# Patient Record
Sex: Female | Born: 1940 | Race: White | Hispanic: No | Smoking: Former smoker
Health system: Southern US, Community
[De-identification: ages and names within clinical notes are randomized; demographics above are authoritative.]

## PROBLEM LIST (undated history)

## (undated) DIAGNOSIS — I739 Peripheral vascular disease, unspecified: Secondary | ICD-10-CM

## (undated) DIAGNOSIS — J449 Chronic obstructive pulmonary disease, unspecified: Secondary | ICD-10-CM

## (undated) DIAGNOSIS — K5730 Diverticulosis of large intestine without perforation or abscess without bleeding: Secondary | ICD-10-CM

## (undated) DIAGNOSIS — K219 Gastro-esophageal reflux disease without esophagitis: Secondary | ICD-10-CM

## (undated) DIAGNOSIS — C50919 Malignant neoplasm of unspecified site of unspecified female breast: Secondary | ICD-10-CM

## (undated) DIAGNOSIS — I779 Disorder of arteries and arterioles, unspecified: Secondary | ICD-10-CM

## (undated) DIAGNOSIS — I509 Heart failure, unspecified: Secondary | ICD-10-CM

## (undated) DIAGNOSIS — Z803 Family history of malignant neoplasm of breast: Secondary | ICD-10-CM

## (undated) DIAGNOSIS — Z807 Family history of other malignant neoplasms of lymphoid, hematopoietic and related tissues: Secondary | ICD-10-CM

## (undated) DIAGNOSIS — Z5189 Encounter for other specified aftercare: Secondary | ICD-10-CM

## (undated) DIAGNOSIS — I471 Supraventricular tachycardia, unspecified: Secondary | ICD-10-CM

## (undated) DIAGNOSIS — Z801 Family history of malignant neoplasm of trachea, bronchus and lung: Secondary | ICD-10-CM

## (undated) DIAGNOSIS — R42 Dizziness and giddiness: Secondary | ICD-10-CM

## (undated) DIAGNOSIS — E785 Hyperlipidemia, unspecified: Secondary | ICD-10-CM

## (undated) DIAGNOSIS — F411 Generalized anxiety disorder: Secondary | ICD-10-CM

## (undated) DIAGNOSIS — Z800 Family history of malignant neoplasm of digestive organs: Secondary | ICD-10-CM

## (undated) DIAGNOSIS — M19049 Primary osteoarthritis, unspecified hand: Secondary | ICD-10-CM

## (undated) DIAGNOSIS — J439 Emphysema, unspecified: Secondary | ICD-10-CM

## (undated) DIAGNOSIS — J4489 Other specified chronic obstructive pulmonary disease: Secondary | ICD-10-CM

## (undated) DIAGNOSIS — Z923 Personal history of irradiation: Secondary | ICD-10-CM

## (undated) DIAGNOSIS — Z8719 Personal history of other diseases of the digestive system: Secondary | ICD-10-CM

## (undated) DIAGNOSIS — H269 Unspecified cataract: Secondary | ICD-10-CM

## (undated) DIAGNOSIS — Z8049 Family history of malignant neoplasm of other genital organs: Secondary | ICD-10-CM

## (undated) DIAGNOSIS — D649 Anemia, unspecified: Secondary | ICD-10-CM

## (undated) HISTORY — PX: OTHER SURGICAL HISTORY: SHX169

## (undated) HISTORY — DX: Family history of other malignant neoplasms of lymphoid, hematopoietic and related tissues: Z80.7

## (undated) HISTORY — PX: BREAST SURGERY: SHX581

## (undated) HISTORY — DX: Disorder of arteries and arterioles, unspecified: I77.9

## (undated) HISTORY — DX: Supraventricular tachycardia, unspecified: I47.10

## (undated) HISTORY — DX: Personal history of other diseases of the digestive system: Z87.19

## (undated) HISTORY — DX: Hyperlipidemia, unspecified: E78.5

## (undated) HISTORY — DX: Dizziness and giddiness: R42

## (undated) HISTORY — DX: Family history of malignant neoplasm of digestive organs: Z80.0

## (undated) HISTORY — DX: Malignant neoplasm of unspecified site of unspecified female breast: C50.919

## (undated) HISTORY — PX: ABDOMINAL HYSTERECTOMY: SHX81

## (undated) HISTORY — DX: Family history of malignant neoplasm of trachea, bronchus and lung: Z80.1

## (undated) HISTORY — DX: Heart failure, unspecified: I50.9

## (undated) HISTORY — DX: Other specified chronic obstructive pulmonary disease: J44.89

## (undated) HISTORY — PX: TONGUE SURGERY: SHX810

## (undated) HISTORY — DX: Unspecified cataract: H26.9

## (undated) HISTORY — DX: Personal history of irradiation: Z92.3

## (undated) HISTORY — PX: COSMETIC SURGERY: SHX468

## (undated) HISTORY — DX: Family history of malignant neoplasm of other genital organs: Z80.49

## (undated) HISTORY — DX: Generalized anxiety disorder: F41.1

## (undated) HISTORY — PX: CATARACT EXTRACTION: SUR2

## (undated) HISTORY — PX: APPENDECTOMY: SHX54

## (undated) HISTORY — DX: Diverticulosis of large intestine without perforation or abscess without bleeding: K57.30

## (undated) HISTORY — DX: Peripheral vascular disease, unspecified: I73.9

## (undated) HISTORY — DX: Chronic obstructive pulmonary disease, unspecified: J44.9

## (undated) HISTORY — DX: Emphysema, unspecified: J43.9

## (undated) HISTORY — DX: Gastro-esophageal reflux disease without esophagitis: K21.9

## (undated) HISTORY — DX: Encounter for other specified aftercare: Z51.89

## (undated) HISTORY — PX: HERNIA REPAIR: SHX51

## (undated) HISTORY — DX: Supraventricular tachycardia: I47.1

## (undated) HISTORY — DX: Anemia, unspecified: D64.9

## (undated) HISTORY — DX: Primary osteoarthritis, unspecified hand: M19.049

## (undated) HISTORY — DX: Family history of malignant neoplasm of breast: Z80.3

## (undated) HISTORY — PX: TONSILLECTOMY: SUR1361

## (undated) MED FILL — Sodium Chloride IV Soln 0.9%: INTRAVENOUS | Qty: 250 | Status: AC

## (undated) MED FILL — Magnesium Sulfate IV Soln 4 GM/100ML (40 MG/ML): INTRAVENOUS | Qty: 100 | Status: AC

---

## 1982-02-02 HISTORY — PX: OTHER SURGICAL HISTORY: SHX169

## 1987-02-03 HISTORY — PX: TMJ ARTHROPLASTY: SHX1066

## 1995-02-03 HISTORY — PX: OTHER SURGICAL HISTORY: SHX169

## 1999-01-14 ENCOUNTER — Other Ambulatory Visit: Admission: RE | Admit: 1999-01-14 | Discharge: 1999-01-14 | Payer: Self-pay | Admitting: Family Medicine

## 2000-06-03 ENCOUNTER — Other Ambulatory Visit: Admission: RE | Admit: 2000-06-03 | Discharge: 2000-06-03 | Payer: Self-pay | Admitting: Family Medicine

## 2000-07-07 ENCOUNTER — Encounter: Payer: Self-pay | Admitting: Gastroenterology

## 2001-02-02 HISTORY — PX: OTHER SURGICAL HISTORY: SHX169

## 2001-11-07 ENCOUNTER — Encounter: Payer: Self-pay | Admitting: Surgery

## 2001-11-07 ENCOUNTER — Encounter: Admission: RE | Admit: 2001-11-07 | Discharge: 2001-11-07 | Payer: Self-pay | Admitting: Surgery

## 2001-11-07 ENCOUNTER — Encounter (INDEPENDENT_AMBULATORY_CARE_PROVIDER_SITE_OTHER): Payer: Self-pay | Admitting: *Deleted

## 2001-11-14 ENCOUNTER — Encounter: Payer: Self-pay | Admitting: Surgery

## 2001-11-14 ENCOUNTER — Ambulatory Visit (HOSPITAL_BASED_OUTPATIENT_CLINIC_OR_DEPARTMENT_OTHER): Admission: RE | Admit: 2001-11-14 | Discharge: 2001-11-14 | Payer: Self-pay | Admitting: Surgery

## 2001-11-14 ENCOUNTER — Encounter: Admission: RE | Admit: 2001-11-14 | Discharge: 2001-11-14 | Payer: Self-pay | Admitting: Surgery

## 2001-11-15 ENCOUNTER — Encounter (INDEPENDENT_AMBULATORY_CARE_PROVIDER_SITE_OTHER): Payer: Self-pay | Admitting: *Deleted

## 2002-11-17 ENCOUNTER — Other Ambulatory Visit: Admission: RE | Admit: 2002-11-17 | Discharge: 2002-11-17 | Payer: Self-pay | Admitting: Obstetrics & Gynecology

## 2003-11-12 ENCOUNTER — Encounter: Payer: Self-pay | Admitting: Gastroenterology

## 2003-12-21 ENCOUNTER — Other Ambulatory Visit: Admission: RE | Admit: 2003-12-21 | Discharge: 2003-12-21 | Payer: Self-pay | Admitting: Obstetrics & Gynecology

## 2005-02-12 ENCOUNTER — Other Ambulatory Visit: Admission: RE | Admit: 2005-02-12 | Discharge: 2005-02-12 | Payer: Self-pay | Admitting: Obstetrics & Gynecology

## 2006-01-20 ENCOUNTER — Ambulatory Visit: Payer: Self-pay | Admitting: Internal Medicine

## 2006-02-02 DIAGNOSIS — K5730 Diverticulosis of large intestine without perforation or abscess without bleeding: Secondary | ICD-10-CM

## 2006-02-02 HISTORY — DX: Diverticulosis of large intestine without perforation or abscess without bleeding: K57.30

## 2006-02-02 HISTORY — PX: OTHER SURGICAL HISTORY: SHX169

## 2006-06-08 ENCOUNTER — Ambulatory Visit: Payer: Self-pay | Admitting: Internal Medicine

## 2006-06-08 LAB — CONVERTED CEMR LAB
ALT: 17 units/L (ref 0–40)
AST: 23 units/L (ref 0–37)
Albumin: 3.6 g/dL (ref 3.5–5.2)
Alkaline Phosphatase: 39 units/L (ref 39–117)
BUN: 20 mg/dL (ref 6–23)
Basophils Absolute: 0.1 10*3/uL (ref 0.0–0.1)
Basophils Relative: 0.9 % (ref 0.0–1.0)
Bilirubin Urine: NEGATIVE
Bilirubin, Direct: 0.1 mg/dL (ref 0.0–0.3)
CO2: 28 meq/L (ref 19–32)
Calcium: 8.9 mg/dL (ref 8.4–10.5)
Chloride: 109 meq/L (ref 96–112)
Cholesterol: 197 mg/dL (ref 0–200)
Creatinine, Ser: 0.8 mg/dL (ref 0.4–1.2)
Crystals: NEGATIVE
Eosinophils Absolute: 0.6 10*3/uL (ref 0.0–0.6)
Eosinophils Relative: 8.3 % — ABNORMAL HIGH (ref 0.0–5.0)
GFR calc Af Amer: 92 mL/min
GFR calc non Af Amer: 76 mL/min
Glucose, Bld: 105 mg/dL — ABNORMAL HIGH (ref 70–99)
HCT: 39.4 % (ref 36.0–46.0)
HDL: 74.8 mg/dL (ref >39.0)
Hemoglobin, Urine: NEGATIVE
Hemoglobin: 13.5 g/dL (ref 12.0–15.0)
Ketones, ur: NEGATIVE mg/dL
LDL Cholesterol: 104 mg/dL — ABNORMAL HIGH (ref 0–99)
Lymphocytes Relative: 35.6 % (ref 12.0–46.0)
MCHC: 34.1 g/dL (ref 30.0–36.0)
MCV: 88.9 fL (ref 78.0–100.0)
Monocytes Absolute: 0.6 10*3/uL (ref 0.2–0.7)
Monocytes Relative: 8.3 % (ref 3.0–11.0)
Mucus, UA: NEGATIVE
Neutro Abs: 3.1 10*3/uL (ref 1.4–7.7)
Neutrophils Relative %: 46.9 % (ref 43.0–77.0)
Nitrite: NEGATIVE
Platelets: 354 10*3/uL (ref 150–400)
Potassium: 4 meq/L (ref 3.5–5.1)
RBC / HPF: NONE SEEN
RBC: 4.43 M/uL (ref 3.87–5.11)
RDW: 12.8 % (ref 11.5–14.6)
Sodium: 142 meq/L (ref 135–145)
Specific Gravity, Urine: 1.025 (ref 1.000–1.03)
TSH: 2.28 microintl units/mL (ref 0.35–5.50)
Total Bilirubin: 0.6 mg/dL (ref 0.3–1.2)
Total CHOL/HDL Ratio: 2.6
Total Protein, Urine: NEGATIVE mg/dL
Total Protein: 6.6 g/dL (ref 6.0–8.3)
Triglycerides: 93 mg/dL (ref 0–149)
Urine Glucose: NEGATIVE mg/dL
Urobilinogen, UA: 0.2 (ref 0.0–1.0)
VLDL: 19 mg/dL (ref 0–40)
Vit D, 1,25-Dihydroxy: 40 (ref 20–57)
WBC: 6.8 10*3/uL (ref 4.5–10.5)
pH: 6 (ref 5.0–8.0)

## 2006-12-17 ENCOUNTER — Ambulatory Visit: Payer: Self-pay | Admitting: Internal Medicine

## 2006-12-17 ENCOUNTER — Encounter: Payer: Self-pay | Admitting: Internal Medicine

## 2006-12-17 DIAGNOSIS — R05 Cough: Secondary | ICD-10-CM | POA: Insufficient documentation

## 2006-12-17 DIAGNOSIS — Z8719 Personal history of other diseases of the digestive system: Secondary | ICD-10-CM | POA: Insufficient documentation

## 2006-12-17 DIAGNOSIS — K5730 Diverticulosis of large intestine without perforation or abscess without bleeding: Secondary | ICD-10-CM | POA: Insufficient documentation

## 2006-12-17 DIAGNOSIS — D630 Anemia in neoplastic disease: Secondary | ICD-10-CM | POA: Insufficient documentation

## 2006-12-17 DIAGNOSIS — R059 Cough, unspecified: Secondary | ICD-10-CM | POA: Insufficient documentation

## 2006-12-17 DIAGNOSIS — D649 Anemia, unspecified: Secondary | ICD-10-CM | POA: Insufficient documentation

## 2006-12-23 ENCOUNTER — Ambulatory Visit: Payer: Self-pay

## 2007-02-03 HISTORY — PX: OTHER SURGICAL HISTORY: SHX169

## 2007-03-07 LAB — CONVERTED CEMR LAB: Pap Smear: NORMAL

## 2007-05-23 ENCOUNTER — Emergency Department (HOSPITAL_COMMUNITY): Admission: EM | Admit: 2007-05-23 | Discharge: 2007-05-23 | Payer: Self-pay | Admitting: Emergency Medicine

## 2007-06-14 ENCOUNTER — Ambulatory Visit: Payer: Self-pay | Admitting: Internal Medicine

## 2007-06-14 DIAGNOSIS — R5383 Other fatigue: Secondary | ICD-10-CM

## 2007-06-14 DIAGNOSIS — E785 Hyperlipidemia, unspecified: Secondary | ICD-10-CM | POA: Insufficient documentation

## 2007-06-14 DIAGNOSIS — R5381 Other malaise: Secondary | ICD-10-CM | POA: Insufficient documentation

## 2007-06-14 LAB — CONVERTED CEMR LAB
AST: 23 units/L (ref 0–37)
Alkaline Phosphatase: 45 units/L (ref 39–117)
Bilirubin Urine: NEGATIVE
Bilirubin, Direct: 0.1 mg/dL (ref 0.0–0.3)
Chloride: 110 meq/L (ref 96–112)
Eosinophils Absolute: 0.3 10*3/uL (ref 0.0–0.7)
GFR calc Af Amer: 92 mL/min
GFR calc non Af Amer: 76 mL/min
Glucose, Bld: 100 mg/dL — ABNORMAL HIGH (ref 70–99)
HCT: 40.3 % (ref 36.0–46.0)
HDL: 57.8 mg/dL (ref >39.0)
Hemoglobin, Urine: NEGATIVE
Monocytes Absolute: 0.5 10*3/uL (ref 0.1–1.0)
Monocytes Relative: 7.2 % (ref 3.0–12.0)
Neutrophils Relative %: 59.5 % (ref 43.0–77.0)
Nitrite: NEGATIVE
Platelets: 322 10*3/uL (ref 150–400)
Potassium: 4.2 meq/L (ref 3.5–5.1)
RDW: 12.4 % (ref 11.5–14.6)
Sodium: 143 meq/L (ref 135–145)
Total CHOL/HDL Ratio: 3.6
Total Protein, Urine: NEGATIVE mg/dL
Triglycerides: 170 mg/dL — ABNORMAL HIGH (ref 0–149)
Urobilinogen, UA: 0.2 (ref 0.0–1.0)
VLDL: 34 mg/dL (ref 0–40)
WBC: 6.5 10*3/uL (ref 4.5–10.5)

## 2007-12-03 ENCOUNTER — Telehealth: Payer: Self-pay | Admitting: Family Medicine

## 2008-06-14 ENCOUNTER — Ambulatory Visit: Payer: Self-pay | Admitting: Internal Medicine

## 2008-06-14 DIAGNOSIS — J449 Chronic obstructive pulmonary disease, unspecified: Secondary | ICD-10-CM

## 2008-06-14 DIAGNOSIS — J4489 Other specified chronic obstructive pulmonary disease: Secondary | ICD-10-CM | POA: Insufficient documentation

## 2008-06-14 LAB — CONVERTED CEMR LAB
ALT: 18 units/L (ref 0–35)
Alkaline Phosphatase: 60 units/L (ref 39–117)
Basophils Relative: 0.6 % (ref 0.0–3.0)
Bilirubin, Direct: 0.1 mg/dL (ref 0.0–0.3)
CO2: 30 meq/L (ref 19–32)
Eosinophils Relative: 2.9 % (ref 0.0–5.0)
Glucose, Bld: 102 mg/dL — ABNORMAL HIGH (ref 70–99)
HDL: 66.1 mg/dL (ref >39.00)
Hemoglobin, Urine: NEGATIVE
Ketones, ur: NEGATIVE mg/dL
LDL Cholesterol: 102 mg/dL — ABNORMAL HIGH (ref 0–99)
Leukocytes, UA: NEGATIVE
Lymphocytes Relative: 30 % (ref 12.0–46.0)
Neutrophils Relative %: 60 % (ref 43.0–77.0)
Platelets: 284 10*3/uL (ref 150.0–400.0)
Potassium: 4.6 meq/L (ref 3.5–5.1)
RBC: 4.32 M/uL (ref 3.87–5.11)
Sodium: 141 meq/L (ref 135–145)
Specific Gravity, Urine: 1.02 (ref 1.000–1.030)
TSH: 1.02 microintl units/mL (ref 0.35–5.50)
Total Bilirubin: 0.5 mg/dL (ref 0.3–1.2)
Total CHOL/HDL Ratio: 3
Total Protein: 6.9 g/dL (ref 6.0–8.3)
Triglycerides: 120 mg/dL (ref 0.0–149.0)
Urine Glucose: NEGATIVE mg/dL
Urobilinogen, UA: 0.2 (ref 0.0–1.0)

## 2008-06-15 LAB — CONVERTED CEMR LAB: Vit D, 25-Hydroxy: 49 ng/mL

## 2008-10-23 ENCOUNTER — Ambulatory Visit: Payer: Self-pay | Admitting: Internal Medicine

## 2008-10-26 ENCOUNTER — Encounter: Payer: Self-pay | Admitting: Internal Medicine

## 2008-11-05 ENCOUNTER — Encounter: Payer: Self-pay | Admitting: Internal Medicine

## 2009-05-01 ENCOUNTER — Telehealth: Payer: Self-pay | Admitting: Internal Medicine

## 2009-05-03 ENCOUNTER — Ambulatory Visit: Payer: Self-pay | Admitting: Internal Medicine

## 2009-05-03 DIAGNOSIS — F411 Generalized anxiety disorder: Secondary | ICD-10-CM | POA: Insufficient documentation

## 2009-06-26 ENCOUNTER — Ambulatory Visit: Payer: Self-pay | Admitting: Internal Medicine

## 2009-06-26 DIAGNOSIS — M19049 Primary osteoarthritis, unspecified hand: Secondary | ICD-10-CM

## 2009-07-04 ENCOUNTER — Ambulatory Visit: Payer: Self-pay | Admitting: Internal Medicine

## 2009-07-04 LAB — CONVERTED CEMR LAB
AST: 21 units/L (ref 0–37)
Albumin: 3.6 g/dL (ref 3.5–5.2)
Alkaline Phosphatase: 45 units/L (ref 39–117)
Basophils Absolute: 0 10*3/uL (ref 0.0–0.1)
Bilirubin Urine: NEGATIVE
Bilirubin, Direct: 0 mg/dL (ref 0.0–0.3)
Calcium: 8.6 mg/dL (ref 8.4–10.5)
Eosinophils Absolute: 0.2 10*3/uL (ref 0.0–0.7)
GFR calc non Af Amer: 94.3 mL/min (ref >60)
HCT: 36.7 % (ref 36.0–46.0)
HDL: 72.6 mg/dL (ref >39.00)
Hemoglobin, Urine: NEGATIVE
Ketones, ur: NEGATIVE mg/dL
Leukocytes, UA: NEGATIVE
Lymphs Abs: 2.4 10*3/uL (ref 0.7–4.0)
MCV: 89.8 fL (ref 78.0–100.0)
Monocytes Absolute: 0.5 10*3/uL (ref 0.1–1.0)
Neutrophils Relative %: 48 % (ref 43.0–77.0)
Platelets: 288 10*3/uL (ref 150.0–400.0)
Potassium: 4.3 meq/L (ref 3.5–5.1)
RDW: 13.9 % (ref 11.5–14.6)
Sodium: 145 meq/L (ref 135–145)
Total Bilirubin: 0.3 mg/dL (ref 0.3–1.2)
Total CHOL/HDL Ratio: 3
Triglycerides: 132 mg/dL (ref 0.0–149.0)
VLDL: 26.4 mg/dL (ref 0.0–40.0)
WBC: 6.1 10*3/uL (ref 4.5–10.5)
pH: 7 (ref 5.0–8.0)

## 2009-07-05 ENCOUNTER — Encounter: Payer: Self-pay | Admitting: Internal Medicine

## 2009-07-08 ENCOUNTER — Encounter: Payer: Self-pay | Admitting: Internal Medicine

## 2009-07-08 ENCOUNTER — Telehealth: Payer: Self-pay | Admitting: Internal Medicine

## 2009-10-01 ENCOUNTER — Encounter: Payer: Self-pay | Admitting: Internal Medicine

## 2009-11-06 ENCOUNTER — Encounter: Payer: Self-pay | Admitting: Internal Medicine

## 2009-11-25 ENCOUNTER — Encounter: Payer: Self-pay | Admitting: Internal Medicine

## 2010-01-16 ENCOUNTER — Telehealth: Payer: Self-pay | Admitting: Gastroenterology

## 2010-03-04 NOTE — Letter (Signed)
Summary: [facility] [address]  [facility]   Imported By: Sherian Rein 10/08/2009 10:34:52  _____________________________________________________________________  External Attachment:    Type:   Image     Comment:   External Document

## 2010-03-04 NOTE — Medication Information (Signed)
Summary: Celebrex Approved/[employer]  Celebrex Approved/[employer]   Imported By: Sherian Rein 07/10/2009 10:55:21  _____________________________________________________________________  External Attachment:    Type:   Image     Comment:   External Document

## 2010-03-04 NOTE — Assessment & Plan Note (Signed)
Summary: yearly f/u ///MEDICARE/#/CD   Vital Signs:  Patient profile:   70 year old female Height:      63 inches Weight:      141.25 pounds BMI:     25.11 O2 Sat:      98 % on Room air Temp:     98.2 degrees F oral Pulse rate:   80 / minute BP sitting:   120 / 60  (left arm) Cuff size:   regular  Vitals Entered ByZella Ball Ewing (Jun 26, 2009 1:20 PM)  O2 Flow:  Room air  Preventive Care Screening  Last Tetanus Booster:    Date:  06/14/2008    Results:  Tdap      dxa per Dr neal/gyn; does not want colonoscpy now, but will after mother's illness (pancreatic cancer)  CC: Yearly/RE   CC:  Yearly/RE.  History of Present Illness: overall doing well, no specific complaint except for hand DJD pain without soft tissue swelling;  Pt denies CP, sob, doe, wheezing, orthopnea, pnd, worsening LE edema, palps, dizziness or syncope   Pt denies new neuro symptoms such as headache, facial or extremity weakness   Wants to blood work later.  Preventive Screening-Counseling & Management      Drug Use:  no.    Problems Prior to Update: 1)  Anxiety  (ICD-300.00) 2)  COPD  (ICD-496) 3)  Dyspnea  (ICD-786.05) 4)  Preventive Health Care  (ICD-V70.0) 5)  Preventive Health Care  (ICD-V70.0) 6)  Hyperlipidemia  (ICD-272.4) 7)  Fatigue  (ICD-780.79) 8)  Anemia-nos  (ICD-285.9) 9)  Diverticulosis, Colon  (ICD-562.10) 10)  Irritable Bowel Syndrome, Hx of  (ICD-V12.79) 11)  Cough  (ICD-786.2) 12)  Pre-operative Cardiovascular Examination  (ICD-V72.81)  Medications Prior to Update: 1)  Evista 60 Mg  Tabs (Raloxifene Hcl) .Marland Kitchen.. 1 By Mouth Once Daily 2)  Omeprazole 20 Mg  Cpdr (Omeprazole) .Marland Kitchen.. 1po Once Daily 3)  Calcium 600/vitamin D 600-400 Mg-Unit  Chew (Calcium Carbonate-Vitamin D) .Marland Kitchen.. 1 By Mouth Qd 4)  Ecotrin Low Strength 81 Mg  Tbec (Aspirin) .Marland Kitchen.. 1 By Mouth Qd 5)  Daily Multi   Tabs (Multiple Vitamins-Minerals) .Marland Kitchen.. 1 By Mouth Qd 6)  Miralax   Powd (Polyethylene Glycol 3350) .... Use  As Directed 7)  Fluticasone Propionate 50 Mcg/act  Susp (Fluticasone Propionate) .... 2 Spray/side Once Daily 8)  Ambien 10 Mg Tabs (Zolpidem Tartrate) .Marland Kitchen.. 1 At Bedtime As Needed  -  Please Make Return Office Visit For Further Refills 9)  Epipen 2-Pak 0.3 Mg/0.14ml (1:1000) Devi (Epinephrine Hcl (Anaphylaxis)) .... Use Asd 10)  Alprazolam 1 Mg Tabs (Alprazolam) .... 1/2 To 1 By Mouth Three Times A Day As Needed  Current Medications (verified): 1)  Evista 60 Mg  Tabs (Raloxifene Hcl) .Marland Kitchen.. 1 By Mouth Once Daily 2)  Omeprazole 20 Mg  Cpdr (Omeprazole) .Marland Kitchen.. 1po Once Daily 3)  Calcium 600/vitamin D 600-400 Mg-Unit  Chew (Calcium Carbonate-Vitamin D) .Marland Kitchen.. 1 By Mouth Qd 4)  Ecotrin Low Strength 81 Mg  Tbec (Aspirin) .Marland Kitchen.. 1 By Mouth Qd 5)  Daily Multi   Tabs (Multiple Vitamins-Minerals) .Marland Kitchen.. 1 By Mouth Qd 6)  Miralax   Powd (Polyethylene Glycol 3350) .... Use As Directed 7)  Fluticasone Propionate 50 Mcg/act  Susp (Fluticasone Propionate) .... 2 Spray/side Once Daily 8)  Ambien 10 Mg Tabs (Zolpidem Tartrate) .Marland Kitchen.. 1 At Bedtime As Needed 9)  Epipen 2-Pak 0.3 Mg/0.12ml (1:1000) Devi (Epinephrine Hcl (Anaphylaxis)) .... Use Asd 10)  Alprazolam 1 Mg  Tabs (Alprazolam) .... 1/2 To 1 By Mouth Three Times A Day As Needed 11)  Voltaren 1 % Gel (Diclofenac Sodium) .... Use Asd Two Times A Day As Needed 12)  Celebrex 200 Mg Caps (Celecoxib) .Marland Kitchen.. 1 By Mouth Two Times A Day As Needed  Allergies (verified): 1)  ! Codeine 2)  ! Cleocin (Clindamycin Hcl)  Past History:  Past Medical History: Last updated: 05/03/2009 Breast Cancer - 1997 - s/p cmt, xrt IBS Diverticulosis, colon - constipation Anemia-NOS Diverticulosis, colon Hyperlipidemia COPD Anxiety  Past Surgical History: Last updated: 06/14/2007 Tonsillectomy Appendectomy hx of rectal fissure Hysterectomy s/p knee surgury torn ligaments - 1982 Inguinal herniorrhaphy - left 1984 TMJ surgury - 1989 s/p lumpectomy 1997/malignant s/p benign  breast biopsy - 2003 s/p left foot surgury 2009 Spiral fx left foot 2008 - no surgury  Family History: Last updated: 06/14/2007 Brother with burkitt's lymphoma [place] Mother with uterine cancer ETOH dependence HTN stroke lung cancer mother with colon polyps  Social History: Last updated: 06/26/2009 Current Smoker Alcohol use-yes Divorced no children retired - Training and development officer Drug use-no  Risk Factors: Smoking Status: current (12/17/2006)  Social History: Reviewed history from 06/14/2007 and no changes required. Current Smoker Alcohol use-yes Divorced no children retired Office manager Drug use-no Drug Use:  no  Review of Systems  The patient denies anorexia, fever, weight loss, weight gain, vision loss, decreased hearing, hoarseness, chest pain, syncope, dyspnea on exertion, peripheral edema, prolonged cough, headaches, hemoptysis, abdominal pain, melena, hematochezia, severe indigestion/heartburn, hematuria, muscle weakness, suspicious skin lesions, transient blindness, difficulty walking, depression, unusual weight change, abnormal bleeding, enlarged lymph nodes, and angioedema.         all otherwise negative per pt -    Physical Exam  General:  alert and well-developed.   Head:  normocephalic and atraumatic.   Eyes:  vision grossly intact, pupils equal, and pupils round.   Ears:  R ear normal and L ear normal.   Nose:  no external deformity and no nasal discharge.   Mouth:  no gingival abnormalities and pharynx pink and moist.   Neck:  supple and no masses.   Lungs:  normal respiratory effort and normal breath sounds.   Heart:  normal rate and regular rhythm.   Abdomen:  soft, non-tender, and normal bowel sounds.   Msk:  no joint tenderness and no joint swelling.  ;' does have significant bony DIP and PIP joints of both hands Extremities:  no edema, no erythema  Neurologic:  cranial nerves II-XII intact and strength normal in all extremities.   Skin:  color normal  and no rashes.   Psych:  not anxious appearing and not depressed appearing.     Impression & Recommendations:  Problem # 1:  Preventive Health Care (ICD-V70.0)  Overall doing well, age appropriate education and counseling updated and referral for appropriate preventive services done unless declined, immunizations up to date or declined, diet counseling done if overweight, urged to quit smoking if smokes , most recent labs reviewed and current ordered if appropriate, ecg reviewed or declined (interpretation per ECG scanned in the EMR if done); information regarding Medicare Prevention requirements given if appropriate; speciality referrals updated as appropriate   Orders: EKG w/ Interpretation (93000)  Problem # 2:  OSTEOARTHRITIS, HAND (ICD-715.94)  Her updated medication list for this problem includes:    Ecotrin Low Strength 81 Mg Tbec (Aspirin) .Marland Kitchen... 1 by mouth qd    Celebrex 200 Mg Caps (Celecoxib) .Marland Kitchen... 1 by mouth two times a  day as needed treat as above, f/u any worsening signs or symptoms , as well as voltaren gel as needed   Complete Medication List: 1)  Evista 60 Mg Tabs (Raloxifene hcl) .Marland Kitchen.. 1 by mouth once daily 2)  Omeprazole 20 Mg Cpdr (Omeprazole) .Marland Kitchen.. 1po once daily 3)  Calcium 600/vitamin D 600-400 Mg-unit Chew (Calcium carbonate-vitamin d) .Marland Kitchen.. 1 by mouth qd 4)  Ecotrin Low Strength 81 Mg Tbec (Aspirin) .Marland Kitchen.. 1 by mouth qd 5)  Daily Multi Tabs (Multiple vitamins-minerals) .Marland Kitchen.. 1 by mouth qd 6)  Miralax Powd (Polyethylene glycol 3350) .... Use as directed 7)  Fluticasone Propionate 50 Mcg/act Susp (Fluticasone propionate) .... 2 spray/side once daily 8)  Ambien 10 Mg Tabs (Zolpidem tartrate) .Marland Kitchen.. 1 at bedtime as needed 9)  Epipen 2-pak 0.3 Mg/0.56ml (1:1000) Devi (Epinephrine hcl (anaphylaxis)) .... Use asd 10)  Alprazolam 1 Mg Tabs (Alprazolam) .... 1/2 to 1 by mouth three times a day as needed 11)  Voltaren 1 % Gel (Diclofenac sodium) .... Use asd two times a day as  needed 12)  Celebrex 200 Mg Caps (Celecoxib) .Marland Kitchen.. 1 by mouth two times a day as needed  Patient Instructions: 1)  please schedule your blood work at your convenience for sometime this wk or next - CPX labs -  v70.0 2)  please stop smoking 3)  please call when you want to schedule the colonoscopy 4)  Please take all new medications as prescribed 5)  Continue all previous medications as before this visit  6)  Please schedule a follow-up appointment in 1 year or sooner if needed Prescriptions: CELEBREX 200 MG CAPS (CELECOXIB) 1 by mouth two times a day as needed  #60 x 11   Entered and Authorized by:   [name] MD   Signed by:   [name] MD on 06/26/2009   Method used:   Print then Give to Patient   RxID:   [id number] VOLTAREN 1 % GEL (DICLOFENAC SODIUM) use asd two times a day as needed  #1 x 1   Entered and Authorized by:   [name] MD   Signed by:   [name] MD on 06/26/2009   Method used:   Print then Give to Patient   RxID:   [id number] EPIPEN 2-PAK 0.3 MG/0.3ML (1:1000) DEVI (EPINEPHRINE HCL (ANAPHYLAXIS)) use asd  #1 x 1   Entered and Authorized by:   [name] MD   Signed by:   [name] MD on 06/26/2009   Method used:   Print then Give to Patient   RxID:   [id number] AMBIEN 10 MG TABS (ZOLPIDEM TARTRATE) 1 at bedtime as needed  #30 x 5   Entered and Authorized by:   [name] MD   Signed by:   [name] MD on 06/26/2009   Method used:   Print then Give to Patient   RxID:   [id number] FLUTICASONE PROPIONATE 50 MCG/ACT  SUSP (FLUTICASONE PROPIONATE) 2 spray/side once daily  #3 x 3   Entered and Authorized by:   [name] MD   Signed by:   [name] MD on 06/26/2009   Method used:   Print then Give to Patient   RxID:   [id number] OMEPRAZOLE 20 MG  CPDR (OMEPRAZOLE) 1po once daily  #90 x 3   Entered and Authorized by:   [name] MD   Signed by:   [name] MD on 06/26/2009  Method used:   Print then Give  to Patient   RxID:   [id number] EVISTA 60 MG  TABS (RALOXIFENE HCL) 1 by mouth once daily  #90 x 3   Entered and Authorized by:   [name] MD   Signed by:   [name] MD on 06/26/2009   Method used:   Print then Give to Patient   RxID:   [id number]

## 2010-03-04 NOTE — Medication Information (Signed)
Summary: Prior [employer] for Celebrex/[employer]  Prior [employer] for Celebrex/[employer]   Imported By: Sherian Rein 07/09/2009 13:23:43  _____________________________________________________________________  External Attachment:    Type:   Image     Comment:   External Document

## 2010-03-04 NOTE — Progress Notes (Signed)
Summary: Celebrex approved  Phone Note From Pharmacy   Summary of Call: Celebrex prior authorization has been approved until 07/2012. Initial call taken by: Lucious Groves,  July 08, 2009 4:53 PM

## 2010-03-04 NOTE — Miscellaneous (Signed)
Summary: Immunization Entry   Immunization History:  Influenza Immunization History:    Influenza:  historical (11/23/2009)  [employer] [place], [place] Vaccine, Influenza Lot [id number] Exp. Date, 08/02/2010 Date Administered, 11/23/2009

## 2010-03-04 NOTE — Progress Notes (Signed)
Summary: Rx request  Phone Note Call from Patient Call back at Home Phone [phone]   Caller: Patient Summary of Call: pt left msg on triage requesting rx for Xananx. pt's last OV was 06/2008, and pt states she has an appt next month. EMR shows no record of pt recieving Xanax in the past, please advise. Initial call taken by: Margaret Pyle, CMA,  May 01, 2009 1:03 PM/ Sydell Axon SMA  Follow-up for Phone Call        I agree, the xanax is not a chronic medication due for refill; consider OV Follow-up by: [name] MD,  May 01, 2009 1:33 PM  Additional Follow-up for Phone Call Additional follow up Details #1::        spoke with pt, and informed her that she would need an OV, pt stated that she would call for an appt. Additional Follow-up by: Margaret Pyle, CMA,  May 01, 2009 1:46 PM/ Sydell Axon SMA

## 2010-03-04 NOTE — Assessment & Plan Note (Signed)
Summary: FU MEDS/ CPX SCHED'D FOR MAY/NWS   Vital Signs:  Patient profile:   70 year old female Height:      63 inches Weight:      142.25 pounds BMI:     25.29 O2 Sat:      98 % on Room air Temp:     98.2 degrees F oral Pulse rate:   87 / minute BP sitting:   124 / 68  (left arm) Cuff size:   regular  Vitals Entered ByZella Ball Ewing (May 03, 2009 3:34 PM)  O2 Flow:  Room air CC: Medication refills/RE   CC:  Medication refills/RE.  History of Present Illness: pt with mult recent increased stressors  - mother has pancreatic CA and DM, 69 yo, lives with pt; pt is sole caretaker; mother more unmanageable lately; mother sees dr Marina Goodell who is considering gallstones and has had to do every 3 wk blood transfusions on coumadin (now off);  has been to ER multiple times; ;  denies significant depressoin , low mood, but patience is about to the end; no suicidal ideation;  and no panic; requests xanax since that helped with a difficult period 20 yrs ago when she was involved in family stress and work pressure as well.  Pt denies CP, sob, doe, wheezing, orthopnea, pnd, worsening LE edema, palps, dizziness or syncope  Pt denies new neuro symptoms such as headache, facial or extremity weakness   Problems Prior to Update: 1)  Anxiety  (ICD-300.00) 2)  COPD  (ICD-496) 3)  Dyspnea  (ICD-786.05) 4)  Preventive Health Care  (ICD-V70.0) 5)  Preventive Health Care  (ICD-V70.0) 6)  Hyperlipidemia  (ICD-272.4) 7)  Fatigue  (ICD-780.79) 8)  Anemia-nos  (ICD-285.9) 9)  Diverticulosis, Colon  (ICD-562.10) 10)  Irritable Bowel Syndrome, Hx of  (ICD-V12.79) 11)  Cough  (ICD-786.2) 12)  Pre-operative Cardiovascular Examination  (ICD-V72.81)  Medications Prior to Update: 1)  Evista 60 Mg  Tabs (Raloxifene Hcl) .Marland Kitchen.. 1 By Mouth Once Daily 2)  Omeprazole 20 Mg  Cpdr (Omeprazole) .Marland Kitchen.. 1po Once Daily 3)  Calcium 600/vitamin D 600-400 Mg-Unit  Chew (Calcium Carbonate-Vitamin D) .Marland Kitchen.. 1 By Mouth Qd 4)  Ecotrin  Low Strength 81 Mg  Tbec (Aspirin) .Marland Kitchen.. 1 By Mouth Qd 5)  Daily Multi   Tabs (Multiple Vitamins-Minerals) .Marland Kitchen.. 1 By Mouth Qd 6)  Miralax   Powd (Polyethylene Glycol 3350) .... Use As Directed 7)  Fluticasone Propionate 50 Mcg/act  Susp (Fluticasone Propionate) .... 2 Spray/side Once Daily 8)  Ambien 10 Mg Tabs (Zolpidem Tartrate) .Marland Kitchen.. 1 At Bedtime As Needed  -  Please Make Return Office Visit For Further Refills 9)  Epipen 2-Pak 0.3 Mg/0.68ml (1:1000) Devi (Epinephrine Hcl (Anaphylaxis)) .... Use Asd  Current Medications (verified): 1)  Evista 60 Mg  Tabs (Raloxifene Hcl) .Marland Kitchen.. 1 By Mouth Once Daily 2)  Omeprazole 20 Mg  Cpdr (Omeprazole) .Marland Kitchen.. 1po Once Daily 3)  Calcium 600/vitamin D 600-400 Mg-Unit  Chew (Calcium Carbonate-Vitamin D) .Marland Kitchen.. 1 By Mouth Qd 4)  Ecotrin Low Strength 81 Mg  Tbec (Aspirin) .Marland Kitchen.. 1 By Mouth Qd 5)  Daily Multi   Tabs (Multiple Vitamins-Minerals) .Marland Kitchen.. 1 By Mouth Qd 6)  Miralax   Powd (Polyethylene Glycol 3350) .... Use As Directed 7)  Fluticasone Propionate 50 Mcg/act  Susp (Fluticasone Propionate) .... 2 Spray/side Once Daily 8)  Ambien 10 Mg Tabs (Zolpidem Tartrate) .Marland Kitchen.. 1 At Bedtime As Needed  -  Please Make Return Office Visit For Further  Refills 9)  Epipen 2-Pak 0.3 Mg/0.27ml (1:1000) Devi (Epinephrine Hcl (Anaphylaxis)) .... Use Asd 10)  Alprazolam 1 Mg Tabs (Alprazolam) .... 1/2 To 1 By Mouth Three Times A Day As Needed  Allergies (verified): 1)  ! Codeine 2)  ! Cleocin (Clindamycin Hcl)  Past History:  Past Surgical History: Last updated: 06/14/2007 Tonsillectomy Appendectomy hx of rectal fissure Hysterectomy s/p knee surgury torn ligaments - 1982 Inguinal herniorrhaphy - left 1984 TMJ surgury - 1989 s/p lumpectomy 1997/malignant s/p benign breast biopsy - 2003 s/p left foot surgury 2009 Spiral fx left foot 2008 - no surgury  Social History: Last updated: 06/14/2007 Current Smoker Alcohol use-yes Divorced no children retired - [employer]  Risk  Factors: Smoking Status: current (12/17/2006)  Past Medical History: Breast Cancer - 1997 - s/p cmt, xrt IBS Diverticulosis, colon - constipation Anemia-NOS Diverticulosis, colon Hyperlipidemia COPD Anxiety  Review of Systems       all otherwise negative per pt -    Physical Exam  General:  alert and well-developed.   Head:  normocephalic and atraumatic.   Eyes:  vision grossly intact, pupils equal, and pupils round.   Ears:  R ear normal and L ear normal.   Nose:  no external deformity and no nasal discharge.   Mouth:  no gingival abnormalities and pharynx pink and moist.   Neck:  supple and no masses.   Lungs:  normal respiratory effort and normal breath sounds.   Heart:  normal rate and regular rhythm.   Extremities:  no edema, no erythema    Impression & Recommendations:  Problem # 1:  COPD (ICD-496) stable overall by hx and exam, ok to continue meds/tx as is   Problem # 2:  ANXIETY (ICD-300.00)  ok for xanax as needed , for situational exacerbation;  hospice is involved, to seek counseling with them and has opportuinites for respite as well   Her updated medication list for this problem includes:    Alprazolam 1 Mg Tabs (Alprazolam) .Marland Kitchen... 1/2 to 1 by mouth three times a day as needed  Complete Medication List: 1)  Evista 60 Mg Tabs (Raloxifene hcl) .Marland Kitchen.. 1 by mouth once daily 2)  Omeprazole 20 Mg Cpdr (Omeprazole) .Marland Kitchen.. 1po once daily 3)  Calcium 600/vitamin D 600-400 Mg-unit Chew (Calcium carbonate-vitamin d) .Marland Kitchen.. 1 by mouth qd 4)  Ecotrin Low Strength 81 Mg Tbec (Aspirin) .Marland Kitchen.. 1 by mouth qd 5)  Daily Multi Tabs (Multiple vitamins-minerals) .Marland Kitchen.. 1 by mouth qd 6)  Miralax Powd (Polyethylene glycol 3350) .... Use as directed 7)  Fluticasone Propionate 50 Mcg/act Susp (Fluticasone propionate) .... 2 spray/side once daily 8)  Ambien 10 Mg Tabs (Zolpidem tartrate) .Marland Kitchen.. 1 at bedtime as needed  -  please make return office visit for further refills 9)  Epipen 2-pak  0.3 Mg/0.43ml (1:1000) Devi (Epinephrine hcl (anaphylaxis)) .... Use asd 10)  Alprazolam 1 Mg Tabs (Alprazolam) .... 1/2 to 1 by mouth three times a day as needed  Patient Instructions: 1)  Please take all new medications as prescribed 2)  Continue all previous medications as before this visit  3)  Please schedule a follow-up appointment in 1 month as planned Prescriptions: ALPRAZOLAM 1 MG TABS (ALPRAZOLAM) 1/2 to 1 by mouth three times a day as needed  #90 x 1   Entered and Authorized by:   [name] MD   Signed by:   [name] MD on 05/03/2009   Method used:   Print then Give to Patient  RxID:   [id number] ALPRAZOLAM 1 MG TABS (ALPRAZOLAM) 21/2 to 1 by mouth three times a day as needed  #90 x 1   Entered and Authorized by:   [name] MD   Signed by:   [name] MD on 05/03/2009   Method used:   Print then Give to Patient   RxID:   [id number]

## 2010-03-06 NOTE — Progress Notes (Signed)
Summary: doctor switch request  Phone Note Call from Patient Call back at Home Phone [phone]   Caller: Patient Call For: Dr. Jarold Motto Reason for Call: Talk to Nurse Summary of Call: pt would like to switch from Dr. Jarold Motto to Dr. Marina Goodell... reason being that she "likeas Dr. Marina Goodell" and has been around him a lot because her mother, Darnelle Spangle, is a pt of Dr. Lamar Sprinkles... nothing nagative about Dr. Jarold Motto... told pt that there is a chance Dr. Jarold Motto will not accept her back as a pt if Dr. Marina Goodell does not accept, which is also a possibility    Initial call taken by: Vallarie Mare,  January 16, 2010 1:33 PM  Follow-up for Phone Call        Jarold Motto do you approve of this patient switching to Dr. Marina Goodell? Please advise. Selinda Michaels RN  January 17, 2010 9:14 AM  Additional Follow-up for Phone Call Additional follow up Details #1::        yes.... Additional Follow-up by: Mardella Layman MD FACG,  January 17, 2010 11:15 AM    Additional Follow-up for Phone Call Additional follow up Details #2::    Dr. Marina Goodell will you see this patient that wants to switch from Dr. Jarold Motto to you? Dr. Jarold Motto is ok with this.  Follow-up by: Selinda Michaels RN,  January 17, 2010 11:19 AM  Additional Follow-up for Phone Call Additional follow up Details #3:: Details for Additional Follow-up Action Taken: ok w/ me. Hilarie Fredrickson MD  January 17, 2010 1:41 PM    Additional Follow-up by: Mardella Layman MD Clementeen Graham,  January 17, 2010 2:15 PM      Appended Document: doctor switch request Patient given appointment with Dr. Marina Goodell for 02/24/10 @ 2:45pm.

## 2010-03-28 ENCOUNTER — Encounter: Payer: Self-pay | Admitting: Internal Medicine

## 2010-03-28 ENCOUNTER — Ambulatory Visit (INDEPENDENT_AMBULATORY_CARE_PROVIDER_SITE_OTHER): Payer: Medicare Other | Admitting: Internal Medicine

## 2010-03-28 DIAGNOSIS — Z1211 Encounter for screening for malignant neoplasm of colon: Secondary | ICD-10-CM

## 2010-03-28 DIAGNOSIS — K5900 Constipation, unspecified: Secondary | ICD-10-CM

## 2010-03-28 DIAGNOSIS — K219 Gastro-esophageal reflux disease without esophagitis: Secondary | ICD-10-CM | POA: Insufficient documentation

## 2010-04-01 NOTE — Assessment & Plan Note (Signed)
Summary:  Establish with Dr. Marina Goodell ( GERD, IBS, screening )   History of Present Illness Visit Type: Initial Visit Primary GI MD: Yancey Flemings MD Primary Provider: Oliver Barre, MD Chief Complaint: Patient here to discuss having a recall colonoscopy. She denies any current GI symptoms. History of Present Illness:    70 year old female with a history of COPD, tobacco abuse, anxiety, hyperlipidemia, breast cancer, and constipation predominant irritable bowel syndrome. she has chronic constipation and uses MiraLax. She has had prior colonoscopy with Dr. Jarold Motto in 2002 and again in 2005. The examinations were normal with excellent preparation. The colon was said to be very tortuous and redundant. She presents today regarding the need for a routine followup colonoscopy. No family history of colon cancer in any first-degree relative. No GI complaints. She does take omeprazole for acid reflux. Good control of symptoms. Review of blood work from June 2011 findings a normal CBC and comprehensive metabolic panel with TSH.   GI Review of Systems    Reports acid reflux.      Denies abdominal pain, belching, bloating, chest pain, dysphagia with liquids, dysphagia with solids, heartburn, loss of appetite, nausea, vomiting, vomiting blood, weight loss, and  weight gain.      Reports constipation.     Denies anal fissure, black tarry stools, change in bowel habit, diarrhea, diverticulosis, fecal incontinence, heme positive stool, hemorrhoids, irritable bowel syndrome, jaundice, light color stool, liver problems, rectal bleeding, and  rectal pain. Preventive Screening-Counseling & Management  Caffeine-Diet-Exercise     Does Patient Exercise: yes      Drug Use:  no.      Current Medications (verified): 1)  Evista 60 Mg  Tabs (Raloxifene Hcl) .Marland Kitchen.. 1 By Mouth Once Daily 2)  Omeprazole 20 Mg  Cpdr (Omeprazole) .Marland Kitchen.. 1po Once Daily 3)  Calcium 600/vitamin D 600-400 Mg-Unit  Chew (Calcium Carbonate-Vitamin D)  .Marland Kitchen.. 1 By Mouth Qd 4)  Ecotrin Low Strength 81 Mg  Tbec (Aspirin) .Marland Kitchen.. 1 By Mouth Qd 5)  Daily Multi   Tabs (Multiple Vitamins-Minerals) .Marland Kitchen.. 1 By Mouth Qd 6)  Miralax   Powd (Polyethylene Glycol 3350) .... Take 1 Capful Dissolved in Water/juice and Drink Once Nightly. 7)  Fluticasone Propionate 50 Mcg/act  Susp (Fluticasone Propionate) .... 2 Spray/side Once Daily As Needed 8)  Ambien 10 Mg Tabs (Zolpidem Tartrate) .Marland Kitchen.. 1 At Bedtime As Needed 9)  Epipen 2-Pak 0.3 Mg/0.74ml (1:1000) Devi (Epinephrine Hcl (Anaphylaxis)) .... Use Asd 10)  Celebrex 200 Mg Caps (Celecoxib) .Marland Kitchen.. 1 By Mouth Two Times A Day As Needed 11)  Sudafed 30 Mg Tabs (Pseudoephedrine Hcl) .... Take 1 Tablet By Mouth Once Daily  Allergies (verified): 1)  ! Codeine 2)  ! Cleocin (Clindamycin Hcl)  Past History:  Past Medical History: Reviewed history from 05/03/2009 and no changes required. Breast Cancer - 1997 - s/p cmt, xrt IBS Diverticulosis, colon - constipation Anemia-NOS Diverticulosis, colon Hyperlipidemia COPD Anxiety  Past Surgical History: Tonsillectomy Appendectomy Rectal fissure repair Hysterectomy s/p right knee surgery torn ligaments - 1982 Inguinal herniorrhaphy - left 1984 TMJ surgery - 1989 s/p lumpectomy 1997/malignant-left x 2 s/p benign breast biopsy - 2003-right s/p left foot surgery 2009 Spiral fx left foot 2008 - no surgery  Family History: Brother with burkitt's lymphoma [place] Mother with endometrial cancer ETOH dependence HTN stroke lung cancer mother with colon polyps Family History of Pancreatic Cancer: Mother Family History of Clotting disorder: Mother Family History of Colon Polyps: Mother Family History of Diabetes: Mother  Social History: Current Smoker-1 ppd Alcohol use-yes-rare Divorced no 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 Illicit Drug Use - no Does Patient Exercise:  yes  Review of Systems       The patient  complains of arthritis/joint pain, back pain, depression-new, shortness of breath, and sleeping problems.  The patient denies allergy/sinus, anemia, anxiety-new, blood in urine, breast changes/lumps, change in vision, confusion, cough, coughing up blood, fainting, fatigue, fever, headaches-new, hearing problems, heart murmur, heart rhythm changes, itching, menstrual pain, muscle pains/cramps, night sweats, nosebleeds, pregnancy symptoms, skin rash, sore throat, swelling of feet/legs, swollen lymph glands, thirst - excessive , urination - excessive , urination changes/pain, urine leakage, vision changes, and voice change.    Vital Signs:  Patient profile:   70 year old female Height:      63 inches Weight:      139 pounds BMI:     24.71 BSA:     1.66 Pulse rate:   80 / minute Pulse rhythm:   regular BP sitting:   102 / 56  (left arm)  Vitals Entered By: Lamona Curl CMA (AAMA) (March 28, 2010 11:13 AM)  Physical Exam  General:  Well developed, well nourished, no acute distress. Head:  Normocephalic and atraumatic. Eyes:  PERRLA, no icterus. Mouth:  No deformity or lesions. Neck:  Supple; no masses or thyromegaly. Lungs:  Clear throughout to auscultation. Heart:  Regular rate and rhythm; no murmurs, rubs,  or bruits. Abdomen:  Soft, nontender and nondistended. No masses, hepatosplenomegaly or hernias noted. Normal bowel sounds. Msk:  Symmetrical with no gross deformities. Normal posture. Pulses:  Normal pulses noted. Extremities:  No edema or deformities noted. Neurologic:  Alert and  oriented x4. Skin:  Intact without significant lesions or rashes. Psych:  Alert and cooperative. Normal mood and affect.   Impression & Recommendations:  Problem # 1:  CONSTIPATION (ICD-564.00)  constipation predominant irritable bowel. doing well with MiraLax. continue MiraLax when necessary  Problem # 2:  GERD (ICD-530.81)  doing well on  omeprazole. Continue PPI and reflux  precautions  Problem # 3:  SPECIAL SCREENING FOR MALIGNANT NEOPLASMS COLON (ICD-V76.51)  discuss the current guidelines for screening colonoscopy. patient would not be due for routine followup until around October 2015. an appropriate recall has been entered into the computer.  Patient Instructions: 1)  We will change your colonoscopy recall to October 2015.  You will receive a letter reminding you to schedule at that time.  2)  Copy sent to : Oliver Barre, MD 3)  The medication list was reviewed and reconciled.  All changed / newly prescribed medications were explained.  A complete medication list was provided to the patient / caregiver.

## 2010-04-01 NOTE — Procedures (Signed)
Summary: Colonoscopy   Colonoscopy  Procedure date:  11/12/2003  Findings:      Location:  [facility].  Results: Diverticulosis.        Patient Name: Eulia, Hatcher MRN:  Procedure Procedures: Colonoscopy CPT: 04540.  Personnel: Endoscopist: Vania Rea. Jarold Motto, MD.  Exam Location: Exam performed in Outpatient Clinic. Outpatient  Patient Consent: Procedure, Alternatives, Risks and Benefits discussed, consent obtained, from patient. Consent was obtained by the RN.  Indications Symptoms: Constipation Patient's stools are infrequent. Patient has difficulty evacuating, strains with stool passage.  Comments: Hx. of recurrent rectal fissures. History  Current Medications: Patient is not currently taking Coumadin.  Pre-Exam Physical: Performed Nov 12, 2003. Cardio-pulmonary exam, Rectal exam, Abdominal exam, Extremity exam, Mental status exam WNL.  Exam Exam: Extent of exam reached: Cecum, extent intended: Cecum.  The cecum was identified by appendiceal orifice and IC valve. Patient position: on left side. Duration of exam: 20 minutes. Colon retroflexion performed. Images taken. ASA Classification: I. Tolerance: fair, adequate exam.  Monitoring: Pulse and BP monitoring, Oximetry used. Supplemental O2 given. at 2 Liters.  Colon Prep Used Golytely for colon prep. Prep results: excellent.  Sedation Meds: Patient assessed and found to be appropriate for moderate (conscious) sedation. Fentanyl 75 mcg. given IV. Versed 7 mg. given IV.  Instrument(s): CF 140L. Serial [id number].  Findings - DIVERTICULOSIS: Descending Colon to Sigmoid Colon. Not bleeding. ICD9: Diverticulosis, Colon: 562.10. Comments: Very tortuous and redundant left colon noted.  - NORMAL EXAM: Cecum to Rectum. Not Seen: Polyps. AVM's. Colitis. Tumors. Crohn's.  - NORMAL EXAM: Rectum. Crohn's. Hemorrhoids. Comments: Prominant perianal papillae noted.   Assessment  Diagnoses: 562.10:  Diverticulosis, Colon.   Comments: Chronic functional connstipation. Events  Unplanned Interventions: No intervention was required.  Plans Medication Plan: Anti-constipation: Zelnorm 6mg . BID, starting Nov 12, 2003 for 4 wks.   Patient Education: Patient given standard instructions for: Diverticulosis. High fiber diet.  Disposition: After procedure patient sent to recovery. After recovery patient sent home.  Scheduling/Referral: Clinic Visit, to Vania Rea. Jarold Motto, MD, around Dec 13, 2003.    This report was created from the original endoscopy report, which was reviewed and signed by the above listed endoscopist.   cc: Konrad Dolores, MD

## 2010-04-01 NOTE — Procedures (Signed)
Summary: Colonoscopy   Colonoscopy  Procedure date:  07/07/2000  Findings:      Results: Normal. Location:  [facility].    Colonoscopy  Procedure date:  07/07/2000  Findings:      Results: Normal. Location:  [facility].   Patient Name: Stacey [name], Stacey [name] MRN:  Procedure Procedures: Colonoscopy CPT: 24401.  Personnel: Endoscopist: Vania Rea. Jarold Motto, MD.  Referred By: Rogene Houston, MD.  Exam Location: Exam performed in Outpatient Clinic. Outpatient  Patient Consent: Procedure, Alternatives, Risks and Benefits discussed, consent obtained, from patient.  Indications  Average Risk Screening Routine.  History  Pre-Exam Physical: Performed Jul 07, 2000. Cardio-pulmonary exam, Rectal exam, Abdominal exam, Extremity exam, Mental status exam WNL.  Exam Exam: Extent of exam reached: Cecum, extent intended: Cecum.  The cecum was identified by appendiceal orifice and IC valve. Patient position: on left side. Duration of exam: 20 minutes. Colon retroflexion performed. Images taken. ASA Classification: I. Tolerance: good.  Monitoring: Pulse and BP monitoring, Oximetry used. Supplemental O2 given.  Colon Prep Used Golytely for colon prep. Prep results: excellent.  Sedation Meds: Patient assessed and found to be appropriate for moderate (conscious) sedation. Fentanyl 100 mcg. Versed 5 mg.  Instrument(s): PCF 100. Serial [id number]. Serial #0.  Findings - OTHER FINDING: Descending Colon. Comments: Very tortuous and redundant colon.  - NORMAL EXAM: Cecum to Ascending Colon. Not Seen: Polyps. Tumors. Crohn's.   Assessment Normal examination.  Events  Unplanned Interventions: No intervention was required.  Plans Medication Plan: Continue current medications.  Disposition: After procedure patient sent to recovery. After recovery patient sent home.  Scheduling/Referral: Follow-Up prn.   This report was created from the original endoscopy  report, which was reviewed and signed by the above listed endoscopist.   cc:  Dianna Limbo, MD

## 2010-05-04 ENCOUNTER — Other Ambulatory Visit: Payer: Self-pay | Admitting: Internal Medicine

## 2010-06-20 NOTE — Op Note (Signed)
[name], [name]                            ACCOUNT NO.:  [id number]   MEDICAL RECORD NO.:  [id number]                   PATIENT TYPE:  AMB   LOCATION:  [facility]                                  FACILITY:  [facility]   PHYSICIAN:  Currie Paris, M.D.           DATE OF BIRTH:  March 06, 1940   DATE OF PROCEDURE:  11/14/2001  DATE OF DISCHARGE:                                 OPERATIVE REPORT   [facility]S [phone].   PREOPERATIVE DIAGNOSIS:  Right breast mass.   POSTOPERATIVE DIAGNOSIS:  Right breast mass.   PROCEDURE:  Needle-guided excision of right breast mass.   SURGEON:  Currie Paris, M.D.   ANESTHESIA:  General.   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  She had core biopsies of both areas, and they were adjacent  to each other.  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  The concern was raised that this was not a typical sclerosing adenosis, and  excisional biopsy was suggested.   DESCRIPTION OF PROCEDURE:  The patient was seen in the holding area and had  no further questions.  She already had a guidewire placed in her breast, and  this was identified as well as she had her breast marked.  She had no  further questions.  She was taken to the operating room and after  satisfactory general anesthesia had been obtained, the breast was prepped  and draped.  The guidewire entered directly laterally and tracked medially.  There was an X between the guidewire entry site and the areolar margin,  indicating the area that the mass was over.  I elected to make a radial  incision starting at the guidewire and going medially toward the nipple.  I  divided a little of the subcutaneous tissue  until I got down to what  appeared to be breast tissue and then grasping the breast tissue with some  Allis clamps so that I was around the guidewire.  I elevated this up into  the wound and using cutting current of the cautery went around the  guidewire.  I saw what appeared to be biopsy tracks but no definite discrete  mass, and I tracked completely around the guidewire and went all the way  down to the chest wall and close to the nipple-areolar area as well.  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  A distinct  tumor could not be felt around the guidewire site, and the fibroadenoma was  just a little inferior to the guidewire and a little bit anterior.  The specimen was sent for specimen mammography.  Bleeders were controlled  with coagulation current of the cautery.  Marcaine 0.25% plain was used to  help with postoperative analgesia.  The breast was closed in layers with 3-0  Vicryl, followed by 4-0 Monocryl subcuticular and Steri-Strips.   Radiology reported that the worrisome lesion was contained in the specimen  for sure, and the secondary lesion was thought to be in there but was more  difficult to see because of her dense fibrotic breast tissue around it.  Nevertheless, I palpated the breast prior to closing and could not feel any  other abnormalities, so I felt that we did not need to do anything further  at this time.   Sterile dressings were applied.  The patient was awakened and returned to  the recovery room in satisfactory condition.  There were no operative  complications.  All counts were correct.                                               Currie Paris, M.D.    CJS/MEDQ  D:  11/14/2001  T:  11/15/2001  Job:  [id number]   cc:   Stacey [name]., M.D.   Stacey [name], M.D.  [address] [facility]  [place]  [place] [address]  Fax: [phone]   [facility]   [facility]

## 2010-07-07 ENCOUNTER — Other Ambulatory Visit: Payer: Self-pay | Admitting: Internal Medicine

## 2010-07-29 ENCOUNTER — Encounter: Payer: Self-pay | Admitting: Internal Medicine

## 2010-07-29 ENCOUNTER — Other Ambulatory Visit: Payer: Self-pay | Admitting: Internal Medicine

## 2010-07-29 ENCOUNTER — Ambulatory Visit (INDEPENDENT_AMBULATORY_CARE_PROVIDER_SITE_OTHER): Payer: Medicare Other | Admitting: Internal Medicine

## 2010-07-29 ENCOUNTER — Other Ambulatory Visit (INDEPENDENT_AMBULATORY_CARE_PROVIDER_SITE_OTHER): Payer: Medicare Other

## 2010-07-29 VITALS — BP 116/68 | HR 90 | Temp 97.9°F | Ht 63.0 in | Wt 144.0 lb

## 2010-07-29 DIAGNOSIS — L989 Disorder of the skin and subcutaneous tissue, unspecified: Secondary | ICD-10-CM

## 2010-07-29 DIAGNOSIS — R5381 Other malaise: Secondary | ICD-10-CM

## 2010-07-29 DIAGNOSIS — R202 Paresthesia of skin: Secondary | ICD-10-CM | POA: Insufficient documentation

## 2010-07-29 DIAGNOSIS — R209 Unspecified disturbances of skin sensation: Secondary | ICD-10-CM

## 2010-07-29 DIAGNOSIS — E785 Hyperlipidemia, unspecified: Secondary | ICD-10-CM

## 2010-07-29 DIAGNOSIS — F411 Generalized anxiety disorder: Secondary | ICD-10-CM

## 2010-07-29 DIAGNOSIS — R5383 Other fatigue: Secondary | ICD-10-CM

## 2010-07-29 DIAGNOSIS — Z0000 Encounter for general adult medical examination without abnormal findings: Secondary | ICD-10-CM | POA: Insufficient documentation

## 2010-07-29 LAB — CBC WITH DIFFERENTIAL/PLATELET
Basophils Absolute: 0 10*3/uL (ref 0.0–0.1)
Eosinophils Absolute: 0.2 10*3/uL (ref 0.0–0.7)
MCHC: 34.9 g/dL (ref 30.0–36.0)
MCV: 89.5 fl (ref 78.0–100.0)
Monocytes Absolute: 0.7 10*3/uL (ref 0.1–1.0)
Neutrophils Relative %: 57.9 % (ref 43.0–77.0)
Platelets: 311 10*3/uL (ref 150.0–400.0)
RDW: 13.6 % (ref 11.5–14.6)
WBC: 8.1 10*3/uL (ref 4.5–10.5)

## 2010-07-29 LAB — TSH: TSH: 1.18 u[IU]/mL (ref 0.35–5.50)

## 2010-07-29 LAB — BASIC METABOLIC PANEL
BUN: 17 mg/dL (ref 6–23)
Chloride: 108 mEq/L (ref 96–112)
Potassium: 4.4 mEq/L (ref 3.5–5.1)

## 2010-07-29 LAB — URINALYSIS, ROUTINE W REFLEX MICROSCOPIC
Bilirubin Urine: NEGATIVE
Hgb urine dipstick: NEGATIVE
Ketones, ur: NEGATIVE
Leukocytes, UA: NEGATIVE
Nitrite: NEGATIVE

## 2010-07-29 LAB — HEPATIC FUNCTION PANEL
ALT: 16 U/L (ref 0–35)
AST: 24 U/L (ref 0–37)
Alkaline Phosphatase: 54 U/L (ref 39–117)
Bilirubin, Direct: 0.1 mg/dL (ref 0.0–0.3)
Total Bilirubin: 0.4 mg/dL (ref 0.3–1.2)

## 2010-07-29 LAB — LIPID PANEL: VLDL: 48.2 mg/dL — ABNORMAL HIGH (ref 0.0–40.0)

## 2010-07-29 MED ORDER — ZOLPIDEM TARTRATE 10 MG PO TABS: 10.0000 mg | ORAL_TABLET | Freq: Every evening | ORAL | Status: DC | PRN

## 2010-07-29 MED ORDER — OMEPRAZOLE 20 MG PO CPDR: 20.0000 mg | DELAYED_RELEASE_CAPSULE | Freq: Every day | ORAL | Status: DC

## 2010-07-29 NOTE — Assessment & Plan Note (Addendum)
LUE  - ? CTS vs ulnar - for left wrist splint for now, consider hand surgury if not improved

## 2010-07-29 NOTE — Progress Notes (Signed)
Subjective:    Patient ID: Stacey [name], female    DOB: Sep 10, 1940, 70 y.o.   MRN: [id number]  HPI  Here for f/u;  Overall doing ok;  Pt denies CP, worsening SOB, DOE, wheezing, orthopnea, PND, worsening LE edema, palpitations, dizziness or syncope.  Pt denies neurological change such as new Headache, facial or extremity weakness.  Pt denies polydipsia, polyuria, or low sugar symptoms. Pt states overall good compliance with treatment and medications, good tolerability, and trying to follow lower cholesterol diet.  Pt denies worsening depressive symptoms, suicidal ideation or panic. No fever, wt loss, night sweats, loss of appetite, or other constitutional symptoms.  Pt states good ability with ADL's, low fall risk, home safety reviewed and adequate, no significant changes in hearing or vision, and occasionally active with exercise.Marland Kitchen  Has several skin lesions to leg lesions.  Does have some numbness to the tips of the fingers, with occasional pain and numbness to the elbows, worse to carry heavy purse on the left shoulder.  Does have sense of ongoing fatigue, but denies signficant hypersomnolence. Denies worsening depressive symptoms, suicidal ideation, or panic, though has ongoing anxiety, not increased recently.    Past Medical History  Diagnosis Date  . ANEMIA-NOS 12/17/2006  . ANXIETY 05/03/2009  . CONSTIPATION 03/28/2010  . COPD 06/14/2008  . Cough 12/17/2006  . DIVERTICULOSIS, COLON 12/17/2006  . DYSPNEA 06/14/2008  . FATIGUE 06/14/2007  . GERD 03/28/2010  . HYPERLIPIDEMIA 06/14/2007  . IRRITABLE BOWEL SYNDROME, HX OF 12/17/2006  . OSTEOARTHRITIS, HAND 06/26/2009   Past Surgical History  Procedure Date  . Appendectomy   . Tonsillectomy   . Rectal fissure repair   . Abdominal hysterectomy   . Inguinal herniorrhapy 1984    left  . Tmj arthroplasty 1989  . S/p lumpectomy 1997    melignant left x 2  . S/p benign breast biopsy 2003    right  . S/p left foot surgury 2009  . Spiral fx left  foot 2008    no surgury    reports that she has been smoking.  She does not have any smokeless tobacco history on file. She reports that she drinks alcohol. She reports that she does not use illicit drugs. family history includes Alcohol abuse in her mother; Cancer in her mother; Colon polyps in her mother; Diabetes in her mother; Hypertension in her mother; Lymphoma in her brother; and Stroke in her mother. Allergies  Allergen Reactions  . Codeine     REACTION: nausea   Current Outpatient Prescriptions on File Prior to Visit  Medication Sig Dispense Refill  . aspirin 81 MG EC tablet Take 81 mg by mouth daily.        . Calcium Carbonate-Vit D-Min (CALCIUM 600+D PLUS MINERALS) 600-400 MG-UNIT CHEW Chew by mouth daily.        . celecoxib (CELEBREX) 200 MG capsule Take 200 mg by mouth daily.        Marland Kitchen EPINEPHrine (EPIPEN) 0.3 mg/0.3 mL DEVI Use as directed       . EVISTA 60 MG tablet take 1 tablet by mouth once daily  30 tablet  3  . fluticasone (FLONASE) 50 MCG/ACT nasal spray instill 2 sprays into each nostril once daily  16 g  3  . Multiple Vitamin (MULTIVITAMIN) capsule Take 1 capsule by mouth daily.        Marland Kitchen omeprazole (PRILOSEC) 20 MG capsule take 1 capsule by mouth once daily  30 capsule  0  . polyethylene glycol  powder (MIRALAX) powder Take 17 g by mouth daily.        Marland Kitchen zolpidem (AMBIEN) 10 MG tablet Take 10 mg by mouth at bedtime as needed.        . pseudoephedrine (SUDAFED) 30 MG tablet Take 30 mg by mouth daily.         Review of Systems Review of Systems  Constitutional: Negative for diaphoresis, activity change, appetite change and unexpected weight change.  HENT: Negative for hearing loss, ear pain, facial swelling, mouth sores and neck stiffness.   Eyes: Negative for pain, redness and visual disturbance.  Respiratory: Negative for shortness of breath and wheezing.   Cardiovascular: Negative for chest pain and palpitations.  Gastrointestinal: Negative for diarrhea, blood in  stool, abdominal distention and rectal pain.  Genitourinary: Negative for hematuria, flank pain and decreased urine volume.  Musculoskeletal: Negative for myalgias and joint swelling.  Skin: Negative for color change and wound.  Neurological: Negative for syncope and numbness.  Hematological: Negative for adenopathy.  Psychiatric/Behavioral: Negative for hallucinations, self-injury, decreased concentration and agitation.      Objective:   Physical Exam BP 116/68  Pulse 90  Temp(Src) 97.9 F (36.6 C) (Oral)  Ht 5\' 3"  (1.6 m)  Wt 144 lb (65.318 kg)  BMI 25.51 kg/m2  SpO2 96% Physical Exam  VS noted Constitutional: Pt is oriented to person, place, and time. Appears well-developed and well-nourished.  HENT:  Head: Normocephalic and atraumatic.  Right Ear: External ear normal.  Left Ear: External ear normal.  Nose: Nose normal.  Mouth/Throat: Oropharynx is clear and moist.  Eyes: Conjunctivae and EOM are normal. Pupils are equal, round, and reactive to light.  Neck: Normal range of motion. Neck supple. No JVD present. No tracheal deviation present.  Cardiovascular: Normal rate, regular rhythm, normal heart sounds and intact distal pulses.   Pulmonary/Chest: Effort normal and breath sounds normal.  Abdominal: Soft. Bowel sounds are normal. There is no tenderness.  Musculoskeletal: Normal range of motion. Exhibits no edema.  Lymphadenopathy:  Has no cervical adenopathy.  Neurological: Pt is alert and oriented to person, place, and time. Pt has normal reflexes. No cranial nerve deficit.  Skin: Skin is warm and dry. No rash noted. several dark lesions to left leg Psychiatric:  . Behavior is normal. 1-2+ nervous        Assessment & Plan:

## 2010-07-29 NOTE — Patient Instructions (Addendum)
Please check with your supplement insurance about the shingles shot Please go to LAB in the Basement for the blood and/or urine tests to be done today Please call the phone number [phone] (the PhoneTree System) for results of testing in 2-3 days;  When calling, simply dial the number, and when prompted enter the MRN number above (the Medical Record Number) and the # key, then the message should start. You will be contacted regarding the referral for: dermatology Please quit smoking Continue all other medications as before Please return in 1 year for your yearly visit, or sooner if needed

## 2010-07-29 NOTE — Assessment & Plan Note (Signed)
stable overall by hx and exam, most recent data reviewed with pt, and pt to continue medical treatment as before  Lab Results  Component Value Date   WBC 6.1 07/04/2009   HGB 12.8 07/04/2009   HCT 36.7 07/04/2009   PLT 288.0 07/04/2009   CHOL 183 07/04/2009   TRIG 132.0 07/04/2009   HDL 72.60 07/04/2009   LDLDIRECT 114.2 06/14/2007   ALT 16 07/04/2009   AST 21 07/04/2009   NA 145 07/04/2009   K 4.3 07/04/2009   CL 111 07/04/2009   CREATININE 0.7 07/04/2009   BUN 21 07/04/2009   CO2 29 07/04/2009   TSH 1.06 07/04/2009

## 2010-07-29 NOTE — Assessment & Plan Note (Signed)
Lab Results  Component Value Date   LDLCALC 84 07/04/2009   stable overall by hx and exam, most recent data reviewed with pt, and pt to continue medical treatment as before

## 2010-07-29 NOTE — Assessment & Plan Note (Signed)
Etiology unclear, Exam otherwise benign, to check labs as documented, follow with expectant management  Lab Results  Component Value Date   WBC 6.1 07/04/2009   HGB 12.8 07/04/2009   HCT 36.7 07/04/2009   PLT 288.0 07/04/2009   CHOL 183 07/04/2009   TRIG 132.0 07/04/2009   HDL 72.60 07/04/2009   LDLDIRECT 114.2 06/14/2007   ALT 16 07/04/2009   AST 21 07/04/2009   NA 145 07/04/2009   K 4.3 07/04/2009   CL 111 07/04/2009   CREATININE 0.7 07/04/2009   BUN 21 07/04/2009   CO2 29 07/04/2009   TSH 1.06 07/04/2009

## 2010-07-29 NOTE — Assessment & Plan Note (Signed)
Several lesions, ? Atypical in appearance;  Also one on left breast - for derm referral for full body check

## 2010-08-03 ENCOUNTER — Other Ambulatory Visit: Payer: Self-pay | Admitting: Internal Medicine

## 2010-08-04 ENCOUNTER — Ambulatory Visit (HOSPITAL_COMMUNITY)
Admission: RE | Admit: 2010-08-04 | Discharge: 2010-08-04 | Disposition: A | Discharge status: Home / Self Care | Payer: Medicare Other | Source: Ambulatory Visit | Attending: Orthopedic Surgery | Admitting: Orthopedic Surgery

## 2010-08-04 ENCOUNTER — Other Ambulatory Visit (HOSPITAL_COMMUNITY): Payer: Self-pay | Admitting: Orthopedic Surgery

## 2010-08-04 DIAGNOSIS — Z01818 Encounter for other preprocedural examination: Secondary | ICD-10-CM

## 2010-09-07 ENCOUNTER — Other Ambulatory Visit: Payer: Self-pay | Admitting: Internal Medicine

## 2010-09-16 ENCOUNTER — Other Ambulatory Visit: Payer: Self-pay | Admitting: Internal Medicine

## 2010-10-20 ENCOUNTER — Telehealth: Payer: Self-pay

## 2010-10-20 NOTE — Telephone Encounter (Signed)
Flu shot given 10/18/2010 Left Arm Exp. 05/05/2011 Lot#[id number] VIS 08/04/2010 Pharmacy [facility] [place]

## 2010-11-13 ENCOUNTER — Encounter: Payer: Self-pay | Admitting: Internal Medicine

## 2010-11-14 ENCOUNTER — Other Ambulatory Visit: Payer: Self-pay | Admitting: Radiology

## 2010-11-14 HISTORY — PX: BREAST BIOPSY: SHX20

## 2010-11-17 ENCOUNTER — Other Ambulatory Visit: Payer: Self-pay | Admitting: Radiology

## 2010-11-17 DIAGNOSIS — C50911 Malignant neoplasm of unspecified site of right female breast: Secondary | ICD-10-CM

## 2010-11-18 ENCOUNTER — Encounter: Payer: Self-pay | Admitting: Internal Medicine

## 2010-11-20 ENCOUNTER — Other Ambulatory Visit: Payer: Medicare Other

## 2010-11-25 ENCOUNTER — Ambulatory Visit
Admission: RE | Admit: 2010-11-25 | Discharge: 2010-11-25 | Disposition: A | Discharge status: Home / Self Care | Payer: Medicare Other | Source: Ambulatory Visit | Attending: Radiology | Admitting: Radiology

## 2010-11-25 DIAGNOSIS — C50911 Malignant neoplasm of unspecified site of right female breast: Secondary | ICD-10-CM

## 2010-11-25 MED ORDER — GADOBENATE DIMEGLUMINE 529 MG/ML IV SOLN
13.0000 mL | Freq: Once | INTRAVENOUS | Status: AC | PRN
  Administered 2010-11-25: 13 mL via INTRAVENOUS

## 2010-11-28 ENCOUNTER — Ambulatory Visit (INDEPENDENT_AMBULATORY_CARE_PROVIDER_SITE_OTHER): Payer: Medicare Other | Admitting: Surgery

## 2010-11-28 ENCOUNTER — Encounter (INDEPENDENT_AMBULATORY_CARE_PROVIDER_SITE_OTHER): Payer: Self-pay | Admitting: Surgery

## 2010-11-28 VITALS — BP 140/82 | HR 84 | Temp 97.3°F | Resp 16 | Ht 63.0 in | Wt 149.6 lb

## 2010-11-28 DIAGNOSIS — C50911 Malignant neoplasm of unspecified site of right female breast: Secondary | ICD-10-CM

## 2010-11-28 DIAGNOSIS — C50919 Malignant neoplasm of unspecified site of unspecified female breast: Secondary | ICD-10-CM

## 2010-11-28 NOTE — Patient Instructions (Signed)
Neoadjuvant treatment vs. Surgery first ??  To see Dr. Truett Perna and radiation oncologist.

## 2010-11-28 NOTE — Progress Notes (Addendum)
ASSESSMENT AND PLAN: 1.  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  Patient of Dr. Leonard Schwartz. [name].  (Patient called and wants to see one of the breast oncologist.  Discussed with Kaylyn Lim.  DN 12/03/2010)  Saw Dr. Dan Humphreys last time.  I discussed the options for breast cancer treatment with the patient.  I discussed the idea of a multidisciplinary approach to the treatment of breast cancer, which often includes medical oncology and radiation oncology.  The treatment plan depends on the pathologic staging of the tumor and the patient's personal wishes.  The risks of surgery include, but are not limited to, bleeding, infection, the need for further surgery, and nerve injury.  Will need needle loc and SLNBx.  The patient has been given literature on the treatment of breast cancer.  I gave a copy of path to patient.  To decide between neoadjuvant therapy vs surgery first.  I want her to see medical and radiation oncology prior to making decision.   [I spoke to patient on phone and answered questions.  She saw Drs. Rubin/Manning and is taking [name].  We will use this as neoadjuvant for 6 to 12 months.  I told her I want to see her q3 months.  12/22/2010  DN]  [Dave:  I saw this pt today and you see her later this wk. Her most recent u/s of the breast suggests a decrease in her breast mass, but clinically it seems larger. I would appreciate your thoughts and I scheduled her for a MRI before I see her in 6 wks.  Peter  03/10/2011]  2.  Left breast cancer - 1997.  Treated with lumpectomy, Axillary node dissection by Dr. Ivor Reining.  I discussed with the patient if she wanted Streck handling this cancer, but she is going to stay with me.  She last saw Streck in 01/22/2003.  3.  Anxiety. 4.  Hyperlipidemia. 5.  Just quit smoking. 6.  COPD. 7.  Arthritis, on Celebrex. 8.  Left posterior tooth that has undergone a root canal, but is going to be removed.  Decisions about tooth.  She sees Dr. Cherlyn Cushing.  REFERRING PHYSICIAN: Oliver Barre, MD, MD  HISTORY OF PRESENT ILLNESS: Stacey [name] is a 70 y.o. (DOB: [birth year])  white female whose primary care physician is Oliver Barre, MD, MD and comes to me today for new right breast cancer.  The patient had a left breast cancer Dr. Cicero Duck in 1997. She underwent chemotherapy supervised by Dr. Mancel Bale and has done well since that time.  She had a recent mammogram, ultrasound that shows a mass in the upper inner aspect of her right breast. Biopsy showed invasive carcinoma with lobular features. She did not notice any new changes in her breast, though she says her breast are always lumpy.  Presented to Breast Ca Conf on 10/24:  Had left breast ca in 1997. 2 cm mass in right breast on [place], but on mammogram poorly seen because of density of breast tissue.  MRI shows 2.2 cm right breast mass Lobular features.    ER/PR pos, Ki67 - 20%, Her2Neu - neg  Candidate for lumpectomy up front or neo adj tx, prob with AI.   Past Medical History  Diagnosis Date  . ANEMIA-NOS 12/17/2006  . ANXIETY 05/03/2009  . CONSTIPATION 03/28/2010  . COPD 06/14/2008  . Cough 12/17/2006  . DIVERTICULOSIS, COLON 12/17/2006  . DYSPNEA 06/14/2008  . FATIGUE 06/14/2007  . GERD  03/28/2010  . HYPERLIPIDEMIA 06/14/2007  . IRRITABLE BOWEL SYNDROME, HX OF 12/17/2006  . 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 Cancer     Past Surgical History  Procedure Date  . Appendectomy   . Tonsillectomy   . Rectal fissure repair   . Abdominal hysterectomy   . Inguinal herniorrhapy 1984    left  . Tmj arthroplasty 1989  . S/p lumpectomy 1997    melignant left x 2  . S/p benign breast biopsy 2003    right  . S/p left foot surgury 2009  . Spiral fx left foot 2008    no surgury  . Breast surgery     lumpectomy    Current Outpatient Prescriptions  Medication Sig Dispense Refill  . aspirin 81 MG EC tablet Take 81 mg by mouth daily.        . Calcium Carbonate-Vit D-Min (CALCIUM  600+D PLUS MINERALS) 600-400 MG-UNIT CHEW Chew by mouth daily.        . CELEBREX 200 MG capsule take 1 capsule by mouth twice a day if needed  60 capsule  1  . EPINEPHrine (EPIPEN) 0.3 mg/0.3 mL DEVI Use as directed       . EVISTA 60 MG tablet take 1 tablet by mouth once daily  30 tablet  11  . fluticasone (FLONASE) 50 MCG/ACT nasal spray instill 2 sprays into each nostril once daily  16 g  3  . Loratadine (CLARITIN PO) Take by mouth.        . Multiple Vitamin (MULTIVITAMIN) capsule Take 1 capsule by mouth daily.        Marland Kitchen omeprazole (PRILOSEC) 20 MG capsule Take 1 capsule (20 mg total) by mouth daily.  90 capsule  3  . polyethylene glycol powder (GLYCOLAX/MIRALAX) powder use as directed  527 g  8  . zolpidem (AMBIEN) 10 MG tablet Take 1 tablet (10 mg total) by mouth at bedtime as needed.  30 tablet  5    Allergies  Allergen Reactions  . Clindamycin/Lincomycin   . Codeine     REACTION: nausea    REVIEW OF SYSTEMS: Skin:  No history of rash.  No history of abnormal moles. Infection:  No history of hepatitis or HIV.  No history of MRSA. Neurologic:  No history of stroke.  No history of seizure.  No history of headaches. Cardiac:  No history of hypertension. No history of heart disease.  No history of prior cardiac catheterization.  No history of seeing a cardiologist. Pulmonary:  Smoke cigarettes. Quit 1 week ago.  COPD.  Endocrine:  No diabetes. No thyroid disease. Gastrointestinal:  No history of stomach disease.  No history of liver disease.  No history of gall bladder disease.  No history of pancreas disease.  No history of colon disease. Sees Dr. Kizzie Furnish.  Last colonoscopy 2004, for next one in 2014. Urologic:  No history of kidney stones.  No history of bladder infections. GYN:  Sees Dr. Renato Shin. Musculoskeletal:  Arthritis.  Takes Celebrex for this. Hematologic:  No bleeding disorder.  No history of anemia.  Not anticoagulated. Psycho-social:  The patient is oriented.   The  patient has no obvious psychologic or social impairment to understanding our conversation and plan. Dental:  Left posterior tooth trouble.  SOCIAL and FAMILY HISTORY: Mother of Blondell Reveal (?). [name] with her. I took care of her mother, Darnelle Spangle.  PHYSICAL EXAM: BP 140/82  Pulse 84  Temp(Src) 97.3 F (36.3 C) (Temporal)  Resp  16  Ht 5\' 3"  (1.6 m)  Wt 149 lb 9.6 oz (67.858 kg)  BMI 26.50 kg/m2  General: WN WF  who is alert and generally healthy appearing.  HEENT: Normal. Pupils equal. Good dentition. Neck: Supple. No mass.  No thyroid mass.  Carotid pulse okay with no bruit. Lymph Nodes:  No supraclavicular or cervical nodes. Lungs: Clear to auscultation and symmetric breath sounds. Heart:  RRR. No murmur or rub. Breasts:  Bruise right breast, UIQ.  Mass, about 2 cm, in that area. Abdomen: Soft. No mass. No tenderness. No hernia. Normal bowel sounds.  No abdominal scars. Rectal: Not done. Extremities:  Good strength and ROM  in upper and lower extremities. Neurologic:  Grossly intact to motor and sensory function. Psychiatric: Has normal mood and affect. Behavior is normal.   DATA REVIEWED: Mammogram/MRI.  Presented at Breast Cancer Conf - 11/26/2010. Dr. Truett Perna is out of the office today.  I spoke to his nurse.  Ovidio Kin, M.D., [facility], [place] [phone]

## 2010-12-01 ENCOUNTER — Other Ambulatory Visit: Payer: Self-pay | Admitting: Internal Medicine

## 2010-12-10 ENCOUNTER — Other Ambulatory Visit: Payer: Self-pay | Admitting: Oncology

## 2010-12-10 ENCOUNTER — Ambulatory Visit: Payer: Medicare Other

## 2010-12-10 ENCOUNTER — Ambulatory Visit (HOSPITAL_BASED_OUTPATIENT_CLINIC_OR_DEPARTMENT_OTHER): Payer: Medicare Other

## 2010-12-10 ENCOUNTER — Ambulatory Visit (HOSPITAL_BASED_OUTPATIENT_CLINIC_OR_DEPARTMENT_OTHER): Payer: Medicare Other | Admitting: Oncology

## 2010-12-10 ENCOUNTER — Other Ambulatory Visit: Payer: Self-pay | Admitting: *Deleted

## 2010-12-10 VITALS — BP 153/80 | HR 74 | Temp 97.9°F | Ht 63.0 in | Wt 150.3 lb

## 2010-12-10 DIAGNOSIS — Z853 Personal history of malignant neoplasm of breast: Secondary | ICD-10-CM

## 2010-12-10 DIAGNOSIS — E559 Vitamin D deficiency, unspecified: Secondary | ICD-10-CM

## 2010-12-10 DIAGNOSIS — C50919 Malignant neoplasm of unspecified site of unspecified female breast: Secondary | ICD-10-CM

## 2010-12-10 DIAGNOSIS — C50119 Malignant neoplasm of central portion of unspecified female breast: Secondary | ICD-10-CM

## 2010-12-10 DIAGNOSIS — Z170 Estrogen receptor positive status [ER+]: Secondary | ICD-10-CM

## 2010-12-10 LAB — CBC WITH DIFFERENTIAL/PLATELET
Basophils Absolute: 0 10*3/uL (ref 0.0–0.1)
EOS%: 4 % (ref 0.0–7.0)
Eosinophils Absolute: 0.3 10*3/uL (ref 0.0–0.5)
HCT: 37.1 % (ref 34.8–46.6)
HGB: 12.6 g/dL (ref 11.6–15.9)
MCH: 29.9 pg (ref 25.1–34.0)
NEUT#: 4.5 10*3/uL (ref 1.5–6.5)
NEUT%: 58.2 % (ref 38.4–76.8)
RDW: 14 % (ref 11.2–14.5)
lymph#: 2.2 10*3/uL (ref 0.9–3.3)

## 2010-12-10 LAB — COMPREHENSIVE METABOLIC PANEL
Albumin: 3.5 g/dL (ref 3.5–5.2)
Alkaline Phosphatase: 66 U/L (ref 39–117)
BUN: 15 mg/dL (ref 6–23)
CO2: 28 mEq/L (ref 19–32)
Calcium: 9.4 mg/dL (ref 8.4–10.5)
Chloride: 103 mEq/L (ref 96–112)
Glucose, Bld: 94 mg/dL (ref 70–99)
Potassium: 3.9 mEq/L (ref 3.5–5.3)
Sodium: 139 mEq/L (ref 135–145)
Total Protein: 6.9 g/dL (ref 6.0–8.3)

## 2010-12-10 LAB — CANCER ANTIGEN 27.29: CA 27.29: 56 U/mL — ABNORMAL HIGH (ref 0–39)

## 2010-12-11 ENCOUNTER — Encounter: Payer: Self-pay | Admitting: Radiation Oncology

## 2010-12-11 ENCOUNTER — Ambulatory Visit: Payer: Medicare Other | Admitting: Radiation Oncology

## 2010-12-11 ENCOUNTER — Ambulatory Visit: Payer: Medicare Other

## 2010-12-11 ENCOUNTER — Encounter: Payer: Self-pay | Admitting: *Deleted

## 2010-12-11 ENCOUNTER — Ambulatory Visit
Admission: RE | Admit: 2010-12-11 | Discharge: 2010-12-11 | Disposition: A | Discharge status: Home / Self Care | Payer: Medicare Other | Source: Ambulatory Visit | Attending: Radiation Oncology | Admitting: Radiation Oncology

## 2010-12-11 DIAGNOSIS — Z79899 Other long term (current) drug therapy: Secondary | ICD-10-CM | POA: Insufficient documentation

## 2010-12-11 DIAGNOSIS — J4489 Other specified chronic obstructive pulmonary disease: Secondary | ICD-10-CM | POA: Insufficient documentation

## 2010-12-11 DIAGNOSIS — C50919 Malignant neoplasm of unspecified site of unspecified female breast: Secondary | ICD-10-CM | POA: Insufficient documentation

## 2010-12-11 DIAGNOSIS — C50911 Malignant neoplasm of unspecified site of right female breast: Secondary | ICD-10-CM

## 2010-12-11 DIAGNOSIS — J449 Chronic obstructive pulmonary disease, unspecified: Secondary | ICD-10-CM | POA: Insufficient documentation

## 2010-12-11 DIAGNOSIS — E785 Hyperlipidemia, unspecified: Secondary | ICD-10-CM | POA: Insufficient documentation

## 2010-12-11 DIAGNOSIS — Z7982 Long term (current) use of aspirin: Secondary | ICD-10-CM | POA: Insufficient documentation

## 2010-12-11 DIAGNOSIS — K219 Gastro-esophageal reflux disease without esophagitis: Secondary | ICD-10-CM | POA: Insufficient documentation

## 2010-12-11 DIAGNOSIS — K589 Irritable bowel syndrome without diarrhea: Secondary | ICD-10-CM | POA: Insufficient documentation

## 2010-12-11 NOTE — Progress Notes (Signed)
Referral MD Kandis Cocking  [phone]    Reason for Referral: Breast cancer   Chief Complaint  Patient presents with  . Breast Cancer  : With previous history of breast cancer 15 years ago  HPI: This patient was diagnosed with breast cancer of the left breast approximately 15 years ago. She was treated by lumpectomy by Dr. Chase Caller followed by 4 cycles of [facility] chemotherapy and then radiation therapy to the left breast given to her by Dr. [name]. He was then given 5 years of tamoxifen which she tolerated well. He has been on regular followup with both Drs. Newman and as well as Dr. Truett Perna. sHe has undergone screening mammography on an annual basis. She did not detect any abnormalities of the right breast. She underwent screening mammogram on 11/10/2003 which detected an abnormality in the posterior subareolar region. Additional views as well as ultrasound showed a 2 cm lobulated hypoechoic mass in the central portion of the breast. This was clinically palpable. A biopsy was performed on 11/14/2010 and this showed a lobular type cancer which appeared to be grade 2. This was ER and PR positive at 85 and 50% percent respectively. Proliferative index was 20% HER-2 was nonamplified. An MRI was performed on 11/25/2010. The right breast had a 2 point 2 x 2 by 1.8 cm mass in the upper inner quadrant of the right breast, the other breast was normal. Agent has been seen by Dr. Ezzard Standing who in turn has referred her more consideration of neoadjuvant chemotherapy     HPI:  Past Medical History  Diagnosis Date  . ANEMIA-NOS 12/17/2006  . ANXIETY 05/03/2009  . CONSTIPATION 03/28/2010  . COPD 06/14/2008  . Cough 12/17/2006  . DIVERTICULOSIS, COLON 12/17/2006  . DYSPNEA 06/14/2008  . FATIGUE 06/14/2007  . GERD 03/28/2010  . HYPERLIPIDEMIA 06/14/2007  . IRRITABLE BOWEL SYNDROME, HX OF 12/17/2006  . 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 Cancer   :  Past Surgical History  Procedure Date  . Appendectomy   .  Tonsillectomy   . Rectal fissure repair   . Abdominal hysterectomy   . Inguinal herniorrhapy 1984    left  . Tmj arthroplasty 1989  . S/p lumpectomy 1997    melignant left x 2  . S/p benign breast biopsy 2003    right  . S/p left foot surgury 2009  . Spiral fx left foot 2008    no surgury  . Breast surgery     lumpectomy  :  Current outpatient prescriptions:aspirin 81 MG EC tablet, Take 81 mg by mouth daily.  , Disp: , Rfl: ;  Calcium Carbonate-Vit D-Min (CALCIUM 600+D PLUS MINERALS) 600-400 MG-UNIT CHEW, Chew by mouth daily.  , Disp: , Rfl: ;  CELEBREX 200 MG capsule, take 1 capsule by mouth twice a day if needed, Disp: 60 capsule, Rfl: 8;  EPINEPHrine (EPIPEN) 0.3 mg/0.3 mL DEVI, Use as directed , Disp: , Rfl:  EVISTA 60 MG tablet, take 1 tablet by mouth once daily, Disp: 30 tablet, Rfl: 11;  fluticasone (FLONASE) 50 MCG/ACT nasal spray, instill 2 sprays into each nostril once daily, Disp: 16 g, Rfl: 3;  Loratadine (CLARITIN PO), Take by mouth.  , Disp: , Rfl: ;  Multiple Vitamin (MULTIVITAMIN) capsule, Take 1 capsule by mouth daily.  , Disp: , Rfl:  zolpidem (AMBIEN) 10 MG tablet, Take 1 tablet (10 mg total) by mouth at bedtime as needed., Disp: 30 tablet, Rfl: 5;  omeprazole (PRILOSEC) 20 MG capsule, Take 1 capsule (20  mg total) by mouth daily., Disp: 90 capsule, Rfl: 3;  polyethylene glycol powder (GLYCOLAX/MIRALAX) powder, use as directed, Disp: 527 g, Rfl: 8:    :  Allergies  Allergen Reactions  . Clindamycin/Lincomycin   . Codeine     REACTION: nausea  :  Family History  Problem Relation Age of Onset  . Alcohol abuse Mother     ETOH dependence  . Hypertension Mother   . Stroke Mother   . Colon polyps Mother   . Cancer Mother     endometrial , lung and pancreatic cancer  . Diabetes Mother   . Lymphoma Brother     burkitts  . Pancreatic cancer Mother   . Breast cancer Cousin     1 maternal, 2 paternal  . Lymphoma Brother     bburketts  :  History   Social  History  . Marital Status: Divorced    Spouse Name: N/A    Number of Children: N/A  . Years of Education: N/A   Occupational History  . retired Training and development officer    Social History Main Topics  . Smoking status: Current Everyday Smoker  . Smokeless tobacco: Not on file   Comment: 1 ppd  . Alcohol Use: Yes     rare  . Drug Use: No  . Sexually Active: Not on file   Other Topics Concern  . Not on file   Social History Narrative   Patient gets no regular exercise  :  Pertinent items are noted in HPI.  Exam:  General appearance: alert, appears stated age and no distress Head and neck exam; normal no peripheral adenopathy him a no intraoral lesions  Chest exam normal heart sounds and breath sounds, no Rales or rhonchi and no murmurs.  Breast exam 3-4 cm mass located central portion of the right breast, no nipple retraction or other skin changes. The left breast is normal. Both axillae negative for  adenopathy .  Abdomen; normal no organomegaly or masses  Extremities; normal                 Basename 12/10/10 0943  WBC --  HGB 12.6  HCT 37.1  PLT 218    Basename 12/10/10 0941  NA 139  K 3.9  CL 103  CO2 28  GLUCOSE 94  BUN 15  CREATININE 0.66  CALCIUM 9.4    Blood smear review:   Pathology:GII, lobular cancer.  Mr Breast Bilateral W Wo Contrast  11/25/2010  *RADIOLOGY REPORT*  Clinical Data: Recently diagnosed right breast invasive and in situ mammary carcinoma with calcifications.  The carcinoma has lobular features and appears grade II at pathology.  BUN and creatinine were obtained on site at [facility] at [address] Results:  BUN 11 mg/dL,  Creatinine 0.6 mg/dL.  BILATERAL BREAST MRI WITH AND WITHOUT CONTRAST  Technique: Multiplanar, multisequence MR images of both breasts were obtained prior to and following the intravenous administration of 13ml of MultiHance.  Three dimensional images were evaluated at the independent DynaCad  workstation.  Comparison:  Recent mammogram, ultrasound and biopsy examinations at [facility].  Previous breast MR at [facility] on 04/26/2006.  Findings: Minimal background parenchymal enhancement in both breasts.  The previously demonstrated small, rounded, smoothly marginated enhancing nodule in the anterior aspect of the lower inner quadrant of the left breast is slightly smaller, compatible with a benign process.  In the deep upper inner quadrant of the right breast, a rounded enhancing mass is demonstrated  with mildly irregular margins and central non-enhancement, compatible with necrosis.  The mass has a mixture of enhancement kinetics, including a small amount of rapid washin/washout.  This mass contains a biopsy marker clip artifact and corresponds to the recently biopsied malignancy.  The mass measures 2.2 x 2.0 x 1.8 cm in maximum dimensions.  No additional masses or areas of enhancement suspicious for malignancy in either breast.  No abnormal appearing lymph nodes.  IMPRESSION: 2.2 x 2.0 x 1.8 cm biopsy-proven invasive and in situ lobular carcinoma in the upper inner quadrant of the right breast. Otherwise, unremarkable examination.  THREE-DIMENSIONAL MR IMAGE RENDERING ON INDEPENDENT WORKSTATION:  Three-dimensional MR images were rendered by post-processing of the original MR data on an independent workstation.  The three- dimensional MR images were interpreted, and findings were reported in the accompanying complete MRI report for this study.  BI-RADS CATEGORY 6:  Known biopsy-proven malignancy - appropriate action should be taken.  Recommendation:  Treatment plan.  Original Report Authenticated By: Darrol Angel, M.D.      Assessment and Plan:  Pleasant elderly woman presenting with a fairly large centrally located lobular type cancer of the right breast to previous history of left breast cancer. He continues on Evista. I have discussed the concept of primary daily treatment  with [name] and/or another aromatase inhibitor. I have indicated that this might in fact be able to down stage her cancer to allow for breast conserving surgery. I have outlined side effects of the drug. She appeared to be a somewhat reluctant to go ahead with this and was at one point considering bilateral mastectomies. Ultimately she did agree to go ahead with a trial of [name]. I have also recommended that she have staging CT and bone scan as well as a bone density test I will plan to see her back in approximately 6 weeks and likely do a repeat ultrasound of the right breast in 3 months. I anticipate that she'll be on hormonal therapy for at least 6 months to a year before we know of the potential benefit. I recommended she discontinue Evista. We will monitor vitamin D levels as well.Marland Kitchen

## 2010-12-11 NOTE — Progress Notes (Signed)
Encounter addended by: Oneita Hurt, MD on: 12/11/2010  3:41 PM<BR>     Documentation filed: Document

## 2010-12-11 NOTE — Progress Notes (Signed)
Please see the Nurse Progress Note in the MD Initial Consult Encounter for this patient. 

## 2010-12-11 NOTE — Progress Notes (Signed)
This office note has been dictated.

## 2010-12-11 NOTE — Progress Notes (Signed)
11/14/10 r breast bx: inv and in situ mammary carcinoma w/calcifications

## 2010-12-12 ENCOUNTER — Telehealth: Payer: Self-pay | Admitting: *Deleted

## 2010-12-15 NOTE — Progress Notes (Signed)
Encounter addended by: Ova Freshwater, RN on: 12/15/2010 10:28 AM<BR>     Documentation filed: Charges VN

## 2010-12-17 NOTE — Progress Notes (Signed)
Encounter addended by: Lowella Petties, RN on: 12/17/2010  4:44 PM<BR>     Documentation filed: Charges VN

## 2010-12-18 ENCOUNTER — Encounter: Payer: Self-pay | Admitting: *Deleted

## 2010-12-18 NOTE — Progress Notes (Signed)
Mailed after appt letter to pt. 

## 2010-12-18 NOTE — Progress Notes (Signed)
[name] [phone]                             INITIAL CONSULATATION   CC:   Stacey [name], M.D., F.R.C.P.C.       Stacey [name], M.D.  REQUESTING PHYSICIAN:  Stacey [name], M.D., F.R.C.P.C.  SURGEON:  Stacey [name], M.D.  DIAGNOSIS:  This is a 70 year old woman with stage T2 (2.2 cm) N0 M0, invasive lobular carcinoma of the right breast, clinical stage IIA.  HISTORY OF PRESENT ILLNESS:  Stacey [name] is a very nice 70 year old woman with a history of left breast cancer treated in 1997 by Dr. Kathrin [name] with a breast-conserving approach.  In routine screening, she was found to have a density in the right breast with Dr. Yolanda [name] on October 8th.  She returned on October 11th for spot compression views and ultrasound.  The compression views confirmed a homogeneously dense breast, and ultrasound confirmed a 2-cm hypoechoic mass.  The patient returned on October 12th for ultrasound-guided core needle biopsy which revealed invasive lobular carcinoma with some in situ component.  The tumor was ER positive.  Subsequent breast MRI confirmed that the 2.2 x 2 x 1.8-cm enhancing biopsy-proven mass in the upper-inner quadrant of the right breast is solitary in nature.  The patient was referred by Dr. Ovidio [name] to Dr. Pierce [name] for consideration of adjuvant antiestrogen therapy, and she was kindly referred today for discussion of radiation treatment in a breast-conserving approach.  PAST MEDICAL HISTORY:  COPD, diverticulosis, GERD, hyperlipidemia, irritable bowel syndrome, osteoarthritis, TMJ symptoms.  PAST SURGICAL HISTORY: 1. Tonsillectomy and appendectomy. 2. Rectal fissure repair. 3. Total abdominal hysterectomy in 1979. 4. Left inguinal hernia repair. 5. TMJ arthroplasty. 6. Left lumpectomy with axillary lymph node dissection in 1997 for a     2.5-cm invasive ductal carcinoma treated with chemotherapy,     radiation and tamoxifen  times 5 years. 7. Benign right breast biopsy in 2003. 8. Left foot surgery.  FAMILY HISTORY:  The patient's mother had pancreatic cancer and endometrial cancer.  Her brother had Burkitt lymphoma.  SOCIAL HISTORY:  The patient lives alone.  She retired from [name]. She did smoke one pack per day until October 2012.  She denies heavy alcohol use.  ALLERGIES:  Clindamycin and codeine.  CURRENT MEDICATIONS:  Calcium, aspirin, Celebrex, Evista, Flonase, Claritin, multivitamin, Prilosec, GlycoLax and Ambien.  REVIEW OF SYSTEMS:  A 15-point review of systems is documented in the radiotherapy record.  The patient's dermatologic, neurologic, cardiac, pulmonary, endocrine, gastrointestinal, genitourinary, gynecologic, musculoskeletal, hematologic, psychosocial and dental issues were reviewed.  She does suffer with arthritis and uses Celebrex for this. She has had some left tooth discomfort.  Otherwise, the review is noncontributory.  PHYSICAL EXAMINATION:  General Appearance:  The patient is a well- nourished, well-developed woman in no acute distress.  She is alert and oriented.  Vital Signs:  Her height is 5 feet 3 inches, weight is 149 pounds.  She is afebrile, and other vital signs are within normal limits.  HEENT:  The patient's head is normocephalic and atraumatic. PERRLA.  Oral cavity is clear.  The neck and supraclavicular region is free of adenopathy.  Respiratory effort is unremarkable.  Breasts: Examination of the breasts in seated position reveals some reduction in the overall size of the left breast with a lift.  The right breast shows no architectural distortion in the seated position.  The  patient was then examined in the supine position.  The right breast is notable for a slight bulge in the upper-inner quadrant superior to the nipple. Palpation of the right breast reveals an approximately 2-cm solid round mass 2 cm superior to the nipple and somewhat medial to midline  although the edge of it extends into the upper-outer quadrant of the breast as well.  The overlying skin color is unchanged.  The overlying skin is unattached to the tumor.  The tumor is mobile over the chest wall.  The remainder of the right breast is somewhat atrophic with no dominant masses.  There is an old lumpectomy incision in a radial orientation along the lateral edge from the nipple to the axilla measuring 2 cm in length.  The left breast contains an atrophic glandular body which is somewhat tender to palpation.  There are three previous breast incision scars on the left breast.  Bilateral axillae are free of adenopathy. Extremities:  Shoulder range of motion is unremarkable.  There is no lymphedema noted in either upper extremity.  Abdomen is soft and nontender.  Neurologically, the patient is grossly intact.  RADIOGRAPHIC FINDINGS:  The patient's MRI was reviewed with her today.  IMPRESSION:  Stacey [name] is a very nice 70 year old woman with a clinical stage IIA invasive lobular carcinoma of the right breast which is ER positive.  The tumor measures 2.2 cm, and she may benefit from neoadjuvant downsizing prior to a breast-conserving surgical approach and may be amenable to antiestrogen therapy.  The patient discussed this option with Dr. Pierce [name] yesterday and is likely to begin [name] treatment for this purpose.  PLAN:  Today, I reviewed with Stacey [name] the findings and workup thus far.  We talked about the role of radiation treatment in the management of localized curable breast cancer.  We talked about radiation to the whole breast with the intention of reducing her risk for local recurrence with curative intent.  We discussed the logistics and delivery of treatment as well as the anticipated acute and late sequelae.  Fortunately, Stacey [name] had a favorable experience with breast radiation to the left breast in the past and is interested in pursuing breast conservation  incorporating radiotherapy in the future.  She will continue [name] treatment in a neoadjuvant fashion followed by breast- conserving surgery in 2013.  She will return to the radiation oncology clinic following surgery to review the pathology findings and again review the potential for radiotherapy to the breast.  We spent more than 50% of our 60 minute face-to-face visit today in patient counseling.  I filled out a patient counseling form for her with relevant treatment diagrams and retained a copy for our records.  The patient would like to proceed with the treatment course described above.  I enjoyed talking with her today and encouraged her to call our office if she has any questions in the interim.  Otherwise, she will return to proceed with radiation treatment following surgery.    ______________________________ Oneita Hurt, M.D. MAM/MEDQ  D:  12/11/2010  T:  12/18/2010  Job:  [id number]

## 2010-12-29 ENCOUNTER — Telehealth: Payer: Self-pay | Admitting: *Deleted

## 2010-12-29 NOTE — Telephone Encounter (Signed)
informed patient of the new date and time on 01-19-2011 at 2:30pm.

## 2011-01-01 NOTE — Progress Notes (Signed)
Encounter addended by: Delynn Flavin, RN on: 01/01/2011  9:59 AM<BR>     Documentation filed: Charges VN

## 2011-01-16 ENCOUNTER — Ambulatory Visit: Payer: Medicare Other | Admitting: Oncology

## 2011-01-19 ENCOUNTER — Ambulatory Visit (HOSPITAL_BASED_OUTPATIENT_CLINIC_OR_DEPARTMENT_OTHER): Payer: Medicare Other | Admitting: Oncology

## 2011-01-19 VITALS — BP 137/82 | HR 87 | Temp 98.7°F | Ht 63.0 in | Wt 149.1 lb

## 2011-01-19 DIAGNOSIS — C50919 Malignant neoplasm of unspecified site of unspecified female breast: Secondary | ICD-10-CM

## 2011-01-19 DIAGNOSIS — Z79811 Long term (current) use of aromatase inhibitors: Secondary | ICD-10-CM

## 2011-01-19 DIAGNOSIS — E559 Vitamin D deficiency, unspecified: Secondary | ICD-10-CM

## 2011-01-19 NOTE — Progress Notes (Signed)
Hematology and Oncology Follow Up Visit  Stacey [name] [id number] 1940/07/11 70 y.o. 01/19/2011 3:39 PM PCP  Principle Diagnosis: 70 yo woman with hx of locally advanced lobular cancer currently on femara starting in 11/12.  Interim History:  There have been no intercurrent illness, hospitalizations or medication changes.  Medications: I have reviewed the patient's current medications.  Allergies:  Allergies  Allergen Reactions  . Clindamycin/Lincomycin Diarrhea and Nausea And Vomiting  . Codeine     REACTION: nausea    Past Medical History, Surgical history, Social history, and Family History were reviewed and updated.  Review of Systems: Constitutional:  Negative for fever, chills, night sweats, anorexia, weight loss, pain. Cardiovascular: no chest pain or dyspnea on exertion Respiratory: no cough, shortness of breath, or wheezing Neurological: no TIA or stroke symptoms Dermatological: negative ENT: negative Skin Gastrointestinal: no abdominal pain, change in bowel habits, or black or bloody stools Genito-Urinary: no dysuria, trouble voiding, or hematuria Hematological and Lymphatic: negative Breast: negative for breast lumps, feels that breast mass is larger. Musculoskeletal: negative, c/o hand pain. Remaining ROS negative.  Physical Exam: Blood pressure 137/82, pulse 87, temperature 98.7 F (37.1 C), height 5\' 3"  (1.6 m), weight 149 lb 1.6 oz (67.631 kg). ECOG: 0 General appearance: alert, cooperative and appears stated age Head: Normocephalic, without obvious abnormality, atraumatic Neck: no adenopathy, no carotid bruit, no JVD, supple, symmetrical, trachea midline and thyroid not enlarged, symmetric, no tenderness/mass/nodules Lymph nodes: Cervical, supraclavicular, and axillary nodes normal. Cardiac : regular rate and rhythm, no murmurs or gallops Pulmonary:clear to auscultation bilaterally and normal percussion bilaterally Breasts: positive findings: rt breast  mass ~ 3cm, sl smaller and softer Abdomen:soft, non-tender; bowel sounds normal; no masses,  no organomegaly Extremities negative Neuro: alert, oriented, normal speech, no focal findings or movement disorder noted  Lab Results: Lab Results  Component Value Date   WBC 7.8 12/10/2010   HGB 12.6 12/10/2010   HCT 37.1 12/10/2010   MCV 87.9 12/10/2010   PLT 218 12/10/2010     Chemistry      Component Value Date/Time   NA 139 12/10/2010 0941   K 3.9 12/10/2010 0941   CL 103 12/10/2010 0941   CO2 28 12/10/2010 0941   BUN 15 12/10/2010 0941   CREATININE 0.66 12/10/2010 0941      Component Value Date/Time   CALCIUM 9.4 12/10/2010 0941   ALKPHOS 66 12/10/2010 0941   AST 22 12/10/2010 0941   ALT 18 12/10/2010 0941   BILITOT 0.2* 12/10/2010 0941      .pathology. Radiological Studies: chest X-ray n/a Mammogram n/a Bone density n/a  Impression and Plan: Ms Holcomb is doing well and tolerating femara well, I think her mass is slightly smaller . I plan on seeing her in 6 weeks with f/u u/s and staging CT and bone density.  More than 50% of the visit was spent in patient-related counselling   [name], MD 12/17/20123:39 PM

## 2011-01-20 ENCOUNTER — Telehealth: Payer: Self-pay | Admitting: *Deleted

## 2011-01-20 NOTE — Telephone Encounter (Signed)
gave patient appointment in two months per dr.'s orders printed out calendar and gave to the patient

## 2011-02-15 ENCOUNTER — Other Ambulatory Visit: Payer: Self-pay | Admitting: Internal Medicine

## 2011-02-16 NOTE — Telephone Encounter (Signed)
Done hardcopy to robin  

## 2011-02-16 NOTE — Telephone Encounter (Signed)
Faxed hardcopy to [employer] [phone]

## 2011-02-26 NOTE — Progress Notes (Signed)
Order for bone density rec'd from [facility], signed by PA and faxed back to [phone].

## 2011-03-05 ENCOUNTER — Other Ambulatory Visit: Payer: Self-pay | Admitting: *Deleted

## 2011-03-05 DIAGNOSIS — M949 Disorder of cartilage, unspecified: Secondary | ICD-10-CM

## 2011-03-05 DIAGNOSIS — C50919 Malignant neoplasm of unspecified site of unspecified female breast: Secondary | ICD-10-CM

## 2011-03-05 DIAGNOSIS — E785 Hyperlipidemia, unspecified: Secondary | ICD-10-CM

## 2011-03-05 DIAGNOSIS — Z853 Personal history of malignant neoplasm of breast: Secondary | ICD-10-CM | POA: Diagnosis not present

## 2011-03-06 ENCOUNTER — Encounter (HOSPITAL_COMMUNITY)
Admission: RE | Admit: 2011-03-06 | Discharge: 2011-03-06 | Disposition: A | Discharge status: Home / Self Care | Payer: Medicare Other | Source: Ambulatory Visit | Attending: Oncology | Admitting: Oncology

## 2011-03-06 ENCOUNTER — Encounter (HOSPITAL_COMMUNITY): Payer: Self-pay

## 2011-03-06 ENCOUNTER — Other Ambulatory Visit (HOSPITAL_BASED_OUTPATIENT_CLINIC_OR_DEPARTMENT_OTHER): Payer: Medicare Other | Admitting: Lab

## 2011-03-06 ENCOUNTER — Encounter: Payer: Self-pay | Admitting: Internal Medicine

## 2011-03-06 DIAGNOSIS — Z09 Encounter for follow-up examination after completed treatment for conditions other than malignant neoplasm: Secondary | ICD-10-CM | POA: Diagnosis not present

## 2011-03-06 DIAGNOSIS — C50919 Malignant neoplasm of unspecified site of unspecified female breast: Secondary | ICD-10-CM | POA: Diagnosis not present

## 2011-03-06 DIAGNOSIS — M899 Disorder of bone, unspecified: Secondary | ICD-10-CM

## 2011-03-06 DIAGNOSIS — E785 Hyperlipidemia, unspecified: Secondary | ICD-10-CM

## 2011-03-06 DIAGNOSIS — E278 Other specified disorders of adrenal gland: Secondary | ICD-10-CM | POA: Diagnosis not present

## 2011-03-06 LAB — CMP (CANCER CENTER ONLY)
ALT(SGPT): 26 U/L (ref 10–47)
Alkaline Phosphatase: 47 U/L (ref 26–84)
Creat: 0.8 mg/dl (ref 0.6–1.2)
Sodium: 144 mEq/L (ref 128–145)
Total Bilirubin: 0.4 mg/dl (ref 0.20–1.60)
Total Protein: 7 g/dL (ref 6.4–8.1)

## 2011-03-06 LAB — CBC WITH DIFFERENTIAL/PLATELET
Basophils Absolute: 0 10*3/uL (ref 0.0–0.1)
Eosinophils Absolute: 0.3 10*3/uL (ref 0.0–0.5)
HCT: 37.6 % (ref 34.8–46.6)
HGB: 12.8 g/dL (ref 11.6–15.9)
MCH: 30.3 pg (ref 25.1–34.0)
NEUT#: 3.2 10*3/uL (ref 1.5–6.5)
NEUT%: 55.5 % (ref 38.4–76.8)
RDW: 13.4 % (ref 11.2–14.5)
lymph#: 1.8 10*3/uL (ref 0.9–3.3)

## 2011-03-06 MED ORDER — TECHNETIUM TC 99M MEDRONATE IV KIT
24.5000 | PACK | Freq: Once | INTRAVENOUS | Status: AC | PRN
  Administered 2011-03-06: 24.5 via INTRAVENOUS

## 2011-03-06 MED ORDER — IOHEXOL 300 MG/ML  SOLN
80.0000 mL | Freq: Once | INTRAMUSCULAR | Status: AC | PRN
  Administered 2011-03-06: 80 mL via INTRAVENOUS

## 2011-03-10 ENCOUNTER — Ambulatory Visit (HOSPITAL_BASED_OUTPATIENT_CLINIC_OR_DEPARTMENT_OTHER): Payer: Medicare Other | Admitting: Oncology

## 2011-03-10 ENCOUNTER — Other Ambulatory Visit: Payer: Self-pay | Admitting: *Deleted

## 2011-03-10 ENCOUNTER — Other Ambulatory Visit: Payer: Medicare Other | Admitting: Lab

## 2011-03-10 VITALS — BP 136/79 | HR 80 | Temp 98.5°F | Ht 63.0 in | Wt 148.9 lb

## 2011-03-10 DIAGNOSIS — C50919 Malignant neoplasm of unspecified site of unspecified female breast: Secondary | ICD-10-CM | POA: Diagnosis not present

## 2011-03-10 NOTE — Progress Notes (Signed)
Hematology and Oncology Follow Up Visit  Stacey [name] [id number] 08-20-1940 71 y.o. 03/10/2011 3:06 PM PCP  Principle Diagnosis: 71 yo woman with hx of locally advanced lobular cancer currently on femara starting in 11/12.  Interim History:  There have been no intercurrent illness, hospitalizations or medication changes.  Medications: I have reviewed the patient's current medications.  Allergies:  Allergies  Allergen Reactions  . Clindamycin/Lincomycin Diarrhea and Nausea And Vomiting  . Codeine     REACTION: nausea    Past Medical History, Surgical history, Social history, and Family History were reviewed and updated.  Review of Systems: Constitutional:  Negative for fever, chills, night sweats, anorexia, weight loss, pain. Cardiovascular: no chest pain or dyspnea on exertion Respiratory: no cough, shortness of breath, or wheezing Neurological: no TIA or stroke symptoms Dermatological: negative ENT: negative Skin Gastrointestinal: no abdominal pain, change in bowel habits, or black or bloody stools Genito-Urinary: no dysuria, trouble voiding, or hematuria Hematological and Lymphatic: negative Breast: negative for breast lumps, feels that breast mass is larger. Musculoskeletal: negative, c/o hand pain. Remaining ROS negative.  Physical Exam: Blood pressure 136/79, pulse 80, temperature 98.5 F (36.9 C), height 5\' 3"  (1.6 m), weight 148 lb 14.4 oz (67.541 kg). ECOG: 0 General appearance: alert, cooperative and appears stated age Head: Normocephalic, without obvious abnormality, atraumatic Neck: no adenopathy, no carotid bruit, no JVD, supple, symmetrical, trachea midline and thyroid not enlarged, symmetric, no tenderness/mass/nodules Lymph nodes: Cervical, supraclavicular, and axillary nodes normal. Cardiac : regular rate and rhythm, no murmurs or gallops Pulmonary:clear to auscultation bilaterally and normal percussion bilaterally Breasts: positive findings: rt breast  mass ~ 3cm, sl smaller and softer Abdomen:soft, non-tender; bowel sounds normal; no masses,  no organomegaly Extremities negative Neuro: alert, oriented, normal speech, no focal findings or movement disorder noted  Lab Results: Lab Results  Component Value Date   WBC 5.9 03/06/2011   HGB 12.8 03/06/2011   HCT 37.6 03/06/2011   MCV 89.2 03/06/2011   PLT 300 03/06/2011     Chemistry      Component Value Date/Time   NA 144 03/06/2011 0840   NA 139 12/10/2010 0941   K 4.0 03/06/2011 0840   K 3.9 12/10/2010 0941   CL 106 03/06/2011 0840   CL 103 12/10/2010 0941   CO2 28 03/06/2011 0840   CO2 28 12/10/2010 0941   BUN 17 03/06/2011 0840   BUN 15 12/10/2010 0941   CREATININE 0.8 03/06/2011 0840   CREATININE 0.66 12/10/2010 0941      Component Value Date/Time   CALCIUM 8.5 03/06/2011 0840   CALCIUM 9.4 12/10/2010 0941   ALKPHOS 47 03/06/2011 0840   ALKPHOS 66 12/10/2010 0941   AST 30 03/06/2011 0840   AST 22 12/10/2010 0941   ALT 18 12/10/2010 0941   BILITOT 0.40 03/06/2011 0840   BILITOT 0.2* 12/10/2010 0941      .pathology. Radiological Studies: chest X-ray n/a Mammogram n/a Bone density n/a  Impression and Plan: Stacey [name] is doing well and tolerating femara well, I think her mass is slightly smaller . I plan on seeing her in 6 weeks with f/u u/s and staging CT and bone density.  More than 50% of the visit was spent in patient-related counselling   [name], MD 2/5/20133:06 PM

## 2011-03-11 ENCOUNTER — Encounter (INDEPENDENT_AMBULATORY_CARE_PROVIDER_SITE_OTHER): Payer: Self-pay | Admitting: Surgery

## 2011-03-12 ENCOUNTER — Ambulatory Visit (INDEPENDENT_AMBULATORY_CARE_PROVIDER_SITE_OTHER): Payer: Medicare Other | Admitting: Surgery

## 2011-03-12 ENCOUNTER — Encounter (INDEPENDENT_AMBULATORY_CARE_PROVIDER_SITE_OTHER): Payer: Self-pay | Admitting: Surgery

## 2011-03-12 DIAGNOSIS — C50911 Malignant neoplasm of unspecified site of right female breast: Secondary | ICD-10-CM

## 2011-03-12 DIAGNOSIS — C50919 Malignant neoplasm of unspecified site of unspecified female breast: Secondary | ICD-10-CM

## 2011-03-12 NOTE — Progress Notes (Signed)
ASSESSMENT AND PLAN: 1.  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  (Originally)   ER/PR pos, Ki67 - 20%, Her2Neu - neg.  She sees Drs. Rubin/Manning and is taking [name].  The plan is for neoadjuvant for 6 to 12 months.   Dr. Donnie Coffin was concerned about the size has not improved as much as the US done as [facility] described.  My US shows the maximum tumor at about 1.5 cm.  The cancer sits in an area of dense breast tissue, which makes physical exam difficult.  I agree with scheduled MRI.  I think the tumor is smaller, but the MRI can be tie breaker as to further management.  I want to see her a little sooner, so she will see me in 2 months.  2.  Left breast cancer - 1997.  Treated with lumpectomy, Axillary node dissection by Dr. Ivor Reining.  I discussed with the patient if she wanted Streck handling this cancer, but she is going to stay with me.  She last saw Streck in 01/22/2003.  3.  Anxiety. 4.  Hyperlipidemia. 5.  Had quit smoking.  But has restarted. 6.  COPD. 7.  Arthritis, on Celebrex.  The arthritis of her hands has acted up. 8.  Left posterior tooth extracted.  She has an implant in now.  She sees Dr. Cherlyn Cushing.  REFERRING PHYSICIAN: Oliver Barre, MD, MD  HISTORY OF PRESENT ILLNESS: Stacey [name] is a 71 y.o. (DOB: 11-06-1940)  white female whose primary care physician is Oliver Barre, MD, MD and comes to me today for folllow up of right breast cancer being treated with neoadjuvant [name].  The patient had a left breast cancer Dr. Cicero Duck in 1997. She underwent chemotherapy supervised by Dr. Mancel Bale and has done well since that time.  Presented to Breast Ca Conf on 10/24: 2 cm mass in right breast on [place], but on mammogram poorly seen because of density of breast tissue.  MRI shows 2.2 cm right breast mass Lobular features.    ER/PR pos, Ki67 - 20%, Her2Neu - neg  Recent US, 03/05/2011, shows decrease in size from 2.0 x 1.8 cm to 1.2 x 1.1 cm.  The cancer is in an area  of dense breast tissue.  My US shows the mass 1.5 cm x 1.4 cm.  She is doing well except for some tenderness in the breast.   Past Medical History  Diagnosis Date  . ANEMIA-NOS 12/17/2006  . ANXIETY 05/03/2009  . CONSTIPATION 03/28/2010  . COPD 06/14/2008  . Cough 12/17/2006  . DIVERTICULOSIS, COLON 12/17/2006  . DYSPNEA 06/14/2008  . FATIGUE 06/14/2007  . GERD 03/28/2010  . HYPERLIPIDEMIA 06/14/2007  . IRRITABLE BOWEL SYNDROME, HX OF 12/17/2006  . 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 Dyspnea   . Cancer     bilateral  . Breast cancer 1997    s/p chemo/xrt     Current Outpatient Prescriptions  Medication Sig Dispense Refill  . aspirin 81 MG EC tablet Take 81 mg by mouth daily.        . Calcium Carbonate-Vit D-Min (CALCIUM 600+D PLUS MINERALS) 600-400 MG-UNIT CHEW Chew by mouth daily.        . CELEBREX 200 MG capsule take 1 capsule by mouth twice a day if needed  60 capsule  8  . cholecalciferol (VITAMIN D) 1000 UNITS tablet Take 1,000 Units by mouth daily.        Marland Kitchen EPINEPHrine (EPIPEN) 0.3 mg/0.3 mL  DEVI Use as directed       . fluticasone (FLONASE) 50 MCG/ACT nasal spray instill 2 sprays into each nostril once daily  16 g  3  . letrozole ([name]) 2.5 MG tablet Take 2.5 mg by mouth daily.        . Loratadine (CLARITIN PO) Take by mouth.        . Multiple Vitamin (MULTIVITAMIN) capsule Take 1 capsule by mouth daily.        Marland Kitchen omeprazole (PRILOSEC) 20 MG capsule Take 1 capsule (20 mg total) by mouth daily.  90 capsule  3  . polyethylene glycol powder (GLYCOLAX/MIRALAX) powder use as directed  527 g  8  . zolpidem (AMBIEN) 10 MG tablet take 1 tablet by mouth at bedtime if needed  30 tablet  5    Allergies  Allergen Reactions  . Clindamycin/Lincomycin Diarrhea and Nausea And Vomiting  . Codeine     REACTION: nausea    REVIEW OF SYSTEMS: Skin:  No history of rash.  No history of abnormal moles. Infection:  No history of hepatitis or HIV.  No history of MRSA. Neurologic:  No history  of stroke.  No history of seizure.  No history of headaches. Cardiac:  No history of hypertension. No history of heart disease.  No history of prior cardiac catheterization.  No history of seeing a cardiologist. Pulmonary:  Smoke cigarettes. Quit 1 week ago.  COPD.  But has started smoking again.  Endocrine:  No diabetes. No thyroid disease. Gastrointestinal:  No history of stomach disease.  No history of liver disease.  No history of gall bladder disease.  No history of pancreas disease.  No history of colon disease. Sees Dr. Kizzie Furnish.  Last colonoscopy 2004, for next one in 2014. Urologic:  No history of kidney stones.  No history of bladder infections. GYN:  Sees Dr. Renato Shin. Musculoskeletal:  Arthritis.  Takes Celebrex for this. Hematologic:  No bleeding disorder.  No history of anemia.  Not anticoagulated. Psycho-social:  The patient is oriented.   The patient has no obvious psychologic or social impairment to understanding our conversation and plan. Dental:  Left posterior tooth trouble.  SOCIAL and FAMILY HISTORY: Mother of Blondell Reveal (?). [name] with her. I took care of her mother, Darnelle Spangle.  PHYSICAL EXAM: BP 140/72  Pulse 76  Temp(Src) 97 F (36.1 C) (Temporal)  Resp 16  Ht 5' 3.75" (1.619 m)  Wt 169 lb 9.6 oz (76.93 kg)  BMI 29.34 kg/m2  General: WN WF  who is alert and generally healthy appearing.  HEENT: Normal. Pupils equal. Good dentition. Neck: Supple. No mass.  No thyroid mass.  Carotid pulse okay with no bruit.  Breasts:    Mass, about 2.5 cm, at 12 o'clock.  But US shows the "tumor" mass at 1.5 cm in maximum diameter.  Extremities:  Good strength and ROM  in upper and lower extremities. Neurologic:  Grossly intact to motor and sensory function. Psychiatric: Has normal mood and affect. Behavior is normal.   DATA REVIEWED: [place] at [facility] 03/05/2011. I did put my [place] in the chart.  Ovidio Kin, M.D., [facility],  [place] [phone]

## 2011-03-27 NOTE — Telephone Encounter (Signed)
xxx

## 2011-04-15 ENCOUNTER — Ambulatory Visit (HOSPITAL_COMMUNITY)
Admission: RE | Admit: 2011-04-15 | Discharge: 2011-04-15 | Disposition: A | Discharge status: Home / Self Care | Payer: Medicare Other | Source: Ambulatory Visit | Attending: Oncology | Admitting: Oncology

## 2011-04-15 DIAGNOSIS — C50919 Malignant neoplasm of unspecified site of unspecified female breast: Secondary | ICD-10-CM | POA: Insufficient documentation

## 2011-04-15 DIAGNOSIS — Z853 Personal history of malignant neoplasm of breast: Secondary | ICD-10-CM | POA: Diagnosis not present

## 2011-04-15 DIAGNOSIS — N63 Unspecified lump in unspecified breast: Secondary | ICD-10-CM | POA: Diagnosis not present

## 2011-04-15 MED ORDER — GADOBENATE DIMEGLUMINE 529 MG/ML IV SOLN
12.0000 mL | Freq: Once | INTRAVENOUS | Status: AC | PRN
  Administered 2011-04-15: 12 mL via INTRAVENOUS

## 2011-04-16 ENCOUNTER — Inpatient Hospital Stay (HOSPITAL_COMMUNITY): Admission: RE | Admit: 2011-04-16 | Payer: Medicare Other | Source: Ambulatory Visit

## 2011-04-22 ENCOUNTER — Telehealth: Payer: Self-pay | Admitting: *Deleted

## 2011-04-22 ENCOUNTER — Other Ambulatory Visit: Payer: Medicare Other | Admitting: Lab

## 2011-04-22 ENCOUNTER — Ambulatory Visit (HOSPITAL_BASED_OUTPATIENT_CLINIC_OR_DEPARTMENT_OTHER): Payer: Medicare Other | Admitting: Oncology

## 2011-04-22 VITALS — BP 128/73 | HR 80 | Temp 98.3°F | Ht 63.75 in | Wt 145.3 lb

## 2011-04-22 DIAGNOSIS — C50919 Malignant neoplasm of unspecified site of unspecified female breast: Secondary | ICD-10-CM | POA: Diagnosis not present

## 2011-04-22 DIAGNOSIS — N63 Unspecified lump in unspecified breast: Secondary | ICD-10-CM

## 2011-04-22 DIAGNOSIS — M899 Disorder of bone, unspecified: Secondary | ICD-10-CM | POA: Diagnosis not present

## 2011-04-22 DIAGNOSIS — E559 Vitamin D deficiency, unspecified: Secondary | ICD-10-CM

## 2011-04-22 DIAGNOSIS — M949 Disorder of cartilage, unspecified: Secondary | ICD-10-CM | POA: Diagnosis not present

## 2011-04-22 DIAGNOSIS — Z79811 Long term (current) use of aromatase inhibitors: Secondary | ICD-10-CM

## 2011-04-22 NOTE — Telephone Encounter (Signed)
gave patient appointment for 06-26-2011 at 2:00pm per patient she does not do labs

## 2011-04-22 NOTE — Progress Notes (Signed)
Hematology and Oncology Follow Up Visit  OMNIA DOLLINGER [id number] 16-Dec-1940 71 y.o. 04/22/2011 3:31 PM PCP  Principle Diagnosis: 71 yo woman with hx of locally advanced lobular cancer currently on femara starting in 11/12.  Interim History:  There have been no intercurrent illness, hospitalizations or medication changes. She feels well.  Medications: I have reviewed the patient's current medications.  Allergies:  Allergies  Allergen Reactions  . Clindamycin/Lincomycin Diarrhea and Nausea And Vomiting  . Codeine     REACTION: nausea    Past Medical History, Surgical history, Social history, and Family History were reviewed and updated.  Review of Systems: Constitutional:  Negative for fever, chills, night sweats, anorexia, weight loss, pain. Cardiovascular: no chest pain or dyspnea on exertion Respiratory: no cough, shortness of breath, or wheezing Neurological: no TIA or stroke symptoms Dermatological: negative ENT: negative Skin Gastrointestinal: no abdominal pain, change in bowel habits, or black or bloody stools Genito-Urinary: no dysuria, trouble voiding, or hematuria Hematological and Lymphatic: negative Breast: negative for breast lumps, feels that breast mass is larger. Musculoskeletal: negative, c/o hand pain. Remaining ROS negative.  Physical Exam: Blood pressure 128/73, pulse 80, temperature 98.3 F (36.8 C), temperature source Oral, height 5' 3.75" (1.619 m), weight 145 lb 4.8 oz (65.908 kg). ECOG: 0 General appearance: alert, cooperative and appears stated age Head: Normocephalic, without obvious abnormality, atraumatic Neck: no adenopathy, no carotid bruit, no JVD, supple, symmetrical, trachea midline and thyroid not enlarged, symmetric, no tenderness/mass/nodules Lymph nodes: Cervical, supraclavicular, and axillary nodes normal. Cardiac : regular rate and rhythm, no murmurs or gallops Pulmonary:clear to auscultation bilaterally and normal percussion  bilaterally Breasts: rt breast mass ~ 2.5 cm  Central portion . Abdomen:soft, non-tender; bowel sounds normal; no masses,  no organomegaly Extremities negative Neuro: alert, oriented, normal speech, no focal findings or movement disorder noted  Lab Results: Lab Results  Component Value Date   WBC 5.9 03/06/2011   HGB 12.8 03/06/2011   HCT 37.6 03/06/2011   MCV 89.2 03/06/2011   PLT 300 03/06/2011     Chemistry      Component Value Date/Time   NA 144 03/06/2011 0840   NA 139 12/10/2010 0941   K 4.0 03/06/2011 0840   K 3.9 12/10/2010 0941   CL 106 03/06/2011 0840   CL 103 12/10/2010 0941   CO2 28 03/06/2011 0840   CO2 28 12/10/2010 0941   BUN 17 03/06/2011 0840   BUN 15 12/10/2010 0941   CREATININE 0.8 03/06/2011 0840   CREATININE 0.66 12/10/2010 0941      Component Value Date/Time   CALCIUM 8.5 03/06/2011 0840   CALCIUM 9.4 12/10/2010 0941   ALKPHOS 47 03/06/2011 0840   ALKPHOS 66 12/10/2010 0941   AST 30 03/06/2011 0840   AST 22 12/10/2010 0941   ALT 18 12/10/2010 0941   BILITOT 0.40 03/06/2011 0840   BILITOT 0.2* 12/10/2010 0941      .pathology. Radiological Studies: chest X-ray n/a Mammogram n/a Bone density n/a  Impression and Plan: Ms Meanor is doing well and tolerating femara well, I think her mass is slightly smaller . I plan on seeing her in 6 weeks.  Recent MRI confirms size of mass now down to 1.1x1.6x1.7. Of note is that her most recent bone density test shows stable osteopenia with a T score of -1.6  More than 50% of the visit was spent in patient-related counselling   [name], MD 3/20/20133:31 PM

## 2011-05-12 ENCOUNTER — Ambulatory Visit (INDEPENDENT_AMBULATORY_CARE_PROVIDER_SITE_OTHER): Payer: Medicare Other | Admitting: Surgery

## 2011-05-12 ENCOUNTER — Encounter (INDEPENDENT_AMBULATORY_CARE_PROVIDER_SITE_OTHER): Payer: Self-pay | Admitting: Surgery

## 2011-05-12 VITALS — BP 108/68 | HR 104 | Temp 99.2°F | Resp 18 | Ht 63.75 in | Wt 144.0 lb

## 2011-05-12 DIAGNOSIS — C50919 Malignant neoplasm of unspecified site of unspecified female breast: Secondary | ICD-10-CM | POA: Diagnosis not present

## 2011-05-12 DIAGNOSIS — C50911 Malignant neoplasm of unspecified site of right female breast: Secondary | ICD-10-CM

## 2011-05-12 NOTE — Progress Notes (Signed)
ASSESSMENT AND PLAN: 1.  New right breast cancer.  UIQ.  First seen 11/28/2010.  2.2 cm.  Lobular.  (Originally)   ER/PR pos, Ki67 - 20%, Her2Neu - neg.  She sees Drs. Rubin/Manning and is taking [name].  The plan is for neoadjuvant for 6 to 12 months. (Note: now at 6 months)  MRI 04/16/2011 - showed 1.7 x 1.6 x 1.1 cm lesion (about half original size)  She has no new complaints.  The tumor seems to be shrinking.  I will see her every 2 months and make a decision as to lumpectomy when we think the tumor has stopped shrinking.  2.  Left breast cancer - 1997.  Treated with lumpectomy, Axillary node dissection by Dr. Ivor Reining.  I discussed with the patient if she wanted Streck handling this cancer, but she is going to stay with me.  She last saw Streck in 01/22/2003.  3.  Anxiety. 4.  Hyperlipidemia. 5.  Had quit smoking.  But has restarted. 6.  COPD. 7.  Arthritis, on Celebrex.  The arthritis of her hands has acted up. 8.  Left posterior tooth extracted.  She has an implant in now.  She sees Dr. Cherlyn Cushing. 9.  Has cold/cough that has gone on over a week.  But she is getting better.  REFERRING PHYSICIAN: Oliver Barre, MD, MD  HISTORY OF PRESENT ILLNESS: Stacey [name] is a 71 y.o. (DOB: 05/09/1940)  white female whose primary care physician is Oliver Barre, MD, MD and comes to me today for folllow up of right breast cancer being treated with neoadjuvant [name].  The patient had a left breast cancer Dr. Cicero Duck in 1997. She underwent chemotherapy supervised by Dr. Mancel Bale and has done well since that time.  Presented to Breast Ca Conf on 10/24: 2 cm mass in right breast on [place], but on mammogram poorly seen because of density of breast tissue.  MRI shows 2.2 cm right breast mass Lobular features.    ER/PR pos, Ki67 - 20%, Her2Neu - neg  The MRI  04/16/2011 - showed 1.7 x 1.6 x 1.1 cm lesion (about half original size).  This confirmed my [place].  Will follow her closely as to timing of the  surgery.  Her biggest problem today is getting over a cough/cold.   Past Medical History  Diagnosis Date  . ANEMIA-NOS 12/17/2006  . ANXIETY 05/03/2009  . CONSTIPATION 03/28/2010  . COPD 06/14/2008  . Cough 12/17/2006  . DIVERTICULOSIS, COLON 12/17/2006  . DYSPNEA 06/14/2008  . FATIGUE 06/14/2007  . GERD 03/28/2010  . HYPERLIPIDEMIA 06/14/2007  . IRRITABLE BOWEL SYNDROME, HX OF 12/17/2006  . 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 Dyspnea   . Cancer     bilateral  . Breast cancer 1997    s/p chemo/xrt     Current Outpatient Prescriptions  Medication Sig Dispense Refill  . aspirin 81 MG EC tablet Take 81 mg by mouth daily.        . Calcium Carbonate-Vit D-Min (CALCIUM 600+D PLUS MINERALS) 600-400 MG-UNIT CHEW Chew by mouth daily.        . CELEBREX 200 MG capsule take 1 capsule by mouth twice a day if needed  60 capsule  8  . cholecalciferol (VITAMIN D) 1000 UNITS tablet Take 1,000 Units by mouth daily.        Marland Kitchen EPINEPHrine (EPIPEN) 0.3 mg/0.3 mL DEVI Use as directed       . fluticasone (FLONASE) 50 MCG/ACT nasal spray instill 2  sprays into each nostril once daily  16 g  3  . letrozole ([name]) 2.5 MG tablet Take 2.5 mg by mouth daily.        . Loratadine (CLARITIN PO) Take by mouth.        . Multiple Vitamin (MULTIVITAMIN) capsule Take 1 capsule by mouth daily.        Marland Kitchen omeprazole (PRILOSEC) 20 MG capsule Take 1 capsule (20 mg total) by mouth daily.  90 capsule  3  . polyethylene glycol powder (GLYCOLAX/MIRALAX) powder use as directed  527 g  8  . zolpidem (AMBIEN) 10 MG tablet take 1 tablet by mouth at bedtime if needed  30 tablet  5    Allergies  Allergen Reactions  . Clindamycin/Lincomycin Diarrhea and Nausea And Vomiting  . Codeine     REACTION: nausea    REVIEW OF SYSTEMS: Skin:  No history of rash.  No history of abnormal moles. Infection:  No history of hepatitis or HIV.  No history of MRSA. Neurologic:  No history of stroke.  No history of seizure.  No history of  headaches. Cardiac:  No history of hypertension. No history of heart disease.  No history of prior cardiac catheterization.  No history of seeing a cardiologist. Pulmonary:  Smoke cigarettes. Quit 1 week ago.  COPD.  But has started smoking again.  Endocrine:  No diabetes. No thyroid disease. Gastrointestinal:  No history of stomach disease.  No history of liver disease.  No history of gall bladder disease.  No history of pancreas disease.  No history of colon disease. Sees Dr. Kizzie Furnish.  Last colonoscopy 2004, for next one in 2014. Urologic:  No history of kidney stones.  No history of bladder infections. GYN:  Sees Dr. Renato Shin. Musculoskeletal:  Arthritis.  Takes Celebrex for this. Hematologic:  No bleeding disorder.  No history of anemia.  Not anticoagulated. Psycho-social:  The patient is oriented.   The patient has no obvious psychologic or social impairment to understanding our conversation and plan. Dental:  Left posterior tooth trouble.  SOCIAL and FAMILY HISTORY: Mother of Blondell Reveal (?). [name] with her. I took care of her mother, Darnelle Spangle.  PHYSICAL EXAM: BP 108/68  Pulse 104  Temp(Src) 99.2 F (37.3 C) (Temporal)  Resp 18  Ht 5' 3.75" (1.619 m)  Wt 144 lb (65.318 kg)  BMI 24.91 kg/m2  General: WN WF  who is alert and generally healthy appearing.  HEENT: Normal. Pupils equal. Good dentition. Neck: Supple. No mass.  No thyroid mass.  Carotid pulse okay with no bruit. Lungs:  Clear, despite cough. Breasts:    Mass, about 2.0 cm, at 12 o'clock.  I feel no adenopathy.  Extremities:  Good strength and ROM  in upper and lower extremities. Neurologic:  Grossly intact to motor and sensory function. Psychiatric: Has normal mood and affect. Behavior is normal.   DATA REVIEWED: MRI from two weeks ago and I repeated an office [place].  Ovidio Kin, M.D., [facility], [place] [phone]

## 2011-06-01 ENCOUNTER — Other Ambulatory Visit: Payer: Self-pay | Admitting: *Deleted

## 2011-06-01 MED ORDER — LETROZOLE 2.5 MG PO TABS: 2.5000 mg | ORAL_TABLET | Freq: Every day | ORAL | Status: DC

## 2011-06-12 ENCOUNTER — Other Ambulatory Visit: Payer: Self-pay | Admitting: Internal Medicine

## 2011-06-19 DIAGNOSIS — H2510 Age-related nuclear cataract, unspecified eye: Secondary | ICD-10-CM | POA: Diagnosis not present

## 2011-06-19 DIAGNOSIS — H01009 Unspecified blepharitis unspecified eye, unspecified eyelid: Secondary | ICD-10-CM | POA: Diagnosis not present

## 2011-06-19 DIAGNOSIS — H43819 Vitreous degeneration, unspecified eye: Secondary | ICD-10-CM | POA: Diagnosis not present

## 2011-06-19 DIAGNOSIS — H40019 Open angle with borderline findings, low risk, unspecified eye: Secondary | ICD-10-CM | POA: Diagnosis not present

## 2011-06-19 DIAGNOSIS — H35369 Drusen (degenerative) of macula, unspecified eye: Secondary | ICD-10-CM | POA: Diagnosis not present

## 2011-06-26 ENCOUNTER — Ambulatory Visit (HOSPITAL_BASED_OUTPATIENT_CLINIC_OR_DEPARTMENT_OTHER): Payer: Medicare Other | Admitting: Oncology

## 2011-06-26 ENCOUNTER — Telehealth: Payer: Self-pay | Admitting: *Deleted

## 2011-06-26 VITALS — BP 128/76 | HR 92 | Temp 98.2°F | Ht 63.75 in | Wt 141.9 lb

## 2011-06-26 DIAGNOSIS — C50919 Malignant neoplasm of unspecified site of unspecified female breast: Secondary | ICD-10-CM

## 2011-06-26 NOTE — Telephone Encounter (Signed)
gave patient appointment for 08-26-2011 at 2:00pm printed out calendar and gave to the patient

## 2011-06-26 NOTE — Progress Notes (Signed)
Hematology and Oncology Follow Up Visit  Stacey [name] [id number] [birth year] 71 y.o. 06/26/2011 2:52 PM PCP  Principle Diagnosis: 71 yo woman with hx of locally advanced lobular cancer currently on femara starting in 11/12.  Interim History:  There have been no intercurrent illness, hospitalizations or medication changes. She feels well.  Medications: I have reviewed the patient's current medications.  Allergies:  Allergies  Allergen Reactions  . Clindamycin/Lincomycin Diarrhea and Nausea And Vomiting  . Codeine     REACTION: nausea    Past Medical History, Surgical history, Social history, and Family History were reviewed and updated.  Review of Systems: Constitutional:  Negative for fever, chills, night sweats, anorexia, weight loss, pain. Cardiovascular: no chest pain or dyspnea on exertion Respiratory: no cough, shortness of breath, or wheezing Neurological: no TIA or stroke symptoms Dermatological: negative ENT: negative Skin Gastrointestinal: no abdominal pain, change in bowel habits, or black or bloody stools Genito-Urinary: no dysuria, trouble voiding, or hematuria Hematological and Lymphatic: negative Breast: negative for breast lumps, feels that breast mass is larger. Musculoskeletal: negative, c/o hand pain. Remaining ROS negative.  Physical Exam: Blood pressure 128/76, pulse 92, temperature 98.2 F (36.8 C), height 5' 3.75" (1.619 m), weight 141 lb 14.4 oz (64.365 kg). ECOG: 0 General appearance: alert, cooperative and appears stated age Head: Normocephalic, without obvious abnormality, atraumatic Neck: no adenopathy, no carotid bruit, no JVD, supple, symmetrical, trachea midline and thyroid not enlarged, symmetric, no tenderness/mass/nodules Lymph nodes: Cervical, supraclavicular, and axillary nodes normal. Cardiac : regular rate and rhythm, no murmurs or gallops Pulmonary:clear to auscultation bilaterally and normal percussion bilaterally Breasts: rt breast  mass ~ 2 cm  Central portion . Abdomen:soft, non-tender; bowel sounds normal; no masses,  no organomegaly Extremities negative Neuro: alert, oriented, normal speech, no focal findings or movement disorder noted  Lab Results: Lab Results  Component Value Date   WBC 5.9 03/06/2011   HGB 12.8 03/06/2011   HCT 37.6 03/06/2011   MCV 89.2 03/06/2011   PLT 300 03/06/2011     Chemistry      Component Value Date/Time   NA 144 03/06/2011 0840   NA 139 12/10/2010 0941   K 4.0 03/06/2011 0840   K 3.9 12/10/2010 0941   CL 106 03/06/2011 0840   CL 103 12/10/2010 0941   CO2 28 03/06/2011 0840   CO2 28 12/10/2010 0941   BUN 17 03/06/2011 0840   BUN 15 12/10/2010 0941   CREATININE 0.8 03/06/2011 0840   CREATININE 0.66 12/10/2010 0941      Component Value Date/Time   CALCIUM 8.5 03/06/2011 0840   CALCIUM 9.4 12/10/2010 0941   ALKPHOS 47 03/06/2011 0840   ALKPHOS 66 12/10/2010 0941   AST 30 03/06/2011 0840   AST 22 12/10/2010 0941   ALT 18 12/10/2010 0941   BILITOT 0.40 03/06/2011 0840   BILITOT 0.2* 12/10/2010 0941      .pathology. Radiological Studies: chest X-ray n/a Mammogram n/a Bone density n/a  Impression and Plan: Stacey [name] is doing well and tolerating femara well, I think her mass is slightly smaller . I plan on seeing her in 6 weeks.  Recent MRI confirms size of mass now down to 1.1x1.6x1.7. Of note is that her most recent bone density test shows stable osteopenia with a T score of -1.6  More than 50% of the visit was spent in patient-related counselling   [name], MD 5/24/20132:52 PM

## 2011-07-04 ENCOUNTER — Other Ambulatory Visit: Payer: Self-pay | Admitting: Internal Medicine

## 2011-07-16 ENCOUNTER — Other Ambulatory Visit (INDEPENDENT_AMBULATORY_CARE_PROVIDER_SITE_OTHER): Payer: Self-pay | Admitting: Surgery

## 2011-07-16 ENCOUNTER — Encounter (INDEPENDENT_AMBULATORY_CARE_PROVIDER_SITE_OTHER): Payer: Self-pay | Admitting: Surgery

## 2011-07-16 ENCOUNTER — Ambulatory Visit (INDEPENDENT_AMBULATORY_CARE_PROVIDER_SITE_OTHER): Payer: Medicare Other | Admitting: Surgery

## 2011-07-16 VITALS — BP 136/70 | HR 90 | Temp 97.2°F | Ht 63.75 in | Wt 142.8 lb

## 2011-07-16 DIAGNOSIS — C50919 Malignant neoplasm of unspecified site of unspecified female breast: Secondary | ICD-10-CM | POA: Diagnosis not present

## 2011-07-16 DIAGNOSIS — C50911 Malignant neoplasm of unspecified site of right female breast: Secondary | ICD-10-CM

## 2011-07-16 NOTE — Progress Notes (Addendum)
ASSESSMENT AND PLAN: 1.  Right breast cancer.  12 o'clock.  First seen 11/28/2010.  2.2 cm.  Lobular.  (Originally)  ER - 85%, PR - 57%, Ki67 - 20%, Her2Neu - neg.  She sees Drs. Rubin/Manning and is taking [name].  The plan is for neoadjuvant for 6 to 12 months. (Note: now at 8 months)  MRI 04/16/2011 - showed 1.7 x 1.6 x 1.1 cm lesion (about half original size)  But on my physical exam and [place] today, I think the mass has not improved over the last 4 months, she has been treated over 8 months, and I think it is time to go ahead with lumpectomy.      I discussed the surgical options of lumpectomy vs. mastectomy.  I discussed the options of lymph node biopsy.  The final treatment plan depends on the pathologic staging of the tumor and the patient's personal wishes.  The risks of surgery include, but are not limited to, bleeding, infection, the need for further surgery, and nerve injury.  The patient understands this. We will schedule the right lumpectomy and right SLNBx when convenient with her.   2.  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  IDC 2.5, 1/15 nodes positive  Treated with lumpectomy, axillary node dissection by Dr. Ivor Reining.  I discussed with the patient if she wanted Streck handling this cancer, but she is going to stay with me.  She last saw Streck in 01/22/2003.  3.  Anxiety. 4.  Hyperlipidemia. 5.  Had quit smoking.    But has restarted. 6.  COPD. 7.  Arthritis, on Celebrex.  The arthritis of her hands has acted up. 8.  She is having a second left posterior  tooth extracted and a planned implant.    She sees Dr. Cherlyn Cushing. 9.  Has a chronic cough she blames on post nasal drip (?cigarettes)  REFERRING PHYSICIAN: Oliver Barre, MD  HISTORY OF PRESENT ILLNESS: Stacey [name] is a 71 y.o. (DOB: 06-29-1940)  white female whose primary care physician is Oliver Barre, MD and comes to me today for folllow up of right breast cancer being treated with neoadjuvant [name].  The patient  had a left breast cancer Dr. Cicero Duck in 1997. She underwent chemotherapy supervised by Dr. Mancel Bale and has done well since that time.  Presented to Breast Ca Conf on 11/26/2010: 2 cm mass in right breast on [place], but on mammogram poorly seen because of density of breast tissue.  MRI shows 2.2 cm right breast mass Lobular features.    ER/PR pos, Ki67 - 20%, Her2Neu - neg  The MRI  04/16/2011 - showed 1.7 x 1.6 x 1.1 cm lesion (about half original size).  It appears by my exam and [place] that the tumor shrinkage has stalled.  We have now treated her about 8 months and I think it is time to go ahead with a right lumpectomy and right SLNBx.  I discussed with her the indications and complications of surgery.  I explained why she would need a SLNBx.  She will need a needle loc.  We will schedule this in the next 2 to 4 weeks.  She will hold her NSAIDs one week before surgery.   Past Medical History  Diagnosis Date  . ANEMIA-NOS 12/17/2006  . ANXIETY 05/03/2009  . CONSTIPATION 03/28/2010  . COPD 06/14/2008  . Cough 12/17/2006  . DIVERTICULOSIS, COLON 12/17/2006  . DYSPNEA 06/14/2008  . FATIGUE 06/14/2007  . GERD 03/28/2010  .  HYPERLIPIDEMIA 06/14/2007  . IRRITABLE BOWEL SYNDROME, HX OF 12/17/2006  . 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 Dyspnea   . Cancer     bilateral  . Breast cancer 1997    s/p chemo/xrt     Current Outpatient Prescriptions  Medication Sig Dispense Refill  . aspirin 81 MG EC tablet Take 81 mg by mouth daily.        . Calcium Carbonate-Vit D-Min (CALCIUM 600+D PLUS MINERALS) 600-400 MG-UNIT CHEW Chew by mouth daily.        . CELEBREX 200 MG capsule take 1 capsule by mouth twice a day if needed  60 capsule  8  . cholecalciferol (VITAMIN D) 1000 UNITS tablet Take 1,000 Units by mouth daily.        Marland Kitchen EPINEPHrine (EPIPEN) 0.3 mg/0.3 mL DEVI Use as directed       . fluticasone (FLONASE) 50 MCG/ACT nasal spray instill 2 sprays into each nostril once daily  16 g  1  .  letrozole ([name]) 2.5 MG tablet Take 1 tablet (2.5 mg total) by mouth daily.  30 tablet  11  . Loratadine (CLARITIN PO) Take by mouth.        . Multiple Vitamin (MULTIVITAMIN) capsule Take 1 capsule by mouth daily.        Marland Kitchen omeprazole (PRILOSEC) 20 MG capsule take 1 capsule by mouth once daily  30 capsule  0  . polyethylene glycol powder (GLYCOLAX/MIRALAX) powder use as directed  527 g  8  . pseudoephedrine (SUDAFED) 30 MG tablet Take 30 mg by mouth every 4 (four) hours as needed.      . zolpidem (AMBIEN) 10 MG tablet take 1 tablet by mouth at bedtime if needed  30 tablet  5  . cephALEXin (KEFLEX) 500 MG capsule         Allergies  Allergen Reactions  . Clindamycin/Lincomycin Diarrhea and Nausea And Vomiting  . Codeine     REACTION: nausea    REVIEW OF SYSTEMS:  Pulmonary:  Smoke cigarettes. Quit 1 week ago.  COPD.  But has started smoking again. Gastrointestinal:  No history of stomach disease.  No history of liver disease.  No history of gall bladder disease.  No history of pancreas disease.  No history of colon disease. Sees Dr. Kizzie Furnish.  Last colonoscopy 2004, for next one in 2014. Urologic:  No history of kidney stones.  No history of bladder infections. GYN:  Sees Dr. Renato Shin. Musculoskeletal:  Arthritis.  Takes Celebrex for this.  Dental:  Left posterior tooth trouble.  She is having more work on her left teeth.  SOCIAL and FAMILY HISTORY: Mother of Blondell Reveal (?). [name] with her. I took care of her mother, Darnelle Spangle.  PHYSICAL EXAM: BP 136/70  Pulse 90  Temp 97.2 F (36.2 C) (Temporal)  Ht 5' 3.75" (1.619 m)  Wt 142 lb 12.8 oz (64.774 kg)  BMI 24.70 kg/m2  SpO2 96%  General: WN WF  who is alert and generally healthy appearing.  HEENT: Normal. Pupils equal.  Neck: Supple. No mass.  No thyroid mass.  Carotid pulse okay with no bruit. Lungs:  Clear, despite cough. Lymph nodes:  No supraclavicular, cervical, or axillary Breasts:   Right: Mass, about  2.0 cm, at 12 o'clock.  I feel no adenopathy.  Left:  No mass Extremities:  Good strength and ROM  in upper and lower extremities. Neurologic:  Grossly intact to motor and sensory function. Psychiatric: Has normal mood and affect.  Behavior is normal.   Ultrasound: 1.9 x 1.3 x 1.0 cm mass at 12 o'clock in the right breast. One thing difficult about the [place] is that the tumor is in an area of fibrocystic changes, so the margins are indistinct.  DATA REVIEWED:  I repeated an office [place].  Ovidio Kin, M.D., [facility], [place] [phone]

## 2011-08-02 ENCOUNTER — Other Ambulatory Visit: Payer: Self-pay | Admitting: Internal Medicine

## 2011-08-04 ENCOUNTER — Other Ambulatory Visit: Payer: Self-pay | Admitting: Internal Medicine

## 2011-08-04 ENCOUNTER — Telehealth: Payer: Self-pay

## 2011-08-04 NOTE — Telephone Encounter (Signed)
Pt called requesting refills of medications. She states that she will be having cancer treatment and will not be able to make OV with PCP but since her records are available from oncology and surgeon it can be reviewed by JWJ, she is requesting refills x 6 months, please advise.

## 2011-08-04 NOTE — Telephone Encounter (Signed)
Ok this time but needs to be specific about which meds she needs

## 2011-08-04 NOTE — Telephone Encounter (Signed)
Called left detailed message to call us back on specific medications to be refill or call and inform pharmacy to let us know what she needs.

## 2011-08-20 ENCOUNTER — Encounter (HOSPITAL_COMMUNITY): Payer: Self-pay | Admitting: Pharmacy Technician

## 2011-08-26 ENCOUNTER — Ambulatory Visit (HOSPITAL_BASED_OUTPATIENT_CLINIC_OR_DEPARTMENT_OTHER): Payer: Medicare Other | Admitting: Oncology

## 2011-08-26 ENCOUNTER — Telehealth: Payer: Self-pay | Admitting: Oncology

## 2011-08-26 VITALS — BP 123/72 | HR 96 | Temp 98.4°F | Ht 63.57 in | Wt 143.6 lb

## 2011-08-26 DIAGNOSIS — C50919 Malignant neoplasm of unspecified site of unspecified female breast: Secondary | ICD-10-CM | POA: Diagnosis not present

## 2011-08-26 DIAGNOSIS — C50911 Malignant neoplasm of unspecified site of right female breast: Secondary | ICD-10-CM

## 2011-08-26 NOTE — Telephone Encounter (Signed)
gve the pt her sept 2013 appt and the appt to see dr [name] in aug at rad [facility]

## 2011-08-26 NOTE — Progress Notes (Signed)
Hematology and Oncology Follow Up Visit  Stacey [name] [id number] [birth year] 71 y.o. 08/26/2011 2:51 PM PCP  Principle Diagnosis: 71 yo woman with hx of locally advanced lobular cancer currently on femara starting in 11/12.  Interim History:  Since being seen last she had will be having surgery next week. She lumpectomy. In addition show a sentinel lymph node evaluation. She feels well.  Medications: I have reviewed the patient's current medications.  Allergies:  Allergies  Allergen Reactions  . Clindamycin/Lincomycin Diarrhea and Nausea And Vomiting  . Codeine Nausea And Vomiting    Past Medical History, Surgical history, Social history, and Family History were reviewed and updated.  Review of Systems: Constitutional:  Negative for fever, chills, night sweats, anorexia, weight loss, pain. Cardiovascular: no chest pain or dyspnea on exertion Respiratory: no cough, shortness of breath, or wheezing Neurological: no TIA or stroke symptoms Dermatological: negative ENT: negative Skin Gastrointestinal: no abdominal pain, change in bowel habits, or black or bloody stools Genito-Urinary: no dysuria, trouble voiding, or hematuria Hematological and Lymphatic: negative Breast: negative for breast lumps, feels that breast mass is larger. Musculoskeletal: negative, c/o hand pain. Remaining ROS negative.  Physical Exam: Blood pressure 123/72, pulse 96, temperature 98.4 F (36.9 C), height 5' 3.57" (1.615 m), weight 143 lb 9.6 oz (65.137 kg). ECOG: 0 General appearance: alert, cooperative and appears stated age Head: Normocephalic, without obvious abnormality, atraumatic Neck: no adenopathy, no carotid bruit, no JVD, supple, symmetrical, trachea midline and thyroid not enlarged, symmetric, no tenderness/mass/nodules Lymph nodes: Cervical, supraclavicular, and axillary nodes normal. Cardiac : regular rate and rhythm, no murmurs or gallops Pulmonary:clear to auscultation bilaterally and  normal percussion bilaterally Breasts: rt breast mass ~ 2 cm  Central portion . Abdomen:soft, non-tender; bowel sounds normal; no masses,  no organomegaly Extremities negative Neuro: alert, oriented, normal speech, no focal findings or movement disorder noted  Lab Results: Lab Results  Component Value Date   WBC 5.9 03/06/2011   HGB 12.8 03/06/2011   HCT 37.6 03/06/2011   MCV 89.2 03/06/2011   PLT 300 03/06/2011     Chemistry      Component Value Date/Time   NA 144 03/06/2011 0840   NA 139 12/10/2010 0941   K 4.0 03/06/2011 0840   K 3.9 12/10/2010 0941   CL 106 03/06/2011 0840   CL 103 12/10/2010 0941   CO2 28 03/06/2011 0840   CO2 28 12/10/2010 0941   BUN 17 03/06/2011 0840   BUN 15 12/10/2010 0941   CREATININE 0.8 03/06/2011 0840   CREATININE 0.66 12/10/2010 0941      Component Value Date/Time   CALCIUM 8.5 03/06/2011 0840   CALCIUM 9.4 12/10/2010 0941   ALKPHOS 47 03/06/2011 0840   ALKPHOS 66 12/10/2010 0941   AST 30 03/06/2011 0840   AST 22 12/10/2010 0941   ALT 18 12/10/2010 0941   BILITOT 0.40 03/06/2011 0840   BILITOT 0.2* 12/10/2010 0941      .pathology. Radiological Studies: chest X-ray n/a Mammogram n/a Bone density n/a  Impression and Plan: Ms Safley is doing well and tolerating femara well, I think her mass is slightly smaller . I plan on seeing her in 6 weeks. I indicated that she should see radiation therapy for a few months and certainly see her in 2 months time. I've given her response I do not think that she would need chemotherapy. I did discuss the option of Oncotype testing on the residual mass , theoretically if she were to be high-risk  for recurrence she might benefit from either chemotherapy. Given her age and previous experience with chemotherapy for his not that interested. She will call me after surgery we can discuss her pathology on the phone. Once the referral to radiation is been made I will see her in about 2 months from now. I have asked that she continue  [name].    More than 50% of the visit was spent in patient-related counselling   [name], MD 7/24/20132:51 PM

## 2011-08-28 ENCOUNTER — Ambulatory Visit (HOSPITAL_COMMUNITY)
Admission: RE | Admit: 2011-08-28 | Discharge: 2011-08-28 | Disposition: A | Discharge status: Home / Self Care | Payer: Medicare Other | Source: Ambulatory Visit | Attending: Surgery | Admitting: Surgery

## 2011-08-28 ENCOUNTER — Encounter (HOSPITAL_COMMUNITY): Payer: Self-pay

## 2011-08-28 ENCOUNTER — Encounter (HOSPITAL_COMMUNITY)
Admission: RE | Admit: 2011-08-28 | Discharge: 2011-08-28 | Disposition: A | Discharge status: Home / Self Care | Payer: Medicare Other | Source: Ambulatory Visit | Attending: Surgery | Admitting: Surgery

## 2011-08-28 DIAGNOSIS — Z01812 Encounter for preprocedural laboratory examination: Secondary | ICD-10-CM | POA: Insufficient documentation

## 2011-08-28 DIAGNOSIS — Z01811 Encounter for preprocedural respiratory examination: Secondary | ICD-10-CM | POA: Diagnosis not present

## 2011-08-28 DIAGNOSIS — I10 Essential (primary) hypertension: Secondary | ICD-10-CM | POA: Diagnosis not present

## 2011-08-28 DIAGNOSIS — Z01818 Encounter for other preprocedural examination: Secondary | ICD-10-CM | POA: Insufficient documentation

## 2011-08-28 DIAGNOSIS — Z853 Personal history of malignant neoplasm of breast: Secondary | ICD-10-CM | POA: Diagnosis not present

## 2011-08-28 LAB — CBC
Hemoglobin: 13.1 g/dL (ref 12.0–15.0)
RBC: 4.31 MIL/uL (ref 3.87–5.11)

## 2011-08-28 LAB — BASIC METABOLIC PANEL
CO2: 30 mEq/L (ref 19–32)
Chloride: 99 mEq/L (ref 96–112)
Glucose, Bld: 100 mg/dL — ABNORMAL HIGH (ref 70–99)
Potassium: 3.9 mEq/L (ref 3.5–5.1)
Sodium: 138 mEq/L (ref 135–145)

## 2011-08-28 LAB — SURGICAL PCR SCREEN: Staphylococcus aureus: NEGATIVE

## 2011-08-28 NOTE — Pre-Procedure Instructions (Signed)
20 Stacey [name]  08/28/2011   Your procedure is scheduled on:  09/03/11  Report to [facility] at 800 AM.  Call this number if you have problems the morning of surgery: [phone]   Remember:   Do not eat food:After Midnight.  May have clear liquids:until Midnight .  Clear liquids include soda, tea, black coffee, apple or grape juice, broth.  Take these medicines the morning of surgery with A SIP OF WATER: flonase,prilosec   Do not wear jewelry, make-up or nail polish.  Do not wear lotions, powders, or perfumes. You may wear deodorant.  Do not shave 48 hours prior to surgery. Men may shave face and neck.  Do not bring valuables to the hospital.  Contacts, dentures or bridgework may not be worn into surgery.  Leave suitcase in the car. After surgery it may be brought to your room.  For patients admitted to the hospital, checkout time is 11:00 AM the day of discharge.   Patients discharged the day of surgery will not be allowed to drive home.  Name and phone number of your driver: family  Special Instructions: CHG Shower Use Special Wash: 1/2 bottle night before surgery and 1/2 bottle morning of surgery.   Please read over the following fact sheets that you were given: Pain Booklet, Coughing and Deep Breathing, MRSA Information and Surgical Site Infection Prevention

## 2011-09-02 MED ORDER — CHLORHEXIDINE GLUCONATE 4 % EX LIQD: 1.0000 "application " | Freq: Once | CUTANEOUS | Status: DC

## 2011-09-02 MED ORDER — CEFAZOLIN SODIUM-DEXTROSE 2-3 GM-% IV SOLR
2.0000 g | INTRAVENOUS | Status: AC
  Administered 2011-09-03: 2 g via INTRAVENOUS
  Filled 2011-09-02: qty 50

## 2011-09-02 MED ORDER — CEFAZOLIN SODIUM 1-5 GM-% IV SOLN: 1.0000 g | INTRAVENOUS | Status: DC

## 2011-09-03 ENCOUNTER — Ambulatory Visit (HOSPITAL_COMMUNITY)
Admission: RE | Admit: 2011-09-03 | Discharge: 2011-09-03 | Disposition: A | Discharge status: Home / Self Care | Payer: Medicare Other | Source: Ambulatory Visit | Attending: Surgery | Admitting: Surgery

## 2011-09-03 ENCOUNTER — Ambulatory Visit (HOSPITAL_COMMUNITY): Payer: Medicare Other | Admitting: Anesthesiology

## 2011-09-03 ENCOUNTER — Encounter (HOSPITAL_COMMUNITY): Payer: Self-pay | Admitting: Anesthesiology

## 2011-09-03 ENCOUNTER — Encounter (HOSPITAL_COMMUNITY)
Admission: RE | Disposition: A | Discharge status: Home / Self Care | Payer: Self-pay | Source: Ambulatory Visit | Attending: Surgery

## 2011-09-03 DIAGNOSIS — D0590 Unspecified type of carcinoma in situ of unspecified breast: Secondary | ICD-10-CM | POA: Diagnosis not present

## 2011-09-03 DIAGNOSIS — C50219 Malignant neoplasm of upper-inner quadrant of unspecified female breast: Secondary | ICD-10-CM | POA: Diagnosis not present

## 2011-09-03 DIAGNOSIS — C773 Secondary and unspecified malignant neoplasm of axilla and upper limb lymph nodes: Secondary | ICD-10-CM | POA: Diagnosis not present

## 2011-09-03 DIAGNOSIS — Z0189 Encounter for other specified special examinations: Secondary | ICD-10-CM | POA: Diagnosis not present

## 2011-09-03 DIAGNOSIS — Z853 Personal history of malignant neoplasm of breast: Secondary | ICD-10-CM | POA: Insufficient documentation

## 2011-09-03 DIAGNOSIS — C50919 Malignant neoplasm of unspecified site of unspecified female breast: Secondary | ICD-10-CM

## 2011-09-03 DIAGNOSIS — J4489 Other specified chronic obstructive pulmonary disease: Secondary | ICD-10-CM | POA: Insufficient documentation

## 2011-09-03 DIAGNOSIS — J449 Chronic obstructive pulmonary disease, unspecified: Secondary | ICD-10-CM | POA: Insufficient documentation

## 2011-09-03 DIAGNOSIS — C50911 Malignant neoplasm of unspecified site of right female breast: Secondary | ICD-10-CM

## 2011-09-03 DIAGNOSIS — M129 Arthropathy, unspecified: Secondary | ICD-10-CM | POA: Diagnosis not present

## 2011-09-03 DIAGNOSIS — E785 Hyperlipidemia, unspecified: Secondary | ICD-10-CM | POA: Insufficient documentation

## 2011-09-03 SURGERY — BREAST LUMPECTOMY WITH NEEDLE LOCALIZATION AND AXILLARY SENTINEL LYMPH NODE BX
Anesthesia: General | Site: Breast | Laterality: Right | Wound class: Clean

## 2011-09-03 MED ORDER — METHYLENE BLUE 1 % INJ SOLN
INTRAMUSCULAR | Status: AC
  Filled 2011-09-03: qty 10

## 2011-09-03 MED ORDER — FENTANYL CITRATE 0.05 MG/ML IJ SOLN
INTRAMUSCULAR | Status: AC
  Filled 2011-09-03: qty 2

## 2011-09-03 MED ORDER — MIDAZOLAM HCL 2 MG/2ML IJ SOLN
1.0000 mg | INTRAMUSCULAR | Status: DC | PRN
  Administered 2011-09-03 (×2): 1 mg via INTRAVENOUS

## 2011-09-03 MED ORDER — ALBUTEROL SULFATE HFA 108 (90 BASE) MCG/ACT IN AERS
INHALATION_SPRAY | RESPIRATORY_TRACT | Status: DC | PRN
  Administered 2011-09-03: 2 via RESPIRATORY_TRACT

## 2011-09-03 MED ORDER — FENTANYL CITRATE 0.05 MG/ML IJ SOLN
INTRAMUSCULAR | Status: DC | PRN
  Administered 2011-09-03 (×2): 25 ug via INTRAVENOUS

## 2011-09-03 MED ORDER — ONDANSETRON HCL 4 MG/2ML IJ SOLN
INTRAMUSCULAR | Status: DC | PRN
  Administered 2011-09-03: 4 mg via INTRAVENOUS

## 2011-09-03 MED ORDER — HYDROMORPHONE HCL PF 1 MG/ML IJ SOLN
0.2500 mg | INTRAMUSCULAR | Status: DC | PRN
  Administered 2011-09-03: 0.5 mg via INTRAVENOUS

## 2011-09-03 MED ORDER — EPHEDRINE SULFATE 50 MG/ML IJ SOLN
INTRAMUSCULAR | Status: DC | PRN
  Administered 2011-09-03 (×3): 2.5 mg via INTRAVENOUS

## 2011-09-03 MED ORDER — OXYCODONE-ACETAMINOPHEN 5-325 MG PO TABS
ORAL_TABLET | ORAL | Status: AC
  Filled 2011-09-03: qty 2

## 2011-09-03 MED ORDER — HYDROMORPHONE HCL PF 1 MG/ML IJ SOLN
INTRAMUSCULAR | Status: AC
  Filled 2011-09-03: qty 1

## 2011-09-03 MED ORDER — BUPIVACAINE-EPINEPHRINE PF 0.25-1:200000 % IJ SOLN
INTRAMUSCULAR | Status: AC
  Filled 2011-09-03: qty 30

## 2011-09-03 MED ORDER — TECHNETIUM TC 99M SULFUR COLLOID FILTERED
1.0000 | Freq: Once | INTRAVENOUS | Status: AC | PRN
  Administered 2011-09-03: 1 via INTRADERMAL

## 2011-09-03 MED ORDER — SODIUM CHLORIDE 0.9 % IJ SOLN
INTRAMUSCULAR | Status: DC | PRN
  Administered 2011-09-03: 11:00:00

## 2011-09-03 MED ORDER — LACTATED RINGERS IV SOLN
INTRAVENOUS | Status: DC
  Administered 2011-09-03: 09:00:00 via INTRAVENOUS

## 2011-09-03 MED ORDER — LACTATED RINGERS IV SOLN
INTRAVENOUS | Status: DC | PRN
  Administered 2011-09-03: 09:00:00 via INTRAVENOUS

## 2011-09-03 MED ORDER — PROPOFOL 10 MG/ML IV EMUL
INTRAVENOUS | Status: DC | PRN
  Administered 2011-09-03: 140 mg via INTRAVENOUS

## 2011-09-03 MED ORDER — 0.9 % SODIUM CHLORIDE (POUR BTL) OPTIME
TOPICAL | Status: DC | PRN
  Administered 2011-09-03: 1000 mL

## 2011-09-03 MED ORDER — OXYCODONE-ACETAMINOPHEN 5-325 MG PO TABS
1.0000 | ORAL_TABLET | Freq: Once | ORAL | Status: AC
  Administered 2011-09-03: 2 via ORAL

## 2011-09-03 MED ORDER — MIDAZOLAM HCL 2 MG/2ML IJ SOLN
INTRAMUSCULAR | Status: AC
  Filled 2011-09-03: qty 2

## 2011-09-03 MED ORDER — TRAMADOL HCL 50 MG PO TABS: 50.0000 mg | ORAL_TABLET | Freq: Four times a day (QID) | ORAL | Status: AC | PRN

## 2011-09-03 MED ORDER — PROMETHAZINE HCL 25 MG/ML IJ SOLN: 6.2500 mg | INTRAMUSCULAR | Status: DC | PRN

## 2011-09-03 MED ORDER — BUPIVACAINE-EPINEPHRINE 0.25% -1:200000 IJ SOLN
INTRAMUSCULAR | Status: DC | PRN
  Administered 2011-09-03: 30 mL

## 2011-09-03 MED ORDER — LIDOCAINE HCL (CARDIAC) 20 MG/ML IV SOLN
INTRAVENOUS | Status: DC | PRN
  Administered 2011-09-03: 80 mg via INTRAVENOUS

## 2011-09-03 MED ORDER — FENTANYL CITRATE 0.05 MG/ML IJ SOLN
50.0000 ug | INTRAMUSCULAR | Status: DC | PRN
  Administered 2011-09-03 (×2): 50 ug via INTRAVENOUS

## 2011-09-03 SURGICAL SUPPLY — 48 items
ADH SKN CLS APL DERMABOND .7 (GAUZE/BANDAGES/DRESSINGS) ×1
APPLIER CLIP 9.375 MED OPEN (MISCELLANEOUS)
APPLIER CLIP 9.375 SM OPEN (CLIP)
APR CLP MED 9.3 20 MLT OPN (MISCELLANEOUS)
APR CLP SM 9.3 20 MLT OPN (CLIP)
BINDER BREAST LRG (GAUZE/BANDAGES/DRESSINGS) ×1 IMPLANT
BINDER BREAST XLRG (GAUZE/BANDAGES/DRESSINGS) IMPLANT
CANISTER SUCTION 2500CC (MISCELLANEOUS) ×2 IMPLANT
CHLORAPREP W/TINT 26ML (MISCELLANEOUS) ×2 IMPLANT
CLIP APPLIE 9.375 MED OPEN (MISCELLANEOUS) ×1 IMPLANT
CLIP APPLIE 9.375 SM OPEN (CLIP) IMPLANT
CLIP TI WIDE RED SMALL 6 (CLIP) ×2 IMPLANT
CLOTH BEACON ORANGE TIMEOUT ST (SAFETY) ×2 IMPLANT
CONT SPEC 4OZ CLIKSEAL STRL BL (MISCELLANEOUS) ×3 IMPLANT
COVER PROBE W GEL 5X96 (DRAPES) ×2 IMPLANT
COVER SURGICAL LIGHT HANDLE (MISCELLANEOUS) ×2 IMPLANT
DERMABOND ADVANCED (GAUZE/BANDAGES/DRESSINGS) ×1
DERMABOND ADVANCED .7 DNX12 (GAUZE/BANDAGES/DRESSINGS) ×1 IMPLANT
DEVICE DUBIN SPECIMEN MAMMOGRA (MISCELLANEOUS) ×2 IMPLANT
DRAPE CHEST BREAST 15X10 FENES (DRAPES) ×2 IMPLANT
DRAPE PROXIMA HALF (DRAPES) ×2 IMPLANT
DRAPE UTILITY 15X26 W/TAPE STR (DRAPE) ×4 IMPLANT
ELECT COATED BLADE 2.86 ST (ELECTRODE) ×2 IMPLANT
ELECT REM PT RETURN 9FT ADLT (ELECTROSURGICAL) ×2
ELECTRODE REM PT RTRN 9FT ADLT (ELECTROSURGICAL) ×1 IMPLANT
GLOVE BIO SURGEON STRL SZ7.5 (GLOVE) ×1 IMPLANT
GLOVE BIOGEL PI IND STRL 7.0 (GLOVE) IMPLANT
GLOVE BIOGEL PI INDICATOR 7.0 (GLOVE) ×1
GLOVE SURG SIGNA 7.5 PF LTX (GLOVE) ×3 IMPLANT
GLOVE SURG SS PI 7.0 STRL IVOR (GLOVE) ×2 IMPLANT
GOWN STRL NON-REIN LRG LVL3 (GOWN DISPOSABLE) ×2 IMPLANT
GOWN STRL REIN XL XLG (GOWN DISPOSABLE) ×2 IMPLANT
KIT BASIN OR (CUSTOM PROCEDURE TRAY) ×2 IMPLANT
KIT MARKER MARGIN INK (KITS) ×1 IMPLANT
KIT ROOM TURNOVER OR (KITS) ×2 IMPLANT
NDL 18GX1X1/2 (RX/OR ONLY) (NEEDLE) ×1 IMPLANT
NDL HYPO 25GX1X1/2 BEV (NEEDLE) ×2 IMPLANT
NEEDLE 18GX1X1/2 (RX/OR ONLY) (NEEDLE) ×2 IMPLANT
NEEDLE HYPO 25GX1X1/2 BEV (NEEDLE) ×4 IMPLANT
NS IRRIG 1000ML POUR BTL (IV SOLUTION) ×2 IMPLANT
PACK GENERAL/GYN (CUSTOM PROCEDURE TRAY) ×2 IMPLANT
PAD ARMBOARD 7.5X6 YLW CONV (MISCELLANEOUS) ×2 IMPLANT
STAPLER VISISTAT 35W (STAPLE) ×1 IMPLANT
SUT MON AB 5-0 PS2 18 (SUTURE) ×2 IMPLANT
SUT VIC AB 3-0 SH 18 (SUTURE) ×2 IMPLANT
SYR CONTROL 10ML LL (SYRINGE) ×4 IMPLANT
TOWEL OR 17X24 6PK STRL BLUE (TOWEL DISPOSABLE) ×2 IMPLANT
TOWEL OR 17X26 10 PK STRL BLUE (TOWEL DISPOSABLE) ×2 IMPLANT

## 2011-09-03 NOTE — Progress Notes (Signed)
Pt. Requesting prescription for percocet. Dr. Ezzard Standing notified. He will leave a prescription at his office for the pt, or family member to pick up.

## 2011-09-03 NOTE — Anesthesia Postprocedure Evaluation (Signed)
  Anesthesia Post-op Note  Patient: Stacey [name]  Procedure(s) Performed: Procedure(s) (LRB): BREAST LUMPECTOMY WITH NEEDLE LOCALIZATION AND AXILLARY SENTINEL LYMPH NODE BX (Right)  Patient Location: PACU  Anesthesia Type: General  Level of Consciousness: awake and alert   Airway and Oxygen Therapy: Patient Spontanous Breathing  Post-op Pain: mild  Post-op Assessment: Post-op Vital signs reviewed, Patient's Cardiovascular Status Stable, Respiratory Function Stable, Patent Airway, No signs of Nausea or vomiting and Pain level controlled  Post-op Vital Signs: stable  Complications: No apparent anesthesia complications

## 2011-09-03 NOTE — H&P (Signed)
ASSESSMENT AND PLAN:  1. Right breast cancer. 12 o'clock. First seen 11/28/2010.   2.2 cm. Lobular. (Originally)   ER - 85%, PR - 57%, Ki67 - 20%, Her2Neu - neg.   She sees Drs. Rubin/Manning and is taking [name]. The plan is for neoadjuvant for 6 to 12 months. (Note: now at 8 months)   MRI 04/16/2011 - showed 1.7 x 1.6 x 1.1 cm lesion (about half original size)   But on my physical exam and [place] today, I think the mass has not improved over the last 4 months, she has been treated over 8 months, and I think it is time to go ahead with lumpectomy.   I discussed the surgical options of lumpectomy vs. mastectomy. I discussed the options of lymph node biopsy. The final treatment plan depends on the pathologic staging of the tumor and the patient's personal wishes.   The risks of surgery include, but are not limited to, bleeding, infection, the need for further surgery, and nerve injury.   The patient understands this. We will schedule the right lumpectomy and right SLNBx when convenient with her.   She has the wire localization.  [name], with her today.  2. 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   IDC 2.5, 1/15 nodes positive   Treated with lumpectomy, axillary node dissection by Dr. Ivor Reining.   I discussed with the patient if she wanted Streck handling this cancer, but she is going to stay with me.   She last saw Streck in 01/22/2003.  3. Anxiety.  4. Hyperlipidemia.  5. Had quit smoking.   But has restarted.  6. COPD.  7. Arthritis, on Celebrex.   Right hand arthritis acting up today. 8. She is having a second left posterior tooth extracted and a planned implant.   She sees Dr. Cherlyn Cushing.  9. Has a chronic cough she blames on post nasal drip (?cigarettes)   REFERRING PHYSICIAN: Oliver Barre, MD   HISTORY OF PRESENT ILLNESS:  Stacey [name] is a 71 y.o. (DOB: May 12, 1940) white female whose primary care physician is Oliver Barre, MD and comes to me today for folllow up of right breast  cancer being treated with neoadjuvant [name].  The patient had a left breast cancer Dr. Cicero Duck in 1997. She underwent chemotherapy supervised by Dr. Mancel Bale and has done well since that time.   Presented to Breast Ca Conf on 11/26/2010: 2 cm mass in right breast on [place], but on mammogram poorly seen because of density of breast tissue. MRI shows 2.2 cm right breast mass Lobular features. ER/PR pos, Ki67 - 20%, Her2Neu - neg  The MRI 04/16/2011 - showed 1.7 x 1.6 x 1.1 cm lesion (about half original size).   It appears by my exam and [place] that the tumor shrinkage has stalled. We have now treated her about 8 months and I think it is time to go ahead with a right lumpectomy and right SLNBx.  I discussed with her the indications and complications of surgery. I explained why she would need a SLNBx. She will need a needle loc. We will schedule this in the next 2 to 4 weeks. She will hold her NSAIDs one week before surgery.   Past Medical History   Diagnosis  Date   .  ANEMIA-NOS  12/17/2006   .  ANXIETY  05/03/2009   .  CONSTIPATION  03/28/2010   .  COPD  06/14/2008   .  Cough  12/17/2006   .  DIVERTICULOSIS, COLON  12/17/2006   .  DYSPNEA  06/14/2008   .  FATIGUE  06/14/2007   .  GERD  03/28/2010   .  HYPERLIPIDEMIA  06/14/2007   .  IRRITABLE BOWEL SYNDROME, HX OF  12/17/2006   .  OSTEOARTHRITIS, HAND  06/26/2009   .  Dyspnea    .  Cancer      bilateral   .  Breast cancer  1997     s/p chemo/xrt    Current Outpatient Prescriptions   Medication  Sig  Dispense  Refill   .  aspirin 81 MG EC tablet  Take 81 mg by mouth daily.     .  Calcium Carbonate-Vit D-Min (CALCIUM 600+D PLUS MINERALS) 600-400 MG-UNIT CHEW  Chew by mouth daily.     .  CELEBREX 200 MG capsule  take 1 capsule by mouth twice a day if needed  60 capsule  8   .  cholecalciferol (VITAMIN D) 1000 UNITS tablet  Take 1,000 Units by mouth daily.     Marland Kitchen  EPINEPHrine (EPIPEN) 0.3 mg/0.3 mL DEVI  Use as directed     .  fluticasone  (FLONASE) 50 MCG/ACT nasal spray  instill 2 sprays into each nostril once daily  16 g  1   .  letrozole ([name]) 2.5 MG tablet  Take 1 tablet (2.5 mg total) by mouth daily.  30 tablet  11   .  Loratadine (CLARITIN PO)  Take by mouth.     .  Multiple Vitamin (MULTIVITAMIN) capsule  Take 1 capsule by mouth daily.     Marland Kitchen  omeprazole (PRILOSEC) 20 MG capsule  take 1 capsule by mouth once daily  30 capsule  0   .  polyethylene glycol powder (GLYCOLAX/MIRALAX) powder  use as directed  527 g  8   .  pseudoephedrine (SUDAFED) 30 MG tablet  Take 30 mg by mouth every 4 (four) hours as needed.     .  zolpidem (AMBIEN) 10 MG tablet  take 1 tablet by mouth at bedtime if needed  30 tablet  5   .  cephALEXin (KEFLEX) 500 MG capsule       Allergies   Allergen  Reactions   .  Clindamycin/Lincomycin  Diarrhea and Nausea And Vomiting   .  Codeine      REACTION: nausea    REVIEW OF SYSTEMS:  Pulmonary: Smoke cigarettes. Quit 1 week ago. COPD. But has started smoking again.  Gastrointestinal: No history of stomach disease. No history of liver disease. No history of gall bladder disease. No history of pancreas disease. No history of colon disease. Sees Dr. Kizzie Furnish. Last colonoscopy 2004, for next one in 2014.  Urologic: No history of kidney stones. No history of bladder infections.  GYN: Sees Dr. Renato Shin.  Musculoskeletal: Arthritis. Takes Celebrex for this.  Dental: Left posterior tooth trouble. She is having more work on her left teeth.   SOCIAL and FAMILY HISTORY:  Mother of Stacey [name] (?).  [name] with her.  I took care of her mother, Stacey [name].   PHYSICAL EXAM:  BP 134/82  Pulse 79  Temp 98 F (36.7 C)  Resp 16  SpO2 98%  BMI 24.70 kg/m2  SpO2 96%  General: WN WF who is alert and generally healthy appearing.  HEENT: Normal. Pupils equal.  Neck: Supple. No mass. No thyroid mass. Carotid pulse okay with no bruit.  Lungs: Clear, despite cough.  Lymph nodes: No supraclavicular,  cervical, or axillary  Breasts: Right: Mass, about 2.0 cm, at 12 o'clock. I feel no adenopathy.  Left: No mass  Extremities: Good strength and ROM in upper and lower extremities.  Neurologic: Grossly intact to motor and sensory function.  Psychiatric: Has normal mood and affect. Behavior is normal.   Ultrasound: 1.9 x 1.3 x 1.0 cm mass at 12 o'clock in the right breast. One thing difficult about the [place] is that the tumor is in an area of fibrocystic changes, so the margins are indistinct.   DATA REVIEWED:  I repeated an office [place].   Ovidio Kin, M.D., [facility], [place]  [phone]

## 2011-09-03 NOTE — Preoperative (Signed)
Beta Blockers   Reason not to administer Beta Blockers:Not Applicable 

## 2011-09-03 NOTE — Transfer of Care (Signed)
Immediate Anesthesia Transfer of Care Note  Patient: Stacey [name]  Procedure(s) Performed: Procedure(s) (LRB): BREAST LUMPECTOMY WITH NEEDLE LOCALIZATION AND AXILLARY SENTINEL LYMPH NODE BX (Right)  Patient Location: PACU  Anesthesia Type: General  Level of Consciousness: awake and oriented  Airway & Oxygen Therapy: Patient Spontanous Breathing and Patient connected to nasal cannula oxygen  Post-op Assessment: Report given to PACU RN and Post -op Vital signs reviewed and stable  Post vital signs: Reviewed and stable  Complications: No apparent anesthesia complications

## 2011-09-03 NOTE — Anesthesia Preprocedure Evaluation (Addendum)
Anesthesia Evaluation  Patient identified by MRN, date of birth, ID band Patient awake    Reviewed: Allergy & Precautions, H&P , NPO status , Patient's Chart, lab work & pertinent test results  Airway Mallampati: I TM Distance: >3 FB Neck ROM: Full    Dental   Pulmonary shortness of breath and with exertion, COPD   Pulmonary exam normal       Cardiovascular     Neuro/Psych Anxiety negative neurological ROS     GI/Hepatic GERD-  Medicated and Controlled,  Endo/Other    Renal/GU      Musculoskeletal   Abdominal   Peds  Hematology   Anesthesia Other Findings   Reproductive/Obstetrics                          Anesthesia Physical Anesthesia Plan  ASA: II  Anesthesia Plan: General   Post-op Pain Management:    Induction: Intravenous  Airway Management Planned: LMA  Additional Equipment:   Intra-op Plan:   Post-operative Plan: Extubation in OR  Informed Consent: I have reviewed the patients History and Physical, chart, labs and discussed the procedure including the risks, benefits and alternatives for the proposed anesthesia with the patient or authorized representative who has indicated his/her understanding and acceptance.   Dental advisory given  Plan Discussed with: CRNA and Surgeon  Anesthesia Plan Comments:        Anesthesia Quick Evaluation

## 2011-09-03 NOTE — Anesthesia Procedure Notes (Signed)
Performed by: Ayyan Sites K     

## 2011-09-03 NOTE — Op Note (Signed)
Stacey [name], Stacey [name] NO.:  [id number]  MEDICAL RECORD NO.:  [id number]  LOCATION:  [facility]                         FACILITY:  [facility]  PHYSICIAN:  Sandria Bales. Ezzard Standing, M.D.  DATE OF BIRTH:  January 22, 1941  DATE OF PROCEDURE:  09/03/2011                              OPERATIVE REPORT  PREOPERATIVE DIAGNOSIS:  Right breast cancer.  POSTOPERATIVE DIAGNOSIS:  Right breast cancer at 1 o'clock position (T1,N0).  PROCEDURE:  Right breast lumpectomy with wire localization, excision of lateral-inferior margin, and right axillary sentinel lymph node biopsy.  SURGEON:  Sandria Bales. Ezzard Standing, MD  FIRST ASSISTANT:  None.  ANESTHESIA:  General endotracheal.  ESTIMATED BLOOD LOSS:  50 mL.  DRAINS:  Left in were none.  INDICATION FOR PROCEDURE:  Stacey [name] is a 71 year old white female who sees Dr. Oliver Barre as her primary care doctor.  She was diagnosed with a right breast cancer in October 2012 which was lobular with ER was 85% and Her2neu was negative.  She was placed on [name] and seemed to get about a 30% reduction in her tumor from 2.2 cm to about 1.7 cm.  However, over the last 4 months, she appears to have no further improvement in this tumor and we plan to proceed with right breast lumpectomy.  I also discussed with her  sentinel lymph node biopsy.  I discussed the indications and risks of surgery.  The risks of surgery include, but are not limited to, bleeding, infection, nerve injury, and the possibility of further surgery.  OPERATIVE NOTE:  The patient had a wire placed in her right breast at [facility].  In the preop holding area, her right nipple was injected with 1 millicuries of technetium sulfur colloid.  She was then taken to the operating room #1, where she underwent a general anesthesia supervised by Dr. Bedelia Person.  A time-out was held and surgical checklist run.    I then injected her right breast with about a 0.5 mL of 40% methylene blue.  The right breast and right axilla  were then prepped with ChloraPrep and sterilely draped.  I first went to the right axilla.  She had a hot node identified.  I made about 4 cm incision at the junction of the tail of axillary Spence and the pectoralis major muscle and identified the hot blue node.  The area looked like 3 nodes which had counts of 5000, the background that was 0, except one area where there were counts about 100.  One of the nodes were blue.  This was sent for permanent pathology for dialysis.  I then turned my attention to her right breast.  The wire entered the skin at about the 4 o'clock position in the right breast, however the tumor itself was up about 1 o'clock position, so the wire was about 7 cm remote from the tumor.  I made an elliptical incision taking out some skin in the upper inner aspect of her right breast.  I took out a block of breast tissue approximately 6-7 cm diameter down to the chest wall.  I then took the specimen.  I painted with 6 color paint kit for orientation.  I did a specimen mammogram which confirmed the clip and the wire were in the proper location.  This was sent to Pathology for permanent analysis.  Her breast tissue is very dense.  The closest margin was lateral inferior edge of my biopsy specimen, so I took an additional half a centimeter margin in the lateral inferior aspect of the biopsy cavity.  There was no gross or obvious tumor that was near.  The margin excision was also painted medially and laterally and suture placed in the specimen anteriorly.  The wounds were then irrigated with saline.  I infiltrated 30 mL of 0.25% Marcaine in the biopsy site and the axillary site.  I then closed. I then placed clips to outline the biopsy cavity with clips at 12 o'clock, 3 o'clock, 6 o'clock, and 9 o'clock with 2 clips on the chest wall.  I then closed both wounds in layers of 3-0 Vicryl sutures and the skin with a 5-0 Monocryl suture.  I painted the wound with Dermabond. She  had a breast binder placed and was transferred to recovery room in good condition.  The patient tolerated the procedure well.  Sponge and needle count were correct at the end of the case.    She has 3 specimens, actually 2 breast biopsy specimens[lateral being out towards the arm] and then the sentinel lymph node biopsy.   Sandria Bales. Ezzard Standing, M.D., FACS   DHN/MEDQ  D:  09/03/2011  T:  09/03/2011  Job:  [id number]  cc:   [name], MD [name], M.D., F.R.C.P.C. Oneita Hurt, M.D.

## 2011-09-03 NOTE — Brief Op Note (Signed)
09/03/2011  11:48 AM  PATIENT:  Stacey [name], 71 y.o., female, MRN: [id number]  PREOP DIAGNOSIS:  RIGHT BREAST CANCER  POSTOP DIAGNOSIS:   Right breast cancer, 1 o'clock. (T1, N0)  PROCEDURE:   Procedure(s):  Right BREAST LUMPECTOMY WITH NEEDLE LOCALIZATION AND Right AXILLARY SENTINEL LYMPH NODE BX  SURGEON:   Ovidio Kin, M.D.  ASSISTANT:   None  ANESTHESIA:   general  Bedelia Person, MD - Anesthesiologist Randel K Temples, CRNA - CRNA  General  EBL:  50  ml  BLOOD ADMINISTERED: none  DRAINS: none   LOCAL MEDICATIONS USED:   30 cc 1/4% marcaine with epi  SPECIMEN:   Right breast lumpectomy, right lateral-inferior margin, right axillary SLNBx (Hot 5000/100 and blue)  COUNTS CORRECT:  YES  INDICATIONS FOR PROCEDURE:  Stacey [name] is a 71 y.o. (DOB: 1940-12-25) white female whose primary care physician is Oliver Barre, MD and comes for right breast lumpectomy and right axillary sentinel lymph node biopsy.   The indications and risks of the surgery were explained to the patient.  The risks include, but are not limited to, infection, bleeding, and nerve injury.  Note dictated to:   #[id number]

## 2011-09-07 ENCOUNTER — Other Ambulatory Visit: Payer: Self-pay | Admitting: Internal Medicine

## 2011-09-14 ENCOUNTER — Encounter: Payer: Self-pay | Admitting: Internal Medicine

## 2011-09-14 ENCOUNTER — Ambulatory Visit (INDEPENDENT_AMBULATORY_CARE_PROVIDER_SITE_OTHER): Payer: Medicare Other | Admitting: Internal Medicine

## 2011-09-14 VITALS — BP 124/68 | HR 74 | Temp 97.5°F | Ht 63.0 in | Wt 147.1 lb

## 2011-09-14 DIAGNOSIS — R5383 Other fatigue: Secondary | ICD-10-CM

## 2011-09-14 DIAGNOSIS — R5381 Other malaise: Secondary | ICD-10-CM

## 2011-09-14 DIAGNOSIS — M25511 Pain in right shoulder: Secondary | ICD-10-CM

## 2011-09-14 DIAGNOSIS — M25519 Pain in unspecified shoulder: Secondary | ICD-10-CM | POA: Diagnosis not present

## 2011-09-14 DIAGNOSIS — E785 Hyperlipidemia, unspecified: Secondary | ICD-10-CM | POA: Diagnosis not present

## 2011-09-14 DIAGNOSIS — F411 Generalized anxiety disorder: Secondary | ICD-10-CM

## 2011-09-14 MED ORDER — OMEPRAZOLE 20 MG PO CPDR: 20.0000 mg | DELAYED_RELEASE_CAPSULE | Freq: Every day | ORAL | Status: DC

## 2011-09-14 MED ORDER — CELECOXIB 200 MG PO CAPS: 200.0000 mg | ORAL_CAPSULE | Freq: Two times a day (BID) | ORAL | Status: DC | PRN

## 2011-09-14 NOTE — Assessment & Plan Note (Signed)
C/w msk strain, for nsaid prn,  to f/u any worsening symptoms or concerns  

## 2011-09-14 NOTE — Assessment & Plan Note (Signed)
Etiology unclear, Exam otherwise benign, to check labs as documented, follow with expectant management  

## 2011-09-14 NOTE — Patient Instructions (Addendum)
Continue all other medications as before Your refills were done today as planned Please go to LAB in the Basement for the blood and/or urine tests to be done at your convenience You will be contacted by phone if any changes need to be made immediately.  Otherwise, you will receive a letter about your results with an explanation. Please return in 1 year for your yearly visit, or sooner if needed

## 2011-09-14 NOTE — Assessment & Plan Note (Signed)
stable overall by hx and exam, most recent data reviewed with pt, and pt to continue medical treatment as before Lab Results  Component Value Date   LDLCALC 84 07/04/2009

## 2011-09-14 NOTE — Assessment & Plan Note (Signed)
stable overall by hx and exam, most recent data reviewed with pt, and pt to continue medical treatment as before Lab Results  Component Value Date   WBC 9.3 08/28/2011   HGB 13.1 08/28/2011   HCT 38.0 08/28/2011   PLT 283 08/28/2011   GLUCOSE 100* 08/28/2011   CHOL 229* 07/29/2010   TRIG 241.0* 07/29/2010   HDL 77.40 07/29/2010   LDLDIRECT 118.4 07/29/2010   LDLCALC 84 07/04/2009   ALT 18 12/10/2010   AST 30 03/06/2011   NA 138 08/28/2011   K 3.9 08/28/2011   CL 99 08/28/2011   CREATININE 0.75 08/28/2011   BUN 18 08/28/2011   CO2 30 08/28/2011   TSH 1.18 07/29/2010

## 2011-09-14 NOTE — Progress Notes (Signed)
Subjective:    Patient ID: Stacey [name], female    DOB: 07-03-1940, 71 y.o.   MRN: [id number]  HPI  Here after recent surgury July 2013 for right breast cancer, was on hrt pror to reduce size, mult scans, surgury aug 1, with postop f/u with Dr Ezzard Standing planned soon, with plan for XRT after most likely.  Pt denies chest pain, increased sob or doe, wheezing, orthopnea, PND, increased LE swelling, palpitations, dizziness or syncope.  Pt denies new neurological symptoms such as new headache, or facial or extremity weakness or numbness   Pt denies polydipsia, polyuria,.  Pt states overall good compliance with meds, trying to follow lower cholesterol, diabetic diet, wt overall stable.  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  No new neck pain or radicular symptoms.  No distal RUE weakness or numbness. No trauma or falls.  Did have to lift trays of cookes occasionaly as well, up to 10 lbs but mult times per day, and twisting at the waist.  NO prior hx of shoulder issues, MRI, surgury, PT or surgical evaluation.  Has occasional right hand OA pain, as well as hx of tendonitis to right lateral epicondyl with cortisone but not active now.  Denies worsening depressive symptoms, suicidal ideation, or panic Past Medical History  Diagnosis Date  . ANEMIA-NOS 12/17/2006  . ANXIETY 05/03/2009  . CONSTIPATION 03/28/2010  . COPD 06/14/2008  . Cough 12/17/2006  . DIVERTICULOSIS, COLON 12/17/2006  . DYSPNEA 06/14/2008  . FATIGUE 06/14/2007  . GERD 03/28/2010  . HYPERLIPIDEMIA 06/14/2007  . IRRITABLE BOWEL SYNDROME, HX OF 12/17/2006  . 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 Dyspnea   . Cancer     bilateral  . Breast cancer 1997    s/p chemo/xrt   Past  Surgical History  Procedure Date  . Appendectomy   . Tonsillectomy   . Rectal fissure repair   . Abdominal hysterectomy   . Inguinal herniorrhapy 1984    left  . Tmj arthroplasty 1989  . S/p lumpectomy 1997    melignant left x 2  . S/p benign breast biopsy 2003    right  . S/p left foot surgury 2009  . Spiral fx left foot 2008    no surgury  . Breast surgery     lumpectomy  . Breast biopsy 11/14/10     r breast: inv, insitu mammary carcinoma w/calcif, er/pr +, her2 -  . Hernia repair     reports that she has been smoking Cigarettes.  She has a 10 pack-year smoking history. She does not have any smokeless tobacco history on file. She reports that she drinks alcohol. She reports that she does not use illicit drugs. family history includes Alcohol abuse in her mother; Breast cancer in her cousin; Cancer in her brother and mother; Colon polyps in her mother; Diabetes in her mother; Hypertension in her mother; Lymphoma in her brothers; Pancreatic cancer in her mother; and Stroke in her mother. Allergies  Allergen Reactions  . Clindamycin/Lincomycin Diarrhea and Nausea And Vomiting  . Codeine Nausea And Vomiting   Current Outpatient Prescriptions on File Prior to Visit  Medication Sig Dispense Refill  . aspirin 81 MG EC tablet Take 81 mg by  mouth daily.        . calcium-vitamin D (OSCAL WITH D) 500-200 MG-UNIT per tablet Take 1 tablet by mouth daily.      . CELEBREX 200 MG capsule take 1 capsule by mouth twice a day if needed  60 capsule  8  . cholecalciferol (VITAMIN D) 1000 UNITS tablet Take 1,000 Units by mouth daily.        Marland Kitchen EPINEPHrine (EPIPEN) 0.3 mg/0.3 mL DEVI Use as directed       . fluticasone (FLONASE) 50 MCG/ACT nasal spray       . letrozole ([name]) 2.5 MG tablet Take 1 tablet (2.5 mg total) by mouth daily.  30 tablet  11  . Multiple Vitamin (MULTIVITAMIN) capsule Take 1 capsule by mouth daily.        Marland Kitchen omeprazole (PRILOSEC) 20 MG capsule take 1 capsule by mouth once  daily  30 capsule  0  . polyethylene glycol (MIRALAX / GLYCOLAX) packet Take 17 g by mouth daily.      . pseudoephedrine (SUDAFED) 30 MG tablet Take 30 mg by mouth daily.       Marland Kitchen zolpidem (AMBIEN) 10 MG tablet Take 5 mg by mouth at bedtime as needed. For sleep      . traMADol (ULTRAM) 50 MG tablet Take 1-2 tablets (50-100 mg total) by mouth every 6 (six) hours as needed for pain.  30 tablet  1   Review of Systems Review of Systems  Constitutional: Negative for diaphoresis and unexpected weight change.  HENT: Negative for drooling and tinnitus.   Eyes: Negative for photophobia and visual disturbance.  Respiratory: Negative for choking and stridor.   Gastrointestinal: Negative for vomiting and blood in stool.  Genitourinary: Negative for hematuria and decreased urine volume.  Musculoskeletal: Negative for gait problem.  Skin: Negative for color change and wound.  Neurological: Negative for tremors and numbness.  Psychiatric/Behavioral: Negative for decreased concentration. The patient is not hyperactive.       Objective:   Physical Exam BP 124/68  Pulse 74  Temp 97.5 F (36.4 C) (Oral)  Ht 5\' 3"  (1.6 m)  Wt 147 lb 2 oz (66.735 kg)  BMI 26.06 kg/m2  SpO2 96% Physical Exam  VS noted Constitutional: Pt is oriented to person, place, and time. Appears well-developed and well-nourished.  HENT:  Head: Normocephalic and atraumatic.  Right Ear: External ear normal.  Left Ear: External ear normal.  Nose: Nose normal.  Mouth/Throat: Oropharynx is clear and moist.  Eyes: Conjunctivae and EOM are normal. Pupils are equal, round, and reactive to light.  Neck: Normal range of motion. Neck supple. No JVD present. No tracheal deviation present.  Cardiovascular: Normal rate, regular rhythm, normal heart sounds and intact distal pulses.   Pulmonary/Chest: Effort normal and breath sounds normal.  Abdominal: Soft. Bowel sounds are normal. There is no tenderness.  Musculoskeletal: Normal range of  motion. Exhibits no edema.  Lymphadenopathy:  Has no cervical adenopathy.  Neurological: Pt is alert and oriented to person, place, and time. Pt has normal reflexes. No cranial nerve deficit.  Skin: Skin is warm and dry. No rash noted.  Psychiatric:  1+ nervous. Behavior is normal.     Assessment & Plan:

## 2011-09-16 ENCOUNTER — Encounter (INDEPENDENT_AMBULATORY_CARE_PROVIDER_SITE_OTHER): Payer: Self-pay | Admitting: Surgery

## 2011-09-16 ENCOUNTER — Ambulatory Visit (INDEPENDENT_AMBULATORY_CARE_PROVIDER_SITE_OTHER): Payer: Medicare Other | Admitting: Surgery

## 2011-09-16 VITALS — BP 116/62 | HR 100 | Temp 97.6°F | Resp 20 | Ht 63.75 in | Wt 145.4 lb

## 2011-09-16 DIAGNOSIS — C50911 Malignant neoplasm of unspecified site of right female breast: Secondary | ICD-10-CM

## 2011-09-16 DIAGNOSIS — C50919 Malignant neoplasm of unspecified site of unspecified female breast: Secondary | ICD-10-CM

## 2011-09-16 NOTE — Progress Notes (Signed)
ASSESSMENT AND PLAN: 1.  Right breast cancer.  12 o'clock.  T2, N1  First seen 11/28/2010.  2.5 cm.  Lobular.  1/1 node positive.  ER - 85%, PR - 57%, Ki67 - 20%, Her2Neu - neg.  She sees Drs. Rubin/Manning and is taking [name].  Neoadjuvant for 8 months.   Right lumpectomy and right axillary SLNBx - 09/03/2011  To see Dr. [name] next week to talk about rad tx.  Has an appt in about one month to see Dr. Donnie Coffin.  Per the Z11 study, would not do further surgery at this time.  I will see her back in 6 months.   2.  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  IDC 2.5, 1/15 nodes positive  Treated with lumpectomy, axillary node dissection by Dr. Ivor Reining.  I discussed with the patient if she wanted Streck handling this cancer, but she is going to stay with me.  She last saw Streck/[name] in 01/22/2003.  3.  Anxiety. 4.  Hyperlipidemia. 5.  Had quit smoking.    But has restarted. 6.  COPD. 7.  Arthritis, on Celebrex.  The arthritis of her hands has acted up. 8.  Second left posterior tooth - implant - sees Dr. Cherlyn Cushing. 9.  Has a chronic cough she blames on post nasal drip (?cigarettes) 10.  Right shoulder pain - overuse injury  REFERRING PHYSICIAN: Oliver Barre, MD  HISTORY OF PRESENT ILLNESS: Stacey [name] is a 71 y.o. (DOB: 1940/03/01)  white female whose primary care physician is Oliver Barre, MD and comes to me today for folllow up of right breast lumpectomy.    She has done well with the incisions.  I reviewed her path report with her .  I also answered a lot of other questions - problem list, CXR report, CT scan report  Presented to Breast Ca Conf on 11/26/2010: 2 cm mass in right breast on [place], but on mammogram poorly seen because of density of breast tissue.  MRI shows 2.2 cm right breast mass Lobular features.    ER/PR pos, Ki67 - 20%, Her2Neu - neg  Past Medical History  Diagnosis Date  . ANEMIA-NOS 12/17/2006  . ANXIETY 05/03/2009  . CONSTIPATION 03/28/2010  . COPD 06/14/2008    . Cough 12/17/2006  . DIVERTICULOSIS, COLON 12/17/2006  . DYSPNEA 06/14/2008  . FATIGUE 06/14/2007  . GERD 03/28/2010  . HYPERLIPIDEMIA 06/14/2007  . IRRITABLE BOWEL SYNDROME, HX OF 12/17/2006  . 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 Dyspnea   . Cancer     bilateral  . Breast cancer 1997    s/p chemo/xrt    Current Outpatient Prescriptions  Medication Sig Dispense Refill  . aspirin 81 MG EC tablet Take 81 mg by mouth daily.        . calcium-vitamin D (OSCAL WITH D) 500-200 MG-UNIT per tablet Take 1 tablet by mouth daily.      . celecoxib (CELEBREX) 200 MG capsule Take 1 capsule (200 mg total) by mouth 2 (two) times daily as needed for pain.  60 capsule  11  . cholecalciferol (VITAMIN D) 1000 UNITS tablet Take 1,000 Units by mouth daily.        Marland Kitchen EPINEPHrine (EPIPEN) 0.3 mg/0.3 mL DEVI Use as directed       . fluticasone (FLONASE) 50 MCG/ACT nasal spray       . letrozole ([name]) 2.5 MG tablet Take 1 tablet (2.5 mg total) by mouth daily.  30 tablet  11  . Multiple Vitamin (  MULTIVITAMIN) capsule Take 1 capsule by mouth daily.        Marland Kitchen omeprazole (PRILOSEC) 20 MG capsule Take 1 capsule (20 mg total) by mouth daily.  30 capsule  11  . polyethylene glycol (MIRALAX / GLYCOLAX) packet Take 17 g by mouth daily.      . pseudoephedrine (SUDAFED) 30 MG tablet Take 30 mg by mouth daily.       Marland Kitchen zolpidem (AMBIEN) 10 MG tablet Take 5 mg by mouth at bedtime as needed. For sleep        Allergies  Allergen Reactions  . Clindamycin/Lincomycin Diarrhea and Nausea And Vomiting  . Codeine Nausea And Vomiting    REVIEW OF SYSTEMS:  Pulmonary:  Smoke cigarettes. Quit 1 week ago.  COPD.  But has started smoking again. Gastrointestinal:  No history of stomach disease.  No history of liver disease.  No history of gall bladder disease.  No history of pancreas disease.  No history of colon disease. Sees Dr. Kizzie Furnish.  Last colonoscopy 2004, for next one in 2014. Urologic:  No history of kidney stones.  No  history of bladder infections. GYN:  Sees Dr. Renato Shin. Musculoskeletal:  Arthritis.  Takes Celebrex for this.  Right bothering today.  Right shoulder has been a problem. Dental:  Her left teeth are better.  SOCIAL and FAMILY HISTORY: Mother of Blondell Reveal (?). [name] with her. I took care of her mother, Darnelle Spangle.  PHYSICAL EXAM: BP 116/62  Pulse 100  Temp 97.6 F (36.4 C) (Temporal)  Resp 20  Ht 5' 3.75" (1.619 m)  Wt 145 lb 6.4 oz (65.953 kg)  BMI 25.15 kg/m2  General: WN WF  who is alert and generally healthy appearing.  Breasts:   Right: Incision well healed.  DATA REVIEWED: Pathology report.  Ovidio Kin, M.D., [facility], [place] [phone]

## 2011-09-24 ENCOUNTER — Encounter: Payer: Self-pay | Admitting: Radiation Oncology

## 2011-09-24 ENCOUNTER — Ambulatory Visit
Admission: RE | Admit: 2011-09-24 | Discharge: 2011-09-24 | Disposition: A | Discharge status: Home / Self Care | Payer: Medicare Other | Source: Ambulatory Visit | Attending: Radiation Oncology | Admitting: Radiation Oncology

## 2011-09-24 ENCOUNTER — Encounter: Payer: Self-pay | Admitting: Internal Medicine

## 2011-09-24 ENCOUNTER — Other Ambulatory Visit (INDEPENDENT_AMBULATORY_CARE_PROVIDER_SITE_OTHER): Payer: Medicare Other

## 2011-09-24 VITALS — BP 141/83 | HR 89 | Temp 98.0°F | Resp 20 | Wt 148.4 lb

## 2011-09-24 DIAGNOSIS — J449 Chronic obstructive pulmonary disease, unspecified: Secondary | ICD-10-CM | POA: Diagnosis not present

## 2011-09-24 DIAGNOSIS — E785 Hyperlipidemia, unspecified: Secondary | ICD-10-CM | POA: Diagnosis not present

## 2011-09-24 DIAGNOSIS — Z79899 Other long term (current) drug therapy: Secondary | ICD-10-CM | POA: Insufficient documentation

## 2011-09-24 DIAGNOSIS — Z807 Family history of other malignant neoplasms of lymphoid, hematopoietic and related tissues: Secondary | ICD-10-CM | POA: Diagnosis not present

## 2011-09-24 DIAGNOSIS — Z800 Family history of malignant neoplasm of digestive organs: Secondary | ICD-10-CM | POA: Insufficient documentation

## 2011-09-24 DIAGNOSIS — C50419 Malignant neoplasm of upper-outer quadrant of unspecified female breast: Secondary | ICD-10-CM | POA: Diagnosis not present

## 2011-09-24 DIAGNOSIS — C773 Secondary and unspecified malignant neoplasm of axilla and upper limb lymph nodes: Secondary | ICD-10-CM | POA: Insufficient documentation

## 2011-09-24 DIAGNOSIS — Z510 Encounter for antineoplastic radiation therapy: Secondary | ICD-10-CM | POA: Diagnosis not present

## 2011-09-24 DIAGNOSIS — Z7982 Long term (current) use of aspirin: Secondary | ICD-10-CM | POA: Diagnosis not present

## 2011-09-24 DIAGNOSIS — J4489 Other specified chronic obstructive pulmonary disease: Secondary | ICD-10-CM | POA: Insufficient documentation

## 2011-09-24 DIAGNOSIS — F17200 Nicotine dependence, unspecified, uncomplicated: Secondary | ICD-10-CM | POA: Insufficient documentation

## 2011-09-24 DIAGNOSIS — Z9221 Personal history of antineoplastic chemotherapy: Secondary | ICD-10-CM | POA: Diagnosis not present

## 2011-09-24 DIAGNOSIS — R5381 Other malaise: Secondary | ICD-10-CM | POA: Diagnosis not present

## 2011-09-24 DIAGNOSIS — C50919 Malignant neoplasm of unspecified site of unspecified female breast: Secondary | ICD-10-CM | POA: Diagnosis not present

## 2011-09-24 DIAGNOSIS — C50911 Malignant neoplasm of unspecified site of right female breast: Secondary | ICD-10-CM

## 2011-09-24 DIAGNOSIS — Z803 Family history of malignant neoplasm of breast: Secondary | ICD-10-CM | POA: Diagnosis not present

## 2011-09-24 DIAGNOSIS — R5383 Other fatigue: Secondary | ICD-10-CM

## 2011-09-24 LAB — URINALYSIS, ROUTINE W REFLEX MICROSCOPIC
Ketones, ur: NEGATIVE
Leukocytes, UA: NEGATIVE
Specific Gravity, Urine: 1.01 (ref 1.000–1.030)
Urine Glucose: NEGATIVE
Urobilinogen, UA: 0.2 (ref 0.0–1.0)
pH: 7 (ref 5.0–8.0)

## 2011-09-24 LAB — LIPID PANEL
Cholesterol: 227 mg/dL — ABNORMAL HIGH (ref 0–200)
HDL: 68 mg/dL (ref >39.00)
Total CHOL/HDL Ratio: 3
Triglycerides: 324 mg/dL — ABNORMAL HIGH (ref 0.0–149.0)

## 2011-09-24 LAB — LDL CHOLESTEROL, DIRECT: Direct LDL: 123.4 mg/dL

## 2011-09-24 NOTE — Progress Notes (Signed)
Please see the Nurse Progress Note in the MD Initial Consult Encounter for this patient. 

## 2011-09-24 NOTE — Addendum Note (Signed)
Encounter addended by: Tessa Lerner, RN on: 09/24/2011  4:58 PM<BR>     Documentation filed: Charges VN

## 2011-09-24 NOTE — Progress Notes (Signed)
Patient here for radiation consultation of right breast cancer .ERPR negative.Continues to take femara .

## 2011-09-24 NOTE — Progress Notes (Signed)
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 female who was evaluated in October for a new right-sided lobular breast cancer. I met with her in November to discuss potential treatment options. She proceeded with 8 months of [name] therapy. Clinically, or tumor shrank on physical exam ultrasound and MRI. She underwent lumpectomy with sentinel lymph node dissection on August 1. This demonstrated 2.5 cm residual invasive lobular carcinoma with negative surgical margins, greater than 1 cm. One sentinel lymph node was sampled and it was positive for metastatic involvement. The patient is recovering uneventfully from surgery and is currently been referred today for discussion of potential radiation treatment. We discussed radiation in some detail back in November and she returns today to coordinate therapy.  PREVIOUS RADIATION THERAPY: Yes the patient received left breast radiotherapy in 1997  PAST MEDICAL HISTORY:  has a past medical history of ANEMIA-NOS (12/17/2006); ANXIETY (05/03/2009); CONSTIPATION (03/28/2010); COPD; Cough (12/17/2006); DIVERTICULOSIS, COLON (12/17/2006); FATIGUE (06/14/2007); GERD (03/28/2010); HYPERLIPIDEMIA (06/14/2007); IRRITABLE BOWEL SYNDROME, HX OF (12/17/2006); OSTEOARTHRITIS, HAND (06/26/2009); Cancer; and Breast cancer (1997).    PAST SURGICAL HISTORY: Past Surgical History  Procedure Date  . Appendectomy   . Tonsillectomy   . Rectal fissure repair   . Abdominal hysterectomy   . Inguinal herniorrhapy 1984    left  . Tmj arthroplasty 1989  . S/p lumpectomy 1997    melignant left x 2  .  S/p benign breast biopsy 2003    right  . S/p left foot surgury 2009  . Spiral fx left foot 2008    no surgury  . Breast surgery     lumpectomy  . Breast biopsy 11/14/10     r breast: inv, insitu mammary carcinoma w/calcif, er/pr +, her2 -  . Hernia repair     FAMILY HISTORY: family history includes Alcohol abuse in her mother; Breast cancer in her cousin; Cancer in her brother and mother; Colon polyps in her mother; Diabetes in her mother; Hypertension in her mother; Lymphoma in her brothers; Pancreatic cancer in her mother; and Stroke in her mother.  SOCIAL HISTORY:  reports that she has been smoking Cigarettes.  She has a 10 pack-year smoking history. She does not have any smokeless tobacco history on file. She reports that she drinks alcohol. She reports that she does not use illicit drugs.  ALLERGIES: Clindamycin/lincomycin and Codeine  MEDICATIONS:  Current Outpatient Prescriptions  Medication Sig Dispense Refill  . aspirin 81 MG EC tablet Take 81 mg by mouth daily.        . calcium-vitamin D (OSCAL WITH D) 500-200 MG-UNIT per tablet Take 1 tablet by mouth daily.      . celecoxib (CELEBREX) 200 MG capsule Take 1 capsule (200 mg total) by mouth 2 (two) times daily as needed for pain.  60 capsule  11  . cholecalciferol (VITAMIN D) 1000 UNITS tablet Take 1,000 Units by mouth daily.        Marland Kitchen EPINEPHrine (EPIPEN) 0.3 mg/0.3 mL DEVI Use as directed       . fluticasone (FLONASE) 50 MCG/ACT nasal spray       .  letrozole ([name]) 2.5 MG tablet Take 1 tablet (2.5 mg total) by mouth daily.  30 tablet  11  . Multiple Vitamin (MULTIVITAMIN) capsule Take 1 capsule by mouth daily.        Marland Kitchen omeprazole (PRILOSEC) 20 MG capsule Take 1 capsule (20 mg total) by mouth daily.  30 capsule  11  . polyethylene glycol (MIRALAX / GLYCOLAX) packet Take 17 g by mouth daily.      . pseudoephedrine (SUDAFED) 30 MG tablet Take 30 mg by mouth daily.       Marland Kitchen zolpidem (AMBIEN) 10 MG tablet Take 5 mg by mouth at  bedtime as needed. For sleep        REVIEW OF SYSTEMS:  A 15 point review of systems is documented in the electronic medical record. This was obtained by the nursing staff. However, I reviewed this with the patient to discuss relevant findings and make appropriate changes.  A comprehensive review of systems was negative.   PHYSICAL EXAM:  weight is 148 lb 6.4 oz (67.314 kg). Her temperature is 98 F (36.7 C). Her blood pressure is 141/83 and her pulse is 89. Her respiration is 20.   The patient is in no acute distress today. Is alert and oriented. Her neck and supraclavicular region are free of adenopathy lungs are clear heart is regular the breasts are symmetric on examination. The recent lumpectomy site and axillary incision are well-healed with no evidence of infection. The postsurgical cosmetic result appears good. Abdomen is soft extremities are free of cyanosis clubbing or edema neurologically patient is grossly intact.  LABORATORY DATA:  Lab Results  Component Value Date   WBC 9.3 08/28/2011   HGB 13.1 08/28/2011   HCT 38.0 08/28/2011   MCV 88.2 08/28/2011   PLT 283 08/28/2011   Lab Results  Component Value Date   NA 138 08/28/2011   K 3.9 08/28/2011   CL 99 08/28/2011   CO2 30 08/28/2011   Lab Results  Component Value Date   ALT 18 12/10/2010   AST 30 03/06/2011   ALKPHOS 47 03/06/2011   BILITOT 0.40 03/06/2011     RADIOGRAPHY: Dg Chest 2 View  08/28/2011  *RADIOLOGY REPORT*  Clinical Data: Attention.  Preoperative evaluation.  History of breast cancer with left breast lumpectomy  CHEST - 2 VIEW  Comparison: 06/14/2008  Findings: Heart and mediastinal contours are within normal limits. Coarse interstitial markings are identified and are stable compatible with underlying chronic change.  No new focal infiltrates or signs of congestive failure seen.  No pleural fluid or significant peribronchial cuffing is identified.  Surgical clips are identified overlying the left breast in the left  axillary region.  Stable bilateral apical pleural thickening is noted. Stable punctate left breast calcifications project over the lower left lung field.  Bony structures appear intact  IMPRESSION: Stable cardiopulmonary appearance with no new focal or acute abnormality seen  Original Report Authenticated By: Bertha Stakes, M.D.   Nm Sentinel Node Inj-no Rpt (breast)  09/03/2011  CLINICAL DATA: breast cancer   Sulfur colloid was injected intradermally by the nuclear medicine  technologist for breast cancer sentinel node localization.        IMPRESSION: The patient is a very nice 71 year old woman with node positive invasive lobular carcinoma. She has completed surgery. She did have one positive sentinel lymph node. Based on the results from Z. 11, the patient is felt to be eligible to proceed with adjuvant radiotherapy to the breast at this time.  I did discuss her case with Dr. Donnie Coffin and he would not recommend systemic chemotherapy at this point. He will meet with her in followup on September 30.  PLAN: Today, talked patient about the recent findings related to surgery and we again discussed the delivery of radiation treatment to the right breast and regional lymph nodes. Talked about the logistics and deliver treatment as well as anticipated acute and late sequelae. We spent more than 50% of our one hour visit today inpatient counseling and coordination of care.  The patient will return on December 6 to proceed with CT based treatment planning for radiotherapy to the right breast and regional nodes. I would anticipate delivering between 30 and 33 treatments. The patient will see Dr. Donnie Coffin September 30 to discuss ongoing antiestrogen therapy.   ------------------------------------------------  Artist Pais. [name], M.D.

## 2011-09-25 NOTE — Addendum Note (Signed)
Encounter addended by: Delynn Flavin, RN on: 09/25/2011  6:55 PM<BR>     Documentation filed: Charges VN

## 2011-10-01 ENCOUNTER — Other Ambulatory Visit: Payer: Self-pay | Admitting: Internal Medicine

## 2011-10-01 NOTE — Telephone Encounter (Signed)
Done hardcopy to robin  

## 2011-10-01 NOTE — Telephone Encounter (Signed)
Faxed hardcopy to pharmacy. 

## 2011-10-09 ENCOUNTER — Ambulatory Visit
Admission: RE | Admit: 2011-10-09 | Discharge: 2011-10-09 | Disposition: A | Discharge status: Home / Self Care | Payer: Medicare Other | Source: Ambulatory Visit | Attending: Radiation Oncology | Admitting: Radiation Oncology

## 2011-10-09 DIAGNOSIS — C773 Secondary and unspecified malignant neoplasm of axilla and upper limb lymph nodes: Secondary | ICD-10-CM | POA: Diagnosis not present

## 2011-10-09 DIAGNOSIS — E785 Hyperlipidemia, unspecified: Secondary | ICD-10-CM | POA: Diagnosis not present

## 2011-10-09 DIAGNOSIS — C50919 Malignant neoplasm of unspecified site of unspecified female breast: Secondary | ICD-10-CM | POA: Diagnosis not present

## 2011-10-09 DIAGNOSIS — Z510 Encounter for antineoplastic radiation therapy: Secondary | ICD-10-CM | POA: Diagnosis not present

## 2011-10-09 DIAGNOSIS — J449 Chronic obstructive pulmonary disease, unspecified: Secondary | ICD-10-CM | POA: Diagnosis not present

## 2011-10-09 DIAGNOSIS — C50419 Malignant neoplasm of upper-outer quadrant of unspecified female breast: Secondary | ICD-10-CM | POA: Diagnosis not present

## 2011-10-09 DIAGNOSIS — Z9221 Personal history of antineoplastic chemotherapy: Secondary | ICD-10-CM | POA: Diagnosis not present

## 2011-10-09 NOTE — Progress Notes (Signed)
  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  Identity was confirmed.  All relevant records and images related to the planned course of therapy were reviewed.  The patient freely provided informed written consent to proceed with treatment after reviewing the details related to the planned course of therapy. The consent form was witnessed and verified by the simulation staff.   IMMOBILIZATION:  The following immobilization was used:Custom Moldable Pillow, breast board.   A wire was placed on the scar.  CT images were obtained.  An isocenter was placed. Skin markings were placed.  The CT images were loaded into the planning software.  Then the target and avoidance structures were contoured.  Treatment planning then occurred.  The radiation prescription was entered and confirmed.  A total of 5 complex treatment devices were fabricated including a neck positioning pillow and four MLCs to shape radiation around the breast and nodes. I have requested : Isodose Plan.   PLAN:  The patient will receive 50.4 Gy in 28 fractions then boost the lumpectomy site to 60.4 Gy.  ________________________________  Artist Pais. [name], M.D.

## 2011-10-12 ENCOUNTER — Telehealth (INDEPENDENT_AMBULATORY_CARE_PROVIDER_SITE_OTHER): Payer: Self-pay | Admitting: Surgery

## 2011-10-15 ENCOUNTER — Other Ambulatory Visit: Payer: Self-pay | Admitting: Internal Medicine

## 2011-10-16 ENCOUNTER — Ambulatory Visit
Admission: RE | Admit: 2011-10-16 | Discharge: 2011-10-16 | Disposition: A | Discharge status: Home / Self Care | Payer: Medicare Other | Source: Ambulatory Visit | Attending: Radiation Oncology | Admitting: Radiation Oncology

## 2011-10-16 DIAGNOSIS — C50919 Malignant neoplasm of unspecified site of unspecified female breast: Secondary | ICD-10-CM | POA: Diagnosis not present

## 2011-10-16 DIAGNOSIS — Z9221 Personal history of antineoplastic chemotherapy: Secondary | ICD-10-CM | POA: Diagnosis not present

## 2011-10-16 DIAGNOSIS — C773 Secondary and unspecified malignant neoplasm of axilla and upper limb lymph nodes: Secondary | ICD-10-CM | POA: Diagnosis not present

## 2011-10-16 DIAGNOSIS — E785 Hyperlipidemia, unspecified: Secondary | ICD-10-CM | POA: Diagnosis not present

## 2011-10-16 DIAGNOSIS — C50911 Malignant neoplasm of unspecified site of right female breast: Secondary | ICD-10-CM

## 2011-10-16 DIAGNOSIS — C50419 Malignant neoplasm of upper-outer quadrant of unspecified female breast: Secondary | ICD-10-CM | POA: Diagnosis not present

## 2011-10-16 DIAGNOSIS — Z510 Encounter for antineoplastic radiation therapy: Secondary | ICD-10-CM | POA: Diagnosis not present

## 2011-10-16 DIAGNOSIS — J449 Chronic obstructive pulmonary disease, unspecified: Secondary | ICD-10-CM | POA: Diagnosis not present

## 2011-10-18 ENCOUNTER — Encounter: Payer: Self-pay | Admitting: Radiation Oncology

## 2011-10-18 NOTE — Progress Notes (Signed)
  Radiation Oncology         [phone] ________________________________  Name: Stacey [name] MRN: [id number]  Date: 10/16/2011  DOB: 1940/08/20  Simulation Verification Note  Status: outpatient  NARRATIVE: The patient was brought to the treatment unit and placed in the planned treatment position. The clinical setup was verified. Then port films were obtained and uploaded to the radiation oncology medical record software.  The treatment beams were carefully compared against the planned radiation fields. The position location and shape of the radiation fields was reviewed. They targeted volume of tissue appears to be appropriately covered by the radiation beams. Organs at risk appear to be excluded as planned.  Based on my personal review, I approved the simulation verification. The patient's treatment will proceed as planned.  ------------------------------------------------  Artist Pais [name], M.D.

## 2011-10-19 ENCOUNTER — Ambulatory Visit
Admission: RE | Admit: 2011-10-19 | Discharge: 2011-10-19 | Disposition: A | Discharge status: Home / Self Care | Payer: Medicare Other | Source: Ambulatory Visit | Attending: Radiation Oncology | Admitting: Radiation Oncology

## 2011-10-19 DIAGNOSIS — Z9221 Personal history of antineoplastic chemotherapy: Secondary | ICD-10-CM | POA: Diagnosis not present

## 2011-10-19 DIAGNOSIS — C50419 Malignant neoplasm of upper-outer quadrant of unspecified female breast: Secondary | ICD-10-CM | POA: Diagnosis not present

## 2011-10-19 DIAGNOSIS — C773 Secondary and unspecified malignant neoplasm of axilla and upper limb lymph nodes: Secondary | ICD-10-CM | POA: Diagnosis not present

## 2011-10-19 DIAGNOSIS — J449 Chronic obstructive pulmonary disease, unspecified: Secondary | ICD-10-CM | POA: Diagnosis not present

## 2011-10-19 DIAGNOSIS — Z510 Encounter for antineoplastic radiation therapy: Secondary | ICD-10-CM | POA: Diagnosis not present

## 2011-10-19 DIAGNOSIS — E785 Hyperlipidemia, unspecified: Secondary | ICD-10-CM | POA: Diagnosis not present

## 2011-10-19 DIAGNOSIS — C50919 Malignant neoplasm of unspecified site of unspecified female breast: Secondary | ICD-10-CM | POA: Diagnosis not present

## 2011-10-20 ENCOUNTER — Ambulatory Visit: Payer: Medicare Other

## 2011-10-21 ENCOUNTER — Ambulatory Visit
Admission: RE | Admit: 2011-10-21 | Discharge: 2011-10-21 | Disposition: A | Discharge status: Home / Self Care | Payer: Medicare Other | Source: Ambulatory Visit | Attending: Radiation Oncology | Admitting: Radiation Oncology

## 2011-10-21 VITALS — BP 138/71 | Wt 148.4 lb

## 2011-10-21 DIAGNOSIS — C50919 Malignant neoplasm of unspecified site of unspecified female breast: Secondary | ICD-10-CM | POA: Diagnosis not present

## 2011-10-21 DIAGNOSIS — J449 Chronic obstructive pulmonary disease, unspecified: Secondary | ICD-10-CM | POA: Diagnosis not present

## 2011-10-21 DIAGNOSIS — Z510 Encounter for antineoplastic radiation therapy: Secondary | ICD-10-CM | POA: Diagnosis not present

## 2011-10-21 DIAGNOSIS — E785 Hyperlipidemia, unspecified: Secondary | ICD-10-CM | POA: Diagnosis not present

## 2011-10-21 DIAGNOSIS — C773 Secondary and unspecified malignant neoplasm of axilla and upper limb lymph nodes: Secondary | ICD-10-CM | POA: Diagnosis not present

## 2011-10-21 DIAGNOSIS — C50911 Malignant neoplasm of unspecified site of right female breast: Secondary | ICD-10-CM

## 2011-10-21 DIAGNOSIS — Z9221 Personal history of antineoplastic chemotherapy: Secondary | ICD-10-CM | POA: Diagnosis not present

## 2011-10-21 MED ORDER — ALRA NON-METALLIC DEODORANT (RAD-ONC)
1.0000 "application " | Freq: Once | TOPICAL | Status: AC
  Administered 2011-10-21: 1 via TOPICAL

## 2011-10-21 MED ORDER — RADIAPLEXRX EX GEL
Freq: Once | CUTANEOUS | Status: AC
  Administered 2011-10-21: 15:00:00 via TOPICAL

## 2011-10-21 NOTE — Progress Notes (Signed)
Post sim ed completed w/pt; gave pt "Radiation and You" booklet w/all pertinent information marked and discussed, re: fatigue, skin care, pain, nutrition. All questions answered.

## 2011-10-22 ENCOUNTER — Encounter: Payer: Self-pay | Admitting: Radiation Oncology

## 2011-10-22 ENCOUNTER — Ambulatory Visit
Admission: RE | Admit: 2011-10-22 | Discharge: 2011-10-22 | Disposition: A | Discharge status: Home / Self Care | Payer: Medicare Other | Source: Ambulatory Visit | Attending: Radiation Oncology | Admitting: Radiation Oncology

## 2011-10-22 DIAGNOSIS — Z9221 Personal history of antineoplastic chemotherapy: Secondary | ICD-10-CM | POA: Diagnosis not present

## 2011-10-22 DIAGNOSIS — Z510 Encounter for antineoplastic radiation therapy: Secondary | ICD-10-CM | POA: Diagnosis not present

## 2011-10-22 DIAGNOSIS — C773 Secondary and unspecified malignant neoplasm of axilla and upper limb lymph nodes: Secondary | ICD-10-CM | POA: Diagnosis not present

## 2011-10-22 DIAGNOSIS — C50919 Malignant neoplasm of unspecified site of unspecified female breast: Secondary | ICD-10-CM | POA: Diagnosis not present

## 2011-10-22 DIAGNOSIS — J449 Chronic obstructive pulmonary disease, unspecified: Secondary | ICD-10-CM | POA: Diagnosis not present

## 2011-10-22 DIAGNOSIS — E785 Hyperlipidemia, unspecified: Secondary | ICD-10-CM | POA: Diagnosis not present

## 2011-10-23 ENCOUNTER — Ambulatory Visit
Admission: RE | Admit: 2011-10-23 | Discharge: 2011-10-23 | Disposition: A | Discharge status: Home / Self Care | Payer: Medicare Other | Source: Ambulatory Visit | Attending: Radiation Oncology | Admitting: Radiation Oncology

## 2011-10-23 ENCOUNTER — Encounter: Payer: Self-pay | Admitting: Radiation Oncology

## 2011-10-23 VITALS — BP 119/74 | HR 83 | Temp 98.0°F | Wt 149.7 lb

## 2011-10-23 DIAGNOSIS — C773 Secondary and unspecified malignant neoplasm of axilla and upper limb lymph nodes: Secondary | ICD-10-CM | POA: Diagnosis not present

## 2011-10-23 DIAGNOSIS — E785 Hyperlipidemia, unspecified: Secondary | ICD-10-CM | POA: Diagnosis not present

## 2011-10-23 DIAGNOSIS — C50919 Malignant neoplasm of unspecified site of unspecified female breast: Secondary | ICD-10-CM | POA: Diagnosis not present

## 2011-10-23 DIAGNOSIS — Z510 Encounter for antineoplastic radiation therapy: Secondary | ICD-10-CM | POA: Diagnosis not present

## 2011-10-23 DIAGNOSIS — J449 Chronic obstructive pulmonary disease, unspecified: Secondary | ICD-10-CM | POA: Diagnosis not present

## 2011-10-23 DIAGNOSIS — Z9221 Personal history of antineoplastic chemotherapy: Secondary | ICD-10-CM | POA: Diagnosis not present

## 2011-10-23 DIAGNOSIS — C50911 Malignant neoplasm of unspecified site of right female breast: Secondary | ICD-10-CM

## 2011-10-23 NOTE — Progress Notes (Signed)
Ms. Ferrero has received 4/28 Fractions to the whole breast.  No complaints of pain nor any changes in skin.

## 2011-10-23 NOTE — Progress Notes (Signed)
  Radiation Oncology         [phone] ________________________________  Name: Stacey [name] MRN: [id number]  Date: 10/23/2011  DOB: [birth year]  Weekly Radiation Therapy Management  Current Dose: 7.2 Gy     Planned Dose:  60.4 Gy  Narrative . . . . . . . . The patient presents for routine under treatment assessment.                                                      The patient is without complaint.                                 Set-up films were reviewed.                                 The chart was checked. Physical Findings. . . Weight essentially stable.  No significant changes. Impression . . . . . . . The patient is  tolerating radiation. Plan . . . . . . . . . . . . Continue treatment as planned.  ________________________________  Artist Pais. [name], M.D.

## 2011-10-23 NOTE — Assessment & Plan Note (Signed)
4/33 radiation treatments

## 2011-10-26 ENCOUNTER — Ambulatory Visit
Admission: RE | Admit: 2011-10-26 | Discharge: 2011-10-26 | Disposition: A | Discharge status: Home / Self Care | Payer: Medicare Other | Source: Ambulatory Visit | Attending: Radiation Oncology | Admitting: Radiation Oncology

## 2011-10-26 DIAGNOSIS — Z510 Encounter for antineoplastic radiation therapy: Secondary | ICD-10-CM | POA: Diagnosis not present

## 2011-10-26 DIAGNOSIS — C50919 Malignant neoplasm of unspecified site of unspecified female breast: Secondary | ICD-10-CM | POA: Diagnosis not present

## 2011-10-26 DIAGNOSIS — Z9221 Personal history of antineoplastic chemotherapy: Secondary | ICD-10-CM | POA: Diagnosis not present

## 2011-10-26 DIAGNOSIS — H40019 Open angle with borderline findings, low risk, unspecified eye: Secondary | ICD-10-CM | POA: Diagnosis not present

## 2011-10-26 DIAGNOSIS — C773 Secondary and unspecified malignant neoplasm of axilla and upper limb lymph nodes: Secondary | ICD-10-CM | POA: Diagnosis not present

## 2011-10-26 DIAGNOSIS — E785 Hyperlipidemia, unspecified: Secondary | ICD-10-CM | POA: Diagnosis not present

## 2011-10-26 DIAGNOSIS — H04129 Dry eye syndrome of unspecified lacrimal gland: Secondary | ICD-10-CM | POA: Diagnosis not present

## 2011-10-26 DIAGNOSIS — J449 Chronic obstructive pulmonary disease, unspecified: Secondary | ICD-10-CM | POA: Diagnosis not present

## 2011-10-27 ENCOUNTER — Ambulatory Visit
Admission: RE | Admit: 2011-10-27 | Discharge: 2011-10-27 | Disposition: A | Discharge status: Home / Self Care | Payer: Medicare Other | Source: Ambulatory Visit | Attending: Radiation Oncology | Admitting: Radiation Oncology

## 2011-10-27 DIAGNOSIS — J449 Chronic obstructive pulmonary disease, unspecified: Secondary | ICD-10-CM | POA: Diagnosis not present

## 2011-10-27 DIAGNOSIS — E785 Hyperlipidemia, unspecified: Secondary | ICD-10-CM | POA: Diagnosis not present

## 2011-10-27 DIAGNOSIS — C50919 Malignant neoplasm of unspecified site of unspecified female breast: Secondary | ICD-10-CM | POA: Diagnosis not present

## 2011-10-27 DIAGNOSIS — C773 Secondary and unspecified malignant neoplasm of axilla and upper limb lymph nodes: Secondary | ICD-10-CM | POA: Diagnosis not present

## 2011-10-27 DIAGNOSIS — C50419 Malignant neoplasm of upper-outer quadrant of unspecified female breast: Secondary | ICD-10-CM | POA: Diagnosis not present

## 2011-10-27 DIAGNOSIS — Z510 Encounter for antineoplastic radiation therapy: Secondary | ICD-10-CM | POA: Diagnosis not present

## 2011-10-27 DIAGNOSIS — Z9221 Personal history of antineoplastic chemotherapy: Secondary | ICD-10-CM | POA: Diagnosis not present

## 2011-10-28 ENCOUNTER — Ambulatory Visit
Admission: RE | Admit: 2011-10-28 | Discharge: 2011-10-28 | Disposition: A | Discharge status: Home / Self Care | Payer: Medicare Other | Source: Ambulatory Visit | Attending: Radiation Oncology | Admitting: Radiation Oncology

## 2011-10-28 DIAGNOSIS — C773 Secondary and unspecified malignant neoplasm of axilla and upper limb lymph nodes: Secondary | ICD-10-CM | POA: Diagnosis not present

## 2011-10-28 DIAGNOSIS — Z9221 Personal history of antineoplastic chemotherapy: Secondary | ICD-10-CM | POA: Diagnosis not present

## 2011-10-28 DIAGNOSIS — J449 Chronic obstructive pulmonary disease, unspecified: Secondary | ICD-10-CM | POA: Diagnosis not present

## 2011-10-28 DIAGNOSIS — Z510 Encounter for antineoplastic radiation therapy: Secondary | ICD-10-CM | POA: Diagnosis not present

## 2011-10-28 DIAGNOSIS — C50919 Malignant neoplasm of unspecified site of unspecified female breast: Secondary | ICD-10-CM | POA: Diagnosis not present

## 2011-10-28 DIAGNOSIS — E785 Hyperlipidemia, unspecified: Secondary | ICD-10-CM | POA: Diagnosis not present

## 2011-10-29 ENCOUNTER — Ambulatory Visit
Admission: RE | Admit: 2011-10-29 | Discharge: 2011-10-29 | Disposition: A | Discharge status: Home / Self Care | Payer: Medicare Other | Source: Ambulatory Visit | Attending: Radiation Oncology | Admitting: Radiation Oncology

## 2011-10-29 ENCOUNTER — Ambulatory Visit: Payer: Medicare Other | Admitting: Oncology

## 2011-10-29 DIAGNOSIS — J449 Chronic obstructive pulmonary disease, unspecified: Secondary | ICD-10-CM | POA: Diagnosis not present

## 2011-10-29 DIAGNOSIS — Z9221 Personal history of antineoplastic chemotherapy: Secondary | ICD-10-CM | POA: Diagnosis not present

## 2011-10-29 DIAGNOSIS — E785 Hyperlipidemia, unspecified: Secondary | ICD-10-CM | POA: Diagnosis not present

## 2011-10-29 DIAGNOSIS — Z510 Encounter for antineoplastic radiation therapy: Secondary | ICD-10-CM | POA: Diagnosis not present

## 2011-10-29 DIAGNOSIS — C773 Secondary and unspecified malignant neoplasm of axilla and upper limb lymph nodes: Secondary | ICD-10-CM | POA: Diagnosis not present

## 2011-10-29 DIAGNOSIS — C50919 Malignant neoplasm of unspecified site of unspecified female breast: Secondary | ICD-10-CM | POA: Diagnosis not present

## 2011-10-30 ENCOUNTER — Encounter: Payer: Self-pay | Admitting: Radiation Oncology

## 2011-10-30 ENCOUNTER — Ambulatory Visit
Admission: RE | Admit: 2011-10-30 | Discharge: 2011-10-30 | Disposition: A | Discharge status: Home / Self Care | Payer: Medicare Other | Source: Ambulatory Visit | Attending: Radiation Oncology | Admitting: Radiation Oncology

## 2011-10-30 VITALS — BP 131/72 | HR 85 | Temp 97.9°F | Resp 20 | Wt 146.2 lb

## 2011-10-30 DIAGNOSIS — E785 Hyperlipidemia, unspecified: Secondary | ICD-10-CM | POA: Diagnosis not present

## 2011-10-30 DIAGNOSIS — C50911 Malignant neoplasm of unspecified site of right female breast: Secondary | ICD-10-CM

## 2011-10-30 DIAGNOSIS — Z510 Encounter for antineoplastic radiation therapy: Secondary | ICD-10-CM | POA: Diagnosis not present

## 2011-10-30 DIAGNOSIS — C50919 Malignant neoplasm of unspecified site of unspecified female breast: Secondary | ICD-10-CM | POA: Diagnosis not present

## 2011-10-30 DIAGNOSIS — Z9221 Personal history of antineoplastic chemotherapy: Secondary | ICD-10-CM | POA: Diagnosis not present

## 2011-10-30 DIAGNOSIS — C773 Secondary and unspecified malignant neoplasm of axilla and upper limb lymph nodes: Secondary | ICD-10-CM | POA: Diagnosis not present

## 2011-10-30 DIAGNOSIS — J449 Chronic obstructive pulmonary disease, unspecified: Secondary | ICD-10-CM | POA: Diagnosis not present

## 2011-10-30 NOTE — Progress Notes (Signed)
Patient here weekly rad txs right breast, compleetd 9/33 , slight erythema on breast area, skin intact,alert,oriented x3, ocsasional pain in right breast, patient still gets nerve pain in left breast  Prior tx stated, eating and drinking well 2:34 PM

## 2011-10-30 NOTE — Progress Notes (Signed)
  Radiation Oncology         [phone] ________________________________  Name: Stacey [name] MRN: [id number]  Date: 10/30/2011  DOB: 05-31-1940  Weekly Radiation Therapy Management  Current Dose: 16.2 Gy     Planned Dose:  60.4 Gy  Narrative . . . . . . . . The patient presents for routine under treatment assessment.                                                     The patient is without complaint.                                 Set-up films were reviewed.                                 The chart was checked. Physical Findings. . . Weight essentially stable.  No significant changes.  Breast erythema Impression . . . . . . . The patient is  tolerating radiation. Plan . . . . . . . . . . . . Continue treatment as planned.  ________________________________  Artist Pais. [name], M.D.

## 2011-11-02 ENCOUNTER — Telehealth: Payer: Self-pay | Admitting: *Deleted

## 2011-11-02 ENCOUNTER — Ambulatory Visit: Payer: Medicare Other | Admitting: Oncology

## 2011-11-02 ENCOUNTER — Ambulatory Visit (HOSPITAL_BASED_OUTPATIENT_CLINIC_OR_DEPARTMENT_OTHER): Payer: Medicare Other | Admitting: Oncology

## 2011-11-02 ENCOUNTER — Ambulatory Visit
Admission: RE | Admit: 2011-11-02 | Discharge: 2011-11-02 | Disposition: A | Discharge status: Home / Self Care | Payer: Medicare Other | Source: Ambulatory Visit | Attending: Radiation Oncology | Admitting: Radiation Oncology

## 2011-11-02 ENCOUNTER — Telehealth: Payer: Self-pay | Admitting: Oncology

## 2011-11-02 VITALS — BP 135/74 | HR 85 | Temp 98.8°F | Resp 20 | Ht 63.5 in | Wt 147.0 lb

## 2011-11-02 DIAGNOSIS — Z170 Estrogen receptor positive status [ER+]: Secondary | ICD-10-CM

## 2011-11-02 DIAGNOSIS — C50219 Malignant neoplasm of upper-inner quadrant of unspecified female breast: Secondary | ICD-10-CM | POA: Diagnosis not present

## 2011-11-02 DIAGNOSIS — C50919 Malignant neoplasm of unspecified site of unspecified female breast: Secondary | ICD-10-CM | POA: Diagnosis not present

## 2011-11-02 DIAGNOSIS — E559 Vitamin D deficiency, unspecified: Secondary | ICD-10-CM

## 2011-11-02 DIAGNOSIS — J449 Chronic obstructive pulmonary disease, unspecified: Secondary | ICD-10-CM | POA: Diagnosis not present

## 2011-11-02 DIAGNOSIS — Z510 Encounter for antineoplastic radiation therapy: Secondary | ICD-10-CM | POA: Diagnosis not present

## 2011-11-02 DIAGNOSIS — Z9221 Personal history of antineoplastic chemotherapy: Secondary | ICD-10-CM | POA: Diagnosis not present

## 2011-11-02 DIAGNOSIS — E785 Hyperlipidemia, unspecified: Secondary | ICD-10-CM | POA: Diagnosis not present

## 2011-11-02 DIAGNOSIS — C773 Secondary and unspecified malignant neoplasm of axilla and upper limb lymph nodes: Secondary | ICD-10-CM | POA: Diagnosis not present

## 2011-11-02 DIAGNOSIS — C50911 Malignant neoplasm of unspecified site of right female breast: Secondary | ICD-10-CM

## 2011-11-02 NOTE — Progress Notes (Signed)
Hematology and Oncology Follow Up Visit  Stacey [name] [id number] [birth year] 71 y.o. 11/02/2011 1:19 PM PCP  Principle Diagnosis: 71 yo woman with hx of locally advanced lobular cancer currently on femara starting in 11/12.  Interim History:  Since being seen last she she had surgery on 09/02/2001. Final pathology showed a 2.5 cm residual lobular cancer with one out of one involved lymph node. The tumor was ER/PR positive HER-2 is negative. She is currently under radiation therapy and tolerating it well. She continues on [name].  Medications: I have reviewed the patient's current medications.  Allergies:  Allergies  Allergen Reactions  . Clindamycin/Lincomycin Diarrhea and Nausea And Vomiting  . Codeine Nausea And Vomiting    Past Medical History, Surgical history, Social history, and Family History were reviewed and updated.  Review of Systems: Constitutional:  Negative for fever, chills, night sweats, anorexia, weight loss, pain. Cardiovascular: no chest pain or dyspnea on exertion Respiratory: no cough, shortness of breath, or wheezing Neurological: no TIA or stroke symptoms Dermatological: negative ENT: negative Skin Gastrointestinal: no abdominal pain, change in bowel habits, or black or bloody stools Genito-Urinary: no dysuria, trouble voiding, or hematuria Hematological and Lymphatic: negative Breast: negative for breast lumps, feels that breast mass is larger. Musculoskeletal: negative, c/o hand pain. Remaining ROS negative.  Physical Exam: Blood pressure 135/74, pulse 85, temperature 98.8 F (37.1 C), temperature source Oral, resp. rate 20, height 5' 3.5" (1.613 m), weight 147 lb (66.679 kg). ECOG: 0 General appearance: alert, cooperative and appears stated age Head: Normocephalic, without obvious abnormality, atraumatic Neck: no adenopathy, no carotid bruit, no JVD, supple, symmetrical, trachea midline and thyroid not enlarged, symmetric, no  tenderness/mass/nodules Lymph nodes: Cervical, supraclavicular, and axillary nodes normal. Cardiac : regular rate and rhythm, no murmurs or gallops Pulmonary:clear to auscultation bilaterally and normal percussion bilaterally Breasts: rt breast mass ~ 2 cm  Central portion . Abdomen:soft, non-tender; bowel sounds normal; no masses,  no organomegaly Extremities negative Neuro: alert, oriented, normal speech, no focal findings or movement disorder noted  Lab Results: Lab Results  Component Value Date   WBC 9.3 08/28/2011   HGB 13.1 08/28/2011   HCT 38.0 08/28/2011   MCV 88.2 08/28/2011   PLT 283 08/28/2011     Chemistry      Component Value Date/Time   NA 138 08/28/2011 1540   NA 144 03/06/2011 0840   K 3.9 08/28/2011 1540   K 4.0 03/06/2011 0840   CL 99 08/28/2011 1540   CL 106 03/06/2011 0840   CO2 30 08/28/2011 1540   CO2 28 03/06/2011 0840   BUN 18 08/28/2011 1540   BUN 17 03/06/2011 0840   CREATININE 0.75 08/28/2011 1540   CREATININE 0.8 03/06/2011 0840      Component Value Date/Time   CALCIUM 9.7 08/28/2011 1540   CALCIUM 8.5 03/06/2011 0840   ALKPHOS 47 03/06/2011 0840   ALKPHOS 66 12/10/2010 0941   AST 30 03/06/2011 0840   AST 22 12/10/2010 0941   ALT 18 12/10/2010 0941   BILITOT 0.40 03/06/2011 0840   BILITOT 0.2* 12/10/2010 0941      .pathology. Radiological Studies: chest X-ray n/a Mammogram n/a Bone density n/a  Impression and Plan: Stacey [name] is doing well I discussed going ahead and stain on [name]. She is on radiation. I will plan serial followup in about 3 months time. More than 50% of the visit was spent in patient-related counselling   [name], MD 9/30/20131:19 PM

## 2011-11-02 NOTE — Telephone Encounter (Signed)
Made patient appointment for 02-02-2012 starting at 10:00am

## 2011-11-02 NOTE — Telephone Encounter (Signed)
gve the pt her dec 2013 appt calendar °

## 2011-11-03 ENCOUNTER — Ambulatory Visit
Admission: RE | Admit: 2011-11-03 | Discharge: 2011-11-03 | Disposition: A | Discharge status: Home / Self Care | Payer: Medicare Other | Source: Ambulatory Visit | Attending: Radiation Oncology | Admitting: Radiation Oncology

## 2011-11-03 DIAGNOSIS — C50919 Malignant neoplasm of unspecified site of unspecified female breast: Secondary | ICD-10-CM | POA: Diagnosis not present

## 2011-11-03 DIAGNOSIS — J449 Chronic obstructive pulmonary disease, unspecified: Secondary | ICD-10-CM | POA: Diagnosis not present

## 2011-11-03 DIAGNOSIS — E785 Hyperlipidemia, unspecified: Secondary | ICD-10-CM | POA: Diagnosis not present

## 2011-11-03 DIAGNOSIS — F17200 Nicotine dependence, unspecified, uncomplicated: Secondary | ICD-10-CM | POA: Diagnosis not present

## 2011-11-03 DIAGNOSIS — Z9221 Personal history of antineoplastic chemotherapy: Secondary | ICD-10-CM | POA: Diagnosis not present

## 2011-11-03 DIAGNOSIS — Z803 Family history of malignant neoplasm of breast: Secondary | ICD-10-CM | POA: Diagnosis not present

## 2011-11-03 DIAGNOSIS — C50419 Malignant neoplasm of upper-outer quadrant of unspecified female breast: Secondary | ICD-10-CM | POA: Diagnosis not present

## 2011-11-03 DIAGNOSIS — Z510 Encounter for antineoplastic radiation therapy: Secondary | ICD-10-CM | POA: Diagnosis not present

## 2011-11-03 DIAGNOSIS — C773 Secondary and unspecified malignant neoplasm of axilla and upper limb lymph nodes: Secondary | ICD-10-CM | POA: Diagnosis not present

## 2011-11-03 DIAGNOSIS — Z79899 Other long term (current) drug therapy: Secondary | ICD-10-CM | POA: Diagnosis not present

## 2011-11-03 DIAGNOSIS — Z807 Family history of other malignant neoplasms of lymphoid, hematopoietic and related tissues: Secondary | ICD-10-CM | POA: Diagnosis not present

## 2011-11-03 DIAGNOSIS — Z7982 Long term (current) use of aspirin: Secondary | ICD-10-CM | POA: Diagnosis not present

## 2011-11-03 DIAGNOSIS — Z800 Family history of malignant neoplasm of digestive organs: Secondary | ICD-10-CM | POA: Diagnosis not present

## 2011-11-04 ENCOUNTER — Ambulatory Visit
Admission: RE | Admit: 2011-11-04 | Discharge: 2011-11-04 | Disposition: A | Discharge status: Home / Self Care | Payer: Medicare Other | Source: Ambulatory Visit | Attending: Radiation Oncology | Admitting: Radiation Oncology

## 2011-11-05 ENCOUNTER — Ambulatory Visit
Admission: RE | Admit: 2011-11-05 | Discharge: 2011-11-05 | Disposition: A | Discharge status: Home / Self Care | Payer: Medicare Other | Source: Ambulatory Visit | Attending: Radiation Oncology | Admitting: Radiation Oncology

## 2011-11-06 ENCOUNTER — Encounter: Payer: Self-pay | Admitting: Radiation Oncology

## 2011-11-06 ENCOUNTER — Ambulatory Visit
Admission: RE | Admit: 2011-11-06 | Discharge: 2011-11-06 | Disposition: A | Discharge status: Home / Self Care | Payer: Medicare Other | Source: Ambulatory Visit | Attending: Radiation Oncology | Admitting: Radiation Oncology

## 2011-11-06 VITALS — BP 117/55 | HR 90 | Temp 97.8°F | Resp 20 | Wt 145.8 lb

## 2011-11-06 DIAGNOSIS — C50911 Malignant neoplasm of unspecified site of right female breast: Secondary | ICD-10-CM

## 2011-11-06 MED ORDER — BIAFINE EX EMUL
Freq: Two times a day (BID) | CUTANEOUS | Status: DC
  Administered 2011-11-06: 15:00:00 via TOPICAL

## 2011-11-06 NOTE — Telephone Encounter (Signed)
No clue why this pops up.  But it is Epic. 

## 2011-11-06 NOTE — Progress Notes (Signed)
Patient here for weekly rad txs right breast,14/33 txs completed, skin dermatitis on breast,c/o itching  A lot, using radiaplex gel bid, and prn Eating and  drinking well, walks 2.5 miles q morning 2:25 PM

## 2011-11-06 NOTE — Addendum Note (Signed)
Encounter addended by: Glennie Hawk, RN on: 11/06/2011  2:55 PM<BR>     Documentation filed: Inpatient MAR, Orders

## 2011-11-06 NOTE — Progress Notes (Signed)
  Radiation Oncology         [phone] ________________________________  Name: Stacey [name] MRN: [id number]  Date: 11/06/2011  DOB: January 29, 1941  Weekly Radiation Therapy Management  Current Dose: 25.2 Gy     Planned Dose:  60.4 Gy  Narrative . . . . . . . . The patient presents for routine under treatment assessment.                                                     The patient is noting pruritis                                 Set-up films were reviewed.                                 The chart was checked. Physical Findings. . .  weight is 145 lb 12.8 oz (66.134 kg). Her oral temperature is 97.8 F (36.6 C). Her blood pressure is 117/55 and her pulse is 90. Her respiration is 20. . Weight essentially stable.  No significant changes. Impression . . . . . . . The patient is  tolerating radiation. Plan . . . . . . . . . . . . Continue treatment as planned.  Biafine given.  ________________________________  Artist Pais [name], M.D.

## 2011-11-09 ENCOUNTER — Encounter: Payer: Self-pay | Admitting: Radiation Oncology

## 2011-11-09 ENCOUNTER — Ambulatory Visit
Admission: RE | Admit: 2011-11-09 | Discharge: 2011-11-09 | Disposition: A | Discharge status: Home / Self Care | Payer: Medicare Other | Source: Ambulatory Visit | Attending: Radiation Oncology | Admitting: Radiation Oncology

## 2011-11-09 DIAGNOSIS — C50419 Malignant neoplasm of upper-outer quadrant of unspecified female breast: Secondary | ICD-10-CM | POA: Diagnosis not present

## 2011-11-09 NOTE — Progress Notes (Signed)
   Department of Radiation Oncology  Phone:  [phone] Fax:        [phone]   Name: Stacey [name]   MRN: [id number]  Date:  11/09/2011   DOB: 27-Nov-1940  Virtual Simulation  NARRATIVE: Stacey [name] underwent complex simulation and treatment planning for her boost treatment today.  Her tumor volume was outlined on the planning CT scan.  The depth of her cavity was verified and electrons could be used.  One complex treatment device was fabricated today to shape a cerrobend block around the lumpectomy cavity incorporating the scar. The plan will be prescribed to the 90% Isodose line. I have requested a special port plan to characterize the depth dose distribution in order to cover the target and spare the underlying lung.  ------------------------------------------------  Artist Pais. [name], M.D.

## 2011-11-10 ENCOUNTER — Ambulatory Visit
Admission: RE | Admit: 2011-11-10 | Discharge: 2011-11-10 | Disposition: A | Discharge status: Home / Self Care | Payer: Medicare Other | Source: Ambulatory Visit | Attending: Radiation Oncology | Admitting: Radiation Oncology

## 2011-11-10 DIAGNOSIS — C50419 Malignant neoplasm of upper-outer quadrant of unspecified female breast: Secondary | ICD-10-CM | POA: Diagnosis not present

## 2011-11-11 ENCOUNTER — Ambulatory Visit
Admission: RE | Admit: 2011-11-11 | Discharge: 2011-11-11 | Disposition: A | Discharge status: Home / Self Care | Payer: Medicare Other | Source: Ambulatory Visit | Attending: Radiation Oncology | Admitting: Radiation Oncology

## 2011-11-12 ENCOUNTER — Ambulatory Visit
Admission: RE | Admit: 2011-11-12 | Discharge: 2011-11-12 | Disposition: A | Discharge status: Home / Self Care | Payer: Medicare Other | Source: Ambulatory Visit | Attending: Radiation Oncology | Admitting: Radiation Oncology

## 2011-11-13 ENCOUNTER — Ambulatory Visit
Admission: RE | Admit: 2011-11-13 | Discharge: 2011-11-13 | Disposition: A | Discharge status: Home / Self Care | Payer: Medicare Other | Source: Ambulatory Visit | Attending: Radiation Oncology | Admitting: Radiation Oncology

## 2011-11-13 ENCOUNTER — Encounter: Payer: Self-pay | Admitting: Radiation Oncology

## 2011-11-13 VITALS — BP 131/65 | HR 82 | Temp 98.3°F | Resp 20 | Wt 146.9 lb

## 2011-11-13 DIAGNOSIS — C50911 Malignant neoplasm of unspecified site of right female breast: Secondary | ICD-10-CM

## 2011-11-13 NOTE — Progress Notes (Signed)
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 She has not tried Cortisone cream. Pt states she may take Benadryl at night to sleep better.

## 2011-11-13 NOTE — Progress Notes (Signed)
  Radiation Oncology         [phone] ________________________________  Name: Stacey [name] MRN: [id number]  Date: 11/13/2011  DOB: 03/22/1940  Weekly Radiation Therapy Management  Current Dose: 34.2 Gy     Planned Dose:  60.4 Gy  Narrative . . . . . . . . The patient presents for routine under treatment assessment.                                                     The patient is without complaint.                                 Set-up films were reviewed.                                 The chart was checked. Physical Findings. . .  weight is 146 lb 14.4 oz (66.633 kg). Her oral temperature is 98.3 F (36.8 C). Her blood pressure is 131/65 and her pulse is 82. Her respiration is 20. . Weight essentially stable.  No significant changes. Impression . . . . . . . The patient is  tolerating radiation. Plan . . . . . . . . . . . . Continue treatment as planned.  ________________________________  Artist Pais. [name], M.D.

## 2011-11-16 ENCOUNTER — Ambulatory Visit
Admission: RE | Admit: 2011-11-16 | Discharge: 2011-11-16 | Disposition: A | Discharge status: Home / Self Care | Payer: Medicare Other | Source: Ambulatory Visit | Attending: Radiation Oncology | Admitting: Radiation Oncology

## 2011-11-17 ENCOUNTER — Ambulatory Visit
Admission: RE | Admit: 2011-11-17 | Discharge: 2011-11-17 | Disposition: A | Discharge status: Home / Self Care | Payer: Medicare Other | Source: Ambulatory Visit | Attending: Radiation Oncology | Admitting: Radiation Oncology

## 2011-11-17 DIAGNOSIS — C50419 Malignant neoplasm of upper-outer quadrant of unspecified female breast: Secondary | ICD-10-CM | POA: Diagnosis not present

## 2011-11-18 ENCOUNTER — Ambulatory Visit
Admission: RE | Admit: 2011-11-18 | Discharge: 2011-11-18 | Disposition: A | Discharge status: Home / Self Care | Payer: Medicare Other | Source: Ambulatory Visit | Attending: Radiation Oncology | Admitting: Radiation Oncology

## 2011-11-19 ENCOUNTER — Ambulatory Visit
Admission: RE | Admit: 2011-11-19 | Discharge: 2011-11-19 | Disposition: A | Discharge status: Home / Self Care | Payer: Medicare Other | Source: Ambulatory Visit | Attending: Radiation Oncology | Admitting: Radiation Oncology

## 2011-11-20 ENCOUNTER — Ambulatory Visit
Admission: RE | Admit: 2011-11-20 | Discharge: 2011-11-20 | Disposition: A | Discharge status: Home / Self Care | Payer: Medicare Other | Source: Ambulatory Visit | Attending: Radiation Oncology | Admitting: Radiation Oncology

## 2011-11-20 ENCOUNTER — Encounter: Payer: Self-pay | Admitting: Radiation Oncology

## 2011-11-20 VITALS — BP 149/69 | HR 82 | Temp 98.6°F | Resp 20 | Wt 149.9 lb

## 2011-11-20 DIAGNOSIS — C50911 Malignant neoplasm of unspecified site of right female breast: Secondary | ICD-10-CM

## 2011-11-20 NOTE — Progress Notes (Signed)
  Radiation Oncology         [phone] ________________________________  Name: Stacey [name] MRN: [id number]  Date: 11/20/2011  DOB: 01-23-1941  Weekly Radiation Therapy Management  Current Dose: 43.2 Gy     Planned Dose:  60.4 Gy  Narrative . . . . . . . . The patient presents for routine under treatment assessment.                                                     The patient is without complaint.                                 Set-up films were reviewed.                                 The chart was checked. Physical Findings. . .  weight is 149 lb 14.4 oz (67.994 kg). Her oral temperature is 98.6 F (37 C). Her blood pressure is 149/69 and her pulse is 82. Her respiration is 20. . Weight essentially stable.  No significant changes. Impression . . . . . . . The patient is  tolerating radiation. Plan . . . . . . . . . . . . Continue treatment as planned.  ________________________________  Artist Pais. [name], M.D.

## 2011-11-20 NOTE — Progress Notes (Signed)
Pt c/o fatigue, irritation of skin in tx area right breast and upper right back. She is applying Cortisone cream and Biafine bid. Taking Benadryl qhs w/good relief of itching.

## 2011-11-23 ENCOUNTER — Ambulatory Visit
Admission: RE | Admit: 2011-11-23 | Discharge: 2011-11-23 | Disposition: A | Discharge status: Home / Self Care | Payer: Medicare Other | Source: Ambulatory Visit | Attending: Radiation Oncology | Admitting: Radiation Oncology

## 2011-11-23 DIAGNOSIS — C50919 Malignant neoplasm of unspecified site of unspecified female breast: Secondary | ICD-10-CM

## 2011-11-23 MED ORDER — BIAFINE EX EMUL
Freq: Two times a day (BID) | CUTANEOUS | Status: DC
  Administered 2011-11-23: 14:00:00 via TOPICAL

## 2011-11-24 ENCOUNTER — Ambulatory Visit
Admission: RE | Admit: 2011-11-24 | Discharge: 2011-11-24 | Disposition: A | Discharge status: Home / Self Care | Payer: Medicare Other | Source: Ambulatory Visit | Attending: Radiation Oncology | Admitting: Radiation Oncology

## 2011-11-24 DIAGNOSIS — C50419 Malignant neoplasm of upper-outer quadrant of unspecified female breast: Secondary | ICD-10-CM | POA: Diagnosis not present

## 2011-11-25 ENCOUNTER — Ambulatory Visit
Admission: RE | Admit: 2011-11-25 | Discharge: 2011-11-25 | Disposition: A | Discharge status: Home / Self Care | Payer: Medicare Other | Source: Ambulatory Visit | Attending: Radiation Oncology | Admitting: Radiation Oncology

## 2011-11-25 ENCOUNTER — Ambulatory Visit: Payer: Medicare Other

## 2011-11-26 ENCOUNTER — Encounter: Payer: Self-pay | Admitting: Radiation Oncology

## 2011-11-26 ENCOUNTER — Ambulatory Visit: Payer: Medicare Other

## 2011-11-26 ENCOUNTER — Ambulatory Visit
Admission: RE | Admit: 2011-11-26 | Discharge: 2011-11-26 | Disposition: A | Discharge status: Home / Self Care | Payer: Medicare Other | Source: Ambulatory Visit | Attending: Radiation Oncology | Admitting: Radiation Oncology

## 2011-11-26 ENCOUNTER — Ambulatory Visit
Admission: RE | Admit: 2011-11-26 | Discharge: 2011-11-26 | Payer: Medicare Other | Source: Ambulatory Visit | Attending: Radiation Oncology | Admitting: Radiation Oncology

## 2011-11-26 DIAGNOSIS — C50911 Malignant neoplasm of unspecified site of right female breast: Secondary | ICD-10-CM

## 2011-11-26 NOTE — Progress Notes (Signed)
  Radiation Oncology         [phone] ________________________________  Name: Stacey [name] MRN: [id number]  Date: 11/26/2011  DOB: 01/30/1941  Weekly Radiation Therapy Management  Current Dose: 39.6 Gy     Planned Dose:  75 Gy  Narrative . . . . . . . . The patient presents for routine under treatment assessment.                                                                            The patient is without complaint other than ongoing dermatitis.                                 Set-up films were reviewed.  I verified her electron beam                                 The chart was checked. Physical Findings. . . Weight essentially stable.  Skin erythematous in field no desquamation. Impression . . . . . . . The patient is  tolerating radiation. Plan . . . . . . . . . . . . Continue treatment as planned.  ________________________________  Artist Pais. [name], M.D.

## 2011-11-27 ENCOUNTER — Ambulatory Visit
Admission: RE | Admit: 2011-11-27 | Discharge: 2011-11-27 | Disposition: A | Discharge status: Home / Self Care | Payer: Medicare Other | Source: Ambulatory Visit | Attending: Radiation Oncology | Admitting: Radiation Oncology

## 2011-11-27 ENCOUNTER — Ambulatory Visit: Payer: Medicare Other

## 2011-11-30 ENCOUNTER — Ambulatory Visit
Admission: RE | Admit: 2011-11-30 | Discharge: 2011-11-30 | Disposition: A | Discharge status: Home / Self Care | Payer: Medicare Other | Source: Ambulatory Visit | Attending: Radiation Oncology | Admitting: Radiation Oncology

## 2011-11-30 ENCOUNTER — Ambulatory Visit: Payer: Medicare Other

## 2011-12-01 ENCOUNTER — Ambulatory Visit: Payer: Medicare Other

## 2011-12-01 ENCOUNTER — Ambulatory Visit
Admission: RE | Admit: 2011-12-01 | Discharge: 2011-12-01 | Disposition: A | Discharge status: Home / Self Care | Payer: Medicare Other | Source: Ambulatory Visit | Attending: Radiation Oncology | Admitting: Radiation Oncology

## 2011-12-01 DIAGNOSIS — C50419 Malignant neoplasm of upper-outer quadrant of unspecified female breast: Secondary | ICD-10-CM | POA: Diagnosis not present

## 2011-12-02 ENCOUNTER — Ambulatory Visit
Admission: RE | Admit: 2011-12-02 | Discharge: 2011-12-02 | Disposition: A | Discharge status: Home / Self Care | Payer: Medicare Other | Source: Ambulatory Visit | Attending: Radiation Oncology | Admitting: Radiation Oncology

## 2011-12-02 ENCOUNTER — Ambulatory Visit: Payer: Medicare Other

## 2011-12-03 ENCOUNTER — Encounter: Payer: Self-pay | Admitting: Radiation Oncology

## 2011-12-03 ENCOUNTER — Ambulatory Visit
Admission: RE | Admit: 2011-12-03 | Discharge: 2011-12-03 | Disposition: A | Discharge status: Home / Self Care | Payer: Medicare Other | Source: Ambulatory Visit | Attending: Radiation Oncology | Admitting: Radiation Oncology

## 2011-12-03 ENCOUNTER — Ambulatory Visit: Payer: Medicare Other

## 2011-12-03 VITALS — BP 132/75 | HR 101 | Temp 98.3°F | Resp 20 | Wt 147.9 lb

## 2011-12-03 DIAGNOSIS — C50911 Malignant neoplasm of unspecified site of right female breast: Secondary | ICD-10-CM

## 2011-12-03 NOTE — Progress Notes (Signed)
  Radiation Oncology         [phone] ________________________________  Name: Stacey [name] MRN: [id number]  Date: 12/03/2011  DOB: [birth year]  Weekly Radiation Therapy Management  Current Dose: 60.4 Gy     Planned Dose:  60.4 Gy  Narrative . . . . . . . . The patient presents for the final under treatment assessment.                                           The patient has had some continuation of previously noted symptoms.                                 Set-up films were reviewed.                                 The chart was checked. Physical Findings. . . Weight essentially stable.  No significant changes. Impression . . . . . . . The patient tolerated radiation relatively well. Plan . . . . . . . . . . . . Complete radiation today as scheduled, and follow-up in one month. The patient was encouraged to call or return to the clinic in the interim for any worsening symptoms. Miconazole powder.  ________________________________  Artist Pais [name], M.D.

## 2011-12-03 NOTE — Progress Notes (Signed)
Pt completed tx today, states she is recovering from a cold, still coughing, not sleeping well. Pt applying Biafine to skin right breast tx area. C/o skin tenderness, pain, burning, itching from radiation dermatitis.  Pt states she has "moist area under breast"; advised Neosporin there. Fatigued. Pt has 1 month fu card.

## 2011-12-07 NOTE — Progress Notes (Signed)
  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 The breast and regional lymph nodes were initially treated to 50.4 gray in 28 fractions of 1.8 gray 2. The surgical bed was boosted to 60.4 gray with 5 additional fractions of 2 gray.  Beams/energy:     1. A.  the whole right breast was treated using tangential photon fields with 6 megavolt x-rays delivered to the medial and lateral beams. These fields were designed to exclude intrathoracic contents with customized blocking from each angle while encompassing the entire breast tissue. Wedge compensators were used as well as in field reduced field compensation.  Isocenter was verified weekly with port films  B.  the right supraclavicular region was treated using a 6 megavolt photon field prescribed 3 cm depth in accordance with standard techniques. This field was junction with the tangential breast fields using a single isocenter setup  C.  the dose to the right axilla was supplemented using a posterior oblique field with 6 megavolt photons prescribed to the mid axilla receiving a fractional daily dose.  2  the lumpectomy cavity was boosted using a 9 MeV electron beam prescribed to a depth of 90% dose. The location and shape of the electron beam was designed to encompass the patient's lumpectomy site and primary tumor location using the CT findings suggestive of the cavity location as well as the patient's overlying surgical scar.  Narrative: The patient tolerated radiation treatment relatively well.   She  experienced radiation dermatitis, as anticipated. This was characterized as erythema with some dry desquamation type changes. She had a follicular itchy reaction and muscle were exposed portion of the upper-inner breast. Otherwise, she tolerated radiation treatment with limited effects.  Plan: The patient has completed radiation treatment. The patient will return to radiation oncology clinic for routine followup in one month. I advised them to call or return sooner if they have any questions or concerns related to their recovery or treatment. ________________________________  Artist Pais. [name], M.D.

## 2011-12-17 DIAGNOSIS — Z23 Encounter for immunization: Secondary | ICD-10-CM | POA: Diagnosis not present

## 2012-01-06 ENCOUNTER — Encounter: Payer: Self-pay | Admitting: Radiation Oncology

## 2012-01-06 DIAGNOSIS — C801 Malignant (primary) neoplasm, unspecified: Secondary | ICD-10-CM | POA: Insufficient documentation

## 2012-01-06 DIAGNOSIS — C50919 Malignant neoplasm of unspecified site of unspecified female breast: Secondary | ICD-10-CM | POA: Insufficient documentation

## 2012-01-06 DIAGNOSIS — J449 Chronic obstructive pulmonary disease, unspecified: Secondary | ICD-10-CM | POA: Insufficient documentation

## 2012-01-06 DIAGNOSIS — Z923 Personal history of irradiation: Secondary | ICD-10-CM | POA: Insufficient documentation

## 2012-01-07 ENCOUNTER — Ambulatory Visit
Admission: RE | Admit: 2012-01-07 | Discharge: 2012-01-07 | Disposition: A | Discharge status: Home / Self Care | Payer: Medicare Other | Source: Ambulatory Visit | Attending: Radiation Oncology | Admitting: Radiation Oncology

## 2012-01-07 ENCOUNTER — Encounter: Payer: Self-pay | Admitting: Radiation Oncology

## 2012-01-07 NOTE — Progress Notes (Signed)
Patient was informed there was an unfortunate  delay in being seen by Md, apologized to patient  and asked if she wanted top wait or reschedule with MD; patient decided to reschedule at  another time, gave  Her an appt card  2:31 PM

## 2012-01-07 NOTE — Progress Notes (Signed)
Patient here follow up s/p rad txs Right breast ca:10/19/11-12/03/11 Patient alert,oriented x3, breast well healed,no rash, still slight discoloration, nipple still itches,uses cortisone cream, stopped using the lotions and miconazole powder, No c/o pain, still has fatigue, gets 7-8 hours sleep , walks on treadmill 1/2 hour daily  2:22 PM

## 2012-01-21 ENCOUNTER — Ambulatory Visit
Admission: RE | Admit: 2012-01-21 | Discharge: 2012-01-21 | Disposition: A | Discharge status: Home / Self Care | Payer: Medicare Other | Source: Ambulatory Visit | Attending: Radiation Oncology | Admitting: Radiation Oncology

## 2012-01-21 ENCOUNTER — Encounter: Payer: Self-pay | Admitting: Radiation Oncology

## 2012-01-21 VITALS — BP 121/75 | HR 98 | Temp 98.2°F | Wt 149.8 lb

## 2012-01-21 DIAGNOSIS — C50919 Malignant neoplasm of unspecified site of unspecified female breast: Secondary | ICD-10-CM

## 2012-01-21 DIAGNOSIS — C50911 Malignant neoplasm of unspecified site of right female breast: Secondary | ICD-10-CM

## 2012-01-21 NOTE — Progress Notes (Signed)
Here today for Fu after receiving Radiation Therapy.  C/o fatigue today she has tired herself out from shopping all day. March Mammogram attached. Denies pain in right breast but she complains of frequent itching of right nipple which at times is also sore.

## 2012-01-21 NOTE — Progress Notes (Signed)
  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  She is fatigued from radiation and has some breast dryness/itchiness, but, is otherwise without complaint.                              ALLERGIES:  is allergic to clindamycin/lincomycin and codeine.  Meds: Current Outpatient Prescriptions  Medication Sig Dispense Refill  . aspirin 81 MG EC tablet Take 81 mg by mouth daily.        . calcium-vitamin D (OSCAL WITH D) 500-200 MG-UNIT per tablet Take 1 tablet by mouth daily.      . celecoxib (CELEBREX) 200 MG capsule Take 1 capsule (200 mg total) by mouth 2 (two) times daily as needed for pain.  60 capsule  11  . cholecalciferol (VITAMIN D) 1000 UNITS tablet Take 1,000 Units by mouth daily.        . diphenhydrAMINE (BENADRYL) 25 MG tablet Take 25 mg by mouth every 6 (six) hours as needed.      Marland Kitchen emollient (BIAFINE) cream Apply topically 2 (two) times daily.      Marland Kitchen EPINEPHrine (EPIPEN) 0.3 mg/0.3 mL DEVI Use as directed       . fluticasone (FLONASE) 50 MCG/ACT nasal spray       . hyaluronate sodium (RADIAPLEXRX) GEL Apply topically 2 (two) times daily.      Marland Kitchen letrozole ([name]) 2.5 MG tablet Take 1 tablet (2.5 mg total) by mouth daily.  30 tablet  11  . Multiple Vitamin (MULTIVITAMIN) capsule Take 1 capsule by mouth daily.        . non-metallic deodorant Thornton Papas) MISC Apply 1 application topically daily as needed.      Marland Kitchen omeprazole (PRILOSEC) 20 MG capsule Take 1 capsule (20 mg total) by mouth daily.  30 capsule  11  . polyethylene glycol (MIRALAX / GLYCOLAX) packet Take 17 g by mouth daily.      .  polyethylene glycol powder (GLYCOLAX/MIRALAX) powder use as directed  527 g  8  . pseudoephedrine (SUDAFED) 30 MG tablet Take 30 mg by mouth daily.       Marland Kitchen zolpidem (AMBIEN) 10 MG tablet take 1 tablet by mouth at bedtime if needed  30 tablet  5    Physical Findings: The patient is in no acute distress. Patient is alert and oriented.  weight is 149 lb 12.8 oz (67.949 kg). Her temperature is 98.2 F (36.8 C). Her blood pressure is 121/75 and her pulse is 98.   the patient's neck and supraclavicular region is free of adenopathy bilaterally. Her breasts are symmetric and seated position. A supine position, palpation of the right breast reveals no significant discomfort. The skin along the right breast shows modest hyperpigmentation with no residual desquamation.  No significant changes.  Impression:  The patient is recovering from the effects of radiation.    Plan:  Followup in 6 months  _____________________________________  Artist Pais. [name], M.D.

## 2012-02-02 ENCOUNTER — Ambulatory Visit (HOSPITAL_BASED_OUTPATIENT_CLINIC_OR_DEPARTMENT_OTHER): Payer: Medicare Other | Admitting: Oncology

## 2012-02-02 ENCOUNTER — Telehealth: Payer: Self-pay | Admitting: *Deleted

## 2012-02-02 ENCOUNTER — Other Ambulatory Visit (HOSPITAL_BASED_OUTPATIENT_CLINIC_OR_DEPARTMENT_OTHER): Payer: Medicare Other | Admitting: Lab

## 2012-02-02 VITALS — BP 123/72 | HR 88 | Temp 97.9°F | Resp 20 | Ht 63.5 in | Wt 150.5 lb

## 2012-02-02 DIAGNOSIS — C50219 Malignant neoplasm of upper-inner quadrant of unspecified female breast: Secondary | ICD-10-CM

## 2012-02-02 DIAGNOSIS — Z170 Estrogen receptor positive status [ER+]: Secondary | ICD-10-CM | POA: Diagnosis not present

## 2012-02-02 DIAGNOSIS — C50911 Malignant neoplasm of unspecified site of right female breast: Secondary | ICD-10-CM

## 2012-02-02 DIAGNOSIS — E559 Vitamin D deficiency, unspecified: Secondary | ICD-10-CM | POA: Diagnosis not present

## 2012-02-02 DIAGNOSIS — C773 Secondary and unspecified malignant neoplasm of axilla and upper limb lymph nodes: Secondary | ICD-10-CM | POA: Diagnosis not present

## 2012-02-02 LAB — CBC WITH DIFFERENTIAL/PLATELET
BASO%: 0.4 % (ref 0.0–2.0)
LYMPH%: 17.7 % (ref 14.0–49.7)
MCHC: 34.3 g/dL (ref 31.5–36.0)
MONO#: 0.6 10*3/uL (ref 0.1–0.9)
MONO%: 9.6 % (ref 0.0–14.0)
NEUT#: 4.2 10*3/uL (ref 1.5–6.5)
Platelets: 236 10*3/uL (ref 145–400)
RBC: 4.15 10*6/uL (ref 3.70–5.45)
RDW: 13.3 % (ref 11.2–14.5)
WBC: 6.2 10*3/uL (ref 3.9–10.3)

## 2012-02-02 LAB — COMPREHENSIVE METABOLIC PANEL (CC13)
ALT: 16 U/L (ref 0–55)
Albumin: 3.3 g/dL — ABNORMAL LOW (ref 3.5–5.0)
Alkaline Phosphatase: 61 U/L (ref 40–150)
CO2: 26 mEq/L (ref 22–29)
Potassium: 4.2 mEq/L (ref 3.5–5.1)
Sodium: 141 mEq/L (ref 136–145)
Total Bilirubin: 0.26 mg/dL (ref 0.20–1.20)
Total Protein: 6.3 g/dL — ABNORMAL LOW (ref 6.4–8.3)

## 2012-02-02 NOTE — Telephone Encounter (Signed)
Gave patient instructions for getting her 2014 appointment

## 2012-02-02 NOTE — Progress Notes (Signed)
Hematology and Oncology Follow Up Visit  Stacey [name] [id number] 1940-10-28 71 y.o. 02/02/2012 10:45 AM PCP  Principle Diagnosis: 71 yo woman with hx of locally advanced lobular cancer currently on femara starting in 11/12.  Previous hx lt breast cancer 1987, s/p AC x 4, and xrt to lt breastfollowed by tam x 5y  Interim History:  Since being seen last she she had surgery on 09/02/2001. Final pathology showed a 2.5 cm residual lobular cancer with one out of one involved lymph node. The tumor was ER/PR positive HER-2 is negative. Radiation completed 12/03/11  , she is quite fatigued.  Medications: I have reviewed the patient's current medications.  Allergies:  Allergies  Allergen Reactions  . Clindamycin/Lincomycin Diarrhea and Nausea And Vomiting  . Codeine Nausea And Vomiting    Past Medical History, Surgical history, Social history, and Family History were reviewed and updated.  Review of Systems: Constitutional:  Negative for fever, chills, night sweats, anorexia, weight loss, pain. Cardiovascular: no chest pain or dyspnea on exertion Respiratory: no cough, shortness of breath, or wheezing Neurological: no TIA or stroke symptoms Dermatological: negative ENT: negative Skin Gastrointestinal: no abdominal pain, change in bowel habits, or black or bloody stools Genito-Urinary: no dysuria, trouble voiding, or hematuria Hematological and Lymphatic: negative Breast: negative for breast lumps, feels that breast mass is larger. Musculoskeletal: negative, c/o hand pain. Remaining ROS negative.  Physical Exam: Blood pressure 123/72, pulse 88, temperature 97.9 F (36.6 C), resp. rate 20, height 5' 3.5" (1.613 m), weight 150 lb 8 oz (68.266 kg). ECOG: 0 General appearance: alert, cooperative and appears stated age Head: Normocephalic, without obvious abnormality, atraumatic Neck: no adenopathy, no carotid bruit, no JVD, supple, symmetrical, trachea midline and thyroid not enlarged,  symmetric, no tenderness/mass/nodules Lymph nodes: Cervical, supraclavicular, and axillary nodes normal. Cardiac : regular rate and rhythm, no murmurs or gallops Pulmonary:clear to auscultation bilaterally and normal percussion bilaterally Breasts: rt breast mass ~ 2 cm  Central portion . Abdomen:soft, non-tender; bowel sounds normal; no masses,  no organomegaly Extremities negative Neuro: alert, oriented, normal speech, no focal findings or movement disorder noted  Lab Results: Lab Results  Component Value Date   WBC 6.2 02/02/2012   HGB 12.8 02/02/2012   HCT 37.3 02/02/2012   MCV 90.0 02/02/2012   PLT 236 02/02/2012     Chemistry      Component Value Date/Time   NA 141 02/02/2012 0939   NA 138 08/28/2011 1540   NA 144 03/06/2011 0840   K 4.2 02/02/2012 0939   K 3.9 08/28/2011 1540   K 4.0 03/06/2011 0840   CL 107 02/02/2012 0939   CL 99 08/28/2011 1540   CL 106 03/06/2011 0840   CO2 26 02/02/2012 0939   CO2 30 08/28/2011 1540   CO2 28 03/06/2011 0840   BUN 17.0 02/02/2012 0939   BUN 18 08/28/2011 1540   BUN 17 03/06/2011 0840   CREATININE 0.8 02/02/2012 0939   CREATININE 0.75 08/28/2011 1540   CREATININE 0.8 03/06/2011 0840      Component Value Date/Time   CALCIUM 9.2 02/02/2012 0939   CALCIUM 9.7 08/28/2011 1540   CALCIUM 8.5 03/06/2011 0840   ALKPHOS 61 02/02/2012 0939   ALKPHOS 47 03/06/2011 0840   ALKPHOS 66 12/10/2010 0941   AST 23 02/02/2012 0939   AST 30 03/06/2011 0840   AST 22 12/10/2010 0941   ALT 16 02/02/2012 0939   ALT 18 12/10/2010 0941   BILITOT 0.26 02/02/2012 4098  BILITOT 0.40 03/06/2011 0840   BILITOT 0.2* 12/10/2010 0941      .pathology. Radiological Studies: chest X-ray n/a Mammogram Due 4/14 Bone density   Impression and Plan: Stacey [name] is doing well I discussed going ahead and stain on [name]. She is on radiation. she will be seen in 6 months. More than 50% of the visit was spent in patient-related counselling   [name], MD 12/31/201310:45 AM

## 2012-02-06 ENCOUNTER — Other Ambulatory Visit: Payer: Self-pay | Admitting: Internal Medicine

## 2012-03-08 ENCOUNTER — Encounter (INDEPENDENT_AMBULATORY_CARE_PROVIDER_SITE_OTHER): Payer: Self-pay

## 2012-03-08 DIAGNOSIS — L821 Other seborrheic keratosis: Secondary | ICD-10-CM | POA: Diagnosis not present

## 2012-03-09 ENCOUNTER — Ambulatory Visit (INDEPENDENT_AMBULATORY_CARE_PROVIDER_SITE_OTHER): Payer: Medicare Other | Admitting: Surgery

## 2012-03-09 ENCOUNTER — Encounter (INDEPENDENT_AMBULATORY_CARE_PROVIDER_SITE_OTHER): Payer: Self-pay | Admitting: Surgery

## 2012-03-09 VITALS — BP 120/76 | HR 89 | Temp 98.0°F | Resp 18 | Ht 64.0 in | Wt 148.0 lb

## 2012-03-09 DIAGNOSIS — C50919 Malignant neoplasm of unspecified site of unspecified female breast: Secondary | ICD-10-CM

## 2012-03-09 DIAGNOSIS — C50911 Malignant neoplasm of unspecified site of right female breast: Secondary | ICD-10-CM

## 2012-03-09 NOTE — Progress Notes (Signed)
Name:  Stacey [name] MRN:  [id number] DOB:  May 01, 1940  ASSESSMENT AND PLAN: 1.  Right breast cancer.  12 o'clock.  T2, N1  First seen 11/28/2010.  2.5 cm.  Lobular.  1/1 node positive.  ER - 85%, PR - 57%, Ki67 - 20%, Her2Neu - neg.  She sees Drs. Rubin/Manning and is taking [name].  Took neoadjuvant for 8 months.   Right lumpectomy and right axillary SLNBx - 09/03/2011  She finished the radiation 12/03/2011.  She got fairly severe radiation dermitis - but this has resolved.  I will see her back in 6 months.   2.  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  IDC 2.5, 1/15 nodes positive  Treated with lumpectomy, axillary node dissection by Dr. Ivor Reining.  I discussed with the patient about Dr. Jamey Ripa handling this cancer, but she is going to stay with me.  She last saw Streck/[name] in 01/22/2003.  3.  Anxiety. 4.  Hyperlipidemia. 5.  Had quit smoking.    But has restarted.  We again talked about her quitting.  She said it would take a long trip to [place] for her to quit. 6.  COPD. 7.  Arthritis, on Celebrex.  The arthritis of her hands has acted up. 8.  Second left posterior tooth - implant - sees Dr. Cherlyn Cushing. 9.  Right shoulder pain - overuse injury  REFERRING PHYSICIAN: Oliver Barre, MD  HISTORY OF PRESENT ILLNESS: Stacey [name] is a 72 y.o. (DOB: 1940-06-12)  white female whose primary care physician is Oliver Barre, MD and comes to me today for folllow up of right breast lumpectomy.    She is doing well, though her breast are tender.  She said that it has taken her more to get over the radiation treatment to her right breast, which ended in October, 2013, than she remembers from her last treatment.  She sounds like she had fairly severe radiation dermatitis, but this is resolved.  We talked a fair amount of time about Dr. Renelda Loma leaving.  She saw him the last day he worked.  She is unsure of who will see her going forward, but I tried to reassure her that this would be taken care  of.  She had no other concerns.  Breast History: Presented to Breast Ca Conf on 11/26/2010: 2 cm mass in right breast on [place], but on mammogram poorly seen because of density of breast tissue.  MRI shows 2.2 cm right breast mass Lobular features.    ER/PR pos, Ki67 - 20%, Her2Neu - neg  Past Medical History  Diagnosis Date  . ANEMIA-NOS 12/17/2006  . ANXIETY 05/03/2009  . CONSTIPATION 03/28/2010  . COPD     resolved  . Cough 12/17/2006  . DIVERTICULOSIS, COLON 12/17/2006  . FATIGUE 06/14/2007  . GERD 03/28/2010  . HYPERLIPIDEMIA 06/14/2007  . IRRITABLE BOWEL SYNDROME, HX OF 12/17/2006  . 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 Cancer     bilateral  . Breast cancer 1997    s/p chemo/xrt  . Hx of radiation therapy 10/19/11 -12/03/11    right breast    Current Outpatient Prescriptions  Medication Sig Dispense Refill  . aspirin 81 MG EC tablet Take 81 mg by mouth daily.        . calcium-vitamin D (OSCAL WITH D) 500-200 MG-UNIT per tablet Take 1 tablet by mouth daily.      . celecoxib (CELEBREX) 200 MG capsule Take 1 capsule (200 mg total) by mouth 2 (two) times  daily as needed for pain.  60 capsule  11  . cholecalciferol (VITAMIN D) 1000 UNITS tablet Take 1,000 Units by mouth daily.        . diphenhydrAMINE (BENADRYL) 25 MG tablet Take 25 mg by mouth every 6 (six) hours as needed.      Marland Kitchen EPINEPHrine (EPIPEN) 0.3 mg/0.3 mL DEVI Use as directed       . fluticasone (FLONASE) 50 MCG/ACT nasal spray instill 2 sprays into each nostril once daily  16 g  3  . letrozole ([name]) 2.5 MG tablet Take 1 tablet (2.5 mg total) by mouth daily.  30 tablet  11  . Multiple Vitamin (MULTIVITAMIN) capsule Take 1 capsule by mouth daily.        . non-metallic deodorant Thornton Papas) MISC Apply 1 application topically daily as needed.      Marland Kitchen omeprazole (PRILOSEC) 20 MG capsule Take 1 capsule (20 mg total) by mouth daily.  30 capsule  11  . polyethylene glycol (MIRALAX / GLYCOLAX) packet Take 17 g by mouth daily.      .  polyethylene glycol powder (GLYCOLAX/MIRALAX) powder use as directed  527 g  8  . pseudoephedrine (SUDAFED) 30 MG tablet Take 30 mg by mouth daily.       Marland Kitchen zolpidem (AMBIEN) 10 MG tablet take 1 tablet by mouth at bedtime if needed  30 tablet  5  . emollient (BIAFINE) cream Apply topically 2 (two) times daily.      . hyaluronate sodium (RADIAPLEXRX) GEL Apply topically 2 (two) times daily.        Allergies  Allergen Reactions  . Clindamycin/Lincomycin Diarrhea and Nausea And Vomiting  . Codeine Nausea And Vomiting    REVIEW OF SYSTEMS:  Pulmonary:  Smoke cigarettes. Quit 1 week ago.  COPD.  But has started smoking again. Gastrointestinal:  No history of stomach disease.  No history of liver disease.  No history of gall bladder disease.  No history of pancreas disease.  No history of colon disease. Sees Dr. Kizzie Furnish.  Last colonoscopy 2004, for next one in 2014. Urologic:  No history of kidney stones.  No history of bladder infections. GYN:  Sees Dr. Renato Shin. Musculoskeletal:  Arthritis.  Takes Celebrex for this.  Right bothering today.  Right shoulder has been a problem. Dental:  Her left teeth are better.  SOCIAL and FAMILY HISTORY: Mother of Blondell Reveal (?). [name] with her. I took care of her mother, Darnelle Spangle.  PHYSICAL EXAM: BP 120/76  Pulse 89  Temp 98 F (36.7 C)  Resp 18  Ht 5\' 4"  (1.626 m)  Wt 148 lb (67.132 kg)  BMI 25.40 kg/m2  HEENT:  Pupils equal.  Dentition good. NECK:  Supple.  No thyroid mass. LYMPH NODES:  No cervical, supraclavicular, or axillary adenopathy. BREASTS -  RIGHT:  No palpable mass or nodule.  No nipple discharge.  Some changes from radiation with pigmentation and thickened skin.  Tender.   LEFT:  No palpable mass or nodule.  No nipple discharge.  Tender, a little more so that the right side. UPPER EXTREMITIES:  No evidence of lymphedema.  DATA REVIEWED: Epic.  Ovidio Kin, M.D., [facility],  [place] [phone]

## 2012-04-19 ENCOUNTER — Other Ambulatory Visit: Payer: Self-pay

## 2012-04-19 MED ORDER — ZOLPIDEM TARTRATE 10 MG PO TABS: ORAL_TABLET | ORAL | Status: DC

## 2012-04-19 NOTE — Telephone Encounter (Signed)
Faxed hardcopy to pharmacy. 

## 2012-04-19 NOTE — Telephone Encounter (Signed)
Done hardcopy to robin  

## 2012-05-05 ENCOUNTER — Encounter: Payer: Self-pay | Admitting: Oncology

## 2012-05-05 ENCOUNTER — Telehealth: Payer: Self-pay | Admitting: *Deleted

## 2012-05-05 NOTE — Telephone Encounter (Signed)
Pt returned my call and I confirmed 05/10/12 appt w/ pt.  Mailed letter & calendar to pt.

## 2012-05-05 NOTE — Telephone Encounter (Signed)
Pt called and left me a message about getting an appt because her [name] is getting ready to run out.  I called and left a message for the pt to return my call.

## 2012-05-10 ENCOUNTER — Telehealth: Payer: Self-pay | Admitting: *Deleted

## 2012-05-10 ENCOUNTER — Encounter: Payer: Self-pay | Admitting: Gynecologic Oncology

## 2012-05-10 ENCOUNTER — Ambulatory Visit (HOSPITAL_BASED_OUTPATIENT_CLINIC_OR_DEPARTMENT_OTHER): Payer: Medicare Other | Admitting: Gynecologic Oncology

## 2012-05-10 VITALS — BP 146/78 | HR 106 | Temp 98.1°F | Resp 20 | Ht 64.0 in | Wt 146.6 lb

## 2012-05-10 DIAGNOSIS — C50919 Malignant neoplasm of unspecified site of unspecified female breast: Secondary | ICD-10-CM | POA: Diagnosis not present

## 2012-05-10 DIAGNOSIS — M8580 Other specified disorders of bone density and structure, unspecified site: Secondary | ICD-10-CM

## 2012-05-10 MED ORDER — LETROZOLE 2.5 MG PO TABS: 2.5000 mg | ORAL_TABLET | Freq: Every day | ORAL | Status: DC

## 2012-05-10 NOTE — Progress Notes (Signed)
ID: Stacey [name]   DOB: 08-11-1940  MR#: [id number]  CSN#:[id number]  PCP: Stacey Barre, MD GYN:  Dr. Renato [name] SU:  Dr. Ezzard [name]  OTHER MD:  HISTORY OF PRESENT ILLNESS:  Stacey [name] is a 72 year old [place] woman, who has a history of breast cancer starting in 1997 and in 2013.  On 11/22/95, she underwent a left breast biopsy, which revealed malignant cells.  She had a left breast lumpectomy with re-excision on 11/29/95, with final pathology revealed IDC, grade 2, 2.5 cm, ER 98%, PR 97%, Ki67 8% with one of 15 nodes being positive.  She received chemotherapy, 4 cycles per patient, along with radiation and took Tamoxifen for 7 years following radiation completion.  Records from 1997 limited.  In October 2012, she had a biopsy of the right breast after mammography recommended additional images for a dense area in the right breast.  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  On 11/25/10, she had an MRI that measured the area in the right breast as 2.2 cm.  She started neoadjuvant [name] in November 2012 and has continued it since.  The area of concern measured 1.7 cm on a repeat MRI in March of 2013.  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  Surgical margins were negative with one of one sentinel lymph node positive for metastatic carcinoma.  She then underwent radiation therapy under the care of Dr. Kathrynn [name] from 10/19/11 to 12/03/11.  She has continued [name] since November of 2012 and has tolerated it well .  INTERVAL HISTORY:  She presents today for continued follow up.  She reports tolerating [name] well with no hot flashes, joint aches, or vaginal dryness.  She reports having fatigue after radiation that is similar to how  she felt in 1997 but she is "not bouncing back as fast."  She states that she used to walk 4 miles a day and now she is only able to walk one mile.  She reports intermittent shortness of breath on exertion.  She continues to smoke and has smoked for over 40 years.  She reports intermittent insomnia, often waking up at 3 or 4 o'clock and staying awake.  She reports taking an Ambien every night but when she wakes up, she is unable to fall back asleep.  Denies caffeine use in the evenings.  Her mother recently passed of pancreatic cancer.  She states that her hands stay cold and sometimes "turn a bluish color."  No other concerns voiced.   REVIEW OF SYSTEMS: Constitutional: Feels fatigued.  No fever, chills, or weakness.  Cardiovascular: Intermittent shortness of breath on exertion.  No chest pain or edema.  Pulmonary: No cough or wheeze.  Gastrointestinal: No nausea, vomiting, or diarrhea. No bright red blood per rectum or change in bowel movement.  Genitourinary: No frequency, urgency, or dysuria. No vaginal bleeding or discharge.  Musculoskeletal: No myalgia or joint pain. Neurologic: No weakness, numbness, or change in gait.  Psychology: Intermittent insomnia.  No depression or anxiety.  PAST MEDICAL HISTORY: Past Medical History  Diagnosis Date  . ANEMIA-NOS 12/17/2006  . ANXIETY 05/03/2009  . CONSTIPATION 03/28/2010  . COPD     resolved  . Cough 12/17/2006  . DIVERTICULOSIS, COLON 12/17/2006  .  FATIGUE 06/14/2007  . GERD 03/28/2010  . HYPERLIPIDEMIA 06/14/2007  . IRRITABLE BOWEL SYNDROME, HX OF 12/17/2006  . 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 Cancer     bilateral  . Breast cancer 1997    s/p chemo/xrt  . Hx of radiation therapy 10/19/11 -12/03/11    right breast    PAST SURGICAL HISTORY: Past Surgical History  Procedure Laterality Date  . Appendectomy    . Tonsillectomy    . Rectal fissure repair    . Abdominal hysterectomy    . Inguinal herniorrhapy  1984    left  . Tmj  arthroplasty  1989  . S/p lumpectomy  1997    melignant left x 2  . S/p benign breast biopsy  2003    right  . S/p left foot surgury  2009  . Spiral fx left foot  2008    no surgury  . Breast surgery      lumpectomy  . Breast biopsy  11/14/10     r breast: inv, insitu mammary carcinoma w/calcif, er/pr +, her2 -  . Hernia repair      FAMILY HISTORY Family History  Problem Relation Age of Onset  . Alcohol abuse Mother     ETOH dependence  . Hypertension Mother   . Stroke Mother   . Colon polyps Mother   . Cancer Mother     endometrial , lung and pancreatic cancer  . Diabetes Mother   . Pancreatic cancer Mother   . Lymphoma Brother     burkitts  . Cancer Brother     burkitts lymphoma [place]  . Breast cancer Cousin     1 maternal, 2 paternal  . Lymphoma Brother     bburketts    GYNECOLOGIC HISTORY:  She has two daughters.    SOCIAL HISTORY:  The patient lives alone and is divorced. She retired from [employer].  She continued to smoke and denies heavy alcohol use.   ADVANCED DIRECTIVES:  Living will  HEALTH MAINTENANCE: History  Substance Use Topics  . Smoking status: Current Some Day Smoker -- 0.25 packs/day for 40 years    Types: Cigarettes  . Smokeless tobacco: Not on file  . Alcohol Use: Yes     Comment: rare/ drinks socially     Colonoscopy:  Up to date per pt, reporting last in 2005  PAP:  Several years ago, has had hysterectomy but have both ovaries  Bone density:  02/15/11 resulting low bone mass, T-1.6  Lipid panel:  Managed by Dr. Jonny [name]  Allergies  Allergen Reactions  . Clindamycin/Lincomycin Diarrhea and Nausea And Vomiting  . Codeine Nausea And Vomiting    Current Outpatient Prescriptions  Medication Sig Dispense Refill  . aspirin 81 MG EC tablet Take 81 mg by mouth daily.        . calcium-vitamin D (OSCAL WITH D) 500-200 MG-UNIT per tablet Take 1 tablet by mouth daily.      . celecoxib (CELEBREX) 200 MG capsule Take 200 mg by mouth daily.       . cholecalciferol (VITAMIN D) 1000 UNITS tablet Take 1,000 Units by mouth daily.        . fluticasone (FLONASE) 50 MCG/ACT nasal spray instill 2 sprays into each nostril once daily  16 g  3  . letrozole ([name]) 2.5 MG tablet Take 1 tablet (2.5 mg total) by mouth daily.  30 tablet  11  . Multiple Vitamin (MULTIVITAMIN) capsule Take 1 capsule by mouth daily.        Marland Kitchen  omeprazole (PRILOSEC) 20 MG capsule Take 1 capsule (20 mg total) by mouth daily.  30 capsule  11  . polyethylene glycol (MIRALAX / GLYCOLAX) packet Take 17 g by mouth daily.      . pseudoephedrine (SUDAFED) 30 MG tablet Take 30 mg by mouth daily.       Marland Kitchen zolpidem (AMBIEN) 10 MG tablet take 1 tablet by mouth at bedtime if needed  30 tablet  5  . diphenhydrAMINE (BENADRYL) 25 MG tablet Take 25 mg by mouth every 6 (six) hours as needed.      Marland Kitchen emollient (BIAFINE) cream Apply topically 2 (two) times daily.      Marland Kitchen EPINEPHrine (EPIPEN) 0.3 mg/0.3 mL DEVI Use as directed       . hyaluronate sodium (RADIAPLEXRX) GEL Apply topically 2 (two) times daily.      . non-metallic deodorant Thornton Papas) MISC Apply 1 application topically daily as needed.      . polyethylene glycol powder (GLYCOLAX/MIRALAX) powder use as directed  527 g  8   No current facility-administered medications for this visit.    OBJECTIVE: Filed Vitals:   05/10/12 1505  BP: 146/78  Pulse: 106  Temp: 98.1 F (36.7 C)  Resp: 20     Body mass index is 25.15 kg/(m^2).    ECOG FS:  Symptomatic but completely ambulatory  General: Well developed, well nourished female in no acute distress. Alert and oriented x 3.  Repeat manual BP of 112/72.  Head/ Neck: Oropharynx clear.  Sclerae anicteric.  Supple without any enlargements.  Lymph node survey: No cervical, supraclavicular, or axillary adenopathy  Cardiovascular: Regular rate and rhythm. S1 and S2 normal.  Lungs: Clear to auscultation bilaterally. No wheezes/crackles/rhonchi noted.  Skin: No rashes or lesions present. Back:  No CVA tenderness.  Abdomen: Abdomen soft, non-tender and non-obese. Active bowel sounds in all quadrants. No evidence of a fluid wave or abdominal masses.  Breasts: Inspection negative with no nodularity, masses, erythema, or discharge noted bilaterally. Extremities: No bilateral cyanosis, edema, or clubbing.  -Unable to obtain a pulse ox reading on 2 different machines. Patient stating that she was 98% at the last doctor's visit.  LAB RESULTS: Lab Results  Component Value Date   WBC 6.2 02/02/2012   NEUTROABS 4.2 02/02/2012   HGB 12.8 02/02/2012   HCT 37.3 02/02/2012   MCV 90.0 02/02/2012   PLT 236 02/02/2012      Chemistry      Component Value Date/Time   NA 141 02/02/2012 0939   NA 138 08/28/2011 1540   NA 144 03/06/2011 0840   K 4.2 02/02/2012 0939   K 3.9 08/28/2011 1540   K 4.0 03/06/2011 0840   CL 107 02/02/2012 0939   CL 99 08/28/2011 1540   CL 106 03/06/2011 0840   CO2 26 02/02/2012 0939   CO2 30 08/28/2011 1540   CO2 28 03/06/2011 0840   BUN 17.0 02/02/2012 0939   BUN 18 08/28/2011 1540   BUN 17 03/06/2011 0840   CREATININE 0.8 02/02/2012 0939   CREATININE 0.75 08/28/2011 1540   CREATININE 0.8 03/06/2011 0840      Component Value Date/Time   CALCIUM 9.2 02/02/2012 0939   CALCIUM 9.7 08/28/2011 1540   CALCIUM 8.5 03/06/2011 0840   ALKPHOS 61 02/02/2012 0939   ALKPHOS 47 03/06/2011 0840   ALKPHOS 66 12/10/2010 0941   AST 23 02/02/2012 0939   AST 30 03/06/2011 0840   AST 22 12/10/2010 0941   ALT 16 02/02/2012  0939   ALT 18 12/10/2010 0941   BILITOT 0.26 02/02/2012 0939   BILITOT 0.40 03/06/2011 0840   BILITOT 0.2* 12/10/2010 0941       Lab Results  Component Value Date   LABCA2 56* 12/10/2010    No components found with this basename: [id number]    No results found for this basename: INR,  in the last 168 hours  Urinalysis    Component Value Date/Time   COLORURINE LT. YELLOW 09/24/2011 0955   APPEARANCEUR CLEAR 09/24/2011 0955   LABSPEC 1.010 09/24/2011 0955   PHURINE 7.0  09/24/2011 0955   HGBUR NEGATIVE 09/24/2011 0955   BILIRUBINUR NEGATIVE 09/24/2011 0955   KETONESUR NEGATIVE 09/24/2011 0955   UROBILINOGEN 0.2 09/24/2011 0955   NITRITE NEGATIVE 09/24/2011 0955   LEUKOCYTESUR NEGATIVE 09/24/2011 0955    STUDIES: No results found.  ASSESSMENT: 72 y.o. [place] woman: #1  S/P left breast lumpectomy with re-excision on 11/29/95 for a T2 N1bi IDC of the left breast, grade 2, Stage IIB, ER 98%, PR 97%, Ki67 8%.  #2  She underwent 4 cycles of AC followed by radiation therapy under the care of Dr. [name].  #3  She took Tamoxifen for a total of seven years per patient.  #4  S/P biopsy of the right breast on 11/14/10 resulting IMC with MCIS, grade 2, ER 85%, PR 57%, Ki67 20%, HER2 1.21.    #5  She started neoadjuvant [name] in November 2012 and has continued it since.  #6  S/P right lumpectomy with sentinel lymph node biopsy on 09/03/11 by Dr. Ezzard [name] for a ypT2, ypN1a invasive lobular carcinoma of the right breast, 2.5 cm, grade 2, Stage IIB, with one of one node positive.  #7  She underwent radiation therapy under the care of Dr. Kathrynn [name] from 10/19/11 to 12/03/11.   PLAN:  She is to continue taking [name] as prescribed.  She is advised to stop smoking.  She is to have her mammogram in October 2014 before her appointment with Dr. Darnelle Catalan.  She is advised to meet with the genetics counselor per Dr. Darnelle Catalan and an appointment will be arranged.  She is to have her bone density scan in January 2015.  She is advised call for any questions or concerns and to notify her primary care provider if the shortness of breath or fatigue persists.  The patient was reviewed with Dr. Darnelle Catalan, who spoke with the patient about future plans and recommendations.  Tryone Kille DEAL    05/11/2012

## 2012-05-10 NOTE — Telephone Encounter (Signed)
appt made and printed 

## 2012-05-10 NOTE — Patient Instructions (Addendum)
Doing well.  Continue taking [name] as prescribed.  Plan to have your mammogram in October 2014 before your appointment with Dr. Darnelle Catalan.  We will schedule you with the genetics counselor.  You will be scheduled to have your bone density scan in January 2015.  Please call for any questions or concerns.  Please notify your primary care provider if the shortness of breath or fatigue persists.

## 2012-06-17 DIAGNOSIS — H43819 Vitreous degeneration, unspecified eye: Secondary | ICD-10-CM | POA: Diagnosis not present

## 2012-06-17 DIAGNOSIS — H01009 Unspecified blepharitis unspecified eye, unspecified eyelid: Secondary | ICD-10-CM | POA: Diagnosis not present

## 2012-06-17 DIAGNOSIS — H40019 Open angle with borderline findings, low risk, unspecified eye: Secondary | ICD-10-CM | POA: Diagnosis not present

## 2012-06-17 DIAGNOSIS — H524 Presbyopia: Secondary | ICD-10-CM | POA: Diagnosis not present

## 2012-06-17 DIAGNOSIS — H353190 Nonexudative age-related macular degeneration, unspecified eye, stage unspecified: Secondary | ICD-10-CM | POA: Diagnosis not present

## 2012-06-17 DIAGNOSIS — H04129 Dry eye syndrome of unspecified lacrimal gland: Secondary | ICD-10-CM | POA: Diagnosis not present

## 2012-06-17 DIAGNOSIS — H2510 Age-related nuclear cataract, unspecified eye: Secondary | ICD-10-CM | POA: Diagnosis not present

## 2012-07-21 ENCOUNTER — Ambulatory Visit
Admission: RE | Admit: 2012-07-21 | Discharge: 2012-07-21 | Disposition: A | Discharge status: Home / Self Care | Payer: Medicare Other | Source: Ambulatory Visit | Attending: Radiation Oncology | Admitting: Radiation Oncology

## 2012-07-21 ENCOUNTER — Encounter: Payer: Self-pay | Admitting: Radiation Oncology

## 2012-07-21 VITALS — BP 137/56 | HR 96 | Temp 98.3°F | Wt 143.1 lb

## 2012-07-21 DIAGNOSIS — C50911 Malignant neoplasm of unspecified site of right female breast: Secondary | ICD-10-CM

## 2012-07-21 NOTE — Progress Notes (Signed)
Radiation Oncology         [phone] ________________________________  Name: Stacey [name] MRN: [id number]  Date: 07/21/2012  DOB: February 24, 1940  Follow-Up Visit Note  CC: Oliver Barre, MD  Kandis Cocking, MD  Diagnosis:   72 year old woman with stage T2 (2.5 cm) N1 (1/1) M0 ER positive invasive lobular carcinoma of the right breast-stage IIB s/p adjuvant treatment to the conserved right breast and regional lymph nodes 10/19/2011-12/03/2011 where the breast and regional lymph nodes were initially treated to 50.4 Gy in 28 fractions of 1.8 Gy and the surgical bed was boosted to 60.4 Gy with 5 additional fractions of 2 Gy  Interval Since Last Radiation:  8  months  Narrative:  The patient returns today for routine follow-up.  She is essentially without complaint.                              ALLERGIES:  is allergic to clindamycin/lincomycin and codeine.  Meds: Current Outpatient Prescriptions  Medication Sig Dispense Refill  . aspirin 81 MG EC tablet Take 81 mg by mouth daily.        . calcium-vitamin D (OSCAL WITH D) 500-200 MG-UNIT per tablet Take 1 tablet by mouth daily.      . celecoxib (CELEBREX) 200 MG capsule Take 200 mg by mouth daily.      . cholecalciferol (VITAMIN D) 1000 UNITS tablet Take 1,000 Units by mouth daily.        . diphenhydrAMINE (BENADRYL) 25 MG tablet Take 25 mg by mouth every 6 (six) hours as needed.      Marland Kitchen EPINEPHrine (EPIPEN) 0.3 mg/0.3 mL DEVI Use as directed       . fluticasone (FLONASE) 50 MCG/ACT nasal spray instill 2 sprays into each nostril once daily  16 g  3  . letrozole ([name]) 2.5 MG tablet Take 1 tablet (2.5 mg total) by mouth daily.  30 tablet  11  . Multiple Vitamin (MULTIVITAMIN) capsule Take 1 capsule by mouth daily.        Marland Kitchen omeprazole (PRILOSEC) 20 MG capsule Take 1 capsule (20 mg total) by mouth daily.  30 capsule  11  . polyethylene glycol (MIRALAX / GLYCOLAX) packet Take 17 g by mouth daily.      . pseudoephedrine (SUDAFED) 30 MG tablet Take  30 mg by mouth daily.       Marland Kitchen zolpidem (AMBIEN) 10 MG tablet take 1 tablet by mouth at bedtime if needed  30 tablet  5   No current facility-administered medications for this encounter.    Physical Findings: The patient is in no acute distress. Patient is alert and oriented.  weight is 143 lb 1.6 oz (64.91 kg). Her temperature is 98.3 F (36.8 C). Her blood pressure is 137/56 and her pulse is 96. Marland Kitchen  Her neck and supraclavicular region are free of adenopathy  lungs are clear  heart is regular  the breasts are symmetric on seated examination.  Supine, the lumpectomy site and axillary incision are well-healed with minimal subjacent fibrosis. There no dominant masses noted. The post treatment cosmetic result appears good.  Abdomen is soft  extremities are free of cyanosis clubbing or edema  neurologically patient is grossly intact. No significant changes.  Impression:  The patient has a good cosmetic result following breast conservation with no evidence of recurrent disease.  Plan:  She will undergo followup mammogram in October and continue to follow closely with  medical oncology. I would be happy to be involved in her care in the future if clinically indicated.  _____________________________________  Artist Pais. [name], M.D.

## 2012-07-21 NOTE — Progress Notes (Signed)
Patient here for 6 month follow up completion of radiation to right breast 11/2011.Denies pain.Skin has healed.Continued fatigue.Follow up  And mammogram scheduled with Dr.Magrinat November 21, 2012. Annual exam with primary care in August 2014.

## 2012-07-25 ENCOUNTER — Telehealth: Payer: Self-pay | Admitting: *Deleted

## 2012-07-25 NOTE — Telephone Encounter (Signed)
Message left by pt stating she would like to stop the femara and change to another drug due to ongoing side effects.  Return call number left as [phone].  Call returned and obtianed message stating " due to network difficulties call cannot be placed at this time ". Second attempt had same message.

## 2012-07-29 ENCOUNTER — Telehealth: Payer: Self-pay | Admitting: *Deleted

## 2012-08-19 ENCOUNTER — Encounter: Payer: Medicare Other | Admitting: Internal Medicine

## 2012-08-25 DIAGNOSIS — D239 Other benign neoplasm of skin, unspecified: Secondary | ICD-10-CM | POA: Diagnosis not present

## 2012-08-25 DIAGNOSIS — D1801 Hemangioma of skin and subcutaneous tissue: Secondary | ICD-10-CM | POA: Diagnosis not present

## 2012-08-25 DIAGNOSIS — D485 Neoplasm of uncertain behavior of skin: Secondary | ICD-10-CM | POA: Diagnosis not present

## 2012-08-25 DIAGNOSIS — L821 Other seborrheic keratosis: Secondary | ICD-10-CM | POA: Diagnosis not present

## 2012-08-25 DIAGNOSIS — L820 Inflamed seborrheic keratosis: Secondary | ICD-10-CM | POA: Diagnosis not present

## 2012-08-25 DIAGNOSIS — L819 Disorder of pigmentation, unspecified: Secondary | ICD-10-CM | POA: Diagnosis not present

## 2012-08-29 ENCOUNTER — Ambulatory Visit (HOSPITAL_BASED_OUTPATIENT_CLINIC_OR_DEPARTMENT_OTHER): Payer: Medicare Other | Admitting: Genetic Counselor

## 2012-08-29 ENCOUNTER — Other Ambulatory Visit: Payer: Medicare Other | Admitting: Lab

## 2012-08-29 ENCOUNTER — Encounter: Payer: Self-pay | Admitting: Genetic Counselor

## 2012-08-29 DIAGNOSIS — C50911 Malignant neoplasm of unspecified site of right female breast: Secondary | ICD-10-CM

## 2012-08-29 DIAGNOSIS — C50919 Malignant neoplasm of unspecified site of unspecified female breast: Secondary | ICD-10-CM

## 2012-08-29 DIAGNOSIS — D235 Other benign neoplasm of skin of trunk: Secondary | ICD-10-CM | POA: Diagnosis not present

## 2012-08-29 NOTE — Progress Notes (Signed)
Dr.  Raymond Gurney Magrinat requested a consultation for genetic counseling and risk assessment for Stacey [name], a 72 y.o. female, for discussion of her personal history of bilateral breast cancer and family history of burketts lymphoma, and breast, lung, pancreatic, uterine and "female" cancer.  She presents to clinic today to discuss the possibility of a genetic predisposition to cancer, and to further clarify her risks, as well as her family members' risks for cancer.   HISTORY OF PRESENT ILLNESS: In 1997, at the age of 11, Stacey [name] was diagnosed with invasive ductal carcinoma of the left breast. This was treated with chemotherapy, lumpectomy and radiation.  In 2012, at the age of 40, Stacey [name] was diagnosed with lobular breast cancer of the right breast.  This was treated with lumpectomy and radiation.      Past Medical History  Diagnosis Date  . ANEMIA-NOS 12/17/2006  . ANXIETY 05/03/2009  . CONSTIPATION 03/28/2010  . COPD     resolved  . Cough 12/17/2006  . DIVERTICULOSIS, COLON 12/17/2006  . FATIGUE 06/14/2007  . GERD 03/28/2010  . HYPERLIPIDEMIA 06/14/2007  . IRRITABLE BOWEL SYNDROME, HX OF 12/17/2006  . 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 Cancer     bilateral  . Breast cancer 1997    s/p chemo/xrt  . Hx of radiation therapy 10/19/11 -12/03/11    right breast    Past Surgical History  Procedure Laterality Date  . Appendectomy    . Tonsillectomy    . Rectal fissure repair    . Abdominal hysterectomy    . Inguinal herniorrhapy  1984    left  . Tmj arthroplasty  1989  . S/p lumpectomy  1997    melignant left x 2  . S/p benign breast biopsy  2003    right  . S/p left foot surgury  2009  . Spiral fx left foot  2008    no surgury  . Breast surgery      lumpectomy  . Breast biopsy  11/14/10     r breast: inv, insitu mammary carcinoma w/calcif, er/pr +, her2 -  . Hernia repair      History   Social History  . Marital Status: Divorced    Spouse Name: N/A    Number of  Children: 0  . Years of Education: N/A   Occupational History  . retired Training and development officer    Social History Main Topics  . Smoking status: Current Some Day Smoker -- 0.25 packs/day for 40 years    Types: Cigarettes  . Smokeless tobacco: None  . Alcohol Use: Yes     Comment: rare/ drinks socially  . Drug Use: No  . Sexually Active: None   Other Topics Concern  . None   Social History Narrative   Patient gets no regular exercise   No biological children   2 step children    REPRODUCTIVE HISTORY AND PERSONAL RISK ASSESSMENT FACTORS: Menarche was at age 70.   postmenopausal Uterus Intact: no Ovaries Intact: yes G1P0A1, first live birth at age N/A  She has not previously undergone treatment for infertility.   Oral Contraceptive use: 20 years   She has used HRT in the past.    FAMILY HISTORY:  We obtained a detailed, 4-generation family history.  Significant diagnoses are listed below: Family History  Problem Relation Age of Onset  . Alcohol abuse Mother     ETOH dependence  . Hypertension Mother   . Stroke Mother   . Colon polyps  Mother   . Diabetes Mother   . Pancreatic cancer Mother 2  . Uterine cancer Mother 45  . Lymphoma Brother     burkitts  . Breast cancer Cousin     1 maternal, 2 paternal  . Lung cancer Paternal Uncle   . Lung cancer Maternal Grandmother 66    non-smoker  . Lung cancer Maternal Grandfather   . Breast cancer Cousin     maternal cousin, dx in her mid 59s  . Brain cancer Cousin     maternal cousin's son; dx in his 39s  . Testicular cancer Cousin     maternal cousin's son;   . Breast cancer Cousin     paternal cousin; dx in her 56s  The patient's maternal grandmother's sister died of a "female cancer" at age 12, and her maternal grandfather's sister was diagnosed with a "female cancer" and died at age 68.  Patient's maternal ancestors are of [place] descent, and paternal ancestors are of [place] descent. There is no reported Ashkenazi Jewish  ancestry. There is no  known consanguinity.  GENETIC COUNSELING ASSESSMENT: Stacey [name] is a 72 y.o. female with a personal history of bilateral breast cancer which somewhat suggestive of a hereditary breast cancer syndrome and predisposition to cancer. We, therefore, discussed and recommended the following at today's visit.   DISCUSSION: We reviewed the characteristics, features and inheritance patterns of hereditary cancer syndromes. We also discussed genetic testing, including the appropriate family members to test, the process of testing, insurance coverage and turn-around-time for results. We recommended Stacey [name] pursue genetic testing for Breast/Ovarian cancer panel at [facility].   PLAN: After considering the risks, benefits, and limitations, Stacey [name] to pursue genetic testing and the blood sample will be sent to [facility] for analysis of the Breast/Ovarian cancer panel. We discussed the implications of a positive, negative and/ or variant of uncertain significance genetic test result. Results should be available within approximately 3-4 weeks' time, at which point they will be disclosed by telephone to Stacey [name], as will any additional her recommendations warranted by these results. Stacey [name] will receive a summary of her genetic counseling visit and a copy of her results once available. This information will also be available in Epic. We encouraged Stacey [name] to remain in contact with cancer genetics annually so that we can continuously update the family history and inform her of any changes in cancer genetics and testing that may be of benefit for her family. Stacey [name] were answered to her satisfaction today. Our contact information was provided should additional [name] or concerns arise.   Per the patient's request, we will contact her by telephone to discuss these results. A follow up genetic counseling visit will be  scheduled if indicated.  The patient was seen for a total of 60 minutes, greater than 50% of which was spent face-to-face counseling.  This plan is being carried out per Dr. Feliz Beam recommendations.  This note will also be sent to the referring provider via the electronic medical record. The patient will be supplied with a summary of this genetic counseling discussion as well as educational information on the discussed hereditary cancer syndromes following the conclusion of their visit.   Patient was discussed with Dr. Drue Second.   _______________________________________________________________________ For Office Staff:  Number of people involved in session: 1 Was an Intern/ student involved with case: no

## 2012-08-30 ENCOUNTER — Telehealth: Payer: Self-pay | Admitting: Genetic Counselor

## 2012-08-30 NOTE — Telephone Encounter (Signed)
Told Stacey [name] Stacey [name] that Stacey [name] wants to cancel the test.  She will fax me a cancellation form to sign and fax back.

## 2012-08-30 NOTE — Telephone Encounter (Signed)
[facility] called and indicated that the patient did not fully meet the medical criteria for testing.  I called the patient and she does not want to continue with testing.  The test will be cancelled.

## 2012-09-07 ENCOUNTER — Telehealth: Payer: Self-pay | Admitting: Emergency Medicine

## 2012-09-07 NOTE — Telephone Encounter (Signed)
Patient called stating that she stopped the [name] on August 09, 2012 due to increased fatigue. States that since she stopped the [name] she been less fatigued and has reported to have recently taken a trip to [place] alone and drove back home on the same day; which she states she wouldn't have been able to do prior to stopping the [name]. Patient to see Dr Darnelle Catalan on 11/28/12 for follow up. Will discuss the above with him and call patient with further instructions. Patient knows to keep all future appointments unless otherwise instructed.

## 2012-09-12 ENCOUNTER — Ambulatory Visit (INDEPENDENT_AMBULATORY_CARE_PROVIDER_SITE_OTHER): Payer: Medicare Other | Admitting: Internal Medicine

## 2012-09-12 ENCOUNTER — Other Ambulatory Visit (INDEPENDENT_AMBULATORY_CARE_PROVIDER_SITE_OTHER): Payer: Medicare Other

## 2012-09-12 ENCOUNTER — Encounter: Payer: Self-pay | Admitting: Internal Medicine

## 2012-09-12 ENCOUNTER — Ambulatory Visit (INDEPENDENT_AMBULATORY_CARE_PROVIDER_SITE_OTHER)
Admission: RE | Admit: 2012-09-12 | Discharge: 2012-09-12 | Disposition: A | Discharge status: Home / Self Care | Payer: Medicare Other | Source: Ambulatory Visit | Attending: Internal Medicine | Admitting: Internal Medicine

## 2012-09-12 VITALS — BP 120/70 | HR 82 | Temp 98.1°F | Ht 63.0 in | Wt 146.0 lb

## 2012-09-12 DIAGNOSIS — M545 Low back pain, unspecified: Secondary | ICD-10-CM

## 2012-09-12 DIAGNOSIS — R5381 Other malaise: Secondary | ICD-10-CM | POA: Diagnosis not present

## 2012-09-12 DIAGNOSIS — R252 Cramp and spasm: Secondary | ICD-10-CM

## 2012-09-12 DIAGNOSIS — R5383 Other fatigue: Secondary | ICD-10-CM | POA: Diagnosis not present

## 2012-09-12 DIAGNOSIS — M25551 Pain in right hip: Secondary | ICD-10-CM | POA: Insufficient documentation

## 2012-09-12 DIAGNOSIS — M25559 Pain in unspecified hip: Secondary | ICD-10-CM | POA: Diagnosis not present

## 2012-09-12 DIAGNOSIS — E785 Hyperlipidemia, unspecified: Secondary | ICD-10-CM

## 2012-09-12 DIAGNOSIS — M5137 Other intervertebral disc degeneration, lumbosacral region: Secondary | ICD-10-CM | POA: Diagnosis not present

## 2012-09-12 LAB — HEPATIC FUNCTION PANEL
ALT: 20 U/L (ref 0–35)
AST: 24 U/L (ref 0–37)
Alkaline Phosphatase: 62 U/L (ref 39–117)
Bilirubin, Direct: 0 mg/dL (ref 0.0–0.3)
Total Protein: 7.4 g/dL (ref 6.0–8.3)

## 2012-09-12 LAB — URINALYSIS, ROUTINE W REFLEX MICROSCOPIC
Hgb urine dipstick: NEGATIVE
Urine Glucose: NEGATIVE
Urobilinogen, UA: 0.2 (ref 0.0–1.0)

## 2012-09-12 LAB — CBC WITH DIFFERENTIAL/PLATELET
Basophils Absolute: 0 10*3/uL (ref 0.0–0.1)
Eosinophils Absolute: 0.2 10*3/uL (ref 0.0–0.7)
Hemoglobin: 13.6 g/dL (ref 12.0–15.0)
Lymphocytes Relative: 20 % (ref 12.0–46.0)
MCHC: 33.7 g/dL (ref 30.0–36.0)
Neutro Abs: 6.2 10*3/uL (ref 1.4–7.7)
Neutrophils Relative %: 71.1 % (ref 43.0–77.0)
Platelets: 344 10*3/uL (ref 150.0–400.0)
RDW: 13.4 % (ref 11.5–14.6)

## 2012-09-12 LAB — BASIC METABOLIC PANEL
CO2: 29 mEq/L (ref 19–32)
Chloride: 101 mEq/L (ref 96–112)
Potassium: 4.3 mEq/L (ref 3.5–5.1)
Sodium: 138 mEq/L (ref 135–145)

## 2012-09-12 LAB — LIPID PANEL: Total CHOL/HDL Ratio: 3

## 2012-09-12 NOTE — Assessment & Plan Note (Signed)
stable overall by history and exam, recent data reviewed with pt, and pt to continue medical treatment as before,  to f/u any worsening symptoms or concerns Lab Results  Component Value Date   LDLCALC 84 07/04/2009

## 2012-09-12 NOTE — Assessment & Plan Note (Signed)
Etiology unclear, Exam otherwise benign, to check labs as documented, follow with expectant management  Note:  Total time for pt hx, exam, review of record with pt in the room, determination of diagnoses and plan for further eval and tx is > 40 min, with over 50% spent in coordination and counseling of patient  

## 2012-09-12 NOTE — Patient Instructions (Signed)
Please continue all other medications as before, as there are no new medications at this time Please have the pharmacy call with any other refills you may need. Please continue your efforts at being more active, low cholesterol diet, and weight control. You are otherwise up to date with prevention measures today. Please go to the XRAY Department in the Basement (go straight as you get off the elevator) for the x-ray testing Please go to the LAB in the Basement (turn left off the elevator) for the tests to be done today You will be contacted by phone if any changes need to be made immediately.  Otherwise, you will receive a letter about your results with an explanation, but please check with MyChart first.  Please remember to sign up for My Chart if you have not done so, as this will be important to you in the future with finding out test results, communicating by private email, and scheduling acute appointments online when needed.  Please return in 1 year for your yearly visit, or sooner if needed

## 2012-09-12 NOTE — Progress Notes (Signed)
Subjective:    Patient ID: Stacey [name], female    DOB: 12-11-1940, 72 y.o.   MRN: [id number]  HPI  Here for yearly f/u;  Overall doing ok;  Pt denies CP, worsening SOB, DOE, wheezing, orthopnea, PND, worsening LE edema, palpitations, dizziness or syncope.  Pt denies neurological change such as new headache, facial or extremity weakness.  Pt denies polydipsia, polyuria, or low sugar symptoms. Pt states overall good compliance with treatment and medications, good tolerability, and has been trying to follow lower cholesterol diet.  Pt denies worsening depressive symptoms, suicidal ideation or panic. No fever, night sweats, wt loss, loss of appetite, or other constitutional symptoms.  Pt states good ability with ADL's, has low fall risk, home safety reviewed and adequate, no other significant changes in hearing or vision, and Walks 3-4 miles daily, less with recent breast surgury and pain di not improve.  C/o rigtht lateral hip pain, worse to drive, but not clearly o/w radicular, no leg giveaway or falls. Pt continues to have recurring LBP without change in severity, bowel or bladder change, fever, wt loss,  worsening LE pain/numbness/weakness, gait change or falls. Does c/o ongoing fatigue, but denies signficant daytime hypersomnolence, and occasional muscle cramps to feet  Past Medical History  Diagnosis Date  . ANEMIA-NOS 12/17/2006  . ANXIETY 05/03/2009  . CONSTIPATION 03/28/2010  . COPD     resolved  . Cough 12/17/2006  . DIVERTICULOSIS, COLON 12/17/2006  . FATIGUE 06/14/2007  . GERD 03/28/2010  . HYPERLIPIDEMIA 06/14/2007  . IRRITABLE BOWEL SYNDROME, HX OF 12/17/2006  . 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 Cancer     bilateral  . Breast cancer 1997    s/p chemo/xrt  . Hx of radiation therapy 10/19/11 -12/03/11    right breast   Past Surgical History  Procedure Laterality Date  . Appendectomy    . Tonsillectomy    . Rectal fissure repair    . Abdominal hysterectomy    . Inguinal herniorrhapy   1984    left  . Tmj arthroplasty  1989  . S/p lumpectomy  1997    melignant left x 2  . S/p benign breast biopsy  2003    right  . S/p left foot surgury  2009  . Spiral fx left foot  2008    no surgury  . Breast surgery      lumpectomy  . Breast biopsy  11/14/10     r breast: inv, insitu mammary carcinoma w/calcif, er/pr +, her2 -  . Hernia repair      reports that she has been smoking Cigarettes.  She has a 10 pack-year smoking history. She does not have any smokeless tobacco history on file. She reports that  drinks alcohol. She reports that she does not use illicit drugs. family history includes Alcohol abuse in her mother; Brain cancer in her cousin; Breast cancer in her cousins; Colon polyps in her mother; Diabetes in her mother; Hypertension in her mother; Lung cancer in her maternal grandfather and paternal uncle; Lung cancer (age of onset: 84) in her maternal grandmother; Lymphoma in her brother; Pancreatic cancer (age of onset: 26) in her mother; Stroke in her mother; Testicular cancer in her cousin; and Uterine cancer (age of onset: 95) in her mother. Allergies  Allergen Reactions  . Clindamycin/Lincomycin Diarrhea and Nausea And Vomiting  . Codeine Nausea And Vomiting   Current Outpatient Prescriptions on File Prior to Visit  Medication Sig Dispense Refill  . aspirin 81 MG EC  tablet Take 81 mg by mouth daily.        . calcium-vitamin D (OSCAL WITH D) 500-200 MG-UNIT per tablet Take 1 tablet by mouth daily.      . celecoxib (CELEBREX) 200 MG capsule Take 200 mg by mouth daily.      . cholecalciferol (VITAMIN D) 1000 UNITS tablet Take 1,000 Units by mouth daily.        . diphenhydrAMINE (BENADRYL) 25 MG tablet Take 25 mg by mouth every 6 (six) hours as needed.      Marland Kitchen EPINEPHrine (EPIPEN) 0.3 mg/0.3 mL DEVI Use as directed       . fluticasone (FLONASE) 50 MCG/ACT nasal spray instill 2 sprays into each nostril once daily  16 g  3  . Multiple Vitamin (MULTIVITAMIN) capsule Take  1 capsule by mouth daily.        Marland Kitchen omeprazole (PRILOSEC) 20 MG capsule Take 1 capsule (20 mg total) by mouth daily.  30 capsule  11  . polyethylene glycol (MIRALAX / GLYCOLAX) packet Take 17 g by mouth daily.      . pseudoephedrine (SUDAFED) 30 MG tablet Take 30 mg by mouth daily.       Marland Kitchen zolpidem (AMBIEN) 10 MG tablet take 1 tablet by mouth at bedtime if needed  30 tablet  5  . letrozole ([name]) 2.5 MG tablet Take 1 tablet (2.5 mg total) by mouth daily.  30 tablet  11   No current facility-administered medications on file prior to visit.   Review of Systems Constitutional: Negative for diaphoresis, activity change, appetite change or unexpected weight change.  HENT: Negative for hearing loss, ear pain, facial swelling, mouth sores and neck stiffness.   Eyes: Negative for pain, redness and visual disturbance.  Respiratory: Negative for shortness of breath and wheezing.   Cardiovascular: Negative for chest pain and palpitations.  Gastrointestinal: Negative for diarrhea, blood in stool, abdominal distention or other pain Genitourinary: Negative for hematuria, flank pain or change in urine volume.  Musculoskeletal: Negative for myalgias and joint swelling.  Skin: Negative for color change and wound.  Neurological: Negative for syncope and numbness. other than noted Hematological: Negative for adenopathy.  Psychiatric/Behavioral: Negative for hallucinations, self-injury, decreased concentration and agitation.      Objective:   Physical Exam BP 120/70  Pulse 82  Temp(Src) 98.1 F (36.7 C) (Oral)  Ht 5\' 3"  (1.6 m)  Wt 146 lb (66.225 kg)  BMI 25.87 kg/m2  SpO2 94% VS noted,  Constitutional: Pt is oriented to person, place, and time. Appears well-developed and well-nourished.  Head: Normocephalic and atraumatic.  Right Ear: External ear normal.  Left Ear: External ear normal.  Nose: Nose normal.  Mouth/Throat: Oropharynx is clear and moist.  Eyes: Conjunctivae and EOM are normal.  Pupils are equal, round, and reactive to light.  Neck: Normal range of motion. Neck supple. No JVD present. No tracheal deviation present.  Cardiovascular: Normal rate, regular rhythm, normal heart sounds and intact distal pulses.   Pulmonary/Chest: Effort normal and breath sounds normal.  Abdominal: Soft. Bowel sounds are normal. There is no tenderness. No HSM  Musculoskeletal: Normal range of motion. Exhibits no edema.  Lymphadenopathy:  Has no cervical adenopathy.  Neurological: Pt is alert and oriented to person, place, and time. Pt has normal reflexes. No cranial nerve deficit.  Skin: Skin is warm and dry. No rash noted.  Psychiatric:  Has  normal mood and affect. Behavior is normal.  Right lateral hip NT, spine and leg  o/w NT No pain elicited with bilat hip flexion, int/ext rotation    Assessment & Plan:

## 2012-09-12 NOTE — Assessment & Plan Note (Signed)
Etiology unclear, for right hip and LS spine films, refer ortho

## 2012-09-12 NOTE — Assessment & Plan Note (Signed)
Also for Mg level,  to f/u any worsening symptoms or concerns

## 2012-09-12 NOTE — Assessment & Plan Note (Signed)
stable overall by history and exam, , and pt to continue medical treatment as before,  to f/u any worsening symptoms or concerns  

## 2012-09-13 ENCOUNTER — Telehealth: Payer: Self-pay | Admitting: Internal Medicine

## 2012-09-13 MED ORDER — CLOTRIMAZOLE-BETAMETHASONE 1-0.05 % EX CREA: TOPICAL_CREAM | CUTANEOUS | Status: DC

## 2012-09-13 NOTE — Telephone Encounter (Signed)
rx done erx 

## 2012-09-13 NOTE — Telephone Encounter (Signed)
Message copied by [name] on Tue Sep 13, 2012  1:03 PM ------      Message from: Scharlene Gloss B      Created: Mon Sep 12, 2012  4:08 PM       The patient would like a prescription for Lotrisone cream sent to [facility].  She uses for dry areas around her lips. ------

## 2012-09-15 DIAGNOSIS — M76899 Other specified enthesopathies of unspecified lower limb, excluding foot: Secondary | ICD-10-CM | POA: Diagnosis not present

## 2012-09-24 ENCOUNTER — Other Ambulatory Visit: Payer: Self-pay | Admitting: Internal Medicine

## 2012-10-06 DIAGNOSIS — M76899 Other specified enthesopathies of unspecified lower limb, excluding foot: Secondary | ICD-10-CM | POA: Diagnosis not present

## 2012-10-09 ENCOUNTER — Other Ambulatory Visit: Payer: Self-pay | Admitting: Internal Medicine

## 2012-11-18 DIAGNOSIS — Z1231 Encounter for screening mammogram for malignant neoplasm of breast: Secondary | ICD-10-CM | POA: Diagnosis not present

## 2012-11-21 ENCOUNTER — Other Ambulatory Visit (HOSPITAL_BASED_OUTPATIENT_CLINIC_OR_DEPARTMENT_OTHER): Payer: Medicare Other

## 2012-11-21 DIAGNOSIS — C50919 Malignant neoplasm of unspecified site of unspecified female breast: Secondary | ICD-10-CM

## 2012-11-21 DIAGNOSIS — M8580 Other specified disorders of bone density and structure, unspecified site: Secondary | ICD-10-CM

## 2012-11-21 LAB — CBC WITH DIFFERENTIAL/PLATELET
Basophils Absolute: 0 10*3/uL (ref 0.0–0.1)
Eosinophils Absolute: 0.2 10*3/uL (ref 0.0–0.5)
HGB: 12.4 g/dL (ref 11.6–15.9)
LYMPH%: 18.9 % (ref 14.0–49.7)
MCV: 87.8 fL (ref 79.5–101.0)
MONO%: 7.4 % (ref 0.0–14.0)
NEUT#: 5 10*3/uL (ref 1.5–6.5)
NEUT%: 70.5 % (ref 38.4–76.8)
Platelets: 286 10*3/uL (ref 145–400)
RBC: 4.2 10*6/uL (ref 3.70–5.45)

## 2012-11-21 LAB — COMPREHENSIVE METABOLIC PANEL (CC13)
ALT: 21 U/L (ref 0–55)
AST: 27 U/L (ref 5–34)
Albumin: 3.6 g/dL (ref 3.5–5.0)
Anion Gap: 10 mEq/L (ref 3–11)
CO2: 23 mEq/L (ref 22–29)
Calcium: 9.8 mg/dL (ref 8.4–10.4)
Chloride: 110 mEq/L — ABNORMAL HIGH (ref 98–109)
Creatinine: 0.7 mg/dL (ref 0.6–1.1)
Total Protein: 7 g/dL (ref 6.4–8.3)

## 2012-11-28 ENCOUNTER — Other Ambulatory Visit: Payer: Self-pay | Admitting: Oncology

## 2012-11-28 ENCOUNTER — Telehealth: Payer: Self-pay | Admitting: *Deleted

## 2012-11-28 ENCOUNTER — Ambulatory Visit (HOSPITAL_BASED_OUTPATIENT_CLINIC_OR_DEPARTMENT_OTHER): Payer: Medicare Other | Admitting: Oncology

## 2012-11-28 VITALS — BP 128/77 | HR 78 | Temp 98.0°F | Resp 18 | Ht 63.0 in | Wt 152.8 lb

## 2012-11-28 DIAGNOSIS — C50219 Malignant neoplasm of upper-inner quadrant of unspecified female breast: Secondary | ICD-10-CM

## 2012-11-28 DIAGNOSIS — C50919 Malignant neoplasm of unspecified site of unspecified female breast: Secondary | ICD-10-CM

## 2012-11-28 DIAGNOSIS — C773 Secondary and unspecified malignant neoplasm of axilla and upper limb lymph nodes: Secondary | ICD-10-CM

## 2012-11-28 DIAGNOSIS — G479 Sleep disorder, unspecified: Secondary | ICD-10-CM

## 2012-11-28 DIAGNOSIS — Z853 Personal history of malignant neoplasm of breast: Secondary | ICD-10-CM | POA: Diagnosis not present

## 2012-11-28 DIAGNOSIS — M899 Disorder of bone, unspecified: Secondary | ICD-10-CM | POA: Diagnosis not present

## 2012-11-28 DIAGNOSIS — R5381 Other malaise: Secondary | ICD-10-CM

## 2012-11-28 DIAGNOSIS — M8580 Other specified disorders of bone density and structure, unspecified site: Secondary | ICD-10-CM

## 2012-11-28 DIAGNOSIS — R5383 Other fatigue: Secondary | ICD-10-CM

## 2012-11-28 DIAGNOSIS — C50211 Malignant neoplasm of upper-inner quadrant of right female breast: Secondary | ICD-10-CM | POA: Insufficient documentation

## 2012-11-28 MED ORDER — LETROZOLE 2.5 MG PO TABS: 2.5000 mg | ORAL_TABLET | Freq: Every day | ORAL | Status: DC

## 2012-11-28 NOTE — Progress Notes (Signed)
ID: [name]   DOB: Mar 21, 1940  MR#: [id number]  EXB#:[id number]  PCP: Stacey Barre, MD GYN:  Dr. Renato [name] SU:  Dr. Ezzard [name]  OTHER MD:  HISTORY OF PRESENT ILLNESS:  Stacey [name] is a 72 year old [place] woman, who has a history of breast cancer starting in 1997 and in 2013.    On 11/22/95, she underwent a left breast biopsy, which revealed malignant cells.  She had a left breast lumpectomy with re-excision on 11/29/95, with final pathology revealed IDC, grade 2, 2.5 cm, ER 98%, PR 97%, Ki67 8% with one of 15 nodes being positive.  She received chemotherapy, 4 cycles per patient, along with radiation and took Tamoxifen for 7 years following radiation completion.  Records from 1997 limited.    In October 2012, she had a biopsy of the right breast after mammography recommended additional images for a dense area in the right breast.  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  On 11/25/10, she had an MRI that measured the area in the right breast as 2.2 cm.  She started neoadjuvant [name] in November 2012 and has continued it since.  The area of concern measured 1.7 cm on a repeat MRI in March of 2013.  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  Surgical margins were negative with one of one sentinel lymph node positive for metastatic carcinoma.  She then underwent radiation therapy under the care of Dr. Kathrynn [name] from 10/19/11 to 12/03/11.  She has continued [name] since November of 2012 and has tolerated it well .  INTERVAL HISTORY:   Stacey [name] returns today for followup of her breast cancer. The interval history is significant for her having met with our genetics counselor, and she agreed that she deserved testing for the BRCA  genes, but her insurance did not agree. Accordingly the testing had to be canceled.  Also since her last visit here, Stacey [name] stopped her letrozole because she thought it was the cause of her fatigue and hair loss. 3 months later she is just is fatigued and her hair loss is not better. Accordingly she is willing to go back to letrozole at this point.  REVIEW OF SYSTEMS: She is tolerating the letrozole well and in particular she does not have significant problems with vaginal dryness or hot flashes, although she.does have some of both. The biggest problem she is having is fatigued. She does take walks, always at least 3 miles, most days, but then the rest of the day she feels tired, and sometimes she feels like she has to take a nap. Then she cannot sleep at night. She goes to bed about 1 in the morning, gets up somewhere between 9 and 10 in the morning and gets up in between. She tells me this not hot flashes ordered nocturia that wakes her up. Aside from these issues and she has mild urinary stress incontinence and some arthritis pain, which was recently evaluated by orthopedics. A detailed review systems today was otherwise noncontributory  PAST MEDICAL HISTORY: Past Medical History  Diagnosis Date  . ANEMIA-NOS 12/17/2006  . ANXIETY 05/03/2009  . CONSTIPATION 03/28/2010  . COPD     resolved  . Cough 12/17/2006  . DIVERTICULOSIS, COLON 12/17/2006  . FATIGUE  06/14/2007  . GERD 03/28/2010  . HYPERLIPIDEMIA 06/14/2007  . IRRITABLE BOWEL SYNDROME, HX OF 12/17/2006  . 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 Cancer     bilateral  . Breast cancer 1997    s/p chemo/xrt  . Hx of radiation therapy 10/19/11 -12/03/11    right breast    PAST SURGICAL HISTORY: Past Surgical History  Procedure Laterality Date  . Appendectomy    . Tonsillectomy    . Rectal fissure repair    . Abdominal hysterectomy    . Inguinal herniorrhapy  1984    left  . Tmj arthroplasty  1989  . S/p lumpectomy  1997    melignant left  x 2  . S/p benign breast biopsy  2003    right  . S/p left foot surgury  2009  . Spiral fx left foot  2008    no surgury  . Breast surgery      lumpectomy  . Breast biopsy  11/14/10     r breast: inv, insitu mammary carcinoma w/calcif, er/pr +, her2 -  . Hernia repair      FAMILY HISTORY Family History  Problem Relation Age of Onset  . Alcohol abuse Mother     ETOH dependence  . Hypertension Mother   . Stroke Mother   . Colon polyps Mother   . Diabetes Mother   . Pancreatic cancer Mother 87  . Uterine cancer Mother 5  . Lymphoma Brother     burkitts  . Breast cancer Cousin     1 maternal, 2 paternal  . Lung cancer Paternal Uncle   . Lung cancer Maternal Grandmother 54    non-smoker  . Lung cancer Maternal Grandfather   . Breast cancer Cousin     maternal cousin, dx in her mid 73s  . Brain cancer Cousin     maternal cousin's son; dx in his 96s  . Testicular cancer Cousin     maternal cousin's son;   . Breast cancer Cousin     paternal cousin; dx in her 38s    GYNECOLOGIC HISTORY:  She has two daughters.    SOCIAL HISTORY:  The patient lives alone and is divorced. She retired from [employer].  She continued to smoke and denies heavy alcohol use.   ADVANCED DIRECTIVES:  Living will  HEALTH MAINTENANCE: History  Substance Use Topics  . Smoking status: Current Some Day Smoker -- 0.25 packs/day for 40 years    Types: Cigarettes  . Smokeless tobacco: Not on file  . Alcohol Use: Yes     Comment: rare/ drinks socially     Colonoscopy:  Up to date per pt, reporting last in 2005  PAP:  Several years ago, has had hysterectomy but have both ovaries  Bone density:  02/15/11 resulting low bone mass, T-1.6  Lipid panel:  Managed by Dr. Jonny [name]  Allergies  Allergen Reactions  . Clindamycin/Lincomycin Diarrhea and Nausea And Vomiting  . Codeine Nausea And Vomiting    Current Outpatient Prescriptions  Medication Sig Dispense Refill  . aspirin 81 MG EC tablet Take 81 mg  by mouth daily.        . calcium-vitamin D (OSCAL WITH D) 500-200 MG-UNIT per tablet Take 1 tablet by mouth daily.      . CELEBREX 200 MG capsule take 1 capsule by mouth twice a day if needed for pain  60 capsule  3  . celecoxib (CELEBREX) 200 MG capsule Take 200 mg by mouth daily.      Marland Kitchen  cholecalciferol (VITAMIN D) 1000 UNITS tablet Take 1,000 Units by mouth daily.        . clotrimazole-betamethasone (LOTRISONE) cream Use as directed twice per day as needed  15 g  1  . diphenhydrAMINE (BENADRYL) 25 MG tablet Take 25 mg by mouth every 6 (six) hours as needed.      Marland Kitchen EPINEPHrine (EPIPEN) 0.3 mg/0.3 mL DEVI Use as directed       . fluticasone (FLONASE) 50 MCG/ACT nasal spray instill 2 sprays into each nostril once daily  16 g  3  . letrozole ([name]) 2.5 MG tablet Take 1 tablet (2.5 mg total) by mouth daily.  30 tablet  11  . Multiple Vitamin (MULTIVITAMIN) capsule Take 1 capsule by mouth daily.        Marland Kitchen omeprazole (PRILOSEC) 20 MG capsule take 1 capsule by mouth once daily  30 capsule  11  . polyethylene glycol (MIRALAX / GLYCOLAX) packet Take 17 g by mouth daily.      . pseudoephedrine (SUDAFED) 30 MG tablet Take 30 mg by mouth daily.       Marland Kitchen zolpidem (AMBIEN) 10 MG tablet take 1 tablet by mouth at bedtime if needed  30 tablet  5   No current facility-administered medications for this visit.    OBJECTIVE: Older white woman who appears well Filed Vitals:   11/28/12 1404  BP: 128/77  Pulse: 78  Temp: 98 F (36.7 C)  Resp: 18     Body mass index is 27.07 kg/(m^2).    ECOG FS:  1  Sclerae unicteric, pupils equal round and reactive to light Oropharynx no thrush or other lesions No cervical or supraclavicular adenopathy Lungs no rales or rhonchi Heart regular rate and rhythm Abd soft, nontender, positive bowel sounds MSK no focal spinal tenderness, no upper extremity edema Neuro: nonfocal, well-oriented Breasts: Both breasts are status post lumpectomy and radiation. There is no  evidence of local recurrence. Both axillae are benign  -Unable to obtain a pulse ox reading on 2 different machines. Patient stating that she was 98% at the last doctor's visit.  LAB RESULTS: Lab Results  Component Value Date   WBC 7.1 11/21/2012   NEUTROABS 5.0 11/21/2012   HGB 12.4 11/21/2012   HCT 36.9 11/21/2012   MCV 87.8 11/21/2012   PLT 286 11/21/2012      Chemistry      Component Value Date/Time   NA 142 11/21/2012 1403   NA 138 09/12/2012 1612   NA 144 03/06/2011 0840   K 3.9 11/21/2012 1403   K 4.3 09/12/2012 1612   K 4.0 03/06/2011 0840   CL 101 09/12/2012 1612   CL 107 02/02/2012 0939   CL 106 03/06/2011 0840   CO2 23 11/21/2012 1403   CO2 29 09/12/2012 1612   CO2 28 03/06/2011 0840   BUN 16.2 11/21/2012 1403   BUN 17 09/12/2012 1612   BUN 17 03/06/2011 0840   CREATININE 0.7 11/21/2012 1403   CREATININE 0.8 09/12/2012 1612   CREATININE 0.8 03/06/2011 0840      Component Value Date/Time   CALCIUM 9.8 11/21/2012 1403   CALCIUM 9.9 09/12/2012 1612   CALCIUM 8.5 03/06/2011 0840   ALKPHOS 68 11/21/2012 1403   ALKPHOS 62 09/12/2012 1612   ALKPHOS 47 03/06/2011 0840   AST 27 11/21/2012 1403   AST 24 09/12/2012 1612   AST 30 03/06/2011 0840   ALT 21 11/21/2012 1403   ALT 20 09/12/2012 1612   ALT 26 03/06/2011  0840   BILITOT 0.46 11/21/2012 1403   BILITOT 0.5 09/12/2012 1612   BILITOT 0.40 03/06/2011 0840       Lab Results  Component Value Date   LABCA2 56* 12/10/2010    No components found with this basename: LABCA125    No results found for this basename: INR,  in the last 168 hours  Urinalysis    Component Value Date/Time   COLORURINE LT. YELLOW 09/12/2012 1612   APPEARANCEUR CLEAR 09/12/2012 1612   LABSPEC 1.010 09/12/2012 1612   PHURINE 7.0 09/12/2012 1612   HGBUR NEGATIVE 09/12/2012 1612   BILIRUBINUR NEGATIVE 09/12/2012 1612   KETONESUR NEGATIVE 09/12/2012 1612   UROBILINOGEN 0.2 09/12/2012 1612   NITRITE NEGATIVE 09/12/2012 1612   LEUKOCYTESUR NEGATIVE 09/12/2012 1612     STUDIES: Bilateral mammography at [facility] 11/19/2022 unremarkable.  ASSESSMENT: 72 y.o.  woman: #1  S/P left breast lumpectomy with re-excision on 11/29/95 for a T2 N1bi IDC of the left breast, grade 2, Stage IIB, ER 98%, PR 97%, Ki67 8%.  #2  She underwent 4 cycles of AC followed by radiation therapy under the care of Dr. [name].  #3  She took Tamoxifen for a total of seven years per patient.  #4  S/P biopsy of the right breast on 11/14/10 resulting IMC with MCIS, grade 2, ER 85%, PR 57%, Ki67 20%, HER2 1.21.    #5  She started neoadjuvant letrozolein November 2012 and has continued it since.  #6  S/P right lumpectomy with sentinel lymph node biopsy on 09/03/11 by Dr. Ezzard [name] for a ypT2, ypN1a invasive lobular carcinoma of the right breast, 2.5 cm, grade 2, Stage IIB, with one of one node positive.  #7  She underwent radiation therapy under the care of Dr. Kathrynn [name] from 10/19/11 to 12/03/11.   #8 patient does not meet criteria for genetic testing according to her insurance company.  #9 osteopenia, with a T score of -1.6 on DEXA scan at [facility] 03/05/2011  PLAN:  We spent a little over 40 minutes today going over details problems, not all of which are related to breast cancer. Stacey [name] has gone back to letrozole, which you will continue another 3 years. She really has osteopenia at baseline, and we will repeat a bone density before her return visit here in 6 months. If there has been significant bone loss we will consider bisphosphonates.  Given the uncertainty regarding her genetic situation, which cannot be clarified by definitive test, I would favor bilateral salpingo-oophorectomy in her case. She will discuss this with Dr. Jennette Kettle at her next visit.  What is worrying her the most is her fatigue. I would describe this as moderate, but it is very bothersome for her. She is not depressed. There are no signs or symptoms of hypothyroidism, but we will add TSH to her next of labs  here.  On the other hand she has severe sleep disturbance problems. We discussed this at length today. She is going to not take daytime naps and she will start waking up in the morning every day at the same time. She will try Benadryl at bedtime. She will not use Ambien more than twice a week. I am hopeful if she makes these changes the sleep problem we'll get better and if so perhaps the fatigue also will improve.  She knows to call for any problems that may develop before her next visit here. Stacey [name] C    11/28/2012

## 2012-11-28 NOTE — Telephone Encounter (Signed)
appts made and printed. [facility] appt for [facility]...td 

## 2012-11-29 DIAGNOSIS — Z23 Encounter for immunization: Secondary | ICD-10-CM | POA: Diagnosis not present

## 2012-12-05 ENCOUNTER — Other Ambulatory Visit: Payer: Self-pay | Admitting: Internal Medicine

## 2012-12-05 MED ORDER — ZOLPIDEM TARTRATE 10 MG PO TABS: ORAL_TABLET | ORAL | Status: DC

## 2012-12-05 NOTE — Telephone Encounter (Signed)
Faxed script back to [facility]...lmb 

## 2012-12-05 NOTE — Telephone Encounter (Signed)
Ok to refill 30d, no refill (per protocol covering for absent PCP) - prescription printed and signed - placed on triage desk  

## 2012-12-08 ENCOUNTER — Other Ambulatory Visit: Payer: Self-pay

## 2013-02-09 ENCOUNTER — Encounter: Payer: Self-pay | Admitting: Internal Medicine

## 2013-02-09 ENCOUNTER — Ambulatory Visit (INDEPENDENT_AMBULATORY_CARE_PROVIDER_SITE_OTHER)
Admission: RE | Admit: 2013-02-09 | Discharge: 2013-02-09 | Disposition: A | Discharge status: Home / Self Care | Payer: Medicare Other | Source: Ambulatory Visit | Attending: Internal Medicine | Admitting: Internal Medicine

## 2013-02-09 ENCOUNTER — Other Ambulatory Visit (INDEPENDENT_AMBULATORY_CARE_PROVIDER_SITE_OTHER): Payer: Medicare Other

## 2013-02-09 ENCOUNTER — Ambulatory Visit (INDEPENDENT_AMBULATORY_CARE_PROVIDER_SITE_OTHER): Payer: Medicare Other | Admitting: Internal Medicine

## 2013-02-09 VITALS — BP 126/70 | HR 80 | Temp 98.0°F | Wt 151.8 lb

## 2013-02-09 DIAGNOSIS — Z79899 Other long term (current) drug therapy: Secondary | ICD-10-CM

## 2013-02-09 DIAGNOSIS — R1032 Left lower quadrant pain: Secondary | ICD-10-CM

## 2013-02-09 DIAGNOSIS — K8020 Calculus of gallbladder without cholecystitis without obstruction: Secondary | ICD-10-CM

## 2013-02-09 LAB — URINALYSIS, ROUTINE W REFLEX MICROSCOPIC
Hgb urine dipstick: NEGATIVE
Ketones, ur: NEGATIVE
Leukocytes, UA: NEGATIVE
NITRITE: NEGATIVE
PH: 5.5 (ref 5.0–8.0)
Specific Gravity, Urine: >=1.03 — AB (ref 1.000–1.030)
URINE GLUCOSE: NEGATIVE
Urobilinogen, UA: 0.2 (ref 0.0–1.0)

## 2013-02-09 LAB — BASIC METABOLIC PANEL
BUN: 20 mg/dL (ref 6–23)
CALCIUM: 9.7 mg/dL (ref 8.4–10.5)
CHLORIDE: 105 meq/L (ref 96–112)
CO2: 29 meq/L (ref 19–32)
Creatinine, Ser: 0.8 mg/dL (ref 0.4–1.2)
GFR: 71.64 mL/min (ref >60.00)
GLUCOSE: 99 mg/dL (ref 70–99)
Potassium: 4.1 mEq/L (ref 3.5–5.1)
Sodium: 142 mEq/L (ref 135–145)

## 2013-02-09 MED ORDER — ZOLPIDEM TARTRATE 10 MG PO TABS: ORAL_TABLET | ORAL | Status: DC

## 2013-02-09 MED ORDER — IOHEXOL 300 MG/ML  SOLN
100.0000 mL | Freq: Once | INTRAMUSCULAR | Status: AC | PRN
  Administered 2013-02-09: 100 mL via INTRAVENOUS

## 2013-02-09 NOTE — Progress Notes (Signed)
Subjective:    Patient ID: Stacey [name], female    DOB: Jun 25, 1940, 73 y.o.   MRN: [id number] Chief Complaint  Patient presents with  . Groin Pain    Left side- also want refill on her ambien    Groin Pain The patient's primary symptoms include a genital rash. The patient's pertinent negatives include no genital itching, genital lesions, missed menses, vaginal bleeding or vaginal discharge. This is a recurrent problem. The current episode started more than 1 year ago. The problem occurs intermittently. Progression since onset: Friday morning to monday -constant. The pain is moderate. The problem affects the left side. She is not pregnant. Associated symptoms include rash. Pertinent negatives include no abdominal pain, back pain, chills, constipation, diarrhea, discolored urine, fever, flank pain, hematuria, joint pain, nausea or urgency. Nothing aggravates the symptoms. She has tried nothing for the symptoms. The treatment provided no relief. It is unknown whether or not her partner has an STD. She is postmenopausal. Her past medical history is significant for an abdominal surgery.   Past Medical History  Diagnosis Date  . ANEMIA-NOS   . ANXIETY   . COPD     resolved  . DIVERTICULOSIS, COLON 2008  . GERD   . HYPERLIPIDEMIA   . IRRITABLE BOWEL SYNDROME, HX OF   . OSTEOARTHRITIS, HAND   . Breast cancer 1997 L, 2012 R    s/p chemo/xrt  . Hx of radiation therapy 10/19/11 -12/03/11    right breast   History   Social History  . Marital Status: Divorced    Spouse Name: N/A    Number of Children: 0  . Years of Education: N/A   Occupational History  . retired Banker    Social History Main Topics  . Smoking status: Current Some Day Smoker -- 0.25 packs/day for 40 years    Types: Cigarettes  . Smokeless tobacco: Not on file  . Alcohol Use: Yes     Comment: rare/ drinks socially  . Drug Use: No  . Sexual Activity: Not on file   Other Topics Concern  . Not on file   Social  History Narrative   Patient gets no regular exercise   No biological children   2 step children   Family History  Problem Relation Age of Onset  . Alcohol abuse Mother     ETOH dependence  . Hypertension Mother   . Stroke Mother   . Colon polyps Mother   . Diabetes Mother   . Pancreatic cancer Mother 74  . Uterine cancer Mother 17  . Lymphoma Brother     burkitts  . Breast cancer Cousin     1 maternal, 2 paternal  . Lung cancer Paternal Uncle   . Lung cancer Maternal Grandmother 33    non-smoker  . Lung cancer Maternal Grandfather   . Breast cancer Cousin     maternal cousin, dx in her mid 54s  . Brain cancer Cousin     maternal cousin's son; dx in his 58s  . Testicular cancer Cousin     maternal cousin's son;   . Breast cancer Cousin     paternal cousin; dx in her 37s   Allergies  Allergen Reactions  . Clindamycin/Lincomycin Diarrhea and Nausea And Vomiting  . Codeine Nausea And Vomiting       Review of Systems  Constitutional: Negative.  Negative for fever and chills.  HENT: Negative.   Gastrointestinal: Negative for nausea, abdominal pain, diarrhea, constipation and abdominal distention.  Genitourinary: Positive for difficulty urinating. Negative for urgency, hematuria, flank pain, decreased urine volume, vaginal bleeding, vaginal discharge, menstrual problem and missed menses.  Musculoskeletal: Negative for back pain, joint pain, joint swelling and neck pain.  Skin: Positive for rash.       Objective:   Physical Exam  Constitutional: She is oriented to person, place, and time. She appears well-developed and well-nourished.  HENT:  Head: Normocephalic and atraumatic.  Eyes: Pupils are equal, round, and reactive to light.  Cardiovascular: Normal rate, regular rhythm, normal heart sounds and intact distal pulses.   Pulmonary/Chest: Effort normal and breath sounds normal.  Abdominal: Soft.  Musculoskeletal: She exhibits no edema and no tenderness.        Right hip: Normal.       Left hip: She exhibits normal range of motion, normal strength, no tenderness, no swelling, no crepitus and no deformity.  Neurological: She is alert and oriented to person, place, and time.  Skin: Skin is warm and dry.  Psychiatric: She has a normal mood and affect. Her behavior is normal.   Filed Vitals:   02/09/13 0828  BP: 126/70  Pulse: 80  Temp: 98 F (36.7 C)  TempSrc: Oral  Weight: 151 lb 12.8 oz (68.856 kg)  SpO2: 95%    Wt Readings from Last 3 Encounters:  02/09/13 151 lb 12.8 oz (68.856 kg)  11/28/12 152 lb 12.8 oz (69.31 kg)  09/12/12 146 lb (66.225 kg)   Lab Results  Component Value Date   WBC 7.1 11/21/2012   HGB 12.4 11/21/2012   HCT 36.9 11/21/2012   PLT 286 11/21/2012   GLUCOSE 98 11/21/2012   CHOL 216* 09/12/2012   TRIG 202.0* 09/12/2012   HDL 71.50 09/12/2012   LDLDIRECT 129.2 09/12/2012   LDLCALC 84 07/04/2009   ALT 21 11/21/2012   AST 27 11/21/2012   NA 142 11/21/2012   K 3.9 11/21/2012   CL 101 09/12/2012   CREATININE 0.7 11/21/2012   BUN 16.2 11/21/2012   CO2 23 11/21/2012   TSH 1.77 09/12/2012    Dg Lumbar Spine Complete  09/12/2012   *RADIOLOGY REPORT*  Clinical Data: Low back pain  LUMBAR SPINE - COMPLETE 4+ VIEW  Comparison: None.  Findings: Minimal dextroscoliosis of the lumbar spine is noted.  No fracture is noted.  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  No significant spondylolisthesis is noted.  IMPRESSION: Minimal degenerative disc disease is noted at L3-4 and L4-5.  No acute abnormality is noted in the lumbar spine.   Original Report Authenticated By: Lupita Raider.,  M.D.   Dg Hip Complete Right  09/12/2012   *RADIOLOGY REPORT*  Clinical Data: Recurrent right lateral hip pain  RIGHT HIP - COMPLETE 2+ VIEW  Comparison: Lumbar spine radiographs 09/12/2012  Findings: The right hip is located.  The joints spaces is maintained.  No significant bony degenerative changes are seen. There is no evidence of  fracture or focal osseous lesion. Sacroiliac joints are unremarkable.  Pubic symphysis appears normal.  IMPRESSION: No acute bony abnormality or significant degenerative change.   Original Report Authenticated By: Britta Mccreedy, M.D.     Assessment & Plan:  LLQ/Groin pain -  No evidence of musculoskeletal problem.  Ordered a urinalysis and CT a/p with contrast.  No new medication was prescribed at this time.      Patient was instructed to call if pain symptoms changed.  A follow up will be scheduled if CT is abnormal.  I have personally reviewed this case with PA student. I also personally examined this patient. I agree with history and findings as documented above. I reviewed, discussed and approve of the assessment and plan as listed above. Gwendolyn Grant, MD

## 2013-02-09 NOTE — Progress Notes (Addendum)
   Subjective:    Patient ID: Stacey [name], female    DOB: 25-May-1940, 73 y.o.   MRN: [id number]  HPI  See PA student note  Past Medical History  Diagnosis Date  . ANEMIA-NOS 12/17/2006  . ANXIETY 05/03/2009  . CONSTIPATION 03/28/2010  . COPD     resolved  . Cough 12/17/2006  . DIVERTICULOSIS, COLON 12/17/2006  . FATIGUE 06/14/2007  . GERD 03/28/2010  . HYPERLIPIDEMIA 06/14/2007  . IRRITABLE BOWEL SYNDROME, HX OF 12/17/2006  . 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 Cancer     bilateral  . Breast cancer 1997    s/p chemo/xrt  . Hx of radiation therapy 10/19/11 -12/03/11    right breast    Review of Systems  Constitutional: Negative for fever, fatigue and unexpected weight change.  Respiratory: Negative for cough and shortness of breath.   Genitourinary: Positive for pelvic pain. Negative for urgency, frequency, hematuria, flank pain, vaginal bleeding, vaginal discharge, difficulty urinating and vaginal pain.  Musculoskeletal: Negative for arthralgias, back pain, gait problem and joint swelling.       Objective:   Physical Exam BP 126/70  Pulse 80  Temp(Src) 98 F (36.7 C) (Oral)  Wt 151 lb 12.8 oz (68.856 kg)  SpO2 95% Wt Readings from Last 3 Encounters:  02/09/13 151 lb 12.8 oz (68.856 kg)  11/28/12 152 lb 12.8 oz (69.31 kg)  09/12/12 146 lb (66.225 kg)   Constitutional: She appears well-developed and well-nourished. No distress.  Neck: Normal range of motion. Neck supple. No JVD present. No thyromegaly present.  Cardiovascular: Normal rate, regular rhythm and normal heart sounds.  No murmur heard. No BLE edema. Pulmonary/Chest: Effort normal and breath sounds normal. No respiratory distress. She has no wheezes.  Abdominal: Soft. Bowel sounds are normal. She exhibits no distension. There is no tenderness. no masses Musculoskeletal: B hip - nontender to palp over groin. Normal range of motion, nonpainful internal and external rotation. no joint effusions. No gross  deformities  Psychiatric: She has a normal mood and affect. Her behavior is normal. Judgment and thought content normal.   Lab Results  Component Value Date   WBC 7.1 11/21/2012   HGB 12.4 11/21/2012   HCT 36.9 11/21/2012   PLT 286 11/21/2012   GLUCOSE 98 11/21/2012   CHOL 216* 09/12/2012   TRIG 202.0* 09/12/2012   HDL 71.50 09/12/2012   LDLDIRECT 129.2 09/12/2012   LDLCALC 84 07/04/2009   ALT 21 11/21/2012   AST 27 11/21/2012   NA 142 11/21/2012   K 3.9 11/21/2012   CL 101 09/12/2012   CREATININE 0.7 11/21/2012   BUN 16.2 11/21/2012   CO2 23 11/21/2012   TSH 1.77 09/12/2012       Assessment & Plan:   LLQ pain, intermittent - resolved at this time Not reproducible on exam with palpation of activity -  Hip exam benign ?colon spasm or diverticulitis. ?UTI/renal colic  Check UA and refer for CT a/p with CM No specific medication recommended at this time pending further diagnostics Consider physical therapy and/or anti-inflammatory as needed   Addendum: CT abdomen pelvis with cholelithiasis, largest 1.7 cm stone. No evidence of acute cholecystitis or obstruction. Mild duodenitis. No clear explanation for lower quadrant symptoms.  Will refer to general surgeon for evaluation of cholelithiasis, but no treatment changes recommended for mild, currently resolved, left lower quadrant pain. Patient notified of same via [place]

## 2013-02-09 NOTE — Progress Notes (Signed)
Pre-visit discussion using our clinic review tool. No additional management support is needed unless otherwise documented below in the visit note.  

## 2013-02-09 NOTE — Patient Instructions (Addendum)
It was good to see you today.  We have reviewed your prior records including labs and tests today  Test(s) ordered today. Your results will be released to [place] (or called to you) after review, usually within 72hours after test completion. If any changes need to be made, you will be notified at that same time.  we'll make referral for CT of her abdomen and pelvis to evaluate left sided abdominal and pelvic pain . Our office will contact you regarding appointment(s) once made. After review of results, we will contact you with further plans as needed  Medications reviewed and updated, no changes recommended at this time.  Followup as planned with Dr. Jenny Reichmann every 6 months, please call sooner if problems or if recurrent symptoms unimproved   Pelvic Pain, Female Female pelvic pain can be caused by many different things and start from a variety of places. Pelvic pain refers to pain that is located in the lower half of the abdomen and between your hips. The pain may occur over a short period of time (acute) or may be reoccurring (chronic). The cause of pelvic pain may be related to disorders affecting the female reproductive organs (gynecologic), but it may also be related to the bladder, kidney stones, an intestinal complication, or muscle or skeletal problems. Getting help right away for pelvic pain is important, especially if there has been severe, sharp, or a sudden onset of unusual pain. It is also important to get help right away because some types of pelvic pain can be life threatening.  CAUSES  Below are only some of the causes of pelvic pain. The causes of pelvic pain can be in one of several categories.   Gynecologic.  Pelvic inflammatory disease.  Sexually transmitted infection.  Ovarian cyst or a twisted ovarian ligament (ovarian torsion).  Uterine lining that grows outside the uterus (endometriosis).  Fibroids, cysts, or tumors.  Ovulation.  Pregnancy.  Pregnancy that occurs  outside the uterus (ectopic pregnancy).  Miscarriage.  Labor.  Abruption of the placenta or ruptured uterus.  Infection.  Uterine infection (endometritis).  Bladder infection.  Diverticulitis.  Miscarriage related to a uterine infection (septic abortion).  Bladder.  Inflammation of the bladder (cystitis).  Kidney stone(s).  Gastrointenstinal.  Constipation.  Diverticulitis.  Neurologic.  Trauma.  Feeling pelvic pain because of mental or emotional causes (psychosomatic).  Cancers of the bowel or pelvis. EVALUATION  Your caregiver will want to take a careful history of your concerns. This includes recent changes in your health, a careful gynecologic history of your periods (menses), and a sexual history. Obtaining your family history and medical history is also important. Your caregiver may suggest a pelvic exam. A pelvic exam will help identify the location and severity of the pain. It also helps in the evaluation of which organ system may be involved. In order to identify the cause of the pelvic pain and be properly treated, your caregiver may order tests. These tests may include:   A pregnancy test.  Pelvic ultrasonography.  An X-ray exam of the abdomen.  A urinalysis or evaluation of vaginal discharge.  Blood tests. HOME CARE INSTRUCTIONS   Only take over-the-counter or prescription medicines for pain, discomfort, or fever as directed by your caregiver.   Rest as directed by your caregiver.   Eat a balanced diet.   Drink enough fluids to make your urine clear or pale yellow, or as directed.   Avoid sexual intercourse if it causes pain.   Apply warm or cold  compresses to the lower abdomen depending on which one helps the pain.   Avoid stressful situations.   Keep a journal of your pelvic pain. Write down when it started, where the pain is located, and if there are things that seem to be associated with the pain, such as food or your menstrual  cycle.  Follow up with your caregiver as directed.  SEEK MEDICAL CARE IF:  Your medicine does not help your pain.  You have abnormal vaginal discharge. SEEK IMMEDIATE MEDICAL CARE IF:   You have heavy bleeding from the vagina.   Your pelvic pain increases.   You feel lightheaded or faint.   You have chills.   You have pain with urination or blood in your urine.   You have uncontrolled diarrhea or vomiting.   You have a fever or persistent symptoms for more than 3 days.  You have a fever and your symptoms suddenly get worse.   You are being physically or sexually abused.  MAKE SURE YOU:  Understand these instructions.  Will watch your condition.  Will get help if you are not doing well or get worse. Document Released: 12/17/2003 Document Revised: 07/21/2011 Document Reviewed: 05/11/2011 [facility] Patient Information 2014 [place], [place].

## 2013-02-09 NOTE — Addendum Note (Signed)
Addended by: Gwendolyn Grant A on: 02/09/2013 01:31 PM   Modules accepted: Orders

## 2013-02-10 ENCOUNTER — Encounter: Payer: Self-pay | Admitting: Internal Medicine

## 2013-02-10 NOTE — Addendum Note (Signed)
Addended by: Gwendolyn Grant A on: 02/10/2013 08:33 AM   Modules accepted: Orders

## 2013-02-16 DIAGNOSIS — H40019 Open angle with borderline findings, low risk, unspecified eye: Secondary | ICD-10-CM | POA: Diagnosis not present

## 2013-02-16 DIAGNOSIS — H353190 Nonexudative age-related macular degeneration, unspecified eye, stage unspecified: Secondary | ICD-10-CM | POA: Diagnosis not present

## 2013-02-16 DIAGNOSIS — H43819 Vitreous degeneration, unspecified eye: Secondary | ICD-10-CM | POA: Diagnosis not present

## 2013-02-16 DIAGNOSIS — H2510 Age-related nuclear cataract, unspecified eye: Secondary | ICD-10-CM | POA: Diagnosis not present

## 2013-02-21 ENCOUNTER — Encounter (INDEPENDENT_AMBULATORY_CARE_PROVIDER_SITE_OTHER): Payer: Self-pay | Admitting: General Surgery

## 2013-02-21 ENCOUNTER — Ambulatory Visit (INDEPENDENT_AMBULATORY_CARE_PROVIDER_SITE_OTHER): Payer: Medicare Other | Admitting: General Surgery

## 2013-02-21 VITALS — BP 122/70 | HR 80 | Resp 18 | Ht 64.0 in | Wt 151.8 lb

## 2013-02-21 DIAGNOSIS — K8020 Calculus of gallbladder without cholecystitis without obstruction: Secondary | ICD-10-CM | POA: Diagnosis not present

## 2013-02-21 NOTE — Progress Notes (Signed)
Patient ID: Stacey [name], female   DOB: 04/24/1940, 73 y.o.   MRN: [id number]  No chief complaint on file.   HPI Stacey [name] is a 73 y.o. female.  We are asked to see the patient in consultation by Dr. Asa Lente to evaluate her for gallstones. The patient is a 74 year old white female who actually presented initially with some left groin pain. The pain lasted 3 days and then disappeared. She does have a history of constipation for which she takes MiraLAX on a daily basis. She denies any nausea or vomiting. She denies any upper abdominal pain. Her appetite is good and eating does not cause her any discomfort. HPI  Past Medical History  Diagnosis Date  . ANEMIA-NOS   . ANXIETY   . COPD     resolved  . DIVERTICULOSIS, COLON 2008  . GERD   . HYPERLIPIDEMIA   . IRRITABLE BOWEL SYNDROME, HX OF   . OSTEOARTHRITIS, HAND   . Breast cancer 1997 L, 2012 R    s/p chemo/xrt  . Hx of radiation therapy 10/19/11 -12/03/11    right breast    Past Surgical History  Procedure Laterality Date  . Appendectomy    . Tonsillectomy    . Rectal fissure repair    . Abdominal hysterectomy    . Inguinal herniorrhapy  1984    left  . Tmj arthroplasty  1989  . S/p lumpectomy  1997    melignant left x 2  . S/p benign breast biopsy  2003    right  . S/p left foot surgury  2009  . Spiral fx left foot  2008    no surgury  . Breast surgery      lumpectomy  . Breast biopsy  11/14/10     r breast: inv, insitu mammary carcinoma w/calcif, er/pr +, her2 -  . Hernia repair      Family History  Problem Relation Age of Onset  . Alcohol abuse Mother     ETOH dependence  . Hypertension Mother   . Stroke Mother   . Colon polyps Mother   . Diabetes Mother   . Pancreatic cancer Mother 35  . Uterine cancer Mother 32  . Lymphoma Brother     burkitts  . Breast cancer Cousin     1 maternal, 2 paternal  . Lung cancer Paternal Uncle   . Lung cancer Maternal Grandmother 30    non-smoker  . Lung cancer  Maternal Grandfather   . Breast cancer Cousin     maternal cousin, dx in her mid 107s  . Brain cancer Cousin     maternal cousin's son; dx in his 66s  . Testicular cancer Cousin     maternal cousin's son;   . Breast cancer Cousin     paternal cousin; dx in her 45s    Social History History  Substance Use Topics  . Smoking status: Current Some Day Smoker -- 0.25 packs/day for 40 years    Types: Cigarettes  . Smokeless tobacco: Not on file  . Alcohol Use: Yes     Comment: rare/ drinks socially    Allergies  Allergen Reactions  . Clindamycin/Lincomycin Diarrhea and Nausea And Vomiting  . Codeine Nausea And Vomiting    Current Outpatient Prescriptions  Medication Sig Dispense Refill  . aspirin 81 MG EC tablet Take 81 mg by mouth daily.        . calcium-vitamin D (OSCAL WITH D) 500-200 MG-UNIT per tablet Take 1 tablet  by mouth daily.      . CELEBREX 200 MG capsule take 1 capsule by mouth twice a day if needed for pain  60 capsule  3  . celecoxib (CELEBREX) 200 MG capsule Take 200 mg by mouth daily.      . cholecalciferol (VITAMIN D) 1000 UNITS tablet Take 1,000 Units by mouth daily.        . clotrimazole-betamethasone (LOTRISONE) cream Use as directed twice per day as needed  15 g  1  . diphenhydrAMINE (BENADRYL) 25 MG tablet Take 25 mg by mouth every 6 (six) hours as needed.      Marland Kitchen EPINEPHrine (EPIPEN) 0.3 mg/0.3 mL DEVI Use as directed       . fluticasone (FLONASE) 50 MCG/ACT nasal spray instill 2 sprays into each nostril once daily  16 g  3  . letrozole ([name]) 2.5 MG tablet Take 1 tablet (2.5 mg total) by mouth daily.  90 tablet  11  . Multiple Vitamin (MULTIVITAMIN) capsule Take 1 capsule by mouth daily.        Marland Kitchen omeprazole (PRILOSEC) 20 MG capsule take 1 capsule by mouth once daily  30 capsule  11  . polyethylene glycol (MIRALAX / GLYCOLAX) packet Take 17 g by mouth daily.      . polyethylene glycol powder (GLYCOLAX/MIRALAX) powder use as directed  527 g  1  .  pseudoephedrine (SUDAFED) 30 MG tablet Take 30 mg by mouth daily.       Marland Kitchen zolpidem (AMBIEN) 10 MG tablet take 1 tablet by mouth at bedtime if needed  30 tablet  0   No current facility-administered medications for this visit.    Review of Systems Review of Systems  Constitutional: Negative.   HENT: Negative.   Eyes: Negative.   Respiratory: Negative.   Cardiovascular: Negative.   Gastrointestinal: Negative.   Endocrine: Negative.   Genitourinary: Negative.   Musculoskeletal: Negative.   Skin: Negative.   Allergic/Immunologic: Negative.   Neurological: Negative.   Hematological: Negative.   Psychiatric/Behavioral: Negative.     Blood pressure 122/70, pulse 80, resp. rate 18, height '5\' 4"'  (1.626 m), weight 151 lb 12.8 oz (68.856 kg).  Physical Exam Physical Exam  Constitutional: She is oriented to person, place, and time. She appears well-developed and well-nourished.  HENT:  Head: Normocephalic and atraumatic.  Eyes: Conjunctivae and EOM are normal. Pupils are equal, round, and reactive to light.  Neck: Normal range of motion. Neck supple.  Cardiovascular: Normal rate, regular rhythm and normal heart sounds.   Pulmonary/Chest: Effort normal and breath sounds normal.  Abdominal: Soft. Bowel sounds are normal.  Musculoskeletal: Normal range of motion.  Lymphadenopathy:    She has no cervical adenopathy.  Neurological: She is alert and oriented to person, place, and time.  Skin: Skin is warm and dry.  Psychiatric: She has a normal mood and affect. Her behavior is normal.    Data Reviewed As above  Assessment    The patient appears to have a 1.7 cm stone in her gallbladder but she is completely asymptomatic. I've discussed with her in detail the symptoms of biliary colic and she understands. I've discussed with her in detail the risks and benefits of the operation to remove the gallbladder as well as some of the technical aspects and she understands this as well.      Plan    At this point and she is asymptomatic she would like to think about whether she might have surgery or not. If  she chooses surgery she can call us and we will make all the arrangements. Otherwise we will see her back on a when necessary basis        TOTH III,Bijal Siglin S 02/21/2013, 12:08 PM

## 2013-02-28 DIAGNOSIS — H2510 Age-related nuclear cataract, unspecified eye: Secondary | ICD-10-CM | POA: Diagnosis not present

## 2013-02-28 DIAGNOSIS — H269 Unspecified cataract: Secondary | ICD-10-CM | POA: Diagnosis not present

## 2013-03-07 ENCOUNTER — Other Ambulatory Visit: Payer: Self-pay | Admitting: Internal Medicine

## 2013-03-07 DIAGNOSIS — M899 Disorder of bone, unspecified: Secondary | ICD-10-CM | POA: Diagnosis not present

## 2013-03-07 DIAGNOSIS — M949 Disorder of cartilage, unspecified: Secondary | ICD-10-CM | POA: Diagnosis not present

## 2013-03-16 ENCOUNTER — Other Ambulatory Visit: Payer: Self-pay | Admitting: Internal Medicine

## 2013-04-04 ENCOUNTER — Other Ambulatory Visit: Payer: Self-pay | Admitting: *Deleted

## 2013-04-04 MED ORDER — ZOLPIDEM TARTRATE 10 MG PO TABS: ORAL_TABLET | ORAL | Status: DC

## 2013-04-04 NOTE — Telephone Encounter (Signed)
MD out of office pls advise on refill.../lmb 

## 2013-04-04 NOTE — Telephone Encounter (Signed)
Faxed script back to [facility].../lmb 

## 2013-04-11 DIAGNOSIS — H2510 Age-related nuclear cataract, unspecified eye: Secondary | ICD-10-CM | POA: Diagnosis not present

## 2013-05-22 ENCOUNTER — Other Ambulatory Visit (HOSPITAL_BASED_OUTPATIENT_CLINIC_OR_DEPARTMENT_OTHER): Payer: Medicare Other

## 2013-05-22 DIAGNOSIS — M8580 Other specified disorders of bone density and structure, unspecified site: Secondary | ICD-10-CM

## 2013-05-22 DIAGNOSIS — R5383 Other fatigue: Secondary | ICD-10-CM

## 2013-05-22 DIAGNOSIS — C50919 Malignant neoplasm of unspecified site of unspecified female breast: Secondary | ICD-10-CM

## 2013-05-22 DIAGNOSIS — E559 Vitamin D deficiency, unspecified: Secondary | ICD-10-CM

## 2013-05-22 DIAGNOSIS — C50211 Malignant neoplasm of upper-inner quadrant of right female breast: Secondary | ICD-10-CM

## 2013-05-22 DIAGNOSIS — C50219 Malignant neoplasm of upper-inner quadrant of unspecified female breast: Secondary | ICD-10-CM

## 2013-05-22 DIAGNOSIS — C50911 Malignant neoplasm of unspecified site of right female breast: Secondary | ICD-10-CM

## 2013-05-22 LAB — CBC WITH DIFFERENTIAL/PLATELET
BASO%: 0.3 % (ref 0.0–2.0)
BASOS ABS: 0 10*3/uL (ref 0.0–0.1)
EOS ABS: 0.2 10*3/uL (ref 0.0–0.5)
EOS%: 2.5 % (ref 0.0–7.0)
HCT: 37.2 % (ref 34.8–46.6)
HEMOGLOBIN: 12.5 g/dL (ref 11.6–15.9)
LYMPH#: 1.5 10*3/uL (ref 0.9–3.3)
LYMPH%: 23.1 % (ref 14.0–49.7)
MCH: 29.5 pg (ref 25.1–34.0)
MCHC: 33.6 g/dL (ref 31.5–36.0)
MCV: 87.7 fL (ref 79.5–101.0)
MONO#: 0.5 10*3/uL (ref 0.1–0.9)
MONO%: 8 % (ref 0.0–14.0)
NEUT%: 66.1 % (ref 38.4–76.8)
NEUTROS ABS: 4.2 10*3/uL (ref 1.5–6.5)
Platelets: 226 10*3/uL (ref 145–400)
RBC: 4.24 10*6/uL (ref 3.70–5.45)
RDW: 13.8 % (ref 11.2–14.5)
WBC: 6.3 10*3/uL (ref 3.9–10.3)

## 2013-05-22 LAB — COMPREHENSIVE METABOLIC PANEL (CC13)
ALT: 17 U/L (ref 0–55)
ANION GAP: 9 meq/L (ref 3–11)
AST: 24 U/L (ref 5–34)
Albumin: 3.9 g/dL (ref 3.5–5.0)
Alkaline Phosphatase: 63 U/L (ref 40–150)
BUN: 13.7 mg/dL (ref 7.0–26.0)
CALCIUM: 9.8 mg/dL (ref 8.4–10.4)
CO2: 22 mEq/L (ref 22–29)
CREATININE: 0.8 mg/dL (ref 0.6–1.1)
Chloride: 111 mEq/L — ABNORMAL HIGH (ref 98–109)
Glucose: 126 mg/dl (ref 70–140)
Potassium: 3.7 mEq/L (ref 3.5–5.1)
Sodium: 143 mEq/L (ref 136–145)
Total Bilirubin: 0.32 mg/dL (ref 0.20–1.20)
Total Protein: 6.9 g/dL (ref 6.4–8.3)

## 2013-05-22 LAB — TSH CHCC: TSH: 1.323 m(IU)/L (ref 0.308–3.960)

## 2013-05-23 ENCOUNTER — Telehealth: Payer: Self-pay | Admitting: Oncology

## 2013-05-23 NOTE — Telephone Encounter (Signed)
, °

## 2013-05-27 ENCOUNTER — Other Ambulatory Visit: Payer: Self-pay | Admitting: Internal Medicine

## 2013-05-29 ENCOUNTER — Other Ambulatory Visit: Payer: Self-pay | Admitting: Internal Medicine

## 2013-05-29 ENCOUNTER — Ambulatory Visit: Payer: Medicare Other | Admitting: Oncology

## 2013-05-30 NOTE — Telephone Encounter (Signed)
Faxed hardcopy to [facility]

## 2013-05-30 NOTE — Telephone Encounter (Signed)
Done hardcopy to robin  

## 2013-06-02 ENCOUNTER — Telehealth: Payer: Self-pay | Admitting: Oncology

## 2013-06-02 ENCOUNTER — Ambulatory Visit (HOSPITAL_BASED_OUTPATIENT_CLINIC_OR_DEPARTMENT_OTHER): Payer: Medicare Other | Admitting: Hematology and Oncology

## 2013-06-02 VITALS — BP 135/78 | HR 75 | Temp 98.2°F | Resp 18 | Ht 64.0 in | Wt 153.8 lb

## 2013-06-02 DIAGNOSIS — C50211 Malignant neoplasm of upper-inner quadrant of right female breast: Secondary | ICD-10-CM

## 2013-06-02 DIAGNOSIS — Z170 Estrogen receptor positive status [ER+]: Secondary | ICD-10-CM | POA: Diagnosis not present

## 2013-06-02 DIAGNOSIS — Z853 Personal history of malignant neoplasm of breast: Secondary | ICD-10-CM | POA: Diagnosis not present

## 2013-06-02 DIAGNOSIS — C50219 Malignant neoplasm of upper-inner quadrant of unspecified female breast: Secondary | ICD-10-CM

## 2013-06-02 DIAGNOSIS — R5383 Other fatigue: Secondary | ICD-10-CM | POA: Diagnosis not present

## 2013-06-02 DIAGNOSIS — R5381 Other malaise: Secondary | ICD-10-CM | POA: Diagnosis not present

## 2013-06-02 NOTE — Telephone Encounter (Signed)
, °

## 2013-06-03 NOTE — Progress Notes (Signed)
ID: [name]   DOB: 04/06/1940  MR#: [id number]  OAC#:[id number]  PCP: Cathlean Cower, MD GYN:  Dr. Susette Racer SU:  Dr. Lucia Gaskins  OTHER MD:  Chief complaint:  followup visit for breast cancer  HISTORY OF PRESENT ILLNESS:  As per previously dictated Dr.Magrinat's note:   Stacey [name] is a 73 year old [place] woman, who has a history of breast cancer starting in 1997 and in 2013.    On 11/22/95, she underwent a left breast biopsy, which revealed malignant cells.  She had a left breast lumpectomy with re-excision on 11/29/95, with final pathology revealed IDC, grade 2, 2.5 cm, ER 98%, PR 97%, Ki67 8% with one of 15 nodes being positive.  She received chemotherapy, 4 cycles per patient, along with radiation and took Tamoxifen for 7 years following radiation completion.  Records from 1997 limited.    In October 2012, she had a biopsy of the right breast after mammography recommended additional images for a dense area in the right breast.  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  On 11/25/10, she had an MRI that measured the area in the right breast as 2.2 cm.  She started neoadjuvant [name] in November 2012 and has continued it since.  The area of concern measured 1.7 cm on a repeat MRI in March of 2013.  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  Surgical margins were negative with one of one sentinel lymph node positive for metastatic carcinoma.  She then underwent radiation therapy under the care of Dr. [name] from 10/19/11 to 12/03/11.  She has continued [name] since November of 2012 and has tolerated it well .  INTERVAL HISTORY:   Tai returns today for followup of her breast cancer. She was restarted on letrozole.  She does complain of intermittent hot flashes. She also complains of fatigue. She says that she is sleeping good 7 hours of sleep. She denies shortness of breath chest pain, constipation blood in the stool, blood in the urine. She also complains of  Arthralgias but those are tolerable. Denies any weight loss or decrease in appetite Her last mammogram was in October 2014 that revealed no evidence of any malignancy She is only taking 1 tablet of combined calcium with vitamin D supplementation. Her last DEXA scan was done in February 3,2015 and that revealed low bone mass.   REVIEW OF SYSTEMS: 14 point review of symptoms were assessed and pertinent symptoms were mentioned in interval history    PAST MEDICAL HISTORY: Past Medical History  Diagnosis Date  . ANEMIA-NOS   . ANXIETY   . COPD     resolved  . DIVERTICULOSIS, COLON 2008  . GERD   . HYPERLIPIDEMIA   . IRRITABLE BOWEL SYNDROME, HX OF   . OSTEOARTHRITIS, HAND   . Breast cancer 1997 L, 2012 R    s/p chemo/xrt  . Hx of radiation therapy 10/19/11 -12/03/11    right breast    PAST SURGICAL HISTORY: Past Surgical History  Procedure Laterality Date  . Appendectomy    . Tonsillectomy    . Rectal fissure repair    . Abdominal hysterectomy    . Inguinal herniorrhapy  1984    left  . Tmj arthroplasty  1989  .  S/p lumpectomy  1997    melignant left x 2  . S/p benign breast biopsy  2003    right  . S/p left foot surgury  2009  . Spiral fx left foot  2008    no surgury  . Breast surgery      lumpectomy  . Breast biopsy  11/14/10     r breast: inv, insitu mammary carcinoma w/calcif, er/pr +, her2 -  . Hernia repair      FAMILY HISTORY Family History  Problem Relation Age of Onset  . Alcohol abuse Mother     ETOH dependence  . Hypertension Mother   . Stroke Mother   . Colon polyps Mother   . Diabetes Mother   . Pancreatic cancer Mother 52  . Uterine cancer Mother 53  . Lymphoma Brother     burkitts  . Breast cancer  Cousin     1 maternal, 2 paternal  . Lung cancer Paternal Uncle   . Lung cancer Maternal Grandmother 36    non-smoker  . Lung cancer Maternal Grandfather   . Breast cancer Cousin     maternal cousin, dx in her mid 59s  . Brain cancer Cousin     maternal cousin's son; dx in his 74s  . Testicular cancer Cousin     maternal cousin's son;   . Breast cancer Cousin     paternal cousin; dx in her 41s    GYNECOLOGIC HISTORY:  She has two daughters.    SOCIAL HISTORY:  The patient lives alone and is divorced. She retired from [employer].  She continued to smoke and denies heavy alcohol use.   ADVANCED DIRECTIVES:  Living will  HEALTH MAINTENANCE: History  Substance Use Topics  . Smoking status: Current Some Day Smoker -- 0.25 packs/day for 40 years    Types: Cigarettes  . Smokeless tobacco: Not on file  . Alcohol Use: Yes     Comment: rare/ drinks socially     Colonoscopy:  Up to date per pt, reporting last in 2005  PAP:  Several years ago, has had hysterectomy but have both ovaries  Bone density:  02/15/11 resulting low bone mass, T-1.6  Lipid panel:  Managed by Dr. Jenny Reichmann  Allergies  Allergen Reactions  . Clindamycin/Lincomycin Diarrhea and Nausea And Vomiting  . Codeine Nausea And Vomiting    Current Outpatient Prescriptions  Medication Sig Dispense Refill  . aspirin 81 MG EC tablet Take 81 mg by mouth daily.        . calcium-vitamin D (OSCAL WITH D) 500-200 MG-UNIT per tablet Take 1 tablet by mouth daily.      . celecoxib (CELEBREX) 200 MG capsule Take 200 mg by mouth daily.      . cholecalciferol (VITAMIN D) 1000 UNITS tablet Take 1,000 Units by mouth daily.        . clotrimazole-betamethasone (LOTRISONE) cream Use as directed twice per day as needed  15 g  1  . diphenhydrAMINE (BENADRYL) 25 MG tablet Take 25 mg by mouth every 6 (six) hours as needed.      . fluticasone (FLONASE) 50 MCG/ACT nasal spray instill 2 sprays into each nostril once daily  16 g  3  . letrozole  ([name]) 2.5 MG tablet Take 1 tablet (2.5 mg total) by mouth daily.  90 tablet  11  . Multiple Vitamin (MULTIVITAMIN) capsule Take 1 capsule by mouth daily.        Marland Kitchen omeprazole (PRILOSEC) 20 MG capsule take 1  capsule by mouth once daily  30 capsule  11  . polyethylene glycol (MIRALAX / GLYCOLAX) packet Take 17 g by mouth daily.      Marland Kitchen zolpidem (AMBIEN) 10 MG tablet take 1 tablet by mouth at bedtime if needed  30 tablet  3  . EPINEPHrine (EPIPEN) 0.3 mg/0.3 mL DEVI Use as directed       . pseudoephedrine (SUDAFED) 30 MG tablet Take 30 mg by mouth daily.        No current facility-administered medications for this visit.    OBJECTIVE: Older white woman who appears well Filed Vitals:   06/02/13 1452  BP: 135/78  Pulse: 75  Temp: 98.2 F (36.8 C)  Resp: 18     Body mass index is 26.39 kg/(m^2).    ECOG FS:  1  HEENT PERRLA, sclera anicteric, conjunctiva no pallor , pupils equal round and reactive to light, with no JVD no thyromegaly Oropharynx no thrush or other lesions No cervical or supraclavicular adenopathy Lungs no rales or rhonchi Heart regular rate and rhythm Abd soft, nontender, positive bowel sounds MSK no focal spinal tenderness, no upper extremity edema Neuro: nonfocal, well-oriented Breasts: Both breasts are status post lumpectomy and radiation changes. There is no evidence of local recurrence. Both axillae are benign  LAB RESULTS: Lab Results  Component Value Date   WBC 6.3 05/22/2013   NEUTROABS 4.2 05/22/2013   HGB 12.5 05/22/2013   HCT 37.2 05/22/2013   MCV 87.7 05/22/2013   PLT 226 05/22/2013      Chemistry      Component Value Date/Time   NA 143 05/22/2013 1408   NA 142 02/09/2013 1418   NA 144 03/06/2011 0840   K 3.7 05/22/2013 1408   K 4.1 02/09/2013 1418   K 4.0 03/06/2011 0840   CL 105 02/09/2013 1418   CL 107 02/02/2012 0939   CL 106 03/06/2011 0840   CO2 22 05/22/2013 1408   CO2 29 02/09/2013 1418   CO2 28 03/06/2011 0840   BUN 13.7 05/22/2013 1408   BUN 20  02/09/2013 1418   BUN 17 03/06/2011 0840   CREATININE 0.8 05/22/2013 1408   CREATININE 0.8 02/09/2013 1418   CREATININE 0.8 03/06/2011 0840      Component Value Date/Time   CALCIUM 9.8 05/22/2013 1408   CALCIUM 9.7 02/09/2013 1418   CALCIUM 8.5 03/06/2011 0840   ALKPHOS 63 05/22/2013 1408   ALKPHOS 62 09/12/2012 1612   ALKPHOS 47 03/06/2011 0840   AST 24 05/22/2013 1408   AST 24 09/12/2012 1612   AST 30 03/06/2011 0840   ALT 17 05/22/2013 1408   ALT 20 09/12/2012 1612   ALT 26 03/06/2011 0840   BILITOT 0.32 05/22/2013 1408   BILITOT 0.5 09/12/2012 1612   BILITOT 0.40 03/06/2011 0840       Lab Results  Component Value Date   LABCA2 56* 12/10/2010    No components found with this basename: [id number]    No results found for this basename: INR,  in the last 168 hours  Urinalysis    Component Value Date/Time   COLORURINE YELLOW 02/09/2013 [place] 02/09/2013 0918   LABSPEC >=1.030* 02/09/2013 0918   PHURINE 5.5 02/09/2013 [employer] 02/09/2013 [place] 02/09/2013 0918   BILIRUBINUR SMALL* 02/09/2013 [place] 02/09/2013 0918   UROBILINOGEN 0.2 02/09/2013 0918   NITRITE NEGATIVE 02/09/2013 [place] NEGATIVE 02/09/2013 9450  STUDIES: Bilateral mammography at [facility] 11/19/2022 unremarkable.  ASSESSMENT/PLAN: 73 y.o. [place] woman: #1  S/P left breast lumpectomy with re-excision on 11/29/95 for a T2 N1bi IDC of the left breast, grade 2, Stage IIB, ER 98%, PR 97%, Ki67 8%.  #2  She underwent 4 cycles of AC followed by radiation therapy under the care of Dr. [name].  #3  She took Tamoxifen for a total of seven years per patient.  #4  S/P biopsy of the right breast on 11/14/10 resulting IMC with MCIS, grade 2, ER 85%, PR 57%, Ki67 20%, HER2 1.21.    #5  She started neoadjuvant letrozolein November 2012 and has continued it since.  #6  S/P right lumpectomy with sentinel lymph node biopsy on 09/03/11 by Dr. Lucia Gaskins for a ypT2, ypN1a invasive  lobular carcinoma of the right breast, 2.5 cm, grade 2, Stage IIB, with one of one node positive.  #7  She underwent radiation therapy under the care of Dr. [name] from 10/19/11 to 12/03/11.   #8 patient does not meet criteria for genetic testing according to her insurance company.  #9 DEXA scan at [facility] on 03/07/2013 revealed statistically decrease in bone density when compared to 03/05/2011: I have asked the patient to increase the calcium with vitamin D supplementation to 2 tablets daily. Will obtain insurance verification on initiation of bisphosphonate therapy  10#Fatigue slightly improved: TSH level is within normal range. I have asked the patient to follow up with her primary care physician for further evaluation  #11next mammogram is scheduled in October 2015   She knows to call for any problems that may develop before her next visit here.   Wilmon Arms , M.D. Medical oncology   06/03/2013

## 2013-06-06 DIAGNOSIS — H269 Unspecified cataract: Secondary | ICD-10-CM | POA: Diagnosis not present

## 2013-06-06 DIAGNOSIS — H2510 Age-related nuclear cataract, unspecified eye: Secondary | ICD-10-CM | POA: Diagnosis not present

## 2013-06-07 ENCOUNTER — Telehealth: Payer: Self-pay | Admitting: *Deleted

## 2013-06-07 NOTE — Telephone Encounter (Addendum)
Talked with Stacey [name]/Managed Care 06/05/13 to see if pt's insurance would cover zometa for osteopenia,on aromatase inhibitor, menopausal & breast cancer.  She reports that this should be OK.  Message was left yest for pt to call back to discuss zometa.  Talked with pt today about zometa & discussed side effects including osteonecrosis of jaw.  She reports that she has several implants & will probably need more soon.  She reports having a lot of teeth problems & also has many crowns & has had TMJ surgery & this scares her.  Informed that I would discuss with Dr Earnest Conroy & get back with her.  Per Dr Earnest Conroy, pt should have dental appt first & once cleared by dentist, could start zometa.  Informed pt & she states that she has an appt with her oral surgeon in next 2 wks & will discuss with him.

## 2013-06-14 ENCOUNTER — Other Ambulatory Visit: Payer: Self-pay | Admitting: Internal Medicine

## 2013-07-03 ENCOUNTER — Telehealth: Payer: Self-pay | Admitting: *Deleted

## 2013-07-03 NOTE — Telephone Encounter (Signed)
Message left by pt stating she was contacted post visit with Dr Earnest Conroy about benefit of receiving zometa.  Jennyfer has discussed above with her oral surgeon due to currently under therapy for dental implants. Per her discussion with the oral surgeon - she does not want to receive zometa.   This note will be given to MD for review with reading of last bone density and noted intake of calcium and vitamin d.  Per phone call Charline states she has increased her calcium intake to 2 tabs of 600mg  calcium with 400IU of D3 as well as D3 2000IU a day.

## 2013-08-26 ENCOUNTER — Other Ambulatory Visit: Payer: Self-pay | Admitting: Internal Medicine

## 2013-08-30 DIAGNOSIS — L280 Lichen simplex chronicus: Secondary | ICD-10-CM | POA: Diagnosis not present

## 2013-08-30 DIAGNOSIS — L821 Other seborrheic keratosis: Secondary | ICD-10-CM | POA: Diagnosis not present

## 2013-08-30 DIAGNOSIS — D239 Other benign neoplasm of skin, unspecified: Secondary | ICD-10-CM | POA: Diagnosis not present

## 2013-08-30 DIAGNOSIS — L819 Disorder of pigmentation, unspecified: Secondary | ICD-10-CM | POA: Diagnosis not present

## 2013-08-30 DIAGNOSIS — D1801 Hemangioma of skin and subcutaneous tissue: Secondary | ICD-10-CM | POA: Diagnosis not present

## 2013-09-21 ENCOUNTER — Encounter: Payer: Self-pay | Admitting: Internal Medicine

## 2013-10-26 ENCOUNTER — Other Ambulatory Visit: Payer: Self-pay | Admitting: Internal Medicine

## 2013-10-31 DIAGNOSIS — H40019 Open angle with borderline findings, low risk, unspecified eye: Secondary | ICD-10-CM | POA: Diagnosis not present

## 2013-11-08 ENCOUNTER — Encounter: Payer: Self-pay | Admitting: Internal Medicine

## 2013-11-15 DIAGNOSIS — Z23 Encounter for immunization: Secondary | ICD-10-CM | POA: Diagnosis not present

## 2013-11-17 ENCOUNTER — Other Ambulatory Visit: Payer: Self-pay

## 2013-11-20 DIAGNOSIS — Z853 Personal history of malignant neoplasm of breast: Secondary | ICD-10-CM | POA: Diagnosis not present

## 2013-11-20 DIAGNOSIS — R921 Mammographic calcification found on diagnostic imaging of breast: Secondary | ICD-10-CM | POA: Diagnosis not present

## 2013-11-20 LAB — HM MAMMOGRAPHY

## 2013-11-23 ENCOUNTER — Encounter: Payer: Self-pay | Admitting: Oncology

## 2013-11-30 ENCOUNTER — Other Ambulatory Visit: Payer: Self-pay | Admitting: Internal Medicine

## 2013-12-04 ENCOUNTER — Other Ambulatory Visit: Payer: Self-pay | Admitting: *Deleted

## 2013-12-04 DIAGNOSIS — C50211 Malignant neoplasm of upper-inner quadrant of right female breast: Secondary | ICD-10-CM

## 2013-12-05 ENCOUNTER — Other Ambulatory Visit (HOSPITAL_BASED_OUTPATIENT_CLINIC_OR_DEPARTMENT_OTHER): Payer: Medicare Other

## 2013-12-05 ENCOUNTER — Other Ambulatory Visit: Payer: Self-pay | Admitting: *Deleted

## 2013-12-05 DIAGNOSIS — C50211 Malignant neoplasm of upper-inner quadrant of right female breast: Secondary | ICD-10-CM

## 2013-12-05 LAB — COMPREHENSIVE METABOLIC PANEL (CC13)
ALT: 18 U/L (ref 0–55)
AST: 24 U/L (ref 5–34)
Albumin: 4 g/dL (ref 3.5–5.0)
Alkaline Phosphatase: 68 U/L (ref 40–150)
Anion Gap: 9 mEq/L (ref 3–11)
BUN: 17.6 mg/dL (ref 7.0–26.0)
CALCIUM: 9.9 mg/dL (ref 8.4–10.4)
CHLORIDE: 106 meq/L (ref 98–109)
CO2: 24 mEq/L (ref 22–29)
Creatinine: 0.9 mg/dL (ref 0.6–1.1)
Glucose: 109 mg/dl (ref 70–140)
POTASSIUM: 4.1 meq/L (ref 3.5–5.1)
SODIUM: 139 meq/L (ref 136–145)
TOTAL PROTEIN: 7.3 g/dL (ref 6.4–8.3)
Total Bilirubin: 0.29 mg/dL (ref 0.20–1.20)

## 2013-12-05 LAB — CBC WITH DIFFERENTIAL/PLATELET
BASO%: 0.5 % (ref 0.0–2.0)
Basophils Absolute: 0 10*3/uL (ref 0.0–0.1)
EOS%: 1 % (ref 0.0–7.0)
Eosinophils Absolute: 0.1 10*3/uL (ref 0.0–0.5)
HEMATOCRIT: 39.6 % (ref 34.8–46.6)
HGB: 12.9 g/dL (ref 11.6–15.9)
LYMPH%: 18.9 % (ref 14.0–49.7)
MCH: 29.3 pg (ref 25.1–34.0)
MCHC: 32.6 g/dL (ref 31.5–36.0)
MCV: 90 fL (ref 79.5–101.0)
MONO#: 0.6 10*3/uL (ref 0.1–0.9)
MONO%: 6.8 % (ref 0.0–14.0)
NEUT#: 6.1 10*3/uL (ref 1.5–6.5)
NEUT%: 72.8 % (ref 38.4–76.8)
Platelets: 309 10*3/uL (ref 145–400)
RBC: 4.4 10*6/uL (ref 3.70–5.45)
RDW: 13.2 % (ref 11.2–14.5)
WBC: 8.4 10*3/uL (ref 3.9–10.3)
lymph#: 1.6 10*3/uL (ref 0.9–3.3)

## 2013-12-06 ENCOUNTER — Ambulatory Visit: Payer: Medicare Other | Admitting: Internal Medicine

## 2013-12-12 ENCOUNTER — Telehealth: Payer: Self-pay | Admitting: Oncology

## 2013-12-12 ENCOUNTER — Ambulatory Visit (HOSPITAL_BASED_OUTPATIENT_CLINIC_OR_DEPARTMENT_OTHER): Payer: Medicare Other | Admitting: Oncology

## 2013-12-12 VITALS — BP 146/61 | HR 75 | Temp 98.2°F | Resp 20 | Ht 64.0 in | Wt 155.1 lb

## 2013-12-12 DIAGNOSIS — Z170 Estrogen receptor positive status [ER+]: Secondary | ICD-10-CM

## 2013-12-12 DIAGNOSIS — C50211 Malignant neoplasm of upper-inner quadrant of right female breast: Secondary | ICD-10-CM

## 2013-12-12 DIAGNOSIS — Z853 Personal history of malignant neoplasm of breast: Secondary | ICD-10-CM | POA: Diagnosis not present

## 2013-12-12 DIAGNOSIS — M8580 Other specified disorders of bone density and structure, unspecified site: Secondary | ICD-10-CM | POA: Diagnosis not present

## 2013-12-12 NOTE — Telephone Encounter (Signed)
per pof to sch pt appt-gave pt copy of sch °

## 2013-12-12 NOTE — Progress Notes (Signed)
ID: [name]   DOB: 05/29/1940  MR#: [id number]  HWY#:[id number]  PCP: Cathlean Cower, MD GYN:  Dr. Susette Racer SU:  Dr. Lucia Gaskins  OTHER MD:  HISTORY OF PRESENT ILLNESS:   From the original intake note:   Stacey [name] is a 73 year old [place] woman, who has a history of breast cancer starting in 1997 and in 2013.    On 11/22/95, she underwent a left breast biopsy, which revealed malignant cells.  She had a left breast lumpectomy with re-excision on 11/29/95, with final pathology revealed IDC, grade 2, 2.5 cm, ER 98%, PR 97%, Ki67 8% with one of 15 nodes being positive.  She received chemotherapy, 4 cycles per patient, along with radiation and took Tamoxifen for 7 years following radiation completion.  Records from 1997 limited.    In October 2012, she had a biopsy of the right breast after mammography recommended additional images for a dense area in the right breast.  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  On 11/25/10, she had an MRI that measured the area in the right breast as 2.2 cm.  She started neoadjuvant [name] in November 2012 and has continued it since.  The area of concern measured 1.7 cm on a repeat MRI in March of 2013.  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  Surgical margins were negative with one of one sentinel lymph node positive for metastatic carcinoma.  She then underwent radiation therapy under the care of Dr. [name] from 10/19/11 to 12/03/11.    Subsequent history is as detailed below  INTERVAL HISTORY:   Stacey [name] returns today for followup of her breast cancer. She continues on letrozole, which she is tolerating well: Hot flashes and vaginal dryness are not major issues. She does have some  thinning hair which may be related to that drug. She has also had a repeat bone density this year, which showed further bone density loss (from T of -1.62 years ago to -1.8 currently).  REVIEW OF SYSTEMS: She has had extensive dental work including multiple implants "and one more is coming". She has pain in the left temple area which can be lancinating. She denies unusual headaches otherwise. She does had bilateral cataract surgery. A detailed review of systems was otherwise noncontributory  PAST MEDICAL HISTORY: Past Medical History  Diagnosis Date  . ANEMIA-NOS   . ANXIETY   . COPD     resolved  . DIVERTICULOSIS, COLON 2008  . GERD   . HYPERLIPIDEMIA   . IRRITABLE BOWEL SYNDROME, HX OF   . OSTEOARTHRITIS, HAND   . Breast cancer 1997 L, 2012 R    s/p chemo/xrt  . Hx of radiation therapy 10/19/11 -12/03/11    right breast    PAST SURGICAL HISTORY: Past Surgical History  Procedure Laterality Date  . Appendectomy    . Tonsillectomy    . Rectal fissure repair    . Abdominal hysterectomy    . Inguinal herniorrhapy  1984    left  . Tmj arthroplasty  1989  . S/p lumpectomy  1997    melignant left x 2  . S/p benign breast biopsy  2003    right  . S/p left foot surgury  2009  . Spiral  fx left foot  2008    no surgury  . Breast surgery      lumpectomy  . Breast biopsy  11/14/10     r breast: inv, insitu mammary carcinoma w/calcif, er/pr +, her2 -  . Hernia repair      FAMILY HISTORY Family History  Problem Relation Age of Onset  . Alcohol abuse Mother     ETOH dependence  . Hypertension Mother   . Stroke Mother   . Colon polyps Mother   . Diabetes Mother   . Pancreatic cancer Mother 41  . Uterine cancer Mother 81  . Lymphoma Brother     burkitts  . Breast cancer Cousin     1 maternal, 2 paternal  . Lung cancer Paternal Uncle   . Lung cancer Maternal Grandmother 52    non-smoker  . Lung cancer Maternal Grandfather   . Breast cancer Cousin     maternal cousin,  dx in her mid 23s  . Brain cancer Cousin     maternal cousin's son; dx in his 51s  . Testicular cancer Cousin     maternal cousin's son;   . Breast cancer Cousin     paternal cousin; dx in her 58s    GYNECOLOGIC HISTORY:   Menarche age 22, GX P0 (one miscarriage at 35 months). Status post vaginal hysterectomy in the late 1970s, no salpingo-oophorectomy   SOCIAL HISTORY:  The patient lives alone and is divorced. She retired from [employer], were she worked in Physiological scientist.Marland Kitchen     ADVANCED DIRECTIVES:  Living will in place  HEALTH MAINTENANCE: History  Substance Use Topics  . Smoking status: Current Some Day Smoker -- 0.25 packs/day for 40 years    Types: Cigarettes  . Smokeless tobacco: Not on file  . Alcohol Use: Yes     Comment: rare/ drinks socially     Colonoscopy:  Repeat due DEC 2015  PAP:  S/p remote hysterectomy, no SO  Bone density:  FEB 2015: T - 1.8  Lipid panel:    Allergies  Allergen Reactions  . Clindamycin/Lincomycin Diarrhea and Nausea And Vomiting  . Codeine Nausea And Vomiting    Current Outpatient Prescriptions  Medication Sig Dispense Refill  . aspirin 81 MG EC tablet Take 81 mg by mouth daily.      . calcium-vitamin D (OSCAL WITH D) 500-200 MG-UNIT per tablet Take 1 tablet by mouth daily.    . CELEBREX 200 MG capsule take 1 by mouth capsule twice a day if needed for pain 60 capsule 3  . celecoxib (CELEBREX) 200 MG capsule Take 200 mg by mouth daily.    . cholecalciferol (VITAMIN D) 1000 UNITS tablet Take 1,000 Units by mouth daily.      . clotrimazole-betamethasone (LOTRISONE) cream Use as directed twice per day as needed 15 g 1  . diphenhydrAMINE (BENADRYL) 25 MG tablet Take 25 mg by mouth every 6 (six) hours as needed.    Marland Kitchen EPINEPHrine (EPIPEN) 0.3 mg/0.3 mL DEVI Use as directed     . fluticasone (FLONASE) 50 MCG/ACT nasal spray instill 2 sprays into each nostril once daily 16 g 3  . letrozole ([name]) 2.5 MG tablet Take 1 tablet (2.5 mg total) by  mouth daily. 90 tablet 11  . Multiple Vitamin (MULTIVITAMIN) capsule Take 1 capsule by mouth daily.      Marland Kitchen omeprazole (PRILOSEC) 20 MG capsule take 1 capsule by mouth once daily 30 capsule 1  . polyethylene glycol (MIRALAX / GLYCOLAX)  packet Take 17 g by mouth daily.    . polyethylene glycol powder (GLYCOLAX/MIRALAX) powder use as directed 527 g 0  . pseudoephedrine (SUDAFED) 30 MG tablet Take 30 mg by mouth daily.     Marland Kitchen zolpidem (AMBIEN) 10 MG tablet take 1 tablet by mouth at bedtime if needed 30 tablet 3   No current facility-administered medications for this visit.    OBJECTIVE: Older white woman in no acute distress Filed Vitals:   12/12/13 1502  BP: 146/61  Pulse: 75  Temp: 98.2 F (36.8 C)  Resp: 20     Body mass index is 26.61 kg/(m^2).    ECOG FS:  1  Sclerae unicteric, pupils equal and reactive Oropharynx clear, teeth under extensive repair No cervical or supraclavicular adenopathy Lungs no rales or rhonchi Heart regular rate and rhythm Abd soft, nontender, positive bowel sounds MSK no focal spinal tenderness, no upper extremity lymphedema Neuro: nonfocal, well oriented, appropriate affect Breasts: status post bilateral lumpectomies and radiation therapy to the left breast. There is no evidence of local recurrence. Both axillae are benign.   LAB RESULTS: Lab Results  Component Value Date   WBC 8.4 12/05/2013   NEUTROABS 6.1 12/05/2013   HGB 12.9 12/05/2013   HCT 39.6 12/05/2013   MCV 90.0 12/05/2013   PLT 309 12/05/2013      Chemistry      Component Value Date/Time   NA 139 12/05/2013 1436   NA 142 02/09/2013 1418   NA 144 03/06/2011 0840   K 4.1 12/05/2013 1436   K 4.1 02/09/2013 1418   K 4.0 03/06/2011 0840   CL 105 02/09/2013 1418   CL 107 02/02/2012 0939   CL 106 03/06/2011 0840   CO2 24 12/05/2013 1436   CO2 29 02/09/2013 1418   CO2 28 03/06/2011 0840   BUN 17.6 12/05/2013 1436   BUN 20 02/09/2013 1418   BUN 17 03/06/2011 0840   CREATININE  0.9 12/05/2013 1436   CREATININE 0.8 02/09/2013 1418   CREATININE 0.8 03/06/2011 0840      Component Value Date/Time   CALCIUM 9.9 12/05/2013 1436   CALCIUM 9.7 02/09/2013 1418   CALCIUM 8.5 03/06/2011 0840   ALKPHOS 68 12/05/2013 1436   ALKPHOS 62 09/12/2012 1612   ALKPHOS 47 03/06/2011 0840   AST 24 12/05/2013 1436   AST 24 09/12/2012 1612   AST 30 03/06/2011 0840   ALT 18 12/05/2013 1436   ALT 20 09/12/2012 1612   ALT 26 03/06/2011 0840   BILITOT 0.29 12/05/2013 1436   BILITOT 0.5 09/12/2012 1612   BILITOT 0.40 03/06/2011 0840       Lab Results  Component Value Date   LABCA2 56* 12/10/2010    No components found for: [id number]  No results for input(s): INR in the last 168 hours.  Urinalysis    Component Value Date/Time   COLORURINE YELLOW 02/09/2013 [place] 02/09/2013 0918   LABSPEC >=1.030* 02/09/2013 0918   PHURINE 5.5 02/09/2013 0918   GLUCOSEU NEGATIVE 02/09/2013 0918   HGBUR NEGATIVE 02/09/2013 0918   BILIRUBINUR SMALL* 02/09/2013 0918   KETONESUR NEGATIVE 02/09/2013 0918   UROBILINOGEN 0.2 02/09/2013 0918   NITRITE NEGATIVE 02/09/2013 0918   LEUKOCYTESUR NEGATIVE 02/09/2013 0918    STUDIES: Mammography at [facility] 11/20/2013 showed new microcalcifications which appeared benign; six-month repeat pending  ASSESSMENT: 73 y.o. Stacey [name] woman: #1  S/P LEFT breast lumpectomy with re-excision on 11/29/95 for a T2 N1bi IDC of the left breast,  grade 2, Stage IIB, ER 98%, PR 97%, Ki67 8%.  #2  She underwent 4 cycles of AC followed by radiation therapy under the care of Dr. [name].  #3  She took Tamoxifen for a total of seven years   #4  S/P biopsy of the RIGHT breast on 11/14/10 showing invasive ductal carcinoma,, grade 2, ER 85%, PR 57%, Ki67 20%, HER2 not amplified.    #5  She started neoadjuvant letrozole in November 2012   #6  S/P right lumpectomy with sentinel lymph node biopsy on 09/03/11 for a ypT2, ypN1a, stage IIB invasive  lobular carcinoma, grade 2,estrogen receptor 97% positive, progesterone receptor 12% positive, with no HER-2 amplification.  #7  She underwent radiation therapy under the care of Dr. [name] from 10/19/11 to 12/03/11.   #8 patient does not meet criteria for genetic testing according to her insurance company.  #9 osteopenia, with a T score of -1.8 on DEXA scan at [facility] 03/07/2013  (a) status post multiple dental extractions and implant-not a bisphosphonate candidate  PLAN:   Stacey [name] has seen a variety of physicians in the last few years so we took some time today, about 50 minutes, to review her situation. She has a history of bilateral breast cancers and a history of breast cancer in 2 cousins at 69 or younger.accordingly she meets NCCN criteria for BRCA testing. However her insurance company has refused to pay for this.today we again discussed the possibility of bilateral salpingo-oophorectomy but she would prefer to not proceed with this option. If she does she knows we have a gynecologist oncologist locally would be glad to perform the procedure for her.  We discussed the fact that letrozole worsens bone density problems, which are already present due to menopause. Most of the bone density loss due to aromatase inhibitors occurs in the first year.accordingly if we simply continue on letrozole and repeat a bone density in 2 years my guess is that the T score would be a T. -2.0 or perhaps -1.9.she has not a candidate for bisphosphonates or dentist so map given her extensive dental workup, which is ongoing.  The other option is to switch to tamoxifen. We have good data that after 2 years of letrozole, completing the 5 years with 3 years of tamoxifen produces equivalent results in terms of breast cancer recurrence. Of course tamoxifen increases bone density. She took tamoxifen previously and tolerated it well. She status post hysterectomy so concerns regarding endometrial carcinoma are not an  issue  After much discussion we decided she would complete the anastrozole she just bought, which is almost 3 more months, and then she will start tamoxifen. She is going to have a repeat mammogram in April 2 evaluate the microcalcifications recently noted, and she will see me late April of next year. If she is tolerating the tamoxifen well at that time I will start seeing her on a once a year basis until she completes her 5 years of follow-up here.  Stacey [name] has a good understanding of the overall plan. She agrees with it. She knows the goal of treatment in her case is cure.she will call with any problems that may develop before her next visit here. Stacey [name] C    12/12/2013

## 2013-12-15 ENCOUNTER — Ambulatory Visit (INDEPENDENT_AMBULATORY_CARE_PROVIDER_SITE_OTHER): Payer: Medicare Other | Admitting: Internal Medicine

## 2013-12-15 ENCOUNTER — Encounter: Payer: Self-pay | Admitting: Internal Medicine

## 2013-12-15 VITALS — BP 130/78 | HR 72 | Temp 98.4°F | Ht 64.0 in | Wt 156.0 lb

## 2013-12-15 DIAGNOSIS — Z23 Encounter for immunization: Secondary | ICD-10-CM | POA: Diagnosis not present

## 2013-12-15 DIAGNOSIS — F411 Generalized anxiety disorder: Secondary | ICD-10-CM

## 2013-12-15 DIAGNOSIS — D6489 Other specified anemias: Secondary | ICD-10-CM | POA: Diagnosis not present

## 2013-12-15 DIAGNOSIS — J438 Other emphysema: Secondary | ICD-10-CM | POA: Diagnosis not present

## 2013-12-15 DIAGNOSIS — E785 Hyperlipidemia, unspecified: Secondary | ICD-10-CM | POA: Diagnosis not present

## 2013-12-15 MED ORDER — MELOXICAM 15 MG PO TABS: 15.0000 mg | ORAL_TABLET | Freq: Every day | ORAL | Status: DC

## 2013-12-15 MED ORDER — POLYETHYLENE GLYCOL 3350 17 G PO PACK: 17.0000 g | PACK | Freq: Every day | ORAL | Status: DC

## 2013-12-15 NOTE — Patient Instructions (Addendum)
You had the new Prevnar pneumonia shot today  Please continue all other medications as before, and refills have been done if requested.  Please have the pharmacy call with any other refills you may need.  Please continue your efforts at being more active, low cholesterol diet, and weight control.  You are otherwise up to date with prevention measures today.  Please keep your appointments with your specialists as you may have planned  I think we can hold on further labs today  Please return in 1 year for your yearly visit, or sooner if needed

## 2013-12-15 NOTE — Progress Notes (Signed)
Subjective:    Patient ID: Stacey [name], female    DOB: 07-24-1940, 73 y.o.   MRN: [id number]  HPI  Here for yearly f/u;  Overall doing ok;  Pt denies CP, worsening SOB, DOE, wheezing, orthopnea, PND, worsening LE edema, palpitations, dizziness or syncope.  Pt denies neurological change such as new headache, facial or extremity weakness.  Pt denies polydipsia, polyuria, or low sugar symptoms. Pt states overall good compliance with treatment and medications, good tolerability, and has been trying to follow lower cholesterol diet.  Pt denies worsening depressive symptoms, suicidal ideation or panic. No fever, night sweats, wt loss, loss of appetite, or other constitutional symptoms.  Pt states good ability with ADL's, has low fall risk, home safety reviewed and adequate, no other significant changes in hearing or vision.  Sees oncology for breast ca.  Recent labs nov 3 without significant abnormal.   DXA 2015 with -1.8 t score, mild worse likely related to recent tx per pt per oncology. To start tamoxifen in jan 2016.  Celebrex no longer covered by insur - asks for change.   No other complaints. No recent overt bleeding Past Medical History  Diagnosis Date  . ANEMIA-NOS   . ANXIETY   . COPD     resolved  . DIVERTICULOSIS, COLON 2008  . GERD   . HYPERLIPIDEMIA   . IRRITABLE BOWEL SYNDROME, HX OF   . OSTEOARTHRITIS, HAND   . Breast cancer 1997 L, 2012 R    s/p chemo/xrt  . Hx of radiation therapy 10/19/11 -12/03/11    right breast   Past Surgical History  Procedure Laterality Date  . Appendectomy    . Tonsillectomy    . Rectal fissure repair    . Abdominal hysterectomy    . Inguinal herniorrhapy  1984    left  . Tmj arthroplasty  1989  . S/p lumpectomy  1997    melignant left x 2  . S/p benign breast biopsy  2003    right  . S/p left foot surgury  2009  . Spiral fx left foot  2008    no surgury  . Breast surgery      lumpectomy  . Breast biopsy  11/14/10     r breast: inv, insitu  mammary carcinoma w/calcif, er/pr +, her2 -  . Hernia repair      reports that she has been smoking Cigarettes.  She has a 10 pack-year smoking history. She does not have any smokeless tobacco history on file. She reports that she drinks alcohol. She reports that she does not use illicit drugs. family history includes Alcohol abuse in her mother; Brain cancer in her cousin; Breast cancer in her cousin, cousin, and cousin; Colon polyps in her mother; Diabetes in her mother; Hypertension in her mother; Lung cancer in her maternal grandfather and paternal uncle; Lung cancer (age of onset: 51) in her maternal grandmother; Lymphoma in her brother; Pancreatic cancer (age of onset: 7) in her mother; Stroke in her mother; Testicular cancer in her cousin; Uterine cancer (age of onset: 56) in her mother. Allergies  Allergen Reactions  . Clindamycin/Lincomycin Diarrhea and Nausea And Vomiting  . Codeine Nausea And Vomiting   Current Outpatient Prescriptions on File Prior to Visit  Medication Sig Dispense Refill  . aspirin 81 MG EC tablet Take 81 mg by mouth daily.      . calcium-vitamin D (OSCAL WITH D) 500-200 MG-UNIT per tablet Take 1 tablet by mouth daily.    Marland Kitchen  cholecalciferol (VITAMIN D) 1000 UNITS tablet Take 1,000 Units by mouth daily.      . clotrimazole-betamethasone (LOTRISONE) cream Use as directed twice per day as needed 15 g 1  . diphenhydrAMINE (BENADRYL) 25 MG tablet Take 25 mg by mouth every 6 (six) hours as needed.    . fluticasone (FLONASE) 50 MCG/ACT nasal spray instill 2 sprays into each nostril once daily 16 g 3  . letrozole ([name]) 2.5 MG tablet Take 1 tablet (2.5 mg total) by mouth daily. 90 tablet 11  . Multiple Vitamin (MULTIVITAMIN) capsule Take 1 capsule by mouth daily.      Marland Kitchen omeprazole (PRILOSEC) 20 MG capsule take 1 capsule by mouth once daily 30 capsule 1  . polyethylene glycol powder (GLYCOLAX/MIRALAX) powder use as directed 527 g 0  . pseudoephedrine (SUDAFED) 30 MG  tablet Take 30 mg by mouth daily.     Marland Kitchen zolpidem (AMBIEN) 10 MG tablet take 1 tablet by mouth at bedtime if needed 30 tablet 3   No current facility-administered medications on file prior to visit.   Review of Systems Constitutional: Negative for increased diaphoresis, other activity, appetite or other siginficant weight change  HENT: Negative for worsening hearing loss, ear pain, facial swelling, mouth sores and neck stiffness.   Eyes: Negative for other worsening pain, redness or visual disturbance.  Respiratory: Negative for shortness of breath and wheezing.   Cardiovascular: Negative for chest pain and palpitations.  Gastrointestinal: Negative for diarrhea, blood in stool, abdominal distention or other pain Genitourinary: Negative for hematuria, flank pain or change in urine volume.  Musculoskeletal: Negative for myalgias or other joint complaints.  Skin: Negative for color change and wound.  Neurological: Negative for syncope and numbness. other than noted Hematological: Negative for adenopathy. or other swelling Psychiatric/Behavioral: Negative for hallucinations, self-injury, decreased concentration or other worsening agitation.      Objective:   Physical Exam BP 130/78 mmHg  Pulse 72  Temp(Src) 98.4 F (36.9 C) (Oral)  Ht _0  (1.626 m)  Wt 156 lb (70.761 kg)  BMI 26.76 kg/m2  SpO2 96% VS noted,  Constitutional: Pt is oriented to person, place, and time. Appears well-developed and well-nourished.  Head: Normocephalic and atraumatic.  Right Ear: External ear normal.  Left Ear: External ear normal.  Nose: Nose normal.  Mouth/Throat: Oropharynx is clear and moist.  Eyes: Conjunctivae and EOM are normal. Pupils are equal, round, and reactive to light.  Neck: Normal range of motion. Neck supple. No JVD present. No tracheal deviation present.  Cardiovascular: Normal rate, regular rhythm, normal heart sounds and intact distal pulses.   Pulmonary/Chest: Effort normal and  breath sounds without rales or wheezing  Abdominal: Soft. Bowel sounds are normal. NT. No HSM  Musculoskeletal: Normal range of motion. Exhibits no edema.  Lymphadenopathy:  Has no cervical adenopathy.  Neurological: Pt is alert and oriented to person, place, and time. Pt has normal reflexes. No cranial nerve deficit. Motor grossly intact Skin: Skin is warm and dry. No rash noted.  Psychiatric:  Has mild nervous mood and affect. Behavior is normal.     Assessment & Plan:

## 2013-12-15 NOTE — Progress Notes (Signed)
Pre visit review using our clinic review tool, if applicable. No additional management support is needed unless otherwise documented below in the visit note. 

## 2013-12-16 NOTE — Assessment & Plan Note (Signed)
stable overall by history and exam, recent data reviewed with pt, and pt to continue medical treatment as before,  to f/u any worsening symptoms or concerns Lab Results  Component Value Date   WBC 8.4 12/05/2013   HGB 12.9 12/05/2013   HCT 39.6 12/05/2013   MCV 90.0 12/05/2013   PLT 309 12/05/2013

## 2013-12-16 NOTE — Assessment & Plan Note (Signed)
stable overall by history and exam, recent data reviewed with pt, and pt to continue medical treatment as before,  to f/u any worsening symptoms or concerns SpO2 Readings from Last 3 Encounters:  12/15/13 96%  02/09/13 95%  11/28/12 97%

## 2013-12-16 NOTE — Assessment & Plan Note (Signed)
stable overall by history and exam, recent data reviewed with pt, and pt to continue medical treatment as before,  to f/u any worsening symptoms or concerns Lab Results  Component Value Date   WBC 8.4 12/05/2013   HGB 12.9 12/05/2013   HCT 39.6 12/05/2013   PLT 309 12/05/2013   GLUCOSE 109 12/05/2013   CHOL 216* 09/12/2012   TRIG 202.0* 09/12/2012   HDL 71.50 09/12/2012   LDLDIRECT 129.2 09/12/2012   LDLCALC 84 07/04/2009   ALT 18 12/05/2013   AST 24 12/05/2013   NA 139 12/05/2013   K 4.1 12/05/2013   CL 105 02/09/2013   CREATININE 0.9 12/05/2013   BUN 17.6 12/05/2013   CO2 24 12/05/2013   TSH 1.323 05/22/2013

## 2013-12-16 NOTE — Assessment & Plan Note (Signed)
stable overall by history and exam, recent data reviewed with pt, and pt to continue medical treatment as before,  to f/u any worsening symptoms or concerns Lab Results  Component Value Date   LDLCALC 84 07/04/2009   Declines lab f/u, to cont lower chol diet

## 2013-12-19 ENCOUNTER — Ambulatory Visit (AMBULATORY_SURGERY_CENTER): Payer: Self-pay | Admitting: *Deleted

## 2013-12-19 VITALS — Ht 63.0 in | Wt 154.8 lb

## 2013-12-19 DIAGNOSIS — Z1211 Encounter for screening for malignant neoplasm of colon: Secondary | ICD-10-CM

## 2013-12-19 MED ORDER — MOVIPREP 100 G PO SOLR: ORAL | Status: DC

## 2013-12-19 NOTE — Progress Notes (Signed)
No egg or soy allergy  No anesthesia or intubation problems per pt  No diet medications taken  Registered in [place]  Pt states she has never had lymph nodes removed from right breast, so ok to use to IV/BPs

## 2013-12-24 ENCOUNTER — Other Ambulatory Visit: Payer: Self-pay | Admitting: Internal Medicine

## 2014-01-02 ENCOUNTER — Encounter: Payer: Self-pay | Admitting: Internal Medicine

## 2014-01-02 ENCOUNTER — Ambulatory Visit (AMBULATORY_SURGERY_CENTER): Payer: Medicare Other | Admitting: Internal Medicine

## 2014-01-02 VITALS — BP 126/55 | HR 71 | Temp 97.7°F | Resp 17 | Ht 63.0 in | Wt 154.0 lb

## 2014-01-02 DIAGNOSIS — Z853 Personal history of malignant neoplasm of breast: Secondary | ICD-10-CM | POA: Diagnosis not present

## 2014-01-02 DIAGNOSIS — D124 Benign neoplasm of descending colon: Secondary | ICD-10-CM

## 2014-01-02 DIAGNOSIS — Z1211 Encounter for screening for malignant neoplasm of colon: Secondary | ICD-10-CM

## 2014-01-02 MED ORDER — SODIUM CHLORIDE 0.9 % IV SOLN: 500.0000 mL | INTRAVENOUS | Status: DC

## 2014-01-02 NOTE — Patient Instructions (Signed)

## 2014-01-02 NOTE — Progress Notes (Signed)
Called to room to assist during endoscopic procedure.  Patient ID and intended procedure confirmed with present staff. Received instructions for my participation in the procedure from the performing physician.  

## 2014-01-02 NOTE — Progress Notes (Signed)
Procedure ends, to recovery, report givcen and VSS.

## 2014-01-02 NOTE — Op Note (Signed)
[place]  [address]. [place], [address]   COLONOSCOPY PROCEDURE REPORT  PATIENT: Stacey, [name]  MR#: [id number] BIRTHDATE: May 14, 1940 , 19  yrs. old GENDER: female ENDOSCOPIST: Eustace Quail, MD REFERRED [id number] Recall, PROCEDURE DATE:  01/02/2014 PROCEDURE:   Colonoscopy with snare polypectomy x 1 First Screening Colonoscopy - Avg.  risk and is 50 yrs.  old or older - No.  Prior Negative Screening - Now for repeat screening. 10 or more years since last screening  History of Adenoma - Now for follow-up colonoscopy & has been > or = to 3 yrs.  N/A  Polyps Removed Today? Yes. ASA CLASS:   Class II INDICATIONS:average risk for colorectal cancer.   Index exam 2005 (DRP) - negative for neoplasia MEDICATIONS: Monitored anesthesia care and Propofol 280 mg IV  DESCRIPTION OF PROCEDURE:   After the risks benefits and alternatives of the procedure were thoroughly explained, informed consent was obtained.  The digital rectal exam revealed no abnormalities of the rectum.   The LB [id number]-[id number]  endoscope was introduced through the anus and advanced to the cecum, which was identified by both the appendix and ileocecal valve. No adverse events experienced.   The quality of the prep was excellent, using MoviPrep  The instrument was then slowly withdrawn as the colon was fully examined.   COLON FINDINGS: A single polyp measuring 2 mm in size was found in the descending colon.  A polypectomy was performed with a cold snare.  The resection was complete, the polyp tissue was completely retrieved and sent to histology.   There was moderate diverticulosis noted in the left colon.   The examination was otherwise normal.  Retroflexed views revealed no abnormalities. The time to cecum=3 minutes 11 seconds.  Withdrawal time=12 minutes 39 seconds.  The scope was withdrawn and the procedure completed. COMPLICATIONS: There were no immediate complications.  ENDOSCOPIC  IMPRESSION: 1.   Single polyp measuring 2 mm in size was found in the descending colon; polypectomy was performed with a cold snare 2.   Moderate diverticulosis was noted in the left colon 3.   The examination was otherwise normal  RECOMMENDATIONS: 1. Return to the care of your primary provider.  GI follow up as needed  eSigned:  Eustace Quail, MD 01/02/2014 9:19 AM   cc: Biagio Borg, MD and The Patient

## 2014-01-03 ENCOUNTER — Telehealth: Payer: Self-pay

## 2014-01-03 NOTE — Telephone Encounter (Signed)
  Follow up Call-  Call back number 01/02/2014  Post procedure Call Back phone  # [phone]  Permission to leave phone message Yes     Patient questions:  Do you have a fever, pain , or abdominal swelling? No. Pain Score  0 *  Have you tolerated food without any problems? Yes.    Have you been able to return to your normal activities? Yes.    Do you have any questions about your discharge instructions: Diet   No. Medications  No. Follow up visit  No.  Do you have questions or concerns about your Care? No.  Actions: * If pain score is 4 or above: No action needed, pain <4.

## 2014-01-09 ENCOUNTER — Encounter: Payer: Self-pay | Admitting: Internal Medicine

## 2014-01-30 ENCOUNTER — Other Ambulatory Visit: Payer: Self-pay | Admitting: Internal Medicine

## 2014-01-30 NOTE — Telephone Encounter (Signed)
Done hardcopy to robin  

## 2014-01-30 NOTE — Telephone Encounter (Signed)
Faxed script to [facility]...Johny Chess

## 2014-02-23 ENCOUNTER — Telehealth: Payer: Self-pay | Admitting: Oncology

## 2014-02-23 NOTE — Telephone Encounter (Signed)
per Terri(nurse) to sch pt lab-pt aware of appt

## 2014-03-05 ENCOUNTER — Other Ambulatory Visit: Payer: Self-pay

## 2014-03-05 ENCOUNTER — Other Ambulatory Visit: Payer: Self-pay | Admitting: *Deleted

## 2014-03-05 DIAGNOSIS — C50211 Malignant neoplasm of upper-inner quadrant of right female breast: Secondary | ICD-10-CM

## 2014-03-05 MED ORDER — TAMOXIFEN CITRATE 20 MG PO TABS: 20.0000 mg | ORAL_TABLET | Freq: Every day | ORAL | Status: DC

## 2014-03-12 DIAGNOSIS — H3531 Nonexudative age-related macular degeneration: Secondary | ICD-10-CM | POA: Diagnosis not present

## 2014-03-12 DIAGNOSIS — H40013 Open angle with borderline findings, low risk, bilateral: Secondary | ICD-10-CM | POA: Diagnosis not present

## 2014-03-12 DIAGNOSIS — Z961 Presence of intraocular lens: Secondary | ICD-10-CM | POA: Diagnosis not present

## 2014-03-12 DIAGNOSIS — H26493 Other secondary cataract, bilateral: Secondary | ICD-10-CM | POA: Diagnosis not present

## 2014-03-23 NOTE — Telephone Encounter (Signed)
none

## 2014-04-08 ENCOUNTER — Other Ambulatory Visit: Payer: Self-pay | Admitting: Internal Medicine

## 2014-05-21 DIAGNOSIS — R920 Mammographic microcalcification found on diagnostic imaging of breast: Secondary | ICD-10-CM | POA: Diagnosis not present

## 2014-05-23 ENCOUNTER — Other Ambulatory Visit: Payer: Medicare Other

## 2014-05-30 ENCOUNTER — Other Ambulatory Visit (HOSPITAL_BASED_OUTPATIENT_CLINIC_OR_DEPARTMENT_OTHER): Payer: Medicare Other

## 2014-05-30 ENCOUNTER — Ambulatory Visit: Payer: Medicare Other | Admitting: Oncology

## 2014-05-30 DIAGNOSIS — C50211 Malignant neoplasm of upper-inner quadrant of right female breast: Secondary | ICD-10-CM | POA: Diagnosis present

## 2014-05-30 LAB — CBC WITH DIFFERENTIAL/PLATELET
BASO%: 0.3 % (ref 0.0–2.0)
Basophils Absolute: 0 10*3/uL (ref 0.0–0.1)
EOS%: 1.2 % (ref 0.0–7.0)
Eosinophils Absolute: 0.1 10*3/uL (ref 0.0–0.5)
HCT: 36.3 % (ref 34.8–46.6)
HGB: 12.4 g/dL (ref 11.6–15.9)
LYMPH#: 2 10*3/uL (ref 0.9–3.3)
LYMPH%: 25.6 % (ref 14.0–49.7)
MCH: 30.9 pg (ref 25.1–34.0)
MCHC: 34.2 g/dL (ref 31.5–36.0)
MCV: 90.5 fL (ref 79.5–101.0)
MONO#: 0.4 10*3/uL (ref 0.1–0.9)
MONO%: 5.3 % (ref 0.0–14.0)
NEUT#: 5.2 10*3/uL (ref 1.5–6.5)
NEUT%: 67.6 % (ref 38.4–76.8)
Platelets: 188 10*3/uL (ref 145–400)
RBC: 4.01 10*6/uL (ref 3.70–5.45)
RDW: 14 % (ref 11.2–14.5)
WBC: 7.7 10*3/uL (ref 3.9–10.3)

## 2014-05-30 LAB — COMPREHENSIVE METABOLIC PANEL (CC13)
ALK PHOS: 44 U/L (ref 40–150)
ALT: 14 U/L (ref 0–55)
AST: 26 U/L (ref 5–34)
Albumin: 3.5 g/dL (ref 3.5–5.0)
Anion Gap: 13 mEq/L — ABNORMAL HIGH (ref 3–11)
BUN: 16.8 mg/dL (ref 7.0–26.0)
CALCIUM: 8.6 mg/dL (ref 8.4–10.4)
CO2: 19 mEq/L — ABNORMAL LOW (ref 22–29)
Chloride: 110 mEq/L — ABNORMAL HIGH (ref 98–109)
Creatinine: 0.8 mg/dL (ref 0.6–1.1)
EGFR: 69 mL/min/{1.73_m2} — ABNORMAL LOW (ref >90)
Glucose: 116 mg/dl (ref 70–140)
Potassium: 4.2 mEq/L (ref 3.5–5.1)
SODIUM: 141 meq/L (ref 136–145)
Total Bilirubin: 0.21 mg/dL (ref 0.20–1.20)
Total Protein: 6.3 g/dL — ABNORMAL LOW (ref 6.4–8.3)

## 2014-06-02 ENCOUNTER — Emergency Department (INDEPENDENT_AMBULATORY_CARE_PROVIDER_SITE_OTHER)
Admission: EM | Admit: 2014-06-02 | Discharge: 2014-06-02 | Disposition: A | Discharge status: Home / Self Care | Payer: Medicare Other | Source: Home / Self Care | Attending: Family Medicine | Admitting: Family Medicine

## 2014-06-02 ENCOUNTER — Emergency Department (INDEPENDENT_AMBULATORY_CARE_PROVIDER_SITE_OTHER): Payer: Medicare Other

## 2014-06-02 ENCOUNTER — Encounter (HOSPITAL_COMMUNITY): Payer: Self-pay | Admitting: Emergency Medicine

## 2014-06-02 DIAGNOSIS — S99922A Unspecified injury of left foot, initial encounter: Secondary | ICD-10-CM | POA: Diagnosis not present

## 2014-06-02 DIAGNOSIS — M25551 Pain in right hip: Secondary | ICD-10-CM | POA: Diagnosis not present

## 2014-06-02 DIAGNOSIS — S7001XA Contusion of right hip, initial encounter: Secondary | ICD-10-CM

## 2014-06-02 DIAGNOSIS — W19XXXA Unspecified fall, initial encounter: Secondary | ICD-10-CM

## 2014-06-02 DIAGNOSIS — S93602A Unspecified sprain of left foot, initial encounter: Secondary | ICD-10-CM

## 2014-06-02 DIAGNOSIS — M19272 Secondary osteoarthritis, left ankle and foot: Secondary | ICD-10-CM | POA: Diagnosis not present

## 2014-06-02 DIAGNOSIS — M79672 Pain in left foot: Secondary | ICD-10-CM | POA: Diagnosis not present

## 2014-06-02 DIAGNOSIS — M19072 Primary osteoarthritis, left ankle and foot: Secondary | ICD-10-CM | POA: Diagnosis not present

## 2014-06-02 DIAGNOSIS — S79911A Unspecified injury of right hip, initial encounter: Secondary | ICD-10-CM | POA: Diagnosis not present

## 2014-06-02 MED ORDER — ACETAMINOPHEN 325 MG PO TABS
650.0000 mg | ORAL_TABLET | Freq: Once | ORAL | Status: AC
  Administered 2014-06-02: 650 mg via ORAL

## 2014-06-02 MED ORDER — ACETAMINOPHEN 325 MG PO TABS
ORAL_TABLET | ORAL | Status: AC
  Filled 2014-06-02: qty 2

## 2014-06-02 NOTE — ED Notes (Signed)
Pt sustained a fall today around 1030; fell in front porch of house Twisted left ankle and landed on right hip Swelling of left ankle Unable to bear wt due to pain Alert, no signs of acute distress.

## 2014-06-02 NOTE — Discharge Instructions (Signed)
The films of your right hip and left foot were both without evidence of new injury to the bones. You can expect to be stiff and sore for the next few days. Apply to the areas of discomfort and elevate to reduce swelling. Tylenol as directed on packaging for pain. If symptoms do not improve over the next 4-5 days, please follow up with [facility].  Contusion A contusion is a deep bruise. Contusions are the result of an injury that caused bleeding under the skin. The contusion may turn blue, purple, or yellow. Minor injuries will give you a painless contusion, but more severe contusions may stay painful and swollen for a few weeks.  CAUSES  A contusion is usually caused by a blow, trauma, or direct force to an area of the body. SYMPTOMS   Swelling and redness of the injured area.  Bruising of the injured area.  Tenderness and soreness of the injured area.  Pain. DIAGNOSIS  The diagnosis can be made by taking a history and physical exam. An X-ray, CT scan, or MRI may be needed to determine if there were any associated injuries, such as fractures. TREATMENT  Specific treatment will depend on what area of the body was injured. In general, the best treatment for a contusion is resting, icing, elevating, and applying cold compresses to the injured area. Over-the-counter medicines may also be recommended for pain control. Ask your caregiver what the best treatment is for your contusion. HOME CARE INSTRUCTIONS   Put ice on the injured area.  Put ice in a plastic bag.  Place a towel between your skin and the bag.  Leave the ice on for 15-20 minutes, 3-4 times a day, or as directed by your health care provider.  Only take over-the-counter or prescription medicines for pain, discomfort, or fever as directed by your caregiver. Your caregiver may recommend avoiding anti-inflammatory medicines (aspirin, ibuprofen, and naproxen) for 48 hours because these medicines may increase  bruising.  Rest the injured area.  If possible, elevate the injured area to reduce swelling. SEEK IMMEDIATE MEDICAL CARE IF:   You have increased bruising or swelling.  You have pain that is getting worse.  Your swelling or pain is not relieved with medicines. MAKE SURE YOU:   Understand these instructions.  Will watch your condition.  Will get help right away if you are not doing well or get worse. Document Released: 10/29/2004 Document Revised: 01/24/2013 Document Reviewed: 11/24/2010 [facility] Patient Information 2015 [place], [place]. This information is not intended to replace advice given to you by your health care provider. Make sure you discuss any questions you have with your health care provider.

## 2014-06-02 NOTE — ED Provider Notes (Signed)
CSN: [id number]     Arrival date & time 06/02/14  1416 History   First MD Initiated Contact with Patient 06/02/14 1540     Chief Complaint  Patient presents with  . Fall   (Consider location/radiation/quality/duration/timing/severity/associated sxs/prior Treatment) HPI Comments: Patient states she slipped and fell down a step coming off of her front porch this morning while trying to go get her newspaper. Twisted her left foot and landed on her right hip. States both areas have remained uncomfortable with weight bearing and ambulation since fall. Denies head trauma.   Patient is a 74 y.o. female presenting with fall. The history is provided by the patient.  Fall This is a new problem.    Past Medical History  Diagnosis Date  . ANEMIA-NOS   . ANXIETY   . COPD     resolved  . DIVERTICULOSIS, COLON 2008  . GERD   . HYPERLIPIDEMIA   . IRRITABLE BOWEL SYNDROME, HX OF   . OSTEOARTHRITIS, HAND   . Hx of radiation therapy 10/19/11 -12/03/11    right breast  . Breast cancer 1997 L, 2012 R    s/p chemo/xrt   Past Surgical History  Procedure Laterality Date  . Appendectomy    . Tonsillectomy    . Rectal fissure repair    . Abdominal hysterectomy    . Inguinal herniorrhapy  1984    left  . Tmj arthroplasty  1989  . S/p lumpectomy  1997    melignant left x 2  . S/p benign breast biopsy  2003    right  . S/p left foot surgury  2009  . Spiral fx left foot  2008    no surgury  . Breast surgery      lumpectomy  . Breast biopsy  11/14/10     r breast: inv, insitu mammary carcinoma w/calcif, er/pr +, her2 -  . Hernia repair    . Tongue surgery      1988- to remove scar tissue growth   . Cataract extraction      both eyes   Family History  Problem Relation Age of Onset  . Alcohol abuse Mother     ETOH dependence  . Hypertension Mother   . Stroke Mother   . Colon polyps Mother   . Diabetes Mother   . Pancreatic cancer Mother 61  . Uterine cancer Mother 36  . Lymphoma  Brother     burkitts  . Breast cancer Cousin     1 maternal, 2 paternal  . Lung cancer Paternal Uncle   . Lung cancer Maternal Grandmother 18    non-smoker  . Lung cancer Maternal Grandfather   . Breast cancer Cousin     maternal cousin, dx in her mid 56s  . Brain cancer Cousin     maternal cousin's son; dx in his 95s  . Testicular cancer Cousin     maternal cousin's son;   . Breast cancer Cousin     paternal cousin; dx in her 70s  . Colon cancer Neg Hx   . Esophageal cancer Neg Hx   . Stomach cancer Neg Hx   . Rectal cancer Neg Hx    History  Substance Use Topics  . Smoking status: Current Some Day Smoker -- 0.25 packs/day for 40 years    Types: Cigarettes  . Smokeless tobacco: Never Used  . Alcohol Use: Yes     Comment: rare/ drinks socially   OB History    No data available  Review of Systems  All other systems reviewed and are negative.   Allergies  Clindamycin/lincomycin and Codeine  Home Medications   Prior to Admission medications   Medication Sig Start Date End Date Taking? Authorizing Provider  calcium-vitamin D (OSCAL WITH D) 500-200 MG-UNIT per tablet Take 1 tablet by mouth daily.   Yes Historical Provider, MD  cholecalciferol (VITAMIN D) 1000 UNITS tablet Take 1,000 Units by mouth daily.     Yes Historical Provider, MD  aspirin 81 MG EC tablet Take 81 mg by mouth daily.      Historical Provider, MD  clotrimazole-betamethasone (LOTRISONE) cream Use as directed twice per day as needed 09/13/12   Biagio Borg, MD  diphenhydrAMINE (BENADRYL) 25 MG tablet Take 25 mg by mouth every 6 (six) hours as needed.    Historical Provider, MD  fluticasone [facility]encion [facility]) 50 MCG/ACT nasal spray instill 2 sprays into each nostril once daily 04/09/14   Biagio Borg, MD  letrozole [facility]) 2.5 MG tablet Take 1 tablet (2.5 mg total) by mouth daily. 11/28/12   Chauncey Cruel, MD  meloxicam (MOBIC) 15 MG tablet Take 1 tablet (15 mg total) by mouth daily. As needed 12/15/13    Biagio Borg, MD  Multiple Vitamin (MULTIVITAMIN) capsule Take 1 capsule by mouth daily.      Historical Provider, MD  omeprazole (PRILOSEC) 20 MG capsule take 1 capsule by mouth once daily 12/25/13   Biagio Borg, MD  polyethylene glycol powder [facility]) powder use as directed Patient not taking: Reported on 01/02/2014    Biagio Borg, MD  pseudoephedrine (SUDAFED) 30 MG tablet Take 30 mg by mouth daily.     Historical Provider, MD  tamoxifen (NOLVADEX) 20 MG tablet Take 1 tablet (20 mg total) by mouth daily. 03/05/14   Chauncey Cruel, MD  zolpidem (AMBIEN) 10 MG tablet take 1 tablet by mouth at bedtime if needed 01/30/14   Biagio Borg, MD   BP 134/68 mmHg  Pulse 76  Temp(Src) 97.9 F (36.6 C) (Oral)  Resp 18  SpO2 97% Physical Exam  Constitutional: She is oriented to person, place, and time. She appears well-developed and well-nourished.  HENT:  Head: Normocephalic and atraumatic.  Eyes: Conjunctivae are normal.  Neck: Normal range of motion. Neck supple.  Cardiovascular: Normal rate, regular rhythm and normal heart sounds.   Pulmonary/Chest: Effort normal and breath sounds normal.  Musculoskeletal:       Right hip: She exhibits tenderness. She exhibits normal range of motion, normal strength, no bony tenderness, no swelling, no crepitus, no deformity and no laceration.       Legs:      Left foot: There is tenderness. There is normal range of motion, no bony tenderness, no swelling, normal capillary refill, no crepitus, no deformity and no laceration.       Feet:  Neurological: She is alert and oriented to person, place, and time.  Skin: Skin is warm and dry.  +intact  Psychiatric: She has a normal mood and affect. Her behavior is normal.  Nursing note and vitals reviewed.   ED Course  Procedures (including critical care time) Labs Review Labs Reviewed - No data to display  Imaging Review Dg Foot Complete Left  06/02/2014   CLINICAL DATA:  Fall, left foot pain,  prior fracture and surgery  EXAM: LEFT FOOT - COMPLETE 3+ VIEW  COMPARISON:  None.  FINDINGS: No evidence of acute fracture or dislocation.  Degenerative changes involving the 1st  MTP joint, 2nd and 3rd PIP joints, and 4th DIP joint.  Postsurgical changes involving the second and 3rd metatarsal heads.  Posttraumatic deformity involving the distal 4th and 5th metatarsals.  Visualized soft tissues are within normal limits.  IMPRESSION: No evidence of fracture or dislocation.  Degenerative, postsurgical, and posttraumatic changes, as above.   Electronically Signed   By: Julian Hy M.D.   On: 06/02/2014 16:42   Dg Hip Unilat With Pelvis 2-3 Views Right  06/02/2014   CLINICAL DATA:  Fall, right hip pain  EXAM: RIGHT HIP (WITH PELVIS) 2-3 VIEWS  COMPARISON:  None.  FINDINGS: No fracture or dislocation is seen.  Bilateral hip joint spaces are symmetric.  Visualized bony pelvis appears intact.  IMPRESSION: No fracture or dislocation is seen.   Electronically Signed   By: Julian Hy M.D.   On: 06/02/2014 16:45     MDM   1. Contusion of right hip, initial encounter   2. Fall   3. Sprain of left foot, initial encounter   Films negative for acute injury. Ice, elevation and tylenol. Expect improvement over the next few days. If no improvement, follow up with [facility].     Lutricia Feil, [place] 06/02/14 [phone]

## 2014-06-06 ENCOUNTER — Ambulatory Visit (HOSPITAL_BASED_OUTPATIENT_CLINIC_OR_DEPARTMENT_OTHER): Payer: Medicare Other | Admitting: Oncology

## 2014-06-06 ENCOUNTER — Telehealth: Payer: Self-pay | Admitting: Oncology

## 2014-06-06 VITALS — BP 156/71 | HR 100 | Temp 98.1°F | Resp 18 | Ht 63.0 in | Wt 152.2 lb

## 2014-06-06 DIAGNOSIS — R109 Unspecified abdominal pain: Secondary | ICD-10-CM

## 2014-06-06 DIAGNOSIS — J42 Unspecified chronic bronchitis: Secondary | ICD-10-CM

## 2014-06-06 DIAGNOSIS — C50211 Malignant neoplasm of upper-inner quadrant of right female breast: Secondary | ICD-10-CM

## 2014-06-06 DIAGNOSIS — Z853 Personal history of malignant neoplasm of breast: Secondary | ICD-10-CM

## 2014-06-06 DIAGNOSIS — E785 Hyperlipidemia, unspecified: Secondary | ICD-10-CM

## 2014-06-06 DIAGNOSIS — M19041 Primary osteoarthritis, right hand: Secondary | ICD-10-CM

## 2014-06-06 DIAGNOSIS — N393 Stress incontinence (female) (male): Secondary | ICD-10-CM | POA: Diagnosis not present

## 2014-06-06 DIAGNOSIS — M8580 Other specified disorders of bone density and structure, unspecified site: Secondary | ICD-10-CM

## 2014-06-06 DIAGNOSIS — D630 Anemia in neoplastic disease: Secondary | ICD-10-CM

## 2014-06-06 NOTE — Progress Notes (Signed)
ID: [name]   DOB: 1940/04/29  MR#: [id number]  CXK#:[id number]  PCP: Cathlean Cower, MD GYN:  Dr. Susette Racer SU:  Dr. Lucia Gaskins  OTHER MD:  HISTORY OF PRESENT ILLNESS:   From the original intake note:   Stacey [name] is a 74 year old [place] woman, who has a history of breast cancer starting in 1997 and in 2013.    On 11/22/95, she underwent a left breast biopsy, which revealed malignant cells.  She had a left breast lumpectomy with re-excision on 11/29/95, with final pathology revealed IDC, grade 2, 2.5 cm, ER 98%, PR 97%, Ki67 8% with one of 15 nodes being positive.  She received chemotherapy, 4 cycles per patient, along with radiation and took Tamoxifen for 7 years following radiation completion.  Records from 1997 limited.    In October 2012, she had a biopsy of the right breast after mammography recommended additional images for a dense area in the right breast.  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  On 11/25/10, she had an MRI that measured the area in the right breast as 2.2 cm.  She started neoadjuvant [name] in November 2012 and has continued it since.  The area of concern measured 1.7 cm on a repeat MRI in March of 2013.  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  Surgical margins were negative with one of one sentinel lymph node positive for metastatic carcinoma.  She then underwent radiation therapy under the care of Dr. [name] from 10/19/11 to 12/03/11.    Subsequent history is as detailed below  INTERVAL HISTORY:   Stacey [name] returns today for followup of her breast cancer. After her November visit we switched her to tamoxifen but since she had 3 months worth of letrozole she actually did not start that  until February 2016. She is tolerating that well, with no significant side effects to report and particularly only mild hot flashes.  On the other hand she slipped on her porch and fell, spraining her left ankle and injuring her right hip. She was evaluated at urgent care and the films thankfully showed no evidence of fracture. However she has a lot of pain in her right hip area (really more the flank in the hip) and is walking with a cane currently.  REVIEW OF SYSTEMS: Aside from the fall issue, she is a little bit short of breath when walking up stairs. She has heartburn. She has stress urinary incontinence. A detailed review of systems so was otherwise entirely stable  PAST MEDICAL HISTORY: Past Medical History  Diagnosis Date  . ANEMIA-NOS   . ANXIETY   . COPD     resolved  . DIVERTICULOSIS, COLON 2008  . GERD   . HYPERLIPIDEMIA   . IRRITABLE BOWEL SYNDROME, HX OF   . OSTEOARTHRITIS, HAND   . Hx of radiation therapy 10/19/11 -12/03/11    right breast  . Breast cancer 1997 L, 2012 R    s/p chemo/xrt    PAST SURGICAL HISTORY: Past Surgical History  Procedure Laterality Date  . Appendectomy    . Tonsillectomy    . Rectal fissure repair    . Abdominal hysterectomy    . Inguinal herniorrhapy  1984    left  .  Tmj arthroplasty  1989  . S/p lumpectomy  1997    melignant left x 2  . S/p benign breast biopsy  2003    right  . S/p left foot surgury  2009  . Spiral fx left foot  2008    no surgury  . Breast surgery      lumpectomy  . Breast biopsy  11/14/10     r breast: inv, insitu mammary carcinoma w/calcif, er/pr +, her2 -  . Hernia repair    . Tongue surgery      1988- to remove scar tissue growth   . Cataract extraction      both eyes    FAMILY HISTORY Family History  Problem Relation Age of Onset  . Alcohol abuse Mother     ETOH dependence  . Hypertension Mother   . Stroke Mother   . Colon polyps Mother   . Diabetes Mother   . Pancreatic cancer Mother 64  .  Uterine cancer Mother 63  . Lymphoma Brother     burkitts  . Breast cancer Cousin     1 maternal, 2 paternal  . Lung cancer Paternal Uncle   . Lung cancer Maternal Grandmother 34    non-smoker  . Lung cancer Maternal Grandfather   . Breast cancer Cousin     maternal cousin, dx in her mid 74s  . Brain cancer Cousin     maternal cousin's son; dx in his 39s  . Testicular cancer Cousin     maternal cousin's son;   . Breast cancer Cousin     paternal cousin; dx in her 32s  . Colon cancer Neg Hx   . Esophageal cancer Neg Hx   . Stomach cancer Neg Hx   . Rectal cancer Neg Hx     GYNECOLOGIC HISTORY:   Menarche age 21, GX P0 (one miscarriage at 5 months). Status post vaginal hysterectomy in the late 1970s, no salpingo-oophorectomy   SOCIAL HISTORY:  The patient lives alone and is divorced. She retired from [employer], were she worked in Physiological scientist.Marland Kitchen     ADVANCED DIRECTIVES:  Living will in place  HEALTH MAINTENANCE: History  Substance Use Topics  . Smoking status: Current Some Day Smoker -- 0.25 packs/day for 40 years    Types: Cigarettes  . Smokeless tobacco: Never Used  . Alcohol Use: Yes     Comment: rare/ drinks socially     Colonoscopy:  Repeat due DEC 2015  PAP:  S/p remote hysterectomy, no SO  Bone density:  FEB 2015: T - 1.8  Lipid panel:    Allergies  Allergen Reactions  . Clindamycin/Lincomycin Diarrhea and Nausea And Vomiting  . Codeine Nausea And Vomiting    Current Outpatient Prescriptions  Medication Sig Dispense Refill  . aspirin 81 MG EC tablet Take 81 mg by mouth daily.      . calcium-vitamin D (OSCAL WITH D) 500-200 MG-UNIT per tablet Take 1 tablet by mouth daily.    . cholecalciferol (VITAMIN D) 1000 UNITS tablet Take 1,000 Units by mouth daily.      . clotrimazole-betamethasone (LOTRISONE) cream Use as directed twice per day as needed 15 g 1  . diphenhydrAMINE (BENADRYL) 25 MG tablet Take 25 mg by mouth every 6 (six) hours as needed.    .  fluticasone (FLONASE) 50 MCG/ACT nasal spray instill 2 sprays into each nostril once daily 16 g 3  . letrozole ([name]) 2.5 MG tablet Take 1 tablet (2.5 mg total) by mouth  daily. 90 tablet 11  . meloxicam (MOBIC) 15 MG tablet Take 1 tablet (15 mg total) by mouth daily. As needed 90 tablet 3  . Multiple Vitamin (MULTIVITAMIN) capsule Take 1 capsule by mouth daily.      Marland Kitchen omeprazole (PRILOSEC) 20 MG capsule take 1 capsule by mouth once daily 30 capsule 5  . polyethylene glycol powder (GLYCOLAX/MIRALAX) powder use as directed (Patient not taking: Reported on 01/02/2014) 527 g 0  . pseudoephedrine (SUDAFED) 30 MG tablet Take 30 mg by mouth daily.     . tamoxifen (NOLVADEX) 20 MG tablet Take 1 tablet (20 mg total) by mouth daily. 90 tablet 1  . zolpidem (AMBIEN) 10 MG tablet take 1 tablet by mouth at bedtime if needed 30 tablet 3   No current facility-administered medications for this visit.    OBJECTIVE: Older white woman examined in a chair Filed Vitals:   06/06/14 1403  BP: 156/71  Pulse: 100  Temp: 98.1 F (36.7 C)  Resp: 18     Body mass index is 26.97 kg/(m^2).    ECOG FS:  2  Sclerae unicteric, EOMs intact Oropharynx clear and moist No cervical or supraclavicular adenopathy Lungs no rales or rhonchi Heart regular rate and rhythm Abd soft, nontender, positive bowel sounds MSK no focal spinal tenderness, no upper extremity lymphedema, severe pain in the right flank area where there is however no palpable mass Neuro: nonfocal, well oriented, positive affect Breasts: status post bilateral lumpectomies and status post radiation therapy to the left breast. There is no evidence of local recurrence on either side. Both axillae are benign.   LAB RESULTS: Lab Results  Component Value Date   WBC 7.7 05/30/2014   NEUTROABS 5.2 05/30/2014   HGB 12.4 05/30/2014   HCT 36.3 05/30/2014   MCV 90.5 05/30/2014   PLT 188 05/30/2014      Chemistry      Component Value Date/Time   NA 141  05/30/2014 1509   NA 142 02/09/2013 1418   NA 144 03/06/2011 0840   K 4.2 05/30/2014 1509   K 4.1 02/09/2013 1418   K 4.0 03/06/2011 0840   CL 105 02/09/2013 1418   CL 107 02/02/2012 0939   CL 106 03/06/2011 0840   CO2 19* 05/30/2014 1509   CO2 29 02/09/2013 1418   CO2 28 03/06/2011 0840   BUN 16.8 05/30/2014 1509   BUN 20 02/09/2013 1418   BUN 17 03/06/2011 0840   CREATININE 0.8 05/30/2014 1509   CREATININE 0.8 02/09/2013 1418   CREATININE 0.8 03/06/2011 0840      Component Value Date/Time   CALCIUM 8.6 05/30/2014 1509   CALCIUM 9.7 02/09/2013 1418   CALCIUM 8.5 03/06/2011 0840   ALKPHOS 44 05/30/2014 1509   ALKPHOS 62 09/12/2012 1612   ALKPHOS 47 03/06/2011 0840   AST 26 05/30/2014 1509   AST 24 09/12/2012 1612   AST 30 03/06/2011 0840   ALT 14 05/30/2014 1509   ALT 20 09/12/2012 1612   ALT 26 03/06/2011 0840   BILITOT 0.21 05/30/2014 1509   BILITOT 0.5 09/12/2012 1612   BILITOT 0.40 03/06/2011 0840       Lab Results  Component Value Date   LABCA2 56* 12/10/2010    No components found for: [id number]  No results for input(s): INR in the last 168 hours.  Urinalysis    Component Value Date/Time   COLORURINE YELLOW 02/09/2013 [place] 02/09/2013 0918   LABSPEC >=1.030* 02/09/2013 7824  PHURINE 5.5 02/09/2013 [place] 02/09/2013 0918   HGBUR NEGATIVE 02/09/2013 0918   BILIRUBINUR SMALL* 02/09/2013 0918   KETONESUR NEGATIVE 02/09/2013 0918   UROBILINOGEN 0.2 02/09/2013 0918   NITRITE NEGATIVE 02/09/2013 0918   LEUKOCYTESUR NEGATIVE 02/09/2013 0918    STUDIES: Mammography at [facility] 11/20/2013 showed new microcalcifications which appeared benign; six-month repeat pending. Right hip and left ankle films showed no fracture  ASSESSMENT: 74 y.o. [place] woman: #1  S/P LEFT breast lumpectomy with re-excision on 11/29/95 for a T2 N1bi IDC of the left breast, grade 2, Stage IIB, ER 98%, PR 97%, Ki67 8%.  #2  She underwent 4  cycles of AC followed by radiation therapy under the care of Dr. [name].  #3  She took Tamoxifen for a total of seven years   #4  S/P biopsy of the RIGHT breast on 11/14/10 showing invasive ductal carcinoma,, grade 2, ER 85%, PR 57%, Ki67 20%, HER2 not amplified.    #5  She started neoadjuvant letrozole in November 2012; switched to tamoxifen as of February 2016 due to osteopenia concerns  #6  S/P right lumpectomy with sentinel lymph node biopsy on 09/03/11 for a ypT2, ypN1a, stage IIB invasive lobular carcinoma, grade 2,estrogen receptor 97% positive, progesterone receptor 12% positive, with no HER-2 amplification.  #7  She underwent radiation therapy under the care of Dr. [name] from 10/19/11 to 12/03/11.   #8 patient does not meet criteria for genetic testing according to her insurance company.  #9 osteopenia, with a T score of -1.8 on DEXA scan at [facility] 03/07/2013  (a) status post multiple dental extractions and implant-not a bisphosphonate candidate  PLAN:   Stacey [name] is doing well from a breast cancer point of view. We are going to see her again in November and she will meet our survivorship advanced practice provider at that time. She will then see me one last time November 2017. She will complete her 5 years of antiestrogen therapy then she will then tell me whether she would like to continue to participate in our survivorship program or "get out of the cancer business".  I suggested she try some cold compresses to the right flank discomfort. She had recently seen Dr. French Ana and would like him to evaluate her further. I have gone ahead and placed that referral for her.  Stacey [name] has a good understanding of the overall plan. She agrees with it. She will call  with any problems that may develop before her next visit here. MAGRINAT,GUSTAV C    06/06/2014

## 2014-06-06 NOTE — Telephone Encounter (Signed)
Gave avs & calendar for November. See referral notes.

## 2014-06-07 DIAGNOSIS — S335XXA Sprain of ligaments of lumbar spine, initial encounter: Secondary | ICD-10-CM | POA: Diagnosis not present

## 2014-06-07 DIAGNOSIS — M25572 Pain in left ankle and joints of left foot: Secondary | ICD-10-CM | POA: Diagnosis not present

## 2014-06-12 ENCOUNTER — Other Ambulatory Visit: Payer: Self-pay | Admitting: Internal Medicine

## 2014-06-19 DIAGNOSIS — M6281 Muscle weakness (generalized): Secondary | ICD-10-CM | POA: Diagnosis not present

## 2014-06-19 DIAGNOSIS — M545 Low back pain: Secondary | ICD-10-CM | POA: Diagnosis not present

## 2014-06-21 DIAGNOSIS — M6281 Muscle weakness (generalized): Secondary | ICD-10-CM | POA: Diagnosis not present

## 2014-06-21 DIAGNOSIS — M545 Low back pain: Secondary | ICD-10-CM | POA: Diagnosis not present

## 2014-06-25 DIAGNOSIS — M25572 Pain in left ankle and joints of left foot: Secondary | ICD-10-CM | POA: Diagnosis not present

## 2014-06-26 DIAGNOSIS — M6281 Muscle weakness (generalized): Secondary | ICD-10-CM | POA: Diagnosis not present

## 2014-06-26 DIAGNOSIS — M545 Low back pain: Secondary | ICD-10-CM | POA: Diagnosis not present

## 2014-06-28 DIAGNOSIS — M545 Low back pain: Secondary | ICD-10-CM | POA: Diagnosis not present

## 2014-06-28 DIAGNOSIS — M6281 Muscle weakness (generalized): Secondary | ICD-10-CM | POA: Diagnosis not present

## 2014-07-04 DIAGNOSIS — M25572 Pain in left ankle and joints of left foot: Secondary | ICD-10-CM | POA: Diagnosis not present

## 2014-07-04 DIAGNOSIS — S93402D Sprain of unspecified ligament of left ankle, subsequent encounter: Secondary | ICD-10-CM | POA: Diagnosis not present

## 2014-08-27 ENCOUNTER — Other Ambulatory Visit: Payer: Self-pay | Admitting: *Deleted

## 2014-08-27 DIAGNOSIS — C50211 Malignant neoplasm of upper-inner quadrant of right female breast: Secondary | ICD-10-CM

## 2014-08-27 MED ORDER — TAMOXIFEN CITRATE 20 MG PO TABS: 20.0000 mg | ORAL_TABLET | Freq: Every day | ORAL | Status: DC

## 2014-09-04 DIAGNOSIS — D1800 Hemangioma unspecified site: Secondary | ICD-10-CM | POA: Diagnosis not present

## 2014-09-04 DIAGNOSIS — L589 Radiodermatitis, unspecified: Secondary | ICD-10-CM | POA: Diagnosis not present

## 2014-09-04 DIAGNOSIS — D225 Melanocytic nevi of trunk: Secondary | ICD-10-CM | POA: Diagnosis not present

## 2014-09-04 DIAGNOSIS — Z86018 Personal history of other benign neoplasm: Secondary | ICD-10-CM | POA: Diagnosis not present

## 2014-09-04 DIAGNOSIS — L821 Other seborrheic keratosis: Secondary | ICD-10-CM | POA: Diagnosis not present

## 2014-09-04 DIAGNOSIS — L814 Other melanin hyperpigmentation: Secondary | ICD-10-CM | POA: Diagnosis not present

## 2014-09-05 ENCOUNTER — Other Ambulatory Visit: Payer: Self-pay | Admitting: Internal Medicine

## 2014-09-05 NOTE — Telephone Encounter (Signed)
Rx faxed to pharmacy  

## 2014-09-05 NOTE — Telephone Encounter (Signed)
Done hardcopy to Dahlia  

## 2014-11-16 ENCOUNTER — Encounter: Payer: Self-pay | Admitting: *Deleted

## 2014-11-28 DIAGNOSIS — Z853 Personal history of malignant neoplasm of breast: Secondary | ICD-10-CM | POA: Diagnosis not present

## 2014-11-28 DIAGNOSIS — Z23 Encounter for immunization: Secondary | ICD-10-CM | POA: Diagnosis not present

## 2014-11-28 DIAGNOSIS — R921 Mammographic calcification found on diagnostic imaging of breast: Secondary | ICD-10-CM | POA: Diagnosis not present

## 2014-11-28 LAB — HM MAMMOGRAPHY: HM MAMMO: NEGATIVE

## 2014-11-30 ENCOUNTER — Encounter: Payer: Self-pay | Admitting: Internal Medicine

## 2014-12-01 ENCOUNTER — Other Ambulatory Visit: Payer: Self-pay | Admitting: Internal Medicine

## 2014-12-11 ENCOUNTER — Other Ambulatory Visit: Payer: Self-pay

## 2014-12-11 DIAGNOSIS — C50211 Malignant neoplasm of upper-inner quadrant of right female breast: Secondary | ICD-10-CM

## 2014-12-12 ENCOUNTER — Other Ambulatory Visit: Payer: Self-pay | Admitting: Oncology

## 2014-12-12 ENCOUNTER — Other Ambulatory Visit (HOSPITAL_BASED_OUTPATIENT_CLINIC_OR_DEPARTMENT_OTHER): Payer: Medicare Other

## 2014-12-12 DIAGNOSIS — C50211 Malignant neoplasm of upper-inner quadrant of right female breast: Secondary | ICD-10-CM | POA: Diagnosis present

## 2014-12-12 LAB — COMPREHENSIVE METABOLIC PANEL (CC13)
ALT: 11 U/L (ref 0–55)
AST: 19 U/L (ref 5–34)
Albumin: 3.4 g/dL — ABNORMAL LOW (ref 3.5–5.0)
Alkaline Phosphatase: 46 U/L (ref 40–150)
Anion Gap: 10 mEq/L (ref 3–11)
BUN: 15.6 mg/dL (ref 7.0–26.0)
CHLORIDE: 107 meq/L (ref 98–109)
CO2: 24 meq/L (ref 22–29)
CREATININE: 0.8 mg/dL (ref 0.6–1.1)
Calcium: 9.9 mg/dL (ref 8.4–10.4)
EGFR: 69 mL/min/{1.73_m2} — ABNORMAL LOW (ref >90)
Glucose: 96 mg/dl (ref 70–140)
POTASSIUM: 4.3 meq/L (ref 3.5–5.1)
Sodium: 140 mEq/L (ref 136–145)
Total Bilirubin: <0.3 mg/dL (ref 0.20–1.20)
Total Protein: 6.6 g/dL (ref 6.4–8.3)

## 2014-12-12 LAB — CBC WITH DIFFERENTIAL/PLATELET
BASO%: 0.5 % (ref 0.0–2.0)
BASOS ABS: 0 10*3/uL (ref 0.0–0.1)
EOS%: 2.2 % (ref 0.0–7.0)
Eosinophils Absolute: 0.2 10*3/uL (ref 0.0–0.5)
HCT: 37.3 % (ref 34.8–46.6)
HGB: 12.6 g/dL (ref 11.6–15.9)
LYMPH%: 25.7 % (ref 14.0–49.7)
MCH: 30.2 pg (ref 25.1–34.0)
MCHC: 33.7 g/dL (ref 31.5–36.0)
MCV: 89.5 fL (ref 79.5–101.0)
MONO#: 0.5 10*3/uL (ref 0.1–0.9)
MONO%: 7.4 % (ref 0.0–14.0)
NEUT#: 4.5 10*3/uL (ref 1.5–6.5)
NEUT%: 64.2 % (ref 38.4–76.8)
Platelets: 262 10*3/uL (ref 145–400)
RBC: 4.17 10*6/uL (ref 3.70–5.45)
RDW: 13.3 % (ref 11.2–14.5)
WBC: 7 10*3/uL (ref 3.9–10.3)
lymph#: 1.8 10*3/uL (ref 0.9–3.3)

## 2014-12-19 ENCOUNTER — Telehealth: Payer: Self-pay | Admitting: Oncology

## 2014-12-19 ENCOUNTER — Ambulatory Visit (HOSPITAL_BASED_OUTPATIENT_CLINIC_OR_DEPARTMENT_OTHER): Payer: Medicare Other | Admitting: Oncology

## 2014-12-19 VITALS — BP 137/68 | HR 88 | Temp 98.6°F | Resp 18 | Ht 63.0 in | Wt 154.4 lb

## 2014-12-19 DIAGNOSIS — M8580 Other specified disorders of bone density and structure, unspecified site: Secondary | ICD-10-CM | POA: Diagnosis not present

## 2014-12-19 DIAGNOSIS — Z720 Tobacco use: Secondary | ICD-10-CM

## 2014-12-19 DIAGNOSIS — Z853 Personal history of malignant neoplasm of breast: Secondary | ICD-10-CM | POA: Diagnosis not present

## 2014-12-19 DIAGNOSIS — C50911 Malignant neoplasm of unspecified site of right female breast: Secondary | ICD-10-CM

## 2014-12-19 DIAGNOSIS — C50211 Malignant neoplasm of upper-inner quadrant of right female breast: Secondary | ICD-10-CM

## 2014-12-19 MED ORDER — TAMOXIFEN CITRATE 20 MG PO TABS: 20.0000 mg | ORAL_TABLET | Freq: Every day | ORAL | Status: DC

## 2014-12-19 NOTE — Progress Notes (Signed)
ID: [name]   DOB: [birth year]  MR#: [id number]  KMM#:[id number]  PCP: Cathlean Cower, MD GYN:  Dr. Susette Racer SU:  Dr. Lucia Gaskins  OTHER MD:  HISTORY OF PRESENT ILLNESS:   From the original intake note:   Stacey [name] is a 74 year old [place] woman, who has a history of breast cancer starting in 1997 and in 2013.    On 11/22/95, she underwent a left breast biopsy, which revealed malignant cells.  She had a left breast lumpectomy with re-excision on 11/29/95, with final pathology revealed IDC, grade 2, 2.5 cm, ER 98%, PR 97%, Ki67 8% with one of 15 nodes being positive.  She received chemotherapy, 4 cycles per patient, along with radiation and took Tamoxifen for 7 years following radiation completion.  Records from 1997 limited.    In October 2012, she had a biopsy of the right breast after mammography recommended additional images for a dense area in the right breast.  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  On 11/25/10, she had an MRI that measured the area in the right breast as 2.2 cm.  She started neoadjuvant [name] in November 2012 and has continued it since.  The area of concern measured 1.7 cm on a repeat MRI in March of 2013.  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  Surgical margins were negative with one of one sentinel lymph node positive for metastatic carcinoma.  She then underwent radiation therapy under the care of Dr. [name] from 10/19/11 to 12/03/11.    Subsequent history is as detailed below  INTERVAL HISTORY:   Stacey [name] returns today for followup of her  Estrogen receptor positivebreast cancer.  She continues on tamoxifen, which she tolerates well. She has occasional hot flashes which are not a  concern. She thinks she pays about $13 a month for this but " that's not a concern either".  REVIEW OF SYSTEMS:  she has mild seasonal allergies, with a little bit of a runny nose and a dry cough. She has heartburn issues, which also contributed to her cough. She has stress urinary incontinence. She has some joint pains here in there which are not more intense or persistent than before. She is a little bit forgetful at times none of these aren't new problems and none of them are significantly worse than prior. A detailed review of systems today was stable  PAST MEDICAL HISTORY: Past Medical History  Diagnosis Date  . ANEMIA-NOS   . ANXIETY   . COPD     resolved  . DIVERTICULOSIS, COLON 2008  . GERD   . HYPERLIPIDEMIA   . IRRITABLE BOWEL SYNDROME, HX OF   . OSTEOARTHRITIS, HAND   . Hx of radiation therapy 10/19/11 -12/03/11    right breast  . Breast cancer 1997 L, 2012 R    s/p chemo/xrt    PAST SURGICAL HISTORY: Past Surgical History  Procedure Laterality Date  . Appendectomy    . Tonsillectomy    . Rectal fissure repair    . Abdominal hysterectomy    . Inguinal herniorrhapy  1984    left  . Tmj arthroplasty  1989  . S/p lumpectomy  1997    melignant left x 2  . S/p benign  breast biopsy  2003    right  . S/p left foot surgury  2009  . Spiral fx left foot  2008    no surgury  . Breast surgery      lumpectomy  . Breast biopsy  11/14/10     r breast: inv, insitu mammary carcinoma w/calcif, er/pr +, her2 -  . Hernia repair    . Tongue surgery      1988- to remove scar tissue growth   . Cataract extraction      both eyes    FAMILY HISTORY Family History  Problem Relation Age of Onset  . Alcohol abuse Mother     ETOH dependence  . Hypertension Mother   . Stroke Mother   . Colon polyps Mother   . Diabetes Mother   . Pancreatic cancer Mother 8  . Uterine cancer Mother 82  . Lymphoma Brother     burkitts  . Breast cancer Cousin     1 maternal, 2 paternal  .  Lung cancer Paternal Uncle   . Lung cancer Maternal Grandmother 75    non-smoker  . Lung cancer Maternal Grandfather   . Breast cancer Cousin     maternal cousin, dx in her mid 14s  . Brain cancer Cousin     maternal cousin's son; dx in his 42s  . Testicular cancer Cousin     maternal cousin's son;   . Breast cancer Cousin     paternal cousin; dx in her 37s  . Colon cancer Neg Hx   . Esophageal cancer Neg Hx   . Stomach cancer Neg Hx   . Rectal cancer Neg Hx     GYNECOLOGIC HISTORY:   Menarche age 50, GX P0 (one miscarriage at 5 months). Status post vaginal hysterectomy in the late 1970s, no salpingo-oophorectomy   SOCIAL HISTORY:  The patient lives alone and is divorced. 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 Smoking status: Current Some Day Smoker -- 0.25 packs/day for 40 years    Types: Cigarettes  . Smokeless tobacco: Never Used  . Alcohol Use: Yes     Comment: rare/ drinks socially     Colonoscopy:  Repeat due DEC 2015  PAP:  S/p remote hysterectomy, no SO  Bone density:  FEB 2015: T - 1.8  Lipid panel:    Allergies  Allergen Reactions  . Clindamycin/Lincomycin Diarrhea and Nausea And Vomiting  . Codeine Nausea And Vomiting    Current Outpatient Prescriptions  Medication Sig Dispense Refill  . aspirin 81 MG EC tablet Take 81 mg by mouth daily.      . calcium-vitamin D (OSCAL WITH D) 500-200 MG-UNIT per tablet Take 1 tablet by mouth daily.    . cholecalciferol (VITAMIN D) 1000 UNITS tablet Take 1,000 Units by mouth daily.      . clotrimazole-betamethasone (LOTRISONE) cream Use as directed twice per day as needed 15 g 1  . diphenhydrAMINE (BENADRYL) 25 MG tablet Take 25 mg by mouth every 6 (six) hours as needed.    . fluticasone (FLONASE) 50 MCG/ACT nasal spray instill 2 sprays into each nostril once daily 16 g 3  . meloxicam  (MOBIC) 15 MG tablet Take 1 tablet (15 mg total) by mouth daily. As needed 90 tablet 3  . Multiple Vitamin (MULTIVITAMIN) capsule Take 1 capsule by mouth daily.      Marland Kitchen  omeprazole (PRILOSEC) 20 MG capsule take 1 capsule by mouth once daily 30 capsule 0  . polyethylene glycol powder (GLYCOLAX/MIRALAX) powder use as directed (Patient not taking: Reported on 01/02/2014) 527 g 0  . pseudoephedrine (SUDAFED) 30 MG tablet Take 30 mg by mouth daily.     . tamoxifen (NOLVADEX) 20 MG tablet Take 1 tablet (20 mg total) by mouth daily. 90 tablet 1  . zolpidem (AMBIEN) 10 MG tablet take 1 tablet by mouth at bedtime if needed 30 tablet 3   No current facility-administered medications for this visit.    OBJECTIVE: Older white woman who appears well Filed Vitals:   12/19/14 1429  BP: 137/68  Pulse: 88  Temp: 98.6 F (37 C)  Resp: 18     Body mass index is 27.36 kg/(m^2).    ECOG FS:  0  Sclerae unicteric, pupils round and equal Oropharynx clear and moist-- no thrush or other lesions No cervical or supraclavicular adenopathy Lungs no rales or rhonchi Heart regular rate and rhythm Abd soft, nontender, positive bowel sounds MSK no focal spinal tenderness, no upper extremity lymphedema; obvious arthritic changes right hand greater than left Neuro: nonfocal, well oriented, appropriate affect Breasts:  Status post bilateral lumpectomies, with no evidence of local recurrence. Both axillae are benign.   LAB RESULTS: Lab Results  Component Value Date   WBC 7.0 12/12/2014   NEUTROABS 4.5 12/12/2014   HGB 12.6 12/12/2014   HCT 37.3 12/12/2014   MCV 89.5 12/12/2014   PLT 262 12/12/2014      Chemistry      Component Value Date/Time   NA 140 12/12/2014 1352   NA 142 02/09/2013 1418   NA 144 03/06/2011 0840   K 4.3 12/12/2014 1352   K 4.1 02/09/2013 1418   K 4.0 03/06/2011 0840   CL 105 02/09/2013 1418   CL 107 02/02/2012 0939   CL 106 03/06/2011 0840   CO2 24 12/12/2014 1352   CO2 29  02/09/2013 1418   CO2 28 03/06/2011 0840   BUN 15.6 12/12/2014 1352   BUN 20 02/09/2013 1418   BUN 17 03/06/2011 0840   CREATININE 0.8 12/12/2014 1352   CREATININE 0.8 02/09/2013 1418   CREATININE 0.8 03/06/2011 0840      Component Value Date/Time   CALCIUM 9.9 12/12/2014 1352   CALCIUM 9.7 02/09/2013 1418   CALCIUM 8.5 03/06/2011 0840   ALKPHOS 46 12/12/2014 1352   ALKPHOS 62 09/12/2012 1612   ALKPHOS 47 03/06/2011 0840   AST 19 12/12/2014 1352   AST 24 09/12/2012 1612   AST 30 03/06/2011 0840   ALT 11 12/12/2014 1352   ALT 20 09/12/2012 1612   ALT 26 03/06/2011 0840   BILITOT <0.30 12/12/2014 1352   BILITOT 0.5 09/12/2012 1612   BILITOT 0.40 03/06/2011 0840       Lab Results  Component Value Date   LABCA2 56* 12/10/2010    No components found for: [id number]  No results for input(s): INR in the last 168 hours.  Urinalysis    Component Value Date/Time   COLORURINE YELLOW 02/09/2013 [place] 02/09/2013 0918   LABSPEC >=1.030* 02/09/2013 0918   PHURINE 5.5 02/09/2013 0918   GLUCOSEU NEGATIVE 02/09/2013 0918   HGBUR NEGATIVE 02/09/2013 0918   BILIRUBINUR SMALL* 02/09/2013 0918   KETONESUR NEGATIVE 02/09/2013 0918   UROBILINOGEN 0.2 02/09/2013 0918   NITRITE NEGATIVE 02/09/2013 0918   LEUKOCYTESUR NEGATIVE 02/09/2013 0918    STUDIES: Mamm[facility] at [facility]  11/28/2014 showed  The breast density to be category C. There were no significant changes  ASSESSMENT: 74 y.o. [place] woman:  #1  S/P LEFT breast lumpectomy with re-excision on 11/29/95 for a T2 N1bi IDC of the left breast, grade 2, Stage IIB, ER 98%, PR 97%, Ki67 8%.  #2  She underwent 4 cycles of AC followed by radiation therapy under the care of Dr. [name].  #3  She took Tamoxifen for a total of seven years   #4  S/P biopsy of the RIGHT breast on 11/14/10 showing invasive ductal carcinoma,, grade 2, ER 85%, PR 57%, Ki67 20%, HER2 not amplified.    #5  She started neoadjuvant  letrozole in November 2012; switched to tamoxifen as of February 2016 due to osteopenia concerns  #6  S/P right lumpectomy with sentinel lymph node biopsy on 09/03/11 for a ypT2, ypN1a, stage IIB invasive lobular carcinoma, grade 2,estrogen receptor 97% positive, progesterone receptor 12% positive, with no HER-2 amplification.  #7  She underwent radiation therapy under the care of Dr. [name] from 10/19/11 to 12/03/11.   #8 patient does not meet criteria for genetic testing according to her insurance company.  #9 osteopenia, with a T score of -1.8 on DEXA scan at [facility] 03/07/2013  (a) status post multiple dental extractions and implant-not a bisphosphonate candidate  PLAN:   Stacey [name] is  Now little over 3 years out from her definitive surgery for her right breast cancer. There is no evidence of disease recurrence. This is very favorable.   she will complete 5 years of antiestrogen when she sees me a year from now. At that point she may "graduate" or transition to our survivorship program (more likely the former).    she knows to call for any problems that may develop before her next visit here. MAGRINAT,GUSTAV C    12/19/2014

## 2014-12-19 NOTE — Telephone Encounter (Signed)
Appointments made and avs printed for patient °

## 2014-12-31 ENCOUNTER — Telehealth: Payer: Self-pay | Admitting: Nurse Practitioner

## 2014-12-31 NOTE — Telephone Encounter (Signed)
Spoke with patient re LTS visit for 12/2.

## 2015-01-02 ENCOUNTER — Other Ambulatory Visit: Payer: Self-pay | Admitting: Nurse Practitioner

## 2015-01-03 ENCOUNTER — Telehealth: Payer: Self-pay | Admitting: Oncology

## 2015-01-03 NOTE — Telephone Encounter (Signed)
s.w. pt and advised on nov appt....pt ok and aware

## 2015-01-04 ENCOUNTER — Encounter: Payer: Medicare Other | Admitting: Nurse Practitioner

## 2015-01-09 ENCOUNTER — Other Ambulatory Visit (INDEPENDENT_AMBULATORY_CARE_PROVIDER_SITE_OTHER): Payer: Medicare Other

## 2015-01-09 ENCOUNTER — Ambulatory Visit (INDEPENDENT_AMBULATORY_CARE_PROVIDER_SITE_OTHER): Payer: Medicare Other | Admitting: Internal Medicine

## 2015-01-09 ENCOUNTER — Encounter: Payer: Self-pay | Admitting: Internal Medicine

## 2015-01-09 VITALS — BP 120/76 | HR 94 | Temp 98.3°F | Ht 63.0 in | Wt 159.0 lb

## 2015-01-09 DIAGNOSIS — R350 Frequency of micturition: Secondary | ICD-10-CM

## 2015-01-09 DIAGNOSIS — K219 Gastro-esophageal reflux disease without esophagitis: Secondary | ICD-10-CM

## 2015-01-09 DIAGNOSIS — E785 Hyperlipidemia, unspecified: Secondary | ICD-10-CM

## 2015-01-09 DIAGNOSIS — F411 Generalized anxiety disorder: Secondary | ICD-10-CM

## 2015-01-09 DIAGNOSIS — R42 Dizziness and giddiness: Secondary | ICD-10-CM

## 2015-01-09 DIAGNOSIS — R55 Syncope and collapse: Secondary | ICD-10-CM

## 2015-01-09 DIAGNOSIS — J42 Unspecified chronic bronchitis: Secondary | ICD-10-CM | POA: Diagnosis not present

## 2015-01-09 LAB — URINALYSIS, ROUTINE W REFLEX MICROSCOPIC
BILIRUBIN URINE: NEGATIVE
HGB URINE DIPSTICK: NEGATIVE
KETONES UR: NEGATIVE
Leukocytes, UA: NEGATIVE
NITRITE: NEGATIVE
SPECIFIC GRAVITY, URINE: 1.01 (ref 1.000–1.030)
Total Protein, Urine: NEGATIVE
Urine Glucose: NEGATIVE
Urobilinogen, UA: 0.2 (ref 0.0–1.0)
pH: 6 (ref 5.0–8.0)

## 2015-01-09 LAB — LIPID PANEL
CHOL/HDL RATIO: 4
CHOLESTEROL: 214 mg/dL — AB (ref 0–200)
HDL: 49 mg/dL (ref >39.00)
NonHDL: 164.53
Triglycerides: 384 mg/dL — ABNORMAL HIGH (ref 0.0–149.0)
VLDL: 76.8 mg/dL — AB (ref 0.0–40.0)

## 2015-01-09 LAB — LDL CHOLESTEROL, DIRECT: LDL DIRECT: 111 mg/dL

## 2015-01-09 LAB — TSH: TSH: 1.89 u[IU]/mL (ref 0.35–4.50)

## 2015-01-09 MED ORDER — OMEPRAZOLE 20 MG PO CPDR: 20.0000 mg | DELAYED_RELEASE_CAPSULE | Freq: Every day | ORAL | Status: DC

## 2015-01-09 MED ORDER — POLYETHYLENE GLYCOL 3350 17 GM/SCOOP PO POWD: ORAL | Status: DC

## 2015-01-09 NOTE — Progress Notes (Signed)
Subjective:    Patient ID: Stacey [name], female    DOB: December 19, 1940, 74 y.o.   MRN: [id number]  HPI  Here for yearly f/u;  Overall doing ok;  Pt denies Chest pain, worsening SOB, DOE, wheezing, orthopnea, PND, worsening LE edema, palpitations, or syncope but has had 2 episodes per month of recurring dizzy spells/ severe lightheaded the last with driving that scared her.   Pt denies neurological change such as new headache, facial or extremity weakness.  Pt denies polydipsia, polyuria, or low sugar symptoms. Pt states overall good compliance with treatment and medications, good tolerability, and has been trying to follow appropriate diet.  Pt denies worsening depressive symptoms, suicidal ideation or panic. No fever, night sweats, wt loss, loss of appetite, or other constitutional symptoms.  Pt states good ability with ADL's, has low fall risk, home safety reviewed and adequate, no other significant changes in hearing or vision, and only occasionally active with exercise. Denies urinary symptoms such as dysuria, flank pain, hematuria or n/v, fever, chills., but does have some symptoms of uriinary freq and urgency for several months., knows where all the bathrooms are on her trips away from the house. Past Medical History  Diagnosis Date  . ANEMIA-NOS   . ANXIETY   . COPD     resolved  . DIVERTICULOSIS, COLON 2008  . GERD   . HYPERLIPIDEMIA   . IRRITABLE BOWEL SYNDROME, HX OF   . OSTEOARTHRITIS, HAND   . Hx of radiation therapy 10/19/11 -12/03/11    right breast  . Breast cancer ([place]) 1997 L, 2012 R    s/p chemo/xrt   Past Surgical History  Procedure Laterality Date  . Appendectomy    . Tonsillectomy    . Rectal fissure repair    . Abdominal hysterectomy    . Inguinal herniorrhapy  1984    left  . Tmj arthroplasty  1989  . S/p lumpectomy  1997    melignant left x 2  . S/p benign breast biopsy  2003    right  . S/p left foot surgury  2009  . Spiral fx left foot  2008    no surgury    . Breast surgery      lumpectomy  . Breast biopsy  11/14/10     r breast: inv, insitu mammary carcinoma w/calcif, er/pr +, her2 -  . Hernia repair    . Tongue surgery      1988- to remove scar tissue growth   . Cataract extraction      both eyes    reports that she has been smoking Cigarettes.  She has a 10 pack-year smoking history. She has never used smokeless tobacco. She reports that she drinks alcohol. She reports that she does not use illicit drugs. family history includes Alcohol abuse in her mother; Brain cancer in her cousin; Breast cancer in her cousin, cousin, and cousin; Colon polyps in her mother; Diabetes in her mother; Hypertension in her mother; Lung cancer in her maternal grandfather and paternal uncle; Lung cancer (age of onset: 74) in her maternal grandmother; Lymphoma in her brother; Pancreatic cancer (age of onset: 13) in her mother; Stroke in her mother; Testicular cancer in her cousin; Uterine cancer (age of onset: 1) in her mother. There is no history of Colon cancer, Esophageal cancer, Stomach cancer, or Rectal cancer. Allergies  Allergen Reactions  . Clindamycin/Lincomycin Diarrhea and Nausea And Vomiting  . Codeine Nausea And Vomiting   Current Outpatient Prescriptions on  File Prior to Visit  Medication Sig Dispense Refill  . aspirin 81 MG EC tablet Take 81 mg by mouth daily.      . calcium-vitamin D (OSCAL WITH D) 500-200 MG-UNIT per tablet Take 1 tablet by mouth daily.    . cholecalciferol (VITAMIN D) 1000 UNITS tablet Take 1,000 Units by mouth daily.      . clotrimazole-betamethasone (LOTRISONE) cream Use as directed twice per day as needed 15 g 1  . diphenhydrAMINE (BENADRYL) 25 MG tablet Take 25 mg by mouth every 6 (six) hours as needed.    . fluticasone (FLONASE) 50 MCG/ACT nasal spray instill 2 sprays into each nostril once daily 16 g 3  . Multiple Vitamin (MULTIVITAMIN) capsule Take 1 capsule by mouth daily.      . pseudoephedrine (SUDAFED) 30 MG tablet  Take 30 mg by mouth daily.     . tamoxifen (NOLVADEX) 20 MG tablet Take 1 tablet (20 mg total) by mouth daily. 90 tablet 1  . zolpidem (AMBIEN) 10 MG tablet take 1 tablet by mouth at bedtime if needed 30 tablet 3   No current facility-administered medications on file prior to visit.   Review of Systems Constitutional: Negative for increased diaphoresis, other activity, appetite or siginficant weight change other than noted HENT: Negative for worsening hearing loss, ear pain, facial swelling, mouth sores and neck stiffness.   Eyes: Negative for other worsening pain, redness or visual disturbance.  Respiratory: Negative for shortness of breath and wheezing  Cardiovascular: Negative for chest pain and palpitations.  Gastrointestinal: Negative for diarrhea, blood in stool, abdominal distention or other pain Genitourinary: Negative for hematuria, flank pain or change in urine volume.  Musculoskeletal: Negative for myalgias or other joint complaints.  Skin: Negative for color change and wound or drainage.  Neurological: Negative for syncope and numbness. other than noted Hematological: Negative for adenopathy. or other swelling Psychiatric/Behavioral: Negative for hallucinations, SI, self-injury, decreased concentration or other worsening agitation.      Objective:   Physical Exam BP 120/76 mmHg  Pulse 94  Temp(Src) 98.3 F (36.8 C) (Oral)  Ht _0  (1.6 m)  Wt 159 lb (72.122 kg)  BMI 28.17 kg/m2  SpO2 97% VS noted,  Constitutional: Pt is oriented to person, place, and time. Appears well-developed and well-nourished, in no significant distress Head: Normocephalic and atraumatic.  Right Ear: External ear normal.  Left Ear: External ear normal.  Nose: Nose normal.  Mouth/Throat: Oropharynx is clear and moist.  Eyes: Conjunctivae and EOM are normal. Pupils are equal, round, and reactive to light.  Neck: Normal range of motion. Neck supple. No JVD present. No tracheal deviation present  or significant neck LA or mass Cardiovascular: Normal rate, regular rhythm, normal heart sounds and intact distal pulses.   Pulmonary/Chest: Effort normal and breath sounds without rales or wheezing  Abdominal: Soft. Bowel sounds are normal. NT. No HSM  Musculoskeletal: Normal range of motion. Exhibits no edema.  Lymphadenopathy:  Has no cervical adenopathy.  Neurological: Pt is alert and oriented to person, place, and time. Pt has normal reflexes. No cranial nerve deficit. Motor grossly intact Skin: Skin is warm and dry. No rash noted.  Psychiatric:  Has normal mood and affect. Behavior is normal.     Assessment & Plan:

## 2015-01-09 NOTE — Assessment & Plan Note (Addendum)
stable overall by history and exam, recent data reviewed with pt, and pt to continue medical treatment as before,  to f/u any worsening symptoms or concerns Lab Results  Component Value Date   LDLCALC 84 07/04/2009  for f/u lab, ECG reviewed as per emr

## 2015-01-09 NOTE — Progress Notes (Signed)
Pre visit review using our clinic review tool, if applicable. No additional management support is needed unless otherwise documented below in the visit note. 

## 2015-01-09 NOTE — Patient Instructions (Signed)
Your EKG was OK today  Please continue all other medications as before, and refills have been done if requested.  Please have the pharmacy call with any other refills you may need.  Please continue your efforts at being more active, low cholesterol diet, and weight control.  You are otherwise up to date with prevention measures today.  Please keep your appointments with your specialists as you may have planned  You will be contacted regarding the referral for: echocardiogram, cardiology, and carotid artery testing  Please go to the LAB in the Basement (turn left off the elevator) for the tests to be done today  You will be contacted by phone if any changes need to be made immediately.  Otherwise, you will receive a letter about your results with an explanation, but please check with MyChart first.  Please remember to sign up for MyChart if you have not done so, as this will be important to you in the future with finding out test results, communicating by private email, and scheduling acute appointments online when needed.  Please return in 6 months, or sooner if needed

## 2015-01-10 ENCOUNTER — Other Ambulatory Visit: Payer: Self-pay | Admitting: Internal Medicine

## 2015-01-13 DIAGNOSIS — R350 Frequency of micturition: Secondary | ICD-10-CM | POA: Insufficient documentation

## 2015-01-13 NOTE — Assessment & Plan Note (Signed)
stable overall by history and exam, recent data reviewed with pt, and pt to continue medical treatment as before,  to f/u any worsening symptoms or concerns SpO2 Readings from Last 3 Encounters:  01/09/15 97%  12/19/14 97%  06/06/14 99%

## 2015-01-13 NOTE — Assessment & Plan Note (Signed)
stable overall by history and exam, recent data reviewed with pt, and pt to continue medical treatment as before,  to f/u any worsening symptoms or concerns Lab Results  Component Value Date   WBC 7.0 12/12/2014   HGB 12.6 12/12/2014   HCT 37.3 12/12/2014   PLT 262 12/12/2014   GLUCOSE 96 12/12/2014   CHOL 214* 01/09/2015   TRIG 384.0* 01/09/2015   HDL 49.00 01/09/2015   LDLDIRECT 111.0 01/09/2015   LDLCALC 84 07/04/2009   ALT 11 12/12/2014   AST 19 12/12/2014   NA 140 12/12/2014   K 4.3 12/12/2014   CL 105 02/09/2013   CREATININE 0.8 12/12/2014   BUN 15.6 12/12/2014   CO2 24 12/12/2014   TSH 1.89 01/09/2015

## 2015-01-13 NOTE — Assessment & Plan Note (Signed)
Mild to mod o/w stable overall by history and exam, and pt to continue medical treatment as before,  to f/u any worsening symptoms or concerns

## 2015-01-13 NOTE — Assessment & Plan Note (Signed)
I suspect possible OAB, for ua, d/w pt - declines vesicare type med fornow

## 2015-01-13 NOTE — Assessment & Plan Note (Addendum)
I suspect related to significant CV abnormality not well defined and without frank syncope so far, for echo, carotids, card referral, consider holter monitor  Note:  Total time for pt hx, exam, review of record with pt in the room, determination of diagnoses and plan for further eval and tx is > 40 min, with over 50% spent in coordination and counseling of patient

## 2015-01-24 ENCOUNTER — Ambulatory Visit (INDEPENDENT_AMBULATORY_CARE_PROVIDER_SITE_OTHER): Payer: Medicare Other | Admitting: Cardiovascular Disease

## 2015-01-24 ENCOUNTER — Encounter: Payer: Self-pay | Admitting: Cardiovascular Disease

## 2015-01-24 ENCOUNTER — Ambulatory Visit (INDEPENDENT_AMBULATORY_CARE_PROVIDER_SITE_OTHER): Payer: Medicare Other

## 2015-01-24 VITALS — BP 128/72 | HR 96 | Ht 64.0 in | Wt 158.0 lb

## 2015-01-24 DIAGNOSIS — R42 Dizziness and giddiness: Secondary | ICD-10-CM | POA: Diagnosis not present

## 2015-01-24 DIAGNOSIS — R0989 Other specified symptoms and signs involving the circulatory and respiratory systems: Secondary | ICD-10-CM

## 2015-01-24 DIAGNOSIS — R0602 Shortness of breath: Secondary | ICD-10-CM | POA: Diagnosis not present

## 2015-01-24 NOTE — Assessment & Plan Note (Signed)
Mrs. Tatge was referred to me by Dr. Jenny Reichmann for evaluation of episodic dizziness which she's had over the last several years. Her cardiac risk factor profile is notable for 50-pack-years of tobacco abuse currently smoking one pack per day. She has no other risk factors. She's never had a heart attack or stroke and denies chest pain or shortness of breath. 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 She has had breast cancer twice in the past. These episodes of dizziness occur several times a month. They're spontaneous unrelated to activity or position the last several seconds at a time. She has not had true syncope. I'm going to get an event monitor, carotid Dopplers and a 2-D echocardiogram.

## 2015-01-24 NOTE — Progress Notes (Signed)
01/24/2015 [name]   05-Aug-1940  [id number]  Primary Physician Cathlean Cower, MD Primary Cardiologist: Lorretta Harp MD Renae Gloss   HPI:  Mrs. Rickards is a 74 year old mildly overweight divorced Caucasian female no children. 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 She was referred by Dr. Jenny Reichmann for evaluation of episodic dizziness which has left several years. Her cardiac risk factor profile is notable for 50-pack-years of tobacco abuse currently smoking one pack per day but otherwise is negative. She's never had a heart attack or stroke and denies chest pain or shortness of breath. 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 She's had chemotherapy and radiation therapy. The episodes occur several times a month. The last several seconds at a time. There is no associated syncope.   Current Outpatient Prescriptions  Medication Sig Dispense Refill  . aspirin 81 MG EC tablet Take 81 mg by mouth daily.      . calcium-vitamin D (OSCAL WITH D) 500-200 MG-UNIT per tablet Take 1 tablet by mouth daily.    . cholecalciferol (VITAMIN D) 1000 UNITS tablet Take 1,000 Units by mouth daily.      . clotrimazole-betamethasone (LOTRISONE) cream Use as directed twice per day as needed 15 g 1  . diphenhydrAMINE (BENADRYL) 25 MG tablet Take 25 mg by mouth every 6 (six) hours as needed.    . fluticasone (FLONASE) 50 MCG/ACT nasal spray instill 2 sprays into each nostril once daily 16 g 3  . loratadine (CLARITIN) 10 MG tablet Take 10 mg by mouth daily.    . meloxicam (MOBIC) 15 MG tablet take 1 tablet by mouth once daily if needed 90 tablet 3  . Multiple Vitamin (MULTIVITAMIN) capsule Take 1 capsule by mouth daily.      Marland Kitchen omeprazole (PRILOSEC) 20 MG capsule Take 1 capsule (20 mg total) by mouth daily. 90 capsule 3  . polyethylene glycol powder  (GLYCOLAX/MIRALAX) powder use as directed 527 g 1  . pseudoephedrine (SUDAFED) 30 MG tablet Take 30 mg by mouth every 4 (four) hours as needed.     . tamoxifen (NOLVADEX) 20 MG tablet Take 1 tablet (20 mg total) by mouth daily. 90 tablet 1  . zolpidem (AMBIEN) 10 MG tablet take 1 tablet by mouth at bedtime if needed 30 tablet 3   No current facility-administered medications for this visit.    Allergies  Allergen Reactions  . Clindamycin/Lincomycin Diarrhea and Nausea And Vomiting  . Codeine Nausea And Vomiting    Social History   Social History  . Marital Status: Divorced    Spouse Name: N/A  . Number of Children: 0  . Years of Education: N/A   Occupational History  . retired Banker    Social History Main Topics  . Smoking status: Current Some Day Smoker -- 0.25 packs/day for 40 years    Types: Cigarettes  . Smokeless tobacco: Never Used  . Alcohol Use: Yes     Comment: rare/ drinks socially  . Drug Use: No  . Sexual Activity: Not on file   Other Topics Concern  . Not on file   Social History Narrative   Patient gets no regular exercise   No biological children   2 step children     Review of Systems: General: negative for chills, fever, night sweats or weight changes.  Cardiovascular: negative  for chest pain, dyspnea on exertion, edema, orthopnea, palpitations, paroxysmal nocturnal dyspnea or shortness of breath Dermatological: negative for rash Respiratory: negative for cough or wheezing Urologic: negative for hematuria Abdominal: negative for nausea, vomiting, diarrhea, bright red blood per rectum, melena, or hematemesis Neurologic: negative for visual changes, syncope, or dizziness All other systems reviewed and are otherwise negative except as noted above.    Blood pressure 128/72, pulse 96, height 5\' 4"  (1.626 m), weight 158 lb (71.668 kg).  General appearance: alert and no distress Neck: no adenopathy, no carotid bruit, no JVD, supple, symmetrical,  trachea midline and thyroid not enlarged, symmetric, no tenderness/mass/nodules Lungs: clear to auscultation bilaterally Heart: regular rate and rhythm, S1, S2 normal, no murmur, click, rub or gallop Extremities: extremities normal, atraumatic, no cyanosis or edema  EKG not performed today  ASSESSMENT AND PLAN:   Dizziness Mrs. Stewart was referred to me by Dr. Jenny Reichmann for evaluation of episodic dizziness which she's had over the last several years. Her cardiac risk factor profile is notable for 50-pack-years of tobacco abuse currently smoking one pack per day. She has no other risk factors. She's never had a heart attack or stroke and denies chest pain or shortness of breath. 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 She has had breast cancer twice in the past. These episodes of dizziness occur several times a month. They're spontaneous unrelated to activity or position the last several seconds at a time. She has not had true syncope. I'm going to get an event monitor, carotid Dopplers and a 2-D echocardiogram.      Lorretta Harp MD [facility] 01/24/2015 11:33 AM

## 2015-01-24 NOTE — Patient Instructions (Signed)
Medication Instructions:  Your physician recommends that you continue on your current medications as directed. Please refer to the Current Medication list given to you today.   Labwork: none  Testing/Procedures: Your physician has requested that you have an echocardiogram. Echocardiography is a painless test that uses sound waves to create images of your heart. It provides your doctor with information about the size and shape of your heart and how well your heart's chambers and valves are working. This procedure takes approximately one hour. There are no restrictions for this procedure.  Your physician has requested that you have a carotid duplex. This test is an ultrasound of the carotid arteries in your neck. It looks at blood flow through these arteries that supply the brain with blood. Allow one hour for this exam. There are no restrictions or special instructions.  Your physician has recommended that you wear an event monitor. Event monitors are medical devices that record the heart's electrical activity. Doctors most often [place] these monitors to diagnose arrhythmias. Arrhythmias are problems with the speed or rhythm of the heartbeat. The monitor is a small, portable device. You can wear one while you do your normal daily activities. This is usually used to diagnose what is causing palpitations/syncope (passing out).    Follow-Up: Your physician recommends that you schedule a follow-up appointment in: 6 weeks with Dr. Gwenlyn Found.   Any Other Special Instructions Will Be Listed Below (If Applicable).     If you need a refill on your cardiac medications before your next appointment, please call your pharmacy.

## 2015-01-31 ENCOUNTER — Ambulatory Visit (HOSPITAL_COMMUNITY)
Admission: RE | Admit: 2015-01-31 | Discharge: 2015-01-31 | Disposition: A | Discharge status: Home / Self Care | Payer: Medicare Other | Source: Ambulatory Visit | Attending: Urology | Admitting: Urology

## 2015-01-31 DIAGNOSIS — I6523 Occlusion and stenosis of bilateral carotid arteries: Secondary | ICD-10-CM | POA: Insufficient documentation

## 2015-01-31 DIAGNOSIS — E785 Hyperlipidemia, unspecified: Secondary | ICD-10-CM | POA: Insufficient documentation

## 2015-01-31 DIAGNOSIS — R0989 Other specified symptoms and signs involving the circulatory and respiratory systems: Secondary | ICD-10-CM | POA: Diagnosis not present

## 2015-01-31 DIAGNOSIS — R42 Dizziness and giddiness: Secondary | ICD-10-CM | POA: Diagnosis not present

## 2015-02-08 ENCOUNTER — Ambulatory Visit (HOSPITAL_COMMUNITY): Payer: Medicare Other | Attending: Cardiovascular Disease

## 2015-02-08 ENCOUNTER — Other Ambulatory Visit: Payer: Self-pay

## 2015-02-08 DIAGNOSIS — I351 Nonrheumatic aortic (valve) insufficiency: Secondary | ICD-10-CM | POA: Diagnosis not present

## 2015-02-08 DIAGNOSIS — R0602 Shortness of breath: Secondary | ICD-10-CM | POA: Insufficient documentation

## 2015-02-08 DIAGNOSIS — R42 Dizziness and giddiness: Secondary | ICD-10-CM | POA: Diagnosis not present

## 2015-02-08 DIAGNOSIS — F17200 Nicotine dependence, unspecified, uncomplicated: Secondary | ICD-10-CM | POA: Insufficient documentation

## 2015-02-08 DIAGNOSIS — E785 Hyperlipidemia, unspecified: Secondary | ICD-10-CM | POA: Diagnosis not present

## 2015-03-08 ENCOUNTER — Ambulatory Visit (INDEPENDENT_AMBULATORY_CARE_PROVIDER_SITE_OTHER): Payer: Medicare Other | Admitting: Cardiovascular Disease

## 2015-03-08 ENCOUNTER — Encounter: Payer: Self-pay | Admitting: Cardiovascular Disease

## 2015-03-08 VITALS — BP 115/70 | HR 96 | Ht 64.0 in | Wt 158.8 lb

## 2015-03-08 DIAGNOSIS — R42 Dizziness and giddiness: Secondary | ICD-10-CM

## 2015-03-08 DIAGNOSIS — I471 Supraventricular tachycardia: Secondary | ICD-10-CM | POA: Diagnosis not present

## 2015-03-08 DIAGNOSIS — I779 Disorder of arteries and arterioles, unspecified: Secondary | ICD-10-CM | POA: Diagnosis not present

## 2015-03-08 DIAGNOSIS — I739 Peripheral vascular disease, unspecified: Secondary | ICD-10-CM

## 2015-03-08 NOTE — Assessment & Plan Note (Signed)
Mrs. Blough returns today for follow-up of her outpatient evaluation for dizziness. A 30 day event monitor showed episodes of PSVT with rates in the 170-180 range associated with dizziness. I'm going to refer her to Dr. Curt Bears , however electrophysiologist for further evaluation. Her 2-D echocardiogram was normal.

## 2015-03-08 NOTE — Assessment & Plan Note (Signed)
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 We will repeat this on an annual basis.

## 2015-03-08 NOTE — Progress Notes (Signed)
Miss Bevins returns today for follow-up of her outpatient tests. A 2-D echo was normal. Carotid Doppler showed moderate left ICA stenosis. An event monitor showed episodes of PSVT with rates in the 170-180 range associated with dizziness. I suspect her symptoms are related to this and I am referring her therefore to Dr. Curt Bears for electrophysiology evaluation.

## 2015-03-08 NOTE — Patient Instructions (Signed)
Medication Instructions:  Your physician recommends that you continue on your current medications as directed. Please refer to the Current Medication list given to you today.   Labwork: none  Testing/Procedures: none  Follow-Up: You have been referred to Dr. Curt Bears '(EP) - Symptomatic PSVT  Your physician wants you to follow-up in: 6 months with Dr. Gwenlyn Found. You will receive a reminder letter in the mail two months in advance. If you don't receive a letter, please call our office to schedule the follow-up appointment.   Any Other Special Instructions Will Be Listed Below (If Applicable).     If you need a refill on your cardiac medications before your next appointment, please call your pharmacy.

## 2015-03-12 ENCOUNTER — Ambulatory Visit (INDEPENDENT_AMBULATORY_CARE_PROVIDER_SITE_OTHER): Payer: Medicare Other | Admitting: Cardiology

## 2015-03-12 ENCOUNTER — Encounter: Payer: Self-pay | Admitting: Cardiology

## 2015-03-12 VITALS — BP 120/70 | HR 74 | Ht 63.0 in | Wt 158.2 lb

## 2015-03-12 DIAGNOSIS — Z01812 Encounter for preprocedural laboratory examination: Secondary | ICD-10-CM

## 2015-03-12 DIAGNOSIS — I471 Supraventricular tachycardia: Secondary | ICD-10-CM | POA: Diagnosis not present

## 2015-03-12 NOTE — Progress Notes (Signed)
Electrophysiology Office Note   Date:  03/12/2015   ID:  Stacey [name], DOB [birth year], MRN [id number]  PCP:  Cathlean Cower, MD  Cardiologist:  Gwenlyn Found Primary Electrophysiologist:  Will Meredith Leeds, MD    No chief complaint on file.    History of Present Illness: Stacey [name] is a 75 y.o. female who presents today for electrophysiology evaluation.   Her cardiac risk factor profile is notable for 50-pack-years of tobacco abuse currently smoking one pack per day but otherwise is negative. She's never had a heart attack or stroke and denies chest pain or shortness of breath. 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 She's had chemotherapy and radiation therapy. The episodes occur several times a month. The last several seconds at a time. There is no associated syncope. She says that she usually feels well, but when she does get palpitations she gets dizzy. She wore the heart monitor and had palpitations 3 times during that period. She does not know of anything that starts the arrhythmia, and has not found anything to get stop.  Today, she denies symptoms of palpitations, chest pain, shortness of breath, orthopnea, PND, lower extremity edema, claudication, dizziness, presyncope, syncope, bleeding, or neurologic sequela. The patient is tolerating medications without difficulties and is otherwise without complaint today.    Past Medical History  Diagnosis Date  . ANEMIA-NOS   . ANXIETY   . COPD     resolved  . DIVERTICULOSIS, COLON 2008  . GERD   . HYPERLIPIDEMIA   . IRRITABLE BOWEL SYNDROME, HX OF   . OSTEOARTHRITIS, HAND   . Hx of radiation therapy 10/19/11 -12/03/11    right breast  . Breast cancer ([place]) 1997 L, 2012 R    s/p chemo/xrt  . Dizziness   . PSVT (paroxysmal supraventricular tachycardia) (HCC)     symptomatic on event monitor  . Left-sided carotid artery disease (HCC)     moderate left ICA stenosis   Past Surgical  History  Procedure Laterality Date  . Appendectomy    . Tonsillectomy    . Rectal fissure repair    . Abdominal hysterectomy    . Inguinal herniorrhapy  1984    left  . Tmj arthroplasty  1989  . S/p lumpectomy  1997    melignant left x 2  . S/p benign breast biopsy  2003    right  . S/p left foot surgury  2009  . Spiral fx left foot  2008    no surgury  . Breast surgery      lumpectomy  . Breast biopsy  11/14/10     r breast: inv, insitu mammary carcinoma w/calcif, er/pr +, her2 -  . Hernia repair    . Tongue surgery      1988- to remove scar tissue growth   . Cataract extraction      both eyes     Current Outpatient Prescriptions  Medication Sig Dispense Refill  . aspirin 81 MG EC tablet Take 81 mg by mouth daily.      . calcium-vitamin D (OSCAL WITH D) 500-200 MG-UNIT per tablet Take 1 tablet by mouth daily.    . cholecalciferol (VITAMIN D) 1000 UNITS tablet Take 1,000 Units by mouth daily.      . clotrimazole-betamethasone (LOTRISONE) cream Use as directed twice per day as needed 15 g 1  . diphenhydrAMINE (BENADRYL) 25 MG tablet Take 25 mg by mouth every 6 (  six) hours as needed.    . fluticasone (FLONASE) 50 MCG/ACT nasal spray instill 2 sprays into each nostril once daily 16 g 3  . loratadine (CLARITIN) 10 MG tablet Take 10 mg by mouth daily.    . meloxicam (MOBIC) 15 MG tablet take 1 tablet by mouth once daily if needed 90 tablet 3  . Multiple Vitamin (MULTIVITAMIN) capsule Take 1 capsule by mouth daily.      Marland Kitchen omeprazole (PRILOSEC) 20 MG capsule Take 1 capsule (20 mg total) by mouth daily. 90 capsule 3  . polyethylene glycol powder (GLYCOLAX/MIRALAX) powder use as directed 527 g 1  . pseudoephedrine (SUDAFED) 30 MG tablet Take 30 mg by mouth every 4 (four) hours as needed.     . tamoxifen (NOLVADEX) 20 MG tablet Take 1 tablet (20 mg total) by mouth daily. 90 tablet 1  . zolpidem (AMBIEN) 10 MG tablet take 1 tablet by mouth at bedtime if needed 30 tablet 3   No  current facility-administered medications for this visit.    Allergies:   Clindamycin/lincomycin and Codeine   Social History:  The patient  reports that she has been smoking Cigarettes.  She has a 10 pack-year smoking history. She has never used smokeless tobacco. She reports that she drinks alcohol. She reports that she does not use illicit drugs.   Family History:  The patient's family history includes Alcohol abuse in her mother; Brain cancer in her cousin; Breast cancer in her cousin, cousin, and cousin; Colon polyps in her mother; Diabetes in her mother; Hypertension in her mother; Lung cancer in her maternal grandfather and paternal uncle; Lung cancer (age of onset: 28) in her maternal grandmother; Lymphoma in her brother; Pancreatic cancer (age of onset: 20) in her mother; Stroke in her mother; Testicular cancer in her cousin; Uterine cancer (age of onset: 40) in her mother. There is no history of Colon cancer, Esophageal cancer, Stomach cancer, or Rectal cancer.    ROS:  Please see the history of present illness.   Otherwise, review of systems is positive for palpitations, dizziness.   All other systems are reviewed and negative.    PHYSICAL EXAM: VS:  There were no vitals taken for this visit. , BMI There is no weight on file to calculate BMI. GEN: Well nourished, well developed, in no acute distress HEENT: normal Neck: no JVD, carotid bruits, or masses Cardiac: RRR; no murmurs, rubs, or gallops,no edema  Respiratory:  clear to auscultation bilaterally, normal work of breathing GI: soft, nontender, nondistended, + BS MS: no deformity or atrophy Skin: warm and dry Neuro:  Strength and sensation are intact Psych: euthymic mood, full affect  EKG:  EKG is not ordered today.  Recent Labs: 12/12/2014: ALT 11; BUN 15.6; Creatinine 0.8; HGB 12.6; Platelets 262; Potassium 4.3; Sodium 140 01/09/2015: TSH 1.89    Lipid Panel     Component Value Date/Time   CHOL 214* 01/09/2015 1650     TRIG 384.0* 01/09/2015 1650   HDL 49.00 01/09/2015 1650   CHOLHDL 4 01/09/2015 1650   VLDL 76.8* 01/09/2015 1650   LDLCALC 84 07/04/2009 0812   LDLDIRECT 111.0 01/09/2015 1650     Wt Readings from Last 3 Encounters:  03/08/15 158 lb 12.8 oz (72.031 kg)  01/24/15 158 lb (71.668 kg)  01/09/15 159 lb (72.122 kg)      Other studies Reviewed: Additional studies/ records that were reviewed today include: TTE 02/08/15  Review of the above records today demonstrates:  - Left  ventricle: The cavity size was normal. Systolic function was normal. The estimated ejection fraction was in the range of 60% to 65%. Wall motion was normal; there were no regional wall motion abnormalities. Doppler parameters are consistent with abnormal left ventricular relaxation (grade 1 diastolic dysfunction). - Aortic valve: There was trivial regurgitation. - Mitral valve: Calcified annulus. Mildly thickened leaflets   Outpatient telemetry: 1. NSR/ST 2. PSVT   ASSESSMENT AND PLAN:  1.  SVT: found on event monitor and associated with dizziness.  I discussed medical management versus ablation with her. Discussed the risks of ablation, including bleeding, tamponade, heart block, stroke. she has agreed to ablation and we will proceed with scheduling. 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  She has agreed to ablation for her SVT.  She will call with dates of ablation.    Current medicines are reviewed at length with the patient today.   The patient does not have concerns regarding her medicines.  The following changes were made today:  none  Labs/ tests ordered today include:  No orders of the defined types were placed in this encounter.     Disposition:   FU with Will Camnitz post ablation  Signed, Will Meredith Leeds, MD  03/12/2015 8:24 AM     [place] [address] [place] [address] [phone] (office) [phone] (fax)

## 2015-03-12 NOTE — Patient Instructions (Signed)
Medication Instructions:  Your physician recommends that you continue on your current medications as directed. Please refer to the Current Medication list given to you today.  Labwork: None ordered  Testing/Procedures: Your physician has recommended that you have an ablation. Catheter ablation is a medical procedure used to treat some cardiac arrhythmias (irregular heartbeats). During catheter ablation, a long, thin, flexible tube is put into a blood vessel in your groin (upper thigh), or neck. This tube is called an ablation catheter. It is then guided to your heart through the blood vessel. Radio frequency waves destroy small areas of heart tissue where abnormal heartbeats may cause an arrhythmia to start.   Please call Chelsei Mcchesney, RN when you have made a decision on a date.  Available dates (these are subject to change):  3/31, 4/5, 4/7, 4/10, 4/13, 4/24  Follow-Up: To be determined once procedure is scheduled.   If you need a refill on your cardiac medications before your next appointment, please call your pharmacy.  Thank you for choosing [facility]!!   Trinidad Curet, RN [phone]   Any Other Special Instructions Will Be Listed Below (If Applicable).  Cardiac Ablation Cardiac ablation is a procedure to disable a small amount of heart tissue in very specific places. The heart has many electrical connections. Sometimes these connections are abnormal and can cause the heart to beat very fast or irregularly. By disabling some of the problem areas, heart rhythm can be improved or made normal. Ablation is done for people who:   Have Wolff-Parkinson-White syndrome.   Have other fast heart rhythms (tachycardia).   Have taken medicines for an abnormal heart rhythm (arrhythmia) that resulted in:   No success.   Side effects.   May have a high-risk heartbeat that could result in death.  LET [facility] CARE PROVIDER KNOW ABOUT:   Any allergies you have or any previous  reactions you have had to X-ray dye, food (such as seafood), medicine, or tape.   All medicines you are taking, including vitamins, herbs, eye drops, creams, and over-the-counter medicines.   Previous problems you or members of your family have had with the use of anesthetics.   Any blood disorders you have.   Previous surgeries or procedures (such as a kidney transplant) you have had.   Medical conditions you have (such as kidney failure).  RISKS AND COMPLICATIONS Generally, cardiac ablation is a safe procedure. However, problems can occur and include:   Increased risk of cancer. Depending on how long it takes to do the ablation, the dose of radiation can be high.  Bruising and bleeding where a thin, flexible tube (catheter) was inserted during the procedure.   Bleeding into the chest, especially into the sac that surrounds the heart (serious).  Need for a permanent pacemaker if the normal electrical system is damaged.   The procedure may not be fully effective, and this may not be recognized for months. Repeat ablation procedures are sometimes required. BEFORE THE PROCEDURE   Follow any instructions from your health care provider regarding eating and drinking before the procedure.   Take your medicines as directed at regular times with water, unless instructed otherwise by your health care provider. If you are taking diabetes medicine, including insulin, ask how you are to take it and if there are any special instructions you should follow. It is common to adjust insulin dosing the day of the ablation.  PROCEDURE  An ablation is usually performed in a catheterization laboratory with the guidance  of fluoroscopy. Fluoroscopy is a type of X-ray that helps your health care provider see images of your heart during the procedure.   An ablation is a minimally invasive procedure. This means a small cut (incision) is made in either your neck or groin. Your health care provider  will decide where to make the incision based on your medical history and physical exam.  An IV tube will be started before the procedure begins. You will be given an anesthetic or medicine to help you relax (sedative).  The skin on your neck or groin will be numbed. A needle will be inserted into a large vein in your neck or groin and catheters will be threaded to your heart.  A special dye that shows up on fluoroscopy pictures may be injected through the catheter. The dye helps your health care provider see the area of the heart that needs treatment.  The catheter has electrodes on the tip. When the area of heart tissue that is causing the arrhythmia is found, the catheter tip will send an electrical current to the area and "scar" the tissue. Three types of energy can be used to ablate the heart tissue:   Heat (radiofrequency energy).   Laser energy.   Extreme cold (cryoablation).   When the area of the heart has been ablated, the catheter will be taken out. Pressure will be held on the insertion site. This will help the insertion site clot and keep it from bleeding. A bandage will be placed on the insertion site.  AFTER THE PROCEDURE   After the procedure, you will be taken to a recovery area where your vital signs (blood pressure, heart rate, and breathing) will be monitored. The insertion site will also be monitored for bleeding.   You will need to lie still for 4-6 hours. This is to ensure you do not bleed from the catheter insertion site.    This information is not intended to replace advice given to you by your health care provider. Make sure you discuss any questions you have with your health care provider.   Document Released: 06/07/2008 Document Revised: 02/09/2014 Document Reviewed: 06/13/2012 Elsevier Interactive Patient Education [employer].

## 2015-03-29 ENCOUNTER — Encounter: Payer: Self-pay | Admitting: *Deleted

## 2015-03-29 ENCOUNTER — Telehealth: Payer: Self-pay | Admitting: Cardiology

## 2015-03-29 NOTE — Telephone Encounter (Signed)
New message     March 31st is a good day to do the procedure.  Also, pt has a question about stopping her medications prior to procedure.

## 2015-03-29 NOTE — Telephone Encounter (Signed)
Informed patient, about an hour ago, 3/31 hospital schedule is full. She requested 4/7. Explained that I would schedule SVT ablation and call her this afternoon to review details/instructions. Patient verbalized understanding and agreeable to plan.

## 2015-03-29 NOTE — Telephone Encounter (Signed)
Svt ablation scheduled for 4/7.  Pre procedure labs on 3/31. Letter of instructions reviewed with patient and mailed to home address. Patient aware office will contact her to arrange post ablation f/u appts. Patient verbalized understanding and agreeable to plan.

## 2015-04-02 DIAGNOSIS — M8589 Other specified disorders of bone density and structure, multiple sites: Secondary | ICD-10-CM | POA: Diagnosis not present

## 2015-04-04 ENCOUNTER — Other Ambulatory Visit: Payer: Self-pay

## 2015-04-04 MED ORDER — POLYETHYLENE GLYCOL 3350 17 GM/SCOOP PO POWD: ORAL | Status: DC

## 2015-05-01 DIAGNOSIS — Z961 Presence of intraocular lens: Secondary | ICD-10-CM | POA: Diagnosis not present

## 2015-05-01 DIAGNOSIS — H26491 Other secondary cataract, right eye: Secondary | ICD-10-CM | POA: Diagnosis not present

## 2015-05-01 DIAGNOSIS — H40013 Open angle with borderline findings, low risk, bilateral: Secondary | ICD-10-CM | POA: Diagnosis not present

## 2015-05-01 DIAGNOSIS — H353112 Nonexudative age-related macular degeneration, right eye, intermediate dry stage: Secondary | ICD-10-CM | POA: Diagnosis not present

## 2015-05-03 ENCOUNTER — Other Ambulatory Visit (INDEPENDENT_AMBULATORY_CARE_PROVIDER_SITE_OTHER): Payer: Medicare Other | Admitting: *Deleted

## 2015-05-03 DIAGNOSIS — E785 Hyperlipidemia, unspecified: Secondary | ICD-10-CM

## 2015-05-03 DIAGNOSIS — I739 Peripheral vascular disease, unspecified: Secondary | ICD-10-CM

## 2015-05-03 DIAGNOSIS — D630 Anemia in neoplastic disease: Secondary | ICD-10-CM

## 2015-05-03 DIAGNOSIS — F411 Generalized anxiety disorder: Secondary | ICD-10-CM

## 2015-05-03 DIAGNOSIS — I779 Disorder of arteries and arterioles, unspecified: Secondary | ICD-10-CM

## 2015-05-03 DIAGNOSIS — F419 Anxiety disorder, unspecified: Secondary | ICD-10-CM | POA: Diagnosis not present

## 2015-05-03 LAB — CBC WITH DIFFERENTIAL/PLATELET
BASOS PCT: 0 % (ref 0–1)
Basophils Absolute: 0 10*3/uL (ref 0.0–0.1)
EOS ABS: 0.2 10*3/uL (ref 0.0–0.7)
Eosinophils Relative: 2 % (ref 0–5)
HCT: 35.1 % — ABNORMAL LOW (ref 36.0–46.0)
HEMOGLOBIN: 11.9 g/dL — AB (ref 12.0–15.0)
LYMPHS ABS: 1.8 10*3/uL (ref 0.7–4.0)
Lymphocytes Relative: 23 % (ref 12–46)
MCH: 30.7 pg (ref 26.0–34.0)
MCHC: 33.9 g/dL (ref 30.0–36.0)
MCV: 90.7 fL (ref 78.0–100.0)
MONO ABS: 0.6 10*3/uL (ref 0.1–1.0)
MONOS PCT: 8 % (ref 3–12)
MPV: 7.9 fL — ABNORMAL LOW (ref 8.6–12.4)
NEUTROS ABS: 5.2 10*3/uL (ref 1.7–7.7)
NEUTROS PCT: 67 % (ref 43–77)
Platelets: 199 10*3/uL (ref 150–400)
RBC: 3.87 MIL/uL (ref 3.87–5.11)
RDW: 13.9 % (ref 11.5–15.5)
WBC: 7.8 10*3/uL (ref 4.0–10.5)

## 2015-05-03 LAB — BASIC METABOLIC PANEL
BUN: 17 mg/dL (ref 7–25)
CALCIUM: 8.5 mg/dL — AB (ref 8.6–10.4)
CHLORIDE: 106 mmol/L (ref 98–110)
CO2: 25 mmol/L (ref 20–31)
CREATININE: 0.81 mg/dL (ref 0.60–0.93)
GLUCOSE: 95 mg/dL (ref 65–99)
Potassium: 4.1 mmol/L (ref 3.5–5.3)
Sodium: 142 mmol/L (ref 135–146)

## 2015-05-03 NOTE — Addendum Note (Signed)
Addended by: Eulis Foster on: 05/03/2015 11:49 AM   Modules accepted: Orders

## 2015-05-10 ENCOUNTER — Ambulatory Visit (HOSPITAL_COMMUNITY)
Admission: RE | Admit: 2015-05-10 | Discharge: 2015-05-10 | Disposition: A | Discharge status: Home / Self Care | Payer: Medicare Other | Source: Ambulatory Visit | Attending: Cardiology | Admitting: Cardiology

## 2015-05-10 ENCOUNTER — Encounter (HOSPITAL_COMMUNITY)
Admission: RE | Disposition: A | Discharge status: Home / Self Care | Payer: Self-pay | Source: Ambulatory Visit | Attending: Cardiology

## 2015-05-10 DIAGNOSIS — Z7982 Long term (current) use of aspirin: Secondary | ICD-10-CM | POA: Insufficient documentation

## 2015-05-10 DIAGNOSIS — Z79899 Other long term (current) drug therapy: Secondary | ICD-10-CM | POA: Insufficient documentation

## 2015-05-10 DIAGNOSIS — K219 Gastro-esophageal reflux disease without esophagitis: Secondary | ICD-10-CM | POA: Diagnosis not present

## 2015-05-10 DIAGNOSIS — F17210 Nicotine dependence, cigarettes, uncomplicated: Secondary | ICD-10-CM | POA: Insufficient documentation

## 2015-05-10 DIAGNOSIS — I471 Supraventricular tachycardia: Secondary | ICD-10-CM | POA: Diagnosis not present

## 2015-05-10 DIAGNOSIS — Z9221 Personal history of antineoplastic chemotherapy: Secondary | ICD-10-CM | POA: Diagnosis not present

## 2015-05-10 DIAGNOSIS — Z923 Personal history of irradiation: Secondary | ICD-10-CM | POA: Insufficient documentation

## 2015-05-10 DIAGNOSIS — Z79810 Long term (current) use of selective estrogen receptor modulators (SERMs): Secondary | ICD-10-CM | POA: Diagnosis not present

## 2015-05-10 DIAGNOSIS — Z853 Personal history of malignant neoplasm of breast: Secondary | ICD-10-CM | POA: Insufficient documentation

## 2015-05-10 HISTORY — PX: ELECTROPHYSIOLOGIC STUDY: SHX172A

## 2015-05-10 SURGERY — A-FLUTTER/A-TACH/SVT ABLATION
Anesthesia: LOCAL

## 2015-05-10 MED ORDER — HEPARIN (PORCINE) IN NACL 2-0.9 UNIT/ML-% IJ SOLN
INTRAMUSCULAR | Status: AC
  Filled 2015-05-10: qty 500

## 2015-05-10 MED ORDER — SODIUM CHLORIDE 0.9 % IV SOLN: 250.0000 mL | INTRAVENOUS | Status: DC | PRN

## 2015-05-10 MED ORDER — BUPIVACAINE HCL (PF) 0.25 % IJ SOLN
INTRAMUSCULAR | Status: AC
  Filled 2015-05-10: qty 60

## 2015-05-10 MED ORDER — MIDAZOLAM HCL 5 MG/5ML IJ SOLN
INTRAMUSCULAR | Status: DC | PRN
  Administered 2015-05-10 (×6): 1 mg via INTRAVENOUS

## 2015-05-10 MED ORDER — SODIUM CHLORIDE 0.9 % IV SOLN
INTRAVENOUS | Status: DC | PRN
  Administered 2015-05-10: 50 mL/h via INTRAVENOUS

## 2015-05-10 MED ORDER — MIDAZOLAM HCL 5 MG/5ML IJ SOLN
INTRAMUSCULAR | Status: AC
  Filled 2015-05-10: qty 5

## 2015-05-10 MED ORDER — ONDANSETRON HCL 4 MG/2ML IJ SOLN: 4.0000 mg | Freq: Four times a day (QID) | INTRAMUSCULAR | Status: DC | PRN

## 2015-05-10 MED ORDER — ISOPROTERENOL HCL 0.2 MG/ML IJ SOLN
INTRAMUSCULAR | Status: AC
  Filled 2015-05-10: qty 5

## 2015-05-10 MED ORDER — HEPARIN (PORCINE) IN NACL 2-0.9 UNIT/ML-% IJ SOLN
INTRAMUSCULAR | Status: DC | PRN
Start: 2015-05-10 — End: 2015-05-10
  Administered 2015-05-10: 12:00:00

## 2015-05-10 MED ORDER — SODIUM CHLORIDE 0.9 % IV SOLN
1.0000 mg | INTRAVENOUS | Status: DC | PRN
  Administered 2015-05-10: 2 ug/min via INTRAVENOUS

## 2015-05-10 MED ORDER — ACETAMINOPHEN 325 MG PO TABS
650.0000 mg | ORAL_TABLET | ORAL | Status: DC | PRN
  Filled 2015-05-10: qty 2

## 2015-05-10 MED ORDER — FENTANYL CITRATE (PF) 100 MCG/2ML IJ SOLN
INTRAMUSCULAR | Status: AC
  Filled 2015-05-10: qty 2

## 2015-05-10 MED ORDER — BUPIVACAINE HCL (PF) 0.25 % IJ SOLN
INTRAMUSCULAR | Status: DC | PRN
  Administered 2015-05-10: 47 mL

## 2015-05-10 MED ORDER — SODIUM CHLORIDE 0.9% FLUSH: 3.0000 mL | Freq: Two times a day (BID) | INTRAVENOUS | Status: DC

## 2015-05-10 MED ORDER — SODIUM CHLORIDE 0.9% FLUSH
3.0000 mL | INTRAVENOUS | Status: DC | PRN
Start: 2015-05-10 — End: 2015-05-10

## 2015-05-10 MED ORDER — FENTANYL CITRATE (PF) 100 MCG/2ML IJ SOLN
INTRAMUSCULAR | Status: DC | PRN
  Administered 2015-05-10 (×2): 25 ug via INTRAVENOUS

## 2015-05-10 SURGICAL SUPPLY — 11 items
BAG SNAP BAND KOVER 36X36 (MISCELLANEOUS) ×2 IMPLANT
CATH JOSEPHSON QUAD-ALLRED 6FR (CATHETERS) ×2 IMPLANT
CATH WEBSTER BI DIR CS D-F CRV (CATHETERS) ×1 IMPLANT
PACK EP LATEX FREE (CUSTOM PROCEDURE TRAY) ×2
PACK EP LF (CUSTOM PROCEDURE TRAY) ×1 IMPLANT
PAD DEFIB LIFELINK (PAD) ×2 IMPLANT
PATCH CARTO3 (PAD) ×1 IMPLANT
SHEATH PINNACLE 6F 10CM (SHEATH) ×2 IMPLANT
SHEATH PINNACLE 7F 10CM (SHEATH) ×1 IMPLANT
SHEATH PINNACLE 8F 10CM (SHEATH) ×1 IMPLANT
SHIELD RADPAD SCOOP 12X17 (MISCELLANEOUS) ×2 IMPLANT

## 2015-05-10 NOTE — Discharge Instructions (Signed)
No driving for 4 days. No lifting over 5 lbs for 1 week. No sexual activity for 1 week. You may return to work in 1 week. Keep procedure site clean & dry. If you notice increased pain, swelling, bleeding or pus, call/return!  You may shower, but no soaking baths/hot tubs/pools for 1 week.  ° ° °

## 2015-05-10 NOTE — Progress Notes (Signed)
Site area: lt groin Site Prior to Removal:  Level 0 Pressure Applied For:  10 minutes Manual:   yes Patient Status During Pull:  stable Post Pull Site:  Level  0 Post Pull Instructions Given:  yes Post Pull Pulses Present: yes Dressing Applied:  tegaderm Bedrest begins @  [id number] Comments:  IV saline locked

## 2015-05-10 NOTE — Progress Notes (Signed)
Site area: rt groin Site Prior to Removal:  Level 0 Pressure Applied For:  10 minutes Manual:   yes Patient Status During Pull:  stable Post Pull Site:  Level  0 Post Pull Instructions Given:  yes Post Pull Pulses Present: yes Dressing Applied:  Small tegaderm Bedrest begins @  Comments:   

## 2015-05-10 NOTE — H&P (Signed)
Date: 03/12/2015   ID: Stacey [name], DOB [birth year], MRN [id number]  PCP: Cathlean Cower, MD Cardiologist: Gwenlyn Found Primary Electrophysiologist: Kahliyah Dick Meredith Leeds, MD   No chief complaint on file.   History of Present Illness: Stacey [name] is a 75 y.o. female who presents today for electrophysiology evaluation. Her cardiac risk factor profile is notable for 50-pack-years of tobacco abuse currently smoking one pack per day but otherwise is negative. She's never had a heart attack or stroke and denies chest pain or shortness of breath. 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 She's had chemotherapy and radiation therapy. The episodes occur several times a month. The last several seconds at a time. There is no associated syncope. She says that she usually feels well, but when she does get palpitations she gets dizzy. She wore the heart monitor and had palpitations 3 times during that period. She does not know of anything that starts the arrhythmia, and has not found anything to get stop.  Today, she denies symptoms of palpitations, chest pain, shortness of breath, orthopnea, PND, lower extremity edema, claudication, dizziness, presyncope, syncope, bleeding, or neurologic sequela. The patient is tolerating medications without difficulties and is otherwise without complaint today.    Past Medical History  Diagnosis Date  . ANEMIA-NOS   . ANXIETY   . COPD     resolved  . DIVERTICULOSIS, COLON 2008  . GERD   . HYPERLIPIDEMIA   . IRRITABLE BOWEL SYNDROME, HX OF   . OSTEOARTHRITIS, HAND   . Hx of radiation therapy 10/19/11 -12/03/11    right breast  . Breast cancer ([place]) 1997 L, 2012 R    s/p chemo/xrt  . Dizziness   . PSVT (paroxysmal supraventricular tachycardia) (HCC)     symptomatic on event monitor  . Left-sided carotid artery disease (HCC)     moderate left  ICA stenosis   Past Surgical History  Procedure Laterality Date  . Appendectomy    . Tonsillectomy    . Rectal fissure repair    . Abdominal hysterectomy    . Inguinal herniorrhapy  1984    left  . Tmj arthroplasty  1989  . S/p lumpectomy  1997    melignant left x 2  . S/p benign breast biopsy  2003    right  . S/p left foot surgury  2009  . Spiral fx left foot  2008    no surgury  . Breast surgery      lumpectomy  . Breast biopsy  11/14/10     r breast: inv, insitu mammary carcinoma w/calcif, er/pr +, her2 -  . Hernia repair    . Tongue surgery      1988- to remove scar tissue growth   . Cataract extraction      both eyes     Current Outpatient Prescriptions  Medication Sig Dispense Refill  . aspirin 81 MG EC tablet Take 81 mg by mouth daily.     . calcium-vitamin D (OSCAL WITH D) 500-200 MG-UNIT per tablet Take 1 tablet by mouth daily.    . cholecalciferol (VITAMIN D) 1000 UNITS tablet Take 1,000 Units by mouth daily.     . clotrimazole-betamethasone (LOTRISONE) cream Use as directed twice per day as needed 15 g 1  . diphenhydrAMINE (BENADRYL) 25 MG tablet Take 25 mg by mouth every 6 (six) hours as needed.    . fluticasone (FLONASE) 50 MCG/ACT nasal spray instill 2 sprays  into each nostril once daily 16 g 3  . loratadine (CLARITIN) 10 MG tablet Take 10 mg by mouth daily.    . meloxicam (MOBIC) 15 MG tablet take 1 tablet by mouth once daily if needed 90 tablet 3  . Multiple Vitamin (MULTIVITAMIN) capsule Take 1 capsule by mouth daily.     Marland Kitchen omeprazole (PRILOSEC) 20 MG capsule Take 1 capsule (20 mg total) by mouth daily. 90 capsule 3  . polyethylene glycol powder (GLYCOLAX/MIRALAX) powder use as directed 527 g 1  . pseudoephedrine (SUDAFED) 30 MG tablet Take 30 mg by mouth every 4 (four) hours as needed.     .  tamoxifen (NOLVADEX) 20 MG tablet Take 1 tablet (20 mg total) by mouth daily. 90 tablet 1  . zolpidem (AMBIEN) 10 MG tablet take 1 tablet by mouth at bedtime if needed 30 tablet 3   No current facility-administered medications for this visit.    Allergies: Clindamycin/lincomycin and Codeine   Social History: The patient  reports that she has been smoking Cigarettes. She has a 10 pack-year smoking history. She has never used smokeless tobacco. She reports that she drinks alcohol. She reports that she does not use illicit drugs.   Family History: The patient's family history includes Alcohol abuse in her mother; Brain cancer in her cousin; Breast cancer in her cousin, cousin, and cousin; Colon polyps in her mother; Diabetes in her mother; Hypertension in her mother; Lung cancer in her maternal grandfather and paternal uncle; Lung cancer (age of onset: 24) in her maternal grandmother; Lymphoma in her brother; Pancreatic cancer (age of onset: 30) in her mother; Stroke in her mother; Testicular cancer in her cousin; Uterine cancer (age of onset: 5) in her mother. There is no history of Colon cancer, Esophageal cancer, Stomach cancer, or Rectal cancer.    ROS: Please see the history of present illness. Otherwise, review of systems is positive for palpitations, dizziness. All other systems are reviewed and negative.    PHYSICAL EXAM: VS: There were no vitals taken for this visit. , BMI There is no weight on file to calculate BMI. GEN: Well nourished, well developed, in no acute distress  HEENT: normal  Neck: no JVD, carotid bruits, or masses Cardiac: RRR; no murmurs, rubs, or gallops,no edema  Respiratory: clear to auscultation bilaterally, normal work of breathing GI: soft, nontender, nondistended, + BS MS: no deformity or atrophy  Skin: warm and dry Neuro: Strength and sensation are intact Psych: euthymic mood, full affect  EKG: EKG is not ordered today.  Recent  Labs: 12/12/2014: ALT 11; BUN 15.6; Creatinine 0.8; HGB 12.6; Platelets 262; Potassium 4.3; Sodium 140 01/09/2015: TSH 1.89    Lipid Panel   Labs (Brief)       Component Value Date/Time   CHOL 214* 01/09/2015 1650   TRIG 384.0* 01/09/2015 1650   HDL 49.00 01/09/2015 1650   CHOLHDL 4 01/09/2015 1650   VLDL 76.8* 01/09/2015 1650   LDLCALC 84 07/04/2009 0812   LDLDIRECT 111.0 01/09/2015 1650       Wt Readings from Last 3 Encounters:  03/08/15 158 lb 12.8 oz (72.031 kg)  01/24/15 158 lb (71.668 kg)  01/09/15 159 lb (72.122 kg)      Other studies Reviewed: Additional studies/ records that were reviewed today include: TTE 02/08/15  Review of the above records today demonstrates:  - Left ventricle: The cavity size was normal. Systolic function was normal. The estimated ejection fraction was in the range of 60% to 65%.  Wall motion was normal; there were no regional wall motion abnormalities. Doppler parameters are consistent with abnormal left ventricular relaxation (grade 1 diastolic dysfunction). - Aortic valve: There was trivial regurgitation. - Mitral valve: Calcified annulus. Mildly thickened leaflets   Outpatient telemetry: 1. NSR/ST 2. PSVT   ASSESSMENT AND PLAN:  1. SVT: found on event monitor and associated with dizziness. I discussed medical management versus ablation with her. Discussed the risks of ablation, including bleeding, tamponade, heart block, stroke. she has agreed to ablation and we will proceed with scheduling. 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 She has agreed to ablation for her SVT.          Stacey [name] presents to the hospital pre procedure with a diagnosis of SVT.  She had 3 episodes in one month in the past.  I discussed the risks and benefits of ablation.  Risks include but are not limited to bleeding, tamponade, heart block, and stroke, among others.  She  understands the risks and has agreed to the procedure.  Will Curt Bears, MD 05/10/2015 11:41 AM

## 2015-05-13 ENCOUNTER — Encounter (HOSPITAL_COMMUNITY): Payer: Self-pay | Admitting: Cardiology

## 2015-05-13 DIAGNOSIS — I471 Supraventricular tachycardia: Secondary | ICD-10-CM | POA: Insufficient documentation

## 2015-05-20 ENCOUNTER — Telehealth: Payer: Self-pay

## 2015-05-20 DIAGNOSIS — H26492 Other secondary cataract, left eye: Secondary | ICD-10-CM | POA: Diagnosis not present

## 2015-05-20 NOTE — Telephone Encounter (Signed)
Please advise patient is requesting refill on [place]

## 2015-05-21 MED ORDER — ZOLPIDEM TARTRATE 10 MG PO TABS: ORAL_TABLET | ORAL | Status: DC

## 2015-05-21 NOTE — Addendum Note (Signed)
Addended by: Biagio Borg on: 05/21/2015 01:03 PM   Modules accepted: Orders

## 2015-05-21 NOTE — Telephone Encounter (Signed)
Prescription faxed to pharmacy.

## 2015-05-21 NOTE — Telephone Encounter (Signed)
Done hardcopy to Corinne  

## 2015-06-03 NOTE — Progress Notes (Signed)
Electrophysiology Office Note   Date:  06/04/2015   ID:  CHELCEY [name], DOB [birth year], MRN [id number]  PCP:  Cathlean Cower, MD  Cardiologist:  Gwenlyn Found Primary Electrophysiologist:  Stacey Balthazar Meredith Leeds, MD    Chief Complaint  Patient presents with  . Follow-up    post ablation     History of Present Illness: Stacey [name] is a 75 y.o. female who presents today for electrophysiology evaluation.   Her cardiac risk factor profile is notable for 50-pack-years of tobacco abuse currently smoking one pack per day but otherwise is negative. She's never had a heart attack or stroke and denies chest pain or shortness of breath. 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 She's had chemotherapy and radiation therapy. The episodes occur several times a month. She was found to have SVT on event monitor but had a negative EP study 05/10/15.    Today, she denies symptoms of chest pain, shortness of breath, orthopnea, PND, lower extremity edema, claudication, presyncope, syncope, bleeding, or neurologic sequela. The patient is tolerating medications without difficulties and is otherwise without complaint today. She has felt 2 episodes of palpitations since her negative EP study. They were both on Easter 'Sunday. She says that she had dizziness associated with the episodes. They went away on their own with no exacerbating or alleviating factors. She has felt well since that time.   Past Medical History  Diagnosis Date  . ANEMIA-NOS   . ANXIETY   . COPD     resolved  . DIVERTICULOSIS, COLON 2008  . GERD   . HYPERLIPIDEMIA   . IRRITABLE BOWEL SYNDROME, HX OF   . OSTEOARTHRITIS, HAND   . Hx of radiation therapy 10/19/11 -12/03/11    right breast  . Breast cancer (HCC) 1997 L, 2012 R    s/p chemo/xrt  . Dizziness   . PSVT (paroxysmal supraventricular tachycardia) (HCC)     symptomatic on event monitor  . Left-sided carotid artery disease (HCC)     moderate  left ICA stenosis   Past Surgical History  Procedure Laterality Date  . Appendectomy    . Tonsillectomy    . Rectal fissure repair    . Abdominal hysterectomy    . Inguinal herniorrhapy  1984    left  . Tmj arthroplasty  1989  . S/p lumpectomy  1997    melignant left x 2  . S/p benign breast biopsy  2003    right  . S/p left foot surgury  2009  . Spiral fx left foot  2008    no surgury  . Breast surgery      lumpectomy  . Breast biopsy  11/14/10     r breast: inv, insitu mammary carcinoma w/calcif, er/pr +, her2 -  . Hernia repair    . Tongue surgery      19' 88- to remove scar tissue growth   . Cataract extraction      both eyes  . Electrophysiologic study N/A 05/10/2015    Procedure: SVT Ablation;  Surgeon: Rudell Marlowe Meredith Leeds, MD;  Location: [facility];  Service: Cardiovascular;  Laterality: N/A;     Current Outpatient Prescriptions  Medication Sig Dispense Refill  . aspirin 81 MG EC tablet Take 81 mg by mouth daily.      . calcium-vitamin D (OSCAL WITH D) 500-200 MG-UNIT per tablet Take 1 tablet by mouth daily.    . cholecalciferol (VITAMIN D)  1000 UNITS tablet Take 1,000 Units by mouth daily.      . clotrimazole-betamethasone (LOTRISONE) cream Apply 1 application topically 2 (two) times daily as needed.    . diphenhydrAMINE (BENADRYL) 25 MG tablet Take 25 mg by mouth every 6 (six) hours as needed (for bee stings).     . fluticasone (FLONASE) 50 MCG/ACT nasal spray Place 2 sprays into both nostrils daily.    Marland Kitchen loratadine (CLARITIN) 10 MG tablet Take 10 mg by mouth daily.    . meloxicam (MOBIC) 15 MG tablet Take 15 mg by mouth daily.    . Multiple Vitamin (MULTIVITAMIN) capsule Take 1 capsule by mouth daily.      . naproxen sodium (ANAPROX) 220 MG tablet Take 440 mg by mouth daily as needed (for arthritis pain).    Marland Kitchen omeprazole (PRILOSEC) 20 MG capsule Take 1 capsule (20 mg total) by mouth daily. 90 capsule 3  . polyethylene glycol (MIRALAX / GLYCOLAX) packet Take  17 g by mouth daily as needed (constipation).    . pseudoephedrine (SUDAFED) 30 MG tablet Take 30 mg by mouth every 4 (four) hours as needed for congestion.     . tamoxifen (NOLVADEX) 20 MG tablet Take 1 tablet (20 mg total) by mouth daily. 90 tablet 1  . zolpidem (AMBIEN) 10 MG tablet take 1 tablet by mouth at bedtime if needed 90 tablet 1   No current facility-administered medications for this visit.    Allergies:   Bee venom; Clindamycin/lincomycin; and Codeine   Social History:  The patient  reports that she has been smoking Cigarettes.  She has a 10 pack-year smoking history. She has never used smokeless tobacco. She reports that she drinks alcohol. She reports that she does not use illicit drugs.   Family History:  The patient's family history includes Alcohol abuse in her mother; Brain cancer in her cousin; Breast cancer in her cousin, cousin, and cousin; Colon polyps in her mother; Diabetes in her mother; Hypertension in her mother; Lung cancer in her maternal grandfather and paternal uncle; Lung cancer (age of onset: 58) in her maternal grandmother; Lymphoma in her brother; Pancreatic cancer (age of onset: 79) in her mother; Stroke in her mother; Testicular cancer in her cousin; Uterine cancer (age of onset: 62) in her mother. There is no history of Colon cancer, Esophageal cancer, Stomach cancer, or Rectal cancer.    ROS:  Please see the history of present illness.   Otherwise, review of systems is positive for skipped beats, dizziness.   All other systems are reviewed and negative.    PHYSICAL EXAM: VS:  BP 124/68 mmHg  Pulse 93  Ht '5\' 3"'  (1.6 m)  Wt 155 lb 12.8 oz (70.67 kg)  BMI 27.61 kg/m2 , BMI Body mass index is 27.61 kg/(m^2). GEN: Well nourished, well developed, in no acute distress HEENT: normal Neck: no JVD, carotid bruits, or masses Cardiac: RRR; no murmurs, rubs, or gallops,no edema  Respiratory:  clear to auscultation bilaterally, normal work of breathing GI: soft,  nontender, nondistended, + BS MS: no deformity or atrophy Skin: warm and dry Neuro:  Strength and sensation are intact Psych: euthymic mood, full affect  EKG:  EKG is ordered today. ECG shows sinus rhythm, rate 93, APC, LAFB  Recent Labs: 12/12/2014: ALT 11 01/09/2015: TSH 1.89 05/03/2015: BUN 17; Creat 0.81; Hemoglobin 11.9*; Platelets 199; Potassium 4.1; Sodium 142    Lipid Panel     Component Value Date/Time   CHOL 214* 01/09/2015 1650  TRIG 384.0* 01/09/2015 1650   HDL 49.00 01/09/2015 1650   CHOLHDL 4 01/09/2015 1650   VLDL 76.8* 01/09/2015 1650   LDLCALC 84 07/04/2009 0812   LDLDIRECT 111.0 01/09/2015 1650     Wt Readings from Last 3 Encounters:  06/04/15 155 lb 12.8 oz (70.67 kg)  05/10/15 155 lb (70.308 kg)  03/12/15 158 lb 3.2 oz (71.759 kg)      Other studies Reviewed: Additional studies/ records that were reviewed today include: TTE 02/08/15  Review of the above records today demonstrates:  - Left ventricle: The cavity size was normal. Systolic function was normal. The estimated ejection fraction was in the range of 60% to 65%. Wall motion was normal; there were no regional wall motion abnormalities. Doppler parameters are consistent with abnormal left ventricular relaxation (grade 1 diastolic dysfunction). - Aortic valve: There was trivial regurgitation. - Mitral valve: Calcified annulus. Mildly thickened leaflets   Outpatient telemetry: 1. NSR/ST 2. PSVT   ASSESSMENT AND PLAN:  1.  SVT: found on event monitor and associated with dizziness.  Had negative EP study done 05/10/15.  She is feeling well without any complaints of palpitations at this time. She did have 2 episodes on Easter Sunday of palpitations, but has not had any since. I discussed with her the option of repeat EP study versus medical management. She would like to try medical management showed her symptoms return and move towards EP study should she need that in the  future.    Current medicines are reviewed at length with the patient today.   The patient does not have concerns regarding her medicines.  The following changes were made today:  none  Labs/ tests ordered today include:  No orders of the defined types were placed in this encounter.     Disposition:   FU with Rennee Coyne 6 months  Signed, Sumi Lye Meredith Leeds, MD  06/04/2015 3:01 PM     [address] [place] [address] [phone] (office) [phone] (fax)

## 2015-06-04 ENCOUNTER — Ambulatory Visit (INDEPENDENT_AMBULATORY_CARE_PROVIDER_SITE_OTHER): Payer: Medicare Other | Admitting: Cardiology

## 2015-06-04 ENCOUNTER — Encounter: Payer: Self-pay | Admitting: Cardiology

## 2015-06-04 VITALS — BP 124/68 | HR 93 | Ht 63.0 in | Wt 155.8 lb

## 2015-06-04 DIAGNOSIS — I471 Supraventricular tachycardia: Secondary | ICD-10-CM | POA: Diagnosis not present

## 2015-06-04 NOTE — Patient Instructions (Signed)
Medication Instructions:    Your physician recommends that you continue on your current medications as directed. Please refer to the Current Medication list given to you today.  Labwork:  None ordered  Testing/Procedures:  None ordered  Follow-Up:  Your physician wants you to follow-up in: 6 months with Dr. Camnitz.  You will receive a reminder letter in the mail two months in advance. If you don't receive a letter, please call our office to schedule the follow-up appointment.  - If you need a refill on your cardiac medications before your next appointment, please call your pharmacy.    Thank you for choosing [facility]!!   Ridwan Bondy, RN [phone]         

## 2015-06-07 ENCOUNTER — Encounter: Payer: Medicare Other | Admitting: Nurse Practitioner

## 2015-06-25 ENCOUNTER — Telehealth: Payer: Self-pay | Admitting: Internal Medicine

## 2015-06-25 MED ORDER — POLYETHYLENE GLYCOL 3350 17 G PO PACK: 17.0000 g | PACK | Freq: Every day | ORAL | Status: DC | PRN

## 2015-06-25 NOTE — Telephone Encounter (Signed)
Patient is requesting a refill of polyethylene glycol (MIRALAX / GLYCOLAX) packet [id number] to [facility] on [address]

## 2015-06-25 NOTE — Telephone Encounter (Signed)
Medication refill sent to pharmacy  

## 2015-07-05 ENCOUNTER — Other Ambulatory Visit (INDEPENDENT_AMBULATORY_CARE_PROVIDER_SITE_OTHER): Payer: Medicare Other

## 2015-07-05 ENCOUNTER — Encounter: Payer: Self-pay | Admitting: Internal Medicine

## 2015-07-05 ENCOUNTER — Ambulatory Visit (INDEPENDENT_AMBULATORY_CARE_PROVIDER_SITE_OTHER): Payer: Medicare Other | Admitting: Internal Medicine

## 2015-07-05 VITALS — BP 118/62 | HR 92 | Temp 98.4°F | Resp 20 | Wt 154.0 lb

## 2015-07-05 DIAGNOSIS — J42 Unspecified chronic bronchitis: Secondary | ICD-10-CM | POA: Diagnosis not present

## 2015-07-05 DIAGNOSIS — E785 Hyperlipidemia, unspecified: Secondary | ICD-10-CM | POA: Diagnosis not present

## 2015-07-05 DIAGNOSIS — K219 Gastro-esophageal reflux disease without esophagitis: Secondary | ICD-10-CM | POA: Diagnosis not present

## 2015-07-05 DIAGNOSIS — D630 Anemia in neoplastic disease: Secondary | ICD-10-CM

## 2015-07-05 LAB — CBC WITH DIFFERENTIAL/PLATELET
BASOS ABS: 0 10*3/uL (ref 0.0–0.1)
BASOS PCT: 0.2 % (ref 0.0–3.0)
EOS ABS: 0.2 10*3/uL (ref 0.0–0.7)
Eosinophils Relative: 2 % (ref 0.0–5.0)
HEMATOCRIT: 35.8 % — AB (ref 36.0–46.0)
Hemoglobin: 12.1 g/dL (ref 12.0–15.0)
LYMPHS ABS: 2.1 10*3/uL (ref 0.7–4.0)
LYMPHS PCT: 26 % (ref 12.0–46.0)
MCHC: 33.8 g/dL (ref 30.0–36.0)
MCV: 88.7 fl (ref 78.0–100.0)
MONO ABS: 0.5 10*3/uL (ref 0.1–1.0)
Monocytes Relative: 6.7 % (ref 3.0–12.0)
NEUTROS ABS: 5.1 10*3/uL (ref 1.4–7.7)
NEUTROS PCT: 65.1 % (ref 43.0–77.0)
PLATELETS: 298 10*3/uL (ref 150.0–400.0)
RBC: 4.04 Mil/uL (ref 3.87–5.11)
RDW: 13.6 % (ref 11.5–15.5)
WBC: 7.9 10*3/uL (ref 4.0–10.5)

## 2015-07-05 LAB — URINALYSIS, ROUTINE W REFLEX MICROSCOPIC
Bilirubin Urine: NEGATIVE
Ketones, ur: NEGATIVE
Nitrite: NEGATIVE
Specific Gravity, Urine: <=1.005 — AB (ref 1.000–1.030)
Total Protein, Urine: NEGATIVE
URINE GLUCOSE: NEGATIVE
UROBILINOGEN UA: 0.2 (ref 0.0–1.0)
pH: 6 (ref 5.0–8.0)

## 2015-07-05 LAB — LIPID PANEL
CHOLESTEROL: 202 mg/dL — AB (ref 0–200)
HDL: 49.6 mg/dL (ref >39.00)
NONHDL: 152.82
TRIGLYCERIDES: 313 mg/dL — AB (ref 0.0–149.0)
Total CHOL/HDL Ratio: 4
VLDL: 62.6 mg/dL — ABNORMAL HIGH (ref 0.0–40.0)

## 2015-07-05 LAB — BASIC METABOLIC PANEL
BUN: 20 mg/dL (ref 6–23)
CHLORIDE: 104 meq/L (ref 96–112)
CO2: 26 meq/L (ref 19–32)
CREATININE: 0.87 mg/dL (ref 0.40–1.20)
Calcium: 8.9 mg/dL (ref 8.4–10.5)
GFR: 67.41 mL/min (ref >60.00)
Glucose, Bld: 104 mg/dL — ABNORMAL HIGH (ref 70–99)
POTASSIUM: 3.7 meq/L (ref 3.5–5.1)
Sodium: 140 mEq/L (ref 135–145)

## 2015-07-05 LAB — HEPATIC FUNCTION PANEL
ALT: 15 U/L (ref 0–35)
AST: 23 U/L (ref 0–37)
Albumin: 3.9 g/dL (ref 3.5–5.2)
Alkaline Phosphatase: 42 U/L (ref 39–117)
BILIRUBIN DIRECT: 0.1 mg/dL (ref 0.0–0.3)
TOTAL PROTEIN: 6.7 g/dL (ref 6.0–8.3)
Total Bilirubin: 0.3 mg/dL (ref 0.2–1.2)

## 2015-07-05 LAB — TSH: TSH: 1.13 u[IU]/mL (ref 0.35–4.50)

## 2015-07-05 LAB — LDL CHOLESTEROL, DIRECT: LDL DIRECT: 109 mg/dL

## 2015-07-05 MED ORDER — POLYETHYLENE GLYCOL 3350 17 G PO PACK: 17.0000 g | PACK | Freq: Every day | ORAL | Status: DC | PRN

## 2015-07-05 NOTE — Patient Instructions (Signed)

## 2015-07-05 NOTE — Progress Notes (Signed)
Pre visit review using our clinic review tool, if applicable. No additional management support is needed unless otherwise documented below in the visit note. 

## 2015-07-05 NOTE — Assessment & Plan Note (Signed)
stable overall by history and exam, recent data reviewed with pt, and pt to continue medical treatment as before,  to f/u any worsening symptoms or concerns Lab Results  Component Value Date   LDLCALC 84 07/04/2009   For f/u lab

## 2015-07-05 NOTE — Assessment & Plan Note (Signed)
stable overall by history and exam, recent data reviewed with pt, and pt to continue medical treatment as before,  to f/u any worsening symptoms or concerns Lab Results  Component Value Date   WBC 7.8 05/03/2015   HGB 11.9* 05/03/2015   HCT 35.1* 05/03/2015   PLT 199 05/03/2015   GLUCOSE 95 05/03/2015   CHOL 214* 01/09/2015   TRIG 384.0* 01/09/2015   HDL 49.00 01/09/2015   LDLDIRECT 111.0 01/09/2015   LDLCALC 84 07/04/2009   ALT 11 12/12/2014   AST 19 12/12/2014   NA 142 05/03/2015   K 4.1 05/03/2015   CL 106 05/03/2015   CREATININE 0.81 05/03/2015   BUN 17 05/03/2015   CO2 25 05/03/2015   TSH 1.89 01/09/2015

## 2015-07-05 NOTE — Progress Notes (Signed)
Subjective:    Patient ID: Stacey [name], female    DOB: July 27, 1940, 75 y.o.   MRN: [id number]  HPI  Here for yearly f/u;  Overall doing ok;  Pt denies Chest pain, worsening SOB, DOE, wheezing, orthopnea, PND, worsening LE edema,  or syncope but still has occas palpitations and dizziness. .  Pt denies neurological change such as new headache, facial or extremity weakness.  Pt denies polydipsia, polyuria, or low sugar symptoms. Pt states overall good compliance with treatment and medications, good tolerability, and has been trying to follow appropriate diet.  Pt denies worsening depressive symptoms, suicidal ideation or panic. No fever, night sweats, wt loss, loss of appetite, or other constitutional symptoms.  Pt states good ability with ADL's, has low fall risk, home safety reviewed and adequate, no other significant changes in hearing or vision, and only occasionally active with exercise. Did have attempted ablation for SVT, pt dissapointed did not seem to be effective.  For f/u at 6 mo.   Past Medical History  Diagnosis Date  . ANEMIA-NOS   . ANXIETY   . COPD     resolved  . DIVERTICULOSIS, COLON 2008  . GERD   . HYPERLIPIDEMIA   . IRRITABLE BOWEL SYNDROME, HX OF   . OSTEOARTHRITIS, HAND   . Hx of radiation therapy 10/19/11 -12/03/11    right breast  . Breast cancer ([facility]) 1997 L, 2012 R    s/p chemo/xrt  . Dizziness   . PSVT (paroxysmal supraventricular tachycardia) (HCC)     symptomatic on event monitor  . Left-sided carotid artery disease (HCC)     moderate left ICA stenosis   Past Surgical History  Procedure Laterality Date  . Appendectomy    . Tonsillectomy    . Rectal fissure repair    . Abdominal hysterectomy    . Inguinal herniorrhapy  1984    left  . Tmj arthroplasty  1989  . S/p lumpectomy  1997    melignant left x 2  . S/p benign breast biopsy  2003    right  . S/p left foot surgury  2009  . Spiral fx left foot  2008    no surgury  . Breast surgery     lumpectomy  . Breast biopsy  11/14/10     r breast: inv, insitu mammary carcinoma w/calcif, er/pr +, her2 -  . Hernia repair    . Tongue surgery      1988- to remove scar tissue growth   . Cataract extraction      both eyes  . Electrophysiologic study N/A 05/10/2015    Procedure: SVT Ablation;  Surgeon: Will Meredith Leeds, MD;  Location: [facility];  Service: Cardiovascular;  Laterality: N/A;    reports that she has been smoking Cigarettes.  She has a 10 pack-year smoking history. She has never used smokeless tobacco. She reports that she drinks alcohol. She reports that she does not use illicit drugs. family history includes Alcohol abuse in her mother; Brain cancer in her cousin; Breast cancer in her cousin, cousin, and cousin; Colon polyps in her mother; Diabetes in her mother; Hypertension in her mother; Lung cancer in her maternal grandfather and paternal uncle; Lung cancer (age of onset: 45) in her maternal grandmother; Lymphoma in her brother; Pancreatic cancer (age of onset: 86) in her mother; Stroke in her mother; Testicular cancer in her cousin; Uterine cancer (age of onset: 73) in her mother. There is no history of Colon cancer,  Esophageal cancer, Stomach cancer, or Rectal cancer. Allergies  Allergen Reactions  . Bee Venom Swelling  . Clindamycin/Lincomycin Diarrhea and Nausea And Vomiting  . Codeine Nausea And Vomiting   Current Outpatient Prescriptions on File Prior to Visit  Medication Sig Dispense Refill  . aspirin 81 MG EC tablet Take 81 mg by mouth daily.      . calcium-vitamin D (OSCAL WITH D) 500-200 MG-UNIT per tablet Take 1 tablet by mouth daily.    . cholecalciferol (VITAMIN D) 1000 UNITS tablet Take 1,000 Units by mouth daily.      . clotrimazole-betamethasone (LOTRISONE) cream Apply 1 application topically 2 (two) times daily as needed.    . diphenhydrAMINE (BENADRYL) 25 MG tablet Take 25 mg by mouth every 6 (six) hours as needed (for bee stings).     .  fluticasone (FLONASE) 50 MCG/ACT nasal spray Place 2 sprays into both nostrils daily.    Marland Kitchen loratadine (CLARITIN) 10 MG tablet Take 10 mg by mouth daily.    . meloxicam (MOBIC) 15 MG tablet Take 15 mg by mouth daily.    . Multiple Vitamin (MULTIVITAMIN) capsule Take 1 capsule by mouth daily.      . naproxen sodium (ANAPROX) 220 MG tablet Take 440 mg by mouth daily as needed (for arthritis pain).    Marland Kitchen omeprazole (PRILOSEC) 20 MG capsule Take 1 capsule (20 mg total) by mouth daily. 90 capsule 3  . polyethylene glycol (MIRALAX / GLYCOLAX) packet Take 17 g by mouth daily as needed (constipation). 14 each 3  . pseudoephedrine (SUDAFED) 30 MG tablet Take 30 mg by mouth every 4 (four) hours as needed for congestion.     . tamoxifen (NOLVADEX) 20 MG tablet Take 1 tablet (20 mg total) by mouth daily. 90 tablet 1  . zolpidem (AMBIEN) 10 MG tablet take 1 tablet by mouth at bedtime if needed 90 tablet 1   No current facility-administered medications on file prior to visit.    Review of Systems Constitutional: Negative for increased diaphoresis, or other activity, appetite or siginficant weight change other than noted HENT: Negative for worsening hearing loss, ear pain, facial swelling, mouth sores and neck stiffness.   Eyes: Negative for other worsening pain, redness or visual disturbance.  Respiratory: Negative for choking or stridor Cardiovascular: Negative for other chest pain and palpitations.  Gastrointestinal: Negative for worsening diarrhea, blood in stool, or abdominal distention Genitourinary: Negative for hematuria, flank pain or change in urine volume.  Musculoskeletal: Negative for myalgias or other joint complaints.  Skin: Negative for other color change and wound or drainage.  Neurological: Negative for syncope and numbness. other than noted Hematological: Negative for adenopathy. or other swelling Psychiatric/Behavioral: Negative for hallucinations, SI, self-injury, decreased  concentration or other worsening agitation.      Objective:   Physical Exam BP 118/62 mmHg  Pulse 92  Temp(Src) 98.4 F (36.9 C) (Oral)  Resp 20  Wt 154 lb (69.854 kg)  SpO2 96% VS noted,  Constitutional: Pt is oriented to person, place, and time. Appears well-developed and well-nourished, in no significant distress Head: Normocephalic and atraumatic  Eyes: Conjunctivae and EOM are normal. Pupils are equal, round, and reactive to light Right Ear: External ear normal.  Left Ear: External ear normal Nose: Nose normal.  Mouth/Throat: Oropharynx is clear and moist  Neck: Normal range of motion. Neck supple. No JVD present. No tracheal deviation present or significant neck LA or mass Cardiovascular: Normal rate, regular rhythm, normal heart sounds and intact distal  pulses.   Pulmonary/Chest: Effort normal and breath sounds without rales or wheezing  Abdominal: Soft. Bowel sounds are normal. NT. No HSM  Musculoskeletal: Normal range of motion. Exhibits no edema Lymphadenopathy: Has no cervical adenopathy.  Neurological: Pt is alert and oriented to person, place, and time. Pt has normal reflexes. No cranial nerve deficit. Motor grossly intact Skin: Skin is warm and dry. No rash noted or new ulcers Psychiatric:  Has normal mood and affect. Behavior is normal.   Most recent echo summary: Feb 08, 2015   [facility]  [address], [place] [address]  [phone]  ------------------------------------------------------------------- LV EF: 60% - 65%  ------------------------------------------------------------------- Indications: Dizziness (R42).  ------------------------------------------------------------------- History: PMH: Chronic obstructive pulmonary disease. Risk factors: Diverticulosis. IBS. GERD. Constipation. Osteoarthritis. Anemia. Anxiety.  Paresthesia. Fatigue. Breast cancer. Current tobacco use. Dyslipidemia.  ------------------------------------------------------------------- Study Conclusions  - Left ventricle: The cavity size was normal. Systolic function was  normal. The estimated ejection fraction was in the range of 60%  to 65%. Wall motion was normal; there were no regional wall  motion abnormalities. Doppler parameters are consistent with  abnormal left ventricular relaxation (grade 1 diastolic  dysfunction). - Aortic valve: There was trivial regurgitation. - Mitral valve: Calcified annulus. Mildly thickened leaflets .    Assessment & Plan:

## 2015-07-05 NOTE — Assessment & Plan Note (Signed)
Lab Results  Component Value Date   WBC 7.8 05/03/2015   HGB 11.9* 05/03/2015   HCT 35.1* 05/03/2015   MCV 90.7 05/03/2015   PLT 199 05/03/2015   stable overall by history and exam, recent data reviewed with pt, and pt to continue medical treatment as before,  to f/u any worsening symptoms or concerns, for f/u lab

## 2015-07-05 NOTE — Assessment & Plan Note (Signed)
stable overall by history and exam, recent data reviewed with pt, and pt to continue medical treatment as before,  to f/u any worsening symptoms or concerns SpO2 Readings from Last 3 Encounters:  07/05/15 96%  05/10/15 96%  01/09/15 97%

## 2015-08-23 ENCOUNTER — Other Ambulatory Visit: Payer: Self-pay | Admitting: *Deleted

## 2015-08-23 DIAGNOSIS — C50211 Malignant neoplasm of upper-inner quadrant of right female breast: Secondary | ICD-10-CM

## 2015-08-23 MED ORDER — TAMOXIFEN CITRATE 20 MG PO TABS: 20.0000 mg | ORAL_TABLET | Freq: Every day | ORAL | Status: DC

## 2015-08-30 DIAGNOSIS — H40013 Open angle with borderline findings, low risk, bilateral: Secondary | ICD-10-CM | POA: Diagnosis not present

## 2015-08-30 DIAGNOSIS — H01003 Unspecified blepharitis right eye, unspecified eyelid: Secondary | ICD-10-CM | POA: Diagnosis not present

## 2015-08-30 DIAGNOSIS — H04123 Dry eye syndrome of bilateral lacrimal glands: Secondary | ICD-10-CM | POA: Diagnosis not present

## 2015-08-30 DIAGNOSIS — H1859 Other hereditary corneal dystrophies: Secondary | ICD-10-CM | POA: Diagnosis not present

## 2015-09-04 ENCOUNTER — Encounter: Payer: Self-pay | Admitting: Cardiovascular Disease

## 2015-09-04 ENCOUNTER — Ambulatory Visit (INDEPENDENT_AMBULATORY_CARE_PROVIDER_SITE_OTHER): Payer: Medicare Other | Admitting: Cardiovascular Disease

## 2015-09-04 DIAGNOSIS — I779 Disorder of arteries and arterioles, unspecified: Secondary | ICD-10-CM | POA: Diagnosis not present

## 2015-09-04 DIAGNOSIS — I471 Supraventricular tachycardia: Secondary | ICD-10-CM | POA: Diagnosis not present

## 2015-09-04 DIAGNOSIS — I739 Peripheral vascular disease, unspecified: Secondary | ICD-10-CM

## 2015-09-04 DIAGNOSIS — E785 Hyperlipidemia, unspecified: Secondary | ICD-10-CM

## 2015-09-04 NOTE — Assessment & Plan Note (Signed)
History of carotid artery disease with Doppler performed 01/31/15 revealing mild right and moderate left ICA stenosis. This will be followed up on an annual basis

## 2015-09-04 NOTE — Assessment & Plan Note (Signed)
History of PSVT status post attempt at ablation by Dr. Curt Bears 05/10/15 with inability to induce the arrhythmia. Her symptoms were have gotten significantly better over the last 6 months.

## 2015-09-04 NOTE — Progress Notes (Signed)
09/04/2015 Stacey [name]   26-Apr-1940  [id number]  Primary Physician Cathlean Cower, MD Primary Cardiologist: Lorretta Harp MD Renae Gloss  HPI:  Stacey [name] is a 75 year old mildly overweight divorced Caucasian female no children. 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 She was referred by Dr. Jenny Reichmann for evaluation of episodic dizziness which has left several years. The last saw her in the office 03/08/15. Her cardiac risk factor profile is notable for 50-pack-years of tobacco abuse currently smoking one pack per day but otherwise is negative. She's never had a heart attack or stroke and denies chest pain or shortness of breath. 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 She's had chemotherapy and radiation therapy. The episodes occur several times a month. The last several seconds at a time. There is no associated syncope. She had an event monitor that showed PSVT and carotid Dopplers that showed bilateral ICA stenosis in the mild-to-moderate range. She had attempt at SVT ablation by Dr. Curt Bears 05/10/15. He is unable to induce the arrhythmia however since that time her symptoms have somewhat improved. She does continue to smoke one pack per day.   Current Outpatient Prescriptions  Medication Sig Dispense Refill  . aspirin 81 MG EC tablet Take 81 mg by mouth daily.      . calcium-vitamin D (OSCAL WITH D) 500-200 MG-UNIT per tablet Take 1 tablet by mouth daily.    . cholecalciferol (VITAMIN D) 1000 UNITS tablet Take 1,000 Units by mouth daily.      . clotrimazole-betamethasone (LOTRISONE) cream Apply 1 application topically 2 (two) times daily as needed.    . diphenhydrAMINE (BENADRYL) 25 MG tablet Take 25 mg by mouth every 6 (six) hours as needed (for bee stings).     . fluticasone (FLONASE) 50 MCG/ACT nasal spray Place 2 sprays into  both nostrils daily.    Marland Kitchen loratadine (CLARITIN) 10 MG tablet Take 10 mg by mouth daily.    . meloxicam (MOBIC) 15 MG tablet Take 15 mg by mouth daily.    . Multiple Vitamin (MULTIVITAMIN) capsule Take 1 capsule by mouth daily.      . naproxen sodium (ANAPROX) 220 MG tablet Take 440 mg by mouth daily as needed (for arthritis pain).    Marland Kitchen omeprazole (PRILOSEC) 20 MG capsule Take 1 capsule (20 mg total) by mouth daily. 90 capsule 3  . polyethylene glycol (MIRALAX / GLYCOLAX) packet Take 17 g by mouth daily as needed (constipation). 30 each 11  . pseudoephedrine (SUDAFED) 30 MG tablet Take 30 mg by mouth every 4 (four) hours as needed for congestion.     . tamoxifen (NOLVADEX) 20 MG tablet Take 1 tablet (20 mg total) by mouth daily. 90 tablet 1  . zolpidem (AMBIEN) 10 MG tablet take 1 tablet by mouth at bedtime if needed 90 tablet 1   No current facility-administered medications for this visit.     Allergies  Allergen Reactions  . Bee Venom Swelling  . Clindamycin/Lincomycin Diarrhea and Nausea And Vomiting  . Codeine Nausea And Vomiting    Social History   Social History  . Marital status: Divorced    Spouse name: N/A  . Number of children: 0  . Years of education: N/A   Occupational History  . retired Banker    Social History Main Topics  .  Smoking status: Current Some Day Smoker    Packs/day: 0.25    Years: 40.00    Types: Cigarettes  . Smokeless tobacco: Never Used  . Alcohol use Yes     Comment: rare/ drinks socially  . Drug use: No  . Sexual activity: Not on file   Other Topics Concern  . Not on file   Social History Narrative   Patient gets no regular exercise   No biological children   2 step children     Review of Systems: General: negative for chills, fever, night sweats or weight changes.  Cardiovascular: negative for chest pain, dyspnea on exertion, edema, orthopnea, palpitations, paroxysmal nocturnal dyspnea or shortness of breath Dermatological:  negative for rash Respiratory: negative for cough or wheezing Urologic: negative for hematuria Abdominal: negative for nausea, vomiting, diarrhea, bright red blood per rectum, melena, or hematemesis Neurologic: negative for visual changes, syncope, or dizziness All other systems reviewed and are otherwise negative except as noted above.    Blood pressure 136/75, pulse 96, height 5\' 4"  (1.626 m), weight 155 lb (70.3 kg).  General appearance: alert and no distress Neck: no adenopathy, no JVD, supple, symmetrical, trachea midline, thyroid not enlarged, symmetric, no tenderness/mass/nodules and Soft left carotid bruit Lungs: clear to auscultation bilaterally Heart: regular rate and rhythm, S1, S2 normal, no murmur, click, rub or gallop Extremities: extremities normal, atraumatic, no cyanosis or edema  EKG not performed today  ASSESSMENT AND PLAN:   Hyperlipidemia History of hyperlipidemia on statin therapy with recent lipid profile performed 07/05/15 revealed total cholesterol 202 for treadmill was for a level of 313. She does admit to dietary indiscretion regards to sugar and candy.  Left-sided carotid artery disease ([place]) History of carotid artery disease with Doppler performed 01/31/15 revealing mild right and moderate left ICA stenosis. This will be followed up on an annual basis  SVT (supraventricular tachycardia) ([place]) History of PSVT status post attempt at ablation by Dr. Curt Bears 05/10/15 with inability to induce the arrhythmia. Her symptoms were have gotten significantly better over the last 6 months.      Lorretta Harp MD FACP,FACC,FAHA, [facility] 09/04/2015 1:43 PM

## 2015-09-04 NOTE — Patient Instructions (Signed)
Medication Instructions:  Your physician recommends that you continue on your current medications as directed. Please refer to the Current Medication list given to you today.   Follow-Up: Your physician wants you to follow-up in: 12 MONTHS WITH DR BERRY. You will receive a reminder letter in the mail two months in advance. If you don't receive a letter, please call our office to schedule the follow-up appointment.   If you need a refill on your cardiac medications before your next appointment, please call your pharmacy.   

## 2015-09-04 NOTE — Assessment & Plan Note (Signed)
History of hyperlipidemia on statin therapy with recent lipid profile performed 07/05/15 revealed total cholesterol 202 for treadmill was for a level of 313. She does admit to dietary indiscretion regards to sugar and candy.

## 2015-09-05 DIAGNOSIS — L821 Other seborrheic keratosis: Secondary | ICD-10-CM | POA: Diagnosis not present

## 2015-09-05 DIAGNOSIS — L570 Actinic keratosis: Secondary | ICD-10-CM | POA: Diagnosis not present

## 2015-09-05 DIAGNOSIS — Z86018 Personal history of other benign neoplasm: Secondary | ICD-10-CM | POA: Diagnosis not present

## 2015-09-05 DIAGNOSIS — D1800 Hemangioma unspecified site: Secondary | ICD-10-CM | POA: Diagnosis not present

## 2015-09-05 DIAGNOSIS — L814 Other melanin hyperpigmentation: Secondary | ICD-10-CM | POA: Diagnosis not present

## 2015-09-05 DIAGNOSIS — D225 Melanocytic nevi of trunk: Secondary | ICD-10-CM | POA: Diagnosis not present

## 2015-09-05 DIAGNOSIS — D485 Neoplasm of uncertain behavior of skin: Secondary | ICD-10-CM | POA: Diagnosis not present

## 2015-09-06 DIAGNOSIS — L821 Other seborrheic keratosis: Secondary | ICD-10-CM | POA: Diagnosis not present

## 2015-11-05 DIAGNOSIS — Z23 Encounter for immunization: Secondary | ICD-10-CM | POA: Diagnosis not present

## 2015-11-13 DIAGNOSIS — Z961 Presence of intraocular lens: Secondary | ICD-10-CM | POA: Diagnosis not present

## 2015-11-13 DIAGNOSIS — H353121 Nonexudative age-related macular degeneration, left eye, early dry stage: Secondary | ICD-10-CM | POA: Diagnosis not present

## 2015-11-13 DIAGNOSIS — H353112 Nonexudative age-related macular degeneration, right eye, intermediate dry stage: Secondary | ICD-10-CM | POA: Diagnosis not present

## 2015-11-13 DIAGNOSIS — H40013 Open angle with borderline findings, low risk, bilateral: Secondary | ICD-10-CM | POA: Diagnosis not present

## 2015-11-21 ENCOUNTER — Encounter: Payer: Self-pay | Admitting: Cardiology

## 2015-12-03 ENCOUNTER — Ambulatory Visit: Payer: Medicare Other | Admitting: Cardiology

## 2015-12-05 ENCOUNTER — Encounter: Payer: Self-pay | Admitting: Internal Medicine

## 2015-12-05 ENCOUNTER — Other Ambulatory Visit: Payer: Medicare Other

## 2015-12-05 DIAGNOSIS — Z853 Personal history of malignant neoplasm of breast: Secondary | ICD-10-CM | POA: Diagnosis not present

## 2015-12-06 ENCOUNTER — Telehealth: Payer: Self-pay | Admitting: Oncology

## 2015-12-06 NOTE — Telephone Encounter (Signed)
Appointments rescheduled per patient request. Patient could not come to 11/2 appointment per car trouble.

## 2015-12-09 ENCOUNTER — Other Ambulatory Visit (HOSPITAL_BASED_OUTPATIENT_CLINIC_OR_DEPARTMENT_OTHER): Payer: Medicare Other

## 2015-12-09 DIAGNOSIS — C50211 Malignant neoplasm of upper-inner quadrant of right female breast: Secondary | ICD-10-CM

## 2015-12-09 LAB — CBC WITH DIFFERENTIAL/PLATELET
BASO%: 0.2 % (ref 0.0–2.0)
Basophils Absolute: 0 10*3/uL (ref 0.0–0.1)
EOS%: 1.8 % (ref 0.0–7.0)
Eosinophils Absolute: 0.2 10*3/uL (ref 0.0–0.5)
HEMATOCRIT: 36.2 % (ref 34.8–46.6)
HGB: 12.4 g/dL (ref 11.6–15.9)
LYMPH#: 2.1 10*3/uL (ref 0.9–3.3)
LYMPH%: 24.6 % (ref 14.0–49.7)
MCH: 30.8 pg (ref 25.1–34.0)
MCHC: 34.3 g/dL (ref 31.5–36.0)
MCV: 89.8 fL (ref 79.5–101.0)
MONO#: 0.5 10*3/uL (ref 0.1–0.9)
MONO%: 5.9 % (ref 0.0–14.0)
NEUT%: 67.5 % (ref 38.4–76.8)
NEUTROS ABS: 5.7 10*3/uL (ref 1.5–6.5)
Platelets: 238 10*3/uL (ref 145–400)
RBC: 4.03 10*6/uL (ref 3.70–5.45)
RDW: 13.2 % (ref 11.2–14.5)
WBC: 8.5 10*3/uL (ref 3.9–10.3)

## 2015-12-09 LAB — COMPREHENSIVE METABOLIC PANEL
ALT: 13 U/L (ref 0–55)
AST: 20 U/L (ref 5–34)
Albumin: 3.3 g/dL — ABNORMAL LOW (ref 3.5–5.0)
Alkaline Phosphatase: 54 U/L (ref 40–150)
Anion Gap: 9 mEq/L (ref 3–11)
BUN: 16.7 mg/dL (ref 7.0–26.0)
CALCIUM: 9.4 mg/dL (ref 8.4–10.4)
CHLORIDE: 106 meq/L (ref 98–109)
CO2: 25 meq/L (ref 22–29)
CREATININE: 0.8 mg/dL (ref 0.6–1.1)
EGFR: 69 mL/min/{1.73_m2} — ABNORMAL LOW (ref >90)
Glucose: 102 mg/dl (ref 70–140)
Potassium: 4.2 mEq/L (ref 3.5–5.1)
Sodium: 140 mEq/L (ref 136–145)
Total Bilirubin: <0.22 mg/dL (ref 0.20–1.20)
Total Protein: 7 g/dL (ref 6.4–8.3)

## 2015-12-15 ENCOUNTER — Other Ambulatory Visit: Payer: Self-pay | Admitting: Internal Medicine

## 2015-12-18 ENCOUNTER — Encounter: Payer: Self-pay | Admitting: Cardiology

## 2015-12-18 ENCOUNTER — Ambulatory Visit (INDEPENDENT_AMBULATORY_CARE_PROVIDER_SITE_OTHER): Payer: Medicare Other | Admitting: Cardiology

## 2015-12-18 VITALS — BP 144/88 | HR 78 | Ht 64.5 in | Wt 160.2 lb

## 2015-12-18 DIAGNOSIS — I471 Supraventricular tachycardia: Secondary | ICD-10-CM

## 2015-12-18 NOTE — Patient Instructions (Signed)
Medication Instructions:  Your physician recommends that you continue on your current medications as directed. Please refer to the Current Medication list given to you today.   Labwork: none  Testing/Procedures: none  Follow-Up: Your physician recommends that you schedule a follow-up appointment as needed.    Any Other Special Instructions Will Be Listed Below (If Applicable).     If you need a refill on your cardiac medications before your next appointment, please call your pharmacy.   

## 2015-12-18 NOTE — Progress Notes (Signed)
Electrophysiology Office Note   Date:  12/18/2015   ID:  Stacey [name], DOB [birth year], MRN [id number]  PCP:  Cathlean Cower, MD  Cardiologist:  Stacey [name] Primary Electrophysiologist:  Stacey [name] Meredith Leeds, MD    Chief Complaint  Patient presents with  . Follow-up    SVT     History of Present Illness: Stacey [name] is a 75 y.o. female who presents today for electrophysiology evaluation.   Her cardiac risk factor profile is notable for 50-pack-years of tobacco abuse currently smoking one pack per day but otherwise is negative. She's never had a heart attack or stroke and denies chest pain or shortness of breath. 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 She's had chemotherapy and radiation therapy. The episodes occur several times a month. She was [name] to have SVT on event monitor but had a negative EP study 05/10/15.    Today, she denies symptoms of palpitations, chest pain, shortness of breath, orthopnea, PND, lower extremity edema, claudication, presyncope, syncope, bleeding, or neurologic sequela. The patient is tolerating medications without difficulties and is otherwise without complaint today.     Past Medical History:  Diagnosis Date  . ANEMIA-NOS   . ANXIETY   . Breast cancer ([place]) 1997 L, 2012 R   s/p chemo/xrt  . COPD    resolved  . DIVERTICULOSIS, COLON 2008  . Dizziness   . GERD   . Hx of radiation therapy 10/19/11 -12/03/11   right breast  . HYPERLIPIDEMIA   . IRRITABLE BOWEL SYNDROME, HX OF   . Left-sided carotid artery disease ([facility])    moderate left ICA stenosis  . OSTEOARTHRITIS, HAND   . PSVT (paroxysmal supraventricular tachycardia) ([facility])    symptomatic on event monitor   Past Surgical History:  Procedure Laterality Date  . ABDOMINAL HYSTERECTOMY    . APPENDECTOMY    . BREAST BIOPSY  11/14/10    r breast: inv, insitu mammary carcinoma w/calcif, er/pr +, her2 -  . BREAST SURGERY     lumpectomy  . CATARACT  EXTRACTION     both eyes  . ELECTROPHYSIOLOGIC STUDY N/A 05/10/2015   Procedure: SVT Ablation;  Surgeon: Elford Evilsizer Meredith Leeds, MD;  Location: [place] [facility];  Service: Cardiovascular;  Laterality: N/A;  . HERNIA REPAIR    . inguinal herniorrhapy  1984   left  . rectal fissure repair    . s/p benign breast biopsy  2003   right  . s/p left foot surgury  2009  . 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 spiral fx left foot  2008   no surgury  . TMJ ARTHROPLASTY  1989  . [place]- to remove scar tissue growth   . TONSILLECTOMY       Current Outpatient Prescriptions  Medication Sig Dispense Refill  . aspirin 81 MG EC tablet Take 81 mg by mouth daily.      . calcium-vitamin D (OSCAL WITH D) 500-200 MG-UNIT per tablet Take 1 tablet by mouth daily.    . cholecalciferol (VITAMIN D) 1000 UNITS tablet Take 1,000 Units by mouth daily.      . clotrimazole-betamethasone (LOTRISONE) cream Apply 1 application topically 2 (two) times daily as needed.    . diphenhydrAMINE (BENADRYL) 25 MG tablet Take 25 mg by mouth every 6 (six) hours as needed (for bee stings).     . fluticasone (FLONASE) 50  MCG/ACT nasal spray Place 2 sprays into both nostrils daily.    Marland Kitchen loratadine (CLARITIN) 10 MG tablet Take 10 mg by mouth daily.    . meloxicam (MOBIC) 15 MG tablet Take 15 mg by mouth daily.    . Multiple Vitamin (MULTIVITAMIN) capsule Take 1 capsule by mouth daily.      . naproxen sodium (ANAPROX) 220 MG tablet Take 440 mg by mouth daily as needed (for arthritis pain).    Marland Kitchen omeprazole (PRILOSEC) 20 MG capsule take 1 capsule by mouth once daily 90 capsule 1  . polyethylene glycol (MIRALAX / GLYCOLAX) packet Take 17 g by mouth daily as needed (constipation). 30 each 11  . pseudoephedrine (SUDAFED) 30 MG tablet Take 30 mg by mouth every 4 (four) hours as needed for congestion.     . tamoxifen (NOLVADEX) 20 MG tablet Take 1 tablet (20 mg total) by mouth daily. 90 tablet 1  . zolpidem (AMBIEN)  10 MG tablet take 1 tablet by mouth at bedtime if needed 90 tablet 1   No current facility-administered medications for this visit.     Allergies:   Bee venom; Clindamycin/lincomycin; and Codeine   Social History:  The patient  reports that she has been smoking Cigarettes.  She has a 10.00 pack-year smoking history. She has never used smokeless tobacco. She reports that she drinks alcohol. She reports that she does not use drugs.   Family History:  The patient's family history includes Alcohol abuse in her mother; Brain cancer in her cousin; Breast cancer in her cousin, cousin, and cousin; Colon polyps in her mother; Diabetes in her mother; Hypertension in her mother; Lung cancer in her maternal grandfather and paternal uncle; Lung cancer (age of onset: 45) in her maternal grandmother; Lymphoma in her brother; Pancreatic cancer (age of onset: 15) in her mother; Stroke in her mother; Testicular cancer in her cousin; Uterine cancer (age of onset: 99) in her mother.    ROS:  Please see the history of present illness.   Otherwise, review of systems is positive for none.   All other systems are reviewed and negative.    PHYSICAL EXAM: VS:  Ht 5' 4.5" (1.638 m)   Wt 160 lb 3.2 oz (72.7 kg)   BMI 27.07 kg/m  , BMI Body mass index is 27.07 kg/m. GEN: Well nourished, well developed, in no acute distress  HEENT: normal  Neck: no JVD, carotid bruits, or masses Cardiac: RRR; no murmurs, rubs, or gallops,no edema  Respiratory:  clear to auscultation bilaterally, normal work of breathing GI: soft, nontender, nondistended, + BS MS: no deformity or atrophy  Skin: warm and dry Neuro:  Strength and sensation are intact Psych: euthymic mood, full affect  EKG:  EKG is ordered today.  Personal review of the ECG 06/04/15 shows sinus rhythm, rate 93, APC, LAFB   Recent Labs: 07/05/2015: TSH 1.13 12/09/2015: ALT 13; BUN 16.7; Creatinine 0.8; HGB 12.4; Platelets 238; Potassium 4.2; Sodium 140    Lipid  Panel     Component Value Date/Time   CHOL 202 (H) 07/05/2015 1346   TRIG 313.0 (H) 07/05/2015 1346   HDL 49.60 07/05/2015 1346   CHOLHDL 4 07/05/2015 1346   VLDL 62.6 (H) 07/05/2015 1346   LDLCALC 84 07/04/2009 0812   LDLDIRECT 109.0 07/05/2015 1346     Wt Readings from Last 3 Encounters:  12/18/15 160 lb 3.2 oz (72.7 kg)  09/04/15 155 lb (70.3 kg)  07/05/15 154 lb (69.9 kg)  Other studies Reviewed: Additional studies/ records that were reviewed today include: TTE 02/08/15  Review of the above records today demonstrates:  - Left ventricle: The cavity size was normal. Systolic function was normal. The estimated ejection fraction was in the range of 60% to 65%. Wall motion was normal; there were no regional wall motion abnormalities. Doppler parameters are consistent with abnormal left ventricular relaxation (grade 1 diastolic dysfunction). - Aortic valve: There was trivial regurgitation. - Mitral valve: Calcified annulus. Mildly thickened leaflets   Outpatient telemetry: 1. NSR/ST 2. PSVT   ASSESSMENT AND PLAN:  1.  SVT: [name] on event monitor and associated with dizziness.  Had negative EP study done 05/10/15.  She has been feeling well without major complaint. She feels like her palpitations have gone away. We Brylinn Teaney therefore continue the current monitoring strategy. She Teriann Livingood call us back if she feels like she needs to be seen. Of note her blood pressure was elevated today, but she was waiting in the waiting room for an extended period of time and got quite anxious. We Jasmen Emrich not treat her blood pressure at the moment.     Current medicines are reviewed at length with the patient today.   The patient does not have concerns regarding her medicines.  The following changes were made today:  none  Labs/ tests ordered today include:  No orders of the defined types were placed in this encounter.    Disposition:   FU with Enna Warwick PRN  Signed, [employer] Meredith Leeds, MD  12/18/2015 4:37 PM     [address] [place] [address] [phone] (office) [phone] (fax)

## 2015-12-19 ENCOUNTER — Ambulatory Visit (HOSPITAL_BASED_OUTPATIENT_CLINIC_OR_DEPARTMENT_OTHER): Payer: Medicare Other | Admitting: Oncology

## 2015-12-19 ENCOUNTER — Other Ambulatory Visit: Payer: Medicare Other

## 2015-12-19 VITALS — BP 144/86 | HR 89 | Temp 98.4°F | Resp 17 | Ht 64.5 in | Wt 158.8 lb

## 2015-12-19 DIAGNOSIS — Z720 Tobacco use: Secondary | ICD-10-CM | POA: Diagnosis not present

## 2015-12-19 DIAGNOSIS — M8580 Other specified disorders of bone density and structure, unspecified site: Secondary | ICD-10-CM

## 2015-12-19 DIAGNOSIS — Z170 Estrogen receptor positive status [ER+]: Principal | ICD-10-CM

## 2015-12-19 DIAGNOSIS — R609 Edema, unspecified: Secondary | ICD-10-CM

## 2015-12-19 DIAGNOSIS — Z853 Personal history of malignant neoplasm of breast: Secondary | ICD-10-CM

## 2015-12-19 DIAGNOSIS — C50211 Malignant neoplasm of upper-inner quadrant of right female breast: Secondary | ICD-10-CM

## 2015-12-19 NOTE — Progress Notes (Signed)
ID: [name]   DOB: 09/19/1940  MR#: [id number]  CXK#:[id number]  PCP: Cathlean Cower, MD GYN:  Dr. Susette Racer SU:  Dr. Lucia Gaskins  OTHER MD:  HISTORY OF PRESENT ILLNESS:  From the original intake note:  " Stacey [name] is a 75 year old [place] woman, who has a history of breast cancer starting in 1997 and in 2013.    On 11/22/95, she underwent a left breast biopsy, which revealed malignant cells.  She had a left breast lumpectomy with re-excision on 11/29/95, with final pathology revealed IDC, grade 2, 2.5 cm, ER 98%, PR 97%, Ki67 8% with one of 15 nodes being positive.  She received chemotherapy, 4 cycles per patient, along with radiation and took Tamoxifen for 7 years following radiation completion.  Records from 1997 limited.    In October 2012, she had a biopsy of the right breast after mammography recommended additional images for a dense area in the right breast.  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  On 11/25/10, she had an MRI that measured the area in the right breast as 2.2 cm.  She started neoadjuvant [name] in November 2012 and has continued it since.  The area of concern measured 1.7 cm on a repeat MRI in March of 2013.  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  Surgical margins were negative with one of one sentinel lymph node positive for metastatic carcinoma.  She then underwent radiation therapy under the care of Dr. [name] from 10/19/11 to 12/03/11. "   Subsequent history is as detailed below  INTERVAL HISTORY:   Stacey [name] returns today for followup of her bilateral breast cancers. She continues on tamoxifen but "I hate that drug". She blames that for her increasing fatigue and shortness of breath.  Unfortunately she continues to smoke, which isn't more likely cause for these problems. Nevertheless she would like to get off that medication and "seeing what happens".  REVIEW OF SYSTEMS: She was recently evaluated by cardiology for palpitations and these have improved. She had a negative EP study in April. Aside from the fatigue and shortness of breath she gets cramps which indeed could be related to the tamoxifen. She has other joint pains and aches here and there which are not more persistent or intense than prior. She has stress urinary incontinence. She has hot flashes. The one thing that is bothering her the most is some swelling of the left forearm and hand, which is very intermittent. Some mornings she also has a little bit of numbness in the hand. She tries to exercise regularly using a treadmill, but finds this extremely poor. She spends a great deal of time reading. A detailed review of systems today was otherwise stable   PAST MEDICAL HISTORY: Past Medical History:  Diagnosis Date  . ANEMIA-NOS   . ANXIETY   . Breast cancer ([place]) 1997 L, 2012 R   s/p chemo/xrt  . COPD    resolved  . DIVERTICULOSIS, COLON 2008  . Dizziness   . GERD   . Hx of radiation therapy 10/19/11 -12/03/11   right breast  . HYPERLIPIDEMIA   . IRRITABLE BOWEL SYNDROME, HX OF   . Left-sided carotid artery disease (HCC)    moderate left ICA  stenosis  . OSTEOARTHRITIS, HAND   . PSVT (paroxysmal supraventricular tachycardia) ([place])    symptomatic on event monitor    PAST SURGICAL HISTORY: Past Surgical History:  Procedure Laterality Date  . ABDOMINAL HYSTERECTOMY    . APPENDECTOMY    . BREAST BIOPSY  11/14/10    r breast: inv, insitu mammary carcinoma w/calcif, er/pr +, her2 -  . BREAST SURGERY     lumpectomy  . CATARACT EXTRACTION     both eyes  . ELECTROPHYSIOLOGIC STUDY N/A 05/10/2015   Procedure: SVT Ablation;  Surgeon: Will Meredith Leeds, MD;  Location: [facility];  Service:  Cardiovascular;  Laterality: N/A;  . HERNIA REPAIR    . inguinal herniorrhapy  1984   left  . rectal fissure repair    . s/p benign breast biopsy  2003   right  . s/p left foot surgury  2009  . 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 spiral fx left foot  2008   no surgury  . TMJ ARTHROPLASTY  1989  . [place]- to remove scar tissue growth   . TONSILLECTOMY      FAMILY HISTORY Family History  Problem Relation Age of Onset  . Alcohol abuse Mother     ETOH dependence  . Hypertension Mother   . Stroke Mother   . Colon polyps Mother   . Diabetes Mother   . Pancreatic cancer Mother 20  . Uterine cancer Mother 32  . Lymphoma Brother     burkitts  . Lung cancer Paternal Uncle   . Lung cancer Maternal Grandmother 49    non-smoker  . Lung cancer Maternal Grandfather   . Breast cancer Cousin     maternal cousin, dx in her mid 47s  . Brain cancer Cousin     maternal cousin's son; dx in his 58s  . Testicular cancer Cousin     maternal cousin's son;   . Breast cancer Cousin     paternal cousin; dx in her 98s  . Breast cancer Cousin     1 maternal, 2 paternal  . Colon cancer Neg Hx   . Esophageal cancer Neg Hx   . Stomach cancer Neg Hx   . Rectal cancer Neg Hx     GYNECOLOGIC HISTORY:   Menarche age 12, GX P0 (one miscarriage at 5 months). Status post vaginal hysterectomy in the late 1970s, no salpingo-oophorectomy   SOCIAL HISTORY:  The patient lives alone and is divorced. She retired from [employer], were she worked in Physiological scientist.Marland Kitchen     ADVANCED DIRECTIVES:  Living will in place  HEALTH MAINTENANCE: Social History  Substance Use Topics  . Smoking status: Current Some Day Smoker    Packs/day: 0.25    Years: 40.00    Types: Cigarettes  . Smokeless tobacco: Never Used  . Alcohol use Yes     Comment: rare/ drinks socially     Colonoscopy:  Repeat due DEC 2015  PAP:  S/p remote hysterectomy, no SO  Bone density:  FEB 2015: T - 1.8  Lipid  panel:    Allergies  Allergen Reactions  . Bee Venom Swelling  . Clindamycin/Lincomycin Diarrhea and Nausea And Vomiting  . Codeine Nausea And Vomiting    Current Outpatient Prescriptions  Medication Sig Dispense Refill  . aspirin 81 MG EC tablet Take 81 mg by mouth daily.      . calcium-vitamin D (OSCAL WITH D) 500-200  MG-UNIT per tablet Take 1 tablet by mouth daily.    . cholecalciferol (VITAMIN D) 1000 UNITS tablet Take 1,000 Units by mouth daily.      . clotrimazole-betamethasone (LOTRISONE) cream Apply 1 application topically 2 (two) times daily as needed.    . diphenhydrAMINE (BENADRYL) 25 MG tablet Take 25 mg by mouth every 6 (six) hours as needed (for bee stings).     . fluticasone (FLONASE) 50 MCG/ACT nasal spray Place 2 sprays into both nostrils daily.    Marland Kitchen loratadine (CLARITIN) 10 MG tablet Take 10 mg by mouth daily.    . meloxicam (MOBIC) 15 MG tablet Take 15 mg by mouth daily.    . Multiple Vitamin (MULTIVITAMIN) capsule Take 1 capsule by mouth daily.      . naproxen sodium (ANAPROX) 220 MG tablet Take 440 mg by mouth daily as needed (for arthritis pain).    Marland Kitchen omeprazole (PRILOSEC) 20 MG capsule take 1 capsule by mouth once daily 90 capsule 1  . polyethylene glycol (MIRALAX / GLYCOLAX) packet Take 17 g by mouth daily as needed (constipation). 30 each 11  . pseudoephedrine (SUDAFED) 30 MG tablet Take 30 mg by mouth every 4 (four) hours as needed for congestion.     Marland Kitchen zolpidem (AMBIEN) 10 MG tablet take 1 tablet by mouth at bedtime if needed 90 tablet 1   No current facility-administered medications for this visit.     OBJECTIVE: Older white woman Who appears stated age 18:   12/19/15 1401  BP: (!) 144/86  Pulse: 89  Resp: 17  Temp: 98.4 F (36.9 C)     Body mass index is 26.84 kg/m.    ECOG FS:  1  Sclerae unicteric, EOMs intact Oropharynx clear and moist No cervical or supraclavicular adenopathy Lungs no rales or rhonchi Heart regular rate and rhythm Abd  soft, nontender, positive bowel sounds MSK no focal spinal tenderness, grade 1 lymphedema involving the left lower arm only, no erythema or tenderness Neuro: nonfocal, well oriented, appropriate affect Breasts: Both breasts are status post lumpectomy and radiation and of course have posttreatment changes, but no new findings and nothing suspicious for local recurrence. Both axillae are benign.   LAB RESULTS: Lab Results  Component Value Date   WBC 8.5 12/09/2015   NEUTROABS 5.7 12/09/2015   HGB 12.4 12/09/2015   HCT 36.2 12/09/2015   MCV 89.8 12/09/2015   PLT 238 12/09/2015      Chemistry      Component Value Date/Time   NA 140 12/09/2015 1409   K 4.2 12/09/2015 1409   CL 104 07/05/2015 1346   CL 107 02/02/2012 0939   CO2 25 12/09/2015 1409   BUN 16.7 12/09/2015 1409   CREATININE 0.8 12/09/2015 1409      Component Value Date/Time   CALCIUM 9.4 12/09/2015 1409   ALKPHOS 54 12/09/2015 1409   AST 20 12/09/2015 1409   ALT 13 12/09/2015 1409   BILITOT <0.22 12/09/2015 1409       Lab Results  Component Value Date   LABCA2 56 (H) 12/10/2010    No components found for: [id number]  No results for input(s): INR in the last 168 hours.  Urinalysis    Component Value Date/Time   COLORURINE YELLOW 07/05/2015 1346   APPEARANCEUR CLEAR 07/05/2015 1346   LABSPEC <=1.005 (A) 07/05/2015 1346   PHURINE 6.0 07/05/2015 1346   GLUCOSEU NEGATIVE 07/05/2015 1346   HGBUR TRACE-INTACT (A) 07/05/2015 1346   BILIRUBINUR NEGATIVE 07/05/2015 1346  KETONESUR NEGATIVE 07/05/2015 1346   UROBILINOGEN 0.2 07/05/2015 1346   NITRITE NEGATIVE 07/05/2015 1346   LEUKOCYTESUR SMALL (A) 07/05/2015 1346    STUDIES: Bone density 04/02/2015 reviewed with patient  ASSESSMENT: 75 y.o.  woman:  #1  S/P LEFT breast lumpectomy with re-excision on 11/29/95 for a T2 N1bi IDC of the left breast, grade 2, Stage IIB, ER 98%, PR 97%, Ki67 8%.  #2  She underwent 4 cycles of AC followed by  radiation therapy under the care of Dr. [name].  #3  She took Tamoxifen for a total of seven years   #4  S/P biopsy of the RIGHT breast Upper inner quadrant on 11/14/10 showing invasive ductal carcinoma,, grade 2, ER 85%, PR 57%, Ki67 20%, HER2 not amplified.    #5  She started neoadjuvant letrozole in November 2012; switched to tamoxifen as of February 2016 due to osteopenia concerns  #6  S/P right lumpectomy with sentinel lymph node biopsy on 09/03/11 for a ypT2, ypN1a, stage IIB invasive lobular carcinoma, grade 2,estrogen receptor 97% positive, progesterone receptor 12% positive, with no HER-2 amplification.  #7  She underwent radiation therapy under the care of Dr. [name] from 10/19/11 to 12/03/11.   #8 patient does not meet criteria for genetic testing according to her insurance company.  #9 osteopenia, with a T score of -1.8 on DEX[facility] scan [facility] 03/07/2013  (a) status post multiple dental extractions and implant-not a bisphosphonate candidate  (b) repeat [facility] density [facility] 04/02/2015 shows a T score of -2.0  PLAN:   Stacey [name] is now 4 years out from definitive surgery for her right breast cancer with no evidence of disease recurrence. This is very favorable.   She was on tamoxifen originally for 7 years, and has had an additional 5 years of anti-estrogens beginning on November 2012. Although I would've preferred that she continued on tamoxifen another year (so it would be 5 years from her definitive surgery) she is "done" as far as anti-estrogens are concerned.    The other issue we discussed today was her left elbow and left forearm swelling. This is very minimal. It is intermittent. I don't think it is going to be related to a blood clot or to her breast cancer. I think it is related to something in the elbow, which I offered to image but she would prefer to discuss that with her primary care physician Dr. Jenny Reichmann and that seems most appropriate.  Accordingly she is being released  from follow-up today. All she needs in terms of screening for breast cancer is her yearly mammography and yearly physician breast exam.  We discussed our survivorship program but she is not interested in participating.  I will be glad to see Stacey [name] at any point in the future if on when the need arises but as of now were making no routine appointments for her.   Stacey [name] C    12/19/2015

## 2015-12-25 ENCOUNTER — Ambulatory Visit (INDEPENDENT_AMBULATORY_CARE_PROVIDER_SITE_OTHER): Payer: Medicare Other | Admitting: Internal Medicine

## 2015-12-25 ENCOUNTER — Ambulatory Visit (HOSPITAL_COMMUNITY)
Admission: RE | Admit: 2015-12-25 | Discharge: 2015-12-25 | Disposition: A | Discharge status: Home / Self Care | Payer: Medicare Other | Source: Ambulatory Visit | Attending: Internal Medicine | Admitting: Internal Medicine

## 2015-12-25 ENCOUNTER — Encounter (HOSPITAL_COMMUNITY): Payer: Self-pay | Admitting: Cardiology

## 2015-12-25 ENCOUNTER — Encounter: Payer: Self-pay | Admitting: Family Medicine

## 2015-12-25 ENCOUNTER — Ambulatory Visit (INDEPENDENT_AMBULATORY_CARE_PROVIDER_SITE_OTHER): Payer: Medicare Other | Admitting: Family Medicine

## 2015-12-25 ENCOUNTER — Ambulatory Visit: Payer: Self-pay

## 2015-12-25 ENCOUNTER — Encounter: Payer: Self-pay | Admitting: Internal Medicine

## 2015-12-25 VITALS — BP 138/78 | HR 72 | Temp 98.4°F | Resp 20 | Wt 158.0 lb

## 2015-12-25 VITALS — BP 138/78 | HR 72 | Ht 64.5 in | Wt 158.0 lb

## 2015-12-25 DIAGNOSIS — M7989 Other specified soft tissue disorders: Secondary | ICD-10-CM

## 2015-12-25 DIAGNOSIS — M79602 Pain in left arm: Secondary | ICD-10-CM

## 2015-12-25 DIAGNOSIS — M25529 Pain in unspecified elbow: Secondary | ICD-10-CM | POA: Diagnosis not present

## 2015-12-25 DIAGNOSIS — E785 Hyperlipidemia, unspecified: Secondary | ICD-10-CM | POA: Diagnosis not present

## 2015-12-25 DIAGNOSIS — Z87891 Personal history of nicotine dependence: Secondary | ICD-10-CM | POA: Insufficient documentation

## 2015-12-25 DIAGNOSIS — F411 Generalized anxiety disorder: Secondary | ICD-10-CM

## 2015-12-25 DIAGNOSIS — J449 Chronic obstructive pulmonary disease, unspecified: Secondary | ICD-10-CM | POA: Insufficient documentation

## 2015-12-25 DIAGNOSIS — J42 Unspecified chronic bronchitis: Secondary | ICD-10-CM

## 2015-12-25 MED ORDER — DOXYCYCLINE HYCLATE 100 MG PO TABS: 100.0000 mg | ORAL_TABLET | Freq: Two times a day (BID) | ORAL | 0 refills | Status: AC

## 2015-12-25 NOTE — Progress Notes (Signed)
Pre visit review using our clinic review tool, if applicable. No additional management support is needed unless otherwise documented below in the visit note. 

## 2015-12-25 NOTE — Assessment & Plan Note (Signed)
stable overall by history and exam, recent data reviewed with pt, and pt to continue medical treatment as before,  to f/u any worsening symptoms or concerns @LASTSAO2(3)@  

## 2015-12-25 NOTE — Patient Instructions (Signed)
OK to see Dr Tamala Julian today for the left elbow  Please continue all other medications as before, and refills have been done if requested.  Please have the pharmacy call with any other refills you may need.  Please keep your appointments with your specialists as you may have planned

## 2015-12-25 NOTE — Assessment & Plan Note (Signed)
stable overall by history and exam, recent data reviewed with pt, and pt to continue medical treatment as before,  to f/u any worsening symptoms or concerns Lab Results  Component Value Date   WBC 8.5 12/09/2015   HGB 12.4 12/09/2015   HCT 36.2 12/09/2015   PLT 238 12/09/2015   GLUCOSE 102 12/09/2015   CHOL 202 (H) 07/05/2015   TRIG 313.0 (H) 07/05/2015   HDL 49.60 07/05/2015   LDLDIRECT 109.0 07/05/2015   LDLCALC 84 07/04/2009   ALT 13 12/09/2015   AST 20 12/09/2015   NA 140 12/09/2015   K 4.2 12/09/2015   CL 104 07/05/2015   CREATININE 0.8 12/09/2015   BUN 16.7 12/09/2015   CO2 25 12/09/2015   TSH 1.13 07/05/2015

## 2015-12-25 NOTE — Progress Notes (Signed)
Subjective:    Patient ID: Stacey [name], female    DOB: 07-31-1940, 75 y.o.   MRN: [id number]  HPI   Here to f/u with co left arm swelling, has been ongoing at least 2 wks, without trauma, redness, pain but still with marked swelling that seemed to start at the post distal arm/elbow, with some swelling then moving down the arm.  Has hx of breast ca, no hx of DVT. No fever, Pt denies chest pain, increased sob or doe, wheezing, orthopnea, PND, increased LE swelling, palpitations, dizziness or syncope.  Has been seen per cardiology who did not feel DVT needed ruled out per pt.  Pt denies new neurological symptoms such as new headache, or facial or extremity weakness or numbness   Pt denies polydipsia, polyuria Wt Readings from Last 3 Encounters:  12/25/15 158 lb (71.7 kg)  12/19/15 158 lb 12.8 oz (72 kg)  12/18/15 160 lb 3.2 oz (72.7 kg)  wt has been 144 in 2012, laments her wt gain, states she thinks from tamoxifen. Also still fatigued, but hoping to be doing better in a few months off the med.  Had last visit with oncology and now stopped tamoxifen.  Denies worsening depressive symptoms, suicidal ideation, or panic Past Medical History:  Diagnosis Date  . ANEMIA-NOS   . ANXIETY   . Breast cancer ([place]) 1997 L, 2012 R   s/p chemo/xrt  . COPD    resolved  . DIVERTICULOSIS, COLON 2008  . Dizziness   . GERD   . Hx of radiation therapy 10/19/11 -12/03/11   right breast  . HYPERLIPIDEMIA   . IRRITABLE BOWEL SYNDROME, HX OF   . Left-sided carotid artery disease (HCC)    moderate left ICA stenosis  . OSTEOARTHRITIS, HAND   . PSVT (paroxysmal supraventricular tachycardia) ([facility])    symptomatic on event monitor   Past Surgical History:  Procedure Laterality Date  . ABDOMINAL HYSTERECTOMY    . APPENDECTOMY    . BREAST BIOPSY  11/14/10    r breast: inv, insitu mammary carcinoma w/calcif, er/pr +, her2 -  . BREAST SURGERY     lumpectomy  . CATARACT EXTRACTION     both eyes  .  ELECTROPHYSIOLOGIC STUDY N/A 05/10/2015   Procedure: SVT Ablation;  Surgeon: Will Meredith Leeds, MD;  Location: [facility];  Service: Cardiovascular;  Laterality: N/A;  . HERNIA REPAIR    . inguinal herniorrhapy  1984   left  . rectal fissure repair    . s/p benign breast biopsy  2003   right  . s/p left foot surgury  2009  . 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 spiral fx left foot  2008   no surgury  . TMJ ARTHROPLASTY  1989  . [place]- to remove scar tissue growth   . TONSILLECTOMY      reports that she has been smoking Cigarettes.  She has a 10.00 pack-year smoking history. She has never used smokeless tobacco. She reports that she drinks alcohol. She reports that she does not use drugs. family history includes Alcohol abuse in her mother; Brain cancer in her cousin; Breast cancer in her cousin, cousin, and cousin; Colon polyps in her mother; Diabetes in her mother; Hypertension in her mother; Lung cancer in her maternal grandfather and paternal uncle; Lung cancer (age of onset: 19) in her maternal grandmother; Lymphoma in her brother; Pancreatic cancer (age of onset: 30)  in her mother; Stroke in her mother; Testicular cancer in her cousin; Uterine cancer (age of onset: 80) in her mother. Allergies  Allergen Reactions  . Bee Venom Swelling  . Clindamycin/Lincomycin Diarrhea and Nausea And Vomiting  . Codeine Nausea And Vomiting   Current Outpatient Prescriptions on File Prior to Visit  Medication Sig Dispense Refill  . aspirin 81 MG EC tablet Take 81 mg by mouth daily.      . calcium-vitamin D (OSCAL WITH D) 500-200 MG-UNIT per tablet Take 1 tablet by mouth daily.    . cholecalciferol (VITAMIN D) 1000 UNITS tablet Take 1,000 Units by mouth daily.      . clotrimazole-betamethasone (LOTRISONE) cream Apply 1 application topically 2 (two) times daily as needed.    . diphenhydrAMINE (BENADRYL) 25 MG tablet Take 25 mg by mouth every 6 (six) hours as needed  (for bee stings).     . fluticasone (FLONASE) 50 MCG/ACT nasal spray Place 2 sprays into both nostrils daily.    Marland Kitchen loratadine (CLARITIN) 10 MG tablet Take 10 mg by mouth daily.    . meloxicam (MOBIC) 15 MG tablet Take 15 mg by mouth daily.    . Multiple Vitamin (MULTIVITAMIN) capsule Take 1 capsule by mouth daily.      . naproxen sodium (ANAPROX) 220 MG tablet Take 440 mg by mouth daily as needed (for arthritis pain).    Marland Kitchen omeprazole (PRILOSEC) 20 MG capsule take 1 capsule by mouth once daily 90 capsule 1  . polyethylene glycol (MIRALAX / GLYCOLAX) packet Take 17 g by mouth daily as needed (constipation). 30 each 11  . pseudoephedrine (SUDAFED) 30 MG tablet Take 30 mg by mouth every 4 (four) hours as needed for congestion.     Marland Kitchen zolpidem (AMBIEN) 10 MG tablet take 1 tablet by mouth at bedtime if needed 90 tablet 1   No current facility-administered medications on file prior to visit.    Review of Systems  Constitutional: Negative for unusual diaphoresis or night sweats HENT: Negative for ear swelling or discharge Eyes: Negative for worsening visual haziness  Respiratory: Negative for choking and stridor.   Gastrointestinal: Negative for distension or worsening eructation Genitourinary: Negative for retention or change in urine volume.  Musculoskeletal: Negative for other MSK pain or swelling Skin: Negative for color change and worsening wound Neurological: Negative for tremors and numbness other than noted  Psychiatric/Behavioral: Negative for decreased concentration or agitation other than above   All other system neg per pt    Objective:   Physical Exam BP 138/78   Pulse 72   Temp 98.4 F (36.9 C) (Oral)   Resp 20   Wt 158 lb (71.7 kg)   SpO2 98%   BMI 26.70 kg/m  VS noted,  Constitutional: Pt appears in no apparent distress HENT: Head: NCAT.  Right Ear: External ear normal.  Left Ear: External ear normal.  Eyes: . Pupils are equal, round, and reactive to light.  Conjunctivae and EOM are normal Neck: Normal range of motion. Neck supple.  Cardiovascular: Normal rate and regular rhythm.   Pulmonary/Chest: Effort normal and breath sounds decreased without rales or wheezing.  LUE with unusual large almost brawny edema to post left distal arm possibly involving the elbow?, NT, no fluctuance or drainage, no skin rash or erythema or ulcer, with LUE o/w neurovasc intact Neurological: Pt is alert. Not confused , motor grossly intact Skin: Skin is warm. No rash, no LE edema Psychiatric: Pt behavior is normal. No agitation. mild  nervous, not depressed affect    Assessment & Plan:

## 2015-12-25 NOTE — Assessment & Plan Note (Signed)
Unsusual, etiology unclear, doubt DVT as well, suspect possible bursitis but not clear, will refer to Dr Smith/sport med for u/s evaluation and tx

## 2015-12-25 NOTE — Progress Notes (Signed)
Venous duplex negative for DVT. 

## 2015-12-25 NOTE — Patient Instructions (Addendum)
Good to see you  To be safe we will get a doppler of the left arm to make sure there is no clot.  Increase your baby aspirin to 2 pills daily for now .  I will get you on an antibiotic as well. Doxycycline 2 times daily for 10 days.  I would like to see you again in 10-14 days.  Happy holidays!

## 2015-12-25 NOTE — Progress Notes (Signed)
[name] [address], [place] [address] Phone: [phone] Subjective:    I'm seeing this patient by the request  of:  Cathlean Cower, MD   CC: Left arm swelling  CNO:[id number]  Stacey [name] is a 75 y.o. female coming in with complaint of left arm swelling. Patient is having left arm swelling previously. Patient's past medical history significant for breast cancer bilaterally as well as recent workup for supraventricular tachycardia that spontaneously resolved. Patient is also a smoker. Patient states that she has had some swelling in this left on for some time. Started to be significant he swollen all way down to the hand and did have some redness initially. No significant change and cannot remember any true injury. States that over the course of time the swelling has improved. Recently did have also dental surgery where patient was on antibiotic and that could've held this improves. Patient was seen by her oncologist recently in stopped tamoxifen but was having swelling before this. Patient did discuss the swelling with her oncologist on November 16. Seem to be more of an intermittent problem lidocaine and go. There was no concern for a clot at the time the patient states and still has not completely resolved. Patient was offered imaging at that time which patient declined and went to go see primary care provider. Primary care provider sent here for further evaluation. Denies any numbness or weakness. Patient would just like a potential diagnosis.      Past Medical History:  Diagnosis Date  . ANEMIA-NOS   . ANXIETY   . Breast cancer ([place]) 1997 L, 2012 R   s/p chemo/xrt  . COPD    resolved  . DIVERTICULOSIS, COLON 2008  . Dizziness   . GERD   . Hx of radiation therapy 10/19/11 -12/03/11   right breast  . HYPERLIPIDEMIA   . IRRITABLE BOWEL SYNDROME, HX OF   . Left-sided carotid artery disease (HCC)    moderate left ICA stenosis  .  OSTEOARTHRITIS, HAND   . PSVT (paroxysmal supraventricular tachycardia) (HCC)    symptomatic on event monitor   Past Surgical History:  Procedure Laterality Date  . ABDOMINAL HYSTERECTOMY    . APPENDECTOMY    . BREAST BIOPSY  11/14/10    r breast: inv, insitu mammary carcinoma w/calcif, er/pr +, her2 -  . BREAST SURGERY     lumpectomy  . CATARACT EXTRACTION     both eyes  . ELECTROPHYSIOLOGIC STUDY N/A 05/10/2015   Procedure: SVT Ablation;  Surgeon: Will Meredith Leeds, MD;  Location: [facility];  Service: Cardiovascular;  Laterality: N/A;  . HERNIA REPAIR    . inguinal herniorrhapy  1984   left  . rectal fissure repair    . s/p benign breast biopsy  2003   right  . s/p left foot surgury  2009  . 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 spiral fx left foot  2008   no surgury  . TMJ ARTHROPLASTY  1989  . [place]- to remove scar tissue growth   . TONSILLECTOMY     Social History   Social History  . Marital status: Divorced    Spouse name: N/A  . Number of children: 0  . Years of education: N/A   Occupational History  . retired Banker    Social History Main Topics  . Smoking status: Current Some Day Smoker  Packs/day: 0.25    Years: 40.00    Types: Cigarettes  . Smokeless tobacco: Never Used  . Alcohol use Yes     Comment: rare/ drinks socially  . Drug use: No  . Sexual activity: Not Asked   Other Topics Concern  . None   Social History Narrative   Patient gets no regular exercise   No biological children   2 step children   Allergies  Allergen Reactions  . Bee Venom Swelling  . Clindamycin/Lincomycin Diarrhea and Nausea And Vomiting  . Codeine Nausea And Vomiting   Family History  Problem Relation Age of Onset  . Alcohol abuse Mother     ETOH dependence  . Hypertension Mother   . Stroke Mother   . Colon polyps Mother   . Diabetes Mother   . Pancreatic cancer Mother 26  . Uterine cancer Mother 41  . Lymphoma  Brother     burkitts  . Lung cancer Paternal Uncle   . Lung cancer Maternal Grandmother 23    non-smoker  . Lung cancer Maternal Grandfather   . Breast cancer Cousin     maternal cousin, dx in her mid 47s  . Brain cancer Cousin     maternal cousin's son; dx in his 44s  . Testicular cancer Cousin     maternal cousin's son;   . Breast cancer Cousin     paternal cousin; dx in her 49s  . Breast cancer Cousin     1 maternal, 2 paternal  . Colon cancer Neg Hx   . Esophageal cancer Neg Hx   . Stomach cancer Neg Hx   . Rectal cancer Neg Hx     Past medical history, social, surgical and family history all reviewed in electronic medical record.  No pertanent information unless stated regarding to the chief complaint.   Review of Systems:Review of systems updated and as accurate as of 12/25/15  No headache, visual changes, nausea, vomiting, diarrhea, constipation, dizziness, abdominal pain, skin rash, fevers, chills, night sweats, weight loss, swollen lymph nodes, body aches, joint swelling, muscle aches, chest pain, shortness of breath, mood changes.   Objective  Blood pressure 138/78, pulse 72, height 5' 4.5" (1.638 m), weight 158 lb (71.7 kg), SpO2 98 %. Systems examined below as of 12/25/15   General: No apparent distress alert and oriented x3 mood and affect normal, dressed appropriately.  HEENT: Pupils equal, extraocular movements intact  Respiratory: Patient's speak in full sentences and does not appear short of breath  Cardiovascular: No lower extremity edema, non tender, no erythema  Skin: Warm dry intact with no signs of infection or rash on extremities or on axial skeleton.  Abdomen: Soft nontender  Neuro: Cranial nerves II through XII are intact, neurovascularly intact in all extremities with 2+ DTRs and 2+ pulses.  Lymph: No lymphadenopathy of posterior or anterior cervical chain or axillae bilaterally.  Gait normal with good balance and coordination.  MSK:  Non tender with  full range of motion and good stability and symmetric strength and tone of shoulders, hip, knee and ankles bilaterally.  Patient's left arm does show that patient does have more of a lymphedema with 2+ pitting edema of the upper extremity from approximately 3 cm above the elbow all the way down to the wrist. Patient has full range of motion of the wrist. Mild numbness with compression of the elbow in full flexion. Good grip strength. Neurovascularly intact distally. Good capillary refill. Negative worsening symptoms with compression of the  thoracic outlet. Mild limitation in neck range of motion but negative Spurling's.  Limited muscular skeletal ultrasound was performed and interpreted by Lyndal Pulley Limited ultrasound the patient's left upper extremity shows the patient does have soft tissue swelling as well as some mild myositis with increasing Doppler flow. It appears that most of the veins are compressible except for one in the forearm. This is nontender on exam though. Impression: Lymphedema with mild myositis and a noncompressible vein of the forearm potential.   Impression and Recommendations:     This case required medical decision making of moderate complexity.      Note: This dictation was prepared with Dragon dictation along with smaller phrase technology. Any transcriptional errors that result from this process are unintentional.

## 2015-12-25 NOTE — Assessment & Plan Note (Addendum)
Most likely diagnosis is from lymphedema. We discussed with patient at great length. I do feel with patient's past medical history significant for medication changes, history of breast cancer, and a smoker that I Doppler to rule out any type of upper extremities is necessary. Patient encouraged to do this. Patient had that scheduled and will have it done today. In addition of this patient was treated with antibiotics recently that seemed to improve some of the swelling and we will treat her for a potential resolving cellulitis for next 10 days with further coverage for MRSA. I do not see any significant muscle or bony abnormality that could be contributing and no significant bursitis. Discussed with patient about the potential for compression if needed if this will be a more of a lymphedema. Patient will follow-up with me again in 10-14 to make sure patient is responding as well. Patient is also had a history of radiation in this could be more secondary to scarring in the vicinity causing more of a thoracic outlet. We will discuss further and discuss further workup if necessary.

## 2016-01-07 ENCOUNTER — Other Ambulatory Visit: Payer: Self-pay | Admitting: Internal Medicine

## 2016-01-13 NOTE — Progress Notes (Signed)
Stacey Stacey [name], [name] Phone: [phone] Subjective:    I'm seeing this patient by the request  of:  Stacey Cower, MD   CC: Left arm swelling f/u  RVU:[id number]  Stacey [name] is a 75 y.o. female coming in with complaint of left arm swelling. Patient is having left arm swelling previously. Patient's past medical history significant for breast cancer bilaterally as well as recent workup for supraventricular tachycardia that spontaneously resolved. Patient is also a smoker.  Patient was seen by me and did have a Doppler done to rule out any upper extremity thrombosis. This was negative. In office ultrasound did show the patient had more of a lymphedema as well as some mild myositis. Asian states that she continues to have the swelling. Things and is worsening. What else to potentially do. Patient's past medical history significant for breast cancer and did have removal of lymph nodes previously but this was greater than 25 years ago.      Past Medical History:  Diagnosis Date  . ANEMIA-NOS   . ANXIETY   . Breast cancer ([place]) 1997 L, 2012 R   s/p chemo/xrt  . COPD    resolved  . DIVERTICULOSIS, COLON 2008  . Dizziness   . GERD   . Hx of radiation therapy 10/19/11 -12/03/11   right breast  . HYPERLIPIDEMIA   . IRRITABLE BOWEL SYNDROME, HX OF   . Left-sided carotid artery disease (HCC)    moderate left ICA stenosis  . OSTEOARTHRITIS, HAND   . PSVT (paroxysmal supraventricular tachycardia) (HCC)    symptomatic on event monitor   Past Surgical History:  Procedure Laterality Date  . ABDOMINAL HYSTERECTOMY    . APPENDECTOMY    . BREAST BIOPSY  11/14/10    r breast: inv, insitu mammary carcinoma w/calcif, er/pr +, her2 -  . BREAST SURGERY     lumpectomy  . CATARACT EXTRACTION     both eyes  . ELECTROPHYSIOLOGIC STUDY N/A 05/10/2015   Procedure: SVT Ablation;  Surgeon: Will Meredith Leeds, MD;  Location: [facility];   Service: Cardiovascular;  Laterality: N/A;  . HERNIA REPAIR    . inguinal herniorrhapy  1984   left  . rectal fissure repair    . s/p benign breast biopsy  2003   right  . s/p left foot surgury  2009  . 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 spiral fx left foot  2008   no surgury  . TMJ ARTHROPLASTY  1989  . [place]- to remove scar tissue growth   . TONSILLECTOMY     Social History   Social History  . Marital status: Divorced    Spouse name: N/A  . Number of children: 0  . Years of education: N/A   Occupational History  . retired Banker    Social History Main Topics  . Smoking status: Current Some Day Smoker    Packs/day: 0.25    Years: 40.00    Types: Cigarettes  . Smokeless tobacco: Never Used  . Alcohol use Yes     Comment: rare/ drinks socially  . Drug use: No  . Sexual activity: Not Asked   Other Topics Concern  . None   Social History Narrative   Patient gets no regular exercise   No biological children   2 step children   Allergies  Allergen Reactions  . Bee  Venom Swelling  . Clindamycin/Lincomycin Diarrhea and Nausea And Vomiting  . Codeine Nausea And Vomiting   Family History  Problem Relation Age of Onset  . Alcohol abuse Mother     ETOH dependence  . Hypertension Mother   . Stroke Mother   . Colon polyps Mother   . Diabetes Mother   . Pancreatic cancer Mother 17  . Uterine cancer Mother 64  . Lymphoma Brother     burkitts  . Lung cancer Paternal Uncle   . Lung cancer Maternal Grandmother 67    non-smoker  . Lung cancer Maternal Grandfather   . Breast cancer Cousin     maternal cousin, dx in her mid 34s  . Brain cancer Cousin     maternal cousin's son; dx in his 96s  . Testicular cancer Cousin     maternal cousin's son;   . Breast cancer Cousin     paternal cousin; dx in her 54s  . Breast cancer Cousin     1 maternal, 2 paternal  . Colon cancer Neg Hx   . Esophageal cancer Neg Hx   . Stomach cancer Neg  Hx   . Rectal cancer Neg Hx     Past medical history, social, surgical and family history all reviewed in electronic medical record.  No pertanent information unless stated regarding to the chief complaint.   Review of Systems:Review of systems updated and as accurate as of 01/14/16  No headache, visual changes, nausea, vomiting, diarrhea, constipation, dizziness, abdominal pain, skin rash, fevers, chills, night sweats, weight loss, swollen lymph nodes, body aches, joint swelling, muscle aches, chest pain, shortness of breath, mood changes.   Objective  Blood pressure 132/72, pulse 98, height _0  (1.626 m), weight 160 lb (72.6 kg), SpO2 98 %.   Systems examined below as of 01/14/16 General: NAD A&O x3 mood, affect normal  HEENT: Pupils equal, extraocular movements intact no nystagmus Respiratory: not short of breath at rest or with speaking Cardiovascular: No lower extremity edema, non tender Skin: Warm dry intact with no signs of infection or rash on extremities or on axial skeleton. Abdomen: Soft nontender, no masses Neuro: Cranial nerves  intact, neurovascularly intact in all extremities with 2+ DTRs and 2+ pulses. Lymph: No lymphadenopathy appreciated today  Gait normal with good balance and coordination.  .  MSK:  Non tender with full range of motion and good stability and symmetric strength and tone of shoulders, hip, knee and ankles bilaterally.  Patient's left arm does show that patient does have more of a lymphedema with 2+ pitting edema of the upper extremity from approximately 2 cm above the elbow and does have more of the humerus than truly at the wrist this time. Seems to be minorly different distribution than previously. Neurovascular intact distally.    Impression and Recommendations:     This case required medical decision making of moderate complexity.      Note: This dictation was prepared with Dragon dictation along with smaller phrase technology. Any  transcriptional errors that result from this process are unintentional.

## 2016-01-14 ENCOUNTER — Ambulatory Visit (INDEPENDENT_AMBULATORY_CARE_PROVIDER_SITE_OTHER): Payer: Medicare Other | Admitting: Family Medicine

## 2016-01-14 ENCOUNTER — Encounter: Payer: Self-pay | Admitting: Family Medicine

## 2016-01-14 VITALS — BP 132/72 | HR 98 | Ht 64.0 in | Wt 160.0 lb

## 2016-01-14 DIAGNOSIS — I890 Lymphedema, not elsewhere classified: Secondary | ICD-10-CM | POA: Insufficient documentation

## 2016-01-14 DIAGNOSIS — Z853 Personal history of malignant neoplasm of breast: Secondary | ICD-10-CM

## 2016-01-14 DIAGNOSIS — M7989 Other specified soft tissue disorders: Secondary | ICD-10-CM

## 2016-01-14 DIAGNOSIS — Z86000 Personal history of in-situ neoplasm of breast: Secondary | ICD-10-CM

## 2016-01-14 NOTE — Assessment & Plan Note (Signed)
Discussed with patient at great length. This was greater than 25 years ago and did need unfortunate laying chemotherapy and radiation and did have lymph nodes removed. I do believe the patient likely has lymphedema secondary to sclerotic lymph system at this point. Patient is concern for any type of recurrence to the cancer. With patient being significant concern, failing all conservative therapy, and no other findings for diagnosis at this time I do feel advance imaging is warranted. MRI of the shoulder and humerus ordered today. Patient will be put in a more of a thoracic outlet syndrome type position to see if there is any type of compression that could be contribute in. Depending on findings we will discuss further treatment options thereafter. All patient's questions were answered today.  Spent  25 minutes with patient face-to-face and had greater than 50% of counseling including as described above in assessment and plan.

## 2016-01-14 NOTE — Patient Instructions (Addendum)
Good to see you  We will get the MRI [facility] [employer] and try an arm compression.  I will call you with the results Happy holidays!

## 2016-01-21 ENCOUNTER — Other Ambulatory Visit: Payer: Self-pay | Admitting: Cardiovascular Disease

## 2016-01-21 DIAGNOSIS — I6523 Occlusion and stenosis of bilateral carotid arteries: Secondary | ICD-10-CM

## 2016-01-24 ENCOUNTER — Other Ambulatory Visit: Payer: Self-pay | Admitting: Internal Medicine

## 2016-01-24 NOTE — Telephone Encounter (Signed)
Done hardcopy to Corinne  

## 2016-01-24 NOTE — Telephone Encounter (Signed)
faxed

## 2016-01-25 ENCOUNTER — Ambulatory Visit
Admission: RE | Admit: 2016-01-25 | Discharge: 2016-01-25 | Disposition: A | Discharge status: Home / Self Care | Payer: Medicare Other | Source: Ambulatory Visit | Attending: Family Medicine | Admitting: Family Medicine

## 2016-01-25 DIAGNOSIS — Z853 Personal history of malignant neoplasm of breast: Secondary | ICD-10-CM

## 2016-01-25 DIAGNOSIS — I890 Lymphedema, not elsewhere classified: Secondary | ICD-10-CM

## 2016-01-25 DIAGNOSIS — R600 Localized edema: Secondary | ICD-10-CM | POA: Diagnosis not present

## 2016-01-25 DIAGNOSIS — M25512 Pain in left shoulder: Secondary | ICD-10-CM | POA: Diagnosis not present

## 2016-01-28 ENCOUNTER — Other Ambulatory Visit: Payer: Self-pay | Admitting: *Deleted

## 2016-01-28 DIAGNOSIS — Z853 Personal history of malignant neoplasm of breast: Secondary | ICD-10-CM

## 2016-02-05 ENCOUNTER — Ambulatory Visit (HOSPITAL_COMMUNITY)
Admission: RE | Admit: 2016-02-05 | Discharge: 2016-02-05 | Disposition: A | Discharge status: Home / Self Care | Payer: Medicare Other | Source: Ambulatory Visit | Attending: Cardiology | Admitting: Cardiology

## 2016-02-05 DIAGNOSIS — I6523 Occlusion and stenosis of bilateral carotid arteries: Secondary | ICD-10-CM | POA: Insufficient documentation

## 2016-02-05 DIAGNOSIS — J449 Chronic obstructive pulmonary disease, unspecified: Secondary | ICD-10-CM | POA: Diagnosis not present

## 2016-02-05 DIAGNOSIS — E785 Hyperlipidemia, unspecified: Secondary | ICD-10-CM | POA: Insufficient documentation

## 2016-02-05 DIAGNOSIS — Z87891 Personal history of nicotine dependence: Secondary | ICD-10-CM | POA: Insufficient documentation

## 2016-02-07 ENCOUNTER — Other Ambulatory Visit: Payer: Self-pay | Admitting: Cardiovascular Disease

## 2016-02-07 DIAGNOSIS — I739 Peripheral vascular disease, unspecified: Principal | ICD-10-CM

## 2016-02-07 DIAGNOSIS — I779 Disorder of arteries and arterioles, unspecified: Secondary | ICD-10-CM

## 2016-02-10 ENCOUNTER — Encounter (HOSPITAL_COMMUNITY)
Admission: RE | Admit: 2016-02-10 | Discharge: 2016-02-10 | Disposition: A | Discharge status: Home / Self Care | Payer: Medicare Other | Source: Ambulatory Visit | Attending: Family Medicine | Admitting: Family Medicine

## 2016-02-10 DIAGNOSIS — Z853 Personal history of malignant neoplasm of breast: Secondary | ICD-10-CM | POA: Diagnosis not present

## 2016-02-10 MED ORDER — TECHNETIUM TC 99M MEDRONATE IV KIT
19.7000 | PACK | Freq: Once | INTRAVENOUS | Status: AC | PRN
  Administered 2016-02-10: 19.7 via INTRAVENOUS

## 2016-03-11 ENCOUNTER — Other Ambulatory Visit: Payer: Self-pay | Admitting: Internal Medicine

## 2016-05-14 ENCOUNTER — Encounter: Payer: Self-pay | Admitting: Internal Medicine

## 2016-05-14 ENCOUNTER — Ambulatory Visit (INDEPENDENT_AMBULATORY_CARE_PROVIDER_SITE_OTHER)
Admission: RE | Admit: 2016-05-14 | Discharge: 2016-05-14 | Disposition: A | Discharge status: Home / Self Care | Payer: Medicare Other | Source: Ambulatory Visit | Attending: Internal Medicine | Admitting: Internal Medicine

## 2016-05-14 ENCOUNTER — Other Ambulatory Visit (INDEPENDENT_AMBULATORY_CARE_PROVIDER_SITE_OTHER): Payer: Medicare Other

## 2016-05-14 ENCOUNTER — Ambulatory Visit (INDEPENDENT_AMBULATORY_CARE_PROVIDER_SITE_OTHER): Payer: Medicare Other | Admitting: Internal Medicine

## 2016-05-14 VITALS — BP 134/88 | HR 100 | Temp 98.2°F | Ht 64.0 in | Wt 163.0 lb

## 2016-05-14 DIAGNOSIS — E785 Hyperlipidemia, unspecified: Secondary | ICD-10-CM

## 2016-05-14 DIAGNOSIS — I6523 Occlusion and stenosis of bilateral carotid arteries: Secondary | ICD-10-CM | POA: Diagnosis not present

## 2016-05-14 DIAGNOSIS — R0609 Other forms of dyspnea: Secondary | ICD-10-CM | POA: Insufficient documentation

## 2016-05-14 DIAGNOSIS — R7989 Other specified abnormal findings of blood chemistry: Secondary | ICD-10-CM | POA: Diagnosis not present

## 2016-05-14 DIAGNOSIS — J42 Unspecified chronic bronchitis: Secondary | ICD-10-CM | POA: Diagnosis not present

## 2016-05-14 DIAGNOSIS — J449 Chronic obstructive pulmonary disease, unspecified: Secondary | ICD-10-CM | POA: Diagnosis not present

## 2016-05-14 DIAGNOSIS — R0600 Dyspnea, unspecified: Secondary | ICD-10-CM

## 2016-05-14 LAB — BASIC METABOLIC PANEL
BUN: 17 mg/dL (ref 6–23)
CALCIUM: 9.6 mg/dL (ref 8.4–10.5)
CO2: 29 meq/L (ref 19–32)
Chloride: 107 mEq/L (ref 96–112)
Creatinine, Ser: 0.88 mg/dL (ref 0.40–1.20)
GFR: 66.37 mL/min (ref >60.00)
Glucose, Bld: 109 mg/dL — ABNORMAL HIGH (ref 70–99)
Potassium: 3.6 mEq/L (ref 3.5–5.1)
SODIUM: 144 meq/L (ref 135–145)

## 2016-05-14 LAB — LIPID PANEL
Cholesterol: 199 mg/dL (ref 0–200)
HDL: 49.9 mg/dL (ref >39.00)
NONHDL: 149.22
Total CHOL/HDL Ratio: 4
Triglycerides: 229 mg/dL — ABNORMAL HIGH (ref 0.0–149.0)
VLDL: 45.8 mg/dL — ABNORMAL HIGH (ref 0.0–40.0)

## 2016-05-14 LAB — CBC WITH DIFFERENTIAL/PLATELET
BASOS ABS: 0.1 10*3/uL (ref 0.0–0.1)
Basophils Relative: 0.7 % (ref 0.0–3.0)
EOS PCT: 2.6 % (ref 0.0–5.0)
Eosinophils Absolute: 0.2 10*3/uL (ref 0.0–0.7)
HCT: 34.7 % — ABNORMAL LOW (ref 36.0–46.0)
Hemoglobin: 11.9 g/dL — ABNORMAL LOW (ref 12.0–15.0)
LYMPHS ABS: 2.2 10*3/uL (ref 0.7–4.0)
Lymphocytes Relative: 25.7 % (ref 12.0–46.0)
MCHC: 34.2 g/dL (ref 30.0–36.0)
MCV: 87.3 fl (ref 78.0–100.0)
Monocytes Absolute: 0.9 10*3/uL (ref 0.1–1.0)
Monocytes Relative: 10.3 % (ref 3.0–12.0)
NEUTROS ABS: 5.2 10*3/uL (ref 1.4–7.7)
NEUTROS PCT: 60.7 % (ref 43.0–77.0)
PLATELETS: 286 10*3/uL (ref 150.0–400.0)
RBC: 3.97 Mil/uL (ref 3.87–5.11)
RDW: 13.7 % (ref 11.5–15.5)
WBC: 8.5 10*3/uL (ref 4.0–10.5)

## 2016-05-14 LAB — URINALYSIS, ROUTINE W REFLEX MICROSCOPIC
BILIRUBIN URINE: NEGATIVE
HGB URINE DIPSTICK: NEGATIVE
Ketones, ur: NEGATIVE
Nitrite: NEGATIVE
Specific Gravity, Urine: 1.025 (ref 1.000–1.030)
TOTAL PROTEIN, URINE-UPE24: NEGATIVE
URINE GLUCOSE: NEGATIVE
Urobilinogen, UA: 0.2 (ref 0.0–1.0)
pH: 6 (ref 5.0–8.0)

## 2016-05-14 LAB — HEPATIC FUNCTION PANEL
ALK PHOS: 60 U/L (ref 39–117)
ALT: 20 U/L (ref 0–35)
AST: 29 U/L (ref 0–37)
Albumin: 4 g/dL (ref 3.5–5.2)
BILIRUBIN DIRECT: 0.1 mg/dL (ref 0.0–0.3)
TOTAL PROTEIN: 6.9 g/dL (ref 6.0–8.3)
Total Bilirubin: 0.4 mg/dL (ref 0.2–1.2)

## 2016-05-14 LAB — LDL CHOLESTEROL, DIRECT: Direct LDL: 124 mg/dL

## 2016-05-14 LAB — TSH: TSH: 1.89 u[IU]/mL (ref 0.35–4.50)

## 2016-05-14 MED ORDER — TIOTROPIUM BROMIDE MONOHYDRATE 18 MCG IN CAPS: 18.0000 ug | ORAL_CAPSULE | Freq: Every day | RESPIRATORY_TRACT | 12 refills | Status: DC

## 2016-05-14 MED ORDER — ALBUTEROL SULFATE HFA 108 (90 BASE) MCG/ACT IN AERS: 2.0000 | INHALATION_SPRAY | Freq: Four times a day (QID) | RESPIRATORY_TRACT | 11 refills | Status: DC | PRN

## 2016-05-14 MED ORDER — DICLOFENAC SODIUM 1 % TD GEL: 4.0000 g | Freq: Four times a day (QID) | TRANSDERMAL | 11 refills | Status: DC | PRN

## 2016-05-14 NOTE — Assessment & Plan Note (Addendum)
Ok for trial spiriva and albuterol MDi prn,  to f/u any worsening symptoms or concerns, also for PFT's

## 2016-05-14 NOTE — Progress Notes (Signed)
Subjective:    Patient ID: Stacey [name], female    DOB: 1940/04/14, 76 y.o.   MRN: [id number]  HPI  Here to f/u with c/o sob/doe that she recalls seemed to start soon after XRT for breast ca about oct 2012.  Had some radiation fatigue soon after.  Last cxr 2013 - neg for acute.  Had some sense of breathlessness at times then.  In her prime she could walk 15 miles in one day, now down recently to 6 miles, and much more difficult to get up the same hill.  Quit smoking last fall, gained about 15 lbs.  No fever, and no significant cough since quit smoking.  No hx of prior wheezing, but more recently started wheezing intermittent about 4-5 mo.  No hx of copd, and can "do anything I want to do" but gets obviously tired more quickly especially with any incline.  Never taken inhalers. No hx of chf, PE, or pna.   Past Medical History:  Diagnosis Date  . ANEMIA-NOS   . ANXIETY   . Breast cancer ([place]) 1997 L, 2012 R   s/p chemo/xrt  . COPD    resolved  . DIVERTICULOSIS, COLON 2008  . Dizziness   . GERD   . Hx of radiation therapy 10/19/11 -12/03/11   right breast  . HYPERLIPIDEMIA   . IRRITABLE BOWEL SYNDROME, HX OF   . Left-sided carotid artery disease (HCC)    moderate left ICA stenosis  . OSTEOARTHRITIS, HAND   . PSVT (paroxysmal supraventricular tachycardia) (HCC)    symptomatic on event monitor   Past Surgical History:  Procedure Laterality Date  . ABDOMINAL HYSTERECTOMY    . APPENDECTOMY    . BREAST BIOPSY  11/14/10    r breast: inv, insitu mammary carcinoma w/calcif, er/pr +, her2 -  . BREAST SURGERY     lumpectomy  . CATARACT EXTRACTION     both eyes  . ELECTROPHYSIOLOGIC STUDY N/A 05/10/2015   Procedure: SVT Ablation;  Surgeon: Will Meredith Leeds, MD;  Location: [facility];  Service: Cardiovascular;  Laterality: N/A;  . HERNIA REPAIR    . inguinal herniorrhapy  1984   left  . rectal fissure repair    . s/p benign breast biopsy  2003   right  . s/p left foot surgury   2009  . 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 spiral fx left foot  2008   no surgury  . TMJ ARTHROPLASTY  1989  . [place]- to remove scar tissue growth   . TONSILLECTOMY      reports that she has been smoking Cigarettes.  She has a 10.00 pack-year smoking history. She has never used smokeless tobacco. She reports that she drinks alcohol. She reports that she does not use drugs. family history includes Alcohol abuse in her mother; Brain cancer in her cousin; Breast cancer in her cousin, cousin, and cousin; Colon polyps in her mother; Diabetes in her mother; Hypertension in her mother; Lung cancer in her maternal grandfather and paternal uncle; Lung cancer (age of onset: 48) in her maternal grandmother; Lymphoma in her brother; Pancreatic cancer (age of onset: 108) in her mother; Stroke in her mother; Testicular cancer in her cousin; Uterine cancer (age of onset: 29) in her mother. Allergies  Allergen Reactions  . Bee Venom Swelling  . Clindamycin/Lincomycin Diarrhea and Nausea And Vomiting  . Codeine Nausea And Vomiting   Current  Outpatient Prescriptions on File Prior to Visit  Medication Sig Dispense Refill  . aspirin 81 MG EC tablet Take 81 mg by mouth daily.      . calcium-vitamin D (OSCAL WITH D) 500-200 MG-UNIT per tablet Take 1 tablet by mouth daily.    . cholecalciferol (VITAMIN D) 1000 UNITS tablet Take 1,000 Units by mouth daily.      . clotrimazole-betamethasone (LOTRISONE) cream Apply 1 application topically 2 (two) times daily as needed.    . diphenhydrAMINE (BENADRYL) 25 MG tablet Take 25 mg by mouth every 6 (six) hours as needed (for bee stings).     . fluticasone (FLONASE) 50 MCG/ACT nasal spray Place 2 sprays into both nostrils daily.    Marland Kitchen loratadine (CLARITIN) 10 MG tablet Take 10 mg by mouth daily.    . meloxicam (MOBIC) 15 MG tablet take 1 tablet by mouth once daily if needed 90 tablet 3  . Multiple Vitamin (MULTIVITAMIN) capsule Take 1 capsule by  mouth daily.      . naproxen sodium (ANAPROX) 220 MG tablet Take 440 mg by mouth daily as needed (for arthritis pain).    Marland Kitchen omeprazole (PRILOSEC) 20 MG capsule take 1 capsule by mouth once daily 90 capsule 1  . polyethylene glycol (MIRALAX / GLYCOLAX) packet Take 17 g by mouth daily as needed (constipation). 30 each 11  . pseudoephedrine (SUDAFED) 30 MG tablet Take 30 mg by mouth every 4 (four) hours as needed for congestion.     Marland Kitchen zolpidem (AMBIEN) 10 MG tablet take 1 tablet by mouth at bedtime if needed 90 tablet 1   No current facility-administered medications on file prior to visit.    Review of Systems  Constitutional: Negative for other unusual diaphoresis or sweats HENT: Negative for ear discharge or swelling Eyes: Negative for other worsening visual disturbances Respiratory: Negative for stridor or other swelling  Gastrointestinal: Negative for worsening distension or other blood Genitourinary: Negative for retention or other urinary change Musculoskeletal: Negative for other MSK pain or swelling Skin: Negative for color change or other new lesions Neurological: Negative for worsening tremors and other numbness  Psychiatric/Behavioral: Negative for worsening agitation or other fatigue All other system neg per pt    Objective:   Physical Exam BP 134/88   Pulse 100   Temp 98.2 F (36.8 C) (Oral)   Ht 5' 4" (1.626 m)   Wt 173 lb (78.5 kg)   SpO2 98%   BMI 29.70 kg/m  VS noted,  Constitutional: Pt appears in NAD HENT: Head: NCAT.  Right Ear: External ear normal.  Left Ear: External ear normal.  Eyes: . Pupils are equal, round, and reactive to light. Conjunctivae and EOM are normal Nose: without d/c or deformity Neck: Neck supple. Gross normal ROM Cardiovascular: Normal rate and regular rhythm.   Pulmonary/Chest: Effort normal and breath sounds decreased without rales or wheezing.  Abd:  Soft, NT, ND, + BS, no organomegaly Neurological: Pt is alert. At baseline  orientation, motor grossly intact Skin: Skin is warm. No rashes, other new lesions, no LE edema Psychiatric: Pt behavior is normal without agitation  No other exam findings  Lab Results  Component Value Date   WBC 8.5 12/09/2015   HGB 12.4 12/09/2015   HCT 36.2 12/09/2015   PLT 238 12/09/2015   GLUCOSE 102 12/09/2015   CHOL 202 (H) 07/05/2015   TRIG 313.0 (H) 07/05/2015   HDL 49.60 07/05/2015   LDLDIRECT 109.0 07/05/2015   LDLCALC 84 07/04/2009  ALT 13 12/09/2015   AST 20 12/09/2015   NA 140 12/09/2015   K 4.2 12/09/2015   CL 104 07/05/2015   CREATININE 0.8 12/09/2015   BUN 16.7 12/09/2015   CO2 25 12/09/2015   TSH 1.13 07/05/2015       Assessment & Plan:

## 2016-05-14 NOTE — Patient Instructions (Addendum)
Please take all new medication as prescribed - the albuterol (proventil) inhaler, and Spiriva as directed  You will be contacted regarding the referral for: Pulmonary Function tests  Please continue all other medications as before, and refills have been done if requested.  Please have the pharmacy call with any other refills you may need.  Please continue your efforts at being more active, low cholesterol diet, and weight control.  You are otherwise up to date with prevention measures today.  Please keep your appointments with your specialists as you may have planned  Please go to the XRAY Department in the Basement (go straight as you get off the elevator) for the x-ray testing  Please go to the LAB in the Basement (turn left off the elevator) for the tests to be done today  You will be contacted by phone if any changes need to be made immediately.  Otherwise, you will receive a letter about your results with an explanation, but please check with MyChart first.  Please remember to sign up for MyChart if you have not done so, as this will be important to you in the future with finding out test results, communicating by private email, and scheduling acute appointments online when needed.  If you have Medicare related insurance (such as traditional Medicare, Blue [employer] or [employer], or similar), Please make an appointment at the [facility] with Sharee Pimple, the [employer], for your Wellness Visit in this office, which is a benefit with your insurance.  Please return in 6 months, or sooner if needed

## 2016-05-14 NOTE — Assessment & Plan Note (Signed)
Mild persistent worsening in last several mo with intermittent mild wheezing affecting her exercise tolerance, do not suspect heart dz, pna, chf or pe, most likely related to COPD in the setting of recent wt gain;  Ok for labs today as ordered, and cxr

## 2016-05-14 NOTE — Progress Notes (Signed)
Pre visit review using our clinic review tool, if applicable. No additional management support is needed unless otherwise documented below in the visit note. 

## 2016-05-15 ENCOUNTER — Telehealth: Payer: Self-pay

## 2016-05-15 NOTE — Telephone Encounter (Signed)
PA approved on 05/15/16 Key: [id number] PT notified.

## 2016-06-06 ENCOUNTER — Other Ambulatory Visit: Payer: Self-pay | Admitting: Internal Medicine

## 2016-06-22 ENCOUNTER — Ambulatory Visit (INDEPENDENT_AMBULATORY_CARE_PROVIDER_SITE_OTHER): Payer: Medicare Other | Admitting: Internal Medicine

## 2016-06-22 DIAGNOSIS — R0600 Dyspnea, unspecified: Secondary | ICD-10-CM | POA: Diagnosis not present

## 2016-06-22 DIAGNOSIS — J42 Unspecified chronic bronchitis: Secondary | ICD-10-CM

## 2016-06-22 LAB — PULMONARY FUNCTION TEST
DL/VA % pred: 60 %
DL/VA: 2.72 ml/min/mmHg/L
DLCO UNC: 9.32 ml/min/mmHg
DLCO cor % pred: 45 %
DLCO cor: 9.84 ml/min/mmHg
DLCO unc % pred: 43 %
FEF 25-75 POST: 0.78 L/s
FEF 25-75 PRE: 0.69 L/s
FEF2575-%Change-Post: 13 %
FEF2575-%PRED-PRE: 46 %
FEF2575-%Pred-Post: 52 %
FEV1-%Change-Post: 2 %
FEV1-%PRED-POST: 86 %
FEV1-%Pred-Pre: 84 %
FEV1-POST: 1.63 L
FEV1-PRE: 1.59 L
FEV1FVC-%Change-Post: 1 %
FEV1FVC-%PRED-PRE: 83 %
FEV6-%CHANGE-POST: 1 %
FEV6-%PRED-POST: 104 %
FEV6-%Pred-Pre: 103 %
FEV6-Post: 2.51 L
FEV6-Pre: 2.48 L
FEV6FVC-%CHANGE-POST: 0 %
FEV6FVC-%Pred-Post: 102 %
FEV6FVC-%Pred-Pre: 102 %
FVC-%CHANGE-POST: 1 %
FVC-%Pred-Post: 102 %
FVC-%Pred-Pre: 100 %
FVC-Post: 2.58 L
FVC-Pre: 2.54 L
POST FEV1/FVC RATIO: 63 %
Post FEV6/FVC ratio: 97 %
Pre FEV1/FVC ratio: 62 %
Pre FEV6/FVC Ratio: 98 %
RV % PRED: 127 %
RV: 2.82 L
TLC % PRED: 106 %
TLC: 5.07 L

## 2016-06-22 NOTE — Progress Notes (Signed)
PFT done today. 

## 2016-07-03 ENCOUNTER — Telehealth: Payer: Self-pay | Admitting: Internal Medicine

## 2016-07-03 NOTE — Telephone Encounter (Signed)
Pt requesting PFT results.   CY please advise.  Thanks.

## 2016-07-04 NOTE — Telephone Encounter (Signed)
PFT showed mild airflow obstruction and reduced Diffusion Capacity. We are not following her and the study was ordered by Dr Jenny Reichmann. She will need to contact him for what these results mean for her.

## 2016-07-06 NOTE — Telephone Encounter (Signed)
Patient returned call, [phone].

## 2016-07-06 NOTE — Telephone Encounter (Signed)
Spoke with patient, states that she has already spoken with Dr Gwynn Burly office regarding this test and they advised her to contact our office -- we have explained the results to the patient but has advised per Dr Annamaria Boots that she needs to follow up with Dr Jenny Reichmann for further instructions. Will send back to Dr Jenny Reichmann for either him or his nurse to contact the patient with further instructions.

## 2016-07-06 NOTE — Telephone Encounter (Signed)
LMTCB

## 2016-07-07 ENCOUNTER — Other Ambulatory Visit: Payer: Self-pay | Admitting: Internal Medicine

## 2016-07-07 DIAGNOSIS — R942 Abnormal results of pulmonary function studies: Secondary | ICD-10-CM

## 2016-07-07 NOTE — Telephone Encounter (Signed)
Unfortunately due to the holidays and family attention, I have been unable to get to certain test results  Will address asap

## 2016-07-08 ENCOUNTER — Telehealth: Payer: Self-pay

## 2016-07-08 NOTE — Telephone Encounter (Signed)
Pt has been informed and expressed understanding.  

## 2016-07-08 NOTE — Telephone Encounter (Signed)
-----   Message from Biagio Borg, MD sent at 07/07/2016  9:15 PM EDT ----- Left message on MyChart, pt to cont same tx except  The test results show that your current treatment is OK, except the test is suggestive but not diagnostic for COPD, and there may be other problem that involves the interstitium of the lungs that affect oxygen flow in to the blood.  Because the reason for this is not clear, we will refer you to Pulmonary for further consideration.    Shirron to please inform pt, I will do referral

## 2016-07-10 NOTE — Telephone Encounter (Signed)
Will forward this message over to Dr. Jenny Reichmann

## 2016-07-10 NOTE — Telephone Encounter (Signed)
Results have already been placed on Mychart  Pt has already been notified by phone by Shirron about this prior to today  Pt has been referred to [facility] for abnormal PFT of unclear signfiicance

## 2016-07-13 NOTE — Telephone Encounter (Signed)
Pt has pending appt with JN on 7/20 for a consult. Nothing further is needed.

## 2016-07-15 DIAGNOSIS — M79642 Pain in left hand: Secondary | ICD-10-CM | POA: Diagnosis not present

## 2016-07-15 DIAGNOSIS — M79641 Pain in right hand: Secondary | ICD-10-CM | POA: Diagnosis not present

## 2016-08-12 ENCOUNTER — Telehealth: Payer: Self-pay | Admitting: Internal Medicine

## 2016-08-12 NOTE — Telephone Encounter (Signed)
Pt said that she is no longer needing to be worked in

## 2016-08-12 NOTE — Telephone Encounter (Signed)
Pt is having unbearable hip pain and would like to know if Dr Tamala Julian would work her  In asap

## 2016-08-12 NOTE — Telephone Encounter (Signed)
Noted  

## 2016-08-13 DIAGNOSIS — M25552 Pain in left hip: Secondary | ICD-10-CM | POA: Diagnosis not present

## 2016-08-13 DIAGNOSIS — M545 Low back pain: Secondary | ICD-10-CM | POA: Diagnosis not present

## 2016-08-13 DIAGNOSIS — M25551 Pain in right hip: Secondary | ICD-10-CM | POA: Diagnosis not present

## 2016-08-21 ENCOUNTER — Institutional Professional Consult (permissible substitution): Payer: Medicare Other | Admitting: Pulmonary Disease

## 2016-08-22 ENCOUNTER — Other Ambulatory Visit: Payer: Self-pay | Admitting: Internal Medicine

## 2016-08-25 NOTE — Telephone Encounter (Signed)
faxed

## 2016-08-25 NOTE — Telephone Encounter (Signed)
Done hardcopy to Shirron  

## 2016-08-27 ENCOUNTER — Encounter: Payer: Self-pay | Admitting: Internal Medicine

## 2016-08-27 ENCOUNTER — Ambulatory Visit (INDEPENDENT_AMBULATORY_CARE_PROVIDER_SITE_OTHER): Payer: Medicare Other | Admitting: Internal Medicine

## 2016-08-27 VITALS — BP 122/76 | HR 79 | Ht 62.0 in | Wt 157.0 lb

## 2016-08-27 DIAGNOSIS — I6523 Occlusion and stenosis of bilateral carotid arteries: Secondary | ICD-10-CM | POA: Diagnosis not present

## 2016-08-27 DIAGNOSIS — J449 Chronic obstructive pulmonary disease, unspecified: Secondary | ICD-10-CM | POA: Diagnosis not present

## 2016-08-27 MED ORDER — TIOTROPIUM BROMIDE MONOHYDRATE 2.5 MCG/ACT IN AERS: 2.0000 | INHALATION_SPRAY | Freq: Every day | RESPIRATORY_TRACT | 11 refills | Status: DC

## 2016-08-27 MED ORDER — TIOTROPIUM BROMIDE MONOHYDRATE 2.5 MCG/ACT IN AERS: 2.0000 | INHALATION_SPRAY | Freq: Every day | RESPIRATORY_TRACT | 0 refills | Status: DC

## 2016-08-27 NOTE — Progress Notes (Signed)
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 Pulmonary Consult    Referred by Dr. Cathlean Cower for eval of abnormal PFT. Pt c/o SOB for the past year, progressively getting worse. She states she tires very easily and her exercise tolerance has gone downhill.  She has rx for albuterol inhaler, but never had it filled.   indolent onset doe x 2012 worse since summer 2017 but since then about the same  [place] MMRC1 = can walk nl pace, flat grade, can't hurry or go uphills or steps s sob   Was walking 18 min / mile prior to 23 min mile now  Taking prilosec at least one daily right before bfast for overt HB Was prescribed inhalers but never filled rx or tried one of any kind  No obvious day to day or daytime variability or assoc excess/ purulent sputum or mucus plugs or hemoptysis or cp or chest tightness, subjective wheeze or overt sinus   symptoms. No unusual exp hx or h/o childhood pna/ asthma or knowledge of premature birth.  Sleeping ok without nocturnal  or early am exacerbation  of respiratory  c/o's or need for noct saba. Also denies any obvious fluctuation of symptoms with weather or environmental changes or other aggravating or alleviating factors except as outlined above   Current Medications, Allergies, Complete Past Medical History, Past Surgical History, Family History, and Social History were reviewed in [employer] record.  ROS  The following are not active complaints unless bolded sore throat, dysphagia, heart burn,  dental problems, itching, sneezing,  nasal congestion or excess/ purulent secretions, ear ache,   fever, chills, sweats, unintended wt loss, classically pleuritic or exertional cp,  orthopnea pnd or legs/ feet swelling, presyncope, palpitations, abdominal pain, anorexia, nausea, vomiting, diarrhea  or change in bowel or bladder habits, change in stools or urine, dysuria,hematuria,  rash, arthralgias, visual complaints, headache, numbness, weakness or ataxia or problems with walking or coordination,  change in mood/affect or memory.              Review of Systems     Objective:   Physical Exam    amb wf nad    Wt Readings from Last 3 Encounters:  08/27/16 157 lb (71.2 kg)  05/14/16 163 lb (73.9 kg)  01/14/16 160 lb (72.6 kg)    Vital signs reviewed   HEENT: nl dentition, turbinates bilaterally, and oropharynx. Nl external ear canals without cough reflex   NECK :  without JVD/Nodes/TM/ nl carotid upstrokes bilaterally   LUNGS: no acc muscle use,  Nl contour chest which is clear to A and P bilaterally without cough on insp or exp maneuvers   CV:  RRR  no s3 or murmur or increase in P2, and no edema   ABD:  soft and nontender with nl inspiratory excursion in the supine position. No bruits or organomegaly appreciated, bowel sounds nl  MS:  Nl gait/ ext warm  without deformities, calf tenderness, cyanosis or clubbing No obvious joint restrictions   SKIN: warm and dry without lesions    NEURO:  alert, approp, nl sensorium with  no motor or cerebellar deficits apparent.      I personally reviewed images and agree with radiology impression as follows:  CXR:   05/25/16  1. COPD without evidence of acute cardiopulmonary process. 2. Aortic atherosclerosis.  Assessment:

## 2016-08-27 NOTE — Patient Instructions (Addendum)
Spiriva 2 puffs each am x 2 week sample and if your ex tolerance is better, stay on it indefinitely   Work on inhaler technique:  relax and gently blow all the way out then take a nice smooth deep breath back in, triggering the inhaler at same time you start breathing in.  Hold for up to 5 seconds if you can. Blow out thru nose. Rinse and gargle with water when done   You have mild copd (GOLD I) and as long as you breathe clean air it is unlikely to get worse, so treatment is based on symptoms and up to you to decide whether the symptoms warrant the expense of the medications as there are no generics   If you are satisfied with your treatment plan,  let your doctor know and he/she can either refill your medications or you can return here when your prescription runs out.     If in any way you are not 100% satisfied,  please tell us.  If 100% better, tell your friends!  Pulmonary follow up is as needed

## 2016-08-28 NOTE — Assessment & Plan Note (Signed)
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 so may benefit from spiriva trial and ineffective then lama/laba could be tried (stiolto, bevespi or anoro) though I would avoid ics here in all forms unless starts having more of an AB pattern    I reviewed the Fletcher curve with the patient that basically indicates  if you quit smoking when your best day FEV1 is still well preserved (as is clearly  the case here)  it is highly unlikely you will progress to severe disease and informed the patient there was  no medication on the market that has proven to alter the curve/ its downward trajectory  or the likelihood of progression of their disease(unlike other chronic medical conditions such as atheroclerosis where we do think we can change the natural hx with risk reducing meds)    Therefore stopping smoking and maintaining abstinence is the most important aspect of care, not choice of inhalers or for that matter, doctors.   Pulmonary f/u can therefor be prn   Also discussed:  Formulary restrictions will be an ongoing challenge for the forseable future and I would be happy to pick an alternative if the pt will first  provide me a list of them but pt  will need to return here for training for any new device that is required eg dpi vs hfa vs respimat.    In meantime we can always provide samples so the patient never runs out of any needed respiratory medications.    Total time devoted to counseling  > 50 % of initial 60 min office visit:   review case with pt/ discussion of options/alternatives/ personally creating written customized instructions  in presence of pt  then going over those specific  Instructions directly with the pt including how to use all of the meds but in particular covering each new medication in detail and the difference between the maintenance= "automatic" meds and the prns using an action plan format for the latter (If this problem/symptom => do that organization reading Left to right).  Please see AVS from this visit for a full list of these instructions which I personally wrote for this pt and  are unique to this visit.

## 2016-09-07 DIAGNOSIS — Z86018 Personal history of other benign neoplasm: Secondary | ICD-10-CM | POA: Diagnosis not present

## 2016-09-07 DIAGNOSIS — L821 Other seborrheic keratosis: Secondary | ICD-10-CM | POA: Diagnosis not present

## 2016-09-07 DIAGNOSIS — D225 Melanocytic nevi of trunk: Secondary | ICD-10-CM | POA: Diagnosis not present

## 2016-09-07 DIAGNOSIS — D1800 Hemangioma unspecified site: Secondary | ICD-10-CM | POA: Diagnosis not present

## 2016-09-07 DIAGNOSIS — L814 Other melanin hyperpigmentation: Secondary | ICD-10-CM | POA: Diagnosis not present

## 2016-09-18 ENCOUNTER — Other Ambulatory Visit: Payer: Self-pay | Admitting: Internal Medicine

## 2016-10-29 DIAGNOSIS — Z23 Encounter for immunization: Secondary | ICD-10-CM | POA: Diagnosis not present

## 2016-11-23 ENCOUNTER — Encounter: Payer: Self-pay | Admitting: Internal Medicine

## 2016-11-25 MED ORDER — OMEPRAZOLE 20 MG PO CPDR: 20.0000 mg | DELAYED_RELEASE_CAPSULE | Freq: Every day | ORAL | 0 refills | Status: DC

## 2016-12-18 ENCOUNTER — Other Ambulatory Visit: Payer: Self-pay | Admitting: Internal Medicine

## 2016-12-21 DIAGNOSIS — R922 Inconclusive mammogram: Secondary | ICD-10-CM | POA: Diagnosis not present

## 2016-12-21 LAB — HM MAMMOGRAPHY

## 2016-12-28 DIAGNOSIS — H353121 Nonexudative age-related macular degeneration, left eye, early dry stage: Secondary | ICD-10-CM | POA: Diagnosis not present

## 2016-12-28 DIAGNOSIS — H353112 Nonexudative age-related macular degeneration, right eye, intermediate dry stage: Secondary | ICD-10-CM | POA: Diagnosis not present

## 2016-12-28 DIAGNOSIS — H40013 Open angle with borderline findings, low risk, bilateral: Secondary | ICD-10-CM | POA: Diagnosis not present

## 2016-12-28 DIAGNOSIS — Z961 Presence of intraocular lens: Secondary | ICD-10-CM | POA: Diagnosis not present

## 2017-02-13 ENCOUNTER — Other Ambulatory Visit: Payer: Self-pay | Admitting: Internal Medicine

## 2017-03-15 ENCOUNTER — Other Ambulatory Visit: Payer: Self-pay | Admitting: Internal Medicine

## 2017-03-19 ENCOUNTER — Ambulatory Visit (HOSPITAL_COMMUNITY)
Admission: RE | Admit: 2017-03-19 | Discharge: 2017-03-19 | Disposition: A | Discharge status: Home / Self Care | Payer: Medicare Other | Source: Ambulatory Visit | Attending: Cardiology | Admitting: Cardiology

## 2017-03-19 DIAGNOSIS — I6523 Occlusion and stenosis of bilateral carotid arteries: Secondary | ICD-10-CM | POA: Diagnosis not present

## 2017-03-19 DIAGNOSIS — I779 Disorder of arteries and arterioles, unspecified: Secondary | ICD-10-CM

## 2017-03-19 DIAGNOSIS — I739 Peripheral vascular disease, unspecified: Secondary | ICD-10-CM

## 2017-03-25 ENCOUNTER — Other Ambulatory Visit: Payer: Self-pay | Admitting: Cardiovascular Disease

## 2017-03-25 DIAGNOSIS — I6522 Occlusion and stenosis of left carotid artery: Secondary | ICD-10-CM

## 2017-04-07 ENCOUNTER — Ambulatory Visit: Payer: Medicare Other | Admitting: Internal Medicine

## 2017-04-09 ENCOUNTER — Encounter: Payer: Self-pay | Admitting: Internal Medicine

## 2017-04-09 ENCOUNTER — Other Ambulatory Visit (INDEPENDENT_AMBULATORY_CARE_PROVIDER_SITE_OTHER): Payer: Medicare Other

## 2017-04-09 ENCOUNTER — Ambulatory Visit (INDEPENDENT_AMBULATORY_CARE_PROVIDER_SITE_OTHER): Payer: Medicare Other | Admitting: Internal Medicine

## 2017-04-09 VITALS — BP 142/86 | HR 93 | Temp 98.1°F | Ht 62.0 in | Wt 169.0 lb

## 2017-04-09 DIAGNOSIS — E785 Hyperlipidemia, unspecified: Secondary | ICD-10-CM

## 2017-04-09 DIAGNOSIS — F411 Generalized anxiety disorder: Secondary | ICD-10-CM

## 2017-04-09 DIAGNOSIS — I6522 Occlusion and stenosis of left carotid artery: Secondary | ICD-10-CM | POA: Diagnosis not present

## 2017-04-09 DIAGNOSIS — M25512 Pain in left shoulder: Secondary | ICD-10-CM

## 2017-04-09 DIAGNOSIS — J449 Chronic obstructive pulmonary disease, unspecified: Secondary | ICD-10-CM | POA: Diagnosis not present

## 2017-04-09 DIAGNOSIS — M5412 Radiculopathy, cervical region: Secondary | ICD-10-CM | POA: Insufficient documentation

## 2017-04-09 LAB — CBC WITH DIFFERENTIAL/PLATELET
BASOS PCT: 0.4 % (ref 0.0–3.0)
Basophils Absolute: 0 10*3/uL (ref 0.0–0.1)
EOS PCT: 3.5 % (ref 0.0–5.0)
Eosinophils Absolute: 0.3 10*3/uL (ref 0.0–0.7)
HCT: 36 % (ref 36.0–46.0)
HEMOGLOBIN: 12.3 g/dL (ref 12.0–15.0)
LYMPHS PCT: 24.8 % (ref 12.0–46.0)
Lymphs Abs: 2.2 10*3/uL (ref 0.7–4.0)
MCHC: 34.2 g/dL (ref 30.0–36.0)
MCV: 85.6 fl (ref 78.0–100.0)
Monocytes Absolute: 0.6 10*3/uL (ref 0.1–1.0)
Monocytes Relative: 7.2 % (ref 3.0–12.0)
Neutro Abs: 5.6 10*3/uL (ref 1.4–7.7)
Neutrophils Relative %: 64.1 % (ref 43.0–77.0)
Platelets: 295 10*3/uL (ref 150.0–400.0)
RBC: 4.21 Mil/uL (ref 3.87–5.11)
RDW: 13.9 % (ref 11.5–15.5)
WBC: 8.8 10*3/uL (ref 4.0–10.5)

## 2017-04-09 LAB — HEPATIC FUNCTION PANEL
ALT: 15 U/L (ref 0–35)
AST: 19 U/L (ref 0–37)
Albumin: 4.1 g/dL (ref 3.5–5.2)
Alkaline Phosphatase: 60 U/L (ref 39–117)
BILIRUBIN DIRECT: 0.1 mg/dL (ref 0.0–0.3)
TOTAL PROTEIN: 7.2 g/dL (ref 6.0–8.3)
Total Bilirubin: 0.3 mg/dL (ref 0.2–1.2)

## 2017-04-09 LAB — URINALYSIS, ROUTINE W REFLEX MICROSCOPIC
BILIRUBIN URINE: NEGATIVE
Hgb urine dipstick: NEGATIVE
KETONES UR: NEGATIVE
Leukocytes, UA: NEGATIVE
NITRITE: NEGATIVE
RBC / HPF: NONE SEEN (ref 0–?)
SPECIFIC GRAVITY, URINE: 1.025 (ref 1.000–1.030)
Total Protein, Urine: NEGATIVE
URINE GLUCOSE: NEGATIVE
UROBILINOGEN UA: 0.2 (ref 0.0–1.0)
pH: 5.5 (ref 5.0–8.0)

## 2017-04-09 LAB — BASIC METABOLIC PANEL
BUN: 19 mg/dL (ref 6–23)
CALCIUM: 9.4 mg/dL (ref 8.4–10.5)
CO2: 27 mEq/L (ref 19–32)
Chloride: 107 mEq/L (ref 96–112)
Creatinine, Ser: 0.87 mg/dL (ref 0.40–1.20)
GFR: 67.1 mL/min (ref >60.00)
Glucose, Bld: 104 mg/dL — ABNORMAL HIGH (ref 70–99)
POTASSIUM: 4 meq/L (ref 3.5–5.1)
SODIUM: 142 meq/L (ref 135–145)

## 2017-04-09 LAB — LDL CHOLESTEROL, DIRECT: LDL DIRECT: 99 mg/dL

## 2017-04-09 LAB — LIPID PANEL
CHOLESTEROL: 190 mg/dL (ref 0–200)
HDL: 50.5 mg/dL (ref >39.00)
NonHDL: 139.4
Total CHOL/HDL Ratio: 4
Triglycerides: 344 mg/dL — ABNORMAL HIGH (ref 0.0–149.0)
VLDL: 68.8 mg/dL — AB (ref 0.0–40.0)

## 2017-04-09 LAB — TSH: TSH: 2.62 u[IU]/mL (ref 0.35–4.50)

## 2017-04-09 MED ORDER — CLOTRIMAZOLE-BETAMETHASONE 1-0.05 % EX CREA: 1.0000 "application " | TOPICAL_CREAM | Freq: Two times a day (BID) | CUTANEOUS | 1 refills | Status: DC | PRN

## 2017-04-09 NOTE — Patient Instructions (Addendum)
Please continue all other medications as before, and refills have been done if requested - the lotrisone.  Please make an Appt with Dr Tamala Julian at the checkout desk  Please have the pharmacy call with any other refills you may need.  Please continue your efforts at being more active, low cholesterol diet, and weight control.  You are otherwise up to date with prevention measures today.  Please keep your appointments with your specialists as you may have planned  Please go to the LAB in the Basement (turn left off the elevator) for the tests to be done today  You will be contacted by phone if any changes need to be made immediately.  Otherwise, you will receive a letter about your results with an explanation, but please check with MyChart first.  Please remember to sign up for MyChart if you have not done so, as this will be important to you in the future with finding out test results, communicating by private email, and scheduling acute appointments online when needed.  Please return in 1 year for your yearly visit, or sooner if needed

## 2017-04-09 NOTE — Progress Notes (Signed)
Subjective:    Patient ID: Stacey [name], female    DOB: 1940-04-20, 77 y.o.   MRN: [id number]  HPI  Here with c/o several months left neck pain, sharp, intermittent, radiates to prox LUE with numbness and pain, stopped wearing her purse on the shoulder and thought might be better.  Pt denies bowel or bladder change, fever, wt loss,  worsening LE pain/numbness/weakness, gait change or falls.  Has also hx of left shoulder AC arthritis, rotater cuff tear and bursitis tx per sports medicine dec 2017.   Plans to have distal RUE surgury per hand surgury in 2020, holding off until then.  Hx of left shoulder "cyst" per pt.  Also has Has hx of LUE lymphedema, wears the sleeve and gives long hx regarding onset, treatment, sleeve use.  Pt denies chest pain, increased sob or doe, wheezing, orthopnea, PND, increased LE swelling, palpitations, dizziness or syncope, had some increased sob/doe recently, but spiriva helps Past Medical History:  Diagnosis Date  . ANEMIA-NOS   . ANXIETY   . Breast cancer ([place]) 1997 L, 2012 R   s/p chemo/xrt  . COPD    resolved  . DIVERTICULOSIS, COLON 2008  . Dizziness   . GERD   . Hx of radiation therapy 10/19/11 -12/03/11   right breast  . HYPERLIPIDEMIA   . IRRITABLE BOWEL SYNDROME, HX OF   . Left-sided carotid artery disease (HCC)    moderate left ICA stenosis  . OSTEOARTHRITIS, HAND   . PSVT (paroxysmal supraventricular tachycardia) (HCC)    symptomatic on event monitor   Past Surgical History:  Procedure Laterality Date  . ABDOMINAL HYSTERECTOMY    . APPENDECTOMY    . BREAST BIOPSY  11/14/10    r breast: inv, insitu mammary carcinoma w/calcif, er/pr +, her2 -  . BREAST SURGERY     lumpectomy  . CATARACT EXTRACTION     both eyes  . ELECTROPHYSIOLOGIC STUDY N/A 05/10/2015   Procedure: SVT Ablation;  Surgeon: Will Meredith Leeds, MD;  Location: [facility];  Service: Cardiovascular;  Laterality: N/A;  . HERNIA REPAIR    . inguinal herniorrhapy  1984   left  . rectal fissure repair    . s/p benign breast biopsy  2003   right  . s/p left foot surgury  2009  . 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 spiral fx left foot  2008   no surgury  . TMJ ARTHROPLASTY  1989  . [place]- to remove scar tissue growth   . TONSILLECTOMY      reports that she quit smoking about 15 months ago. Her smoking use included cigarettes. She has a 54.00 pack-year smoking history. she has never used smokeless tobacco. She reports that she drinks alcohol. She reports that she does not use drugs. family history includes Alcohol abuse in her mother; Brain cancer in her cousin; Breast cancer in her cousin, cousin, and cousin; Colon polyps in her mother; Diabetes in her mother; Hypertension in her mother; Lung cancer in her maternal grandfather and paternal uncle; Lung cancer (age of onset: 75) in her maternal grandmother; Lymphoma in her brother; Pancreatic cancer (age of onset: 70) in her mother; Stroke in her mother; Testicular cancer in her cousin; Uterine cancer (age of onset: 26) in her mother. Allergies  Allergen Reactions  . Bee Venom Swelling  . Clindamycin/Lincomycin Diarrhea and Nausea And Vomiting  . Codeine Nausea And Vomiting  Current Outpatient Medications on File Prior to Visit  Medication Sig Dispense Refill  . aspirin 81 MG EC tablet Take 81 mg by mouth daily.      . calcium-vitamin D (OSCAL WITH D) 500-200 MG-UNIT per tablet Take 1 tablet by mouth daily.    . cholecalciferol (VITAMIN D) 1000 UNITS tablet Take 1,000 Units by mouth daily.      . diclofenac sodium (VOLTAREN) 1 % GEL Apply 4 g topically 4 (four) times daily as needed. 400 g 11  . diphenhydrAMINE (BENADRYL) 25 MG tablet Take 25 mg by mouth every 6 (six) hours as needed (for bee stings).     . fluticasone (FLONASE) 50 MCG/ACT nasal spray Place 2 sprays into both nostrils daily.    Marland Kitchen loratadine (CLARITIN) 10 MG tablet Take 10 mg by mouth daily.    . meloxicam (MOBIC)  15 MG tablet TAKE 1 TABLET BY MOUTH ONCE DAILY NEEDED 90 tablet 0  . Multiple Vitamin (MULTIVITAMIN) capsule Take 1 capsule by mouth daily.      . naproxen sodium (ANAPROX) 220 MG tablet Take 440 mg by mouth daily as needed (for arthritis pain).    Marland Kitchen omeprazole (PRILOSEC) 20 MG capsule TAKE 1 CAPSULE(20 MG) BY MOUTH DAILY 90 capsule 0  . polyethylene glycol (MIRALAX / GLYCOLAX) packet take 1 packet once daily if needed for constipation 30 packet 11  . pseudoephedrine (SUDAFED) 30 MG tablet Take 30 mg by mouth every 4 (four) hours as needed for congestion.     . Tiotropium Bromide Monohydrate (SPIRIVA RESPIMAT) 2.5 MCG/ACT AERS Inhale 2 puffs into the lungs daily. 1 Inhaler 11  . zolpidem (AMBIEN) 10 MG tablet take 1 tablet by mouth at bedtime if needed 90 tablet 1   No current facility-administered medications on file prior to visit.    Review of Systems  Constitutional: Negative for other unusual diaphoresis or sweats HENT: Negative for ear discharge or swelling Eyes: Negative for other worsening visual disturbances Respiratory: Negative for stridor or other swelling  Gastrointestinal: Negative for worsening distension or other blood Genitourinary: Negative for retention or other urinary change Musculoskeletal: Negative for other MSK pain or swelling Skin: Negative for color change or other new lesions Neurological: Negative for worsening tremors and other numbness  Psychiatric/Behavioral: Negative for worsening agitation or other fatigue All other system neg per pt    Objective:   Physical Exam BP (!) 142/86   Pulse 93   Temp 98.1 F (36.7 C) (Oral)   Ht '5\' 2"'  (1.575 m)   Wt 169 lb (76.7 kg)   SpO2 96%   BMI 30.91 kg/m  VS noted,  Constitutional: Pt appears in NAD HENT: Head: NCAT.  Right Ear: External ear normal.  Left Ear: External ear normal.  Eyes: . Pupils are equal, round, and reactive to light. Conjunctivae and EOM are normal Nose: without d/c or deformity Neck:  Neck supple. Gross normal ROM Cardiovascular: Normal rate and regular rhythm.   Pulmonary/Chest: Effort normal and breath sounds without rales or wheezing.  Spine nontender, no trapezoid area tender Left shoulder with some discomfort and reduced ROM on forward elevation and abduction Neurological: Pt is alert. At baseline orientation, motor 5/5 intact Skin: Skin is warm. 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

## 2017-04-11 NOTE — Assessment & Plan Note (Signed)
stable overall by history and exam, recent data reviewed with pt, and pt to continue medical treatment as before,  to f/u any worsening symptoms or concerns Lab Results  Component Value Date   LDLCALC 84 07/04/2009

## 2017-04-11 NOTE — Assessment & Plan Note (Signed)
Also for referral sports medicine,  to f/u any worsening symptoms or concerns

## 2017-04-11 NOTE — Assessment & Plan Note (Signed)
stable overall by history and exam, and pt to continue medical treatment as before,  to f/u any worsening symptoms or concerns 

## 2017-04-11 NOTE — Assessment & Plan Note (Signed)
Mild, currently improved, ok to follow with current tx

## 2017-04-20 NOTE — Progress Notes (Signed)
[name] [place],  [address] Phone: [phone] Subjective:    I'm seeing this patient by the request  of:    CC: Left shoulder pain  BEE:[id number]  Stacey [name] is a 77 y.o. female coming in with complaint of left shoulder pain. Chronic. Has numbness and tingling. States the pain is in the joint and radiates to her elbow. Past medical history is significant for breast cancer status post chemo and radiation therapy.  Patient was seen greater than a year ago for left upper extremity swelling.  Workup included MRI of the left humerus and left shoulder.  Found to have mild subcutaneous edema of the upper arm that seem to be dependent edema as well as left shoulder seem to have what appeared to be a glenoid cyst that was septated.  Was at the inferior glenoid neck.  Full body bone scan was done and was unremarkable for any metastasis.   Patient recently started having more than left shoulder and left neck pain.  Patient states that somewhat different than when she had previously.  Not as much as a swelling and feels that that has been fairly doing well.  Was having neck pain and did have a new carotid Doppler that was unremarkable for any progression.  Known to have carotid artery disease.  Patient was having some mild neck pain but denies that she felt like it was associated with her shoulder.  States that the pain in her arm is severe.  Past Medical History:  Diagnosis Date  . ANEMIA-NOS   . ANXIETY   . Breast cancer ([place]) 1997 L, 2012 R   s/p chemo/xrt  . COPD    resolved  . DIVERTICULOSIS, COLON 2008  . Dizziness   . GERD   . Hx of radiation therapy 10/19/11 -12/03/11   right breast  . HYPERLIPIDEMIA   . IRRITABLE BOWEL SYNDROME, HX OF   . Left-sided carotid artery disease (HCC)    moderate left ICA stenosis  . OSTEOARTHRITIS, HAND   . PSVT (paroxysmal supraventricular tachycardia) (HCC)    symptomatic on event monitor   Past  Surgical History:  Procedure Laterality Date  . ABDOMINAL HYSTERECTOMY    . APPENDECTOMY    . BREAST BIOPSY  11/14/10    r breast: inv, insitu mammary carcinoma w/calcif, er/pr +, her2 -  . BREAST SURGERY     lumpectomy  . CATARACT EXTRACTION     both eyes  . ELECTROPHYSIOLOGIC STUDY N/A 05/10/2015   Procedure: SVT Ablation;  Surgeon: Will Meredith Leeds, MD;  Location: [facility];  Service: Cardiovascular;  Laterality: N/A;  . HERNIA REPAIR    . inguinal herniorrhapy  1984   left  . rectal fissure repair    . s/p benign breast biopsy  2003   right  . s/p left foot surgury  2009  . 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 spiral fx left foot  2008   no surgury  . TMJ ARTHROPLASTY  1989  . [place]- to remove scar tissue growth   . TONSILLECTOMY     Social History   Socioeconomic History  . Marital status: Divorced    Spouse name: Not on file  . Number of children: 0  . Years of education: Not on file  . Highest education level: Not on file  Social Needs  . Financial resource strain: Not on  file  . Food insecurity - worry: Not on file  . Food insecurity - inability: Not on file  . Transportation needs - medical: Not on file  . Transportation needs - non-medical: Not on file  Occupational History  . Occupation: retired Banker  Tobacco Use  . Smoking status: Former Smoker    Packs/day: 1.00    Years: 54.00    Pack years: 54.00    Types: Cigarettes    Last attempt to quit: 01/03/2016    Years since quitting: 1.2  . Smokeless tobacco: Never Used  Substance and Sexual Activity  . Alcohol use: Yes    Comment: rare/ drinks socially  . Drug use: No  . Sexual activity: Not on file  Other Topics Concern  . Not on file  Social History Narrative   Patient gets no regular exercise   No biological children   2 step children   Allergies  Allergen Reactions  . Bee Venom Swelling  . Clindamycin/Lincomycin Diarrhea and Nausea And Vomiting  .  Codeine Nausea And Vomiting   Family History  Problem Relation Age of Onset  . Alcohol abuse Mother        ETOH dependence  . Hypertension Mother   . Stroke Mother   . Colon polyps Mother   . Diabetes Mother   . Pancreatic cancer Mother 76  . Uterine cancer Mother 57  . Lymphoma Brother        burkitts  . Lung cancer Paternal Uncle   . Lung cancer Maternal Grandmother 73       non-smoker  . Lung cancer Maternal Grandfather   . Breast cancer Cousin        maternal cousin, dx in her mid 76s  . Brain cancer Cousin        maternal cousin's son; dx in his 41s  . Testicular cancer Cousin        maternal cousin's son;   . Breast cancer Cousin        paternal cousin; dx in her 5s  . Breast cancer Cousin        1 maternal, 2 paternal  . Colon cancer Neg Hx   . Esophageal cancer Neg Hx   . Stomach cancer Neg Hx   . Rectal cancer Neg Hx      Past medical history, social, surgical and family history all reviewed in electronic medical record.  No pertanent information unless stated regarding to the chief complaint.   Review of Systems:Review of systems updated and as accurate as of 04/20/17  No headache, visual changes, nausea, vomiting, diarrhea, constipation, dizziness, abdominal pain, skin rash, fevers, chills, night sweats, weight loss, swollen lymph nodes, body aches, , chest pain, shortness of breath, mood changes.  Positive joint swelling, muscle aches  Objective  There were no vitals taken for this visit. Systems examined below as of 04/20/17   General: No apparent distress alert and oriented x3 mood and affect normal, dressed appropriately.  HEENT: Pupils equal, extraocular movements intact  Respiratory: Patient's speak in full sentences and does not appear short of breath  Cardiovascular: No lower extremity edema, non tender, no erythema  Skin: Warm dry intact with no signs of infection or rash on extremities or on axial skeleton.  Abdomen: Soft nontender  Neuro:  Cranial nerves II through XII are intact, neurovascularly intact in all extremities with 2+ DTRs and 2+ pulses.  Lymph: No lymphadenopathy of posterior or anterior cervical chain or axillae bilaterally.  Gait mild antalgic.  MSK:  tender with full range of motion and good stability and symmetric strength and tone of  elbows, wrist, hip, knee and ankles bilaterally.  Arthritic changes of multiple joints including hands swelling of the left upper extremity noted Neck exam shows loss of range of motion in all planes by at least 10 degrees.  Severe tenderness with Spurling's but no worsening of radicular symptoms.  Crepitus noted.  Grip strength 4 out of 5 but symmetric.  Deep tendon reflexes intact Left shoulder still some some mild impingement with Neer's and Hawkins.  Denies though any numbness with overhead or compression of the brachial plexus.  No masses palpated on the anterior chest wall.   Impression and Recommendations:     This case required medical decision making of moderate complexity.      Note: This dictation was prepared with Dragon dictation along with smaller phrase technology. Any transcriptional errors that result from this process are unintentional.

## 2017-04-21 ENCOUNTER — Encounter: Payer: Self-pay | Admitting: Family Medicine

## 2017-04-21 ENCOUNTER — Ambulatory Visit (INDEPENDENT_AMBULATORY_CARE_PROVIDER_SITE_OTHER): Payer: Medicare Other | Admitting: Family Medicine

## 2017-04-21 ENCOUNTER — Ambulatory Visit: Payer: Self-pay

## 2017-04-21 ENCOUNTER — Ambulatory Visit (INDEPENDENT_AMBULATORY_CARE_PROVIDER_SITE_OTHER)
Admission: RE | Admit: 2017-04-21 | Discharge: 2017-04-21 | Disposition: A | Discharge status: Home / Self Care | Payer: Medicare Other | Source: Ambulatory Visit | Attending: Family Medicine | Admitting: Family Medicine

## 2017-04-21 VITALS — BP 140/70 | HR 101 | Ht 62.0 in | Wt 169.0 lb

## 2017-04-21 DIAGNOSIS — M25512 Pain in left shoulder: Principal | ICD-10-CM

## 2017-04-21 DIAGNOSIS — G8929 Other chronic pain: Secondary | ICD-10-CM | POA: Diagnosis not present

## 2017-04-21 DIAGNOSIS — M5412 Radiculopathy, cervical region: Secondary | ICD-10-CM

## 2017-04-21 DIAGNOSIS — I6522 Occlusion and stenosis of left carotid artery: Secondary | ICD-10-CM

## 2017-04-21 DIAGNOSIS — M542 Cervicalgia: Secondary | ICD-10-CM | POA: Diagnosis not present

## 2017-04-21 MED ORDER — GABAPENTIN 100 MG PO CAPS: 200.0000 mg | ORAL_CAPSULE | Freq: Every day | ORAL | 3 refills | Status: DC

## 2017-04-21 NOTE — Assessment & Plan Note (Signed)
Concerning left shoulder pain is actually secondary to more of a cervical radiculitis.  We discussed with patient and will get x-rays.  Discussed icing regimen and home exercises.  Patient has had a history of breast cancer.  We will make sure there is no metastasis.  Discussed with patient about gabapentin which was prescribed today.  Discussed potential side effects.  Home exercises given to her as well.  Follow-up again 4 weeks

## 2017-04-21 NOTE — Patient Instructions (Signed)
Good to see you  I think it could be coming from your neck  Get xrays downstairs Gabapentin 200mg  at night Ice 20 minutes 2 times daily. Usually after activity and before bed. pennsaid pinkie amount topically 2 times daily as needed.   Increase vitamin D to 2000 IU daily  See me again in 4 weeks

## 2017-04-21 NOTE — Assessment & Plan Note (Signed)
Concern more for patient having the neck and cervical radiculopathy as the primary concern.  Patient had a negative Spurling's but does have significant decrease in range of motion in all planes.  We discussed icing regimen, home exercise, which activities to doing which wants to avoid.  Topical anti-inflammatories given.  Worsening symptoms we will need to consider further evaluation.  Once again history of breast cancer but no fevers, chills, any abnormal weight loss.  Has seen other providers for shortness of breath which patient declined at this point.  History of SVT and patient declined any type of ECG to rule out cardiac etiology.  Patient will follow up with me again in 3-4 weeks

## 2017-04-24 ENCOUNTER — Other Ambulatory Visit: Payer: Self-pay | Admitting: Internal Medicine

## 2017-05-05 ENCOUNTER — Ambulatory Visit (INDEPENDENT_AMBULATORY_CARE_PROVIDER_SITE_OTHER): Payer: Medicare Other | Admitting: Cardiovascular Disease

## 2017-05-05 ENCOUNTER — Encounter: Payer: Self-pay | Admitting: Cardiovascular Disease

## 2017-05-05 VITALS — BP 146/84 | HR 92 | Ht 62.0 in | Wt 172.0 lb

## 2017-05-05 DIAGNOSIS — I471 Supraventricular tachycardia: Secondary | ICD-10-CM

## 2017-05-05 DIAGNOSIS — I6522 Occlusion and stenosis of left carotid artery: Secondary | ICD-10-CM | POA: Diagnosis not present

## 2017-05-05 DIAGNOSIS — R0609 Other forms of dyspnea: Secondary | ICD-10-CM | POA: Diagnosis not present

## 2017-05-05 NOTE — Progress Notes (Signed)
05/05/2017 Stacey [name]   1940/07/17  [id number]  Primary Physician Jenny Reichmann Hunt Oris, MD Primary Cardiologist: Lorretta Harp MD [facility], [place], [place]  HPI:  Stacey [name] is a 77 y.o.  mildly overweight divorced Caucasian female no children. 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 She was referred by Dr. Jenny Reichmann for evaluation of episodic dizziness which has left several years. The last saw her in the office 09/04/15. Her cardiac risk factor profile is notable for 50-pack-years of tobacco abuse currently smoking one pack per day but otherwise is negative. She's never had a heart attack or stroke and denies chest pain or shortness of breath. 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 She's had chemotherapy and radiation therapy. The episodes occur several times a month. The last several seconds at a time. There is no associated syncope. She had an event monitor that showed PSVT and carotid Dopplers that showed bilateral ICA stenosis in the mild-to-moderate range. She had attempt at SVT ablation by Dr. Curt Bears 05/10/15. He was unable to induce the arrhythmia however since that time her symptoms have somewhat improved. She discontinue smoking February 2018. Her major issue however is increasing dyspnea on exertion. She did have a normal 2-D echo performed 02/08/15. She denies chest pain.     Current Meds  Medication Sig  . aspirin 81 MG EC tablet Take 81 mg by mouth daily.    . calcium-vitamin D (OSCAL WITH D) 500-200 MG-UNIT per tablet Take 1 tablet by mouth daily.  . cholecalciferol (VITAMIN D) 1000 UNITS tablet Take 1,000 Units by mouth daily.    . clotrimazole-betamethasone (LOTRISONE) cream Apply 1 application topically 2 (two) times daily as needed.  . diclofenac sodium (VOLTAREN) 1 % GEL Apply 4 g topically 4 (four) times daily as needed.  .  diphenhydrAMINE (BENADRYL) 25 MG tablet Take 25 mg by mouth every 6 (six) hours as needed (for bee stings).   . fluticasone (FLONASE) 50 MCG/ACT nasal spray Place 2 sprays into both nostrils daily.  Marland Kitchen gabapentin (NEURONTIN) 100 MG capsule Take 200 mg by mouth daily.  Marland Kitchen loratadine (CLARITIN) 10 MG tablet Take 10 mg by mouth daily.  . meloxicam (MOBIC) 15 MG tablet TAKE 1 TABLET BY MOUTH ONCE DAILY NEEDED  . Multiple Vitamin (MULTIVITAMIN) capsule Take 1 capsule by mouth daily.    . naproxen sodium (ANAPROX) 220 MG tablet Take 440 mg by mouth daily as needed (for arthritis pain).  Marland Kitchen omeprazole (PRILOSEC) 20 MG capsule Take 1 capsule (20 mg total) by mouth daily. **PT NEEDS APPOINTMENT FOR ADDITIONAL REFILLS**  . polyethylene glycol (MIRALAX / GLYCOLAX) packet take 1 packet once daily if needed for constipation  . pseudoephedrine (SUDAFED) 30 MG tablet Take 30 mg by mouth every 4 (four) hours as needed for congestion.   . Tiotropium Bromide Monohydrate (SPIRIVA RESPIMAT) 2.5 MCG/ACT AERS Inhale 2 puffs into the lungs daily.  Marland Kitchen zolpidem (AMBIEN) 10 MG tablet take 1 tablet by mouth at bedtime if needed     Allergies  Allergen Reactions  . Bee Venom Swelling  . Clindamycin/Lincomycin Diarrhea and Nausea And Vomiting  . Codeine Nausea And Vomiting    Social History   Socioeconomic History  . Marital status: Divorced    Spouse name: Not on file  . Number of children: 0  . Years  of education: Not on file  . Highest education level: Not on file  Occupational History  . Occupation: retired Administrator, sports  . Financial resource strain: Not on file  . Food insecurity:    Worry: Not on file    Inability: Not on file  . Transportation needs:    Medical: Not on file    Non-medical: Not on file  Tobacco Use  . Smoking status: Former Smoker    Packs/day: 1.00    Years: 54.00    Pack years: 54.00    Types: Cigarettes    Last attempt to quit: 01/03/2016    Years since quitting: 1.3   . Smokeless tobacco: Never Used  Substance and Sexual Activity  . Alcohol use: Yes    Comment: rare/ drinks socially  . Drug use: No  . Sexual activity: Not on file  Lifestyle  . Physical activity:    Days per week: Not on file    Minutes per session: Not on file  . Stress: Not on file  Relationships  . Social connections:    Talks on phone: Not on file    Gets together: Not on file    Attends religious service: Not on file    Active member of club or organization: Not on file    Attends meetings of clubs or organizations: Not on file    Relationship status: Not on file  . Intimate partner violence:    Fear of current or ex partner: Not on file    Emotionally abused: Not on file    Physically abused: Not on file    Forced sexual activity: Not on file  Other Topics Concern  . Not on file  Social History Narrative   Patient gets no regular exercise   No biological children   2 step children     Review of Systems: General: negative for chills, fever, night sweats or weight changes.  Cardiovascular: negative for chest pain, dyspnea on exertion, edema, orthopnea, palpitations, paroxysmal nocturnal dyspnea or shortness of breath Dermatological: negative for rash Respiratory: negative for cough or wheezing Urologic: negative for hematuria Abdominal: negative for nausea, vomiting, diarrhea, bright red blood per rectum, melena, or hematemesis Neurologic: negative for visual changes, syncope, or dizziness All other systems reviewed and are otherwise negative except as noted above.    Blood pressure (!) 146/84, pulse 92, height 5\' 2"  (1.575 m), weight 172 lb (78 kg).  General appearance: alert and no distress Neck: no adenopathy, no carotid bruit, no JVD, supple, symmetrical, trachea midline and thyroid not enlarged, symmetric, no tenderness/mass/nodules Lungs: clear to auscultation bilaterally Heart: regular rate and rhythm, S1, S2 normal, no murmur, click, rub or  gallop Extremities: extremities normal, atraumatic, no cyanosis or edema Pulses: 2+ and symmetric Skin: Skin color, texture, turgor normal. No rashes or lesions Neurologic: Alert and oriented X 3, normal strength and tone. Normal symmetric reflexes. Normal coordination and gait  EKG sinus rhythm at 92 with left axis deviation. I personally reviewed this EKG.  ASSESSMENT AND PLAN:   Hyperlipidemia History of hyperlipidemia not on statin therapy followed by her PCP. Her last lipid profile performed 04/09/17 revealed total cholesterol 190 with  A triglyceride level of 344.  SVT (supraventricular tachycardia) (HCC) History of PSVT status post unsuccessful ablation by Dr. Curt Bears several years ago with infrequent recurrence.  Dyspnea on exertion This pain is being seen back again today after last being seen approximately 2 years ago for dyspnea on exertion. She has  a long history of tobacco abuse having quit one year ago as well as radiation therapy for breast cancer. She did have a 2-D echo performed 02/08/15 which was essentially normal except for grade 1 diastolic dysfunction. She denies chest pain. I am going to get a pharmacologic Myoview stress test to rule out ischemic etiology.      Lorretta Harp MD FACP,FACC,FAHA, FSCAI 05/05/2017 3:00 PM

## 2017-05-05 NOTE — Patient Instructions (Signed)
Medication Instructions: Your physician recommends that you continue on your current medications as directed. Please refer to the Current Medication list given to you today.  Testing/Procedures: Your physician has requested that you have a lexiscan myoview. For further information please visit [email or web address]. Please follow instruction sheet, as given.  Follow-Up: Your physician wants you to follow-up in: 1 year with Dr. Gwenlyn Found. You will receive a reminder letter in the mail two months in advance. If you don't receive a letter, please call our office to schedule the follow-up appointment.  If you need a refill on your cardiac medications before your next appointment, please call your pharmacy.

## 2017-05-05 NOTE — Assessment & Plan Note (Signed)
History of PSVT status post unsuccessful ablation by Dr. Curt Bears several years ago with infrequent recurrence.

## 2017-05-05 NOTE — Assessment & Plan Note (Signed)
This pain is being seen back again today after last being seen approximately 2 years ago for dyspnea on exertion. She has a long history of tobacco abuse having quit one year ago as well as radiation therapy for breast cancer. She did have a 2-D echo performed 02/08/15 which was essentially normal except for grade 1 diastolic dysfunction. She denies chest pain. I am going to get a pharmacologic Myoview stress test to rule out ischemic etiology.

## 2017-05-05 NOTE — Assessment & Plan Note (Signed)
History of hyperlipidemia not on statin therapy followed by her PCP. Her last lipid profile performed 04/09/17 revealed total cholesterol 190 with  A triglyceride level of 344.

## 2017-05-07 ENCOUNTER — Telehealth (HOSPITAL_COMMUNITY): Payer: Self-pay

## 2017-05-07 NOTE — Telephone Encounter (Signed)
Encounter complete. 

## 2017-05-12 ENCOUNTER — Ambulatory Visit (HOSPITAL_COMMUNITY)
Admission: RE | Admit: 2017-05-12 | Discharge: 2017-05-12 | Disposition: A | Discharge status: Home / Self Care | Payer: Medicare Other | Source: Ambulatory Visit | Attending: Cardiovascular Disease | Admitting: Cardiovascular Disease

## 2017-05-12 DIAGNOSIS — R0609 Other forms of dyspnea: Secondary | ICD-10-CM | POA: Insufficient documentation

## 2017-05-12 DIAGNOSIS — R9439 Abnormal result of other cardiovascular function study: Secondary | ICD-10-CM | POA: Insufficient documentation

## 2017-05-12 MED ORDER — REGADENOSON 0.4 MG/5ML IV SOLN
0.4000 mg | Freq: Once | INTRAVENOUS | Status: AC
  Administered 2017-05-12: 0.4 mg via INTRAVENOUS

## 2017-05-12 MED ORDER — TECHNETIUM TC 99M TETROFOSMIN IV KIT
32.2000 | PACK | Freq: Once | INTRAVENOUS | Status: AC | PRN
  Administered 2017-05-12: 32.2 via INTRAVENOUS
  Filled 2017-05-12: qty 33

## 2017-05-12 MED ORDER — TECHNETIUM TC 99M TETROFOSMIN IV KIT
9.7000 | PACK | Freq: Once | INTRAVENOUS | Status: AC | PRN
  Administered 2017-05-12: 9.7 via INTRAVENOUS
  Filled 2017-05-12: qty 10

## 2017-05-13 LAB — MYOCARDIAL PERFUSION IMAGING
CHL CUP NUCLEAR SRS: 7
CHL CUP RESTING HR STRESS: 72 {beats}/min
CSEPPHR: 99 {beats}/min
LV dias vol: 91 mL (ref 46–106)
LV sys vol: 51 mL
SDS: 0
SSS: 7
TID: 1.08

## 2017-05-19 ENCOUNTER — Ambulatory Visit (INDEPENDENT_AMBULATORY_CARE_PROVIDER_SITE_OTHER): Payer: Medicare Other | Admitting: Family Medicine

## 2017-05-19 ENCOUNTER — Other Ambulatory Visit: Payer: Self-pay | Admitting: Cardiovascular Disease

## 2017-05-19 ENCOUNTER — Encounter: Payer: Self-pay | Admitting: Family Medicine

## 2017-05-19 VITALS — BP 138/82 | HR 85 | Ht 62.0 in | Wt 169.0 lb

## 2017-05-19 DIAGNOSIS — G8929 Other chronic pain: Secondary | ICD-10-CM

## 2017-05-19 DIAGNOSIS — M25512 Pain in left shoulder: Secondary | ICD-10-CM | POA: Diagnosis not present

## 2017-05-19 DIAGNOSIS — M5412 Radiculopathy, cervical region: Secondary | ICD-10-CM | POA: Diagnosis not present

## 2017-05-19 DIAGNOSIS — M542 Cervicalgia: Secondary | ICD-10-CM | POA: Diagnosis not present

## 2017-05-19 DIAGNOSIS — I6522 Occlusion and stenosis of left carotid artery: Secondary | ICD-10-CM | POA: Diagnosis not present

## 2017-05-19 DIAGNOSIS — I519 Heart disease, unspecified: Secondary | ICD-10-CM

## 2017-05-19 NOTE — Patient Instructions (Addendum)
Good to see you  We will get you in with a PT here soon.  I think it will be great  Ice 20 minutes 2 times daily. Usually after activity and before bed. Keep hands within peripheral vision.   See me again in 6 weeks

## 2017-05-19 NOTE — Progress Notes (Signed)
[name] [place], [place] [address] Phone: [phone] Subjective:     CC: Left shoulder neck pain  JZP:[id number]  Stacey [name] is a 77 y.o. female coming in with complaint of left shoulder and neck pain. She continues to have pain and tingling in the left hand. She has not seen a difference in her nerve pain with gabapentin. Found to have arthritis of the neck and shoulder.  Patient is having still the same amount of pain.  Has been fairly noncompliant on the exercises.  Past medical history is significant for breast cancer.  We did have an MRI of the shoulder previously showing some subchondral cyst formation of the bone.  This was 2 years ago.      Past Medical History:  Diagnosis Date  . ANEMIA-NOS   . ANXIETY   . Breast cancer ([place]) 1997 L, 2012 R   s/p chemo/xrt  . COPD    resolved  . DIVERTICULOSIS, COLON 2008  . Dizziness   . GERD   . Hx of radiation therapy 10/19/11 -12/03/11   right breast  . HYPERLIPIDEMIA   . IRRITABLE BOWEL SYNDROME, HX OF   . Left-sided carotid artery disease (HCC)    moderate left ICA stenosis  . OSTEOARTHRITIS, HAND   . PSVT (paroxysmal supraventricular tachycardia) (HCC)    symptomatic on event monitor   Past Surgical History:  Procedure Laterality Date  . ABDOMINAL HYSTERECTOMY    . APPENDECTOMY    . BREAST BIOPSY  11/14/10    r breast: inv, insitu mammary carcinoma w/calcif, er/pr +, her2 -  . BREAST SURGERY     lumpectomy  . CATARACT EXTRACTION     both eyes  . ELECTROPHYSIOLOGIC STUDY N/A 05/10/2015   Procedure: SVT Ablation;  Surgeon: Will Meredith Leeds, MD;  Location: [facility];  Service: Cardiovascular;  Laterality: N/A;  . HERNIA REPAIR    . inguinal herniorrhapy  1984   left  . rectal fissure repair    . s/p benign breast biopsy  2003   right  . s/p left foot surgury  2009  . 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 spiral fx left foot  2008   no surgury  .  TMJ ARTHROPLASTY  1989  . [place]- to remove scar tissue growth   . TONSILLECTOMY     Social History   Socioeconomic History  . Marital status: Divorced    Spouse name: Not on file  . Number of children: 0  . Years of education: Not on file  . Highest education level: Not on file  Occupational History  . Occupation: retired Administrator, sports  . Financial resource strain: Not on file  . Food insecurity:    Worry: Not on file    Inability: Not on file  . Transportation needs:    Medical: Not on file    Non-medical: Not on file  Tobacco Use  . Smoking status: Former Smoker    Packs/day: 1.00    Years: 54.00    Pack years: 54.00    Types: Cigarettes    Last attempt to quit: 01/03/2016    Years since quitting: 1.3  . Smokeless tobacco: Never Used  Substance and Sexual Activity  . Alcohol use: Yes    Comment: rare/ drinks socially  . Drug use: No  . Sexual activity: Not on file  Lifestyle  .  Physical activity:    Days per week: Not on file    Minutes per session: Not on file  . Stress: Not on file  Relationships  . Social connections:    Talks on phone: Not on file    Gets together: Not on file    Attends religious service: Not on file    Active member of club or organization: Not on file    Attends meetings of clubs or organizations: Not on file    Relationship status: Not on file  Other Topics Concern  . Not on file  Social History Narrative   Patient gets no regular exercise   No biological children   2 step children   Allergies  Allergen Reactions  . Bee Venom Swelling  . Clindamycin/Lincomycin Diarrhea and Nausea And Vomiting  . Codeine Nausea And Vomiting   Family History  Problem Relation Age of Onset  . Alcohol abuse Mother        ETOH dependence  . Hypertension Mother   . Stroke Mother   . Colon polyps Mother   . Diabetes Mother   . Pancreatic cancer Mother 60  . Uterine cancer Mother 55  . Lymphoma Brother         burkitts  . Lung cancer Paternal Uncle   . Lung cancer Maternal Grandmother 42       non-smoker  . Lung cancer Maternal Grandfather   . Breast cancer Cousin        maternal cousin, dx in her mid 15s  . Brain cancer Cousin        maternal cousin's son; dx in his 49s  . Testicular cancer Cousin        maternal cousin's son;   . Breast cancer Cousin        paternal cousin; dx in her 22s  . Breast cancer Cousin        1 maternal, 2 paternal  . Colon cancer Neg Hx   . Esophageal cancer Neg Hx   . Stomach cancer Neg Hx   . Rectal cancer Neg Hx      Past medical history, social, surgical and family history all reviewed in electronic medical record.  No pertanent information unless stated regarding to the chief complaint.   Review of Systems:Review of systems updated and as accurate as of 05/19/17  No headache, visual changes, nausea, vomiting, diarrhea, constipation, dizziness, abdominal pain, skin rash, fevers, chills, night sweats, weight loss, swollen lymph nodes, body aches, joint swelling,, chest pain, shortness of breath, mood changes.  Positive muscle aches  Objective  Blood pressure 138/82, pulse 85, height '5\' 2"'  (1.575 m), weight 169 lb (76.7 kg), SpO2 97 %. Systems examined below as of 05/19/17   General: No apparent distress alert and oriented x3 mood and affect normal, dressed appropriately.  HEENT: Pupils equal, extraocular movements intact  Respiratory: Patient's speak in full sentences and does not appear short of breath  Cardiovascular: No lower extremity edema, non tender, no erythema  Skin: Warm dry intact with no signs of infection or rash on extremities or on axial skeleton.  Abdomen: Soft nontender  Neuro: Cranial nerves II through XII are intact, neurovascularly intact in all extremities with 2+ DTRs and 2+ pulses.  Lymph: No lymphadenopathy of posterior or anterior cervical chain or axillae bilaterally.  Gait normal with good balance and coordination.  MSK:  Mild tender with full range of motion and good stability and symmetric strength and tone of elbows, wrist, hip, knee  and ankles bilaterally.  Arthritic changes of multiple joints  Neck: Inspection loss of lordosis. Marland Kitchen No palpable stepoffs. Positive Spurling's maneuver. limited range of motion  Grip strength and sensation normal in bilateral hands No sensory change to C4 to T1 Negative Hoffman sign bilaterally Reflexes normal Positive tightness of the trapezius.  Left shoulder arthritic changes. Positive impingement. 4/5 strength      Impression and Recommendations:     This case required medical decision making of moderate complexity.      Note: This dictation was prepared with Dragon dictation along with smaller phrase technology. Any transcriptional errors that result from this process are unintentional.

## 2017-05-19 NOTE — Assessment & Plan Note (Signed)
Patient had a cervical radiculopathy.  No significant improvement.  We will send patient to formal physical therapy.  Patient does not want any advanced imaging at this time and declined that.  Follow-up again 4 weeks

## 2017-05-25 ENCOUNTER — Ambulatory Visit (HOSPITAL_COMMUNITY): Payer: Medicare Other | Attending: Cardiovascular Disease

## 2017-05-25 ENCOUNTER — Other Ambulatory Visit: Payer: Self-pay

## 2017-05-25 DIAGNOSIS — Z923 Personal history of irradiation: Secondary | ICD-10-CM | POA: Insufficient documentation

## 2017-05-25 DIAGNOSIS — J449 Chronic obstructive pulmonary disease, unspecified: Secondary | ICD-10-CM | POA: Insufficient documentation

## 2017-05-25 DIAGNOSIS — E785 Hyperlipidemia, unspecified: Secondary | ICD-10-CM | POA: Diagnosis not present

## 2017-05-25 DIAGNOSIS — Z853 Personal history of malignant neoplasm of breast: Secondary | ICD-10-CM | POA: Insufficient documentation

## 2017-05-25 DIAGNOSIS — Z9221 Personal history of antineoplastic chemotherapy: Secondary | ICD-10-CM | POA: Insufficient documentation

## 2017-05-25 DIAGNOSIS — Z87891 Personal history of nicotine dependence: Secondary | ICD-10-CM | POA: Diagnosis not present

## 2017-05-25 DIAGNOSIS — I519 Heart disease, unspecified: Secondary | ICD-10-CM | POA: Diagnosis not present

## 2017-06-01 ENCOUNTER — Ambulatory Visit (INDEPENDENT_AMBULATORY_CARE_PROVIDER_SITE_OTHER): Payer: Medicare Other | Admitting: Physical Therapy

## 2017-06-01 ENCOUNTER — Encounter: Payer: Self-pay | Admitting: Physical Therapy

## 2017-06-01 DIAGNOSIS — M542 Cervicalgia: Secondary | ICD-10-CM

## 2017-06-01 DIAGNOSIS — G8929 Other chronic pain: Secondary | ICD-10-CM | POA: Diagnosis not present

## 2017-06-01 DIAGNOSIS — M25512 Pain in left shoulder: Secondary | ICD-10-CM

## 2017-06-02 NOTE — Therapy (Signed)
[facility] [address] [place], [place], [address] Phone: [phone]   Fax:  [phone]  Physical Therapy Evaluation  Patient Details  Name: Stacey [name] MRN: [id number] Date of Birth: [birth year] Referring Provider: Charlann Boxer   Encounter Date: 06/01/2017  PT End of Session - 06/01/17 1620    Visit Number  1    Number of Visits  12    Date for PT Re-Evaluation  07/13/17    Authorization Type  Medicare     PT Start Time  3785    PT Stop Time  1558    PT Time Calculation (min)  43 min    Activity Tolerance  Patient tolerated treatment well    Behavior During Therapy  [facility] for tasks assessed/performed       Past Medical History:  Diagnosis Date  . ANEMIA-NOS   . ANXIETY   . Breast cancer ([place]) 1997 L, 2012 R   s/p chemo/xrt  . COPD    resolved  . DIVERTICULOSIS, COLON 2008  . Dizziness   . GERD   . Hx of radiation therapy 10/19/11 -12/03/11   right breast  . HYPERLIPIDEMIA   . IRRITABLE BOWEL SYNDROME, HX OF   . Left-sided carotid artery disease (HCC)    moderate left ICA stenosis  . OSTEOARTHRITIS, HAND   . PSVT (paroxysmal supraventricular tachycardia) (HCC)    symptomatic on event monitor    Past Surgical History:  Procedure Laterality Date  . ABDOMINAL HYSTERECTOMY    . APPENDECTOMY    . BREAST BIOPSY  11/14/10    r breast: inv, insitu mammary carcinoma w/calcif, er/pr +, her2 -  . BREAST SURGERY     lumpectomy  . CATARACT EXTRACTION     both eyes  . ELECTROPHYSIOLOGIC STUDY N/A 05/10/2015   Procedure: SVT Ablation;  Surgeon: Will Meredith Leeds, MD;  Location: [facility];  Service: Cardiovascular;  Laterality: N/A;  . HERNIA REPAIR    . inguinal herniorrhapy  1984   left  . rectal fissure repair    . s/p benign breast biopsy  2003   right  . s/p left foot surgury  2009  . 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 spiral fx left foot  2008   no surgury  . TMJ ARTHROPLASTY  1989  . [place]- to remove scar tissue growth   . TONSILLECTOMY      There were no vitals filed for this visit.   Subjective Assessment - 06/01/17 1516    Subjective  Pt states increased pain in L shoulder, described as a constant ache in shoulder and upper arm. She reports numbness in thumb, intermittently in finger tips (when typing on computer).  No recent injury, Pt is retired, but is active, walks in park.      Limitations  Lifting;Walking;Writing;House hold activities    Currently in Pain?  Yes    Pain Score  2     Pain Location  Shoulder    Pain Orientation  Left    Pain Descriptors / Indicators  Aching;Dull    Pain Type  Chronic pain    Pain Onset  More than a month ago    Pain Frequency  Constant         [facility] PT Assessment - 06/02/17 0001      Assessment   Medical Diagnosis  L shoulder pain/ neck pain    Referring  Provider  Charlann Boxer      Precautions   Precautions  None      Restrictions   Weight Bearing Restrictions  No      Balance Screen   Has the patient fallen in the past 6 months  No      Prior Function   Level of Independence  Independent      Cognition   Overall Cognitive Status  Within Functional Limits for tasks assessed      Posture/Postural Control   Posture Comments  Forward head, rounded shoulders, Head rests in slight L SB      AROM   Overall AROM Comments  L shoulder: WFL  Cervical: rotation 60 deg bil; flexion: WFL;  ext: mod limitation;  SB: moderate/signfiicant limitation      PROM   Overall PROM Comments  L shoulder: Flex: 150;       Strength   Left Shoulder Flexion  4-/5    Left Shoulder ABduction  4-/5    Left Shoulder Internal Rotation  4/5    Left Shoulder External Rotation  4/5      Palpation   Palpation comment  Decreased cervical mobility, decreasd ability for side glides to L;        Special Tests   Other special tests  increased pain/weakness with empty can test;  negative cervical compression ;                  Objective measurements completed on examination: See above findings.      [facility] Adult PT Treatment/Exercise - 06/02/17 0001      Exercises   Exercises  Shoulder      Shoulder Exercises: Supine   Flexion  AAROM;10 reps    Flexion Limitations  cane      Shoulder Exercises: Seated   Retraction  10 reps    Other Seated Exercises  shoulder rolls, chin tucks,              PT Education - 06/01/17 1619    Education provided  Yes    Education Details  HEP    Person(s) Educated  Patient    Methods  Explanation    Comprehension  Verbalized understanding       PT Short Term Goals - 06/01/17 1621      PT SHORT TERM GOAL #1   Title  Pt to be independent with initial HEP     Time  2    Period  Weeks    Status  New    Target Date  06/15/17        PT Long Term Goals - 06/01/17 1622      PT LONG TERM GOAL #1   Title  Pt to report decreased pain in L shoulder and UE to 0-1/10 with activity     Time  6    Period  Weeks    Status  New    Target Date  07/13/17      PT LONG TERM GOAL #2   Title  Pt to demo increased strength of L shoulder to at least 4+/5 to improve ability for IADLs.     Time  6    Period  Weeks    Status  New    Target Date  07/13/17      PT LONG TERM GOAL #3   Title  Pt to demo improved cervical ROM to be [facility] for pt age    Time  57  Period  Weeks    Status  New    Target Date  07/13/17             Plan - 06/01/17 1625    Clinical Impression Statement  Pt presents with primary complaint of increased pain in L shoulder. She has mild ROM limitations in shoulder, due to muscle weakness. She has minimal pain in neck, but does have ROM limitations. She also has poor seated posture with forward head and rounded shoulders. She has She has decreased ability for full functional activities with UE. Pt to benefit from skilled PT to improve ROM, strength, and to teach effective HEP.     Clinical Presentation  Stable    Clinical  Decision Making  Low    Rehab Potential  Fair    PT Frequency  2x / week    PT Duration  6 weeks    PT Treatment/Interventions  ADLs/Self Care Home Management;Cryotherapy;Electrical Stimulation;Iontophoresis 29m/ml Dexamethasone;Functional mobility training;Ultrasound;Moist Heat;Therapeutic activities;Therapeutic exercise;Neuromuscular re-education;Patient/family education;Manual techniques;Dry needling;Taping;Vasopneumatic Device;Passive range of motion    PT Next Visit Plan  Teach HEP for shoulder        Patient will benefit from skilled therapeutic intervention in order to improve the following deficits and impairments:  Hypomobility, Pain, Decreased strength, Decreased activity tolerance, Increased muscle spasms, Improper body mechanics, Decreased range of motion, Impaired flexibility  Visit Diagnosis: Chronic left shoulder pain  Neck pain     Problem List Patient Active Problem List   Diagnosis Date Noted  . Cervical radiculitis 04/09/2017  . Left shoulder pain 04/09/2017  . Dyspnea on exertion 05/14/2016  . 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 Lymphedema 01/14/2016  . Left arm swelling 12/25/2015  . [place]) ([place]   . Left[place]sided carotid artery disease ([place] 03/08/2015  . Dizziness 01/24/2015  . Urinary frequency 01/13/2015  . Dizziness and giddiness 01/09/2015  . Gallstones 02/21/2013  . Breast cancer of upper-inner quadrant of right female breast ([place] 11/28/2012  . Muscle cramping 09/12/2012  . Right hip pain 09/12/2012  . Lower back pain 09/12/2012  . COPD GOLD I    . Hx of radiation therapy   . Right shoulder pain 09/14/2011  . Preventative health care 07/29/2010  . Skin lesion of left leg 07/29/2010  . Paresthesia 07/29/2010  . GERD 03/28/2010  . CONSTIPATION 03/28/2010  . Osteoarthrosis, 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  . Anxiety state 05/03/2009  . Hyperlipidemia 06/14/2007  . FATIGUE 06/14/2007  . 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 DIVERTICULOSIS, COLON 12/17/2006  . Cough 12/17/2006  . IRRITABLE BOWEL SYNDROME, HX OF 12/17/2006   Stacey [name] PT, DPT 9:30 AM  06/02/17    [facility] [address]Phone: [phone]  Fax:  [phone] Name: GPEGGYANN ZWIEFELHOFERMRN: [id number] of Birth: 221-Nov-1942

## 2017-06-03 ENCOUNTER — Ambulatory Visit (INDEPENDENT_AMBULATORY_CARE_PROVIDER_SITE_OTHER): Payer: Medicare Other | Admitting: Physical Therapy

## 2017-06-03 ENCOUNTER — Encounter: Payer: Self-pay | Admitting: Physical Therapy

## 2017-06-03 DIAGNOSIS — M542 Cervicalgia: Secondary | ICD-10-CM | POA: Diagnosis not present

## 2017-06-03 DIAGNOSIS — M25512 Pain in left shoulder: Secondary | ICD-10-CM | POA: Diagnosis not present

## 2017-06-03 DIAGNOSIS — G8929 Other chronic pain: Secondary | ICD-10-CM | POA: Diagnosis not present

## 2017-06-03 NOTE — Therapy (Signed)
[facility] [address] [place], [place], [address] Phone: [phone]   Fax:  [phone]  Physical Therapy Treatment  Patient Details  Name: Stacey [name] MRN: [id number] Date of Birth: [birth year] Referring Provider: Charlann Boxer   Encounter Date: 06/03/2017  PT End of Session - 06/03/17 1018    Visit Number  2    Number of Visits  12    Date for PT Re-Evaluation  07/13/17    Authorization Type  Medicare     PT Start Time  0930    PT Stop Time  1012    PT Time Calculation (min)  42 min    Activity Tolerance  Patient tolerated treatment well    Behavior During Therapy  [facility] for tasks assessed/performed       Past Medical History:  Diagnosis Date  . ANEMIA-NOS   . ANXIETY   . Breast cancer ([place]) 1997 L, 2012 R   s/p chemo/xrt  . COPD    resolved  . DIVERTICULOSIS, COLON 2008  . Dizziness   . GERD   . Hx of radiation therapy 10/19/11 -12/03/11   right breast  . HYPERLIPIDEMIA   . IRRITABLE BOWEL SYNDROME, HX OF   . Left-sided carotid artery disease (HCC)    moderate left ICA stenosis  . OSTEOARTHRITIS, HAND   . PSVT (paroxysmal supraventricular tachycardia) (HCC)    symptomatic on event monitor    Past Surgical History:  Procedure Laterality Date  . ABDOMINAL HYSTERECTOMY    . APPENDECTOMY    . BREAST BIOPSY  11/14/10    r breast: inv, insitu mammary carcinoma w/calcif, er/pr +, her2 -  . BREAST SURGERY     lumpectomy  . CATARACT EXTRACTION     both eyes  . ELECTROPHYSIOLOGIC STUDY N/A 05/10/2015   Procedure: SVT Ablation;  Surgeon: Will Meredith Leeds, MD;  Location: [facility];  Service: Cardiovascular;  Laterality: N/A;  . HERNIA REPAIR    . inguinal herniorrhapy  1984   left  . rectal fissure repair    . s/p benign breast biopsy  2003   right  . s/p left foot surgury  2009  . 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 spiral fx left foot  2008   no surgury  . TMJ ARTHROPLASTY  1989  . [place]- to remove scar tissue growth   . TONSILLECTOMY      There were no vitals filed for this visit.  Subjective Assessment - 06/03/17 0930    Subjective  Pt with  no new complaints today.     Limitations  Lifting;Walking;Writing;House hold activities    Currently in Pain?  Yes    Pain Score  2     Pain Location  Shoulder    Pain Orientation  Left    Pain Descriptors / Indicators  Aching    Pain Onset  More than a month ago    Pain Frequency  Constant                       [facility] Adult PT Treatment/Exercise - 06/03/17 0001      Shoulder Exercises: Supine   Flexion  AAROM;20 reps    Flexion Limitations  cane    Other Supine Exercises  Chest press AAROM 2x10      Shoulder Exercises: Seated   Retraction  10 reps    Other Seated  Exercises  shoulder rolls, chin tucks, x20 each    Other Seated Exercises  cervical rotation x10      Shoulder Exercises: Standing   External Rotation  20 reps;Theraband    Theraband Level (Shoulder External Rotation)  Level 2 (Red)    Internal Rotation  20 reps;Theraband    Theraband Level (Shoulder Internal Rotation)  Level 2 (Red)    Row  20 reps;Theraband    Theraband Level (Shoulder Row)  Level 2 (Red)    Other Standing Exercises  Wall slides 2 UE x15      Shoulder Exercises: Pulleys   Flexion  2 minutes      Manual Therapy   Manual Therapy  Passive ROM;Joint mobilization;Soft tissue mobilization    Joint Mobilization  light cervical PA mobs for pain,(gr 2);cervical side glides to R;   GHJ mobs (gr 3) inferior and posterior     Passive ROM  L shoulder, all motions,  Cervical rotation ; Upper trap stretches              PT Education - 06/03/17 0931    Education Details  HEP     Person(s) Educated  Patient    Methods  Explanation;Verbal cues;Handout    Comprehension  Verbalized understanding;Need further instruction       PT Short Term Goals - 06/01/17 1621      PT SHORT TERM GOAL #1   Title  Pt to  be independent with initial HEP     Time  2    Period  Weeks    Status  New    Target Date  06/15/17        PT Long Term Goals - 06/01/17 1622      PT LONG TERM GOAL #1   Title  Pt to report decreased pain in L shoulder and UE to 0-1/10 with activity     Time  6    Period  Weeks    Status  New    Target Date  07/13/17      PT LONG TERM GOAL #2   Title  Pt to demo increased strength of L shoulder to at least 4+/5 to improve ability for IADLs.     Time  6    Period  Weeks    Status  New    Target Date  07/13/17      PT LONG TERM GOAL #3   Title  Pt to demo improved cervical ROM to be [facility] for pt age    Time  6    Period  Weeks    Status  New    Target Date  07/13/17            Plan - 06/03/17 1019    Clinical Impression Statement  Pt able to perform shoulder ther ex today, for ROM and light strengthening, without increased pain. She was educated on activities to continue for HEP, new handout given. Close supervision used wtih standing activities, due to poor balance. plan to progress as tolerated.     Rehab Potential  Fair    PT Frequency  2x / week    PT Duration  6 weeks    PT Treatment/Interventions  ADLs/Self Care Home Management;Cryotherapy;Electrical Stimulation;Iontophoresis 75m/ml Dexamethasone;Functional mobility training;Ultrasound;Moist Heat;Therapeutic activities;Therapeutic exercise;Neuromuscular re-education;Patient/family education;Manual techniques;Dry needling;Taping;Vasopneumatic Device;Passive range of motion    PT Next Visit Plan  Teach HEP for shoulder        Patient will benefit from skilled therapeutic intervention in order  to improve the following deficits and impairments:  Hypomobility, Pain, Decreased strength, Decreased activity tolerance, Increased muscle spasms, Improper body mechanics, Decreased range of motion, Impaired flexibility  Visit Diagnosis: Chronic left shoulder pain  Neck pain     Problem List Patient Active Problem  List   Diagnosis Date Noted  . Cervical radiculitis 04/09/2017  . Left shoulder pain 04/09/2017  . Dyspnea on exertion 05/14/2016  . 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 Lymphedema 01/14/2016  . Left arm swelling 12/25/2015  . SVT (supraventricular tachycardia) ([place])   . Left-sided carotid artery disease ([place]) 03/08/2015  . Dizziness 01/24/2015  . Urinary frequency 01/13/2015  . Dizziness and giddiness 01/09/2015  . Gallstones 02/21/2013  . Breast cancer of upper-inner quadrant of right female breast ([place]) 11/28/2012  . Muscle cramping 09/12/2012  . Right hip pain 09/12/2012  . Lower back pain 09/12/2012  . COPD GOLD I    . Hx of radiation therapy   . Right shoulder pain 09/14/2011  . Preventative health care 07/29/2010  . Skin lesion of left leg 07/29/2010  . Paresthesia 07/29/2010  . GERD 03/28/2010  . CONSTIPATION 03/28/2010  . Osteoarthrosis, 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  . Anxiety state 05/03/2009  . Hyperlipidemia 06/14/2007  . FATIGUE 06/14/2007  . 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 DIVERTICULOSIS, COLON 12/17/2006  . Cough 12/17/2006  . IRRITABLE BOWEL SYNDROME, HX OF 12/17/2006   Lyndee Hensen, PT, DPT 10:22 AM  06/03/17    [facility] [place], [place], [address] Phone: [phone]   Fax:  [phone]  Name: LARAYNE BAXLEY MRN: [id number] Date of Birth: [birth year]

## 2017-06-08 ENCOUNTER — Encounter: Payer: Self-pay | Admitting: Physical Therapy

## 2017-06-08 ENCOUNTER — Ambulatory Visit (INDEPENDENT_AMBULATORY_CARE_PROVIDER_SITE_OTHER): Payer: Medicare Other | Admitting: Physical Therapy

## 2017-06-08 DIAGNOSIS — M25512 Pain in left shoulder: Secondary | ICD-10-CM | POA: Diagnosis not present

## 2017-06-08 DIAGNOSIS — G8929 Other chronic pain: Secondary | ICD-10-CM | POA: Diagnosis not present

## 2017-06-08 DIAGNOSIS — M542 Cervicalgia: Secondary | ICD-10-CM

## 2017-06-09 NOTE — Therapy (Signed)
[facility] [address] [place], [place], [address] Phone: [phone]   Fax:  [phone]  Physical Therapy Treatment  Patient Details  Name: Stacey [name] MRN: [id number] Date of Birth: September 21, 1940 Referring Provider: Charlann Boxer   Encounter Date: 06/08/2017  PT End of Session - 06/08/17 1310    Visit Number  3    Number of Visits  12    Date for PT Re-Evaluation  07/13/17    Authorization Type  Medicare     PT Start Time  1302    PT Stop Time  1345    PT Time Calculation (min)  43 min    Activity Tolerance  Patient tolerated treatment well    Behavior During Therapy  [facility] for tasks assessed/performed       Past Medical History:  Diagnosis Date  . ANEMIA-NOS   . ANXIETY   . Breast cancer ([place]) 1997 L, 2012 R   s/p chemo/xrt  . COPD    resolved  . DIVERTICULOSIS, COLON 2008  . Dizziness   . GERD   . Hx of radiation therapy 10/19/11 -12/03/11   right breast  . HYPERLIPIDEMIA   . IRRITABLE BOWEL SYNDROME, HX OF   . Left-sided carotid artery disease (HCC)    moderate left ICA stenosis  . OSTEOARTHRITIS, HAND   . PSVT (paroxysmal supraventricular tachycardia) (HCC)    symptomatic on event monitor    Past Surgical History:  Procedure Laterality Date  . ABDOMINAL HYSTERECTOMY    . APPENDECTOMY    . BREAST BIOPSY  11/14/10    r breast: inv, insitu mammary carcinoma w/calcif, er/pr +, her2 -  . BREAST SURGERY     lumpectomy  . CATARACT EXTRACTION     both eyes  . ELECTROPHYSIOLOGIC STUDY N/A 05/10/2015   Procedure: SVT Ablation;  Surgeon: Will Meredith Leeds, MD;  Location: [place] [facility];  Service: Cardiovascular;  Laterality: N/A;  . HERNIA REPAIR    . inguinal herniorrhapy  1984   left  . rectal fissure repair    . s/p benign breast biopsy  2003   right  . s/p left foot surgury  2009  . 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 spiral fx left foot  2008   no surgury  . TMJ ARTHROPLASTY  1989  . [place]- to remove scar tissue growth   . TONSILLECTOMY      There were no vitals filed for this visit.  Subjective Assessment - 06/08/17 1308    Subjective  Pt states that she does not feel well today, has diarrhea, thinks it is just from eating too much fruit, and requests to stay at appt. She notes soreness in her shoulder yesterday and today.     Currently in Pain?  Yes    Pain Score  3     Pain Location  Shoulder    Pain Orientation  Left    Pain Descriptors / Indicators  Aching    Pain Type  Chronic pain    Pain Onset  More than a month ago    Pain Frequency  Constant                       [facility] Adult PT Treatment/Exercise - 06/08/17 1310      Shoulder Exercises: Supine   Flexion  AAROM;20 reps    Flexion Limitations  cane  Other Supine Exercises  Chest press AAROM 2x10      Shoulder Exercises: Seated   Retraction  --    Other Seated Exercises  chin tucks, x20    Other Seated Exercises  cervical rotation x10      Shoulder Exercises: Sidelying   External Rotation  20 reps;Weights    External Rotation Weight (lbs)  1      Shoulder Exercises: Standing   External Rotation  20 reps;Theraband    Theraband Level (Shoulder External Rotation)  Level 2 (Red)    Internal Rotation  20 reps;Theraband    Theraband Level (Shoulder Internal Rotation)  Level 2 (Red)    Row  20 reps;Theraband    Theraband Level (Shoulder Row)  Level 2 (Red)    Other Standing Exercises  --      Shoulder Exercises: Pulleys   Flexion  2 minutes      Manual Therapy   Manual Therapy  Passive ROM;Joint mobilization;Soft tissue mobilization;Manual Traction    Joint Mobilization  cervical side glides to R;   GHJ mobs (gr 3) inferior and posterior     Soft tissue mobilization  STM to L UT     Passive ROM  L shoulder, all motions,  Upper trap stretches     Manual Traction  5 sec x5;              PT Education - 06/08/17 1310    Education provided  Yes    Education  Details  HEP    Person(s) Educated  Patient    Methods  Explanation    Comprehension  Verbalized understanding;Need further instruction       PT Short Term Goals - 06/01/17 1621      PT SHORT TERM GOAL #1   Title  Pt to be independent with initial HEP     Time  2    Period  Weeks    Status  New    Target Date  06/15/17        PT Long Term Goals - 06/01/17 1622      PT LONG TERM GOAL #1   Title  Pt to report decreased pain in L shoulder and UE to 0-1/10 with activity     Time  6    Period  Weeks    Status  New    Target Date  07/13/17      PT LONG TERM GOAL #2   Title  Pt to demo increased strength of L shoulder to at least 4+/5 to improve ability for IADLs.     Time  6    Period  Weeks    Status  New    Target Date  07/13/17      PT LONG TERM GOAL #3   Title  Pt to demo improved cervical ROM to be [facility] for pt age    Time  6    Period  Weeks    Status  New    Target Date  07/13/17            Plan - 06/09/17 1134    Clinical Impression Statement  Pt with noted tightenss and soreness at Left upper trap today, with STM. Light manual distraction for c-spine attempted today, she states increased diffiuclty breathing with this, says "its ok, you can continue", but this intervention will no longer be performed due to pt's statement. .Pt able to perform light strengthening for shoulder without increased pain today. Plan to progress  as tolerated.     Rehab Potential  Fair    PT Frequency  2x / week    PT Duration  6 weeks    PT Treatment/Interventions  ADLs/Self Care Home Management;Cryotherapy;Electrical Stimulation;Iontophoresis 3m/ml Dexamethasone;Functional mobility training;Ultrasound;Moist Heat;Therapeutic activities;Therapeutic exercise;Neuromuscular re-education;Patient/family education;Manual techniques;Dry needling;Taping;Vasopneumatic Device;Passive range of motion    PT Next Visit Plan  Teach HEP for shoulder        Patient will benefit from skilled  therapeutic intervention in order to improve the following deficits and impairments:  Hypomobility, Pain, Decreased strength, Decreased activity tolerance, Increased muscle spasms, Improper body mechanics, Decreased range of motion, Impaired flexibility  Visit Diagnosis: Chronic left shoulder pain  Neck pain     Problem List Patient Active Problem List   Diagnosis Date Noted  . Cervical radiculitis 04/09/2017  . Left shoulder pain 04/09/2017  . Dyspnea on exertion 05/14/2016  . 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 Lymphedema 01/14/2016  . Left arm swelling 12/25/2015  . SVT (supraventricular tachycardia) ([place]   . Left-sided carotid artery disease ([place] 03/08/2015  . Dizziness 01/24/2015  . Urinary frequency 01/13/2015  . Dizziness and giddiness 01/09/2015  . Gallstones 02/21/2013  . Breast cancer of upper-inner quadrant of right female breast ([place] 11/28/2012  . Muscle cramping 09/12/2012  . Right hip pain 09/12/2012  . Lower back pain 09/12/2012  . COPD GOLD I    . Hx of radiation therapy   . Right shoulder pain 09/14/2011  . Preventative health care 07/29/2010  . Skin lesion of left leg 07/29/2010  . Paresthesia 07/29/2010  . GERD 03/28/2010  . CONSTIPATION 03/28/2010  . Osteoarthrosis, 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  . Anxiety state 05/03/2009  . Hyperlipidemia 06/14/2007  . FATIGUE 06/14/2007  . 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 DIVERTICULOSIS, COLON 12/17/2006  . Cough 12/17/2006  . IRRITABLE BOWEL SYNDROME, HX OF 12/17/2006   LLyndee Hensen PT, DPT 11:49 AM  06/09/17    [facility] [place] [address]Phone: [phone]  Fax:  [phone] Name: GKAYRON KALMARMRN: [id number] of Birth: [birth year]

## 2017-06-10 ENCOUNTER — Encounter: Payer: Self-pay | Admitting: Physical Therapy

## 2017-06-10 ENCOUNTER — Other Ambulatory Visit: Payer: Self-pay | Admitting: Internal Medicine

## 2017-06-10 ENCOUNTER — Ambulatory Visit (INDEPENDENT_AMBULATORY_CARE_PROVIDER_SITE_OTHER): Payer: Medicare Other | Admitting: Physical Therapy

## 2017-06-10 DIAGNOSIS — M542 Cervicalgia: Secondary | ICD-10-CM

## 2017-06-10 DIAGNOSIS — G8929 Other chronic pain: Secondary | ICD-10-CM | POA: Diagnosis not present

## 2017-06-10 DIAGNOSIS — M25512 Pain in left shoulder: Secondary | ICD-10-CM

## 2017-06-10 NOTE — Therapy (Signed)
[facility] [address] [place], [place], [address] Phone: [phone]   Fax:  [phone]  Physical Therapy Treatment  Patient Details  Name: Stacey [name] MRN: [id number] Date of Birth: January 28, 1941 Referring Provider: Charlann Boxer   Encounter Date: 06/10/2017  PT End of Session - 06/10/17 1409    Visit Number  4    Number of Visits  12    Date for PT Re-Evaluation  07/13/17    Authorization Type  Medicare     PT Start Time  1300    PT Stop Time  1345    PT Time Calculation (min)  45 min    Activity Tolerance  Patient tolerated treatment well    Behavior During Therapy  [facility] for tasks assessed/performed       Past Medical History:  Diagnosis Date  . ANEMIA-NOS   . ANXIETY   . Breast cancer ([place]) 1997 L, 2012 R   s/p chemo/xrt  . COPD    resolved  . DIVERTICULOSIS, COLON 2008  . Dizziness   . GERD   . Hx of radiation therapy 10/19/11 -12/03/11   right breast  . HYPERLIPIDEMIA   . IRRITABLE BOWEL SYNDROME, HX OF   . Left-sided carotid artery disease (HCC)    moderate left ICA stenosis  . OSTEOARTHRITIS, HAND   . PSVT (paroxysmal supraventricular tachycardia) (HCC)    symptomatic on event monitor    Past Surgical History:  Procedure Laterality Date  . ABDOMINAL HYSTERECTOMY    . APPENDECTOMY    . BREAST BIOPSY  11/14/10    r breast: inv, insitu mammary carcinoma w/calcif, er/pr +, her2 -  . BREAST SURGERY     lumpectomy  . CATARACT EXTRACTION     both eyes  . ELECTROPHYSIOLOGIC STUDY N/A 05/10/2015   Procedure: SVT Ablation;  Surgeon: Will Meredith Leeds, MD;  Location: [place] [facility];  Service: Cardiovascular;  Laterality: N/A;  . HERNIA REPAIR    . inguinal herniorrhapy  1984   left  . rectal fissure repair    . s/p benign breast biopsy  2003   right  . s/p left foot surgury  2009  . 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 spiral fx left foot  2008   no surgury  . TMJ ARTHROPLASTY  1989  . [place]- to remove scar tissue growth   . TONSILLECTOMY      There were no vitals filed for this visit.  Subjective Assessment - 06/10/17 1300    Subjective  Pt states that she is feeling better today /stomach, shoulder is still sore. She notes incresed pain in upper arm when she does her daily walking     Currently in Pain?  Yes    Pain Score  3     Pain Location  Shoulder    Pain Orientation  Left    Pain Descriptors / Indicators  Aching    Pain Type  Chronic pain    Pain Onset  More than a month ago    Pain Frequency  Intermittent                       [facility] Adult PT Treatment/Exercise - 06/10/17 1301      Shoulder Exercises: Supine   Flexion  AAROM;20 reps    Flexion Limitations  cane, 2 lb    Other Supine Exercises  Chest press  AAROM 2x10, 2 lb    Other Supine Exercises  SA punch x20      Shoulder Exercises: Seated   Other Seated Exercises  chin tucks, x20    Other Seated Exercises  --      Shoulder Exercises: Sidelying   External Rotation  20 reps;Weights    External Rotation Weight (lbs)  1      Shoulder Exercises: Standing   External Rotation  20 reps;Theraband    Theraband Level (Shoulder External Rotation)  Level 2 (Red)    Internal Rotation  20 reps;Theraband    Theraband Level (Shoulder Internal Rotation)  Level 2 (Red)    Row  20 reps;Theraband    Theraband Level (Shoulder Row)  Level 2 (Red)    Other Standing Exercises  Wall slides 2 UE x15      Shoulder Exercises: Pulleys   Flexion  2 minutes      Manual Therapy   Manual Therapy  Passive ROM;Joint mobilization;Soft tissue mobilization;Manual Traction    Joint Mobilization  cervical side glides to R;  cervical PA mobs;  GHJ mobs (gr 3) inferior and posterior     Soft tissue mobilization  --    Passive ROM  L shoulder, all motions,  Upper trap stretches     Manual Traction  --               PT Short Term Goals - 06/01/17 1621      PT SHORT TERM GOAL #1   Title   Pt to be independent with initial HEP     Time  2    Period  Weeks    Status  New    Target Date  06/15/17        PT Long Term Goals - 06/01/17 1622      PT LONG TERM GOAL #1   Title  Pt to report decreased pain in L shoulder and UE to 0-1/10 with activity     Time  6    Period  Weeks    Status  New    Target Date  07/13/17      PT LONG TERM GOAL #2   Title  Pt to demo increased strength of L shoulder to at least 4+/5 to improve ability for IADLs.     Time  6    Period  Weeks    Status  New    Target Date  07/13/17      PT LONG TERM GOAL #3   Title  Pt to demo improved cervical ROM to be [facility] for pt age    Time  6    Period  Weeks    Status  New    Target Date  07/13/17            Plan - 06/10/17 1415    Clinical Impression Statement  Pt with minimal pain with activities today, but continues to state increased pain in arm . Discussed head posture and shoulder posture wtih her when she is walking, trying not to look down to feet for long duration,in attempts to improve neck pain. Plan to continue strengthening and ROM for neck and shoulder as tolerated.     Rehab Potential  Fair    PT Frequency  2x / week    PT Duration  6 weeks    PT Treatment/Interventions  ADLs/Self Care Home Management;Cryotherapy;Electrical Stimulation;Iontophoresis 13m/ml Dexamethasone;Functional mobility training;Ultrasound;Moist Heat;Therapeutic activities;Therapeutic exercise;Neuromuscular re-education;Patient/family education;Manual techniques;Dry needling;Taping;Vasopneumatic Device;Passive range of motion  PT Next Visit Plan  Teach HEP for shoulder        Patient will benefit from skilled therapeutic intervention in order to improve the following deficits and impairments:  Hypomobility, Pain, Decreased strength, Decreased activity tolerance, Increased muscle spasms, Improper body mechanics, Decreased range of motion, Impaired flexibility  Visit Diagnosis: Chronic left shoulder  pain  Neck pain     Problem List Patient Active Problem List   Diagnosis Date Noted  . Cervical radiculitis 04/09/2017  . Left shoulder pain 04/09/2017  . Dyspnea on exertion 05/14/2016  . 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 Lymphedema 01/14/2016  . Left arm swelling 12/25/2015  . SVT (supraventricular tachycardia) ([place])   . Left-sided carotid artery disease ([place]) 03/08/2015  . Dizziness 01/24/2015  . Urinary frequency 01/13/2015  . Dizziness and giddiness 01/09/2015  . Gallstones 02/21/2013  . Breast cancer of upper-inner quadrant of right female breast ([place]) 11/28/2012  . Muscle cramping 09/12/2012  . Right hip pain 09/12/2012  . Lower back pain 09/12/2012  . COPD GOLD I    . Hx of radiation therapy   . Right shoulder pain 09/14/2011  . Preventative health care 07/29/2010  . Skin lesion of left leg 07/29/2010  . Paresthesia 07/29/2010  . GERD 03/28/2010  . CONSTIPATION 03/28/2010  . Osteoarthrosis, 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  . Anxiety state 05/03/2009  . Hyperlipidemia 06/14/2007  . FATIGUE 06/14/2007  . 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 DIVERTICULOSIS, COLON 12/17/2006  . Cough 12/17/2006  . IRRITABLE BOWEL SYNDROME, HX OF 12/17/2006    Lyndee Hensen, PT, DPT 2:18 PM  06/10/17    [facility] [place], [place], [address] Phone: [phone]   Fax:  [phone]  Name: ROBINETTE ESTERS MRN: [id number] Date of Birth: 29-Aug-1940

## 2017-06-15 ENCOUNTER — Ambulatory Visit (INDEPENDENT_AMBULATORY_CARE_PROVIDER_SITE_OTHER): Payer: Medicare Other | Admitting: Physical Therapy

## 2017-06-15 ENCOUNTER — Encounter: Payer: Self-pay | Admitting: Physical Therapy

## 2017-06-15 DIAGNOSIS — G8929 Other chronic pain: Secondary | ICD-10-CM | POA: Diagnosis not present

## 2017-06-15 DIAGNOSIS — M25512 Pain in left shoulder: Secondary | ICD-10-CM | POA: Diagnosis not present

## 2017-06-15 DIAGNOSIS — M542 Cervicalgia: Secondary | ICD-10-CM

## 2017-06-16 NOTE — Therapy (Signed)
[facility] [address] [place], [place], [address] Phone: [phone]   Fax:  [phone]  Physical Therapy Treatment  Patient Details  Name: Stacey [name] MRN: [id number] Date of Birth: 1940-02-28 Referring Provider: Charlann Boxer   Encounter Date: 06/15/2017  PT End of Session - 06/15/17 1312    Visit Number  5    Number of Visits  12    Date for PT Re-Evaluation  07/13/17    Authorization Type  Medicare     PT Start Time  3570    PT Stop Time  1345    PT Time Calculation (min)  39 min    Activity Tolerance  Patient tolerated treatment well    Behavior During Therapy  [facility] for tasks assessed/performed       Past Medical History:  Diagnosis Date  . ANEMIA-NOS   . ANXIETY   . Breast cancer ([place]) 1997 L, 2012 R   s/p chemo/xrt  . COPD    resolved  . DIVERTICULOSIS, COLON 2008  . Dizziness   . GERD   . Hx of radiation therapy 10/19/11 -12/03/11   right breast  . HYPERLIPIDEMIA   . IRRITABLE BOWEL SYNDROME, HX OF   . Left-sided carotid artery disease (HCC)    moderate left ICA stenosis  . OSTEOARTHRITIS, HAND   . PSVT (paroxysmal supraventricular tachycardia) (HCC)    symptomatic on event monitor    Past Surgical History:  Procedure Laterality Date  . ABDOMINAL HYSTERECTOMY    . APPENDECTOMY    . BREAST BIOPSY  11/14/10    r breast: inv, insitu mammary carcinoma w/calcif, er/pr +, her2 -  . BREAST SURGERY     lumpectomy  . CATARACT EXTRACTION     both eyes  . ELECTROPHYSIOLOGIC STUDY N/A 05/10/2015   Procedure: SVT Ablation;  Surgeon: Will Meredith Leeds, MD;  Location: [place] [facility];  Service: Cardiovascular;  Laterality: N/A;  . HERNIA REPAIR    . inguinal herniorrhapy  1984   left  . rectal fissure repair    . s/p benign breast biopsy  2003   right  . s/p left foot surgury  2009  . 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 spiral fx left foot  2008   no surgury  . TMJ ARTHROPLASTY  1989  . [place]- to remove scar tissue growth   . TONSILLECTOMY      There were no vitals filed for this visit.  Subjective Assessment - 06/15/17 1310    Subjective  Pt states that she has not noticed much improvement in her arm. She also states that she is spending quite a while reading and looking down during the day.     Currently in Pain?  Yes    Pain Score  3     Pain Location  Shoulder    Pain Orientation  Left    Pain Descriptors / Indicators  Aching    Pain Type  Chronic pain    Pain Onset  More than a month ago    Pain Frequency  Intermittent                       [facility] Adult PT Treatment/Exercise - 06/15/17 1313      Shoulder Exercises: Supine   Flexion  AAROM;20 reps    Flexion Limitations  cane, 2 lb    Other Supine Exercises  Chest press AAROM 2x10, 2 lb    Other Supine Exercises  --      Shoulder Exercises: Seated   Other Seated Exercises  chin tucks, x20      Shoulder Exercises: Sidelying   External Rotation  --    External Rotation Weight (lbs)  --      Shoulder Exercises: Standing   External Rotation  20 reps;Theraband    Theraband Level (Shoulder External Rotation)  Level 2 (Red)    Internal Rotation  20 reps;Theraband    Theraband Level (Shoulder Internal Rotation)  Level 2 (Red)    Row  20 reps;Theraband    Theraband Level (Shoulder Row)  Level 2 (Red)    Other Standing Exercises  Wall slides 2 UE x15      Shoulder Exercises: Pulleys   Flexion  2 minutes      Manual Therapy   Manual Therapy  Passive ROM;Joint mobilization;Soft tissue mobilization;Manual Traction    Joint Mobilization  cervical side glides to R;  cervical PA mobs;     Soft tissue mobilization  STM to L UT     Passive ROM  L shoulder, all motions,  Upper trap stretches                PT Short Term Goals - 06/01/17 1621      PT SHORT TERM GOAL #1   Title  Pt to be independent with initial HEP     Time  2    Period  Weeks    Status  New    Target  Date  06/15/17        PT Long Term Goals - 06/01/17 1622      PT LONG TERM GOAL #1   Title  Pt to report decreased pain in L shoulder and UE to 0-1/10 with activity     Time  6    Period  Weeks    Status  New    Target Date  07/13/17      PT LONG TERM GOAL #2   Title  Pt to demo increased strength of L shoulder to at least 4+/5 to improve ability for IADLs.     Time  6    Period  Weeks    Status  New    Target Date  07/13/17      PT LONG TERM GOAL #3   Title  Pt to demo improved cervical ROM to be [facility] for pt age    Time  6    Period  Weeks    Status  New    Target Date  07/13/17            Plan - 06/16/17 1041    Clinical Impression Statement  Pt with improving ability for ther ex,no pain in shoulder with strengthening exercises. Pt states discomfort with attempts for manual therapy in cervical region. Discussed altering reading position, pt states that she reads for long periods of time, wiht head/neck in full flexion, which may be contributing to pain. Plan to continue as tolerated.     Rehab Potential  Fair    PT Frequency  2x / week    PT Duration  6 weeks    PT Treatment/Interventions  ADLs/Self Care Home Management;Cryotherapy;Electrical Stimulation;Iontophoresis 31m/ml Dexamethasone;Functional mobility training;Ultrasound;Moist Heat;Therapeutic activities;Therapeutic exercise;Neuromuscular re-education;Patient/family education;Manual techniques;Dry needling;Taping;Vasopneumatic Device;Passive range of motion    PT Next Visit Plan  Teach HEP for shoulder        Patient will benefit  from skilled therapeutic intervention in order to improve the following deficits and impairments:  Hypomobility, Pain, Decreased strength, Decreased activity tolerance, Increased muscle spasms, Improper body mechanics, Decreased range of motion, Impaired flexibility  Visit Diagnosis: Chronic left shoulder pain  Neck pain     Problem List Patient Active Problem List   Diagnosis  Date Noted  . Cervical radiculitis 04/09/2017  . Left shoulder pain 04/09/2017  . Dyspnea on exertion 05/14/2016  . 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 Lymphedema 01/14/2016  . Left arm swelling 12/25/2015  . SVT (supraventricular tachycardia) ([place])   . Left-sided carotid artery disease ([place]) 03/08/2015  . Dizziness 01/24/2015  . Urinary frequency 01/13/2015  . Dizziness and giddiness 01/09/2015  . Gallstones 02/21/2013  . Breast cancer of upper-inner quadrant of right female breast ([place]) 11/28/2012  . Muscle cramping 09/12/2012  . Right hip pain 09/12/2012  . Lower back pain 09/12/2012  . COPD GOLD I    . Hx of radiation therapy   . Right shoulder pain 09/14/2011  . Preventative health care 07/29/2010  . Skin lesion of left leg 07/29/2010  . Paresthesia 07/29/2010  . GERD 03/28/2010  . CONSTIPATION 03/28/2010  . Osteoarthrosis, 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  . Anxiety state 05/03/2009  . Hyperlipidemia 06/14/2007  . FATIGUE 06/14/2007  . 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 DIVERTICULOSIS, COLON 12/17/2006  . Cough 12/17/2006  . IRRITABLE BOWEL SYNDROME, HX OF 12/17/2006    Lyndee Hensen, PT, DPT 10:49 AM  06/16/17    [facility] [address] [place], [place], [address] Phone: [phone]   Fax:  [phone]  Name: OLIWIA BERZINS MRN: [id number] Date of Birth: Apr 25, 1940

## 2017-06-17 ENCOUNTER — Other Ambulatory Visit: Payer: Self-pay | Admitting: Internal Medicine

## 2017-06-17 ENCOUNTER — Ambulatory Visit (INDEPENDENT_AMBULATORY_CARE_PROVIDER_SITE_OTHER): Payer: Medicare Other | Admitting: Physical Therapy

## 2017-06-17 DIAGNOSIS — G8929 Other chronic pain: Secondary | ICD-10-CM

## 2017-06-17 DIAGNOSIS — M542 Cervicalgia: Secondary | ICD-10-CM | POA: Diagnosis not present

## 2017-06-17 DIAGNOSIS — M25512 Pain in left shoulder: Secondary | ICD-10-CM

## 2017-06-17 NOTE — Therapy (Signed)
[facility] [address] [place], [place], [address] Phone: [phone]   Fax:  [phone]  Physical Therapy Treatment  Patient Details  Name: Stacey [name] MRN: [id number] Date of Birth: March 18, 1940 Referring Provider: Charlann Boxer   Encounter Date: 06/17/2017  PT End of Session - 06/17/17 1333    Visit Number  6    Number of Visits  12    Date for PT Re-Evaluation  07/13/17    Authorization Type  Medicare     PT Start Time  6659    PT Stop Time  1347    PT Time Calculation (min)  42 min    Activity Tolerance  Patient tolerated treatment well    Behavior During Therapy  [facility] for tasks assessed/performed       Past Medical History:  Diagnosis Date  . ANEMIA-NOS   . ANXIETY   . Breast cancer ([place]) 1997 L, 2012 R   s/p chemo/xrt  . COPD    resolved  . DIVERTICULOSIS, COLON 2008  . Dizziness   . GERD   . Hx of radiation therapy 10/19/11 -12/03/11   right breast  . HYPERLIPIDEMIA   . IRRITABLE BOWEL SYNDROME, HX OF   . Left-sided carotid artery disease (HCC)    moderate left ICA stenosis  . OSTEOARTHRITIS, HAND   . PSVT (paroxysmal supraventricular tachycardia) (HCC)    symptomatic on event monitor    Past Surgical History:  Procedure Laterality Date  . ABDOMINAL HYSTERECTOMY    . APPENDECTOMY    . BREAST BIOPSY  11/14/10    r breast: inv, insitu mammary carcinoma w/calcif, er/pr +, her2 -  . BREAST SURGERY     lumpectomy  . CATARACT EXTRACTION     both eyes  . ELECTROPHYSIOLOGIC STUDY N/A 05/10/2015   Procedure: SVT Ablation;  Surgeon: Will Meredith Leeds, MD;  Location: [place] [facility];  Service: Cardiovascular;  Laterality: N/A;  . HERNIA REPAIR    . inguinal herniorrhapy  1984   left  . rectal fissure repair    . s/p benign breast biopsy  2003   right  . s/p left foot surgury  2009  . 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 spiral fx left foot  2008   no surgury  . TMJ ARTHROPLASTY  1989  . [place]- to remove scar tissue growth   . TONSILLECTOMY      There were no vitals filed for this visit.  Subjective Assessment - 06/17/17 1328    Subjective  Pt states minimal soreness in shoulder today. She had dental procedure yesterday, states complication for 1-2 hours following, states novicane went into her eye, effecting her vision, and her balance, stayed at dentist office for 1-2 hours until symptoms subsided. No symptoms today.     Currently in Pain?  Yes    Pain Score  2     Pain Location  Shoulder    Pain Orientation  Left    Pain Descriptors / Indicators  Aching    Pain Type  Chronic pain    Pain Onset  More than a month ago    Pain Frequency  Intermittent                       [facility] Adult PT Treatment/Exercise - 06/17/17 1323      Shoulder Exercises: Supine   Flexion  AAROM;20 reps  Flexion Limitations  cane, 2 lb    Other Supine Exercises  Chest press AAROM 2x10, 2 lb      Shoulder Exercises: Seated   Other Seated Exercises  --      Shoulder Exercises: Standing   External Rotation  20 reps;Theraband    Theraband Level (Shoulder External Rotation)  Level 2 (Red)    Internal Rotation  20 reps;Theraband    Theraband Level (Shoulder Internal Rotation)  Level 2 (Red)    Row  20 reps;Theraband    Theraband Level (Shoulder Row)  Level 2 (Red)    Other Standing Exercises  Wall slides 2 UE x20      Shoulder Exercises: Pulleys   Flexion  2 minutes      Manual Therapy   Manual Therapy  Passive ROM;Joint mobilization;Soft tissue mobilization;Manual Traction    Joint Mobilization  cervical side glides to R;  cervical PA mobs;     Soft tissue mobilization  --    Passive ROM  L shoulder, all motions,  Upper trap stretches                PT Short Term Goals - 06/01/17 1621      PT SHORT TERM GOAL #1   Title  Pt to be independent with initial HEP     Time  2    Period  Weeks    Status  New    Target Date  06/15/17        PT  Long Term Goals - 06/01/17 1622      PT LONG TERM GOAL #1   Title  Pt to report decreased pain in L shoulder and UE to 0-1/10 with activity     Time  6    Period  Weeks    Status  New    Target Date  07/13/17      PT LONG TERM GOAL #2   Title  Pt to demo increased strength of L shoulder to at least 4+/5 to improve ability for IADLs.     Time  6    Period  Weeks    Status  New    Target Date  07/13/17      PT LONG TERM GOAL #3   Title  Pt to demo improved cervical ROM to be [facility] for pt age    Time  8    Period  Weeks    Status  New    Target Date  07/13/17            Plan - 06/17/17 2214    Clinical Impression Statement  Pt with discomfort in neck with attempts for manual therapy, able to perform light stretching for UT, but unable to perform mobilizations or distraction due to pt comfort. Focus therefore has been on shoulder ROm and strength. Pt improving wtih ability for strengthening of L shouler. She continues to state that walking increases L shoulder /arm pain. Plan to see pt for 1-2 more visits, likely d/c to HEP at that time.     Rehab Potential  Fair    PT Frequency  2x / week    PT Duration  6 weeks    PT Treatment/Interventions  ADLs/Self Care Home Management;Cryotherapy;Electrical Stimulation;Iontophoresis 19m/ml Dexamethasone;Functional mobility training;Ultrasound;Moist Heat;Therapeutic activities;Therapeutic exercise;Neuromuscular re-education;Patient/family education;Manual techniques;Dry needling;Taping;Vasopneumatic Device;Passive range of motion    PT Next Visit Plan  Teach HEP for shoulder        Patient will benefit from skilled therapeutic intervention in order  to improve the following deficits and impairments:  Hypomobility, Pain, Decreased strength, Decreased activity tolerance, Increased muscle spasms, Improper body mechanics, Decreased range of motion, Impaired flexibility  Visit Diagnosis: Chronic left shoulder pain  Neck pain     Problem  List Patient Active Problem List   Diagnosis Date Noted  . Cervical radiculitis 04/09/2017  . Left shoulder pain 04/09/2017  . Dyspnea on exertion 05/14/2016  . 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 Lymphedema 01/14/2016  . Left arm swelling 12/25/2015  . SVT (supraventricular tachycardia) ([place])   . Left-sided carotid artery disease ([place]) 03/08/2015  . Dizziness 01/24/2015  . Urinary frequency 01/13/2015  . Dizziness and giddiness 01/09/2015  . Gallstones 02/21/2013  . Breast cancer of upper-inner quadrant of right female breast ([place]) 11/28/2012  . Muscle cramping 09/12/2012  . Right hip pain 09/12/2012  . Lower back pain 09/12/2012  . COPD GOLD I    . Hx of radiation therapy   . Right shoulder pain 09/14/2011  . Preventative health care 07/29/2010  . Skin lesion of left leg 07/29/2010  . Paresthesia 07/29/2010  . GERD 03/28/2010  . CONSTIPATION 03/28/2010  . Osteoarthrosis, 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  . Anxiety state 05/03/2009  . Hyperlipidemia 06/14/2007  . FATIGUE 06/14/2007  . 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 DIVERTICULOSIS, COLON 12/17/2006  . Cough 12/17/2006  . IRRITABLE BOWEL SYNDROME, HX OF 12/17/2006    Lyndee Hensen, PT, DPT 10:19 PM  06/17/17   [facility] [address] [place], [place], [address] Phone: [phone]   Fax:  [phone]  Name: Stacey [name] MRN: [id number] Date of Birth: [birth year]

## 2017-06-17 NOTE — Telephone Encounter (Signed)
Done erx 

## 2017-06-17 NOTE — Telephone Encounter (Signed)
01/31/2017 90# 

## 2017-06-21 ENCOUNTER — Ambulatory Visit (INDEPENDENT_AMBULATORY_CARE_PROVIDER_SITE_OTHER): Payer: Medicare Other | Admitting: Physical Therapy

## 2017-06-21 DIAGNOSIS — G8929 Other chronic pain: Secondary | ICD-10-CM | POA: Diagnosis not present

## 2017-06-21 DIAGNOSIS — M25512 Pain in left shoulder: Secondary | ICD-10-CM | POA: Diagnosis not present

## 2017-06-21 DIAGNOSIS — M542 Cervicalgia: Secondary | ICD-10-CM | POA: Diagnosis not present

## 2017-06-21 NOTE — Therapy (Signed)
[facility] [address] [place], [place], [address] Phone: [phone]   Fax:  [phone]  Physical Therapy Treatment  Patient Details  Name: Stacey [name] MRN: [id number] Date of Birth: 1940-03-04 Referring Provider: Charlann Boxer   Encounter Date: 06/21/2017  PT End of Session - 06/21/17 1541    Visit Number  7    Number of Visits  12    Date for PT Re-Evaluation  07/13/17    Authorization Type  Medicare     PT Start Time  1310    PT Stop Time  1348    PT Time Calculation (min)  38 min    Activity Tolerance  Patient tolerated treatment well    Behavior During Therapy  [facility] for tasks assessed/performed       Past Medical History:  Diagnosis Date  . ANEMIA-NOS   . ANXIETY   . Breast cancer ([place]) 1997 L, 2012 R   s/p chemo/xrt  . COPD    resolved  . DIVERTICULOSIS, COLON 2008  . Dizziness   . GERD   . Hx of radiation therapy 10/19/11 -12/03/11   right breast  . HYPERLIPIDEMIA   . IRRITABLE BOWEL SYNDROME, HX OF   . Left-sided carotid artery disease (HCC)    moderate left ICA stenosis  . OSTEOARTHRITIS, HAND   . PSVT (paroxysmal supraventricular tachycardia) (HCC)    symptomatic on event monitor    Past Surgical History:  Procedure Laterality Date  . ABDOMINAL HYSTERECTOMY    . APPENDECTOMY    . BREAST BIOPSY  11/14/10    r breast: inv, insitu mammary carcinoma w/calcif, er/pr +, her2 -  . BREAST SURGERY     lumpectomy  . CATARACT EXTRACTION     both eyes  . ELECTROPHYSIOLOGIC STUDY N/A 05/10/2015   Procedure: SVT Ablation;  Surgeon: Will Meredith Leeds, MD;  Location: [place] [facility];  Service: Cardiovascular;  Laterality: N/A;  . HERNIA REPAIR    . inguinal herniorrhapy  1984   left  . rectal fissure repair    . s/p benign breast biopsy  2003   right  . s/p left foot surgury  2009  . 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 spiral fx left foot  2008   no surgury  . TMJ ARTHROPLASTY  1989  . [place]- to remove scar tissue growth   . TONSILLECTOMY      There were no vitals filed for this visit.  Subjective Assessment - 06/21/17 1536    Subjective  Pt states that shoulder/arm pain has been better in last few days. She has also changed position when she is sitting /reading, so that she is not looking down.     Currently in Pain?  No/denies    Pain Score  0-No pain                       [facility] Adult PT Treatment/Exercise - 06/21/17 1335      Shoulder Exercises: Supine   Horizontal ABduction  15 reps    Flexion  AAROM;20 reps    Flexion Limitations  cane, 2 lb    Other Supine Exercises  SA punch - cane x20      Shoulder Exercises: Sidelying   External Rotation  20 reps;Weights    External Rotation Weight (lbs)  1      Shoulder Exercises: Standing   External  Rotation  20 reps;Theraband    Theraband Level (Shoulder External Rotation)  Level 2 (Red)    Internal Rotation  20 reps;Theraband    Theraband Level (Shoulder Internal Rotation)  Level 2 (Red)    Row  20 reps;Theraband    Theraband Level (Shoulder Row)  Level 2 (Red)    Other Standing Exercises  Wall slides1 UE x20      Shoulder Exercises: Pulleys   Flexion  2 minutes      Shoulder Exercises: Stretch   Corner Stretch  3 reps;30 seconds      Manual Therapy   Manual Therapy  --    Joint Mobilization  --    Passive ROM  --               PT Short Term Goals - 06/01/17 1621      PT SHORT TERM GOAL #1   Title  Pt to be independent with initial HEP     Time  2    Period  Weeks    Status  New    Target Date  06/15/17        PT Long Term Goals - 06/01/17 1622      PT LONG TERM GOAL #1   Title  Pt to report decreased pain in L shoulder and UE to 0-1/10 with activity     Time  6    Period  Weeks    Status  New    Target Date  07/13/17      PT LONG TERM GOAL #2   Title  Pt to demo increased strength of L shoulder to at least 4+/5 to improve ability for IADLs.      Time  6    Period  Weeks    Status  New    Target Date  07/13/17      PT LONG TERM GOAL #3   Title  Pt to demo improved cervical ROM to be [facility] for pt age    Time  6    Period  Weeks    Status  New    Target Date  07/13/17            Plan - 06/21/17 1541    Clinical Impression Statement  Pt with minimal soreness with activities today. She continues to require max cuing to perform exercises correctly and with proper form. She will require further instruction on HEP. Plan to progress strengthening, and work towards d/c.     Rehab Potential  Fair    PT Frequency  2x / week    PT Duration  6 weeks    PT Treatment/Interventions  ADLs/Self Care Home Management;Cryotherapy;Electrical Stimulation;Iontophoresis 72m/ml Dexamethasone;Functional mobility training;Ultrasound;Moist Heat;Therapeutic activities;Therapeutic exercise;Neuromuscular re-education;Patient/family education;Manual techniques;Dry needling;Taping;Vasopneumatic Device;Passive range of motion    PT Next Visit Plan  Teach HEP for shoulder        Patient will benefit from skilled therapeutic intervention in order to improve the following deficits and impairments:  Hypomobility, Pain, Decreased strength, Decreased activity tolerance, Increased muscle spasms, Improper body mechanics, Decreased range of motion, Impaired flexibility  Visit Diagnosis: Chronic left shoulder pain  Neck pain     Problem List Patient Active Problem List   Diagnosis Date Noted  . Cervical radiculitis 04/09/2017  . Left shoulder pain 04/09/2017  . Dyspnea on exertion 05/14/2016  . 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 Lymphedema 01/14/2016  . Left arm swelling 12/25/2015  . SVT (supraventricular tachycardia) (  [place])   . Left-sided carotid artery disease ([place]) 03/08/2015  . Dizziness 01/24/2015  . Urinary frequency 01/13/2015  . Dizziness and giddiness 01/09/2015  . Gallstones 02/21/2013  . Breast cancer of  upper-inner quadrant of right female breast ([place]) 11/28/2012  . Muscle cramping 09/12/2012  . Right hip pain 09/12/2012  . Lower back pain 09/12/2012  . COPD GOLD I    . Hx of radiation therapy   . Right shoulder pain 09/14/2011  . Preventative health care 07/29/2010  . Skin lesion of left leg 07/29/2010  . Paresthesia 07/29/2010  . GERD 03/28/2010  . CONSTIPATION 03/28/2010  . Osteoarthrosis, 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  . Anxiety state 05/03/2009  . Hyperlipidemia 06/14/2007  . FATIGUE 06/14/2007  . 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 DIVERTICULOSIS, COLON 12/17/2006  . Cough 12/17/2006  . IRRITABLE BOWEL SYNDROME, HX OF 12/17/2006    Lyndee Hensen, PT, DPT 3:45 PM  06/21/17    [place], [place], [address] Phone: [phone]   Fax:  [phone]  Name: Stacey [name] MRN: [id number] Date of Birth: [birth year]

## 2017-06-29 ENCOUNTER — Ambulatory Visit (INDEPENDENT_AMBULATORY_CARE_PROVIDER_SITE_OTHER): Payer: Medicare Other | Admitting: Physical Therapy

## 2017-06-29 ENCOUNTER — Encounter: Payer: Self-pay | Admitting: Physical Therapy

## 2017-06-29 DIAGNOSIS — G8929 Other chronic pain: Secondary | ICD-10-CM | POA: Diagnosis not present

## 2017-06-29 DIAGNOSIS — M25512 Pain in left shoulder: Secondary | ICD-10-CM | POA: Diagnosis not present

## 2017-06-29 DIAGNOSIS — M542 Cervicalgia: Secondary | ICD-10-CM | POA: Diagnosis not present

## 2017-06-29 NOTE — Therapy (Signed)
[facility] [address] [place], [place], [address] Phone: [phone]   Fax:  [phone]  Physical Therapy Treatment/Discharge   Patient Details  Name: Stacey [name] MRN: [id number] Date of Birth: [birth year] Referring Provider: Charlann Boxer   Encounter Date: 06/29/2017  PT End of Session - 06/29/17 1315    Visit Number  8    Number of Visits  12    Date for PT Re-Evaluation  07/13/17    Authorization Type  Medicare     PT Start Time  1308    PT Stop Time  1355    PT Time Calculation (min)  47 min    Activity Tolerance  Patient tolerated treatment well    Behavior During Therapy  [facility] for tasks assessed/performed       Past Medical History:  Diagnosis Date  . ANEMIA-NOS   . ANXIETY   . Breast cancer ([place]) 1997 L, 2012 R   s/p chemo/xrt  . COPD    resolved  . DIVERTICULOSIS, COLON 2008  . Dizziness   . GERD   . Hx of radiation therapy 10/19/11 -12/03/11   right breast  . HYPERLIPIDEMIA   . IRRITABLE BOWEL SYNDROME, HX OF   . Left-sided carotid artery disease (HCC)    moderate left ICA stenosis  . OSTEOARTHRITIS, HAND   . PSVT (paroxysmal supraventricular tachycardia) (HCC)    symptomatic on event monitor    Past Surgical History:  Procedure Laterality Date  . ABDOMINAL HYSTERECTOMY    . APPENDECTOMY    . BREAST BIOPSY  11/14/10    r breast: inv, insitu mammary carcinoma w/calcif, er/pr +, her2 -  . BREAST SURGERY     lumpectomy  . CATARACT EXTRACTION     both eyes  . ELECTROPHYSIOLOGIC STUDY N/A 05/10/2015   Procedure: SVT Ablation;  Surgeon: Will Meredith Leeds, MD;  Location: [facility];  Service: Cardiovascular;  Laterality: N/A;  . HERNIA REPAIR    . inguinal herniorrhapy  1984   left  . rectal fissure repair    . s/p benign breast biopsy  2003   right  . s/p left foot surgury  2009  . 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 spiral fx left foot  2008   no surgury  . TMJ ARTHROPLASTY  1989   . [place]- to remove scar tissue growth   . TONSILLECTOMY      There were no vitals filed for this visit.  Subjective Assessment - 06/29/17 1314    Subjective  Pt states that neck and shoulder pain is much better, has not had any. She states pain in L arm (deltoid region) when she is walking, but has only happened 1x this week, was happening every day.     Currently in Pain?  No/denies    Pain Score  0-No pain         [facility] PT Assessment - 06/29/17 0001      AROM   Overall AROM Comments  L shoulder: WFL;  cervical: WFL , mild limitation for R rotation      Strength   Left Shoulder Flexion  4+/5    Left Shoulder ABduction  4+/5    Left Shoulder Internal Rotation  4+/5    Left Shoulder External Rotation  4+/5                   [facility] Adult PT  Treatment/Exercise - 06/29/17 1316      Shoulder Exercises: Supine   Horizontal ABduction  --    Flexion  AAROM;20 reps    Flexion Limitations  --    Other Supine Exercises  Chest press AAROM 2x10, 2 lb    Other Supine Exercises  --      Shoulder Exercises: Sidelying   External Rotation  --    External Rotation Weight (lbs)  --      Shoulder Exercises: Standing   External Rotation  20 reps;Theraband    Theraband Level (Shoulder External Rotation)  Level 2 (Red)    Internal Rotation  20 reps;Theraband    Theraband Level (Shoulder Internal Rotation)  Level 2 (Red)    Row  20 reps;Theraband    Theraband Level (Shoulder Row)  Level 2 (Red)    Other Standing Exercises  Wall slides1 UE x20      Shoulder Exercises: Pulleys   Flexion  2 minutes      Shoulder Exercises: Stretch   Corner Stretch  3 reps;30 seconds             PT Education - 06/29/17 1315    Education provided  Yes    Education Details  HEP    Person(s) Educated  Patient    Methods  Explanation;Verbal cues    Comprehension  Verbalized understanding;Verbal cues required       PT Short Term Goals - 06/29/17 1416      PT  SHORT TERM GOAL #1   Title  Pt to be independent with initial HEP     Time  2    Period  Weeks    Status  Achieved        PT Long Term Goals - 06/29/17 1416      PT LONG TERM GOAL #1   Title  Pt to report decreased pain in L shoulder and UE to 0-1/10 with activity     Time  6    Period  Weeks    Status  Achieved      PT LONG TERM GOAL #2   Title  Pt to demo increased strength of L shoulder to at least 4+/5 to improve ability for IADLs.     Time  6    Period  Weeks    Status  Achieved      PT LONG TERM GOAL #3   Title  Pt to demo improved cervical ROM to be [facility] for pt age    Time  6    Period  Weeks    Status  Partially Met            Plan - 06/29/17 1417    Clinical Impression Statement  Pt with much improved pain in neck and shoulder. She states pain 1x in the last week, in deltoid region, when she is walking/exercising. She has ROM WNL, only mild limitations for cervical rotation. She has improved UE strength, and improved postural awareness, able to self correct. Mechanics for ther ex and HEP have been reviewed several times, pt continues to require cueing for correct performance, final HEP with instructions printed for pt today. Pt with no further deficits that require continued care, and is ready for d/c to HEP. Pt in agreement with plan. She has mild soreness in arm, likely due to neck degeneration. Pt with MD follow up tomorrow.      Rehab Potential  Fair    PT Frequency  2x / week  PT Duration  6 weeks    PT Treatment/Interventions  ADLs/Self Care Home Management;Cryotherapy;Electrical Stimulation;Iontophoresis 56m/ml Dexamethasone;Functional mobility training;Ultrasound;Moist Heat;Therapeutic activities;Therapeutic exercise;Neuromuscular re-education;Patient/family education;Manual techniques;Dry needling;Taping;Vasopneumatic Device;Passive range of motion    PT Next Visit Plan  Teach HEP for shoulder        Patient will benefit from skilled therapeutic  intervention in order to improve the following deficits and impairments:  Hypomobility, Pain, Decreased strength, Decreased activity tolerance, Increased muscle spasms, Improper body mechanics, Decreased range of motion, Impaired flexibility  Visit Diagnosis: Chronic left shoulder pain  Neck pain     Problem List Patient Active Problem List   Diagnosis Date Noted  . Cervical radiculitis 04/09/2017  . Left shoulder pain 04/09/2017  . Dyspnea on exertion 05/14/2016  . 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 Lymphedema 01/14/2016  . Left arm swelling 12/25/2015  . SV[place] ([place]ricular [place]) ([place]   . [place] disease ([place] 03/08/2015  . Dizziness 01/24/2015  . Urinary frequency 01/13/2015  . Dizziness and giddiness 01/09/2015  . Gallstones 02/21/2013  . Breast cancer of [place] of right [place] breast ([place] 11/28/2012  . Muscle cramping 09/12/2012  . Right hip pain 09/12/2012  . Lower back pain 09/12/2012  . COPD GOLD I    . Hx of radiation therapy   . Right shoulder pain 09/14/2011  . Preventative health care 07/29/2010  . Skin lesion of left leg 07/29/2010  . Paresthesia 07/29/2010  . GERD 03/28/2010  . CONSTIPATION 03/28/2010  . Osteoarthrosis, 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  . Anxiety state 05/03/2009  . Hyperlipidemia 06/14/2007  . FATIGUE 06/14/2007  . 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 DIVERTICULOSIS, COLON 12/17/2006  . Cough 12/17/2006  . IRRITABLE BOWEL SYNDROME, HX OF 12/17/2006   LLyndee Hensen PT, DPT 3:33 PM  06/29/17   [place] [place] [address]Phone: [phone]  Fax:  [phone] Name: Stacey MERKMRN: [id number] of Birth: [birth year]  PHYSICAL THERAPY DISCHARGE SUMMARY 8  Plan: Patient agrees to discharge.  Patient goals were met. Patient is being discharged due to meeting the stated rehab goals.  ?????        LLyndee Hensen PT, DPT 3:33 PM  06/29/17

## 2017-06-29 NOTE — Progress Notes (Signed)
[name] [place], [place] [address] Phone: [phone] Subjective:     CC: Shoulder pain  TFT:[id number]  Stacey [name] is a 77 y.o. female coming in with complaint of left shoulder pain. She has been doing well with therapy and yesterday her therapist and patient decided that they would discontinue physical therapy. She does still have a tingling in her left arm when she goes for a walk daily. She notes that she wears an elastic sleeve for lymphodema which she said may cause the tingling sometimes. She gets relief from the tingling when she moves her arm overhead. Is using gabapentin before bed but is unsure if this is helping to reduce her pan.  Patient did have x-rays of the neck that had only mild osteoarthritic changes.  Has finally finished with the physical therapy as stated above with minimal improvement      Past Medical History:  Diagnosis Date  . ANEMIA-NOS   . ANXIETY   . Breast cancer ([place]) 1997 L, 2012 R   s/p chemo/xrt  . COPD    resolved  . DIVERTICULOSIS, COLON 2008  . Dizziness   . GERD   . Hx of radiation therapy 10/19/11 -12/03/11   right breast  . HYPERLIPIDEMIA   . IRRITABLE BOWEL SYNDROME, HX OF   . Left-sided carotid artery disease (HCC)    moderate left ICA stenosis  . OSTEOARTHRITIS, HAND   . PSVT (paroxysmal supraventricular tachycardia) (HCC)    symptomatic on event monitor   Past Surgical History:  Procedure Laterality Date  . ABDOMINAL HYSTERECTOMY    . APPENDECTOMY    . BREAST BIOPSY  11/14/10    r breast: inv, insitu mammary carcinoma w/calcif, er/pr +, her2 -  . BREAST SURGERY     lumpectomy  . CATARACT EXTRACTION     both eyes  . ELECTROPHYSIOLOGIC STUDY N/A 05/10/2015   Procedure: SVT Ablation;  Surgeon: Will Meredith Leeds, MD;  Location: [facility];  Service: Cardiovascular;  Laterality: N/A;  . HERNIA REPAIR    . inguinal herniorrhapy  1984   left  . rectal fissure repair    .  s/p benign breast biopsy  2003   right  . s/p left foot surgury  2009  . 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 spiral fx left foot  2008   no surgury  . TMJ ARTHROPLASTY  1989  . [place]- to remove scar tissue growth   . TONSILLECTOMY     Social History   Socioeconomic History  . Marital status: Divorced    Spouse name: Not on file  . Number of children: 0  . Years of education: Not on file  . Highest education level: Not on file  Occupational History  . Occupation: retired Administrator, sports  . Financial resource strain: Not on file  . Food insecurity:    Worry: Not on file    Inability: Not on file  . Transportation needs:    Medical: Not on file    Non-medical: Not on file  Tobacco Use  . Smoking status: Former Smoker    Packs/day: 1.00    Years: 54.00    Pack years: 54.00    Types: Cigarettes    Last attempt to quit: 01/03/2016    Years since quitting: 1.4  . Smokeless tobacco: Never Used  Substance and Sexual Activity  . Alcohol  use: Yes    Comment: rare/ drinks socially  . Drug use: No  . Sexual activity: Not on file  Lifestyle  . Physical activity:    Days per week: Not on file    Minutes per session: Not on file  . Stress: Not on file  Relationships  . Social connections:    Talks on phone: Not on file    Gets together: Not on file    Attends religious service: Not on file    Active member of club or organization: Not on file    Attends meetings of clubs or organizations: Not on file    Relationship status: Not on file  Other Topics Concern  . Not on file  Social History Narrative   Patient gets no regular exercise   No biological children   2 step children   Allergies  Allergen Reactions  . Bee Venom Swelling  . Clindamycin/Lincomycin Diarrhea and Nausea And Vomiting  . Codeine Nausea And Vomiting   Family History  Problem Relation Age of Onset  . Alcohol abuse Mother        ETOH dependence  . Hypertension  Mother   . Stroke Mother   . Colon polyps Mother   . Diabetes Mother   . Pancreatic cancer Mother 29  . Uterine cancer Mother 22  . Lymphoma Brother        burkitts  . Lung cancer Paternal Uncle   . Lung cancer Maternal Grandmother 52       non-smoker  . Lung cancer Maternal Grandfather   . Breast cancer Cousin        maternal cousin, dx in her mid 56s  . Brain cancer Cousin        maternal cousin's son; dx in his 11s  . Testicular cancer Cousin        maternal cousin's son;   . Breast cancer Cousin        paternal cousin; dx in her 17s  . Breast cancer Cousin        1 maternal, 2 paternal  . Colon cancer Neg Hx   . Esophageal cancer Neg Hx   . Stomach cancer Neg Hx   . Rectal cancer Neg Hx      Past medical history, social, surgical and family history all reviewed in electronic medical record.  No pertanent information unless stated regarding to the chief complaint.   Review of Systems:Review of systems updated and as accurate as of 06/30/17  No headache, visual changes, nausea, vomiting, diarrhea, constipation, dizziness, abdominal pain, skin rash, fevers, chills, night sweats, weight loss, swollen lymph nodes, body aches, joint swelling,chest pain, shortness of breath, mood changes.  Positive muscle aches  Objective  Blood pressure 120/84, pulse 90, height '5\' 2"'  (1.575 m), weight 165 lb (74.8 kg), SpO2 97 %. Systems examined below as of 06/30/17   General: No apparent distress alert and oriented x3 mood and affect normal, dressed appropriately.  HEENT: Pupils equal, extraocular movements intact  Respiratory: Patient's speak in full sentences and does not appear short of breath  Cardiovascular: No lower extremity edema, non tender, no erythema  Skin: Warm dry intact with no signs of infection or rash on extremities or on axial skeleton.  Abdomen: Soft nontender  Neuro: Cranial nerves II through XII are intact, neurovascularly intact in all extremities with 2+ DTRs and 2+  pulses.  Lymph: No lymphadenopathy of posterior or anterior cervical chain or axillae bilaterally.  Gait normal with good balance  and coordination.  MSK:  Non tender with full range of motion and good stability and symmetric strength and tone of  elbows, wrist, hip, knee and ankles bilaterally.  Moderate to severe arthritic changes of multiple joints  Left shoulder still shows the patient does have positive impingement.  Patient has 4+ out of 5 strength compared to the contralateral side.  Nearly full range of motion with mild crepitus.  Neck exam shows stiffness in all planes but negative Spurling's.  Grip strength is intact.  Neurovascularly intact distally    Impression and Recommendations:     This case required medical decision making of moderate complexity.      Note: This dictation was prepared with Dragon dictation along with smaller phrase technology. Any transcriptional errors that result from this process are unintentional.

## 2017-06-30 ENCOUNTER — Encounter: Payer: Self-pay | Admitting: Family Medicine

## 2017-06-30 ENCOUNTER — Ambulatory Visit (INDEPENDENT_AMBULATORY_CARE_PROVIDER_SITE_OTHER): Payer: Medicare Other | Admitting: Family Medicine

## 2017-06-30 DIAGNOSIS — M5412 Radiculopathy, cervical region: Secondary | ICD-10-CM | POA: Diagnosis not present

## 2017-06-30 DIAGNOSIS — M25512 Pain in left shoulder: Secondary | ICD-10-CM | POA: Diagnosis not present

## 2017-06-30 DIAGNOSIS — I6522 Occlusion and stenosis of left carotid artery: Secondary | ICD-10-CM

## 2017-06-30 MED ORDER — TIZANIDINE HCL 2 MG PO CAPS: 2.0000 mg | ORAL_CAPSULE | Freq: Every evening | ORAL | 1 refills | Status: DC | PRN

## 2017-06-30 NOTE — Assessment & Plan Note (Signed)
No radicular symptoms at the moment.  Increase gabapentin to 300 mg and given muscle relaxant.  Once again discussed with patient about MRI would need to consider possible epidurals which patient states she declines at the moment.  We will continue to monitor.  Follow-up again in 4 weeks

## 2017-06-30 NOTE — Assessment & Plan Note (Signed)
Still difficult to assess completely.  We did have an MRI from December 2017 that did show a subchondral cyst that is likely secondary to arthritis.  Bone scan did not show any type of metastasis.  We discussed with patient about potential injections which patient declined.  We discussed repeating the MRI but patient does not have systemic findings of any concern for any metastatic disease at the time.  We discussed icing regimen and home exercises and encouraged trying to increase gabapentin.  Patient will follow-up again in 4 to 6 weeks

## 2017-06-30 NOTE — Patient Instructions (Signed)
Good to see you  Ice is your friend Increase the gabapentin to 300mg  at night and see if that helps Zanaflex at night as well to see if it helps the muscles Continue the exercises See me again in 6 weeks and if not better we need to discuss injection in the shoulder and possible MRI of the neck and shoulder

## 2017-07-01 ENCOUNTER — Other Ambulatory Visit: Payer: Self-pay | Admitting: Family Medicine

## 2017-07-01 ENCOUNTER — Other Ambulatory Visit: Payer: Self-pay | Admitting: *Deleted

## 2017-07-01 MED ORDER — BACLOFEN 10 MG PO TABS: 10.0000 mg | ORAL_TABLET | Freq: Two times a day (BID) | ORAL | 0 refills | Status: DC | PRN

## 2017-07-01 NOTE — Telephone Encounter (Signed)
Insurance does not cover zanaflex. Baclofen sent into pharmacy per dr Tamala Julian.

## 2017-07-02 NOTE — Telephone Encounter (Signed)
Refill done.  

## 2017-07-06 ENCOUNTER — Other Ambulatory Visit: Payer: Self-pay | Admitting: Internal Medicine

## 2017-07-07 ENCOUNTER — Encounter: Payer: Self-pay | Admitting: Family Medicine

## 2017-07-11 ENCOUNTER — Other Ambulatory Visit: Payer: Self-pay | Admitting: Internal Medicine

## 2017-07-27 ENCOUNTER — Other Ambulatory Visit: Payer: Self-pay | Admitting: Internal Medicine

## 2017-08-10 NOTE — Progress Notes (Signed)
[name] [address] [place], [place] [address] Phone: [phone] Subjective:    I'm seeing this patient by the request  of:    CC: Neck and shoulder pain follow-up  KXF:[id number]  Stacey [name] is a 77 y.o. female coming in with complaint of neck and shoulder pain. She is not longer taking any medications for pain. Pain is intermittent but is not as bad as it was last visit. Patient does note that she has had some "flare ups" since last visit. This seems to continue to occur with walking and when she wears a compression stockinette on her left arm.  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  Patient has been noncompliant continues to not do the exercises regularly.  Continues to have neck pain.  States that it is very sore all the time with some true pain with certain movements.  No radiation down the arm and no numbness.  Denies true weakness but sometimes finds it difficult such as putting on a jacket    Past Medical History:  Diagnosis Date  . ANEMIA-NOS   . ANXIETY   . Breast cancer ([facility]) 1997 L, 2012 R   s/p chemo/xrt  . COPD    resolved  . DIVERTICULOSIS, COLON 2008  . Dizziness   . GERD   . Hx of radiation therapy 10/19/11 -12/03/11   right breast  . HYPERLIPIDEMIA   . IRRITABLE BOWEL SYNDROME, HX OF   . Left-sided carotid artery disease (HCC)    moderate left ICA stenosis  . OSTEOARTHRITIS, HAND   . PSVT (paroxysmal supraventricular tachycardia) (HCC)    symptomatic on event monitor   Past Surgical History:  Procedure Laterality Date  . ABDOMINAL HYSTERECTOMY    . APPENDECTOMY    . BREAST BIOPSY  11/14/10    r breast: inv, insitu mammary carcinoma w/calcif, er/pr +, her2 -  . BREAST SURGERY     lumpectomy  . CATARACT EXTRACTION     both eyes  . ELECTROPHYSIOLOGIC STUDY N/A 05/10/2015   Procedure: SVT Ablation;  Surgeon: Will Meredith Leeds,  MD;  Location: [facility];  Service: Cardiovascular;  Laterality: N/A;  . HERNIA REPAIR    . inguinal herniorrhapy  1984   left  . rectal fissure repair    . s/p benign breast biopsy  2003   right  . s/p left foot surgury  2009  . 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 spiral fx left foot  2008   no surgury  . TMJ ARTHROPLASTY  1989  . [place]- to remove scar tissue growth   . TONSILLECTOMY     Social History   Socioeconomic History  . Marital status: Divorced    Spouse name: Not on file  . Number of children: 0  . Years of education: Not on file  . Highest education level: Not on file  Occupational History  . Occupation: retired Administrator, sports  . Financial resource strain: Not on file  . Food insecurity:    Worry: Not on file    Inability: Not on file  . Transportation needs:    Medical: Not on file    Non-medical: Not on file  Tobacco Use  . Smoking status: Former Smoker    Packs/day: 1.00    Years: 54.00    Pack  years: 54.00    Types: Cigarettes    Last attempt to quit: 01/03/2016    Years since quitting: 1.6  . Smokeless tobacco: Never Used  Substance and Sexual Activity  . Alcohol use: Yes    Comment: rare/ drinks socially  . Drug use: No  . Sexual activity: Not on file  Lifestyle  . Physical activity:    Days per week: Not on file    Minutes per session: Not on file  . Stress: Not on file  Relationships  . Social connections:    Talks on phone: Not on file    Gets together: Not on file    Attends religious service: Not on file    Active member of club or organization: Not on file    Attends meetings of clubs or organizations: Not on file    Relationship status: Not on file  Other Topics Concern  . Not on file  Social History Narrative   Patient gets no regular exercise   No biological children   2 step children   Allergies  Allergen Reactions  . Bee Venom Swelling  . Clindamycin/Lincomycin Diarrhea and  Nausea And Vomiting  . Codeine Nausea And Vomiting   Family History  Problem Relation Age of Onset  . Alcohol abuse Mother        ETOH dependence  . Hypertension Mother   . Stroke Mother   . Colon polyps Mother   . Diabetes Mother   . Pancreatic cancer Mother 9  . Uterine cancer Mother 30  . Lymphoma Brother        burkitts  . Lung cancer Paternal Uncle   . Lung cancer Maternal Grandmother 50       non-smoker  . Lung cancer Maternal Grandfather   . Breast cancer Cousin        maternal cousin, dx in her mid 46s  . Brain cancer Cousin        maternal cousin's son; dx in his 15s  . Testicular cancer Cousin        maternal cousin's son;   . Breast cancer Cousin        paternal cousin; dx in her 28s  . Breast cancer Cousin        1 maternal, 2 paternal  . Colon cancer Neg Hx   . Esophageal cancer Neg Hx   . Stomach cancer Neg Hx   . Rectal cancer Neg Hx      Past medical history, social, surgical and family history all reviewed in electronic medical record.  No pertanent information unless stated regarding to the chief complaint.   Review of Systems:Review of systems updated and as accurate as of 08/11/17  No headache, visual changes, nausea, vomiting, diarrhea, constipation, dizziness, abdominal pain, skin rash, fevers, chills, night sweats, weight loss, swollen lymph nodes, body aches, joint swelling, muscle aches, chest pain, shortness of breath, mood changes.   Objective  Blood pressure 132/88, pulse 97, height '5\' 2"'  (1.575 m), weight 167 lb (75.8 kg), SpO2 97 %. Systems examined below as of 08/11/17   General: No apparent distress alert and oriented x3 mood and affect normal, dressed appropriately.  HEENT: Pupils equal, extraocular movements intact  Respiratory: Patient's speak in full sentences and does not appear short of breath  Cardiovascular: No lower extremity edema, non tender, no erythema  Skin: Warm dry intact with no signs of infection or rash on  extremities or on axial skeleton.  Abdomen: Soft nontender  Neuro: Cranial  nerves II through XII are intact, neurovascularly intact in all extremities with 2+ DTRs and 2+ pulses.  Lymph: No lymphadenopathy of posterior or anterior cervical chain or axillae bilaterally.  Gait normal with good balance and coordination.  MSK:  tender with full range of motion and good stability and symmetric strength and tone of  elbows, wrist, hip, knee and ankles bilaterally.  Arthritic changes of multiple joints Lymphedema of the left arm noted Patient's left shoulder has some mild atrophy compared to previous exam.  Positive impingement.  Crepitus noted.  Neck exam shows loss of range of motion.  Significant mostly extension.  Mild positive Spurling's on the left side.  Neurovascularly intact distally tendon reflexes intact    Impression and Recommendations:     This case required medical decision making of moderate complexity.      Note: This dictation was prepared with Dragon dictation along with smaller phrase technology. Any transcriptional errors that result from this process are unintentional.

## 2017-08-11 ENCOUNTER — Ambulatory Visit (INDEPENDENT_AMBULATORY_CARE_PROVIDER_SITE_OTHER): Payer: Medicare Other | Admitting: Family Medicine

## 2017-08-11 ENCOUNTER — Encounter: Payer: Self-pay | Admitting: Family Medicine

## 2017-08-11 VITALS — BP 132/88 | HR 97 | Ht 62.0 in | Wt 167.0 lb

## 2017-08-11 DIAGNOSIS — I6522 Occlusion and stenosis of left carotid artery: Secondary | ICD-10-CM

## 2017-08-11 DIAGNOSIS — M5412 Radiculopathy, cervical region: Secondary | ICD-10-CM | POA: Diagnosis not present

## 2017-08-11 DIAGNOSIS — M25512 Pain in left shoulder: Secondary | ICD-10-CM | POA: Diagnosis not present

## 2017-08-11 NOTE — Assessment & Plan Note (Signed)
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  History of breast cancer.  Do not feel that repeat imaging is necessary with not changing management.  Discussed possible injection for diagnostic and therapeutic intervention.  Patient has declined this multiple times and continues to do so.  Follow-up with me again in 4 weeks otherwise.

## 2017-08-11 NOTE — Patient Instructions (Signed)
Good to see you  I am sorry you are hurting We will get MRI of the neck  I think it is from your shoulder and we may need injection but you call me if it worsens.  We will call you when we get MRI results

## 2017-08-11 NOTE — Assessment & Plan Note (Signed)
X-rays have shown moderate arthritic changes.  This was independently visualized by me.  I do believe that an MRI could be necessary at this time to further evaluate for any nerve impingement that could be contributing to the pain.  History of breast cancer on the side as well.  Patient does have a lymphedema needs to make sure there is no mass that is appreciated.  Previous imaging in December 2017 of the shoulder showed arthritis but no true compression causing the lymphedema.  Follow-up again after MRI to discuss

## 2017-08-20 ENCOUNTER — Ambulatory Visit
Admission: RE | Admit: 2017-08-20 | Discharge: 2017-08-20 | Disposition: A | Discharge status: Home / Self Care | Payer: Medicare Other | Source: Ambulatory Visit | Attending: Family Medicine | Admitting: Family Medicine

## 2017-08-20 DIAGNOSIS — M5412 Radiculopathy, cervical region: Secondary | ICD-10-CM | POA: Diagnosis not present

## 2017-08-22 ENCOUNTER — Other Ambulatory Visit: Payer: Self-pay | Admitting: Internal Medicine

## 2017-08-23 DIAGNOSIS — M19049 Primary osteoarthritis, unspecified hand: Secondary | ICD-10-CM | POA: Insufficient documentation

## 2017-08-23 DIAGNOSIS — M13841 Other specified arthritis, right hand: Secondary | ICD-10-CM | POA: Diagnosis not present

## 2017-08-23 DIAGNOSIS — M79641 Pain in right hand: Secondary | ICD-10-CM | POA: Diagnosis not present

## 2017-08-23 MED ORDER — TIOTROPIUM BROMIDE MONOHYDRATE 2.5 MCG/ACT IN AERS: 2.0000 | INHALATION_SPRAY | Freq: Every day | RESPIRATORY_TRACT | 2 refills | Status: DC

## 2017-09-13 DIAGNOSIS — D485 Neoplasm of uncertain behavior of skin: Secondary | ICD-10-CM | POA: Diagnosis not present

## 2017-09-13 DIAGNOSIS — D1800 Hemangioma unspecified site: Secondary | ICD-10-CM | POA: Diagnosis not present

## 2017-09-13 DIAGNOSIS — L814 Other melanin hyperpigmentation: Secondary | ICD-10-CM | POA: Diagnosis not present

## 2017-09-13 DIAGNOSIS — D225 Melanocytic nevi of trunk: Secondary | ICD-10-CM | POA: Diagnosis not present

## 2017-09-13 DIAGNOSIS — I10 Essential (primary) hypertension: Secondary | ICD-10-CM | POA: Diagnosis not present

## 2017-09-13 DIAGNOSIS — L821 Other seborrheic keratosis: Secondary | ICD-10-CM | POA: Diagnosis not present

## 2017-09-13 DIAGNOSIS — Z86018 Personal history of other benign neoplasm: Secondary | ICD-10-CM | POA: Diagnosis not present

## 2017-09-13 DIAGNOSIS — D229 Melanocytic nevi, unspecified: Secondary | ICD-10-CM | POA: Diagnosis not present

## 2017-09-13 DIAGNOSIS — L570 Actinic keratosis: Secondary | ICD-10-CM | POA: Diagnosis not present

## 2017-09-13 DIAGNOSIS — L589 Radiodermatitis, unspecified: Secondary | ICD-10-CM | POA: Diagnosis not present

## 2017-09-16 NOTE — Progress Notes (Signed)
[name] [address], [place] [address] Phone: [phone] Subjective:     CC: Left shoulder pain  Stacey [name]  Stacey [name] is a 77 y.o. female coming in with complaint of left shoulder pain. Would like an injection today.  Patient has had left shoulder pain.  Found to have arthritic changes.  Also cervical radiculopathy.  Patient states that the gabapentin has helped the neck somewhat not helping the shoulder as much.      Past Medical History:  Diagnosis Date  . ANEMIA-NOS   . ANXIETY   . Breast cancer ([facility]) 1997 L, 2012 R   s/p chemo/xrt  . COPD    resolved  . DIVERTICULOSIS, COLON 2008  . Dizziness   . GERD   . Hx of radiation therapy 10/19/11 -12/03/11   right breast  . HYPERLIPIDEMIA   . IRRITABLE BOWEL SYNDROME, HX OF   . Left-sided carotid artery disease (HCC)    moderate left ICA stenosis  . OSTEOARTHRITIS, HAND   . PSVT (paroxysmal supraventricular tachycardia) (HCC)    symptomatic on event monitor   Past Surgical History:  Procedure Laterality Date  . ABDOMINAL HYSTERECTOMY    . APPENDECTOMY    . BREAST BIOPSY  11/14/10    r breast: inv, insitu mammary carcinoma w/calcif, er/pr +, her2 -  . BREAST SURGERY     lumpectomy  . CATARACT EXTRACTION     both eyes  . ELECTROPHYSIOLOGIC STUDY N/A 05/10/2015   Procedure: SVT Ablation;  Surgeon: Will Meredith Leeds, MD;  Location: [facility];  Service: Cardiovascular;  Laterality: N/A;  . HERNIA REPAIR    . inguinal herniorrhapy  1984   left  . rectal fissure repair    . s/p benign breast biopsy  2003   right  . s/p left foot surgury  2009  . 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 spiral fx left foot  2008   no surgury  . TMJ ARTHROPLASTY  1989  . [place]- to remove scar tissue growth   . TONSILLECTOMY     Social History   Socioeconomic History  . Marital status: Divorced    Spouse name: Not on file  . Number of children: 0    . Years of education: Not on file  . Highest education level: Not on file  Occupational History  . Occupation: retired Administrator, sports  . Financial resource strain: Not on file  . Food insecurity:    Worry: Not on file    Inability: Not on file  . Transportation needs:    Medical: Not on file    Non-medical: Not on file  Tobacco Use  . Smoking status: Former Smoker    Packs/day: 1.00    Years: 54.00    Pack years: 54.00    Types: Cigarettes    Last attempt to quit: 01/03/2016    Years since quitting: 1.7  . Smokeless tobacco: Never Used  Substance and Sexual Activity  . Alcohol use: Yes    Comment: rare/ drinks socially  . Drug use: No  . Sexual activity: Not on file  Lifestyle  . Physical activity:    Days per week: Not on file    Minutes per session: Not on file  . Stress: Not on file  Relationships  . Social connections:    Talks on phone: Not on file    Gets  together: Not on file    Attends religious service: Not on file    Active member of club or organization: Not on file    Attends meetings of clubs or organizations: Not on file    Relationship status: Not on file  Other Topics Concern  . Not on file  Social History Narrative   Patient gets no regular exercise   No biological children   2 step children   Allergies  Allergen Reactions  . Bee Venom Swelling  . Clindamycin/Lincomycin Diarrhea and Nausea And Vomiting  . Codeine Nausea And Vomiting   Family History  Problem Relation Age of Onset  . Alcohol abuse Mother        ETOH dependence  . Hypertension Mother   . Stroke Mother   . Colon polyps Mother   . Diabetes Mother   . Pancreatic cancer Mother 11  . Uterine cancer Mother 75  . Lymphoma Brother        burkitts  . Lung cancer Paternal Uncle   . Lung cancer Maternal Grandmother 19       non-smoker  . Lung cancer Maternal Grandfather   . Breast cancer Cousin        maternal cousin, dx in her mid 63s  . Brain cancer Cousin         maternal cousin's son; dx in his 3s  . Testicular cancer Cousin        maternal cousin's son;   . Breast cancer Cousin        paternal cousin; dx in her 12s  . Breast cancer Cousin        1 maternal, 2 paternal  . Colon cancer Neg Hx   . Esophageal cancer Neg Hx   . Stomach cancer Neg Hx   . Rectal cancer Neg Hx      Past medical history, social, surgical and family history all reviewed in electronic medical record.  No pertanent information unless stated regarding to the chief complaint.   Review of Systems:Review of systems updated and as accurate as of 09/20/17  No headache, visual changes, nausea, vomiting, diarrhea, constipation, dizziness, abdominal pain, skin rash, fevers, chills, night sweats, weight loss, swollen lymph nodes, chest pain, shortness of breath, mood changes.  Positive muscle aches, body aches  Objective  Blood pressure 120/60, pulse 97, height '5\' 2"'  (1.575 m), weight 162 lb (73.5 kg), SpO2 94 %. Systems examined below as of 09/20/17   General: No apparent distress alert and oriented x3 mood and affect normal, dressed appropriately.  HEENT: Pupils equal, extraocular movements intact  Respiratory: Patient's speak in full sentences and does not appear short of breath  Cardiovascular: No lower extremity edema, non tender, no erythema  Skin: Warm dry intact with no signs of infection or rash on extremities or on axial skeleton.  Abdomen: Soft nontender  Neuro: Cranial nerves II through XII are intact, neurovascularly intact in all extremities with 2+ DTRs and 2+ pulses.  Lymph: No lymphadenopathy of posterior or anterior cervical chain or axillae bilaterally.  Gait normal with good balance and coordination.  MSK: Mild tender with full range of motion and good stability and symmetric strength and tone of , elbows, wrist, hip, knee and ankles bilaterally.  Left shoulder exam shows some decrease in range of motion in all planes.  Patient does have significant decrease  in external range of motion.  Crepitus noted.  4 out of 5 strength of rotator cuff compared to the contralateral side  After informed written and verbal consent, patient was seated on exam table. Left shoulder was prepped with alcohol swab and utilizing posterior approach, patient's right glenohumeral space was injected with 4:1  marcaine 0.5%: Kenalog 29m/dL. Patient tolerated the procedure well without immediate complications.     Impression and Recommendations:     This case required medical decision making of moderate complexity.      Note: This dictation was prepared with Dragon dictation along with smaller phrase technology. Any transcriptional errors that result from this process are unintentional.

## 2017-09-20 ENCOUNTER — Ambulatory Visit (INDEPENDENT_AMBULATORY_CARE_PROVIDER_SITE_OTHER): Payer: Medicare Other | Admitting: Family Medicine

## 2017-09-20 ENCOUNTER — Encounter

## 2017-09-20 ENCOUNTER — Encounter: Payer: Self-pay | Admitting: Family Medicine

## 2017-09-20 DIAGNOSIS — I6522 Occlusion and stenosis of left carotid artery: Secondary | ICD-10-CM

## 2017-09-20 DIAGNOSIS — M12812 Other specific arthropathies, not elsewhere classified, left shoulder: Secondary | ICD-10-CM | POA: Insufficient documentation

## 2017-09-20 NOTE — Patient Instructions (Addendum)
Good to see you  Injected the shoulder today  Ice is your friend  Stay active.  Good luck with your hand surgery  See me again in 12 weeks

## 2017-09-20 NOTE — Assessment & Plan Note (Signed)
Patient given injection.  Tolerated procedure well.  Discussed icing regimen and home exercise.  Discussed which activities of doing which wants to avoid.  Increase activity as tolerated.  Discussed with patient can repeat every 3 months.  Having hand surgery in September

## 2017-09-30 ENCOUNTER — Other Ambulatory Visit: Payer: Self-pay | Admitting: Internal Medicine

## 2017-10-20 ENCOUNTER — Telehealth: Payer: Self-pay | Admitting: Internal Medicine

## 2017-10-20 MED ORDER — DICLOFENAC SODIUM 1 % TD GEL: 4.0000 g | Freq: Four times a day (QID) | TRANSDERMAL | 11 refills | Status: DC | PRN

## 2017-10-20 NOTE — Telephone Encounter (Signed)
Copied from [place] [id number]. Topic: Quick Communication - Rx Refill/Question >> Oct 20, 2017 12:01 PM Judyann Munson wrote: Medication: diclofenac sodium (VOLTAREN) 1 % GEL   Has the patient contacted their pharmacy?yes   Preferred Pharmacy (with phone number or street name): [facility] [place], [address] AT [address] [phone] (Phone) [phone] (Fax)    Agent: Please be advised that RX refills may take up to 3 business days. We ask that you follow-up with your pharmacy.

## 2017-10-26 ENCOUNTER — Other Ambulatory Visit: Payer: Self-pay | Admitting: Orthopedic Surgery

## 2017-10-26 DIAGNOSIS — M13141 Monoarthritis, not elsewhere classified, right hand: Secondary | ICD-10-CM | POA: Diagnosis not present

## 2017-10-26 DIAGNOSIS — M65841 Other synovitis and tenosynovitis, right hand: Secondary | ICD-10-CM | POA: Diagnosis not present

## 2017-10-26 DIAGNOSIS — M13841 Other specified arthritis, right hand: Secondary | ICD-10-CM | POA: Diagnosis not present

## 2017-10-26 DIAGNOSIS — M659 Synovitis and tenosynovitis, unspecified: Secondary | ICD-10-CM | POA: Diagnosis not present

## 2017-10-26 DIAGNOSIS — G8918 Other acute postprocedural pain: Secondary | ICD-10-CM | POA: Diagnosis not present

## 2017-11-08 DIAGNOSIS — M79641 Pain in right hand: Secondary | ICD-10-CM | POA: Diagnosis not present

## 2017-11-08 DIAGNOSIS — M13841 Other specified arthritis, right hand: Secondary | ICD-10-CM | POA: Diagnosis not present

## 2017-11-08 DIAGNOSIS — Z4789 Encounter for other orthopedic aftercare: Secondary | ICD-10-CM | POA: Diagnosis not present

## 2017-11-09 ENCOUNTER — Other Ambulatory Visit: Payer: Self-pay | Admitting: Internal Medicine

## 2017-11-15 ENCOUNTER — Telehealth: Payer: Self-pay | Admitting: Internal Medicine

## 2017-11-15 MED ORDER — TIOTROPIUM BROMIDE MONOHYDRATE 2.5 MCG/ACT IN AERS: 2.0000 | INHALATION_SPRAY | Freq: Every day | RESPIRATORY_TRACT | 0 refills | Status: DC

## 2017-11-15 NOTE — Telephone Encounter (Signed)
Called and spoke with patient. She needs a refill on her spiriva. Patient states she has been trying to get a hold of the office for months through [place]. Patient has not been seen in office since 08/27/2016. I mentioned to her she needs to come into office before and further refills could be filled. Patient states she has asked if she needed to come in but people keep refilling her Rx. I explained that if it has been a year or greater, we do not fill RX without an office visit. I scheduled patient for 12/01/2017 with Dr. Melvyn Novas to have her refills renewed. I also sent in 1 month supply to get her to this appointment.

## 2017-11-22 DIAGNOSIS — Z4789 Encounter for other orthopedic aftercare: Secondary | ICD-10-CM | POA: Diagnosis not present

## 2017-11-22 DIAGNOSIS — M79641 Pain in right hand: Secondary | ICD-10-CM | POA: Diagnosis not present

## 2017-11-22 DIAGNOSIS — M13841 Other specified arthritis, right hand: Secondary | ICD-10-CM | POA: Diagnosis not present

## 2017-11-25 DIAGNOSIS — M79641 Pain in right hand: Secondary | ICD-10-CM | POA: Diagnosis not present

## 2017-11-26 DIAGNOSIS — Z23 Encounter for immunization: Secondary | ICD-10-CM | POA: Diagnosis not present

## 2017-11-30 DIAGNOSIS — M79641 Pain in right hand: Secondary | ICD-10-CM | POA: Diagnosis not present

## 2017-12-01 ENCOUNTER — Encounter: Payer: Self-pay | Admitting: Internal Medicine

## 2017-12-01 ENCOUNTER — Ambulatory Visit (INDEPENDENT_AMBULATORY_CARE_PROVIDER_SITE_OTHER): Payer: Medicare Other | Admitting: Internal Medicine

## 2017-12-01 DIAGNOSIS — J449 Chronic obstructive pulmonary disease, unspecified: Secondary | ICD-10-CM | POA: Diagnosis not present

## 2017-12-01 DIAGNOSIS — I6522 Occlusion and stenosis of left carotid artery: Secondary | ICD-10-CM

## 2017-12-01 MED ORDER — TIOTROPIUM BROMIDE MONOHYDRATE 2.5 MCG/ACT IN AERS: 2.0000 | INHALATION_SPRAY | Freq: Every day | RESPIRATORY_TRACT | 11 refills | Status: DC

## 2017-12-01 NOTE — Progress Notes (Signed)
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 Pulmonary Consult    Referred by Dr. Cathlean Cower for eval of abnormal PFT. Pt c/o SOB for the past year, progressively getting worse. She states she tires very easily and her exercise tolerance has gone downhill.  She has rx for albuterol inhaler, but never had it filled.   indolent onset doe x 2012 worse since summer 2017 but since then about the same  [place] MMRC1 = can walk nl pace, flat grade, can't hurry or go uphills or steps s sob   Was walking 18 min / mile prior to 23 min mile now  Taking prilosec at least one daily right before bfast for overt HB Was prescribed inhalers but never filled rx or tried one of any kind rec Spiriva 2 puffs each am x 2 week sample and if your ex tolerance is better, stay on it indefinitely  Work on inhaler technique:   You have mild copd (GOLD I)    12/01/2017  f/u ov/[place] re:  Copd gold I  maint on spiriva only  Chief Complaint  Patient presents with  . Follow-up    c/o sob with exertion  Dyspnea:  [place] park and sob on slopes since "I don't remember" - total walk s stop x 27mn  Min at avg pace One mile in 26 min Cough: no Sleeping: flat  SABA use: no 02: no     No obvious day to day or daytime variability or assoc excess/ purulent sputum or mucus plugs or hemoptysis or cp or chest tightness, subjective wheeze or overt sinus or hb symptoms.   Sleeping as above without nocturnal  or early am  exacerbation  of respiratory  c/o's or need for noct saba. Also denies any obvious fluctuation of symptoms with weather or environmental changes or other aggravating or alleviating factors except as outlined above   No unusual exposure hx or h/o childhood pna/ asthma or knowledge of premature birth.  Current Allergies, Complete Past Medical History, Past Surgical History, Family History, and Social History were reviewed in [employer] record.  ROS  The following are not active complaints unless bolded Hoarseness, sore throat, dysphagia, dental problems, itching, sneezing,  nasal congestion or discharge of excess mucus or purulent secretions, ear ache,   fever, chills, sweats, unintended wt loss or wt gain, classically pleuritic or exertional cp,  orthopnea pnd or arm/hand swelling  or leg swelling, presyncope, palpitations, abdominal pain, anorexia, nausea, vomiting, diarrhea  or change in bowel habits or change in bladder habits, change in stools or change in urine, dysuria, hematuria,  rash, arthralgias, visual complaints, headache, numbness, weakness or ataxia or problems with walking or coordination,  change in mood or  memory.        Current Meds  Medication Sig  . aspirin 81 MG EC tablet Take 81 mg by  mouth daily.    . baclofen (LIORESAL) 10 MG tablet TAKE 1 TABLET(10 MG) BY MOUTH TWICE DAILY AS NEEDED FOR MUSCLE SPASMS  . calcium-vitamin D (OSCAL WITH D) 500-200 MG-UNIT per tablet Take 1 tablet by mouth daily.  . cholecalciferol (VITAMIN D) 1000 UNITS tablet Take 1,000 Units by mouth daily.    . clotrimazole-betamethasone (LOTRISONE) cream Apply 1 application topically 2 (two) times daily as needed.  . diclofenac sodium (VOLTAREN) 1 % GEL Apply 4 g topically 4 (four) times daily as needed.  . diphenhydrAMINE (BENADRYL) 25 MG tablet Take 25 mg by mouth every 6 (six) hours as needed (for bee stings).   . fluticasone (FLONASE) 50 MCG/ACT nasal spray Place 2 sprays into  both nostrils daily.  Marland Kitchen loratadine (CLARITIN) 10 MG tablet Take 10 mg by mouth daily.  . meloxicam (MOBIC) 15 MG tablet TAKE 1 TABLET(15 MG) BY MOUTH DAILY  . Multiple Vitamin (MULTIVITAMIN) capsule Take 1 capsule by mouth daily.    . naproxen sodium (ANAPROX) 220 MG tablet Take 440 mg by mouth daily as needed (for arthritis pain).  Marland Kitchen omeprazole (PRILOSEC) 20 MG capsule TAKE 1 CAPSULE(20 MG) BY MOUTH DAILY  . polyethylene glycol (MIRALAX / GLYCOLAX) packet take 1 packet once daily if needed for constipation  . pseudoephedrine (SUDAFED) 30 MG tablet Take 30 mg by mouth every 4 (four) hours as needed for congestion.   . Tiotropium Bromide Monohydrate (SPIRIVA RESPIMAT) 2.5 MCG/ACT AERS Inhale 2 puffs into the lungs daily.  Marland Kitchen zolpidem (AMBIEN) 10 MG tablet TAKE 1 TABLET BY MOUTH AT BEDTIME AS NEEDED  . [  Tiotropium Bromide Monohydrate (SPIRIVA RESPIMAT) 2.5 MCG/ACT AERS Inhale 2 puffs into the lungs daily.                           Objective:   Physical Exam    amb wf nad   12/01/2017      171   08/27/16 157 lb (71.2 kg)  05/14/16 163 lb (73.9 kg)  01/14/16 160 lb (72.6 kg)     Vital signs reviewed - Note on arrival 02 sats  100% on RA       HEENT: nl dentition / oropharynx. Nl external ear canals without cough reflex -  Mild bilateral non-specific turbinate edema     NECK :  without JVD/Nodes/TM/ nl carotid upstrokes bilaterally   LUNGS: no acc muscle use,  Mild barrel  contour chest wall with bilateral  Distant bs s audible wheeze and  without cough on insp or exp maneuver and mild  Hyperresonant  to  percussion bilaterally     CV:  RRR  no s3 or murmur or increase in P2, and no edema   ABD:  soft and nontender with pos late  insp Hoover's  in the supine position. No bruits or organomegaly appreciated, bowel sounds nl  MS:   Nl gait/  ext warm without deformities, calf tenderness, cyanosis or clubbing No obvious joint restrictions   SKIN: warm and dry without  lesions    NEURO:  alert, approp, nl sensorium with  no motor or cerebellar deficits apparent.            Assessment:

## 2017-12-01 NOTE — Patient Instructions (Signed)
No change in medications   Let me know if you are losing ground with exercise tolerance    Please schedule a follow up visit in 12 months but call sooner if needed

## 2017-12-02 ENCOUNTER — Encounter: Payer: Self-pay | Admitting: Internal Medicine

## 2017-12-02 NOTE — Assessment & Plan Note (Signed)
Quit smoking 01/2016 - PFT's  08/27/2016  FEV1 1.83 (86 % ) ratio 63  p 2 % improvement from saba p nothing prior to study with DLCO  43/45c % corrects to 60  % for alv volume  - 08/27/2016   Walked RA  2 laps @ 185 ft each stopped due to legs gave out walking fast pace, no sob or desat  - 08/27/2016  After extensive coaching device  effectiveness =    75% > try spiriva respimat 2.5 mg x 2 pffs each am   She has relatively mild copd and doing fine on spiriva 2.5 x 2 each am   Formulary restrictions will be an ongoing challenge for the forseable future and I would be happy to pick an alternative if the pt will first  provide me a list of them but pt  will need to return here for training for any new device that is required eg dpi vs hfa vs respimat.    In meantime we can always provide samples so the patient never runs out of any needed respiratory medications.   F/u can be yearly

## 2017-12-03 DIAGNOSIS — M79641 Pain in right hand: Secondary | ICD-10-CM | POA: Diagnosis not present

## 2017-12-10 DIAGNOSIS — Z4789 Encounter for other orthopedic aftercare: Secondary | ICD-10-CM | POA: Diagnosis not present

## 2017-12-10 DIAGNOSIS — M79641 Pain in right hand: Secondary | ICD-10-CM | POA: Diagnosis not present

## 2017-12-10 DIAGNOSIS — M13841 Other specified arthritis, right hand: Secondary | ICD-10-CM | POA: Diagnosis not present

## 2017-12-10 NOTE — Progress Notes (Signed)
[name] [address], [place] [address] Phone: [phone] Subjective:    I Stacey [name] am serving as a Education administrator for Dr. Hulan Saas.   CC: Left shoulder pain  [id number]  Stacey [name] is a 77 y.o. female coming in with complaint of left shoulder pain. States she needs an injection today. Was given an injection last visit.  Patient states that having worsening pain again.  Rates it as 7 out of 10.  Seems to be more around the shoulder and the neck itself.  Does have some radicular symptoms going down the hand though.  Patient did have surgery on her right hand so she is been using her left arm on a more regular basis.      Past Medical History:  Diagnosis Date  . ANEMIA-NOS   . ANXIETY   . Breast cancer ([facility]) 1997 L, 2012 R   s/p chemo/xrt  . COPD    resolved  . DIVERTICULOSIS, COLON 2008  . Dizziness   . GERD   . Hx of radiation therapy 10/19/11 -12/03/11   right breast  . HYPERLIPIDEMIA   . IRRITABLE BOWEL SYNDROME, HX OF   . Left-sided carotid artery disease ([facility])    moderate left ICA stenosis  . OSTEOARTHRITIS, HAND   . PSVT (paroxysmal supraventricular tachycardia) ([facility])    symptomatic on event monitor   Past Surgical History:  Procedure Laterality Date  . ABDOMINAL HYSTERECTOMY    . APPENDECTOMY    . BREAST BIOPSY  11/14/10    r breast: inv, insitu mammary carcinoma w/calcif, er/pr +, her2 -  . BREAST SURGERY     lumpectomy  . CATARACT EXTRACTION     both eyes  . ELECTROPHYSIOLOGIC STUDY N/A 05/10/2015   Procedure: SVT Ablation;  Surgeon: Will Meredith Leeds, MD;  Location: [facility];  Service: Cardiovascular;  Laterality: N/A;  . HERNIA REPAIR    . inguinal herniorrhapy  1984   left  . rectal fissure repair    . s/p benign breast biopsy  2003   right  . s/p left foot surgury  2009  . 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 spiral fx left foot  2008   no surgury  . TMJ ARTHROPLASTY  1989  .  [place]- to remove scar tissue growth   . TONSILLECTOMY     Social History   Socioeconomic History  . Marital status: Divorced    Spouse name: Not on file  . Number of children: 0  . Years of education: Not on file  . Highest education level: Not on file  Occupational History  . Occupation: retired Administrator, sports  . Financial resource strain: Not on file  . Food insecurity:    Worry: Not on file    Inability: Not on file  . Transportation needs:    Medical: Not on file    Non-medical: Not on file  Tobacco Use  . Smoking status: Former Smoker    Packs/day: 1.00    Years: 54.00    Pack years: 54.00    Types: Cigarettes    Last attempt to quit: 01/03/2016    Years since quitting: 1.9  . Smokeless tobacco: Never Used  Substance and Sexual Activity  . Alcohol use: Yes    Comment: rare/ drinks socially  . Drug use: No  . Sexual activity: Not on file  Lifestyle  .  Physical activity:    Days per week: Not on file    Minutes per session: Not on file  . Stress: Not on file  Relationships  . Social connections:    Talks on phone: Not on file    Gets together: Not on file    Attends religious service: Not on file    Active member of club or organization: Not on file    Attends meetings of clubs or organizations: Not on file    Relationship status: Not on file  Other Topics Concern  . Not on file  Social History Narrative   Patient gets no regular exercise   No biological children   2 step children   Allergies  Allergen Reactions  . Bee Venom Swelling  . Clindamycin/Lincomycin Diarrhea and Nausea And Vomiting  . Codeine Nausea And Vomiting   Family History  Problem Relation Age of Onset  . Alcohol abuse Mother        ETOH dependence  . Hypertension Mother   . Stroke Mother   . Colon polyps Mother   . Diabetes Mother   . Pancreatic cancer Mother 28  . Uterine cancer Mother 38  . Lymphoma Brother        burkitts  . Lung cancer Paternal  Uncle   . Lung cancer Maternal Grandmother 77       non-smoker  . Lung cancer Maternal Grandfather   . Breast cancer Cousin        maternal cousin, dx in her mid 20s  . Brain cancer Cousin        maternal cousin's son; dx in his 5s  . Testicular cancer Cousin        maternal cousin's son;   . Breast cancer Cousin        paternal cousin; dx in her 47s  . Breast cancer Cousin        1 maternal, 2 paternal  . Colon cancer Neg Hx   . Esophageal cancer Neg Hx   . Stomach cancer Neg Hx   . Rectal cancer Neg Hx       Current Outpatient Medications (Respiratory):  .  diphenhydrAMINE (BENADRYL) 25 MG tablet, Take 25 mg by mouth every 6 (six) hours as needed (for bee stings).  .  fluticasone (FLONASE) 50 MCG/ACT nasal spray, Place 2 sprays into both nostrils daily. Marland Kitchen  loratadine (CLARITIN) 10 MG tablet, Take 10 mg by mouth daily. .  pseudoephedrine (SUDAFED) 30 MG tablet, Take 30 mg by mouth every 4 (four) hours as needed for congestion.  .  Tiotropium Bromide Monohydrate (SPIRIVA RESPIMAT) 2.5 MCG/ACT AERS, Inhale 2 puffs into the lungs daily.  Current Outpatient Medications (Analgesics):  .  aspirin 81 MG EC tablet, Take 81 mg by mouth daily.   .  meloxicam (MOBIC) 15 MG tablet, TAKE 1 TABLET(15 MG) BY MOUTH DAILY .  naproxen sodium (ANAPROX) 220 MG tablet, Take 440 mg by mouth daily as needed (for arthritis pain).   Current Outpatient Medications (Other):  .  calcium-vitamin D (OSCAL WITH D) 500-200 MG-UNIT per tablet, Take 1 tablet by mouth daily. .  cholecalciferol (VITAMIN D) 1000 UNITS tablet, Take 1,000 Units by mouth daily.   .  clotrimazole-betamethasone (LOTRISONE) cream, Apply 1 application topically 2 (two) times daily as needed. .  diclofenac sodium (VOLTAREN) 1 % GEL, Apply 4 g topically 4 (four) times daily as needed. .  Multiple Vitamin (MULTIVITAMIN) capsule, Take 1 capsule by mouth daily.   Marland Kitchen  omeprazole (PRILOSEC) 20 MG capsule, TAKE 1 CAPSULE(20 MG) BY MOUTH  DAILY .  polyethylene glycol (MIRALAX / GLYCOLAX) packet, take 1 packet once daily if needed for constipation .  zolpidem (AMBIEN) 10 MG tablet, TAKE 1 TABLET BY MOUTH AT BEDTIME AS NEEDED    Past medical history, social, surgical and family history all reviewed in electronic medical record.  No pertanent information unless stated regarding to the chief complaint.   Review of Systems:  No headache, visual changes, nausea, vomiting, diarrhea, constipation, dizziness, abdominal pain, skin rash, fevers, chills, night sweats, weight loss, swollen lymph nodes,  chest pain, shortness of breath, mood changes.  Positive muscle aches, body aches  Objective  Blood pressure (!) 142/70, pulse (!) 104, height '5\' 2"'  (1.575 m), weight 173 lb (78.5 kg), SpO2 94 %.   General: No apparent distress alert and oriented x3 mood and affect normal, dressed appropriately.  HEENT: Pupils equal, extraocular movements intact  Respiratory: Patient's speak in full sentences and does not appear short of breath  Cardiovascular: No lower extremity edema, non tender, no erythema upper extremity swelling noted Skin: Warm dry intact with no signs of infection or rash on extremities or on axial skeleton.  Abdomen: Soft nontender  Neuro: Cranial nerves II through XII are intact, neurovascularly intact in all extremities with 2+ DTRs and 2+ pulses.  Lymph: No lymphadenopathy of posterior or anterior cervical chain or axillae bilaterally.  Swelling of both upper extremities, patient has had surgery Gait antalgic MSK: Arthritic changes of multiple joints Patient's shoulder does have some crepitus noted.  Decreased range of motion in all planes.  Patient does have 4-5 strength.  Unable to compared to the contralateral side with patient having recent hand surgery.   Procedure: Real-time Ultrasound Guided Injection of left glenohumeral joint Device: GE Logiq E  Ultrasound guided injection is preferred based studies that show  increased duration, increased effect, greater accuracy, decreased procedural pain, increased response rate with ultrasound guided versus blind injection.  Verbal informed consent obtained.  Time-out conducted.  Noted no overlying erythema, induration, or other signs of local infection.  Skin prepped in a sterile fashion.  Local anesthesia: Topical Ethyl chloride.  With sterile technique and under real time ultrasound guidance:  Joint visualized.  21g 2 inch needle inserted posterior approach. Pictures taken for needle placement. Patient did have injection of 2 cc of 0.5% Marcaine, and 1cc of Kenalog 40 mg/dL. Completed without difficulty  Pain immediately resolved suggesting accurate placement of the medication.  Advised to call if fevers/chills, erythema, induration, drainage, or persistent bleeding.  Images permanently stored and available for review in the ultrasound unit.  Impression: Technically successful ultrasound guided injection.    Impression and Recommendations:      The above documentation has been reviewed and is accurate and complete Lyndal Pulley, DO       Note: This dictation was prepared with Dragon dictation along with smaller phrase technology. Any transcriptional errors that result from this process are unintentional.

## 2017-12-13 ENCOUNTER — Ambulatory Visit: Payer: Self-pay

## 2017-12-13 ENCOUNTER — Encounter: Payer: Self-pay | Admitting: Family Medicine

## 2017-12-13 ENCOUNTER — Ambulatory Visit (INDEPENDENT_AMBULATORY_CARE_PROVIDER_SITE_OTHER): Payer: Medicare Other | Admitting: Family Medicine

## 2017-12-13 VITALS — BP 142/70 | HR 104 | Ht 62.0 in | Wt 173.0 lb

## 2017-12-13 DIAGNOSIS — M12812 Other specific arthropathies, not elsewhere classified, left shoulder: Secondary | ICD-10-CM

## 2017-12-13 DIAGNOSIS — G8929 Other chronic pain: Secondary | ICD-10-CM

## 2017-12-13 DIAGNOSIS — M25512 Pain in left shoulder: Secondary | ICD-10-CM

## 2017-12-13 DIAGNOSIS — I6522 Occlusion and stenosis of left carotid artery: Secondary | ICD-10-CM

## 2017-12-13 NOTE — Assessment & Plan Note (Signed)
Repeat injection given today.  Discussed icing regimen and home exercise.  Discussed which activities doing which would avoid.  Patient can have repeat injections every 10 weeks.  Do not feel that further imaging is necessary at this time.  If any significant changes though with patient's past medical history for the breast cancer will consider.  Most recent x-rays did not show any significant changes.  Follow-up again in 10 weeks spent  25 minutes with patient face-to-face and had greater than 50% of counseling including as described above in assessment and plan.

## 2017-12-13 NOTE — Patient Instructions (Signed)
Good to see you  I am sorry this has been an ordeal  Injected the shoulder today  I hope it helps Can repeat every 10 weeks  See me again in 10 weeks

## 2017-12-17 DIAGNOSIS — M79641 Pain in right hand: Secondary | ICD-10-CM | POA: Diagnosis not present

## 2017-12-23 DIAGNOSIS — Z853 Personal history of malignant neoplasm of breast: Secondary | ICD-10-CM | POA: Diagnosis not present

## 2017-12-23 LAB — HM MAMMOGRAPHY

## 2017-12-24 DIAGNOSIS — M79641 Pain in right hand: Secondary | ICD-10-CM | POA: Diagnosis not present

## 2017-12-25 ENCOUNTER — Other Ambulatory Visit: Payer: Self-pay | Admitting: Internal Medicine

## 2017-12-29 ENCOUNTER — Other Ambulatory Visit: Payer: Self-pay | Admitting: Internal Medicine

## 2017-12-29 DIAGNOSIS — M79641 Pain in right hand: Secondary | ICD-10-CM | POA: Diagnosis not present

## 2018-01-05 DIAGNOSIS — H40013 Open angle with borderline findings, low risk, bilateral: Secondary | ICD-10-CM | POA: Diagnosis not present

## 2018-01-05 DIAGNOSIS — I708 Atherosclerosis of other arteries: Secondary | ICD-10-CM | POA: Diagnosis not present

## 2018-01-05 DIAGNOSIS — H353112 Nonexudative age-related macular degeneration, right eye, intermediate dry stage: Secondary | ICD-10-CM | POA: Diagnosis not present

## 2018-01-05 DIAGNOSIS — H353121 Nonexudative age-related macular degeneration, left eye, early dry stage: Secondary | ICD-10-CM | POA: Diagnosis not present

## 2018-01-06 DIAGNOSIS — M79641 Pain in right hand: Secondary | ICD-10-CM | POA: Diagnosis not present

## 2018-01-06 DIAGNOSIS — M13841 Other specified arthritis, right hand: Secondary | ICD-10-CM | POA: Diagnosis not present

## 2018-01-06 DIAGNOSIS — Z4789 Encounter for other orthopedic aftercare: Secondary | ICD-10-CM | POA: Diagnosis not present

## 2018-02-21 NOTE — Progress Notes (Signed)
[name] [address], [place] [address] Phone: [phone] Subjective:   [name], am serving as a scribe for Dr. Hulan Saas.  CC: Left wrist pain  ONG:[id number]  Stacey [name] is a 78 y.o. female coming in with complaint of left shoulder pain. Patient states that shoulder is doing fine since last injection. Is here to discuss constant finger numbness in thumb and 2nd and 3rd fingers of the left hand. Has had symptoms for 6 months. Is experiencing weakness in her hand.  Pain in surgery on the right hand.  States that the knuckle still gives her difficulty but is making some improvement on the right side.  Left side no splints and is however constant numbness at this point.  Affecting her grip strength     Past Medical History:  Diagnosis Date  . ANEMIA-NOS   . ANXIETY   . Breast cancer ([facility]) 1997 L, 2012 R   s/p chemo/xrt  . COPD    resolved  . DIVERTICULOSIS, COLON 2008  . Dizziness   . GERD   . Hx of radiation therapy 10/19/11 -12/03/11   right breast  . HYPERLIPIDEMIA   . IRRITABLE BOWEL SYNDROME, HX OF   . Left-sided carotid artery disease (HCC)    moderate left ICA stenosis  . OSTEOARTHRITIS, HAND   . PSVT (paroxysmal supraventricular tachycardia) (HCC)    symptomatic on event monitor   Past Surgical History:  Procedure Laterality Date  . ABDOMINAL HYSTERECTOMY    . APPENDECTOMY    . BREAST BIOPSY  11/14/10    r breast: inv, insitu mammary carcinoma w/calcif, er/pr +, her2 -  . BREAST SURGERY     lumpectomy  . CATARACT EXTRACTION     both eyes  . ELECTROPHYSIOLOGIC STUDY N/A 05/10/2015   Procedure: SVT Ablation;  Surgeon: Will Meredith Leeds, MD;  Location: [facility];  Service: Cardiovascular;  Laterality: N/A;  . HERNIA REPAIR    . inguinal herniorrhapy  1984   left  . rectal fissure repair    . s/p benign breast biopsy  2003   right  . s/p left foot surgury  2009  . 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 spiral fx left foot  2008   no surgury  . TMJ ARTHROPLASTY  1989  . [place]- to remove scar tissue growth   . TONSILLECTOMY     Social History   Socioeconomic History  . Marital status: Divorced    Spouse name: Not on file  . Number of children: 0  . Years of education: Not on file  . Highest education level: Not on file  Occupational History  . Occupation: retired Administrator, sports  . Financial resource strain: Not on file  . Food insecurity:    Worry: Not on file    Inability: Not on file  . Transportation needs:    Medical: Not on file    Non-medical: Not on file  Tobacco Use  . Smoking status: Former Smoker    Packs/day: 1.00    Years: 54.00    Pack years: 54.00    Types: Cigarettes    Last attempt to quit: 01/03/2016    Years since quitting: 2.1  . Smokeless tobacco: Never Used  Substance and Sexual Activity  . Alcohol use: Yes    Comment: rare/ drinks socially  . Drug use: No  .  Sexual activity: Not on file  Lifestyle  . Physical activity:    Days per week: Not on file    Minutes per session: Not on file  . Stress: Not on file  Relationships  . Social connections:    Talks on phone: Not on file    Gets together: Not on file    Attends religious service: Not on file    Active member of club or organization: Not on file    Attends meetings of clubs or organizations: Not on file    Relationship status: Not on file  Other Topics Concern  . Not on file  Social History Narrative   Patient gets no regular exercise   No biological children   2 step children   Allergies  Allergen Reactions  . Bee Venom Swelling  . Clindamycin/Lincomycin Diarrhea and Nausea And Vomiting  . Codeine Nausea And Vomiting   Family History  Problem Relation Age of Onset  . Alcohol abuse Mother        ETOH dependence  . Hypertension Mother   . Stroke Mother   . Colon polyps Mother   . Diabetes Mother   . Pancreatic cancer Mother 23  .  Uterine cancer Mother 32  . Lymphoma Brother        burkitts  . Lung cancer Paternal Uncle   . Lung cancer Maternal Grandmother 74       non-smoker  . Lung cancer Maternal Grandfather   . Breast cancer Cousin        maternal cousin, dx in her mid 94s  . Brain cancer Cousin        maternal cousin's son; dx in his 63s  . Testicular cancer Cousin        maternal cousin's son;   . Breast cancer Cousin        paternal cousin; dx in her 87s  . Breast cancer Cousin        1 maternal, 2 paternal  . Colon cancer Neg Hx   . Esophageal cancer Neg Hx   . Stomach cancer Neg Hx   . Rectal cancer Neg Hx       Current Outpatient Medications (Respiratory):  .  diphenhydrAMINE (BENADRYL) 25 MG tablet, Take 25 mg by mouth every 6 (six) hours as needed (for bee stings).  .  fluticasone (FLONASE) 50 MCG/ACT nasal spray, Place 2 sprays into both nostrils daily. Marland Kitchen  loratadine (CLARITIN) 10 MG tablet, Take 10 mg by mouth daily. .  pseudoephedrine (SUDAFED) 30 MG tablet, Take 30 mg by mouth every 4 (four) hours as needed for congestion.  .  Tiotropium Bromide Monohydrate (SPIRIVA RESPIMAT) 2.5 MCG/ACT AERS, Inhale 2 puffs into the lungs daily.  Current Outpatient Medications (Analgesics):  .  aspirin 81 MG EC tablet, Take 81 mg by mouth daily.   .  meloxicam (MOBIC) 15 MG tablet, TAKE 1 TABLET(15 MG) BY MOUTH DAILY .  naproxen sodium (ANAPROX) 220 MG tablet, Take 440 mg by mouth daily as needed (for arthritis pain).   Current Outpatient Medications (Other):  .  calcium-vitamin D (OSCAL WITH D) 500-200 MG-UNIT per tablet, Take 1 tablet by mouth daily. .  cholecalciferol (VITAMIN D) 1000 UNITS tablet, Take 1,000 Units by mouth daily.   .  clotrimazole-betamethasone (LOTRISONE) cream, Apply 1 application topically 2 (two) times daily as needed. .  diclofenac sodium (VOLTAREN) 1 % GEL, Apply 4 g topically 4 (four) times daily as needed. .  Multiple Vitamin (MULTIVITAMIN) capsule, Take  1 capsule by  mouth daily.   Marland Kitchen  omeprazole (PRILOSEC) 20 MG capsule, TAKE 1 CAPSULE(20 MG) BY MOUTH DAILY .  polyethylene glycol (MIRALAX / GLYCOLAX) packet, take 1 packet once daily if needed for constipation .  zolpidem (AMBIEN) 10 MG tablet, TAKE 1 TABLET BY MOUTH AT BEDTIME AS NEEDED    Past medical history, social, surgical and family history all reviewed in electronic medical record.  No pertanent information unless stated regarding to the chief complaint.   Review of Systems:  No headache, visual changes, nausea, vomiting, diarrhea, constipation, dizziness, abdominal pain, skin rash, fevers, chills, night sweats, weight loss, swollen lymph nodes, body aches, joint swelling, chest pain, shortness of breath, mood changes.  Positive muscle aches  Objective  Blood pressure 118/86, height '5\' 2"'  (1.575 m), weight 170 lb (77.1 kg).   General: No apparent distress alert and oriented x3 mood and affect normal, dressed appropriately.  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  Abdomen: Soft nontender  Neuro: Cranial nerves II through XII are intact, neurovascularly intact in all extremities with 2+ DTRs and 2+ pulses.  Lymph: No lymphadenopathy of posterior or anterior cervical chain or axillae bilaterally.  Gait antalgic MSK:  tender with limited range of motion and stability and  strength and tone of shoulders, elbows, , hip, knee and ankles bilaterally.   Left wrist exam shows the patient does have some mild thenar eminence wasting.  Positive Tinel sign.  Mild limited range of motion of less than 5 degrees in all planes.  Positive Phalen's test on the left as well.  Right hand exam in recent surgery did not  make any significant changes but neurovascularly intact distally.  After verbal consent patient was prepped with alcohol swab and with a 25-gauge half inch needle injected with 0.5 cc of 0.5% Marcaine and 0.5 cc of Kenalog 40 mg/mL into the median nerve sheath on the palmar aspect of the wrist.    Impression and Recommendations:     This case required medical decision making of moderate complexity. The above documentation has been reviewed and is accurate and complete Lyndal Pulley, DO       Note: This dictation was prepared with Dragon dictation along with smaller phrase technology. Any transcriptional errors that result from this process are unintentional.

## 2018-02-22 ENCOUNTER — Ambulatory Visit (INDEPENDENT_AMBULATORY_CARE_PROVIDER_SITE_OTHER): Payer: Medicare Other | Admitting: Family Medicine

## 2018-02-22 ENCOUNTER — Encounter: Payer: Self-pay | Admitting: Family Medicine

## 2018-02-22 DIAGNOSIS — G5602 Carpal tunnel syndrome, left upper limb: Secondary | ICD-10-CM

## 2018-02-22 NOTE — Patient Instructions (Signed)
Good to see you  Injected the carpal tunnel today  Will be numb for up to a day  Exercises 3 times a week.  Write me in 3 weeks and if not better then we will order epidural in the neck  If we do the epidural in the neck then I would want to see you again in 3ish weeks AFTER the injection

## 2018-02-22 NOTE — Assessment & Plan Note (Signed)
Patient given injection.  Tolerated the procedure well.  Discussed icing regimen.  Bracing at night.  Home exercises given.  Worsening symptoms may need EMG to further evaluate.  Differential includes cervical radiculopathy but patient did not seem to be as much painful with the neck.  Follow-up again in 4 to 6 weeks

## 2018-03-12 ENCOUNTER — Other Ambulatory Visit: Payer: Self-pay | Admitting: Internal Medicine

## 2018-03-14 ENCOUNTER — Telehealth: Payer: Self-pay | Admitting: Internal Medicine

## 2018-03-14 MED ORDER — ZOLPIDEM TARTRATE 10 MG PO TABS: 10.0000 mg | ORAL_TABLET | Freq: Every evening | ORAL | 0 refills | Status: DC | PRN

## 2018-03-14 NOTE — Telephone Encounter (Signed)
Done erx  Please remind pt she would be due for yearly visit in march 2020

## 2018-03-21 ENCOUNTER — Ambulatory Visit (HOSPITAL_COMMUNITY)
Admission: RE | Admit: 2018-03-21 | Discharge: 2018-03-21 | Disposition: A | Discharge status: Home / Self Care | Payer: Medicare Other | Source: Ambulatory Visit | Attending: Cardiovascular Disease | Admitting: Cardiovascular Disease

## 2018-03-21 DIAGNOSIS — I6522 Occlusion and stenosis of left carotid artery: Secondary | ICD-10-CM | POA: Diagnosis not present

## 2018-03-24 ENCOUNTER — Other Ambulatory Visit: Payer: Self-pay | Admitting: Internal Medicine

## 2018-04-06 DIAGNOSIS — R200 Anesthesia of skin: Secondary | ICD-10-CM | POA: Diagnosis not present

## 2018-04-06 DIAGNOSIS — M79641 Pain in right hand: Secondary | ICD-10-CM | POA: Diagnosis not present

## 2018-04-22 ENCOUNTER — Ambulatory Visit: Payer: Medicare Other

## 2018-05-02 ENCOUNTER — Telehealth: Payer: Self-pay | Admitting: *Deleted

## 2018-05-02 NOTE — Telephone Encounter (Signed)
Called patient and LVM to inform them the nurse needs to either convert their upcoming AWV to a virtual visit or reschedule the visit out into the future due to covid-19 safety measures. Nurse requested that the patient call-back and stated she would call them back at a later date.    

## 2018-05-06 ENCOUNTER — Telehealth: Payer: Self-pay | Admitting: Internal Medicine

## 2018-05-06 MED ORDER — ZOLPIDEM TARTRATE 10 MG PO TABS: 10.0000 mg | ORAL_TABLET | Freq: Every evening | ORAL | 0 refills | Status: DC | PRN

## 2018-05-06 NOTE — Telephone Encounter (Signed)
Done erx 

## 2018-05-19 NOTE — Progress Notes (Addendum)
Subjective:   Stacey [name] is a 78 y.o. female who presents for an Initial Medicare Annual Wellness Visit.  I connected with patient 05/20/18 at  2:00 PM EDT by a video enabled telemedicine application and verified that I am speaking with the correct person using two identifiers. Patient stated full name and DOB. Patient gave permission to continue with virtual visit. Patient's location was at home and Nurse's location was at [place] office.   Review of Systems    No ROS.  Medicare Wellness Visit. Additional risk factors are reflected in the social history.   Cardiac Risk Factors include: advanced age (>32mn, >>56women);dyslipidemia Sleep patterns: no sleep issues, feels rested on waking, gets up 0-1 times nightly to void and sleeps 8-9 hours nightly.   Home Safety/Smoke Alarms: Feels safe in home. Smoke alarms in place.  Living environment; residence and Firearm Safety: 1-story house/ trailer. Lives alone, no needs for DME, good support system Seat Belt Safety/Bike Helmet: Wears seat belt.      Objective:    Today's Vitals   05/20/18 1535  PainSc: 2    There is no height or weight on file to calculate BMI.  Advanced Directives 05/20/2018 06/01/2017 12/19/2015 05/10/2015 12/19/2014 06/06/2014 12/19/2013  Does Patient Have a Medical Advance Directive? Yes No Yes Yes Yes Yes Yes  Type of AParamedicof ABelgradeLiving will - Living will HMiltonLiving will Living will - HOsageLiving will  Does patient want to make changes to medical advance directive? - - - No - Patient declined - - -  Copy of [place] Chart? No - copy requested - - - - - -    Current Medications (verified) Outpatient Encounter Medications as of 05/20/2018  Medication Sig  . aspirin 81 MG EC tablet Take 81 mg by mouth daily.    . calcium-vitamin D (OSCAL WITH D) 500-200 MG-UNIT per tablet Take 1 tablet by mouth daily.  .  cholecalciferol (VITAMIN D) 1000 UNITS tablet Take 1,000 Units by mouth daily.    . clotrimazole-betamethasone (LOTRISONE) cream Apply 1 application topically 2 (two) times daily as needed.  . diclofenac sodium (VOLTAREN) 1 % GEL Apply 4 g topically 4 (four) times daily as needed.  . diphenhydrAMINE (BENADRYL) 25 MG tablet Take 25 mg by mouth every 6 (six) hours as needed (for bee stings).   . fluticasone (FLONASE) 50 MCG/ACT nasal spray Place 2 sprays into both nostrils daily.  .Marland Kitchenloratadine (CLARITIN) 10 MG tablet Take 10 mg by mouth daily.  . meloxicam (MOBIC) 15 MG tablet TAKE 1 TABLET(15 MG) BY MOUTH DAILY  . Multiple Vitamin (MULTIVITAMIN) capsule Take 1 capsule by mouth daily.    .Marland Kitchenomeprazole (PRILOSEC) 20 MG capsule TAKE 1 CAPSULE(20 MG) BY MOUTH DAILY  . polyethylene glycol (MIRALAX / GLYCOLAX) packet take 1 packet once daily if needed for constipation  . pseudoephedrine (SUDAFED) 30 MG tablet Take 30 mg by mouth every 4 (four) hours as needed for congestion.   . Tiotropium Bromide Monohydrate (SPIRIVA RESPIMAT) 2.5 MCG/ACT AERS Inhale 2 puffs into the lungs daily.  .Marland Kitchenzolpidem (AMBIEN) 10 MG tablet Take 1 tablet (10 mg total) by mouth at bedtime as needed.  . [DISCONTINUED] naproxen sodium (ANAPROX) 220 MG tablet Take 440 mg by mouth daily as needed (for arthritis pain).   No facility-administered encounter medications on file as of 05/20/2018.     Allergies (verified) Bee venom; Clindamycin/lincomycin; and Codeine  History: Past Medical History:  Diagnosis Date  . ANEMIA-NOS   . ANXIETY   . Breast cancer ([facility]) 1997 L, 2012 R   s/p chemo/xrt  . COPD    resolved  . DIVERTICULOSIS, COLON 2008  . Dizziness   . GERD   . Hx of radiation therapy 10/19/11 -12/03/11   right breast  . HYPERLIPIDEMIA   . IRRITABLE BOWEL SYNDROME, HX OF   . Left-sided carotid artery disease (HCC)    moderate left ICA stenosis  . OSTEOARTHRITIS, HAND   . PSVT (paroxysmal supraventricular  tachycardia) (HCC)    symptomatic on event monitor   Past Surgical History:  Procedure Laterality Date  . ABDOMINAL HYSTERECTOMY    . APPENDECTOMY    . BREAST BIOPSY  11/14/10    r breast: inv, insitu mammary carcinoma w/calcif, er/pr +, her2 -  . BREAST SURGERY     lumpectomy  . CATARACT EXTRACTION     both eyes  . ELECTROPHYSIOLOGIC STUDY N/A 05/10/2015   Procedure: SVT Ablation;  Surgeon: Will Meredith Leeds, MD;  Location: [facility];  Service: Cardiovascular;  Laterality: N/A;  . HERNIA REPAIR    . inguinal herniorrhapy  1984   left  . rectal fissure repair    . s/p benign breast biopsy  2003   right  . s/p left foot surgury  2009  . 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 spiral fx left foot  2008   no surgury  . TMJ ARTHROPLASTY  1989  . [place]- to remove scar tissue growth   . TONSILLECTOMY     Family History  Problem Relation Age of Onset  . Alcohol abuse Mother        ETOH dependence  . Hypertension Mother   . Stroke Mother   . Colon polyps Mother   . Diabetes Mother   . Pancreatic cancer Mother 34  . Uterine cancer Mother 69  . Lymphoma Brother        burkitts  . Lung cancer Paternal Uncle   . Lung cancer Maternal Grandmother 72       non-smoker  . Lung cancer Maternal Grandfather   . Breast cancer Cousin        maternal cousin, dx in her mid 23s  . Brain cancer Cousin        maternal cousin's son; dx in his 7s  . Testicular cancer Cousin        maternal cousin's son;   . Breast cancer Cousin        paternal cousin; dx in her 49s  . Breast cancer Cousin        1 maternal, 2 paternal  . Colon cancer Neg Hx   . Esophageal cancer Neg Hx   . Stomach cancer Neg Hx   . Rectal cancer Neg Hx    Social History   Socioeconomic History  . Marital status: Divorced    Spouse name: 2 Step-children  . Number of children: 2  . Years of education: Not on file  . Highest education level: Not on file  Occupational History  .  Occupation: retired Administrator, sports  . Financial resource strain: Not hard at all  . Food insecurity:    Worry: Never true    Inability: Never true  . Transportation needs:    Medical: No    Non-medical: No  Tobacco Use  . Smoking status: Former Smoker  Packs/day: 1.00    Years: 54.00    Pack years: 54.00    Types: Cigarettes    Last attempt to quit: 01/03/2016    Years since quitting: 2.3  . Smokeless tobacco: Never Used  Substance and Sexual Activity  . Alcohol use: Yes    Comment: rare/ drinks socially  . Drug use: No  . Sexual activity: Never  Lifestyle  . Physical activity:    Days per week: 6 days    Minutes per session: 40 min  . Stress: Not at all  Relationships  . Social connections:    Talks on phone: More than three times a week    Gets together: More than three times a week    Attends religious service: More than 4 times per year    Active member of club or organization: Yes    Attends meetings of clubs or organizations: More than 4 times per year    Relationship status: Divorced  Other Topics Concern  . Not on file  Social History Narrative   Patient gets no regular exercise   No biological children   2 step children    Tobacco Counseling Counseling given: Not Answered  Activities of Daily Living In your present state of health, do you have any difficulty performing the following activities: 05/20/2018  Hearing? N  Vision? N  Difficulty concentrating or making decisions? N  Walking or climbing stairs? N  Dressing or bathing? N  Doing errands, shopping? N  Preparing Food and eating ? N  Using the Toilet? N  In the past six months, have you accidently leaked urine? N  Do you have problems with loss of bowel control? N  Managing your Medications? N  Managing your Finances? N  Housekeeping or managing your Housekeeping? N  Some recent data might be hidden     Immunizations and Health Maintenance Immunization History  Administered  Date(s) Administered  . Influenza Split 12/17/2011  . Influenza Whole 12/17/2006, 10/23/2008, 10/18/2010  . Influenza, High Dose Seasonal PF 11/16/2013, 11/26/2017  . Influenza-Unspecified 11/02/2012, 11/17/2014, 11/05/2015  . Pneumococcal Conjugate-13 12/15/2013  . Pneumococcal Polysaccharide-23 01/20/2006  . Td 06/14/2008   There are no preventive care reminders to display for this patient.  Patient Care Team: Biagio Borg, MD as PCP - Jolinda Croak Izola Price, MD as Consulting Physician (Internal Medicine) Tyler Pita, MD as Consulting Physician (Radiation Oncology) Roseanne Kaufman, MD as Consulting Physician (Orthopedic Surgery) Lyndal Pulley, DO as Consulting Physician (Family Medicine)  Indicate any recent Medical Services you may have received from other than [facility] providers in the past year (date may be approximate).     Assessment:   This is a routine wellness examination for [facility]. Physical assessment deferred to PCP.  Hearing/Vision screen Hearing Screening Comments: Able to hear conversational tones w/o difficulty. No issues reported.  Passed whisper test Vision Screening Comments: appointment yearly   Dietary issues and exercise activities discussed: Current Exercise Habits: Home exercise routine, Type of exercise: treadmill;walking, Time (Minutes): 40, Frequency (Times/Week): 5, Weekly Exercise (Minutes/Week): 200, Intensity: Mild, Exercise limited by: orthopedic condition(s)  Diet (meal preparation, eat out, water intake, caffeinated beverages, dairy products, fruits and vegetables): in general, a "healthy" diet  , well balanced eats a variety of fruits and vegetables daily, limits salt, fat/cholesterol, sugar,carbohydrates,caffeine, drinks 6-8 glasses of water daily.  Goals    . Patient Stated     Continue to stay physically and socially active. Stay as healthy and as independent as  possible.       Depression Screen PHQ 2/9 Scores 05/20/2018 04/09/2017  05/14/2016 07/05/2015 01/09/2015 12/15/2013  PHQ - 2 Score 0 0 0 0 0 0    Fall Risk Fall Risk  05/20/2018 04/09/2017 05/14/2016 07/05/2015 01/09/2015  Falls in the past year? 0 No No No Yes  Number falls in past yr: 0 - - - 1  Injury with Fall? - - - - Yes  Comment - - - - spriained left ankle, and lower back apin, with slip on  wet stair  Follow up Falls prevention discussed - - - -    Cognitive Function:       Ad8 score reviewed for issues:  Issues making decisions: no  Less interest in hobbies / activities: no  Repeats questions, stories (family complaining): no  Trouble using ordinary gadgets (microwave, computer, phone):no  Forgets the month or year: no  Mismanaging finances: no  Remembering appts: no  Daily problems with thinking and/or memory: no Ad8 score is= 0  Screening Tests Health Maintenance  Topic Date Due  . TETANUS/TDAP  06/15/2018  . INFLUENZA VACCINE  09/03/2018  . DEXA SCAN  Completed  . PNA vac Low Risk Adult  Completed     Plan:    Reviewed health maintenance screenings with patient today and relevant education, vaccines, and/or referrals were provided.   Continue doing brain stimulating activities (puzzles, reading, adult coloring books, staying active) to keep memory sharp.   Continue to eat heart healthy diet (full of fruits, vegetables, whole grains, lean protein, water--limit salt, fat, and sugar intake) and increase physical activity as tolerated.   I have personally reviewed and noted the following in the patient's chart:   . Medical and social history . Use of alcohol, tobacco or illicit drugs  . Current medications and supplements . Functional ability and status . Nutritional status . Physical activity . Advanced directives . List of other physicians . Vitals . Screenings to include cognitive, depression, and falls . Referrals and appointments  In addition, I have reviewed and discussed with patient certain preventive protocols,  quality metrics, and best practice recommendations. A written personalized care plan for preventive services as well as general preventive health recommendations were provided to patient.     Michiel Cowboy, RN   05/20/2018     Medical screening examination/treatment/procedure(s) were performed by non-physician practitioner and as supervising physician I was immediately available for consultation/collaboration. I agree with above. Cathlean Cower, MD

## 2018-05-20 ENCOUNTER — Ambulatory Visit (INDEPENDENT_AMBULATORY_CARE_PROVIDER_SITE_OTHER): Payer: Medicare Other | Admitting: *Deleted

## 2018-05-20 DIAGNOSIS — Z0000 Encounter for general adult medical examination without abnormal findings: Secondary | ICD-10-CM

## 2018-05-20 NOTE — Patient Instructions (Addendum)
Continue doing brain stimulating activities (puzzles, reading, adult coloring books, staying active) to keep memory sharp.   Continue to eat heart healthy diet (full of fruits, vegetables, whole grains, lean protein, water--limit salt, fat, and sugar intake) and increase physical activity as tolerated.   Stacey [name] , Thank you for taking time to come for your Medicare Wellness Visit. I appreciate your ongoing commitment to your health goals. Please review the following plan we discussed and let me know if I can assist you in the future.   These are the goals we discussed: Goals    . exercise     Continue to stay physically and socially active. Stay as healthy and as independent as possible.        This is a list of the screening recommended for you and due dates:  Health Maintenance  Topic Date Due  . Tetanus Vaccine  06/15/2018  . Flu Shot  09/03/2018  . DEXA scan (bone density measurement)  Completed  . Pneumonia vaccines  Completed    Health Maintenance, Female Adopting a healthy lifestyle and getting preventive care can go a long way to promote health and wellness. Talk with your health care provider about what schedule of regular examinations is right for you. This is a good chance for you to check in with your provider about disease prevention and staying healthy. In between checkups, there are plenty of things you can do on your own. Experts have done a lot of research about which lifestyle changes and preventive measures are most likely to keep you healthy. Ask your health care provider for more information. Weight and diet Eat a healthy diet  Be sure to include plenty of vegetables, fruits, low-fat dairy products, and lean protein.  Do not eat a lot of foods high in solid fats, added sugars, or salt.  Get regular exercise. This is one of the most important things you can do for your health. ? Most adults should exercise for at least 150 minutes each week. The exercise  should increase your heart rate and make you sweat (moderate-intensity exercise). ? Most adults should also do strengthening exercises at least twice a week. This is in addition to the moderate-intensity exercise. Maintain a healthy weight  Body mass index (BMI) is a measurement that can be used to identify possible weight problems. It estimates body fat based on height and weight. Your health care provider can help determine your BMI and help you achieve or maintain a healthy weight.  For females 57 years of age and older: ? A BMI below 18.5 is considered underweight. ? A BMI of 18.5 to 24.9 is normal. ? A BMI of 25 to 29.9 is considered overweight. ? A BMI of 30 and above is considered obese. Watch levels of cholesterol and blood lipids  You should start having your blood tested for lipids and cholesterol at 78 years of age, then have this test every 5 years.  You may need to have your cholesterol levels checked more often if: ? Your lipid or cholesterol levels are high. ? You are older than 78 years of age. ? You are at high risk for heart disease. Cancer screening Lung Cancer  Lung cancer screening is recommended for adults 68-60 years old who are at high risk for lung cancer because of a history of smoking.  A yearly low-dose CT scan of the lungs is recommended for people who: ? Currently smoke. ? Have quit within the past 15 years. ?  Have at least a 30-pack-year history of smoking. A pack year is smoking an average of one pack of cigarettes a day for 1 year.  Yearly screening should continue until it has been 15 years since you quit.  Yearly screening should stop if you develop a health problem that would prevent you from having lung cancer treatment. Breast Cancer  Practice breast self-awareness. This means understanding how your breasts normally appear and feel.  It also means doing regular breast self-exams. Let your health care provider know about any changes, no matter  how small.  If you are in your 20s or 30s, you should have a clinical breast exam (CBE) by a health care provider every 1-3 years as part of a regular health exam.  If you are 44 or older, have a CBE every year. Also consider having a breast X-ray (mammogram) every year.  If you have a family history of breast cancer, talk to your health care provider about genetic screening.  If you are at high risk for breast cancer, talk to your health care provider about having an MRI and a mammogram every year.  Breast cancer gene (BRCA) assessment is recommended for women who have family members with BRCA-related cancers. BRCA-related cancers include: ? Breast. ? Ovarian. ? Tubal. ? Peritoneal cancers.  Results of the assessment will determine the need for genetic counseling and BRCA1 and BRCA2 testing. Cervical Cancer Your health care provider may recommend that you be screened regularly for cancer of the pelvic organs (ovaries, uterus, and vagina). This screening involves a pelvic examination, including checking for microscopic changes to the surface of your cervix (Pap test). You may be encouraged to have this screening done every 3 years, beginning at age 25.  For women ages 41-65, health care providers may recommend pelvic exams and Pap testing every 3 years, or they may recommend the Pap and pelvic exam, combined with testing for human papilloma virus (HPV), every 5 years. Some types of HPV increase your risk of cervical cancer. Testing for HPV may also be done on women of any age with unclear Pap test results.  Other health care providers may not recommend any screening for nonpregnant women who are considered low risk for pelvic cancer and who do not have symptoms. Ask your health care provider if a screening pelvic exam is right for you.  If you have had past treatment for cervical cancer or a condition that could lead to cancer, you need Pap tests and screening for cancer for at least 20 years  after your treatment. If Pap tests have been discontinued, your risk factors (such as having a new sexual partner) need to be reassessed to determine if screening should resume. Some women have medical problems that increase the chance of getting cervical cancer. In these cases, your health care provider may recommend more frequent screening and Pap tests. Colorectal Cancer  This type of cancer can be detected and often prevented.  Routine colorectal cancer screening usually begins at 78 years of age and continues through 78 years of age.  Your health care provider may recommend screening at an earlier age if you have risk factors for colon cancer.  Your health care provider may also recommend using home test kits to check for hidden blood in the stool.  A small camera at the end of a tube can be used to examine your colon directly (sigmoidoscopy or colonoscopy). This is done to check for the earliest forms of colorectal cancer.  Routine  screening usually begins at age 31.  Direct examination of the colon should be repeated every 5-10 years through 78 years of age. However, you may need to be screened more often if early forms of precancerous polyps or small growths are found. Skin Cancer  Check your skin from head to toe regularly.  Tell your health care provider about any new moles or changes in moles, especially if there is a change in a mole's shape or color.  Also tell your health care provider if you have a mole that is larger than the size of a pencil eraser.  Always use sunscreen. Apply sunscreen liberally and repeatedly throughout the day.  Protect yourself by wearing long sleeves, pants, a wide-brimmed hat, and sunglasses whenever you are outside. Heart disease, diabetes, and high blood pressure  High blood pressure causes heart disease and increases the risk of stroke. High blood pressure is more likely to develop in: ? People who have blood pressure in the high end of the  normal range (130-139/85-89 mm Hg). ? People who are overweight or obese. ? People who are African American.  If you are 9-73 years of age, have your blood pressure checked every 3-5 years. If you are 49 years of age or older, have your blood pressure checked every year. You should have your blood pressure measured twice-once when you are at a hospital or clinic, and once when you are not at a hospital or clinic. Record the average of the two measurements. To check your blood pressure when you are not at a hospital or clinic, you can use: ? An automated blood pressure machine at a pharmacy. ? A home blood pressure monitor.  If you are between 44 years and 15 years old, ask your health care provider if you should take aspirin to prevent strokes.  Have regular diabetes screenings. This involves taking a blood sample to check your fasting blood sugar level. ? If you are at a normal weight and have a low risk for diabetes, have this test once every three years after 78 years of age. ? If you are overweight and have a high risk for diabetes, consider being tested at a younger age or more often. Preventing infection Hepatitis B  If you have a higher risk for hepatitis B, you should be screened for this virus. You are considered at high risk for hepatitis B if: ? You were born in a country where hepatitis B is common. Ask your health care provider which countries are considered high risk. ? Your parents were born in a high-risk country, and you have not been immunized against hepatitis B (hepatitis B vaccine). ? You have HIV or AIDS. ? You use needles to inject street drugs. ? You live with someone who has hepatitis B. ? You have had sex with someone who has hepatitis B. ? You get hemodialysis treatment. ? You take certain medicines for conditions, including cancer, organ transplantation, and autoimmune conditions. Hepatitis C  Blood testing is recommended for: ? Everyone born from [birth year] through  1965. ? Anyone with known risk factors for hepatitis C. Sexually transmitted infections (STIs)  You should be screened for sexually transmitted infections (STIs) including gonorrhea and chlamydia if: ? You are sexually active and are younger than 78 years of age. ? You are older than 78 years of age and your health care provider tells you that you are at risk for this type of infection. ? Your sexual activity has changed since you were  last screened and you are at an increased risk for chlamydia or gonorrhea. Ask your health care provider if you are at risk.  If you do not have HIV, but are at risk, it may be recommended that you take a prescription medicine daily to prevent HIV infection. This is called pre-exposure prophylaxis (PrEP). You are considered at risk if: ? You are sexually active and do not regularly use condoms or know the HIV status of your partner(s). ? You take drugs by injection. ? You are sexually active with a partner who has HIV. Talk with your health care provider about whether you are at high risk of being infected with HIV. If you choose to begin PrEP, you should first be tested for HIV. You should then be tested every 3 months for as long as you are taking PrEP. Pregnancy  If you are premenopausal and you may become pregnant, ask your health care provider about preconception counseling.  If you may become pregnant, take 400 to 800 micrograms (mcg) of folic acid every day.  If you want to prevent pregnancy, talk to your health care provider about birth control (contraception). Osteoporosis and menopause  Osteoporosis is a disease in which the bones lose minerals and strength with aging. This can result in serious bone fractures. Your risk for osteoporosis can be identified using a bone density scan.  If you are 80 years of age or older, or if you are at risk for osteoporosis and fractures, ask your health care provider if you should be screened.  Ask your health  care provider whether you should take a calcium or vitamin D supplement to lower your risk for osteoporosis.  Menopause may have certain physical symptoms and risks.  Hormone replacement therapy may reduce some of these symptoms and risks. Talk to your health care provider about whether hormone replacement therapy is right for you. Follow these instructions at home:  Schedule regular health, dental, and eye exams.  Stay current with your immunizations.  Do not use any tobacco products including cigarettes, chewing tobacco, or electronic cigarettes.  If you are pregnant, do not drink alcohol.  If you are breastfeeding, limit how much and how often you drink alcohol.  Limit alcohol intake to no more than 1 drink per day for nonpregnant women. One drink equals 12 ounces of beer, 5 ounces of wine, or 1 ounces of hard liquor.  Do not use street drugs.  Do not share needles.  Ask your health care provider for help if you need support or information about quitting drugs.  Tell your health care provider if you often feel depressed.  Tell your health care provider if you have ever been abused or do not feel safe at home. This information is not intended to replace advice given to you by your health care provider. Make sure you discuss any questions you have with your health care provider. Document Released: 08/04/2010 Document Revised: 06/27/2015 Document Reviewed: 10/23/2014 Elsevier Interactive Patient Education  2019 [employer].

## 2018-06-13 ENCOUNTER — Other Ambulatory Visit: Payer: Self-pay | Admitting: *Deleted

## 2018-06-13 MED ORDER — MELOXICAM 15 MG PO TABS: ORAL_TABLET | ORAL | 0 refills | Status: DC

## 2018-06-13 MED ORDER — OMEPRAZOLE 20 MG PO CPDR: DELAYED_RELEASE_CAPSULE | ORAL | 0 refills | Status: DC

## 2018-06-21 ENCOUNTER — Encounter: Payer: Self-pay | Admitting: Internal Medicine

## 2018-06-21 ENCOUNTER — Other Ambulatory Visit: Payer: Self-pay | Admitting: Internal Medicine

## 2018-06-21 ENCOUNTER — Ambulatory Visit (INDEPENDENT_AMBULATORY_CARE_PROVIDER_SITE_OTHER): Payer: Medicare Other | Admitting: Internal Medicine

## 2018-06-21 DIAGNOSIS — R739 Hyperglycemia, unspecified: Secondary | ICD-10-CM | POA: Insufficient documentation

## 2018-06-21 DIAGNOSIS — E611 Iron deficiency: Secondary | ICD-10-CM

## 2018-06-21 DIAGNOSIS — K219 Gastro-esophageal reflux disease without esophagitis: Secondary | ICD-10-CM

## 2018-06-21 DIAGNOSIS — I6522 Occlusion and stenosis of left carotid artery: Secondary | ICD-10-CM

## 2018-06-21 DIAGNOSIS — D630 Anemia in neoplastic disease: Secondary | ICD-10-CM

## 2018-06-21 DIAGNOSIS — E538 Deficiency of other specified B group vitamins: Secondary | ICD-10-CM

## 2018-06-21 DIAGNOSIS — E559 Vitamin D deficiency, unspecified: Secondary | ICD-10-CM | POA: Diagnosis not present

## 2018-06-21 DIAGNOSIS — J449 Chronic obstructive pulmonary disease, unspecified: Secondary | ICD-10-CM | POA: Diagnosis not present

## 2018-06-21 DIAGNOSIS — E785 Hyperlipidemia, unspecified: Secondary | ICD-10-CM | POA: Diagnosis not present

## 2018-06-21 MED ORDER — ZOLPIDEM TARTRATE 10 MG PO TABS: 10.0000 mg | ORAL_TABLET | Freq: Every evening | ORAL | 1 refills | Status: DC | PRN

## 2018-06-21 NOTE — Telephone Encounter (Signed)
Very sorry, unable to rx further [place] as pt was last seen mar 2019, which is longer than allowed per office refill policy  ? Can pt do a Doxy this evening?  thanks

## 2018-06-21 NOTE — Assessment & Plan Note (Signed)
For fu lab,  to f/u any worsening symptoms or concerns  

## 2018-06-21 NOTE — Assessment & Plan Note (Signed)
With mild persistent symptoms,, declines med change for now, f/u pulm as planned

## 2018-06-21 NOTE — Telephone Encounter (Signed)
Appointment has been made for a virtual visit for this eveing at 720pm.

## 2018-06-21 NOTE — Assessment & Plan Note (Signed)
stable overall by history and exam, recent data reviewed with pt, and pt to continue medical treatment as before,  to f/u any worsening symptoms or concerns  

## 2018-06-21 NOTE — Assessment & Plan Note (Signed)
stable overall by history and exam, recent data reviewed with pt, and pt to continue medical treatment as before,  to f/u any worsening symptoms or concerns, for a1c with labs 

## 2018-06-21 NOTE — Progress Notes (Signed)
Patient ID: Stacey [name], female   DOB: 02-May-1940, 78 y.o.   MRN: [id number]  Virtual Visit via Video Note  I connected with Stacey [name] on 06/21/18 at  7:20 PM EDT by a video enabled telemedicine application and verified that I am speaking with the correct person using two identifiers.  Location: Patient: at home Provider: at home   I discussed the limitations of evaluation and management by telemedicine and the availability of in person appointments. The patient expressed understanding and agreed to proceed.  History of Present Illness: Here for yearly f/u;  Overall doing ok;  Pt denies Chest pain, worsening SOB, DOE, wheezing, orthopnea, PND, worsening LE edema, palpitations, dizziness or syncope.  Pt denies neurological change such as new headache, facial or extremity weakness.  Pt denies polydipsia, polyuria, or low sugar symptoms. Pt states overall good compliance with treatment and medications, good tolerability, and has been trying to follow appropriate diet.  Pt denies worsening depressive symptoms, suicidal ideation or panic. No fever, night sweats, wt loss, loss of appetite, or other constitutional symptoms.  Pt states good ability with ADL's, has low fall risk, home safety reviewed and adequate, no other significant changes in hearing or vision, and only occasionally active with exercise. Walks well about 2-3 miles several times per wk.  No new complaints Past Medical History:  Diagnosis Date  . ANEMIA-NOS   . ANXIETY   . Breast cancer ([place]) 1997 L, 2012 R   s/p chemo/xrt  . COPD    resolved  . DIVERTICULOSIS, COLON 2008  . Dizziness   . GERD   . Hx of radiation therapy 10/19/11 -12/03/11   right breast  . HYPERLIPIDEMIA   . IRRITABLE BOWEL SYNDROME, HX OF   . Left-sided carotid artery disease ([facility])    moderate left ICA stenosis  . OSTEOARTHRITIS, HAND   . PSVT (paroxysmal supraventricular tachycardia) ([facility])    symptomatic on event monitor   Past Surgical History:   Procedure Laterality Date  . ABDOMINAL HYSTERECTOMY    . APPENDECTOMY    . BREAST BIOPSY  11/14/10    r breast: inv, insitu mammary carcinoma w/calcif, er/pr +, her2 -  . BREAST SURGERY     lumpectomy  . CATARACT EXTRACTION     both eyes  . ELECTROPHYSIOLOGIC STUDY N/A 05/10/2015   Procedure: SVT Ablation;  Surgeon: Will Meredith Leeds, MD;  Location: [facility];  Service: Cardiovascular;  Laterality: N/A;  . HERNIA REPAIR    . inguinal herniorrhapy  1984   left  . rectal fissure repair    . s/p benign breast biopsy  2003   right  . s/p left foot surgury  2009  . 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 spiral fx left foot  2008   no surgury  . TMJ ARTHROPLASTY  1989  . [place]- to remove scar tissue growth   . TONSILLECTOMY      reports that she quit smoking about 2 years ago. Her smoking use included cigarettes. She has a 54.00 pack-year smoking history. She has never used smokeless tobacco. She reports current alcohol use. She reports that she does not use drugs. family history includes Alcohol abuse in her mother; Brain cancer in her cousin; Breast cancer in her cousin, cousin, and cousin; Colon polyps in her mother; Diabetes in her mother; Hypertension in her mother; Lung cancer in her maternal grandfather and paternal uncle; Lung cancer (  age of onset: 77) in her maternal grandmother; Lymphoma in her brother; Pancreatic cancer (age of onset: 64) in her mother; Stroke in her mother; Testicular cancer in her cousin; Uterine cancer (age of onset: 38) in her mother. Allergies  Allergen Reactions  . Bee Venom Swelling  . Clindamycin/Lincomycin Diarrhea and Nausea And Vomiting  . Codeine Nausea And Vomiting   Current Outpatient Medications on File Prior to Visit  Medication Sig Dispense Refill  . aspirin 81 MG EC tablet Take 81 mg by mouth daily.      . calcium-vitamin D (OSCAL WITH D) 500-200 MG-UNIT per tablet Take 1 tablet by mouth daily.    .  cholecalciferol (VITAMIN D) 1000 UNITS tablet Take 1,000 Units by mouth daily.      . clotrimazole-betamethasone (LOTRISONE) cream Apply 1 application topically 2 (two) times daily as needed. 30 g 1  . diclofenac sodium (VOLTAREN) 1 % GEL Apply 4 g topically 4 (four) times daily as needed. 400 g 11  . diphenhydrAMINE (BENADRYL) 25 MG tablet Take 25 mg by mouth every 6 (six) hours as needed (for bee stings).     . fluticasone (FLONASE) 50 MCG/ACT nasal spray Place 2 sprays into both nostrils daily.    Marland Kitchen loratadine (CLARITIN) 10 MG tablet Take 10 mg by mouth daily.    . meloxicam (MOBIC) 15 MG tablet TAKE 1 TABLET(15 MG) BY MOUTH DAILY. NEEDS APPOINTMENT FOR FUTURE REFILLS. 90 tablet 0  . Multiple Vitamin (MULTIVITAMIN) capsule Take 1 capsule by mouth daily.      Marland Kitchen omeprazole (PRILOSEC) 20 MG capsule TAKE 1 CAPSULE(20 MG) BY MOUTH DAILY. NEEDS APPOINTMENT FOR FUTURE REFILLS. 90 capsule 0  . polyethylene glycol (MIRALAX / GLYCOLAX) packet take 1 packet once daily if needed for constipation 30 packet 11  . pseudoephedrine (SUDAFED) 30 MG tablet Take 30 mg by mouth every 4 (four) hours as needed for congestion.     . Tiotropium Bromide Monohydrate (SPIRIVA RESPIMAT) 2.5 MCG/ACT AERS Inhale 2 puffs into the lungs daily. 4 g 11   No current facility-administered medications on file prior to visit.     Observations/Objective: Alert, NAD, appropriate mood and affect, resps normal, cn 2-12 intact, moves all 4s, no visible rash or swelling Lab Results  Component Value Date   WBC 8.8 04/09/2017   HGB 12.3 04/09/2017   HCT 36.0 04/09/2017   PLT 295.0 04/09/2017   GLUCOSE 104 (H) 04/09/2017   CHOL 190 04/09/2017   TRIG 344.0 (H) 04/09/2017   HDL 50.50 04/09/2017   LDLDIRECT 99.0 04/09/2017   LDLCALC 84 07/04/2009   ALT 15 04/09/2017   AST 19 04/09/2017   NA 142 04/09/2017   K 4.0 04/09/2017   CL 107 04/09/2017   CREATININE 0.87 04/09/2017   BUN 19 04/09/2017   CO2 27 04/09/2017   TSH 2.62  04/09/2017   Assessment and Plan: See notes  Follow Up Instructions: Seen notes   I discussed the assessment and treatment plan with the patient. The patient was provided an opportunity to ask questions and all were answered. The patient agreed with the plan and demonstrated an understanding of the instructions.   The patient was advised to call back or seek an in-person evaluation if the symptoms worsen or if the condition fails to improve as anticipated.   Cathlean Cower, MD

## 2018-06-21 NOTE — Patient Instructions (Signed)

## 2018-06-21 NOTE — Telephone Encounter (Signed)
Copied from [place] [id number]. Topic: Quick Communication - Rx Refill/Question >> Jun 21, 2018 11:21 AM Robina Ade, Helene Kelp D wrote: Medication: zolpidem (AMBIEN) 10 MG tablet  Has the patient contacted their pharmacy? yes  (Agent: If no, request that the patient contact the pharmacy for the refill.) (Agent: If yes, when and what did the pharmacy advise?)  Preferred Pharmacy (with phone number or street name): [facility] [place], [address] AT [place]: Please be advised that RX refills may take up to 3 business days. We ask that you follow-up with your pharmacy.

## 2018-07-12 NOTE — Progress Notes (Addendum)
[name] [address] [place], [place] [address] Phone: [phone] Subjective:   [name], am serving as a scribe for Dr. Hulan Saas.  CC: Left shoulder pain  LDJ:[id number]   02/22/2018: Patient given injection.  Tolerated the procedure well.  Discussed icing regimen.  Bracing at night.  Home exercises given.  Worsening symptoms may need EMG to further evaluate.  Differential includes cervical radiculopathy but patient did not seem to be as much painful with the neck.  Follow-up again in 4 to 6 weeks  Update 07/13/2018: Stacey [name] is a 78 y.o. female coming in with complaint of left shoulder pain. Patient states that her left wrist has gotten worse since last visit. Unsure if injection did help her pain. Is having swelling of her entire arm.   Also here for left cervical spine pain that radiates down into her left shoulder. Is having numbness in thumb, 2nd and 3rd finger. Has been dropping items at home and is unable to type on computer.      Past Medical History:  Diagnosis Date  . ANEMIA-NOS   . ANXIETY   . Breast cancer ([facility]) 1997 L, 2012 R   s/p chemo/xrt  . COPD    resolved  . DIVERTICULOSIS, COLON 2008  . Dizziness   . GERD   . Hx of radiation therapy 10/19/11 -12/03/11   right breast  . HYPERLIPIDEMIA   . IRRITABLE BOWEL SYNDROME, HX OF   . Left-sided carotid artery disease (HCC)    moderate left ICA stenosis  . OSTEOARTHRITIS, HAND   . PSVT (paroxysmal supraventricular tachycardia) (HCC)    symptomatic on event monitor   Past Surgical History:  Procedure Laterality Date  . ABDOMINAL HYSTERECTOMY    . APPENDECTOMY    . BREAST BIOPSY  11/14/10    r breast: inv, insitu mammary carcinoma w/calcif, er/pr +, her2 -  . BREAST SURGERY     lumpectomy  . CATARACT EXTRACTION     both eyes  . ELECTROPHYSIOLOGIC STUDY N/A 05/10/2015   Procedure: SVT Ablation;  Surgeon: Will Meredith Leeds, MD;  Location: [facility];   Service: Cardiovascular;  Laterality: N/A;  . HERNIA REPAIR    . inguinal herniorrhapy  1984   left  . rectal fissure repair    . s/p benign breast biopsy  2003   right  . s/p left foot surgury  2009  . 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 spiral fx left foot  2008   no surgury  . TMJ ARTHROPLASTY  1989  . [place]- to remove scar tissue growth   . TONSILLECTOMY     Social History   Socioeconomic History  . Marital status: Divorced    Spouse name: 2 Step-children  . Number of children: 2  . Years of education: Not on file  . Highest education level: Not on file  Occupational History  . Occupation: retired Administrator, sports  . Financial resource strain: Not hard at all  . Food insecurity:    Worry: Never true    Inability: Never true  . Transportation needs:    Medical: No    Non-medical: No  Tobacco Use  . Smoking status: Former Smoker    Packs/day: 1.00    Years: 54.00    Pack years: 54.00    Types: Cigarettes    Last attempt to quit: 01/03/2016  Years since quitting: 2.5  . Smokeless tobacco: Never Used  Substance and Sexual Activity  . Alcohol use: Yes    Comment: rare/ drinks socially  . Drug use: No  . Sexual activity: Never  Lifestyle  . Physical activity:    Days per week: 6 days    Minutes per session: 40 min  . Stress: Not at all  Relationships  . Social connections:    Talks on phone: More than three times a week    Gets together: More than three times a week    Attends religious service: More than 4 times per year    Active member of club or organization: Yes    Attends meetings of clubs or organizations: More than 4 times per year    Relationship status: Divorced  Other Topics Concern  . Not on file  Social History Narrative   Patient gets no regular exercise   No biological children   2 step children   Allergies  Allergen Reactions  . Bee Venom Swelling  . Clindamycin/Lincomycin Diarrhea and Nausea And  Vomiting  . Codeine Nausea And Vomiting   Family History  Problem Relation Age of Onset  . Alcohol abuse Mother        ETOH dependence  . Hypertension Mother   . Stroke Mother   . Colon polyps Mother   . Diabetes Mother   . Pancreatic cancer Mother 70  . Uterine cancer Mother 25  . Lymphoma Brother        burkitts  . Lung cancer Paternal Uncle   . Lung cancer Maternal Grandmother 17       non-smoker  . Lung cancer Maternal Grandfather   . Breast cancer Cousin        maternal cousin, dx in her mid 32s  . Brain cancer Cousin        maternal cousin's son; dx in his 3s  . Testicular cancer Cousin        maternal cousin's son;   . Breast cancer Cousin        paternal cousin; dx in her 45s  . Breast cancer Cousin        1 maternal, 2 paternal  . Colon cancer Neg Hx   . Esophageal cancer Neg Hx   . Stomach cancer Neg Hx   . Rectal cancer Neg Hx       Current Outpatient Medications (Respiratory):  .  diphenhydrAMINE (BENADRYL) 25 MG tablet, Take 25 mg by mouth every 6 (six) hours as needed (for bee stings).  .  fluticasone (FLONASE) 50 MCG/ACT nasal spray, Place 2 sprays into both nostrils daily. Marland Kitchen  loratadine (CLARITIN) 10 MG tablet, Take 10 mg by mouth daily. .  pseudoephedrine (SUDAFED) 30 MG tablet, Take 30 mg by mouth every 4 (four) hours as needed for congestion.  .  Tiotropium Bromide Monohydrate (SPIRIVA RESPIMAT) 2.5 MCG/ACT AERS, Inhale 2 puffs into the lungs daily.  Current Outpatient Medications (Analgesics):  .  aspirin 81 MG EC tablet, Take 81 mg by mouth daily.   .  meloxicam (MOBIC) 15 MG tablet, TAKE 1 TABLET(15 MG) BY MOUTH DAILY. NEEDS APPOINTMENT FOR FUTURE REFILLS.   Current Outpatient Medications (Other):  .  calcium-vitamin D (OSCAL WITH D) 500-200 MG-UNIT per tablet, Take 1 tablet by mouth daily. .  cholecalciferol (VITAMIN D) 1000 UNITS tablet, Take 1,000 Units by mouth daily.   .  clotrimazole-betamethasone (LOTRISONE) cream, Apply 1 application  topically 2 (two) times daily as needed. Marland Kitchen  diclofenac sodium (VOLTAREN) 1 % GEL, Apply 4 g topically 4 (four) times daily as needed. .  Multiple Vitamin (MULTIVITAMIN) capsule, Take 1 capsule by mouth daily.   Marland Kitchen  omeprazole (PRILOSEC) 20 MG capsule, TAKE 1 CAPSULE(20 MG) BY MOUTH DAILY. NEEDS APPOINTMENT FOR FUTURE REFILLS. Marland Kitchen  polyethylene glycol (MIRALAX / GLYCOLAX) packet, take 1 packet once daily if needed for constipation .  zolpidem (AMBIEN) 10 MG tablet, Take 1 tablet (10 mg total) by mouth at bedtime as needed.    Past medical history, social, surgical and family history all reviewed in electronic medical record.  No pertanent information unless stated regarding to the chief complaint.   Review of Systems:  No headache, visual changes, nausea, vomiting, diarrhea, constipation, dizziness, abdominal pain, skin rash, fevers, chills, night sweats, weight loss, swollen lymph nodes, body aches, joint swelling, muscle aches, chest pain, shortness of breath, mood changes.   Objective  Blood pressure 124/72, pulse 99, height _0  (1.575 m), weight 166 lb (75.3 kg), SpO2 97 %.    General: No apparent distress alert and oriented x3 mood and affect normal, dressed appropriately.  Patient does appear pale HEENT: Pupils equal, extraocular movements intact  Respiratory: Patient's speak in full sentences and does not appear short of breath  Cardiovascular: No lower extremity edema, non tender, no erythema  Skin: Warm dry intact with no signs of infection or rash on extremities or on axial skeleton.  Abdomen: Soft nontender  Neuro: Cranial nerves II through XII are intact, neurovascularly intact in all extremities with 2+ DTRs and 2+ pulses.  Lymph: No lymphadenopathy of posterior or anterior cervical chain or axillae bilaterally.  Gait normal with good balance and coordination.  MSK:  Non tender with full range of motion and good stability and symmetric strength and tone of  elbows, wrist, hip,  knee and ankles bilaterally.  Shoulder:left  Inspection reveals patient's does have some mild atrophy.  Mild crepitus noted.  Patient does have swelling of the left upper extremity all the way to the wrist.  No true pitting though noted. 3+ out of 5 strength compared to the contralateral side. Left wrist exam shows the patient does have positive Tinel's.  Severely tender to palpation even light palpation around the wrist.  Neurovascular intact distally.  Arthritic changes of the [facility] joint also noted.  Near full range of motion of the wrist but patient is voluntarily guarding  Procedure: Real-time Ultrasound Guided Injection of left carpal tunnel Device: GE Logiq Q7 Ultrasound guided injection is preferred based studies that show increased duration, increased effect, greater accuracy, decreased procedural pain, increased response rate with ultrasound guided versus blind injection.  Verbal informed consent obtained.  Time-out conducted.  Noted no overlying erythema, induration, or other signs of local infection.  Skin prepped in a sterile fashion.  Local anesthesia: Topical Ethyl chloride.  With sterile technique and under real time ultrasound guidance:  median nerve visualized.  23g 5/8 inch needle inserted distal to proximal approach into nerve sheath. Pictures taken nfor needle placement. Patient did have injection of 2 cc of 0.5% Marcaine, and 0.5cc of Kenalog 40 mg/dL. Completed without difficulty  Pain immediately resolved suggesting accurate placement of the medication.  Advised to call if fevers/chills, erythema, induration, drainage, or persistent bleeding.  Images permanently stored and available for review in the ultrasound unit.  Impression: Technically successful ultrasound guided injection.  Procedure: Real-time Ultrasound Guided Injection of left glenohumeral joint Device: GE Logiq E  Ultrasound guided injection is preferred  based studies that show increased duration, increased  effect, greater accuracy, decreased procedural pain, increased response rate with ultrasound guided versus blind injection.  Verbal informed consent obtained.  Time-out conducted.  Noted no overlying erythema, induration, or other signs of local infection.  Skin prepped in a sterile fashion.  Local anesthesia: Topical Ethyl chloride.  With sterile technique and under real time ultrasound guidance:  Joint visualized.  21g 2 inch needle inserted posterior approach. Pictures taken for needle placement. Patient did have injection of 2 cc of 0.5% Marcaine, and 1cc of Kenalog 40 mg/dL. Completed without difficulty  Pain immediately resolved suggesting accurate placement of the medication.  Advised to call if fevers/chills, erythema, induration, drainage, or persistent bleeding.  Images permanently stored and available for review in the ultrasound unit.  Impression: Technically successful ultrasound guided injection.    Impression and Recommendations:     This case required medical decision making of moderate complexity. The above documentation has been reviewed and is accurate and complete Lyndal Pulley, DO       Note: This dictation was prepared with Dragon dictation along with smaller phrase technology. Any transcriptional errors that result from this process are unintentional.

## 2018-07-13 ENCOUNTER — Other Ambulatory Visit (INDEPENDENT_AMBULATORY_CARE_PROVIDER_SITE_OTHER): Payer: Medicare Other

## 2018-07-13 ENCOUNTER — Encounter: Payer: Self-pay | Admitting: Family Medicine

## 2018-07-13 ENCOUNTER — Other Ambulatory Visit: Payer: Self-pay

## 2018-07-13 ENCOUNTER — Ambulatory Visit (INDEPENDENT_AMBULATORY_CARE_PROVIDER_SITE_OTHER): Payer: Medicare Other | Admitting: Family Medicine

## 2018-07-13 ENCOUNTER — Ambulatory Visit: Payer: Self-pay

## 2018-07-13 VITALS — BP 124/72 | HR 99 | Ht 62.0 in | Wt 166.0 lb

## 2018-07-13 DIAGNOSIS — E611 Iron deficiency: Secondary | ICD-10-CM

## 2018-07-13 DIAGNOSIS — M25532 Pain in left wrist: Secondary | ICD-10-CM | POA: Diagnosis not present

## 2018-07-13 DIAGNOSIS — M2550 Pain in unspecified joint: Secondary | ICD-10-CM

## 2018-07-13 DIAGNOSIS — G5602 Carpal tunnel syndrome, left upper limb: Secondary | ICD-10-CM | POA: Diagnosis not present

## 2018-07-13 DIAGNOSIS — M5412 Radiculopathy, cervical region: Secondary | ICD-10-CM

## 2018-07-13 DIAGNOSIS — E559 Vitamin D deficiency, unspecified: Secondary | ICD-10-CM

## 2018-07-13 DIAGNOSIS — E538 Deficiency of other specified B group vitamins: Secondary | ICD-10-CM

## 2018-07-13 DIAGNOSIS — R739 Hyperglycemia, unspecified: Secondary | ICD-10-CM

## 2018-07-13 DIAGNOSIS — M12812 Other specific arthropathies, not elsewhere classified, left shoulder: Secondary | ICD-10-CM

## 2018-07-13 DIAGNOSIS — E785 Hyperlipidemia, unspecified: Secondary | ICD-10-CM

## 2018-07-13 DIAGNOSIS — D630 Anemia in neoplastic disease: Secondary | ICD-10-CM

## 2018-07-13 DIAGNOSIS — I6522 Occlusion and stenosis of left carotid artery: Secondary | ICD-10-CM

## 2018-07-13 LAB — URINALYSIS, ROUTINE W REFLEX MICROSCOPIC
Bilirubin Urine: NEGATIVE
Hgb urine dipstick: NEGATIVE
Ketones, ur: NEGATIVE
Nitrite: NEGATIVE
Specific Gravity, Urine: 1.02 (ref 1.000–1.030)
Urine Glucose: NEGATIVE
Urobilinogen, UA: 0.2 (ref 0.0–1.0)
pH: 6 (ref 5.0–8.0)

## 2018-07-13 LAB — CBC WITH DIFFERENTIAL/PLATELET
Basophils Absolute: 0 10*3/uL (ref 0.0–0.1)
Basophils Relative: 0.5 % (ref 0.0–3.0)
Eosinophils Absolute: 0.4 10*3/uL (ref 0.0–0.7)
Eosinophils Relative: 4.8 % (ref 0.0–5.0)
HCT: 35.1 % — ABNORMAL LOW (ref 36.0–46.0)
Hemoglobin: 11.9 g/dL — ABNORMAL LOW (ref 12.0–15.0)
Lymphocytes Relative: 18.6 % (ref 12.0–46.0)
Lymphs Abs: 1.5 10*3/uL (ref 0.7–4.0)
MCHC: 33.9 g/dL (ref 30.0–36.0)
MCV: 85.6 fl (ref 78.0–100.0)
Monocytes Absolute: 0.7 10*3/uL (ref 0.1–1.0)
Monocytes Relative: 9.3 % (ref 3.0–12.0)
Neutro Abs: 5.2 10*3/uL (ref 1.4–7.7)
Neutrophils Relative %: 66.8 % (ref 43.0–77.0)
Platelets: 283 10*3/uL (ref 150.0–400.0)
RBC: 4.1 Mil/uL (ref 3.87–5.11)
RDW: 14.4 % (ref 11.5–15.5)
WBC: 7.8 10*3/uL (ref 4.0–10.5)

## 2018-07-13 LAB — FERRITIN: Ferritin: 12.2 ng/mL (ref 10.0–291.0)

## 2018-07-13 LAB — LIPID PANEL
Cholesterol: 193 mg/dL (ref 0–200)
HDL: 49.4 mg/dL (ref >39.00)
NonHDL: 143.55
Total CHOL/HDL Ratio: 4
Triglycerides: 231 mg/dL — ABNORMAL HIGH (ref 0.0–149.0)
VLDL: 46.2 mg/dL — ABNORMAL HIGH (ref 0.0–40.0)

## 2018-07-13 LAB — HEPATIC FUNCTION PANEL
ALT: 16 U/L (ref 0–35)
AST: 22 U/L (ref 0–37)
Albumin: 4 g/dL (ref 3.5–5.2)
Alkaline Phosphatase: 61 U/L (ref 39–117)
Bilirubin, Direct: 0.1 mg/dL (ref 0.0–0.3)
Total Bilirubin: 0.4 mg/dL (ref 0.2–1.2)
Total Protein: 7 g/dL (ref 6.0–8.3)

## 2018-07-13 LAB — BASIC METABOLIC PANEL
BUN: 16 mg/dL (ref 6–23)
CO2: 24 mEq/L (ref 19–32)
Calcium: 8.5 mg/dL (ref 8.4–10.5)
Chloride: 108 mEq/L (ref 96–112)
Creatinine, Ser: 0.95 mg/dL (ref 0.40–1.20)
GFR: 56.85 mL/min — ABNORMAL LOW (ref >60.00)
Glucose, Bld: 110 mg/dL — ABNORMAL HIGH (ref 70–99)
Potassium: 3.6 mEq/L (ref 3.5–5.1)
Sodium: 143 mEq/L (ref 135–145)

## 2018-07-13 LAB — HEMOGLOBIN A1C: Hgb A1c MFr Bld: 6.6 % — ABNORMAL HIGH (ref 4.6–6.5)

## 2018-07-13 LAB — VITAMIN B12: Vitamin B-12: 293 pg/mL (ref 211–911)

## 2018-07-13 LAB — IBC PANEL
Iron: 91 ug/dL (ref 42–145)
Saturation Ratios: 28.9 % (ref 20.0–50.0)
Transferrin: 225 mg/dL (ref 212.0–360.0)

## 2018-07-13 LAB — TSH: TSH: 3.41 u[IU]/mL (ref 0.35–4.50)

## 2018-07-13 LAB — VITAMIN D 25 HYDROXY (VIT D DEFICIENCY, FRACTURES): VITD: 60.09 ng/mL (ref 30.00–100.00)

## 2018-07-13 LAB — LDL CHOLESTEROL, DIRECT: Direct LDL: 119 mg/dL

## 2018-07-13 NOTE — Assessment & Plan Note (Signed)
Repeat injection given today.  Tolerated procedure well.  We discussed icing regimen and home exercises again.  Do believe that cervical radiculopathy is contributing.  Follow-up again for repeat injections every 10 weeks if needed

## 2018-07-13 NOTE — Patient Instructions (Addendum)
Good to see you  I hope this makes things feel better Stay safe  If not better in 2 weeks then lets consider an epidural in the neck  See me again in 10 weeks

## 2018-07-13 NOTE — Assessment & Plan Note (Signed)
Patient has had a history of anemia previously.  Does appear to be pale.  Recent diarrhea that patient did state black stool.  Possible side effect to a antibiotic.  We discussed with patient and would like her to get labs today.  If significantly low and patient may need to be sent to the emergency room.  Encouraged iron supplementation potentially.  Patient is in agreement with the plan.

## 2018-07-13 NOTE — Assessment & Plan Note (Signed)
Repeat injection given again today.  Has had a history of left arm swelling.  Patient did have breast cancer in the area but her previous imaging did not show any type of recurrence.  Patient is still having lymphedema and is not wearing the compression on a regular basis.  think this is contributing.  Patient encouraged to wear the compression starting immediately in the morning.

## 2018-07-13 NOTE — Assessment & Plan Note (Signed)
Do believe a significant portion of patient's left arm pain is secondary to more of a radicular symptoms from the neck.  Encouraged gabapentin but did not want to increase secondary to patient being a fall risk.  Discussed epidural would be the next step which patient has declined in the past.  Did not want it as well.  He states meloxicam intermittently.

## 2018-07-15 ENCOUNTER — Ambulatory Visit (INDEPENDENT_AMBULATORY_CARE_PROVIDER_SITE_OTHER): Payer: Medicare Other | Admitting: Cardiovascular Disease

## 2018-07-15 ENCOUNTER — Other Ambulatory Visit: Payer: Self-pay

## 2018-07-15 ENCOUNTER — Encounter: Payer: Self-pay | Admitting: Cardiovascular Disease

## 2018-07-15 VITALS — BP 142/78 | HR 77 | Temp 97.7°F | Ht 63.0 in | Wt 160.0 lb

## 2018-07-15 DIAGNOSIS — I471 Supraventricular tachycardia: Secondary | ICD-10-CM | POA: Diagnosis not present

## 2018-07-15 DIAGNOSIS — E781 Pure hyperglyceridemia: Secondary | ICD-10-CM | POA: Diagnosis not present

## 2018-07-15 DIAGNOSIS — R002 Palpitations: Secondary | ICD-10-CM | POA: Diagnosis not present

## 2018-07-15 DIAGNOSIS — I6522 Occlusion and stenosis of left carotid artery: Secondary | ICD-10-CM | POA: Diagnosis not present

## 2018-07-15 NOTE — Assessment & Plan Note (Signed)
History of mild bilateral ICA stenosis by duplex ultrasound.  This does not need to be rechecked

## 2018-07-15 NOTE — Progress Notes (Signed)
07/15/2018 [name]   09-23-1940  [id number]  Primary Physician Jenny Reichmann Hunt Oris, MD Primary Cardiologist: Lorretta Harp MD Garret Redd[facility], [place], [place]  HPI:  Stacey [name] is a 78 y.o.  mildly overweight divorced Caucasian female no children. 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 She was referred by Dr. Jenny Reichmann for evaluation of episodic dizziness which has left several years. The last saw her in the office  05/05/2017.Her cardiac risk factor profile is notable for 50-pack-years of tobacco abuse currently smoking one pack per day but otherwise is negative. She's never had a heart attack or stroke and denies chest pain or shortness of breath. 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 She's had chemotherapy and radiation therapy. The episodes occur several times a month. The last several seconds at a time. There is no associated syncope.She had an event monitor that showed PSVT and carotid Dopplers that showed bilateral ICA stenosis in the mild-to-moderate range. She had attempt at SVT ablation by Dr.Camnitz4/7/17. He was unable to induce the arrhythmia however since that time her symptoms have somewhat improved. She discontinue smoking February 2018. Her major issue however is increasing dyspnea on exertion. She did have a normal 2-D echo performed 02/08/15.   Since I saw her in the office she has had a 2D echo 05/25/2017 which was entirely normal except for grade 1 diastolic dysfunction and a Myoview stress test that was nonischemic.  She denies chest pain but does get occasional palpitations.    Current Meds  Medication Sig  . aspirin 81 MG EC tablet Take 81 mg by mouth daily.    . calcium-vitamin D (OSCAL WITH D) 500-200 MG-UNIT per tablet Take 1 tablet by mouth daily.  . cholecalciferol (VITAMIN D) 1000 UNITS tablet Take 1,000 Units by  mouth daily.    . clotrimazole-betamethasone (LOTRISONE) cream Apply 1 application topically 2 (two) times daily as needed.  . diclofenac sodium (VOLTAREN) 1 % GEL Apply 4 g topically 4 (four) times daily as needed.  . diphenhydrAMINE (BENADRYL) 25 MG tablet Take 25 mg by mouth every 6 (six) hours as needed (for bee stings).   . fluticasone (FLONASE) 50 MCG/ACT nasal spray Place 2 sprays into both nostrils daily.  Marland Kitchen loratadine (CLARITIN) 10 MG tablet Take 10 mg by mouth daily.  . meloxicam (MOBIC) 15 MG tablet TAKE 1 TABLET(15 MG) BY MOUTH DAILY. NEEDS APPOINTMENT FOR FUTURE REFILLS.  . Multiple Vitamin (MULTIVITAMIN) capsule Take 1 capsule by mouth daily.    Marland Kitchen omeprazole (PRILOSEC) 20 MG capsule TAKE 1 CAPSULE(20 MG) BY MOUTH DAILY. NEEDS APPOINTMENT FOR FUTURE REFILLS.  Marland Kitchen polyethylene glycol (MIRALAX / GLYCOLAX) packet take 1 packet once daily if needed for constipation  . pseudoephedrine (SUDAFED) 30 MG tablet Take 30 mg by mouth every 4 (four) hours as needed for congestion.   . Tiotropium Bromide Monohydrate (SPIRIVA RESPIMAT) 2.5 MCG/ACT AERS Inhale 2 puffs into the lungs daily.  Marland Kitchen zolpidem (AMBIEN) 10 MG tablet Take 1 tablet (10 mg total) by mouth at bedtime as needed.     Allergies  Allergen Reactions  . Bee Venom Swelling  . Clindamycin/Lincomycin Diarrhea and Nausea And Vomiting  . Codeine Nausea And Vomiting    Social History   Socioeconomic History  . Marital status: Divorced    Spouse name: 2 Step-children  . Number of  children: 2  . Years of education: Not on file  . Highest education level: Not on file  Occupational History  . Occupation: retired Administrator, sports  . Financial resource strain: Not hard at all  . Food insecurity    Worry: Never true    Inability: Never true  . Transportation needs    Medical: No    Non-medical: No  Tobacco Use  . Smoking status: Former Smoker    Packs/day: 1.00    Years: 54.00    Pack years: 54.00    Types: Cigarettes     Quit date: 01/03/2016    Years since quitting: 2.5  . Smokeless tobacco: Never Used  Substance and Sexual Activity  . Alcohol use: Yes    Comment: rare/ drinks socially  . Drug use: No  . Sexual activity: Never  Lifestyle  . Physical activity    Days per week: 6 days    Minutes per session: 40 min  . Stress: Not at all  Relationships  . Social connections    Talks on phone: More than three times a week    Gets together: More than three times a week    Attends religious service: More than 4 times per year    Active member of club or organization: Yes    Attends meetings of clubs or organizations: More than 4 times per year    Relationship status: Divorced  . Intimate partner violence    Fear of current or ex partner: Not on file    Emotionally abused: Not on file    Physically abused: Not on file    Forced sexual activity: Not on file  Other Topics Concern  . Not on file  Social History Narrative   Patient gets no regular exercise   No biological children   2 step children     Review of Systems: General: negative for chills, fever, night sweats or weight changes.  Cardiovascular: negative for chest pain, dyspnea on exertion, edema, orthopnea, palpitations, paroxysmal nocturnal dyspnea or shortness of breath Dermatological: negative for rash Respiratory: negative for cough or wheezing Urologic: negative for hematuria Abdominal: negative for nausea, vomiting, diarrhea, bright red blood per rectum, melena, or hematemesis Neurologic: negative for visual changes, syncope, or dizziness All other systems reviewed and are otherwise negative except as noted above.    Blood pressure (!) 142/78, pulse 77, temperature 97.7 F (36.5 C), height 5\' 3"  (1.6 m), weight 160 lb (72.6 kg).  General appearance: alert and no distress Neck: no adenopathy, no carotid bruit, no JVD, supple, symmetrical, trachea midline and thyroid not enlarged, symmetric, no tenderness/mass/nodules Lungs:  clear to auscultation bilaterally Heart: regular rate and rhythm, S1, S2 normal, no murmur, click, rub or gallop Extremities: extremities normal, atraumatic, no cyanosis or edema Pulses: 2+ and symmetric Skin: Skin color, texture, turgor normal. No rashes or lesions Neurologic: Alert and oriented X 3, normal strength and tone. Normal symmetric reflexes. Normal coordination and gait  EKG normal sinus rhythm at 77 with left anterior fascicular block and poor R wave progression.  I personally reviewed this EKG.  ASSESSMENT AND PLAN:   Hyperlipidemia History of hyperlipidemia not on statin therapy with recent lipid profile performed 07/13/2018 revealing total cholesterol 193, triglyceride level of 231 better than it was a year ago at 344 and HDL 49.  Left-sided carotid artery disease ([place]) History of mild bilateral ICA stenosis by duplex ultrasound.  This does not need to be rechecked  SVT (supraventricular tachycardia) (  [place]) History of PSVT in the past status post unsuccessful ablation by Dr. Curt Bears 4//7/17.  She does get occasional episodes of palpitations.      Lorretta Harp MD FACP,FACC,FAHA, [facility] 07/15/2018 2:05 PM

## 2018-07-15 NOTE — Assessment & Plan Note (Signed)
History of hyperlipidemia not on statin therapy with recent lipid profile performed 07/13/2018 revealing total cholesterol 193, triglyceride level of 231 better than it was a year ago at 344 and HDL 49.

## 2018-07-15 NOTE — Patient Instructions (Signed)

## 2018-07-15 NOTE — Assessment & Plan Note (Signed)
History of PSVT in the past status post unsuccessful ablation by Dr. Curt Bears 4//7/17.  She does get occasional episodes of palpitations.

## 2018-08-16 DIAGNOSIS — H04123 Dry eye syndrome of bilateral lacrimal glands: Secondary | ICD-10-CM | POA: Diagnosis not present

## 2018-08-16 DIAGNOSIS — H1045 Other chronic allergic conjunctivitis: Secondary | ICD-10-CM | POA: Diagnosis not present

## 2018-08-16 DIAGNOSIS — H40013 Open angle with borderline findings, low risk, bilateral: Secondary | ICD-10-CM | POA: Diagnosis not present

## 2018-08-16 DIAGNOSIS — H0102A Squamous blepharitis right eye, upper and lower eyelids: Secondary | ICD-10-CM | POA: Diagnosis not present

## 2018-09-03 ENCOUNTER — Other Ambulatory Visit: Payer: Self-pay | Admitting: Internal Medicine

## 2018-09-05 MED ORDER — OMEPRAZOLE 20 MG PO CPDR: DELAYED_RELEASE_CAPSULE | ORAL | 1 refills | Status: DC

## 2018-09-15 ENCOUNTER — Telehealth: Payer: Self-pay | Admitting: Internal Medicine

## 2018-09-15 NOTE — Telephone Encounter (Signed)
Medication Refill - Medication: meloxicam (MOBIC) 15 MG tablet  Pharmacy request  Preferred Pharmacy :  [facility] [place], [address] [phone] (Phone) [phone] (Fax)

## 2018-09-16 MED ORDER — MELOXICAM 15 MG PO TABS: ORAL_TABLET | ORAL | 0 refills | Status: DC

## 2018-09-19 DIAGNOSIS — L821 Other seborrheic keratosis: Secondary | ICD-10-CM | POA: Diagnosis not present

## 2018-09-19 DIAGNOSIS — D225 Melanocytic nevi of trunk: Secondary | ICD-10-CM | POA: Diagnosis not present

## 2018-09-19 DIAGNOSIS — L820 Inflamed seborrheic keratosis: Secondary | ICD-10-CM | POA: Diagnosis not present

## 2018-09-19 DIAGNOSIS — L814 Other melanin hyperpigmentation: Secondary | ICD-10-CM | POA: Diagnosis not present

## 2018-09-19 DIAGNOSIS — Z86018 Personal history of other benign neoplasm: Secondary | ICD-10-CM | POA: Diagnosis not present

## 2018-09-23 ENCOUNTER — Ambulatory Visit (INDEPENDENT_AMBULATORY_CARE_PROVIDER_SITE_OTHER): Payer: Medicare Other | Admitting: Family Medicine

## 2018-09-23 ENCOUNTER — Encounter: Payer: Self-pay | Admitting: Family Medicine

## 2018-09-23 VITALS — BP 128/78 | HR 100 | Ht 63.0 in | Wt 170.0 lb

## 2018-09-23 DIAGNOSIS — M12812 Other specific arthropathies, not elsewhere classified, left shoulder: Secondary | ICD-10-CM

## 2018-09-23 DIAGNOSIS — G5602 Carpal tunnel syndrome, left upper limb: Secondary | ICD-10-CM

## 2018-09-23 DIAGNOSIS — M5412 Radiculopathy, cervical region: Secondary | ICD-10-CM | POA: Diagnosis not present

## 2018-09-23 DIAGNOSIS — Z0289 Encounter for other administrative examinations: Secondary | ICD-10-CM

## 2018-09-23 DIAGNOSIS — I6522 Occlusion and stenosis of left carotid artery: Secondary | ICD-10-CM

## 2018-09-23 NOTE — Assessment & Plan Note (Signed)
Patient is improved significantly a lot since the injection.  Patient still does have chronic numbness still.  Does have the left arm swellings from previous surgery.  History of radiation therapy that could also be contributing.  Discussed with patient about the possibility of a nerve conduction study that I think would be beneficial to further evaluate the possibility of the carpal tunnel as well as the cervical radiculopathy.  Patient wants to hold at this time.  We can repeat injections every 10 to 12 weeks if needed.  Patient would like to continue with that at the moment will follow-up as needed.  Spent  25 minutes with patient face-to-face and had greater than 50% of counseling including as described above in assessment and plan.

## 2018-09-23 NOTE — Assessment & Plan Note (Signed)
Stable, no injection today

## 2018-09-23 NOTE — Patient Instructions (Signed)
Left keep moving  [phone] backline # See me in 5-6 weeks

## 2018-09-23 NOTE — Assessment & Plan Note (Signed)
Stable we will continue to monitor.

## 2018-09-23 NOTE — Progress Notes (Signed)
[name] [place], [place] [address] Phone: [phone] Subjective:   Stacey [name], am serving as a scribe for Dr. Hulan Saas.  I'm seeing this patient by the request  of:    CC: Shoulder and wrist pain follow-up  [id number]   07/13/2018 Repeat injection given today.  Tolerated procedure well.  We discussed icing regimen and home exercises again.  Do believe that cervical radiculopathy is contributing.  Follow-up again for repeat injections every 10 weeks if needed  Repeat injection given again today.  Has had a history of left arm swelling.  Patient did have breast cancer in the area but her previous imaging did not show any type of recurrence.  Patient is still having lymphedema and is not wearing the compression on a regular basis.  think this is contributing.  Patient encouraged to wear the compression starting immediately in the morning.  Do believe a significant portion of patient's left arm pain is secondary to more of a radicular symptoms from the neck.  Encouraged gabapentin but did not want to increase secondary to patient being a fall risk.  Discussed epidural would be the next step which patient has declined in the past.  Did not want it as well.  He states meloxicam intermittently.  Update 09/23/2018 Stacey [name] is a 78 y.o. female coming in with complaint of shoulder and wrist pain. Pain is better following the injections.  Does still have numbness in thumb, 2nd and 3rd fingers that is constant. Is unable to put in her earrings.  Patient does not know if this is coming from the wrist or in the neck itself.  Rates the severity of pain is 8 out of 10.     Past Medical History:  Diagnosis Date  . ANEMIA-NOS   . ANXIETY   . Breast cancer ([place]) 1997 L, 2012 R   s/p chemo/xrt  . COPD    resolved  . DIVERTICULOSIS, COLON 2008  . Dizziness   . GERD   . Hx of radiation therapy 10/19/11 -12/03/11   right breast  .  HYPERLIPIDEMIA   . IRRITABLE BOWEL SYNDROME, HX OF   . Left-sided carotid artery disease (HCC)    moderate left ICA stenosis  . OSTEOARTHRITIS, HAND   . PSVT (paroxysmal supraventricular tachycardia) (HCC)    symptomatic on event monitor   Past Surgical History:  Procedure Laterality Date  . ABDOMINAL HYSTERECTOMY    . APPENDECTOMY    . BREAST BIOPSY  11/14/10    r breast: inv, insitu mammary carcinoma w/calcif, er/pr +, her2 -  . BREAST SURGERY     lumpectomy  . CATARACT EXTRACTION     both eyes  . ELECTROPHYSIOLOGIC STUDY N/A 05/10/2015   Procedure: SVT Ablation;  Surgeon: Will Meredith Leeds, MD;  Location: [facility];  Service: Cardiovascular;  Laterality: N/A;  . HERNIA REPAIR    . inguinal herniorrhapy  1984   left  . rectal fissure repair    . s/p benign breast biopsy  2003   right  . s/p left foot surgury  2009  . 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 spiral fx left foot  2008   [name] surgury  . TMJ ARTHROPLASTY  1989  . [place]- to remove scar tissue growth   . TONSILLECTOMY     Social History   Socioeconomic History  . Marital status: Divorced  Spouse name: 2 Step-children  . Number of children: 2  . Years of education: Not on file  . Highest education level: Not on file  Occupational History  . Occupation: retired Administrator, sports  . Financial resource strain: Not hard at all  . Food insecurity    Worry: Never true    Inability: Never true  . Transportation needs    Medical: [name]    Non-medical: [name]  Tobacco Use  . Smoking status: Former Smoker    Packs/day: 1.00    Years: 54.00    Pack years: 54.00    Types: Cigarettes    Quit date: 01/03/2016    Years since quitting: 2.7  . Smokeless tobacco: Never Used  Substance and Sexual Activity  . Alcohol use: Yes    Comment: rare/ drinks socially  . Drug use: [name]  . Sexual activity: Never  Lifestyle  . Physical activity    Days per week: 6 days    Minutes per  session: 40 min  . Stress: Not at all  Relationships  . Social connections    Talks on phone: More than three times a week    Gets together: More than three times a week    Attends religious service: More than 4 times per year    Active member of club or organization: Yes    Attends meetings of clubs or organizations: More than 4 times per year    Relationship status: Divorced  Other Topics Concern  . Not on file  Social History Narrative   Patient gets [name] regular exercise   [name] biological children   2 step children   Allergies  Allergen Reactions  . Bee Venom Swelling  . Clindamycin/Lincomycin Diarrhea and Nausea And Vomiting  . Codeine Nausea And Vomiting   Family History  Problem Relation Age of Onset  . Alcohol abuse Mother        ETOH dependence  . Hypertension Mother   . Stroke Mother   . Colon polyps Mother   . Diabetes Mother   . Pancreatic cancer Mother 5  . Uterine cancer Mother 80  . Lymphoma Brother        burkitts  . Lung cancer Paternal Uncle   . Lung cancer Maternal Grandmother 60       non-smoker  . Lung cancer Maternal Grandfather   . Breast cancer Cousin        maternal cousin, dx in her mid 45s  . Brain cancer Cousin        maternal cousin's son; dx in his 37s  . Testicular cancer Cousin        maternal cousin's son;   . Breast cancer Cousin        paternal cousin; dx in her 36s  . Breast cancer Cousin        1 maternal, 2 paternal  . Colon cancer Neg Hx   . Esophageal cancer Neg Hx   . Stomach cancer Neg Hx   . Rectal cancer Neg Hx       Current Outpatient Medications (Respiratory):  .  diphenhydrAMINE (BENADRYL) 25 MG tablet, Take 25 mg by mouth every 6 (six) hours as needed (for bee stings).  .  fluticasone (FLONASE) 50 MCG/ACT nasal spray, Place 2 sprays into both nostrils daily. Marland Kitchen  loratadine (CLARITIN) 10 MG tablet, Take 10 mg by mouth daily. .  pseudoephedrine (SUDAFED) 30 MG tablet, Take 30 mg by mouth every 4 (four) hours as  needed  for congestion.  .  Tiotropium Bromide Monohydrate (SPIRIVA RESPIMAT) 2.5 MCG/ACT AERS, Inhale 2 puffs into the lungs daily.  Current Outpatient Medications (Analgesics):  .  aspirin 81 MG EC tablet, Take 81 mg by mouth daily.   .  meloxicam (MOBIC) 15 MG tablet, TAKE 1 TABLET(15 MG) BY MOUTH DAILY. NEEDS APPOINTMENT FOR FUTURE REFILLS.   Current Outpatient Medications (Other):  .  calcium-vitamin D (OSCAL WITH D) 500-200 MG-UNIT per tablet, Take 1 tablet by mouth daily. .  cholecalciferol (VITAMIN D) 1000 UNITS tablet, Take 1,000 Units by mouth daily.   .  clotrimazole-betamethasone (LOTRISONE) cream, Apply 1 application topically 2 (two) times daily as needed. .  diclofenac sodium (VOLTAREN) 1 % GEL, Apply 4 g topically 4 (four) times daily as needed. .  Multiple Vitamin (MULTIVITAMIN) capsule, Take 1 capsule by mouth daily.   Marland Kitchen  omeprazole (PRILOSEC) 20 MG capsule, TAKE 1 CAPSULE(20 MG) BY MOUTH DAILY. Marland Kitchen  polyethylene glycol (MIRALAX / GLYCOLAX) packet, take 1 packet once daily if needed for constipation .  zolpidem (AMBIEN) 10 MG tablet, Take 1 tablet (10 mg total) by mouth at bedtime as needed.    Past medical history, social, surgical and family history all reviewed in electronic medical record.  [name] pertanent information unless stated regarding to the chief complaint.   Review of Systems:  [name] headache, visual changes, nausea, vomiting, diarrhea, constipation, dizziness, abdominal pain, skin rash, fevers, chills, night sweats, weight loss, swollen lymph nodes, body aches, joint swelling, chest pain, shortness of breath, mood changes.  Positive muscle aches  Objective  Blood pressure 128/78, pulse 100, height _0  (1.6 m), weight 170 lb (77.1 kg), SpO2 97 %.    General: [name] apparent distress alert and oriented x3 mood and affect normal, dressed appropriately.  HEENT: Pupils equal, extraocular movements intact  Respiratory: Patient's speak in full sentences and does not  appear short of breath  Cardiovascular: [name] lower extremity edema, non tender, [name] erythema  Skin: Warm dry intact with [name] signs of infection or rash on extremities or on axial skeleton.  Abdomen: Soft nontender  Neuro: Cranial nerves II through XII are intact, neurovascularly intact in all extremities with 2+ DTRs and 2+ pulses.  Lymph: [name] lymphadenopathy of posterior or anterior cervical chain or axillae bilaterally.  Gait normal with good balance and coordination.  MSK:  tender with full range of motion and good stability and symmetric strength and tone of shoulders, elbows, wrist, hip, knee and ankles bilaterally.  Severe arthritic changes of multiple joints.  Patient does have edema of the left upper extremity. Wrist does have some arthritic changes.  Patient shoulder though is improved. Shoulder does have some arthritic changes.  Mild atrophy noted.  Patient does have positive impingement.  Rotator cuff strength 3+ out of 5.  Neck exam does have some crepitus noted.  Mild positive radicular symptoms with Spurling's on the left side in the C6 and C7 distribution.   Impression and Recommendations:     This case required medical decision making of moderate complexity. The above documentation has been reviewed and is accurate and complete Lyndal Pulley, DO       Note: This dictation was prepared with Dragon dictation along with smaller phrase technology. Any transcriptional errors that result from this process are unintentional.

## 2018-11-03 ENCOUNTER — Ambulatory Visit: Payer: Self-pay

## 2018-11-03 ENCOUNTER — Ambulatory Visit (INDEPENDENT_AMBULATORY_CARE_PROVIDER_SITE_OTHER): Payer: Medicare Other | Admitting: Family Medicine

## 2018-11-03 ENCOUNTER — Other Ambulatory Visit: Payer: Self-pay

## 2018-11-03 ENCOUNTER — Encounter: Payer: Self-pay | Admitting: Family Medicine

## 2018-11-03 ENCOUNTER — Ambulatory Visit (INDEPENDENT_AMBULATORY_CARE_PROVIDER_SITE_OTHER)
Admission: RE | Admit: 2018-11-03 | Discharge: 2018-11-03 | Disposition: A | Discharge status: Home / Self Care | Payer: Medicare Other | Source: Ambulatory Visit | Attending: Family Medicine | Admitting: Family Medicine

## 2018-11-03 VITALS — BP 108/66 | HR 96 | Ht 63.0 in | Wt 171.0 lb

## 2018-11-03 DIAGNOSIS — M25512 Pain in left shoulder: Secondary | ICD-10-CM

## 2018-11-03 DIAGNOSIS — G8929 Other chronic pain: Secondary | ICD-10-CM

## 2018-11-03 DIAGNOSIS — M12812 Other specific arthropathies, not elsewhere classified, left shoulder: Secondary | ICD-10-CM

## 2018-11-03 DIAGNOSIS — Z23 Encounter for immunization: Secondary | ICD-10-CM | POA: Diagnosis not present

## 2018-11-03 DIAGNOSIS — I6522 Occlusion and stenosis of left carotid artery: Secondary | ICD-10-CM | POA: Diagnosis not present

## 2018-11-03 NOTE — Assessment & Plan Note (Signed)
Repeat injection given.  Discussed icing regimen and home exercises.  Patient is lymphedema is somewhat worse over the past medical history significant for breast cancer who encourage patient about advanced imaging again.  MRI 1 year ago was unremarkable.  Patient does have some mild fullness of the left side of the neck that patient feels like is at her baseline.  Does appear to lobe more to me.  Patient did not want any other interventions at the moment.  Patient will be undergoing mammogram.  Follow-up again otherwise in 8 to 10 weeks

## 2018-11-03 NOTE — Patient Instructions (Signed)
See me in 6 weeks

## 2018-11-03 NOTE — Progress Notes (Signed)
[name] [place], [place] [address] Phone: [phone] Subjective:   Stacey [name], am serving as a scribe for Dr. Hulan Saas.   CC: Wrist pain, shoulder pain follow-up  [id number]   09/23/2018 Stable, [name] injection today-shoulder  Patient is improved significantly a lot since the injection.  Patient still does have chronic numbness still.  Does have the left arm swellings from previous surgery.  History of radiation therapy that could also be contributing.  Discussed with patient about the possibility of a nerve conduction study that I think would be beneficial to further evaluate the possibility of the carpal tunnel as well as the cervical radiculopathy.  Patient wants to hold at this time.  We can repeat injections every 10 to 12 weeks if needed.  Patient would like to continue with that at the moment will follow-up as needed.  Spent  25 minutes with patient face-to-face and had greater than 50% of counseling including as described above in assessment and plan.  Update 11/03/2018 Stacey [name] is a 78 y.o. female coming in with complaint of swelling of left arm. Is having pain throughout the arm. Has gone from medium sized compression sleeve to an XL.  Patient has had this difficulty for some time.  Does have a history of breast cancer.  Status post radiation therapy.  Shoulder has become painful since last visit. Dull, achy pain.  Patient states waking her up at night.  Has responded to injections in the past.    Past Medical History:  Diagnosis Date  . ANEMIA-NOS   . ANXIETY   . Breast cancer ([place]) 1997 L, 2012 R   s/p chemo/xrt  . COPD    resolved  . DIVERTICULOSIS, COLON 2008  . Dizziness   . GERD   . Hx of radiation therapy 10/19/11 -12/03/11   right breast  . HYPERLIPIDEMIA   . IRRITABLE BOWEL SYNDROME, HX OF   . Left-sided carotid artery disease (HCC)    moderate left ICA stenosis  . OSTEOARTHRITIS, HAND   . PSVT  (paroxysmal supraventricular tachycardia) (HCC)    symptomatic on event monitor   Past Surgical History:  Procedure Laterality Date  . ABDOMINAL HYSTERECTOMY    . APPENDECTOMY    . BREAST BIOPSY  11/14/10    r breast: inv, insitu mammary carcinoma w/calcif, er/pr +, her2 -  . BREAST SURGERY     lumpectomy  . CATARACT EXTRACTION     both eyes  . ELECTROPHYSIOLOGIC STUDY N/A 05/10/2015   Procedure: SVT Ablation;  Surgeon: Will Meredith Leeds, MD;  Location: [facility];  Service: Cardiovascular;  Laterality: N/A;  . HERNIA REPAIR    . inguinal herniorrhapy  1984   left  . rectal fissure repair    . s/p benign breast biopsy  2003   right  . s/p left foot surgury  2009  . 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 spiral fx left foot  2008   [name] surgury  . TMJ ARTHROPLASTY  1989  . [place]- to remove scar tissue growth   . TONSILLECTOMY     Social History   Socioeconomic History  . Marital status: Divorced    Spouse name: 2 Step-children  . Number of children: 2  . Years of education: Not on file  . Highest education level: Not on file  Occupational History  . Occupation: retired Banker  Social Needs  . Financial resource strain: Not hard at all  . Food insecurity    Worry: Never true    Inability: Never true  . Transportation needs    Medical: [name]    Non-medical: [name]  Tobacco Use  . Smoking status: Former Smoker    Packs/day: 1.00    Years: 54.00    Pack years: 54.00    Types: Cigarettes    Quit date: 01/03/2016    Years since quitting: 2.8  . Smokeless tobacco: Never Used  Substance and Sexual Activity  . Alcohol use: Yes    Comment: rare/ drinks socially  . Drug use: [name]  . Sexual activity: Never  Lifestyle  . Physical activity    Days per week: 6 days    Minutes per session: 40 min  . Stress: Not at all  Relationships  . Social connections    Talks on phone: More than three times a week    Gets together: More than three times  a week    Attends religious service: More than 4 times per year    Active member of club or organization: Yes    Attends meetings of clubs or organizations: More than 4 times per year    Relationship status: Divorced  Other Topics Concern  . Not on file  Social History Narrative   Patient gets [name] regular exercise   [name] biological children   2 step children   Allergies  Allergen Reactions  . Bee Venom Swelling  . Clindamycin/Lincomycin Diarrhea and Nausea And Vomiting  . Codeine Nausea And Vomiting   Family History  Problem Relation Age of Onset  . Alcohol abuse Mother        ETOH dependence  . Hypertension Mother   . Stroke Mother   . Colon polyps Mother   . Diabetes Mother   . Pancreatic cancer Mother 56  . Uterine cancer Mother 60  . Lymphoma Brother        burkitts  . Lung cancer Paternal Uncle   . Lung cancer Maternal Grandmother 30       non-smoker  . Lung cancer Maternal Grandfather   . Breast cancer Cousin        maternal cousin, dx in her mid 7s  . Brain cancer Cousin        maternal cousin's son; dx in his 28s  . Testicular cancer Cousin        maternal cousin's son;   . Breast cancer Cousin        paternal cousin; dx in her 5s  . Breast cancer Cousin        1 maternal, 2 paternal  . Colon cancer Neg Hx   . Esophageal cancer Neg Hx   . Stomach cancer Neg Hx   . Rectal cancer Neg Hx       Current Outpatient Medications (Respiratory):  .  diphenhydrAMINE (BENADRYL) 25 MG tablet, Take 25 mg by mouth every 6 (six) hours as needed (for bee stings).  .  fluticasone (FLONASE) 50 MCG/ACT nasal spray, Place 2 sprays into both nostrils daily. Marland Kitchen  loratadine (CLARITIN) 10 MG tablet, Take 10 mg by mouth daily. .  pseudoephedrine (SUDAFED) 30 MG tablet, Take 30 mg by mouth every 4 (four) hours as needed for congestion.  .  Tiotropium Bromide Monohydrate (SPIRIVA RESPIMAT) 2.5 MCG/ACT AERS, Inhale 2 puffs into the lungs daily.  Current Outpatient Medications  (Analgesics):  .  aspirin 81 MG EC tablet, Take 81  mg by mouth daily.   .  meloxicam (MOBIC) 15 MG tablet, TAKE 1 TABLET(15 MG) BY MOUTH DAILY. NEEDS APPOINTMENT FOR FUTURE REFILLS.   Current Outpatient Medications (Other):  .  calcium-vitamin D (OSCAL WITH D) 500-200 MG-UNIT per tablet, Take 1 tablet by mouth daily. .  cholecalciferol (VITAMIN D) 1000 UNITS tablet, Take 1,000 Units by mouth daily.   .  clotrimazole-betamethasone (LOTRISONE) cream, Apply 1 application topically 2 (two) times daily as needed. .  diclofenac sodium (VOLTAREN) 1 % GEL, Apply 4 g topically 4 (four) times daily as needed. .  Multiple Vitamin (MULTIVITAMIN) capsule, Take 1 capsule by mouth daily.   Marland Kitchen  omeprazole (PRILOSEC) 20 MG capsule, TAKE 1 CAPSULE(20 MG) BY MOUTH DAILY. Marland Kitchen  polyethylene glycol (MIRALAX / GLYCOLAX) packet, take 1 packet once daily if needed for constipation .  zolpidem (AMBIEN) 10 MG tablet, Take 1 tablet (10 mg total) by mouth at bedtime as needed.    Past medical history, social, surgical and family history all reviewed in electronic medical record.  [name] pertanent information unless stated regarding to the chief complaint.   Review of Systems:  [name] headache, visual changes, nausea, vomiting, diarrhea, constipation, dizziness, abdominal pain, skin rash, fevers, chills, night sweats, weight loss, swollen lymph nodes, body aches, joint swelling, chest pain, shortness of breath, mood changes.  Positive muscle aches  Objective  Blood pressure 108/66, pulse 96, height '5\' 3"'  (1.6 m), weight 171 lb (77.6 kg), SpO2 97 %. =   General: [name] apparent distress alert and oriented x3 mood and affect normal, dressed appropriately.  HEENT: Pupils equal, extraocular movements intact  Respiratory: Patient's speak in full sentences and does not appear short of breath  Cardiovascular: Trace lower extremity edema, tender, [name] erythema significant swelling of the left upper extremity with lymphedema at present.   Patient's baseline Skin: Warm dry intact with [name] signs of infection or rash on extremities or on axial skeleton.  Abdomen: Soft nontender  Neuro: Cranial nerves II through XII are intact, neurovascularly intact in all extremities with 2+ DTRs and 2+ pulses.  Lymph: [name] lymphadenopathy of posterior or anterior cervical chain or axillae bilaterally.  Gait antalgic MSK:  tender with limited range of motion and good stability and symmetric strength and tone of  elbows, wrist, hip, knee and ankles bilaterally.   Left shoulder exam shows the patient does have some mild atrophy of the musculature.  Does have the lymphedema on the upper extremity.  Patient still has some mild swelling in the neck.  Positive impingement.  4-5 strength of the rotator cuff.  Mild limited range of motion  Procedure: Real-time Ultrasound Guided Injection of left glenohumeral joint Device: GE Logiq E  Ultrasound guided injection is preferred based studies that show increased duration, increased effect, greater accuracy, decreased procedural pain, increased response rate with ultrasound guided versus blind injection.  Verbal informed consent obtained.  Time-out conducted.  Noted [name] overlying erythema, induration, or other signs of local infection.  Skin prepped in a sterile fashion.  Local anesthesia: Topical Ethyl chloride.  With sterile technique and under real time ultrasound guidance:  Joint visualized.  21g 2 inch needle inserted posterior approach. Pictures taken for needle placement. Patient did have injection of 2 cc of 0.5% Marcaine, and 1cc of Kenalog 40 mg/dL. Completed without difficulty  Pain immediately resolved suggesting accurate placement of the medication.  Advised to call if fevers/chills, erythema, induration, drainage, or persistent bleeding.  Images permanently stored and available for review in  the ultrasound unit.  Impression: Technically successful ultrasound guided injection.    Impression and  Recommendations:     This case required medical decision making of moderate complexity. The above documentation has been reviewed and is accurate and complete Lyndal Pulley, DO       Note: This dictation was prepared with Dragon dictation along with smaller phrase technology. Any transcriptional errors that result from this process are unintentional.

## 2018-11-15 DIAGNOSIS — H00024 Hordeolum internum left upper eyelid: Secondary | ICD-10-CM | POA: Diagnosis not present

## 2018-11-15 DIAGNOSIS — H00034 Abscess of left upper eyelid: Secondary | ICD-10-CM | POA: Diagnosis not present

## 2018-11-18 DIAGNOSIS — H00024 Hordeolum internum left upper eyelid: Secondary | ICD-10-CM | POA: Diagnosis not present

## 2018-11-18 DIAGNOSIS — H00034 Abscess of left upper eyelid: Secondary | ICD-10-CM | POA: Diagnosis not present

## 2018-12-02 ENCOUNTER — Other Ambulatory Visit: Payer: Self-pay

## 2018-12-02 ENCOUNTER — Encounter: Payer: Self-pay | Admitting: Internal Medicine

## 2018-12-02 ENCOUNTER — Ambulatory Visit (INDEPENDENT_AMBULATORY_CARE_PROVIDER_SITE_OTHER): Payer: Medicare Other | Admitting: Internal Medicine

## 2018-12-02 DIAGNOSIS — I6522 Occlusion and stenosis of left carotid artery: Secondary | ICD-10-CM | POA: Diagnosis not present

## 2018-12-02 DIAGNOSIS — J449 Chronic obstructive pulmonary disease, unspecified: Secondary | ICD-10-CM

## 2018-12-02 NOTE — Patient Instructions (Addendum)
After 2 weeks of exercise try reduce reduce your spiriva to one puff each am and after 2 more weeks stop it completely.  If breathing is worse off spiriva we could give you stiolto as a substitute.   Please schedule a follow up visit in 12  months but call sooner if needed

## 2018-12-02 NOTE — Progress Notes (Signed)
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 Pulmonary Consult    Referred by Dr. Cathlean Cower for eval of abnormal PFT. Pt c/o SOB for the past year, progressively getting worse. She states she tires very easily and her exercise tolerance has gone downhill.  She has rx for albuterol inhaler, but never had it filled.   indolent onset doe x 2012 worse since summer 2017 but since then about the same  Doe MMRC1 = can walk nl pace, flat grade, can't hurry or go uphills or steps s sob   Was walking 18 min / mile prior to 23 min mile now  Taking prilosec at least one daily right before bfast for overt HB Was prescribed inhalers but never filled rx or tried one of any kind rec Spiriva 2 puffs each am x 2 week sample and if your ex tolerance is better, stay on it indefinitely  Work on inhaler technique:   You have mild copd (GOLD I)    12/01/2017  f/u ov/Stacey [name] re:  Copd gold I  maint on spiriva only  Chief Complaint  Patient presents with  . Follow-up    c/o sob with exertion  Dyspnea:  [place]ks [place] the park and sob on slopes since "I don't remember" - total walk s stop x 76mn  Min at avg pace One mile in 26 min Cough: no Sleeping: flat  rec  No change rx     12/02/2018  f/u ov/Stacey [name] re: GOLD I/ dry mouth on spiriva smi x 2 each am  Chief Complaint  Patient presents with  . Follow-up    Breathing seems slightly worse since her last visit. She does not have a rescue inhaler.   Dyspnea:  MMRC2 =  can't walk a nl pace on a flat grade s sob but does fine slow and flat   Just started doing treadmill:  Walking 30 min x 2lpm at flat grade/ never checks sats  Cough: none Sleeping: flat / sleeps prone, one pillow  SABA use: none 02: none    No obvious day to day or daytime variability or assoc excess/ purulent sputum or mucus plugs or hemoptysis or cp or chest tightness, subjective wheeze or overt sinus or hb symptoms.   Sleeping as above  without nocturnal  or early am exacerbation  of respiratory  c/o's or need for noct saba. Also denies any obvious fluctuation of symptoms with weather or environmental changes or other aggravating or alleviating factors except as outlined above   No unusual exposure hx or h/o childhood pna/ asthma or knowledge of premature birth.  Current Allergies, Complete Past Medical History, Past Surgical History, Family History, and Social History were reviewed in [employer] record.  ROS  The following are not active complaints unless bolded Hoarseness, sore throat, dysphagia, dental problems, itching, sneezing,  nasal congestion or discharge of excess mucus  or purulent secretions, ear ache,   fever, chills, sweats, unintended wt loss or wt gain, classically pleuritic or exertional cp,  orthopnea pnd or arm/hand swelling  or leg swelling, presyncope, palpitations, abdominal pain, anorexia, nausea, vomiting, diarrhea  or change in bowel habits or change in bladder habits, change in stools or change in urine, dysuria, hematuria,  rash, arthralgias, visual complaints, headache, numbness, weakness or ataxia or problems with walking or coordination,  change in mood or  Memory. Dry mouth        Current Meds  Medication Sig  . aspirin 81 MG EC tablet Take 81 mg by mouth daily.    . calcium-vitamin D (OSCAL WITH D) 500-200 MG-UNIT per tablet Take 1 tablet by mouth daily.  . cholecalciferol (VITAMIN D) 1000 UNITS tablet Take 1,000 Units by mouth daily.     . clotrimazole-betamethasone (LOTRISONE) cream Apply 1 application topically 2 (two) times daily as needed.  . diclofenac sodium (VOLTAREN) 1 % GEL Apply 4 g topically 4 (four) times daily as needed.  . diphenhydrAMINE (BENADRYL) 25 MG tablet Take 25 mg by mouth every 6 (six) hours as needed (for bee stings).   . fluticasone (FLONASE) 50 MCG/ACT nasal spray Place 2 sprays into both nostrils daily.  Marland Kitchen loratadine (CLARITIN) 10 MG tablet Take 10 mg by mouth daily.  . meloxicam (MOBIC) 15 MG tablet TAKE 1 TABLET(15 MG) BY MOUTH DAILY. NEEDS APPOINTMENT FOR FUTURE REFILLS.  . Multiple Vitamin (MULTIVITAMIN) capsule Take 1 capsule by mouth daily.    Marland Kitchen omeprazole (PRILOSEC) 20 MG capsule TAKE 1 CAPSULE(20 MG) BY MOUTH DAILY.  Marland Kitchen polyethylene glycol (MIRALAX / GLYCOLAX) packet take 1 packet once daily if needed for constipation  . pseudoephedrine (SUDAFED) 30 MG tablet Take 30 mg by mouth every 4 (four) hours as needed for congestion.   . Tiotropium Bromide Monohydrate (SPIRIVA RESPIMAT) 2.5 MCG/ACT AERS Inhale 2 puffs into the lungs daily.  Marland Kitchen zolpidem (AMBIEN) 10 MG tablet Take 1 tablet (10 mg total) by mouth at bedtime as needed.                    Objective:   Physical Exam    amb wf nad   12/02/2018      170 12/01/2017      171   08/27/16 157 lb (71.2 kg)  05/14/16 163 lb (73.9 kg)  01/14/16 160 lb (72.6 kg)     BP 114/82 (BP Location: Left Arm, Cuff Size: Normal)   Pulse 96   Temp 97.6 F (36.4 C) (Temporal)   Ht 5' 3.75" (1.619 m)   Wt 170 lb (77.1 kg)   SpO2 96% Comment: on RA  BMI 29.41 kg/m     HEENT : pt wearing mask not removed for exam due to covid - 19 concerns.    NECK :  without JVD/Nodes/TM/ nl carotid upstrokes bilaterally   LUNGS: no acc muscle use,  Mild barrel  contour chest wall with bilateral  Distant bs s audible wheeze and  without cough on insp or exp maneuvers  and mild  Hyperresonant  to  percussion bilaterally     CV:  RRR  no s3 or murmur  or increase in P2, and no edema   ABD:  soft and nontender with pos end  insp Hoover's  in the supine position. No bruits or organomegaly appreciated, bowel sounds nl  MS:   Nl gait/  ext warm without deformities, calf tenderness, cyanosis or clubbing No obvious  joint restrictions   SKIN: warm and dry without lesions    NEURO:  alert, approp, nl sensorium with  no motor or cerebellar deficits apparent.             Assessment:

## 2018-12-03 ENCOUNTER — Encounter: Payer: Self-pay | Admitting: Internal Medicine

## 2018-12-03 NOTE — Assessment & Plan Note (Addendum)
Quit smoking 01/2016 - PFT's  08/27/2016  FEV1 1.83 (86 % ) ratio 63  p 2 % improvement from saba p nothing prior to study with DLCO  43/45c % corrects to 60  % for alv volume  - 08/27/2016   Walked RA  2 laps @ 185 ft each stopped due to legs gave out walking fast pace, no sob or desat  - 08/27/2016  After extensive coaching device  effectiveness =    75% > try spiriva respimat 2.5 mg x 2 pffs each am   12/02/2018  C/o dry mouth so rec taper off spiriva to see if affects doe or mouth and if can tolerate one puff spiriva  (but not zero due to doe)  then options are one puff spiriva or one stiolto daily   Each maintenance medication was reviewed in detail including most importantly the difference between maintenance and as needed and under what circumstances the prns are to be used.  Please see AVS for specific  Instructions which are unique to this visit and I personally typed out  which were reviewed in detail in writing with the patient and a copy provided.    F/u can be yearly - call sooner prn

## 2018-12-15 ENCOUNTER — Ambulatory Visit: Payer: Medicare Other | Admitting: Family Medicine

## 2018-12-27 DIAGNOSIS — Z853 Personal history of malignant neoplasm of breast: Secondary | ICD-10-CM | POA: Diagnosis not present

## 2018-12-27 DIAGNOSIS — Z1231 Encounter for screening mammogram for malignant neoplasm of breast: Secondary | ICD-10-CM | POA: Diagnosis not present

## 2019-01-02 ENCOUNTER — Telehealth: Payer: Self-pay | Admitting: Internal Medicine

## 2019-01-02 NOTE — Telephone Encounter (Signed)
Left message for patient to call back  

## 2019-01-03 NOTE — Telephone Encounter (Signed)
Spoke with the pt  She states that her breathing seems worse off of the spiriva and she wants to start taking this again  Per last AVS if this was the case we could try her on Stiolto  She states that the reason that MW even took her off spiriva was due to dry mouth, but she read about side effects of stiolto and it has the same side effects and the spiriva worked well for her so she is fine dealing with dry mouth  Either way she will try which ever MW wants  Also she is asking for handicap placard to be mailed to her  Please advise, thanks!

## 2019-01-03 NOTE — Telephone Encounter (Signed)
Fine with me but if she's talking about the powder form it tends to be much worse on upper airway so should try stiolto x one sample at least as it has spiriva in it in spray form at much lower dose than the powder so less side effecits

## 2019-01-03 NOTE — Telephone Encounter (Signed)
Called and spoke with pt letting her know the info stated by MW. After stating the info to her, pt said that she had read the side effects for both the stiolto and spiriva and they were pretty much the same.  Due to this, pt wants to know if she can just go back on the spiriva. Dr. Melvyn Novas, please advise if you are okay with her going back on the spiriva or if you do want her to try the stiolto instead.

## 2019-01-03 NOTE — Telephone Encounter (Signed)
12/02/2018  C/o dry mouth so rec taper off spiriva to see if affects doe or mouth and if can tolerate one puff spiriva  (but not zero due to doe)  then options are one puff spiriva or one stiolto daily   So ok to use one  Or two puffs of spiriva if work equally well but the mouth dryness will be twice as bad with 2 pffs as it is with one.  That's why I gave her the option of one puff of stiolto which causes half the dryness of what she was doing at last ov but fine with me if doesn't mind the dryness to resume prior rx (spiriva 2.5 )  Either as one or two puffs daily whatever she prefers

## 2019-01-04 MED ORDER — SPIRIVA RESPIMAT 2.5 MCG/ACT IN AERS: 2.0000 | INHALATION_SPRAY | Freq: Every day | RESPIRATORY_TRACT | 11 refills | Status: DC

## 2019-01-04 NOTE — Telephone Encounter (Signed)
Called and spoke with pt letting her know the info from [facility]. Pt did state again that she wanted to go back on the spiriva. Verified pt's preferred pharmacy and sent Rx in to pharmacy. Nothing further needed.

## 2019-01-05 ENCOUNTER — Telehealth: Payer: Self-pay | Admitting: Internal Medicine

## 2019-01-05 NOTE — Telephone Encounter (Signed)
Dr Melvyn Novas do you want to sign for handicap placard for her? Please advise thanks

## 2019-01-06 NOTE — Telephone Encounter (Signed)
Fine with me

## 2019-01-06 NOTE — Telephone Encounter (Signed)
Form placed in Dr Gustavus Bryant lookat awaiting signature

## 2019-01-06 NOTE — Telephone Encounter (Signed)
LMTCB- need to let her know that I have mailed her the signed form

## 2019-01-10 NOTE — Telephone Encounter (Addendum)
Attempted to call pt but unable to reach. Left message for pt to return call if handicap placard did not arrive at address. Nothing further needed.Marland Kitchen

## 2019-01-11 ENCOUNTER — Telehealth: Payer: Self-pay | Admitting: Internal Medicine

## 2019-01-11 NOTE — Telephone Encounter (Signed)
I called and spoke with the patient and made her aware that the mail more than likely did not go out until Monday since the 4th was a Friday. I advised her to call if she did not receive it by the end of the week to call back and let us know.

## 2019-01-19 ENCOUNTER — Other Ambulatory Visit: Payer: Self-pay

## 2019-01-19 ENCOUNTER — Ambulatory Visit (INDEPENDENT_AMBULATORY_CARE_PROVIDER_SITE_OTHER): Payer: Medicare Other | Admitting: Family Medicine

## 2019-01-19 ENCOUNTER — Encounter: Payer: Self-pay | Admitting: Family Medicine

## 2019-01-19 ENCOUNTER — Ambulatory Visit: Payer: Self-pay

## 2019-01-19 VITALS — BP 124/82 | HR 72 | Ht 63.75 in | Wt 170.0 lb

## 2019-01-19 DIAGNOSIS — I6522 Occlusion and stenosis of left carotid artery: Secondary | ICD-10-CM

## 2019-01-19 DIAGNOSIS — M12812 Other specific arthropathies, not elsewhere classified, left shoulder: Secondary | ICD-10-CM | POA: Diagnosis not present

## 2019-01-19 DIAGNOSIS — G8929 Other chronic pain: Secondary | ICD-10-CM

## 2019-01-19 DIAGNOSIS — M25512 Pain in left shoulder: Secondary | ICD-10-CM | POA: Diagnosis not present

## 2019-01-19 NOTE — Assessment & Plan Note (Signed)
Repeat injection given today.  We discussed patient's lymphedema worsening in the upper extremity and we could do further work-up including laboratory work-up and potential imaging which patient declined.  Most recent mammogram was unremarkable.  Discussed different medications that could help with pain which patient also declined.  Patient understands that if worsening pain these are other options.  Patient will follow up with me in 10 weeks for another injection.

## 2019-01-19 NOTE — Patient Instructions (Addendum)
  [address], 1st floor [place], [place] [address] Phone [phone]  Injected shoulder today Circumferential Pneumatic Arm Sleeve Pennsaid 2x daily Options: if not better in 2 week, injection in neck, injection in wrist, or lymedema clinic See me again in 10 weeks

## 2019-01-19 NOTE — Progress Notes (Signed)
[name] [place], [place] [address] Phone: [phone] Subjective:   [name], am serving as a scribe for Dr. Hulan Saas. This visit occurred during the SARS-CoV-2 public health emergency.  Safety protocols were in place, including screening questions prior to the visit, additional usage of staff PPE, and extensive cleaning of exam room while observing appropriate contact time as indicated for disinfecting solutions.    CC: Shoulder pain follow-up  [id number]   11/03/2018 Repeat injection given.  Discussed icing regimen and home exercises.  Patient is lymphedema is somewhat worse over the past medical history significant for breast cancer who encourage patient about advanced imaging again.  MRI 1 year ago was unremarkable.  Patient does have some mild fullness of the left side of the neck that patient feels like is at her baseline.  Does appear to lobe more to me.  Patient did not want any other interventions at the moment.  Patient will be undergoing mammogram.  Follow-up again otherwise in 8 to 10 weeks  Update 01/19/2019 MAKAYLEN THIEME is a 78 y.o. female coming in with complaint of left shoulder pain. States that her shoulder continues to bother her. Does get some relief from the injection. Is not able to feel with the 2nd and 3rd fingers.  Patient has noticed the swelling getting worse.  Discussed this with her cancer doctor in discussed changing the compression.  Patient recently did have a mammogram done that was independently visualized by me showing no significant recurrent difficulties.  Patient states that the shoulder seems to be worsening slowly though as well.  More pain at night    Past Medical History:  Diagnosis Date  . ANEMIA-NOS   . ANXIETY   . Breast cancer ([place]) 1997 L, 2012 R   s/p chemo/xrt  . COPD    resolved  . DIVERTICULOSIS, COLON 2008  . Dizziness   . GERD   . Hx of radiation therapy 10/19/11 -12/03/11     right breast  . HYPERLIPIDEMIA   . IRRITABLE BOWEL SYNDROME, HX OF   . Left-sided carotid artery disease (HCC)    moderate left ICA stenosis  . OSTEOARTHRITIS, HAND   . PSVT (paroxysmal supraventricular tachycardia) (HCC)    symptomatic on event monitor   Past Surgical History:  Procedure Laterality Date  . ABDOMINAL HYSTERECTOMY    . APPENDECTOMY    . BREAST BIOPSY  11/14/10    r breast: inv, insitu mammary carcinoma w/calcif, er/pr +, her2 -  . BREAST SURGERY     lumpectomy  . CATARACT EXTRACTION     both eyes  . ELECTROPHYSIOLOGIC STUDY N/A 05/10/2015   Procedure: SVT Ablation;  Surgeon: Will Meredith Leeds, MD;  Location: [facility];  Service: Cardiovascular;  Laterality: N/A;  . HERNIA REPAIR    . inguinal herniorrhapy  1984   left  . rectal fissure repair    . s/p benign breast biopsy  2003   right  . s/p left foot surgury  2009  . 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 spiral fx left foot  2008   no surgury  . TMJ ARTHROPLASTY  1989  . [place]- to remove scar tissue growth   . TONSILLECTOMY     Social History   Socioeconomic History  . Marital status: Divorced    Spouse name: 2 Step-children  . Number of children: 2  .  Years of education: Not on file  . Highest education level: Not on file  Occupational History  . Occupation: retired Banker  Tobacco Use  . Smoking status: Former Smoker    Packs/day: 1.00    Years: 54.00    Pack years: 54.00    Types: Cigarettes    Quit date: 01/03/2016    Years since quitting: 3.0  . Smokeless tobacco: Never Used  Substance and Sexual Activity  . Alcohol use: Yes    Comment: rare/ drinks socially  . Drug use: No  . Sexual activity: Never  Other Topics Concern  . Not on file  Social History Narrative   Patient gets no regular exercise   No biological children   2 step children   Social Determinants of Health   Financial Resource Strain: Low Risk   . Difficulty of Paying  Living Expenses: Not hard at all  Food Insecurity: No Food Insecurity  . Worried About Charity fundraiser in the Last Year: Never true  . Ran Out of Food in the Last Year: Never true  Transportation Needs: No Transportation Needs  . Lack of Transportation (Medical): No  . Lack of Transportation (Non-Medical): No  Physical Activity: Sufficiently Active  . Days of Exercise per Week: 6 days  . Minutes of Exercise per Session: 40 min  Stress: No Stress Concern Present  . Feeling of Stress : Not at all  Social Connections: Slightly Isolated  . Frequency of Communication with Friends and Family: More than three times a week  . Frequency of Social Gatherings with Friends and Family: More than three times a week  . Attends Religious Services: More than 4 times per year  . Active Member of Clubs or Organizations: Yes  . Attends Archivist Meetings: More than 4 times per year  . Marital Status: Divorced   Allergies  Allergen Reactions  . Bee Venom Swelling  . Clindamycin/Lincomycin Diarrhea and Nausea And Vomiting  . Codeine Nausea And Vomiting   Family History  Problem Relation Age of Onset  . Alcohol abuse Mother        ETOH dependence  . Hypertension Mother   . Stroke Mother   . Colon polyps Mother   . Diabetes Mother   . Pancreatic cancer Mother 31  . Uterine cancer Mother 23  . Lymphoma Brother        burkitts  . Lung cancer Paternal Uncle   . Lung cancer Maternal Grandmother 12       non-smoker  . Lung cancer Maternal Grandfather   . Breast cancer Cousin        maternal cousin, dx in her mid 64s  . Brain cancer Cousin        maternal cousin's son; dx in his 59s  . Testicular cancer Cousin        maternal cousin's son;   . Breast cancer Cousin        paternal cousin; dx in her 22s  . Breast cancer Cousin        1 maternal, 2 paternal  . Colon cancer Neg Hx   . Esophageal cancer Neg Hx   . Stomach cancer Neg Hx   . Rectal cancer Neg Hx       Current  Outpatient Medications (Respiratory):  .  diphenhydrAMINE (BENADRYL) 25 MG tablet, Take 25 mg by mouth every 6 (six) hours as needed (for bee stings).  .  fluticasone (FLONASE) 50 MCG/ACT nasal spray, Place 2 sprays  into both nostrils daily. Marland Kitchen  loratadine (CLARITIN) 10 MG tablet, Take 10 mg by mouth daily. .  pseudoephedrine (SUDAFED) 30 MG tablet, Take 30 mg by mouth every 4 (four) hours as needed for congestion.  .  Tiotropium Bromide Monohydrate (SPIRIVA RESPIMAT) 2.5 MCG/ACT AERS, Inhale 2 puffs into the lungs daily.  Current Outpatient Medications (Analgesics):  .  aspirin 81 MG EC tablet, Take 81 mg by mouth daily.   .  meloxicam (MOBIC) 15 MG tablet, TAKE 1 TABLET(15 MG) BY MOUTH DAILY. NEEDS APPOINTMENT FOR FUTURE REFILLS.   Current Outpatient Medications (Other):  .  calcium-vitamin D (OSCAL WITH D) 500-200 MG-UNIT per tablet, Take 1 tablet by mouth daily. .  cholecalciferol (VITAMIN D) 1000 UNITS tablet, Take 1,000 Units by mouth daily.   .  clotrimazole-betamethasone (LOTRISONE) cream, Apply 1 application topically 2 (two) times daily as needed. .  diclofenac sodium (VOLTAREN) 1 % GEL, Apply 4 g topically 4 (four) times daily as needed. .  Multiple Vitamin (MULTIVITAMIN) capsule, Take 1 capsule by mouth daily.   Marland Kitchen  omeprazole (PRILOSEC) 20 MG capsule, TAKE 1 CAPSULE(20 MG) BY MOUTH DAILY. Marland Kitchen  polyethylene glycol (MIRALAX / GLYCOLAX) packet, take 1 packet once daily if needed for constipation .  zolpidem (AMBIEN) 10 MG tablet, Take 1 tablet (10 mg total) by mouth at bedtime as needed.    Past medical history, social, surgical and family history all reviewed in electronic medical record.  No pertanent information unless stated regarding to the chief complaint.   Review of Systems:  No headache, visual changes, nausea, vomiting, diarrhea, constipation, dizziness, abdominal pain, skin rash, fevers, chills, night sweats, weight loss, swollen lymph nodes, body aches, joint swelling,   chest pain, shortness of breath, mood changes.  Positive muscle aches  Objective  Blood pressure 124/82, pulse 72, height 5' 3.75" (1.619 m), weight 170 lb (77.1 kg), SpO2 97 %.    General: No apparent distress alert and oriented x3 mood and affect normal, dressed appropriately.  HEENT: Pupils equal, extraocular movements intact  Respiratory: Patient's speak in full sentences and does not appear short of breath  Cardiovascular: No lower extremity edema, non tender, no erythema  Skin: Warm dry intact with no signs of infection or rash on extremities or on axial skeleton.   Abdomen: Soft nontender  Neuro: Cranial nerves II through XII are intact, neurovascularly intact in all extremities with 2+ DTRs and 2+ pulses.  Mild weakness in left upper extremity with significant edema of the left upper extremity Lymph: No lymphadenopathy of posterior or anterior cervical chain or axillae bilaterally. .  MSK: Significant arthritic changes of multiple joints.  Patient's left shoulder does have some atrophy noted of the shoulder girdle musculature.  Crepitus noted.  3+ out of 5 strength of the rotator cuff compared to the contralateral side.  Difficult to do the rest of exam secondary to the increase in the edema of the arm and the pain in the patient.  Procedure: Real-time Ultrasound Guided Injection of left glenohumeral joint Device: GE Logiq E  Ultrasound guided injection is preferred based studies that show increased duration, increased effect, greater accuracy, decreased procedural pain, increased response rate with ultrasound guided versus blind injection.  Verbal informed consent obtained.  Time-out conducted.  Noted no overlying erythema, induration, or other signs of local infection.  Skin prepped in a sterile fashion.  Local anesthesia: Topical Ethyl chloride.  With sterile technique and under real time ultrasound guidance:  Joint visualized.  21g 2  inch needle inserted posterior approach.  Pictures taken for needle placement. Patient did have injection of 2 cc of 0.5% Marcaine, and 1cc of Kenalog 40 mg/dL. Completed without difficulty  Pain immediately resolved suggesting accurate placement of the medication.  Advised to call if fevers/chills, erythema, induration, drainage, or persistent bleeding.  Images permanently stored and available for review in the ultrasound unit.  Impression: Technically successful ultrasound guided injection.   Impression and Recommendations:     This case required medical decision making of moderate complexity. The above documentation has been reviewed and is accurate and complete Lyndal Pulley, DO       Note: This dictation was prepared with Dragon dictation along with smaller phrase technology. Any transcriptional errors that result from this process are unintentional.

## 2019-02-04 ENCOUNTER — Other Ambulatory Visit: Payer: Self-pay | Admitting: Internal Medicine

## 2019-02-06 ENCOUNTER — Encounter: Payer: Self-pay | Admitting: Internal Medicine

## 2019-02-06 DIAGNOSIS — H3562 Retinal hemorrhage, left eye: Secondary | ICD-10-CM | POA: Diagnosis not present

## 2019-02-06 DIAGNOSIS — H35363 Drusen (degenerative) of macula, bilateral: Secondary | ICD-10-CM | POA: Diagnosis not present

## 2019-02-06 DIAGNOSIS — H40013 Open angle with borderline findings, low risk, bilateral: Secondary | ICD-10-CM | POA: Diagnosis not present

## 2019-02-06 DIAGNOSIS — H35033 Hypertensive retinopathy, bilateral: Secondary | ICD-10-CM | POA: Diagnosis not present

## 2019-02-10 ENCOUNTER — Encounter: Payer: Self-pay | Admitting: Internal Medicine

## 2019-02-10 ENCOUNTER — Ambulatory Visit: Payer: Self-pay

## 2019-02-10 ENCOUNTER — Ambulatory Visit (INDEPENDENT_AMBULATORY_CARE_PROVIDER_SITE_OTHER): Payer: Medicare Other | Admitting: Internal Medicine

## 2019-02-10 ENCOUNTER — Other Ambulatory Visit: Payer: Self-pay

## 2019-02-10 VITALS — BP 160/82 | HR 102 | Temp 98.1°F | Resp 12 | Ht 63.5 in | Wt 172.0 lb

## 2019-02-10 DIAGNOSIS — I10 Essential (primary) hypertension: Secondary | ICD-10-CM | POA: Diagnosis not present

## 2019-02-10 DIAGNOSIS — R739 Hyperglycemia, unspecified: Secondary | ICD-10-CM | POA: Diagnosis not present

## 2019-02-10 DIAGNOSIS — J449 Chronic obstructive pulmonary disease, unspecified: Secondary | ICD-10-CM

## 2019-02-10 DIAGNOSIS — Z23 Encounter for immunization: Secondary | ICD-10-CM | POA: Diagnosis not present

## 2019-02-10 DIAGNOSIS — E781 Pure hyperglyceridemia: Secondary | ICD-10-CM | POA: Diagnosis not present

## 2019-02-10 MED ORDER — AMLODIPINE BESYLATE 5 MG PO TABS: 5.0000 mg | ORAL_TABLET | Freq: Every day | ORAL | 3 refills | Status: DC

## 2019-02-10 NOTE — Telephone Encounter (Signed)
Patient called stating that her BP is elevated.  She states that she has never been Dx with hypertension.  Today BP 190/97 and then while speaking with me 182/99 Hr is 95.  She denies symptoms. No Headache, chest pain.  She states that she is fatigued. She states she started to monitor because she was told by another Dr she was showing signs of hypertension. Care advice read to patient.  She verbalized understanding. Call transferred to office for scheduling.  Reason for Disposition . Systolic BP  >= 99991111 OR Diastolic >= [id number]  Answer Assessment - Initial Assessment Questions 1. BLOOD PRESSURE: "What is the blood pressure?" "Did you take at least two measurements 5 minutes apart?"     190/97 , 182/99  HR 95 2. ONSET: "When did you take your blood pressure?"    today 3. HOW: "How did you obtain the blood pressure?" (e.g., visiting nurse, automatic home BP monitor)    Home machine 4. HISTORY: "Do you have a history of high blood pressure?"    No usually normal 5. MEDICATIONS: "Are you taking any medications for blood pressure?" "Have you missed any doses recently?"     No does not take medication 6. OTHER SYMPTOMS: "Do you have any symptoms?" (e.g., headache, chest pain, blurred vision, difficulty breathing, weakness)     Fatigue, 7. PREGNANCY: "Is there any chance you are pregnant?" "When was your last menstrual period?"     N/A  Protocols used: HIGH BLOOD PRESSURE-A-AH

## 2019-02-10 NOTE — Progress Notes (Signed)
Subjective:    Patient ID: Stacey [name], female    DOB: 10/09/1940, 79 y.o.   MRN: [id number]  HPI  Here to f/u; overall doing ok,  Pt denies chest pain, increasing sob or doe, wheezing, orthopnea, PND, increased LE swelling, palpitations, dizziness or syncope.  Pt denies new neurological symptoms such as new headache, or facial or extremity weakness or numbness.  Pt denies polydipsia, polyuria, or low sugar episode.  Pt states overall good compliance with meds, mostly trying to follow appropriate diet, with wt overall stable,  but little exercise however.  Had elevated BP at optho last wk.   BP Readings from Last 3 Encounters:  02/10/19 (!) 160/82  01/19/19 124/82  12/02/18 114/82  BP at home have rlrv 160's to 180's in the past wk when has been normal on several occasions at MD visits in aug - dec 2020.  Gained 20 lbs since quit smoking Past Medical History:  Diagnosis Date  . ANEMIA-NOS   . ANXIETY   . Breast cancer ([place]) 1997 L, 2012 R   s/p chemo/xrt  . COPD    resolved  . DIVERTICULOSIS, COLON 2008  . Dizziness   . GERD   . Hx of radiation therapy 10/19/11 -12/03/11   right breast  . HYPERLIPIDEMIA   . IRRITABLE BOWEL SYNDROME, HX OF   . Left-sided carotid artery disease (HCC)    moderate left ICA stenosis  . OSTEOARTHRITIS, HAND   . PSVT (paroxysmal supraventricular tachycardia) (HCC)    symptomatic on event monitor   Past Surgical History:  Procedure Laterality Date  . ABDOMINAL HYSTERECTOMY    . APPENDECTOMY    . BREAST BIOPSY  11/14/10    r breast: inv, insitu mammary carcinoma w/calcif, er/pr +, her2 -  . BREAST SURGERY     lumpectomy  . CATARACT EXTRACTION     both eyes  . ELECTROPHYSIOLOGIC STUDY N/A 05/10/2015   Procedure: SVT Ablation;  Surgeon: Will Meredith Leeds, MD;  Location: [facility];  Service: Cardiovascular;  Laterality: N/A;  . HERNIA REPAIR    . inguinal herniorrhapy  1984   left  . rectal fissure repair    . s/p benign breast biopsy   2003   right  . s/p left foot surgury  2009  . 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 spiral fx left foot  2008   no surgury  . TMJ ARTHROPLASTY  1989  . [place]- to remove scar tissue growth   . TONSILLECTOMY      reports that she quit smoking about 3 years ago. Her smoking use included cigarettes. She has a 54.00 pack-year smoking history. She has never used smokeless tobacco. She reports current alcohol use. She reports that she does not use drugs. family history includes Alcohol abuse in her mother; Brain cancer in her cousin; Breast cancer in her cousin, cousin, and cousin; Colon polyps in her mother; Diabetes in her mother; Hypertension in her mother; Lung cancer in her maternal grandfather and paternal uncle; Lung cancer (age of onset: 47) in her maternal grandmother; Lymphoma in her brother; Pancreatic cancer (age of onset: 83) in her mother; Stroke in her mother; Testicular cancer in her cousin; Uterine cancer (age of onset: 29) in her mother. Allergies  Allergen Reactions  . Bee Venom Swelling  . Clindamycin/Lincomycin Diarrhea and Nausea And Vomiting  . Codeine Nausea And Vomiting   Current Outpatient Medications on  File Prior to Visit  Medication Sig Dispense Refill  . aspirin 81 MG EC tablet Take 81 mg by mouth daily.      . calcium-vitamin D (OSCAL WITH D) 500-200 MG-UNIT per tablet Take 1 tablet by mouth daily.    . cholecalciferol (VITAMIN D) 1000 UNITS tablet Take 1,000 Units by mouth daily.      . clotrimazole-betamethasone (LOTRISONE) cream Apply 1 application topically 2 (two) times daily as needed. 30 g 1  . diclofenac sodium (VOLTAREN) 1 % GEL Apply 4 g topically 4 (four) times daily as needed. 400 g 11  . diphenhydrAMINE (BENADRYL) 25 MG tablet Take 25 mg by mouth every 6 (six) hours as needed (for bee stings).     . fluticasone (FLONASE) 50 MCG/ACT nasal spray Place 2 sprays into both nostrils daily.    Marland Kitchen loratadine (CLARITIN) 10 MG tablet  Take 10 mg by mouth daily.    . meloxicam (MOBIC) 15 MG tablet TAKE 1 TABLET(15 MG) BY MOUTH DAILY. NEEDS APPOINTMENT FOR FUTURE REFILLS. 90 tablet 0  . Multiple Vitamin (MULTIVITAMIN) capsule Take 1 capsule by mouth daily.      Marland Kitchen omeprazole (PRILOSEC) 20 MG capsule TAKE 1 CAPSULE(20 MG) BY MOUTH DAILY. 90 capsule 1  . polyethylene glycol (MIRALAX / GLYCOLAX) packet take 1 packet once daily if needed for constipation 30 packet 11  . Tiotropium Bromide Monohydrate (SPIRIVA RESPIMAT) 2.5 MCG/ACT AERS Inhale 2 puffs into the lungs daily. 4 g 11  . zolpidem (AMBIEN) 10 MG tablet TAKE 1 TABLET(10 MG) BY MOUTH AT BEDTIME AS NEEDED 90 tablet 1  . pseudoephedrine (SUDAFED) 30 MG tablet Take 30 mg by mouth every 4 (four) hours as needed for congestion.      No current facility-administered medications on file prior to visit.   Review of Systems  Constitutional: Negative for other unusual diaphoresis or sweats HENT: Negative for ear discharge or swelling Eyes: Negative for other worsening visual disturbances Respiratory: Negative for stridor or other swelling  Gastrointestinal: Negative for worsening distension or other blood Genitourinary: Negative for retention or other urinary change Musculoskeletal: Negative for other MSK pain or swelling Skin: Negative for color change or other new lesions Neurological: Negative for worsening tremors and other numbness  Psychiatric/Behavioral: Negative for worsening agitation or other fatigue All otherwise neg per pt     Objective:   Physical Exam BP (!) 160/82   Pulse (!) 102   Temp 98.1 F (36.7 C)   Resp 12   Ht 5' 3.5" (1.613 m)   Wt 172 lb (78 kg)   SpO2 96%   BMI 29.99 kg/m  VS noted,  Constitutional: Pt appears in NAD HENT: Head: NCAT.  Right Ear: External ear normal.  Left Ear: External ear normal.  Eyes: . Pupils are equal, round, and reactive to light. Conjunctivae and EOM are normal Nose: without d/c or deformity Neck: Neck supple.  Gross normal ROM Cardiovascular: Normal rate and regular rhythm.   Pulmonary/Chest: Effort normal and breath sounds without rales or wheezing.  Abd:  Soft, NT, ND, + BS, no organomegaly Neurological: Pt is alert. At baseline orientation, motor grossly intact Skin: Skin is warm. No rashes, other new lesions, no LE edema Psychiatric: Pt behavior is normal without agitation  All otherwise neg per pt  Lab Results  Component Value Date   WBC 7.8 07/13/2018   HGB 11.9 (L) 07/13/2018   HCT 35.1 (L) 07/13/2018   PLT 283.0 07/13/2018   GLUCOSE 110 (H) 07/13/2018  CHOL 193 07/13/2018   TRIG 231.0 (H) 07/13/2018   HDL 49.40 07/13/2018   LDLDIRECT 119.0 07/13/2018   LDLCALC 84 07/04/2009   ALT 16 07/13/2018   AST 22 07/13/2018   NA 143 07/13/2018   K 3.6 07/13/2018   CL 108 07/13/2018   CREATININE 0.95 07/13/2018   BUN 16 07/13/2018   CO2 24 07/13/2018   TSH 3.41 07/13/2018   HGBA1C 6.6 (H) 07/13/2018       Assessment & Plan:

## 2019-02-10 NOTE — Patient Instructions (Signed)
You had the Tdap tetanus shot today  Please take all new medication as prescribed - the amlodipine 5 mg per day  Please call in 4-6 wks if not improved to average < 140/90  Please continue all other medications as before, and refills have been done if requested.  Please have the pharmacy call with any other refills you may need.  Please continue your efforts at being more active, low cholesterol diet, and weight control  Please keep your appointments with your specialists as you may have planned

## 2019-02-11 ENCOUNTER — Encounter: Payer: Self-pay | Admitting: Internal Medicine

## 2019-02-11 ENCOUNTER — Other Ambulatory Visit: Payer: Self-pay | Admitting: Internal Medicine

## 2019-02-11 NOTE — Assessment & Plan Note (Signed)
Uncontrolled, start amlodipine 5 qd,  to f/u any worsening symptoms or concerns

## 2019-02-11 NOTE — Assessment & Plan Note (Signed)
stable overall by history and exam, recent data reviewed with pt, and pt to continue medical treatment as before,  to f/u any worsening symptoms or concerns  

## 2019-02-11 NOTE — Telephone Encounter (Signed)
Please refill as per office routine med refill policy (all routine meds refilled for 3 mo or monthly per pt preference up to one year from last visit, then month to month grace period for 3 mo, then further med refills will have to be denied)  

## 2019-02-13 ENCOUNTER — Other Ambulatory Visit: Payer: Self-pay

## 2019-02-13 MED ORDER — OMEPRAZOLE 20 MG PO CPDR: DELAYED_RELEASE_CAPSULE | ORAL | 0 refills | Status: DC

## 2019-03-28 ENCOUNTER — Other Ambulatory Visit: Payer: Self-pay | Admitting: *Deleted

## 2019-03-28 ENCOUNTER — Other Ambulatory Visit: Payer: Self-pay

## 2019-03-28 ENCOUNTER — Ambulatory Visit: Payer: Medicare Other | Attending: Oncology

## 2019-03-28 DIAGNOSIS — M25512 Pain in left shoulder: Secondary | ICD-10-CM | POA: Diagnosis not present

## 2019-03-28 DIAGNOSIS — I890 Lymphedema, not elsewhere classified: Secondary | ICD-10-CM | POA: Diagnosis not present

## 2019-03-28 DIAGNOSIS — Z86000 Personal history of in-situ neoplasm of breast: Secondary | ICD-10-CM | POA: Diagnosis not present

## 2019-03-28 DIAGNOSIS — M25612 Stiffness of left shoulder, not elsewhere classified: Secondary | ICD-10-CM | POA: Diagnosis not present

## 2019-03-28 DIAGNOSIS — C50211 Malignant neoplasm of upper-inner quadrant of right female breast: Secondary | ICD-10-CM

## 2019-03-28 NOTE — Patient Instructions (Signed)
Access Code: [email or web address]  URL: [email or web address]  Date: 03/28/2019  Prepared by: Tomma Rakers   Exercises Standing Upper Cervical Flexion and Extension - 20 reps - 1 sets - 1x daily - 7x weekly Standing Cervical Sidebending AROM - 20 reps - 1 sets - 1x daily - 7x weekly Standing Cervical Rotation AROM - 20 reps - 1 sets - 1x daily - 7x weekly Standing Shoulder Flexion Full Range - 20 reps - 1 sets - 1x daily - 7x weekly Standing Shoulder Shrugs - 20 reps - 1 sets - 1x daily - 7x weekly Standing Scapular Retraction - 20 reps - 1 sets - 1x daily - 7x weekly Trunk Sidebending with Compression Garment - 20 reps - 1 sets - 1x daily - 7x weekly Standing Hip Extension with Counter Support - 20 reps - 1 sets - 1x daily - 7x weekly Elbow AROM Flexion & Extension Supinated Forearm - 20 reps - 1 sets - 1x daily - 7x weekly Wrist AROM Flexion Extension - 20 reps - 1 sets - 1x daily - 7x weekly Hand Fist Pumps - 20 reps - 1 sets - 1x daily - 7x weekly

## 2019-03-28 NOTE — Therapy (Signed)
[place], [place], [address] Phone: [phone]   Fax:  [phone]  Physical Therapy Evaluation  Patient Details  Name: Stacey [name] MRN: [id number] Date of Birth: 1940/06/09 Referring Provider (PT): Lurline Del MD   Encounter Date: 03/28/2019  PT End of Session - 03/28/19 1657    Visit Number  1    Number of Visits  13    Date for PT Re-Evaluation  05/02/19    PT Start Time  1505    PT Stop Time  1600    PT Time Calculation (min)  55 min    Activity Tolerance  Patient tolerated treatment well    Behavior During Therapy  [facility] for tasks assessed/performed       Past Medical History:  Diagnosis Date  . ANEMIA-NOS   . ANXIETY   . Breast cancer ([place]) 1997 L, 2012 R   s/p chemo/xrt  . COPD    resolved  . DIVERTICULOSIS, COLON 2008  . Dizziness   . GERD   . Hx of radiation therapy 10/19/11 -12/03/11   right breast  . HYPERLIPIDEMIA   . IRRITABLE BOWEL SYNDROME, HX OF   . Left-sided carotid artery disease (HCC)    moderate left ICA stenosis  . OSTEOARTHRITIS, HAND   . PSVT (paroxysmal supraventricular tachycardia) (HCC)    symptomatic on event monitor    Past Surgical History:  Procedure Laterality Date  . ABDOMINAL HYSTERECTOMY    . APPENDECTOMY    . BREAST BIOPSY  11/14/10    r breast: inv, insitu mammary carcinoma w/calcif, er/pr +, her2 -  . BREAST SURGERY     lumpectomy  . CATARACT EXTRACTION     both eyes  . ELECTROPHYSIOLOGIC STUDY N/A 05/10/2015   Procedure: SVT Ablation;  Surgeon: Will Meredith Leeds, MD;  Location: [facility];  Service: Cardiovascular;  Laterality: N/A;  . HERNIA REPAIR    . inguinal herniorrhapy  1984   left  . rectal fissure repair    . s/p benign breast biopsy  2003   right  . s/p left foot surgury  2009  . 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 spiral fx left foot  2008   no surgury  . TMJ ARTHROPLASTY  1989  . [place]- to remove scar tissue growth   . TONSILLECTOMY      There were no vitals filed for this visit.   Subjective Assessment - 03/28/19 1509    Subjective  Pt reports that she had breats cancer in 1997 with 15 lymph node removal. She states that 7 of the lymph nodes were malignant. She states that in 2017 she started to get swelling in her L arm and had all kinds of diagnostic tests performed due to swelling and finally it was decided that she had  lymphedema in her LUE. She states that she was wearing a compression garment but she recently hadsome difficulty with increased swelling in her arm and has not been able to get an appropriate garment. She states that her digits 1-3 on the L hand have been numb since she has been swelling. She states that she has some pain in her L cervical spine that she was getting cortisone shots for which seemed to help the pain in her L arm. She is getting another cortisone shot on Thursday and goes about every 10 weeks.    Pertinent History  Hx breast cancer with 15 lymph node removal on the L and Bil lumpectomy. Lumpectomy on the L in 1997and the R in 2012. Radiation Bil    Patient Stated Goals  I want to take care of the swelling in my arm and learn what to do about it.    Currently in Pain?  No/denies   3/10        [facility] PT Assessment - 03/28/19 0001      Assessment   Medical Diagnosis  LUE lymphedema     Referring Provider (PT)  Lurline Del MD    Hand Dominance  Right    Prior Therapy  Not for lymphedema      Precautions   Precautions  Other (comment)    Precaution Comments  History of cancer      Balance Screen   Has the patient fallen in the past 6 months  No    Has the patient had a decrease in activity level because of a fear of falling?   No    Is the patient reluctant to leave their home because of a fear of falling?   No      Home Environment   Living Environment  Private residence    Living Arrangements  Alone    Type of [place] Access  Stairs to enter    Entrance Stairs-Number of Steps  1    Entrance Stairs-Rails  Right    Home Layout  One level      Cognition   Overall Cognitive Status  Within Functional Limits for tasks assessed      ROM / Strength   AROM / PROM / Strength  AROM      AROM   AROM Assessment Site  Shoulder    Right/Left Shoulder  Right;Left    Right Shoulder Flexion  147 Degrees    Right Shoulder ABduction  148 Degrees    Right Shoulder Internal Rotation  65 Degrees    Right Shoulder External Rotation  70 Degrees    Left Shoulder Flexion  80 Degrees    Left Shoulder ABduction  75 Degrees        LYMPHEDEMA/ONCOLOGY QUESTIONNAIRE - 03/28/19 1539      Type   Cancer Type  L breast cancer, R breast cancer      Surgeries   Lumpectomy Date  --   1997 and then in 2012   Number Lymph Nodes Removed  15      Treatment   Active Chemotherapy Treatment  No    Past Chemotherapy Treatment  Yes    Date  --   1997   Active Radiation Treatment  No    Past Radiation Treatment  Yes    Date  --   1997 and 2012   Body Site  Breast and axilla Bil    Current Hormone Treatment  No      What other symptoms do you have   Are you Having Heaviness or Tightness  Yes    Are you having Pain  Yes    Are you having pitting edema  No    Is it Hard or Difficult finding clothes that fit  Yes    Do you have infections  No    Is there Decreased scar mobility  No      Lymphedema Stage   Stage  STAGE 2 SPONTANEOUSLY IRREVERSIBLE      Lymphedema Assessments   Lymphedema Assessments  Upper extremities      Right Upper Extremity Lymphedema   15 cm Proximal to Olecranon Process  31 cm    10 cm Proximal to Olecranon Process  29 cm    Olecranon Process  24.2 cm    15 cm Proximal to Ulnar Styloid Process  23.8 cm    10 cm Proximal to Ulnar Styloid Process  20.9 cm    Just Proximal to Ulnar Styloid Process  16 cm    Across Hand at [employer]  17.5 cm    At [place] of 2nd Digit  6.5 cm       Left Upper Extremity Lymphedema   15 cm Proximal to Olecranon Process  34.8 cm    10 cm Proximal to Olecranon Process  38.5 cm    Olecranon Process  34 cm    15 cm Proximal to Ulnar Styloid Process  30.9 cm    10 cm Proximal to Ulnar Styloid Process  25.8 cm    Just Proximal to Ulnar Styloid Process  18.5 cm    Across Hand at [employer]  19 cm    At [place] of 2nd Digit  6.5 cm             Outpatient Rehab from 03/28/2019 [facility]  Lymphedema Life Impact Scale Total Score  45.59 %      Objective measurements completed on examination: See above findings.      [facility] Adult PT Treatment/Exercise - 03/28/19 0001      Exercises   Exercises  Other Exercises    Other Exercises   Demonstrated lymphedema exercises for the UE for pt and she performed following return demonstration 5 repetitions.              PT Education - 03/28/19 1656    Education Details  Access Code: [email or web address] pt was educated on lymphatic facilitation exercises for the UE. She was educated on the anatomy/physiology of the lymphatic system and CDT. Discussed the components of CDT including: skin care, exercise, compression and manual lymph drainage. Discussed different levels of compression and different types of garments.    Person(s) Educated  Patient    Methods  Explanation;Demonstration;Verbal cues;Handout    Comprehension  Verbalized understanding;Returned demonstration       PT Short Term Goals - 03/28/19 1714      PT SHORT TERM GOAL #1   Title  Pt will be independent with HEP and MLD within 2 weeks in order to decrease improve autonomy of care.    Baseline  Pt does not perform exercise or MLD.    Time  2    Period  Weeks    Status  New    Target Date  04/18/19        PT Long Term Goals - 03/28/19 1715      PT LONG TERM GOAL #1   Title  Patient will have reduction of LUE limb girth by 3 cm or greater in the proximal and distal  brachium/antebrachium within 4 weeks in order to demonstrate decrease edema.    Baseline  see measurements.    Time  4    Period  Weeks    Status  New    Target Date  05/02/19      PT LONG TERM GOAL #2   Title  Patient to be properly fitted with appropriate compression garment to wear on daily basis for home management.    Baseline  pt  currently has 20-30 mmHG arm garment, no glove    Time  4    Period  Weeks    Status  New    Target Date  05/02/19      PT LONG TERM GOAL #3   Title  Pt will improve A/ROM of the L shoulder to 120 degrees flexion/abduction within 6 weeks in order to decrease risk for immobility.    Baseline  80 degrees L shoulder flexion, 75 degrees L shoulder abduction.    Time  4    Period  Weeks    Status  New    Target Date  05/02/19      PT LONG TERM GOAL #4   Title  Pt will report 50% improvement in pain/heaviness in the LUE within 4 weeks in order to demonstrate an improvementin quality of life.    Baseline  pt reports 3/10 pain at the worst.    Time  4    Period  Weeks    Status  New    Target Date  05/02/19      PT LONG TERM GOAL #5   Title  Pt will improve LLIS to 30% or less within 4 weeks in order to demonstrate objective improvement in functional activitie.    Baseline  45.59%    Time  4    Period  Weeks    Status  New    Target Date  05/02/19             Plan - 03/28/19 1709    Clinical Impression Statement  Pt presents to physical therapy status post L lumpectomy in 1997 with 15 lymph node removal and radiation and R lumpectomy in 2012 with no lymphnodes removed and radiation. She states that in 2015 she started to notice edema in her arm and was trying to manage it with compression sleeves. Recently she has not been able to control her edema. She was wearing a 20-30 mmHG sleeve today when she arrived for physical therapy. Significant increase in circumferential measurements in the LUE compared to the R and significant loss of ROM in the L  shoulder compared to the R. Pt will benefit from skilled physical therapy services in order to decrease risk for immobility and infection related to edema 3x/week for 4 weeks for complete decongestive therapy.    Personal Factors and Comorbidities  Comorbidity 2    Comorbidities  Bil radiation due to Bil breast cancer and lumpectomy, 15 lymph node removal on the L    Stability/Clinical Decision Making  Stable/Uncomplicated    Clinical Decision Making  Low    Rehab Potential  Good    PT Frequency  3x / week    PT Duration  4 weeks    PT Treatment/Interventions  Therapeutic activities;Therapeutic exercise;Iontophoresis 57m/ml Dexamethasone;Patient/family education;Manual techniques;Neuromuscular re-education    PT Next Visit Plan  Begin CDT including wrapping and MLD    PT Home Exercise Plan  see pt instructions    Recommended Other Services  appropriate garment possibly night garment vs. heavier compression and pump    Consulted and Agree with Plan of Care  Patient       Patient will benefit from skilled therapeutic intervention in order to improve the following deficits and impairments:  Decreased range of motion, Pain, Increased edema  Visit Diagnosis: History of ductal carcinoma in situ (DCIS) of breast  Lymphedema, not elsewhere classified  Left shoulder pain, unspecified chronicity  Stiffness of left shoulder, not elsewhere classified  Problem List Patient Active Problem List   Diagnosis Date Noted  . HTN (hypertension) 02/10/2019  . Hyperglycemia 06/21/2018  . Left carpal tunnel syndrome 02/22/2018  . Rotator cuff arthropathy of left shoulder 09/20/2017  . Cervical radiculitis 04/09/2017  . Left shoulder pain 04/09/2017  . Dyspnea on exertion 05/14/2016  . 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 Lymphedema 01/14/2016  . Left arm swelling 12/25/2015  . SVT (supraventricular tachycardia) ([place])   . Left-sided carotid artery disease ([place])  03/08/2015  . Dizziness 01/24/2015  . Urinary frequency 01/13/2015  . Dizziness and giddiness 01/09/2015  . Gallstones 02/21/2013  . Breast cancer of upper-inner quadrant of right female breast ([place]) 11/28/2012  . Muscle cramping 09/12/2012  . Right hip pain 09/12/2012  . Lower back pain 09/12/2012  . COPD GOLD I    . Hx of radiation therapy   . Right shoulder pain 09/14/2011  . Preventative health care 07/29/2010  . Skin lesion of left leg 07/29/2010  . Paresthesia 07/29/2010  . GERD 03/28/2010  . CONSTIPATION 03/28/2010  . Osteoarthrosis, 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  . Anxiety state 05/03/2009  . Hyperlipidemia 06/14/2007  . FATIGUE 06/14/2007  . 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 DIVERTICULOSIS, COLON 12/17/2006  . Cough 12/17/2006  . IRRITABLE BOWEL SYNDROME, HX OF 12/17/2006    Ander Purpura, PT 03/28/2019, 5:19 PM  [place], [place], [address] Phone: [phone]   Fax:  [phone]  Name: Stacey [name] MRN: [id number] Date of Birth: Jul 19, 1940

## 2019-03-29 ENCOUNTER — Ambulatory Visit: Payer: Medicare Other

## 2019-03-30 ENCOUNTER — Ambulatory Visit: Payer: Self-pay

## 2019-03-30 ENCOUNTER — Other Ambulatory Visit: Payer: Self-pay

## 2019-03-30 ENCOUNTER — Ambulatory Visit (INDEPENDENT_AMBULATORY_CARE_PROVIDER_SITE_OTHER): Payer: Medicare Other | Admitting: Family Medicine

## 2019-03-30 ENCOUNTER — Encounter: Payer: Self-pay | Admitting: Family Medicine

## 2019-03-30 VITALS — BP 120/70 | HR 89 | Ht 63.5 in | Wt 174.0 lb

## 2019-03-30 DIAGNOSIS — M12812 Other specific arthropathies, not elsewhere classified, left shoulder: Secondary | ICD-10-CM

## 2019-03-30 DIAGNOSIS — G8929 Other chronic pain: Secondary | ICD-10-CM

## 2019-03-30 DIAGNOSIS — M7050 Other bursitis of knee, unspecified knee: Secondary | ICD-10-CM

## 2019-03-30 DIAGNOSIS — M25562 Pain in left knee: Secondary | ICD-10-CM | POA: Diagnosis not present

## 2019-03-30 NOTE — Patient Instructions (Addendum)
Good to see you Knee or thigh compression sleeve Exercise 3 times a week Use voltaren See me again in 6 week

## 2019-03-30 NOTE — Progress Notes (Signed)
[facility] [address] [place] Phone: [phone] Subjective:   I [name] am serving as a Education administrator for Dr. Hulan Saas.  This visit occurred during the SARS-CoV-2 public health emergency.  Safety protocols were in place, including screening questions prior to the visit, additional usage of staff PPE, and extensive cleaning of exam room while observing appropriate contact time as indicated for disinfecting solutions.   I'm seeing this patient by the request  of:  Biagio Borg, MD  CC: Left shoulder pain follow-up, left knee pain new problem  [id number]   01/19/2019 Repeat injection given today.  We discussed patient's lymphedema worsening in the upper extremity and we could do further work-up including laboratory work-up and potential imaging which patient declined.  Most recent mammogram was unremarkable.  Discussed different medications that could help with pain which patient also declined.  Patient understands that if worsening pain these are other options.  Patient will follow up with me in 10 weeks for another injection.  03/30/2019 Stacey [name] is a 79 y.o. female coming in with complaint of left shoulder pain. Patient states her shoulder is still painful. Injection helped. Left knee pain today. Patient has not noticed any swelling. Has been walking outside and noticed the pain. States the pain comes and goes.   Onset- Chronic  Location - posterior  Duration-  Character- sore  Aggravating factors- sitting, extension and sometimes flexion  Reliving factors-decreasing activity Therapies tried-discontinued walking Severity-7 out of 10     Past Medical History:  Diagnosis Date  . ANEMIA-NOS   . ANXIETY   . Breast cancer ([place]) 1997 L, 2012 R   s/p chemo/xrt  . COPD    resolved  . DIVERTICULOSIS, COLON 2008  . Dizziness   . GERD   . Hx of radiation therapy 10/19/11 -12/03/11   right breast  . HYPERLIPIDEMIA   .  IRRITABLE BOWEL SYNDROME, HX OF   . Left-sided carotid artery disease (HCC)    moderate left ICA stenosis  . OSTEOARTHRITIS, HAND   . PSVT (paroxysmal supraventricular tachycardia) (HCC)    symptomatic on event monitor   Past Surgical History:  Procedure Laterality Date  . ABDOMINAL HYSTERECTOMY    . APPENDECTOMY    . BREAST BIOPSY  11/14/10    r breast: inv, insitu mammary carcinoma w/calcif, er/pr +, her2 -  . BREAST SURGERY     lumpectomy  . CATARACT EXTRACTION     both eyes  . ELECTROPHYSIOLOGIC STUDY N/A 05/10/2015   Procedure: SVT Ablation;  Surgeon: Will Meredith Leeds, MD;  Location: [facility];  Service: Cardiovascular;  Laterality: N/A;  . HERNIA REPAIR    . inguinal herniorrhapy  1984   left  . rectal fissure repair    . s/p benign breast biopsy  2003   right  . s/p left foot surgury  2009  . 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 spiral fx left foot  2008   no surgury  . TMJ ARTHROPLASTY  1989  . [place]- to remove scar tissue growth   . TONSILLECTOMY     Social History   Socioeconomic History  . Marital status: Divorced    Spouse name: 2 Step-children  . Number of children: 2  . Years of education: Not on file  . Highest education level: Not on file  Occupational History  . Occupation: retired Banker  Tobacco Use  . Smoking status: Former Smoker    Packs/day: 1.00    Years: 54.00    Pack years: 54.00    Types: Cigarettes    Quit date: 01/03/2016    Years since quitting: 3.2  . Smokeless tobacco: Never Used  Substance and Sexual Activity  . Alcohol use: Yes    Comment: rare/ drinks socially  . Drug use: No  . Sexual activity: Never  Other Topics Concern  . Not on file  Social History Narrative   Patient gets no regular exercise   No biological children   2 step children   Social Determinants of Health   Financial Resource Strain: Low Risk   . Difficulty of Paying Living Expenses: Not hard at all  Food  Insecurity: No Food Insecurity  . Worried About Charity fundraiser in the Last Year: Never true  . Ran Out of Food in the Last Year: Never true  Transportation Needs: No Transportation Needs  . Lack of Transportation (Medical): No  . Lack of Transportation (Non-Medical): No  Physical Activity: Sufficiently Active  . Days of Exercise per Week: 6 days  . Minutes of Exercise per Session: 40 min  Stress: No Stress Concern Present  . Feeling of Stress : Not at all  Social Connections: Slightly Isolated  . Frequency of Communication with Friends and Family: More than three times a week  . Frequency of Social Gatherings with Friends and Family: More than three times a week  . Attends Religious Services: More than 4 times per year  . Active Member of Clubs or Organizations: Yes  . Attends Archivist Meetings: More than 4 times per year  . Marital Status: Divorced   Allergies  Allergen Reactions  . Bee Venom Swelling  . Clindamycin/Lincomycin Diarrhea and Nausea And Vomiting  . Codeine Nausea And Vomiting   Family History  Problem Relation Age of Onset  . Alcohol abuse Mother        ETOH dependence  . Hypertension Mother   . Stroke Mother   . Colon polyps Mother   . Diabetes Mother   . Pancreatic cancer Mother 84  . Uterine cancer Mother 54  . Lymphoma Brother        burkitts  . Lung cancer Paternal Uncle   . Lung cancer Maternal Grandmother 62       non-smoker  . Lung cancer Maternal Grandfather   . Breast cancer Cousin        maternal cousin, dx in her mid 46s  . Brain cancer Cousin        maternal cousin's son; dx in his 49s  . Testicular cancer Cousin        maternal cousin's son;   . Breast cancer Cousin        paternal cousin; dx in her 73s  . Breast cancer Cousin        1 maternal, 2 paternal  . Colon cancer Neg Hx   . Esophageal cancer Neg Hx   . Stomach cancer Neg Hx   . Rectal cancer Neg Hx      Current Outpatient Medications (Cardiovascular):  .   amLODipine (NORVASC) 5 MG tablet, Take 1 tablet (5 mg total) by mouth daily.  Current Outpatient Medications (Respiratory):  .  diphenhydrAMINE (BENADRYL) 25 MG tablet, Take 25 mg by mouth every 6 (six) hours as needed (for bee stings).  .  fluticasone (FLONASE) 50 MCG/ACT nasal spray, Place 2 sprays into both  nostrils daily. Marland Kitchen  loratadine (CLARITIN) 10 MG tablet, Take 10 mg by mouth daily. .  pseudoephedrine (SUDAFED) 30 MG tablet, Take 30 mg by mouth every 4 (four) hours as needed for congestion.  .  Tiotropium Bromide Monohydrate (SPIRIVA RESPIMAT) 2.5 MCG/ACT AERS, Inhale 2 puffs into the lungs daily.  Current Outpatient Medications (Analgesics):  .  aspirin 81 MG EC tablet, Take 81 mg by mouth daily.   .  meloxicam (MOBIC) 15 MG tablet, TAKE 1 TABLET(15 MG) BY MOUTH DAILY   Current Outpatient Medications (Other):  .  calcium-vitamin D (OSCAL WITH D) 500-200 MG-UNIT per tablet, Take 1 tablet by mouth daily. .  cholecalciferol (VITAMIN D) 1000 UNITS tablet, Take 1,000 Units by mouth daily.   .  clotrimazole-betamethasone (LOTRISONE) cream, Apply 1 application topically 2 (two) times daily as needed. .  diclofenac sodium (VOLTAREN) 1 % GEL, Apply 4 g topically 4 (four) times daily as needed. .  Multiple Vitamin (MULTIVITAMIN) capsule, Take 1 capsule by mouth daily.   Marland Kitchen  omeprazole (PRILOSEC) 20 MG capsule, TAKE 1 CAPSULE(20 MG) BY MOUTH DAILY. Marland Kitchen  polyethylene glycol (MIRALAX / GLYCOLAX) packet, take 1 packet once daily if needed for constipation .  zolpidem (AMBIEN) 10 MG tablet, TAKE 1 TABLET(10 MG) BY MOUTH AT BEDTIME AS NEEDED   Reviewed prior external information including notes and imaging from  primary care provider As well as notes that were available from care everywhere and other healthcare systems.  Past medical history, social, surgical and family history all reviewed in electronic medical record.  No pertanent information unless stated regarding to the chief complaint.    Review of Systems:  No headache, visual changes, nausea, vomiting, diarrhea, constipation, dizziness, abdominal pain, skin rash, fevers, chills, night sweats, weight loss, swollen lymph nodes, body aches, joint swelling, chest pain, shortness of breath, mood changes. POSITIVE muscle aches  Objective  Blood pressure 120/70, pulse 89, height 5' 3.5" (1.613 m), weight 174 lb (78.9 kg), SpO2 92 %.   General: No apparent distress alert and oriented x3 mood and affect normal, dressed appropriately.  HEENT: Pupils equal, extraocular movements intact  Respiratory: Patient's speak in full sentences and does not appear short of breath  Cardiovascular: No lower extremity edema, non tender, no erythema  Skin: Warm dry intact with no signs of infection or rash on extremities or on axial skeleton.  Abdomen: Soft nontender  Neuro: Cranial nerves II through XII are intact, neurovascularly intact in all extremities with 2+ DTRs and 2+ pulses.  Lymph: No lymphadenopathy of posterior or anterior cervical chain or axillae bilaterally.  Gait normal with good balance and coordination.  MSK: Arthritic changes, continues to have left upper extremity lymphedema patient wearing compression sleeve.  Shoulder still mild positive impingement with 4 out of 5 strength.  Mild crepitus noted but minimal pain on exam today.  Left knee exam does have tenderness to palpation more over the medial joint line, popliteal area and the Pes anserine area.  Very mild pain with valgus stress.  Minimal instability.  Mild lateral tracking of the patella noted.  Limited musculoskeletal ultrasound was performed and interpreted by Lyndal Pulley  Limited ultrasound of patient's left knee shows moderate arthritic changes but no true joint effusion noted.  Patient does still have what appears to be a very small tear in the distal hamstring tendon with cyst formation. Impression: Hamstring tear    Impression and Recommendations:      This case required medical decision making of  moderate complexity. The above documentation has been reviewed and is accurate and complete Lyndal Pulley, DO       Note: This dictation was prepared with Dragon dictation along with smaller phrase technology. Any transcriptional errors that result from this process are unintentional.

## 2019-03-31 ENCOUNTER — Encounter: Payer: Self-pay | Admitting: Family Medicine

## 2019-03-31 DIAGNOSIS — M7050 Other bursitis of knee, unspecified knee: Secondary | ICD-10-CM | POA: Insufficient documentation

## 2019-03-31 NOTE — Assessment & Plan Note (Signed)
At moment patient seems to be stable and will consider repeating injection again in follow-up in 6 weeks

## 2019-03-31 NOTE — Assessment & Plan Note (Signed)
New problem.  Seems to be more of a pes anserine bursitis.  Topical anti-inflammatories prescribed.  Discussed compression sleeve, home exercises at work with Product/process development scientist.  Mild underlying arthritic changes could also be contributing.  Patient has many different comorbidities which makes treatment difficult in one of her many social determinants of health.  Patient will try conservative therapy worsening symptoms consider the possibility of formal physical therapy or potential injection.

## 2019-04-03 ENCOUNTER — Ambulatory Visit: Payer: Medicare Other

## 2019-04-05 ENCOUNTER — Other Ambulatory Visit: Payer: Self-pay

## 2019-04-05 ENCOUNTER — Ambulatory Visit: Payer: Medicare Other | Attending: Oncology

## 2019-04-05 DIAGNOSIS — M25612 Stiffness of left shoulder, not elsewhere classified: Secondary | ICD-10-CM | POA: Diagnosis not present

## 2019-04-05 DIAGNOSIS — I890 Lymphedema, not elsewhere classified: Secondary | ICD-10-CM | POA: Diagnosis not present

## 2019-04-05 DIAGNOSIS — M25512 Pain in left shoulder: Secondary | ICD-10-CM | POA: Insufficient documentation

## 2019-04-05 DIAGNOSIS — Z86000 Personal history of in-situ neoplasm of breast: Secondary | ICD-10-CM | POA: Insufficient documentation

## 2019-04-05 NOTE — Patient Instructions (Signed)

## 2019-04-05 NOTE — Therapy (Signed)
[place], [place], [address] Phone: [phone]   Fax:  [phone]  Physical Therapy Treatment  Patient Details  Name: Stacey [name] MRN: [id number] Date of Birth: [birth year] Referring Provider (PT): Lurline Del MD   Encounter Date: 04/05/2019  PT End of Session - 04/05/19 1504    Visit Number  2    Number of Visits  13    Date for PT Re-Evaluation  05/02/19    PT Start Time  1502    PT Stop Time  1600    PT Time Calculation (min)  58 min    Activity Tolerance  Patient tolerated treatment well    Behavior During Therapy  [facility] for tasks assessed/performed       Past Medical History:  Diagnosis Date  . ANEMIA-NOS   . ANXIETY   . Breast cancer ([place]) 1997 L, 2012 R   s/p chemo/xrt  . COPD    resolved  . DIVERTICULOSIS, COLON 2008  . Dizziness   . GERD   . Hx of radiation therapy 10/19/11 -12/03/11   right breast  . HYPERLIPIDEMIA   . IRRITABLE BOWEL SYNDROME, HX OF   . Left-sided carotid artery disease (HCC)    moderate left ICA stenosis  . OSTEOARTHRITIS, HAND   . PSVT (paroxysmal supraventricular tachycardia) (HCC)    symptomatic on event monitor    Past Surgical History:  Procedure Laterality Date  . ABDOMINAL HYSTERECTOMY    . APPENDECTOMY    . BREAST BIOPSY  11/14/10    r breast: inv, insitu mammary carcinoma w/calcif, er/pr +, her2 -  . BREAST SURGERY     lumpectomy  . CATARACT EXTRACTION     both eyes  . ELECTROPHYSIOLOGIC STUDY N/A 05/10/2015   Procedure: SVT Ablation;  Surgeon: Will Meredith Leeds, MD;  Location: [facility];  Service: Cardiovascular;  Laterality: N/A;  . HERNIA REPAIR    . inguinal herniorrhapy  1984   left  . rectal fissure repair    . s/p benign breast biopsy  2003   right  . s/p left foot surgury  2009  . 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 spiral fx left foot  2008   no surgury  . TMJ ARTHROPLASTY  1989  . [place]- to remove scar tissue growth   . TONSILLECTOMY      There were no vitals filed for this visit.  Subjective Assessment - 04/05/19 1505    Subjective  Pt reports that she feels about the same. She reports that she went to the MD but did not get an injection like she had planned.    Pertinent History  Hx breast cancer with 15 lymph node removal on the L and Bil lumpectomy. Lumpectomy on the L in 1997and the R in 2012. Radiation Bil    Patient Stated Goals  I want to take care of the swelling in my arm and learn what to do about it.    Currently in Pain?  No/denies   9/10 with slight abduction                 Outpatient Re[facility]  Lymphedema Life Impact Scale Total Score  45.59 %           [facility] Adult PT Treatment/Exercise - 04/05/19 0001      Manual Therapy   Manual Therapy  Manual Lymphatic Drainage (MLD);Compression Bandaging;Myofascial release    Myofascial Release  longitudinal on the LUEdue to cordinad and at the inferior L breast due to tenderness most likely related to cording.     Manual Lymphatic Drainage (MLD)  In supine: short neck, swimming in the terminus, Bil axillary nodes, L inguinal nodes, L axillo-inguinal anastomosis, L shoulder, Lateral brachium, medial to lateral brachium, lateral rachium, antecubital fossa, all surfaces of the antebrachium, dorsum of the hand, reworked all surfaces then deep abdominals.     Compression Bandaging  Pt was wrapped using TG soft as a base layer 2 artiflex for shaping, 3 inch elasomll folded at the fingers, 3 short stretch bandages without pull for the arm due to pt currently has numbness in the LUE.              PT Education - 04/05/19 1605    Education Details  Pt will continue with exercises at home. She was provided with information on care and reasons for removal of bandages (see pt instructions)    Person(s) Educated  Patient    Methods   Explanation;Handout    Comprehension  Verbalized understanding       PT Short Term Goals - 03/28/19 1714      PT SHORT TERM GOAL #1   Title  Pt will be independent with HEP and MLD within 2 weeks in order to decrease improve autonomy of care.    Baseline  Pt does not perform exercise or MLD.    Time  2    Period  Weeks    Status  New    Target Date  04/18/19        PT Long Term Goals - 03/28/19 1715      PT LONG TERM GOAL #1   Title  Patient will have reduction of LUE limb girth by 3 cm or greater in the proximal and distal brachium/antebrachium within 4 weeks in order to demonstrate decrease edema.    Baseline  see measurements.    Time  4    Period  Weeks    Status  New    Target Date  05/02/19      PT LONG TERM GOAL #2   Title  Patient to be properly fitted with appropriate compression garment to wear on daily basis for home management.    Baseline  pt currently has 20-30 mmHG arm garment, no glove    Time  4    Period  Weeks    Status  New    Target Date  05/02/19      PT LONG TERM GOAL #3   Title  Pt will improve A/ROM of the L shoulder to 120 degrees flexion/abduction within 6 weeks in order to decrease risk for immobility.    Baseline  80 degrees L shoulder flexion, 75 degrees L shoulder abduction.    Time  4    Period  Weeks    Status  New    Target Date  05/02/19      PT LONG TERM GOAL #4   Title  Pt will report 50% improvement in pain/heaviness in the LUE within 4 weeks in order to demonstrate an improvementin quality of life.    Baseline  pt reports 3/10 pain at the worst.    Time  4    Period  Weeks    Status  New    Target Date  05/02/19      PT LONG TERM GOAL #5  Title  Pt will improve LLIS to 30% or less within 4 weeks in order to demonstrate objective improvement in functional activitie.    Baseline  45.59%    Time  4    Period  Weeks    Status  New    Target Date  05/02/19            Plan - 04/05/19 1504    Clinical Impression  Statement  Pt continues with edema in her LUE. MLD was performed for the LUE using only the L axillo-inguinal anastomosis due to pt has previous breast surgery on the R as well. Longitudinal myofacial release was performed at the LUE in slight abduction due to cord noted in the antecubital fossa and at the inferior L breast due to reported pain. Pt was wrapped with layered compression wrap on the LUE with no pull due to pt already has numbness in this arm due to sweling and neck injury. Discussed other signs to watch for including discoloration of the fingers, throbbing, or swelling. She was provided with reasons to remove the bandages. Pt will benefit from continued POC at this time.    Personal Factors and Comorbidities  Comorbidity 2    Comorbidities  Bil radiation due to Bil breast cancer and lumpectomy, 15 lymph node removal on the L    PT Frequency  3x / week    PT Duration  4 weeks    PT Treatment/Interventions  Therapeutic activities;Therapeutic exercise;Iontophoresis 38m/ml Dexamethasone;Patient/family education;Manual techniques;Neuromuscular re-education    PT Next Visit Plan  Continue CDT including wrapping and MLD and myofascial release    PT Home Exercise Plan  see pt instructions    Consulted and Agree with Plan of Care  Patient       Patient will benefit from skilled therapeutic intervention in order to improve the following deficits and impairments:  Decreased range of motion, Pain, Increased edema  Visit Diagnosis: History of ductal carcinoma in situ (DCIS) of breast  Lymphedema, not elsewhere classified  Left shoulder pain, unspecified chronicity  Stiffness of left shoulder, not elsewhere classified     Problem List Patient Active Problem List   Diagnosis Date Noted  . Pes anserine bursitis 03/31/2019  . HTN (hypertension) 02/10/2019  . Hyperglycemia 06/21/2018  . Left carpal tunnel syndrome 02/22/2018  . Rotator cuff arthropathy of left shoulder 09/20/2017  .  Cervical radiculitis 04/09/2017  . Left shoulder pain 04/09/2017  . Dyspnea on exertion 05/14/2016  . 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 Lymphedema 01/14/2016  . Left arm swelling 12/25/2015  . SVT (supraventricular tachycardia) ([facility]   . Left-sided carotid artery disease ([facility] 03/08/2015  . Dizziness 01/24/2015  . Urinary frequency 01/13/2015  . Dizziness and giddiness 01/09/2015  . Gallstones 02/21/2013  . Breast cancer of upper-inner quadrant of right [place] breast ([place] 11/28/2012  . Muscle cramping 09/12/2012  . Right hip pain 09/12/2012  . Lower back pain 09/12/2012  . COPD GOLD I    . Hx of radiation therapy   . Right shoulder pain 09/14/2011  . Preventative health care 07/29/2010  . Skin lesion of left leg 07/29/2010  . Paresthesia 07/29/2010  . GERD 03/28/2010  . CONSTIPATION 03/28/2010  . Osteoarthrosis, 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  . Anxiety state 05/03/2009  . Hyperlipidemia 06/14/2007  . FATIGUE 06/14/2007  . 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 DIVERTICULOSIS, COLON 12/17/2006  . Cough 12/17/2006  . IRRITABLE BOWEL SYNDROME, HX OF 12/17/2006  Ander Purpura, PT 04/05/2019, 5:26 PM  [place], [place], [address] Phone: [phone]   Fax:  [phone]  Name: Stacey [name] MRN: [id number] Date of Birth: 1940/06/14

## 2019-04-07 ENCOUNTER — Ambulatory Visit: Payer: Medicare Other

## 2019-04-07 ENCOUNTER — Other Ambulatory Visit: Payer: Self-pay

## 2019-04-07 DIAGNOSIS — M25612 Stiffness of left shoulder, not elsewhere classified: Secondary | ICD-10-CM

## 2019-04-07 DIAGNOSIS — M25512 Pain in left shoulder: Secondary | ICD-10-CM

## 2019-04-07 DIAGNOSIS — Z86000 Personal history of in-situ neoplasm of breast: Secondary | ICD-10-CM | POA: Diagnosis not present

## 2019-04-07 DIAGNOSIS — I890 Lymphedema, not elsewhere classified: Secondary | ICD-10-CM | POA: Diagnosis not present

## 2019-04-07 NOTE — Therapy (Signed)
[facility] [address] [place], [place], [address] Phone: [phone]   Fax:  [phone]  Physical Therapy Treatment  Patient Details  Name: Stacey [name] MRN: [id number] Date of Birth: 06/10/1940 Referring Provider (PT): Gustav Magrinat MD   Encounter Date: 04/07/2019  PT End of Session - 04/07/19 0911    Visit Number  3    Number of Visits  13    Date for PT Re-Evaluation  05/02/19    PT Start Time  0910    PT Stop Time  1003    PT Time Calculation (min)  53 min    Activity Tolerance  Patient tolerated treatment well    Behavior During Therapy  WFL for tasks assessed/performed       Past Medical History:  Diagnosis Date  . ANEMIA-NOS   . ANXIETY   . Breast cancer (HCC) 1997 L, 2012 R   s/p chemo/xrt  . COPD    resolved  . DIVERTICULOSIS, COLON 2008  . Dizziness   . GERD   . Hx of radiation therapy 10/19/11 -12/03/11   right breast  . HYPERLIPIDEMIA   . IRRITABLE BOWEL SYNDROME, HX OF   . Left-sided carotid artery disease (HCC)    moderate left ICA stenosis  . OSTEOARTHRITIS, HAND   . PSVT (paroxysmal supraventricular tachycardia) (HCC)    symptomatic on event monitor    Past Surgical History:  Procedure Laterality Date  . ABDOMINAL HYSTERECTOMY    . APPENDECTOMY    . BREAST BIOPSY  11/14/10    r breast: inv, insitu mammary carcinoma w/calcif, er/pr +, her2 -  . BREAST SURGERY     lumpectomy  . CATARACT EXTRACTION     both eyes  . ELECTROPHYSIOLOGIC STUDY N/A 05/10/2015   Procedure: SVT Ablation;  Surgeon: Will Martin Camnitz, MD;  Location: [facility];  Service: Cardiovascular;  Laterality: N/A;  . HERNIA REPAIR    . inguinal herniorrhapy  1984   left  . rectal fissure repair    . s/p benign breast biopsy  2003   right  . s/p left foot surgury  2009  . 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 spiral fx left foot  2008   no surgury  . TMJ ARTHROPLASTY  1989  . TONGUE SURGERY     1988- to remove scar tissue growth   . TONSILLECTOMY      There were no vitals filed for this visit.  Subjective Assessment - 04/07/19 0911    Subjective  Pt states that the fingers began to unravel some but other than that her wrap felt okay.    Pertinent History  Hx breast cancer with 15 lymph node removal on the L and Bil lumpectomy. Lumpectomy on the L in 1997and the R in 2012. Radiation Bil    Patient Stated Goals  I want to take care of the swelling in my arm and learn what to do about it.    Currently in Pain?  No/denies    Multiple Pain Sites  No                  [facility]  Lymphedema Life Impact Scale Total Score  45.59 %           [facility] Adult PT Treatment/Exercise - 04/07/19 0001      Manual Therapy   Manual Therapy  Manual Lymphatic Drainage (MLD);Compression Bandaging;Myofascial   release    Manual Lymphatic Drainage (MLD)  In supine: short neck, swimming in the terminus, Bil axillary nodes, L inguinal nodes, L axillo-inguinal anastomosis, L shoulder, Lateral brachium, medial to lateral brachium, lateral brachium, antecubital fossa, all surfaces of the antebrachium, dorsum of the hand, reworked all surfaces then deep abdominals.     Compression Bandaging  Pt was wrapped using Artiflex as a base layer 2 artiflex for shaping, 1/2 inch gray foam at the antecubital fossa, 2 inch elastomll folded at the fingers, 4 short stretch bandages with light pull for the arm due to pt currently has numbness in the LUE. Her last wrap did well, no impairment so circulation or increased numbness.              PT Education - 04/07/19 1039    Education Details  Pt will continue with her exercises at home. Re-iterated education on when to remove the wrap    Person(s) Educated  Patient    Methods  Explanation    Comprehension  Verbalized understanding       PT Short Term Goals - 03/28/19 1714      PT  SHORT TERM GOAL #1   Title  Pt will be independent with HEP and MLD within 2 weeks in order to decrease improve autonomy of care.    Baseline  Pt does not perform exercise or MLD.    Time  2    Period  Weeks    Status  New    Target Date  04/18/19        PT Long Term Goals - 03/28/19 1715      PT LONG TERM GOAL #1   Title  Patient will have reduction of LUE limb girth by 3 cm or greater in the proximal and distal brachium/antebrachium within 4 weeks in order to demonstrate decrease edema.    Baseline  see measurements.    Time  4    Period  Weeks    Status  New    Target Date  05/02/19      PT LONG TERM GOAL #2   Title  Patient to be properly fitted with appropriate compression garment to wear on daily basis for home management.    Baseline  pt currently has 20-30 mmHG arm garment, no glove    Time  4    Period  Weeks    Status  New    Target Date  05/02/19      PT LONG TERM GOAL #3   Title  Pt will improve A/ROM of the L shoulder to 120 degrees flexion/abduction within 6 weeks in order to decrease risk for immobility.    Baseline  80 degrees L shoulder flexion, 75 degrees L shoulder abduction.    Time  4    Period  Weeks    Status  New    Target Date  05/02/19      PT LONG TERM GOAL #4   Title  Pt will report 50% improvement in pain/heaviness in the LUE within 4 weeks in order to demonstrate an improvementin quality of life.    Baseline  pt reports 3/10 pain at the worst.    Time  4    Period  Weeks    Status  New    Target Date  05/02/19      PT LONG TERM GOAL #5   Title  Pt will improve LLIS to 30% or less within 4 weeks in order to demonstrate objective   improvement in functional activitie.    Baseline  45.59%    Time  4    Period  Weeks    Status  New    Target Date  05/02/19            Plan - 04/07/19 0911    Clinical Impression Statement  Pt presents with band of indentation at Her L proximal brachium due to TG soft rolling. Artiflex was used as the  base layer to prevent this today. 1/2 inch gray foam was used at the antecubital fossa and was provided a piece for her L lateral breast due to edema in this area. 1 extra short stretch bandages was added for added compression due to significant fibrotic tissue in the L antebrachium. Performed MLD followed by multi-layer compression wrap. Pt will benefit from continued POC at this time.    Personal Factors and Comorbidities  Comorbidity 2    Comorbidities  Bil radiation due to Bil breast cancer and lumpectomy, 15 lymph node removal on the L    Rehab Potential  Good    PT Frequency  3x / week    PT Duration  4 weeks    PT Treatment/Interventions  Therapeutic activities;Therapeutic exercise;Iontophoresis 4mg/ml Dexamethasone;Patient/family education;Manual techniques;Neuromuscular re-education    PT Next Visit Plan  Continue CDT including wrapping and MLD and myofascial release    PT Home Exercise Plan  see pt instructions    Consulted and Agree with Plan of Care  Patient       Patient will benefit from skilled therapeutic intervention in order to improve the following deficits and impairments:  Decreased range of motion, Pain, Increased edema  Visit Diagnosis: History of ductal carcinoma in situ (DCIS) of breast  Lymphedema, not elsewhere classified  Left shoulder pain, unspecified chronicity  Stiffness of left shoulder, not elsewhere classified     Problem List Patient Active Problem List   Diagnosis Date Noted  . Pes anserine bursitis 03/31/2019  . HTN (hypertension) 02/10/2019  . Hyperglycemia 06/21/2018  . Left carpal tunnel syndrome 02/22/2018  . Rotator cuff arthropathy of left shoulder 09/20/2017  . Cervical radiculitis 04/09/2017  . Left shoulder pain 04/09/2017  . Dyspnea on exertion 05/14/2016  . 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 Lymphedema 01/14/2016  . Left arm swelling 12/25/2015  . SVT (supraventricular tachycardia) (HCC)   .  Left-sided carotid artery disease (HCC) 03/08/2015  . Dizziness 01/24/2015  . Urinary frequency 01/13/2015  . Dizziness and giddiness 01/09/2015  . Gallstones 02/21/2013  . Breast cancer of upper-inner quadrant of right female breast (HCC) 11/28/2012  . Muscle cramping 09/12/2012  . Right hip pain 09/12/2012  . Lower back pain 09/12/2012  . COPD GOLD I    . Hx of radiation therapy   . Right shoulder pain 09/14/2011  . Preventative health care 07/29/2010  . Skin lesion of left leg 07/29/2010  . Paresthesia 07/29/2010  . GERD 03/28/2010  . CONSTIPATION 03/28/2010  . Osteoarthrosis, 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  . Anxiety state 05/03/2009  . Hyperlipidemia 06/14/2007  . FATIGUE 06/14/2007  . 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 DIVERTICULOSIS, COLON 12/17/2006  . Cough 12/17/2006  . IRRITABLE BOWEL SYNDROME, HX OF 12/17/2006    Catherine H Healy, PT 04/07/2019, 10:43 AM  [facility] [address] [place], [place], [address] Phone: [phone]   Fax:  [phone]  Name: Stacey [name] MRN: [id number] Date of Birth: 03/26/1940   

## 2019-04-10 ENCOUNTER — Other Ambulatory Visit: Payer: Self-pay

## 2019-04-10 ENCOUNTER — Ambulatory Visit: Payer: Medicare Other

## 2019-04-10 DIAGNOSIS — Z86000 Personal history of in-situ neoplasm of breast: Secondary | ICD-10-CM

## 2019-04-10 DIAGNOSIS — M25612 Stiffness of left shoulder, not elsewhere classified: Secondary | ICD-10-CM

## 2019-04-10 DIAGNOSIS — I890 Lymphedema, not elsewhere classified: Secondary | ICD-10-CM | POA: Diagnosis not present

## 2019-04-10 DIAGNOSIS — M25512 Pain in left shoulder: Secondary | ICD-10-CM

## 2019-04-10 NOTE — Therapy (Signed)
[place], [place], [address] Phone: [phone]   Fax:  [phone]  Physical Therapy Treatment  Patient Details  Name: Stacey [name] MRN: [id number] Date of Birth: 1940-07-09 Referring Provider (PT): Lurline Del MD   Encounter Date: 04/10/2019  PT End of Session - 04/10/19 1510    Visit Number  4    Number of Visits  13    Date for PT Re-Evaluation  05/02/19    PT Start Time  1505    PT Stop Time  1600    PT Time Calculation (min)  55 min    Activity Tolerance  Patient tolerated treatment well    Behavior During Therapy  [facility] for tasks assessed/performed       Past Medical History:  Diagnosis Date  . ANEMIA-NOS   . ANXIETY   . Breast cancer ([place]) 1997 L, 2012 R   s/p chemo/xrt  . COPD    resolved  . DIVERTICULOSIS, COLON 2008  . Dizziness   . GERD   . Hx of radiation therapy 10/19/11 -12/03/11   right breast  . HYPERLIPIDEMIA   . IRRITABLE BOWEL SYNDROME, HX OF   . Left-sided carotid artery disease (HCC)    moderate left ICA stenosis  . OSTEOARTHRITIS, HAND   . PSVT (paroxysmal supraventricular tachycardia) (HCC)    symptomatic on event monitor    Past Surgical History:  Procedure Laterality Date  . ABDOMINAL HYSTERECTOMY    . APPENDECTOMY    . BREAST BIOPSY  11/14/10    r breast: inv, insitu mammary carcinoma w/calcif, er/pr +, her2 -  . BREAST SURGERY     lumpectomy  . CATARACT EXTRACTION     both eyes  . ELECTROPHYSIOLOGIC STUDY N/A 05/10/2015   Procedure: SVT Ablation;  Surgeon: Will Meredith Leeds, MD;  Location: [facility];  Service: Cardiovascular;  Laterality: N/A;  . HERNIA REPAIR    . inguinal herniorrhapy  1984   left  . rectal fissure repair    . s/p benign breast biopsy  2003   right  . s/p left foot surgury  2009  . 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 spiral fx left foot  2008   no surgury  . TMJ ARTHROPLASTY  1989  . [place]- to remove scar tissue growth   . TONSILLECTOMY      There were no vitals filed for this visit.  Subjective Assessment - 04/10/19 1510    Subjective  Pt states that her pinky finger was bothering her and then by sunday her forearm was really bothering her and felt like it was swelling. She left her wrap on but felt very uncomfortable. She states she still left it on and tried to do her exercises. She removed her wrap at 12:30 pm today.    Pertinent History  Hx breast cancer with 15 lymph node removal on the L and Bil lumpectomy. Lumpectomy on the L in 1997and the R in 2012. Radiation Bil    Patient Stated Goals  I want to take care of the swelling in my arm and learn what to do about it.    Currently in Pain?  No/denies    Multiple Pain Sites  No            LYMPHEDEMA/ONCOLOGY QUESTIONNAIRE - 04/10/19 1512      Left Upper Extremity Lymphedema   15 cm Proximal to  Olecranon Process  35.2 cm    10 cm Proximal to Olecranon Process  36 cm    Olecranon Process  29.9 cm    15 cm Proximal to Ulnar Styloid Process  29.4 cm    10 cm Proximal to Ulnar Styloid Process  26 cm    Just Proximal to Ulnar Styloid Process  19.4 cm    Across Hand at [employer]  18.4 cm    At [place] of 2nd Digit  6.4 cm           Outpatient [facility]/28/[facility]  Lymphedema Life Impact Scale Total Score  45.59 %           [facility] Adult PT Treatment/Exercise - 04/10/19 0001      Manual Therapy   Manual Therapy  Manual Lymphatic Drainage (MLD);Compression Bandaging    Manual Lymphatic Drainage (MLD)  In supine: short neck, swimming in the terminus, Bil axillary nodes, L inguinal nodes, L axillo-inguinal anastomosis, L shoulder, Lateral brachium, medial to lateral brachium, lateral brachium, antecubital fossa, all surfaces of the antebrachium, dorsum of the hand, reworked all surfaces then deep abdominals.     Compression Bandaging  Pt was wrapped  using Artiflex as a base layer 2 artiflex for shaping, 1/2 inch gray foam at the dorsum of the hand and 1/4 inch gray foam at the anteiror antebrachium to the antecubital fossa and on the lateral brachium , 2 inch elastomull folded at the fingers, 4 short stretch bandages with light pull for the arm due to pt currently has numbness in the LUE. Her last wrap did well, no impairment so circulation or increased numbness. Extra time spent to get all of the air out of the foam.              PT Education - 04/10/19 1721    Education Details  Re-iterated reasons to remove the wrap. She will continue with exercises at home.    Person(s) Educated  Patient    Methods  Explanation    Comprehension  Verbalized understanding       PT Short Term Goals - 03/28/19 1714      PT SHORT TERM GOAL #1   Title  Pt will be independent with HEP and MLD within 2 weeks in order to decrease improve autonomy of care.    Baseline  Pt does not perform exercise or MLD.    Time  2    Period  Weeks    Status  New    Target Date  04/18/19        PT Long Term Goals - 03/28/19 1715      PT LONG TERM GOAL #1   Title  Patient will have reduction of LUE limb girth by 3 cm or greater in the proximal and distal brachium/antebrachium within 4 weeks in order to demonstrate decrease edema.    Baseline  see measurements.    Time  4    Period  Weeks    Status  New    Target Date  05/02/19      PT LONG TERM GOAL #2   Title  Patient to be properly fitted with appropriate compression garment to wear on daily basis for home management.    Baseline  pt currently has 20-30 mmHG arm garment, no glove    Time  4    Period  Weeks    Status  New    Target Date  05/02/19  PT LONG TERM GOAL #3   Title  Pt will improve A/ROM of the L shoulder to 120 degrees flexion/abduction within 6 weeks in order to decrease risk for immobility.    Baseline  80 degrees L shoulder flexion, 75 degrees L shoulder abduction.    Time  4     Period  Weeks    Status  New    Target Date  05/02/19      PT LONG TERM GOAL #4   Title  Pt will report 50% improvement in pain/heaviness in the LUE within 4 weeks in order to demonstrate an improvementin quality of life.    Baseline  pt reports 3/10 pain at the worst.    Time  4    Period  Weeks    Status  New    Target Date  05/02/19      PT LONG TERM GOAL #5   Title  Pt will improve LLIS to 30% or less within 4 weeks in order to demonstrate objective improvement in functional activitie.    Baseline  45.59%    Time  4    Period  Weeks    Status  New    Target Date  05/02/19            Plan - 04/10/19 1510    Clinical Impression Statement  Pt presents with slight increase in antebrachium and decreased fluid in the L brachium. 1/2 inch gray foam was used at the dorsum of the hand today and 1/4 inch gray foam was used at the anterior antebrachium/and lateral brachium in order to see if this doesn't help decrease fluid in these areas. MLD was performed prior to multi-layer compression wrap. Pt will benefit from continued POC at this time.    Personal Factors and Comorbidities  Comorbidity 2    Comorbidities  Bil radiation due to Bil breast cancer and lumpectomy, 15 lymph node removal on the L    Stability/Clinical Decision Making  Stable/Uncomplicated    Rehab Potential  Good    PT Frequency  3x / week    PT Duration  4 weeks    PT Treatment/Interventions  Therapeutic activities;Therapeutic exercise;Iontophoresis 11m/ml Dexamethasone;Patient/family education;Manual techniques;Neuromuscular re-education    PT Next Visit Plan  Continue CDT including wrapping and MLD and myofascial release. See how foam did, monitor measurements to see if pt is getting reduction.    PT Home Exercise Plan  see pt instructions    Consulted and Agree with Plan of Care  Patient       Patient will benefit from skilled therapeutic intervention in order to improve the following deficits and  impairments:  Decreased range of motion, Pain, Increased edema  Visit Diagnosis: History of ductal carcinoma in situ (DCIS) of breast  Lymphedema, not elsewhere classified  Left shoulder pain, unspecified chronicity  Stiffness of left shoulder, not elsewhere classified     Problem List Patient Active Problem List   Diagnosis Date Noted  . Pes anserine bursitis 03/31/2019  . HTN (hypertension) 02/10/2019  . Hyperglycemia 06/21/2018  . Left carpal tunnel syndrome 02/22/2018  . Rotator cuff arthropathy of left shoulder 09/20/2017  . Cervical radiculitis 04/09/2017  . Left shoulder pain 04/09/2017  . Dyspnea on exertion 05/14/2016  . 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 Lymphedema 01/14/2016  . Left arm swelling 12/25/2015  . SVT (supraventricular tachycardia) ([place]   . Left-sided carotid artery disease ([place] 03/08/2015  . Dizziness 01/24/2015  .  Urinary frequency 01/13/2015  . Dizziness and giddiness 01/09/2015  . Gallstones 02/21/2013  . Breast cancer of upper-inner quadrant of right female breast ([place]) 11/28/2012  . Muscle cramping 09/12/2012  . Right hip pain 09/12/2012  . Lower back pain 09/12/2012  . COPD GOLD I    . Hx of radiation therapy   . Right shoulder pain 09/14/2011  . Preventative health care 07/29/2010  . Skin lesion of left leg 07/29/2010  . Paresthesia 07/29/2010  . GERD 03/28/2010  . CONSTIPATION 03/28/2010  . Osteoarthrosis, 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  . Anxiety state 05/03/2009  . Hyperlipidemia 06/14/2007  . FATIGUE 06/14/2007  . 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 DIVERTICULOSIS, COLON 12/17/2006  . Cough 12/17/2006  . IRRITABLE BOWEL SYNDROME, HX OF 12/17/2006    Ander Purpura, PT 04/10/2019, 5:24 PM  [place], [place], [address] Phone: [phone]   Fax:  [phone]  Name: ADENA SIMA MRN: [id number] Date of Birth:  08-29-1940

## 2019-04-12 ENCOUNTER — Ambulatory Visit: Payer: Medicare Other

## 2019-04-12 ENCOUNTER — Other Ambulatory Visit: Payer: Self-pay

## 2019-04-12 DIAGNOSIS — M25512 Pain in left shoulder: Secondary | ICD-10-CM | POA: Diagnosis not present

## 2019-04-12 DIAGNOSIS — Z86000 Personal history of in-situ neoplasm of breast: Secondary | ICD-10-CM

## 2019-04-12 DIAGNOSIS — M25612 Stiffness of left shoulder, not elsewhere classified: Secondary | ICD-10-CM

## 2019-04-12 DIAGNOSIS — I890 Lymphedema, not elsewhere classified: Secondary | ICD-10-CM

## 2019-04-12 NOTE — Therapy (Signed)
[place], [place], [address] Phone: [phone]   Fax:  [phone]  Physical Therapy Treatment  Patient Details  Name: Stacey [name] MRN: [id number] Date of Birth: [birth year] Referring Provider (PT): Lurline Del MD   Encounter Date: 04/12/2019  PT End of Session - 04/12/19 1450    Visit Number  5    Number of Visits  13    Date for PT Re-Evaluation  05/02/19    PT Start Time  1450    PT Stop Time  1530    PT Time Calculation (min)  40 min    Activity Tolerance  Patient tolerated treatment well    Behavior During Therapy  [facility] for tasks assessed/performed       Past Medical History:  Diagnosis Date  . ANEMIA-NOS   . ANXIETY   . Breast cancer ([place]) 1997 L, 2012 R   s/p chemo/xrt  . COPD    resolved  . DIVERTICULOSIS, COLON 2008  . Dizziness   . GERD   . Hx of radiation therapy 10/19/11 -12/03/11   right breast  . HYPERLIPIDEMIA   . IRRITABLE BOWEL SYNDROME, HX OF   . Left-sided carotid artery disease (HCC)    moderate left ICA stenosis  . OSTEOARTHRITIS, HAND   . PSVT (paroxysmal supraventricular tachycardia) (HCC)    symptomatic on event monitor    Past Surgical History:  Procedure Laterality Date  . ABDOMINAL HYSTERECTOMY    . APPENDECTOMY    . BREAST BIOPSY  11/14/10    r breast: inv, insitu mammary carcinoma w/calcif, er/pr +, her2 -  . BREAST SURGERY     lumpectomy  . CATARACT EXTRACTION     both eyes  . ELECTROPHYSIOLOGIC STUDY N/A 05/10/2015   Procedure: SVT Ablation;  Surgeon: Will Meredith Leeds, MD;  Location: [facility];  Service: Cardiovascular;  Laterality: N/A;  . HERNIA REPAIR    . inguinal herniorrhapy  1984   left  . rectal fissure repair    . s/p benign breast biopsy  2003   right  . s/p left foot surgury  2009  . 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 spiral fx left foot  2008   no surgury  . TMJ ARTHROPLASTY  1989  . [place]- to remove scar tissue growth   . TONSILLECTOMY      There were no vitals filed for this visit.  Subjective Assessment - 04/12/19 1451    Subjective  Pt states that her finger wraps just do not stay on. She reports that she has a spot that is rubbing on the palm of her hand.    Pertinent History  Hx breast cancer with 15 lymph node removal on the L and Bil lumpectomy. Lumpectomy on the L in 1997and the R in 2012. Radiation Bil    Patient Stated Goals  I want to take care of the swelling in my arm and learn what to do about it.    Currently in Pain?  No/denies    Multiple Pain Sites  No                  [facility]patient [facility]  Lymphedema Life Impact Scale Total Score  45.59 %           [facility] Adult PT Treatment/Exercise - 04/12/19 0001      Manual Therapy  Manual Therapy  Manual Lymphatic Drainage (MLD);Compression Bandaging    Manual Lymphatic Drainage (MLD)  In supine: short neck, swimming in the terminus, Bil axillary nodes, L inguinal nodes, 5 diaphragmatic breathes, L axillo-inguinal anastomosis, L shoulder, Lateral brachium, medial to lateral brachium, lateral brachium, antecubital fossa, all surfaces of the antebrachium, dorsum of the hand, reworked all surfaces then deep abdominals.     Compression Bandaging  Pt was wrapped using tricofix as a base layer 3 artiflex for shaping, 1/2 inch gray foam at the dorsum of the hand and 1/4 inch gray foam at the anteiror antebrachium to the antecubital fossa and on the lateral brachium , extra time taken to get all of the air out of the foam. Very minimal pull due to pt continues with numbness in her arm. No finger wraps were added this session due to pt has difficulty with them staying in place.             PT Education - 04/12/19 1528    Education Details  Re-iterated reasons to remove the wrap as well as emphasized removing the wrap if she notices any  increased edema in the fingers. She will continue with her exercises at home.    Person(s) Educated  Patient    Methods  Explanation    Comprehension  Verbalized understanding       PT Short Term Goals - 03/28/19 1714      PT SHORT TERM GOAL #1   Title  Pt will be independent with HEP and MLD within 2 weeks in order to decrease improve autonomy of care.    Baseline  Pt does not perform exercise or MLD.    Time  2    Period  Weeks    Status  New    Target Date  04/18/19        PT Long Term Goals - 03/28/19 1715      PT LONG TERM GOAL #1   Title  Patient will have reduction of LUE limb girth by 3 cm or greater in the proximal and distal brachium/antebrachium within 4 weeks in order to demonstrate decrease edema.    Baseline  see measurements.    Time  4    Period  Weeks    Status  New    Target Date  05/02/19      PT LONG TERM GOAL #2   Title  Patient to be properly fitted with appropriate compression garment to wear on daily basis for home management.    Baseline  pt currently has 20-30 mmHG arm garment, no glove    Time  4    Period  Weeks    Status  New    Target Date  05/02/19      PT LONG TERM GOAL #3   Title  Pt will improve A/ROM of the L shoulder to 120 degrees flexion/abduction within 6 weeks in order to decrease risk for immobility.    Baseline  80 degrees L shoulder flexion, 75 degrees L shoulder abduction.    Time  4    Period  Weeks    Status  New    Target Date  05/02/19      PT LONG TERM GOAL #4   Title  Pt will report 50% improvement in pain/heaviness in the LUE within 4 weeks in order to demonstrate an improvementin quality of life.    Baseline  pt reports 3/10 pain at the worst.    Time  4  Period  Weeks    Status  New    Target Date  05/02/19      PT LONG TERM GOAL #5   Title  Pt will improve LLIS to 30% or less within 4 weeks in order to demonstrate objective improvement in functional activitie.    Baseline  45.59%    Time  4    Period   Weeks    Status  New    Target Date  05/02/19            Plan - 04/12/19 1450    Clinical Impression Statement  Pt has improvement in the antebrachium edema this session after added 1/4 inch gray foam last session. Continued with foam in the same area this session. Finger wraps were not used due to pt states that she has been cutting them off and her fingers have no significant increase in edema. MLD was performed prior to applying multi-layer compression wrap. Pt will benefit from continued POC at this time.    Personal Factors and Comorbidities  Comorbidity 2    Comorbidities  Bil radiation due to Bil breast cancer and lumpectomy, 15 lymph node removal on the L    Stability/Clinical Decision Making  Stable/Uncomplicated    Rehab Potential  Good    PT Frequency  3x / week    PT Duration  4 weeks    PT Treatment/Interventions  Therapeutic activities;Therapeutic exercise;Iontophoresis 37m/ml Dexamethasone;Patient/family education;Manual techniques;Neuromuscular re-education    PT Next Visit Plan  Continue CDT including wrapping and MLD and myofascial release. See how foam did, monitor measurements to see if pt is getting reduction.    PT Home Exercise Plan  see pt instructions    Consulted and Agree with Plan of Care  Patient       Patient will benefit from skilled therapeutic intervention in order to improve the following deficits and impairments:  Decreased range of motion, Pain, Increased edema  Visit Diagnosis: History of ductal carcinoma in situ (DCIS) of breast  Lymphedema, not elsewhere classified  Left shoulder pain, unspecified chronicity  Stiffness of left shoulder, not elsewhere classified     Problem List Patient Active Problem List   Diagnosis Date Noted  . Pes anserine bursitis 03/31/2019  . HTN (hypertension) 02/10/2019  . Hyperglycemia 06/21/2018  . Left carpal tunnel syndrome 02/22/2018  . Rotator cuff arthropathy of left shoulder 09/20/2017  . Cervical  radiculitis 04/09/2017  . Left shoulder pain 04/09/2017  . Dyspnea on exertion 05/14/2016  . 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 Lymphedema 01/14/2016  . Left arm swelling 12/25/2015  . [place]) [place]   . [place] disease [place] 03/08/2015  . Dizziness 01/24/2015  . Urinary frequency 01/13/2015  . Dizziness and giddiness 01/09/2015  . Gallstones 02/21/2013  . Breast [place] of upper[place]inner quadrant of [place] 11/28/2012  . Muscle cramping 09/12/2012  . Right hip pain 09/12/2012  . Lower back pain 09/12/2012  . COPD GOLD I    . Hx of radiation therapy   . Right shoulder pain 09/14/2011  . Preventative health care 07/29/2010  . Skin lesion of left leg 07/29/2010  . Paresthesia 07/29/2010  . GERD 03/28/2010  . CONSTIPATION 03/28/2010  . Osteoarthrosis, 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  . Anxiety state 05/03/2009  . Hyperlipidemia 06/14/2007  . FATIGUE 06/14/2007  . 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 DIVERTICULOSIS, COLON 12/17/2006  . Cough 12/17/2006  . IRRITABLE BOWEL SYNDROME, HX  OF 12/17/2006    Ander Purpura, PT 04/12/2019, 3:31 PM  [place], [place], [address] Phone: [phone]   Fax:  [phone]  Name: Stacey [name] MRN: [id number] Date of Birth: 21-Apr-1940

## 2019-04-14 ENCOUNTER — Other Ambulatory Visit: Payer: Self-pay

## 2019-04-14 ENCOUNTER — Ambulatory Visit: Payer: Medicare Other

## 2019-04-14 DIAGNOSIS — Z86000 Personal history of in-situ neoplasm of breast: Secondary | ICD-10-CM | POA: Diagnosis not present

## 2019-04-14 DIAGNOSIS — I890 Lymphedema, not elsewhere classified: Secondary | ICD-10-CM | POA: Diagnosis not present

## 2019-04-14 DIAGNOSIS — M25512 Pain in left shoulder: Secondary | ICD-10-CM | POA: Diagnosis not present

## 2019-04-14 DIAGNOSIS — M25612 Stiffness of left shoulder, not elsewhere classified: Secondary | ICD-10-CM | POA: Diagnosis not present

## 2019-04-14 NOTE — Therapy (Signed)
[place], [place], [address] Phone: [phone]   Fax:  [phone]  Physical Therapy Treatment  Patient Details  Name: Stacey [name] MRN: [id number] Date of Birth: January 21, 1941 Referring Provider (PT): Lurline Del MD   Encounter Date: 04/14/2019  PT End of Session - 04/14/19 1108    Visit Number  6    Number of Visits  13    Date for PT Re-Evaluation  05/02/19    PT Start Time  1100    PT Stop Time  1153    PT Time Calculation (min)  53 min    Activity Tolerance  Patient tolerated treatment well    Behavior During Therapy  [facility] for tasks assessed/performed       Past Medical History:  Diagnosis Date  . ANEMIA-NOS   . ANXIETY   . Breast cancer ([place]) 1997 L, 2012 R   s/p chemo/xrt  . COPD    resolved  . DIVERTICULOSIS, COLON 2008  . Dizziness   . GERD   . Hx of radiation therapy 10/19/11 -12/03/11   right breast  . HYPERLIPIDEMIA   . IRRITABLE BOWEL SYNDROME, HX OF   . Left-sided carotid artery disease (HCC)    moderate left ICA stenosis  . OSTEOARTHRITIS, HAND   . PSVT (paroxysmal supraventricular tachycardia) (HCC)    symptomatic on event monitor    Past Surgical History:  Procedure Laterality Date  . ABDOMINAL HYSTERECTOMY    . APPENDECTOMY    . BREAST BIOPSY  11/14/10    r breast: inv, insitu mammary carcinoma w/calcif, er/pr +, her2 -  . BREAST SURGERY     lumpectomy  . CATARACT EXTRACTION     both eyes  . ELECTROPHYSIOLOGIC STUDY N/A 05/10/2015   Procedure: SVT Ablation;  Surgeon: Will Meredith Leeds, MD;  Location: [facility];  Service: Cardiovascular;  Laterality: N/A;  . HERNIA REPAIR    . inguinal herniorrhapy  1984   left  . rectal fissure repair    . s/p benign breast biopsy  2003   right  . s/p left foot surgury  2009  . 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 spiral fx left foot  2008   no surgury  . TMJ ARTHROPLASTY  1989  . [place]- to remove scar tissue growth   . TONSILLECTOMY      There were no vitals filed for this visit.  Subjective Assessment - 04/14/19 1108    Subjective  Pt states she is still having some trouble with pain at her fingers. The spot on her palm is better today.    Pertinent History  Hx breast cancer with 15 lymph node removal on the L and Bil lumpectomy. Lumpectomy on the L in 1997and the R in 2012. Radiation Bil    Patient Stated Goals  I want to take care of the swelling in my arm and learn what to do about it.    Currently in Pain?  No/denies    Multiple Pain Sites  No            LYMPHEDEMA/ONCOLOGY QUESTIONNAIRE - 04/14/19 1103      Left Upper Extremity Lymphedema   15 cm Proximal to Olecranon Process  33.5 cm    10 cm Proximal to Olecranon Process  34 cm    Olecranon Process  28.5 cm    15 cm Proximal to Ulnar Styloid  Process  28.5 cm    10 cm Proximal to Ulnar Styloid Process  25.7 cm    Just Proximal to Ulnar Styloid Process  18.8 cm    Across Hand at [employer]  17.5 cm    At [place] of 2nd Digit  6.3 cm           Outpatient Re[facility] from [facility]28/[facility]  Lymphedema Life Impact Scale Total Score  45.59 %           [facility] Adult PT Treatment/Exercise - 04/14/19 0001      Manual Therapy   Manual Therapy  Manual Lymphatic Drainage (MLD);Compression Bandaging    Manual Lymphatic Drainage (MLD)  In supine: short neck, swimming in the terminus, Bil axillary nodes, L inguinal nodes, 5 diaphragmatic breathes, L axillo-inguinal anastomosis, superior breast toward the L axilla and inferior breast toward the anastomosis then: L shoulder, Lateral brachium, medial to lateral brachium, lateral brachium, antecubital fossa, all surfaces of the antebrachium, dorsum of the hand, reworked all surfaces then deep abdominals.     Compression Bandaging  Pt was wrapped using tricofix as a base layer 3 artiflex for shaping, 1/2 inch gray foam  at the dorsum of the hand and 1/4 inch gray foam at the anteiror antebrachium to the antecubital fossa and on the lateral brachium , extra time taken to get all of the air out of the foam. Very minimal pull due to pt continues with numbness in her arm. No finger wraps were added this session due to pt has difficulty with them staying in place.             PT Education - 04/14/19 1109    Education Details  Pt will continue to perform her exercises at home. She is able to state reasons to remove the wrap including pain, throbbing, increased swelling.    Person(s) Educated  Patient    Methods  Explanation    Comprehension  Verbalized understanding       PT Short Term Goals - 03/28/19 1714      PT SHORT TERM GOAL #1   Title  Pt will be independent with HEP and MLD within 2 weeks in order to decrease improve autonomy of care.    Baseline  Pt does not perform exercise or MLD.    Time  2    Period  Weeks    Status  New    Target Date  04/18/19        PT Long Term Goals - 03/28/19 1715      PT LONG TERM GOAL #1   Title  Patient will have reduction of LUE limb girth by 3 cm or greater in the proximal and distal brachium/antebrachium within 4 weeks in order to demonstrate decrease edema.    Baseline  see measurements.    Time  4    Period  Weeks    Status  New    Target Date  05/02/19      PT LONG TERM GOAL #2   Title  Patient to be properly fitted with appropriate compression garment to wear on daily basis for home management.    Baseline  pt currently has 20-30 mmHG arm garment, no glove    Time  4    Period  Weeks    Status  New    Target Date  05/02/19      PT LONG TERM GOAL #3   Title  Pt will improve  A/ROM of the L shoulder to 120 degrees flexion/abduction within 6 weeks in order to decrease risk for immobility.    Baseline  80 degrees L shoulder flexion, 75 degrees L shoulder abduction.    Time  4    Period  Weeks    Status  New    Target Date  05/02/19      PT  LONG TERM GOAL #4   Title  Pt will report 50% improvement in pain/heaviness in the LUE within 4 weeks in order to demonstrate an improvementin quality of life.    Baseline  pt reports 3/10 pain at the worst.    Time  4    Period  Weeks    Status  New    Target Date  05/02/19      PT LONG TERM GOAL #5   Title  Pt will improve LLIS to 30% or less within 4 weeks in order to demonstrate objective improvement in functional activitie.    Baseline  45.59%    Time  4    Period  Weeks    Status  New    Target Date  05/02/19            Plan - 04/14/19 1107    Clinical Impression Statement  Pt presents to physical therapy with decrease circumferential measurementsin her LUE with 2 cm in the brachium and ~1 cm in the antebrachium. She has significant softening in the forewarm and posterior brachium. She is presenting with increased edema in the L breast; discussed compression bra with pt due to this increased edema. Currently wearing the wrap she is unable to clasp bras and is not wearing one. MLD was performed for the LUE and L breast this session with good improvement in the L breast at the end of the session. Layered compression wrap was applied to the LUE 6 cm was slightly folded at the hand to pt reporting rubbing/difficulty at the index finger. Pt will benefit grom continued POC at this time.    Personal Factors and Comorbidities  Comorbidity 2    Comorbidities  Bil radiation due to Bil breast cancer and lumpectomy, 15 lymph node removal on the L    Rehab Potential  Good    PT Frequency  3x / week    PT Duration  4 weeks    PT Treatment/Interventions  Therapeutic activities;Therapeutic exercise;Iontophoresis 77m/ml Dexamethasone;Patient/family education;Manual techniques;Neuromuscular re-education    PT Next Visit Plan  Continue CDT including wrapping and MLD and myofascial release. See how foam did, monitor measurements to see if pt is getting reduction.    PT Home Exercise Plan  see pt  instructions    Recommended Other Services  appropriate garment possibly night garment vs. heavier compression and pump plus compression bra    Consulted and Agree with Plan of Care  Patient       Patient will benefit from skilled therapeutic intervention in order to improve the following deficits and impairments:  Decreased range of motion, Pain, Increased edema  Visit Diagnosis: History of ductal carcinoma in situ (DCIS) of breast  Lymphedema, not elsewhere classified  Left shoulder pain, unspecified chronicity  Stiffness of left shoulder, not elsewhere classified     Problem List Patient Active Problem List   Diagnosis Date Noted  . Pes anserine bursitis 03/31/2019  . HTN (hypertension) 02/10/2019  . Hyperglycemia 06/21/2018  . Left carpal tunnel syndrome 02/22/2018  . Rotator cuff arthropathy of left shoulder 09/20/2017  . Cervical radiculitis 04/09/2017  .  Left shoulder pain 04/09/2017  . Dyspnea on exertion 05/14/2016  . 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 Lymphedema 01/14/2016  . Left arm swelling 12/25/2015  . SVT (supraventricular tachycardia) ([place])   . Left-sided carotid artery disease ([place]) 03/08/2015  . Dizziness 01/24/2015  . Urinary frequency 01/13/2015  . Dizziness and giddiness 01/09/2015  . Gallstones 02/21/2013  . Breast cancer of upper-inner quadrant of right female breast ([place]) 11/28/2012  . Muscle cramping 09/12/2012  . Right hip pain 09/12/2012  . Lower back pain 09/12/2012  . COPD GOLD I    . Hx of radiation therapy   . Right shoulder pain 09/14/2011  . Preventative health care 07/29/2010  . Skin lesion of left leg 07/29/2010  . Paresthesia 07/29/2010  . GERD 03/28/2010  . CONSTIPATION 03/28/2010  . Osteoarthrosis, 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  . Anxiety state 05/03/2009  . Hyperlipidemia 06/14/2007  . FATIGUE 06/14/2007  . 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 DIVERTICULOSIS, COLON 12/17/2006  . Cough 12/17/2006   . IRRITABLE BOWEL SYNDROME, HX OF 12/17/2006    Ander Purpura, PT 04/14/2019, 11:54 AM  [place], [place], [address] Phone: [phone]   Fax:  [phone]  Name: Stacey [name] MRN: [id number] Date of Birth: 1940-12-12

## 2019-04-17 ENCOUNTER — Other Ambulatory Visit: Payer: Self-pay

## 2019-04-17 ENCOUNTER — Ambulatory Visit: Payer: Medicare Other

## 2019-04-17 DIAGNOSIS — Z86000 Personal history of in-situ neoplasm of breast: Secondary | ICD-10-CM

## 2019-04-17 DIAGNOSIS — I890 Lymphedema, not elsewhere classified: Secondary | ICD-10-CM

## 2019-04-17 DIAGNOSIS — M25512 Pain in left shoulder: Secondary | ICD-10-CM

## 2019-04-17 DIAGNOSIS — M25612 Stiffness of left shoulder, not elsewhere classified: Secondary | ICD-10-CM

## 2019-04-17 NOTE — Therapy (Signed)
[place], [place], [address] Phone: [phone]   Fax:  [phone]  Physical Therapy Treatment  Patient Details  Name: Stacey [name] MRN: [id number] Date of Birth: Jul 07, 1940 Referring Provider (PT): Lurline Del MD   Encounter Date: 04/17/2019  PT End of Session - 04/17/19 1510    Visit Number  7    Number of Visits  13    Date for PT Re-Evaluation  05/02/19    PT Start Time  1406    PT Stop Time  1508    PT Time Calculation (min)  62 min    Activity Tolerance  Patient tolerated treatment well    Behavior During Therapy  [facility] for tasks assessed/performed       Past Medical History:  Diagnosis Date  . ANEMIA-NOS   . ANXIETY   . Breast cancer ([place]) 1997 L, 2012 R   s/p chemo/xrt  . COPD    resolved  . DIVERTICULOSIS, COLON 2008  . Dizziness   . GERD   . Hx of radiation therapy 10/19/11 -12/03/11   right breast  . HYPERLIPIDEMIA   . IRRITABLE BOWEL SYNDROME, HX OF   . Left-sided carotid artery disease (HCC)    moderate left ICA stenosis  . OSTEOARTHRITIS, HAND   . PSVT (paroxysmal supraventricular tachycardia) (HCC)    symptomatic on event monitor    Past Surgical History:  Procedure Laterality Date  . ABDOMINAL HYSTERECTOMY    . APPENDECTOMY    . BREAST BIOPSY  11/14/10    r breast: inv, insitu mammary carcinoma w/calcif, er/pr +, her2 -  . BREAST SURGERY     lumpectomy  . CATARACT EXTRACTION     both eyes  . ELECTROPHYSIOLOGIC STUDY N/A 05/10/2015   Procedure: SVT Ablation;  Surgeon: Will Meredith Leeds, MD;  Location: [facility];  Service: Cardiovascular;  Laterality: N/A;  . HERNIA REPAIR    . inguinal herniorrhapy  1984   left  . rectal fissure repair    . s/p benign breast biopsy  2003   right  . s/p left foot surgury  2009  . 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 spiral fx left foot  2008   no surgury  . TMJ ARTHROPLASTY  1989  . Cross- to remove scar tissue growth   . TONSILLECTOMY      There were no vitals filed for this visit.  Subjective Assessment - 04/17/19 1410    Subjective  I was started on a BP med in January and it has really made me fatigue and drowsy feeling since then so I'm hoping my doctor will take me off it. My Lt arm is slowly coming down and doing well with the bandaging.    Pertinent History  Hx breast cancer with 15 lymph node removal on the L and Bil lumpectomy. Lumpectomy on the L in 1997and the R in 2012. Radiation Bil    Patient Stated Goals  I want to take care of the swelling in my arm and learn what to do about it.    Currently in Pain?  No/denies                  Outpatient Rehab from [facility]/28/2019 [facility]  Lymphedema Life Impact Scale Total Score  45.59 %           [facility] Adult PT Treatment/Exercise -  04/17/19 0001      Manual Therapy   Manual Therapy  Manual Lymphatic Drainage (MLD);Compression Bandaging    Manual Lymphatic Drainage (MLD)  In supine: short neck, bil shoulder collectors, bil upper trapezius, 5 diaphragmatic breaths, Rt axillary nodes, Lt inguinal nodes, L axillo-inguinal and anterior inter-axillary anastomosis, superior breast toward the inter-axillary and inferior breast toward the axillo-inguinal anastomosis then: L shoulder, Lateral brachium, medial to lateral brachium, lateral brachium, antecubital fossa, all surfaces of the antebrachium, dorsum of the hand, then retraced all steps finshing with lymph nodes.     Compression Bandaging  Thick massage cream, thick stockinette, Artiflex x1 with 1/2 inch gray foam at the dorsum of the hand and 1/4 inch gray foam at the anterior antebrachium to the antecubital fossa and on the lateral brachium , extra time taken to get all of the air out of the foam. Very minimal pull due to pt continues with numbness in her arm. No finger wraps were added this session due to pt has  difficulty with them staying in place. 1-6, 1-8c(herrnig bone and "X" at elbow), 1-10 and 1-12 cm short stretch compression bandages from hand to axilla. Educated pt she can take 12 off if later begins to have increased tingling or discomfort.                PT Short Term Goals - 03/28/19 1714      PT SHORT TERM GOAL #1   Title  Pt will be independent with HEP and MLD within 2 weeks in order to decrease improve autonomy of care.    Baseline  Pt does not perform exercise or MLD.    Time  2    Period  Weeks    Status  New    Target Date  04/18/19        PT Long Term Goals - 03/28/19 1715      PT LONG TERM GOAL #1   Title  Patient will have reduction of LUE limb girth by 3 cm or greater in the proximal and distal brachium/antebrachium within 4 weeks in order to demonstrate decrease edema.    Baseline  see measurements.    Time  4    Period  Weeks    Status  New    Target Date  05/02/19      PT LONG TERM GOAL #2   Title  Patient to be properly fitted with appropriate compression garment to wear on daily basis for home management.    Baseline  pt currently has 20-30 mmHG arm garment, no glove    Time  4    Period  Weeks    Status  New    Target Date  05/02/19      PT LONG TERM GOAL #3   Title  Pt will improve A/ROM of the L shoulder to 120 degrees flexion/abduction within 6 weeks in order to decrease risk for immobility.    Baseline  80 degrees L shoulder flexion, 75 degrees L shoulder abduction.    Time  4    Period  Weeks    Status  New    Target Date  05/02/19      PT LONG TERM GOAL #4   Title  Pt will report 50% improvement in pain/heaviness in the LUE within 4 weeks in order to demonstrate an improvementin quality of life.    Baseline  pt reports 3/10 pain at the worst.    Time  4    Period  Weeks    Status  New    Target Date  05/02/19      PT LONG TERM GOAL #5   Title  Pt will improve LLIS to 30% or less within 4 weeks in order to demonstrate objective  improvement in functional activitie.    Baseline  45.59%    Time  4    Period  Weeks    Status  New    Target Date  05/02/19            Plan - 04/17/19 1512    Clinical Impression Statement  Continued with complete decongestive therapy today to Lt UE, including breast in manual lymph drainage sequence. Also continued with not wrapping fingers though educated pt that this greatly increases risk of lymphedema into her fingers. She verbalized understanding and requested wanting to continue not wrapping fingers unless they begin to swell. Good softening noted in breast by end of session and foreram continued to present with being softened as mentioned at last session and pt reports noticing this as well. Applied 4 bandages to UE and she verbalized it may be too tight later so instructed her that it would be okay to remove last applied bandage and leave rest on and she agreed. Also further educated pt about a compression bra for newer breast lymphedema and she is interested in this.    Personal Factors and Comorbidities  Comorbidity 2    Comorbidities  Bil radiation due to Bil breast cancer and lumpectomy, 15 lymph node removal on the L    Stability/Clinical Decision Making  Stable/Uncomplicated    Rehab Potential  Good    PT Frequency  3x / week    PT Duration  4 weeks    PT Treatment/Interventions  Therapeutic activities;Therapeutic exercise;Iontophoresis 39m/ml Dexamethasone;Patient/family education;Manual techniques;Neuromuscular re-education    PT Next Visit Plan  [facility] Continue CDT including wrapping and MLD and myofascial release. See how foam did, monitor measurements to see if pt is getting reduction.    Consulted and Agree with Plan of Care  Patient       Patient will benefit from skilled therapeutic intervention in order to improve the following deficits and impairments:  Decreased range of motion, Pain, Increased  edema  Visit Diagnosis: History of ductal carcinoma in situ (DCIS) of breast  Lymphedema, not elsewhere classified  Left shoulder pain, unspecified chronicity  Stiffness of left shoulder, not elsewhere classified     Problem List Patient Active Problem List   Diagnosis Date Noted  . Pes anserine bursitis 03/31/2019  . HTN (hypertension) 02/10/2019  . Hyperglycemia 06/21/2018  . Left carpal tunnel syndrome 02/22/2018  . Rotator cuff arthropathy of left shoulder 09/20/2017  . Cervical radiculitis 04/09/2017  . Left shoulder pain 04/09/2017  . Dyspnea on exertion 05/14/2016  . 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 Lymphedema 01/14/2016  . Left arm swelling 12/25/2015  . SVT (supraventricular tachycardia) ([facility]   . Left-sided carotid artery disease ([facility] 03/08/2015  . Dizziness 01/24/2015  . Urinary frequency 01/13/2015  . Dizziness and giddiness 01/09/2015  . Gallstones 02/21/2013  . Breast cancer of upper-inner quadrant of right female breast ([facility] 11/28/2012  . Muscle cramping 09/12/2012  . Right hip pain 09/12/2012  . Lower back pain 09/12/2012  . COPD GOLD I    . Hx of radiation therapy   . Right shoulder pain 09/14/2011  . Preventative health care  07/29/2010  . Skin lesion of left leg 07/29/2010  . Paresthesia 07/29/2010  . GERD 03/28/2010  . CONSTIPATION 03/28/2010  . Osteoarthrosis, 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  . Anxiety state 05/03/2009  . Hyperlipidemia 06/14/2007  . FATIGUE 06/14/2007  . 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 DIVERTICULOSIS, COLON 12/17/2006  . Cough 12/17/2006  . IRRITABLE BOWEL SYNDROME, HX OF 12/17/2006    Otelia Limes, PTA 04/17/2019, 4:03 PM  [place], [place], [address] Phone: [phone]   Fax:  [phone]  Name: RAYLA PEMBER MRN: [id number] Date of Birth: February 09, 1940

## 2019-04-19 ENCOUNTER — Ambulatory Visit (INDEPENDENT_AMBULATORY_CARE_PROVIDER_SITE_OTHER): Payer: Medicare Other | Admitting: Internal Medicine

## 2019-04-19 ENCOUNTER — Ambulatory Visit: Payer: Medicare Other

## 2019-04-19 ENCOUNTER — Encounter: Payer: Self-pay | Admitting: Internal Medicine

## 2019-04-19 ENCOUNTER — Other Ambulatory Visit: Payer: Self-pay

## 2019-04-19 VITALS — BP 150/82 | HR 94 | Temp 98.5°F | Ht 63.5 in | Wt 174.4 lb

## 2019-04-19 DIAGNOSIS — E781 Pure hyperglyceridemia: Secondary | ICD-10-CM

## 2019-04-19 DIAGNOSIS — M25612 Stiffness of left shoulder, not elsewhere classified: Secondary | ICD-10-CM

## 2019-04-19 DIAGNOSIS — Z86000 Personal history of in-situ neoplasm of breast: Secondary | ICD-10-CM

## 2019-04-19 DIAGNOSIS — M25512 Pain in left shoulder: Secondary | ICD-10-CM

## 2019-04-19 DIAGNOSIS — I10 Essential (primary) hypertension: Secondary | ICD-10-CM

## 2019-04-19 DIAGNOSIS — R739 Hyperglycemia, unspecified: Secondary | ICD-10-CM

## 2019-04-19 DIAGNOSIS — I890 Lymphedema, not elsewhere classified: Secondary | ICD-10-CM | POA: Diagnosis not present

## 2019-04-19 DIAGNOSIS — J449 Chronic obstructive pulmonary disease, unspecified: Secondary | ICD-10-CM

## 2019-04-19 MED ORDER — TELMISARTAN 40 MG PO TABS: 40.0000 mg | ORAL_TABLET | Freq: Every day | ORAL | 3 refills | Status: DC

## 2019-04-19 MED ORDER — OMEPRAZOLE 20 MG PO CPDR: DELAYED_RELEASE_CAPSULE | ORAL | 0 refills | Status: DC

## 2019-04-19 NOTE — Assessment & Plan Note (Signed)
stable overall by history and exam, recent data reviewed with pt, and pt to continue medical treatment as before,  to f/u any worsening symptoms or concerns  

## 2019-04-19 NOTE — Assessment & Plan Note (Addendum)
Uncontrolled, for change to micardis 40 qd  I spent 31 minutes in preparing to see the patient by review of recent labs, imaging and procedures, obtaining and reviewing separately obtained history, communicating with the patient and family or caregiver, ordering medications, tests or procedures, and documenting clinical information in the EHR including the differential Dx, treatment, and any further evaluation and other management of htn, hyperglycemia, hld, copd

## 2019-04-19 NOTE — Therapy (Signed)
[place], [place], [address] Phone: [phone]   Fax:  [phone]  Physical Therapy Treatment  Patient Details  Name: LEEN TWOREK MRN: [id number] Date of Birth: [birth year] Referring Provider (PT): Lurline Del MD   Encounter Date: 04/19/2019  PT End of Session - 04/19/19 1310    Visit Number  8    Number of Visits  13    Date for PT Re-Evaluation  05/02/19    PT Start Time  1300    PT Stop Time  1353    PT Time Calculation (min)  53 min    Activity Tolerance  Patient tolerated treatment well    Behavior During Therapy  [facility] for tasks assessed/performed       Past Medical History:  Diagnosis Date  . ANEMIA-NOS   . ANXIETY   . Breast cancer ([place]) 1997 L, 2012 R   s/p chemo/xrt  . COPD    resolved  . DIVERTICULOSIS, COLON 2008  . Dizziness   . GERD   . Hx of radiation therapy 10/19/11 -12/03/11   right breast  . HYPERLIPIDEMIA   . IRRITABLE BOWEL SYNDROME, HX OF   . Left-sided carotid artery disease (HCC)    moderate left ICA stenosis  . OSTEOARTHRITIS, HAND   . PSVT (paroxysmal supraventricular tachycardia) (HCC)    symptomatic on event monitor    Past Surgical History:  Procedure Laterality Date  . ABDOMINAL HYSTERECTOMY    . APPENDECTOMY    . BREAST BIOPSY  11/14/10    r breast: inv, insitu mammary carcinoma w/calcif, er/pr +, her2 -  . BREAST SURGERY     lumpectomy  . CATARACT EXTRACTION     both eyes  . ELECTROPHYSIOLOGIC STUDY N/A 05/10/2015   Procedure: SVT Ablation;  Surgeon: Will Meredith Leeds, MD;  Location: [facility];  Service: Cardiovascular;  Laterality: N/A;  . HERNIA REPAIR    . inguinal herniorrhapy  1984   left  . rectal fissure repair    . s/p benign breast biopsy  2003   right  . s/p left foot surgury  2009  . 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 spiral fx left foot  2008   no surgury  . TMJ ARTHROPLASTY  1989  . [place]- to remove scar tissue growth   . TONSILLECTOMY      There were no vitals filed for this visit.  Subjective Assessment - 04/19/19 1310    Subjective  Pt states that she took her wrap off a little earlier than usual.    Pertinent History  Hx breast cancer with 15 lymph node removal on the L and Bil lumpectomy. Lumpectomy on the L in 1997and the R in 2012. Radiation Bil    Patient Stated Goals  I want to take care of the swelling in my arm and learn what to do about it.    Currently in Pain?  No/denies    Multiple Pain Sites  No                  Outpatient Re[facility]/28/[facility]  Lymphedema Life Impact Scale Total Score  45.59 %           [facility] Adult PT Treatment/Exercise - 04/19/19 0001      Manual Therapy   Manual Therapy  Manual Lymphatic Drainage (MLD);Compression Bandaging;Edema management  Edema Management  pt was measured for compression cami size Large on [place] and assisted with purchasing.     Manual Lymphatic Drainage (MLD)  In supine: short neck, bil shoulder collectors, bil upper trapezius, 5 diaphragmatic breaths, Rt axillary nodes, Lt inguinal nodes, L axillo-inguinal and anterior inter-axillary anastomosis, superior breast toward the inter-axillary and inferior breast toward the axillo-inguinal anastomosis then: L shoulder, Lateral brachium, medial to lateral brachium, lateral brachium, antecubital fossa, all surfaces of the antebrachium, dorsum of the hand, then retraced all steps finshing with lymph nodes.     Compression Bandaging  Thick massage cream, thick stockinette, Artiflex x1 with 1/2 inch gray foam at the dorsum of the hand, the anterior antebrachium to the antecubital fossa and on the lateral brachium , extra time taken to get all of the air out of the foam. Very minimal pull due to pt continues with numbness in her arm. No finger wraps were added this session due to pt has difficulty with them  staying in place. 1-6, 1-8c(herrnig bone and "X" at elbow), 2-10 and 1-12 cm short stretch compression bandages from hand to axilla.              PT Education - 04/19/19 1424    Education Details  Pt will continue to perform her exercises at home. Pt understand when and why to remove her wrap. Pt was educated on the importance of compression over the L breast to help fluid get out of the LUE. Pt was assisted with purchasing a compression tank top.    Person(s) Educated  Patient    Methods  Explanation    Comprehension  Verbalized understanding       PT Short Term Goals - 03/28/19 1714      PT SHORT TERM GOAL #1   Title  Pt will be independent with HEP and MLD within 2 weeks in order to decrease improve autonomy of care.    Baseline  Pt does not perform exercise or MLD.    Time  2    Period  Weeks    Status  New    Target Date  04/18/19        PT Long Term Goals - 03/28/19 1715      PT LONG TERM GOAL #1   Title  Patient will have reduction of LUE limb girth by 3 cm or greater in the proximal and distal brachium/antebrachium within 4 weeks in order to demonstrate decrease edema.    Baseline  see measurements.    Time  4    Period  Weeks    Status  New    Target Date  05/02/19      PT LONG TERM GOAL #2   Title  Patient to be properly fitted with appropriate compression garment to wear on daily basis for home management.    Baseline  pt currently has 20-30 mmHG arm garment, no glove    Time  4    Period  Weeks    Status  New    Target Date  05/02/19      PT LONG TERM GOAL #3   Title  Pt will improve A/ROM of the L shoulder to 120 degrees flexion/abduction within 6 weeks in order to decrease risk for immobility.    Baseline  80 degrees L shoulder flexion, 75 degrees L shoulder abduction.    Time  4    Period  Weeks    Status  New    Target Date  05/02/19      PT LONG TERM GOAL #4   Title  Pt will report 50% improvement in pain/heaviness in the LUE within 4 weeks  in order to demonstrate an improvementin quality of life.    Baseline  pt reports 3/10 pain at the worst.    Time  4    Period  Weeks    Status  New    Target Date  05/02/19      PT LONG TERM GOAL #5   Title  Pt will improve LLIS to 30% or less within 4 weeks in order to demonstrate objective improvement in functional activitie.    Baseline  45.59%    Time  4    Period  Weeks    Status  New    Target Date  05/02/19            Plan - 04/19/19 1309    Clinical Impression Statement  Pt presents with increased edema visually in the L arm and increased edema in her L breast/anterior upper quadrant area. Discussed the importance of compression over the L breast to help decongest the LUE. Pt was assisted on her request with purchasing a compression tank top. She was measured for a size L on the [place] site. MLD was performed for the LUE with extra time spent at the L breast/upper quadrant today. Pt LUE was wrapped using 1/2 inch gray foam this session to see if this doesn't help more than the 1/4 inch gray foam with the fibrotic areas in the forearm and posterior brachium. Pt will benefit from continued POC at this time.    Personal Factors and Comorbidities  Comorbidity 2    Comorbidities  Bil radiation due to Bil breast cancer and lumpectomy, 15 lymph node removal on the L    Rehab Potential  Good    PT Frequency  3x / week    PT Duration  4 weeks    PT Treatment/Interventions  Therapeutic activities;Therapeutic exercise;Iontophoresis 23m/ml Dexamethasone;Patient/family education;Manual techniques;Neuromuscular re-education    PT Next Visit Plan  Issued script for compression [facility] Continue CDT including wrapping and MLD and myofascial release. See how foam did, monitor measurements to see if pt is getting reduction.    PT Home Exercise Plan  see pt instructions    Consulted and Agree with Plan of Care  Patient       Patient will benefit from skilled  therapeutic intervention in order to improve the following deficits and impairments:  Decreased range of motion, Pain, Increased edema  Visit Diagnosis: History of ductal carcinoma in situ (DCIS) of breast  Lymphedema, not elsewhere classified  Left shoulder pain, unspecified chronicity  Stiffness of left shoulder, not elsewhere classified     Problem List Patient Active Problem List   Diagnosis Date Noted  . Pes anserine bursitis 03/31/2019  . HTN (hypertension) 02/10/2019  . Hyperglycemia 06/21/2018  . Left carpal tunnel syndrome 02/22/2018  . Rotator cuff arthropathy of left shoulder 09/20/2017  . Cervical radiculitis 04/09/2017  . Left shoulder pain 04/09/2017  . Dyspnea on exertion 05/14/2016  . 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 Lymphedema 01/14/2016  . Left arm swelling 12/25/2015  . SVT (supraventricular tachycardia) ([facility]   . Left-sided carotid artery disease ([facility] 03/08/2015  . Dizziness 01/24/2015  . Urinary frequency 01/13/2015  . Dizziness and giddiness 01/09/2015  . Gallstones 02/21/2013  . Breast cancer of upper-inner quadrant of  right female breast ([place]) 11/28/2012  . Muscle cramping 09/12/2012  . Right hip pain 09/12/2012  . Lower back pain 09/12/2012  . COPD GOLD I    . Hx of radiation therapy   . Right shoulder pain 09/14/2011  . Preventative health care 07/29/2010  . Skin lesion of left leg 07/29/2010  . Paresthesia 07/29/2010  . GERD 03/28/2010  . CONSTIPATION 03/28/2010  . Osteoarthrosis, 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  . Anxiety state 05/03/2009  . Hyperlipidemia 06/14/2007  . FATIGUE 06/14/2007  . 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 DIVERTICULOSIS, COLON 12/17/2006  . Cough 12/17/2006  . IRRITABLE BOWEL SYNDROME, HX OF 12/17/2006    Ander Purpura, PT 04/19/2019, 2:30 PM  [place], [place], [address] Phone: [phone]   Fax:   [phone]  Name: MELVENIA FAVELA MRN: [id number] Date of Birth: 1940/04/25

## 2019-04-19 NOTE — Progress Notes (Signed)
Subjective:    Patient ID: Stacey [name], female    DOB: [birth year], 79 y.o.   MRN: [id number]  HPI  Here to f/u; overall doing ok,  Pt denies chest pain, increasing sob or doe, wheezing, orthopnea, PND, increased LE swelling, palpitations, dizziness or syncope.  Pt denies new neurological symptoms such as new headache, or facial or extremity weakness or numbness.  Pt denies polydipsia, polyuria, or low sugar episode.  Pt states overall good compliance with meds, mostly trying to follow appropriate diet, did not tolerate last BP med due to zombie and fatigue she attributes to the amlodipine 5 mg. Took at night last 2 days but did help, and BP 140's to 160's anyway for the most part taking three times per day Has gained several lbs with the pandemic  Had her covid shots x 2 Wt Readings from Last 3 Encounters:  04/19/19 174 lb 6.4 oz (79.1 kg)  03/30/19 174 lb (78.9 kg)  02/10/19 172 lb (78 kg)   BP Readings from Last 3 Encounters:  04/19/19 (!) 150/82  03/30/19 120/70  02/10/19 (!) 160/82  Has new wrap to the lymphedema LUE, changes every other day.   Past Medical History:  Diagnosis Date  . ANEMIA-NOS   . ANXIETY   . Breast cancer ([place]) 1997 L, 2012 R   s/p chemo/xrt  . COPD    resolved  . DIVERTICULOSIS, COLON 2008  . Dizziness   . GERD   . Hx of radiation therapy 10/19/11 -12/03/11   right breast  . HYPERLIPIDEMIA   . IRRITABLE BOWEL SYNDROME, HX OF   . Left-sided carotid artery disease (HCC)    moderate left ICA stenosis  . OSTEOARTHRITIS, HAND   . PSVT (paroxysmal supraventricular tachycardia) (HCC)    symptomatic on event monitor   Past Surgical History:  Procedure Laterality Date  . ABDOMINAL HYSTERECTOMY    . APPENDECTOMY    . BREAST BIOPSY  11/14/10    r breast: inv, insitu mammary carcinoma w/calcif, er/pr +, her2 -  . BREAST SURGERY     lumpectomy  . CATARACT EXTRACTION     both eyes  . ELECTROPHYSIOLOGIC STUDY N/A 05/10/2015   Procedure: SVT Ablation;  Surgeon:  Will Meredith Leeds, MD;  Location: [facility];  Service: Cardiovascular;  Laterality: N/A;  . HERNIA REPAIR    . inguinal herniorrhapy  1984   left  . rectal fissure repair    . s/p benign breast biopsy  2003   right  . s/p left foot surgury  2009  . 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 spiral fx left foot  2008   no surgury  . TMJ ARTHROPLASTY  1989  . [place]- to remove scar tissue growth   . TONSILLECTOMY      reports that she quit smoking about 3 years ago. Her smoking use included cigarettes. She has a 54.00 pack-year smoking history. She has never used smokeless tobacco. She reports current alcohol use. She reports that she does not use drugs. family history includes Alcohol abuse in her mother; Brain cancer in her cousin; Breast cancer in her cousin, cousin, and cousin; Colon polyps in her mother; Diabetes in her mother; Hypertension in her mother; Lung cancer in her maternal grandfather and paternal uncle; Lung cancer (age of onset: 51) in her maternal grandmother; Lymphoma in her brother; Pancreatic cancer (age of onset: 48) in her mother; Stroke in  her mother; Testicular cancer in her cousin; Uterine cancer (age of onset: 84) in her mother. Allergies  Allergen Reactions  . Bee Venom Swelling  . Clindamycin/Lincomycin Diarrhea and Nausea And Vomiting  . Codeine Nausea And Vomiting   Current Outpatient Medications on File Prior to Visit  Medication Sig Dispense Refill  . amLODipine (NORVASC) 5 MG tablet Take 1 tablet (5 mg total) by mouth daily. 90 tablet 3  . aspirin 81 MG EC tablet Take 81 mg by mouth daily.      . calcium-vitamin D (OSCAL WITH D) 500-200 MG-UNIT per tablet Take 1 tablet by mouth daily.    . cholecalciferol (VITAMIN D) 1000 UNITS tablet Take 1,000 Units by mouth daily.      . clotrimazole-betamethasone (LOTRISONE) cream Apply 1 application topically 2 (two) times daily as needed. 30 g 1  . diclofenac sodium (VOLTAREN) 1 % GEL  Apply 4 g topically 4 (four) times daily as needed. 400 g 11  . diphenhydrAMINE (BENADRYL) 25 MG tablet Take 25 mg by mouth every 6 (six) hours as needed (for bee stings).     . fluticasone (FLONASE) 50 MCG/ACT nasal spray Place 2 sprays into both nostrils daily.    Marland Kitchen loratadine (CLARITIN) 10 MG tablet Take 10 mg by mouth daily.    . meloxicam (MOBIC) 15 MG tablet TAKE 1 TABLET(15 MG) BY MOUTH DAILY 90 tablet 0  . Multiple Vitamin (MULTIVITAMIN) capsule Take 1 capsule by mouth daily.      Marland Kitchen omeprazole (PRILOSEC) 20 MG capsule TAKE 1 CAPSULE(20 MG) BY MOUTH DAILY. 90 capsule 0  . polyethylene glycol (MIRALAX / GLYCOLAX) packet take 1 packet once daily if needed for constipation 30 packet 11  . pseudoephedrine (SUDAFED) 30 MG tablet Take 30 mg by mouth every 4 (four) hours as needed for congestion.     . Tiotropium Bromide Monohydrate (SPIRIVA RESPIMAT) 2.5 MCG/ACT AERS Inhale 2 puffs into the lungs daily. 4 g 11  . zolpidem (AMBIEN) 10 MG tablet TAKE 1 TABLET(10 MG) BY MOUTH AT BEDTIME AS NEEDED 90 tablet 1   No current facility-administered medications on file prior to visit.   Review of Systems All otherwise neg per pt     Objective:   Physical Exam BP (!) 150/82   Pulse 94   Temp 98.5 F (36.9 C)   Ht 5' 3.5" (1.613 m)   Wt 174 lb 6.4 oz (79.1 kg)   SpO2 97%   BMI 30.41 kg/m  VS noted,  Constitutional: Pt appears in NAD HENT: Head: NCAT.  Right Ear: External ear normal.  Left Ear: External ear normal.  Eyes: . Pupils are equal, round, and reactive to light. Conjunctivae and EOM are normal Nose: without d/c or deformity Neck: Neck supple. Gross normal ROM Cardiovascular: Normal rate and regular rhythm.   Pulmonary/Chest: Effort normal and breath sounds without rales or wheezing.  Abd:  Soft, NT, ND, + BS, no organomegaly Neurological: Pt is alert. At baseline orientation, motor grossly intact Skin: Skin is warm. No rashes, other new lesions, no LE edema Psychiatric: Pt  behavior is normal without agitation  All otherwise neg per pt Lab Results  Component Value Date   WBC 7.8 07/13/2018   HGB 11.9 (L) 07/13/2018   HCT 35.1 (L) 07/13/2018   PLT 283.0 07/13/2018   GLUCOSE 110 (H) 07/13/2018   CHOL 193 07/13/2018   TRIG 231.0 (H) 07/13/2018   HDL 49.40 07/13/2018   LDLDIRECT 119.0 07/13/2018   LDLCALC 84 07/04/2009  ALT 16 07/13/2018   AST 22 07/13/2018   NA 143 07/13/2018   K 3.6 07/13/2018   CL 108 07/13/2018   CREATININE 0.95 07/13/2018   BUN 16 07/13/2018   CO2 24 07/13/2018   TSH 3.41 07/13/2018   HGBA1C 6.6 (H) 07/13/2018      Assessment & Plan:

## 2019-04-19 NOTE — Progress Notes (Signed)
000

## 2019-04-19 NOTE — Patient Instructions (Signed)
Ok to change the amlodipine to the generic for Micardis 40 mg per day  Continue to monitor your blood pressure as you do; if it does not seem effective in 2-3 weeks, please call and we can increase the dose to 80 mg per day  Please continue all other medications as before, and refills have been done if requested.  Please have the pharmacy call with any other refills you may need.  Please continue your efforts at being more active, low cholesterol diet, and weight control.  Please keep your appointments with your specialists as you may have planned

## 2019-04-21 ENCOUNTER — Encounter: Payer: Self-pay | Admitting: Rehabilitation

## 2019-04-21 ENCOUNTER — Ambulatory Visit: Payer: Medicare Other | Admitting: Rehabilitation

## 2019-04-21 ENCOUNTER — Other Ambulatory Visit: Payer: Self-pay

## 2019-04-21 DIAGNOSIS — Z86000 Personal history of in-situ neoplasm of breast: Secondary | ICD-10-CM | POA: Diagnosis not present

## 2019-04-21 DIAGNOSIS — M25512 Pain in left shoulder: Secondary | ICD-10-CM | POA: Diagnosis not present

## 2019-04-21 DIAGNOSIS — I890 Lymphedema, not elsewhere classified: Secondary | ICD-10-CM | POA: Diagnosis not present

## 2019-04-21 DIAGNOSIS — M25612 Stiffness of left shoulder, not elsewhere classified: Secondary | ICD-10-CM

## 2019-04-21 NOTE — Therapy (Signed)
[place], [place], [address] Phone: [phone]   Fax:  [phone]  Physical Therapy Treatment  Patient Details  Name: Stacey [name] MRN: [id number] Date of Birth: 06-Jul-1940 Referring Provider (PT): Lurline Del MD   Encounter Date: 04/21/2019  PT End of Session - 04/21/19 1216    Visit Number  9    Number of Visits  13    Date for PT Re-Evaluation  05/02/19    PT Start Time  1110    PT Stop Time  1213    PT Time Calculation (min)  63 min    Activity Tolerance  Patient tolerated treatment well    Behavior During Therapy  [facility] for tasks assessed/performed       Past Medical History:  Diagnosis Date  . ANEMIA-NOS   . ANXIETY   . Breast cancer ([place]) 1997 L, 2012 R   s/p chemo/xrt  . COPD    resolved  . DIVERTICULOSIS, COLON 2008  . Dizziness   . GERD   . Hx of radiation therapy 10/19/11 -12/03/11   right breast  . HYPERLIPIDEMIA   . IRRITABLE BOWEL SYNDROME, HX OF   . Left-sided carotid artery disease (HCC)    moderate left ICA stenosis  . OSTEOARTHRITIS, HAND   . PSVT (paroxysmal supraventricular tachycardia) (HCC)    symptomatic on event monitor    Past Surgical History:  Procedure Laterality Date  . ABDOMINAL HYSTERECTOMY    . APPENDECTOMY    . BREAST BIOPSY  11/14/10    r breast: inv, insitu mammary carcinoma w/calcif, er/pr +, her2 -  . BREAST SURGERY     lumpectomy  . CATARACT EXTRACTION     both eyes  . ELECTROPHYSIOLOGIC STUDY N/A 05/10/2015   Procedure: SVT Ablation;  Surgeon: Will Meredith Leeds, MD;  Location: [facility];  Service: Cardiovascular;  Laterality: N/A;  . HERNIA REPAIR    . inguinal herniorrhapy  1984   left  . rectal fissure repair    . s/p benign breast biopsy  2003   right  . s/p left foot surgury  2009  . 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 spiral fx left foot  2008   no surgury  . TMJ ARTHROPLASTY  1989  . [place]- to remove scar tissue growth   . TONSILLECTOMY      There were no vitals filed for this visit.  Subjective Assessment - 04/21/19 1115    Subjective  I could feel like my arm swells under the bandages i could feel it last night.    Pertinent History  Hx breast cancer with 15 lymph node removal on the L and Bil lumpectomy. Lumpectomy on the L in 1997and the R in 2012. Radiation Bil    Patient Stated Goals  I want to take care of the swelling in my arm and learn what to do about it.    Currently in Pain?  No/denies            LYMPHEDEMA/ONCOLOGY QUESTIONNAIRE - 04/21/19 1116      Left Upper Extremity Lymphedema   15 cm Proximal to Olecranon Process  35.3 cm  (Pended)     10 cm Proximal to Olecranon Process  36 cm  (Pended)     Olecranon Process  30 cm  (Pended)     15 cm Proximal to Ulnar Styloid Process  28.5 cm  (  Pended)     10 cm Proximal to Ulnar Styloid Process  24.5 cm  (Pended)     Just Proximal to Ulnar Styloid Process  19 cm  (Pended)     Across Hand at [employer]  20 cm  (Pended)     At [place] of 2nd Digit  6.8 cm  (Pended)            Outpatient Re[facility]  Lymphedema Life Impact Scale Total Score  45.59 %           [facility] Adult PT Treatment/Exercise - 04/21/19 0001      Manual Therapy   Edema Management  remeasured UE    Manual Lymphatic Drainage (MLD)  In supine: short neck, bil shoulder collectors, bil upper trapezius, 5 diaphragmatic breaths, Rt axillary nodes, Lt inguinal nodes, L axillo-inguinal and anterior inter-axillary anastomosis, L shoulder, Lateral brachium, medial to lateral brachium, lateral brachium, antecubital fossa, all surfaces of the antebrachium, dorsum of the hand, then retraced all steps finshing with lymph nodes.     Compression Bandaging  Thick massage cream, thick stockinette, Artiflex x1 with 1/2 inch gray foam at the dorsum of the hand, the anterior antebrachium to  the antecubital fossa and on the lateral brachium , extra time taken to get all of the air out of the foam. Very minimal pull due to pt continues with numbness in her arm. No finger wraps  1-6, 1-8c(herring bone), 2-10 and 1-12 cm short stretch compression bandages from hand to axilla.                PT Short Term Goals - 03/28/19 1714      PT SHORT TERM GOAL #1   Title  Pt will be independent with HEP and MLD within 2 weeks in order to decrease improve autonomy of care.    Baseline  Pt does not perform exercise or MLD.    Time  2    Period  Weeks    Status  New    Target Date  04/18/19        PT Long Term Goals - 03/28/19 1715      PT LONG TERM GOAL #1   Title  Patient will have reduction of LUE limb girth by 3 cm or greater in the proximal and distal brachium/antebrachium within 4 weeks in order to demonstrate decrease edema.    Baseline  see measurements.    Time  4    Period  Weeks    Status  New    Target Date  05/02/19      PT LONG TERM GOAL #2   Title  Patient to be properly fitted with appropriate compression garment to wear on daily basis for home management.    Baseline  pt currently has 20-30 mmHG arm garment, no glove    Time  4    Period  Weeks    Status  New    Target Date  05/02/19      PT LONG TERM GOAL #3   Title  Pt will improve A/ROM of the L shoulder to 120 degrees flexion/abduction within 6 weeks in order to decrease risk for immobility.    Baseline  80 degrees L shoulder flexion, 75 degrees L shoulder abduction.    Time  4    Period  Weeks    Status  New    Target Date  05/02/19      PT LONG  TERM GOAL #4   Title  Pt will report 50% improvement in pain/heaviness in the LUE within 4 weeks in order to demonstrate an improvementin quality of life.    Baseline  pt reports 3/10 pain at the worst.    Time  4    Period  Weeks    Status  New    Target Date  05/02/19      PT LONG TERM GOAL #5   Title  Pt will improve LLIS to 30% or less within 4  weeks in order to demonstrate objective improvement in functional activitie.    Baseline  45.59%    Time  4    Period  Weeks    Status  New    Target Date  05/02/19            Plan - 04/21/19 1216    Clinical Impression Statement  continued CDT of the Lt UE. Pt measuring larger today than last week almost to baseline and with c/o feeling herself swelling in the bandages after they are on for more than 1 day.  Briefly discussed possibly self bandaging if this continues to happen if pt is able to manage.    PT Frequency  3x / week    PT Duration  4 weeks    PT Treatment/Interventions  Therapeutic activities;Therapeutic exercise;Iontophoresis 41m/ml Dexamethasone;Patient/family education;Manual techniques;Neuromuscular re-education    PT Next Visit Plan  Continue CDT including wrapping and MLD and myofascial release.       Patient will benefit from skilled therapeutic intervention in order to improve the following deficits and impairments:     Visit Diagnosis: History of ductal carcinoma in situ (DCIS) of breast  Lymphedema, not elsewhere classified  Left shoulder pain, unspecified chronicity  Stiffness of left shoulder, not elsewhere classified     Problem List Patient Active Problem List   Diagnosis Date Noted  . Pes anserine bursitis 03/31/2019  . HTN (hypertension) 02/10/2019  . Hyperglycemia 06/21/2018  . Left carpal tunnel syndrome 02/22/2018  . Rotator cuff arthropathy of left shoulder 09/20/2017  . Cervical radiculitis 04/09/2017  . Left shoulder pain 04/09/2017  . Dyspnea on exertion 05/14/2016  . 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 Lymphedema 01/14/2016  . Left arm swelling 12/25/2015  . SV[place] ([place]cardia) ([place]   . Left[place]sided carotid artery disease ([place] 03/08/2015  . Dizziness 01/24/2015  . Urinary frequency 01/13/2015  . Dizziness and giddiness 01/09/2015  . Gallstones 02/21/2013  . Breast cancer of  upper-inner quadrant of right female breast ([place] 11/28/2012  . Muscle cramping 09/12/2012  . Right hip pain 09/12/2012  . Lower back pain 09/12/2012  . COPD GOLD I    . Hx of radiation therapy   . Right shoulder pain 09/14/2011  . Preventative health care 07/29/2010  . Skin lesion of left leg 07/29/2010  . Paresthesia 07/29/2010  . GERD 03/28/2010  . CONSTIPATION 03/28/2010  . Osteoarthrosis, 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  . Anxiety state 05/03/2009  . Hyperlipidemia 06/14/2007  . FATIGUE 06/14/2007  . 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 DIVERTICULOSIS, COLON 12/17/2006  . Cough 12/17/2006  . IRRITABLE BOWEL SYNDROME, HX OF 12/17/2006    TStark Bray3/19/2021, 12:20 PM  [place]GN[place] [address]Phone: [phone]  Fax:  [phone] Name: Stacey WIGLEYMRN: [id number] of Birth: [birth year]

## 2019-04-24 ENCOUNTER — Telehealth: Payer: Self-pay | Admitting: Internal Medicine

## 2019-04-24 ENCOUNTER — Ambulatory Visit: Payer: Medicare Other

## 2019-04-24 ENCOUNTER — Other Ambulatory Visit: Payer: Self-pay

## 2019-04-24 ENCOUNTER — Other Ambulatory Visit: Payer: Self-pay | Admitting: Oncology

## 2019-04-24 DIAGNOSIS — I890 Lymphedema, not elsewhere classified: Secondary | ICD-10-CM

## 2019-04-24 DIAGNOSIS — C50919 Malignant neoplasm of unspecified site of unspecified female breast: Secondary | ICD-10-CM

## 2019-04-24 DIAGNOSIS — M25612 Stiffness of left shoulder, not elsewhere classified: Secondary | ICD-10-CM

## 2019-04-24 DIAGNOSIS — M25512 Pain in left shoulder: Secondary | ICD-10-CM | POA: Diagnosis not present

## 2019-04-24 DIAGNOSIS — Z86000 Personal history of in-situ neoplasm of breast: Secondary | ICD-10-CM

## 2019-04-24 NOTE — Therapy (Signed)
[place], [place], [address] Phone: [phone]   Fax:  [phone]  Physical Therapy 10th Visit Note  Progress Note Reporting Period 03/28/2019 to 04/24/2019  See note below for Objective Data and Assessment of Progress/Goals.       Patient Details  Name: Stacey [name] MRN: [id number] Date of Birth: 03-07-1940 Referring Provider (PT): Lurline Del MD   Encounter Date: 04/24/2019  PT End of Session - 04/24/19 1404    Visit Number  10    Number of Visits  13    Date for PT Re-Evaluation  05/02/19    PT Start Time  1400    PT Stop Time  1500    PT Time Calculation (min)  60 min    Activity Tolerance  Patient tolerated treatment well    Behavior During Therapy  [facility] for tasks assessed/performed       Past Medical History:  Diagnosis Date  . ANEMIA-NOS   . ANXIETY   . Breast cancer ([place]) 1997 L, 2012 R   s/p chemo/xrt  . COPD    resolved  . DIVERTICULOSIS, COLON 2008  . Dizziness   . GERD   . Hx of radiation therapy 10/19/11 -12/03/11   right breast  . HYPERLIPIDEMIA   . IRRITABLE BOWEL SYNDROME, HX OF   . Left-sided carotid artery disease (HCC)    moderate left ICA stenosis  . OSTEOARTHRITIS, HAND   . PSVT (paroxysmal supraventricular tachycardia) (HCC)    symptomatic on event monitor    Past Surgical History:  Procedure Laterality Date  . ABDOMINAL HYSTERECTOMY    . APPENDECTOMY    . BREAST BIOPSY  11/14/10    r breast: inv, insitu mammary carcinoma w/calcif, er/pr +, her2 -  . BREAST SURGERY     lumpectomy  . CATARACT EXTRACTION     both eyes  . ELECTROPHYSIOLOGIC STUDY N/A 05/10/2015   Procedure: SVT Ablation;  Surgeon: Will Meredith Leeds, MD;  Location: [facility];  Service: Cardiovascular;  Laterality: N/A;  . HERNIA REPAIR    . inguinal herniorrhapy  1984   left  . rectal fissure repair    . s/p benign breast biopsy  2003   right  . s/p left foot surgury   2009  . 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 spiral fx left foot  2008   no surgury  . TMJ ARTHROPLASTY  1989  . [place]- to remove scar tissue growth   . TONSILLECTOMY      There were no vitals filed for this visit.  Subjective Assessment - 04/24/19 1404    Subjective  Pt states that she is still feeling swollen. She states it feels like it swells at night.    Pertinent History  Hx breast cancer with 15 lymph node removal on the L and Bil lumpectomy. Lumpectomy on the L in 1997and the R in 2012. Radiation Bil    Patient Stated Goals  I want to take care of the swelling in my arm and learn what to do about it.    Currently in Pain?  No/denies    Multiple Pain Sites  No            LYMPHEDEMA/ONCOLOGY QUESTIONNAIRE - 04/24/19 1407      Left Upper Extremity Lymphedema   15 cm Proximal to Olecranon Process  32.7 cm    10 cm Proximal to  Olecranon Process  34.1 cm    Olecranon Process  29.9 cm    15 cm Proximal to Ulnar Styloid Process  28.9 cm    10 cm Proximal to Ulnar Styloid Process  26.5 cm    Just Proximal to Ulnar Styloid Process  18.8 cm    Across Hand at [employer]  17.2 cm    At [place] of 2nd Digit  6.3 cm           Outpatient Re[facility]28/[facility]  Lymphedema Life Impact Scale Total Score  45.59 %           [facility] Adult PT Treatment/Exercise - 04/24/19 0001      Manual Therapy   Manual Therapy  Manual Lymphatic Drainage (MLD);Compression Bandaging;Edema management    Edema Management  re-measuremed UE circumferential measurements .    Manual Lymphatic Drainage (MLD)  In supine: short neck, bil shoulder collectors, bil upper trapezius, 5 diaphragmatic breaths, Rt axillary nodes, Lt inguinal nodes, L axillo-inguinal and anterior inter-axillary anastomosis, superior/inferior breast working toward anastomosis, figure 7 on the trunk working down toward groin, then re-worked breast and  anastomossis, L shoulder, Lateral brachium, medial to lateral brachium, lateral brachium, then re-worked anastomosis, antecubital fossa, all surfaces of the antebrachium, dorsum of the hand, then retraced all steps finshing with deep abdominals    Compression Bandaging  Pt was wrapped over the bresat with 1 artiflex and 1 12 cm short stretch bandage, 3 short stretch bandages used on the LUE with TG soft as base layer and artiflex for shaping. Finger wraps were not placed due to pt does not tolerate well and has no significant increase in swelling w/o wraps.              PT Education - 04/24/19 1700    Education Details  Pt was educated on her minimal reduction/maintenance. Discussed contacting MD for work-up due to possibility of another reason pt may not be reducing. She was wrapped at the breast and educated why breast compression is important due to congestion that may be interfereing with emptying the LUE.    Person(s) Educated  Patient    Methods  Explanation    Comprehension  Verbalized understanding       PT Short Term Goals - 03/28/19 1714      PT SHORT TERM GOAL #1   Title  Pt will be independent with HEP and MLD within 2 weeks in order to decrease improve autonomy of care.    Baseline  Pt does not perform exercise or MLD.    Time  2    Period  Weeks    Status  New    Target Date  04/18/19        PT Long Term Goals - 03/28/19 1715      PT LONG TERM GOAL #1   Title  Patient will have reduction of LUE limb girth by 3 cm or greater in the proximal and distal brachium/antebrachium within 4 weeks in order to demonstrate decrease edema.    Baseline  see measurements.    Time  4    Period  Weeks    Status  New    Target Date  05/02/19      PT LONG TERM GOAL #2   Title  Patient to be properly fitted with appropriate compression garment to wear on daily basis for home management.    Baseline  pt currently has 20-30 mmHG arm garment,  no glove    Time  4    Period  Weeks     Status  New    Target Date  05/02/19      PT LONG TERM GOAL #3   Title  Pt will improve A/ROM of the L shoulder to 120 degrees flexion/abduction within 6 weeks in order to decrease risk for immobility.    Baseline  80 degrees L shoulder flexion, 75 degrees L shoulder abduction.    Time  4    Period  Weeks    Status  New    Target Date  05/02/19      PT LONG TERM GOAL #4   Title  Pt will report 50% improvement in pain/heaviness in the LUE within 4 weeks in order to demonstrate an improvementin quality of life.    Baseline  pt reports 3/10 pain at the worst.    Time  4    Period  Weeks    Status  New    Target Date  05/02/19      PT LONG TERM GOAL #5   Title  Pt will improve LLIS to 30% or less within 4 weeks in order to demonstrate objective improvement in functional activitie.    Baseline  45.59%    Time  4    Period  Weeks    Status  New    Target Date  05/02/19            Plan - 04/24/19 1404    Clinical Impression Statement  Pt continues with maintenance and very minimal if any reduction and continued increased edema in the L breast that was noticed at her last session with this physical therapist. Discussed possiblities with patient including that she needs to contact her MD; with pt permission her oncologist was contacted for possible follow-up appointment. Discussed trying to wrap the L breast this session to see if congest at the breast isn't interfereing with emptying of the LUE. MLD was performed with extra time spent at the trunk and the L breast this session re-working ansatomosis multiple times. Pt will benefit from continued POC at this time.    Personal Factors and Comorbidities  Comorbidity 2    Comorbidities  Bil radiation due to Bil breast cancer and lumpectomy, 15 lymph node removal on the L    Rehab Potential  Good    PT Frequency  3x / week    PT Duration  4 weeks    PT Treatment/Interventions  Therapeutic activities;Therapeutic exercise;Iontophoresis  26m/ml Dexamethasone;Patient/family education;Manual techniques;Neuromuscular re-education    PT Next Visit Plan  Continue CDT including wrapping and MLD and myofascial release. assess breast wrap    PT Home Exercise Plan  see pt instructions    Consulted and Agree with Plan of Care  Patient       Patient will benefit from skilled therapeutic intervention in order to improve the following deficits and impairments:  Decreased range of motion, Pain, Increased edema  Visit Diagnosis: History of ductal carcinoma in situ (DCIS) of breast  Lymphedema, not elsewhere classified  Left shoulder pain, unspecified chronicity  Stiffness of left shoulder, not elsewhere classified     Problem List Patient Active Problem List   Diagnosis Date Noted  . Pes anserine bursitis 03/31/2019  . HTN (hypertension) 02/10/2019  . Hyperglycemia 06/21/2018  . Left carpal tunnel syndrome 02/22/2018  . Rotator cuff arthropathy of left shoulder 09/20/2017  . Cervical radiculitis 04/09/2017  . Left shoulder pain 04/09/2017  .  Dyspnea on exertion 05/14/2016  . 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 Lymphedema 01/14/2016  . Left arm swelling 12/25/2015  . SVT (supraventricular tachycardia) ([place])   . Left-sided carotid artery disease ([place]) 03/08/2015  . Dizziness 01/24/2015  . Urinary frequency 01/13/2015  . Dizziness and giddiness 01/09/2015  . Gallstones 02/21/2013  . Breast cancer of upper-inner quadrant of right female breast ([place]) 11/28/2012  . Muscle cramping 09/12/2012  . Right hip pain 09/12/2012  . Lower back pain 09/12/2012  . COPD GOLD I    . Hx of radiation therapy   . Right shoulder pain 09/14/2011  . Preventative health care 07/29/2010  . Skin lesion of left leg 07/29/2010  . Paresthesia 07/29/2010  . GERD 03/28/2010  . CONSTIPATION 03/28/2010  . Osteoarthrosis, 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  . Anxiety state 05/03/2009  . Hyperlipidemia 06/14/2007  . FATIGUE 06/14/2007   . 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 DIVERTICULOSIS, COLON 12/17/2006  . Cough 12/17/2006  . IRRITABLE BOWEL SYNDROME, HX OF 12/17/2006    Ander Purpura, PT 04/24/2019, 5:04 PM  [place], [place], [address] Phone: [phone]   Fax:  [phone]  Name: Stacey [name] MRN: [id number] Date of Birth: [birth year]

## 2019-04-24 NOTE — Telephone Encounter (Signed)
Ok for updated oncology referral for unresolving lymphedema and hx of breast ca  Ms Taum to please inform pt, I will do referral

## 2019-04-24 NOTE — Telephone Encounter (Signed)
-----   Message from Chauncey Cruel, MD sent at 04/24/2019  5:04 PM EDT ----- Regarding: RE: Lymphedema not reducing Thank you for your alert, Barnetta Chapel. I have not seen Ms Mckillop since 2017, when she was released to her PCP. I am copying him (Dr Jenny Reichmann) to let him know your concern. He can then either set her up for a chest CT or refer her back to [place] for evaluation. Would be glad to be of further help  GM ----- Message ----- From: Ander Purpura, PT Sent: 04/24/2019   3:01 PM EDT To: Chauncey Cruel, MD Subject: Lymphedema not reducing                        Dr. Jana Hakim,   I have been seeing Ms. Nienaber the lymphedema clinic for lymphedema on the L side. I saw that you were her oncologist for her cancer in the R breast. We have been working for about 4 weeks on the swelling in her LUE and L breast but we are getting very poor results with decongestive therapy and was concerned that there may be a reason other than past cancer treatment that is resulting in her poor progress. I have ordered her a pump but would like to see if you think she may possibly need imaging or a MD appointment prior to putting in her on a pump to make sure it isn't something more serious like possible re-occurrence. Thank you for your time.   Tomma Rakers DPT, CLT

## 2019-04-25 ENCOUNTER — Other Ambulatory Visit: Payer: Self-pay | Admitting: Internal Medicine

## 2019-04-25 NOTE — Telephone Encounter (Signed)
Please refill as per office routine med refill policy (all routine meds refilled for 3 mo or monthly per pt preference up to one year from last visit, then month to month grace period for 3 mo, then further med refills will have to be denied)  

## 2019-04-26 ENCOUNTER — Ambulatory Visit: Payer: Medicare Other

## 2019-04-26 ENCOUNTER — Telehealth: Payer: Self-pay | Admitting: *Deleted

## 2019-04-26 ENCOUNTER — Other Ambulatory Visit: Payer: Self-pay | Admitting: *Deleted

## 2019-04-26 ENCOUNTER — Other Ambulatory Visit: Payer: Self-pay

## 2019-04-26 DIAGNOSIS — M25612 Stiffness of left shoulder, not elsewhere classified: Secondary | ICD-10-CM

## 2019-04-26 DIAGNOSIS — M25512 Pain in left shoulder: Secondary | ICD-10-CM

## 2019-04-26 DIAGNOSIS — Z86000 Personal history of in-situ neoplasm of breast: Secondary | ICD-10-CM | POA: Diagnosis not present

## 2019-04-26 DIAGNOSIS — Z170 Estrogen receptor positive status [ER+]: Secondary | ICD-10-CM

## 2019-04-26 DIAGNOSIS — M12812 Other specific arthropathies, not elsewhere classified, left shoulder: Secondary | ICD-10-CM

## 2019-04-26 DIAGNOSIS — C50211 Malignant neoplasm of upper-inner quadrant of right female breast: Secondary | ICD-10-CM

## 2019-04-26 DIAGNOSIS — I890 Lymphedema, not elsewhere classified: Secondary | ICD-10-CM | POA: Diagnosis not present

## 2019-04-26 NOTE — Therapy (Signed)
[place], [place], [address] Phone: [phone]   Fax:  [phone]  Physical Therapy Treatment  Patient Details  Name: Stacey [name] MRN: [id number] Date of Birth: Feb 27, 1940 Referring Provider (PT): Lurline Del MD   Encounter Date: 04/26/2019  PT End of Session - 04/26/19 1311    Visit Number  11    Number of Visits  13    Date for PT Re-Evaluation  05/02/19    PT Start Time  1310    PT Stop Time  1334    PT Time Calculation (min)  24 min    Activity Tolerance  Patient tolerated treatment well    Behavior During Therapy  [facility] for tasks assessed/performed       Past Medical History:  Diagnosis Date  . ANEMIA-NOS   . ANXIETY   . Breast cancer ([place]) 1997 L, 2012 R   s/p chemo/xrt  . COPD    resolved  . DIVERTICULOSIS, COLON 2008  . Dizziness   . GERD   . Hx of radiation therapy 10/19/11 -12/03/11   right breast  . HYPERLIPIDEMIA   . IRRITABLE BOWEL SYNDROME, HX OF   . Left-sided carotid artery disease (HCC)    moderate left ICA stenosis  . OSTEOARTHRITIS, HAND   . PSVT (paroxysmal supraventricular tachycardia) (HCC)    symptomatic on event monitor    Past Surgical History:  Procedure Laterality Date  . ABDOMINAL HYSTERECTOMY    . APPENDECTOMY    . BREAST BIOPSY  11/14/10    r breast: inv, insitu mammary carcinoma w/calcif, er/pr +, her2 -  . BREAST SURGERY     lumpectomy  . CATARACT EXTRACTION     both eyes  . ELECTROPHYSIOLOGIC STUDY N/A 05/10/2015   Procedure: SVT Ablation;  Surgeon: Will Meredith Leeds, MD;  Location: [facility];  Service: Cardiovascular;  Laterality: N/A;  . HERNIA REPAIR    . inguinal herniorrhapy  1984   left  . rectal fissure repair    . s/p benign breast biopsy  2003   right  . s/p left foot surgury  2009  . 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 spiral fx left foot  2008   no surgury  . TMJ ARTHROPLASTY  1989  . [place]- to remove scar tissue growth   . TONSILLECTOMY      There were no vitals filed for this visit.  Subjective Assessment - 04/26/19 1311    Subjective  Pt states that she woke up with pain in her 1st and 2nd finger and she took off her hand wrap. She states that she took the rest of the wrap off this morning. She reports that she had to constantly pull her wrap up over her breast.    Pertinent History  Hx breast cancer with 15 lymph node removal on the L and Bil lumpectomy. Lumpectomy on the L in 1997and the R in 2012. Radiation Bil    Patient Stated Goals  I want to take care of the swelling in my arm and learn what to do about it.            LYMPHEDEMA/ONCOLOGY QUESTIONNAIRE - 04/26/19 1327      Left Upper Extremity Lymphedema   15 cm Proximal to Olecranon Process  33.3 cm    10 cm Proximal to Olecranon Process  36 cm    Olecranon Process  31.5 cm    15 cm Proximal to Ulnar Styloid Process  29.5 cm    10 cm Proximal to Ulnar Styloid Process  26 cm    Just Proximal to Ulnar Styloid Process  19.5 cm    Across Hand at [employer]  19 cm    At [place] of 2nd Digit  6.8 cm           Outpatient Rehab from [facility]/28/[facility]  Lymphedema Life Impact Scale Total Score  45.59 %                   PT Education - 04/26/19 1503    Education Details  Pt was educated that since she has had an increase in edema since her last session despite being wrapped that she needs to see her MD prior to returning to physical therapy. Pt gave permission for the physical therapist to contact her MD which was done and the pt was also encouraged to contact her oncologist once she leaves her appointment.    Person(s) Educated  Patient    Methods  Explanation    Comprehension  Verbalized understanding       PT Short Term Goals - 04/24/19 1706      PT SHORT TERM GOAL #1   Title  Pt will be independent with HEP and MLD within 2 weeks in  order to decrease improve autonomy of care.    Baseline  Pt is performing exercise and self MLD at home every day    Status  Achieved        PT Long Term Goals - 04/24/19 1706      PT LONG TERM GOAL #1   Title  Patient will have reduction of LUE limb girth by 3 cm or greater in the proximal and distal brachium/antebrachium within 4 weeks in order to demonstrate decrease edema.    Baseline  pt has maintained and is not reducing (see measurements)    Time  4    Period  Weeks    Status  On-going    Target Date  05/02/19      PT LONG TERM GOAL #2   Title  Patient to be properly fitted with appropriate compression garment to wear on daily basis for home management.    Baseline  pt currently has 20-30 mmHG arm garment, no glove    Time  4    Period  Weeks    Status  On-going    Target Date  05/02/19      PT LONG TERM GOAL #3   Title  Pt will improve A/ROM of the L shoulder to 120 degrees flexion/abduction within 6 weeks in order to decrease risk for immobility.    Baseline  Not measured today    Time  4    Period  Weeks    Status  On-going    Target Date  05/02/19      PT LONG TERM GOAL #4   Title  Pt will report 50% improvement in pain/heaviness in the LUE within 4 weeks in order to demonstrate an improvementin quality of life.    Baseline  Pt continues to report 3/10 pain at the worst but usually denies pain    Time  4    Period  Weeks    Status  On-going    Target Date  05/02/19      PT LONG TERM GOAL #5   Title  Pt will  improve LLIS to 30% or less within 4 weeks in order to demonstrate objective improvement in functional activitie.    Baseline  not taken today    Time  4    Period  Weeks    Status  On-going    Target Date  05/02/19            Plan - 04/26/19 1310    Clinical Impression Statement  Pt presents to physical therapy this session with increased circumferential measurements from her last physical therapy despite consistent wrapping of the LUE and  wrapping of the L breast last session. She initially had some reduction but has started to increase in size and is almost back to her baseline measurements. Pt was educated in depth that the cause of the swelling needs to be address prior to continueing with complete decongestive therapy. Pt is aware and Dr. Jana Hakim as well as the nurse navigator Bary Castilla were contacted to set up appointments for patient. If pt test results are clear she can begin physical therapy again working on decongestive therapy focusing on the trunk extensively before working on the arm. Pt is aware that she is on hold until she has been to see MD and no other cause for swelling has been found.    Personal Factors and Comorbidities  Comorbidity 2    Comorbidities  Bil radiation due to Bil breast cancer and lumpectomy, 15 lymph node removal on the L    Rehab Potential  Good    PT Frequency  3x / week    PT Duration  4 weeks    PT Treatment/Interventions  Therapeutic activities;Therapeutic exercise;Iontophoresis '4mg'$ /ml Dexamethasone;Patient/family education;Manual techniques;Neuromuscular re-education    PT Next Visit Plan  ON HOLD until she sees MD, Continue CDT including wrapping and MLD and myofascial release. assess breast wrap    PT Home Exercise Plan  see pt instructions    Consulted and Agree with Plan of Care  Patient       Patient will benefit from skilled therapeutic intervention in order to improve the following deficits and impairments:  Decreased range of motion, Pain, Increased edema  Visit Diagnosis: History of ductal carcinoma in situ (DCIS) of breast  Lymphedema, not elsewhere classified  Left shoulder pain, unspecified chronicity  Stiffness of left shoulder, not elsewhere classified     Problem List Patient Active Problem List   Diagnosis Date Noted  . Pes anserine bursitis 03/31/2019  . HTN (hypertension) 02/10/2019  . Hyperglycemia 06/21/2018  . Left carpal tunnel syndrome 02/22/2018  .  Rotator cuff arthropathy of left shoulder 09/20/2017  . Cervical radiculitis 04/09/2017  . Left shoulder pain 04/09/2017  . Dyspnea on exertion 05/14/2016  . 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 Lymphedema 01/14/2016  . Left arm swelling 12/25/2015  . SVT (supraventricular tachycardia) ([place])   . Left-sided carotid artery disease ([place]) 03/08/2015  . Dizziness 01/24/2015  . Urinary frequency 01/13/2015  . Dizziness and giddiness 01/09/2015  . Gallstones 02/21/2013  . Breast cancer of upper-inner quadrant of right female breast ([place]) 11/28/2012  . Muscle cramping 09/12/2012  . Right hip pain 09/12/2012  . Lower back pain 09/12/2012  . COPD GOLD I    . Hx of radiation therapy   . Right shoulder pain 09/14/2011  . Preventative health care 07/29/2010  . Skin lesion of left leg 07/29/2010  . Paresthesia 07/29/2010  . GERD 03/28/2010  . CONSTIPATION 03/28/2010  . Osteoarthrosis, 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  .  Anxiety state 05/03/2009  . Hyperlipidemia 06/14/2007  . FATIGUE 06/14/2007  . 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 DIVERTICULOSIS, COLON 12/17/2006  . Cough 12/17/2006  . IRRITABLE BOWEL SYNDROME, HX OF 12/17/2006    Ander Purpura, PT 04/26/2019, 5:04 PM  [place], [place], [address] Phone: [phone]   Fax:  [phone]  Name: Stacey [name] MRN: [id number] Date of Birth: 30-May-1940

## 2019-04-27 ENCOUNTER — Encounter: Payer: Self-pay | Admitting: Adult Health

## 2019-04-27 ENCOUNTER — Ambulatory Visit (HOSPITAL_COMMUNITY)
Admission: RE | Admit: 2019-04-27 | Discharge: 2019-04-27 | Disposition: A | Discharge status: Home / Self Care | Payer: Medicare Other | Source: Ambulatory Visit | Attending: Oncology | Admitting: Oncology

## 2019-04-27 ENCOUNTER — Other Ambulatory Visit: Payer: Self-pay

## 2019-04-27 ENCOUNTER — Other Ambulatory Visit: Payer: Self-pay | Admitting: Oncology

## 2019-04-27 ENCOUNTER — Inpatient Hospital Stay: Payer: Medicare Other | Attending: Adult Health | Admitting: Oncology

## 2019-04-27 VITALS — BP 138/67 | HR 110 | Temp 98.3°F | Resp 18 | Ht 63.5 in | Wt 172.5 lb

## 2019-04-27 DIAGNOSIS — D649 Anemia, unspecified: Secondary | ICD-10-CM | POA: Diagnosis not present

## 2019-04-27 DIAGNOSIS — Z923 Personal history of irradiation: Secondary | ICD-10-CM | POA: Diagnosis not present

## 2019-04-27 DIAGNOSIS — J449 Chronic obstructive pulmonary disease, unspecified: Secondary | ICD-10-CM | POA: Diagnosis not present

## 2019-04-27 DIAGNOSIS — C50211 Malignant neoplasm of upper-inner quadrant of right female breast: Secondary | ICD-10-CM

## 2019-04-27 DIAGNOSIS — F419 Anxiety disorder, unspecified: Secondary | ICD-10-CM | POA: Insufficient documentation

## 2019-04-27 DIAGNOSIS — Z7982 Long term (current) use of aspirin: Secondary | ICD-10-CM | POA: Diagnosis not present

## 2019-04-27 DIAGNOSIS — I471 Supraventricular tachycardia: Secondary | ICD-10-CM

## 2019-04-27 DIAGNOSIS — I890 Lymphedema, not elsewhere classified: Secondary | ICD-10-CM | POA: Diagnosis not present

## 2019-04-27 DIAGNOSIS — Z9221 Personal history of antineoplastic chemotherapy: Secondary | ICD-10-CM | POA: Diagnosis not present

## 2019-04-27 DIAGNOSIS — M12812 Other specific arthropathies, not elsewhere classified, left shoulder: Secondary | ICD-10-CM

## 2019-04-27 DIAGNOSIS — M7989 Other specified soft tissue disorders: Secondary | ICD-10-CM | POA: Diagnosis not present

## 2019-04-27 DIAGNOSIS — Z803 Family history of malignant neoplasm of breast: Secondary | ICD-10-CM | POA: Insufficient documentation

## 2019-04-27 DIAGNOSIS — Z170 Estrogen receptor positive status [ER+]: Secondary | ICD-10-CM | POA: Diagnosis not present

## 2019-04-27 DIAGNOSIS — M8580 Other specified disorders of bone density and structure, unspecified site: Secondary | ICD-10-CM | POA: Diagnosis not present

## 2019-04-27 DIAGNOSIS — K219 Gastro-esophageal reflux disease without esophagitis: Secondary | ICD-10-CM | POA: Diagnosis not present

## 2019-04-27 DIAGNOSIS — M25512 Pain in left shoulder: Secondary | ICD-10-CM | POA: Diagnosis not present

## 2019-04-27 DIAGNOSIS — Z853 Personal history of malignant neoplasm of breast: Secondary | ICD-10-CM | POA: Insufficient documentation

## 2019-04-27 DIAGNOSIS — Z79899 Other long term (current) drug therapy: Secondary | ICD-10-CM | POA: Insufficient documentation

## 2019-04-27 DIAGNOSIS — Z801 Family history of malignant neoplasm of trachea, bronchus and lung: Secondary | ICD-10-CM | POA: Diagnosis not present

## 2019-04-27 DIAGNOSIS — E785 Hyperlipidemia, unspecified: Secondary | ICD-10-CM | POA: Diagnosis not present

## 2019-04-27 DIAGNOSIS — Z87891 Personal history of nicotine dependence: Secondary | ICD-10-CM | POA: Insufficient documentation

## 2019-04-27 DIAGNOSIS — K589 Irritable bowel syndrome without diarrhea: Secondary | ICD-10-CM | POA: Insufficient documentation

## 2019-04-27 NOTE — Progress Notes (Signed)
ID: [name]   DOB: [birth year]  MR#: [id number]  FUX#:[id number]  PCP: Biagio Borg, MD GYN:  Dr. Susette Racer SU:  Dr. Lucia Gaskins  OTHER MD:   INTERVAL HISTORY:   Amany was released from follow-up here about 3 years ago.  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  She tells me she has had left upper extremity swelling since 2017.  For some reason this became much worse in the summer 2020.  There is no particular reason associated with this change that she is aware of.  Of course she had not traveled in the middle of the Covid year but she had also not done any unusual activity.  She was evaluated by Dr. Tamala Julian in orthopedics and received a couple of injections which helped a little but did not solve the problem.    Paije has been receiving physical therapy for her lymphedema for several weeks.  Because of the lack of improvement they are considering a pump but before doing that they suggested that perhaps we should make sure there are no other reasons for the arm not to be improving more than expected.  REVIEW OF SYSTEMS: Aside from this issue detailed review of systems today was stable.  She is followed by cardiology for her palpitations and generally has excellent primary care through Dr. Jenny Reichmann  HISTORY OF PRESENT ILLNESS:  From the original intake note:  " Alyria Krack is a 79 year old [place] woman, who has a history of breast cancer starting in 1997 and in 2013.    On 11/22/95, she underwent a left breast biopsy, which revealed malignant cells.  She had a left breast lumpectomy with re-excision on 11/29/95, with final pathology revealed IDC, grade 2, 2.5 cm, ER 98%, PR 97%, Ki67 8% with one of 15 nodes being positive.  She received chemotherapy, 4 cycles per patient, along with radiation and took Tamoxifen for 7 years following radiation completion.  Records from 1997 limited.    In October 2012, she had a biopsy of the right breast after mammography  recommended additional images for a dense area in the right breast.  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  On 11/25/10, she had an MRI that measured the area in the right breast as 2.2 cm.  She started neoadjuvant [name] in November 2012 and has continued it since.  The area of concern measured 1.7 cm on a repeat MRI in March of 2013.  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  Surgical margins were negative with one of one sentinel lymph node positive for metastatic carcinoma.  She then underwent radiation therapy under the care of Dr. [name] from 10/19/11 to 12/03/11. "   Subsequent history is as detailed below   PAST MEDICAL HISTORY: Past Medical History:  Diagnosis Date  . ANEMIA-NOS   . ANXIETY   . Breast cancer ([place]) 1997 L, 2012 R   s/p chemo/xrt  . COPD    resolved  . DIVERTICULOSIS, COLON 2008  . Dizziness   . GERD   . Hx of radiation therapy 10/19/11 -12/03/11   right breast  . HYPERLIPIDEMIA   . IRRITABLE BOWEL SYNDROME, HX OF   . Left-sided carotid artery  disease ([place])    moderate left ICA stenosis  . OSTEOARTHRITIS, HAND   . PSVT (paroxysmal supraventricular tachycardia) ([place])    symptomatic on event monitor    PAST SURGICAL HISTORY: Past Surgical History:  Procedure Laterality Date  . ABDOMINAL HYSTERECTOMY    . APPENDECTOMY    . BREAST BIOPSY  11/14/10    r breast: inv, insitu mammary carcinoma w/calcif, er/pr +, her2 -  . BREAST SURGERY     lumpectomy  . CATARACT EXTRACTION     both eyes  . ELECTROPHYSIOLOGIC STUDY N/A 05/10/2015   Procedure: SVT Ablation;  Surgeon: Will Meredith Leeds, MD;  Location: [facility];  Service: Cardiovascular;  Laterality: N/A;  .  HERNIA REPAIR    . inguinal herniorrhapy  1984   left  . rectal fissure repair    . s/p benign breast biopsy  2003   right  . s/p left foot surgury  2009  . 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 spiral fx left foot  2008   no surgury  . TMJ ARTHROPLASTY  1989  . [place]- to remove scar tissue growth   . TONSILLECTOMY      FAMILY HISTORY Family History  Problem Relation Age of Onset  . Alcohol abuse Mother        ETOH dependence  . Hypertension Mother   . Stroke Mother   . Colon polyps Mother   . Diabetes Mother   . Pancreatic cancer Mother 26  . Uterine cancer Mother 71  . Lymphoma Brother        burkitts  . Lung cancer Paternal Uncle   . Lung cancer Maternal Grandmother 30       non-smoker  . Lung cancer Maternal Grandfather   . Breast cancer Cousin        maternal cousin, dx in her mid 64s  . Brain cancer Cousin        maternal cousin's son; dx in his 3s  . Testicular cancer Cousin        maternal cousin's son;   . Breast cancer Cousin        paternal cousin; dx in her 68s  . Breast cancer Cousin        1 maternal, 2 paternal  . Colon cancer Neg Hx   . Esophageal cancer Neg Hx   . Stomach cancer Neg Hx   . Rectal cancer Neg Hx     GYNECOLOGIC HISTORY:   Menarche age 58, GX P0 (one miscarriage at 5 months). Status post vaginal hysterectomy in the late 1970s, no salpingo-oophorectomy   SOCIAL HISTORY:  The patient lives alone and is divorced. She retired from [employer], were she worked in Physiological scientist.Marland Kitchen     ADVANCED DIRECTIVES:  Living will in place  HEALTH MAINTENANCE: Social History   Tobacco Use  . Smoking status: Former Smoker    Packs/day: 1.00    Years: 54.00    Pack years: 54.00    Types: Cigarettes    Quit date: 01/03/2016    Years since quitting: 3.3  . Smokeless tobacco: Never Used  Substance Use Topics  . Alcohol use: Yes    Comment: rare/ drinks socially  . Drug use: No    Allergies  Allergen Reactions   . Bee Venom Swelling  . Clindamycin/Lincomycin Diarrhea and Nausea And Vomiting  . Codeine Nausea And Vomiting    Current Outpatient Medications  Medication Sig  Dispense Refill  . aspirin 81 MG EC tablet Take 81 mg by mouth daily.      . calcium-vitamin D (OSCAL WITH D) 500-200 MG-UNIT per tablet Take 1 tablet by mouth daily.    . cholecalciferol (VITAMIN D) 1000 UNITS tablet Take 1,000 Units by mouth daily.      . clotrimazole-betamethasone (LOTRISONE) cream Apply 1 application topically 2 (two) times daily as needed. 30 g 1  . diclofenac sodium (VOLTAREN) 1 % GEL Apply 4 g topically 4 (four) times daily as needed. 400 g 11  . diphenhydrAMINE (BENADRYL) 25 MG tablet Take 25 mg by mouth every 6 (six) hours as needed (for bee stings).     . fluticasone (FLONASE) 50 MCG/ACT nasal spray Place 2 sprays into both nostrils daily.    Marland Kitchen loratadine (CLARITIN) 10 MG tablet Take 10 mg by mouth daily.    . meloxicam (MOBIC) 15 MG tablet TAKE 1 TABLET(15 MG) BY MOUTH DAILY 90 tablet 0  . Multiple Vitamin (MULTIVITAMIN) capsule Take 1 capsule by mouth daily.      Marland Kitchen omeprazole (PRILOSEC) 20 MG capsule TAKE 1 CAPSULE(20 MG) BY MOUTH DAILY 90 capsule 3  . polyethylene glycol (MIRALAX / GLYCOLAX) packet take 1 packet once daily if needed for constipation 30 packet 11  . pseudoephedrine (SUDAFED) 30 MG tablet Take 30 mg by mouth every 4 (four) hours as needed for congestion.     Marland Kitchen telmisartan (MICARDIS) 40 MG tablet Take 1 tablet (40 mg total) by mouth daily. 90 tablet 3  . Tiotropium Bromide Monohydrate (SPIRIVA RESPIMAT) 2.5 MCG/ACT AERS Inhale 2 puffs into the lungs daily. 4 g 11  . zolpidem (AMBIEN) 10 MG tablet TAKE 1 TABLET(10 MG) BY MOUTH AT BEDTIME AS NEEDED 90 tablet 1   No current facility-administered medications for this visit.    OBJECTIVE: Older white woman in no acute distress Vitals:   04/27/19 1438  BP: 138/67  Pulse: (!) 110  Resp: 18  Temp: 98.3 F (36.8 C)  SpO2: 95%     Body  mass index is 30.08 kg/m.    ECOG FS:  1   Sclerae unicteric, EOMs intact Wearing a mask No cervical or supraclavicular adenopathy Lungs no rales or rhonchi Heart regular rate and rhythm Abd soft, nontender, positive bowel sounds MSK no focal spinal tenderness, grade 2 left upper extremity lymphedema without erythema Neuro: nonfocal, well oriented, appropriate affect Breasts: Status post bilateral lumpectomy and radiation.  There is no evidence of local recurrence.  Both axillae are benign.  LAB RESULTS: Lab Results  Component Value Date   WBC 7.8 07/13/2018   NEUTROABS 5.2 07/13/2018   HGB 11.9 (L) 07/13/2018   HCT 35.1 (L) 07/13/2018   MCV 85.6 07/13/2018   PLT 283.0 07/13/2018      Chemistry      Component Value Date/Time   NA 143 07/13/2018 0938   NA 140 12/09/2015 1409   K 3.6 07/13/2018 0938   K 4.2 12/09/2015 1409   CL 108 07/13/2018 0938   CL 107 02/02/2012 0939   CO2 24 07/13/2018 0938   CO2 25 12/09/2015 1409   BUN 16 07/13/2018 0938   BUN 16.7 12/09/2015 1409   CREATININE 0.95 07/13/2018 0938   CREATININE 0.8 12/09/2015 1409      Component Value Date/Time   CALCIUM 8.5 07/13/2018 0938   CALCIUM 9.4 12/09/2015 1409   ALKPHOS 61 07/13/2018 0938   ALKPHOS 54 12/09/2015 1409   AST 22 07/13/2018 8119  AST 20 12/09/2015 1409   ALT 16 07/13/2018 0938   ALT 13 12/09/2015 1409   BILITOT 0.4 07/13/2018 0938   BILITOT <0.22 12/09/2015 1409       Lab Results  Component Value Date   LABCA2 56 (H) 12/10/2010    No components found for: [id number]  No results for input(s): INR in the last 168 hours.  Urinalysis    Component Value Date/Time   COLORURINE YELLOW 07/13/2018 0938   APPEARANCEUR [place] (A) 07/13/2018 0938   LABSPEC 1.020 07/13/2018 0938   PHURINE 6.0 07/13/2018 0938   GLUCOSEU NEGATIVE 07/13/2018 0938   HGBUR NEGATIVE 07/13/2018 0938   BILIRUBINUR NEGATIVE 07/13/2018 0938   KETONESUR NEGATIVE 07/13/2018 0938   UROBILINOGEN 0.2  07/13/2018 0938   NITRITE NEGATIVE 07/13/2018 0938   LEUKOCYTESUR TRACE (A) 07/13/2018 0938    STUDIES: Bone density 04/02/2015 reviewed with patient  ASSESSMENT: 79 y.o. [place] woman:  #1  S/P LEFT breast lumpectomy with re-excision on 11/29/95 for a T2 N1bi IDC of the left breast, grade 2, Stage IIB, ER 98%, PR 97%, Ki67 8%.  #2  She underwent 4 cycles of AC followed by radiation therapy under the care of Dr. [name].  #3  She took Tamoxifen for a total of seven years   #4  S/P biopsy of the RIGHT breast Upper inner quadrant on 11/14/10 showing invasive ductal carcinoma,, grade 2, ER 85%, PR 57%, Ki67 20%, HER2 not amplified.    #5  She started neoadjuvant letrozole in November 2012; switched to tamoxifen as of February 2016 due to osteopenia concerns  #6  S/P right lumpectomy with sentinel lymph node biopsy on 09/03/11 for a ypT2, ypN1a, stage IIB invasive lobular carcinoma, grade 2,estrogen receptor 97% positive, progesterone receptor 12% positive, with no HER-2 amplification.  #7  She underwent radiation therapy under the care of Dr. [name] from 10/19/11 to 12/03/11.   #8 patient does not meet criteria for genetic testing according to her insurance company.  #9 osteopenia, with a T score of -1.8 on DEXA scan at [facility] 03/07/2013  (a) status post multiple dental extractions and implant-not a bisphosphonate candidate  (b) repeat [facility] density [facility] 04/02/2015 shows a T score of -2.0  PLAN:   Mateja is more than 20 years out from her left lumpectomy and 9 years out from her right lumpectomy.  There is no obvious disease recurrence and she generally looks well.  We just obtained a Doppler ultrasonography of her left upper extremity which did not show evidence of clot.  I am going to obtain a CT scan of the chest.  I expect that it will not be revealing.  Most likely she has progressive scarring in the left axilla (very difficult to demonstrate radiologically) to account for  the lack of improvement with physical therapy.  If the scan is negative as I expect the plan will be to proceed to a lymphedema pump.  I am scheduling a virtual phone visit in about 2 weeks just to discuss that result but I do not anticipate a need for further follow-up.  Total encounter time 30 minutes.Virgie Dad Eyoel Throgmorton    04/27/2019

## 2019-04-27 NOTE — Progress Notes (Signed)
VASCULAR LAB PRELIMINARY  PRELIMINARY  PRELIMINARY  PRELIMINARY  Left upper extremity venous duplex completed.    Preliminary report:  See CV proc for preliminary results.  Called Val with report  Lisandro Meggett, RVT 04/27/2019, 2:26 PM

## 2019-04-27 NOTE — Telephone Encounter (Signed)
Noted in chart patient has an appointment on tomorrow.

## 2019-04-27 NOTE — Telephone Encounter (Signed)
This RN was notified by PT that remote pt ( released from care in 2017) has new unresponsive lymphedema.  Her primary MD placed new referral for visit with this office.  Due to urgency pt put on schedule with NP and will have a doppler study prior for evaluation for possible DVT and then see NP for result and further evaluation.  Pt aware of all appts.

## 2019-04-28 ENCOUNTER — Telehealth: Payer: Self-pay | Admitting: Oncology

## 2019-04-28 ENCOUNTER — Ambulatory Visit: Payer: Medicare Other | Admitting: Rehabilitation

## 2019-04-28 NOTE — Telephone Encounter (Signed)
Scheduled appt per 3/25 los. Pt confirmed appt date and time. Pt aware that it's a telephone visit.

## 2019-04-29 ENCOUNTER — Other Ambulatory Visit: Payer: Self-pay | Admitting: Internal Medicine

## 2019-04-29 NOTE — Telephone Encounter (Signed)
Please refill as per office routine med refill policy (all routine meds refilled for 3 mo or monthly per pt preference up to one year from last visit, then month to month grace period for 3 mo, then further med refills will have to be denied)  

## 2019-05-01 ENCOUNTER — Other Ambulatory Visit: Payer: Self-pay

## 2019-05-01 ENCOUNTER — Ambulatory Visit: Payer: Medicare Other

## 2019-05-01 DIAGNOSIS — M25612 Stiffness of left shoulder, not elsewhere classified: Secondary | ICD-10-CM

## 2019-05-01 DIAGNOSIS — Z86000 Personal history of in-situ neoplasm of breast: Secondary | ICD-10-CM

## 2019-05-01 DIAGNOSIS — I890 Lymphedema, not elsewhere classified: Secondary | ICD-10-CM

## 2019-05-01 DIAGNOSIS — M25512 Pain in left shoulder: Secondary | ICD-10-CM | POA: Diagnosis not present

## 2019-05-01 NOTE — Therapy (Signed)
[place], [place], [address] Phone: [phone]   Fax:  [phone]  Physical Therapy Treatment  Patient Details  Name: Stacey [name] MRN: [id number] Date of Birth: 1940/11/18 Referring Provider (PT): Lurline Del MD   Encounter Date: 05/01/2019  PT End of Session - 05/01/19 1403    Visit Number  12    Number of Visits  13    Date for PT Re-Evaluation  05/02/19    PT Start Time  1400    PT Stop Time  1508    PT Time Calculation (min)  68 min    Activity Tolerance  Patient tolerated treatment well    Behavior During Therapy  [facility] for tasks assessed/performed       Past Medical History:  Diagnosis Date  . ANEMIA-NOS   . ANXIETY   . Breast cancer ([place]) 1997 L, 2012 R   s/p chemo/xrt  . COPD    resolved  . DIVERTICULOSIS, COLON 2008  . Dizziness   . GERD   . Hx of radiation therapy 10/19/11 -12/03/11   right breast  . HYPERLIPIDEMIA   . IRRITABLE BOWEL SYNDROME, HX OF   . Left-sided carotid artery disease (HCC)    moderate left ICA stenosis  . OSTEOARTHRITIS, HAND   . PSVT (paroxysmal supraventricular tachycardia) (HCC)    symptomatic on event monitor    Past Surgical History:  Procedure Laterality Date  . ABDOMINAL HYSTERECTOMY    . APPENDECTOMY    . BREAST BIOPSY  11/14/10    r breast: inv, insitu mammary carcinoma w/calcif, er/pr +, her2 -  . BREAST SURGERY     lumpectomy  . CATARACT EXTRACTION     both eyes  . ELECTROPHYSIOLOGIC STUDY N/A 05/10/2015   Procedure: SVT Ablation;  Surgeon: Will Meredith Leeds, MD;  Location: [facility];  Service: Cardiovascular;  Laterality: N/A;  . HERNIA REPAIR    . inguinal herniorrhapy  1984   left  . rectal fissure repair    . s/p benign breast biopsy  2003   right  . s/p left foot surgury  2009  . 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 spiral fx left foot  2008   no surgury  . TMJ ARTHROPLASTY  1989  . [place]- to remove scar tissue growth   . TONSILLECTOMY      There were no vitals filed for this visit.  Subjective Assessment - 05/01/19 1403    Subjective  Pt states that she saw the MD and was given a clear bill of health. She reports that she woke up last night with a lot of pain in her L thumb joint and that she is concerned about wrapping her hand due to the pain becuase then she won't be able to put the voltaren cream on her joint.    Pertinent History  Hx breast cancer with 15 lymph node removal on the L and Bil lumpectomy. Lumpectomy on the L in 1997and the R in 2012. Radiation Bil            LYMPHEDEMA/ONCOLOGY QUESTIONNAIRE - 05/01/19 1453      Left Upper Extremity Lymphedema   15 cm Proximal to Olecranon Process  36 cm    10 cm Proximal to Olecranon Process  36.9 cm    Olecranon Process  33 cm    15 cm Proximal to Ulnar Styloid Process  32 cm    10 cm Proximal to Ulnar Styloid Process  28 cm    Just Proximal to Ulnar Styloid Process  20.4 cm    Across Hand at [employer]  19.4 cm    At [place] of 2nd Digit  7.4 cm           Outpatient Re[facility] from [facility]/28/[facility]  Lymphedema Life Impact Scale Total Score  45.59 %           [facility] Adult PT Treatment/Exercise - 05/01/19 0001      Exercises   Exercises  Shoulder      Shoulder Exercises: Supine   External Rotation  AROM;10 reps;Both    External Rotation Limitations  hands behind head squeeze elbows together and push toward table 10x w/VC for correct movement.     Flexion  AAROM    Flexion Limitations  grasp the R hand/L hand together and lift over head in a lift chop w/demonstration 10x     Other Supine Exercises  Supine elbow flexion/extension 10x w/VC for correct movement and demonstration    Other Supine Exercises  supine fist pump 10x with hand above the heart level with VC and demonstration for correct movement.       Manual Therapy   Manual Therapy  Manual  Lymphatic Drainage (MLD);Compression Bandaging;Edema management    Edema Management  circumferential measurements of the LUE    Myofascial Release  Along the L inferior breast, lateral breast and into the L axilla, L axilla into the medial L brachium. Pt reported pain in her antebrachium laterally and at her fingers where she reports nerve pain indicating possible aggravation at the fascia.Decreased fluid build in L breast and brachium following myofascial release.     Manual Lymphatic Drainage (MLD)  in supine: 4x sweep up the anterior then posterior aspect of the LUE, L axillary nodes, sweep down the L axillo-inguinal anastomosis.     Compression Bandaging  Pt was wrapped with layered compression wrap using 4 short stretch bandages, TG soft as a base layer, 3 artiflex and 3 inch elastomull folded for digits 1-4             PT Education - 05/01/19 1509    Education Details  Access Code: [email or web address], Pt was educated on HEP for ROM and fluid pump in order to move fluid out of the LUE and to facilitation fluid flow/break up scar tissue. Discussed self wrapping and pt was provided with handout for exercises and self wrapping. Pt will take her wrap off tomorrow evening and try self wrapping. She will wear TG soft if she is unable to get it adequate. She can remove to shower on Wednesday then will wrap first thing on Wednesday with physical therapist supervision.    Person(s) Educated  Patient    Methods  Explanation;Handout;Verbal cues    Comprehension  Verbalized understanding;Returned demonstration       PT Short Term Goals - 04/24/19 1706      PT SHORT TERM GOAL #1   Title  Pt will be independent with HEP and MLD within 2 weeks in order to decrease improve autonomy of care.    Baseline  Pt is performing exercise and self MLD at home every day    Status  Achieved        PT Long Term Goals - 04/24/19 1706      PT LONG TERM GOAL #1   Title  Patient will have  reduction of LUE limb girth by  3 cm or greater in the proximal and distal brachium/antebrachium within 4 weeks in order to demonstrate decrease edema.    Baseline  pt has maintained and is not reducing (see measurements)    Time  4    Period  Weeks    Status  On-going    Target Date  05/02/19      PT LONG TERM GOAL #2   Title  Patient to be properly fitted with appropriate compression garment to wear on daily basis for home management.    Baseline  pt currently has 20-30 mmHG arm garment, no glove    Time  4    Period  Weeks    Status  On-going    Target Date  05/02/19      PT LONG TERM GOAL #3   Title  Pt will improve A/ROM of the L shoulder to 120 degrees flexion/abduction within 6 weeks in order to decrease risk for immobility.    Baseline  Not measured today    Time  4    Period  Weeks    Status  On-going    Target Date  05/02/19      PT LONG TERM GOAL #4   Title  Pt will report 50% improvement in pain/heaviness in the LUE within 4 weeks in order to demonstrate an improvementin quality of life.    Baseline  Pt continues to report 3/10 pain at the worst but usually denies pain    Time  4    Period  Weeks    Status  On-going    Target Date  05/02/19      PT LONG TERM GOAL #5   Title  Pt will improve LLIS to 30% or less within 4 weeks in order to demonstrate objective improvement in functional activitie.    Baseline  not taken today    Time  4    Period  Weeks    Status  On-going    Target Date  05/02/19            Plan - 05/01/19 1402    Clinical Impression Statement  Pt presents to physical therapy after seeing her oncologist with imaging done demonstratin no significant findings. Oncologist suspects progressive scar tissue. She will have a CT scan done next week as a precaution. Physician believes it is okay to continue with physical therapy services at this time. Myofascial release performed in side-lying and in supine along the inferior L breast, L lateral trunk wall, L axilla and L medial  brachium with decrease fluid in the L breast and L brachium following myofascial release. Pt was reporting pain in her distribution of numbness during myofascial release demonstratin possible scar adhesions near or on brachial plexus. Light cross friction and longitudinal myofascial release followed by modified MLD of the LUE. Pt was taken through exercises for AA/ROM and AROM of the LUE with muscle pump activity in order to facilitate fluid flow out of the LUE. Pt was instructed on wrapping and provided with handout. She will practice self wrapping at home due to time and will returnon her next visit to demonstrate for physical therapist. Pt was wrapped with layered compression bandage this session with finger wraps due to increased fluid in the digits. 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 Patient has been compliant with layered compression wrapping, exercise and manual lymph drainage for 4 weeks with significant symptoms of hyperplasia remaining and increasing almost  back to her baseline. Pt will benefit from continued POC at this time.    Personal Factors and Comorbidities  Comorbidity 2    Comorbidities  Bil radiation due to Bil breast cancer and lumpectomy, 15 lymph node removal on the L    Rehab Potential  Good    PT Frequency  3x / week    PT Duration  4 weeks    PT Treatment/Interventions  Therapeutic activities;Therapeutic exercise;Iontophoresis 50m/ml Dexamethasone;Patient/family education;Manual techniques;Neuromuscular re-education    PT Next Visit Plan  continue with myofascial release, did she get compression, Continue CDT including wrapping and MLD and myofascial release. assess breast wrap    PT Home Exercise Plan  Access Code: [email or web address]   Consulted and Agree with Plan of Care  Patient       Patient will benefit from skilled therapeutic intervention in order to improve the following deficits and impairments:  Decreased range of motion, Pain, Increased  edema  Visit Diagnosis: History of ductal carcinoma in situ (DCIS) of breast  Lymphedema, not elsewhere classified  Left shoulder pain, unspecified chronicity  Stiffness of left shoulder, not elsewhere classified     Problem List Patient Active Problem List   Diagnosis Date Noted  . Pes anserine bursitis 03/31/2019  . HTN (hypertension) 02/10/2019  . Hyperglycemia 06/21/2018  . Left carpal tunnel syndrome 02/22/2018  . Rotator cuff arthropathy of left shoulder 09/20/2017  . Cervical radiculitis 04/09/2017  . Left shoulder pain 04/09/2017  . Dyspnea on exertion 05/14/2016  . 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 Lymphedema of left arm 01/14/2016  . Left arm swelling 12/25/2015  . SVT ([place] tachycardia) ([place]   . Left[place]sided carotid artery disease ([place] 03/08/2015  . Dizziness 01/24/2015  . Urinary frequency 01/13/2015  . Dizziness and giddiness 01/09/2015  . Gallstones 02/21/2013  . Breast cancer of upper-inner quadrant of right female breast ([place] 11/28/2012  . Muscle cramping 09/12/2012  . Right hip pain 09/12/2012  . Lower back pain 09/12/2012  . COPD GOLD I    . Hx of radiation therapy   . Right shoulder pain 09/14/2011  . Preventative health care 07/29/2010  . Skin lesion of left leg 07/29/2010  . Paresthesia 07/29/2010  . GERD 03/28/2010  . CONSTIPATION 03/28/2010  . Osteoarthrosis, 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  . Anxiety state 05/03/2009  . Hyperlipidemia 06/14/2007  . FATIGUE 06/14/2007  . 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 DIVERTICULOSIS, COLON 12/17/2006  . Cough 12/17/2006  . IRRITABLE BOWEL SYNDROME, HX OF 12/17/2006    CAnder Purpura PT 05/01/2019, 3:23 PM  [place] [address]Phone: [phone]  Fax:  [phone] Name: Stacey DEMARTINIMRN: [id number] of Birth: [birth year]

## 2019-05-01 NOTE — Patient Instructions (Signed)
Access Code: [email or web address] URL: [email or web address] Date: 05/01/2019 Prepared by: Tomma Rakers  Exercises Supine Shoulder Flexion AAROM with Hands Clasped - 1 x daily - 7 x weekly - 1 sets - 10 reps Supine Chest Stretch with Elbows Bent - 1 x daily - 7 x weekly - 1 sets - 10 reps Supine Elbow Flexion Extension AROM - 1 x daily - 7 x weekly - 1 sets - 10 reps Hand Fist Pumps - 1 x daily - 7 x weekly - 1 sets - 10 reps

## 2019-05-03 ENCOUNTER — Other Ambulatory Visit: Payer: Self-pay

## 2019-05-03 ENCOUNTER — Ambulatory Visit: Payer: Medicare Other

## 2019-05-03 DIAGNOSIS — M25612 Stiffness of left shoulder, not elsewhere classified: Secondary | ICD-10-CM | POA: Diagnosis not present

## 2019-05-03 DIAGNOSIS — M25512 Pain in left shoulder: Secondary | ICD-10-CM | POA: Diagnosis not present

## 2019-05-03 DIAGNOSIS — Z86000 Personal history of in-situ neoplasm of breast: Secondary | ICD-10-CM | POA: Diagnosis not present

## 2019-05-03 DIAGNOSIS — I890 Lymphedema, not elsewhere classified: Secondary | ICD-10-CM

## 2019-05-03 NOTE — Therapy (Signed)
[place], [place], [address] Phone: [phone]   Fax:  [phone]  Physical Therapy Progress Note   Progress Note Reporting Period 03/28/2019 to 05/03/2019  See note below for Objective Data and Assessment of Progress/Goals.       Patient Details  Name: Stacey [name] MRN: [id number] Date of Birth: 02/11/1940 Referring Provider (PT): Lurline Del MD   Encounter Date: 05/03/2019  PT End of Session - 05/03/19 1506    Visit Number  13    Number of Visits  13    Date for PT Re-Evaluation  05/02/19    PT Start Time  1505    PT Stop Time  1613    PT Time Calculation (min)  68 min    Activity Tolerance  Patient tolerated treatment well    Behavior During Therapy  [facility] for tasks assessed/performed       Past Medical History:  Diagnosis Date  . ANEMIA-NOS   . ANXIETY   . Breast cancer ([place]) 1997 L, 2012 R   s/p chemo/xrt  . COPD    resolved  . DIVERTICULOSIS, COLON 2008  . Dizziness   . GERD   . Hx of radiation therapy 10/19/11 -12/03/11   right breast  . HYPERLIPIDEMIA   . IRRITABLE BOWEL SYNDROME, HX OF   . Left-sided carotid artery disease (HCC)    moderate left ICA stenosis  . OSTEOARTHRITIS, HAND   . PSVT (paroxysmal supraventricular tachycardia) (HCC)    symptomatic on event monitor    Past Surgical History:  Procedure Laterality Date  . ABDOMINAL HYSTERECTOMY    . APPENDECTOMY    . BREAST BIOPSY  11/14/10    r breast: inv, insitu mammary carcinoma w/calcif, er/pr +, her2 -  . BREAST SURGERY     lumpectomy  . CATARACT EXTRACTION     both eyes  . ELECTROPHYSIOLOGIC STUDY N/A 05/10/2015   Procedure: SVT Ablation;  Surgeon: Will Meredith Leeds, MD;  Location: [facility];  Service: Cardiovascular;  Laterality: N/A;  . HERNIA REPAIR    . inguinal herniorrhapy  1984   left  . rectal fissure repair    . s/p benign breast biopsy  2003   right  . s/p left foot surgury   2009  . 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 spiral fx left foot  2008   no surgury  . TMJ ARTHROPLASTY  1989  . [place]- to remove scar tissue growth   . TONSILLECTOMY      There were no vitals filed for this visit.  Subjective Assessment - 05/03/19 1507    Subjective  Pt reports that she just got back from the dentist for teeth cleaning. The thumb wrap came off the next day but the other finger wraps stayed on pretty well. She took her wrap off about 12 today.    Pertinent History  Hx breast cancer with 15 lymph node removal on the L and Bil lumpectomy. Lumpectomy on the L in 1997and the R in 2012. Radiation Bil    Patient Stated Goals  I want to take care of the swelling in my arm and learn what to do about it.    Currently in Pain?  No/denies    Pain Score  0-No pain            LYMPHEDEMA/ONCOLOGY QUESTIONNAIRE - 05/03/19 1515      Left  Upper Extremity Lymphedema   15 cm Proximal to Olecranon Process  32.5 cm    10 cm Proximal to Olecranon Process  35.6 cm    Olecranon Process  31.9 cm    15 cm Proximal to Ulnar Styloid Process  30.8 cm    10 cm Proximal to Ulnar Styloid Process  28.2 cm    Just Proximal to Ulnar Styloid Process  19.4 cm    Across Hand at [employer]  17.5 cm    At [place] of 2nd Digit  6.2 cm           Outpatient Re[facility] from [facility]  Lymphedema Life Impact Scale Total Score  45.59 %           [facility] Adult PT Treatment/Exercise - 05/03/19 0001      Manual Therapy   Manual Therapy  Manual Lymphatic Drainage (MLD);Compression Bandaging;Edema management;Passive ROM;Myofascial release    Edema Management  circumferential measurements of the LUE    Myofascial Release  Along the L inferior breast, lateral breast and into the L axilla, L axilla into the medial L brachium. Pt continue to report pain in her antebrachium laterally and at her fingers where she reports nerve  pain indicating possible aggravation at the fascia.Decreased fluid build in L breast and brachium following myofascial release.     Manual Lymphatic Drainage (MLD)  In supine: short neck, bil shoulder collectors, bil upper trapezius, 5 diaphragmatic breaths, Rt axillary nodes, Lt inguinal nodes, L axillo-inguinal and anterior inter-axillary anastomosis, superior/inferior breast working toward anastomosis, figure 7 on the trunk working down toward groin, then re-worked breast and anastomossis, L shoulder, Lateral brachium, medial to lateral brachium, lateral brachium, then re-worked anastomosis, antecubital fossa, all surfaces of the antebrachium, dorsum of the hand, then retraced all steps finshing with deep abdominals    Compression Bandaging  Pt was wrapped with layered compression wrap using 4 short stretch bandages, TG soft as a base layer, 3 artiflex and 1 inch elastomull for digits 1-4    Passive ROM  Into flexion/abduction with light mobs inferior andposterior with significant improvement in P/ROM following multiple repetitions             PT Education - 05/03/19 1728    Education Details  Pt will continue wearing her wrap at home as much as she is able    Person(s) Educated  Patient    Methods  Explanation    Comprehension  Verbalized understanding       PT Short Term Goals - 05/03/19 1518      PT SHORT TERM GOAL #1   Title  Pt will be independent with HEP and MLD within 2 weeks in order to decrease improve autonomy of care.    Baseline  Pt currently is not performing MLD at home. and doing exercises occasionally    Time  3    Period  Weeks    Status  On-going    Target Date  05/31/19        PT Long Term Goals - 05/03/19 1519      PT LONG TERM GOAL #1   Title  Patient will have reduction of LUE limb girth by 3 cm or greater in the proximal and distal brachium/antebrachium within 4 weeks in order to demonstrate decrease edema.    Baseline  pt is progressing slightly but  has scar tissue that is slowing progress see measurements.    Time  6  Period  Weeks    Status  On-going    Target Date  06/21/19      PT LONG TERM GOAL #2   Title  Patient to be properly fitted with appropriate compression garment to wear on daily basis for home management.    Baseline  Pt currently does not have compression that fits    Time  6    Period  Weeks    Status  On-going    Target Date  06/21/19      PT LONG TERM GOAL #3   Title  Pt will improve A/ROM of the L shoulder to 120 degrees flexion/abduction within 6 weeks in order to decrease risk for immobility.    Baseline  100 degrees flexion/70 degrees abduction    Time  6    Period  Weeks    Status  On-going    Target Date  06/21/19      PT LONG TERM GOAL #4   Title  Pt will report 50% improvement in pain/heaviness in the LUE within 4 weeks in order to demonstrate an improvementin quality of life.    Baseline  pt reports no significant improvement at this time.    Time  6    Period  Weeks    Status  On-going    Target Date  06/21/19      PT LONG TERM GOAL #5   Title  Pt will improve LLIS to 30% or less within 4 weeks in order to demonstrate objective improvement in functional activitie.            Plan - 05/03/19 1506    Clinical Impression Statement  Pt presents today w/o her wrap on and TG soft with 1 bandage at her hand. Pt continues with significant tightness in the fascial along the anterior chest/lateral trunk, in the L axilla and into the medial L brachium; very minimal improvement at end of session. Pt was wrapped with 4 short stretch bandages from hand to axilla. Pt has slow progress due to scarring in the axilla and along the lateral trunk wall. She continues with decreased ROM in the L shoulder and has started to decrease in circumferential measurements after myofascial release last week. Pt will benefit from continued physical therapy services 3x/week for 6 weeks to address the above mentioned  limitations in order to decrease risk for immobility and infection.    Personal Factors and Comorbidities  Comorbidity 2    Comorbidities  Bil radiation due to Bil breast cancer and lumpectomy, 15 lymph node removal on the L    Rehab Potential  Good    PT Frequency  3x / week    PT Duration  6 weeks    PT Treatment/Interventions  Therapeutic activities;Therapeutic exercise;Iontophoresis 46m/ml Dexamethasone;Patient/family education;Manual techniques;Neuromuscular re-education    PT Next Visit Plan  continue with myofascial release, did she get compression, Continue CDT including wrapping and MLD and myofascial release. assess breast wrap    PT Home Exercise Plan  Access Code: [email or web address]   Consulted and Agree with Plan of Care  Patient       Patient will benefit from skilled therapeutic intervention in order to improve the following deficits and impairments:  Decreased range of motion, Pain, Increased edema  Visit Diagnosis: History of ductal carcinoma in situ (DCIS) of breast  Lymphedema, not elsewhere classified  Left shoulder pain, unspecified chronicity  Stiffness of left shoulder, not elsewhere classified     Problem List Patient Active Problem  List   Diagnosis Date Noted  . Pes anserine bursitis 03/31/2019  . HTN (hypertension) 02/10/2019  . Hyperglycemia 06/21/2018  . Left carpal tunnel syndrome 02/22/2018  . Rotator cuff arthropathy of left shoulder 09/20/2017  . Cervical radiculitis 04/09/2017  . Left shoulder pain 04/09/2017  . Dyspnea on exertion 05/14/2016  . 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 Lymphedema of left arm 01/14/2016  . Left arm swelling 12/25/2015  . SVT (supraventricular tachycardia) ([place])   . Left-sided carotid artery disease ([place]) 03/08/2015  . Dizziness 01/24/2015  . Urinary frequency 01/13/2015  . Dizziness and giddiness 01/09/2015  . Gallstones 02/21/2013  . Breast cancer of upper-inner quadrant of right female  breast ([place]) 11/28/2012  . Muscle cramping 09/12/2012  . Right hip pain 09/12/2012  . Lower back pain 09/12/2012  . COPD GOLD I    . Hx of radiation therapy   . Right shoulder pain 09/14/2011  . Preventative health care 07/29/2010  . Skin lesion of left leg 07/29/2010  . Paresthesia 07/29/2010  . GERD 03/28/2010  . CONSTIPATION 03/28/2010  . Osteoarthrosis, 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  . Anxiety state 05/03/2009  . Hyperlipidemia 06/14/2007  . FATIGUE 06/14/2007  . 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 DIVERTICULOSIS, COLON 12/17/2006  . Cough 12/17/2006  . IRRITABLE BOWEL SYNDROME, HX OF 12/17/2006    Ander Purpura, PT 05/03/2019, 5:30 PM  [place], [place], [address] Phone: [phone]   Fax:  [phone]  Name: Stacey [name] MRN: [id number] Date of Birth: 1940-12-28

## 2019-05-03 NOTE — Addendum Note (Signed)
Addended by: Ander Purpura on: 05/03/2019 05:32 PM   Modules accepted: Orders

## 2019-05-08 ENCOUNTER — Other Ambulatory Visit: Payer: Self-pay

## 2019-05-08 ENCOUNTER — Ambulatory Visit: Payer: Medicare Other | Attending: Oncology

## 2019-05-08 ENCOUNTER — Other Ambulatory Visit: Payer: Self-pay | Admitting: *Deleted

## 2019-05-08 DIAGNOSIS — Z86000 Personal history of in-situ neoplasm of breast: Secondary | ICD-10-CM | POA: Insufficient documentation

## 2019-05-08 DIAGNOSIS — I890 Lymphedema, not elsewhere classified: Secondary | ICD-10-CM | POA: Diagnosis not present

## 2019-05-08 DIAGNOSIS — M25612 Stiffness of left shoulder, not elsewhere classified: Secondary | ICD-10-CM | POA: Insufficient documentation

## 2019-05-08 DIAGNOSIS — M25512 Pain in left shoulder: Secondary | ICD-10-CM | POA: Insufficient documentation

## 2019-05-08 DIAGNOSIS — C50211 Malignant neoplasm of upper-inner quadrant of right female breast: Secondary | ICD-10-CM

## 2019-05-08 NOTE — Therapy (Signed)
[place], [place], [address] Phone: [phone]   Fax:  [phone]  Physical Therapy Treatment  Patient Details  Name: Stacey [name] MRN: [id number] Date of Birth: 01-19-1941 Referring Provider (PT): Lurline Del MD   Encounter Date: 05/08/2019  PT End of Session - 05/08/19 1459    Visit Number  14    Number of Visits  31    Date for PT Re-Evaluation  05/02/19    PT Start Time  1508    PT Stop Time  1610    PT Time Calculation (min)  62 min    Activity Tolerance  Patient tolerated treatment well    Behavior During Therapy  [facility] for tasks assessed/performed       Past Medical History:  Diagnosis Date  . ANEMIA-NOS   . ANXIETY   . Breast cancer ([place]) 1997 L, 2012 R   s/p chemo/xrt  . COPD    resolved  . DIVERTICULOSIS, COLON 2008  . Dizziness   . GERD   . Hx of radiation therapy 10/19/11 -12/03/11   right breast  . HYPERLIPIDEMIA   . IRRITABLE BOWEL SYNDROME, HX OF   . Left-sided carotid artery disease (HCC)    moderate left ICA stenosis  . OSTEOARTHRITIS, HAND   . PSVT (paroxysmal supraventricular tachycardia) (HCC)    symptomatic on event monitor    Past Surgical History:  Procedure Laterality Date  . ABDOMINAL HYSTERECTOMY    . APPENDECTOMY    . BREAST BIOPSY  11/14/10    r breast: inv, insitu mammary carcinoma w/calcif, er/pr +, her2 -  . BREAST SURGERY     lumpectomy  . CATARACT EXTRACTION     both eyes  . ELECTROPHYSIOLOGIC STUDY N/A 05/10/2015   Procedure: SVT Ablation;  Surgeon: Will Meredith Leeds, MD;  Location: [place] [facility];  Service: Cardiovascular;  Laterality: N/A;  . HERNIA REPAIR    . inguinal herniorrhapy  1984   left  . rectal fissure repair    . s/p benign breast biopsy  2003   right  . s/p left foot surgury  2009  . 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 spiral fx left foot  2008   no surgury  . TMJ ARTHROPLASTY  1989  . [place]- to remove scar tissue growth   . TONSILLECTOMY      There were no vitals filed for this visit.  Subjective Assessment - 05/08/19 1459    Pertinent History  Hx breast cancer with 15 lymph node removal on the L and Bil lumpectomy. Lumpectomy on the L in 1997and the R in 2012. Radiation Bil    Patient Stated Goals  I want to take care of the swelling in my arm and learn what to do about it.    Currently in Pain?  No/denies    Pain Score  0-No pain            LYMPHEDEMA/ONCOLOGY QUESTIONNAIRE - 05/08/19 1514      Left Upper Extremity Lymphedema   15 cm Proximal to Olecranon Process  33 cm    10 cm Proximal to Olecranon Process  34 cm    Olecranon Process  30.9 cm    15 cm Proximal to Ulnar Styloid Process  29.9 cm    10 cm Proximal to Ulnar Styloid Process  26 cm    Just Proximal to Ulnar Styloid Process  19 cm    Across Hand at [employer]  17.3 cm    At [place] of 2nd Digit  6.3 cm           Outpatient Re[facility]  Lymphedema Life Impact Scale Total Score  45.59 %           [facility] Adult PT Treatment/Exercise - 05/08/19 0001      Manual Therapy   Manual Therapy  Manual Lymphatic Drainage (MLD);Compression Bandaging;Edema management;Passive ROM;Myofascial release    Edema Management  circumferential measurements of the LUE    Myofascial Release  Along the L inferior breast, lateral breast and into the L axilla, L axilla into the medial L brachium.Decreased fluid build in L breast and brachium following myofascial release.     Manual Lymphatic Drainage (MLD)  In supine: short neck, bil shoulder collectors, bil upper trapezius, 5 diaphragmatic breaths, Rt axillary nodes, Lt inguinal nodes, L axillo-inguinal and anterior inter-axillary anastomosis, superior/inferior breast working toward anastomosis, figure 7 on the trunk working down toward groin, then re-worked breast and anastomossis, L shoulder, Lateral  brachium, medial to lateral brachium, lateral brachium, then re-worked anastomosis, antecubital fossa, all surfaces of the antebrachium, dorsum of the hand, then retraced all steps finshing with deep abdominals    Compression Bandaging  Pt was wrapped with layered compression wrap using 4 short stretch bandages, TG soft as a base layer, 3 artiflex and 1 inch elastomull for digits 1-4    Passive ROM  Into flexion/abduction with light mobs inferior andposterior with significant improvement in P/ROM following multiple repetitions             PT Education - 05/08/19 1640    Education Details  Pt will continue to wear her wrap at home as much as she is able. Discussed possible compression garments with her once we are done wrapping and that she can pump in her wrap once she recieves her vasopneumatic pump. Discussed how to don compression vest due to pt recieved it in the mail. Including to step into it rather thanpull it over her head for ease of donning. She will practice this at home.    Person(s) Educated  Patient    Methods  Explanation;Demonstration;Verbal cues    Comprehension  Verbalized understanding       PT Short Term Goals - 05/03/19 1518      PT SHORT TERM GOAL #1   Title  Pt will be independent with HEP and MLD within 2 weeks in order to decrease improve autonomy of care.    Baseline  Pt currently is not performing MLD at home. and doing exercises occasionally    Time  3    Period  Weeks    Status  On-going    Target Date  05/31/19        PT Long Term Goals - 05/03/19 1519      PT LONG TERM GOAL #1   Title  Patient will have reduction of LUE limb girth by 3 cm or greater in the proximal and distal brachium/antebrachium within 4 weeks in order to demonstrate decrease edema.    Baseline  pt is progressing slightly but has scar tissue that is slowing progress see measurements.    Time  6    Period  Weeks    Status  On-going    Target Date  06/21/19      PT LONG TERM  GOAL #2   Title  Patient  to be properly fitted with appropriate compression garment to wear on daily basis for home management.    Baseline  Pt currently does not have compression that fits    Time  6    Period  Weeks    Status  On-going    Target Date  06/21/19      PT LONG TERM GOAL #3   Title  Pt will improve A/ROM of the L shoulder to 120 degrees flexion/abduction within 6 weeks in order to decrease risk for immobility.    Baseline  100 degrees flexion/70 degrees abduction    Time  6    Period  Weeks    Status  On-going    Target Date  06/21/19      PT LONG TERM GOAL #4   Title  Pt will report 50% improvement in pain/heaviness in the LUE within 4 weeks in order to demonstrate an improvementin quality of life.    Baseline  pt reports no significant improvement at this time.    Time  6    Period  Weeks    Status  On-going    Target Date  06/21/19      PT LONG TERM GOAL #5   Title  Pt will improve LLIS to 30% or less within 4 weeks in order to demonstrate objective improvement in functional activitie.            Plan - 05/08/19 1458    Clinical Impression Statement  Pt presents today with continued decrease in circumferential measurements. She continues with significant tightness in the fascia along the anterior chest/lateral trunk into the L axilla and the medial L brachium; She continues with moderate improvement by end of session today. Pt was wrapped with 4 short stretch bandgaes from hand to axilla with finger wraps to prevent finger swelling. She continues with decreased ROM in the L shoulder due to pain in the L shoulder with ROM that decreases with repetitions. Pt will benefit from current POC at htis time.    Personal Factors and Comorbidities  Comorbidity 2    Comorbidities  Bil radiation due to Bil breast cancer and lumpectomy, 15 lymph node removal on the L    Rehab Potential  Good    PT Frequency  3x / week    PT Duration  6 weeks    PT Treatment/Interventions   Therapeutic activities;Therapeutic exercise;Iontophoresis 80m/ml Dexamethasone;Patient/family education;Manual techniques;Neuromuscular re-education    PT Next Visit Plan  able to get on vest? continue with myofascial release, did she get compression, Continue CDT including wrapping and MLD and myofascial release. assess breast wrap    PT Home Exercise Plan  Access Code: [email or web address]   Recommended Other Services  apporpriate garment possibly niht garment vs. heavier compression and pum pplus compression bra. sign up with [employer]? ?    Consulted and Agree with Plan of Care  Patient       Patient will benefit from skilled therapeutic intervention in order to improve the following deficits and impairments:  Decreased range of motion, Pain, Increased edema  Visit Diagnosis: History of ductal carcinoma in situ (DCIS) of breast  Lymphedema, not elsewhere classified  Left shoulder pain, unspecified chronicity  Stiffness of left shoulder, not elsewhere classified     Problem List Patient Active Problem List   Diagnosis Date Noted  . Pes anserine bursitis 03/31/2019  . HTN (hypertension) 02/10/2019  . Hyperglycemia 06/21/2018  . Left carpal tunnel syndrome 02/22/2018  . Rotator cuff arthropathy  of left shoulder 09/20/2017  . Cervical radiculitis 04/09/2017  . Left shoulder pain 04/09/2017  . Dyspnea on exertion 05/14/2016  . 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 Lymphedema of left arm 01/14/2016  . Left arm swelling 12/25/2015  . SVT (supraventricular tachycardia) ([place])   . Left-sided carotid artery disease ([place]) 03/08/2015  . Dizziness 01/24/2015  . Urinary frequency 01/13/2015  . Dizziness and giddiness 01/09/2015  . Gallstones 02/21/2013  . Breast cancer of upper-inner quadrant of right female breast ([place]) 11/28/2012  . Muscle cramping 09/12/2012  . Right hip pain 09/12/2012  . Lower back pain 09/12/2012  . COPD GOLD I    . Hx of radiation  therapy   . Right shoulder pain 09/14/2011  . Preventative health care 07/29/2010  . Skin lesion of left leg 07/29/2010  . Paresthesia 07/29/2010  . GERD 03/28/2010  . CONSTIPATION 03/28/2010  . Osteoarthrosis, 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  . Anxiety state 05/03/2009  . Hyperlipidemia 06/14/2007  . FATIGUE 06/14/2007  . 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 DIVERTICULOSIS, COLON 12/17/2006  . Cough 12/17/2006  . IRRITABLE BOWEL SYNDROME, HX OF 12/17/2006    Stacey [name], PT 05/08/2019, 5:27 PM  [place], [place], [address] Phone: [phone]   Fax:  [phone]  Name: Stacey [name] MRN: [id number] Date of Birth: November 17, 1940

## 2019-05-09 ENCOUNTER — Inpatient Hospital Stay: Payer: Medicare Other | Attending: Adult Health

## 2019-05-09 ENCOUNTER — Encounter (HOSPITAL_COMMUNITY): Payer: Self-pay

## 2019-05-09 ENCOUNTER — Ambulatory Visit (HOSPITAL_COMMUNITY)
Admission: RE | Admit: 2019-05-09 | Discharge: 2019-05-09 | Disposition: A | Discharge status: Home / Self Care | Payer: Medicare Other | Source: Ambulatory Visit | Attending: Oncology | Admitting: Oncology

## 2019-05-09 ENCOUNTER — Other Ambulatory Visit: Payer: Self-pay

## 2019-05-09 DIAGNOSIS — Z791 Long term (current) use of non-steroidal anti-inflammatories (NSAID): Secondary | ICD-10-CM | POA: Insufficient documentation

## 2019-05-09 DIAGNOSIS — Z9221 Personal history of antineoplastic chemotherapy: Secondary | ICD-10-CM | POA: Diagnosis not present

## 2019-05-09 DIAGNOSIS — R222 Localized swelling, mass and lump, trunk: Secondary | ICD-10-CM | POA: Insufficient documentation

## 2019-05-09 DIAGNOSIS — K449 Diaphragmatic hernia without obstruction or gangrene: Secondary | ICD-10-CM | POA: Insufficient documentation

## 2019-05-09 DIAGNOSIS — Z923 Personal history of irradiation: Secondary | ICD-10-CM | POA: Insufficient documentation

## 2019-05-09 DIAGNOSIS — Z7982 Long term (current) use of aspirin: Secondary | ICD-10-CM | POA: Insufficient documentation

## 2019-05-09 DIAGNOSIS — I890 Lymphedema, not elsewhere classified: Secondary | ICD-10-CM | POA: Insufficient documentation

## 2019-05-09 DIAGNOSIS — J449 Chronic obstructive pulmonary disease, unspecified: Secondary | ICD-10-CM | POA: Insufficient documentation

## 2019-05-09 DIAGNOSIS — Z803 Family history of malignant neoplasm of breast: Secondary | ICD-10-CM | POA: Insufficient documentation

## 2019-05-09 DIAGNOSIS — K7689 Other specified diseases of liver: Secondary | ICD-10-CM | POA: Insufficient documentation

## 2019-05-09 DIAGNOSIS — Z79811 Long term (current) use of aromatase inhibitors: Secondary | ICD-10-CM | POA: Insufficient documentation

## 2019-05-09 DIAGNOSIS — I471 Supraventricular tachycardia: Secondary | ICD-10-CM | POA: Insufficient documentation

## 2019-05-09 DIAGNOSIS — M8580 Other specified disorders of bone density and structure, unspecified site: Secondary | ICD-10-CM | POA: Insufficient documentation

## 2019-05-09 DIAGNOSIS — E785 Hyperlipidemia, unspecified: Secondary | ICD-10-CM | POA: Diagnosis not present

## 2019-05-09 DIAGNOSIS — K8020 Calculus of gallbladder without cholecystitis without obstruction: Secondary | ICD-10-CM | POA: Diagnosis not present

## 2019-05-09 DIAGNOSIS — K219 Gastro-esophageal reflux disease without esophagitis: Secondary | ICD-10-CM | POA: Insufficient documentation

## 2019-05-09 DIAGNOSIS — C50211 Malignant neoplasm of upper-inner quadrant of right female breast: Secondary | ICD-10-CM | POA: Diagnosis not present

## 2019-05-09 DIAGNOSIS — Z170 Estrogen receptor positive status [ER+]: Secondary | ICD-10-CM | POA: Diagnosis not present

## 2019-05-09 DIAGNOSIS — M25512 Pain in left shoulder: Secondary | ICD-10-CM | POA: Diagnosis not present

## 2019-05-09 DIAGNOSIS — D649 Anemia, unspecified: Secondary | ICD-10-CM | POA: Insufficient documentation

## 2019-05-09 DIAGNOSIS — Z87891 Personal history of nicotine dependence: Secondary | ICD-10-CM | POA: Diagnosis not present

## 2019-05-09 DIAGNOSIS — Z853 Personal history of malignant neoplasm of breast: Secondary | ICD-10-CM | POA: Diagnosis not present

## 2019-05-09 DIAGNOSIS — C7951 Secondary malignant neoplasm of bone: Secondary | ICD-10-CM | POA: Diagnosis not present

## 2019-05-09 DIAGNOSIS — C50912 Malignant neoplasm of unspecified site of left female breast: Secondary | ICD-10-CM | POA: Diagnosis not present

## 2019-05-09 DIAGNOSIS — C7989 Secondary malignant neoplasm of other specified sites: Secondary | ICD-10-CM | POA: Insufficient documentation

## 2019-05-09 DIAGNOSIS — Z801 Family history of malignant neoplasm of trachea, bronchus and lung: Secondary | ICD-10-CM | POA: Insufficient documentation

## 2019-05-09 DIAGNOSIS — R918 Other nonspecific abnormal finding of lung field: Secondary | ICD-10-CM | POA: Diagnosis not present

## 2019-05-09 DIAGNOSIS — I700 Atherosclerosis of aorta: Secondary | ICD-10-CM | POA: Insufficient documentation

## 2019-05-09 DIAGNOSIS — M1990 Unspecified osteoarthritis, unspecified site: Secondary | ICD-10-CM | POA: Diagnosis not present

## 2019-05-09 DIAGNOSIS — D3501 Benign neoplasm of right adrenal gland: Secondary | ICD-10-CM | POA: Diagnosis not present

## 2019-05-09 LAB — CMP (CANCER CENTER ONLY)
ALT: 13 U/L (ref 0–44)
AST: 23 U/L (ref 15–41)
Albumin: 3.5 g/dL (ref 3.5–5.0)
Alkaline Phosphatase: 61 U/L (ref 38–126)
Anion gap: 11 (ref 5–15)
BUN: 18 mg/dL (ref 8–23)
CO2: 24 mmol/L (ref 22–32)
Calcium: 8.6 mg/dL — ABNORMAL LOW (ref 8.9–10.3)
Chloride: 109 mmol/L (ref 98–111)
Creatinine: 1.02 mg/dL — ABNORMAL HIGH (ref 0.44–1.00)
GFR, Est AFR Am: >60 mL/min (ref >60)
GFR, Estimated: 52 mL/min — ABNORMAL LOW (ref >60)
Glucose, Bld: 107 mg/dL — ABNORMAL HIGH (ref 70–99)
Potassium: 3.6 mmol/L (ref 3.5–5.1)
Sodium: 144 mmol/L (ref 135–145)
Total Bilirubin: 0.4 mg/dL (ref 0.3–1.2)
Total Protein: 6.9 g/dL (ref 6.5–8.1)

## 2019-05-09 LAB — CBC WITH DIFFERENTIAL (CANCER CENTER ONLY)
Abs Immature Granulocytes: 0.02 10*3/uL (ref 0.00–0.07)
Basophils Absolute: 0 10*3/uL (ref 0.0–0.1)
Basophils Relative: 1 %
Eosinophils Absolute: 0.2 10*3/uL (ref 0.0–0.5)
Eosinophils Relative: 3 %
HCT: 34.9 % — ABNORMAL LOW (ref 36.0–46.0)
Hemoglobin: 11.5 g/dL — ABNORMAL LOW (ref 12.0–15.0)
Immature Granulocytes: 0 %
Lymphocytes Relative: 18 %
Lymphs Abs: 1.4 10*3/uL (ref 0.7–4.0)
MCH: 28.3 pg (ref 26.0–34.0)
MCHC: 33 g/dL (ref 30.0–36.0)
MCV: 86 fL (ref 80.0–100.0)
Monocytes Absolute: 0.7 10*3/uL (ref 0.1–1.0)
Monocytes Relative: 9 %
Neutro Abs: 5.4 10*3/uL (ref 1.7–7.7)
Neutrophils Relative %: 69 %
Platelet Count: 290 10*3/uL (ref 150–400)
RBC: 4.06 MIL/uL (ref 3.87–5.11)
RDW: 14.5 % (ref 11.5–15.5)
WBC Count: 7.8 10*3/uL (ref 4.0–10.5)
nRBC: 0 % (ref 0.0–0.2)

## 2019-05-09 MED ORDER — SODIUM CHLORIDE (PF) 0.9 % IJ SOLN
INTRAMUSCULAR | Status: AC
  Filled 2019-05-09: qty 50

## 2019-05-09 MED ORDER — IOHEXOL 300 MG/ML  SOLN
75.0000 mL | Freq: Once | INTRAMUSCULAR | Status: AC | PRN
  Administered 2019-05-09: 75 mL via INTRAVENOUS

## 2019-05-10 ENCOUNTER — Ambulatory Visit: Payer: Medicare Other

## 2019-05-10 DIAGNOSIS — Z86000 Personal history of in-situ neoplasm of breast: Secondary | ICD-10-CM | POA: Diagnosis not present

## 2019-05-10 DIAGNOSIS — M25612 Stiffness of left shoulder, not elsewhere classified: Secondary | ICD-10-CM | POA: Diagnosis not present

## 2019-05-10 DIAGNOSIS — M25512 Pain in left shoulder: Secondary | ICD-10-CM | POA: Diagnosis not present

## 2019-05-10 DIAGNOSIS — I890 Lymphedema, not elsewhere classified: Secondary | ICD-10-CM

## 2019-05-10 NOTE — Therapy (Signed)
[place], [place], [address] Phone: [phone]   Fax:  [phone]  Physical Therapy Treatment  Patient Details  Name: Stacey [name] MRN: [id number] Date of Birth: 1940/10/01 Referring Provider (PT): Lurline Del MD   Encounter Date: 05/10/2019  PT End of Session - 05/10/19 1400    Visit Number  15    Number of Visits  31    Date for PT Re-Evaluation  06/23/19    PT Start Time  2458    PT Stop Time  1345    PT Time Calculation (min)  40 min    Activity Tolerance  Patient tolerated treatment well    Behavior During Therapy  [facility] for tasks assessed/performed       Past Medical History:  Diagnosis Date  . ANEMIA-NOS   . ANXIETY   . Breast cancer ([place]) 1997 L, 2012 R   s/p chemo/xrt  . COPD    resolved  . DIVERTICULOSIS, COLON 2008  . Dizziness   . GERD   . Hx of radiation therapy 10/19/11 -12/03/11   right breast  . HYPERLIPIDEMIA   . IRRITABLE BOWEL SYNDROME, HX OF   . Left-sided carotid artery disease (HCC)    moderate left ICA stenosis  . OSTEOARTHRITIS, HAND   . PSVT (paroxysmal supraventricular tachycardia) (HCC)    symptomatic on event monitor    Past Surgical History:  Procedure Laterality Date  . ABDOMINAL HYSTERECTOMY    . APPENDECTOMY    . BREAST BIOPSY  11/14/10    r breast: inv, insitu mammary carcinoma w/calcif, er/pr +, her2 -  . BREAST SURGERY     lumpectomy  . CATARACT EXTRACTION     both eyes  . ELECTROPHYSIOLOGIC STUDY N/A 05/10/2015   Procedure: SVT Ablation;  Surgeon: Will Meredith Leeds, MD;  Location: [place] [facility];  Service: Cardiovascular;  Laterality: N/A;  . HERNIA REPAIR    . inguinal herniorrhapy  1984   left  . rectal fissure repair    . s/p benign breast biopsy  2003   right  . s/p left foot surgury  2009  . 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 spiral fx left foot  2008   no surgury  . TMJ ARTHROPLASTY  1989  . [place]- to remove scar tissue growth   . TONSILLECTOMY      There were no vitals filed for this visit.  Subjective Assessment - 05/10/19 1356    Subjective  Pt states she took her wrap off about 10 am she wants her thumb wrapped a little lower to see if this helps keep her wraps on better becuase the thumb came off later on Monday after she was wrapped in physical therapy    Pertinent History  Hx breast cancer with 15 lymph node removal on the L and Bil lumpectomy. Lumpectomy on the L in 1997and the R in 2012. Radiation Bil    Patient Stated Goals  I want to take care of the swelling in my arm and learn what to do about it.    Currently in Pain?  No/denies                  Outpatient Re[facility] from [facility]/28/[facility]  Lymphedema Life Impact Scale Total Score  45.59 %           [facility] Adult PT Treatment/Exercise - 05/10/19  0001      Manual Therapy   Manual Therapy  Manual Lymphatic Drainage (MLD);Compression Bandaging    Manual Lymphatic Drainage (MLD)  In supine: short neck, swimming in the terminus, Bil axillary nodes, L inguinal nodes, L axillo-inguinal anastomosis, L shoulder, Lateral brachium, medial to lateral brachium, lateral brachium, antecubital fossa, all surfaces of the antebrachium, dorsum of the hand, reworked all surfaces then deep abdominals.     Compression Bandaging  Pt was wrapped with layered compression wrap using 4 short stretch bandages, TG soft as a base layer, 3 artiflex and 2 inch elastomull folded for digits 1-3             PT Education - 05/10/19 1359    Education Details  Pt states she has not tried on her compression cami yet she will try it on before her Friday appointment and states she remembers education on how to don.    Person(s) Educated  Patient    Methods  Explanation    Comprehension  Verbalized understanding       PT Short Term Goals - 05/03/19 1518      PT SHORT TERM GOAL #1   Title   Pt will be independent with HEP and MLD within 2 weeks in order to decrease improve autonomy of care.    Baseline  Pt currently is not performing MLD at home. and doing exercises occasionally    Time  3    Period  Weeks    Status  On-going    Target Date  05/31/19        PT Long Term Goals - 05/03/19 1519      PT LONG TERM GOAL #1   Title  Patient will have reduction of LUE limb girth by 3 cm or greater in the proximal and distal brachium/antebrachium within 4 weeks in order to demonstrate decrease edema.    Baseline  pt is progressing slightly but has scar tissue that is slowing progress see measurements.    Time  6    Period  Weeks    Status  On-going    Target Date  06/21/19      PT LONG TERM GOAL #2   Title  Patient to be properly fitted with appropriate compression garment to wear on daily basis for home management.    Baseline  Pt currently does not have compression that fits    Time  6    Period  Weeks    Status  On-going    Target Date  06/21/19      PT LONG TERM GOAL #3   Title  Pt will improve A/ROM of the L shoulder to 120 degrees flexion/abduction within 6 weeks in order to decrease risk for immobility.    Baseline  100 degrees flexion/70 degrees abduction    Time  6    Period  Weeks    Status  On-going    Target Date  06/21/19      PT LONG TERM GOAL #4   Title  Pt will report 50% improvement in pain/heaviness in the LUE within 4 weeks in order to demonstrate an improvementin quality of life.    Baseline  pt reports no significant improvement at this time.    Time  6    Period  Weeks    Status  On-going    Target Date  06/21/19      PT LONG TERM GOAL #5   Title  Pt will improve LLIS to 30%  or less within 4 weeks in order to demonstrate objective improvement in functional activitie.            Plan - 05/10/19 1401    Clinical Impression Statement  Pt presents to physical therapy stating that she needs to be at an appointment by 2 pm and would like to  leave by 2:45. She presents with softening of the LUE in the forearm and in the brachium with less edema noted in her L breast. Due to time restrictions no myofascial release was performed today. MLD was performed for the L breast and LUE with softening and creping demonstrating fluid flow out of the LUE. Pt was wrapped with short stretch bandages; wrapping digits 1-3 due to these are the fingers that swell. Pt will benefit from continued POC at this time.    Personal Factors and Comorbidities  Comorbidity 2    Comorbidities  Bil radiation due to Bil breast cancer and lumpectomy, 15 lymph node removal on the L    Rehab Potential  Good    PT Frequency  3x / week    PT Duration  6 weeks    PT Treatment/Interventions  Therapeutic activities;Therapeutic exercise;Iontophoresis 36m/ml Dexamethasone;Patient/family education;Manual techniques;Neuromuscular re-education    PT Next Visit Plan  able to get on vest? continue with myofascial release, did she get compression, Continue CDT including wrapping and MLD and myofascial release. assess breast wrap    PT Home Exercise Plan  Access Code: [email or web address]   Consulted and Agree with Plan of Care  Patient       Patient will benefit from skilled therapeutic intervention in order to improve the following deficits and impairments:  Decreased range of motion, Pain, Increased edema  Visit Diagnosis: History of ductal carcinoma in situ (DCIS) of breast  Lymphedema, not elsewhere classified  Left shoulder pain, unspecified chronicity  Stiffness of left shoulder, not elsewhere classified     Problem List Patient Active Problem List   Diagnosis Date Noted  . Pes anserine bursitis 03/31/2019  . HTN (hypertension) 02/10/2019  . Hyperglycemia 06/21/2018  . Left carpal tunnel syndrome 02/22/2018  . Rotator cuff arthropathy of left shoulder 09/20/2017  . Cervical radiculitis 04/09/2017  . Left shoulder pain 04/09/2017  . Dyspnea on exertion 05/14/2016  .  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 Lymphedema of left arm 01/14/2016  . Left arm swelling 12/25/2015  . [place]) [place]   . [place] artery disease ([place] 03/08/2015  . Dizziness 01/24/2015  . Urinary frequency 01/13/2015  . Dizziness and giddiness 01/09/2015  . Gallstones 02/21/2013  . Breast cancer of upper[place]inner quadrant of right [place] breast [place] 11/28/2012  . Muscle cramping 09/12/2012  . Right hip pain 09/12/2012  . Lower back pain 09/12/2012  . COPD GOLD I    . Hx of radiation therapy   . Right shoulder pain 09/14/2011  . Preventative health care 07/29/2010  . Skin lesion of left leg 07/29/2010  . Paresthesia 07/29/2010  . GERD 03/28/2010  . CONSTIPATION 03/28/2010  . Osteoarthrosis, 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  . Anxiety state 05/03/2009  . Hyperlipidemia 06/14/2007  . FATIGUE 06/14/2007  . 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 DIVERTICULOSIS, COLON 12/17/2006  . Cough 12/17/2006  . IRRITABLE BOWEL SYNDROME, HX OF 12/17/2006    CAnder Purpura PT 05/10/2019, 5:23 PM  [place] [address]: [phone]  Fax:  [phone] Name:  Stacey [name] MRN: [id number] Date of Birth: 07/28/1940

## 2019-05-12 ENCOUNTER — Other Ambulatory Visit: Payer: Self-pay

## 2019-05-12 ENCOUNTER — Other Ambulatory Visit: Payer: Self-pay | Admitting: Oncology

## 2019-05-12 ENCOUNTER — Ambulatory Visit: Payer: Medicare Other

## 2019-05-12 DIAGNOSIS — I890 Lymphedema, not elsewhere classified: Secondary | ICD-10-CM | POA: Diagnosis not present

## 2019-05-12 DIAGNOSIS — Z86000 Personal history of in-situ neoplasm of breast: Secondary | ICD-10-CM

## 2019-05-12 DIAGNOSIS — M25512 Pain in left shoulder: Secondary | ICD-10-CM

## 2019-05-12 DIAGNOSIS — M25612 Stiffness of left shoulder, not elsewhere classified: Secondary | ICD-10-CM | POA: Diagnosis not present

## 2019-05-12 NOTE — Therapy (Signed)
[place], [place], [address] Phone: [phone]   Fax:  [phone]  Physical Therapy Treatment  Patient Details  Name: Stacey [name] MRN: [id number] Date of Birth: 22-Jul-1940 Referring Provider (PT): Lurline Del MD   Encounter Date: 05/12/2019  PT End of Session - 05/12/19 1114    Visit Number  16    Number of Visits  31    Date for PT Re-Evaluation  06/23/19    PT Start Time  1112    PT Stop Time  1212    PT Time Calculation (min)  60 min    Activity Tolerance  Patient tolerated treatment well    Behavior During Therapy  [facility] for tasks assessed/performed       Past Medical History:  Diagnosis Date  . ANEMIA-NOS   . ANXIETY   . Breast cancer ([place]) 1997 L, 2012 R   s/p chemo/xrt  . COPD    resolved  . DIVERTICULOSIS, COLON 2008  . Dizziness   . GERD   . Hx of radiation therapy 10/19/11 -12/03/11   right breast  . HYPERLIPIDEMIA   . IRRITABLE BOWEL SYNDROME, HX OF   . Left-sided carotid artery disease (HCC)    moderate left ICA stenosis  . OSTEOARTHRITIS, HAND   . PSVT (paroxysmal supraventricular tachycardia) (HCC)    symptomatic on event monitor    Past Surgical History:  Procedure Laterality Date  . ABDOMINAL HYSTERECTOMY    . APPENDECTOMY    . BREAST BIOPSY  11/14/10    r breast: inv, insitu mammary carcinoma w/calcif, er/pr +, her2 -  . BREAST SURGERY     lumpectomy  . CATARACT EXTRACTION     both eyes  . ELECTROPHYSIOLOGIC STUDY N/A 05/10/2015   Procedure: SVT Ablation;  Surgeon: Will Meredith Leeds, MD;  Location: [place] [facility];  Service: Cardiovascular;  Laterality: N/A;  . HERNIA REPAIR    . inguinal herniorrhapy  1984   left  . rectal fissure repair    . s/p benign breast biopsy  2003   right  . s/p left foot surgury  2009  . 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 spiral fx left foot  2008   no surgury  . TMJ ARTHROPLASTY  1989  . [place]- to remove scar tissue growth   . TONSILLECTOMY      There were no vitals filed for this visit.  Subjective Assessment - 05/12/19 1114    Pertinent History  Hx breast cancer with 15 lymph node removal on the L and Bil lumpectomy. Lumpectomy on the L in 1997and the R in 2012. Radiation Bil    Patient Stated Goals  I want to take care of the swelling in my arm and learn what to do about it.    Currently in Pain?  Yes    Pain Score  6     Pain Location  Hand    Pain Orientation  Left    Pain Descriptors / Indicators  Aching            LYMPHEDEMA/ONCOLOGY QUESTIONNAIRE - 05/12/19 1115      Left Upper Extremity Lymphedema   15 cm Proximal to Olecranon Process  35.6 cm    10 cm Proximal to Olecranon Process  36.2 cm    Olecranon Process  32.2 cm    15 cm Proximal to Ulnar Styloid Process  30 cm  10 cm Proximal to Ulnar Styloid Process  26.2 cm    Just Proximal to Ulnar Styloid Process  19.9 cm    Across Hand at [employer]  19.9 cm    At [place] of 2nd Digit  7.2 cm           Outpatient Re[facility] from [facility]  Lymphedema Life Impact Scale Total Score  45.59 %           [facility] Adult PT Treatment/Exercise - 05/12/19 0001      Manual Therapy   Manual Therapy  Manual Lymphatic Drainage (MLD);Compression Bandaging;Soft tissue mobilization    Soft tissue mobilization  STM to the L pectoralis, supinator, wrist flexor/extensor and triceps with minimal improvement by end of session.     Manual Lymphatic Drainage (MLD)  In supine: short neck, swimming in the terminus, Bil axillary nodes, L inguinal nodes, L axillo-inguinal anastomosis, L shoulder, Lateral brachium, medial to lateral brachium, lateral brachium, antecubital fossa, all surfaces of the antebrachium, dorsum of the hand, reworked all surfaces then deep abdominals.     Compression Bandaging  Pt was wrapped with layered compression wrap using 4 short stretch  bandages, TG soft as a base layer, 3 artiflex and 2 inch elastomull folded for digits 1-3, 1/2 inch gram foam at the dorsum of the L hand and at the bend in the elbow and extra folded artiflex at the elbow due to pt reports rubbing in this area.              PT Education - 05/12/19 1211    Education Details  Pt educated to continue wearing her compression cami and discussed how it will decrease in stiffness the more she wears it which will make it easier to don.    Person(s) Educated  Patient    Methods  Explanation    Comprehension  Verbalized understanding       PT Short Term Goals - 05/03/19 1518      PT SHORT TERM GOAL #1   Title  Pt will be independent with HEP and MLD within 2 weeks in order to decrease improve autonomy of care.    Baseline  Pt currently is not performing MLD at home. and doing exercises occasionally    Time  3    Period  Weeks    Status  On-going    Target Date  05/31/19        PT Long Term Goals - 05/03/19 1519      PT LONG TERM GOAL #1   Title  Patient will have reduction of LUE limb girth by 3 cm or greater in the proximal and distal brachium/antebrachium within 4 weeks in order to demonstrate decrease edema.    Baseline  pt is progressing slightly but has scar tissue that is slowing progress see measurements.    Time  6    Period  Weeks    Status  On-going    Target Date  06/21/19      PT LONG TERM GOAL #2   Title  Patient to be properly fitted with appropriate compression garment to wear on daily basis for home management.    Baseline  Pt currently does not have compression that fits    Time  6    Period  Weeks    Status  On-going    Target Date  06/21/19      PT LONG TERM GOAL #3   Title  Pt  will improve A/ROM of the L shoulder to 120 degrees flexion/abduction within 6 weeks in order to decrease risk for immobility.    Baseline  100 degrees flexion/70 degrees abduction    Time  6    Period  Weeks    Status  On-going    Target Date   06/21/19      PT LONG TERM GOAL #4   Title  Pt will report 50% improvement in pain/heaviness in the LUE within 4 weeks in order to demonstrate an improvementin quality of life.    Baseline  pt reports no significant improvement at this time.    Time  6    Period  Weeks    Status  On-going    Target Date  06/21/19      PT LONG TERM GOAL #5   Title  Pt will improve LLIS to 30% or less within 4 weeks in order to demonstrate objective improvement in functional activitie.            Plan - 05/12/19 1114    Clinical Impression Statement  Pt presents to physical therapy this session with her compression cami vest. She demonstrates increased circumferential measurements from her initial evaluation and continues with increasing edema in her L breast. Pt has been compliant with over 4 weeks of conservative therapy including compression, elevation, exercise and MLD and continues with edema in her LUE and L breast. Pt needs a vasopneumatic pump that covers the chest wall due to edema in the breast secondary to breast cancer surgery but due to insurance she has not yet recieved the equipment necessary to improve her swelling. Pt is at risk for increased swelling, infection and decreased mobility without the needed equipment at home. STM was performed due to tightness noted in the L pectoralis muscle, triceps and wrist flexors/extensors. Significant improvement is noted in the myofascial tissue in the L axilla but continues to be tight; at this time ROM seems to be limited by muscular tightness. MLD was performed with creping present demonstrating fluid flow out of the area but this is not permanent. Pt was wrapped with short stretch bandages; wrapping digits 1-3, 1/2 inch gray foam at the elbow crease and over the dorsum of the hand. Pt will benefit from continued POC at this time.    Personal Factors and Comorbidities  Comorbidity 2    Comorbidities  Bil radiation due to Bil breast cancer and lumpectomy,  15 lymph node removal on the L    Stability/Clinical Decision Making  Stable/Uncomplicated    Rehab Potential  Good    PT Frequency  3x / week    PT Duration  6 weeks    PT Treatment/Interventions  Therapeutic activities;Therapeutic exercise;Iontophoresis 12m/ml Dexamethasone;Patient/family education;Manual techniques;Neuromuscular re-education    PT Next Visit Plan  how is vest? continue with myofascial release, did she get compression, Continue CDT including wrapping and MLD and myofascial release. assess breast wrap    PT Home Exercise Plan  Access Code: [email or web address]   Consulted and Agree with Plan of Care  Patient       Patient will benefit from skilled therapeutic intervention in order to improve the following deficits and impairments:  Decreased range of motion, Pain, Increased edema  Visit Diagnosis: History of ductal carcinoma in situ (DCIS) of breast  Lymphedema, not elsewhere classified  Left shoulder pain, unspecified chronicity  Stiffness of left shoulder, not elsewhere classified     Problem List Patient Active Problem List   Diagnosis  Date Noted  . Pes anserine bursitis 03/31/2019  . HTN (hypertension) 02/10/2019  . Hyperglycemia 06/21/2018  . Left carpal tunnel syndrome 02/22/2018  . Rotator cuff arthropathy of left shoulder 09/20/2017  . Cervical radiculitis 04/09/2017  . Left shoulder pain 04/09/2017  . Dyspnea on exertion 05/14/2016  . 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 Lymphedema of left arm 01/14/2016  . Left arm swelling 12/25/2015  . SVT (supraventricular tachycardia) ([place])   . Left-sided carotid artery disease ([place]) 03/08/2015  . Dizziness 01/24/2015  . Urinary frequency 01/13/2015  . Dizziness and giddiness 01/09/2015  . Gallstones 02/21/2013  . Breast cancer of upper-inner quadrant of right female breast ([place]) 11/28/2012  . Muscle cramping 09/12/2012  . Right hip pain 09/12/2012  . Lower back pain 09/12/2012  .  COPD GOLD I    . Hx of radiation therapy   . Right shoulder pain 09/14/2011  . Preventative health care 07/29/2010  . Skin lesion of left leg 07/29/2010  . Paresthesia 07/29/2010  . GERD 03/28/2010  . CONSTIPATION 03/28/2010  . Osteoarthrosis, 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  . Anxiety state 05/03/2009  . Hyperlipidemia 06/14/2007  . FATIGUE 06/14/2007  . 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 DIVERTICULOSIS, COLON 12/17/2006  . Cough 12/17/2006  . IRRITABLE BOWEL SYNDROME, HX OF 12/17/2006    Ander Purpura, PT 05/12/2019, 12:19 PM  [place], [place], [address] Phone: [phone]   Fax:  [phone]  Name: Stacey [name] MRN: [id number] Date of Birth: December 09, 1940

## 2019-05-12 NOTE — Progress Notes (Signed)
I called Stacey [name] and gave her the results of the CT of the chest.  We are going to need to biopsy the large subpectoral mass.

## 2019-05-15 ENCOUNTER — Other Ambulatory Visit: Payer: Self-pay | Admitting: Oncology

## 2019-05-15 DIAGNOSIS — C50912 Malignant neoplasm of unspecified site of left female breast: Secondary | ICD-10-CM | POA: Insufficient documentation

## 2019-05-15 DIAGNOSIS — C50919 Malignant neoplasm of unspecified site of unspecified female breast: Secondary | ICD-10-CM

## 2019-05-15 DIAGNOSIS — C50211 Malignant neoplasm of upper-inner quadrant of right female breast: Secondary | ICD-10-CM

## 2019-05-16 ENCOUNTER — Ambulatory Visit: Payer: Medicare Other

## 2019-05-16 ENCOUNTER — Encounter (HOSPITAL_COMMUNITY): Payer: Self-pay | Admitting: Radiology

## 2019-05-16 ENCOUNTER — Other Ambulatory Visit: Payer: Self-pay

## 2019-05-16 ENCOUNTER — Ambulatory Visit (INDEPENDENT_AMBULATORY_CARE_PROVIDER_SITE_OTHER): Payer: Medicare Other | Admitting: Family Medicine

## 2019-05-16 ENCOUNTER — Encounter: Payer: Self-pay | Admitting: Family Medicine

## 2019-05-16 DIAGNOSIS — Z86000 Personal history of in-situ neoplasm of breast: Secondary | ICD-10-CM | POA: Diagnosis not present

## 2019-05-16 DIAGNOSIS — I890 Lymphedema, not elsewhere classified: Secondary | ICD-10-CM

## 2019-05-16 DIAGNOSIS — M25612 Stiffness of left shoulder, not elsewhere classified: Secondary | ICD-10-CM

## 2019-05-16 DIAGNOSIS — M12812 Other specific arthropathies, not elsewhere classified, left shoulder: Secondary | ICD-10-CM

## 2019-05-16 DIAGNOSIS — M25512 Pain in left shoulder: Secondary | ICD-10-CM

## 2019-05-16 NOTE — Progress Notes (Signed)
[place] [address] [place] Phone: [phone] Subjective:   I Stacey [name] am serving as a Education administrator for Dr. Hulan Saas.  This visit occurred during the SARS-CoV-2 public health emergency.  Safety protocols were in place, including screening questions prior to the visit, additional usage of staff PPE, and extensive cleaning of exam room while observing appropriate contact time as indicated for disinfecting solutions.   I'm seeing this patient by the request  of:  Biagio Borg, MD  CC: Left arm pain  HYI:[id number]   03/30/2019 New problem.  Seems to be more of a pes anserine bursitis.  Topical anti-inflammatories prescribed.  Discussed compression sleeve, home exercises at work with Product/process development scientist.  Mild underlying arthritic changes could also be contributing.  Patient has many different comorbidities which makes treatment difficult in one of her many social determinants of health.  Patient will try conservative therapy worsening symptoms consider the possibility of formal physical therapy or potential injection.  05/16/2019 Stacey [name] is a 79 y.o. female coming in with complaint of left shoulder pain. Knee is better. Patient states she has mass on left chest wall under her breast. Going to PT for lymph edema and the therapy has caused shoulder pain. Patient wants left shoulder injection. Wearing compression sleeve. PT has stopped trying to push the lymph edema. Patient has been exercising. Thumb first and second fingers are numb. Under her armpit is tender. History of breast cancer. Patient states she has a lot of scar tissue on her left side.   Onset- Chronic  Location -  Duration-  Character- achy  Aggravating factors- flexion, ADL,  Reliving factors-  Therapies tried- aleve, exercise Severity-  6/10 at Its worse      Past Medical History:  Diagnosis Date  . ANEMIA-NOS   . ANXIETY   . Breast cancer ([place]) 1997 L, 2012 R     s/p chemo/xrt  . COPD    resolved  . DIVERTICULOSIS, COLON 2008  . Dizziness   . GERD   . Hx of radiation therapy 10/19/11 -12/03/11   right breast  . HYPERLIPIDEMIA   . IRRITABLE BOWEL SYNDROME, HX OF   . Left-sided carotid artery disease (HCC)    moderate left ICA stenosis  . OSTEOARTHRITIS, HAND   . PSVT (paroxysmal supraventricular tachycardia) (HCC)    symptomatic on event monitor   Past Surgical History:  Procedure Laterality Date  . ABDOMINAL HYSTERECTOMY    . APPENDECTOMY    . BREAST BIOPSY  11/14/10    r breast: inv, insitu mammary carcinoma w/calcif, er/pr +, her2 -  . BREAST SURGERY     lumpectomy  . CATARACT EXTRACTION     both eyes  . ELECTROPHYSIOLOGIC STUDY N/A 05/10/2015   Procedure: SVT Ablation;  Surgeon: Will Meredith Leeds, MD;  Location: [place] [facility];  Service: Cardiovascular;  Laterality: N/A;  . HERNIA REPAIR    . inguinal herniorrhapy  1984   left  . rectal fissure repair    . s/p benign breast biopsy  2003   right  . s/p left foot surgury  2009  . 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 spiral fx left foot  2008   no surgury  . TMJ ARTHROPLASTY  1989  . [place]- to remove scar tissue growth   . TONSILLECTOMY     Social History   Socioeconomic History  .  Marital status: Divorced    Spouse name: 2 Step-children  . Number of children: 2  . Years of education: Not on file  . Highest education level: Not on file  Occupational History  . Occupation: retired Banker  Tobacco Use  . Smoking status: Former Smoker    Packs/day: 1.00    Years: 54.00    Pack years: 54.00    Types: Cigarettes    Quit date: 01/03/2016    Years since quitting: 3.3  . Smokeless tobacco: Never Used  Substance and Sexual Activity  . Alcohol use: Yes    Comment: rare/ drinks socially  . Drug use: No  . Sexual activity: Never  Other Topics Concern  . Not on file  Social History Narrative   Patient gets no regular exercise   No  biological children   2 step children   Social Determinants of Health   Financial Resource Strain: Low Risk   . Difficulty of Paying Living Expenses: Not hard at all  Food Insecurity: No Food Insecurity  . Worried About Charity fundraiser in the Last Year: Never true  . Ran Out of Food in the Last Year: Never true  Transportation Needs: No Transportation Needs  . Lack of Transportation (Medical): No  . Lack of Transportation (Non-Medical): No  Physical Activity: Sufficiently Active  . Days of Exercise per Week: 6 days  . Minutes of Exercise per Session: 40 min  Stress: No Stress Concern Present  . Feeling of Stress : Not at all  Social Connections: Slightly Isolated  . Frequency of Communication with Friends and Family: More than three times a week  . Frequency of Social Gatherings with Friends and Family: More than three times a week  . Attends Religious Services: More than 4 times per year  . Active Member of Clubs or Organizations: Yes  . Attends Archivist Meetings: More than 4 times per year  . Marital Status: Divorced   Allergies  Allergen Reactions  . Bee Venom Swelling  . Clindamycin/Lincomycin Diarrhea and Nausea And Vomiting  . Codeine Nausea And Vomiting   Family History  Problem Relation Age of Onset  . Alcohol abuse Mother        ETOH dependence  . Hypertension Mother   . Stroke Mother   . Colon polyps Mother   . Diabetes Mother   . Pancreatic cancer Mother 8  . Uterine cancer Mother 18  . Lymphoma Brother        burkitts  . Lung cancer Paternal Uncle   . Lung cancer Maternal Grandmother 32       non-smoker  . Lung cancer Maternal Grandfather   . Breast cancer Cousin        maternal cousin, dx in her mid 50s  . Brain cancer Cousin        maternal cousin's son; dx in his 64s  . Testicular cancer Cousin        maternal cousin's son;   . Breast cancer Cousin        paternal cousin; dx in her 63s  . Breast cancer Cousin        1 maternal,  2 paternal  . Colon cancer Neg Hx   . Esophageal cancer Neg Hx   . Stomach cancer Neg Hx   . Rectal cancer Neg Hx      Current Outpatient Medications (Cardiovascular):  .  telmisartan (MICARDIS) 40 MG tablet, Take 1 tablet (40 mg total) by mouth daily.  Current Outpatient Medications (Respiratory):  .  diphenhydrAMINE (BENADRYL) 25 MG tablet, Take 25 mg by mouth every 6 (six) hours as needed (for bee stings).  .  fluticasone (FLONASE) 50 MCG/ACT nasal spray, Place 2 sprays into both nostrils daily. Marland Kitchen  loratadine (CLARITIN) 10 MG tablet, Take 10 mg by mouth daily. .  pseudoephedrine (SUDAFED) 30 MG tablet, Take 30 mg by mouth every 4 (four) hours as needed for congestion.  .  Tiotropium Bromide Monohydrate (SPIRIVA RESPIMAT) 2.5 MCG/ACT AERS, Inhale 2 puffs into the lungs daily.  Current Outpatient Medications (Analgesics):  .  aspirin 81 MG EC tablet, Take 81 mg by mouth daily.   .  meloxicam (MOBIC) 15 MG tablet, TAKE 1 TABLET(15 MG) BY MOUTH DAILY Annual appt due in June must see provider for future refills   Current Outpatient Medications (Other):  .  calcium-vitamin D (OSCAL WITH D) 500-200 MG-UNIT per tablet, Take 1 tablet by mouth daily. .  cholecalciferol (VITAMIN D) 1000 UNITS tablet, Take 1,000 Units by mouth daily.   .  clotrimazole-betamethasone (LOTRISONE) cream, Apply 1 application topically 2 (two) times daily as needed. .  diclofenac sodium (VOLTAREN) 1 % GEL, Apply 4 g topically 4 (four) times daily as needed. .  Multiple Vitamin (MULTIVITAMIN) capsule, Take 1 capsule by mouth daily.   Marland Kitchen  omeprazole (PRILOSEC) 20 MG capsule, TAKE 1 CAPSULE(20 MG) BY MOUTH DAILY .  polyethylene glycol (MIRALAX / GLYCOLAX) packet, take 1 packet once daily if needed for constipation .  zolpidem (AMBIEN) 10 MG tablet, TAKE 1 TABLET(10 MG) BY MOUTH AT BEDTIME AS NEEDED   Reviewed prior external information including notes and imaging from  primary care provider As well as notes that  were available from care everywhere and other healthcare systems.  Past medical history, social, surgical and family history all reviewed in electronic medical record.  No pertanent information unless stated regarding to the chief complaint.     Objective  Blood pressure 130/80, pulse (!) 103, height 5' 3.5" (1.613 m), weight 171 lb (77.6 kg), SpO2 94 %.   General: No apparent distress alert and oriented x3 mood and affect normal, dressed appropriately.  HEENT: Pupils equal, extraocular movements intact  Left arm does have swelling noted compared to the contralateral side.  Patient does have significant edema.  Positive impingement of the shoulder noted.   Impression and Recommendations:     This case required medical decision making of moderate complexity. The above documentation has been reviewed and is accurate and complete Lyndal Pulley, DO       Note: This dictation was prepared with Dragon dictation along with smaller phrase technology. Any transcriptional errors that result from this process are unintentional.

## 2019-05-16 NOTE — Therapy (Signed)
[place], [place], [address] Phone: [phone]   Fax:  [phone]  Physical Therapy Treatment  Patient Details  Name: ELNORE COSENS MRN: [id number] Date of Birth: Jan 26, 1941 Referring Provider (PT): Lurline Del MD   Encounter Date: 05/16/2019  PT End of Session - 05/16/19 1326    Visit Number  17    Number of Visits  31    Date for PT Re-Evaluation  06/23/19    PT Start Time  1104    PT Stop Time  1157    PT Time Calculation (min)  53 min    Activity Tolerance  Patient tolerated treatment well    Behavior During Therapy  [facility] for tasks assessed/performed       Past Medical History:  Diagnosis Date  . ANEMIA-NOS   . ANXIETY   . Breast cancer ([place]) 1997 L, 2012 R   s/p chemo/xrt  . COPD    resolved  . DIVERTICULOSIS, COLON 2008  . Dizziness   . GERD   . Hx of radiation therapy 10/19/11 -12/03/11   right breast  . HYPERLIPIDEMIA   . IRRITABLE BOWEL SYNDROME, HX OF   . Left-sided carotid artery disease (HCC)    moderate left ICA stenosis  . OSTEOARTHRITIS, HAND   . PSVT (paroxysmal supraventricular tachycardia) (HCC)    symptomatic on event monitor    Past Surgical History:  Procedure Laterality Date  . ABDOMINAL HYSTERECTOMY    . APPENDECTOMY    . BREAST BIOPSY  11/14/10    r breast: inv, insitu mammary carcinoma w/calcif, er/pr +, her2 -  . BREAST SURGERY     lumpectomy  . CATARACT EXTRACTION     both eyes  . ELECTROPHYSIOLOGIC STUDY N/A 05/10/2015   Procedure: SVT Ablation;  Surgeon: Will Meredith Leeds, MD;  Location: [facility];  Service: Cardiovascular;  Laterality: N/A;  . HERNIA REPAIR    . inguinal herniorrhapy  1984   left  . rectal fissure repair    . s/p benign breast biopsy  2003   right  . s/p left foot surgury  2009  . 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 spiral fx left foot  2008   no surgury  . TMJ ARTHROPLASTY  1989  . [place]- to remove scar tissue growth   . TONSILLECTOMY      There were no vitals filed for this visit.  Subjective Assessment - 05/16/19 1303    Subjective  Pt states that she got the results from her CT scan and was told there is a mass that needs to be biopsied. She is going in for her bipsy on 05/25/2019. She states that she took her wrap off today to take a shower and her arm was softer after removing the wrap but fills with fluid quickly. Pt reports that due to her COPD she is unable to wear compression cami.    Pertinent History  Hx breast cancer with 15 lymph node removal on the L and Bil lumpectomy. Lumpectomy on the L in 1997and the R in 2012. Radiation Bil    Patient Stated Goals  I want to take care of the swelling in my arm and learn what to do about it.    Currently in Pain?  No/denies    Pain Score  0-No pain  [facility]patient [facility]  Lymphedema Life Impact Scale Total Score  45.59 %           [facility] Adult PT Treatment/Exercise - 05/16/19 0001      Manual Therapy   Manual Therapy  Edema management    Edema Management  Pt was not wrapped this session tried Medium TG soft folded over 1/4 inch foam from wrist to axilla and then folded small TG soft at hand/wrist for edema management.              PT Education - 05/16/19 1305    Education Details  Discussed with patient the possibility that CT scan results could be why she is not seeing sufficient reduction in volume in her LUE. Discussed options including trying a compression sleeve that she will need to replace, wrapping and leaving the wrap on until her next visit, or trying TG soft with foam that she might be able to remove/replace, or learning how to self wrap in order to manage her edema until she gets her biopsy results. Pt was agreeable and would like to try TG soft and 1/4 inch foam initially to see if this isn't easier to wear  on a daily basis.    Person(s) Educated  Patient    Methods  Explanation    Comprehension  Verbalized understanding       PT Short Term Goals - 05/03/19 1518      PT SHORT TERM GOAL #1   Title  Pt will be independent with HEP and MLD within 2 weeks in order to decrease improve autonomy of care.    Baseline  Pt currently is not performing MLD at home. and doing exercises occasionally    Time  3    Period  Weeks    Status  On-going    Target Date  05/31/19        PT Long Term Goals - 05/03/19 1519      PT LONG TERM GOAL #1   Title  Patient will have reduction of LUE limb girth by 3 cm or greater in the proximal and distal brachium/antebrachium within 4 weeks in order to demonstrate decrease edema.    Baseline  pt is progressing slightly but has scar tissue that is slowing progress see measurements.    Time  6    Period  Weeks    Status  On-going    Target Date  06/21/19      PT LONG TERM GOAL #2   Title  Patient to be properly fitted with appropriate compression garment to wear on daily basis for home management.    Baseline  Pt currently does not have compression that fits    Time  6    Period  Weeks    Status  On-going    Target Date  06/21/19      PT LONG TERM GOAL #3   Title  Pt will improve A/ROM of the L shoulder to 120 degrees flexion/abduction within 6 weeks in order to decrease risk for immobility.    Baseline  100 degrees flexion/70 degrees abduction    Time  6    Period  Weeks    Status  On-going    Target Date  06/21/19      PT LONG TERM GOAL #4   Title  Pt will report 50% improvement in pain/heaviness in the LUE within 4 weeks in order to demonstrate an improvementin quality of life.  Baseline  pt reports no significant improvement at this time.    Time  6    Period  Weeks    Status  On-going    Target Date  06/21/19      PT LONG TERM GOAL #5   Title  Pt will improve LLIS to 30% or less within 4 weeks in order to demonstrate objective improvement in  functional activitie.            Plan - 05/16/19 1327    Clinical Impression Statement  Pt presents to physical therapy today with her wraps rolled in a bag. She is unable to don her compression cami due to co-morbidiy of COPD and will need alternative compression either sleep garment or zip up compression. Pt got results for CT scan back and was told that she needs to come in for a biopsy of sub pectoral mass. This could be the reason for poor fluid flow out of the LUE with complete decongestive therapy. Time was spent today discussing different options for compression with patient. This physical therapist and patient decided to try TG soft with foam this session for improved mobility and tolerance. Also, discussed with patient if this is not able to contain swelling then will try wrapping with pt wearing wrap from one appointment to the next wtih no time out of the wrap; pt states understanding. Pt will benefit from current POC at this time unless otherwise specified by MD.    Personal Factors and Comorbidities  Comorbidity 2    Comorbidities  Bil radiation due to Bil breast cancer and lumpectomy, 15 lymph node removal on the L    Stability/Clinical Decision Making  Stable/Uncomplicated    Rehab Potential  Good    PT Frequency  3x / week    PT Duration  6 weeks    PT Treatment/Interventions  Therapeutic activities;Therapeutic exercise;Iontophoresis 65m/ml Dexamethasone;Patient/family education;Manual techniques;Neuromuscular re-education    PT Next Visit Plan  how is vest? continue with myofascial release, did she get compression, Continue CDT including wrapping and MLD and myofascial release. assess breast wrap    PT Home Exercise Plan  Access Code: [email or web address]   Consulted and Agree with Plan of Care  Patient       Patient will benefit from skilled therapeutic intervention in order to improve the following deficits and impairments:  Decreased range of motion, Pain, Increased edema  Visit  Diagnosis: History of ductal carcinoma in situ (DCIS) of breast  Lymphedema, not elsewhere classified  Left shoulder pain, unspecified chronicity  Stiffness of left shoulder, not elsewhere classified     Problem List Patient Active Problem List   Diagnosis Date Noted  . Recurrent breast cancer ([place] 05/15/2019  . Pes anserine bursitis 03/31/2019  . HTN (hypertension) 02/10/2019  . Hyperglycemia 06/21/2018  . Left carpal tunnel syndrome 02/22/2018  . Rotator cuff arthropathy of left shoulder 09/20/2017  . Cervical radiculitis 04/09/2017  . Left shoulder pain 04/09/2017  . Dyspnea on exertion 05/14/2016  . 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 Lymphedema of left arm 01/14/2016  . Left arm swelling 12/25/2015  . SVT (supraventricular tachycardia) ([place]   . Left-sided carotid artery disease ([place] 03/08/2015  . Dizziness 01/24/2015  . Urinary frequency 01/13/2015  . Dizziness and giddiness 01/09/2015  . Gallstones 02/21/2013  . Breast cancer of upper-inner quadrant of right [place] breast [place] 11/28/2012  . Muscle cramping 09/12/2012  . Right hip pain 09/12/2012  . Lower back  pain 09/12/2012  . COPD GOLD I    . Hx of radiation therapy   . Right shoulder pain 09/14/2011  . Preventative health care 07/29/2010  . Skin lesion of left leg 07/29/2010  . Paresthesia 07/29/2010  . GERD 03/28/2010  . CONSTIPATION 03/28/2010  . Osteoarthrosis, 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  . Anxiety state 05/03/2009  . Hyperlipidemia 06/14/2007  . FATIGUE 06/14/2007  . 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 DIVERTICULOSIS, COLON 12/17/2006  . Cough 12/17/2006  . IRRITABLE BOWEL SYNDROME, HX OF 12/17/2006    Ander Purpura, PT 05/16/2019, 1:53 PM  [place], [place], [address] Phone: [phone]   Fax:  [phone]  Name: LABRITTANY WECHTER MRN: [id number] Date of Birth: 23-Oct-1940

## 2019-05-16 NOTE — Patient Instructions (Addendum)
Good to see you Once you have biopsy give me a call and we can work you in

## 2019-05-16 NOTE — Progress Notes (Signed)
Stacey [name] Female, 79 y.o., 01-05-1941 MRN:  [id number] Phone:  [phone] [name]) PCP:  Biagio Borg, MD Primary Cvg:  Medicare/Medicare Part A And B Next Appt With Radiology ([facility] 2) 05/25/2019 at 1:00 PM  RE: CT Biopsy Received: Today Message Contents  Markus Daft, MD  Garth Bigness D  Schedule in [place] for biopsy of left subpectoral lesion. [place] is needed to see the vessels.    [place]       Previous Messages   ----- Message -----  From: Garth Bigness D  Sent: 05/16/2019  8:20 AM EDT  To: Ir Procedure Requests  Subject: CT Biopsy                     Procedure: CT Biopsy   Reason:  Malignant neoplasm of upper-inner quadrant of right female breast, unspecified estrogen receptor status, Recurrent malignant neoplasm of breast, unspecified laterality, please biopsy large subpectoral mass, not amenable to US biopsy (discussed with Dr Luan Pulling)   History: CT in computer   Provider: Chauncey Cruel   Provider Contact: [phone]

## 2019-05-16 NOTE — Assessment & Plan Note (Signed)
Known rotator cuff arthropathy but want to hold on any type of injection secondary to patient having her biopsy here in the next 9 days.  After patient has the biopsy and depending on the management of this I would then suggest we can do an injection before some other invasive procedures are likely to be coming.  Patient will call after she discusses with her oncologist.

## 2019-05-17 ENCOUNTER — Inpatient Hospital Stay (HOSPITAL_BASED_OUTPATIENT_CLINIC_OR_DEPARTMENT_OTHER): Payer: Medicare Other | Admitting: Oncology

## 2019-05-17 DIAGNOSIS — C50211 Malignant neoplasm of upper-inner quadrant of right female breast: Secondary | ICD-10-CM | POA: Diagnosis not present

## 2019-05-17 DIAGNOSIS — C50911 Malignant neoplasm of unspecified site of right female breast: Secondary | ICD-10-CM | POA: Diagnosis not present

## 2019-05-17 DIAGNOSIS — R9389 Abnormal findings on diagnostic imaging of other specified body structures: Secondary | ICD-10-CM | POA: Diagnosis not present

## 2019-05-17 NOTE — Progress Notes (Signed)
ID: [name]   DOB: August 26, 1940  MR#: [id number]  NFA#:[id number]  Patient Care Team: Biagio Borg, MD as PCP - General (Internal Medicine) Ladell Pier, MD as Consulting Physician (Internal Medicine) Tyler Pita, MD as Consulting Physician (Radiation Oncology) Roseanne Kaufman, MD as Consulting Physician (Orthopedic Surgery) Lyndal Pulley, DO as Consulting Physician (Family Medicine) OTHER MD:  I connected with [name] on 05/17/19 at  3:00 PM EDT by telephone visit and verified that I am speaking with the correct person using two identifiers.   I discussed the limitations, risks, security and privacy concerns of performing an evaluation and management service by telemedicine and the availability of in-person appointments. I also discussed with the patient that there may be a patient responsible charge related to this service. The patient expressed understanding and agreed to proceed.   Other persons participating in the visit and their role in the encounter: none  Patient's location: home  Provider's location: [place]:   Lakasha was contacted today for follow up of her progressive left upper extremity lymphedema. Since her last visit, she underwent chest CT on 05/09/2019, which showed: 5.1 cm left subpectoral chest wall mass, compatible with recurrence; associated thoracic nodal metastases; two indeterminate small right lower lobe nodules, not overly suspicious for metastatic disease.   REVIEW OF SYSTEMS: Cannie has been reading all her lab and radiology results very carefully and she had many questions today.  Her main problem remains the arm swelling.  She also has some shoulder discomfort and she saw Dr. Tamala Julian for this.  He considered a cortisone shot but astutely decided it would be prudent to wait until her cancer work-up is completed so that the possible results are not compromised.  Otherwise she is at baseline, with no unusual  headaches, visual changes, nausea, vomiting, gait imbalance, or falls, no cough or phlegm production and no change in bowel or bladder habits.   HISTORY OF PRESENT ILLNESS:  From the original intake note:  " Lillyn Wieczorek is a 79 year old [place] woman, who has a history of breast cancer starting in 1997 and in 2013.    On 11/22/95, she underwent a left breast biopsy, which revealed malignant cells.  She had a left breast lumpectomy with re-excision on 11/29/95, with final pathology revealed IDC, grade 2, 2.5 cm, ER 98%, PR 97%, Ki67 8% with one of 15 nodes being positive.  She received chemotherapy, 4 cycles per patient, along with radiation and took Tamoxifen for 7 years following radiation completion.  Records from 1997 limited.    In October 2012, she had a biopsy of the right breast after mammography recommended additional images for a dense area in the right breast.  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  On 11/25/10, she had an MRI that measured the area in the right breast as 2.2 cm.  She started neoadjuvant [name] in November 2012 and has continued it since.  The area of concern measured 1.7 cm on a repeat MRI in March of 2013.  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  Surgical margins were negative with one of one sentinel lymph node positive for metastatic carcinoma.  She then underwent  radiation therapy under the care of Dr. [name] from 10/19/11 to 12/03/11. "   Her subsequent history is as detailed below   PAST MEDICAL HISTORY: Past Medical History:  Diagnosis Date  . ANEMIA-NOS   . ANXIETY   . Breast cancer ([facility]) 1997 L, 2012 R   s/p chemo/xrt  . COPD    resolved  . DIVERTICULOSIS, COLON  2008  . Dizziness   . GERD   . Hx of radiation therapy 10/19/11 -12/03/11   right breast  . HYPERLIPIDEMIA   . IRRITABLE BOWEL SYNDROME, HX OF   . Left-sided carotid artery disease ([facility])    moderate left ICA stenosis  . OSTEOARTHRITIS, HAND   . PSVT (paroxysmal supraventricular tachycardia) ([facility])    symptomatic on event monitor    PAST SURGICAL HISTORY: Past Surgical History:  Procedure Laterality Date  . ABDOMINAL HYSTERECTOMY    . APPENDECTOMY    . BREAST BIOPSY  11/14/10    r breast: inv, insitu mammary carcinoma w/calcif, er/pr +, her2 -  . BREAST SURGERY     lumpectomy  . CATARACT EXTRACTION     both eyes  . ELECTROPHYSIOLOGIC STUDY N/A 05/10/2015   Procedure: SVT Ablation;  Surgeon: Will Meredith Leeds, MD;  Location: [facility];  Service: Cardiovascular;  Laterality: N/A;  . HERNIA REPAIR    . inguinal herniorrhapy  1984   left  . rectal fissure repair    . s/p benign breast biopsy  2003   right  . s/p left foot surgury  2009  . 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 spiral fx left foot  2008   no surgury  . TMJ ARTHROPLASTY  1989  . [place]- to remove scar tissue growth   . TONSILLECTOMY      FAMILY HISTORY Family History  Problem Relation Age of Onset  . Alcohol abuse Mother        ETOH dependence  . Hypertension Mother   . Stroke Mother   . Colon polyps Mother   . Diabetes Mother   . Pancreatic cancer Mother 45  . Uterine cancer Mother 23  . Lymphoma Brother        burkitts  . Lung cancer Paternal Uncle   . Lung cancer Maternal Grandmother 24       non-smoker  . Lung cancer Maternal Grandfather   . Breast cancer Cousin        maternal cousin, dx in her mid 28s  . Brain cancer Cousin        maternal cousin's son; dx in his 77s  . Testicular cancer Cousin        maternal cousin's son;   . Breast cancer Cousin        paternal cousin; dx in her 26s  . Breast cancer Cousin        1 maternal, 2 paternal  . Colon  cancer Neg Hx   . Esophageal cancer Neg Hx   . Stomach cancer Neg Hx   . Rectal cancer Neg Hx     GYNECOLOGIC HISTORY:   Menarche age 6, GX P0 (one miscarriage at 5 months). Status post vaginal hysterectomy in the late 1970s, no salpingo-oophorectomy    SOCIAL HISTORY:   The patient lives alone and is divorced. She retired from [employer], were she worked in Physiological scientist.Marland Kitchen     ADVANCED DIRECTIVES:  Living will in place   HEALTH MAINTENANCE: Social History  Tobacco Use  . Smoking status: Former Smoker    Packs/day: 1.00    Years: 54.00    Pack years: 54.00    Types: Cigarettes    Quit date: 01/03/2016    Years since quitting: 3.3  . Smokeless tobacco: Never Used  Substance Use Topics  . Alcohol use: Yes    Comment: rare/ drinks socially  . Drug use: No    Allergies  Allergen Reactions  . Bee Venom Swelling  . Clindamycin/Lincomycin Diarrhea and Nausea And Vomiting  . Codeine Nausea And Vomiting    Current Outpatient Medications  Medication Sig Dispense Refill  . aspirin 81 MG EC tablet Take 81 mg by mouth daily.      . calcium-vitamin D (OSCAL WITH D) 500-200 MG-UNIT per tablet Take 1 tablet by mouth daily.    . cholecalciferol (VITAMIN D) 1000 UNITS tablet Take 1,000 Units by mouth daily.      . clotrimazole-betamethasone (LOTRISONE) cream Apply 1 application topically 2 (two) times daily as needed. 30 g 1  . diclofenac sodium (VOLTAREN) 1 % GEL Apply 4 g topically 4 (four) times daily as needed. 400 g 11  . diphenhydrAMINE (BENADRYL) 25 MG tablet Take 25 mg by mouth every 6 (six) hours as needed (for bee stings).     . fluticasone (FLONASE) 50 MCG/ACT nasal spray Place 2 sprays into both nostrils daily.    Marland Kitchen loratadine (CLARITIN) 10 MG tablet Take 10 mg by mouth daily.    . meloxicam (MOBIC) 15 MG tablet TAKE 1 TABLET(15 MG) BY MOUTH DAILY Annual appt due in June must see provider for future refills 90 tablet 0  . Multiple Vitamin (MULTIVITAMIN) capsule Take 1  capsule by mouth daily.      Marland Kitchen omeprazole (PRILOSEC) 20 MG capsule TAKE 1 CAPSULE(20 MG) BY MOUTH DAILY 90 capsule 3  . polyethylene glycol (MIRALAX / GLYCOLAX) packet take 1 packet once daily if needed for constipation 30 packet 11  . pseudoephedrine (SUDAFED) 30 MG tablet Take 30 mg by mouth every 4 (four) hours as needed for congestion.     Marland Kitchen telmisartan (MICARDIS) 40 MG tablet Take 1 tablet (40 mg total) by mouth daily. 90 tablet 3  . Tiotropium Bromide Monohydrate (SPIRIVA RESPIMAT) 2.5 MCG/ACT AERS Inhale 2 puffs into the lungs daily. 4 g 11  . zolpidem (AMBIEN) 10 MG tablet TAKE 1 TABLET(10 MG) BY MOUTH AT BEDTIME AS NEEDED 90 tablet 1   No current facility-administered medications for this visit.    OBJECTIVE:  There were no vitals filed for this visit.   There is no height or weight on file to calculate BMI.     Televisit  LAB RESULTS: Lab Results  Component Value Date   WBC 7.8 05/09/2019   NEUTROABS 5.4 05/09/2019   HGB 11.5 (L) 05/09/2019   HCT 34.9 (L) 05/09/2019   MCV 86.0 05/09/2019   PLT 290 05/09/2019      Chemistry      Component Value Date/Time   NA 144 05/09/2019 1303   NA 140 12/09/2015 1409   K 3.6 05/09/2019 1303   K 4.2 12/09/2015 1409   CL 109 05/09/2019 1303   CL 107 02/02/2012 0939   CO2 24 05/09/2019 1303   CO2 25 12/09/2015 1409   BUN 18 05/09/2019 1303   BUN 16.7 12/09/2015 1409   CREATININE 1.02 (H) 05/09/2019 1303   CREATININE 0.8 12/09/2015 1409      Component Value Date/Time   CALCIUM 8.6 (L)  05/09/2019 1303   CALCIUM 9.4 12/09/2015 1409   ALKPHOS 61 05/09/2019 1303   ALKPHOS 54 12/09/2015 1409   AST 23 05/09/2019 1303   AST 20 12/09/2015 1409   ALT 13 05/09/2019 1303   ALT 13 12/09/2015 1409   BILITOT 0.4 05/09/2019 1303   BILITOT <0.22 12/09/2015 1409       Lab Results  Component Value Date   LABCA2 56 (H) 12/10/2010    No components found for: [id number]  No results for input(s): INR in the last 168  hours.  Urinalysis    Component Value Date/Time   COLORURINE YELLOW 07/13/2018 0938   APPEARANCEUR Sl [place] (A) 07/13/2018 0938   LABSPEC 1.020 07/13/2018 0938   PHURINE 6.0 07/13/2018 0938   GLUCOSEU NEGATIVE 07/13/2018 0938   HGBUR NEGATIVE 07/13/2018 0938   BILIRUBINUR NEGATIVE 07/13/2018 0938   KETONESUR NEGATIVE 07/13/2018 0938   UROBILINOGEN 0.2 07/13/2018 0938   NITRITE NEGATIVE 07/13/2018 0938   LEUKOCYTESUR TRACE (A) 07/13/2018 0938    STUDIES: CT Chest W Contrast  Result Date: 05/09/2019 CLINICAL DATA:  Left breast cancer, diagnosed 1997 status post lumpectomy, with left arm lymphedema in 2017. Right breast cancer, diagnosed 2013, status post chemotherapy and XRT. Evaluate for recurrence. EXAM: CT CHEST WITH CONTRAST TECHNIQUE: Multidetector CT imaging of the chest was performed during intravenous contrast administration. CONTRAST:  12m OMNIPAQUE IOHEXOL 300 MG/ML  SOLN COMPARISON:  CT chest dated 03/06/2011 FINDINGS: Cardiovascular: Heart is normal in size. No pericardial effusion. No evidence of thoracic aortic aneurysm. Atherosclerotic calcifications of the aortic arch. Mild three-vessel coronary atherosclerosis. Mediastinum/Nodes: Mild thoracic lymphadenopathy, including a 13 mm short axis AP window node (series 2/image 46), a 12 mm short axis right hilar node (series 2/image 59), and a 13 mm short axis subcarinal node (series 2/image 60), suspicious. Status post left axillary lymph node dissection. 3.1 x 5.1 cm left subpectoral chest wall mass (series 2/image 26). Extension anteriorly/superiorly (series 2/image 19). Associated 7 mm short axis left supraclavicular node (series 2/image 12). Lungs/Pleura: Biapical pleural-parenchymal scarring. Radiation changes at the right lung apex and in the anterior right upper lobe. Mild subpleural reticulation/fibrosis in the lungs bilaterally, lower lobe predominant. Mild centrilobular and paraseptal emphysematous changes, upper lung  predominant. 5 mm triangular subpleural nodule in the anterior right middle lobe along the minor fissure (series 7/image 32), unchanged since 2013, benign. Additional 4 mm triangular subpleural nodule in the anterior right middle lobe (series 7/image 89), unchanged, benign. 3 mm right lower lobe nodule (series 7/image 108), possibly new. 4 x 6 mm nodule in the lateral right lower lobe (series 7/image 33) and 3 mm nodule in the lateral right lower lobe (series 7/image 87), new. Irregular/linear nodular opacity in the right upper lobe (series 7/image 45), new, although favoring mucous plugging. No focal consolidation. No pleural effusion or pneumothorax. Upper Abdomen: Visualized upper abdomen is notable for scattered hepatic cysts, a moderate hiatal hernia, and vascular calcifications. Musculoskeletal: Postsurgical changes related to prior lumpectomy in the medial right breast (series 2/image 78). Skin thickening overlying the left breast (series 2/image 100). Mild degenerative changes of the mid thoracic spine. IMPRESSION: 3.1 x 5.1 cm left subpectoral chest wall mass, compatible with recurrence. Associated thoracic nodal metastases, as described above. Two small right lower lobe nodules are indeterminate although not overly suspicious for metastatic disease. Additional coronary nodules are likely benign. Additional postsurgical and post radiation changes, as above. Aortic Atherosclerosis (ICD10-I70.0) and Emphysema (ICD10-J43.9). Electronically Signed   By: SHenderson NewcomerD.  On: 05/09/2019 15:10   VAS [place] UPPER EXTREMITY VENOUS DUPLEX  Result Date: 04/27/2019 UPPER VENOUS STUDY  Indications: lymphedema Risk Factors: Cancer Breast. Comparison Study: No prior study on file Performing Technologist: Sharion Dove RVS  Examination Guidelines: A complete evaluation includes B-mode imaging, spectral Doppler, color Doppler, and power Doppler as needed of all accessible portions of each vessel. Bilateral testing  is considered an integral part of a complete examination. Limited examinations for reoccurring indications may be performed as noted.  Right Findings: +----------+------------+---------+-----------+----------+-------+ RIGHT     CompressiblePhasicitySpontaneousPropertiesSummary +----------+------------+---------+-----------+----------+-------+ Subclavian               Yes       Yes                      +----------+------------+---------+-----------+----------+-------+  Left Findings: +----------+------------+---------+-----------+----------+-------+ LEFT      CompressiblePhasicitySpontaneousPropertiesSummary +----------+------------+---------+-----------+----------+-------+ IJV           Full       Yes       Yes                      +----------+------------+---------+-----------+----------+-------+ Subclavian               Yes       Yes                      +----------+------------+---------+-----------+----------+-------+ Axillary                 Yes       Yes                      +----------+------------+---------+-----------+----------+-------+ Brachial      Full       Yes       Yes                      +----------+------------+---------+-----------+----------+-------+ Cephalic      Full                                          +----------+------------+---------+-----------+----------+-------+ Basilic       Full                                          +----------+------------+---------+-----------+----------+-------+  Summary:  Right: No evidence of thrombosis in the subclavian.  Left: No evidence of deep vein thrombosis in the upper extremity. No evidence of superficial vein thrombosis in the upper extremity.  *See table(s) above for measurements and observations.  Diagnosing physician: Servando Snare MD Electronically signed by Servando Snare MD on 04/27/2019 at 4:37:52 PM.    Final      ASSESSMENT: 79 y.o. [place] woman:  #1  S/P LEFT breast  lumpectomy with re-excision on 11/29/95 for a T2 N1bi IDC of the left breast, grade 2, Stage IIB, ER 98%, PR 97%, Ki67 8%.  #2  She underwent 4 cycles of AC followed by radiation therapy under the care of Dr. [name].  #3  She took Tamoxifen for a total of seven years   #4  S/P biopsy of the RIGHT breast Upper inner quadrant on 11/14/10 showing invasive ductal carcinoma,, grade 2, ER 85%, PR 57%, Ki67 20%, HER2 not amplified.    #  5  She started neoadjuvant letrozole in November 2012; switched to tamoxifen as of February 2016 due to osteopenia concerns  #6  S/P right lumpectomy with sentinel lymph node biopsy on 09/03/11 for a ypT2, ypN1a, stage IIB invasive lobular carcinoma, grade 2,estrogen receptor 97% positive, progesterone receptor 12% positive, with no HER-2 amplification.  #7  She underwent radiation therapy under the care of Dr. [name] from 10/19/11 to 12/03/11.   #8 patient does not meet criteria for genetic testing according to her insurance company.  #9 osteopenia, with a T score of -1.8 on DEXA scan at [facility] 03/07/2013  (a) status post multiple dental extractions and implant-not a bisphosphonate candidate  (b) repeat bone density at [facility] 04/02/2015 shows a T score of -2.0  RECURRENT DISEASE? April 2021 #10:  Worsening left upper extremity lymphedema led to chest CT scan 05/09/2019 showing a 5.1 cm left subpectoral chest wall mass and thoracic lymphadenopathy of uncertain significance  (a) CT biopst of the chest wall mass 05/25/2019  (b) PET scan   PLAN:   Evea has a large chest wall mass on the left side which is very likely to be metastatic disease and is also likely to be the cause of her lymphedema.  We of course do not know what we are dealing with until we do a biopsy.  I discussed this with Dr. Luan Pulling from [place] and she felt a CT-guided biopsy was most appropriate.  This is scheduled for 05/25/2019.  We also have some mediastinal lymphadenopathy.  If this proves  malignant it would mean stage IV disease.  We need further information so I have written for a PET scan to help clarify this issue.  Cindel has a mild anemia and she was found to be borderline iron deficient late last year.  Oral iron supplementation was suggested but she is taking only a multivitamin.  We will repeat those studies and other anemia studies at the next visit together with tumor markers.  Tentatively I am scheduling her to see me April 30 by which time we should have all the necessary information to make definitive treatment plans  Aurea Graff    05/17/2019 Oncology and Hematology [facility] [place], [place] [address] Tel. [phone]  Joylene Igo [phone]

## 2019-05-18 ENCOUNTER — Telehealth: Payer: Self-pay | Admitting: Oncology

## 2019-05-18 ENCOUNTER — Ambulatory Visit: Payer: Medicare Other

## 2019-05-18 NOTE — Telephone Encounter (Signed)
Scheduled appts per 4/13 los. Pt confirmed appt dates and times.

## 2019-05-19 ENCOUNTER — Other Ambulatory Visit: Payer: Self-pay

## 2019-05-19 ENCOUNTER — Other Ambulatory Visit: Payer: Self-pay | Admitting: Oncology

## 2019-05-19 ENCOUNTER — Ambulatory Visit: Payer: Medicare Other

## 2019-05-19 DIAGNOSIS — I890 Lymphedema, not elsewhere classified: Secondary | ICD-10-CM | POA: Diagnosis not present

## 2019-05-19 DIAGNOSIS — Z86000 Personal history of in-situ neoplasm of breast: Secondary | ICD-10-CM

## 2019-05-19 DIAGNOSIS — M25512 Pain in left shoulder: Secondary | ICD-10-CM | POA: Diagnosis not present

## 2019-05-19 DIAGNOSIS — M25612 Stiffness of left shoulder, not elsewhere classified: Secondary | ICD-10-CM

## 2019-05-19 DIAGNOSIS — C50911 Malignant neoplasm of unspecified site of right female breast: Secondary | ICD-10-CM

## 2019-05-19 DIAGNOSIS — C50211 Malignant neoplasm of upper-inner quadrant of right female breast: Secondary | ICD-10-CM

## 2019-05-19 NOTE — Therapy (Signed)
[place], [place], [address] Phone: [phone]   Fax:  [phone]  Physical Therapy Treatment  Patient Details  Name: Stacey [name] MRN: [id number] Date of Birth: 1940/08/20 Referring Provider (PT): Lurline Del MD   Encounter Date: 05/19/2019  PT End of Session - 05/19/19 1239    Visit Number  18    Number of Visits  31    Date for PT Re-Evaluation  06/23/19    PT Start Time  1110    PT Stop Time  1145    PT Time Calculation (min)  35 min    Activity Tolerance  Patient tolerated treatment well    Behavior During Therapy  [facility] for tasks assessed/performed       Past Medical History:  Diagnosis Date  . ANEMIA-NOS   . ANXIETY   . Breast cancer ([place]) 1997 L, 2012 R   s/p chemo/xrt  . COPD    resolved  . DIVERTICULOSIS, COLON 2008  . Dizziness   . GERD   . Hx of radiation therapy 10/19/11 -12/03/11   right breast  . HYPERLIPIDEMIA   . IRRITABLE BOWEL SYNDROME, HX OF   . Left-sided carotid artery disease (HCC)    moderate left ICA stenosis  . OSTEOARTHRITIS, HAND   . PSVT (paroxysmal supraventricular tachycardia) (HCC)    symptomatic on event monitor    Past Surgical History:  Procedure Laterality Date  . ABDOMINAL HYSTERECTOMY    . APPENDECTOMY    . BREAST BIOPSY  11/14/10    r breast: inv, insitu mammary carcinoma w/calcif, er/pr +, her2 -  . BREAST SURGERY     lumpectomy  . CATARACT EXTRACTION     both eyes  . ELECTROPHYSIOLOGIC STUDY N/A 05/10/2015   Procedure: SVT Ablation;  Surgeon: Will Meredith Leeds, MD;  Location: [place] [facility];  Service: Cardiovascular;  Laterality: N/A;  . HERNIA REPAIR    . inguinal herniorrhapy  1984   left  . rectal fissure repair    . s/p benign breast biopsy  2003   right  . s/p left foot surgury  2009  . 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 spiral fx left foot  2008   no surgury  . TMJ ARTHROPLASTY  1989  . [place]- to remove scar tissue growth   . TONSILLECTOMY      There were no vitals filed for this visit.  Subjective Assessment - 05/19/19 1242    Subjective  Pt reports that she has a lot of appointments coming up but doesn't want her arm to swell. She states that she is going to be very busy and is concerned about physical therapy.    Pertinent History  Hx breast cancer with 15 lymph node removal on the L and Bil lumpectomy. Lumpectomy on the L in 1997and the R in 2012. Radiation Bil    Patient Stated Goals  I want to take care of the swelling in my arm and learn what to do about it.    Currently in Pain?  No/denies    Pain Score  0-No pain                  Outpatient Rehab from 03/28/2019 in [place]  Lymphedema Life Impact Scale Total Score  45.59 %           [facility] Adult PT Treatment/Exercise - 05/19/19  0001      Manual Therapy   Manual Therapy  Edema management    Edema Management  Folded TG soft was used without foam this session due to foam kept making TG soft slide after her last session. She had no significant increase which means there must be adequate compression to prevent significant swelling with the TG soft. Size small was placed over the wrist/hand with tape at the top but not encircling the wrist to help keep it from rolling. Pt compression sleeves were inspected and picked one that was adequate for pt to wear during the day. Pt donned this sleeve under physical therapist supervision with gauntlet. Pt was slightly breathless and needed a short break but did well over all.              PT Education - 05/19/19 1240    Education Details  Pt will wear TG soft at night folded over the arm and hand. Wear compression sleeve and hand gauntlet during the day. Discussed importance of not wearing her sleeve at night in order to prevent blockage of the lymphatic flow.    Person(s) Educated  Patient    Methods  Explanation     Comprehension  Verbalized understanding       PT Short Term Goals - 05/03/19 1518      PT SHORT TERM GOAL #1   Title  Pt will be independent with HEP and MLD within 2 weeks in order to decrease improve autonomy of care.    Baseline  Pt currently is not performing MLD at home. and doing exercises occasionally    Time  3    Period  Weeks    Status  On-going    Target Date  05/31/19        PT Long Term Goals - 05/03/19 1519      PT LONG TERM GOAL #1   Title  Patient will have reduction of LUE limb girth by 3 cm or greater in the proximal and distal brachium/antebrachium within 4 weeks in order to demonstrate decrease edema.    Baseline  pt is progressing slightly but has scar tissue that is slowing progress see measurements.    Time  6    Period  Weeks    Status  On-going    Target Date  06/21/19      PT LONG TERM GOAL #2   Title  Patient to be properly fitted with appropriate compression garment to wear on daily basis for home management.    Baseline  Pt currently does not have compression that fits    Time  6    Period  Weeks    Status  On-going    Target Date  06/21/19      PT LONG TERM GOAL #3   Title  Pt will improve A/ROM of the L shoulder to 120 degrees flexion/abduction within 6 weeks in order to decrease risk for immobility.    Baseline  100 degrees flexion/70 degrees abduction    Time  6    Period  Weeks    Status  On-going    Target Date  06/21/19      PT LONG TERM GOAL #4   Title  Pt will report 50% improvement in pain/heaviness in the LUE within 4 weeks in order to demonstrate an improvementin quality of life.    Baseline  pt reports no significant improvement at this time.    Time  6    Period  Weeks    Status  On-going    Target Date  06/21/19      PT LONG TERM GOAL #5   Title  Pt will improve LLIS to 30% or less within 4 weeks in order to demonstrate objective improvement in functional activitie.            Plan - 05/19/19 1236    Clinical  Impression Statement  Extra time spent today discussing the POC with the patient due to new concerns for possible re-occurance on the L. She has a PET scan and multiple tests/MD appointments. She states that physical therapy helps her arm decrease in size but she has no pain relief from physical therapy. We discussed options today as far as what she can wear on her LUE in order to help maintiain fluid while she is busy with other appointments since she is not getting any pain relief but we do not want her arm to swell. Pt will wear TG soft at night over the arm and hand folded for extra compression. This physical therapist went through patients sleeves and picked one of an appropriate size that she donned today and fit well. She will wear that sleeve during the day with her hand gauntlet. When pt returns on Monday will determine frequency and POC. Pt is agreeable.    Personal Factors and Comorbidities  Comorbidity 2    Comorbidities  Bil radiation due to Bil breast cancer and lumpectomy, 15 lymph node removal on the L    Rehab Potential  Good    PT Frequency  3x / week    PT Duration  6 weeks    PT Treatment/Interventions  Therapeutic activities;Therapeutic exercise;Iontophoresis 44m/ml Dexamethasone;Patient/family education;Manual techniques;Neuromuscular re-education    PT Next Visit Plan  assess TG soft and use of compression sleeve, determine POC    PT Home Exercise Plan  Access Code: [email or web address]   Consulted and Agree with Plan of Care  Patient       Patient will benefit from skilled therapeutic intervention in order to improve the following deficits and impairments:  Decreased range of motion, Pain, Increased edema  Visit Diagnosis: History of ductal carcinoma in situ (DCIS) of breast  Lymphedema, not elsewhere classified  Left shoulder pain, unspecified chronicity  Stiffness of left shoulder, not elsewhere classified     Problem List Patient Active Problem List   Diagnosis Date Noted   . Recurrent breast cancer ([place] 05/15/2019  . Pes anserine bursitis 03/31/2019  . HTN (hypertension) 02/10/2019  . Hyperglycemia 06/21/2018  . Left carpal tunnel syndrome 02/22/2018  . Rotator cuff arthropathy of left shoulder 09/20/2017  . Cervical radiculitis 04/09/2017  . Left shoulder pain 04/09/2017  . Dyspnea on exertion 05/14/2016  . 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 Lymphedema of left arm 01/14/2016  . Left arm swelling 12/25/2015  . SVT (supraventricular tachycardia) ([place]   . Left-sided carotid artery disease ([place] 03/08/2015  . Dizziness 01/24/2015  . Urinary frequency 01/13/2015  . Dizziness and giddiness 01/09/2015  . Gallstones 02/21/2013  . Breast cancer of upper-inner quadrant of right female breast ([place] 11/28/2012  . Muscle cramping 09/12/2012  . Right hip pain 09/12/2012  . Lower back pain 09/12/2012  . COPD GOLD I    . Hx of radiation therapy   . Right shoulder pain 09/14/2011  . Preventative health care 07/29/2010  . Skin lesion of left leg 07/29/2010  . Paresthesia 07/29/2010  . GERD 03/28/2010  .  CONSTIPATION 03/28/2010  . Osteoarthrosis, 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  . Anxiety state 05/03/2009  . Hyperlipidemia 06/14/2007  . FATIGUE 06/14/2007  . 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 DIVERTICULOSIS, COLON 12/17/2006  . Cough 12/17/2006  . IRRITABLE BOWEL SYNDROME, HX OF 12/17/2006    Ander Purpura, PT 05/19/2019, 12:44 PM  [place], [place], [address] Phone: [phone]   Fax:  [phone]  Name: Stacey [name] MRN: [id number] Date of Birth: April 30, 1940

## 2019-05-22 ENCOUNTER — Ambulatory Visit: Payer: Medicare Other

## 2019-05-22 ENCOUNTER — Other Ambulatory Visit: Payer: Self-pay

## 2019-05-22 DIAGNOSIS — I890 Lymphedema, not elsewhere classified: Secondary | ICD-10-CM | POA: Diagnosis not present

## 2019-05-22 DIAGNOSIS — Z86000 Personal history of in-situ neoplasm of breast: Secondary | ICD-10-CM | POA: Diagnosis not present

## 2019-05-22 DIAGNOSIS — M25512 Pain in left shoulder: Secondary | ICD-10-CM

## 2019-05-22 DIAGNOSIS — M25612 Stiffness of left shoulder, not elsewhere classified: Secondary | ICD-10-CM

## 2019-05-22 NOTE — Therapy (Signed)
[place], [place], [address] Phone: [phone]   Fax:  [phone]  Physical Therapy Discharge Note  Patient Details  Name: Stacey [name] MRN: [id number] Date of Birth: Jan 30, 1941 Referring Provider (PT): Lurline Del MD   Encounter Date: 05/22/2019  PT End of Session - 05/22/19 1618    Visit Number  19    Number of Visits  31    Date for PT Re-Evaluation  06/23/19    PT Start Time  9476    PT Stop Time  1645    PT Time Calculation (min)  35 min    Activity Tolerance  Patient tolerated treatment well    Behavior During Therapy  [facility] for tasks assessed/performed       Past Medical History:  Diagnosis Date  . ANEMIA-NOS   . ANXIETY   . Breast cancer ([place]) 1997 L, 2012 R   s/p chemo/xrt  . COPD    resolved  . DIVERTICULOSIS, COLON 2008  . Dizziness   . GERD   . Hx of radiation therapy 10/19/11 -12/03/11   right breast  . HYPERLIPIDEMIA   . IRRITABLE BOWEL SYNDROME, HX OF   . Left-sided carotid artery disease (HCC)    moderate left ICA stenosis  . OSTEOARTHRITIS, HAND   . PSVT (paroxysmal supraventricular tachycardia) (HCC)    symptomatic on event monitor    Past Surgical History:  Procedure Laterality Date  . ABDOMINAL HYSTERECTOMY    . APPENDECTOMY    . BREAST BIOPSY  11/14/10    r breast: inv, insitu mammary carcinoma w/calcif, er/pr +, her2 -  . BREAST SURGERY     lumpectomy  . CATARACT EXTRACTION     both eyes  . ELECTROPHYSIOLOGIC STUDY N/A 05/10/2015   Procedure: SVT Ablation;  Surgeon: Will Meredith Leeds, MD;  Location: [place] [facility];  Service: Cardiovascular;  Laterality: N/A;  . HERNIA REPAIR    . inguinal herniorrhapy  1984   left  . rectal fissure repair    . s/p benign breast biopsy  2003   right  . s/p left foot surgury  2009  . 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 spiral fx left foot  2008   no surgury  . TMJ ARTHROPLASTY  1989  . [place]- to remove scar tissue growth   . TONSILLECTOMY      There were no vitals filed for this visit.  Subjective Assessment - 05/22/19 1618    Subjective  Pt states that she really likes the TG soft at night. She states that she has been wearing her compression sleeve during the day and the TG soft at night. She felt some swelling yesterday but feels that she swells sometimes. She has been able to manage her swelling with the TG soft and the sleeve adequately.    Pertinent History  Hx breast cancer with 15 lymph node removal on the L and Bil lumpectomy. Lumpectomy on the L in 1997and the R in 2012. Radiation Bil    Patient Stated Goals  I want to take care of the swelling in my arm and learn what to do about it.    Currently in Pain?  No/denies    Pain Score  0-No pain            LYMPHEDEMA/ONCOLOGY QUESTIONNAIRE - 05/22/19 1622      Left Upper Extremity Lymphedema   15  cm Proximal to Olecranon Process  34 cm    10 cm Proximal to Olecranon Process  37 cm    Olecranon Process  30 cm    15 cm Proximal to Ulnar Styloid Process  31.5 cm    10 cm Proximal to Ulnar Styloid Process  27 cm    Just Proximal to Ulnar Styloid Process  17.8 cm    Across Hand at [employer]  19.5 cm    At [place] Edge of 2nd Digit  6.8 cm           [facility]  Lymphedema Life Impact Scale Total Score  45.59 %           [facility] Adult PT Treatment/Exercise - 05/22/19 0001      Manual Therapy   Manual Therapy  Edema management    Edema Management  Circumferential measurements, TG soft adjusted for pt for medium piece to go over the dorsum of the hand for comfort.              PT Education - 05/22/19 1648    Education Details  Pt will continue weraing TG soft at night and when she cannot tolerate her compression sleeve. She will wear her compression sleeve during the day as tolerated and if it bothers her she will  remove it, apply TG soft and elevate her arm.    Person(s) Educated  Patient    Methods  Explanation    Comprehension  Verbalized understanding       PT Short Term Goals - 05/22/19 1651      PT SHORT TERM GOAL #1   Title  Pt will be independent with HEP and MLD within 2 weeks in order to decrease improve autonomy of care.    Status  Partially Met        PT Long Term Goals - 05/22/19 1651      PT LONG TERM GOAL #1   Title  Patient will have reduction of LUE limb girth by 3 cm or greater in the proximal and distal brachium/antebrachium within 4 weeks in order to demonstrate decrease edema.    Baseline  see measurements goal not met    Status  Partially Met      PT LONG TERM GOAL #2   Title  Patient to be properly fitted with appropriate compression garment to wear on daily basis for home management.    Baseline  pt is wearing an old sleeve and TG soft at this time.    Status  Partially Met      PT LONG TERM GOAL #3   Title  Pt will improve A/ROM of the L shoulder to 120 degrees flexion/abduction within 6 weeks in order to decrease risk for immobility.      PT LONG TERM GOAL #4   Title  Pt will report 50% improvement in pain/heaviness in the LUE within 4 weeks in order to demonstrate an improvementin quality of life.    Baseline  pt reports no significant improvement at this time.    Status  Not Met            Plan - 05/22/19 1617    Clinical Impression Statement  Pt presents today with slight increase in circumferential measurements in her proximal antebrachium and distal brachium than last session but a decrease in the distal antebrachium and proximal brachium. She has less swelling that she had without compression and states that she  feels she is able to control her edema with the sleeve and TG soft at night. Due to pt has a change in medical status she will be discharged from physical therapy at this time. She is aware of reasons to return to physical therapy including an  increase in fluid that she is unable to control or if she does not have a reduction in fluid in the LUE once her condition has been treated or resolved.    Personal Factors and Comorbidities  Comorbidity 2    Comorbidities  Bil radiation due to Bil breast cancer and lumpectomy, 15 lymph node removal on the L    Rehab Potential  Good    PT Frequency  --    PT Duration  --    PT Treatment/Interventions  Therapeutic activities;Therapeutic exercise;Iontophoresis 53m/ml Dexamethasone;Patient/family education;Manual techniques;Neuromuscular re-education    PT Next Visit Plan  pt will be discharged    PT Home Exercise Plan  Access Code: [email or web address]   Consulted and Agree with Plan of Care  Patient       Patient will benefit from skilled therapeutic intervention in order to improve the following deficits and impairments:  Decreased range of motion, Pain, Increased edema  Visit Diagnosis: History of ductal carcinoma in situ (DCIS) of breast  Lymphedema, not elsewhere classified  Left shoulder pain, unspecified chronicity  Stiffness of left shoulder, not elsewhere classified     Problem List Patient Active Problem List   Diagnosis Date Noted  . Recurrent breast cancer ([place] 05/15/2019  . Pes anserine bursitis 03/31/2019  . HTN (hypertension) 02/10/2019  . Hyperglycemia 06/21/2018  . Left carpal tunnel syndrome 02/22/2018  . Rotator cuff arthropathy of left shoulder 09/20/2017  . Cervical radiculitis 04/09/2017  . Left shoulder pain 04/09/2017  . Dyspnea on exertion 05/14/2016  . 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 Lymphedema of left arm 01/14/2016  . Left arm swelling 12/25/2015  . SVT (supraventricular tachycardia) ([facility]   . Left-sided carotid artery disease ([place] 03/08/2015  . Dizziness 01/24/2015  . Urinary frequency 01/13/2015  . Dizziness and giddiness 01/09/2015  . Gallstones 02/21/2013  . Breast cancer of upper-inner quadrant of right female  breast ([place] 11/28/2012  . Muscle cramping 09/12/2012  . Right hip pain 09/12/2012  . Lower back pain 09/12/2012  . COPD GOLD I    . Hx of radiation therapy   . Right shoulder pain 09/14/2011  . Preventative health care 07/29/2010  . Skin lesion of left leg 07/29/2010  . Paresthesia 07/29/2010  . GERD 03/28/2010  . CONSTIPATION 03/28/2010  . Osteoarthrosis, 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  . Anxiety state 05/03/2009  . Hyperlipidemia 06/14/2007  . FATIGUE 06/14/2007  . 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 DIVERTICULOSIS, COLON 12/17/2006  . Cough 12/17/2006  . IRRITABLE BOWEL SYNDROME, HX OF 12/17/2006   PHYSICAL THERAPY DISCHARGE SUMMARY  Plan: Patient agrees to discharge.  Patient goals were not met. Patient is being discharged due to a change in medical status.  ?????       CAnder Purpura PT 05/22/2019, 4:53 PM  [place] [address]Phone: [phone]  Fax:  [phone] Name: Stacey BURSTONMRN: [id number] of Birth: 2June 23, 1942

## 2019-05-23 ENCOUNTER — Ambulatory Visit (INDEPENDENT_AMBULATORY_CARE_PROVIDER_SITE_OTHER): Payer: Medicare Other

## 2019-05-23 VITALS — BP 130/80 | HR 91 | Temp 98.0°F | Resp 16 | Ht 64.0 in | Wt 170.8 lb

## 2019-05-23 DIAGNOSIS — Z0000 Encounter for general adult medical examination without abnormal findings: Secondary | ICD-10-CM

## 2019-05-23 NOTE — Progress Notes (Signed)
Subjective:   Stacey [name] is a 79 y.o. female who presents for Medicare Annual (Subsequent) preventive examination.  Review of Systems:  No ROS. Medicare Wellness Visit. Cardiac Risk Factors include: advanced age (>32mn, >>40women);dyslipidemia;hypertension Sleep Patterns: Issues with insomnia; treated with Ambien. Home Safety/Smoke Alarms: Feels safe in home; Smoke alarms in place. Living environment: Lives in a 1-story home; lives alone. No DME needed. Seat Belt Safety/Bike Helmet: Wears seat belt.    Objective:     Vitals: BP 130/80 (BP Location: Right Arm, Patient Position: Sitting, Cuff Size: Normal)   Pulse 91   Temp 98 F (36.7 C)   Resp 16   Ht _0  (1.626 m)   Wt 170 lb 12.8 oz (77.5 kg)   SpO2 94%   BMI 29.32 kg/m   Body mass index is 29.32 kg/m.  Advanced Directives 05/23/2019 03/28/2019 05/20/2018 06/01/2017 12/19/2015 05/10/2015 12/19/2014  Does Patient Have a Medical Advance Directive? Yes Yes Yes No Yes Yes Yes  Type of AParamedicof AOcostaLiving will - HCulber[place]Living will - Living will HCatahoulaLiving will Living will  Does patient want to make changes to medical advance directive? - No - Patient declined - - - No - Patient declined -  Copy of [place] Chart? No - copy requested - No - copy requested - - - -    Tobacco Social History   Tobacco Use  Smoking Status Former Smoker  . Packs/day: 1.00  . Years: 54.00  . Pack years: 54.00  . Types: Cigarettes  . Quit date: 01/03/2016  . Years since quitting: 3.3  Smokeless Tobacco Never Used     Counseling given: No   Clinical Intake:  Pre-visit preparation completed: Yes  Pain : 0-10 Pain Score: 0-No pain Pain Type: Chronic pain Pain Location: Arm Pain Orientation: Left Pain Descriptors / Indicators: Aching Pain Onset: More than a month ago Pain Frequency: Intermittent     BMI - recorded: 29.3  Nutritional Status: BMI 25 -29 Overweight Nutritional Risks: None Diabetes: No     Interpreter Needed?: No  Information entered by :: Reyn Faivre N. HLowell Guitar LPN  Past Medical History:  Diagnosis Date  . ANEMIA-NOS   . ANXIETY   . Breast cancer [place] L, 2012 R   s/p chemo/xrt  . COPD    resolved  . DIVERTICULOSIS, COLON 2008  . Dizziness   . GERD   . Hx of radiation therapy 10/19/11 -12/03/11   right breast  . HYPERLIPIDEMIA   . IRRITABLE BOWEL SYNDROME, HX OF   . Left-sided carotid artery disease (HCC)    moderate left ICA stenosis  . OSTEOARTHRITIS, HAND   . PSVT (paroxysmal supraventricular tachycardia) (HCC)    symptomatic on event monitor   Past Surgical History:  Procedure Laterality Date  . ABDOMINAL HYSTERECTOMY    . APPENDECTOMY    . BREAST BIOPSY  11/14/10    r breast: inv, insitu mammary carcinoma w/calcif, er/pr +, her2 -  . BREAST SURGERY     lumpectomy  . CATARACT EXTRACTION     both eyes  . ELECTROPHYSIOLOGIC STUDY N/A 05/10/2015   Procedure: SVT Ablation;  Surgeon: Will MMeredith Leeds MD;  [facility];  Service: Cardiovascular;  Laterality: N/A;  . HERNIA REPAIR    . inguinal herniorrhapy  1984   left  . rectal fissure repair    . s/p benign breast biopsy  2003  right  . s/p left foot surgury  2009  . 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 spiral fx left foot  2008   no surgury  . TMJ ARTHROPLASTY  1989  . [place]- to remove scar tissue growth   . TONSILLECTOMY     Family History  Problem Relation Age of Onset  . Alcohol abuse Mother        ETOH dependence  . Hypertension Mother   . Stroke Mother   . Colon polyps Mother   . Diabetes Mother   . Pancreatic cancer Mother 44  . Uterine cancer Mother 90  . Lymphoma Brother        burkitts  . Lung cancer Paternal Uncle   . Lung cancer Maternal Grandmother 22       non-smoker  . Lung cancer Maternal Grandfather   . Breast cancer Cousin         maternal cousin, dx in her mid 57s  . Brain cancer Cousin        maternal cousin's son; dx in his 68s  . Testicular cancer Cousin        maternal cousin's son;   . Breast cancer Cousin        paternal cousin; dx in her 2s  . Breast cancer Cousin        1 maternal, 2 paternal  . Colon cancer Neg Hx   . Esophageal cancer Neg Hx   . Stomach cancer Neg Hx   . Rectal cancer Neg Hx    Social History   Socioeconomic History  . Marital status: Divorced    Spouse name: 2 Step-children  . Number of children: 2  . Years of education: Not on file  . Highest education level: Not on file  Occupational History  . Occupation: retired Banker  Tobacco Use  . Smoking status: Former Smoker    Packs/day: 1.00    Years: 54.00    Pack years: 54.00    Types: Cigarettes    Quit date: 01/03/2016    Years since quitting: 3.3  . Smokeless tobacco: Never Used  Substance and Sexual Activity  . Alcohol use: Yes    Comment: rare/ drinks socially  . Drug use: No  . Sexual activity: Never  Other Topics Concern  . Not on file  Social History Narrative   Patient gets no regular exercise   No biological children   2 step children   Social Determinants of Health   Financial Resource Strain:   . Difficulty of Paying Living Expenses:   Food Insecurity:   . Worried About Charity fundraiser in the Last Year:   . Arboriculturist in the Last Year:   Transportation Needs:   . Film/video editor (Medical):   Marland Kitchen Lack of Transportation (Non-Medical):   Physical Activity:   . Days of Exercise per Week:   . Minutes of Exercise per Session:   Stress:   . Feeling of Stress :   Social Connections:   . Frequency of Communication with Friends and Family:   . Frequency of Social Gatherings with Friends and Family:   . Attends Religious Services:   . Active Member of Clubs or Organizations:   . Attends Archivist Meetings:   Marland Kitchen Marital Status:     Outpatient Encounter Medications as of  05/23/2019  Medication Sig  . aspirin 81 MG EC tablet Take 81  mg by mouth daily.    . calcium-vitamin D (OSCAL WITH D) 500-200 MG-UNIT per tablet Take 1 tablet by mouth daily.  . cholecalciferol (VITAMIN D) 1000 UNITS tablet Take 1,000 Units by mouth daily.    . clotrimazole-betamethasone (LOTRISONE) cream Apply 1 application topically 2 (two) times daily as needed.  . diclofenac sodium (VOLTAREN) 1 % GEL Apply 4 g topically 4 (four) times daily as needed.  . diphenhydrAMINE (BENADRYL) 25 MG tablet Take 25 mg by mouth every 6 (six) hours as needed (for bee stings).   . fluticasone (FLONASE) 50 MCG/ACT nasal spray Place 2 sprays into both nostrils daily.  Marland Kitchen loratadine (CLARITIN) 10 MG tablet Take 10 mg by mouth daily.  . meloxicam (MOBIC) 15 MG tablet TAKE 1 TABLET(15 MG) BY MOUTH DAILY Annual appt due in June must see provider for future refills  . Multiple Vitamin (MULTIVITAMIN) capsule Take 1 capsule by mouth daily.    Marland Kitchen omeprazole (PRILOSEC) 20 MG capsule TAKE 1 CAPSULE(20 MG) BY MOUTH DAILY  . polyethylene glycol (MIRALAX / GLYCOLAX) packet take 1 packet once daily if needed for constipation  . pseudoephedrine (SUDAFED) 30 MG tablet Take 30 mg by mouth every 4 (four) hours as needed for congestion.   Marland Kitchen telmisartan (MICARDIS) 40 MG tablet Take 1 tablet (40 mg total) by mouth daily.  . Tiotropium Bromide Monohydrate (SPIRIVA RESPIMAT) 2.5 MCG/ACT AERS Inhale 2 puffs into the lungs daily.  Marland Kitchen zolpidem (AMBIEN) 10 MG tablet TAKE 1 TABLET(10 MG) BY MOUTH AT BEDTIME AS NEEDED   No facility-administered encounter medications on file as of 05/23/2019.    Activities of Daily Living In your present state of health, do you have any difficulty performing the following activities: 05/23/2019  Hearing? N  Vision? N  Difficulty concentrating or making decisions? N  Walking or climbing stairs? N  Dressing or bathing? N  Doing errands, shopping? N  Preparing Food and eating ? N  Using the Toilet? N   In the past six months, have you accidently leaked urine? N  Do you have problems with loss of bowel control? N  Managing your Medications? N  Managing your Finances? N  Housekeeping or managing your Housekeeping? N  Some recent data might be hidden    Patient Care Team: Biagio Borg, MD as PCP - General (Internal Medicine) Ladell Pier, MD as Consulting Physician (Internal Medicine) Tyler Pita, MD as Consulting Physician (Radiation Oncology) Roseanne Kaufman, MD as Consulting Physician (Orthopedic Surgery) Lyndal Pulley, DO as Consulting Physician (Family Medicine)    Assessment:   This is a routine wellness examination for [facility]  Exercise Activities and Dietary recommendations Current Exercise Habits: The patient does not participate in regular exercise at present, Exercise limited by: respiratory conditions(s);orthopedic condition(s)  Goals    . Client understands the importance of follow-up with providers by attending scheduled visits    . exercise     Continue to stay physically and socially active. Stay as healthy and as independent as possible.     . Patient Stated     Would like to find a way to get rid of Lymphedema.       Fall Risk Fall Risk  05/23/2019 02/10/2019 05/20/2018 04/09/2017 05/14/2016  Falls in the past year? 0 1 0 No No  Number falls in past yr: 0 0 0 - -  Injury with Fall? 0 0 - - -  Comment - - - - -  Risk for fall due to :  No Fall Risks - - - -  Follow up Falls evaluation completed;Education provided;Falls prevention discussed - Falls prevention discussed - -   Is the patient's home free of loose throw rugs in walkways, pet beds, electrical cords, etc?   yes      Grab bars in the bathroom? no      Handrails on the stairs?   no      Adequate lighting?   yes    Depression Screen PHQ 2/9 Scores 05/23/2019 02/10/2019 05/20/2018 04/09/2017  PHQ - 2 Score 0 0 0 0            Immunization History  Administered Date(s) Administered  . Fluad  Quad(high Dose 65+) 11/03/2018  . Influenza Split 12/17/2011  . Influenza Whole 12/17/2006, 10/23/2008, 10/18/2010  . Influenza, High Dose Seasonal PF 11/16/2013, 11/26/2017  . Influenza-Unspecified 11/02/2012, 11/17/2014, 11/05/2015  . Pneumococcal Conjugate-13 12/15/2013  . Pneumococcal Polysaccharide-23 01/20/2006  . Td 06/14/2008  . Tdap 02/10/2019    Qualifies for Shingles Vaccine? Yes  Screening Tests Health Maintenance  Topic Date Due  . COVID-19 Vaccine (1) Never done  . INFLUENZA VACCINE  09/03/2019  . TETANUS/TDAP  02/09/2029  . DEXA SCAN  Completed  . PNA vac Low Risk Adult  Completed    Cancer Screenings: Lung: Low Dose CT Chest recommended if Age 92-80 years, 30 pack-year currently smoking OR have quit w/in 15years. Patient does qualify. Breast:  Up to date on Mammogram? Yes, last done on 12/27/2018   Up to date of Bone Density/Dexa? No, last done on 04/02/2015 Colorectal: Yes, not recommended due to age; last done on 01/02/2014     Plan:     Reviewed health maintenance screenings with patient today and relevant education, vaccines, and/or referrals were provided.    Continue doing brain stimulating activities (puzzles, reading, adult coloring books, staying active) to keep memory sharp.    Continue to eat heart healthy diet (full of fruits, vegetables, whole grains, lean protein, water--limit salt, fat, and sugar intake) and increase physical activity as tolerated.  I have personally reviewed and noted the following in the patient's chart:   . Medical and social history . Use of alcohol, tobacco or illicit drugs  . Current medications and supplements . Functional ability and status . Nutritional status . Physical activity . Advanced directives . List of other physicians . Hospitalizations, surgeries, and ER visits in previous 12 months . Vitals . Screenings to include cognitive, depression, and falls . Referrals and appointments  In addition, I have  reviewed and discussed with patient certain preventive protocols, quality metrics, and best practice recommendations. A written personalized care plan for preventive services as well as general preventive health recommendations were provided to patient.     Sheral Flow, LPN  [id number]  Nurse Health Advisor

## 2019-05-23 NOTE — Patient Instructions (Addendum)
Stacey [name] , Thank you for taking time to come for your Medicare Wellness Visit. I appreciate your ongoing commitment to your health goals. Please review the following plan we discussed and let me know if I can assist you in the future.   Screening recommendations/referrals: Colorectal Screening: 01/02/2014 Mammogram: 12/27/2018 Bone Density: 04/02/2015  Vision and Dental Exams: Recommended annual ophthalmology exams for early detection of glaucoma and other disorders of the eye Recommended annual dental exams for proper oral hygiene  Vaccinations: Influenza vaccine: 11/03/2018 Pneumococcal vaccine: completed Tdap vaccine: 02/10/2019; due every 10 years Shingles vaccine: Please call your insurance company to determine your out of pocket expense for the Shingrix vaccine. You may receive this vaccine at your local pharmacy. Covid vaccine: completed  Advanced directives:  I have provided a copy for you to complete at home and have notarized. Once this is complete please bring a copy in to our office so we can scan it into your chart.  Goals:  Recommend to drink at least 6-8 8oz glasses of water per day.  Recommend to exercise for at least 150 minutes per week.  Recommend to remove any items from the home that may cause slips or trips.  Recommend to decrease portion sizes by eating 3 small healthy meals and at least 2 healthy snacks per day.  Recommend to begin DASH diet as directed below  Recommend to continue efforts to reduce smoking habits until no longer smoking. Smoking Cessation literature is attached below.  Next appointment: Please schedule your Annual Wellness Visit with your Nurse Health Advisor in one year.  Preventive Care 75 Years and Older, Female Preventive care refers to lifestyle choices and visits with your health care provider that can promote health and wellness. What does preventive care include?  A yearly physical exam. This is also called an annual well  check.  Dental exams once or twice a year.  Routine eye exams. Ask your health care provider how often you should have your eyes checked.  Personal lifestyle choices, including:  Daily care of your teeth and gums.  Regular physical activity.  Eating a healthy diet.  Avoiding tobacco and drug use.  Limiting alcohol use.  Practicing safe sex.  Taking low-dose aspirin every day if recommended by your health care provider.  Taking vitamin and mineral supplements as recommended by your health care provider. What happens during an annual well check? The services and screenings done by your health care provider during your annual well check will depend on your age, overall health, lifestyle risk factors, and family history of disease. Counseling  Your health care provider may ask you questions about your:  Alcohol use.  Tobacco use.  Drug use.  Emotional well-being.  Home and relationship well-being.  Sexual activity.  Eating habits.  History of falls.  Memory and ability to understand (cognition).  Work and work Statistician.  Reproductive health. Screening  You may have the following tests or measurements:  Height, weight, and BMI.  Blood pressure.  Lipid and cholesterol levels. These may be checked every 5 years, or more frequently if you are over 67 years old.  Skin check.  Lung cancer screening. You may have this screening every year starting at age 55 if you have a 30-pack-year history of smoking and currently smoke or have quit within the past 15 years.  Fecal occult blood test (FOBT) of the stool. You may have this test every year starting at age 67.  Flexible sigmoidoscopy or colonoscopy. You may  have a sigmoidoscopy every 5 years or a colonoscopy every 10 years starting at age 17.  Hepatitis C blood test.  Hepatitis B blood test.  Sexually transmitted disease (STD) testing.  Diabetes screening. This is done by checking your blood sugar  (glucose) after you have not eaten for a while (fasting). You may have this done every 1-3 years.  Bone density scan. This is done to screen for osteoporosis. You may have this done starting at age 62.  Mammogram. This may be done every 1-2 years. Talk to your health care provider about how often you should have regular mammograms. Talk with your health care provider about your test results, treatment options, and if necessary, the need for more tests. Vaccines  Your health care provider may recommend certain vaccines, such as:  Influenza vaccine. This is recommended every year.  Tetanus, diphtheria, and acellular pertussis (Tdap, Td) vaccine. You may need a Td booster every 10 years.  Zoster vaccine. You may need this after age 65.  Pneumococcal 13-valent conjugate (PCV13) vaccine. One dose is recommended after age 59.  Pneumococcal polysaccharide (PPSV23) vaccine. One dose is recommended after age 58. Talk to your health care provider about which screenings and vaccines you need and how often you need them. This information is not intended to replace advice given to you by your health care provider. Make sure you discuss any questions you have with your health care provider. Document Released: 02/15/2015 Document Revised: 10/09/2015 Document Reviewed: 11/20/2014 Elsevier Interactive Patient Education  2017 [place] Prevention in the Home Falls can cause injuries. They can happen to people of all ages. There are many things you can do to make your home safe and to help prevent falls. What can I do on the outside of my home?  Regularly fix the edges of walkways and driveways and fix any cracks.  Remove anything that might make you trip as you walk through a door, such as a raised step or threshold.  Trim any bushes or trees on the path to your home.  Use bright outdoor lighting.  Clear any walking paths of anything that might make someone trip, such as rocks or  tools.  Regularly check to see if handrails are loose or broken. Make sure that both sides of any steps have handrails.  Any raised decks and porches should have guardrails on the edges.  Have any leaves, snow, or ice cleared regularly.  Use sand or salt on walking paths during winter.  Clean up any spills in your garage right away. This includes oil or grease spills. What can I do in the bathroom?  Use night lights.  Install grab bars by the toilet and in the tub and shower. Do not use towel bars as grab bars.  Use non-skid mats or decals in the tub or shower.  If you need to sit down in the shower, use a plastic, non-slip stool.  Keep the floor dry. Clean up any water that spills on the floor as soon as it happens.  Remove soap buildup in the tub or shower regularly.  Attach bath mats securely with double-sided non-slip rug tape.  Do not have throw rugs and other things on the floor that can make you trip. What can I do in the bedroom?  Use night lights.  Make sure that you have a light by your bed that is easy to reach.  Do not use any sheets or blankets that are too big for your  bed. They should not hang down onto the floor.  Have a firm chair that has side arms. You can use this for support while you get dressed.  Do not have throw rugs and other things on the floor that can make you trip. What can I do in the kitchen?  Clean up any spills right away.  Avoid walking on wet floors.  Keep items that you use a lot in easy-to-reach places.  If you need to reach something above you, use a strong step stool that has a grab bar.  Keep electrical cords out of the way.  Do not use floor polish or wax that makes floors slippery. If you must use wax, use non-skid floor wax.  Do not have throw rugs and other things on the floor that can make you trip. What can I do with my stairs?  Do not leave any items on the stairs.  Make sure that there are handrails on both  sides of the stairs and use them. Fix handrails that are broken or loose. Make sure that handrails are as long as the stairways.  Check any carpeting to make sure that it is firmly attached to the stairs. Fix any carpet that is loose or worn.  Avoid having throw rugs at the top or bottom of the stairs. If you do have throw rugs, attach them to the floor with carpet tape.  Make sure that you have a light switch at the top of the stairs and the bottom of the stairs. If you do not have them, ask someone to add them for you. What else can I do to help prevent falls?  Wear shoes that:  Do not have high heels.  Have rubber bottoms.  Are comfortable and fit you well.  Are closed at the toe. Do not wear sandals.  If you use a stepladder:  Make sure that it is fully opened. Do not climb a closed stepladder.  Make sure that both sides of the stepladder are locked into place.  Ask someone to hold it for you, if possible.  Clearly mark and make sure that you can see:  Any grab bars or handrails.  First and last steps.  Where the edge of each step is.  Use tools that help you move around (mobility aids) if they are needed. These include:  Canes.  Walkers.  Scooters.  Crutches.  Turn on the lights when you go into a dark area. Replace any light bulbs as soon as they burn out.  Set up your furniture so you have a clear path. Avoid moving your furniture around.  If any of your floors are uneven, fix them.  If there are any pets around you, be aware of where they are.  Review your medicines with your doctor. Some medicines can make you feel dizzy. This can increase your chance of falling. Ask your doctor what other things that you can do to help prevent falls. This information is not intended to replace advice given to you by your health care provider. Make sure you discuss any questions you have with your health care provider. Document Released: 11/15/2008 Document Revised:  06/27/2015 Document Reviewed: 02/23/2014 Elsevier Interactive Patient Education  2017 [employer].

## 2019-05-24 ENCOUNTER — Other Ambulatory Visit: Payer: Self-pay | Admitting: Student

## 2019-05-24 ENCOUNTER — Ambulatory Visit: Payer: Medicare Other

## 2019-05-24 ENCOUNTER — Other Ambulatory Visit: Payer: Self-pay | Admitting: Radiology

## 2019-05-25 ENCOUNTER — Telehealth (HOSPITAL_COMMUNITY): Payer: Self-pay | Admitting: *Deleted

## 2019-05-25 ENCOUNTER — Encounter (HOSPITAL_COMMUNITY): Payer: Self-pay

## 2019-05-25 ENCOUNTER — Ambulatory Visit (HOSPITAL_COMMUNITY): Payer: Medicare Other

## 2019-05-25 NOTE — Telephone Encounter (Signed)
Phone call to pt at her home. Advised pt that we can do her biopsy tomorrow morning at 0900. Informed pt to arrive at 0700 to admitting then to radiology nurses station. Pt is very appreciative of this information and verbalizes understanding of all instructions.

## 2019-05-25 NOTE — Telephone Encounter (Signed)
Pt arrived to appt today at 1255. Pt needed to arrived at 11am for procedure preparation. PT is very upset stating that "the person who called to give me appointment information told me to be here at 1pm for a 2pm appointment." Pt is not able to tell me the person of who called her with appointment information she states that it is on her calendar at home. Apologized to pt for this inconvenience and explained that we have a very full schedule this afternoon in radiology and we will have to reschedule her appt. Pt was understanding. Advised pt that I will attempt to get her appt rescheduled asap and I will call her this afternoon after 3pm allowing her time to get something to eat and get home to her home phone where she prefers to be called. Pt verbalized understanding and was appreciative.

## 2019-05-26 ENCOUNTER — Ambulatory Visit: Payer: Medicare Other

## 2019-05-26 ENCOUNTER — Other Ambulatory Visit: Payer: Self-pay | Admitting: Oncology

## 2019-05-26 ENCOUNTER — Other Ambulatory Visit: Payer: Self-pay

## 2019-05-26 ENCOUNTER — Ambulatory Visit (HOSPITAL_COMMUNITY)
Admission: RE | Admit: 2019-05-26 | Discharge: 2019-05-26 | Disposition: A | Discharge status: Home / Self Care | Payer: Medicare Other | Source: Ambulatory Visit | Attending: Oncology | Admitting: Oncology

## 2019-05-26 DIAGNOSIS — C50912 Malignant neoplasm of unspecified site of left female breast: Secondary | ICD-10-CM | POA: Insufficient documentation

## 2019-05-26 DIAGNOSIS — C50919 Malignant neoplasm of unspecified site of unspecified female breast: Secondary | ICD-10-CM

## 2019-05-26 DIAGNOSIS — Z853 Personal history of malignant neoplasm of breast: Secondary | ICD-10-CM | POA: Diagnosis not present

## 2019-05-26 DIAGNOSIS — C50211 Malignant neoplasm of upper-inner quadrant of right female breast: Secondary | ICD-10-CM

## 2019-05-26 DIAGNOSIS — R222 Localized swelling, mass and lump, trunk: Secondary | ICD-10-CM | POA: Diagnosis not present

## 2019-05-26 DIAGNOSIS — C4912 Malignant neoplasm of connective and soft tissue of left upper limb, including shoulder: Secondary | ICD-10-CM | POA: Diagnosis not present

## 2019-05-26 HISTORY — PX: IR US GUIDE BX ASP/DRAIN: IMG2392

## 2019-05-26 LAB — CBC
HCT: 38.9 % (ref 36.0–46.0)
Hemoglobin: 12.5 g/dL (ref 12.0–15.0)
MCH: 28.2 pg (ref 26.0–34.0)
MCHC: 32.1 g/dL (ref 30.0–36.0)
MCV: 87.6 fL (ref 80.0–100.0)
Platelets: 372 10*3/uL (ref 150–400)
RBC: 4.44 MIL/uL (ref 3.87–5.11)
RDW: 14.4 % (ref 11.5–15.5)
WBC: 8.1 10*3/uL (ref 4.0–10.5)
nRBC: 0 % (ref 0.0–0.2)

## 2019-05-26 LAB — PROTIME-INR
INR: 1.1 (ref 0.8–1.2)
Prothrombin Time: 14 seconds (ref 11.4–15.2)

## 2019-05-26 MED ORDER — LIDOCAINE HCL 1 % IJ SOLN
INTRAMUSCULAR | Status: AC
  Filled 2019-05-26: qty 20

## 2019-05-26 MED ORDER — FENTANYL CITRATE (PF) 100 MCG/2ML IJ SOLN
INTRAMUSCULAR | Status: DC | PRN
  Administered 2019-05-26 (×3): 25 ug via INTRAVENOUS

## 2019-05-26 MED ORDER — MIDAZOLAM HCL 2 MG/2ML IJ SOLN
INTRAMUSCULAR | Status: AC
  Filled 2019-05-26: qty 2

## 2019-05-26 MED ORDER — LIDOCAINE HCL 1 % IJ SOLN
INTRAMUSCULAR | Status: DC | PRN
  Administered 2019-05-26: 5 mL

## 2019-05-26 MED ORDER — FENTANYL CITRATE (PF) 100 MCG/2ML IJ SOLN
INTRAMUSCULAR | Status: AC
  Filled 2019-05-26: qty 2

## 2019-05-26 MED ORDER — SODIUM CHLORIDE 0.9 % IV SOLN: INTRAVENOUS | Status: DC

## 2019-05-26 MED ORDER — MIDAZOLAM HCL 2 MG/2ML IJ SOLN
INTRAMUSCULAR | Status: DC | PRN
  Administered 2019-05-26 (×3): 0.5 mg via INTRAVENOUS

## 2019-05-26 NOTE — Procedures (Signed)
Interventional Radiology Procedure Note  Procedure: US guided biopsy of left intrapectoral and subpectoral mass lesion  Complications: None  Estimated Blood Loss: None  Recommendations: - Bedrest x 1 yr - Path sent     Signed,  Criselda Peaches, MD

## 2019-05-26 NOTE — Sedation Documentation (Signed)
Pt discharged via w/c.  Verbal, written instructions given to pt and pt caregiver, verbalizes understanding

## 2019-05-26 NOTE — H&P (Signed)
Chief Complaint: Patient was seen in consultation today for left subpectoral chest wall mass biopsy.  Referring Physician(s): Chauncey Cruel  Supervising Physician: Jacqulynn Cadet  Patient Status: Stacey [name] - Out-pt  History of Present Illness: Stacey [name] is a 79 y.o. 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 Stacey [name] was followed regularly by oncology for several years ultimately being released about 3 years ago, however she began to develop left upper extremity lympedema and was seen by Dr. Jana Hakim on 3/25. She underwent a CT chest w/contrast on 05/09/19 to further evaluate this new complaint and she was noted to have a 3.1 x 5.1 cm left subpectoral chest wall mass compatible with cancer recurrence. IR has been asked to biopsy this mass to further guide care.  Stacey [name] states that she has been feeling well overall, she had some reflux last night after eating pita delite but this was resolved with Prilosec. She was under the impression that her breast would be biopsied today but after discussion understands that we will be biopsying an area in her chest. She is agreeable to proceed as planned.  Past Medical History:  Diagnosis Date   ANEMIA-NOS    ANXIETY    Breast cancer ([place]) 1997 L, 2012 R   s/p chemo/xrt   COPD    resolved   DIVERTICULOSIS, COLON 2008   Dizziness    GERD    Hx of radiation therapy 10/19/11 -12/03/11   right breast   HYPERLIPIDEMIA    IRRITABLE BOWEL SYNDROME, HX OF    Left-sided carotid artery disease ([facility])    moderate left ICA stenosis   OSTEOARTHRITIS, HAND    PSVT (paroxysmal supraventricular tachycardia) ([place])    symptomatic on event monitor    Past Surgical History:  Procedure Laterality Date   ABDOMINAL HYSTERECTOMY      APPENDECTOMY     BREAST BIOPSY  11/14/10    r breast: inv, insitu mammary carcinoma w/calcif, er/pr +, her2 -   BREAST SURGERY     lumpectomy   CATARACT EXTRACTION     both eyes   ELECTROPHYSIOLOGIC STUDY N/A 05/10/2015   Procedure: SVT Ablation;  Surgeon: Will Meredith Leeds, MD;  Location: [facility];  Service: Cardiovascular;  Laterality: N/A;   HERNIA REPAIR     inguinal herniorrhapy  1984   left   rectal fissure repair     s/p benign breast biopsy  2003   right   s/p left foot surgury  2009   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   spiral fx left foot  2008   no surgury   TMJ ARTHROPLASTY  1989   TONGUE SURGERY     1988- to remove scar tissue growth    TONSILLECTOMY      Allergies: Bee venom, Clindamycin/lincomycin, and Codeine  Medications: Prior to Admission medications   Medication Sig Start Date End Date Taking? Authorizing Provider  aspirin 81 MG EC tablet Take 81 mg by mouth daily.     Yes [provider]  calcium-vitamin D (OSCAL WITH D) 500-200 MG-UNIT per tablet Take 1 tablet by mouth daily.   Yes [provider]  cholecalciferol (VITAMIN D) 1000 UNITS tablet Take 1,000 Units by mouth daily.     Yes [provider]  clotrimazole-betamethasone (  LOTRISONE) cream Apply 1 application topically 2 (two) times daily as needed. 04/09/17  Yes Biagio Borg, MD  diclofenac sodium (VOLTAREN) 1 % GEL Apply 4 g topically 4 (four) times daily as needed. 10/20/17  Yes Biagio Borg, MD  diphenhydrAMINE (BENADRYL) 25 MG tablet Take 25 mg by mouth every 6 (six) hours as needed (for bee stings).    Yes [provider]  fluticasone (FLONASE) 50 MCG/ACT nasal spray Place 2 sprays into both nostrils daily.   Yes [provider]  loratadine (CLARITIN) 10 MG tablet Take 10 mg by mouth daily.   Yes [provider]  meloxicam (MOBIC) 15 MG tablet TAKE 1 TABLET(15 MG) BY MOUTH DAILY Annual appt due in June must see  provider for future refills 05/01/19  Yes Biagio Borg, MD  Multiple Vitamin (MULTIVITAMIN) capsule Take 1 capsule by mouth daily.     Yes [provider]  omeprazole (PRILOSEC) 20 MG capsule TAKE 1 CAPSULE(20 MG) BY MOUTH DAILY 04/25/19  Yes Biagio Borg, MD  polyethylene glycol [facility] / Floria Raveling) packet take 1 packet once daily if needed for constipation 09/18/16  Yes Biagio Borg, MD  pseudoephedrine (SUDAFED) 30 MG tablet Take 30 mg by mouth every 4 (four) hours as needed for congestion.    Yes [provider]  telmisartan (MICARDIS) 40 MG tablet Take 1 tablet (40 mg total) by mouth daily. 04/19/19  Yes Biagio Borg, MD  Tiotropium Bromide Monohydrate (SPIRIVA RESPIMAT) 2.5 MCG/ACT AERS Inhale 2 puffs into the lungs daily. 01/04/19  Yes Tanda Rockers, MD  zolpidem (AMBIEN) 10 MG tablet TAKE 1 TABLET(10 MG) BY MOUTH AT BEDTIME AS NEEDED 02/04/19  Yes Biagio Borg, MD     Family History  Problem Relation Age of Onset   Alcohol abuse Mother        ETOH dependence   Hypertension Mother    Stroke Mother    Colon polyps Mother    Diabetes Mother    Pancreatic cancer Mother 75   Uterine cancer Mother 87   Lymphoma Brother        burkitts   Lung cancer Paternal Uncle    Lung cancer Maternal Grandmother 15       non-smoker   Lung cancer Maternal Grandfather    Breast cancer Cousin        maternal cousin, dx in her mid 66s   Brain cancer Cousin        maternal cousin's son; dx in his 22s   Testicular cancer Cousin        maternal cousin's son;    Breast cancer Cousin        paternal cousin; dx in her 40s   Breast cancer Cousin        1 maternal, 2 paternal   Colon cancer Neg Hx    Esophageal cancer Neg Hx    Stomach cancer Neg Hx    Rectal cancer Neg Hx     Social History   Socioeconomic History   Marital status: Divorced    Spouse name: 2 Step-children   Number of children: 2   Years of education: Not on file   Highest education  level: Not on file  Occupational History   Occupation: retired Banker  Tobacco Use   Smoking status: Former Smoker    Packs/day: 1.00    Years: 54.00    Pack years: 54.00    Types: Cigarettes    Quit date: 01/03/2016  Years since quitting: 3.3   Smokeless tobacco: Never Used  Substance and Sexual Activity   Alcohol use: Yes    Comment: rare/ drinks socially   Drug use: No   Sexual activity: Never  Other Topics Concern   Not on file  Social History Narrative   Patient gets no regular exercise   No biological children   2 step children   Social Determinants of Health   Financial Resource Strain:    Difficulty of Paying Living Expenses:   Food Insecurity:    Worried About Charity fundraiser in the Last Year:    Arboriculturist in the Last Year:   Transportation Needs:    Film/video editor (Medical):    Lack of Transportation (Non-Medical):   Physical Activity:    Days of Exercise per Week:    Minutes of Exercise per Session:   Stress:    Feeling of Stress :   Social Connections:    Frequency of Communication with Friends and Family:    Frequency of Social Gatherings with Friends and Family:    Attends Religious Services:    Active Member of Clubs or Organizations:    Attends Archivist Meetings:    Marital Status:      Review of Systems: A 12 point ROS discussed and pertinent positives are indicated in the HPI above.  All other systems are negative.  Review of Systems  Constitutional: Negative for chills and fever.  Respiratory: Positive for shortness of breath (sometimes when walking long distances; hx COPD w/o supplemental oxygen use). Negative for cough.   Cardiovascular: Negative for chest pain.  Gastrointestinal: Negative for abdominal pain, nausea and vomiting.  Musculoskeletal: Negative for back pain.       (+) left upper extremity swelling  Skin: Negative for wound.  Neurological: Negative for headaches.     Vital Signs: BP (!) 144/82    Pulse 91    Temp 97.9 F (36.6 C) (Oral)    Resp 20    Ht 5' 3.75" (1.619 m)    Wt 165 lb (74.8 kg)    SpO2 96%    BMI 28.54 kg/m   Physical Exam Vitals reviewed.  Constitutional:      General: She is not in acute distress. HENT:     Head: Normocephalic.     Mouth/Throat:     Mouth: Mucous membranes are moist.     Pharynx: Oropharynx is clear. No oropharyngeal exudate or posterior oropharyngeal erythema.  Cardiovascular:     Rate and Rhythm: Normal rate and regular rhythm.  Pulmonary:     Effort: Pulmonary effort is normal.     Breath sounds: Normal breath sounds.  Chest:     Chest wall: No tenderness.  Abdominal:     General: There is no distension.     Palpations: Abdomen is soft.     Tenderness: There is no abdominal tenderness.  Musculoskeletal:     Comments: (+) LUE edema with compression sleeve in place  Skin:    General: Skin is warm and dry.  Neurological:     Mental Status: She is alert and oriented to person, place, and time.  Psychiatric:        Mood and Affect: Mood normal.        Behavior: Behavior normal.        Thought Content: Thought content normal.        Judgment: Judgment normal.      MD Evaluation Airway:  WNL Heart: WNL Abdomen: WNL Chest/ Lungs: WNL ASA  Classification: 2 Mallampati/Airway Score: One   Imaging: CT Chest W Contrast  Result Date: 05/09/2019 CLINICAL DATA:  Left breast cancer, diagnosed 1997 status post lumpectomy, with left arm lymphedema in 2017. Right breast cancer, diagnosed 2013, status post chemotherapy and XRT. Evaluate for recurrence. EXAM: CT CHEST WITH CONTRAST TECHNIQUE: Multidetector CT imaging of the chest was performed during intravenous contrast administration. CONTRAST:  59m OMNIPAQUE IOHEXOL 300 MG/ML  SOLN COMPARISON:  CT chest dated 03/06/2011 FINDINGS: Cardiovascular: Heart is normal in size. No pericardial effusion. No evidence of thoracic aortic aneurysm. Atherosclerotic  calcifications of the aortic arch. Mild three-vessel coronary atherosclerosis. Mediastinum/Nodes: Mild thoracic lymphadenopathy, including a 13 mm short axis AP window node (series 2/image 46), a 12 mm short axis right hilar node (series 2/image 59), and a 13 mm short axis subcarinal node (series 2/image 60), suspicious. Status post left axillary lymph node dissection. 3.1 x 5.1 cm left subpectoral chest wall mass (series 2/image 26). Extension anteriorly/superiorly (series 2/image 19). Associated 7 mm short axis left supraclavicular node (series 2/image 12). Lungs/Pleura: Biapical pleural-parenchymal scarring. Radiation changes at the right lung apex and in the anterior right upper lobe. Mild subpleural reticulation/fibrosis in the lungs bilaterally, lower lobe predominant. Mild centrilobular and paraseptal emphysematous changes, upper lung predominant. 5 mm triangular subpleural nodule in the anterior right middle lobe along the minor fissure (series 7/image 32), unchanged since 2013, benign. Additional 4 mm triangular subpleural nodule in the anterior right middle lobe (series 7/image 89), unchanged, benign. 3 mm right lower lobe nodule (series 7/image 108), possibly new. 4 x 6 mm nodule in the lateral right lower lobe (series 7/image 33) and 3 mm nodule in the lateral right lower lobe (series 7/image 87), new. Irregular/linear nodular opacity in the right upper lobe (series 7/image 45), new, although favoring mucous plugging. No focal consolidation. No pleural effusion or pneumothorax. Upper Abdomen: Visualized upper abdomen is notable for scattered hepatic cysts, a moderate hiatal hernia, and vascular calcifications. Musculoskeletal: Postsurgical changes related to prior lumpectomy in the medial right breast (series 2/image 78). Skin thickening overlying the left breast (series 2/image 100). Mild degenerative changes of the mid thoracic spine. IMPRESSION: 3.1 x 5.1 cm left subpectoral chest wall mass,  compatible with recurrence. Associated thoracic nodal metastases, as described above. Two small right lower lobe nodules are indeterminate although not overly suspicious for metastatic disease. Additional coronary nodules are likely benign. Additional postsurgical and post radiation changes, as above. Aortic Atherosclerosis (ICD10-I70.0) and Emphysema (ICD10-J43.9). Electronically Signed   By: SJulian HyM.D.   On: 05/09/2019 15:10   VAS [place]ER EXTREMITY VENOUS DUPLEX  Result Date: 04/27/2019 UPPER VENOUS STUDY  Indications: lymphedema Risk Factors: Cancer Breast. Comparison Study: No prior study on file Performing Technologist: CSharion DoveRVS  Examination Guidelines: A complete evaluation includes B-mode imaging, spectral Doppler, color Doppler, and power Doppler as needed of all accessible portions of each vessel. Bilateral testing is considered an integral part of a complete examination. Limited examinations for reoccurring indications may be performed as noted.  Right Findings: +----------+------------+---------+-----------+----------+-------+  RIGHT      Compressible Phasicity Spontaneous Properties Summary  +----------+------------+---------+-----------+----------+-------+  Subclavian                 Yes        Yes                         +----------+------------+---------+-----------+----------+-------+  Left Findings: +----------+------------+---------+-----------+----------+-------+  LEFT       Compressible Phasicity Spontaneous Properties Summary  +----------+------------+---------+-----------+----------+-------+  IJV            Full        Yes        Yes                         +----------+------------+---------+-----------+----------+-------+  Subclavian                 Yes        Yes                         +----------+------------+---------+-----------+----------+-------+  Axillary                   Yes        Yes                          +----------+------------+---------+-----------+----------+-------+  Brachial       Full        Yes        Yes                         +----------+------------+---------+-----------+----------+-------+  Cephalic       Full                                               +----------+------------+---------+-----------+----------+-------+  Basilic        Full                                               +----------+------------+---------+-----------+----------+-------+  Summary:  Right: No evidence of thrombosis in the subclavian.  Left: No evidence of deep vein thrombosis in the upper extremity. No evidence of superficial vein thrombosis in the upper extremity.  *See table(s) above for measurements and observations.  Diagnosing physician: Servando Snare MD Electronically signed by Servando Snare MD on 04/27/2019 at 4:37:52 PM.    Final     Labs:  CBC: Recent Labs    07/13/18 0938 05/09/19 1303 05/26/19 0720  WBC 7.8 7.8 8.1  HGB 11.9* 11.5* 12.5  HCT 35.1* 34.9* 38.9  PLT 283.0 290 372    COAGS: Recent Labs    05/26/19 0720  INR 1.1    BMP: Recent Labs    07/13/18 0938 05/09/19 1303  NA 143 144  K 3.6 3.6  CL 108 109  CO2 24 24  GLUCOSE 110* 107*  BUN 16 18  CALCIUM 8.5 8.6*  CREATININE 0.95 1.02*  GFRNONAA  --  52*  GFRAA  --  >60    LIVER FUNCTION TESTS: Recent Labs    07/13/18 0938 05/09/19 1303  BILITOT 0.4 0.4  AST 22 23  ALT 16 13  ALKPHOS 61 61  PROT 7.0 6.9  ALBUMIN 4.0 3.5    TUMOR MARKERS: No results for input(s): AFPTM, CEA, CA199, CHROMGRNA in the last 8760 hours.  Assessment and Plan:  79 y/o F wit history of bilateral breast cancer s/p chemotherapy and radiation who recently began to experience left upper extremity swelling of unknown  etiology, she underwent a CT chest for further evaluation and a 3.1 x 5.1 cm left subpectoral chest wall mass was noted. IR has been asked to perform a biopsy of this area to further direct care.  Patient has been NPO  since 11 pm, she does not take blood thinning medications. Afebrile, WBC 8.1, hgb 12.5, plt 372, INR 1.1  Risks and benefits of left subpectoral chest wall mass biopsy was discussed with the patient and/or patient's family including, but not limited to bleeding, infection, damage to adjacent structures or low yield requiring additional tests.  All of the questions were answered and there is agreement to proceed.  Consent signed and in chart.   Thank you for this interesting consult.  I greatly enjoyed meeting ARIEONA SWAGGERTY and look forward to participating in their care.  A copy of this report was sent to the requesting provider on this date.  Electronically Signed: Joaquim Nam, PA-C 05/26/2019, 8:34 AM   I spent a total of30 Minutes  in face to face in clinical consultation, greater than 50% of which was counseling/coordinating care for left subpectoral chest wall mass biopsy.

## 2019-05-30 ENCOUNTER — Encounter (HOSPITAL_COMMUNITY)
Admission: RE | Admit: 2019-05-30 | Discharge: 2019-05-30 | Disposition: A | Discharge status: Home / Self Care | Payer: Medicare Other | Source: Ambulatory Visit | Attending: Oncology | Admitting: Oncology

## 2019-05-30 ENCOUNTER — Other Ambulatory Visit: Payer: Self-pay

## 2019-05-30 DIAGNOSIS — K8020 Calculus of gallbladder without cholecystitis without obstruction: Secondary | ICD-10-CM | POA: Insufficient documentation

## 2019-05-30 DIAGNOSIS — I2510 Atherosclerotic heart disease of native coronary artery without angina pectoris: Secondary | ICD-10-CM | POA: Insufficient documentation

## 2019-05-30 DIAGNOSIS — C50211 Malignant neoplasm of upper-inner quadrant of right female breast: Secondary | ICD-10-CM | POA: Insufficient documentation

## 2019-05-30 DIAGNOSIS — D3501 Benign neoplasm of right adrenal gland: Secondary | ICD-10-CM | POA: Diagnosis not present

## 2019-05-30 DIAGNOSIS — C50919 Malignant neoplasm of unspecified site of unspecified female breast: Secondary | ICD-10-CM | POA: Diagnosis not present

## 2019-05-30 DIAGNOSIS — I700 Atherosclerosis of aorta: Secondary | ICD-10-CM | POA: Insufficient documentation

## 2019-05-30 DIAGNOSIS — J439 Emphysema, unspecified: Secondary | ICD-10-CM | POA: Insufficient documentation

## 2019-05-30 DIAGNOSIS — C50911 Malignant neoplasm of unspecified site of right female breast: Secondary | ICD-10-CM

## 2019-05-30 LAB — GLUCOSE, CAPILLARY: Glucose-Capillary: 95 mg/dL (ref 70–99)

## 2019-05-30 MED ORDER — FLUDEOXYGLUCOSE F - 18 (FDG) INJECTION
8.3000 | Freq: Once | INTRAVENOUS | Status: AC
  Administered 2019-05-30: 16:00:00 8.3 via INTRAVENOUS

## 2019-05-31 ENCOUNTER — Other Ambulatory Visit: Payer: Self-pay

## 2019-05-31 ENCOUNTER — Inpatient Hospital Stay: Payer: Medicare Other

## 2019-05-31 DIAGNOSIS — I890 Lymphedema, not elsewhere classified: Secondary | ICD-10-CM | POA: Diagnosis not present

## 2019-05-31 DIAGNOSIS — C7951 Secondary malignant neoplasm of bone: Secondary | ICD-10-CM | POA: Diagnosis not present

## 2019-05-31 DIAGNOSIS — C50211 Malignant neoplasm of upper-inner quadrant of right female breast: Secondary | ICD-10-CM | POA: Diagnosis not present

## 2019-05-31 DIAGNOSIS — Z170 Estrogen receptor positive status [ER+]: Secondary | ICD-10-CM | POA: Diagnosis not present

## 2019-05-31 DIAGNOSIS — R9389 Abnormal findings on diagnostic imaging of other specified body structures: Secondary | ICD-10-CM

## 2019-05-31 DIAGNOSIS — C50911 Malignant neoplasm of unspecified site of right female breast: Secondary | ICD-10-CM

## 2019-05-31 DIAGNOSIS — C7989 Secondary malignant neoplasm of other specified sites: Secondary | ICD-10-CM | POA: Diagnosis not present

## 2019-05-31 DIAGNOSIS — R222 Localized swelling, mass and lump, trunk: Secondary | ICD-10-CM | POA: Diagnosis not present

## 2019-05-31 LAB — COMPREHENSIVE METABOLIC PANEL
ALT: 15 U/L (ref 0–44)
AST: 24 U/L (ref 15–41)
Albumin: 3.4 g/dL — ABNORMAL LOW (ref 3.5–5.0)
Alkaline Phosphatase: 61 U/L (ref 38–126)
Anion gap: 8 (ref 5–15)
BUN: 15 mg/dL (ref 8–23)
CO2: 23 mmol/L (ref 22–32)
Calcium: 8.6 mg/dL — ABNORMAL LOW (ref 8.9–10.3)
Chloride: 110 mmol/L (ref 98–111)
Creatinine, Ser: 1.04 mg/dL — ABNORMAL HIGH (ref 0.44–1.00)
GFR calc Af Amer: 59 mL/min — ABNORMAL LOW (ref >60)
GFR calc non Af Amer: 51 mL/min — ABNORMAL LOW (ref >60)
Glucose, Bld: 117 mg/dL — ABNORMAL HIGH (ref 70–99)
Potassium: 3.6 mmol/L (ref 3.5–5.1)
Sodium: 141 mmol/L (ref 135–145)
Total Bilirubin: 0.4 mg/dL (ref 0.3–1.2)
Total Protein: 6.8 g/dL (ref 6.5–8.1)

## 2019-05-31 LAB — IRON AND TIBC
Iron: 63 ug/dL (ref 41–142)
Saturation Ratios: 20 % — ABNORMAL LOW (ref 21–57)
TIBC: 313 ug/dL (ref 236–444)
UIBC: 249 ug/dL (ref 120–384)

## 2019-05-31 LAB — CBC WITH DIFFERENTIAL/PLATELET
Abs Immature Granulocytes: 0.02 10*3/uL (ref 0.00–0.07)
Basophils Absolute: 0 10*3/uL (ref 0.0–0.1)
Basophils Relative: 0 %
Eosinophils Absolute: 0.2 10*3/uL (ref 0.0–0.5)
Eosinophils Relative: 3 %
HCT: 34.6 % — ABNORMAL LOW (ref 36.0–46.0)
Hemoglobin: 11.4 g/dL — ABNORMAL LOW (ref 12.0–15.0)
Immature Granulocytes: 0 %
Lymphocytes Relative: 15 %
Lymphs Abs: 1.1 10*3/uL (ref 0.7–4.0)
MCH: 28.2 pg (ref 26.0–34.0)
MCHC: 32.9 g/dL (ref 30.0–36.0)
MCV: 85.6 fL (ref 80.0–100.0)
Monocytes Absolute: 0.7 10*3/uL (ref 0.1–1.0)
Monocytes Relative: 9 %
Neutro Abs: 5.1 10*3/uL (ref 1.7–7.7)
Neutrophils Relative %: 73 %
Platelets: 282 10*3/uL (ref 150–400)
RBC: 4.04 MIL/uL (ref 3.87–5.11)
RDW: 14.4 % (ref 11.5–15.5)
WBC: 7 10*3/uL (ref 4.0–10.5)
nRBC: 0 % (ref 0.0–0.2)

## 2019-05-31 LAB — RETICULOCYTES
Immature Retic Fract: 12.8 % (ref 2.3–15.9)
RBC.: 4.09 MIL/uL (ref 3.87–5.11)
Retic Count, Absolute: 68.7 10*3/uL (ref 19.0–186.0)
Retic Ct Pct: 1.7 % (ref 0.4–3.1)

## 2019-05-31 LAB — CEA (IN HOUSE-CHCC): CEA (CHCC-In House): 1.43 ng/mL (ref 0.00–5.00)

## 2019-05-31 LAB — FOLATE: Folate: 34.1 ng/mL (ref >5.9)

## 2019-05-31 LAB — FERRITIN: Ferritin: 11 ng/mL (ref 11–307)

## 2019-05-31 LAB — VITAMIN B12: Vitamin B-12: 284 pg/mL (ref 180–914)

## 2019-06-01 LAB — CANCER ANTIGEN 27.29: CA 27.29: 101.4 U/mL — ABNORMAL HIGH (ref 0.0–38.6)

## 2019-06-01 LAB — SURGICAL PATHOLOGY

## 2019-06-01 NOTE — Progress Notes (Signed)
ID: [name]   DOB: Jun 08, 1940  MR#: [id number]  CBS#:[id number]  Patient Care Team: Biagio Borg, MD as PCP - General (Internal Medicine) Tyler Pita, MD as Consulting Physician (Radiation Oncology) Roseanne Kaufman, MD as Consulting Physician (Orthopedic Surgery) Lyndal Pulley, DO as Consulting Physician (Family Medicine) Emagene Merfeld, Virgie Dad, MD as Consulting Physician (Oncology) OTHER MD:   INTERVAL HISTORY:   Stacey [name] returns today for follow up of her progressive left upper extremity lymphedema.   Since her last visit, she underwent biopsy of the left chest mass on 05/26/2019. Pathology from the procedure ([id number]) confirmed mammary carcinoma. Prognostic panel was performed and showed estrogen receptor 100% positive and progesterone receptor 70% positive, both with strong staining intensity. Proliferation marker Ki-67 of 10%. Her2 negative by immunohistochemistry (1+).  She also underwent PET scan on 05/30/2019 showing: metastatic breast cancer as evidenced by hypermetabolic left pectoral/subpectoral mass with additional hypermetabolic left scapular metastasis.   REVIEW OF SYSTEMS: Stacey [name] continues to have pain in the left shoulder area.  This was felt to be likely a bursitis and possibly there is an element of bursitis but certainly she has involvement of the left scapular bone and very likely this is the real reason for her pain.  She continues to have significant left upper extremity swelling.  She is wearing a compression sleeve which is helping a little she says.  She has a very good functional status for her age, doing all her shopping, cooking, visits with friends etc.  She denies any unusual headaches visual changes dizziness nausea vomiting gait imbalance or falls.  A detailed review of systems today was otherwise stable   HISTORY OF PRESENT ILLNESS:  From the original intake note:  " Stacey [name] is a 79 year old [place] woman, who has a history of breast cancer  starting in 1997 and in 2013.    On 11/22/95, she underwent a left breast biopsy, which revealed malignant cells.  She had a left breast lumpectomy with re-excision on 11/29/95, with final pathology revealed IDC, grade 2, 2.5 cm, ER 98%, PR 97%, Ki67 8% with one of 15 nodes being positive.  She received chemotherapy, 4 cycles per patient, along with radiation and took Tamoxifen for 7 years following radiation completion.  Records from 1997 limited.    In October 2012, she had a biopsy of the right breast after mammography recommended additional images for a dense area in the right breast.  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  On 11/25/10, she had an MRI that measured the area in the right breast as 2.2 cm.  She started neoadjuvant [name] in November 2012 and has continued it since.  The area of concern measured 1.7 cm on a repeat MRI in March of 2013.  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  Surgical margins were negative with one of one sentinel lymph node positive for metastatic carcinoma.  She then underwent radiation therapy under the care of Dr. [name] from 10/19/11 to 12/03/11. "   Her subsequent history is as detailed below   PAST MEDICAL HISTORY: Past Medical History:  Diagnosis Date  . ANEMIA-NOS   . ANXIETY   . Breast cancer ([place]) 1997 L, 2012 R   s/p chemo/xrt  .  COPD    resolved  . DIVERTICULOSIS, COLON 2008  . Dizziness   . GERD   . Hx of radiation therapy 10/19/11 -12/03/11   right breast  . HYPERLIPIDEMIA   . IRRITABLE BOWEL SYNDROME, HX OF   . Left-sided carotid artery disease ([facility])    moderate left ICA stenosis  . OSTEOARTHRITIS, HAND   . PSVT (paroxysmal supraventricular  tachycardia) ([place])    symptomatic on event monitor    PAST SURGICAL HISTORY: Past Surgical History:  Procedure Laterality Date  . ABDOMINAL HYSTERECTOMY    . APPENDECTOMY    . BREAST BIOPSY  11/14/10    r breast: inv, insitu mammary carcinoma w/calcif, er/pr +, her2 -  . BREAST SURGERY     lumpectomy  . CATARACT EXTRACTION     both eyes  . ELECTROPHYSIOLOGIC STUDY N/A 05/10/2015   Procedure: SVT Ablation;  Surgeon: Will Meredith Leeds, MD;  Location: [facility];  Service: Cardiovascular;  Laterality: N/A;  . HERNIA REPAIR    . inguinal herniorrhapy  1984   left  . IR US GUIDE BX ASP/DRAIN  05/26/2019  . rectal fissure repair    . s/p benign breast biopsy  2003   right  . s/p left foot surgury  2009  . 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 spiral fx left foot  2008   no surgury  . TMJ ARTHROPLASTY  1989  . [place]- to remove scar tissue growth   . TONSILLECTOMY      FAMILY HISTORY Family History  Problem Relation Age of Onset  . Alcohol abuse Mother        ETOH dependence  . Hypertension Mother   . Stroke Mother   . Colon polyps Mother   . Diabetes Mother   . Pancreatic cancer Mother 30  . Uterine cancer Mother 45  . Lymphoma Brother        burkitts  . Lung cancer Paternal Uncle   . Lung cancer Maternal Grandmother 4       non-smoker  . Lung cancer Maternal Grandfather   . Breast cancer Cousin        maternal cousin, dx in her mid 4s  . Brain cancer Cousin        maternal cousin's son; dx in his 3s  . Testicular cancer Cousin        maternal cousin's son;   . Breast cancer Cousin        paternal cousin; dx in her 37s  . Breast cancer Cousin        1 maternal, 2 paternal  . Colon cancer Neg Hx   . Esophageal cancer Neg Hx   . Stomach cancer Neg Hx   . Rectal cancer Neg Hx     GYNECOLOGIC HISTORY:   Menarche age 31, GX P0 (one miscarriage at 5 months). Status post vaginal hysterectomy in the late 1970s, no  salpingo-oophorectomy    SOCIAL HISTORY:   The patient lives alone and is divorced. She retired from [employer], were she worked in Physiological scientist.Marland Kitchen     ADVANCED DIRECTIVES:  Living will in place; the patient's healthcare power of attorney is her stepdaughter, Vladimir Faster, who can be reached at [place]: Social History   Tobacco Use  . Smoking status: Former Smoker    Packs/day: 1.00    Years: 54.00    Pack years: 54.00    Types:  Cigarettes    Quit date: 01/03/2016    Years since quitting: 3.4  . Smokeless tobacco: Never Used  Substance Use Topics  . Alcohol use: Yes    Comment: rare/ drinks socially  . Drug use: No    Allergies  Allergen Reactions  . Bee Venom Swelling  . Clindamycin/Lincomycin Diarrhea and Nausea And Vomiting  . Codeine Nausea And Vomiting    Current Outpatient Medications  Medication Sig Dispense Refill  . anastrozole (ARIMIDEX) 1 MG tablet Take 1 tablet (1 mg total) by mouth daily. 90 tablet 4  . aspirin 81 MG EC tablet Take 81 mg by mouth daily.      . calcium-vitamin D (OSCAL WITH D) 500-200 MG-UNIT per tablet Take 1 tablet by mouth daily.    . cholecalciferol (VITAMIN D) 1000 UNITS tablet Take 1,000 Units by mouth daily.      . clotrimazole-betamethasone (LOTRISONE) cream Apply 1 application topically 2 (two) times daily as needed. 30 g 1  . diclofenac sodium (VOLTAREN) 1 % GEL Apply 4 g topically 4 (four) times daily as needed. 400 g 11  . diphenhydrAMINE (BENADRYL) 25 MG tablet Take 25 mg by mouth every 6 (six) hours as needed (for bee stings).     . fluticasone (FLONASE) 50 MCG/ACT nasal spray Place 2 sprays into both nostrils daily.    Marland Kitchen loratadine (CLARITIN) 10 MG tablet Take 10 mg by mouth daily.    . meloxicam (MOBIC) 15 MG tablet TAKE 1 TABLET(15 MG) BY MOUTH DAILY Annual appt due in June must see provider for future refills 90 tablet 0  . Multiple Vitamin (MULTIVITAMIN) capsule Take 1 capsule by mouth daily.      Marland Kitchen  omeprazole (PRILOSEC) 20 MG capsule TAKE 1 CAPSULE(20 MG) BY MOUTH DAILY 90 capsule 3  . palbociclib (IBRANCE) 125 MG capsule Take 1 capsule (125 mg total) by mouth daily with breakfast. Take whole with food. Take for 21 days on, 7 days off, repeat every 28 days. 21 capsule 6  . polyethylene glycol (MIRALAX / GLYCOLAX) packet take 1 packet once daily if needed for constipation 30 packet 11  . pseudoephedrine (SUDAFED) 30 MG tablet Take 30 mg by mouth every 4 (four) hours as needed for congestion.     Marland Kitchen telmisartan (MICARDIS) 40 MG tablet Take 1 tablet (40 mg total) by mouth daily. 90 tablet 3  . Tiotropium Bromide Monohydrate (SPIRIVA RESPIMAT) 2.5 MCG/ACT AERS Inhale 2 puffs into the lungs daily. 4 g 11  . zolpidem (AMBIEN) 10 MG tablet TAKE 1 TABLET(10 MG) BY MOUTH AT BEDTIME AS NEEDED 90 tablet 1   No current facility-administered medications for this visit.    OBJECTIVE: White woman who appears stated age 60:   06/02/19 0948  BP: (!) 144/64  Pulse: 99  Resp: 18  Temp: 98.9 F (37.2 C)  SpO2: 94%     Body mass index is 29.36 kg/m.     Sclerae unicteric, EOMs intact Wearing a mask No cervical or supraclavicular adenopathy Lungs no rales or rhonchi Heart regular rate and rhythm Abd soft, nontender, positive bowel sounds MSK kyphosis but no focal spinal tenderness, grade 3 left upper extremity lymphedema, sleeve in place Neuro: nonfocal, well oriented, appropriate affect Breasts: Deferred   LAB RESULTS: Lab Results  Component Value Date   WBC 7.0 05/31/2019   NEUTROABS 5.1 05/31/2019   HGB 11.4 (L) 05/31/2019   HCT 34.6 (L) 05/31/2019   MCV 85.6 05/31/2019   PLT 282 05/31/2019  Chemistry      Component Value Date/Time   NA 141 05/31/2019 1223   NA 140 12/09/2015 1409   K 3.6 05/31/2019 1223   K 4.2 12/09/2015 1409   CL 110 05/31/2019 1223   CL 107 02/02/2012 0939   CO2 23 05/31/2019 1223   CO2 25 12/09/2015 1409   BUN 15 05/31/2019 1223   BUN 16.7  12/09/2015 1409   CREATININE 1.04 (H) 05/31/2019 1223   CREATININE 1.02 (H) 05/09/2019 1303   CREATININE 0.8 12/09/2015 1409      Component Value Date/Time   CALCIUM 8.6 (L) 05/31/2019 1223   CALCIUM 9.4 12/09/2015 1409   ALKPHOS 61 05/31/2019 1223   ALKPHOS 54 12/09/2015 1409   AST 24 05/31/2019 1223   AST 23 05/09/2019 1303   AST 20 12/09/2015 1409   ALT 15 05/31/2019 1223   ALT 13 05/09/2019 1303   ALT 13 12/09/2015 1409   BILITOT 0.4 05/31/2019 1223   BILITOT 0.4 05/09/2019 1303   BILITOT <0.22 12/09/2015 1409       Lab Results  Component Value Date   LABCA2 56 (H) 12/10/2010    No components found for: [id number]  No results for input(s): INR in the last 168 hours.  Urinalysis    Component Value Date/Time   COLORURINE YELLOW 07/13/2018 0938   APPEARANCEUR [place] (A) 07/13/2018 0938   LABSPEC 1.020 07/13/2018 0938   PHURINE 6.0 07/13/2018 0938   GLUCOSEU NEGATIVE 07/13/2018 0938   HGBUR NEGATIVE 07/13/2018 0938   BILIRUBINUR NEGATIVE 07/13/2018 0938   KETONESUR NEGATIVE 07/13/2018 0938   UROBILINOGEN 0.2 07/13/2018 0938   NITRITE NEGATIVE 07/13/2018 0938   LEUKOCYTESUR TRACE (A) 07/13/2018 0938    STUDIES: CT Chest W Contrast  Result Date: 05/09/2019 CLINICAL DATA:  Left breast cancer, diagnosed 1997 status post lumpectomy, with left arm lymphedema in 2017. Right breast cancer, diagnosed 2013, status post chemotherapy and XRT. Evaluate for recurrence. EXAM: CT CHEST WITH CONTRAST TECHNIQUE: Multidetector CT imaging of the chest was performed during intravenous contrast administration. CONTRAST:  78m OMNIPAQUE IOHEXOL 300 MG/ML  SOLN COMPARISON:  CT chest dated 03/06/2011 FINDINGS: Cardiovascular: Heart is normal in size. No pericardial effusion. No evidence of thoracic aortic aneurysm. Atherosclerotic calcifications of the aortic arch. Mild three-vessel coronary atherosclerosis. Mediastinum/Nodes: Mild thoracic lymphadenopathy, including a 13 mm short axis AP  window node (series 2/image 46), a 12 mm short axis right hilar node (series 2/image 59), and a 13 mm short axis subcarinal node (series 2/image 60), suspicious. Status post left axillary lymph node dissection. 3.1 x 5.1 cm left subpectoral chest wall mass (series 2/image 26). Extension anteriorly/superiorly (series 2/image 19). Associated 7 mm short axis left supraclavicular node (series 2/image 12). Lungs/Pleura: Biapical pleural-parenchymal scarring. Radiation changes at the right lung apex and in the anterior right upper lobe. Mild subpleural reticulation/fibrosis in the lungs bilaterally, lower lobe predominant. Mild centrilobular and paraseptal emphysematous changes, upper lung predominant. 5 mm triangular subpleural nodule in the anterior right middle lobe along the minor fissure (series 7/image 32), unchanged since 2013, benign. Additional 4 mm triangular subpleural nodule in the anterior right middle lobe (series 7/image 89), unchanged, benign. 3 mm right lower lobe nodule (series 7/image 108), possibly new. 4 x 6 mm nodule in the lateral right lower lobe (series 7/image 33) and 3 mm nodule in the lateral right lower lobe (series 7/image 87), new. Irregular/linear nodular opacity in the right upper lobe (series 7/image 45), new, although favoring mucous plugging. No focal  consolidation. No pleural effusion or pneumothorax. Upper Abdomen: Visualized upper abdomen is notable for scattered hepatic cysts, a moderate hiatal hernia, and vascular calcifications. Musculoskeletal: Postsurgical changes related to prior lumpectomy in the medial right breast (series 2/image 78). Skin thickening overlying the left breast (series 2/image 100). Mild degenerative changes of the mid thoracic spine. IMPRESSION: 3.1 x 5.1 cm left subpectoral chest wall mass, compatible with recurrence. Associated thoracic nodal metastases, as described above. Two small right lower lobe nodules are indeterminate although not overly suspicious  for metastatic disease. Additional coronary nodules are likely benign. Additional postsurgical and post radiation changes, as above. Aortic Atherosclerosis (ICD10-I70.0) and Emphysema (ICD10-J43.9). Electronically Signed   By: Julian Hy M.D.   On: 05/09/2019 15:10   NM PET Image Initial (PI) Skull Base To Thigh  Result Date: 05/31/2019 CLINICAL DATA:  Initial treatment strategy for recurrent breast cancer. EXAM: NUCLEAR MEDICINE PET SKULL BASE TO THIGH TECHNIQUE: 8.2 mCi F-18 FDG was injected intravenously. Full-ring PET imaging was performed from the skull base to thigh after the radiotracer. CT data was obtained and used for attenuation correction and anatomic localization. Fasting blood glucose: 95 mg/dl COMPARISON:  CT chest 05/09/2019 and CT abdomen pelvis 02/09/2013. FINDINGS: Mediastinal blood pool activity: SUV max 2.7 Liver activity: SUV max NA NECK: No abnormal hypermetabolism. Incidental CT findings: None. CHEST: Hypermetabolic prevascular, paratracheal, subcarinal and hilar lymph nodes. Index low left paratracheal lymph node measures 1.5 cm (4/46) with an SUV max of 12.0. Index subcarinal lymph node measures 1.3 cm with an SUV max of 12.8. There is a hypermetabolic left pectoral/subpectoral mass, measuring 2.4 x 5.4 cm (SUV max 7.1) with additional foci of hypermetabolism in and adjacent to the left pectoralis musculature. No hypermetabolic right axillary lymph nodes. No hypermetabolic pulmonary nodules. Incidental CT findings: Atherosclerotic calcification of the aorta and coronary arteries. Heart is mildly enlarged. No pericardial pleural effusion. Centrilobular emphysema. Extensive respiratory motion artifact. 5 mm nodule along the minor fissure (8/31) is too small for PET resolution but stable from 05/09/2019. There may be changes of subpleural radiation fibrosis in the anterior right lung. A 3.2 cm fluid collection in the medial right breast is unchanged from 05/09/2019 and likely a  postoperative seroma. No associated hypermetabolism. ABDOMEN/PELVIS: No abnormal hypermetabolism in the liver, adrenal glands, spleen or pancreas. No hypermetabolic lymph nodes. Incidental CT findings: Low-attenuation lesions in the liver measure up to 2.8 cm in the left hepatic lobe, similar. A stone is seen in the gallbladder. Fluid density right adrenal nodule measures 10 mm. Left adrenal gland, kidneys, spleen, pancreas, stomach and bowel are unremarkable. Atherosclerotic calcification of the aorta without aneurysm. SKELETON: Hypermetabolic mixed lytic and sclerotic lesion in the lateral left scapula, near the glenoid fossa, measures approximately 1.7 x 2.9 cm (4/42, SUV max 3.4), new from 03/06/2011. No additional areas of abnormal hypermetabolism. Incidental CT findings: Degenerative changes in the lumbar spine. IMPRESSION: 1. Metastatic breast cancer as evidenced by hypermetabolic left pectoral/subpectoral mass with additional hypermetabolic lesions along the anterior left chest wall, hypermetabolic mediastinal/hilar adenopathy and hypermetabolic left scapular metastasis. 2. Cholelithiasis. 3. Right adrenal adenoma. 4. Aortic atherosclerosis (ICD10-I70.0). Coronary artery calcification. 5.  Emphysema (ICD10-J43.9). Electronically Signed   By: Lorin Picket M.D.   On: 05/31/2019 09:29   IR US Guide Bx Asp/Drain  Result Date: 05/26/2019 INDICATION: 79 year old female with a history of bilateral breast cancer. She has recently developed left upper extremity lymphedema and CT imaging demonstrates an infiltrative mass surrounding the left neurovascular bundle in extending  into the subpectoral space. She presents for ultrasound-guided core biopsy of the same. EXAM: Ultrasound-guided core biopsy MEDICATIONS: None. ANESTHESIA/SEDATION: Moderate (conscious) sedation was employed during this procedure. A total of Versed 1.5 mg and Fentanyl 75 mcg was administered intravenously. Moderate Sedation Time: 19 minutes.  The patient's level of consciousness and vital signs were monitored continuously by radiology nursing throughout the procedure under my direct supervision. FLUOROSCOPY TIME:  None COMPLICATIONS: None immediate. PROCEDURE: Informed written consent was obtained from the patient after a thorough discussion of the procedural risks, benefits and alternatives. All questions were addressed. Maximal Sterile Barrier Technique was utilized including caps, mask, sterile gowns, sterile gloves, sterile drape, hand hygiene and skin antiseptic. A timeout was performed prior to the initiation of the procedure. Ultrasound was used to interrogate the left upper chest and infraclavicular fossa. There is an ill-defined hypoechoic solid mass in the subpectoral space surrounding multiple small vascular structures. Additionally, there are multiple circumscribed hypoechoic solid mass is noted intramuscularly within the pectoralis muscle itself. A suitable skin entry site was selected and marked. The overlying skin was sterilely prepped and draped in the standard fashion using chlorhexidine skin prep. Local anesthesia was attained by infiltration with 1% lidocaine. A small dermatotomy was made. Under real-time ultrasound guidance, 18 gauge core biopsies were obtained both of the subpectoral mass lesion and the intra pectoral nodule. Biopsy specimens were placed in formalin and delivered to pathology for further analysis. Post biopsy ultrasound imaging demonstrates no evidence of immediate complication. The patient tolerated the procedure well. IMPRESSION: Successful ultrasound-guided core biopsy of subpectoral and intra pectoral soft tissue lesions. Signed, Criselda Peaches, MD, [facility] Electronically Signed   By: Jacqulynn Cadet M.D.   On: 05/26/2019 16:17     ASSESSMENT: 79 y.o. [place] woman:  LEFT BREAST #1  S/P LEFT breast lumpectomy with re-excision on  11/29/95 for a T2 N1bi stage IIb invasive ductal carcinoma , grade 2, estrogen receptor 98% positive, progesterone receptor 97% positive, Ki67 8%.  #2 status post 4 cycles of doxorubicin and cyclophosphamide,    (i) followed by radiation therapy under the care of Dr. [name].  #3 received tamoxifen for a total of seven years   RIGHT BREAST #4  S/P biopsy of the RIGHT breast upper inner quadrant on 11/14/10 showing invasive ductal carcinoma,, grade 2, estrogen receptor 85% and progesterone receptor 57% positive, Ki67 20%, HER2 not amplified.    #5 started neoadjuvant letrozole in November 2012; switched to tamoxifen as of February 2016 due to osteopenia concerns  #6  S/P right lumpectomy with sentinel lymph node biopsy on 09/03/11 for a ypT2, ypN1a, stage IIB invasive lobular carcinoma, grade 2,estrogen receptor 97% positive, progesterone receptor 12% positive, with no HER-2 amplification.  #7 status post right breast radiation therapy under the care of Dr. [name] from 10/19/2011 to 12/03/2011.   #8 did not meet criteria for genetic testing according to her insurance company.  #9 osteopenia, with a T score of -1.8 on DEXA scan [facility] 03/07/2013  (a) status post multiple dental extractions and implants  (b) repeat bone density at [facility] 04/02/2015 shows a T score of -2.0  METASTATIC DISEASE: April 2021 #10:  left upper extremity lymphedema led to chest CT scan 05/09/2019 showing a 5.1 cm left subpectoral chest wall mass and thoracic lymphadenopathy   (a) CT biopsy of the chest wall mass 05/25/2019 confirms recurrent breast cancer, strongly estrogen and progesterone receptor positive, HER-2 not amplified  (b)  PET scan 05/30/2019 shows a left subpectoral mass measuring 5.4 cm, with significant regional and mediastinal adenopathy, sclerotic left scapular metastasis, but no liver or lung involvement.  (c) CA 27-29 is informative  #11 anastrozole started 06/02/2019; palbociclib to be added  06/08/2019  #12 denosumab/Xgeva starting 06/08/2019, to be repeated every 28 days  #13 genetics testing pending   PLAN:   Stacey [name] has what most likely is recurrent disease in the left chest wall.  Possibly this could be a new tumor.  In any case it is slow-growing, strongly estrogen and progesterone receptor positive, and it is metastatic, with extensive nodal involvement and bone metastatic disease.  The good news is that we do not see any liver or lung involvement.  She understands that stage IV breast cancer is not curable with our current knowledge base. The goal of treatment is control. The strategy of treatment is to do only the minimum necessary to control the growth of the tumor so that the patient can have as normal a life as possible. There is no survival advantage in treating aggressively if treating less aggressively results in tumor control. With this strategy stage IV breast cancer in many cases can function as a "chronic illness": something that cannot be quite gotten rid of but can be controlled for an indefinite period of time  Symptomatically the big problems she is having are pain in the left shoulder area which may well be due to her left scapular involvement, and left upper extremity lymphedema.  She is going to continue to work with physical therapy regarding the lymphedema and I am referring her back to radiation oncology for consideration of palliative radiation to the left scapular area.  With 2 separate breast cancers, possibly 3, and a positive family history for breast cancer, she qualifies for genetics testing and she wishes to pursue this possibility.  I have entered the appropriate request.  Treatment will consist of anastrozole, palbociclib, and denosumab/Xgeva.  Today we discussed the possible toxicities side effects and complications of these various agents including the rare concern regarding osteonecrosis of the jaw with denosumab/Xgeva, concerns regarding  hypocalcemia, and issues regarding a low counts with palbociclib.  All the appropriate orders have been entered and I am hoping she can start therapy 06/08/2019.  She will see [place] that day  From there we will follow her on a monthly basis until we have evidence of response after which we will start broadening the follow-up interval  I have updated her advanced directives today  Encounter time 45 minutes.Virgie Dad Kym Scannell    06/02/2019 Oncology and Hematology [facility] [place], [place] [address] Tel. [phone]  Joylene Igo [phone]   I, Wilburn Mylar, am acting as scribe for Dr. Virgie Dad. [name] Buren.  I, Lurline Del MD, have reviewed the above documentation for accuracy and completeness, and I agree with the above.    *Total Encounter Time as defined by the Centers for Medicare and Medicaid Services includes, in addition to the face-to-face time of a patient visit (documented in the note above) non-face-to-face time: obtaining and reviewing outside history, ordering and reviewing medications, tests or procedures, care coordination (communications with other health care professionals or caregivers) and documentation in the medical record.

## 2019-06-02 ENCOUNTER — Other Ambulatory Visit: Payer: Self-pay

## 2019-06-02 ENCOUNTER — Inpatient Hospital Stay (HOSPITAL_BASED_OUTPATIENT_CLINIC_OR_DEPARTMENT_OTHER): Payer: Medicare Other | Admitting: Oncology

## 2019-06-02 VITALS — BP 144/64 | HR 99 | Temp 98.9°F | Resp 18 | Ht 63.75 in | Wt 169.7 lb

## 2019-06-02 DIAGNOSIS — C50912 Malignant neoplasm of unspecified site of left female breast: Secondary | ICD-10-CM | POA: Diagnosis not present

## 2019-06-02 DIAGNOSIS — R222 Localized swelling, mass and lump, trunk: Secondary | ICD-10-CM | POA: Diagnosis not present

## 2019-06-02 DIAGNOSIS — C7989 Secondary malignant neoplasm of other specified sites: Secondary | ICD-10-CM | POA: Diagnosis not present

## 2019-06-02 DIAGNOSIS — I890 Lymphedema, not elsewhere classified: Secondary | ICD-10-CM | POA: Diagnosis not present

## 2019-06-02 DIAGNOSIS — Z170 Estrogen receptor positive status [ER+]: Secondary | ICD-10-CM | POA: Diagnosis not present

## 2019-06-02 DIAGNOSIS — C50211 Malignant neoplasm of upper-inner quadrant of right female breast: Secondary | ICD-10-CM | POA: Diagnosis not present

## 2019-06-02 DIAGNOSIS — C7951 Secondary malignant neoplasm of bone: Secondary | ICD-10-CM | POA: Diagnosis not present

## 2019-06-02 DIAGNOSIS — C50212 Malignant neoplasm of upper-inner quadrant of left female breast: Secondary | ICD-10-CM | POA: Diagnosis not present

## 2019-06-02 MED ORDER — PALBOCICLIB 125 MG PO CAPS: 125.0000 mg | ORAL_CAPSULE | Freq: Every day | ORAL | 6 refills | Status: DC

## 2019-06-02 MED ORDER — ANASTROZOLE 1 MG PO TABS
1.0000 mg | ORAL_TABLET | Freq: Every day | ORAL | 4 refills | Status: DC
Start: 2019-06-02 — End: 2020-06-28

## 2019-06-05 ENCOUNTER — Telehealth: Payer: Self-pay | Admitting: Adult Health

## 2019-06-05 ENCOUNTER — Telehealth: Payer: Self-pay

## 2019-06-05 NOTE — Telephone Encounter (Signed)
Oral Oncology Patient Advocate Encounter  Received notification from [employer] that prior authorization for Leslee Home is required.  PA submitted on CoverMyMeds Key [id number] Status is pending  Oral Oncology Clinic will continue to follow.  [place] Patient [place] Phone [phone] Fax [phone] 06/05/2019 8:54 AM

## 2019-06-05 NOTE — Telephone Encounter (Signed)
Oral Oncology Patient Advocate Encounter  Prior Authorization for Stacey [name] has been approved.    PA# [id number] Effective dates: 06/05/19 through 02/02/20  Patients co-pay is $2352.27  Oral Oncology Clinic will continue to follow.   [place] Patient Stacey [name] Phone [phone] Fax [phone] 06/05/2019 9:22 AM

## 2019-06-05 NOTE — Telephone Encounter (Signed)
Added appts per 4/30 los.

## 2019-06-06 ENCOUNTER — Telehealth: Payer: Self-pay | Admitting: Pharmacist

## 2019-06-06 DIAGNOSIS — Z170 Estrogen receptor positive status [ER+]: Secondary | ICD-10-CM

## 2019-06-06 DIAGNOSIS — C50212 Malignant neoplasm of upper-inner quadrant of left female breast: Secondary | ICD-10-CM

## 2019-06-06 MED ORDER — PALBOCICLIB 125 MG PO TABS: 125.0000 mg | ORAL_TABLET | Freq: Every day | ORAL | 0 refills | Status: DC

## 2019-06-06 NOTE — Progress Notes (Signed)
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 Metastatic breast cancer as evidenced by hypermetabolic left pectoral/subpectoral mass with additional hypermetabolic lesions along the anterior left chest wall, hypermetabolic mediastinal/hilar adenopathy and hypermetabolic left scapular metastasis. 2. Cholelithiasis. 3. Right adrenal adenoma. 4. Aortic atherosclerosis (ICD10-I70.0). Coronary artery calcification. 5.  Emphysema (ICD10-J43.9).  Location(s) of Symptomatic Metastases:  LEFT shoulder/scapula   Past/Anticipated chemotherapy by medical oncology, if any:  Under care of Dr. 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 #2 status post 4 cycles of doxorubicin and cyclophosphamide,      (i) followed by radiation therapy under the care of Dr. [name]. #3 received tamoxifen for a total of seven years  RIGHT BREAST #4  S/P biopsy of the RIGHT breast upper inner quadrant on 11/14/10 showing invasive ductal carcinoma,, grade 2, estrogen receptor 85% and progesterone receptor 57% positive, Ki67 20%, HER2 not amplified.   #5 started neoadjuvant letrozole in November 2012; switched to tamoxifen as of February 2016 due to osteopenia concerns #6  S/P right lumpectomy with sentinel lymph node biopsy on 09/03/11 for a ypT2, ypN1a, stage IIB invasive lobular carcinoma, grade 2,estrogen receptor 97% positive, progesterone receptor 12% positive, with no HER-2 amplification. #7 status post right breast radiation therapy under the care of Dr. [name] from 10/19/2011 to  12/03/2011.  #8 did not meet criteria for genetic testing according to her insurance company. #9 osteopenia, with a T score of -1.8 on DEXA scan at [facility] 03/07/2013             (a) status post multiple dental extractions and implants             (b) repeat bone density at [facility] 04/02/2015 shows a T score of -2.0 METASTATIC DISEASE: April 2021 #10:  left upper extremity lymphedema led to chest CT scan 05/09/2019 showing a 5.1 cm left subpectoral chest wall mass and thoracic lymphadenopathy              (a) CT biopsy of the chest wall mass 05/25/2019 confirms recurrent breast cancer, strongly estrogen and progesterone receptor positive, HER-2 not amplified             (b) PET scan 05/30/2019 shows a left subpectoral mass measuring 5.4 cm, with significant regional and mediastinal adenopathy, sclerotic left scapular metastasis, but no liver or lung involvement.             (c) CA 27-29 is informative #11 anastrozole started 06/02/2019; palbociclib to be added 06/08/2019 #12 denosumab/Xgeva starting 06/08/2019, to be repeated every 28 days #13 genetics testing pending Patient will F/U again with Dr. Jana Hakim on 06/08/2019  Pain on a scale of 0-10 is: Main source of discomfort is lymphedema along left arm. She states 3 fingers on her left hand are numb, and then the swelling goes from her wrist all the way up her left arm/shoulder. She has intermittent discomfort to left shoulder blade (which she was receiving cortisone injections to manage) but it does not trouble her like the weight/discomfort of the lymphedema in the left arm. She wore a compression sleeve for about 3 years but it eventually  stopped providing relief. She went to the lymphedema clinic in January of this year but the therapist was not able to to improve swelling, and recommended she follow up with Dr. Jana Hakim (thus spurring the scans that found metastatic disease)  Ambulatory status? Walker? Wheelchair?: Ambulatory without  assistance  SAFETY ISSUES:  Prior radiation? Yes: LEFT breast in 1997 under care of Dr. [name] and RIGHT breast 10/19/2011-12/03/2011 under care of Dr. Tyler Pita  Pacemaker/ICD? No  Possible current pregnancy? N/A-hysterectomy  Is the patient on methotrexate? No  Current Complaints / other details:   She is worried about financial burden for oral chemo regimen, but is grateful for assistance/grant from our oral chemo navigator.

## 2019-06-06 NOTE — Telephone Encounter (Signed)
Oral Oncology Pharmacist Encounter  Received new prescription for Ibrance (palbociclib) for the treatment of metastatic breast cancer in conjunction with anastrazole, planned duration until disease progression or unacceptable drug toxicity. Planned start 06/08/19.  CMP from 05/31/19 assessed, no relevant lab abnormalities. Prescription dose and frequency assessed.   Current medication list in Epic reviewed, no DDIs with palbociclib identified.  Prescription has been e-scribed to the [facility] for benefits analysis and approval.  Oral Oncology Clinic will continue to follow for insurance authorization, copayment issues, initial counseling and start date.  Darl Pikes, PharmD, BCPS, BCOP, CPP Hematology/Oncology Clinical Pharmacist [facility] [phone]  06/06/2019 2:16 PM

## 2019-06-06 NOTE — Telephone Encounter (Signed)
Oral Chemotherapy Pharmacist Encounter   Attempted to call Ms. Rollene Rotunda for [place] patient education. No answer LVM for patient to call be back.  Darl Pikes, PharmD, BCPS, BCOP, CPP Hematology/Oncology Clinical Pharmacist [facility] [place] [facility] [phone]  06/06/2019 4:16 PM

## 2019-06-07 ENCOUNTER — Ambulatory Visit
Admission: RE | Admit: 2019-06-07 | Discharge: 2019-06-07 | Disposition: A | Discharge status: Home / Self Care | Payer: Medicare Other | Source: Ambulatory Visit | Attending: Radiation Oncology | Admitting: Radiation Oncology

## 2019-06-07 ENCOUNTER — Encounter: Payer: Self-pay | Admitting: Radiation Oncology

## 2019-06-07 ENCOUNTER — Other Ambulatory Visit: Payer: Self-pay

## 2019-06-07 DIAGNOSIS — C7951 Secondary malignant neoplasm of bone: Secondary | ICD-10-CM

## 2019-06-07 DIAGNOSIS — Z853 Personal history of malignant neoplasm of breast: Secondary | ICD-10-CM | POA: Diagnosis not present

## 2019-06-07 DIAGNOSIS — Z923 Personal history of irradiation: Secondary | ICD-10-CM | POA: Diagnosis not present

## 2019-06-07 DIAGNOSIS — Z79899 Other long term (current) drug therapy: Secondary | ICD-10-CM | POA: Insufficient documentation

## 2019-06-07 DIAGNOSIS — M25512 Pain in left shoulder: Secondary | ICD-10-CM | POA: Insufficient documentation

## 2019-06-07 DIAGNOSIS — I700 Atherosclerosis of aorta: Secondary | ICD-10-CM | POA: Insufficient documentation

## 2019-06-07 DIAGNOSIS — Z7982 Long term (current) use of aspirin: Secondary | ICD-10-CM | POA: Insufficient documentation

## 2019-06-07 DIAGNOSIS — D649 Anemia, unspecified: Secondary | ICD-10-CM | POA: Insufficient documentation

## 2019-06-07 DIAGNOSIS — K219 Gastro-esophageal reflux disease without esophagitis: Secondary | ICD-10-CM | POA: Insufficient documentation

## 2019-06-07 DIAGNOSIS — C50912 Malignant neoplasm of unspecified site of left female breast: Secondary | ICD-10-CM | POA: Insufficient documentation

## 2019-06-07 DIAGNOSIS — E785 Hyperlipidemia, unspecified: Secondary | ICD-10-CM | POA: Insufficient documentation

## 2019-06-07 DIAGNOSIS — C778 Secondary and unspecified malignant neoplasm of lymph nodes of multiple regions: Secondary | ICD-10-CM | POA: Insufficient documentation

## 2019-06-07 DIAGNOSIS — Z510 Encounter for antineoplastic radiation therapy: Secondary | ICD-10-CM | POA: Insufficient documentation

## 2019-06-07 DIAGNOSIS — G893 Neoplasm related pain (acute) (chronic): Secondary | ICD-10-CM | POA: Diagnosis not present

## 2019-06-07 DIAGNOSIS — F419 Anxiety disorder, unspecified: Secondary | ICD-10-CM | POA: Insufficient documentation

## 2019-06-07 DIAGNOSIS — I471 Supraventricular tachycardia: Secondary | ICD-10-CM | POA: Insufficient documentation

## 2019-06-07 DIAGNOSIS — I890 Lymphedema, not elsewhere classified: Secondary | ICD-10-CM | POA: Insufficient documentation

## 2019-06-07 DIAGNOSIS — J449 Chronic obstructive pulmonary disease, unspecified: Secondary | ICD-10-CM | POA: Insufficient documentation

## 2019-06-07 DIAGNOSIS — F17210 Nicotine dependence, cigarettes, uncomplicated: Secondary | ICD-10-CM | POA: Insufficient documentation

## 2019-06-07 DIAGNOSIS — Z800 Family history of malignant neoplasm of digestive organs: Secondary | ICD-10-CM | POA: Insufficient documentation

## 2019-06-07 DIAGNOSIS — Z170 Estrogen receptor positive status [ER+]: Secondary | ICD-10-CM | POA: Insufficient documentation

## 2019-06-07 DIAGNOSIS — Z801 Family history of malignant neoplasm of trachea, bronchus and lung: Secondary | ICD-10-CM | POA: Insufficient documentation

## 2019-06-07 NOTE — Progress Notes (Signed)
Radiation Oncology         [phone] ________________________________  Initial outpatient Consultation by telephone.  The patient opted for telemedicine to maximize safety during the pandemic.  MyChart video was not obtainable.   Name: Stacey [name] MRN: [id number]  Date: 06/07/2019  DOB: [birth year]  XB:MWUX, Hunt Oris, MD  Biagio Borg, MD   REFERRING PHYSICIAN: Biagio Borg, MD  DIAGNOSIS:    ICD-10-CM   1. Bone metastases (HCC)  C79.51     CHIEF COMPLAINT: Here to discuss management of metastatic breast cancer  HISTORY OF PRESENT ILLNESS::Stacey [name] is a 79 y.o. female who presented with progressive left upper extremity edema. She has a history of left breast cancer (stage IIB T2, N1b, invasive ductal) diagnosed in 11/1995 and right breast cancer (stage IIB ypT2, ypN1a, invasive lobular) diagnosed in 11/2010.   She initially noticed the left upper extremity swelling in 2017, but she reports it became much worse in summer 2020. She was referred to physical therapy and orthopedics for injections with no relief from either therapy.   She was referred back to Dr. Jana Hakim on 04/27/19, who recommended chest CT. This was performed on 05/09/19 and revealed: 5.1 cm left subpectoral chest wall mass, compatible with recurrence; associated thoracic nodal metastases; two indeterminate small right lower lobe nodules, not overly suspicious for metastatic disease.  She proceeded to biopsy of the left chest wall mass on 05/26/19, with pathology confirming mammary carcinoma, ER 100%, PR 70%, Her2 negative.  She also underwent PET scan on 05/30/2019 showing: metastatic breast cancer as evidenced by hypermetabolic left pectoral/subpectoral mass with additional hypermetabolic left scapular metastasis.  While her entire left arm hurts from the lymphedema, she describes left shoulder pain in the region of the socket.  She cannot raise her arm above her head.  The pain in the shoulder socket has been  present for at least a couple years.  The pain is in the front and the back of his shoulder.  The pain does not radiate.   She follows closely with physical therapy  PREVIOUS RADIATION THERAPY: Yes  10/19/11 - 12/03/11: Right Breast (Dr. [name]) 1997: Left Breast (Dr. [name])  [place]:  has a past medical history of ANEMIA-NOS, ANXIETY, Breast cancer ([place]) (1997 L, 2012 R), COPD, DIVERTICULOSIS, COLON (2008), Dizziness, GERD, radiation therapy (10/19/11 -12/03/11), HYPERLIPIDEMIA, IRRITABLE BOWEL SYNDROME, HX OF, Left-sided carotid artery disease ([place]), OSTEOARTHRITIS, HAND, and PSVT (paroxysmal supraventricular tachycardia) ([place]).    PAST SURGICAL HISTORY: Past Surgical History:  Procedure Laterality Date  . ABDOMINAL HYSTERECTOMY    . APPENDECTOMY    . BREAST BIOPSY  11/14/10    r breast: inv, insitu mammary carcinoma w/calcif, er/pr +, her2 -  . BREAST SURGERY     lumpectomy  . CATARACT EXTRACTION     both eyes  . ELECTROPHYSIOLOGIC STUDY N/A 05/10/2015   Procedure: SVT Ablation;  Surgeon: Will Meredith Leeds, MD;  Location: [facility];  Service: Cardiovascular;  Laterality: N/A;  . HERNIA REPAIR    . inguinal herniorrhapy  1984   left  . IR US GUIDE BX ASP/DRAIN  05/26/2019  . rectal fissure repair    . s/p benign breast biopsy  2003   right  . s/p left foot surgury  2009  . 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 spiral fx left foot  2008   no surgury  . TMJ ARTHROPLASTY  1989  . TONGUE SURGERY  1988- to remove scar tissue growth   . TONSILLECTOMY      FAMILY HISTORY: family history includes Alcohol abuse in her mother; Brain cancer in her cousin; Breast cancer in her cousin, cousin, and cousin; Colon polyps in her mother; Diabetes in her mother; Hypertension in her mother; Lung cancer in her maternal grandfather and paternal uncle; Lung cancer (age of onset: 48) in her maternal grandmother; Lymphoma in her brother; Pancreatic cancer (age of  onset: 59) in her mother; Stroke in her mother; Testicular cancer in her cousin; Uterine cancer (age of onset: 41) in her mother.  SOCIAL HISTORY:  reports that she quit smoking about 3 years ago. Her smoking use included cigarettes. She has a 54.00 pack-year smoking history. She has never used smokeless tobacco. She reports current alcohol use. She reports that she does not use drugs.  ALLERGIES: Bee venom, Clindamycin/lincomycin, and Codeine  MEDICATIONS:  Current Outpatient Medications  Medication Sig Dispense Refill  . anastrozole (ARIMIDEX) 1 MG tablet Take 1 tablet (1 mg total) by mouth daily. 90 tablet 4  . aspirin 81 MG EC tablet Take 81 mg by mouth daily.      . calcium-vitamin D (OSCAL WITH D) 500-200 MG-UNIT per tablet Take 1 tablet by mouth daily.    . cholecalciferol (VITAMIN D) 1000 UNITS tablet Take 1,000 Units by mouth daily.      . clotrimazole-betamethasone (LOTRISONE) cream Apply 1 application topically 2 (two) times daily as needed. 30 g 1  . diclofenac sodium (VOLTAREN) 1 % GEL Apply 4 g topically 4 (four) times daily as needed. 400 g 11  . diphenhydrAMINE (BENADRYL) 25 MG tablet Take 25 mg by mouth every 6 (six) hours as needed (for bee stings).     . fluticasone (FLONASE) 50 MCG/ACT nasal spray Place 2 sprays into both nostrils daily.    Marland Kitchen loratadine (CLARITIN) 10 MG tablet Take 10 mg by mouth daily.    . meloxicam (MOBIC) 15 MG tablet TAKE 1 TABLET(15 MG) BY MOUTH DAILY Annual appt due in June must see provider for future refills 90 tablet 0  . Multiple Vitamin (MULTIVITAMIN) capsule Take 1 capsule by mouth daily.      Marland Kitchen omeprazole (PRILOSEC) 20 MG capsule TAKE 1 CAPSULE(20 MG) BY MOUTH DAILY 90 capsule 3  . palbociclib (IBRANCE) 125 MG tablet Take 1 tablet (125 mg total) by mouth daily. Take for 21 days on, 7 days off, repeat every 28 days. 21 tablet 0  . polyethylene glycol (MIRALAX / GLYCOLAX) packet take 1 packet once daily if needed for constipation 30 packet 11  .  pseudoephedrine (SUDAFED) 30 MG tablet Take 30 mg by mouth every 4 (four) hours as needed for congestion.     Marland Kitchen telmisartan (MICARDIS) 40 MG tablet Take 1 tablet (40 mg total) by mouth daily. 90 tablet 3  . Tiotropium Bromide Monohydrate (SPIRIVA RESPIMAT) 2.5 MCG/ACT AERS Inhale 2 puffs into the lungs daily. 4 g 11  . zolpidem (AMBIEN) 10 MG tablet TAKE 1 TABLET(10 MG) BY MOUTH AT BEDTIME AS NEEDED 90 tablet 1   No current facility-administered medications for this encounter.    REVIEW OF SYSTEMS:  Notable for that above.   PHYSICAL EXAM:  vitals were not taken for this visit.   Alert and oriented in no acute distress.  Pleasant to speak with  LABORATORY DATA:  Lab Results  Component Value Date   WBC 7.0 05/31/2019   HGB 11.4 (L) 05/31/2019   HCT 34.6 (L) 05/31/2019  MCV 85.6 05/31/2019   PLT 282 05/31/2019   CMP     Component Value Date/Time   NA 141 05/31/2019 1223   NA 140 12/09/2015 1409   K 3.6 05/31/2019 1223   K 4.2 12/09/2015 1409   CL 110 05/31/2019 1223   CL 107 02/02/2012 0939   CO2 23 05/31/2019 1223   CO2 25 12/09/2015 1409   GLUCOSE 117 (H) 05/31/2019 1223   GLUCOSE 102 12/09/2015 1409   GLUCOSE 104 (H) 02/02/2012 0939   BUN 15 05/31/2019 1223   BUN 16.7 12/09/2015 1409   CREATININE 1.04 (H) 05/31/2019 1223   CREATININE 1.02 (H) 05/09/2019 1303   CREATININE 0.8 12/09/2015 1409   CALCIUM 8.6 (L) 05/31/2019 1223   CALCIUM 9.4 12/09/2015 1409   PROT 6.8 05/31/2019 1223   PROT 7.0 12/09/2015 1409   ALBUMIN 3.4 (L) 05/31/2019 1223   ALBUMIN 3.3 (L) 12/09/2015 1409   AST 24 05/31/2019 1223   AST 23 05/09/2019 1303   AST 20 12/09/2015 1409   ALT 15 05/31/2019 1223   ALT 13 05/09/2019 1303   ALT 13 12/09/2015 1409   ALKPHOS 61 05/31/2019 1223   ALKPHOS 54 12/09/2015 1409   BILITOT 0.4 05/31/2019 1223   BILITOT 0.4 05/09/2019 1303   BILITOT <0.22 12/09/2015 1409   GFRNONAA 51 (L) 05/31/2019 1223   GFRNONAA 52 (L) 05/09/2019 1303   GFRAA 59 (L)  05/31/2019 1223   GFRAA >60 05/09/2019 1303         RADIOGRAPHY: CT Chest W Contrast  Result Date: 05/09/2019 CLINICAL DATA:  Left breast cancer, diagnosed 1997 status post lumpectomy, with left arm lymphedema in 2017. Right breast cancer, diagnosed 2013, status post chemotherapy and XRT. Evaluate for recurrence. EXAM: CT CHEST WITH CONTRAST TECHNIQUE: Multidetector CT imaging of the chest was performed during intravenous contrast administration. CONTRAST:  42m OMNIPAQUE IOHEXOL 300 MG/ML  SOLN COMPARISON:  CT chest dated 03/06/2011 FINDINGS: Cardiovascular: Heart is normal in size. No pericardial effusion. No evidence of thoracic aortic aneurysm. Atherosclerotic calcifications of the aortic arch. Mild three-vessel coronary atherosclerosis. Mediastinum/Nodes: Mild thoracic lymphadenopathy, including a 13 mm short axis AP window node (series 2/image 46), a 12 mm short axis right hilar node (series 2/image 59), and a 13 mm short axis subcarinal node (series 2/image 60), suspicious. Status post left axillary lymph node dissection. 3.1 x 5.1 cm left subpectoral chest wall mass (series 2/image 26). Extension anteriorly/superiorly (series 2/image 19). Associated 7 mm short axis left supraclavicular node (series 2/image 12). Lungs/Pleura: Biapical pleural-parenchymal scarring. Radiation changes at the right lung apex and in the anterior right upper lobe. Mild subpleural reticulation/fibrosis in the lungs bilaterally, lower lobe predominant. Mild centrilobular and paraseptal emphysematous changes, upper lung predominant. 5 mm triangular subpleural nodule in the anterior right middle lobe along the minor fissure (series 7/image 32), unchanged since 2013, benign. Additional 4 mm triangular subpleural nodule in the anterior right middle lobe (series 7/image 89), unchanged, benign. 3 mm right lower lobe nodule (series 7/image 108), possibly new. 4 x 6 mm nodule in the lateral right lower lobe (series 7/image 33) and 3  mm nodule in the lateral right lower lobe (series 7/image 87), new. Irregular/linear nodular opacity in the right upper lobe (series 7/image 45), new, although favoring mucous plugging. No focal consolidation. No pleural effusion or pneumothorax. Upper Abdomen: Visualized upper abdomen is notable for scattered hepatic cysts, a moderate hiatal hernia, and vascular calcifications. Musculoskeletal: Postsurgical changes related to prior lumpectomy in the medial right  breast (series 2/image 78). Skin thickening overlying the left breast (series 2/image 100). Mild degenerative changes of the mid thoracic spine. IMPRESSION: 3.1 x 5.1 cm left subpectoral chest wall mass, compatible with recurrence. Associated thoracic nodal metastases, as described above. Two small right lower lobe nodules are indeterminate although not overly suspicious for metastatic disease. Additional coronary nodules are likely benign. Additional postsurgical and post radiation changes, as above. Aortic Atherosclerosis (ICD10-I70.0) and Emphysema (ICD10-J43.9). Electronically Signed   By: Julian Hy M.D.   On: 05/09/2019 15:10   NM PET Image Initial (PI) Skull Base To Thigh  Result Date: 05/31/2019 CLINICAL DATA:  Initial treatment strategy for recurrent breast cancer. EXAM: NUCLEAR MEDICINE PET SKULL BASE TO THIGH TECHNIQUE: 8.2 mCi F-18 FDG was injected intravenously. Full-ring PET imaging was performed from the skull base to thigh after the radiotracer. CT data was obtained and used for attenuation correction and anatomic localization. Fasting blood glucose: 95 mg/dl COMPARISON:  CT chest 05/09/2019 and CT abdomen pelvis 02/09/2013. FINDINGS: Mediastinal blood pool activity: SUV max 2.7 Liver activity: SUV max NA NECK: No abnormal hypermetabolism. Incidental CT findings: None. CHEST: Hypermetabolic prevascular, paratracheal, subcarinal and hilar lymph nodes. Index low left paratracheal lymph node measures 1.5 cm (4/46) with an SUV max of  12.0. Index subcarinal lymph node measures 1.3 cm with an SUV max of 12.8. There is a hypermetabolic left pectoral/subpectoral mass, measuring 2.4 x 5.4 cm (SUV max 7.1) with additional foci of hypermetabolism in and adjacent to the left pectoralis musculature. No hypermetabolic right axillary lymph nodes. No hypermetabolic pulmonary nodules. Incidental CT findings: Atherosclerotic calcification of the aorta and coronary arteries. Heart is mildly enlarged. No pericardial pleural effusion. Centrilobular emphysema. Extensive respiratory motion artifact. 5 mm nodule along the minor fissure (8/31) is too small for PET resolution but stable from 05/09/2019. There may be changes of subpleural radiation fibrosis in the anterior right lung. A 3.2 cm fluid collection in the medial right breast is unchanged from 05/09/2019 and likely a postoperative seroma. No associated hypermetabolism. ABDOMEN/PELVIS: No abnormal hypermetabolism in the liver, adrenal glands, spleen or pancreas. No hypermetabolic lymph nodes. Incidental CT findings: Low-attenuation lesions in the liver measure up to 2.8 cm in the left hepatic lobe, similar. A stone is seen in the gallbladder. Fluid density right adrenal nodule measures 10 mm. Left adrenal gland, kidneys, spleen, pancreas, stomach and bowel are unremarkable. Atherosclerotic calcification of the aorta without aneurysm. SKELETON: Hypermetabolic mixed lytic and sclerotic lesion in the lateral left scapula, near the glenoid fossa, measures approximately 1.7 x 2.9 cm (4/42, SUV max 3.4), new from 03/06/2011. No additional areas of abnormal hypermetabolism. Incidental CT findings: Degenerative changes in the lumbar spine. IMPRESSION: 1. Metastatic breast cancer as evidenced by hypermetabolic left pectoral/subpectoral mass with additional hypermetabolic lesions along the anterior left chest wall, hypermetabolic mediastinal/hilar adenopathy and hypermetabolic left scapular metastasis. 2.  Cholelithiasis. 3. Right adrenal adenoma. 4. Aortic atherosclerosis (ICD10-I70.0). Coronary artery calcification. 5.  Emphysema (ICD10-J43.9). Electronically Signed   By: Lorin Picket M.D.   On: 05/31/2019 09:29   IR US Guide Bx Asp/Drain  Result Date: 05/26/2019 INDICATION: 79 year old female with a history of bilateral breast cancer. She has recently developed left upper extremity lymphedema and CT imaging demonstrates an infiltrative mass surrounding the left neurovascular bundle in extending into the subpectoral space. She presents for ultrasound-guided core biopsy of the same. EXAM: Ultrasound-guided core biopsy MEDICATIONS: None. ANESTHESIA/SEDATION: Moderate (conscious) sedation was employed during this procedure. A total of Versed 1.5 mg and  Fentanyl 75 mcg was administered intravenously. Moderate Sedation Time: 19 minutes. The patient's level of consciousness and vital signs were monitored continuously by radiology nursing throughout the procedure under my direct supervision. FLUOROSCOPY TIME:  None COMPLICATIONS: None immediate. PROCEDURE: Informed written consent was obtained from the patient after a thorough discussion of the procedural risks, benefits and alternatives. All questions were addressed. Maximal Sterile Barrier Technique was utilized including caps, mask, sterile gowns, sterile gloves, sterile drape, hand hygiene and skin antiseptic. A timeout was performed prior to the initiation of the procedure. Ultrasound was used to interrogate the left upper chest and infraclavicular fossa. There is an ill-defined hypoechoic solid mass in the subpectoral space surrounding multiple small vascular structures. Additionally, there are multiple circumscribed hypoechoic solid mass is noted intramuscularly within the pectoralis muscle itself. A suitable skin entry site was selected and marked. The overlying skin was sterilely prepped and draped in the standard fashion using chlorhexidine skin prep.  Local anesthesia was attained by infiltration with 1% lidocaine. A small dermatotomy was made. Under real-time ultrasound guidance, 18 gauge core biopsies were obtained both of the subpectoral mass lesion and the intra pectoral nodule. Biopsy specimens were placed in formalin and delivered to pathology for further analysis. Post biopsy ultrasound imaging demonstrates no evidence of immediate complication. The patient tolerated the procedure well. IMPRESSION: Successful ultrasound-guided core biopsy of subpectoral and intra pectoral soft tissue lesions. Signed, Criselda Peaches, MD, [facility] Radiology Electronically Signed   By: Jacqulynn Cadet M.D.   On: 05/26/2019 16:17      IMPRESSION/PLAN: Metastatic Breast Cancer   This is a lovely 79 year old woman with recently diagnosed metastatic breast cancer.  She is starting systemic therapy under the care of Dr. Jana Hakim.  Her metastatic disease, per PET staging, appears to be in the nodal tissue of the chest, left sub-pectoral/axillary region, and left scapula.  We had a lengthy discussion about her work-up and management of her disease thus far.  We discussed radiotherapy as an option to attempt to palliate her left shoulder pain.  She understands that some of her pain may be related to the scapular lesion.  Of course, an element of her pain is also likely related to her lymphedema.  I do not think that radiotherapy will improve her lymphedema.  However I do think that it is reasonable to focus on scapular metastasis.  Additionally, we could treat the adjacent subpectoral/axillary disease.  The patient is open to both of these options.  I will speak with her team to see if her physical therapist or her medical oncologist have any objection to reirradiating her axilla; I think it is unlikely to worsen her lymphedema although I think it is also unlikely to improve her lymphedema.  The benefit of  treating the axillary disease is that it may stunt the growth or shrink it.  Fortunately her previous radiation was over 2 decades ago.  The risks of reirradiation are small.  I would give her 2.5 Pearline Cables per day over 2 and half weeks which should be tolerated very well.  We discussed the risks benefits and side effects of radiotherapy.  She understands that radiotherapy can cause skin irritation and fatigue.  It would be much less common for her to have serious injury to internal tissues such as the lung, nerves, vessels, or bone.  No guarantees of treatment were given. She is enthusiastic to proceed.  We will schedule her for CT simulation in the near  future.  On date of service, in total I spent 45 minutes on this encounter This encounter was provided by telemedicine platform by telephone.  The patient opted for telemedicine to maximize safety during the pandemic.  MyChart video was not obtainable. The patient has given verbal consent for this type of encounter and has been advised to only accept a meeting of this type in a secure network environment. The attendants for this meeting include Eppie Gibson  and [name].  During the encounter, Eppie Gibson was located at [facility].  [name] was located at home.    __________________________________________   Eppie Gibson, MD  This document serves as a record of services personally performed by Eppie Gibson, MD. It was created on her behalf by Wilburn Mylar, a trained medical scribe. The creation of this record is based on the scribe's personal observations and the provider's statements to them. This document has been checked and approved by the attending provider.

## 2019-06-07 NOTE — Telephone Encounter (Signed)
Oral Chemotherapy Pharmacist Encounter  Patient plans on picking up her [facility] using a one month free voucher at [facility] tomorrow 06/08/19.  Patient Education I spoke with patient for overview of new oral chemotherapy medication: Ibrance (palbociclib) for the treatment of metastatic breast cancer in conjunction with anastrazole, planned duration until disease progression or unacceptable drug toxicity. Planned start 06/08/19.   Pt is doing well. Counseled patient on administration, dosing, side effects, monitoring, drug-food interactions, safe handling, storage, and disposal. Patient will take 1 tablet (125 mg total) by mouth daily. Take for 21 days on, 7 days off, repeat every 28 days.  Side effects include but not limited to: decreased wbc, N/V, fatigue, hair loss, mouth irritation.    Reviewed with patient importance of keeping a medication schedule and plan for any missed doses.  Ms. Mofford voiced understanding and appreciation. All questions answered. Medication handout and calendar will be provided to her during her clinic visit on 06/08/19.   Provided patient with [facility] phone number. Patient knows to call the office with questions or concerns. Oral Chemotherapy [facility] will continue to follow.  Darl Pikes, PharmD, BCPS, BCOP, CPP Hematology/Oncology Clinical Pharmacist [facility]/AP [facility] [phone] 06/07/2019 11:18 AM

## 2019-06-08 ENCOUNTER — Inpatient Hospital Stay: Payer: Medicare Other

## 2019-06-08 ENCOUNTER — Inpatient Hospital Stay: Payer: Medicare Other | Attending: Adult Health | Admitting: Licensed Clinical Social Worker

## 2019-06-08 ENCOUNTER — Other Ambulatory Visit: Payer: Self-pay | Admitting: Licensed Clinical Social Worker

## 2019-06-08 ENCOUNTER — Encounter: Payer: Self-pay | Admitting: Licensed Clinical Social Worker

## 2019-06-08 ENCOUNTER — Other Ambulatory Visit: Payer: Self-pay

## 2019-06-08 ENCOUNTER — Telehealth: Payer: Self-pay

## 2019-06-08 ENCOUNTER — Inpatient Hospital Stay (HOSPITAL_BASED_OUTPATIENT_CLINIC_OR_DEPARTMENT_OTHER): Payer: Medicare Other | Admitting: Oncology

## 2019-06-08 VITALS — BP 144/62 | HR 102 | Temp 99.1°F | Resp 18 | Ht 63.75 in | Wt 158.0 lb

## 2019-06-08 DIAGNOSIS — F419 Anxiety disorder, unspecified: Secondary | ICD-10-CM | POA: Insufficient documentation

## 2019-06-08 DIAGNOSIS — Z86000 Personal history of in-situ neoplasm of breast: Secondary | ICD-10-CM | POA: Diagnosis not present

## 2019-06-08 DIAGNOSIS — C50912 Malignant neoplasm of unspecified site of left female breast: Secondary | ICD-10-CM

## 2019-06-08 DIAGNOSIS — Z853 Personal history of malignant neoplasm of breast: Secondary | ICD-10-CM | POA: Insufficient documentation

## 2019-06-08 DIAGNOSIS — C7951 Secondary malignant neoplasm of bone: Secondary | ICD-10-CM

## 2019-06-08 DIAGNOSIS — Z803 Family history of malignant neoplasm of breast: Secondary | ICD-10-CM | POA: Insufficient documentation

## 2019-06-08 DIAGNOSIS — C792 Secondary malignant neoplasm of skin: Secondary | ICD-10-CM | POA: Insufficient documentation

## 2019-06-08 DIAGNOSIS — Z170 Estrogen receptor positive status [ER+]: Secondary | ICD-10-CM

## 2019-06-08 DIAGNOSIS — Z800 Family history of malignant neoplasm of digestive organs: Secondary | ICD-10-CM | POA: Diagnosis not present

## 2019-06-08 DIAGNOSIS — C50212 Malignant neoplasm of upper-inner quadrant of left female breast: Secondary | ICD-10-CM

## 2019-06-08 DIAGNOSIS — C50211 Malignant neoplasm of upper-inner quadrant of right female breast: Secondary | ICD-10-CM | POA: Diagnosis not present

## 2019-06-08 DIAGNOSIS — I471 Supraventricular tachycardia: Secondary | ICD-10-CM | POA: Diagnosis not present

## 2019-06-08 DIAGNOSIS — Z923 Personal history of irradiation: Secondary | ICD-10-CM | POA: Insufficient documentation

## 2019-06-08 DIAGNOSIS — Z806 Family history of leukemia: Secondary | ICD-10-CM | POA: Diagnosis not present

## 2019-06-08 DIAGNOSIS — M1990 Unspecified osteoarthritis, unspecified site: Secondary | ICD-10-CM | POA: Diagnosis not present

## 2019-06-08 DIAGNOSIS — D3501 Benign neoplasm of right adrenal gland: Secondary | ICD-10-CM | POA: Diagnosis not present

## 2019-06-08 DIAGNOSIS — Z87891 Personal history of nicotine dependence: Secondary | ICD-10-CM | POA: Insufficient documentation

## 2019-06-08 DIAGNOSIS — I700 Atherosclerosis of aorta: Secondary | ICD-10-CM | POA: Insufficient documentation

## 2019-06-08 DIAGNOSIS — M8580 Other specified disorders of bone density and structure, unspecified site: Secondary | ICD-10-CM | POA: Diagnosis not present

## 2019-06-08 DIAGNOSIS — E785 Hyperlipidemia, unspecified: Secondary | ICD-10-CM | POA: Diagnosis not present

## 2019-06-08 DIAGNOSIS — C50911 Malignant neoplasm of unspecified site of right female breast: Secondary | ICD-10-CM

## 2019-06-08 DIAGNOSIS — Z791 Long term (current) use of non-steroidal anti-inflammatories (NSAID): Secondary | ICD-10-CM | POA: Insufficient documentation

## 2019-06-08 DIAGNOSIS — K8020 Calculus of gallbladder without cholecystitis without obstruction: Secondary | ICD-10-CM | POA: Insufficient documentation

## 2019-06-08 DIAGNOSIS — Z79899 Other long term (current) drug therapy: Secondary | ICD-10-CM | POA: Diagnosis not present

## 2019-06-08 DIAGNOSIS — I890 Lymphedema, not elsewhere classified: Secondary | ICD-10-CM | POA: Diagnosis not present

## 2019-06-08 DIAGNOSIS — R9389 Abnormal findings on diagnostic imaging of other specified body structures: Secondary | ICD-10-CM

## 2019-06-08 DIAGNOSIS — Z801 Family history of malignant neoplasm of trachea, bronchus and lung: Secondary | ICD-10-CM | POA: Insufficient documentation

## 2019-06-08 DIAGNOSIS — Z807 Family history of other malignant neoplasms of lymphoid, hematopoietic and related tissues: Secondary | ICD-10-CM

## 2019-06-08 DIAGNOSIS — Z8049 Family history of malignant neoplasm of other genital organs: Secondary | ICD-10-CM

## 2019-06-08 DIAGNOSIS — C7989 Secondary malignant neoplasm of other specified sites: Secondary | ICD-10-CM | POA: Diagnosis present

## 2019-06-08 LAB — CBC WITH DIFFERENTIAL/PLATELET
Abs Immature Granulocytes: 0.02 10*3/uL (ref 0.00–0.07)
Basophils Absolute: 0 10*3/uL (ref 0.0–0.1)
Basophils Relative: 0 %
Eosinophils Absolute: 0.2 10*3/uL (ref 0.0–0.5)
Eosinophils Relative: 3 %
HCT: 36 % (ref 36.0–46.0)
Hemoglobin: 11.7 g/dL — ABNORMAL LOW (ref 12.0–15.0)
Immature Granulocytes: 0 %
Lymphocytes Relative: 17 %
Lymphs Abs: 1.4 10*3/uL (ref 0.7–4.0)
MCH: 28.5 pg (ref 26.0–34.0)
MCHC: 32.5 g/dL (ref 30.0–36.0)
MCV: 87.6 fL (ref 80.0–100.0)
Monocytes Absolute: 0.7 10*3/uL (ref 0.1–1.0)
Monocytes Relative: 9 %
Neutro Abs: 5.8 10*3/uL (ref 1.7–7.7)
Neutrophils Relative %: 71 %
Platelets: 313 10*3/uL (ref 150–400)
RBC: 4.11 MIL/uL (ref 3.87–5.11)
RDW: 14.4 % (ref 11.5–15.5)
WBC: 8.1 10*3/uL (ref 4.0–10.5)
nRBC: 0 % (ref 0.0–0.2)

## 2019-06-08 LAB — COMPREHENSIVE METABOLIC PANEL
ALT: 14 U/L (ref 0–44)
AST: 24 U/L (ref 15–41)
Albumin: 3.5 g/dL (ref 3.5–5.0)
Alkaline Phosphatase: 63 U/L (ref 38–126)
Anion gap: 13 (ref 5–15)
BUN: 17 mg/dL (ref 8–23)
CO2: 23 mmol/L (ref 22–32)
Calcium: 8.7 mg/dL — ABNORMAL LOW (ref 8.9–10.3)
Chloride: 110 mmol/L (ref 98–111)
Creatinine, Ser: 1.02 mg/dL — ABNORMAL HIGH (ref 0.44–1.00)
GFR calc Af Amer: >60 mL/min (ref >60)
GFR calc non Af Amer: 52 mL/min — ABNORMAL LOW (ref >60)
Glucose, Bld: 106 mg/dL — ABNORMAL HIGH (ref 70–99)
Potassium: 3.8 mmol/L (ref 3.5–5.1)
Sodium: 146 mmol/L — ABNORMAL HIGH (ref 135–145)
Total Bilirubin: 0.4 mg/dL (ref 0.3–1.2)
Total Protein: 7 g/dL (ref 6.5–8.1)

## 2019-06-08 LAB — GENETIC SCREENING ORDER

## 2019-06-08 MED ORDER — DENOSUMAB 120 MG/1.7ML ~~LOC~~ SOLN
SUBCUTANEOUS | Status: AC
  Filled 2019-06-08: qty 1.7

## 2019-06-08 MED ORDER — DENOSUMAB 120 MG/1.7ML ~~LOC~~ SOLN
120.0000 mg | Freq: Once | SUBCUTANEOUS | Status: AC
  Administered 2019-06-08: 120 mg via SUBCUTANEOUS

## 2019-06-08 MED FILL — IBRANCE 125 MG TABS: 125 | 21 days supply | Qty: 21 | Fill #0

## 2019-06-08 NOTE — Telephone Encounter (Signed)
Oral Oncology Patient Advocate Encounter  Met patient in [place] to complete application for [employer] in an effort to reduce patient's out of pocket expense for Ibrance to $0.    Application completed and faxed to [phone].   [employer] patient assistance phone number for follow up is [phone].   This encounter will be updated until final determination.   [place] Patient [place] Phone [phone] Fax [phone] 06/08/2019 3:45 PM

## 2019-06-08 NOTE — Patient Instructions (Signed)
Denosumab injection What is this medicine? DENOSUMAB (den oh sue mab) slows bone breakdown. Prolia is used to treat osteoporosis in women after menopause and in men, and in people who are taking corticosteroids for 6 months or more. Xgeva is used to treat a high calcium level due to cancer and to prevent bone fractures and other bone problems caused by multiple myeloma or cancer bone metastases. Xgeva is also used to treat giant cell tumor of the bone. This medicine may be used for other purposes; ask your health care provider or pharmacist if you have questions. COMMON BRAND NAME(S): Prolia, XGEVA What should I tell my health care provider before I take this medicine? They need to know if you have any of these conditions:  dental disease  having surgery or tooth extraction  infection  kidney disease  low levels of calcium or Vitamin D in the blood  malnutrition  on hemodialysis  skin conditions or sensitivity  thyroid or parathyroid disease  an unusual reaction to denosumab, other medicines, foods, dyes, or preservatives  pregnant or trying to get pregnant  breast-feeding How should I use this medicine? This medicine is for injection under the skin. It is given by a health care professional in a hospital or clinic setting. A special MedGuide will be given to you before each treatment. Be sure to read this information carefully each time. For Prolia, talk to your pediatrician regarding the use of this medicine in children. Special care may be needed. For Xgeva, talk to your pediatrician regarding the use of this medicine in children. While this drug may be prescribed for children as young as 13 years for selected conditions, precautions do apply. Overdosage: If you think you have taken too much of this medicine contact a poison control center or emergency room at once. NOTE: This medicine is only for you. Do not share this medicine with others. What if I miss a dose? It is  important not to miss your dose. Call your doctor or health care professional if you are unable to keep an appointment. What may interact with this medicine? Do not take this medicine with any of the following medications:  other medicines containing denosumab This medicine may also interact with the following medications:  medicines that lower your chance of fighting infection  steroid medicines like prednisone or cortisone This list may not describe all possible interactions. Give your health care provider a list of all the medicines, herbs, non-prescription drugs, or dietary supplements you use. Also tell them if you smoke, drink alcohol, or use illegal drugs. Some items may interact with your medicine. What should I watch for while using this medicine? Visit your doctor or health care professional for regular checks on your progress. Your doctor or health care professional may order blood tests and other tests to see how you are doing. Call your doctor or health care professional for advice if you get a fever, chills or sore throat, or other symptoms of a cold or flu. Do not treat yourself. This drug may decrease your body's ability to fight infection. Try to avoid being around people who are sick. You should make sure you get enough calcium and vitamin D while you are taking this medicine, unless your doctor tells you not to. Discuss the foods you eat and the vitamins you take with your health care professional. See your dentist regularly. Brush and floss your teeth as directed. Before you have any dental work done, tell your dentist you are   receiving this medicine. Do not become pregnant while taking this medicine or for 5 months after stopping it. Talk with your doctor or health care professional about your birth control options while taking this medicine. Women should inform their doctor if they wish to become pregnant or think they might be pregnant. There is a potential for serious side  effects to an unborn child. Talk to your health care professional or pharmacist for more information. What side effects may I notice from receiving this medicine? Side effects that you should report to your doctor or health care professional as soon as possible:  allergic reactions like skin rash, itching or hives, swelling of the face, lips, or tongue  bone pain  breathing problems  dizziness  jaw pain, especially after dental work  redness, blistering, peeling of the skin  signs and symptoms of infection like fever or chills; cough; sore throat; pain or trouble passing urine  signs of low calcium like fast heartbeat, muscle cramps or muscle pain; pain, tingling, numbness in the hands or feet; seizures  unusual bleeding or bruising  unusually weak or tired Side effects that usually do not require medical attention (report to your doctor or health care professional if they continue or are bothersome):  constipation  diarrhea  headache  joint pain  loss of appetite  muscle pain  runny nose  tiredness  upset stomach This list may not describe all possible side effects. Call your doctor for medical advice about side effects. You may report side effects to FDA at 1-800-FDA-1088. Where should I keep my medicine? This medicine is only given in a clinic, doctor's office, or other health care setting and will not be stored at home. NOTE: This sheet is a summary. It may not cover all possible information. If you have questions about this medicine, talk to your doctor, pharmacist, or health care provider.  2020 [employer] (2017-05-28 16:10:44)

## 2019-06-08 NOTE — Progress Notes (Signed)
ID: [name]   DOB: 06-02-1940  MR#: [id number]  OVZ#:[id number]  Patient Care Team: Biagio Borg, MD as PCP - General (Internal Medicine) Tyler Pita, MD as Consulting Physician (Radiation Oncology) Roseanne Kaufman, MD as Consulting Physician (Orthopedic Surgery) Lyndal Pulley, DO as Consulting Physician (Family Medicine) Stacey [name], Virgie Dad, MD as Consulting Physician (Oncology) OTHER MD:   INTERVAL HISTORY:   Prabhnoor returns today for follow up of her progressive left upper extremity lymphedema.   She started her anastrozole 06/02/2019.  She is tolerating it well so far.  She has had absolutely no side effects.  She is supposed to start palbociclib today.  She brought all the appropriate papers so we can file so she can receive the medication essentially free of charge.   REVIEW OF SYSTEMS: Stacey [name] still has very limited use of her left arm, which is also swollen.  She does not have any unusual shortness of breath, cough phlegm production or pleurisy or change in bowel or bladder habits.  A detailed review of systems was otherwise stable.   HISTORY OF PRESENT ILLNESS:  From the original intake note:  " Stacey [name] is a 79 year old [place] woman, who has a history of breast cancer starting in 1997 and in 2013.    On 11/22/95, she underwent a left breast biopsy, which revealed malignant cells.  She had a left breast lumpectomy with re-excision on 11/29/95, with final pathology revealed IDC, grade 2, 2.5 cm, ER 98%, PR 97%, Ki67 8% with one of 15 nodes being positive.  She received chemotherapy, 4 cycles per patient, along with radiation and took Tamoxifen for 7 years following radiation completion.  Records from 1997 limited.    In October 2012, she had a biopsy of the right breast after mammography recommended additional images for a dense area in the right breast.  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  On 11/25/10, she had an MRI that measured the area in the right breast as 2.2 cm.  She started neoadjuvant [name] in November 2012 and has continued it since.  The area of concern measured 1.7 cm on a repeat MRI in March of 2013.  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  Surgical margins were negative with one of one sentinel lymph node positive for metastatic carcinoma.  She then underwent radiation therapy under the care of Dr. [name] from 10/19/11 to 12/03/11. "   Her subsequent history is as detailed below   PAST MEDICAL HISTORY: Past Medical History:  Diagnosis Date  . ANEMIA-NOS   . ANXIETY   . Breast cancer ([facility]) 1997 L, 2012 R   s/p chemo/xrt  . COPD    resolved  . DIVERTICULOSIS, COLON 2008  . Dizziness   . Family history of breast cancer   . Family history of lung cancer   . Family history of lymphoma   . Family history of pancreatic cancer   . Family history of uterine cancer   . GERD   . Hx of radiation therapy 10/19/11 -12/03/11   right breast  . HYPERLIPIDEMIA   . IRRITABLE BOWEL SYNDROME, HX OF   . Left-sided carotid artery disease ([facility])    moderate left ICA stenosis  .  OSTEOARTHRITIS, HAND   . PSVT (paroxysmal supraventricular tachycardia) ([facility])    symptomatic on event monitor    PAST SURGICAL HISTORY: Past Surgical History:  Procedure Laterality Date  . ABDOMINAL HYSTERECTOMY    . APPENDECTOMY    . BREAST BIOPSY  11/14/10    r breast: inv, insitu mammary carcinoma w/calcif, er/pr +, her2 -  . BREAST SURGERY     lumpectomy  . CATARACT EXTRACTION     both eyes  . ELECTROPHYSIOLOGIC STUDY N/A 05/10/2015   Procedure: SVT Ablation;  Surgeon: Will Meredith Leeds, MD;  Location: [facility];  Service: Cardiovascular;  Laterality: N/A;  .  HERNIA REPAIR    . inguinal herniorrhapy  1984   left  . IR US GUIDE BX ASP/DRAIN  05/26/2019  . rectal fissure repair    . s/p benign breast biopsy  2003   right  . s/p left foot surgury  2009  . 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 spiral fx left foot  2008   no surgury  . TMJ ARTHROPLASTY  1989  . [place]- to remove scar tissue growth   . TONSILLECTOMY      FAMILY HISTORY Family History  Problem Relation Age of Onset  . Hypertension Mother   . Stroke Mother   . Colon polyps Mother   . Diabetes Mother   . Pancreatic cancer Mother 73  . Uterine cancer Mother 44  . Lymphoma Brother        burkitts  . Lung cancer Paternal Uncle   . Lung cancer Maternal Grandmother 24       non-smoker  . Lung cancer Maternal Grandfather   . Breast cancer Cousin        maternal cousin, dx in her mid 46s  . Brain cancer Cousin        maternal cousin's son; dx in his 56s  . Testicular cancer Cousin        maternal cousin's son;   . Breast cancer Cousin        paternal cousin; dx in her 48s  . Breast cancer Cousin        paternal cousin's daughter; dx in 21s; neg genetic testing  . Colon cancer Neg Hx   . Esophageal cancer Neg Hx   . Stomach cancer Neg Hx   . Rectal cancer Neg Hx     GYNECOLOGIC HISTORY:   Menarche age 16, GX P0 (one miscarriage at 5 months). Status post vaginal hysterectomy in the late 1970s, no salpingo-oophorectomy    SOCIAL HISTORY:   The patient lives alone and is divorced. She retired from [employer], were she worked in Physiological scientist.Stacey [name]     ADVANCED DIRECTIVES:  Living will in place; the patient's healthcare power of attorney is her stepdaughter, Vladimir Faster, who can be reached at [place]: Social History   Tobacco Use  . Smoking status: Former Smoker    Packs/day: 1.00    Years: 54.00    Pack years: 54.00    Types: Cigarettes    Quit date: 01/03/2016    Years since quitting: 3.4  . Smokeless tobacco:  Never Used  Substance Use Topics  . Alcohol use: Yes    Comment: rare/ drinks socially  . Drug use: No    Allergies  Allergen Reactions  . Bee Venom Swelling  . Clindamycin/Lincomycin Diarrhea and Nausea And Vomiting  . Codeine Nausea And Vomiting  Current Outpatient Medications  Medication Sig Dispense Refill  . anastrozole (ARIMIDEX) 1 MG tablet Take 1 tablet (1 mg total) by mouth daily. 90 tablet 4  . aspirin 81 MG EC tablet Take 81 mg by mouth daily.      . calcium-vitamin D (OSCAL WITH D) 500-200 MG-UNIT per tablet Take 1 tablet by mouth daily.    . cholecalciferol (VITAMIN D) 1000 UNITS tablet Take 1,000 Units by mouth daily.      . clotrimazole-betamethasone (LOTRISONE) cream Apply 1 application topically 2 (two) times daily as needed. 30 g 1  . diclofenac sodium (VOLTAREN) 1 % GEL Apply 4 g topically 4 (four) times daily as needed. 400 g 11  . diphenhydrAMINE (BENADRYL) 25 MG tablet Take 25 mg by mouth every 6 (six) hours as needed (for bee stings).     . fluticasone (FLONASE) 50 MCG/ACT nasal spray Place 2 sprays into both nostrils daily.    Stacey [name] loratadine (CLARITIN) 10 MG tablet Take 10 mg by mouth daily.    . meloxicam (MOBIC) 15 MG tablet TAKE 1 TABLET(15 MG) BY MOUTH DAILY Annual appt due in June must see provider for future refills 90 tablet 0  . Multiple Vitamin (MULTIVITAMIN) capsule Take 1 capsule by mouth daily.      Stacey [name] omeprazole (PRILOSEC) 20 MG capsule TAKE 1 CAPSULE(20 MG) BY MOUTH DAILY 90 capsule 3  . polyethylene glycol (MIRALAX / GLYCOLAX) packet take 1 packet once daily if needed for constipation 30 packet 11  . pseudoephedrine (SUDAFED) 30 MG tablet Take 30 mg by mouth every 4 (four) hours as needed for congestion.     Stacey [name] telmisartan (MICARDIS) 40 MG tablet Take 1 tablet (40 mg total) by mouth daily. 90 tablet 3  . Tiotropium Bromide Monohydrate (SPIRIVA RESPIMAT) 2.5 MCG/ACT AERS Inhale 2 puffs into the lungs daily. 4 g 11  . zolpidem (AMBIEN) 10 MG tablet  TAKE 1 TABLET(10 MG) BY MOUTH AT BEDTIME AS NEEDED 90 tablet 1  . palbociclib (IBRANCE) 125 MG tablet Take 1 tablet (125 mg total) by mouth daily. Take for 21 days on, 7 days off, repeat every 28 days. (Patient not taking: Reported on 06/08/2019) 21 tablet 0   No current facility-administered medications for this visit.    OBJECTIVE: White woman in no acute distress Vitals:   06/08/19 1417  BP: (!) 144/62  Pulse: (!) 102  Resp: 18  Temp: 99.1 F (37.3 C)  SpO2: 97%     Body mass index is 27.33 kg/m.     Sclerae unicteric, EOMs intact Wearing a mask No cervical or supraclavicular adenopathy Lungs no rales or rhonchi Heart regular rate and rhythm Abd soft, nontender, positive bowel sounds MSK kyphosis but no focal spinal tenderness, grade 3 left upper extremity lymphedema, no erythema Neuro: nonfocal, well oriented, appropriate affect Breasts: The left breast is status post lumpectomy and radiation and shows the expected skin changes but there is no obvious recurrence   LAB RESULTS: Lab Results  Component Value Date   WBC 8.1 06/08/2019   NEUTROABS 5.8 06/08/2019   HGB 11.7 (L) 06/08/2019   HCT 36.0 06/08/2019   MCV 87.6 06/08/2019   PLT 313 06/08/2019      Chemistry      Component Value Date/Time   NA 141 05/31/2019 1223   NA 140 12/09/2015 1409   K 3.6 05/31/2019 1223   K 4.2 12/09/2015 1409   CL 110 05/31/2019 1223   CL 107 02/02/2012 0939   CO2  23 05/31/2019 1223   CO2 25 12/09/2015 1409   BUN 15 05/31/2019 1223   BUN 16.7 12/09/2015 1409   CREATININE 1.04 (H) 05/31/2019 1223   CREATININE 1.02 (H) 05/09/2019 1303   CREATININE 0.8 12/09/2015 1409      Component Value Date/Time   CALCIUM 8.6 (L) 05/31/2019 1223   CALCIUM 9.4 12/09/2015 1409   ALKPHOS 61 05/31/2019 1223   ALKPHOS 54 12/09/2015 1409   AST 24 05/31/2019 1223   AST 23 05/09/2019 1303   AST 20 12/09/2015 1409   ALT 15 05/31/2019 1223   ALT 13 05/09/2019 1303   ALT 13 12/09/2015 1409    BILITOT 0.4 05/31/2019 1223   BILITOT 0.4 05/09/2019 1303   BILITOT <0.22 12/09/2015 1409       Lab Results  Component Value Date   LABCA2 56 (H) 12/10/2010    No components found for: [id number]  No results for input(s): INR in the last 168 hours.  Urinalysis    Component Value Date/Time   COLORURINE YELLOW 07/13/2018 0938   APPEARANCEUR [place] (A) 07/13/2018 0938   LABSPEC 1.020 07/13/2018 0938   PHURINE 6.0 07/13/2018 0938   GLUCOSEU NEGATIVE 07/13/2018 0938   HGBUR NEGATIVE 07/13/2018 0938   BILIRUBINUR NEGATIVE 07/13/2018 0938   KETONESUR NEGATIVE 07/13/2018 0938   UROBILINOGEN 0.2 07/13/2018 0938   NITRITE NEGATIVE 07/13/2018 0938   LEUKOCYTESUR TRACE (A) 07/13/2018 0938    STUDIES: NM PET Image Initial (PI) Skull Base To Thigh  Result Date: 05/31/2019 CLINICAL DATA:  Initial treatment strategy for recurrent breast cancer. EXAM: NUCLEAR MEDICINE PET SKULL BASE TO THIGH TECHNIQUE: 8.2 mCi F-18 FDG was injected intravenously. Full-ring PET imaging was performed from the skull base to thigh after the radiotracer. CT data was obtained and used for attenuation correction and anatomic localization. Fasting blood glucose: 95 mg/dl COMPARISON:  CT chest 05/09/2019 and CT abdomen pelvis 02/09/2013. FINDINGS: Mediastinal blood pool activity: SUV max 2.7 Liver activity: SUV max NA NECK: No abnormal hypermetabolism. Incidental CT findings: None. CHEST: Hypermetabolic prevascular, paratracheal, subcarinal and hilar lymph nodes. Index low left paratracheal lymph node measures 1.5 cm (4/46) with an SUV max of 12.0. Index subcarinal lymph node measures 1.3 cm with an SUV max of 12.8. There is a hypermetabolic left pectoral/subpectoral mass, measuring 2.4 x 5.4 cm (SUV max 7.1) with additional foci of hypermetabolism in and adjacent to the left pectoralis musculature. No hypermetabolic right axillary lymph nodes. No hypermetabolic pulmonary nodules. Incidental CT findings: Atherosclerotic  calcification of the aorta and coronary arteries. Heart is mildly enlarged. No pericardial pleural effusion. Centrilobular emphysema. Extensive respiratory motion artifact. 5 mm nodule along the minor fissure (8/31) is too small for PET resolution but stable from 05/09/2019. There may be changes of subpleural radiation fibrosis in the anterior right lung. A 3.2 cm fluid collection in the medial right breast is unchanged from 05/09/2019 and likely a postoperative seroma. No associated hypermetabolism. ABDOMEN/PELVIS: No abnormal hypermetabolism in the liver, adrenal glands, spleen or pancreas. No hypermetabolic lymph nodes. Incidental CT findings: Low-attenuation lesions in the liver measure up to 2.8 cm in the left hepatic lobe, similar. A stone is seen in the gallbladder. Fluid density right adrenal nodule measures 10 mm. Left adrenal gland, kidneys, spleen, pancreas, stomach and bowel are unremarkable. Atherosclerotic calcification of the aorta without aneurysm. SKELETON: Hypermetabolic mixed lytic and sclerotic lesion in the lateral left scapula, near the glenoid fossa, measures approximately 1.7 x 2.9 cm (4/42, SUV max 3.4), new from 03/06/2011.  No additional areas of abnormal hypermetabolism. Incidental CT findings: Degenerative changes in the lumbar spine. IMPRESSION: 1. Metastatic breast cancer as evidenced by hypermetabolic left pectoral/subpectoral mass with additional hypermetabolic lesions along the anterior left chest wall, hypermetabolic mediastinal/hilar adenopathy and hypermetabolic left scapular metastasis. 2. Cholelithiasis. 3. Right adrenal adenoma. 4. Aortic atherosclerosis (ICD10-I70.0). Coronary artery calcification. 5.  Emphysema (ICD10-J43.9). Electronically Signed   By: Lorin Picket M.D.   On: 05/31/2019 09:29   IR US Guide Bx Asp/Drain  Result Date: 05/26/2019 INDICATION: 79 year old female with a history of bilateral breast cancer. She has recently developed left upper extremity  lymphedema and CT imaging demonstrates an infiltrative mass surrounding the left neurovascular bundle in extending into the subpectoral space. She presents for ultrasound-guided core biopsy of the same. EXAM: Ultrasound-guided core biopsy MEDICATIONS: None. ANESTHESIA/SEDATION: Moderate (conscious) sedation was employed during this procedure. A total of Versed 1.5 mg and Fentanyl 75 mcg was administered intravenously. Moderate Sedation Time: 19 minutes. The patient's level of consciousness and vital signs were monitored continuously by radiology nursing throughout the procedure under my direct supervision. FLUOROSCOPY TIME:  None COMPLICATIONS: None immediate. PROCEDURE: Informed written consent was obtained from the patient after a thorough discussion of the procedural risks, benefits and alternatives. All questions were addressed. Maximal Sterile Barrier Technique was utilized including caps, mask, sterile gowns, sterile gloves, sterile drape, hand hygiene and skin antiseptic. A timeout was performed prior to the initiation of the procedure. Ultrasound was used to interrogate the left upper chest and infraclavicular fossa. There is an ill-defined hypoechoic solid mass in the subpectoral space surrounding multiple small vascular structures. Additionally, there are multiple circumscribed hypoechoic solid mass is noted intramuscularly within the pectoralis muscle itself. A suitable skin entry site was selected and marked. The overlying skin was sterilely prepped and draped in the standard fashion using chlorhexidine skin prep. Local anesthesia was attained by infiltration with 1% lidocaine. A small dermatotomy was made. Under real-time ultrasound guidance, 18 gauge core biopsies were obtained both of the subpectoral mass lesion and the intra pectoral nodule. Biopsy specimens were placed in formalin and delivered to pathology for further analysis. Post biopsy ultrasound imaging demonstrates no evidence of immediate  complication. The patient tolerated the procedure well. IMPRESSION: Successful ultrasound-guided core biopsy of subpectoral and intra pectoral soft tissue lesions. Signed, Criselda Peaches, MD, [facility] Electronically Signed   By: Jacqulynn Cadet M.D.   On: 05/26/2019 16:17     ASSESSMENT: 79 y.o. [place] woman:  LEFT BREAST #1  S/P LEFT breast lumpectomy with re-excision on 11/29/95 for a T2 N1bi stage IIb invasive ductal carcinoma , grade 2, estrogen receptor 98% positive, progesterone receptor 97% positive, Ki67 8%.  #2 status post 4 cycles of doxorubicin and cyclophosphamide,    (i) followed by radiation therapy under the care of Dr. [name].  #3 received tamoxifen for a total of seven years   RIGHT BREAST #4  S/P biopsy of the RIGHT breast upper inner quadrant on 11/14/10 showing invasive ductal carcinoma,, grade 2, estrogen receptor 85% and progesterone receptor 57% positive, Ki67 20%, HER2 not amplified.    #5 started neoadjuvant letrozole in November 2012; switched to tamoxifen as of February 2016 due to osteopenia concerns  #6  S/P right lumpectomy with sentinel lymph node biopsy on 09/03/11 for a ypT2, ypN1a, stage IIB invasive lobular carcinoma, grade 2,estrogen receptor 97% positive, progesterone receptor 12% positive, with no HER-2 amplification.  #7 status post right breast radiation  therapy under the care of Dr. [name] from 10/19/2011 to 12/03/2011.   #8 did not meet criteria for genetic testing according to her insurance company.  #9 osteopenia, with a T score of -1.8 on DEXA scan [facility] 03/07/2013  (a) status post multiple dental extractions and implants  (b) repeat bone density at [facility] 04/02/2015 shows a T score of -2.0  METASTATIC DISEASE: April 2021 #10:  left upper extremity lymphedema led to chest CT scan 05/09/2019 showing a 5.1 cm left subpectoral chest wall mass and thoracic  lymphadenopathy   (a) CT biopsy of the chest wall mass 05/25/2019 confirms recurrent breast cancer, strongly estrogen and progesterone receptor positive, HER-2 not amplified  (b) PET scan 05/30/2019 shows a left subpectoral mass measuring 5.4 cm, with significant regional and mediastinal adenopathy, sclerotic left scapular metastasis, but no liver or lung involvement.  (c) CA 27-29 is informative  #11 anastrozole started 06/02/2019; palbociclib to be added 06/08/2019  #12 denosumab/Xgeva starting 06/08/2019, to be repeated every 28 days  #13 genetics testing pending   PLAN:   Kehaulani started anastrozole a week ago and so far she is tolerating it without any side effects.  She will receive her first denosumab/Xgeva dose today.  She is meeting with one of our oral chemotherapy pharmacy specialist to get the paperwork done so that she may obtain the palbociclib essentially free of charge.  Hopefully she can start it today.  She is returning tomorrow for her Sim and will be meeting later on with Dr. Lanell Persons.  At this point I am setting her up for every 28-day Xgeva doses with lab work.  She will see [place] in 21monthand then the third month I will see her.  At that point if all is going well we can move the visits to every 3 months continuing the Xgeva every 28 days and planning to restage sometime in October  She knows to call for any other issue that may develop before the next visit  Total encounter time today 35 minutes.*Virgie DadMagrinat    06/08/2019 [facility] [place] [id number]. [phone] FJoylene [phone]  I, KWilburn Mylar am acting as scribe for Dr. GVirgie Dad Finneas Mathe.  I, GLurline DelMD, have reviewed the above documentation for accuracy and completeness, and I agree with the above.    *Total Encounter Time as defined by the Centers for Medicare and Medicaid Services includes, in addition to the  face-to-face time of a patient visit (documented in the note above) non-face-to-face time: obtaining and reviewing outside history, ordering and reviewing medications, tests or procedures, care coordination (communications with other health care professionals or caregivers) and documentation in the medical record.

## 2019-06-08 NOTE — Progress Notes (Signed)
REFERRING PROVIDER: Chauncey Cruel, MD [address] [place],  [place] [address]  PRIMARY PROVIDER:  Biagio Borg, MD  PRIMARY REASON FOR VISIT:  1. Malignant neoplasm of upper-inner quadrant of left breast in female, estrogen receptor positive ([place])   2. Family history of breast cancer   3. Family history of pancreatic cancer   4. Family history of uterine cancer   5. Family history of lymphoma   6. Family history of lung cancer   7. History of ductal carcinoma in situ (DCIS) of breast   8. Recurrent cancer of left breast ([place])      HISTORY OF PRESENT ILLNESS:   Stacey [name], a 79 y.o. female, was seen for a [facility] cancer genetics consultation at the request of Dr. Jana Hakim due to her personal and family history of cancer.  Stacey [name] presents to clinic today to discuss the possibility of a hereditary predisposition to cancer, genetic testing, and to further clarify her future cancer risks, as well as potential cancer risks for family members.   In 1997, at the age of 64, Stacey [name] was diagnosed with left breast cancer, IDC, ER/PR+. Treated with lumpectomy, chemotherapy, radiation and tamoxifen.  In 2013, at the age of 1, Stacey [name] was diagnosed with right breast cancer, ER/PR+, Her2-. This was treated with neoadjuvant antiestrogen therapy, right lumpectomy and radiation.  In 2021, at the age of 74, Stacey [name] was diagnosed with metastatic breast cancer, ER+, PR+, Her2-.   CANCER HISTORY:  Oncology History  Malignant neoplasm of upper-inner quadrant of left breast in female, estrogen receptor positive ([place])  11/1995 Cancer Diagnosis   History of IDC of left breast, grade 2, ER/PR+, 1/15 LN positive for malignancy, s/p doxorubicin and cyclophosphamide x 4 cycles followed by adjuvant RT to left breast and adjuvant tamoxifen x 7 years [name])   11/14/2010 Initial Biopsy   Right breast needle core bx: IMC/MCIS, grade 2 , ER/PR+, HER2/neu negative   12/2010 -   Neo-Adjuvant Anti-estrogen oral therapy   letrozole 2.5 mg daily; changed to tamoxifen 20 mg daily Feb, 2013 due to osteopenia   09/03/2011 Pathologic Stage   Stage IIB: ypT2 ypN1a   09/03/2011 Definitive Surgery   Right lumpectomy/SLNB: ILC with calcifications, grade 2, 1 sentinel LN positive for malignancy   10/19/2011 - 12/03/2011 Radiation Therapy   Adjuvant RT to right breast [name])     Past Medical History:  Diagnosis Date  . ANEMIA-NOS   . ANXIETY   . Breast cancer ([place]) 1997 L, 2012 R   s/p chemo/xrt  . COPD    resolved  . DIVERTICULOSIS, COLON 2008  . Dizziness   . Family history of breast cancer   . Family history of lung cancer   . Family history of lymphoma   . Family history of pancreatic cancer   . Family history of uterine cancer   . GERD   . Hx of radiation therapy 10/19/11 -12/03/11   right breast  . HYPERLIPIDEMIA   . IRRITABLE BOWEL SYNDROME, HX OF   . Left-sided carotid artery disease (HCC)    moderate left ICA stenosis  . OSTEOARTHRITIS, HAND   . PSVT (paroxysmal supraventricular tachycardia) (HCC)    symptomatic on event monitor    Past Surgical History:  Procedure Laterality Date  . ABDOMINAL HYSTERECTOMY    . APPENDECTOMY    . BREAST BIOPSY  11/14/10    r breast: inv, insitu mammary carcinoma w/calcif, er/pr +, her2 -  . BREAST  SURGERY     lumpectomy  . CATARACT EXTRACTION     both eyes  . ELECTROPHYSIOLOGIC STUDY N/A 05/10/2015   Procedure: SVT Ablation;  Surgeon: Will Meredith Leeds, MD;  Location: [facility];  Service: Cardiovascular;  Laterality: N/A;  . HERNIA REPAIR    . inguinal herniorrhapy  1984   left  . IR US GUIDE BX ASP/DRAIN  05/26/2019  . rectal fissure repair    . s/p benign breast biopsy  2003   right  . s/p left foot surgury  2009  . 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 spiral fx left foot  2008   no surgury  . TMJ ARTHROPLASTY  1989  . [place]- to remove scar tissue growth   .  TONSILLECTOMY      Social History   Socioeconomic History  . Marital status: Divorced    Spouse name: 2 Step-children  . Number of children: 2  . Years of education: Not on file  . Highest education level: Not on file  Occupational History  . Occupation: retired Banker  Tobacco Use  . Smoking status: Former Smoker    Packs/day: 1.00    Years: 54.00    Pack years: 54.00    Types: Cigarettes    Quit date: 01/03/2016    Years since quitting: 3.4  . Smokeless tobacco: Never Used  Substance and Sexual Activity  . Alcohol use: Yes    Comment: rare/ drinks socially  . Drug use: No  . Sexual activity: Never  Other Topics Concern  . Not on file  Social History Narrative   Patient gets no regular exercise   No biological children   2 step children   Social Determinants of Health   Financial Resource Strain:   . Difficulty of Paying Living Expenses:   Food Insecurity:   . Worried About Charity fundraiser in the Last Year:   . Arboriculturist in the Last Year:   Transportation Needs:   . Film/video editor (Medical):   Marland Kitchen Lack of Transportation (Non-Medical):   Physical Activity:   . Days of Exercise per Week:   . Minutes of Exercise per Session:   Stress:   . Feeling of Stress :   Social Connections:   . Frequency of Communication with Friends and Family:   . Frequency of Social Gatherings with Friends and Family:   . Attends Religious Services:   . Active Member of Clubs or Organizations:   . Attends Archivist Meetings:   Marland Kitchen Marital Status:      FAMILY HISTORY:  We obtained a detailed, 4-generation family history.  Significant diagnoses are listed below: Family History  Problem Relation Age of Onset  . Hypertension Mother   . Stroke Mother   . Colon polyps Mother   . Diabetes Mother   . Pancreatic cancer Mother 52  . Uterine cancer Mother 42  . Lymphoma Brother        burkitts  . Lung cancer Paternal Uncle   . Lung cancer Maternal  Grandmother 26       non-smoker  . Lung cancer Maternal Grandfather   . Breast cancer Cousin        maternal cousin, dx in her mid 77s  . Brain cancer Cousin        maternal cousin's son; dx in his 28s  . Testicular cancer Cousin  maternal cousin's son;   . Breast cancer Cousin        paternal cousin; dx in her 3s  . Breast cancer Cousin        paternal cousin's daughter; dx in 86s; neg genetic testing  . Colon cancer Neg Hx   . Esophageal cancer Neg Hx   . Stomach cancer Neg Hx   . Rectal cancer Neg Hx    Stacey [name] does not have children. She had one brother who was diagnosed with lymphoma at 34 and died at 46.   Stacey [name] mother was diagnosed with endometrial cancer at 48 and pancreatic cancer at 14. Patient had 1 maternal uncle, 1 maternal aunt, no cancers. A maternal cousin had breast cancer in her 29s. Both maternal grandparents had lung cancer in their 48s.  Stacey [name] father died at 5. Patient had 3 paternal aunts, 3 paternal uncles. One uncle had lung cancer at 83. A paternal cousin had breast cancer at 41 and a paternal first cousin once removed had breast cancer in her 14s and reportedly negative genetic tesitng. Paternal grandfather died at 59, grandmother died at 42.  Stacey [name] is aware of previous family history of genetic testing for hereditary cancer risks. Patient's maternal ancestors are of [place] descent, and paternal ancestors are of [place], [place], [place] descent. There is no reported Ashkenazi Jewish ancestry. There is no known consanguinity.  GENETIC COUNSELING ASSESSMENT: Stacey [name] is a 79 y.o. female with a personal and family history of cancer which is somewhat suggestive of a hereditary cancer syndrome and predisposition to cancer. We, therefore, discussed and recommended the following at today's visit.   DISCUSSION: We discussed that 5 - 10% of breast cancer is hereditary, with most cases associated with BRCA1/BRCA2 mutations.  There are other genes  that can be associated with hereditary breast cancer syndromes. There are also genes associated with hereditary pancreatic cancer and hereditary endometrial cancer, such as Lynch syndrome.   We discussed that testing is beneficial for several reasons including knowing if a patient is a candidate for targeted therapy, knowing how to follow individuals for cancer screenings and understand if other family members could be at risk for cancer and allow them to undergo genetic testing.   We reviewed the characteristics, features and inheritance patterns of hereditary cancer syndromes. We also discussed genetic testing, including the appropriate family members to test, the process of testing, insurance coverage and turn-around-time for results. We discussed the implications of a negative, positive and/or variant of uncertain significant result. We recommended Stacey [name] pursue genetic testing for the [facility] Common Hereditary Cancers gene panel.   The Common Hereditary Cancers Panel offered by [facility] includes sequencing and/or deletion duplication testing of the following 48 genes: APC, ATM, AXIN2, BARD1, BMPR1A, BRCA1, BRCA2, BRIP1, CDH1, CDKN2A (p14ARF), CDKN2A (p16INK4a), CKD4, CHEK2, CTNNA1, DICER1, EPCAM (Deletion/duplication testing only), GREM1 (promoter region deletion/duplication testing only), KIT, MEN1, MLH1, MSH2, MSH3, MSH6, MUTYH, NBN, NF1, NHTL1, PALB2, PDGFRA, PMS2, POLD1, POLE, PTEN, RAD50, RAD51C, RAD51D, RNF43, SDHB, SDHC, SDHD, SMAD4, SMARCA4. STK11, TP53, TSC1, TSC2, and VHL.  The following genes were evaluated for sequence changes only: SDHA and HOXB13 c.251G>A variant only.  Based on Stacey [name]'s family history of cancer, she meets medical criteria for genetic testing. Despite that she meets criteria, she may still have an out of pocket cost. We discussed that if her out of pocket cost for testing is over $100, the laboratory will call and confirm whether she wants to proceed with  testing.  If  the out of pocket cost of testing is less than $100 she will be billed by the genetic testing laboratory.   PLAN: After considering the risks, benefits, and limitations, Stacey [name] provided informed consent to pursue genetic testing and the blood sample was sent to [facility] for analysis of the Common Hereditary Cancers Panel. Results should be available within approximately 2-3 weeks' time, at which point they will be disclosed by telephone to Stacey [name], as will any additional recommendations warranted by these results. Stacey [name] will receive a summary of her genetic counseling visit and a copy of her results once available. This information will also be available in Epic.   Stacey [name] questions were answered to her satisfaction today. Our contact information was provided should additional questions or concerns arise. Thank you for the referral and allowing [place] to share in the care of your patient.   Faith Rogue, MS, [facility] Genetic Counselor [email or web address] Phone: [phone]  The patient was seen for a total of 40 minutes in face-to-face genetic counseling.  Drs. Magrinat, Lindi Adie and/or Burr Medico were available for discussion regarding this case.   _______________________________________________________________________ For Office Staff:  Number of people involved in session: 1 Was an Intern/ student involved with case: no

## 2019-06-09 ENCOUNTER — Other Ambulatory Visit: Payer: Self-pay

## 2019-06-09 ENCOUNTER — Ambulatory Visit
Admission: RE | Admit: 2019-06-09 | Discharge: 2019-06-09 | Disposition: A | Discharge status: Home / Self Care | Payer: Medicare Other | Source: Ambulatory Visit | Attending: Radiation Oncology | Admitting: Radiation Oncology

## 2019-06-09 DIAGNOSIS — Z800 Family history of malignant neoplasm of digestive organs: Secondary | ICD-10-CM | POA: Diagnosis not present

## 2019-06-09 DIAGNOSIS — K219 Gastro-esophageal reflux disease without esophagitis: Secondary | ICD-10-CM | POA: Diagnosis not present

## 2019-06-09 DIAGNOSIS — C50912 Malignant neoplasm of unspecified site of left female breast: Secondary | ICD-10-CM | POA: Diagnosis not present

## 2019-06-09 DIAGNOSIS — M25512 Pain in left shoulder: Secondary | ICD-10-CM | POA: Diagnosis not present

## 2019-06-09 DIAGNOSIS — I890 Lymphedema, not elsewhere classified: Secondary | ICD-10-CM | POA: Diagnosis not present

## 2019-06-09 DIAGNOSIS — E785 Hyperlipidemia, unspecified: Secondary | ICD-10-CM | POA: Diagnosis not present

## 2019-06-09 DIAGNOSIS — F419 Anxiety disorder, unspecified: Secondary | ICD-10-CM | POA: Diagnosis not present

## 2019-06-09 DIAGNOSIS — Z170 Estrogen receptor positive status [ER+]: Secondary | ICD-10-CM | POA: Diagnosis not present

## 2019-06-09 DIAGNOSIS — Z7982 Long term (current) use of aspirin: Secondary | ICD-10-CM | POA: Diagnosis not present

## 2019-06-09 DIAGNOSIS — D649 Anemia, unspecified: Secondary | ICD-10-CM | POA: Diagnosis not present

## 2019-06-09 DIAGNOSIS — F17210 Nicotine dependence, cigarettes, uncomplicated: Secondary | ICD-10-CM | POA: Diagnosis not present

## 2019-06-09 DIAGNOSIS — Z79899 Other long term (current) drug therapy: Secondary | ICD-10-CM | POA: Diagnosis not present

## 2019-06-09 DIAGNOSIS — I471 Supraventricular tachycardia: Secondary | ICD-10-CM | POA: Diagnosis not present

## 2019-06-09 DIAGNOSIS — Z923 Personal history of irradiation: Secondary | ICD-10-CM | POA: Diagnosis not present

## 2019-06-09 DIAGNOSIS — Z510 Encounter for antineoplastic radiation therapy: Secondary | ICD-10-CM | POA: Diagnosis not present

## 2019-06-09 DIAGNOSIS — Z801 Family history of malignant neoplasm of trachea, bronchus and lung: Secondary | ICD-10-CM | POA: Diagnosis not present

## 2019-06-09 DIAGNOSIS — I700 Atherosclerosis of aorta: Secondary | ICD-10-CM | POA: Diagnosis not present

## 2019-06-09 DIAGNOSIS — C778 Secondary and unspecified malignant neoplasm of lymph nodes of multiple regions: Secondary | ICD-10-CM | POA: Diagnosis not present

## 2019-06-09 DIAGNOSIS — Z853 Personal history of malignant neoplasm of breast: Secondary | ICD-10-CM | POA: Diagnosis not present

## 2019-06-09 DIAGNOSIS — J449 Chronic obstructive pulmonary disease, unspecified: Secondary | ICD-10-CM | POA: Diagnosis not present

## 2019-06-09 DIAGNOSIS — C7951 Secondary malignant neoplasm of bone: Secondary | ICD-10-CM | POA: Diagnosis not present

## 2019-06-09 LAB — CANCER ANTIGEN 27.29: CA 27.29: 107.4 U/mL — ABNORMAL HIGH (ref 0.0–38.6)

## 2019-06-11 ENCOUNTER — Encounter: Payer: Self-pay | Admitting: Oncology

## 2019-06-12 ENCOUNTER — Telehealth: Payer: Self-pay | Admitting: Adult Health

## 2019-06-12 ENCOUNTER — Telehealth: Payer: Self-pay | Admitting: *Deleted

## 2019-06-12 DIAGNOSIS — Z510 Encounter for antineoplastic radiation therapy: Secondary | ICD-10-CM | POA: Diagnosis not present

## 2019-06-12 DIAGNOSIS — C50912 Malignant neoplasm of unspecified site of left female breast: Secondary | ICD-10-CM | POA: Diagnosis not present

## 2019-06-12 DIAGNOSIS — Z170 Estrogen receptor positive status [ER+]: Secondary | ICD-10-CM | POA: Diagnosis not present

## 2019-06-12 DIAGNOSIS — C7951 Secondary malignant neoplasm of bone: Secondary | ICD-10-CM | POA: Diagnosis not present

## 2019-06-12 DIAGNOSIS — C778 Secondary and unspecified malignant neoplasm of lymph nodes of multiple regions: Secondary | ICD-10-CM | POA: Diagnosis not present

## 2019-06-12 DIAGNOSIS — I890 Lymphedema, not elsewhere classified: Secondary | ICD-10-CM | POA: Diagnosis not present

## 2019-06-12 NOTE — Telephone Encounter (Signed)
This message is for radiation oncology, Stacey [name]. However, this program would not allow me that choice. I hope Dr. Jana Hakim can forward to the correct person.  I am confused. The radiation schedule given to me on Friday May 9 indicates my appointment time for 5/13, 5/14, and 5/17 is 4:15 PM.  "My Chart" for these three days indicates 1:30 PM for three hours each. Please advise me which appointment time I should observe for the three days indicated 4:15 PM or 1:30 PM.  Also, if I am to be there for three hours on those three days what does that time include?  Thank you. Stacey [name] 2040-07-11

## 2019-06-12 NOTE — Progress Notes (Signed)
Patient contacted by therapist on [facility] 3 machine to clarify upcoming radiation schedule. No other questions/concerns identified at this time.

## 2019-06-12 NOTE — Telephone Encounter (Signed)
Scheduled appts per 5/6 los. Pt confirmed next appt date and time. Pt requested a print out of appt calendar at next visit, note was made in appt notes.

## 2019-06-13 NOTE — Telephone Encounter (Signed)
Patient was approved for Ibrance at no charge from [employer] 06/12/19-02/02/20.  First shipment was sent out 06/12/19.  [place] Patient [place] Phone [phone] Fax [phone] 06/13/2019 9:00 AM

## 2019-06-14 ENCOUNTER — Ambulatory Visit
Admission: RE | Admit: 2019-06-14 | Discharge: 2019-06-14 | Disposition: A | Discharge status: Home / Self Care | Payer: Medicare Other | Source: Ambulatory Visit | Attending: Radiation Oncology | Admitting: Radiation Oncology

## 2019-06-14 DIAGNOSIS — C778 Secondary and unspecified malignant neoplasm of lymph nodes of multiple regions: Secondary | ICD-10-CM | POA: Diagnosis not present

## 2019-06-14 DIAGNOSIS — Z510 Encounter for antineoplastic radiation therapy: Secondary | ICD-10-CM | POA: Diagnosis not present

## 2019-06-14 DIAGNOSIS — I890 Lymphedema, not elsewhere classified: Secondary | ICD-10-CM | POA: Diagnosis not present

## 2019-06-14 DIAGNOSIS — C7951 Secondary malignant neoplasm of bone: Secondary | ICD-10-CM | POA: Diagnosis not present

## 2019-06-14 DIAGNOSIS — Z170 Estrogen receptor positive status [ER+]: Secondary | ICD-10-CM | POA: Diagnosis not present

## 2019-06-14 DIAGNOSIS — C50912 Malignant neoplasm of unspecified site of left female breast: Secondary | ICD-10-CM | POA: Diagnosis not present

## 2019-06-15 ENCOUNTER — Ambulatory Visit
Admission: RE | Admit: 2019-06-15 | Discharge: 2019-06-15 | Disposition: A | Discharge status: Home / Self Care | Payer: Medicare Other | Source: Ambulatory Visit | Attending: Radiation Oncology | Admitting: Radiation Oncology

## 2019-06-15 ENCOUNTER — Other Ambulatory Visit: Payer: Self-pay

## 2019-06-15 DIAGNOSIS — C50912 Malignant neoplasm of unspecified site of left female breast: Secondary | ICD-10-CM | POA: Diagnosis not present

## 2019-06-15 DIAGNOSIS — C778 Secondary and unspecified malignant neoplasm of lymph nodes of multiple regions: Secondary | ICD-10-CM | POA: Diagnosis not present

## 2019-06-15 DIAGNOSIS — I890 Lymphedema, not elsewhere classified: Secondary | ICD-10-CM | POA: Diagnosis not present

## 2019-06-15 DIAGNOSIS — Z510 Encounter for antineoplastic radiation therapy: Secondary | ICD-10-CM | POA: Diagnosis not present

## 2019-06-15 DIAGNOSIS — Z170 Estrogen receptor positive status [ER+]: Secondary | ICD-10-CM | POA: Diagnosis not present

## 2019-06-15 DIAGNOSIS — C7951 Secondary malignant neoplasm of bone: Secondary | ICD-10-CM | POA: Diagnosis not present

## 2019-06-16 ENCOUNTER — Other Ambulatory Visit: Payer: Self-pay

## 2019-06-16 ENCOUNTER — Ambulatory Visit
Admission: RE | Admit: 2019-06-16 | Discharge: 2019-06-16 | Disposition: A | Discharge status: Home / Self Care | Payer: Medicare Other | Source: Ambulatory Visit | Attending: Radiation Oncology | Admitting: Radiation Oncology

## 2019-06-16 DIAGNOSIS — Z510 Encounter for antineoplastic radiation therapy: Secondary | ICD-10-CM | POA: Diagnosis not present

## 2019-06-16 DIAGNOSIS — Z170 Estrogen receptor positive status [ER+]: Secondary | ICD-10-CM | POA: Diagnosis not present

## 2019-06-16 DIAGNOSIS — C50912 Malignant neoplasm of unspecified site of left female breast: Secondary | ICD-10-CM | POA: Diagnosis not present

## 2019-06-16 DIAGNOSIS — I890 Lymphedema, not elsewhere classified: Secondary | ICD-10-CM | POA: Diagnosis not present

## 2019-06-16 DIAGNOSIS — C7951 Secondary malignant neoplasm of bone: Secondary | ICD-10-CM | POA: Diagnosis not present

## 2019-06-16 DIAGNOSIS — C778 Secondary and unspecified malignant neoplasm of lymph nodes of multiple regions: Secondary | ICD-10-CM | POA: Diagnosis not present

## 2019-06-19 ENCOUNTER — Ambulatory Visit
Admission: RE | Admit: 2019-06-19 | Discharge: 2019-06-19 | Disposition: A | Discharge status: Home / Self Care | Payer: Medicare Other | Source: Ambulatory Visit | Attending: Radiation Oncology | Admitting: Radiation Oncology

## 2019-06-19 ENCOUNTER — Telehealth: Payer: Self-pay | Admitting: Licensed Clinical Social Worker

## 2019-06-19 ENCOUNTER — Other Ambulatory Visit: Payer: Self-pay

## 2019-06-19 DIAGNOSIS — Z170 Estrogen receptor positive status [ER+]: Secondary | ICD-10-CM | POA: Diagnosis not present

## 2019-06-19 DIAGNOSIS — I890 Lymphedema, not elsewhere classified: Secondary | ICD-10-CM | POA: Diagnosis not present

## 2019-06-19 DIAGNOSIS — C50912 Malignant neoplasm of unspecified site of left female breast: Secondary | ICD-10-CM | POA: Diagnosis not present

## 2019-06-19 DIAGNOSIS — C7951 Secondary malignant neoplasm of bone: Secondary | ICD-10-CM | POA: Diagnosis not present

## 2019-06-19 DIAGNOSIS — Z510 Encounter for antineoplastic radiation therapy: Secondary | ICD-10-CM | POA: Diagnosis not present

## 2019-06-19 DIAGNOSIS — C778 Secondary and unspecified malignant neoplasm of lymph nodes of multiple regions: Secondary | ICD-10-CM | POA: Diagnosis not present

## 2019-06-20 ENCOUNTER — Ambulatory Visit
Admission: RE | Admit: 2019-06-20 | Discharge: 2019-06-20 | Disposition: A | Discharge status: Home / Self Care | Payer: Medicare Other | Source: Ambulatory Visit | Attending: Radiation Oncology | Admitting: Radiation Oncology

## 2019-06-20 ENCOUNTER — Other Ambulatory Visit: Payer: Self-pay

## 2019-06-20 DIAGNOSIS — Z170 Estrogen receptor positive status [ER+]: Secondary | ICD-10-CM | POA: Diagnosis not present

## 2019-06-20 DIAGNOSIS — C7951 Secondary malignant neoplasm of bone: Secondary | ICD-10-CM | POA: Diagnosis not present

## 2019-06-20 DIAGNOSIS — I890 Lymphedema, not elsewhere classified: Secondary | ICD-10-CM | POA: Diagnosis not present

## 2019-06-20 DIAGNOSIS — C778 Secondary and unspecified malignant neoplasm of lymph nodes of multiple regions: Secondary | ICD-10-CM | POA: Diagnosis not present

## 2019-06-20 DIAGNOSIS — Z510 Encounter for antineoplastic radiation therapy: Secondary | ICD-10-CM | POA: Diagnosis not present

## 2019-06-20 DIAGNOSIS — C50912 Malignant neoplasm of unspecified site of left female breast: Secondary | ICD-10-CM | POA: Diagnosis not present

## 2019-06-21 ENCOUNTER — Encounter: Payer: Self-pay | Admitting: Licensed Clinical Social Worker

## 2019-06-21 ENCOUNTER — Ambulatory Visit: Payer: Self-pay | Admitting: Licensed Clinical Social Worker

## 2019-06-21 ENCOUNTER — Other Ambulatory Visit: Payer: Self-pay

## 2019-06-21 ENCOUNTER — Ambulatory Visit
Admission: RE | Admit: 2019-06-21 | Discharge: 2019-06-21 | Disposition: A | Discharge status: Home / Self Care | Payer: Medicare Other | Source: Ambulatory Visit | Attending: Radiation Oncology | Admitting: Radiation Oncology

## 2019-06-21 DIAGNOSIS — Z803 Family history of malignant neoplasm of breast: Secondary | ICD-10-CM

## 2019-06-21 DIAGNOSIS — Z800 Family history of malignant neoplasm of digestive organs: Secondary | ICD-10-CM

## 2019-06-21 DIAGNOSIS — Z86000 Personal history of in-situ neoplasm of breast: Secondary | ICD-10-CM

## 2019-06-21 DIAGNOSIS — Z807 Family history of other malignant neoplasms of lymphoid, hematopoietic and related tissues: Secondary | ICD-10-CM

## 2019-06-21 DIAGNOSIS — I890 Lymphedema, not elsewhere classified: Secondary | ICD-10-CM | POA: Diagnosis not present

## 2019-06-21 DIAGNOSIS — Z7189 Other specified counseling: Secondary | ICD-10-CM | POA: Insufficient documentation

## 2019-06-21 DIAGNOSIS — Z1379 Encounter for other screening for genetic and chromosomal anomalies: Secondary | ICD-10-CM

## 2019-06-21 DIAGNOSIS — C50912 Malignant neoplasm of unspecified site of left female breast: Secondary | ICD-10-CM

## 2019-06-21 DIAGNOSIS — Z801 Family history of malignant neoplasm of trachea, bronchus and lung: Secondary | ICD-10-CM

## 2019-06-21 DIAGNOSIS — Z510 Encounter for antineoplastic radiation therapy: Secondary | ICD-10-CM | POA: Diagnosis not present

## 2019-06-21 DIAGNOSIS — Z170 Estrogen receptor positive status [ER+]: Secondary | ICD-10-CM | POA: Diagnosis not present

## 2019-06-21 DIAGNOSIS — Z8049 Family history of malignant neoplasm of other genital organs: Secondary | ICD-10-CM

## 2019-06-21 DIAGNOSIS — C7951 Secondary malignant neoplasm of bone: Secondary | ICD-10-CM | POA: Diagnosis not present

## 2019-06-21 DIAGNOSIS — C778 Secondary and unspecified malignant neoplasm of lymph nodes of multiple regions: Secondary | ICD-10-CM | POA: Diagnosis not present

## 2019-06-21 NOTE — Progress Notes (Signed)
HPI:  Stacey [name] was previously seen in the [facility] clinic due to a personal and family history of cancer and concerns regarding a hereditary predisposition to cancer. Please refer to our prior cancer genetics clinic note for more information regarding our discussion, assessment and recommendations, at the time. Stacey [name] recent genetic test results were disclosed to her, as were recommendations warranted by these results. These results and recommendations are discussed in more detail below.  CANCER HISTORY:  Oncology History  Malignant neoplasm of upper-inner quadrant of left breast in female, estrogen receptor positive (Stacey [name])  11/1995 Cancer Diagnosis   History of IDC of left breast, grade 2, ER/PR+, 1/15 LN positive for malignancy, s/p doxorubicin and cyclophosphamide x 4 cycles followed by adjuvant RT to left breast and adjuvant tamoxifen x 7 years Stacey [name])   11/14/2010 Initial Biopsy   Right breast needle core bx: IMC/MCIS, grade 2 , ER/PR+, HER2/neu negative   12/2010 -  Neo-Adjuvant Anti-estrogen oral therapy   letrozole 2.5 mg daily; changed to tamoxifen 20 mg daily Feb, 2013 due to osteopenia   09/03/2011 Pathologic Stage   Stage IIB: ypT2 ypN1a   09/03/2011 Definitive Surgery   Right lumpectomy/SLNB: ILC with calcifications, grade 2, 1 sentinel LN positive for malignancy   10/19/2011 - 12/03/2011 Radiation Therapy   Adjuvant RT to right breast Stacey [name])   06/15/2019 Genetic Testing   Negative genetic testing. No pathogenic variants identified on the [facility] Common Hereditary Cancers Panel. The report date is 06/15/2019.  The Common Hereditary Cancers Panel offered by [facility] includes sequencing and/or deletion duplication testing of the following 48 genes: APC, ATM, AXIN2, BARD1, BMPR1A, BRCA1, BRCA2, BRIP1, CDH1, CDKN2A (p14ARF), CDKN2A (p16INK4a), CKD4, CHEK2, CTNNA1, DICER1, EPCAM (Deletion/duplication testing only), GREM1 (promoter region deletion/duplication  testing only), KIT, MEN1, MLH1, MSH2, MSH3, MSH6, MUTYH, NBN, NF1, NHTL1, PALB2, PDGFRA, PMS2, POLD1, POLE, PTEN, RAD50, RAD51C, RAD51D, RNF43, SDHB, SDHC, SDHD, SMAD4, SMARCA4. STK11, TP53, TSC1, TSC2, and VHL.  The following genes were evaluated for sequence changes only: SDHA and HOXB13 c.251G>A variant only.     FAMILY HISTORY:  We obtained a detailed, 4-generation family history.  Significant diagnoses are listed below: Family History  Problem Relation Age of Onset  . Hypertension Mother   . Stroke Mother   . Colon polyps Mother   . Diabetes Mother   . Pancreatic cancer Mother 13  . Uterine cancer Mother 65  . Lymphoma Brother        burkitts  . Lung cancer Paternal Uncle   . Lung cancer Maternal Grandmother 12       non-smoker  . Lung cancer Maternal Grandfather   . Breast cancer Cousin        maternal cousin, dx in her mid 65s  . Brain cancer Cousin        maternal cousin's son; dx in his 77s  . Testicular cancer Cousin        maternal cousin's son;   . Breast cancer Cousin        paternal cousin; dx in her 21s  . Breast cancer Cousin        paternal cousin's daughter; dx in 74s; neg genetic testing  . Colon cancer Neg Hx   . Esophageal cancer Neg Hx   . Stomach cancer Neg Hx   . Rectal cancer Neg Hx     Stacey [name] does not have children. She had one brother who was diagnosed with lymphoma at 52 and died at 75.  Stacey [name] mother was diagnosed with endometrial cancer at 1 and pancreatic cancer at 34. Patient had 1 maternal uncle, 1 maternal aunt, no cancers. A maternal cousin had breast cancer in her 69s. Both maternal grandparents had lung cancer in their 33s.  Stacey [name] father died at 31. Patient had 3 paternal aunts, 3 paternal uncles. One uncle had lung cancer at 62. A paternal cousin had breast cancer at 48 and a paternal first cousin once removed had breast cancer in her 33s and reportedly negative genetic tesitng. Paternal grandfather died at 4,  grandmother died at 13.  Stacey [name] is aware of previous family history of genetic testing for hereditary cancer risks. Patient's maternal ancestors are of [place] descent, and paternal ancestors are of [place], [place], [place] descent. There is no reported Ashkenazi Jewish ancestry. There is no known consanguinity.  GENETIC TEST RESULTS: Genetic testing reported out on 06/15/2019 through the [facility] Common Hereditary cancer panel found no pathogenic mutations. The Common Hereditary Cancers Panel offered by [facility] includes sequencing and/or deletion duplication testing of the following 48 genes: APC, ATM, AXIN2, BARD1, BMPR1A, BRCA1, BRCA2, BRIP1, CDH1, CDKN2A (p14ARF), CDKN2A (p16INK4a), CKD4, CHEK2, CTNNA1, DICER1, EPCAM (Deletion/duplication testing only), GREM1 (promoter region deletion/duplication testing only), KIT, MEN1, MLH1, MSH2, MSH3, MSH6, MUTYH, NBN, NF1, NHTL1, PALB2, PDGFRA, PMS2, POLD1, POLE, PTEN, RAD50, RAD51C, RAD51D, RNF43, SDHB, SDHC, SDHD, SMAD4, SMARCA4. STK11, TP53, TSC1, TSC2, and VHL.  The following genes were evaluated for sequence changes only: SDHA and HOXB13 c.251G>A variant only. The test report has been scanned into EPIC and is located under the Molecular Pathology section of the Results Review tab.  A portion of the result report is included below for reference.     We discussed with Stacey [name] that because current genetic testing is not perfect, it is possible there may be a gene mutation in one of these genes that current testing cannot detect, but that chance is small.  We also discussed, that there could be another gene that has not yet been discovered, or that we have not yet tested, that is responsible for the cancer diagnoses in the family. It is also possible there is a hereditary cause for the cancer in the family that Stacey [name] did not inherit and therefore was not identified in her testing.  Therefore, it is important to remain in touch with cancer genetics in the  future so that we can continue to offer Stacey [name] the most up to date genetic testing.   ADDITIONAL GENETIC TESTING: We discussed with Stacey [name] that her genetic testing was fairly extensive.  If there are genes identified to increase cancer risk that can be analyzed in the future, we would be happy to discuss and coordinate this testing at that time.    CANCER SCREENING RECOMMENDATIONS: Stacey [name] test result is considered negative (normal).  This means that we have not identified a hereditary cause for her  personal and family history of cancer at this time. Most cancers happen by chance and this negative test suggests that her cancer may fall into this category.    While reassuring, this does not definitively rule out a hereditary predisposition to cancer. It is still possible that there could be genetic mutations that are undetectable by current technology. There could be genetic mutations in genes that have not been tested or identified to increase cancer risk.  Therefore, it is recommended she continue to follow the cancer management and screening guidelines provided by her oncology and primary  healthcare provider.   An individual's cancer risk and medical management are not determined by genetic test results alone. Overall cancer risk assessment incorporates additional factors, including personal medical history, family history, and any available genetic information that may result in a personalized plan for cancer prevention and surveillance.  RECOMMENDATIONS FOR FAMILY MEMBERS:  Relatives in this family might be at some increased risk of developing cancer, over the general population risk, simply due to the family history of cancer.  We recommended female relatives in this family have a yearly mammogram beginning at age 39, or 52 years younger than the earliest onset of cancer, an annual clinical breast exam, and perform monthly breast self-exams. Female relatives in this family should also have  a gynecological exam as recommended by their primary provider. All family members should have a colonoscopy by age 42, or as directed by their physicians.   It is also possible there is a hereditary cause for the cancer in Stacey [name]'s family that she did not inherit and therefore was not identified in her.  Based on Stacey [name] family history, we recommended maternal relatives have genetic counseling and testing. Stacey [name] will let us know if we can be of any assistance in coordinating genetic counseling and/or testing for these family members.  FOLLOW-UP: Lastly, we discussed with Stacey [name] that cancer genetics is a rapidly advancing field and it is possible that new genetic tests will be appropriate for her and/or her family members in the future. We encouraged her to remain in contact with cancer genetics on an annual basis so we can update her personal and family histories and let her know of advances in cancer genetics that may benefit this family.   Our contact number was provided. Stacey [name] questions were answered to her satisfaction, and she knows she is welcome to call us at anytime with additional questions or concerns.   Faith Rogue, MS, [facility] Genetic Counselor [email or web address] Phone: [phone]

## 2019-06-21 NOTE — Telephone Encounter (Signed)
Revealed negative genetic testing.   We discussed that we do not know why she has had breast cancer or why there is cancer in the family. It could be due to a different gene that we are not testing, or something our current technology cannot pick up.  It will be important for her to keep in contact with genetics to learn if additional testing may be needed in the future. ° °

## 2019-06-22 ENCOUNTER — Other Ambulatory Visit: Payer: Self-pay

## 2019-06-22 ENCOUNTER — Ambulatory Visit
Admission: RE | Admit: 2019-06-22 | Discharge: 2019-06-22 | Disposition: A | Discharge status: Home / Self Care | Payer: Medicare Other | Source: Ambulatory Visit | Attending: Radiation Oncology | Admitting: Radiation Oncology

## 2019-06-22 DIAGNOSIS — Z170 Estrogen receptor positive status [ER+]: Secondary | ICD-10-CM | POA: Diagnosis not present

## 2019-06-22 DIAGNOSIS — C7951 Secondary malignant neoplasm of bone: Secondary | ICD-10-CM | POA: Diagnosis not present

## 2019-06-22 DIAGNOSIS — C50912 Malignant neoplasm of unspecified site of left female breast: Secondary | ICD-10-CM | POA: Diagnosis not present

## 2019-06-22 DIAGNOSIS — Z510 Encounter for antineoplastic radiation therapy: Secondary | ICD-10-CM | POA: Diagnosis not present

## 2019-06-22 DIAGNOSIS — I890 Lymphedema, not elsewhere classified: Secondary | ICD-10-CM | POA: Diagnosis not present

## 2019-06-22 DIAGNOSIS — C778 Secondary and unspecified malignant neoplasm of lymph nodes of multiple regions: Secondary | ICD-10-CM | POA: Diagnosis not present

## 2019-06-23 ENCOUNTER — Other Ambulatory Visit: Payer: Self-pay

## 2019-06-23 ENCOUNTER — Ambulatory Visit
Admission: RE | Admit: 2019-06-23 | Discharge: 2019-06-23 | Disposition: A | Discharge status: Home / Self Care | Payer: Medicare Other | Source: Ambulatory Visit | Attending: Radiation Oncology | Admitting: Radiation Oncology

## 2019-06-23 DIAGNOSIS — Z170 Estrogen receptor positive status [ER+]: Secondary | ICD-10-CM | POA: Diagnosis not present

## 2019-06-23 DIAGNOSIS — Z510 Encounter for antineoplastic radiation therapy: Secondary | ICD-10-CM | POA: Diagnosis not present

## 2019-06-23 DIAGNOSIS — C50912 Malignant neoplasm of unspecified site of left female breast: Secondary | ICD-10-CM | POA: Diagnosis not present

## 2019-06-23 DIAGNOSIS — C7951 Secondary malignant neoplasm of bone: Secondary | ICD-10-CM | POA: Diagnosis not present

## 2019-06-23 DIAGNOSIS — I890 Lymphedema, not elsewhere classified: Secondary | ICD-10-CM | POA: Diagnosis not present

## 2019-06-23 DIAGNOSIS — C778 Secondary and unspecified malignant neoplasm of lymph nodes of multiple regions: Secondary | ICD-10-CM | POA: Diagnosis not present

## 2019-06-26 ENCOUNTER — Encounter: Payer: Self-pay | Admitting: Oncology

## 2019-06-26 ENCOUNTER — Other Ambulatory Visit: Payer: Self-pay | Admitting: *Deleted

## 2019-06-26 ENCOUNTER — Other Ambulatory Visit: Payer: Self-pay

## 2019-06-26 ENCOUNTER — Ambulatory Visit
Admission: RE | Admit: 2019-06-26 | Discharge: 2019-06-26 | Disposition: A | Discharge status: Home / Self Care | Payer: Medicare Other | Source: Ambulatory Visit | Attending: Radiation Oncology | Admitting: Radiation Oncology

## 2019-06-26 ENCOUNTER — Ambulatory Visit: Payer: Medicare Other | Admitting: Internal Medicine

## 2019-06-26 DIAGNOSIS — C7951 Secondary malignant neoplasm of bone: Secondary | ICD-10-CM | POA: Diagnosis not present

## 2019-06-26 DIAGNOSIS — C50912 Malignant neoplasm of unspecified site of left female breast: Secondary | ICD-10-CM | POA: Diagnosis not present

## 2019-06-26 DIAGNOSIS — Z510 Encounter for antineoplastic radiation therapy: Secondary | ICD-10-CM | POA: Diagnosis not present

## 2019-06-26 DIAGNOSIS — C778 Secondary and unspecified malignant neoplasm of lymph nodes of multiple regions: Secondary | ICD-10-CM | POA: Diagnosis not present

## 2019-06-26 DIAGNOSIS — Z170 Estrogen receptor positive status [ER+]: Secondary | ICD-10-CM | POA: Diagnosis not present

## 2019-06-26 DIAGNOSIS — I890 Lymphedema, not elsewhere classified: Secondary | ICD-10-CM | POA: Diagnosis not present

## 2019-06-26 MED ORDER — PROCHLORPERAZINE MALEATE 10 MG PO TABS
10.0000 mg | ORAL_TABLET | Freq: Four times a day (QID) | ORAL | 1 refills | Status: DC | PRN
Start: 2019-06-26 — End: 2023-02-04

## 2019-06-27 ENCOUNTER — Encounter: Payer: Self-pay | Admitting: Oncology

## 2019-06-27 ENCOUNTER — Ambulatory Visit
Admission: RE | Admit: 2019-06-27 | Discharge: 2019-06-27 | Disposition: A | Discharge status: Home / Self Care | Payer: Medicare Other | Source: Ambulatory Visit | Attending: Radiation Oncology | Admitting: Radiation Oncology

## 2019-06-27 DIAGNOSIS — C7951 Secondary malignant neoplasm of bone: Secondary | ICD-10-CM | POA: Diagnosis not present

## 2019-06-27 DIAGNOSIS — Z510 Encounter for antineoplastic radiation therapy: Secondary | ICD-10-CM | POA: Diagnosis not present

## 2019-06-27 DIAGNOSIS — C50912 Malignant neoplasm of unspecified site of left female breast: Secondary | ICD-10-CM | POA: Diagnosis not present

## 2019-06-27 DIAGNOSIS — I890 Lymphedema, not elsewhere classified: Secondary | ICD-10-CM | POA: Diagnosis not present

## 2019-06-27 DIAGNOSIS — C778 Secondary and unspecified malignant neoplasm of lymph nodes of multiple regions: Secondary | ICD-10-CM | POA: Diagnosis not present

## 2019-06-27 DIAGNOSIS — Z170 Estrogen receptor positive status [ER+]: Secondary | ICD-10-CM | POA: Diagnosis not present

## 2019-06-28 ENCOUNTER — Other Ambulatory Visit: Payer: Self-pay

## 2019-06-28 ENCOUNTER — Ambulatory Visit
Admission: RE | Admit: 2019-06-28 | Discharge: 2019-06-28 | Disposition: A | Discharge status: Home / Self Care | Payer: Medicare Other | Source: Ambulatory Visit | Attending: Radiation Oncology | Admitting: Radiation Oncology

## 2019-06-28 DIAGNOSIS — C50912 Malignant neoplasm of unspecified site of left female breast: Secondary | ICD-10-CM | POA: Diagnosis not present

## 2019-06-28 DIAGNOSIS — C7951 Secondary malignant neoplasm of bone: Secondary | ICD-10-CM | POA: Diagnosis not present

## 2019-06-28 DIAGNOSIS — C778 Secondary and unspecified malignant neoplasm of lymph nodes of multiple regions: Secondary | ICD-10-CM | POA: Diagnosis not present

## 2019-06-28 DIAGNOSIS — Z510 Encounter for antineoplastic radiation therapy: Secondary | ICD-10-CM | POA: Diagnosis not present

## 2019-06-28 DIAGNOSIS — Z170 Estrogen receptor positive status [ER+]: Secondary | ICD-10-CM | POA: Diagnosis not present

## 2019-06-28 DIAGNOSIS — I890 Lymphedema, not elsewhere classified: Secondary | ICD-10-CM | POA: Diagnosis not present

## 2019-06-29 ENCOUNTER — Ambulatory Visit (INDEPENDENT_AMBULATORY_CARE_PROVIDER_SITE_OTHER): Payer: Medicare Other | Admitting: Internal Medicine

## 2019-06-29 ENCOUNTER — Ambulatory Visit
Admission: RE | Admit: 2019-06-29 | Discharge: 2019-06-29 | Disposition: A | Discharge status: Home / Self Care | Payer: Medicare Other | Source: Ambulatory Visit | Attending: Radiation Oncology | Admitting: Radiation Oncology

## 2019-06-29 ENCOUNTER — Encounter: Payer: Self-pay | Admitting: Internal Medicine

## 2019-06-29 ENCOUNTER — Other Ambulatory Visit: Payer: Self-pay

## 2019-06-29 VITALS — BP 110/70 | HR 92 | Temp 98.4°F | Wt 158.0 lb

## 2019-06-29 DIAGNOSIS — Z170 Estrogen receptor positive status [ER+]: Secondary | ICD-10-CM | POA: Diagnosis not present

## 2019-06-29 DIAGNOSIS — R739 Hyperglycemia, unspecified: Secondary | ICD-10-CM

## 2019-06-29 DIAGNOSIS — C7951 Secondary malignant neoplasm of bone: Secondary | ICD-10-CM | POA: Diagnosis not present

## 2019-06-29 DIAGNOSIS — C778 Secondary and unspecified malignant neoplasm of lymph nodes of multiple regions: Secondary | ICD-10-CM | POA: Diagnosis not present

## 2019-06-29 DIAGNOSIS — I10 Essential (primary) hypertension: Secondary | ICD-10-CM

## 2019-06-29 DIAGNOSIS — Z510 Encounter for antineoplastic radiation therapy: Secondary | ICD-10-CM | POA: Diagnosis not present

## 2019-06-29 DIAGNOSIS — J449 Chronic obstructive pulmonary disease, unspecified: Secondary | ICD-10-CM

## 2019-06-29 DIAGNOSIS — I890 Lymphedema, not elsewhere classified: Secondary | ICD-10-CM | POA: Diagnosis not present

## 2019-06-29 DIAGNOSIS — E781 Pure hyperglyceridemia: Secondary | ICD-10-CM

## 2019-06-29 DIAGNOSIS — C50912 Malignant neoplasm of unspecified site of left female breast: Secondary | ICD-10-CM | POA: Diagnosis not present

## 2019-06-29 NOTE — Assessment & Plan Note (Addendum)

## 2019-06-29 NOTE — Assessment & Plan Note (Signed)
stable overall by history and exam, recent data reviewed with pt, and pt to continue medical treatment as before,  to f/u any worsening symptoms or concerns  

## 2019-06-29 NOTE — Progress Notes (Signed)
Subjective:    Patient ID: Stacey [name], female    DOB: [birth year], 79 y.o.   MRN: [id number]  HPI  Here for yearly f/u;  Overall doing ok;  Pt denies Chest pain, worsening SOB, DOE, wheezing, orthopnea, PND, worsening LE edema, palpitations, dizziness or syncope.  Pt denies neurological change such as new headache, facial or extremity weakness.  Pt denies polydipsia, polyuria, or low sugar symptoms. Pt states overall good compliance with treatment and medications, good tolerability, and has been trying to follow appropriate diet.  Pt denies worsening depressive symptoms, suicidal ideation or panic. No fever, night sweats, wt loss, loss of appetite, or other constitutional symptoms.  Pt states good ability with ADL's, has low fall risk, home safety reviewed and adequate, no other significant changes in hearing or vision, and only occasionally active with exercise.  No new complints except luE lymphedema getting worse it seems and hard to control Past Medical History:  Diagnosis Date  . ANEMIA-NOS   . ANXIETY   . Breast cancer ([place]) 1997 L, 2012 R   s/p chemo/xrt  . COPD    resolved  . DIVERTICULOSIS, COLON 2008  . Dizziness   . Family history of breast cancer   . Family history of lung cancer   . Family history of lymphoma   . Family history of pancreatic cancer   . Family history of uterine cancer   . GERD   . Hx of radiation therapy 10/19/11 -12/03/11   right breast  . HYPERLIPIDEMIA   . IRRITABLE BOWEL SYNDROME, HX OF   . Left-sided carotid artery disease (HCC)    moderate left ICA stenosis  . OSTEOARTHRITIS, HAND   . PSVT (paroxysmal supraventricular tachycardia) (HCC)    symptomatic on event monitor   Past Surgical History:  Procedure Laterality Date  . ABDOMINAL HYSTERECTOMY    . APPENDECTOMY    . BREAST BIOPSY  11/14/10    r breast: inv, insitu mammary carcinoma w/calcif, er/pr +, her2 -  . BREAST SURGERY     lumpectomy  . CATARACT EXTRACTION     both eyes  .  ELECTROPHYSIOLOGIC STUDY N/A 05/10/2015   Procedure: SVT Ablation;  Surgeon: Will Meredith Leeds, MD;  Location: [facility];  Service: Cardiovascular;  Laterality: N/A;  . HERNIA REPAIR    . inguinal herniorrhapy  1984   left  . IR US GUIDE BX ASP/DRAIN  05/26/2019  . rectal fissure repair    . s/p benign breast biopsy  2003   right  . s/p left foot surgury  2009  . 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 spiral fx left foot  2008   no surgury  . TMJ ARTHROPLASTY  1989  . [place]- to remove scar tissue growth   . TONSILLECTOMY      reports that she quit smoking about 3 years ago. Her smoking use included cigarettes. She has a 54.00 pack-year smoking history. She has never used smokeless tobacco. She reports current alcohol use. She reports that she does not use drugs. family history includes Brain cancer in her cousin; Breast cancer in her cousin, cousin, and cousin; Colon polyps in her mother; Diabetes in her mother; Hypertension in her mother; Lung cancer in her maternal grandfather and paternal uncle; Lung cancer (age of onset: 59) in her maternal grandmother; Lymphoma in her brother; Pancreatic cancer (age of onset: 38) in her mother; Stroke in her mother;  Testicular cancer in her cousin; Uterine cancer (age of onset: 72) in her mother. Allergies  Allergen Reactions  . Bee Venom Swelling  . Clindamycin/Lincomycin Diarrhea and Nausea And Vomiting  . Codeine Nausea And Vomiting   Current Outpatient Medications on File Prior to Visit  Medication Sig Dispense Refill  . anastrozole (ARIMIDEX) 1 MG tablet Take 1 tablet (1 mg total) by mouth daily. 90 tablet 4  . aspirin 81 MG EC tablet Take 81 mg by mouth daily.      . calcium-vitamin D (OSCAL WITH D) 500-200 MG-UNIT per tablet Take 1 tablet by mouth daily.    . cholecalciferol (VITAMIN D) 1000 UNITS tablet Take 1,000 Units by mouth daily.      . clotrimazole-betamethasone (LOTRISONE) cream Apply 1 application  topically 2 (two) times daily as needed. 30 g 1  . diclofenac sodium (VOLTAREN) 1 % GEL Apply 4 g topically 4 (four) times daily as needed. 400 g 11  . diphenhydrAMINE (BENADRYL) 25 MG tablet Take 25 mg by mouth every 6 (six) hours as needed (for bee stings).     . fluticasone (FLONASE) 50 MCG/ACT nasal spray Place 2 sprays into both nostrils daily.    Marland Kitchen loratadine (CLARITIN) 10 MG tablet Take 10 mg by mouth daily.    . meloxicam (MOBIC) 15 MG tablet TAKE 1 TABLET(15 MG) BY MOUTH DAILY Annual appt due in June must see provider for future refills 90 tablet 0  . Multiple Vitamin (MULTIVITAMIN) capsule Take 1 capsule by mouth daily.      Marland Kitchen omeprazole (PRILOSEC) 20 MG capsule TAKE 1 CAPSULE(20 MG) BY MOUTH DAILY 90 capsule 3  . palbociclib (IBRANCE) 125 MG tablet Take 1 tablet (125 mg total) by mouth daily. Take for 21 days on, 7 days off, repeat every 28 days. (Patient not taking: Reported on 06/08/2019) 21 tablet 0  . polyethylene glycol (MIRALAX / GLYCOLAX) packet take 1 packet once daily if needed for constipation 30 packet 11  . prochlorperazine (COMPAZINE) 10 MG tablet Take 1 tablet (10 mg total) by mouth every 6 (six) hours as needed for nausea or vomiting. 30 tablet 1  . pseudoephedrine (SUDAFED) 30 MG tablet Take 30 mg by mouth every 4 (four) hours as needed for congestion.     Marland Kitchen telmisartan (MICARDIS) 40 MG tablet Take 1 tablet (40 mg total) by mouth daily. 90 tablet 3  . Tiotropium Bromide Monohydrate (SPIRIVA RESPIMAT) 2.5 MCG/ACT AERS Inhale 2 puffs into the lungs daily. 4 g 11  . zolpidem (AMBIEN) 10 MG tablet TAKE 1 TABLET(10 MG) BY MOUTH AT BEDTIME AS NEEDED 90 tablet 1   No current facility-administered medications on file prior to visit.   Review of Systems All otherwise neg per pt     Objective:   Physical Exam BP 110/70 (BP Location: Right Arm, Patient Position: Sitting, Cuff Size: Large)   Pulse 92   Temp 98.4 F (36.9 C) (Oral)   Wt 158 lb (71.7 kg)   SpO2 91%   BMI  27.33 kg/m  VS noted,  Constitutional: Pt appears in NAD HENT: Head: NCAT.  Right Ear: External ear normal.  Left Ear: External ear normal.  Eyes: . Pupils are equal, round, and reactive to light. Conjunctivae and EOM are normal Nose: without d/c or deformity Neck: Neck supple. Gross normal ROM Cardiovascular: Normal rate and regular rhythm.   Pulmonary/Chest: Effort normal and breath sounds without rales or wheezing.  Abd:  Soft, NT, ND, + BS, no organomegaly Neurological:  Pt is alert. At baseline orientation, motor grossly intact Skin: Skin is warm. No rashes, other new lesions, no LE edema Psychiatric: Pt behavior is normal without agitation  All otherwise neg per pt Lab Results  Component Value Date   WBC 8.1 06/08/2019   HGB 11.7 (L) 06/08/2019   HCT 36.0 06/08/2019   PLT 313 06/08/2019   GLUCOSE 106 (H) 06/08/2019   CHOL 193 07/13/2018   TRIG 231.0 (H) 07/13/2018   HDL 49.40 07/13/2018   LDLDIRECT 119.0 07/13/2018   LDLCALC 84 07/04/2009   ALT 14 06/08/2019   AST 24 06/08/2019   NA 146 (H) 06/08/2019   K 3.8 06/08/2019   CL 110 06/08/2019   CREATININE 1.02 (H) 06/08/2019   BUN 17 06/08/2019   CO2 23 06/08/2019   TSH 3.41 07/13/2018   INR 1.1 05/26/2019   HGBA1C 6.6 (H) 07/13/2018      Assessment & Plan:

## 2019-06-29 NOTE — Assessment & Plan Note (Signed)
stable overall by history and exam, recent data reviewed with pt, and pt to continue medical treatment as before,  to f/u any worsening symptoms or concerns, declines f/u lipids today

## 2019-06-29 NOTE — Patient Instructions (Addendum)
Please continue all other medications as before, and refills have been done if requested. ° °Please have the pharmacy call with any other refills you may need. ° °Please continue your efforts at being more active, low cholesterol diet, and weight control. ° °You are otherwise up to date with prevention measures today. ° °Please keep your appointments with your specialists as you may have planned ° °Please make an Appointment to return in 6 months, or sooner if needed °

## 2019-06-30 ENCOUNTER — Ambulatory Visit
Admission: RE | Admit: 2019-06-30 | Discharge: 2019-06-30 | Disposition: A | Discharge status: Home / Self Care | Payer: Medicare Other | Source: Ambulatory Visit | Attending: Radiation Oncology | Admitting: Radiation Oncology

## 2019-06-30 ENCOUNTER — Encounter: Payer: Self-pay | Admitting: Radiation Oncology

## 2019-06-30 DIAGNOSIS — Z510 Encounter for antineoplastic radiation therapy: Secondary | ICD-10-CM | POA: Diagnosis not present

## 2019-06-30 DIAGNOSIS — C7951 Secondary malignant neoplasm of bone: Secondary | ICD-10-CM | POA: Diagnosis not present

## 2019-06-30 DIAGNOSIS — I890 Lymphedema, not elsewhere classified: Secondary | ICD-10-CM | POA: Diagnosis not present

## 2019-06-30 DIAGNOSIS — C778 Secondary and unspecified malignant neoplasm of lymph nodes of multiple regions: Secondary | ICD-10-CM | POA: Diagnosis not present

## 2019-06-30 DIAGNOSIS — Z170 Estrogen receptor positive status [ER+]: Secondary | ICD-10-CM | POA: Diagnosis not present

## 2019-06-30 DIAGNOSIS — C50912 Malignant neoplasm of unspecified site of left female breast: Secondary | ICD-10-CM | POA: Diagnosis not present

## 2019-07-06 ENCOUNTER — Other Ambulatory Visit: Payer: Self-pay

## 2019-07-06 ENCOUNTER — Inpatient Hospital Stay: Payer: Medicare Other | Attending: Adult Health

## 2019-07-06 ENCOUNTER — Inpatient Hospital Stay: Payer: Medicare Other

## 2019-07-06 ENCOUNTER — Telehealth: Payer: Self-pay | Admitting: *Deleted

## 2019-07-06 ENCOUNTER — Encounter: Payer: Self-pay | Admitting: Adult Health

## 2019-07-06 ENCOUNTER — Inpatient Hospital Stay (HOSPITAL_BASED_OUTPATIENT_CLINIC_OR_DEPARTMENT_OTHER): Payer: Medicare Other | Admitting: Adult Health

## 2019-07-06 VITALS — BP 135/78 | HR 95 | Temp 98.5°F | Resp 18 | Ht 63.75 in | Wt 166.3 lb

## 2019-07-06 DIAGNOSIS — Z800 Family history of malignant neoplasm of digestive organs: Secondary | ICD-10-CM | POA: Insufficient documentation

## 2019-07-06 DIAGNOSIS — C50212 Malignant neoplasm of upper-inner quadrant of left female breast: Secondary | ICD-10-CM

## 2019-07-06 DIAGNOSIS — E785 Hyperlipidemia, unspecified: Secondary | ICD-10-CM | POA: Diagnosis not present

## 2019-07-06 DIAGNOSIS — C7951 Secondary malignant neoplasm of bone: Secondary | ICD-10-CM | POA: Insufficient documentation

## 2019-07-06 DIAGNOSIS — F419 Anxiety disorder, unspecified: Secondary | ICD-10-CM | POA: Diagnosis not present

## 2019-07-06 DIAGNOSIS — R197 Diarrhea, unspecified: Secondary | ICD-10-CM | POA: Diagnosis not present

## 2019-07-06 DIAGNOSIS — C7989 Secondary malignant neoplasm of other specified sites: Secondary | ICD-10-CM | POA: Insufficient documentation

## 2019-07-06 DIAGNOSIS — Z79899 Other long term (current) drug therapy: Secondary | ICD-10-CM | POA: Diagnosis not present

## 2019-07-06 DIAGNOSIS — Z79811 Long term (current) use of aromatase inhibitors: Secondary | ICD-10-CM | POA: Diagnosis not present

## 2019-07-06 DIAGNOSIS — M8580 Other specified disorders of bone density and structure, unspecified site: Secondary | ICD-10-CM | POA: Insufficient documentation

## 2019-07-06 DIAGNOSIS — C50211 Malignant neoplasm of upper-inner quadrant of right female breast: Secondary | ICD-10-CM | POA: Diagnosis not present

## 2019-07-06 DIAGNOSIS — Z170 Estrogen receptor positive status [ER+]: Secondary | ICD-10-CM | POA: Insufficient documentation

## 2019-07-06 DIAGNOSIS — M1990 Unspecified osteoarthritis, unspecified site: Secondary | ICD-10-CM | POA: Insufficient documentation

## 2019-07-06 DIAGNOSIS — Z7982 Long term (current) use of aspirin: Secondary | ICD-10-CM | POA: Diagnosis not present

## 2019-07-06 DIAGNOSIS — Z87891 Personal history of nicotine dependence: Secondary | ICD-10-CM | POA: Insufficient documentation

## 2019-07-06 DIAGNOSIS — Z90722 Acquired absence of ovaries, bilateral: Secondary | ICD-10-CM | POA: Insufficient documentation

## 2019-07-06 DIAGNOSIS — Z803 Family history of malignant neoplasm of breast: Secondary | ICD-10-CM | POA: Insufficient documentation

## 2019-07-06 DIAGNOSIS — Z923 Personal history of irradiation: Secondary | ICD-10-CM | POA: Insufficient documentation

## 2019-07-06 DIAGNOSIS — Z90710 Acquired absence of both cervix and uterus: Secondary | ICD-10-CM | POA: Diagnosis not present

## 2019-07-06 DIAGNOSIS — I471 Supraventricular tachycardia: Secondary | ICD-10-CM | POA: Diagnosis not present

## 2019-07-06 DIAGNOSIS — K219 Gastro-esophageal reflux disease without esophagitis: Secondary | ICD-10-CM | POA: Insufficient documentation

## 2019-07-06 DIAGNOSIS — Z801 Family history of malignant neoplasm of trachea, bronchus and lung: Secondary | ICD-10-CM | POA: Diagnosis not present

## 2019-07-06 DIAGNOSIS — C771 Secondary and unspecified malignant neoplasm of intrathoracic lymph nodes: Secondary | ICD-10-CM | POA: Diagnosis not present

## 2019-07-06 DIAGNOSIS — C50912 Malignant neoplasm of unspecified site of left female breast: Secondary | ICD-10-CM

## 2019-07-06 DIAGNOSIS — R9389 Abnormal findings on diagnostic imaging of other specified body structures: Secondary | ICD-10-CM

## 2019-07-06 DIAGNOSIS — C50911 Malignant neoplasm of unspecified site of right female breast: Secondary | ICD-10-CM

## 2019-07-06 LAB — CBC WITH DIFFERENTIAL/PLATELET
Abs Immature Granulocytes: 0.01 10*3/uL (ref 0.00–0.07)
Basophils Absolute: 0 10*3/uL (ref 0.0–0.1)
Basophils Relative: 1 %
Eosinophils Absolute: 0 10*3/uL (ref 0.0–0.5)
Eosinophils Relative: 1 %
HCT: 30.8 % — ABNORMAL LOW (ref 36.0–46.0)
Hemoglobin: 10.4 g/dL — ABNORMAL LOW (ref 12.0–15.0)
Immature Granulocytes: 1 %
Lymphocytes Relative: 21 %
Lymphs Abs: 0.4 10*3/uL — ABNORMAL LOW (ref 0.7–4.0)
MCH: 30.2 pg (ref 26.0–34.0)
MCHC: 33.8 g/dL (ref 30.0–36.0)
MCV: 89.5 fL (ref 80.0–100.0)
Monocytes Absolute: 0.3 10*3/uL (ref 0.1–1.0)
Monocytes Relative: 16 %
Neutro Abs: 1.3 10*3/uL — ABNORMAL LOW (ref 1.7–7.7)
Neutrophils Relative %: 60 %
Platelets: 137 10*3/uL — ABNORMAL LOW (ref 150–400)
RBC: 3.44 MIL/uL — ABNORMAL LOW (ref 3.87–5.11)
RDW: 17.6 % — ABNORMAL HIGH (ref 11.5–15.5)
WBC: 2.1 10*3/uL — ABNORMAL LOW (ref 4.0–10.5)
nRBC: 0 % (ref 0.0–0.2)

## 2019-07-06 LAB — COMPREHENSIVE METABOLIC PANEL
ALT: 15 U/L (ref 0–44)
AST: 25 U/L (ref 15–41)
Albumin: 3.6 g/dL (ref 3.5–5.0)
Alkaline Phosphatase: 55 U/L (ref 38–126)
Anion gap: 14 (ref 5–15)
BUN: 12 mg/dL (ref 8–23)
CO2: 23 mmol/L (ref 22–32)
Calcium: 6.4 mg/dL — CL (ref 8.9–10.3)
Chloride: 109 mmol/L (ref 98–111)
Creatinine, Ser: 1.02 mg/dL — ABNORMAL HIGH (ref 0.44–1.00)
GFR calc Af Amer: >60 mL/min (ref >60)
GFR calc non Af Amer: 52 mL/min — ABNORMAL LOW (ref >60)
Glucose, Bld: 107 mg/dL — ABNORMAL HIGH (ref 70–99)
Potassium: 3.1 mmol/L — ABNORMAL LOW (ref 3.5–5.1)
Sodium: 146 mmol/L — ABNORMAL HIGH (ref 135–145)
Total Bilirubin: 0.3 mg/dL (ref 0.3–1.2)
Total Protein: 6.9 g/dL (ref 6.5–8.1)

## 2019-07-06 NOTE — Telephone Encounter (Signed)
This RN received call from lab per calcium level today of 6.4.  This lab with printed CMET given to LCC/NP per pt on schedule to be seen today.

## 2019-07-06 NOTE — Progress Notes (Signed)
ID: Stacey [name]   DOB: 08-27-1940  MR#: [id number]  BXU#:[id number]  Patient Care Team: Biagio Borg, MD as PCP - General (Internal Medicine) Tyler Pita, MD as Consulting Physician (Radiation Oncology) Roseanne Kaufman, MD as Consulting Physician (Orthopedic Surgery) Lyndal Pulley, DO as Consulting Physician (Family Medicine) Magrinat, Virgie Dad, MD as Consulting Physician (Oncology) OTHER MD:   INTERVAL HISTORY:   Stacey [name] returns today for follow up of her progressive left upper extremity lymphedema.   She started her anastrozole 06/02/2019.  She is tolerating it well so far.  She has had absolutely no side effects.  She started Palbociclib about 4 weeks ago and notes that she developed explosive diarrhea.  She says that this happened several times a day, until she has nothing left to go.  She says that she has taken imodium, and that helped some.    Stacey [name] is fatigued and has decreased energy.  She had one episode of nausea and this was relieved with Prochlorperazine.  Stacey [name] is due to restart her Palbociclib tomorrow and has her pills in hand at 17m.    She started Xgeva on 5/6 and tolerated it well.  She is taking a calcium supplement.  REVIEW OF SYSTEMS: GLiviahdenies any fever, chills, chest pain, palpitations, cough, shortness of breath, vomiting, bladder changes, headaches or any further concerns.  A detailed ROS was otherwise non contributory.     HISTORY OF PRESENT ILLNESS:  From the original intake note:  " Stacey [name] a 79year old [name], who has a history of breast cancer starting in 1997 and in 2013.    On 11/22/95, she underwent a left breast biopsy, which revealed malignant cells.  She had a left breast lumpectomy with re-excision on 11/29/95, with final pathology revealed IDC, grade 2, 2.5 cm, ER 98%, PR 97%, Ki67 8% with one of 15 nodes being positive.  She received chemotherapy, 4 cycles per patient, along with radiation and took Tamoxifen for 7 years  following radiation completion.  Records from 1997 limited.    In October 2012, she had a biopsy of the right breast after mammography recommended additional images for a dense area in the right breast.  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  On 11/25/10, she had an MRI that measured the area in the right breast as 2.2 cm.  She started neoadjuvant [name] in November 2012 and has continued it since.  The area of concern measured 1.7 cm on a repeat MRI in March of 2013.  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  Surgical margins were negative with one of one sentinel lymph node positive for metastatic carcinoma.  She then underwent radiation therapy under the care of Dr. MTammi Klippelfrom 10/19/11 to 12/03/11. "   Her subsequent history is as detailed below   PAST MEDICAL HISTORY: Past Medical History:  Diagnosis Date  . ANEMIA-NOS   . ANXIETY   . Breast cancer ([place] 1997 L, 2012 R   s/p chemo/xrt  . COPD    resolved  . DIVERTICULOSIS, COLON 2008  . Dizziness   . Family history of breast cancer   . Family history of lung cancer   . Family history of lymphoma   .  Family history of pancreatic cancer   . Family history of uterine cancer   . GERD   . Hx of radiation therapy 10/19/11 -12/03/11   right breast  . HYPERLIPIDEMIA   . IRRITABLE BOWEL SYNDROME, HX OF   . Left-sided carotid artery disease (HCC)    moderate left ICA stenosis  . OSTEOARTHRITIS, HAND   . PSVT (paroxysmal supraventricular tachycardia) ([place])    symptomatic on event monitor    PAST SURGICAL HISTORY: Past Surgical History:  Procedure Laterality Date  . ABDOMINAL HYSTERECTOMY    . APPENDECTOMY    . BREAST BIOPSY  11/14/10    r  breast: inv, insitu mammary carcinoma w/calcif, er/pr +, her2 -  . BREAST SURGERY     lumpectomy  . CATARACT EXTRACTION     both eyes  . ELECTROPHYSIOLOGIC STUDY N/A 05/10/2015   Procedure: SVT Ablation;  Surgeon: Will Meredith Leeds, MD;  Location: [facility];  Service: Cardiovascular;  Laterality: N/A;  . HERNIA REPAIR    . inguinal herniorrhapy  1984   left  . IR US GUIDE BX ASP/DRAIN  05/26/2019  . rectal fissure repair    . s/p benign breast biopsy  2003   right  . s/p left foot surgury  2009  . 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 spiral fx left foot  2008   no surgury  . TMJ ARTHROPLASTY  1989  . [place]- to remove scar tissue growth   . TONSILLECTOMY      FAMILY HISTORY Family History  Problem Relation Age of Onset  . Hypertension Mother   . Stroke Mother   . Colon polyps Mother   . Diabetes Mother   . Pancreatic cancer Mother 66  . Uterine cancer Mother 17  . Lymphoma Brother        burkitts  . Lung cancer Paternal Uncle   . Lung cancer Maternal Grandmother 49       non-smoker  . Lung cancer Maternal Grandfather   . Breast cancer Cousin        maternal cousin, dx in her mid 50s  . Brain cancer Cousin        maternal cousin's son; dx in his 78s  . Testicular cancer Cousin        maternal cousin's son;   . Breast cancer Cousin        paternal cousin; dx in her 109s  . Breast cancer Cousin        paternal cousin's daughter; dx in 19s; neg genetic testing  . Colon cancer Neg Hx   . Esophageal cancer Neg Hx   . Stomach cancer Neg Hx   . Rectal cancer Neg Hx     GYNECOLOGIC HISTORY:   Menarche age 79, GX P0 (one miscarriage at 5 months). Status post vaginal hysterectomy in the late 1970s, no salpingo-oophorectomy    SOCIAL HISTORY:   The patient lives alone and is divorced. She retired from [employer], were she worked in Physiological scientist.Marland Kitchen     ADVANCED DIRECTIVES:  Living will in place; the patient's healthcare power of  attorney is her stepdaughter, Vladimir Faster, who can be reached at [place]: Social History   Tobacco Use  . Smoking status: Former Smoker    Packs/day: 1.00    Years: 54.00    Pack years: 54.00    Types: Cigarettes    Quit date: 01/03/2016  Years since quitting: 3.5  . Smokeless tobacco: Never Used  Substance Use Topics  . Alcohol use: Yes    Comment: rare/ drinks socially  . Drug use: No    Allergies  Allergen Reactions  . Bee Venom Swelling  . Clindamycin/Lincomycin Diarrhea and Nausea And Vomiting  . Codeine Nausea And Vomiting    Current Outpatient Medications  Medication Sig Dispense Refill  . anastrozole (ARIMIDEX) 1 MG tablet Take 1 tablet (1 mg total) by mouth daily. 90 tablet 4  . aspirin 81 MG EC tablet Take 81 mg by mouth daily.      . calcium-vitamin D (OSCAL WITH D) 500-200 MG-UNIT per tablet Take 1 tablet by mouth daily.    . cholecalciferol (VITAMIN D) 1000 UNITS tablet Take 1,000 Units by mouth daily.      . clotrimazole-betamethasone (LOTRISONE) cream Apply 1 application topically 2 (two) times daily as needed. 30 g 1  . diclofenac sodium (VOLTAREN) 1 % GEL Apply 4 g topically 4 (four) times daily as needed. 400 g 11  . diphenhydrAMINE (BENADRYL) 25 MG tablet Take 25 mg by mouth every 6 (six) hours as needed (for bee stings).     . fluticasone (FLONASE) 50 MCG/ACT nasal spray Place 2 sprays into both nostrils daily.    Marland Kitchen loratadine (CLARITIN) 10 MG tablet Take 10 mg by mouth daily.    . meloxicam (MOBIC) 15 MG tablet TAKE 1 TABLET(15 MG) BY MOUTH DAILY Annual appt due in June must see provider for future refills 90 tablet 0  . Multiple Vitamin (MULTIVITAMIN) capsule Take 1 capsule by mouth daily.      Marland Kitchen omeprazole (PRILOSEC) 20 MG capsule TAKE 1 CAPSULE(20 MG) BY MOUTH DAILY 90 capsule 3  . palbociclib (IBRANCE) 125 MG tablet Take 1 tablet (125 mg total) by mouth daily. Take for 21 days on, 7 days off, repeat every 28 days. 21 tablet  0  . polyethylene glycol (MIRALAX / GLYCOLAX) packet take 1 packet once daily if needed for constipation 30 packet 11  . prochlorperazine (COMPAZINE) 10 MG tablet Take 1 tablet (10 mg total) by mouth every 6 (six) hours as needed for nausea or vomiting. 30 tablet 1  . pseudoephedrine (SUDAFED) 30 MG tablet Take 30 mg by mouth every 4 (four) hours as needed for congestion.     Marland Kitchen telmisartan (MICARDIS) 40 MG tablet Take 1 tablet (40 mg total) by mouth daily. 90 tablet 3  . Tiotropium Bromide Monohydrate (SPIRIVA RESPIMAT) 2.5 MCG/ACT AERS Inhale 2 puffs into the lungs daily. 4 g 11  . zolpidem (AMBIEN) 10 MG tablet TAKE 1 TABLET(10 MG) BY MOUTH AT BEDTIME AS NEEDED 90 tablet 1   No current facility-administered medications for this visit.    OBJECTIVE: Vitals:   07/06/19 1318  BP: 135/78  Pulse: 95  Resp: 18  Temp: 98.5 F (36.9 C)  SpO2: 100%     Body mass index is 28.77 kg/m.    GENERAL: Patient is a well appearing female in no acute distress HEENT:  Sclerae anicteric.  Oropharynx clear and moist. No ulcerations or evidence of oropharyngeal candidiasis. Neck is supple.  NODES:  No cervical, supraclavicular, or axillary lymphadenopathy palpated.  BREAST EXAM:  Deferred. LUNGS:  Clear to auscultation bilaterally.  No wheezes or rhonchi. HEART:  Regular rate and rhythm. No murmur appreciated. ABDOMEN:  Soft, nontender.  Positive, normoactive bowel sounds. No organomegaly palpated. MSK:  No focal spinal tenderness to palpation. Full range of motion bilaterally in the  upper extremities. EXTREMITIES:  No peripheral edema.   SKIN:  Clear with no obvious rashes or skin changes. No nail dyscrasia. NEURO:  Nonfocal. Well oriented.  Appropriate affect.     LAB RESULTS: Lab Results  Component Value Date   WBC 2.1 (L) 07/06/2019   NEUTROABS 1.3 (L) 07/06/2019   HGB 10.4 (L) 07/06/2019   HCT 30.8 (L) 07/06/2019   MCV 89.5 07/06/2019   PLT 137 (L) 07/06/2019      Chemistry       Component Value Date/Time   NA 146 (H) 07/06/2019 1302   NA 140 12/09/2015 1409   K 3.1 (L) 07/06/2019 1302   K 4.2 12/09/2015 1409   CL 109 07/06/2019 1302   CL 107 02/02/2012 0939   CO2 23 07/06/2019 1302   CO2 25 12/09/2015 1409   BUN 12 07/06/2019 1302   BUN 16.7 12/09/2015 1409   CREATININE 1.02 (H) 07/06/2019 1302   CREATININE 1.02 (H) 05/09/2019 1303   CREATININE 0.8 12/09/2015 1409      Component Value Date/Time   CALCIUM 6.4 (LL) 07/06/2019 1302   CALCIUM 9.4 12/09/2015 1409   ALKPHOS 55 07/06/2019 1302   ALKPHOS 54 12/09/2015 1409   AST 25 07/06/2019 1302   AST 23 05/09/2019 1303   AST 20 12/09/2015 1409   ALT 15 07/06/2019 1302   ALT 13 05/09/2019 1303   ALT 13 12/09/2015 1409   BILITOT 0.3 07/06/2019 1302   BILITOT 0.4 05/09/2019 1303   BILITOT <0.22 12/09/2015 1409       Lab Results  Component Value Date   LABCA2 56 (H) 12/10/2010    No components found for: [id number]  No results for input(s): INR in the last 168 hours.  Urinalysis    Component Value Date/Time   COLORURINE YELLOW 07/13/2018 0938   APPEARANCEUR Sl Cloud[place] (A) 07/13/2018 0938   LABSPEC 1.020 07/13/2018 0938   PHURINE 6.0 07/13/2018 0938   GLUCOSEU NEGATIVE 07/13/2018 0938   HGBUR NEGATIVE 07/13/2018 0938   BILIRUBINUR NEGATIVE 07/13/2018 0938   KETONESUR NEGATIVE 07/13/2018 0938   UROBILINOGEN 0.2 07/13/2018 0938   NITRITE NEGATIVE 07/13/2018 0938   LEUKOCYTESUR TRACE (A) 07/13/2018 0938    STUDIES: No results found.   ASSESSMENT: 79 y.o. 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  #2 status post 4 cycles of doxorubicin and cyclophosphamide,    (i) followed by radiation therapy under the care of Dr. [name].  #3 received tamoxifen for a total of seven years   RIGHT BREAST #4  S/P  biopsy of the RIGHT breast upper inner quadrant on 11/14/10 showing invasive ductal carcinoma,, grade 2, estrogen receptor 85% and progesterone receptor 57% positive, Ki67 20%, HER2 not amplified.    #5 started neoadjuvant letrozole in November 2012; switched to tamoxifen as of February 2016 due to osteopenia concerns  #6  S/P right lumpectomy with sentinel lymph node biopsy on 09/03/11 for a ypT2, ypN1a, stage IIB invasive lobular carcinoma, grade 2,estrogen receptor 97% positive, progesterone receptor 12% positive, with no HER-2 amplification.  #7 status post right breast radiation therapy under the care of Dr. [name] from 10/19/2011 to 12/03/2011.   #8 did not meet criteria for genetic testing according to her insurance company.  #9 osteopenia, with a T score of -1.8 on DEXA scan [facility] 03/07/2013  (  a) status post multiple dental extractions and implants  (b) repeat bone density at [facility] 04/02/2015 shows a T score of -2.0  METASTATIC DISEASE: April 2021 #10:  left upper extremity lymphedema led to chest CT scan 05/09/2019 showing a 5.1 cm left subpectoral chest wall mass and thoracic lymphadenopathy   (a) CT biopsy of the chest wall mass 05/25/2019 confirms recurrent breast cancer, strongly estrogen and progesterone receptor positive, HER-2 not amplified  (b) PET scan 05/30/2019 shows a left subpectoral mass measuring 5.4 cm, with significant regional and mediastinal adenopathy, sclerotic left scapular metastasis, but no liver or lung involvement.  (c) CA 27-29 is informative  #11 anastrozole started 06/02/2019; palbociclib to be added 06/08/2019  #12 denosumab/Xgeva starting 06/08/2019, to be repeated every 28 days  #13 genetics testing pending   PLAN:   Nikala is doing moderately well today. Unfortunately seh has developed significant diarrhea and this has been frequent.  Her ANC is 1.3, and her calcium level has declined to 6.4, her potassium 3.1.    I reviewed the above with Dr.  Jana Hakim.  She will hold the palbociclib x 1 week, we will get a c diff sample on her, I let her know that this is a precaution, as 24-26% of patients on palbociclib develop diarrhea.  She will also not receive Xgeva today.  I let her know to increase her calcium to two tablets per day instead of one, and we reviewed foods rich in potassium for her to start eating.  We will see Londen back in 1 week for labs and f/u with Dr. Jana Hakim.  She was recommended to continue with the appropriate pandemic precautions. She knows to call for any questions that may arise between now and her next appointment.  We are happy to see her sooner if needed.   Total encounter time today 30 minutes.Wilber Bihari, NP 07/06/19 1:52 PM Medical Oncology and Hematology [facility] [place], [place] [address] Tel. [phone]    Fax. [phone]    *Total Encounter Time as defined by the Centers for Medicare and Medicaid Services includes, in addition to the face-to-face time of a patient visit (documented in the note above) non-face-to-face time: obtaining and reviewing outside history, ordering and reviewing medications, tests or procedures, care coordination (communications with other health care professionals or caregivers) and documentation in the medical record.

## 2019-07-07 ENCOUNTER — Telehealth: Payer: Self-pay | Admitting: *Deleted

## 2019-07-07 ENCOUNTER — Encounter: Payer: Self-pay | Admitting: Oncology

## 2019-07-07 ENCOUNTER — Other Ambulatory Visit: Payer: Self-pay | Admitting: Oncology

## 2019-07-07 LAB — CANCER ANTIGEN 27.29: CA 27.29: 79.7 U/mL — ABNORMAL HIGH (ref 0.0–38.6)

## 2019-07-07 NOTE — Telephone Encounter (Signed)
This RN called pt at 130 pm per reviewed my chart message - at this time she states the symptoms have improved.  She states it was over the night and early this AM that she felt so weak and dizzy with nausea.  She states she has had 3 small very soft stools but they are not as watery as yesterday.  She is taking the calcium as discussed.  She is drinking primarily water and uses crystal light to flavor.  This RN discussed above - including need to avoid too much crystal light because in some patient the inert sugar can act as a laxative.  It is best for her to do plain water or sweeten with lemon or such as well as presently she would benefit with use of gatorade or some fluid with real sugar and then some chicken broth to help replace fluids.  She states she was unable to do the stool sample yesterday and needed to know how to proceed.  This RN stated sample can be obtained next week if the diarrhea worsens or continues.  This RN advised pt to call on call MD over the weekend if her symptoms do not continue to improve for further instructions.  This RN also advised pt to call on Monday with update.  No further needs at this time.

## 2019-07-10 ENCOUNTER — Telehealth: Payer: Self-pay | Admitting: Oncology

## 2019-07-10 ENCOUNTER — Encounter: Payer: Self-pay | Admitting: Oncology

## 2019-07-10 NOTE — Telephone Encounter (Signed)
Scheduled appt per 6/4 sch message- unable to reach pt .left message with appt date and time   

## 2019-07-12 ENCOUNTER — Inpatient Hospital Stay (HOSPITAL_BASED_OUTPATIENT_CLINIC_OR_DEPARTMENT_OTHER): Payer: Medicare Other | Admitting: Adult Health

## 2019-07-12 ENCOUNTER — Other Ambulatory Visit: Payer: Self-pay

## 2019-07-12 ENCOUNTER — Inpatient Hospital Stay: Payer: Medicare Other

## 2019-07-12 VITALS — BP 161/73 | HR 104 | Temp 98.2°F | Resp 18 | Ht 63.75 in | Wt 165.9 lb

## 2019-07-12 DIAGNOSIS — C50212 Malignant neoplasm of upper-inner quadrant of left female breast: Secondary | ICD-10-CM

## 2019-07-12 DIAGNOSIS — C7951 Secondary malignant neoplasm of bone: Secondary | ICD-10-CM

## 2019-07-12 DIAGNOSIS — C7989 Secondary malignant neoplasm of other specified sites: Secondary | ICD-10-CM | POA: Diagnosis not present

## 2019-07-12 DIAGNOSIS — R9389 Abnormal findings on diagnostic imaging of other specified body structures: Secondary | ICD-10-CM

## 2019-07-12 DIAGNOSIS — C50211 Malignant neoplasm of upper-inner quadrant of right female breast: Secondary | ICD-10-CM

## 2019-07-12 DIAGNOSIS — C50911 Malignant neoplasm of unspecified site of right female breast: Secondary | ICD-10-CM

## 2019-07-12 DIAGNOSIS — Z170 Estrogen receptor positive status [ER+]: Secondary | ICD-10-CM

## 2019-07-12 LAB — COMPREHENSIVE METABOLIC PANEL
ALT: 16 U/L (ref 0–44)
AST: 23 U/L (ref 15–41)
Albumin: 3.5 g/dL (ref 3.5–5.0)
Alkaline Phosphatase: 56 U/L (ref 38–126)
Anion gap: 15 (ref 5–15)
BUN: 13 mg/dL (ref 8–23)
CO2: 23 mmol/L (ref 22–32)
Calcium: 7.8 mg/dL — ABNORMAL LOW (ref 8.9–10.3)
Chloride: 107 mmol/L (ref 98–111)
Creatinine, Ser: 0.9 mg/dL (ref 0.44–1.00)
GFR calc Af Amer: >60 mL/min (ref >60)
GFR calc non Af Amer: >60 mL/min (ref >60)
Glucose, Bld: 118 mg/dL — ABNORMAL HIGH (ref 70–99)
Potassium: 3.6 mmol/L (ref 3.5–5.1)
Sodium: 145 mmol/L (ref 135–145)
Total Bilirubin: 0.3 mg/dL (ref 0.3–1.2)
Total Protein: 6.9 g/dL (ref 6.5–8.1)

## 2019-07-12 LAB — CBC WITH DIFFERENTIAL/PLATELET
Abs Immature Granulocytes: 0.02 10*3/uL (ref 0.00–0.07)
Basophils Absolute: 0.1 10*3/uL (ref 0.0–0.1)
Basophils Relative: 2 %
Eosinophils Absolute: 0.1 10*3/uL (ref 0.0–0.5)
Eosinophils Relative: 2 %
HCT: 33.8 % — ABNORMAL LOW (ref 36.0–46.0)
Hemoglobin: 11.2 g/dL — ABNORMAL LOW (ref 12.0–15.0)
Immature Granulocytes: 1 %
Lymphocytes Relative: 18 %
Lymphs Abs: 0.6 10*3/uL — ABNORMAL LOW (ref 0.7–4.0)
MCH: 29.3 pg (ref 26.0–34.0)
MCHC: 33.1 g/dL (ref 30.0–36.0)
MCV: 88.5 fL (ref 80.0–100.0)
Monocytes Absolute: 0.8 10*3/uL (ref 0.1–1.0)
Monocytes Relative: 22 %
Neutro Abs: 1.9 10*3/uL (ref 1.7–7.7)
Neutrophils Relative %: 55 %
Platelets: 282 10*3/uL (ref 150–400)
RBC: 3.82 MIL/uL — ABNORMAL LOW (ref 3.87–5.11)
RDW: 18.3 % — ABNORMAL HIGH (ref 11.5–15.5)
WBC: 3.4 10*3/uL — ABNORMAL LOW (ref 4.0–10.5)
nRBC: 0 % (ref 0.0–0.2)

## 2019-07-12 LAB — C DIFFICILE QUICK SCREEN W PCR REFLEX
C Diff antigen: NEGATIVE
C Diff interpretation: NOT DETECTED
C Diff toxin: NEGATIVE

## 2019-07-12 MED ORDER — CHOLESTYRAMINE 4 G PO PACK: 4.0000 g | PACK | Freq: Three times a day (TID) | ORAL | 12 refills | Status: DC

## 2019-07-12 NOTE — Progress Notes (Addendum)
ID: [name]   DOB: [birth year]  MR#: [id number]  YIF#:[id number]  Patient Care Team: Biagio Borg, MD as PCP - General (Internal Medicine) Tyler Pita, MD as Consulting Physician (Radiation Oncology) Roseanne Kaufman, MD as Consulting Physician (Orthopedic Surgery) Lyndal Pulley, DO as Consulting Physician (Family Medicine) Magrinat, Virgie Dad, MD as Consulting Physician (Oncology) OTHER MD:   INTERVAL HISTORY:   Stacey [name] returns today for follow up of her progressive left upper extremity lymphedema.   She started her anastrozole 06/02/2019.  She is tolerating it well so far.  She has had absolutely no side effects.  She started Palbociclib and had diarrhea that was explosive.  She has been off for two weeks and her diarrhea is improved somewhat.    Stacey [name] notes her fatigue has improved somewhat.  She received Xgeva on 06/08/2019 and takes one calcium supplement BID.  Since receiving the xgeva, her calcium was decreased last week to 6.4.  She has since doubled her calcium, and her level has increased.    REVIEW OF SYSTEMS: Stacey [name] has been improving, and is starting to feel some better, just not 100%.  She has no fever, chills, chest pain, palpitations, cough, shortness of breath, bladder changes, or any other concerns.  A detailed ROS was otherwise non contributory.    HISTORY OF PRESENT ILLNESS:  From the original intake note:  " Stacey [name] is a 79 year old [place] woman, who has a history of breast cancer starting in 1997 and in 2013.    On 11/22/95, she underwent a left breast biopsy, which revealed malignant cells.  She had a left breast lumpectomy with re-excision on 11/29/95, with final pathology revealed IDC, grade 2, 2.5 cm, ER 98%, PR 97%, Ki67 8% with one of 15 nodes being positive.  She received chemotherapy, 4 cycles per patient, along with radiation and took Tamoxifen for 7 years following radiation completion.  Records from 1997 limited.    In October 2012, she had a  biopsy of the right breast after mammography recommended additional images for a dense area in the right breast.  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  On 11/25/10, she had an MRI that measured the area in the right breast as 2.2 cm.  She started neoadjuvant [name] in November 2012 and has continued it since.  The area of concern measured 1.7 cm on a repeat MRI in March of 2013.  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  Surgical margins were negative with one of one sentinel lymph node positive for metastatic carcinoma.  She then underwent radiation therapy under the care of Dr. [name] from 10/19/11 to 12/03/11. "   Her subsequent history is as detailed below   PAST MEDICAL HISTORY: Past Medical History:  Diagnosis Date  . ANEMIA-NOS   . ANXIETY   . Breast cancer ([facility]) 1997 L, 2012 R   s/p chemo/xrt  . COPD    resolved  . DIVERTICULOSIS, COLON 2008  . Dizziness   . Family history of breast cancer   . Family history of lung cancer   . Family history of lymphoma   . Family history of pancreatic cancer   . Family history of uterine cancer   . GERD   .  Hx of radiation therapy 10/19/11 -12/03/11   right breast  . HYPERLIPIDEMIA   . IRRITABLE BOWEL SYNDROME, HX OF   . Left-sided carotid artery disease (HCC)    moderate left ICA stenosis  . OSTEOARTHRITIS, HAND   . PSVT (paroxysmal supraventricular tachycardia) ([facility])    symptomatic on event monitor    PAST SURGICAL HISTORY: Past Surgical History:  Procedure Laterality Date  . ABDOMINAL HYSTERECTOMY    . APPENDECTOMY    . BREAST BIOPSY  11/14/10    r breast: inv, insitu mammary carcinoma w/calcif, er/pr +, her2 -  . BREAST SURGERY      lumpectomy  . CATARACT EXTRACTION     both eyes  . ELECTROPHYSIOLOGIC STUDY N/A 05/10/2015   Procedure: SVT Ablation;  Surgeon: Will Meredith Leeds, MD;  Location: [facility];  Service: Cardiovascular;  Laterality: N/A;  . HERNIA REPAIR    . inguinal herniorrhapy  1984   left  . IR US GUIDE BX ASP/DRAIN  05/26/2019  . rectal fissure repair    . s/p benign breast biopsy  2003   right  . s/p left foot surgury  2009  . 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 spiral fx left foot  2008   no surgury  . TMJ ARTHROPLASTY  1989  . [place]- to remove scar tissue growth   . TONSILLECTOMY      FAMILY HISTORY Family History  Problem Relation Age of Onset  . Hypertension Mother   . Stroke Mother   . Colon polyps Mother   . Diabetes Mother   . Pancreatic cancer Mother 82  . Uterine cancer Mother 24  . Lymphoma Brother        burkitts  . Lung cancer Paternal Uncle   . Lung cancer Maternal Grandmother 7       non-smoker  . Lung cancer Maternal Grandfather   . Breast cancer Cousin        maternal cousin, dx in her mid 58s  . Brain cancer Cousin        maternal cousin's son; dx in his 61s  . Testicular cancer Cousin        maternal cousin's son;   . Breast cancer Cousin        paternal cousin; dx in her 52s  . Breast cancer Cousin        paternal cousin's daughter; dx in 6s; neg genetic testing  . Colon cancer Neg Hx   . Esophageal cancer Neg Hx   . Stomach cancer Neg Hx   . Rectal cancer Neg Hx     GYNECOLOGIC HISTORY:   Menarche age 36, GX P0 (one miscarriage at 5 months). Status post vaginal hysterectomy in the late 1970s, no salpingo-oophorectomy    SOCIAL HISTORY:   The patient lives alone and is divorced. She retired from [employer], were she worked in Physiological scientist.Marland Kitchen     ADVANCED DIRECTIVES:  Living will in place; the patient's healthcare power of attorney is her stepdaughter, Vladimir Faster, who can be reached at [place]: Social History   Tobacco Use  . Smoking status: Former Smoker    Packs/day: 1.00    Years: 54.00    Pack years: 54.00    Types: Cigarettes    Quit date: 01/03/2016    Years since quitting: 3.5  . Smokeless tobacco: Never Used  Substance Use Topics  . Alcohol use: Yes  Comment: rare/ drinks socially  . Drug use: No    Allergies  Allergen Reactions  . Bee Venom Swelling  . Clindamycin/Lincomycin Diarrhea and Nausea And Vomiting  . Codeine Nausea And Vomiting    Current Outpatient Medications  Medication Sig Dispense Refill  . anastrozole (ARIMIDEX) 1 MG tablet Take 1 tablet (1 mg total) by mouth daily. 90 tablet 4  . aspirin 81 MG EC tablet Take 81 mg by mouth daily.      . calcium-vitamin D (OSCAL WITH D) 500-200 MG-UNIT per tablet Take 1 tablet by mouth daily.    . cholecalciferol (VITAMIN D) 1000 UNITS tablet Take 1,000 Units by mouth daily.      . clotrimazole-betamethasone (LOTRISONE) cream Apply 1 application topically 2 (two) times daily as needed. 30 g 1  . diclofenac sodium (VOLTAREN) 1 % GEL Apply 4 g topically 4 (four) times daily as needed. 400 g 11  . diphenhydrAMINE (BENADRYL) 25 MG tablet Take 25 mg by mouth every 6 (six) hours as needed (for bee stings).     . fluticasone (FLONASE) 50 MCG/ACT nasal spray Place 2 sprays into both nostrils daily.    Marland Kitchen loratadine (CLARITIN) 10 MG tablet Take 10 mg by mouth daily.    . meloxicam (MOBIC) 15 MG tablet TAKE 1 TABLET(15 MG) BY MOUTH DAILY Annual appt due in June must see provider for future refills 90 tablet 0  . Multiple Vitamin (MULTIVITAMIN) capsule Take 1 capsule by mouth daily.      Marland Kitchen omeprazole (PRILOSEC) 20 MG capsule TAKE 1 CAPSULE(20 MG) BY MOUTH DAILY 90 capsule 3  . palbociclib (IBRANCE) 125 MG tablet Take 1 tablet (125 mg total) by mouth daily. Take for 21 days on, 7 days off, repeat every 28 days. 21 tablet 0  . polyethylene glycol (MIRALAX / GLYCOLAX) packet take 1 packet once daily if needed  for constipation 30 packet 11  . prochlorperazine (COMPAZINE) 10 MG tablet Take 1 tablet (10 mg total) by mouth every 6 (six) hours as needed for nausea or vomiting. 30 tablet 1  . pseudoephedrine (SUDAFED) 30 MG tablet Take 30 mg by mouth every 4 (four) hours as needed for congestion.     Marland Kitchen telmisartan (MICARDIS) 40 MG tablet Take 1 tablet (40 mg total) by mouth daily. 90 tablet 3  . Tiotropium Bromide Monohydrate (SPIRIVA RESPIMAT) 2.5 MCG/ACT AERS Inhale 2 puffs into the lungs daily. 4 g 11  . zolpidem (AMBIEN) 10 MG tablet TAKE 1 TABLET(10 MG) BY MOUTH AT BEDTIME AS NEEDED 90 tablet 1   No current facility-administered medications for this visit.    OBJECTIVE: Vitals:   07/12/19 0958  BP: (!) 161/73  Pulse: (!) 104  Resp: 18  Temp: 98.2 F (36.8 C)  SpO2: 93%     Body mass index is 28.7 kg/m.    GENERAL: Patient is a well appearing female in no acute distress HEENT:  Sclerae anicteric. Mask in place. Neck is supple.  NODES:  No cervical, supraclavicular, or axillary lymphadenopathy palpated.  BREAST EXAM:  Deferred. LUNGS:  Clear to auscultation bilaterally.  No wheezes or rhonchi. HEART:  Regular rate and rhythm. No murmur appreciated. ABDOMEN:  Soft, nontender.  Positive, normoactive bowel sounds. No organomegaly palpated. MSK:  No focal spinal tenderness to palpation.  EXTREMITIES: left arm lymphedema SKIN:  Clear with no obvious rashes or skin changes. No nail dyscrasia. NEURO:  Nonfocal. Well oriented.  Appropriate affect.     LAB RESULTS: Lab Results  Component  Value Date   WBC 3.4 (L) 07/12/2019   NEUTROABS 1.9 07/12/2019   HGB 11.2 (L) 07/12/2019   HCT 33.8 (L) 07/12/2019   MCV 88.5 07/12/2019   PLT 282 07/12/2019      Chemistry      Component Value Date/Time   NA 146 (H) 07/06/2019 1302   NA 140 12/09/2015 1409   K 3.1 (L) 07/06/2019 1302   K 4.2 12/09/2015 1409   CL 109 07/06/2019 1302   CL 107 02/02/2012 0939   CO2 23 07/06/2019 1302   CO2 25  12/09/2015 1409   BUN 12 07/06/2019 1302   BUN 16.7 12/09/2015 1409   CREATININE 1.02 (H) 07/06/2019 1302   CREATININE 1.02 (H) 05/09/2019 1303   CREATININE 0.8 12/09/2015 1409      Component Value Date/Time   CALCIUM 6.4 (LL) 07/06/2019 1302   CALCIUM 9.4 12/09/2015 1409   ALKPHOS 55 07/06/2019 1302   ALKPHOS 54 12/09/2015 1409   AST 25 07/06/2019 1302   AST 23 05/09/2019 1303   AST 20 12/09/2015 1409   ALT 15 07/06/2019 1302   ALT 13 05/09/2019 1303   ALT 13 12/09/2015 1409   BILITOT 0.3 07/06/2019 1302   BILITOT 0.4 05/09/2019 1303   BILITOT <0.22 12/09/2015 1409       Lab Results  Component Value Date   LABCA2 56 (H) 12/10/2010    No components found for: [id number]  No results for input(s): INR in the last 168 hours.  Urinalysis    Component Value Date/Time   COLORURINE YELLOW 07/13/2018 0938   APPEARANCEUR Sl Cloud[place] (A) 07/13/2018 0938   LABSPEC 1.020 07/13/2018 0938   PHURINE 6.0 07/13/2018 0938   GLUCOSEU NEGATIVE 07/13/2018 0938   HGBUR NEGATIVE 07/13/2018 0938   BILIRUBINUR NEGATIVE 07/13/2018 0938   KETONESUR NEGATIVE 07/13/2018 0938   UROBILINOGEN 0.2 07/13/2018 0938   NITRITE NEGATIVE 07/13/2018 0938   LEUKOCYTESUR TRACE (A) 07/13/2018 0938    STUDIES: No results found.   ASSESSMENT: 79 y.o. [place] woman:  LEFT BREAST #1  S/P LEFT breast lumpectomy with re-excision on 11/29/95 for a T2 N1bi stage IIb invasive ductal carcinoma , grade 2, estrogen receptor 98% positive, progesterone receptor 97% positive, Ki67 8%.  #2 status post 4 cycles of doxorubicin and cyclophosphamide,    (i) followed by radiation therapy under the care of Dr. [name].  #3 received tamoxifen for a total of seven years   RIGHT BREAST #4  S/P biopsy of the RIGHT breast upper inner quadrant on 11/14/10 showing invasive ductal carcinoma,, grade 2, estrogen receptor 85% and progesterone receptor 57% positive, Ki67 20%, HER2 not amplified.    #5 started neoadjuvant  letrozole in November 2012; switched to tamoxifen as of February 2016 due to osteopenia concerns  #6  S/P right lumpectomy with sentinel lymph node biopsy on 09/03/11 for a ypT2, ypN1a, stage IIB invasive lobular carcinoma, grade 2,estrogen receptor 97% positive, progesterone receptor 12% positive, with no HER-2 amplification.  #7 status post right breast radiation therapy under the care of Dr. [name] from 10/19/2011 to 12/03/2011.   #8 did not meet criteria for genetic testing according to her insurance company.  #9 osteopenia, with a T score of -1.8 on DEXA scan at [facility] 03/07/2013  (a) status post multiple dental extractions and implants  (b) [facility] density [facility] 04/02/2015 shows a T score of -2.0  METASTATIC DISEASE: April 2021 #10:  left upper extremity lymphedema led to chest CT scan 05/09/2019 showing a 5.1  cm left subpectoral chest wall mass and thoracic lymphadenopathy   (a) CT biopsy of the chest wall mass 05/25/2019 confirms recurrent breast cancer, strongly estrogen and progesterone receptor positive, HER-2 not amplified  (b) PET scan 05/30/2019 shows a left subpectoral mass measuring 5.4 cm, with significant regional and mediastinal adenopathy, sclerotic left scapular metastasis, but no liver or lung involvement.  (c) CA 27-29 is informative  #11 anastrozole started 06/02/2019; palbociclib to be added 06/08/2019  #12 denosumab/Xgeva starting 06/08/2019, to be repeated every 28 days  #13 genetics testing pending   PLAN:   Stacey [name] is doing moderately well today.  Her WBC, hemoglobin, platelets, and ANC have all improved as compared to last week.  She is having less diarrhea, thought he diarrhea is still present.  A cdiff is pending.    Stacey [name] met with myself and Dr. Jana Hakim and reviewed her labs.  She will cotninue on her current calcium dosage along with anastrozole.  She will not restart Palbociclib at this time, we will wait to restart this.  She will not receive Xgeva  until 7/1 so long as her calcium level has improved.    I sent in cholestyramine for her diarrhea to help thicken up her stool.  She will take this TID and Dr. Jana Hakim reviewed with her how to take it.  I offered Kamelia to refer her to PT again, however she declined.  She is hopeful as she continues treatment, her tumor will respond, and then her lymphatic channels will be able to drain again.    We will see Stacey [name] back in 1  Week virtually.  She knows to call for any questions that may arise between now and her next appointment.  We are happy to see her sooner if needed.   Total encounter time today 30 minutes.Stacey Bihari, NP 07/12/19 10:09 AM Medical Oncology and Hematology [facility] [place], [place] [address] Tel. [phone]    Fax. [phone]   ADDENDUM: I think Stacey [name]'s diarrhea will be better controlled on Questran.  We wrote that prescription for her today and explained how to take it.  It could well be that this is all due to palbociclib but there are many competing possibilities so once all this is resolved we will give palbociclib a second try.  Meanwhile we are continuing the anastrozole which so far she is tolerating well.  We are holding denosumab/Xgeva for now.  Once it resumes given her hypo-Cal C. Mia we may wish to change to every 2 or even every 3 months on this medication  I personally saw this patient and performed a substantive portion of this encounter with the listed APP documented above.   Chauncey Cruel, MD Medical Oncology and Hematology [facility] [address] [place], [place] [address] Tel. [phone]    Fax. [phone]     *Total Encounter Time as defined by the Centers for Medicare and Medicaid Services includes, in addition to the face-to-face time of a patient visit (documented in the note above) non-face-to-face time: obtaining and reviewing outside history, ordering and reviewing  medications, tests or procedures, care coordination (communications with other health care professionals or caregivers) and documentation in the medical record.

## 2019-07-14 ENCOUNTER — Telehealth: Payer: Self-pay | Admitting: Oncology

## 2019-07-14 NOTE — Telephone Encounter (Signed)
Scheduled appt per 6/09 sch message -pt is aware.

## 2019-07-19 ENCOUNTER — Telehealth: Payer: Self-pay | Admitting: Oncology

## 2019-07-19 NOTE — Progress Notes (Signed)
ID: Stacey [name]   DOB: 08-31-1940  MR#: [id number]  HER#:[id number]  Patient Care Team: Biagio Borg, MD as PCP - General (Internal Medicine) Tyler Pita, MD as Consulting Physician (Radiation Oncology) Roseanne Kaufman, MD as Consulting Physician (Orthopedic Surgery) Lyndal Pulley, DO as Consulting Physician (Family Medicine) Aolanis Crispen, Virgie Dad, MD as Consulting Physician (Oncology) OTHER MD:  I connected with Stacey [name] on 07/20/19 at  2:30 PM EDT by telephone visit and verified that I am speaking with the correct person using two identifiers.   I discussed the limitations, risks, security and privacy concerns of performing an evaluation and management service by telemedicine and the availability of in-person appointments. I also discussed with the patient that there may be a patient responsible charge related to this service. The patient expressed understanding and agreed to proceed.   Other persons participating in the visit and their role in the encounter: none  Patient's location: home  Provider's location: [place]:   Stacey [name] returns today for follow up of her progressive left upper extremity lymphedema.  We tried to set up a video conference but her computer does not have a video option and she does not like using her phone for this.  She continues on anastrozole 06/02/2019.  She is tolerating it well so far.  She has had absolutely no side effects that she is aware of.  When she started palbociclib she had explosive diarrhea.  We stopped the medication and the problem has been slowly improving.  She tells me right now as she is still having some loose bowel movements which are however more formed.  She is taking the Questran with applesauce and is able to get it down that way.  She is trying to not use Imodium.  She received Xgeva on 06/08/2019 and takes one calcium supplement BID.  Since receiving the xgeva, her calcium was decreased last  week to 6.4.  She has since doubled her calcium, and her level has increased.     REVIEW OF SYSTEMS: Stacey [name] still feels tired and not up to snuff but she is slowly recovering.  She has had no fever, rash, bleeding, or other systemic symptoms.  A detailed review of systems today was otherwise stable.  HISTORY OF PRESENT ILLNESS:  From the original intake note:  " Stacey [name] is a 79 year old [place] woman, who has a history of breast cancer starting in 1997 and in 2013.    On 11/22/95, she underwent a left breast biopsy, which revealed malignant cells.  She had a left breast lumpectomy with re-excision on 11/29/95, with final pathology revealed IDC, grade 2, 2.5 cm, ER 98%, PR 97%, Ki67 8% with one of 15 nodes being positive.  She received chemotherapy, 4 cycles per patient, along with radiation and took Tamoxifen for 7 years following radiation completion.  Records from 1997 limited.    In October 2012, she had a biopsy of the right breast after mammography recommended additional images for a dense area in the right breast.  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  On 11/25/10, she had an MRI that measured the area in the right breast as 2.2 cm.  She started neoadjuvant [name] in November 2012 and has continued it since.  The area of concern measured 1.7 cm on a repeat MRI in  March of 2013.  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  Surgical margins were negative with one of one sentinel lymph node positive for metastatic carcinoma.  She then underwent radiation therapy under the care of Dr. [name] from 10/19/11 to 12/03/11. "   Her subsequent history is as detailed below   PAST MEDICAL HISTORY: Past Medical  History:  Diagnosis Date  . ANEMIA-NOS   . ANXIETY   . Breast cancer ([place]) 1997 L, 2012 R   s/p chemo/xrt  . COPD    resolved  . DIVERTICULOSIS, COLON 2008  . Dizziness   . Family history of breast cancer   . Family history of lung cancer   . Family history of lymphoma   . Family history of pancreatic cancer   . Family history of uterine cancer   . GERD   . Hx of radiation therapy 10/19/11 -12/03/11   right breast  . HYPERLIPIDEMIA   . IRRITABLE BOWEL SYNDROME, HX OF   . Left-sided carotid artery disease (H[facility])    moderate left ICA stenosis  . OSTEOARTHRITIS, HAND   . PSVT (paroxysmal supraventricular tachycardia) ([place])    symptomatic on event monitor    PAST SURGICAL HISTORY: Past Surgical History:  Procedure Laterality Date  . ABDOMINAL HYSTERECTOMY    . APPENDECTOMY    . BREAST BIOPSY  11/14/10    r breast: inv, insitu mammary carcinoma w/calcif, er/pr +, her2 -  . BREAST SURGERY     lumpectomy  . CATARACT EXTRACTION     both eyes  . ELECTROPHYSIOLOGIC STUDY N/A 05/10/2015   Procedure: SVT Ablation;  Surgeon: Will Meredith Leeds, MD;  Location: [facility];  Service: Cardiovascular;  Laterality: N/A;  . HERNIA REPAIR    . inguinal herniorrhapy  1984   left  . IR US GUIDE BX ASP/DRAIN  05/26/2019  . rectal fissure repair    . s/p benign breast biopsy  2003   right  . s/p left foot surgury  2009  . 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 spiral fx left foot  2008   no surgury  . TMJ ARTHROPLASTY  1989  . [facility]- to remove scar tissue growth   . TONSILLECTOMY      FAMILY HISTORY Family History  Problem Relation Age of Onset  . Hypertension Mother   . Stroke Mother   . Colon polyps Mother   . Diabetes Mother   . Pancreatic cancer Mother 56  . Uterine cancer Mother 56  . Lymphoma Brother        burkitts  . Lung cancer Paternal Uncle   . Lung cancer Maternal Grandmother 33       non-smoker  . Lung cancer Maternal Grandfather    . Breast cancer Cousin        maternal cousin, dx in her mid 28s  . Brain cancer Cousin        maternal cousin's son; dx in his 63s  . Testicular cancer Cousin        maternal cousin's son;   . Breast cancer Cousin        paternal cousin; dx in her 40s  . Breast cancer Cousin        paternal cousin's daughter; dx in 15s; neg genetic testing  . Colon  cancer Neg Hx   . Esophageal cancer Neg Hx   . Stomach cancer Neg Hx   . Rectal cancer Neg Hx     GYNECOLOGIC HISTORY:   Menarche age 68, GX P0 (one miscarriage at 5 months). Status post vaginal hysterectomy in the late 1970s, no salpingo-oophorectomy    SOCIAL HISTORY:   The patient lives alone and is divorced. She retired from [employer], were she worked in Physiological scientist.Marland Kitchen     ADVANCED DIRECTIVES:  Living will in place; the patient's healthcare power of attorney is her stepdaughter, Vladimir Faster, who can be reached at [place]: Social History   Tobacco Use  . Smoking status: Former Smoker    Packs/day: 1.00    Years: 54.00    Pack years: 54.00    Types: Cigarettes    Quit date: 01/03/2016    Years since quitting: 3.5  . Smokeless tobacco: Never Used  Vaping Use  . Vaping Use: Never used  Substance Use Topics  . Alcohol use: Yes    Comment: rare/ drinks socially  . Drug use: No    Allergies  Allergen Reactions  . Bee Venom Swelling  . Clindamycin/Lincomycin Diarrhea and Nausea And Vomiting  . Codeine Nausea And Vomiting    Current Outpatient Medications  Medication Sig Dispense Refill  . anastrozole (ARIMIDEX) 1 MG tablet Take 1 tablet (1 mg total) by mouth daily. 90 tablet 4  . aspirin 81 MG EC tablet Take 81 mg by mouth daily.      . calcium-vitamin D (OSCAL WITH D) 500-200 MG-UNIT per tablet Take 1 tablet by mouth daily.    . cholecalciferol (VITAMIN D) 1000 UNITS tablet Take 1,000 Units by mouth daily.      . cholestyramine (QUESTRAN) 4 g packet Take 1 packet (4 g total) by mouth 3  (three) times daily. 60 each 12  . clotrimazole-betamethasone (LOTRISONE) cream Apply 1 application topically 2 (two) times daily as needed. 30 g 1  . diclofenac sodium (VOLTAREN) 1 % GEL Apply 4 g topically 4 (four) times daily as needed. 400 g 11  . diphenhydrAMINE (BENADRYL) 25 MG tablet Take 25 mg by mouth every 6 (six) hours as needed (for bee stings).     . fluticasone (FLONASE) 50 MCG/ACT nasal spray Place 2 sprays into both nostrils daily.    Marland Kitchen loratadine (CLARITIN) 10 MG tablet Take 10 mg by mouth daily.    . meloxicam (MOBIC) 15 MG tablet TAKE 1 TABLET(15 MG) BY MOUTH DAILY Annual appt due in June must see provider for future refills 90 tablet 0  . Multiple Vitamin (MULTIVITAMIN) capsule Take 1 capsule by mouth daily.      Marland Kitchen omeprazole (PRILOSEC) 20 MG capsule TAKE 1 CAPSULE(20 MG) BY MOUTH DAILY 90 capsule 3  . palbociclib (IBRANCE) 125 MG tablet Take 1 tablet (125 mg total) by mouth daily. Take for 21 days on, 7 days off, repeat every 28 days. 21 tablet 0  . polyethylene glycol (MIRALAX / GLYCOLAX) packet take 1 packet once daily if needed for constipation 30 packet 11  . prochlorperazine (COMPAZINE) 10 MG tablet Take 1 tablet (10 mg total) by mouth every 6 (six) hours as needed for nausea or vomiting. 30 tablet 1  . pseudoephedrine (SUDAFED) 30 MG tablet Take 30 mg by mouth every 4 (four) hours as needed for congestion.     Marland Kitchen telmisartan (MICARDIS) 40 MG tablet Take 1 tablet (40 mg total) by mouth daily. 90 tablet 3  .  Tiotropium Bromide Monohydrate (SPIRIVA RESPIMAT) 2.5 MCG/ACT AERS Inhale 2 puffs into the lungs daily. 4 g 11  . zolpidem (AMBIEN) 10 MG tablet TAKE 1 TABLET(10 MG) BY MOUTH AT BEDTIME AS NEEDED 90 tablet 1   No current facility-administered medications for this visit.    OBJECTIVE: There were no vitals filed for this visit.   There is no height or weight on file to calculate BMI.     Televisit 07/20/2019    LAB RESULTS: Lab Results  Component Value Date    WBC 3.4 (L) 07/12/2019   NEUTROABS 1.9 07/12/2019   HGB 11.2 (L) 07/12/2019   HCT 33.8 (L) 07/12/2019   MCV 88.5 07/12/2019   PLT 282 07/12/2019      Chemistry      Component Value Date/Time   NA 145 07/12/2019 0934   NA 140 12/09/2015 1409   K 3.6 07/12/2019 0934   K 4.2 12/09/2015 1409   CL 107 07/12/2019 0934   CL 107 02/02/2012 0939   CO2 23 07/12/2019 0934   CO2 25 12/09/2015 1409   BUN 13 07/12/2019 0934   BUN 16.7 12/09/2015 1409   CREATININE 0.90 07/12/2019 0934   CREATININE 1.02 (H) 05/09/2019 1303   CREATININE 0.8 12/09/2015 1409      Component Value Date/Time   CALCIUM 7.8 (L) 07/12/2019 0934   CALCIUM 9.4 12/09/2015 1409   ALKPHOS 56 07/12/2019 0934   ALKPHOS 54 12/09/2015 1409   AST 23 07/12/2019 0934   AST 23 05/09/2019 1303   AST 20 12/09/2015 1409   ALT 16 07/12/2019 0934   ALT 13 05/09/2019 1303   ALT 13 12/09/2015 1409   BILITOT 0.3 07/12/2019 0934   BILITOT 0.4 05/09/2019 1303   BILITOT <0.22 12/09/2015 1409       Lab Results  Component Value Date   LABCA2 56 (H) 12/10/2010    No components found for: [id number]  No results for input(s): INR in the last 168 hours.  Urinalysis    Component Value Date/Time   COLORURINE YELLOW 07/13/2018 0938   APPEARANCEUR Sl Cloud[place] (A) 07/13/2018 0938   LABSPEC 1.020 07/13/2018 0938   PHURINE 6.0 07/13/2018 0938   GLUCOSEU NEGATIVE 07/13/2018 0938   HGBUR NEGATIVE 07/13/2018 0938   BILIRUBINUR NEGATIVE 07/13/2018 0938   KETONESUR NEGATIVE 07/13/2018 0938   UROBILINOGEN 0.2 07/13/2018 0938   NITRITE NEGATIVE 07/13/2018 0938   LEUKOCYTESUR TRACE (A) 07/13/2018 0938    STUDIES: No results found.    ASSESSMENT: 79 y.o. [place] woman:  LEFT BREAST #1  S/P LEFT breast lumpectomy with re-excision on 11/29/95 for a T2 N1bi stage IIb invasive ductal carcinoma , grade 2, estrogen receptor 98% positive, progesterone receptor 97% positive, Ki67 8%.  #2 status post 4 cycles of doxorubicin and  cyclophosphamide,    (i) followed by radiation therapy under the care of Dr. [name].  #3 received tamoxifen for a total of seven years   RIGHT BREAST #4  S/P biopsy of the RIGHT breast upper inner quadrant on 11/14/10 showing invasive ductal carcinoma,, grade 2, estrogen receptor 85% and progesterone receptor 57% positive, Ki67 20%, HER2 not amplified.    #5 started neoadjuvant letrozole in November 2012; switched to tamoxifen as of February 2016 due to osteopenia concerns  #6  S/P right lumpectomy with sentinel lymph node biopsy on 09/03/11 for a ypT2, ypN1a, stage IIB invasive lobular carcinoma, grade 2,estrogen receptor 97% positive, progesterone receptor 12% positive, with no HER-2 amplification.  #7 status post right breast  radiation therapy under the care of Dr. [name] from 10/19/2011 to 12/03/2011.   #8 did not meet criteria for genetic testing according to her insurance company.  #9 osteopenia, with a T score of -1.8 on DEXA scan at [facility] 03/07/2013  (a) status post multiple dental extractions and implants  (b) repeat bone density at [facility] 04/02/2015 shows a T score of -2.0  METASTATIC DISEASE: April 2021 #10:  left upper extremity lymphedema led to chest CT scan 05/09/2019 showing a 5.1 cm left subpectoral chest wall mass and thoracic lymphadenopathy   (a) CT biopsy of the chest wall mass 05/25/2019 confirms recurrent breast cancer, strongly estrogen and progesterone receptor positive, HER-2 not amplified  (b) PET scan 05/30/2019 shows a left subpectoral mass measuring 5.4 cm, with significant regional and mediastinal adenopathy, sclerotic left scapular metastasis, but no liver or lung involvement.  (c) CA 27-29 is informative  #11 anastrozole started 06/02/2019  (a) palbociclib added 06/08/2019, held after 1 week with explosive diarrhea  #12 denosumab/Xgeva starting 06/08/2019, to be repeated every 28 days  #13 genetics testing  (a) Negative genetic testing. No pathogenic  variants identified on the [facility] Common Hereditary Cancers Panel. The report date is 06/15/2019.  The Common Hereditary Cancers Panel offered by [facility] includes sequencing and/or deletion duplication testing of the following 48 genes: APC, ATM, AXIN2, BARD1, BMPR1A, BRCA1, BRCA2, BRIP1, CDH1, CDKN2A (p14ARF), CDKN2A (p16INK4a), CKD4, CHEK2, CTNNA1, DICER1, EPCAM (Deletion/duplication testing only), GREM1 (promoter region deletion/duplication testing only), KIT, MEN1, MLH1, MSH2, MSH3, MSH6, MUTYH, NBN, NF1, NHTL1, PALB2, PDGFRA, PMS2, POLD1, POLE, PTEN, RAD50, RAD51C, RAD51D, RNF43, SDHB, SDHC, SDHD, SMAD4, SMARCA4. STK11, TP53, TSC1, TSC2, and VHL.  The following genes were evaluated for sequence changes only: SDHA and HOXB13 c.251G>A variant only.   PLAN:   Stacey [name]'s diarrhea is improving.  Note that we obtain a C. difficile test on 07/12/2019 and it was negative.  We do believe the palbociclib caused the problem and the question is how to proceed from here.  I would like her to completely recover from the diarrhea.  She is already scheduled to see [place] on 08/02/2019. If everything is well going well at that time we will start her on 1 tablet of 125 mg palbociclib the following Monday, July 5.  If she tolerates that well the following week she would receive palbociclib July 12 and July 15 and if she tolerates that well we will go to Monday Wednesday and Friday.  She would then stay on Monday Wednesday and Friday palbociclib for 3 weeks.  She would then be off a week and I would see her at that time and a follow-up visit.  If the diarrhea recurs with any of these doses then we will probably not be able to use the palbociclib and we will have to think of a different strategy for her  I encouraged her to continue to hydrate herself well.  She knows to call for any other problems that may develop before the next visit    Stacey [name] C. Stacey Betley, MD 07/20/19 5:06 PM Medical Oncology and Hematology [facility] [place], [place] [address] Tel. [phone]    Fax. [phone]   I, Wilburn Mylar, am acting as scribe for Dr. Virgie Dad. Ulisses Vondrak.  I, Lurline Del MD, have reviewed the above documentation for accuracy and completeness, and I agree with the above.    *Total Encounter Time as defined by the Centers for Medicare and Medicaid Services includes, in addition  to the face-to-face time of a patient visit (documented in the note above) non-face-to-face time: obtaining and reviewing outside history, ordering and reviewing medications, tests or procedures, care coordination (communications with other health care professionals or caregivers) and documentation in the medical record.

## 2019-07-20 ENCOUNTER — Inpatient Hospital Stay (HOSPITAL_BASED_OUTPATIENT_CLINIC_OR_DEPARTMENT_OTHER): Payer: Medicare Other | Admitting: Oncology

## 2019-07-20 DIAGNOSIS — Z170 Estrogen receptor positive status [ER+]: Secondary | ICD-10-CM | POA: Diagnosis not present

## 2019-07-20 DIAGNOSIS — C50212 Malignant neoplasm of upper-inner quadrant of left female breast: Secondary | ICD-10-CM

## 2019-07-20 DIAGNOSIS — C7989 Secondary malignant neoplasm of other specified sites: Secondary | ICD-10-CM | POA: Diagnosis not present

## 2019-07-21 ENCOUNTER — Telehealth: Payer: Self-pay | Admitting: Oncology

## 2019-07-21 NOTE — Telephone Encounter (Signed)
No 6/17 los. No changes made to pt's scheudle.

## 2019-07-22 ENCOUNTER — Other Ambulatory Visit: Payer: Self-pay | Admitting: Internal Medicine

## 2019-08-02 ENCOUNTER — Inpatient Hospital Stay (HOSPITAL_BASED_OUTPATIENT_CLINIC_OR_DEPARTMENT_OTHER): Payer: Medicare Other | Admitting: Adult Health

## 2019-08-02 ENCOUNTER — Encounter: Payer: Self-pay | Admitting: Adult Health

## 2019-08-02 ENCOUNTER — Inpatient Hospital Stay: Payer: Medicare Other

## 2019-08-02 ENCOUNTER — Other Ambulatory Visit: Payer: Self-pay

## 2019-08-02 VITALS — BP 149/65 | HR 88 | Temp 98.4°F | Resp 20 | Ht 63.75 in | Wt 162.2 lb

## 2019-08-02 DIAGNOSIS — C50212 Malignant neoplasm of upper-inner quadrant of left female breast: Secondary | ICD-10-CM | POA: Diagnosis not present

## 2019-08-02 DIAGNOSIS — Z170 Estrogen receptor positive status [ER+]: Secondary | ICD-10-CM

## 2019-08-02 DIAGNOSIS — C50912 Malignant neoplasm of unspecified site of left female breast: Secondary | ICD-10-CM | POA: Diagnosis not present

## 2019-08-02 DIAGNOSIS — C7989 Secondary malignant neoplasm of other specified sites: Secondary | ICD-10-CM | POA: Diagnosis not present

## 2019-08-02 DIAGNOSIS — C7951 Secondary malignant neoplasm of bone: Secondary | ICD-10-CM

## 2019-08-02 DIAGNOSIS — C50911 Malignant neoplasm of unspecified site of right female breast: Secondary | ICD-10-CM

## 2019-08-02 DIAGNOSIS — C50211 Malignant neoplasm of upper-inner quadrant of right female breast: Secondary | ICD-10-CM

## 2019-08-02 DIAGNOSIS — I890 Lymphedema, not elsewhere classified: Secondary | ICD-10-CM

## 2019-08-02 DIAGNOSIS — R9389 Abnormal findings on diagnostic imaging of other specified body structures: Secondary | ICD-10-CM

## 2019-08-02 LAB — COMPREHENSIVE METABOLIC PANEL
ALT: 16 U/L (ref 0–44)
AST: 21 U/L (ref 15–41)
Albumin: 3.6 g/dL (ref 3.5–5.0)
Alkaline Phosphatase: 73 U/L (ref 38–126)
Anion gap: 10 (ref 5–15)
BUN: 17 mg/dL (ref 8–23)
CO2: 20 mmol/L — ABNORMAL LOW (ref 22–32)
Calcium: 9.5 mg/dL (ref 8.9–10.3)
Chloride: 109 mmol/L (ref 98–111)
Creatinine, Ser: 0.93 mg/dL (ref 0.44–1.00)
GFR calc Af Amer: >60 mL/min (ref >60)
GFR calc non Af Amer: 58 mL/min — ABNORMAL LOW (ref >60)
Glucose, Bld: 109 mg/dL — ABNORMAL HIGH (ref 70–99)
Potassium: 4 mmol/L (ref 3.5–5.1)
Sodium: 139 mmol/L (ref 135–145)
Total Bilirubin: 0.3 mg/dL (ref 0.3–1.2)
Total Protein: 7.3 g/dL (ref 6.5–8.1)

## 2019-08-02 LAB — CBC WITH DIFFERENTIAL/PLATELET
Abs Immature Granulocytes: 0.1 10*3/uL — ABNORMAL HIGH (ref 0.00–0.07)
Basophils Absolute: 0.1 10*3/uL (ref 0.0–0.1)
Basophils Relative: 1 %
Eosinophils Absolute: 0.6 10*3/uL — ABNORMAL HIGH (ref 0.0–0.5)
Eosinophils Relative: 7 %
HCT: 35.2 % — ABNORMAL LOW (ref 36.0–46.0)
Hemoglobin: 11.6 g/dL — ABNORMAL LOW (ref 12.0–15.0)
Immature Granulocytes: 1 %
Lymphocytes Relative: 12 %
Lymphs Abs: 1 10*3/uL (ref 0.7–4.0)
MCH: 29.7 pg (ref 26.0–34.0)
MCHC: 33 g/dL (ref 30.0–36.0)
MCV: 90 fL (ref 80.0–100.0)
Monocytes Absolute: 0.9 10*3/uL (ref 0.1–1.0)
Monocytes Relative: 11 %
Neutro Abs: 5.5 10*3/uL (ref 1.7–7.7)
Neutrophils Relative %: 68 %
Platelets: 313 10*3/uL (ref 150–400)
RBC: 3.91 MIL/uL (ref 3.87–5.11)
RDW: 17.7 % — ABNORMAL HIGH (ref 11.5–15.5)
WBC: 8.1 10*3/uL (ref 4.0–10.5)
nRBC: 0 % (ref 0.0–0.2)

## 2019-08-02 NOTE — Progress Notes (Addendum)
ID: Stacey [name]   DOB: February 11, 1940  MR#: [id number]  PNT#:[id number]  Patient Care Team: Biagio Borg, MD as PCP - General (Internal Medicine) Tyler Pita, MD as Consulting Physician (Radiation Oncology) Roseanne Kaufman, MD as Consulting Physician (Orthopedic Surgery) Lyndal Pulley, DO as Consulting Physician (Family Medicine) Magrinat, Virgie Dad, MD as Consulting Physician (Oncology) OTHER MD:   INTERVAL HISTORY:   Stacey [name] returns today for follow up of her progressive left upper extremity lymphedema.   She started her anastrozole 06/02/2019.  She is tolerating it well so far.  She has had absolutely no side effects.  Stacey [name] was previously on Palbociclib at 150m daily.  She developed significant diarrhea, and dehydration.  C diff was negative.  This really caused a lot of issues for her.  Her Palbociclib has been held for a few weeks now.  She did meet with Dr. MJana Hakim2 weeks ago to discuss a restart plan.    REVIEW OF SYSTEMS: GJackquelinenotes that her bowels are much improved.  She is taking Cholestyramine BID and is having about 2 bowel movements per day. Her energy is slowly improving, and she has an improved appetite.  She denies any recent fever, chills, chest pain, palpitation, cough, shortness of breath, bowel/bladder changes, headaches, vision issues, mucositis, or any other concerns.    HISTORY OF PRESENT ILLNESS:  From the original intake note:  " Stacey Mccalisteris a 79year old [name], who has a history of breast cancer starting in 1997 and in 2013.    On 11/22/95, she underwent a left breast biopsy, which revealed malignant cells.  She had a left breast lumpectomy with re-excision on 11/29/95, with final pathology revealed IDC, grade 2, 2.5 cm, ER 98%, PR 97%, Ki67 8% with one of 15 nodes being positive.  She received chemotherapy, 4 cycles per patient, along with radiation and took Tamoxifen for 7 years following radiation completion.  Records from 1997 limited.    In  October 2012, she had a biopsy of the right breast after mammography recommended additional images for a dense area in the right breast.  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  On 11/25/10, she had an MRI that measured the area in the right breast as 2.2 cm.  She started neoadjuvant [name] in November 2012 and has continued it since.  The area of concern measured 1.7 cm on a repeat MRI in March of 2013.  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  Surgical margins were negative with one of one sentinel lymph node positive for metastatic carcinoma.  She then underwent radiation therapy under the care of Dr. MTammi Klippelfrom 10/19/11 to 12/03/11. "   Her subsequent history is as detailed below   PAST MEDICAL HISTORY: Past Medical History:  Diagnosis Date  . ANEMIA-NOS   . ANXIETY   . Breast cancer [place] L, 2012 R   s/p chemo/xrt  . COPD    resolved  . DIVERTICULOSIS, COLON 2008  . Dizziness   . Family history of breast cancer   . Family history of lung cancer   . Family history of lymphoma   . Family history of pancreatic cancer   . Family history of uterine cancer   .  GERD   . Hx of radiation therapy 10/19/11 -12/03/11   right breast  . HYPERLIPIDEMIA   . IRRITABLE BOWEL SYNDROME, HX OF   . Left-sided carotid artery disease (HCC)    moderate left ICA stenosis  . OSTEOARTHRITIS, HAND   . PSVT (paroxysmal supraventricular tachycardia) ([place])    symptomatic on event monitor    PAST SURGICAL HISTORY: Past Surgical History:  Procedure Laterality Date  . ABDOMINAL HYSTERECTOMY    . APPENDECTOMY    . BREAST BIOPSY  11/14/10    r breast: inv, insitu mammary carcinoma w/calcif, er/pr +, her2 -  .  BREAST SURGERY     lumpectomy  . CATARACT EXTRACTION     both eyes  . ELECTROPHYSIOLOGIC STUDY N/A 05/10/2015   Procedure: SVT Ablation;  Surgeon: Will Meredith Leeds, MD;  Location: [facility];  Service: Cardiovascular;  Laterality: N/A;  . HERNIA REPAIR    . inguinal herniorrhapy  1984   left  . IR US GUIDE BX ASP/DRAIN  05/26/2019  . rectal fissure repair    . s/p benign breast biopsy  2003   right  . s/p left foot surgury  2009  . 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 spiral fx left foot  2008   no surgury  . TMJ ARTHROPLASTY  1989  . [place]- to remove scar tissue growth   . TONSILLECTOMY      FAMILY HISTORY Family History  Problem Relation Age of Onset  . Hypertension Mother   . Stroke Mother   . Colon polyps Mother   . Diabetes Mother   . Pancreatic cancer Mother 73  . Uterine cancer Mother 22  . Lymphoma Brother        burkitts  . Lung cancer Paternal Uncle   . Lung cancer Maternal Grandmother 51       non-smoker  . Lung cancer Maternal Grandfather   . Breast cancer Cousin        maternal cousin, dx in her mid 27s  . Brain cancer Cousin        maternal cousin's son; dx in his 71s  . Testicular cancer Cousin        maternal cousin's son;   . Breast cancer Cousin        paternal cousin; dx in her 27s  . Breast cancer Cousin        paternal cousin's daughter; dx in 74s; neg genetic testing  . Colon cancer Neg Hx   . Esophageal cancer Neg Hx   . Stomach cancer Neg Hx   . Rectal cancer Neg Hx     GYNECOLOGIC HISTORY:   Menarche age 70, GX P0 (one miscarriage at 5 months). Status post vaginal hysterectomy in the late 1970s, no salpingo-oophorectomy    SOCIAL HISTORY:   The patient lives alone and is divorced. She retired from [employer], were she worked in Physiological scientist.Marland Kitchen     ADVANCED DIRECTIVES:  Living will in place; the patient's healthcare power of attorney is her stepdaughter, Vladimir Faster, who can be reached at  [place]: Social History   Tobacco Use  . Smoking status: Former Smoker    Packs/day: 1.00    Years: 54.00    Pack years: 54.00    Types: Cigarettes    Quit date: 01/03/2016    Years since quitting: 3.5  . Smokeless tobacco: Never Used  Vaping Use  .  Vaping Use: Never used  Substance Use Topics  . Alcohol use: Yes    Comment: rare/ drinks socially  . Drug use: No    Allergies  Allergen Reactions  . Bee Venom Swelling  . Clindamycin/Lincomycin Diarrhea and Nausea And Vomiting  . Codeine Nausea And Vomiting    Current Outpatient Medications  Medication Sig Dispense Refill  . anastrozole (ARIMIDEX) 1 MG tablet Take 1 tablet (1 mg total) by mouth daily. 90 tablet 4  . aspirin 81 MG EC tablet Take 81 mg by mouth daily.      . calcium-vitamin D (OSCAL WITH D) 500-200 MG-UNIT per tablet Take 1 tablet by mouth daily.    . cholecalciferol (VITAMIN D) 1000 UNITS tablet Take 1,000 Units by mouth daily.      . cholestyramine (QUESTRAN) 4 g packet Take 1 packet (4 g total) by mouth 3 (three) times daily. 60 each 12  . clotrimazole-betamethasone (LOTRISONE) cream Apply 1 application topically 2 (two) times daily as needed. 30 g 1  . diclofenac sodium (VOLTAREN) 1 % GEL Apply 4 g topically 4 (four) times daily as needed. 400 g 11  . diphenhydrAMINE (BENADRYL) 25 MG tablet Take 25 mg by mouth every 6 (six) hours as needed (for bee stings).     . fluticasone (FLONASE) 50 MCG/ACT nasal spray Place 2 sprays into both nostrils daily.    Marland Kitchen loratadine (CLARITIN) 10 MG tablet Take 10 mg by mouth daily.    . meloxicam (MOBIC) 15 MG tablet TAKE 1 TABLET BY MOUTH DAILY 90 tablet 1  . Multiple Vitamin (MULTIVITAMIN) capsule Take 1 capsule by mouth daily.      Marland Kitchen omeprazole (PRILOSEC) 20 MG capsule TAKE 1 CAPSULE(20 MG) BY MOUTH DAILY 90 capsule 3  . palbociclib (IBRANCE) 125 MG tablet Take 1 tablet (125 mg total) by mouth daily. Take for 21 days on, 7 days off, repeat every  28 days. 21 tablet 0  . polyethylene glycol (MIRALAX / GLYCOLAX) packet take 1 packet once daily if needed for constipation 30 packet 11  . prochlorperazine (COMPAZINE) 10 MG tablet Take 1 tablet (10 mg total) by mouth every 6 (six) hours as needed for nausea or vomiting. 30 tablet 1  . pseudoephedrine (SUDAFED) 30 MG tablet Take 30 mg by mouth every 4 (four) hours as needed for congestion.     Marland Kitchen telmisartan (MICARDIS) 40 MG tablet Take 1 tablet (40 mg total) by mouth daily. 90 tablet 3  . Tiotropium Bromide Monohydrate (SPIRIVA RESPIMAT) 2.5 MCG/ACT AERS Inhale 2 puffs into the lungs daily. 4 g 11  . zolpidem (AMBIEN) 10 MG tablet TAKE 1 TABLET(10 MG) BY MOUTH AT BEDTIME AS NEEDED 90 tablet 1   No current facility-administered medications for this visit.    OBJECTIVE: Vitals:   08/02/19 1317  BP: (!) 149/65  Pulse: 88  Resp: 20  Temp: 98.4 F (36.9 C)  SpO2: 97%     Body mass index is 28.06 kg/m.    GENERAL: Patient is a well appearing female in no acute distress HEENT:  Sclerae anicteric. Mask in place. Neck is supple.  NODES:  No cervical, supraclavicular, or axillary lymphadenopathy palpated.  BREAST EXAM:  Deferred. LUNGS:  Clear to auscultation bilaterally.  No wheezes or rhonchi. HEART:  Regular rate and rhythm. No murmur appreciated. ABDOMEN:  Soft, nontender.  Positive, normoactive bowel sounds. No organomegaly palpated. MSK:  No focal spinal tenderness to palpation.  EXTREMITIES: left arm lymphedema SKIN:  Clear with no obvious  rashes or skin changes. No nail dyscrasia. NEURO:  Nonfocal. Well oriented.  Appropriate affect.     LAB RESULTS: Lab Results  Component Value Date   WBC 8.1 08/02/2019   NEUTROABS 5.5 08/02/2019   HGB 11.6 (L) 08/02/2019   HCT 35.2 (L) 08/02/2019   MCV 90.0 08/02/2019   PLT 313 08/02/2019      Chemistry      Component Value Date/Time   NA 139 08/02/2019 1230   NA 140 12/09/2015 1409   K 4.0 08/02/2019 1230   K 4.2 12/09/2015  1409   CL 109 08/02/2019 1230   CL 107 02/02/2012 0939   CO2 20 (L) 08/02/2019 1230   CO2 25 12/09/2015 1409   BUN 17 08/02/2019 1230   BUN 16.7 12/09/2015 1409   CREATININE 0.93 08/02/2019 1230   CREATININE 1.02 (H) 05/09/2019 1303   CREATININE 0.8 12/09/2015 1409      Component Value Date/Time   CALCIUM 9.5 08/02/2019 1230   CALCIUM 9.4 12/09/2015 1409   ALKPHOS 73 08/02/2019 1230   ALKPHOS 54 12/09/2015 1409   AST 21 08/02/2019 1230   AST 23 05/09/2019 1303   AST 20 12/09/2015 1409   ALT 16 08/02/2019 1230   ALT 13 05/09/2019 1303   ALT 13 12/09/2015 1409   BILITOT 0.3 08/02/2019 1230   BILITOT 0.4 05/09/2019 1303   BILITOT <0.22 12/09/2015 1409       Lab Results  Component Value Date   LABCA2 56 (H) 12/10/2010    No components found for: [id number]  No results for input(s): INR in the last 168 hours.  Urinalysis    Component Value Date/Time   COLORURINE YELLOW 07/13/2018 0938   APPEARANCEUR Sl Cloud[place] (A) 07/13/2018 0938   LABSPEC 1.020 07/13/2018 0938   PHURINE 6.0 07/13/2018 0938   GLUCOSEU NEGATIVE 07/13/2018 0938   HGBUR NEGATIVE 07/13/2018 0938   BILIRUBINUR NEGATIVE 07/13/2018 0938   KETONESUR NEGATIVE 07/13/2018 0938   UROBILINOGEN 0.2 07/13/2018 0938   NITRITE NEGATIVE 07/13/2018 0938   LEUKOCYTESUR TRACE (A) 07/13/2018 0938    STUDIES: No results found.   ASSESSMENT: 79 y.o. [place] woman:  LEFT BREAST #1  S/P LEFT breast lumpectomy with re-excision on 11/29/95 for a T2 N1bi stage IIb invasive ductal carcinoma , grade 2, estrogen receptor 98% positive, progesterone receptor 97% positive, Ki67 8%.  #2 status post 4 cycles of doxorubicin and cyclophosphamide,    (i) followed by radiation therapy under the care of Dr. [name].  #3 received tamoxifen for a total of seven years   RIGHT BREAST #4  S/P biopsy of the RIGHT breast upper inner quadrant on 11/14/10 showing invasive ductal carcinoma,, grade 2, estrogen receptor 85% and  progesterone receptor 57% positive, Ki67 20%, HER2 not amplified.    #5 started neoadjuvant letrozole in November 2012; switched to tamoxifen as of February 2016 due to osteopenia concerns  #6  S/P right lumpectomy with sentinel lymph node biopsy on 09/03/11 for a ypT2, ypN1a, stage IIB invasive lobular carcinoma, grade 2,estrogen receptor 97% positive, progesterone receptor 12% positive, with no HER-2 amplification.  #7 status post right breast radiation therapy under the care of Dr. [name] from 10/19/2011 to 12/03/2011.   #8 did not meet criteria for genetic testing according to her insurance company.  #9 osteopenia, with a T score of -1.8 on DEXA scan at [facility] 03/07/2013  (a) status post multiple dental extractions and implants  (b) repeat bone density at [facility] 04/02/2015 shows a T score of -  2.0  METASTATIC DISEASE: April 2021 #10:  left upper extremity lymphedema led to chest CT scan 05/09/2019 showing a 5.1 cm left subpectoral chest wall mass and thoracic lymphadenopathy   (a) CT biopsy of the chest wall mass 05/25/2019 confirms recurrent breast cancer, strongly estrogen and progesterone receptor positive, HER-2 not amplified  (b) PET scan 05/30/2019 shows a left subpectoral mass measuring 5.4 cm, with significant regional and mediastinal adenopathy, sclerotic left scapular metastasis, but no liver or lung involvement.  (c) CA 27-29 is informative  #11 anastrozole started 06/02/2019; palbociclib to be added 06/08/2019  #12 denosumab/Xgeva starting 06/08/2019, to be repeated every 3 months due to hypocalcemia  #13 genetics testing pending   PLAN:   Stacey [name] is doing much better today than when we last saw her.  Her bowels are much improved and she continues on Anastrozole with good tolerance.  She will continue this.  She is approaching the time to restart the Palbociclib.  She will do this on Monday July 5, and will take 1 tablet.  The following week she will take one tablet of her  Palbociclib on July 12 and July 14.  The following week she will take one tablet on July 19, 21, 23, and will repeat this x 3 weeks, so long as she is tolerating it well.  She will then see Dr. Jana Hakim the following week for labs and f/u.  I wrote this plan down for her and she understands it.  Her bowels have normalized and she will continue taking Cholestyramine BID.  I recommended continued fluid intake and for her to keep her normal activity level.  After her Ricarda Frame, she developed significant hypocalcemia.  She is taking 2 calcium tablets daily, and her calcium is normal today.  She will receive the Xgeva in 09/2019.    We will see Stacey [name] back the second week of August for labs and f/u.  She knows to call for any questions that may arise between now and her next appointment.  We are happy to see her sooner if needed.  Total encounter time today 30 minutes.Wilber Bihari, NP 08/02/19 1:24 PM Medical Oncology and Hematology [facility] [place], [place] [address] Tel. [phone]    Fax. [phone]  *Total Encounter Time as defined by the Centers for Medicare and Medicaid Services includes, in addition to the face-to-face time of a patient visit (documented in the note above) non-face-to-face time: obtaining and reviewing outside history, ordering and reviewing medications, tests or procedures, care coordination (communications with other health care professionals or caregivers) and documentation in the medical record.

## 2019-08-03 ENCOUNTER — Other Ambulatory Visit: Payer: Medicare Other

## 2019-08-03 ENCOUNTER — Ambulatory Visit: Payer: Medicare Other

## 2019-08-03 ENCOUNTER — Telehealth: Payer: Self-pay

## 2019-08-03 LAB — CANCER ANTIGEN 27.29: CA 27.29: 66.5 U/mL — ABNORMAL HIGH (ref 0.0–38.6)

## 2019-08-03 NOTE — Telephone Encounter (Signed)
I called the patient today about their upcoming follow-up appointment in radiation oncology.   Given the state of the  COVID-19 pandemic, concerning case numbers in our community, and guidance from [facility], I offered a phone assessment with the patient to determine if coming to the clinic was necessary. The patient accepted.  I let the patient know that I had spoken with Dr. Isidore Moos, and she wanted them to know the importance of washing their hands for at least 20 seconds at a time, especially after going out in public, and before they eat. Limit going out in public whenever possible. Do not touch your face, unless your hands are clean, such as when bathing. Get plenty of rest, eat well, and stay hydrated. Patient verbalized understanding and agreement.  Symptomatically, the patient is doing relatively well. They report left shoulder pain has resolved, and deny any skin changes/cocnerns to treatment field. She still reports fatigue but she is not sure if that is residual from radiation or her Ibrance medication.  All questions were answered to the patient's satisfaction.  I encouraged the patient to call with any further questions. Otherwise, the plan is to continue follow up with medical oncology team, and reach out to radiation oncology department as needed.    Patient is pleased with this plan, and we will cancel their upcoming follow-up to reduce the risk of COVID-19 transmission.

## 2019-08-04 ENCOUNTER — Ambulatory Visit: Payer: Medicare Other | Admitting: Radiation Oncology

## 2019-08-20 NOTE — Progress Notes (Signed)
  Patient Name: Stacey [name] MRN: [id number] DOB: 27-Apr-1940 Referring Physician: Cathlean Cower (Profile Not Attached) Date of Service: 06/30/2019 [facility] [place]                                                        End Of Treatment Note  Diagnoses: C79.51-Secondary malignant neoplasm of bone  Cancer Staging: Cancer Staging Malignant neoplasm of upper-inner quadrant of left breast in female, estrogen receptor positive ([place]) Staging form: Breast, AJCC 7th Edition - Clinical: Stage IIB (T2, N1, M0) - Signed by Chauncey Cruel, MD on 12/19/2014  Intent: Palliative  Radiation Treatment Dates: 06/14/2019 through 06/30/2019 Site Technique Total Dose (Gy) Dose per Fx (Gy) Completed Fx Beam Energies  Scapula, Left: Chest_Lt_scap_ax IMRT 32.5/32.5 2.5 13/13 6X   Narrative: The patient tolerated radiation therapy relatively well. She had significant improvement in her Left shoulder pain during treatment.  Plan: The patient will follow-up with radiation oncology in 23mo, or as needed. -----------------------------------  Eppie Gibson, MD

## 2019-08-21 ENCOUNTER — Other Ambulatory Visit: Payer: Self-pay | Admitting: *Deleted

## 2019-08-21 DIAGNOSIS — I890 Lymphedema, not elsewhere classified: Secondary | ICD-10-CM

## 2019-08-21 DIAGNOSIS — M7989 Other specified soft tissue disorders: Secondary | ICD-10-CM

## 2019-08-21 DIAGNOSIS — Z807 Family history of other malignant neoplasms of lymphoid, hematopoietic and related tissues: Secondary | ICD-10-CM

## 2019-08-31 ENCOUNTER — Inpatient Hospital Stay: Payer: Medicare Other

## 2019-08-31 ENCOUNTER — Other Ambulatory Visit: Payer: Self-pay

## 2019-08-31 ENCOUNTER — Encounter: Payer: Self-pay | Admitting: Physical Therapy

## 2019-08-31 ENCOUNTER — Inpatient Hospital Stay: Payer: Medicare Other | Admitting: Oncology

## 2019-08-31 ENCOUNTER — Ambulatory Visit: Payer: Medicare Other | Attending: Oncology | Admitting: Physical Therapy

## 2019-08-31 DIAGNOSIS — M25512 Pain in left shoulder: Secondary | ICD-10-CM | POA: Insufficient documentation

## 2019-08-31 DIAGNOSIS — M25612 Stiffness of left shoulder, not elsewhere classified: Secondary | ICD-10-CM | POA: Insufficient documentation

## 2019-08-31 DIAGNOSIS — I890 Lymphedema, not elsewhere classified: Secondary | ICD-10-CM | POA: Diagnosis not present

## 2019-08-31 NOTE — Therapy (Signed)
[place], [place], [address] Phone: [phone]   Fax:  [phone]  Physical Therapy Re- Evaluation  Patient Details  Name: Stacey [name] MRN: [id number] Date of Birth: Feb 27, 1940 Referring Provider (PT): Lurline Del MD   Encounter Date: 08/31/2019   PT End of Session - 08/31/19 1951    Visit Number 1    Number of Visits 17    PT Start Time 0539    PT Stop Time 1655    PT Time Calculation (min) 50 min    Activity Tolerance Patient tolerated treatment well    Behavior During Therapy [facility] for tasks assessed/performed           Past Medical History:  Diagnosis Date  . ANEMIA-NOS   . ANXIETY   . Breast cancer ([place]) 1997 L, 2012 R   s/p chemo/xrt  . COPD    resolved  . DIVERTICULOSIS, COLON 2008  . Dizziness   . Family history of breast cancer   . Family history of lung cancer   . Family history of lymphoma   . Family history of pancreatic cancer   . Family history of uterine cancer   . GERD   . Hx of radiation therapy 10/19/11 -12/03/11   right breast  . HYPERLIPIDEMIA   . IRRITABLE BOWEL SYNDROME, HX OF   . Left-sided carotid artery disease (HCC)    moderate left ICA stenosis  . OSTEOARTHRITIS, HAND   . PSVT (paroxysmal supraventricular tachycardia) (HCC)    symptomatic on event monitor    Past Surgical History:  Procedure Laterality Date  . ABDOMINAL HYSTERECTOMY    . APPENDECTOMY    . BREAST BIOPSY  11/14/10    r breast: inv, insitu mammary carcinoma w/calcif, er/pr +, her2 -  . BREAST SURGERY     lumpectomy  . CATARACT EXTRACTION     both eyes  . ELECTROPHYSIOLOGIC STUDY N/A 05/10/2015   Procedure: SVT Ablation;  Surgeon: Will Meredith Leeds, MD;  Location: [facility];  Service: Cardiovascular;  Laterality: N/A;  . HERNIA REPAIR    . inguinal herniorrhapy  1984   left  . IR US GUIDE BX ASP/DRAIN  05/26/2019  . rectal fissure repair    . s/p benign breast biopsy   2003   right  . s/p left foot surgury  2009  . 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 spiral fx left foot  2008   no surgury  . TMJ ARTHROPLASTY  1989  . [place]- to remove scar tissue growth   . TONSILLECTOMY      There were no vitals filed for this visit.    Subjective Assessment - 08/31/19 1618    Subjective Pt returns to PT with worrsening of lymphedema of her left arm after treatment  of metastaitc breast cancer. She tried [place] and did not tolerate it.  Her symptoms of lymphedema are wosening. She does not want to be bandaged again. She comes in wearing a Tg soft as she says she does not want to go out of her house without something on her arm. She says the only garments she has for her arm are Tg soft.    Pertinent History Hx breast cancer with 15 lymph node removal on the L and Bil lumpectomy. Lumpectomy on the L in 1997and the R in 2012. Radiation Bil.  New discovery of metastatic breat and is trying  to find a treatment she can tolerate    Patient Stated Goals to get some releif  for her arm    Currently in Pain? Other (Comment)   reports she has pain in her arm especially in evening   Pain Score --   did not rate   Pain Location Arm    Pain Orientation Left    Pain Descriptors / Indicators Aching    Pain Type Acute pain              [facility] PT Assessment - 08/31/19 0001      Assessment   Medical Diagnosis LUE lymphedema     Referring Provider (PT) Lurline Del MD    Hand Dominance Right    Prior Therapy  lymphedema treatment  2/23/201 to 05/22/2019      Precautions   Precautions Other (comment)    Precaution Comments  metastatic breat cancer       Home Environment   Living Environment Private residence    Living Arrangements Alone    Type of [place] to enter    Entrance Stairs-Number of Steps 1    Entrance Stairs-Rails Right    Home Layout One level      Cognition   Overall Cognitive Status Within  Functional Limits for tasks assessed      Observation/Other Assessments   Observations pt comes in with obvious lymphedema in her left arm. She has boggy fullness above and below her elbow with pale translucent skin.  She has dry keratosis on lower arm.     Other Surveys  --   Lymphedema Life Impact Scale 61.76     Sensation   Light Touch Not tested      Coordination   Gross Motor Movements are Fluid and Coordinated No   very limited left shoulder movement      ROM / Strength   AROM / PROM / Strength AROM;Strength      AROM   Overall AROM  Deficits    Overall AROM Comments left shoulder very limited and painful     Right Shoulder Flexion --    Right Shoulder ABduction --    Right Shoulder Internal Rotation --    Right Shoulder External Rotation --    Left Shoulder Flexion 85 Degrees    Left Shoulder ABduction 70 Degrees    Left Shoulder External Rotation 70 Degrees      Strength   Overall Strength Deficits    Overall Strength Comments left shoulder about 2/5 limited by pain       Palpation   Palpation comment pitting edema in left arm, skin appears to be very fragile              LYMPHEDEMA/ONCOLOGY QUESTIONNAIRE - 08/31/19 0001      Type   Cancer Type L breast metastatic cancer, R breast cancer      Surgeries   Number Lymph Nodes Removed 15      Treatment   Active Chemotherapy Treatment Yes   trying to determine treatment she can tolerate    Past Chemotherapy Treatment Yes    Active Radiation Treatment No    Past Radiation Treatment Yes      What other symptoms do you have   Are you Having Heaviness or Tightness Yes    Are you having Pain Yes    Are you having pitting edema Yes    Body Site entire left arm and hand  Is it Hard or Difficult finding clothes that fit Yes    Do you have infections No    [place] Sign Yes      Lymphedema Stage   Stage STAGE 3 ELEPHANTIASIS      Left Upper Extremity Lymphedema   15 cm Proximal to Olecranon Process 37 cm      10 cm Proximal to Olecranon Process 41 cm    Olecranon Process 34.5 cm    15 cm Proximal to Ulnar Styloid Process 33 cm    10 cm Proximal to Ulnar Styloid Process 28.8 cm    Just Proximal to Ulnar Styloid Process 20 cm    Across Hand at [employer] 20 cm    At [place] of 2nd Digit 7 cm                   [facility]patient [facility]31/[facility]  Lymphedema Life Impact Scale Total Score 61.76 %      Objective measurements completed on examination: See above findings.       [facility] Adult PT Treatment/Exercise - 08/31/19 0001      Self-Care   Self-Care Other Self-Care Comments    Other Self-Care Comments  talked with pt about several options includeing circaid reduction kit,  tribute night, daytime garment and Edema wear She will order the small edema wear from [facility] and wear it on her arm as it should be easy to get on  Gave pt more tg soft today and instructed her to keep a deep fold at the top                     PT Short Term Goals - 05/22/19 1651      PT SHORT TERM GOAL #1   Title Pt will be independent with HEP and MLD within 2 weeks in order to decrease improve autonomy of care.    Status Partially Met             PT Long Term Goals - 08/31/19 1956      PT LONG TERM GOAL #1   Title Pt will report the pain in her left arm is decreased by 50%    Time 8    Period Weeks    Status New      PT LONG TERM GOAL #2   Title Patient to be properly fitted with appropriate compression garment to wear on daily basis for home management.    Time 8    Period Weeks    Status New      PT LONG TERM GOAL #3   Title Pt will report she knows how to manage her lymphedema at home    Time 8    Period Weeks    Status New      PT LONG TERM GOAL #4   Title Pt will have 90 degrees of left shoulder flexion and abduction so that she has an improved quality of  life    Time 8    Period Weeks    Status New      PT LONG  TERM GOAL #5   Title Pt will improve LLIS to 30% or less within 4 weeks in order to demonstrate objective improvement in functional activitie.    Baseline 61.76 on 08/31/2019    Time 8    Period Weeks    Status New  Plan - 08/31/19 1951    Clinical Impression Statement Pt comes back to PT with worsening lymphedema in her left arm and diagnosis of metastatic breast cancer.  She does not want to get bandaged again as it was not successful. Will start out with MLD manual techniques and gentle exercise to provide some comfort and decongestion and work with garment options to find something that she can use to manage her symptoms at home    Personal Factors and Comorbidities Comorbidity 2    Comorbidities Bil radiation due to Bil breast cancer and lumpectomy, 15 lymph node removal on the L    Stability/Clinical Decision Making Stable/Uncomplicated    Rehab Potential Good    PT Frequency 2x / week    PT Duration 8 weeks   likely will not need all visits   PT Treatment/Interventions Therapeutic activities;Therapeutic exercise;Iontophoresis 18m/ml Dexamethasone;Patient/family education;Manual techniques;Neuromuscular re-education    PT Next Visit Plan start with MLD and gentle P/AROM to left shoulder. did pt get [place]?  did pt use [place] funds already to get cami? continue to assess for garments for home    Consulted and Agree with Plan of Care Patient           Patient will benefit from skilled therapeutic intervention in order to improve the following deficits and impairments:  Decreased range of motion, Pain, Increased edema  Visit Diagnosis: Lymphedema, not elsewhere classified - Plan: PT plan of care cert/re-cert  Stiffness of left shoulder, not elsewhere classified - Plan: PT plan of care cert/re-cert  Left shoulder pain, unspecified chronicity - Plan: PT plan of care cert/re-cert     Problem List Patient Active Problem List   Diagnosis Date Noted  .  Genetic testing 06/21/2019  . Family history of breast cancer   . Family history of pancreatic cancer   . Family history of uterine cancer   . Family history of lymphoma   . Family history of lung cancer   . Bone metastases ([facility] 06/02/2019  . Recurrent cancer of left breast ([place] 05/15/2019  . Pes anserine bursitis 03/31/2019  . HTN (hypertension) 02/10/2019  . Hyperglycemia 06/21/2018  . Left carpal tunnel syndrome 02/22/2018  . Rotator cuff arthropathy of left shoulder 09/20/2017  . Cervical radiculitis 04/09/2017  . Left shoulder pain 04/09/2017  . Dyspnea on exertion 05/14/2016  . 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 Lymphedema of left arm 01/14/2016  . Left arm swelling 12/25/2015  . SVT (supraventricular tachycardia) ([facility]   . Left[place]sided carotid artery disease ([place] 03/08/2015  . Dizziness 01/24/2015  . Urinary frequency 01/13/2015  . Dizziness and giddiness 01/09/2015  . Gallstones 02/21/2013  . Malignant neoplasm of upper-inner quadrant of left breast in female, estrogen receptor positive ([place] 11/28/2012  . Muscle cramping 09/12/2012  . Right hip pain 09/12/2012  . Lower back pain 09/12/2012  . COPD GOLD I    . Hx of radiation therapy   . Right shoulder pain 09/14/2011  . Preventative health care 07/29/2010  . Skin lesion of left leg 07/29/2010  . Paresthesia 07/29/2010  . GERD 03/28/2010  . CONSTIPATION 03/28/2010  . Osteoarthrosis, 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  . Anxiety state 05/03/2009  . Hyperlipidemia 06/14/2007  . FATIGUE 06/14/2007  . 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 DIVERTICULOSIS, COLON 12/17/2006  . Cough 12/17/2006  . IRRITABLE BOWEL SYNDROME, HX OF 12/17/2006   TDonato Heinz BOwens Shark PT  BNorwood Levo7/29/2021, 8:02 PM  [facility]  [place], [place], [address] Phone: [phone]   Fax:  [phone]  Name: LIZANIA BOUCHARD MRN: [id number] Date of  Birth: September 09, 1940

## 2019-09-10 NOTE — Progress Notes (Signed)
ID: Stacey [name]   DOB: 05-Jan-1941  MR#: [id number]  OMV#:[id number]  Patient Care Team: Biagio Borg, MD as PCP - General (Internal Medicine) Tyler Pita, MD as Consulting Physician (Radiation Oncology) Roseanne Kaufman, MD as Consulting Physician (Orthopedic Surgery) Lyndal Pulley, DO as Consulting Physician (Family Medicine) Lakrisha Iseman, Virgie Dad, MD as Consulting Physician (Oncology) OTHER MD:   CHIEF COMPLAINT: metastatic breast cancer  CURRENT TREATMENT: anastrozole; palbociclib/Ibrance; denosumab/Xgeva every 12 weeks   INTERVAL HISTORY:   Stacey [name] for follow up of her now metastatic breast cancer.  She started her anastrozole 06/02/2019.  She tolerates this with no side effects that she is aware of.  She was started on palbociclib but developed significant diarrhea and it was held.  It was then resumed at increasing doses (beginning once weekly and eventually 3 times a week) starting early July.  She is tolerating 3 times a week doses, 3 weeks on 1 week off, without any diarrhea.  She still takes Questran daily however and when she tried to go every other day she again had some loose bowel movements.  She is also on denosumab/Xgeva, with her most recent dose 06/08/2019.  She has had persistent hypocalcemia due to this and her next dose is not scheduled until 09/28/2019.  We are following her CA 27-29: Results for Stacey [name], Stacey [name] (MRN [id number]) as of 09/11/2019 14:52  Ref. Range 05/31/2019 12:23 06/08/2019 13:58 07/06/2019 13:02 08/02/2019 12:30  CA 27.29 Latest Ref Range: 0.0 - 38.6 U/mL 101.4 (H) 107.4 (H) 79.7 (H) 66.5 (H)    REVIEW OF SYSTEMS: Stacey [name] is taking walks about a mile at a time,, on days that are not too hot.  She does not go the whole mile but has to sit and then go a little bit more.  In the winter she uses her treadmill.  She is very concerned regarding her prognosis because she lives alone and of course she is getting lots of off offers on her house.  She  just does not want her cell.  What she would like to do is know exactly what the best time is for her to move to a senior living arrangement.  Aside from these issues detailed review of systems [name] was stable.  HISTORY OF PRESENT ILLNESS:  From the original intake note:  " Stacey [name] is a 79 year old [place] woman, who has a history of breast cancer starting in 1997 and in 2013.    On 11/22/95, she underwent a left breast biopsy, which revealed malignant cells.  She had a left breast lumpectomy with re-excision on 11/29/95, with final pathology revealed IDC, grade 2, 2.5 cm, ER 98%, PR 97%, Ki67 8% with one of 15 nodes being positive.  She received chemotherapy, 4 cycles per patient, along with radiation and took Tamoxifen for 7 years following radiation completion.  Records from 1997 limited.    In October 2012, she had a biopsy of the right breast after mammography recommended additional images for a dense area in the right breast.  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  On 11/25/10, she had an MRI that measured the area in the right breast as 2.2 cm.  She started neoadjuvant [name] in November 2012 and has continued it since.  The area of concern measured 1.7 cm on a repeat MRI in March of 2013.  On  09/03/11, she underwent a right lumpectomy with sentinel lymph node biopsy with final pathology resulting invasive lobular carcinoma with calcifications, grade 2, 2.5 cm wit lobular carcinoma in situ.  Surgical margins were negative with one of one sentinel lymph node positive for metastatic carcinoma.  She then underwent radiation therapy under the care of Dr. [name] from 10/19/11 to 12/03/11. "   Her subsequent history is as detailed below   PAST MEDICAL HISTORY: Past Medical History:  Diagnosis Date   ANEMIA-NOS    ANXIETY    Breast cancer ([place]) 1997 L, 2012  R   s/p chemo/xrt   COPD    resolved   DIVERTICULOSIS, COLON 2008   Dizziness    Family history of breast cancer    Family history of lung cancer    Family history of lymphoma    Family history of pancreatic cancer    Family history of uterine cancer    GERD    Hx of radiation therapy 10/19/11 -12/03/11   right breast   HYPERLIPIDEMIA    IRRITABLE BOWEL SYNDROME, HX OF    Left-sided carotid artery disease (HCC)    moderate left ICA stenosis   OSTEOARTHRITIS, HAND    PSVT (paroxysmal supraventricular tachycardia) ([place])    symptomatic on event monitor    PAST SURGICAL HISTORY: Past Surgical History:  Procedure Laterality Date   ABDOMINAL HYSTERECTOMY     APPENDECTOMY     BREAST BIOPSY  11/14/10    r breast: inv, insitu mammary carcinoma w/calcif, er/pr +, her2 -   BREAST SURGERY     lumpectomy   CATARACT EXTRACTION     both eyes   ELECTROPHYSIOLOGIC STUDY N/A 05/10/2015   Procedure: SVT Ablation;  Surgeon: Will Meredith Leeds, MD;  Location: [facility];  Service: Cardiovascular;  Laterality: N/A;   HERNIA REPAIR     inguinal herniorrhapy  1984   left   IR US GUIDE BX ASP/DRAIN  05/26/2019   rectal fissure repair     s/p benign breast biopsy  2003   right   s/p left foot surgury  2009   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   spiral fx left foot  2008   no surgury   TMJ ARTHROPLASTY  1989   TONGUE SURGERY     1988- to remove scar tissue growth    TONSILLECTOMY      FAMILY HISTORY Family History  Problem Relation Age of Onset   Hypertension Mother    Stroke Mother    Colon polyps Mother    Diabetes Mother    Pancreatic cancer Mother 24   Uterine cancer Mother 27   Lymphoma Brother        burkitts   Lung cancer Paternal Uncle    Lung cancer Maternal Grandmother 7       non-smoker   Lung cancer Maternal Grandfather    Breast cancer Cousin        maternal cousin, dx in her mid 67s   Brain cancer  Cousin        maternal cousin's son; dx in his 21s   Testicular cancer Cousin        maternal cousin's son;    Breast cancer Cousin        paternal cousin; dx in her 86s   Breast cancer Cousin        paternal cousin's daughter; dx in 4s; neg genetic testing   Colon cancer Neg Hx  Esophageal cancer Neg Hx    Stomach cancer Neg Hx    Rectal cancer Neg Hx     GYNECOLOGIC HISTORY:   Menarche age 40, GX P0 (one miscarriage at 42 months). Status post vaginal hysterectomy in the late 1970s, no salpingo-oophorectomy    SOCIAL HISTORY:   The patient lives alone and is divorced. She retired from [employer], were she worked in Physiological scientist.Marland Kitchen     ADVANCED DIRECTIVES:  Living will in place; the patient's healthcare power of attorney is her stepdaughter, Vladimir Faster, who can be reached at [place]: Social History   Tobacco Use   Smoking status: Former Smoker    Packs/day: 1.00    Years: 54.00    Pack years: 54.00    Types: Cigarettes    Quit date: 01/03/2016    Years since quitting: 3.6   Smokeless tobacco: Never Used  Vaping Use   Vaping Use: Never used  Substance Use Topics   Alcohol use: Yes    Comment: rare/ drinks socially   Drug use: No    Allergies  Allergen Reactions   Bee Venom Swelling   Clindamycin/Lincomycin Diarrhea and Nausea And Vomiting   Codeine Nausea And Vomiting    Current Outpatient Medications  Medication Sig Dispense Refill   anastrozole (ARIMIDEX) 1 MG tablet Take 1 tablet (1 mg total) by mouth daily. 90 tablet 4   aspirin 81 MG EC tablet Take 81 mg by mouth daily.       calcium-vitamin D (OSCAL WITH D) 500-200 MG-UNIT per tablet Take 1 tablet by mouth daily.     cholecalciferol (VITAMIN D) 1000 UNITS tablet Take 1,000 Units by mouth daily.       cholestyramine (QUESTRAN) 4 g packet Take 1 packet (4 g total) by mouth 3 (three) times daily. 60 each 12   clotrimazole-betamethasone (LOTRISONE) cream Apply  1 application topically 2 (two) times daily as needed. 30 g 1   diclofenac sodium (VOLTAREN) 1 % GEL Apply 4 g topically 4 (four) times daily as needed. 400 g 11   diphenhydrAMINE (BENADRYL) 25 MG tablet Take 25 mg by mouth every 6 (six) hours as needed (for bee stings).      fluticasone (FLONASE) 50 MCG/ACT nasal spray Place 2 sprays into both nostrils daily.     loratadine (CLARITIN) 10 MG tablet Take 10 mg by mouth daily.     meloxicam (MOBIC) 15 MG tablet TAKE 1 TABLET BY MOUTH DAILY 90 tablet 1   Multiple Vitamin (MULTIVITAMIN) capsule Take 1 capsule by mouth daily.       omeprazole (PRILOSEC) 20 MG capsule TAKE 1 CAPSULE(20 MG) BY MOUTH DAILY 90 capsule 3   palbociclib (IBRANCE) 125 MG tablet Take 1 tablet (125 mg total) by mouth daily. Take for 21 days on, 7 days off, repeat every 28 days. 21 tablet 0   polyethylene glycol (MIRALAX / GLYCOLAX) packet take 1 packet once daily if needed for constipation 30 packet 11   prochlorperazine (COMPAZINE) 10 MG tablet Take 1 tablet (10 mg total) by mouth every 6 (six) hours as needed for nausea or vomiting. 30 tablet 1   pseudoephedrine (SUDAFED) 30 MG tablet Take 30 mg by mouth every 4 (four) hours as needed for congestion.      telmisartan (MICARDIS) 40 MG tablet Take 1 tablet (40 mg total) by mouth daily. 90 tablet 3   Tiotropium Bromide Monohydrate (SPIRIVA RESPIMAT) 2.5 MCG/ACT AERS Inhale 2 puffs into the lungs daily. 4  g 11   zolpidem (AMBIEN) 10 MG tablet TAKE 1 TABLET(10 MG) BY MOUTH AT BEDTIME AS NEEDED 90 tablet 1   No current facility-administered medications for this visit.    OBJECTIVE: White woman who appears stated age 18:   09/11/19 1453  BP: (!) 151/68  Pulse: 93  Resp: 18  Temp: (!) 97.4 F (36.3 C)  SpO2: 97%     Body mass index is 28.65 kg/m.     Sclerae unicteric, EOMs intact Wearing a mask No cervical or supraclavicular adenopathy Lungs no rales or rhonchi Heart regular rate and rhythm Abd  soft, nontender, positive bowel sounds MSK no focal spinal tenderness, no upper extremity lymphedema Neuro: nonfocal, well oriented, appropriate affect Breasts: Status post bilateral lumpectomies with bilateral breast irradiation, no superficial evidence of breast recurrence.  Both axillae are benign.   LAB RESULTS: Lab Results  Component Value Date   WBC 3.0 (L) 09/11/2019   NEUTROABS 1.8 09/11/2019   HGB 10.2 (L) 09/11/2019   HCT 29.5 (L) 09/11/2019   MCV 90.5 09/11/2019   PLT 166 09/11/2019      Chemistry      Component Value Date/Time   NA 142 09/11/2019 1433   NA 140 12/09/2015 1409   K 3.5 09/11/2019 1433   K 4.2 12/09/2015 1409   CL 110 09/11/2019 1433   CL 107 02/02/2012 0939   CO2 21 (L) 09/11/2019 1433   CO2 25 12/09/2015 1409   BUN 17 09/11/2019 1433   BUN 16.7 12/09/2015 1409   CREATININE 1.02 (H) 09/11/2019 1433   CREATININE 1.02 (H) 05/09/2019 1303   CREATININE 0.8 12/09/2015 1409      Component Value Date/Time   CALCIUM 8.9 09/11/2019 1433   CALCIUM 9.4 12/09/2015 1409   ALKPHOS 59 09/11/2019 1433   ALKPHOS 54 12/09/2015 1409   AST 19 09/11/2019 1433   AST 23 05/09/2019 1303   AST 20 12/09/2015 1409   ALT 12 09/11/2019 1433   ALT 13 05/09/2019 1303   ALT 13 12/09/2015 1409   BILITOT 0.3 09/11/2019 1433   BILITOT 0.4 05/09/2019 1303   BILITOT <0.22 12/09/2015 1409       Lab Results  Component Value Date   LABCA2 56 (H) 12/10/2010    No components found for: [id number]  No results for input(s): INR in the last 168 hours.  Urinalysis    Component Value Date/Time   COLORURINE YELLOW 07/13/2018 0938   APPEARANCEUR [place] (A) 07/13/2018 0938   LABSPEC 1.020 07/13/2018 0938   PHURINE 6.0 07/13/2018 0938   GLUCOSEU NEGATIVE 07/13/2018 0938   HGBUR NEGATIVE 07/13/2018 0938   BILIRUBINUR NEGATIVE 07/13/2018 0938   KETONESUR NEGATIVE 07/13/2018 0938   UROBILINOGEN 0.2 07/13/2018 0938   NITRITE NEGATIVE 07/13/2018 0938   LEUKOCYTESUR TRACE  (A) 07/13/2018 0938    STUDIES: No results found.    ASSESSMENT: 79 y.o. [place] woman:  LEFT BREAST #1  S/P LEFT breast lumpectomy with re-excision on 11/29/95 for a T2 N1bi stage IIb invasive ductal carcinoma , grade 2, estrogen receptor 98% positive, progesterone receptor 97% positive, Ki67 8%.  #2 status post 4 cycles of doxorubicin and cyclophosphamide,    (i) followed by radiation therapy under the care of Dr. [name].  #3 received tamoxifen for a total of seven years   RIGHT BREAST #4  S/P biopsy of the RIGHT breast upper inner quadrant on 11/14/10 showing invasive ductal carcinoma,, grade 2, estrogen receptor 85% and progesterone receptor 57% positive, Ki67 20%,  HER2 not amplified.    #5 started neoadjuvant letrozole in November 2012; switched to tamoxifen as of February 2016 due to osteopenia concerns  #6  S/P right lumpectomy with sentinel lymph node biopsy on 09/03/11 for a ypT2, ypN1a, stage IIB invasive lobular carcinoma, grade 2,estrogen receptor 97% positive, progesterone receptor 12% positive, with no HER-2 amplification.  #7 status post right breast radiation therapy under the care of Dr. [name] from 10/19/2011 to 12/03/2011.   #8 did not meet criteria for genetic testing according to her insurance company.  #9 osteopenia, with a T score of -1.8 on DEXA scan at [facility] 03/07/2013  (a) status post multiple dental extractions and implants  (b) repeat bone density at [facility] 04/02/2015 shows a T score of -2.0  METASTATIC DISEASE: April 2021 #10:  left upper extremity lymphedema led to chest CT scan 05/09/2019 showing a 5.1 cm left subpectoral chest wall mass and thoracic lymphadenopathy   (a) CT biopsy of the chest wall mass 05/25/2019 confirms recurrent breast cancer, strongly estrogen and progesterone receptor positive, HER-2 not amplified  (b) PET scan 05/30/2019 shows a left subpectoral mass measuring 5.4 cm, with significant regional and mediastinal adenopathy,  sclerotic left scapular metastasis, but no liver or lung involvement.  (c) CA 27-29 is informative  #11 anastrozole started 06/02/2019; palbociclib added 06/08/2019  #12 denosumab/Xgeva starting 06/08/2019, to be repeated every 3 months due to hypocalcemia  #13 genetics testing pending   PLAN:   Sianni tolerates the 125 mg of Ibrance Monday Wednesday Friday well and she has quite a supply since they mailed her 1 bottle after she had stopped.  Accordingly instead of trying to continue to adjust the dose were going to just run through those samples.  She will resume a dose every Monday Wednesday and Friday on 8/16, her next cycle will start on 9/ 13 and then she will return on October 11 and see me that same day.  Before that visit she will be restaged with a CT of the chest.  After that we likely will go to 100 mg 5 days on and 2 days off which more closely approximates what was used in the original study.  I encouraged her to continue to exercise as much as she can.  She is very motivated.  She really does not want to sell her house and move.  On the other hand she knows that at some point she will decline and at that point she would of course want to be able to do that.  I simply cannot tell her when that point is going to be.  She could do very well for the next 2 years where she could decline rapidly.  Certainly it would be better if she sold her house and moved now since prices are so good but she hates to lose her independence and that is a strong motivating factor for her  She knows to call for any other issue that may develop before her October visit  Total encounter time 35 minutes.  Virgie Dad. Alena Blankenbeckler, MD 09/11/19 3:30 PM Medical Oncology and Hematology [facility], [place] [address] Tel. [phone]    Fax. [phone]   I, Wilburn Mylar, am acting as scribe for Dr. Virgie Dad. Cyprian Gongaware.  I, Lurline Del MD, have reviewed the above  documentation for accuracy and completeness, and I agree with the above.    *Total Encounter Time as defined by the Centers for Medicare and Medicaid Services  includes, in addition to the face-to-face time of a patient visit (documented in the note above) non-face-to-face time: obtaining and reviewing outside history, ordering and reviewing medications, tests or procedures, care coordination (communications with other health care professionals or caregivers) and documentation in the medical record.

## 2019-09-11 ENCOUNTER — Telehealth: Payer: Self-pay | Admitting: Oncology

## 2019-09-11 ENCOUNTER — Other Ambulatory Visit: Payer: Self-pay

## 2019-09-11 ENCOUNTER — Inpatient Hospital Stay: Payer: Medicare Other | Attending: Adult Health | Admitting: Oncology

## 2019-09-11 ENCOUNTER — Inpatient Hospital Stay: Payer: Medicare Other

## 2019-09-11 VITALS — BP 151/68 | HR 93 | Temp 97.4°F | Resp 18 | Ht 63.75 in | Wt 165.6 lb

## 2019-09-11 DIAGNOSIS — Z79899 Other long term (current) drug therapy: Secondary | ICD-10-CM | POA: Insufficient documentation

## 2019-09-11 DIAGNOSIS — C50912 Malignant neoplasm of unspecified site of left female breast: Secondary | ICD-10-CM

## 2019-09-11 DIAGNOSIS — Z170 Estrogen receptor positive status [ER+]: Secondary | ICD-10-CM

## 2019-09-11 DIAGNOSIS — C7951 Secondary malignant neoplasm of bone: Secondary | ICD-10-CM

## 2019-09-11 DIAGNOSIS — C50212 Malignant neoplasm of upper-inner quadrant of left female breast: Secondary | ICD-10-CM | POA: Diagnosis not present

## 2019-09-11 DIAGNOSIS — C7989 Secondary malignant neoplasm of other specified sites: Secondary | ICD-10-CM | POA: Diagnosis not present

## 2019-09-11 DIAGNOSIS — R9389 Abnormal findings on diagnostic imaging of other specified body structures: Secondary | ICD-10-CM

## 2019-09-11 DIAGNOSIS — C50211 Malignant neoplasm of upper-inner quadrant of right female breast: Secondary | ICD-10-CM

## 2019-09-11 DIAGNOSIS — Z79810 Long term (current) use of selective estrogen receptor modulators (SERMs): Secondary | ICD-10-CM | POA: Diagnosis not present

## 2019-09-11 DIAGNOSIS — M8580 Other specified disorders of bone density and structure, unspecified site: Secondary | ICD-10-CM | POA: Insufficient documentation

## 2019-09-11 DIAGNOSIS — Z923 Personal history of irradiation: Secondary | ICD-10-CM | POA: Diagnosis not present

## 2019-09-11 DIAGNOSIS — C50911 Malignant neoplasm of unspecified site of right female breast: Secondary | ICD-10-CM

## 2019-09-11 LAB — CBC WITH DIFFERENTIAL/PLATELET
Abs Immature Granulocytes: 0.01 10*3/uL (ref 0.00–0.07)
Basophils Absolute: 0 10*3/uL (ref 0.0–0.1)
Basophils Relative: 1 %
Eosinophils Absolute: 0.1 10*3/uL (ref 0.0–0.5)
Eosinophils Relative: 2 %
HCT: 29.5 % — ABNORMAL LOW (ref 36.0–46.0)
Hemoglobin: 10.2 g/dL — ABNORMAL LOW (ref 12.0–15.0)
Immature Granulocytes: 0 %
Lymphocytes Relative: 30 %
Lymphs Abs: 0.9 10*3/uL (ref 0.7–4.0)
MCH: 31.3 pg (ref 26.0–34.0)
MCHC: 34.6 g/dL (ref 30.0–36.0)
MCV: 90.5 fL (ref 80.0–100.0)
Monocytes Absolute: 0.3 10*3/uL (ref 0.1–1.0)
Monocytes Relative: 8 %
Neutro Abs: 1.8 10*3/uL (ref 1.7–7.7)
Neutrophils Relative %: 59 %
Platelets: 166 10*3/uL (ref 150–400)
RBC: 3.26 MIL/uL — ABNORMAL LOW (ref 3.87–5.11)
RDW: 18.3 % — ABNORMAL HIGH (ref 11.5–15.5)
WBC: 3 10*3/uL — ABNORMAL LOW (ref 4.0–10.5)
nRBC: 0 % (ref 0.0–0.2)

## 2019-09-11 LAB — COMPREHENSIVE METABOLIC PANEL
ALT: 12 U/L (ref 0–44)
AST: 19 U/L (ref 15–41)
Albumin: 3.5 g/dL (ref 3.5–5.0)
Alkaline Phosphatase: 59 U/L (ref 38–126)
Anion gap: 11 (ref 5–15)
BUN: 17 mg/dL (ref 8–23)
CO2: 21 mmol/L — ABNORMAL LOW (ref 22–32)
Calcium: 8.9 mg/dL (ref 8.9–10.3)
Chloride: 110 mmol/L (ref 98–111)
Creatinine, Ser: 1.02 mg/dL — ABNORMAL HIGH (ref 0.44–1.00)
GFR calc Af Amer: >60 mL/min (ref >60)
GFR calc non Af Amer: 52 mL/min — ABNORMAL LOW (ref >60)
Glucose, Bld: 99 mg/dL (ref 70–99)
Potassium: 3.5 mmol/L (ref 3.5–5.1)
Sodium: 142 mmol/L (ref 135–145)
Total Bilirubin: 0.3 mg/dL (ref 0.3–1.2)
Total Protein: 6.7 g/dL (ref 6.5–8.1)

## 2019-09-11 NOTE — Telephone Encounter (Signed)
Scheduled appts per 8/9 los. Pt declined printout of AVS and stated she would refer to mychart.

## 2019-09-12 LAB — CANCER ANTIGEN 27.29: CA 27.29: 51.5 U/mL — ABNORMAL HIGH (ref 0.0–38.6)

## 2019-09-18 ENCOUNTER — Ambulatory Visit: Payer: Medicare Other | Attending: Oncology

## 2019-09-18 ENCOUNTER — Other Ambulatory Visit: Payer: Self-pay

## 2019-09-18 ENCOUNTER — Encounter: Payer: Self-pay | Admitting: Oncology

## 2019-09-18 DIAGNOSIS — M25512 Pain in left shoulder: Secondary | ICD-10-CM | POA: Diagnosis not present

## 2019-09-18 DIAGNOSIS — M25612 Stiffness of left shoulder, not elsewhere classified: Secondary | ICD-10-CM | POA: Diagnosis not present

## 2019-09-18 DIAGNOSIS — Z86000 Personal history of in-situ neoplasm of breast: Secondary | ICD-10-CM | POA: Diagnosis not present

## 2019-09-18 DIAGNOSIS — I890 Lymphedema, not elsewhere classified: Secondary | ICD-10-CM | POA: Diagnosis not present

## 2019-09-18 NOTE — Therapy (Signed)
[place], [place], [address] Phone: [phone]   Fax:  [phone]  Physical Therapy Treatment  Patient Details  Name: Stacey [name] MRN: [id number] Date of Birth: 1940-06-27 Referring Provider (PT): Lurline Del MD   Encounter Date: 09/18/2019   PT End of Session - 09/18/19 1657    Visit Number 2    Number of Visits 17    Date for PT Re-Evaluation 11/01/19    PT Start Time 1502    PT Stop Time 1602    PT Time Calculation (min) 60 min    Activity Tolerance Patient tolerated treatment well;Patient limited by pain    Behavior During Therapy [facility] for tasks assessed/performed           Past Medical History:  Diagnosis Date  . ANEMIA-NOS   . ANXIETY   . Breast cancer ([place]) 1997 L, 2012 R   s/p chemo/xrt  . COPD    resolved  . DIVERTICULOSIS, COLON 2008  . Dizziness   . Family history of breast cancer   . Family history of lung cancer   . Family history of lymphoma   . Family history of pancreatic cancer   . Family history of uterine cancer   . GERD   . Hx of radiation therapy 10/19/11 -12/03/11   right breast  . HYPERLIPIDEMIA   . IRRITABLE BOWEL SYNDROME, HX OF   . Left-sided carotid artery disease (HCC)    moderate left ICA stenosis  . OSTEOARTHRITIS, HAND   . PSVT (paroxysmal supraventricular tachycardia) (HCC)    symptomatic on event monitor    Past Surgical History:  Procedure Laterality Date  . ABDOMINAL HYSTERECTOMY    . APPENDECTOMY    . BREAST BIOPSY  11/14/10    r breast: inv, insitu mammary carcinoma w/calcif, er/pr +, her2 -  . BREAST SURGERY     lumpectomy  . CATARACT EXTRACTION     both eyes  . ELECTROPHYSIOLOGIC STUDY N/A 05/10/2015   Procedure: SVT Ablation;  Surgeon: Will Meredith Leeds, MD;  Location: [facility];  Service: Cardiovascular;  Laterality: N/A;  . HERNIA REPAIR    . inguinal herniorrhapy  1984   left  . IR US GUIDE BX ASP/DRAIN  05/26/2019   . rectal fissure repair    . s/p benign breast biopsy  2003   right  . s/p left foot surgury  2009  . 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 spiral fx left foot  2008   no surgury  . TMJ ARTHROPLASTY  1989  . [place]- to remove scar tissue growth   . TONSILLECTOMY      There were no vitals filed for this visit.   Subjective Assessment - 09/18/19 1507    Subjective I started taking Ibrance again this week and they lessened the dosage alot to see if I tolerate this better. Other than that, no changes since I was here last for my evaluation.    Pertinent History Hx breast cancer with 15 lymph node removal on the L and Bil lumpectomy. Lumpectomy on the L in 1997and the R in 2012. Radiation Bil.  New discovery of metastatic breat and is trying to find a treatment she can tolerate    Patient Stated Goals to get some releif  for her arm    Currently in Pain? No/denies   Lt arm just feels very uncomfortable  [facility]patient [facility]  Lymphedema Life Impact Scale Total Score 61.76 %            [facility] Adult PT Treatment/Exercise - 09/18/19 0001      Shoulder Exercises: Pulleys   Flexion 2 minutes    Flexion Limitations Pt returned therapist demo and tactile cues to relax Lt shoulder, She tolerated this better than P/ROM    Scaption 2 minutes    Scaption Limitations VCs to relax Lt shoulder throughout      Manual Therapy   Manual Therapy Manual Lymphatic Drainage (MLD);Passive ROM;Neural Stretch    Manual Lymphatic Drainage (MLD) In Supine: short neck, 5 diaphragmatic breaths, Rt axillary and Lt inguinal nodes, anterior inter-axillary and Lt axillo-inguinal anastomosis, then focused on Lt UE working from lateral upper arm to dorsal hand, then retacing all steps back to pathways.     Passive ROM In Supine very gently to Lt shoulder into flexion and abduction but pt very  apprehensive about motion and troubling relaxing guarded posture    Neural Stretch Tried this briefly as she was having sharp pain at forearm intermittently, it seemed less after neural stretching                  PT Education - 09/18/19 1705    Education Details Reprinted HEP from last episode thru [place], access code [facility] for AA/ROM flexion in supine with fingers clasped, fingers clasped behind head and open/close elbows, hand fist pump    Person(s) Educated Patient    Methods Explanation;Demonstration;Handout    Comprehension Verbalized understanding;Need further instruction               PT Long Term Goals - 08/31/19 1956      PT LONG TERM GOAL #1   Title Pt will report the pain in her left arm is decreased by 50%    Time 8    Period Weeks    Status New      PT LONG TERM GOAL #2   Title Patient to be properly fitted with appropriate compression garment to wear on daily basis for home management.    Time 8    Period Weeks    Status New      PT LONG TERM GOAL #3   Title Pt will report she knows how to manage her lymphedema at home    Time 8    Period Weeks    Status New      PT LONG TERM GOAL #4   Title Pt will have 90 degrees of left shoulder flexion and abduction so that she has an improved quality of  life    Time 8    Period Weeks    Status New      PT LONG TERM GOAL #5   Title Pt will improve LLIS to 30% or less within 4 weeks in order to demonstrate objective improvement in functional activitie.    Baseline 61.76 on 08/31/2019    Time 8    Period Weeks    Status New                 Plan - 09/18/19 1658    Clinical Impression Statement Pt came in wearing thick stockinette on her Lt UE reporting this is all the compresion she can stand at the time. Also reports that when she wears more than this, like trying to double up on stockinettes, its too tight and doesn't seem to make  swelling any better. Today focused on manual lymph drainage  to Lt UE with gentle P/ROM of Lt shoulder. She had trouble tolerating the P/ROM due to not able to relax out of guarded posture well. So tried pulleys at end of session instead and she was able to get arm higher with less pain, though holding pulleys was a challenge towards end due to numbness in Lt hand/fingers. Pt reports she would only like to cont therapy to end of the month.    Personal Factors and Comorbidities Comorbidity 2    Comorbidities Bil radiation due to Bil breast cancer and lumpectomy, 15 lymph node removal on the L    Stability/Clinical Decision Making Stable/Uncomplicated    Rehab Potential Good    PT Frequency 2x / week    PT Duration 8 weeks   likely will not need all visits   PT Treatment/Interventions Therapeutic activities;Therapeutic exercise;Iontophoresis 25m/ml Dexamethasone;Patient/family education;Manual techniques;Neuromuscular re-education    PT Next Visit Plan Pt only wants to come for remaining 3 visits so progress HEP prn and cont MLD and gentle P/AROM to left shoulder. did pt get [employer]?  did pt use [employer] funds already to get cami? continue to assess for garments for home    PT Home Exercise Plan [employer] access code: [email or web address]   Consulted and Agree with Plan of Care Patient           Patient will benefit from skilled therapeutic intervention in order to improve the following deficits and impairments:  Decreased range of motion, Pain, Increased edema  Visit Diagnosis: Lymphedema, not elsewhere classified  Stiffness of left shoulder, not elsewhere classified  Left shoulder pain, unspecified chronicity     Problem List Patient Active Problem List   Diagnosis Date Noted  . Genetic testing 06/21/2019  . Family history of breast cancer   . Family history of pancreatic cancer   . Family history of uterine cancer   . Family history of lymphoma   . Family history of lung cancer   . Bone metastases ([place] 06/02/2019  . Recurrent cancer of left breast  ([facility] 05/15/2019  . Pes anserine bursitis 03/31/2019  . HTN (hypertension) 02/10/2019  . Hyperglycemia 06/21/2018  . Left carpal tunnel syndrome 02/22/2018  . Rotator cuff arthropathy of left shoulder 09/20/2017  . Cervical radiculitis 04/09/2017  . Left shoulder pain 04/09/2017  . Dyspnea on exertion 05/14/2016  . 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 Lymphedema of left arm 01/14/2016  . Left arm swelling 12/25/2015  . SVT (supraventricular tachycardia) ([facility]   . Left-sided carotid artery disease ([facility] 03/08/2015  . Dizziness 01/24/2015  . Urinary frequency 01/13/2015  . Dizziness and giddiness 01/09/2015  . Gallstones 02/21/2013  . Malignant neoplasm of upper-inner quadrant of left breast in female, estrogen receptor positive ([facility] 11/28/2012  . Muscle cramping 09/12/2012  . Right hip pain 09/12/2012  . Lower back pain 09/12/2012  . COPD GOLD I    . Hx of radiation therapy   . Right shoulder pain 09/14/2011  . Preventative health care 07/29/2010  . Skin lesion of left leg 07/29/2010  . Paresthesia 07/29/2010  . GERD 03/28/2010  . CONSTIPATION 03/28/2010  . Osteoarthrosis, 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  . Anxiety state 05/03/2009  . Hyperlipidemia 06/14/2007  . FATIGUE 06/14/2007  . 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 DIVERTICULOSIS, COLON 12/17/2006  . Cough 12/17/2006  . IRRITABLE BOWEL SYNDROME, HX OF 12/17/2006    ROtelia Limes PTA 09/18/2019, 5:07  PM  [place], [place], [address] Phone: [phone]   Fax:  [phone]  Name: Stacey [name] MRN: [id number] Date of Birth: November 26, 1940

## 2019-09-20 ENCOUNTER — Other Ambulatory Visit: Payer: Self-pay

## 2019-09-20 ENCOUNTER — Ambulatory Visit: Payer: Medicare Other | Admitting: Physical Therapy

## 2019-09-20 ENCOUNTER — Encounter: Payer: Self-pay | Admitting: Physical Therapy

## 2019-09-20 DIAGNOSIS — M25512 Pain in left shoulder: Secondary | ICD-10-CM | POA: Diagnosis not present

## 2019-09-20 DIAGNOSIS — Z86000 Personal history of in-situ neoplasm of breast: Secondary | ICD-10-CM | POA: Diagnosis not present

## 2019-09-20 DIAGNOSIS — I890 Lymphedema, not elsewhere classified: Secondary | ICD-10-CM | POA: Diagnosis not present

## 2019-09-20 DIAGNOSIS — M25612 Stiffness of left shoulder, not elsewhere classified: Secondary | ICD-10-CM | POA: Diagnosis not present

## 2019-09-20 DIAGNOSIS — Z23 Encounter for immunization: Secondary | ICD-10-CM | POA: Diagnosis not present

## 2019-09-20 NOTE — Therapy (Signed)
[place], [place], [address] Phone: [phone]   Fax:  [phone]  Physical Therapy Treatment  Patient Details  Name: Stacey [name] MRN: [id number] Date of Birth: Nov 05, 1940 Referring Provider (PT): Lurline Del MD   Encounter Date: 09/20/2019   PT End of Session - 09/20/19 1623    Visit Number 3    Number of Visits 17    Date for PT Re-Evaluation 11/01/19    PT Start Time 1400    PT Stop Time 1445    PT Time Calculation (min) 45 min    Activity Tolerance Patient tolerated treatment well;Patient limited by pain    Behavior During Therapy [facility] for tasks assessed/performed           Past Medical History:  Diagnosis Date  . ANEMIA-NOS   . ANXIETY   . Breast cancer ([place]) 1997 L, 2012 R   s/p chemo/xrt  . COPD    resolved  . DIVERTICULOSIS, COLON 2008  . Dizziness   . Family history of breast cancer   . Family history of lung cancer   . Family history of lymphoma   . Family history of pancreatic cancer   . Family history of uterine cancer   . GERD   . Hx of radiation therapy 10/19/11 -12/03/11   right breast  . HYPERLIPIDEMIA   . IRRITABLE BOWEL SYNDROME, HX OF   . Left-sided carotid artery disease (HCC)    moderate left ICA stenosis  . OSTEOARTHRITIS, HAND   . PSVT (paroxysmal supraventricular tachycardia) (HCC)    symptomatic on event monitor    Past Surgical History:  Procedure Laterality Date  . ABDOMINAL HYSTERECTOMY    . APPENDECTOMY    . BREAST BIOPSY  11/14/10    r breast: inv, insitu mammary carcinoma w/calcif, er/pr +, her2 -  . BREAST SURGERY     lumpectomy  . CATARACT EXTRACTION     both eyes  . ELECTROPHYSIOLOGIC STUDY N/A 05/10/2015   Procedure: SVT Ablation;  Surgeon: Will Meredith Leeds, MD;  Location: [facility];  Service: Cardiovascular;  Laterality: N/A;  . HERNIA REPAIR    . inguinal herniorrhapy  1984   left  . IR US GUIDE BX ASP/DRAIN  05/26/2019   . rectal fissure repair    . s/p benign breast biopsy  2003   right  . s/p left foot surgury  2009  . 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 spiral fx left foot  2008   no surgury  . TMJ ARTHROPLASTY  1989  . [place]- to remove scar tissue growth   . TONSILLECTOMY      There were no vitals filed for this visit.   Subjective Assessment - 09/20/19 1406    Subjective Pt says that she got the edema wear but she has not tried it yet.  She says she only wears compression at home only sometimes.  She usually only wears compression when she goes out and she feels that the tg soft is less obtusive.  She says that the compression does not make her arm better and she does not expect it to. She hsays that her arm swelling gets harder during the day especially at her thumb and first finger    Pertinent History Hx breast cancer with 15 lymph node removal on the L and Bil lumpectomy. Lumpectomy on the L in 1997and the R in  2012. Radiation Bil.  New discovery of metastatic breat and is trying to find a treatment she can tolerate    Patient Stated Goals to get some releif  for her arm    Currently in Pain? No/denies                       [facility]patient [facility]  Lymphedema Life Impact Scale Total Score 61.76 %            [facility] Adult PT Treatment/Exercise - 09/20/19 0001      Exercises   Exercises Shoulder      Shoulder Exercises: Supine   Flexion AAROM;Both;5 reps   with dowel    Other Supine Exercises with assist, small cirlves with hand pointed to ceiling       Manual Therapy   Manual Therapy Edema management;Manual Lymphatic Drainage (MLD);Passive ROM    Edema Management provided med TG soft to arm. Asked pt to bring in edema wear for next time to see if she can use it at home.  Sent a note to see if pt has used [employer] or if she could get a [employer] as possible option for home      Manual Lymphatic Drainage (MLD) In Supine: short neck, 5 diaphragmatic breaths, Rt axillary and Lt inguinal nodes, anterior inter-axillary and Lt axillo-inguinal anastomosis, then focused on Lt UE working from lateral upper arm to dorsal hand, then retacing all steps back to pathways.     Passive ROM gently to left shoulder                        PT Long Term Goals - 08/31/19 1956      PT LONG TERM GOAL #1   Title Pt will report the pain in her left arm is decreased by 50%    Time 8    Period Weeks    Status New      PT LONG TERM GOAL #2   Title Patient to be properly fitted with appropriate compression garment to wear on daily basis for home management.    Time 8    Period Weeks    Status New      PT LONG TERM GOAL #3   Title Pt will report she knows how to manage her lymphedema at home    Time 8    Period Weeks    Status New      PT LONG TERM GOAL #4   Title Pt will have 90 degrees of left shoulder flexion and abduction so that she has an improved quality of  life    Time 8    Period Weeks    Status New      PT LONG TERM GOAL #5   Title Pt will improve LLIS to 30% or less within 4 weeks in order to demonstrate objective improvement in functional activitie.    Baseline 61.76 on 08/31/2019    Time 8    Period Weeks    Status New                 Plan - 09/20/19 1624    Clinical Impression Statement Pt continues to have persistent lymphedem in her left arm She does not feel it is getting better, nor will it get better.  She is only wearing tg soft sometimes. Discussed possibility of using a [employer] for palliation in the evenings  when her arm gets more congested and painful, but she is not sure if she used [employer]  She does not want to get a pump even though I am not sure that would be appropriate with her disease. She does find the MLD to ease the discomfort in her arm    Personal Factors and Comorbidities Comorbidity 2    Comorbidities Bil  radiation due to Bil breast cancer and lumpectomy, 15 lymph node removal on the L    Stability/Clinical Decision Making Stable/Uncomplicated    Rehab Potential Good    PT Frequency 2x / week    PT Duration 8 weeks    PT Treatment/Interventions Therapeutic activities;Therapeutic exercise;Iontophoresis 49m/ml Dexamethasone;Patient/family education;Manual techniques;Neuromuscular re-education    PT Next Visit Plan Pt only wants to come for remaining 3 visits so progress HEP prn and cont MLD and gentle P/AROM to left shoulder. If she brought in edema wear, try in on pt to see if she would use it at home in the evenings to decrease congestion.   did pt use [employer] funds already to get cami? continue to assess for garments for home    PT Home Exercise Plan [facility] access code: [facility]          Patient will benefit from skilled therapeutic intervention in order to improve the following deficits and impairments:  Decreased range of motion, Pain, Increased edema  Visit Diagnosis: Lymphedema, not elsewhere classified  Stiffness of left shoulder, not elsewhere classified  Left shoulder pain, unspecified chronicity  History of ductal carcinoma in situ (DCIS) of breast     Problem List Patient Active Problem List   Diagnosis Date Noted  . Genetic testing 06/21/2019  . Family history of breast cancer   . Family history of pancreatic cancer   . Family history of uterine cancer   . Family history of lymphoma   . Family history of lung cancer   . Bone metastases ([facility] 06/02/2019  . Recurrent cancer of left breast ([facility] 05/15/2019  . Pes anserine bursitis 03/31/2019  . HTN (hypertension) 02/10/2019  . Hyperglycemia 06/21/2018  . Left carpal tunnel syndrome 02/22/2018  . Rotator cuff arthropathy of left shoulder 09/20/2017  . Cervical radiculitis 04/09/2017  . Left shoulder pain 04/09/2017  . Dyspnea on exertion 05/14/2016  . History of ductal carcinoma in situ (DCIS) of breast  01/14/2016  . Lymphedema of left arm 01/14/2016  . Left arm swelling 12/25/2015  . SVT (supraventricular tachycardia) ([facility]   . Left-sided carotid artery disease ([facility] 03/08/2015  . Dizziness 01/24/2015  . Urinary frequency 01/13/2015  . Dizziness and giddiness 01/09/2015  . Gallstones 02/21/2013  . Malignant neoplasm of upper-inner quadrant of left breast in female, estrogen receptor positive ([facility] 11/28/2012  . Muscle cramping 09/12/2012  . Right hip pain 09/12/2012  . Lower back pain 09/12/2012  . COPD GOLD I    . Hx of radiation therapy   . Right shoulder pain 09/14/2011  . Preventative health care 07/29/2010  . Skin lesion of left leg 07/29/2010  . Paresthesia 07/29/2010  . GERD 03/28/2010  . CONSTIPATION 03/28/2010  . Osteoarthrosis, 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  . Anxiety state 05/03/2009  . Hyperlipidemia 06/14/2007  . FATIGUE 06/14/2007  . 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 DIVERTICULOSIS, COLON 12/17/2006  . Cough 12/17/2006  . IRRITABLE BOWEL SYNDROME, HX OF 12/17/2006   TDonato Heinz BOwens Shark PT  BNorwood Levo8/18/2021, 4:29 PM  [address]  [place], [address] Phone: [phone]   Fax:  [phone]  Name: JAZIYA OBARR MRN: [id number] Date of Birth: [birth year]

## 2019-09-25 DIAGNOSIS — D225 Melanocytic nevi of trunk: Secondary | ICD-10-CM | POA: Diagnosis not present

## 2019-09-25 DIAGNOSIS — L578 Other skin changes due to chronic exposure to nonionizing radiation: Secondary | ICD-10-CM | POA: Diagnosis not present

## 2019-09-25 DIAGNOSIS — Z86018 Personal history of other benign neoplasm: Secondary | ICD-10-CM | POA: Diagnosis not present

## 2019-09-25 DIAGNOSIS — L820 Inflamed seborrheic keratosis: Secondary | ICD-10-CM | POA: Diagnosis not present

## 2019-09-25 DIAGNOSIS — L814 Other melanin hyperpigmentation: Secondary | ICD-10-CM | POA: Diagnosis not present

## 2019-09-25 DIAGNOSIS — L570 Actinic keratosis: Secondary | ICD-10-CM | POA: Diagnosis not present

## 2019-09-25 DIAGNOSIS — L821 Other seborrheic keratosis: Secondary | ICD-10-CM | POA: Diagnosis not present

## 2019-09-26 ENCOUNTER — Other Ambulatory Visit: Payer: Self-pay

## 2019-09-26 ENCOUNTER — Encounter: Payer: Self-pay | Admitting: Physical Therapy

## 2019-09-26 ENCOUNTER — Ambulatory Visit: Payer: Medicare Other | Admitting: Physical Therapy

## 2019-09-26 DIAGNOSIS — I890 Lymphedema, not elsewhere classified: Secondary | ICD-10-CM

## 2019-09-26 DIAGNOSIS — M25512 Pain in left shoulder: Secondary | ICD-10-CM

## 2019-09-26 DIAGNOSIS — M25612 Stiffness of left shoulder, not elsewhere classified: Secondary | ICD-10-CM

## 2019-09-26 DIAGNOSIS — Z86000 Personal history of in-situ neoplasm of breast: Secondary | ICD-10-CM | POA: Diagnosis not present

## 2019-09-26 NOTE — Therapy (Signed)
[place], [place], [address] Phone: [phone]   Fax:  [phone]  Physical Therapy Treatment  Patient Details  Name: Stacey [name] MRN: [id number] Date of Birth: 1940-03-21 Referring Provider (PT): Lurline Del MD   Encounter Date: 09/26/2019   PT End of Session - 09/26/19 1457    Visit Number 4    Number of Visits 17    Date for PT Re-Evaluation 11/01/19    PT Start Time 1400    PT Stop Time 1457    PT Time Calculation (min) 57 min    Activity Tolerance Patient tolerated treatment well    Behavior During Therapy [facility] for tasks assessed/performed           Past Medical History:  Diagnosis Date  . ANEMIA-NOS   . ANXIETY   . Breast cancer ([place]) 1997 L, 2012 R   s/p chemo/xrt  . COPD    resolved  . DIVERTICULOSIS, COLON 2008  . Dizziness   . Family history of breast cancer   . Family history of lung cancer   . Family history of lymphoma   . Family history of pancreatic cancer   . Family history of uterine cancer   . GERD   . Hx of radiation therapy 10/19/11 -12/03/11   right breast  . HYPERLIPIDEMIA   . IRRITABLE BOWEL SYNDROME, HX OF   . Left-sided carotid artery disease (HCC)    moderate left ICA stenosis  . OSTEOARTHRITIS, HAND   . PSVT (paroxysmal supraventricular tachycardia) (HCC)    symptomatic on event monitor    Past Surgical History:  Procedure Laterality Date  . ABDOMINAL HYSTERECTOMY    . APPENDECTOMY    . BREAST BIOPSY  11/14/10    r breast: inv, insitu mammary carcinoma w/calcif, er/pr +, her2 -  . BREAST SURGERY     lumpectomy  . CATARACT EXTRACTION     both eyes  . ELECTROPHYSIOLOGIC STUDY N/A 05/10/2015   Procedure: SVT Ablation;  Surgeon: Will Meredith Leeds, MD;  Location: [facility];  Service: Cardiovascular;  Laterality: N/A;  . HERNIA REPAIR    . inguinal herniorrhapy  1984   left  . IR US GUIDE BX ASP/DRAIN  05/26/2019  . rectal fissure  repair    . s/p benign breast biopsy  2003   right  . s/p left foot surgury  2009  . 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 spiral fx left foot  2008   no surgury  . TMJ ARTHROPLASTY  1989  . [place]- to remove scar tissue growth   . TONSILLECTOMY      There were no vitals filed for this visit.   Subjective Assessment - 09/26/19 1402    Subjective I tried the edema wear and I could not get the thing on. I just absolutely can not wear it.    Pertinent History Hx breast cancer with 15 lymph node removal on the L and Bil lumpectomy. Lumpectomy on the L in 1997and the R in 2012. Radiation Bil.  New discovery of metastatic breat and is trying to find a treatment she can tolerate    Patient Stated Goals to get some releif  for her arm    Currently in Pain? No/denies    Pain Score 0-No pain  [facility]patient [facility]  Lymphedema Life Impact Scale Total Score 61.76 %            [facility] Adult PT Treatment/Exercise - 09/26/19 0001      Manual Therapy   Edema Management educated pt about tribute night wrap for management of UE lymphedema, pt agreeable to trying it    Manual Lymphatic Drainage (MLD) In Supine: short neck, 5 diaphragmatic breaths, Rt axillary and Lt inguinal nodes, anterior inter-axillary and Lt axillo-inguinal anastomosis, then focused on Lt UE working from lateral upper arm to dorsal hand, then retacing all steps back to pathways ( avoided fingers and wrist due to nerve pain in this area)    Passive ROM gently to left shoulder                        PT Long Term Goals - 08/31/19 1956      PT LONG TERM GOAL #1   Title Pt will report the pain in her left arm is decreased by 50%    Time 8    Period Weeks    Status New      PT LONG TERM GOAL #2   Title Patient to be properly fitted with appropriate compression garment to wear on daily basis  for home management.    Time 8    Period Weeks    Status New      PT LONG TERM GOAL #3   Title Pt will report she knows how to manage her lymphedema at home    Time 8    Period Weeks    Status New      PT LONG TERM GOAL #4   Title Pt will have 90 degrees of left shoulder flexion and abduction so that she has an improved quality of  life    Time 8    Period Weeks    Status New      PT LONG TERM GOAL #5   Title Pt will improve LLIS to 30% or less within 4 weeks in order to demonstrate objective improvement in functional activitie.    Baseline 61.76 on 08/31/2019    Time 8    Period Weeks    Status New                 Plan - 09/26/19 1458    Clinical Impression Statement Pt is interested in tribute night time warp garment and plans to use [employer]. Will send rx for doctor to sign and then issue it to patient when it is return. Continued with MLD to LUE- avoided wrist and fingers due to nerve pain in the area. Pt has tried the edema wear but reports it does not work for her. She will bring it to next session.    Comorbidities Bil radiation due to Bil breast cancer and lumpectomy, 15 lymph node removal on the L    PT Frequency 2x / week    PT Duration 8 weeks    PT Treatment/Interventions Therapeutic activities;Therapeutic exercise;Iontophoresis 18m/ml Dexamethasone;Patient/family education;Manual techniques;Neuromuscular re-education    PT Next Visit Plan see if signed Rx is back, pt made more visits today through end of Sept, will use alight to get tribute night wrap once script comes back, MLD and gentle P/AROM to left shoulder. If she brought in edema wear, try in on pt to see if she would use it at home in the evenings to decrease congestion.  did pt use [employer] funds already to get cami? continue to assess for garments for home    PT [place] access code: [facility]    Recommended Other Services sent script to dr to get signed    Consulted and Agree  with Plan of Care Patient           Patient will benefit from skilled therapeutic intervention in order to improve the following deficits and impairments:  Decreased range of motion, Pain, Increased edema  Visit Diagnosis: Lymphedema, not elsewhere classified  Stiffness of left shoulder, not elsewhere classified  Left shoulder pain, unspecified chronicity     Problem List Patient Active Problem List   Diagnosis Date Noted  . Genetic testing 06/21/2019  . Family history of breast cancer   . Family history of pancreatic cancer   . Family history of uterine cancer   . Family history of lymphoma   . Family history of lung cancer   . Bone metastases ([place]) 06/02/2019  . Recurrent cancer of left breast ([place]) 05/15/2019  . Pes anserine bursitis 03/31/2019  . HTN (hypertension) 02/10/2019  . Hyperglycemia 06/21/2018  . Left carpal tunnel syndrome 02/22/2018  . Rotator cuff arthropathy of left shoulder 09/20/2017  . Cervical radiculitis 04/09/2017  . Left shoulder pain 04/09/2017  . Dyspnea on exertion 05/14/2016  . 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 Lymphedema of left arm 01/14/2016  . Left arm swelling 12/25/2015  . SVT (supraventricular tachycardia) ([place])   . Left-sided carotid artery disease ([place]) 03/08/2015  . Dizziness 01/24/2015  . Urinary frequency 01/13/2015  . Dizziness and giddiness 01/09/2015  . Gallstones 02/21/2013  . Malignant neoplasm of upper-inner quadrant of left breast in female, estrogen receptor positive ([place]) 11/28/2012  . Muscle cramping 09/12/2012  . Right hip pain 09/12/2012  . Lower back pain 09/12/2012  . COPD GOLD I    . Hx of radiation therapy   . Right shoulder pain 09/14/2011  . Preventative health care 07/29/2010  . Skin lesion of left leg 07/29/2010  . Paresthesia 07/29/2010  . GERD 03/28/2010  . CONSTIPATION 03/28/2010  . Osteoarthrosis, 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  . Anxiety state 05/03/2009  . Hyperlipidemia  06/14/2007  . FATIGUE 06/14/2007  . 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 DIVERTICULOSIS, COLON 12/17/2006  . Cough 12/17/2006  . IRRITABLE BOWEL SYNDROME, HX OF 12/17/2006    Allyson Sabal [facility] 09/26/2019, 3:02 PM  [place], [place], [address] Phone: [phone]   Fax:  [phone]  Name: RHEANNA SERGENT MRN: [id number] Date of Birth: January 23, 1941  Manus Gunning, PT 09/26/19 3:03 PM

## 2019-09-27 ENCOUNTER — Ambulatory Visit: Payer: Medicare Other | Admitting: Physical Therapy

## 2019-09-27 DIAGNOSIS — M25612 Stiffness of left shoulder, not elsewhere classified: Secondary | ICD-10-CM

## 2019-09-27 DIAGNOSIS — M25512 Pain in left shoulder: Secondary | ICD-10-CM | POA: Diagnosis not present

## 2019-09-27 DIAGNOSIS — I890 Lymphedema, not elsewhere classified: Secondary | ICD-10-CM

## 2019-09-27 DIAGNOSIS — Z86000 Personal history of in-situ neoplasm of breast: Secondary | ICD-10-CM | POA: Diagnosis not present

## 2019-09-27 NOTE — Therapy (Signed)
[place], [place], [address] Phone: [phone]   Fax:  [phone]  Physical Therapy Treatment  Patient Details  Name: Stacey [name] MRN: [id number] Date of Birth: 1941-01-18 Referring Provider (PT): Lurline Del MD   Encounter Date: 09/27/2019   PT End of Session - 09/27/19 1605    Visit Number 5    Number of Visits 17    Date for PT Re-Evaluation 11/01/19    PT Start Time 1500    PT Stop Time 1545    PT Time Calculation (min) 45 min    Activity Tolerance Patient tolerated treatment well    Behavior During Therapy [facility] for tasks assessed/performed           Past Medical History:  Diagnosis Date  . ANEMIA-NOS   . ANXIETY   . Breast cancer ([place]) 1997 L, 2012 R   s/p chemo/xrt  . COPD    resolved  . DIVERTICULOSIS, COLON 2008  . Dizziness   . Family history of breast cancer   . Family history of lung cancer   . Family history of lymphoma   . Family history of pancreatic cancer   . Family history of uterine cancer   . GERD   . Hx of radiation therapy 10/19/11 -12/03/11   right breast  . HYPERLIPIDEMIA   . IRRITABLE BOWEL SYNDROME, HX OF   . Left-sided carotid artery disease (HCC)    moderate left ICA stenosis  . OSTEOARTHRITIS, HAND   . PSVT (paroxysmal supraventricular tachycardia) (HCC)    symptomatic on event monitor    Past Surgical History:  Procedure Laterality Date  . ABDOMINAL HYSTERECTOMY    . APPENDECTOMY    . BREAST BIOPSY  11/14/10    r breast: inv, insitu mammary carcinoma w/calcif, er/pr +, her2 -  . BREAST SURGERY     lumpectomy  . CATARACT EXTRACTION     both eyes  . ELECTROPHYSIOLOGIC STUDY N/A 05/10/2015   Procedure: SVT Ablation;  Surgeon: Will Meredith Leeds, MD;  Location: [facility];  Service: Cardiovascular;  Laterality: N/A;  . HERNIA REPAIR    . inguinal herniorrhapy  1984   left  . IR US GUIDE BX ASP/DRAIN  05/26/2019  . rectal fissure  repair    . s/p benign breast biopsy  2003   right  . s/p left foot surgury  2009  . 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 spiral fx left foot  2008   no surgury  . TMJ ARTHROPLASTY  1989  . [place]- to remove scar tissue growth   . TONSILLECTOMY      There were no vitals filed for this visit.   Subjective Assessment - 09/27/19 1559    Subjective Pt wants to get the Festus Aloe She scheduled more appointments because she says her arm always feels better after the manual lymph drainage She knows she may not get it reduced but at least the pain is better    Pertinent History Hx breast cancer with 15 lymph node removal on the L and Bil lumpectomy. Lumpectomy on the L in 1997and the R in 2012. Radiation Bil.  New discovery of metastatic breat and is trying to find a treatment she can tolerate    Patient Stated Goals to get some releif  for her arm    Currently in Pain? No/denies  [facility]patient [facility] from [facility]  Lymphedema Life Impact Scale Total Score 61.76 %            [facility] Adult PT Treatment/Exercise - 09/27/19 0001      Manual Therapy   Edema Management gave pt information about [employer] [place] and signed [employer] form     Manual Lymphatic Drainage (MLD) In Supine: short neck, 5 diaphragmatic breaths, Rt axillary and Lt inguinal nodes, anterior inter-axillary and Lt axillo-inguinal anastomosis, then focused on Lt UE working from lateral upper arm to dorsal hand, then retacing all steps back to pathways ( avoided fingers and wrist due to nerve pain in this area)    Passive ROM gently to left shoulder                   PT Education - 09/27/19 1605    Education Details how to get a Festus Aloe from [place]    Person(s) Educated Patient               PT Long Term Goals - 08/31/19 1956      PT [place] #1   Title Pt will report the pain  in her left arm is decreased by 50%    Time 8    Period Weeks    Status New      PT LONG TERM GOAL #2   Title Patient to be properly fitted with appropriate compression garment to wear on daily basis for home management.    Time 8    Period Weeks    Status New      PT LONG TERM GOAL #3   Title Pt will report she knows how to manage her lymphedema at home    Time 8    Period Weeks    Status New      PT LONG TERM GOAL #4   Title Pt will have 90 degrees of left shoulder flexion and abduction so that she has an improved quality of  life    Time 8    Period Weeks    Status New      PT LONG TERM GOAL #5   Title Pt will improve LLIS to 30% or less within 4 weeks in order to demonstrate objective improvement in functional activitie.    Baseline 61.76 on 08/31/2019    Time 8    Period Weeks    Status New                 Plan - 09/27/19 1606    Clinical Impression Statement Pt has some palpable decongestion  with MLD and she reports her arm feels better.  Tg soft reapplied.  Pt will call to get a tribute wrap with [employer] and knows it is a one time Pharmacologist Factors and Comorbidities Comorbidity 2    Comorbidities Bil radiation due to Bil breast cancer and lumpectomy, 15 lymph node removal on the L    Stability/Clinical Decision Making Stable/Uncomplicated    PT Frequency 2x / week    PT Duration 8 weeks    PT Treatment/Interventions Therapeutic activities;Therapeutic exercise;Iontophoresis 26m/ml Dexamethasone;Patient/family education;Manual techniques;Neuromuscular re-education    PT Next Visit Plan MLD and gentle P/AROM to left shoulder. check Tribute wrap once it arrives    PT [email or web address] code: [email or web address]   Consulted and Agree with Plan of Care Patient  Patient will benefit from skilled therapeutic intervention in order to improve the following deficits and impairments:  Decreased range of motion, Pain, Increased  edema  Visit Diagnosis: Lymphedema, not elsewhere classified  Stiffness of left shoulder, not elsewhere classified  Left shoulder pain, unspecified chronicity     Problem List Patient Active Problem List   Diagnosis Date Noted  . Genetic testing 06/21/2019  . Family history of breast cancer   . Family history of pancreatic cancer   . Family history of uterine cancer   . Family history of lymphoma   . Family history of lung cancer   . Bone metastases ([place]) 06/02/2019  . Recurrent cancer of left breast ([place]) 05/15/2019  . Pes anserine bursitis 03/31/2019  . HTN (hypertension) 02/10/2019  . Hyperglycemia 06/21/2018  . Left carpal tunnel syndrome 02/22/2018  . Rotator cuff arthropathy of left shoulder 09/20/2017  . Cervical radiculitis 04/09/2017  . Left shoulder pain 04/09/2017  . Dyspnea on exertion 05/14/2016  . 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 Lymphedema of left arm 01/14/2016  . Left arm swelling 12/25/2015  . SVT (supraventricular tachycardia) ([place])   . Left-sided carotid artery disease ([place]) 03/08/2015  . Dizziness 01/24/2015  . Urinary frequency 01/13/2015  . Dizziness and giddiness 01/09/2015  . Gallstones 02/21/2013  . Malignant neoplasm of upper-inner quadrant of left breast in female, estrogen receptor positive ([place]) 11/28/2012  . Muscle cramping 09/12/2012  . Right hip pain 09/12/2012  . Lower back pain 09/12/2012  . COPD GOLD I    . Hx of radiation therapy   . Right shoulder pain 09/14/2011  . Preventative health care 07/29/2010  . Skin lesion of left leg 07/29/2010  . Paresthesia 07/29/2010  . GERD 03/28/2010  . CONSTIPATION 03/28/2010  . Osteoarthrosis, 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  . Anxiety state 05/03/2009  . Hyperlipidemia 06/14/2007  . FATIGUE 06/14/2007  . 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 DIVERTICULOSIS, COLON 12/17/2006  . Cough 12/17/2006  . IRRITABLE BOWEL SYNDROME, HX OF 12/17/2006   Stacey [name]  PT  Stacey [name] 09/27/2019, 4:09 PM  [place], [place], [address] Phone: [phone]   Fax:  [phone]  Name: Stacey [name] MRN: [id number] Date of Birth: 1940-02-25

## 2019-09-27 NOTE — Patient Instructions (Signed)
First of all, check with your insurance company to see if provider is in network    [facility] (for wigs and compression sleeves / gloves/gauntlets )  [address] [place], [place] [address] [phone]  Will file some insurances --- call for appointment   [facility] (for mastectomy prosthetics and garments) [address] [place], [place] [address] [phone] Will file some insurances --- call for appointment  [facility]  [address]  , [place] [address] [phone] Lower extremity garments  [facility] [address] [place], [place]  [address] [phone]  Cathy Rubel ( Medicaid certified lymphedema fitter) [phone] [email or web address]  Melissa Meares  [facility]  [phone]  [facility] [address] [place], [place] [address] [phone]  Other Resources: National Lymphedema Network:  www.lymphnet.org www.Klosetraining.com for patient articles and self manual lymph drainage information www.lymphedemablog.com has informative articles.  www.compressionguru.com www.lymphedemaproducts.com www.brightlifedirect.com www.compressionguru.com 

## 2019-09-28 ENCOUNTER — Inpatient Hospital Stay: Payer: Medicare Other

## 2019-09-28 ENCOUNTER — Other Ambulatory Visit: Payer: Self-pay

## 2019-09-28 VITALS — BP 150/66 | HR 79 | Resp 18

## 2019-09-28 DIAGNOSIS — C50911 Malignant neoplasm of unspecified site of right female breast: Secondary | ICD-10-CM

## 2019-09-28 DIAGNOSIS — C7989 Secondary malignant neoplasm of other specified sites: Secondary | ICD-10-CM | POA: Diagnosis not present

## 2019-09-28 DIAGNOSIS — C50211 Malignant neoplasm of upper-inner quadrant of right female breast: Secondary | ICD-10-CM | POA: Diagnosis not present

## 2019-09-28 DIAGNOSIS — M8580 Other specified disorders of bone density and structure, unspecified site: Secondary | ICD-10-CM | POA: Diagnosis not present

## 2019-09-28 DIAGNOSIS — C7951 Secondary malignant neoplasm of bone: Secondary | ICD-10-CM | POA: Diagnosis not present

## 2019-09-28 DIAGNOSIS — Z79810 Long term (current) use of selective estrogen receptor modulators (SERMs): Secondary | ICD-10-CM | POA: Diagnosis not present

## 2019-09-28 DIAGNOSIS — Z170 Estrogen receptor positive status [ER+]: Secondary | ICD-10-CM

## 2019-09-28 DIAGNOSIS — R9389 Abnormal findings on diagnostic imaging of other specified body structures: Secondary | ICD-10-CM

## 2019-09-28 LAB — CBC WITH DIFFERENTIAL/PLATELET
Abs Immature Granulocytes: 0.02 10*3/uL (ref 0.00–0.07)
Basophils Absolute: 0 10*3/uL (ref 0.0–0.1)
Basophils Relative: 1 %
Eosinophils Absolute: 0.1 10*3/uL (ref 0.0–0.5)
Eosinophils Relative: 1 %
HCT: 30.9 % — ABNORMAL LOW (ref 36.0–46.0)
Hemoglobin: 10.7 g/dL — ABNORMAL LOW (ref 12.0–15.0)
Immature Granulocytes: 1 %
Lymphocytes Relative: 24 %
Lymphs Abs: 0.9 10*3/uL (ref 0.7–4.0)
MCH: 32.6 pg (ref 26.0–34.0)
MCHC: 34.6 g/dL (ref 30.0–36.0)
MCV: 94.2 fL (ref 80.0–100.0)
Monocytes Absolute: 0.3 10*3/uL (ref 0.1–1.0)
Monocytes Relative: 8 %
Neutro Abs: 2.4 10*3/uL (ref 1.7–7.7)
Neutrophils Relative %: 65 %
Platelets: 266 10*3/uL (ref 150–400)
RBC: 3.28 MIL/uL — ABNORMAL LOW (ref 3.87–5.11)
RDW: 18.2 % — ABNORMAL HIGH (ref 11.5–15.5)
WBC: 3.7 10*3/uL — ABNORMAL LOW (ref 4.0–10.5)
nRBC: 0 % (ref 0.0–0.2)

## 2019-09-28 LAB — COMPREHENSIVE METABOLIC PANEL
ALT: 11 U/L (ref 0–44)
AST: 20 U/L (ref 15–41)
Albumin: 3.6 g/dL (ref 3.5–5.0)
Alkaline Phosphatase: 62 U/L (ref 38–126)
Anion gap: 8 (ref 5–15)
BUN: 14 mg/dL (ref 8–23)
CO2: 24 mmol/L (ref 22–32)
Calcium: 8.8 mg/dL — ABNORMAL LOW (ref 8.9–10.3)
Chloride: 109 mmol/L (ref 98–111)
Creatinine, Ser: 1.05 mg/dL — ABNORMAL HIGH (ref 0.44–1.00)
GFR calc Af Amer: 58 mL/min — ABNORMAL LOW (ref >60)
GFR calc non Af Amer: 50 mL/min — ABNORMAL LOW (ref >60)
Glucose, Bld: 105 mg/dL — ABNORMAL HIGH (ref 70–99)
Potassium: 3.5 mmol/L (ref 3.5–5.1)
Sodium: 141 mmol/L (ref 135–145)
Total Bilirubin: 0.4 mg/dL (ref 0.3–1.2)
Total Protein: 7 g/dL (ref 6.5–8.1)

## 2019-09-28 MED ORDER — DENOSUMAB 120 MG/1.7ML ~~LOC~~ SOLN
120.0000 mg | Freq: Once | SUBCUTANEOUS | Status: AC
  Administered 2019-09-28: 120 mg via SUBCUTANEOUS

## 2019-09-28 MED ORDER — DENOSUMAB 120 MG/1.7ML ~~LOC~~ SOLN
SUBCUTANEOUS | Status: AC
  Filled 2019-09-28: qty 1.7

## 2019-09-28 NOTE — Progress Notes (Signed)
Ok to proceed with Delton See today with Ca 8.8 and Alb 3.6.  Hardie Pulley, PharmD, BCPS, BCOP

## 2019-09-28 NOTE — Patient Instructions (Signed)
Denosumab injection What is this medicine? DENOSUMAB (den oh sue mab) slows bone breakdown. Prolia is used to treat osteoporosis in women after menopause and in men, and in people who are taking corticosteroids for 6 months or more. Xgeva is used to treat a high calcium level due to cancer and to prevent bone fractures and other bone problems caused by multiple myeloma or cancer bone metastases. Xgeva is also used to treat giant cell tumor of the bone. This medicine may be used for other purposes; ask your health care provider or pharmacist if you have questions. COMMON BRAND NAME(S): Prolia, XGEVA What should I tell my health care provider before I take this medicine? They need to know if you have any of these conditions:  dental disease  having surgery or tooth extraction  infection  kidney disease  low levels of calcium or Vitamin D in the blood  malnutrition  on hemodialysis  skin conditions or sensitivity  thyroid or parathyroid disease  an unusual reaction to denosumab, other medicines, foods, dyes, or preservatives  pregnant or trying to get pregnant  breast-feeding How should I use this medicine? This medicine is for injection under the skin. It is given by a health care professional in a hospital or clinic setting. A special MedGuide will be given to you before each treatment. Be sure to read this information carefully each time. For Prolia, talk to your pediatrician regarding the use of this medicine in children. Special care may be needed. For Xgeva, talk to your pediatrician regarding the use of this medicine in children. While this drug may be prescribed for children as young as 13 years for selected conditions, precautions do apply. Overdosage: If you think you have taken too much of this medicine contact a poison control center or emergency room at once. NOTE: This medicine is only for you. Do not share this medicine with others. What if I miss a dose? It is  important not to miss your dose. Call your doctor or health care professional if you are unable to keep an appointment. What may interact with this medicine? Do not take this medicine with any of the following medications:  other medicines containing denosumab This medicine may also interact with the following medications:  medicines that lower your chance of fighting infection  steroid medicines like prednisone or cortisone This list may not describe all possible interactions. Give your health care provider a list of all the medicines, herbs, non-prescription drugs, or dietary supplements you use. Also tell them if you smoke, drink alcohol, or use illegal drugs. Some items may interact with your medicine. What should I watch for while using this medicine? Visit your doctor or health care professional for regular checks on your progress. Your doctor or health care professional may order blood tests and other tests to see how you are doing. Call your doctor or health care professional for advice if you get a fever, chills or sore throat, or other symptoms of a cold or flu. Do not treat yourself. This drug may decrease your body's ability to fight infection. Try to avoid being around people who are sick. You should make sure you get enough calcium and vitamin D while you are taking this medicine, unless your doctor tells you not to. Discuss the foods you eat and the vitamins you take with your health care professional. See your dentist regularly. Brush and floss your teeth as directed. Before you have any dental work done, tell your dentist you are   receiving this medicine. Do not become pregnant while taking this medicine or for 5 months after stopping it. Talk with your doctor or health care professional about your birth control options while taking this medicine. Women should inform their doctor if they wish to become pregnant or think they might be pregnant. There is a potential for serious side  effects to an unborn child. Talk to your health care professional or pharmacist for more information. What side effects may I notice from receiving this medicine? Side effects that you should report to your doctor or health care professional as soon as possible:  allergic reactions like skin rash, itching or hives, swelling of the face, lips, or tongue  bone pain  breathing problems  dizziness  jaw pain, especially after dental work  redness, blistering, peeling of the skin  signs and symptoms of infection like fever or chills; cough; sore throat; pain or trouble passing urine  signs of low calcium like fast heartbeat, muscle cramps or muscle pain; pain, tingling, numbness in the hands or feet; seizures  unusual bleeding or bruising  unusually weak or tired Side effects that usually do not require medical attention (report to your doctor or health care professional if they continue or are bothersome):  constipation  diarrhea  headache  joint pain  loss of appetite  muscle pain  runny nose  tiredness  upset stomach This list may not describe all possible side effects. Call your doctor for medical advice about side effects. You may report side effects to FDA at 1-800-FDA-1088. Where should I keep my medicine? This medicine is only given in a clinic, doctor's office, or other health care setting and will not be stored at home. NOTE: This sheet is a summary. It may not cover all possible information. If you have questions about this medicine, talk to your doctor, pharmacist, or health care provider.  2020 [employer] (2017-05-28 16:10:44)

## 2019-10-02 ENCOUNTER — Other Ambulatory Visit: Payer: Self-pay

## 2019-10-02 ENCOUNTER — Ambulatory Visit: Payer: Medicare Other

## 2019-10-02 DIAGNOSIS — M25612 Stiffness of left shoulder, not elsewhere classified: Secondary | ICD-10-CM | POA: Diagnosis not present

## 2019-10-02 DIAGNOSIS — M25512 Pain in left shoulder: Secondary | ICD-10-CM | POA: Diagnosis not present

## 2019-10-02 DIAGNOSIS — I890 Lymphedema, not elsewhere classified: Secondary | ICD-10-CM | POA: Diagnosis not present

## 2019-10-02 DIAGNOSIS — Z86000 Personal history of in-situ neoplasm of breast: Secondary | ICD-10-CM | POA: Diagnosis not present

## 2019-10-02 NOTE — Therapy (Signed)
[place], [place], [address] Phone: [phone]   Fax:  [phone]  Physical Therapy Treatment  Patient Details  Name: Stacey [name] MRN: [id number] Date of Birth: 1940/04/28 Referring Provider (PT): Lurline Del MD   Encounter Date: 10/02/2019   PT End of Session - 10/02/19 1406    Visit Number 6    Number of Visits 17    Date for PT Re-Evaluation 11/01/19    PT Start Time 4259    PT Stop Time 1404    PT Time Calculation (min) 61 min    Activity Tolerance Patient tolerated treatment well    Behavior During Therapy [facility] for tasks assessed/performed           Past Medical History:  Diagnosis Date  . ANEMIA-NOS   . ANXIETY   . Breast cancer ([place]) 1997 L, 2012 R   s/p chemo/xrt  . COPD    resolved  . DIVERTICULOSIS, COLON 2008  . Dizziness   . Family history of breast cancer   . Family history of lung cancer   . Family history of lymphoma   . Family history of pancreatic cancer   . Family history of uterine cancer   . GERD   . Hx of radiation therapy 10/19/11 -12/03/11   right breast  . HYPERLIPIDEMIA   . IRRITABLE BOWEL SYNDROME, HX OF   . Left-sided carotid artery disease (HCC)    moderate left ICA stenosis  . OSTEOARTHRITIS, HAND   . PSVT (paroxysmal supraventricular tachycardia) (HCC)    symptomatic on event monitor    Past Surgical History:  Procedure Laterality Date  . ABDOMINAL HYSTERECTOMY    . APPENDECTOMY    . BREAST BIOPSY  11/14/10    r breast: inv, insitu mammary carcinoma w/calcif, er/pr +, her2 -  . BREAST SURGERY     lumpectomy  . CATARACT EXTRACTION     both eyes  . ELECTROPHYSIOLOGIC STUDY N/A 05/10/2015   Procedure: SVT Ablation;  Surgeon: Will Meredith Leeds, MD;  Location: [facility];  Service: Cardiovascular;  Laterality: N/A;  . HERNIA REPAIR    . inguinal herniorrhapy  1984   left  . IR US GUIDE BX ASP/DRAIN  05/26/2019  . rectal fissure  repair    . s/p benign breast biopsy  2003   right  . s/p left foot surgury  2009  . 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 spiral fx left foot  2008   no surgury  . TMJ ARTHROPLASTY  1989  . [place]- to remove scar tissue growth   . TONSILLECTOMY      There were no vitals filed for this visit.   Subjective Assessment - 10/02/19 1307    Subjective I had a few spots removed on my chest and cheek that my doctor removed just because they were bothersome, not cancerous. I have an appointment to get measured for the Festus Aloe on Wednesday.    Pertinent History Hx breast cancer with 15 lymph node removal on the L and Bil lumpectomy. Lumpectomy on the L in 1997and the R in 2012. Radiation Bil.  New discovery of metastatic breat and is trying to find a treatment she can tolerate    Patient Stated Goals to get some releif  for her arm    Currently in Pain? No/denies  [facility]patient [facility]  Lymphedema Life Impact Scale Total Score 61.76 %            [facility] Adult PT Treatment/Exercise - 10/02/19 0001      Manual Therapy   Manual Lymphatic Drainage (MLD) In Supine: short neck, 5 diaphragmatic breaths, Rt axillary and Lt inguinal nodes, anterior inter-axillary and Lt axillo-inguinal anastomosis, then focused on Lt UE working from lateral upper arm to dorsal hand, then retacing all steps back to pathways, no wrist nerve pain today    Passive ROM gently to left shoulder into flexion, er and minimally into abduction as this was limited by pain and pt had trouble relaxing                        PT Long Term Goals - 08/31/19 1956      PT LONG TERM GOAL #1   Title Pt will report the pain in her left arm is decreased by 50%    Time 8    Period Weeks    Status New      PT LONG TERM GOAL #2   Title Patient to be properly fitted with appropriate compression  garment to wear on daily basis for home management.    Time 8    Period Weeks    Status New      PT LONG TERM GOAL #3   Title Pt will report she knows how to manage her lymphedema at home    Time 8    Period Weeks    Status New      PT LONG TERM GOAL #4   Title Pt will have 90 degrees of left shoulder flexion and abduction so that she has an improved quality of  life    Time 8    Period Weeks    Status New      PT LONG TERM GOAL #5   Title Pt will improve LLIS to 30% or less within 4 weeks in order to demonstrate objective improvement in functional activitie.    Baseline 61.76 on 08/31/2019    Time 8    Period Weeks    Status New                 Plan - 10/02/19 1406    Clinical Impression Statement Pt has appt at [facility] to gt measured for Festus Aloe on Wednesday. Continued with MLD to Lt UE with very gentle P/ROM of Lt shoulder as she was able to tolerate.    Personal Factors and Comorbidities Comorbidity 2    Comorbidities Bil radiation due to Bil breast cancer and lumpectomy, 15 lymph node removal on the L    Stability/Clinical Decision Making Stable/Uncomplicated    Rehab Potential Good    PT Frequency 2x / week    PT Duration 8 weeks    PT Treatment/Interventions Therapeutic activities;Therapeutic exercise;Iontophoresis 19m/ml Dexamethasone;Patient/family education;Manual techniques;Neuromuscular re-education    PT Next Visit Plan MLD and gentle P/AROM to left shoulder. check Tribute wrap once it arrives    [employer] Agree with Plan of Care Patient           Patient will benefit from skilled therapeutic intervention in order to improve the following deficits and impairments:  Decreased range of motion, Pain, Increased edema  Visit Diagnosis: Lymphedema, not elsewhere classified  Stiffness of left shoulder, not elsewhere classified  Left shoulder pain, unspecified chronicity  Problem List Patient Active Problem List   Diagnosis Date  Noted  . Genetic testing 06/21/2019  . Family history of breast cancer   . Family history of pancreatic cancer   . Family history of uterine cancer   . Family history of lymphoma   . Family history of lung cancer   . Bone metastases ([place]) 06/02/2019  . Recurrent cancer of left breast ([place]) 05/15/2019  . Pes anserine bursitis 03/31/2019  . HTN (hypertension) 02/10/2019  . Hyperglycemia 06/21/2018  . Left carpal tunnel syndrome 02/22/2018  . Rotator cuff arthropathy of left shoulder 09/20/2017  . Cervical radiculitis 04/09/2017  . Left shoulder pain 04/09/2017  . Dyspnea on exertion 05/14/2016  . 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 Lymphedema of left arm 01/14/2016  . Left arm swelling 12/25/2015  . SVT (supraventricular tachycardia) ([place])   . Left-sided carotid artery disease ([place]) 03/08/2015  . Dizziness 01/24/2015  . Urinary frequency 01/13/2015  . Dizziness and giddiness 01/09/2015  . Gallstones 02/21/2013  . Malignant neoplasm of upper-inner quadrant of left breast in female, estrogen receptor positive ([place]) 11/28/2012  . Muscle cramping 09/12/2012  . Right hip pain 09/12/2012  . Lower back pain 09/12/2012  . COPD GOLD I    . Hx of radiation therapy   . Right shoulder pain 09/14/2011  . Preventative health care 07/29/2010  . Skin lesion of left leg 07/29/2010  . Paresthesia 07/29/2010  . GERD 03/28/2010  . CONSTIPATION 03/28/2010  . Osteoarthrosis, 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  . Anxiety state 05/03/2009  . Hyperlipidemia 06/14/2007  . FATIGUE 06/14/2007  . 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 DIVERTICULOSIS, COLON 12/17/2006  . Cough 12/17/2006  . IRRITABLE BOWEL SYNDROME, HX OF 12/17/2006    Otelia Limes, PTA 10/02/2019, 5:30 PM  [place], [place], [address] Phone: [phone]   Fax:  [phone]  Name: NASIYA PASCUAL MRN: [id number] Date of  Birth: 19-Nov-1940

## 2019-10-04 ENCOUNTER — Ambulatory Visit: Payer: Medicare Other | Attending: Oncology | Admitting: Physical Therapy

## 2019-10-04 ENCOUNTER — Encounter: Payer: Self-pay | Admitting: Physical Therapy

## 2019-10-04 ENCOUNTER — Other Ambulatory Visit: Payer: Self-pay

## 2019-10-04 DIAGNOSIS — M25512 Pain in left shoulder: Secondary | ICD-10-CM | POA: Insufficient documentation

## 2019-10-04 DIAGNOSIS — I890 Lymphedema, not elsewhere classified: Secondary | ICD-10-CM | POA: Diagnosis not present

## 2019-10-04 DIAGNOSIS — M25612 Stiffness of left shoulder, not elsewhere classified: Secondary | ICD-10-CM | POA: Diagnosis not present

## 2019-10-04 NOTE — Therapy (Signed)
[place], [place], [address] Phone: [phone]   Fax:  [phone]  Physical Therapy Treatment  Patient Details  Name: Stacey [name] MRN: [id number] Date of Birth: [birth year] Referring Provider (PT): Lurline Del MD   Encounter Date: 10/04/2019   PT End of Session - 10/04/19 1356    Visit Number 7    Number of Visits 17    Date for PT Re-Evaluation 11/01/19    PT Start Time 1300    PT Stop Time 8502    PT Time Calculation (min) 45 min    Activity Tolerance Patient tolerated treatment well    Behavior During Therapy [facility] for tasks assessed/performed           Past Medical History:  Diagnosis Date   ANEMIA-NOS    ANXIETY    Breast cancer ([place]) 1997 L, 2012 R   s/p chemo/xrt   COPD    resolved   DIVERTICULOSIS, COLON 2008   Dizziness    Family history of breast cancer    Family history of lung cancer    Family history of lymphoma    Family history of pancreatic cancer    Family history of uterine cancer    GERD    Hx of radiation therapy 10/19/11 -12/03/11   right breast   HYPERLIPIDEMIA    IRRITABLE BOWEL SYNDROME, HX OF    Left-sided carotid artery disease ([place])    moderate left ICA stenosis   OSTEOARTHRITIS, HAND    PSVT (paroxysmal supraventricular tachycardia) ([place])    symptomatic on event monitor    Past Surgical History:  Procedure Laterality Date   ABDOMINAL HYSTERECTOMY     APPENDECTOMY     BREAST BIOPSY  11/14/10    r breast: inv, insitu mammary carcinoma w/calcif, er/pr +, her2 -   BREAST SURGERY     lumpectomy   CATARACT EXTRACTION     both eyes   ELECTROPHYSIOLOGIC STUDY N/A 05/10/2015   Procedure: SVT Ablation;  Surgeon: Will Meredith Leeds, MD;  Location: [facility];  Service: Cardiovascular;  Laterality: N/A;   HERNIA REPAIR     inguinal herniorrhapy  1984   left   IR US GUIDE BX ASP/DRAIN  05/26/2019   rectal fissure  repair     s/p benign breast biopsy  2003   right   s/p left foot surgury  2009   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   spiral fx left foot  2008   no surgury   TMJ ARTHROPLASTY  1989   TONGUE SURGERY     1988- to remove scar tissue growth    TONSILLECTOMY      There were no vitals filed for this visit.   Subjective Assessment - 10/04/19 1353    Subjective Pt states she is going to get measured for her Festus Aloe after this appointment.    Pertinent History Hx breast cancer with 15 lymph node removal on the L and Bil lumpectomy. Lumpectomy on the L in 1997and the R in 2012. Radiation Bil.  New discovery of metastatic breat and is trying to find a treatment she can tolerate    Patient Stated Goals to get some relief  for her arm    Currently in Pain? No/denies                       Outpatient Rehab from 08/31/2019 in [facility]  Lymphedema Life Impact Scale Total Score 61.76 %            [facility] Adult PT Treatment/Exercise - 10/04/19 0001      Manual Therapy   Manual Lymphatic Drainage (MLD) In Supine: short neck, 5 diaphragmatic breaths, Rt axillary and Lt inguinal nodes, anterior inter-axillary and Lt axillo-inguinal anastomosis, then focused on Lt UE working from lateral upper arm to dorsal hand, then retacing all steps back to pathways, no wrist nerve pain today    Passive ROM gently to left shoulder into flexion, er , also gentle PROM to elbow ,hand, fingers and thumb.  Pt with difficulty with thumb and index finger., encouraged pt to do 5th finger and thumb opposition                        PT Long Term Goals - 08/31/19 1956      PT LONG TERM GOAL #1   Title Pt will report the pain in her left arm is decreased by 50%    Time 8    Period Weeks    Status New      PT LONG TERM GOAL #2   Title Patient to be properly fitted with appropriate compression garment to wear on daily basis for home  management.    Time 8    Period Weeks    Status New      PT LONG TERM GOAL #3   Title Pt will report she knows how to manage her lymphedema at home    Time 8    Period Weeks    Status New      PT LONG TERM GOAL #4   Title Pt will have 90 degrees of left shoulder flexion and abduction so that she has an improved quality of  life    Time 8    Period Weeks    Status New      PT LONG TERM GOAL #5   Title Pt will improve LLIS to 30% or less within 4 weeks in order to demonstrate objective improvement in functional activitie.    Baseline 61.76 on 08/31/2019    Time 8    Period Weeks    Status New                 Plan - 10/04/19 1357    Clinical Impression Statement Pt continues to have lymphedema and decreased ROM and strenth in left shoulder. She gets some relief from manual treatment and feels looser after having PROM exercise    Personal Factors and Comorbidities Comorbidity 2    Comorbidities Bil radiation due to Bil breast cancer and lumpectomy, 15 lymph node removal on the L    Stability/Clinical Decision Making Stable/Uncomplicated    Rehab Potential Good    PT Frequency 2x / week    PT Duration 8 weeks    PT Next Visit Plan MLD and gentle P/AROM to left shoulder. check Tribute wrap once it arrives    PT Home Exercise Plan Medbrideg access code: [email or web address]    Consulted and Agree with Plan of Care Patient           Patient will benefit from skilled therapeutic intervention in order to improve the following deficits and impairments:  Decreased range of motion, Pain, Increased edema  Visit Diagnosis: Lymphedema, not elsewhere classified  Stiffness of left shoulder, not elsewhere classified  Left shoulder pain, unspecified chronicity     Problem List Patient Active Problem  List   Diagnosis Date Noted   Genetic testing 06/21/2019   Family history of breast cancer    Family history of pancreatic cancer    Family history of uterine cancer    Family  history of lymphoma    Family history of lung cancer    Bone metastases ([place]) 06/02/2019   Recurrent cancer of left breast ([place]) 05/15/2019   Pes anserine bursitis 03/31/2019   HTN (hypertension) 02/10/2019   Hyperglycemia 06/21/2018   Left carpal tunnel syndrome 02/22/2018   Rotator cuff arthropathy of left shoulder 09/20/2017   Cervical radiculitis 04/09/2017   Left shoulder pain 04/09/2017   Dyspnea on exertion 05/14/2016   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   Lymphedema of left arm 01/14/2016   Left arm swelling 12/25/2015   SVT (supraventricular tachycardia) ([place])    Left-sided carotid artery disease ([place]) 03/08/2015   Dizziness 01/24/2015   Urinary frequency 01/13/2015   Dizziness and giddiness 01/09/2015   Gallstones 02/21/2013   Malignant neoplasm of upper-inner quadrant of left breast in female, estrogen receptor positive ([place]) 11/28/2012   Muscle cramping 09/12/2012   Right hip pain 09/12/2012   Lower back pain 09/12/2012   COPD GOLD I     Hx of radiation therapy    Right shoulder pain 09/14/2011   Preventative health care 07/29/2010   Skin lesion of left leg 07/29/2010   Paresthesia 07/29/2010   GERD 03/28/2010   CONSTIPATION 03/28/2010   Osteoarthrosis, 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   Anxiety state 05/03/2009   Hyperlipidemia 06/14/2007   FATIGUE 06/14/2007   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 Owens Shark PT  Norwood Levo 10/04/2019, 1:58 PM  [place], [place], [address] Phone: [phone]   Fax:  [phone]  Name: BAYAN HEDSTROM MRN: [id number] Date of Birth: Jun 08, 1940

## 2019-10-17 ENCOUNTER — Other Ambulatory Visit: Payer: Self-pay

## 2019-10-17 ENCOUNTER — Ambulatory Visit: Payer: Medicare Other | Admitting: Physical Therapy

## 2019-10-17 ENCOUNTER — Encounter: Payer: Self-pay | Admitting: Physical Therapy

## 2019-10-17 DIAGNOSIS — I890 Lymphedema, not elsewhere classified: Secondary | ICD-10-CM

## 2019-10-17 DIAGNOSIS — M25512 Pain in left shoulder: Secondary | ICD-10-CM

## 2019-10-17 DIAGNOSIS — M25612 Stiffness of left shoulder, not elsewhere classified: Secondary | ICD-10-CM | POA: Diagnosis not present

## 2019-10-17 NOTE — Therapy (Signed)
[place], [place], [address] Phone: [phone]   Fax:  [phone]  Physical Therapy Treatment  Patient Details  Name: Stacey [name] MRN: [id number] Date of Birth: 06-17-1940 Referring Provider (PT): Lurline Del MD   Encounter Date: 10/17/2019   PT End of Session - 10/17/19 1503    Visit Number 8    Number of Visits 17    Date for PT Re-Evaluation 11/01/19    PT Start Time 3704    PT Stop Time 1500    PT Time Calculation (min) 57 min    Activity Tolerance Patient tolerated treatment well    Behavior During Therapy [facility] for tasks assessed/performed           Past Medical History:  Diagnosis Date  . ANEMIA-NOS   . ANXIETY   . Breast cancer ([place]) 1997 L, 2012 R   s/p chemo/xrt  . COPD    resolved  . DIVERTICULOSIS, COLON 2008  . Dizziness   . Family history of breast cancer   . Family history of lung cancer   . Family history of lymphoma   . Family history of pancreatic cancer   . Family history of uterine cancer   . GERD   . Hx of radiation therapy 10/19/11 -12/03/11   right breast  . HYPERLIPIDEMIA   . IRRITABLE BOWEL SYNDROME, HX OF   . Left-sided carotid artery disease (HCC)    moderate left ICA stenosis  . OSTEOARTHRITIS, HAND   . PSVT (paroxysmal supraventricular tachycardia) (HCC)    symptomatic on event monitor    Past Surgical History:  Procedure Laterality Date  . ABDOMINAL HYSTERECTOMY    . APPENDECTOMY    . BREAST BIOPSY  11/14/10    r breast: inv, insitu mammary carcinoma w/calcif, er/pr +, her2 -  . BREAST SURGERY     lumpectomy  . CATARACT EXTRACTION     both eyes  . ELECTROPHYSIOLOGIC STUDY N/A 05/10/2015   Procedure: SVT Ablation;  Surgeon: Will Meredith Leeds, MD;  Location: [facility];  Service: Cardiovascular;  Laterality: N/A;  . HERNIA REPAIR    . inguinal herniorrhapy  1984   left  . IR US GUIDE BX ASP/DRAIN  05/26/2019  . rectal fissure  repair    . s/p benign breast biopsy  2003   right  . s/p left foot surgury  2009  . 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 spiral fx left foot  2008   no surgury  . TMJ ARTHROPLASTY  1989  . [place]- to remove scar tissue growth   . TONSILLECTOMY      There were no vitals filed for this visit.   Subjective Assessment - 10/17/19 1406    Subjective I got measured for the wrap. I have not gotten back in touch with her yet. I plan on doing that Thursday after I leave here. I have to get fitted for it to make sure it fits.    Pertinent History Hx breast cancer with 15 lymph node removal on the L and Bil lumpectomy. Lumpectomy on the L in 1997and the R in 2012. Radiation Bil.  New discovery of metastatic breat and is trying to find a treatment she can tolerate    Patient Stated Goals to get some relief  for her arm    Currently in Pain? No/denies    Pain Score 0-No pain  [facility]  Lymphedema Life Impact Scale Total Score 61.76 %            [facility] Adult PT Treatment/Exercise - 10/17/19 0001      Manual Therapy   Manual Lymphatic Drainage (MLD) In Supine: short neck, 5 diaphragmatic breaths, Rt axillary and Lt inguinal nodes, anterior inter-axillary and Lt axillo-inguinal anastomosis, then focused on Lt UE working from lateral upper arm to dorsal hand, then retacing all steps back to pathways, no wrist nerve pain today    Passive ROM gently to left shoulder into flexion, and abduction and PROM to all digits - avoided wrist due to wrist pain                       PT Long Term Goals - 08/31/19 1956      PT LONG TERM GOAL #1   Title Pt will report the pain in her left arm is decreased by 50%    Time 8    Period Weeks    Status New      PT LONG TERM GOAL #2   Title Patient to be properly fitted with appropriate compression garment to wear  on daily basis for home management.    Time 8    Period Weeks    Status New      PT LONG TERM GOAL #3   Title Pt will report she knows how to manage her lymphedema at home    Time 8    Period Weeks    Status New      PT LONG TERM GOAL #4   Title Pt will have 90 degrees of left shoulder flexion and abduction so that she has an improved quality of  life    Time 8    Period Weeks    Status New      PT LONG TERM GOAL #5   Title Pt will improve LLIS to 30% or less within 4 weeks in order to demonstrate objective improvement in functional activitie.    Baseline 61.76 on 08/31/2019    Time 8    Period Weeks    Status New                 Plan - 10/17/19 1504    Clinical Impression Statement Continued with MLD to LUE. Pt is planning on getting her garment fitted on Thursday after her appointment. Performed PROM to L shoulder and fingers but avoided wrist since pt reported increased wrist pain after last session. Her arm fibrosis softens with MLD.    PT Frequency 2x / week    PT Duration 8 weeks    PT Treatment/Interventions Therapeutic activities;Therapeutic exercise;Iontophoresis 71m/ml Dexamethasone;Patient/family education;Manual techniques;Neuromuscular re-education    PT Next Visit Plan MLD and gentle P/AROM to left shoulder. check Tribute wrap once it arrives    PT Home Exercise Plan Medbrideg access code: [email or web address]   Consulted and Agree with Plan of Care Patient           Patient will benefit from skilled therapeutic intervention in order to improve the following deficits and impairments:  Decreased range of motion, Pain, Increased edema  Visit Diagnosis: Lymphedema, not elsewhere classified  Stiffness of left shoulder, not elsewhere classified  Left shoulder pain, unspecified chronicity     Problem List Patient Active Problem List   Diagnosis Date Noted  . Genetic testing 06/21/2019  . Family history of breast cancer   .  Family history of pancreatic cancer    . Family history of uterine cancer   . Family history of lymphoma   . Family history of lung cancer   . Bone metastases ([place]) 06/02/2019  . Recurrent cancer of left breast ([place]) 05/15/2019  . Pes anserine bursitis 03/31/2019  . HTN (hypertension) 02/10/2019  . Hyperglycemia 06/21/2018  . Left carpal tunnel syndrome 02/22/2018  . Rotator cuff arthropathy of left shoulder 09/20/2017  . Cervical radiculitis 04/09/2017  . Left shoulder pain 04/09/2017  . Dyspnea on exertion 05/14/2016  . 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 Lymphedema of left arm 01/14/2016  . Left arm swelling 12/25/2015  . SVT (supraventricular tachycardia) ([place])   . Left-sided carotid artery disease ([place]) 03/08/2015  . Dizziness 01/24/2015  . Urinary frequency 01/13/2015  . Dizziness and giddiness 01/09/2015  . Gallstones 02/21/2013  . Malignant neoplasm of upper-inner quadrant of left breast in female, estrogen receptor positive ([place]) 11/28/2012  . Muscle cramping 09/12/2012  . Right hip pain 09/12/2012  . Lower back pain 09/12/2012  . COPD GOLD I    . Hx of radiation therapy   . Right shoulder pain 09/14/2011  . Preventative health care 07/29/2010  . Skin lesion of left leg 07/29/2010  . Paresthesia 07/29/2010  . GERD 03/28/2010  . CONSTIPATION 03/28/2010  . Osteoarthrosis, 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  . Anxiety state 05/03/2009  . Hyperlipidemia 06/14/2007  . FATIGUE 06/14/2007  . 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 DIVERTICULOSIS, COLON 12/17/2006  . Cough 12/17/2006  . IRRITABLE BOWEL SYNDROME, HX OF 12/17/2006    Allyson Sabal [facility] 10/17/2019, 3:05 PM  [place], [place], [address] Phone: [phone]   Fax:  [phone]  Name: Stacey [name] MRN: [id number] Date of Birth: 1941/01/15  Manus Gunning, PT 10/17/19 3:05 PM

## 2019-10-18 ENCOUNTER — Inpatient Hospital Stay: Payer: Medicare Other | Attending: Adult Health

## 2019-10-18 ENCOUNTER — Other Ambulatory Visit: Payer: Self-pay

## 2019-10-18 DIAGNOSIS — C50912 Malignant neoplasm of unspecified site of left female breast: Secondary | ICD-10-CM

## 2019-10-18 DIAGNOSIS — R9389 Abnormal findings on diagnostic imaging of other specified body structures: Secondary | ICD-10-CM

## 2019-10-18 DIAGNOSIS — C7989 Secondary malignant neoplasm of other specified sites: Secondary | ICD-10-CM | POA: Diagnosis not present

## 2019-10-18 DIAGNOSIS — C7951 Secondary malignant neoplasm of bone: Secondary | ICD-10-CM

## 2019-10-18 DIAGNOSIS — C50211 Malignant neoplasm of upper-inner quadrant of right female breast: Secondary | ICD-10-CM | POA: Diagnosis not present

## 2019-10-18 DIAGNOSIS — C50212 Malignant neoplasm of upper-inner quadrant of left female breast: Secondary | ICD-10-CM

## 2019-10-18 DIAGNOSIS — C50911 Malignant neoplasm of unspecified site of right female breast: Secondary | ICD-10-CM

## 2019-10-18 DIAGNOSIS — Z170 Estrogen receptor positive status [ER+]: Secondary | ICD-10-CM | POA: Diagnosis not present

## 2019-10-18 LAB — CBC WITH DIFFERENTIAL/PLATELET
Abs Immature Granulocytes: 0.01 10*3/uL (ref 0.00–0.07)
Basophils Absolute: 0.1 10*3/uL (ref 0.0–0.1)
Basophils Relative: 2 %
Eosinophils Absolute: 0.1 10*3/uL (ref 0.0–0.5)
Eosinophils Relative: 2 %
HCT: 29.6 % — ABNORMAL LOW (ref 36.0–46.0)
Hemoglobin: 10.2 g/dL — ABNORMAL LOW (ref 12.0–15.0)
Immature Granulocytes: 0 %
Lymphocytes Relative: 23 %
Lymphs Abs: 0.9 10*3/uL (ref 0.7–4.0)
MCH: 32.7 pg (ref 26.0–34.0)
MCHC: 34.5 g/dL (ref 30.0–36.0)
MCV: 94.9 fL (ref 80.0–100.0)
Monocytes Absolute: 0.6 10*3/uL (ref 0.1–1.0)
Monocytes Relative: 14 %
Neutro Abs: 2.5 10*3/uL (ref 1.7–7.7)
Neutrophils Relative %: 59 %
Platelets: 187 10*3/uL (ref 150–400)
RBC: 3.12 MIL/uL — ABNORMAL LOW (ref 3.87–5.11)
RDW: 17.4 % — ABNORMAL HIGH (ref 11.5–15.5)
WBC: 4.1 10*3/uL (ref 4.0–10.5)
nRBC: 0 % (ref 0.0–0.2)

## 2019-10-18 LAB — COMPREHENSIVE METABOLIC PANEL
ALT: 14 U/L (ref 0–44)
AST: 26 U/L (ref 15–41)
Albumin: 3.5 g/dL (ref 3.5–5.0)
Alkaline Phosphatase: 60 U/L (ref 38–126)
Anion gap: 11 (ref 5–15)
BUN: 15 mg/dL (ref 8–23)
CO2: 23 mmol/L (ref 22–32)
Calcium: 8.3 mg/dL — ABNORMAL LOW (ref 8.9–10.3)
Chloride: 109 mmol/L (ref 98–111)
Creatinine, Ser: 1.07 mg/dL — ABNORMAL HIGH (ref 0.44–1.00)
GFR calc Af Amer: 57 mL/min — ABNORMAL LOW (ref >60)
GFR calc non Af Amer: 49 mL/min — ABNORMAL LOW (ref >60)
Glucose, Bld: 103 mg/dL — ABNORMAL HIGH (ref 70–99)
Potassium: 3.4 mmol/L — ABNORMAL LOW (ref 3.5–5.1)
Sodium: 143 mmol/L (ref 135–145)
Total Bilirubin: 0.4 mg/dL (ref 0.3–1.2)
Total Protein: 6.8 g/dL (ref 6.5–8.1)

## 2019-10-19 ENCOUNTER — Encounter: Payer: Self-pay | Admitting: Oncology

## 2019-10-19 ENCOUNTER — Encounter: Payer: Self-pay | Admitting: Physical Therapy

## 2019-10-19 ENCOUNTER — Ambulatory Visit: Payer: Medicare Other | Admitting: Physical Therapy

## 2019-10-19 DIAGNOSIS — M25612 Stiffness of left shoulder, not elsewhere classified: Secondary | ICD-10-CM | POA: Diagnosis not present

## 2019-10-19 DIAGNOSIS — I890 Lymphedema, not elsewhere classified: Secondary | ICD-10-CM

## 2019-10-19 DIAGNOSIS — M25512 Pain in left shoulder: Secondary | ICD-10-CM | POA: Diagnosis not present

## 2019-10-19 LAB — CANCER ANTIGEN 27.29: CA 27.29: 48.5 U/mL — ABNORMAL HIGH (ref 0.0–38.6)

## 2019-10-19 NOTE — Therapy (Signed)
[place], [place], [address] Phone: [phone]   Fax:  [phone]  Physical Therapy Treatment  Patient Details  Name: Stacey [name] MRN: [id number] Date of Birth: 12/29/1940 Referring Provider (PT): Lurline Del MD   Encounter Date: 10/19/2019   PT End of Session - 10/19/19 1602    Visit Number 9    Number of Visits 17    Date for PT Re-Evaluation 11/01/19    PT Start Time 9753    PT Stop Time 1501    PT Time Calculation (min) 59 min    Activity Tolerance Patient tolerated treatment well    Behavior During Therapy [facility] for tasks assessed/performed           Past Medical History:  Diagnosis Date  . ANEMIA-NOS   . ANXIETY   . Breast cancer ([place]) 1997 L, 2012 R   s/p chemo/xrt  . COPD    resolved  . DIVERTICULOSIS, COLON 2008  . Dizziness   . Family history of breast cancer   . Family history of lung cancer   . Family history of lymphoma   . Family history of pancreatic cancer   . Family history of uterine cancer   . GERD   . Hx of radiation therapy 10/19/11 -12/03/11   right breast  . HYPERLIPIDEMIA   . IRRITABLE BOWEL SYNDROME, HX OF   . Left-sided carotid artery disease (HCC)    moderate left ICA stenosis  . OSTEOARTHRITIS, HAND   . PSVT (paroxysmal supraventricular tachycardia) (HCC)    symptomatic on event monitor    Past Surgical History:  Procedure Laterality Date  . ABDOMINAL HYSTERECTOMY    . APPENDECTOMY    . BREAST BIOPSY  11/14/10    r breast: inv, insitu mammary carcinoma w/calcif, er/pr +, her2 -  . BREAST SURGERY     lumpectomy  . CATARACT EXTRACTION     both eyes  . ELECTROPHYSIOLOGIC STUDY N/A 05/10/2015   Procedure: SVT Ablation;  Surgeon: Will Meredith Leeds, MD;  Location: [facility];  Service: Cardiovascular;  Laterality: N/A;  . HERNIA REPAIR    . inguinal herniorrhapy  1984   left  . IR US GUIDE BX ASP/DRAIN  05/26/2019  . rectal fissure  repair    . s/p benign breast biopsy  2003   right  . s/p left foot surgury  2009  . 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 spiral fx left foot  2008   no surgury  . TMJ ARTHROPLASTY  1989  . [place]- to remove scar tissue growth   . TONSILLECTOMY      There were no vitals filed for this visit.   Subjective Assessment - 10/19/19 1406    Subjective I am going to go over to [place] today. I don' thave an appointment but I am hoping to pick up my garment. We have been playing phone tag.    Pertinent History Hx breast cancer with 15 lymph node removal on the L and Bil lumpectomy. Lumpectomy on the L in 1997and the R in 2012. Radiation Bil.  New discovery of metastatic breat and is trying to find a treatment she can tolerate    Patient Stated Goals to get some relief  for her arm    Currently in Pain? No/denies    Pain Score 0-No pain  [facility]  Lymphedema Life Impact Scale Total Score 61.76 %            [facility] Adult PT Treatment/Exercise - 10/19/19 0001      Manual Therapy   Manual Lymphatic Drainage (MLD) In Supine: short neck, 5 diaphragmatic breaths, Rt axillary and Lt inguinal nodes, anterior inter-axillary and Lt axillo-inguinal anastomosis, then focused on Lt UE working from lateral upper arm to dorsal hand, then retacing all steps back to pathways, no wrist nerve pain today    Passive ROM attempted ROM today but pt had pain across her chest that was new. A small nodule was palpable at anterior chest but then was no longer palpable. Pt stated her chest felt different than it had in the past and she was going to get in touch with her doctor.                        PT Long Term Goals - 08/31/19 1956      PT LONG TERM GOAL #1   Title Pt will report the pain in her left arm is decreased by 50%    Time 8    Period Weeks     Status New      PT LONG TERM GOAL #2   Title Patient to be properly fitted with appropriate compression garment to wear on daily basis for home management.    Time 8    Period Weeks    Status New      PT LONG TERM GOAL #3   Title Pt will report she knows how to manage her lymphedema at home    Time 8    Period Weeks    Status New      PT LONG TERM GOAL #4   Title Pt will have 90 degrees of left shoulder flexion and abduction so that she has an improved quality of  life    Time 8    Period Weeks    Status New      PT LONG TERM GOAL #5   Title Pt will improve LLIS to 30% or less within 4 weeks in order to demonstrate objective improvement in functional activitie.    Baseline 61.76 on 08/31/2019    Time 8    Period Weeks    Status New                 Plan - 10/19/19 1603    Clinical Impression Statement Attempted PROM to L shoulder today but stopped due to pain across L chest. A small nodule was palpable and pt reported increased pain when it was palpated but then it was no longer palpable. Pt was going to reach out to her doctor regarding this. Continued with MLD to LUE. Pt plans on trying to get her garment this afternoon.    Rehab Potential Good    PT Frequency 2x / week    PT Duration 8 weeks    PT Treatment/Interventions Therapeutic activities;Therapeutic exercise;Iontophoresis 76m/ml Dexamethasone;Patient/family education;Manual techniques;Neuromuscular re-education    PT Next Visit Plan MLD and gentle P/AROM to left shoulder. check Tribute wrap once it arrives    PT Home Exercise Plan Medbrideg access code: [email or web address]   Consulted and Agree with Plan of Care Patient           Patient will benefit from skilled therapeutic intervention in order to improve the following deficits and impairments:  Decreased  range of motion, Pain, Increased edema  Visit Diagnosis: Lymphedema, not elsewhere classified     Problem List Patient Active Problem List   Diagnosis Date  Noted  . Genetic testing 06/21/2019  . Family history of breast cancer   . Family history of pancreatic cancer   . Family history of uterine cancer   . Family history of lymphoma   . Family history of lung cancer   . Bone metastases ([place]) 06/02/2019  . Recurrent cancer of left breast ([place]) 05/15/2019  . Pes anserine bursitis 03/31/2019  . HTN (hypertension) 02/10/2019  . Hyperglycemia 06/21/2018  . Left carpal tunnel syndrome 02/22/2018  . Rotator cuff arthropathy of left shoulder 09/20/2017  . Cervical radiculitis 04/09/2017  . Left shoulder pain 04/09/2017  . Dyspnea on exertion 05/14/2016  . 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 Lymphedema of left arm 01/14/2016  . Left arm swelling 12/25/2015  . SVT (supraventricular tachycardia) ([place])   . Left-sided carotid artery disease ([place]) 03/08/2015  . Dizziness 01/24/2015  . Urinary frequency 01/13/2015  . Dizziness and giddiness 01/09/2015  . Gallstones 02/21/2013  . Malignant neoplasm of upper-inner quadrant of left breast in female, estrogen receptor positive ([place]) 11/28/2012  . Muscle cramping 09/12/2012  . Right hip pain 09/12/2012  . Lower back pain 09/12/2012  . COPD GOLD I    . Hx of radiation therapy   . Right shoulder pain 09/14/2011  . Preventative health care 07/29/2010  . Skin lesion of left leg 07/29/2010  . Paresthesia 07/29/2010  . GERD 03/28/2010  . CONSTIPATION 03/28/2010  . Osteoarthrosis, 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  . Anxiety state 05/03/2009  . Hyperlipidemia 06/14/2007  . FATIGUE 06/14/2007  . 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 DIVERTICULOSIS, COLON 12/17/2006  . Cough 12/17/2006  . IRRITABLE BOWEL SYNDROME, HX OF 12/17/2006    Allyson Sabal [facility] 10/19/2019, 4:05 PM  [place], [place], [address] Phone: [phone]   Fax:  [phone]  Name: Stacey [name] MRN: [id number] Date of Birth:  [birth year]  Manus Gunning, PT 10/19/19 4:06 PM

## 2019-10-23 ENCOUNTER — Ambulatory Visit: Payer: Medicare Other | Admitting: Physical Therapy

## 2019-10-23 ENCOUNTER — Encounter: Payer: Self-pay | Admitting: Physical Therapy

## 2019-10-23 ENCOUNTER — Other Ambulatory Visit: Payer: Self-pay

## 2019-10-23 DIAGNOSIS — I890 Lymphedema, not elsewhere classified: Secondary | ICD-10-CM | POA: Diagnosis not present

## 2019-10-23 DIAGNOSIS — M25512 Pain in left shoulder: Secondary | ICD-10-CM | POA: Diagnosis not present

## 2019-10-23 DIAGNOSIS — M25612 Stiffness of left shoulder, not elsewhere classified: Secondary | ICD-10-CM | POA: Diagnosis not present

## 2019-10-23 NOTE — Therapy (Signed)
[facility] [address] [place], [place], [address] Phone: [phone]   Fax:  [phone]  Physical Therapy Treatment  Patient Details  Name: Stacey [name] MRN: [id number] Date of Birth: 07/03/1940 Referring Provider (PT): Gustav Magrinat MD   Encounter Date: 10/23/2019   PT End of Session - 10/23/19 1503    Visit Number 10    Number of Visits 17    Date for PT Re-Evaluation 11/01/19    PT Start Time 1411    PT Stop Time 1500    PT Time Calculation (min) 49 min    Activity Tolerance Patient tolerated treatment well    Behavior During Therapy [facility] for tasks assessed/performed           Past Medical History:  Diagnosis Date  . ANEMIA-NOS   . ANXIETY   . Breast cancer (HCC) 1997 L, 2012 R   s/p chemo/xrt  . COPD    resolved  . DIVERTICULOSIS, COLON 2008  . Dizziness   . Family history of breast cancer   . Family history of lung cancer   . Family history of lymphoma   . Family history of pancreatic cancer   . Family history of uterine cancer   . GERD   . Hx of radiation therapy 10/19/11 -12/03/11   right breast  . HYPERLIPIDEMIA   . IRRITABLE BOWEL SYNDROME, HX OF   . Left-sided carotid artery disease (HCC)    moderate left ICA stenosis  . OSTEOARTHRITIS, HAND   . PSVT (paroxysmal supraventricular tachycardia) (HCC)    symptomatic on event monitor    Past Surgical History:  Procedure Laterality Date  . ABDOMINAL HYSTERECTOMY    . APPENDECTOMY    . BREAST BIOPSY  11/14/10    r breast: inv, insitu mammary carcinoma w/calcif, er/pr +, her2 -  . BREAST SURGERY     lumpectomy  . CATARACT EXTRACTION     both eyes  . ELECTROPHYSIOLOGIC STUDY N/A 05/10/2015   Procedure: SVT Ablation;  Surgeon: Will Martin Camnitz, MD;  [facility];  Service: Cardiovascular;  Laterality: N/A;  . HERNIA REPAIR    . inguinal herniorrhapy  1984   left  . IR US GUIDE BX ASP/DRAIN  05/26/2019  . rectal fissure  repair    . s/p benign breast biopsy  2003   right  . s/p left foot surgury  2009  . 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 spiral fx left foot  2008   no surgury  . TMJ ARTHROPLASTY  1989  . TONGUE SURGERY     1988- to remove scar tissue growth   . TONSILLECTOMY      There were no vitals filed for this visit.   Subjective Assessment - 10/23/19 1412    Subjective I went to [facility] and the garment did not fit right. We could not get it on me. They had to order a different garment.    Pertinent History Hx breast cancer with 15 lymph node removal on the L and Bil lumpectomy. Lumpectomy on the L in 1997and the R in 2012. Radiation Bil.  New discovery of metastatic breat and is trying to find a treatment she can tolerate    Patient Stated Goals to get some relief  for her arm    Currently in Pain? No/denies    Pain Score 0-No pain                         Outpatient Re[facility]  Lymphedema Life Impact Scale Total Score 61.76 %            [facility] Adult PT Treatment/Exercise - 10/23/19 0001      Manual Therapy   Manual Lymphatic Drainage (MLD) In Supine: short neck, 5 diaphragmatic breaths, Rt axillary and Lt inguinal nodes, anterior inter-axillary and Lt axillo-inguinal anastomosis, then focused on Lt UE working from lateral upper arm to dorsal hand, then retacing all steps back to pathways, no wrist nerve pain today    Passive ROM PROM to L shoulder in to abduction and flexion to what pt could tolerate                       PT Long Term Goals - 08/31/19 1956      PT LONG TERM GOAL #1   Title Pt will report the pain in her left arm is decreased by 50%    Time 8    Period Weeks    Status New      PT LONG TERM GOAL #2   Title Patient to be properly fitted with appropriate compression garment to wear on daily basis for home management.    Time 8    Period Weeks    Status New       PT LONG TERM GOAL #3   Title Pt will report she knows how to manage her lymphedema at home    Time 8    Period Weeks    Status New      PT LONG TERM GOAL #4   Title Pt will have 90 degrees of left shoulder flexion and abduction so that she has an improved quality of  life    Time 8    Period Weeks    Status New      PT LONG TERM GOAL #5   Title Pt will improve LLIS to 30% or less within 4 weeks in order to demonstrate objective improvement in functional activitie.    Baseline 61.76 on 08/31/2019    Time 8    Period Weeks    Status New                 Plan - 10/23/19 1504    Clinical Impression Statement Continued with MLD to LUE. Pt went to [facility] to pick up her compression garment and reports it was too tight and too difficult to don. She report they were going to order a completely different garment. Pt was able to tolerate PROM to L shoulder better today with no increased pain across L chest.    PT Frequency 2x / week    PT Duration 8 weeks    PT Treatment/Interventions Therapeutic activities;Therapeutic exercise;Iontophoresis 4mg/ml Dexamethasone;Patient/family education;Manual techniques;Neuromuscular re-education    PT Next Visit Plan MLD and gentle P/AROM to left shoulder. check Tribute wrap once it arrives    PT Home Exercise Plan Medbrideg access code: [email or web address]    Consulted and Agree with Plan of Care Patient           Patient will benefit from skilled therapeutic intervention in order to improve the following deficits and impairments:  Decreased range of motion, Pain, Increased edema  Visit Diagnosis: Lymphedema, not elsewhere classified     Problem List Patient Active Problem List   Diagnosis Date Noted  . Genetic testing 06/21/2019  . Family history of breast cancer   . Family history of pancreatic   cancer   . Family history of uterine cancer   . Family history of lymphoma   . Family history of lung cancer   . Bone metastases ([place])  06/02/2019  . Recurrent cancer of left breast ([place]) 05/15/2019  . Pes anserine bursitis 03/31/2019  . HTN (hypertension) 02/10/2019  . Hyperglycemia 06/21/2018  . Left carpal tunnel syndrome 02/22/2018  . Rotator cuff arthropathy of left shoulder 09/20/2017  . Cervical radiculitis 04/09/2017  . Left shoulder pain 04/09/2017  . Dyspnea on exertion 05/14/2016  . 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 Lymphedema of left arm 01/14/2016  . Left arm swelling 12/25/2015  . SVT (supraventricular tachycardia) ([place])   . Left-sided carotid artery disease ([place]) 03/08/2015  . Dizziness 01/24/2015  . Urinary frequency 01/13/2015  . Dizziness and giddiness 01/09/2015  . Gallstones 02/21/2013  . Malignant neoplasm of upper-inner quadrant of left breast in female, estrogen receptor positive ([place]) 11/28/2012  . Muscle cramping 09/12/2012  . Right hip pain 09/12/2012  . Lower back pain 09/12/2012  . COPD GOLD I    . Hx of radiation therapy   . Right shoulder pain 09/14/2011  . Preventative health care 07/29/2010  . Skin lesion of left leg 07/29/2010  . Paresthesia 07/29/2010  . GERD 03/28/2010  . CONSTIPATION 03/28/2010  . Osteoarthrosis, 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  . Anxiety state 05/03/2009  . Hyperlipidemia 06/14/2007  . FATIGUE 06/14/2007  . 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 DIVERTICULOSIS, COLON 12/17/2006  . Cough 12/17/2006  . IRRITABLE BOWEL SYNDROME, HX OF 12/17/2006    Allyson Sabal [facility] 10/23/2019, 3:06 PM  [place], [place], [address] Phone: [phone]   Fax:  [phone]  Name: Stacey [name] MRN: [id number] Date of Birth: 02-Dec-1940  Manus Gunning, PT 10/23/19 3:06 PM

## 2019-10-24 ENCOUNTER — Other Ambulatory Visit: Payer: Self-pay | Admitting: Internal Medicine

## 2019-10-24 NOTE — Telephone Encounter (Signed)
Done erx 

## 2019-10-25 ENCOUNTER — Encounter: Payer: Self-pay | Admitting: Physical Therapy

## 2019-10-25 ENCOUNTER — Other Ambulatory Visit: Payer: Self-pay

## 2019-10-25 ENCOUNTER — Ambulatory Visit: Payer: Medicare Other | Admitting: Physical Therapy

## 2019-10-25 DIAGNOSIS — M25612 Stiffness of left shoulder, not elsewhere classified: Secondary | ICD-10-CM | POA: Diagnosis not present

## 2019-10-25 DIAGNOSIS — I890 Lymphedema, not elsewhere classified: Secondary | ICD-10-CM

## 2019-10-25 DIAGNOSIS — M25512 Pain in left shoulder: Secondary | ICD-10-CM

## 2019-10-25 NOTE — Therapy (Signed)
[place], [place], [address] Phone: [phone]   Fax:  [phone]  Physical Therapy Treatment  Patient Details  Name: Stacey [name] MRN: [id number] Date of Birth: [birth year] Referring Provider (PT): Lurline Del MD   Encounter Date: 10/25/2019   PT End of Session - 10/25/19 1620    Visit Number 11    Number of Visits 17    Date for PT Re-Evaluation 11/01/19    PT Start Time 1400    PT Stop Time 1445    PT Time Calculation (min) 45 min    Activity Tolerance Patient tolerated treatment well    Behavior During Therapy [facility] for tasks assessed/performed           Past Medical History:  Diagnosis Date  . ANEMIA-NOS   . ANXIETY   . Breast cancer ([place]) 1997 L, 2012 R   s/p chemo/xrt  . COPD    resolved  . DIVERTICULOSIS, COLON 2008  . Dizziness   . Family history of breast cancer   . Family history of lung cancer   . Family history of lymphoma   . Family history of pancreatic cancer   . Family history of uterine cancer   . GERD   . Hx of radiation therapy 10/19/11 -12/03/11   right breast  . HYPERLIPIDEMIA   . IRRITABLE BOWEL SYNDROME, HX OF   . Left-sided carotid artery disease (HCC)    moderate left ICA stenosis  . OSTEOARTHRITIS, HAND   . PSVT (paroxysmal supraventricular tachycardia) (HCC)    symptomatic on event monitor    Past Surgical History:  Procedure Laterality Date  . ABDOMINAL HYSTERECTOMY    . APPENDECTOMY    . BREAST BIOPSY  11/14/10    r breast: inv, insitu mammary carcinoma w/calcif, er/pr +, her2 -  . BREAST SURGERY     lumpectomy  . CATARACT EXTRACTION     both eyes  . ELECTROPHYSIOLOGIC STUDY N/A 05/10/2015   Procedure: SVT Ablation;  Surgeon: Will Meredith Leeds, MD;  Location: [facility];  Service: Cardiovascular;  Laterality: N/A;  . HERNIA REPAIR    . inguinal herniorrhapy  1984   left  . IR US GUIDE BX ASP/DRAIN  05/26/2019  . rectal fissure  repair    . s/p benign breast biopsy  2003   right  . s/p left foot surgury  2009  . 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 spiral fx left foot  2008   no surgury  . TMJ ARTHROPLASTY  1989  . [place]- to remove scar tissue growth   . TONSILLECTOMY      There were no vitals filed for this visit.   Subjective Assessment - 10/25/19 1411    Subjective Pt has not received the garment from [place]. They are ordering her a different garment. She has occasional pain in her left wrist and thumb    Pertinent History Hx breast cancer with 15 lymph node removal on the L and Bil lumpectomy. Lumpectomy on the L in 1997and the R in 2012. Radiation Bil.  New discovery of metastatic breat and is trying to find a treatment she can tolerate    Patient Stated Goals to get some relief  for her arm    Currently in Pain? No/denies  [facility]  Lymphedema Life Impact Scale Total Score 61.76 %            [facility] Adult PT Treatment/Exercise - 10/25/19 0001      Manual Therapy   Manual Therapy Edema management;Manual Lymphatic Drainage (MLD);Passive ROM    Edema Management provided new tg soft from wrist to axilla with deep fold at the top     Manual Lymphatic Drainage (MLD) In Supine: short neck, 5 diaphragmatic breaths, Rt axillary and Lt inguinal nodes, anterior inter-axillary and Lt axillo-inguinal anastomosis, then focused on Lt UE working from lateral upper arm to dorsal hand, then retacing all steps back to pathways, no wrist nerve pain today    Passive ROM PROM to L shoulder in to abduction and flexion to what pt could tolerate                       PT Long Term Goals - 10/25/19 1622      PT LONG TERM GOAL #1   Title Pt will report the pain in her left arm is decreased by 50%    Time 8    Period Weeks    Status On-going      PT LONG TERM GOAL  #2   Title Patient to be properly fitted with appropriate compression garment to wear on daily basis for home management.    Time 8    Status On-going      PT LONG TERM GOAL #3   Title Pt will report she knows how to manage her lymphedema at home    Time 8    Period Weeks    Status On-going      PT LONG TERM GOAL #4   Title Pt will have 90 degrees of left shoulder flexion and abduction so that she has an improved quality of  life    Time 8    Period Weeks    Status On-going      PT LONG TERM GOAL #5   Title Pt will improve LLIS to 30% or less within 4 weeks in order to demonstrate objective improvement in functional activitie.    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 Her skin appears to be smoother today. she is having pain in wrist and fingers and thinks she might be having trouble with her carpal tunnel    Personal Factors and Comorbidities Comorbidity 2    Comorbidities Bil radiation due to Bil breast cancer and lumpectomy, 15 lymph node removal on the L    Stability/Clinical Decision Making Stable/Uncomplicated    Rehab Potential Good    PT Frequency 2x / week    PT Duration 8 weeks    PT Treatment/Interventions Therapeutic activities;Therapeutic exercise;Iontophoresis 4mg /ml Dexamethasone;Patient/family education;Manual techniques;Neuromuscular re-education    PT Next Visit Plan MLD and gentle P/AROM to left shoulder. check compression garment once it arrives           Patient will benefit from skilled therapeutic intervention in order to improve the following deficits and impairments:  Decreased range of motion, Pain, Increased edema  Visit Diagnosis: Lymphedema, not elsewhere classified  Stiffness of left shoulder, not elsewhere classified  Left shoulder pain, unspecified chronicity     Problem List  Patient Active  Problem List   Diagnosis Date Noted  . Genetic testing 06/21/2019  . Family history of breast cancer   . Family history of pancreatic cancer   . Family history of uterine cancer   . Family history of lymphoma   . Family history of lung cancer   . Bone metastases ([place]) 06/02/2019  . Recurrent cancer of left breast ([place]) 05/15/2019  . Pes anserine bursitis 03/31/2019  . HTN (hypertension) 02/10/2019  . Hyperglycemia 06/21/2018  . Left carpal tunnel syndrome 02/22/2018  . Rotator cuff arthropathy of left shoulder 09/20/2017  . Cervical radiculitis 04/09/2017  . Left shoulder pain 04/09/2017  . Dyspnea on exertion 05/14/2016  . 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 Lymphedema of left arm 01/14/2016  . Left arm swelling 12/25/2015  . SVT (supraventricular tachycardia) ([place])   . Left-sided carotid artery disease ([place]) 03/08/2015  . Dizziness 01/24/2015  . Urinary frequency 01/13/2015  . Dizziness and giddiness 01/09/2015  . Gallstones 02/21/2013  . Malignant neoplasm of upper-inner quadrant of left breast in female, estrogen receptor positive ([place]) 11/28/2012  . Muscle cramping 09/12/2012  . Right hip pain 09/12/2012  . Lower back pain 09/12/2012  . COPD GOLD I    . Hx of radiation therapy   . Right shoulder pain 09/14/2011  . Preventative health care 07/29/2010  . Skin lesion of left leg 07/29/2010  . Paresthesia 07/29/2010  . GERD 03/28/2010  . CONSTIPATION 03/28/2010  . Osteoarthrosis, 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  . Anxiety state 05/03/2009  . Hyperlipidemia 06/14/2007  . FATIGUE 06/14/2007  . 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 DIVERTICULOSIS, COLON 12/17/2006  . Cough 12/17/2006  . IRRITABLE BOWEL SYNDROME, HX OF 12/17/2006    Donato Heinz. Owens Shark PT  [facility] 10/25/2019, 4:24 PM  [place], [place], [address] Phone: [phone]   Fax:   [phone]  Name: Stacey [name] MRN: [id number] Date of Birth: Nov 19, 1940

## 2019-10-26 ENCOUNTER — Other Ambulatory Visit: Payer: Self-pay

## 2019-10-26 ENCOUNTER — Inpatient Hospital Stay: Payer: Medicare Other

## 2019-10-26 DIAGNOSIS — C50211 Malignant neoplasm of upper-inner quadrant of right female breast: Secondary | ICD-10-CM

## 2019-10-26 DIAGNOSIS — Z170 Estrogen receptor positive status [ER+]: Secondary | ICD-10-CM | POA: Diagnosis not present

## 2019-10-26 DIAGNOSIS — R9389 Abnormal findings on diagnostic imaging of other specified body structures: Secondary | ICD-10-CM

## 2019-10-26 DIAGNOSIS — C7989 Secondary malignant neoplasm of other specified sites: Secondary | ICD-10-CM | POA: Diagnosis not present

## 2019-10-26 DIAGNOSIS — C50911 Malignant neoplasm of unspecified site of right female breast: Secondary | ICD-10-CM

## 2019-10-26 LAB — COMPREHENSIVE METABOLIC PANEL
ALT: 16 U/L (ref 0–44)
AST: 23 U/L (ref 15–41)
Albumin: 3.5 g/dL (ref 3.5–5.0)
Alkaline Phosphatase: 59 U/L (ref 38–126)
Anion gap: 10 (ref 5–15)
BUN: 15 mg/dL (ref 8–23)
CO2: 25 mmol/L (ref 22–32)
Calcium: 8.4 mg/dL — ABNORMAL LOW (ref 8.9–10.3)
Chloride: 107 mmol/L (ref 98–111)
Creatinine, Ser: 1 mg/dL (ref 0.44–1.00)
GFR calc Af Amer: >60 mL/min (ref >60)
GFR calc non Af Amer: 54 mL/min — ABNORMAL LOW (ref >60)
Glucose, Bld: 100 mg/dL — ABNORMAL HIGH (ref 70–99)
Potassium: 3.4 mmol/L — ABNORMAL LOW (ref 3.5–5.1)
Sodium: 142 mmol/L (ref 135–145)
Total Bilirubin: 0.4 mg/dL (ref 0.3–1.2)
Total Protein: 6.9 g/dL (ref 6.5–8.1)

## 2019-10-26 LAB — CBC WITH DIFFERENTIAL/PLATELET
Abs Immature Granulocytes: 0.03 10*3/uL (ref 0.00–0.07)
Basophils Absolute: 0 10*3/uL (ref 0.0–0.1)
Basophils Relative: 1 %
Eosinophils Absolute: 0.1 10*3/uL (ref 0.0–0.5)
Eosinophils Relative: 2 %
HCT: 30.4 % — ABNORMAL LOW (ref 36.0–46.0)
Hemoglobin: 10.5 g/dL — ABNORMAL LOW (ref 12.0–15.0)
Immature Granulocytes: 1 %
Lymphocytes Relative: 23 %
Lymphs Abs: 0.8 10*3/uL (ref 0.7–4.0)
MCH: 33 pg (ref 26.0–34.0)
MCHC: 34.5 g/dL (ref 30.0–36.0)
MCV: 95.6 fL (ref 80.0–100.0)
Monocytes Absolute: 0.3 10*3/uL (ref 0.1–1.0)
Monocytes Relative: 9 %
Neutro Abs: 2.1 10*3/uL (ref 1.7–7.7)
Neutrophils Relative %: 64 %
Platelets: 302 10*3/uL (ref 150–400)
RBC: 3.18 MIL/uL — ABNORMAL LOW (ref 3.87–5.11)
RDW: 16.4 % — ABNORMAL HIGH (ref 11.5–15.5)
WBC: 3.3 10*3/uL — ABNORMAL LOW (ref 4.0–10.5)
nRBC: 0 % (ref 0.0–0.2)

## 2019-10-26 NOTE — Progress Notes (Signed)
This appt was suppose to have been cancelled but wasn't. Patient was made aware that she wasn't due at this time for Xgeva and to return on 10/13 for MD appt and to schedule injection in November. Patient expressed understanding.

## 2019-10-30 ENCOUNTER — Ambulatory Visit: Payer: Medicare Other | Admitting: Physical Therapy

## 2019-10-30 ENCOUNTER — Encounter: Payer: Self-pay | Admitting: Physical Therapy

## 2019-10-30 ENCOUNTER — Other Ambulatory Visit: Payer: Self-pay

## 2019-10-30 DIAGNOSIS — M25612 Stiffness of left shoulder, not elsewhere classified: Secondary | ICD-10-CM | POA: Diagnosis not present

## 2019-10-30 DIAGNOSIS — I890 Lymphedema, not elsewhere classified: Secondary | ICD-10-CM | POA: Diagnosis not present

## 2019-10-30 DIAGNOSIS — M25512 Pain in left shoulder: Secondary | ICD-10-CM | POA: Diagnosis not present

## 2019-10-30 NOTE — Therapy (Signed)
[place], [place], [address] Phone: [phone]   Fax:  [phone]  Physical Therapy Treatment  Patient Details  Name: Stacey [name] MRN: [id number] Date of Birth: March 28, 1940 Referring Provider (PT): Lurline Del MD   Encounter Date: 10/30/2019   PT End of Session - 10/30/19 1455    Visit Number 12    Number of Visits 17    Date for PT Re-Evaluation 11/01/19    PT Start Time 8242    PT Stop Time 1455    PT Time Calculation (min) 51 min    Activity Tolerance Patient tolerated treatment well    Behavior During Therapy [facility] for tasks assessed/performed           Past Medical History:  Diagnosis Date  . ANEMIA-NOS   . ANXIETY   . Breast cancer ([place]) 1997 L, 2012 R   s/p chemo/xrt  . COPD    resolved  . DIVERTICULOSIS, COLON 2008  . Dizziness   . Family history of breast cancer   . Family history of lung cancer   . Family history of lymphoma   . Family history of pancreatic cancer   . Family history of uterine cancer   . GERD   . Hx of radiation therapy 10/19/11 -12/03/11   right breast  . HYPERLIPIDEMIA   . IRRITABLE BOWEL SYNDROME, HX OF   . Left-sided carotid artery disease (HCC)    moderate left ICA stenosis  . OSTEOARTHRITIS, HAND   . PSVT (paroxysmal supraventricular tachycardia) (HCC)    symptomatic on event monitor    Past Surgical History:  Procedure Laterality Date  . ABDOMINAL HYSTERECTOMY    . APPENDECTOMY    . BREAST BIOPSY  11/14/10    r breast: inv, insitu mammary carcinoma w/calcif, er/pr +, her2 -  . BREAST SURGERY     lumpectomy  . CATARACT EXTRACTION     both eyes  . ELECTROPHYSIOLOGIC STUDY N/A 05/10/2015   Procedure: SVT Ablation;  Surgeon: Will Meredith Leeds, MD;  Location: [place] [facility];  Service: Cardiovascular;  Laterality: N/A;  . HERNIA REPAIR    . inguinal herniorrhapy  1984   left  . IR US GUIDE BX ASP/DRAIN  05/26/2019  . rectal fissure  repair    . s/p benign breast biopsy  2003   right  . s/p left foot surgury  2009  . 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 spiral fx left foot  2008   no surgury  . TMJ ARTHROPLASTY  1989  . [place]- to remove scar tissue growth   . TONSILLECTOMY      There were no vitals filed for this visit.   Subjective Assessment - 10/30/19 1404    Subjective I haven't heard from them about my garment. My arm is about the same. My wrist was very painful saturday night for some reason.    Pertinent History Hx breast cancer with 15 lymph node removal on the L and Bil lumpectomy. Lumpectomy on the L in 1997and the R in 2012. Radiation Bil.  New discovery of metastatic breat and is trying to find a treatment she can tolerate    Patient Stated Goals to get some relief  for her arm    Currently in Pain? No/denies    Pain Score 0-No pain  [facility]  Lymphedema Life Impact Scale Total Score 61.76 %            [facility] Adult PT Treatment/Exercise - 10/30/19 0001      Manual Therapy   Manual Lymphatic Drainage (MLD) In Supine: short neck, 5 diaphragmatic breaths, Rt axillary and Lt inguinal nodes, anterior inter-axillary and Lt axillo-inguinal anastomosis, then focused on Lt UE working from lateral upper arm to dorsal hand, then retacing all steps back to pathways, no wrist nerve pain today    Passive ROM attempted PROM but pt had sharp pain again in area of left pec and small nodule was palpable again so stopped due to pain                       PT Long Term Goals - 10/25/19 1622      PT LONG TERM GOAL #1   Title Pt will report the pain in her left arm is decreased by 50%    Time 8    Period Weeks    Status On-going      PT LONG TERM GOAL #2   Title Patient to be properly fitted with appropriate compression garment to wear on daily basis for home  management.    Time 8    Status On-going      PT LONG TERM GOAL #3   Title Pt will report she knows how to manage her lymphedema at home    Time 8    Period Weeks    Status On-going      PT LONG TERM GOAL #4   Title Pt will have 90 degrees of left shoulder flexion and abduction so that she has an improved quality of  life    Time 8    Period Weeks    Status On-going      PT LONG TERM GOAL #5   Title Pt will improve LLIS to 30% or less within 4 weeks in order to demonstrate objective improvement in functional activitie.    Period Weeks    Status On-going                 Plan - 10/30/19 1457    Clinical Impression Statement Continued with MLD to LUE while pt awaits arrival of her compression garment. Attempted PROM to L shoulder today but pt had sharp pain across left chest when her arm was lifted and a small tender nodule was palpable again today. Pt has a scan coming up with her oncologist and will ask about this.    PT Frequency 2x / week    PT Duration 8 weeks    PT Treatment/Interventions Therapeutic activities;Therapeutic exercise;Iontophoresis 28m/ml Dexamethasone;Patient/family education;Manual techniques;Neuromuscular re-education    PT Next Visit Plan recert next visit, MLD and gentle P/AROM to left shoulder. check compression garment once it arrives    PT Home Exercise Plan [facility]   Consulted and Agree with Plan of Care Patient           Patient will benefit from skilled therapeutic intervention in order to improve the following deficits and impairments:  Decreased range of motion, Pain, Increased edema  Visit Diagnosis: Lymphedema, not elsewhere classified  Stiffness of left shoulder, not elsewhere classified     Problem List Patient Active Problem List   Diagnosis Date Noted  . Genetic testing 06/21/2019  . Family history of breast cancer   . Family history  of pancreatic cancer   . Family history of uterine cancer   .  Family history of lymphoma   . Family history of lung cancer   . Bone metastases (HCC) 06/02/2019  . Recurrent cancer of left breast (HCC) 05/15/2019  . Pes anserine bursitis 03/31/2019  . HTN (hypertension) 02/10/2019  . Hyperglycemia 06/21/2018  . Left carpal tunnel syndrome 02/22/2018  . Rotator cuff arthropathy of left shoulder 09/20/2017  . Cervical radiculitis 04/09/2017  . Left shoulder pain 04/09/2017  . Dyspnea on exertion 05/14/2016  . 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 Lymphedema of left arm 01/14/2016  . Left arm swelling 12/25/2015  . SVT (supraventricular tachycardia) (HCC)   . Left-sided carotid artery disease (HCC) 03/08/2015  . Dizziness 01/24/2015  . Urinary frequency 01/13/2015  . Dizziness and giddiness 01/09/2015  . Gallstones 02/21/2013  . Malignant neoplasm of upper-inner quadrant of left breast in female, estrogen receptor positive (HCC) 11/28/2012  . Muscle cramping 09/12/2012  . Right hip pain 09/12/2012  . Lower back pain 09/12/2012  . COPD GOLD I    . Hx of radiation therapy   . Right shoulder pain 09/14/2011  . Preventative health care 07/29/2010  . Skin lesion of left leg 07/29/2010  . Paresthesia 07/29/2010  . GERD 03/28/2010  . CONSTIPATION 03/28/2010  . Osteoarthrosis, 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  . Anxiety state 05/03/2009  . Hyperlipidemia 06/14/2007  . FATIGUE 06/14/2007  . 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 DIVERTICULOSIS, COLON 12/17/2006  . Cough 12/17/2006  . IRRITABLE BOWEL SYNDROME, HX OF 12/17/2006    Blaire Breedlove Blue 10/30/2019, 2:58 PM  [facility] [address] [place], [place], [address] Phone: [phone]   Fax:  [phone]  Name: Anvita C Bienvenue MRN: [id number] Date of Birth: 11/25/1940  Blaire Breedlove Blue, PT 10/30/19 2:58 PM   

## 2019-11-01 ENCOUNTER — Other Ambulatory Visit: Payer: Self-pay

## 2019-11-01 ENCOUNTER — Ambulatory Visit: Payer: Medicare Other | Admitting: Physical Therapy

## 2019-11-01 DIAGNOSIS — M25612 Stiffness of left shoulder, not elsewhere classified: Secondary | ICD-10-CM

## 2019-11-01 DIAGNOSIS — I890 Lymphedema, not elsewhere classified: Secondary | ICD-10-CM

## 2019-11-01 DIAGNOSIS — M25512 Pain in left shoulder: Secondary | ICD-10-CM

## 2019-11-01 NOTE — Therapy (Signed)
[place], [place], [address] Phone: [phone]   Fax:  [phone]  Physical Therapy Treatment  Patient Details  Name: Stacey [name] MRN: [id number] Date of Birth: 03/13/1940 Referring Provider (PT): Lurline Del MD   Encounter Date: 11/01/2019   PT End of Session - 11/01/19 1611    Visit Number 13    Number of Visits 17    Date for PT Re-Evaluation 11/01/19    PT Start Time 1500    PT Stop Time 1555    PT Time Calculation (min) 55 min    Activity Tolerance Patient tolerated treatment well    Behavior During Therapy [facility] for tasks assessed/performed           Past Medical History:  Diagnosis Date  . ANEMIA-NOS   . ANXIETY   . Breast cancer ([place]) 1997 L, 2012 R   s/p chemo/xrt  . COPD    resolved  . DIVERTICULOSIS, COLON 2008  . Dizziness   . Family history of breast cancer   . Family history of lung cancer   . Family history of lymphoma   . Family history of pancreatic cancer   . Family history of uterine cancer   . GERD   . Hx of radiation therapy 10/19/11 -12/03/11   right breast  . HYPERLIPIDEMIA   . IRRITABLE BOWEL SYNDROME, HX OF   . Left-sided carotid artery disease (HCC)    moderate left ICA stenosis  . OSTEOARTHRITIS, HAND   . PSVT (paroxysmal supraventricular tachycardia) (HCC)    symptomatic on event monitor    Past Surgical History:  Procedure Laterality Date  . ABDOMINAL HYSTERECTOMY    . APPENDECTOMY    . BREAST BIOPSY  11/14/10    r breast: inv, insitu mammary carcinoma w/calcif, er/pr +, her2 -  . BREAST SURGERY     lumpectomy  . CATARACT EXTRACTION     both eyes  . ELECTROPHYSIOLOGIC STUDY N/A 05/10/2015   Procedure: SVT Ablation;  Surgeon: Will Meredith Leeds, MD;  Location: [place] [facility];  Service: Cardiovascular;  Laterality: N/A;  . HERNIA REPAIR    . inguinal herniorrhapy  1984   left  . IR US GUIDE BX ASP/DRAIN  05/26/2019  . rectal fissure  repair    . s/p benign breast biopsy  2003   right  . s/p left foot surgury  2009  . 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 spiral fx left foot  2008   no surgury  . TMJ ARTHROPLASTY  1989  . [place]- to remove scar tissue growth   . TONSILLECTOMY      There were no vitals filed for this visit.   Subjective Assessment - 11/01/19 1610    Subjective Pt will go over to [facility] and pick up her Festus Aloe this afternoon    Pertinent History Hx breast cancer with 15 lymph node removal on the L and Bil lumpectomy. Lumpectomy on the L in 1997and the R in 2012. Radiation Bil.  New discovery of metastatic breat and is trying to find a treatment she can tolerate    Patient Stated Goals to get some relief  for her arm    Currently in Pain? No/denies                       Outpatient Re[facility] from [facility]/31/[facility]  Lymphedema Life Impact Scale Total Score 61.76 %            [facility] Adult PT Treatment/Exercise - 11/01/19 0001      Manual Therapy   Manual Therapy Edema management;Manual Lymphatic Drainage (MLD);Passive ROM    Edema Management applied medium Tg soft to arm for comfort    Manual Lymphatic Drainage (MLD) In Supine: short neck, 5 diaphragmatic breaths, Rt axillary and Lt inguinal nodes, anterior inter-axillary and Lt axillo-inguinal anastomosis, then focused on Lt UE working from lateral upper arm to dorsal hand, then retacing all steps back to pathways, no wrist nerve pain today    Passive ROM PROM to L shoulder in to abduction and flexion to what pt could tolerate                       PT Long Term Goals - 10/25/19 1622      PT LONG TERM GOAL #1   Title Pt will report the pain in her left arm is decreased by 50%    Time 8    Period Weeks    Status On-going      PT LONG TERM GOAL #2   Title Patient to be properly fitted with appropriate compression garment to wear on daily  basis for home management.    Time 8    Status On-going      PT LONG TERM GOAL #3   Title Pt will report she knows how to manage her lymphedema at home    Time 8    Period Weeks    Status On-going      PT LONG TERM GOAL #4   Title Pt will have 90 degrees of left shoulder flexion and abduction so that she has an improved quality of  life    Time 8    Period Weeks    Status On-going      PT LONG TERM GOAL #5   Title Pt will improve LLIS to 30% or less within 4 weeks in order to demonstrate objective improvement in functional activitie.    Period Weeks    Status On-going                 Plan - 11/01/19 1612    Clinical Impression Statement Pt continues to have congestion in her left arm. She says she is not seeing any real progress in decreasing in size, but her arm does feel softer after treatment she hopes to keep it that way and hopes the Festus Aloe will help her with that. . If it helps her manage at home, she is considering stopping PT for a while knowing that she can return at a later time if she feels she needs it.    Personal Factors and Comorbidities Comorbidity 2    Comorbidities Bil radiation due to Bil breast cancer and lumpectomy, 15 lymph node removal on the L    Stability/Clinical Decision Making Stable/Uncomplicated    Rehab Potential Good    PT Frequency 2x / week    PT Duration 8 weeks    PT Treatment/Interventions Therapeutic activities;Therapeutic exercise;Iontophoresis 64m/ml Dexamethasone;Patient/family education;Manual techniques;Neuromuscular re-education    PT Next Visit Plan check to see if TFestus Aloewill allow her to manage her lymphedema on her own at home If so consider discharge, if not, recert next visit, MLD and gentle P/AROM to left shoulder. check compression garment once it arrives    Consulted and Agree with Plan  of Care Patient           Patient will benefit from skilled therapeutic intervention in order to improve the following  deficits and impairments:  Decreased range of motion, Pain, Increased edema  Visit Diagnosis: Stiffness of left shoulder, not elsewhere classified  Lymphedema, not elsewhere classified  Left shoulder pain, unspecified chronicity     Problem List Patient Active Problem List   Diagnosis Date Noted  . Genetic testing 06/21/2019  . Family history of breast cancer   . Family history of pancreatic cancer   . Family history of uterine cancer   . Family history of lymphoma   . Family history of lung cancer   . Bone metastases ([place]) 06/02/2019  . Recurrent cancer of left breast ([place]) 05/15/2019  . Pes anserine bursitis 03/31/2019  . HTN (hypertension) 02/10/2019  . Hyperglycemia 06/21/2018  . Left carpal tunnel syndrome 02/22/2018  . Rotator cuff arthropathy of left shoulder 09/20/2017  . Cervical radiculitis 04/09/2017  . Left shoulder pain 04/09/2017  . Dyspnea on exertion 05/14/2016  . 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 Lymphedema of left arm 01/14/2016  . Left arm swelling 12/25/2015  . SVT (supraventricular tachycardia) ([place])   . Left-sided carotid artery disease ([place]) 03/08/2015  . Dizziness 01/24/2015  . Urinary frequency 01/13/2015  . Dizziness and giddiness 01/09/2015  . Gallstones 02/21/2013  . Malignant neoplasm of upper-inner quadrant of left breast in female, estrogen receptor positive ([place]) 11/28/2012  . Muscle cramping 09/12/2012  . Right hip pain 09/12/2012  . Lower back pain 09/12/2012  . COPD GOLD I    . Hx of radiation therapy   . Right shoulder pain 09/14/2011  . Preventative health care 07/29/2010  . Skin lesion of left leg 07/29/2010  . Paresthesia 07/29/2010  . GERD 03/28/2010  . CONSTIPATION 03/28/2010  . Osteoarthrosis, 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  . Anxiety state 05/03/2009  . Hyperlipidemia 06/14/2007  . FATIGUE 06/14/2007  . 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 DIVERTICULOSIS, COLON 12/17/2006  . Cough 12/17/2006   . IRRITABLE BOWEL SYNDROME, HX OF 12/17/2006   Donato Heinz. Owens Shark PT  [place]o 11/01/2019, [place], [place], [address] Phone: [phone]   Fax:  [phone]  Name: Stacey [name] MRN: [id number] Date of Birth: 06-10-1940

## 2019-11-03 DIAGNOSIS — Z23 Encounter for immunization: Secondary | ICD-10-CM | POA: Diagnosis not present

## 2019-11-06 ENCOUNTER — Ambulatory Visit: Payer: Medicare Other | Attending: Oncology

## 2019-11-06 ENCOUNTER — Other Ambulatory Visit: Payer: Self-pay

## 2019-11-06 DIAGNOSIS — M25612 Stiffness of left shoulder, not elsewhere classified: Secondary | ICD-10-CM | POA: Diagnosis not present

## 2019-11-06 DIAGNOSIS — Z86000 Personal history of in-situ neoplasm of breast: Secondary | ICD-10-CM | POA: Diagnosis not present

## 2019-11-06 DIAGNOSIS — I890 Lymphedema, not elsewhere classified: Secondary | ICD-10-CM | POA: Diagnosis not present

## 2019-11-06 DIAGNOSIS — M25512 Pain in left shoulder: Secondary | ICD-10-CM | POA: Insufficient documentation

## 2019-11-06 NOTE — Therapy (Signed)
[place], [place], [address] Phone: [phone]   Fax:  [phone]  Physical Therapy Treatment  Patient Details  Name: Stacey [name] MRN: [id number] Date of Birth: [birth year] Referring Provider (PT): Lurline Del MD   Encounter Date: 11/06/2019   PT End of Session - 11/06/19 1505    Visit Number 14    Date for PT Re-Evaluation 11/01/19    PT Start Time 1355    PT Stop Time 1454    PT Time Calculation (min) 59 min    Activity Tolerance Patient tolerated treatment well    Behavior During Therapy [facility] for tasks assessed/performed           Past Medical History:  Diagnosis Date   ANEMIA-NOS    ANXIETY    Breast cancer ([place]) 1997 L, 2012 R   s/p chemo/xrt   COPD    resolved   DIVERTICULOSIS, COLON 2008   Dizziness    Family history of breast cancer    Family history of lung cancer    Family history of lymphoma    Family history of pancreatic cancer    Family history of uterine cancer    GERD    Hx of radiation therapy 10/19/11 -12/03/11   right breast   HYPERLIPIDEMIA    IRRITABLE BOWEL SYNDROME, HX OF    Left-sided carotid artery disease ([place])    moderate left ICA stenosis   OSTEOARTHRITIS, HAND    PSVT (paroxysmal supraventricular tachycardia) ([place])    symptomatic on event monitor    Past Surgical History:  Procedure Laterality Date   ABDOMINAL HYSTERECTOMY     APPENDECTOMY     BREAST BIOPSY  11/14/10    r breast: inv, insitu mammary carcinoma w/calcif, er/pr +, her2 -   BREAST SURGERY     lumpectomy   CATARACT EXTRACTION     both eyes   ELECTROPHYSIOLOGIC STUDY N/A 05/10/2015   Procedure: SVT Ablation;  Surgeon: Will Meredith Leeds, MD;  Location: [place] [facility];  Service: Cardiovascular;  Laterality: N/A;   HERNIA REPAIR     inguinal herniorrhapy  1984   left   IR US GUIDE BX ASP/DRAIN  05/26/2019   rectal fissure repair     s/p benign  breast biopsy  2003   right   s/p left foot surgury  2009   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   spiral fx left foot  2008   no surgury   TMJ ARTHROPLASTY  1989   TONGUE SURGERY     1988- to remove scar tissue growth    TONSILLECTOMY      There were no vitals filed for this visit.   Subjective Assessment - 11/06/19 1359    Subjective Pt fot tribute wrap last week.  Have only worn it once.  It is cumbersome and not easy to get on, and once on it is comfortable. Can't wear the hand piece and the arm piece.  My fingers have no feeling and it is hard to get them in the holes. Try to clasp the hand piece between my knees to get it on.  Its hard to see the hole for the thumb.    Pertinent History Hx breast cancer with 15 lymph node removal on the L and Bil lumpectomy. Lumpectomy on the L in 1997and the R in 2012. Radiation Bil.  New discovery of metastatic breat and is trying to find a treatment she  can tolerate    Currently in Pain? No/denies                       [facility]  Lymphedema Life Impact Scale Total Score 61.76 %            [facility] Adult PT Treatment/Exercise - 11/06/19 0001      Manual Therapy   Manual Therapy Edema management;Manual Lymphatic Drainage (MLD);Passive ROM    Manual Lymphatic Drainage (MLD) In Supine: short neck, 5 diaphragmatic breaths, Rt axillary and Lt inguinal nodes, anterior inter-axillary and Lt axillo-inguinal anastomosis, then focused on Lt UE working from lateral upper arm to dorsal hand, then retacing all steps back to pathways, no wrist nerve pain today    Passive ROM PROM to L shoulder in to abduction and flexion to what pt could tolerate                  PT Education - 11/06/19 1500    Education Details Pt advised to bring tribute next visit to see if we can problem solve an easier way for pt. to don.  Asked pt to try wearing today and  tomorrow to assess comfort and swelling    Person(s) Educated Patient    Methods Explanation    Comprehension Verbalized understanding               PT Long Term Goals - 10/25/19 1622      PT LONG TERM GOAL #1   Title Pt will report the pain in her left arm is decreased by 50%    Time 8    Period Weeks    Status On-going      PT LONG TERM GOAL #2   Title Patient to be properly fitted with appropriate compression garment to wear on daily basis for home management.    Time 8    Status On-going      PT LONG TERM GOAL #3   Title Pt will report she knows how to manage her lymphedema at home    Time 8    Period Weeks    Status On-going      PT LONG TERM GOAL #4   Title Pt will have 90 degrees of left shoulder flexion and abduction so that she has an improved quality of  life    Time 8    Period Weeks    Status On-going      PT LONG TERM GOAL #5   Title Pt will improve LLIS to 30% or less within 4 weeks in order to demonstrate objective improvement in functional activitie.    Period Weeks    Status On-going                 Plan - 11/06/19 1507    Clinical Impression Statement Pt received tribute last week but has only worn 1 time.  She has difficulty donning and would like to return for 1 more visit for assist with donning techniques.She had no increased pain with shoulder ROM or MLD.  Hand felt better after MLD.  She will be ready for DC after Wed. visit.    Personal Factors and Comorbidities Comorbidity 2    Comorbidities Bil radiation due to Bil breast cancer and lumpectomy, 15 lymph node removal on the L    Stability/Clinical Decision Making Stable/Uncomplicated    Rehab Potential Good    PT Duration 8 weeks  PT Treatment/Interventions Therapeutic activities;Therapeutic exercise;Iontophoresis 32m/ml Dexamethasone;Patient/family education;Manual techniques;Neuromuscular re-education    PT Next Visit Plan Problem solve with pt easier donning techniques for  tribute. She will bring next visit.  Feels she will be ready for DC after next visit. Continue PROM, MLD prn    Consulted and Agree with Plan of Care Patient           Patient will benefit from skilled therapeutic intervention in order to improve the following deficits and impairments:  Decreased range of motion, Pain, Increased edema  Visit Diagnosis: Stiffness of left shoulder, not elsewhere classified  Left shoulder pain, unspecified chronicity  Lymphedema, not elsewhere classified     Problem List Patient Active Problem List   Diagnosis Date Noted   Genetic testing 06/21/2019   Family history of breast cancer    Family history of pancreatic cancer    Family history of uterine cancer    Family history of lymphoma    Family history of lung cancer    [place] ([place] 06/02/[place]   Recurrent cancer of left breast ([place] 05/15/2019   Pes anserine bursitis 03/31/2019   HTN (hypertension) 02/10/2019   Hyperglycemia 06/21/2018   Left carpal tunnel syndrome 02/22/2018   Rotator cuff arthropathy of left shoulder 09/20/2017   Cervical radiculitis 04/09/2017   Left shoulder pain 04/09/2017   Dyspnea on exertion 05/14/2016   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   Lymphedema of left arm 01/14/2016   Left arm swelling 12/25/2015   SVT (supraventricular tachycardia) (HCC)    Left-sided carotid artery disease ([place] 03/08/2015   Dizziness 01/24/2015   Urinary frequency 01/13/2015   Dizziness and giddiness 01/09/2015   Gallstones 02/21/2013   Malignant neoplasm of upper-inner quadrant of left breast in female, estrogen receptor positive ([place] 11/28/2012   Muscle cramping 09/12/2012   Right hip pain 09/12/2012   Lower back pain 09/12/2012   COPD GOLD I     Hx of radiation therapy    Right shoulder pain 09/14/2011   Preventative health care 07/29/2010   Skin lesion of left leg 07/29/2010   Paresthesia 07/29/2010    GERD 03/28/2010   CONSTIPATION 03/28/2010   Osteoarthrosis, 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   Anxiety state 05/03/2009   Hyperlipidemia 06/14/2007   FATIGUE 06/14/2007   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

## 2019-11-08 ENCOUNTER — Ambulatory Visit: Payer: Medicare Other | Admitting: Physical Therapy

## 2019-11-08 ENCOUNTER — Other Ambulatory Visit: Payer: Self-pay

## 2019-11-08 ENCOUNTER — Encounter: Payer: Self-pay | Admitting: Physical Therapy

## 2019-11-08 DIAGNOSIS — I890 Lymphedema, not elsewhere classified: Secondary | ICD-10-CM | POA: Diagnosis not present

## 2019-11-08 DIAGNOSIS — Z86000 Personal history of in-situ neoplasm of breast: Secondary | ICD-10-CM

## 2019-11-08 DIAGNOSIS — M25512 Pain in left shoulder: Secondary | ICD-10-CM | POA: Diagnosis not present

## 2019-11-08 DIAGNOSIS — M25612 Stiffness of left shoulder, not elsewhere classified: Secondary | ICD-10-CM

## 2019-11-08 NOTE — Therapy (Signed)
[place], [place], [address] Phone: [phone]   Fax:  [phone]  Physical Therapy Treatment  Patient Details  Name: Stacey [name] MRN: [id number] Date of Birth: 1940/09/29 Referring Provider (PT): Lurline Del MD   Encounter Date: 11/08/2019   PT End of Session - 11/08/19 1658    Visit Number 15    Number of Visits 17    Date for PT Re-Evaluation 11/01/19    PT Start Time 9509    PT Stop Time 3267    PT Time Calculation (min) 47 min    Activity Tolerance Patient tolerated treatment well    Behavior During Therapy [facility] for tasks assessed/performed           Past Medical History:  Diagnosis Date   ANEMIA-NOS    ANXIETY    Breast cancer ([place]) 1997 L, 2012 R   s/p chemo/xrt   COPD    resolved   DIVERTICULOSIS, COLON 2008   Dizziness    Family history of breast cancer    Family history of lung cancer    Family history of lymphoma    Family history of pancreatic cancer    Family history of uterine cancer    GERD    Hx of radiation therapy 10/19/11 -12/03/11   right breast   HYPERLIPIDEMIA    IRRITABLE BOWEL SYNDROME, HX OF    Left-sided carotid artery disease ([place])    moderate left ICA stenosis   OSTEOARTHRITIS, HAND    PSVT (paroxysmal supraventricular tachycardia) ([place])    symptomatic on event monitor    Past Surgical History:  Procedure Laterality Date   ABDOMINAL HYSTERECTOMY     APPENDECTOMY     BREAST BIOPSY  11/14/10    r breast: inv, insitu mammary carcinoma w/calcif, er/pr +, her2 -   BREAST SURGERY     lumpectomy   CATARACT EXTRACTION     both eyes   ELECTROPHYSIOLOGIC STUDY N/A 05/10/2015   Procedure: SVT Ablation;  Surgeon: Will Meredith Leeds, MD;  Location: [facility];  Service: Cardiovascular;  Laterality: N/A;   HERNIA REPAIR     inguinal herniorrhapy  1984   left   IR US GUIDE BX ASP/DRAIN  05/26/2019   rectal fissure  repair     s/p benign breast biopsy  2003   right   s/p left foot surgury  2009   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   spiral fx left foot  2008   no surgury   TMJ ARTHROPLASTY  1989   TONGUE SURGERY     1988- to remove scar tissue growth    TONSILLECTOMY      There were no vitals filed for this visit.   Subjective Assessment - 11/08/19 1608    Subjective I brought in my garment so you could take a look at it.    Pertinent History Hx breast cancer with 15 lymph node removal on the L and Bil lumpectomy. Lumpectomy on the L in 1997and the R in 2012. Radiation Bil.  New discovery of metastatic breat and is trying to find a treatment she can tolerate    Patient Stated Goals to get some relief  for her arm    Currently in Pain? No/denies    Pain Score 0-No pain                 LYMPHEDEMA/ONCOLOGY QUESTIONNAIRE - 11/08/19 0001  Left Upper Extremity Lymphedema   15 cm Proximal to Olecranon Process 33.2 cm    10 cm Proximal to Olecranon Process 35.5 cm    Olecranon Process 34.5 cm    15 cm Proximal to Ulnar Styloid Process 32.1 cm    10 cm Proximal to Ulnar Styloid Process 26.1 cm    Just Proximal to Ulnar Styloid Process 19.4 cm    Across Hand at [employer] 19 cm    At [place] of 2nd Digit 7 cm                Outpatient Rehab from 11/08/[facility]  Lymphedema Life Impact Scale Total Score 39.71 %            [facility] Adult PT Treatment/Exercise - 11/08/19 0001      Manual Therapy   Edema Management remeasured circumferences; educated pt on different donning/doffing techniques for garment and assessed garment for fit - see assessment                       PT Long Term Goals - 11/08/19 1623      PT LONG TERM GOAL #1   Title Pt will report the pain in her left arm is decreased by 50%    Baseline 11/08/19- no reports that her pain goes up and down, it is not consistent    Time 8     Period Weeks    Status Not Met      PT LONG TERM GOAL #2   Title Patient to be properly fitted with appropriate compression garment to wear on daily basis for home management.    Baseline pt is wearing an old sleeve and TG soft at this time; 11/08/19- pt received tribute wrap compression sleeve and glove    Time 8    Period Weeks    Status Achieved      PT LONG TERM GOAL #3   Title Pt will report she knows how to manage her lymphedema at home    Baseline 11/08/19-pt is able to do this    Time 8    Period Weeks    Status Achieved      PT LONG TERM GOAL #4   Title Pt will have 90 degrees of left shoulder flexion and abduction so that she has an improved quality of  life    Baseline 11/08/19- pt is able to do this    Time 8    Period Weeks    Status Achieved      PT LONG TERM GOAL #5   Title Pt will improve LLIS to 30% or less within 4 weeks in order to demonstrate objective improvement in functional activitie.    Baseline 11/08/19- 39.71    Time 8    Period Weeks    Status Partially Met                 Plan - 11/08/19 1655    Clinical Impression Statement Assessed pt's progress towards goals in therapy. Pt was able to don her arm piece independently but when therapist checked it was not tight enough and the back of her arm was not covered by the tribute. Therapist tightened the straps and then had pt try to remove. Pt was unable to remove so therapist removed the thicker straps and pt was unable to don/doff the tribute with small straps intact and then she was able to tighten the thicker straps.  Pt was able to don glove independently. Educated pt to try and wear glove overnight with the wrap. Remeasured circumferences today and pt demonstrates significant decrease throughout LUE and especially at UE since begining therapy. Pt is now ready for discharge from skilled PT services as she is able to independently manage her lymphedema.    PT Frequency 2x / week    PT Duration 8 weeks     PT Treatment/Interventions Therapeutic activities;Therapeutic exercise;Iontophoresis 33m/ml Dexamethasone;Patient/family education;Manual techniques;Neuromuscular re-education    PT Next Visit Plan d/c this visit    PT Home Exercise Plan Medbrideg access code: [email or web address]   Consulted and Agree with Plan of Care Patient           Patient will benefit from skilled therapeutic intervention in order to improve the following deficits and impairments:  Decreased range of motion, Pain, Increased edema  Visit Diagnosis: Lymphedema, not elsewhere classified  Left shoulder pain, unspecified chronicity  Stiffness of left shoulder, not elsewhere classified  History of ductal carcinoma in situ (DCIS) of breast     Problem List Patient Active Problem List   Diagnosis Date Noted   Genetic testing 06/21/2019   Family history of breast cancer    Family history of pancreatic cancer    Family history of uterine cancer    Family history of lymphoma    Family history of lung cancer    [place]one metastases ([place] 06/02/2019   Recurrent cancer of left breast ([place] 05/15/2019   Pes anserine bursitis 03/31/2019   HTN (hypertension) 02/10/2019   Hyperglycemia 06/21/2018   Left carpal tunnel syndrome 02/22/2018   Rotator cuff arthropathy of left shoulder 09/20/2017   Cervical radiculitis 04/09/2017   Left shoulder pain 04/09/2017   Dyspnea on exertion 05/14/2016   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   Lymphedema of left arm 01/14/2016   Left arm swelling 12/25/2015   SVT (supraventricular tachycardia) ([facility])    Left-sided carotid artery disease ([place] 03/08/2015   Dizziness 01/24/2015   Urinary frequency 01/13/2015   Dizziness and giddiness 01/09/2015   Gallstones 02/21/2013   Malignant neoplasm of upper-inner quadrant of left breast in female, estrogen receptor positive ([facility] 11/28/2012   Muscle cramping 09/12/2012   Right hip pain  09/12/2012   Lower back pain 09/12/2012   COPD GOLD I     Hx of radiation therapy    Right shoulder pain 09/14/2011   Preventative health care 07/29/2010   Skin lesion of left leg 07/29/2010   Paresthesia 07/29/2010   GERD 03/28/2010   CONSTIPATION 03/28/2010   Osteoarthrosis, 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   Anxiety state 05/03/2009   Hyperlipidemia 06/14/2007   FATIGUE 06/14/2007   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  Patient goals were partially met. Patient is being discharged due to meeting the stated rehab goals.  ?????    BAllyson Sabal  Blue, PT 11/08/19 5:01 PM

## 2019-11-09 ENCOUNTER — Ambulatory Visit (HOSPITAL_COMMUNITY)
Admission: RE | Admit: 2019-11-09 | Discharge: 2019-11-09 | Disposition: A | Discharge status: Home / Self Care | Payer: Medicare Other | Source: Ambulatory Visit | Attending: Oncology | Admitting: Oncology

## 2019-11-09 DIAGNOSIS — Z170 Estrogen receptor positive status [ER+]: Secondary | ICD-10-CM | POA: Insufficient documentation

## 2019-11-09 DIAGNOSIS — C50919 Malignant neoplasm of unspecified site of unspecified female breast: Secondary | ICD-10-CM | POA: Diagnosis not present

## 2019-11-09 DIAGNOSIS — C50912 Malignant neoplasm of unspecified site of left female breast: Secondary | ICD-10-CM | POA: Diagnosis not present

## 2019-11-09 DIAGNOSIS — J439 Emphysema, unspecified: Secondary | ICD-10-CM | POA: Diagnosis not present

## 2019-11-09 DIAGNOSIS — C50212 Malignant neoplasm of upper-inner quadrant of left female breast: Secondary | ICD-10-CM | POA: Diagnosis not present

## 2019-11-09 DIAGNOSIS — K449 Diaphragmatic hernia without obstruction or gangrene: Secondary | ICD-10-CM | POA: Diagnosis not present

## 2019-11-09 DIAGNOSIS — R918 Other nonspecific abnormal finding of lung field: Secondary | ICD-10-CM | POA: Diagnosis not present

## 2019-11-09 DIAGNOSIS — C7951 Secondary malignant neoplasm of bone: Secondary | ICD-10-CM | POA: Diagnosis not present

## 2019-11-09 MED ORDER — IOHEXOL 300 MG/ML  SOLN
75.0000 mL | Freq: Once | INTRAMUSCULAR | Status: AC | PRN
  Administered 2019-11-09: 75 mL via INTRAVENOUS

## 2019-11-09 NOTE — Addendum Note (Signed)
Addended by: Arbutus Ped C on: 11/09/2019 12:17 PM   Modules accepted: Orders

## 2019-11-10 ENCOUNTER — Other Ambulatory Visit: Payer: Self-pay | Admitting: Oncology

## 2019-11-10 NOTE — Progress Notes (Signed)
I called Stacey [name] to discuss results of the CT scan which shows evidence of mild progression.  I am going to add Faslodex.  I left her message as she was not available

## 2019-11-15 ENCOUNTER — Other Ambulatory Visit: Payer: Self-pay | Admitting: Pharmacist

## 2019-11-15 ENCOUNTER — Inpatient Hospital Stay: Payer: Medicare Other | Attending: Adult Health | Admitting: Oncology

## 2019-11-15 ENCOUNTER — Other Ambulatory Visit: Payer: Self-pay

## 2019-11-15 ENCOUNTER — Inpatient Hospital Stay: Payer: Medicare Other

## 2019-11-15 VITALS — BP 171/68 | HR 84 | Temp 97.5°F | Resp 16 | Ht 63.75 in | Wt 162.1 lb

## 2019-11-15 DIAGNOSIS — M8580 Other specified disorders of bone density and structure, unspecified site: Secondary | ICD-10-CM | POA: Diagnosis not present

## 2019-11-15 DIAGNOSIS — Z79899 Other long term (current) drug therapy: Secondary | ICD-10-CM | POA: Insufficient documentation

## 2019-11-15 DIAGNOSIS — Z803 Family history of malignant neoplasm of breast: Secondary | ICD-10-CM | POA: Diagnosis not present

## 2019-11-15 DIAGNOSIS — C50211 Malignant neoplasm of upper-inner quadrant of right female breast: Secondary | ICD-10-CM

## 2019-11-15 DIAGNOSIS — R232 Flushing: Secondary | ICD-10-CM | POA: Diagnosis not present

## 2019-11-15 DIAGNOSIS — Z8249 Family history of ischemic heart disease and other diseases of the circulatory system: Secondary | ICD-10-CM | POA: Insufficient documentation

## 2019-11-15 DIAGNOSIS — K760 Fatty (change of) liver, not elsewhere classified: Secondary | ICD-10-CM | POA: Insufficient documentation

## 2019-11-15 DIAGNOSIS — I10 Essential (primary) hypertension: Secondary | ICD-10-CM | POA: Insufficient documentation

## 2019-11-15 DIAGNOSIS — Z8049 Family history of malignant neoplasm of other genital organs: Secondary | ICD-10-CM | POA: Insufficient documentation

## 2019-11-15 DIAGNOSIS — Z5111 Encounter for antineoplastic chemotherapy: Secondary | ICD-10-CM | POA: Diagnosis not present

## 2019-11-15 DIAGNOSIS — Z87891 Personal history of nicotine dependence: Secondary | ICD-10-CM | POA: Insufficient documentation

## 2019-11-15 DIAGNOSIS — Z823 Family history of stroke: Secondary | ICD-10-CM | POA: Diagnosis not present

## 2019-11-15 DIAGNOSIS — J439 Emphysema, unspecified: Secondary | ICD-10-CM | POA: Insufficient documentation

## 2019-11-15 DIAGNOSIS — C773 Secondary and unspecified malignant neoplasm of axilla and upper limb lymph nodes: Secondary | ICD-10-CM | POA: Diagnosis not present

## 2019-11-15 DIAGNOSIS — C50912 Malignant neoplasm of unspecified site of left female breast: Secondary | ICD-10-CM | POA: Diagnosis not present

## 2019-11-15 DIAGNOSIS — C50212 Malignant neoplasm of upper-inner quadrant of left female breast: Secondary | ICD-10-CM | POA: Diagnosis not present

## 2019-11-15 DIAGNOSIS — Z801 Family history of malignant neoplasm of trachea, bronchus and lung: Secondary | ICD-10-CM | POA: Insufficient documentation

## 2019-11-15 DIAGNOSIS — Z8371 Family history of colonic polyps: Secondary | ICD-10-CM | POA: Insufficient documentation

## 2019-11-15 DIAGNOSIS — Z808 Family history of malignant neoplasm of other organs or systems: Secondary | ICD-10-CM | POA: Diagnosis not present

## 2019-11-15 DIAGNOSIS — K449 Diaphragmatic hernia without obstruction or gangrene: Secondary | ICD-10-CM | POA: Insufficient documentation

## 2019-11-15 DIAGNOSIS — Z170 Estrogen receptor positive status [ER+]: Secondary | ICD-10-CM

## 2019-11-15 DIAGNOSIS — Z8043 Family history of malignant neoplasm of testis: Secondary | ICD-10-CM | POA: Insufficient documentation

## 2019-11-15 DIAGNOSIS — E785 Hyperlipidemia, unspecified: Secondary | ICD-10-CM | POA: Diagnosis not present

## 2019-11-15 DIAGNOSIS — C7951 Secondary malignant neoplasm of bone: Secondary | ICD-10-CM

## 2019-11-15 DIAGNOSIS — Z833 Family history of diabetes mellitus: Secondary | ICD-10-CM | POA: Diagnosis not present

## 2019-11-15 DIAGNOSIS — R9389 Abnormal findings on diagnostic imaging of other specified body structures: Secondary | ICD-10-CM

## 2019-11-15 DIAGNOSIS — C50911 Malignant neoplasm of unspecified site of right female breast: Secondary | ICD-10-CM

## 2019-11-15 LAB — COMPREHENSIVE METABOLIC PANEL
ALT: 15 U/L (ref 0–44)
AST: 25 U/L (ref 15–41)
Albumin: 3.4 g/dL — ABNORMAL LOW (ref 3.5–5.0)
Alkaline Phosphatase: 54 U/L (ref 38–126)
Anion gap: 8 (ref 5–15)
BUN: 12 mg/dL (ref 8–23)
CO2: 26 mmol/L (ref 22–32)
Calcium: 8.1 mg/dL — ABNORMAL LOW (ref 8.9–10.3)
Chloride: 109 mmol/L (ref 98–111)
Creatinine, Ser: 1.06 mg/dL — ABNORMAL HIGH (ref 0.44–1.00)
GFR, Estimated: 50 mL/min — ABNORMAL LOW (ref >60)
Glucose, Bld: 100 mg/dL — ABNORMAL HIGH (ref 70–99)
Potassium: 3.1 mmol/L — ABNORMAL LOW (ref 3.5–5.1)
Sodium: 143 mmol/L (ref 135–145)
Total Bilirubin: 0.4 mg/dL (ref 0.3–1.2)
Total Protein: 6.7 g/dL (ref 6.5–8.1)

## 2019-11-15 LAB — CBC WITH DIFFERENTIAL/PLATELET
Abs Immature Granulocytes: 0.01 10*3/uL (ref 0.00–0.07)
Basophils Absolute: 0.1 10*3/uL (ref 0.0–0.1)
Basophils Relative: 2 %
Eosinophils Absolute: 0.1 10*3/uL (ref 0.0–0.5)
Eosinophils Relative: 2 %
HCT: 28.7 % — ABNORMAL LOW (ref 36.0–46.0)
Hemoglobin: 10.1 g/dL — ABNORMAL LOW (ref 12.0–15.0)
Immature Granulocytes: 0 %
Lymphocytes Relative: 23 %
Lymphs Abs: 0.9 10*3/uL (ref 0.7–4.0)
MCH: 33.3 pg (ref 26.0–34.0)
MCHC: 35.2 g/dL (ref 30.0–36.0)
MCV: 94.7 fL (ref 80.0–100.0)
Monocytes Absolute: 0.5 10*3/uL (ref 0.1–1.0)
Monocytes Relative: 14 %
Neutro Abs: 2.3 10*3/uL (ref 1.7–7.7)
Neutrophils Relative %: 59 %
Platelets: 205 10*3/uL (ref 150–400)
RBC: 3.03 MIL/uL — ABNORMAL LOW (ref 3.87–5.11)
RDW: 16 % — ABNORMAL HIGH (ref 11.5–15.5)
WBC: 3.8 10*3/uL — ABNORMAL LOW (ref 4.0–10.5)
nRBC: 0 % (ref 0.0–0.2)

## 2019-11-15 MED ORDER — PALBOCICLIB 100 MG PO CAPS: ORAL_CAPSULE | ORAL | 6 refills | Status: DC

## 2019-11-15 MED ORDER — PALBOCICLIB 100 MG PO TABS: ORAL_TABLET | ORAL | 6 refills | Status: DC

## 2019-11-15 NOTE — Progress Notes (Signed)
Oral Oncology Pharmacist Encounter  Prescription refill for Ibrance sent to [facility] in error. Patient enrolled in manufacturer assistance and receives medication through [employer] together patient assistance program. Prescription redirected to [facility].  Leron Croak, PharmD, BCPS Hematology/Oncology Clinical Pharmacist [facility] [phone] 11/15/2019 3:51 PM

## 2019-11-15 NOTE — Progress Notes (Signed)
ID: [name]   DOB: September 29, 1940  MR#: [id number]  CSN#:[id number]  Patient Care Team: Biagio Borg, MD as PCP - General (Internal Medicine) Tyler Pita, MD as Consulting Physician (Radiation Oncology) Roseanne Kaufman, MD as Consulting Physician (Orthopedic Surgery) Lyndal Pulley, DO as Consulting Physician (Family Medicine) Cigi Bega, Virgie Dad, MD as Consulting Physician (Oncology) OTHER MD:   CHIEF COMPLAINT: metastatic breast cancer  CURRENT TREATMENT: anastrozole; palbociclib/Ibrance; denosumab/Xgeva every 12 weeks; fulvestrant   INTERVAL HISTORY:   Deanna returns today for follow up of her now metastatic breast cancer.  Since her last visit, she underwent restaging chest CT on 11/09/2019 showing: slight increase in size of left axillary mass/lymphadenopathy; no other sites of progressive disease identified.  She started her anastrozole 06/02/2019.  Does not have significant problems from this medication and particularly hot flashes and vaginal dryness are not an issue for her  She continues on palbociclib, currently at 125 mg Monday Wednesday and Friday which she does for 3 weeks and then has a week off.  She is having no side effects from this that she is aware of.  She is also on denosumab/Xgeva, with her most recent dose on 09/28/2019.  This is repeated every 12 weeks  We are following her CA 27-29: Lab Results  Component Value Date   CA2729 48.5 (H) 10/18/2019   CA2729 51.5 (H) 09/11/2019   CA2729 66.5 (H) 08/02/2019   CA2729 79.7 (H) 07/06/2019   CA2729 107.4 (H) 06/08/2019    REVIEW OF SYSTEMS: Enriqueta is tolerating treatment well.  Her goal is to be not only allowed but in good shape a year from now when her granddaughter will get married and what Nimrat says is going to be in a normal as event.  She is concerned that she had been scheduled here on a wrong date and she wants to make sure her schedule is cleared up.  Even though hot flashes are generally not an issue  she had one hot flash that she says lasted about 5 minutes and it was "the worst in her life".  It is not clear what was going on at that time.  That event has not recurred.  Otherwise a detailed review of systems today was stable   HISTORY OF PRESENT ILLNESS:  From the original intake note:  " Daron Breeding is a 79 year old [place] woman, who has a history of breast cancer starting in 1997 and in 2013.    On 11/22/95, she underwent a left breast biopsy, which revealed malignant cells.  She had a left breast lumpectomy with re-excision on 11/29/95, with final pathology revealed IDC, grade 2, 2.5 cm, ER 98%, PR 97%, Ki67 8% with one of 15 nodes being positive.  She received chemotherapy, 4 cycles per patient, along with radiation and took Tamoxifen for 7 years following radiation completion.  Records from 1997 limited.    In October 2012, she had a biopsy of the right breast after mammography recommended additional images for a dense area in the right breast.  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  On 11/25/10, she had an MRI that measured the area in the right breast as 2.2 cm.  She started neoadjuvant [name] in November 2012 and has continued it since.  The area of concern measured 1.7 cm on a repeat MRI in March of 2013.  On  09/03/11, she underwent a right lumpectomy with sentinel lymph node biopsy with final pathology resulting invasive lobular carcinoma with calcifications, grade 2, 2.5 cm wit lobular carcinoma in situ.  Surgical margins were negative with one of one sentinel lymph node positive for metastatic carcinoma.  She then underwent radiation therapy under the care of Dr. [name] from 10/19/11 to 12/03/11. "   Her subsequent history is as detailed below   PAST MEDICAL HISTORY: Past Medical History:  Diagnosis Date  . ANEMIA-NOS   . ANXIETY   . Breast  cancer ([facility]) 1997 L, 2012 R   s/p chemo/xrt  . COPD    resolved  . DIVERTICULOSIS, COLON 2008  . Dizziness   . Family history of breast cancer   . Family history of lung cancer   . Family history of lymphoma   . Family history of pancreatic cancer   . Family history of uterine cancer   . GERD   . Hx of radiation therapy 10/19/11 -12/03/11   right breast  . HYPERLIPIDEMIA   . IRRITABLE BOWEL SYNDROME, HX OF   . Left-sided carotid artery disease (HCC)    moderate left ICA stenosis  . OSTEOARTHRITIS, HAND   . PSVT (paroxysmal supraventricular tachycardia) ([facility])    symptomatic on event monitor    PAST SURGICAL HISTORY: Past Surgical History:  Procedure Laterality Date  . ABDOMINAL HYSTERECTOMY    . APPENDECTOMY    . BREAST BIOPSY  11/14/10    r breast: inv, insitu mammary carcinoma w/calcif, er/pr +, her2 -  . BREAST SURGERY     lumpectomy  . CATARACT EXTRACTION     both eyes  . ELECTROPHYSIOLOGIC STUDY N/A 05/10/2015   Procedure: SVT Ablation;  Surgeon: Will Meredith Leeds, MD;  Location: [facility];  Service: Cardiovascular;  Laterality: N/A;  . HERNIA REPAIR    . inguinal herniorrhapy  1984   left  . IR US GUIDE BX ASP/DRAIN  05/26/2019  . rectal fissure repair    . s/p benign breast biopsy  2003   right  . s/p left foot surgury  2009  . 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 spiral fx left foot  2008   no surgury  . TMJ ARTHROPLASTY  1989  . [place]- to remove scar tissue growth   . TONSILLECTOMY      FAMILY HISTORY Family History  Problem Relation Age of Onset  . Hypertension Mother   . Stroke Mother   . Colon polyps Mother   . Diabetes Mother   . Pancreatic cancer Mother 26  . Uterine cancer Mother 38  . Lymphoma Brother        burkitts  . Lung cancer Paternal Uncle   . Lung cancer Maternal Grandmother 62       non-smoker  . Lung cancer Maternal Grandfather   . Breast cancer Cousin        maternal cousin, dx in her mid  21s  . Brain cancer Cousin        maternal cousin's son; dx in his 44s  . Testicular cancer Cousin        maternal cousin's son;   . Breast cancer Cousin        paternal cousin; dx in her 55s  . Breast cancer Cousin        paternal cousin's daughter; dx in 10s; neg genetic testing  . Colon cancer Neg Hx   .  Esophageal cancer Neg Hx   . Stomach cancer Neg Hx   . Rectal cancer Neg Hx     GYNECOLOGIC HISTORY:   Menarche age 85, GX P0 (one miscarriage at 5 months). Status post vaginal hysterectomy in the late 1970s, no salpingo-oophorectomy    SOCIAL HISTORY:   The patient lives alone and is divorced. She retired from [employer], were she worked in Physiological scientist.Marland Kitchen     ADVANCED DIRECTIVES:  Living will in place; the patient's healthcare power of attorney is her stepdaughter, Vladimir Faster, who can be reached at [place]: Social History   Tobacco Use  . Smoking status: Former Smoker    Packs/day: 1.00    Years: 54.00    Pack years: 54.00    Types: Cigarettes    Quit date: 01/03/2016    Years since quitting: 3.8  . Smokeless tobacco: Never Used  Vaping Use  . Vaping Use: Never used  Substance Use Topics  . Alcohol use: Yes    Comment: rare/ drinks socially  . Drug use: No    Allergies  Allergen Reactions  . Bee Venom Swelling  . Clindamycin/Lincomycin Diarrhea and Nausea And Vomiting  . Codeine Nausea And Vomiting    Current Outpatient Medications  Medication Sig Dispense Refill  . anastrozole (ARIMIDEX) 1 MG tablet Take 1 tablet (1 mg total) by mouth daily. 90 tablet 4  . aspirin 81 MG EC tablet Take 81 mg by mouth daily.      . calcium-vitamin D (OSCAL WITH D) 500-200 MG-UNIT per tablet Take 1 tablet by mouth daily.    . cholecalciferol (VITAMIN D) 1000 UNITS tablet Take 1,000 Units by mouth daily.      . cholestyramine (QUESTRAN) 4 g packet Take 1 packet (4 g total) by mouth 3 (three) times daily. 60 each 12  . clotrimazole-betamethasone  (LOTRISONE) cream Apply 1 application topically 2 (two) times daily as needed. 30 g 1  . diclofenac sodium (VOLTAREN) 1 % GEL Apply 4 g topically 4 (four) times daily as needed. 400 g 11  . diphenhydrAMINE (BENADRYL) 25 MG tablet Take 25 mg by mouth every 6 (six) hours as needed (for bee stings).     . fluticasone (FLONASE) 50 MCG/ACT nasal spray Place 2 sprays into both nostrils daily.    Marland Kitchen loratadine (CLARITIN) 10 MG tablet Take 10 mg by mouth daily.    . meloxicam (MOBIC) 15 MG tablet TAKE 1 TABLET BY MOUTH DAILY 90 tablet 1  . Multiple Vitamin (MULTIVITAMIN) capsule Take 1 capsule by mouth daily.      Marland Kitchen omeprazole (PRILOSEC) 20 MG capsule TAKE 1 CAPSULE(20 MG) BY MOUTH DAILY 90 capsule 3  . palbociclib (IBRANCE) 100 MG tablet Take 1 tablet (100 mg total) by mouth Monday through Friday for 3 weeks, then one week off, then repeat 15 tablet 6  . polyethylene glycol (MIRALAX / GLYCOLAX) packet take 1 packet once daily if needed for constipation 30 packet 11  . prochlorperazine (COMPAZINE) 10 MG tablet Take 1 tablet (10 mg total) by mouth every 6 (six) hours as needed for nausea or vomiting. 30 tablet 1  . pseudoephedrine (SUDAFED) 30 MG tablet Take 30 mg by mouth every 4 (four) hours as needed for congestion.     Marland Kitchen telmisartan (MICARDIS) 40 MG tablet Take 1 tablet (40 mg total) by mouth daily. 90 tablet 3  . Tiotropium Bromide Monohydrate (SPIRIVA RESPIMAT) 2.5 MCG/ACT AERS Inhale 2 puffs into the lungs daily. 4 g  11  . zolpidem (AMBIEN) 10 MG tablet TAKE 1 TABLET(10 MG) BY MOUTH AT BEDTIME AS NEEDED 90 tablet 1   No current facility-administered medications for this visit.    OBJECTIVE: White woman in no acute distress Vitals:   11/15/19 1510  BP: (!) 171/68  Pulse: 84  Resp: 16  Temp: (!) 97.5 F (36.4 C)  SpO2: 92%     Body mass index is 28.04 kg/m.     Sclerae unicteric, EOMs intact Wearing a mask No cervical or supraclavicular adenopathy Lungs no rales or rhonchi Heart regular  rate and rhythm Abd soft, nontender, positive bowel sounds MSK no focal spinal tenderness, no upper extremity lymphedema Neuro: nonfocal, well oriented, appropriate affect Breasts: Status post bilateral mastectomies.  Status post bilateral radiation.  There is no evidence of local recurrence.  Both axillae are benign   LAB RESULTS: Lab Results  Component Value Date   WBC 3.8 (L) 11/15/2019   NEUTROABS 2.3 11/15/2019   HGB 10.1 (L) 11/15/2019   HCT 28.7 (L) 11/15/2019   MCV 94.7 11/15/2019   PLT 205 11/15/2019      Chemistry      Component Value Date/Time   NA 143 11/15/2019 1420   NA 140 12/09/2015 1409   K 3.1 (L) 11/15/2019 1420   K 4.2 12/09/2015 1409   CL 109 11/15/2019 1420   CL 107 02/02/2012 0939   CO2 26 11/15/2019 1420   CO2 25 12/09/2015 1409   BUN 12 11/15/2019 1420   BUN 16.7 12/09/2015 1409   CREATININE 1.06 (H) 11/15/2019 1420   CREATININE 1.02 (H) 05/09/2019 1303   CREATININE 0.8 12/09/2015 1409      Component Value Date/Time   CALCIUM 8.1 (L) 11/15/2019 1420   CALCIUM 9.4 12/09/2015 1409   ALKPHOS 54 11/15/2019 1420   ALKPHOS 54 12/09/2015 1409   AST 25 11/15/2019 1420   AST 23 05/09/2019 1303   AST 20 12/09/2015 1409   ALT 15 11/15/2019 1420   ALT 13 05/09/2019 1303   ALT 13 12/09/2015 1409   BILITOT 0.4 11/15/2019 1420   BILITOT 0.4 05/09/2019 1303   BILITOT <0.22 12/09/2015 1409       Lab Results  Component Value Date   LABCA2 56 (H) 12/10/2010    No components found for: [id number]  No results for input(s): INR in the last 168 hours.  Urinalysis    Component Value Date/Time   COLORURINE YELLOW 07/13/2018 0938   APPEARANCEUR [place] (A) 07/13/2018 0938   LABSPEC 1.020 07/13/2018 0938   PHURINE 6.0 07/13/2018 0938   GLUCOSEU NEGATIVE 07/13/2018 0938   HGBUR NEGATIVE 07/13/2018 0938   BILIRUBINUR NEGATIVE 07/13/2018 0938   KETONESUR NEGATIVE 07/13/2018 0938   UROBILINOGEN 0.2 07/13/2018 0938   NITRITE NEGATIVE 07/13/2018 0938    LEUKOCYTESUR TRACE (A) 07/13/2018 0938    STUDIES: CT Chest W Contrast  Result Date: 11/09/2019 CLINICAL DATA:  Follow-up metastatic breast carcinoma. Previous chemotherapy and radiation therapy. Restaging. EXAM: CT CHEST WITH CONTRAST TECHNIQUE: Multidetector CT imaging of the chest was performed during intravenous contrast administration. CONTRAST:  69m OMNIPAQUE IOHEXOL 300 MG/ML  SOLN COMPARISON:  05/09/2019 FINDINGS: Cardiovascular:  No acute findings. Mediastinum/Nodes: Ill-defined mass in the high left axilla encasing the axillary vessels measures 5.5 x 3.6 cm on image 20/2, compared to 5.1 x 3.1 cm previously. Mild mediastinal lymphadenopathy in the AP window measures 1.3 cm, without significant change. No new sites of lymphadenopathy identified. Lungs/Pleura: Mild emphysema again seen. Post radiation  changes are again demonstrated in the right upper lobe. A few sub-cm pulmonary nodules in the right lung remain stable. No new or enlarging pulmonary nodules or masses identified. Upper Abdomen: Moderate hiatal hernia again noted. Hepatic steatosis and several hepatic cysts remain stable. Musculoskeletal:  No suspicious bone lesions. IMPRESSION: Slight increase in size of left axillary mass/lymphadenopathy. No other sites of progressive disease identified. Stable mild mediastinal lymphadenopathy. Stable sub-cm right lung nodules. Stable moderate hiatal hernia and hepatic steatosis. Emphysema (ICD10-J43.9). Electronically Signed   By: Marlaine Hind M.D.   On: 11/09/2019 20:36      ASSESSMENT: 79 y.o. [place] woman:  LEFT BREAST #1  S/P LEFT breast lumpectomy with re-excision on 11/29/95 for a T2 N1bi stage IIb invasive ductal carcinoma , grade 2, estrogen receptor 98% positive, progesterone receptor 97% positive, Ki67 8%.  #2 status post 4 cycles of doxorubicin and cyclophosphamide,    (i) followed by radiation therapy under the care of Dr. [name].  #3 received tamoxifen for a total of  seven years   RIGHT BREAST #4  S/P biopsy of the RIGHT breast upper inner quadrant on 11/14/10 showing invasive ductal carcinoma,, grade 2, estrogen receptor 85% and progesterone receptor 57% positive, Ki67 20%, HER2 not amplified.    #5 started neoadjuvant letrozole in November 2012; switched to tamoxifen as of February 2016 due to osteopenia concerns  #6  S/P right lumpectomy with sentinel lymph node biopsy on 09/03/11 for a ypT2, ypN1a, stage IIB invasive lobular carcinoma, grade 2,estrogen receptor 97% positive, progesterone receptor 12% positive, with no HER-2 amplification.  #7 status post right breast radiation therapy under the care of Dr. [name] from 10/19/2011 to 12/03/2011.   #8 did not meet criteria for genetic testing according to her insurance company.  #9 osteopenia, with a T score of -1.8 on DEXA scan at [facility] 03/07/2013  (a) status post multiple dental extractions and implants  (b) repeat bone density at [facility] 04/02/2015 shows a T score of -2.0  METASTATIC DISEASE: April 2021 #10:  left upper extremity lymphedema led to chest CT scan 05/09/2019 showing a 5.1 cm left subpectoral chest wall mass and thoracic lymphadenopathy   (a) CT biopsy of the chest wall mass 05/25/2019 confirms recurrent breast cancer, strongly estrogen and progesterone receptor positive, HER-2 not amplified  (b) PET scan 05/30/2019 shows a left subpectoral mass measuring 5.4 cm, with significant regional and mediastinal adenopathy, sclerotic left scapular metastasis, but no liver or lung involvement.  (c) CA 27-29 is informative  #11 anastrozole started 06/02/2019; palbociclib added 06/08/2019  (a) palbociclib taken irregularly for several months secondary to cytopenias  (b), to start palbociclib 100 mg daily 21 on 7 off October 2021  (c) CT of the chest with contrast 11/09/2019 shows mild disease progression (despite a continuing drop in the CA 27-29).  #12 denosumab/Xgeva starting 06/08/2019, to be  repeated every 3 months due to hypocalcemia  #13 genetics testing 06/15/2019 through the [facility] Common Hereditary Cancers Panel found no deleterious mutations in APC, ATM, AXIN2, BARD1, BMPR1A, BRCA1, BRCA2, BRIP1, CDH1, CDKN2A (p14ARF), CDKN2A (p16INK4a), CKD4, CHEK2, CTNNA1, DICER1, EPCAM (Deletion/duplication testing only), GREM1 (promoter region deletion/duplication testing only), KIT, MEN1, MLH1, MSH2, MSH3, MSH6, MUTYH, NBN, NF1, NHTL1, PALB2, PDGFRA, PMS2, POLD1, POLE, PTEN, RAD50, RAD51C, RAD51D, RNF43, SDHB, SDHC, SDHD, SMAD4, SMARCA4. STK11, TP53, TSC1, TSC2, and VHL.  The following genes were evaluated for sequence changes only: SDHA and HOXB13 c.251G>A variant only.    PLAN:   Daneka has managed to take  some palbociclib for the last 6 months.  This has been very irregular because of a variety of side effects and cytopenias.  Right now she is tolerating 125 mg Monday Wednesday and Friday and she does this for 3 weeks with a week off.  I think she will be able to tolerate 100 mg daily 21 on 7 off and I have rewritten the prescription to facilitate that.  Despite the anastrozole, palbociclib and denosumab we do see evidence of disease progression on the CT scan of the chest.  This is only a few millimeters but we had expected some shrinkage.  I think the simplest thing to do at this point is to add Faslodex.  She will receive her first dose 11/23/2019 and then every 2 weeks x 2 after which it will be received on every 28-day basis.  We will restage her after 4 to 6 months.  This combination is not effective we will likely go to capecitabine  I am going to see her again in November just to make sure everything is in place and she is tolerating treatment well.  She knows to call for any other issue that may develop before the next visit  Total encounter time 35 minutes.[name] C. Willean Schurman, MD 11/15/19 5:18 PM Medical Oncology and Hematology [facility], [place] [address] Tel. [phone]    Fax. [phone]   I, Wilburn Mylar, am acting as scribe for Dr. Virgie Dad. Chyann Ambrocio.  I, Lurline Del MD, have reviewed the above documentation for accuracy and completeness, and I agree with the above.   *Total Encounter Time as defined by the Centers for Medicare and Medicaid Services includes, in addition to the face-to-face time of a patient visit (documented in the note above) non-face-to-face time: obtaining and reviewing outside history, ordering and reviewing medications, tests or procedures, care coordination (communications with other health care professionals or caregivers) and documentation in the medical record.

## 2019-11-16 LAB — CANCER ANTIGEN 27.29: CA 27.29: 44.2 U/mL — ABNORMAL HIGH (ref 0.0–38.6)

## 2019-11-23 ENCOUNTER — Other Ambulatory Visit: Payer: Self-pay

## 2019-11-23 ENCOUNTER — Inpatient Hospital Stay: Payer: Medicare Other

## 2019-11-23 VITALS — BP 161/69 | HR 91 | Resp 20

## 2019-11-23 DIAGNOSIS — Z170 Estrogen receptor positive status [ER+]: Secondary | ICD-10-CM | POA: Diagnosis not present

## 2019-11-23 DIAGNOSIS — C7951 Secondary malignant neoplasm of bone: Secondary | ICD-10-CM

## 2019-11-23 DIAGNOSIS — C50211 Malignant neoplasm of upper-inner quadrant of right female breast: Secondary | ICD-10-CM | POA: Diagnosis not present

## 2019-11-23 DIAGNOSIS — C773 Secondary and unspecified malignant neoplasm of axilla and upper limb lymph nodes: Secondary | ICD-10-CM | POA: Diagnosis not present

## 2019-11-23 DIAGNOSIS — C50212 Malignant neoplasm of upper-inner quadrant of left female breast: Secondary | ICD-10-CM

## 2019-11-23 DIAGNOSIS — Z5111 Encounter for antineoplastic chemotherapy: Secondary | ICD-10-CM | POA: Diagnosis not present

## 2019-11-23 DIAGNOSIS — R232 Flushing: Secondary | ICD-10-CM | POA: Diagnosis not present

## 2019-11-23 MED ORDER — FULVESTRANT 250 MG/5ML IM SOLN
500.0000 mg | Freq: Once | INTRAMUSCULAR | Status: AC
  Administered 2019-11-23: 500 mg via INTRAMUSCULAR

## 2019-11-23 MED ORDER — FULVESTRANT 250 MG/5ML IM SOLN
INTRAMUSCULAR | Status: AC
  Filled 2019-11-23: qty 10

## 2019-11-23 NOTE — Patient Instructions (Signed)
Fulvestrant injection What is this medicine? FULVESTRANT (ful VES trant) blocks the effects of estrogen. It is used to treat breast cancer. This medicine may be used for other purposes; ask your health care provider or pharmacist if you have questions. COMMON BRAND NAME(S): FASLODEX What should I tell my health care provider before I take this medicine? They need to know if you have any of these conditions:  bleeding disorders  liver disease  low blood counts, like low white cell, platelet, or red cell counts  an unusual or allergic reaction to fulvestrant, other medicines, foods, dyes, or preservatives  pregnant or trying to get pregnant  breast-feeding How should I use this medicine? This medicine is for injection into a muscle. It is usually given by a health care professional in a hospital or clinic setting. Talk to your pediatrician regarding the use of this medicine in children. Special care may be needed. Overdosage: If you think you have taken too much of this medicine contact a poison control center or emergency room at once. NOTE: This medicine is only for you. Do not share this medicine with others. What if I miss a dose? It is important not to miss your dose. Call your doctor or health care professional if you are unable to keep an appointment. What may interact with this medicine?  medicines that treat or prevent blood clots like warfarin, enoxaparin, dalteparin, apixaban, dabigatran, and rivaroxaban This list may not describe all possible interactions. Give your health care provider a list of all the medicines, herbs, non-prescription drugs, or dietary supplements you use. Also tell them if you smoke, drink alcohol, or use illegal drugs. Some items may interact with your medicine. What should I watch for while using this medicine? Your condition will be monitored carefully while you are receiving this medicine. You will need important blood work done while you are taking  this medicine. Do not become pregnant while taking this medicine or for at least 1 year after stopping it. Women of child-bearing potential will need to have a negative pregnancy test before starting this medicine. Women should inform their doctor if they wish to become pregnant or think they might be pregnant. There is a potential for serious side effects to an unborn child. Men should inform their doctors if they wish to father a child. This medicine may lower sperm counts. Talk to your health care professional or pharmacist for more information. Do not breast-feed an infant while taking this medicine or for 1 year after the last dose. What side effects may I notice from receiving this medicine? Side effects that you should report to your doctor or health care professional as soon as possible:  allergic reactions like skin rash, itching or hives, swelling of the face, lips, or tongue  feeling faint or lightheaded, falls  pain, tingling, numbness, or weakness in the legs  signs and symptoms of infection like fever or chills; cough; flu-like symptoms; sore throat  vaginal bleeding Side effects that usually do not require medical attention (report to your doctor or health care professional if they continue or are bothersome):  aches, pains  constipation  diarrhea  headache  hot flashes  nausea, vomiting  pain at site where injected  stomach pain This list may not describe all possible side effects. Call your doctor for medical advice about side effects. You may report side effects to FDA at 1-800-FDA-1088. Where should I keep my medicine? This drug is given in a hospital or clinic and will   not be stored at home. NOTE: This sheet is a summary. It may not cover all possible information. If you have questions about this medicine, talk to your doctor, pharmacist, or health care provider.  2020 Elsevier/Gold Standard (2017-04-29 11:34:41)  

## 2019-11-27 ENCOUNTER — Other Ambulatory Visit: Payer: Self-pay

## 2019-11-27 ENCOUNTER — Encounter: Payer: Self-pay | Admitting: Internal Medicine

## 2019-11-27 ENCOUNTER — Ambulatory Visit (INDEPENDENT_AMBULATORY_CARE_PROVIDER_SITE_OTHER): Payer: Medicare Other | Admitting: Internal Medicine

## 2019-11-27 DIAGNOSIS — J449 Chronic obstructive pulmonary disease, unspecified: Secondary | ICD-10-CM | POA: Diagnosis not present

## 2019-11-27 MED ORDER — ANORO ELLIPTA 62.5-25 MCG/INH IN AEPB: INHALATION_SPRAY | RESPIRATORY_TRACT | 11 refills | Status: DC

## 2019-11-27 MED ORDER — ANORO ELLIPTA 62.5-25 MCG/INH IN AEPB: 1.0000 | INHALATION_SPRAY | Freq: Every day | RESPIRATORY_TRACT | 0 refills | Status: DC

## 2019-11-27 NOTE — Assessment & Plan Note (Signed)
Quit smoking 01/2016 - PFT's  08/27/2016  FEV1 1.83 (86 % ) ratio 63  p 2 % improvement from saba p nothing prior to study with DLCO  43/45c % corrects to 60  % for alv volume  - 08/27/2016   Walked RA  2 laps @ 185 ft each stopped due to legs gave out walking fast pace, no sob or desat  - 08/27/2016  After extensive coaching device  effectiveness =    75% > try spiriva respimat 2.5 mg x 2 pffs each am - 12/02/2018  C/o dry mouth so rec taper off spiriva to see if affects doe or mouth and if can tolerate one puff spiriva  (but not zero due to doe)  then options are one puff spiriva or one stiolto daily - 11/27/2019  After extensive coaching inhaler device,  effectiveness =    90% with elipta so try anoro one click each am due to mmrc2 doe and cost of spiriva    Pt is Group B in terms of symptom/risk and laba/lama therefore appropriate rx at this point >>>  anoro trial appropriate   Advised:  formulary restrictions will be an ongoing challenge for the forseable future and I would be happy to pick an alternative if the pt will first  provide me a list of them -  pt  will need to return here for training for any new device that is required eg dpi vs hfa vs respimat.    In the meantime we can always provide samples so that the patient never runs out of any needed respiratory medications.          Each maintenance medication was reviewed in detail including emphasizing most importantly the difference between maintenance and prns and under what circumstances the prns are to be triggered using an action plan format where appropriate.  Total time for H and P, chart review, counseling, teaching device and generating customized AVS unique to this office visit / charting = 22 min

## 2019-11-27 NOTE — Patient Instructions (Addendum)
Try anoro one click each(turn the number opposite where you can't see the number) - take two good drags from one click - rinse and gargle after   Please schedule a follow up visit in12 months but call sooner if needed

## 2019-11-27 NOTE — Progress Notes (Signed)
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 Pulmonary Consult    Referred by Dr. Cathlean [name] for eval of abnormal PFT. Pt c/o SOB for the past year, progressively getting worse. She states she tires very easily and her exercise tolerance has gone downhill.  She has rx for albuterol inhaler, but never had it filled.   indolent onset doe x 2012 worse since summer 2017 but since then about the same  Doe MMRC1 = can walk nl pace, flat grade, can't hurry or go uphills or steps s sob   Was walking 18 min / mile prior to 23 min mile now  Taking prilosec at least one daily right before bfast for overt HB Was prescribed inhalers but never filled rx or tried one of any kind rec Spiriva 2 puffs each am x 2 week sample and if your ex tolerance is better, stay on it indefinitely  Work on inhaler technique:   You have mild copd (GOLD I)    12/01/2017  f/u ov/Stacey [name] re:  Copd gold I  maint on spiriva only  Chief Complaint  Patient presents with  . Follow-up    c/o sob with exertion  Dyspnea:  [place] park and sob on [place] since "I don't remember" - total walk s stop x 62m  Min at avg pace One mile in 26 min Cough: no Sleeping: flat  rec  No change rx     12/02/2018  f/u ov/Stacey [name] re: GOLD I/ dry mouth on spiriva smi x 2 each am  Chief Complaint  Patient presents with  . Follow-up    Breathing seems slightly worse since her last visit. She does not have a rescue inhaler.   Dyspnea:  MMRC2 =  can't walk a nl pace on a flat grade s sob but does fine slow and flat   Just started doing treadmill:  Walking 30 min x 2 mph at flat grade/ never checks sats  Cough: none Sleeping: flat / sleeps prone, one pillow  SABA use: none 02: none  rec After 2 weeks of exercise try reduce reduce your spiriva to one puff each am and after 2 more weeks stop it completely. If breathing is worse off spiriva we could give you stiolto as a substitute. Please schedule a follow up visit in 12  months but call sooner if needed     11/27/2019  f/u ov/Stacey [name] re: GOLD I   On spiriva rx for met breast ca > fatigue more than limiting sob Chief Complaint  Patient presents with  . Follow-up    Denies any problems with breathing  Dyspnea:  Still MMRC2 = can't walk a nl pace on a flat grade s sob but does fine slow and flat  Cough: none  Sleeping: flat bed one pillow  SABA use: none  02: none    No obvious day to  day or daytime variability or assoc excess/ purulent sputum or mucus plugs or hemoptysis or cp or chest tightness, subjective wheeze or overt sinus or hb symptoms.   Sleeping  without nocturnal  or early am exacerbation  of respiratory  c/o's or need for noct saba. Also denies any obvious fluctuation of symptoms with weather or environmental changes or other aggravating or alleviating factors except as outlined above   No unusual exposure hx or h/o childhood pna/ asthma or knowledge of premature birth.  Current Allergies, Complete Past Medical History, Past Surgical History, Family History, and Social History were reviewed in [employer] record.  ROS  The following are not active complaints unless bolded Hoarseness, sore throat, dysphagia, dental problems, itching, sneezing,  nasal congestion or discharge of excess mucus or purulent secretions, ear ache,   fever, chills, sweats, unintended wt loss or wt gain, classically pleuritic or exertional cp,  orthopnea pnd or arm/hand  swelling on L   or leg swelling, presyncope, palpitations, abdominal pain, anorexia, nausea, vomiting, diarrhea  or change in bowel habits or change in bladder habits, change in stools or change in urine, dysuria, hematuria,  rash, arthralgias, visual complaints, headache, numbness, weakness or ataxia or problems with walking or coordination,  change in mood or  memory.        Current Meds  Medication Sig  . anastrozole (ARIMIDEX) 1 MG tablet Take 1 tablet (1 mg total) by mouth daily.  Marland Kitchen aspirin 81 MG EC tablet Take 81 mg by mouth daily.    . calcium-vitamin D (OSCAL WITH D) 500-200 MG-UNIT per tablet Take 1 tablet by mouth daily.  . cholecalciferol (VITAMIN D) 1000 UNITS tablet Take 1,000 Units by mouth daily.    . cholestyramine (QUESTRAN) 4 g packet Take 1 packet (4 g total) by mouth 3 (three) times daily.  . clotrimazole-betamethasone (LOTRISONE) cream Apply 1 application topically 2 (two) times daily as needed.  . diclofenac sodium (VOLTAREN) 1 % GEL Apply 4 g topically 4 (four) times daily as needed.  . diphenhydrAMINE (BENADRYL) 25 MG tablet Take 25 mg by mouth every 6 (six) hours as needed (for bee stings).   . fluticasone (FLONASE) 50 MCG/ACT nasal spray Place 2 sprays into both nostrils daily.  . fulvestrant (FASLODEX) 250 MG/5ML injection Inject 250 mg into the muscle once. One injection each buttock over 1-2 minutes. Warm prior to use.  . loratadine (CLARITIN) 10 MG tablet Take 10 mg by mouth daily.  . meloxicam (MOBIC) 15 MG tablet TAKE 1 TABLET BY MOUTH DAILY  . Multiple Vitamin (MULTIVITAMIN) capsule Take 1 capsule by mouth daily.    Marland Kitchen omeprazole (PRILOSEC) 20 MG capsule TAKE 1 CAPSULE(20 MG) BY MOUTH DAILY  . palbociclib (IBRANCE) 100 MG tablet Take 1 tablet (100 mg total) by mouth Monday through Friday for 3 weeks, then one week off, then repeat  . polyethylene glycol (MIRALAX / GLYCOLAX) packet take 1 packet once daily if needed for constipation  . prochlorperazine (COMPAZINE)  10 MG tablet Take 1 tablet (10 mg total) by mouth every 6 (six) hours as needed for nausea or vomiting.  . pseudoephedrine (SUDAFED) 30 MG tablet Take 30 mg by mouth every 4 (four) hours as needed for congestion.   Marland Kitchen telmisartan (MICARDIS) 40 MG tablet Take 1 tablet (40 mg total) by mouth daily.  . Tiotropium Bromide Monohydrate (SPIRIVA RESPIMAT) 2.5 MCG/ACT AERS Inhale 2 puffs into the lungs daily.  Marland Kitchen zolpidem (AMBIEN) 10 MG tablet TAKE 1 TABLET(10  MG) BY MOUTH AT BEDTIME AS NEEDED                    Objective:   Physical Exam    amb pleasant wf nad   11/27/2019       163  12/02/2018      170 12/01/2017      171   08/27/16 157 lb (71.2 kg)  05/14/16 163 lb (73.9 kg)  01/14/16 160 lb (72.6 kg)     Vital signs reviewed  11/27/2019  - Note at rest 02 sats  95% on RA      HEENT : pt wearing mask not removed for exam due to covid - 19 concerns.    NECK :  without JVD/Nodes/TM/ nl carotid upstrokes bilaterally   LUNGS: no acc muscle use,  Mild barrel  contour chest wall with bilateral  Distant bs s audible wheeze and  without cough on insp or exp maneuvers  and mild  Hyperresonant  to  percussion bilaterally     CV:  RRR  no s3 or murmur or increase in P2, and no edema   ABD:  soft and nontender with pos end  insp Hoover's  in the supine position. No bruits or organomegaly appreciated, bowel sounds nl  MS:   Nl gait/  ext warm without deformities, calf tenderness, cyanosis or clubbing No obvious joint restrictions   SKIN: warm and dry without lesions    NEURO:  alert, approp, nl sensorium with  no motor or cerebellar deficits apparent.            I personally reviewed images and agree with radiology impression as follows:   Chest CT w contrast 11/09/19 Slight increase in size of left axillary mass/lymphadenopathy. No other sites of progressive disease identified. Stable mild mediastinal lymphadenopathy. Stable sub-cm right lung nodules. Stable moderate  hiatal hernia and hepatic steatosis.    Assessment:

## 2019-12-07 ENCOUNTER — Inpatient Hospital Stay: Payer: Medicare Other | Attending: Adult Health

## 2019-12-07 ENCOUNTER — Other Ambulatory Visit: Payer: Self-pay

## 2019-12-07 ENCOUNTER — Inpatient Hospital Stay: Payer: Medicare Other

## 2019-12-07 VITALS — BP 176/81 | HR 90 | Temp 98.0°F | Resp 18

## 2019-12-07 DIAGNOSIS — C50911 Malignant neoplasm of unspecified site of right female breast: Secondary | ICD-10-CM

## 2019-12-07 DIAGNOSIS — C50212 Malignant neoplasm of upper-inner quadrant of left female breast: Secondary | ICD-10-CM

## 2019-12-07 DIAGNOSIS — C50211 Malignant neoplasm of upper-inner quadrant of right female breast: Secondary | ICD-10-CM

## 2019-12-07 DIAGNOSIS — Z79818 Long term (current) use of other agents affecting estrogen receptors and estrogen levels: Secondary | ICD-10-CM | POA: Diagnosis not present

## 2019-12-07 DIAGNOSIS — C7951 Secondary malignant neoplasm of bone: Secondary | ICD-10-CM | POA: Diagnosis not present

## 2019-12-07 DIAGNOSIS — Z170 Estrogen receptor positive status [ER+]: Secondary | ICD-10-CM | POA: Diagnosis not present

## 2019-12-07 DIAGNOSIS — R9389 Abnormal findings on diagnostic imaging of other specified body structures: Secondary | ICD-10-CM

## 2019-12-07 LAB — CBC WITH DIFFERENTIAL/PLATELET
Abs Immature Granulocytes: 0.02 10*3/uL (ref 0.00–0.07)
Basophils Absolute: 0 10*3/uL (ref 0.0–0.1)
Basophils Relative: 1 %
Eosinophils Absolute: 0 10*3/uL (ref 0.0–0.5)
Eosinophils Relative: 1 %
HCT: 30.3 % — ABNORMAL LOW (ref 36.0–46.0)
Hemoglobin: 10.7 g/dL — ABNORMAL LOW (ref 12.0–15.0)
Immature Granulocytes: 1 %
Lymphocytes Relative: 23 %
Lymphs Abs: 0.7 10*3/uL (ref 0.7–4.0)
MCH: 34.4 pg — ABNORMAL HIGH (ref 26.0–34.0)
MCHC: 35.3 g/dL (ref 30.0–36.0)
MCV: 97.4 fL (ref 80.0–100.0)
Monocytes Absolute: 0.4 10*3/uL (ref 0.1–1.0)
Monocytes Relative: 11 %
Neutro Abs: 2 10*3/uL (ref 1.7–7.7)
Neutrophils Relative %: 63 %
Platelets: 140 10*3/uL — ABNORMAL LOW (ref 150–400)
RBC: 3.11 MIL/uL — ABNORMAL LOW (ref 3.87–5.11)
RDW: 14.8 % (ref 11.5–15.5)
WBC: 3.1 10*3/uL — ABNORMAL LOW (ref 4.0–10.5)
nRBC: 0 % (ref 0.0–0.2)

## 2019-12-07 LAB — COMPREHENSIVE METABOLIC PANEL
ALT: 12 U/L (ref 0–44)
AST: 22 U/L (ref 15–41)
Albumin: 3.6 g/dL (ref 3.5–5.0)
Alkaline Phosphatase: 62 U/L (ref 38–126)
Anion gap: 9 (ref 5–15)
BUN: 16 mg/dL (ref 8–23)
CO2: 26 mmol/L (ref 22–32)
Calcium: 8.6 mg/dL — ABNORMAL LOW (ref 8.9–10.3)
Chloride: 108 mmol/L (ref 98–111)
Creatinine, Ser: 0.95 mg/dL (ref 0.44–1.00)
GFR, Estimated: >60 mL/min (ref >60)
Glucose, Bld: 108 mg/dL — ABNORMAL HIGH (ref 70–99)
Potassium: 3.5 mmol/L (ref 3.5–5.1)
Sodium: 143 mmol/L (ref 135–145)
Total Bilirubin: 0.5 mg/dL (ref 0.3–1.2)
Total Protein: 7.1 g/dL (ref 6.5–8.1)

## 2019-12-07 MED ORDER — FULVESTRANT 250 MG/5ML IM SOLN
INTRAMUSCULAR | Status: AC
  Filled 2019-12-07: qty 5

## 2019-12-07 MED ORDER — FULVESTRANT 250 MG/5ML IM SOLN
500.0000 mg | Freq: Once | INTRAMUSCULAR | Status: AC
  Administered 2019-12-07: 500 mg via INTRAMUSCULAR

## 2019-12-07 NOTE — Progress Notes (Signed)
Dr Jana Hakim notified of pt BP of 176/81 OK to give faslodex injections

## 2019-12-21 ENCOUNTER — Inpatient Hospital Stay: Payer: Medicare Other

## 2019-12-21 ENCOUNTER — Telehealth: Payer: Self-pay | Admitting: Oncology

## 2019-12-21 ENCOUNTER — Other Ambulatory Visit: Payer: Self-pay

## 2019-12-21 ENCOUNTER — Inpatient Hospital Stay (HOSPITAL_BASED_OUTPATIENT_CLINIC_OR_DEPARTMENT_OTHER): Payer: Medicare Other | Admitting: Oncology

## 2019-12-21 VITALS — BP 173/74 | HR 86 | Temp 97.1°F | Resp 18 | Ht 63.0 in | Wt 161.6 lb

## 2019-12-21 DIAGNOSIS — C50912 Malignant neoplasm of unspecified site of left female breast: Secondary | ICD-10-CM

## 2019-12-21 DIAGNOSIS — Z170 Estrogen receptor positive status [ER+]: Secondary | ICD-10-CM

## 2019-12-21 DIAGNOSIS — R9389 Abnormal findings on diagnostic imaging of other specified body structures: Secondary | ICD-10-CM

## 2019-12-21 DIAGNOSIS — C50212 Malignant neoplasm of upper-inner quadrant of left female breast: Secondary | ICD-10-CM

## 2019-12-21 DIAGNOSIS — C7951 Secondary malignant neoplasm of bone: Secondary | ICD-10-CM

## 2019-12-21 DIAGNOSIS — C50911 Malignant neoplasm of unspecified site of right female breast: Secondary | ICD-10-CM

## 2019-12-21 DIAGNOSIS — C50211 Malignant neoplasm of upper-inner quadrant of right female breast: Secondary | ICD-10-CM | POA: Diagnosis not present

## 2019-12-21 DIAGNOSIS — Z79818 Long term (current) use of other agents affecting estrogen receptors and estrogen levels: Secondary | ICD-10-CM | POA: Diagnosis not present

## 2019-12-21 LAB — COMPREHENSIVE METABOLIC PANEL
ALT: 12 U/L (ref 0–44)
AST: 21 U/L (ref 15–41)
Albumin: 3.7 g/dL (ref 3.5–5.0)
Alkaline Phosphatase: 63 U/L (ref 38–126)
Anion gap: 8 (ref 5–15)
BUN: 16 mg/dL (ref 8–23)
CO2: 27 mmol/L (ref 22–32)
Calcium: 9.1 mg/dL (ref 8.9–10.3)
Chloride: 108 mmol/L (ref 98–111)
Creatinine, Ser: 1.09 mg/dL — ABNORMAL HIGH (ref 0.44–1.00)
GFR, Estimated: 52 mL/min — ABNORMAL LOW (ref >60)
Glucose, Bld: 105 mg/dL — ABNORMAL HIGH (ref 70–99)
Potassium: 3.8 mmol/L (ref 3.5–5.1)
Sodium: 143 mmol/L (ref 135–145)
Total Bilirubin: 0.4 mg/dL (ref 0.3–1.2)
Total Protein: 7.2 g/dL (ref 6.5–8.1)

## 2019-12-21 LAB — CBC WITH DIFFERENTIAL/PLATELET
Abs Immature Granulocytes: 0.02 10*3/uL (ref 0.00–0.07)
Basophils Absolute: 0 10*3/uL (ref 0.0–0.1)
Basophils Relative: 1 %
Eosinophils Absolute: 0.1 10*3/uL (ref 0.0–0.5)
Eosinophils Relative: 2 %
HCT: 31.2 % — ABNORMAL LOW (ref 36.0–46.0)
Hemoglobin: 10.7 g/dL — ABNORMAL LOW (ref 12.0–15.0)
Immature Granulocytes: 1 %
Lymphocytes Relative: 21 %
Lymphs Abs: 0.8 10*3/uL (ref 0.7–4.0)
MCH: 34.2 pg — ABNORMAL HIGH (ref 26.0–34.0)
MCHC: 34.3 g/dL (ref 30.0–36.0)
MCV: 99.7 fL (ref 80.0–100.0)
Monocytes Absolute: 0.3 10*3/uL (ref 0.1–1.0)
Monocytes Relative: 9 %
Neutro Abs: 2.4 10*3/uL (ref 1.7–7.7)
Neutrophils Relative %: 66 %
Platelets: 292 10*3/uL (ref 150–400)
RBC: 3.13 MIL/uL — ABNORMAL LOW (ref 3.87–5.11)
RDW: 14.1 % (ref 11.5–15.5)
WBC: 3.6 10*3/uL — ABNORMAL LOW (ref 4.0–10.5)
nRBC: 0 % (ref 0.0–0.2)

## 2019-12-21 MED ORDER — FULVESTRANT 250 MG/5ML IM SOLN
INTRAMUSCULAR | Status: AC
  Filled 2019-12-21: qty 10

## 2019-12-21 MED ORDER — DENOSUMAB 120 MG/1.7ML ~~LOC~~ SOLN
120.0000 mg | Freq: Once | SUBCUTANEOUS | Status: AC
  Administered 2019-12-21: 120 mg via SUBCUTANEOUS

## 2019-12-21 MED ORDER — TRIAMTERENE-HCTZ 37.5-25 MG PO CAPS: 1.0000 | ORAL_CAPSULE | Freq: Every morning | ORAL | 4 refills | Status: DC

## 2019-12-21 MED ORDER — DENOSUMAB 120 MG/1.7ML ~~LOC~~ SOLN
SUBCUTANEOUS | Status: AC
  Filled 2019-12-21: qty 1.7

## 2019-12-21 MED ORDER — FULVESTRANT 250 MG/5ML IM SOLN
500.0000 mg | Freq: Once | INTRAMUSCULAR | Status: AC
  Administered 2019-12-21: 500 mg via INTRAMUSCULAR

## 2019-12-21 NOTE — Telephone Encounter (Signed)
Scheduled appts per 11/18 los. Gave pt a print out of AVS.  

## 2019-12-21 NOTE — Progress Notes (Signed)
ID: Stacey [name]   DOB: Dec 22, 1940  MR#: [id number]  EOF#:[id number]  Patient Care Team: Biagio Borg, MD as PCP - General (Internal Medicine) Tyler Pita, MD as Consulting Physician (Radiation Oncology) Roseanne Kaufman, MD as Consulting Physician (Orthopedic Surgery) Lyndal Pulley, DO as Consulting Physician (Family Medicine) Febe Champa, Virgie Dad, MD as Consulting Physician (Oncology) OTHER MD:   CHIEF COMPLAINT: metastatic breast cancer  CURRENT TREATMENT: anastrozole; palbociclib/Ibrance; denosumab/Xgeva every 12 weeks; fulvestrant   INTERVAL HISTORY:   Stacey [name] returns today for follow up of her now metastatic breast cancer.  She started her anastrozole 06/02/2019.  Does not have significant problems from this medication and obtains it at a very good price  She continues on palbociclib, currently at 125 mg Monday Wednesday and Friday which she does for 3 weeks and then has a week off.  She is having no side effects from this that she is aware of.  Given the results of her recent CT scan we added fulvestrant at her last visit, which she began on 11/23/2019.  She had no side effects from this that she is aware of  She is also on denosumab/Xgeva, with her most recent dose on 09/28/2019.  This is repeated every 12 weeks with a dose due today  We are following her CA 27-29: Lab Results  Component Value Date   CA2729 44.2 (H) 11/15/2019   CA2729 48.5 (H) 10/18/2019   CA2729 51.5 (H) 09/11/2019   CA2729 66.5 (H) 08/02/2019   CA2729 79.7 (H) 07/06/2019    REVIEW OF SYSTEMS: Florita is very stable in her activities.  She continues to have left upper extremity lymphedema which she uses compression sleeves for and she also has a special way to increase the compression with Velcro that she is using.  She is planning a simple Thanksgiving meal with family.  Everybody has been vaccinated.  Her main concern is her blood pressure which has been in the 175/85 range despite having been started  on telmisartan by her primary care physician in January   COVID 19 VACCINATION STATUS: Fully vaccinated with booster in September   HISTORY OF PRESENT ILLNESS:  From the original intake note:  " Stacey [name] is a 79 year old [place] woman, who has a history of breast cancer starting in 1997 and in 2013.    On 11/22/95, she underwent a left breast biopsy, which revealed malignant cells.  She had a left breast lumpectomy with re-excision on 11/29/95, with final pathology revealed IDC, grade 2, 2.5 cm, ER 98%, PR 97%, Ki67 8% with one of 15 nodes being positive.  She received chemotherapy, 4 cycles per patient, along with radiation and took Tamoxifen for 7 years following radiation completion.  Records from 1997 limited.    In October 2012, she had a biopsy of the right breast after mammography recommended additional images for a dense area in the right breast.  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  On 11/25/10, she had an MRI that measured the area in the right breast as 2.2 cm.  She started neoadjuvant [name] in November 2012 and has continued it since.  The area of concern measured 1.7 cm on a repeat MRI in March of 2013.  On 09/03/11, she underwent a right lumpectomy with sentinel lymph node biopsy with final pathology resulting invasive lobular carcinoma with calcifications, grade 2, 2.5 cm  wit lobular carcinoma in situ.  Surgical margins were negative with one of one sentinel lymph node positive for metastatic carcinoma.  She then underwent radiation therapy under the care of Dr. [name] from 10/19/11 to 12/03/11. "   Her subsequent history is as detailed below   PAST MEDICAL HISTORY: Past Medical History:  Diagnosis Date  . ANEMIA-NOS   . ANXIETY   . Breast cancer ([facility]) 1997 L, 2012 R   s/p chemo/xrt  . COPD    resolved  . DIVERTICULOSIS, COLON 2008  .  Dizziness   . Family history of breast cancer   . Family history of lung cancer   . Family history of lymphoma   . Family history of pancreatic cancer   . Family history of uterine cancer   . GERD   . Hx of radiation therapy 10/19/11 -12/03/11   right breast  . HYPERLIPIDEMIA   . IRRITABLE BOWEL SYNDROME, HX OF   . Left-sided carotid artery disease (H[facility])    moderate left ICA stenosis  . OSTEOARTHRITIS, HAND   . PSVT (paroxysmal supraventricular tachycardia) ([facility])    symptomatic on event monitor    PAST SURGICAL HISTORY: Past Surgical History:  Procedure Laterality Date  . ABDOMINAL HYSTERECTOMY    . APPENDECTOMY    . BREAST BIOPSY  11/14/10    r breast: inv, insitu mammary carcinoma w/calcif, er/pr +, her2 -  . BREAST SURGERY     lumpectomy  . CATARACT EXTRACTION     both eyes  . ELECTROPHYSIOLOGIC STUDY N/A 05/10/2015   Procedure: SVT Ablation;  Surgeon: Will Meredith Leeds, MD;  Location: [facility];  Service: Cardiovascular;  Laterality: N/A;  . HERNIA REPAIR    . inguinal herniorrhapy  1984   left  . IR US GUIDE BX ASP/DRAIN  05/26/2019  . rectal fissure repair    . s/p benign breast biopsy  2003   right  . s/p left foot surgury  2009  . 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 spiral fx left foot  2008   no surgury  . TMJ ARTHROPLASTY  1989  . [facility]- to remove scar tissue growth   . TONSILLECTOMY      FAMILY HISTORY Family History  Problem Relation Age of Onset  . Hypertension Mother   . Stroke Mother   . Colon polyps Mother   . Diabetes Mother   . Pancreatic cancer Mother 54  . Uterine cancer Mother 52  . Lymphoma Brother        burkitts  . Lung cancer Paternal Uncle   . Lung cancer Maternal Grandmother 55       non-smoker  . Lung cancer Maternal Grandfather   . Breast cancer Cousin        maternal cousin, dx in her mid 41s  . Brain cancer Cousin        maternal cousin's son; dx in his 42s  . Testicular cancer Cousin         maternal cousin's son;   . Breast cancer Cousin        paternal cousin; dx in her 66s  . Breast cancer Cousin        paternal cousin's daughter; dx in 50s; neg genetic testing  . Colon cancer Neg Hx   . Esophageal cancer Neg Hx   . Stomach cancer Neg Hx   . Rectal cancer Neg Hx     GYNECOLOGIC  HISTORY:   Menarche age 15, GX P0 (one miscarriage at 5 months). Status post vaginal hysterectomy in the late 1970s, no salpingo-oophorectomy    SOCIAL HISTORY:   The patient lives alone and is divorced. She retired from [employer], were she worked in Physiological scientist.Marland Kitchen     ADVANCED DIRECTIVES:  Living will in place; the patient's healthcare power of attorney is her stepdaughter, Vladimir Faster, who can be reached at [place]: Social History   Tobacco Use  . Smoking status: Former Smoker    Packs/day: 1.00    Years: 54.00    Pack years: 54.00    Types: Cigarettes    Quit date: 01/03/2016    Years since quitting: 3.9  . Smokeless tobacco: Never Used  Vaping Use  . Vaping Use: Never used  Substance Use Topics  . Alcohol use: Yes    Comment: rare/ drinks socially  . Drug use: No    Allergies  Allergen Reactions  . Bee Venom Swelling  . Clindamycin/Lincomycin Diarrhea and Nausea And Vomiting  . Codeine Nausea And Vomiting    Current Outpatient Medications  Medication Sig Dispense Refill  . anastrozole (ARIMIDEX) 1 MG tablet Take 1 tablet (1 mg total) by mouth daily. 90 tablet 4  . aspirin 81 MG EC tablet Take 81 mg by mouth daily.      . calcium-vitamin D (OSCAL WITH D) 500-200 MG-UNIT per tablet Take 1 tablet by mouth daily.    . cholecalciferol (VITAMIN D) 1000 UNITS tablet Take 1,000 Units by mouth daily.      . cholestyramine (QUESTRAN) 4 g packet Take 1 packet (4 g total) by mouth 3 (three) times daily. 60 each 12  . clotrimazole-betamethasone (LOTRISONE) cream Apply 1 application topically 2 (two) times daily as needed. 30 g 1  . diclofenac  sodium (VOLTAREN) 1 % GEL Apply 4 g topically 4 (four) times daily as needed. 400 g 11  . diphenhydrAMINE (BENADRYL) 25 MG tablet Take 25 mg by mouth every 6 (six) hours as needed (for bee stings).     . fluticasone (FLONASE) 50 MCG/ACT nasal spray Place 2 sprays into both nostrils daily.    . fulvestrant (FASLODEX) 250 MG/5ML injection Inject 250 mg into the muscle once. One injection each buttock over 1-2 minutes. Warm prior to use.    . loratadine (CLARITIN) 10 MG tablet Take 10 mg by mouth daily.    . meloxicam (MOBIC) 15 MG tablet TAKE 1 TABLET BY MOUTH DAILY 90 tablet 1  . Multiple Vitamin (MULTIVITAMIN) capsule Take 1 capsule by mouth daily.      Marland Kitchen omeprazole (PRILOSEC) 20 MG capsule TAKE 1 CAPSULE(20 MG) BY MOUTH DAILY 90 capsule 3  . palbociclib (IBRANCE) 100 MG tablet Take 1 tablet (100 mg total) by mouth Monday through Friday for 3 weeks, then one week off, then repeat 15 tablet 6  . polyethylene glycol (MIRALAX / GLYCOLAX) packet take 1 packet once daily if needed for constipation 30 packet 11  . prochlorperazine (COMPAZINE) 10 MG tablet Take 1 tablet (10 mg total) by mouth every 6 (six) hours as needed for nausea or vomiting. 30 tablet 1  . pseudoephedrine (SUDAFED) 30 MG tablet Take 30 mg by mouth every 4 (four) hours as needed for congestion.     Marland Kitchen telmisartan (MICARDIS) 40 MG tablet Take 1 tablet (40 mg total) by mouth daily. 90 tablet 3  . triamterene-hydrochlorothiazide (DYAZIDE) 37.5-25 MG capsule Take 1 each (1 capsule total) by mouth  in the morning. 90 capsule 4  . umeclidinium-vilanterol (ANORO ELLIPTA) 62.5-25 MCG/INH AEPB Only open the device one time and take deep breath first thing am 1 each 11  . zolpidem (AMBIEN) 10 MG tablet TAKE 1 TABLET(10 MG) BY MOUTH AT BEDTIME AS NEEDED 90 tablet 1   No current facility-administered medications for this visit.    OBJECTIVE: White woman in no acute distress Vitals:   12/21/19 1256  BP: (!) 173/74  Pulse: 86  Resp: 18  Temp:  (!) 97.1 F (36.2 C)  SpO2: 95%     Body mass index is 28.63 kg/m.     Sclerae unicteric, EOMs intact Wearing a mask No cervical or supraclavicular adenopathy Lungs no rales or rhonchi Heart regular rate and rhythm Abd soft, nontender, positive bowel sounds MSK arthrosis but no focal spinal tenderness, no upper extremity lymphedema Neuro: nonfocal, well oriented, appropriate affect Breasts: Deferred     LAB RESULTS: Lab Results  Component Value Date   WBC 3.6 (L) 12/21/2019   NEUTROABS 2.4 12/21/2019   HGB 10.7 (L) 12/21/2019   HCT 31.2 (L) 12/21/2019   MCV 99.7 12/21/2019   PLT 292 12/21/2019      Chemistry      Component Value Date/Time   NA 143 12/21/2019 1237   NA 140 12/09/2015 1409   K 3.8 12/21/2019 1237   K 4.2 12/09/2015 1409   CL 108 12/21/2019 1237   CL 107 02/02/2012 0939   CO2 27 12/21/2019 1237   CO2 25 12/09/2015 1409   BUN 16 12/21/2019 1237   BUN 16.7 12/09/2015 1409   CREATININE 1.09 (H) 12/21/2019 1237   CREATININE 1.02 (H) 05/09/2019 1303   CREATININE 0.8 12/09/2015 1409      Component Value Date/Time   CALCIUM 9.1 12/21/2019 1237   CALCIUM 9.4 12/09/2015 1409   ALKPHOS 63 12/21/2019 1237   ALKPHOS 54 12/09/2015 1409   AST 21 12/21/2019 1237   AST 23 05/09/2019 1303   AST 20 12/09/2015 1409   ALT 12 12/21/2019 1237   ALT 13 05/09/2019 1303   ALT 13 12/09/2015 1409   BILITOT 0.4 12/21/2019 1237   BILITOT 0.4 05/09/2019 1303   BILITOT <0.22 12/09/2015 1409       Lab Results  Component Value Date   LABCA2 56 (H) 12/10/2010    No components found for: [id number]  No results for input(s): INR in the last 168 hours.  Urinalysis    Component Value Date/Time   COLORURINE YELLOW 07/13/2018 0938   APPEARANCEUR [place] (A) 07/13/2018 0938   LABSPEC 1.020 07/13/2018 0938   PHURINE 6.0 07/13/2018 0938   GLUCOSEU NEGATIVE 07/13/2018 0938   HGBUR NEGATIVE 07/13/2018 0938   BILIRUBINUR NEGATIVE 07/13/2018 0938   KETONESUR  NEGATIVE 07/13/2018 0938   UROBILINOGEN 0.2 07/13/2018 0938   NITRITE NEGATIVE 07/13/2018 0938   LEUKOCYTESUR TRACE (A) 07/13/2018 0938    STUDIES: No results found.    ASSESSMENT: 79 y.o. [place] woman:  LEFT BREAST #1  S/P LEFT breast lumpectomy with re-excision on 11/29/95 for a T2 N1bi stage IIb invasive ductal carcinoma , grade 2, estrogen receptor 98% positive, progesterone receptor 97% positive, Ki67 8%.  #2 status post 4 cycles of doxorubicin and cyclophosphamide,    (i) followed by radiation therapy under the care of Dr. [name].  #3 received tamoxifen for a total of seven years   RIGHT BREAST #4  S/P biopsy of the RIGHT breast upper inner quadrant on 11/14/10 showing invasive ductal  carcinoma,, grade 2, estrogen receptor 85% and progesterone receptor 57% positive, Ki67 20%, HER2 not amplified.    #5 started neoadjuvant letrozole in November 2012; switched to tamoxifen as of February 2016 due to osteopenia concerns  #6  S/P right lumpectomy with sentinel lymph node biopsy on 09/03/11 for a ypT2, ypN1a, stage IIB invasive lobular carcinoma, grade 2,estrogen receptor 97% positive, progesterone receptor 12% positive, with no HER-2 amplification.  #7 status post right breast radiation therapy under the care of Dr. [name] from 10/19/2011 to 12/03/2011.   #8 did not meet criteria for genetic testing according to her insurance company.  #9 osteopenia, with a T score of -1.8 on DEXA scan at [facility] 03/07/2013  (a) status post multiple dental extractions and implants  (b) repeat bone density at [facility] 04/02/2015 shows a T score of -2.0  METASTATIC DISEASE: April 2021 #10:  left upper extremity lymphedema led to chest CT scan 05/09/2019 showing a 5.1 cm left subpectoral chest wall mass and thoracic lymphadenopathy   (a) CT biopsy of the chest wall mass 05/25/2019 confirms recurrent breast cancer, strongly estrogen and progesterone receptor positive, HER-2 not amplified  (b) PET  scan 05/30/2019 shows a left subpectoral mass measuring 5.4 cm, with significant regional and mediastinal adenopathy, sclerotic left scapular metastasis, but no liver or lung involvement.  (c) CA 27-29 is informative  #11 anastrozole started 06/02/2019; palbociclib added 06/08/2019  (a) palbociclib taken irregularly for several months secondary to cytopenias  (b), to start palbociclib 100 mg daily 21 on 7 off October 2021  (c) CT of the chest with contrast 11/09/2019 shows mild disease progression (despite a continuing drop in the CA 27-29)  (d) fulvestrant added 11/23/2019  #12 denosumab/Xgeva starting 06/08/2019, to be repeated every 3 months due to hypocalcemia  #13 genetics testing 06/15/2019 through the [facility] Common Hereditary Cancers Panel found no deleterious mutations in APC, ATM, AXIN2, BARD1, BMPR1A, BRCA1, BRCA2, BRIP1, CDH1, CDKN2A (p14ARF), CDKN2A (p16INK4a), CKD4, CHEK2, CTNNA1, DICER1, EPCAM (Deletion/duplication testing only), GREM1 (promoter region deletion/duplication testing only), KIT, MEN1, MLH1, MSH2, MSH3, MSH6, MUTYH, NBN, NF1, NHTL1, PALB2, PDGFRA, PMS2, POLD1, POLE, PTEN, RAD50, RAD51C, RAD51D, RNF43, SDHB, SDHC, SDHD, SMAD4, SMARCA4. STK11, TP53, TSC1, TSC2, and VHL.  The following genes were evaluated for sequence changes only: SDHA and HOXB13 c.251G>A variant only.    PLAN:   Esabella remains very stable, with no symptoms directly related to her cancer, and tolerated her treatments also quite well.  She has not had a very gratifying drop in her tumor marker.  The CT scan most recently did show mild progression and we have added Faslodex.  I would not expect the Faslodex to act immediately but certainly within 3 to 4 months we will have to be repeating a CT of the chest.  Since her blood pressure has not been well controlled I went ahead and added low-dose Dyazide for her to take every morning.  She will keep tabs on her blood pressures.  If this does not bring it to a  acceptable level she will call her primary physician.  Otherwise she will see me again in 8 weeks.  She knows to call for any other issue that may develop before then  Total encounter time 30 minutes.[name] C. Artrell Lawless, MD 12/21/19 1:21 PM Medical Oncology and Hematology [facility] [place], [place] [address] Tel. [phone]    Fax. [phone]   I, Wilburn Mylar, am acting as scribe for Dr. Virgie Dad.  Sophie Tamez.  I, Lurline Del MD, have reviewed the above documentation for accuracy and completeness, and I agree with the above.   *Total Encounter Time as defined by the Centers for Medicare and Medicaid Services includes, in addition to the face-to-face time of a patient visit (documented in the note above) non-face-to-face time: obtaining and reviewing outside history, ordering and reviewing medications, tests or procedures, care coordination (communications with other health care professionals or caregivers) and documentation in the medical record.

## 2019-12-22 LAB — CANCER ANTIGEN 27.29: CA 27.29: 39.9 U/mL — ABNORMAL HIGH (ref 0.0–38.6)

## 2019-12-22 NOTE — Telephone Encounter (Signed)
Ok to resume prior rx with spiriva whatever way she was taking it

## 2019-12-22 NOTE — Telephone Encounter (Signed)
Dr Melvyn Novas- please advise on pt email and advise if you want her back on the spiriva of something else..looks like she had complained before about dry mouth with the spiriva, thanks!!  I have tried the sample of Anoro Ellipt that you gave me on my most recent visit and I do not like this powder form. Please renew my prescription for Spiriva.  Thank you.  Stacey [name] [birth year]  Last AVS:  Instructions  Try anoro one click each(turn the number opposite where you can't see the number) - take two good drags from one click - rinse and gargle after   Please schedule a follow up visit in12 months but call sooner if needed

## 2019-12-25 ENCOUNTER — Telehealth: Payer: Self-pay

## 2019-12-25 MED ORDER — SPIRIVA RESPIMAT 2.5 MCG/ACT IN AERS: 2.0000 | INHALATION_SPRAY | Freq: Every day | RESPIRATORY_TRACT | 11 refills | Status: DC

## 2020-01-09 NOTE — Telephone Encounter (Signed)
Oral Oncology Patient Advocate Encounter  Met patient in [place] to complete re-enrollment application for [facility] in an effort to reduce patient's out of pocket expense for Ibrance to $0.    Application completed and faxed to [phone].   [employer] patient assistance phone number for follow up is [phone].   This encounter will be updated until final determination.   [place] Patient [place] Phone [phone] Fax [phone] 01/09/2020 4:02 PM

## 2020-01-12 ENCOUNTER — Other Ambulatory Visit: Payer: Self-pay | Admitting: Internal Medicine

## 2020-01-18 ENCOUNTER — Inpatient Hospital Stay: Payer: Medicare Other

## 2020-01-18 ENCOUNTER — Other Ambulatory Visit: Payer: Self-pay | Admitting: Oncology

## 2020-01-18 ENCOUNTER — Inpatient Hospital Stay: Payer: Medicare Other | Attending: Adult Health

## 2020-01-18 ENCOUNTER — Ambulatory Visit: Payer: Medicare Other

## 2020-01-18 ENCOUNTER — Other Ambulatory Visit: Payer: Medicare Other

## 2020-01-18 ENCOUNTER — Other Ambulatory Visit: Payer: Self-pay

## 2020-01-18 DIAGNOSIS — C50211 Malignant neoplasm of upper-inner quadrant of right female breast: Secondary | ICD-10-CM

## 2020-01-18 DIAGNOSIS — Z79818 Long term (current) use of other agents affecting estrogen receptors and estrogen levels: Secondary | ICD-10-CM | POA: Insufficient documentation

## 2020-01-18 DIAGNOSIS — C7951 Secondary malignant neoplasm of bone: Secondary | ICD-10-CM

## 2020-01-18 DIAGNOSIS — R9389 Abnormal findings on diagnostic imaging of other specified body structures: Secondary | ICD-10-CM

## 2020-01-18 DIAGNOSIS — Z170 Estrogen receptor positive status [ER+]: Secondary | ICD-10-CM | POA: Diagnosis not present

## 2020-01-18 DIAGNOSIS — C50912 Malignant neoplasm of unspecified site of left female breast: Secondary | ICD-10-CM

## 2020-01-18 DIAGNOSIS — C50911 Malignant neoplasm of unspecified site of right female breast: Secondary | ICD-10-CM

## 2020-01-18 LAB — CBC WITH DIFFERENTIAL/PLATELET
Abs Immature Granulocytes: 0.02 10*3/uL (ref 0.00–0.07)
Basophils Absolute: 0 10*3/uL (ref 0.0–0.1)
Basophils Relative: 1 %
Eosinophils Absolute: 0.1 10*3/uL (ref 0.0–0.5)
Eosinophils Relative: 2 %
HCT: 31.6 % — ABNORMAL LOW (ref 36.0–46.0)
Hemoglobin: 10.9 g/dL — ABNORMAL LOW (ref 12.0–15.0)
Immature Granulocytes: 1 %
Lymphocytes Relative: 24 %
Lymphs Abs: 0.9 10*3/uL (ref 0.7–4.0)
MCH: 34.5 pg — ABNORMAL HIGH (ref 26.0–34.0)
MCHC: 34.5 g/dL (ref 30.0–36.0)
MCV: 100 fL (ref 80.0–100.0)
Monocytes Absolute: 0.3 10*3/uL (ref 0.1–1.0)
Monocytes Relative: 8 %
Neutro Abs: 2.5 10*3/uL (ref 1.7–7.7)
Neutrophils Relative %: 64 %
Platelets: 284 10*3/uL (ref 150–400)
RBC: 3.16 MIL/uL — ABNORMAL LOW (ref 3.87–5.11)
RDW: 14.3 % (ref 11.5–15.5)
WBC: 3.8 10*3/uL — ABNORMAL LOW (ref 4.0–10.5)
nRBC: 0 % (ref 0.0–0.2)

## 2020-01-18 LAB — COMPREHENSIVE METABOLIC PANEL
ALT: 17 U/L (ref 0–44)
AST: 23 U/L (ref 15–41)
Albumin: 3.7 g/dL (ref 3.5–5.0)
Alkaline Phosphatase: 61 U/L (ref 38–126)
Anion gap: 9 (ref 5–15)
BUN: 19 mg/dL (ref 8–23)
CO2: 21 mmol/L — ABNORMAL LOW (ref 22–32)
Calcium: 8.2 mg/dL — ABNORMAL LOW (ref 8.9–10.3)
Chloride: 114 mmol/L — ABNORMAL HIGH (ref 98–111)
Creatinine, Ser: 1.24 mg/dL — ABNORMAL HIGH (ref 0.44–1.00)
GFR, Estimated: 44 mL/min — ABNORMAL LOW (ref >60)
Glucose, Bld: 110 mg/dL — ABNORMAL HIGH (ref 70–99)
Potassium: 4 mmol/L (ref 3.5–5.1)
Sodium: 144 mmol/L (ref 135–145)
Total Bilirubin: 0.3 mg/dL (ref 0.3–1.2)
Total Protein: 7.5 g/dL (ref 6.5–8.1)

## 2020-01-18 MED ORDER — FULVESTRANT 250 MG/5ML IM SOLN
500.0000 mg | Freq: Once | INTRAMUSCULAR | Status: AC
  Administered 2020-01-18: 16:00:00 500 mg via INTRAMUSCULAR

## 2020-01-18 MED ORDER — FULVESTRANT 250 MG/5ML IM SOLN
INTRAMUSCULAR | Status: AC
  Filled 2020-01-18: qty 10

## 2020-01-18 NOTE — Patient Instructions (Signed)
Fulvestrant injection What is this medicine? FULVESTRANT (ful VES trant) blocks the effects of estrogen. It is used to treat breast cancer. This medicine may be used for other purposes; ask your health care provider or pharmacist if you have questions. COMMON BRAND NAME(S): FASLODEX What should I tell my health care provider before I take this medicine? They need to know if you have any of these conditions:  bleeding disorders  liver disease  low blood counts, like low white cell, platelet, or red cell counts  an unusual or allergic reaction to fulvestrant, other medicines, foods, dyes, or preservatives  pregnant or trying to get pregnant  breast-feeding How should I use this medicine? This medicine is for injection into a muscle. It is usually given by a health care professional in a hospital or clinic setting. Talk to your pediatrician regarding the use of this medicine in children. Special care may be needed. Overdosage: If you think you have taken too much of this medicine contact a poison control center or emergency room at once. NOTE: This medicine is only for you. Do not share this medicine with others. What if I miss a dose? It is important not to miss your dose. Call your doctor or health care professional if you are unable to keep an appointment. What may interact with this medicine?  medicines that treat or prevent blood clots like warfarin, enoxaparin, dalteparin, apixaban, dabigatran, and rivaroxaban This list may not describe all possible interactions. Give your health care provider a list of all the medicines, herbs, non-prescription drugs, or dietary supplements you use. Also tell them if you smoke, drink alcohol, or use illegal drugs. Some items may interact with your medicine. What should I watch for while using this medicine? Your condition will be monitored carefully while you are receiving this medicine. You will need important blood work done while you are taking  this medicine. Do not become pregnant while taking this medicine or for at least 1 year after stopping it. Women of child-bearing potential will need to have a negative pregnancy test before starting this medicine. Women should inform their doctor if they wish to become pregnant or think they might be pregnant. There is a potential for serious side effects to an unborn child. Men should inform their doctors if they wish to father a child. This medicine may lower sperm counts. Talk to your health care professional or pharmacist for more information. Do not breast-feed an infant while taking this medicine or for 1 year after the last dose. What side effects may I notice from receiving this medicine? Side effects that you should report to your doctor or health care professional as soon as possible:  allergic reactions like skin rash, itching or hives, swelling of the face, lips, or tongue  feeling faint or lightheaded, falls  pain, tingling, numbness, or weakness in the legs  signs and symptoms of infection like fever or chills; cough; flu-like symptoms; sore throat  vaginal bleeding Side effects that usually do not require medical attention (report to your doctor or health care professional if they continue or are bothersome):  aches, pains  constipation  diarrhea  headache  hot flashes  nausea, vomiting  pain at site where injected  stomach pain This list may not describe all possible side effects. Call your doctor for medical advice about side effects. You may report side effects to FDA at 1-800-FDA-1088. Where should I keep my medicine? This drug is given in a hospital or clinic and will   not be stored at home. NOTE: This sheet is a summary. It may not cover all possible information. If you have questions about this medicine, talk to your doctor, pharmacist, or health care provider.  2020 Elsevier/Gold Standard (2017-04-29 11:34:41)  

## 2020-01-19 LAB — CANCER ANTIGEN 27.29: CA 27.29: 42.3 U/mL — ABNORMAL HIGH (ref 0.0–38.6)

## 2020-01-23 DIAGNOSIS — Z853 Personal history of malignant neoplasm of breast: Secondary | ICD-10-CM | POA: Diagnosis not present

## 2020-01-28 ENCOUNTER — Other Ambulatory Visit: Payer: Self-pay | Admitting: Internal Medicine

## 2020-02-12 ENCOUNTER — Telehealth: Payer: Self-pay

## 2020-02-12 NOTE — Telephone Encounter (Signed)
Oral Oncology Patient Advocate Encounter   Was successful in securing patient a $6000 grant from [employer] to provide copayment coverage for Ibrance.  This will keep the out of pocket expense at $0.     The billing information is as follows and has been shared with [place].   Member ID: [id number] Group ID: [id number] RxBin: [id number] PCN: [id number] Dates of Eligibility: 17/22 through 02/08/21  Fund name:  [employer].  [place] Patient [place] Phone [phone] Fax [phone] 02/12/2020 2:48 PM

## 2020-02-12 NOTE — Telephone Encounter (Signed)
Oral Oncology Patient Advocate Encounter   Was successful in securing patient an $47 grant from Patient [facility]) to provide copayment coverage for Ibrance.  This will keep the out of pocket expense at $0.     I have spoken with the patient.    The billing information is as follows and has been shared with [place].   Member ID: [id number] Group ID: [id number] RxBin: [id number] Dates of Eligibility: 11/11/19 through 02/07/21  Fund:  [employer]  [place] Patient [place] Phone [phone] Fax [phone] 02/12/2020 2:46 PM

## 2020-02-14 NOTE — Progress Notes (Addendum)
ID: Stacey [name]   DOB: 14-Nov-1940  MR#: [id number]  KGY#:[id number]  Patient Care Team: Biagio Borg, MD as PCP - General (Internal Medicine) Tyler Pita, MD as Consulting Physician (Radiation Oncology) Roseanne Kaufman, MD as Consulting Physician (Orthopedic Surgery) Lyndal Pulley, DO as Consulting Physician (Family Medicine) Laquasia Pincus, Virgie Dad, MD as Consulting Physician (Oncology) OTHER MD:   CHIEF COMPLAINT: metastatic breast cancer  CURRENT TREATMENT: anastrozole; palbociclib/Ibrance; denosumab/Xgeva every 12 weeks; fulvestrant   INTERVAL HISTORY:   Stacey [name] returns today for follow up of her metastatic breast cancer.  She started on anastrozole 06/02/2019. Hot flashes and vaginal dryness are generally not an issue.  She continues on palbociclib, currently at 146m Monday through Friday (not Saturday or Sunday). She does this for 3 weeks and then has a week off.  She is having no side effects from this that she is aware of.  She also receives fulvestrant, every 4 weeks.  She tolerates the injections without any difficulty.  She receives denosumab/Xgeva every 12 weeks, with her most recent dose on 12/21/2019. Her next dose is scheduled 4 weeks from now.  We are following her CA 27-29: There is no trend Lab Results  Component Value Date   CA2729 40.7 (H) 02/15/2020   CA2729 42.3 (H) 01/18/2020   CA2729 39.9 (H) 12/21/2019   CA2729 44.2 (H) 11/15/2019   CA2729 48.5 (H) 10/18/2019    REVIEW OF SYSTEMS: GCalliopeis doing "fine". She enjoyed the holidays but she has a "mixed up family" and of course there are always some issues. She is not exercising regularly. A detailed review of systems was otherwise stable.   COVID 19 VACCINATION STATUS: Fully vaccinated with booster in September 2021   HISTORY OF PRESENT ILLNESS:  From the original intake note:  " Stacey [name] a 80year old [name], who has a history of breast cancer starting in 1997 and in 2013.    On  11/22/95, she underwent a left breast biopsy, which revealed malignant cells.  She had a left breast lumpectomy with re-excision on 11/29/95, with final pathology revealed IDC, grade 2, 2.5 cm, ER 98%, PR 97%, Ki67 8% with one of 15 nodes being positive.  She received chemotherapy, 4 cycles per patient, along with radiation and took Tamoxifen for 7 years following radiation completion.  Records from 1997 limited.    In October 2012, she had a biopsy of the right breast after mammography recommended additional images for a dense area in the right breast.  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  On 11/25/10, she had an MRI that measured the area in the right breast as 2.2 cm.  She started neoadjuvant [name] in November 2012 and has continued it since.  The area of concern measured 1.7 cm on a repeat MRI in March of 2013.  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  Surgical margins were negative with one of one sentinel lymph node positive for metastatic carcinoma.  She then underwent radiation therapy under the care of Dr. MTammi Klippelfrom 10/19/11 to 12/03/11. "   Her subsequent history is as detailed below   PAST MEDICAL HISTORY: Past Medical History:  Diagnosis Date  . ANEMIA-NOS   . ANXIETY   .  Breast cancer ([place]) 1997 L, 2012 R   s/p chemo/xrt  . COPD    resolved  . DIVERTICULOSIS, COLON 2008  . Dizziness   . Family history of breast cancer   . Family history of lung cancer   . Family history of lymphoma   . Family history of pancreatic cancer   . Family history of uterine cancer   . GERD   . Hx of radiation therapy 10/19/11 -12/03/11   right breast  . HYPERLIPIDEMIA   . IRRITABLE BOWEL SYNDROME, HX OF    . Left-sided carotid artery disease (HCC)    moderate left ICA stenosis  . OSTEOARTHRITIS, HAND   . PSVT (paroxysmal supraventricular tachycardia) ([place])    symptomatic on event monitor    PAST SURGICAL HISTORY: Past Surgical History:  Procedure Laterality Date  . ABDOMINAL HYSTERECTOMY    . APPENDECTOMY    . BREAST BIOPSY  11/14/10    r breast: inv, insitu mammary carcinoma w/calcif, er/pr +, her2 -  . BREAST SURGERY     lumpectomy  . CATARACT EXTRACTION     both eyes  . ELECTROPHYSIOLOGIC STUDY N/A 05/10/2015   Procedure: SVT Ablation;  Surgeon: Will Meredith Leeds, MD;  Location: [facility];  Service: Cardiovascular;  Laterality: N/A;  . HERNIA REPAIR    . inguinal herniorrhapy  1984   left  . IR US GUIDE BX ASP/DRAIN  05/26/2019  . rectal fissure repair    . s/p benign breast biopsy  2003   right  . s/p left foot surgury  2009  . 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 spiral fx left foot  2008   no surgury  . TMJ ARTHROPLASTY  1989  . [place]- to remove scar tissue growth   . TONSILLECTOMY      FAMILY HISTORY Family History  Problem Relation Age of Onset  . Hypertension Mother   . Stroke Mother   . Colon polyps Mother   . Diabetes Mother   . Pancreatic cancer Mother 73  . Uterine cancer Mother 26  . Lymphoma Brother        burkitts  . Lung cancer Paternal Uncle   . Lung cancer Maternal Grandmother 36       non-smoker  . Lung cancer Maternal Grandfather   . Breast cancer Cousin        maternal cousin, dx in her mid 39s  . Brain cancer Cousin        maternal cousin's son; dx in his 62s  . Testicular cancer Cousin        maternal cousin's son;   . Breast cancer Cousin        paternal cousin; dx in her 39s  . Breast cancer Cousin        paternal cousin's daughter; dx in 87s; neg genetic testing  . Colon cancer Neg Hx   . Esophageal cancer Neg Hx   . Stomach cancer Neg Hx   . Rectal cancer Neg Hx     GYNECOLOGIC HISTORY:    Menarche age 8, GX P0 (one miscarriage at 5 months). Status post vaginal hysterectomy in the late 1970s, no salpingo-oophorectomy    SOCIAL HISTORY:   The patient lives alone and is divorced. She retired from [employer], were she worked in Physiological scientist.   ADVANCED DIRECTIVES:  Living will in place; the patient's healthcare power of attorney is her stepdaughter, Vladimir Faster, who  can be reached at [place]: Social History   Tobacco Use  . Smoking status: Former Smoker    Packs/day: 1.00    Years: 54.00    Pack years: 54.00    Types: Cigarettes    Quit date: 01/03/2016    Years since quitting: 4.1  . Smokeless tobacco: Never Used  Vaping Use  . Vaping Use: Never used  Substance Use Topics  . Alcohol use: Yes    Comment: rare/ drinks socially  . Drug use: No    Allergies  Allergen Reactions  . Bee Venom Swelling  . Clindamycin/Lincomycin Diarrhea and Nausea And Vomiting  . Codeine Nausea And Vomiting    Current Outpatient Medications  Medication Sig Dispense Refill  . anastrozole (ARIMIDEX) 1 MG tablet Take 1 tablet (1 mg total) by mouth daily. 90 tablet 4  . aspirin 81 MG EC tablet Take 81 mg by mouth daily.      . calcium-vitamin D (OSCAL WITH D) 500-200 MG-UNIT per tablet Take 1 tablet by mouth daily.    . cholecalciferol (VITAMIN D) 1000 UNITS tablet Take 1,000 Units by mouth daily.      . cholestyramine (QUESTRAN) 4 g packet Take 1 packet (4 g total) by mouth 3 (three) times daily. 60 each 12  . clotrimazole-betamethasone (LOTRISONE) cream Apply 1 application topically 2 (two) times daily as needed. 30 g 1  . diclofenac sodium (VOLTAREN) 1 % GEL Apply 4 g topically 4 (four) times daily as needed. 400 g 11  . diphenhydrAMINE (BENADRYL) 25 MG tablet Take 25 mg by mouth every 6 (six) hours as needed (for bee stings).     . fluticasone (FLONASE) 50 MCG/ACT nasal spray Place 2 sprays into both nostrils daily.    . fulvestrant (FASLODEX) 250  MG/5ML injection Inject 250 mg into the muscle once. One injection each buttock over 1-2 minutes. Warm prior to use.    . loratadine (CLARITIN) 10 MG tablet Take 10 mg by mouth daily.    . meloxicam (MOBIC) 15 MG tablet TAKE 1 TABLET BY MOUTH DAILY 90 tablet 1  . Multiple Vitamin (MULTIVITAMIN) capsule Take 1 capsule by mouth daily.      Marland Kitchen omeprazole (PRILOSEC) 20 MG capsule TAKE 1 CAPSULE(20 MG) BY MOUTH DAILY 90 capsule 3  . palbociclib (IBRANCE) 100 MG tablet Take 1 tablet (100 mg total) by mouth Monday through Friday for 3 weeks, then one week off, then repeat 15 tablet 6  . polyethylene glycol (MIRALAX / GLYCOLAX) packet take 1 packet once daily if needed for constipation 30 packet 11  . prochlorperazine (COMPAZINE) 10 MG tablet Take 1 tablet (10 mg total) by mouth every 6 (six) hours as needed for nausea or vomiting. 30 tablet 1  . pseudoephedrine (SUDAFED) 30 MG tablet Take 30 mg by mouth every 4 (four) hours as needed for congestion.     Marland Kitchen telmisartan (MICARDIS) 40 MG tablet TAKE 1 TABLET(40 MG) BY MOUTH DAILY 90 tablet 1  . Tiotropium Bromide Monohydrate (SPIRIVA RESPIMAT) 2.5 MCG/ACT AERS Inhale 2 puffs into the lungs daily. 4 g 11  . triamterene-hydrochlorothiazide (DYAZIDE) 37.5-25 MG capsule Take 1 each (1 capsule total) by mouth in the morning. 90 capsule 4  . umeclidinium-vilanterol (ANORO ELLIPTA) 62.5-25 MCG/INH AEPB Only open the device one time and take deep breath first thing am 1 each 11  . zolpidem (AMBIEN) 10 MG tablet TAKE 1 TABLET(10 MG) BY MOUTH AT BEDTIME AS NEEDED 90 tablet 1  No current facility-administered medications for this visit.    OBJECTIVE: White [name] who appears younger than stated age 32:   02/15/20 1440  BP: (!) 136/52  Pulse: 87  Resp: 16  Temp: (!) 97.3 F (36.3 C)  SpO2: 100%     Body mass index is 28.48 kg/m.     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 Both axillae are benign.  LAB RESULTS: Lab Results  Component Value Date   WBC 3.4 (L) 02/15/2020   NEUTROABS 2.4 02/15/2020   HGB 10.0 (L) 02/15/2020   HCT 28.3 (L) 02/15/2020   MCV 98.3 02/15/2020   PLT 275 02/15/2020      Chemistry      Component Value Date/Time   NA 144 02/15/2020 1402   NA 140 12/09/2015 1409   K 3.7 02/15/2020 1402   K 4.2 12/09/2015 1409   CL 113 (H) 02/15/2020 1402   CL 107 02/02/2012 0939   CO2 19 (L) 02/15/2020 1402   CO2 25 12/09/2015 1409   BUN 23 02/15/2020 1402   BUN 16.7 12/09/2015 1409   CREATININE 1.41 (H) 02/15/2020 1402   CREATININE 1.02 (H) 05/09/2019 1303   CREATININE 0.8 12/09/2015 1409      Component Value Date/Time   CALCIUM 7.7 (L) 02/15/2020 1402   CALCIUM 9.4 12/09/2015 1409   ALKPHOS 59 02/15/2020 1402   ALKPHOS 54 12/09/2015 1409   AST 23 02/15/2020 1402   AST 23 05/09/2019 1303   AST 20 12/09/2015 1409   ALT 16 02/15/2020 1402   ALT 13 05/09/2019 1303   ALT 13 12/09/2015 1409   BILITOT 0.3 02/15/2020 1402   BILITOT 0.4 05/09/2019 1303   BILITOT <0.22 12/09/2015 1409       Lab Results  Component Value Date   LABCA2 56 (H) 12/10/2010    No components found for: [id number]  No results for input(s): INR in the last 168 hours.  Urinalysis    Component Value Date/Time   COLORURINE YELLOW 07/13/2018 0938   APPEARANCEUR [place] (A) 07/13/2018 0938   LABSPEC 1.020 07/13/2018 0938   PHURINE 6.0 07/13/2018 0938   GLUCOSEU NEGATIVE 07/13/2018 0938   HGBUR NEGATIVE 07/13/2018 0938   BILIRUBINUR NEGATIVE 07/13/2018 0938   KETONESUR NEGATIVE 07/13/2018 0938   UROBILINOGEN 0.2 07/13/2018 0938   NITRITE NEGATIVE 07/13/2018 0938   LEUKOCYTESUR TRACE (A) 07/13/2018  0938    STUDIES: No results found.    ASSESSMENT: 80 y.o. Stacey [name]:  LEFT BREAST #1  S/P LEFT breast lumpectomy with re-excision on 11/29/95 for a T2 N1bi stage IIb invasive ductal carcinoma , grade 2, estrogen receptor 98% positive, progesterone receptor 97% positive, Ki67 8%.  #2 status post 4 cycles of doxorubicin and cyclophosphamide,    (i) followed by radiation therapy under the care of Dr. [name].  #3 received tamoxifen for a total of seven years   RIGHT BREAST #4  S/P biopsy of the RIGHT breast upper inner quadrant on 11/14/10 showing invasive ductal carcinoma,, grade 2, estrogen receptor 85% and progesterone receptor 57% positive, Ki67 20%, HER2 not amplified.    #5 started neoadjuvant letrozole in November 2012; switched to tamoxifen as of February 2016 due to osteopenia concerns  #6  S/P right lumpectomy with sentinel lymph node biopsy on 09/03/11 for a ypT2, ypN1a, stage IIB invasive lobular carcinoma, grade 2,estrogen receptor 97% positive, progesterone receptor 12% positive, with no HER-2 amplification.  #7 status post right breast radiation therapy under the care of Dr. [name] from 10/19/2011 to 12/03/2011.   #8 did not meet criteria for genetic testing according to her insurance company.  #9 osteopenia, with a T score of -1.8 on DEXA scan at [facility] 03/07/2013  (a) status post multiple dental extractions and implants  (b) repeat bone density at [facility] 04/02/2015 shows a T score of -2.0  METASTATIC DISEASE: April 2021 #10:  left upper extremity lymphedema led to chest CT scan 05/09/2019 showing a 5.1 cm left subpectoral chest wall mass and thoracic lymphadenopathy   (a) CT biopsy of the chest wall mass 05/25/2019 confirms recurrent breast cancer, strongly estrogen and progesterone receptor positive, HER-2 not amplified  (b) PET scan 05/30/2019 shows a left subpectoral mass measuring 5.4 cm, with significant regional and mediastinal adenopathy, sclerotic left  scapular metastasis, but no liver or lung involvement.  (c) CA 27-29 is informative  #11 anastrozole started 06/02/2019; palbociclib added 06/08/2019  (a) palbociclib taken irregularly for several months secondary to cytopenias  (b), to start palbociclib 100 mg daily 21 on 7 off October 2021  (c) CT of the chest with contrast 11/09/2019 shows mild disease progression (despite a continuing drop in the CA 27-29)  (d) fulvestrant added 11/23/2019  #12 denosumab/Xgeva starting 06/08/2019, to be repeated every 3 months due to hypocalcemia  #13 genetics testing 06/15/2019 through the [facility] Common Hereditary Cancers Panel found no deleterious mutations in APC, ATM, AXIN2, BARD1, BMPR1A, BRCA1, BRCA2, BRIP1, CDH1, CDKN2A (p14ARF), CDKN2A (p16INK4a), CKD4, CHEK2, CTNNA1, DICER1, EPCAM (Deletion/duplication testing only), GREM1 (promoter region deletion/duplication testing only), KIT, MEN1, MLH1, MSH2, MSH3, MSH6, MUTYH, NBN, NF1, NHTL1, PALB2, PDGFRA, PMS2, POLD1, POLE, PTEN, RAD50, RAD51C, RAD51D, RNF43, SDHB, SDHC, SDHD, SMAD4, SMARCA4. STK11, TP53, TSC1, TSC2, and VHL.  The following genes were evaluated for sequence changes only: SDHA and HOXB13 c.251G>A variant only.   PLAN:   Zivah is tolerating her treatments quite well. Her most recent imaging was a CT of the chest October 2021, which showed very slight increase in the left axillary adenopathy. Everything else looks stable. We added fulvestrant at that time. She has had no problems with that so far.  She will return 03/14/2020 for her next fulvestrant dose and she will have her Delton See also at that time. She will see my nurse practitioner March 10, and then she will have treatment in April and again in May. I will see her and they may visit and before that she will have restaging scans specifically a chest CT.  I have encouraged her to walk for exercise. She knows to call for any other issue that may develop before the next visit.  Total encounter  time 25 minutes.[name] C. Salihah Peckham, MD 02/19/20 7:47 AM Medical Oncology and Hematology [facility] [place], [place] [address] Tel. [phone]    Fax. [phone]   I, Wilburn Mylar, am acting as scribe for Dr. Virgie Dad. Kenna Kirn.  I, Lurline Del MD, have reviewed the above documentation for accuracy and completeness, and I agree with the above.  Addendum: Stacey [name] who reads our notes extremely carefully reminded me that her current Ibrance dose is 100 mg not 125 which is what it stated in the interval history.  Also she reminded me  that I asked her to stop her meloxicam to see if we could improve on her kidney function.  She will have labs only January 27 to make sure this is in fact working.  *Total Encounter Time as defined by the Centers for Medicare and Medicaid Services includes, in addition to the face-to-face time of a patient visit (documented in the note above) non-face-to-face time: obtaining and reviewing outside history, ordering and reviewing medications, tests or procedures, care coordination (communications with other health care professionals or caregivers) and documentation in the medical record.

## 2020-02-15 ENCOUNTER — Inpatient Hospital Stay: Payer: Medicare Other | Attending: Adult Health

## 2020-02-15 ENCOUNTER — Inpatient Hospital Stay (HOSPITAL_BASED_OUTPATIENT_CLINIC_OR_DEPARTMENT_OTHER): Payer: Medicare Other | Admitting: Oncology

## 2020-02-15 ENCOUNTER — Inpatient Hospital Stay: Payer: Medicare Other

## 2020-02-15 ENCOUNTER — Other Ambulatory Visit: Payer: Self-pay

## 2020-02-15 VITALS — BP 136/52 | HR 87 | Temp 97.3°F | Resp 16 | Ht 63.0 in | Wt 160.8 lb

## 2020-02-15 DIAGNOSIS — C50212 Malignant neoplasm of upper-inner quadrant of left female breast: Secondary | ICD-10-CM

## 2020-02-15 DIAGNOSIS — C7989 Secondary malignant neoplasm of other specified sites: Secondary | ICD-10-CM | POA: Diagnosis not present

## 2020-02-15 DIAGNOSIS — Z923 Personal history of irradiation: Secondary | ICD-10-CM | POA: Diagnosis not present

## 2020-02-15 DIAGNOSIS — I471 Supraventricular tachycardia, unspecified: Secondary | ICD-10-CM

## 2020-02-15 DIAGNOSIS — C50911 Malignant neoplasm of unspecified site of right female breast: Secondary | ICD-10-CM

## 2020-02-15 DIAGNOSIS — M8580 Other specified disorders of bone density and structure, unspecified site: Secondary | ICD-10-CM | POA: Diagnosis not present

## 2020-02-15 DIAGNOSIS — C50912 Malignant neoplasm of unspecified site of left female breast: Secondary | ICD-10-CM

## 2020-02-15 DIAGNOSIS — C7951 Secondary malignant neoplasm of bone: Secondary | ICD-10-CM | POA: Diagnosis not present

## 2020-02-15 DIAGNOSIS — R350 Frequency of micturition: Secondary | ICD-10-CM | POA: Diagnosis not present

## 2020-02-15 DIAGNOSIS — C773 Secondary and unspecified malignant neoplasm of axilla and upper limb lymph nodes: Secondary | ICD-10-CM | POA: Insufficient documentation

## 2020-02-15 DIAGNOSIS — Z170 Estrogen receptor positive status [ER+]: Secondary | ICD-10-CM | POA: Diagnosis not present

## 2020-02-15 DIAGNOSIS — Z79811 Long term (current) use of aromatase inhibitors: Secondary | ICD-10-CM | POA: Diagnosis not present

## 2020-02-15 DIAGNOSIS — C50211 Malignant neoplasm of upper-inner quadrant of right female breast: Secondary | ICD-10-CM | POA: Diagnosis not present

## 2020-02-15 DIAGNOSIS — R9389 Abnormal findings on diagnostic imaging of other specified body structures: Secondary | ICD-10-CM

## 2020-02-15 DIAGNOSIS — Z79818 Long term (current) use of other agents affecting estrogen receptors and estrogen levels: Secondary | ICD-10-CM | POA: Insufficient documentation

## 2020-02-15 DIAGNOSIS — G5602 Carpal tunnel syndrome, left upper limb: Secondary | ICD-10-CM | POA: Diagnosis not present

## 2020-02-15 LAB — COMPREHENSIVE METABOLIC PANEL
ALT: 16 U/L (ref 0–44)
AST: 23 U/L (ref 15–41)
Albumin: 3.6 g/dL (ref 3.5–5.0)
Alkaline Phosphatase: 59 U/L (ref 38–126)
Anion gap: 12 (ref 5–15)
BUN: 23 mg/dL (ref 8–23)
CO2: 19 mmol/L — ABNORMAL LOW (ref 22–32)
Calcium: 7.7 mg/dL — ABNORMAL LOW (ref 8.9–10.3)
Chloride: 113 mmol/L — ABNORMAL HIGH (ref 98–111)
Creatinine, Ser: 1.41 mg/dL — ABNORMAL HIGH (ref 0.44–1.00)
GFR, Estimated: 38 mL/min — ABNORMAL LOW (ref >60)
Glucose, Bld: 105 mg/dL — ABNORMAL HIGH (ref 70–99)
Potassium: 3.7 mmol/L (ref 3.5–5.1)
Sodium: 144 mmol/L (ref 135–145)
Total Bilirubin: 0.3 mg/dL (ref 0.3–1.2)
Total Protein: 7.1 g/dL (ref 6.5–8.1)

## 2020-02-15 LAB — CBC WITH DIFFERENTIAL/PLATELET
Abs Immature Granulocytes: 0.02 10*3/uL (ref 0.00–0.07)
Basophils Absolute: 0 10*3/uL (ref 0.0–0.1)
Basophils Relative: 1 %
Eosinophils Absolute: 0.1 10*3/uL (ref 0.0–0.5)
Eosinophils Relative: 2 %
HCT: 28.3 % — ABNORMAL LOW (ref 36.0–46.0)
Hemoglobin: 10 g/dL — ABNORMAL LOW (ref 12.0–15.0)
Immature Granulocytes: 1 %
Lymphocytes Relative: 20 %
Lymphs Abs: 0.7 10*3/uL (ref 0.7–4.0)
MCH: 34.7 pg — ABNORMAL HIGH (ref 26.0–34.0)
MCHC: 35.3 g/dL (ref 30.0–36.0)
MCV: 98.3 fL (ref 80.0–100.0)
Monocytes Absolute: 0.3 10*3/uL (ref 0.1–1.0)
Monocytes Relative: 8 %
Neutro Abs: 2.4 10*3/uL (ref 1.7–7.7)
Neutrophils Relative %: 68 %
Platelets: 275 10*3/uL (ref 150–400)
RBC: 2.88 MIL/uL — ABNORMAL LOW (ref 3.87–5.11)
RDW: 14.9 % (ref 11.5–15.5)
WBC: 3.4 10*3/uL — ABNORMAL LOW (ref 4.0–10.5)
nRBC: 0 % (ref 0.0–0.2)

## 2020-02-15 MED ORDER — FULVESTRANT 250 MG/5ML IM SOLN
INTRAMUSCULAR | Status: AC
  Filled 2020-02-15: qty 5

## 2020-02-15 MED ORDER — FULVESTRANT 250 MG/5ML IM SOLN
500.0000 mg | Freq: Once | INTRAMUSCULAR | Status: AC
  Administered 2020-02-15: 500 mg via INTRAMUSCULAR

## 2020-02-15 NOTE — Patient Instructions (Signed)
Fulvestrant injection What is this medicine? FULVESTRANT (ful VES trant) blocks the effects of estrogen. It is used to treat breast cancer. This medicine may be used for other purposes; ask your health care provider or pharmacist if you have questions. COMMON BRAND NAME(S): FASLODEX What should I tell my health care provider before I take this medicine? They need to know if you have any of these conditions:  bleeding disorders  liver disease  low blood counts, like low white cell, platelet, or red cell counts  an unusual or allergic reaction to fulvestrant, other medicines, foods, dyes, or preservatives  pregnant or trying to get pregnant  breast-feeding How should I use this medicine? This medicine is for injection into a muscle. It is usually given by a health care professional in a hospital or clinic setting. Talk to your pediatrician regarding the use of this medicine in children. Special care may be needed. Overdosage: If you think you have taken too much of this medicine contact a poison control center or emergency room at once. NOTE: This medicine is only for you. Do not share this medicine with others. What if I miss a dose? It is important not to miss your dose. Call your doctor or health care professional if you are unable to keep an appointment. What may interact with this medicine?  medicines that treat or prevent blood clots like warfarin, enoxaparin, dalteparin, apixaban, dabigatran, and rivaroxaban This list may not describe all possible interactions. Give your health care provider a list of all the medicines, herbs, non-prescription drugs, or dietary supplements you use. Also tell them if you smoke, drink alcohol, or use illegal drugs. Some items may interact with your medicine. What should I watch for while using this medicine? Your condition will be monitored carefully while you are receiving this medicine. You will need important blood work done while you are taking  this medicine. Do not become pregnant while taking this medicine or for at least 1 year after stopping it. Women of child-bearing potential will need to have a negative pregnancy test before starting this medicine. Women should inform their doctor if they wish to become pregnant or think they might be pregnant. There is a potential for serious side effects to an unborn child. Men should inform their doctors if they wish to father a child. This medicine may lower sperm counts. Talk to your health care professional or pharmacist for more information. Do not breast-feed an infant while taking this medicine or for 1 year after the last dose. What side effects may I notice from receiving this medicine? Side effects that you should report to your doctor or health care professional as soon as possible:  allergic reactions like skin rash, itching or hives, swelling of the face, lips, or tongue  feeling faint or lightheaded, falls  pain, tingling, numbness, or weakness in the legs  signs and symptoms of infection like fever or chills; cough; flu-like symptoms; sore throat  vaginal bleeding Side effects that usually do not require medical attention (report to your doctor or health care professional if they continue or are bothersome):  aches, pains  constipation  diarrhea  headache  hot flashes  nausea, vomiting  pain at site where injected  stomach pain This list may not describe all possible side effects. Call your doctor for medical advice about side effects. You may report side effects to FDA at 1-800-FDA-1088. Where should I keep my medicine? This drug is given in a hospital or clinic and will   not be stored at home. NOTE: This sheet is a summary. It may not cover all possible information. If you have questions about this medicine, talk to your doctor, pharmacist, or health care provider.  2021 Elsevier/Gold Standard (2017-04-29 11:34:41)  

## 2020-02-16 LAB — CANCER ANTIGEN 27.29: CA 27.29: 40.7 U/mL — ABNORMAL HIGH (ref 0.0–38.6)

## 2020-02-19 ENCOUNTER — Encounter: Payer: Self-pay | Admitting: Oncology

## 2020-02-29 ENCOUNTER — Inpatient Hospital Stay: Payer: Medicare Other

## 2020-02-29 ENCOUNTER — Other Ambulatory Visit: Payer: Self-pay

## 2020-02-29 DIAGNOSIS — R9389 Abnormal findings on diagnostic imaging of other specified body structures: Secondary | ICD-10-CM

## 2020-02-29 DIAGNOSIS — C50911 Malignant neoplasm of unspecified site of right female breast: Secondary | ICD-10-CM

## 2020-02-29 DIAGNOSIS — C50211 Malignant neoplasm of upper-inner quadrant of right female breast: Secondary | ICD-10-CM

## 2020-02-29 DIAGNOSIS — C7989 Secondary malignant neoplasm of other specified sites: Secondary | ICD-10-CM | POA: Diagnosis not present

## 2020-02-29 DIAGNOSIS — Z79818 Long term (current) use of other agents affecting estrogen receptors and estrogen levels: Secondary | ICD-10-CM | POA: Diagnosis not present

## 2020-02-29 DIAGNOSIS — C773 Secondary and unspecified malignant neoplasm of axilla and upper limb lymph nodes: Secondary | ICD-10-CM | POA: Diagnosis not present

## 2020-02-29 DIAGNOSIS — C7951 Secondary malignant neoplasm of bone: Secondary | ICD-10-CM | POA: Diagnosis not present

## 2020-02-29 DIAGNOSIS — Z79811 Long term (current) use of aromatase inhibitors: Secondary | ICD-10-CM | POA: Diagnosis not present

## 2020-02-29 LAB — COMPREHENSIVE METABOLIC PANEL
ALT: 14 U/L (ref 0–44)
AST: 21 U/L (ref 15–41)
Albumin: 3.8 g/dL (ref 3.5–5.0)
Alkaline Phosphatase: 65 U/L (ref 38–126)
Anion gap: 10 (ref 5–15)
BUN: 22 mg/dL (ref 8–23)
CO2: 20 mmol/L — ABNORMAL LOW (ref 22–32)
Calcium: 8.3 mg/dL — ABNORMAL LOW (ref 8.9–10.3)
Chloride: 109 mmol/L (ref 98–111)
Creatinine, Ser: 1.18 mg/dL — ABNORMAL HIGH (ref 0.44–1.00)
GFR, Estimated: 47 mL/min — ABNORMAL LOW (ref >60)
Glucose, Bld: 107 mg/dL — ABNORMAL HIGH (ref 70–99)
Potassium: 3.6 mmol/L (ref 3.5–5.1)
Sodium: 139 mmol/L (ref 135–145)
Total Bilirubin: 0.3 mg/dL (ref 0.3–1.2)
Total Protein: 7.6 g/dL (ref 6.5–8.1)

## 2020-02-29 LAB — CBC WITH DIFFERENTIAL/PLATELET
Abs Immature Granulocytes: 0.01 10*3/uL (ref 0.00–0.07)
Basophils Absolute: 0.1 10*3/uL (ref 0.0–0.1)
Basophils Relative: 1 %
Eosinophils Absolute: 0 10*3/uL (ref 0.0–0.5)
Eosinophils Relative: 1 %
HCT: 29.7 % — ABNORMAL LOW (ref 36.0–46.0)
Hemoglobin: 10.3 g/dL — ABNORMAL LOW (ref 12.0–15.0)
Immature Granulocytes: 0 %
Lymphocytes Relative: 28 %
Lymphs Abs: 1 10*3/uL (ref 0.7–4.0)
MCH: 34.9 pg — ABNORMAL HIGH (ref 26.0–34.0)
MCHC: 34.7 g/dL (ref 30.0–36.0)
MCV: 100.7 fL — ABNORMAL HIGH (ref 80.0–100.0)
Monocytes Absolute: 0.5 10*3/uL (ref 0.1–1.0)
Monocytes Relative: 13 %
Neutro Abs: 2 10*3/uL (ref 1.7–7.7)
Neutrophils Relative %: 57 %
Platelets: 177 10*3/uL (ref 150–400)
RBC: 2.95 MIL/uL — ABNORMAL LOW (ref 3.87–5.11)
RDW: 15 % (ref 11.5–15.5)
WBC: 3.5 10*3/uL — ABNORMAL LOW (ref 4.0–10.5)
nRBC: 0 % (ref 0.0–0.2)

## 2020-03-01 NOTE — Telephone Encounter (Signed)
Patient is approved for Ibrance at no charge from [employer] 02/28/20-02/01/21.  [place] [facility] [place] Phone [phone] Fax [phone] 03/01/2020 8:18 AM

## 2020-03-14 ENCOUNTER — Telehealth: Payer: Self-pay

## 2020-03-14 ENCOUNTER — Inpatient Hospital Stay: Payer: Medicare Other | Attending: Adult Health

## 2020-03-14 ENCOUNTER — Other Ambulatory Visit: Payer: Self-pay | Admitting: Internal Medicine

## 2020-03-14 ENCOUNTER — Inpatient Hospital Stay: Payer: Medicare Other

## 2020-03-14 ENCOUNTER — Other Ambulatory Visit: Payer: Self-pay

## 2020-03-14 VITALS — BP 160/71 | HR 95 | Temp 98.3°F | Resp 18

## 2020-03-14 DIAGNOSIS — Z79899 Other long term (current) drug therapy: Secondary | ICD-10-CM | POA: Insufficient documentation

## 2020-03-14 DIAGNOSIS — C50912 Malignant neoplasm of unspecified site of left female breast: Secondary | ICD-10-CM

## 2020-03-14 DIAGNOSIS — Z79818 Long term (current) use of other agents affecting estrogen receptors and estrogen levels: Secondary | ICD-10-CM | POA: Insufficient documentation

## 2020-03-14 DIAGNOSIS — C50212 Malignant neoplasm of upper-inner quadrant of left female breast: Secondary | ICD-10-CM

## 2020-03-14 DIAGNOSIS — C50911 Malignant neoplasm of unspecified site of right female breast: Secondary | ICD-10-CM

## 2020-03-14 DIAGNOSIS — Z170 Estrogen receptor positive status [ER+]: Secondary | ICD-10-CM | POA: Insufficient documentation

## 2020-03-14 DIAGNOSIS — C7951 Secondary malignant neoplasm of bone: Secondary | ICD-10-CM

## 2020-03-14 DIAGNOSIS — C50211 Malignant neoplasm of upper-inner quadrant of right female breast: Secondary | ICD-10-CM | POA: Insufficient documentation

## 2020-03-14 DIAGNOSIS — R9389 Abnormal findings on diagnostic imaging of other specified body structures: Secondary | ICD-10-CM

## 2020-03-14 LAB — CBC WITH DIFFERENTIAL/PLATELET
Abs Immature Granulocytes: 0.02 10*3/uL (ref 0.00–0.07)
Basophils Absolute: 0 10*3/uL (ref 0.0–0.1)
Basophils Relative: 1 %
Eosinophils Absolute: 0.1 10*3/uL (ref 0.0–0.5)
Eosinophils Relative: 1 %
HCT: 28.7 % — ABNORMAL LOW (ref 36.0–46.0)
Hemoglobin: 10.1 g/dL — ABNORMAL LOW (ref 12.0–15.0)
Immature Granulocytes: 1 %
Lymphocytes Relative: 20 %
Lymphs Abs: 0.9 10*3/uL (ref 0.7–4.0)
MCH: 34.8 pg — ABNORMAL HIGH (ref 26.0–34.0)
MCHC: 35.2 g/dL (ref 30.0–36.0)
MCV: 99 fL (ref 80.0–100.0)
Monocytes Absolute: 0.3 10*3/uL (ref 0.1–1.0)
Monocytes Relative: 7 %
Neutro Abs: 3.1 10*3/uL (ref 1.7–7.7)
Neutrophils Relative %: 70 %
Platelets: 317 10*3/uL (ref 150–400)
RBC: 2.9 MIL/uL — ABNORMAL LOW (ref 3.87–5.11)
RDW: 14.1 % (ref 11.5–15.5)
WBC: 4.4 10*3/uL (ref 4.0–10.5)
nRBC: 0 % (ref 0.0–0.2)

## 2020-03-14 LAB — COMPREHENSIVE METABOLIC PANEL
ALT: 14 U/L (ref 0–44)
AST: 20 U/L (ref 15–41)
Albumin: 3.7 g/dL (ref 3.5–5.0)
Alkaline Phosphatase: 63 U/L (ref 38–126)
Anion gap: 13 (ref 5–15)
BUN: 23 mg/dL (ref 8–23)
CO2: 20 mmol/L — ABNORMAL LOW (ref 22–32)
Calcium: 8.5 mg/dL — ABNORMAL LOW (ref 8.9–10.3)
Chloride: 108 mmol/L (ref 98–111)
Creatinine, Ser: 1.22 mg/dL — ABNORMAL HIGH (ref 0.44–1.00)
GFR, Estimated: 45 mL/min — ABNORMAL LOW (ref >60)
Glucose, Bld: 123 mg/dL — ABNORMAL HIGH (ref 70–99)
Potassium: 3.3 mmol/L — ABNORMAL LOW (ref 3.5–5.1)
Sodium: 141 mmol/L (ref 135–145)
Total Bilirubin: 0.4 mg/dL (ref 0.3–1.2)
Total Protein: 7.3 g/dL (ref 6.5–8.1)

## 2020-03-14 MED ORDER — FULVESTRANT 250 MG/5ML IM SOLN
500.0000 mg | Freq: Once | INTRAMUSCULAR | Status: AC
  Administered 2020-03-14: 500 mg via INTRAMUSCULAR

## 2020-03-14 MED ORDER — FULVESTRANT 250 MG/5ML IM SOLN
INTRAMUSCULAR | Status: AC
  Filled 2020-03-14: qty 10

## 2020-03-14 MED ORDER — DENOSUMAB 120 MG/1.7ML ~~LOC~~ SOLN
SUBCUTANEOUS | Status: AC
  Filled 2020-03-14: qty 1.7

## 2020-03-14 MED ORDER — DENOSUMAB 120 MG/1.7ML ~~LOC~~ SOLN
120.0000 mg | Freq: Once | SUBCUTANEOUS | Status: AC
  Administered 2020-03-14: 120 mg via SUBCUTANEOUS

## 2020-03-14 NOTE — Telephone Encounter (Signed)
Please refill as per office routine med refill policy (all routine meds refilled for 3 mo or monthly per pt preference up to one year from last visit, then month to month grace period for 3 mo, then further med refills will have to be denied)  

## 2020-03-14 NOTE — Patient Instructions (Signed)
Denosumab injection What is this medicine? DENOSUMAB (den oh sue mab) slows bone breakdown. Prolia is used to treat osteoporosis in women after menopause and in men, and in people who are taking corticosteroids for 6 months or more. Xgeva is used to treat a high calcium level due to cancer and to prevent bone fractures and other bone problems caused by multiple myeloma or cancer bone metastases. Xgeva is also used to treat giant cell tumor of the bone. This medicine may be used for other purposes; ask your health care provider or pharmacist if you have questions. COMMON BRAND NAME(S): Prolia, XGEVA What should I tell my health care provider before I take this medicine? They need to know if you have any of these conditions:  dental disease  having surgery or tooth extraction  infection  kidney disease  low levels of calcium or Vitamin D in the blood  malnutrition  on hemodialysis  skin conditions or sensitivity  thyroid or parathyroid disease  an unusual reaction to denosumab, other medicines, foods, dyes, or preservatives  pregnant or trying to get pregnant  breast-feeding How should I use this medicine? This medicine is for injection under the skin. It is given by a health care professional in a hospital or clinic setting. A special MedGuide will be given to you before each treatment. Be sure to read this information carefully each time. For Prolia, talk to your pediatrician regarding the use of this medicine in children. Special care may be needed. For Xgeva, talk to your pediatrician regarding the use of this medicine in children. While this drug may be prescribed for children as young as 13 years for selected conditions, precautions do apply. Overdosage: If you think you have taken too much of this medicine contact a poison control center or emergency room at once. NOTE: This medicine is only for you. Do not share this medicine with others. What if I miss a dose? It is  important not to miss your dose. Call your doctor or health care professional if you are unable to keep an appointment. What may interact with this medicine? Do not take this medicine with any of the following medications:  other medicines containing denosumab This medicine may also interact with the following medications:  medicines that lower your chance of fighting infection  steroid medicines like prednisone or cortisone This list may not describe all possible interactions. Give your health care provider a list of all the medicines, herbs, non-prescription drugs, or dietary supplements you use. Also tell them if you smoke, drink alcohol, or use illegal drugs. Some items may interact with your medicine. What should I watch for while using this medicine? Visit your doctor or health care professional for regular checks on your progress. Your doctor or health care professional may order blood tests and other tests to see how you are doing. Call your doctor or health care professional for advice if you get a fever, chills or sore throat, or other symptoms of a cold or flu. Do not treat yourself. This drug may decrease your body's ability to fight infection. Try to avoid being around people who are sick. You should make sure you get enough calcium and vitamin D while you are taking this medicine, unless your doctor tells you not to. Discuss the foods you eat and the vitamins you take with your health care professional. See your dentist regularly. Brush and floss your teeth as directed. Before you have any dental work done, tell your dentist you are   receiving this medicine. Do not become pregnant while taking this medicine or for 5 months after stopping it. Talk with your doctor or health care professional about your birth control options while taking this medicine. Women should inform their doctor if they wish to become pregnant or think they might be pregnant. There is a potential for serious side  effects to an unborn child. Talk to your health care professional or pharmacist for more information. What side effects may I notice from receiving this medicine? Side effects that you should report to your doctor or health care professional as soon as possible:  allergic reactions like skin rash, itching or hives, swelling of the face, lips, or tongue  bone pain  breathing problems  dizziness  jaw pain, especially after dental work  redness, blistering, peeling of the skin  signs and symptoms of infection like fever or chills; cough; sore throat; pain or trouble passing urine  signs of low calcium like fast heartbeat, muscle cramps or muscle pain; pain, tingling, numbness in the hands or feet; seizures  unusual bleeding or bruising  unusually weak or tired Side effects that usually do not require medical attention (report to your doctor or health care professional if they continue or are bothersome):  constipation  diarrhea  headache  joint pain  loss of appetite  muscle pain  runny nose  tiredness  upset stomach This list may not describe all possible side effects. Call your doctor for medical advice about side effects. You may report side effects to FDA at 1-800-FDA-1088. Where should I keep my medicine? This medicine is only given in a clinic, doctor's office, or other health care setting and will not be stored at home. NOTE: This sheet is a summary. It may not cover all possible information. If you have questions about this medicine, talk to your doctor, pharmacist, or health care provider.  2021 Elsevier/Gold Standard (2017-05-28 16:10:44)

## 2020-03-14 NOTE — Telephone Encounter (Signed)
Per Dr. Andrey Spearman to give xgeva with calcium at 8.5

## 2020-03-15 LAB — CANCER ANTIGEN 27.29: CA 27.29: 48.3 U/mL — ABNORMAL HIGH (ref 0.0–38.6)

## 2020-04-10 NOTE — Progress Notes (Signed)
ID: [name]   DOB: [birth year]  MR#: [id number]  XQJ#:[id number]  Patient Care Team: Biagio Borg, MD as PCP - General (Internal Medicine) Tyler Pita, MD as Consulting Physician (Radiation Oncology) Roseanne Kaufman, MD as Consulting Physician (Orthopedic Surgery) Lyndal Pulley, DO as Consulting Physician (Family Medicine) Magrinat, Virgie Dad, MD as Consulting Physician (Oncology) OTHER MD:   CHIEF COMPLAINT: metastatic breast cancer  CURRENT TREATMENT: anastrozole; palbociclib/Ibrance; denosumab/Xgeva every 12 weeks; fulvestrant   INTERVAL HISTORY:   Stacey [name] returns today for follow up of her metastatic breast cancer.  She started on anastrozole 06/02/2019. Hot flashes and vaginal dryness are generally not an issue.  She continues on palbociclib, currently at 150m Monday through Friday (not Saturday or Sunday). She does this for 3 weeks and then has a week off.  She is having no side effects from this that she is aware of.  She also receives fulvestrant, every 4 weeks.  She tolerates the injections without any difficulty.  She receives denosumab/Xgeva every 12 weeks,most recently on 03/14/2020.  We are following her CA 27-29:  Results for PTYKERIA, WAWRZYNIAK(MRN [id number] as of 04/11/2020 13:19  Ref. Range 11/15/2019 14:20 12/21/2019 12:37 01/18/2020 14:20 02/15/2020 14:02 03/14/2020 14:07  CA 27.29 Latest Ref Range: 0.0 - 38.6 U/mL 44.2 (H) 39.9 (H) 42.3 (H) 40.7 (H) 48.3 (H)   REVIEW OF SYSTEMS: Stacey [name] feeling pretty well today.  She notes that she is tolerating her treatments well.  She is following her kidney function (creatinine) closely and today it is 1.16 (down from 1.22 last month).  She notes some cramping in her legs, feet, or toes in the morning.  She also has some increased left arm swelling.    She has no pain.  She has diarrhea she controls with Cholestyramine.  Otherwise, a detailed ROS was non contributory.    COVID 19 VACCINATION STATUS: Fully vaccinated with  booster in September 2021   HISTORY OF PRESENT ILLNESS:  From the original intake note:  " Stacey [name] a 80year old [name], who has a history of breast cancer starting in 1997 and in 2013.    On 11/22/95, she underwent a left breast biopsy, which revealed malignant cells.  She had a left breast lumpectomy with re-excision on 11/29/95, with final pathology revealed IDC, grade 2, 2.5 cm, ER 98%, PR 97%, Ki67 8% with one of 15 nodes being positive.  She received chemotherapy, 4 cycles per patient, along with radiation and took Tamoxifen for 7 years following radiation completion.  Records from 1997 limited.    In October 2012, she had a biopsy of the right breast after mammography recommended additional images for a dense area in the right breast.  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  On 11/25/10, she had an MRI that measured the area in the right breast as 2.2 cm.  She started neoadjuvant [name] in November 2012 and has continued it since.  The area of concern measured 1.7 cm on a repeat MRI in March of 2013.  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  Surgical margins were negative with one of one sentinel lymph node positive for metastatic carcinoma.  She then underwent radiation therapy under the care  of Dr. [name] from 10/19/11 to 12/03/11. "   Her subsequent history is as detailed below   PAST MEDICAL HISTORY: Past Medical History:  Diagnosis Date  . ANEMIA-NOS   . ANXIETY   . Breast cancer ([facility]) 1997 L, 2012 R   s/p chemo/xrt  . COPD    resolved  . DIVERTICULOSIS, COLON 2008  . Dizziness   . Family history of breast cancer   . Family history of lung cancer   . Family history of  lymphoma   . Family history of pancreatic cancer   . Family history of uterine cancer   . GERD   . Hx of radiation therapy 10/19/11 -12/03/11   right breast  . HYPERLIPIDEMIA   . IRRITABLE BOWEL SYNDROME, HX OF   . Left-sided carotid artery disease ([facility])    moderate left ICA stenosis  . OSTEOARTHRITIS, HAND   . PSVT (paroxysmal supraventricular tachycardia) ([place])    symptomatic on event monitor    PAST SURGICAL HISTORY: Past Surgical History:  Procedure Laterality Date  . ABDOMINAL HYSTERECTOMY    . APPENDECTOMY    . BREAST BIOPSY  11/14/10    r breast: inv, insitu mammary carcinoma w/calcif, er/pr +, her2 -  . BREAST SURGERY     lumpectomy  . CATARACT EXTRACTION     both eyes  . ELECTROPHYSIOLOGIC STUDY N/A 05/10/2015   Procedure: SVT Ablation;  Surgeon: Will Meredith Leeds, MD;  Location: [facility];  Service: Cardiovascular;  Laterality: N/A;  . HERNIA REPAIR    . inguinal herniorrhapy  1984   left  . IR US GUIDE BX ASP/DRAIN  05/26/2019  . rectal fissure repair    . s/p benign breast biopsy  2003   right  . s/p left foot surgury  2009  . 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 spiral fx left foot  2008   no surgury  . TMJ ARTHROPLASTY  1989  . [place]- to remove scar tissue growth   . TONSILLECTOMY      FAMILY HISTORY Family History  Problem Relation Age of Onset  . Hypertension Mother   . Stroke Mother   . Colon polyps Mother   . Diabetes Mother   . Pancreatic cancer Mother 28  . Uterine cancer Mother 76  . Lymphoma Brother        burkitts  . Lung cancer Paternal Uncle   . Lung cancer Maternal Grandmother 53       non-smoker  . Lung cancer Maternal Grandfather   . Breast cancer Cousin        maternal cousin, dx in her mid 92s  . Brain cancer Cousin        maternal cousin's son; dx in his 65s  . Testicular cancer Cousin        maternal cousin's son;   . Breast cancer Cousin        paternal cousin; dx in her 20s  .  Breast cancer Cousin        paternal cousin's daughter; dx in 31s; neg genetic testing  . Colon cancer Neg Hx   . Esophageal cancer Neg Hx   . Stomach cancer Neg Hx   . Rectal cancer Neg Hx     GYNECOLOGIC HISTORY:   Menarche age 8, GX P0 (one miscarriage at 5 months). Status post vaginal hysterectomy in the late 1970s, no salpingo-oophorectomy    SOCIAL HISTORY:  The patient lives alone and is divorced. She retired from [employer], were she worked in Physiological scientist.   ADVANCED DIRECTIVES:  Living will in place; the patient's healthcare power of attorney is her stepdaughter, Vladimir Faster, who can be reached at [place]: Social History   Tobacco Use  . Smoking status: Former Smoker    Packs/day: 1.00    Years: 54.00    Pack years: 54.00    Types: Cigarettes    Quit date: 01/03/2016    Years since quitting: 4.2  . Smokeless tobacco: Never Used  Vaping Use  . Vaping Use: Never used  Substance Use Topics  . Alcohol use: Yes    Comment: rare/ drinks socially  . Drug use: No    Allergies  Allergen Reactions  . Bee Venom Swelling  . Clindamycin/Lincomycin Diarrhea and Nausea And Vomiting  . Codeine Nausea And Vomiting    Current Outpatient Medications  Medication Sig Dispense Refill  . anastrozole (ARIMIDEX) 1 MG tablet Take 1 tablet (1 mg total) by mouth daily. 90 tablet 4  . aspirin 81 MG EC tablet Take 81 mg by mouth daily.      . calcium-vitamin D (OSCAL WITH D) 500-200 MG-UNIT per tablet Take 1 tablet by mouth daily.    . cholecalciferol (VITAMIN D) 1000 UNITS tablet Take 1,000 Units by mouth daily.      . cholestyramine (QUESTRAN) 4 g packet Take 1 packet (4 g total) by mouth 3 (three) times daily. 60 each 12  . clotrimazole-betamethasone (LOTRISONE) cream Apply 1 application topically 2 (two) times daily as needed. 30 g 1  . diclofenac sodium (VOLTAREN) 1 % GEL Apply 4 g topically 4 (four) times daily as needed. 400 g 11  . diphenhydrAMINE  (BENADRYL) 25 MG tablet Take 25 mg by mouth every 6 (six) hours as needed (for bee stings).     . fluticasone (FLONASE) 50 MCG/ACT nasal spray Place 2 sprays into both nostrils daily.    . fulvestrant (FASLODEX) 250 MG/5ML injection Inject 250 mg into the muscle once. One injection each buttock over 1-2 minutes. Warm prior to use.    . loratadine (CLARITIN) 10 MG tablet Take 10 mg by mouth daily.    . meloxicam (MOBIC) 15 MG tablet TAKE 1 TABLET BY MOUTH DAILY 90 tablet 1  . Multiple Vitamin (MULTIVITAMIN) capsule Take 1 capsule by mouth daily.      Marland Kitchen omeprazole (PRILOSEC) 20 MG capsule TAKE 1 CAPSULE(20 MG) BY MOUTH DAILY 90 capsule 1  . palbociclib (IBRANCE) 100 MG tablet Take 1 tablet (100 mg total) by mouth Monday through Friday for 3 weeks, then one week off, then repeat 15 tablet 6  . polyethylene glycol (MIRALAX / GLYCOLAX) packet take 1 packet once daily if needed for constipation 30 packet 11  . prochlorperazine (COMPAZINE) 10 MG tablet Take 1 tablet (10 mg total) by mouth every 6 (six) hours as needed for nausea or vomiting. 30 tablet 1  . pseudoephedrine (SUDAFED) 30 MG tablet Take 30 mg by mouth every 4 (four) hours as needed for congestion.     Marland Kitchen telmisartan (MICARDIS) 40 MG tablet TAKE 1 TABLET(40 MG) BY MOUTH DAILY 90 tablet 1  . Tiotropium Bromide Monohydrate (SPIRIVA RESPIMAT) 2.5 MCG/ACT AERS Inhale 2 puffs into the lungs daily. 4 g 11  . triamterene-hydrochlorothiazide (DYAZIDE) 37.5-25 MG capsule Take 1 each (1 capsule total) by mouth in the morning. 90 capsule 4  . umeclidinium-vilanterol (ANORO ELLIPTA) 62.5-25 MCG/INH AEPB  Only open the device one time and take deep breath first thing am 1 each 11  . zolpidem (AMBIEN) 10 MG tablet TAKE 1 TABLET(10 MG) BY MOUTH AT BEDTIME AS NEEDED 90 tablet 1   No current facility-administered medications for this visit.    OBJECTIVE:  Vitals:   04/11/20 1313  BP: (!) 136/58  Pulse: 90  Resp: 18  Temp: 97.7 F (36.5 C)  SpO2: 100%      Body mass index is 28.13 kg/m.    GENERAL: Patient is a well appearing female in no acute distress HEENT:  Sclerae anicteric.  Mask in place. Neck is supple.  NODES:  No cervical, supraclavicular, or axillary lymphadenopathy palpated.  BREAST EXAM:  Deferred. LUNGS:  Clear to auscultation bilaterally.  No wheezes or rhonchi. HEART:  Regular rate and rhythm. No murmur appreciated. ABDOMEN:  Soft, nontender.  Positive, normoactive bowel sounds. No organomegaly palpated. MSK:  No focal spinal tenderness to palpation. Full range of motion bilaterally in the upper extremities. EXTREMITIES: Left arm swelling noted, in lymphedema sleeve.Marland Kitchen   SKIN:  Clear with no obvious rashes or skin changes. No nail dyscrasia. NEURO:  Nonfocal. Well oriented.  Appropriate affect.    LAB RESULTS: Lab Results  Component Value Date   WBC 3.7 (L) 04/11/2020   NEUTROABS PENDING 04/11/2020   HGB 10.3 (L) 04/11/2020   HCT 29.7 (L) 04/11/2020   MCV 102.8 (H) 04/11/2020   PLT 307 04/11/2020      Chemistry      Component Value Date/Time   NA 141 03/14/2020 1407   NA 140 12/09/2015 1409   K 3.3 (L) 03/14/2020 1407   K 4.2 12/09/2015 1409   CL 108 03/14/2020 1407   CL 107 02/02/2012 0939   CO2 20 (L) 03/14/2020 1407   CO2 25 12/09/2015 1409   BUN 23 03/14/2020 1407   BUN 16.7 12/09/2015 1409   CREATININE 1.22 (H) 03/14/2020 1407   CREATININE 1.02 (H) 05/09/2019 1303   CREATININE 0.8 12/09/2015 1409      Component Value Date/Time   CALCIUM 8.5 (L) 03/14/2020 1407   CALCIUM 9.4 12/09/2015 1409   ALKPHOS 63 03/14/2020 1407   ALKPHOS 54 12/09/2015 1409   AST 20 03/14/2020 1407   AST 23 05/09/2019 1303   AST 20 12/09/2015 1409   ALT 14 03/14/2020 1407   ALT 13 05/09/2019 1303   ALT 13 12/09/2015 1409   BILITOT 0.4 03/14/2020 1407   BILITOT 0.4 05/09/2019 1303   BILITOT <0.22 12/09/2015 1409       Lab Results  Component Value Date   LABCA2 56 (H) 12/10/2010    No components found for:  [id number]  No results for input(s): INR in the last 168 hours.  Urinalysis    Component Value Date/Time   COLORURINE YELLOW 07/13/2018 0938   APPEARANCEUR Sl Cloudy (A) 07/13/2018 0938   LABSPEC 1.020 07/13/2018 0938   PHURINE 6.0 07/13/2018 0938   GLUCOSEU NEGATIVE 07/13/2018 0938   HGBUR NEGATIVE 07/13/2018 0938   BILIRUBINUR NEGATIVE 07/13/2018 0938   KETONESUR NEGATIVE 07/13/2018 0938   UROBILINOGEN 0.2 07/13/2018 0938   NITRITE NEGATIVE 07/13/2018 0938   LEUKOCYTESUR TRACE (A) 07/13/2018 0938    STUDIES: No results found.    ASSESSMENT: 80 y.o. [place] woman:  LEFT BREAST #1  S/P LEFT breast lumpectomy with re-excision on 11/29/95 for a T2 N1bi stage IIb invasive ductal carcinoma , grade 2, estrogen receptor 98% positive, progesterone receptor 97% positive, Ki67 8%.  #2  status post 4 cycles of doxorubicin and cyclophosphamide,    (i) followed by radiation therapy under the care of Dr. [name].  #3 received tamoxifen for a total of seven years   RIGHT BREAST #4  S/P biopsy of the RIGHT breast upper inner quadrant on 11/14/10 showing invasive ductal carcinoma,, grade 2, estrogen receptor 85% and progesterone receptor 57% positive, Ki67 20%, HER2 not amplified.    #5 started neoadjuvant letrozole in November 2012; switched to tamoxifen as of February 2016 due to osteopenia concerns  #6  S/P right lumpectomy with sentinel lymph node biopsy on 09/03/11 for a ypT2, ypN1a, stage IIB invasive lobular carcinoma, grade 2,estrogen receptor 97% positive, progesterone receptor 12% positive, with no HER-2 amplification.  #7 status post right breast radiation therapy under the care of Dr. [name] from 10/19/2011 to 12/03/2011.   #8 did not meet criteria for genetic testing according to her insurance company.  #9 osteopenia, with a T score of -1.8 on DEXA scan at [facility] 03/07/2013  (a) status post multiple dental extractions and implants  (b) repeat bone density at [facility]  04/02/2015 shows a T score of -2.0  METASTATIC DISEASE: April 2021 #10:  left upper extremity lymphedema led to chest CT scan 05/09/2019 showing a 5.1 cm left subpectoral chest wall mass and thoracic lymphadenopathy   (a) CT biopsy of the chest wall mass 05/25/2019 confirms recurrent breast cancer, strongly estrogen and progesterone receptor positive, HER-2 not amplified  (b) PET scan 05/30/2019 shows a left subpectoral mass measuring 5.4 cm, with significant regional and mediastinal adenopathy, sclerotic left scapular metastasis, but no liver or lung involvement.  (c) CA 27-29 is informative  #11 anastrozole started 06/02/2019; palbociclib added 06/08/2019  (a) palbociclib taken irregularly for several months secondary to cytopenias  (b), to start palbociclib 100 mg daily 21 on 7 off October 2021  (c) CT of the chest with contrast 11/09/2019 shows mild disease progression (despite a continuing drop in the CA 27-29)  (d) fulvestrant added 11/23/2019  #12 denosumab/Xgeva starting 06/08/2019, to be repeated every 3 months due to hypocalcemia  #13 genetics testing 06/15/2019 through the [facility] Common Hereditary Cancers Panel found no deleterious mutations in APC, ATM, AXIN2, BARD1, BMPR1A, BRCA1, BRCA2, BRIP1, CDH1, CDKN2A (p14ARF), CDKN2A (p16INK4a), CKD4, CHEK2, CTNNA1, DICER1, EPCAM (Deletion/duplication testing only), GREM1 (promoter region deletion/duplication testing only), KIT, MEN1, MLH1, MSH2, MSH3, MSH6, MUTYH, NBN, NF1, NHTL1, PALB2, PDGFRA, PMS2, POLD1, POLE, PTEN, RAD50, RAD51C, RAD51D, RNF43, SDHB, SDHC, SDHD, SMAD4, SMARCA4. STK11, TP53, TSC1, TSC2, and VHL.  The following genes were evaluated for sequence changes only: SDHA and HOXB13 c.251G>A variant only.   PLAN:   Stacey [name] is doing quite well today.  She has no signs of clinical progression and will continue her treatment with Palbociclib, Anastrozole, Fulvestrant, and Xgeva.  She is tolerating these pretty well.    We discussed  her history of mild hypokalemia.  Her potassium has increased to 3.7 today.  We discussed the possibility of magnesium being related to the cramping and I will order this with her lab testing.  She and I discussed foods rich in potassium.    Sharon also notes an increase in left arm swelling.  She is eating TV dinners in the evenings.  We will goa head and get her CT chest in April to evaluate her cancer, and I reviewed she cut back on the processed dinners.    Kc has an appt in April for labs and her injection, she will have a f/u  visit in 8 weeks with Dr. Jana Hakim.  She knows to call for any questions that may arise between now and her next appointment.  We are happy to see her sooner if needed.   Total encounter time 30 minutes.Wilber Bihari, NP 04/11/20 1:40 PM Medical Oncology and Hematology [facility] [place], [place] [address] Tel. [phone]    Fax. [phone]  *Total Encounter Time as defined by the Centers for Medicare and Medicaid Services includes, in addition to the face-to-face time of a patient visit (documented in the note above) non-face-to-face time: obtaining and reviewing outside history, ordering and reviewing medications, tests or procedures, care coordination (communications with other health care professionals or caregivers) and documentation in the medical record.

## 2020-04-11 ENCOUNTER — Other Ambulatory Visit: Payer: Self-pay

## 2020-04-11 ENCOUNTER — Inpatient Hospital Stay: Payer: Medicare Other

## 2020-04-11 ENCOUNTER — Other Ambulatory Visit: Payer: Medicare Other

## 2020-04-11 ENCOUNTER — Inpatient Hospital Stay: Payer: Medicare Other | Attending: Adult Health

## 2020-04-11 ENCOUNTER — Ambulatory Visit: Payer: Medicare Other

## 2020-04-11 ENCOUNTER — Encounter: Payer: Self-pay | Admitting: Adult Health

## 2020-04-11 ENCOUNTER — Inpatient Hospital Stay (HOSPITAL_BASED_OUTPATIENT_CLINIC_OR_DEPARTMENT_OTHER): Payer: Medicare Other | Admitting: Adult Health

## 2020-04-11 VITALS — BP 136/58 | HR 90 | Temp 97.7°F | Resp 18 | Ht 63.0 in | Wt 158.8 lb

## 2020-04-11 DIAGNOSIS — Z170 Estrogen receptor positive status [ER+]: Secondary | ICD-10-CM | POA: Insufficient documentation

## 2020-04-11 DIAGNOSIS — C50212 Malignant neoplasm of upper-inner quadrant of left female breast: Secondary | ICD-10-CM

## 2020-04-11 DIAGNOSIS — M8580 Other specified disorders of bone density and structure, unspecified site: Secondary | ICD-10-CM | POA: Diagnosis not present

## 2020-04-11 DIAGNOSIS — C7951 Secondary malignant neoplasm of bone: Secondary | ICD-10-CM

## 2020-04-11 DIAGNOSIS — M7989 Other specified soft tissue disorders: Secondary | ICD-10-CM | POA: Diagnosis not present

## 2020-04-11 DIAGNOSIS — R590 Localized enlarged lymph nodes: Secondary | ICD-10-CM | POA: Insufficient documentation

## 2020-04-11 DIAGNOSIS — C50911 Malignant neoplasm of unspecified site of right female breast: Secondary | ICD-10-CM

## 2020-04-11 DIAGNOSIS — I890 Lymphedema, not elsewhere classified: Secondary | ICD-10-CM | POA: Diagnosis not present

## 2020-04-11 DIAGNOSIS — C50211 Malignant neoplasm of upper-inner quadrant of right female breast: Secondary | ICD-10-CM | POA: Diagnosis not present

## 2020-04-11 DIAGNOSIS — Z923 Personal history of irradiation: Secondary | ICD-10-CM | POA: Insufficient documentation

## 2020-04-11 DIAGNOSIS — Z79818 Long term (current) use of other agents affecting estrogen receptors and estrogen levels: Secondary | ICD-10-CM | POA: Diagnosis not present

## 2020-04-11 DIAGNOSIS — Z79811 Long term (current) use of aromatase inhibitors: Secondary | ICD-10-CM | POA: Diagnosis not present

## 2020-04-11 DIAGNOSIS — C50912 Malignant neoplasm of unspecified site of left female breast: Secondary | ICD-10-CM

## 2020-04-11 DIAGNOSIS — E876 Hypokalemia: Secondary | ICD-10-CM | POA: Diagnosis not present

## 2020-04-11 DIAGNOSIS — R9389 Abnormal findings on diagnostic imaging of other specified body structures: Secondary | ICD-10-CM

## 2020-04-11 LAB — CBC WITH DIFFERENTIAL/PLATELET
Abs Immature Granulocytes: 0.02 10*3/uL (ref 0.00–0.07)
Basophils Absolute: 0 10*3/uL (ref 0.0–0.1)
Basophils Relative: 0 %
Eosinophils Absolute: 0.1 10*3/uL (ref 0.0–0.5)
Eosinophils Relative: 1 %
HCT: 29.7 % — ABNORMAL LOW (ref 36.0–46.0)
Hemoglobin: 10.3 g/dL — ABNORMAL LOW (ref 12.0–15.0)
Immature Granulocytes: 1 %
Lymphocytes Relative: 21 %
Lymphs Abs: 0.8 10*3/uL (ref 0.7–4.0)
MCH: 35.6 pg — ABNORMAL HIGH (ref 26.0–34.0)
MCHC: 34.7 g/dL (ref 30.0–36.0)
MCV: 102.8 fL — ABNORMAL HIGH (ref 80.0–100.0)
Monocytes Absolute: 0.2 10*3/uL (ref 0.1–1.0)
Monocytes Relative: 6 %
Neutro Abs: 2.6 10*3/uL (ref 1.7–7.7)
Neutrophils Relative %: 71 %
Platelets: 307 10*3/uL (ref 150–400)
RBC: 2.89 MIL/uL — ABNORMAL LOW (ref 3.87–5.11)
RDW: 14.1 % (ref 11.5–15.5)
WBC: 3.7 10*3/uL — ABNORMAL LOW (ref 4.0–10.5)
nRBC: 0 % (ref 0.0–0.2)

## 2020-04-11 LAB — COMPREHENSIVE METABOLIC PANEL
ALT: 15 U/L (ref 0–44)
AST: 21 U/L (ref 15–41)
Albumin: 3.7 g/dL (ref 3.5–5.0)
Alkaline Phosphatase: 60 U/L (ref 38–126)
Anion gap: 10 (ref 5–15)
BUN: 17 mg/dL (ref 8–23)
CO2: 21 mmol/L — ABNORMAL LOW (ref 22–32)
Calcium: 8.9 mg/dL (ref 8.9–10.3)
Chloride: 108 mmol/L (ref 98–111)
Creatinine, Ser: 1.16 mg/dL — ABNORMAL HIGH (ref 0.44–1.00)
GFR, Estimated: 48 mL/min — ABNORMAL LOW (ref >60)
Glucose, Bld: 107 mg/dL — ABNORMAL HIGH (ref 70–99)
Potassium: 3.7 mmol/L (ref 3.5–5.1)
Sodium: 139 mmol/L (ref 135–145)
Total Bilirubin: 0.3 mg/dL (ref 0.3–1.2)
Total Protein: 7.3 g/dL (ref 6.5–8.1)

## 2020-04-11 MED ORDER — FULVESTRANT 250 MG/5ML IM SOLN
INTRAMUSCULAR | Status: AC
  Filled 2020-04-11: qty 10

## 2020-04-11 MED ORDER — FULVESTRANT 250 MG/5ML IM SOLN
500.0000 mg | Freq: Once | INTRAMUSCULAR | Status: AC
  Administered 2020-04-11: 500 mg via INTRAMUSCULAR

## 2020-04-11 NOTE — Patient Instructions (Signed)
Fulvestrant injection What is this medicine? FULVESTRANT (ful VES trant) blocks the effects of estrogen. It is used to treat breast cancer. This medicine may be used for other purposes; ask your health care provider or pharmacist if you have questions. COMMON BRAND NAME(S): FASLODEX What should I tell my health care provider before I take this medicine? They need to know if you have any of these conditions:  bleeding disorders  liver disease  low blood counts, like low white cell, platelet, or red cell counts  an unusual or allergic reaction to fulvestrant, other medicines, foods, dyes, or preservatives  pregnant or trying to get pregnant  breast-feeding How should I use this medicine? This medicine is for injection into a muscle. It is usually given by a health care professional in a hospital or clinic setting. Talk to your pediatrician regarding the use of this medicine in children. Special care may be needed. Overdosage: If you think you have taken too much of this medicine contact a poison control center or emergency room at once. NOTE: This medicine is only for you. Do not share this medicine with others. What if I miss a dose? It is important not to miss your dose. Call your doctor or health care professional if you are unable to keep an appointment. What may interact with this medicine?  medicines that treat or prevent blood clots like warfarin, enoxaparin, dalteparin, apixaban, dabigatran, and rivaroxaban This list may not describe all possible interactions. Give your health care provider a list of all the medicines, herbs, non-prescription drugs, or dietary supplements you use. Also tell them if you smoke, drink alcohol, or use illegal drugs. Some items may interact with your medicine. What should I watch for while using this medicine? Your condition will be monitored carefully while you are receiving this medicine. You will need important blood work done while you are taking  this medicine. Do not become pregnant while taking this medicine or for at least 1 year after stopping it. Women of child-bearing potential will need to have a negative pregnancy test before starting this medicine. Women should inform their doctor if they wish to become pregnant or think they might be pregnant. There is a potential for serious side effects to an unborn child. Men should inform their doctors if they wish to father a child. This medicine may lower sperm counts. Talk to your health care professional or pharmacist for more information. Do not breast-feed an infant while taking this medicine or for 1 year after the last dose. What side effects may I notice from receiving this medicine? Side effects that you should report to your doctor or health care professional as soon as possible:  allergic reactions like skin rash, itching or hives, swelling of the face, lips, or tongue  feeling faint or lightheaded, falls  pain, tingling, numbness, or weakness in the legs  signs and symptoms of infection like fever or chills; cough; flu-like symptoms; sore throat  vaginal bleeding Side effects that usually do not require medical attention (report to your doctor or health care professional if they continue or are bothersome):  aches, pains  constipation  diarrhea  headache  hot flashes  nausea, vomiting  pain at site where injected  stomach pain This list may not describe all possible side effects. Call your doctor for medical advice about side effects. You may report side effects to FDA at 1-800-FDA-1088. Where should I keep my medicine? This drug is given in a hospital or clinic and will   not be stored at home. NOTE: This sheet is a summary. It may not cover all possible information. If you have questions about this medicine, talk to your doctor, pharmacist, or health care provider.  2021 Elsevier/Gold Standard (2017-04-29 11:34:41)  

## 2020-04-12 LAB — CANCER ANTIGEN 27.29: CA 27.29: 44.7 U/mL — ABNORMAL HIGH (ref 0.0–38.6)

## 2020-05-09 ENCOUNTER — Inpatient Hospital Stay: Payer: Medicare Other

## 2020-05-09 ENCOUNTER — Other Ambulatory Visit: Payer: Self-pay

## 2020-05-09 ENCOUNTER — Other Ambulatory Visit: Payer: Self-pay | Admitting: Adult Health

## 2020-05-09 ENCOUNTER — Inpatient Hospital Stay: Payer: Medicare Other | Attending: Adult Health

## 2020-05-09 VITALS — BP 131/66 | HR 97 | Resp 21

## 2020-05-09 DIAGNOSIS — C50912 Malignant neoplasm of unspecified site of left female breast: Secondary | ICD-10-CM

## 2020-05-09 DIAGNOSIS — Z79899 Other long term (current) drug therapy: Secondary | ICD-10-CM | POA: Insufficient documentation

## 2020-05-09 DIAGNOSIS — C50212 Malignant neoplasm of upper-inner quadrant of left female breast: Secondary | ICD-10-CM

## 2020-05-09 DIAGNOSIS — Z79818 Long term (current) use of other agents affecting estrogen receptors and estrogen levels: Secondary | ICD-10-CM | POA: Insufficient documentation

## 2020-05-09 DIAGNOSIS — R9389 Abnormal findings on diagnostic imaging of other specified body structures: Secondary | ICD-10-CM

## 2020-05-09 DIAGNOSIS — Z170 Estrogen receptor positive status [ER+]: Secondary | ICD-10-CM

## 2020-05-09 DIAGNOSIS — C50211 Malignant neoplasm of upper-inner quadrant of right female breast: Secondary | ICD-10-CM | POA: Insufficient documentation

## 2020-05-09 DIAGNOSIS — C7951 Secondary malignant neoplasm of bone: Secondary | ICD-10-CM

## 2020-05-09 DIAGNOSIS — C50911 Malignant neoplasm of unspecified site of right female breast: Secondary | ICD-10-CM

## 2020-05-09 LAB — CBC WITH DIFFERENTIAL/PLATELET
Abs Immature Granulocytes: 0.02 10*3/uL (ref 0.00–0.07)
Basophils Absolute: 0 10*3/uL (ref 0.0–0.1)
Basophils Relative: 1 %
Eosinophils Absolute: 0.1 10*3/uL (ref 0.0–0.5)
Eosinophils Relative: 1 %
HCT: 29.7 % — ABNORMAL LOW (ref 36.0–46.0)
Hemoglobin: 10.5 g/dL — ABNORMAL LOW (ref 12.0–15.0)
Immature Granulocytes: 1 %
Lymphocytes Relative: 25 %
Lymphs Abs: 0.9 10*3/uL (ref 0.7–4.0)
MCH: 35.2 pg — ABNORMAL HIGH (ref 26.0–34.0)
MCHC: 35.4 g/dL (ref 30.0–36.0)
MCV: 99.7 fL (ref 80.0–100.0)
Monocytes Absolute: 0.4 10*3/uL (ref 0.1–1.0)
Monocytes Relative: 10 %
Neutro Abs: 2.2 10*3/uL (ref 1.7–7.7)
Neutrophils Relative %: 62 %
Platelets: 189 10*3/uL (ref 150–400)
RBC: 2.98 MIL/uL — ABNORMAL LOW (ref 3.87–5.11)
RDW: 13.4 % (ref 11.5–15.5)
WBC: 3.5 10*3/uL — ABNORMAL LOW (ref 4.0–10.5)
nRBC: 0 % (ref 0.0–0.2)

## 2020-05-09 LAB — COMPREHENSIVE METABOLIC PANEL
ALT: 14 U/L (ref 0–44)
AST: 21 U/L (ref 15–41)
Albumin: 4 g/dL (ref 3.5–5.0)
Alkaline Phosphatase: 59 U/L (ref 38–126)
Anion gap: 14 (ref 5–15)
BUN: 25 mg/dL — ABNORMAL HIGH (ref 8–23)
CO2: 16 mmol/L — ABNORMAL LOW (ref 22–32)
Calcium: 8.6 mg/dL — ABNORMAL LOW (ref 8.9–10.3)
Chloride: 109 mmol/L (ref 98–111)
Creatinine, Ser: 1.25 mg/dL — ABNORMAL HIGH (ref 0.44–1.00)
GFR, Estimated: 44 mL/min — ABNORMAL LOW (ref >60)
Glucose, Bld: 101 mg/dL — ABNORMAL HIGH (ref 70–99)
Potassium: 3.9 mmol/L (ref 3.5–5.1)
Sodium: 139 mmol/L (ref 135–145)
Total Bilirubin: 0.3 mg/dL (ref 0.3–1.2)
Total Protein: 7.6 g/dL (ref 6.5–8.1)

## 2020-05-09 LAB — MAGNESIUM: Magnesium: 0.9 mg/dL — CL (ref 1.7–2.4)

## 2020-05-09 MED ORDER — FULVESTRANT 250 MG/5ML IM SOLN
INTRAMUSCULAR | Status: AC
  Filled 2020-05-09: qty 10

## 2020-05-09 MED ORDER — FULVESTRANT 250 MG/5ML IM SOLN
500.0000 mg | Freq: Once | INTRAMUSCULAR | Status: AC
Start: 2020-05-09 — End: 2020-05-09
  Administered 2020-05-09: 500 mg via INTRAMUSCULAR

## 2020-05-09 NOTE — Progress Notes (Signed)
CRITICAL VALUE STICKER  CRITICAL VALUE:MG 0.9  DATE & TIME NOTIFIED: 05/09/20 3:20  MD NOTIFIED: Dr Jana Hakim  TIME OF NOTIFICATION:3:25

## 2020-05-09 NOTE — Progress Notes (Signed)
Spoke with patient about her hypomagnesemia.  I placed an order of 6gm of IV magnesium after discussing replacement with our pharmacist, Carloyn Manner.  I let Stacey [name] know someone will reach out to her to get her scheduled.  High priority schedule message sent.  Patient has loose BMs so will hold off on oral mag repletion unless we are delayed in getting her in for a prompt appointment.  Wilber Bihari, NP

## 2020-05-09 NOTE — Patient Instructions (Signed)
Fulvestrant injection What is this medicine? FULVESTRANT (ful VES trant) blocks the effects of estrogen. It is used to treat breast cancer. This medicine may be used for other purposes; ask your health care provider or pharmacist if you have questions. COMMON BRAND NAME(S): FASLODEX What should I tell my health care provider before I take this medicine? They need to know if you have any of these conditions:  bleeding disorders  liver disease  low blood counts, like low white cell, platelet, or red cell counts  an unusual or allergic reaction to fulvestrant, other medicines, foods, dyes, or preservatives  pregnant or trying to get pregnant  breast-feeding How should I use this medicine? This medicine is for injection into a muscle. It is usually given by a health care professional in a hospital or clinic setting. Talk to your pediatrician regarding the use of this medicine in children. Special care may be needed. Overdosage: If you think you have taken too much of this medicine contact a poison control center or emergency room at once. NOTE: This medicine is only for you. Do not share this medicine with others. What if I miss a dose? It is important not to miss your dose. Call your doctor or health care professional if you are unable to keep an appointment. What may interact with this medicine?  medicines that treat or prevent blood clots like warfarin, enoxaparin, dalteparin, apixaban, dabigatran, and rivaroxaban This list may not describe all possible interactions. Give your health care provider a list of all the medicines, herbs, non-prescription drugs, or dietary supplements you use. Also tell them if you smoke, drink alcohol, or use illegal drugs. Some items may interact with your medicine. What should I watch for while using this medicine? Your condition will be monitored carefully while you are receiving this medicine. You will need important blood work done while you are taking  this medicine. Do not become pregnant while taking this medicine or for at least 1 year after stopping it. Women of child-bearing potential will need to have a negative pregnancy test before starting this medicine. Women should inform their doctor if they wish to become pregnant or think they might be pregnant. There is a potential for serious side effects to an unborn child. Men should inform their doctors if they wish to father a child. This medicine may lower sperm counts. Talk to your health care professional or pharmacist for more information. Do not breast-feed an infant while taking this medicine or for 1 year after the last dose. What side effects may I notice from receiving this medicine? Side effects that you should report to your doctor or health care professional as soon as possible:  allergic reactions like skin rash, itching or hives, swelling of the face, lips, or tongue  feeling faint or lightheaded, falls  pain, tingling, numbness, or weakness in the legs  signs and symptoms of infection like fever or chills; cough; flu-like symptoms; sore throat  vaginal bleeding Side effects that usually do not require medical attention (report to your doctor or health care professional if they continue or are bothersome):  aches, pains  constipation  diarrhea  headache  hot flashes  nausea, vomiting  pain at site where injected  stomach pain This list may not describe all possible side effects. Call your doctor for medical advice about side effects. You may report side effects to FDA at 1-800-FDA-1088. Where should I keep my medicine? This drug is given in a hospital or clinic and will   not be stored at home. NOTE: This sheet is a summary. It may not cover all possible information. If you have questions about this medicine, talk to your doctor, pharmacist, or health care provider.  2021 Elsevier/Gold Standard (2017-04-29 11:34:41)  

## 2020-05-10 ENCOUNTER — Ambulatory Visit: Payer: Medicare Other

## 2020-05-10 ENCOUNTER — Telehealth: Payer: Self-pay

## 2020-05-10 ENCOUNTER — Telehealth: Payer: Self-pay | Admitting: Oncology

## 2020-05-10 LAB — CANCER ANTIGEN 27.29: CA 27.29: 37.8 U/mL (ref 0.0–38.6)

## 2020-05-10 NOTE — Telephone Encounter (Signed)
Attempted to call pt to explain importance of Mag infusion Monday 4/11. Pt informed scheduler she could go 4/12, 4/13, 4/14, 4/15 but not 4/11. Pt did not answer, LVM for her to return call

## 2020-05-10 NOTE — Telephone Encounter (Signed)
Scheduled per 4/7 sch msg. Called pt and left a msg

## 2020-05-11 ENCOUNTER — Inpatient Hospital Stay: Payer: Medicare Other

## 2020-05-13 ENCOUNTER — Telehealth: Payer: Self-pay | Admitting: Oncology

## 2020-05-13 ENCOUNTER — Inpatient Hospital Stay: Payer: Medicare Other

## 2020-05-13 NOTE — Telephone Encounter (Signed)
Called and confirmed 4/12 appt with pt

## 2020-05-14 ENCOUNTER — Inpatient Hospital Stay: Payer: Medicare Other

## 2020-05-14 ENCOUNTER — Other Ambulatory Visit: Payer: Self-pay

## 2020-05-14 DIAGNOSIS — C50211 Malignant neoplasm of upper-inner quadrant of right female breast: Secondary | ICD-10-CM | POA: Diagnosis not present

## 2020-05-14 DIAGNOSIS — Z79818 Long term (current) use of other agents affecting estrogen receptors and estrogen levels: Secondary | ICD-10-CM | POA: Diagnosis not present

## 2020-05-14 DIAGNOSIS — Z170 Estrogen receptor positive status [ER+]: Secondary | ICD-10-CM | POA: Diagnosis not present

## 2020-05-14 DIAGNOSIS — Z79899 Other long term (current) drug therapy: Secondary | ICD-10-CM | POA: Diagnosis not present

## 2020-05-14 MED ORDER — SODIUM CHLORIDE 0.9 % IV SOLN: 6.0000 g | Freq: Once | INTRAVENOUS | Status: DC

## 2020-05-14 MED ORDER — SODIUM CHLORIDE 0.9 % IV SOLN
INTRAVENOUS | Status: DC
  Filled 2020-05-14 (×2): qty 250

## 2020-05-14 MED ORDER — MAGNESIUM SULFATE 2 GM/50ML IV SOLN
2.0000 g | INTRAVENOUS | Status: AC
  Administered 2020-05-14 (×3): 2 g via INTRAVENOUS
  Filled 2020-05-14: qty 50

## 2020-05-14 NOTE — Patient Instructions (Signed)
Magnesium Sulfate injection What is this medicine? MAGNESIUM SULFATE (mag NEE zee um SUL fate) is an electrolyte injection commonly used to treat low magnesium levels in your blood. It is also used to prevent or control seizures in women with preeclampsia or eclampsia. This medicine may be used for other purposes; ask your health care provider or pharmacist if you have questions. What should I tell my health care provider before I take this medicine? They need to know if you have any of these conditions:  heart disease  history of irregular heart beat  kidney disease  an unusual or allergic reaction to magnesium sulfate, medicines, foods, dyes, or preservatives  pregnant or trying to get pregnant  breast-feeding How should I use this medicine? This medicine is for infusion into a vein. It is given by a health care professional in a hospital or clinic setting. Talk to your pediatrician regarding the use of this medicine in children. While this drug may be prescribed for selected conditions, precautions do apply. Overdosage: If you think you have taken too much of this medicine contact a poison control center or emergency room at once. NOTE: This medicine is only for you. Do not share this medicine with others. What if I miss a dose? This does not apply. What may interact with this medicine? This medicine may interact with the following medications:  certain medicines for anxiety or sleep  certain medicines for seizures like phenobarbital  digoxin  medicines that relax muscles for surgery  narcotic medicines for pain This list may not describe all possible interactions. Give your health care provider a list of all the medicines, herbs, non-prescription drugs, or dietary supplements you use. Also tell them if you smoke, drink alcohol, or use illegal drugs. Some items may interact with your medicine. What should I watch for while using this medicine? Your condition will be  monitored carefully while you are receiving this medicine. You may need blood work done while you are receiving this medicine. What side effects may I notice from receiving this medicine? Side effects that you should report to your doctor or health care professional as soon as possible:  allergic reactions like skin rash, itching or hives, swelling of the face, lips, or tongue  facial flushing  muscle weakness  signs and symptoms of low blood pressure like dizziness; feeling faint or lightheaded, falls; unusually weak or tired  signs and symptoms of a dangerous change in heartbeat or heart rhythm like chest pain; dizziness; fast or irregular heartbeat; palpitations; breathing problems  sweating This list may not describe all possible side effects. Call your doctor for medical advice about side effects. You may report side effects to FDA at 1-800-FDA-1088. Where should I keep my medicine? This drug is given in a hospital or clinic and will not be stored at home. NOTE: This sheet is a summary. It may not cover all possible information. If you have questions about this medicine, talk to your doctor, pharmacist, or health care provider.  2021 Elsevier/Gold Standard (2015-08-07 12:31:42)  

## 2020-05-15 ENCOUNTER — Encounter: Payer: Self-pay | Admitting: Adult Health

## 2020-05-17 ENCOUNTER — Ambulatory Visit: Payer: Medicare Other

## 2020-05-28 ENCOUNTER — Other Ambulatory Visit: Payer: Self-pay | Admitting: Pharmacist

## 2020-05-29 ENCOUNTER — Other Ambulatory Visit: Payer: Self-pay | Admitting: Oncology

## 2020-05-29 DIAGNOSIS — C50212 Malignant neoplasm of upper-inner quadrant of left female breast: Secondary | ICD-10-CM

## 2020-05-29 DIAGNOSIS — Z170 Estrogen receptor positive status [ER+]: Secondary | ICD-10-CM

## 2020-05-29 MED ORDER — MAGNESIUM CHLORIDE 64 MG PO TABS: 2.0000 | ORAL_TABLET | ORAL | 6 refills | Status: AC

## 2020-05-30 ENCOUNTER — Other Ambulatory Visit: Payer: Self-pay

## 2020-05-30 ENCOUNTER — Encounter (HOSPITAL_COMMUNITY): Payer: Self-pay

## 2020-05-30 ENCOUNTER — Ambulatory Visit (HOSPITAL_COMMUNITY)
Admission: RE | Admit: 2020-05-30 | Discharge: 2020-05-30 | Disposition: A | Discharge status: Home / Self Care | Payer: Medicare Other | Source: Ambulatory Visit | Attending: Oncology | Admitting: Oncology

## 2020-05-30 DIAGNOSIS — C50912 Malignant neoplasm of unspecified site of left female breast: Secondary | ICD-10-CM | POA: Insufficient documentation

## 2020-05-30 DIAGNOSIS — G5602 Carpal tunnel syndrome, left upper limb: Secondary | ICD-10-CM | POA: Insufficient documentation

## 2020-05-30 DIAGNOSIS — C50212 Malignant neoplasm of upper-inner quadrant of left female breast: Secondary | ICD-10-CM | POA: Insufficient documentation

## 2020-05-30 DIAGNOSIS — Z170 Estrogen receptor positive status [ER+]: Secondary | ICD-10-CM | POA: Insufficient documentation

## 2020-05-30 DIAGNOSIS — K449 Diaphragmatic hernia without obstruction or gangrene: Secondary | ICD-10-CM | POA: Diagnosis not present

## 2020-05-30 DIAGNOSIS — I471 Supraventricular tachycardia: Secondary | ICD-10-CM | POA: Diagnosis not present

## 2020-05-30 DIAGNOSIS — C50919 Malignant neoplasm of unspecified site of unspecified female breast: Secondary | ICD-10-CM | POA: Diagnosis not present

## 2020-05-30 DIAGNOSIS — C7951 Secondary malignant neoplasm of bone: Secondary | ICD-10-CM | POA: Insufficient documentation

## 2020-05-30 DIAGNOSIS — R350 Frequency of micturition: Secondary | ICD-10-CM | POA: Diagnosis not present

## 2020-05-30 DIAGNOSIS — J439 Emphysema, unspecified: Secondary | ICD-10-CM | POA: Diagnosis not present

## 2020-05-30 DIAGNOSIS — I2510 Atherosclerotic heart disease of native coronary artery without angina pectoris: Secondary | ICD-10-CM | POA: Diagnosis not present

## 2020-05-30 MED ORDER — IOHEXOL 300 MG/ML  SOLN
60.0000 mL | Freq: Once | INTRAMUSCULAR | Status: AC | PRN
  Administered 2020-05-30: 60 mL via INTRAVENOUS

## 2020-06-03 ENCOUNTER — Encounter: Payer: Self-pay | Admitting: Oncology

## 2020-06-03 ENCOUNTER — Other Ambulatory Visit: Payer: Self-pay | Admitting: Oncology

## 2020-06-05 NOTE — Progress Notes (Signed)
ID: Stacey [name]   DOB: February 16, 1940  MR#: [id number]  QTM#:[id number]  Patient Care Team: Biagio Borg, MD as PCP - General (Internal Medicine) Tyler Pita, MD as Consulting Physician (Radiation Oncology) Roseanne Kaufman, MD as Consulting Physician (Orthopedic Surgery) Lyndal Pulley, DO as Consulting Physician (Family Medicine) Ilissa Rosner, Virgie Dad, MD as Consulting Physician (Oncology) OTHER MD:   CHIEF COMPLAINT: metastatic breast cancer  CURRENT TREATMENT: anastrozole; palbociclib/Ibrance; denosumab/Xgeva every 12 weeks; fulvestrant   INTERVAL HISTORY:   [name] returns today for follow up of her metastatic breast cancer.  Since her last visit, she underwent restaging chest CT on 05/30/2020 showing: mild decrease in size of left chest wall/axillary mass and mediastinal lymphadenopathy; no new or progressive metastatic disease.  She started on anastrozole 06/02/2019. Hot flashes and vaginal dryness so far are not an issue.  She continues on palbociclib, currently at 164m Monday through Friday (not Saturday or Sunday).  She is having no side effects from this that she is aware of.  She also receives fulvestrant, every 4 weeks.  She tolerates the injections without any difficulty.  She receives denosumab/Xgeva every 12 weeks, most recently on 03/14/2020.  We are following her CA 27-29:  Lab Results  Component Value Date   CA2729 37.8 05/09/2020   CA2729 44.7 (H) 04/11/2020   CA2729 48.3 (H) 03/14/2020   CA2729 40.7 (H) 02/15/2020   CA2729 42.3 (H) 01/18/2020    REVIEW OF SYSTEMS: GSherylanncomplained of leg cramps and was found to have a very low magnesium level.  She received intravenous replacement and has started oral magnesium replacement twice a day.  She however did not have the specific type of magnesium she is taking on hand.  She does have diarrhea daily, but that is unrelated to starting the magnesium.  It was present previously and likely is related to the palbociclib.   She is taking Questran most mornings and and has occasionally taken it twice a day.  She is more concerned about constipation from too much Questran than about diarrhea which she is used to and knows how to manage.  A detailed review of systems today was otherwise stable  COVID 19 VACCINATION STATUS: Fully vaccinated with booster in September 2021   HISTORY OF PRESENT ILLNESS:  From the original intake note:  " GRaivyn [name] a 80year old [name], who has a history of breast cancer starting in 1997 and in 2013.    On 11/22/95, she underwent a left breast biopsy, which revealed malignant cells.  She had a left breast lumpectomy with re-excision on 11/29/95, with final pathology revealed IDC, grade 2, 2.5 cm, ER 98%, PR 97%, Ki67 8% with one of 15 nodes being positive.  She received chemotherapy, 4 cycles per patient, along with radiation and took Tamoxifen for 7 years following radiation completion.  Records from 1997 limited.    In October 2012, she had a biopsy of the right breast after mammography recommended additional images for a dense area in the right breast.  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  On 11/25/10, she had an MRI that measured the area in the right breast as 2.2 cm.  She started neoadjuvant [name] in November 2012 and has continued it since.  The area of concern measured 1.7 cm on a repeat MRI in March of 2013.  On 09/03/11, she underwent  a right lumpectomy with sentinel lymph node biopsy with final pathology resulting invasive lobular carcinoma with calcifications, grade 2, 2.5 cm wit lobular carcinoma in situ.  Surgical margins were negative with one of one sentinel lymph node positive for metastatic carcinoma.  She then underwent radiation therapy under the care of Dr. [name] from 10/19/11 to 12/03/11. "   Her subsequent history is as  detailed below   PAST MEDICAL HISTORY: Past Medical History:  Diagnosis Date  . ANEMIA-NOS   . ANXIETY   . Breast cancer ([place]) 1997 L, 2012 R   s/p chemo/xrt  . COPD    resolved  . DIVERTICULOSIS, COLON 2008  . Dizziness   . Family history of breast cancer   . Family history of lung cancer   . Family history of lymphoma   . Family history of pancreatic cancer   . Family history of uterine cancer   . GERD   . Hx of radiation therapy 10/19/11 -12/03/11   right breast  . HYPERLIPIDEMIA   . IRRITABLE BOWEL SYNDROME, HX OF   . Left-sided carotid artery disease (HCC)    moderate left ICA stenosis  . OSTEOARTHRITIS, HAND   . PSVT (paroxysmal supraventricular tachycardia) ([place])    symptomatic on event monitor    PAST SURGICAL HISTORY: Past Surgical History:  Procedure Laterality Date  . ABDOMINAL HYSTERECTOMY    . APPENDECTOMY    . BREAST BIOPSY  11/14/10    r breast: inv, insitu mammary carcinoma w/calcif, er/pr +, her2 -  . BREAST SURGERY     lumpectomy  . CATARACT EXTRACTION     both eyes  . ELECTROPHYSIOLOGIC STUDY N/A 05/10/2015   Procedure: SVT Ablation;  Surgeon: Will Meredith Leeds, MD;  Location: [facility];  Service: Cardiovascular;  Laterality: N/A;  . HERNIA REPAIR    . inguinal herniorrhapy  1984   left  . IR US GUIDE BX ASP/DRAIN  05/26/2019  . rectal fissure repair    . s/p benign breast biopsy  2003   right  . s/p left foot surgury  2009  . 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 spiral fx left foot  2008   no surgury  . TMJ ARTHROPLASTY  1989  . [place]- to remove scar tissue growth   . TONSILLECTOMY      FAMILY HISTORY Family History  Problem Relation Age of Onset  . Hypertension Mother   . Stroke Mother   . Colon polyps Mother   . Diabetes Mother   . Pancreatic cancer Mother 8  . Uterine cancer Mother 36  . Lymphoma Brother        burkitts  . Lung cancer Paternal Uncle   . Lung cancer Maternal Grandmother  8       non-smoker  . Lung cancer Maternal Grandfather   . Breast cancer Cousin        maternal cousin, dx in her mid 31s  . Brain cancer Cousin        maternal cousin's son; dx in his 56s  . Testicular cancer Cousin        maternal cousin's son;   . Breast cancer Cousin        paternal cousin; dx in her 59s  . Breast cancer Cousin        paternal cousin's daughter; dx in 38s; neg genetic testing  . Colon cancer Neg Hx   . Esophageal cancer  Neg Hx   . Stomach cancer Neg Hx   . Rectal cancer Neg Hx     GYNECOLOGIC HISTORY:   Menarche age 14, GX P0 (one miscarriage at 5 months). Status post vaginal hysterectomy in the late 1970s, no salpingo-oophorectomy    SOCIAL HISTORY:   The patient lives alone and is divorced. She retired from [employer], were she worked in Physiological scientist.   ADVANCED DIRECTIVES:  Living will in place; the patient's healthcare power of attorney is her stepdaughter, Vladimir Faster, who can be reached at [facility]: Social History   Tobacco Use  . Smoking status: Former Smoker    Packs/day: 1.00    Years: 54.00    Pack years: 54.00    Types: Cigarettes    Quit date: 01/03/2016    Years since quitting: 4.4  . Smokeless tobacco: Never Used  Vaping Use  . Vaping Use: Never used  Substance Use Topics  . Alcohol use: Yes    Comment: rare/ drinks socially  . Drug use: No    Allergies  Allergen Reactions  . Bee Venom Swelling  . Clindamycin/Lincomycin Diarrhea and Nausea And Vomiting  . Codeine Nausea And Vomiting    Current Outpatient Medications  Medication Sig Dispense Refill  . anastrozole (ARIMIDEX) 1 MG tablet Take 1 tablet (1 mg total) by mouth daily. 90 tablet 4  . aspirin 81 MG EC tablet Take 81 mg by mouth daily.      . calcium-vitamin D (OSCAL WITH D) 500-200 MG-UNIT per tablet Take 1 tablet by mouth daily.    . cholecalciferol (VITAMIN D) 1000 UNITS tablet Take 1,000 Units by mouth daily.      . cholestyramine  (QUESTRAN) 4 g packet Take 1 packet (4 g total) by mouth 3 (three) times daily. 60 each 12  . clotrimazole-betamethasone (LOTRISONE) cream Apply 1 application topically 2 (two) times daily as needed. 30 g 1  . diclofenac sodium (VOLTAREN) 1 % GEL Apply 4 g topically 4 (four) times daily as needed. 400 g 11  . diphenhydrAMINE (BENADRYL) 25 MG tablet Take 25 mg by mouth every 6 (six) hours as needed (for bee stings).     . fluticasone (FLONASE) 50 MCG/ACT nasal spray Place 2 sprays into both nostrils daily.    . fulvestrant (FASLODEX) 250 MG/5ML injection Inject 250 mg into the muscle once. One injection each buttock over 1-2 minutes. Warm prior to use.    . loratadine (CLARITIN) 10 MG tablet Take 10 mg by mouth daily.    . Multiple Vitamin (MULTIVITAMIN) capsule Take 1 capsule by mouth daily.      Marland Kitchen omeprazole (PRILOSEC) 20 MG capsule TAKE 1 CAPSULE(20 MG) BY MOUTH DAILY 90 capsule 1  . palbociclib (IBRANCE) 100 MG tablet Take 1 tablet (100 mg total) by mouth Monday through Friday for 3 weeks, then one week off, then repeat 15 tablet 6  . polyethylene glycol (MIRALAX / GLYCOLAX) packet take 1 packet once daily if needed for constipation 30 packet 11  . prochlorperazine (COMPAZINE) 10 MG tablet Take 1 tablet (10 mg total) by mouth every 6 (six) hours as needed for nausea or vomiting. 30 tablet 1  . pseudoephedrine (SUDAFED) 30 MG tablet Take 30 mg by mouth every 4 (four) hours as needed for congestion.     Marland Kitchen telmisartan (MICARDIS) 40 MG tablet TAKE 1 TABLET(40 MG) BY MOUTH DAILY 90 tablet 1  . Tiotropium Bromide Monohydrate (SPIRIVA RESPIMAT) 2.5 MCG/ACT AERS Inhale 2 puffs into  the lungs daily. 4 g 11  . triamterene-hydrochlorothiazide (DYAZIDE) 37.5-25 MG capsule Take 1 each (1 capsule total) by mouth in the morning. 90 capsule 4  . umeclidinium-vilanterol (ANORO ELLIPTA) 62.5-25 MCG/INH AEPB Only open the device one time and take deep breath first thing am 1 each 11  . zolpidem (AMBIEN) 10 MG  tablet TAKE 1 TABLET(10 MG) BY MOUTH AT BEDTIME AS NEEDED 90 tablet 1   No current facility-administered medications for this visit.    OBJECTIVE: White woman who appears younger than stated Vitals:   06/06/20 1509  BP: 132/67  Pulse: 93  Resp: 19  Temp: 97.7 F (36.5 C)  SpO2: 100%     Body mass index is 27.42 kg/m.     Sclerae unicteric, EOMs intact Wearing a mask No cervical or supraclavicular adenopathy Lungs no rales or rhonchi Heart regular rate and rhythm Abd soft, nontender, positive bowel sounds MSK no focal spinal tenderness, no upper extremity lymphedema Neuro: nonfocal, well oriented, appropriate affect Breasts: The right breast is status postlumpectomy and radiation.  There is no evidence of local recurrence.  The left breast is status post lumpectomy.  There is no evidence of local recurrence.  Both axillae are benign.   LAB RESULTS: Lab Results  Component Value Date   WBC 3.1 (L) 06/06/2020   NEUTROABS 2.2 06/06/2020   HGB 10.3 (L) 06/06/2020   HCT 29.5 (L) 06/06/2020   MCV 102.1 (H) 06/06/2020   PLT 163 06/06/2020      Chemistry      Component Value Date/Time   NA 139 06/06/2020 1448   NA 140 12/09/2015 1409   K 3.9 06/06/2020 1448   K 4.2 12/09/2015 1409   CL 109 06/06/2020 1448   CL 107 02/02/2012 0939   CO2 20 (L) 06/06/2020 1448   CO2 25 12/09/2015 1409   BUN 24 (H) 06/06/2020 1448   BUN 16.7 12/09/2015 1409   CREATININE 1.22 (H) 06/06/2020 1448   CREATININE 1.02 (H) 05/09/2019 1303   CREATININE 0.8 12/09/2015 1409      Component Value Date/Time   CALCIUM 8.9 06/06/2020 1448   CALCIUM 9.4 12/09/2015 1409   ALKPHOS 57 06/06/2020 1448   ALKPHOS 54 12/09/2015 1409   AST 21 06/06/2020 1448   AST 23 05/09/2019 1303   AST 20 12/09/2015 1409   ALT 11 06/06/2020 1448   ALT 13 05/09/2019 1303   ALT 13 12/09/2015 1409   BILITOT 0.3 06/06/2020 1448   BILITOT 0.4 05/09/2019 1303   BILITOT <0.22 12/09/2015 1409       Lab Results   Component Value Date   LABCA2 56 (H) 12/10/2010    No components found for: [id number]  No results for input(s): INR in the last 168 hours.  Urinalysis    Component Value Date/Time   COLORURINE YELLOW 07/13/2018 0938   APPEARANCEUR Sl Cloudy (A) 07/13/2018 0938   LABSPEC 1.020 07/13/2018 0938   PHURINE 6.0 07/13/2018 0938   GLUCOSEU NEGATIVE 07/13/2018 0938   HGBUR NEGATIVE 07/13/2018 0938   BILIRUBINUR NEGATIVE 07/13/2018 0938   KETONESUR NEGATIVE 07/13/2018 0938   UROBILINOGEN 0.2 07/13/2018 0938   NITRITE NEGATIVE 07/13/2018 0938   LEUKOCYTESUR TRACE (A) 07/13/2018 0938    STUDIES: CT Chest W Contrast  Result Date: 05/31/2020 CLINICAL DATA:  Follow-up metastatic breast carcinoma. Undergoing chemotherapy. EXAM: CT CHEST WITH CONTRAST TECHNIQUE: Multidetector CT imaging of the chest was performed during intravenous contrast administration. CONTRAST:  55m OMNIPAQUE IOHEXOL 300 MG/ML  SOLN COMPARISON:  11/09/2019 FINDINGS: Cardiovascular: No acute findings. Aortic and coronary atherosclerotic calcification noted. Mediastinum/Nodes: Soft tissue mass in the left anterior chest wall encasing the axillary vessels has decreased in size, currently measuring 4.9 x 2.3 cm on image 31/2, compared to 5.5 x 3.6 cm previously. Lymph node in the AP windows also decreased in size, currently measuring 7 mm on image 49/2 compared to 13 mm previously. No other soft tissue masses or lymphadenopathy identified. Postop lymphocele with adjacent surgical clips in the right breast remains stable. Moderate to large hiatal hernia is again seen. Lungs/Pleura: Mild emphysema again noted. Stable post radiation changes in right upper lobe. A few scattered sub-cm right lung nodules show no significant change. No suspicious pulmonary nodules or masses identified. Upper Abdomen:  Stable hepatic steatosis and small cysts. Musculoskeletal:  No suspicious bone lesions. IMPRESSION: Mild decrease in size of left chest  wall/axillary mass and mediastinal lymphadenopathy. No new or progressive metastatic disease within the thorax. Moderate to large hiatal hernia. Aortic Atherosclerosis (ICD10-I70.0) and Emphysema (ICD10-J43.9). Electronically Signed   By: Marlaine Hind M.D.   On: 05/31/2020 09:20      ASSESSMENT: 80 y.o. [place] woman:  LEFT BREAST #1  S/P LEFT breast lumpectomy with re-excision on 11/29/95 for a T2 N1bi stage IIb invasive ductal carcinoma , grade 2, estrogen receptor 98% positive, progesterone receptor 97% positive, Ki67 8%.  #2 status post 4 cycles of doxorubicin and cyclophosphamide,    (i) followed by radiation therapy under the care of Dr. [name].  #3 received tamoxifen for a total of seven years   RIGHT BREAST #4  S/P biopsy of the RIGHT breast upper inner quadrant on 11/14/10 showing invasive ductal carcinoma,, grade 2, estrogen receptor 85% and progesterone receptor 57% positive, Ki67 20%, HER2 not amplified.    #5 started neoadjuvant letrozole in November 2012; switched to tamoxifen as of February 2016 due to osteopenia concerns  #6  S/P right lumpectomy with sentinel lymph node biopsy on 09/03/11 for a ypT2, ypN1a, stage IIB invasive lobular carcinoma, grade 2,estrogen receptor 97% positive, progesterone receptor 12% positive, with no HER-2 amplification.  #7 status post right breast radiation therapy under the care of Dr. [name] from 10/19/2011 to 12/03/2011.   #8 did not meet criteria for genetic testing according to her insurance company.  #9 osteopenia, with a T score of -1.8 on DEXA scan at [facility] 03/07/2013  (a) status post multiple dental extractions and implants  (b) repeat [facility] density [facility] 04/02/2015 shows a T score of -2.0  METASTATIC DISEASE: April 2021 #10:  left upper extremity lymphedema led to chest CT scan 05/09/2019 showing a 5.1 cm left subpectoral chest wall mass and thoracic lymphadenopathy   (a) CT biopsy of the chest wall mass 05/25/2019  confirms recurrent breast cancer, strongly estrogen and progesterone receptor positive, HER-2 not amplified  (b) PET scan 05/30/2019 shows a left subpectoral mass measuring 5.4 cm, with significant regional and mediastinal adenopathy, sclerotic left scapular metastasis, but no liver or lung involvement.  (c) CA 27-29 is informative  #11 anastrozole started 06/02/2019; palbociclib added 06/08/2019  (a) palbociclib taken irregularly for several months secondary to cytopenias  (b), to start palbociclib 100 mg daily 21 on 7 off October 2021  (c) CT of the chest with contrast 11/09/2019 shows mild disease progression (despite a continuing drop in the CA 27-29)  (d) fulvestrant added 11/23/2019  #12 denosumab/Xgeva starting 06/08/2019, to be repeated every 3 months due to hypocalcemia  #13 genetics testing 06/15/2019 through  the [facility] Common Hereditary Cancers Panel found no deleterious mutations in APC, ATM, AXIN2, BARD1, BMPR1A, BRCA1, BRCA2, BRIP1, CDH1, CDKN2A (p14ARF), CDKN2A (p16INK4a), CKD4, CHEK2, CTNNA1, DICER1, EPCAM (Deletion/duplication testing only), GREM1 (promoter region deletion/duplication testing only), KIT, MEN1, MLH1, MSH2, MSH3, MSH6, MUTYH, NBN, NF1, NHTL1, PALB2, PDGFRA, PMS2, POLD1, POLE, PTEN, RAD50, RAD51C, RAD51D, RNF43, SDHB, SDHC, SDHD, SMAD4, SMARCA4. STK11, TP53, TSC1, TSC2, and VHL.  The following genes were evaluated for sequence changes only: SDHA and HOXB13 c.251G>A variant only.   PLAN:   Kirsti is now a year out from definitive diagnosis of metastatic breast cancer.  She has no symptoms related to her disease.  She is tolerating treatment remarkably well.  We have just restage her with a CT of the abdomen which shows evidence of response.  Her CA 27-29 also has normalized.  Accordingly we are continuing treatment as before.  We are following her counts every month when she returns for her fulvestrant and of course she also receives denosumab/Xgeva every 12 weeks.  She  knows to take additional Tums for calcium supplementation the day of Xgeva and the next day as well.  So far she has not had problems with low calcium.  She has had a very low magnesium level and says she felt great after receiving the magnesium infusion.  She is now on oral magnesium supplementation.  This could complicate her bowel movement issues.  I have asked her to take Questran twice a day for 1 week and then give [place] a call and tell us how things are going.  She also has Imodium on hand but she is even more reluctant to take that at present  Otherwise we will see her again in 12 weeks with her next denosumab injection.  She knows to call for any other issue that may develop before then  Oral encounter time 30 minutes.[name] C. Myrella Fahs, MD 06/06/20 5:39 PM Medical Oncology and Hematology [facility] [place], [place] [address] Tel. [phone]    Fax. [phone]   I, Wilburn Mylar, am acting as scribe for Dr. Virgie Dad. Cristel Rail.  I, Lurline Del MD, have reviewed the above documentation for accuracy and completeness, and I agree with the above.    *Total Encounter Time as defined by the Centers for Medicare and Medicaid Services includes, in addition to the face-to-face time of a patient visit (documented in the note above) non-face-to-face time: obtaining and reviewing outside history, ordering and reviewing medications, tests or procedures, care coordination (communications with other health care professionals or caregivers) and documentation in the medical record.

## 2020-06-06 ENCOUNTER — Other Ambulatory Visit: Payer: Self-pay

## 2020-06-06 ENCOUNTER — Inpatient Hospital Stay: Payer: Medicare Other

## 2020-06-06 ENCOUNTER — Inpatient Hospital Stay: Payer: Medicare Other | Attending: Adult Health | Admitting: Oncology

## 2020-06-06 VITALS — BP 132/67 | HR 93 | Temp 97.7°F | Resp 19 | Wt 154.8 lb

## 2020-06-06 DIAGNOSIS — Z170 Estrogen receptor positive status [ER+]: Secondary | ICD-10-CM

## 2020-06-06 DIAGNOSIS — Z923 Personal history of irradiation: Secondary | ICD-10-CM | POA: Insufficient documentation

## 2020-06-06 DIAGNOSIS — C7951 Secondary malignant neoplasm of bone: Secondary | ICD-10-CM

## 2020-06-06 DIAGNOSIS — Z79818 Long term (current) use of other agents affecting estrogen receptors and estrogen levels: Secondary | ICD-10-CM | POA: Diagnosis not present

## 2020-06-06 DIAGNOSIS — C50212 Malignant neoplasm of upper-inner quadrant of left female breast: Secondary | ICD-10-CM

## 2020-06-06 DIAGNOSIS — Z79810 Long term (current) use of selective estrogen receptor modulators (SERMs): Secondary | ICD-10-CM | POA: Insufficient documentation

## 2020-06-06 DIAGNOSIS — C50912 Malignant neoplasm of unspecified site of left female breast: Secondary | ICD-10-CM

## 2020-06-06 DIAGNOSIS — M8580 Other specified disorders of bone density and structure, unspecified site: Secondary | ICD-10-CM | POA: Diagnosis not present

## 2020-06-06 DIAGNOSIS — C792 Secondary malignant neoplasm of skin: Secondary | ICD-10-CM | POA: Diagnosis not present

## 2020-06-06 DIAGNOSIS — C50911 Malignant neoplasm of unspecified site of right female breast: Secondary | ICD-10-CM

## 2020-06-06 DIAGNOSIS — R9389 Abnormal findings on diagnostic imaging of other specified body structures: Secondary | ICD-10-CM

## 2020-06-06 DIAGNOSIS — C50211 Malignant neoplasm of upper-inner quadrant of right female breast: Secondary | ICD-10-CM

## 2020-06-06 LAB — CBC WITH DIFFERENTIAL/PLATELET
Abs Immature Granulocytes: 0.03 10*3/uL (ref 0.00–0.07)
Basophils Absolute: 0 10*3/uL (ref 0.0–0.1)
Basophils Relative: 1 %
Eosinophils Absolute: 0 10*3/uL (ref 0.0–0.5)
Eosinophils Relative: 1 %
HCT: 29.5 % — ABNORMAL LOW (ref 36.0–46.0)
Hemoglobin: 10.3 g/dL — ABNORMAL LOW (ref 12.0–15.0)
Immature Granulocytes: 1 %
Lymphocytes Relative: 20 %
Lymphs Abs: 0.6 10*3/uL — ABNORMAL LOW (ref 0.7–4.0)
MCH: 35.6 pg — ABNORMAL HIGH (ref 26.0–34.0)
MCHC: 34.9 g/dL (ref 30.0–36.0)
MCV: 102.1 fL — ABNORMAL HIGH (ref 80.0–100.0)
Monocytes Absolute: 0.2 10*3/uL (ref 0.1–1.0)
Monocytes Relative: 8 %
Neutro Abs: 2.2 10*3/uL (ref 1.7–7.7)
Neutrophils Relative %: 69 %
Platelets: 163 10*3/uL (ref 150–400)
RBC: 2.89 MIL/uL — ABNORMAL LOW (ref 3.87–5.11)
RDW: 13.6 % (ref 11.5–15.5)
WBC: 3.1 10*3/uL — ABNORMAL LOW (ref 4.0–10.5)
nRBC: 0 % (ref 0.0–0.2)

## 2020-06-06 LAB — COMPREHENSIVE METABOLIC PANEL
ALT: 11 U/L (ref 0–44)
AST: 21 U/L (ref 15–41)
Albumin: 3.7 g/dL (ref 3.5–5.0)
Alkaline Phosphatase: 57 U/L (ref 38–126)
Anion gap: 10 (ref 5–15)
BUN: 24 mg/dL — ABNORMAL HIGH (ref 8–23)
CO2: 20 mmol/L — ABNORMAL LOW (ref 22–32)
Calcium: 8.9 mg/dL (ref 8.9–10.3)
Chloride: 109 mmol/L (ref 98–111)
Creatinine, Ser: 1.22 mg/dL — ABNORMAL HIGH (ref 0.44–1.00)
GFR, Estimated: 45 mL/min — ABNORMAL LOW (ref >60)
Glucose, Bld: 102 mg/dL — ABNORMAL HIGH (ref 70–99)
Potassium: 3.9 mmol/L (ref 3.5–5.1)
Sodium: 139 mmol/L (ref 135–145)
Total Bilirubin: 0.3 mg/dL (ref 0.3–1.2)
Total Protein: 7.3 g/dL (ref 6.5–8.1)

## 2020-06-06 LAB — MAGNESIUM: Magnesium: 1 mg/dL — ABNORMAL LOW (ref 1.7–2.4)

## 2020-06-06 MED ORDER — DENOSUMAB 120 MG/1.7ML ~~LOC~~ SOLN
120.0000 mg | Freq: Once | SUBCUTANEOUS | Status: AC
Start: 2020-06-06 — End: 2020-06-06
  Administered 2020-06-06: 120 mg via SUBCUTANEOUS

## 2020-06-06 MED ORDER — FULVESTRANT 250 MG/5ML IM SOLN
INTRAMUSCULAR | Status: AC
  Filled 2020-06-06: qty 10

## 2020-06-06 MED ORDER — DENOSUMAB 120 MG/1.7ML ~~LOC~~ SOLN
SUBCUTANEOUS | Status: AC
  Filled 2020-06-06: qty 1.7

## 2020-06-06 MED ORDER — FULVESTRANT 250 MG/5ML IM SOLN
500.0000 mg | Freq: Once | INTRAMUSCULAR | Status: AC
  Administered 2020-06-06: 500 mg via INTRAMUSCULAR

## 2020-06-07 LAB — CANCER ANTIGEN 27.29: CA 27.29: 43.4 U/mL — ABNORMAL HIGH (ref 0.0–38.6)

## 2020-06-10 ENCOUNTER — Other Ambulatory Visit: Payer: Self-pay | Admitting: Oncology

## 2020-06-10 MED ORDER — MAGNESIUM CHLORIDE 64 MG PO TBEC: 1.0000 | DELAYED_RELEASE_TABLET | Freq: Two times a day (BID) | ORAL | 6 refills | Status: DC

## 2020-06-13 DIAGNOSIS — H353112 Nonexudative age-related macular degeneration, right eye, intermediate dry stage: Secondary | ICD-10-CM | POA: Diagnosis not present

## 2020-06-13 DIAGNOSIS — H353121 Nonexudative age-related macular degeneration, left eye, early dry stage: Secondary | ICD-10-CM | POA: Diagnosis not present

## 2020-06-13 DIAGNOSIS — H40013 Open angle with borderline findings, low risk, bilateral: Secondary | ICD-10-CM | POA: Diagnosis not present

## 2020-06-13 DIAGNOSIS — H524 Presbyopia: Secondary | ICD-10-CM | POA: Diagnosis not present

## 2020-06-13 DIAGNOSIS — H35033 Hypertensive retinopathy, bilateral: Secondary | ICD-10-CM | POA: Diagnosis not present

## 2020-06-18 ENCOUNTER — Telehealth: Payer: Self-pay | Admitting: Internal Medicine

## 2020-06-18 NOTE — Telephone Encounter (Signed)
LVM for pt to rtn my call to schedule AWV with [facility]. Please schedule if pt calls the office.  ?

## 2020-06-19 ENCOUNTER — Encounter: Payer: Self-pay | Admitting: Adult Health

## 2020-06-20 ENCOUNTER — Telehealth: Payer: Self-pay | Admitting: Internal Medicine

## 2020-06-20 NOTE — Telephone Encounter (Signed)
LVM for pt to rtn my call to schedule AWV with [facility]. Please schedule AWV if pt calls the office  

## 2020-06-28 ENCOUNTER — Other Ambulatory Visit: Payer: Self-pay | Admitting: Oncology

## 2020-06-29 ENCOUNTER — Other Ambulatory Visit: Payer: Self-pay | Admitting: Internal Medicine

## 2020-06-29 NOTE — Telephone Encounter (Signed)
Ok to contact pt  ambien refill x 1 mo  Please to make ROV for further refills

## 2020-07-02 ENCOUNTER — Ambulatory Visit: Payer: Medicare Other | Admitting: Internal Medicine

## 2020-07-04 ENCOUNTER — Inpatient Hospital Stay: Payer: Medicare Other

## 2020-07-04 ENCOUNTER — Other Ambulatory Visit: Payer: Self-pay

## 2020-07-04 ENCOUNTER — Inpatient Hospital Stay: Payer: Medicare Other | Attending: Adult Health

## 2020-07-04 VITALS — BP 131/58 | HR 95 | Temp 98.8°F | Resp 20

## 2020-07-04 DIAGNOSIS — Z170 Estrogen receptor positive status [ER+]: Secondary | ICD-10-CM

## 2020-07-04 DIAGNOSIS — C7951 Secondary malignant neoplasm of bone: Secondary | ICD-10-CM

## 2020-07-04 DIAGNOSIS — R9389 Abnormal findings on diagnostic imaging of other specified body structures: Secondary | ICD-10-CM

## 2020-07-04 DIAGNOSIS — C50211 Malignant neoplasm of upper-inner quadrant of right female breast: Secondary | ICD-10-CM | POA: Diagnosis not present

## 2020-07-04 DIAGNOSIS — C50912 Malignant neoplasm of unspecified site of left female breast: Secondary | ICD-10-CM

## 2020-07-04 DIAGNOSIS — Z79818 Long term (current) use of other agents affecting estrogen receptors and estrogen levels: Secondary | ICD-10-CM | POA: Insufficient documentation

## 2020-07-04 DIAGNOSIS — C50911 Malignant neoplasm of unspecified site of right female breast: Secondary | ICD-10-CM

## 2020-07-04 DIAGNOSIS — C50212 Malignant neoplasm of upper-inner quadrant of left female breast: Secondary | ICD-10-CM

## 2020-07-04 LAB — CBC WITH DIFFERENTIAL/PLATELET
Abs Immature Granulocytes: 0.01 10*3/uL (ref 0.00–0.07)
Basophils Absolute: 0 10*3/uL (ref 0.0–0.1)
Basophils Relative: 1 %
Eosinophils Absolute: 0.1 10*3/uL (ref 0.0–0.5)
Eosinophils Relative: 2 %
HCT: 28.4 % — ABNORMAL LOW (ref 36.0–46.0)
Hemoglobin: 9.7 g/dL — ABNORMAL LOW (ref 12.0–15.0)
Immature Granulocytes: 0 %
Lymphocytes Relative: 13 %
Lymphs Abs: 0.4 10*3/uL — ABNORMAL LOW (ref 0.7–4.0)
MCH: 35.4 pg — ABNORMAL HIGH (ref 26.0–34.0)
MCHC: 34.2 g/dL (ref 30.0–36.0)
MCV: 103.6 fL — ABNORMAL HIGH (ref 80.0–100.0)
Monocytes Absolute: 0.3 10*3/uL (ref 0.1–1.0)
Monocytes Relative: 10 %
Neutro Abs: 2.3 10*3/uL (ref 1.7–7.7)
Neutrophils Relative %: 74 %
Platelets: 213 10*3/uL (ref 150–400)
RBC: 2.74 MIL/uL — ABNORMAL LOW (ref 3.87–5.11)
RDW: 13.8 % (ref 11.5–15.5)
WBC: 3.2 10*3/uL — ABNORMAL LOW (ref 4.0–10.5)
nRBC: 0 % (ref 0.0–0.2)

## 2020-07-04 LAB — COMPREHENSIVE METABOLIC PANEL
ALT: 11 U/L (ref 0–44)
AST: 20 U/L (ref 15–41)
Albumin: 3.4 g/dL — ABNORMAL LOW (ref 3.5–5.0)
Alkaline Phosphatase: 74 U/L (ref 38–126)
Anion gap: 15 (ref 5–15)
BUN: 24 mg/dL — ABNORMAL HIGH (ref 8–23)
CO2: 18 mmol/L — ABNORMAL LOW (ref 22–32)
Calcium: 9.5 mg/dL (ref 8.9–10.3)
Chloride: 105 mmol/L (ref 98–111)
Creatinine, Ser: 1.38 mg/dL — ABNORMAL HIGH (ref 0.44–1.00)
GFR, Estimated: 39 mL/min — ABNORMAL LOW (ref >60)
Glucose, Bld: 109 mg/dL — ABNORMAL HIGH (ref 70–99)
Potassium: 4.4 mmol/L (ref 3.5–5.1)
Sodium: 138 mmol/L (ref 135–145)
Total Bilirubin: 0.4 mg/dL (ref 0.3–1.2)
Total Protein: 7.5 g/dL (ref 6.5–8.1)

## 2020-07-04 LAB — MAGNESIUM: Magnesium: 1.3 mg/dL — ABNORMAL LOW (ref 1.7–2.4)

## 2020-07-04 MED ORDER — FULVESTRANT 250 MG/5ML IM SOLN
INTRAMUSCULAR | Status: AC
  Filled 2020-07-04: qty 10

## 2020-07-04 MED ORDER — FULVESTRANT 250 MG/5ML IM SOLN
500.0000 mg | Freq: Once | INTRAMUSCULAR | Status: AC
Start: 2020-07-04 — End: 2020-07-04
  Administered 2020-07-04: 500 mg via INTRAMUSCULAR

## 2020-07-04 NOTE — Patient Instructions (Signed)
Fulvestrant injection What is this medicine? FULVESTRANT (ful VES trant) blocks the effects of estrogen. It is used to treat breast cancer. This medicine may be used for other purposes; ask your health care provider or pharmacist if you have questions. COMMON BRAND NAME(S): FASLODEX What should I tell my health care provider before I take this medicine? They need to know if you have any of these conditions:  bleeding disorders  liver disease  low blood counts, like low white cell, platelet, or red cell counts  an unusual or allergic reaction to fulvestrant, other medicines, foods, dyes, or preservatives  pregnant or trying to get pregnant  breast-feeding How should I use this medicine? This medicine is for injection into a muscle. It is usually given by a health care professional in a hospital or clinic setting. Talk to your pediatrician regarding the use of this medicine in children. Special care may be needed. Overdosage: If you think you have taken too much of this medicine contact a poison control center or emergency room at once. NOTE: This medicine is only for you. Do not share this medicine with others. What if I miss a dose? It is important not to miss your dose. Call your doctor or health care professional if you are unable to keep an appointment. What may interact with this medicine?  medicines that treat or prevent blood clots like warfarin, enoxaparin, dalteparin, apixaban, dabigatran, and rivaroxaban This list may not describe all possible interactions. Give your health care provider a list of all the medicines, herbs, non-prescription drugs, or dietary supplements you use. Also tell them if you smoke, drink alcohol, or use illegal drugs. Some items may interact with your medicine. What should I watch for while using this medicine? Your condition will be monitored carefully while you are receiving this medicine. You will need important blood work done while you are taking  this medicine. Do not become pregnant while taking this medicine or for at least 1 year after stopping it. Women of child-bearing potential will need to have a negative pregnancy test before starting this medicine. Women should inform their doctor if they wish to become pregnant or think they might be pregnant. There is a potential for serious side effects to an unborn child. Men should inform their doctors if they wish to father a child. This medicine may lower sperm counts. Talk to your health care professional or pharmacist for more information. Do not breast-feed an infant while taking this medicine or for 1 year after the last dose. What side effects may I notice from receiving this medicine? Side effects that you should report to your doctor or health care professional as soon as possible:  allergic reactions like skin rash, itching or hives, swelling of the face, lips, or tongue  feeling faint or lightheaded, falls  pain, tingling, numbness, or weakness in the legs  signs and symptoms of infection like fever or chills; cough; flu-like symptoms; sore throat  vaginal bleeding Side effects that usually do not require medical attention (report to your doctor or health care professional if they continue or are bothersome):  aches, pains  constipation  diarrhea  headache  hot flashes  nausea, vomiting  pain at site where injected  stomach pain This list may not describe all possible side effects. Call your doctor for medical advice about side effects. You may report side effects to FDA at 1-800-FDA-1088. Where should I keep my medicine? This drug is given in a hospital or clinic and will   not be stored at home. NOTE: This sheet is a summary. It may not cover all possible information. If you have questions about this medicine, talk to your doctor, pharmacist, or health care provider.  2021 Elsevier/Gold Standard (2017-04-29 11:34:41)  

## 2020-07-05 LAB — CANCER ANTIGEN 27.29: CA 27.29: 51.3 U/mL — ABNORMAL HIGH (ref 0.0–38.6)

## 2020-07-06 ENCOUNTER — Other Ambulatory Visit: Payer: Self-pay

## 2020-07-08 ENCOUNTER — Ambulatory Visit (INDEPENDENT_AMBULATORY_CARE_PROVIDER_SITE_OTHER): Payer: Medicare Other | Admitting: Internal Medicine

## 2020-07-08 ENCOUNTER — Encounter: Payer: Self-pay | Admitting: Internal Medicine

## 2020-07-08 ENCOUNTER — Ambulatory Visit (INDEPENDENT_AMBULATORY_CARE_PROVIDER_SITE_OTHER): Payer: Medicare Other

## 2020-07-08 ENCOUNTER — Other Ambulatory Visit: Payer: Self-pay

## 2020-07-08 VITALS — BP 110/80 | HR 90 | Temp 98.0°F | Ht 63.0 in | Wt 149.2 lb

## 2020-07-08 DIAGNOSIS — I10 Essential (primary) hypertension: Secondary | ICD-10-CM | POA: Diagnosis not present

## 2020-07-08 DIAGNOSIS — Z0000 Encounter for general adult medical examination without abnormal findings: Secondary | ICD-10-CM

## 2020-07-08 DIAGNOSIS — J302 Other seasonal allergic rhinitis: Secondary | ICD-10-CM

## 2020-07-08 DIAGNOSIS — R739 Hyperglycemia, unspecified: Secondary | ICD-10-CM | POA: Diagnosis not present

## 2020-07-08 DIAGNOSIS — J309 Allergic rhinitis, unspecified: Secondary | ICD-10-CM

## 2020-07-08 MED ORDER — METHYLPREDNISOLONE ACETATE 80 MG/ML IJ SUSP
80.0000 mg | Freq: Once | INTRAMUSCULAR | Status: AC
  Administered 2020-07-08: 80 mg via INTRAMUSCULAR

## 2020-07-08 MED ORDER — HYDROCODONE BIT-HOMATROP MBR 5-1.5 MG/5ML PO SOLN: 5.0000 mL | Freq: Four times a day (QID) | ORAL | 0 refills | Status: AC | PRN

## 2020-07-08 NOTE — Assessment & Plan Note (Addendum)
Lab Results  Component Value Date   HGBA1C 6.6 (H) 07/13/2018   Stable, pt to continue current medical treatment  - diet

## 2020-07-08 NOTE — Progress Notes (Signed)
Patient ID: Stacey [name], female   DOB: 1940/12/16, 80 y.o.   MRN: [id number]         Chief Complaint:: general exam       HPI:  Stacey [name] is a 80 y.o. female here with above, has greenish tint to right 4th fingernail after applied nail removed recently.  Also with c/o several months ongoing left upper back/post lat neck pain localized without radiation, severe when occurs but relatively infrequent, hard to say what seems to cause, only occurs during the day not at night. Better with massage of the area but o/w nothing else makes better or worse.  Does have several wks ongoing nasal allergy symptoms with clearish congestion, itch and sneezing, without fever, pain, ST, swelling or wheezing but has cough with greenish colorized tint, hard to sleep at night.   Pt denies fever.  Cont's to follow closely with oncology with metastatic ca.    Wt Readings from Last 3 Encounters:  07/08/20 149 lb 3.2 oz (67.7 kg)  07/08/20 149 lb 3.2 oz (67.7 kg)  06/06/20 154 lb 12.8 oz (70.2 kg)   BP Readings from Last 3 Encounters:  07/08/20 110/80  07/08/20 110/80  07/04/20 (!) 131/58   Immunization History  Administered Date(s) Administered  . Fluad Quad(high Dose 65+) 11/03/2018  . Influenza Split 12/17/2011  . Influenza Whole 12/17/2006, 10/23/2008, 10/18/2010  . Influenza, High Dose Seasonal PF 11/16/2013, 11/26/2017  . Influenza-Unspecified 11/02/2012, 11/17/2014, 11/05/2015, 11/03/2019  . PFIZER(Purple Top)SARS-COV-2 Vaccination 02/27/2019, 03/27/2019, 09/20/2019  . Pneumococcal Conjugate-13 12/15/2013  . Pneumococcal Polysaccharide-23 01/20/2006  . Td 06/14/2008  . Tdap 02/10/2019   There are no preventive care reminders to display for this patient.    Past Medical History:  Diagnosis Date  . ANEMIA-NOS   . ANXIETY   . Breast cancer ([place]) 1997 L, 2012 R   s/p chemo/xrt  . COPD    resolved  . DIVERTICULOSIS, COLON 2008  . Dizziness   . Family history of breast cancer   . Family history  of lung cancer   . Family history of lymphoma   . Family history of pancreatic cancer   . Family history of uterine cancer   . GERD   . Hx of radiation therapy 10/19/11 -12/03/11   right breast  . HYPERLIPIDEMIA   . IRRITABLE BOWEL SYNDROME, HX OF   . Left-sided carotid artery disease (HCC)    moderate left ICA stenosis  . OSTEOARTHRITIS, HAND   . PSVT (paroxysmal supraventricular tachycardia) (HCC)    symptomatic on event monitor   Past Surgical History:  Procedure Laterality Date  . ABDOMINAL HYSTERECTOMY    . APPENDECTOMY    . BREAST BIOPSY  11/14/10    r breast: inv, insitu mammary carcinoma w/calcif, er/pr +, her2 -  . BREAST SURGERY     lumpectomy  . CATARACT EXTRACTION     both eyes  . ELECTROPHYSIOLOGIC STUDY N/A 05/10/2015   Procedure: SVT Ablation;  Surgeon: Will Meredith Leeds, MD;  Location: [facility];  Service: Cardiovascular;  Laterality: N/A;  . HERNIA REPAIR    . inguinal herniorrhapy  1984   left  . IR US GUIDE BX ASP/DRAIN  05/26/2019  . rectal fissure repair    . s/p benign breast biopsy  2003   right  . s/p left foot surgury  2009  . 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 spiral fx left foot  2008   no  surgury  . TMJ ARTHROPLASTY  1989  . [place]- to remove scar tissue growth   . TONSILLECTOMY      reports that she quit smoking about 4 years ago. Her smoking use included cigarettes. She has a 54.00 pack-year smoking history. She has never used smokeless tobacco. She reports current alcohol use. She reports that she does not use drugs. family history includes Brain cancer in her cousin; Breast cancer in her cousin, cousin, and cousin; Colon polyps in her mother; Diabetes in her mother; Hypertension in her mother; Lung cancer in her maternal grandfather and paternal uncle; Lung cancer (age of onset: 2) in her maternal grandmother; Lymphoma in her brother; Pancreatic cancer (age of onset: 56) in her mother; Stroke in her  mother; Testicular cancer in her cousin; Uterine cancer (age of onset: 47) in her mother. Allergies  Allergen Reactions  . Bee Venom Swelling  . Clindamycin/Lincomycin Diarrhea and Nausea And Vomiting  . Codeine Nausea And Vomiting   Current Outpatient Medications on File Prior to Visit  Medication Sig Dispense Refill  . anastrozole (ARIMIDEX) 1 MG tablet TAKE 1 TABLET(1 MG) BY MOUTH DAILY 90 tablet 4  . aspirin 81 MG EC tablet Take 81 mg by mouth daily.    . calcium-vitamin D (OSCAL WITH D) 500-200 MG-UNIT per tablet Take 1 tablet by mouth daily.    . cholecalciferol (VITAMIN D) 1000 UNITS tablet Take 1,000 Units by mouth daily.    . cholestyramine (QUESTRAN) 4 g packet Take 1 packet (4 g total) by mouth 3 (three) times daily. 60 each 12  . clotrimazole-betamethasone (LOTRISONE) cream Apply 1 application topically 2 (two) times daily as needed. 30 g 1  . diclofenac sodium (VOLTAREN) 1 % GEL Apply 4 g topically 4 (four) times daily as needed. 400 g 11  . diphenhydrAMINE (BENADRYL) 25 MG tablet Take 25 mg by mouth every 6 (six) hours as needed (for bee stings).     . fluticasone (FLONASE) 50 MCG/ACT nasal spray Place 2 sprays into both nostrils daily.    . fulvestrant (FASLODEX) 250 MG/5ML injection Inject 250 mg into the muscle once. One injection each buttock over 1-2 minutes. Warm prior to use.    . loratadine (CLARITIN) 10 MG tablet Take 10 mg by mouth daily.    . Multiple Vitamin (MULTIVITAMIN) capsule Take 1 capsule by mouth daily.    Marland Kitchen omeprazole (PRILOSEC) 20 MG capsule TAKE 1 CAPSULE(20 MG) BY MOUTH DAILY 90 capsule 1  . palbociclib (IBRANCE) 100 MG tablet Take 1 tablet (100 mg total) by mouth Monday through Friday for 3 weeks, then one week off, then repeat 15 tablet 6  . polyethylene glycol (MIRALAX / GLYCOLAX) packet take 1 packet once daily if needed for constipation 30 packet 11  . prochlorperazine (COMPAZINE) 10 MG tablet Take 1 tablet (10 mg total) by mouth every 6 (six) hours  as needed for nausea or vomiting. 30 tablet 1  . pseudoephedrine (SUDAFED) 30 MG tablet Take 30 mg by mouth every 4 (four) hours as needed for congestion.     Marland Kitchen telmisartan (MICARDIS) 40 MG tablet TAKE 1 TABLET(40 MG) BY MOUTH DAILY 90 tablet 1  . Tiotropium Bromide Monohydrate (SPIRIVA RESPIMAT) 2.5 MCG/ACT AERS Inhale 2 puffs into the lungs daily. 4 g 11  . triamterene-hydrochlorothiazide (DYAZIDE) 37.5-25 MG capsule Take 1 each (1 capsule total) by mouth in the morning. 90 capsule 4  . zolpidem (AMBIEN) 10 MG tablet TAKE 1 TABLET(10  MG) BY MOUTH AT BEDTIME AS NEEDED 30 tablet 0   No current facility-administered medications on file prior to visit.        ROS:  All others reviewed and negative.  Objective        PE:  BP 110/80 (BP Location: Left Arm, Patient Position: Sitting, Cuff Size: Normal)   Pulse 90   Temp 98 F (36.7 C) (Oral)   Ht '5\' 3"'  (1.6 m)   Wt 149 lb 3.2 oz (67.7 kg)   SpO2 97%   BMI 26.43 kg/m                 Constitutional: Pt appears in NAD               HENT: Head: NCAT.                Right Ear: External ear normal.                 Left Ear: External ear normal.                Eyes: . Pupils are equal, round, and reactive to light. Conjunctivae and EOM are normal               Nose: without d/c or deformity               Neck: Neck supple. Gross normal ROM               Cardiovascular: Normal rate and regular rhythm.                 Pulmonary/Chest: Effort normal and breath sounds without rales or wheezing.                Left upper back without swelling or rash               Neurological: Pt is alert. At baseline orientation, motor grossly intact               Skin: Skin is warm. No rashes, no other new lesions, LE edema - none               Psychiatric: Pt behavior is normal without agitation   Micro: none  Cardiac tracings I have personally interpreted today:  none  Pertinent Radiological findings (summarize): none   Lab Results  Component Value  Date   WBC 3.2 (L) 07/04/2020   HGB 9.7 (L) 07/04/2020   HCT 28.4 (L) 07/04/2020   PLT 213 07/04/2020   GLUCOSE 109 (H) 07/04/2020   CHOL 193 07/13/2018   TRIG 231.0 (H) 07/13/2018   HDL 49.40 07/13/2018   LDLDIRECT 119.0 07/13/2018   LDLCALC 84 07/04/2009   ALT 11 07/04/2020   AST 20 07/04/2020   NA 138 07/04/2020   K 4.4 07/04/2020   CL 105 07/04/2020   CREATININE 1.38 (H) 07/04/2020   BUN 24 (H) 07/04/2020   CO2 18 (L) 07/04/2020   TSH 3.41 07/13/2018   INR 1.1 05/26/2019   HGBA1C 6.6 (H) 07/13/2018   Assessment/Plan:  KHORI UNDERBERG is a 80 y.o. White or Caucasian [1] female with  has a past medical history of ANEMIA-NOS, ANXIETY, Breast cancer ([place]) (1997 L, 2012 R), COPD, DIVERTICULOSIS, COLON (2008), Dizziness, Family history of breast cancer, Family history of lung cancer, Family history of lymphoma, Family history of pancreatic cancer, Family history of uterine cancer, GERD, radiation therapy (10/19/11 -12/03/11), HYPERLIPIDEMIA, IRRITABLE BOWEL SYNDROME, HX OF,  Left-sided carotid artery disease ([place]), OSTEOARTHRITIS, HAND, and PSVT (paroxysmal supraventricular tachycardia) ([place]).  Allergic rhinitis With seasonal flare, for depomedrol im 80, cough med prn,  to f/u any worsening symptoms or concerns  HTN (hypertension) BP Readings from Last 3 Encounters:  07/08/20 110/80  07/08/20 110/80  07/04/20 (!) 131/58   Stable, pt to continue medical treatment micardis, dyazide   Hyperglycemia Lab Results  Component Value Date   HGBA1C 6.6 (H) 07/13/2018   Stable, pt to continue current medical treatment  - diet  Followup: Return in about 6 months (around 01/07/2021).  Cathlean Cower, MD 07/08/2020 9:40 PM [facility]

## 2020-07-08 NOTE — Patient Instructions (Signed)
You had the steroid shot today  Please take all new medication as prescribed - the cough medicine as needed  Please continue all other medications as before, and refills have been done if requested.  Please have the pharmacy call with any other refills you may need.  Please keep your appointments with your specialists as you may have planned  Please make an Appointment to return in 6 months, or sooner if needed

## 2020-07-08 NOTE — Patient Instructions (Signed)
[name] , Thank you for taking time to come for your Medicare Wellness Visit. I appreciate your ongoing commitment to your health goals. Please review the following plan we discussed and let me know if I can assist you in the future.   Screening recommendations/referrals: Colonoscopy: not a candidate for colon cancer screening due to age 80: 12/27/2018 Bone Density: 04/02/2015 Recommended yearly ophthalmology/optometry visit for glaucoma screening and checkup Recommended yearly dental visit for hygiene and checkup  Vaccinations: Influenza vaccine: 11/14/2019 Pneumococcal vaccine: 01/20/2006, 12/15/2013 Tdap vaccine: 02/10/2019; due every 10 years Shingles vaccine: never done; can check with local pharmacy.   Covid-19: 02/27/2019, 03/27/2019, 09/20/2019  Advanced directives: Please bring a copy of your health care power of attorney and living will to the office at your convenience.  Conditions/risks identified: Yes; Reviewed health maintenance screenings with patient today and relevant education, vaccines, and/or referrals were provided. Please continue to do your personal lifestyle choices by: daily care of teeth and gums, regular physical activity (goal should be 5 days a week for 30 minutes), eat a healthy diet, avoid tobacco and drug use, limiting any alcohol intake, taking a low-dose aspirin (if not allergic or have been advised by your provider otherwise) and taking vitamins and minerals as recommended by your provider. Continue doing brain stimulating activities (puzzles, reading, adult coloring books, staying active) to keep memory sharp. Continue to eat heart healthy diet (full of fruits, vegetables, whole grains, lean protein, water--limit salt, fat, and sugar intake) and increase physical activity as tolerated.  Next appointment: Please schedule your next Medicare Wellness Visit with your Nurse Health Advisor in 1 year by calling [phone].   Preventive Care 51 Years and  Older, Female Preventive care refers to lifestyle choices and visits with your health care provider that can promote health and wellness. What does preventive care include?  A yearly physical exam. This is also called an annual well check.  Dental exams once or twice a year.  Routine eye exams. Ask your health care provider how often you should have your eyes checked.  Personal lifestyle choices, including:  Daily care of your teeth and gums.  Regular physical activity.  Eating a healthy diet.  Avoiding tobacco and drug use.  Limiting alcohol use.  Practicing safe sex.  Taking low-dose aspirin every day.  Taking vitamin and mineral supplements as recommended by your health care provider. What happens during an annual well check? The services and screenings done by your health care provider during your annual well check will depend on your age, overall health, lifestyle risk factors, and family history of disease. Counseling  Your health care provider may ask you questions about your:  Alcohol use.  Tobacco use.  Drug use.  Emotional well-being.  Home and relationship well-being.  Sexual activity.  Eating habits.  History of falls.  Memory and ability to understand (cognition).  Work and work Statistician.  Reproductive health. Screening  You may have the following tests or measurements:  Height, weight, and BMI.  Blood pressure.  Lipid and cholesterol levels. These may be checked every 5 years, or more frequently if you are over 49 years old.  Skin check.  Lung cancer screening. You may have this screening every year starting at age 39 if you have a 30-pack-year history of smoking and currently smoke or have quit within the past 15 years.  Fecal occult blood test (FOBT) of the stool. You may have this test every year starting at age 83.  Flexible sigmoidoscopy  or colonoscopy. You may have a sigmoidoscopy every 5 years or a colonoscopy every 10 years  starting at age 48.  Hepatitis C blood test.  Hepatitis B blood test.  Sexually transmitted disease (STD) testing.  Diabetes screening. This is done by checking your blood sugar (glucose) after you have not eaten for a while (fasting). You may have this done every 1-3 years.  Bone density scan. This is done to screen for osteoporosis. You may have this done starting at age 72.  Mammogram. This may be done every 1-2 years. Talk to your health care provider about how often you should have regular mammograms. Talk with your health care provider about your test results, treatment options, and if necessary, the need for more tests. Vaccines  Your health care provider may recommend certain vaccines, such as:  Influenza vaccine. This is recommended every year.  Tetanus, diphtheria, and acellular pertussis (Tdap, Td) vaccine. You may need a Td booster every 10 years.  Zoster vaccine. You may need this after age 35.  Pneumococcal 13-valent conjugate (PCV13) vaccine. One dose is recommended after age 78.  Pneumococcal polysaccharide (PPSV23) vaccine. One dose is recommended after age 3. Talk to your health care provider about which screenings and vaccines you need and how often you need them. This information is not intended to replace advice given to you by your health care provider. Make sure you discuss any questions you have with your health care provider. Document Released: 02/15/2015 Document Revised: 10/09/2015 Document Reviewed: 11/20/2014 Elsevier Interactive Patient Education  2017 [employer] Prevention in the Home Falls can cause injuries. They can happen to people of all ages. There are many things you can do to make your home safe and to help prevent falls. What can I do on the outside of my home?  Regularly fix the edges of walkways and driveways and fix any cracks.  Remove anything that might make you trip as you walk through a door, such as a raised step or  threshold.  Trim any bushes or trees on the path to your home.  Use bright outdoor lighting.  Clear any walking paths of anything that might make someone trip, such as rocks or tools.  Regularly check to see if handrails are loose or broken. Make sure that both sides of any steps have handrails.  Any raised decks and porches should have guardrails on the edges.  Have any leaves, snow, or ice cleared regularly.  Use sand or salt on walking paths during winter.  Clean up any spills in your garage right away. This includes oil or grease spills. What can I do in the bathroom?  Use night lights.  Install grab bars by the toilet and in the tub and shower. Do not use towel bars as grab bars.  Use non-skid mats or decals in the tub or shower.  If you need to sit down in the shower, use a plastic, non-slip stool.  Keep the floor dry. Clean up any water that spills on the floor as soon as it happens.  Remove soap buildup in the tub or shower regularly.  Attach bath mats securely with double-sided non-slip rug tape.  Do not have throw rugs and other things on the floor that can make you trip. What can I do in the bedroom?  Use night lights.  Make sure that you have a light by your bed that is easy to reach.  Do not use any sheets or blankets that are  too big for your bed. They should not hang down onto the floor.  Have a firm chair that has side arms. You can use this for support while you get dressed.  Do not have throw rugs and other things on the floor that can make you trip. What can I do in the kitchen?  Clean up any spills right away.  Avoid walking on wet floors.  Keep items that you use a lot in easy-to-reach places.  If you need to reach something above you, use a strong step stool that has a grab bar.  Keep electrical cords out of the way.  Do not use floor polish or wax that makes floors slippery. If you must use wax, use non-skid floor wax.  Do not have  throw rugs and other things on the floor that can make you trip. What can I do with my stairs?  Do not leave any items on the stairs.  Make sure that there are handrails on both sides of the stairs and use them. Fix handrails that are broken or loose. Make sure that handrails are as long as the stairways.  Check any carpeting to make sure that it is firmly attached to the stairs. Fix any carpet that is loose or worn.  Avoid having throw rugs at the top or bottom of the stairs. If you do have throw rugs, attach them to the floor with carpet tape.  Make sure that you have a light switch at the top of the stairs and the bottom of the stairs. If you do not have them, ask someone to add them for you. What else can I do to help prevent falls?  Wear shoes that:  Do not have high heels.  Have rubber bottoms.  Are comfortable and fit you well.  Are closed at the toe. Do not wear sandals.  If you use a stepladder:  Make sure that it is fully opened. Do not climb a closed stepladder.  Make sure that both sides of the stepladder are locked into place.  Ask someone to hold it for you, if possible.  Clearly mark and make sure that you can see:  Any grab bars or handrails.  First and last steps.  Where the edge of each step is.  Use tools that help you move around (mobility aids) if they are needed. These include:  Canes.  Walkers.  Scooters.  Crutches.  Turn on the lights when you go into a dark area. Replace any light bulbs as soon as they burn out.  Set up your furniture so you have a clear path. Avoid moving your furniture around.  If any of your floors are uneven, fix them.  If there are any pets around you, be aware of where they are.  Review your medicines with your doctor. Some medicines can make you feel dizzy. This can increase your chance of falling. Ask your doctor what other things that you can do to help prevent falls. This information is not intended to  replace advice given to you by your health care provider. Make sure you discuss any questions you have with your health care provider. Document Released: 11/15/2008 Document Revised: 06/27/2015 Document Reviewed: 02/23/2014 Elsevier Interactive Patient Education  2017 [employer].

## 2020-07-08 NOTE — Progress Notes (Signed)
Subjective:   Stacey [name] is a 80 y.o. female who presents for Medicare Annual (Subsequent) preventive examination.  Review of Systems    No ROS. Medicare Wellness Visit. Additional risk factors are reflected in social history. Cardiac Risk Factors include: advanced age (>25mn, >>49women);dyslipidemia;family history of premature cardiovascular disease;hypertension Sleep Patterns: No sleep issues, feels rested on waking and sleeps 8 hours nightly. Home Safety/Smoke Alarms: Feels safe in home; uses home alarm. Smoke alarms in place. Living environment: 1-story home; Lives alone; no needs for DME; good support system. Seat Belt Safety/Bike Helmet: Wears seat belt.     Objective:    Today's Vitals   07/08/20 1358 07/08/20 1359  BP: 110/80   Pulse: 90   Temp: 98 F (36.7 C)   SpO2: 97%   Weight: 149 lb 3.2 oz (67.7 kg)   Height: _0  (1.6 m)   PainSc: 9  9    Body mass index is 26.43 kg/m.  Advanced Directives 07/08/2020 08/02/2019 07/06/2019 06/07/2019 05/26/2019 05/23/2019 03/28/2019  Does Patient Have a Medical Advance Directive? _1  Yes Yes  Type of Advance Directive Living will;Healthcare Power of [place] will [place] will Healthcare Power of [place] will -  Does patient want to make changes to medical advance directive? No - Patient declined - No - Patient declined No - Patient declined No - Patient declined - No - Patient declined  Copy of [place] Chart? No - copy requested - - Yes - validated most recent copy scanned in chart (See row information) - No - copy requested -  Would patient like information on creating a medical advance directive? - No - Patient declined No - Patient declined - - - -    Current Medications (verified) Outpatient Encounter Medications as of 07/08/2020  Medication Sig  .  anastrozole (ARIMIDEX) 1 MG tablet TAKE 1 TABLET(1 MG) BY MOUTH DAILY  . aspirin 81 MG EC tablet Take 81 mg by mouth daily.  . calcium-vitamin D (OSCAL WITH D) 500-200 MG-UNIT per tablet Take 1 tablet by mouth daily.  . cholecalciferol (VITAMIN D) 1000 UNITS tablet Take 1,000 Units by mouth daily.  . cholestyramine (QUESTRAN) 4 g packet Take 1 packet (4 g total) by mouth 3 (three) times daily.  . clotrimazole-betamethasone (LOTRISONE) cream Apply 1 application topically 2 (two) times daily as needed.  . diclofenac sodium (VOLTAREN) 1 % GEL Apply 4 g topically 4 (four) times daily as needed.  . diphenhydrAMINE (BENADRYL) 25 MG tablet Take 25 mg by mouth every 6 (six) hours as needed (for bee stings).   . fluticasone (FLONASE) 50 MCG/ACT nasal spray Place 2 sprays into both nostrils daily.  . fulvestrant (FASLODEX) 250 MG/5ML injection Inject 250 mg into the muscle once. One injection each buttock over 1-2 minutes. Warm prior to use.  . loratadine (CLARITIN) 10 MG tablet Take 10 mg by mouth daily.  . Multiple Vitamin (MULTIVITAMIN) capsule Take 1 capsule by mouth daily.  .Marland Kitchenomeprazole (PRILOSEC) 20 MG capsule TAKE 1 CAPSULE(20 MG) BY MOUTH DAILY  . palbociclib (IBRANCE) 100 MG tablet Take 1 tablet (100 mg total) by mouth Monday through Friday for 3 weeks, then one week off, then repeat  . polyethylene glycol (MIRALAX / GLYCOLAX) packet take 1 packet once daily if needed for constipation  . prochlorperazine (COMPAZINE) 10 MG tablet Take 1 tablet (10  mg total) by mouth every 6 (six) hours as needed for nausea or vomiting.  . pseudoephedrine (SUDAFED) 30 MG tablet Take 30 mg by mouth every 4 (four) hours as needed for congestion.   Marland Kitchen telmisartan (MICARDIS) 40 MG tablet TAKE 1 TABLET(40 MG) BY MOUTH DAILY  . Tiotropium Bromide Monohydrate (SPIRIVA RESPIMAT) 2.5 MCG/ACT AERS Inhale 2 puffs into the lungs daily.  Marland Kitchen triamterene-hydrochlorothiazide (DYAZIDE) 37.5-25 MG capsule Take 1 each (1 capsule total)  by mouth in the morning.  . zolpidem (AMBIEN) 10 MG tablet TAKE 1 TABLET(10 MG) BY MOUTH AT BEDTIME AS NEEDED  . [DISCONTINUED] magnesium chloride (SLOW-MAG) 64 MG TBEC SR tablet Take 1 tablet (64 mg total) by mouth 2 (two) times daily. (Patient taking differently: Take 250 mg by mouth daily.)  . [DISCONTINUED] umeclidinium-vilanterol (ANORO ELLIPTA) 62.5-25 MCG/INH AEPB Only open the device one time and take deep breath first thing am   No facility-administered encounter medications on file as of 07/08/2020.    Allergies (verified) Bee venom, Clindamycin/lincomycin, and Codeine   History: Past Medical History:  Diagnosis Date  . ANEMIA-NOS   . ANXIETY   . Breast cancer ([facility]) 1997 L, 2012 R   s/p chemo/xrt  . COPD    resolved  . DIVERTICULOSIS, COLON 2008  . Dizziness   . Family history of breast cancer   . Family history of lung cancer   . Family history of lymphoma   . Family history of pancreatic cancer   . Family history of uterine cancer   . GERD   . Hx of radiation therapy 10/19/11 -12/03/11   right breast  . HYPERLIPIDEMIA   . IRRITABLE BOWEL SYNDROME, HX OF   . Left-sided carotid artery disease (HCC)    moderate left ICA stenosis  . OSTEOARTHRITIS, HAND   . PSVT (paroxysmal supraventricular tachycardia) (HCC)    symptomatic on event monitor   Past Surgical History:  Procedure Laterality Date  . ABDOMINAL HYSTERECTOMY    . APPENDECTOMY    . BREAST BIOPSY  11/14/10    r breast: inv, insitu mammary carcinoma w/calcif, er/pr +, her2 -  . BREAST SURGERY     lumpectomy  . CATARACT EXTRACTION     both eyes  . ELECTROPHYSIOLOGIC STUDY N/A 05/10/2015   Procedure: SVT Ablation;  Surgeon: Will Meredith Leeds, MD;  Location: [facility];  Service: Cardiovascular;  Laterality: N/A;  . HERNIA REPAIR    . inguinal herniorrhapy  1984   left  . IR US GUIDE BX ASP/DRAIN  05/26/2019  . rectal fissure repair    . s/p benign breast biopsy  2003   right  . s/p left foot  surgury  2009  . 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 spiral fx left foot  2008   no surgury  . TMJ ARTHROPLASTY  1989  . Funk- to remove scar tissue growth   . TONSILLECTOMY     Family History  Problem Relation Age of Onset  . Hypertension Mother   . Stroke Mother   . Colon polyps Mother   . Diabetes Mother   . Pancreatic cancer Mother 7  . Uterine cancer Mother 14  . Lymphoma Brother        burkitts  . Lung cancer Paternal Uncle   . Lung cancer Maternal Grandmother 66       non-smoker  . Lung cancer Maternal Grandfather   . Breast cancer Cousin  maternal cousin, dx in her mid 16s  . Brain cancer Cousin        maternal cousin's son; dx in his 30s  . Testicular cancer Cousin        maternal cousin's son;   . Breast cancer Cousin        paternal cousin; dx in her 79s  . Breast cancer Cousin        paternal cousin's daughter; dx in 92s; neg genetic testing  . Colon cancer Neg Hx   . Esophageal cancer Neg Hx   . Stomach cancer Neg Hx   . Rectal cancer Neg Hx    Social History   Socioeconomic History  . Marital status: Divorced    Spouse name: 2 Step-children  . Number of children: 2  . Years of education: Not on file  . Highest education level: Not on file  Occupational History  . Occupation: retired Banker  Tobacco Use  . Smoking status: Former Smoker    Packs/day: 1.00    Years: 54.00    Pack years: 54.00    Types: Cigarettes    Quit date: 01/03/2016    Years since quitting: 4.5  . Smokeless tobacco: Never Used  Vaping Use  . Vaping Use: Never used  Substance and Sexual Activity  . Alcohol use: Yes    Comment: rare/ drinks socially  . Drug use: No  . Sexual activity: Never  Other Topics Concern  . Not on file  Social History Narrative   Patient gets no regular exercise   No biological children   2 step children   Social Determinants of Health   Financial Resource Strain: Low Risk   . Difficulty of Paying  Living Expenses: Not hard at all  Food Insecurity: No Food Insecurity  . Worried About Charity fundraiser in the Last Year: Never true  . Ran Out of Food in the Last Year: Never true  Transportation Needs: No Transportation Needs  . Lack of Transportation (Medical): No  . Lack of Transportation (Non-Medical): No  Physical Activity: Sufficiently Active  . Days of Exercise per Week: 5 days  . Minutes of Exercise per Session: 30 min  Stress: No Stress Concern Present  . Feeling of Stress : Not at all  Social Connections: Moderately Integrated  . Frequency of Communication with Friends and Family: More than three times a week  . Frequency of Social Gatherings with Friends and Family: More than three times a week  . Attends Religious Services: More than 4 times per year  . Active Member of Clubs or Organizations: No  . Attends Archivist Meetings: More than 4 times per year  . Marital Status: Widowed    Tobacco Counseling Counseling given: Not Answered   Clinical Intake:  Pre-visit preparation completed: Yes  Pain : 0-10 Pain Score: 9  Pain Type: Chronic pain Pain Location: Neck Pain Orientation: Left Pain Descriptors / Indicators: Throbbing,Aching,Discomfort Pain Onset: More than a month ago Pain Frequency: Intermittent Pain Relieving Factors: n/a Effect of Pain on Daily Activities: Pain can diminish job performance, lower motivation to exercise, and prevent you from completing daily tasks. Pain produces disability and affects the quality of life.  Pain Relieving Factors: n/a  BMI - recorded: 26.43 Nutritional Status: BMI 25 -29 Overweight Nutritional Risks: None Diabetes: No  How often do you need to have someone help you when you read instructions, pamphlets, or other written materials from your doctor or pharmacy?: 1 - Never What  is the last grade level you completed in school?: Bachelor's Degree  Diabetic? no  Interpreter Needed?: No  Information  entered by :: Lisette Abu, LPN   Activities of Daily Living In your present state of health, do you have any difficulty performing the following activities: 07/08/2020  Hearing? N  Vision? N  Difficulty concentrating or making decisions? N  Walking or climbing stairs? N  Dressing or bathing? N  Doing errands, shopping? N  Preparing Food and eating ? N  Using the Toilet? N  In the past six months, have you accidently leaked urine? N  Do you have problems with loss of bowel control? N  Managing your Medications? N  Managing your Finances? N  Housekeeping or managing your Housekeeping? N  Some recent data might be hidden    Patient Care Team: Biagio Borg, MD as PCP - General (Internal Medicine) Tyler Pita, MD as Consulting Physician (Radiation Oncology) Roseanne Kaufman, MD as Consulting Physician (Orthopedic Surgery) Lyndal Pulley, DO as Consulting Physician (Family Medicine) Magrinat, Virgie Dad, MD as Consulting Physician (Oncology)  Indicate any recent Medical Services you may have received from other than [facility] providers in the past year (date may be approximate).     Assessment:   This is a routine wellness examination for [facility].  Hearing/Vision screen No exam data present  Dietary issues and exercise activities discussed: Current Exercise Habits: Home exercise routine, Type of exercise: treadmill, Time (Minutes): 30, Frequency (Times/Week): 5, Weekly Exercise (Minutes/Week): 150, Intensity: Mild, Exercise limited by: respiratory conditions(s);cardiac condition(s)  Goals Addressed            This Visit's Progress   . Patient Stated       I want to get back outside so that I can walk and be more active.      Depression Screen PHQ 2/9 Scores 07/08/2020 07/08/2020 05/23/2019 02/10/2019 05/20/2018 04/09/2017 05/14/2016  PHQ - 2 Score 0 0 0 0 0 0 0    Fall Risk Fall Risk  07/08/2020 05/23/2019 02/10/2019 05/20/2018 04/09/2017  Falls in the past year? 0 0 1 0 No  Number  falls in past yr: 0 0 0 0 -  Injury with Fall? 0 0 0 - -  Comment - - - - -  Risk for fall due to : No Fall Risks No Fall Risks - - -  Follow up - Falls evaluation completed;Education provided;Falls prevention discussed - Falls prevention discussed -    FALL RISK PREVENTION PERTAINING TO THE HOME:  Any stairs in or around the home? No  If so, are there any without handrails? No  Home free of loose throw rugs in walkways, pet beds, electrical cords, etc? Yes  Adequate lighting in your home to reduce risk of falls? Yes   ASSISTIVE DEVICES UTILIZED TO PREVENT FALLS:  Life alert? No  Use of a cane, walker or w/c? No  Grab bars in the bathroom? Yes  Shower chair or bench in shower? Yes  Elevated toilet seat or a handicapped toilet? Yes   TIMED UP AND GO:  Was the test performed? No .  Length of time to ambulate 10 feet: 0 sec.   Gait steady and fast without use of assistive device  Cognitive Function: Normal cognitive status assessed by direct observation by this Nurse Health Advisor. No abnormalities found.          Immunizations Immunization History  Administered Date(s) Administered  . Fluad Quad(high Dose 65+) 11/03/2018  . Influenza Split  12/17/2011  . Influenza Whole 12/17/2006, 10/23/2008, 10/18/2010  . Influenza, High Dose Seasonal PF 11/16/2013, 11/26/2017  . Influenza-Unspecified 11/02/2012, 11/17/2014, 11/05/2015, 11/03/2019  . PFIZER(Purple Top)SARS-COV-2 Vaccination 02/27/2019, 03/27/2019, 09/20/2019  . Pneumococcal Conjugate-13 12/15/2013  . Pneumococcal Polysaccharide-23 01/20/2006  . Td 06/14/2008  . Tdap 02/10/2019    TDAP status: Up to date  Flu Vaccine status: Up to date  Pneumococcal vaccine status: Up to date  Covid-19 vaccine status: Completed vaccines  Qualifies for Shingles Vaccine? Yes   Zostavax completed No   Shingrix Completed?: No.    Education has been provided regarding the importance of this vaccine. Patient has been advised to  call insurance company to determine out of pocket expense if they have not yet received this vaccine. Advised may also receive vaccine at local pharmacy or Health Dept. Verbalized acceptance and understanding.  Screening Tests Health Maintenance  Topic Date Due  . Pneumococcal Vaccine 68-50 Years old (1 of 4 - PCV13) Never done  . Zoster Vaccines- Shingrix (1 of 2) Never done  . COVID-19 Vaccine (4 - Booster for [place] series) 12/21/2019  . INFLUENZA VACCINE  09/02/2020  . TETANUS/TDAP  02/09/2029  . DEXA SCAN  Completed  . PNA vac Low Risk Adult  Completed  . HPV VACCINES  Aged Out    Health Maintenance  Health Maintenance Due  Topic Date Due  . Pneumococcal Vaccine 33-40 Years old (1 of 4 - PCV13) Never done  . Zoster Vaccines- Shingrix (1 of 2) Never done  . COVID-19 Vaccine (4 - Booster for Pfizer series) 12/21/2019    Colorectal cancer screening: No longer required.   Mammogram status: Completed 12/27/2018. Repeat every year  Bone Density status: Completed 04/02/2015. Results reflect: Bone density results: OSTEOPENIA. Repeat every 2 years. (no longer recommended)  Lung Cancer Screening: (Low Dose CT Chest recommended if Age 61-80 years, 30 pack-year currently smoking OR have quit w/in 15years.) does not qualify.   Lung Cancer Screening Referral: no  Additional Screening:  Hepatitis C Screening: does not qualify; Completed no  Vision Screening: Recommended annual ophthalmology exams for early detection of glaucoma and other disorders of the eye. Is the patient up to date with their annual eye exam?  Yes  Who is the provider or what is the name of the office in which the patient attends annual eye exams? [facility] If pt is not established with a provider, would they like to be referred to a provider to establish care? No .   Dental Screening: Recommended annual dental exams for proper oral hygiene  Community Resource Referral / Chronic Care Management: CRR  required this visit?  No   CCM required this visit?  No      Plan:     I have personally reviewed and noted the following in the patient's chart:   . Medical and social history . Use of alcohol, tobacco or illicit drugs  . Current medications and supplements including opioid prescriptions.  . Functional ability and status . Nutritional status . Physical activity . Advanced directives . List of other physicians . Hospitalizations, surgeries, and ER visits in previous 12 months . Vitals . Screenings to include cognitive, depression, and falls . Referrals and appointments  In addition, I have reviewed and discussed with patient certain preventive protocols, quality metrics, and best practice recommendations. A written personalized care plan for preventive services as well as general preventive health recommendations were provided to patient.     Sheral Flow, LPN  07/08/2020   Nurse Notes:  Medications reviewed with patient; no opioid use noted.

## 2020-07-08 NOTE — Assessment & Plan Note (Signed)
With seasonal flare, for depomedrol im 80, cough med prn,  to f/u any worsening symptoms or concerns

## 2020-07-08 NOTE — Assessment & Plan Note (Signed)
BP Readings from Last 3 Encounters:  07/08/20 110/80  07/08/20 110/80  07/04/20 (!) 131/58   Stable, pt to continue medical treatment micardis, dyazide

## 2020-07-10 ENCOUNTER — Other Ambulatory Visit: Payer: Self-pay

## 2020-07-10 MED ORDER — TELMISARTAN 40 MG PO TABS: ORAL_TABLET | ORAL | 1 refills | Status: DC

## 2020-08-01 ENCOUNTER — Inpatient Hospital Stay: Payer: Medicare Other

## 2020-08-01 ENCOUNTER — Other Ambulatory Visit: Payer: Self-pay

## 2020-08-01 VITALS — BP 129/66 | HR 98 | Temp 98.3°F | Resp 20

## 2020-08-01 DIAGNOSIS — C50911 Malignant neoplasm of unspecified site of right female breast: Secondary | ICD-10-CM

## 2020-08-01 DIAGNOSIS — C7951 Secondary malignant neoplasm of bone: Secondary | ICD-10-CM

## 2020-08-01 DIAGNOSIS — Z170 Estrogen receptor positive status [ER+]: Secondary | ICD-10-CM | POA: Diagnosis not present

## 2020-08-01 DIAGNOSIS — C50211 Malignant neoplasm of upper-inner quadrant of right female breast: Secondary | ICD-10-CM | POA: Diagnosis not present

## 2020-08-01 DIAGNOSIS — C50212 Malignant neoplasm of upper-inner quadrant of left female breast: Secondary | ICD-10-CM

## 2020-08-01 DIAGNOSIS — R9389 Abnormal findings on diagnostic imaging of other specified body structures: Secondary | ICD-10-CM

## 2020-08-01 DIAGNOSIS — Z79818 Long term (current) use of other agents affecting estrogen receptors and estrogen levels: Secondary | ICD-10-CM | POA: Diagnosis not present

## 2020-08-01 DIAGNOSIS — C50912 Malignant neoplasm of unspecified site of left female breast: Secondary | ICD-10-CM

## 2020-08-01 LAB — COMPREHENSIVE METABOLIC PANEL
ALT: 11 U/L (ref 0–44)
AST: 19 U/L (ref 15–41)
Albumin: 3.5 g/dL (ref 3.5–5.0)
Alkaline Phosphatase: 67 U/L (ref 38–126)
Anion gap: 11 (ref 5–15)
BUN: 32 mg/dL — ABNORMAL HIGH (ref 8–23)
CO2: 20 mmol/L — ABNORMAL LOW (ref 22–32)
Calcium: 9.5 mg/dL (ref 8.9–10.3)
Chloride: 107 mmol/L (ref 98–111)
Creatinine, Ser: 1.37 mg/dL — ABNORMAL HIGH (ref 0.44–1.00)
GFR, Estimated: 39 mL/min — ABNORMAL LOW (ref >60)
Glucose, Bld: 100 mg/dL — ABNORMAL HIGH (ref 70–99)
Potassium: 4.2 mmol/L (ref 3.5–5.1)
Sodium: 138 mmol/L (ref 135–145)
Total Bilirubin: 0.3 mg/dL (ref 0.3–1.2)
Total Protein: 7.7 g/dL (ref 6.5–8.1)

## 2020-08-01 LAB — CBC WITH DIFFERENTIAL/PLATELET
Abs Immature Granulocytes: 0.01 10*3/uL (ref 0.00–0.07)
Basophils Absolute: 0 10*3/uL (ref 0.0–0.1)
Basophils Relative: 1 %
Eosinophils Absolute: 0.1 10*3/uL (ref 0.0–0.5)
Eosinophils Relative: 2 %
HCT: 28.6 % — ABNORMAL LOW (ref 36.0–46.0)
Hemoglobin: 9.8 g/dL — ABNORMAL LOW (ref 12.0–15.0)
Immature Granulocytes: 0 %
Lymphocytes Relative: 21 %
Lymphs Abs: 0.6 10*3/uL — ABNORMAL LOW (ref 0.7–4.0)
MCH: 35.6 pg — ABNORMAL HIGH (ref 26.0–34.0)
MCHC: 34.3 g/dL (ref 30.0–36.0)
MCV: 104 fL — ABNORMAL HIGH (ref 80.0–100.0)
Monocytes Absolute: 0.3 10*3/uL (ref 0.1–1.0)
Monocytes Relative: 10 %
Neutro Abs: 2 10*3/uL (ref 1.7–7.7)
Neutrophils Relative %: 66 %
Platelets: 193 10*3/uL (ref 150–400)
RBC: 2.75 MIL/uL — ABNORMAL LOW (ref 3.87–5.11)
RDW: 13.8 % (ref 11.5–15.5)
WBC: 3 10*3/uL — ABNORMAL LOW (ref 4.0–10.5)
nRBC: 0 % (ref 0.0–0.2)

## 2020-08-01 LAB — MAGNESIUM: Magnesium: 1.3 mg/dL — ABNORMAL LOW (ref 1.7–2.4)

## 2020-08-01 MED ORDER — FULVESTRANT 250 MG/5ML IM SOLN
500.0000 mg | Freq: Once | INTRAMUSCULAR | Status: AC
  Administered 2020-08-01: 500 mg via INTRAMUSCULAR

## 2020-08-01 MED ORDER — FULVESTRANT 250 MG/5ML IM SOLN
INTRAMUSCULAR | Status: AC
  Filled 2020-08-01: qty 10

## 2020-08-01 NOTE — Patient Instructions (Signed)
Fulvestrant injection What is this medication? FULVESTRANT (ful VES trant) blocks the effects of estrogen. It is used to treat breast cancer. This medicine may be used for other purposes; ask your health care provider or pharmacist if you have questions. COMMON BRAND NAME(S): FASLODEX What should I tell my care team before I take this medication? They need to know if you have any of these conditions: bleeding disorders liver disease low blood counts, like low white cell, platelet, or red cell counts an unusual or allergic reaction to fulvestrant, other medicines, foods, dyes, or preservatives pregnant or trying to get pregnant breast-feeding How should I use this medication? This medicine is for injection into a muscle. It is usually given by a health care professional in a hospital or clinic setting. Talk to your pediatrician regarding the use of this medicine in children. Special care may be needed. Overdosage: If you think you have taken too much of this medicine contact a poison control center or emergency room at once. NOTE: This medicine is only for you. Do not share this medicine with others. What if I miss a dose? It is important not to miss your dose. Call your doctor or health care professional if you are unable to keep an appointment. What may interact with this medication? medicines that treat or prevent blood clots like warfarin, enoxaparin, dalteparin, apixaban, dabigatran, and rivaroxaban This list may not describe all possible interactions. Give your health care provider a list of all the medicines, herbs, non-prescription drugs, or dietary supplements you use. Also tell them if you smoke, drink alcohol, or use illegal drugs. Some items may interact with your medicine. What should I watch for while using this medication? Your condition will be monitored carefully while you are receiving this medicine. You will need important blood work done while you are taking this  medicine. Do not become pregnant while taking this medicine or for at least 1 year after stopping it. Women of child-bearing potential will need to have a negative pregnancy test before starting this medicine. Women should inform their doctor if they wish to become pregnant or think they might be pregnant. There is a potential for serious side effects to an unborn child. Men should inform their doctors if they wish to father a child. This medicine may lower sperm counts. Talk to your health care professional or pharmacist for more information. Do not breast-feed an infant while taking this medicine or for 1 year after the last dose. What side effects may I notice from receiving this medication? Side effects that you should report to your doctor or health care professional as soon as possible: allergic reactions like skin rash, itching or hives, swelling of the face, lips, or tongue feeling faint or lightheaded, falls pain, tingling, numbness, or weakness in the legs signs and symptoms of infection like fever or chills; cough; flu-like symptoms; sore throat vaginal bleeding Side effects that usually do not require medical attention (report to your doctor or health care professional if they continue or are bothersome): aches, pains constipation diarrhea headache hot flashes nausea, vomiting pain at site where injected stomach pain This list may not describe all possible side effects. Call your doctor for medical advice about side effects. You may report side effects to FDA at 1-800-FDA-1088. Where should I keep my medication? This drug is given in a hospital or clinic and will not be stored at home. NOTE: This sheet is a summary. It may not cover all possible information. If you have   questions about this medicine, talk to your doctor, pharmacist, or health care provider.  2022 Elsevier/Gold Standard (2017-04-29 11:34:41)  

## 2020-08-02 LAB — CANCER ANTIGEN 27.29: CA 27.29: 50.8 U/mL — ABNORMAL HIGH (ref 0.0–38.6)

## 2020-08-12 ENCOUNTER — Other Ambulatory Visit: Payer: Self-pay | Admitting: Internal Medicine

## 2020-08-15 ENCOUNTER — Telehealth: Payer: Self-pay | Admitting: *Deleted

## 2020-08-15 DIAGNOSIS — Z23 Encounter for immunization: Secondary | ICD-10-CM | POA: Diagnosis not present

## 2020-08-15 NOTE — Chronic Care Management (AMB) (Signed)
  Chronic Care Management   Note  08/15/2020 Name: Stacey [name] MRN: [id number] DOB: [birth year]  Stacey [name] is a 80 y.o. year old female who is a primary care patient of Biagio Borg, MD. I reached out to [name] by phone today in response to a referral sent by Stacey [name]'s PCP Biagio Borg, MD     Stacey [name] was given information about Chronic Care Management services today including:  CCM service includes personalized support from designated clinical staff supervised by her physician, including individualized plan of care and coordination with other care providers 24/7 contact phone numbers for assistance for urgent and routine care needs. Service will only be billed when office clinical staff spend 20 minutes or more in a month to coordinate care. Only one practitioner may furnish and bill the service in a calendar month. The patient may stop CCM services at any time (effective at the end of the month) by phone call to the office staff. The patient will be responsible for cost sharing (co-pay) of up to 20% of the service fee (after annual deductible is met).  Patient agreed to services and verbal consent obtained.   Follow up plan: Telephone appointment with care management team member scheduled for:09/05/2020  Julian Hy, [facility]  Direct Dial: [phone]

## 2020-08-29 ENCOUNTER — Encounter: Payer: Self-pay | Admitting: Adult Health

## 2020-08-29 ENCOUNTER — Inpatient Hospital Stay: Payer: Medicare Other | Attending: Adult Health

## 2020-08-29 ENCOUNTER — Inpatient Hospital Stay: Payer: Medicare Other

## 2020-08-29 ENCOUNTER — Inpatient Hospital Stay (HOSPITAL_BASED_OUTPATIENT_CLINIC_OR_DEPARTMENT_OTHER): Payer: Medicare Other | Admitting: Adult Health

## 2020-08-29 ENCOUNTER — Other Ambulatory Visit: Payer: Self-pay

## 2020-08-29 VITALS — BP 138/57 | HR 78 | Temp 97.1°F | Resp 19 | Ht 63.0 in | Wt 149.2 lb

## 2020-08-29 DIAGNOSIS — R9389 Abnormal findings on diagnostic imaging of other specified body structures: Secondary | ICD-10-CM

## 2020-08-29 DIAGNOSIS — K219 Gastro-esophageal reflux disease without esophagitis: Secondary | ICD-10-CM | POA: Diagnosis not present

## 2020-08-29 DIAGNOSIS — Z79818 Long term (current) use of other agents affecting estrogen receptors and estrogen levels: Secondary | ICD-10-CM | POA: Diagnosis not present

## 2020-08-29 DIAGNOSIS — C7951 Secondary malignant neoplasm of bone: Secondary | ICD-10-CM

## 2020-08-29 DIAGNOSIS — Z79811 Long term (current) use of aromatase inhibitors: Secondary | ICD-10-CM | POA: Insufficient documentation

## 2020-08-29 DIAGNOSIS — J449 Chronic obstructive pulmonary disease, unspecified: Secondary | ICD-10-CM | POA: Insufficient documentation

## 2020-08-29 DIAGNOSIS — C50212 Malignant neoplasm of upper-inner quadrant of left female breast: Secondary | ICD-10-CM | POA: Diagnosis not present

## 2020-08-29 DIAGNOSIS — Z923 Personal history of irradiation: Secondary | ICD-10-CM | POA: Insufficient documentation

## 2020-08-29 DIAGNOSIS — C50912 Malignant neoplasm of unspecified site of left female breast: Secondary | ICD-10-CM

## 2020-08-29 DIAGNOSIS — Z79899 Other long term (current) drug therapy: Secondary | ICD-10-CM | POA: Insufficient documentation

## 2020-08-29 DIAGNOSIS — E785 Hyperlipidemia, unspecified: Secondary | ICD-10-CM | POA: Diagnosis not present

## 2020-08-29 DIAGNOSIS — C50211 Malignant neoplasm of upper-inner quadrant of right female breast: Secondary | ICD-10-CM | POA: Diagnosis not present

## 2020-08-29 DIAGNOSIS — Z803 Family history of malignant neoplasm of breast: Secondary | ICD-10-CM | POA: Diagnosis not present

## 2020-08-29 DIAGNOSIS — Z801 Family history of malignant neoplasm of trachea, bronchus and lung: Secondary | ICD-10-CM | POA: Insufficient documentation

## 2020-08-29 DIAGNOSIS — Z7982 Long term (current) use of aspirin: Secondary | ICD-10-CM | POA: Insufficient documentation

## 2020-08-29 DIAGNOSIS — Z170 Estrogen receptor positive status [ER+]: Secondary | ICD-10-CM

## 2020-08-29 DIAGNOSIS — M8580 Other specified disorders of bone density and structure, unspecified site: Secondary | ICD-10-CM | POA: Insufficient documentation

## 2020-08-29 DIAGNOSIS — Z87891 Personal history of nicotine dependence: Secondary | ICD-10-CM | POA: Insufficient documentation

## 2020-08-29 DIAGNOSIS — M1990 Unspecified osteoarthritis, unspecified site: Secondary | ICD-10-CM | POA: Diagnosis not present

## 2020-08-29 DIAGNOSIS — C50911 Malignant neoplasm of unspecified site of right female breast: Secondary | ICD-10-CM

## 2020-08-29 DIAGNOSIS — I471 Supraventricular tachycardia: Secondary | ICD-10-CM | POA: Insufficient documentation

## 2020-08-29 LAB — CBC WITH DIFFERENTIAL/PLATELET
Abs Immature Granulocytes: 0 10*3/uL (ref 0.00–0.07)
Basophils Absolute: 0 10*3/uL (ref 0.0–0.1)
Basophils Relative: 1 %
Eosinophils Absolute: 0 10*3/uL (ref 0.0–0.5)
Eosinophils Relative: 1 %
HCT: 28.2 % — ABNORMAL LOW (ref 36.0–46.0)
Hemoglobin: 9.6 g/dL — ABNORMAL LOW (ref 12.0–15.0)
Immature Granulocytes: 0 %
Lymphocytes Relative: 28 %
Lymphs Abs: 0.9 10*3/uL (ref 0.7–4.0)
MCH: 35.7 pg — ABNORMAL HIGH (ref 26.0–34.0)
MCHC: 34 g/dL (ref 30.0–36.0)
MCV: 104.8 fL — ABNORMAL HIGH (ref 80.0–100.0)
Monocytes Absolute: 0.2 10*3/uL (ref 0.1–1.0)
Monocytes Relative: 8 %
Neutro Abs: 2 10*3/uL (ref 1.7–7.7)
Neutrophils Relative %: 62 %
Platelets: 167 10*3/uL (ref 150–400)
RBC: 2.69 MIL/uL — ABNORMAL LOW (ref 3.87–5.11)
RDW: 14.3 % (ref 11.5–15.5)
WBC: 3.2 10*3/uL — ABNORMAL LOW (ref 4.0–10.5)
nRBC: 0 % (ref 0.0–0.2)

## 2020-08-29 LAB — COMPREHENSIVE METABOLIC PANEL
ALT: 12 U/L (ref 0–44)
AST: 19 U/L (ref 15–41)
Albumin: 3.6 g/dL (ref 3.5–5.0)
Alkaline Phosphatase: 62 U/L (ref 38–126)
Anion gap: 10 (ref 5–15)
BUN: 22 mg/dL (ref 8–23)
CO2: 21 mmol/L — ABNORMAL LOW (ref 22–32)
Calcium: 9.5 mg/dL (ref 8.9–10.3)
Chloride: 108 mmol/L (ref 98–111)
Creatinine, Ser: 1.23 mg/dL — ABNORMAL HIGH (ref 0.44–1.00)
GFR, Estimated: 44 mL/min — ABNORMAL LOW (ref >60)
Glucose, Bld: 98 mg/dL (ref 70–99)
Potassium: 4.4 mmol/L (ref 3.5–5.1)
Sodium: 139 mmol/L (ref 135–145)
Total Bilirubin: 0.2 mg/dL — ABNORMAL LOW (ref 0.3–1.2)
Total Protein: 7.4 g/dL (ref 6.5–8.1)

## 2020-08-29 LAB — MAGNESIUM: Magnesium: 1.3 mg/dL — ABNORMAL LOW (ref 1.7–2.4)

## 2020-08-29 MED ORDER — FULVESTRANT 250 MG/5ML IM SOLN
500.0000 mg | Freq: Once | INTRAMUSCULAR | Status: AC
  Administered 2020-08-29: 500 mg via INTRAMUSCULAR

## 2020-08-29 NOTE — Patient Instructions (Signed)
Fulvestrant injection What is this medication? FULVESTRANT (ful VES trant) blocks the effects of estrogen. It is used to treat breast cancer. This medicine may be used for other purposes; ask your health care provider or pharmacist if you have questions. COMMON BRAND NAME(S): FASLODEX What should I tell my care team before I take this medication? They need to know if you have any of these conditions: bleeding disorders liver disease low blood counts, like low white cell, platelet, or red cell counts an unusual or allergic reaction to fulvestrant, other medicines, foods, dyes, or preservatives pregnant or trying to get pregnant breast-feeding How should I use this medication? This medicine is for injection into a muscle. It is usually given by a health care professional in a hospital or clinic setting. Talk to your pediatrician regarding the use of this medicine in children. Special care may be needed. Overdosage: If you think you have taken too much of this medicine contact a poison control center or emergency room at once. NOTE: This medicine is only for you. Do not share this medicine with others. What if I miss a dose? It is important not to miss your dose. Call your doctor or health care professional if you are unable to keep an appointment. What may interact with this medication? medicines that treat or prevent blood clots like warfarin, enoxaparin, dalteparin, apixaban, dabigatran, and rivaroxaban This list may not describe all possible interactions. Give your health care provider a list of all the medicines, herbs, non-prescription drugs, or dietary supplements you use. Also tell them if you smoke, drink alcohol, or use illegal drugs. Some items may interact with your medicine. What should I watch for while using this medication? Your condition will be monitored carefully while you are receiving this medicine. You will need important blood work done while you are taking this  medicine. Do not become pregnant while taking this medicine or for at least 1 year after stopping it. Women of child-bearing potential will need to have a negative pregnancy test before starting this medicine. Women should inform their doctor if they wish to become pregnant or think they might be pregnant. There is a potential for serious side effects to an unborn child. Men should inform their doctors if they wish to father a child. This medicine may lower sperm counts. Talk to your health care professional or pharmacist for more information. Do not breast-feed an infant while taking this medicine or for 1 year after the last dose. What side effects may I notice from receiving this medication? Side effects that you should report to your doctor or health care professional as soon as possible: allergic reactions like skin rash, itching or hives, swelling of the face, lips, or tongue feeling faint or lightheaded, falls pain, tingling, numbness, or weakness in the legs signs and symptoms of infection like fever or chills; cough; flu-like symptoms; sore throat vaginal bleeding Side effects that usually do not require medical attention (report to your doctor or health care professional if they continue or are bothersome): aches, pains constipation diarrhea headache hot flashes nausea, vomiting pain at site where injected stomach pain This list may not describe all possible side effects. Call your doctor for medical advice about side effects. You may report side effects to FDA at 1-800-FDA-1088. Where should I keep my medication? This drug is given in a hospital or clinic and will not be stored at home. NOTE: This sheet is a summary. It may not cover all possible information. If you have   questions about this medicine, talk to your doctor, pharmacist, or health care provider.  2022 Elsevier/Gold Standard (2017-04-29 11:34:41)  

## 2020-08-29 NOTE — Progress Notes (Signed)
ID: Stacey [name]   DOB: 04-Nov-1940  MR#: [id number]  HWK#:[id number]  Patient Care Team: Biagio Borg, MD as PCP - General (Internal Medicine) Tyler Pita, MD as Consulting Physician (Radiation Oncology) Roseanne Kaufman, MD as Consulting Physician (Orthopedic Surgery) Lyndal Pulley, DO as Consulting Physician (Family Medicine) Magrinat, Virgie Dad, MD as Consulting Physician (Oncology) Monna Fam, MD as Consulting Physician (Ophthalmology) Knox Royalty, RN as [place] Management OTHER MD:   CHIEF COMPLAINT: metastatic breast cancer  CURRENT TREATMENT: anastrozole; palbociclib/Ibrance; denosumab/Xgeva every 12 weeks; fulvestrant   INTERVAL HISTORY:   [name] returns today for follow up of her metastatic breast cancer.  Her most recent restaging chest CT on 05/30/2020 showing: mild decrease in size of left chest wall/axillary mass and mediastinal lymphadenopathy; no new or progressive metastatic disease.  She notes over the past several weeks that her lymphedema has worsened.  She also notes a spastic left thumb, and continues to have no feeling in her first three digits.    She started on anastrozole 06/02/2019. Hot flashes and vaginal dryness so far are not an issue.  She continues on palbociclib, currently at 128m Monday through Friday (not Saturday or Sunday); three weeks on and one week off. She is having no side effects from this that she is aware of.  She also receives fulvestrant, every 4 weeks.  She tolerates the injections without any difficulty.  She receives denosumab/Xgeva every 12 weeks, most recently on 06/06/2020.  She is going to need to undergo dental surgery.  She says that the gum has receded to the bone.  She gets some foot cramps from time to time. She is taking magnesium daily.  She got sick in June with a worsening cough--she was seen by her PCP.  She says she is still having some post nasal drainage.  She notes that it is thick, green,  and smells.  She is hoping to get into ENT since it has been persistent, and find a treatment which can help her.  We are following her CA 27-29:  Lab Results  Component Value Date   CA2729 50.8 (H) 08/01/2020   CA2729 51.3 (H) 07/04/2020   CA2729 43.4 (H) 06/06/2020   CA2729 37.8 05/09/2020   CA2729 44.7 (H) 04/11/2020    REVIEW OF SYSTEMS: Review of Systems  Constitutional:  Positive for fatigue. Negative for appetite change, chills, fever and unexpected weight change.  HENT:   Positive for sore throat. Negative for hearing loss, lump/mass, mouth sores and trouble swallowing.   Eyes:  Negative for eye problems and icterus.  Respiratory:  Positive for cough. Negative for chest tightness and shortness of breath.   Cardiovascular:  Negative for chest pain, leg swelling and palpitations.  Gastrointestinal:  Negative for abdominal distention, abdominal pain, constipation, diarrhea, nausea and vomiting.  Endocrine: Negative for hot flashes.  Genitourinary:  Negative for difficulty urinating.   Musculoskeletal:  Negative for arthralgias.  Skin:  Negative for itching and rash.  Neurological:  Negative for dizziness, extremity weakness, headaches and numbness.  Hematological:  Negative for adenopathy. Does not bruise/bleed easily.  Psychiatric/Behavioral:  Negative for depression. The patient is not nervous/anxious.       COVID 19 VACCINATION STATUS: Fully vaccinated with booster in September 2021   HISTORY OF PRESENT ILLNESS:  From the original intake note:  " Stacey Hinoteis a 80year old [name], who has a history of breast cancer starting in 1997 and in 2013.  On 11/22/95, she underwent a left breast biopsy, which revealed malignant cells.  She had a left breast lumpectomy with re-excision on 11/29/95, with final pathology revealed IDC, grade 2, 2.5 cm, ER 98%, PR 97%, Ki67 8% with one of 15 nodes being positive.  She received chemotherapy, 4 cycles per patient, along with  radiation and took Tamoxifen for 7 years following radiation completion.  Records from 1997 limited.    In October 2012, she had a biopsy of the right breast after mammography recommended additional images for a dense area in the right breast.  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  On 11/25/10, she had an MRI that measured the area in the right breast as 2.2 cm.  She started neoadjuvant [name] in November 2012 and has continued it since.  The area of concern measured 1.7 cm on a repeat MRI in March of 2013.  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  Surgical margins were negative with one of one sentinel lymph node positive for metastatic carcinoma.  She then underwent radiation therapy under the care of Dr. [name] from 10/19/11 to 12/03/11. "   Her subsequent history is as detailed below   PAST MEDICAL HISTORY: Past Medical History:  Diagnosis Date   ANEMIA-NOS    ANXIETY    Breast cancer ([facility] [place]) 1997 L, 2012 R   s/p chemo/xrt   COPD    resolved   DIVERTICULOSIS, COLON 2008   Dizziness    Family history of breast cancer    Family history of lung cancer    Family history of lymphoma    Family history of pancreatic cancer    Family history of uterine cancer    GERD    Hx of radiation therapy 10/19/11 -12/03/11   right breast   HYPERLIPIDEMIA    IRRITABLE BOWEL SYNDROME, HX OF    Left-sided carotid artery disease (HCC)    moderate left ICA stenosis   OSTEOARTHRITIS, HAND    PSVT (paroxysmal supraventricular tachycardia) ([place])    symptomatic on event monitor    PAST SURGICAL HISTORY: Past Surgical History:  Procedure Laterality Date   ABDOMINAL HYSTERECTOMY     APPENDECTOMY     BREAST BIOPSY   11/14/10    r breast: inv, insitu mammary carcinoma w/calcif, er/pr +, her2 -   BREAST SURGERY     lumpectomy   CATARACT EXTRACTION     both eyes   ELECTROPHYSIOLOGIC STUDY N/A 05/10/2015   Procedure: SVT Ablation;  Surgeon: Will Meredith Leeds, MD;  Location: [facility];  Service: Cardiovascular;  Laterality: N/A;   HERNIA REPAIR     inguinal herniorrhapy  1984   left   IR US GUIDE BX ASP/DRAIN  05/26/2019   rectal fissure repair     s/p benign breast biopsy  2003   right   s/p left foot surgury  2009   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   spiral fx left foot  2008   no surgury   TMJ ARTHROPLASTY  1989   TONGUE SURGERY     1988- to remove scar tissue growth    TONSILLECTOMY      FAMILY HISTORY Family History  Problem Relation Age of  Onset   Hypertension Mother    Stroke Mother    Colon polyps Mother    Diabetes Mother    Pancreatic cancer Mother 68   Uterine cancer Mother 89   Lymphoma Brother        burkitts   Lung cancer Paternal Uncle    Lung cancer Maternal Grandmother 2       non-smoker   Lung cancer Maternal Grandfather    Breast cancer Cousin        maternal cousin, dx in her mid 58s   Brain cancer Cousin        maternal cousin's son; dx in his 36s   Testicular cancer Cousin        maternal cousin's son;    Breast cancer Cousin        paternal cousin; dx in her 61s   Breast cancer Cousin        paternal cousin's daughter; dx in 42s; neg genetic testing   Colon cancer Neg Hx    Esophageal cancer Neg Hx    Stomach cancer Neg Hx    Rectal cancer Neg Hx     GYNECOLOGIC HISTORY:   Menarche age 14, GX P0 (one miscarriage at 88 months). Status post vaginal hysterectomy in the late 1970s, no salpingo-oophorectomy    SOCIAL HISTORY:   The patient lives alone and is divorced. She retired from [employer], were she worked in Physiological scientist.   ADVANCED DIRECTIVES:  Living will in place; the patient's healthcare power of attorney is her  stepdaughter, Vladimir Faster, who can be reached at [place]: Social History   Tobacco Use   Smoking status: Former    Packs/day: 1.00    Years: 54.00    Pack years: 54.00    Types: Cigarettes    Quit date: 01/03/2016    Years since quitting: 4.6   Smokeless tobacco: Never  Vaping Use   Vaping Use: Never used  Substance Use Topics   Alcohol use: Yes    Comment: rare/ drinks socially   Drug use: No    Allergies  Allergen Reactions   Bee Venom Swelling   Clindamycin/Lincomycin Diarrhea and Nausea And Vomiting   Codeine Nausea And Vomiting    Current Outpatient Medications  Medication Sig Dispense Refill   anastrozole (ARIMIDEX) 1 MG tablet TAKE 1 TABLET(1 MG) BY MOUTH DAILY 90 tablet 4   aspirin 81 MG EC tablet Take 81 mg by mouth daily.     calcium-vitamin D (OSCAL WITH D) 500-200 MG-UNIT per tablet Take 1 tablet by mouth daily.     cholecalciferol (VITAMIN D) 1000 UNITS tablet Take 1,000 Units by mouth daily.     cholestyramine (QUESTRAN) 4 g packet Take 1 packet (4 g total) by mouth 3 (three) times daily. 60 each 12   clotrimazole-betamethasone (LOTRISONE) cream Apply 1 application topically 2 (two) times daily as needed. 30 g 1   diclofenac sodium (VOLTAREN) 1 % GEL Apply 4 g topically 4 (four) times daily as needed. 400 g 11   diphenhydrAMINE (BENADRYL) 25 MG tablet Take 25 mg by mouth every 6 (six) hours as needed (for bee stings).      fluticasone (FLONASE) 50 MCG/ACT nasal spray Place 2 sprays into both nostrils daily.     fulvestrant (FASLODEX) 250 MG/5ML injection Inject 250 mg into the muscle once. One injection each buttock over 1-2 minutes. Warm prior to use.     loratadine (CLARITIN) 10 MG tablet Take 10 mg  by mouth daily.     Multiple Vitamin (MULTIVITAMIN) capsule Take 1 capsule by mouth daily.     omeprazole (PRILOSEC) 20 MG capsule TAKE 1 CAPSULE(20 MG) BY MOUTH DAILY 90 capsule 1   palbociclib (IBRANCE) 100 MG tablet Take 1 tablet  (100 mg total) by mouth Monday through Friday for 3 weeks, then one week off, then repeat 15 tablet 6   polyethylene glycol (MIRALAX / GLYCOLAX) packet take 1 packet once daily if needed for constipation 30 packet 11   prochlorperazine (COMPAZINE) 10 MG tablet Take 1 tablet (10 mg total) by mouth every 6 (six) hours as needed for nausea or vomiting. 30 tablet 1   pseudoephedrine (SUDAFED) 30 MG tablet Take 30 mg by mouth every 4 (four) hours as needed for congestion.      telmisartan (MICARDIS) 40 MG tablet TAKE 1 TABLET(40 MG) BY MOUTH DAILY 90 tablet 1   Tiotropium Bromide Monohydrate (SPIRIVA RESPIMAT) 2.5 MCG/ACT AERS Inhale 2 puffs into the lungs daily. 4 g 11   triamterene-hydrochlorothiazide (DYAZIDE) 37.5-25 MG capsule Take 1 each (1 capsule total) by mouth in the morning. 90 capsule 4   zolpidem (AMBIEN) 10 MG tablet TAKE 1 TABLET(10 MG) BY MOUTH AT BEDTIME AS NEEDED 90 tablet 1   No current facility-administered medications for this visit.    OBJECTIVE: White woman who appears younger than stated Vitals:   08/29/20 1454  BP: (!) 138/57  Pulse: 78  Resp: 19  Temp: (!) 97.1 F (36.2 C)  SpO2: 100%     Body mass index is 26.43 kg/m.    GENERAL: Patient is a well appearing female in no acute distress HEENT:  Sclerae anicteric.  Oropharynx clear and moist. No ulcerations or evidence of oropharyngeal candidiasis. Neck is supple.  NODES:  No cervical, supraclavicular, or axillary lymphadenopathy palpated.  BREAST EXAM:  Deferred. LUNGS:  Clear to auscultation bilaterally.  No wheezes or rhonchi. HEART:  Regular rate and rhythm. No murmur appreciated. ABDOMEN:  Soft, nontender.  Positive, normoactive bowel sounds. No organomegaly palpated. MSK:  No focal spinal tenderness to palpation. Full range of motion bilaterally in the upper extremities. EXTREMITIES: + left arm swelling SKIN:  Clear with no obvious rashes or skin changes. No nail dyscrasia. NEURO:  Nonfocal. Well oriented.   Appropriate affect.    LAB RESULTS: Lab Results  Component Value Date   WBC 3.2 (L) 08/29/2020   NEUTROABS 2.0 08/29/2020   HGB 9.6 (L) 08/29/2020   HCT 28.2 (L) 08/29/2020   MCV 104.8 (H) 08/29/2020   PLT 167 08/29/2020      Chemistry      Component Value Date/Time   NA 138 08/01/2020 1403   NA 140 12/09/2015 1409   K 4.2 08/01/2020 1403   K 4.2 12/09/2015 1409   CL 107 08/01/2020 1403   CL 107 02/02/2012 0939   CO2 20 (L) 08/01/2020 1403   CO2 25 12/09/2015 1409   BUN 32 (H) 08/01/2020 1403   BUN 16.7 12/09/2015 1409   CREATININE 1.37 (H) 08/01/2020 1403   CREATININE 1.02 (H) 05/09/2019 1303   CREATININE 0.8 12/09/2015 1409      Component Value Date/Time   CALCIUM 9.5 08/01/2020 1403   CALCIUM 9.4 12/09/2015 1409   ALKPHOS 67 08/01/2020 1403   ALKPHOS 54 12/09/2015 1409   AST 19 08/01/2020 1403   AST 23 05/09/2019 1303   AST 20 12/09/2015 1409   ALT 11 08/01/2020 1403   ALT 13 05/09/2019 1303  ALT 13 12/09/2015 1409   BILITOT 0.3 08/01/2020 1403   BILITOT 0.4 05/09/2019 1303   BILITOT <0.22 12/09/2015 1409       Lab Results  Component Value Date   LABCA2 56 (H) 12/10/2010    No components found for: [id number]  No results for input(s): INR in the last 168 hours.  Urinalysis    Component Value Date/Time   COLORURINE YELLOW 07/13/2018 0938   APPEARANCEUR Sl Cloudy (A) 07/13/2018 0938   LABSPEC 1.020 07/13/2018 0938   PHURINE 6.0 07/13/2018 0938   GLUCOSEU NEGATIVE 07/13/2018 0938   HGBUR NEGATIVE 07/13/2018 0938   BILIRUBINUR NEGATIVE 07/13/2018 0938   KETONESUR NEGATIVE 07/13/2018 0938   UROBILINOGEN 0.2 07/13/2018 0938   NITRITE NEGATIVE 07/13/2018 0938   LEUKOCYTESUR TRACE (A) 07/13/2018 0938    STUDIES: No results found.     ASSESSMENT: 80 y.o. [place] woman:  LEFT BREAST #1  S/P LEFT breast lumpectomy with re-excision on 11/29/95 for a T2 N1bi stage IIb invasive ductal carcinoma , grade 2, estrogen receptor 98% positive,  progesterone receptor 97% positive, Ki67 8%.  #2 status post 4 cycles of doxorubicin and cyclophosphamide,    (i) followed by radiation therapy under the care of Dr. [name].  #3 received tamoxifen for a total of seven years   RIGHT BREAST #4  S/P biopsy of the RIGHT breast upper inner quadrant on 11/14/10 showing invasive ductal carcinoma,, grade 2, estrogen receptor 85% and progesterone receptor 57% positive, Ki67 20%, HER2 not amplified.    #5 started neoadjuvant letrozole in November 2012; switched to tamoxifen as of February 2016 due to osteopenia concerns  #6  S/P right lumpectomy with sentinel lymph node biopsy on 09/03/11 for a ypT2, ypN1a, stage IIB invasive lobular carcinoma, grade 2,estrogen receptor 97% positive, progesterone receptor 12% positive, with no HER-2 amplification.  #7 status post right breast radiation therapy under the care of Dr. [name] from 10/19/2011 to 12/03/2011.   #8 did not meet criteria for genetic testing according to her insurance company.  #9 osteopenia, with a T score of -1.8 on DEXA scan at [facility] 03/07/2013  (a) status post multiple dental extractions and implants  (b) repeat bone density at [facility] 04/02/2015 shows a T score of -2.0  METASTATIC DISEASE: April 2021 #10:  left upper extremity lymphedema led to chest CT scan 05/09/2019 showing a 5.1 cm left subpectoral chest wall mass and thoracic lymphadenopathy   (a) CT biopsy of the chest wall mass 05/25/2019 confirms recurrent breast cancer, strongly estrogen and progesterone receptor positive, HER-2 not amplified  (b) PET scan 05/30/2019 shows a left subpectoral mass measuring 5.4 cm, with significant regional and mediastinal adenopathy, sclerotic left scapular metastasis, but no liver or lung involvement.  (c) CA 27-29 is informative  #11 anastrozole started 06/02/2019; palbociclib added 06/08/2019  (a) palbociclib taken irregularly for several months secondary to cytopenias  (b), to start  palbociclib 100 mg daily 21 on 7 off October 2021  (c) CT of the chest with contrast 11/09/2019 shows mild disease progression (despite a continuing drop in the CA 27-29)  (d) fulvestrant added 11/23/2019  #12 denosumab/Xgeva starting 06/08/2019, to be repeated every 3 months due to hypocalcemia  #13 genetics testing 06/15/2019 through the [facility] Common Hereditary Cancers Panel found no deleterious mutations in APC, ATM, AXIN2, BARD1, BMPR1A, BRCA1, BRCA2, BRIP1, CDH1, CDKN2A (p14ARF), CDKN2A (p16INK4a), CKD4, CHEK2, CTNNA1, DICER1, EPCAM (Deletion/duplication testing only), GREM1 (promoter region deletion/duplication testing only), KIT, MEN1, MLH1, MSH2, MSH3, MSH6, MUTYH, NBN, NF1,  NHTL1, PALB2, PDGFRA, PMS2, POLD1, POLE, PTEN, RAD50, RAD51C, RAD51D, RNF43, SDHB, SDHC, SDHD, SMAD4, SMARCA4. STK11, TP53, TSC1, TSC2, and VHL.  The following genes were evaluated for sequence changes only: SDHA and HOXB13 c.251G>A variant only.   PLAN:    [place] continues on treatment with fulvestrant, anastrozole, Palbociclib, and Xgeva with good tolerance.  Her labs are stable today and I reviewed these with her in detail.  She will continue her treatment.    She is having increasing swelling in her left arm.  This is not getting better with PT, and considering that her tumor markers are starting to increase slightly and this is how her metastatic cancer had first presented, I am going to get a CT chest to rule out progression of her disease.    She is planning on getting in with ENT about her throat.    We will see her back in a couple of weeks after her scans to meet with Dr. Jana Hakim.  She knows to call for any questions that may arise between now and her next appointment.  We are happy to see her sooner if needed.   Total encounter time 30 minutes.* in chart review, lab review, face to face visit time, order entry, care coordination, and documentation of the encounter.    Wilber Bihari, NP 08/29/20 3:03  PM Medical Oncology and Hematology [facility] [place], [place] [address] Tel. [phone]    Fax. [phone]    *Total Encounter Time as defined by the Centers for Medicare and Medicaid Services includes, in addition to the face-to-face time of a patient visit (documented in the note above) non-face-to-face time: obtaining and reviewing outside history, ordering and reviewing medications, tests or procedures, care coordination (communications with other health care professionals or caregivers) and documentation in the medical record.

## 2020-08-30 LAB — CANCER ANTIGEN 27.29: CA 27.29: 47.2 U/mL — ABNORMAL HIGH (ref 0.0–38.6)

## 2020-09-01 ENCOUNTER — Encounter: Payer: Self-pay | Admitting: Oncology

## 2020-09-02 ENCOUNTER — Telehealth: Payer: Self-pay | Admitting: Oncology

## 2020-09-02 NOTE — Telephone Encounter (Signed)
Scheduled appt per 7/31 sch msg. Called pt, no answer. Left msg with appt date and time.

## 2020-09-04 ENCOUNTER — Other Ambulatory Visit: Payer: Self-pay | Admitting: Adult Health

## 2020-09-04 DIAGNOSIS — C50212 Malignant neoplasm of upper-inner quadrant of left female breast: Secondary | ICD-10-CM

## 2020-09-04 DIAGNOSIS — Z170 Estrogen receptor positive status [ER+]: Secondary | ICD-10-CM

## 2020-09-05 ENCOUNTER — Ambulatory Visit (INDEPENDENT_AMBULATORY_CARE_PROVIDER_SITE_OTHER): Payer: Medicare Other | Admitting: *Deleted

## 2020-09-05 ENCOUNTER — Encounter: Payer: Self-pay | Admitting: Oncology

## 2020-09-05 DIAGNOSIS — C50212 Malignant neoplasm of upper-inner quadrant of left female breast: Secondary | ICD-10-CM

## 2020-09-05 DIAGNOSIS — I10 Essential (primary) hypertension: Secondary | ICD-10-CM | POA: Diagnosis not present

## 2020-09-05 DIAGNOSIS — Z170 Estrogen receptor positive status [ER+]: Secondary | ICD-10-CM

## 2020-09-05 NOTE — Patient Instructions (Addendum)
Visit Information   Halah, it was nice talking with you today.   I look forward to talking to you again for an update on Wednesday, November 27, 2020 at 2:00 pm please be listening out for my call that day.  I will call as close to 2:00 pm as possible.   If you need to cancel or re-schedule our telephone visit, please call [phone] and one of our care guides will be happy to assist you.   I look forward to hearing about your progress.   Please don't hesitate to contact me if I can be of assistance to you before our next scheduled telephone appointment.   [name], RN, BSN, [facility] RN Care Coordination- [place] [phone]: direct office [phone]: mobile   PATIENT GOALS:   Goals Addressed             This Visit's Progress    Patient Self-Care Activities   On track    Timeframe:  Long-Range Goal Priority:  Medium Start Date:         8/0/4/22                    Expected End Date:       09/05/21                Patient will self administer medications as prescribed Patient will attend all scheduled provider appointments Patient will call pharmacy for medication refills Patient will call provider office for new concerns or questions       Heart-Healthy Eating Plan Heart-healthy meal planning includes: Eating less unhealthy fats. Eating more healthy fats. Making other changes in your diet. Talk with your doctor or a diet specialist (dietitian) to create an eating plan that is right for you. What is my plan? Your doctor may recommend an eating plan that includes: Total fat: ______% or less of total calories a day. Saturated fat: ______% or less of total calories a day. Cholesterol: less than _________mg a day. What are tips for following this plan? Cooking Avoid frying your food. Try to bake, boil, grill, or broil it instead. You can also reduce fat by: Removing the skin from poultry. Removing all visible fats from  meats. Steaming vegetables in water or broth. Meal planning  At meals, divide your plate into four equal parts: Fill one-half of your plate with vegetables and green salads. Fill one-fourth of your plate with whole grains. Fill one-fourth of your plate with lean protein foods. Eat 4-5 servings of vegetables per day. A serving of vegetables is: 1 cup of raw or cooked vegetables. 2 cups of raw leafy greens. Eat 4-5 servings of fruit per day. A serving of fruit is: 1 medium whole fruit.  cup of dried fruit.  cup of fresh, frozen, or canned fruit.  cup of 100% fruit juice. Eat more foods that have soluble fiber. These are apples, broccoli, carrots, beans, peas, and barley. Try to get 20-30 g of fiber per day. Eat 4-5 servings of nuts, legumes, and seeds per week: 1 serving of dried beans or legumes equals  cup after being cooked. 1 serving of nuts is  cup. 1 serving of seeds equals 1 tablespoon.  General information Eat more home-cooked food. Eat less restaurant, buffet, and fast food. Limit or avoid alcohol. Limit foods that are high in starch and sugar. Avoid fried foods. Lose weight if you are overweight. Keep track of how much salt (sodium) you  eat. This is important if you have high blood pressure. Ask your doctor to tell you more about this. Try to add vegetarian meals each week. Fats Choose healthy fats. These include olive oil and canola oil, flaxseeds, walnuts, almonds, and seeds. Eat more omega-3 fats. These include salmon, mackerel, sardines, tuna, flaxseed oil, and ground flaxseeds. Try to eat fish at least 2 times each week. Check food labels. Avoid foods with trans fats or high amounts of saturated fat. Limit saturated fats. These are often found in animal products, such as meats, butter, and cream. These are also found in plant foods, such as palm oil, palm kernel oil, and coconut oil. Avoid foods with partially hydrogenated oils in them. These have trans fats.  Examples are stick margarine, some tub margarines, cookies, crackers, and other baked goods. What foods can I eat? Fruits All fresh, canned (in natural juice), or frozen fruits. Vegetables Fresh or frozen vegetables (raw, steamed, roasted, or grilled). Green salads. Grains Most grains. Choose whole wheat and whole grains most of the time. Rice andpasta, including brown rice and pastas made with whole wheat. Meats and other proteins Lean, well-trimmed beef, veal, pork, and lamb. Chicken and [place] without skin. All fish and shellfish. Wild duck, rabbit, pheasant, and venison. Egg whites or low-cholesterol egg substitutes. Dried beans, peas, lentils, and tofu. Seedsand most nuts. Dairy Low-fat or nonfat cheeses, including ricotta and mozzarella. Skim or 1% milk that is liquid, powdered, or evaporated. Buttermilk that is made with low-fatmilk. Nonfat or low-fat yogurt. Fats and oils Non-hydrogenated (trans-free) margarines. Vegetable oils, including soybean, sesame, sunflower, olive, peanut, safflower, corn, canola, and cottonseed. Salad dressings or mayonnaisemade with a vegetable oil. Beverages Mineral water. Coffee and tea. Diet carbonated beverages. Sweets and desserts Sherbet, gelatin, and fruit ice. Small amounts of dark chocolate. Limit all sweets and desserts. Seasonings and condiments All seasonings and condiments. The items listed above may not be a complete list of foods and drinks you can eat. Contact a dietitian for more options. What foods should I avoid? Fruits Canned fruit in heavy syrup. Fruit in cream or butter sauce. Fried fruit. Limitcoconut. Vegetables Vegetables cooked in cheese, cream, or butter sauce. Fried vegetables. Grains Breads that are made with saturated or trans fats, oils, or whole milk. Croissants. Sweet rolls. Donuts. High-fat crackers,such as cheese crackers. Meats and other proteins Fatty meats, such as hot dogs, ribs, sausage, bacon, rib-eye roast or  steak. High-fat deli meats, such as salami and bologna. Caviar. Domestic duck andgoose. Organ meats, such as liver. Dairy Cream, sour cream, cream cheese, and creamed cottage cheese. Whole-milk cheeses. Whole or 2% milk that is liquid, evaporated, or condensed. Whole buttermilk. Cream sauce or high-fat cheese sauce. Yogurt that is made fromwhole milk. Fats and oils Meat fat, or shortening. Cocoa butter, hydrogenated oils, palm oil, coconut oil, palm kernel oil. Solid fats and shortenings, including bacon fat, salt pork, lard, and butter. Nondairy cream substitutes. Salad dressings with cheeseor sour cream. Beverages Regular sodas and juice drinks with added sugar. Sweets and desserts Frosting. Pudding. Cookies. Cakes. Pies. Milk chocolate or white chocolate.Buttered syrups. Full-fat ice cream or ice cream drinks. The items listed above may not be a complete list of foods and drinks to avoid. Contact a dietitian for more information. Summary Heart-healthy meal planning includes eating less unhealthy fats, eating more healthy fats, and making other changes in your diet. Eat a balanced diet. This includes fruits and vegetables, low-fat or nonfat dairy, lean protein, nuts and legumes, whole  grains, and heart-healthy oils and fats. This information is not intended to replace advice given to you by your health care provider. Make sure you discuss any questions you have with your healthcare provider. Document Revised: 03/25/2017 Document Reviewed: 02/26/2017 Elsevier Patient Education  2022 [employer].  Consent to CCM Services: Ms. Avellino was given information about Chronic Care Management services including:  CCM service includes personalized support from designated clinical staff supervised by her physician, including individualized plan of care and coordination with other care providers 24/7 contact phone numbers for assistance for urgent and routine care needs. Service will only be billed when  office clinical staff spend 20 minutes or more in a month to coordinate care. Only one practitioner may furnish and bill the service in a calendar month. The patient may stop CCM services at any time (effective at the end of the month) by phone call to the office staff. The patient will be responsible for cost sharing (co-pay) of up to 20% of the service fee (after annual deductible is met).  Patient agreed to services and verbal consent obtained.   Patient verbalizes understanding of instructions provided today and agrees to view in [place].  Telephone follow up appointment with care management team member scheduled for:  Wednesday, November 27, 2020 at 2:00 pm  The patient has been provided with contact information for the care management team and has been advised to call with any health related questions or concerns.   [name], RN, BSN, [facility] RN Care Coordination- [place] [phone]: direct office [phone]: mobile    CLINICAL CARE PLAN: Patient Care Plan: RN Care Manager Plan of Care     Problem Identified: Chronic disease management needs   Priority: Medium     Long-Range Goal: Development of plan of care for long term disease management   Start Date: 09/05/2020  Expected End Date: 09/05/2021  Priority: Medium  Note:   Current Barriers:  Chronic Disease Management support and education needs related to HTN and Cancer  RNCM Clinical Goal(s):  Patient will demonstrate ongoing adherence to prescribed treatment plan for HTN and Cancer as evidenced by adherence to prescribed medication regimen contacting provider for new or worsened symptoms or questions attending all provider appointments    Interventions: 1:1 collaboration with primary care provider regarding development and update of comprehensive plan of care as evidenced by provider attestation and co-signature Inter-disciplinary care team collaboration (see longitudinal plan of  care) Evaluation of current treatment plan related to  self management and patient's adherence to plan as established by provider;  Hypertension: (Status: New goal.) Last practice recorded BP readings:  BP Readings from Last 3 Encounters:  08/29/20 (!) 138/57  08/01/20 129/66  07/08/20 110/80  Most recent eGFR/CrCl:  Lab Results  Component Value Date   EGFR 69 (L) 12/09/2015    No components found for: CRCL  Evaluation of current treatment plan related to hypertension self management and patient's adherence to plan as established by provider;   Reviewed prescribed diet heart healthy, low salt, DASH diet Discussed plans with patient for ongoing care management follow up and provided patient with direct contact information for care management team; Reviewed scheduled/upcoming provider appointments including:  Screening for signs and symptoms of depression related to chronic disease state;  Assessed social determinant of health barriers;   Breast CA  (Status: New goal.) Evaluation of current treatment plan related to  cancer ,     self-management and patient's adherence  to plan as established by provider. Discussed plans with patient for ongoing care management follow up and provided patient with direct contact information for care management team Reviewed scheduled/upcoming provider appointments including CT scan 09/09/20; oncology provider 09/16/20; ENT provider 10/14/20; pulmonary provider 11/26/20; PCP 01/07/21; Discussed plan of care for ongoing treatment breast CA: reports (L) arm lymphedema has returned; confirmed oncology work up in progress  Patient Goals/Self-Care Activities: Patient will self administer medications as prescribed Patient will attend all scheduled provider appointments Patient will call pharmacy for medication refills Patient will call provider office for new concerns or questions

## 2020-09-05 NOTE — Chronic Care Management (AMB) (Signed)
Chronic Care Management   CCM RN Visit Note  09/05/2020 Name: Stacey [name] MRN: [id number] DOB: [birth year]  Subjective: Stacey [name] is a 80 y.o. year old female who is a primary care patient of Biagio Borg, MD. The care management team was consulted for assistance with disease management and care coordination needs.    Engaged with patient by telephone for initial visit in response to provider referral for case management and/or care coordination services.   Consent to Services:  The patient was given information about Chronic Care Management services, agreed to services, and gave verbal consent 08/15/20 prior to initiation of services.  Please see initial visit note for detailed documentation.  Patient agreed to services and verbal consent obtained.   Assessment: Review of patient past medical history, allergies, medications, health status, including review of consultants reports, laboratory and other test data, was performed as part of comprehensive evaluation and provision of chronic care management services.   SDOH (Social Determinants of Health) assessments and interventions performed:  SDOH Interventions    Flowsheet Row Most Recent Value  SDOH Interventions   Food Insecurity Interventions Intervention Not Indicated  Housing Interventions Intervention Not Indicated  Transportation Interventions Intervention Not Indicated  [drives self]      CCM Care Plan  Allergies  Allergen Reactions   Bee Venom Swelling   Clindamycin/Lincomycin Diarrhea and Nausea And Vomiting   Codeine Nausea And Vomiting   Outpatient Encounter Medications as of 09/05/2020  Medication Sig   anastrozole (ARIMIDEX) 1 MG tablet TAKE 1 TABLET(1 MG) BY MOUTH DAILY   aspirin 81 MG EC tablet Take 81 mg by mouth daily.   calcium-vitamin D (OSCAL WITH D) 500-200 MG-UNIT per tablet Take 1 tablet by mouth daily.   cholecalciferol (VITAMIN D) 1000 UNITS tablet Take 1,000 Units by mouth daily.    clotrimazole-betamethasone (LOTRISONE) cream Apply 1 application topically 2 (two) times daily as needed.   diclofenac sodium (VOLTAREN) 1 % GEL Apply 4 g topically 4 (four) times daily as needed.   diphenhydrAMINE (BENADRYL) 25 MG tablet Take 25 mg by mouth every 6 (six) hours as needed (for bee stings).    fluticasone (FLONASE) 50 MCG/ACT nasal spray Place 2 sprays into both nostrils daily.   fulvestrant (FASLODEX) 250 MG/5ML injection Inject 250 mg into the muscle once. One injection each buttock over 1-2 minutes. Warm prior to use.   loratadine (CLARITIN) 10 MG tablet Take 10 mg by mouth daily.   Multiple Vitamin (MULTIVITAMIN) capsule Take 1 capsule by mouth daily.   omeprazole (PRILOSEC) 20 MG capsule TAKE 1 CAPSULE(20 MG) BY MOUTH DAILY   palbociclib (IBRANCE) 100 MG tablet Take 1 tablet (100 mg total) by mouth Monday through Friday for 3 weeks, then one week off, then repeat   polyethylene glycol (MIRALAX / GLYCOLAX) packet take 1 packet once daily if needed for constipation   prochlorperazine (COMPAZINE) 10 MG tablet Take 1 tablet (10 mg total) by mouth every 6 (six) hours as needed for nausea or vomiting.   pseudoephedrine (SUDAFED) 30 MG tablet Take 30 mg by mouth every 4 (four) hours as needed for congestion.    telmisartan (MICARDIS) 40 MG tablet TAKE 1 TABLET(40 MG) BY MOUTH DAILY   Tiotropium Bromide Monohydrate (SPIRIVA RESPIMAT) 2.5 MCG/ACT AERS Inhale 2 puffs into the lungs daily.   triamterene-hydrochlorothiazide (DYAZIDE) 37.5-25 MG capsule Take 1 each (1 capsule total) by mouth in the morning.   zolpidem (AMBIEN) 10 MG tablet TAKE 1 TABLET(10 MG) BY MOUTH  AT BEDTIME AS NEEDED   [DISCONTINUED] cholestyramine (QUESTRAN) 4 g packet Take 1 packet (4 g total) by mouth 3 (three) times daily.   No facility-administered encounter medications on file as of 09/05/2020.   Patient Active Problem List   Diagnosis Date Noted   Allergic rhinitis 07/08/2020   Hypomagnesemia 06/10/2020    Goals of care, counseling/discussion 06/21/2019   Family history of breast cancer    Family history of pancreatic cancer    Family history of uterine cancer    Family history of lymphoma    Family history of lung cancer    Bone metastases ([place]) 06/02/2019   Recurrent cancer of left breast ([place]) 05/15/2019   Pes anserine bursitis 03/31/2019   HTN (hypertension) 02/10/2019   Hyperglycemia 06/21/2018   Left carpal tunnel syndrome 02/22/2018   Rotator cuff arthropathy of left shoulder 09/20/2017   Arthritis of hand 08/23/2017   Pain in right hand 08/23/2017   Cervical radiculitis 04/09/2017   Left shoulder pain 04/09/2017   Dyspnea on exertion 05/14/2016   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   Lymphedema of left arm 01/14/2016   Left arm swelling 12/25/2015   SVT (supraventricular tachycardia) ([place])    Left-sided carotid artery disease ([place]) 03/08/2015   Dizziness 01/24/2015   Urinary frequency 01/13/2015   Dizziness and giddiness 01/09/2015   Gallstones 02/21/2013   Malignant neoplasm of upper-inner quadrant of left breast in female, estrogen receptor positive ([place]) 11/28/2012   Muscle cramping 09/12/2012   Right hip pain 09/12/2012   Lower back pain 09/12/2012   COPD GOLD I     Hx of radiation therapy    Right shoulder pain 09/14/2011   Preventative health care 07/29/2010   Skin lesion of left leg 07/29/2010   Paresthesia 07/29/2010   GERD 03/28/2010   CONSTIPATION 03/28/2010   Osteoarthrosis, 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   Anxiety state 05/03/2009   Hyperlipidemia 06/14/2007   FATIGUE 06/14/2007   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   DIVERTICULOSIS, COLON 12/17/2006   Cough 12/17/2006   IRRITABLE BOWEL SYNDROME, HX OF 12/17/2006   Conditions to be addressed/monitored:  HTN and Breast CA  Care Plan : RN Care Manager Plan of Care  Updates made by Knox Royalty, RN since 09/05/2020 12:00 AM     Problem: Chronic disease management needs    Priority: Medium     Long-Range Goal: Development of plan of care for long term disease management   Start Date: 09/05/2020  Expected End Date: 09/05/2021  Priority: Medium  Note:   Current Barriers:  Chronic Disease Management support and education needs related to HTN and Cancer  RNCM Clinical Goal(s):  Patient will demonstrate ongoing adherence to prescribed treatment plan for HTN and Cancer as evidenced by adherence to prescribed medication regimen contacting provider for new or worsened symptoms or questions attending all provider appointments    Interventions: 1:1 collaboration with primary care provider regarding development and update of comprehensive plan of care as evidenced by provider attestation and co-signature Inter-disciplinary care team collaboration (see longitudinal plan of care) Evaluation of current treatment plan related to  self management and patient's adherence to plan as established by provider;  Hypertension: (Status: New goal.) Last practice recorded BP readings:  BP Readings from Last 3 Encounters:  08/29/20 (!) 138/57  08/01/20 129/66  07/08/20 110/80  Most recent eGFR/CrCl:  Lab Results  Component Value Date   EGFR 69 (L) 12/09/2015    No components found for: CRCL  Evaluation of current treatment plan related to hypertension self management and patient's adherence to plan as established by provider;   Reviewed prescribed diet heart healthy, low salt, DASH diet Discussed plans with patient for ongoing care management follow up and provided patient with direct contact information for care management team; Reviewed scheduled/upcoming provider appointments including:  Screening for signs and symptoms of depression related to chronic disease state;  Assessed social determinant of health barriers;   Breast CA  (Status: New goal.) Evaluation of current treatment plan related to  cancer ,     self-management and patient's adherence to plan as established  by provider. Discussed plans with patient for ongoing care management follow up and provided patient with direct contact information for care management team Reviewed scheduled/upcoming provider appointments including CT scan 09/09/20; oncology provider 09/16/20; ENT provider 10/14/20; pulmonary provider 11/26/20; PCP 01/07/21; Discussed plan of care for ongoing treatment breast CA: reports (L) arm lymphedema has returned; confirmed oncology work up in progress  Patient Goals/Self-Care Activities: Patient will self administer medications as prescribed Patient will attend all scheduled provider appointments Patient will call pharmacy for medication refills Patient will call provider office for new concerns or questions      Plan: Telephone follow up appointment with care management team member scheduled for: Wednesday November 27, 2020 at 2:00 pm  The patient has been provided with contact information for the care management team and has been advised to call with any health related questions or concerns.   [name], RN, BSN, [facility] RN Care Coordination- [place] [phone]: direct office [phone]: mobile

## 2020-09-09 ENCOUNTER — Encounter (HOSPITAL_COMMUNITY): Payer: Self-pay

## 2020-09-09 ENCOUNTER — Other Ambulatory Visit: Payer: Self-pay

## 2020-09-09 ENCOUNTER — Ambulatory Visit (HOSPITAL_COMMUNITY)
Admission: RE | Admit: 2020-09-09 | Discharge: 2020-09-09 | Disposition: A | Discharge status: Home / Self Care | Payer: Medicare Other | Source: Ambulatory Visit | Attending: Adult Health | Admitting: Adult Health

## 2020-09-09 DIAGNOSIS — Z170 Estrogen receptor positive status [ER+]: Secondary | ICD-10-CM | POA: Insufficient documentation

## 2020-09-09 DIAGNOSIS — I700 Atherosclerosis of aorta: Secondary | ICD-10-CM | POA: Diagnosis not present

## 2020-09-09 DIAGNOSIS — R911 Solitary pulmonary nodule: Secondary | ICD-10-CM | POA: Diagnosis not present

## 2020-09-09 DIAGNOSIS — C7951 Secondary malignant neoplasm of bone: Secondary | ICD-10-CM | POA: Insufficient documentation

## 2020-09-09 DIAGNOSIS — J439 Emphysema, unspecified: Secondary | ICD-10-CM | POA: Diagnosis not present

## 2020-09-09 DIAGNOSIS — C50212 Malignant neoplasm of upper-inner quadrant of left female breast: Secondary | ICD-10-CM | POA: Diagnosis not present

## 2020-09-09 MED ORDER — IOHEXOL 350 MG/ML SOLN
60.0000 mL | Freq: Once | INTRAVENOUS | Status: AC | PRN
  Administered 2020-09-09: 60 mL via INTRAVENOUS

## 2020-09-12 ENCOUNTER — Ambulatory Visit: Payer: Medicare Other | Admitting: Oncology

## 2020-09-15 NOTE — Progress Notes (Addendum)
ID: Stacey [name]   DOB: 19-Apr-1940  MR#: [id number]  OZH#:[id number]  Patient Care Team: Biagio Borg, MD as PCP - General (Internal Medicine) Tyler Pita, MD as Consulting Physician (Radiation Oncology) Roseanne Kaufman, MD as Consulting Physician (Orthopedic Surgery) Lyndal Pulley, DO as Consulting Physician (Family Medicine) Dorothea Yow, Virgie Dad, MD as Consulting Physician (Oncology) Monna Fam, MD as Consulting Physician (Ophthalmology) Knox Royalty, RN as Case Manager Raina Mina, RPH-CPP (Pharmacist) OTHER MD:   CHIEF COMPLAINT: metastatic breast cancer  CURRENT TREATMENT: anastrozole; palbociclib/Ibrance; denosumab/Xgeva every 12 weeks; fulvestrant   INTERVAL HISTORY:   Stacey [name] returns today for follow up of her metastatic breast cancer.  She just had a CT of the chest to evaluate her for disease progression given her increased lymphedema.  Fortunately this shows completely stable disease.  She continues on anastrozole 06/02/2019.  She is tolerating this well so far.  She continues on palbociclib, currently at 161m Monday through Friday (not Saturday or Sunday); three weeks on and one week off. She is having no side effects from this that she is aware of and we have not had to make any further dose reductions..  She also receives fulvestrant, every 4 weeks.  She tolerates the injections without any difficulty.  She receives denosumab/Xgeva every 12 weeks, most recently on 06/06/2020.  She has very poor dentition, has had approximately 10 prior implants, and has 2 teeth currently that need either to be extracted or to undergo root canals.  She met with her dental surgeon and he suggested avoiding extractions given the fact that she has been on Xgeva and that could significantly compromise bone healing and precipitate osteonecrosis.  She is going to have root canals instead and hopefully that will work for her.  We are following her CA 27-29:  Lab Results  Component  Value Date   CA2729 47.2 (H) 08/29/2020   CA2729 50.8 (H) 08/01/2020   CA2729 51.3 (H) 07/04/2020   CA2729 43.4 (H) 06/06/2020   CA2729 37.8 05/09/2020    REVIEW OF SYSTEMS: GEricis normally active.  She has a big wedding coming up in November and she is already feeling exhausted preparing for it.  She enjoys reading.  She is not exercising on a regular basis.  She wonders if she needs a new lymphedema sleeve since hers really is left over from her prior experiences with breast cancer.  Aside from these issues a detailed review of systems today was stable   COVID 19 VACCINATION STATUS: Fully vaccinated with booster in September 2021   HISTORY OF PRESENT ILLNESS:  From the original intake note:  " Stacey [name] a 80year old [name], who has a history of breast cancer starting in 1997 and in 2013.    On 11/22/95, she underwent a left breast biopsy, which revealed malignant cells.  She had a left breast lumpectomy with re-excision on 11/29/95, with final pathology revealed IDC, grade 2, 2.5 cm, ER 98%, PR 97%, Ki67 8% with one of 15 nodes being positive.  She received chemotherapy, 4 cycles per patient, along with radiation and took Tamoxifen for 7 years following radiation completion.  Records from 1997 limited.    In October 2012, she had a biopsy of the right breast after mammography recommended additional images for a dense area in the right breast.  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  On 11/25/10, she had an MRI that measured the area in the right breast as 2.2 cm.  She started neoadjuvant [name] in November 2012 and has continued it since.  The area of concern measured 1.7 cm on a repeat MRI in March of 2013.  On 09/03/11, she underwent a right lumpectomy with sentinel lymph node biopsy with final pathology resulting invasive lobular carcinoma with  calcifications, grade 2, 2.5 cm wit lobular carcinoma in situ.  Surgical margins were negative with one of one sentinel lymph node positive for metastatic carcinoma.  She then underwent radiation therapy under the care of Dr. [name] from 10/19/11 to 12/03/11. "   Her subsequent history is as detailed below   PAST MEDICAL HISTORY: Past Medical History:  Diagnosis Date   ANEMIA-NOS    ANXIETY    Breast cancer ([place]) 1997 L, 2012 R   s/p chemo/xrt   COPD    resolved   DIVERTICULOSIS, COLON 2008   Dizziness    Family history of breast cancer    Family history of lung cancer    Family history of lymphoma    Family history of pancreatic cancer    Family history of uterine cancer    GERD    Hx of radiation therapy 10/19/11 -12/03/11   right breast   HYPERLIPIDEMIA    IRRITABLE BOWEL SYNDROME, HX OF    Left-sided carotid artery disease (HCC)    moderate left ICA stenosis   OSTEOARTHRITIS, HAND    PSVT (paroxysmal supraventricular tachycardia) ([place])    symptomatic on event monitor    PAST SURGICAL HISTORY: Past Surgical History:  Procedure Laterality Date   ABDOMINAL HYSTERECTOMY     APPENDECTOMY     BREAST BIOPSY  11/14/10    r breast: inv, insitu mammary carcinoma w/calcif, er/pr +, her2 -   BREAST SURGERY     lumpectomy   CATARACT EXTRACTION     both eyes   ELECTROPHYSIOLOGIC STUDY N/A 05/10/2015   Procedure: SVT Ablation;  Surgeon: Will Meredith Leeds, MD;  Location: [facility];  Service: Cardiovascular;  Laterality: N/A;   HERNIA REPAIR     inguinal herniorrhapy  1984   left   IR US GUIDE BX ASP/DRAIN  05/26/2019   rectal fissure repair     s/p benign breast biopsy  2003   right   s/p left foot surgury  2009   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   spiral fx left foot  2008   no surgury   TMJ ARTHROPLASTY  1989   TONGUE SURGERY     1988- to remove scar tissue growth    TONSILLECTOMY      FAMILY HISTORY Family History  Problem Relation Age of  Onset   Hypertension Mother    Stroke Mother    Colon polyps Mother    Diabetes Mother    Pancreatic cancer Mother 50   Uterine cancer Mother 30   Lymphoma Brother        burkitts   Lung cancer Paternal Uncle    Lung cancer Maternal Grandmother 50       non-smoker   Lung cancer Maternal Grandfather    Breast cancer Cousin        maternal cousin, dx in her mid 76s   Brain cancer Cousin        maternal cousin's son; dx in his 46s   Testicular cancer Cousin  maternal cousin's son;    Breast cancer Cousin        paternal cousin; dx in her 27s   Breast cancer Cousin        paternal cousin's daughter; dx in 38s; neg genetic testing   Colon cancer Neg Hx    Esophageal cancer Neg Hx    Stomach cancer Neg Hx    Rectal cancer Neg Hx     GYNECOLOGIC HISTORY:   Menarche age 53, GX P0 (one miscarriage at 49 months). Status post vaginal hysterectomy in the late 1970s, no salpingo-oophorectomy    SOCIAL HISTORY:   The patient lives alone and is divorced. She retired from [employer], were she worked in Physiological scientist.   ADVANCED DIRECTIVES:  Living will in place; the patient's healthcare power of attorney is her stepdaughter, Vladimir Faster, who can be reached at [place]: Social History   Tobacco Use   Smoking status: Former    Packs/day: 1.00    Years: 54.00    Pack years: 54.00    Types: Cigarettes    Quit date: 01/03/2016    Years since quitting: 4.7   Smokeless tobacco: Never  Vaping Use   Vaping Use: Never used  Substance Use Topics   Alcohol use: Yes    Comment: rare/ drinks socially   Drug use: No    Allergies  Allergen Reactions   Bee Venom Swelling   Clindamycin/Lincomycin Diarrhea and Nausea And Vomiting   Codeine Nausea And Vomiting    Current Outpatient Medications  Medication Sig Dispense Refill   anastrozole (ARIMIDEX) 1 MG tablet TAKE 1 TABLET(1 MG) BY MOUTH DAILY 90 tablet 4   aspirin 81 MG EC tablet Take 81 mg by mouth  daily.     calcium-vitamin D (OSCAL WITH D) 500-200 MG-UNIT per tablet Take 1 tablet by mouth daily.     cholecalciferol (VITAMIN D) 1000 UNITS tablet Take 1,000 Units by mouth daily.     cholestyramine (QUESTRAN) 4 g packet MIX AND DRINK 1 PACKET(4 GRAMS) BY MOUTH THREE TIMES DAILY 60 each 12   clotrimazole-betamethasone (LOTRISONE) cream Apply 1 application topically 2 (two) times daily as needed. 30 g 1   diclofenac sodium (VOLTAREN) 1 % GEL Apply 4 g topically 4 (four) times daily as needed. 400 g 11   diphenhydrAMINE (BENADRYL) 25 MG tablet Take 25 mg by mouth every 6 (six) hours as needed (for bee stings).      fluticasone (FLONASE) 50 MCG/ACT nasal spray Place 2 sprays into both nostrils daily.     fulvestrant (FASLODEX) 250 MG/5ML injection Inject 250 mg into the muscle once. One injection each buttock over 1-2 minutes. Warm prior to use.     loratadine (CLARITIN) 10 MG tablet Take 10 mg by mouth daily.     Multiple Vitamin (MULTIVITAMIN) capsule Take 1 capsule by mouth daily.     omeprazole (PRILOSEC) 20 MG capsule TAKE 1 CAPSULE(20 MG) BY MOUTH DAILY 90 capsule 1   palbociclib (IBRANCE) 100 MG tablet Take 1 tablet (100 mg total) by mouth Monday through Friday for 3 weeks, then one week off, then repeat 15 tablet 6   polyethylene glycol (MIRALAX / GLYCOLAX) packet take 1 packet once daily if needed for constipation 30 packet 11   prochlorperazine (COMPAZINE) 10 MG tablet Take 1 tablet (10 mg total) by mouth every 6 (six) hours as needed for nausea or vomiting. 30 tablet 1   pseudoephedrine (SUDAFED) 30 MG tablet Take 30 mg by  mouth every 4 (four) hours as needed for congestion.      telmisartan (MICARDIS) 40 MG tablet TAKE 1 TABLET(40 MG) BY MOUTH DAILY 90 tablet 1   Tiotropium Bromide Monohydrate (SPIRIVA RESPIMAT) 2.5 MCG/ACT AERS Inhale 2 puffs into the lungs daily. 4 g 11   triamterene-hydrochlorothiazide (DYAZIDE) 37.5-25 MG capsule Take 1 each (1 capsule total) by mouth in the  morning. 90 capsule 4   zolpidem (AMBIEN) 10 MG tablet TAKE 1 TABLET(10 MG) BY MOUTH AT BEDTIME AS NEEDED 90 tablet 1   No current facility-administered medications for this visit.    OBJECTIVE: White woman who appears younger than stated Vitals:   09/16/20 1554  BP: 139/62  Pulse: 88  Resp: 20  Temp: 97.7 F (36.5 C)  SpO2: 97%     Body mass index is 26.43 kg/m.     Sclerae unicteric, EOMs intact Wearing a mask No cervical or supraclavicular adenopathy Lungs no rales or rhonchi Heart regular rate and rhythm Abd soft, nontender, positive bowel sounds MSK no focal spinal tenderness, no upper extremity lymphedema Neuro: nonfocal, well oriented, appropriate affect Breasts: The right breast is status post remote lumpectomy and radiation.  There is no evidence of local recurrence.  The left breast is benign.  Both axillae are benign   LAB RESULTS: Lab Results  Component Value Date   WBC 3.9 (L) 09/16/2020   NEUTROABS 2.9 09/16/2020   HGB 10.0 (L) 09/16/2020   HCT 29.2 (L) 09/16/2020   MCV 105.4 (H) 09/16/2020   PLT 288 09/16/2020      Chemistry      Component Value Date/Time   NA 142 09/16/2020 1528   NA 140 12/09/2015 1409   K 4.0 09/16/2020 1528   K 4.2 12/09/2015 1409   CL 111 09/16/2020 1528   CL 107 02/02/2012 0939   CO2 19 (L) 09/16/2020 1528   CO2 25 12/09/2015 1409   BUN 19 09/16/2020 1528   BUN 16.7 12/09/2015 1409   CREATININE 1.15 (H) 09/16/2020 1528   CREATININE 1.02 (H) 05/09/2019 1303   CREATININE 0.8 12/09/2015 1409      Component Value Date/Time   CALCIUM 9.1 09/16/2020 1528   CALCIUM 9.4 12/09/2015 1409   ALKPHOS 66 09/16/2020 1528   ALKPHOS 54 12/09/2015 1409   AST 19 09/16/2020 1528   AST 23 05/09/2019 1303   AST 20 12/09/2015 1409   ALT 14 09/16/2020 1528   ALT 13 05/09/2019 1303   ALT 13 12/09/2015 1409   BILITOT 0.3 09/16/2020 1528   BILITOT 0.4 05/09/2019 1303   BILITOT <0.22 12/09/2015 1409       Lab Results  Component  Value Date   LABCA2 56 (H) 12/10/2010    No components found for: [id number]  No results for input(s): INR in the last 168 hours.  Urinalysis    Component Value Date/Time   COLORURINE YELLOW 07/13/2018 0938   APPEARANCEUR [place] (A) 07/13/2018 0938   LABSPEC 1.020 07/13/2018 0938   PHURINE 6.0 07/13/2018 0938   GLUCOSEU NEGATIVE 07/13/2018 0938   HGBUR NEGATIVE 07/13/2018 0938   BILIRUBINUR NEGATIVE 07/13/2018 0938   KETONESUR NEGATIVE 07/13/2018 0938   UROBILINOGEN 0.2 07/13/2018 0938   NITRITE NEGATIVE 07/13/2018 0938   LEUKOCYTESUR TRACE (A) 07/13/2018 0938    STUDIES: CT Chest W Contrast  Result Date: 09/10/2020 CLINICAL DATA:  Breast cancer restaging. EXAM: CT CHEST WITH CONTRAST TECHNIQUE: Multidetector CT imaging of the chest was performed during intravenous contrast administration. CONTRAST:  25m  OMNIPAQUE IOHEXOL 350 MG/ML SOLN COMPARISON:  Multiple prior imaging studies. The most recent is a chest CT from 05/30/2020 FINDINGS: Cardiovascular: The heart is normal in size. No pericardial effusion. Stable tortuosity and calcification of the thoracic aorta. No dissection or focal aneurysm. Stable coronary artery calcifications. Mediastinum/Nodes: Stable scattered sub 8 mm mediastinal lymph nodes. No new or progressive findings. 9 mm right hilar node is unchanged. Stable large hiatal hernia. Lungs/Pleura: Stable radiation changes involving the anterior aspect of the right lung. There are underlying emphysematous changes with biapical pleural and parenchymal scarring. Patchy E tree-in-bud type opacities are again demonstrated in the left upper lobe, right middle lobe and right lower lobe suggesting chronic inflammation or atypical infection such as MAC. I do not see any worrisome pulmonary nodules to suggest metastatic disease. Upper Abdomen: No significant upper abdominal findings. Stable low-attenuation liver lesions. Stable adrenal gland nodules. Stable vascular calcifications.  Musculoskeletal: Stable surgical changes involving the right breast with a remote postop fluid collection. Stable skin thickening involving the left breast and surgical clips. Stable ill-defined soft tissue density in the left axilla surrounding the brachial plexus structures. No new or progressive findings. Nearby surgical clips. No lytic or sclerotic bone lesions. IMPRESSION: 1. Stable CT appearance of the chest. No new or progressive findings are demonstrated. 2. Stable underlying emphysematous changes and pulmonary scarring. 3. Persistent patchy tree-in-bud opacities suggesting chronic inflammation or atypical infection such as MAC. 4. Stable ill-defined soft tissue mass surrounding the left brachial praxis less structures. 5. Stable large hiatal hernia. Aortic Atherosclerosis (ICD10-I70.0) and Emphysema (ICD10-J43.9). Electronically Signed   By: Marijo Sanes M.D.   On: 09/10/2020 14:25       ASSESSMENT: 80 y.o. [place] woman:  LEFT BREAST #1  S/P LEFT breast lumpectomy with re-excision on 11/29/95 for a T2 N1bi stage IIb invasive ductal carcinoma , grade 2, estrogen receptor 98% positive, progesterone receptor 97% positive, Ki67 8%.  #2 status post 4 cycles of doxorubicin and cyclophosphamide,    (i) followed by radiation therapy under the care of Dr. [name].  #3 received tamoxifen for a total of seven years   RIGHT BREAST #4  S/P biopsy of the RIGHT breast upper inner quadrant on 11/14/10 showing invasive ductal carcinoma,, grade 2, estrogen receptor 85% and progesterone receptor 57% positive, Ki67 20%, HER2 not amplified.    #5 started neoadjuvant letrozole in November 2012; switched to tamoxifen as of February 2016 due to osteopenia concerns  #6  S/P right lumpectomy with sentinel lymph node biopsy on 09/03/11 for a ypT2, ypN1a, stage IIB invasive lobular carcinoma, grade 2,estrogen receptor 97% positive, progesterone receptor 12% positive, with no HER-2 amplification.  #7 status  post right breast radiation therapy under the care of Dr. [name] from 10/19/2011 to 12/03/2011.   #8 did not meet criteria for genetic testing according to her insurance company.  #9 osteopenia, with a T score of -1.8 on DEXA scan [facility] 03/07/2013  (a) status post multiple dental extractions and implants  (b) repeat [facility] density at [facility] 04/02/2015 shows a T score of -2.0  METASTATIC DISEASE: April 2021 (lymph nodes, bone) #10:  left upper extremity lymphedema led to chest CT scan 05/09/2019 showing a 5.1 cm left subpectoral chest wall mass and thoracic lymphadenopathy   (a) CT biopsy of the chest wall mass 05/25/2019 confirms recurrent breast cancer, strongly estrogen and progesterone receptor positive, HER-2 not amplified  (b) PET scan 05/30/2019 shows a left subpectoral mass measuring 5.4 cm, with significant regional and  mediastinal adenopathy, sclerotic left scapular metastasis, but no liver or lung involvement.  (c) CA 27-29 is moderately informative  #11 anastrozole started 06/02/2019; palbociclib added 06/08/2019  (a) palbociclib taken irregularly for several months secondary to cytopenias  (b) palbociclib dose decreased to 100 mg daily 21 on 7 off October 2021  (c) CT of the chest with contrast 11/09/2019 shows mild disease progression (despite a continuing drop in the CA 27-29)  (d) fulvestrant added 11/23/2019  (e) repeat chest CT with contrast 09/09/2020 read as stable.  #12 denosumab/Xgeva starting 06/08/2019, to be repeated every 3 months due to hypocalcemia  (a) treatment held briefly after May 2022 dose secondary to dental issues  #13 genetics testing 06/15/2019 through the [facility] Common Hereditary Cancers Panel found no deleterious mutations in APC, ATM, AXIN2, BARD1, BMPR1A, BRCA1, BRCA2, BRIP1, CDH1, CDKN2A (p14ARF), CDKN2A (p16INK4a), CKD4, CHEK2, CTNNA1, DICER1, EPCAM (Deletion/duplication testing only), GREM1 (promoter region deletion/duplication testing only), KIT,  MEN1, MLH1, MSH2, MSH3, MSH6, MUTYH, NBN, NF1, NHTL1, PALB2, PDGFRA, PMS2, POLD1, POLE, PTEN, RAD50, RAD51C, RAD51D, RNF43, SDHB, SDHC, SDHD, SMAD4, SMARCA4. STK11, TP53, TSC1, TSC2, and VHL.  The following genes were evaluated for sequence changes only: SDHA and HOXB13 c.251G>A variant only.   PLAN:   Liliani is now a little over a year from definitive diagnosis of metastatic disease.  Her disease is very well controlled and she has few symptoms related to it aside from the left upper extremity lymphedema.  She is also tolerating her treatment well.  She does have some diarrhea from the [place] and that is the reason she does not like to leave her home untill noon.  She also has some fatigue and she worries because she has not big wedding coming up in November.  Finally she has chronic dental issues which are complicated by the fact that she is receiving denosumab.  The plan is to avoid extractions but continue with the denosumab and I have rescheduled that for her to be resumed with her next Faslodex treatment  I wrote her a prescription for left upper extremity compression sleeves.  She will then see me in approximately 2 months.  She knows to call for any other issue that may develop before then  Total encounter time 30 minutes.[name] C. Raahi Korber, MD 09/16/20 5:40 PM Medical Oncology and Hematology [facility] [place], [place] [address] Tel. [phone]    Fax. [phone]   I, Wilburn Mylar, am acting as scribe for Dr. Virgie Dad. Mallika Sanmiguel.  I, Lurline Del MD, have reviewed the above documentation for accuracy and completeness, and I agree with the above.    *Total Encounter Time as defined by the Centers for Medicare and Medicaid Services includes, in addition to the face-to-face time of a patient visit (documented in the note above) non-face-to-face time: obtaining and reviewing outside history, ordering and reviewing medications, tests or  procedures, care coordination (communications with other health care professionals or caregivers) and documentation in the medical record.

## 2020-09-16 ENCOUNTER — Other Ambulatory Visit: Payer: Self-pay

## 2020-09-16 ENCOUNTER — Inpatient Hospital Stay: Payer: Medicare Other

## 2020-09-16 ENCOUNTER — Inpatient Hospital Stay: Payer: Medicare Other | Attending: Adult Health | Admitting: Oncology

## 2020-09-16 VITALS — BP 139/62 | HR 88 | Temp 97.7°F | Resp 20 | Ht 63.0 in | Wt 149.2 lb

## 2020-09-16 DIAGNOSIS — Z801 Family history of malignant neoplasm of trachea, bronchus and lung: Secondary | ICD-10-CM | POA: Diagnosis not present

## 2020-09-16 DIAGNOSIS — C50912 Malignant neoplasm of unspecified site of left female breast: Secondary | ICD-10-CM

## 2020-09-16 DIAGNOSIS — I471 Supraventricular tachycardia: Secondary | ICD-10-CM | POA: Diagnosis not present

## 2020-09-16 DIAGNOSIS — Z170 Estrogen receptor positive status [ER+]: Secondary | ICD-10-CM

## 2020-09-16 DIAGNOSIS — M1990 Unspecified osteoarthritis, unspecified site: Secondary | ICD-10-CM | POA: Insufficient documentation

## 2020-09-16 DIAGNOSIS — K219 Gastro-esophageal reflux disease without esophagitis: Secondary | ICD-10-CM | POA: Insufficient documentation

## 2020-09-16 DIAGNOSIS — C50211 Malignant neoplasm of upper-inner quadrant of right female breast: Secondary | ICD-10-CM | POA: Diagnosis not present

## 2020-09-16 DIAGNOSIS — Z800 Family history of malignant neoplasm of digestive organs: Secondary | ICD-10-CM | POA: Diagnosis not present

## 2020-09-16 DIAGNOSIS — Z923 Personal history of irradiation: Secondary | ICD-10-CM | POA: Diagnosis not present

## 2020-09-16 DIAGNOSIS — Z79899 Other long term (current) drug therapy: Secondary | ICD-10-CM | POA: Insufficient documentation

## 2020-09-16 DIAGNOSIS — M8580 Other specified disorders of bone density and structure, unspecified site: Secondary | ICD-10-CM | POA: Insufficient documentation

## 2020-09-16 DIAGNOSIS — Z806 Family history of leukemia: Secondary | ICD-10-CM | POA: Insufficient documentation

## 2020-09-16 DIAGNOSIS — Z803 Family history of malignant neoplasm of breast: Secondary | ICD-10-CM | POA: Diagnosis not present

## 2020-09-16 DIAGNOSIS — C773 Secondary and unspecified malignant neoplasm of axilla and upper limb lymph nodes: Secondary | ICD-10-CM | POA: Insufficient documentation

## 2020-09-16 DIAGNOSIS — Z7982 Long term (current) use of aspirin: Secondary | ICD-10-CM | POA: Insufficient documentation

## 2020-09-16 DIAGNOSIS — Z87891 Personal history of nicotine dependence: Secondary | ICD-10-CM | POA: Diagnosis not present

## 2020-09-16 DIAGNOSIS — C7951 Secondary malignant neoplasm of bone: Secondary | ICD-10-CM

## 2020-09-16 DIAGNOSIS — E785 Hyperlipidemia, unspecified: Secondary | ICD-10-CM | POA: Diagnosis not present

## 2020-09-16 DIAGNOSIS — C50212 Malignant neoplasm of upper-inner quadrant of left female breast: Secondary | ICD-10-CM

## 2020-09-16 DIAGNOSIS — D649 Anemia, unspecified: Secondary | ICD-10-CM | POA: Diagnosis not present

## 2020-09-16 DIAGNOSIS — R9389 Abnormal findings on diagnostic imaging of other specified body structures: Secondary | ICD-10-CM

## 2020-09-16 DIAGNOSIS — Z79818 Long term (current) use of other agents affecting estrogen receptors and estrogen levels: Secondary | ICD-10-CM | POA: Diagnosis not present

## 2020-09-16 DIAGNOSIS — C50911 Malignant neoplasm of unspecified site of right female breast: Secondary | ICD-10-CM

## 2020-09-16 LAB — CBC WITH DIFFERENTIAL/PLATELET
Abs Immature Granulocytes: 0.02 10*3/uL (ref 0.00–0.07)
Basophils Absolute: 0.1 10*3/uL (ref 0.0–0.1)
Basophils Relative: 1 %
Eosinophils Absolute: 0 10*3/uL (ref 0.0–0.5)
Eosinophils Relative: 1 %
HCT: 29.2 % — ABNORMAL LOW (ref 36.0–46.0)
Hemoglobin: 10 g/dL — ABNORMAL LOW (ref 12.0–15.0)
Immature Granulocytes: 1 %
Lymphocytes Relative: 16 %
Lymphs Abs: 0.6 10*3/uL — ABNORMAL LOW (ref 0.7–4.0)
MCH: 36.1 pg — ABNORMAL HIGH (ref 26.0–34.0)
MCHC: 34.2 g/dL (ref 30.0–36.0)
MCV: 105.4 fL — ABNORMAL HIGH (ref 80.0–100.0)
Monocytes Absolute: 0.3 10*3/uL (ref 0.1–1.0)
Monocytes Relative: 6 %
Neutro Abs: 2.9 10*3/uL (ref 1.7–7.7)
Neutrophils Relative %: 75 %
Platelets: 288 10*3/uL (ref 150–400)
RBC: 2.77 MIL/uL — ABNORMAL LOW (ref 3.87–5.11)
RDW: 14.5 % (ref 11.5–15.5)
WBC: 3.9 10*3/uL — ABNORMAL LOW (ref 4.0–10.5)
nRBC: 0 % (ref 0.0–0.2)

## 2020-09-16 LAB — COMPREHENSIVE METABOLIC PANEL
ALT: 14 U/L (ref 0–44)
AST: 19 U/L (ref 15–41)
Albumin: 3.6 g/dL (ref 3.5–5.0)
Alkaline Phosphatase: 66 U/L (ref 38–126)
Anion gap: 12 (ref 5–15)
BUN: 19 mg/dL (ref 8–23)
CO2: 19 mmol/L — ABNORMAL LOW (ref 22–32)
Calcium: 9.1 mg/dL (ref 8.9–10.3)
Chloride: 111 mmol/L (ref 98–111)
Creatinine, Ser: 1.15 mg/dL — ABNORMAL HIGH (ref 0.44–1.00)
GFR, Estimated: 48 mL/min — ABNORMAL LOW (ref >60)
Glucose, Bld: 107 mg/dL — ABNORMAL HIGH (ref 70–99)
Potassium: 4 mmol/L (ref 3.5–5.1)
Sodium: 142 mmol/L (ref 135–145)
Total Bilirubin: 0.3 mg/dL (ref 0.3–1.2)
Total Protein: 7.5 g/dL (ref 6.5–8.1)

## 2020-09-16 LAB — MAGNESIUM: Magnesium: 1 mg/dL — ABNORMAL LOW (ref 1.7–2.4)

## 2020-09-17 ENCOUNTER — Telehealth: Payer: Self-pay | Admitting: Oncology

## 2020-09-17 LAB — CANCER ANTIGEN 27.29: CA 27.29: 49.3 U/mL — ABNORMAL HIGH (ref 0.0–38.6)

## 2020-09-17 NOTE — Telephone Encounter (Signed)
Scheduled appointment per 08/15 los. Left message.

## 2020-09-26 ENCOUNTER — Inpatient Hospital Stay: Payer: Medicare Other

## 2020-09-26 ENCOUNTER — Other Ambulatory Visit: Payer: Self-pay | Admitting: Pharmacist

## 2020-09-26 ENCOUNTER — Inpatient Hospital Stay: Payer: Medicare Other | Admitting: Pharmacist

## 2020-09-26 ENCOUNTER — Other Ambulatory Visit: Payer: Self-pay

## 2020-09-26 VITALS — BP 133/70 | HR 90 | Temp 98.0°F | Resp 20

## 2020-09-26 DIAGNOSIS — C50211 Malignant neoplasm of upper-inner quadrant of right female breast: Secondary | ICD-10-CM

## 2020-09-26 DIAGNOSIS — Z170 Estrogen receptor positive status [ER+]: Secondary | ICD-10-CM | POA: Diagnosis not present

## 2020-09-26 DIAGNOSIS — C50212 Malignant neoplasm of upper-inner quadrant of left female breast: Secondary | ICD-10-CM

## 2020-09-26 DIAGNOSIS — R9389 Abnormal findings on diagnostic imaging of other specified body structures: Secondary | ICD-10-CM

## 2020-09-26 DIAGNOSIS — C50911 Malignant neoplasm of unspecified site of right female breast: Secondary | ICD-10-CM

## 2020-09-26 DIAGNOSIS — C7951 Secondary malignant neoplasm of bone: Secondary | ICD-10-CM

## 2020-09-26 DIAGNOSIS — D649 Anemia, unspecified: Secondary | ICD-10-CM | POA: Diagnosis not present

## 2020-09-26 DIAGNOSIS — Z79818 Long term (current) use of other agents affecting estrogen receptors and estrogen levels: Secondary | ICD-10-CM | POA: Diagnosis not present

## 2020-09-26 DIAGNOSIS — C773 Secondary and unspecified malignant neoplasm of axilla and upper limb lymph nodes: Secondary | ICD-10-CM | POA: Diagnosis not present

## 2020-09-26 DIAGNOSIS — Z79899 Other long term (current) drug therapy: Secondary | ICD-10-CM | POA: Diagnosis not present

## 2020-09-26 LAB — CBC WITH DIFFERENTIAL/PLATELET
Abs Immature Granulocytes: 0 10*3/uL (ref 0.00–0.07)
Basophils Absolute: 0 10*3/uL (ref 0.0–0.1)
Basophils Relative: 1 %
Eosinophils Absolute: 0 10*3/uL (ref 0.0–0.5)
Eosinophils Relative: 1 %
HCT: 26.5 % — ABNORMAL LOW (ref 36.0–46.0)
Hemoglobin: 9.1 g/dL — ABNORMAL LOW (ref 12.0–15.0)
Immature Granulocytes: 0 %
Lymphocytes Relative: 23 %
Lymphs Abs: 0.7 10*3/uL (ref 0.7–4.0)
MCH: 36 pg — ABNORMAL HIGH (ref 26.0–34.0)
MCHC: 34.3 g/dL (ref 30.0–36.0)
MCV: 104.7 fL — ABNORMAL HIGH (ref 80.0–100.0)
Monocytes Absolute: 0.3 10*3/uL (ref 0.1–1.0)
Monocytes Relative: 9 %
Neutro Abs: 1.9 10*3/uL (ref 1.7–7.7)
Neutrophils Relative %: 66 %
Platelets: 178 10*3/uL (ref 150–400)
RBC: 2.53 MIL/uL — ABNORMAL LOW (ref 3.87–5.11)
RDW: 14.8 % (ref 11.5–15.5)
WBC: 2.9 10*3/uL — ABNORMAL LOW (ref 4.0–10.5)
nRBC: 0 % (ref 0.0–0.2)

## 2020-09-26 LAB — COMPREHENSIVE METABOLIC PANEL
ALT: 13 U/L (ref 0–44)
AST: 17 U/L (ref 15–41)
Albumin: 3.5 g/dL (ref 3.5–5.0)
Alkaline Phosphatase: 62 U/L (ref 38–126)
Anion gap: 9 (ref 5–15)
BUN: 24 mg/dL — ABNORMAL HIGH (ref 8–23)
CO2: 20 mmol/L — ABNORMAL LOW (ref 22–32)
Calcium: 8.9 mg/dL (ref 8.9–10.3)
Chloride: 112 mmol/L — ABNORMAL HIGH (ref 98–111)
Creatinine, Ser: 1.17 mg/dL — ABNORMAL HIGH (ref 0.44–1.00)
GFR, Estimated: 47 mL/min — ABNORMAL LOW (ref >60)
Glucose, Bld: 100 mg/dL — ABNORMAL HIGH (ref 70–99)
Potassium: 3.8 mmol/L (ref 3.5–5.1)
Sodium: 141 mmol/L (ref 135–145)
Total Bilirubin: 0.3 mg/dL (ref 0.3–1.2)
Total Protein: 7.3 g/dL (ref 6.5–8.1)

## 2020-09-26 LAB — MAGNESIUM: Magnesium: 0.9 mg/dL — CL (ref 1.7–2.4)

## 2020-09-26 MED ORDER — SODIUM CHLORIDE 0.9 % IV SOLN: INTRAVENOUS | Status: DC

## 2020-09-26 MED ORDER — DENOSUMAB 120 MG/1.7ML ~~LOC~~ SOLN
120.0000 mg | Freq: Once | SUBCUTANEOUS | Status: AC
  Administered 2020-09-26: 120 mg via SUBCUTANEOUS
  Filled 2020-09-26: qty 1.7

## 2020-09-26 MED ORDER — MAGNESIUM SULFATE 4 GM/100ML IV SOLN
4.0000 g | Freq: Once | INTRAVENOUS | Status: AC
  Administered 2020-09-26: 4 g via INTRAVENOUS
  Filled 2020-09-26: qty 100

## 2020-09-26 MED ORDER — FULVESTRANT 250 MG/5ML IM SOLN
500.0000 mg | Freq: Once | INTRAMUSCULAR | Status: AC
  Administered 2020-09-26: 500 mg via INTRAMUSCULAR
  Filled 2020-09-26: qty 10

## 2020-09-26 MED ORDER — SODIUM CHLORIDE 0.9 % IV SOLN: Freq: Once | INTRAVENOUS | Status: AC

## 2020-09-26 NOTE — Progress Notes (Signed)
Per Hinda Lenis RN, pt can receive Xgeva today.

## 2020-09-26 NOTE — Progress Notes (Signed)
[place] at [facility] [phone] [phone]        Oncology Clinical Pharmacist Practitioner Note  Stacey [name] is a 80 y.o. female with a diagnosis of breast cancer currently on palbociclib/fulvestrant/anastrozole/xgeva under the care of Dr. Lurline Del.  She was last seen by Dr. Jana Hakim on 09/16/20 and was here today for Xgeva and Fulvestrant administration.  Her magnesium level today was 0.9 mg/dL. Clinical pharmacy was asked by Dr. Jana Hakim to see patient to discuss her ongoing diarrhea/hypomagnesemia issues.    Patient states that she did not have diarrhea until starting palbociclib and the diarrhea can be difficult for her because sometimes she will have accidents when out running errands and it can effect her quality of life.  We reviewed her medications and confirmed that she is taking per her record magnesium oxide 250 mg by mouth once daily.  This has been added to her active medications.  Reviewed recommendations with Dr. Jana Hakim which included giving 4 grams of IV magnesium today over 2 hours, starting loperamide as needed (instructions given to patient) and to recheck her magnesium on Tuesday 10/01/20 (patient has dental procedure on Monday).  Instructed Stacey [name] that if diarrhea does not subside when starting loperamide to contact the clinic triage line at [phone] to discuss other options which include holding triamterene-hctz, increasing magnesium oxide dose, and/or switching to Lomotil.  All of these recommendations were discussed with Dr. Jana Hakim and Stacey [name] and they were in agreement with the plan.  I spent 45 minutes assessing and educating the patient.  Clinical pharmacy will continue to support Stacey [name]  and Dr. Jana Hakim as needed.  Raina Mina, RPH-CPP,  09/26/2020  3:58 PM

## 2020-09-26 NOTE — Patient Instructions (Signed)
Instructions for low magnesium level  - Start taking loperamide (Imodium) if you have loose stool. You can take 2 tablet (4 mg) at your first loose stool of the day, then 1 tablet (2 mg) for every loose stool after.  Please do not take more than 8 tablets (16 mg) in a 24 hour period. Continue loperamide as needed until 12 hours have passed without a loose stool.  - Scheduling will call you to confirm lab appointment on Tuesday (10/01/20) to check your magnesium level.  I will be here so if it is low, we can talk about other options for keeping your magnesium at a normal level  - If you continue to have diarrhea throughout tonight, please contact our Clinic Triage Line at [phone] so we can discuss other options for your diarrhea and magnesium levels.    - If any other symptoms or side effects arise, please call the above number

## 2020-09-27 ENCOUNTER — Telehealth: Payer: Self-pay | Admitting: Oncology

## 2020-09-27 ENCOUNTER — Other Ambulatory Visit: Payer: Self-pay | Admitting: Pharmacist

## 2020-09-27 NOTE — Telephone Encounter (Signed)
Scheduled appointment per 08/24 los. Left message.

## 2020-10-01 ENCOUNTER — Other Ambulatory Visit: Payer: Self-pay | Admitting: Pharmacist

## 2020-10-01 ENCOUNTER — Other Ambulatory Visit: Payer: Self-pay

## 2020-10-01 ENCOUNTER — Inpatient Hospital Stay: Payer: Medicare Other | Admitting: Pharmacist

## 2020-10-01 ENCOUNTER — Inpatient Hospital Stay: Payer: Medicare Other

## 2020-10-01 DIAGNOSIS — R9389 Abnormal findings on diagnostic imaging of other specified body structures: Secondary | ICD-10-CM

## 2020-10-01 DIAGNOSIS — C773 Secondary and unspecified malignant neoplasm of axilla and upper limb lymph nodes: Secondary | ICD-10-CM | POA: Diagnosis not present

## 2020-10-01 DIAGNOSIS — Z79818 Long term (current) use of other agents affecting estrogen receptors and estrogen levels: Secondary | ICD-10-CM | POA: Diagnosis not present

## 2020-10-01 DIAGNOSIS — C50212 Malignant neoplasm of upper-inner quadrant of left female breast: Secondary | ICD-10-CM

## 2020-10-01 DIAGNOSIS — C50211 Malignant neoplasm of upper-inner quadrant of right female breast: Secondary | ICD-10-CM

## 2020-10-01 DIAGNOSIS — Z170 Estrogen receptor positive status [ER+]: Secondary | ICD-10-CM | POA: Diagnosis not present

## 2020-10-01 DIAGNOSIS — C50911 Malignant neoplasm of unspecified site of right female breast: Secondary | ICD-10-CM

## 2020-10-01 DIAGNOSIS — D649 Anemia, unspecified: Secondary | ICD-10-CM | POA: Diagnosis not present

## 2020-10-01 DIAGNOSIS — Z79899 Other long term (current) drug therapy: Secondary | ICD-10-CM | POA: Diagnosis not present

## 2020-10-01 LAB — COMPREHENSIVE METABOLIC PANEL
ALT: 14 U/L (ref 0–44)
AST: 19 U/L (ref 15–41)
Albumin: 3.6 g/dL (ref 3.5–5.0)
Alkaline Phosphatase: 62 U/L (ref 38–126)
Anion gap: 8 (ref 5–15)
BUN: 25 mg/dL — ABNORMAL HIGH (ref 8–23)
CO2: 20 mmol/L — ABNORMAL LOW (ref 22–32)
Calcium: 8.8 mg/dL — ABNORMAL LOW (ref 8.9–10.3)
Chloride: 110 mmol/L (ref 98–111)
Creatinine, Ser: 1.37 mg/dL — ABNORMAL HIGH (ref 0.44–1.00)
GFR, Estimated: 39 mL/min — ABNORMAL LOW (ref >60)
Glucose, Bld: 118 mg/dL — ABNORMAL HIGH (ref 70–99)
Potassium: 4.4 mmol/L (ref 3.5–5.1)
Sodium: 138 mmol/L (ref 135–145)
Total Bilirubin: 0.3 mg/dL (ref 0.3–1.2)
Total Protein: 7.4 g/dL (ref 6.5–8.1)

## 2020-10-01 LAB — CBC WITH DIFFERENTIAL/PLATELET
Abs Immature Granulocytes: 0.01 10*3/uL (ref 0.00–0.07)
Basophils Absolute: 0 10*3/uL (ref 0.0–0.1)
Basophils Relative: 1 %
Eosinophils Absolute: 0 10*3/uL (ref 0.0–0.5)
Eosinophils Relative: 0 %
HCT: 27.5 % — ABNORMAL LOW (ref 36.0–46.0)
Hemoglobin: 9.3 g/dL — ABNORMAL LOW (ref 12.0–15.0)
Immature Granulocytes: 0 %
Lymphocytes Relative: 20 %
Lymphs Abs: 0.7 10*3/uL (ref 0.7–4.0)
MCH: 36.2 pg — ABNORMAL HIGH (ref 26.0–34.0)
MCHC: 33.8 g/dL (ref 30.0–36.0)
MCV: 107 fL — ABNORMAL HIGH (ref 80.0–100.0)
Monocytes Absolute: 0.3 10*3/uL (ref 0.1–1.0)
Monocytes Relative: 10 %
Neutro Abs: 2.2 10*3/uL (ref 1.7–7.7)
Neutrophils Relative %: 69 %
Platelets: 151 10*3/uL (ref 150–400)
RBC: 2.57 MIL/uL — ABNORMAL LOW (ref 3.87–5.11)
RDW: 15.1 % (ref 11.5–15.5)
WBC: 3.3 10*3/uL — ABNORMAL LOW (ref 4.0–10.5)
nRBC: 0 % (ref 0.0–0.2)

## 2020-10-01 LAB — MAGNESIUM: Magnesium: 1.3 mg/dL — ABNORMAL LOW (ref 1.7–2.4)

## 2020-10-01 NOTE — Progress Notes (Signed)
[place] [facility] [phone] [phone]        Oncology Clinical Pharmacist Practitioner Note  Stacey [name] is a 80 y.o. female with a diagnosis of breast cancer currently on palbociclib/fulvestrant/anastrozole/xgeva under the care of Dr. Lurline Del.  She was seen by clinical pharmacy last Thursday to address her low magnesium level of 0.9 mg/dL and discuss recommendations.  She received IV magnesium on that day and was told to start loperamide if she had loose stools.  Her magnesium ws rechecked today and has increased to 1.3 mg/dL.  She reports loperamide working as she states when she has had a loose stool, she takes 2 tablets which stops the loose stool for an entire day, sometimes longer.    Recommended that she increase her oral magnesium supplement from 250 mg by mouth once daily to 500 mg by mouth once daily.  She currently has 500 mg tablets at home and 250 mg tablets at home (newer bottle).  She will take one tablet of the 500 mg at first and when these run out, will take 2 of the 250 mg tablets.  She has a follow up appointment with Dr. Jana Hakim on 10/24/20 where her magnesium will again be checked.  She was instructed to call the clinic should the diarrhea stop responding to the loperamide or for any other new symptoms.  She had her root canal done yesterday and has an appointment with an oral surgeon tomorrow for a possible tooth extraction.  Discussed the importance of letting her oral surgeon know about her being on Xgeva which she states they already know but she will remind them.  Dr. Jana Hakim is also aware.  Follow up Plan: - continue loperamide as needed for loose stool - increase magnesium to 500 mg by mouth daily - see Dr. Jana Hakim on 10/24/20 and magnesium level  Stacey [name] participated in the discussion, expressed understanding, and voiced agreement with the above plan. All questions were answered to her satisfaction. The patient  was advised to contact the clinic at [phone] with any questions or concerns prior to her return visit.   Clinical pharmacy will continue to support Stacey [name]  and Dr. Jana Hakim as needed.  Raina Mina, RPH-CPP,  10/01/2020  3:11 PM

## 2020-10-14 ENCOUNTER — Ambulatory Visit (INDEPENDENT_AMBULATORY_CARE_PROVIDER_SITE_OTHER): Payer: Medicare Other | Admitting: Otolaryngology

## 2020-10-14 ENCOUNTER — Other Ambulatory Visit: Payer: Self-pay

## 2020-10-14 DIAGNOSIS — J310 Chronic rhinitis: Secondary | ICD-10-CM

## 2020-10-14 DIAGNOSIS — J342 Deviated nasal septum: Secondary | ICD-10-CM

## 2020-10-14 MED ORDER — TRIAMCINOLONE ACETONIDE 55 MCG/ACT NA AERO: 2.0000 | INHALATION_SPRAY | Freq: Every day | NASAL | 12 refills | Status: DC

## 2020-10-14 NOTE — Progress Notes (Signed)
HPI: Stacey [name] is a 80 y.o. female who presents is referred by her PCP for evaluation of chronic nasal sinus complaints.  She complains of a chronic postnasal drainage.  This initially began back in May when she had a terrible cough and a lot of sinus drainage.  She complains of a lot of congestion in the back of her nose and postnasal drainage.  She was having a lot of thick colored discharge from her nose.  It has gradually gotten little bit better but she still has complaints of nasal congestion and postnasal drainage. She has metastatic breast cancer and is on therapy for this. She has used Flonase intermittently in the past but is not using any nasal sprays presently..  Past Medical History:  Diagnosis Date   ANEMIA-NOS    ANXIETY    Breast cancer ([place]) 1997 L, 2012 R   s/p chemo/xrt   COPD    resolved   DIVERTICULOSIS, COLON 2008   Dizziness    Family history of breast cancer    Family history of lung cancer    Family history of lymphoma    Family history of pancreatic cancer    Family history of uterine cancer    GERD    Hx of radiation therapy 10/19/11 -12/03/11   right breast   HYPERLIPIDEMIA    IRRITABLE BOWEL SYNDROME, HX OF    Left-sided carotid artery disease (HCC)    moderate left ICA stenosis   OSTEOARTHRITIS, HAND    PSVT (paroxysmal supraventricular tachycardia) ([place])    symptomatic on event monitor   Past Surgical History:  Procedure Laterality Date   ABDOMINAL HYSTERECTOMY     APPENDECTOMY     BREAST BIOPSY  11/14/10    r breast: inv, insitu mammary carcinoma w/calcif, er/pr +, her2 -   BREAST SURGERY     lumpectomy   CATARACT EXTRACTION     both eyes   ELECTROPHYSIOLOGIC STUDY N/A 05/10/2015   Procedure: SVT Ablation;  Surgeon: Will Meredith Leeds, MD;  Location: [facility];  Service: Cardiovascular;  Laterality: N/A;   HERNIA REPAIR     inguinal herniorrhapy  1984   left   IR US GUIDE BX ASP/DRAIN  05/26/2019   rectal fissure repair     s/p  benign breast biopsy  2003   right   s/p left foot surgury  2009   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   spiral fx left foot  2008   no surgury   TMJ ARTHROPLASTY  1989   TONGUE SURGERY     1988- to remove scar tissue growth    TONSILLECTOMY     Social History   Socioeconomic History   Marital status: Divorced    Spouse name: 2 Step-children   Number of children: 2   Years of education: Not on file   Highest education level: Not on file  Occupational History   Occupation: retired Banker  Tobacco Use   Smoking status: Former    Packs/day: 1.00    Years: 54.00    Pack years: 54.00    Types: Cigarettes    Quit date: 01/03/2016    Years since quitting: 4.7   Smokeless tobacco: Never  Vaping Use   Vaping Use: Never used  Substance and Sexual Activity   Alcohol use: Yes    Comment: rare/ drinks socially   Drug use: No   Sexual activity: Never  Other Topics Concern   Not on  file  Social History Narrative   Patient gets no regular exercise   No biological children   2 step children   Social Determinants of Health   Financial Resource Strain: Low Risk    Difficulty of Paying Living Expenses: Not hard at all  Food Insecurity: No Food Insecurity   Worried About Charity fundraiser in the Last Year: Never true   Arboriculturist in the Last Year: Never true  Transportation Needs: No Transportation Needs   Lack of Transportation (Medical): No   Lack of Transportation (Non-Medical): No  Physical Activity: Sufficiently Active   Days of Exercise per Week: 5 days   Minutes of Exercise per Session: 30 min  Stress: No Stress Concern Present   Feeling of Stress : Not at all  Social Connections: Moderately Integrated   Frequency of Communication with Friends and Family: More than three times a week   Frequency of Social Gatherings with Friends and Family: More than three times a week   Attends Religious Services: More than 4 times per year   Active Member of Clubs or  Organizations: No   Attends Music therapist: More than 4 times per year   Marital Status: Widowed   Family History  Problem Relation Age of Onset   Hypertension Mother    Stroke Mother    Colon polyps Mother    Diabetes Mother    Pancreatic cancer Mother 36   Uterine cancer Mother 73   Lymphoma Brother        burkitts   Lung cancer Paternal Uncle    Lung cancer Maternal Grandmother 14       non-smoker   Lung cancer Maternal Grandfather    Breast cancer Cousin        maternal cousin, dx in her mid 68s   Brain cancer Cousin        maternal cousin's son; dx in his 1s   Testicular cancer Cousin        maternal cousin's son;    Breast cancer Cousin        paternal cousin; dx in her 31s   Breast cancer Cousin        paternal cousin's daughter; dx in 69s; neg genetic testing   Colon cancer Neg Hx    Esophageal cancer Neg Hx    Stomach cancer Neg Hx    Rectal cancer Neg Hx    Allergies  Allergen Reactions   Aminoglycosides    Bee Venom Swelling   Clindamycin/Lincomycin Diarrhea and Nausea And Vomiting   Codeine Nausea And Vomiting   Prior to Admission medications   Medication Sig Start Date End Date Taking? Authorizing Provider  anastrozole (ARIMIDEX) 1 MG tablet TAKE 1 TABLET(1 MG) BY MOUTH DAILY 06/28/20   Magrinat, Virgie Dad, MD  aspirin 81 MG EC tablet Take 81 mg by mouth daily.    [provider]  calcium-vitamin D (OSCAL WITH D) 500-200 MG-UNIT per tablet Take 1 tablet by mouth daily.    [provider]  cholecalciferol (VITAMIN D) 1000 UNITS tablet Take 1,000 Units by mouth daily.    [provider]  cholestyramine (QUESTRAN) 4 g packet MIX AND DRINK 1 PACKET(4 GRAMS) BY MOUTH THREE TIMES DAILY 09/05/20   Causey, Charlestine Massed, NP  clotrimazole-betamethasone (LOTRISONE) cream Apply 1 application topically 2 (two) times daily as needed. 04/09/17   Biagio Borg, MD  diclofenac sodium (VOLTAREN) 1 % GEL Apply 4 g topically 4 (four)  times daily as needed. Patient not taking: Reported on 09/26/2020 10/20/17   Biagio Borg, MD  diphenhydrAMINE (BENADRYL) 25 MG tablet Take 25 mg by mouth every 6 (six) hours as needed (for bee stings).     [provider]  fluticasone (FLONASE) 50 MCG/ACT nasal spray Place 2 sprays into both nostrils daily. Patient not taking: Reported on 09/26/2020    [provider]  fulvestrant (FASLODEX) 250 MG/5ML injection Inject 250 mg into the muscle once. One injection each buttock over 1-2 minutes. Warm prior to use. 11/23/19   [provider]  loperamide (IMODIUM) 2 MG capsule Take 2 tablets (4 mg) by mouth for first loose stool, then 1 tablet (2 mg) for every loose stool thereafter. Do not exceed 8 tablets (16 mg) in a 24 hour period. Stop loperamide if 12 hours have passed without a loose stool. 09/27/20   Magrinat, Virgie Dad, MD  loratadine (CLARITIN) 10 MG tablet Take 10 mg by mouth daily.    [provider]  Magnesium Oxide 250 MG TABS Take 500 mg by mouth once daily. Administer (preferably with food) at least 2 hours apart from other medications. 10/01/20   Magrinat, Virgie Dad, MD  Multiple Vitamin (MULTIVITAMIN) capsule Take 1 capsule by mouth daily.    [provider]  omeprazole (PRILOSEC) 20 MG capsule TAKE 1 CAPSULE(20 MG) BY MOUTH DAILY 03/15/20   Biagio Borg, MD  palbociclib Leslee Home) 100 MG tablet Take 1 tablet (100 mg total) by mouth Monday through Friday for 3 weeks, then one week off, then repeat 11/15/19   Magrinat, Virgie Dad, MD  prochlorperazine (COMPAZINE) 10 MG tablet Take 1 tablet (10 mg total) by mouth every 6 (six) hours as needed for nausea or vomiting. Patient not taking: Reported on 09/26/2020 06/26/19   Magrinat, Virgie Dad, MD  pseudoephedrine (SUDAFED) 30 MG tablet Take 30 mg by mouth every 4 (four) hours as needed for congestion.  Patient not taking: Reported on 09/26/2020    [provider]  telmisartan (MICARDIS) 40 MG tablet  TAKE 1 TABLET(40 MG) BY MOUTH DAILY 07/10/20   Biagio Borg, MD  Tiotropium Bromide Monohydrate (SPIRIVA RESPIMAT) 2.5 MCG/ACT AERS Inhale 2 puffs into the lungs daily. 12/25/19   Tanda Rockers, MD  triamterene-hydrochlorothiazide (DYAZIDE) 37.5-25 MG capsule Take 1 each (1 capsule total) by mouth in the morning. 12/21/19   Magrinat, Virgie Dad, MD  zolpidem (AMBIEN) 10 MG tablet TAKE 1 TABLET(10 MG) BY MOUTH AT BEDTIME AS NEEDED 08/13/20   Biagio Borg, MD     Positive ROS: Otherwise negative  All other systems have been reviewed and were otherwise negative with the exception of those mentioned in the HPI and as above.  Physical Exam: Constitutional: Alert, well-appearing, no acute distress Ears: External ears without lesions or tenderness. Ear canals are clear bilaterally with intact, clear TMs.  Nasal: External nose without lesions. Septum is deviated to the left..  After decongesting the nose nasal endoscopy was performed.  On nasal endoscopy the right middle meatus is reasonably clear with no active mucopurulent discharge noted.  The left middle meatus is very narrowed with a small amount of thick colored mucus discharge from the left middle meatus.  The nasopharynx was clear bilaterally.  Septum is deviated to the left. Oral: Lips and gums without lesions. Tongue and palate mucosa without lesions. Posterior oropharynx clear.  No obvious mucopurulent discharge noted in the posterior oropharynx. Neck: No palpable adenopathy or masses Respiratory: Breathing comfortably  Skin: No facial/neck lesions or rash noted.  Procedures  Assessment: Septal deviation to the left. Probable chronic sinus infection more so on the left side.  Plan: She does not want to take further antibiotic therapy. I reviewed with her that the best treatment for this would be regular use of nasal steroid spray and prescribed Nasacort 2 sprays each nostril at night as this will provide better drainage of the sinuses  as well as a better breathing through her nose. Also recommended use of saline nasal irrigation during the day for postnasal drainage.  Suggested using the AYR or Xlear nasal saline rinses. She is not interested in any surgical intervention and I am not sure this would be necessarily indicated without further evaluation with CT scans of the sinuses.   Radene Journey, MD   CC:

## 2020-10-24 ENCOUNTER — Inpatient Hospital Stay (HOSPITAL_BASED_OUTPATIENT_CLINIC_OR_DEPARTMENT_OTHER): Payer: Medicare Other | Admitting: Oncology

## 2020-10-24 ENCOUNTER — Inpatient Hospital Stay: Payer: Medicare Other | Attending: Adult Health

## 2020-10-24 ENCOUNTER — Other Ambulatory Visit: Payer: Self-pay

## 2020-10-24 ENCOUNTER — Inpatient Hospital Stay: Payer: Medicare Other

## 2020-10-24 VITALS — BP 129/48 | HR 87 | Temp 97.7°F | Resp 18 | Wt 148.9 lb

## 2020-10-24 DIAGNOSIS — C50912 Malignant neoplasm of unspecified site of left female breast: Secondary | ICD-10-CM

## 2020-10-24 DIAGNOSIS — C7951 Secondary malignant neoplasm of bone: Secondary | ICD-10-CM

## 2020-10-24 DIAGNOSIS — Z170 Estrogen receptor positive status [ER+]: Secondary | ICD-10-CM

## 2020-10-24 DIAGNOSIS — C50211 Malignant neoplasm of upper-inner quadrant of right female breast: Secondary | ICD-10-CM | POA: Insufficient documentation

## 2020-10-24 DIAGNOSIS — K219 Gastro-esophageal reflux disease without esophagitis: Secondary | ICD-10-CM | POA: Diagnosis not present

## 2020-10-24 DIAGNOSIS — C50212 Malignant neoplasm of upper-inner quadrant of left female breast: Secondary | ICD-10-CM

## 2020-10-24 DIAGNOSIS — M8580 Other specified disorders of bone density and structure, unspecified site: Secondary | ICD-10-CM | POA: Insufficient documentation

## 2020-10-24 DIAGNOSIS — R9389 Abnormal findings on diagnostic imaging of other specified body structures: Secondary | ICD-10-CM

## 2020-10-24 DIAGNOSIS — Z801 Family history of malignant neoplasm of trachea, bronchus and lung: Secondary | ICD-10-CM | POA: Insufficient documentation

## 2020-10-24 DIAGNOSIS — Z7982 Long term (current) use of aspirin: Secondary | ICD-10-CM | POA: Insufficient documentation

## 2020-10-24 DIAGNOSIS — E785 Hyperlipidemia, unspecified: Secondary | ICD-10-CM | POA: Diagnosis not present

## 2020-10-24 DIAGNOSIS — Z79818 Long term (current) use of other agents affecting estrogen receptors and estrogen levels: Secondary | ICD-10-CM | POA: Insufficient documentation

## 2020-10-24 DIAGNOSIS — Z803 Family history of malignant neoplasm of breast: Secondary | ICD-10-CM | POA: Diagnosis not present

## 2020-10-24 DIAGNOSIS — Z79899 Other long term (current) drug therapy: Secondary | ICD-10-CM | POA: Insufficient documentation

## 2020-10-24 DIAGNOSIS — C50911 Malignant neoplasm of unspecified site of right female breast: Secondary | ICD-10-CM

## 2020-10-24 DIAGNOSIS — Z79811 Long term (current) use of aromatase inhibitors: Secondary | ICD-10-CM | POA: Insufficient documentation

## 2020-10-24 LAB — COMPREHENSIVE METABOLIC PANEL
ALT: 10 U/L (ref 0–44)
AST: 16 U/L (ref 15–41)
Albumin: 3.4 g/dL — ABNORMAL LOW (ref 3.5–5.0)
Alkaline Phosphatase: 64 U/L (ref 38–126)
Anion gap: 10 (ref 5–15)
BUN: 20 mg/dL (ref 8–23)
CO2: 20 mmol/L — ABNORMAL LOW (ref 22–32)
Calcium: 9.2 mg/dL (ref 8.9–10.3)
Chloride: 109 mmol/L (ref 98–111)
Creatinine, Ser: 1.25 mg/dL — ABNORMAL HIGH (ref 0.44–1.00)
GFR, Estimated: 44 mL/min — ABNORMAL LOW (ref >60)
Glucose, Bld: 113 mg/dL — ABNORMAL HIGH (ref 70–99)
Potassium: 4.5 mmol/L (ref 3.5–5.1)
Sodium: 139 mmol/L (ref 135–145)
Total Bilirubin: 0.3 mg/dL (ref 0.3–1.2)
Total Protein: 7.1 g/dL (ref 6.5–8.1)

## 2020-10-24 LAB — CBC WITH DIFFERENTIAL/PLATELET
Abs Immature Granulocytes: 0 10*3/uL (ref 0.00–0.07)
Basophils Absolute: 0 10*3/uL (ref 0.0–0.1)
Basophils Relative: 1 %
Eosinophils Absolute: 0 10*3/uL (ref 0.0–0.5)
Eosinophils Relative: 1 %
HCT: 26.6 % — ABNORMAL LOW (ref 36.0–46.0)
Hemoglobin: 8.9 g/dL — ABNORMAL LOW (ref 12.0–15.0)
Immature Granulocytes: 0 %
Lymphocytes Relative: 18 %
Lymphs Abs: 0.5 10*3/uL — ABNORMAL LOW (ref 0.7–4.0)
MCH: 36.3 pg — ABNORMAL HIGH (ref 26.0–34.0)
MCHC: 33.5 g/dL (ref 30.0–36.0)
MCV: 108.6 fL — ABNORMAL HIGH (ref 80.0–100.0)
Monocytes Absolute: 0.4 10*3/uL (ref 0.1–1.0)
Monocytes Relative: 14 %
Neutro Abs: 1.8 10*3/uL (ref 1.7–7.7)
Neutrophils Relative %: 66 %
Platelets: 200 10*3/uL (ref 150–400)
RBC: 2.45 MIL/uL — ABNORMAL LOW (ref 3.87–5.11)
RDW: 14.8 % (ref 11.5–15.5)
WBC: 2.7 10*3/uL — ABNORMAL LOW (ref 4.0–10.5)
nRBC: 0 % (ref 0.0–0.2)

## 2020-10-24 LAB — MAGNESIUM: Magnesium: 1.5 mg/dL — ABNORMAL LOW (ref 1.7–2.4)

## 2020-10-24 MED ORDER — FULVESTRANT 250 MG/5ML IM SOLN
500.0000 mg | Freq: Once | INTRAMUSCULAR | Status: AC
  Administered 2020-10-24: 500 mg via INTRAMUSCULAR
  Filled 2020-10-24: qty 10

## 2020-10-24 NOTE — Progress Notes (Addendum)
ID: Stacey [name]   DOB: [birth year]  MR#: [id number]  OHY#:[id number]  Patient Care Team: Biagio Borg, MD as PCP - General (Internal Medicine) Tyler Pita, MD as Consulting Physician (Radiation Oncology) Roseanne Kaufman, MD as Consulting Physician (Orthopedic Surgery) Lyndal Pulley, DO as Consulting Physician (Family Medicine) Niurka Benecke, Virgie Dad, MD as Consulting Physician (Oncology) Monna Fam, MD as Consulting Physician (Ophthalmology) Knox Royalty, RN as Case Manager Raina Mina, RPH-CPP (Pharmacist) OTHER MD:   CHIEF COMPLAINT: metastatic breast cancer  CURRENT TREATMENT: anastrozole; palbociclib/Ibrance; denosumab/Xgeva every 12 weeks; fulvestrant   INTERVAL HISTORY:   Stacey [name] returns today for follow up of her metastatic breast cancer.  She continues on anastrozole 06/02/2019.  She is tolerating this well so far.  She continues on palbociclib, currently at 173m Monday through Friday (not Saturday or Sunday); three weeks on and one week off. She is having no side effects from this that she is aware of.  She also receives fulvestrant, every 4 weeks.  She tolerates the injections without any difficulty.  She receives denosumab/Xgeva every 12 weeks, most recently on 06/06/2020.  We are still holding this because of extensive dental work but she tells me after her most recent implant "fell out" no more dental work is planned.  Likely we will resume sometime in October.  We are following her CA 27-29:  Lab Results  Component Value Date   CA2729 49.3 (H) 09/16/2020   CA2729 47.2 (H) 08/29/2020   CA2729 50.8 (H) 08/01/2020   CA2729 51.3 (H) 07/04/2020   CA2729 43.4 (H) 06/06/2020   Her most recent staging studies was a chest CT scan 09/10/2020.  This was stable  REVIEW OF SYSTEMS: GWinnifredis busy planning for her December 07, 2020 granddaughters wedding.  She is thinking of wearing deep blue.  She needs something that will hide her left swollen arm as she puts it.   She has pain over the right scapular area with certain movements.  Otherwise a detailed review of systems today was benign   COVID 19 VACCINATION STATUS: Fully vaccinated with booster in September 2021   HISTORY OF PRESENT ILLNESS:  From the original intake note:  " Stacey Monrroyis a 80year old [name], who has a history of breast cancer starting in 1997 and in 2013.    On 11/22/95, she underwent a left breast biopsy, which revealed malignant cells.  She had a left breast lumpectomy with re-excision on 11/29/95, with final pathology revealed IDC, grade 2, 2.5 cm, ER 98%, PR 97%, Ki67 8% with one of 15 nodes being positive.  She received chemotherapy, 4 cycles per patient, along with radiation and took Tamoxifen for 7 years following radiation completion.  Records from 1997 limited.    In October 2012, she had a biopsy of the right breast after mammography recommended additional images for a dense area in the right breast.  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  On 11/25/10, she had an MRI that measured the area in the right breast as 2.2 cm.  She started neoadjuvant [name] in November 2012 and has continued it since.  The area of concern measured 1.7 cm on a repeat MRI in March of 2013.  On 09/03/11, she underwent a right lumpectomy with sentinel lymph node biopsy with final pathology resulting invasive lobular carcinoma with calcifications, grade 2, 2.5 cm  wit lobular carcinoma in situ.  Surgical margins were negative with one of one sentinel lymph node positive for metastatic carcinoma.  She then underwent radiation therapy under the care of Dr. [name] from 10/19/11 to 12/03/11. "   Her subsequent history is as detailed below   PAST MEDICAL HISTORY: Past Medical History:  Diagnosis Date   ANEMIA-NOS    ANXIETY    Breast cancer ([facility]) 1997 L, 2012 R   s/p  chemo/xrt   COPD    resolved   DIVERTICULOSIS, COLON 2008   Dizziness    Family history of breast cancer    Family history of lung cancer    Family history of lymphoma    Family history of pancreatic cancer    Family history of uterine cancer    GERD    Hx of radiation therapy 10/19/11 -12/03/11   right breast   HYPERLIPIDEMIA    IRRITABLE BOWEL SYNDROME, HX OF    Left-sided carotid artery disease (HCC)    moderate left ICA stenosis   OSTEOARTHRITIS, HAND    PSVT (paroxysmal supraventricular tachycardia) ([facility])    symptomatic on event monitor    PAST SURGICAL HISTORY: Past Surgical History:  Procedure Laterality Date   ABDOMINAL HYSTERECTOMY     APPENDECTOMY     BREAST BIOPSY  11/14/10    r breast: inv, insitu mammary carcinoma w/calcif, er/pr +, her2 -   BREAST SURGERY     lumpectomy   CATARACT EXTRACTION     both eyes   ELECTROPHYSIOLOGIC STUDY N/A 05/10/2015   Procedure: SVT Ablation;  Surgeon: Will Meredith Leeds, MD;  Location: [facility];  Service: Cardiovascular;  Laterality: N/A;   HERNIA REPAIR     inguinal herniorrhapy  1984   left   IR US GUIDE BX ASP/DRAIN  05/26/2019   rectal fissure repair     s/p benign breast biopsy  2003   right   s/p left foot surgury  2009   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   spiral fx left foot  2008   no surgury   TMJ ARTHROPLASTY  1989   TONGUE SURGERY     1988- to remove scar tissue growth    TONSILLECTOMY      FAMILY HISTORY Family History  Problem Relation Age of Onset   Hypertension Mother    Stroke Mother    Colon polyps Mother    Diabetes Mother    Pancreatic cancer Mother 52   Uterine cancer Mother 72   Lymphoma Brother        burkitts   Lung cancer Paternal Uncle    Lung cancer Maternal Grandmother 7       non-smoker   Lung cancer Maternal Grandfather    Breast cancer Cousin        maternal cousin, dx in her mid 71s   Brain cancer Cousin        maternal cousin's son; dx in his 18s    Testicular cancer Cousin        maternal cousin's son;    Breast cancer Cousin        paternal cousin; dx in her 28s   Breast cancer Cousin        paternal cousin's daughter; dx in 2s; neg genetic testing   Colon cancer Neg Hx    Esophageal cancer Neg Hx    Stomach cancer Neg Hx    Rectal cancer Neg Hx     GYNECOLOGIC  HISTORY:   Menarche age 25, GX P0 (one miscarriage at 5 months). Status post vaginal hysterectomy in the late 1970s, no salpingo-oophorectomy    SOCIAL HISTORY:   The patient lives alone and is divorced. She retired from [employer], were she worked in Physiological scientist.   ADVANCED DIRECTIVES:  Living will in place; the patient's healthcare power of attorney is her stepdaughter, Vladimir Faster, who can be reached at [place]: Social History   Tobacco Use   Smoking status: Former    Packs/day: 1.00    Years: 54.00    Pack years: 54.00    Types: Cigarettes    Quit date: 01/03/2016    Years since quitting: 4.8   Smokeless tobacco: Never  Vaping Use   Vaping Use: Never used  Substance Use Topics   Alcohol use: Yes    Comment: rare/ drinks socially   Drug use: No    Allergies  Allergen Reactions   Aminoglycosides    Bee Venom Swelling   Clindamycin/Lincomycin Diarrhea and Nausea And Vomiting   Codeine Nausea And Vomiting    Current Outpatient Medications  Medication Sig Dispense Refill   anastrozole (ARIMIDEX) 1 MG tablet TAKE 1 TABLET(1 MG) BY MOUTH DAILY 90 tablet 4   aspirin 81 MG EC tablet Take 81 mg by mouth daily.     calcium-vitamin D (OSCAL WITH D) 500-200 MG-UNIT per tablet Take 1 tablet by mouth daily.     cholecalciferol (VITAMIN D) 1000 UNITS tablet Take 1,000 Units by mouth daily.     cholestyramine (QUESTRAN) 4 Stacey packet MIX AND DRINK 1 PACKET(4 GRAMS) BY MOUTH THREE TIMES DAILY 60 each 12   clotrimazole-betamethasone (LOTRISONE) cream Apply 1 application topically 2 (two) times daily as needed. 30 Stacey 1   diclofenac  sodium (VOLTAREN) 1 % GEL Apply 4 Stacey topically 4 (four) times daily as needed. (Patient not taking: Reported on 09/26/2020) 400 Stacey 11   diphenhydrAMINE (BENADRYL) 25 MG tablet Take 25 mg by mouth every 6 (six) hours as needed (for bee stings).      fluticasone (FLONASE) 50 MCG/ACT nasal spray Place 2 sprays into both nostrils daily. (Patient not taking: Reported on 09/26/2020)     fulvestrant (FASLODEX) 250 MG/5ML injection Inject 250 mg into the muscle once. One injection each buttock over 1-2 minutes. Warm prior to use.     loperamide (IMODIUM) 2 MG capsule Take 2 tablets (4 mg) by mouth for first loose stool, then 1 tablet (2 mg) for every loose stool thereafter. Do not exceed 8 tablets (16 mg) in a 24 hour period. Stop loperamide if 12 hours have passed without a loose stool. 30 capsule    loratadine (CLARITIN) 10 MG tablet Take 10 mg by mouth daily.     Magnesium Oxide 250 MG TABS Take 500 mg by mouth once daily. Administer (preferably with food) at least 2 hours apart from other medications. 30 tablet 0   Multiple Vitamin (MULTIVITAMIN) capsule Take 1 capsule by mouth daily.     omeprazole (PRILOSEC) 20 MG capsule TAKE 1 CAPSULE(20 MG) BY MOUTH DAILY 90 capsule 1   palbociclib (IBRANCE) 100 MG tablet Take 1 tablet (100 mg total) by mouth Monday through Friday for 3 weeks, then one week off, then repeat 15 tablet 6   prochlorperazine (COMPAZINE) 10 MG tablet Take 1 tablet (10 mg total) by mouth every 6 (six) hours as needed for nausea or vomiting. (Patient not taking: Reported on 09/26/2020) 30 tablet 1  pseudoephedrine (SUDAFED) 30 MG tablet Take 30 mg by mouth every 4 (four) hours as needed for congestion.  (Patient not taking: Reported on 09/26/2020)     telmisartan (MICARDIS) 40 MG tablet TAKE 1 TABLET(40 MG) BY MOUTH DAILY 90 tablet 1   Tiotropium Bromide Monohydrate (SPIRIVA RESPIMAT) 2.5 MCG/ACT AERS Inhale 2 puffs into the lungs daily. 4 Stacey 11   triamcinolone (NASACORT) 55 MCG/ACT AERO nasal  inhaler Place 2 sprays into the nose daily. 2 sprays each nostril at night before bedtime 1 each 12   triamterene-hydrochlorothiazide (DYAZIDE) 37.5-25 MG capsule Take 1 each (1 capsule total) by mouth in the morning. 90 capsule 4   zolpidem (AMBIEN) 10 MG tablet TAKE 1 TABLET(10 MG) BY MOUTH AT BEDTIME AS NEEDED 90 tablet 1   No current facility-administered medications for this visit.    OBJECTIVE: White woman who appears younger than stated Vitals:   10/24/20 1336  BP: (!) 129/48  Pulse: 87  Resp: 18  Temp: 97.7 F (36.5 C)  SpO2: 98%     Body mass index is 26.38 kg/m.     Sclerae unicteric, EOMs intact Wearing a mask No cervical or supraclavicular adenopathy Lungs no rales or rhonchi Heart regular rate and rhythm Abd soft, nontender, positive bowel sounds MSK chronic left upper extremity lymphedema, stable; severe focal pain in the upper right scapula on palpation Neuro: nonfocal, well oriented, appropriate affect Breasts: Deferred   LAB RESULTS: Lab Results  Component Value Date   WBC 2.7 (L) 10/24/2020   NEUTROABS PENDING 10/24/2020   HGB 8.9 (L) 10/24/2020   HCT 26.6 (L) 10/24/2020   MCV 108.6 (H) 10/24/2020   PLT 200 10/24/2020      Chemistry      Component Value Date/Time   NA 139 10/24/2020 1326   NA 140 12/09/2015 1409   K 4.5 10/24/2020 1326   K 4.2 12/09/2015 1409   CL 109 10/24/2020 1326   CL 107 02/02/2012 0939   CO2 20 (L) 10/24/2020 1326   CO2 25 12/09/2015 1409   BUN 20 10/24/2020 1326   BUN 16.7 12/09/2015 1409   CREATININE 1.25 (H) 10/24/2020 1326   CREATININE 1.02 (H) 05/09/2019 1303   CREATININE 0.8 12/09/2015 1409      Component Value Date/Time   CALCIUM 9.2 10/24/2020 1326   CALCIUM 9.4 12/09/2015 1409   ALKPHOS 64 10/24/2020 1326   ALKPHOS 54 12/09/2015 1409   AST 16 10/24/2020 1326   AST 23 05/09/2019 1303   AST 20 12/09/2015 1409   ALT 10 10/24/2020 1326   ALT 13 05/09/2019 1303   ALT 13 12/09/2015 1409   BILITOT 0.3  10/24/2020 1326   BILITOT 0.4 05/09/2019 1303   BILITOT <0.22 12/09/2015 1409       Lab Results  Component Value Date   LABCA2 56 (H) 12/10/2010    No components found for: EXNTZ001  No results for input(s): INR in the last 168 hours.  Urinalysis    Component Value Date/Time   COLORURINE YELLOW 07/13/2018 0938   APPEARANCEUR [place] (A) 07/13/2018 0938   LABSPEC 1.020 07/13/2018 0938   PHURINE 6.0 07/13/2018 0938   GLUCOSEU NEGATIVE 07/13/2018 0938   HGBUR NEGATIVE 07/13/2018 0938   BILIRUBINUR NEGATIVE 07/13/2018 0938   KETONESUR NEGATIVE 07/13/2018 0938   UROBILINOGEN 0.2 07/13/2018 0938   NITRITE NEGATIVE 07/13/2018 0938   LEUKOCYTESUR TRACE (A) 07/13/2018 0938    STUDIES: No results found.    ASSESSMENT: 80 y.o. [place] woman:  LEFT BREAST #1  S/P LEFT breast lumpectomy with re-excision on 11/29/95 for a T2 N1bi stage IIb invasive ductal carcinoma , grade 2, estrogen receptor 98% positive, progesterone receptor 97% positive, Ki67 8%.  #2 status post 4 cycles of doxorubicin and cyclophosphamide,    (i) followed by radiation therapy under the care of Dr. [name].  #3 received tamoxifen for a total of seven years   RIGHT BREAST #4  S/P biopsy of the RIGHT breast upper inner quadrant on 11/14/10 showing invasive ductal carcinoma,, grade 2, estrogen receptor 85% and progesterone receptor 57% positive, Ki67 20%, HER2 not amplified.    #5 started neoadjuvant letrozole in November 2012; switched to tamoxifen as of February 2016 due to osteopenia concerns  #6  S/P right lumpectomy with sentinel lymph node biopsy on 09/03/11 for a ypT2, ypN1a, stage IIB invasive lobular carcinoma, grade 2,estrogen receptor 97% positive, progesterone receptor 12% positive, with no HER-2 amplification.  #7 status post right breast radiation therapy under the care of Dr. [name] from 10/19/2011 to 12/03/2011.   #8 did not meet criteria for genetic testing according to her insurance  company.  #9 osteopenia, with a T score of -1.8 on DEXA scan at [facility] 03/07/2013  (a) status post multiple dental extractions and implants  (b) repeat [facility] density [facility] 04/02/2015 shows a T score of -2.0  METASTATIC DISEASE: April 2021 (lymph nodes, bone) #10:  left upper extremity lymphedema led to chest CT scan 05/09/2019 showing a 5.1 cm left subpectoral chest wall mass and thoracic lymphadenopathy   (a) CT biopsy of the chest wall mass 05/25/2019 confirms recurrent breast cancer, strongly estrogen and progesterone receptor positive, HER-2 not amplified  (b) PET scan 05/30/2019 shows a left subpectoral mass measuring 5.4 cm, with significant regional and mediastinal adenopathy, sclerotic left scapular metastasis, but no liver or lung involvement.  (c) CA 27-29 is moderately informative  #11 anastrozole started 06/02/2019; palbociclib added 06/08/2019  (a) palbociclib taken irregularly for several months secondary to cytopenias  (b) palbociclib dose decreased to 100 mg daily 21 on 7 off October 2021  (c) CT of the chest with contrast 11/09/2019 shows mild disease progression (despite a continuing drop in the CA 27-29)  (d) fulvestrant added 11/23/2019  (e) repeat chest CT with contrast 09/09/2020 read as stable.  #12 denosumab/Xgeva starting 06/08/2019, to be repeated every 3 months due to hypocalcemia  (a) treatment held briefly after May 2022 dose secondary to dental issues  #13 genetics testing 06/15/2019 through the [facility] Common Hereditary Cancers Panel found no deleterious mutations in APC, ATM, AXIN2, BARD1, BMPR1A, BRCA1, BRCA2, BRIP1, CDH1, CDKN2A (p14ARF), CDKN2A (p16INK4a), CKD4, CHEK2, CTNNA1, DICER1, EPCAM (Deletion/duplication testing only), GREM1 (promoter region deletion/duplication testing only), KIT, MEN1, MLH1, MSH2, MSH3, MSH6, MUTYH, NBN, NF1, NHTL1, PALB2, PDGFRA, PMS2, POLD1, POLE, PTEN, RAD50, RAD51C, RAD51D, RNF43, SDHB, SDHC, SDHD, SMAD4, SMARCA4. STK11, TP53,  TSC1, TSC2, and VHL.  The following genes were evaluated for sequence changes only: SDHA and HOXB13 c.251G>A variant only.   PLAN:   Onia is now a year and a half out from definitive diagnosis of metastatic breast cancer.  Her disease is moderately well controlled.  She is having some more symptoms related to her right scapula.  I think a bone scan at this point may be informative and we will put that order in for her.  She is tolerating the palbociclib generally well.  We have had to drop the doses some.  At this point the plan is to  continue as we have been doing.  She is very excited about the November wedding that she is making plans for.  She made it a point to tell me that she greatly appreciated meeting with our clinical pharmacologist, Gilford Rile to discuss her medications.  Total encounter time 20 minutes.[name] C. Adalee Kathan, MD 10/24/20 2:02 PM Medical Oncology and Hematology [facility] [place], [place] [address] Tel. [phone]    Fax. [phone]   I, Wilburn Mylar, am acting as scribe for Dr. Virgie Dad. Natale Barba.  I, Lurline Del MD, have reviewed the above documentation for accuracy and completeness, and I agree with the above.   *Total Encounter Time as defined by the Centers for Medicare and Medicaid Services includes, in addition to the face-to-face time of a patient visit (documented in the note above) non-face-to-face time: obtaining and reviewing outside history, ordering and reviewing medications, tests or procedures, care coordination (communications with other health care professionals or caregivers) and documentation in the medical record.

## 2020-10-25 LAB — CANCER ANTIGEN 27.29: CA 27.29: 40.2 U/mL — ABNORMAL HIGH (ref 0.0–38.6)

## 2020-10-29 DIAGNOSIS — D225 Melanocytic nevi of trunk: Secondary | ICD-10-CM | POA: Diagnosis not present

## 2020-10-29 DIAGNOSIS — L578 Other skin changes due to chronic exposure to nonionizing radiation: Secondary | ICD-10-CM | POA: Diagnosis not present

## 2020-10-29 DIAGNOSIS — Z23 Encounter for immunization: Secondary | ICD-10-CM | POA: Diagnosis not present

## 2020-10-29 DIAGNOSIS — L853 Xerosis cutis: Secondary | ICD-10-CM | POA: Diagnosis not present

## 2020-10-29 DIAGNOSIS — L821 Other seborrheic keratosis: Secondary | ICD-10-CM | POA: Diagnosis not present

## 2020-10-29 DIAGNOSIS — R609 Edema, unspecified: Secondary | ICD-10-CM | POA: Diagnosis not present

## 2020-10-29 DIAGNOSIS — Z86018 Personal history of other benign neoplasm: Secondary | ICD-10-CM | POA: Diagnosis not present

## 2020-10-29 DIAGNOSIS — L814 Other melanin hyperpigmentation: Secondary | ICD-10-CM | POA: Diagnosis not present

## 2020-11-11 ENCOUNTER — Encounter (HOSPITAL_COMMUNITY)
Admission: RE | Admit: 2020-11-11 | Discharge: 2020-11-11 | Disposition: A | Discharge status: Home / Self Care | Payer: Medicare Other | Source: Ambulatory Visit | Attending: Oncology | Admitting: Oncology

## 2020-11-11 ENCOUNTER — Ambulatory Visit (HOSPITAL_COMMUNITY)
Admission: RE | Admit: 2020-11-11 | Discharge: 2020-11-11 | Disposition: A | Discharge status: Home / Self Care | Payer: Medicare Other | Source: Ambulatory Visit | Attending: Oncology | Admitting: Oncology

## 2020-11-11 ENCOUNTER — Other Ambulatory Visit: Payer: Self-pay

## 2020-11-11 DIAGNOSIS — C7951 Secondary malignant neoplasm of bone: Secondary | ICD-10-CM | POA: Insufficient documentation

## 2020-11-11 DIAGNOSIS — C50912 Malignant neoplasm of unspecified site of left female breast: Secondary | ICD-10-CM | POA: Insufficient documentation

## 2020-11-11 DIAGNOSIS — Z170 Estrogen receptor positive status [ER+]: Secondary | ICD-10-CM | POA: Insufficient documentation

## 2020-11-11 DIAGNOSIS — C50212 Malignant neoplasm of upper-inner quadrant of left female breast: Secondary | ICD-10-CM | POA: Diagnosis not present

## 2020-11-11 DIAGNOSIS — C50919 Malignant neoplasm of unspecified site of unspecified female breast: Secondary | ICD-10-CM | POA: Diagnosis not present

## 2020-11-11 MED ORDER — TECHNETIUM TC 99M MEDRONATE IV KIT
20.0000 | PACK | Freq: Once | INTRAVENOUS | Status: AC | PRN
  Administered 2020-11-11: 19.3 via INTRAVENOUS

## 2020-11-14 ENCOUNTER — Encounter: Payer: Self-pay | Admitting: Oncology

## 2020-11-19 ENCOUNTER — Encounter: Payer: Self-pay | Admitting: Oncology

## 2020-11-19 ENCOUNTER — Telehealth: Payer: Self-pay | Admitting: *Deleted

## 2020-11-19 DIAGNOSIS — Z23 Encounter for immunization: Secondary | ICD-10-CM | POA: Diagnosis not present

## 2020-11-19 NOTE — Telephone Encounter (Signed)
This RN spoke with pt per her my chart message regarding her concern about " six rib fracture per trauma "  She states she has not had any trauma in regards to falls or blunt force injury to that area.  She states the area of description correlates with area of pain she has been having.  This RN discussed above word " trauma" is used for her history of left breast cancer - radiation and now recurrent chest wall cancer.  She also has noted osteopenia which with the above issues may have allowed that area of her ribs to be weakened.  With further discussion she did state she had " severe cough episode that lastest for months " this past summer.  This RN discussed how forceful coughing can cause a fx in the area stated due to it's weakness.  Pt appreciated discussion of clarification of "trauma" in her recent bone scan.  Of note she also stated concern due to being on xgeva and " my teeth are getting loose " -  She states she has about 10 dental implants that are "loosening" with one implant having to be removed.  She is concerned about the use of continued xgeva and possible lose of further implants.  This RN informed her that above would be given to MD for review and that she needs to discuss with him at visit in Nov when she is due for her next xgeva shot.  Post end of conversation pt was appreciative of discussion and explanation of what her bone scan meant.

## 2020-11-21 ENCOUNTER — Inpatient Hospital Stay: Payer: Medicare Other

## 2020-11-21 ENCOUNTER — Other Ambulatory Visit: Payer: Self-pay

## 2020-11-21 ENCOUNTER — Inpatient Hospital Stay: Payer: Medicare Other | Attending: Adult Health

## 2020-11-21 ENCOUNTER — Other Ambulatory Visit: Payer: Self-pay | Admitting: *Deleted

## 2020-11-21 VITALS — BP 127/74 | HR 86 | Temp 98.6°F | Resp 18

## 2020-11-21 DIAGNOSIS — C50212 Malignant neoplasm of upper-inner quadrant of left female breast: Secondary | ICD-10-CM

## 2020-11-21 DIAGNOSIS — Z79818 Long term (current) use of other agents affecting estrogen receptors and estrogen levels: Secondary | ICD-10-CM | POA: Diagnosis not present

## 2020-11-21 DIAGNOSIS — Z170 Estrogen receptor positive status [ER+]: Secondary | ICD-10-CM | POA: Insufficient documentation

## 2020-11-21 DIAGNOSIS — C50911 Malignant neoplasm of unspecified site of right female breast: Secondary | ICD-10-CM

## 2020-11-21 DIAGNOSIS — R9389 Abnormal findings on diagnostic imaging of other specified body structures: Secondary | ICD-10-CM

## 2020-11-21 DIAGNOSIS — C50912 Malignant neoplasm of unspecified site of left female breast: Secondary | ICD-10-CM

## 2020-11-21 DIAGNOSIS — C7951 Secondary malignant neoplasm of bone: Secondary | ICD-10-CM

## 2020-11-21 DIAGNOSIS — C50211 Malignant neoplasm of upper-inner quadrant of right female breast: Secondary | ICD-10-CM | POA: Insufficient documentation

## 2020-11-21 LAB — CBC WITH DIFFERENTIAL/PLATELET
Abs Immature Granulocytes: 0.01 10*3/uL (ref 0.00–0.07)
Basophils Absolute: 0 10*3/uL (ref 0.0–0.1)
Basophils Relative: 1 %
Eosinophils Absolute: 0 10*3/uL (ref 0.0–0.5)
Eosinophils Relative: 1 %
HCT: 27.8 % — ABNORMAL LOW (ref 36.0–46.0)
Hemoglobin: 9.3 g/dL — ABNORMAL LOW (ref 12.0–15.0)
Immature Granulocytes: 0 %
Lymphocytes Relative: 16 %
Lymphs Abs: 0.5 10*3/uL — ABNORMAL LOW (ref 0.7–4.0)
MCH: 36.6 pg — ABNORMAL HIGH (ref 26.0–34.0)
MCHC: 33.5 g/dL (ref 30.0–36.0)
MCV: 109.4 fL — ABNORMAL HIGH (ref 80.0–100.0)
Monocytes Absolute: 0.3 10*3/uL (ref 0.1–1.0)
Monocytes Relative: 11 %
Neutro Abs: 2.1 10*3/uL (ref 1.7–7.7)
Neutrophils Relative %: 71 %
Platelets: 151 10*3/uL (ref 150–400)
RBC: 2.54 MIL/uL — ABNORMAL LOW (ref 3.87–5.11)
RDW: 14.3 % (ref 11.5–15.5)
Smear Review: NORMAL
WBC: 3 10*3/uL — ABNORMAL LOW (ref 4.0–10.5)
nRBC: 0 % (ref 0.0–0.2)

## 2020-11-21 LAB — CMP (CANCER CENTER ONLY)
ALT: 13 U/L (ref 0–44)
AST: 19 U/L (ref 15–41)
Albumin: 4 g/dL (ref 3.5–5.0)
Alkaline Phosphatase: 55 U/L (ref 38–126)
Anion gap: 10 (ref 5–15)
BUN: 27 mg/dL — ABNORMAL HIGH (ref 8–23)
CO2: 19 mmol/L — ABNORMAL LOW (ref 22–32)
Calcium: 9.4 mg/dL (ref 8.9–10.3)
Chloride: 108 mmol/L (ref 98–111)
Creatinine: 1.19 mg/dL — ABNORMAL HIGH (ref 0.44–1.00)
GFR, Estimated: 46 mL/min — ABNORMAL LOW (ref >60)
Glucose, Bld: 113 mg/dL — ABNORMAL HIGH (ref 70–99)
Potassium: 4.2 mmol/L (ref 3.5–5.1)
Sodium: 137 mmol/L (ref 135–145)
Total Bilirubin: 0.3 mg/dL (ref 0.3–1.2)
Total Protein: 8 g/dL (ref 6.5–8.1)

## 2020-11-21 LAB — MAGNESIUM: Magnesium: 1.3 mg/dL — ABNORMAL LOW (ref 1.7–2.4)

## 2020-11-21 MED ORDER — FULVESTRANT 250 MG/5ML IM SOSY
500.0000 mg | PREFILLED_SYRINGE | Freq: Once | INTRAMUSCULAR | Status: AC
  Administered 2020-11-21: 500 mg via INTRAMUSCULAR
  Filled 2020-11-21: qty 10

## 2020-11-21 MED ORDER — FULVESTRANT 250 MG/5ML IM SOLN: 500.0000 mg | Freq: Once | INTRAMUSCULAR | Status: DC

## 2020-11-22 LAB — CANCER ANTIGEN 27.29: CA 27.29: 43.5 U/mL — ABNORMAL HIGH (ref 0.0–38.6)

## 2020-11-25 ENCOUNTER — Encounter: Payer: Self-pay | Admitting: Internal Medicine

## 2020-11-25 ENCOUNTER — Ambulatory Visit (INDEPENDENT_AMBULATORY_CARE_PROVIDER_SITE_OTHER): Payer: Medicare Other | Admitting: Internal Medicine

## 2020-11-25 ENCOUNTER — Other Ambulatory Visit: Payer: Self-pay

## 2020-11-25 DIAGNOSIS — J329 Chronic sinusitis, unspecified: Secondary | ICD-10-CM

## 2020-11-25 DIAGNOSIS — J449 Chronic obstructive pulmonary disease, unspecified: Secondary | ICD-10-CM | POA: Diagnosis not present

## 2020-11-25 IMAGING — CT CT CHEST W/ CM
2 of 3 series · 14 of 36 positions shown, 17 images · IV contrast (omnipaque)
Comparison: CT chest dated 03/06/2011

CLINICAL DATA: Left breast cancer, diagnosed 9666 status post
lumpectomy, with left arm lymphedema in 9454. Right breast cancer,
diagnosed 3925, status post chemotherapy and XRT. Evaluate for
recurrence.

EXAM:
CT CHEST WITH CONTRAST
TECHNIQUE: Multidetector CT imaging of the chest was performed during
intravenous contrast administration.
CONTRAST:  75mL OMNIPAQUE IOHEXOL 300 MG/ML  SOLN

[Series 2: axial st · axial · 0.60mm/px · z∈[-238,-8]mm · 11 of 137 slices shown, 14 images]
[im 11/137  mediastinal]
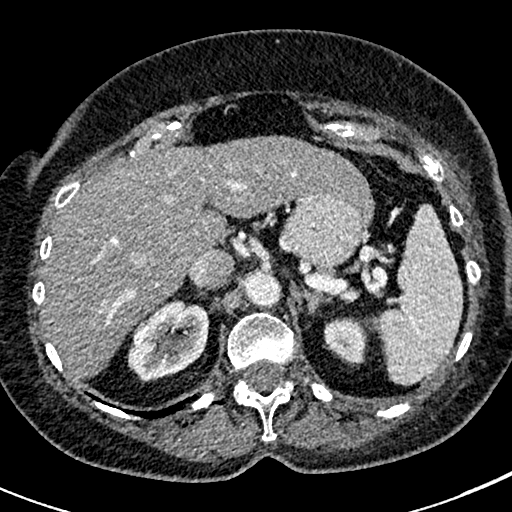
[im 11/137  lung]
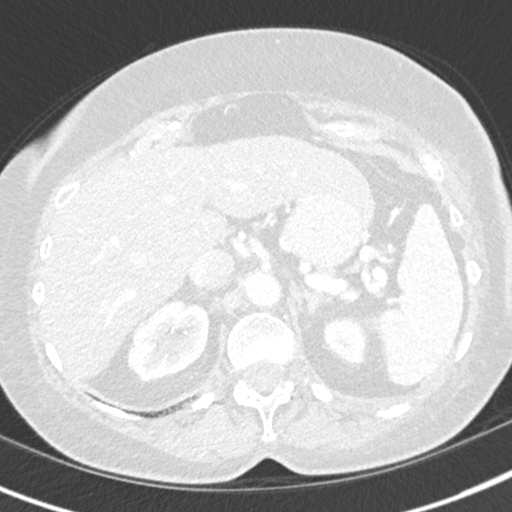
[im 21/137  lung]
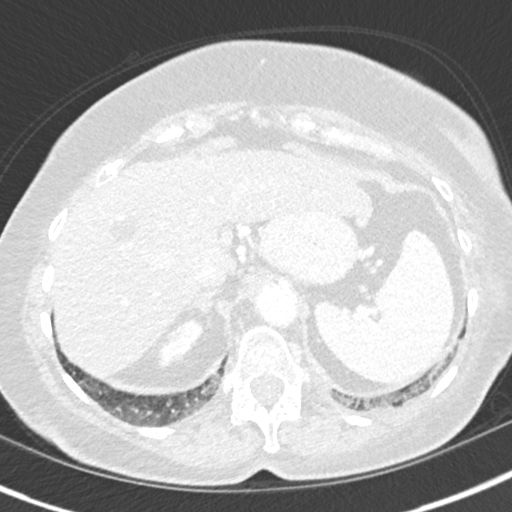
[im 31/137  lung]
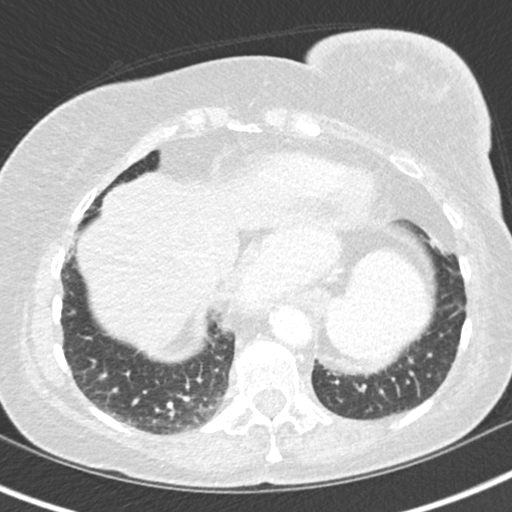
[im 46/137  lung]
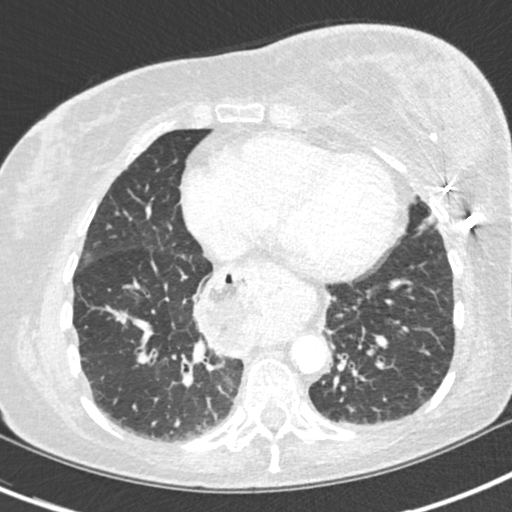
[im 56/137  mediastinal]
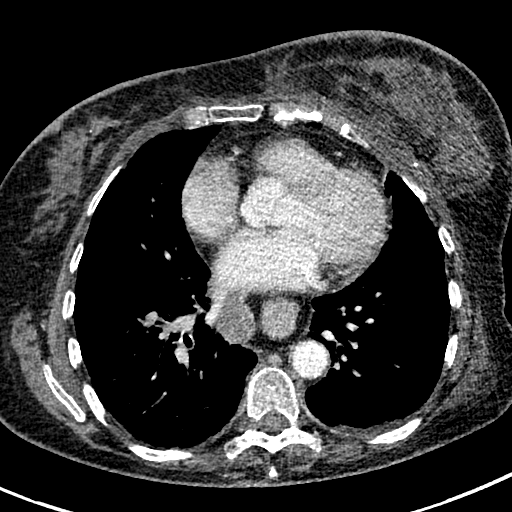
[im 56/137  lung]
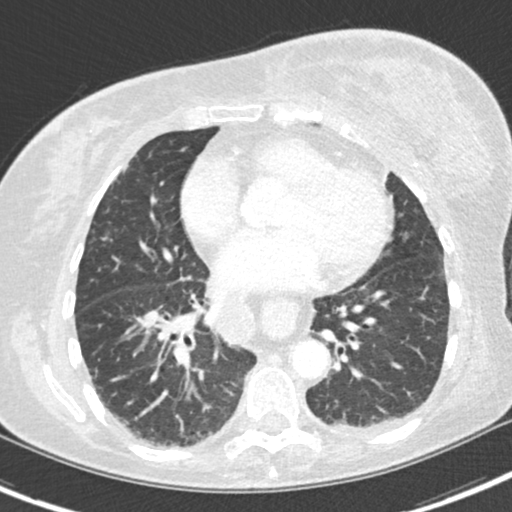
[im 71/137  lung]
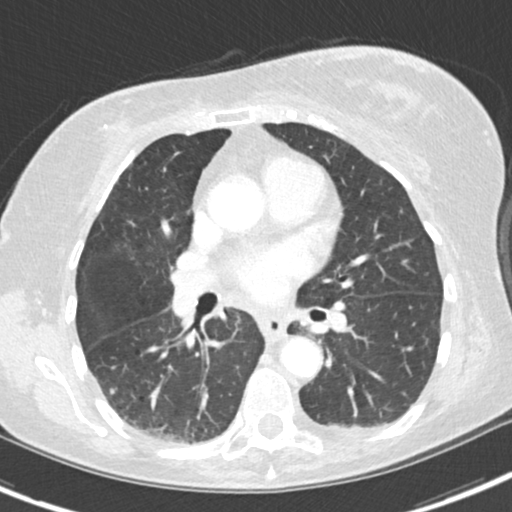
[im 81/137  lung]
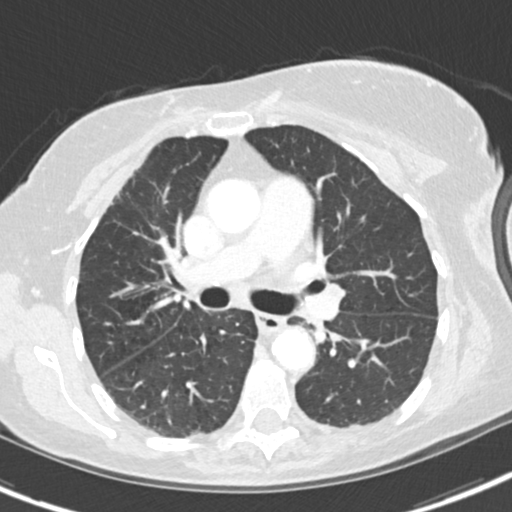
[im 91/137  lung]
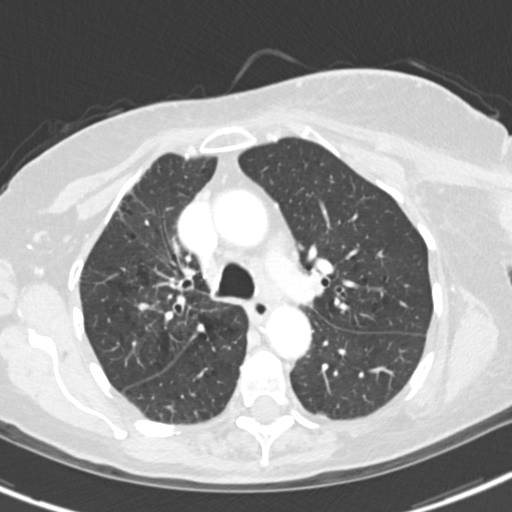
[im 106/137  mediastinal]
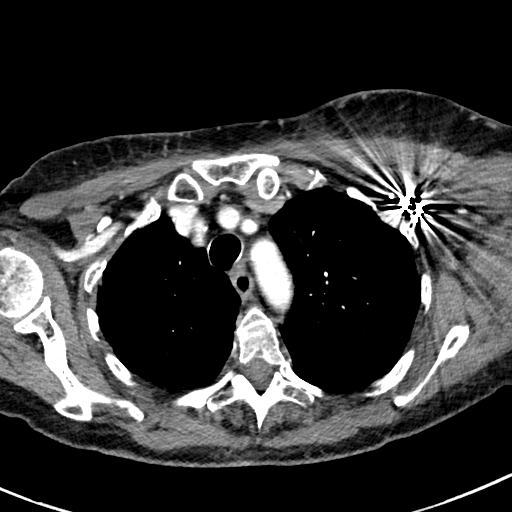
[im 106/137  lung]
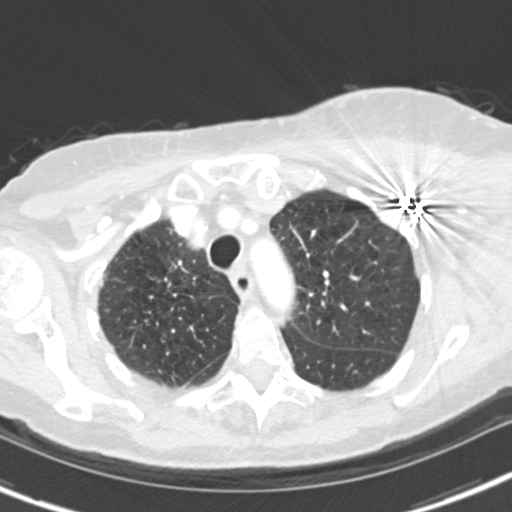
[im 116/137  lung]
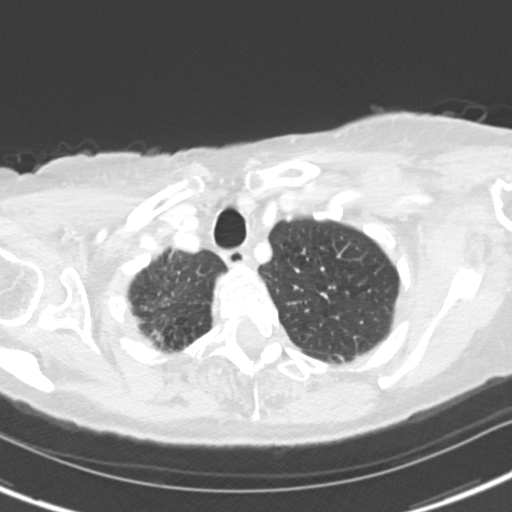
[im 126/137  lung]
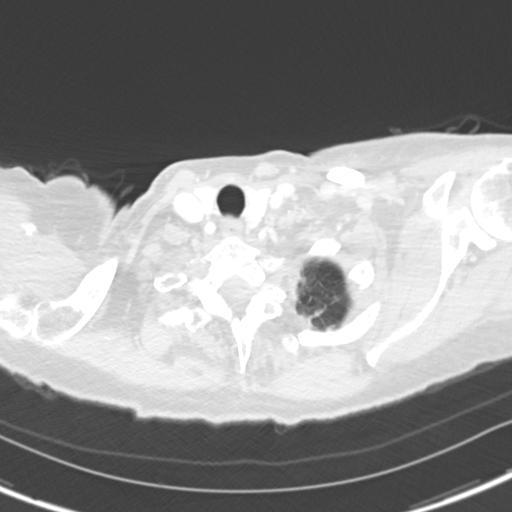

[Series 5: coronal · coronal · 0.54mm/px · 3 of 139 slices shown]
[im 28/139  lung]
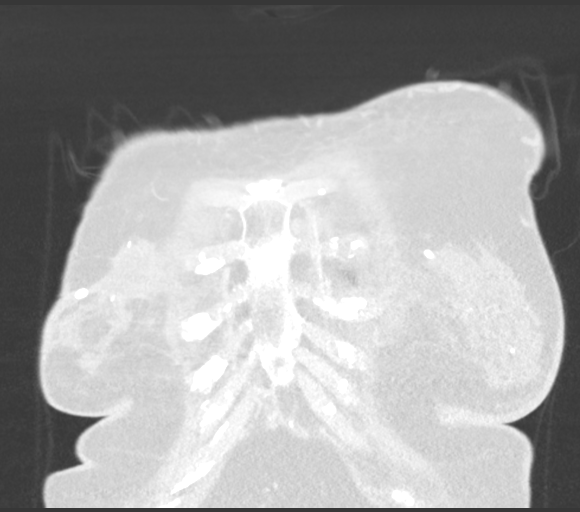
[im 56/139  lung]
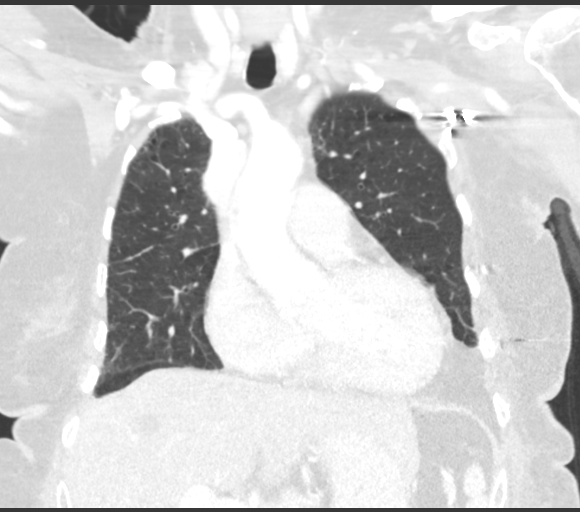
[im 83/139  lung]
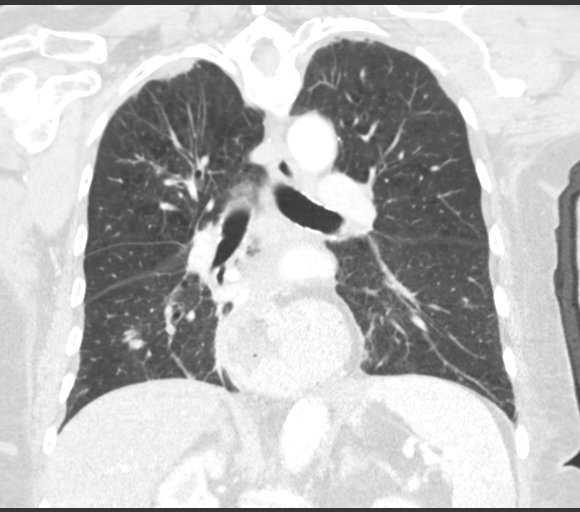

[14 of 36 positions shown; findings below may reference images not displayed]

FINDINGS: Cardiovascular: Heart is normal in size. No pericardial effusion.

No evidence of thoracic aortic aneurysm. Atherosclerotic
calcifications of the aortic arch.

Mild three-vessel coronary atherosclerosis.

Mediastinum/Nodes: Mild thoracic lymphadenopathy, including a 13 mm
short axis AP window node (series 2/image 46), a 12 mm short axis
right hilar node (series 2/image 59), and a 13 mm short axis
subcarinal node (series 2/image 60), suspicious.

Status post left axillary lymph node dissection.

3.1 x 5.1 cm left subpectoral chest wall mass (series 2/image 26).
Extension anteriorly/superiorly (series 2/image 19). Associated 7 mm
short axis left supraclavicular node (series 2/image 12).

Lungs/Pleura: Biapical pleural-parenchymal scarring. Radiation
changes at the right lung apex and in the anterior right upper lobe.

Mild subpleural reticulation/fibrosis in the lungs bilaterally,
lower lobe predominant.

Mild centrilobular and paraseptal emphysematous changes, upper lung
predominant.

5 mm triangular subpleural nodule in the anterior right middle lobe
along the minor fissure (series 7/image 32), unchanged since 3925,
benign. Additional 4 mm triangular subpleural nodule in the anterior
right middle lobe (series 7/image 89), unchanged, benign.

3 mm right lower lobe nodule (series 7/image 108), possibly new. 4 x
6 mm nodule in the lateral right lower lobe (series 7/image 33) and
3 mm nodule in the lateral right lower lobe (series 7/image 87),
new.

Irregular/linear nodular opacity in the right upper lobe (series
7/image 45), new, although favoring mucous plugging.

No focal consolidation.

No pleural effusion or pneumothorax.

Upper Abdomen: Visualized upper abdomen is notable for scattered
hepatic cysts, a moderate hiatal hernia, and vascular
calcifications.

Musculoskeletal: Postsurgical changes related to prior lumpectomy in
the medial right breast (series 2/image 78). Skin thickening
overlying the left breast (series 2/image 100). Mild degenerative
changes of the mid thoracic spine.
IMPRESSION: 3.1 x 5.1 cm left subpectoral chest wall mass, compatible with
recurrence.

Associated thoracic nodal metastases, as described above.

Two small right lower lobe nodules are indeterminate although not
overly suspicious for metastatic disease. Additional coronary
nodules are likely benign.

Additional postsurgical and post radiation changes, as above.

Aortic Atherosclerosis (22XBP-NVQ.Q) and Emphysema (22XBP-IHN.O).

## 2020-11-25 NOTE — Assessment & Plan Note (Addendum)
Quit smoking 01/2016 - PFT's  08/27/2016  FEV1 1.83 (86 % ) ratio 63  p 2 % improvement from saba p nothing prior to study with DLCO  43/45c % corrects to 60  % for alv volume  - 08/27/2016   Walked RA  2 laps @ 185 ft each stopped due to legs gave out walking fast pace, no sob or desat  - 08/27/2016  After extensive coaching device  effectiveness =    75% > try spiriva respimat 2.5 mg x 2 pffs each am - 12/02/2018  C/o dry mouth so rec taper off spiriva to see if affects doe or mouth and if can tolerate one puff spiriva  (but not zero due to doe)  then options are one puff spiriva or one stiolto daily - 11/27/2019  After extensive coaching inhaler device,  effectiveness =    90% with elipta so try anoro one click each am due to mmrc2 doe and cost of spiriva  > preferred spiriva respimat  Doing fine back on spiriva with cost still a major concern so will not escalate to stiolto though techically she is group B and most approp for LAMA/ LABA which can change to using same smi format if losing ground.  >>> f/u q 12 m, sooner prn          Each maintenance medication was reviewed in detail including emphasizing most importantly the difference between maintenance and prns and under what circumstances the prns are to be triggered using an action plan format where appropriate.  Total time for H and P, chart review, counseling, reviewing smi device(s) and generating customized AVS unique to this office visit / same day charting = 25 min

## 2020-11-25 NOTE — Assessment & Plan Note (Addendum)
May 2022 assoc with refractory congestion esp on L side  - Sinus CT 11/25/2020 >>>

## 2020-11-25 NOTE — Patient Instructions (Signed)
No change in your medication   We will call you to schedule a sinus CT and call you with the results   Follow up here can be yearly

## 2020-11-25 NOTE — Progress Notes (Signed)
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 Cathlean [name] for eval of abnormal PFT. Pt c/o SOB for the past year, progressively getting worse. She states she tires very easily and her exercise tolerance has gone downhill.  She has rx for albuterol inhaler, but never had it filled.   indolent onset doe x 2012 worse since summer 2017 but since then about the same  Doe MMRC1 = can walk nl pace, flat grade, can't hurry or go uphills or steps s sob   Was walking 18 min / mile prior to 23 min mile now  Taking prilosec at least one daily right before bfast for overt HB Was prescribed inhalers but never filled rx or tried one of any kind rec Spiriva 2 puffs each am x 2 week sample and if your ex tolerance is better, stay on it indefinitely  Work on inhaler technique:   You have mild copd (GOLD I)      11/27/2019  f/u ov/Stacey [name] re: GOLD I   On spiriva rx for met breast ca > fatigue more than limiting sob Chief Complaint  Patient presents with   Follow-up    Denies any problems with breathing  Dyspnea:  Still MMRC2 = can't walk a nl pace on a flat grade s sob but does fine slow and flat  Cough: none  Sleeping: flat bed one pillow  SABA use: none  02: none  Rec Try anoro one click each(turn the number opposite where you can't see the number) - take two good drags from one click - rinse and gargle after      11/25/2020  f/u ov/Stacey [name] re: GOLD 1  copd/  maint on  spiriva  smi  Chief Complaint  Patient presents with   Follow-up    SOB has been the same, no new concerns  Dyspnea:  MMRC2 = can't walk a nl pace on a flat grade s sob but does fine slow and flat eg  Cough:  sensation pnds still green but lighter x May 2022 and perinasal discomfort L > R maxillary areas Sleeping: prone, flat  SABA use: spiriva/ no saba  02: none Covid status:   vax x 5   No obvious day to day or daytime variability or assoc   mucus plugs or hemoptysis or cp or chest tightness, subjective wheeze or overt  hb symptoms.    Also denies any obvious fluctuation of symptoms with weather or environmental changes or other aggravating or alleviating factors except as outlined above   No unusual exposure hx or h/o childhood pna/ asthma or knowledge of premature birth.  Current Allergies, Complete Past Medical History, Past Surgical History, Family History, and Social History were reviewed in [employer] record.  ROS  The following are not active complaints unless bolded Hoarseness, sore throat, dysphagia, dental  problems, itching, sneezing,  nasal congestion or discharge of excess mucus or purulent secretions, ear ache,   fever, chills, sweats, unintended wt loss or wt gain, classically pleuritic or exertional cp,  orthopnea pnd or arm/hand swelling  or leg swelling, presyncope, palpitations, abdominal pain, anorexia, nausea, vomiting, diarrhea  or change in bowel habits or change in bladder habits, change in stools or change in urine, dysuria, hematuria,  rash, arthralgias, visual complaints, headache, numbness, weakness or ataxia or problems with walking or coordination,  change in mood or  memory.        Current Meds  Medication Sig   anastrozole (ARIMIDEX) 1 MG tablet TAKE 1 TABLET(1 MG) BY MOUTH DAILY   aspirin 81 MG EC tablet Take 81 mg by mouth daily.   calcium-vitamin D (OSCAL WITH D) 500-200 MG-UNIT per tablet Take 1 tablet by mouth daily.    cholecalciferol (VITAMIN D) 1000 UNITS tablet Take 1,000 Units by mouth daily.   cholestyramine (QUESTRAN) 4 g packet MIX AND DRINK 1 PACKET(4 GRAMS) BY MOUTH THREE TIMES DAILY   clotrimazole-betamethasone (LOTRISONE) cream Apply 1 application topically 2 (two) times daily as needed.   diphenhydrAMINE (BENADRYL) 25 MG tablet Take 25 mg by mouth every 6 (six) hours as needed (for bee stings).    fulvestrant (FASLODEX) 250 MG/5ML injection Inject 250 mg into the muscle once. One injection each buttock over 1-2 minutes. Warm prior to use.   loperamide (IMODIUM) 2 MG capsule Take 2 tablets (4 mg) by mouth for first loose stool, then 1 tablet (2 mg) for every loose stool thereafter. Do not exceed 8 tablets (16 mg) in a 24 hour period. Stop loperamide if 12 hours have passed without a loose stool.   loratadine (CLARITIN) 10 MG tablet Take 10 mg by mouth daily.   Magnesium Oxide 250 MG TABS Take 500 mg by mouth once daily. Administer (preferably with food) at least 2 hours apart from other medications.   Multiple Vitamin (MULTIVITAMIN) capsule Take 1 capsule by mouth daily.   omeprazole (PRILOSEC) 20 MG capsule TAKE 1 CAPSULE(20 MG) BY MOUTH DAILY   palbociclib (IBRANCE) 100 MG tablet Take 1 tablet (100 mg total) by mouth Monday through Friday for 3 weeks, then one week off, then repeat   prochlorperazine (COMPAZINE) 10 MG tablet Take 1 tablet (10 mg total) by mouth every 6 (six) hours as needed for nausea or vomiting.   pseudoephedrine (SUDAFED) 30 MG tablet Take 30 mg by mouth every 4 (four) hours as needed for congestion.   telmisartan (MICARDIS) 40 MG tablet TAKE 1 TABLET(40 MG) BY MOUTH DAILY   Tiotropium Bromide Monohydrate (SPIRIVA RESPIMAT) 2.5 MCG/ACT AERS Inhale 2 puffs into the lungs daily.   triamcinolone (NASACORT) 55 MCG/ACT AERO nasal inhaler Place 2 sprays into the nose daily. 2 sprays each nostril at night before bedtime   triamterene-hydrochlorothiazide (DYAZIDE) 37.5-25 MG capsule Take 1  each (1 capsule total) by mouth in the morning.   zolpidem (AMBIEN) 10 MG tablet TAKE 1 TABLET(10 MG) BY MOUTH AT BEDTIME AS NEEDED                      Objective:   Physical Exam       11/25/2020      146   11/27/2019      163  12/02/2018      170 12/01/2017      171   08/27/16 157 lb (71.2 kg)  05/14/16 163 lb (73.9 kg)  01/14/16 160 lb (72.6 kg)    Vital signs reviewed  11/25/2020  - Note at rest 02 sats  96% on RA    General appearance:    amb wf nad       HEENT : pt wearing mask not removed for exam due to covid - 19 concerns.    NECK :  without JVD/Nodes/TM/ nl carotid upstrokes bilaterally   LUNGS: no acc muscle use,  Mild barrel  contour chest wall with bilateral  Distant bs s audible wheeze and  without cough on insp or exp maneuvers  and mild  Hyperresonant  to  percussion bilaterally     CV:  RRR  no s3 or murmur or increase in P2, and no edema   ABD:  soft and nontender with pos end  insp Hoover's  in the supine position. No bruits or organomegaly appreciated, bowel sounds nl  MS:   Nl gait/  ext warm without deformities, calf tenderness, cyanosis or clubbing No obvious joint restrictions   SKIN: warm and dry without lesions    NEURO:  alert, approp, nl sensorium with  no motor or cerebellar deficits apparent.              I personally reviewed images and agree with radiology impression as follows:   Chest CT 09/10/19  1. Stable CT appearance of the chest. No new or progressive findings are demonstrated. 2. Stable underlying emphysematous changes and pulmonary scarring. 3. Persistent patchy tree-in-bud opacities suggesting chronic inflammation or atypical infection such as MAC. 4. Stable ill-defined soft tissue mass surrounding the left brachial praxis less structures. 5. Stable large hiatal hernia.  Assessment:

## 2020-11-26 ENCOUNTER — Ambulatory Visit: Payer: Medicare Other | Admitting: Internal Medicine

## 2020-11-27 ENCOUNTER — Ambulatory Visit (INDEPENDENT_AMBULATORY_CARE_PROVIDER_SITE_OTHER): Payer: Medicare Other | Admitting: *Deleted

## 2020-11-27 DIAGNOSIS — I10 Essential (primary) hypertension: Secondary | ICD-10-CM

## 2020-11-27 DIAGNOSIS — Z170 Estrogen receptor positive status [ER+]: Secondary | ICD-10-CM

## 2020-11-27 NOTE — Chronic Care Management (AMB) (Signed)
Chronic Care Management   CCM RN Visit Note  11/27/2020 Name: Stacey [name] MRN: [id number] DOB: 26-Jan-1941  Subjective: Stacey [name] is a 80 y.o. year old female who is a primary care patient of Biagio Borg, MD. The care management team was consulted for assistance with disease management and care coordination needs.    Engaged with patient by telephone for follow up visit in response to provider referral for case management and/or care coordination services.   Consent to Services:  The patient was given information about Chronic Care Management services, agreed to services, and gave verbal consent prior to initiation of services.  Please see initial visit note for detailed documentation.  Patient agreed to services and verbal consent obtained.   Assessment: Review of patient past medical history, allergies, medications, health status, including review of consultants reports, laboratory and other test data, was performed as part of comprehensive evaluation and provision of chronic care management services.   CCM Care Plan Allergies  Allergen Reactions   Aminoglycosides    Bee Venom Swelling   Clindamycin/Lincomycin Diarrhea and Nausea And Vomiting   Codeine Nausea And Vomiting   Outpatient Encounter Medications as of 11/27/2020  Medication Sig   anastrozole (ARIMIDEX) 1 MG tablet TAKE 1 TABLET(1 MG) BY MOUTH DAILY   aspirin 81 MG EC tablet Take 81 mg by mouth daily.   calcium-vitamin D (OSCAL WITH D) 500-200 MG-UNIT per tablet Take 1 tablet by mouth daily.   cholecalciferol (VITAMIN D) 1000 UNITS tablet Take 1,000 Units by mouth daily.   cholestyramine (QUESTRAN) 4 g packet MIX AND DRINK 1 PACKET(4 GRAMS) BY MOUTH THREE TIMES DAILY   clotrimazole-betamethasone (LOTRISONE) cream Apply 1 application topically 2 (two) times daily as needed.   diclofenac sodium (VOLTAREN) 1 % GEL Apply 4 g topically 4 (four) times daily as needed. (Patient not taking: No sig reported)    diphenhydrAMINE (BENADRYL) 25 MG tablet Take 25 mg by mouth every 6 (six) hours as needed (for bee stings).    fluticasone (FLONASE) 50 MCG/ACT nasal spray Place 2 sprays into both nostrils daily. (Patient not taking: No sig reported)   fulvestrant (FASLODEX) 250 MG/5ML injection Inject 250 mg into the muscle once. One injection each buttock over 1-2 minutes. Warm prior to use.   loperamide (IMODIUM) 2 MG capsule Take 2 tablets (4 mg) by mouth for first loose stool, then 1 tablet (2 mg) for every loose stool thereafter. Do not exceed 8 tablets (16 mg) in a 24 hour period. Stop loperamide if 12 hours have passed without a loose stool.   loratadine (CLARITIN) 10 MG tablet Take 10 mg by mouth daily.   Magnesium Oxide 250 MG TABS Take 500 mg by mouth once daily. Administer (preferably with food) at least 2 hours apart from other medications.   Multiple Vitamin (MULTIVITAMIN) capsule Take 1 capsule by mouth daily.   omeprazole (PRILOSEC) 20 MG capsule TAKE 1 CAPSULE(20 MG) BY MOUTH DAILY   palbociclib (IBRANCE) 100 MG tablet Take 1 tablet (100 mg total) by mouth Monday through Friday for 3 weeks, then one week off, then repeat   prochlorperazine (COMPAZINE) 10 MG tablet Take 1 tablet (10 mg total) by mouth every 6 (six) hours as needed for nausea or vomiting.   pseudoephedrine (SUDAFED) 30 MG tablet Take 30 mg by mouth every 4 (four) hours as needed for congestion.   telmisartan (MICARDIS) 40 MG tablet TAKE 1 TABLET(40 MG) BY MOUTH DAILY   Tiotropium Bromide Monohydrate (SPIRIVA RESPIMAT)  2.5 MCG/ACT AERS Inhale 2 puffs into the lungs daily.   triamcinolone (NASACORT) 55 MCG/ACT AERO nasal inhaler Place 2 sprays into the nose daily. 2 sprays each nostril at night before bedtime   triamterene-hydrochlorothiazide (DYAZIDE) 37.5-25 MG capsule Take 1 each (1 capsule total) by mouth in the morning.   zolpidem (AMBIEN) 10 MG tablet TAKE 1 TABLET(10 MG) BY MOUTH AT BEDTIME AS NEEDED   No facility-administered  encounter medications on file as of 11/27/2020.   Patient Active Problem List   Diagnosis Date Noted   Chronic sinusitis 11/25/2020   Allergic rhinitis 07/08/2020   Hypomagnesemia 06/10/2020   Goals of care, counseling/discussion 06/21/2019   Family history of breast cancer    Family history of pancreatic cancer    Family history of uterine cancer    Family history of lymphoma    Family history of lung cancer    Bone metastases ([place]) 06/02/2019   Recurrent cancer of left breast ([place]) 05/15/2019   Pes anserine bursitis 03/31/2019   HTN (hypertension) 02/10/2019   Hyperglycemia 06/21/2018   Left carpal tunnel syndrome 02/22/2018   Rotator cuff arthropathy of left shoulder 09/20/2017   Arthritis of hand 08/23/2017   Pain in right hand 08/23/2017   Cervical radiculitis 04/09/2017   Left shoulder pain 04/09/2017   Dyspnea on exertion 05/14/2016   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   Lymphedema of left arm 01/14/2016   Left arm swelling 12/25/2015   SVT (supraventricular tachycardia) ([place])    Left-sided carotid artery disease ([place]) 03/08/2015   Dizziness 01/24/2015   Urinary frequency 01/13/2015   Dizziness and giddiness 01/09/2015   Gallstones 02/21/2013   Malignant neoplasm of upper-inner quadrant of left breast in female, estrogen receptor positive ([place]) 11/28/2012   Muscle cramping 09/12/2012   Right hip pain 09/12/2012   Lower back pain 09/12/2012   COPD GOLD I     Hx of radiation therapy    Right shoulder pain 09/14/2011   Preventative health care 07/29/2010   Skin lesion of left leg 07/29/2010   Paresthesia 07/29/2010   GERD 03/28/2010   CONSTIPATION 03/28/2010   Osteoarthrosis, 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   Anxiety state 05/03/2009   Hyperlipidemia 06/14/2007   FATIGUE 06/14/2007   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   DIVERTICULOSIS, COLON 12/17/2006   Cough 12/17/2006   IRRITABLE BOWEL SYNDROME, HX OF 12/17/2006   Conditions to be  addressed/monitored:  HTN and cancer  Care Plan : RN Care Manager Plan of Care  Updates made by Knox Royalty, RN since 11/27/2020 12:00 AM     Problem: Chronic disease management needs   Priority: Medium     Long-Range Goal: Development of plan of care for long term disease management   Start Date: 09/05/2020  Expected End Date: 09/05/2021  Priority: Medium  Note:   Current Barriers:  Chronic Disease Management support and education needs related to HTN and Cancer  RNCM Clinical Goal(s):  Patient will demonstrate ongoing adherence to prescribed treatment plan for HTN and Cancer as evidenced by adherence to prescribed medication regimen contacting provider for new or worsened symptoms or questions attending all provider appointments    Interventions: 1:1 collaboration with primary care provider regarding development and update of comprehensive plan of care as evidenced by provider attestation and co-signature Inter-disciplinary care team collaboration (see longitudinal plan of care) Evaluation of current treatment plan related to  self management and patient's adherence to plan as established by provider;  Hypertension: (Status: Goal  on track: YES.) Last practice recorded BP readings:  BP Readings from Last 3 Encounters:  08/29/20 (!) 138/57  08/01/20 129/66  07/08/20 110/80  Most recent eGFR/CrCl:  Lab Results  Component Value Date   EGFR 69 (L) 12/09/2015    No components found for: CRCL  Evaluation of current treatment plan related to hypertension self management and patient's adherence to plan as established by provider;   Reviewed prescribed diet heart healthy, low salt, DASH diet Discussed plans with patient for ongoing care management follow up and provided patient with direct contact information for care management team; Discussed complications of poorly controlled blood pressure such as heart disease, stroke, circulatory complications, vision complications, kidney  impairment, sexual dysfunction;  Confirmed patient does not routinely monitor/ record blood pressures at home, but tracks through medical office visits: reports "blood pressures have all been good;" reports last blood pressure this week as "124/72" during pulmonary provider office visit Reviewed recent provider office visits: ENT- 10/14/20; oncology- 10/24/20; pulmonary 11/25/20- patient verbalizes good understanding of post-visit instructions/ plan Confirmed no recent changes to medications; patient continues taking medications independently and denies concerns around medications Confirmed patient obtained flu vaccine and COVID booster "last week;" positive reinforcement provided  Breast CA  (Status: Goal on track: YES.) Evaluation of current treatment plan related to  cancer ,     self-management and patient's adherence to plan as established by provider. Discussed plans with patient for ongoing care management follow up and provided patient with direct contact information for care management team Reviewed scheduled/upcoming provider appointments including CT (maxillofacial) scan 12/03/20; oncology provider 12/17/20; PCP 01/07/21; Discussed plan of care for ongoing treatment breast CA: reports "everything is stable for now;" (L) arm lymphedema persists; reports new arm sleev that oncologist recommended "seems to be helping;" oncology work up revealed "Traumatized ribs" per patient report Confirmed appetite "good;" continues trying to follow "overall healthy diet"  Patient Goals/Self-Care Activities: Patient will self administer medications as prescribed Patient will attend all scheduled provider appointments Patient will call pharmacy for medication refills Patient will call provider office for new concerns or questions Patient will continue to follow heart healthy, low salt diet        Plan: Telephone follow up appointment with care management team member scheduled for:  Tuesday, February 26, 2020 at 2:00 pm The patient has been provided with contact information for the care management team and has been advised to call with any health related questions or concerns  [name], RN, BSN, [place] [phone]: direct office [phone]: mobile

## 2020-11-27 NOTE — Patient Instructions (Signed)
Visit Information  Stacey [name], it was nice talking with you today.    I look forward to talking to you again for an update on Tuesday, February 26, 2020 at 2:00 pm- please be listening out for my call that day.  I will call as close to 2:00 pm as possible.   If you need to cancel or re-schedule our telephone visit, please call [phone] and one of our care guides will be happy to assist you.   I look forward to hearing about your progress.   Please don't hesitate to contact me if I can be of assistance to you before our next scheduled telephone appointment.   Stacey Rack, RN, BSN, [facility] RN Care Coordination- [place] [phone]: direct office [phone]: mobile   PATIENT GOALS:  Goals Addressed             This Visit's Progress    Patient Self-Care Activities   On track    Timeframe:  Long-Range Goal Priority:  Medium Start Date:         8/0/4/22                    Expected End Date:       09/05/21                Patient will self administer medications as prescribed Patient will attend all scheduled provider appointments Patient will call pharmacy for medication refills Patient will call provider office for new concerns or questions Patient will continue to follow heart healthy, low salt diet       Living With Chronic Cancer Chronic cancer refers to cancers that are not cured with treatment but can be controlled for months or years with treatment. The goal of treatment with chronic cancer is to help you maintain the best quality of life possible for as long as possible. The unknowns of living with chronic cancer can be challenging. Finding healthy ways to manage your emotions will help you get through difficult times. How to manage lifestyle changes Managing stress Finding healthy ways to take care of yourself can reduce stress and anxiety. This can make a difficult situation feel easier to handle. Try some of these suggestions: Do  a relaxing activity that you enjoy, such as drawing, painting, listening to music, reading, or meditating. Stay social and active, if you feel up to it. Get regular exercise. Consider trying a type of gentle mind-body exercise, such as yoga or tai chi.  Relationships Stay connected to the people in your life. You could schedule a regular day to spend time together, or you could stay in touch by phone or online. Other ways to stay connected include: Planning a social activity each week, depending on how you feel. Making an effort to tell others about the good parts of your day, as well as the difficult parts. Exchanging entries in a common journal together. It may be easier to communicate about difficult topics by writing them down instead of saying them out loud. Texting, emailing, or video chatting with your friends and family members when you are not able to spend time together in person. Setting up a social media site where you can share news and where others can get updates and send you messages. General instructions  You may need to try different things until you find what works for you. You can start by: Letting the people in your life know how you feel. Keeping a  journal to help you sort through your thoughts and emotions. Sometimes writing them down can make you feel better. Joining a support group for people who are going through the same thing as you. You may make new friends and get good information and support. It is normal to have many feelings during this time. If you have feelings of overwhelming sadness for longer than 2 weeks, let your health care provider know. You may benefit from visiting with a mental health professional. How to recognize stress Everyone is likely to feel stress while living with cancer. Be aware of the following symptoms of stress. These may include: Emotional symptoms, such as: Depression. Anxiety. Extreme anger. Relationship and behavior symptoms, such  as: Isolating yourself. Fighting with others over small things. Avoiding important relationships. Follow these instructions at home: Lifestyle Do not drink alcohol. Do not use any products that contain nicotine or tobacco, such as cigarettes, e-cigarettes, and chewing tobacco. If you need help quitting, ask your health care provider. Do your best to get enough sleep. Limit naps to short periods throughout the day. Try to get in regular, gentle exercise each day. General instructions  Drink enough fluid to keep your urine pale yellow. Eat plenty of healthy foods, such as lean meats, whole grains, fruits, and vegetables. Consider meeting with a dietitian to talk about what you should eat and drink. Take over-the-counter and prescription medicines only as told by your health care provider. Keep all follow-up visits as told by your health care provider. This is important. Where to find support Talking to others Decide how much you want to share with others about what you are going through. Explain that sometimes you might want to talk about what you are going through, and other times you might want to focus on other things besides cancer. It is important to know that you are not alone. Consider talking with: A mental health professional or therapist who specializes in dealing with cancer. Family members. Close friends. A member of your church, faith, or community group.  Therapy and support groups Consider joining a support group. If you can, make friends who are going through a similar experience. Someone who is able to really understand what you are going through can be a good source of support and help you stay positive. If there is not a group near you, consider an online support group. Ask for support and resources from your cancer care team and caregivers. Other ways to find support Consider asking a friend or caregiver to help with errands such as grocery shopping, cleaning, and  driving you to your appointments. If you are able, consider paying for these services if you do not have people to help you. Contact your local home care agencies, community agencies, and social agencies for help. Ask to meet with a palliative care specialist for help treating your pain and other symptoms. Palliative care can help you manage symptoms, promote comfort, improve quality of life, and maintain dignity. Palliative care may be offered during any phase of a cancer diagnosis. Where to find more information American Cancer Society: www.cancer.[place]: www.cancer.gov Contact a health care provider if you: Are feeling overwhelmed with your emotions. Recognize that your stress is getting to be too much to handle on your own. Are feeling depressed. Get help right away if you: Have pain that is not controlled well with medicine. Are thinking about hurting yourself or someone else. Are feeling hopeless about treatment for the cancer, and you just  want to give up. If you ever feel like you may hurt yourself or others, or have thoughts about taking your own life, get help right away. Go to your nearest emergency department or: Call your local emergency services (911 in the [place] Call a suicide crisis helpline, such as the [place] at 8067505297. This is open 24 hours a day in the [place] Text the Crisis Text Line at (681)870-2117 (in the [place].). Summary Chronic cancer refers to cancers that are not cured with treatment but can be controlled for months or years with treatment. The unknowns of living with chronic cancer can be challenging. Finding healthy ways to manage your emotions will help you get through difficult times. Ask for support and resources from your cancer care team and caregivers. Ask to meet with a palliative care specialist for help treating your pain and other symptoms. Palliative care may be offered during any phase of a cancer  diagnosis. This information is not intended to replace advice given to you by your health care provider. Make sure you discuss any questions you have with your health care provider. Document Revised: 02/22/2019 Document Reviewed: 02/22/2019 Elsevier Patient Education  2022 [place].   Patient verbalizes understanding of instructions provided today and agrees to view in MyChart Telephone follow up appointment with care management team member scheduled for:  Tuesday, February 26, 2020 at 2:00 pm The patient has been provided with contact information for the care management team and has been advised to call with any health related questions or concerns  Stacey Rack, RN, BSN, [place] [phone]: direct office [phone]: mobile

## 2020-12-02 DIAGNOSIS — C50212 Malignant neoplasm of upper-inner quadrant of left female breast: Secondary | ICD-10-CM | POA: Diagnosis not present

## 2020-12-02 DIAGNOSIS — I10 Essential (primary) hypertension: Secondary | ICD-10-CM

## 2020-12-02 DIAGNOSIS — Z170 Estrogen receptor positive status [ER+]: Secondary | ICD-10-CM | POA: Diagnosis not present

## 2020-12-03 ENCOUNTER — Telehealth: Payer: Self-pay

## 2020-12-03 ENCOUNTER — Ambulatory Visit (HOSPITAL_COMMUNITY)
Admission: RE | Admit: 2020-12-03 | Discharge: 2020-12-03 | Disposition: A | Discharge status: Home / Self Care | Payer: Medicare Other | Source: Ambulatory Visit | Attending: Internal Medicine | Admitting: Internal Medicine

## 2020-12-03 ENCOUNTER — Other Ambulatory Visit: Payer: Self-pay

## 2020-12-03 DIAGNOSIS — J329 Chronic sinusitis, unspecified: Secondary | ICD-10-CM | POA: Diagnosis not present

## 2020-12-03 DIAGNOSIS — J3489 Other specified disorders of nose and nasal sinuses: Secondary | ICD-10-CM | POA: Diagnosis not present

## 2020-12-03 DIAGNOSIS — Z853 Personal history of malignant neoplasm of breast: Secondary | ICD-10-CM | POA: Diagnosis not present

## 2020-12-03 DIAGNOSIS — J320 Chronic maxillary sinusitis: Secondary | ICD-10-CM | POA: Diagnosis not present

## 2020-12-03 NOTE — Telephone Encounter (Signed)
Oral Oncology Patient Advocate Encounter  Met patient in [place] to complete re-enrollment application for [facility] in an effort to reduce patient's out of pocket expense for [facility] to $0.    Application completed and faxed to [phone].   [employer] patient assistance phone number for follow up is [phone].   This encounter will be updated until final determination.   [place] Patient [place] Phone [phone] Fax [phone] 12/03/2020 4:02 PM

## 2020-12-10 ENCOUNTER — Encounter: Payer: Self-pay | Admitting: Internal Medicine

## 2020-12-11 ENCOUNTER — Other Ambulatory Visit: Payer: Self-pay

## 2020-12-11 MED ORDER — OMEPRAZOLE 20 MG PO CPDR
DELAYED_RELEASE_CAPSULE | ORAL | 2 refills | Status: DC
Start: 2020-12-11 — End: 2021-09-04

## 2020-12-11 NOTE — Progress Notes (Signed)
LMTCB

## 2020-12-16 ENCOUNTER — Telehealth: Payer: Self-pay | Admitting: Internal Medicine

## 2020-12-16 ENCOUNTER — Other Ambulatory Visit: Payer: Self-pay | Admitting: *Deleted

## 2020-12-16 DIAGNOSIS — J329 Chronic sinusitis, unspecified: Secondary | ICD-10-CM

## 2020-12-16 NOTE — Progress Notes (Signed)
Patient returned call, provided results/recommendations per Dr. Melvyn Novas.  She verbalized understanding.  Referral sent to ENT.  Nothing further needed.

## 2020-12-16 NOTE — Progress Notes (Signed)
ID: [name]   DOB: 02-22-1940  MR#: [id number]  IHK#:[id number]  Patient Care Team: Biagio Borg, MD as PCP - General (Internal Medicine) Tyler Pita, MD as Consulting Physician (Radiation Oncology) Roseanne Kaufman, MD as Consulting Physician (Orthopedic Surgery) Lyndal Pulley, DO as Consulting Physician (Family Medicine) Kaylianna Detert, Virgie Dad, MD as Consulting Physician (Oncology) Monna Fam, MD as Consulting Physician (Ophthalmology) Knox Royalty, RN as Case Manager Raina Mina, RPH-CPP (Pharmacist) OTHER MD:   CHIEF COMPLAINT: metastatic breast cancer  CURRENT TREATMENT: anastrozole; palbociclib/Ibrance; fulvestrant   INTERVAL HISTORY:   Stacey [name] returns today for follow up of her metastatic breast cancer.  Since her last visit, she underwent bone scan on 11/11/2020 showing: extremely faint uptake within known sclerotic metastases within the left scapula; no evidence of additional osseous metastatic disease.  Of note, she also underwent maxillofacial CT on 12/03/2020 showing: severe mucosal thickening in left maxillary sinus with air-fluid levels and subtle osseous erosions of lateral and medial maxillary sinus walls, consistent with chronic sinusitis, possibly fungal in etiology given osseous erosions.  She continues on anastrozole, which she started 06/02/2019.  She is tolerating this well.  She continues on palbociclib, currently at 133m Monday through Friday (not Saturday or Sunday); three weeks on and one week off. She is having no side effects from this that she is aware of.  She also receives fulvestrant, every 4 weeks.  She tolerates the injections without any difficulty.  She had been receiving denosumab/Xgeva every 12 weeks, most recently on 09/26/2020.  We are holding this because of extensive ongoing dental work but she tells me after her most recent implant "fell out" no more dental work is planned by her oral surgeon [facility] not because she does  not need it but because he is reluctant to operate after having been exposed to denosumab so long..    We are following her CA 27-29:  Lab Results  Component Value Date   CA2729 43.5 (H) 11/21/2020   CA2729 40.2 (H) 10/24/2020   CA2729 49.3 (H) 09/16/2020   CA2729 47.2 (H) 08/29/2020   CA2729 50.8 (H) 08/01/2020    REVIEW OF SYSTEMS: GTejacontinues to have significant oral problems.  One of her 10 implants fell out.  She says she does not have enough teeth to create a bridge.  At present she is able to chew with the left side of her mouth but this is going to be an issue for her at some point in the future.  Otherwise a detailed review of systems today was stable   COVID 19 VACCINATION STATUS: Fully vaccinated with booster in September 2021   HISTORY OF PRESENT ILLNESS:  From the original intake note:  " Stacey Gaganis a 80year old [name], who has a history of breast cancer starting in 1997 and in 2013.    On 11/22/95, she underwent a left breast biopsy, which revealed malignant cells.  She had a left breast lumpectomy with re-excision on 11/29/95, with final pathology revealed IDC, grade 2, 2.5 cm, ER 98%, PR 97%, Ki67 8% with one of 15 nodes being positive.  She received chemotherapy, 4 cycles per patient, along with radiation and took Tamoxifen for 7 years following radiation completion.  Records from 1997 limited.    In October 2012, she had a biopsy of the right breast after mammography recommended additional images for a dense area in the right breast.  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  On 11/25/10, she had an MRI that measured the area in the right breast as 2.2 cm.  She started neoadjuvant [name] in November 2012 and has continued it since.  The area of concern measured 1.7 cm on a repeat MRI in March of 2013.  On 09/03/11, she underwent a  right lumpectomy with sentinel lymph node biopsy with final pathology resulting invasive lobular carcinoma with calcifications, grade 2, 2.5 cm wit lobular carcinoma in situ.  Surgical margins were negative with one of one sentinel lymph node positive for metastatic carcinoma.  She then underwent radiation therapy under the care of Dr. [name] from 10/19/11 to 12/03/11. "   Her subsequent history is as detailed below   PAST MEDICAL HISTORY: Past Medical History:  Diagnosis Date   ANEMIA-NOS    ANXIETY    Breast cancer ([place]) 1997 L, 2012 R   s/p chemo/xrt   COPD    resolved   DIVERTICULOSIS, COLON 2008   Dizziness    Family history of breast cancer    Family history of lung cancer    Family history of lymphoma    Family history of pancreatic cancer    Family history of uterine cancer    GERD    Hx of radiation therapy 10/19/11 -12/03/11   right breast   HYPERLIPIDEMIA    IRRITABLE BOWEL SYNDROME, HX OF    Left-sided carotid artery disease (HCC)    moderate left ICA stenosis   OSTEOARTHRITIS, HAND    PSVT (paroxysmal supraventricular tachycardia) ([place])    symptomatic on event monitor    PAST SURGICAL HISTORY: Past Surgical History:  Procedure Laterality Date   ABDOMINAL HYSTERECTOMY     APPENDECTOMY     BREAST BIOPSY  11/14/10    r breast: inv, insitu mammary carcinoma w/calcif, er/pr +, her2 -   BREAST SURGERY     lumpectomy   CATARACT EXTRACTION     both eyes   ELECTROPHYSIOLOGIC STUDY N/A 05/10/2015   Procedure: SVT Ablation;  Surgeon: Will Meredith Leeds, MD;  Location: [facility] [place];  Service: Cardiovascular;  Laterality: N/A;   HERNIA REPAIR     inguinal herniorrhapy  1984   left   IR US GUIDE BX ASP/DRAIN  05/26/2019   rectal fissure repair     s/p benign breast biopsy  2003   right   s/p left foot surgury  2009   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   spiral fx left foot  2008   no surgury   TMJ ARTHROPLASTY  1989   TONGUE SURGERY     1988- to  remove scar tissue growth    TONSILLECTOMY      FAMILY HISTORY Family History  Problem Relation Age of Onset   Hypertension Mother    Stroke Mother    Colon polyps Mother    Diabetes Mother    Pancreatic cancer Mother 46   Uterine cancer Mother 55   Lymphoma Brother        burkitts   Lung cancer Paternal Uncle    Lung cancer Maternal Grandmother 23       non-smoker   Lung cancer Maternal Grandfather    Breast cancer Cousin        maternal cousin, dx in her mid 25s   Brain cancer Cousin        maternal cousin's  son; dx in his 70s   Testicular cancer Cousin        maternal cousin's son;    Breast cancer Cousin        paternal cousin; dx in her 34s   Breast cancer Cousin        paternal cousin's daughter; dx in 48s; neg genetic testing   Colon cancer Neg Hx    Esophageal cancer Neg Hx    Stomach cancer Neg Hx    Rectal cancer Neg Hx     GYNECOLOGIC HISTORY:   Menarche age 63, GX P0 (one miscarriage at 14 months). Status post vaginal hysterectomy in the late 1970s, no salpingo-oophorectomy    SOCIAL HISTORY:   The patient lives alone and is divorced. She retired from [employer], were she worked in Physiological scientist.   ADVANCED DIRECTIVES:  Living will in place; the patient's healthcare power of attorney is her stepdaughter, Vladimir Faster, who can be reached at [place]: Social History   Tobacco Use   Smoking status: Former    Packs/day: 1.00    Years: 54.00    Pack years: 54.00    Types: Cigarettes    Quit date: 01/03/2016    Years since quitting: 4.9   Smokeless tobacco: Never  Vaping Use   Vaping Use: Never used  Substance Use Topics   Alcohol use: Yes    Comment: rare/ drinks socially   Drug use: No    Allergies  Allergen Reactions   Aminoglycosides    Bee Venom Swelling   Clindamycin/Lincomycin Diarrhea and Nausea And Vomiting   Codeine Nausea And Vomiting    Current Outpatient Medications  Medication Sig Dispense Refill    anastrozole (ARIMIDEX) 1 MG tablet TAKE 1 TABLET(1 MG) BY MOUTH DAILY 90 tablet 4   aspirin 81 MG EC tablet Take 81 mg by mouth daily.     calcium-vitamin D (OSCAL WITH D) 500-200 MG-UNIT per tablet Take 1 tablet by mouth daily.     cholecalciferol (VITAMIN D) 1000 UNITS tablet Take 1,000 Units by mouth daily.     cholestyramine (QUESTRAN) 4 g packet MIX AND DRINK 1 PACKET(4 GRAMS) BY MOUTH THREE TIMES DAILY 60 each 12   clotrimazole-betamethasone (LOTRISONE) cream Apply 1 application topically 2 (two) times daily as needed. 30 g 1   diclofenac sodium (VOLTAREN) 1 % GEL Apply 4 g topically 4 (four) times daily as needed. (Patient not taking: No sig reported) 400 g 11   diphenhydrAMINE (BENADRYL) 25 MG tablet Take 25 mg by mouth every 6 (six) hours as needed (for bee stings).      fluticasone (FLONASE) 50 MCG/ACT nasal spray Place 2 sprays into both nostrils daily. (Patient not taking: No sig reported)     fulvestrant (FASLODEX) 250 MG/5ML injection Inject 250 mg into the muscle once. One injection each buttock over 1-2 minutes. Warm prior to use.     loperamide (IMODIUM) 2 MG capsule Take 2 tablets (4 mg) by mouth for first loose stool, then 1 tablet (2 mg) for every loose stool thereafter. Do not exceed 8 tablets (16 mg) in a 24 hour period. Stop loperamide if 12 hours have passed without a loose stool. 30 capsule    loratadine (CLARITIN) 10 MG tablet Take 10 mg by mouth daily.     Magnesium Oxide 250 MG TABS Take 500 mg by mouth once daily. Administer (preferably with food) at least 2 hours apart from other medications. 30 tablet 0   Multiple Vitamin (  MULTIVITAMIN) capsule Take 1 capsule by mouth daily.     omeprazole (PRILOSEC) 20 MG capsule Take 1 capsule by mouth daily 90 capsule 2   palbociclib (IBRANCE) 100 MG tablet Take 1 tablet (100 mg total) by mouth Monday through Friday for 3 weeks, then one week off, then repeat 15 tablet 6   prochlorperazine (COMPAZINE) 10 MG tablet Take 1 tablet (10  mg total) by mouth every 6 (six) hours as needed for nausea or vomiting. 30 tablet 1   pseudoephedrine (SUDAFED) 30 MG tablet Take 30 mg by mouth every 4 (four) hours as needed for congestion.     telmisartan (MICARDIS) 40 MG tablet TAKE 1 TABLET(40 MG) BY MOUTH DAILY 90 tablet 1   Tiotropium Bromide Monohydrate (SPIRIVA RESPIMAT) 2.5 MCG/ACT AERS Inhale 2 puffs into the lungs daily. 4 g 11   triamcinolone (NASACORT) 55 MCG/ACT AERO nasal inhaler Place 2 sprays into the nose daily. 2 sprays each nostril at night before bedtime 1 each 12   triamterene-hydrochlorothiazide (DYAZIDE) 37.5-25 MG capsule Take 1 each (1 capsule total) by mouth in the morning. 90 capsule 4   zolpidem (AMBIEN) 10 MG tablet TAKE 1 TABLET(10 MG) BY MOUTH AT BEDTIME AS NEEDED 90 tablet 1   No current facility-administered medications for this visit.    OBJECTIVE: White woman who appears younger than stated age 43:   12/17/20 1502  BP: (!) 146/58  Pulse: 89  Resp: 17  Temp: (!) 97.3 F (36.3 C)  SpO2: 100%     Body mass index is 24.91 kg/m.     Sclerae unicteric, EOMs intact Wearing a mask No cervical or supraclavicular adenopathy Lungs no rales or rhonchi Heart regular rate and rhythm Abd soft, nontender, positive bowel sounds MSK no focal spinal tenderness, left compression sleeve in place Neuro: nonfocal, well oriented, appropriate affect Breasts: Deferred   LAB RESULTS: Lab Results  Component Value Date   WBC 4.4 12/17/2020   NEUTROABS 2.8 12/17/2020   HGB 9.1 (L) 12/17/2020   HCT 26.3 (L) 12/17/2020   MCV 107.8 (H) 12/17/2020   PLT 179 12/17/2020      Chemistry      Component Value Date/Time   NA 137 12/17/2020 1436   NA 140 12/09/2015 1409   K 4.0 12/17/2020 1436   K 4.2 12/09/2015 1409   CL 109 12/17/2020 1436   CL 107 02/02/2012 0939   CO2 18 (L) 12/17/2020 1436   CO2 25 12/09/2015 1409   BUN 25 (H) 12/17/2020 1436   BUN 16.7 12/09/2015 1409   CREATININE 1.34 (H) 12/17/2020  1436   CREATININE 1.19 (H) 11/21/2020 1319   CREATININE 0.8 12/09/2015 1409      Component Value Date/Time   CALCIUM 9.1 12/17/2020 1436   CALCIUM 9.4 12/09/2015 1409   ALKPHOS 63 12/17/2020 1436   ALKPHOS 54 12/09/2015 1409   AST 19 12/17/2020 1436   AST 19 11/21/2020 1319   AST 20 12/09/2015 1409   ALT 12 12/17/2020 1436   ALT 13 11/21/2020 1319   ALT 13 12/09/2015 1409   BILITOT <0.2 (L) 12/17/2020 1436   BILITOT 0.3 11/21/2020 1319   BILITOT <0.22 12/09/2015 1409       Lab Results  Component Value Date   LABCA2 56 (H) 12/10/2010    No components found for: [id number]  No results for input(s): INR in the last 168 hours.  Urinalysis    Component Value Date/Time   COLORURINE YELLOW 07/13/2018 [place]  Cloudy (A) 07/13/2018 0938   LABSPEC 1.020 07/13/2018 0938   PHURINE 6.0 07/13/2018 0938   GLUCOSEU NEGATIVE 07/13/2018 0938   HGBUR NEGATIVE 07/13/2018 0938   BILIRUBINUR NEGATIVE 07/13/2018 0938   KETONESUR NEGATIVE 07/13/2018 0938   UROBILINOGEN 0.2 07/13/2018 0938   NITRITE NEGATIVE 07/13/2018 0938   LEUKOCYTESUR TRACE (A) 07/13/2018 0938    STUDIES: CT MAXILLOFACIAL WO CONTRAST  Result Date: 12/04/2020 CLINICAL DATA:  Congestion and cough for 3 months history of breast cancer. EXAM: CT MAXILLOFACIAL WITHOUT CONTRAST TECHNIQUE: Multidetector CT imaging of the maxillofacial structures was performed. Multiplanar CT image reconstructions were also generated. COMPARISON:  None. FINDINGS: Osseous: Subtle focal osseous erosion is noted at the lateral wall of the left maxillary sinus (series 4, image 47). Additional focal osseous erosion is noted at the upper aspect of the medial wall of the left maxillary sinus (series 4, image 50-51). Orbits: Negative. No traumatic or inflammatory finding. Sinuses: Severe mucosal thickening of the left maxillary sinus with air-fluid levels and obstructing the maxillary ostium and infundibulum. Mucosal thickening is also  noted in several bilateral anterior ethmoid air cells and at the left frontal recess. The other paranasal sinuses are clear. Soft tissues: Negative. The retromaxillary fat pads are clear bilaterally. Limited intracranial: No significant or unexpected finding. IMPRESSION: Severe mucosal thickening in the left maxillary sinus with air-fluid levels and subtle osseous erosions of the lateral and medial maxillary sinus walls, consistent with chronic sinusitis, possibly fungal in etiology given osseous erosions. Electronically Signed   By: Ileana Roup M.D.   On: 12/04/2020 11:48      ASSESSMENT: 80 y.o. [place] woman:  LEFT BREAST #1  S/P LEFT breast lumpectomy with re-excision on 11/29/95 for a T2 N1bi stage IIb invasive ductal carcinoma , grade 2, estrogen receptor 98% positive, progesterone receptor 97% positive, Ki67 8%.  #2 status post 4 cycles of doxorubicin and cyclophosphamide,    (i) followed by radiation therapy under the care of Dr. [name].  #3 received tamoxifen for a total of seven years   RIGHT BREAST #4  S/P biopsy of the RIGHT breast upper inner quadrant on 11/14/10 showing invasive ductal carcinoma,, grade 2, estrogen receptor 85% and progesterone receptor 57% positive, Ki67 20%, HER2 not amplified.    #5 started neoadjuvant letrozole in November 2012; switched to tamoxifen as of February 2016 due to osteopenia concerns  #6  S/P right lumpectomy with sentinel lymph node biopsy on 09/03/11 for a ypT2, ypN1a, stage IIB invasive lobular carcinoma, grade 2,estrogen receptor 97% positive, progesterone receptor 12% positive, with no HER-2 amplification.  #7 status post right breast radiation therapy under the care of Dr. [name] from 10/19/2011 to 12/03/2011.   #8 did not meet criteria for genetic testing according to her insurance company.  #9 osteopenia, with a T score of -1.8 on DEXA scan at [facility] 03/07/2013  (a) status post multiple dental extractions and implants  (b) repeat  bone density at [facility] 04/02/2015 shows a T score of -2.0  METASTATIC DISEASE: April 2021 (lymph nodes, bone) #10:  left upper extremity lymphedema led to chest CT scan 05/09/2019 showing a 5.1 cm left subpectoral chest wall mass and thoracic lymphadenopathy   (a) CT biopsy of the chest wall mass 05/25/2019 confirms recurrent breast cancer, strongly estrogen and progesterone receptor positive, HER-2 not amplified  (b) PET scan 05/30/2019 shows a left subpectoral mass measuring 5.4 cm, with significant regional and mediastinal adenopathy, sclerotic left scapular metastasis, but no liver or lung involvement.  (c)  CA 27-29 is moderately informative  #11 anastrozole started 06/02/2019; palbociclib added 06/08/2019  (a) palbociclib taken irregularly for several months secondary to cytopenias  (b) palbociclib dose decreased to 100 mg daily 21 on 7 off October 2021  (c) CT of the chest with contrast 11/09/2019 shows mild disease progression (despite a continuing drop in the CA 27-29)  (d) fulvestrant added 11/23/2019  (e) repeat chest CT with contrast 09/09/2020 read as stable.  #12 denosumab/Xgeva starting 06/08/2019, to be repeated every 3 months due to hypocalcemia  (a) treatment held after August 2022 dose secondary to ongoing dental issues  #13 genetics testing 06/15/2019 through the [facility] Common Hereditary Cancers Panel found no deleterious mutations in APC, ATM, AXIN2, BARD1, BMPR1A, BRCA1, BRCA2, BRIP1, CDH1, CDKN2A (p14ARF), CDKN2A (p16INK4a), CKD4, CHEK2, CTNNA1, DICER1, EPCAM (Deletion/duplication testing only), GREM1 (promoter region deletion/duplication testing only), KIT, MEN1, MLH1, MSH2, MSH3, MSH6, MUTYH, NBN, NF1, NHTL1, PALB2, PDGFRA, PMS2, POLD1, POLE, PTEN, RAD50, RAD51C, RAD51D, RNF43, SDHB, SDHC, SDHD, SMAD4, SMARCA4. STK11, TP53, TSC1, TSC2, and VHL.  The following genes were evaluated for sequence changes only: SDHA and HOXB13 c.251G>A variant only.   PLAN:   Airis is now a  year and a half out from definitive diagnosis of metastatic breast cancer.  Her disease is very well controlled and she has no symptoms directly attributable to it.  She is also tolerating her double antiestrogen treatment well.  Her palbociclib dose is currently 100 mg 5 days out of 7, 3 weeks out of 4, and we have not had to lower the dose further.  At this point I would not plan to resume denosumab or a bisphosphonate until there is complete clearance of her dental problems, which may well take a year.  I am making no other changes in her overall treatment.  She will return in 4 weeks for her next Faslodex dose again in 8 weeks for treatment and a visit.  At that point we could consider setting her up for a repeat chest CT scan sometime in the spring  Total encounter time 25 minutes.[name] C. Terris Bodin, MD 12/17/20 3:22 PM Medical Oncology and Hematology [facility] [place], [place] [address] Tel. [phone]    Fax. [phone]   I, Wilburn Mylar, am acting as scribe for Dr. Virgie Dad. Treyvonne Tata.  I, Lurline Del MD, have reviewed the above documentation for accuracy and completeness, and I agree with the above.   *Total Encounter Time as defined by the Centers for Medicare and Medicaid Services includes, in addition to the face-to-face time of a patient visit (documented in the note above) non-face-to-face time: obtaining and reviewing outside history, ordering and reviewing medications, tests or procedures, care coordination (communications with other health care professionals or caregivers) and documentation in the medical record.

## 2020-12-16 NOTE — Telephone Encounter (Signed)
disregard

## 2020-12-17 ENCOUNTER — Inpatient Hospital Stay: Payer: Medicare Other

## 2020-12-17 ENCOUNTER — Inpatient Hospital Stay: Payer: Medicare Other | Attending: Adult Health | Admitting: Oncology

## 2020-12-17 ENCOUNTER — Other Ambulatory Visit: Payer: Self-pay

## 2020-12-17 VITALS — BP 146/58 | HR 89 | Temp 97.3°F | Resp 17 | Wt 144.0 lb

## 2020-12-17 DIAGNOSIS — R9389 Abnormal findings on diagnostic imaging of other specified body structures: Secondary | ICD-10-CM

## 2020-12-17 DIAGNOSIS — C7951 Secondary malignant neoplasm of bone: Secondary | ICD-10-CM | POA: Diagnosis not present

## 2020-12-17 DIAGNOSIS — Z923 Personal history of irradiation: Secondary | ICD-10-CM | POA: Insufficient documentation

## 2020-12-17 DIAGNOSIS — C773 Secondary and unspecified malignant neoplasm of axilla and upper limb lymph nodes: Secondary | ICD-10-CM | POA: Insufficient documentation

## 2020-12-17 DIAGNOSIS — C50211 Malignant neoplasm of upper-inner quadrant of right female breast: Secondary | ICD-10-CM

## 2020-12-17 DIAGNOSIS — Z853 Personal history of malignant neoplasm of breast: Secondary | ICD-10-CM | POA: Insufficient documentation

## 2020-12-17 DIAGNOSIS — Z87891 Personal history of nicotine dependence: Secondary | ICD-10-CM | POA: Insufficient documentation

## 2020-12-17 DIAGNOSIS — F54 Psychological and behavioral factors associated with disorders or diseases classified elsewhere: Secondary | ICD-10-CM | POA: Diagnosis not present

## 2020-12-17 DIAGNOSIS — C50912 Malignant neoplasm of unspecified site of left female breast: Secondary | ICD-10-CM

## 2020-12-17 DIAGNOSIS — K589 Irritable bowel syndrome without diarrhea: Secondary | ICD-10-CM | POA: Insufficient documentation

## 2020-12-17 DIAGNOSIS — C50212 Malignant neoplasm of upper-inner quadrant of left female breast: Secondary | ICD-10-CM

## 2020-12-17 DIAGNOSIS — Z801 Family history of malignant neoplasm of trachea, bronchus and lung: Secondary | ICD-10-CM | POA: Diagnosis not present

## 2020-12-17 DIAGNOSIS — Z7189 Other specified counseling: Secondary | ICD-10-CM | POA: Diagnosis not present

## 2020-12-17 DIAGNOSIS — Z170 Estrogen receptor positive status [ER+]: Secondary | ICD-10-CM | POA: Diagnosis not present

## 2020-12-17 DIAGNOSIS — Z79818 Long term (current) use of other agents affecting estrogen receptors and estrogen levels: Secondary | ICD-10-CM | POA: Diagnosis not present

## 2020-12-17 DIAGNOSIS — C50911 Malignant neoplasm of unspecified site of right female breast: Secondary | ICD-10-CM

## 2020-12-17 DIAGNOSIS — Z7982 Long term (current) use of aspirin: Secondary | ICD-10-CM | POA: Insufficient documentation

## 2020-12-17 DIAGNOSIS — Z800 Family history of malignant neoplasm of digestive organs: Secondary | ICD-10-CM | POA: Insufficient documentation

## 2020-12-17 DIAGNOSIS — E785 Hyperlipidemia, unspecified: Secondary | ICD-10-CM | POA: Diagnosis not present

## 2020-12-17 DIAGNOSIS — M8580 Other specified disorders of bone density and structure, unspecified site: Secondary | ICD-10-CM | POA: Insufficient documentation

## 2020-12-17 DIAGNOSIS — I890 Lymphedema, not elsewhere classified: Secondary | ICD-10-CM

## 2020-12-17 DIAGNOSIS — I471 Supraventricular tachycardia: Secondary | ICD-10-CM | POA: Insufficient documentation

## 2020-12-17 DIAGNOSIS — Z79811 Long term (current) use of aromatase inhibitors: Secondary | ICD-10-CM | POA: Diagnosis not present

## 2020-12-17 DIAGNOSIS — Z803 Family history of malignant neoplasm of breast: Secondary | ICD-10-CM | POA: Insufficient documentation

## 2020-12-17 LAB — COMPREHENSIVE METABOLIC PANEL
ALT: 12 U/L (ref 0–44)
AST: 19 U/L (ref 15–41)
Albumin: 3.6 g/dL (ref 3.5–5.0)
Alkaline Phosphatase: 63 U/L (ref 38–126)
Anion gap: 10 (ref 5–15)
BUN: 25 mg/dL — ABNORMAL HIGH (ref 8–23)
CO2: 18 mmol/L — ABNORMAL LOW (ref 22–32)
Calcium: 9.1 mg/dL (ref 8.9–10.3)
Chloride: 109 mmol/L (ref 98–111)
Creatinine, Ser: 1.34 mg/dL — ABNORMAL HIGH (ref 0.44–1.00)
GFR, Estimated: 40 mL/min — ABNORMAL LOW (ref >60)
Glucose, Bld: 110 mg/dL — ABNORMAL HIGH (ref 70–99)
Potassium: 4 mmol/L (ref 3.5–5.1)
Sodium: 137 mmol/L (ref 135–145)
Total Bilirubin: <0.2 mg/dL — ABNORMAL LOW (ref 0.3–1.2)
Total Protein: 7.6 g/dL (ref 6.5–8.1)

## 2020-12-17 LAB — CBC WITH DIFFERENTIAL/PLATELET
Abs Immature Granulocytes: 0.02 10*3/uL (ref 0.00–0.07)
Basophils Absolute: 0 10*3/uL (ref 0.0–0.1)
Basophils Relative: 1 %
Eosinophils Absolute: 0.1 10*3/uL (ref 0.0–0.5)
Eosinophils Relative: 1 %
HCT: 26.3 % — ABNORMAL LOW (ref 36.0–46.0)
Hemoglobin: 9.1 g/dL — ABNORMAL LOW (ref 12.0–15.0)
Immature Granulocytes: 1 %
Lymphocytes Relative: 18 %
Lymphs Abs: 0.8 10*3/uL (ref 0.7–4.0)
MCH: 37.3 pg — ABNORMAL HIGH (ref 26.0–34.0)
MCHC: 34.6 g/dL (ref 30.0–36.0)
MCV: 107.8 fL — ABNORMAL HIGH (ref 80.0–100.0)
Monocytes Absolute: 0.7 10*3/uL (ref 0.1–1.0)
Monocytes Relative: 16 %
Neutro Abs: 2.8 10*3/uL (ref 1.7–7.7)
Neutrophils Relative %: 63 %
Platelets: 179 10*3/uL (ref 150–400)
RBC: 2.44 MIL/uL — ABNORMAL LOW (ref 3.87–5.11)
RDW: 13.5 % (ref 11.5–15.5)
WBC: 4.4 10*3/uL (ref 4.0–10.5)
nRBC: 0 % (ref 0.0–0.2)

## 2020-12-17 LAB — MAGNESIUM: Magnesium: 1.3 mg/dL — ABNORMAL LOW (ref 1.7–2.4)

## 2020-12-17 MED ORDER — FULVESTRANT 250 MG/5ML IM SOSY
500.0000 mg | PREFILLED_SYRINGE | Freq: Once | INTRAMUSCULAR | Status: AC
  Administered 2020-12-17: 500 mg via INTRAMUSCULAR
  Filled 2020-12-17: qty 10

## 2020-12-17 NOTE — Patient Instructions (Signed)
Fulvestrant injection °What is this medication? °FULVESTRANT (ful VES trant) blocks the effects of estrogen. It is used to treat breast cancer. °This medicine may be used for other purposes; ask your health care provider or pharmacist if you have questions. °COMMON BRAND NAME(S): FASLODEX °What should I tell my care team before I take this medication? °They need to know if you have any of these conditions: °bleeding disorders °liver disease °low blood counts, like low white cell, platelet, or red cell counts °an unusual or allergic reaction to fulvestrant, other medicines, foods, dyes, or preservatives °pregnant or trying to get pregnant °breast-feeding °How should I use this medication? °This medicine is for injection into a muscle. It is usually given by a health care professional in a hospital or clinic setting. °Talk to your pediatrician regarding the use of this medicine in children. Special care may be needed. °Overdosage: If you think you have taken too much of this medicine contact a poison control center or emergency room at once. °NOTE: This medicine is only for you. Do not share this medicine with others. °What if I miss a dose? °It is important not to miss your dose. Call your doctor or health care professional if you are unable to keep an appointment. °What may interact with this medication? °medicines that treat or prevent blood clots like warfarin, enoxaparin, dalteparin, apixaban, dabigatran, and rivaroxaban °This list may not describe all possible interactions. Give your health care provider a list of all the medicines, herbs, non-prescription drugs, or dietary supplements you use. Also tell them if you smoke, drink alcohol, or use illegal drugs. Some items may interact with your medicine. °What should I watch for while using this medication? °Your condition will be monitored carefully while you are receiving this medicine. You will need important blood work done while you are taking this  medicine. °Do not become pregnant while taking this medicine or for at least 1 year after stopping it. Women of child-bearing potential will need to have a negative pregnancy test before starting this medicine. Women should inform their doctor if they wish to become pregnant or think they might be pregnant. There is a potential for serious side effects to an unborn child. Men should inform their doctors if they wish to father a child. This medicine may lower sperm counts. Talk to your health care professional or pharmacist for more information. Do not breast-feed an infant while taking this medicine or for 1 year after the last dose. °What side effects may I notice from receiving this medication? °Side effects that you should report to your doctor or health care professional as soon as possible: °allergic reactions like skin rash, itching or hives, swelling of the face, lips, or tongue °feeling faint or lightheaded, falls °pain, tingling, numbness, or weakness in the legs °signs and symptoms of infection like fever or chills; cough; flu-like symptoms; sore throat °vaginal bleeding °Side effects that usually do not require medical attention (report to your doctor or health care professional if they continue or are bothersome): °aches, pains °constipation °diarrhea °headache °hot flashes °nausea, vomiting °pain at site where injected °stomach pain °This list may not describe all possible side effects. Call your doctor for medical advice about side effects. You may report side effects to FDA at 1-800-FDA-1088. °Where should I keep my medication? °This drug is given in a hospital or clinic and will not be stored at home. °NOTE: This sheet is a summary. It may not cover all possible information. If you have   questions about this medicine, talk to your doctor, pharmacist, or health care provider. °© 2022 [employer]vier/Gold Standard (2017-05-04 00:00:00) ° °

## 2020-12-18 LAB — CANCER ANTIGEN 27.29: CA 27.29: 49.9 U/mL — ABNORMAL HIGH (ref 0.0–38.6)

## 2020-12-19 ENCOUNTER — Ambulatory Visit: Payer: Medicare Other

## 2020-12-19 ENCOUNTER — Other Ambulatory Visit: Payer: Medicare Other

## 2020-12-25 ENCOUNTER — Other Ambulatory Visit: Payer: Self-pay | Admitting: Internal Medicine

## 2020-12-25 NOTE — Telephone Encounter (Signed)
Please refill as per office routine med refill policy (all routine meds to be refilled for 3 mo or monthly (per pt preference) up to one year from last visit, then month to month grace period for 3 mo, then further med refills will have to be denied) ? ?

## 2020-12-28 ENCOUNTER — Other Ambulatory Visit: Payer: Self-pay | Admitting: Oncology

## 2020-12-30 ENCOUNTER — Telehealth: Payer: Self-pay | Admitting: Internal Medicine

## 2020-12-30 NOTE — Telephone Encounter (Signed)
ENT referral was placed 12/16/20. Called and spoke with pt letting her know that this had been done and stated to her that we would check with PCCs for update and she verbalized understanding.  Routing to [employer].

## 2021-01-07 ENCOUNTER — Encounter: Payer: Self-pay | Admitting: Internal Medicine

## 2021-01-07 ENCOUNTER — Other Ambulatory Visit: Payer: Self-pay

## 2021-01-07 ENCOUNTER — Ambulatory Visit (INDEPENDENT_AMBULATORY_CARE_PROVIDER_SITE_OTHER): Payer: Medicare Other | Admitting: Internal Medicine

## 2021-01-07 VITALS — BP 120/62 | HR 92 | Temp 98.6°F | Ht 63.75 in | Wt 143.0 lb

## 2021-01-07 DIAGNOSIS — I10 Essential (primary) hypertension: Secondary | ICD-10-CM | POA: Diagnosis not present

## 2021-01-07 DIAGNOSIS — M25551 Pain in right hip: Secondary | ICD-10-CM | POA: Diagnosis not present

## 2021-01-07 DIAGNOSIS — R739 Hyperglycemia, unspecified: Secondary | ICD-10-CM

## 2021-01-07 DIAGNOSIS — E781 Pure hyperglyceridemia: Secondary | ICD-10-CM | POA: Diagnosis not present

## 2021-01-07 NOTE — Telephone Encounter (Signed)
Judeen Hammans - please provide an update on this one.

## 2021-01-07 NOTE — Progress Notes (Signed)
Patient ID: Stacey [name], female   DOB: 1941/01/13, 80 y.o.   MRN: [id number]        Chief Complaint: follow up right hip pain, htn, hld, hyperglycemia       HPI:  Stacey [name] is a 80 y.o. female here with c/o 2 wks onset mild lateral right hip sharp pain and soreness, worse to lie on right side at night, better to not do this, nothing else makes better or worse.  Pt denies chest pain, increased sob or doe, wheezing, orthopnea, PND, increased LE swelling, palpitations, dizziness or syncope.   Pt denies polydipsia, polyuria, or new focal neuro s/s.   Pt denies fever, wt loss, night sweats, loss of appetite, or other constitutional symptoms   Saw ENT in sept 2022  Wt Readings from Last 3 Encounters:  01/07/21 143 lb (64.9 kg)  12/17/20 144 lb (65.3 kg)  11/25/20 146 lb 9.6 oz (66.5 kg)   BP Readings from Last 3 Encounters:  01/07/21 120/62  12/17/20 (!) 146/58  11/25/20 124/78         Past Medical History:  Diagnosis Date   ANEMIA-NOS    ANXIETY    Breast cancer ([place]) 1997 L, 2012 R   s/p chemo/xrt   COPD    resolved   DIVERTICULOSIS, COLON 2008   Dizziness    Family history of breast cancer    Family history of lung cancer    Family history of lymphoma    Family history of pancreatic cancer    Family history of uterine cancer    GERD    Hx of radiation therapy 10/19/11 -12/03/11   right breast   HYPERLIPIDEMIA    IRRITABLE BOWEL SYNDROME, HX OF    Left-sided carotid artery disease (HCC)    moderate left ICA stenosis   OSTEOARTHRITIS, HAND    PSVT (paroxysmal supraventricular tachycardia) ([place])    symptomatic on event monitor   Past Surgical History:  Procedure Laterality Date   ABDOMINAL HYSTERECTOMY     APPENDECTOMY     BREAST BIOPSY  11/14/10    r breast: inv, insitu mammary carcinoma w/calcif, er/pr +, her2 -   BREAST SURGERY     lumpectomy   CATARACT EXTRACTION     both eyes   ELECTROPHYSIOLOGIC STUDY N/A 05/10/2015   Procedure: SVT Ablation;  Surgeon: Will  Meredith Leeds, MD;  Location: [facility];  Service: Cardiovascular;  Laterality: N/A;   HERNIA REPAIR     inguinal herniorrhapy  1984   left   IR US GUIDE BX ASP/DRAIN  05/26/2019   rectal fissure repair     s/p benign breast biopsy  2003   right   s/p left foot surgury  2009   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 Her smoking use included cigarettes. She has a 54.00 pack-year smoking history. She has never used smokeless tobacco. She reports current alcohol use. She reports that she does not use drugs. family history includes Brain cancer in her cousin; Breast cancer in her cousin, cousin, and cousin; Colon polyps in her mother; Diabetes in her mother; Hypertension in her mother; Lung cancer in her maternal grandfather and paternal  uncle; Lung cancer (age of onset: 33) in her maternal grandmother; Lymphoma in her brother; Pancreatic cancer (age of onset: 38) in her mother; Stroke in her mother; Testicular cancer in her cousin; Uterine cancer (age of onset: 40) in her mother. Allergies  Allergen Reactions   Aminoglycosides    Bee Venom Swelling   Clindamycin/Lincomycin Diarrhea and Nausea And Vomiting   Codeine Nausea And Vomiting   Current Outpatient Medications on File Prior to Visit  Medication Sig Dispense Refill   anastrozole (ARIMIDEX) 1 MG tablet TAKE 1 TABLET(1 MG) BY MOUTH DAILY 90 tablet 4   aspirin 81 MG EC tablet Take 81 mg by mouth daily.     calcium-vitamin D (OSCAL WITH D) 500-200 MG-UNIT per tablet Take 1 tablet by mouth daily.     cholecalciferol (VITAMIN D) 1000 UNITS tablet Take 1,000 Units by mouth daily.     cholestyramine (QUESTRAN) 4 g packet MIX AND DRINK 1 PACKET(4 GRAMS) BY MOUTH THREE TIMES DAILY 60 each 12   clotrimazole-betamethasone (LOTRISONE) cream  Apply 1 application topically 2 (two) times daily as needed. 30 g 1   diphenhydrAMINE (BENADRYL) 25 MG tablet Take 25 mg by mouth every 6 (six) hours as needed (for bee stings).      fulvestrant (FASLODEX) 250 MG/5ML injection Inject 250 mg into the muscle once. One injection each buttock over 1-2 minutes. Warm prior to use.     loperamide (IMODIUM) 2 MG capsule Take 2 tablets (4 mg) by mouth for first loose stool, then 1 tablet (2 mg) for every loose stool thereafter. Do not exceed 8 tablets (16 mg) in a 24 hour period. Stop loperamide if 12 hours have passed without a loose stool. 30 capsule    loratadine (CLARITIN) 10 MG tablet Take 10 mg by mouth daily.     Magnesium Oxide 250 MG TABS Take 500 mg by mouth once daily. Administer (preferably with food) at least 2 hours apart from other medications. 30 tablet 0   Multiple Vitamin (MULTIVITAMIN) capsule Take 1 capsule by mouth daily.     omeprazole (PRILOSEC) 20 MG capsule Take 1 capsule by mouth daily 90 capsule 2   palbociclib (IBRANCE) 100 MG tablet Take 1 tablet (100 mg total) by mouth Monday through Friday for 3 weeks, then one week off, then repeat 15 tablet 6   prochlorperazine (COMPAZINE) 10 MG tablet Take 1 tablet (10 mg total) by mouth every 6 (six) hours as needed for nausea or vomiting. 30 tablet 1   pseudoephedrine (SUDAFED) 30 MG tablet Take 30 mg by mouth every 4 (four) hours as needed for congestion.     SPIRIVA RESPIMAT 2.5 MCG/ACT AERS INHALE 2 PUFFS INTO THE LUNGS DAILY 4 g 11   telmisartan (MICARDIS) 40 MG tablet TAKE 1 TABLET(40 MG) BY MOUTH DAILY 90 tablet 0   triamcinolone (NASACORT) 55 MCG/ACT AERO nasal inhaler Place 2 sprays into the nose daily. 2 sprays each nostril at night before bedtime 1 each 12   triamterene-hydrochlorothiazide (DYAZIDE) 37.5-25 MG capsule TAKE 1 CAPSULE BY MOUTH EVERY MORNING 90 capsule 4   zolpidem (AMBIEN) 10 MG tablet TAKE 1 TABLET(10 MG) BY MOUTH AT BEDTIME AS NEEDED 90 tablet 1   diclofenac  sodium (VOLTAREN) 1 % GEL Apply 4 g topically 4 (four) times daily as needed. (Patient not taking: Reported on 09/26/2020) 400 g 11   fluticasone (FLONASE) 50 MCG/ACT nasal spray Place 2 sprays into both nostrils daily. (Patient not taking: Reported on 09/26/2020)  No current facility-administered medications on file prior to visit.        ROS:  All others reviewed and negative.  Objective        PE:  BP 120/62 (BP Location: Right Arm, Patient Position: Sitting, Cuff Size: Large)   Pulse 92   Temp 98.6 F (37 C) (Oral)   Ht 5' 3.75" (1.619 m)   Wt 143 lb (64.9 kg)   SpO2 98%   BMI 24.74 kg/m                 Constitutional: Pt appears in NAD               HENT: Head: NCAT.                Right Ear: External ear normal.                 Left Ear: External ear normal.                Eyes: . Pupils are equal, round, and reactive to light. Conjunctivae and EOM are normal               Nose: without d/c or deformity               Neck: Neck supple. Gross normal ROM               Cardiovascular: Normal rate and regular rhythm.                 Pulmonary/Chest: Effort normal and breath sounds without rales or wheezing.                Abd:  Soft, NT, ND, + BS, no organomegaly               Neurological: Pt is alert. At baseline orientation, motor grossly intact               Skin: Skin is warm. No rashes, no other new lesions, LE edema - none               Mild tender over right greater trochanter               Psychiatric: Pt behavior is normal without agitation   Micro: none  Cardiac tracings I have personally interpreted today:  none  Pertinent Radiological findings (summarize): none   Lab Results  Component Value Date   WBC 4.4 12/17/2020   HGB 9.1 (L) 12/17/2020   HCT 26.3 (L) 12/17/2020   PLT 179 12/17/2020   GLUCOSE 110 (H) 12/17/2020   CHOL 193 07/13/2018   TRIG 231.0 (H) 07/13/2018   HDL 49.40 07/13/2018   LDLDIRECT 119.0 07/13/2018   LDLCALC 84 07/04/2009   ALT 12  12/17/2020   AST 19 12/17/2020   NA 137 12/17/2020   K 4.0 12/17/2020   CL 109 12/17/2020   CREATININE 1.34 (H) 12/17/2020   BUN 25 (H) 12/17/2020   CO2 18 (L) 12/17/2020   TSH 3.41 07/13/2018   INR 1.1 05/26/2019   HGBA1C 6.6 (H) 07/13/2018   Assessment/Plan:  Stacey [name] is a 80 y.o. White or Caucasian [1] female with  has a past medical history of ANEMIA-NOS, ANXIETY, Breast cancer ([place]) (1997 L, 2012 R), COPD, DIVERTICULOSIS, COLON (2008), Dizziness, Family history of breast cancer, Family history of lung cancer, Family history of lymphoma, Family history of pancreatic cancer, Family history of uterine cancer, GERD, radiation therapy (10/19/11 -  12/03/11), HYPERLIPIDEMIA, IRRITABLE BOWEL SYNDROME, HX OF, Left-sided carotid artery disease ([place]), OSTEOARTHRITIS, HAND, and PSVT (paroxysmal supraventricular tachycardia) ([place]).  Hyperlipidemia Lab Results  Component Value Date   LDLCALC 84 07/04/2009   Stable, pt to continue current low chol diet, declines statin   Hyperglycemia Lab Results  Component Value Date   HGBA1C 6.6 (H) 07/13/2018   Stable, pt to continue current medical treatment  - diet   HTN (hypertension) BP Readings from Last 3 Encounters:  01/07/21 120/62  12/17/20 (!) 146/58  11/25/20 124/78   Stable, pt to continue medical treatment micardis, dyazide   Right hip pain Overall very mild, pt just wanted an explanation and is c/w bursitis, for tylenol prn, avoid sleeping on right side, consider sport med if any worse  Followup: Return if symptoms worsen or fail to improve.  Cathlean Cower, MD 01/12/2021 2:59 PM [facility]

## 2021-01-08 NOTE — Telephone Encounter (Signed)
[facility] fax # changed 11/1 and I believe I sent to old fax #.  I called today and verified fax # should be [phone] and I have refaxed referral and asked for pt to be scheduled asap due to order placed on 11/14 and I sent to old #.  I called pt & made her aware of what happened.  Nothing further needed.

## 2021-01-12 ENCOUNTER — Encounter: Payer: Self-pay | Admitting: Internal Medicine

## 2021-01-12 NOTE — Assessment & Plan Note (Signed)
BP Readings from Last 3 Encounters:  01/07/21 120/62  12/17/20 (!) 146/58  11/25/20 124/78   Stable, pt to continue medical treatment micardis, dyazide

## 2021-01-12 NOTE — Patient Instructions (Signed)
Please continue all other medications as before, and refills have been done if requested.  Please have the pharmacy call with any other refills you may need.  Please continue your efforts at being more active, low cholesterol diet, and weight control.  Please keep your appointments with your specialists as you may have planned     

## 2021-01-12 NOTE — Assessment & Plan Note (Addendum)
Overall very mild, pt just wanted an explanation and is c/w bursitis, for tylenol prn, avoid sleeping on right side, consider sport med if any worse

## 2021-01-12 NOTE — Assessment & Plan Note (Signed)
Lab Results  Component Value Date   LDLCALC 84 07/04/2009   Stable, pt to continue current low chol diet, declines statin

## 2021-01-12 NOTE — Assessment & Plan Note (Signed)
Lab Results  Component Value Date   HGBA1C 6.6 (H) 07/13/2018   Stable, pt to continue current medical treatment  - diet

## 2021-01-14 ENCOUNTER — Other Ambulatory Visit: Payer: Medicare Other

## 2021-01-14 ENCOUNTER — Ambulatory Visit: Payer: Medicare Other

## 2021-01-16 ENCOUNTER — Other Ambulatory Visit: Payer: Self-pay

## 2021-01-16 ENCOUNTER — Encounter: Payer: Self-pay | Admitting: Oncology

## 2021-01-16 ENCOUNTER — Inpatient Hospital Stay: Payer: Medicare Other | Attending: Adult Health

## 2021-01-16 ENCOUNTER — Inpatient Hospital Stay: Payer: Medicare Other

## 2021-01-16 VITALS — BP 128/52 | HR 95 | Temp 97.6°F | Resp 22

## 2021-01-16 DIAGNOSIS — C50211 Malignant neoplasm of upper-inner quadrant of right female breast: Secondary | ICD-10-CM | POA: Insufficient documentation

## 2021-01-16 DIAGNOSIS — C779 Secondary and unspecified malignant neoplasm of lymph node, unspecified: Secondary | ICD-10-CM | POA: Diagnosis not present

## 2021-01-16 DIAGNOSIS — C7951 Secondary malignant neoplasm of bone: Secondary | ICD-10-CM

## 2021-01-16 DIAGNOSIS — R9389 Abnormal findings on diagnostic imaging of other specified body structures: Secondary | ICD-10-CM

## 2021-01-16 DIAGNOSIS — Z170 Estrogen receptor positive status [ER+]: Secondary | ICD-10-CM | POA: Insufficient documentation

## 2021-01-16 DIAGNOSIS — C50912 Malignant neoplasm of unspecified site of left female breast: Secondary | ICD-10-CM

## 2021-01-16 DIAGNOSIS — C50911 Malignant neoplasm of unspecified site of right female breast: Secondary | ICD-10-CM

## 2021-01-16 LAB — CBC WITH DIFFERENTIAL/PLATELET
Abs Immature Granulocytes: 0.03 10*3/uL (ref 0.00–0.07)
Basophils Absolute: 0.1 10*3/uL (ref 0.0–0.1)
Basophils Relative: 1 %
Eosinophils Absolute: 0.1 10*3/uL (ref 0.0–0.5)
Eosinophils Relative: 2 %
HCT: 27.7 % — ABNORMAL LOW (ref 36.0–46.0)
Hemoglobin: 9.4 g/dL — ABNORMAL LOW (ref 12.0–15.0)
Immature Granulocytes: 1 %
Lymphocytes Relative: 15 %
Lymphs Abs: 0.7 10*3/uL (ref 0.7–4.0)
MCH: 36.9 pg — ABNORMAL HIGH (ref 26.0–34.0)
MCHC: 33.9 g/dL (ref 30.0–36.0)
MCV: 108.6 fL — ABNORMAL HIGH (ref 80.0–100.0)
Monocytes Absolute: 0.6 10*3/uL (ref 0.1–1.0)
Monocytes Relative: 12 %
Neutro Abs: 3.3 10*3/uL (ref 1.7–7.7)
Neutrophils Relative %: 69 %
Platelets: 278 10*3/uL (ref 150–400)
RBC: 2.55 MIL/uL — ABNORMAL LOW (ref 3.87–5.11)
RDW: 13.4 % (ref 11.5–15.5)
WBC: 4.7 10*3/uL (ref 4.0–10.5)
nRBC: 0 % (ref 0.0–0.2)

## 2021-01-16 LAB — COMPREHENSIVE METABOLIC PANEL
ALT: 12 U/L (ref 0–44)
AST: 18 U/L (ref 15–41)
Albumin: 3.5 g/dL (ref 3.5–5.0)
Alkaline Phosphatase: 72 U/L (ref 38–126)
Anion gap: 11 (ref 5–15)
BUN: 26 mg/dL — ABNORMAL HIGH (ref 8–23)
CO2: 18 mmol/L — ABNORMAL LOW (ref 22–32)
Calcium: 9.1 mg/dL (ref 8.9–10.3)
Chloride: 109 mmol/L (ref 98–111)
Creatinine, Ser: 1.47 mg/dL — ABNORMAL HIGH (ref 0.44–1.00)
GFR, Estimated: 36 mL/min — ABNORMAL LOW (ref >60)
Glucose, Bld: 113 mg/dL — ABNORMAL HIGH (ref 70–99)
Potassium: 4.2 mmol/L (ref 3.5–5.1)
Sodium: 138 mmol/L (ref 135–145)
Total Bilirubin: 0.3 mg/dL (ref 0.3–1.2)
Total Protein: 7.8 g/dL (ref 6.5–8.1)

## 2021-01-16 LAB — MAGNESIUM: Magnesium: 1.4 mg/dL — ABNORMAL LOW (ref 1.7–2.4)

## 2021-01-16 MED ORDER — FULVESTRANT 250 MG/5ML IM SOSY
500.0000 mg | PREFILLED_SYRINGE | Freq: Once | INTRAMUSCULAR | Status: AC
  Administered 2021-01-16: 500 mg via INTRAMUSCULAR
  Filled 2021-01-16: qty 10

## 2021-01-17 ENCOUNTER — Telehealth: Payer: Self-pay

## 2021-01-17 LAB — CANCER ANTIGEN 27.29: CA 27.29: 51.5 U/mL — ABNORMAL HIGH (ref 0.0–38.6)

## 2021-01-17 NOTE — Telephone Encounter (Signed)
Called and left VM for patient regarding lab work results, and requested pt call back for further questions.

## 2021-01-17 NOTE — Telephone Encounter (Signed)
Pt returned call, pt reports mild diarrhea and that she is currently taking 500mg  of magnesium.  I notified LC.

## 2021-02-04 DIAGNOSIS — Z853 Personal history of malignant neoplasm of breast: Secondary | ICD-10-CM | POA: Diagnosis not present

## 2021-02-04 DIAGNOSIS — Z87891 Personal history of nicotine dependence: Secondary | ICD-10-CM | POA: Diagnosis not present

## 2021-02-04 DIAGNOSIS — J3489 Other specified disorders of nose and nasal sinuses: Secondary | ICD-10-CM | POA: Insufficient documentation

## 2021-02-04 DIAGNOSIS — R040 Epistaxis: Secondary | ICD-10-CM | POA: Diagnosis not present

## 2021-02-04 DIAGNOSIS — J320 Chronic maxillary sinusitis: Secondary | ICD-10-CM | POA: Diagnosis not present

## 2021-02-04 DIAGNOSIS — C7951 Secondary malignant neoplasm of bone: Secondary | ICD-10-CM | POA: Diagnosis not present

## 2021-02-11 ENCOUNTER — Other Ambulatory Visit: Payer: Medicare Other

## 2021-02-11 ENCOUNTER — Ambulatory Visit: Payer: Medicare Other

## 2021-02-11 ENCOUNTER — Ambulatory Visit: Payer: Medicare Other | Admitting: Hematology and Oncology

## 2021-02-13 ENCOUNTER — Inpatient Hospital Stay: Payer: Medicare Other

## 2021-02-13 ENCOUNTER — Inpatient Hospital Stay (HOSPITAL_BASED_OUTPATIENT_CLINIC_OR_DEPARTMENT_OTHER): Payer: Medicare Other | Admitting: Hematology and Oncology

## 2021-02-13 ENCOUNTER — Other Ambulatory Visit: Payer: Self-pay

## 2021-02-13 ENCOUNTER — Inpatient Hospital Stay: Payer: Medicare Other | Attending: Adult Health

## 2021-02-13 ENCOUNTER — Encounter: Payer: Self-pay | Admitting: Hematology and Oncology

## 2021-02-13 VITALS — BP 112/55 | HR 82 | Temp 97.5°F | Resp 18 | Ht 63.0 in | Wt 143.5 lb

## 2021-02-13 DIAGNOSIS — Z170 Estrogen receptor positive status [ER+]: Secondary | ICD-10-CM

## 2021-02-13 DIAGNOSIS — C7951 Secondary malignant neoplasm of bone: Secondary | ICD-10-CM

## 2021-02-13 DIAGNOSIS — C50212 Malignant neoplasm of upper-inner quadrant of left female breast: Secondary | ICD-10-CM

## 2021-02-13 DIAGNOSIS — Z87891 Personal history of nicotine dependence: Secondary | ICD-10-CM | POA: Insufficient documentation

## 2021-02-13 DIAGNOSIS — C773 Secondary and unspecified malignant neoplasm of axilla and upper limb lymph nodes: Secondary | ICD-10-CM | POA: Insufficient documentation

## 2021-02-13 DIAGNOSIS — C50912 Malignant neoplasm of unspecified site of left female breast: Secondary | ICD-10-CM

## 2021-02-13 DIAGNOSIS — Z79811 Long term (current) use of aromatase inhibitors: Secondary | ICD-10-CM | POA: Diagnosis not present

## 2021-02-13 DIAGNOSIS — Z923 Personal history of irradiation: Secondary | ICD-10-CM | POA: Insufficient documentation

## 2021-02-13 DIAGNOSIS — Z79899 Other long term (current) drug therapy: Secondary | ICD-10-CM | POA: Insufficient documentation

## 2021-02-13 DIAGNOSIS — M8580 Other specified disorders of bone density and structure, unspecified site: Secondary | ICD-10-CM | POA: Insufficient documentation

## 2021-02-13 DIAGNOSIS — C50911 Malignant neoplasm of unspecified site of right female breast: Secondary | ICD-10-CM

## 2021-02-13 DIAGNOSIS — Z9221 Personal history of antineoplastic chemotherapy: Secondary | ICD-10-CM | POA: Insufficient documentation

## 2021-02-13 DIAGNOSIS — C50211 Malignant neoplasm of upper-inner quadrant of right female breast: Secondary | ICD-10-CM

## 2021-02-13 DIAGNOSIS — Z7982 Long term (current) use of aspirin: Secondary | ICD-10-CM | POA: Insufficient documentation

## 2021-02-13 DIAGNOSIS — Z5111 Encounter for antineoplastic chemotherapy: Secondary | ICD-10-CM | POA: Insufficient documentation

## 2021-02-13 DIAGNOSIS — Z90710 Acquired absence of both cervix and uterus: Secondary | ICD-10-CM | POA: Insufficient documentation

## 2021-02-13 DIAGNOSIS — R9389 Abnormal findings on diagnostic imaging of other specified body structures: Secondary | ICD-10-CM

## 2021-02-13 LAB — COMPREHENSIVE METABOLIC PANEL
ALT: 9 U/L (ref 0–44)
AST: 14 U/L — ABNORMAL LOW (ref 15–41)
Albumin: 3.9 g/dL (ref 3.5–5.0)
Alkaline Phosphatase: 59 U/L (ref 38–126)
Anion gap: 11 (ref 5–15)
BUN: 33 mg/dL — ABNORMAL HIGH (ref 8–23)
CO2: 21 mmol/L — ABNORMAL LOW (ref 22–32)
Calcium: 9.5 mg/dL (ref 8.9–10.3)
Chloride: 105 mmol/L (ref 98–111)
Creatinine, Ser: 1.52 mg/dL — ABNORMAL HIGH (ref 0.44–1.00)
GFR, Estimated: 34 mL/min — ABNORMAL LOW (ref >60)
Glucose, Bld: 100 mg/dL — ABNORMAL HIGH (ref 70–99)
Potassium: 4.2 mmol/L (ref 3.5–5.1)
Sodium: 137 mmol/L (ref 135–145)
Total Bilirubin: 0.3 mg/dL (ref 0.3–1.2)
Total Protein: 7.7 g/dL (ref 6.5–8.1)

## 2021-02-13 LAB — CBC WITH DIFFERENTIAL/PLATELET
Abs Immature Granulocytes: 0.02 10*3/uL (ref 0.00–0.07)
Basophils Absolute: 0 10*3/uL (ref 0.0–0.1)
Basophils Relative: 1 %
Eosinophils Absolute: 0.1 10*3/uL (ref 0.0–0.5)
Eosinophils Relative: 2 %
HCT: 27.6 % — ABNORMAL LOW (ref 36.0–46.0)
Hemoglobin: 9.1 g/dL — ABNORMAL LOW (ref 12.0–15.0)
Immature Granulocytes: 1 %
Lymphocytes Relative: 16 %
Lymphs Abs: 0.5 10*3/uL — ABNORMAL LOW (ref 0.7–4.0)
MCH: 35.7 pg — ABNORMAL HIGH (ref 26.0–34.0)
MCHC: 33 g/dL (ref 30.0–36.0)
MCV: 108.2 fL — ABNORMAL HIGH (ref 80.0–100.0)
Monocytes Absolute: 0.4 10*3/uL (ref 0.1–1.0)
Monocytes Relative: 13 %
Neutro Abs: 2.2 10*3/uL (ref 1.7–7.7)
Neutrophils Relative %: 67 %
Platelets: 212 10*3/uL (ref 150–400)
RBC: 2.55 MIL/uL — ABNORMAL LOW (ref 3.87–5.11)
RDW: 13.2 % (ref 11.5–15.5)
Smear Review: NORMAL
WBC: 3.2 10*3/uL — ABNORMAL LOW (ref 4.0–10.5)
nRBC: 0 % (ref 0.0–0.2)

## 2021-02-13 LAB — MAGNESIUM: Magnesium: 1.2 mg/dL — ABNORMAL LOW (ref 1.7–2.4)

## 2021-02-13 MED ORDER — SODIUM CHLORIDE 0.9 % IV SOLN: Freq: Once | INTRAVENOUS | Status: AC

## 2021-02-13 MED ORDER — MAGNESIUM SULFATE 2 GM/50ML IV SOLN
2.0000 g | Freq: Once | INTRAVENOUS | Status: AC
  Administered 2021-02-13: 2 g via INTRAVENOUS
  Filled 2021-02-13: qty 50

## 2021-02-13 MED ORDER — FULVESTRANT 250 MG/5ML IM SOSY
500.0000 mg | PREFILLED_SYRINGE | Freq: Once | INTRAMUSCULAR | Status: AC
  Administered 2021-02-13: 500 mg via INTRAMUSCULAR
  Filled 2021-02-13: qty 10

## 2021-02-13 NOTE — Patient Instructions (Signed)
Magnesium Sulfate Injection What is this medication? MAGNESIUM SULFATE (mag NEE zee um SUL fate) prevents and treats low levels of magnesium in your body. It may also be used to prevent and treat seizures during pregnancy in people with high blood pressure disorders, such as preeclampsia or eclampsia. Magnesium plays an important role in maintaining the health of your muscles and nervous system. This medicine may be used for other purposes; ask your health care provider or pharmacist if you have questions. What should I tell my care team before I take this medication? They need to know if you have any of these conditions: Heart disease History of irregular heart beat Kidney disease An unusual or allergic reaction to magnesium sulfate, medications, foods, dyes, or preservatives Pregnant or trying to get pregnant Breast-feeding How should I use this medication? This medication is for infusion into a vein. It is given in a hospital or clinic setting. Talk to your care team about the use of this medication in children. While this medication may be prescribed for selected conditions, precautions do apply. Overdosage: If you think you have taken too much of this medicine contact a poison control center or emergency room at once. NOTE: This medicine is only for you. Do not share this medicine with others. What if I miss a dose? This does not apply. What may interact with this medication? Certain medications for anxiety or sleep Certain medications for seizures like phenobarbital Digoxin Medications that relax muscles for surgery Narcotic medications for pain This list may not describe all possible interactions. Give your health care provider a list of all the medicines, herbs, non-prescription drugs, or dietary supplements you use. Also tell them if you smoke, drink alcohol, or use illegal drugs. Some items may interact with your medicine. What should I watch for while using this medication? Your  condition will be monitored carefully while you are receiving this medication. You may need blood work done while you are receiving this medication. What side effects may I notice from receiving this medication? Side effects that you should report to your care team as soon as possible: Allergic reactions--skin rash, itching, hives, swelling of the face, lips, tongue, or throat High magnesium level--confusion, drowsiness, facial flushing, redness, sweating, muscle weakness, fast or irregular heartbeat, trouble breathing Low blood pressure--dizziness, feeling faint or lightheaded, blurry vision Side effects that usually do not require medical attention (report to your care team if they continue or are bothersome): Headache Nausea This list may not describe all possible side effects. Call your doctor for medical advice about side effects. You may report side effects to FDA at 1-800-FDA-1088. Where should I keep my medication? This medication is given in a hospital or clinic and will not be stored at home. NOTE: This sheet is a summary. It may not cover all possible information. If you have questions about this medicine, talk to your doctor, pharmacist, or health care provider.  2022 [employer] (2020-04-04 00:00:00)  

## 2021-02-13 NOTE — Patient Instructions (Signed)
Fulvestrant injection °What is this medication? °FULVESTRANT (ful VES trant) blocks the effects of estrogen. It is used to treat breast cancer. °This medicine may be used for other purposes; ask your health care provider or pharmacist if you have questions. °COMMON BRAND NAME(S): FASLODEX °What should I tell my care team before I take this medication? °They need to know if you have any of these conditions: °bleeding disorders °liver disease °low blood counts, like low white cell, platelet, or red cell counts °an unusual or allergic reaction to fulvestrant, other medicines, foods, dyes, or preservatives °pregnant or trying to get pregnant °breast-feeding °How should I use this medication? °This medicine is for injection into a muscle. It is usually given by a health care professional in a hospital or clinic setting. °Talk to your pediatrician regarding the use of this medicine in children. Special care may be needed. °Overdosage: If you think you have taken too much of this medicine contact a poison control center or emergency room at once. °NOTE: This medicine is only for you. Do not share this medicine with others. °What if I miss a dose? °It is important not to miss your dose. Call your doctor or health care professional if you are unable to keep an appointment. °What may interact with this medication? °medicines that treat or prevent blood clots like warfarin, enoxaparin, dalteparin, apixaban, dabigatran, and rivaroxaban °This list may not describe all possible interactions. Give your health care provider a list of all the medicines, herbs, non-prescription drugs, or dietary supplements you use. Also tell them if you smoke, drink alcohol, or use illegal drugs. Some items may interact with your medicine. °What should I watch for while using this medication? °Your condition will be monitored carefully while you are receiving this medicine. You will need important blood work done while you are taking this  medicine. °Do not become pregnant while taking this medicine or for at least 1 year after stopping it. Women of child-bearing potential will need to have a negative pregnancy test before starting this medicine. Women should inform their doctor if they wish to become pregnant or think they might be pregnant. There is a potential for serious side effects to an unborn child. Men should inform their doctors if they wish to father a child. This medicine may lower sperm counts. Talk to your health care professional or pharmacist for more information. Do not breast-feed an infant while taking this medicine or for 1 year after the last dose. °What side effects may I notice from receiving this medication? °Side effects that you should report to your doctor or health care professional as soon as possible: °allergic reactions like skin rash, itching or hives, swelling of the face, lips, or tongue °feeling faint or lightheaded, falls °pain, tingling, numbness, or weakness in the legs °signs and symptoms of infection like fever or chills; cough; flu-like symptoms; sore throat °vaginal bleeding °Side effects that usually do not require medical attention (report to your doctor or health care professional if they continue or are bothersome): °aches, pains °constipation °diarrhea °headache °hot flashes °nausea, vomiting °pain at site where injected °stomach pain °This list may not describe all possible side effects. Call your doctor for medical advice about side effects. You may report side effects to FDA at 1-800-FDA-1088. °Where should I keep my medication? °This drug is given in a hospital or clinic and will not be stored at home. °NOTE: This sheet is a summary. It may not cover all possible information. If you have   questions about this medicine, talk to your doctor, pharmacist, or health care provider. °© 2022 [employer]vier/Gold Standard (2017-05-04 00:00:00) ° °

## 2021-02-13 NOTE — Progress Notes (Signed)
ID: Birder Stacey [name]   DOB: Nov 29, 1940  MR#: [id number]  UDJ#:[id number]  Patient Care Team: Biagio Borg, MD as PCP - General (Internal Medicine) Tyler Pita, MD as Consulting Physician (Radiation Oncology) Roseanne Kaufman, MD as Consulting Physician (Orthopedic Surgery) Lyndal Pulley, DO as Consulting Physician (Family Medicine) Magrinat, Virgie Dad, MD as Consulting Physician (Oncology) Monna Fam, MD as Consulting Physician (Ophthalmology) Knox Royalty, RN as Case Manager Raina Mina, RPH-CPP (Pharmacist) OTHER MD:  CHIEF COMPLAINT: metastatic breast cancer  CURRENT TREATMENT: anastrozole; palbociclib/Ibrance; fulvestrant   INTERVAL HISTORY:   Stacey [name] returns today for follow up of her metastatic breast cancer.  Bone scan on 11/11/2020 showing: extremely faint uptake within known sclerotic metastases within the left scapula; no evidence of additional osseous metastatic disease.  Of note, she also underwent maxillofacial CT on 12/03/2020 showing: severe mucosal thickening in left maxillary sinus with air-fluid levels and subtle osseous erosions of lateral and medial maxillary sinus walls, consistent with chronic sinusitis, possibly fungal in etiology given osseous erosions.  She continues on anastrozole, which she started 06/02/2019.  She is tolerating this well.  She continues on palbociclib, currently at 115m Monday through Friday (not Saturday or Sunday); three weeks on and one week off. She is having no side effects from this that she is aware of.  She also receives fulvestrant, every 4 weeks.  She tolerates the injections without any difficulty.  XDelton Seeis currently held because of ongoing dental procedures.  Since last visit she has been tolerating all her treatments very well and had no concerns except for mild diarrhea for which she takes intermittent Imodium, takes magnesium supplementation for hypomagnesemia.  Pertinent 10 point ROS reviewed and negative.  We  are following her CA 27-29:  Lab Results  Component Value Date   CA2729 51.5 (H) 01/16/2021   CA2729 49.9 (H) 12/17/2020   CA2729 43.5 (H) 11/21/2020   CA2729 40.2 (H) 10/24/2020   CA2729 49.3 (H) 09/16/2020    REVIEW OF SYSTEMS: GGracilyncontinues to have significant oral problems.  She is working with her dentist and has multiple procedures upcoming.  Otherwise a detailed review of systems today was stable   COVID 19 VACCINATION STATUS: Fully vaccinated with booster in September 2021   HISTORY OF PRESENT ILLNESS:  From the original intake note:  " [name] a 81year old [name], who has a history of breast cancer starting in 1997 and in 2013.    On 11/22/95, she underwent a left breast biopsy, which revealed malignant cells.  She had a left breast lumpectomy with re-excision on 11/29/95, with final pathology revealed IDC, grade 2, 2.5 cm, ER 98%, PR 97%, Ki67 8% with one of 15 nodes being positive.  She received chemotherapy, 4 cycles per patient, along with radiation and took Tamoxifen for 7 years following radiation completion.  Records from 1997 limited.    In October 2012, she had a biopsy of the right breast after mammography recommended additional images for a dense area in the right breast.  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  On 11/25/10, she had an MRI that measured the area in the right breast as 2.2 cm.  She started neoadjuvant [name] in November 2012 and has continued it since.  The area of concern measured 1.7 cm on a repeat MRI in March of 2013.  On  09/03/11, she underwent a right lumpectomy with sentinel lymph node biopsy with final pathology resulting invasive lobular carcinoma with calcifications, grade 2, 2.5 cm wit lobular carcinoma in situ.  Surgical margins were negative with one of one sentinel lymph node positive for  metastatic carcinoma.  She then underwent radiation therapy under the care of Dr. [name] from 10/19/11 to 12/03/11. "   Her subsequent history is as detailed below   PAST MEDICAL HISTORY: Past Medical History:  Diagnosis Date   ANEMIA-NOS    ANXIETY    Breast cancer ([facility]) 1997 L, 2012 R   s/p chemo/xrt   COPD    resolved   DIVERTICULOSIS, COLON 2008   Dizziness    Family history of breast cancer    Family history of lung cancer    Family history of lymphoma    Family history of pancreatic cancer    Family history of uterine cancer    GERD    Hx of radiation therapy 10/19/11 -12/03/11   right breast   HYPERLIPIDEMIA    IRRITABLE BOWEL SYNDROME, HX OF    Left-sided carotid artery disease (HCC)    moderate left ICA stenosis   OSTEOARTHRITIS, HAND    PSVT (paroxysmal supraventricular tachycardia) ([facility])    symptomatic on event monitor    PAST SURGICAL HISTORY: Past Surgical History:  Procedure Laterality Date   ABDOMINAL HYSTERECTOMY     APPENDECTOMY     BREAST BIOPSY  11/14/10    r breast: inv, insitu mammary carcinoma w/calcif, er/pr +, her2 -   BREAST SURGERY     lumpectomy   CATARACT EXTRACTION     both eyes   ELECTROPHYSIOLOGIC STUDY N/A 05/10/2015   Procedure: SVT Ablation;  Surgeon: Will Meredith Leeds, MD;  Location: [facility];  Service: Cardiovascular;  Laterality: N/A;   HERNIA REPAIR     inguinal herniorrhapy  1984   left   IR US GUIDE BX ASP/DRAIN  05/26/2019   rectal fissure repair     s/p benign breast biopsy  2003   right   s/p left foot surgury  2009   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   spiral fx left foot  2008   no surgury   TMJ ARTHROPLASTY  1989   TONGUE SURGERY     1988- to remove scar tissue growth    TONSILLECTOMY      FAMILY HISTORY Family History  Problem Relation Age of Onset   Hypertension Mother    Stroke Mother    Colon polyps Mother    Diabetes Mother    Pancreatic cancer Mother 35   Uterine cancer Mother  44   Lymphoma Brother        burkitts   Lung cancer Paternal Uncle    Lung cancer Maternal Grandmother 67       non-smoker   Lung cancer Maternal Grandfather    Breast cancer Cousin        maternal cousin, dx in her mid 79s   Brain cancer Cousin        maternal cousin's son; dx in his 72s   Testicular cancer Cousin        maternal cousin's son;    Breast cancer Cousin        paternal cousin; dx in her 71s   Breast cancer Cousin        paternal cousin's daughter; dx in 92s; neg genetic testing   Colon cancer Neg Hx  Esophageal cancer Neg Hx    Stomach cancer Neg Hx    Rectal cancer Neg Hx     GYNECOLOGIC HISTORY:   Menarche age 17, GX P0 (one miscarriage at 46 months). Status post vaginal hysterectomy in the late 1970s, no salpingo-oophorectomy    SOCIAL HISTORY:   The patient lives alone and is divorced. She retired from [employer], were she worked in Physiological scientist.   ADVANCED DIRECTIVES:  Living will in place; the patient's healthcare power of attorney is her stepdaughter, Vladimir Faster, who can be reached at [facility]: Social History   Tobacco Use   Smoking status: Former    Packs/day: 1.00    Years: 54.00    Pack years: 54.00    Types: Cigarettes    Quit date: 01/03/2016    Years since quitting: 5.1   Smokeless tobacco: Never  Vaping Use   Vaping Use: Never used  Substance Use Topics   Alcohol use: Yes    Comment: rare/ drinks socially   Drug use: No    Allergies  Allergen Reactions   Aminoglycosides    Bee Venom Swelling   Clindamycin/Lincomycin Diarrhea and Nausea And Vomiting   Codeine Nausea And Vomiting    Current Outpatient Medications  Medication Sig Dispense Refill   anastrozole (ARIMIDEX) 1 MG tablet TAKE 1 TABLET(1 MG) BY MOUTH DAILY 90 tablet 4   aspirin 81 MG EC tablet Take 81 mg by mouth daily.     calcium-vitamin D (OSCAL WITH D) 500-200 MG-UNIT per tablet Take 1 tablet by mouth daily.     cholecalciferol  (VITAMIN D) 1000 UNITS tablet Take 1,000 Units by mouth daily.     cholestyramine (QUESTRAN) 4 g packet MIX AND DRINK 1 PACKET(4 GRAMS) BY MOUTH THREE TIMES DAILY 60 each 12   clotrimazole-betamethasone (LOTRISONE) cream Apply 1 application topically 2 (two) times daily as needed. 30 g 1   diclofenac sodium (VOLTAREN) 1 % GEL Apply 4 g topically 4 (four) times daily as needed. (Patient not taking: Reported on 09/26/2020) 400 g 11   diphenhydrAMINE (BENADRYL) 25 MG tablet Take 25 mg by mouth every 6 (six) hours as needed (for bee stings).      fluticasone (FLONASE) 50 MCG/ACT nasal spray Place 2 sprays into both nostrils daily. (Patient not taking: Reported on 09/26/2020)     fulvestrant (FASLODEX) 250 MG/5ML injection Inject 250 mg into the muscle once. One injection each buttock over 1-2 minutes. Warm prior to use.     loperamide (IMODIUM) 2 MG capsule Take 2 tablets (4 mg) by mouth for first loose stool, then 1 tablet (2 mg) for every loose stool thereafter. Do not exceed 8 tablets (16 mg) in a 24 hour period. Stop loperamide if 12 hours have passed without a loose stool. 30 capsule    loratadine (CLARITIN) 10 MG tablet Take 10 mg by mouth daily.     Magnesium Oxide 250 MG TABS Take 500 mg by mouth once daily. Administer (preferably with food) at least 2 hours apart from other medications. 30 tablet 0   Multiple Vitamin (MULTIVITAMIN) capsule Take 1 capsule by mouth daily.     omeprazole (PRILOSEC) 20 MG capsule Take 1 capsule by mouth daily 90 capsule 2   palbociclib (IBRANCE) 100 MG tablet Take 1 tablet (100 mg total) by mouth Monday through Friday for 3 weeks, then one week off, then repeat 15 tablet 6   prochlorperazine (COMPAZINE) 10 MG tablet Take 1 tablet (10 mg total)  by mouth every 6 (six) hours as needed for nausea or vomiting. 30 tablet 1   pseudoephedrine (SUDAFED) 30 MG tablet Take 30 mg by mouth every 4 (four) hours as needed for congestion.     SPIRIVA RESPIMAT 2.5 MCG/ACT AERS INHALE 2  PUFFS INTO THE LUNGS DAILY 4 g 11   telmisartan (MICARDIS) 40 MG tablet TAKE 1 TABLET(40 MG) BY MOUTH DAILY 90 tablet 0   triamcinolone (NASACORT) 55 MCG/ACT AERO nasal inhaler Place 2 sprays into the nose daily. 2 sprays each nostril at night before bedtime 1 each 12   triamterene-hydrochlorothiazide (DYAZIDE) 37.5-25 MG capsule TAKE 1 CAPSULE BY MOUTH EVERY MORNING 90 capsule 4   zolpidem (AMBIEN) 10 MG tablet TAKE 1 TABLET(10 MG) BY MOUTH AT BEDTIME AS NEEDED 90 tablet 1   No current facility-administered medications for this visit.    OBJECTIVE: White woman who appears younger than stated age 39:   02/13/21 1422  BP: (!) 112/55  Pulse: 82  Resp: 18  Temp: (!) 97.5 F (36.4 C)  SpO2: 100%     Body mass index is 25.42 kg/m.     Sclerae unicteric, EOMs intact Wearing a mask No cervical or supraclavicular adenopathy Lungs no rales or rhonchi Heart regular rate and rhythm Abd soft, nontender, positive bowel sounds MSK no focal spinal tenderness, left compression sleeve in place Neuro: nonfocal, well oriented, appropriate affect Breasts: Deferred  LAB RESULTS: Lab Results  Component Value Date   WBC 3.2 (L) 02/13/2021   NEUTROABS PENDING 02/13/2021   HGB 9.1 (L) 02/13/2021   HCT 27.6 (L) 02/13/2021   MCV 108.2 (H) 02/13/2021   PLT 212 02/13/2021      Chemistry      Component Value Date/Time   NA 137 02/13/2021 1400   NA 140 12/09/2015 1409   K 4.2 02/13/2021 1400   K 4.2 12/09/2015 1409   CL 105 02/13/2021 1400   CL 107 02/02/2012 0939   CO2 21 (L) 02/13/2021 1400   CO2 25 12/09/2015 1409   BUN 33 (H) 02/13/2021 1400   BUN 16.7 12/09/2015 1409   CREATININE 1.52 (H) 02/13/2021 1400   CREATININE 1.19 (H) 11/21/2020 1319   CREATININE 0.8 12/09/2015 1409      Component Value Date/Time   CALCIUM 9.5 02/13/2021 1400   CALCIUM 9.4 12/09/2015 1409   ALKPHOS 59 02/13/2021 1400   ALKPHOS 54 12/09/2015 1409   AST 14 (L) 02/13/2021 1400   AST 19 11/21/2020 1319    AST 20 12/09/2015 1409   ALT 9 02/13/2021 1400   ALT 13 11/21/2020 1319   ALT 13 12/09/2015 1409   BILITOT 0.3 02/13/2021 1400   BILITOT 0.3 11/21/2020 1319   BILITOT <0.22 12/09/2015 1409       Lab Results  Component Value Date   LABCA2 56 (H) 12/10/2010    No components found for: [id number]  No results for input(s): INR in the last 168 hours.  Urinalysis    Component Value Date/Time   COLORURINE YELLOW 07/13/2018 0938   APPEARANCEUR Sl Cloud[place] (A) 07/13/2018 0938   LABSPEC 1.020 07/13/2018 0938   PHURINE 6.0 07/13/2018 0938   GLUCOSEU NEGATIVE 07/13/2018 0938   HGBUR NEGATIVE 07/13/2018 0938   BILIRUBINUR NEGATIVE 07/13/2018 0938   KETONESUR NEGATIVE 07/13/2018 0938   UROBILINOGEN 0.2 07/13/2018 0938   NITRITE NEGATIVE 07/13/2018 0938   LEUKOCYTESUR TRACE (A) 07/13/2018 0938    STUDIES: No results found.    ASSESSMENT: 81 y.o. [place] woman:  LEFT  BREAST #1  S/P LEFT breast lumpectomy with re-excision on 11/29/95 for a T2 N1bi stage IIb invasive ductal carcinoma , grade 2, estrogen receptor 98% positive, progesterone receptor 97% positive, Ki67 8%.  #2 status post 4 cycles of doxorubicin and cyclophosphamide,    (i) followed by radiation therapy under the care of Dr. [name].  #3 received tamoxifen for a total of seven years   RIGHT BREAST #4  S/P biopsy of the RIGHT breast upper inner quadrant on 11/14/10 showing invasive ductal carcinoma,, grade 2, estrogen receptor 85% and progesterone receptor 57% positive, Ki67 20%, HER2 not amplified.    #5 started neoadjuvant letrozole in November 2012; switched to tamoxifen as of February 2016 due to osteopenia concerns  #6  S/P right lumpectomy with sentinel lymph node biopsy on 09/03/11 for a ypT2, ypN1a, stage IIB invasive lobular carcinoma, grade 2,estrogen receptor 97% positive, progesterone receptor 12% positive, with no HER-2 amplification.  #7 status post right breast radiation therapy under the care of  Dr. [name] from 10/19/2011 to 12/03/2011.   #8 did not meet criteria for genetic testing according to her insurance company.  #9 osteopenia, with a T score of -1.8 on DEXA scan at [facility] 03/07/2013  (a) status post multiple dental extractions and implants  (b) repeat bone density at [facility] 04/02/2015 shows a T score of -2.0  METASTATIC DISEASE: April 2021 (lymph nodes, bone)  #10:  left upper extremity lymphedema led to chest CT scan 05/09/2019 showing a 5.1 cm left subpectoral chest wall mass and thoracic lymphadenopathy   (a) CT biopsy of the chest wall mass 05/25/2019 confirms recurrent breast cancer, strongly estrogen and progesterone receptor positive, HER-2 not amplified  (b) PET scan 05/30/2019 shows a left subpectoral mass measuring 5.4 cm, with significant regional and mediastinal adenopathy, sclerotic left scapular metastasis, but no liver or lung involvement.  (c) CA 27-29 is moderately informative  #11 anastrozole started 06/02/2019; palbociclib added 06/08/2019  (a) palbociclib taken irregularly for several months secondary to cytopenias  (b) palbociclib dose decreased to 100 mg daily 21 on 7 off October 2021  (c) CT of the chest with contrast 11/09/2019 shows mild disease progression (despite a continuing drop in the CA 27-29)  (d) fulvestrant added 11/23/2019  (e) repeat chest CT with contrast 09/09/2020 read as stable.  #12 denosumab/Xgeva starting 06/08/2019, to be repeated every 3 months due to hypocalcemia  (a) treatment held after August 2022 dose secondary to ongoing dental issues  #13 genetics testing 06/15/2019 through the [facility] Common Hereditary Cancers Panel found no deleterious mutations in APC, ATM, AXIN2, BARD1, BMPR1A, BRCA1, BRCA2, BRIP1, CDH1, CDKN2A (p14ARF), CDKN2A (p16INK4a), CKD4, CHEK2, CTNNA1, DICER1, EPCAM (Deletion/duplication testing only), GREM1 (promoter region deletion/duplication testing only), KIT, MEN1, MLH1, MSH2, MSH3, MSH6, MUTYH, NBN, NF1,  NHTL1, PALB2, PDGFRA, PMS2, POLD1, POLE, PTEN, RAD50, RAD51C, RAD51D, RNF43, SDHB, SDHC, SDHD, SMAD4, SMARCA4. STK11, TP53, TSC1, TSC2, and VHL.  The following genes were evaluated for sequence changes only: SDHA and HOXB13 c.251G>A variant only.  PLAN:    Merci is now a year and a half out from definitive diagnosis of metastatic breast cancer.   Her disease is very well controlled and she has no symptoms directly attributable to it.  She is also tolerating her double antiestrogen treatment well.  Her palbociclib dose is currently 100 mg 5 days out of 7, 3 weeks out of 4, and we have not had to lower the dose further.  Delton See has been held because of ongoing dental procedures.  As mentioned by Dr. Jana Hakim patient appears to be tolerating current treatment very well.  We will proceed with repeat imaging in March 2023 which is approximately 6-7 months from her previous scan.  If she continues to have excellent control of the disease, we will continue the current regimen and consider repeating imaging twice a year.  And diarrhea from antineoplastic therapy, can continue Imodium as needed, currently grade 1 Hypomagnesemia, unclear etiology could be reduced by the diuretic as well as some diarrhea.  Will replace 2 g of IV magnesium today.  She will continue supplementation with magnesium, we will increase to 750 mg po oral.  *Total Encounter Time as defined by the Centers for Medicare and Medicaid Services includes, in addition to the face-to-face time of a patient visit (documented in the note above) non-face-to-face time: obtaining and reviewing outside history, ordering and reviewing medications, tests or procedures, care coordination (communications with other health care professionals or caregivers) and documentation in the medical record.

## 2021-02-14 LAB — CANCER ANTIGEN 27.29: CA 27.29: 46.1 U/mL — ABNORMAL HIGH (ref 0.0–38.6)

## 2021-02-18 ENCOUNTER — Encounter: Payer: Self-pay | Admitting: Hematology and Oncology

## 2021-02-19 ENCOUNTER — Encounter: Payer: Self-pay | Admitting: Oncology

## 2021-02-19 NOTE — Telephone Encounter (Signed)
This RN called pt per her message - she states she is currently starting her 2nd week of ibrance therapy.  Per review with MD- informed her of need for lab for clearance for dental surgery as well as she should hold Ibrance at this time.  Questions answered and appt made for 02/20/2021

## 2021-02-20 ENCOUNTER — Inpatient Hospital Stay: Payer: Medicare Other

## 2021-02-20 ENCOUNTER — Encounter: Payer: Self-pay | Admitting: Hematology and Oncology

## 2021-02-20 ENCOUNTER — Other Ambulatory Visit: Payer: Self-pay

## 2021-02-20 DIAGNOSIS — Z5111 Encounter for antineoplastic chemotherapy: Secondary | ICD-10-CM | POA: Diagnosis not present

## 2021-02-20 DIAGNOSIS — C50211 Malignant neoplasm of upper-inner quadrant of right female breast: Secondary | ICD-10-CM | POA: Diagnosis not present

## 2021-02-20 DIAGNOSIS — R9389 Abnormal findings on diagnostic imaging of other specified body structures: Secondary | ICD-10-CM

## 2021-02-20 DIAGNOSIS — Z170 Estrogen receptor positive status [ER+]: Secondary | ICD-10-CM | POA: Diagnosis not present

## 2021-02-20 DIAGNOSIS — C773 Secondary and unspecified malignant neoplasm of axilla and upper limb lymph nodes: Secondary | ICD-10-CM | POA: Diagnosis not present

## 2021-02-20 DIAGNOSIS — C50212 Malignant neoplasm of upper-inner quadrant of left female breast: Secondary | ICD-10-CM

## 2021-02-20 DIAGNOSIS — C50911 Malignant neoplasm of unspecified site of right female breast: Secondary | ICD-10-CM

## 2021-02-20 DIAGNOSIS — C7951 Secondary malignant neoplasm of bone: Secondary | ICD-10-CM | POA: Diagnosis not present

## 2021-02-20 LAB — COMPREHENSIVE METABOLIC PANEL
ALT: 9 U/L (ref 0–44)
AST: 16 U/L (ref 15–41)
Albumin: 3.9 g/dL (ref 3.5–5.0)
Alkaline Phosphatase: 53 U/L (ref 38–126)
Anion gap: 9 (ref 5–15)
BUN: 32 mg/dL — ABNORMAL HIGH (ref 8–23)
CO2: 21 mmol/L — ABNORMAL LOW (ref 22–32)
Calcium: 9.4 mg/dL (ref 8.9–10.3)
Chloride: 107 mmol/L (ref 98–111)
Creatinine, Ser: 1.41 mg/dL — ABNORMAL HIGH (ref 0.44–1.00)
GFR, Estimated: 38 mL/min — ABNORMAL LOW (ref >60)
Glucose, Bld: 99 mg/dL (ref 70–99)
Potassium: 4.5 mmol/L (ref 3.5–5.1)
Sodium: 137 mmol/L (ref 135–145)
Total Bilirubin: 0.3 mg/dL (ref 0.3–1.2)
Total Protein: 7.6 g/dL (ref 6.5–8.1)

## 2021-02-20 LAB — CBC WITH DIFFERENTIAL/PLATELET
Abs Immature Granulocytes: 0.01 10*3/uL (ref 0.00–0.07)
Basophils Absolute: 0 10*3/uL (ref 0.0–0.1)
Basophils Relative: 1 %
Eosinophils Absolute: 0 10*3/uL (ref 0.0–0.5)
Eosinophils Relative: 1 %
HCT: 27.1 % — ABNORMAL LOW (ref 36.0–46.0)
Hemoglobin: 9 g/dL — ABNORMAL LOW (ref 12.0–15.0)
Immature Granulocytes: 0 %
Lymphocytes Relative: 17 %
Lymphs Abs: 0.5 10*3/uL — ABNORMAL LOW (ref 0.7–4.0)
MCH: 35.6 pg — ABNORMAL HIGH (ref 26.0–34.0)
MCHC: 33.2 g/dL (ref 30.0–36.0)
MCV: 107.1 fL — ABNORMAL HIGH (ref 80.0–100.0)
Monocytes Absolute: 0.3 10*3/uL (ref 0.1–1.0)
Monocytes Relative: 10 %
Neutro Abs: 2.1 10*3/uL (ref 1.7–7.7)
Neutrophils Relative %: 71 %
Platelets: 215 10*3/uL (ref 150–400)
RBC: 2.53 MIL/uL — ABNORMAL LOW (ref 3.87–5.11)
RDW: 13 % (ref 11.5–15.5)
Smear Review: NORMAL
WBC: 3.1 10*3/uL — ABNORMAL LOW (ref 4.0–10.5)
nRBC: 0 % (ref 0.0–0.2)

## 2021-02-20 LAB — MAGNESIUM: Magnesium: 1.5 mg/dL — ABNORMAL LOW (ref 1.7–2.4)

## 2021-02-24 NOTE — Telephone Encounter (Signed)
Patient is approved  for Ibrance at no cost from [employer] 02/02/21-02/01/22  [place] uses [place] Patient Stacey [name] Phone [phone] Fax [phone] 02/24/2021 11:11 AM

## 2021-02-25 ENCOUNTER — Ambulatory Visit (INDEPENDENT_AMBULATORY_CARE_PROVIDER_SITE_OTHER): Payer: Medicare Other | Admitting: *Deleted

## 2021-02-25 DIAGNOSIS — C50212 Malignant neoplasm of upper-inner quadrant of left female breast: Secondary | ICD-10-CM

## 2021-02-25 DIAGNOSIS — I10 Essential (primary) hypertension: Secondary | ICD-10-CM

## 2021-02-25 NOTE — Patient Instructions (Signed)
Visit Information  Stacey [name], thank you for taking time to talk with me today. Please don't hesitate to contact me if I can be of assistance to you before our next scheduled telephone appointment.  Below are the goals we discussed today:  Patient Self-Care Activities: Patient Stacey [name] will: Take medications as prescribed Attend all scheduled provider appointments Call pharmacy for medication refills Call provider office for new concerns or questions Continue to follow heart healthy, low salt diet Keep up the great work preventing falls Stay as active as possible around weather conditions  Our next scheduled telephone follow up visit/ appointment with care management team member is scheduled on:  Tuesday July 22, 2021 at 3:00 pm  If you need to cancel or re-schedule our visit, please call [phone] and our care guide team will be happy to assist you.   I look forward to hearing about your progress.   [name], RN, BSN, [place] [phone]: direct office  If you are experiencing a Mental Health or [place] or need someone to talk to, please  call the Suicide and Crisis Lifeline: 988 call the [place] National Suicide Prevention Lifeline: (858) 417-8013 or TTY: (352)585-6208 TTY 219 594 3247) to talk to a trained counselor call 1-800-273-TALK (toll free, 24 hour hotline) go to [facility] [address] [place] [phone]) call 911   Patient verbalizes understanding of instructions and care plan provided today and agrees to view in [place]. Active MyChart status confirmed with patient  Health Maintenance, Female Adopting a healthy lifestyle and getting preventive care are important in promoting health and wellness. Ask your health care provider about: The right schedule for you to have regular tests and exams. Things you can do on your own to prevent diseases and  keep yourself healthy. What should I know about diet, weight, and exercise? Eat a healthy diet  Eat a diet that includes plenty of vegetables, fruits, low-fat dairy products, and lean protein. Do not eat a lot of foods that are high in solid fats, added sugars, or sodium. Maintain a healthy weight Body mass index (BMI) is used to identify weight problems. It estimates body fat based on height and weight. Your health care provider can help determine your BMI and help you achieve or maintain a healthy weight. Get regular exercise Get regular exercise. This is one of the most important things you can do for your health. Most adults should: Exercise for at least 150 minutes each week. The exercise should increase your heart rate and make you sweat (moderate-intensity exercise). Do strengthening exercises at least twice a week. This is in addition to the moderate-intensity exercise. Spend less time sitting. Even light physical activity can be beneficial. Watch cholesterol and blood lipids Have your blood tested for lipids and cholesterol at 81 years of age, then have this test every 5 years. Have your cholesterol levels checked more often if: Your lipid or cholesterol levels are high. You are older than 81 years of age. You are at high risk for heart disease. What should I know about cancer screening? Depending on your health history and family history, you may need to have cancer screening at various ages. This may include screening for: Breast cancer. Cervical cancer. Colorectal cancer. Skin cancer. Lung cancer. What should I know about heart disease, diabetes, and high blood pressure? Blood pressure and heart disease High blood pressure causes heart disease and increases the risk of stroke.  This is more likely to develop in people who have high blood pressure readings or are overweight. Have your blood pressure checked: Every 3-5 years if you are 102-20 years of age. Every year if you are  42 years old or older. Diabetes Have regular diabetes screenings. This checks your fasting blood sugar level. Have the screening done: Once every three years after age 29 if you are at a normal weight and have a low risk for diabetes. More often and at a younger age if you are overweight or have a high risk for diabetes. What should I know about preventing infection? Hepatitis B If you have a higher risk for hepatitis B, you should be screened for this virus. Talk with your health care provider to find out if you are at risk for hepatitis B infection. Hepatitis C Testing is recommended for: Everyone born from [birth year] through 1965. Anyone with known risk factors for hepatitis C. Sexually transmitted infections (STIs) Get screened for STIs, including gonorrhea and chlamydia, if: You are sexually active and are younger than 81 years of age. You are older than 81 years of age and your health care provider tells you that you are at risk for this type of infection. Your sexual activity has changed since you were last screened, and you are at increased risk for chlamydia or gonorrhea. Ask your health care provider if you are at risk. Ask your health care provider about whether you are at high risk for HIV. Your health care provider may recommend a prescription medicine to help prevent HIV infection. If you choose to take medicine to prevent HIV, you should first get tested for HIV. You should then be tested every 3 months for as long as you are taking the medicine. Pregnancy If you are about to stop having your period (premenopausal) and you may become pregnant, seek counseling before you get pregnant. Take 400 to 800 micrograms (mcg) of folic acid every day if you become pregnant. Ask for birth control (contraception) if you want to prevent pregnancy. Osteoporosis and menopause Osteoporosis is a disease in which the bones lose minerals and strength with aging. This can result in bone fractures. If you  are 86 years old or older, or if you are at risk for osteoporosis and fractures, ask your health care provider if you should: Be screened for bone loss. Take a calcium or vitamin D supplement to lower your risk of fractures. Be given hormone replacement therapy (HRT) to treat symptoms of menopause. Follow these instructions at home: Alcohol use Do not drink alcohol if: Your health care provider tells you not to drink. You are pregnant, may be pregnant, or are planning to become pregnant. If you drink alcohol: Limit how much you have to: 0-1 drink a day. Know how much alcohol is in your drink. In the [place] one drink equals one 12 oz bottle of beer (355 mL), one 5 oz glass of wine (148 mL), or one 1 oz glass of hard liquor (44 mL). Lifestyle Do not use any products that contain nicotine or tobacco. These products include cigarettes, chewing tobacco, and vaping devices, such as e-cigarettes. If you need help quitting, ask your health care provider. Do not use street drugs. Do not share needles. Ask your health care provider for help if you need support or information about quitting drugs. General instructions Schedule regular health, dental, and eye exams. Stay current with your vaccines. Tell your health care provider if: You often feel depressed. You have  ever been abused or do not feel safe at home. Summary Adopting a healthy lifestyle and getting preventive care are important in promoting health and wellness. Follow your health care provider's instructions about healthy diet, exercising, and getting tested or screened for diseases. Follow your health care provider's instructions on monitoring your cholesterol and blood pressure. This information is not intended to replace advice given to you by your health care provider. Make sure you discuss any questions you have with your health care provider. Document Revised: 06/10/2020 Document Reviewed: 06/10/2020 Elsevier Patient Education   [place].

## 2021-02-25 NOTE — Chronic Care Management (AMB) (Signed)
Chronic Care Management   CCM RN Visit Note  02/25/2021 Name: Stacey [name] MRN: [id number] DOB: 1940/10/09  Subjective: Stacey [name] is a 81 y.o. year old female who is a primary care patient of Biagio Borg, MD. The care management team was consulted for assistance with disease management and care coordination needs.    Engaged with patient by telephone for follow up visit in response to provider referral for case management and/or care coordination services.   Consent to Services:  The patient was given information about Chronic Care Management services, agreed to services, and gave verbal consent prior to initiation of services.  Please see initial visit note for detailed documentation.  Patient agreed to services and verbal consent obtained.   Assessment: Review of patient past medical history, allergies, medications, health status, including review of consultants reports, laboratory and other test data, was performed as part of comprehensive evaluation and provision of chronic care management services.   SDOH (Social Determinants of Health) assessments and interventions performed:  SDOH Interventions    Flowsheet Row Most Recent Value  SDOH Interventions   Food Insecurity Interventions Intervention Not Indicated  [Denies food insecurity]  Transportation Interventions Intervention Not Indicated  [Continues driving self]       CCM Care Plan Allergies  Allergen Reactions   Aminoglycosides    Bee Venom Swelling   Clindamycin/Lincomycin Diarrhea and Nausea And Vomiting   Codeine Nausea And Vomiting   Outpatient Encounter Medications as of 02/25/2021  Medication Sig   anastrozole (ARIMIDEX) 1 MG tablet TAKE 1 TABLET(1 MG) BY MOUTH DAILY   aspirin 81 MG EC tablet Take 81 mg by mouth daily.   calcium-vitamin D (OSCAL WITH D) 500-200 MG-UNIT per tablet Take 1 tablet by mouth daily.   cholecalciferol (VITAMIN D) 1000 UNITS tablet Take 1,000 Units by mouth daily.   cholestyramine  (QUESTRAN) 4 g packet MIX AND DRINK 1 PACKET(4 GRAMS) BY MOUTH THREE TIMES DAILY   clotrimazole-betamethasone (LOTRISONE) cream Apply 1 application topically 2 (two) times daily as needed.   diclofenac sodium (VOLTAREN) 1 % GEL Apply 4 g topically 4 (four) times daily as needed. (Patient not taking: Reported on 09/26/2020)   diphenhydrAMINE (BENADRYL) 25 MG tablet Take 25 mg by mouth every 6 (six) hours as needed (for bee stings).    fluticasone (FLONASE) 50 MCG/ACT nasal spray Place 2 sprays into both nostrils daily. (Patient not taking: Reported on 09/26/2020)   fulvestrant (FASLODEX) 250 MG/5ML injection Inject 250 mg into the muscle once. One injection each buttock over 1-2 minutes. Warm prior to use.   loperamide (IMODIUM) 2 MG capsule Take 2 tablets (4 mg) by mouth for first loose stool, then 1 tablet (2 mg) for every loose stool thereafter. Do not exceed 8 tablets (16 mg) in a 24 hour period. Stop loperamide if 12 hours have passed without a loose stool.   loratadine (CLARITIN) 10 MG tablet Take 10 mg by mouth daily.   Magnesium Oxide 250 MG TABS Take 500 mg by mouth once daily. Administer (preferably with food) at least 2 hours apart from other medications.   Multiple Vitamin (MULTIVITAMIN) capsule Take 1 capsule by mouth daily.   omeprazole (PRILOSEC) 20 MG capsule Take 1 capsule by mouth daily   palbociclib (IBRANCE) 100 MG tablet Take 1 tablet (100 mg total) by mouth Monday through Friday for 3 weeks, then one week off, then repeat   prochlorperazine (COMPAZINE) 10 MG tablet Take 1 tablet (10 mg total) by mouth every  6 (six) hours as needed for nausea or vomiting.   pseudoephedrine (SUDAFED) 30 MG tablet Take 30 mg by mouth every 4 (four) hours as needed for congestion.   SPIRIVA RESPIMAT 2.5 MCG/ACT AERS INHALE 2 PUFFS INTO THE LUNGS DAILY   telmisartan (MICARDIS) 40 MG tablet TAKE 1 TABLET(40 MG) BY MOUTH DAILY   triamcinolone (NASACORT) 55 MCG/ACT AERO nasal inhaler Place 2 sprays into  the nose daily. 2 sprays each nostril at night before bedtime   triamterene-hydrochlorothiazide (DYAZIDE) 37.5-25 MG capsule TAKE 1 CAPSULE BY MOUTH EVERY MORNING   zolpidem (AMBIEN) 10 MG tablet TAKE 1 TABLET(10 MG) BY MOUTH AT BEDTIME AS NEEDED   No facility-administered encounter medications on file as of 02/25/2021.   Patient Active Problem List   Diagnosis Date Noted   Chronic sinusitis 11/25/2020   Allergic rhinitis 07/08/2020   Hypomagnesemia 06/10/2020   Goals of care, counseling/discussion 06/21/2019   Family history of breast cancer    Family history of pancreatic cancer    Family history of uterine cancer    Family history of lymphoma    Family history of lung cancer    Bone metastases ([place]) 06/02/2019   Recurrent cancer of left breast ([place]) 05/15/2019   Pes anserine bursitis 03/31/2019   HTN (hypertension) 02/10/2019   Hyperglycemia 06/21/2018   Left carpal tunnel syndrome 02/22/2018   Rotator cuff arthropathy of left shoulder 09/20/2017   Arthritis of hand 08/23/2017   Pain in right hand 08/23/2017   Cervical radiculitis 04/09/2017   Left shoulder pain 04/09/2017   Dyspnea on exertion 05/14/2016   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   Lymphedema of left arm 01/14/2016   Left arm swelling 12/25/2015   SVT (supraventricular tachycardia) ([place])    Left-sided carotid artery disease ([place]) 03/08/2015   Dizziness 01/24/2015   Urinary frequency 01/13/2015   Dizziness and giddiness 01/09/2015   Gallstones 02/21/2013   Malignant neoplasm of upper-inner quadrant of left breast in female, estrogen receptor positive ([place]) 11/28/2012   Muscle cramping 09/12/2012   Right hip pain 09/12/2012   Lower back pain 09/12/2012   COPD GOLD I     Hx of radiation therapy    Right shoulder pain 09/14/2011   Preventative health care 07/29/2010   Skin lesion of left leg 07/29/2010   Paresthesia 07/29/2010   GERD 03/28/2010   CONSTIPATION 03/28/2010    Osteoarthrosis, 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   Anxiety state 05/03/2009   Hyperlipidemia 06/14/2007   FATIGUE 06/14/2007   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   DIVERTICULOSIS, COLON 12/17/2006   Cough 12/17/2006   IRRITABLE BOWEL SYNDROME, HX OF 12/17/2006   Conditions to be addressed/monitored:  HTN and cancer  Care Plan : RN Care Manager Plan of Care  Updates made by Knox Royalty, RN since 02/25/2021 12:00 AM     Problem: Chronic disease management needs   Priority: Medium     Long-Range Goal: Ongoing adherence to established plan of care for long term disease management   Start Date: 09/05/2020  Expected End Date: 09/05/2021  Priority: Medium  Note:   Current Barriers:  Chronic Disease Management support and education needs related to HTN and Cancer Recent oral surgery 02/21/21 for removal of implants secondary to complications from Xgeva therapy-- decreased ability to eat/ drink  RNCM Clinical Goal(s):  Patient will demonstrate ongoing adherence to prescribed treatment plan for HTN and Cancer as evidenced by adherence to prescribed medication regimen contacting provider for new or worsened symptoms  or questions attending all provider appointments    Interventions: 1:1 collaboration with primary care provider regarding development and update of comprehensive plan of care as evidenced by provider attestation and co-signature Inter-disciplinary care team collaboration (see longitudinal plan of care) Evaluation of current treatment plan related to  self management and patient's adherence to plan as established by provider; 02/25/21- SDOH updated; no new/ unmet needs identified 02/25/21- Pain assessment updated: denies acute/ chronic pain today; reports previously reported hip pain 01/07/22 is "now better" 02/25/21- Falls assessment updated: continues to deny falls x 12 months; does not use assistive devices- positive reinforcement provided with encouragement to continue efforts, previously  provided fall risks/ prevention education reinforced 02/25/21- reviewed recent dental oral surgery 02/21/21-- patient having difficulty eating/ drinking, but reports she is slowly/ gradually improving; taking in as much soft food/ liquid as possible; she denies need for assistance, states she fully expects this to continue improving once the oral surgery work "starts and keeps healing;" confirms she has scheduled follow up with oral surgeon; discussed strategies with patient to maintain hydration/ nutritional intake-- reports she is already doing; denies specific clinical concerns today   Hypertension: (Status: 02/25/21: Goal on Track (progressing): YES.) Long Term goal Last practice recorded BP readings:  BP Readings from Last 3 Encounters:  02/13/21 (!) 112/55  01/16/21 (!) 128/52  01/07/21 120/62  Most recent eGFR/CrCl:  Lab Results  Component Value Date   EGFR 69 (L) 12/09/2015    No components found for: CRCL  Evaluation of current treatment plan related to hypertension self management and patient's adherence to plan as established by provider;   Reviewed prescribed diet heart healthy, low salt, DASH diet Discussed plans with patient for ongoing care management follow up and provided patient with direct contact information for care management team; Discussed complications of poorly controlled blood pressure such as heart disease, stroke, circulatory complications, vision complications, kidney impairment, sexual dysfunction;  Confirmed patient does not routinely monitor/ record blood pressures at home, continues to track through medical office visits: reports "blood pressures have all been good;" we reviewed recent blood pressures obtained at provider office visits Confirmed no recent changes to medications; patient continues taking medications independently and denies concerns around medications  Breast CA  (Status: 02/25/21: Goal on Track (progressing): YES.)  Long Term goal Evaluation of  current treatment plan related to  cancer ,     self-management and patient's adherence to plan as established by provider. Discussed plans with patient for ongoing care management follow up and provided patient with direct contact information for care management team Reviewed scheduled/upcoming provider appointments including oncology provider 03/13/21 and 04/10/21; PCP 07/11/21; Discussed plan of care for ongoing treatment breast CA: again reports "everything is stable for now;" (L) arm lymphedema persists; reports new arm sleeve that oncologist recommended "doesn't help much-- I just have to live with it;" patient reports she is pleased with new oncologist that took Dr. Virgie Dad place; awaiting scheduling for routine surveillance scans in the future  Patient Goals/Self-Care Activities: As evidenced by review of EHR, collaboration with care team, and patient reporting during CCM RN CM outreach,  Patient Irina will: Take medications as prescribed Attend all scheduled provider appointments Call pharmacy for medication refills Call provider office for new concerns or questions Continue to follow heart healthy, low salt diet Keep up the great work preventing falls Stay as active as possible around weather conditions    Plan: Telephone follow up appointment with care management team member scheduled for:  Tuesday, July 22, 2021 at 3:00 pm The patient has been provided with contact information for the care management team and has been advised to call with any health related questions or concerns  [name], RN, BSN, [place] [phone]: direct office

## 2021-03-04 DIAGNOSIS — C50212 Malignant neoplasm of upper-inner quadrant of left female breast: Secondary | ICD-10-CM | POA: Diagnosis not present

## 2021-03-04 DIAGNOSIS — Z170 Estrogen receptor positive status [ER+]: Secondary | ICD-10-CM | POA: Diagnosis not present

## 2021-03-04 DIAGNOSIS — I10 Essential (primary) hypertension: Secondary | ICD-10-CM

## 2021-03-11 ENCOUNTER — Other Ambulatory Visit: Payer: Medicare Other

## 2021-03-11 ENCOUNTER — Ambulatory Visit: Payer: Medicare Other

## 2021-03-12 DIAGNOSIS — J320 Chronic maxillary sinusitis: Secondary | ICD-10-CM | POA: Diagnosis not present

## 2021-03-13 ENCOUNTER — Encounter: Payer: Self-pay | Admitting: Hematology and Oncology

## 2021-03-13 ENCOUNTER — Inpatient Hospital Stay: Payer: Medicare Other

## 2021-03-13 ENCOUNTER — Inpatient Hospital Stay (HOSPITAL_BASED_OUTPATIENT_CLINIC_OR_DEPARTMENT_OTHER): Payer: Medicare Other | Admitting: Hematology and Oncology

## 2021-03-13 ENCOUNTER — Inpatient Hospital Stay: Payer: Medicare Other | Attending: Adult Health

## 2021-03-13 ENCOUNTER — Other Ambulatory Visit: Payer: Self-pay

## 2021-03-13 VITALS — BP 111/65 | HR 86 | Temp 97.7°F | Resp 18 | Ht 63.0 in | Wt 144.5 lb

## 2021-03-13 DIAGNOSIS — C50212 Malignant neoplasm of upper-inner quadrant of left female breast: Secondary | ICD-10-CM | POA: Diagnosis not present

## 2021-03-13 DIAGNOSIS — C7951 Secondary malignant neoplasm of bone: Secondary | ICD-10-CM | POA: Diagnosis not present

## 2021-03-13 DIAGNOSIS — Z79818 Long term (current) use of other agents affecting estrogen receptors and estrogen levels: Secondary | ICD-10-CM | POA: Diagnosis not present

## 2021-03-13 DIAGNOSIS — Z170 Estrogen receptor positive status [ER+]: Secondary | ICD-10-CM | POA: Diagnosis not present

## 2021-03-13 DIAGNOSIS — R197 Diarrhea, unspecified: Secondary | ICD-10-CM | POA: Insufficient documentation

## 2021-03-13 DIAGNOSIS — Z79811 Long term (current) use of aromatase inhibitors: Secondary | ICD-10-CM | POA: Insufficient documentation

## 2021-03-13 DIAGNOSIS — Z87891 Personal history of nicotine dependence: Secondary | ICD-10-CM | POA: Insufficient documentation

## 2021-03-13 DIAGNOSIS — R9389 Abnormal findings on diagnostic imaging of other specified body structures: Secondary | ICD-10-CM

## 2021-03-13 DIAGNOSIS — C50911 Malignant neoplasm of unspecified site of right female breast: Secondary | ICD-10-CM

## 2021-03-13 DIAGNOSIS — C50211 Malignant neoplasm of upper-inner quadrant of right female breast: Secondary | ICD-10-CM | POA: Diagnosis not present

## 2021-03-13 DIAGNOSIS — M8580 Other specified disorders of bone density and structure, unspecified site: Secondary | ICD-10-CM | POA: Insufficient documentation

## 2021-03-13 DIAGNOSIS — C50912 Malignant neoplasm of unspecified site of left female breast: Secondary | ICD-10-CM

## 2021-03-13 LAB — COMPREHENSIVE METABOLIC PANEL
ALT: 12 U/L (ref 0–44)
AST: 21 U/L (ref 15–41)
Albumin: 4 g/dL (ref 3.5–5.0)
Alkaline Phosphatase: 50 U/L (ref 38–126)
Anion gap: 10 (ref 5–15)
BUN: 30 mg/dL — ABNORMAL HIGH (ref 8–23)
CO2: 20 mmol/L — ABNORMAL LOW (ref 22–32)
Calcium: 8.8 mg/dL — ABNORMAL LOW (ref 8.9–10.3)
Chloride: 109 mmol/L (ref 98–111)
Creatinine, Ser: 1.31 mg/dL — ABNORMAL HIGH (ref 0.44–1.00)
GFR, Estimated: 41 mL/min — ABNORMAL LOW (ref >60)
Glucose, Bld: 97 mg/dL (ref 70–99)
Potassium: 4.2 mmol/L (ref 3.5–5.1)
Sodium: 139 mmol/L (ref 135–145)
Total Bilirubin: 0.3 mg/dL (ref 0.3–1.2)
Total Protein: 7.3 g/dL (ref 6.5–8.1)

## 2021-03-13 LAB — CBC WITH DIFFERENTIAL/PLATELET
Abs Immature Granulocytes: 0.02 10*3/uL (ref 0.00–0.07)
Basophils Absolute: 0.1 10*3/uL (ref 0.0–0.1)
Basophils Relative: 2 %
Eosinophils Absolute: 0.1 10*3/uL (ref 0.0–0.5)
Eosinophils Relative: 2 %
HCT: 27.3 % — ABNORMAL LOW (ref 36.0–46.0)
Hemoglobin: 9.3 g/dL — ABNORMAL LOW (ref 12.0–15.0)
Immature Granulocytes: 1 %
Lymphocytes Relative: 21 %
Lymphs Abs: 0.7 10*3/uL (ref 0.7–4.0)
MCH: 35.8 pg — ABNORMAL HIGH (ref 26.0–34.0)
MCHC: 34.1 g/dL (ref 30.0–36.0)
MCV: 105 fL — ABNORMAL HIGH (ref 80.0–100.0)
Monocytes Absolute: 0.6 10*3/uL (ref 0.1–1.0)
Monocytes Relative: 16 %
Neutro Abs: 2 10*3/uL (ref 1.7–7.7)
Neutrophils Relative %: 58 %
Platelets: 164 10*3/uL (ref 150–400)
RBC: 2.6 MIL/uL — ABNORMAL LOW (ref 3.87–5.11)
RDW: 13.8 % (ref 11.5–15.5)
WBC: 3.4 10*3/uL — ABNORMAL LOW (ref 4.0–10.5)
nRBC: 0 % (ref 0.0–0.2)

## 2021-03-13 LAB — MAGNESIUM: Magnesium: 1 mg/dL — ABNORMAL LOW (ref 1.7–2.4)

## 2021-03-13 MED ORDER — SODIUM CHLORIDE 0.9 % IV SOLN: Freq: Once | INTRAVENOUS | Status: AC

## 2021-03-13 MED ORDER — MAGNESIUM SULFATE 4 GM/100ML IV SOLN
4.0000 g | Freq: Once | INTRAVENOUS | Status: AC
  Administered 2021-03-13: 4 g via INTRAVENOUS
  Filled 2021-03-13: qty 100

## 2021-03-13 MED ORDER — FULVESTRANT 250 MG/5ML IM SOSY
500.0000 mg | PREFILLED_SYRINGE | Freq: Once | INTRAMUSCULAR | Status: AC
  Administered 2021-03-13: 500 mg via INTRAMUSCULAR
  Filled 2021-03-13: qty 10

## 2021-03-13 NOTE — Patient Instructions (Signed)
Magnesium Sulfate Injection What is this medication? MAGNESIUM SULFATE (mag NEE zee um SUL fate) prevents and treats low levels of magnesium in your body. It may also be used to prevent and treat seizures during pregnancy in people with high blood pressure disorders, such as preeclampsia or eclampsia. Magnesium plays an important role in maintaining the health of your muscles and nervous system. This medicine may be used for other purposes; ask your health care provider or pharmacist if you have questions. What should I tell my care team before I take this medication? They need to know if you have any of these conditions: Heart disease History of irregular heart beat Kidney disease An unusual or allergic reaction to magnesium sulfate, medications, foods, dyes, or preservatives Pregnant or trying to get pregnant Breast-feeding How should I use this medication? This medication is for infusion into a vein. It is given in a hospital or clinic setting. Talk to your care team about the use of this medication in children. While this medication may be prescribed for selected conditions, precautions do apply. Overdosage: If you think you have taken too much of this medicine contact a poison control center or emergency room at once. NOTE: This medicine is only for you. Do not share this medicine with others. What if I miss a dose? This does not apply. What may interact with this medication? Certain medications for anxiety or sleep Certain medications for seizures like phenobarbital Digoxin Medications that relax muscles for surgery Narcotic medications for pain This list may not describe all possible interactions. Give your health care provider a list of all the medicines, herbs, non-prescription drugs, or dietary supplements you use. Also tell them if you smoke, drink alcohol, or use illegal drugs. Some items may interact with your medicine. What should I watch for while using this medication? Your  condition will be monitored carefully while you are receiving this medication. You may need blood work done while you are receiving this medication. What side effects may I notice from receiving this medication? Side effects that you should report to your care team as soon as possible: Allergic reactions--skin rash, itching, hives, swelling of the face, lips, tongue, or throat High magnesium level--confusion, drowsiness, facial flushing, redness, sweating, muscle weakness, fast or irregular heartbeat, trouble breathing Low blood pressure--dizziness, feeling faint or lightheaded, blurry vision Side effects that usually do not require medical attention (report to your care team if they continue or are bothersome): Headache Nausea This list may not describe all possible side effects. Call your doctor for medical advice about side effects. You may report side effects to FDA at 1-800-FDA-1088. Where should I keep my medication? This medication is given in a hospital or clinic and will not be stored at home. NOTE: This sheet is a summary. It may not cover all possible information. If you have questions about this medicine, talk to your doctor, pharmacist, or health care provider.  2022 [employer] (2020-04-04 00:00:00)  

## 2021-03-13 NOTE — Progress Notes (Signed)
ID: Stacey [name]   DOB: [birth year]  MR#: [id number]  CSN#:[id number]  Patient Care Team: Stacey Borg, MD as PCP - General (Internal Medicine) Stacey Pita, MD as Consulting Physician (Radiation Oncology) Stacey Kaufman, MD as Consulting Physician (Orthopedic Surgery) Stacey Pulley, DO as Consulting Physician (Family Medicine) Stacey [name], Stacey Dad, MD (Inactive) as Consulting Physician (Oncology) Stacey Fam, MD as Consulting Physician (Ophthalmology) Stacey Royalty, RN as Case Manager Stacey [name], RPH-CPP (Pharmacist)  OTHER MD:  CHIEF COMPLAINT: metastatic breast cancer  CURRENT TREATMENT: anastrozole; palbociclib/Ibrance; fulvestrant  INTERVAL HISTORY:    Stacey [name] returns today for follow up of her metastatic breast cancer. She is on Ibrance/arimidex and fulvestrant ordered by Dr Stacey [name]. Her schedule is as mentioned. She continues on anastrozole, which she started 06/02/2019.  She is tolerating this well. She continues on palbociclib, currently at 154m Monday through Friday (not Saturday or Sunday); three weeks on and one week off. She is having no side effects from this that she is aware of. She also receives fulvestrant, every 4 weeks.  She tolerates the injections without any difficulty. She has several dental issues which are her main concern today, she says she has been spending thousands and thousands of dollars but with no great results. She cant eat most of the foods, cant swallow water with out keeping in mouth, overall very frustrated. She apparently also mentioned of some diarrhea to the infusion nurse apparently but didn't mention this to me. Most of her discussion today was about her dental issues. She otherwise has been compliant with all her medications. She hasnt had the scan scheduled yet,  Rest of the pertinent 10 point ROS reviewed and negative.  We are following her CA 27-29:  Lab Results  Component Value Date   CA2729 46.1 (H) 02/13/2021    CA2729 51.5 (H) 01/16/2021   CA2729 49.9 (H) 12/17/2020   CA2729 43.5 (H) 11/21/2020   CA2729 40.2 (H) 10/24/2020    REVIEW OF SYSTEMS: Stacey [name] to have significant oral problems.  She is working with her dentist and has multiple procedures upcoming.  Otherwise a detailed review of systems today was stable   COVID 19 VACCINATION STATUS: Fully vaccinated with booster in September 2021   HISTORY OF PRESENT ILLNESS:  From the original intake note:  " Stacey [name] a 81year old [name], who has a history of breast cancer starting in 1997 and in 2013.    On 11/22/95, she underwent a left breast biopsy, which revealed malignant cells.  She had a left breast lumpectomy with re-excision on 11/29/95, with final pathology revealed IDC, grade 2, 2.5 cm, ER 98%, PR 97%, Ki67 8% with one of 15 nodes being positive.  She received chemotherapy, 4 cycles per patient, along with radiation and took Tamoxifen for 7 years following radiation completion.  Records from 1997 limited.    In October 2012, she had a biopsy of the right breast after mammography recommended additional images for a dense area in the right breast.  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  On 11/25/10, she had an MRI that measured the area in the right breast as 2.2 cm.  She started neoadjuvant [name] in November 2012 and has continued it since.  The area of concern measured 1.7 cm on a repeat MRI in March of 2013.  On 09/03/11, she underwent  a right lumpectomy with sentinel lymph node biopsy with final pathology resulting invasive lobular carcinoma with calcifications, grade 2, 2.5 cm wit lobular carcinoma in situ.  Surgical margins were negative with one of one sentinel lymph node positive for metastatic carcinoma.  She then underwent radiation therapy under the care of Dr. Tammi [name] from 10/19/11 to  12/03/11. "   Her subsequent history is as detailed below   PAST MEDICAL HISTORY: Past Medical History:  Diagnosis Date   ANEMIA-NOS    ANXIETY    Breast cancer ([place]) 1997 L, 2012 R   s/p chemo/xrt   COPD    resolved   DIVERTICULOSIS, COLON 2008   Dizziness    Family history of breast cancer    Family history of lung cancer    Family history of lymphoma    Family history of pancreatic cancer    Family history of uterine cancer    GERD    Hx of radiation therapy 10/19/11 -12/03/11   right breast   HYPERLIPIDEMIA    IRRITABLE BOWEL SYNDROME, HX OF    Left-sided carotid artery disease (HCC)    moderate left ICA stenosis   OSTEOARTHRITIS, HAND    PSVT (paroxysmal supraventricular tachycardia) ([place])    symptomatic on event monitor    PAST SURGICAL HISTORY: Past Surgical History:  Procedure Laterality Date   ABDOMINAL HYSTERECTOMY     APPENDECTOMY     BREAST BIOPSY  11/14/10    r breast: inv, insitu mammary carcinoma w/calcif, er/pr +, her2 -   BREAST SURGERY     lumpectomy   CATARACT EXTRACTION     both eyes   ELECTROPHYSIOLOGIC STUDY N/A 05/10/2015   Procedure: SVT Ablation;  Surgeon: Will Meredith Leeds, MD;  Location: [facility];  Service: Cardiovascular;  Laterality: N/A;   HERNIA REPAIR     inguinal herniorrhapy  1984   left   IR US GUIDE BX ASP/DRAIN  05/26/2019   rectal fissure repair     s/p benign breast biopsy  2003   right   s/p left foot surgury  2009   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   spiral fx left foot  2008   no surgury   TMJ ARTHROPLASTY  1989   TONGUE SURGERY     1988- to remove scar tissue growth    TONSILLECTOMY      FAMILY HISTORY Family History  Problem Relation Age of Onset   Hypertension Mother    Stroke Mother    Colon polyps Mother    Diabetes Mother    Pancreatic cancer Mother 73   Uterine cancer Mother 21   Lymphoma Brother        burkitts   Lung cancer Paternal Uncle    Lung cancer Maternal Grandmother  14       non-smoker   Lung cancer Maternal Grandfather    Breast cancer Cousin        maternal cousin, dx in her mid 61s   Brain cancer Cousin        maternal cousin's son; dx in his 39s   Testicular cancer Cousin        maternal cousin's son;    Breast cancer Cousin        paternal cousin; dx in her 68s   Breast cancer Cousin        paternal cousin's daughter; dx in 27s; neg genetic testing   Colon cancer Neg Hx    Esophageal cancer  Neg Hx    Stomach cancer Neg Hx    Rectal cancer Neg Hx     GYNECOLOGIC HISTORY:   Menarche age 89, GX P0 (one miscarriage at 5 months). Status post vaginal hysterectomy in the late 1970s, no salpingo-oophorectomy    SOCIAL HISTORY:   The patient lives alone and is divorced. She retired from [employer], were she worked in Physiological scientist.   ADVANCED DIRECTIVES:  Living will in place; the patient's healthcare power of attorney is her stepdaughter, Vladimir Faster, who can be reached at [place]: Social History   Tobacco Use   Smoking status: Former    Packs/day: 1.00    Years: 54.00    Pack years: 54.00    Types: Cigarettes    Quit date: 01/03/2016    Years since quitting: 5.1   Smokeless tobacco: Never  Vaping Use   Vaping Use: Never used  Substance Use Topics   Alcohol use: Yes    Comment: rare/ drinks socially   Drug use: No    Allergies  Allergen Reactions   Aminoglycosides    Bee Venom Swelling   Clindamycin/Lincomycin Diarrhea and Nausea And Vomiting   Codeine Nausea And Vomiting    Current Outpatient Medications  Medication Sig Dispense Refill   anastrozole (ARIMIDEX) 1 MG tablet TAKE 1 TABLET(1 MG) BY MOUTH DAILY 90 tablet 4   aspirin 81 MG EC tablet Take 81 mg by mouth daily.     calcium-vitamin D (OSCAL WITH D) 500-200 MG-UNIT per tablet Take 1 tablet by mouth daily.     cholecalciferol (VITAMIN D) 1000 UNITS tablet Take 1,000 Units by mouth daily.     cholestyramine (QUESTRAN) 4 g packet MIX AND  DRINK 1 PACKET(4 GRAMS) BY MOUTH THREE TIMES DAILY 60 each 12   clotrimazole-betamethasone (LOTRISONE) cream Apply 1 application topically 2 (two) times daily as needed. 30 g 1   diclofenac sodium (VOLTAREN) 1 % GEL Apply 4 g topically 4 (four) times daily as needed. (Patient not taking: Reported on 09/26/2020) 400 g 11   diphenhydrAMINE (BENADRYL) 25 MG tablet Take 25 mg by mouth every 6 (six) hours as needed (for bee stings).      fluticasone (FLONASE) 50 MCG/ACT nasal spray Place 2 sprays into both nostrils daily. (Patient not taking: Reported on 09/26/2020)     fulvestrant (FASLODEX) 250 MG/5ML injection Inject 250 mg into the muscle once. One injection each buttock over 1-2 minutes. Warm prior to use.     loperamide (IMODIUM) 2 MG capsule Take 2 tablets (4 mg) by mouth for first loose stool, then 1 tablet (2 mg) for every loose stool thereafter. Do not exceed 8 tablets (16 mg) in a 24 hour period. Stop loperamide if 12 hours have passed without a loose stool. 30 capsule    loratadine (CLARITIN) 10 MG tablet Take 10 mg by mouth daily.     Magnesium Oxide 250 MG TABS Take 500 mg by mouth once daily. Administer (preferably with food) at least 2 hours apart from other medications. 30 tablet 0   Multiple Vitamin (MULTIVITAMIN) capsule Take 1 capsule by mouth daily.     omeprazole (PRILOSEC) 20 MG capsule Take 1 capsule by mouth daily 90 capsule 2   palbociclib (IBRANCE) 100 MG tablet Take 1 tablet (100 mg total) by mouth Monday through Friday for 3 weeks, then one week off, then repeat 15 tablet 6   prochlorperazine (COMPAZINE) 10 MG tablet Take 1 tablet (10 mg total) by mouth  every 6 (six) hours as needed for nausea or vomiting. 30 tablet 1   pseudoephedrine (SUDAFED) 30 MG tablet Take 30 mg by mouth every 4 (four) hours as needed for congestion.     SPIRIVA RESPIMAT 2.5 MCG/ACT AERS INHALE 2 PUFFS INTO THE LUNGS DAILY 4 g 11   telmisartan (MICARDIS) 40 MG tablet TAKE 1 TABLET(40 MG) BY MOUTH DAILY 90  tablet 0   triamcinolone (NASACORT) 55 MCG/ACT AERO nasal inhaler Place 2 sprays into the nose daily. 2 sprays each nostril at night before bedtime 1 each 12   triamterene-hydrochlorothiazide (DYAZIDE) 37.5-25 MG capsule TAKE 1 CAPSULE BY MOUTH EVERY MORNING 90 capsule 4   zolpidem (AMBIEN) 10 MG tablet TAKE 1 TABLET(10 MG) BY MOUTH AT BEDTIME AS NEEDED 90 tablet 1   No current facility-administered medications for this visit.   Facility-Administered Medications Ordered in Other Visits  Medication Dose Route Frequency Provider Last Rate Last Admin   magnesium sulfate IVPB 4 g 100 mL  4 g Intravenous Once Benay Pike, MD 50 mL/hr at 03/13/21 1543 4 g at 03/13/21 1543    OBJECTIVE: White woman who appears younger than stated age 14:   03/13/21 1438  BP: 111/65  Pulse: 86  Resp: 18  Temp: 97.7 F (36.5 C)  SpO2: 100%      Body mass index is 25.6 kg/m.     Sclerae unicteric, EOMs intact Wearing a mask No cervical or supraclavicular adenopathy Lungs no rales or rhonchi Heart regular rate and rhythm Abd soft, nontender, positive bowel sounds MSK no focal spinal tenderness,  Neuro: nonfocal, well oriented, appropriate affect Breasts: deferred  LAB RESULTS: Lab Results  Component Value Date   WBC 3.4 (L) 03/13/2021   NEUTROABS 2.0 03/13/2021   HGB 9.3 (L) 03/13/2021   HCT 27.3 (L) 03/13/2021   MCV 105.0 (H) 03/13/2021   PLT 164 03/13/2021      Chemistry      Component Value Date/Time   NA 139 03/13/2021 1422   NA 140 12/09/2015 1409   K 4.2 03/13/2021 1422   K 4.2 12/09/2015 1409   CL 109 03/13/2021 1422   CL 107 02/02/2012 0939   CO2 20 (L) 03/13/2021 1422   CO2 25 12/09/2015 1409   BUN 30 (H) 03/13/2021 1422   BUN 16.7 12/09/2015 1409   CREATININE 1.31 (H) 03/13/2021 1422   CREATININE 1.19 (H) 11/21/2020 1319   CREATININE 0.8 12/09/2015 1409      Component Value Date/Time   CALCIUM 8.8 (L) 03/13/2021 1422   CALCIUM 9.4 12/09/2015 1409   ALKPHOS 50  03/13/2021 1422   ALKPHOS 54 12/09/2015 1409   AST 21 03/13/2021 1422   AST 19 11/21/2020 1319   AST 20 12/09/2015 1409   ALT 12 03/13/2021 1422   ALT 13 11/21/2020 1319   ALT 13 12/09/2015 1409   BILITOT 0.3 03/13/2021 1422   BILITOT 0.3 11/21/2020 1319   BILITOT <0.22 12/09/2015 1409       Lab Results  Component Value Date   LABCA2 56 (H) 12/10/2010    No components found for: [id number]  No results for input(s): INR in the last 168 hours.  Urinalysis    Component Value Date/Time   COLORURINE YELLOW 07/13/2018 0938   APPEARANCEUR [place] (A) 07/13/2018 0938   LABSPEC 1.020 07/13/2018 0938   PHURINE 6.0 07/13/2018 [place] 07/13/2018 0938   HGBUR NEGATIVE 07/13/2018 0938   BILIRUBINUR NEGATIVE 07/13/2018 0938   KETONESUR NEGATIVE  07/13/2018 0938   UROBILINOGEN 0.2 07/13/2018 0938   NITRITE NEGATIVE 07/13/2018 0938   LEUKOCYTESUR TRACE (A) 07/13/2018 0938    STUDIES: No results found.    ASSESSMENT: 81 y.o. [place] woman:  LEFT BREAST #1  S/P LEFT breast lumpectomy with re-excision on 11/29/95 for a T2 N1bi stage IIb invasive ductal carcinoma , grade 2, estrogen receptor 98% positive, progesterone receptor 97% positive, Ki67 8%.  #2 status post 4 cycles of doxorubicin and cyclophosphamide,    (i) followed by radiation therapy under the care of Dr. [name].  #3 received tamoxifen for a total of seven years   RIGHT BREAST #4  S/P biopsy of the RIGHT breast upper inner quadrant on 11/14/10 showing invasive ductal carcinoma,, grade 2, estrogen receptor 85% and progesterone receptor 57% positive, Ki67 20%, HER2 not amplified.    #5 started neoadjuvant letrozole in November 2012; switched to tamoxifen as of February 2016 due to osteopenia concerns  #6  S/P right lumpectomy with sentinel lymph node biopsy on 09/03/11 for a ypT2, ypN1a, stage IIB invasive lobular carcinoma, grade 2,estrogen receptor 97% positive, progesterone receptor 12%  positive, with no HER-2 amplification.  #7 status post right breast radiation therapy under the care of Dr. Tammi [name] from 10/19/2011 to 12/03/2011.   #8 did not meet criteria for genetic testing according to her insurance company.  #9 osteopenia, with a T score of -1.8 on DEXA scan at [facility] 03/07/2013  (a) status post multiple dental extractions and implants  (b) repeat [facility] density [facility] 04/02/2015 shows a T score of -2.0  METASTATIC DISEASE: April 2021 (lymph nodes, bone)  #10:  left upper extremity lymphedema led to chest CT scan 05/09/2019 showing a 5.1 cm left subpectoral chest wall mass and thoracic lymphadenopathy   (a) CT biopsy of the chest wall mass 05/25/2019 confirms recurrent breast cancer, strongly estrogen and progesterone receptor positive, HER-2 not amplified  (b) PET scan 05/30/2019 shows a left subpectoral mass measuring 5.4 cm, with significant regional and mediastinal adenopathy, sclerotic left scapular metastasis, but no liver or lung involvement.  (c) CA 27-29 is moderately informative  #11 anastrozole started 06/02/2019; palbociclib added 06/08/2019  (a) palbociclib taken irregularly for several months secondary to cytopenias  (b) palbociclib dose decreased to 100 mg daily 21 on 7 off October 2021  (c) CT of the chest with contrast 11/09/2019 shows mild disease progression (despite a continuing drop in the CA 27-29)  (d) fulvestrant added 11/23/2019  (e) repeat chest CT with contrast 09/09/2020 read as stable.  #12 denosumab/Xgeva starting 06/08/2019, to be repeated every 3 months due to hypocalcemia  (a) treatment held after August 2022 dose secondary to ongoing dental issues  #13 genetics testing 06/15/2019 through the [facility] Common Hereditary Cancers Panel found no deleterious mutations in APC, ATM, AXIN2, BARD1, BMPR1A, BRCA1, BRCA2, BRIP1, CDH1, CDKN2A (p14ARF), CDKN2A (p16INK4a), CKD4, CHEK2, CTNNA1, DICER1, EPCAM (Deletion/duplication testing only), GREM1  (promoter region deletion/duplication testing only), KIT, MEN1, MLH1, MSH2, MSH3, MSH6, MUTYH, NBN, NF1, NHTL1, PALB2, PDGFRA, PMS2, POLD1, POLE, PTEN, RAD50, RAD51C, RAD51D, RNF43, SDHB, SDHC, SDHD, SMAD4, SMARCA4. STK11, TP53, TSC1, TSC2, and VHL.  The following genes were evaluated for sequence changes only: SDHA and HOXB13 c.251G>A variant only.  PLAN:    Stacey [name] is now a year and a half out from definitive diagnosis of metastatic breast cancer.   Her disease is very well controlled and she has no symptoms directly attributable to it. She is also tolerating her double antiestrogen treatment well.  Her palbociclib dose is currently 100 mg 5 days out of 7, 3 weeks out of 4, and we have not had to lower the dose further. Delton See has been held because of ongoing dental procedures.   Repeat imaging due in March If she has excellent control of disease, she will continue current regimen and consider imaging every 6 months. She will continue imodium as needed for diarrhea. Hypomagnesemia, unclear etiology could be reduced by the diuretic as well as some diarrhea.  We will give her 4 gms a day. She will continue supplementation with magnesium, 750 mg po oral  Total time spent: 30 minutes.  *Total Encounter Time as defined by the Centers for Medicare and Medicaid Services includes, in addition to the face-to-face time of a patient visit (documented in the note above) non-face-to-face time: obtaining and reviewing outside history, ordering and reviewing medications, tests or procedures, care coordination (communications with other health care professionals or caregivers) and documentation in the medical record.

## 2021-03-13 NOTE — Patient Instructions (Signed)
Fulvestrant injection °What is this medication? °FULVESTRANT (ful VES trant) blocks the effects of estrogen. It is used to treat breast cancer. °This medicine may be used for other purposes; ask your health care provider or pharmacist if you have questions. °COMMON BRAND NAME(S): FASLODEX °What should I tell my care team before I take this medication? °They need to know if you have any of these conditions: °bleeding disorders °liver disease °low blood counts, like low white cell, platelet, or red cell counts °an unusual or allergic reaction to fulvestrant, other medicines, foods, dyes, or preservatives °pregnant or trying to get pregnant °breast-feeding °How should I use this medication? °This medicine is for injection into a muscle. It is usually given by a health care professional in a hospital or clinic setting. °Talk to your pediatrician regarding the use of this medicine in children. Special care may be needed. °Overdosage: If you think you have taken too much of this medicine contact a poison control center or emergency room at once. °NOTE: This medicine is only for you. Do not share this medicine with others. °What if I miss a dose? °It is important not to miss your dose. Call your doctor or health care professional if you are unable to keep an appointment. °What may interact with this medication? °medicines that treat or prevent blood clots like warfarin, enoxaparin, dalteparin, apixaban, dabigatran, and rivaroxaban °This list may not describe all possible interactions. Give your health care provider a list of all the medicines, herbs, non-prescription drugs, or dietary supplements you use. Also tell them if you smoke, drink alcohol, or use illegal drugs. Some items may interact with your medicine. °What should I watch for while using this medication? °Your condition will be monitored carefully while you are receiving this medicine. You will need important blood work done while you are taking this  medicine. °Do not become pregnant while taking this medicine or for at least 1 year after stopping it. Women of child-bearing potential will need to have a negative pregnancy test before starting this medicine. Women should inform their doctor if they wish to become pregnant or think they might be pregnant. There is a potential for serious side effects to an unborn child. Men should inform their doctors if they wish to father a child. This medicine may lower sperm counts. Talk to your health care professional or pharmacist for more information. Do not breast-feed an infant while taking this medicine or for 1 year after the last dose. °What side effects may I notice from receiving this medication? °Side effects that you should report to your doctor or health care professional as soon as possible: °allergic reactions like skin rash, itching or hives, swelling of the face, lips, or tongue °feeling faint or lightheaded, falls °pain, tingling, numbness, or weakness in the legs °signs and symptoms of infection like fever or chills; cough; flu-like symptoms; sore throat °vaginal bleeding °Side effects that usually do not require medical attention (report to your doctor or health care professional if they continue or are bothersome): °aches, pains °constipation °diarrhea °headache °hot flashes °nausea, vomiting °pain at site where injected °stomach pain °This list may not describe all possible side effects. Call your doctor for medical advice about side effects. You may report side effects to FDA at 1-800-FDA-1088. °Where should I keep my medication? °This drug is given in a hospital or clinic and will not be stored at home. °NOTE: This sheet is a summary. It may not cover all possible information. If you have   questions about this medicine, talk to your doctor, pharmacist, or health care provider. °© 2022 [employer]vier/Gold Standard (2017-05-04 00:00:00) ° °

## 2021-03-14 LAB — CANCER ANTIGEN 27.29: CA 27.29: 45.3 U/mL — ABNORMAL HIGH (ref 0.0–38.6)

## 2021-03-25 ENCOUNTER — Other Ambulatory Visit: Payer: Self-pay | Admitting: Internal Medicine

## 2021-03-25 NOTE — Telephone Encounter (Signed)
Please refill as per office routine med refill policy (all routine meds to be refilled for 3 mo or monthly (per pt preference) up to one year from last visit, then month to month grace period for 3 mo, then further med refills will have to be denied) ? ?

## 2021-03-30 ENCOUNTER — Encounter: Payer: Self-pay | Admitting: Adult Health

## 2021-03-31 ENCOUNTER — Encounter: Payer: Self-pay | Admitting: Internal Medicine

## 2021-03-31 MED ORDER — CLOTRIMAZOLE-BETAMETHASONE 1-0.05 % EX CREA: 1.0000 "application " | TOPICAL_CREAM | Freq: Two times a day (BID) | CUTANEOUS | 1 refills | Status: DC | PRN

## 2021-04-01 ENCOUNTER — Other Ambulatory Visit: Payer: Self-pay | Admitting: Internal Medicine

## 2021-04-08 ENCOUNTER — Other Ambulatory Visit: Payer: Medicare Other

## 2021-04-08 ENCOUNTER — Ambulatory Visit: Payer: Medicare Other

## 2021-04-10 ENCOUNTER — Other Ambulatory Visit: Payer: Self-pay

## 2021-04-10 ENCOUNTER — Inpatient Hospital Stay: Payer: Medicare Other

## 2021-04-10 ENCOUNTER — Other Ambulatory Visit: Payer: Medicare Other

## 2021-04-10 ENCOUNTER — Inpatient Hospital Stay: Payer: Medicare Other | Attending: Adult Health

## 2021-04-10 ENCOUNTER — Ambulatory Visit: Payer: Medicare Other

## 2021-04-10 VITALS — BP 138/56 | HR 84 | Temp 98.3°F | Resp 18

## 2021-04-10 DIAGNOSIS — C50911 Malignant neoplasm of unspecified site of right female breast: Secondary | ICD-10-CM

## 2021-04-10 DIAGNOSIS — Z79818 Long term (current) use of other agents affecting estrogen receptors and estrogen levels: Secondary | ICD-10-CM | POA: Insufficient documentation

## 2021-04-10 DIAGNOSIS — C50212 Malignant neoplasm of upper-inner quadrant of left female breast: Secondary | ICD-10-CM

## 2021-04-10 DIAGNOSIS — R9389 Abnormal findings on diagnostic imaging of other specified body structures: Secondary | ICD-10-CM

## 2021-04-10 DIAGNOSIS — C7951 Secondary malignant neoplasm of bone: Secondary | ICD-10-CM

## 2021-04-10 DIAGNOSIS — Z170 Estrogen receptor positive status [ER+]: Secondary | ICD-10-CM | POA: Diagnosis not present

## 2021-04-10 DIAGNOSIS — C50912 Malignant neoplasm of unspecified site of left female breast: Secondary | ICD-10-CM

## 2021-04-10 DIAGNOSIS — C50211 Malignant neoplasm of upper-inner quadrant of right female breast: Secondary | ICD-10-CM | POA: Diagnosis not present

## 2021-04-10 LAB — COMPREHENSIVE METABOLIC PANEL
ALT: 10 U/L (ref 0–44)
AST: 17 U/L (ref 15–41)
Albumin: 4.1 g/dL (ref 3.5–5.0)
Alkaline Phosphatase: 56 U/L (ref 38–126)
Anion gap: 9 (ref 5–15)
BUN: 25 mg/dL — ABNORMAL HIGH (ref 8–23)
CO2: 21 mmol/L — ABNORMAL LOW (ref 22–32)
Calcium: 9.5 mg/dL (ref 8.9–10.3)
Chloride: 107 mmol/L (ref 98–111)
Creatinine, Ser: 1.19 mg/dL — ABNORMAL HIGH (ref 0.44–1.00)
GFR, Estimated: 46 mL/min — ABNORMAL LOW (ref >60)
Glucose, Bld: 99 mg/dL (ref 70–99)
Potassium: 4.1 mmol/L (ref 3.5–5.1)
Sodium: 137 mmol/L (ref 135–145)
Total Bilirubin: 0.3 mg/dL (ref 0.3–1.2)
Total Protein: 7.5 g/dL (ref 6.5–8.1)

## 2021-04-10 LAB — CBC WITH DIFFERENTIAL/PLATELET
Abs Immature Granulocytes: 0.01 10*3/uL (ref 0.00–0.07)
Basophils Absolute: 0 10*3/uL (ref 0.0–0.1)
Basophils Relative: 1 %
Eosinophils Absolute: 0 10*3/uL (ref 0.0–0.5)
Eosinophils Relative: 1 %
HCT: 27.4 % — ABNORMAL LOW (ref 36.0–46.0)
Hemoglobin: 9.4 g/dL — ABNORMAL LOW (ref 12.0–15.0)
Immature Granulocytes: 0 %
Lymphocytes Relative: 19 %
Lymphs Abs: 0.7 10*3/uL (ref 0.7–4.0)
MCH: 35.9 pg — ABNORMAL HIGH (ref 26.0–34.0)
MCHC: 34.3 g/dL (ref 30.0–36.0)
MCV: 104.6 fL — ABNORMAL HIGH (ref 80.0–100.0)
Monocytes Absolute: 0.4 10*3/uL (ref 0.1–1.0)
Monocytes Relative: 12 %
Neutro Abs: 2.4 10*3/uL (ref 1.7–7.7)
Neutrophils Relative %: 67 %
Platelets: 185 10*3/uL (ref 150–400)
RBC: 2.62 MIL/uL — ABNORMAL LOW (ref 3.87–5.11)
RDW: 13.7 % (ref 11.5–15.5)
WBC: 3.6 10*3/uL — ABNORMAL LOW (ref 4.0–10.5)
nRBC: 0 % (ref 0.0–0.2)

## 2021-04-10 LAB — MAGNESIUM: Magnesium: 1.4 mg/dL — ABNORMAL LOW (ref 1.7–2.4)

## 2021-04-10 MED ORDER — FULVESTRANT 250 MG/5ML IM SOSY
500.0000 mg | PREFILLED_SYRINGE | Freq: Once | INTRAMUSCULAR | Status: AC
  Administered 2021-04-10: 15:00:00 500 mg via INTRAMUSCULAR
  Filled 2021-04-10: qty 10

## 2021-04-11 LAB — CANCER ANTIGEN 27.29: CA 27.29: 51.7 U/mL — ABNORMAL HIGH (ref 0.0–38.6)

## 2021-04-15 ENCOUNTER — Encounter (HOSPITAL_COMMUNITY)
Admission: RE | Admit: 2021-04-15 | Discharge: 2021-04-15 | Disposition: A | Discharge status: Home / Self Care | Payer: Medicare Other | Source: Ambulatory Visit | Attending: Hematology and Oncology | Admitting: Hematology and Oncology

## 2021-04-15 ENCOUNTER — Other Ambulatory Visit: Payer: Self-pay

## 2021-04-15 DIAGNOSIS — D3501 Benign neoplasm of right adrenal gland: Secondary | ICD-10-CM | POA: Diagnosis not present

## 2021-04-15 DIAGNOSIS — C50212 Malignant neoplasm of upper-inner quadrant of left female breast: Secondary | ICD-10-CM | POA: Diagnosis not present

## 2021-04-15 DIAGNOSIS — Z170 Estrogen receptor positive status [ER+]: Secondary | ICD-10-CM | POA: Insufficient documentation

## 2021-04-15 DIAGNOSIS — J439 Emphysema, unspecified: Secondary | ICD-10-CM | POA: Diagnosis not present

## 2021-04-15 DIAGNOSIS — K8020 Calculus of gallbladder without cholecystitis without obstruction: Secondary | ICD-10-CM | POA: Diagnosis not present

## 2021-04-15 DIAGNOSIS — K449 Diaphragmatic hernia without obstruction or gangrene: Secondary | ICD-10-CM | POA: Diagnosis not present

## 2021-04-15 DIAGNOSIS — I700 Atherosclerosis of aorta: Secondary | ICD-10-CM | POA: Insufficient documentation

## 2021-04-15 DIAGNOSIS — I2510 Atherosclerotic heart disease of native coronary artery without angina pectoris: Secondary | ICD-10-CM | POA: Diagnosis not present

## 2021-04-15 DIAGNOSIS — C50919 Malignant neoplasm of unspecified site of unspecified female breast: Secondary | ICD-10-CM | POA: Diagnosis not present

## 2021-04-15 DIAGNOSIS — C7951 Secondary malignant neoplasm of bone: Secondary | ICD-10-CM | POA: Diagnosis not present

## 2021-04-15 LAB — GLUCOSE, CAPILLARY: Glucose-Capillary: 102 mg/dL — ABNORMAL HIGH (ref 70–99)

## 2021-04-15 MED ORDER — FLUDEOXYGLUCOSE F - 18 (FDG) INJECTION
7.2000 | Freq: Once | INTRAVENOUS | Status: AC
  Administered 2021-04-15: 7.9 via INTRAVENOUS

## 2021-04-24 ENCOUNTER — Ambulatory Visit: Payer: Medicare Other | Admitting: Hematology and Oncology

## 2021-04-25 ENCOUNTER — Inpatient Hospital Stay (HOSPITAL_BASED_OUTPATIENT_CLINIC_OR_DEPARTMENT_OTHER): Payer: Medicare Other | Admitting: Hematology and Oncology

## 2021-04-25 ENCOUNTER — Encounter: Payer: Self-pay | Admitting: Hematology and Oncology

## 2021-04-25 DIAGNOSIS — Z170 Estrogen receptor positive status [ER+]: Secondary | ICD-10-CM | POA: Diagnosis not present

## 2021-04-25 DIAGNOSIS — C7951 Secondary malignant neoplasm of bone: Secondary | ICD-10-CM

## 2021-04-25 DIAGNOSIS — C50212 Malignant neoplasm of upper-inner quadrant of left female breast: Secondary | ICD-10-CM

## 2021-04-25 NOTE — Progress Notes (Signed)
ID: Stacey [name]   DOB: 09-23-1940  MR#: [id number]  SWN#:[id number] ? ?Patient Care Team: ?Biagio Borg, MD as PCP - General (Internal Medicine) ?Tyler Pita, MD as Consulting Physician (Radiation Oncology) ?Roseanne Kaufman, MD as Consulting Physician (Orthopedic Surgery) ?Lyndal Pulley, DO as Consulting Physician (Family Medicine) ?Magrinat, Virgie Dad, MD (Inactive) as Consulting Physician (Oncology) ?Monna Fam, MD as Consulting Physician (Ophthalmology) ?Knox Royalty, RN as Case Manager ?Raina Mina, RPH-CPP (Pharmacist) ? ?OTHER MD: ? ?CHIEF COMPLAINT: metastatic breast cancer ? ?CURRENT TREATMENT: anastrozole; palbociclib/Ibrance; fulvestrant ? ?INTERVAL HISTORY:   ? ?Lakayla returns today for follow up of her metastatic breast cancer via telephone visit ?She still complains of left breast pain, has been going on for very long. She also is upset about the dental issues. ?She is on Ibrance/arimidex and fulvestrant ordered by Dr Jana Hakim. Her schedule is as mentioned. ?She continues on anastrozole, which she started 06/02/2019.  She is tolerating this well. ?She continues on palbociclib, currently at 170m Monday through Friday (not Saturday or Sunday); three weeks on and one week off. She is having no side effects from this that she is aware of. ?She also receives fulvestrant, every 4 weeks.  She tolerates the injections without any difficulty. ? ?Rest of the pertinent 10 point ROS reviewed and negative. ? ?We are following her CA 27-29:  ?Lab Results  ?Component Value Date  ? CA2729 51.7 (H) 04/10/2021  ? CA2729 45.3 (H) 03/13/2021  ? CA2729 46.1 (H) 02/13/2021  ? CA2729 51.5 (H) 01/16/2021  ? CA2729 49.9 (H) 12/17/2020  ? ? ?REVIEW OF SYSTEMS: ?GCathrinecontinues to have significant oral problems.  She is working with her dentist and has multiple procedures upcoming.  Otherwise a detailed review of systems today was stable ? ? COVID 19 VACCINATION STATUS: Fully vaccinated with booster in September  2021 ? ? ?HISTORY OF PRESENT ILLNESS:  ?From the original intake note: ? ?" GRilynne Lonswayis a 81year old [name], who has a history of breast cancer starting in 1997 and in 2013.   ? ?On 11/22/95, she underwent a left breast biopsy, which revealed malignant cells.  She had a left breast lumpectomy with re-excision on 11/29/95, with final pathology revealed IDC, grade 2, 2.5 cm, ER 98%, PR 97%, Ki67 8% with one of 15 nodes being positive.  She received chemotherapy, 4 cycles per patient, along with radiation and took Tamoxifen for 7 years following radiation completion.  Records from 1997 limited. ? ?  In October 2012, she had a biopsy of the right breast after mammography recommended additional images for a dense area in the right breast.  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  On 11/25/10, she had an MRI that measured the area in the right breast as 2.2 cm.  She started neoadjuvant [name] in November 2012 and has continued it since.  The area of concern measured 1.7 cm on a repeat MRI in March of 2013.  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  Surgical margins were negative with one of one sentinel lymph node positive for metastatic carcinoma.  She then underwent radiation therapy under the care of Dr. MTammi Klippelfrom 10/19/11 to 12/03/11. "  ? ?Her subsequent history  is as detailed below ? ? ?PAST MEDICAL HISTORY: ?Past Medical History:  ?Diagnosis Date  ? ANEMIA-NOS   ? ANXIETY   ? Breast cancer ([place]) 1997 L, 2012 R  ? s/p chemo/xrt  ? COPD   ? resolved  ? DIVERTICULOSIS, COLON 2008  ? Dizziness   ? Family history of breast cancer   ? Family history of lung cancer   ? Family history of lymphoma   ? Family history of  pancreatic cancer   ? Family history of uterine cancer   ? GERD   ? Hx of radiation therapy 10/19/11 -12/03/11  ? right breast  ? HYPERLIPIDEMIA   ? IRRITABLE BOWEL SYNDROME, HX OF   ? Left-sided carotid artery disease ([place])   ? moderate left ICA stenosis  ? OSTEOARTHRITIS, HAND   ? PSVT (paroxysmal supraventricular tachycardia) ([place])   ? symptomatic on event monitor  ? ? ?PAST SURGICAL HISTORY: ?Past Surgical History:  ?Procedure Laterality Date  ? ABDOMINAL HYSTERECTOMY    ? APPENDECTOMY    ? BREAST BIOPSY  11/14/10   ? r breast: inv, insitu mammary carcinoma w/calcif, er/pr +, her2 -  ? BREAST SURGERY    ? lumpectomy  ? CATARACT EXTRACTION    ? both eyes  ? ELECTROPHYSIOLOGIC STUDY N/A 05/10/2015  ? Procedure: SVT Ablation;  Surgeon: Will Meredith Leeds, MD;  Location: [facility];  Service: Cardiovascular;  Laterality: N/A;  ? HERNIA REPAIR    ? inguinal herniorrhapy  1984  ? left  ? IR US GUIDE BX ASP/DRAIN  05/26/2019  ? rectal fissure repair    ? s/p benign breast biopsy  2003  ? right  ? s/p left foot surgury  2009  ? s/p lumpectomy  1997  ? melignant left x 2  ? spiral fx left foot  2008  ? no surgury  ? TMJ ARTHROPLASTY  1989  ? TONGUE SURGERY    ? 1988- to remove scar tissue growth   ? TONSILLECTOMY    ? ? ?FAMILY HISTORY ?Family History  ?Problem Relation Age of Onset  ? Hypertension Mother   ? Stroke Mother   ? Colon polyps Mother   ? Diabetes Mother   ? Pancreatic cancer Mother 7  ? Uterine cancer Mother 64  ? Lymphoma Brother   ?     burkitts  ? Lung cancer Paternal Uncle   ? Lung cancer Maternal Grandmother 63  ?     non-smoker  ? Lung cancer Maternal Grandfather   ? Breast cancer Cousin   ?     maternal cousin, dx in her mid 51s  ? Brain cancer Cousin   ?     maternal cousin's son; dx in his 58s  ? Testicular cancer Cousin   ?     maternal cousin's son;   ? Breast cancer Cousin   ?     paternal cousin; dx in her 61s  ? Breast cancer Cousin   ?     paternal cousin's daughter; dx in 51s; neg  genetic testing  ? Colon cancer Neg Hx   ? Esophageal cancer Neg Hx   ? Stomach cancer Neg Hx   ? Rectal cancer Neg Hx   ? ? ?GYNECOLOGIC HISTORY:   ?Menarche age 25, GX P0 (one miscarriage at 5 months). Status post vaginal hysterectomy in the late 1970s, no salpingo-oophorectomy ?  ? ?SOCIAL HISTORY:   ?The patient lives alone and is divorced. She retired from [employer], were she  worked in Physiological scientist. ? ? ADVANCED DIRECTIVES:  Living will in place; the patient's healthcare power of attorney is her stepdaughter, Vladimir Faster, who can be reached at [phone] ? ? ?HEALTH MAINTENANCE: ?Social History  ? ?Tobacco Use  ? Smoking status: Former  ?  Packs/day: 1.00  ?  Years: 54.00  ?  Pack years: 54.00  ?  Types: Cigarettes  ?  Quit date: 01/03/2016  ?  Years since quitting: 5.3  ? Smokeless tobacco: Never  ?Vaping Use  ? Vaping Use: Never used  ?Substance Use Topics  ? Alcohol use: Yes  ?  Comment: rare/ drinks socially  ? Drug use: No  ? ? ?Allergies  ?Allergen Reactions  ? Aminoglycosides   ? Bee Venom Swelling  ? Clindamycin/Lincomycin Diarrhea and Nausea And Vomiting  ? Codeine Nausea And Vomiting  ? ? ?Current Outpatient Medications  ?Medication Sig Dispense Refill  ? anastrozole (ARIMIDEX) 1 MG tablet TAKE 1 TABLET(1 MG) BY MOUTH DAILY 90 tablet 4  ? aspirin 81 MG EC tablet Take 81 mg by mouth daily.    ? calcium-vitamin D (OSCAL WITH D) 500-200 MG-UNIT per tablet Take 1 tablet by mouth daily.    ? cholecalciferol (VITAMIN D) 1000 UNITS tablet Take 1,000 Units by mouth daily.    ? cholestyramine (QUESTRAN) 4 g packet MIX AND DRINK 1 PACKET(4 GRAMS) BY MOUTH THREE TIMES DAILY 60 each 12  ? clotrimazole-betamethasone (LOTRISONE) cream Apply 1 application topically 2 (two) times daily as needed. 30 g 1  ? diclofenac sodium (VOLTAREN) 1 % GEL Apply 4 g topically 4 (four) times daily as needed. (Patient not taking: Reported on 09/26/2020) 400 g 11  ? diphenhydrAMINE (BENADRYL) 25 MG tablet Take 25 mg by mouth every  6 (six) hours as needed (for bee stings).     ? fluticasone (FLONASE) 50 MCG/ACT nasal spray Place 2 sprays into both nostrils daily. (Patient not taking: Reported on 09/26/2020)    ? fulvestrant (FASLODEX)

## 2021-05-04 ENCOUNTER — Encounter: Payer: Self-pay | Admitting: Oncology

## 2021-05-07 ENCOUNTER — Other Ambulatory Visit: Payer: Self-pay

## 2021-05-07 DIAGNOSIS — C50212 Malignant neoplasm of upper-inner quadrant of left female breast: Secondary | ICD-10-CM

## 2021-05-08 ENCOUNTER — Encounter: Payer: Self-pay | Admitting: Adult Health

## 2021-05-08 ENCOUNTER — Inpatient Hospital Stay (HOSPITAL_BASED_OUTPATIENT_CLINIC_OR_DEPARTMENT_OTHER): Payer: Medicare Other | Admitting: Adult Health

## 2021-05-08 ENCOUNTER — Inpatient Hospital Stay: Payer: Medicare Other

## 2021-05-08 ENCOUNTER — Other Ambulatory Visit: Payer: Self-pay

## 2021-05-08 ENCOUNTER — Inpatient Hospital Stay: Payer: Medicare Other | Attending: Adult Health

## 2021-05-08 VITALS — BP 131/83 | HR 92 | Temp 97.4°F | Resp 18 | Ht 63.0 in | Wt 145.7 lb

## 2021-05-08 DIAGNOSIS — R197 Diarrhea, unspecified: Secondary | ICD-10-CM | POA: Insufficient documentation

## 2021-05-08 DIAGNOSIS — Z170 Estrogen receptor positive status [ER+]: Secondary | ICD-10-CM | POA: Insufficient documentation

## 2021-05-08 DIAGNOSIS — M8580 Other specified disorders of bone density and structure, unspecified site: Secondary | ICD-10-CM | POA: Diagnosis not present

## 2021-05-08 DIAGNOSIS — Z800 Family history of malignant neoplasm of digestive organs: Secondary | ICD-10-CM | POA: Diagnosis not present

## 2021-05-08 DIAGNOSIS — I10 Essential (primary) hypertension: Secondary | ICD-10-CM | POA: Insufficient documentation

## 2021-05-08 DIAGNOSIS — K580 Irritable bowel syndrome with diarrhea: Secondary | ICD-10-CM | POA: Insufficient documentation

## 2021-05-08 DIAGNOSIS — I471 Supraventricular tachycardia: Secondary | ICD-10-CM | POA: Diagnosis not present

## 2021-05-08 DIAGNOSIS — Z79811 Long term (current) use of aromatase inhibitors: Secondary | ICD-10-CM | POA: Insufficient documentation

## 2021-05-08 DIAGNOSIS — C7951 Secondary malignant neoplasm of bone: Secondary | ICD-10-CM

## 2021-05-08 DIAGNOSIS — C50212 Malignant neoplasm of upper-inner quadrant of left female breast: Secondary | ICD-10-CM

## 2021-05-08 DIAGNOSIS — Z803 Family history of malignant neoplasm of breast: Secondary | ICD-10-CM | POA: Diagnosis not present

## 2021-05-08 DIAGNOSIS — Z923 Personal history of irradiation: Secondary | ICD-10-CM | POA: Insufficient documentation

## 2021-05-08 DIAGNOSIS — Z801 Family history of malignant neoplasm of trachea, bronchus and lung: Secondary | ICD-10-CM | POA: Diagnosis not present

## 2021-05-08 DIAGNOSIS — Z79818 Long term (current) use of other agents affecting estrogen receptors and estrogen levels: Secondary | ICD-10-CM | POA: Insufficient documentation

## 2021-05-08 DIAGNOSIS — C50211 Malignant neoplasm of upper-inner quadrant of right female breast: Secondary | ICD-10-CM | POA: Insufficient documentation

## 2021-05-08 DIAGNOSIS — I779 Disorder of arteries and arterioles, unspecified: Secondary | ICD-10-CM | POA: Insufficient documentation

## 2021-05-08 LAB — CBC WITH DIFFERENTIAL (CANCER CENTER ONLY)
Abs Immature Granulocytes: 0.01 10*3/uL (ref 0.00–0.07)
Basophils Absolute: 0.1 10*3/uL (ref 0.0–0.1)
Basophils Relative: 2 %
Eosinophils Absolute: 0 10*3/uL (ref 0.0–0.5)
Eosinophils Relative: 1 %
HCT: 26.9 % — ABNORMAL LOW (ref 36.0–46.0)
Hemoglobin: 9.1 g/dL — ABNORMAL LOW (ref 12.0–15.0)
Immature Granulocytes: 0 %
Lymphocytes Relative: 19 %
Lymphs Abs: 0.6 10*3/uL — ABNORMAL LOW (ref 0.7–4.0)
MCH: 35.8 pg — ABNORMAL HIGH (ref 26.0–34.0)
MCHC: 33.8 g/dL (ref 30.0–36.0)
MCV: 105.9 fL — ABNORMAL HIGH (ref 80.0–100.0)
Monocytes Absolute: 0.6 10*3/uL (ref 0.1–1.0)
Monocytes Relative: 17 %
Neutro Abs: 2.1 10*3/uL (ref 1.7–7.7)
Neutrophils Relative %: 61 %
Platelet Count: 167 10*3/uL (ref 150–400)
RBC: 2.54 MIL/uL — ABNORMAL LOW (ref 3.87–5.11)
RDW: 14 % (ref 11.5–15.5)
Smear Review: NORMAL
WBC Count: 3.4 10*3/uL — ABNORMAL LOW (ref 4.0–10.5)
nRBC: 0 % (ref 0.0–0.2)

## 2021-05-08 LAB — CMP (CANCER CENTER ONLY)
ALT: 11 U/L (ref 0–44)
AST: 19 U/L (ref 15–41)
Albumin: 4 g/dL (ref 3.5–5.0)
Alkaline Phosphatase: 52 U/L (ref 38–126)
Anion gap: 9 (ref 5–15)
BUN: 31 mg/dL — ABNORMAL HIGH (ref 8–23)
CO2: 22 mmol/L (ref 22–32)
Calcium: 9.1 mg/dL (ref 8.9–10.3)
Chloride: 106 mmol/L (ref 98–111)
Creatinine: 1.36 mg/dL — ABNORMAL HIGH (ref 0.44–1.00)
GFR, Estimated: 39 mL/min — ABNORMAL LOW (ref >60)
Glucose, Bld: 102 mg/dL — ABNORMAL HIGH (ref 70–99)
Potassium: 3.9 mmol/L (ref 3.5–5.1)
Sodium: 137 mmol/L (ref 135–145)
Total Bilirubin: 0.2 mg/dL — ABNORMAL LOW (ref 0.3–1.2)
Total Protein: 7.5 g/dL (ref 6.5–8.1)

## 2021-05-08 LAB — MAGNESIUM: Magnesium: 1.5 mg/dL — ABNORMAL LOW (ref 1.7–2.4)

## 2021-05-08 MED ORDER — FULVESTRANT 250 MG/5ML IM SOSY
500.0000 mg | PREFILLED_SYRINGE | Freq: Once | INTRAMUSCULAR | Status: AC
  Administered 2021-05-08: 500 mg via INTRAMUSCULAR
  Filled 2021-05-08: qty 10

## 2021-05-08 NOTE — Assessment & Plan Note (Signed)
Riti is an 81 year old woman with extensive breast cancer history most recently metastatic disease noted to the lymph nodes and bone in April 2021.  She continues on treatment with anastrozole, palbociclib, and fulvestrant. ? ?#1 metastatic breast cancer to the bone: Her most recent PET scan shows no evidence of disease progression.  She will continue on her current treatment as she tolerates this well.  We discussed what might happen if she has further progression.  I let her know that we may opt to rebiopsy and run a Caris or other panel to identify the extent of her treatment options. ? ?#2 diarrhea this is a side effect from her Ibrance.  She has this managed well with taking the cholestyramine if it becomes problematic. ? ?Armella will continue on her current treatment plan and return every 4 weeks for labs and an injection and she will see Dr. Chryl Heck again in 12 weeks. ?

## 2021-05-08 NOTE — Progress Notes (Signed)
Stacey [name]: ?  ? ?Stacey Borg, MD ?[place] [address] ? ? ?DIAGNOSIS: Metastatic Breast Cancer ? ?SUMMARY OF ONCOLOGIC HISTORY: ?LEFT BREAST ?#1  S/P LEFT breast lumpectomy with re-excision on 11/29/95 for a T2 N1bi stage IIb invasive ductal carcinoma , grade 2, estrogen receptor 98% positive, progesterone receptor 97% positive, Ki67 8%. ?  ?#2 status post 4 cycles of doxorubicin and cyclophosphamide,  ?                        (i) followed by radiation therapy under the care of Dr. Sarajane [name]. ?  ?#3 received tamoxifen for a total of seven years  ?  ?RIGHT BREAST ?#4  S/P biopsy of the RIGHT breast upper inner quadrant on 11/14/10 showing invasive ductal carcinoma,, grade 2, estrogen receptor 85% and progesterone receptor 57% positive, Ki67 20%, HER2 not amplified.   ?  ?#5 started neoadjuvant letrozole in November 2012; switched to tamoxifen as of February 2016 due to osteopenia concerns ?  ?#6  S/P right lumpectomy with sentinel lymph node biopsy on 09/03/11 for a ypT2, ypN1a, stage IIB invasive lobular carcinoma, grade 2,estrogen receptor 97% positive, progesterone receptor 12% positive, with no HER-2 amplification. ?  ?#7 status post right breast radiation therapy under the care of Dr. Tammi [name] from 10/19/2011 to 12/03/2011.  ?  ?#8 did not meet criteria for genetic testing according to her insurance company. ?  ?#9 osteopenia, with a T score of -1.8 on DEXA scan at [facility] 03/07/2013 ?            (a) status post multiple dental extractions and implants ?            (b) repeat bone density at [facility] 04/02/2015 shows a T score of -2.0 ?  ?METASTATIC DISEASE: April 2021 (lymph nodes, bone) ?  ?#10:  left upper extremity lymphedema led to chest CT scan 05/09/2019 showing a 5.1 cm left subpectoral chest wall mass and thoracic lymphadenopathy  ?            (a) CT biopsy of the chest wall mass 05/25/2019 confirms recurrent breast cancer, strongly estrogen and  progesterone receptor positive, HER-2 not amplified ?            (b) PET scan 05/30/2019 shows a left subpectoral mass measuring 5.4 cm, with significant regional and mediastinal adenopathy, sclerotic left scapular metastasis, but no liver or lung involvement. ?            (c) CA 27-29 is moderately informative ?  ?#11 anastrozole started 06/02/2019; palbociclib added 06/08/2019 ?            (a) palbociclib taken irregularly for several months secondary to cytopenias ?            (b) palbociclib dose decreased to 100 mg daily 21 on 7 off October 2021 ?            (c) CT of the chest with contrast 11/09/2019 shows mild disease progression (despite a continuing drop in the CA 27-29) ?            (d) fulvestrant added 11/23/2019 ?            (e) repeat chest CT with contrast 09/09/2020 read as stable. ?  ?#12 denosumab/Xgeva starting 06/08/2019, to be repeated every 3 months due to hypocalcemia ?            (a) treatment  held after August 2022 dose secondary to ongoing dental issues ?  ?#13 genetics testing 06/15/2019 through the [facility] Common Hereditary Cancers Panel found no deleterious mutations in APC, ATM, AXIN2, BARD1, BMPR1A, BRCA1, BRCA2, BRIP1, CDH1, CDKN2A (p14ARF), CDKN2A (p16INK4a), CKD4, CHEK2, CTNNA1, DICER1, EPCAM (Deletion/duplication testing only), GREM1 (promoter region deletion/duplication testing only), KIT, MEN1, MLH1, MSH2, MSH3, MSH6, MUTYH, NBN, NF1, NHTL1, PALB2, PDGFRA, PMS2, POLD1, POLE, PTEN, RAD50, RAD51C, RAD51D, RNF43, SDHB, SDHC, SDHD, SMAD4, SMARCA4. STK11, TP53, TSC1, TSC2, and VHL.  The following genes were evaluated for sequence changes only: SDHA and HOXB13 c.251G>A variant only. ? ?CURRENT THERAPY: Anastrozole, Palbociclib, Fulvestrant ? ?INTERVAL HISTORY: ?Stacey [name] 81 y.o. female returns for follow-[name] follow-[name] of her metastatic breast cancer.  Her most recent restaging was completed on April 15, 2021 with a PET scan and showed no evidence of metastatic disease, quiescent  left scapular metastasis and no metabolism in the left brachial plexus region. ? ?She continues on anastrozole daily.  She tolerates this well with no side effects. ? ?She is also taking palbociclib dosed at 100 mg 3 weeks on and 1 week off.  She is currently in her second week.  She says the only side effect she has from this is the fact that she has some diarrhea that occurs in the morning then she is good for the rest of the day.  She tells me because of this she only makes appointments in the afternoon. ? ?Additionally she receives fulvestrant every 4 weeks.  She tolerates this well with no issues. ? ? ?Patient Active Problem List  ? Diagnosis Date Noted  ? Chronic sinusitis 11/25/2020  ? Allergic rhinitis 07/08/2020  ? Hypomagnesemia 06/10/2020  ? Goals of care, counseling/discussion 06/21/2019  ? Family history of breast cancer   ? Family history of pancreatic cancer   ? Family history of uterine cancer   ? Family history of lymphoma   ? Family history of lung cancer   ? 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[place]) 06/02/2019  ? Recurrent cancer of left breast ([place]) 05/15/2019  ? Pes anserine bursitis 03/31/2019  ? HTN (hypertension) 02/10/2019  ? Hyperglycemia 06/21/2018  ? Left carpal tunnel syndrome 02/22/2018  ? Rotator cuff arthropathy of left shoulder 09/20/2017  ? Arthritis of hand 08/23/2017  ? Pain in right hand 08/23/2017  ? Cervical radiculitis 04/09/2017  ? Left shoulder pain 04/09/2017  ? Dyspnea on exertion 05/14/2016  ? 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  ? Lymphedema of left arm 01/14/2016  ? Left arm swelling 12/25/2015  ? SVT (supraventricular tachycardia) ([place])   ? Left-sided carotid artery disease ([place]) 03/08/2015  ? Dizziness 01/24/2015  ? Urinary frequency 01/13/2015  ? Dizziness and giddiness 01/09/2015  ? Gallstones 02/21/2013  ? Malignant neoplasm of upper-inner quadrant of left breast in female, estrogen receptor positive ([place]) 11/28/2012  ? Muscle cramping  09/12/2012  ? Right hip pain 09/12/2012  ? Lower back pain 09/12/2012  ? COPD GOLD I    ? Hx of radiation therapy   ? Right shoulder pain 09/14/2011  ? Preventative health care 07/29/2010  ? Skin lesion of left leg 07/29/2010  ? Paresthesia 07/29/2010  ? GERD 03/28/2010  ? CONSTIPATION 03/28/2010  ? Osteoarthrosis, 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  ? Anxiety state 05/03/2009  ? Hyperlipidemia 06/14/2007  ? FATIGUE 06/14/2007  ? 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 DIVERTICULOSIS, COLON 12/17/2006  ? Cough 12/17/2006  ? IRRITABLE BOWEL SYNDROME, HX OF 12/17/2006  ? ? ?  is allergic to aminoglycosides, bee venom, clindamycin/lincomycin, and codeine. ? ?MEDICAL HISTORY: ?Past Medical History:  ?Diagnosis Date  ? ANEMIA-NOS   ? ANXIETY   ? Breast cancer ([place]) 1997 L, 2012 R  ? s/p chemo/xrt  ? COPD   ? resolved  ? DIVERTICULOSIS, COLON 2008  ? Dizziness   ? Family history of breast cancer   ? Family history of lung cancer   ? Family history of lymphoma   ? Family history of pancreatic cancer   ? Family history of uterine cancer   ? GERD   ? Hx of radiation therapy 10/19/11 -12/03/11  ? right breast  ? HYPERLIPIDEMIA   ? IRRITABLE BOWEL SYNDROME, HX OF   ? Left-sided carotid artery disease ([place])   ? moderate left ICA stenosis  ? OSTEOARTHRITIS, HAND   ? PSVT (paroxysmal supraventricular tachycardia) ([place])   ? symptomatic on event monitor  ? ? ?SURGICAL HISTORY: ?Past Surgical History:  ?Procedure Laterality Date  ? ABDOMINAL HYSTERECTOMY    ? APPENDECTOMY    ? BREAST BIOPSY  11/14/10   ? r breast: inv, insitu mammary carcinoma w/calcif, er/pr +, her2 -  ? BREAST SURGERY    ? lumpectomy  ? CATARACT EXTRACTION    ? both eyes  ? ELECTROPHYSIOLOGIC STUDY N/A 05/10/2015  ? Procedure: SVT Ablation;  Surgeon: Will Meredith Leeds, MD;  Location: [facility];  Service: Cardiovascular;  Laterality: N/A;  ? HERNIA REPAIR    ? inguinal herniorrhapy  1984  ? left  ? IR US GUIDE BX ASP/DRAIN  05/26/2019  ? rectal fissure repair    ? s/p benign  breast biopsy  2003  ? right  ? s/p left foot surgury  2009  ? s/p lumpectomy  1997  ? melignant left x 2  ? spiral fx left foot  2008  ? no surgury  ? TMJ ARTHROPLASTY  1989  ? TONGUE SURGERY    ? 59- to remo

## 2021-05-09 LAB — CANCER ANTIGEN 27.29: CA 27.29: 50.2 U/mL — ABNORMAL HIGH (ref 0.0–38.6)

## 2021-05-16 ENCOUNTER — Encounter: Payer: Self-pay | Admitting: Internal Medicine

## 2021-05-16 ENCOUNTER — Ambulatory Visit (INDEPENDENT_AMBULATORY_CARE_PROVIDER_SITE_OTHER): Payer: Medicare Other | Admitting: Internal Medicine

## 2021-05-16 ENCOUNTER — Ambulatory Visit (INDEPENDENT_AMBULATORY_CARE_PROVIDER_SITE_OTHER): Payer: Medicare Other

## 2021-05-16 VITALS — BP 110/60 | HR 79 | Temp 98.6°F | Ht 63.0 in | Wt 143.0 lb

## 2021-05-16 DIAGNOSIS — J449 Chronic obstructive pulmonary disease, unspecified: Secondary | ICD-10-CM | POA: Diagnosis not present

## 2021-05-16 DIAGNOSIS — J069 Acute upper respiratory infection, unspecified: Secondary | ICD-10-CM

## 2021-05-16 DIAGNOSIS — J329 Chronic sinusitis, unspecified: Secondary | ICD-10-CM

## 2021-05-16 DIAGNOSIS — J439 Emphysema, unspecified: Secondary | ICD-10-CM | POA: Diagnosis not present

## 2021-05-16 MED ORDER — AMOXICILLIN 500 MG PO CAPS: 1000.0000 mg | ORAL_CAPSULE | Freq: Two times a day (BID) | ORAL | 0 refills | Status: AC

## 2021-05-16 MED ORDER — HYDROCODONE BIT-HOMATROP MBR 5-1.5 MG/5ML PO SOLN: 5.0000 mL | Freq: Four times a day (QID) | ORAL | 0 refills | Status: AC | PRN

## 2021-05-16 MED ORDER — PREDNISONE 10 MG PO TABS: ORAL_TABLET | ORAL | 0 refills | Status: DC

## 2021-05-16 NOTE — Patient Instructions (Signed)
Please take all new medication as prescribed - the antibiotic, prednisone, and cough medicine as needed ? ?Please continue all other medications as before, and refills have been done if requested. ? ?Please have the pharmacy call with any other refills you may need. ? ?Please continue your efforts at being more active, low cholesterol diet, and weight control. ? ?Please keep your appointments with your specialists as you may have planned ? ?Please go to the XRAY Department in the first floor for the x-ray testing ? ?You will be contacted by phone if any changes need to be made immediately.  Otherwise, you will receive a letter about your results with an explanation, but please check with MyChart first. ? ?Please remember to sign up for MyChart if you have not done so, as this will be important to you in the future with finding out test results, communicating by private email, and scheduling acute appointments online when needed. ? ? ?

## 2021-05-17 NOTE — Assessment & Plan Note (Signed)
Also for prednisone asd  to f/u any worsening symptoms or concerns ?

## 2021-05-17 NOTE — Assessment & Plan Note (Signed)
Mild to mod, for antibx course,  to f/u any worsening symptoms or concerns 

## 2021-05-17 NOTE — Progress Notes (Signed)
Patient ID: Stacey [name], female   DOB: [birth year], 81 y.o.   MRN: [id number] ? ? ? ?    Chief Complaint: follow up uri, allergy symptoms, copd with cough ? ?     HPI:  Stacey [name] is a 81 y.o. female here acute ill;  Here with 2-3 days acute onset fever, facial pain, pressure, headache, general weakness and malaise, and greenish d/c, with mild ST and cough, but pt denies chest pain, wheezing, increased sob or doe, orthopnea, PND, increased LE swelling, palpitations, dizziness or syncope.  Does have several wks ongoing nasal allergy symptoms with clearish congestion, itch and sneezing, without fever, pain, ST, cough, swelling or wheezing.  ?      ?Wt Readings from Last 3 Encounters:  ?05/16/21 143 lb (64.9 kg)  ?05/08/21 145 lb 11.2 oz (66.1 kg)  ?03/13/21 144 lb 8 oz (65.5 kg)  ? ?BP Readings from Last 3 Encounters:  ?05/16/21 110/60  ?05/08/21 131/83  ?04/10/21 (!) 138/56  ? ?      ?Past Medical History:  ?Diagnosis Date  ? ANEMIA-NOS   ? ANXIETY   ? Breast cancer ([place]) 1997 L, 2012 R  ? s/p chemo/xrt  ? COPD   ? resolved  ? DIVERTICULOSIS, COLON 2008  ? Dizziness   ? Family history of breast cancer   ? Family history of lung cancer   ? Family history of lymphoma   ? Family history of pancreatic cancer   ? Family history of uterine cancer   ? GERD   ? Hx of radiation therapy 10/19/11 -12/03/11  ? right breast  ? HYPERLIPIDEMIA   ? IRRITABLE BOWEL SYNDROME, HX OF   ? Left-sided carotid artery disease ([place])   ? moderate left ICA stenosis  ? OSTEOARTHRITIS, HAND   ? PSVT (paroxysmal supraventricular tachycardia) ([place])   ? symptomatic on event monitor  ? ?Past Surgical History:  ?Procedure Laterality Date  ? ABDOMINAL HYSTERECTOMY    ? APPENDECTOMY    ? BREAST BIOPSY  11/14/10   ? r breast: inv, insitu mammary carcinoma w/calcif, er/pr +, her2 -  ? BREAST SURGERY    ? lumpectomy  ? CATARACT EXTRACTION    ? both eyes  ? ELECTROPHYSIOLOGIC STUDY N/A 05/10/2015  ? Procedure: SVT Ablation;  Surgeon: Will Meredith Leeds, MD;   Location: [facility];  Service: Cardiovascular;  Laterality: N/A;  ? HERNIA REPAIR    ? inguinal herniorrhapy  1984  ? left  ? IR US GUIDE BX ASP/DRAIN  05/26/2019  ? rectal fissure repair    ? s/p benign breast biopsy  2003  ? right  ? s/p left foot surgury  2009  ? s/p lumpectomy  1997  ? melignant left x 2  ? spiral fx left foot  2008  ? no surgury  ? TMJ ARTHROPLASTY  1989  ? TONGUE SURGERY    ? 1988- to remove scar tissue growth   ? TONSILLECTOMY    ? ? reports that she quit smoking about 5 years ago. Her smoking use included cigarettes. She has a 54.00 pack-year smoking history. She has never used smokeless tobacco. She reports current alcohol use. She reports that she does not use drugs. ?family history includes Brain cancer in her cousin; Breast cancer in her cousin, cousin, and cousin; Colon polyps in her mother; Diabetes in her mother; Hypertension in her mother; Lung cancer in her maternal grandfather and paternal uncle; Lung cancer (age of onset: 53) in her  maternal grandmother; Lymphoma in her brother; Pancreatic cancer (age of onset: 15) in her mother; Stroke in her mother; Testicular cancer in her cousin; Uterine cancer (age of onset: 43) in her mother. ?Allergies  ?Allergen Reactions  ? Aminoglycosides   ? Bee Venom Swelling  ? Clindamycin/Lincomycin Diarrhea and Nausea And Vomiting  ? Codeine Nausea And Vomiting  ? ?Current Outpatient Medications on File Prior to Visit  ?Medication Sig Dispense Refill  ? anastrozole (ARIMIDEX) 1 MG tablet TAKE 1 TABLET(1 MG) BY MOUTH DAILY 90 tablet 4  ? aspirin 81 MG EC tablet Take 81 mg by mouth daily.    ? calcium-vitamin D (OSCAL WITH D) 500-200 MG-UNIT per tablet Take 1 tablet by mouth daily.    ? cholecalciferol (VITAMIN D) 1000 UNITS tablet Take 1,000 Units by mouth daily.    ? cholestyramine (QUESTRAN) 4 g packet MIX AND DRINK 1 PACKET(4 GRAMS) BY MOUTH THREE TIMES DAILY 60 each 12  ? clotrimazole-betamethasone (LOTRISONE) cream Apply 1 application  topically 2 (two) times daily as needed. 30 g 1  ? diphenhydrAMINE (BENADRYL) 25 MG tablet Take 25 mg by mouth every 6 (six) hours as needed (for bee stings).     ? fulvestrant (FASLODEX) 250 MG/5ML injection Inject 250 mg into the muscle once. One injection each buttock over 1-2 minutes. Warm prior to use.    ? loperamide (IMODIUM) 2 MG capsule Take 2 tablets (4 mg) by mouth for first loose stool, then 1 tablet (2 mg) for every loose stool thereafter. Do not exceed 8 tablets (16 mg) in a 24 hour period. Stop loperamide if 12 hours have passed without a loose stool. 30 capsule   ? loratadine (CLARITIN) 10 MG tablet Take 10 mg by mouth daily.    ? Magnesium Oxide 250 MG TABS Take 500 mg by mouth once daily. Administer (preferably with food) at least 2 hours apart from other medications. 30 tablet 0  ? Multiple Vitamin (MULTIVITAMIN) capsule Take 1 capsule by mouth daily.    ? omeprazole (PRILOSEC) 20 MG capsule Take 1 capsule by mouth daily 90 capsule 2  ? palbociclib (IBRANCE) 100 MG tablet Take 1 tablet (100 mg total) by mouth Monday through Friday for 3 weeks, then one week off, then repeat 15 tablet 6  ? prochlorperazine (COMPAZINE) 10 MG tablet Take 1 tablet (10 mg total) by mouth every 6 (six) hours as needed for nausea or vomiting. 30 tablet 1  ? pseudoephedrine (SUDAFED) 30 MG tablet Take 30 mg by mouth every 4 (four) hours as needed for congestion.    ? SPIRIVA RESPIMAT 2.5 MCG/ACT AERS INHALE 2 PUFFS INTO THE LUNGS DAILY 4 g 11  ? telmisartan (MICARDIS) 40 MG tablet TAKE 1 TABLET(40 MG) BY MOUTH DAILY 90 tablet 0  ? triamcinolone (NASACORT) 55 MCG/ACT AERO nasal inhaler Place 2 sprays into the nose daily. 2 sprays each nostril at night before bedtime 1 each 12  ? triamterene-hydrochlorothiazide (DYAZIDE) 37.5-25 MG capsule TAKE 1 CAPSULE BY MOUTH EVERY MORNING 90 capsule 4  ? zolpidem (AMBIEN) 10 MG tablet TAKE 1 TABLET(10 MG) BY MOUTH AT BEDTIME AS NEEDED 90 tablet 1  ? ?No current facility-administered  medications on file prior to visit.  ? ?     ROS:  All others reviewed and negative. ? ?Objective  ? ?     PE:  BP 110/60 (BP Location: Right Arm, Patient Position: Sitting, Cuff Size: Large)   Pulse 79   Temp 98.6 ?F (37 ?C) (Oral)  Ht '5\' 3"'  (1.6 m)   Wt 143 lb (64.9 kg)   SpO2 98%   BMI 25.33 kg/m?  ? ?              Constitutional: Pt appears in NAD,, mil dill ?              HENT: Head: NCAT.  ?              Right Ear: External ear normal.   ?              Left Ear: External ear normal. Bilat tm's with mild erythema.  Max sinus areas mild tender.  Pharynx with mild erythema, no exudate ?              Eyes: . Pupils are equal, round, and reactive to light. Conjunctivae and EOM are normal ?              Nose: without d/c or deformity ?              Neck: Neck supple. Gross normal ROM ?              Cardiovascular: Normal rate and regular rhythm.   ?              Pulmonary/Chest: Effort normal and breath sounds without rales or wheezing.  ?              Abd:  Soft, NT, ND, + BS, no organomegaly ?              Neurological: Pt is alert. At baseline orientation, motor grossly intact ?              Skin: Skin is warm. No rashes, no other new lesions, LE edema - none ?              Psychiatric: Pt behavior is normal without agitation  ? ?Micro: none ? ?Cardiac tracings I have personally interpreted today:  none ? ?Pertinent Radiological findings (summarize): none  ? ?Lab Results  ?Component Value Date  ? WBC 3.4 (L) 05/08/2021  ? HGB 9.1 (L) 05/08/2021  ? HCT 26.9 (L) 05/08/2021  ? PLT 167 05/08/2021  ? GLUCOSE 102 (H) 05/08/2021  ? CHOL 193 07/13/2018  ? TRIG 231.0 (H) 07/13/2018  ? HDL 49.40 07/13/2018  ? LDLDIRECT 119.0 07/13/2018  ? [place] 84 07/04/2009  ? ALT 11 05/08/2021  ? AST 19 05/08/2021  ? NA 137 05/08/2021  ? K 3.9 05/08/2021  ? CL 106 05/08/2021  ? CREATININE 1.36 (H) 05/08/2021  ? BUN 31 (H) 05/08/2021  ? CO2 22 05/08/2021  ? TSH 3.41 07/13/2018  ? INR 1.1 05/26/2019  ? HGBA1C 6.6 (H) 07/13/2018   ? ?Assessment/Plan:  ?Stacey [name] is a 81 y.o. White or Caucasian [1] female with  has a past medical history of ANEMIA-NOS, ANXIETY, Breast cancer ([place]) (1997 L, 2012 R), COPD, DIVERTICULOSIS, COLON (2008), Sharma Covert

## 2021-05-17 NOTE — Assessment & Plan Note (Addendum)
No wheezing, stable overall, cont inhalers asd, for cxr f/o subclinical pna ?

## 2021-06-04 ENCOUNTER — Other Ambulatory Visit: Payer: Self-pay | Admitting: *Deleted

## 2021-06-04 DIAGNOSIS — C50212 Malignant neoplasm of upper-inner quadrant of left female breast: Secondary | ICD-10-CM

## 2021-06-05 ENCOUNTER — Inpatient Hospital Stay: Payer: Medicare Other

## 2021-06-05 ENCOUNTER — Inpatient Hospital Stay: Payer: Medicare Other | Attending: Adult Health

## 2021-06-05 ENCOUNTER — Other Ambulatory Visit: Payer: Self-pay | Admitting: *Deleted

## 2021-06-05 ENCOUNTER — Other Ambulatory Visit: Payer: Self-pay

## 2021-06-05 VITALS — BP 127/56 | HR 97 | Temp 98.4°F | Resp 18

## 2021-06-05 DIAGNOSIS — Z79818 Long term (current) use of other agents affecting estrogen receptors and estrogen levels: Secondary | ICD-10-CM | POA: Diagnosis not present

## 2021-06-05 DIAGNOSIS — Z170 Estrogen receptor positive status [ER+]: Secondary | ICD-10-CM | POA: Diagnosis not present

## 2021-06-05 DIAGNOSIS — C50211 Malignant neoplasm of upper-inner quadrant of right female breast: Secondary | ICD-10-CM | POA: Diagnosis not present

## 2021-06-05 DIAGNOSIS — C50212 Malignant neoplasm of upper-inner quadrant of left female breast: Secondary | ICD-10-CM

## 2021-06-05 DIAGNOSIS — C7951 Secondary malignant neoplasm of bone: Secondary | ICD-10-CM

## 2021-06-05 LAB — CMP (CANCER CENTER ONLY)
ALT: 11 U/L (ref 0–44)
AST: 18 U/L (ref 15–41)
Albumin: 4 g/dL (ref 3.5–5.0)
Alkaline Phosphatase: 56 U/L (ref 38–126)
Anion gap: 11 (ref 5–15)
BUN: 31 mg/dL — ABNORMAL HIGH (ref 8–23)
CO2: 22 mmol/L (ref 22–32)
Calcium: 9.6 mg/dL (ref 8.9–10.3)
Chloride: 105 mmol/L (ref 98–111)
Creatinine: 1.52 mg/dL — ABNORMAL HIGH (ref 0.44–1.00)
GFR, Estimated: 34 mL/min — ABNORMAL LOW (ref >60)
Glucose, Bld: 114 mg/dL — ABNORMAL HIGH (ref 70–99)
Potassium: 4.1 mmol/L (ref 3.5–5.1)
Sodium: 138 mmol/L (ref 135–145)
Total Bilirubin: 0.3 mg/dL (ref 0.3–1.2)
Total Protein: 7.6 g/dL (ref 6.5–8.1)

## 2021-06-05 LAB — CBC WITH DIFFERENTIAL (CANCER CENTER ONLY)
Abs Immature Granulocytes: 0.01 10*3/uL (ref 0.00–0.07)
Basophils Absolute: 0.1 10*3/uL (ref 0.0–0.1)
Basophils Relative: 2 %
Eosinophils Absolute: 0 10*3/uL (ref 0.0–0.5)
Eosinophils Relative: 1 %
HCT: 28.1 % — ABNORMAL LOW (ref 36.0–46.0)
Hemoglobin: 9.5 g/dL — ABNORMAL LOW (ref 12.0–15.0)
Immature Granulocytes: 0 %
Lymphocytes Relative: 18 %
Lymphs Abs: 0.6 10*3/uL — ABNORMAL LOW (ref 0.7–4.0)
MCH: 36.3 pg — ABNORMAL HIGH (ref 26.0–34.0)
MCHC: 33.8 g/dL (ref 30.0–36.0)
MCV: 107.3 fL — ABNORMAL HIGH (ref 80.0–100.0)
Monocytes Absolute: 0.5 10*3/uL (ref 0.1–1.0)
Monocytes Relative: 16 %
Neutro Abs: 2 10*3/uL (ref 1.7–7.7)
Neutrophils Relative %: 63 %
Platelet Count: 214 10*3/uL (ref 150–400)
RBC: 2.62 MIL/uL — ABNORMAL LOW (ref 3.87–5.11)
RDW: 14.3 % (ref 11.5–15.5)
WBC Count: 3.2 10*3/uL — ABNORMAL LOW (ref 4.0–10.5)
nRBC: 0 % (ref 0.0–0.2)

## 2021-06-05 LAB — MAGNESIUM: Magnesium: 1.2 mg/dL — ABNORMAL LOW (ref 1.7–2.4)

## 2021-06-05 MED ORDER — FULVESTRANT 250 MG/5ML IM SOSY
500.0000 mg | PREFILLED_SYRINGE | Freq: Once | INTRAMUSCULAR | Status: AC
  Administered 2021-06-05: 500 mg via INTRAMUSCULAR
  Filled 2021-06-05: qty 10

## 2021-06-06 ENCOUNTER — Telehealth: Payer: Self-pay | Admitting: *Deleted

## 2021-06-06 MED ORDER — MAGNESIUM OXIDE 250 MG PO TABS: ORAL_TABLET | ORAL | 3 refills | Status: DC

## 2021-06-06 NOTE — Telephone Encounter (Signed)
This RN attempted to call pt with noted magnesium level of 1.2 - obtained VM. ? ?Detailed message left stating lab reading and need to verify if pt is taking magnesium due to her med list shows she was only given a prescription for 30 tabs with no refills. Due to unsure if she has magnesium in home a refill will be called in. ? ?This RN called pharmacy and was informed pt does not have an prior magnesium prescription. ? ?Prescription for magnesium sent to her pharmacy. ?

## 2021-06-10 ENCOUNTER — Telehealth: Payer: Self-pay | Admitting: *Deleted

## 2021-06-10 ENCOUNTER — Other Ambulatory Visit: Payer: Self-pay | Admitting: *Deleted

## 2021-06-10 DIAGNOSIS — Z170 Estrogen receptor positive status [ER+]: Secondary | ICD-10-CM

## 2021-06-10 NOTE — Telephone Encounter (Signed)
This RN spoke with pt per her return call from 06/06/2021 by message left from this RN. ? ?Stacey [name] states she takes OTC magnesium citrate tablets of 750 mg. ? ?She states overall diarrhea is controled with some days having increased amount but overall she states she is managing well. ? ?She denies any symptoms she has had previously such as muscle cramps when magnesium was low. ? ?Per phone discussion pt states she would like to wait for lab results on lab/inj day and if magnesium is too low with symptoms get additional magnesium by IV. ? ?This RN will put above on face page as well appt note. ? ?No other needs at this time. ?

## 2021-06-16 ENCOUNTER — Emergency Department (HOSPITAL_COMMUNITY): Payer: Medicare Other

## 2021-06-16 ENCOUNTER — Other Ambulatory Visit: Payer: Self-pay

## 2021-06-16 ENCOUNTER — Emergency Department (HOSPITAL_COMMUNITY)
Admission: EM | Admit: 2021-06-16 | Discharge: 2021-06-16 | Disposition: A | Discharge status: Home / Self Care | Payer: Medicare Other | Attending: Emergency Medicine | Admitting: Emergency Medicine

## 2021-06-16 DIAGNOSIS — R079 Chest pain, unspecified: Secondary | ICD-10-CM | POA: Diagnosis not present

## 2021-06-16 DIAGNOSIS — Z7982 Long term (current) use of aspirin: Secondary | ICD-10-CM | POA: Insufficient documentation

## 2021-06-16 DIAGNOSIS — M549 Dorsalgia, unspecified: Secondary | ICD-10-CM | POA: Diagnosis not present

## 2021-06-16 DIAGNOSIS — M25511 Pain in right shoulder: Secondary | ICD-10-CM | POA: Diagnosis not present

## 2021-06-16 DIAGNOSIS — R918 Other nonspecific abnormal finding of lung field: Secondary | ICD-10-CM | POA: Diagnosis not present

## 2021-06-16 DIAGNOSIS — I700 Atherosclerosis of aorta: Secondary | ICD-10-CM | POA: Diagnosis not present

## 2021-06-16 DIAGNOSIS — Z853 Personal history of malignant neoplasm of breast: Secondary | ICD-10-CM | POA: Insufficient documentation

## 2021-06-16 DIAGNOSIS — M546 Pain in thoracic spine: Secondary | ICD-10-CM | POA: Diagnosis not present

## 2021-06-16 DIAGNOSIS — I10 Essential (primary) hypertension: Secondary | ICD-10-CM | POA: Diagnosis not present

## 2021-06-16 DIAGNOSIS — R0781 Pleurodynia: Secondary | ICD-10-CM | POA: Diagnosis not present

## 2021-06-16 DIAGNOSIS — C50919 Malignant neoplasm of unspecified site of unspecified female breast: Secondary | ICD-10-CM | POA: Diagnosis not present

## 2021-06-16 DIAGNOSIS — J439 Emphysema, unspecified: Secondary | ICD-10-CM | POA: Diagnosis not present

## 2021-06-16 LAB — CBC WITH DIFFERENTIAL/PLATELET
Abs Immature Granulocytes: 0.03 10*3/uL (ref 0.00–0.07)
Basophils Absolute: 0 10*3/uL (ref 0.0–0.1)
Basophils Relative: 0 %
Eosinophils Absolute: 0 10*3/uL (ref 0.0–0.5)
Eosinophils Relative: 0 %
HCT: 30.3 % — ABNORMAL LOW (ref 36.0–46.0)
Hemoglobin: 10.5 g/dL — ABNORMAL LOW (ref 12.0–15.0)
Immature Granulocytes: 1 %
Lymphocytes Relative: 13 %
Lymphs Abs: 0.4 10*3/uL — ABNORMAL LOW (ref 0.7–4.0)
MCH: 37.1 pg — ABNORMAL HIGH (ref 26.0–34.0)
MCHC: 34.7 g/dL (ref 30.0–36.0)
MCV: 107.1 fL — ABNORMAL HIGH (ref 80.0–100.0)
Monocytes Absolute: 0.2 10*3/uL (ref 0.1–1.0)
Monocytes Relative: 7 %
Neutro Abs: 2.5 10*3/uL (ref 1.7–7.7)
Neutrophils Relative %: 79 %
Platelets: 220 10*3/uL (ref 150–400)
RBC: 2.83 MIL/uL — ABNORMAL LOW (ref 3.87–5.11)
RDW: 14.2 % (ref 11.5–15.5)
WBC: 3.2 10*3/uL — ABNORMAL LOW (ref 4.0–10.5)
nRBC: 0 % (ref 0.0–0.2)

## 2021-06-16 LAB — COMPREHENSIVE METABOLIC PANEL
ALT: 14 U/L (ref 0–44)
AST: 27 U/L (ref 15–41)
Albumin: 4.3 g/dL (ref 3.5–5.0)
Alkaline Phosphatase: 54 U/L (ref 38–126)
Anion gap: 12 (ref 5–15)
BUN: 37 mg/dL — ABNORMAL HIGH (ref 8–23)
CO2: 17 mmol/L — ABNORMAL LOW (ref 22–32)
Calcium: 9.9 mg/dL (ref 8.9–10.3)
Chloride: 107 mmol/L (ref 98–111)
Creatinine, Ser: 1.16 mg/dL — ABNORMAL HIGH (ref 0.44–1.00)
GFR, Estimated: 47 mL/min — ABNORMAL LOW (ref >60)
Glucose, Bld: 164 mg/dL — ABNORMAL HIGH (ref 70–99)
Potassium: 4.6 mmol/L (ref 3.5–5.1)
Sodium: 136 mmol/L (ref 135–145)
Total Bilirubin: 0.7 mg/dL (ref 0.3–1.2)
Total Protein: 8.3 g/dL — ABNORMAL HIGH (ref 6.5–8.1)

## 2021-06-16 LAB — TROPONIN I (HIGH SENSITIVITY)
Troponin I (High Sensitivity): 14 ng/L (ref <18)
Troponin I (High Sensitivity): 20 ng/L — ABNORMAL HIGH (ref <18)

## 2021-06-16 MED ORDER — LIDOCAINE 5 % EX PTCH
1.0000 | MEDICATED_PATCH | CUTANEOUS | Status: DC
  Administered 2021-06-16: 1 via TRANSDERMAL
  Filled 2021-06-16: qty 1

## 2021-06-16 MED ORDER — IOHEXOL 300 MG/ML  SOLN
75.0000 mL | Freq: Once | INTRAMUSCULAR | Status: AC | PRN
  Administered 2021-06-16: 75 mL via INTRAVENOUS

## 2021-06-16 MED ORDER — SODIUM CHLORIDE (PF) 0.9 % IJ SOLN
INTRAMUSCULAR | Status: AC
  Filled 2021-06-16: qty 50

## 2021-06-16 NOTE — ED Provider Notes (Addendum)
?[facility] DEPT ?Provider Note ? ? ?CSN: [id number] ?Arrival date & time: 06/16/21  0818 ? ?  ? ?History ? ?Chief Complaint  ?Patient presents with  ? Shoulder Pain  ? ? ?Stacey [name] is a 81 y.o. female. ? 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 She states that yesterday afternoon when she was having a bowel movement she noticed some discomfort in her right upper back and rib area. States she has never had pain like this before. Denies any known injury or overuse. States that she took 2 rounds of Tylenol yesterday without relief of her symptoms. States that the pain is worse with direct pressure and she has been having to sit with a pillow against her back for relief. Denies any pain with movement or breathing. No chest pain or shortness of breath. States that she is concerned that she has metastasis to her ribs but states that this pain is different from the pain from her scapula mets. She states she is currently on immunotherapy for management of her cancer, states this gives her diarrhea which is normal for her.  ? ?The history is provided by the patient. No language interpreter was used.  ?Shoulder Pain ?Associated symptoms: no fever   ? ?  ? ?Home Medications ?Prior to Admission medications   ?Medication Sig Start Date End Date Taking? Authorizing Provider  ?anastrozole (ARIMIDEX) 1 MG tablet TAKE 1 TABLET(1 MG) BY MOUTH DAILY 06/28/20   Magrinat, Virgie Dad, MD  ?aspirin 81 MG EC tablet Take 81 mg by mouth daily.    [provider]  ?calcium-vitamin D (OSCAL WITH D) 500-200 MG-UNIT per tablet Take 1 tablet by mouth daily.    [provider]  ?cholecalciferol (VITAMIN D) 1000 UNITS tablet Take 1,000 Units by mouth daily.    [provider]  ?cholestyramine (QUESTRAN) 4 g packet MIX AND DRINK 1 PACKET(4 GRAMS) BY MOUTH THREE TIMES DAILY 09/05/20   Gardenia Phlegm, NP  ?clotrimazole-betamethasone (LOTRISONE) cream Apply 1 application topically 2 (two) times daily as needed. 03/31/21   Biagio Borg, MD  ?diphenhydrAMINE (BENADRYL) 25 MG tablet Take 25 mg by mouth every 6 (six) hours as needed (for bee stings).     [provider]  ?fulvestrant (FASLODEX) 250 MG/5ML injection Inject 250 mg into the muscle once. One injection each buttock over 1-2 minutes. Warm prior to use. 11/23/19   [provider]  ?loperamide (IMODIUM) 2 MG capsule Take 2 tablets (4 mg) by mouth for first loose stool, then 1 tablet (2 mg) for every loose stool thereafter. Do not exceed 8 tablets (16 mg) in a 24 hour period. Stop loperamide if 12 hours have passed without a loose stool. 09/27/20   Magrinat, Virgie Dad, MD  ?loratadine (CLARITIN) 10 MG tablet Take 10 mg by mouth daily.    [provider]  ?MAGNESIUM CITRATE PO Take 750 mg by mouth daily.    [provider]  ?Multiple Vitamin (MULTIVITAMIN) capsule Take 1 capsule by mouth daily.    [provider]  ?omeprazole (PRILOSEC) 20 MG capsule Take 1 capsule by mouth daily 12/11/20   Biagio Borg, MD  ?palbociclib [facility]) 100 MG tablet Take 1 tablet (100 mg total) by mouth Monday through Friday for 3 weeks, then one week off, then repeat 11/15/19   Magrinat, Virgie Dad, MD  ?predniSONE (DELTASONE) 10 MG tablet 2 tabs  by mouth per day for 5 days 05/16/21   Biagio Borg, MD  ?prochlorperazine (COMPAZINE) 10 MG tablet Take 1 tablet (10 mg total) by mouth every 6 (six) hours as needed for nausea or vomiting. 06/26/19   Magrinat, Virgie Dad, MD  ?pseudoephedrine (SUDAFED) 30 MG tablet Take 30 mg by mouth every 4 (four) hours as needed for congestion.    [provider]  ?SPIRIVA RESPIMAT 2.5 MCG/ACT AERS INHALE 2 PUFFS INTO THE LUNGS DAILY 12/25/20   Tanda Rockers, MD  ?telmisartan (MICARDIS) 40 MG tablet TAKE 1 TABLET(40 MG) BY MOUTH DAILY 03/25/21   Biagio Borg, MD  ?triamcinolone  (NASACORT) 55 MCG/ACT AERO nasal inhaler Place 2 sprays into the nose daily. 2 sprays each nostril at night before bedtime 10/14/20   Rozetta Nunnery, MD  ?triamterene-hydrochlorothiazide (DYAZIDE) 37.5-25 MG capsule TAKE 1 CAPSULE BY MOUTH EVERY MORNING 12/30/20   Magrinat, Virgie Dad, MD  ?zolpidem (AMBIEN) 10 MG tablet TAKE 1 TABLET(10 MG) BY MOUTH AT BEDTIME AS NEEDED 04/01/21   Biagio Borg, MD  ?   ? ?Allergies    ?Aminoglycosides, Bee venom, Clindamycin/lincomycin, and Codeine   ? ?Review of Systems   ?Review of Systems  ?Constitutional:  Negative for chills and fever.  ?Respiratory:  Negative for cough and shortness of breath.   ?Cardiovascular:  Negative for leg swelling.  ?All other systems reviewed and are negative. ? ?Physical Exam ?Updated Vital Signs ?BP (!) 165/75   Pulse 89   Temp 98 ?F (36.7 ?C)   Resp (!) 24   Ht '5\' 3"'$  (1.6 m)   Wt 62.6 kg   SpO2 98%   BMI 24.45 kg/m?  ?Physical Exam ?Vitals and nursing note reviewed.  ?Constitutional:   ?   General: She is not in acute distress. ?   Appearance: Normal appearance. She is normal weight. She is not ill-appearing, toxic-appearing or diaphoretic.  ?HENT:  ?   Head: Normocephalic and atraumatic.  ?Cardiovascular:  ?   Rate and Rhythm: Normal rate and regular rhythm.  ?   Heart sounds: Normal heart sounds.  ?Pulmonary:  ?   Effort: Pulmonary effort is normal. No respiratory distress.  ?   Breath sounds: Normal breath sounds.  ?Chest:  ?   Comments: Tenderness noted to palpation of the right side upper back area along the ribcage. No overlying skin changes or obvious deformity noted. ?Musculoskeletal:     ?   General: Normal range of motion.  ?   Cervical back: Normal range of motion.  ?   Comments: Full painless ROM noted to right arm and shoulder. Radial pulse intact and 2+  ?Skin: ?   General: Skin is warm and dry.  ?Neurological:  ?   General: No focal deficit present.  ?   Mental Status: She is alert.  ?Psychiatric:     ?   Mood and Affect:  Mood normal.     ?   Behavior: Behavior normal.  ? ? ?ED Results / Procedures / Treatments   ?Labs ?(all labs ordered are listed, but only abnormal results are displayed) ?Labs Reviewed  ?CBC WITH DIFFERENTIAL/PLATELET - Abnormal; Notable for the following components:  ?    Result Value  ? WBC 3.2 (*)   ? RBC 2.83 (*)   ? Hemoglobin 10.5 (*)   ? HCT 30.3 (*)   ? MCV 107.1 (*)   ? MCH 37.1 (*)   ? Lymphs Abs 0.4 (*)   ?  All other components within normal limits  ?COMPREHENSIVE METABOLIC PANEL - Abnormal; Notable for the following components:  ? CO2 17 (*)   ? Glucose, Bld 164 (*)   ? BUN 37 (*)   ? Creatinine, Ser 1.16 (*)   ? Total Protein 8.3 (*)   ? GFR, Estimated 47 (*)   ? All other components within normal limits  ?TROPONIN I (HIGH SENSITIVITY)  ?TROPONIN I (HIGH SENSITIVITY)  ? ? ?EKG ?None ? ?Radiology ?DG Chest 2 View ? ?Result Date: 06/16/2021 ?CLINICAL DATA:  right sided back/rib pain hx of metastatic breast cancer EXAM: CHEST - 2 VIEW COMPARISON:  05/16/2021 FINDINGS: Stable cardiomediastinal contours. Hyperinflated lungs. No focal airspace consolidation, pleural effusion, or pneumothorax. Exaggerated thoracic kyphosis. Numerous surgical clips in the left axillary region and left breast. IMPRESSION: No active cardiopulmonary disease. Electronically Signed   By: Davina Poke D.O.   On: 06/16/2021 09:39  ? ?CT Chest W Contrast ? ?Result Date: 06/16/2021 ?CLINICAL DATA:  81 year old female with history of shoulder pain. History of metastatic lobular breast cancer. * Tracking Code: BO * EXAM: CT CHEST WITH CONTRAST TECHNIQUE: Multidetector CT imaging of the chest was performed during intravenous contrast administration. RADIATION DOSE REDUCTION: This exam was performed according to the departmental dose-optimization program which includes automated exposure control, adjustment of the mA and/or kV according to patient size and/or use of iterative reconstruction technique. CONTRAST:  41m OMNIPAQUE IOHEXOL  300 MG/ML  SOLN COMPARISON:  Chest CT 09/09/2020.  PET-CT 04/15/2021. FINDINGS: Cardiovascular: Heart size is normal. There is no significant pericardial fluid, thickening or pericardial calcification. There is aorti

## 2021-06-16 NOTE — Discharge Instructions (Signed)
As we discussed, your work-up in the ER today was reassuring for acute abnormalities.  Imaging and lab work were without acute emergent concerns.  I have given you a lidocaine patch to wear for management of your pain.  I also highly recommend that you continue to take Tylenol.  You will need to call your primary care doctor and schedule an appointment for continued evaluation and management of your symptoms. ? ?Return if development of any new or worsening symptoms. ?

## 2021-06-16 NOTE — ED Triage Notes (Signed)
Ems brings patient from home for right shoulder pain. States this pain started last night. Pt denies injury. ?

## 2021-06-19 ENCOUNTER — Telehealth: Payer: Self-pay | Admitting: Internal Medicine

## 2021-06-19 NOTE — Telephone Encounter (Signed)
Connected to [facility] 5.15.2023.  -Caller states that she's having very bad constant pain under her right shoulder and back since yesterday and she's also having bad diarrhea, she has a history of gallstones also along with having breast cancer, Caller states no fever but she has chills; denies other Symptoms.  Advised to call EMS

## 2021-07-03 ENCOUNTER — Inpatient Hospital Stay (HOSPITAL_BASED_OUTPATIENT_CLINIC_OR_DEPARTMENT_OTHER): Payer: Medicare Other | Admitting: Hematology and Oncology

## 2021-07-03 ENCOUNTER — Inpatient Hospital Stay: Payer: Medicare Other

## 2021-07-03 ENCOUNTER — Inpatient Hospital Stay: Payer: Medicare Other | Attending: Adult Health

## 2021-07-03 ENCOUNTER — Other Ambulatory Visit: Payer: Self-pay

## 2021-07-03 ENCOUNTER — Encounter: Payer: Self-pay | Admitting: Hematology and Oncology

## 2021-07-03 VITALS — BP 120/54 | HR 88 | Temp 97.9°F | Wt 141.3 lb

## 2021-07-03 DIAGNOSIS — I10 Essential (primary) hypertension: Secondary | ICD-10-CM | POA: Diagnosis not present

## 2021-07-03 DIAGNOSIS — I890 Lymphedema, not elsewhere classified: Secondary | ICD-10-CM | POA: Diagnosis not present

## 2021-07-03 DIAGNOSIS — M8580 Other specified disorders of bone density and structure, unspecified site: Secondary | ICD-10-CM | POA: Diagnosis not present

## 2021-07-03 DIAGNOSIS — E785 Hyperlipidemia, unspecified: Secondary | ICD-10-CM | POA: Diagnosis not present

## 2021-07-03 DIAGNOSIS — I471 Supraventricular tachycardia: Secondary | ICD-10-CM | POA: Diagnosis not present

## 2021-07-03 DIAGNOSIS — Z800 Family history of malignant neoplasm of digestive organs: Secondary | ICD-10-CM | POA: Insufficient documentation

## 2021-07-03 DIAGNOSIS — Z87891 Personal history of nicotine dependence: Secondary | ICD-10-CM | POA: Insufficient documentation

## 2021-07-03 DIAGNOSIS — C7951 Secondary malignant neoplasm of bone: Secondary | ICD-10-CM

## 2021-07-03 DIAGNOSIS — Z803 Family history of malignant neoplasm of breast: Secondary | ICD-10-CM | POA: Insufficient documentation

## 2021-07-03 DIAGNOSIS — Z79811 Long term (current) use of aromatase inhibitors: Secondary | ICD-10-CM | POA: Diagnosis not present

## 2021-07-03 DIAGNOSIS — K219 Gastro-esophageal reflux disease without esophagitis: Secondary | ICD-10-CM | POA: Insufficient documentation

## 2021-07-03 DIAGNOSIS — R590 Localized enlarged lymph nodes: Secondary | ICD-10-CM | POA: Diagnosis not present

## 2021-07-03 DIAGNOSIS — Z170 Estrogen receptor positive status [ER+]: Secondary | ICD-10-CM | POA: Insufficient documentation

## 2021-07-03 DIAGNOSIS — Z801 Family history of malignant neoplasm of trachea, bronchus and lung: Secondary | ICD-10-CM | POA: Diagnosis not present

## 2021-07-03 DIAGNOSIS — R739 Hyperglycemia, unspecified: Secondary | ICD-10-CM | POA: Insufficient documentation

## 2021-07-03 DIAGNOSIS — C50212 Malignant neoplasm of upper-inner quadrant of left female breast: Secondary | ICD-10-CM

## 2021-07-03 DIAGNOSIS — C50211 Malignant neoplasm of upper-inner quadrant of right female breast: Secondary | ICD-10-CM | POA: Insufficient documentation

## 2021-07-03 DIAGNOSIS — Z79818 Long term (current) use of other agents affecting estrogen receptors and estrogen levels: Secondary | ICD-10-CM | POA: Insufficient documentation

## 2021-07-03 DIAGNOSIS — K580 Irritable bowel syndrome with diarrhea: Secondary | ICD-10-CM | POA: Diagnosis not present

## 2021-07-03 LAB — CBC WITH DIFFERENTIAL (CANCER CENTER ONLY)
Abs Immature Granulocytes: 0.02 10*3/uL (ref 0.00–0.07)
Basophils Absolute: 0 10*3/uL (ref 0.0–0.1)
Basophils Relative: 1 %
Eosinophils Absolute: 0.1 10*3/uL (ref 0.0–0.5)
Eosinophils Relative: 2 %
HCT: 27.3 % — ABNORMAL LOW (ref 36.0–46.0)
Hemoglobin: 9.3 g/dL — ABNORMAL LOW (ref 12.0–15.0)
Immature Granulocytes: 1 %
Lymphocytes Relative: 20 %
Lymphs Abs: 0.7 10*3/uL (ref 0.7–4.0)
MCH: 36.5 pg — ABNORMAL HIGH (ref 26.0–34.0)
MCHC: 34.1 g/dL (ref 30.0–36.0)
MCV: 107.1 fL — ABNORMAL HIGH (ref 80.0–100.0)
Monocytes Absolute: 0.5 10*3/uL (ref 0.1–1.0)
Monocytes Relative: 13 %
Neutro Abs: 2.3 10*3/uL (ref 1.7–7.7)
Neutrophils Relative %: 63 %
Platelet Count: 240 10*3/uL (ref 150–400)
RBC: 2.55 MIL/uL — ABNORMAL LOW (ref 3.87–5.11)
RDW: 13.9 % (ref 11.5–15.5)
WBC Count: 3.6 10*3/uL — ABNORMAL LOW (ref 4.0–10.5)
nRBC: 0 % (ref 0.0–0.2)

## 2021-07-03 LAB — CMP (CANCER CENTER ONLY)
ALT: 15 U/L (ref 0–44)
AST: 22 U/L (ref 15–41)
Albumin: 4.3 g/dL (ref 3.5–5.0)
Alkaline Phosphatase: 50 U/L (ref 38–126)
Anion gap: 11 (ref 5–15)
BUN: 30 mg/dL — ABNORMAL HIGH (ref 8–23)
CO2: 22 mmol/L (ref 22–32)
Calcium: 9.8 mg/dL (ref 8.9–10.3)
Chloride: 106 mmol/L (ref 98–111)
Creatinine: 1.4 mg/dL — ABNORMAL HIGH (ref 0.44–1.00)
GFR, Estimated: 38 mL/min — ABNORMAL LOW (ref >60)
Glucose, Bld: 116 mg/dL — ABNORMAL HIGH (ref 70–99)
Potassium: 3.9 mmol/L (ref 3.5–5.1)
Sodium: 139 mmol/L (ref 135–145)
Total Bilirubin: 0.3 mg/dL (ref 0.3–1.2)
Total Protein: 7.7 g/dL (ref 6.5–8.1)

## 2021-07-03 LAB — MAGNESIUM: Magnesium: 1.1 mg/dL — ABNORMAL LOW (ref 1.7–2.4)

## 2021-07-03 MED ORDER — SODIUM CHLORIDE 0.9 % IV SOLN: INTRAVENOUS | Status: DC

## 2021-07-03 MED ORDER — FULVESTRANT 250 MG/5ML IM SOSY
500.0000 mg | PREFILLED_SYRINGE | Freq: Once | INTRAMUSCULAR | Status: AC
  Administered 2021-07-03: 500 mg via INTRAMUSCULAR
  Filled 2021-07-03: qty 10

## 2021-07-03 MED ORDER — MAGNESIUM SULFATE 2 GM/50ML IV SOLN
2.0000 g | Freq: Once | INTRAVENOUS | Status: AC
  Administered 2021-07-03: 2 g via INTRAVENOUS
  Filled 2021-07-03: qty 50

## 2021-07-03 NOTE — Progress Notes (Signed)
Orders for IV mag 2 g ordered per MD.

## 2021-07-03 NOTE — Progress Notes (Signed)
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   #2 status post 4 cycles of doxorubicin and cyclophosphamide,                          (i) followed by radiation therapy under the care of Dr. [name].   #3 received tamoxifen for a total of seven years    RIGHT BREAST #4  S/P biopsy of the RIGHT breast upper inner quadrant on 11/14/10 showing invasive ductal carcinoma,, grade 2, estrogen receptor 85% and progesterone receptor 57% positive, Ki67 20%, HER2 not amplified.     #5 started neoadjuvant letrozole in November 2012; switched to tamoxifen as of February 2016 due to osteopenia concerns   #6  S/P right lumpectomy with sentinel lymph node biopsy on 09/03/11 for a ypT2, ypN1a, stage IIB invasive lobular carcinoma, grade 2,estrogen receptor 97% positive, progesterone receptor 12% positive, with no HER-2 amplification.   #7 status post right breast radiation therapy under the care of Dr. [name] from 10/19/2011 to 12/03/2011.    #8 did not meet criteria for genetic testing according to her insurance company.   #9 osteopenia, with a T score of -1.8 on DEXA scan at [facility] 03/07/2013             (a) status post multiple dental extractions and implants             (b) repeat bone density at [facility] 04/02/2015 shows a T score of -2.0   METASTATIC DISEASE: April 2021 (lymph nodes, bone)   #10:  left upper extremity lymphedema led to chest CT scan 05/09/2019 showing a 5.1 cm left subpectoral chest wall mass and thoracic lymphadenopathy              (a) CT biopsy of the chest wall mass 05/25/2019 confirms recurrent breast cancer, strongly estrogen and  progesterone receptor positive, HER-2 not amplified             (b) PET scan 05/30/2019 shows a left subpectoral mass measuring 5.4 cm, with significant regional and mediastinal adenopathy, sclerotic left scapular metastasis, but no liver or lung involvement.             (c) CA 27-29 is moderately informative   #11 anastrozole started 06/02/2019; palbociclib added 06/08/2019             (a) palbociclib taken irregularly for several months secondary to cytopenias             (b) palbociclib dose decreased to 100 mg daily 21 on 7 off October 2021             (c) CT of the chest with contrast 11/09/2019 shows mild disease progression (despite a continuing drop in the CA 27-29)             (d) fulvestrant added 11/23/2019             (e) repeat chest CT with contrast 09/09/2020 read as stable.   #12 denosumab/Xgeva starting 06/08/2019, to be repeated every 3 months due to hypocalcemia             (a)  treatment held after August 2022 dose secondary to ongoing dental issues   #13 genetics testing 06/15/2019 through the [facility] Common Hereditary Cancers Panel found no deleterious mutations in APC, ATM, AXIN2, BARD1, BMPR1A, BRCA1, BRCA2, BRIP1, CDH1, CDKN2A (p14ARF), CDKN2A (p16INK4a), CKD4, CHEK2, CTNNA1, DICER1, EPCAM (Deletion/duplication testing only), GREM1 (promoter region deletion/duplication testing only), KIT, MEN1, MLH1, MSH2, MSH3, MSH6, MUTYH, NBN, NF1, NHTL1, PALB2, PDGFRA, PMS2, POLD1, POLE, PTEN, RAD50, RAD51C, RAD51D, RNF43, SDHB, SDHC, SDHD, SMAD4, SMARCA4. STK11, TP53, TSC1, TSC2, and VHL.  The following genes were evaluated for sequence changes only: SDHA and HOXB13 c.251G>A variant only.  CURRENT THERAPY: Anastrozole, Palbociclib, Fulvestrant  INTERVAL HISTORY:  Stacey [name] 81 y.o. female returns for follow-up follow-up of her metastatic breast cancer.  Her most recent restaging was completed on April 15, 2021 with a PET scan and showed no evidence of metastatic disease, quiescent  left scapular metastasis and no metabolism in the left brachial plexus region.  She continues on anastrozole daily. She tolerates it well except fatigue.  She is also taking palbociclib dosed at 100 mg 3 weeks on and 1 week off.  She is currently in her first week.  Additionally she receives fulvestrant every 4 weeks.  She tolerates this well with no issues.  No new bone pains. No change in breathing. Diarrhea may be a bit worse, she has noticed that sometimes she has diarrhea after dinner, had to take imodium. She also takes [place] once a day in the morning. She has lost another tooth and this bothers her more than anything. She is working with the dentist to have this worked on.  Rest of the pertinent 10 point ROS reviewed and negative   Patient Active Problem List   Diagnosis Date Noted   Acute upper respiratory infection 05/16/2021   Chronic sinusitis 11/25/2020   Allergic rhinitis 07/08/2020   Hypomagnesemia 06/10/2020   Goals of care, counseling/discussion 06/21/2019   Family history of breast cancer    Family history of pancreatic cancer    Family history of uterine cancer    Family history of lymphoma    Family history of lung cancer    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[place]) 06/02/2019   Recurrent cancer of left breast ([place]) 05/15/2019   Pes anserine bursitis 03/31/2019   HTN (hypertension) 02/10/2019   Hyperglycemia 06/21/2018   Left carpal tunnel syndrome 02/22/2018   Rotator cuff arthropathy of left shoulder 09/20/2017   Arthritis of hand 08/23/2017   Pain in right hand 08/23/2017   Cervical radiculitis 04/09/2017   Left shoulder pain 04/09/2017   Dyspnea on exertion 05/14/2016   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   Lymphedema of left arm 01/14/2016   Left arm swelling 12/25/2015   SVT (supraventricular tachycardia) (HCC)    Left-sided carotid artery disease ([place]) 03/08/2015   Dizziness 01/24/2015   Urinary frequency  01/13/2015   Dizziness and giddiness 01/09/2015   Gallstones 02/21/2013   Malignant neoplasm of upper-inner quadrant of left breast in female, estrogen receptor positive ([place]) 11/28/2012   Muscle cramping 09/12/2012   Right hip pain 09/12/2012   Lower back pain 09/12/2012   COPD GOLD I     Hx of radiation therapy    Right shoulder pain 09/14/2011   Preventative health care 07/29/2010   Skin lesion of left leg 07/29/2010   Paresthesia 07/29/2010   GERD 03/28/2010   CONSTIPATION 03/28/2010   Osteoarthrosis, 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  MEDICAL HISTORY: Past Medical History:  Diagnosis Date   ANEMIA-NOS    ANXIETY    Breast cancer ([place]) 1997 L, 2012 R   s/p chemo/xrt   COPD    resolved   DIVERTICULOSIS, COLON 2008   Dizziness    Family history of breast cancer    Family history of lung cancer    Family history of lymphoma    Family history of pancreatic cancer    Family history of uterine cancer    GERD    Hx of radiation therapy 10/19/11 -12/03/11   right breast   HYPERLIPIDEMIA    IRRITABLE BOWEL SYNDROME, HX OF    Left-sided carotid artery disease (HCC)    moderate left ICA stenosis   OSTEOARTHRITIS, HAND    PSVT (paroxysmal supraventricular tachycardia) ([place])    symptomatic on event monitor    SURGICAL HISTORY: Past Surgical History:  Procedure Laterality Date   ABDOMINAL HYSTERECTOMY     APPENDECTOMY     BREAST BIOPSY  11/14/10    r breast: inv, insitu mammary carcinoma w/calcif, er/pr +, her2 -   BREAST SURGERY     lumpectomy   CATARACT EXTRACTION     both eyes   ELECTROPHYSIOLOGIC STUDY N/A 05/10/2015   Procedure: SVT Ablation;  Surgeon: Will Meredith Leeds, MD;   Location: [facility];  Service: Cardiovascular;  Laterality: N/A;   HERNIA REPAIR     inguinal herniorrhapy  1984   left   IR US GUIDE BX ASP/DRAIN  05/26/2019   rectal fissure repair     s/p benign breast biopsy  2003   right   s/p left foot surgury  2009   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   spiral fx left foot  2008   no surgury   TMJ ARTHROPLASTY  1989   TONGUE SURGERY     1988- to remove scar tissue growth    TONSILLECTOMY      SOCIAL HISTORY: Social History   Socioeconomic History   Marital status: Divorced    Spouse name: 2 Step-children   Number of children: 2   Years of education: Not on file   Highest education level: Not on file  Occupational History   Occupation: retired Banker  Tobacco Use   Smoking status: Former    Packs/day: 1.00    Years: 54.00    Pack years: 54.00    Types: Cigarettes    Quit date: 01/03/2016    Years since quitting: 5.5   Smokeless tobacco: Never  Vaping Use   Vaping Use: Never used  Substance and Sexual Activity   Alcohol use: Yes    Comment: rare/ drinks socially   Drug use: No   Sexual activity: Never  Other Topics Concern   Not on file  Social History Narrative   Patient gets no regular exercise   No biological children   2 step children   Social Determinants of Health   Financial Resource Strain: Low Risk    Difficulty of Paying Living Expenses: Not hard at all  Food Insecurity: No Food Insecurity   Worried About Charity fundraiser in the Last Year: Never true   [place] in the Last Year:  Never true  Transportation Needs: No Transportation Needs   Lack of Transportation (Medical): No   Lack of Transportation (Non-Medical): No  Physical Activity: Sufficiently Active   Days of Exercise per Week: 5 days   Minutes of Exercise per Session: 30 min  Stress: No Stress Concern Present   Feeling of Stress : Not at all  Social Connections: Moderately Integrated   Frequency of Communication with  Friends and Family: More than three times a week   Frequency of Social Gatherings with Friends and Family: More than three times a week   Attends Religious Services: More than 4 times per year   Active Member of Clubs or Organizations: No   Attends Music therapist: More than 4 times per year   Marital Status: Widowed  Human resources officer Violence: Not At Risk   Fear of Current or Ex-Partner: No   Emotionally Abused: No   Physically Abused: No   Sexually Abused: No    FAMILY HISTORY: Family History  Problem Relation Age of Onset   Hypertension Mother    Stroke Mother    Colon polyps Mother    Diabetes Mother    Pancreatic cancer Mother 36   Uterine cancer Mother 2   Lymphoma Brother        burkitts   Lung cancer Paternal Uncle    Lung cancer Maternal Grandmother 82       non-smoker   Lung cancer Maternal Grandfather    Breast cancer Cousin        maternal cousin, dx in her mid 52s   Brain cancer Cousin        maternal cousin's son; dx in his 54s   Testicular cancer Cousin        maternal cousin's son;    Breast cancer Cousin        paternal cousin; dx in her 54s   Breast cancer Cousin        paternal cousin's daughter; dx in 43s; neg genetic testing   Colon cancer Neg Hx    Esophageal cancer Neg Hx    Stomach cancer Neg Hx    Rectal cancer Neg Hx     Review of Systems  Constitutional:  Positive for fatigue. Negative for appetite change, chills, fever and unexpected weight change.  HENT:   Negative for hearing loss, lump/mass and trouble swallowing.   Eyes:  Negative for eye problems and icterus.  Respiratory:  Negative for chest tightness, cough and shortness of breath.   Cardiovascular:  Negative for chest pain, leg swelling and palpitations.  Gastrointestinal:  Positive for diarrhea. Negative for abdominal distention, abdominal pain, constipation, nausea and vomiting.  Endocrine: Negative for hot flashes.  Genitourinary:  Negative for difficulty  urinating.   Musculoskeletal:  Negative for arthralgias.  Skin:  Negative for itching and rash.  Neurological:  Negative for dizziness, extremity weakness, headaches and numbness.  Hematological:  Negative for adenopathy. Does not bruise/bleed easily.  Psychiatric/Behavioral:  Negative for depression. The patient is not nervous/anxious.      PHYSICAL EXAMINATION  ECOG PERFORMANCE STATUS: 1 - Symptomatic but completely ambulatory  Vitals:   07/03/21 1440  BP: (!) 120/54  Pulse: 88  Temp: 97.9 F (36.6 C)  SpO2: 98%    Physical Exam Constitutional:      General: She is not in acute distress.    Appearance: Normal appearance. She is not toxic-appearing.  HENT:     Head: Normocephalic and atraumatic.  Eyes:  General: No scleral icterus. Cardiovascular:     Rate and Rhythm: Normal rate and regular rhythm.     Pulses: Normal pulses.     Heart sounds: Normal heart sounds.  Pulmonary:     Effort: Pulmonary effort is normal.     Breath sounds: Normal breath sounds.  Abdominal:     General: Abdomen is flat. Bowel sounds are normal. There is no distension.     Palpations: Abdomen is soft.     Tenderness: There is no abdominal tenderness.  Musculoskeletal:        General: Swelling (In the left arm) present.     Cervical back: Neck supple.  Lymphadenopathy:     Cervical: No cervical adenopathy.  Skin:    General: Skin is warm and dry.     Findings: No rash.  Neurological:     General: No focal deficit present.     Mental Status: She is alert.  Psychiatric:        Mood and Affect: Mood normal.        Behavior: Behavior normal.    LABORATORY DATA: Appointment on 07/03/2021  Component Date Value Ref Range Status   Magnesium 07/03/2021 1.1 (L)  1.7 - 2.4 mg/dL Final   Performed at [facility], [address], [place], [place] [address]   Sodium 07/03/2021 139  135 - 145 mmol/L Final   Potassium 07/03/2021 3.9  3.5 - 5.1 mmol/L Final    Chloride 07/03/2021 106  98 - 111 mmol/L Final   CO2 07/03/2021 22  22 - 32 mmol/L Final   Glucose, Bld 07/03/2021 116 (H)  70 - 99 mg/dL Final   Glucose reference range applies only to samples taken after fasting for at least 8 hours.   BUN 07/03/2021 30 (H)  8 - 23 mg/dL Final   Creatinine 07/03/2021 1.40 (H)  0.44 - 1.00 mg/dL Final   Calcium 07/03/2021 9.8  8.9 - 10.3 mg/dL Final   Total Protein 07/03/2021 7.7  6.5 - 8.1 g/dL Final   Albumin 07/03/2021 4.3  3.5 - 5.0 g/dL Final   AST 07/03/2021 22  15 - 41 U/L Final   ALT 07/03/2021 15  0 - 44 U/L Final   Alkaline Phosphatase 07/03/2021 50  38 - 126 U/L Final   Total Bilirubin 07/03/2021 0.3  0.3 - 1.2 mg/dL Final   GFR, Estimated 07/03/2021 38 (L)  >60 mL/min Final   Comment: (NOTE) Calculated using the CKD-EPI Creatinine Equation (2021)    Anion gap 07/03/2021 11  5 - 15 Final   Performed at [facility], [place] [address] [place], [place] [address]   WBC Count 07/03/2021 3.6 (L)  4.0 - 10.5 K/uL Final   RBC 07/03/2021 2.55 (L)  3.87 - 5.11 MIL/uL Final   Hemoglobin 07/03/2021 9.3 (L)  12.0 - 15.0 g/dL Final   HCT 07/03/2021 27.3 (L)  36.0 - 46.0 % Final   MCV 07/03/2021 107.1 (H)  80.0 - 100.0 fL Final   MCH 07/03/2021 36.5 (H)  26.0 - 34.0 pg Final   MCHC 07/03/2021 34.1  30.0 - 36.0 g/dL Final   RDW 07/03/2021 13.9  11.5 - 15.5 % Final   Platelet Count 07/03/2021 240  150 - 400 K/uL Final   nRBC 07/03/2021 0.0  0.0 - 0.2 % Final   Neutrophils Relative % 07/03/2021 63  % Final   Neutro Abs 07/03/2021 2.3  1.7 - 7.7 K/uL Final   Lymphocytes  Relative 07/03/2021 20  % Final   Lymphs Abs 07/03/2021 0.7  0.7 - 4.0 K/uL Final   Monocytes Relative 07/03/2021 13  % Final   Monocytes Absolute 07/03/2021 0.5  0.1 - 1.0 K/uL Final   Eosinophils Relative 07/03/2021 2  % Final   Eosinophils Absolute 07/03/2021 0.1  0.0 - 0.5 K/uL Final   Basophils Relative 07/03/2021 1  % Final   Basophils Absolute 07/03/2021  0.0  0.0 - 0.1 K/uL Final   Immature Granulocytes 07/03/2021 1  % Final   Abs Immature Granulocytes 07/03/2021 0.02  0.00 - 0.07 K/uL Final   Performed at [facility], [place] [address], [place], [place] [address]     ASSESSMENT and THERAPY PLAN:   This is a very pleasant 81 year old female patient with metastatic breast cancer currently on palbociclib, and anastrozole as well as fulvestrant since about May 2021 who is here for follow-up.  She had dental issues secondary to [facility] and has lost several implants and is unfortunately quite upset by that.   She however continues to respond very well to the current regimen.  Last pet imaging with no evidence of recurrent or metastatic disease, question left scapular metastasis.  She tolerates current regimen well except for some diarrhea.  She is on her week 1 of Ibrance.  She also continues to have severe hypomagnesemia hence we have recommended IV magnesium supplementation.   She takes oral magnesium about 750 mg daily but does not want to uptitrate this given diarrhea from magnesium. We will continue current regimen, she will return to clinic in 4 weeks for labs and Faslodex and return to clinic with MD in about 8 weeks. She was encouraged to call us with any new questions or concerns  All questions were answered. The patient knows to call the clinic with any problems, questions or concerns. We can certainly see the patient much sooner if necessary.  Total encounter time:30 minutes*in face-to-face visit time, chart review, lab review, care coordination, order entry, and documentation of the encounter time.   *Total Encounter Time as defined by the Centers for Medicare and Medicaid Services includes, in addition to the face-to-face time of a patient visit (documented in the note above) non-face-to-face time: obtaining and reviewing outside history, ordering and reviewing medications, tests or procedures, care coordination  (communications with other health care professionals or caregivers) and documentation in the medical record.

## 2021-07-04 ENCOUNTER — Other Ambulatory Visit: Payer: Self-pay | Admitting: *Deleted

## 2021-07-04 DIAGNOSIS — C50212 Malignant neoplasm of upper-inner quadrant of left female breast: Secondary | ICD-10-CM

## 2021-07-04 LAB — CANCER ANTIGEN 27.29: CA 27.29: 47 U/mL — ABNORMAL HIGH (ref 0.0–38.6)

## 2021-07-04 MED ORDER — PALBOCICLIB 100 MG PO TABS: ORAL_TABLET | ORAL | 6 refills | Status: DC

## 2021-07-11 ENCOUNTER — Encounter: Payer: Self-pay | Admitting: Internal Medicine

## 2021-07-11 ENCOUNTER — Ambulatory Visit (INDEPENDENT_AMBULATORY_CARE_PROVIDER_SITE_OTHER): Payer: Medicare Other | Admitting: Internal Medicine

## 2021-07-11 VITALS — BP 122/60 | HR 99 | Temp 98.1°F | Ht 63.0 in | Wt 139.8 lb

## 2021-07-11 DIAGNOSIS — I10 Essential (primary) hypertension: Secondary | ICD-10-CM

## 2021-07-11 DIAGNOSIS — R739 Hyperglycemia, unspecified: Secondary | ICD-10-CM

## 2021-07-11 DIAGNOSIS — J449 Chronic obstructive pulmonary disease, unspecified: Secondary | ICD-10-CM

## 2021-07-11 DIAGNOSIS — E781 Pure hyperglyceridemia: Secondary | ICD-10-CM

## 2021-07-11 MED ORDER — TELMISARTAN 40 MG PO TABS: 40.0000 mg | ORAL_TABLET | Freq: Every morning | ORAL | 3 refills | Status: DC

## 2021-07-11 NOTE — Progress Notes (Unsigned)
Patient ID: Stacey [name], female   DOB: 1940-05-10, 81 y.o.   MRN: [id number]         Chief Complaint:: yearly exam and HTN       HPI:  Stacey [name] is a 81 y.o. female here overall doing well.  Pt denies chest pain, increased sob or doe, wheezing, orthopnea, PND, increased LE swelling, palpitations, dizziness or syncope.   Pt denies polydipsia, polyuria, or new focal neuro s/s.    Pt denies fever, wt loss, night sweats, loss of appetite, or other constitutional symptoms  Losing up to 10 teeth implants due to chemo.  Declines covid booster and shingrix   Wt Readings from Last 3 Encounters:  07/11/21 139 lb 12.8 oz (63.4 kg)  07/03/21 141 lb 4.8 oz (64.1 kg)  06/16/21 138 lb (62.6 kg)   BP Readings from Last 3 Encounters:  07/11/21 122/60  07/03/21 (!) 120/54  06/16/21 (!) 165/69   Immunization History  Administered Date(s) Administered   Fluad Quad(high Dose 65+) 11/03/2018   Influenza Split 12/17/2011   Influenza Whole 12/17/2006, 10/23/2008, 10/18/2010   Influenza, High Dose Seasonal PF 11/16/2013, 11/26/2017   Influenza,inj,Quad PF,6+ Mos 11/19/2020   Influenza-Unspecified 11/02/2012, 11/17/2014, 11/05/2015, 11/03/2019   PFIZER(Purple Top)SARS-COV-2 Vaccination 02/27/2019, 03/27/2019, 09/20/2019, 08/15/2020, 11/19/2020   Pneumococcal Conjugate-13 12/15/2013   Pneumococcal Polysaccharide-23 01/20/2006   Td 06/14/2008   Tdap 02/10/2019   There are no preventive care reminders to display for this patient.     Past Medical History:  Diagnosis Date   ANEMIA-NOS    ANXIETY    Breast cancer ([place]) 1997 L, 2012 R   s/p chemo/xrt   COPD    resolved   DIVERTICULOSIS, COLON 2008   Dizziness    Family history of breast cancer    Family history of lung cancer    Family history of lymphoma    Family history of pancreatic cancer    Family history of uterine cancer    GERD    Hx of radiation therapy 10/19/11 -12/03/11   right breast   HYPERLIPIDEMIA    IRRITABLE BOWEL SYNDROME,  HX OF    Left-sided carotid artery disease (HCC)    moderate left ICA stenosis   OSTEOARTHRITIS, HAND    PSVT (paroxysmal supraventricular tachycardia) ([place])    symptomatic on event monitor   Past Surgical History:  Procedure Laterality Date   ABDOMINAL HYSTERECTOMY     APPENDECTOMY     BREAST BIOPSY  11/14/10    r breast: inv, insitu mammary carcinoma w/calcif, er/pr +, her2 -   BREAST SURGERY     lumpectomy   CATARACT EXTRACTION     both eyes   ELECTROPHYSIOLOGIC STUDY N/A 05/10/2015   Procedure: SVT Ablation;  Surgeon: Will Meredith Leeds, MD;  Location: [facility];  Service: Cardiovascular;  Laterality: N/A;   HERNIA REPAIR     inguinal herniorrhapy  1984   left   IR US GUIDE BX ASP/DRAIN  05/26/2019   rectal fissure repair     s/p benign breast biopsy  2003   right   s/p left foot surgury  2009   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  Her smoking use included cigarettes. She has a 54.00 pack-year smoking history. She has never used smokeless tobacco. She reports current alcohol use. She reports that she does not use drugs. family history includes Brain cancer in her cousin; Breast cancer in her cousin, cousin, and cousin; Colon polyps in her mother; Diabetes in her mother; Hypertension in her mother; Lung cancer in her maternal grandfather and paternal uncle; Lung cancer (age of onset: 56) in her maternal grandmother; Lymphoma in her brother; Pancreatic cancer (age of onset: 85) in her mother; Stroke in her mother; Testicular cancer in her cousin; Uterine cancer (age of onset: 24) in her mother. Allergies  Allergen Reactions   Aminoglycosides Other (See Comments)    Unknown reaction - pt is not sure where this entry came from   Bee Venom Swelling    Severe body swelling    Clindamycin/Lincomycin Diarrhea and Nausea And Vomiting   Codeine Nausea And Vomiting    Pt can take codeine cough syrup.   Current Outpatient Medications on File Prior to Visit  Medication Sig Dispense Refill   acetaminophen (TYLENOL) 500 MG tablet Take 1,000 mg by mouth See admin instructions. Take 2 tablets (1000 mg) by mouth daily at bedtime, may also take 2 tablets (1000 mg) two more times during the day or night as needed for pain     anastrozole (ARIMIDEX) 1 MG tablet TAKE 1 TABLET(1 MG) BY MOUTH DAILY (Patient taking differently: Take 1 mg by mouth every morning.) 90 tablet 4   aspirin 81 MG EC tablet Take 81 mg by mouth every morning.     CALCIUM CARBONATE-VITAMIN D PO Take 1 tablet by mouth 2 (two) times daily.     Cholecalciferol (VITAMIN D3 PO) Take 1 capsule by mouth every morning.     cholestyramine (QUESTRAN) 4 g packet MIX AND DRINK 1 PACKET(4 GRAMS) BY MOUTH THREE TIMES DAILY (Patient taking differently: Take 4 g by mouth every morning.) 60 each 12   clotrimazole-betamethasone (LOTRISONE) cream Apply 1 application topically 2 (two) times daily as needed. (Patient taking differently: Apply 1 application  topically 2 (two) times daily as needed (rash/yeast in corners of mouth).) 30 g 1   diphenhydrAMINE (BENADRYL) 25 MG tablet Take 25 mg by mouth every 6 (six) hours as needed (for bee stings).      fulvestrant (FASLODEX) 250 MG/5ML injection Inject 250 mg into the muscle every 28 (twenty-eight) days.     loperamide (IMODIUM) 2 MG capsule 2-4 mg See admin instructions. Take 2 tablets (4 mg) by mouth for first loose stool, then 1 tablet (2 mg) for every loose stool thereafter. Do not exceed 8 tablets (16 mg) in a 24 hour period. Stop loperamide if 12 hours have passed without a loose stool. 30 capsule    loratadine (CLARITIN) 10 MG tablet Take 10 mg by mouth every morning.     Magnesium 500 MG CAPS Take 500 mg by mouth every morning. Take with a 250 mg capsule for a total dose of 750  mg daily     MAGNESIUM PO Take 250 mg by mouth every morning. Take with a 500 mg capsule for a total dose of 750 mg daily     Multiple Vitamin (MULTIVITAMIN WITH MINERALS) TABS tablet Take 1 tablet by mouth every morning. Centrum     omeprazole (PRILOSEC) 20 MG capsule Take 1 capsule by mouth daily (Patient taking differently: 20 mg every morning.) 90 capsule 2   palbociclib (IBRANCE) 100 MG tablet Take 1  tablet (100 mg total) by mouth Monday through Friday for 3 weeks, then one week off, then repeat 15 tablet 6   prochlorperazine (COMPAZINE) 10 MG tablet Take 1 tablet (10 mg total) by mouth every 6 (six) hours as needed for nausea or vomiting. 30 tablet 1   SPIRIVA RESPIMAT 2.5 MCG/ACT AERS INHALE 2 PUFFS INTO THE LUNGS DAILY (Patient taking differently: Inhale 2 puffs into the lungs every morning.) 4 g 11   triamcinolone (NASACORT) 55 MCG/ACT AERO nasal inhaler Place 2 sprays into the nose daily. 2 sprays each nostril at night before bedtime (Patient taking differently: Place 2 sprays into the nose See admin instructions. Instill 2 sprays into each nostril every night before bedtime) 1 each 12   triamterene-hydrochlorothiazide (DYAZIDE) 37.5-25 MG capsule TAKE 1 CAPSULE BY MOUTH EVERY MORNING (Patient taking differently: Take 1 capsule by mouth every morning.) 90 capsule 4   zolpidem (AMBIEN) 10 MG tablet TAKE 1 TABLET(10 MG) BY MOUTH AT BEDTIME AS NEEDED (Patient taking differently: Take 5 mg by mouth at bedtime.) 90 tablet 1   No current facility-administered medications on file prior to visit.        ROS:  All others reviewed and negative.  Objective        PE:  BP 122/60 (BP Location: Right Arm, Patient Position: Sitting, Cuff Size: Normal)   Pulse 99   Temp 98.1 F (36.7 C) (Oral)   Ht '5\' 3"'  (1.6 m)   Wt 139 lb 12.8 oz (63.4 kg)   SpO2 99%   BMI 24.76 kg/m                 Constitutional: Pt appears in NAD               HENT: Head: NCAT.                Right Ear: External ear  normal.                 Left Ear: External ear normal.                Eyes: . Pupils are equal, round, and reactive to light. Conjunctivae and EOM are normal               Nose: without d/c or deformity               Neck: Neck supple. Gross normal ROM               Cardiovascular: Normal rate and regular rhythm.                 Pulmonary/Chest: Effort normal and breath sounds without rales or wheezing.                Abd:  Soft, NT, ND, + BS, no organomegaly               Neurological: Pt is alert. At baseline orientation, motor grossly intact               Skin: Skin is warm. No rashes, no other new lesions, LE edema - none               Psychiatric: Pt behavior is normal without agitation   Micro: none  Cardiac tracings I have personally interpreted today:  none  Pertinent Radiological findings (summarize): none   Lab Results  Component Value Date   WBC 3.6 (L) 07/03/2021   HGB 9.3 (L) 07/03/2021  HCT 27.3 (L) 07/03/2021   PLT 240 07/03/2021   GLUCOSE 116 (H) 07/03/2021   CHOL 193 07/13/2018   TRIG 231.0 (H) 07/13/2018   HDL 49.40 07/13/2018   LDLDIRECT 119.0 07/13/2018   LDLCALC 84 07/04/2009   ALT 15 07/03/2021   AST 22 07/03/2021   NA 139 07/03/2021   K 3.9 07/03/2021   CL 106 07/03/2021   CREATININE 1.40 (H) 07/03/2021   BUN 30 (H) 07/03/2021   CO2 22 07/03/2021   TSH 3.41 07/13/2018   INR 1.1 05/26/2019   HGBA1C 6.6 (H) 07/13/2018   Assessment/Plan:  Stacey [name] is a 81 y.o. White or Caucasian [1] female with  has a past medical history of ANEMIA-NOS, ANXIETY, Breast cancer ([place]) (1997 L, 2012 R), COPD, DIVERTICULOSIS, COLON (2008), Dizziness, Family history of breast cancer, Family history of lung cancer, Family history of lymphoma, Family history of pancreatic cancer, Family history of uterine cancer, GERD, radiation therapy (10/19/11 -12/03/11), HYPERLIPIDEMIA, IRRITABLE BOWEL SYNDROME, HX OF, Left-sided carotid artery disease ([place]), OSTEOARTHRITIS, HAND, and  PSVT (paroxysmal supraventricular tachycardia) ([place]).  COPD GOLD I  Stable, cont spiriva and albuterol hfa prn,  to f/u any worsening symptoms or concerns  Hyperlipidemia Lab Results  Component Value Date   LDLCALC 84 07/04/2009   Stable, pt to continue current low chol diet   Hyperglycemia Lab Results  Component Value Date   HGBA1C 6.6 (H) 07/13/2018   Mild elevated A1c, pt to continue current diet, wt control, exercise, and f/u lab   HTN (hypertension) BP Readings from Last 3 Encounters:  07/11/21 122/60  07/03/21 (!) 120/54  06/16/21 (!) 165/69   Stable, pt to continue medical treatment micardis 40 mg qd  Followup: Return in about 1 year (around 07/12/2022).  Cathlean Cower, MD 07/13/2021 11:17 AM [facility]

## 2021-07-13 ENCOUNTER — Encounter: Payer: Self-pay | Admitting: Internal Medicine

## 2021-07-13 NOTE — Assessment & Plan Note (Signed)
Lab Results  Component Value Date   HGBA1C 6.6 (H) 07/13/2018   Mild elevated A1c, pt to continue current diet, wt control, exercise, and f/u lab

## 2021-07-13 NOTE — Patient Instructions (Signed)
Please continue all other medications as before, and refills have been done if requested.  Please have the pharmacy call with any other refills you may need.  Please continue your efforts at being more active, low cholesterol diet, and weight control.  You are otherwise up to date with prevention measures today.  Please keep your appointments with your specialists as you may have planned   

## 2021-07-13 NOTE — Assessment & Plan Note (Signed)
BP Readings from Last 3 Encounters:  07/11/21 122/60  07/03/21 (!) 120/54  06/16/21 (!) 165/69   Stable, pt to continue medical treatment micardis 40 mg qd

## 2021-07-13 NOTE — Assessment & Plan Note (Signed)
Stable, cont spiriva and albuterol hfa prn,  to f/u any worsening symptoms or concerns

## 2021-07-13 NOTE — Assessment & Plan Note (Signed)
Lab Results  Component Value Date   LDLCALC 84 07/04/2009   Stable, pt to continue current low chol diet

## 2021-07-16 ENCOUNTER — Telehealth: Payer: Self-pay

## 2021-07-16 DIAGNOSIS — C50212 Malignant neoplasm of upper-inner quadrant of left female breast: Secondary | ICD-10-CM

## 2021-07-16 MED ORDER — PALBOCICLIB 100 MG PO TABS: ORAL_TABLET | ORAL | 6 refills | Status: DC

## 2021-07-16 NOTE — Telephone Encounter (Signed)
Spoke with patient regarding Ibrance prescription. Patient is aware that the prescription has been sent to the appropriate pharmacy and will be sent through [place]. Patient will call when she receives delivery to coordinate on and off weeks. Patient verbalized an understanding and will call us as soon as she receives the medication.

## 2021-07-16 NOTE — Telephone Encounter (Signed)
Oral Oncology Pharmacist Encounter  Received staff message from RN that patient has not received their Ibrance.  Prescription refill for Ibrance sent to [facility] in error and was sent as print instead of escribed. Patient enrolled in manufacturer assistance and receives medication through [place] together patient assistance program. Prescription redirected to [facility].  Drema Halon, PharmD Hematology/Oncology Clinical Pharmacist [facility] [phone]

## 2021-07-22 ENCOUNTER — Ambulatory Visit (INDEPENDENT_AMBULATORY_CARE_PROVIDER_SITE_OTHER): Payer: Medicare Other | Admitting: *Deleted

## 2021-07-22 DIAGNOSIS — I10 Essential (primary) hypertension: Secondary | ICD-10-CM

## 2021-07-22 DIAGNOSIS — C50212 Malignant neoplasm of upper-inner quadrant of left female breast: Secondary | ICD-10-CM

## 2021-07-22 DIAGNOSIS — Z170 Estrogen receptor positive status [ER+]: Secondary | ICD-10-CM

## 2021-07-23 ENCOUNTER — Telehealth: Payer: Self-pay | Admitting: *Deleted

## 2021-07-23 NOTE — Patient Instructions (Signed)
Visit Information  Stacey [name], thank you for taking time to talk with me today. Please don't hesitate to contact me if I can be of assistance to you before our next scheduled telephone appointment  Below are the goals we discussed today:  Patient Self-Care Activities: Patient Stacey [name] will: Take medications as prescribed Attend all scheduled provider appointments Call pharmacy for medication refills Call provider office for new concerns or questions Continue to follow heart healthy, low salt diet Keep up the great work preventing falls Stay as active as possible around weather conditions  Our next scheduled telephone follow up visit/ appointment is scheduled on: Tuesday, January 13, 2022 at 3:00 pm- This is a PHONE [place] appointment  If you need to cancel or re-schedule our visit, please call [phone] and our care guide team will be happy to assist you.   I look forward to hearing about your progress.   [name], RN, BSN, [place] [phone]: direct office  If you are experiencing a Mental Health or [place] or need someone to talk to, please  call the Suicide and Crisis Lifeline: 988 call the [place] National Suicide Prevention Lifeline: 410-227-3671 or TTY: 904 181 7980 TTY 989-009-8454) to talk to a trained counselor call 1-800-273-TALK (toll free, 24 hour hotline) go to [facility] [address], [place] [phone]) call 911   Patient verbalizes understanding of instructions and care plan provided today and agrees to view in [place]. Active MyChart status and patient understanding of how to access instructions and care plan via MyChart confirmed with patient     Managing Your Hypertension Hypertension, also called high blood pressure, is when the force of the blood pressing against the walls of the arteries is too strong. Arteries are blood vessels that  carry blood from your heart throughout your body. Hypertension forces the heart to work harder to pump blood and may cause the arteries to become narrow or stiff. Understanding blood pressure readings A blood pressure reading includes a higher number over a lower number: The first, or top, number is called the systolic pressure. It is a measure of the pressure in your arteries as your heart beats. The second, or bottom number, is called the diastolic pressure. It is a measure of the pressure in your arteries as the heart relaxes. For most people, a normal blood pressure is below 120/80. Your personal target blood pressure may vary depending on your medical conditions, your age, and other factors. Blood pressure is classified into four stages. Based on your blood pressure reading, your health care provider may use the following stages to determine what type of treatment you need, if any. Systolic pressure and diastolic pressure are measured in a unit called millimeters of mercury (mmHg). Normal Systolic pressure: below 283. Diastolic pressure: below 80. Elevated Systolic pressure: 151-761. Diastolic pressure: below 80. Hypertension stage 1 Systolic pressure: 607-371. Diastolic pressure: 06-26. Hypertension stage 2 Systolic pressure: 948 or above. Diastolic pressure: 90 or above. How can this condition affect me? Managing your hypertension is very important. Over time, hypertension can damage the arteries and decrease blood flow to parts of the body, including the brain, heart, and kidneys. Having untreated or uncontrolled hypertension can lead to: A heart attack. A stroke. A weakened blood vessel (aneurysm). Heart failure. Kidney damage. Eye damage. Memory and concentration problems. Vascular dementia. What actions can I take to manage this condition? Hypertension can be managed by making lifestyle  changes and possibly by taking medicines. Your health care provider will help you make a  plan to bring your blood pressure within a normal range. You may be referred for counseling on a healthy diet and physical activity. Nutrition  Eat a diet that is high in fiber and potassium, and low in salt (sodium), added sugar, and fat. An example eating plan is called the DASH diet. DASH stands for Dietary Approaches to Stop Hypertension. To eat this way: Eat plenty of fresh fruits and vegetables. Try to fill one-half of your plate at each meal with fruits and vegetables. Eat whole grains, such as whole-wheat pasta, brown rice, or whole-grain bread. Fill about one-fourth of your plate with whole grains. Eat low-fat dairy products. Avoid fatty cuts of meat, processed or cured meats, and poultry with skin. Fill about one-fourth of your plate with lean proteins such as fish, chicken without skin, beans, eggs, and tofu. Avoid pre-made and processed foods. These tend to be higher in sodium, added sugar, and fat. Reduce your daily sodium intake. Many people with hypertension should eat less than 1,500 mg of sodium a day. Lifestyle  Work with your health care provider to maintain a healthy body weight or to lose weight. Ask what an ideal weight is for you. Get at least 30 minutes of exercise that causes your heart to beat faster (aerobic exercise) most days of the week. Activities may include walking, swimming, or biking. Include exercise to strengthen your muscles (resistance exercise), such as weight lifting, as part of your weekly exercise routine. Try to do these types of exercises for 30 minutes at least 3 days a week. Do not use any products that contain nicotine or tobacco. These products include cigarettes, chewing tobacco, and vaping devices, such as e-cigarettes. If you need help quitting, ask your health care provider. Control any long-term (chronic) conditions you have, such as high cholesterol or diabetes. Identify your sources of stress and find ways to manage stress. This may include  meditation, deep breathing, or making time for fun activities. Alcohol use Do not drink alcohol if: Your health care provider tells you not to drink. You are pregnant, may be pregnant, or are planning to become pregnant. If you drink alcohol: Limit how much you have to: 0-1 drink a day for women. 0-2 drinks a day for men. Know how much alcohol is in your drink. In the [place] one drink equals one 12 oz bottle of beer (355 mL), one 5 oz glass of wine (148 mL), or one 1 oz glass of hard liquor (44 mL). Medicines Your health care provider may prescribe medicine if lifestyle changes are not enough to get your blood pressure under control and if: Your systolic blood pressure is 130 or higher. Your diastolic blood pressure is 80 or higher. Take medicines only as told by your health care provider. Follow the directions carefully. Blood pressure medicines must be taken as told by your health care provider. The medicine does not work as well when you skip doses. Skipping doses also puts you at risk for problems. Monitoring Before you monitor your blood pressure: Do not smoke, drink caffeinated beverages, or exercise within 30 minutes before taking a measurement. Use the bathroom and empty your bladder (urinate). Sit quietly for at least 5 minutes before taking measurements. Monitor your blood pressure at home as told by your health care provider. To do this: Sit with your back straight and supported. Place your feet flat on the floor. Do not  cross your legs. Support your arm on a flat surface, such as a table. Make sure your upper arm is at heart level. Each time you measure, take two or three readings one minute apart and record the results. You may also need to have your blood pressure checked regularly by your health care provider. General information Talk with your health care provider about your diet, exercise habits, and other lifestyle factors that may be contributing to hypertension. Review  all the medicines you take with your health care provider because there may be side effects or interactions. Keep all follow-up visits. Your health care provider can help you create and adjust your plan for managing your high blood pressure. Where to find more information National Heart, Lung, and Blood Institute: https://wilson-eaton.com/ American Heart Association: www.heart.org Contact a health care provider if: You think you are having a reaction to medicines you have taken. You have repeated (recurrent) headaches. You feel dizzy. You have swelling in your ankles. You have trouble with your vision. Get help right away if: You develop a severe headache or confusion. You have unusual weakness or numbness, or you feel faint. You have severe pain in your chest or abdomen. You vomit repeatedly. You have trouble breathing. These symptoms may be an emergency. Get help right away. Call 911. Do not wait to see if the symptoms will go away. Do not drive yourself to the hospital. Summary Hypertension is when the force of blood pumping through your arteries is too strong. If this condition is not controlled, it may put you at risk for serious complications. Your personal target blood pressure may vary depending on your medical conditions, your age, and other factors. For most people, a normal blood pressure is less than 120/80. Hypertension is managed by lifestyle changes, medicines, or both. Lifestyle changes to help manage hypertension include losing weight, eating a healthy, low-sodium diet, exercising more, stopping smoking, and limiting alcohol. This information is not intended to replace advice given to you by your health care provider. Make sure you discuss any questions you have with your health care provider. Document Revised: 10/03/2020 Document Reviewed: 10/03/2020 Elsevier Patient Education  [employer] [place].

## 2021-07-23 NOTE — Chronic Care Management (AMB) (Signed)
Chronic Care Management   CCM RN Visit Note  07/23/2021 Name: Stacey [name] MRN: [id number] DOB: 04/11/1940  Subjective: Stacey [name] is a 81 y.o. year old female who is a primary care Stacey of Biagio Borg, MD. The care management team was consulted for assistance with disease management and care coordination needs.    Engaged with Stacey by telephone for follow up visit in response to provider referral for case management and/or care coordination services.   Consent to Services:  The Stacey was given information about Chronic Care Management services, agreed to services, and gave verbal consent prior to initiation of services.  Please see initial visit note for detailed documentation.  Stacey agreed to services and verbal consent obtained.   Assessment: Review of Stacey past medical history, allergies, medications, health status, including review of consultants reports, laboratory and other test data, was performed as part of comprehensive evaluation and provision of chronic care management services.   SDOH (Social Determinants of Health) assessments and interventions performed:  SDOH Interventions    Flowsheet Row Most Recent Value  SDOH Interventions   Food Insecurity Interventions Intervention Not Indicated  [continues to deny food insecurity]  Housing Interventions Intervention Not Indicated  [continues to reside alone,  denies housing concerns]  Transportation Interventions Intervention Not Indicated  [continues to drive self]     CCM Care Plan  Allergies  Allergen Reactions   Aminoglycosides Other (See Comments)    Unknown reaction - pt is not sure where this entry came from   Bee Venom Swelling    Severe body swelling   Clindamycin/Lincomycin Diarrhea and Nausea And Vomiting   Codeine Nausea And Vomiting    Pt can take codeine cough syrup.   Outpatient Encounter Medications as of 07/22/2021  Medication Sig Note   acetaminophen (TYLENOL) 500 MG tablet Take 1,000  mg by mouth See admin instructions. Take 2 tablets (1000 mg) by mouth daily at bedtime, may also take 2 tablets (1000 mg) two more times during the day or night as needed for pain    anastrozole (ARIMIDEX) 1 MG tablet TAKE 1 TABLET(1 MG) BY MOUTH DAILY (Stacey taking differently: Take 1 mg by mouth every morning.)    aspirin 81 MG EC tablet Take 81 mg by mouth every morning.    CALCIUM CARBONATE-VITAMIN D PO Take 1 tablet by mouth 2 (two) times daily.    Cholecalciferol (VITAMIN D3 PO) Take 1 capsule by mouth every morning.    cholestyramine (QUESTRAN) 4 g packet MIX AND DRINK 1 PACKET(4 GRAMS) BY MOUTH THREE TIMES DAILY (Stacey taking differently: Take 4 g by mouth every morning.)    clotrimazole-betamethasone (LOTRISONE) cream Apply 1 application topically 2 (two) times daily as needed. (Stacey taking differently: Apply 1 application  topically 2 (two) times daily as needed (rash/yeast in corners of mouth).)    diphenhydrAMINE (BENADRYL) 25 MG tablet Take 25 mg by mouth every 6 (six) hours as needed (for bee stings).     fulvestrant (FASLODEX) 250 MG/5ML injection Inject 250 mg into the muscle every 28 (twenty-eight) days. 06/16/2021: Pt could not verify current dose but did provide date of last injection. No pharmacy records in [facility]   loperamide (IMODIUM) 2 MG capsule 2-4 mg See admin instructions. Take 2 tablets (4 mg) by mouth for first loose stool, then 1 tablet (2 mg) for every loose stool thereafter. Do not exceed 8 tablets (16 mg) in a 24 hour period. Stop loperamide if 12 hours have passed without  a loose stool.    loratadine (CLARITIN) 10 MG tablet Take 10 mg by mouth every morning.    Magnesium 500 MG CAPS Take 500 mg by mouth every morning. Take with a 250 mg capsule for a total dose of 750 mg daily    MAGNESIUM PO Take 250 mg by mouth every morning. Take with a 500 mg capsule for a total dose of 750 mg daily    Multiple Vitamin (MULTIVITAMIN WITH MINERALS) TABS tablet Take 1 tablet by  mouth every morning. Centrum    omeprazole (PRILOSEC) 20 MG capsule Take 1 capsule by mouth daily (Stacey taking differently: 20 mg every morning.)    palbociclib (IBRANCE) 100 MG tablet Take 1 tablet (100 mg total) by mouth Monday through Friday for 3 weeks, then one week off, then repeat    prochlorperazine (COMPAZINE) 10 MG tablet Take 1 tablet (10 mg total) by mouth every 6 (six) hours as needed for nausea or vomiting.    SPIRIVA RESPIMAT 2.5 MCG/ACT AERS INHALE 2 PUFFS INTO THE LUNGS DAILY (Stacey taking differently: Inhale 2 puffs into the lungs every morning.)    telmisartan (MICARDIS) 40 MG tablet Take 1 tablet (40 mg total) by mouth every morning.    triamcinolone (NASACORT) 55 MCG/ACT AERO nasal inhaler Place 2 sprays into the nose daily. 2 sprays each nostril at night before bedtime (Stacey taking differently: Place 2 sprays into the nose See admin instructions. Instill 2 sprays into each nostril every night before bedtime)    triamterene-hydrochlorothiazide (DYAZIDE) 37.5-25 MG capsule TAKE 1 CAPSULE BY MOUTH EVERY MORNING (Stacey taking differently: Take 1 capsule by mouth every morning.)    zolpidem (AMBIEN) 10 MG tablet TAKE 1 TABLET(10 MG) BY MOUTH AT BEDTIME AS NEEDED (Stacey taking differently: Take 5 mg by mouth at bedtime.)    No facility-administered encounter medications on file as of 07/22/2021.   Stacey Active Problem List   Diagnosis Date Noted   Acute upper respiratory infection 05/16/2021   Nasal dryness 02/04/2021   Chronic sinusitis 11/25/2020   Allergic rhinitis 07/08/2020   Hypomagnesemia 06/10/2020   Goals of care, counseling/discussion 06/21/2019   Family history of breast cancer    Family history of pancreatic cancer    Family history of uterine cancer    Family history of lymphoma    Family history of lung cancer    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[place]) 06/02/2019   Recurrent cancer of left breast ([place]) 05/15/2019   Pes anserine bursitis  03/31/2019   HTN (hypertension) 02/10/2019   Hyperglycemia 06/21/2018   Left carpal tunnel syndrome 02/22/2018   Rotator cuff arthropathy of left shoulder 09/20/2017   Arthritis of hand 08/23/2017   Pain in right hand 08/23/2017   Cervical radiculitis 04/09/2017   Left shoulder pain 04/09/2017   Dyspnea on exertion 05/14/2016   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   Lymphedema of left arm 01/14/2016   Left arm swelling 12/25/2015   SVT (supraventricular tachycardia) (HCC)    Left-sided carotid artery disease ([place]) 03/08/2015   Dizziness 01/24/2015   Urinary frequency 01/13/2015   Dizziness and giddiness 01/09/2015   Gallstones 02/21/2013   Malignant neoplasm of upper-inner quadrant of left breast in female, estrogen receptor positive ([place]) 11/28/2012   Muscle cramping 09/12/2012   Right hip pain 09/12/2012   Lower back pain 09/12/2012   COPD GOLD I     Hx of radiation therapy    Right shoulder pain 09/14/2011  Preventative health care 07/29/2010   Skin lesion of left leg 07/29/2010   Paresthesia 07/29/2010   GERD 03/28/2010   CONSTIPATION 03/28/2010   Osteoarthrosis, 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   Anxiety state 05/03/2009   Hyperlipidemia 06/14/2007   FATIGUE 06/14/2007   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   DIVERTICULOSIS, COLON 12/17/2006   Cough 12/17/2006   IRRITABLE BOWEL SYNDROME, HX OF 12/17/2006   Conditions to be addressed/monitored:  HTN and CA  Care Plan : RN Care Manager Plan of Care  Updates made by Knox Royalty, RN since 07/23/2021 12:00 AM     Problem: Chronic disease management needs   Priority: Medium     Long-Range Goal: Ongoing adherence to established plan of care for long term disease management   Start Date: 07/22/2021  Expected End Date: 07/23/2022  Priority: Medium  Note:   Current Barriers:  Chronic Disease Management support and education needs related to HTN and Cancer Recent oral surgery 02/21/21 for removal of  implants secondary to complications from Xgeva therapy-- decreased ability to eat/ drink  RNCM Clinical Goal(s):  Stacey will demonstrate ongoing adherence to prescribed treatment plan for HTN and Cancer as evidenced by adherence to prescribed medication regimen contacting provider for new or worsened symptoms or questions attending all provider appointments    Interventions: 1:1 collaboration with primary care provider regarding development and update of comprehensive plan of care as evidenced by provider attestation and co-signature Inter-disciplinary care team collaboration (see longitudinal plan of care) Evaluation of current treatment plan related to  self management and Stacey's adherence to plan as established by provider Review of Stacey status, including review of consultants reports, relevant laboratory and other test results, and medications completed 07/22/21: CCM RN CM care plan goals re-established and extended: Stacey requests ongoing follow up from CCM RN CM SDOH updated: no new/ unmet concerns identified Pain assessment updated: denies pain today Falls assessment updated: continues to deny new/ recent falls x 12 months- currently not using assistive devices;  positive reinforcement provided with encouragement to continue efforts at fall prevention; previously provided education around fall risks/ prevention reinforced Medications discussed: reports continues to independently self-manage and denies current concerns/ issues/ questions around medications; endorses adherence to taking all medications as prescribed Reviewed recent provider office visits: oncology 03/13/21, 04/10/21 and 07/03/21; PCP 07/11/21- Stacey verbalizes good understanding of post-visit instructions and denies questions; confirmed no recent changes to medications, overall plan of care Reviewed upcoming scheduled provider appointments: 07/31/21 and 08/28/21 and 09/25/21- oncology provider/ treatment injection; 11/25/21-  pulmonary provider; Stacey confirms is aware of all and has plans to attend as scheduled Discussed plans with Stacey for ongoing care management follow up and provided Stacey with direct contact information for care management team     Hypertension: (Status: 07/22/21: Goal on Track (progressing): YES.) Long Term goal Last practice recorded BP readings:  BP Readings from Last 3 Encounters:  07/11/21 122/60  07/03/21 (!) 120/54  06/16/21 (!) 165/69  Most recent eGFR/CrCl:  Lab Results  Component Value Date   EGFR 69 (L) 12/09/2015    No components found for: CRCL  Evaluation of current treatment plan related to hypertension self management and Stacey's adherence to plan as established by provider;   Reviewed prescribed diet heart healthy, low salt, DASH diet Discussed complications of poorly controlled blood pressure such as heart disease, stroke, circulatory complications, vision complications, kidney impairment, sexual dysfunction;  Confirmed Stacey does not routinely monitor/ record blood pressures at home, continues to track through medical office  visits: reports "blood pressures have all been good;" we reviewed recent blood pressures obtained at provider office visits; no concerns identified Confirmed no recent changes to medications; Stacey continues taking medications independently and denies concerns around medications  Breast CA  (Status: 02/25/21: Goal on Track (progressing): YES.)  Long Term goal Evaluation of current treatment plan related to  cancer ,     self-management and Stacey's adherence to plan as established by provider. Confirmed no changes to overall plan of care- she is well-established with oncology provider and followed regularly Confirmed she discussed recent dental issues with oncology provider: dental issues resulted from previous treatments, as above Confirmed no change in status of lymphedema in UE: reports "the same as it has been;" confirms, "they don't  expect it to get better, looks like I am going to have to learn how to live with it"  Stacey Goals/Self-Care Activities: As evidenced by review of EHR, collaboration with care team, and Stacey reporting during CCM RN CM outreach,  Stacey [name] will: Take medications as prescribed Attend all scheduled provider appointments Call pharmacy for medication refills Call provider office for new concerns or questions Continue to follow heart healthy, low salt diet Keep up the great work preventing falls Stay as active as possible around weather conditions    Plan: Telephone follow up appointment with care management team member scheduled for: Tuesday, January 13, 2022 at 3:00 pm The Stacey has been provided with contact information for the care management team and has been advised to call with any health related questions or concerns  [name], RN, BSN, [place] [phone]: direct office

## 2021-07-23 NOTE — Telephone Encounter (Signed)
This RN spoke with pt per her VM late yesterday stating she " figured out why I wasn't getting my medication" " I called [facility] and they told me they had the refills but they were waiting on me to call to send them"  She states she will get them this Thursday. She took her last ibrance 2 weeks ago and was not able to complete a full cycle.  Per review with MD- pt should start next 21 day cycle on 6/26.  Pt verbalized understanding.  She is scheduled for lab and injectin on 6/29

## 2021-07-29 ENCOUNTER — Ambulatory Visit: Payer: Medicare Other | Admitting: *Deleted

## 2021-07-29 DIAGNOSIS — C50212 Malignant neoplasm of upper-inner quadrant of left female breast: Secondary | ICD-10-CM

## 2021-07-29 DIAGNOSIS — I10 Essential (primary) hypertension: Secondary | ICD-10-CM

## 2021-07-31 ENCOUNTER — Other Ambulatory Visit: Payer: Self-pay

## 2021-07-31 ENCOUNTER — Inpatient Hospital Stay: Payer: Medicare Other

## 2021-07-31 VITALS — BP 120/60 | HR 95 | Temp 98.5°F | Resp 20

## 2021-07-31 DIAGNOSIS — M8580 Other specified disorders of bone density and structure, unspecified site: Secondary | ICD-10-CM | POA: Diagnosis not present

## 2021-07-31 DIAGNOSIS — Z170 Estrogen receptor positive status [ER+]: Secondary | ICD-10-CM | POA: Diagnosis not present

## 2021-07-31 DIAGNOSIS — C50212 Malignant neoplasm of upper-inner quadrant of left female breast: Secondary | ICD-10-CM

## 2021-07-31 DIAGNOSIS — Z79818 Long term (current) use of other agents affecting estrogen receptors and estrogen levels: Secondary | ICD-10-CM | POA: Diagnosis not present

## 2021-07-31 DIAGNOSIS — C50211 Malignant neoplasm of upper-inner quadrant of right female breast: Secondary | ICD-10-CM | POA: Diagnosis not present

## 2021-07-31 DIAGNOSIS — Z79811 Long term (current) use of aromatase inhibitors: Secondary | ICD-10-CM | POA: Diagnosis not present

## 2021-07-31 DIAGNOSIS — C7951 Secondary malignant neoplasm of bone: Secondary | ICD-10-CM

## 2021-07-31 LAB — CBC WITH DIFFERENTIAL (CANCER CENTER ONLY)
Abs Immature Granulocytes: 0.04 10*3/uL (ref 0.00–0.07)
Basophils Absolute: 0 10*3/uL (ref 0.0–0.1)
Basophils Relative: 1 %
Eosinophils Absolute: 0.1 10*3/uL (ref 0.0–0.5)
Eosinophils Relative: 2 %
HCT: 30.6 % — ABNORMAL LOW (ref 36.0–46.0)
Hemoglobin: 10.3 g/dL — ABNORMAL LOW (ref 12.0–15.0)
Immature Granulocytes: 1 %
Lymphocytes Relative: 14 %
Lymphs Abs: 0.8 10*3/uL (ref 0.7–4.0)
MCH: 35.9 pg — ABNORMAL HIGH (ref 26.0–34.0)
MCHC: 33.7 g/dL (ref 30.0–36.0)
MCV: 106.6 fL — ABNORMAL HIGH (ref 80.0–100.0)
Monocytes Absolute: 0.6 10*3/uL (ref 0.1–1.0)
Monocytes Relative: 10 %
Neutro Abs: 4.3 10*3/uL (ref 1.7–7.7)
Neutrophils Relative %: 72 %
Platelet Count: 313 10*3/uL (ref 150–400)
RBC: 2.87 MIL/uL — ABNORMAL LOW (ref 3.87–5.11)
RDW: 13.2 % (ref 11.5–15.5)
WBC Count: 5.9 10*3/uL (ref 4.0–10.5)
nRBC: 0 % (ref 0.0–0.2)

## 2021-07-31 LAB — CMP (CANCER CENTER ONLY)
ALT: 13 U/L (ref 0–44)
AST: 21 U/L (ref 15–41)
Albumin: 4.2 g/dL (ref 3.5–5.0)
Alkaline Phosphatase: 55 U/L (ref 38–126)
Anion gap: 12 (ref 5–15)
BUN: 26 mg/dL — ABNORMAL HIGH (ref 8–23)
CO2: 20 mmol/L — ABNORMAL LOW (ref 22–32)
Calcium: 9.8 mg/dL (ref 8.9–10.3)
Chloride: 107 mmol/L (ref 98–111)
Creatinine: 1.29 mg/dL — ABNORMAL HIGH (ref 0.44–1.00)
GFR, Estimated: 42 mL/min — ABNORMAL LOW (ref >60)
Glucose, Bld: 112 mg/dL — ABNORMAL HIGH (ref 70–99)
Potassium: 3.9 mmol/L (ref 3.5–5.1)
Sodium: 139 mmol/L (ref 135–145)
Total Bilirubin: 0.3 mg/dL (ref 0.3–1.2)
Total Protein: 7.5 g/dL (ref 6.5–8.1)

## 2021-07-31 LAB — MAGNESIUM: Magnesium: 1.2 mg/dL — ABNORMAL LOW (ref 1.7–2.4)

## 2021-07-31 MED ORDER — FULVESTRANT 250 MG/5ML IM SOSY
500.0000 mg | PREFILLED_SYRINGE | Freq: Once | INTRAMUSCULAR | Status: AC
  Administered 2021-07-31: 500 mg via INTRAMUSCULAR
  Filled 2021-07-31: qty 10

## 2021-07-31 MED ORDER — FULVESTRANT 250 MG/5ML IM SOLN: 500.0000 mg | Freq: Once | INTRAMUSCULAR | Status: DC

## 2021-08-01 DIAGNOSIS — I10 Essential (primary) hypertension: Secondary | ICD-10-CM | POA: Diagnosis not present

## 2021-08-01 DIAGNOSIS — C50212 Malignant neoplasm of upper-inner quadrant of left female breast: Secondary | ICD-10-CM | POA: Diagnosis not present

## 2021-08-01 DIAGNOSIS — Z170 Estrogen receptor positive status [ER+]: Secondary | ICD-10-CM | POA: Diagnosis not present

## 2021-08-01 LAB — CANCER ANTIGEN 27.29: CA 27.29: 56 U/mL — ABNORMAL HIGH (ref 0.0–38.6)

## 2021-08-06 ENCOUNTER — Telehealth: Payer: Self-pay | Admitting: *Deleted

## 2021-08-06 NOTE — Telephone Encounter (Signed)
Pt verified she is taking 750 mg magnesium daily.   She states she does not notice increased fatigue " just over the past 3 years that we have been doing this I am noticing gradual decreased energy "  Per discussion and plan appt for her next inj will be made for Infusion- therefore if IV magnesium needed (if 1.2 or less ) or she is symptomatic we can do IV replacement.  Pt is in agreement to above plan.

## 2021-08-07 ENCOUNTER — Encounter: Payer: Self-pay | Admitting: Hematology and Oncology

## 2021-08-28 ENCOUNTER — Inpatient Hospital Stay: Payer: Medicare Other

## 2021-08-28 ENCOUNTER — Inpatient Hospital Stay: Payer: Medicare Other | Attending: Adult Health | Admitting: Hematology and Oncology

## 2021-08-28 ENCOUNTER — Other Ambulatory Visit: Payer: Self-pay | Admitting: *Deleted

## 2021-08-28 ENCOUNTER — Other Ambulatory Visit: Payer: Self-pay

## 2021-08-28 ENCOUNTER — Encounter: Payer: Self-pay | Admitting: Hematology and Oncology

## 2021-08-28 VITALS — BP 122/58 | HR 81 | Resp 19

## 2021-08-28 VITALS — BP 136/67 | HR 105 | Temp 97.9°F | Resp 20 | Ht 63.0 in | Wt 142.0 lb

## 2021-08-28 DIAGNOSIS — Z90710 Acquired absence of both cervix and uterus: Secondary | ICD-10-CM | POA: Diagnosis not present

## 2021-08-28 DIAGNOSIS — Z8043 Family history of malignant neoplasm of testis: Secondary | ICD-10-CM | POA: Insufficient documentation

## 2021-08-28 DIAGNOSIS — Z87891 Personal history of nicotine dependence: Secondary | ICD-10-CM | POA: Diagnosis not present

## 2021-08-28 DIAGNOSIS — Z807 Family history of other malignant neoplasms of lymphoid, hematopoietic and related tissues: Secondary | ICD-10-CM | POA: Insufficient documentation

## 2021-08-28 DIAGNOSIS — C7951 Secondary malignant neoplasm of bone: Secondary | ICD-10-CM

## 2021-08-28 DIAGNOSIS — Z800 Family history of malignant neoplasm of digestive organs: Secondary | ICD-10-CM | POA: Diagnosis not present

## 2021-08-28 DIAGNOSIS — C50212 Malignant neoplasm of upper-inner quadrant of left female breast: Secondary | ICD-10-CM | POA: Insufficient documentation

## 2021-08-28 DIAGNOSIS — I10 Essential (primary) hypertension: Secondary | ICD-10-CM | POA: Insufficient documentation

## 2021-08-28 DIAGNOSIS — Z170 Estrogen receptor positive status [ER+]: Secondary | ICD-10-CM | POA: Insufficient documentation

## 2021-08-28 DIAGNOSIS — Z801 Family history of malignant neoplasm of trachea, bronchus and lung: Secondary | ICD-10-CM | POA: Insufficient documentation

## 2021-08-28 DIAGNOSIS — Z5111 Encounter for antineoplastic chemotherapy: Secondary | ICD-10-CM | POA: Diagnosis not present

## 2021-08-28 DIAGNOSIS — Z803 Family history of malignant neoplasm of breast: Secondary | ICD-10-CM | POA: Diagnosis not present

## 2021-08-28 DIAGNOSIS — Z79811 Long term (current) use of aromatase inhibitors: Secondary | ICD-10-CM | POA: Insufficient documentation

## 2021-08-28 DIAGNOSIS — Z808 Family history of malignant neoplasm of other organs or systems: Secondary | ICD-10-CM | POA: Insufficient documentation

## 2021-08-28 DIAGNOSIS — M8580 Other specified disorders of bone density and structure, unspecified site: Secondary | ICD-10-CM | POA: Insufficient documentation

## 2021-08-28 DIAGNOSIS — C50211 Malignant neoplasm of upper-inner quadrant of right female breast: Secondary | ICD-10-CM | POA: Diagnosis present

## 2021-08-28 DIAGNOSIS — M879 Osteonecrosis, unspecified: Secondary | ICD-10-CM | POA: Insufficient documentation

## 2021-08-28 LAB — CBC WITH DIFFERENTIAL (CANCER CENTER ONLY)
Abs Immature Granulocytes: 0.01 10*3/uL (ref 0.00–0.07)
Basophils Absolute: 0 10*3/uL (ref 0.0–0.1)
Basophils Relative: 1 %
Eosinophils Absolute: 0 10*3/uL (ref 0.0–0.5)
Eosinophils Relative: 1 %
HCT: 28.4 % — ABNORMAL LOW (ref 36.0–46.0)
Hemoglobin: 9.9 g/dL — ABNORMAL LOW (ref 12.0–15.0)
Immature Granulocytes: 0 %
Lymphocytes Relative: 24 %
Lymphs Abs: 0.6 10*3/uL — ABNORMAL LOW (ref 0.7–4.0)
MCH: 36.3 pg — ABNORMAL HIGH (ref 26.0–34.0)
MCHC: 34.9 g/dL (ref 30.0–36.0)
MCV: 104 fL — ABNORMAL HIGH (ref 80.0–100.0)
Monocytes Absolute: 0.3 10*3/uL (ref 0.1–1.0)
Monocytes Relative: 13 %
Neutro Abs: 1.6 10*3/uL — ABNORMAL LOW (ref 1.7–7.7)
Neutrophils Relative %: 61 %
Platelet Count: 171 10*3/uL (ref 150–400)
RBC: 2.73 MIL/uL — ABNORMAL LOW (ref 3.87–5.11)
RDW: 14.5 % (ref 11.5–15.5)
Smear Review: NORMAL
WBC Count: 2.7 10*3/uL — ABNORMAL LOW (ref 4.0–10.5)
nRBC: 0 % (ref 0.0–0.2)

## 2021-08-28 LAB — CMP (CANCER CENTER ONLY)
ALT: 11 U/L (ref 0–44)
AST: 20 U/L (ref 15–41)
Albumin: 4.5 g/dL (ref 3.5–5.0)
Alkaline Phosphatase: 54 U/L (ref 38–126)
Anion gap: 11 (ref 5–15)
BUN: 33 mg/dL — ABNORMAL HIGH (ref 8–23)
CO2: 20 mmol/L — ABNORMAL LOW (ref 22–32)
Calcium: 9.4 mg/dL (ref 8.9–10.3)
Chloride: 110 mmol/L (ref 98–111)
Creatinine: 1.13 mg/dL — ABNORMAL HIGH (ref 0.44–1.00)
GFR, Estimated: 49 mL/min — ABNORMAL LOW (ref >60)
Glucose, Bld: 103 mg/dL — ABNORMAL HIGH (ref 70–99)
Potassium: 3.9 mmol/L (ref 3.5–5.1)
Sodium: 141 mmol/L (ref 135–145)
Total Bilirubin: 0.3 mg/dL (ref 0.3–1.2)
Total Protein: 8.1 g/dL (ref 6.5–8.1)

## 2021-08-28 LAB — MAGNESIUM: Magnesium: 0.9 mg/dL — CL (ref 1.7–2.4)

## 2021-08-28 MED ORDER — FULVESTRANT 250 MG/5ML IM SOSY
500.0000 mg | PREFILLED_SYRINGE | Freq: Once | INTRAMUSCULAR | Status: AC
  Administered 2021-08-28: 500 mg via INTRAMUSCULAR
  Filled 2021-08-28: qty 10

## 2021-08-28 MED ORDER — MAGNESIUM SULFATE 2 GM/50ML IV SOLN
2.0000 g | Freq: Once | INTRAVENOUS | Status: AC
  Administered 2021-08-28: 2 g via INTRAVENOUS
  Filled 2021-08-28: qty 50

## 2021-08-28 MED ORDER — SODIUM CHLORIDE 0.9 % IV SOLN: Freq: Once | INTRAVENOUS | Status: AC

## 2021-08-28 NOTE — Patient Instructions (Addendum)
Magnesium Sulfate Injection What is this medication? MAGNESIUM SULFATE (mag NEE zee um SUL fate) prevents and treats low levels of magnesium in your body. It may also be used to prevent and treat seizures during pregnancy in people with high blood pressure disorders, such as preeclampsia or eclampsia. Magnesium plays an important role in maintaining the health of your muscles and nervous system. This medicine may be used for other purposes; ask your health care provider or pharmacist if you have questions. What should I tell my care team before I take this medication? They need to know if you have any of these conditions: Heart disease History of irregular heart beat Kidney disease An unusual or allergic reaction to magnesium sulfate, medications, foods, dyes, or preservatives Pregnant or trying to get pregnant Breast-feeding How should I use this medication? This medication is for infusion into a vein. It is given in a hospital or clinic setting. Talk to your care team about the use of this medication in children. While this medication may be prescribed for selected conditions, precautions do apply. Overdosage: If you think you have taken too much of this medicine contact a poison control center or emergency room at once. NOTE: This medicine is only for you. Do not share this medicine with others. What if I miss a dose? This does not apply. What may interact with this medication? Certain medications for anxiety or sleep Certain medications for seizures like phenobarbital Digoxin Medications that relax muscles for surgery Narcotic medications for pain This list may not describe all possible interactions. Give your health care provider a list of all the medicines, herbs, non-prescription drugs, or dietary supplements you use. Also tell them if you smoke, drink alcohol, or use illegal drugs. Some items may interact with your medicine. What should I watch for while using this medication? Your  condition will be monitored carefully while you are receiving this medication. You may need blood work done while you are receiving this medication. What side effects may I notice from receiving this medication? Side effects that you should report to your care team as soon as possible: Allergic reactions--skin rash, itching, hives, swelling of the face, lips, tongue, or throat High magnesium level--confusion, drowsiness, facial flushing, redness, sweating, muscle weakness, fast or irregular heartbeat, trouble breathing Low blood pressure--dizziness, feeling faint or lightheaded, blurry vision Side effects that usually do not require medical attention (report to your care team if they continue or are bothersome): Headache Nausea This list may not describe all possible side effects. Call your doctor for medical advice about side effects. You may report side effects to FDA at 1-800-FDA-1088. Where should I keep my medication? This medication is given in a hospital or clinic and will not be stored at home. NOTE: This sheet is a summary. It may not cover all possible information. If you have questions about this medicine, talk to your doctor, pharmacist, or health care provider.  2023 Elsevier/Gold Standard (2020-04-04 00:00:00)  Fulvestrant injection What is this medication? FULVESTRANT (ful VES trant) blocks the effects of estrogen. It is used to treat breast cancer. This medicine may be used for other purposes; ask your health care provider or pharmacist if you have questions. COMMON BRAND NAME(S): FASLODEX What should I tell my care team before I take this medication? They need to know if you have any of these conditions: bleeding disorders liver disease low blood counts, like low white cell, platelet, or red cell counts an unusual or allergic reaction to fulvestrant, other medicines,  foods, dyes, or preservatives pregnant or trying to get pregnant breast-feeding How should I use this  medication? This medicine is for injection into a muscle. It is usually given by a health care professional in a hospital or clinic setting. Talk to your pediatrician regarding the use of this medicine in children. Special care may be needed. Overdosage: If you think you have taken too much of this medicine contact a poison control center or emergency room at once. NOTE: This medicine is only for you. Do not share this medicine with others. What if I miss a dose? It is important not to miss your dose. Call your doctor or health care professional if you are unable to keep an appointment. What may interact with this medication? medicines that treat or prevent blood clots like warfarin, enoxaparin, dalteparin, apixaban, dabigatran, and rivaroxaban This list may not describe all possible interactions. Give your health care provider a list of all the medicines, herbs, non-prescription drugs, or dietary supplements you use. Also tell them if you smoke, drink alcohol, or use illegal drugs. Some items may interact with your medicine. What should I watch for while using this medication? Your condition will be monitored carefully while you are receiving this medicine. You will need important blood work done while you are taking this medicine. Do not become pregnant while taking this medicine or for at least 1 year after stopping it. Women of child-bearing potential will need to have a negative pregnancy test before starting this medicine. Women should inform their doctor if they wish to become pregnant or think they might be pregnant. There is a potential for serious side effects to an unborn child. Men should inform their doctors if they wish to father a child. This medicine may lower sperm counts. Talk to your health care professional or pharmacist for more information. Do not breast-feed an infant while taking this medicine or for 1 year after the last dose. What side effects may I notice from receiving this  medication? Side effects that you should report to your doctor or health care professional as soon as possible: allergic reactions like skin rash, itching or hives, swelling of the face, lips, or tongue feeling faint or lightheaded, falls pain, tingling, numbness, or weakness in the legs signs and symptoms of infection like fever or chills; cough; flu-like symptoms; sore throat vaginal bleeding Side effects that usually do not require medical attention (report to your doctor or health care professional if they continue or are bothersome): aches, pains constipation diarrhea headache hot flashes nausea, vomiting pain at site where injected stomach pain This list may not describe all possible side effects. Call your doctor for medical advice about side effects. You may report side effects to FDA at 1-800-FDA-1088. Where should I keep my medication? This drug is given in a hospital or clinic and will not be stored at home. NOTE: This sheet is a summary. It may not cover all possible information. If you have questions about this medicine, talk to your doctor, pharmacist, or health care provider.  2023 Elsevier/Gold Standard (2017-05-04 00:00:00)

## 2021-08-28 NOTE — Progress Notes (Signed)
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   #2 status post 4 cycles of doxorubicin and cyclophosphamide,                          (i) followed by radiation therapy under the care of Dr. [name].   #3 received tamoxifen for a total of seven years    RIGHT BREAST #4  S/P biopsy of the RIGHT breast upper inner quadrant on 11/14/10 showing invasive ductal carcinoma,, grade 2, estrogen receptor 85% and progesterone receptor 57% positive, Ki67 20%, HER2 not amplified.     #5 started neoadjuvant letrozole in November 2012; switched to tamoxifen as of February 2016 due to osteopenia concerns   #6  S/P right lumpectomy with sentinel lymph node biopsy on 09/03/11 for a ypT2, ypN1a, stage IIB invasive lobular carcinoma, grade 2,estrogen receptor 97% positive, progesterone receptor 12% positive, with no HER-2 amplification.   #7 status post right breast radiation therapy under the care of Dr. [name] from 10/19/2011 to 12/03/2011.    #8 did not meet criteria for genetic testing according to her insurance company.   #9 osteopenia, with a T score of -1.8 on DEXA scan at [facility] 03/07/2013             (a) status post multiple dental extractions and implants             (b) repeat bone density at [facility] 04/02/2015 shows a T score of -2.0   METASTATIC DISEASE: April 2021 (lymph nodes, bone)   #10:  left upper extremity lymphedema led to chest CT scan 05/09/2019 showing a 5.1 cm left subpectoral chest wall mass and thoracic lymphadenopathy              (a) CT biopsy of the chest wall mass 05/25/2019 confirms recurrent breast  cancer, strongly estrogen and progesterone receptor positive, HER-2 not amplified             (b) PET scan 05/30/2019 shows a left subpectoral mass measuring 5.4 cm, with significant regional and mediastinal adenopathy, sclerotic left scapular metastasis, but no liver or lung involvement.             (c) CA 27-29 is moderately informative   #11 anastrozole started 06/02/2019; palbociclib added 06/08/2019             (a) palbociclib taken irregularly for several months secondary to cytopenias             (b) palbociclib dose decreased to 100 mg daily 21 on 7 off October 2021             (c) CT of the chest with contrast 11/09/2019 shows mild disease progression (despite a continuing drop in the CA 27-29)             (d) fulvestrant added 11/23/2019             (e) repeat chest CT with contrast 09/09/2020 read as stable.   #12 denosumab/Xgeva starting 06/08/2019, to be repeated every 3 months due to hypocalcemia             (  a) treatment held after August 2022 dose secondary to ongoing dental issues   #13 genetics testing 06/15/2019 through the [facility] Common Hereditary Cancers Panel found no deleterious mutations in APC, ATM, AXIN2, BARD1, BMPR1A, BRCA1, BRCA2, BRIP1, CDH1, CDKN2A (p14ARF), CDKN2A (p16INK4a), CKD4, CHEK2, CTNNA1, DICER1, EPCAM (Deletion/duplication testing only), GREM1 (promoter region deletion/duplication testing only), KIT, MEN1, MLH1, MSH2, MSH3, MSH6, MUTYH, NBN, NF1, NHTL1, PALB2, PDGFRA, PMS2, POLD1, POLE, PTEN, RAD50, RAD51C, RAD51D, RNF43, SDHB, SDHC, SDHD, SMAD4, SMARCA4. STK11, TP53, TSC1, TSC2, and VHL.  The following genes were evaluated for sequence changes only: SDHA and HOXB13 c.251G>A variant only.  CURRENT THERAPY: Anastrozole, Palbociclib, Fulvestrant  INTERVAL HISTORY:  Stacey [name] 81 y.o. female returns for follow-up follow-up of her metastatic breast cancer.   She is also taking palbociclib dosed at 100 mg 3 weeks on and 1 week off.  She is currently in her  first week.  Additionally she receives fulvestrant every 4 weeks and anastrozole daily.  She tolerates this well with no issues. She is doing quite well on this combination. Right hip hurts from time to time, but not on a daily basis. She otherwise was wondering if she can get COVID booster, flu vaccine and a couple other vaccines.   Rest of the pertinent 10 point ROS reviewed and negative   Patient Active Problem List   Diagnosis Date Noted   Acute upper respiratory infection 05/16/2021   Nasal dryness 02/04/2021   Chronic sinusitis 11/25/2020   Allergic rhinitis 07/08/2020   Hypomagnesemia 06/10/2020   Goals of care, counseling/discussion 06/21/2019   Family history of breast cancer    Family history of pancreatic cancer    Family history of uterine cancer    Family history of lymphoma    Family history of lung cancer    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[place]) 06/02/2019   Recurrent cancer of left breast ([place]) 05/15/2019   Pes anserine bursitis 03/31/2019   HTN (hypertension) 02/10/2019   Hyperglycemia 06/21/2018   Left carpal tunnel syndrome 02/22/2018   Rotator cuff arthropathy of left shoulder 09/20/2017   Arthritis of hand 08/23/2017   Pain in right hand 08/23/2017   Cervical radiculitis 04/09/2017   Left shoulder pain 04/09/2017   Dyspnea on exertion 05/14/2016   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   Lymphedema of left arm 01/14/2016   Left arm swelling 12/25/2015   SVT (supraventricular tachycardia) ([place])    Left-sided carotid artery disease ([place]) 03/08/2015   Dizziness 01/24/2015   Urinary frequency 01/13/2015   Dizziness and giddiness 01/09/2015   Gallstones 02/21/2013   Malignant neoplasm of upper-inner quadrant of left breast in female, estrogen receptor positive ([place]) 11/28/2012   Muscle cramping 09/12/2012   Right hip pain 09/12/2012   Lower back pain 09/12/2012   COPD GOLD I     Hx of radiation therapy    Right shoulder  pain 09/14/2011   Preventative health care 07/29/2010   Skin lesion of left leg 07/29/2010   Paresthesia 07/29/2010   GERD 03/28/2010   CONSTIPATION 03/28/2010   Osteoarthrosis, 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  MEDICAL HISTORY: Past Medical History:  Diagnosis Date   ANEMIA-NOS    ANXIETY  Breast cancer ([place]) 1997 L, 2012 R   s/p chemo/xrt   COPD    resolved   DIVERTICULOSIS, COLON 2008   Dizziness    Family history of breast cancer    Family history of lung cancer    Family history of lymphoma    Family history of pancreatic cancer    Family history of uterine cancer    GERD    Hx of radiation therapy 10/19/11 -12/03/11   right breast   HYPERLIPIDEMIA    IRRITABLE BOWEL SYNDROME, HX OF    Left-sided carotid artery disease (H[facility])    moderate left ICA stenosis   OSTEOARTHRITIS, HAND    PSVT (paroxysmal supraventricular tachycardia) ([place])    symptomatic on event monitor    SURGICAL HISTORY: Past Surgical History:  Procedure Laterality Date   ABDOMINAL HYSTERECTOMY     APPENDECTOMY     BREAST BIOPSY  11/14/10    r breast: inv, insitu mammary carcinoma w/calcif, er/pr +, her2 -   BREAST SURGERY     lumpectomy   CATARACT EXTRACTION     both eyes   ELECTROPHYSIOLOGIC STUDY N/A 05/10/2015   Procedure: SVT Ablation;  Surgeon: Will Meredith Leeds, MD;  Location: [facility];  Service: Cardiovascular;  Laterality: N/A;   HERNIA REPAIR     inguinal herniorrhapy  1984   left   IR US GUIDE BX ASP/DRAIN  05/26/2019   rectal fissure repair     s/p benign breast biopsy  2003   right   s/p left foot surgury  2009   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   spiral fx left foot   2008   no surgury   TMJ ARTHROPLASTY  1989   TONGUE SURGERY     1988- to remove scar tissue growth    TONSILLECTOMY      SOCIAL HISTORY: Social History   Socioeconomic History   Marital status: Divorced    Spouse name: 2 Step-children   Number of children: 2   Years of education: Not on file   Highest education level: Not on file  Occupational History   Occupation: retired Banker  Tobacco Use   Smoking status: Former    Packs/day: 1.00    Years: 54.00    Total pack years: 54.00    Types: Cigarettes    Quit date: 01/03/2016    Years since quitting: 5.6   Smokeless tobacco: Never  Vaping Use   Vaping Use: Never used  Substance and Sexual Activity   Alcohol use: Yes    Comment: rare/ drinks socially   Drug use: No   Sexual activity: Never  Other Topics Concern   Not on file  Social History Narrative   Patient gets no regular exercise   No biological children   2 step children   Social Determinants of Health   Financial Resource Strain: Low Risk  (07/08/2020)   Overall Financial Resource Strain (CARDIA)    Difficulty of Paying Living Expenses: Not hard at all  Food Insecurity: No Food Insecurity (07/22/2021)   Hunger Vital Sign    Worried About Running Out of Food in the Last Year: Never true    Ran Out of Food in the Last Year: Never true  Transportation Needs: No Transportation Needs (07/22/2021)   PRAPARE - Hydrologist (Medical): No    Lack of Transportation (Non-Medical): No  Physical Activity: Sufficiently Active (07/08/2020)   Exercise Vital Sign  Days of Exercise per Week: 5 days    Minutes of Exercise per Session: 30 min  Stress: No Stress Concern Present (07/08/2020)   [place]    Feeling of Stress : Not at all  Social Connections: Moderately Integrated (07/08/2020)   Social Connection and Isolation Panel [NHANES]    Frequency of Communication with Friends and  Family: More than three times a week    Frequency of Social Gatherings with Friends and Family: More than three times a week    Attends Religious Services: More than 4 times per year    Active Member of [employer] or Organizations: No    Attends Music therapist: More than 4 times per year    Marital Status: Widowed  Intimate Partner Violence: Not At Risk (07/08/2020)   Humiliation, Afraid, Rape, and Kick questionnaire    Fear of Current or Ex-Partner: No    Emotionally Abused: No    Physically Abused: No    Sexually Abused: No    FAMILY HISTORY: Family History  Problem Relation Age of Onset   Hypertension Mother    Stroke Mother    Colon polyps Mother    Diabetes Mother    Pancreatic cancer Mother 87   Uterine cancer Mother 48   Lymphoma Brother        burkitts   Lung cancer Paternal Uncle    Lung cancer Maternal Grandmother 47       non-smoker   Lung cancer Maternal Grandfather    Breast cancer Cousin        maternal cousin, dx in her mid 57s   Brain cancer Cousin        maternal cousin's son; dx in his 61s   Testicular cancer Cousin        maternal cousin's son;    Breast cancer Cousin        paternal cousin; dx in her 80s   Breast cancer Cousin        paternal cousin's daughter; dx in 82s; neg genetic testing   Colon cancer Neg Hx    Esophageal cancer Neg Hx    Stomach cancer Neg Hx    Rectal cancer Neg Hx     Review of Systems  Constitutional:  Positive for fatigue. Negative for appetite change, chills, fever and unexpected weight change.  HENT:   Negative for hearing loss, lump/mass and trouble swallowing.   Eyes:  Negative for eye problems and icterus.  Respiratory:  Negative for chest tightness, cough and shortness of breath.   Cardiovascular:  Negative for chest pain, leg swelling and palpitations.  Gastrointestinal:  Negative for abdominal distention, abdominal pain, constipation, nausea and vomiting.  Endocrine: Negative for hot flashes.   Genitourinary:  Negative for difficulty urinating.   Musculoskeletal:  Positive for arthralgias.  Skin:  Negative for itching and rash.  Neurological:  Negative for dizziness, extremity weakness, headaches and numbness.  Hematological:  Negative for adenopathy. Does not bruise/bleed easily.  Psychiatric/Behavioral:  Negative for depression. The patient is not nervous/anxious.       PHYSICAL EXAMINATION  ECOG PERFORMANCE STATUS: 1 - Symptomatic but completely ambulatory  Vitals:   08/28/21 1440  BP: 136/67  Pulse: (!) 105  Resp: 20  Temp: 97.9 F (36.6 C)  SpO2: 99%    Physical Exam Constitutional:      General: She is not in acute distress.    Appearance: Normal appearance. She is not toxic-appearing.  HENT:  Head: Normocephalic and atraumatic.  Eyes:     General: No scleral icterus. Cardiovascular:     Rate and Rhythm: Normal rate and regular rhythm.     Pulses: Normal pulses.     Heart sounds: Normal heart sounds.  Pulmonary:     Effort: Pulmonary effort is normal.     Breath sounds: Normal breath sounds.  Abdominal:     General: Abdomen is flat. Bowel sounds are normal. There is no distension.     Palpations: Abdomen is soft.     Tenderness: There is no abdominal tenderness.  Musculoskeletal:        General: No swelling.     Cervical back: Neck supple.  Lymphadenopathy:     Cervical: No cervical adenopathy.  Skin:    General: Skin is warm and dry.     Findings: No rash.  Neurological:     General: No focal deficit present.     Mental Status: She is alert.  Psychiatric:        Mood and Affect: Mood normal.        Behavior: Behavior normal.     LABORATORY DATA: Appointment on 08/28/2021  Component Date Value Ref Range Status   WBC Count 08/28/2021 2.7 (L)  4.0 - 10.5 K/uL Final   RBC 08/28/2021 2.73 (L)  3.87 - 5.11 MIL/uL Final   Hemoglobin 08/28/2021 9.9 (L)  12.0 - 15.0 g/dL Final   HCT 08/28/2021 28.4 (L)  36.0 - 46.0 % Final   MCV  08/28/2021 104.0 (H)  80.0 - 100.0 fL Final   MCH 08/28/2021 36.3 (H)  26.0 - 34.0 pg Final   MCHC 08/28/2021 34.9  30.0 - 36.0 g/dL Final   RDW 08/28/2021 14.5  11.5 - 15.5 % Final   Platelet Count 08/28/2021 171  150 - 400 K/uL Final   nRBC 08/28/2021 0.0  0.0 - 0.2 % Final   Performed at [facility], [place] [address] [place], [place] [address]   Neutrophils Relative % 08/28/2021 PENDING  % Incomplete   Neutro Abs 08/28/2021 PENDING  1.7 - 7.7 K/uL Incomplete   Band Neutrophils 08/28/2021 PENDING  % Incomplete   Lymphocytes Relative 08/28/2021 PENDING  % Incomplete   Lymphs Abs 08/28/2021 PENDING  0.7 - 4.0 K/uL Incomplete   Monocytes Relative 08/28/2021 PENDING  % Incomplete   Monocytes Absolute 08/28/2021 PENDING  0.1 - 1.0 K/uL Incomplete   Eosinophils Relative 08/28/2021 PENDING  % Incomplete   Eosinophils Absolute 08/28/2021 PENDING  0.0 - 0.5 K/uL Incomplete   Basophils Relative 08/28/2021 PENDING  % Incomplete   Basophils Absolute 08/28/2021 PENDING  0.0 - 0.1 K/uL Incomplete   WBC Morphology 08/28/2021 PENDING   Incomplete   RBC Morphology 08/28/2021 PENDING   Incomplete   Smear Review 08/28/2021 PENDING   Incomplete   Other 08/28/2021 PENDING  % Incomplete   nRBC 08/28/2021 PENDING  0 /100 WBC Incomplete   Metamyelocytes Relative 08/28/2021 PENDING  % Incomplete   Myelocytes 08/28/2021 PENDING  % Incomplete   Promyelocytes Relative 08/28/2021 PENDING  % Incomplete   Blasts 08/28/2021 PENDING  % Incomplete   Immature Granulocytes 08/28/2021 PENDING  % Incomplete   Abs Immature Granulocytes 08/28/2021 PENDING  0.00 - 0.07 K/uL Incomplete     ASSESSMENT and THERAPY PLAN:   This is a very pleasant 81 year old female patient with metastatic breast cancer currently on palbociclib, and anastrozole as well as fulvestrant since about May 2021 who is here for follow-up.  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  She however continues to respond  very well to the current regimen.  Last pet imaging with no evidence of recurrent or metastatic disease, question left scapular metastasis.  She tolerates current regimen well except for some diarrhea.  She apparently had interruption with Ibrance for about 4 weeks since it was sent to the wrong address.   She continues to have severe hypomagnesemia and takes 750 mg p.o. daily, her magnesium was noted to be low today had she will get an additional 2 g of mag She takes oral magnesium about 750 mg daily but does not want to uptitrate this given diarrhea from magnesium. Repeat imaging ordered for September 2023. She will continue above-mentioned regimen, return to clinic in 8 weeks to review imaging as well as to discuss additional recommendations.  All questions were answered. The patient knows to call the clinic with any problems, questions or concerns. We can certainly see the patient much sooner if necessary.  Total encounter time:30 minutes*in face-to-face visit time, chart review, lab review, care coordination, order entry, and documentation of the encounter time.   *Total Encounter Time as defined by the Centers for Medicare and Medicaid Services includes, in addition to the face-to-face time of a patient visit (documented in the note above) non-face-to-face time: obtaining and reviewing outside history, ordering and reviewing medications, tests or procedures, care coordination (communications with other health care professionals or caregivers) and documentation in the medical record.

## 2021-08-29 LAB — CANCER ANTIGEN 27.29: CA 27.29: 47.5 U/mL — ABNORMAL HIGH (ref 0.0–38.6)

## 2021-09-04 ENCOUNTER — Encounter: Payer: Self-pay | Admitting: Oncology

## 2021-09-04 ENCOUNTER — Encounter: Payer: Self-pay | Admitting: Internal Medicine

## 2021-09-04 MED ORDER — OMEPRAZOLE 20 MG PO CPDR: DELAYED_RELEASE_CAPSULE | ORAL | 1 refills | Status: DC

## 2021-09-11 DIAGNOSIS — D485 Neoplasm of uncertain behavior of skin: Secondary | ICD-10-CM | POA: Diagnosis not present

## 2021-09-11 DIAGNOSIS — D0462 Carcinoma in situ of skin of left upper limb, including shoulder: Secondary | ICD-10-CM | POA: Diagnosis not present

## 2021-09-11 DIAGNOSIS — C44619 Basal cell carcinoma of skin of left upper limb, including shoulder: Secondary | ICD-10-CM | POA: Diagnosis not present

## 2021-09-23 DIAGNOSIS — D0462 Carcinoma in situ of skin of left upper limb, including shoulder: Secondary | ICD-10-CM | POA: Diagnosis not present

## 2021-09-23 DIAGNOSIS — C44619 Basal cell carcinoma of skin of left upper limb, including shoulder: Secondary | ICD-10-CM | POA: Diagnosis not present

## 2021-09-23 DIAGNOSIS — D485 Neoplasm of uncertain behavior of skin: Secondary | ICD-10-CM | POA: Diagnosis not present

## 2021-09-25 ENCOUNTER — Inpatient Hospital Stay: Payer: Medicare Other | Attending: Adult Health

## 2021-09-25 ENCOUNTER — Other Ambulatory Visit: Payer: Self-pay

## 2021-09-25 ENCOUNTER — Inpatient Hospital Stay: Payer: Medicare Other

## 2021-09-25 VITALS — BP 128/77 | HR 89 | Resp 18

## 2021-09-25 DIAGNOSIS — Z5111 Encounter for antineoplastic chemotherapy: Secondary | ICD-10-CM | POA: Insufficient documentation

## 2021-09-25 DIAGNOSIS — C7951 Secondary malignant neoplasm of bone: Secondary | ICD-10-CM | POA: Diagnosis not present

## 2021-09-25 DIAGNOSIS — Z79811 Long term (current) use of aromatase inhibitors: Secondary | ICD-10-CM | POA: Insufficient documentation

## 2021-09-25 DIAGNOSIS — Z170 Estrogen receptor positive status [ER+]: Secondary | ICD-10-CM | POA: Diagnosis not present

## 2021-09-25 DIAGNOSIS — C779 Secondary and unspecified malignant neoplasm of lymph node, unspecified: Secondary | ICD-10-CM | POA: Diagnosis not present

## 2021-09-25 DIAGNOSIS — C50211 Malignant neoplasm of upper-inner quadrant of right female breast: Secondary | ICD-10-CM | POA: Diagnosis not present

## 2021-09-25 LAB — CMP (CANCER CENTER ONLY)
ALT: 9 U/L (ref 0–44)
AST: 16 U/L (ref 15–41)
Albumin: 4.2 g/dL (ref 3.5–5.0)
Alkaline Phosphatase: 58 U/L (ref 38–126)
Anion gap: 6 (ref 5–15)
BUN: 31 mg/dL — ABNORMAL HIGH (ref 8–23)
CO2: 21 mmol/L — ABNORMAL LOW (ref 22–32)
Calcium: 9.5 mg/dL (ref 8.9–10.3)
Chloride: 112 mmol/L — ABNORMAL HIGH (ref 98–111)
Creatinine: 1.12 mg/dL — ABNORMAL HIGH (ref 0.44–1.00)
GFR, Estimated: 49 mL/min — ABNORMAL LOW (ref >60)
Glucose, Bld: 100 mg/dL — ABNORMAL HIGH (ref 70–99)
Potassium: 4.7 mmol/L (ref 3.5–5.1)
Sodium: 139 mmol/L (ref 135–145)
Total Bilirubin: 0.3 mg/dL (ref 0.3–1.2)
Total Protein: 7.2 g/dL (ref 6.5–8.1)

## 2021-09-25 LAB — CBC WITH DIFFERENTIAL (CANCER CENTER ONLY)
Abs Immature Granulocytes: 0.01 10*3/uL (ref 0.00–0.07)
Basophils Absolute: 0 10*3/uL (ref 0.0–0.1)
Basophils Relative: 1 %
Eosinophils Absolute: 0 10*3/uL (ref 0.0–0.5)
Eosinophils Relative: 2 %
HCT: 26.3 % — ABNORMAL LOW (ref 36.0–46.0)
Hemoglobin: 9.2 g/dL — ABNORMAL LOW (ref 12.0–15.0)
Immature Granulocytes: 0 %
Lymphocytes Relative: 24 %
Lymphs Abs: 0.5 10*3/uL — ABNORMAL LOW (ref 0.7–4.0)
MCH: 36.1 pg — ABNORMAL HIGH (ref 26.0–34.0)
MCHC: 35 g/dL (ref 30.0–36.0)
MCV: 103.1 fL — ABNORMAL HIGH (ref 80.0–100.0)
Monocytes Absolute: 0.3 10*3/uL (ref 0.1–1.0)
Monocytes Relative: 12 %
Neutro Abs: 1.4 10*3/uL — ABNORMAL LOW (ref 1.7–7.7)
Neutrophils Relative %: 61 %
Platelet Count: 127 10*3/uL — ABNORMAL LOW (ref 150–400)
RBC: 2.55 MIL/uL — ABNORMAL LOW (ref 3.87–5.11)
RDW: 14.6 % (ref 11.5–15.5)
Smear Review: NORMAL
WBC Count: 2.2 10*3/uL — ABNORMAL LOW (ref 4.0–10.5)
nRBC: 0 % (ref 0.0–0.2)

## 2021-09-25 LAB — MAGNESIUM: Magnesium: 1.2 mg/dL — ABNORMAL LOW (ref 1.7–2.4)

## 2021-09-25 MED ORDER — FULVESTRANT 250 MG/5ML IM SOSY
500.0000 mg | PREFILLED_SYRINGE | Freq: Once | INTRAMUSCULAR | Status: AC
  Administered 2021-09-25: 500 mg via INTRAMUSCULAR
  Filled 2021-09-25 (×2): qty 10

## 2021-09-26 LAB — CANCER ANTIGEN 27.29: CA 27.29: 40.6 U/mL — ABNORMAL HIGH (ref 0.0–38.6)

## 2021-10-08 ENCOUNTER — Encounter: Payer: Self-pay | Admitting: Adult Health

## 2021-10-08 DIAGNOSIS — Z4802 Encounter for removal of sutures: Secondary | ICD-10-CM | POA: Diagnosis not present

## 2021-10-08 DIAGNOSIS — C44619 Basal cell carcinoma of skin of left upper limb, including shoulder: Secondary | ICD-10-CM | POA: Diagnosis not present

## 2021-10-08 DIAGNOSIS — D0462 Carcinoma in situ of skin of left upper limb, including shoulder: Secondary | ICD-10-CM | POA: Diagnosis not present

## 2021-10-09 ENCOUNTER — Encounter: Payer: Self-pay | Admitting: Oncology

## 2021-10-09 ENCOUNTER — Other Ambulatory Visit: Payer: Self-pay | Admitting: *Deleted

## 2021-10-09 ENCOUNTER — Ambulatory Visit (HOSPITAL_COMMUNITY)
Admission: RE | Admit: 2021-10-09 | Discharge: 2021-10-09 | Disposition: A | Discharge status: Home / Self Care | Payer: Medicare Other | Source: Ambulatory Visit | Attending: Hematology and Oncology | Admitting: Hematology and Oncology

## 2021-10-09 DIAGNOSIS — C50919 Malignant neoplasm of unspecified site of unspecified female breast: Secondary | ICD-10-CM | POA: Diagnosis not present

## 2021-10-09 DIAGNOSIS — C7951 Secondary malignant neoplasm of bone: Secondary | ICD-10-CM | POA: Insufficient documentation

## 2021-10-09 LAB — GLUCOSE, CAPILLARY: Glucose-Capillary: 90 mg/dL (ref 70–99)

## 2021-10-09 MED ORDER — FLUDEOXYGLUCOSE F - 18 (FDG) INJECTION
7.1000 | Freq: Once | INTRAVENOUS | Status: AC | PRN
Start: 2021-10-09 — End: 2021-10-09
  Administered 2021-10-09: 7.1 via INTRAVENOUS

## 2021-10-09 MED ORDER — ANASTROZOLE 1 MG PO TABS: 1.0000 mg | ORAL_TABLET | Freq: Every day | ORAL | 0 refills | Status: DC

## 2021-10-23 ENCOUNTER — Encounter: Payer: Self-pay | Admitting: Hematology and Oncology

## 2021-10-23 ENCOUNTER — Inpatient Hospital Stay: Payer: Medicare Other | Attending: Adult Health

## 2021-10-23 ENCOUNTER — Inpatient Hospital Stay: Payer: Medicare Other

## 2021-10-23 ENCOUNTER — Inpatient Hospital Stay (HOSPITAL_BASED_OUTPATIENT_CLINIC_OR_DEPARTMENT_OTHER): Payer: Medicare Other | Admitting: Hematology and Oncology

## 2021-10-23 VITALS — BP 120/74 | HR 84 | Temp 98.3°F | Resp 18 | Ht 63.0 in | Wt 142.9 lb

## 2021-10-23 DIAGNOSIS — C7951 Secondary malignant neoplasm of bone: Secondary | ICD-10-CM | POA: Diagnosis not present

## 2021-10-23 DIAGNOSIS — Z170 Estrogen receptor positive status [ER+]: Secondary | ICD-10-CM

## 2021-10-23 DIAGNOSIS — E785 Hyperlipidemia, unspecified: Secondary | ICD-10-CM | POA: Insufficient documentation

## 2021-10-23 DIAGNOSIS — I890 Lymphedema, not elsewhere classified: Secondary | ICD-10-CM | POA: Insufficient documentation

## 2021-10-23 DIAGNOSIS — Z5111 Encounter for antineoplastic chemotherapy: Secondary | ICD-10-CM | POA: Diagnosis present

## 2021-10-23 DIAGNOSIS — M8580 Other specified disorders of bone density and structure, unspecified site: Secondary | ICD-10-CM | POA: Insufficient documentation

## 2021-10-23 DIAGNOSIS — Z79810 Long term (current) use of selective estrogen receptor modulators (SERMs): Secondary | ICD-10-CM | POA: Insufficient documentation

## 2021-10-23 DIAGNOSIS — R590 Localized enlarged lymph nodes: Secondary | ICD-10-CM | POA: Insufficient documentation

## 2021-10-23 DIAGNOSIS — C50211 Malignant neoplasm of upper-inner quadrant of right female breast: Secondary | ICD-10-CM | POA: Insufficient documentation

## 2021-10-23 DIAGNOSIS — I471 Supraventricular tachycardia: Secondary | ICD-10-CM | POA: Insufficient documentation

## 2021-10-23 DIAGNOSIS — Z803 Family history of malignant neoplasm of breast: Secondary | ICD-10-CM | POA: Diagnosis not present

## 2021-10-23 LAB — CBC WITH DIFFERENTIAL (CANCER CENTER ONLY)
Abs Immature Granulocytes: 0.01 10*3/uL (ref 0.00–0.07)
Basophils Absolute: 0 10*3/uL (ref 0.0–0.1)
Basophils Relative: 1 %
Eosinophils Absolute: 0 10*3/uL (ref 0.0–0.5)
Eosinophils Relative: 1 %
HCT: 28.5 % — ABNORMAL LOW (ref 36.0–46.0)
Hemoglobin: 9.9 g/dL — ABNORMAL LOW (ref 12.0–15.0)
Immature Granulocytes: 0 %
Lymphocytes Relative: 19 %
Lymphs Abs: 0.6 10*3/uL — ABNORMAL LOW (ref 0.7–4.0)
MCH: 36.3 pg — ABNORMAL HIGH (ref 26.0–34.0)
MCHC: 34.7 g/dL (ref 30.0–36.0)
MCV: 104.4 fL — ABNORMAL HIGH (ref 80.0–100.0)
Monocytes Absolute: 0.3 10*3/uL (ref 0.1–1.0)
Monocytes Relative: 9 %
Neutro Abs: 2 10*3/uL (ref 1.7–7.7)
Neutrophils Relative %: 70 %
Platelet Count: 131 10*3/uL — ABNORMAL LOW (ref 150–400)
RBC: 2.73 MIL/uL — ABNORMAL LOW (ref 3.87–5.11)
RDW: 15.1 % (ref 11.5–15.5)
WBC Count: 2.9 10*3/uL — ABNORMAL LOW (ref 4.0–10.5)
nRBC: 0 % (ref 0.0–0.2)

## 2021-10-23 LAB — CMP (CANCER CENTER ONLY)
ALT: 10 U/L (ref 0–44)
AST: 19 U/L (ref 15–41)
Albumin: 4.4 g/dL (ref 3.5–5.0)
Alkaline Phosphatase: 63 U/L (ref 38–126)
Anion gap: 11 (ref 5–15)
BUN: 27 mg/dL — ABNORMAL HIGH (ref 8–23)
CO2: 19 mmol/L — ABNORMAL LOW (ref 22–32)
Calcium: 9.4 mg/dL (ref 8.9–10.3)
Chloride: 109 mmol/L (ref 98–111)
Creatinine: 1.21 mg/dL — ABNORMAL HIGH (ref 0.44–1.00)
GFR, Estimated: 45 mL/min — ABNORMAL LOW (ref >60)
Glucose, Bld: 97 mg/dL (ref 70–99)
Potassium: 4.4 mmol/L (ref 3.5–5.1)
Sodium: 139 mmol/L (ref 135–145)
Total Bilirubin: 0.3 mg/dL (ref 0.3–1.2)
Total Protein: 7.9 g/dL (ref 6.5–8.1)

## 2021-10-23 LAB — MAGNESIUM: Magnesium: 1.3 mg/dL — ABNORMAL LOW (ref 1.7–2.4)

## 2021-10-23 MED ORDER — FULVESTRANT 250 MG/5ML IM SOSY
500.0000 mg | PREFILLED_SYRINGE | Freq: Once | INTRAMUSCULAR | Status: AC
  Administered 2021-10-23: 500 mg via INTRAMUSCULAR
  Filled 2021-10-23: qty 10

## 2021-10-23 NOTE — Progress Notes (Signed)
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   #2 status post 4 cycles of doxorubicin and cyclophosphamide,                          (i) followed by radiation therapy under the care of Dr. [name].   #3 received tamoxifen for a total of seven years    RIGHT BREAST #4  S/P biopsy of the RIGHT breast upper inner quadrant on 11/14/10 showing invasive ductal carcinoma,, grade 2, estrogen receptor 85% and progesterone receptor 57% positive, Ki67 20%, HER2 not amplified.     #5 started neoadjuvant letrozole in November 2012; switched to tamoxifen as of February 2016 due to osteopenia concerns   #6  S/P right lumpectomy with sentinel lymph node biopsy on 09/03/11 for a ypT2, ypN1a, stage IIB invasive lobular carcinoma, grade 2,estrogen receptor 97% positive, progesterone receptor 12% positive, with no HER-2 amplification.   #7 status post right breast radiation therapy under the care of Dr. [name] from 10/19/2011 to 12/03/2011.    #8 did not meet criteria for genetic testing according to her insurance company.   #9 osteopenia, with a T score of -1.8 on DEXA scan at [facility] 03/07/2013             (a) status post multiple dental extractions and implants             (b) repeat bone density at [facility] 04/02/2015 shows a T score of -2.0   METASTATIC DISEASE: April 2021 (lymph nodes, bone)   #10:  left upper extremity lymphedema led to chest CT scan 05/09/2019 showing a 5.1 cm left subpectoral chest wall mass and thoracic lymphadenopathy              (a) CT biopsy of the chest wall mass 05/25/2019 confirms recurrent breast cancer, strongly estrogen and  progesterone receptor positive, HER-2 not amplified             (b) PET scan 05/30/2019 shows a left subpectoral mass measuring 5.4 cm, with significant regional and mediastinal adenopathy, sclerotic left scapular metastasis, but no liver or lung involvement.             (c) CA 27-29 is moderately informative   #11 anastrozole started 06/02/2019; palbociclib added 06/08/2019             (a) palbociclib taken irregularly for several months secondary to cytopenias             (b) palbociclib dose decreased to 100 mg daily 21 on 7 off October 2021             (c) CT of the chest with contrast 11/09/2019 shows mild disease progression (despite a continuing drop in the CA 27-29)             (d) fulvestrant added 11/23/2019             (e) repeat chest CT with contrast 09/09/2020 read as stable.   #12 denosumab/Xgeva starting 06/08/2019, to be repeated every 3 months due to hypocalcemia             (a)  treatment held after August 2022 dose secondary to ongoing dental issues   #13 genetics testing 06/15/2019 through the [facility] Common Hereditary Cancers Panel found no deleterious mutations in APC, ATM, AXIN2, BARD1, BMPR1A, BRCA1, BRCA2, BRIP1, CDH1, CDKN2A (p14ARF), CDKN2A (p16INK4a), CKD4, CHEK2, CTNNA1, DICER1, EPCAM (Deletion/duplication testing only), GREM1 (promoter region deletion/duplication testing only), KIT, MEN1, MLH1, MSH2, MSH3, MSH6, MUTYH, NBN, NF1, NHTL1, PALB2, PDGFRA, PMS2, POLD1, POLE, PTEN, RAD50, RAD51C, RAD51D, RNF43, SDHB, SDHC, SDHD, SMAD4, SMARCA4. STK11, TP53, TSC1, TSC2, and VHL.  The following genes were evaluated for sequence changes only: SDHA and HOXB13 c.251G>A variant only.  CURRENT THERAPY: Anastrozole, Palbociclib, Fulvestrant  INTERVAL HISTORY:  Stacey [name] 81 y.o. female returns for follow-up follow-up of her metastatic breast cancer.   She is also taking palbociclib dosed at 100 mg 3 weeks on and 1 week off.  She is also on anastrozole and Faslodex for  antiestrogen therapy.  Overall she is tolerating this combination really well except for intermittent episodes of diarrhea.  She denies any other new concerns except for ongoing dental issues and loss of implants and resulting sinusitis which she deals from time to time.  She had gone to ENT, dissatisfied with the visit overall and was hoping to see another ENT doctor. No new bone pains.  She had questions about her PET/CT.  She has gone through it in detail and has had some notes written.  Rest of the pertinent 10 point ROS reviewed and negative  Patient Active Problem List   Diagnosis Date Noted   Acute upper respiratory infection 05/16/2021   Nasal dryness 02/04/2021   Chronic sinusitis 11/25/2020   Allergic rhinitis 07/08/2020   Hypomagnesemia 06/10/2020   Goals of care, counseling/discussion 06/21/2019   Family history of breast cancer    Family history of pancreatic cancer    Family history of uterine cancer    Family history of lymphoma    Family history of lung cancer    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[place]) 06/02/2019   Recurrent cancer of left breast ([place]) 05/15/2019   Pes anserine bursitis 03/31/2019   HTN (hypertension) 02/10/2019   Hyperglycemia 06/21/2018   Left carpal tunnel syndrome 02/22/2018   Rotator cuff arthropathy of left shoulder 09/20/2017   Arthritis of hand 08/23/2017   Pain in right hand 08/23/2017   Cervical radiculitis 04/09/2017   Left shoulder pain 04/09/2017   Dyspnea on exertion 05/14/2016   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   Lymphedema of left arm 01/14/2016   Left arm swelling 12/25/2015   SVT (supraventricular tachycardia) ([place])    Left-sided carotid artery disease ([place]) 03/08/2015   Dizziness 01/24/2015   Urinary frequency 01/13/2015   Dizziness and giddiness 01/09/2015   Gallstones 02/21/2013   Malignant neoplasm of upper-inner quadrant of left breast in female, estrogen receptor positive ([place]) 11/28/2012    Muscle cramping 09/12/2012   Right hip pain 09/12/2012   Lower back pain 09/12/2012   COPD GOLD I     Hx of radiation therapy    Right shoulder pain 09/14/2011   Preventative health care 07/29/2010   Skin lesion of left leg 07/29/2010   Paresthesia 07/29/2010   GERD 03/28/2010   CONSTIPATION 03/28/2010   Osteoarthrosis, 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  MEDICAL HISTORY: Past Medical History:  Diagnosis Date   ANEMIA-NOS    ANXIETY    Breast cancer ([place]) 1997 L, 2012 R   s/p chemo/xrt   COPD    resolved   DIVERTICULOSIS, COLON 2008   Dizziness    Family history of breast cancer    Family history of lung cancer    Family history of lymphoma    Family history of pancreatic cancer    Family history of uterine cancer    GERD    Hx of radiation therapy 10/19/11 -12/03/11   right breast   HYPERLIPIDEMIA    IRRITABLE BOWEL SYNDROME, HX OF    Left-sided carotid artery disease ([facility])    moderate left ICA stenosis   OSTEOARTHRITIS, HAND    PSVT (paroxysmal supraventricular tachycardia) ([place])    symptomatic on event monitor    SURGICAL HISTORY: Past Surgical History:  Procedure Laterality Date   ABDOMINAL HYSTERECTOMY     APPENDECTOMY     BREAST BIOPSY  11/14/10    r breast: inv, insitu mammary carcinoma w/calcif, er/pr +, her2 -   BREAST SURGERY     lumpectomy   CATARACT EXTRACTION     both eyes   ELECTROPHYSIOLOGIC STUDY N/A 05/10/2015   Procedure: SVT Ablation;  Surgeon: Will Meredith Leeds, MD;  Location: [facility];  Service: Cardiovascular;  Laterality: N/A;   HERNIA REPAIR     inguinal herniorrhapy  1984   left   IR US GUIDE BX ASP/DRAIN  05/26/2019   rectal fissure  repair     s/p benign breast biopsy  2003   right   s/p left foot surgury  2009   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   spiral fx left foot  2008   no surgury   TMJ ARTHROPLASTY  1989   TONGUE SURGERY     1988- to remove scar tissue growth    TONSILLECTOMY      SOCIAL HISTORY: Social History   Socioeconomic History   Marital status: Divorced    Spouse name: 2 Step-children   Number of children: 2   Years of education: Not on file   Highest education level: Not on file  Occupational History   Occupation: retired Banker  Tobacco Use   Smoking status: Former    Packs/day: 1.00    Years: 54.00    Total pack years: 54.00    Types: Cigarettes    Quit date: 01/03/2016    Years since quitting: 5.8   Smokeless tobacco: Never  Vaping Use   Vaping Use: Never used  Substance and Sexual Activity   Alcohol use: Yes    Comment: rare/ drinks socially   Drug use: No   Sexual activity: Never  Other Topics Concern   Not on file  Social History Narrative   Patient gets no regular exercise   No biological children   2 step children   Social Determinants of Health   Financial Resource Strain: Low Risk  (07/08/2020)   Overall Financial Resource Strain (CARDIA)    Difficulty of Paying Living Expenses: Not hard at all  Food Insecurity: No Food Insecurity (07/22/2021)   Hunger Vital Sign    Worried About Running Out of Food in the Last Year: Never true    Ran Out of Food in the Last Year: Never true  Transportation Needs: No Transportation Needs (07/22/2021)   PRAPARE - Transportation  Lack of Transportation (Medical): No    Lack of Transportation (Non-Medical): No  Physical Activity: Sufficiently Active (07/08/2020)   Exercise Vital Sign    Days of Exercise per Week: 5 days    Minutes of Exercise per Session: 30 min  Stress: No Stress Concern Present (07/08/2020)   [place]    Feeling of Stress : Not at all   Social Connections: Moderately Integrated (07/08/2020)   Social Connection and Isolation Panel [NHANES]    Frequency of Communication with Friends and Family: More than three times a week    Frequency of Social Gatherings with Friends and Family: More than three times a week    Attends Religious Services: More than 4 times per year    Active Member of [employer] or Organizations: No    Attends Music therapist: More than 4 times per year    Marital Status: Widowed  Intimate Partner Violence: Not At Risk (07/08/2020)   Humiliation, Afraid, Rape, and Kick questionnaire    Fear of Current or Ex-Partner: No    Emotionally Abused: No    Physically Abused: No    Sexually Abused: No    FAMILY HISTORY: Family History  Problem Relation Age of Onset   Hypertension Mother    Stroke Mother    Colon polyps Mother    Diabetes Mother    Pancreatic cancer Mother 73   Uterine cancer Mother 16   Lymphoma Brother        burkitts   Lung cancer Paternal Uncle    Lung cancer Maternal Grandmother 73       non-smoker   Lung cancer Maternal Grandfather    Breast cancer Cousin        maternal cousin, dx in her mid 24s   Brain cancer Cousin        maternal cousin's son; dx in his 32s   Testicular cancer Cousin        maternal cousin's son;    Breast cancer Cousin        paternal cousin; dx in her 38s   Breast cancer Cousin        paternal cousin's daughter; dx in 28s; neg genetic testing   Colon cancer Neg Hx    Esophageal cancer Neg Hx    Stomach cancer Neg Hx    Rectal cancer Neg Hx     Review of Systems  Constitutional:  Positive for fatigue. Negative for appetite change, chills, fever and unexpected weight change.  HENT:   Negative for hearing loss, lump/mass and trouble swallowing.   Eyes:  Negative for eye problems and icterus.  Respiratory:  Negative for chest tightness, cough and shortness of breath.   Cardiovascular:  Negative for chest pain, leg swelling and palpitations.   Gastrointestinal:  Negative for abdominal distention, abdominal pain, constipation, nausea and vomiting.  Endocrine: Negative for hot flashes.  Genitourinary:  Negative for difficulty urinating.   Musculoskeletal:  Positive for arthralgias.  Skin:  Negative for itching and rash.  Neurological:  Negative for dizziness, extremity weakness, headaches and numbness.  Hematological:  Negative for adenopathy. Does not bruise/bleed easily.  Psychiatric/Behavioral:  Negative for depression. The patient is not nervous/anxious.       PHYSICAL EXAMINATION  ECOG PERFORMANCE STATUS: 1 - Symptomatic but completely ambulatory  Vitals:   10/23/21 1443  BP: 120/74  Pulse: 84  Resp: 18  Temp: 98.3 F (36.8 C)  SpO2: 100%    Physical  Exam Constitutional:      General: She is not in acute distress.    Appearance: Normal appearance. She is not toxic-appearing.  HENT:     Head: Normocephalic and atraumatic.  Eyes:     General: No scleral icterus. Cardiovascular:     Rate and Rhythm: Normal rate and regular rhythm.     Pulses: Normal pulses.     Heart sounds: Normal heart sounds.  Pulmonary:     Effort: Pulmonary effort is normal.     Breath sounds: Normal breath sounds.  Abdominal:     General: Abdomen is flat. Bowel sounds are normal. There is no distension.     Palpations: Abdomen is soft.     Tenderness: There is no abdominal tenderness.  Musculoskeletal:        General: Swelling (Mild ankle swelling, right ankle just a bit more swollen than the left ankle.  Rest of the lower extremities appear symmetrical) present.     Cervical back: Neck supple.  Lymphadenopathy:     Cervical: No cervical adenopathy.  Skin:    General: Skin is warm and dry.     Findings: No rash.  Neurological:     General: No focal deficit present.     Mental Status: She is alert.  Psychiatric:        Mood and Affect: Mood normal.        Behavior: Behavior normal.     LABORATORY DATA: Appointment on  10/23/2021  Component Date Value Ref Range Status   Magnesium 10/23/2021 1.3 (L)  1.7 - 2.4 mg/dL Final   Performed at [facility], [address] [place], [place] [address]   Sodium 10/23/2021 139  135 - 145 mmol/L Final   Potassium 10/23/2021 4.4  3.5 - 5.1 mmol/L Final   Chloride 10/23/2021 109  98 - 111 mmol/L Final   CO2 10/23/2021 19 (L)  22 - 32 mmol/L Final   Glucose, Bld 10/23/2021 97  70 - 99 mg/dL Final   Glucose reference range applies only to samples taken after fasting for at least 8 hours.   BUN 10/23/2021 27 (H)  8 - 23 mg/dL Final   Creatinine 10/23/2021 1.21 (H)  0.44 - 1.00 mg/dL Final   Calcium 10/23/2021 9.4  8.9 - 10.3 mg/dL Final   Total Protein 10/23/2021 7.9  6.5 - 8.1 g/dL Final   Albumin 10/23/2021 4.4  3.5 - 5.0 g/dL Final   AST 10/23/2021 19  15 - 41 U/L Final   ALT 10/23/2021 10  0 - 44 U/L Final   Alkaline Phosphatase 10/23/2021 63  38 - 126 U/L Final   Total Bilirubin 10/23/2021 0.3  0.3 - 1.2 mg/dL Final   GFR, Estimated 10/23/2021 45 (L)  >60 mL/min Final   Comment: (NOTE) Calculated using the CKD-EPI Creatinine Equation (2021)    Anion gap 10/23/2021 11  5 - 15 Final   Performed at [facility], [place] [address], [place], [place] [address]   WBC Count 10/23/2021 2.9 (L)  4.0 - 10.5 K/uL Final   RBC 10/23/2021 2.73 (L)  3.87 - 5.11 MIL/uL Final   Hemoglobin 10/23/2021 9.9 (L)  12.0 - 15.0 g/dL Final   HCT 10/23/2021 28.5 (L)  36.0 - 46.0 % Final   MCV 10/23/2021 104.4 (H)  80.0 - 100.0 fL Final   MCH 10/23/2021 36.3 (H)  26.0 - 34.0 pg Final   MCHC 10/23/2021 34.7  30.0 - 36.0 g/dL Final  RDW 10/23/2021 15.1  11.5 - 15.5 % Final   Platelet Count 10/23/2021 131 (L)  150 - 400 K/uL Final   nRBC 10/23/2021 0.0  0.0 - 0.2 % Final   Neutrophils Relative % 10/23/2021 70  % Final   Neutro Abs 10/23/2021 2.0  1.7 - 7.7 K/uL Final   Lymphocytes Relative 10/23/2021 19  % Final   Lymphs Abs 10/23/2021 0.6  (L)  0.7 - 4.0 K/uL Final   Monocytes Relative 10/23/2021 9  % Final   Monocytes Absolute 10/23/2021 0.3  0.1 - 1.0 K/uL Final   Eosinophils Relative 10/23/2021 1  % Final   Eosinophils Absolute 10/23/2021 0.0  0.0 - 0.5 K/uL Final   Basophils Relative 10/23/2021 1  % Final   Basophils Absolute 10/23/2021 0.0  0.0 - 0.1 K/uL Final   Immature Granulocytes 10/23/2021 0  % Final   Abs Immature Granulocytes 10/23/2021 0.01  0.00 - 0.07 K/uL Final   Performed at [facility], [place] [address], [place], [place] [address]     ASSESSMENT and THERAPY PLAN:   This is a very pleasant 81 year old female patient with metastatic breast cancer currently on palbociclib, and anastrozole as well as fulvestrant since about May 2021 who is here for follow-up.   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  She however continues to respond very well to the current regimen.  We have reviewed the most recent PET imaging which looks stable, no new or recurrent disease.  She had several questions about the PET/CT including the air-fluid level in the maxillary sinus, aortic atherosclerosis and adrenal nodules.  We have gone over the PET imaging in detail.  She will continue the current regimen with Ibrance, Faslodex and anastrozole for antiestrogen therapy.  We will continue labs every 4 weeks given her severe hypomagnesemia and need for mag replacement.  Repeat imaging every 6 months unless clinically indicated sooner. Return to clinic in 8 weeks.  All questions were answered. The patient knows to call the clinic with any problems, questions or concerns. We can certainly see the patient much sooner if necessary.  Total encounter time:40 minutes*in face-to-face visit time, chart review, lab review, care coordination, order entry, and documentation of the encounter time.   *Total Encounter Time as defined by the Centers for Medicare and Medicaid Services includes, in addition to the  face-to-face time of a patient visit (documented in the note above) non-face-to-face time: obtaining and reviewing outside history, ordering and reviewing medications, tests or procedures, care coordination (communications with other health care professionals or caregivers) and documentation in the medical record.

## 2021-10-24 LAB — CANCER ANTIGEN 27.29: CA 27.29: 51.4 U/mL — ABNORMAL HIGH (ref 0.0–38.6)

## 2021-11-05 DIAGNOSIS — Z5189 Encounter for other specified aftercare: Secondary | ICD-10-CM | POA: Diagnosis not present

## 2021-11-05 DIAGNOSIS — I890 Lymphedema, not elsewhere classified: Secondary | ICD-10-CM | POA: Diagnosis not present

## 2021-11-10 ENCOUNTER — Encounter: Payer: Self-pay | Admitting: Hematology and Oncology

## 2021-11-20 ENCOUNTER — Other Ambulatory Visit: Payer: Self-pay | Admitting: *Deleted

## 2021-11-20 ENCOUNTER — Inpatient Hospital Stay (HOSPITAL_BASED_OUTPATIENT_CLINIC_OR_DEPARTMENT_OTHER): Payer: Medicare Other | Admitting: Hematology and Oncology

## 2021-11-20 ENCOUNTER — Encounter: Payer: Self-pay | Admitting: Hematology and Oncology

## 2021-11-20 ENCOUNTER — Inpatient Hospital Stay: Payer: Medicare Other

## 2021-11-20 ENCOUNTER — Inpatient Hospital Stay: Payer: Medicare Other | Attending: Adult Health

## 2021-11-20 VITALS — BP 134/55 | HR 96 | Temp 97.8°F | Resp 18 | Wt 144.1 lb

## 2021-11-20 DIAGNOSIS — C7951 Secondary malignant neoplasm of bone: Secondary | ICD-10-CM | POA: Diagnosis not present

## 2021-11-20 DIAGNOSIS — Z79811 Long term (current) use of aromatase inhibitors: Secondary | ICD-10-CM | POA: Insufficient documentation

## 2021-11-20 DIAGNOSIS — Z170 Estrogen receptor positive status [ER+]: Secondary | ICD-10-CM | POA: Diagnosis not present

## 2021-11-20 DIAGNOSIS — C779 Secondary and unspecified malignant neoplasm of lymph node, unspecified: Secondary | ICD-10-CM | POA: Diagnosis not present

## 2021-11-20 DIAGNOSIS — M879 Osteonecrosis, unspecified: Secondary | ICD-10-CM | POA: Diagnosis not present

## 2021-11-20 DIAGNOSIS — M8580 Other specified disorders of bone density and structure, unspecified site: Secondary | ICD-10-CM | POA: Insufficient documentation

## 2021-11-20 DIAGNOSIS — Z5111 Encounter for antineoplastic chemotherapy: Secondary | ICD-10-CM | POA: Diagnosis not present

## 2021-11-20 DIAGNOSIS — C50212 Malignant neoplasm of upper-inner quadrant of left female breast: Secondary | ICD-10-CM | POA: Insufficient documentation

## 2021-11-20 DIAGNOSIS — I890 Lymphedema, not elsewhere classified: Secondary | ICD-10-CM | POA: Diagnosis not present

## 2021-11-20 LAB — CMP (CANCER CENTER ONLY)
ALT: 9 U/L (ref 0–44)
AST: 18 U/L (ref 15–41)
Albumin: 4.1 g/dL (ref 3.5–5.0)
Alkaline Phosphatase: 71 U/L (ref 38–126)
Anion gap: 10 (ref 5–15)
BUN: 37 mg/dL — ABNORMAL HIGH (ref 8–23)
CO2: 20 mmol/L — ABNORMAL LOW (ref 22–32)
Calcium: 9.3 mg/dL (ref 8.9–10.3)
Chloride: 107 mmol/L (ref 98–111)
Creatinine: 1.2 mg/dL — ABNORMAL HIGH (ref 0.44–1.00)
GFR, Estimated: 45 mL/min — ABNORMAL LOW (ref >60)
Glucose, Bld: 106 mg/dL — ABNORMAL HIGH (ref 70–99)
Potassium: 4.4 mmol/L (ref 3.5–5.1)
Sodium: 137 mmol/L (ref 135–145)
Total Bilirubin: 0.3 mg/dL (ref 0.3–1.2)
Total Protein: 7.6 g/dL (ref 6.5–8.1)

## 2021-11-20 LAB — CBC WITH DIFFERENTIAL (CANCER CENTER ONLY)
Abs Immature Granulocytes: 0 10*3/uL (ref 0.00–0.07)
Basophils Absolute: 0 10*3/uL (ref 0.0–0.1)
Basophils Relative: 1 %
Eosinophils Absolute: 0.1 10*3/uL (ref 0.0–0.5)
Eosinophils Relative: 2 %
HCT: 25.2 % — ABNORMAL LOW (ref 36.0–46.0)
Hemoglobin: 8.7 g/dL — ABNORMAL LOW (ref 12.0–15.0)
Immature Granulocytes: 0 %
Lymphocytes Relative: 18 %
Lymphs Abs: 0.5 10*3/uL — ABNORMAL LOW (ref 0.7–4.0)
MCH: 36.1 pg — ABNORMAL HIGH (ref 26.0–34.0)
MCHC: 34.5 g/dL (ref 30.0–36.0)
MCV: 104.6 fL — ABNORMAL HIGH (ref 80.0–100.0)
Monocytes Absolute: 0.4 10*3/uL (ref 0.1–1.0)
Monocytes Relative: 16 %
Neutro Abs: 1.7 10*3/uL (ref 1.7–7.7)
Neutrophils Relative %: 63 %
Platelet Count: 128 10*3/uL — ABNORMAL LOW (ref 150–400)
RBC: 2.41 MIL/uL — ABNORMAL LOW (ref 3.87–5.11)
RDW: 14.5 % (ref 11.5–15.5)
WBC Count: 2.7 10*3/uL — ABNORMAL LOW (ref 4.0–10.5)
nRBC: 0 % (ref 0.0–0.2)

## 2021-11-20 LAB — MAGNESIUM: Magnesium: 1.3 mg/dL — ABNORMAL LOW (ref 1.7–2.4)

## 2021-11-20 MED ORDER — GABAPENTIN 100 MG PO CAPS: 100.0000 mg | ORAL_CAPSULE | Freq: Every day | ORAL | 0 refills | Status: DC

## 2021-11-20 MED ORDER — FULVESTRANT 250 MG/5ML IM SOSY
500.0000 mg | PREFILLED_SYRINGE | Freq: Once | INTRAMUSCULAR | Status: AC
  Administered 2021-11-20: 500 mg via INTRAMUSCULAR
  Filled 2021-11-20: qty 10

## 2021-11-20 NOTE — Progress Notes (Signed)
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   #2 status post 4 cycles of doxorubicin and cyclophosphamide,                          (i) followed by radiation therapy under the care of Dr. [name].   #3 received tamoxifen for a total of seven years    RIGHT BREAST #4  S/P biopsy of the RIGHT breast upper inner quadrant on 11/14/10 showing invasive ductal carcinoma,, grade 2, estrogen receptor 85% and progesterone receptor 57% positive, Ki67 20%, HER2 not amplified.     #5 started neoadjuvant letrozole in November 2012; switched to tamoxifen as of February 2016 due to osteopenia concerns   #6  S/P right lumpectomy with sentinel lymph node biopsy on 09/03/11 for a ypT2, ypN1a, stage IIB invasive lobular carcinoma, grade 2,estrogen receptor 97% positive, progesterone receptor 12% positive, with no HER-2 amplification.   #7 status post right breast radiation therapy under the care of Dr. [name] from 10/19/2011 to 12/03/2011.    #8 did not meet criteria for genetic testing according to her insurance company.   #9 osteopenia, with a T score of -1.8 on DEXA scan at [facility] 03/07/2013             (a) status post multiple dental extractions and implants             (b) repeat bone density at [facility] 04/02/2015 shows a T score of -2.0   METASTATIC DISEASE: April 2021 (lymph nodes, bone)   #10:  left upper extremity lymphedema led to chest CT scan 05/09/2019 showing a 5.1 cm left subpectoral chest wall mass and thoracic lymphadenopathy              (a) CT biopsy of the chest wall mass 05/25/2019 confirms recurrent breast cancer, strongly estrogen and  progesterone receptor positive, HER-2 not amplified             (b) PET scan 05/30/2019 shows a left subpectoral mass measuring 5.4 cm, with significant regional and mediastinal adenopathy, sclerotic left scapular metastasis, but no liver or lung involvement.             (c) CA 27-29 is moderately informative   #11 anastrozole started 06/02/2019; palbociclib added 06/08/2019             (a) palbociclib taken irregularly for several months secondary to cytopenias             (b) palbociclib dose decreased to 100 mg daily 21 on 7 off October 2021             (c) CT of the chest with contrast 11/09/2019 shows mild disease progression (despite a continuing drop in the CA 27-29)             (d) fulvestrant added 11/23/2019             (e) repeat chest CT with contrast 09/09/2020 read as stable.   #12 denosumab/Xgeva starting 06/08/2019, to be repeated every 3 months due to hypocalcemia             (a) treatment  held after August 2022 dose secondary to ongoing dental issues   #13 genetics testing 06/15/2019 through the [facility] Common Hereditary Cancers Panel found no deleterious mutations in APC, ATM, AXIN2, BARD1, BMPR1A, BRCA1, BRCA2, BRIP1, CDH1, CDKN2A (p14ARF), CDKN2A (p16INK4a), CKD4, CHEK2, CTNNA1, DICER1, EPCAM (Deletion/duplication testing only), GREM1 (promoter region deletion/duplication testing only), KIT, MEN1, MLH1, MSH2, MSH3, MSH6, MUTYH, NBN, NF1, NHTL1, PALB2, PDGFRA, PMS2, POLD1, POLE, PTEN, RAD50, RAD51C, RAD51D, RNF43, SDHB, SDHC, SDHD, SMAD4, SMARCA4. STK11, TP53, TSC1, TSC2, and VHL.  The following genes were evaluated for sequence changes only: SDHA and HOXB13 c.251G>A variant only.  CURRENT THERAPY: Anastrozole, Palbociclib, Fulvestrant  INTERVAL HISTORY:  Stacey [name] 81 y.o. female returns for follow-up follow-up of her metastatic breast cancer.   Since last visit, she tells me that she is here for some right hip pain.  She called and spoke to one of our nurses and they  suggested that she come a little early for the appointment so we can maybe accommodate to see her.  Unfortunately I did not get any message from our nurses regarding this.  However I am happy to help today.  Patient tells me that she may have had this pain for months although it may have gotten progressively worse that it has been bothering her.  She tells me that she is worried that this could indicate possible recurrence since in the past her cancer presented with lymphedema.  The pain, she describes it as a nerve pain, sometimes it radiates to the mid leg.  She does not know of a clear aggravating or provoking factor at this time.  She is otherwise taking her Ibrance, her anastrozole and Faslodex as recommended.  She wondered if this could be from the injections. Rest of the pertinent 10 point ROS reviewed and negative  Patient Active Problem List   Diagnosis Date Noted   Acute upper respiratory infection 05/16/2021   Nasal dryness 02/04/2021   Chronic sinusitis 11/25/2020   Allergic rhinitis 07/08/2020   Hypomagnesemia 06/10/2020   Goals of care, counseling/discussion 06/21/2019   Family history of breast cancer    Family history of pancreatic cancer    Family history of uterine cancer    Family history of lymphoma    Family history of lung cancer    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[place]) 06/02/2019   Recurrent cancer of left breast ([place]) 05/15/2019   Pes anserine bursitis 03/31/2019   HTN (hypertension) 02/10/2019   Hyperglycemia 06/21/2018   Left carpal tunnel syndrome 02/22/2018   Rotator cuff arthropathy of left shoulder 09/20/2017   Arthritis of hand 08/23/2017   Pain in right hand 08/23/2017   Cervical radiculitis 04/09/2017   Left shoulder pain 04/09/2017   Dyspnea on exertion 05/14/2016   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   Lymphedema of left arm 01/14/2016   Left arm swelling 12/25/2015   SVT (supraventricular tachycardia)    Left-sided  carotid artery disease ([place]) 03/08/2015   Dizziness 01/24/2015   Urinary frequency 01/13/2015   Dizziness and giddiness 01/09/2015   Gallstones 02/21/2013   Malignant neoplasm of upper-inner quadrant of left breast in female, estrogen receptor positive ([place]) 11/28/2012   Muscle cramping 09/12/2012   Right hip pain 09/12/2012   Lower back pain 09/12/2012   COPD GOLD I     Hx of radiation therapy    Right shoulder pain 09/14/2011   Preventative health care 07/29/2010   Skin lesion of left leg 07/29/2010  Paresthesia 07/29/2010   GERD 03/28/2010   CONSTIPATION 03/28/2010   Osteoarthrosis, 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  MEDICAL HISTORY: Past Medical History:  Diagnosis Date   ANEMIA-NOS    ANXIETY    Breast cancer ([facility]) 1997 L, 2012 R   s/p chemo/xrt   COPD    resolved   DIVERTICULOSIS, COLON 2008   Dizziness    Family history of breast cancer    Family history of lung cancer    Family history of lymphoma    Family history of pancreatic cancer    Family history of uterine cancer    GERD    Hx of radiation therapy 10/19/11 -12/03/11   right breast   HYPERLIPIDEMIA    IRRITABLE BOWEL SYNDROME, HX OF    Left-sided carotid artery disease (HCC)    moderate left ICA stenosis   OSTEOARTHRITIS, HAND    PSVT (paroxysmal supraventricular tachycardia) ([place])    symptomatic on event monitor    SURGICAL HISTORY: Past Surgical History:  Procedure Laterality Date   ABDOMINAL HYSTERECTOMY     APPENDECTOMY     BREAST BIOPSY  11/14/10    r breast: inv, insitu mammary carcinoma w/calcif, er/pr +, her2 -   BREAST SURGERY     lumpectomy   CATARACT EXTRACTION     both eyes   ELECTROPHYSIOLOGIC  STUDY N/A 05/10/2015   Procedure: SVT Ablation;  Surgeon: Will Meredith Leeds, MD;  Location: [facility];  Service: Cardiovascular;  Laterality: N/A;   HERNIA REPAIR     inguinal herniorrhapy  1984   left   IR US GUIDE BX ASP/DRAIN  05/26/2019   rectal fissure repair     s/p benign breast biopsy  2003   right   s/p left foot surgury  2009   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   spiral fx left foot  2008   no surgury   TMJ ARTHROPLASTY  1989   TONGUE SURGERY     1988- to remove scar tissue growth    TONSILLECTOMY      SOCIAL HISTORY: Social History   Socioeconomic History   Marital status: Divorced    Spouse name: 2 Step-children   Number of children: 2   Years of education: Not on file   Highest education level: Not on file  Occupational History   Occupation: retired Banker  Tobacco Use   Smoking status: Former    Packs/day: 1.00    Years: 54.00    Total pack years: 54.00    Types: Cigarettes    Quit date: 01/03/2016    Years since quitting: 5.8   Smokeless tobacco: Never  Vaping Use   Vaping Use: Never used  Substance and Sexual Activity   Alcohol use: Yes    Comment: rare/ drinks socially   Drug use: No   Sexual activity: Never  Other Topics Concern   Not on file  Social History Narrative   Patient gets no regular exercise   No biological children   2 step children   Social Determinants of Health   Financial Resource Strain: Low Risk  (07/08/2020)   Overall Financial Resource Strain (CARDIA)    Difficulty of Paying Living Expenses: Not  hard at all  Food Insecurity: No Food Insecurity (07/22/2021)   Hunger Vital Sign    Worried About Running Out of Food in the Last Year: Never true    Ran Out of Food in the Last Year: Never true  Transportation Needs: No Transportation Needs (07/22/2021)   PRAPARE - Hydrologist (Medical): No    Lack of Transportation (Non-Medical): No  Physical Activity: Sufficiently Active  (07/08/2020)   Exercise Vital Sign    Days of Exercise per Week: 5 days    Minutes of Exercise per Session: 30 min  Stress: No Stress Concern Present (07/08/2020)   [place]    Feeling of Stress : Not at all  Social Connections: Moderately Integrated (07/08/2020)   Social Connection and Isolation Panel [NHANES]    Frequency of Communication with Friends and Family: More than three times a week    Frequency of Social Gatherings with Friends and Family: More than three times a week    Attends Religious Services: More than 4 times per year    Active Member of [employer] or Organizations: No    Attends Music therapist: More than 4 times per year    Marital Status: Widowed  Intimate Partner Violence: Not At Risk (07/08/2020)   Humiliation, Afraid, Rape, and Kick questionnaire    Fear of Current or Ex-Partner: No    Emotionally Abused: No    Physically Abused: No    Sexually Abused: No    FAMILY HISTORY: Family History  Problem Relation Age of Onset   Hypertension Mother    Stroke Mother    Colon polyps Mother    Diabetes Mother    Pancreatic cancer Mother 30   Uterine cancer Mother 72   Lymphoma Brother        burkitts   Lung cancer Paternal Uncle    Lung cancer Maternal Grandmother 43       non-smoker   Lung cancer Maternal Grandfather    Breast cancer Cousin        maternal cousin, dx in her mid 22s   Brain cancer Cousin        maternal cousin's son; dx in his 22s   Testicular cancer Cousin        maternal cousin's son;    Breast cancer Cousin        paternal cousin; dx in her 40s   Breast cancer Cousin        paternal cousin's daughter; dx in 29s; neg genetic testing   Colon cancer Neg Hx    Esophageal cancer Neg Hx    Stomach cancer Neg Hx    Rectal cancer Neg Hx     Review of Systems  Constitutional:  Positive for fatigue. Negative for appetite change, chills, fever and unexpected weight  change.  HENT:   Negative for hearing loss, lump/mass and trouble swallowing.   Eyes:  Negative for eye problems and icterus.  Respiratory:  Negative for chest tightness, cough and shortness of breath.   Cardiovascular:  Negative for chest pain, leg swelling and palpitations.  Gastrointestinal:  Negative for abdominal distention, abdominal pain, constipation, nausea and vomiting.  Endocrine: Negative for hot flashes.  Genitourinary:  Negative for difficulty urinating.   Musculoskeletal:  Positive for arthralgias.       Pain in the right buttock  Skin:  Negative for itching and rash.  Neurological:  Negative for dizziness, extremity weakness, headaches  and numbness.  Hematological:  Negative for adenopathy. Does not bruise/bleed easily.  Psychiatric/Behavioral:  Negative for depression. The patient is not nervous/anxious.       PHYSICAL EXAMINATION  ECOG PERFORMANCE STATUS: 1 - Symptomatic but completely ambulatory  Vitals:   11/20/21 1437  BP: (!) 134/55  Pulse: 96  Resp: 18  Temp: 97.8 F (36.6 C)  SpO2: 100%    Focused physical exam conducted today.  She appears to be in no distress.   Pain palpable in the right ischial tuberosity or right below that.  LABORATORY DATA: Appointment on 11/20/2021  Component Date Value Ref Range Status   Magnesium 11/20/2021 1.3 (L)  1.7 - 2.4 mg/dL Final   Performed at [facility], [address], [place], [place] [address]   Sodium 11/20/2021 137  135 - 145 mmol/L Final   Potassium 11/20/2021 4.4  3.5 - 5.1 mmol/L Final   Chloride 11/20/2021 107  98 - 111 mmol/L Final   CO2 11/20/2021 20 (L)  22 - 32 mmol/L Final   Glucose, Bld 11/20/2021 106 (H)  70 - 99 mg/dL Final   Glucose reference range applies only to samples taken after fasting for at least 8 hours.   BUN 11/20/2021 37 (H)  8 - 23 mg/dL Final   Creatinine 11/20/2021 1.20 (H)  0.44 - 1.00 mg/dL Final   Calcium 11/20/2021 9.3  8.9 - 10.3 mg/dL Final    Total Protein 11/20/2021 7.6  6.5 - 8.1 g/dL Final   Albumin 11/20/2021 4.1  3.5 - 5.0 g/dL Final   AST 11/20/2021 18  15 - 41 U/L Final   ALT 11/20/2021 9  0 - 44 U/L Final   Alkaline Phosphatase 11/20/2021 71  38 - 126 U/L Final   Total Bilirubin 11/20/2021 0.3  0.3 - 1.2 mg/dL Final   GFR, Estimated 11/20/2021 45 (L)  >60 mL/min Final   Comment: (NOTE) Calculated using the CKD-EPI Creatinine Equation (2021)    Anion gap 11/20/2021 10  5 - 15 Final   Performed at [facility], [place] [address], [place], [place] [address]   WBC Count 11/20/2021 2.7 (L)  4.0 - 10.5 K/uL Final   RBC 11/20/2021 2.41 (L)  3.87 - 5.11 MIL/uL Final   Hemoglobin 11/20/2021 8.7 (L)  12.0 - 15.0 g/dL Final   HCT 11/20/2021 25.2 (L)  36.0 - 46.0 % Final   MCV 11/20/2021 104.6 (H)  80.0 - 100.0 fL Final   MCH 11/20/2021 36.1 (H)  26.0 - 34.0 pg Final   MCHC 11/20/2021 34.5  30.0 - 36.0 g/dL Final   RDW 11/20/2021 14.5  11.5 - 15.5 % Final   Platelet Count 11/20/2021 128 (L)  150 - 400 K/uL Final   nRBC 11/20/2021 0.0  0.0 - 0.2 % Final   Neutrophils Relative % 11/20/2021 63  % Final   Neutro Abs 11/20/2021 1.7  1.7 - 7.7 K/uL Final   Lymphocytes Relative 11/20/2021 18  % Final   Lymphs Abs 11/20/2021 0.5 (L)  0.7 - 4.0 K/uL Final   Monocytes Relative 11/20/2021 16  % Final   Monocytes Absolute 11/20/2021 0.4  0.1 - 1.0 K/uL Final   Eosinophils Relative 11/20/2021 2  % Final   Eosinophils Absolute 11/20/2021 0.1  0.0 - 0.5 K/uL Final   Basophils Relative 11/20/2021 1  % Final   Basophils Absolute 11/20/2021 0.0  0.0 - 0.1 K/uL Final   Immature Granulocytes 11/20/2021 0  %  Final   Abs Immature Granulocytes 11/20/2021 0.00  0.00 - 0.07 K/uL Final   Performed at [facility], [place] [address], [place], [place] [address]     ASSESSMENT and THERAPY PLAN:   This is a very pleasant 81 year old female patient with metastatic breast cancer currently on palbociclib,  and anastrozole as well as fulvestrant since about May 2021 who is here for follow-up.   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 Her last imaging in September did not show any new evidence of recurrence or metastatic disease.  She was understandably concerned about this pain in the right buttock.  This could very well be musculoskeletal or nerve pain.  We have discussed about trying a small dose of gabapentin in case this is neuropathic pain.  She was clearly instructed to pay attention to the aggravating and relieving factors.  We have discussed about further imaging although I have low suspicion for recurrence especially with recent PET that has no evidence of hypermetabolic activity in that region.  She is agreeable with this.  She will return to clinic in 28 days for her Faslodex injection and if she continues to have pain in that area at the same or worse severity, we can consider a CT bone. She continues on oral mag replacement.  She has severe hypomagnesemia at baseline.  She does not think we need to give her any IV magnesium replacement at this time.  All questions were answered. The patient knows to call the clinic with any problems, questions or concerns. We can certainly see the patient much sooner if necessary.  Total encounter time:30 minutes*in face-to-face visit time, chart review, lab review, care coordination, order entry, and documentation of the encounter time.   *Total Encounter Time as defined by the Centers for Medicare and Medicaid Services includes, in addition to the face-to-face time of a patient visit (documented in the note above) non-face-to-face time: obtaining and reviewing outside history, ordering and reviewing medications, tests or procedures, care coordination (communications with other health care professionals or caregivers) and documentation in the medical record.

## 2021-11-20 NOTE — Patient Instructions (Signed)

## 2021-11-21 LAB — CANCER ANTIGEN 27.29: CA 27.29: 48.9 U/mL — ABNORMAL HIGH (ref 0.0–38.6)

## 2021-11-23 NOTE — Therapy (Signed)
OUTPATIENT PHYSICAL THERAPY ONCOLOGY EVALUATION  Patient Name: Stacey [name] MRN: [id number] DOB:1940/08/12, 81 y.o., female Today's Date: 11/24/2021   PT End of Session - 11/24/21 1500     Visit Number 1    Number of Visits 18    Date for PT Re-Evaluation 01/05/22    PT Start Time 1406    PT Stop Time 1448    PT Time Calculation (min) 49 min    Activity Tolerance Patient tolerated treatment well    Behavior During Therapy [facility] for tasks assessed/performed             Past Medical History:  Diagnosis Date   ANEMIA-NOS    ANXIETY    Breast cancer ([place]) 1997 L, 2012 R   s/p chemo/xrt   COPD    resolved   DIVERTICULOSIS, COLON 2008   Dizziness    Family history of breast cancer    Family history of lung cancer    Family history of lymphoma    Family history of pancreatic cancer    Family history of uterine cancer    GERD    Hx of radiation therapy 10/19/11 -12/03/11   right breast   HYPERLIPIDEMIA    IRRITABLE BOWEL SYNDROME, HX OF    Left-sided carotid artery disease (HCC)    moderate left ICA stenosis   OSTEOARTHRITIS, HAND    PSVT (paroxysmal supraventricular tachycardia)    symptomatic on event monitor   Past Surgical History:  Procedure Laterality Date   ABDOMINAL HYSTERECTOMY     APPENDECTOMY     BREAST BIOPSY  11/14/10    r breast: inv, insitu mammary carcinoma w/calcif, er/pr +, her2 -   BREAST SURGERY     lumpectomy   CATARACT EXTRACTION     both eyes   ELECTROPHYSIOLOGIC STUDY N/A 05/10/2015   Procedure: SVT Ablation;  Surgeon: Will Meredith Leeds, MD;  Location: [place] [facility];  Service: Cardiovascular;  Laterality: N/A;   HERNIA REPAIR     inguinal herniorrhapy  1984   left   IR US GUIDE BX ASP/DRAIN  05/26/2019   rectal fissure repair     s/p benign breast biopsy  2003   right   s/p left foot surgury  2009   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   spiral fx left foot  2008   no surgury   TMJ ARTHROPLASTY  1989   TONGUE SURGERY      1988- to remove scar tissue growth    TONSILLECTOMY     Patient Active Problem List   Diagnosis Date Noted   Acute upper respiratory infection 05/16/2021   Nasal dryness 02/04/2021   Chronic sinusitis 11/25/2020   Allergic rhinitis 07/08/2020   Hypomagnesemia 06/10/2020   Goals of care, counseling/discussion 06/21/2019   Family history of breast cancer    Family history of pancreatic cancer    Family history of uterine cancer    Family history of lymphoma    Family history of lung cancer    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[place]) 06/02/2019   Recurrent cancer of left breast ([place]) 05/15/2019   Pes anserine bursitis 03/31/2019   HTN (hypertension) 02/10/2019   Hyperglycemia 06/21/2018   Left carpal tunnel syndrome 02/22/2018   Rotator cuff arthropathy of left shoulder 09/20/2017   Arthritis of hand 08/23/2017   Pain in right hand 08/23/2017   Cervical radiculitis 04/09/2017   Left shoulder pain 04/09/2017   Dyspnea on exertion 05/14/2016  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   Lymphedema of left arm 01/14/2016   Left arm swelling 12/25/2015   SVT (supraventricular tachycardia)    Left-sided carotid artery disease ([place]) 03/08/2015   Dizziness 01/24/2015   Urinary frequency 01/13/2015   Dizziness and giddiness 01/09/2015   Gallstones 02/21/2013   Malignant neoplasm of upper-inner quadrant of left breast in female, estrogen receptor positive ([place]) 11/28/2012   Muscle cramping 09/12/2012   Right hip pain 09/12/2012   Lower back pain 09/12/2012   COPD GOLD I     Hx of radiation therapy    Right shoulder pain 09/14/2011   Preventative health care 07/29/2010   Skin lesion of left leg 07/29/2010   Paresthesia 07/29/2010   GERD 03/28/2010   CONSTIPATION 03/28/2010   Osteoarthrosis, 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   Anxiety state 05/03/2009   Hyperlipidemia 06/14/2007   FATIGUE 06/14/2007   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  Rationale for Evaluation and Treatment Rehabilitation  SUBJECTIVE:                                                                                                                                                                                          OBBJECTIVE STATEMENT: Pt. Has been seen in the past for PT to treat left UE lymphedema. She has a compression sleeve that does not fit now and a tribute wrap for night time. Pt had 2 cancers removed from her left arm approximately 6 weeks ago. She has continued to have some drainage now in 1 small place and  she was told to keep vaseline and a bandaid on.  The other place is healed. I have a compression sleeve but it doesn't fit well. I wear the TG soft that I was given and the tribute every night. I can feel hot and cold, but otherwise have no feeling in my left hand. She has frequent diarrhea from her medications. She can't go anywhere until the afternoons because of this.  PERTINENT HISTORY:  Hx breast cancer with 15 lymph node removal on the L and Bil lumpectomy. Lumpectomy on the L in 1997and the R in 2012. Radiation Bil.  New discovery of metastatic breast Cancer presently on Palbocidib and Anastrozole,  Fulvestrant since 06/2019.Recent  PET scan on 10/09/2021 showed no evidence of recurrent or metastatic disease. Prior lymphedema treatment for Left UE in 2021  PAIN:  Are you having pain? Not presently, but at night it gets worse because it is more swollen and my wrist hurts. NPRS scale: 0-5/10 Pain location: wrist and hand on left  Pain orientation: Left  PAIN TYPE: sharp Pain  description: intermittent  Aggravating factors: evening Relieving factors: Tribute wrap but doesn't wear the hand piece.  PRECAUTIONS: Other: Left UE lymphedema , Metastatic breast Cancer, COPD  WEIGHT BEARING RESTRICTIONS: No  FALLS:  Has patient fallen in last 6 months? No  LIVING ENVIRONMENT: Lives with: lives alone Lives in: House/apartment Stairs: No; External: 1 steps; on right going up Has following equipment at home: None  OCCUPATION: retired  LEISURE: reading, puzzles, walks very little  HAND DOMINANCE: right   PRIOR LEVEL OF FUNCTION: Independent  PATIENT GOALS: Try and getting swelling in left arm down. Get a new compression sleeve that fits properly   OBJECTIVE:  COGNITION: Overall cognitive status: Within functional limits for tasks assessed   PALPATION: Mild pitting edema ulnar border of Left UE  OBSERVATIONS / OTHER ASSESSMENTS: Bandaid on forearm with small amount of clear  drainage showing through. Other aspect of incision that can be viewed is healed. Sensation to light touch in the hand is intact. Dorsum of hand is slightly swollen, and upper arm swollen above where she had TG soft folded over and with bottom folded over several inches, may be making hand swell. She complains of left wrist pain worse at night and has a weak left grip  SENSATION: Light touch: Appears intact throughout left UE   POSTURE: forward head, rounded shoulders  UPPER EXTREMITY AROM/PROM:  A/PROM RIGHT   eval   Shoulder extension   Shoulder flexion   Shoulder abduction   Shoulder internal rotation   Shoulder external rotation     (Blank rows = not tested)  A/PROM LEFT   eval  Shoulder extension   Shoulder flexion   Shoulder abduction   Shoulder internal rotation   Shoulder external rotation     (Blank rows = not tested)  CERVICAL AROM: All within functional limits:   UPPER EXTREMITY STRENGTH: NT  LYMPHEDEMA ASSESSMENTS:   SURGERY TYPE/DATE: Left breast  Lumpectomy 1997, Right lumpectomy 2012  NUMBER OF LYMPH NODES REMOVED: 15 on left UE  CHEMOTHERAPY:Yes, presently on medications for metastasis  RADIATION:bilateral   HORMONE TREATMENT: yes  INFECTIONS: yes  LYMPHEDEMA ASSESSMENTS:   LANDMARK RIGHT  eval  10 cm proximal to olecranon process 27.5  Olecranon process 22.4  10 cm proximal to ulnar styloid process 19.0  Just proximal to ulnar styloid process 15.2  Across hand at thumb web space 17.9  At base of 2nd digit 6.3  (Blank rows = not tested)  LANDMARK LEFT  eval  10 cm proximal to olecranon process 28.2  Olecranon process 27.5  10 cm proximal to ulnar styloid process 24.0  Just proximal to ulnar styloid process 18.2  Across hand at thumb web space 18.5  At base of 2nd digit 6.5  (Blank rows = not tested)      LLIS : 47%   TODAY'S TREATMENT:  DATE Discussed POC and wrapping with pt. Pt is agreeable to try wrapping for 2 weeks to see if her arm would reduce. Swelling is soft and with compliance should reduce so she can be fit for a new sleeve. Discussed caution folding over TG soft too much because of increasing the pressure possibly increasing swelling at hand and at upper arm.Marland Kitchen PATIENT EDUCATION:  Education details: discussed POC, bandaging, new garments, need for 3x/week, instruct family member for weekends if she would like. Person educated: Patient Education method: Explanation Education comprehension: verbalized understanding  HOME EXERCISE PROGRAM: None given today  ASSESSMENT:  CLINICAL IMPRESSION: Patient is a 81 y.o. female who was seen today for physical therapy evaluation and treatment for left UE lymphedema which seemed to worsen after having surgery to remove 2 skin cancers off of her left forearm approx 4-6 weeks ago. Pt with increased edema throughout the left UE especially  noted at elbow and 10 cm prox to ulna styloid process. She can no longer fit into her compression sleeve as it is too small in the forearm and too big in the upper arm. She was asked to bring her old sleeve in with her so we may see it. After discussing POC with her today she is agreeable to try wrapping and if no improvement in 2 weeks we will discontinue. Her swelling however is very soft with mild pitting and with compliance seems likely to improve..   OBJECTIVE IMPAIRMENTS: decreased activity tolerance, decreased knowledge of condition, decreased ROM, increased edema, impaired UE functional use, postural dysfunction, and pain.   ACTIVITY LIMITATIONS: lifting, dressing, and reach over head  PARTICIPATION LIMITATIONS: cleaning, yard work, and reaching activities  PERSONAL FACTORS: 3+ comorbidities: Breast Cancer s/p radiation and with metastasis, COPD, OA, current cancer meds  are also affecting patient's functional outcome.   REHAB POTENTIAL: Good  CLINICAL DECISION MAKING: Stable/uncomplicated  EVALUATION COMPLEXITY: Low  GOALS: Goals reviewed with patient? Yes  SHORT TERM GOALS: Target date: 12/15/2021    Pt will be tolerant of compression bandaging Baseline: Goal status: INITIAL  2.  Pt will have reduction at olecranon and 10 cm prox to ulna by 1.5 cm Baseline:  Goal status: INITIAL  LONG TERM GOALS: Target date: 01/05/2022    Pt will have decreased edema at olecranon and 10 cm prox to ulna styloid by 2.5 cm Baseline:  Goal status: INITIAL  2.  Pt will be fit for appropriate compression sleeve Baseline:  Goal status: INITIAL  3.  Pt will be independent with self care of lymphedema including day and night compression Baseline:  Goal status: INITIAL  4.  Pt will improve LLIS to no greater than 30% to demonstrate improved function  Baseline:  Goal status: INITIAL    PLAN:  PT FREQUENCY: 3x/week  PT DURATION: 6 weeks  PLANNED INTERVENTIONS: Therapeutic  exercises, Patient/Family education, Self Care, Orthotic/Fit training, Manual lymph drainage, Compression bandaging, and Manual therapy  PLAN FOR NEXT SESSION: check shoulder ROM? Make goal prn, initiate MLD, bandaging. Bandage 2 weeks and if not reducing discontinue, otherwise continue until plateau then fit for new garments.   Claris Pong, PT 11/24/2021, 3:50 PM

## 2021-11-24 ENCOUNTER — Ambulatory Visit: Payer: Medicare Other | Attending: Dermatology

## 2021-11-24 ENCOUNTER — Other Ambulatory Visit: Payer: Self-pay

## 2021-11-24 DIAGNOSIS — C44619 Basal cell carcinoma of skin of left upper limb, including shoulder: Secondary | ICD-10-CM | POA: Insufficient documentation

## 2021-11-24 DIAGNOSIS — Z853 Personal history of malignant neoplasm of breast: Secondary | ICD-10-CM | POA: Diagnosis not present

## 2021-11-24 DIAGNOSIS — I890 Lymphedema, not elsewhere classified: Secondary | ICD-10-CM | POA: Diagnosis not present

## 2021-11-24 DIAGNOSIS — C44629 Squamous cell carcinoma of skin of left upper limb, including shoulder: Secondary | ICD-10-CM | POA: Insufficient documentation

## 2021-11-25 ENCOUNTER — Ambulatory Visit: Payer: Medicare Other | Admitting: Internal Medicine

## 2021-11-26 DIAGNOSIS — Z23 Encounter for immunization: Secondary | ICD-10-CM | POA: Diagnosis not present

## 2021-12-01 ENCOUNTER — Ambulatory Visit (INDEPENDENT_AMBULATORY_CARE_PROVIDER_SITE_OTHER): Payer: Medicare Other | Admitting: Internal Medicine

## 2021-12-01 ENCOUNTER — Encounter: Payer: Self-pay | Admitting: Internal Medicine

## 2021-12-01 DIAGNOSIS — J449 Chronic obstructive pulmonary disease, unspecified: Secondary | ICD-10-CM

## 2021-12-01 MED ORDER — STIOLTO RESPIMAT 2.5-2.5 MCG/ACT IN AERS: 2.0000 | INHALATION_SPRAY | Freq: Every day | RESPIRATORY_TRACT | 11 refills | Status: DC

## 2021-12-01 NOTE — Patient Instructions (Addendum)
Try stop spiriva and start stiolto 2 puffs each am x 2 weeks to see if it improves your exercise tolerance better than spiriva and if not ok to stop it  Work on inhaler technique:  relax and gently blow all the way out then take a nice smooth full deep breath back in, triggering the inhaler at same time you start breathing in.  Hold breath in for at least  5 seconds if you can.  . Rinse and gargle with water when done.  If mouth or throat bother you at all,  try brushing teeth/gums/tongue with arm and hammer toothpaste/ make a slurry and gargle and spit out.   Pulmonary follow up is as needed

## 2021-12-01 NOTE — Patient Instructions (Signed)

## 2021-12-01 NOTE — Progress Notes (Unsigned)
Subjective:   Stacey [name] is a 81 y.o. female who presents for Medicare Annual (Subsequent) preventive examination. I connected with  [name] on 12/03/21 by a audio enabled telemedicine application and verified that I am speaking with the correct person using two identifiers.  Patient Location: Home  Provider Location: Home Office  I discussed the limitations of evaluation and management by telemedicine. The patient expressed understanding and agreed to proceed.  Review of Systems    Deferred to PCP Cardiac Risk Factors include: hypertension;advanced age (>69mn, >>2women)     Objective:    There were no vitals filed for this visit. There is no height or weight on file to calculate BMI.     12/02/2021    3:11 PM 11/24/2021    2:10 PM 08/28/2021    3:54 PM 09/05/2020    2:00 PM 07/08/2020    3:22 PM 08/02/2019    1:19 PM 07/06/2019    1:20 PM  Advanced Directives  Does Patient Have a Medical Advance Directive? _0  Yes Yes  Type of AP[place]amed[place]of [place] will  [place] will Living will;Healthcare Power of [place] will [place] will  Does patient want to make changes to medical advance directive? No - Patient declined No - Patient declined No - Patient declined No - Patient declined No - Patient declined  No - Patient declined  Copy of HWickliffein Chart? No - copy requested No - copy requested   No - copy requested    Would patient like information on creating a medical advance directive?      No - Patient declined No - Patient declined    Current Medications (verified) Outpatient Encounter Medications as of 12/02/2021  Medication Sig   acetaminophen (TYLENOL) 500 MG tablet Take 1,000 mg by mouth See admin instructions. Take 2 tablets (1000 mg) by mouth daily at bedtime, may also  take 2 tablets (1000 mg) two more times during the day or night as needed for pain   anastrozole (ARIMIDEX) 1 MG tablet Take 1 tablet (1 mg total) by mouth daily.   aspirin 81 MG EC tablet Take 81 mg by mouth every morning.   CALCIUM CARBONATE-VITAMIN D PO Take 1 tablet by mouth 2 (two) times daily.   Cholecalciferol (VITAMIN D3 PO) Take 1 capsule by mouth every morning.   clotrimazole-betamethasone (LOTRISONE) cream Apply 1 application topically 2 (two) times daily as needed. (Patient taking differently: Apply 1 application  topically 2 (two) times daily as needed (rash/yeast in corners of mouth).)   diphenhydrAMINE (BENADRYL) 25 MG tablet Take 25 mg by mouth every 6 (six) hours as needed (for bee stings).    fulvestrant (FASLODEX) 250 MG/5ML injection Inject 250 mg into the muscle every 28 (twenty-eight) days.   gabapentin (NEURONTIN) 100 MG capsule Take 1 capsule (100 mg total) by mouth at bedtime.   loperamide (IMODIUM) 2 MG capsule 2-4 mg See admin instructions. Take 2 tablets (4 mg) by mouth for first loose stool, then 1 tablet (2 mg) for every loose stool thereafter. Do not exceed 8 tablets (16 mg) in a 24 hour period. Stop loperamide if 12 hours have passed without a loose stool.   loratadine (CLARITIN) 10 MG tablet Take 10 mg by mouth every morning.   Magnesium 500 MG CAPS Take 500 mg by mouth every morning. Take with  a 250 mg capsule for a total dose of 750 mg daily   MAGNESIUM PO Take 250 mg by mouth every morning. Take with a 500 mg capsule for a total dose of 750 mg daily   Multiple Vitamin (MULTIVITAMIN WITH MINERALS) TABS tablet Take 1 tablet by mouth every morning. Centrum   omeprazole (PRILOSEC) 20 MG capsule Take 1 capsule by mouth daily   palbociclib (IBRANCE) 100 MG tablet Take 1 tablet (100 mg total) by mouth Monday through Friday for 3 weeks, then one week off, then repeat   prochlorperazine (COMPAZINE) 10 MG tablet Take 1 tablet (10 mg total) by mouth every 6 (six) hours as  needed for nausea or vomiting.   telmisartan (MICARDIS) 40 MG tablet Take 1 tablet (40 mg total) by mouth every morning.   Tiotropium Bromide-Olodaterol (STIOLTO RESPIMAT) 2.5-2.5 MCG/ACT AERS Inhale 2 puffs into the lungs daily.   triamcinolone (NASACORT) 55 MCG/ACT AERO nasal inhaler Place 2 sprays into the nose daily. 2 sprays each nostril at night before bedtime (Patient taking differently: Place 2 sprays into the nose See admin instructions. Instill 2 sprays into each nostril every night before bedtime)   triamterene-hydrochlorothiazide (DYAZIDE) 37.5-25 MG capsule TAKE 1 CAPSULE BY MOUTH EVERY MORNING (Patient taking differently: Take 1 capsule by mouth every morning.)   [DISCONTINUED] zolpidem (AMBIEN) 10 MG tablet TAKE 1 TABLET(10 MG) BY MOUTH AT BEDTIME AS NEEDED (Patient taking differently: Take 5 mg by mouth at bedtime.)   cholestyramine (QUESTRAN) 4 g packet MIX AND DRINK 1 PACKET(4 GRAMS) BY MOUTH THREE TIMES DAILY (Patient not taking: Reported on 12/02/2021)   No facility-administered encounter medications on file as of 12/02/2021.    Allergies (verified) Aminoglycosides, Bee venom, Clindamycin/lincomycin, and Codeine   History: Past Medical History:  Diagnosis Date   ANEMIA-NOS    ANXIETY    Breast cancer ([place]) 1997 L, 2012 R   s/p chemo/xrt   COPD    resolved   DIVERTICULOSIS, COLON 2008   Dizziness    Family history of breast cancer    Family history of lung cancer    Family history of lymphoma    Family history of pancreatic cancer    Family history of uterine cancer    GERD    Hx of radiation therapy 10/19/11 -12/03/11   right breast   HYPERLIPIDEMIA    IRRITABLE BOWEL SYNDROME, HX OF    Left-sided carotid artery disease (HCC)    moderate left ICA stenosis   OSTEOARTHRITIS, HAND    PSVT (paroxysmal supraventricular tachycardia)    symptomatic on event monitor   Past Surgical History:  Procedure Laterality Date   ABDOMINAL HYSTERECTOMY     APPENDECTOMY      BREAST BIOPSY  11/14/10    r breast: inv, insitu mammary carcinoma w/calcif, er/pr +, her2 -   BREAST SURGERY     lumpectomy   CATARACT EXTRACTION     both eyes   ELECTROPHYSIOLOGIC STUDY N/A 05/10/2015   Procedure: SVT Ablation;  Surgeon: Will Meredith Leeds, MD;  Location: [facility];  Service: Cardiovascular;  Laterality: N/A;   HERNIA REPAIR     inguinal herniorrhapy  1984   left   IR US GUIDE BX ASP/DRAIN  05/26/2019   rectal fissure repair     s/p benign breast biopsy  2003   right   s/p left foot surgury  2009   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   spiral fx left foot  2008   no surgury   TMJ ARTHROPLASTY  1989   TONGUE SURGERY     1988- to remove scar tissue growth    TONSILLECTOMY     Family History  Problem Relation Age of Onset   Hypertension Mother    Stroke Mother    Colon polyps Mother    Diabetes Mother    Pancreatic cancer Mother 38   Uterine cancer Mother 59   Lymphoma Brother        burkitts   Lung cancer Paternal Uncle    Lung cancer Maternal Grandmother 30       non-smoker   Lung cancer Maternal Grandfather    Breast cancer Cousin        maternal cousin, dx in her mid 4s   Brain cancer Cousin        maternal cousin's son; dx in his 48s   Testicular cancer Cousin        maternal cousin's son;    Breast cancer Cousin        paternal cousin; dx in her 37s   Breast cancer Cousin        paternal cousin's daughter; dx in 26s; neg genetic testing   Colon cancer Neg Hx    Esophageal cancer Neg Hx    Stomach cancer Neg Hx    Rectal cancer Neg Hx    Social History   Socioeconomic History   Marital status: Divorced    Spouse name: 2 Step-children   Number of children: 2   Years of education: Not on file   Highest education level: Not on file  Occupational History   Occupation: retired Banker  Tobacco Use   Smoking status: Former    Packs/day: 1.00    Years: 54.00    Total pack years: 54.00    Types: Cigarettes    Quit  date: 01/03/2016    Years since quitting: 5.9   Smokeless tobacco: Never  Vaping Use   Vaping Use: Never used  Substance and Sexual Activity   Alcohol use: Yes    Comment: rare/ drinks socially   Drug use: No   Sexual activity: Never  Other Topics Concern   Not on file  Social History Narrative   Patient gets no regular exercise   No biological children   2 step children   Social Determinants of Health   Financial Resource Strain: Low Risk  (12/02/2021)   Overall Financial Resource Strain (CARDIA)    Difficulty of Paying Living Expenses: Not hard at all  Food Insecurity: No Food Insecurity (12/02/2021)   Hunger Vital Sign    Worried About Running Out of Food in the Last Year: Never true    Ran Out of Food in the Last Year: Never true  Transportation Needs: No Transportation Needs (12/02/2021)   PRAPARE - Hydrologist (Medical): No    Lack of Transportation (Non-Medical): No  Physical Activity: Sufficiently Active (12/02/2021)   Exercise Vital Sign    Days of Exercise per Week: 5 days    Minutes of Exercise per Session: 30 min  Stress: No Stress Concern Present (12/02/2021)   [place]    Feeling of Stress : Only a little  Social Connections: Socially Isolated (12/02/2021)   Social Connection and Isolation Panel [NHANES]    Frequency of Communication with Friends and Family: More than three times a week    Frequency of Social Gatherings with Friends  and Family: More than three times a week    Attends Religious Services: Never    Active Member of Clubs or Organizations: No    Attends Music therapist: Never    Marital Status: Divorced    Tobacco Counseling Counseling given: Not Answered   Clinical Intake:  Pre-visit preparation completed: Yes  Pain : No/denies pain     Nutritional Risks: Nausea/ vomitting/ diarrhea, None (chronic diarrhea due to  chemotherapy drugs) Diabetes: No  How often do you need to have someone help you when you read instructions, pamphlets, or other written materials from your doctor or pharmacy?: 1 - Never What is the last grade level you completed in school?: college  Diabetic?No  Interpreter Needed?: No  Information entered by :: Taleisha Kaczynski RN   Activities of Daily Living    12/02/2021    3:05 PM  In your present state of health, do you have any difficulty performing the following activities:  Hearing? 0  Vision? 0  Difficulty concentrating or making decisions? 0  Walking or climbing stairs? 0  Dressing or bathing? 0  Doing errands, shopping? 0  Preparing Food and eating ? N  Using the Toilet? N  In the past six months, have you accidently leaked urine? N  Do you have problems with loss of bowel control? N  Managing your Medications? N  Managing your Finances? N  Housekeeping or managing your Housekeeping? N    Patient Care Team: Biagio Borg, MD as PCP - General (Internal Medicine) Tyler Pita, MD as Consulting Physician (Radiation Oncology) Roseanne Kaufman, MD as Consulting Physician (Orthopedic Surgery) Lyndal Pulley, DO as Consulting Physician (Family Medicine) Monna Fam, MD as Consulting Physician (Ophthalmology) Delice Bison, Charlestine Massed, NP as Nurse Practitioner (Hematology and Oncology)  Indicate any recent Medical Services you may have received from other than [facility] providers in the past year (date may be approximate).     Assessment:   This is a routine wellness examination for [facility].  Hearing/Vision screen No results found.  Dietary issues and exercise activities discussed: Current Exercise Habits: The patient does not participate in regular exercise at present, Exercise limited by: orthopedic condition(s)   Goals Addressed             This Visit's Progress    Patient Stated       Maintain my current health status.      Depression Screen     12/02/2021    3:18 PM 07/11/2021    3:39 PM 07/11/2021    3:17 PM 02/25/2021    2:00 PM 01/07/2021    3:54 PM 09/05/2020    2:00 PM 07/08/2020    3:42 PM  PHQ 2/9 Scores  PHQ - 2 Score 1 0 0 0 0 0 0  PHQ- 9 Score   3        Fall Risk    12/02/2021    3:09 PM 07/22/2021    3:00 PM 07/11/2021    3:38 PM 07/11/2021    3:17 PM 02/25/2021    2:00 PM  [place] in the past year? 1 0 0 0 0  Comment  continues to deny falls x 12 months; not currently using assistive devices     Number falls in past yr: 0 0 0 0 0  Injury with Fall? 0 0 0 0 0  Comment  N/A- no falls reported   N/A- no falls reported x 12 months  Risk for fall  due to : History of fall(s);Impaired balance/gait;Impaired mobility Medication side effect   No Fall Risks  Follow up Falls evaluation completed Falls prevention discussed   Falls prevention discussed  Comment     reports does not use assistive devices    FALL RISK PREVENTION PERTAINING TO THE HOME:  Any stairs in or around the home? Yes  If so, are there any without handrails? Yes  Home free of loose throw rugs in walkways, pet beds, electrical cords, etc? Yes  Adequate lighting in your home to reduce risk of falls? Yes   ASSISTIVE DEVICES UTILIZED TO PREVENT FALLS:  Life alert? No  Use of a cane, walker or w/c? No  Grab bars in the bathroom? Yes  Shower chair or bench in shower? No  Elevated toilet seat or a handicapped toilet? No   Cognitive Function:    12/02/2021    3:24 PM  MMSE - Mini Mental State Exam  Not completed: Refused        Immunizations Immunization History  Administered Date(s) Administered   Fluad Quad(high Dose 65+) 11/03/2018   Influenza Split 12/17/2011   Influenza Whole 12/17/2006, 10/23/2008, 10/18/2010   Influenza, High Dose Seasonal PF 11/16/2013, 11/26/2017   Influenza,inj,Quad PF,6+ Mos 11/19/2020   Influenza-Unspecified 11/02/2012, 11/17/2014, 11/05/2015, 11/03/2019   PFIZER(Purple Top)SARS-COV-2 Vaccination 02/27/2019,  03/27/2019, 09/20/2019, 08/15/2020, 11/19/2020   Pneumococcal Conjugate-13 12/15/2013   Pneumococcal Polysaccharide-23 01/20/2006   Td 06/14/2008   Tdap 02/10/2019    TDAP status: Up to date  Flu Vaccine status: Due, Education has been provided regarding the importance of this vaccine. Advised may receive this vaccine at local pharmacy or Health Dept. Aware to provide a copy of the vaccination record if obtained from local pharmacy or Health Dept. Verbalized acceptance and understanding.  Pneumococcal vaccine status: Up to date  Covid-19 vaccine status: Information provided on how to obtain vaccines.   Qualifies for Shingles Vaccine? Yes   Zostavax completed No   Shingrix Completed?: No.    Education has been provided regarding the importance of this vaccine. Patient has been advised to call insurance company to determine out of pocket expense if they have not yet received this vaccine. Advised may also receive vaccine at local pharmacy or Health Dept. Verbalized acceptance and understanding.  Screening Tests Health Maintenance  Topic Date Due   Zoster Vaccines- Shingrix (1 of 2) Never done   COVID-19 Vaccine (6 - Pfizer risk series) 01/14/2021   INFLUENZA VACCINE  05/03/2022 (Originally 09/02/2021)   Medicare Annual Wellness (AWV)  12/03/2022   TETANUS/TDAP  02/09/2029   Pneumonia Vaccine 19+ Years old  Completed   DEXA SCAN  Completed   HPV VACCINES  Aged Out    Health Maintenance  Health Maintenance Due  Topic Date Due   Zoster Vaccines- Shingrix (1 of 2) Never done   COVID-19 Vaccine (6 - Pfizer risk series) 01/14/2021    Colorectal cancer screening: No longer required.   Mammogram status: No longer required due to age.  Bone Density status: Completed 04/02/15. Results reflect: Bone density results: OSTEOPENIA. Repeat every 2 years.  Lung Cancer Screening: (Low Dose CT Chest recommended if Age 73-80 years, 30 pack-year currently smoking OR have quit w/in 15years.) does  not qualify.   Additional Screening:  Hepatitis C Screening: does qualify; Completed education provied  Vision Screening: Recommended annual ophthalmology exams for early detection of glaucoma and other disorders of the eye. Is the patient up to date with their annual eye exam?  Yes  Who is the provider or what is the name of the office in which the patient attends annual eye exams? Dr. Herbert Deaner If pt is not established with a provider, would they like to be referred to a provider to establish care?  N/A .   Dental Screening: Recommended annual dental exams for proper oral hygiene  Community Resource Referral / Chronic Care Management: CRR required this visit?  No   CCM required this visit?  No      Plan:     I have personally reviewed and noted the following in the patient's chart:   Medical and social history Use of alcohol, tobacco or illicit drugs  Current medications and supplements including opioid prescriptions. Patient is not currently taking opioid prescriptions. Functional ability and status Nutritional status Physical activity Advanced directives List of other physicians Hospitalizations, surgeries, and ER visits in previous 12 months Vitals Screenings to include cognitive, depression, and falls Referrals and appointments  In addition, I have reviewed and discussed with patient certain preventive protocols, quality metrics, and best practice recommendations. A written personalized care plan for preventive services as well as general preventive health recommendations were provided to patient.     Michiel Cowboy, RN   12/03/2021   Nurse Notes:  Ms. Mault , Thank you for taking time to come for your Medicare Wellness Visit. I appreciate your ongoing commitment to your health goals. Please review the following plan we discussed and let me know if I can assist you in the future.   These are the goals we discussed:  Goals      Client understands the importance of  follow-up with providers by attending scheduled visits     Patient Stated     Would like to find a way to get rid of Lymphedema.     Patient Stated     I want to get back outside so that I can walk and be more active.     Patient Stated     Maintain my current health status.        This is a list of the screening recommended for you and due dates:  Health Maintenance  Topic Date Due   Zoster (Shingles) Vaccine (1 of 2) Never done   COVID-19 Vaccine (6 - Pfizer risk series) 01/14/2021   Flu Shot  05/03/2022*   Medicare Annual Wellness Visit  12/03/2022   Tetanus Vaccine  02/09/2029   Pneumonia Vaccine  Completed   DEXA scan (bone density measurement)  Completed   HPV Vaccine  Aged Out  *Topic was postponed. The date shown is not the original due date.

## 2021-12-01 NOTE — Progress Notes (Unsigned)
Subjective:     Patient ID: Stacey [name], female   DOB: 1940/02/21,     MRN: [id number]    Brief patient profile:  10 yowf stopped smoking 01/2016  S/p RT to both breasts last RT Oct 2012 and seemed to affect breathing/ cough and then worse summer 2017 and quit smoking the cough resolved but doe stayed same and so pfts done c/w GOLD I copd 06/22/16  So pt referred to pulmonary clinic by Stacey [name] and first seen 08/27/2016    History of Present Illness  08/27/2016 1st Stacey [name]/ Stacey [name]   Chief Complaint  Patient presents with   Pulmonary Consult    Referred by Stacey [name] for eval of abnormal PFT. Pt c/o SOB for the past year, progressively getting worse. She states she tires very easily and her exercise tolerance has gone downhill.  She has rx for albuterol inhaler, but never had it filled.   indolent onset doe x 2012 worse since summer 2017 but since then about the same  Doe MMRC1 = can walk nl pace, flat grade, can't hurry or go uphills or steps s sob   Was walking 18 min / mile prior to 23 min mile now  Taking prilosec at least one daily right before bfast for overt HB Was prescribed inhalers but never filled rx or tried one of any kind rec Spiriva 2 puffs each am x 2 week sample and if your ex tolerance is better, stay on it indefinitely  Work on inhaler technique:   You have mild copd (GOLD I)      11/27/2019  f/u ov/Stacey [name] re: GOLD I   On spiriva rx for met breast ca > fatigue more than limiting sob Chief Complaint  Patient presents with   Follow-up    Denies any problems with breathing  Dyspnea:  Still MMRC2 = can't walk a nl pace on a flat grade s sob but does fine slow and flat  Cough: none  Sleeping: flat bed one pillow  SABA use: none  02: none  Rec Try anoro one click each(turn the number opposite where you can't see the number) - take two good drags from one click - rinse and gargle after      12/01/2021  yearly  f/u ov/Stacey [name] re: GOLD 1   maint on  spiriva  not sure she can tell it's helping  No chief complaint on file. Dyspnea:  avg pace and problems hills =  MMRC1 = can walk nl pace, flat grade, can't hurry or go uphills or steps s sob   Cough: none  Sleeping: prone/ flat bed  SABA use: none  02: none    No obvious day to day or daytime variability or assoc excess/ purulent sputum or mucus plugs or hemoptysis or cp or chest tightness, subjective wheeze or overt sinus or hb symptoms.   Sleeping  without nocturnal  or early am exacerbation  of respiratory  c/o's or need for noct saba. Also denies any obvious fluctuation of symptoms with weather or environmental changes or other aggravating or alleviating factors except as outlined above   No unusual exposure hx or h/o childhood pna/ asthma or knowledge of premature birth.  Current Allergies, Complete Past Medical History, Past Surgical History, Family History, and Social History were reviewed in [employer] record.  ROS  The following are not active complaints unless bolded Hoarseness, sore throat, dysphagia, dental problems, itching, sneezing,  nasal congestion  or discharge of excess mucus or purulent secretions, ear ache,   fever, chills, sweats, unintended wt loss or wt gain, classically pleuritic or exertional cp,  orthopnea pnd or arm/hand swelling  or leg swelling, presyncope, palpitations, abdominal pain, anorexia, nausea, vomiting, diarrhea  or change in bowel habits or change in bladder habits, change in stools or change in urine, dysuria, hematuria,  rash, arthralgias, visual complaints, headache, numbness, weakness or ataxia or problems with walking or coordination,  change in mood or  memory.        Current Meds  Medication Sig   acetaminophen (TYLENOL) 500 MG tablet Take 1,000 mg by mouth See admin instructions. Take 2 tablets (1000 mg) by mouth daily at bedtime, may also take 2 tablets (1000 mg) two more times during the day or night as needed for pain    anastrozole (ARIMIDEX) 1 MG tablet Take 1 tablet (1 mg total) by mouth daily.   aspirin 81 MG EC tablet Take 81 mg by mouth every morning.   CALCIUM CARBONATE-VITAMIN D PO Take 1 tablet by mouth 2 (two) times daily.   Cholecalciferol (VITAMIN D3 PO) Take 1 capsule by mouth every morning.   cholestyramine (QUESTRAN) 4 g packet MIX AND DRINK 1 PACKET(4 GRAMS) BY MOUTH THREE TIMES DAILY (Patient taking differently: Take 4 g by mouth every morning.)   clotrimazole-betamethasone (LOTRISONE) cream Apply 1 application topically 2 (two) times daily as needed. (Patient taking differently: Apply 1 application  topically 2 (two) times daily as needed (rash/yeast in corners of mouth).)   diphenhydrAMINE (BENADRYL) 25 MG tablet Take 25 mg by mouth every 6 (six) hours as needed (for bee stings).    fulvestrant (FASLODEX) 250 MG/5ML injection Inject 250 mg into the muscle every 28 (twenty-eight) days.   gabapentin (NEURONTIN) 100 MG capsule Take 1 capsule (100 mg total) by mouth at bedtime.   loperamide (IMODIUM) 2 MG capsule 2-4 mg See admin instructions. Take 2 tablets (4 mg) by mouth for first loose stool, then 1 tablet (2 mg) for every loose stool thereafter. Do not exceed 8 tablets (16 mg) in a 24 hour period. Stop loperamide if 12 hours have passed without a loose stool.   loratadine (CLARITIN) 10 MG tablet Take 10 mg by mouth every morning.   Magnesium 500 MG CAPS Take 500 mg by mouth every morning. Take with a 250 mg capsule for a total dose of 750 mg daily   MAGNESIUM PO Take 250 mg by mouth every morning. Take with a 500 mg capsule for a total dose of 750 mg daily   Multiple Vitamin (MULTIVITAMIN WITH MINERALS) TABS tablet Take 1 tablet by mouth every morning. Centrum   omeprazole (PRILOSEC) 20 MG capsule Take 1 capsule by mouth daily   palbociclib (IBRANCE) 100 MG tablet Take 1 tablet (100 mg total) by mouth Monday through Friday for 3 weeks, then one week off, then repeat   prochlorperazine  (COMPAZINE) 10 MG tablet Take 1 tablet (10 mg total) by mouth every 6 (six) hours as needed for nausea or vomiting.   SPIRIVA RESPIMAT 2.5 MCG/ACT AERS INHALE 2 PUFFS INTO THE LUNGS DAILY (Patient taking differently: Inhale 2 puffs into the lungs every morning.)   telmisartan (MICARDIS) 40 MG tablet Take 1 tablet (40 mg total) by mouth every morning.   triamcinolone (NASACORT) 55 MCG/ACT AERO nasal inhaler Place 2 sprays into the nose daily. 2 sprays each nostril at night before bedtime (Patient taking differently: Place 2 sprays into the nose  See admin instructions. Instill 2 sprays into each nostril every night before bedtime)   triamterene-hydrochlorothiazide (DYAZIDE) 37.5-25 MG capsule TAKE 1 CAPSULE BY MOUTH EVERY MORNING (Patient taking differently: Take 1 capsule by mouth every morning.)   zolpidem (AMBIEN) 10 MG tablet TAKE 1 TABLET(10 MG) BY MOUTH AT BEDTIME AS NEEDED (Patient taking differently: Take 5 mg by mouth at bedtime.)                       Objective:   Physical Exam   wts    12/01/2021       142  11/25/2020      146   11/27/2019      163  12/02/2018      170 12/01/2017      171   08/27/16 157 lb (71.2 kg)  05/14/16 163 lb (73.9 kg)  01/14/16 160 lb (72.6 kg)    Vital signs reviewed  12/01/2021  - Note at rest 02 sats  97% on RA   General appearance:    pleasant amb wf nad    HEENT : Oropharynx  clear  Nasal turbinates nl    NECK :  without  apparent JVD/ palpable Nodes/TM    LUNGS: no acc muscle use,  Min barrel  contour chest wall with bilateral  slightly decreased bs s audible wheeze and  without cough on insp or exp maneuvers and min  Hyperresonant  to  percussion bilaterally    CV:  RRR  no s3 or murmur or increase in P2, and no edema   ABD:  soft and nontender with pos end  insp Hoover's  in the supine position.  No bruits or organomegaly appreciated   MS:  Nl gait/ ext warm without deformities Or obvious joint restrictions  calf tenderness,  cyanosis or clubbing     SKIN: warm and dry without lesions    NEURO:  alert, approp, nl sensorium with  no motor or cerebellar deficits apparent.              I personally reviewed images and agree with radiology impression as follows:   Chest CT  06/16/21 Stable examination demonstrating chronic postradiation changes in the right lung and what appears to be a chronic indolent atypical infectious process such as MAI  but no definitive evidence to suggest metastatic disease in the thorax. Mild diffuse bronchial wall thickening with mild centrilobular and paraseptal emphysema; imaging findings suggestive of underlying COPD.    Assessment:

## 2021-12-02 ENCOUNTER — Ambulatory Visit (INDEPENDENT_AMBULATORY_CARE_PROVIDER_SITE_OTHER): Payer: Medicare Other | Admitting: *Deleted

## 2021-12-02 ENCOUNTER — Telehealth: Payer: Self-pay | Admitting: Pharmacy Technician

## 2021-12-02 DIAGNOSIS — Z0000 Encounter for general adult medical examination without abnormal findings: Secondary | ICD-10-CM | POA: Diagnosis not present

## 2021-12-02 NOTE — Telephone Encounter (Signed)
Oral Oncology Patient Advocate Encounter   Received notification that patient is due for re-enrollment for assistance for [employer] through [employer].   Re-enrollment process has been initiated and will be submitted upon completion of necessary documents.  Patient agreed to sign forms on 12/19/21 at their appointment  [facility] phone number [phone].   I will continue to follow until final determination.  Lady Deutscher, CPhT-Adv Oncology Pharmacy Patient [place] Direct Number: [phone]  Fax: [phone]

## 2021-12-03 ENCOUNTER — Encounter: Payer: Self-pay | Admitting: Oncology

## 2021-12-03 ENCOUNTER — Encounter: Payer: Self-pay | Admitting: Internal Medicine

## 2021-12-03 ENCOUNTER — Other Ambulatory Visit: Payer: Self-pay | Admitting: Internal Medicine

## 2021-12-03 NOTE — Assessment & Plan Note (Addendum)
Quit smoking 01/2016 - PFT's  08/27/2016  FEV1 1.83 (86 % ) ratio 63  p 2 % improvement from saba p nothing prior to study with DLCO  43/45c % corrects to 60  % for alv volume  - 08/27/2016   Walked RA  2 laps @ 185 ft each stopped due to legs gave out walking fast pace, no sob or desat  - 08/27/2016  After extensive coaching device  effectiveness =    75% > try spiriva respimat 2.5 mg x 2 pffs each am - 12/02/2018  C/o dry mouth so rec taper off spiriva to see if affects doe or mouth and if can tolerate one puff spiriva  (but not zero due to doe)  then options are one puff spiriva or one stiolto daily - 11/27/2019  After extensive coaching inhaler device,  effectiveness =    90% with elipta so try anoro one click each am due to mmrc2 doe and cost of spiriva  > preferred spiriva respimat - 12/01/2021  After extensive coaching inhaler device,  effectiveness =    90% with smi > try stiolto 2 each am and if no change in ex tol   then d/c and see what absence of rx does to above indicators   It may be that her airflow obst is really not her limiting factor and not preventing aecopd so ok to try off since concerned with cost  - advised:  > 3 min discussion I reviewed the Fletcher curve with the patient that basically indicates  if you quit smoking when your best day FEV1 is still well preserved (as is clearly  the case here)  it is highly unlikely you will progress to severe disease and informed the patient there was  no medication on the market that has proven to alter the curve/ its downward trajectory  or the likelihood of progression of their disease(unlike other chronic medical conditions such as atheroclerosis where we do think we can change the natural hx with risk reducing meds)    Therefore   maintaining abstinence from cigarettes is   the most important aspects of her care, not choice of inhalers or for that matter, pulmonarhy doctors.   Treatment other than smoking cessation  is entirely directed by  severity of symptoms and focused also on reducing exacerbations, not attempting to change the natural history of the disease.    F/u in pulmonary clinic  can be prn   Each maintenance medication was reviewed in detail including emphasizing most importantly the difference between maintenance and prns and under what circumstances the prns are to be triggered using an action plan format where appropriate.  Total time for H and P, chart review, counseling, reviewing smidevice(s) and generating customized AVS unique to this office visit / same day charting = 30 min / summary final f/u ov with contingencies reviewewed

## 2021-12-09 ENCOUNTER — Ambulatory Visit: Payer: Medicare Other | Attending: Dermatology | Admitting: Physical Therapy

## 2021-12-09 ENCOUNTER — Encounter: Payer: Self-pay | Admitting: Physical Therapy

## 2021-12-09 DIAGNOSIS — C44619 Basal cell carcinoma of skin of left upper limb, including shoulder: Secondary | ICD-10-CM | POA: Diagnosis not present

## 2021-12-09 DIAGNOSIS — C44629 Squamous cell carcinoma of skin of left upper limb, including shoulder: Secondary | ICD-10-CM | POA: Insufficient documentation

## 2021-12-09 DIAGNOSIS — I890 Lymphedema, not elsewhere classified: Secondary | ICD-10-CM | POA: Diagnosis not present

## 2021-12-09 NOTE — Therapy (Signed)
OUTPATIENT PHYSICAL THERAPY ONCOLOGY TREATMENT  Patient Name: Stacey [name] MRN: [id number] DOB:01/24/1941, 81 y.o., female Today's Date: 12/09/2021   PT End of Session - 12/09/21 1404     Visit Number 2    Number of Visits 18    Date for PT Re-Evaluation 01/05/22    PT Start Time 1404    PT Stop Time 1457    PT Time Calculation (min) 53 min    Activity Tolerance Patient tolerated treatment well    Behavior During Therapy WFL for tasks assessed/performed             Past Medical History:  Diagnosis Date   ANEMIA-NOS    ANXIETY    Breast cancer (HCC) 1997 L, 2012 R   s/p chemo/xrt   COPD    resolved   DIVERTICULOSIS, COLON 2008   Dizziness    Family history of breast cancer    Family history of lung cancer    Family history of lymphoma    Family history of pancreatic cancer    Family history of uterine cancer    GERD    Hx of radiation therapy 10/19/11 -12/03/11   right breast   HYPERLIPIDEMIA    IRRITABLE BOWEL SYNDROME, HX OF    Left-sided carotid artery disease (HCC)    moderate left ICA stenosis   OSTEOARTHRITIS, HAND    PSVT (paroxysmal supraventricular tachycardia)    symptomatic on event monitor   Past Surgical History:  Procedure Laterality Date   ABDOMINAL HYSTERECTOMY     APPENDECTOMY     BREAST BIOPSY  11/14/10    r breast: inv, insitu mammary carcinoma w/calcif, er/pr +, her2 -   BREAST SURGERY     lumpectomy   CATARACT EXTRACTION     both eyes   ELECTROPHYSIOLOGIC STUDY N/A 05/10/2015   Procedure: SVT Ablation;  Surgeon: Will Martin Camnitz, MD;  Location: [facility];  Service: Cardiovascular;  Laterality: N/A;   HERNIA REPAIR     inguinal herniorrhapy  1984   left   IR US GUIDE BX ASP/DRAIN  05/26/2019   rectal fissure repair     s/p benign breast biopsy  2003   right   s/p left foot surgury  2009   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   spiral fx left foot  2008   no surgury   TMJ ARTHROPLASTY  1989   TONGUE SURGERY      1988- to remove scar tissue growth    TONSILLECTOMY     Patient Active Problem List   Diagnosis Date Noted   Acute upper respiratory infection 05/16/2021   Nasal dryness 02/04/2021   Chronic sinusitis 11/25/2020   Allergic rhinitis 07/08/2020   Hypomagnesemia 06/10/2020   Goals of care, counseling/discussion 06/21/2019   Family history of breast cancer    Family history of pancreatic cancer    Family history of uterine cancer    Family history of lymphoma    Family history of lung cancer    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HCC) 06/02/2019   Recurrent cancer of left breast (HCC) 05/15/2019   Pes anserine bursitis 03/31/2019   HTN (hypertension) 02/10/2019   Hyperglycemia 06/21/2018   Left carpal tunnel syndrome 02/22/2018   Rotator cuff arthropathy of left shoulder 09/20/2017   Arthritis of hand 08/23/2017   Pain in right hand 08/23/2017   Cervical radiculitis 04/09/2017   Left shoulder pain 04/09/2017   Dyspnea on exertion 05/14/2016     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   Lymphedema of left arm 01/14/2016   Left arm swelling 12/25/2015   SVT (supraventricular tachycardia)    Left-sided carotid artery disease (HCC) 03/08/2015   Dizziness 01/24/2015   Urinary frequency 01/13/2015   Dizziness and giddiness 01/09/2015   Gallstones 02/21/2013   Malignant neoplasm of upper-inner quadrant of left breast in female, estrogen receptor positive (HCC) 11/28/2012   Muscle cramping 09/12/2012   Right hip pain 09/12/2012   Lower back pain 09/12/2012   COPD GOLD I     Hx of radiation therapy    Right shoulder pain 09/14/2011   Preventative health care 07/29/2010   Skin lesion of left leg 07/29/2010   Paresthesia 07/29/2010   GERD 03/28/2010   CONSTIPATION 03/28/2010   Osteoarthrosis, 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   Anxiety state 05/03/2009   Hyperlipidemia 06/14/2007   FATIGUE 06/14/2007   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  Rationale for Evaluation and Treatment Rehabilitation  SUBJECTIVE:                                                                                                                                                                                          SUBJECTIVE STATEMENT: [name] wanted me to wear my sleeve today until we began bandaging.   PERTINENT HISTORY:  Hx breast cancer with 15 lymph node removal on the L and Bil lumpectomy. Lumpectomy on the L in 1997and the R in 2012. Radiation Bil.  New discovery of metastatic breast Cancer presently on Palbocidib and Anastrozole,  Fulvestrant since 06/2019.Recent PET scan on 10/09/2021 showed no evidence of recurrent or metastatic disease. Prior lymphedema treatment for Left UE in 2021  PAIN:  Are you having pain? Pain in L wrist NPRS scale: pt did not rate on scale Pain location: wrist and hand on left  Pain orientation: Left  PAIN TYPE: stabbing Pain description: intermittent  Aggravating factors: evening Relieving factors: Tribute wrap but doesn't wear the hand piece.  PRECAUTIONS: Other: Left UE lymphedema , Metastatic breast Cancer, COPD  WEIGHT BEARING RESTRICTIONS: No  FALLS:  Has patient fallen in last 6 months? No  LIVING ENVIRONMENT: Lives with: lives alone Lives in: House/apartment Stairs: No; External: 1 steps; on right going up   Has following equipment at home: None  OCCUPATION: retired  LEISURE: reading, puzzles, walks very little  HAND DOMINANCE: right   PRIOR LEVEL OF FUNCTION: Independent  PATIENT GOALS: Try and getting swelling in left arm down. Get  a new compression sleeve that fits properly   OBJECTIVE:  COGNITION: Overall cognitive status: Within functional limits for tasks assessed   PALPATION: Mild pitting edema ulnar border of Left UE  OBSERVATIONS / OTHER ASSESSMENTS: Bandaid on forearm with small amount of clear  drainage showing through. Other aspect of incision that can be viewed is healed. Sensation to light touch in the hand is intact. Dorsum of hand is slightly swollen, and upper arm swollen above where she had TG soft folded over and with bottom folded over several inches, may be making hand swell. She complains of left wrist pain worse at night and has a weak left grip  SENSATION: Light touch: Appears intact throughout left UE   POSTURE: forward head, rounded shoulders  UPPER EXTREMITY AROM/PROM:  A/PROM RIGHT   eval   Shoulder extension   Shoulder flexion   Shoulder abduction   Shoulder internal rotation   Shoulder external rotation     (Blank rows = not tested)  A/PROM LEFT   eval  Shoulder extension   Shoulder flexion   Shoulder abduction   Shoulder internal rotation   Shoulder external rotation     (Blank rows = not tested)  CERVICAL AROM: All within functional limits:   UPPER EXTREMITY STRENGTH: NT  LYMPHEDEMA ASSESSMENTS:   SURGERY TYPE/DATE: Left breast Lumpectomy 1997, Right lumpectomy 2012  NUMBER OF LYMPH NODES REMOVED: 15 on left UE  CHEMOTHERAPY:Yes, presently on medications for metastasis  RADIATION:bilateral   HORMONE TREATMENT: yes  INFECTIONS: yes  LYMPHEDEMA ASSESSMENTS:   LANDMARK RIGHT  eval  10 cm proximal to olecranon process 27.5  Olecranon process 22.4  10 cm proximal to ulnar styloid process 19.0  Just proximal to ulnar styloid process 15.2  Across hand at thumb web space 17.9  At base of 2nd digit 6.3  (Blank rows = not tested)  LANDMARK LEFT  eval  10 cm proximal to olecranon process 28.2  Olecranon process 27.5  10 cm proximal to ulnar styloid  process 24.0  Just proximal to ulnar styloid process 18.2  Across hand at thumb web space 18.5  At base of 2nd digit 6.5  (Blank rows = not tested)      LLIS : 47%   TODAY'S TREATMENT:                                                                                                                           DATE 12/09/21: Manual lymph drainage in supine as follows: short neck, (no diaphragmatic breathing due to shortness of breath from medication) , left inguinal nodes, left axillo-inguinal anastamoses. Left upper extremity working proximal to distal then retracing all steps being very careful around area on forearm where pt recently had surgery.  Compression bandaging: TG soft from hand to axilla, mollelast to digits 1-5, artiflex from hand to axilla, 1 6cm bandage at hand, 1 8 cm bandage from wrist to axilla in herringbone, 1 12 cm bandage from hand to axilla  Eval: Discussed POC and wrapping with pt. Pt is agreeable to try wrapping for 2 weeks to see if her arm would reduce. Swelling is soft and with compliance should reduce so she can be fit for a new sleeve. Discussed caution folding over TG soft too much because of increasing the pressure possibly increasing swelling at hand and at upper arm.. PATIENT EDUCATION:  Education details: discussed POC, bandaging, new garments, need for 3x/week, instruct family member for weekends if she would like. Person educated: Patient Education method: Explanation Education comprehension: verbalized understanding  HOME EXERCISE PROGRAM: None given today  ASSESSMENT:  CLINICAL IMPRESSION: Began MLD and compression bandaging today. Only added 3 bandages today to see how pt tolerates them. Educated to remove bandages if she has increased pain or numbness that does not go away when she moves her hand.   OBJECTIVE IMPAIRMENTS: decreased activity tolerance, decreased knowledge of condition, decreased ROM, increased edema, impaired UE functional use,  postural dysfunction, and pain.   ACTIVITY LIMITATIONS: lifting, dressing, and reach over head  PARTICIPATION LIMITATIONS: cleaning, yard work, and reaching activities  PERSONAL FACTORS: 3+ comorbidities: Breast Cancer s/p radiation and with metastasis, COPD, OA, current cancer meds  are also affecting patient's functional outcome.   REHAB POTENTIAL: Good  CLINICAL DECISION MAKING: Stable/uncomplicated  EVALUATION COMPLEXITY: Low  GOALS: Goals reviewed with patient? Yes  SHORT TERM GOALS: Target date: 12/30/2021    Pt will be tolerant of compression bandaging Baseline: Goal status: INITIAL  2.  Pt will have reduction at olecranon and 10 cm prox to ulna by 1.5 cm Baseline:  Goal status: INITIAL  LONG TERM GOALS: Target date: 01/20/2022    Pt will have decreased edema at olecranon and 10 cm prox to ulna styloid by 2.5 cm Baseline:  Goal status: INITIAL  2.  Pt will be fit for appropriate compression sleeve Baseline:  Goal status: INITIAL  3.  Pt will be independent with self care of lymphedema including day and night compression Baseline:  Goal status: INITIAL  4.  Pt will improve LLIS to no greater than 30% to demonstrate improved function  Baseline:  Goal status: INITIAL    PLAN:  PT FREQUENCY: 3x/week  PT DURATION: 6 weeks  PLANNED INTERVENTIONS: Therapeutic exercises, Patient/Family education, Self Care, Orthotic/Fit training, Manual lymph drainage, Compression bandaging, and Manual therapy  PLAN FOR NEXT SESSION: check shoulder ROM? Make goal prn, how were bandages?, continue CDT. Bandage 2 weeks and if not reducing discontinue, otherwise continue until plateau then fit for new garments.   Blaire Breedlove Blue, PT 12/09/2021, 3:05 PM 

## 2021-12-10 ENCOUNTER — Other Ambulatory Visit: Payer: Self-pay | Admitting: Hematology and Oncology

## 2021-12-11 ENCOUNTER — Ambulatory Visit: Payer: Medicare Other

## 2021-12-11 DIAGNOSIS — C44619 Basal cell carcinoma of skin of left upper limb, including shoulder: Secondary | ICD-10-CM | POA: Diagnosis not present

## 2021-12-11 DIAGNOSIS — C44629 Squamous cell carcinoma of skin of left upper limb, including shoulder: Secondary | ICD-10-CM | POA: Diagnosis not present

## 2021-12-11 DIAGNOSIS — I890 Lymphedema, not elsewhere classified: Secondary | ICD-10-CM

## 2021-12-11 NOTE — Therapy (Signed)
OUTPATIENT PHYSICAL THERAPY ONCOLOGY TREATMENT  Patient Name: Stacey [name] MRN: [id number] DOB:06/21/1940, 81 y.o., female Today's Date: 12/11/2021   PT End of Session - 12/11/21 1356     Visit Number 3    Number of Visits 18    Date for PT Re-Evaluation 01/05/22    PT Start Time 1400    PT Stop Time 1455    PT Time Calculation (min) 55 min    Activity Tolerance Patient tolerated treatment well    Behavior During Therapy WFL for tasks assessed/performed             Past Medical History:  Diagnosis Date   ANEMIA-NOS    ANXIETY    Breast cancer (HCC) 1997 L, 2012 R   s/p chemo/xrt   COPD    resolved   DIVERTICULOSIS, COLON 2008   Dizziness    Family history of breast cancer    Family history of lung cancer    Family history of lymphoma    Family history of pancreatic cancer    Family history of uterine cancer    GERD    Hx of radiation therapy 10/19/11 -12/03/11   right breast   HYPERLIPIDEMIA    IRRITABLE BOWEL SYNDROME, HX OF    Left-sided carotid artery disease (HCC)    moderate left ICA stenosis   OSTEOARTHRITIS, HAND    PSVT (paroxysmal supraventricular tachycardia)    symptomatic on event monitor   Past Surgical History:  Procedure Laterality Date   ABDOMINAL HYSTERECTOMY     APPENDECTOMY     BREAST BIOPSY  11/14/10    r breast: inv, insitu mammary carcinoma w/calcif, er/pr +, her2 -   BREAST SURGERY     lumpectomy   CATARACT EXTRACTION     both eyes   ELECTROPHYSIOLOGIC STUDY N/A 05/10/2015   Procedure: SVT Ablation;  Surgeon: Will Martin Camnitz, MD;  Location: [facility];  Service: Cardiovascular;  Laterality: N/A;   HERNIA REPAIR     inguinal herniorrhapy  1984   left   IR US GUIDE BX ASP/DRAIN  05/26/2019   rectal fissure repair     s/p benign breast biopsy  2003   right   s/p left foot surgury  2009   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   spiral fx left foot  2008   no surgury   TMJ ARTHROPLASTY  1989   TONGUE SURGERY      1988- to remove scar tissue growth    TONSILLECTOMY     Patient Active Problem List   Diagnosis Date Noted   Acute upper respiratory infection 05/16/2021   Nasal dryness 02/04/2021   Chronic sinusitis 11/25/2020   Allergic rhinitis 07/08/2020   Hypomagnesemia 06/10/2020   Goals of care, counseling/discussion 06/21/2019   Family history of breast cancer    Family history of pancreatic cancer    Family history of uterine cancer    Family history of lymphoma    Family history of lung cancer    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HCC) 06/02/2019   Recurrent cancer of left breast (HCC) 05/15/2019   Pes anserine bursitis 03/31/2019   HTN (hypertension) 02/10/2019   Hyperglycemia 06/21/2018   Left carpal tunnel syndrome 02/22/2018   Rotator cuff arthropathy of left shoulder 09/20/2017   Arthritis of hand 08/23/2017   Pain in right hand 08/23/2017   Cervical radiculitis 04/09/2017   Left shoulder pain 04/09/2017   Dyspnea on exertion 05/14/2016     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   Lymphedema of left arm 01/14/2016   Left arm swelling 12/25/2015   SVT (supraventricular tachycardia)    Left-sided carotid artery disease (HCC) 03/08/2015   Dizziness 01/24/2015   Urinary frequency 01/13/2015   Dizziness and giddiness 01/09/2015   Gallstones 02/21/2013   Malignant neoplasm of upper-inner quadrant of left breast in female, estrogen receptor positive (HCC) 11/28/2012   Muscle cramping 09/12/2012   Right hip pain 09/12/2012   Lower back pain 09/12/2012   COPD GOLD I     Hx of radiation therapy    Right shoulder pain 09/14/2011   Preventative health care 07/29/2010   Skin lesion of left leg 07/29/2010   Paresthesia 07/29/2010   GERD 03/28/2010   CONSTIPATION 03/28/2010   Osteoarthrosis, 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   Anxiety state 05/03/2009   Hyperlipidemia 06/14/2007   FATIGUE 06/14/2007   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  Rationale for Evaluation and Treatment Rehabilitation  SUBJECTIVE:                                                                                                                                                                                          SUBJECTIVE STATEMENT: I was comfortable with the bandages. I took them off around 11:00 today. My hand looked better .  PERTINENT HISTORY:  Hx breast cancer with 15 lymph node removal on the L and Bil lumpectomy. Lumpectomy on the L in 1997and the R in 2012. Radiation Bil.  New discovery of metastatic breast Cancer presently on Palbocidib and Anastrozole,  Fulvestrant since 06/2019.Recent PET scan on 10/09/2021 showed no evidence of recurrent or metastatic disease. Prior lymphedema treatment for Left UE in 2021  PAIN:  Are you having pain? Pain in L wrist NPRS scale: pt did not rate on scale Pain location: wrist and hand on left  Pain orientation: Left  PAIN TYPE: stabbing Pain description: intermittent  Aggravating factors: evening Relieving factors: Tribute wrap but doesn't wear the hand piece.  PRECAUTIONS: Other: Left UE lymphedema , Metastatic breast Cancer, COPD  WEIGHT BEARING RESTRICTIONS: No  FALLS:  Has patient fallen in last 6 months? No  LIVING ENVIRONMENT: Lives with: lives alone Lives in: House/apartment Stairs: No; External: 1   steps; on right going up Has following equipment at home: None  OCCUPATION: retired  LEISURE: reading, puzzles, walks very little  HAND DOMINANCE: right   PRIOR LEVEL OF FUNCTION: Independent  PATIENT GOALS: Try and getting  swelling in left arm down. Get a new compression sleeve that fits properly   OBJECTIVE:  COGNITION: Overall cognitive status: Within functional limits for tasks assessed   PALPATION: Mild pitting edema ulnar border of Left UE  OBSERVATIONS / OTHER ASSESSMENTS: Bandaid on forearm with small amount of clear  drainage showing through. Other aspect of incision that can be viewed is healed. Sensation to light touch in the hand is intact. Dorsum of hand is slightly swollen, and upper arm swollen above where she had TG soft folded over and with bottom folded over several inches, may be making hand swell. She complains of left wrist pain worse at night and has a weak left grip  SENSATION: Light touch: Appears intact throughout left UE   POSTURE: forward head, rounded shoulders  UPPER EXTREMITY AROM/PROM:  A/PROM RIGHT   eval   Shoulder extension   Shoulder flexion   Shoulder abduction   Shoulder internal rotation   Shoulder external rotation     (Blank rows = not tested)  A/PROM LEFT   eval  Shoulder extension   Shoulder flexion   Shoulder abduction   Shoulder internal rotation   Shoulder external rotation     (Blank rows = not tested)  CERVICAL AROM: All within functional limits:   UPPER EXTREMITY STRENGTH: NT  LYMPHEDEMA ASSESSMENTS:   SURGERY TYPE/DATE: Left breast Lumpectomy 1997, Right lumpectomy 2012  NUMBER OF LYMPH NODES REMOVED: 15 on left UE  CHEMOTHERAPY:Yes, presently on medications for metastasis  RADIATION:bilateral   HORMONE TREATMENT: yes  INFECTIONS: yes  LYMPHEDEMA ASSESSMENTS:   LANDMARK RIGHT  eval  10 cm proximal to olecranon process 27.5  Olecranon process 22.4  10 cm proximal to ulnar styloid process 19.0  Just proximal to ulnar styloid process 15.2  Across hand at thumb web space 17.9  At base of 2nd digit 6.3  (Blank rows = not tested)  LANDMARK LEFT  eval  10 cm proximal to olecranon process 28.2  Olecranon process 27.5  10  cm proximal to ulnar styloid process 24.0  Just proximal to ulnar styloid process 18.2  Across hand at thumb web space 18.5  At base of 2nd digit 6.5  (Blank rows = not tested)      LLIS : 47%   TODAY'S TREATMENT:                                                                                                                           DATE  12/11/2021 Measured pt at 10 cm above ulna styloid and pt had a 2 cm reduction Manual lymph drainage in supine as follows: short neck, (no diaphragmatic breathing due to shortness of breath from medication) , left inguinal nodes, left axillo-inguinal anastamoses. Left upper   extremity working proximal to distal then retracing all steps being very careful around area on forearm where pt recently had surgery. Compression bandaging: TG soft from hand to axilla, mollelast to digits 1-5, artiflex from hand to axilla, 1 6cm bandage at hand, 1 8 cm bandage from wrist to axilla in herringbone, 1 12 cm bandage from hand to axilla. Pt advised 4 days is a long time to wear wraps. If they slide down or become uncomfortable she must remove. She may try to get her appts Mon-Thurs. Instead of Tues Thurs.     12/09/21: Manual lymph drainage in supine as follows: short neck, (no diaphragmatic breathing due to shortness of breath from medication) , left inguinal nodes, left axillo-inguinal anastamoses. Left upper extremity working proximal to distal then retracing all steps being very careful around area on forearm where pt recently had surgery. Compression bandaging: TG soft from hand to axilla, mollelast to digits 1-5, artiflex from hand to axilla, 1 6cm bandage at hand, 1 8 cm bandage from wrist to axilla in herringbone, 1 12 cm bandage from hand to axilla  Eval: Discussed POC and wrapping with pt. Pt is agreeable to try wrapping for 2 weeks to see if her arm would reduce. Swelling is soft and with compliance should reduce so she can be fit for a new sleeve. Discussed  caution folding over TG soft too much because of increasing the pressure possibly increasing swelling at hand and at upper arm.. PATIENT EDUCATION:  Education details: discussed POC, bandaging, new garments, need for 3x/week, instruct family member for weekends if she would like. Person educated: Patient Education method: Explanation Education comprehension: verbalized understanding  HOME EXERCISE PROGRAM: None given today  ASSESSMENT:  CLINICAL IMPRESSION: Began MLD and compression bandaging today. Only added 3 bandages today to see how pt tolerates them. Educated to remove bandages if she has increased pain or numbness that does not go away when she moves her hand.   OBJECTIVE IMPAIRMENTS: decreased activity tolerance, decreased knowledge of condition, decreased ROM, increased edema, impaired UE functional use, postural dysfunction, and pain.   ACTIVITY LIMITATIONS: lifting, dressing, and reach over head  PARTICIPATION LIMITATIONS: cleaning, yard work, and reaching activities  PERSONAL FACTORS: 3+ comorbidities: Breast Cancer s/p radiation and with metastasis, COPD, OA, current cancer meds  are also affecting patient's functional outcome.   REHAB POTENTIAL: Good  CLINICAL DECISION MAKING: Stable/uncomplicated  EVALUATION COMPLEXITY: Low  GOALS: Goals reviewed with patient? Yes  SHORT TERM GOALS: Target date: 01/01/2022    Pt will be tolerant of compression bandaging Baseline: Goal status: INITIAL  2.  Pt will have reduction at olecranon and 10 cm prox to ulna by 1.5 cm Baseline:  Goal status: INITIAL  LONG TERM GOALS: Target date: 01/22/2022    Pt will have decreased edema at olecranon and 10 cm prox to ulna styloid by 2.5 cm Baseline:  Goal status: INITIAL  2.  Pt will be fit for appropriate compression sleeve Baseline:  Goal status: INITIAL  3.  Pt will be independent with self care of lymphedema including day and night compression Baseline:  Goal status:  INITIAL  4.  Pt will improve LLIS to no greater than 30% to demonstrate improved function  Baseline:  Goal status: INITIAL    PLAN:  PT FREQUENCY: 3x/week  PT DURATION: 6 weeks  PLANNED INTERVENTIONS: Therapeutic exercises, Patient/Family education, Self Care, Orthotic/Fit training, Manual lymph drainage, Compression bandaging, and Manual therapy  PLAN FOR NEXT SESSION: check shoulder ROM? Make goal   prn, how were bandages?, continue CDT. Bandage 2 weeks and if not reducing discontinue, otherwise continue until plateau then fit for new garments.   [name] J Walcott, PT 12/11/2021, 3:00 PM 

## 2021-12-16 ENCOUNTER — Ambulatory Visit: Payer: Medicare Other

## 2021-12-16 DIAGNOSIS — C44629 Squamous cell carcinoma of skin of left upper limb, including shoulder: Secondary | ICD-10-CM

## 2021-12-16 DIAGNOSIS — I890 Lymphedema, not elsewhere classified: Secondary | ICD-10-CM | POA: Diagnosis not present

## 2021-12-16 DIAGNOSIS — C44619 Basal cell carcinoma of skin of left upper limb, including shoulder: Secondary | ICD-10-CM | POA: Diagnosis not present

## 2021-12-16 NOTE — Therapy (Signed)
OUTPATIENT PHYSICAL THERAPY ONCOLOGY TREATMENT  Patient Name: Stacey [name] MRN: [id number] DOB:10-20-1940, 81 y.o., female Today's Date: 12/16/2021   PT End of Session - 12/16/21 1357     Visit Number 4    Number of Visits 18    Date for PT Re-Evaluation 01/05/22    PT Start Time 1400    PT Stop Time 1500    PT Time Calculation (min) 60 min    Activity Tolerance Patient tolerated treatment well    Behavior During Therapy [facility] for tasks assessed/performed             Past Medical History:  Diagnosis Date   ANEMIA-NOS    ANXIETY    Breast cancer ([place]) 1997 L, 2012 R   s/p chemo/xrt   COPD    resolved   DIVERTICULOSIS, COLON 2008   Dizziness    Family history of breast cancer    Family history of lung cancer    Family history of lymphoma    Family history of pancreatic cancer    Family history of uterine cancer    GERD    Hx of radiation therapy 10/19/11 -12/03/11   right breast   HYPERLIPIDEMIA    IRRITABLE BOWEL SYNDROME, HX OF    Left-sided carotid artery disease (HCC)    moderate left ICA stenosis   OSTEOARTHRITIS, HAND    PSVT (paroxysmal supraventricular tachycardia)    symptomatic on event monitor   Past Surgical History:  Procedure Laterality Date   ABDOMINAL HYSTERECTOMY     APPENDECTOMY     BREAST BIOPSY  11/14/10    r breast: inv, insitu mammary carcinoma w/calcif, er/pr +, her2 -   BREAST SURGERY     lumpectomy   CATARACT EXTRACTION     both eyes   ELECTROPHYSIOLOGIC STUDY N/A 05/10/2015   Procedure: SVT Ablation;  Surgeon: Will Meredith Leeds, MD;  Location: [facility];  Service: Cardiovascular;  Laterality: N/A;   HERNIA REPAIR     inguinal herniorrhapy  1984   left   IR US GUIDE BX ASP/DRAIN  05/26/2019   rectal fissure repair     s/p benign breast biopsy  2003   right   s/p left foot surgury  2009   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   spiral fx left foot  2008   no surgury   TMJ ARTHROPLASTY  1989   TONGUE SURGERY      1988- to remove scar tissue growth    TONSILLECTOMY     Patient Active Problem List   Diagnosis Date Noted   Acute upper respiratory infection 05/16/2021   Nasal dryness 02/04/2021   Chronic sinusitis 11/25/2020   Allergic rhinitis 07/08/2020   Hypomagnesemia 06/10/2020   Goals of care, counseling/discussion 06/21/2019   Family history of breast cancer    Family history of pancreatic cancer    Family history of uterine cancer    Family history of lymphoma    Family history of lung cancer    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[place]) 06/02/2019   Recurrent cancer of left breast ([place]) 05/15/2019   Pes anserine bursitis 03/31/2019   HTN (hypertension) 02/10/2019   Hyperglycemia 06/21/2018   Left carpal tunnel syndrome 02/22/2018   Rotator cuff arthropathy of left shoulder 09/20/2017   Arthritis of hand 08/23/2017   Pain in right hand 08/23/2017   Cervical radiculitis 04/09/2017   Left shoulder pain 04/09/2017   Dyspnea on exertion 05/14/2016  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   Lymphedema of left arm 01/14/2016   Left arm swelling 12/25/2015   SVT (supraventricular tachycardia)    Left-sided carotid artery disease ([place]) 03/08/2015   Dizziness 01/24/2015   Urinary frequency 01/13/2015   Dizziness and giddiness 01/09/2015   Gallstones 02/21/2013   Malignant neoplasm of upper-inner quadrant of left breast in female, estrogen receptor positive ([place]) 11/28/2012   Muscle cramping 09/12/2012   Right hip pain 09/12/2012   Lower back pain 09/12/2012   COPD GOLD I     Hx of radiation therapy    Right shoulder pain 09/14/2011   Preventative health care 07/29/2010   Skin lesion of left leg 07/29/2010   Paresthesia 07/29/2010   GERD 03/28/2010   CONSTIPATION 03/28/2010   Osteoarthrosis, 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   Anxiety state 05/03/2009   Hyperlipidemia 06/14/2007   FATIGUE 06/14/2007   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  Rationale for Evaluation and Treatment Rehabilitation  SUBJECTIVE:                                                                                                                                                                                          SUBJECTIVE STATEMENT: I took my wraps off around 11:00 today that you put on last Thursday. I lost a finger wrap  or 2 so I cut it off, but otherwise it was fine.  PERTINENT HISTORY:  Hx breast cancer with 15 lymph node removal on the L and Bil lumpectomy. Lumpectomy on the L in 1997and the R in 2012. Radiation Bil.  New discovery of metastatic breast Cancer presently on Palbocidib and Anastrozole,  Fulvestrant since 06/2019.Recent PET scan on 10/09/2021 showed no evidence of recurrent or metastatic disease. Prior lymphedema treatment for Left UE in 2021  PAIN:  Are you having pain? Pain in L wrist NPRS scale: pt did not rate on scale Pain location: wrist and hand on left  Pain orientation: Left  PAIN TYPE: stabbing Pain description: intermittent  Aggravating factors: evening Relieving factors: Tribute wrap but doesn't wear the hand piece.  PRECAUTIONS: Other: Left UE lymphedema , Metastatic breast Cancer, COPD  WEIGHT BEARING RESTRICTIONS: No  FALLS:  Has patient fallen in last 6 months? No  LIVING ENVIRONMENT: Lives with: lives alone Lives in: House/apartment Stairs: No; External: 1 steps; on right going up Has following equipment at home: None  OCCUPATION: retired  LEISURE: reading, puzzles, walks very little  HAND DOMINANCE: right   PRIOR LEVEL OF FUNCTION:  Independent  PATIENT GOALS: Try and getting swelling in left arm down. Get a new compression sleeve that fits properly   OBJECTIVE:  COGNITION: Overall cognitive status: Within functional limits for tasks assessed   PALPATION: Mild pitting edema ulnar border of Left UE  OBSERVATIONS / OTHER ASSESSMENTS: Bandaid on forearm with small amount of clear  drainage showing through. Other aspect of incision that can be viewed is healed. Sensation to light touch in the hand is intact. Dorsum of hand is slightly swollen, and upper arm swollen above where she had TG soft folded over and with bottom folded over several inches, may be making hand swell. She complains of left wrist pain worse at night and has a weak left grip  SENSATION: Light touch: Appears intact throughout left UE   POSTURE: forward head, rounded shoulders  UPPER EXTREMITY AROM/PROM:  A/PROM RIGHT   eval   Shoulder extension   Shoulder flexion   Shoulder abduction   Shoulder internal rotation   Shoulder external rotation     (Blank rows = not tested)  A/PROM LEFT   eval  Shoulder extension   Shoulder flexion   Shoulder abduction   Shoulder internal rotation   Shoulder external rotation     (Blank rows = not tested)  CERVICAL AROM: All within functional limits:   UPPER EXTREMITY STRENGTH: NT  LYMPHEDEMA ASSESSMENTS:   SURGERY TYPE/DATE: Left breast Lumpectomy 1997, Right lumpectomy 2012  NUMBER OF LYMPH NODES REMOVED: 15 on left UE  CHEMOTHERAPY:Yes, presently on medications for metastasis  RADIATION:bilateral   HORMONE TREATMENT: yes  INFECTIONS: yes  LYMPHEDEMA ASSESSMENTS:   LANDMARK RIGHT  eval  10 cm proximal to olecranon process 27.5  Olecranon process 22.4  10 cm proximal to ulnar styloid process 19.0  Just proximal to ulnar styloid process 15.2  Across hand at thumb web space 17.9  At base of 2nd digit 6.3  (Blank rows = not tested)  LANDMARK LEFT  eval  10 cm proximal to  olecranon process 28.2  Olecranon process 27.5  10 cm proximal to ulnar styloid process 24.0  Just proximal to ulnar styloid process 18.2  Across hand at thumb web space 18.5  At base of 2nd digit 6.5  (Blank rows = not tested)      LLIS : 47%   TODAY'S TREATMENT:                                                                                                                           DATE  12/16/2021  Manual lymph drainage in supine as follows: short neck, (no diaphragmatic breathing due to shortness of breath from medication) , left inguinal nodes, left axillo-inguinal anastamoses. Left upper extremity  working proximal to distal then retracing all steps being very careful around area on forearm where pt recently had surgery. Compression bandaging: TG soft from hand to axilla, mollelast to digits 1-5, artiflex from hand to axilla, 1 6cm bandage at hand, 1 8 cm bandage from wrist to axilla in spiral, 1 12 cm bandage from hand to axilla with spiral.  Initially wrapped 8 cm wrap with herringbone with 10 cm spiral over it but was afraid it would be too tight sow rewrapped both in spiral.  12/11/2021 Measured pt at 10 cm above ulna styloid and pt had a 2 cm reduction Manual lymph drainage in supine as follows: short neck, (no diaphragmatic breathing due to shortness of breath from medication) , left inguinal nodes, left axillo-inguinal anastamoses. Left upper extremity working proximal to distal then retracing all steps being very careful around area on forearm where pt recently had surgery. Compression bandaging: TG soft from hand to axilla, mollelast to digits 1-5, artiflex from hand to axilla, 1 6cm bandage at hand, 1 8 cm bandage from wrist to axilla in herringbone, 1 12 cm bandage from hand to axilla. Pt advised 4 days is a long time to wear wraps. If they slide down or become uncomfortable she must remove. She may try to get her appts Mon-Thurs. Instead of Tues Thurs.  12/09/21: Manual  lymph drainage in supine as follows: short neck, (no diaphragmatic breathing due to shortness of breath from medication) , left inguinal nodes, left axillo-inguinal anastamoses. Left upper extremity working proximal to distal then retracing all steps being very careful around area on forearm where pt recently had surgery. Compression bandaging: TG soft from hand to axilla, mollelast to digits 1-5, artiflex from hand to axilla, 1 6cm bandage at hand, 1 8 cm bandage from wrist to axilla in herringbone, 1 12 cm bandage from hand to axilla  Eval: Discussed POC and wrapping with pt. Pt is agreeable to try wrapping for 2 weeks to see if her arm would reduce. Swelling is soft and with compliance should reduce so she can be fit for a new sleeve. Discussed caution folding over TG soft too much because of increasing the pressure possibly increasing swelling at hand and at upper arm.Marland Kitchen PATIENT EDUCATION:  Education details: discussed POC, bandaging, new garments, need for 3x/week, instruct family member for weekends if she would like. Person educated: Patient Education method: Explanation Education comprehension: verbalized understanding  HOME EXERCISE PROGRAM: None given today  ASSESSMENT:  CLINICAL IMPRESSION: Pt is tolerating wraps very well and arm does appear reduced today, but it is not ideal that she wears the same wrap for 4 days. Will measure on Thursday since she has a fresh wrap on today. 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   OBJECTIVE IMPAIRMENTS: decreased activity tolerance, decreased knowledge of condition, decreased ROM, increased edema, impaired UE functional use, postural dysfunction, and pain.   ACTIVITY LIMITATIONS: lifting, dressing, and reach over head  PARTICIPATION LIMITATIONS: cleaning, yard work, and reaching activities  PERSONAL FACTORS: 3+ comorbidities: Breast Cancer s/p radiation and  with metastasis, COPD, OA, current cancer meds  are also affecting patient's functional outcome.   REHAB POTENTIAL: Good  CLINICAL DECISION MAKING: Stable/uncomplicated  EVALUATION COMPLEXITY: Low  GOALS: Goals reviewed with patient? Yes  SHORT TERM GOALS: Target date: 01/06/2022    Pt will be tolerant of compression bandaging Baseline: Goal status: INITIAL  2.  Pt will have reduction at olecranon and 10 cm prox to ulna by 1.5 cm Baseline:  Goal status: INITIAL  LONG TERM GOALS: Target date: 01/27/2022    Pt will have decreased edema at olecranon and 10 cm prox to ulna styloid by 2.5 cm Baseline:  Goal status: INITIAL  2.  Pt will be fit for appropriate compression sleeve Baseline:  Goal status: INITIAL  3.  Pt will be independent with self care of lymphedema including day and night compression Baseline:  Goal status: INITIAL  4.  Pt will improve LLIS to no greater than 30% to demonstrate improved function  Baseline:  Goal status: INITIAL    PLAN:  PT FREQUENCY: 3x/week  PT DURATION: 6 weeks  PLANNED INTERVENTIONS: Therapeutic exercises, Patient/Family education, Self Care, Orthotic/Fit training, Manual lymph drainage, Compression bandaging, and Manual therapy  PLAN FOR NEXT SESSION:  Measure next visit,check shoulder ROM? Make goal prn, how were bandages?, continue CDT. Bandage 2 weeks and if not reducing discontinue, otherwise continue until plateau then fit for new garments.   Claris Pong, PT 12/16/2021, 5:06 PM

## 2021-12-18 ENCOUNTER — Ambulatory Visit: Payer: Medicare Other

## 2021-12-18 DIAGNOSIS — C44619 Basal cell carcinoma of skin of left upper limb, including shoulder: Secondary | ICD-10-CM

## 2021-12-18 DIAGNOSIS — I890 Lymphedema, not elsewhere classified: Secondary | ICD-10-CM | POA: Diagnosis not present

## 2021-12-18 DIAGNOSIS — C44629 Squamous cell carcinoma of skin of left upper limb, including shoulder: Secondary | ICD-10-CM | POA: Diagnosis not present

## 2021-12-18 NOTE — Therapy (Signed)
OUTPATIENT PHYSICAL THERAPY ONCOLOGY TREATMENT  Patient Name: Stacey [name] MRN: [id number] DOB:11-17-1940, 81 y.o., female Today's Date: 12/18/2021   PT End of Session - 12/18/21 1558     Visit Number 5    Number of Visits 18    Date for PT Re-Evaluation 01/05/22    PT Start Time 1600    PT Stop Time 8115    PT Time Calculation (min) 65 min    Activity Tolerance Patient tolerated treatment well    Behavior During Therapy [facility] for tasks assessed/performed             Past Medical History:  Diagnosis Date   ANEMIA-NOS    ANXIETY    Breast cancer ([place]) 1997 L, 2012 R   s/p chemo/xrt   COPD    resolved   DIVERTICULOSIS, COLON 2008   Dizziness    Family history of breast cancer    Family history of lung cancer    Family history of lymphoma    Family history of pancreatic cancer    Family history of uterine cancer    GERD    Hx of radiation therapy 10/19/11 -12/03/11   right breast   HYPERLIPIDEMIA    IRRITABLE BOWEL SYNDROME, HX OF    Left-sided carotid artery disease (HCC)    moderate left ICA stenosis   OSTEOARTHRITIS, HAND    PSVT (paroxysmal supraventricular tachycardia)    symptomatic on event monitor   Past Surgical History:  Procedure Laterality Date   ABDOMINAL HYSTERECTOMY     APPENDECTOMY     BREAST BIOPSY  11/14/10    r breast: inv, insitu mammary carcinoma w/calcif, er/pr +, her2 -   BREAST SURGERY     lumpectomy   CATARACT EXTRACTION     both eyes   ELECTROPHYSIOLOGIC STUDY N/A 05/10/2015   Procedure: SVT Ablation;  Surgeon: Will Meredith Leeds, MD;  Location: [facility];  Service: Cardiovascular;  Laterality: N/A;   HERNIA REPAIR     inguinal herniorrhapy  1984   left   IR US GUIDE BX ASP/DRAIN  05/26/2019   rectal fissure repair     s/p benign breast biopsy  2003   right   s/p left foot surgury  2009   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   spiral fx left foot  2008   no surgury   TMJ ARTHROPLASTY  1989   TONGUE SURGERY      1988- to remove scar tissue growth    TONSILLECTOMY     Patient Active Problem List   Diagnosis Date Noted   Acute upper respiratory infection 05/16/2021   Nasal dryness 02/04/2021   Chronic sinusitis 11/25/2020   Allergic rhinitis 07/08/2020   Hypomagnesemia 06/10/2020   Goals of care, counseling/discussion 06/21/2019   Family history of breast cancer    Family history of pancreatic cancer    Family history of uterine cancer    Family history of lymphoma    Family history of lung cancer    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[place]) 06/02/2019   Recurrent cancer of left breast ([place]) 05/15/2019   Pes anserine bursitis 03/31/2019   HTN (hypertension) 02/10/2019   Hyperglycemia 06/21/2018   Left carpal tunnel syndrome 02/22/2018   Rotator cuff arthropathy of left shoulder 09/20/2017   Arthritis of hand 08/23/2017   Pain in right hand 08/23/2017   Cervical radiculitis 04/09/2017   Left shoulder pain 04/09/2017   Dyspnea on exertion 05/14/2016  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   Lymphedema of left arm 01/14/2016   Left arm swelling 12/25/2015   SVT (supraventricular tachycardia)    Left-sided carotid artery disease ([place]) 03/08/2015   Dizziness 01/24/2015   Urinary frequency 01/13/2015   Dizziness and giddiness 01/09/2015   Gallstones 02/21/2013   Malignant neoplasm of upper-inner quadrant of left breast in female, estrogen receptor positive ([place]) 11/28/2012   Muscle cramping 09/12/2012   Right hip pain 09/12/2012   Lower back pain 09/12/2012   COPD GOLD I     Hx of radiation therapy    Right shoulder pain 09/14/2011   Preventative health care 07/29/2010   Skin lesion of left leg 07/29/2010   Paresthesia 07/29/2010   GERD 03/28/2010   CONSTIPATION 03/28/2010   Osteoarthrosis, 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   Anxiety state 05/03/2009   Hyperlipidemia 06/14/2007   FATIGUE 06/14/2007   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  Rationale for Evaluation and Treatment Rehabilitation  SUBJECTIVE:                                                                                                                                                                                          SUBJECTIVE STATEMENT: I left my wraps on because you said you were going to  measure. It was a little tight but it loosened up.  PERTINENT HISTORY:  Hx breast cancer with 15 lymph node removal on the L and Bil lumpectomy. Lumpectomy on the L in 1997and the R in 2012. Radiation Bil.  New discovery of metastatic breast Cancer presently on Palbocidib and Anastrozole,  Fulvestrant since 06/2019.Recent PET scan on 10/09/2021 showed no evidence of recurrent or metastatic disease. Prior lymphedema treatment for Left UE in 2021  PAIN:  Are you having pain? Pain in L wrist NPRS scale: pt did not rate on scale Pain location: wrist and hand on left  Pain orientation: Left  PAIN TYPE: stabbing Pain description: intermittent  Aggravating factors: evening Relieving factors: Tribute wrap but doesn't wear the hand piece.  PRECAUTIONS: Other: Left UE lymphedema , Metastatic breast Cancer, COPD  WEIGHT BEARING RESTRICTIONS: No  FALLS:  Has patient fallen in last 6 months? No  LIVING ENVIRONMENT: Lives with: lives alone Lives in:  House/apartment Stairs: No; External: 1 steps; on right going up Has following equipment at home: None  OCCUPATION: retired  LEISURE: reading, puzzles, walks very little  HAND DOMINANCE: right   PRIOR LEVEL OF FUNCTION: Independent  PATIENT GOALS: Try and  getting swelling in left arm down. Get a new compression sleeve that fits properly   OBJECTIVE:  COGNITION: Overall cognitive status: Within functional limits for tasks assessed   PALPATION: Mild pitting edema ulnar border of Left UE  OBSERVATIONS / OTHER ASSESSMENTS: Bandaid on forearm with small amount of clear  drainage showing through. Other aspect of incision that can be viewed is healed. Sensation to light touch in the hand is intact. Dorsum of hand is slightly swollen, and upper arm swollen above where she had TG soft folded over and with bottom folded over several inches, may be making hand swell. She complains of left wrist pain worse at night and has a weak left grip  SENSATION: Light touch: Appears intact throughout left UE   POSTURE: forward head, rounded shoulders  UPPER EXTREMITY AROM/PROM:  A/PROM RIGHT   eval   Shoulder extension   Shoulder flexion   Shoulder abduction   Shoulder internal rotation   Shoulder external rotation     (Blank rows = not tested)  A/PROM LEFT   eval  Shoulder extension   Shoulder flexion   Shoulder abduction   Shoulder internal rotation   Shoulder external rotation     (Blank rows = not tested)  CERVICAL AROM: All within functional limits:   UPPER EXTREMITY STRENGTH: NT  LYMPHEDEMA ASSESSMENTS:   SURGERY TYPE/DATE: Left breast Lumpectomy 1997, Right lumpectomy 2012  NUMBER OF LYMPH NODES REMOVED: 15 on left UE  CHEMOTHERAPY:Yes, presently on medications for metastasis  RADIATION:bilateral   HORMONE TREATMENT: yes  INFECTIONS: yes  LYMPHEDEMA ASSESSMENTS:   LANDMARK RIGHT  eval  10 cm proximal to olecranon process 27.5  Olecranon process 22.4  10 cm proximal to ulnar styloid process 19.0  Just proximal to ulnar styloid process 15.2  Across hand at thumb web space 17.9  At base of 2nd digit 6.3  (Blank rows = not tested)  LANDMARK LEFT  eval LEFT 12/18/2021  10 cm proximal to olecranon process 28.2 29.0   Olecranon process 27.5 25.3  10 cm proximal to ulnar styloid process 24.0 21  Just proximal to ulnar styloid process 18.2 17.7  Across hand at thumb web space 18.5 18.0  At base of 2nd digit 6.5 6.3  (Blank rows = not tested)      LLIS : 47%   TODAY'S TREATMENT:                                                                                                                           DATE 12/19/2021  Pt measured Manual lymph drainage in supine as follows: short neck, (no diaphragmatic breathing due to shortness of breath from medication) , left inguinal nodes, left axillo-inguinal anastamoses. Left upper  extremity working proximal to distal then retracing all steps being very careful around area on forearm where pt recently had surgery. Compression bandaging:  TG soft from hand to axilla, mollelast to digits 1-5, artiflex from hand to axilla, 1 6cm bandage at hand, 1 8 cm bandage from wrist to axilla in spiral, 1 12 cm bandage from hand to axilla with spiral.   Pt educated to try and wash wraps on Sunday evening and lay on rack to dry. Wear Tribute at night and in the am before coming to therapy later that day.  12/16/2021 Manual lymph drainage in supine as follows: short neck, (no diaphragmatic breathing due to shortness of breath from medication) , left inguinal nodes, left axillo-inguinal anastamoses. Left upper extremity working proximal to distal then retracing all steps being very careful around area on forearm where pt recently had surgery. Compression bandaging:  TG soft from hand to axilla, mollelast to digits 1-5, artiflex from hand to axilla, 1 6cm bandage at hand, 1 8 cm bandage from wrist to axilla in spiral, 1 12 cm bandage from hand to axilla with spiral.   Initially wrapped 8 cm wrap with herringbone with 10 cm spiral over it but was afraid it would be too tight sow rewrapped both in spiral.   12/11/2021 Measured pt at 10 cm above ulna styloid and pt had a 2 cm  reduction Manual lymph drainage in supine as follows: short neck, (no diaphragmatic breathing due to shortness of breath from medication) , left inguinal nodes, left axillo-inguinal anastamoses. Left upper extremity working proximal to distal then retracing all steps being very careful around area on forearm where pt recently had surgery. Compression bandaging: TG soft from hand to axilla, mollelast to digits 1-5, artiflex from hand to axilla, 1 6cm bandage at hand, 1 8 cm bandage from wrist to axilla in herringbone, 1 12 cm bandage from hand to axilla. Pt advised 4 days is a long time to wear wraps. If they slide down or become uncomfortable she must remove. She may try to get her appts Mon-Thurs. Instead of Tues Thurs.  12/09/21: Manual lymph drainage in supine as follows: short neck, (no diaphragmatic breathing due to shortness of breath from medication) , left inguinal nodes, left axillo-inguinal anastamoses. Left upper extremity working proximal to distal then retracing all steps being very careful around area on forearm where pt recently had surgery. Compression bandaging: TG soft from hand to axilla, mollelast to digits 1-5, artiflex from hand to axilla, 1 6cm bandage at hand, 1 8 cm bandage from wrist to axilla in herringbone, 1 12 cm bandage from hand to axilla  Eval: Discussed POC and wrapping with pt. Pt is agreeable to try wrapping for 2 weeks to see if her arm would reduce. Swelling is soft and with compliance should reduce so she can be fit for a new sleeve. Discussed caution folding over TG soft too much because of increasing the pressure possibly increasing swelling at hand and at upper arm.Marland Kitchen PATIENT EDUCATION:  Education details: discussed POC, bandaging, new garments, need for 3x/week, instruct family member for weekends if she would like. Person educated: Patient Education method: Explanation Education comprehension: verbalized understanding  HOME EXERCISE PROGRAM: None given  today  ASSESSMENT:  CLINICAL IMPRESSION: Pt has achieved STG established for being able to tolerate bandaging, and edema reduction. Arm reduced by 3 cm at 10 cm prox to ulna styloid. Pt was asked to launder bandages on Sunday night and wear tribute to bed and  in the am until she gets ready to come to therapy.   OBJECTIVE IMPAIRMENTS: decreased activity tolerance, decreased knowledge of condition, decreased ROM, increased edema, impaired UE functional use, postural dysfunction, and pain.   ACTIVITY LIMITATIONS: lifting, dressing, and reach over head  PARTICIPATION LIMITATIONS: cleaning, yard work, and reaching activities  PERSONAL FACTORS: 3+ comorbidities: Breast Cancer s/p radiation and with metastasis, COPD, OA, current cancer meds  are also affecting patient's functional outcome.   REHAB POTENTIAL: Good  CLINICAL DECISION MAKING: Stable/uncomplicated  EVALUATION COMPLEXITY: Low  GOALS: Goals reviewed with patient? Yes  SHORT TERM GOALS: Target date: 01/08/2022    Pt will be tolerant of compression bandaging Baseline: Goal status: MET  12/18/2021 2.  Pt will have reduction at olecranon and 10 cm prox to ulna by 1.5 cm Baseline:  Goal status: MET 12/18/2021  LONG TERM GOALS: Target date: 01/29/2022    Pt will have decreased edema at olecranon and 10 cm prox to ulna styloid by 2.5 cm Baseline:  Goal status: INITIAL  2.  Pt will be fit for appropriate compression sleeve Baseline:  Goal status: INITIAL  3.  Pt will be independent with self care of lymphedema including day and night compression Baseline:  Goal status: INITIAL  4.  Pt will improve LLIS to no greater than 30% to demonstrate improved function  Baseline:  Goal status: INITIAL    PLAN:  PT FREQUENCY: 3x/week  PT DURATION: 6 weeks  PLANNED INTERVENTIONS: Therapeutic exercises, Patient/Family education, Self Care, Orthotic/Fit training, Manual lymph drainage, Compression bandaging, and Manual  therapy  PLAN FOR NEXT SESSION:  Measure next visit,check shoulder ROM? Make goal prn, how were bandages?, continue CDT. Bandage 2 weeks and if not reducing discontinue, otherwise continue until plateau then fit for new garments.   Claris Pong, PT 12/18/2021, 5:20 PM

## 2021-12-19 ENCOUNTER — Encounter: Payer: Self-pay | Admitting: Hematology and Oncology

## 2021-12-19 ENCOUNTER — Inpatient Hospital Stay: Payer: Medicare Other

## 2021-12-19 ENCOUNTER — Other Ambulatory Visit: Payer: Self-pay | Admitting: *Deleted

## 2021-12-19 ENCOUNTER — Inpatient Hospital Stay: Payer: Medicare Other | Attending: Adult Health | Admitting: Hematology and Oncology

## 2021-12-19 VITALS — BP 115/61 | HR 100 | Temp 97.8°F | Resp 18 | Ht 63.0 in | Wt 141.4 lb

## 2021-12-19 DIAGNOSIS — M8580 Other specified disorders of bone density and structure, unspecified site: Secondary | ICD-10-CM | POA: Diagnosis not present

## 2021-12-19 DIAGNOSIS — C50212 Malignant neoplasm of upper-inner quadrant of left female breast: Secondary | ICD-10-CM | POA: Diagnosis not present

## 2021-12-19 DIAGNOSIS — Z170 Estrogen receptor positive status [ER+]: Secondary | ICD-10-CM

## 2021-12-19 DIAGNOSIS — I10 Essential (primary) hypertension: Secondary | ICD-10-CM | POA: Insufficient documentation

## 2021-12-19 DIAGNOSIS — Z79818 Long term (current) use of other agents affecting estrogen receptors and estrogen levels: Secondary | ICD-10-CM | POA: Diagnosis not present

## 2021-12-19 DIAGNOSIS — Z87891 Personal history of nicotine dependence: Secondary | ICD-10-CM | POA: Insufficient documentation

## 2021-12-19 DIAGNOSIS — K580 Irritable bowel syndrome with diarrhea: Secondary | ICD-10-CM | POA: Diagnosis not present

## 2021-12-19 DIAGNOSIS — Z800 Family history of malignant neoplasm of digestive organs: Secondary | ICD-10-CM | POA: Insufficient documentation

## 2021-12-19 DIAGNOSIS — C50211 Malignant neoplasm of upper-inner quadrant of right female breast: Secondary | ICD-10-CM | POA: Insufficient documentation

## 2021-12-19 DIAGNOSIS — Z803 Family history of malignant neoplasm of breast: Secondary | ICD-10-CM | POA: Diagnosis not present

## 2021-12-19 DIAGNOSIS — C7951 Secondary malignant neoplasm of bone: Secondary | ICD-10-CM

## 2021-12-19 DIAGNOSIS — I471 Supraventricular tachycardia, unspecified: Secondary | ICD-10-CM | POA: Insufficient documentation

## 2021-12-19 LAB — CMP (CANCER CENTER ONLY)
ALT: 9 U/L (ref 0–44)
AST: 15 U/L (ref 15–41)
Albumin: 4.2 g/dL (ref 3.5–5.0)
Alkaline Phosphatase: 68 U/L (ref 38–126)
Anion gap: 10 (ref 5–15)
BUN: 34 mg/dL — ABNORMAL HIGH (ref 8–23)
CO2: 21 mmol/L — ABNORMAL LOW (ref 22–32)
Calcium: 9.9 mg/dL (ref 8.9–10.3)
Chloride: 107 mmol/L (ref 98–111)
Creatinine: 1.28 mg/dL — ABNORMAL HIGH (ref 0.44–1.00)
GFR, Estimated: 42 mL/min — ABNORMAL LOW (ref >60)
Glucose, Bld: 122 mg/dL — ABNORMAL HIGH (ref 70–99)
Potassium: 4.2 mmol/L (ref 3.5–5.1)
Sodium: 138 mmol/L (ref 135–145)
Total Bilirubin: 0.3 mg/dL (ref 0.3–1.2)
Total Protein: 8.2 g/dL — ABNORMAL HIGH (ref 6.5–8.1)

## 2021-12-19 LAB — CBC WITH DIFFERENTIAL (CANCER CENTER ONLY)
Abs Immature Granulocytes: 0.01 10*3/uL (ref 0.00–0.07)
Basophils Absolute: 0 10*3/uL (ref 0.0–0.1)
Basophils Relative: 1 %
Eosinophils Absolute: 0 10*3/uL (ref 0.0–0.5)
Eosinophils Relative: 1 %
HCT: 25.1 % — ABNORMAL LOW (ref 36.0–46.0)
Hemoglobin: 8.5 g/dL — ABNORMAL LOW (ref 12.0–15.0)
Immature Granulocytes: 0 %
Lymphocytes Relative: 22 %
Lymphs Abs: 0.6 10*3/uL — ABNORMAL LOW (ref 0.7–4.0)
MCH: 36.6 pg — ABNORMAL HIGH (ref 26.0–34.0)
MCHC: 33.9 g/dL (ref 30.0–36.0)
MCV: 108.2 fL — ABNORMAL HIGH (ref 80.0–100.0)
Monocytes Absolute: 0.6 10*3/uL (ref 0.1–1.0)
Monocytes Relative: 21 %
Neutro Abs: 1.6 10*3/uL — ABNORMAL LOW (ref 1.7–7.7)
Neutrophils Relative %: 55 %
Platelet Count: 159 10*3/uL (ref 150–400)
RBC: 2.32 MIL/uL — ABNORMAL LOW (ref 3.87–5.11)
RDW: 14.4 % (ref 11.5–15.5)
Smear Review: NORMAL
WBC Count: 2.9 10*3/uL — ABNORMAL LOW (ref 4.0–10.5)
nRBC: 0 % (ref 0.0–0.2)

## 2021-12-19 LAB — MAGNESIUM: Magnesium: 1.4 mg/dL — ABNORMAL LOW (ref 1.7–2.4)

## 2021-12-19 MED ORDER — FULVESTRANT 250 MG/5ML IM SOSY
500.0000 mg | PREFILLED_SYRINGE | Freq: Once | INTRAMUSCULAR | Status: AC
  Administered 2021-12-19: 500 mg via INTRAMUSCULAR
  Filled 2021-12-19 (×2): qty 10

## 2021-12-19 MED ORDER — SODIUM CHLORIDE 0.9 % IV SOLN: INTRAVENOUS | Status: DC

## 2021-12-19 MED ORDER — MAGNESIUM SULFATE 2 GM/50ML IV SOLN
2.0000 g | Freq: Once | INTRAVENOUS | Status: DC
  Filled 2021-12-19: qty 50

## 2021-12-19 NOTE — Patient Instructions (Signed)

## 2021-12-19 NOTE — Progress Notes (Signed)
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   #2 status post 4 cycles of doxorubicin and cyclophosphamide,                          (i) followed by radiation therapy under the care of Dr. [name].   #3 received tamoxifen for a total of seven years    RIGHT BREAST #4  S/P biopsy of the RIGHT breast upper inner quadrant on 11/14/10 showing invasive ductal carcinoma,, grade 2, estrogen receptor 85% and progesterone receptor 57% positive, Ki67 20%, HER2 not amplified.     #5 started neoadjuvant letrozole in November 2012; switched to tamoxifen as of February 2016 due to osteopenia concerns   #6  S/P right lumpectomy with sentinel lymph node biopsy on 09/03/11 for a ypT2, ypN1a, stage IIB invasive lobular carcinoma, grade 2,estrogen receptor 97% positive, progesterone receptor 12% positive, with no HER-2 amplification.   #7 status post right breast radiation therapy under the care of Dr. [name] from 10/19/2011 to 12/03/2011.    #8 did not meet criteria for genetic testing according to her insurance company.   #9 osteopenia, with a T score of -1.8 on DEXA scan at [facility] 03/07/2013             (a) status post multiple dental extractions and implants             (b) repeat bone density at [facility] 04/02/2015 shows a T score of -2.0   METASTATIC DISEASE: April 2021 (lymph nodes, bone)   #10:  left upper extremity lymphedema led to chest CT scan 05/09/2019 showing a 5.1 cm left subpectoral chest wall mass and thoracic lymphadenopathy              (a) CT biopsy of the chest wall mass 05/25/2019 confirms recurrent breast cancer, strongly estrogen and  progesterone receptor positive, HER-2 not amplified             (b) PET scan 05/30/2019 shows a left subpectoral mass measuring 5.4 cm, with significant regional and mediastinal adenopathy, sclerotic left scapular metastasis, but no liver or lung involvement.             (c) CA 27-29 is moderately informative   #11 anastrozole started 06/02/2019; palbociclib added 06/08/2019             (a) palbociclib taken irregularly for several months secondary to cytopenias             (b) palbociclib dose decreased to 100 mg daily 21 on 7 off October 2021             (c) CT of the chest with contrast 11/09/2019 shows mild disease progression (despite a continuing drop in the CA 27-29)             (d) fulvestrant added 11/23/2019             (e) repeat chest CT with contrast 09/09/2020 read as stable.   #12 denosumab/Xgeva starting 06/08/2019, to be repeated every 3 months due to hypocalcemia             (a) treatment  held after August 2022 dose secondary to ongoing dental issues   #13 genetics testing 06/15/2019 through the [facility] Common Hereditary Cancers Panel found no deleterious mutations in APC, ATM, AXIN2, BARD1, BMPR1A, BRCA1, BRCA2, BRIP1, CDH1, CDKN2A (p14ARF), CDKN2A (p16INK4a), CKD4, CHEK2, CTNNA1, DICER1, EPCAM (Deletion/duplication testing only), GREM1 (promoter region deletion/duplication testing only), KIT, MEN1, MLH1, MSH2, MSH3, MSH6, MUTYH, NBN, NF1, NHTL1, PALB2, PDGFRA, PMS2, POLD1, POLE, PTEN, RAD50, RAD51C, RAD51D, RNF43, SDHB, SDHC, SDHD, SMAD4, SMARCA4. STK11, TP53, TSC1, TSC2, and VHL.  The following genes were evaluated for sequence changes only: SDHA and HOXB13 c.251G>A variant only.  CURRENT THERAPY: Anastrozole, Palbociclib, Fulvestrant  INTERVAL HISTORY:  Stacey [name] 81 y.o. female returns for follow-up follow-up of her metastatic breast cancer.   Since last visit, she denies any right hip pain. She still struggles with diarrhea, some days she has one and done, other days it  can be bad. PET imaging Sep 2023, no recurrent or met disease. Stable left sclerotic scapular metastasis. She is otherwise taking her Ibrance, her anastrozole and Faslodex as recommended.   Rest of the pertinent 10 point ROS reviewed and negative  Patient Active Problem List   Diagnosis Date Noted   Acute upper respiratory infection 05/16/2021   Nasal dryness 02/04/2021   Chronic sinusitis 11/25/2020   Allergic rhinitis 07/08/2020   Hypomagnesemia 06/10/2020   Goals of care, counseling/discussion 06/21/2019   Family history of breast cancer    Family history of pancreatic cancer    Family history of uterine cancer    Family history of lymphoma    Family history of lung cancer    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[facility]) 06/02/2019   Recurrent cancer of left breast ([place]) 05/15/2019   Pes anserine bursitis 03/31/2019   HTN (hypertension) 02/10/2019   Hyperglycemia 06/21/2018   Left carpal tunnel syndrome 02/22/2018   Rotator cuff arthropathy of left shoulder 09/20/2017   Arthritis of hand 08/23/2017   Pain in right hand 08/23/2017   Cervical radiculitis 04/09/2017   Left shoulder pain 04/09/2017   Dyspnea on exertion 05/14/2016   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   Lymphedema of left arm 01/14/2016   Left arm swelling 12/25/2015   SVT (supraventricular tachycardia)    Left-sided carotid artery disease ([facility]) 03/08/2015   Dizziness 01/24/2015   Urinary frequency 01/13/2015   Dizziness and giddiness 01/09/2015   Gallstones 02/21/2013   Malignant neoplasm of upper-inner quadrant of left breast in female, estrogen receptor positive ([place]) 11/28/2012   Muscle cramping 09/12/2012   Right hip pain 09/12/2012   Lower back pain 09/12/2012   COPD GOLD I     Hx of radiation therapy    Right shoulder pain 09/14/2011   Preventative health care 07/29/2010   Skin lesion of left leg 07/29/2010   Paresthesia 07/29/2010   GERD 03/28/2010   CONSTIPATION  03/28/2010   Osteoarthrosis, 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  MEDICAL HISTORY: Past Medical History:  Diagnosis Date   ANEMIA-NOS    ANXIETY    Breast cancer ([facility]) 1997 L, 2012 R   s/p chemo/xrt   COPD    resolved   DIVERTICULOSIS, COLON 2008   Dizziness    Family history of  breast cancer    Family history of lung cancer    Family history of lymphoma    Family history of pancreatic cancer    Family history of uterine cancer    GERD    Hx of radiation therapy 10/19/11 -12/03/11   right breast   HYPERLIPIDEMIA    IRRITABLE BOWEL SYNDROME, HX OF    Left-sided carotid artery disease (H[facility])    moderate left ICA stenosis   OSTEOARTHRITIS, HAND    PSVT (paroxysmal supraventricular tachycardia)    symptomatic on event monitor    SURGICAL HISTORY: Past Surgical History:  Procedure Laterality Date   ABDOMINAL HYSTERECTOMY     APPENDECTOMY     BREAST BIOPSY  11/14/10    r breast: inv, insitu mammary carcinoma w/calcif, er/pr +, her2 -   BREAST SURGERY     lumpectomy   CATARACT EXTRACTION     both eyes   ELECTROPHYSIOLOGIC STUDY N/A 05/10/2015   Procedure: SVT Ablation;  Surgeon: Will Meredith Leeds, MD;  Location: [facility];  Service: Cardiovascular;  Laterality: N/A;   HERNIA REPAIR     inguinal herniorrhapy  1984   left   IR US GUIDE BX ASP/DRAIN  05/26/2019   rectal fissure repair     s/p benign breast biopsy  2003   right   s/p left foot surgury  2009   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   spiral fx left foot  2008   no surgury   TMJ ARTHROPLASTY  1989   TONGUE SURGERY     1988- to remove scar tissue growth    TONSILLECTOMY      SOCIAL HISTORY: Social History    Socioeconomic History   Marital status: Divorced    Spouse name: 2 Step-children   Number of children: 2   Years of education: Not on file   Highest education level: Not on file  Occupational History   Occupation: retired Banker  Tobacco Use   Smoking status: Former    Packs/day: 1.00    Years: 54.00    Total pack years: 54.00    Types: Cigarettes    Quit date: 01/03/2016    Years since quitting: 5.9   Smokeless tobacco: Never  Vaping Use   Vaping Use: Never used  Substance and Sexual Activity   Alcohol use: Yes    Comment: rare/ drinks socially   Drug use: No   Sexual activity: Never  Other Topics Concern   Not on file  Social History Narrative   Patient gets no regular exercise   No biological children   2 step children   Social Determinants of Health   Financial Resource Strain: Low Risk  (12/02/2021)   Overall Financial Resource Strain (CARDIA)    Difficulty of Paying Living Expenses: Not hard at all  Food Insecurity: No Food Insecurity (12/02/2021)   Hunger Vital Sign    Worried About Running Out of Food in the Last Year: Never true    Ran Out of Food in the Last Year: Never true  Transportation Needs: No Transportation Needs (12/02/2021)   PRAPARE - Hydrologist (Medical): No    Lack of Transportation (Non-Medical): No  Physical Activity: Sufficiently Active (12/02/2021)   Exercise Vital Sign    Days of Exercise per Week: 5 days    Minutes of Exercise per Session: 30 min  Stress: No Stress Concern Present (12/02/2021)   [place] -  Occupational Stress Questionnaire    Feeling of Stress : Only a little  Social Connections: Socially Isolated (12/02/2021)   Social Connection and Isolation Panel [NHANES]    Frequency of Communication with Friends and Family: More than three times a week    Frequency of Social Gatherings with Friends and Family: More than three times a week    Attends Religious  Services: Never    Marine scientist or Organizations: No    Attends Archivist Meetings: Never    Marital Status: Divorced  Human resources officer Violence: Not At Risk (12/02/2021)   Humiliation, Afraid, Rape, and Kick questionnaire    Fear of Current or Ex-Partner: No    Emotionally Abused: No    Physically Abused: No    Sexually Abused: No    FAMILY HISTORY: Family History  Problem Relation Age of Onset   Hypertension Mother    Stroke Mother    Colon polyps Mother    Diabetes Mother    Pancreatic cancer Mother 91   Uterine cancer Mother 22   Lymphoma Brother        burkitts   Lung cancer Paternal Uncle    Lung cancer Maternal Grandmother 70       non-smoker   Lung cancer Maternal Grandfather    Breast cancer Cousin        maternal cousin, dx in her mid 7s   Brain cancer Cousin        maternal cousin's son; dx in his 5s   Testicular cancer Cousin        maternal cousin's son;    Breast cancer Cousin        paternal cousin; dx in her 17s   Breast cancer Cousin        paternal cousin's daughter; dx in 8s; neg genetic testing   Colon cancer Neg Hx    Esophageal cancer Neg Hx    Stomach cancer Neg Hx    Rectal cancer Neg Hx     Review of Systems  Constitutional:  Positive for fatigue. Negative for appetite change, chills, fever and unexpected weight change.  HENT:   Negative for hearing loss, lump/mass and trouble swallowing.   Eyes:  Negative for eye problems and icterus.  Respiratory:  Negative for chest tightness, cough and shortness of breath.   Cardiovascular:  Negative for chest pain, leg swelling and palpitations.  Gastrointestinal:  Positive for diarrhea. Negative for abdominal distention, abdominal pain, constipation, nausea and vomiting.  Endocrine: Negative for hot flashes.  Genitourinary:  Negative for difficulty urinating.   Musculoskeletal:  Positive for arthralgias.  Skin:  Negative for itching and rash.  Neurological:  Negative for  dizziness, extremity weakness, headaches and numbness.  Hematological:  Negative for adenopathy. Does not bruise/bleed easily.  Psychiatric/Behavioral:  Negative for depression. The patient is not nervous/anxious.       PHYSICAL EXAMINATION  ECOG PERFORMANCE STATUS: 1 - Symptomatic but completely ambulatory  Vitals:   12/19/21 1340  BP: 115/61  Pulse: 100  Resp: 18  Temp: 97.8 F (36.6 C)  SpO2: 95%    Physical Exam Constitutional:      Appearance: Normal appearance.  Cardiovascular:     Rate and Rhythm: Normal rate and regular rhythm.  Chest:     Comments: No concern on breast exam today Abdominal:     General: Abdomen is flat.     Palpations: Abdomen is soft.  Musculoskeletal:        General: No swelling  or tenderness.     Cervical back: Normal range of motion and neck supple. No rigidity.  Lymphadenopathy:     Cervical: No cervical adenopathy.  Skin:    General: Skin is warm and dry.  Neurological:     Mental Status: She is alert.  Psychiatric:        Mood and Affect: Mood normal.      LABORATORY DATA: Appointment on 12/19/2021  Component Date Value Ref Range Status   Magnesium 12/19/2021 1.4 (L)  1.7 - 2.4 mg/dL Final   Performed at [facility], [address] [place], [place] [address]   Sodium 12/19/2021 138  135 - 145 mmol/L Final   Potassium 12/19/2021 4.2  3.5 - 5.1 mmol/L Final   Chloride 12/19/2021 107  98 - 111 mmol/L Final   CO2 12/19/2021 21 (L)  22 - 32 mmol/L Final   Glucose, Bld 12/19/2021 122 (H)  70 - 99 mg/dL Final   Glucose reference range applies only to samples taken after fasting for at least 8 hours.   BUN 12/19/2021 34 (H)  8 - 23 mg/dL Final   Creatinine 12/19/2021 1.28 (H)  0.44 - 1.00 mg/dL Final   Calcium 12/19/2021 9.9  8.9 - 10.3 mg/dL Final   Total Protein 12/19/2021 8.2 (H)  6.5 - 8.1 g/dL Final   Albumin 12/19/2021 4.2  3.5 - 5.0 g/dL Final   AST 12/19/2021 15  15 - 41 U/L Final   ALT  12/19/2021 9  0 - 44 U/L Final   Alkaline Phosphatase 12/19/2021 68  38 - 126 U/L Final   Total Bilirubin 12/19/2021 0.3  0.3 - 1.2 mg/dL Final   GFR, Estimated 12/19/2021 42 (L)  >60 mL/min Final   Comment: (NOTE) Calculated using the CKD-EPI Creatinine Equation (2021)    Anion gap 12/19/2021 10  5 - 15 Final   Performed at [facility], [place] [address], [place], [place] [address]   WBC Count 12/19/2021 2.9 (L)  4.0 - 10.5 K/uL Final   RBC 12/19/2021 2.32 (L)  3.87 - 5.11 MIL/uL Final   Hemoglobin 12/19/2021 8.5 (L)  12.0 - 15.0 g/dL Final   HCT 12/19/2021 25.1 (L)  36.0 - 46.0 % Final   MCV 12/19/2021 108.2 (H)  80.0 - 100.0 fL Final   MCH 12/19/2021 36.6 (H)  26.0 - 34.0 pg Final   MCHC 12/19/2021 33.9  30.0 - 36.0 g/dL Final   RDW 12/19/2021 14.4  11.5 - 15.5 % Final   Platelet Count 12/19/2021 159  150 - 400 K/uL Final   nRBC 12/19/2021 0.0  0.0 - 0.2 % Final   Neutrophils Relative % 12/19/2021 55  % Final   Neutro Abs 12/19/2021 1.6 (L)  1.7 - 7.7 K/uL Final   Lymphocytes Relative 12/19/2021 22  % Final   Lymphs Abs 12/19/2021 0.6 (L)  0.7 - 4.0 K/uL Final   Monocytes Relative 12/19/2021 21  % Final   Monocytes Absolute 12/19/2021 0.6  0.1 - 1.0 K/uL Final   Eosinophils Relative 12/19/2021 1  % Final   Eosinophils Absolute 12/19/2021 0.0  0.0 - 0.5 K/uL Final   Basophils Relative 12/19/2021 1  % Final   Basophils Absolute 12/19/2021 0.0  0.0 - 0.1 K/uL Final   WBC Morphology 12/19/2021 RARE VARIANT LYMPHS SEEN   Final   Smear Review 12/19/2021 Normal platelet morphology   Final   Immature Granulocytes 12/19/2021 0  % Final   Abs  Immature Granulocytes 12/19/2021 0.01  0.00 - 0.07 K/uL Final   Ovalocytes 12/19/2021 PRESENT   Final   Performed at [facility], [place] [address], [place],  [address]     ASSESSMENT and THERAPY PLAN:   This is a very pleasant 81 year old female patient with metastatic breast cancer currently  on palbociclib, and anastrozole as well as fulvestrant since about May 2021 who is here for follow-up.   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 She continues on Ibrance/faslodex/anastrozole Last imaging in Sep with no concern for progression Labs consistent with Ibrance related cytopenia No concerns on exam today for progression.  She will continue Faslodex injection every 28 days  She continues on oral mag replacement.  She has severe hypomagnesemia at baseline.   We recommended 2 g of IV mag today since her magnesium levels of 1.4. We will plan to repeat imaging in February 2024 or sooner based on symptoms or if there is any clinical evidence of progression. Right hip pain resolved at this time.  All questions were answered. The patient knows to call the clinic with any problems, questions or concerns. We can certainly see the patient much sooner if necessary.  Total encounter time:30 minutes*in face-to-face visit time, chart review, lab review, care coordination, order entry, and documentation of the encounter time.   *Total Encounter Time as defined by the Centers for Medicare and Medicaid Services includes, in addition to the face-to-face time of a patient visit (documented in the note above) non-face-to-face time: obtaining and reviewing outside history, ordering and reviewing medications, tests or procedures, care coordination (communications with other health care professionals or caregivers) and documentation in the medical record.

## 2021-12-19 NOTE — Progress Notes (Signed)
This Pt presented to infusion today for Faslodex and IV magnesium. Pt's primary RN unable to obtain IV access with 2 attempts. IV team consult was placed. IV team attempted to gain IV access, but was unsuccessful. D/t Pt comfort and lack of venous availability no IV was established today.  This RN made Dr. Chryl Heck aware.  Dr. Chryl Heck OK with Pt not receiving IV magnesium today and recommended Pt continue taking oral magnesium this weekend and will follow up with Patient next week.  This RN educated Patient on MD's recommendations, Patient agreeable and verbalized understanding.

## 2021-12-20 ENCOUNTER — Other Ambulatory Visit: Payer: Self-pay | Admitting: Internal Medicine

## 2021-12-20 ENCOUNTER — Other Ambulatory Visit: Payer: Self-pay | Admitting: Hematology and Oncology

## 2021-12-20 LAB — CANCER ANTIGEN 27.29: CA 27.29: 42.6 U/mL — ABNORMAL HIGH (ref 0.0–38.6)

## 2021-12-20 NOTE — Telephone Encounter (Signed)
Please refill as per office routine med refill policy (all routine meds to be refilled for 3 mo or monthly (per pt preference) up to one year from last visit, then month to month grace period for 3 mo, then further med refills will have to be denied) ? ?

## 2021-12-22 ENCOUNTER — Encounter: Payer: Self-pay | Admitting: Physical Therapy

## 2021-12-22 ENCOUNTER — Ambulatory Visit: Payer: Medicare Other | Admitting: Physical Therapy

## 2021-12-22 DIAGNOSIS — C44619 Basal cell carcinoma of skin of left upper limb, including shoulder: Secondary | ICD-10-CM | POA: Diagnosis not present

## 2021-12-22 DIAGNOSIS — I890 Lymphedema, not elsewhere classified: Secondary | ICD-10-CM | POA: Diagnosis not present

## 2021-12-22 DIAGNOSIS — C44629 Squamous cell carcinoma of skin of left upper limb, including shoulder: Secondary | ICD-10-CM | POA: Diagnosis not present

## 2021-12-22 NOTE — Therapy (Signed)
OUTPATIENT PHYSICAL THERAPY ONCOLOGY TREATMENT  Patient Name: Stacey [name] MRN: [id number] DOB:1941-01-27, 81 y.o., female Today's Date: 12/22/2021   PT End of Session - 12/22/21 1406     Visit Number 6    Number of Visits 18    Date for PT Re-Evaluation 01/05/22    PT Start Time 1405    PT Stop Time 5284    PT Time Calculation (min) 53 min    Activity Tolerance Patient tolerated treatment well    Behavior During Therapy [facility] for tasks assessed/performed             Past Medical History:  Diagnosis Date   ANEMIA-NOS    ANXIETY    Breast cancer ([place]) 1997 L, 2012 R   s/p chemo/xrt   COPD    resolved   DIVERTICULOSIS, COLON 2008   Dizziness    Family history of breast cancer    Family history of lung cancer    Family history of lymphoma    Family history of pancreatic cancer    Family history of uterine cancer    GERD    Hx of radiation therapy 10/19/11 -12/03/11   right breast   HYPERLIPIDEMIA    IRRITABLE BOWEL SYNDROME, HX OF    Left-sided carotid artery disease (HCC)    moderate left ICA stenosis   OSTEOARTHRITIS, HAND    PSVT (paroxysmal supraventricular tachycardia)    symptomatic on event monitor   Past Surgical History:  Procedure Laterality Date   ABDOMINAL HYSTERECTOMY     APPENDECTOMY     BREAST BIOPSY  11/14/10    r breast: inv, insitu mammary carcinoma w/calcif, er/pr +, her2 -   BREAST SURGERY     lumpectomy   CATARACT EXTRACTION     both eyes   ELECTROPHYSIOLOGIC STUDY N/A 05/10/2015   Procedure: SVT Ablation;  Surgeon: Will Meredith Leeds, MD;  Location: [facility];  Service: Cardiovascular;  Laterality: N/A;   HERNIA REPAIR     inguinal herniorrhapy  1984   left   IR US GUIDE BX ASP/DRAIN  05/26/2019   rectal fissure repair     s/p benign breast biopsy  2003   right   s/p left foot surgury  2009   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   spiral fx left foot  2008   no surgury   TMJ ARTHROPLASTY  1989   TONGUE SURGERY      1988- to remove scar tissue growth    TONSILLECTOMY     Patient Active Problem List   Diagnosis Date Noted   Acute upper respiratory infection 05/16/2021   Nasal dryness 02/04/2021   Chronic sinusitis 11/25/2020   Allergic rhinitis 07/08/2020   Hypomagnesemia 06/10/2020   Goals of care, counseling/discussion 06/21/2019   Family history of breast cancer    Family history of pancreatic cancer    Family history of uterine cancer    Family history of lymphoma    Family history of lung cancer    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[place]) 06/02/2019   Recurrent cancer of left breast ([place]) 05/15/2019   Pes anserine bursitis 03/31/2019   HTN (hypertension) 02/10/2019   Hyperglycemia 06/21/2018   Left carpal tunnel syndrome 02/22/2018   Rotator cuff arthropathy of left shoulder 09/20/2017   Arthritis of hand 08/23/2017   Pain in right hand 08/23/2017   Cervical radiculitis 04/09/2017   Left shoulder pain 04/09/2017   Dyspnea on exertion 05/14/2016  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   Lymphedema of left arm 01/14/2016   Left arm swelling 12/25/2015   SVT (supraventricular tachycardia)    Left-sided carotid artery disease ([place]) 03/08/2015   Dizziness 01/24/2015   Urinary frequency 01/13/2015   Dizziness and giddiness 01/09/2015   Gallstones 02/21/2013   Malignant neoplasm of upper-inner quadrant of left breast in female, estrogen receptor positive ([place]) 11/28/2012   Muscle cramping 09/12/2012   Right hip pain 09/12/2012   Lower back pain 09/12/2012   COPD GOLD I     Hx of radiation therapy    Right shoulder pain 09/14/2011   Preventative health care 07/29/2010   Skin lesion of left leg 07/29/2010   Paresthesia 07/29/2010   GERD 03/28/2010   CONSTIPATION 03/28/2010   Osteoarthrosis, 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   Anxiety state 05/03/2009   Hyperlipidemia 06/14/2007   FATIGUE 06/14/2007   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  Rationale for Evaluation and Treatment Rehabilitation  SUBJECTIVE:                                                                                                                                                                                          SUBJECTIVE STATEMENT: I washed my bandages and have been wearing the tribute since then.  PERTINENT HISTORY:  Hx breast cancer with 15 lymph node removal on the L and Bil lumpectomy. Lumpectomy on the L in 1997and the R in 2012. Radiation Bil.  New discovery of metastatic breast Cancer presently on Palbocidib and Anastrozole,  Fulvestrant since 06/2019.Recent PET scan on 10/09/2021 showed no evidence of recurrent or metastatic disease. Prior lymphedema treatment for Left UE in 2021  PAIN:  Are you having pain? No pain today NPRS scale: pt did not rate on scale Pain location: wrist and hand on left  Pain orientation: Left  PAIN TYPE: stabbing Pain description: intermittent  Aggravating factors: evening Relieving factors: Tribute wrap but doesn't wear the hand piece.  PRECAUTIONS: Other: Left UE lymphedema , Metastatic breast Cancer, COPD  WEIGHT BEARING RESTRICTIONS: No  FALLS:  Has patient fallen in last 6 months? No  LIVING ENVIRONMENT: Lives with: lives alone Lives in: House/apartment Stairs: No; External: 1 steps; on right going up Has following  equipment at home: None  OCCUPATION: retired  LEISURE: reading, puzzles, walks very little  HAND DOMINANCE: right   PRIOR LEVEL OF FUNCTION: Independent  PATIENT GOALS: Try and getting swelling in left arm down. Get a  new compression sleeve that fits properly   OBJECTIVE:  COGNITION: Overall cognitive status: Within functional limits for tasks assessed   PALPATION: Mild pitting edema ulnar border of Left UE  OBSERVATIONS / OTHER ASSESSMENTS: Bandaid on forearm with small amount of clear  drainage showing through. Other aspect of incision that can be viewed is healed. Sensation to light touch in the hand is intact. Dorsum of hand is slightly swollen, and upper arm swollen above where she had TG soft folded over and with bottom folded over several inches, may be making hand swell. She complains of left wrist pain worse at night and has a weak left grip  SENSATION: Light touch: Appears intact throughout left UE   POSTURE: forward head, rounded shoulders  UPPER EXTREMITY AROM/PROM:  A/PROM RIGHT   eval   Shoulder extension   Shoulder flexion   Shoulder abduction   Shoulder internal rotation   Shoulder external rotation     (Blank rows = not tested)  A/PROM LEFT   eval  Shoulder extension   Shoulder flexion   Shoulder abduction   Shoulder internal rotation   Shoulder external rotation     (Blank rows = not tested)  CERVICAL AROM: All within functional limits:   UPPER EXTREMITY STRENGTH: NT  LYMPHEDEMA ASSESSMENTS:   SURGERY TYPE/DATE: Left breast Lumpectomy 1997, Right lumpectomy 2012  NUMBER OF LYMPH NODES REMOVED: 15 on left UE  CHEMOTHERAPY:Yes, presently on medications for metastasis  RADIATION:bilateral   HORMONE TREATMENT: yes  INFECTIONS: yes  LYMPHEDEMA ASSESSMENTS:   LANDMARK RIGHT  eval  10 cm proximal to olecranon process 27.5  Olecranon process 22.4  10 cm proximal to ulnar styloid process 19.0  Just proximal to ulnar styloid process 15.2  Across hand at thumb web space 17.9  At base of 2nd digit 6.3  (Blank rows = not tested)  LANDMARK LEFT  eval LEFT 12/18/2021  10 cm proximal to olecranon process 28.2 29.0  Olecranon process 27.5 25.3  10 cm  proximal to ulnar styloid process 24.0 21  Just proximal to ulnar styloid process 18.2 17.7  Across hand at thumb web space 18.5 18.0  At base of 2nd digit 6.5 6.3  (Blank rows = not tested)      LLIS : 47%   TODAY'S TREATMENT:                                                                                                                           DATE 12/22/21 Manual lymph drainage in supine as follows: short neck, (no diaphragmatic breathing due to shortness of breath from medication) , left inguinal nodes, left axillo-inguinal anastamoses. Left upper extremity working proximal to distal then retracing all steps being very careful around area on  forearm where pt recently had surgery. Compression bandaging:  TG soft from hand to axilla, mollelast to digits 1-5 then covered with paper tape, artiflex from hand to axilla, 1 6cm bandage at hand, 1 8 cm bandage from wrist to axilla in herringbone, 1 12 cm bandage from hand to axilla with spiral.    12/19/2021  Pt measured Manual lymph drainage in supine as follows: short neck, (no diaphragmatic breathing due to shortness of breath from medication) , left inguinal nodes, left axillo-inguinal anastamoses. Left upper extremity working proximal to distal then retracing all steps being very careful around area on forearm where pt recently had surgery. Compression bandaging:  TG soft from hand to axilla, mollelast to digits 1-5, artiflex from hand to axilla, 1 6cm bandage at hand, 1 8 cm bandage from wrist to axilla in spiral, 1 12 cm bandage from hand to axilla with spiral.   Pt educated to try and wash wraps on Sunday evening and lay on rack to dry. Wear Tribute at night and in the am before coming to therapy later that day.  12/16/2021 Manual lymph drainage in supine as follows: short neck, (no diaphragmatic breathing due to shortness of breath from medication) , left inguinal nodes, left axillo-inguinal anastamoses. Left upper extremity working  proximal to distal then retracing all steps being very careful around area on forearm where pt recently had surgery. Compression bandaging:  TG soft from hand to axilla, mollelast to digits 1-5, artiflex from hand to axilla, 1 6cm bandage at hand, 1 8 cm bandage from wrist to axilla in spiral, 1 12 cm bandage from hand to axilla with spiral.   Initially wrapped 8 cm wrap with herringbone with 10 cm spiral over it but was afraid it would be too tight sow rewrapped both in spiral.   12/11/2021 Measured pt at 10 cm above ulna styloid and pt had a 2 cm reduction Manual lymph drainage in supine as follows: short neck, (no diaphragmatic breathing due to shortness of breath from medication) , left inguinal nodes, left axillo-inguinal anastamoses. Left upper extremity working proximal to distal then retracing all steps being very careful around area on forearm where pt recently had surgery. Compression bandaging: TG soft from hand to axilla, mollelast to digits 1-5, artiflex from hand to axilla, 1 6cm bandage at hand, 1 8 cm bandage from wrist to axilla in herringbone, 1 12 cm bandage from hand to axilla. Pt advised 4 days is a long time to wear wraps. If they slide down or become uncomfortable she must remove. She may try to get her appts Mon-Thurs. Instead of Tues Thurs.  12/09/21: Manual lymph drainage in supine as follows: short neck, (no diaphragmatic breathing due to shortness of breath from medication) , left inguinal nodes, left axillo-inguinal anastamoses. Left upper extremity working proximal to distal then retracing all steps being very careful around area on forearm where pt recently had surgery. Compression bandaging: TG soft from hand to axilla, mollelast to digits 1-5, artiflex from hand to axilla, 1 6cm bandage at hand, 1 8 cm bandage from wrist to axilla in herringbone, 1 12 cm bandage from hand to axilla  Eval: Discussed POC and wrapping with pt. Pt is agreeable to try wrapping for 2 weeks to  see if her arm would reduce. Swelling is soft and with compliance should reduce so she can be fit for a new sleeve. Discussed caution folding over TG soft too much because of increasing the pressure possibly increasing swelling at hand  and at upper arm.Marland Kitchen PATIENT EDUCATION:  Education details: discussed POC, bandaging, new garments, need for 3x/week, instruct family member for weekends if she would like. Person educated: Patient Education method: Explanation Education comprehension: verbalized understanding  HOME EXERCISE PROGRAM: None given today  ASSESSMENT:  CLINICAL IMPRESSION: Pt continues to demonstrate a decrease in edema throughout LUE per visual estimate and is demonstrating less fibrosis at forearm. She still has some fibrosis very close to her scar and was able to tolerate MLD closer to the scar today. She has less sensitivity in this area today. Will continue CDT until pt reaches maximal reduction and then she will be measured for compression garments.   OBJECTIVE IMPAIRMENTS: decreased activity tolerance, decreased knowledge of condition, decreased ROM, increased edema, impaired UE functional use, postural dysfunction, and pain.   ACTIVITY LIMITATIONS: lifting, dressing, and reach over head  PARTICIPATION LIMITATIONS: cleaning, yard work, and reaching activities  PERSONAL FACTORS: 3+ comorbidities: Breast Cancer s/p radiation and with metastasis, COPD, OA, current cancer meds  are also affecting patient's functional outcome.   REHAB POTENTIAL: Good  CLINICAL DECISION MAKING: Stable/uncomplicated  EVALUATION COMPLEXITY: Low  GOALS: Goals reviewed with patient? Yes  SHORT TERM GOALS: Target date: 01/12/2022    Pt will be tolerant of compression bandaging Baseline: Goal status: MET  12/18/2021 2.  Pt will have reduction at olecranon and 10 cm prox to ulna by 1.5 cm Baseline:  Goal status: MET 12/18/2021  LONG TERM GOALS: Target date: 02/02/2022    Pt will have  decreased edema at olecranon and 10 cm prox to ulna styloid by 2.5 cm Baseline:  Goal status: INITIAL  2.  Pt will be fit for appropriate compression sleeve Baseline:  Goal status: INITIAL  3.  Pt will be independent with self care of lymphedema including day and night compression Baseline:  Goal status: INITIAL  4.  Pt will improve LLIS to no greater than 30% to demonstrate improved function  Baseline:  Goal status: INITIAL    PLAN:  PT FREQUENCY: 3x/week  PT DURATION: 6 weeks  PLANNED INTERVENTIONS: Therapeutic exercises, Patient/Family education, Self Care, Orthotic/Fit training, Manual lymph drainage, Compression bandaging, and Manual therapy  PLAN FOR NEXT SESSION:  Measure next visit,check shoulder ROM? Make goal prn, how were bandages?, continue CDT. Bandage 2 weeks and if not reducing discontinue, otherwise continue until plateau then fit for new garments.   Allyson Sabal Glasford, PT 12/22/2021, 3:58 PM

## 2021-12-22 NOTE — Telephone Encounter (Signed)
Oral Oncology Patient Advocate Encounter   Met with patient in lobby to collect paperwork requested by [employer] [facility] on 11/ 17/23.  Application will be submitted once MD signatures are complete.    [employer] phone number [phone].   I will continue to check the status until final determination.   Lady Deutscher, CPhT-Adv Oncology Pharmacy Patient [place] Direct Number: [phone]  Fax: [phone]

## 2021-12-24 ENCOUNTER — Ambulatory Visit: Payer: Medicare Other

## 2021-12-24 DIAGNOSIS — C44629 Squamous cell carcinoma of skin of left upper limb, including shoulder: Secondary | ICD-10-CM | POA: Diagnosis not present

## 2021-12-24 DIAGNOSIS — C44619 Basal cell carcinoma of skin of left upper limb, including shoulder: Secondary | ICD-10-CM

## 2021-12-24 DIAGNOSIS — I890 Lymphedema, not elsewhere classified: Secondary | ICD-10-CM

## 2021-12-24 NOTE — Therapy (Signed)
OUTPATIENT PHYSICAL THERAPY ONCOLOGY TREATMENT  Patient Name: Stacey [name] MRN: [id number] DOB:1940/11/14, 81 y.o., female Today's Date: 12/24/2021   PT End of Session - 12/24/21 1202     Visit Number 7    Number of Visits 18    Date for PT Re-Evaluation 01/05/22    PT Start Time 1202    PT Stop Time 1252    PT Time Calculation (min) 50 min    Activity Tolerance Patient tolerated treatment well    Behavior During Therapy [facility] for tasks assessed/performed             Past Medical History:  Diagnosis Date   ANEMIA-NOS    ANXIETY    Breast cancer ([place]) 1997 L, 2012 R   s/p chemo/xrt   COPD    resolved   DIVERTICULOSIS, COLON 2008   Dizziness    Family history of breast cancer    Family history of lung cancer    Family history of lymphoma    Family history of pancreatic cancer    Family history of uterine cancer    GERD    Hx of radiation therapy 10/19/11 -12/03/11   right breast   HYPERLIPIDEMIA    IRRITABLE BOWEL SYNDROME, HX OF    Left-sided carotid artery disease (HCC)    moderate left ICA stenosis   OSTEOARTHRITIS, HAND    PSVT (paroxysmal supraventricular tachycardia)    symptomatic on event monitor   Past Surgical History:  Procedure Laterality Date   ABDOMINAL HYSTERECTOMY     APPENDECTOMY     BREAST BIOPSY  11/14/10    r breast: inv, insitu mammary carcinoma w/calcif, er/pr +, her2 -   BREAST SURGERY     lumpectomy   CATARACT EXTRACTION     both eyes   ELECTROPHYSIOLOGIC STUDY N/A 05/10/2015   Procedure: SVT Ablation;  Surgeon: Will Meredith Leeds, MD;  Location: [facility];  Service: Cardiovascular;  Laterality: N/A;   HERNIA REPAIR     inguinal herniorrhapy  1984   left   IR US GUIDE BX ASP/DRAIN  05/26/2019   rectal fissure repair     s/p benign breast biopsy  2003   right   s/p left foot surgury  2009   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   spiral fx left foot  2008   no surgury   TMJ ARTHROPLASTY  1989   TONGUE SURGERY      1988- to remove scar tissue growth    TONSILLECTOMY     Patient Active Problem List   Diagnosis Date Noted   Acute upper respiratory infection 05/16/2021   Nasal dryness 02/04/2021   Chronic sinusitis 11/25/2020   Allergic rhinitis 07/08/2020   Hypomagnesemia 06/10/2020   Goals of care, counseling/discussion 06/21/2019   Family history of breast cancer    Family history of pancreatic cancer    Family history of uterine cancer    Family history of lymphoma    Family history of lung cancer    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[place]) 06/02/2019   Recurrent cancer of left breast ([place]) 05/15/2019   Pes anserine bursitis 03/31/2019   HTN (hypertension) 02/10/2019   Hyperglycemia 06/21/2018   Left carpal tunnel syndrome 02/22/2018   Rotator cuff arthropathy of left shoulder 09/20/2017   Arthritis of hand 08/23/2017   Pain in right hand 08/23/2017   Cervical radiculitis 04/09/2017   Left shoulder pain 04/09/2017   Dyspnea on exertion 05/14/2016  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   Lymphedema of left arm 01/14/2016   Left arm swelling 12/25/2015   SVT (supraventricular tachycardia)    Left-sided carotid artery disease ([place]) 03/08/2015   Dizziness 01/24/2015   Urinary frequency 01/13/2015   Dizziness and giddiness 01/09/2015   Gallstones 02/21/2013   Malignant neoplasm of upper-inner quadrant of left breast in female, estrogen receptor positive ([place]) 11/28/2012   Muscle cramping 09/12/2012   Right hip pain 09/12/2012   Lower back pain 09/12/2012   COPD GOLD I     Hx of radiation therapy    Right shoulder pain 09/14/2011   Preventative health care 07/29/2010   Skin lesion of left leg 07/29/2010   Paresthesia 07/29/2010   GERD 03/28/2010   CONSTIPATION 03/28/2010   Osteoarthrosis, 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   Anxiety state 05/03/2009   Hyperlipidemia 06/14/2007   FATIGUE 06/14/2007   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  Rationale for Evaluation and Treatment Rehabilitation  SUBJECTIVE:                                                                                                                                                                                          SUBJECTIVE STATEMENT: I took the wrap off a couple hours ago  PERTINENT HISTORY:  Hx breast cancer with 15 lymph node removal on the L and Bil lumpectomy. Lumpectomy on the L in 1997and the R in 2012. Radiation Bil.  New discovery of metastatic breast Cancer presently on Palbocidib and Anastrozole,  Fulvestrant since 06/2019.Recent PET scan on 10/09/2021 showed no evidence of recurrent or metastatic disease. Prior lymphedema treatment for Left UE in 2021  PAIN:  Are you having pain? No pain today NPRS scale: pt did not rate on scale Pain location: wrist and hand on left  Pain orientation: Left  PAIN TYPE: stabbing Pain description: intermittent  Aggravating factors: evening Relieving factors: Tribute wrap but doesn't wear the hand piece.  PRECAUTIONS: Other: Left UE lymphedema , Metastatic breast Cancer, COPD  WEIGHT BEARING RESTRICTIONS: No  FALLS:  Has patient fallen in last 6 months? No  LIVING ENVIRONMENT: Lives with: lives alone Lives in: House/apartment Stairs: No; External: 1 steps; on right going up Has following equipment at home:  None  OCCUPATION: retired  LEISURE: reading, puzzles, walks very little  HAND DOMINANCE: right   PRIOR LEVEL OF FUNCTION: Independent  PATIENT GOALS: Try and getting swelling in left arm down. Get a new compression sleeve that  fits properly   OBJECTIVE:  COGNITION: Overall cognitive status: Within functional limits for tasks assessed   PALPATION: Mild pitting edema ulnar border of Left UE  OBSERVATIONS / OTHER ASSESSMENTS: Bandaid on forearm with small amount of clear  drainage showing through. Other aspect of incision that can be viewed is healed. Sensation to light touch in the hand is intact. Dorsum of hand is slightly swollen, and upper arm swollen above where she had TG soft folded over and with bottom folded over several inches, may be making hand swell. She complains of left wrist pain worse at night and has a weak left grip  SENSATION: Light touch: Appears intact throughout left UE   POSTURE: forward head, rounded shoulders  UPPER EXTREMITY AROM/PROM:  A/PROM RIGHT   eval   Shoulder extension   Shoulder flexion   Shoulder abduction   Shoulder internal rotation   Shoulder external rotation     (Blank rows = not tested)  A/PROM LEFT   eval  Shoulder extension   Shoulder flexion   Shoulder abduction   Shoulder internal rotation   Shoulder external rotation     (Blank rows = not tested)  CERVICAL AROM: All within functional limits:   UPPER EXTREMITY STRENGTH: NT  LYMPHEDEMA ASSESSMENTS:   SURGERY TYPE/DATE: Left breast Lumpectomy 1997, Right lumpectomy 2012  NUMBER OF LYMPH NODES REMOVED: 15 on left UE  CHEMOTHERAPY:Yes, presently on medications for metastasis  RADIATION:bilateral   HORMONE TREATMENT: yes  INFECTIONS: yes  LYMPHEDEMA ASSESSMENTS:   LANDMARK RIGHT  eval  10 cm proximal to olecranon process 27.5  Olecranon process 22.4  10 cm proximal to ulnar styloid process 19.0  Just proximal to ulnar styloid process 15.2  Across hand at thumb web space 17.9  At base of 2nd digit 6.3  (Blank rows = not tested)  LANDMARK LEFT  eval LEFT 12/18/2021 LEFT 12/24/2021  10 cm proximal to olecranon process 28.2 29.0 28.6  Olecranon process 27.5 25.3 25.6  10 cm  proximal to ulnar styloid process 24.0 21 21.6  Just proximal to ulnar styloid process 18.2 17.7 18.2  Across hand at thumb web space 18.5 18.0 18.3  At base of 2nd digit 6.5 6.3 6.3  (Blank rows = not tested)      LLIS : 47%   TODAY'S TREATMENT:                                                                                                                           DATE 12/24/2021 Pt measured with slight increases throughout Manual lymph drainage in supine as follows: short neck, (no diaphragmatic breathing due to shortness of breath from medication) , left inguinal nodes, left axillo-inguinal anastamoses. Left upper extremity working proximal to  distal then retracing all steps being very careful around area on forearm where pt recently had surgery. Compression bandaging: New TG soft from hand to axilla, mollelast to digits 1-5 then , artiflex from hand to axilla, 1 6cm bandage at hand, 1 8 cm bandage from wrist to axilla in spiral, 1 12 cm bandage from hand to axilla with spiral. Checked capillary refill and was good  12/22/21 Manual lymph drainage in supine as follows: short neck, (no diaphragmatic breathing due to shortness of breath from medication) , left inguinal nodes, left axillo-inguinal anastamoses. Left upper extremity working proximal to distal then retracing all steps being very careful around area on forearm where pt recently had surgery. Compression bandaging:  TG soft from hand to axilla, mollelast to digits 1-5 then covered with paper tape, artiflex from hand to axilla, 1 6cm bandage at hand, 1 8 cm bandage from wrist to axilla in herringbone, 1 12 cm bandage from hand to axilla with spiral.    12/19/2021  Pt measured Manual lymph drainage in supine as follows: short neck, (no diaphragmatic breathing due to shortness of breath from medication) , left inguinal nodes, left axillo-inguinal anastamoses. Left upper extremity working proximal to distal then retracing all steps  being very careful around area on forearm where pt recently had surgery. Compression bandaging:  TG soft from hand to axilla, mollelast to digits 1-5, artiflex from hand to axilla, 1 6cm bandage at hand, 1 8 cm bandage from wrist to axilla in spiral, 1 12 cm bandage from hand to axilla with spiral.   Pt educated to try and wash wraps on Sunday evening and lay on rack to dry. Wear Tribute at night and in the am before coming to therapy later that day.  12/16/2021 Manual lymph drainage in supine as follows: short neck, (no diaphragmatic breathing due to shortness of breath from medication) , left inguinal nodes, left axillo-inguinal anastamoses. Left upper extremity working proximal to distal then retracing all steps being very careful around area on forearm where pt recently had surgery. Compression bandaging:  TG soft from hand to axilla, mollelast to digits 1-5, artiflex from hand to axilla, 1 6cm bandage at hand, 1 8 cm bandage from wrist to axilla in spiral, 1 12 cm bandage from hand to axilla with spiral.   Initially wrapped 8 cm wrap with herringbone with 10 cm spiral over it but was afraid it would be too tight sow rewrapped both in spiral.   12/11/2021 Measured pt at 10 cm above ulna styloid and pt had a 2 cm reduction Manual lymph drainage in supine as follows: short neck, (no diaphragmatic breathing due to shortness of breath from medication) , left inguinal nodes, left axillo-inguinal anastamoses. Left upper extremity working proximal to distal then retracing all steps being very careful around area on forearm where pt recently had surgery. Compression bandaging: TG soft from hand to axilla, mollelast to digits 1-5, artiflex from hand to axilla, 1 6cm bandage at hand, 1 8 cm bandage from wrist to axilla in herringbone, 1 12 cm bandage from hand to axilla. Pt advised 4 days is a long time to wear wraps. If they slide down or become uncomfortable she must remove. She may try to get her appts  Mon-Thurs. Instead of Tues Thurs.  12/09/21: Manual lymph drainage in supine as follows: short neck, (no diaphragmatic breathing due to shortness of breath from medication) , left inguinal nodes, left axillo-inguinal anastamoses. Left upper extremity working proximal to distal then retracing  all steps being very careful around area on forearm where pt recently had surgery. Compression bandaging: TG soft from hand to axilla, mollelast to digits 1-5, artiflex from hand to axilla, 1 6cm bandage at hand, 1 8 cm bandage from wrist to axilla in herringbone, 1 12 cm bandage from hand to axilla  Eval: Discussed POC and wrapping with pt. Pt is agreeable to try wrapping for 2 weeks to see if her arm would reduce. Swelling is soft and with compliance should reduce so she can be fit for a new sleeve. Discussed caution folding over TG soft too much because of increasing the pressure possibly increasing swelling at hand and at upper arm.Marland Kitchen PATIENT EDUCATION:  Education details: discussed POC, bandaging, new garments, need for 3x/week, instruct family member for weekends if she would like. Person educated: Patient Education method: Explanation Education comprehension: verbalized understanding  HOME EXERCISE PROGRAM: None given today  ASSESSMENT:  CLINICAL IMPRESSION: Pt with all but one measurement up slightly from last measurements, howver, last measurements wraps were removed in clinic and today she came in without wraps. Continue 1 more week and reassess. May be ready for garments soon.  OBJECTIVE IMPAIRMENTS: decreased activity tolerance, decreased knowledge of condition, decreased ROM, increased edema, impaired UE functional use, postural dysfunction, and pain.   ACTIVITY LIMITATIONS: lifting, dressing, and reach over head  PARTICIPATION LIMITATIONS: cleaning, yard work, and reaching activities  PERSONAL FACTORS: 3+ comorbidities: Breast Cancer s/p radiation and with metastasis, COPD, OA, current  cancer meds  are also affecting patient's functional outcome.   REHAB POTENTIAL: Good  CLINICAL DECISION MAKING: Stable/uncomplicated  EVALUATION COMPLEXITY: Low  GOALS: Goals reviewed with patient? Yes  SHORT TERM GOALS: Target date: 01/14/2022    Pt will be tolerant of compression bandaging Baseline: Goal status: MET  12/18/2021 2.  Pt will have reduction at olecranon and 10 cm prox to ulna by 1.5 cm Baseline:  Goal status: MET 12/18/2021  LONG TERM GOALS: Target date: 02/04/2022    Pt will have decreased edema at olecranon and 10 cm prox to ulna styloid by 2.5 cm Baseline:  Goal status: INITIAL  2.  Pt will be fit for appropriate compression sleeve Baseline:  Goal status: INITIAL  3.  Pt will be independent with self care of lymphedema including day and night compression Baseline:  Goal status: INITIAL  4.  Pt will improve LLIS to no greater than 30% to demonstrate improved function  Baseline:  Goal status: INITIAL    PLAN:  PT FREQUENCY: 3x/week  PT DURATION: 6 weeks  PLANNED INTERVENTIONS: Therapeutic exercises, Patient/Family education, Self Care, Orthotic/Fit training, Manual lymph drainage, Compression bandaging, and Manual therapy  PLAN FOR NEXT SESSION:  Measure next visit,check shoulder ROM? Make goal prn, how were bandages?, continue CDT. Bandage 2 weeks and if not reducing discontinue, otherwise continue until plateau then fit for new garments.   Claris Pong, PT 12/24/2021, 1:00 PM

## 2021-12-29 ENCOUNTER — Encounter: Payer: Self-pay | Admitting: Physical Therapy

## 2021-12-29 ENCOUNTER — Ambulatory Visit: Payer: Medicare Other | Admitting: Physical Therapy

## 2021-12-29 DIAGNOSIS — I890 Lymphedema, not elsewhere classified: Secondary | ICD-10-CM

## 2021-12-29 DIAGNOSIS — C44629 Squamous cell carcinoma of skin of left upper limb, including shoulder: Secondary | ICD-10-CM

## 2021-12-29 DIAGNOSIS — C44619 Basal cell carcinoma of skin of left upper limb, including shoulder: Secondary | ICD-10-CM

## 2021-12-29 NOTE — Therapy (Signed)
OUTPATIENT PHYSICAL THERAPY ONCOLOGY TREATMENT  Patient Name: Stacey [name] MRN: [id number] DOB:20-Jun-1940, 81 y.o., female Today's Date: 12/29/2021   PT End of Session - 12/29/21 1405     Visit Number 8    Number of Visits 18    Date for PT Re-Evaluation 01/05/22    PT Start Time 1403    Activity Tolerance Patient tolerated treatment well    Behavior During Therapy [facility] for tasks assessed/performed             Past Medical History:  Diagnosis Date   ANEMIA-NOS    ANXIETY    Breast cancer ([place]) 1997 L, 2012 R   s/p chemo/xrt   COPD    resolved   DIVERTICULOSIS, COLON 2008   Dizziness    Family history of breast cancer    Family history of lung cancer    Family history of lymphoma    Family history of pancreatic cancer    Family history of uterine cancer    GERD    Hx of radiation therapy 10/19/11 -12/03/11   right breast   HYPERLIPIDEMIA    IRRITABLE BOWEL SYNDROME, HX OF    Left-sided carotid artery disease (HCC)    moderate left ICA stenosis   OSTEOARTHRITIS, HAND    PSVT (paroxysmal supraventricular tachycardia)    symptomatic on event monitor   Past Surgical History:  Procedure Laterality Date   ABDOMINAL HYSTERECTOMY     APPENDECTOMY     BREAST BIOPSY  11/14/10    r breast: inv, insitu mammary carcinoma w/calcif, er/pr +, her2 -   BREAST SURGERY     lumpectomy   CATARACT EXTRACTION     both eyes   ELECTROPHYSIOLOGIC STUDY N/A 05/10/2015   Procedure: SVT Ablation;  Surgeon: Will Meredith Leeds, MD;  Location: [facility];  Service: Cardiovascular;  Laterality: N/A;   HERNIA REPAIR     inguinal herniorrhapy  1984   left   IR US GUIDE BX ASP/DRAIN  05/26/2019   rectal fissure repair     s/p benign breast biopsy  2003   right   s/p left foot surgury  2009   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   spiral fx left foot  2008   no surgury   TMJ ARTHROPLASTY  1989   TONGUE SURGERY     1988- to remove scar tissue growth    TONSILLECTOMY      Patient Active Problem List   Diagnosis Date Noted   Acute upper respiratory infection 05/16/2021   Nasal dryness 02/04/2021   Chronic sinusitis 11/25/2020   Allergic rhinitis 07/08/2020   Hypomagnesemia 06/10/2020   Goals of care, counseling/discussion 06/21/2019   Family history of breast cancer    Family history of pancreatic cancer    Family history of uterine cancer    Family history of lymphoma    Family history of lung cancer    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[place]) 06/02/2019   Recurrent cancer of left breast ([place]) 05/15/2019   Pes anserine bursitis 03/31/2019   HTN (hypertension) 02/10/2019   Hyperglycemia 06/21/2018   Left carpal tunnel syndrome 02/22/2018   Rotator cuff arthropathy of left shoulder 09/20/2017   Arthritis of hand 08/23/2017   Pain in right hand 08/23/2017   Cervical radiculitis 04/09/2017   Left shoulder pain 04/09/2017   Dyspnea on exertion 05/14/2016   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   Lymphedema of left arm  01/14/2016   Left arm swelling 12/25/2015   SVT (supraventricular tachycardia)    Left-sided carotid artery disease ([place]) 03/08/2015   Dizziness 01/24/2015   Urinary frequency 01/13/2015   Dizziness and giddiness 01/09/2015   Gallstones 02/21/2013   Malignant neoplasm of upper-inner quadrant of left breast in female, estrogen receptor positive ([place]) 11/28/2012   Muscle cramping 09/12/2012   Right hip pain 09/12/2012   Lower back pain 09/12/2012   COPD GOLD I     Hx of radiation therapy    Right shoulder pain 09/14/2011   Preventative health care 07/29/2010   Skin lesion of left leg 07/29/2010   Paresthesia 07/29/2010   GERD 03/28/2010   CONSTIPATION 03/28/2010   Osteoarthrosis, 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   Anxiety state 05/03/2009   Hyperlipidemia 06/14/2007   FATIGUE 06/14/2007   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  Rationale for Evaluation and Treatment Rehabilitation  SUBJECTIVE:                                                                                                                                                                                          SUBJECTIVE STATEMENT: I took the wrap off around 11 am.   PERTINENT HISTORY:  Hx breast cancer with 15 lymph node removal on the L and Bil lumpectomy. Lumpectomy on the L in 1997and the R in 2012. Radiation Bil.  New discovery of metastatic breast Cancer presently on Palbocidib and Anastrozole,  Fulvestrant since 06/2019.Recent PET scan on 10/09/2021 showed no evidence of recurrent or metastatic disease. Prior lymphedema treatment for Left UE in 2021  PAIN:  Are you having pain? No pain today NPRS scale: pt did not rate on scale Pain location: wrist and hand on left  Pain orientation: Left  PAIN TYPE: stabbing Pain description: intermittent  Aggravating factors: evening Relieving factors: Tribute wrap but doesn't wear the hand piece.  PRECAUTIONS: Other: Left UE lymphedema , Metastatic breast Cancer, COPD  WEIGHT BEARING RESTRICTIONS: No  FALLS:  Has patient fallen in last 6 months? No  LIVING ENVIRONMENT: Lives with: lives alone Lives in: House/apartment Stairs: No; External: 1 steps; on right going up Has following equipment at home: None  OCCUPATION: retired  LEISURE: reading, puzzles, walks very little  HAND DOMINANCE: right  PRIOR LEVEL OF FUNCTION: Independent  PATIENT GOALS: Try and getting swelling in left arm down. Get a new compression sleeve that fits properly   OBJECTIVE:  COGNITION: Overall cognitive  status: Within functional limits for tasks assessed   PALPATION: Mild pitting edema ulnar border of Left UE  OBSERVATIONS / OTHER ASSESSMENTS: Bandaid on forearm with small amount of clear  drainage showing through. Other aspect of incision that can be viewed is healed. Sensation to light touch in the hand is intact. Dorsum of hand is slightly swollen, and upper arm swollen above where she had TG soft folded over and with bottom folded over several inches, may be making hand swell. She complains of left wrist pain worse at night and has a weak left grip  SENSATION: Light touch: Appears intact throughout left UE   POSTURE: forward head, rounded shoulders  UPPER EXTREMITY AROM/PROM:  A/PROM RIGHT   eval   Shoulder extension   Shoulder flexion   Shoulder abduction   Shoulder internal rotation   Shoulder external rotation     (Blank rows = not tested)  A/PROM LEFT   eval  Shoulder extension   Shoulder flexion   Shoulder abduction   Shoulder internal rotation   Shoulder external rotation     (Blank rows = not tested)  CERVICAL AROM: All within functional limits:   UPPER EXTREMITY STRENGTH: NT  LYMPHEDEMA ASSESSMENTS:   SURGERY TYPE/DATE: Left breast Lumpectomy 1997, Right lumpectomy 2012  NUMBER OF LYMPH NODES REMOVED: 15 on left UE  CHEMOTHERAPY:Yes, presently on medications for metastasis  RADIATION:bilateral   HORMONE TREATMENT: yes  INFECTIONS: yes  LYMPHEDEMA ASSESSMENTS:   LANDMARK RIGHT  eval  10 cm proximal to olecranon process 27.5  Olecranon process 22.4  10 cm proximal to ulnar styloid process 19.0  Just proximal to ulnar styloid process 15.2  Across hand at thumb web space 17.9  At base of 2nd digit 6.3  (Blank rows = not tested)  LANDMARK LEFT  eval LEFT 12/18/2021 LEFT 12/24/2021 LEFT 12/29/21  10 cm proximal to olecranon process 28.2 29.0 28.6 28.5  Olecranon process 27.5 25.3 25.6 25.5  10 cm proximal to ulnar styloid process 24.0 21  21.6 21.8  Just proximal to ulnar styloid process 18.2 17.7 18.2 17.5  Across hand at thumb web space 18.5 18.0 18.3 17.5  At base of 2nd digit 6.5 6.3 6.3 6.4  (Blank rows = not tested)      LLIS : 47%   TODAY'S TREATMENT:                                                                                                                           DATE 12/29/2021 Remeasured circumferences Pt measured with slight increases throughout Manual lymph drainage in supine as follows: short neck, (no diaphragmatic breathing due to shortness of breath from medication) , left inguinal nodes, left axillo-inguinal anastamoses. Left upper extremity working proximal to distal then retracing all steps being very  careful around area on forearm where pt recently had surgery. Compression bandaging: New TG soft from hand to axilla, mollelast to digits 1-4 covered with paper tape, artiflex from hand to axilla, 1 6cm bandage at hand, 1 8 cm bandage from wrist to axilla in herringbone, 1 12 cm bandage from hand to axilla with spiral. 12/24/2021 Pt measured with slight increases throughout Manual lymph drainage in supine as follows: short neck, (no diaphragmatic breathing due to shortness of breath from medication) , left inguinal nodes, left axillo-inguinal anastamoses. Left upper extremity working proximal to distal then retracing all steps being very careful around area on forearm where pt recently had surgery. Compression bandaging: New TG soft from hand to axilla, mollelast to digits 1-5 then , artiflex from hand to axilla, 1 6cm bandage at hand, 1 8 cm bandage from wrist to axilla in spiral, 1 12 cm bandage from hand to axilla with spiral. Checked capillary refill and was good  12/22/21 Manual lymph drainage in supine as follows: short neck, (no diaphragmatic breathing due to shortness of breath from medication) , left inguinal nodes, left axillo-inguinal anastamoses. Left upper extremity working proximal to  distal then retracing all steps being very careful around area on forearm where pt recently had surgery. Compression bandaging:  TG soft from hand to axilla, mollelast to digits 1-5 then covered with paper tape, artiflex from hand to axilla, 1 6cm bandage at hand, 1 8 cm bandage from wrist to axilla in herringbone, 1 12 cm bandage from hand to axilla with spiral.    12/19/2021  Pt measured Manual lymph drainage in supine as follows: short neck, (no diaphragmatic breathing due to shortness of breath from medication) , left inguinal nodes, left axillo-inguinal anastamoses. Left upper extremity working proximal to distal then retracing all steps being very careful around area on forearm where pt recently had surgery. Compression bandaging:  TG soft from hand to axilla, mollelast to digits 1-5, artiflex from hand to axilla, 1 6cm bandage at hand, 1 8 cm bandage from wrist to axilla in spiral, 1 12 cm bandage from hand to axilla with spiral.   Pt educated to try and wash wraps on Sunday evening and lay on rack to dry. Wear Tribute at night and in the am before coming to therapy later that day.  12/16/2021 Manual lymph drainage in supine as follows: short neck, (no diaphragmatic breathing due to shortness of breath from medication) , left inguinal nodes, left axillo-inguinal anastamoses. Left upper extremity working proximal to distal then retracing all steps being very careful around area on forearm where pt recently had surgery. Compression bandaging:  TG soft from hand to axilla, mollelast to digits 1-5, artiflex from hand to axilla, 1 6cm bandage at hand, 1 8 cm bandage from wrist to axilla in spiral, 1 12 cm bandage from hand to axilla with spiral.   Initially wrapped 8 cm wrap with herringbone with 10 cm spiral over it but was afraid it would be too tight sow rewrapped both in spiral.   12/11/2021 Measured pt at 10 cm above ulna styloid and pt had a 2 cm reduction Manual lymph drainage in supine as  follows: short neck, (no diaphragmatic breathing due to shortness of breath from medication) , left inguinal nodes, left axillo-inguinal anastamoses. Left upper extremity working proximal to distal then retracing all steps being very careful around area on forearm where pt recently had surgery. Compression bandaging: TG soft from hand to axilla, mollelast to digits 1-5, artiflex from  hand to axilla, 1 6cm bandage at hand, 1 8 cm bandage from wrist to axilla in herringbone, 1 12 cm bandage from hand to axilla. Pt advised 4 days is a long time to wear wraps. If they slide down or become uncomfortable she must remove. She may try to get her appts Mon-Thurs. Instead of Tues Thurs.  12/09/21: Manual lymph drainage in supine as follows: short neck, (no diaphragmatic breathing due to shortness of breath from medication) , left inguinal nodes, left axillo-inguinal anastamoses. Left upper extremity working proximal to distal then retracing all steps being very careful around area on forearm where pt recently had surgery. Compression bandaging: TG soft from hand to axilla, mollelast to digits 1-5, artiflex from hand to axilla, 1 6cm bandage at hand, 1 8 cm bandage from wrist to axilla in herringbone, 1 12 cm bandage from hand to axilla  Eval: Discussed POC and wrapping with pt. Pt is agreeable to try wrapping for 2 weeks to see if her arm would reduce. Swelling is soft and with compliance should reduce so she can be fit for a new sleeve. Discussed caution folding over TG soft too much because of increasing the pressure possibly increasing swelling at hand and at upper arm.Marland Kitchen PATIENT EDUCATION:  Education details: discussed POC, bandaging, new garments, need for 3x/week, instruct family member for weekends if she would like. Person educated: Patient Education method: Explanation Education comprehension: verbalized understanding  HOME EXERCISE PROGRAM: None given today  ASSESSMENT:  CLINICAL  IMPRESSION: Circumferences decreased this visit so decided to wait just a while longer before measuring for garments. Can reassess again in another visit or 2. 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 She will benefit from being measured for a sleeve once she reaches maximal reduction.   OBJECTIVE IMPAIRMENTS: decreased activity tolerance, decreased knowledge of condition, decreased ROM, increased edema, impaired UE functional use, postural dysfunction, and pain.   ACTIVITY LIMITATIONS: lifting, dressing, and reach over head  PARTICIPATION LIMITATIONS: cleaning, yard work, and reaching activities  PERSONAL FACTORS: 3+ comorbidities: Breast Cancer s/p radiation and with metastasis, COPD, OA, current cancer meds  are also affecting patient's functional outcome.   REHAB POTENTIAL: Good  CLINICAL DECISION MAKING: Stable/uncomplicated  EVALUATION COMPLEXITY: Low  GOALS: Goals reviewed with patient? Yes  SHORT TERM GOALS: Target date: 01/19/2022    Pt will be tolerant of compression bandaging Baseline: Goal status: MET  12/18/2021 2.  Pt will have reduction at olecranon and 10 cm prox to ulna by 1.5 cm Baseline:  Goal status: MET 12/18/2021  LONG TERM GOALS: Target date: 02/09/2022    Pt will have decreased edema at olecranon and 10 cm prox to ulna styloid by 2.5 cm Baseline:  Goal status: INITIAL  2.  Pt will be fit for appropriate compression sleeve Baseline:  Goal status: INITIAL  3.  Pt will be independent with self care of lymphedema including day and night compression Baseline:  Goal status: INITIAL  4.  Pt will improve LLIS to no greater than 30% to demonstrate improved function  Baseline:  Goal status: INITIAL    PLAN:  PT FREQUENCY: 3x/week  PT DURATION: 6 weeks  PLANNED INTERVENTIONS: Therapeutic exercises, Patient/Family education, Self Care, Orthotic/Fit training, Manual  lymph drainage, Compression bandaging, and Manual therapy  PLAN FOR NEXT SESSION:  Measure next visit,check shoulder ROM? Make goal prn, how were bandages?, continue CDT. Bandage 2 weeks and  if not reducing discontinue, otherwise continue until plateau then fit for new garments.   Allyson Sabal [place] [employer], PT 12/29/2021, 2:57 PM

## 2022-01-01 ENCOUNTER — Ambulatory Visit: Payer: Medicare Other

## 2022-01-01 DIAGNOSIS — C44629 Squamous cell carcinoma of skin of left upper limb, including shoulder: Secondary | ICD-10-CM

## 2022-01-01 DIAGNOSIS — C44619 Basal cell carcinoma of skin of left upper limb, including shoulder: Secondary | ICD-10-CM

## 2022-01-01 DIAGNOSIS — I890 Lymphedema, not elsewhere classified: Secondary | ICD-10-CM

## 2022-01-01 NOTE — Therapy (Addendum)
OUTPATIENT PHYSICAL THERAPY ONCOLOGY TREATMENT  Patient Name: Stacey [name] MRN: [id number] DOB:1940-05-13, 81 y.o., female Today's Date: 01/01/2022   PT End of Session - 01/01/22 1356     Visit Number 9    Number of Visits 18    Date for PT Re-Evaluation 01/05/22    PT Start Time 6599    PT Stop Time 1504    PT Time Calculation (min) 67 min    Activity Tolerance Patient tolerated treatment well    Behavior During Therapy [facility] for tasks assessed/performed             Past Medical History:  Diagnosis Date   ANEMIA-NOS    ANXIETY    Breast cancer ([place]) 1997 L, 2012 R   s/p chemo/xrt   COPD    resolved   DIVERTICULOSIS, COLON 2008   Dizziness    Family history of breast cancer    Family history of lung cancer    Family history of lymphoma    Family history of pancreatic cancer    Family history of uterine cancer    GERD    Hx of radiation therapy 10/19/11 -12/03/11   right breast   HYPERLIPIDEMIA    IRRITABLE BOWEL SYNDROME, HX OF    Left-sided carotid artery disease (HCC)    moderate left ICA stenosis   OSTEOARTHRITIS, HAND    PSVT (paroxysmal supraventricular tachycardia)    symptomatic on event monitor   Past Surgical History:  Procedure Laterality Date   ABDOMINAL HYSTERECTOMY     APPENDECTOMY     BREAST BIOPSY  11/14/10    r breast: inv, insitu mammary carcinoma w/calcif, er/pr +, her2 -   BREAST SURGERY     lumpectomy   CATARACT EXTRACTION     both eyes   ELECTROPHYSIOLOGIC STUDY N/A 05/10/2015   Procedure: SVT Ablation;  Surgeon: Will Meredith Leeds, MD;  Location: [facility];  Service: Cardiovascular;  Laterality: N/A;   HERNIA REPAIR     inguinal herniorrhapy  1984   left   IR US GUIDE BX ASP/DRAIN  05/26/2019   rectal fissure repair     s/p benign breast biopsy  2003   right   s/p left foot surgury  2009   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   spiral fx left foot  2008   no surgury   TMJ ARTHROPLASTY  1989   TONGUE SURGERY      1988- to remove scar tissue growth    TONSILLECTOMY     Patient Active Problem List   Diagnosis Date Noted   Acute upper respiratory infection 05/16/2021   Nasal dryness 02/04/2021   Chronic sinusitis 11/25/2020   Allergic rhinitis 07/08/2020   Hypomagnesemia 06/10/2020   Goals of care, counseling/discussion 06/21/2019   Family history of breast cancer    Family history of pancreatic cancer    Family history of uterine cancer    Family history of lymphoma    Family history of lung cancer    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[place]) 06/02/2019   Recurrent cancer of left breast ([place]) 05/15/2019   Pes anserine bursitis 03/31/2019   HTN (hypertension) 02/10/2019   Hyperglycemia 06/21/2018   Left carpal tunnel syndrome 02/22/2018   Rotator cuff arthropathy of left shoulder 09/20/2017   Arthritis of hand 08/23/2017   Pain in right hand 08/23/2017   Cervical radiculitis 04/09/2017   Left shoulder pain 04/09/2017   Dyspnea on exertion 05/14/2016  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   Lymphedema of left arm 01/14/2016   Left arm swelling 12/25/2015   SVT (supraventricular tachycardia)    Left-sided carotid artery disease ([place]) 03/08/2015   Dizziness 01/24/2015   Urinary frequency 01/13/2015   Dizziness and giddiness 01/09/2015   Gallstones 02/21/2013   Malignant neoplasm of upper-inner quadrant of left breast in female, estrogen receptor positive ([place]) 11/28/2012   Muscle cramping 09/12/2012   Right hip pain 09/12/2012   Lower back pain 09/12/2012   COPD GOLD I     Hx of radiation therapy    Right shoulder pain 09/14/2011   Preventative health care 07/29/2010   Skin lesion of left leg 07/29/2010   Paresthesia 07/29/2010   GERD 03/28/2010   CONSTIPATION 03/28/2010   Osteoarthrosis, 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   Anxiety state 05/03/2009   Hyperlipidemia 06/14/2007   FATIGUE 06/14/2007   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  Rationale for Evaluation and Treatment Rehabilitation  SUBJECTIVE:                                                                                                                                                                                          SUBJECTIVE STATEMENT: I wore my wraps today so you could take them off and measure me. I brought my night garment and old sleeve  PERTINENT HISTORY:  Hx breast cancer with 15 lymph node removal on the L and Bil lumpectomy. Lumpectomy on the L in 1997and the R in 2012. Radiation Bil.  New discovery of metastatic breast Cancer presently on Palbocidib and Anastrozole,  Fulvestrant since 06/2019.Recent PET scan on 10/09/2021 showed no evidence of recurrent or metastatic disease. Prior lymphedema treatment for Left UE in 2021  PAIN:  Are you having pain? No pain today NPRS scale: pt did not rate on scale Pain location: wrist and hand on left  Pain orientation: Left  PAIN TYPE: stabbing Pain description: intermittent  Aggravating factors: evening Relieving factors: Tribute wrap but doesn't wear the hand piece.  PRECAUTIONS: Other: Left UE lymphedema , Metastatic breast Cancer, COPD  WEIGHT BEARING RESTRICTIONS: No  FALLS:  Has patient fallen in last 6 months? No  LIVING ENVIRONMENT: Lives with: lives alone Lives in: House/apartment Stairs:  No; External: 1 steps; on right going up Has following equipment at home: None  OCCUPATION: retired  LEISURE: reading, puzzles, walks very little  HAND DOMINANCE: right   PRIOR LEVEL OF FUNCTION: Independent  PATIENT GOALS: Try and  getting swelling in left arm down. Get a new compression sleeve that fits properly   OBJECTIVE:  COGNITION: Overall cognitive status: Within functional limits for tasks assessed   PALPATION: Mild pitting edema ulnar border of Left UE  OBSERVATIONS / OTHER ASSESSMENTS: Bandaid on forearm with small amount of clear  drainage showing through. Other aspect of incision that can be viewed is healed. Sensation to light touch in the hand is intact. Dorsum of hand is slightly swollen, and upper arm swollen above where she had TG soft folded over and with bottom folded over several inches, may be making hand swell. She complains of left wrist pain worse at night and has a weak left grip  SENSATION: Light touch: Appears intact throughout left UE   POSTURE: forward head, rounded shoulders  UPPER EXTREMITY AROM/PROM:  A/PROM RIGHT   eval   Shoulder extension   Shoulder flexion   Shoulder abduction   Shoulder internal rotation   Shoulder external rotation     (Blank rows = not tested)  A/PROM LEFT   eval  Shoulder extension   Shoulder flexion   Shoulder abduction   Shoulder internal rotation   Shoulder external rotation     (Blank rows = not tested)  CERVICAL AROM: All within functional limits:   UPPER EXTREMITY STRENGTH: NT  LYMPHEDEMA ASSESSMENTS:   SURGERY TYPE/DATE: Left breast Lumpectomy 1997, Right lumpectomy 2012  NUMBER OF LYMPH NODES REMOVED: 15 on left UE  CHEMOTHERAPY:Yes, presently on medications for metastasis  RADIATION:bilateral   HORMONE TREATMENT: yes  INFECTIONS: yes  LYMPHEDEMA ASSESSMENTS:   LANDMARK RIGHT  eval  10 cm proximal to olecranon process 27.5  Olecranon process 22.4  10 cm proximal to ulnar styloid process 19.0  Just proximal to ulnar styloid process 15.2  Across hand at thumb web space 17.9  At base of 2nd digit 6.3  (Blank rows = not tested)  LANDMARK LEFT  eval LEFT 12/18/2021 LEFT 12/24/2021 LEFT 12/29/21 LEFT 01/01/2022   10 cm proximal to olecranon process 28.2 29.0 28.6 28.5 28  Olecranon process 27.5 25.3 25.6 25.5 25.6  10 cm proximal to ulnar styloid process 24.0 21 21.6 21.8 21.3  Just proximal to ulnar styloid process 18.2 17.7 18.2 17.5 17.7  Across hand at thumb web space 18.5 18.0 18.3 17.5 17.7  At base of 2nd digit 6.5 6.3 6.3 6.4 6.3  (Blank rows = not tested)  Wrist   16.4     Elbow  26           axilla  29.2,     MCPS17.3    wrist 16.8  (SLEEVE MEASUREMENTS)    LLIS : 47%   TODAY'S TREATMENT:  DATE 01/01/2022 Pt brought in old Juzo sleeve that is too big at the top. Class 1 circular knit Size IV. She does not have a handpiece. Also brought in her tribute night without the hand piece. Pt has tribute very loose so not really helping much, but she is not able to tighten the Velcro straps with it on. Sleeve is too short. Adjusted velcro straps to make slightly tighter. We may have an oversleeve in the back that would help her get it slightly snugger. Remeasured circumferences for PT note and compression sleeve/gauntlet. Manual lymph drainage in supine as follows: short neck, (no diaphragmatic breathing due to shortness of breath from medication) , left inguinal nodes, left axillo-inguinal anastamoses. Left upper extremity working proximal to distal then retracing all steps being very careful around area on forearm where pt recently had surgery. Compression bandaging: TG soft from hand to axilla, mollelast to digits 1-4 covered with paper tape, artiflex from hand to axilla, 1 6cm bandage at hand, 1 8 cm bandage from wrist to axilla in herringbone, 1 12 cm bandage from hand to axilla with spiral. Pt does not fit into a Juzo off the shelf . Searched garments after pt left. She does fit into Size 1 Medi Mondi Esprit sleeve/glove standard length, or a Medi Harmony size 2 glove, Exostrong  sleeve small short May want to wait until January to consider a Circaid night  12/29/2021 Remeasured circumferences Pt measured with slight increases throughout Manual lymph drainage in supine as follows: short neck, (no diaphragmatic breathing due to shortness of breath from medication) , left inguinal nodes, left axillo-inguinal anastamoses. Left upper extremity working proximal to distal then retracing all steps being very careful around area on forearm where pt recently had surgery. Compression bandaging: New TG soft from hand to axilla, mollelast to digits 1-4 covered with paper tape, artiflex from hand to axilla, 1 6cm bandage at hand, 1 8 cm bandage from wrist to axilla in herringbone, 1 12 cm bandage from hand to axilla with spiral. 12/24/2021 Pt measured with slight increases throughout Manual lymph drainage in supine as follows: short neck, (no diaphragmatic breathing due to shortness of breath from medication) , left inguinal nodes, left axillo-inguinal anastamoses. Left upper extremity working proximal to distal then retracing all steps being very careful around area on forearm where pt recently had surgery. Compression bandaging: New TG soft from hand to axilla, mollelast to digits 1-5 then , artiflex from hand to axilla, 1 6cm bandage at hand, 1 8 cm bandage from wrist to axilla in spiral, 1 12 cm bandage from hand to axilla with spiral. Checked capillary refill and was good  12/22/21 Manual lymph drainage in supine as follows: short neck, (no diaphragmatic breathing due to shortness of breath from medication) , left inguinal nodes, left axillo-inguinal anastamoses. Left upper extremity working proximal to distal then retracing all steps being very careful around area on forearm where pt recently had surgery. Compression bandaging:  TG soft from hand to axilla, mollelast to digits 1-5 then covered with paper tape, artiflex from hand to axilla, 1 6cm bandage at hand, 1 8 cm bandage from  wrist to axilla in herringbone, 1 12 cm bandage from hand to axilla with spiral.    12/19/2021  Pt measured Manual lymph drainage in supine as follows: short neck, (no diaphragmatic breathing due to shortness of breath from medication) , left inguinal nodes, left axillo-inguinal anastamoses. Left upper extremity working proximal to distal then retracing all steps being very  careful around area on forearm where pt recently had surgery. Compression bandaging:  TG soft from hand to axilla, mollelast to digits 1-5, artiflex from hand to axilla, 1 6cm bandage at hand, 1 8 cm bandage from wrist to axilla in spiral, 1 12 cm bandage from hand to axilla with spiral.   Pt educated to try and wash wraps on Sunday evening and lay on rack to dry. Wear Tribute at night and in the am before coming to therapy later that day.  12/16/2021 Manual lymph drainage in supine as follows: short neck, (no diaphragmatic breathing due to shortness of breath from medication) , left inguinal nodes, left axillo-inguinal anastamoses. Left upper extremity working proximal to distal then retracing all steps being very careful around area on forearm where pt recently had surgery. Compression bandaging:  TG soft from hand to axilla, mollelast to digits 1-5, artiflex from hand to axilla, 1 6cm bandage at hand, 1 8 cm bandage from wrist to axilla in spiral, 1 12 cm bandage from hand to axilla with spiral.   Initially wrapped 8 cm wrap with herringbone with 10 cm spiral over it but was afraid it would be too tight sow rewrapped both in spiral.   12/11/2021 Measured pt at 10 cm above ulna styloid and pt had a 2 cm reduction Manual lymph drainage in supine as follows: short neck, (no diaphragmatic breathing due to shortness of breath from medication) , left inguinal nodes, left axillo-inguinal anastamoses. Left upper extremity working proximal to distal then retracing all steps being very careful around area on forearm where pt recently had  surgery. Compression bandaging: TG soft from hand to axilla, mollelast to digits 1-5, artiflex from hand to axilla, 1 6cm bandage at hand, 1 8 cm bandage from wrist to axilla in herringbone, 1 12 cm bandage from hand to axilla. Pt advised 4 days is a long time to wear wraps. If they slide down or become uncomfortable she must remove. She may try to get her appts Mon-Thurs. Instead of Tues Thurs.  12/09/21: Manual lymph drainage in supine as follows: short neck, (no diaphragmatic breathing due to shortness of breath from medication) , left inguinal nodes, left axillo-inguinal anastamoses. Left upper extremity working proximal to distal then retracing all steps being very careful around area on forearm where pt recently had surgery. Compression bandaging: TG soft from hand to axilla, mollelast to digits 1-5, artiflex from hand to axilla, 1 6cm bandage at hand, 1 8 cm bandage from wrist to axilla in herringbone, 1 12 cm bandage from hand to axilla  Eval: Discussed POC and wrapping with pt. Pt is agreeable to try wrapping for 2 weeks to see if her arm would reduce. Swelling is soft and with compliance should reduce so she can be fit for a new sleeve. Discussed caution folding over TG soft too much because of increasing the pressure possibly increasing swelling at hand and at upper arm.Marland Kitchen PATIENT EDUCATION:  Education details: discussed POC, bandaging, new garments, need for 3x/week, instruct family member for weekends if she would like. Person educated: Patient Education method: Explanation Education comprehension: verbalized understanding  HOME EXERCISE PROGRAM: None given today  ASSESSMENT:  CLINICAL IMPRESSION: Pt reports she wants to be done soon.  Arm is remaining fairly stable and ready to be fit for sleeve. Checking other options for pt to see if any other garments witll fit her since Juzo does not fit well.  She is at the low end of a Max and I  am fearful it will be too big at the top. See  sleeve measurements under arm measurements  OBJECTIVE IMPAIRMENTS: decreased activity tolerance, decreased knowledge of condition, decreased ROM, increased edema, impaired UE functional use, postural dysfunction, and pain.   ACTIVITY LIMITATIONS: lifting, dressing, and reach over head  PARTICIPATION LIMITATIONS: cleaning, yard work, and reaching activities  PERSONAL FACTORS: 3+ comorbidities: Breast Cancer s/p radiation and with metastasis, COPD, OA, current cancer meds  are also affecting patient's functional outcome.   REHAB POTENTIAL: Good  CLINICAL DECISION MAKING: Stable/uncomplicated  EVALUATION COMPLEXITY: Low  GOALS: Goals reviewed with patient? Yes  SHORT TERM GOALS: Target date: 01/22/2022    Pt will be tolerant of compression bandaging Baseline: Goal status: MET  12/18/2021 2.  Pt will have reduction at olecranon and 10 cm prox to ulna by 1.5 cm Baseline:  Goal status: MET 12/18/2021  LONG TERM GOALS: Target date: 02/12/2022    Pt will have decreased edema at olecranon and 10 cm prox to ulna styloid by 2.5 cm Baseline:  Goal status: INITIAL  2.  Pt will be fit for appropriate compression sleeve Baseline:  Goal status: INITIAL  3.  Pt will be independent with self care of lymphedema including day and night compression Baseline:  Goal status: INITIAL  4.  Pt will improve LLIS to no greater than 30% to demonstrate improved function  Baseline:  Goal status: INITIAL    PLAN:  PT FREQUENCY: 3x/week  PT DURATION: 6 weeks  PLANNED INTERVENTIONS: Therapeutic exercises, Patient/Family education, Self Care, Orthotic/Fit training, Manual lymph drainage, Compression bandaging, and Manual therapy  PLAN FOR NEXT SESSION:  Measure next visit,check shoulder ROM? Make goal prn, how were bandages?, continue CDT. Bandage 2 weeks and if not reducing discontinue, otherwise continue until plateau then fit for new garments.   Claris Pong, PT 01/01/2022, 3:05 PM

## 2022-01-02 NOTE — Telephone Encounter (Signed)
Oral Oncology Patient Advocate Encounter   Submitted application for assistance for Ibrance to [employer]   Application submitted via online portal   [employer] phone number [phone].   I will continue to check the status until final determination.   Stacey [name], CPhT-Adv Oncology Pharmacy Patient Advocate [facility] Direct Number: [phone]  Fax: [phone]   

## 2022-01-05 ENCOUNTER — Ambulatory Visit: Payer: Medicare Other | Attending: Dermatology | Admitting: Physical Therapy

## 2022-01-05 ENCOUNTER — Encounter: Payer: Self-pay | Admitting: Physical Therapy

## 2022-01-05 DIAGNOSIS — C44619 Basal cell carcinoma of skin of left upper limb, including shoulder: Secondary | ICD-10-CM | POA: Diagnosis not present

## 2022-01-05 DIAGNOSIS — I890 Lymphedema, not elsewhere classified: Secondary | ICD-10-CM | POA: Diagnosis not present

## 2022-01-05 DIAGNOSIS — C44629 Squamous cell carcinoma of skin of left upper limb, including shoulder: Secondary | ICD-10-CM | POA: Diagnosis not present

## 2022-01-05 NOTE — Therapy (Signed)
OUTPATIENT PHYSICAL THERAPY ONCOLOGY TREATMENT  Patient Name: Stacey [name] MRN: [id number] DOB:November 04, 1940, 81 y.o., female Today's Date: 01/05/2022   PT End of Session - 01/05/22 1411     Visit Number 10    Number of Visits 18    Date for PT Re-Evaluation 01/05/22    PT Start Time 9528    PT Stop Time 1500    PT Time Calculation (min) 57 min    Activity Tolerance Patient tolerated treatment well    Behavior During Therapy [facility] for tasks assessed/performed             Past Medical History:  Diagnosis Date   ANEMIA-NOS    ANXIETY    Breast cancer ([place]) 1997 L, 2012 R   s/p chemo/xrt   COPD    resolved   DIVERTICULOSIS, COLON 2008   Dizziness    Family history of breast cancer    Family history of lung cancer    Family history of lymphoma    Family history of pancreatic cancer    Family history of uterine cancer    GERD    Hx of radiation therapy 10/19/11 -12/03/11   right breast   HYPERLIPIDEMIA    IRRITABLE BOWEL SYNDROME, HX OF    Left-sided carotid artery disease (HCC)    moderate left ICA stenosis   OSTEOARTHRITIS, HAND    PSVT (paroxysmal supraventricular tachycardia)    symptomatic on event monitor   Past Surgical History:  Procedure Laterality Date   ABDOMINAL HYSTERECTOMY     APPENDECTOMY     BREAST BIOPSY  11/14/10    r breast: inv, insitu mammary carcinoma w/calcif, er/pr +, her2 -   BREAST SURGERY     lumpectomy   CATARACT EXTRACTION     both eyes   ELECTROPHYSIOLOGIC STUDY N/A 05/10/2015   Procedure: SVT Ablation;  Surgeon: Will Meredith Leeds, MD;  Location: [place] [facility];  Service: Cardiovascular;  Laterality: N/A;   HERNIA REPAIR     inguinal herniorrhapy  1984   left   IR US GUIDE BX ASP/DRAIN  05/26/2019   rectal fissure repair     s/p benign breast biopsy  2003   right   s/p left foot surgury  2009   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   spiral fx left foot  2008   no surgury   TMJ ARTHROPLASTY  1989   TONGUE SURGERY      1988- to remove scar tissue growth    TONSILLECTOMY     Patient Active Problem List   Diagnosis Date Noted   Acute upper respiratory infection 05/16/2021   Nasal dryness 02/04/2021   Chronic sinusitis 11/25/2020   Allergic rhinitis 07/08/2020   Hypomagnesemia 06/10/2020   Goals of care, counseling/discussion 06/21/2019   Family history of breast cancer    Family history of pancreatic cancer    Family history of uterine cancer    Family history of lymphoma    Family history of lung cancer    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[place]) 06/02/2019   Recurrent cancer of left breast ([place]) 05/15/2019   Pes anserine bursitis 03/31/2019   HTN (hypertension) 02/10/2019   Hyperglycemia 06/21/2018   Left carpal tunnel syndrome 02/22/2018   Rotator cuff arthropathy of left shoulder 09/20/2017   Arthritis of hand 08/23/2017   Pain in right hand 08/23/2017   Cervical radiculitis 04/09/2017   Left shoulder pain 04/09/2017   Dyspnea on exertion 05/14/2016  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   Lymphedema of left arm 01/14/2016   Left arm swelling 12/25/2015   SVT (supraventricular tachycardia)    Left-sided carotid artery disease ([place]) 03/08/2015   Dizziness 01/24/2015   Urinary frequency 01/13/2015   Dizziness and giddiness 01/09/2015   Gallstones 02/21/2013   Malignant neoplasm of upper-inner quadrant of left breast in female, estrogen receptor positive ([place]) 11/28/2012   Muscle cramping 09/12/2012   Right hip pain 09/12/2012   Lower back pain 09/12/2012   COPD GOLD I     Hx of radiation therapy    Right shoulder pain 09/14/2011   Preventative health care 07/29/2010   Skin lesion of left leg 07/29/2010   Paresthesia 07/29/2010   GERD 03/28/2010   CONSTIPATION 03/28/2010   Osteoarthrosis, 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   Anxiety state 05/03/2009   Hyperlipidemia 06/14/2007   FATIGUE 06/14/2007   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  Rationale for Evaluation and Treatment Rehabilitation  SUBJECTIVE:                                                                                                                                                                                          SUBJECTIVE STATEMENT: I wore my wraps today so you could take them off and measure me. I brought my night garment and old sleeve  PERTINENT HISTORY:  Hx breast cancer with 15 lymph node removal on the L and Bil lumpectomy. Lumpectomy on the L in 1997and the R in 2012. Radiation Bil.  New discovery of metastatic breast Cancer presently on Palbocidib and Anastrozole,  Fulvestrant since 06/2019.Recent PET scan on 10/09/2021 showed no evidence of recurrent or metastatic disease. Prior lymphedema treatment for Left UE in 2021  PAIN:  Are you having pain? No pain today NPRS scale: pt did not rate on scale Pain location: wrist and hand on left  Pain orientation: Left  PAIN TYPE: stabbing Pain description: intermittent  Aggravating factors: evening Relieving factors: Tribute wrap but doesn't wear the hand piece.  PRECAUTIONS: Other: Left UE lymphedema , Metastatic breast Cancer, COPD  WEIGHT BEARING RESTRICTIONS: No  FALLS:  Has patient fallen in last 6 months? No  LIVING ENVIRONMENT: Lives with: lives alone Lives in: House/apartment Stairs:  No; External: 1 steps; on right going up Has following equipment at home: None  OCCUPATION: retired  LEISURE: reading, puzzles, walks very little  HAND DOMINANCE: right   PRIOR LEVEL OF FUNCTION: Independent  PATIENT GOALS: Try and  getting swelling in left arm down. Get a new compression sleeve that fits properly   OBJECTIVE:  COGNITION: Overall cognitive status: Within functional limits for tasks assessed   PALPATION: Mild pitting edema ulnar border of Left UE  OBSERVATIONS / OTHER ASSESSMENTS: Bandaid on forearm with small amount of clear  drainage showing through. Other aspect of incision that can be viewed is healed. Sensation to light touch in the hand is intact. Dorsum of hand is slightly swollen, and upper arm swollen above where she had TG soft folded over and with bottom folded over several inches, may be making hand swell. She complains of left wrist pain worse at night and has a weak left grip  SENSATION: Light touch: Appears intact throughout left UE   POSTURE: forward head, rounded shoulders  UPPER EXTREMITY AROM/PROM:  A/PROM RIGHT   eval   Shoulder extension   Shoulder flexion   Shoulder abduction   Shoulder internal rotation   Shoulder external rotation     (Blank rows = not tested)  A/PROM LEFT   eval  Shoulder extension   Shoulder flexion   Shoulder abduction   Shoulder internal rotation   Shoulder external rotation     (Blank rows = not tested)  CERVICAL AROM: All within functional limits:   UPPER EXTREMITY STRENGTH: NT  LYMPHEDEMA ASSESSMENTS:   SURGERY TYPE/DATE: Left breast Lumpectomy 1997, Right lumpectomy 2012  NUMBER OF LYMPH NODES REMOVED: 15 on left UE  CHEMOTHERAPY:Yes, presently on medications for metastasis  RADIATION:bilateral   HORMONE TREATMENT: yes  INFECTIONS: yes  LYMPHEDEMA ASSESSMENTS:   LANDMARK RIGHT  eval  10 cm proximal to olecranon process 27.5  Olecranon process 22.4  10 cm proximal to ulnar styloid process 19.0  Just proximal to ulnar styloid process 15.2  Across hand at thumb web space 17.9  At base of 2nd digit 6.3  (Blank rows = not tested)  LANDMARK LEFT  eval LEFT 12/18/2021 LEFT 12/24/2021 LEFT 12/29/21 LEFT 01/01/2022   10 cm proximal to olecranon process 28.2 29.0 28.6 28.5 28  Olecranon process 27.5 25.3 25.6 25.5 25.6  10 cm proximal to ulnar styloid process 24.0 21 21.6 21.8 21.3  Just proximal to ulnar styloid process 18.2 17.7 18.2 17.5 17.7  Across hand at thumb web space 18.5 18.0 18.3 17.5 17.7  At base of 2nd digit 6.5 6.3 6.3 6.4 6.3  (Blank rows = not tested)  Wrist   16.4     Elbow  26           axilla  29.2,     MCPS17.3    wrist 16.8  (SLEEVE MEASUREMENTS)    LLIS : 47%   TODAY'S TREATMENT:  DATE 01/05/2022 Measured pt for CircAid Profile night time garment and she fit in to a size 2. Educated pt that it is important she has a well fitting nighttime garment to keep fluid from collecting in her arm overnight. Her current garment does not fit well and is too short. Assisted pt with ordering a Medi Mondi Espirit size 1 sleeve and glove for day time management.  Manual lymph drainage in supine as follows: short neck, (no diaphragmatic breathing due to shortness of breath from medication) , left inguinal nodes, left axillo-inguinal anastamoses. Left upper extremity working proximal to distal then retracing all steps being very careful around area on forearm where pt recently had surgery. Compression bandaging: TG soft from hand to axilla, mollelast to digits 1-4 covered with paper tape, artiflex from hand to axilla, 1 6cm bandage at hand, 1 8 cm bandage from wrist to axilla in herringbone, 1 12 cm bandage from hand to axilla with spiral.  01/01/2022 Pt brought in old Juzo sleeve that is too big at the top. Class 1 circular knit Size IV. She does not have a handpiece. Also brought in her tribute night without the hand piece. Pt has tribute very loose so not really helping much, but she is not able to tighten the Velcro straps with it on. Sleeve is too short. Adjusted velcro straps  to make slightly tighter. We may have an oversleeve in the back that would help her get it slightly snugger. Remeasured circumferences for PT note and compression sleeve/gauntlet. Manual lymph drainage in supine as follows: short neck, (no diaphragmatic breathing due to shortness of breath from medication) , left inguinal nodes, left axillo-inguinal anastamoses. Left upper extremity working proximal to distal then retracing all steps being very careful around area on forearm where pt recently had surgery. Compression bandaging: TG soft from hand to axilla, mollelast to digits 1-4 covered with paper tape, artiflex from hand to axilla, 1 6cm bandage at hand, 1 8 cm bandage from wrist to axilla in herringbone, 1 12 cm bandage from hand to axilla with spiral. Pt does not fit into a Juzo off the shelf . Searched garments after pt left. She does fit into Size 1 Medi Mondi Esprit sleeve/glove standard length, or a Medi Harmony size 2 glove, Exostrong sleeve small short May want to wait until January to consider a Circaid night  12/29/2021 Remeasured circumferences Pt measured with slight increases throughout Manual lymph drainage in supine as follows: short neck, (no diaphragmatic breathing due to shortness of breath from medication) , left inguinal nodes, left axillo-inguinal anastamoses. Left upper extremity working proximal to distal then retracing all steps being very careful around area on forearm where pt recently had surgery. Compression bandaging: New TG soft from hand to axilla, mollelast to digits 1-4 covered with paper tape, artiflex from hand to axilla, 1 6cm bandage at hand, 1 8 cm bandage from wrist to axilla in herringbone, 1 12 cm bandage from hand to axilla with spiral. 12/24/2021 Pt measured with slight increases throughout Manual lymph drainage in supine as follows: short neck, (no diaphragmatic breathing due to shortness of breath from medication) , left inguinal nodes, left axillo-inguinal  anastamoses. Left upper extremity working proximal to distal then retracing all steps being very careful around area on forearm where pt recently had surgery. Compression bandaging: New TG soft from hand to axilla, mollelast to digits 1-5 then , artiflex from hand to axilla, 1 6cm bandage at hand, 1 8 cm bandage from wrist to  axilla in spiral, 1 12 cm bandage from hand to axilla with spiral. Checked capillary refill and was good  12/22/21 Manual lymph drainage in supine as follows: short neck, (no diaphragmatic breathing due to shortness of breath from medication) , left inguinal nodes, left axillo-inguinal anastamoses. Left upper extremity working proximal to distal then retracing all steps being very careful around area on forearm where pt recently had surgery. Compression bandaging:  TG soft from hand to axilla, mollelast to digits 1-5 then covered with paper tape, artiflex from hand to axilla, 1 6cm bandage at hand, 1 8 cm bandage from wrist to axilla in herringbone, 1 12 cm bandage from hand to axilla with spiral.    12/19/2021  Pt measured Manual lymph drainage in supine as follows: short neck, (no diaphragmatic breathing due to shortness of breath from medication) , left inguinal nodes, left axillo-inguinal anastamoses. Left upper extremity working proximal to distal then retracing all steps being very careful around area on forearm where pt recently had surgery. Compression bandaging:  TG soft from hand to axilla, mollelast to digits 1-5, artiflex from hand to axilla, 1 6cm bandage at hand, 1 8 cm bandage from wrist to axilla in spiral, 1 12 cm bandage from hand to axilla with spiral.   Pt educated to try and wash wraps on Sunday evening and lay on rack to dry. Wear Tribute at night and in the am before coming to therapy later that day.  12/16/2021 Manual lymph drainage in supine as follows: short neck, (no diaphragmatic breathing due to shortness of breath from medication) , left inguinal  nodes, left axillo-inguinal anastamoses. Left upper extremity working proximal to distal then retracing all steps being very careful around area on forearm where pt recently had surgery. Compression bandaging:  TG soft from hand to axilla, mollelast to digits 1-5, artiflex from hand to axilla, 1 6cm bandage at hand, 1 8 cm bandage from wrist to axilla in spiral, 1 12 cm bandage from hand to axilla with spiral.   Initially wrapped 8 cm wrap with herringbone with 10 cm spiral over it but was afraid it would be too tight sow rewrapped both in spiral.   12/11/2021 Measured pt at 10 cm above ulna styloid and pt had a 2 cm reduction Manual lymph drainage in supine as follows: short neck, (no diaphragmatic breathing due to shortness of breath from medication) , left inguinal nodes, left axillo-inguinal anastamoses. Left upper extremity working proximal to distal then retracing all steps being very careful around area on forearm where pt recently had surgery. Compression bandaging: TG soft from hand to axilla, mollelast to digits 1-5, artiflex from hand to axilla, 1 6cm bandage at hand, 1 8 cm bandage from wrist to axilla in herringbone, 1 12 cm bandage from hand to axilla. Pt advised 4 days is a long time to wear wraps. If they slide down or become uncomfortable she must remove. She may try to get her appts Mon-Thurs. Instead of Tues Thurs.  12/09/21: Manual lymph drainage in supine as follows: short neck, (no diaphragmatic breathing due to shortness of breath from medication) , left inguinal nodes, left axillo-inguinal anastamoses. Left upper extremity working proximal to distal then retracing all steps being very careful around area on forearm where pt recently had surgery. Compression bandaging: TG soft from hand to axilla, mollelast to digits 1-5, artiflex from hand to axilla, 1 6cm bandage at hand, 1 8 cm bandage from wrist to axilla in herringbone, 1 12 cm bandage  from hand to axilla  Eval: Discussed POC  and wrapping with pt. Pt is agreeable to try wrapping for 2 weeks to see if her arm would reduce. Swelling is soft and with compliance should reduce so she can be fit for a new sleeve. Discussed caution folding over TG soft too much because of increasing the pressure possibly increasing swelling at hand and at upper arm.Marland Kitchen PATIENT EDUCATION:  Education details: discussed POC, bandaging, new garments, need for 3x/week, instruct family member for weekends if she would like. Person educated: Patient Education method: Explanation Education comprehension: verbalized understanding  HOME EXERCISE PROGRAM: None given today  ASSESSMENT:  CLINICAL IMPRESSION: Measured pt for a night time garment and assisted with ordering day time sleeve and glove that was measured at last session and a night time garment for long term management of edema. Continued with MLD and bandaging. Will assess pt's garments for proper fit when they arrive and educate pt in proper donning/doffing.   OBJECTIVE IMPAIRMENTS: decreased activity tolerance, decreased knowledge of condition, decreased ROM, increased edema, impaired UE functional use, postural dysfunction, and pain.   ACTIVITY LIMITATIONS: lifting, dressing, and reach over head  PARTICIPATION LIMITATIONS: cleaning, yard work, and reaching activities  PERSONAL FACTORS: 3+ comorbidities: Breast Cancer s/p radiation and with metastasis, COPD, OA, current cancer meds  are also affecting patient's functional outcome.   REHAB POTENTIAL: Good  CLINICAL DECISION MAKING: Stable/uncomplicated  EVALUATION COMPLEXITY: Low  GOALS: Goals reviewed with patient? Yes  SHORT TERM GOALS: Target date: 01/26/2022    Pt will be tolerant of compression bandaging Baseline: Goal status: MET  12/18/2021 2.  Pt will have reduction at olecranon and 10 cm prox to ulna by 1.5 cm Baseline:  Goal status: MET 12/18/2021  LONG TERM GOALS: Target date: 02/16/2022    Pt will have  decreased edema at olecranon and 10 cm prox to ulna styloid by 2.5 cm Baseline:  Goal status: INITIAL  2.  Pt will be fit for appropriate compression sleeve Baseline:  Goal status: INITIAL  3.  Pt will be independent with self care of lymphedema including day and night compression Baseline:  Goal status: INITIAL  4.  Pt will improve LLIS to no greater than 30% to demonstrate improved function  Baseline:  Goal status: INITIAL    PLAN:  PT FREQUENCY: 3x/week  PT DURATION: 6 weeks  PLANNED INTERVENTIONS: Therapeutic exercises, Patient/Family education, Self Care, Orthotic/Fit training, Manual lymph drainage, Compression bandaging, and Manual therapy  PLAN FOR NEXT SESSION:  Measure next visit,check shoulder ROM? Make goal prn, how were bandages?, continue CDT. Bandage 2 weeks and if not reducing discontinue, otherwise continue until plateau then fit for new garments.   Allyson Sabal Salineno, PT 01/05/2022, 3:42 PM

## 2022-01-07 ENCOUNTER — Other Ambulatory Visit: Payer: Self-pay | Admitting: Internal Medicine

## 2022-01-07 NOTE — Addendum Note (Signed)
Addended by: Wynelle Beckmann, Hilaria Ota L on: 01/07/2022 02:05 PM   Modules accepted: Orders

## 2022-01-08 ENCOUNTER — Ambulatory Visit: Payer: Medicare Other

## 2022-01-08 DIAGNOSIS — C44629 Squamous cell carcinoma of skin of left upper limb, including shoulder: Secondary | ICD-10-CM | POA: Diagnosis not present

## 2022-01-08 DIAGNOSIS — C44619 Basal cell carcinoma of skin of left upper limb, including shoulder: Secondary | ICD-10-CM

## 2022-01-08 DIAGNOSIS — I890 Lymphedema, not elsewhere classified: Secondary | ICD-10-CM

## 2022-01-08 NOTE — Therapy (Signed)
OUTPATIENT PHYSICAL THERAPY ONCOLOGY TREATMENT  Patient Name: Stacey [name] MRN: [id number] DOB:11-02-1940, 81 y.o., female Today's Date: 01/08/2022     PT End of Session - 01/08/22 1357     Visit Number 11    Number of Visits 22    Date for PT Re-Evaluation 02/02/22    PT Start Time 1400    PT Stop Time 4108    PT Time Calculation (min) 55 min    Activity Tolerance Patient tolerated treatment well    Behavior During Therapy [facility] for tasks assessed/performed               Past Medical History:  Diagnosis Date   ANEMIA-NOS    ANXIETY    Breast cancer ([place]) 1997 L, 2012 R   s/p chemo/xrt   COPD    resolved   DIVERTICULOSIS, COLON 2008   Dizziness    Family history of breast cancer    Family history of lung cancer    Family history of lymphoma    Family history of pancreatic cancer    Family history of uterine cancer    GERD    Hx of radiation therapy 10/19/11 -12/03/11   right breast   HYPERLIPIDEMIA    IRRITABLE BOWEL SYNDROME, HX OF    Left-sided carotid artery disease (HCC)    moderate left ICA stenosis   OSTEOARTHRITIS, HAND    PSVT (paroxysmal supraventricular tachycardia)    symptomatic on event monitor   Past Surgical History:  Procedure Laterality Date   ABDOMINAL HYSTERECTOMY     APPENDECTOMY     BREAST BIOPSY  11/14/10    r breast: inv, insitu mammary carcinoma w/calcif, er/pr +, her2 -   BREAST SURGERY     lumpectomy   CATARACT EXTRACTION     both eyes   ELECTROPHYSIOLOGIC STUDY N/A 05/10/2015   Procedure: SVT Ablation;  Surgeon: Will Meredith Leeds, MD;  Location: [place] [facility];  Service: Cardiovascular;  Laterality: N/A;   HERNIA REPAIR     inguinal herniorrhapy  1984   left   IR US GUIDE BX ASP/DRAIN  05/26/2019   rectal fissure repair     s/p benign breast biopsy  2003   right   s/p left foot surgury  2009   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   spiral fx left foot  2008   no surgury   TMJ ARTHROPLASTY   1989   TONGUE SURGERY     1988- to remove scar tissue growth    TONSILLECTOMY     Patient Active Problem List   Diagnosis Date Noted   Acute upper respiratory infection 05/16/2021   Nasal dryness 02/04/2021   Chronic sinusitis 11/25/2020   Allergic rhinitis 07/08/2020   Hypomagnesemia 06/10/2020   Goals of care, counseling/discussion 06/21/2019   Family history of breast cancer    Family history of pancreatic cancer    Family history of uterine cancer    Family history of lymphoma    Family history of lung cancer    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[place]) 06/02/2019   Recurrent cancer of left breast ([place]) 05/15/2019   Pes anserine bursitis 03/31/2019   HTN (hypertension) 02/10/2019   Hyperglycemia 06/21/2018   Left carpal tunnel syndrome 02/22/2018   Rotator cuff arthropathy of left shoulder 09/20/2017   Arthritis of hand 08/23/2017   Pain in right hand 08/23/2017   Cervical radiculitis 04/09/2017  Left shoulder pain 04/09/2017   Dyspnea on exertion 05/14/2016   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   Lymphedema of left arm 01/14/2016   Left arm swelling 12/25/2015   SVT (supraventricular tachycardia)    Left-sided carotid artery disease ([place]) 03/08/2015   Dizziness 01/24/2015   Urinary frequency 01/13/2015   Dizziness and giddiness 01/09/2015   Gallstones 02/21/2013   Malignant neoplasm of upper-inner quadrant of left breast in female, estrogen receptor positive ([place]) 11/28/2012   Muscle cramping 09/12/2012   Right hip pain 09/12/2012   Lower back pain 09/12/2012   COPD GOLD I     Hx of radiation therapy    Right shoulder pain 09/14/2011   Preventative health care 07/29/2010   Skin lesion of left leg 07/29/2010   Paresthesia 07/29/2010   GERD 03/28/2010   CONSTIPATION 03/28/2010   Osteoarthrosis, 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   Anxiety state 05/03/2009   Hyperlipidemia 06/14/2007   FATIGUE 06/14/2007   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  Rationale for Evaluation and Treatment Rehabilitation  SUBJECTIVE:                                                                                                                                                                                          SUBJECTIVE STATEMENT: I got all my garments yesterday. The day sleeve looks really small but I did not try it on.   PERTINENT HISTORY:  Hx breast cancer with 15 lymph node removal on the L and Bil lumpectomy. Lumpectomy on the L in 1997and the R in 2012. Radiation Bil.  New discovery of metastatic breast Cancer presently on Palbocidib and Anastrozole,  Fulvestrant since 06/2019.Recent PET scan on 10/09/2021 showed no evidence of recurrent or metastatic disease. Prior lymphedema treatment for Left UE in 2021  PAIN:  Are you having pain? No pain today NPRS scale: pt did not rate on scale Pain location: wrist and hand on left  Pain orientation: Left  PAIN TYPE: stabbing Pain description: intermittent  Aggravating factors: evening Relieving factors: Tribute wrap but doesn't wear the hand piece.  PRECAUTIONS: Other: Left UE lymphedema , Metastatic breast Cancer, COPD  WEIGHT BEARING RESTRICTIONS: No  FALLS:  Has patient fallen in last 6 months? No  LIVING ENVIRONMENT: Lives with: lives alone Lives in: House/apartment Stairs: No; External: 1 steps; on right going up Has following equipment at home: None  OCCUPATION: retired  LEISURE: reading, puzzles, walks very little  HAND DOMINANCE: right   PRIOR LEVEL OF FUNCTION: Independent  PATIENT GOALS:  Try and getting swelling in left arm down. Get a new compression sleeve that fits properly   OBJECTIVE:  COGNITION: Overall cognitive status: Within functional limits for tasks assessed   PALPATION: Mild pitting edema ulnar border of Left UE  OBSERVATIONS / OTHER ASSESSMENTS: Bandaid on forearm with small amount of clear  drainage showing through. Other aspect of incision that can be viewed is healed. Sensation to light touch in the hand is intact. Dorsum of hand is slightly swollen, and upper arm swollen above where she had TG soft folded over and with bottom folded over several inches, may be making hand swell. She complains of left wrist pain worse at night and has a weak left grip  SENSATION: Light touch: Appears intact throughout left UE   POSTURE: forward head, rounded shoulders  UPPER EXTREMITY AROM/PROM:  A/PROM RIGHT   eval   Shoulder extension   Shoulder flexion   Shoulder abduction   Shoulder internal rotation   Shoulder external rotation     (Blank rows = not tested)  A/PROM LEFT   eval  Shoulder extension   Shoulder flexion   Shoulder abduction   Shoulder internal rotation   Shoulder external rotation     (Blank rows = not tested)  CERVICAL AROM: All within functional limits:   UPPER EXTREMITY STRENGTH: NT  LYMPHEDEMA ASSESSMENTS:   SURGERY TYPE/DATE: Left breast Lumpectomy 1997, Right lumpectomy 2012  NUMBER OF LYMPH NODES REMOVED: 15 on left UE  CHEMOTHERAPY:Yes, presently on medications for metastasis  RADIATION:bilateral   HORMONE TREATMENT: yes  INFECTIONS: yes  LYMPHEDEMA ASSESSMENTS:   LANDMARK RIGHT  eval  10 cm proximal to olecranon process 27.5  Olecranon process 22.4  10 cm proximal to ulnar styloid process 19.0  Just proximal to ulnar styloid process 15.2  Across hand at thumb web space 17.9  At base of 2nd digit 6.3  (Blank rows = not tested)  LANDMARK LEFT  eval LEFT 12/18/2021 LEFT 12/24/2021 LEFT 12/29/21  LEFT 01/01/2022  10 cm proximal to olecranon process 28.2 29.0 28.6 28.5 28  Olecranon process 27.5 25.3 25.6 25.5 25.6  10 cm proximal to ulnar styloid process 24.0 21 21.6 21.8 21.3  Just proximal to ulnar styloid process 18.2 17.7 18.2 17.5 17.7  Across hand at thumb web space 18.5 18.0 18.3 17.5 17.7  At base of 2nd digit 6.5 6.3 6.3 6.4 6.3  (Blank rows = not tested)  Wrist   16.4     Elbow  26           axilla  29.2,     MCPS17.3    wrist 16.8  (SLEEVE MEASUREMENTS)    LLIS : 47%   TODAY'S TREATMENT:  DATE  01/08/2022 Pt brought all of her garments with her. Showed pt how to don Circaid Night garment using tabs on sleeve. She had some difficulty but when taking her time was able to get it on properly. She could not get power sleeve on and did not like it. Tried donning class 2 Medi Esprit day sleeve but pt was unable to get it over her wrist. Also tried EZ Slide and it was better, but pt was still not able to do independently and did not think she would be able to due to weakness in her right hand. Due to time running out told pt I will review other options for sleeve and will discuss with her next visit. She will return the [employer] sleeves. Compression bandaging: TG soft from hand to axilla, mollelast to digits 1-4 covered with paper tape, artiflex from hand to axilla, 1 6cm bandage at hand, 1 8 cm bandage from wrist to axilla in herringbone, 1 12 cm bandage from hand to axilla with spiral.  01/05/2022 Measured pt for CircAid Profile night time garment and she fit in to a size 2. Educated pt that it is important she has a well fitting nighttime garment to keep fluid from collecting in her arm overnight. Her current garment does not fit well and is too short. Assisted pt with ordering a Medi Mond[employer] Espirit size 1 sleeve and glove for day time management.  Manual  lymph drainage in supine as follows: short neck, (no diaphragmatic breathing due to shortness of breath from medication) , left inguinal nodes, left axillo-inguinal anastamoses. Left upper extremity working proximal to distal then retracing all steps being very careful around area on forearm where pt recently had surgery. Compression bandaging: TG soft from hand to axilla, mollelast to digits 1-4 covered with paper tape, artiflex from hand to axilla, 1 6cm bandage at hand, 1 8 cm bandage from wrist to axilla in herringbone, 1 12 cm bandage from hand to axilla with spiral.  01/01/2022 Pt brought in old Juzo sleeve that is too big at the top. Class 1 circular knit Size IV. She does not have a handpiece. Also brought in her tribute night without the hand piece. Pt has tribute very loose so not really helping much, but she is not able to tighten the Velcro straps with it on. Sleeve is too short. Adjusted velcro straps to make slightly tighter. We may have an oversleeve in the back that would help her get it slightly snugger. Remeasured circumferences for PT note and compression sleeve/gauntlet. Manual lymph drainage in supine as follows: short neck, (no diaphragmatic breathing due to shortness of breath from medication) , left inguinal nodes, left axillo-inguinal anastamoses. Left upper extremity working proximal to distal then retracing all steps being very careful around area on forearm where pt recently had surgery. Compression bandaging: TG soft from hand to axilla, mollelast to digits 1-4 covered with paper tape, artiflex from hand to axilla, 1 6cm bandage at hand, 1 8 cm bandage from wrist to axilla in herringbone, 1 12 cm bandage from hand to axilla with spiral. Pt does not fit into a Juzo off the shelf . Searched garments after pt left. She does fit into Size 1 Medi [facility] Esprit sleeve/glove standard length, or a Medi Harmony size 2 glove, Exostrong sleeve small short May want to wait until January to  consider a Circaid night  12/29/2021 Remeasured circumferences Pt measured with slight increases throughout Manual lymph drainage in supine as follows: short  neck, (no diaphragmatic breathing due to shortness of breath from medication) , left inguinal nodes, left axillo-inguinal anastamoses. Left upper extremity working proximal to distal then retracing all steps being very careful around area on forearm where pt recently had surgery. Compression bandaging: New TG soft from hand to axilla, mollelast to digits 1-4 covered with paper tape, artiflex from hand to axilla, 1 6cm bandage at hand, 1 8 cm bandage from wrist to axilla in herringbone, 1 12 cm bandage from hand to axilla with spiral. 12/24/2021 Pt measured with slight increases throughout Manual lymph drainage in supine as follows: short neck, (no diaphragmatic breathing due to shortness of breath from medication) , left inguinal nodes, left axillo-inguinal anastamoses. Left upper extremity working proximal to distal then retracing all steps being very careful around area on forearm where pt recently had surgery. Compression bandaging: New TG soft from hand to axilla, mollelast to digits 1-5 then , artiflex from hand to axilla, 1 6cm bandage at hand, 1 8 cm bandage from wrist to axilla in spiral, 1 12 cm bandage from hand to axilla with spiral. Checked capillary refill and was good  12/22/21 Manual lymph drainage in supine as follows: short neck, (no diaphragmatic breathing due to shortness of breath from medication) , left inguinal nodes, left axillo-inguinal anastamoses. Left upper extremity working proximal to distal then retracing all steps being very careful around area on forearm where pt recently had surgery. Compression bandaging:  TG soft from hand to axilla, mollelast to digits 1-5 then covered with paper tape, artiflex from hand to axilla, 1 6cm bandage at hand, 1 8 cm bandage from wrist to axilla in herringbone, 1 12 cm bandage from  hand to axilla with spiral.    12/19/2021  Pt measured Manual lymph drainage in supine as follows: short neck, (no diaphragmatic breathing due to shortness of breath from medication) , left inguinal nodes, left axillo-inguinal anastamoses. Left upper extremity working proximal to distal then retracing all steps being very careful around area on forearm where pt recently had surgery. Compression bandaging:  TG soft from hand to axilla, mollelast to digits 1-5, artiflex from hand to axilla, 1 6cm bandage at hand, 1 8 cm bandage from wrist to axilla in spiral, 1 12 cm bandage from hand to axilla with spiral.   Pt educated to try and wash wraps on Sunday evening and lay on rack to dry. Wear Tribute at night and in the am before coming to therapy later that day.  12/16/2021 Manual lymph drainage in supine as follows: short neck, (no diaphragmatic breathing due to shortness of breath from medication) , left inguinal nodes, left axillo-inguinal anastamoses. Left upper extremity working proximal to distal then retracing all steps being very careful around area on forearm where pt recently had surgery. Compression bandaging:  TG soft from hand to axilla, mollelast to digits 1-5, artiflex from hand to axilla, 1 6cm bandage at hand, 1 8 cm bandage from wrist to axilla in spiral, 1 12 cm bandage from hand to axilla with spiral.   Initially wrapped 8 cm wrap with herringbone with 10 cm spiral over it but was afraid it would be too tight sow rewrapped both in spiral.   12/11/2021 Measured pt at 10 cm above ulna styloid and pt had a 2 cm reduction Manual lymph drainage in supine as follows: short neck, (no diaphragmatic breathing due to shortness of breath from medication) , left inguinal nodes, left axillo-inguinal anastamoses. Left upper extremity working proximal to distal  then retracing all steps being very careful around area on forearm where pt recently had surgery. Compression bandaging: TG soft from hand to  axilla, mollelast to digits 1-5, artiflex from hand to axilla, 1 6cm bandage at hand, 1 8 cm bandage from wrist to axilla in herringbone, 1 12 cm bandage from hand to axilla. Pt advised 4 days is a long time to wear wraps. If they slide down or become uncomfortable she must remove. She may try to get her appts Mon-Thurs. Instead of Tues Thurs.  12/09/21: Manual lymph drainage in supine as follows: short neck, (no diaphragmatic breathing due to shortness of breath from medication) , left inguinal nodes, left axillo-inguinal anastamoses. Left upper extremity working proximal to distal then retracing all steps being very careful around area on forearm where pt recently had surgery. Compression bandaging: TG soft from hand to axilla, mollelast to digits 1-5, artiflex from hand to axilla, 1 6cm bandage at hand, 1 8 cm bandage from wrist to axilla in herringbone, 1 12 cm bandage from hand to axilla  Eval: Discussed POC and wrapping with pt. Pt is agreeable to try wrapping for 2 weeks to see if her arm would reduce. Swelling is soft and with compliance should reduce so she can be fit for a new sleeve. Discussed caution folding over TG soft too much because of increasing the pressure possibly increasing swelling at hand and at upper arm.Marland Kitchen PATIENT EDUCATION:  Education details: discussed POC, bandaging, new garments, need for 3x/week, instruct family member for weekends if she would like. Person educated: Patient Education method: Explanation Education comprehension: verbalized understanding  HOME EXERCISE PROGRAM: None given today  ASSESSMENT:  CLINICAL IMPRESSION: Pt brought in her new compression garments. She was able to don the circaid profile if she worked at it and rested a minute or 2 and it felt comfortable. She had great difficulty getting the oversleeve on the profile and felt she could not do that, however, she did have good impressions on her arm just from a few minutes in the profile itself.  The Medi Esprit sleeve and glove are going to be returned by pt because it was just too difficult for her to get on even with an EX slide. Will problem solve ideas with pt next visit for a different sleeve.   OBJECTIVE IMPAIRMENTS: decreased activity tolerance, decreased knowledge of condition, decreased ROM, increased edema, impaired UE functional use, postural dysfunction, and pain.   ACTIVITY LIMITATIONS: lifting, dressing, and reach over head  PARTICIPATION LIMITATIONS: cleaning, yard work, and reaching activities  PERSONAL FACTORS: 3+ comorbidities: Breast Cancer s/p radiation and with metastasis, COPD, OA, current cancer meds  are also affecting patient's functional outcome.   REHAB POTENTIAL: Good  CLINICAL DECISION MAKING: Stable/uncomplicated  EVALUATION COMPLEXITY: Low  GOALS: Goals reviewed with patient? Yes  SHORT TERM GOALS: Target date: 01/29/2022    Pt will be tolerant of compression bandaging Baseline: Goal status: MET  12/18/2021 2.  Pt will have reduction at olecranon and 10 cm prox to ulna by 1.5 cm Baseline: Goal status: MET 12/18/2021  LONG TERM GOALS: Target date: 02/19/2022    Pt will have decreased edema at olecranon and 10 cm prox to ulna styloid by 2.5 cm Baseline: 24.0 Goal status: MET 21.3 cm on 01/05/22  2.  Pt will be fit for appropriate compression sleeve Baseline:  Goal status: IN PROGRESS sleeve ordered 01/05/22 and night time garment and pt is awaiting arrival  3.  Pt will be independent with  self care of lymphedema including day and night compression Baseline:  Goal status: IN PROGRESS  4.  Pt will improve LLIS to no greater than 30% to demonstrate improved function  Baseline:  Goal status: IN PROGRESS    PLAN:  PT FREQUENCY: 3x/week  PT DURATION: 4 weeks  PLANNED INTERVENTIONS: Therapeutic exercises, Patient/Family education, Self Care, Orthotic/Fit training, Manual lymph drainage, Compression bandaging, and Manual  therapy  PLAN FOR NEXT SESSION:  Measure next visit,check shoulder ROM? Make goal prn, how were bandages?, continue CDT. Bandage 2 weeks and if not reducing discontinue, otherwise continue until plateau then fit for new garments.   Claris Pong, PT 01/08/2022, 5:42 PM

## 2022-01-12 ENCOUNTER — Ambulatory Visit: Payer: Medicare Other

## 2022-01-12 DIAGNOSIS — C44629 Squamous cell carcinoma of skin of left upper limb, including shoulder: Secondary | ICD-10-CM

## 2022-01-12 DIAGNOSIS — I890 Lymphedema, not elsewhere classified: Secondary | ICD-10-CM

## 2022-01-12 DIAGNOSIS — C44619 Basal cell carcinoma of skin of left upper limb, including shoulder: Secondary | ICD-10-CM

## 2022-01-12 NOTE — Therapy (Signed)
OUTPATIENT PHYSICAL THERAPY ONCOLOGY TREATMENT  Patient Name: Stacey [name] MRN: [id number] DOB:12/16/1940, 81 y.o., female Today's Date: 01/12/2022     PT End of Session - 01/12/22 1403     Visit Number 12    Number of Visits 22    Date for PT Re-Evaluation 02/02/22    PT Start Time 1404    PT Stop Time 7829    PT Time Calculation (min) 55 min    Activity Tolerance Patient tolerated treatment well    Behavior During Therapy [facility] for tasks assessed/performed               Past Medical History:  Diagnosis Date   ANEMIA-NOS    ANXIETY    Breast cancer ([place]) 1997 L, 2012 R   s/p chemo/xrt   COPD    resolved   DIVERTICULOSIS, COLON 2008   Dizziness    Family history of breast cancer    Family history of lung cancer    Family history of lymphoma    Family history of pancreatic cancer    Family history of uterine cancer    GERD    Hx of radiation therapy 10/19/11 -12/03/11   right breast   HYPERLIPIDEMIA    IRRITABLE BOWEL SYNDROME, HX OF    Left-sided carotid artery disease (HCC)    moderate left ICA stenosis   OSTEOARTHRITIS, HAND    PSVT (paroxysmal supraventricular tachycardia)    symptomatic on event monitor   Past Surgical History:  Procedure Laterality Date   ABDOMINAL HYSTERECTOMY     APPENDECTOMY     BREAST BIOPSY  11/14/10    r breast: inv, insitu mammary carcinoma w/calcif, er/pr +, her2 -   BREAST SURGERY     lumpectomy   CATARACT EXTRACTION     both eyes   ELECTROPHYSIOLOGIC STUDY N/A 05/10/2015   Procedure: SVT Ablation;  Surgeon: Will Meredith Leeds, MD;  Location: [facility];  Service: Cardiovascular;  Laterality: N/A;   HERNIA REPAIR     inguinal herniorrhapy  1984   left   IR US GUIDE BX ASP/DRAIN  05/26/2019   rectal fissure repair     s/p benign breast biopsy  2003   right   s/p left foot surgury  2009   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   spiral fx left foot  2008   no surgury   TMJ ARTHROPLASTY   1989   TONGUE SURGERY     1988- to remove scar tissue growth    TONSILLECTOMY     Patient Active Problem List   Diagnosis Date Noted   Acute upper respiratory infection 05/16/2021   Nasal dryness 02/04/2021   Chronic sinusitis 11/25/2020   Allergic rhinitis 07/08/2020   Hypomagnesemia 06/10/2020   Goals of care, counseling/discussion 06/21/2019   Family history of breast cancer    Family history of pancreatic cancer    Family history of uterine cancer    Family history of lymphoma    Family history of lung cancer    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[place]) 06/02/2019   Recurrent cancer of left breast ([place]) 05/15/2019   Pes anserine bursitis 03/31/2019   HTN (hypertension) 02/10/2019   Hyperglycemia 06/21/2018   Left carpal tunnel syndrome 02/22/2018   Rotator cuff arthropathy of left shoulder 09/20/2017   Arthritis of hand 08/23/2017   Pain in right hand 08/23/2017   Cervical radiculitis 04/09/2017  Left shoulder pain 04/09/2017   Dyspnea on exertion 05/14/2016   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   Lymphedema of left arm 01/14/2016   Left arm swelling 12/25/2015   SVT (supraventricular tachycardia)    Left-sided carotid artery disease ([place]) 03/08/2015   Dizziness 01/24/2015   Urinary frequency 01/13/2015   Dizziness and giddiness 01/09/2015   Gallstones 02/21/2013   Malignant neoplasm of upper-inner quadrant of left breast in female, estrogen receptor positive ([place]) 11/28/2012   Muscle cramping 09/12/2012   Right hip pain 09/12/2012   Lower back pain 09/12/2012   COPD GOLD I     Hx of radiation therapy    Right shoulder pain 09/14/2011   Preventative health care 07/29/2010   Skin lesion of left leg 07/29/2010   Paresthesia 07/29/2010   GERD 03/28/2010   CONSTIPATION 03/28/2010   Osteoarthrosis, 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   Anxiety state 05/03/2009   Hyperlipidemia 06/14/2007   FATIGUE 06/14/2007   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  Rationale for Evaluation and Treatment Rehabilitation  SUBJECTIVE:                                                                                                                                                                                          SUBJECTIVE STATEMENT: Do you have any thoughts about sleeves now? Took off wraps a few hours ago.   PERTINENT HISTORY:  Hx breast cancer with 15 lymph node removal on the L and Bil lumpectomy. Lumpectomy on the L in 1997and the R in 2012. Radiation Bil.  New discovery of metastatic breast Cancer presently on Palbocidib and Anastrozole,  Fulvestrant since 06/2019.Recent PET scan on 10/09/2021 showed no evidence of recurrent or metastatic disease. Prior lymphedema treatment for Left UE in 2021  PAIN:  Are you having pain? No pain today NPRS scale: pt did not rate on scale Pain location: wrist and hand on left  Pain orientation: Left  PAIN TYPE: stabbing Pain description: intermittent  Aggravating factors: evening Relieving factors: Tribute wrap but doesn't wear the hand piece.  PRECAUTIONS: Other: Left UE lymphedema , Metastatic breast Cancer, COPD  WEIGHT BEARING RESTRICTIONS: No  FALLS:  Has patient fallen in last 6 months? No  LIVING ENVIRONMENT: Lives with:  lives alone Lives in: House/apartment Stairs: No; External: 1 steps; on right going up Has following equipment at home: None  OCCUPATION: retired  LEISURE: reading, puzzles, walks very little  HAND DOMINANCE: right   PRIOR LEVEL OF FUNCTION: Independent  PATIENT GOALS: Try and getting  swelling in left arm down. Get a new compression sleeve that fits properly   OBJECTIVE:  COGNITION: Overall cognitive status: Within functional limits for tasks assessed   PALPATION: Mild pitting edema ulnar border of Left UE  OBSERVATIONS / OTHER ASSESSMENTS: Bandaid on forearm with small amount of clear  drainage showing through. Other aspect of incision that can be viewed is healed. Sensation to light touch in the hand is intact. Dorsum of hand is slightly swollen, and upper arm swollen above where she had TG soft folded over and with bottom folded over several inches, may be making hand swell. She complains of left wrist pain worse at night and has a weak left grip  SENSATION: Light touch: Appears intact throughout left UE   POSTURE: forward head, rounded shoulders  UPPER EXTREMITY AROM/PROM:  A/PROM RIGHT   eval   Shoulder extension   Shoulder flexion   Shoulder abduction   Shoulder internal rotation   Shoulder external rotation     (Blank rows = not tested)  A/PROM LEFT   eval  Shoulder extension   Shoulder flexion   Shoulder abduction   Shoulder internal rotation   Shoulder external rotation     (Blank rows = not tested)  CERVICAL AROM: All within functional limits:   UPPER EXTREMITY STRENGTH: NT  LYMPHEDEMA ASSESSMENTS:   SURGERY TYPE/DATE: Left breast Lumpectomy 1997, Right lumpectomy 2012  NUMBER OF LYMPH NODES REMOVED: 15 on left UE  CHEMOTHERAPY:Yes, presently on medications for metastasis  RADIATION:bilateral   HORMONE TREATMENT: yes  INFECTIONS: yes  LYMPHEDEMA ASSESSMENTS:   LANDMARK RIGHT  eval  10 cm proximal to olecranon process 27.5  Olecranon process 22.4  10 cm proximal to ulnar styloid process 19.0  Just proximal to ulnar styloid process 15.2  Across hand at thumb web space 17.9  At base of 2nd digit 6.3  (Blank rows = not tested)  LANDMARK LEFT  eval LEFT 12/18/2021 LEFT 12/24/2021 LEFT 12/29/21 LEFT 01/01/2022  10 cm  proximal to olecranon process 28.2 29.0 28.6 28.5 28  Olecranon process 27.5 25.3 25.6 25.5 25.6  10 cm proximal to ulnar styloid process 24.0 21 21.6 21.8 21.3  Just proximal to ulnar styloid process 18.2 17.7 18.2 17.5 17.7  Across hand at thumb web space 18.5 18.0 18.3 17.5 17.7  At base of 2nd digit 6.5 6.3 6.3 6.4 6.3  (Blank rows = not tested)  Wrist   16.4     Elbow  26           axilla  29.2,     MCPS17.3    wrist 16.8  (SLEEVE MEASUREMENTS)    LLIS : 47%   TODAY'S TREATMENT:  DATE   01/12/2022 Discussed compression sleeves and option for custom flat knit vs. Circular knit like Bella Strong. Pt has opted for custom flat knit and will be measured by Primitivo Gauze on Wed. Manual lymph drainage in supine as follows: short neck, (no diaphragmatic breathing due to shortness of breath from medication) , left inguinal nodes, left axillo-inguinal anastamoses. Left upper extremity working proximal to distal then retracing all steps being very careful around area on forearm where pt recently had surgery. Compression bandaging: TG soft from hand to axilla, mollelast to digits 1-4 covered with paper tape, artiflex from hand to axilla, 1 6cm bandage at hand, 1 8 cm bandage from wrist to axilla in herringbone, 1 12 cm bandage from hand to axilla with spiral.   01/08/2022 Pt brought all of her garments with her. Showed pt how to don Circaid Night garment using tabs on sleeve. She had some difficulty but when taking her time was able to get it on properly. She could not get power sleeve on and did not like it. Tried donning class 2 Medi Esprit day sleeve but pt was unable to get it over her wrist. Also tried EZ Slide and it was better, but pt was still not able to do independently and did not think she would be able to due to weakness in her right hand. Due to time running out told pt I  will review other options for sleeve and will discuss with her next visit. She will return the [employer] sleeves. Compression bandaging: TG soft from hand to axilla, mollelast to digits 1-4 covered with paper tape, artiflex from hand to axilla, 1 6cm bandage at hand, 1 8 cm bandage from wrist to axilla in herringbone, 1 12 cm bandage from hand to axilla with spiral.  01/05/2022 Measured pt for CircAid Profile night time garment and she fit in to a size 2. Educated pt that it is important she has a well fitting nighttime garment to keep fluid from collecting in her arm overnight. Her current garment does not fit well and is too short. Assisted pt with ordering a Medi Mondi Espirit size 1 sleeve and glove for day time management.  Manual lymph drainage in supine as follows: short neck, (no diaphragmatic breathing due to shortness of breath from medication) , left inguinal nodes, left axillo-inguinal anastamoses. Left upper extremity working proximal to distal then retracing all steps being very careful around area on forearm where pt recently had surgery. Compression bandaging: TG soft from hand to axilla, mollelast to digits 1-4 covered with paper tape, artiflex from hand to axilla, 1 6cm bandage at hand, 1 8 cm bandage from wrist to axilla in herringbone, 1 12 cm bandage from hand to axilla with spiral.  01/01/2022 Pt brought in old Juzo sleeve that is too big at the top. Class 1 circular knit Size IV. She does not have a handpiece. Also brought in her tribute night without the hand piece. Pt has tribute very loose so not really helping much, but she is not able to tighten the Velcro straps with it on. Sleeve is too short. Adjusted velcro straps to make slightly tighter. We may have an oversleeve in the back that would help her get it slightly snugger. Remeasured circumferences for PT note and compression sleeve/gauntlet. Manual lymph drainage in supine as follows: short neck, (no diaphragmatic breathing  due to shortness of breath from medication) , left inguinal nodes, left axillo-inguinal anastamoses. Left upper extremity working proximal to distal then  retracing all steps being very careful around area on forearm where pt recently had surgery. Compression bandaging: TG soft from hand to axilla, mollelast to digits 1-4 covered with paper tape, artiflex from hand to axilla, 1 6cm bandage at hand, 1 8 cm bandage from wrist to axilla in herringbone, 1 12 cm bandage from hand to axilla with spiral. Pt does not fit into a Juzo off the shelf . Searched garments after pt left. She does fit into Size 1 Medi Mondi Esprit sleeve/glove standard length, or a Medi Harmony size 2 glove, Exostrong sleeve small short May want to wait until January to consider a Circaid night  12/29/2021 Remeasured circumferences Pt measured with slight increases throughout Manual lymph drainage in supine as follows: short neck, (no diaphragmatic breathing due to shortness of breath from medication) , left inguinal nodes, left axillo-inguinal anastamoses. Left upper extremity working proximal to distal then retracing all steps being very careful around area on forearm where pt recently had surgery. Compression bandaging: New TG soft from hand to axilla, mollelast to digits 1-4 covered with paper tape, artiflex from hand to axilla, 1 6cm bandage at hand, 1 8 cm bandage from wrist to axilla in herringbone, 1 12 cm bandage from hand to axilla with spiral. 12/24/2021 Pt measured with slight increases throughout Manual lymph drainage in supine as follows: short neck, (no diaphragmatic breathing due to shortness of breath from medication) , left inguinal nodes, left axillo-inguinal anastamoses. Left upper extremity working proximal to distal then retracing all steps being very careful around area on forearm where pt recently had surgery. Compression bandaging: New TG soft from hand to axilla, mollelast to digits 1-5 then , artiflex from hand  to axilla, 1 6cm bandage at hand, 1 8 cm bandage from wrist to axilla in spiral, 1 12 cm bandage from hand to axilla with spiral. Checked capillary refill and was good  12/22/21 Manual lymph drainage in supine as follows: short neck, (no diaphragmatic breathing due to shortness of breath from medication) , left inguinal nodes, left axillo-inguinal anastamoses. Left upper extremity working proximal to distal then retracing all steps being very careful around area on forearm where pt recently had surgery. Compression bandaging:  TG soft from hand to axilla, mollelast to digits 1-5 then covered with paper tape, artiflex from hand to axilla, 1 6cm bandage at hand, 1 8 cm bandage from wrist to axilla in herringbone, 1 12 cm bandage from hand to axilla with spiral.    12/19/2021  Pt measured Manual lymph drainage in supine as follows: short neck, (no diaphragmatic breathing due to shortness of breath from medication) , left inguinal nodes, left axillo-inguinal anastamoses. Left upper extremity working proximal to distal then retracing all steps being very careful around area on forearm where pt recently had surgery. Compression bandaging:  TG soft from hand to axilla, mollelast to digits 1-5, artiflex from hand to axilla, 1 6cm bandage at hand, 1 8 cm bandage from wrist to axilla in spiral, 1 12 cm bandage from hand to axilla with spiral.   Pt educated to try and wash wraps on Sunday evening and lay on rack to dry. Wear Tribute at night and in the am before coming to therapy later that day.  12/16/2021 Manual lymph drainage in supine as follows: short neck, (no diaphragmatic breathing due to shortness of breath from medication) , left inguinal nodes, left axillo-inguinal anastamoses. Left upper extremity working proximal to distal then retracing all steps being very careful around area  on forearm where pt recently had surgery. Compression bandaging:  TG soft from hand to axilla, mollelast to digits 1-5,  artiflex from hand to axilla, 1 6cm bandage at hand, 1 8 cm bandage from wrist to axilla in spiral, 1 12 cm bandage from hand to axilla with spiral.   Initially wrapped 8 cm wrap with herringbone with 10 cm spiral over it but was afraid it would be too tight sow rewrapped both in spiral.   12/11/2021 Measured pt at 10 cm above ulna styloid and pt had a 2 cm reduction Manual lymph drainage in supine as follows: short neck, (no diaphragmatic breathing due to shortness of breath from medication) , left inguinal nodes, left axillo-inguinal anastamoses. Left upper extremity working proximal to distal then retracing all steps being very careful around area on forearm where pt recently had surgery. Compression bandaging: TG soft from hand to axilla, mollelast to digits 1-5, artiflex from hand to axilla, 1 6cm bandage at hand, 1 8 cm bandage from wrist to axilla in herringbone, 1 12 cm bandage from hand to axilla. Pt advised 4 days is a long time to wear wraps. If they slide down or become uncomfortable she must remove. She may try to get her appts Mon-Thurs. Instead of Tues Thurs.  12/09/21: Manual lymph drainage in supine as follows: short neck, (no diaphragmatic breathing due to shortness of breath from medication) , left inguinal nodes, left axillo-inguinal anastamoses. Left upper extremity working proximal to distal then retracing all steps being very careful around area on forearm where pt recently had surgery. Compression bandaging: TG soft from hand to axilla, mollelast to digits 1-5, artiflex from hand to axilla, 1 6cm bandage at hand, 1 8 cm bandage from wrist to axilla in herringbone, 1 12 cm bandage from hand to axilla  Eval: Discussed POC and wrapping with pt. Pt is agreeable to try wrapping for 2 weeks to see if her arm would reduce. Swelling is soft and with compliance should reduce so she can be fit for a new sleeve. Discussed caution folding over TG soft too much because of increasing the pressure  possibly increasing swelling at hand and at upper arm.Marland Kitchen PATIENT EDUCATION:  Education details: discussed POC, bandaging, new garments, need for 3x/week, instruct family member for weekends if she would like. Person educated: Patient Education method: Explanation Education comprehension: verbalized understanding  HOME EXERCISE PROGRAM: None given today  ASSESSMENT:  CLINICAL IMPRESSION:  Pt is opting to be measured for a custom sleeve on Wednesday by Primitivo Gauze since she is unable to get on the ready made [employer]. She is compliant with therapy but would like to be finished  at this point. WE will continue until pt receives the proper garments.  OBJECTIVE IMPAIRMENTS: decreased activity tolerance, decreased knowledge of condition, decreased ROM, increased edema, impaired UE functional use, postural dysfunction, and pain.   ACTIVITY LIMITATIONS: lifting, dressing, and reach over head  PARTICIPATION LIMITATIONS: cleaning, yard work, and reaching activities  PERSONAL FACTORS: 3+ comorbidities: Breast Cancer s/p radiation and with metastasis, COPD, OA, current cancer meds  are also affecting patient's functional outcome.   REHAB POTENTIAL: Good  CLINICAL DECISION MAKING: Stable/uncomplicated  EVALUATION COMPLEXITY: Low  GOALS: Goals reviewed with patient? Yes  SHORT TERM GOALS: Target date: 02/02/2022    Pt will be tolerant of compression bandaging Baseline: Goal status: MET  12/18/2021 2.  Pt will have reduction at olecranon and 10 cm prox to ulna by 1.5 cm Baseline: Goal status: MET 12/18/2021  LONG TERM GOALS: Target date: 02/23/2022    Pt will have decreased edema at olecranon and 10 cm prox to ulna styloid by 2.5 cm Baseline: 24.0 Goal status: MET 21.3 cm on 01/05/22  2.  Pt will be fit for appropriate compression sleeve Baseline:  Goal status: IN PROGRESS sleeve ordered 01/05/22 and night time garment and pt is awaiting arrival  3.  Pt will be independent with self  care of lymphedema including day and night compression Baseline:  Goal status: IN PROGRESS  4.  Pt will improve LLIS to no greater than 30% to demonstrate improved function  Baseline:  Goal status: IN PROGRESS    PLAN:  PT FREQUENCY: 3x/week  PT DURATION: 4 weeks  PLANNED INTERVENTIONS: Therapeutic exercises, Patient/Family education, Self Care, Orthotic/Fit training, Manual lymph drainage, Compression bandaging, and Manual therapy  PLAN FOR NEXT SESSION:  Measure next visit,check shoulder ROM? Make goal prn, how were bandages?, continue CDT. Bandage 2 weeks and if not reducing discontinue, otherwise continue until plateau then fit for new garments.   Claris Pong, PT 01/12/2022, 5:07 PM

## 2022-01-13 ENCOUNTER — Telehealth: Payer: Medicare Other

## 2022-01-15 ENCOUNTER — Ambulatory Visit: Payer: Medicare Other

## 2022-01-15 DIAGNOSIS — C44629 Squamous cell carcinoma of skin of left upper limb, including shoulder: Secondary | ICD-10-CM

## 2022-01-15 DIAGNOSIS — C44619 Basal cell carcinoma of skin of left upper limb, including shoulder: Secondary | ICD-10-CM | POA: Diagnosis not present

## 2022-01-15 DIAGNOSIS — I890 Lymphedema, not elsewhere classified: Secondary | ICD-10-CM | POA: Diagnosis not present

## 2022-01-15 NOTE — Therapy (Signed)
OUTPATIENT PHYSICAL THERAPY ONCOLOGY TREATMENT  Patient Name: VICKEE [name] MRN: [id number] DOB:05-18-1940, 81 y.o., female Today's Date: 01/15/2022     PT End of Session - 01/15/22 1359     Visit Number 13    Number of Visits 22    Date for PT Re-Evaluation 02/02/22    PT Start Time 1401    Activity Tolerance Patient tolerated treatment well    Behavior During Therapy [facility] for tasks assessed/performed               Past Medical History:  Diagnosis Date   ANEMIA-NOS    ANXIETY    Breast cancer ([place]) 1997 L, 2012 R   s/p chemo/xrt   COPD    resolved   DIVERTICULOSIS, COLON 2008   Dizziness    Family history of breast cancer    Family history of lung cancer    Family history of lymphoma    Family history of pancreatic cancer    Family history of uterine cancer    GERD    Hx of radiation therapy 10/19/11 -12/03/11   right breast   HYPERLIPIDEMIA    IRRITABLE BOWEL SYNDROME, HX OF    Left-sided carotid artery disease (HCC)    moderate left ICA stenosis   OSTEOARTHRITIS, HAND    PSVT (paroxysmal supraventricular tachycardia)    symptomatic on event monitor   Past Surgical History:  Procedure Laterality Date   ABDOMINAL HYSTERECTOMY     APPENDECTOMY     BREAST BIOPSY  11/14/10    r breast: inv, insitu mammary carcinoma w/calcif, er/pr +, her2 -   BREAST SURGERY     lumpectomy   CATARACT EXTRACTION     both eyes   ELECTROPHYSIOLOGIC STUDY N/A 05/10/2015   Procedure: SVT Ablation;  Surgeon: Will Meredith Leeds, MD;  Location: [facility];  Service: Cardiovascular;  Laterality: N/A;   HERNIA REPAIR     inguinal herniorrhapy  1984   left   IR US GUIDE BX ASP/DRAIN  05/26/2019   rectal fissure repair     s/p benign breast biopsy  2003   right   s/p left foot surgury  2009   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   spiral fx left foot  2008   no surgury   TMJ ARTHROPLASTY  1989   TONGUE SURGERY     1988- to remove scar tissue  growth    TONSILLECTOMY     Patient Active Problem List   Diagnosis Date Noted   Acute upper respiratory infection 05/16/2021   Nasal dryness 02/04/2021   Chronic sinusitis 11/25/2020   Allergic rhinitis 07/08/2020   Hypomagnesemia 06/10/2020   Goals of care, counseling/discussion 06/21/2019   Family history of breast cancer    Family history of pancreatic cancer    Family history of uterine cancer    Family history of lymphoma    Family history of lung cancer    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[place]) 06/02/2019   Recurrent cancer of left breast ([place]) 05/15/2019   Pes anserine bursitis 03/31/2019   HTN (hypertension) 02/10/2019   Hyperglycemia 06/21/2018   Left carpal tunnel syndrome 02/22/2018   Rotator cuff arthropathy of left shoulder 09/20/2017   Arthritis of hand 08/23/2017   Pain in right hand 08/23/2017   Cervical radiculitis 04/09/2017   Left shoulder pain 04/09/2017   Dyspnea on exertion 05/14/2016   History of ductal  carcinoma in situ (DCIS) of breast 01/14/2016   Lymphedema of left arm 01/14/2016   Left arm swelling 12/25/2015   SVT (supraventricular tachycardia)    Left-sided carotid artery disease ([place]) 03/08/2015   Dizziness 01/24/2015   Urinary frequency 01/13/2015   Dizziness and giddiness 01/09/2015   Gallstones 02/21/2013   Malignant neoplasm of upper-inner quadrant of left breast in female, estrogen receptor positive ([place]) 11/28/2012   Muscle cramping 09/12/2012   Right hip pain 09/12/2012   Lower back pain 09/12/2012   COPD GOLD I     Hx of radiation therapy    Right shoulder pain 09/14/2011   Preventative health care 07/29/2010   Skin lesion of left leg 07/29/2010   Paresthesia 07/29/2010   GERD 03/28/2010   CONSTIPATION 03/28/2010   Osteoarthrosis, 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   Anxiety state 05/03/2009   Hyperlipidemia 06/14/2007   FATIGUE 06/14/2007   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  Rationale for Evaluation and Treatment Rehabilitation  SUBJECTIVE:                                                                                                                                                                                          SUBJECTIVE STATEMENT: I wore my Circaid last night and my cousin helped me put it on. It felt a little tight under the arm pit but it may have been because of my T shirt. Primitivo Gauze said the company would call me for my payment.   PERTINENT HISTORY:  Hx breast cancer with 15 lymph node removal on the L and Bil lumpectomy. Lumpectomy on the L in 1997and the R in 2012. Radiation Bil.  New discovery of metastatic breast Cancer presently on Palbocidib and Anastrozole,  Fulvestrant since 06/2019.Recent PET scan on 10/09/2021 showed no evidence of recurrent or metastatic disease. Prior lymphedema treatment for Left UE in 2021  PAIN:  Are you having pain? No pain today NPRS scale: pt did not rate on scale Pain location: wrist and hand on left  Pain orientation: Left  PAIN TYPE: stabbing Pain description: intermittent  Aggravating factors: evening Relieving factors: Tribute wrap but doesn't wear the hand piece.  PRECAUTIONS: Other: Left UE lymphedema , Metastatic breast Cancer, COPD  WEIGHT BEARING RESTRICTIONS: No  FALLS:  Has patient fallen in last 6 months? No  LIVING ENVIRONMENT: Lives with: lives alone Lives in: House/apartment Stairs: No; External: 1 steps; on right going up Has following equipment at home: None  OCCUPATION: retired  LEISURE: reading, puzzles, walks very little  HAND DOMINANCE: right    PRIOR LEVEL OF FUNCTION: Independent  PATIENT GOALS: Try and getting swelling in left arm down. Get a new compression sleeve that fits properly   OBJECTIVE:  COGNITION: Overall cognitive status: Within functional limits for tasks assessed   PALPATION: Mild pitting edema ulnar border of Left UE  OBSERVATIONS / OTHER ASSESSMENTS: Bandaid on forearm with small amount of clear  drainage showing through. Other aspect of incision that can be viewed is healed. Sensation to light touch in the hand is intact. Dorsum of hand is slightly swollen, and upper arm swollen above where she had TG soft folded over and with bottom folded over several inches, may be making hand swell. She complains of left wrist pain worse at night and has a weak left grip  SENSATION: Light touch: Appears intact throughout left UE   POSTURE: forward head, rounded shoulders  UPPER EXTREMITY AROM/PROM:  A/PROM RIGHT   eval   Shoulder extension   Shoulder flexion   Shoulder abduction   Shoulder internal rotation   Shoulder external rotation     (Blank rows = not tested)  A/PROM LEFT   eval  Shoulder extension   Shoulder flexion   Shoulder abduction   Shoulder internal rotation   Shoulder external rotation     (Blank rows = not tested)  CERVICAL AROM: All within functional limits:   UPPER EXTREMITY STRENGTH: NT  LYMPHEDEMA ASSESSMENTS:   SURGERY TYPE/DATE: Left breast Lumpectomy 1997, Right lumpectomy 2012  NUMBER OF LYMPH NODES REMOVED: 15 on left UE  CHEMOTHERAPY:Yes, presently on medications for metastasis  RADIATION:bilateral   HORMONE TREATMENT: yes  INFECTIONS: yes  LYMPHEDEMA ASSESSMENTS:   LANDMARK RIGHT  eval  10 cm proximal to olecranon process 27.5  Olecranon process 22.4  10 cm proximal to ulnar styloid process 19.0  Just proximal to ulnar styloid process 15.2  Across hand at thumb web space 17.9  At base of 2nd digit 6.3  (Blank rows = not tested)  LANDMARK LEFT  eval  LEFT 12/18/2021 LEFT 12/24/2021 LEFT 12/29/21 LEFT 01/01/2022  10 cm proximal to olecranon process 28.2 29.0 28.6 28.5 28  Olecranon process 27.5 25.3 25.6 25.5 25.6  10 cm proximal to ulnar styloid process 24.0 21 21.6 21.8 21.3  Just proximal to ulnar styloid process 18.2 17.7 18.2 17.5 17.7  Across hand at thumb web space 18.5 18.0 18.3 17.5 17.7  At base of 2nd digit 6.5 6.3 6.3 6.4 6.3  (Blank rows = not tested)  Wrist   16.4     Elbow  26           axilla  29.2,     MCPS17.3    wrist 16.8  (SLEEVE MEASUREMENTS)    LLIS : 47%   TODAY'S TREATMENT:  DATE  01/15/2022 Manual lymph drainage in supine as follows: short neck, (no diaphragmatic breathing due to shortness of breath from medication) , left inguinal nodes, left axillo-inguinal anastamoses. Left upper extremity working proximal to distal then retracing all steps being very careful around area on forearm where pt recently had surgery. Compression bandaging: TG soft from hand to axilla, mollelast to digits 1-4 covered with paper tape, artiflex from hand to axilla, 1 6cm bandage at hand, 1 8 cm bandage from wrist to axilla in herringbone, 1 12 cm bandage from hand to axilla with spiral.    01/12/2022 Discussed compression sleeves and option for custom flat knit vs. Circular knit like Bella Strong. Pt has opted for custom flat knit and will be measured by Primitivo Gauze on Wed. Manual lymph drainage in supine as follows: short neck, (no diaphragmatic breathing due to shortness of breath from medication) , left inguinal nodes, left axillo-inguinal anastamoses. Left upper extremity working proximal to distal then retracing all steps being very careful around area on forearm where pt recently had surgery. Compression bandaging: TG soft from hand to axilla, mollelast to digits 1-4 covered with paper tape, artiflex from hand  to axilla, 1 6cm bandage at hand, 1 8 cm bandage from wrist to axilla in herringbone, 1 12 cm bandage from hand to axilla with spiral.   01/08/2022 Pt brought all of her garments with her. Showed pt how to don Circaid Night garment using tabs on sleeve. She had some difficulty but when taking her time was able to get it on properly. She could not get power sleeve on and did not like it. Tried donning class 2 Medi Esprit day sleeve but pt was unable to get it over her wrist. Also tried EZ Slide and it was better, but pt was still not able to do independently and did not think she would be able to due to weakness in her right hand. Due to time running out told pt I will review other options for sleeve and will discuss with her next visit. She will return the [employer] sleeves. Compression bandaging: TG soft from hand to axilla, mollelast to digits 1-4 covered with paper tape, artiflex from hand to axilla, 1 6cm bandage at hand, 1 8 cm bandage from wrist to axilla in herringbone, 1 12 cm bandage from hand to axilla with spiral.  01/05/2022 Measured pt for CircAid Profile night time garment and she fit in to a size 2. Educated pt that it is important she has a well fitting nighttime garment to keep fluid from collecting in her arm overnight. Her current garment does not fit well and is too short. Assisted pt with ordering a Medi Mondi Espirit size 1 sleeve and glove for day time management.  Manual lymph drainage in supine as follows: short neck, (no diaphragmatic breathing due to shortness of breath from medication) , left inguinal nodes, left axillo-inguinal anastamoses. Left upper extremity working proximal to distal then retracing all steps being very careful around area on forearm where pt recently had surgery. Compression bandaging: TG soft from hand to axilla, mollelast to digits 1-4 covered with paper tape, artiflex from hand to axilla, 1 6cm bandage at hand, 1 8 cm bandage from wrist to axilla in  herringbone, 1 12 cm bandage from hand to axilla with spiral.  01/01/2022 Pt brought in old Juzo sleeve that is too big at the top. Class 1 circular knit Size IV. She does not have a handpiece. Also brought in her  tribute night without the hand piece. Pt has tribute very loose so not really helping much, but she is not able to tighten the Velcro straps with it on. Sleeve is too short. Adjusted velcro straps to make slightly tighter. We may have an oversleeve in the back that would help her get it slightly snugger. Remeasured circumferences for PT note and compression sleeve/gauntlet. Manual lymph drainage in supine as follows: short neck, (no diaphragmatic breathing due to shortness of breath from medication) , left inguinal nodes, left axillo-inguinal anastamoses. Left upper extremity working proximal to distal then retracing all steps being very careful around area on forearm where pt recently had surgery. Compression bandaging: TG soft from hand to axilla, mollelast to digits 1-4 covered with paper tape, artiflex from hand to axilla, 1 6cm bandage at hand, 1 8 cm bandage from wrist to axilla in herringbone, 1 12 cm bandage from hand to axilla with spiral. Pt does not fit into a Juzo off the shelf . Searched garments after pt left. She does fit into Size 1 Medi Mondi Esprit sleeve/glove standard length, or a Medi Harmony size 2 glove, Exostrong sleeve small short May want to wait until January to consider a Circaid night  12/29/2021 Remeasured circumferences Pt measured with slight increases throughout Manual lymph drainage in supine as follows: short neck, (no diaphragmatic breathing due to shortness of breath from medication) , left inguinal nodes, left axillo-inguinal anastamoses. Left upper extremity working proximal to distal then retracing all steps being very careful around area on forearm where pt recently had surgery. Compression bandaging: New TG soft from hand to axilla, mollelast to digits  1-4 covered with paper tape, artiflex from hand to axilla, 1 6cm bandage at hand, 1 8 cm bandage from wrist to axilla in herringbone, 1 12 cm bandage from hand to axilla with spiral. 12/24/2021 Pt measured with slight increases throughout Manual lymph drainage in supine as follows: short neck, (no diaphragmatic breathing due to shortness of breath from medication) , left inguinal nodes, left axillo-inguinal anastamoses. Left upper extremity working proximal to distal then retracing all steps being very careful around area on forearm where pt recently had surgery. Compression bandaging: New TG soft from hand to axilla, mollelast to digits 1-5 then , artiflex from hand to axilla, 1 6cm bandage at hand, 1 8 cm bandage from wrist to axilla in spiral, 1 12 cm bandage from hand to axilla with spiral. Checked capillary refill and was good  12/22/21 Manual lymph drainage in supine as follows: short neck, (no diaphragmatic breathing due to shortness of breath from medication) , left inguinal nodes, left axillo-inguinal anastamoses. Left upper extremity working proximal to distal then retracing all steps being very careful around area on forearm where pt recently had surgery. Compression bandaging:  TG soft from hand to axilla, mollelast to digits 1-5 then covered with paper tape, artiflex from hand to axilla, 1 6cm bandage at hand, 1 8 cm bandage from wrist to axilla in herringbone, 1 12 cm bandage from hand to axilla with spiral.    12/19/2021  Pt measured Manual lymph drainage in supine as follows: short neck, (no diaphragmatic breathing due to shortness of breath from medication) , left inguinal nodes, left axillo-inguinal anastamoses. Left upper extremity working proximal to distal then retracing all steps being very careful around area on forearm where pt recently had surgery. Compression bandaging:  TG soft from hand to axilla, mollelast to digits 1-5, artiflex from hand to axilla, 1 6cm bandage at  hand,  1 8 cm bandage from wrist to axilla in spiral, 1 12 cm bandage from hand to axilla with spiral.   Pt educated to try and wash wraps on Sunday evening and lay on rack to dry. Wear Tribute at night and in the am before coming to therapy later that day.  12/16/2021 Manual lymph drainage in supine as follows: short neck, (no diaphragmatic breathing due to shortness of breath from medication) , left inguinal nodes, left axillo-inguinal anastamoses. Left upper extremity working proximal to distal then retracing all steps being very careful around area on forearm where pt recently had surgery. Compression bandaging:  TG soft from hand to axilla, mollelast to digits 1-5, artiflex from hand to axilla, 1 6cm bandage at hand, 1 8 cm bandage from wrist to axilla in spiral, 1 12 cm bandage from hand to axilla with spiral.   Initially wrapped 8 cm wrap with herringbone with 10 cm spiral over it but was afraid it would be too tight sow rewrapped both in spiral.   12/11/2021 Measured pt at 10 cm above ulna styloid and pt had a 2 cm reduction Manual lymph drainage in supine as follows: short neck, (no diaphragmatic breathing due to shortness of breath from medication) , left inguinal nodes, left axillo-inguinal anastamoses. Left upper extremity working proximal to distal then retracing all steps being very careful around area on forearm where pt recently had surgery. Compression bandaging: TG soft from hand to axilla, mollelast to digits 1-5, artiflex from hand to axilla, 1 6cm bandage at hand, 1 8 cm bandage from wrist to axilla in herringbone, 1 12 cm bandage from hand to axilla. Pt advised 4 days is a long time to wear wraps. If they slide down or become uncomfortable she must remove. She may try to get her appts Mon-Thurs. Instead of Tues Thurs.  12/09/21: Manual lymph drainage in supine as follows: short neck, (no diaphragmatic breathing due to shortness of breath from medication) , left inguinal nodes, left  axillo-inguinal anastamoses. Left upper extremity working proximal to distal then retracing all steps being very careful around area on forearm where pt recently had surgery. Compression bandaging: TG soft from hand to axilla, mollelast to digits 1-5, artiflex from hand to axilla, 1 6cm bandage at hand, 1 8 cm bandage from wrist to axilla in herringbone, 1 12 cm bandage from hand to axilla  Eval: Discussed POC and wrapping with pt. Pt is agreeable to try wrapping for 2 weeks to see if her arm would reduce. Swelling is soft and with compliance should reduce so she can be fit for a new sleeve. Discussed caution folding over TG soft too much because of increasing the pressure possibly increasing swelling at hand and at upper arm.Marland Kitchen PATIENT EDUCATION:  Education details: discussed POC, bandaging, new garments, need for 3x/week, instruct family member for weekends if she would like. Person educated: Patient Education method: Explanation Education comprehension: verbalized understanding  HOME EXERCISE PROGRAM: None given today  ASSESSMENT:  CLINICAL IMPRESSION: Pt wore her night garment and found it very comfortable but had to help donning it. Arm looks good today but no impressions left from [place] since she removed earlier today. Awaiting new garments and should take 7-9 business days  OBJECTIVE IMPAIRMENTS: decreased activity tolerance, decreased knowledge of condition, decreased ROM, increased edema, impaired UE functional use, postural dysfunction, and pain.   ACTIVITY LIMITATIONS: lifting, dressing, and reach over head  PARTICIPATION LIMITATIONS: cleaning, yard work, and reaching activities  PERSONAL FACTORS:  3+ comorbidities: Breast Cancer s/p radiation and with metastasis, COPD, OA, current cancer meds  are also affecting patient's functional outcome.   REHAB POTENTIAL: Good  CLINICAL DECISION MAKING: Stable/uncomplicated  EVALUATION COMPLEXITY: Low  GOALS: Goals reviewed with  patient? Yes  SHORT TERM GOALS: Target date: 02/05/2022    Pt will be tolerant of compression bandaging Baseline: Goal status: MET  12/18/2021 2.  Pt will have reduction at olecranon and 10 cm prox to ulna by 1.5 cm Baseline: Goal status: MET 12/18/2021  LONG TERM GOALS: Target date: 02/26/2022    Pt will have decreased edema at olecranon and 10 cm prox to ulna styloid by 2.5 cm Baseline: 24.0 Goal status: MET 21.3 cm on 01/05/22  2.  Pt will be fit for appropriate compression sleeve Baseline:  Goal status: IN PROGRESS sleeve ordered 01/05/22 and night time garment and pt is awaiting arrival  3.  Pt will be independent with self care of lymphedema including day and night compression Baseline:  Goal status: IN PROGRESS  4.  Pt will improve LLIS to no greater than 30% to demonstrate improved function  Baseline:  Goal status: IN PROGRESS    PLAN:  PT FREQUENCY: 3x/week  PT DURATION: 4 weeks  PLANNED INTERVENTIONS: Therapeutic exercises, Patient/Family education, Self Care, Orthotic/Fit training, Manual lymph drainage, Compression bandaging, and Manual therapy  PLAN FOR NEXT SESSION:  Measure next visit,check shoulder ROM? Make goal prn, how were bandages?, continue CDT. Bandage 2 weeks and if not reducing discontinue, otherwise continue until plateau then fit for new garments.   Claris Pong, PT 01/15/2022, 2:00 PM

## 2022-01-16 ENCOUNTER — Inpatient Hospital Stay: Payer: Medicare Other

## 2022-01-16 ENCOUNTER — Inpatient Hospital Stay: Payer: Medicare Other | Attending: Adult Health

## 2022-01-16 DIAGNOSIS — Z79818 Long term (current) use of other agents affecting estrogen receptors and estrogen levels: Secondary | ICD-10-CM | POA: Diagnosis not present

## 2022-01-16 DIAGNOSIS — C50212 Malignant neoplasm of upper-inner quadrant of left female breast: Secondary | ICD-10-CM

## 2022-01-16 DIAGNOSIS — Z170 Estrogen receptor positive status [ER+]: Secondary | ICD-10-CM | POA: Diagnosis not present

## 2022-01-16 DIAGNOSIS — C7951 Secondary malignant neoplasm of bone: Secondary | ICD-10-CM

## 2022-01-16 DIAGNOSIS — C50211 Malignant neoplasm of upper-inner quadrant of right female breast: Secondary | ICD-10-CM | POA: Diagnosis not present

## 2022-01-16 LAB — CBC WITH DIFFERENTIAL (CANCER CENTER ONLY)
Abs Immature Granulocytes: 0 10*3/uL (ref 0.00–0.07)
Basophils Absolute: 0 10*3/uL (ref 0.0–0.1)
Basophils Relative: 1 %
Eosinophils Absolute: 0.1 10*3/uL (ref 0.0–0.5)
Eosinophils Relative: 2 %
HCT: 27 % — ABNORMAL LOW (ref 36.0–46.0)
Hemoglobin: 9.1 g/dL — ABNORMAL LOW (ref 12.0–15.0)
Immature Granulocytes: 0 %
Lymphocytes Relative: 22 %
Lymphs Abs: 0.6 10*3/uL — ABNORMAL LOW (ref 0.7–4.0)
MCH: 36.4 pg — ABNORMAL HIGH (ref 26.0–34.0)
MCHC: 33.7 g/dL (ref 30.0–36.0)
MCV: 108 fL — ABNORMAL HIGH (ref 80.0–100.0)
Monocytes Absolute: 0.6 10*3/uL (ref 0.1–1.0)
Monocytes Relative: 21 %
Neutro Abs: 1.5 10*3/uL — ABNORMAL LOW (ref 1.7–7.7)
Neutrophils Relative %: 54 %
Platelet Count: 132 10*3/uL — ABNORMAL LOW (ref 150–400)
RBC: 2.5 MIL/uL — ABNORMAL LOW (ref 3.87–5.11)
RDW: 14.8 % (ref 11.5–15.5)
WBC Count: 2.7 10*3/uL — ABNORMAL LOW (ref 4.0–10.5)
nRBC: 0 % (ref 0.0–0.2)

## 2022-01-16 LAB — CMP (CANCER CENTER ONLY)
ALT: 11 U/L (ref 0–44)
AST: 20 U/L (ref 15–41)
Albumin: 4.2 g/dL (ref 3.5–5.0)
Alkaline Phosphatase: 69 U/L (ref 38–126)
Anion gap: 11 (ref 5–15)
BUN: 30 mg/dL — ABNORMAL HIGH (ref 8–23)
CO2: 20 mmol/L — ABNORMAL LOW (ref 22–32)
Calcium: 9.9 mg/dL (ref 8.9–10.3)
Chloride: 108 mmol/L (ref 98–111)
Creatinine: 1.21 mg/dL — ABNORMAL HIGH (ref 0.44–1.00)
GFR, Estimated: 45 mL/min — ABNORMAL LOW (ref >60)
Glucose, Bld: 119 mg/dL — ABNORMAL HIGH (ref 70–99)
Potassium: 4.3 mmol/L (ref 3.5–5.1)
Sodium: 139 mmol/L (ref 135–145)
Total Bilirubin: 0.3 mg/dL (ref 0.3–1.2)
Total Protein: 7.7 g/dL (ref 6.5–8.1)

## 2022-01-16 LAB — MAGNESIUM: Magnesium: 1.2 mg/dL — ABNORMAL LOW (ref 1.7–2.4)

## 2022-01-16 MED ORDER — FULVESTRANT 250 MG/5ML IM SOSY
500.0000 mg | PREFILLED_SYRINGE | Freq: Once | INTRAMUSCULAR | Status: AC
  Administered 2022-01-16: 500 mg via INTRAMUSCULAR
  Filled 2022-01-16: qty 10

## 2022-01-17 LAB — CANCER ANTIGEN 27.29: CA 27.29: 49.2 U/mL — ABNORMAL HIGH (ref 0.0–38.6)

## 2022-01-20 ENCOUNTER — Ambulatory Visit: Payer: Medicare Other

## 2022-01-20 DIAGNOSIS — L603 Nail dystrophy: Secondary | ICD-10-CM | POA: Diagnosis not present

## 2022-01-20 DIAGNOSIS — I890 Lymphedema, not elsewhere classified: Secondary | ICD-10-CM | POA: Diagnosis not present

## 2022-01-20 DIAGNOSIS — L578 Other skin changes due to chronic exposure to nonionizing radiation: Secondary | ICD-10-CM | POA: Diagnosis not present

## 2022-01-20 DIAGNOSIS — C44619 Basal cell carcinoma of skin of left upper limb, including shoulder: Secondary | ICD-10-CM

## 2022-01-20 DIAGNOSIS — D225 Melanocytic nevi of trunk: Secondary | ICD-10-CM | POA: Diagnosis not present

## 2022-01-20 DIAGNOSIS — L820 Inflamed seborrheic keratosis: Secondary | ICD-10-CM | POA: Diagnosis not present

## 2022-01-20 DIAGNOSIS — Z86018 Personal history of other benign neoplasm: Secondary | ICD-10-CM | POA: Diagnosis not present

## 2022-01-20 DIAGNOSIS — L821 Other seborrheic keratosis: Secondary | ICD-10-CM | POA: Diagnosis not present

## 2022-01-20 DIAGNOSIS — C44629 Squamous cell carcinoma of skin of left upper limb, including shoulder: Secondary | ICD-10-CM | POA: Diagnosis not present

## 2022-01-20 DIAGNOSIS — L814 Other melanin hyperpigmentation: Secondary | ICD-10-CM | POA: Diagnosis not present

## 2022-01-20 NOTE — Therapy (Signed)
OUTPATIENT PHYSICAL THERAPY ONCOLOGY TREATMENT  Patient Name: Stacey [name] MRN: [id number] DOB:11-08-1940, 81 y.o., female Today's Date: 01/20/2022     PT End of Session - 01/20/22 1558     Visit Number 14    Number of Visits 22    Date for PT Re-Evaluation 02/02/22    PT Start Time 1600    PT Stop Time 1700    PT Time Calculation (min) 60 min    Activity Tolerance Patient tolerated treatment well    Behavior During Therapy [facility] for tasks assessed/performed               Past Medical History:  Diagnosis Date   ANEMIA-NOS    ANXIETY    Breast cancer ([place]) 1997 L, 2012 R   s/p chemo/xrt   COPD    resolved   DIVERTICULOSIS, COLON 2008   Dizziness    Family history of breast cancer    Family history of lung cancer    Family history of lymphoma    Family history of pancreatic cancer    Family history of uterine cancer    GERD    Hx of radiation therapy 10/19/11 -12/03/11   right breast   HYPERLIPIDEMIA    IRRITABLE BOWEL SYNDROME, HX OF    Left-sided carotid artery disease (HCC)    moderate left ICA stenosis   OSTEOARTHRITIS, HAND    PSVT (paroxysmal supraventricular tachycardia)    symptomatic on event monitor   Past Surgical History:  Procedure Laterality Date   ABDOMINAL HYSTERECTOMY     APPENDECTOMY     BREAST BIOPSY  11/14/10    r breast: inv, insitu mammary carcinoma w/calcif, er/pr +, her2 -   BREAST SURGERY     lumpectomy   CATARACT EXTRACTION     both eyes   ELECTROPHYSIOLOGIC STUDY N/A 05/10/2015   Procedure: SVT Ablation;  Surgeon: Will Meredith Leeds, MD;  Location: [facility];  Service: Cardiovascular;  Laterality: N/A;   HERNIA REPAIR     inguinal herniorrhapy  1984   left   IR US GUIDE BX ASP/DRAIN  05/26/2019   rectal fissure repair     s/p benign breast biopsy  2003   right   s/p left foot surgury  2009   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   spiral fx left foot  2008   no surgury   TMJ ARTHROPLASTY   1989   TONGUE SURGERY     1988- to remove scar tissue growth    TONSILLECTOMY     Patient Active Problem List   Diagnosis Date Noted   Acute upper respiratory infection 05/16/2021   Nasal dryness 02/04/2021   Chronic sinusitis 11/25/2020   Allergic rhinitis 07/08/2020   Hypomagnesemia 06/10/2020   Goals of care, counseling/discussion 06/21/2019   Family history of breast cancer    Family history of pancreatic cancer    Family history of uterine cancer    Family history of lymphoma    Family history of lung cancer    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[place]) 06/02/2019   Recurrent cancer of left breast ([place]) 05/15/2019   Pes anserine bursitis 03/31/2019   HTN (hypertension) 02/10/2019   Hyperglycemia 06/21/2018   Left carpal tunnel syndrome 02/22/2018   Rotator cuff arthropathy of left shoulder 09/20/2017   Arthritis of hand 08/23/2017   Pain in right hand 08/23/2017   Cervical radiculitis 04/09/2017  Left shoulder pain 04/09/2017   Dyspnea on exertion 05/14/2016   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   Lymphedema of left arm 01/14/2016   Left arm swelling 12/25/2015   SVT (supraventricular tachycardia)    Left-sided carotid artery disease ([place]) 03/08/2015   Dizziness 01/24/2015   Urinary frequency 01/13/2015   Dizziness and giddiness 01/09/2015   Gallstones 02/21/2013   Malignant neoplasm of upper-inner quadrant of left breast in female, estrogen receptor positive ([place]) 11/28/2012   Muscle cramping 09/12/2012   Right hip pain 09/12/2012   Lower back pain 09/12/2012   COPD GOLD I     Hx of radiation therapy    Right shoulder pain 09/14/2011   Preventative health care 07/29/2010   Skin lesion of left leg 07/29/2010   Paresthesia 07/29/2010   GERD 03/28/2010   CONSTIPATION 03/28/2010   Osteoarthrosis, 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   Anxiety state 05/03/2009   Hyperlipidemia 06/14/2007   FATIGUE 06/14/2007   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  Rationale for Evaluation and Treatment Rehabilitation  SUBJECTIVE:                                                                                                                                                                                          SUBJECTIVE STATEMENT:  I took the wraps off about 11:00 this am. I had a dermatology appt this afternoon   PERTINENT HISTORY:  Hx breast cancer with 15 lymph node removal on the L and Bil lumpectomy. Lumpectomy on the L in 1997and the R in 2012. Radiation Bil.  New discovery of metastatic breast Cancer presently on Palbocidib and Anastrozole,  Fulvestrant since 06/2019.Recent PET scan on 10/09/2021 showed no evidence of recurrent or metastatic disease. Prior lymphedema treatment for Left UE in 2021  PAIN:  Are you having pain? No pain today NPRS scale: pt did not rate on scale Pain location: wrist and hand on left  Pain orientation: Left  PAIN TYPE: stabbing Pain description: intermittent  Aggravating factors: evening Relieving factors: Tribute wrap but doesn't wear the hand piece.  PRECAUTIONS: Other: Left UE lymphedema , Metastatic breast Cancer, COPD  WEIGHT BEARING RESTRICTIONS: No  FALLS:  Has patient fallen in last 6 months? No  LIVING ENVIRONMENT:  Lives with: lives alone Lives in: House/apartment Stairs: No; External: 1 steps; on right going up Has following equipment at home: None  OCCUPATION: retired  LEISURE: reading, puzzles, walks very little  HAND DOMINANCE: right   PRIOR LEVEL OF FUNCTION: Independent  PATIENT GOALS: Try and  getting swelling in left arm down. Get a new compression sleeve that fits properly   OBJECTIVE:  COGNITION: Overall cognitive status: Within functional limits for tasks assessed   PALPATION: Mild pitting edema ulnar border of Left UE  OBSERVATIONS / OTHER ASSESSMENTS: Bandaid on forearm with small amount of clear  drainage showing through. Other aspect of incision that can be viewed is healed. Sensation to light touch in the hand is intact. Dorsum of hand is slightly swollen, and upper arm swollen above where she had TG soft folded over and with bottom folded over several inches, may be making hand swell. She complains of left wrist pain worse at night and has a weak left grip  SENSATION: Light touch: Appears intact throughout left UE   POSTURE: forward head, rounded shoulders  UPPER EXTREMITY AROM/PROM:  A/PROM RIGHT   eval   Shoulder extension   Shoulder flexion   Shoulder abduction   Shoulder internal rotation   Shoulder external rotation     (Blank rows = not tested)  A/PROM LEFT   eval  Shoulder extension   Shoulder flexion   Shoulder abduction   Shoulder internal rotation   Shoulder external rotation     (Blank rows = not tested)  CERVICAL AROM: All within functional limits:   UPPER EXTREMITY STRENGTH: NT  LYMPHEDEMA ASSESSMENTS:   SURGERY TYPE/DATE: Left breast Lumpectomy 1997, Right lumpectomy 2012  NUMBER OF LYMPH NODES REMOVED: 15 on left UE  CHEMOTHERAPY:Yes, presently on medications for metastasis  RADIATION:bilateral   HORMONE TREATMENT: yes  INFECTIONS: yes  LYMPHEDEMA ASSESSMENTS:   LANDMARK RIGHT  eval  10 cm proximal to olecranon process 27.5  Olecranon process 22.4  10 cm proximal to ulnar styloid process 19.0  Just proximal to ulnar styloid process 15.2  Across hand at thumb web space 17.9  At base of 2nd digit 6.3  (Blank rows = not tested)  LANDMARK LEFT  eval LEFT 12/18/2021 LEFT 12/24/2021 LEFT 12/29/21 LEFT 01/01/2022  LEFT 01/20/2022  10 cm proximal to olecranon process 28.2 29.0 28.6 28.5 28 28.3  Olecranon process 27.5 25.3 25.6 25.5 25.6 25.5  10 cm proximal to ulnar styloid process 24.0 21 21.6 21.8 21.3 21.3  Just proximal to ulnar styloid process 18.2 17.7 18.2 17.5 17.7 17.8  Across hand at thumb web space 18.5 18.0 18.3 17.5 17.7 17.6  At base of 2nd digit 6.5 6.3 6.3 6.4 6.3 6.35  (Blank rows = not tested)  Wrist   16.4     Elbow  26           axilla  29.2,     MCPS17.3    wrist 16.8  (SLEEVE MEASUREMENTS)    LLIS : 47%   TODAY'S TREATMENT:  DATE  01/20/2022 Manual lymph drainage in supine as follows: short neck, (no diaphragmatic breathing due to shortness of breath from medication) , left inguinal nodes, left axillo-inguinal anastamoses. Left upper extremity working proximal to distal then retracing all steps being very careful around area on forearm where pt recently had surgery. Pt remeasured with measurements remaining fairly stable Compression bandaging: TG soft from hand to axilla, mollelast to digits 1-4 covered with paper tape, artiflex from hand to axilla, 1 6cm bandage at hand, 1 8 cm bandage from wrist to axilla in herringbone, 1 12 cm bandage from hand to axilla with spiral.   01/15/2022 Manual lymph drainage in supine as follows: short neck, (no diaphragmatic breathing due to shortness of breath from medication) , left inguinal nodes, left axillo-inguinal anastamoses. Left upper extremity working proximal to distal then retracing all steps being very careful around area on forearm where pt recently had surgery. Compression bandaging: TG soft from hand to axilla, mollelast to digits 1-4 covered with paper tape, artiflex from hand to axilla, 1 6cm bandage at hand, 1 8 cm bandage from wrist to axilla in herringbone, 1 12 cm bandage from hand to axilla with  spiral.    01/12/2022 Discussed compression sleeves and option for custom flat knit vs. Circular knit like Bella Strong. Pt has opted for custom flat knit and will be measured by Primitivo Gauze on Wed. Manual lymph drainage in supine as follows: short neck, (no diaphragmatic breathing due to shortness of breath from medication) , left inguinal nodes, left axillo-inguinal anastamoses. Left upper extremity working proximal to distal then retracing all steps being very careful around area on forearm where pt recently had surgery. Compression bandaging: TG soft from hand to axilla, mollelast to digits 1-4 covered with paper tape, artiflex from hand to axilla, 1 6cm bandage at hand, 1 8 cm bandage from wrist to axilla in herringbone, 1 12 cm bandage from hand to axilla with spiral.   01/08/2022 Pt brought all of her garments with her. Showed pt how to don Circaid Night garment using tabs on sleeve. She had some difficulty but when taking her time was able to get it on properly. She could not get power sleeve on and did not like it. Tried donning class 2 Medi Esprit day sleeve but pt was unable to get it over her wrist. Also tried EZ Slide and it was better, but pt was still not able to do independently and did not think she would be able to due to weakness in her right hand. Due to time running out told pt I will review other options for sleeve and will discuss with her next visit. She will return the [employer] sleeves. Compression bandaging: TG soft from hand to axilla, mollelast to digits 1-4 covered with paper tape, artiflex from hand to axilla, 1 6cm bandage at hand, 1 8 cm bandage from wrist to axilla in herringbone, 1 12 cm bandage from hand to axilla with spiral.  01/05/2022 Measured pt for CircAid Profile night time garment and she fit in to a size 2. Educated pt that it is important she has a well fitting nighttime garment to keep fluid from collecting in her arm overnight. Her current garment does not  fit well and is too short. Assisted pt with ordering a Medi Mondi Espirit size 1 sleeve and glove for day time management.  Manual lymph drainage in supine as follows: short neck, (no diaphragmatic breathing due to shortness of breath from medication) , left  inguinal nodes, left axillo-inguinal anastamoses. Left upper extremity working proximal to distal then retracing all steps being very careful around area on forearm where pt recently had surgery. Compression bandaging: TG soft from hand to axilla, mollelast to digits 1-4 covered with paper tape, artiflex from hand to axilla, 1 6cm bandage at hand, 1 8 cm bandage from wrist to axilla in herringbone, 1 12 cm bandage from hand to axilla with spiral.  01/01/2022 Pt brought in old Juzo sleeve that is too big at the top. Class 1 circular knit Size IV. She does not have a handpiece. Also brought in her tribute night without the hand piece. Pt has tribute very loose so not really helping much, but she is not able to tighten the Velcro straps with it on. Sleeve is too short. Adjusted velcro straps to make slightly tighter. We may have an oversleeve in the back that would help her get it slightly snugger. Remeasured circumferences for PT note and compression sleeve/gauntlet. Manual lymph drainage in supine as follows: short neck, (no diaphragmatic breathing due to shortness of breath from medication) , left inguinal nodes, left axillo-inguinal anastamoses. Left upper extremity working proximal to distal then retracing all steps being very careful around area on forearm where pt recently had surgery. Compression bandaging: TG soft from hand to axilla, mollelast to digits 1-4 covered with paper tape, artiflex from hand to axilla, 1 6cm bandage at hand, 1 8 cm bandage from wrist to axilla in herringbone, 1 12 cm bandage from hand to axilla with spiral. Pt does not fit into a Juzo off the shelf . Searched garments after pt left. She does fit into Size 1 Medi Mondi  Esprit sleeve/glove standard length, or a Medi Harmony size 2 glove, Exostrong sleeve small short May want to wait until January to consider a Circaid night  12/29/2021 Remeasured circumferences Pt measured with slight increases throughout Manual lymph drainage in supine as follows: short neck, (no diaphragmatic breathing due to shortness of breath from medication) , left inguinal nodes, left axillo-inguinal anastamoses. Left upper extremity working proximal to distal then retracing all steps being very careful around area on forearm where pt recently had surgery. Compression bandaging: New TG soft from hand to axilla, mollelast to digits 1-4 covered with paper tape, artiflex from hand to axilla, 1 6cm bandage at hand, 1 8 cm bandage from wrist to axilla in herringbone, 1 12 cm bandage from hand to axilla with spiral. 12/24/2021 Pt measured with slight increases throughout Manual lymph drainage in supine as follows: short neck, (no diaphragmatic breathing due to shortness of breath from medication) , left inguinal nodes, left axillo-inguinal anastamoses. Left upper extremity working proximal to distal then retracing all steps being very careful around area on forearm where pt recently had surgery. Compression bandaging: New TG soft from hand to axilla, mollelast to digits 1-5 then , artiflex from hand to axilla, 1 6cm bandage at hand, 1 8 cm bandage from wrist to axilla in spiral, 1 12 cm bandage from hand to axilla with spiral. Checked capillary refill and was good  12/22/21 Manual lymph drainage in supine as follows: short neck, (no diaphragmatic breathing due to shortness of breath from medication) , left inguinal nodes, left axillo-inguinal anastamoses. Left upper extremity working proximal to distal then retracing all steps being very careful around area on forearm where pt recently had surgery. Compression bandaging:  TG soft from hand to axilla, mollelast to digits 1-5 then covered with paper  tape, artiflex from  hand to axilla, 1 6cm bandage at hand, 1 8 cm bandage from wrist to axilla in herringbone, 1 12 cm bandage from hand to axilla with spiral.    12/19/2021  Pt measured Manual lymph drainage in supine as follows: short neck, (no diaphragmatic breathing due to shortness of breath from medication) , left inguinal nodes, left axillo-inguinal anastamoses. Left upper extremity working proximal to distal then retracing all steps being very careful around area on forearm where pt recently had surgery. Compression bandaging:  TG soft from hand to axilla, mollelast to digits 1-5, artiflex from hand to axilla, 1 6cm bandage at hand, 1 8 cm bandage from wrist to axilla in spiral, 1 12 cm bandage from hand to axilla with spiral.   Pt educated to try and wash wraps on Sunday evening and lay on rack to dry. Wear Tribute at night and in the am before coming to therapy later that day.  12/16/2021 Manual lymph drainage in supine as follows: short neck, (no diaphragmatic breathing due to shortness of breath from medication) , left inguinal nodes, left axillo-inguinal anastamoses. Left upper extremity working proximal to distal then retracing all steps being very careful around area on forearm where pt recently had surgery. Compression bandaging:  TG soft from hand to axilla, mollelast to digits 1-5, artiflex from hand to axilla, 1 6cm bandage at hand, 1 8 cm bandage from wrist to axilla in spiral, 1 12 cm bandage from hand to axilla with spiral.   Initially wrapped 8 cm wrap with herringbone with 10 cm spiral over it but was afraid it would be too tight sow rewrapped both in spiral.   12/11/2021 Measured pt at 10 cm above ulna styloid and pt had a 2 cm reduction Manual lymph drainage in supine as follows: short neck, (no diaphragmatic breathing due to shortness of breath from medication) , left inguinal nodes, left axillo-inguinal anastamoses. Left upper extremity working proximal to distal then  retracing all steps being very careful around area on forearm where pt recently had surgery. Compression bandaging: TG soft from hand to axilla, mollelast to digits 1-5, artiflex from hand to axilla, 1 6cm bandage at hand, 1 8 cm bandage from wrist to axilla in herringbone, 1 12 cm bandage from hand to axilla. Pt advised 4 days is a long time to wear wraps. If they slide down or become uncomfortable she must remove. She may try to get her appts Mon-Thurs. Instead of Tues Thurs.  12/09/21: Manual lymph drainage in supine as follows: short neck, (no diaphragmatic breathing due to shortness of breath from medication) , left inguinal nodes, left axillo-inguinal anastamoses. Left upper extremity working proximal to distal then retracing all steps being very careful around area on forearm where pt recently had surgery. Compression bandaging: TG soft from hand to axilla, mollelast to digits 1-5, artiflex from hand to axilla, 1 6cm bandage at hand, 1 8 cm bandage from wrist to axilla in herringbone, 1 12 cm bandage from hand to axilla  Eval: Discussed POC and wrapping with pt. Pt is agreeable to try wrapping for 2 weeks to see if her arm would reduce. Swelling is soft and with compliance should reduce so she can be fit for a new sleeve. Discussed caution folding over TG soft too much because of increasing the pressure possibly increasing swelling at hand and at upper arm.Marland Kitchen PATIENT EDUCATION:  Education details: discussed POC, bandaging, new garments, need for 3x/week, instruct family member for weekends if she would like. Person  educated: Patient Education method: Explanation Education comprehension: verbalized understanding  HOME EXERCISE PROGRAM: None given today  ASSESSMENT:  CLINICAL IMPRESSION:  Pt was remeasured  today with measurements staying very stable. Pt is awaiting new compression garment and will require skilled PT until she receives garment.  OBJECTIVE IMPAIRMENTS: decreased activity  tolerance, decreased knowledge of condition, decreased ROM, increased edema, impaired UE functional use, postural dysfunction, and pain.   ACTIVITY LIMITATIONS: lifting, dressing, and reach over head  PARTICIPATION LIMITATIONS: cleaning, yard work, and reaching activities  PERSONAL FACTORS: 3+ comorbidities: Breast Cancer s/p radiation and with metastasis, COPD, OA, current cancer meds  are also affecting patient's functional outcome.   REHAB POTENTIAL: Good  CLINICAL DECISION MAKING: Stable/uncomplicated  EVALUATION COMPLEXITY: Low  GOALS: Goals reviewed with patient? Yes  SHORT TERM GOALS: Target date: 02/10/2022    Pt will be tolerant of compression bandaging Baseline: Goal status: MET  12/18/2021 2.  Pt will have reduction at olecranon and 10 cm prox to ulna by 1.5 cm Baseline: Goal status: MET 12/18/2021  LONG TERM GOALS: Target date: 03/03/2022    Pt will have decreased edema at olecranon and 10 cm prox to ulna styloid by 2.5 cm Baseline: 24.0 Goal status: MET 21.3 cm on 01/05/22  2.  Pt will be fit for appropriate compression sleeve Baseline:  Goal status: IN PROGRESS sleeve ordered 01/05/22 and night time garment and pt is awaiting arrival  3.  Pt will be independent with self care of lymphedema including day and night compression Baseline:  Goal status: IN PROGRESS  4.  Pt will improve LLIS to no greater than 30% to demonstrate improved function  Baseline:  Goal status: IN PROGRESS    PLAN:  PT FREQUENCY: 3x/week  PT DURATION: 4 weeks  PLANNED INTERVENTIONS: Therapeutic exercises, Patient/Family education, Self Care, Orthotic/Fit training, Manual lymph drainage, Compression bandaging, and Manual therapy  PLAN FOR NEXT SESSION:  Measure next visit,Make goal prn, how were bandages?, continue CDT. Awaiting new daytime sleeve, has already received [place], PT 01/20/2022, 5:09 PM

## 2022-01-23 ENCOUNTER — Ambulatory Visit: Payer: Medicare Other

## 2022-01-27 ENCOUNTER — Ambulatory Visit: Payer: Medicare Other

## 2022-01-27 DIAGNOSIS — C44619 Basal cell carcinoma of skin of left upper limb, including shoulder: Secondary | ICD-10-CM

## 2022-01-27 DIAGNOSIS — I890 Lymphedema, not elsewhere classified: Secondary | ICD-10-CM | POA: Diagnosis not present

## 2022-01-27 DIAGNOSIS — C44629 Squamous cell carcinoma of skin of left upper limb, including shoulder: Secondary | ICD-10-CM

## 2022-01-27 NOTE — Therapy (Signed)
OUTPATIENT PHYSICAL THERAPY ONCOLOGY TREATMENT  Patient Name: Stacey [name] MRN: [id number] DOB:1940-02-22, 81 y.o., female Today's Date: 01/27/2022     PT End of Session - 01/27/22 1500     Visit Number 15    Number of Visits 22    Date for PT Re-Evaluation 02/02/22    PT Start Time 1502    PT Stop Time 0814    PT Time Calculation (min) 53 min    Activity Tolerance Patient tolerated treatment well    Behavior During Therapy [facility] for tasks assessed/performed               Past Medical History:  Diagnosis Date   ANEMIA-NOS    ANXIETY    Breast cancer ([place]) 1997 L, 2012 R   s/p chemo/xrt   COPD    resolved   DIVERTICULOSIS, COLON 2008   Dizziness    Family history of breast cancer    Family history of lung cancer    Family history of lymphoma    Family history of pancreatic cancer    Family history of uterine cancer    GERD    Hx of radiation therapy 10/19/11 -12/03/11   right breast   HYPERLIPIDEMIA    IRRITABLE BOWEL SYNDROME, HX OF    Left-sided carotid artery disease (HCC)    moderate left ICA stenosis   OSTEOARTHRITIS, HAND    PSVT (paroxysmal supraventricular tachycardia)    symptomatic on event monitor   Past Surgical History:  Procedure Laterality Date   ABDOMINAL HYSTERECTOMY     APPENDECTOMY     BREAST BIOPSY  11/14/10    r breast: inv, insitu mammary carcinoma w/calcif, er/pr +, her2 -   BREAST SURGERY     lumpectomy   CATARACT EXTRACTION     both eyes   ELECTROPHYSIOLOGIC STUDY N/A 05/10/2015   Procedure: SVT Ablation;  Surgeon: Will Meredith Leeds, MD;  Location: [facility];  Service: Cardiovascular;  Laterality: N/A;   HERNIA REPAIR     inguinal herniorrhapy  1984   left   IR US GUIDE BX ASP/DRAIN  05/26/2019   rectal fissure repair     s/p benign breast biopsy  2003   right   s/p left foot surgury  2009   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   spiral fx left foot  2008   no surgury   TMJ ARTHROPLASTY   1989   TONGUE SURGERY     1988- to remove scar tissue growth    TONSILLECTOMY     Patient Active Problem List   Diagnosis Date Noted   Acute upper respiratory infection 05/16/2021   Nasal dryness 02/04/2021   Chronic sinusitis 11/25/2020   Allergic rhinitis 07/08/2020   Hypomagnesemia 06/10/2020   Goals of care, counseling/discussion 06/21/2019   Family history of breast cancer    Family history of pancreatic cancer    Family history of uterine cancer    Family history of lymphoma    Family history of lung cancer    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[place]) 06/02/2019   Recurrent cancer of left breast ([place]) 05/15/2019   Pes anserine bursitis 03/31/2019   HTN (hypertension) 02/10/2019   Hyperglycemia 06/21/2018   Left carpal tunnel syndrome 02/22/2018   Rotator cuff arthropathy of left shoulder 09/20/2017   Arthritis of hand 08/23/2017   Pain in right hand 08/23/2017   Cervical radiculitis 04/09/2017  Left shoulder pain 04/09/2017   Dyspnea on exertion 05/14/2016   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   Lymphedema of left arm 01/14/2016   Left arm swelling 12/25/2015   SVT (supraventricular tachycardia)    Left-sided carotid artery disease ([place]) 03/08/2015   Dizziness 01/24/2015   Urinary frequency 01/13/2015   Dizziness and giddiness 01/09/2015   Gallstones 02/21/2013   Malignant neoplasm of upper-inner quadrant of left breast in female, estrogen receptor positive ([place]) 11/28/2012   Muscle cramping 09/12/2012   Right hip pain 09/12/2012   Lower back pain 09/12/2012   COPD GOLD I     Hx of radiation therapy    Right shoulder pain 09/14/2011   Preventative health care 07/29/2010   Skin lesion of left leg 07/29/2010   Paresthesia 07/29/2010   GERD 03/28/2010   CONSTIPATION 03/28/2010   Osteoarthrosis, 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   Anxiety state 05/03/2009   Hyperlipidemia 06/14/2007   FATIGUE 06/14/2007   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  Rationale for Evaluation and Treatment Rehabilitation  SUBJECTIVE:                                                                                                                                                                                          SUBJECTIVE STATEMENT:  I took the wraps off about 12:30 today.  They were on for a week because I had to cancel my Friday appointment. The 1 finger came off yesterday but otherwise the wrap stayed very intact.   PERTINENT HISTORY:  Hx breast cancer with 15 lymph node removal on the L and Bil lumpectomy. Lumpectomy on the L in 1997and the R in 2012. Radiation Bil.  New discovery of metastatic breast Cancer presently on Palbocidib and Anastrozole,  Fulvestrant since 06/2019.Recent PET scan on 10/09/2021 showed no evidence of recurrent or metastatic disease. Prior lymphedema treatment for Left UE in 2021  PAIN:  Are you having pain? No pain today NPRS scale: pt did not rate on scale Pain location: wrist and hand on left  Pain orientation: Left  PAIN TYPE: stabbing Pain description: intermittent  Aggravating factors: evening Relieving factors: Tribute wrap but doesn't wear the hand piece.  PRECAUTIONS: Other: Left UE lymphedema , Metastatic breast Cancer,  COPD  WEIGHT BEARING RESTRICTIONS: No  FALLS:  Has patient fallen in last 6 months? No  LIVING ENVIRONMENT: Lives with: lives alone Lives in: House/apartment Stairs: No; External: 1 steps; on right going up Has following equipment at home: None  OCCUPATION: retired  LEISURE: reading, puzzles, walks  very little  HAND DOMINANCE: right   PRIOR LEVEL OF FUNCTION: Independent  PATIENT GOALS: Try and getting swelling in left arm down. Get a new compression sleeve that fits properly   OBJECTIVE:  COGNITION: Overall cognitive status: Within functional limits for tasks assessed   PALPATION: Mild pitting edema ulnar border of Left UE  OBSERVATIONS / OTHER ASSESSMENTS: Bandaid on forearm with small amount of clear  drainage showing through. Other aspect of incision that can be viewed is healed. Sensation to light touch in the hand is intact. Dorsum of hand is slightly swollen, and upper arm swollen above where she had TG soft folded over and with bottom folded over several inches, may be making hand swell. She complains of left wrist pain worse at night and has a weak left grip  SENSATION: Light touch: Appears intact throughout left UE   POSTURE: forward head, rounded shoulders  UPPER EXTREMITY AROM/PROM:  A/PROM RIGHT   eval   Shoulder extension   Shoulder flexion   Shoulder abduction   Shoulder internal rotation   Shoulder external rotation     (Blank rows = not tested)  A/PROM LEFT   eval  Shoulder extension   Shoulder flexion   Shoulder abduction   Shoulder internal rotation   Shoulder external rotation     (Blank rows = not tested)  CERVICAL AROM: All within functional limits:   UPPER EXTREMITY STRENGTH: NT  LYMPHEDEMA ASSESSMENTS:   SURGERY TYPE/DATE: Left breast Lumpectomy 1997, Right lumpectomy 2012  NUMBER OF LYMPH NODES REMOVED: 15 on left UE  CHEMOTHERAPY:Yes, presently on medications for metastasis  RADIATION:bilateral   HORMONE TREATMENT: yes  INFECTIONS: yes  LYMPHEDEMA ASSESSMENTS:   LANDMARK RIGHT  eval  10 cm proximal to olecranon process 27.5  Olecranon process 22.4  10 cm proximal to ulnar styloid process 19.0  Just proximal to ulnar styloid process 15.2  Across hand at thumb web space 17.9  At base of 2nd digit 6.3  (Blank rows  = not tested)  LANDMARK LEFT  eval LEFT 12/18/2021 LEFT 12/24/2021 LEFT 12/29/21 LEFT 01/01/2022 LEFT 01/20/2022  10 cm proximal to olecranon process 28.2 29.0 28.6 28.5 28 28.3  Olecranon process 27.5 25.3 25.6 25.5 25.6 25.5  10 cm proximal to ulnar styloid process 24.0 21 21.6 21.8 21.3 21.3  Just proximal to ulnar styloid process 18.2 17.7 18.2 17.5 17.7 17.8  Across hand at thumb web space 18.5 18.0 18.3 17.5 17.7 17.6  At base of 2nd digit 6.5 6.3 6.3 6.4 6.3 6.35  (Blank rows = not tested)  Wrist   16.4     Elbow  26           axilla  29.2,     MCPS17.3    wrist 16.8  (SLEEVE MEASUREMENTS)    LLIS : 47%   TODAY'S TREATMENT:  DATE  01/27/2022 Manual lymph drainage in supine as follows: short neck, (no diaphragmatic breathing due to shortness of breath from medication) , left inguinal nodes, left axillo-inguinal anastamoses. Left upper extremity working proximal to distal then retracing all steps being very careful around area on forearm where pt recently had surgery. Compression bandaging: TG soft from hand to axilla, mollelast to digits 1-4 , artiflex from hand to axilla, 1 6cm bandage at hand, 1 8 cm bandage from wrist to axilla in herringbone, 1 12 cm bandage from hand to axilla with spiral.    01/20/2022 Manual lymph drainage in supine as follows: short neck, (no diaphragmatic breathing due to shortness of breath from medication) , left inguinal nodes, left axillo-inguinal anastamoses. Left upper extremity working proximal to distal then retracing all steps being very careful around area on forearm where pt recently had surgery. Pt remeasured with measurements remaining fairly stable Compression bandaging: TG soft from hand to axilla, mollelast to digits 1-4 covered with paper tape, artiflex from hand to axilla, 1 6cm bandage at hand, 1 8 cm bandage from  wrist to axilla in herringbone, 1 12 cm bandage from hand to axilla with spiral.   01/15/2022 Manual lymph drainage in supine as follows: short neck, (no diaphragmatic breathing due to shortness of breath from medication) , left inguinal nodes, left axillo-inguinal anastamoses. Left upper extremity working proximal to distal then retracing all steps being very careful around area on forearm where pt recently had surgery. Compression bandaging: TG soft from hand to axilla, mollelast to digits 1-4 covered with paper tape, artiflex from hand to axilla, 1 6cm bandage at hand, 1 8 cm bandage from wrist to axilla in herringbone, 1 12 cm bandage from hand to axilla with spiral.    01/12/2022 Discussed compression sleeves and option for custom flat knit vs. Circular knit like Bella Strong. Pt has opted for custom flat knit and will be measured by Primitivo Gauze on Wed. Manual lymph drainage in supine as follows: short neck, (no diaphragmatic breathing due to shortness of breath from medication) , left inguinal nodes, left axillo-inguinal anastamoses. Left upper extremity working proximal to distal then retracing all steps being very careful around area on forearm where pt recently had surgery. Compression bandaging: TG soft from hand to axilla, mollelast to digits 1-4 covered with paper tape, artiflex from hand to axilla, 1 6cm bandage at hand, 1 8 cm bandage from wrist to axilla in herringbone, 1 12 cm bandage from hand to axilla with spiral.   01/08/2022 Pt brought all of her garments with her. Showed pt how to don Circaid Night garment using tabs on sleeve. She had some difficulty but when taking her time was able to get it on properly. She could not get power sleeve on and did not like it. Tried donning class 2 Medi Esprit day sleeve but pt was unable to get it over her wrist. Also tried EZ Slide and it was better, but pt was still not able to do independently and did not think she would be able to due to  weakness in her right hand. Due to time running out told pt I will review other options for sleeve and will discuss with her next visit. She will return the [employer] sleeves. Compression bandaging: TG soft from hand to axilla, mollelast to digits 1-4 covered with paper tape, artiflex from hand to axilla, 1 6cm bandage at hand, 1 8 cm bandage from wrist to axilla in herringbone, 1 12 cm bandage  from hand to axilla with spiral.  01/05/2022 Measured pt for CircAid Profile night time garment and she fit in to a size 2. Educated pt that it is important she has a well fitting nighttime garment to keep fluid from collecting in her arm overnight. Her current garment does not fit well and is too short. Assisted pt with ordering a Medi Mondi Espirit size 1 sleeve and glove for day time management.  Manual lymph drainage in supine as follows: short neck, (no diaphragmatic breathing due to shortness of breath from medication) , left inguinal nodes, left axillo-inguinal anastamoses. Left upper extremity working proximal to distal then retracing all steps being very careful around area on forearm where pt recently had surgery. Compression bandaging: TG soft from hand to axilla, mollelast to digits 1-4 covered with paper tape, artiflex from hand to axilla, 1 6cm bandage at hand, 1 8 cm bandage from wrist to axilla in herringbone, 1 12 cm bandage from hand to axilla with spiral.  01/01/2022 Pt brought in old Juzo sleeve that is too big at the top. Class 1 circular knit Size IV. She does not have a handpiece. Also brought in her tribute night without the hand piece. Pt has tribute very loose so not really helping much, but she is not able to tighten the Velcro straps with it on. Sleeve is too short. Adjusted velcro straps to make slightly tighter. We may have an oversleeve in the back that would help her get it slightly snugger. Remeasured circumferences for PT note and compression sleeve/gauntlet. Manual lymph drainage  in supine as follows: short neck, (no diaphragmatic breathing due to shortness of breath from medication) , left inguinal nodes, left axillo-inguinal anastamoses. Left upper extremity working proximal to distal then retracing all steps being very careful around area on forearm where pt recently had surgery. Compression bandaging: TG soft from hand to axilla, mollelast to digits 1-4 covered with paper tape, artiflex from hand to axilla, 1 6cm bandage at hand, 1 8 cm bandage from wrist to axilla in herringbone, 1 12 cm bandage from hand to axilla with spiral. Pt does not fit into a Juzo off the shelf . Searched garments after pt left. She does fit into Size 1 Medi [facility] Esprit sleeve/glove standard length, or a Medi Harmony size 2 glove, Exostrong sleeve small short May want to wait until January to consider a Circaid night Eval: Discussed POC and wrapping with pt. Pt is agreeable to try wrapping for 2 weeks to see if her arm would reduce. Swelling is soft and with compliance should reduce so she can be fit for a new sleeve. Discussed caution folding over TG soft too much because of increasing the pressure possibly increasing swelling at hand and at upper arm.Marland Kitchen PATIENT EDUCATION:  Education details: discussed POC, bandaging, new garments, need for 3x/week, instruct family member for weekends if she would like. Person educated: Patient Education method: Explanation Education comprehension: verbalized understanding  HOME EXERCISE PROGRAM: None given today  ASSESSMENT:  CLINICAL IMPRESSION: Pts wrap stayed intact except for 1 finger coming off yesterday despite being left on for a week secondary to pt cancelling her last appt. Pt is awaiting new compression garment and will require skilled PT  to help her maintain reduction until she receives  her garment for daily use.   OBJECTIVE IMPAIRMENTS: decreased activity tolerance, decreased knowledge of condition, decreased ROM, increased edema, impaired UE  functional use, postural dysfunction, and pain.   ACTIVITY LIMITATIONS: lifting, dressing, and reach  over head  PARTICIPATION LIMITATIONS: cleaning, yard work, and reaching activities  PERSONAL FACTORS: 3+ comorbidities: Breast Cancer s/p radiation and with metastasis, COPD, OA, current cancer meds  are also affecting patient's functional outcome.   REHAB POTENTIAL: Good  CLINICAL DECISION MAKING: Stable/uncomplicated  EVALUATION COMPLEXITY: Low  GOALS: Goals reviewed with patient? Yes  SHORT TERM GOALS: Target date: 02/17/2022    Pt will be tolerant of compression bandaging Baseline: Goal status: MET  12/18/2021 2.  Pt will have reduction at olecranon and 10 cm prox to ulna by 1.5 cm Baseline: Goal status: MET 12/18/2021  LONG TERM GOALS: Target date: 03/10/2022    Pt will have decreased edema at olecranon and 10 cm prox to ulna styloid by 2.5 cm Baseline: 24.0 Goal status: MET 21.3 cm on 01/05/22  2.  Pt will be fit for appropriate compression sleeve Baseline:  Goal status: IN PROGRESS sleeve ordered 01/05/22 and night time garment and pt is awaiting arrival  3.  Pt will be independent with self care of lymphedema including day and night compression Baseline:  Goal status: IN PROGRESS  4.  Pt will improve LLIS to no greater than 30% to demonstrate improved function  Baseline:  Goal status: IN PROGRESS    PLAN:  PT FREQUENCY: 3x/week  PT DURATION: 4 weeks  PLANNED INTERVENTIONS: Therapeutic exercises, Patient/Family education, Self Care, Orthotic/Fit training, Manual lymph drainage, Compression bandaging, and Manual therapy  PLAN FOR NEXT SESSION:  Measure next visit,Make goal prn, how were bandages?, continue CDT. Awaiting new daytime sleeve, has already received Circaid  Claris Pong, PT 01/27/2022, 4:15 PM

## 2022-01-29 ENCOUNTER — Ambulatory Visit: Payer: Medicare Other

## 2022-01-29 DIAGNOSIS — C44629 Squamous cell carcinoma of skin of left upper limb, including shoulder: Secondary | ICD-10-CM | POA: Diagnosis not present

## 2022-01-29 DIAGNOSIS — I890 Lymphedema, not elsewhere classified: Secondary | ICD-10-CM

## 2022-01-29 DIAGNOSIS — C44619 Basal cell carcinoma of skin of left upper limb, including shoulder: Secondary | ICD-10-CM | POA: Diagnosis not present

## 2022-01-29 NOTE — Therapy (Signed)
OUTPATIENT PHYSICAL THERAPY ONCOLOGY TREATMENT  Patient Name: Stacey [name] MRN: [id number] DOB:March 19, 1940, 81 y.o., female Today's Date: 01/29/2022     PT End of Session - 01/29/22 1358     Visit Number 16    Number of Visits 22    Date for PT Re-Evaluation 02/02/22    PT Start Time 1400    PT Stop Time 4709    PT Time Calculation (min) 58 min    Activity Tolerance Patient tolerated treatment well    Behavior During Therapy [facility] for tasks assessed/performed               Past Medical History:  Diagnosis Date   ANEMIA-NOS    ANXIETY    Breast cancer ([place]) 1997 L, 2012 R   s/p chemo/xrt   COPD    resolved   DIVERTICULOSIS, COLON 2008   Dizziness    Family history of breast cancer    Family history of lung cancer    Family history of lymphoma    Family history of pancreatic cancer    Family history of uterine cancer    GERD    Hx of radiation therapy 10/19/11 -12/03/11   right breast   HYPERLIPIDEMIA    IRRITABLE BOWEL SYNDROME, HX OF    Left-sided carotid artery disease (HCC)    moderate left ICA stenosis   OSTEOARTHRITIS, HAND    PSVT (paroxysmal supraventricular tachycardia)    symptomatic on event monitor   Past Surgical History:  Procedure Laterality Date   ABDOMINAL HYSTERECTOMY     APPENDECTOMY     BREAST BIOPSY  11/14/10    r breast: inv, insitu mammary carcinoma w/calcif, er/pr +, her2 -   BREAST SURGERY     lumpectomy   CATARACT EXTRACTION     both eyes   ELECTROPHYSIOLOGIC STUDY N/A 05/10/2015   Procedure: SVT Ablation;  Surgeon: Will Meredith Leeds, MD;  Location: [place] [facility];  Service: Cardiovascular;  Laterality: N/A;   HERNIA REPAIR     inguinal herniorrhapy  1984   left   IR US GUIDE BX ASP/DRAIN  05/26/2019   rectal fissure repair     s/p benign breast biopsy  2003   right   s/p left foot surgury  2009   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   spiral fx left foot  2008   no surgury   TMJ ARTHROPLASTY   1989   TONGUE SURGERY     1988- to remove scar tissue growth    TONSILLECTOMY     Patient Active Problem List   Diagnosis Date Noted   Acute upper respiratory infection 05/16/2021   Nasal dryness 02/04/2021   Chronic sinusitis 11/25/2020   Allergic rhinitis 07/08/2020   Hypomagnesemia 06/10/2020   Goals of care, counseling/discussion 06/21/2019   Family history of breast cancer    Family history of pancreatic cancer    Family history of uterine cancer    Family history of lymphoma    Family history of lung cancer    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[place]) 06/02/2019   Recurrent cancer of left breast ([place]) 05/15/2019   Pes anserine bursitis 03/31/2019   HTN (hypertension) 02/10/2019   Hyperglycemia 06/21/2018   Left carpal tunnel syndrome 02/22/2018   Rotator cuff arthropathy of left shoulder 09/20/2017   Arthritis of hand 08/23/2017   Pain in right hand 08/23/2017   Cervical radiculitis 04/09/2017  Left shoulder pain 04/09/2017   Dyspnea on exertion 05/14/2016   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   Lymphedema of left arm 01/14/2016   Left arm swelling 12/25/2015   SVT (supraventricular tachycardia)    Left-sided carotid artery disease ([place]) 03/08/2015   Dizziness 01/24/2015   Urinary frequency 01/13/2015   Dizziness and giddiness 01/09/2015   Gallstones 02/21/2013   Malignant neoplasm of upper-inner quadrant of left breast in female, estrogen receptor positive ([place]) 11/28/2012   Muscle cramping 09/12/2012   Right hip pain 09/12/2012   Lower back pain 09/12/2012   COPD GOLD I     Hx of radiation therapy    Right shoulder pain 09/14/2011   Preventative health care 07/29/2010   Skin lesion of left leg 07/29/2010   Paresthesia 07/29/2010   GERD 03/28/2010   CONSTIPATION 03/28/2010   Osteoarthrosis, 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   Anxiety state 05/03/2009   Hyperlipidemia 06/14/2007   FATIGUE 06/14/2007   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  Rationale for Evaluation and Treatment Rehabilitation  SUBJECTIVE:                                                                                                                                                                                          SUBJECTIVE STATEMENT:  I The bandages stayed on well but the index finger had to be cut off because it started hanging.    PERTINENT HISTORY:  Hx breast cancer with 15 lymph node removal on the L and Bil lumpectomy. Lumpectomy on the L in 1997and the R in 2012. Radiation Bil.  New discovery of metastatic breast Cancer presently on Palbocidib and Anastrozole,  Fulvestrant since 06/2019.Recent PET scan on 10/09/2021 showed no evidence of recurrent or metastatic disease. Prior lymphedema treatment for Left UE in 2021  PAIN:  Are you having pain? No pain today NPRS scale: pt did not rate on scale Pain location: wrist and hand on left  Pain orientation: Left  PAIN TYPE: stabbing Pain description: intermittent  Aggravating factors: evening Relieving factors: Tribute wrap but doesn't wear the hand piece.  PRECAUTIONS: Other: Left UE lymphedema , Metastatic breast Cancer, COPD  WEIGHT BEARING RESTRICTIONS: No  FALLS:  Has patient fallen in last 6 months?  No  LIVING ENVIRONMENT: Lives with: lives alone Lives in: House/apartment Stairs: No; External: 1 steps; on right going up Has following equipment at home: None  OCCUPATION: retired  LEISURE: reading, puzzles, walks very little  HAND DOMINANCE: right   PRIOR LEVEL OF FUNCTION:  Independent  PATIENT GOALS: Try and getting swelling in left arm down. Get a new compression sleeve that fits properly   OBJECTIVE:  COGNITION: Overall cognitive status: Within functional limits for tasks assessed   PALPATION: Mild pitting edema ulnar border of Left UE  OBSERVATIONS / OTHER ASSESSMENTS: Bandaid on forearm with small amount of clear  drainage showing through. Other aspect of incision that can be viewed is healed. Sensation to light touch in the hand is intact. Dorsum of hand is slightly swollen, and upper arm swollen above where she had TG soft folded over and with bottom folded over several inches, may be making hand swell. She complains of left wrist pain worse at night and has a weak left grip  SENSATION: Light touch: Appears intact throughout left UE   POSTURE: forward head, rounded shoulders  UPPER EXTREMITY AROM/PROM:  A/PROM RIGHT   eval   Shoulder extension   Shoulder flexion   Shoulder abduction   Shoulder internal rotation   Shoulder external rotation     (Blank rows = not tested)  A/PROM LEFT   eval  Shoulder extension   Shoulder flexion   Shoulder abduction   Shoulder internal rotation   Shoulder external rotation     (Blank rows = not tested)  CERVICAL AROM: All within functional limits:   UPPER EXTREMITY STRENGTH: NT  LYMPHEDEMA ASSESSMENTS:   SURGERY TYPE/DATE: Left breast Lumpectomy 1997, Right lumpectomy 2012  NUMBER OF LYMPH NODES REMOVED: 15 on left UE  CHEMOTHERAPY:Yes, presently on medications for metastasis  RADIATION:bilateral   HORMONE TREATMENT: yes  INFECTIONS: yes  LYMPHEDEMA ASSESSMENTS:   LANDMARK RIGHT  eval  10 cm proximal to olecranon process 27.5  Olecranon process 22.4  10 cm proximal to ulnar styloid process 19.0  Just proximal to ulnar styloid process 15.2  Across hand at thumb web space 17.9  At base of 2nd digit 6.3  (Blank rows = not tested)  LANDMARK LEFT  eval LEFT 12/18/2021  LEFT 12/24/2021 LEFT 12/29/21 LEFT 01/01/2022 LEFT 01/20/2022 LEFT 01/29/2022  10 cm proximal to olecranon process 28.2 29.0 28.6 28.5 28 28.3 28.0  Olecranon process 27.5 25.3 25.6 25.5 25.6 25.5 24.5  10 cm proximal to ulnar styloid process 24.0 21 21.6 21.8 21.3 21.3 20.6  Just proximal to ulnar styloid process 18.2 17.7 18.2 17.5 17.7 17.8 16.7  Across hand at thumb web space 18.5 18.0 18.3 17.5 17.7 17.6 17.7  At base of 2nd digit 6.5 6.3 6.3 6.4 6.3 6.35 6.2  (Blank rows = not tested)  Wrist   16.4     Elbow  26           axilla  29.2,     MCPS17.3    wrist 16.8  (SLEEVE MEASUREMENTS)    LLIS : 47%   TODAY'S TREATMENT:  DATE  01/29/2022 Wraps removed and pt remeasured Manual lymph drainage in supine as follows: short neck, (no diaphragmatic breathing due to shortness of breath from medication) , left inguinal nodes, left axillo-inguinal anastamoses. Left upper extremity working proximal to distal then retracing all steps being very careful around area on forearm where pt recently had surgery. Compression bandaging: TG soft from hand to axilla, mollelast to digits 1-4 , artiflex from hand to axilla, 1 6cm bandage at hand, 1 8 cm bandage from wrist to axilla in herringbone, 1 12 cm bandage from hand to axilla with spiral.   01/27/2022 Manual lymph drainage in supine as follows: short neck, (no diaphragmatic breathing due to shortness of breath from medication) , left inguinal nodes, left axillo-inguinal anastamoses. Left upper extremity working proximal to distal then retracing all steps being very careful around area on forearm where pt recently had surgery. Compression bandaging: TG soft from hand to axilla, mollelast to digits 1-4 , artiflex from hand to axilla, 1 6cm bandage at hand, 1 8 cm bandage from wrist to axilla in herringbone, 1 12 cm bandage from hand  to axilla with spiral.    01/20/2022 Manual lymph drainage in supine as follows: short neck, (no diaphragmatic breathing due to shortness of breath from medication) , left inguinal nodes, left axillo-inguinal anastamoses. Left upper extremity working proximal to distal then retracing all steps being very careful around area on forearm where pt recently had surgery. Pt remeasured with measurements remaining fairly stable Compression bandaging: TG soft from hand to axilla, mollelast to digits 1-4 covered with paper tape, artiflex from hand to axilla, 1 6cm bandage at hand, 1 8 cm bandage from wrist to axilla in herringbone, 1 12 cm bandage from hand to axilla with spiral.   01/15/2022 Manual lymph drainage in supine as follows: short neck, (no diaphragmatic breathing due to shortness of breath from medication) , left inguinal nodes, left axillo-inguinal anastamoses. Left upper extremity working proximal to distal then retracing all steps being very careful around area on forearm where pt recently had surgery. Compression bandaging: TG soft from hand to axilla, mollelast to digits 1-4 covered with paper tape, artiflex from hand to axilla, 1 6cm bandage at hand, 1 8 cm bandage from wrist to axilla in herringbone, 1 12 cm bandage from hand to axilla with spiral.    01/12/2022 Discussed compression sleeves and option for custom flat knit vs. Circular knit like Bella Strong. Pt has opted for custom flat knit and will be measured by Primitivo Gauze on Wed. Manual lymph drainage in supine as follows: short neck, (no diaphragmatic breathing due to shortness of breath from medication) , left inguinal nodes, left axillo-inguinal anastamoses. Left upper extremity working proximal to distal then retracing all steps being very careful around area on forearm where pt recently had surgery. Compression bandaging: TG soft from hand to axilla, mollelast to digits 1-4 covered with paper tape, artiflex from hand to axilla, 1  6cm bandage at hand, 1 8 cm bandage from wrist to axilla in herringbone, 1 12 cm bandage from hand to axilla with spiral.   01/08/2022 Pt brought all of her garments with her. Showed pt how to don Circaid Night garment using tabs on sleeve. She had some difficulty but when taking her time was able to get it on properly. She could not get power sleeve on and did not like it. Tried donning class 2 Medi Esprit day sleeve but pt was unable to get it over her wrist. Also  tried EZ Slide and it was better, but pt was still not able to do independently and did not think she would be able to due to weakness in her right hand. Due to time running out told pt I will review other options for sleeve and will discuss with her next visit. She will return the [employer] sleeves. Compression bandaging: TG soft from hand to axilla, mollelast to digits 1-4 covered with paper tape, artiflex from hand to axilla, 1 6cm bandage at hand, 1 8 cm bandage from wrist to axilla in herringbone, 1 12 cm bandage from hand to axilla with spiral.  01/05/2022 Measured pt for CircAid Profile night time garment and she fit in to a size 2. Educated pt that it is important she has a well fitting nighttime garment to keep fluid from collecting in her arm overnight. Her current garment does not fit well and is too short. Assisted pt with ordering a Medi Mondi Espirit size 1 sleeve and glove for day time management.  Manual lymph drainage in supine as follows: short neck, (no diaphragmatic breathing due to shortness of breath from medication) , left inguinal nodes, left axillo-inguinal anastamoses. Left upper extremity working proximal to distal then retracing all steps being very careful around area on forearm where pt recently had surgery. Compression bandaging: TG soft from hand to axilla, mollelast to digits 1-4 covered with paper tape, artiflex from hand to axilla, 1 6cm bandage at hand, 1 8 cm bandage from wrist to axilla in herringbone, 1  12 cm bandage from hand to axilla with spiral.  01/01/2022 Pt brought in old Juzo sleeve that is too big at the top. Class 1 circular knit Size IV. She does not have a handpiece. Also brought in her tribute night without the hand piece. Pt has tribute very loose so not really helping much, but she is not able to tighten the Velcro straps with it on. Sleeve is too short. Adjusted velcro straps to make slightly tighter. We may have an oversleeve in the back that would help her get it slightly snugger. Remeasured circumferences for PT note and compression sleeve/gauntlet. Manual lymph drainage in supine as follows: short neck, (no diaphragmatic breathing due to shortness of breath from medication) , left inguinal nodes, left axillo-inguinal anastamoses. Left upper extremity working proximal to distal then retracing all steps being very careful around area on forearm where pt recently had surgery. Compression bandaging: TG soft from hand to axilla, mollelast to digits 1-4 covered with paper tape, artiflex from hand to axilla, 1 6cm bandage at hand, 1 8 cm bandage from wrist to axilla in herringbone, 1 12 cm bandage from hand to axilla with spiral. Pt does not fit into a Juzo off the shelf . Searched garments after pt left. She does fit into Size 1 Medi [facility] Esprit sleeve/glove standard length, or a Medi Harmony size 2 glove, Exostrong sleeve small short May want to wait until January to consider a Circaid night Eval: Discussed POC and wrapping with pt. Pt is agreeable to try wrapping for 2 weeks to see if her arm would reduce. Swelling is soft and with compliance should reduce so she can be fit for a new sleeve. Discussed caution folding over TG soft too much because of increasing the pressure possibly increasing swelling at hand and at upper arm.Marland Kitchen PATIENT EDUCATION:  Education details: discussed POC, bandaging, new garments, need for 3x/week, instruct family member for weekends if she would like. Person  educated: Patient Education  method: Explanation Education comprehension: verbalized understanding  HOME EXERCISE PROGRAM: None given today  ASSESSMENT:  CLINICAL IMPRESSION: Pts new sleeve still has not arrived.  Good decrease in UE measurements today but wraps were removed just prior to measuring. Pt only has 1 appt next week. Reminded her to remove wraps if they get loose or slide and reminded that she has night garment she can use if needed.  OBJECTIVE IMPAIRMENTS: decreased activity tolerance, decreased knowledge of condition, decreased ROM, increased edema, impaired UE functional use, postural dysfunction, and pain.   ACTIVITY LIMITATIONS: lifting, dressing, and reach over head  PARTICIPATION LIMITATIONS: cleaning, yard work, and reaching activities  PERSONAL FACTORS: 3+ comorbidities: Breast Cancer s/p radiation and with metastasis, COPD, OA, current cancer meds  are also affecting patient's functional outcome.   REHAB POTENTIAL: Good  CLINICAL DECISION MAKING: Stable/uncomplicated  EVALUATION COMPLEXITY: Low  GOALS: Goals reviewed with patient? Yes  SHORT TERM GOALS: Target date: 02/19/2022    Pt will be tolerant of compression bandaging Baseline: Goal status: MET  12/18/2021 2.  Pt will have reduction at olecranon and 10 cm prox to ulna by 1.5 cm Baseline: Goal status: MET 12/18/2021  LONG TERM GOALS: Target date: 03/12/2022    Pt will have decreased edema at olecranon and 10 cm prox to ulna styloid by 2.5 cm Baseline: 24.0 Goal status: MET 21.3 cm on 01/05/22  2.  Pt will be fit for appropriate compression sleeve Baseline:  Goal status: IN PROGRESS sleeve ordered 01/05/22 and night time garment and pt is awaiting arrival  3.  Pt will be independent with self care of lymphedema including day and night compression Baseline:  Goal status: IN PROGRESS  4.  Pt will improve LLIS to no greater than 30% to demonstrate improved function  Baseline:  Goal status: IN  PROGRESS    PLAN:  PT FREQUENCY: 3x/week  PT DURATION: 4 weeks  PLANNED INTERVENTIONS: Therapeutic exercises, Patient/Family education, Self Care, Orthotic/Fit training, Manual lymph drainage, Compression bandaging, and Manual therapy  PLAN FOR NEXT SESSION: Continue until pt receives her new day sleeve, Measure next visit,Make goal prn, how were bandages?, continue CDT. Awaiting new daytime sleeve, has already received [place], PT 01/29/2022, 3:01 PM

## 2022-02-03 DIAGNOSIS — H353121 Nonexudative age-related macular degeneration, left eye, early dry stage: Secondary | ICD-10-CM | POA: Diagnosis not present

## 2022-02-03 DIAGNOSIS — H40013 Open angle with borderline findings, low risk, bilateral: Secondary | ICD-10-CM | POA: Diagnosis not present

## 2022-02-03 DIAGNOSIS — H353112 Nonexudative age-related macular degeneration, right eye, intermediate dry stage: Secondary | ICD-10-CM | POA: Diagnosis not present

## 2022-02-03 DIAGNOSIS — H35033 Hypertensive retinopathy, bilateral: Secondary | ICD-10-CM | POA: Diagnosis not present

## 2022-02-04 ENCOUNTER — Ambulatory Visit: Payer: Medicare Other | Attending: Dermatology

## 2022-02-04 DIAGNOSIS — I890 Lymphedema, not elsewhere classified: Secondary | ICD-10-CM | POA: Diagnosis not present

## 2022-02-04 DIAGNOSIS — C44619 Basal cell carcinoma of skin of left upper limb, including shoulder: Secondary | ICD-10-CM | POA: Insufficient documentation

## 2022-02-04 DIAGNOSIS — C44629 Squamous cell carcinoma of skin of left upper limb, including shoulder: Secondary | ICD-10-CM | POA: Diagnosis not present

## 2022-02-04 NOTE — Therapy (Signed)
OUTPATIENT PHYSICAL THERAPY ONCOLOGY TREATMENT  Patient Name: Stacey [name] MRN: [id number] DOB:[birth year], 82 y.o., female Today's Date: 02/04/2022     PT End of Session - 02/04/22 1457     Visit Number 17    Number of Visits 25    Date for PT Re-Evaluation 03/04/22    PT Start Time 1500    PT Stop Time 1556    PT Time Calculation (min) 56 min    Activity Tolerance Patient tolerated treatment well    Behavior During Therapy [facility] for tasks assessed/performed               Past Medical History:  Diagnosis Date   ANEMIA-NOS    ANXIETY    Breast cancer ([place]) 1997 L, 2012 R   s/p chemo/xrt   COPD    resolved   DIVERTICULOSIS, COLON 2008   Dizziness    Family history of breast cancer    Family history of lung cancer    Family history of lymphoma    Family history of pancreatic cancer    Family history of uterine cancer    GERD    Hx of radiation therapy 10/19/11 -12/03/11   right breast   HYPERLIPIDEMIA    IRRITABLE BOWEL SYNDROME, HX OF    Left-sided carotid artery disease (HCC)    moderate left ICA stenosis   OSTEOARTHRITIS, HAND    PSVT (paroxysmal supraventricular tachycardia)    symptomatic on event monitor   Past Surgical History:  Procedure Laterality Date   ABDOMINAL HYSTERECTOMY     APPENDECTOMY     BREAST BIOPSY  11/14/10    r breast: inv, insitu mammary carcinoma w/calcif, er/pr +, her2 -   BREAST SURGERY     lumpectomy   CATARACT EXTRACTION     both eyes   ELECTROPHYSIOLOGIC STUDY N/A 05/10/2015   Procedure: SVT Ablation;  Surgeon: Will Meredith Leeds, MD;  Location: [facility];  Service: Cardiovascular;  Laterality: N/A;   HERNIA REPAIR     inguinal herniorrhapy  1984   left   IR US GUIDE BX ASP/DRAIN  05/26/2019   rectal fissure repair     s/p benign breast biopsy  2003   right   s/p left foot surgury  2009   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   spiral fx left foot  2008   no surgury   TMJ ARTHROPLASTY  1989    TONGUE SURGERY     1988- to remove scar tissue growth    TONSILLECTOMY     Patient Active Problem List   Diagnosis Date Noted   Acute upper respiratory infection 05/16/2021   Nasal dryness 02/04/2021   Chronic sinusitis 11/25/2020   Allergic rhinitis 07/08/2020   Hypomagnesemia 06/10/2020   Goals of care, counseling/discussion 06/21/2019   Family history of breast cancer    Family history of pancreatic cancer    Family history of uterine cancer    Family history of lymphoma    Family history of lung cancer    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[place]) 06/02/2019   Recurrent cancer of left breast ([place]) 05/15/2019   Pes anserine bursitis 03/31/2019   HTN (hypertension) 02/10/2019   Hyperglycemia 06/21/2018   Left carpal tunnel syndrome 02/22/2018   Rotator cuff arthropathy of left shoulder 09/20/2017   Arthritis of hand 08/23/2017   Pain in right hand 08/23/2017   Cervical radiculitis 04/09/2017  Left shoulder pain 04/09/2017   Dyspnea on exertion 05/14/2016   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   Lymphedema of left arm 01/14/2016   Left arm swelling 12/25/2015   SVT (supraventricular tachycardia)    Left-sided carotid artery disease ([place]) 03/08/2015   Dizziness 01/24/2015   Urinary frequency 01/13/2015   Dizziness and giddiness 01/09/2015   Gallstones 02/21/2013   Malignant neoplasm of upper-inner quadrant of left breast in female, estrogen receptor positive ([place]) 11/28/2012   Muscle cramping 09/12/2012   Right hip pain 09/12/2012   Lower back pain 09/12/2012   COPD GOLD I     Hx of radiation therapy    Right shoulder pain 09/14/2011   Preventative health care 07/29/2010   Skin lesion of left leg 07/29/2010   Paresthesia 07/29/2010   GERD 03/28/2010   CONSTIPATION 03/28/2010   Osteoarthrosis, 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   Anxiety state 05/03/2009   Hyperlipidemia 06/14/2007   FATIGUE 06/14/2007   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  Rationale for Evaluation and Treatment Rehabilitation  SUBJECTIVE:                                                                                                                                                                                          SUBJECTIVE STATEMENT:  I kept the wrap on until 1:30 today.. I still have not received the sleeve. I was hoping it would come this week.    PERTINENT HISTORY:  Hx breast cancer with 15 lymph node removal on the L and Bil lumpectomy. Lumpectomy on the L in 1997and the R in 2012. Radiation Bil.  New discovery of metastatic breast Cancer presently on Palbocidib and Anastrozole,  Fulvestrant since 06/2019.Recent PET scan on 10/09/2021 showed no evidence of recurrent or metastatic disease. Prior lymphedema treatment for Left UE in 2021  PAIN:  Are you having pain? No pain today NPRS scale: pt did not rate on scale Pain location: wrist and hand on left  Pain orientation: Left  PAIN TYPE: stabbing Pain description: intermittent  Aggravating factors: evening Relieving factors: Tribute wrap but doesn't wear the hand piece.  PRECAUTIONS: Other: Left UE lymphedema , Metastatic breast Cancer, COPD  WEIGHT BEARING RESTRICTIONS: No  FALLS:  Has patient fallen  in last 6 months? No  LIVING ENVIRONMENT: Lives with: lives alone Lives in: House/apartment Stairs: No; External: 1 steps; on right going up Has following equipment at home: None  OCCUPATION: retired  LEISURE: reading, puzzles, walks very little  HAND DOMINANCE: right   PRIOR LEVEL OF FUNCTION:  Independent  PATIENT GOALS: Try and getting swelling in left arm down. Get a new compression sleeve that fits properly   OBJECTIVE:  COGNITION: Overall cognitive status: Within functional limits for tasks assessed   PALPATION: Mild pitting edema ulnar border of Left UE  OBSERVATIONS / OTHER ASSESSMENTS: Bandaid on forearm with small amount of clear  drainage showing through. Other aspect of incision that can be viewed is healed. Sensation to light touch in the hand is intact. Dorsum of hand is slightly swollen, and upper arm swollen above where she had TG soft folded over and with bottom folded over several inches, may be making hand swell. She complains of left wrist pain worse at night and has a weak left grip  SENSATION: Light touch: Appears intact throughout left UE   POSTURE: forward head, rounded shoulders  UPPER EXTREMITY AROM/PROM:  A/PROM RIGHT   eval   Shoulder extension   Shoulder flexion   Shoulder abduction   Shoulder internal rotation   Shoulder external rotation     (Blank rows = not tested)  A/PROM LEFT   eval  Shoulder extension   Shoulder flexion   Shoulder abduction   Shoulder internal rotation   Shoulder external rotation     (Blank rows = not tested)  CERVICAL AROM: All within functional limits:   UPPER EXTREMITY STRENGTH: NT  LYMPHEDEMA ASSESSMENTS:   SURGERY TYPE/DATE: Left breast Lumpectomy 1997, Right lumpectomy 2012  NUMBER OF LYMPH NODES REMOVED: 15 on left UE  CHEMOTHERAPY:Yes, presently on medications for metastasis  RADIATION:bilateral   HORMONE TREATMENT: yes  INFECTIONS: yes  LYMPHEDEMA ASSESSMENTS:   LANDMARK RIGHT  eval  10 cm proximal to olecranon process 27.5  Olecranon process 22.4  10 cm proximal to ulnar styloid process 19.0  Just proximal to ulnar styloid process 15.2  Across hand at thumb web space 17.9  At base of 2nd digit 6.3  (Blank rows = not tested)  LANDMARK LEFT  eval LEFT 12/18/2021  LEFT 12/24/2021 LEFT 12/29/21 LEFT 01/01/2022 LEFT 01/20/2022 LEFT 01/29/2022 LEFT 02/04/2022  10 cm proximal to olecranon process 28.2 29.0 28.6 28.5 28 28.3 28.0 29.0  Olecranon process 27.5 25.3 25.6 25.5 25.6 25.5 24.5 25.0  10 cm proximal to ulnar styloid process 24.0 21 21.6 21.8 21.3 21.3 20.6 21.0  Just proximal to ulnar styloid process 18.2 17.7 18.2 17.5 17.7 17.8 16.7 17.0  Across hand at thumb web space 18.5 18.0 18.3 17.5 17.7 17.6 17.7 17.8  At base of 2nd digit 6.5 6.3 6.3 6.4 6.3 6.35 6.2 6.2  (Blank rows = not tested)  Wrist   16.4     Elbow  26           axilla  29.2,     MCPS17.3    wrist 16.8  (SLEEVE MEASUREMENTS)    LLIS : 47%   TODAY'S TREATMENT:  DATE  02/04/2021 Arm remeasured. Slight irritation proximal to elbow where wrap was on for 6 days Manual lymph drainage in supine as follows: short neck, (no diaphragmatic breathing due to shortness of breath from medication) , left inguinal nodes, left axillo-inguinal anastamoses. Left upper extremity working proximal to distal then retracing all steps being very careful around area on forearm where pt recently had surgery. Compression bandaging: TG soft from hand to axilla, mollelast to digits 1-4 , artiflex from hand to axilla, 1 6cm bandage at hand, 1 8 cm bandage from wrist to axilla in herringbone, 1 12 cm bandage from hand to axilla with spiral.(Gave pt new wraps are hers are very stretched out.   01/27/2022 Manual lymph drainage in supine as follows: short neck, (no diaphragmatic breathing due to shortness of breath from medication) , left inguinal nodes, left axillo-inguinal anastamoses. Left upper extremity working proximal to distal then retracing all steps being very careful around area on forearm where pt recently had surgery. Compression bandaging: TG soft from hand to axilla, mollelast to  digits 1-4 , artiflex from hand to axilla, 1 6cm bandage at hand, 1 8 cm bandage from wrist to axilla in herringbone, 1 12 cm bandage from hand to axilla with spiral.   01/29/2022 Wraps removed and pt remeasured Manual lymph drainage in supine as follows: short neck, (no diaphragmatic breathing due to shortness of breath from medication) , left inguinal nodes, left axillo-inguinal anastamoses. Left upper extremity working proximal to distal then retracing all steps being very careful around area on forearm where pt recently had surgery. Compression bandaging: TG soft from hand to axilla, mollelast to digits 1-4 , artiflex from hand to axilla, 1 6cm bandage at hand, 1 8 cm bandage from wrist to axilla in herringbone, 1 12 cm bandage from hand to axilla with spiral.   01/27/2022 Manual lymph drainage in supine as follows: short neck, (no diaphragmatic breathing due to shortness of breath from medication) , left inguinal nodes, left axillo-inguinal anastamoses. Left upper extremity working proximal to distal then retracing all steps being very careful around area on forearm where pt recently had surgery. Compression bandaging: TG soft from hand to axilla, mollelast to digits 1-4 , artiflex from hand to axilla, 1 6cm bandage at hand, 1 8 cm bandage from wrist to axilla in herringbone, 1 12 cm bandage from hand to axilla with spiral.    01/20/2022 Manual lymph drainage in supine as follows: short neck, (no diaphragmatic breathing due to shortness of breath from medication) , left inguinal nodes, left axillo-inguinal anastamoses. Left upper extremity working proximal to distal then retracing all steps being very careful around area on forearm where pt recently had surgery. Pt remeasured with measurements remaining fairly stable Compression bandaging: TG soft from hand to axilla, mollelast to digits 1-4 covered with paper tape, artiflex from hand to axilla, 1 6cm bandage at hand, 1 8 cm bandage from wrist  to axilla in herringbone, 1 12 cm bandage from hand to axilla with spiral.   01/15/2022 Manual lymph drainage in supine as follows: short neck, (no diaphragmatic breathing due to shortness of breath from medication) , left inguinal nodes, left axillo-inguinal anastamoses. Left upper extremity working proximal to distal then retracing all steps being very careful around area on forearm where pt recently had surgery. Compression bandaging: TG soft from hand to axilla, mollelast to digits 1-4 covered with paper tape, artiflex from hand to axilla, 1 6cm bandage at hand, 1 8 cm bandage from wrist  to axilla in herringbone, 1 12 cm bandage from hand to axilla with spiral.    01/12/2022 Discussed compression sleeves and option for custom flat knit vs. Circular knit like Bella Strong. Pt has opted for custom flat knit and will be measured by Primitivo Gauze on Wed. Manual lymph drainage in supine as follows: short neck, (no diaphragmatic breathing due to shortness of breath from medication) , left inguinal nodes, left axillo-inguinal anastamoses. Left upper extremity working proximal to distal then retracing all steps being very careful around area on forearm where pt recently had surgery. Compression bandaging: TG soft from hand to axilla, mollelast to digits 1-4 covered with paper tape, artiflex from hand to axilla, 1 6cm bandage at hand, 1 8 cm bandage from wrist to axilla in herringbone, 1 12 cm bandage from hand to axilla with spiral.   01/08/2022 Pt brought all of her garments with her. Showed pt how to don Circaid Night garment using tabs on sleeve. She had some difficulty but when taking her time was able to get it on properly. She could not get power sleeve on and did not like it. Tried donning class 2 Medi Esprit day sleeve but pt was unable to get it over her wrist. Also tried EZ Slide and it was better, but pt was still not able to do independently and did not think she would be able to due to weakness in  her right hand. Due to time running out told pt I will review other options for sleeve and will discuss with her next visit. She will return the [employer] sleeves. Compression bandaging: TG soft from hand to axilla, mollelast to digits 1-4 covered with paper tape, artiflex from hand to axilla, 1 6cm bandage at hand, 1 8 cm bandage from wrist to axilla in herringbone, 1 12 cm bandage from hand to axilla with spiral.  01/05/2022 Measured pt for CircAid Profile night time garment and she fit in to a size 2. Educated pt that it is important she has a well fitting nighttime garment to keep fluid from collecting in her arm overnight. Her current garment does not fit well and is too short. Assisted pt with ordering a Medi [employer] Espirit size 1 sleeve and glove for day time management.  Manual lymph drainage in supine as follows: short neck, (no diaphragmatic breathing due to shortness of breath from medication) , left inguinal nodes, left axillo-inguinal anastamoses. Left upper extremity working proximal to distal then retracing all steps being very careful around area on forearm where pt recently had surgery. Compression bandaging: TG soft from hand to axilla, mollelast to digits 1-4 covered with paper tape, artiflex from hand to axilla, 1 6cm bandage at hand, 1 8 cm bandage from wrist to axilla in herringbone, 1 12 cm bandage from hand to axilla with spiral.  01/01/2022 Pt brought in old Juzo sleeve that is too big at the top. Class 1 circular knit Size IV. She does not have a handpiece. Also brought in her tribute night without the hand piece. Pt has tribute very loose so not really helping much, but she is not able to tighten the Velcro straps with it on. Sleeve is too short. Adjusted velcro straps to make slightly tighter. We may have an oversleeve in the back that would help her get it slightly snugger. Remeasured circumferences for PT note and compression sleeve/gauntlet. Manual lymph drainage in supine  as follows: short neck, (no diaphragmatic breathing due to shortness of breath from medication) ,  left inguinal nodes, left axillo-inguinal anastamoses. Left upper extremity working proximal to distal then retracing all steps being very careful around area on forearm where pt recently had surgery. Compression bandaging: TG soft from hand to axilla, mollelast to digits 1-4 covered with paper tape, artiflex from hand to axilla, 1 6cm bandage at hand, 1 8 cm bandage from wrist to axilla in herringbone, 1 12 cm bandage from hand to axilla with spiral. Pt does not fit into a Juzo off the shelf . Searched garments after pt left. She does fit into Size 1 Medi [facility] Esprit sleeve/glove standard length, or a Medi Harmony size 2 glove, Exostrong sleeve small short May want to wait until January to consider a Circaid night Eval: Discussed POC and wrapping with pt. Pt is agreeable to try wrapping for 2 weeks to see if her arm would reduce. Swelling is soft and with compliance should reduce so she can be fit for a new sleeve. Discussed caution folding over TG soft too much because of increasing the pressure possibly increasing swelling at hand and at upper arm.Marland Kitchen PATIENT EDUCATION:  Education details: discussed POC, bandaging, new garments, need for 3x/week, instruct family member for weekends if she would like. Person educated: Patient Education method: Explanation Education comprehension: verbalized understanding  HOME EXERCISE PROGRAM: None given today  ASSESSMENT:  CLINICAL IMPRESSION:Pts left arm with slight increase today but wrap had been on for 6 days because she was unable to come in 2 days this week. Mild irritation also noted above elbow. Pt is still awaiting her day garment and is anxious to receive it so she may be released from PT.  OBJECTIVE IMPAIRMENTS: decreased activity tolerance, decreased knowledge of condition, decreased ROM, increased edema, impaired UE functional use, postural dysfunction,  and pain.   ACTIVITY LIMITATIONS: lifting, dressing, and reach over head  PARTICIPATION LIMITATIONS: cleaning, yard work, and reaching activities  PERSONAL FACTORS: 3+ comorbidities: Breast Cancer s/p radiation and with metastasis, COPD, OA, current cancer meds  are also affecting patient's functional outcome.   REHAB POTENTIAL: Good  CLINICAL DECISION MAKING: Stable/uncomplicated  EVALUATION COMPLEXITY: Low  GOALS: Goals reviewed with patient? Yes  SHORT TERM GOALS: Target date: 02/25/2022    Pt will be tolerant of compression bandaging Baseline: Goal status: MET  12/18/2021 2.  Pt will have reduction at olecranon and 10 cm prox to ulna by 1.5 cm Baseline: Goal status: MET 12/18/2021  LONG TERM GOALS: Target date: 03/18/2022    Pt will have decreased edema at olecranon and 10 cm prox to ulna styloid by 2.5 cm Baseline: 24.0 Goal status: MET 21.3 cm on 01/05/22  2.  Pt will be fit for appropriate compression sleeve Baseline:  Goal status: IN PROGRESS sleeve ordered 01/05/22 and night time garment and pt is awaiting arrival  3.  Pt will be independent with self care of lymphedema including day and night compression Baseline:  Goal status: IN PROGRESS  4.  Pt will improve LLIS to no greater than 30% to demonstrate improved function  Baseline:  Goal status: IN PROGRESS    PLAN:  PT FREQUENCY: 2x/week  PT DURATION: 4 weeks  PLANNED INTERVENTIONS: Therapeutic exercises, Patient/Family education, Self Care, Orthotic/Fit training, Manual lymph drainage, Compression bandaging, and Manual therapy  PLAN FOR NEXT SESSION: Continue until pt receives her new day sleeve, Measure next visit,, how were bandages?, continue CDT. Awaiting new daytime sleeve, has already received Circaid Night  Claris Pong, PT 02/04/2022, 5:20 PM

## 2022-02-09 ENCOUNTER — Ambulatory Visit: Payer: Medicare Other

## 2022-02-09 DIAGNOSIS — C44629 Squamous cell carcinoma of skin of left upper limb, including shoulder: Secondary | ICD-10-CM | POA: Diagnosis not present

## 2022-02-09 DIAGNOSIS — C44619 Basal cell carcinoma of skin of left upper limb, including shoulder: Secondary | ICD-10-CM | POA: Diagnosis not present

## 2022-02-09 DIAGNOSIS — I890 Lymphedema, not elsewhere classified: Secondary | ICD-10-CM | POA: Diagnosis not present

## 2022-02-09 NOTE — Therapy (Signed)
OUTPATIENT PHYSICAL THERAPY ONCOLOGY TREATMENT  Patient Name: Stacey [name] MRN: [id number] DOB:[birth year], 82 y.o., female Today's Date: 02/09/2022     PT End of Session - 02/09/22 1358     Visit Number 18    Number of Visits 25    Date for PT Re-Evaluation 03/04/22    PT Start Time 1400    PT Stop Time 2426    PT Time Calculation (min) 65 min    Activity Tolerance Patient tolerated treatment well    Behavior During Therapy [facility] for tasks assessed/performed               Past Medical History:  Diagnosis Date   ANEMIA-NOS    ANXIETY    Breast cancer ([place]) 1997 L, 2012 R   s/p chemo/xrt   COPD    resolved   DIVERTICULOSIS, COLON 2008   Dizziness    Family history of breast cancer    Family history of lung cancer    Family history of lymphoma    Family history of pancreatic cancer    Family history of uterine cancer    GERD    Hx of radiation therapy 10/19/11 -12/03/11   right breast   HYPERLIPIDEMIA    IRRITABLE BOWEL SYNDROME, HX OF    Left-sided carotid artery disease (HCC)    moderate left ICA stenosis   OSTEOARTHRITIS, HAND    PSVT (paroxysmal supraventricular tachycardia)    symptomatic on event monitor   Past Surgical History:  Procedure Laterality Date   ABDOMINAL HYSTERECTOMY     APPENDECTOMY     BREAST BIOPSY  11/14/10    r breast: inv, insitu mammary carcinoma w/calcif, er/pr +, her2 -   BREAST SURGERY     lumpectomy   CATARACT EXTRACTION     both eyes   ELECTROPHYSIOLOGIC STUDY N/A 05/10/2015   Procedure: SVT Ablation;  Surgeon: Will Meredith Leeds, MD;  Location: [facility];  Service: Cardiovascular;  Laterality: N/A;   HERNIA REPAIR     inguinal herniorrhapy  1984   left   IR US GUIDE BX ASP/DRAIN  05/26/2019   rectal fissure repair     s/p benign breast biopsy  2003   right   s/p left foot surgury  2009   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   spiral fx left foot  2008   no surgury   TMJ ARTHROPLASTY  1989    TONGUE SURGERY     1988- to remove scar tissue growth    TONSILLECTOMY     Patient Active Problem List   Diagnosis Date Noted   Acute upper respiratory infection 05/16/2021   Nasal dryness 02/04/2021   Chronic sinusitis 11/25/2020   Allergic rhinitis 07/08/2020   Hypomagnesemia 06/10/2020   Goals of care, counseling/discussion 06/21/2019   Family history of breast cancer    Family history of pancreatic cancer    Family history of uterine cancer    Family history of lymphoma    Family history of lung cancer    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[place]) 06/02/2019   Recurrent cancer of left breast ([place]) 05/15/2019   Pes anserine bursitis 03/31/2019   HTN (hypertension) 02/10/2019   Hyperglycemia 06/21/2018   Left carpal tunnel syndrome 02/22/2018   Rotator cuff arthropathy of left shoulder 09/20/2017   Arthritis of hand 08/23/2017   Pain in right hand 08/23/2017   Cervical radiculitis 04/09/2017  Left shoulder pain 04/09/2017   Dyspnea on exertion 05/14/2016   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   Lymphedema of left arm 01/14/2016   Left arm swelling 12/25/2015   SVT (supraventricular tachycardia)    Left-sided carotid artery disease ([place]) 03/08/2015   Dizziness 01/24/2015   Urinary frequency 01/13/2015   Dizziness and giddiness 01/09/2015   Gallstones 02/21/2013   Malignant neoplasm of upper-inner quadrant of left breast in female, estrogen receptor positive ([place]) 11/28/2012   Muscle cramping 09/12/2012   Right hip pain 09/12/2012   Lower back pain 09/12/2012   COPD GOLD I     Hx of radiation therapy    Right shoulder pain 09/14/2011   Preventative health care 07/29/2010   Skin lesion of left leg 07/29/2010   Paresthesia 07/29/2010   GERD 03/28/2010   CONSTIPATION 03/28/2010   Osteoarthrosis, 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   Anxiety state 05/03/2009   Hyperlipidemia 06/14/2007   FATIGUE 06/14/2007   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  Rationale for Evaluation and Treatment Rehabilitation  SUBJECTIVE:                                                                                                                                                                                          SUBJECTIVE STATEMENT:  I got my sleeve over the weekend but I didn't try it on. The wrap has been on since Wednesday and all the fingers stayed intact. I hope I will be done with the wrapping after today  PERTINENT HISTORY:  Hx breast cancer with 15 lymph node removal on the L and Bil lumpectomy. Lumpectomy on the L in 1997and the R in 2012. Radiation Bil.  New discovery of metastatic breast Cancer presently on Palbocidib and Anastrozole,  Fulvestrant since 06/2019.Recent PET scan on 10/09/2021 showed no evidence of recurrent or metastatic disease. Prior lymphedema treatment for Left UE in 2021  PAIN:  Are you having pain? No pain today NPRS scale: pt did not rate on scale Pain location: wrist and hand on left  Pain orientation: Left  PAIN TYPE: stabbing Pain description: intermittent  Aggravating factors: evening Relieving factors: Tribute wrap but doesn't wear the hand piece.  PRECAUTIONS: Other: Left UE lymphedema , Metastatic breast Cancer,  COPD  WEIGHT BEARING RESTRICTIONS: No  FALLS:  Has patient fallen in last 6 months? No  LIVING ENVIRONMENT: Lives with: lives alone Lives in: House/apartment Stairs: No; External: 1 steps; on right going up Has following equipment at home: None  OCCUPATION: retired  LEISURE: reading, puzzles, walks very  little  HAND DOMINANCE: right   PRIOR LEVEL OF FUNCTION: Independent  PATIENT GOALS: Try and getting swelling in left arm down. Get a new compression sleeve that fits properly   OBJECTIVE:  COGNITION: Overall cognitive status: Within functional limits for tasks assessed   PALPATION: Mild pitting edema ulnar border of Left UE  OBSERVATIONS / OTHER ASSESSMENTS: Bandaid on forearm with small amount of clear  drainage showing through. Other aspect of incision that can be viewed is healed. Sensation to light touch in the hand is intact. Dorsum of hand is slightly swollen, and upper arm swollen above where she had TG soft folded over and with bottom folded over several inches, may be making hand swell. She complains of left wrist pain worse at night and has a weak left grip  SENSATION: Light touch: Appears intact throughout left UE   POSTURE: forward head, rounded shoulders  UPPER EXTREMITY AROM/PROM:  A/PROM RIGHT   eval   Shoulder extension   Shoulder flexion   Shoulder abduction   Shoulder internal rotation   Shoulder external rotation     (Blank rows = not tested)  A/PROM LEFT   eval  Shoulder extension   Shoulder flexion   Shoulder abduction   Shoulder internal rotation   Shoulder external rotation     (Blank rows = not tested)  CERVICAL AROM: All within functional limits:   UPPER EXTREMITY STRENGTH: NT  LYMPHEDEMA ASSESSMENTS:   SURGERY TYPE/DATE: Left breast Lumpectomy 1997, Right lumpectomy 2012  NUMBER OF LYMPH NODES REMOVED: 15 on left UE  CHEMOTHERAPY:Yes, presently on medications for metastasis  RADIATION:bilateral   HORMONE TREATMENT: yes  INFECTIONS: yes  LYMPHEDEMA ASSESSMENTS:   LANDMARK RIGHT  eval  10 cm proximal to olecranon process 27.5  Olecranon process 22.4  10 cm proximal to ulnar styloid process 19.0  Just proximal to ulnar styloid process 15.2  Across hand at thumb web space 17.9  At base of 2nd digit 6.3  (Blank rows = not  tested)  LANDMARK LEFT  eval LEFT 12/18/2021 LEFT 12/24/2021 LEFT 12/29/21 LEFT 01/01/2022 LEFT 01/20/2022 LEFT 01/29/2022 LEFT 02/04/2022 LEFT 02/09/2022  10 cm proximal to olecranon process 28.2 29.0 28.6 28.5 28 28.3 28.0 29.0 29.0  Olecranon process 27.5 25.3 25.6 25.5 25.6 25.5 24.5 25.0 25  10 cm proximal to ulnar styloid process 24.0 21 21.6 21.8 21.3 21.3 20.6 21.0 20.5  Just proximal to ulnar styloid process 18.2 17.7 18.2 17.5 17.7 17.8 16.7 17.0 17.3  Across hand at thumb web space 18.5 18.0 18.3 17.5 17.7 17.6 17.7 17.8 17.7  At base of 2nd digit 6.5 6.3 6.3 6.4 6.3 6.35 6.2 6.2 6.2  (Blank rows = not tested)  Wrist   16.4     Elbow  26           axilla  29.2,     MCPS17.3    wrist 16.8  (SLEEVE MEASUREMENTS)    LLIS : 47%   TODAY'S TREATMENT:  DATE  02/09/2021 All wraps removed and pt remeasured. Slight irritation again noted above elbow but no skin break down Practiced donning sleeve several different ways to see how pt did the best. Tried Juzo foot slip eze over hand with pt pulling sleeve over it. Pt did fairly well initially but got the sleeve bunched up and was not able to get it any higher without help. She did well with the rubber gloves and mild assist but was not quite getting high enough without help. Removed sleeve and had pt try the arm butler. Therapist placed sleeve over butler and pt felt she would be able to do the same.  She did much better getting her arm into the sleeve and was only lacking getting it about an inch from the top. She was able to move it up with the rubber glove. When on correctly sleeve seemed to fit well. Pt had only received 2 Medi harmony gloves and we are not sure where they came from as we ordered an espirit glove. Demetrius Charity was too big and was not flat knit so removed pt sleeve and therapist wrapped pts arm: applied cocoa  butter then Compression bandaging: TG soft from hand to axilla, mollelast to digits 1-4 , artiflex from hand to axilla, 1 6cm bandage at hand, 1 8 cm bandage from wrist to axilla in herringbone, 1 12 cm bandage from hand to axilla with spiral. Manus Gunning PT also looked at pts sleeve and invoice which showed that a medi espirit glove was ordered. Pt will check to see if she can find another glove at home. Not sure how she received 2 harmony gloves    02/04/2021 Arm remeasured. Slight irritation proximal to elbow where wrap was on for 6 days Manual lymph drainage in supine as follows: short neck, (no diaphragmatic breathing due to shortness of breath from medication) , left inguinal nodes, left axillo-inguinal anastamoses. Left upper extremity working proximal to distal then retracing all steps being very careful around area on forearm where pt recently had surgery. Compression bandaging: TG soft from hand to axilla, mollelast to digits 1-4 , artiflex from hand to axilla, 1 6cm bandage at hand, 1 8 cm bandage from wrist to axilla in herringbone, 1 12 cm bandage from hand to axilla with spiral.(Gave pt new wraps are hers are very stretched out.   01/27/2022 Manual lymph drainage in supine as follows: short neck, (no diaphragmatic breathing due to shortness of breath from medication) , left inguinal nodes, left axillo-inguinal anastamoses. Left upper extremity working proximal to distal then retracing all steps being very careful around area on forearm where pt recently had surgery. Compression bandaging: TG soft from hand to axilla, mollelast to digits 1-4 , artiflex from hand to axilla, 1 6cm bandage at hand, 1 8 cm bandage from wrist to axilla in herringbone, 1 12 cm bandage from hand to axilla with spiral.   01/29/2022 Wraps removed and pt remeasured Manual lymph drainage in supine as follows: short neck, (no diaphragmatic breathing due to shortness of breath from medication) , left  inguinal nodes, left axillo-inguinal anastamoses. Left upper extremity working proximal to distal then retracing all steps being very careful around area on forearm where pt recently had surgery. Compression bandaging: TG soft from hand to axilla, mollelast to digits 1-4 , artiflex from hand to axilla, 1 6cm bandage at hand, 1 8 cm bandage from wrist to axilla in herringbone, 1 12 cm bandage from hand to axilla with spiral.  01/27/2022 Manual lymph drainage in supine as follows: short neck, (no diaphragmatic breathing due to shortness of breath from medication) , left inguinal nodes, left axillo-inguinal anastamoses. Left upper extremity working proximal to distal then retracing all steps being very careful around area on forearm where pt recently had surgery. Compression bandaging: TG soft from hand to axilla, mollelast to digits 1-4 , artiflex from hand to axilla, 1 6cm bandage at hand, 1 8 cm bandage from wrist to axilla in herringbone, 1 12 cm bandage from hand to axilla with spiral.    01/20/2022 Manual lymph drainage in supine as follows: short neck, (no diaphragmatic breathing due to shortness of breath from medication) , left inguinal nodes, left axillo-inguinal anastamoses. Left upper extremity working proximal to distal then retracing all steps being very careful around area on forearm where pt recently had surgery. Pt remeasured with measurements remaining fairly stable Compression bandaging: TG soft from hand to axilla, mollelast to digits 1-4 covered with paper tape, artiflex from hand to axilla, 1 6cm bandage at hand, 1 8 cm bandage from wrist to axilla in herringbone, 1 12 cm bandage from hand to axilla with spiral.   01/15/2022 Manual lymph drainage in supine as follows: short neck, (no diaphragmatic breathing due to shortness of breath from medication) , left inguinal nodes, left axillo-inguinal anastamoses. Left upper extremity working proximal to distal then retracing all steps  being very careful around area on forearm where pt recently had surgery. Compression bandaging: TG soft from hand to axilla, mollelast to digits 1-4 covered with paper tape, artiflex from hand to axilla, 1 6cm bandage at hand, 1 8 cm bandage from wrist to axilla in herringbone, 1 12 cm bandage from hand to axilla with spiral.    01/12/2022 Discussed compression sleeves and option for custom flat knit vs. Circular knit like Bella Strong. Pt has opted for custom flat knit and will be measured by Primitivo Gauze on Wed. Manual lymph drainage in supine as follows: short neck, (no diaphragmatic breathing due to shortness of breath from medication) , left inguinal nodes, left axillo-inguinal anastamoses. Left upper extremity working proximal to distal then retracing all steps being very careful around area on forearm where pt recently had surgery. Compression bandaging: TG soft from hand to axilla, mollelast to digits 1-4 covered with paper tape, artiflex from hand to axilla, 1 6cm bandage at hand, 1 8 cm bandage from wrist to axilla in herringbone, 1 12 cm bandage from hand to axilla with spiral.   01/08/2022 Pt brought all of her garments with her. Showed pt how to don Circaid Night garment using tabs on sleeve. She had some difficulty but when taking her time was able to get it on properly. She could not get power sleeve on and did not like it. Tried donning class 2 Medi Esprit day sleeve but pt was unable to get it over her wrist. Also tried EZ Slide and it was better, but pt was still not able to do independently and did not think she would be able to due to weakness in her right hand. Due to time running out told pt I will review other options for sleeve and will discuss with her next visit. She will return the [employer] sleeves. Compression bandaging: TG soft from hand to axilla, mollelast to digits 1-4 covered with paper tape, artiflex from hand to axilla, 1 6cm bandage at hand, 1 8 cm bandage from wrist to  axilla in herringbone, 1 12 cm bandage from hand  to axilla with spiral.  01/05/2022 Measured pt for CircAid Profile night time garment and she fit in to a size 2. Educated pt that it is important she has a well fitting nighttime garment to keep fluid from collecting in her arm overnight. Her current garment does not fit well and is too short. Assisted pt with ordering a Medi Mondi Espirit size 1 sleeve and glove for day time management.  Manual lymph drainage in supine as follows: short neck, (no diaphragmatic breathing due to shortness of breath from medication) , left inguinal nodes, left axillo-inguinal anastamoses. Left upper extremity working proximal to distal then retracing all steps being very careful around area on forearm where pt recently had surgery. Compression bandaging: TG soft from hand to axilla, mollelast to digits 1-4 covered with paper tape, artiflex from hand to axilla, 1 6cm bandage at hand, 1 8 cm bandage from wrist to axilla in herringbone, 1 12 cm bandage from hand to axilla with spiral.  01/01/2022 Pt brought in old Juzo sleeve that is too big at the top. Class 1 circular knit Size IV. She does not have a handpiece. Also brought in her tribute night without the hand piece. Pt has tribute very loose so not really helping much, but she is not able to tighten the Velcro straps with it on. Sleeve is too short. Adjusted velcro straps to make slightly tighter. We may have an oversleeve in the back that would help her get it slightly snugger. Remeasured circumferences for PT note and compression sleeve/gauntlet. Manual lymph drainage in supine as follows: short neck, (no diaphragmatic breathing due to shortness of breath from medication) , left inguinal nodes, left axillo-inguinal anastamoses. Left upper extremity working proximal to distal then retracing all steps being very careful around area on forearm where pt recently had surgery. Compression bandaging: TG soft from hand to axilla,  mollelast to digits 1-4 covered with paper tape, artiflex from hand to axilla, 1 6cm bandage at hand, 1 8 cm bandage from wrist to axilla in herringbone, 1 12 cm bandage from hand to axilla with spiral. Pt does not fit into a Juzo off the shelf . Searched garments after pt left. She does fit into Size 1 Medi [facility] Esprit sleeve/glove standard length, or a Medi Harmony size 2 glove, Exostrong sleeve small short May want to wait until January to consider a Circaid night Eval: Discussed POC and wrapping with pt. Pt is agreeable to try wrapping for 2 weeks to see if her arm would reduce. Swelling is soft and with compliance should reduce so she can be fit for a new sleeve. Discussed caution folding over TG soft too much because of increasing the pressure possibly increasing swelling at hand and at upper arm.Marland Kitchen PATIENT EDUCATION:  Education details: discussed POC, bandaging, new garments, need for 3x/week, instruct family member for weekends if she would like. Person educated: Patient Education method: Explanation Education comprehension: verbalized understanding  HOME EXERCISE PROGRAM: None given today  ASSESSMENT:  CLINICAL IMPRESSION: Pt had difficulty donning compression sleeve with juzo foot slip eze, but did quite well with Arm Butler. She was not able to wear sleeve home today because she does not have the proper glove and the circular knit glove that was sent was too big. Pt was frustrated and had to remove sleeve and be wrapped again.  We will set her up to see Primitivo Gauze on Wednesday.  OBJECTIVE IMPAIRMENTS: decreased activity tolerance, decreased knowledge of condition, decreased ROM, increased edema, impaired UE  functional use, postural dysfunction, and pain.   ACTIVITY LIMITATIONS: lifting, dressing, and reach over head  PARTICIPATION LIMITATIONS: cleaning, yard work, and reaching activities  PERSONAL FACTORS: 3+ comorbidities: Breast Cancer s/p radiation and with metastasis, COPD, OA,  current cancer meds  are also affecting patient's functional outcome.   REHAB POTENTIAL: Good  CLINICAL DECISION MAKING: Stable/uncomplicated  EVALUATION COMPLEXITY: Low  GOALS: Goals reviewed with patient? Yes  SHORT TERM GOALS: Target date: 03/02/2022    Pt will be tolerant of compression bandaging Baseline: Goal status: MET  12/18/2021 2.  Pt will have reduction at olecranon and 10 cm prox to ulna by 1.5 cm Baseline: Goal status: MET 12/18/2021  LONG TERM GOALS: Target date: 03/23/2022    Pt will have decreased edema at olecranon and 10 cm prox to ulna styloid by 2.5 cm Baseline: 24.0 Goal status: MET 21.3 cm on 01/05/22  2.  Pt will be fit for appropriate compression sleeve Baseline:  Goal status: IN PROGRESS sleeve ordered 01/05/22 and night time garment and pt is awaiting arrival  3.  Pt will be independent with self care of lymphedema including day and night compression Baseline:  Goal status: IN PROGRESS  4.  Pt will improve LLIS to no greater than 30% to demonstrate improved function  Baseline:  Goal status: IN PROGRESS    PLAN:  PT FREQUENCY: 2x/week  PT DURATION: 4 weeks  PLANNED INTERVENTIONS: Therapeutic exercises, Patient/Family education, Self Care, Orthotic/Fit training, Manual lymph drainage, Compression bandaging, and Manual therapy  PLAN FOR NEXT SESSION: Continue until pt receives her new day sleeve, Measure next visit,, how were bandages?, continue CDT. Awaiting new daytime sleeve, has already received Circaid Night  Claris Pong, PT 02/09/2022, 8:21 PM

## 2022-02-11 ENCOUNTER — Ambulatory Visit: Payer: Medicare Other

## 2022-02-11 DIAGNOSIS — C44629 Squamous cell carcinoma of skin of left upper limb, including shoulder: Secondary | ICD-10-CM

## 2022-02-11 DIAGNOSIS — I890 Lymphedema, not elsewhere classified: Secondary | ICD-10-CM | POA: Diagnosis not present

## 2022-02-11 DIAGNOSIS — C44619 Basal cell carcinoma of skin of left upper limb, including shoulder: Secondary | ICD-10-CM | POA: Diagnosis not present

## 2022-02-11 NOTE — Therapy (Signed)
OUTPATIENT PHYSICAL THERAPY ONCOLOGY TREATMENT  Patient Name: Stacey [name] MRN: [id number] DOB:[birth year], 82 y.o., female Today's Date: 02/11/2022     PT End of Session - 02/11/22 1500     Visit Number 19    Number of Visits 25    Date for PT Re-Evaluation 03/04/22    PT Start Time 1502    PT Stop Time 4098    PT Time Calculation (min) 56 min    Activity Tolerance Patient tolerated treatment well    Behavior During Therapy [facility] for tasks assessed/performed               Past Medical History:  Diagnosis Date   ANEMIA-NOS    ANXIETY    Breast cancer ([place]) 1997 L, 2012 R   s/p chemo/xrt   COPD    resolved   DIVERTICULOSIS, COLON 2008   Dizziness    Family history of breast cancer    Family history of lung cancer    Family history of lymphoma    Family history of pancreatic cancer    Family history of uterine cancer    GERD    Hx of radiation therapy 10/19/11 -12/03/11   right breast   HYPERLIPIDEMIA    IRRITABLE BOWEL SYNDROME, HX OF    Left-sided carotid artery disease (HCC)    moderate left ICA stenosis   OSTEOARTHRITIS, HAND    PSVT (paroxysmal supraventricular tachycardia)    symptomatic on event monitor   Past Surgical History:  Procedure Laterality Date   ABDOMINAL HYSTERECTOMY     APPENDECTOMY     BREAST BIOPSY  11/14/10    r breast: inv, insitu mammary carcinoma w/calcif, er/pr +, her2 -   BREAST SURGERY     lumpectomy   CATARACT EXTRACTION     both eyes   ELECTROPHYSIOLOGIC STUDY N/A 05/10/2015   Procedure: SVT Ablation;  Surgeon: Will Meredith Leeds, MD;  Location: [place] [facility];  Service: Cardiovascular;  Laterality: N/A;   HERNIA REPAIR     inguinal herniorrhapy  1984   left   IR US GUIDE BX ASP/DRAIN  05/26/2019   rectal fissure repair     s/p benign breast biopsy  2003   right   s/p left foot surgury  2009   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   spiral fx left foot  2008   no surgury   TMJ ARTHROPLASTY   1989   TONGUE SURGERY     1988- to remove scar tissue growth    TONSILLECTOMY     Patient Active Problem List   Diagnosis Date Noted   Acute upper respiratory infection 05/16/2021   Nasal dryness 02/04/2021   Chronic sinusitis 11/25/2020   Allergic rhinitis 07/08/2020   Hypomagnesemia 06/10/2020   Goals of care, counseling/discussion 06/21/2019   Family history of breast cancer    Family history of pancreatic cancer    Family history of uterine cancer    Family history of lymphoma    Family history of lung cancer    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[place]) 06/02/2019   Recurrent cancer of left breast ([place]) 05/15/2019   Pes anserine bursitis 03/31/2019   HTN (hypertension) 02/10/2019   Hyperglycemia 06/21/2018   Left carpal tunnel syndrome 02/22/2018   Rotator cuff arthropathy of left shoulder 09/20/2017   Arthritis of hand 08/23/2017   Pain in right hand 08/23/2017   Cervical radiculitis 04/09/2017  Left shoulder pain 04/09/2017   Dyspnea on exertion 05/14/2016   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   Lymphedema of left arm 01/14/2016   Left arm swelling 12/25/2015   SVT (supraventricular tachycardia)    Left-sided carotid artery disease ([place]) 03/08/2015   Dizziness 01/24/2015   Urinary frequency 01/13/2015   Dizziness and giddiness 01/09/2015   Gallstones 02/21/2013   Malignant neoplasm of upper-inner quadrant of left breast in female, estrogen receptor positive ([place]) 11/28/2012   Muscle cramping 09/12/2012   Right hip pain 09/12/2012   Lower back pain 09/12/2012   COPD GOLD I     Hx of radiation therapy    Right shoulder pain 09/14/2011   Preventative health care 07/29/2010   Skin lesion of left leg 07/29/2010   Paresthesia 07/29/2010   GERD 03/28/2010   CONSTIPATION 03/28/2010   Osteoarthrosis, 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   Anxiety state 05/03/2009   Hyperlipidemia 06/14/2007   FATIGUE 06/14/2007   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  Rationale for Evaluation and Treatment Rehabilitation  SUBJECTIVE:                                                                                                                                                                                          SUBJECTIVE STATEMENT: Pt came in earlier today to meet with Primitivo Gauze to be measured for a different glove. The glove will be custom. Primitivo Gauze is comping her donning gloves and an arm butler to help her don her sleeve. It will be 7-10 days before the glove comes in.  PERTINENT HISTORY:  Hx breast cancer with 15 lymph node removal on the L and Bil lumpectomy. Lumpectomy on the L in 1997and the R in 2012. Radiation Bil.  New discovery of metastatic breast Cancer presently on Palbocidib and Anastrozole,  Fulvestrant since 06/2019.Recent PET scan on 10/09/2021 showed no evidence of recurrent or metastatic disease. Prior lymphedema treatment for Left UE in 2021  PAIN:  Are you having pain? No pain today NPRS scale: pt did not rate on scale Pain location: wrist and hand on left  Pain orientation: Left  PAIN TYPE: stabbing Pain description: intermittent  Aggravating factors: evening Relieving factors: Tribute wrap but doesn't wear the hand piece.  PRECAUTIONS: Other: Left UE lymphedema , Metastatic breast Cancer, COPD  WEIGHT BEARING RESTRICTIONS: No  FALLS:  Has patient fallen in last 6 months? No  LIVING ENVIRONMENT: Lives with: lives alone Lives in: House/apartment Stairs: No; External: 1 steps; on right going up Has following equipment at home:  None  OCCUPATION: retired  LEISURE: reading, puzzles, walks very little  HAND DOMINANCE: right   PRIOR LEVEL OF FUNCTION: Independent  PATIENT GOALS: Try and getting swelling in left arm down. Get a new compression sleeve that fits properly   OBJECTIVE:  COGNITION: Overall cognitive status: Within functional limits for tasks assessed   PALPATION: Mild pitting edema ulnar border of Left UE  OBSERVATIONS / OTHER ASSESSMENTS: Bandaid on forearm with small amount of clear  drainage showing through. Other aspect of incision that can be viewed is healed. Sensation to light touch in the hand is intact. Dorsum of hand is slightly swollen, and upper arm swollen above where she had TG soft folded over and with bottom folded over several inches, may be making hand swell. She complains of left wrist pain worse at night and has a weak left grip  SENSATION: Light touch: Appears intact throughout left UE   POSTURE: forward head, rounded shoulders  UPPER EXTREMITY AROM/PROM:  A/PROM RIGHT   eval   Shoulder extension   Shoulder flexion   Shoulder abduction   Shoulder internal rotation   Shoulder external rotation     (Blank rows = not tested)  A/PROM LEFT   eval  Shoulder extension   Shoulder flexion   Shoulder abduction   Shoulder internal rotation   Shoulder external rotation     (Blank rows = not tested)  CERVICAL AROM: All within functional limits:   UPPER EXTREMITY STRENGTH: NT  LYMPHEDEMA ASSESSMENTS:   SURGERY TYPE/DATE: Left breast Lumpectomy 1997, Right lumpectomy 2012  NUMBER OF LYMPH NODES REMOVED: 15 on left UE  CHEMOTHERAPY:Yes, presently on medications for metastasis  RADIATION:bilateral   HORMONE TREATMENT: yes  INFECTIONS: yes  LYMPHEDEMA ASSESSMENTS:   LANDMARK RIGHT  eval  10 cm proximal to olecranon process 27.5  Olecranon process 22.4  10 cm proximal to ulnar styloid process 19.0  Just proximal to ulnar styloid process 15.2  Across hand  at thumb web space 17.9  At base of 2nd digit 6.3  (Blank rows = not tested)  LANDMARK LEFT  eval LEFT 12/18/2021 LEFT 12/24/2021 LEFT 12/29/21 LEFT 01/01/2022 LEFT 01/20/2022 LEFT 01/29/2022 LEFT 02/04/2022 LEFT 02/09/2022  10 cm proximal to olecranon process 28.2 29.0 28.6 28.5 28 28.3 28.0 29.0 29.0  Olecranon process 27.5 25.3 25.6 25.5 25.6 25.5 24.5 25.0 25  10 cm proximal to ulnar styloid process 24.0 21 21.6 21.8 21.3 21.3 20.6 21.0 20.5  Just proximal to ulnar styloid process 18.2 17.7 18.2 17.5 17.7 17.8 16.7 17.0 17.3  Across hand at thumb web space 18.5 18.0 18.3 17.5 17.7 17.6 17.7 17.8 17.7  At base of 2nd digit 6.5 6.3 6.3 6.4 6.3 6.35 6.2 6.2 6.2  (Blank rows = not tested)  Wrist   16.4     Elbow  26           axilla  29.2,     MCPS17.3    wrist 16.8  (SLEEVE MEASUREMENTS)    LLIS : 47%   TODAY'S TREATMENT:  DATE  02/11/2021 Had pt try putting her sleeve on the arm butler which she did with minimal difficulty Manual lymph drainage in supine as follows: short neck, (no diaphragmatic breathing due to shortness of breath from medication) , left inguinal nodes, left axillo-inguinal anastamoses. Left upper extremity working proximal to distal then retracing all steps being very careful around area on forearm where pt recently had surgery. Compression bandaging: TG soft from hand to axilla, mollelast to digits 1-4 , artiflex from hand to axilla, 1 6cm bandage at hand, 1 8 cm bandage from wrist to axilla in herringbone, 1 12 cm bandage from hand to axilla with spiral.  02/09/2021 All wraps removed and pt remeasured. Slight irritation again noted above elbow but no skin break down Practiced donning sleeve several different ways to see how pt did the best. Tried Juzo foot slip eze over hand with pt pulling sleeve over it. Pt did fairly well initially but got the  sleeve bunched up and was not able to get it any higher without help. She did well with the rubber gloves and mild assist but was not quite getting high enough without help. Removed sleeve and had pt try the arm butler. Therapist placed sleeve over butler and pt felt she would be able to do the same.  She did much better getting her arm into the sleeve and was only lacking getting it about an inch from the top. She was able to move it up with the rubber glove. When on correctly sleeve seemed to fit well. Pt had only received 2 Medi harmony gloves and we are not sure where they came from as we ordered an espirit glove. Demetrius Charity was too big and was not flat knit so removed pt sleeve and therapist wrapped pts arm: applied cocoa butter then Compression bandaging: TG soft from hand to axilla, mollelast to digits 1-4 , artiflex from hand to axilla, 1 6cm bandage at hand, 1 8 cm bandage from wrist to axilla in herringbone, 1 12 cm bandage from hand to axilla with spiral. Manus Gunning PT also looked at pts sleeve and invoice which showed that a medi espirit glove was ordered. Pt will check to see if she can find another glove at home. Not sure how she received 2 harmony gloves    02/04/2021 Arm remeasured. Slight irritation proximal to elbow where wrap was on for 6 days Manual lymph drainage in supine as follows: short neck, (no diaphragmatic breathing due to shortness of breath from medication) , left inguinal nodes, left axillo-inguinal anastamoses. Left upper extremity working proximal to distal then retracing all steps being very careful around area on forearm where pt recently had surgery. Compression bandaging: TG soft from hand to axilla, mollelast to digits 1-4 , artiflex from hand to axilla, 1 6cm bandage at hand, 1 8 cm bandage from wrist to axilla in herringbone, 1 12 cm bandage from hand to axilla with spiral.(Gave pt new wraps are hers are very stretched out.   01/27/2022 Manual lymph drainage  in supine as follows: short neck, (no diaphragmatic breathing due to shortness of breath from medication) , left inguinal nodes, left axillo-inguinal anastamoses. Left upper extremity working proximal to distal then retracing all steps being very careful around area on forearm where pt recently had surgery. Compression bandaging: TG soft from hand to axilla, mollelast to digits 1-4 , artiflex from hand to axilla, 1 6cm bandage at hand, 1 8 cm bandage from wrist to axilla in herringbone, 1  12 cm bandage from hand to axilla with spiral.   01/29/2022 Wraps removed and pt remeasured Manual lymph drainage in supine as follows: short neck, (no diaphragmatic breathing due to shortness of breath from medication) , left inguinal nodes, left axillo-inguinal anastamoses. Left upper extremity working proximal to distal then retracing all steps being very careful around area on forearm where pt recently had surgery. Compression bandaging: TG soft from hand to axilla, mollelast to digits 1-4 , artiflex from hand to axilla, 1 6cm bandage at hand, 1 8 cm bandage from wrist to axilla in herringbone, 1 12 cm bandage from hand to axilla with spiral.   01/27/2022 Manual lymph drainage in supine as follows: short neck, (no diaphragmatic breathing due to shortness of breath from medication) , left inguinal nodes, left axillo-inguinal anastamoses. Left upper extremity working proximal to distal then retracing all steps being very careful around area on forearm where pt recently had surgery. Compression bandaging: TG soft from hand to axilla, mollelast to digits 1-4 , artiflex from hand to axilla, 1 6cm bandage at hand, 1 8 cm bandage from wrist to axilla in herringbone, 1 12 cm bandage from hand to axilla with spiral.    01/20/2022 Manual lymph drainage in supine as follows: short neck, (no diaphragmatic breathing due to shortness of breath from medication) , left inguinal nodes, left axillo-inguinal anastamoses. Left  upper extremity working proximal to distal then retracing all steps being very careful around area on forearm where pt recently had surgery. Pt remeasured with measurements remaining fairly stable Compression bandaging: TG soft from hand to axilla, mollelast to digits 1-4 covered with paper tape, artiflex from hand to axilla, 1 6cm bandage at hand, 1 8 cm bandage from wrist to axilla in herringbone, 1 12 cm bandage from hand to axilla with spiral.   01/15/2022 Manual lymph drainage in supine as follows: short neck, (no diaphragmatic breathing due to shortness of breath from medication) , left inguinal nodes, left axillo-inguinal anastamoses. Left upper extremity working proximal to distal then retracing all steps being very careful around area on forearm where pt recently had surgery. Compression bandaging: TG soft from hand to axilla, mollelast to digits 1-4 covered with paper tape, artiflex from hand to axilla, 1 6cm bandage at hand, 1 8 cm bandage from wrist to axilla in herringbone, 1 12 cm bandage from hand to axilla with spiral.    01/12/2022 Discussed compression sleeves and option for custom flat knit vs. Circular knit like Bella Strong. Pt has opted for custom flat knit and will be measured by Primitivo Gauze on Wed. Manual lymph drainage in supine as follows: short neck, (no diaphragmatic breathing due to shortness of breath from medication) , left inguinal nodes, left axillo-inguinal anastamoses. Left upper extremity working proximal to distal then retracing all steps being very careful around area on forearm where pt recently had surgery. Compression bandaging: TG soft from hand to axilla, mollelast to digits 1-4 covered with paper tape, artiflex from hand to axilla, 1 6cm bandage at hand, 1 8 cm bandage from wrist to axilla in herringbone, 1 12 cm bandage from hand to axilla with spiral.   01/08/2022 Pt brought all of her garments with her. Showed pt how to don Circaid Night garment using tabs  on sleeve. She had some difficulty but when taking her time was able to get it on properly. She could not get power sleeve on and did not like it. Tried donning class 2 Medi Esprit day sleeve but pt  was unable to get it over her wrist. Also tried EZ Slide and it was better, but pt was still not able to do independently and did not think she would be able to due to weakness in her right hand. Due to time running out told pt I will review other options for sleeve and will discuss with her next visit. She will return the [employer] sleeves. Compression bandaging: TG soft from hand to axilla, mollelast to digits 1-4 covered with paper tape, artiflex from hand to axilla, 1 6cm bandage at hand, 1 8 cm bandage from wrist to axilla in herringbone, 1 12 cm bandage from hand to axilla with spiral.  01/05/2022 Measured pt for CircAid Profile night time garment and she fit in to a size 2. Educated pt that it is important she has a well fitting nighttime garment to keep fluid from collecting in her arm overnight. Her current garment does not fit well and is too short. Assisted pt with ordering a Medi Mondi Espirit size 1 sleeve and glove for day time management.  Manual lymph drainage in supine as follows: short neck, (no diaphragmatic breathing due to shortness of breath from medication) , left inguinal nodes, left axillo-inguinal anastamoses. Left upper extremity working proximal to distal then retracing all steps being very careful around area on forearm where pt recently had surgery. Compression bandaging: TG soft from hand to axilla, mollelast to digits 1-4 covered with paper tape, artiflex from hand to axilla, 1 6cm bandage at hand, 1 8 cm bandage from wrist to axilla in herringbone, 1 12 cm bandage from hand to axilla with spiral.  01/01/2022 Pt brought in old Juzo sleeve that is too big at the top. Class 1 circular knit Size IV. She does not have a handpiece. Also brought in her tribute night without the hand  piece. Pt has tribute very loose so not really helping much, but she is not able to tighten the Velcro straps with it on. Sleeve is too short. Adjusted velcro straps to make slightly tighter. We may have an oversleeve in the back that would help her get it slightly snugger. Remeasured circumferences for PT note and compression sleeve/gauntlet. Manual lymph drainage in supine as follows: short neck, (no diaphragmatic breathing due to shortness of breath from medication) , left inguinal nodes, left axillo-inguinal anastamoses. Left upper extremity working proximal to distal then retracing all steps being very careful around area on forearm where pt recently had surgery. Compression bandaging: TG soft from hand to axilla, mollelast to digits 1-4 covered with paper tape, artiflex from hand to axilla, 1 6cm bandage at hand, 1 8 cm bandage from wrist to axilla in herringbone, 1 12 cm bandage from hand to axilla with spiral. Pt does not fit into a Juzo off the shelf . Searched garments after pt left. She does fit into Size 1 Medi [facility] Esprit sleeve/glove standard length, or a Medi Harmony size 2 glove, Exostrong sleeve small short May want to wait until January to consider a Circaid night Eval: Discussed POC and wrapping with pt. Pt is agreeable to try wrapping for 2 weeks to see if her arm would reduce. Swelling is soft and with compliance should reduce so she can be fit for a new sleeve. Discussed caution folding over TG soft too much because of increasing the pressure possibly increasing swelling at hand and at upper arm.Marland Kitchen PATIENT EDUCATION:  Education details: discussed POC, bandaging, new garments, need for 3x/week, instruct family member for weekends  if she would like. Person educated: Patient Education method: Explanation Education comprehension: verbalized understanding  HOME EXERCISE PROGRAM: None given today  ASSESSMENT:  CLINICAL IMPRESSION: Pt measured for custom glove to match her sleeve  which will be in in 7-10 days. She was able to get the sleeve over the arm butler with minimal difficulty. She is frustrated to have to wait for her garments but she should have everything she needs in a week or so. WE will continue 2x per week until then, OBJECTIVE IMPAIRMENTS: decreased activity tolerance, decreased knowledge of condition, decreased ROM, increased edema, impaired UE functional use, postural dysfunction, and pain.   ACTIVITY LIMITATIONS: lifting, dressing, and reach over head  PARTICIPATION LIMITATIONS: cleaning, yard work, and reaching activities  PERSONAL FACTORS: 3+ comorbidities: Breast Cancer s/p radiation and with metastasis, COPD, OA, current cancer meds  are also affecting patient's functional outcome.   REHAB POTENTIAL: Good  CLINICAL DECISION MAKING: Stable/uncomplicated  EVALUATION COMPLEXITY: Low  GOALS: Goals reviewed with patient? Yes  SHORT TERM GOALS: Target date: 03/04/2022    Pt will be tolerant of compression bandaging Baseline: Goal status: MET  12/18/2021 2.  Pt will have reduction at olecranon and 10 cm prox to ulna by 1.5 cm Baseline: Goal status: MET 12/18/2021  LONG TERM GOALS: Target date: 03/25/2022    Pt will have decreased edema at olecranon and 10 cm prox to ulna styloid by 2.5 cm Baseline: 24.0 Goal status: MET 21.3 cm on 01/05/22  2.  Pt will be fit for appropriate compression sleeve Baseline:  Goal status: IN PROGRESS sleeve ordered 01/05/22 and night time garment and pt is awaiting arrival  3.  Pt will be independent with self care of lymphedema including day and night compression Baseline:  Goal status: IN PROGRESS  4.  Pt will improve LLIS to no greater than 30% to demonstrate improved function  Baseline:  Goal status: IN PROGRESS    PLAN:  PT FREQUENCY: 2x/week  PT DURATION: 4 weeks  PLANNED INTERVENTIONS: Therapeutic exercises, Patient/Family education, Self Care, Orthotic/Fit training, Manual lymph drainage,  Compression bandaging, and Manual therapy  PLAN FOR NEXT SESSION: Continue until pt receives her new day sleeve, Measure next visit,, how were bandages?, continue CDT. Awaiting new daytime sleeve, has already received Circaid Night  Claris Pong, PT 02/11/2022, 4:05 PM

## 2022-02-12 ENCOUNTER — Telehealth: Payer: Self-pay | Admitting: Pharmacy Technician

## 2022-02-12 ENCOUNTER — Encounter: Payer: Self-pay | Admitting: Oncology

## 2022-02-12 ENCOUNTER — Other Ambulatory Visit (HOSPITAL_COMMUNITY): Payer: Self-pay

## 2022-02-12 ENCOUNTER — Inpatient Hospital Stay: Payer: Medicare Other | Attending: Adult Health | Admitting: Hematology and Oncology

## 2022-02-12 ENCOUNTER — Inpatient Hospital Stay: Payer: Medicare Other

## 2022-02-12 ENCOUNTER — Other Ambulatory Visit: Payer: Self-pay

## 2022-02-12 VITALS — BP 141/64 | HR 82 | Temp 97.9°F | Resp 16 | Ht 63.0 in | Wt 145.7 lb

## 2022-02-12 DIAGNOSIS — C7951 Secondary malignant neoplasm of bone: Secondary | ICD-10-CM | POA: Insufficient documentation

## 2022-02-12 DIAGNOSIS — Z87891 Personal history of nicotine dependence: Secondary | ICD-10-CM | POA: Insufficient documentation

## 2022-02-12 DIAGNOSIS — C50212 Malignant neoplasm of upper-inner quadrant of left female breast: Secondary | ICD-10-CM | POA: Insufficient documentation

## 2022-02-12 DIAGNOSIS — Z5111 Encounter for antineoplastic chemotherapy: Secondary | ICD-10-CM | POA: Insufficient documentation

## 2022-02-12 DIAGNOSIS — Z170 Estrogen receptor positive status [ER+]: Secondary | ICD-10-CM

## 2022-02-12 DIAGNOSIS — Z79811 Long term (current) use of aromatase inhibitors: Secondary | ICD-10-CM | POA: Diagnosis not present

## 2022-02-12 DIAGNOSIS — M8580 Other specified disorders of bone density and structure, unspecified site: Secondary | ICD-10-CM | POA: Insufficient documentation

## 2022-02-12 LAB — CBC WITH DIFFERENTIAL (CANCER CENTER ONLY)
Abs Immature Granulocytes: 0 10*3/uL (ref 0.00–0.07)
Basophils Absolute: 0 10*3/uL (ref 0.0–0.1)
Basophils Relative: 1 %
Eosinophils Absolute: 0.1 10*3/uL (ref 0.0–0.5)
Eosinophils Relative: 2 %
HCT: 28.4 % — ABNORMAL LOW (ref 36.0–46.0)
Hemoglobin: 9.4 g/dL — ABNORMAL LOW (ref 12.0–15.0)
Immature Granulocytes: 0 %
Lymphocytes Relative: 25 %
Lymphs Abs: 0.6 10*3/uL — ABNORMAL LOW (ref 0.7–4.0)
MCH: 36 pg — ABNORMAL HIGH (ref 26.0–34.0)
MCHC: 33.1 g/dL (ref 30.0–36.0)
MCV: 108.8 fL — ABNORMAL HIGH (ref 80.0–100.0)
Monocytes Absolute: 0.3 10*3/uL (ref 0.1–1.0)
Monocytes Relative: 11 %
Neutro Abs: 1.4 10*3/uL — ABNORMAL LOW (ref 1.7–7.7)
Neutrophils Relative %: 61 %
Platelet Count: 112 10*3/uL — ABNORMAL LOW (ref 150–400)
RBC: 2.61 MIL/uL — ABNORMAL LOW (ref 3.87–5.11)
RDW: 14.8 % (ref 11.5–15.5)
WBC Count: 2.4 10*3/uL — ABNORMAL LOW (ref 4.0–10.5)
nRBC: 0 % (ref 0.0–0.2)

## 2022-02-12 LAB — CMP (CANCER CENTER ONLY)
ALT: 11 U/L (ref 0–44)
AST: 17 U/L (ref 15–41)
Albumin: 4.4 g/dL (ref 3.5–5.0)
Alkaline Phosphatase: 59 U/L (ref 38–126)
Anion gap: 12 (ref 5–15)
BUN: 31 mg/dL — ABNORMAL HIGH (ref 8–23)
CO2: 17 mmol/L — ABNORMAL LOW (ref 22–32)
Calcium: 9.8 mg/dL (ref 8.9–10.3)
Chloride: 110 mmol/L (ref 98–111)
Creatinine: 1.11 mg/dL — ABNORMAL HIGH (ref 0.44–1.00)
GFR, Estimated: 50 mL/min — ABNORMAL LOW (ref >60)
Glucose, Bld: 93 mg/dL (ref 70–99)
Potassium: 4.2 mmol/L (ref 3.5–5.1)
Sodium: 139 mmol/L (ref 135–145)
Total Bilirubin: 0.3 mg/dL (ref 0.3–1.2)
Total Protein: 7.9 g/dL (ref 6.5–8.1)

## 2022-02-12 LAB — MAGNESIUM: Magnesium: 1.2 mg/dL — ABNORMAL LOW (ref 1.7–2.4)

## 2022-02-12 MED ORDER — FULVESTRANT 250 MG/5ML IM SOSY
500.0000 mg | PREFILLED_SYRINGE | Freq: Once | INTRAMUSCULAR | Status: AC
  Administered 2022-02-12: 500 mg via INTRAMUSCULAR
  Filled 2022-02-12: qty 10

## 2022-02-12 NOTE — Progress Notes (Signed)
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   #2 status post 4 cycles of doxorubicin and cyclophosphamide,                          (i) followed by radiation therapy under the care of Dr. [name].   #3 received tamoxifen for a total of seven years    RIGHT BREAST #4  S/P biopsy of the RIGHT breast upper inner quadrant on 11/14/10 showing invasive ductal carcinoma,, grade 2, estrogen receptor 85% and progesterone receptor 57% positive, Ki67 20%, HER2 not amplified.     #5 started neoadjuvant letrozole in November 2012; switched to tamoxifen as of February 2016 due to osteopenia concerns   #6  S/P right lumpectomy with sentinel lymph node biopsy on 09/03/11 for a ypT2, ypN1a, stage IIB invasive lobular carcinoma, grade 2,estrogen receptor 97% positive, progesterone receptor 12% positive, with no HER-2 amplification.   #7 status post right breast radiation therapy under the care of Dr. [name] from 10/19/2011 to 12/03/2011.    #8 did not meet criteria for genetic testing according to her insurance company.   #9 osteopenia, with a T score of -1.8 on DEXA scan at [facility] 03/07/2013             (a) status post multiple dental extractions and implants             (b) repeat bone density at [facility] 04/02/2015 shows a T score of -2.0   METASTATIC DISEASE: April 2021 (lymph nodes, bone)   #10:  left upper extremity lymphedema led to chest CT scan 05/09/2019 showing a 5.1 cm left subpectoral chest wall mass and thoracic lymphadenopathy              (a) CT biopsy of the chest wall mass 05/25/2019 confirms recurrent breast cancer, strongly estrogen and  progesterone receptor positive, HER-2 not amplified             (b) PET scan 05/30/2019 shows a left subpectoral mass measuring 5.4 cm, with significant regional and mediastinal adenopathy, sclerotic left scapular metastasis, but no liver or lung involvement.             (c) CA 27-29 is moderately informative   #11 anastrozole started 06/02/2019; palbociclib added 06/08/2019             (a) palbociclib taken irregularly for several months secondary to cytopenias             (b) palbociclib dose decreased to 100 mg daily 21 on 7 off October 2021             (c) CT of the chest with contrast 11/09/2019 shows mild disease progression (despite a continuing drop in the CA 27-29)             (d) fulvestrant added 11/23/2019             (e) repeat chest CT with contrast 09/09/2020 read as stable.   #12 denosumab/Xgeva starting 06/08/2019, to be repeated every 3 months due to hypocalcemia             (a) treatment  held after August 2022 dose secondary to ongoing dental issues   #13 genetics testing 06/15/2019 through the [facility] Common Hereditary Cancers Panel found no deleterious mutations in APC, ATM, AXIN2, BARD1, BMPR1A, BRCA1, BRCA2, BRIP1, CDH1, CDKN2A (p14ARF), CDKN2A (p16INK4a), CKD4, CHEK2, CTNNA1, DICER1, EPCAM (Deletion/duplication testing only), GREM1 (promoter region deletion/duplication testing only), KIT, MEN1, MLH1, MSH2, MSH3, MSH6, MUTYH, NBN, NF1, NHTL1, PALB2, PDGFRA, PMS2, POLD1, POLE, PTEN, RAD50, RAD51C, RAD51D, RNF43, SDHB, SDHC, SDHD, SMAD4, SMARCA4. STK11, TP53, TSC1, TSC2, and VHL.  The following genes were evaluated for sequence changes only: SDHA and HOXB13 c.251G>A variant only.  CURRENT THERAPY: Anastrozole, Palbociclib, Fulvestrant  INTERVAL HISTORY:  Stacey [name] 82 y.o. female returns for follow-up follow-up of her metastatic breast cancer.   PET imaging Sep 2023, no recurrent or met disease. Stable left sclerotic scapular metastasis. Since last visit here, she  continues to report ongoing lymphedema issues, anxiety, fatigue and ongoing diarrhea.  She is however able to manage the diarrhea with Imodium as needed.  She continues to have severe hypomagnesemia and continues oral magnesium supplement but again has to limit this given diarrhea.  She denies any changes in the breast, no change in breathing, urinary habits.  No new neurological complaints. Rest of the pertinent 10 point ROS reviewed and negative  Patient Active Problem List   Diagnosis Date Noted   Acute upper respiratory infection 05/16/2021   Nasal dryness 02/04/2021   Chronic sinusitis 11/25/2020   Allergic rhinitis 07/08/2020   Hypomagnesemia 06/10/2020   Goals of care, counseling/discussion 06/21/2019   Family history of breast cancer    Family history of pancreatic cancer    Family history of uterine cancer    Family history of lymphoma    Family history of lung cancer    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[facility]) 06/02/2019   Recurrent cancer of left breast ([facility]) 05/15/2019   Pes anserine bursitis 03/31/2019   HTN (hypertension) 02/10/2019   Hyperglycemia 06/21/2018   Left carpal tunnel syndrome 02/22/2018   Rotator cuff arthropathy of left shoulder 09/20/2017   Arthritis of hand 08/23/2017   Pain in right hand 08/23/2017   Cervical radiculitis 04/09/2017   Left shoulder pain 04/09/2017   Dyspnea on exertion 05/14/2016   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   Lymphedema of left arm 01/14/2016   Left arm swelling 12/25/2015   SVT (supraventricular tachycardia)    Left-sided carotid artery disease ([facility]) 03/08/2015   Dizziness 01/24/2015   Urinary frequency 01/13/2015   Dizziness and giddiness 01/09/2015   Gallstones 02/21/2013   Malignant neoplasm of upper-inner quadrant of left breast in female, estrogen receptor positive ([facility]) 11/28/2012   Muscle cramping 09/12/2012   Right hip pain 09/12/2012   Lower back pain 09/12/2012   COPD GOLD I      Hx of radiation therapy    Right shoulder pain 09/14/2011   Preventative health care 07/29/2010   Skin lesion of left leg 07/29/2010   Paresthesia 07/29/2010   GERD 03/28/2010   CONSTIPATION 03/28/2010   Osteoarthrosis, 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  MEDICAL HISTORY: Past Medical History:  Diagnosis Date   ANEMIA-NOS    ANXIETY    Breast cancer ([facility]) 1997 L,  2012 R   s/p chemo/xrt   COPD    resolved   DIVERTICULOSIS, COLON 2008   Dizziness    Family history of breast cancer    Family history of lung cancer    Family history of lymphoma    Family history of pancreatic cancer    Family history of uterine cancer    GERD    Hx of radiation therapy 10/19/11 -12/03/11   right breast   HYPERLIPIDEMIA    IRRITABLE BOWEL SYNDROME, HX OF    Left-sided carotid artery disease (HCC)    moderate left ICA stenosis   OSTEOARTHRITIS, HAND    PSVT (paroxysmal supraventricular tachycardia)    symptomatic on event monitor    SURGICAL HISTORY: Past Surgical History:  Procedure Laterality Date   ABDOMINAL HYSTERECTOMY     APPENDECTOMY     BREAST BIOPSY  11/14/10    r breast: inv, insitu mammary carcinoma w/calcif, er/pr +, her2 -   BREAST SURGERY     lumpectomy   CATARACT EXTRACTION     both eyes   ELECTROPHYSIOLOGIC STUDY N/A 05/10/2015   Procedure: SVT Ablation;  Surgeon: Will Meredith Leeds, MD;  Location: [facility];  Service: Cardiovascular;  Laterality: N/A;   HERNIA REPAIR     inguinal herniorrhapy  1984   left   IR US GUIDE BX ASP/DRAIN  05/26/2019   rectal fissure repair     s/p benign breast biopsy  2003   right   s/p left foot surgury  2009   s/p lumpectomy  1997    melignant left x 2   spiral fx left foot  2008   no surgury   TMJ ARTHROPLASTY  1989   TONGUE SURGERY     1988- to remove scar tissue growth    TONSILLECTOMY      SOCIAL HISTORY: Social History   Socioeconomic History   Marital status: Divorced    Spouse name: 2 Step-children   Number of children: 2   Years of education: Not on file   Highest education level: Not on file  Occupational History   Occupation: retired Banker  Tobacco Use   Smoking status: Former    Packs/day: 1.00    Years: 54.00    Total pack years: 54.00    Types: Cigarettes    Quit date: 01/03/2016    Years since quitting: 6.1   Smokeless tobacco: Never  Vaping Use   Vaping Use: Never used  Substance and Sexual Activity   Alcohol use: Yes    Comment: rare/ drinks socially   Drug use: No   Sexual activity: Never  Other Topics Concern   Not on file  Social History Narrative   Patient gets no regular exercise   No biological children   2 step children   Social Determinants of Health   Financial Resource Strain: Low Risk  (12/02/2021)   Overall Financial Resource Strain (CARDIA)    Difficulty of Paying Living Expenses: Not hard at all  Food Insecurity: No Food Insecurity (12/02/2021)   Hunger Vital Sign    Worried About Running Out of Food in the Last Year: Never true    Ran Out of Food in the Last Year: Never true  Transportation Needs: No Transportation Needs (12/02/2021)   PRAPARE - Hydrologist (Medical): No    Lack of Transportation (Non-Medical): No  Physical Activity: Sufficiently Active (12/02/2021)   Exercise Vital Sign    Days of Exercise per  Week: 5 days    Minutes of Exercise per Session: 30 min  Stress: No Stress Concern Present (12/02/2021)   [place]    Feeling of Stress : Only a little  Social Connections: Socially Isolated (12/02/2021)   Social Connection and Isolation Panel  [NHANES]    Frequency of Communication with Friends and Family: More than three times a week    Frequency of Social Gatherings with Friends and Family: More than three times a week    Attends Religious Services: Never    Marine scientist or Organizations: No    Attends Archivist Meetings: Never    Marital Status: Divorced  Human resources officer Violence: Not At Risk (12/02/2021)   Humiliation, Afraid, Rape, and Kick questionnaire    Fear of Current or Ex-Partner: No    Emotionally Abused: No    Physically Abused: No    Sexually Abused: No    FAMILY HISTORY: Family History  Problem Relation Age of Onset   Hypertension Mother    Stroke Mother    Colon polyps Mother    Diabetes Mother    Pancreatic cancer Mother 36   Uterine cancer Mother 56   Lymphoma Brother        burkitts   Lung cancer Paternal Uncle    Lung cancer Maternal Grandmother 54       non-smoker   Lung cancer Maternal Grandfather    Breast cancer Cousin        maternal cousin, dx in her mid 72s   Brain cancer Cousin        maternal cousin's son; dx in his 70s   Testicular cancer Cousin        maternal cousin's son;    Breast cancer Cousin        paternal cousin; dx in her 12s   Breast cancer Cousin        paternal cousin's daughter; dx in 79s; neg genetic testing   Colon cancer Neg Hx    Esophageal cancer Neg Hx    Stomach cancer Neg Hx    Rectal cancer Neg Hx     Review of Systems  Constitutional:  Positive for fatigue. Negative for appetite change, chills, fever and unexpected weight change.  HENT:   Negative for hearing loss, lump/mass and trouble swallowing.   Eyes:  Negative for eye problems and icterus.  Respiratory:  Negative for chest tightness, cough and shortness of breath.   Cardiovascular:  Negative for chest pain, leg swelling and palpitations.  Gastrointestinal:  Positive for diarrhea. Negative for abdominal distention, abdominal pain, constipation, nausea and vomiting.   Endocrine: Negative for hot flashes.  Genitourinary:  Negative for difficulty urinating.   Musculoskeletal:  Positive for arthralgias.  Skin:  Negative for itching and rash.  Neurological:  Negative for dizziness, extremity weakness, headaches and numbness.  Hematological:  Negative for adenopathy. Does not bruise/bleed easily.  Psychiatric/Behavioral:  Negative for depression. The patient is not nervous/anxious.       PHYSICAL EXAMINATION  ECOG PERFORMANCE STATUS: 1 - Symptomatic but completely ambulatory  Vitals:   02/12/22 1335  BP: (!) 141/64  Pulse: 82  Resp: 16  Temp: 97.9 F (36.6 C)  SpO2: 95%    Physical Exam Constitutional:      Appearance: Normal appearance.  Cardiovascular:     Rate and Rhythm: Normal rate and regular rhythm.  Chest:     Comments: Breast exam deferred today Abdominal:  General: Abdomen is flat.     Palpations: Abdomen is soft.  Musculoskeletal:        General: No swelling or tenderness.     Cervical back: Normal range of motion and neck supple. No rigidity.  Lymphadenopathy:     Cervical: No cervical adenopathy.  Skin:    General: Skin is warm and dry.  Neurological:     Mental Status: She is alert.  Psychiatric:        Mood and Affect: Mood normal.      LABORATORY DATA: Appointment on 02/12/2022  Component Date Value Ref Range Status   Magnesium 02/12/2022 1.2 (L)  1.7 - 2.4 mg/dL Final   Performed at [facility], [address], [place], [place] [address]   Sodium 02/12/2022 139  135 - 145 mmol/L Final   Potassium 02/12/2022 4.2  3.5 - 5.1 mmol/L Final   Chloride 02/12/2022 110  98 - 111 mmol/L Final   CO2 02/12/2022 17 (L)  22 - 32 mmol/L Final   Glucose, Bld 02/12/2022 93  70 - 99 mg/dL Final   Glucose reference range applies only to samples taken after fasting for at least 8 hours.   BUN 02/12/2022 31 (H)  8 - 23 mg/dL Final   Creatinine 02/12/2022 1.11 (H)  0.44 - 1.00 mg/dL Final   Calcium  02/12/2022 9.8  8.9 - 10.3 mg/dL Final   Total Protein 02/12/2022 7.9  6.5 - 8.1 g/dL Final   Albumin 02/12/2022 4.4  3.5 - 5.0 g/dL Final   AST 02/12/2022 17  15 - 41 U/L Final   ALT 02/12/2022 11  0 - 44 U/L Final   Alkaline Phosphatase 02/12/2022 59  38 - 126 U/L Final   Total Bilirubin 02/12/2022 0.3  0.3 - 1.2 mg/dL Final   GFR, Estimated 02/12/2022 50 (L)  >60 mL/min Final   Comment: (NOTE) Calculated using the CKD-EPI Creatinine Equation (2021)    Anion gap 02/12/2022 12  5 - 15 Final   Performed at [facility], [place] [address] [place], [place] [address]   WBC Count 02/12/2022 2.4 (L)  4.0 - 10.5 K/uL Final   RBC 02/12/2022 2.61 (L)  3.87 - 5.11 MIL/uL Final   Hemoglobin 02/12/2022 9.4 (L)  12.0 - 15.0 g/dL Final   HCT 02/12/2022 28.4 (L)  36.0 - 46.0 % Final   MCV 02/12/2022 108.8 (H)  80.0 - 100.0 fL Final   MCH 02/12/2022 36.0 (H)  26.0 - 34.0 pg Final   MCHC 02/12/2022 33.1  30.0 - 36.0 g/dL Final   RDW 02/12/2022 14.8  11.5 - 15.5 % Final   Platelet Count 02/12/2022 112 (L)  150 - 400 K/uL Final   nRBC 02/12/2022 0.0  0.0 - 0.2 % Final   Neutrophils Relative % 02/12/2022 61  % Final   Neutro Abs 02/12/2022 1.4 (L)  1.7 - 7.7 K/uL Final   Lymphocytes Relative 02/12/2022 25  % Final   Lymphs Abs 02/12/2022 0.6 (L)  0.7 - 4.0 K/uL Final   Monocytes Relative 02/12/2022 11  % Final   Monocytes Absolute 02/12/2022 0.3  0.1 - 1.0 K/uL Final   Eosinophils Relative 02/12/2022 2  % Final   Eosinophils Absolute 02/12/2022 0.1  0.0 - 0.5 K/uL Final   Basophils Relative 02/12/2022 1  % Final   Basophils Absolute 02/12/2022 0.0  0.0 - 0.1 K/uL Final   Immature Granulocytes 02/12/2022 0  % Final   Abs  Immature Granulocytes 02/12/2022 0.00  0.00 - 0.07 K/uL Final   Performed at [facility], [place] [address] [place],  [address]     ASSESSMENT and THERAPY PLAN:   This is a very pleasant 82 year old female patient with  metastatic breast cancer currently on palbociclib, and anastrozole as well as fulvestrant since about May 2021 who is here for follow-up.   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 She continues on Ibrance/faslodex/anastrozole Last imaging in Sep with no concern for progression Labs consistent with Ibrance related cytopenia No concerns on exam today for progression.   She will continue Faslodex injection every 28 days Repeat imaging anticipated in February or March 2024.  She continues on oral mag replacement.  She has severe hypomagnesemia at baseline.   She is a very hard stick and would rather continue oral magnesium since this is overall stable.  Will continue to monitor her magnesium.  All questions were answered. The patient knows to call the clinic with any problems, questions or concerns. We can certainly see the patient much sooner if necessary.  Total encounter time:30 minutes*in face-to-face visit time, chart review, lab review, care coordination, order entry, and documentation of the encounter time.   *Total Encounter Time as defined by the Centers for Medicare and Medicaid Services includes, in addition to the face-to-face time of a patient visit (documented in the note above) non-face-to-face time: obtaining and reviewing outside history, ordering and reviewing medications, tests or procedures, care coordination (communications with other health care professionals or caregivers) and documentation in the medical record.

## 2022-02-12 NOTE — Telephone Encounter (Signed)
Oral Oncology Patient Advocate Encounter   Was successful in securing patient a $6,000 grant from [employer] to provide copayment coverage for Ibrance.  This will keep the out of pocket expense at $0.     I have spoken with the patient.    The billing information is as follows and has been shared with [facility].   Member ID: [id number] Group ID: [id number] RxBin: [id number] PCN: [id number] Dates of Eligibility: 02/12/22 through 02/13/23  Fund name:  Metastatic Breast.   Lady Deutscher, CPhT-Adv Oncology Pharmacy Patient [place] Direct Number: [phone]  Fax: [phone]

## 2022-02-12 NOTE — Telephone Encounter (Signed)
Oral Oncology Patient Advocate Encounter  Patient is eligible to receive funding through the [facility]. This funding must be exhausted before the patient can re-enroll in patient assistance.  I have spoken with the patient.  This case will be re-opened at a later time if needed.  Lady Deutscher, CPhT-Adv Oncology Pharmacy Patient [place] Direct Number: [phone]  Fax: [phone]

## 2022-02-12 NOTE — Patient Instructions (Signed)

## 2022-02-12 NOTE — Telephone Encounter (Signed)
Oral Oncology Patient Advocate Encounter  Was successful in securing patient a $15,000 grant from [employer] to provide copayment coverage for Stacey [name].  This will keep the out of pocket expense at $0.     [facility] ID: [id number]  I have spoken with the patient.   The billing information is as follows and has been shared with [facility].    RxBin: [id number] PCN: [id number] Member ID: [id number] Group ID: [id number] Dates of Eligibility: 01/13/22 through 01/13/23  Fund:  [place], CPhT-Adv Oncology Pharmacy Patient [place] Direct Number: [phone]  Fax: [phone]

## 2022-02-13 ENCOUNTER — Encounter: Payer: Medicare Other | Admitting: Rehabilitation

## 2022-02-13 LAB — CANCER ANTIGEN 27.29: CA 27.29: 46.7 U/mL — ABNORMAL HIGH (ref 0.0–38.6)

## 2022-02-16 ENCOUNTER — Ambulatory Visit: Payer: Medicare Other

## 2022-02-16 DIAGNOSIS — C44619 Basal cell carcinoma of skin of left upper limb, including shoulder: Secondary | ICD-10-CM

## 2022-02-16 DIAGNOSIS — C44629 Squamous cell carcinoma of skin of left upper limb, including shoulder: Secondary | ICD-10-CM

## 2022-02-16 DIAGNOSIS — I890 Lymphedema, not elsewhere classified: Secondary | ICD-10-CM | POA: Diagnosis not present

## 2022-02-16 NOTE — Therapy (Signed)
OUTPATIENT PHYSICAL THERAPY ONCOLOGY TREATMENT  Patient Name: Stacey [name] MRN: [id number] DOB:1940/12/10, 82 y.o., female Today's Date: 02/16/2022     PT End of Session - 02/16/22 1509     Visit Number 20    Number of Visits 25    Date for PT Re-Evaluation 03/04/22    PT Start Time 1509    PT Stop Time 0488    PT Time Calculation (min) 46 min    Activity Tolerance Patient tolerated treatment well    Behavior During Therapy [facility] for tasks assessed/performed               Past Medical History:  Diagnosis Date   ANEMIA-NOS    ANXIETY    Breast cancer ([place]) 1997 L, 2012 R   s/p chemo/xrt   COPD    resolved   DIVERTICULOSIS, COLON 2008   Dizziness    Family history of breast cancer    Family history of lung cancer    Family history of lymphoma    Family history of pancreatic cancer    Family history of uterine cancer    GERD    Hx of radiation therapy 10/19/11 -12/03/11   right breast   HYPERLIPIDEMIA    IRRITABLE BOWEL SYNDROME, HX OF    Left-sided carotid artery disease (HCC)    moderate left ICA stenosis   OSTEOARTHRITIS, HAND    PSVT (paroxysmal supraventricular tachycardia)    symptomatic on event monitor   Past Surgical History:  Procedure Laterality Date   ABDOMINAL HYSTERECTOMY     APPENDECTOMY     BREAST BIOPSY  11/14/10    r breast: inv, insitu mammary carcinoma w/calcif, er/pr +, her2 -   BREAST SURGERY     lumpectomy   CATARACT EXTRACTION     both eyes   ELECTROPHYSIOLOGIC STUDY N/A 05/10/2015   Procedure: SVT Ablation;  Surgeon: Will Meredith Leeds, MD;  Location: [place] [facility];  Service: Cardiovascular;  Laterality: N/A;   HERNIA REPAIR     inguinal herniorrhapy  1984   left   IR US GUIDE BX ASP/DRAIN  05/26/2019   rectal fissure repair     s/p benign breast biopsy  2003   right   s/p left foot surgury  2009   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   spiral fx left foot  2008   no surgury   TMJ ARTHROPLASTY   1989   TONGUE SURGERY     1988- to remove scar tissue growth    TONSILLECTOMY     Patient Active Problem List   Diagnosis Date Noted   Acute upper respiratory infection 05/16/2021   Nasal dryness 02/04/2021   Chronic sinusitis 11/25/2020   Allergic rhinitis 07/08/2020   Hypomagnesemia 06/10/2020   Goals of care, counseling/discussion 06/21/2019   Family history of breast cancer    Family history of pancreatic cancer    Family history of uterine cancer    Family history of lymphoma    Family history of lung cancer    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[place]) 06/02/2019   Recurrent cancer of left breast ([place]) 05/15/2019   Pes anserine bursitis 03/31/2019   HTN (hypertension) 02/10/2019   Hyperglycemia 06/21/2018   Left carpal tunnel syndrome 02/22/2018   Rotator cuff arthropathy of left shoulder 09/20/2017   Arthritis of hand 08/23/2017   Pain in right hand 08/23/2017   Cervical radiculitis 04/09/2017  Left shoulder pain 04/09/2017   Dyspnea on exertion 05/14/2016   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   Lymphedema of left arm 01/14/2016   Left arm swelling 12/25/2015   SVT (supraventricular tachycardia)    Left-sided carotid artery disease ([place]) 03/08/2015   Dizziness 01/24/2015   Urinary frequency 01/13/2015   Dizziness and giddiness 01/09/2015   Gallstones 02/21/2013   Malignant neoplasm of upper-inner quadrant of left breast in female, estrogen receptor positive ([place]) 11/28/2012   Muscle cramping 09/12/2012   Right hip pain 09/12/2012   Lower back pain 09/12/2012   COPD GOLD I     Hx of radiation therapy    Right shoulder pain 09/14/2011   Preventative health care 07/29/2010   Skin lesion of left leg 07/29/2010   Paresthesia 07/29/2010   GERD 03/28/2010   CONSTIPATION 03/28/2010   Osteoarthrosis, 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   Anxiety state 05/03/2009   Hyperlipidemia 06/14/2007   FATIGUE 06/14/2007   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  Rationale for Evaluation and Treatment Rehabilitation  SUBJECTIVE:                                                                                                                                                                                          SUBJECTIVE STATEMENT: The Arm Stacey [name] came in Friday like she said it would. No glove yet. Wrap stayed on well except had to cut left thumb off because it slid  PERTINENT HISTORY:  Hx breast cancer with 15 lymph node removal on the L and Bil lumpectomy. Lumpectomy on the L in 1997and the R in 2012. Radiation Bil.  New discovery of metastatic breast Cancer presently on Palbocidib and Anastrozole,  Fulvestrant since 06/2019.Recent PET scan on 10/09/2021 showed no evidence of recurrent or metastatic disease. Prior lymphedema treatment for Left UE in 2021  PAIN:  Are you having pain? No pain today NPRS scale: pt did not rate on scale Pain location: wrist and hand on left  Pain orientation: Left  PAIN TYPE: stabbing Pain description: intermittent  Aggravating factors: evening Relieving factors: Tribute wrap but doesn't wear the hand piece.  PRECAUTIONS: Other: Left UE lymphedema , Metastatic breast Cancer, COPD  WEIGHT BEARING RESTRICTIONS: No  FALLS:  Has  patient fallen in last 6 months? No  LIVING ENVIRONMENT: Lives with: lives alone Lives in: House/apartment Stairs: No; External: 1 steps; on right going up Has following equipment at home: None  OCCUPATION: retired  LEISURE: reading, puzzles, walks very little  HAND DOMINANCE: right   PRIOR LEVEL  OF FUNCTION: Independent  PATIENT GOALS: Try and getting swelling in left arm down. Get a new compression sleeve that fits properly   OBJECTIVE:  COGNITION: Overall cognitive status: Within functional limits for tasks assessed   PALPATION: Mild pitting edema ulnar border of Left UE  OBSERVATIONS / OTHER ASSESSMENTS: Bandaid on forearm with small amount of clear  drainage showing through. Other aspect of incision that can be viewed is healed. Sensation to light touch in the hand is intact. Dorsum of hand is slightly swollen, and upper arm swollen above where she had TG soft folded over and with bottom folded over several inches, may be making hand swell. She complains of left wrist pain worse at night and has a weak left grip  SENSATION: Light touch: Appears intact throughout left UE   POSTURE: forward head, rounded shoulders  UPPER EXTREMITY AROM/PROM:  A/PROM RIGHT   eval   Shoulder extension   Shoulder flexion   Shoulder abduction   Shoulder internal rotation   Shoulder external rotation     (Blank rows = not tested)  A/PROM LEFT   eval  Shoulder extension   Shoulder flexion   Shoulder abduction   Shoulder internal rotation   Shoulder external rotation     (Blank rows = not tested)  CERVICAL AROM: All within functional limits:   UPPER EXTREMITY STRENGTH: NT  LYMPHEDEMA ASSESSMENTS:   SURGERY TYPE/DATE: Left breast Lumpectomy 1997, Right lumpectomy 2012  NUMBER OF LYMPH NODES REMOVED: 15 on left UE  CHEMOTHERAPY:Yes, presently on medications for metastasis  RADIATION:bilateral   HORMONE TREATMENT: yes  INFECTIONS: yes  LYMPHEDEMA ASSESSMENTS:   LANDMARK RIGHT  eval  10 cm proximal to olecranon process 27.5  Olecranon process 22.4  10 cm proximal to ulnar styloid process 19.0  Just proximal to ulnar styloid process 15.2  Across hand at thumb web space 17.9  At base of 2nd digit 6.3  (Blank rows = not tested)  LANDMARK LEFT  eval  LEFT 12/18/2021 LEFT 12/24/2021 LEFT 12/29/21 LEFT 01/01/2022 LEFT 01/20/2022 LEFT 01/29/2022 LEFT 02/04/2022 LEFT 02/09/2022  10 cm proximal to olecranon process 28.2 29.0 28.6 28.5 28 28.3 28.0 29.0 29.0  Olecranon process 27.5 25.3 25.6 25.5 25.6 25.5 24.5 25.0 25  10 cm proximal to ulnar styloid process 24.0 21 21.6 21.8 21.3 21.3 20.6 21.0 20.5  Just proximal to ulnar styloid process 18.2 17.7 18.2 17.5 17.7 17.8 16.7 17.0 17.3  Across hand at thumb web space 18.5 18.0 18.3 17.5 17.7 17.6 17.7 17.8 17.7  At base of 2nd digit 6.5 6.3 6.3 6.4 6.3 6.35 6.2 6.2 6.2  (Blank rows = not tested)  Wrist   16.4     Elbow  26           axilla  29.2,     MCPS17.3    wrist 16.8  (SLEEVE MEASUREMENTS)    LLIS : 47%   TODAY'S TREATMENT:  DATE  02/16/2022 Wraps removed Manual lymph drainage in supine as follows: short neck, (no diaphragmatic breathing due to shortness of breath from medication) , left inguinal nodes, left axillo-inguinal anastamoses. Left upper extremity working proximal to distal then retracing all steps being very careful around area on forearm where pt recently had surgery. Compression bandaging: TG soft from hand to axilla, mollelast to digits 1-4 , artiflex from hand to axilla, 1 6cm bandage at hand, 1 8 cm bandage from wrist to axilla in herringbone, 1 12 cm bandage from hand to axilla with spiral..    02/11/2021 Had pt try putting her sleeve on the arm butler which she did with minimal difficulty Manual lymph drainage in supine as follows: short neck, (no diaphragmatic breathing due to shortness of breath from medication) , left inguinal nodes, left axillo-inguinal anastamoses. Left upper extremity working proximal to distal then retracing all steps being very careful around area on forearm where pt recently had surgery. Compression bandaging: TG soft from  hand to axilla, mollelast to digits 1-4 , artiflex from hand to axilla, 1 6cm bandage at hand, 1 8 cm bandage from wrist to axilla in herringbone, 1 12 cm bandage from hand to axilla with spiral.  02/09/2021 All wraps removed and pt remeasured. Slight irritation again noted above elbow but no skin break down Practiced donning sleeve several different ways to see how pt did the best. Tried Juzo foot slip eze over hand with pt pulling sleeve over it. Pt did fairly well initially but got the sleeve bunched up and was not able to get it any higher without help. She did well with the rubber gloves and mild assist but was not quite getting high enough without help. Removed sleeve and had pt try the arm butler. Therapist placed sleeve over butler and pt felt she would be able to do the same.  She did much better getting her arm into the sleeve and was only lacking getting it about an inch from the top. She was able to move it up with the rubber glove. When on correctly sleeve seemed to fit well. Pt had only received 2 Medi harmony gloves and we are not sure where they came from as we ordered an espirit glove. Demetrius Charity was too big and was not flat knit so removed pt sleeve and therapist wrapped pts arm: applied cocoa butter then Compression bandaging: TG soft from hand to axilla, mollelast to digits 1-4 , artiflex from hand to axilla, 1 6cm bandage at hand, 1 8 cm bandage from wrist to axilla in herringbone, 1 12 cm bandage from hand to axilla with spiral. Manus Gunning PT also looked at pts sleeve and invoice which showed that a medi espirit glove was ordered. Pt will check to see if she can find another glove at home. Not sure how she received 2 harmony gloves    02/04/2021 Arm remeasured. Slight irritation proximal to elbow where wrap was on for 6 days Manual lymph drainage in supine as follows: short neck, (no diaphragmatic breathing due to shortness of breath from medication) , left inguinal nodes, left  axillo-inguinal anastamoses. Left upper extremity working proximal to distal then retracing all steps being very careful around area on forearm where pt recently had surgery. Compression bandaging: TG soft from hand to axilla, mollelast to digits 1-4 , artiflex from hand to axilla, 1 6cm bandage at hand, 1 8 cm bandage from wrist to axilla in herringbone, 1 12 cm bandage from hand to  axilla with spiral.(Gave pt new wraps are hers are very stretched out.   01/27/2022 Manual lymph drainage in supine as follows: short neck, (no diaphragmatic breathing due to shortness of breath from medication) , left inguinal nodes, left axillo-inguinal anastamoses. Left upper extremity working proximal to distal then retracing all steps being very careful around area on forearm where pt recently had surgery. Compression bandaging: TG soft from hand to axilla, mollelast to digits 1-4 , artiflex from hand to axilla, 1 6cm bandage at hand, 1 8 cm bandage from wrist to axilla in herringbone, 1 12 cm bandage from hand to axilla with spiral.   01/29/2022 Wraps removed and pt remeasured Manual lymph drainage in supine as follows: short neck, (no diaphragmatic breathing due to shortness of breath from medication) , left inguinal nodes, left axillo-inguinal anastamoses. Left upper extremity working proximal to distal then retracing all steps being very careful around area on forearm where pt recently had surgery. Compression bandaging: TG soft from hand to axilla, mollelast to digits 1-4 , artiflex from hand to axilla, 1 6cm bandage at hand, 1 8 cm bandage from wrist to axilla in herringbone, 1 12 cm bandage from hand to axilla with spiral.   01/27/2022 Manual lymph drainage in supine as follows: short neck, (no diaphragmatic breathing due to shortness of breath from medication) , left inguinal nodes, left axillo-inguinal anastamoses. Left upper extremity working proximal to distal then retracing all steps being very careful  around area on forearm where pt recently had surgery. Compression bandaging: TG soft from hand to axilla, mollelast to digits 1-4 , artiflex from hand to axilla, 1 6cm bandage at hand, 1 8 cm bandage from wrist to axilla in herringbone, 1 12 cm bandage from hand to axilla with spiral.    01/20/2022 Manual lymph drainage in supine as follows: short neck, (no diaphragmatic breathing due to shortness of breath from medication) , left inguinal nodes, left axillo-inguinal anastamoses. Left upper extremity working proximal to distal then retracing all steps being very careful around area on forearm where pt recently had surgery. Pt remeasured with measurements remaining fairly stable Compression bandaging: TG soft from hand to axilla, mollelast to digits 1-4 covered with paper tape, artiflex from hand to axilla, 1 6cm bandage at hand, 1 8 cm bandage from wrist to axilla in herringbone, 1 12 cm bandage from hand to axilla with spiral.   01/15/2022 Manual lymph drainage in supine as follows: short neck, (no diaphragmatic breathing due to shortness of breath from medication) , left inguinal nodes, left axillo-inguinal anastamoses. Left upper extremity working proximal to distal then retracing all steps being very careful around area on forearm where pt recently had surgery. Compression bandaging: TG soft from hand to axilla, mollelast to digits 1-4 covered with paper tape, artiflex from hand to axilla, 1 6cm bandage at hand, 1 8 cm bandage from wrist to axilla in herringbone, 1 12 cm bandage from hand to axilla with spiral.    01/12/2022 Discussed compression sleeves and option for custom flat knit vs. Circular knit like Bella Strong. Pt has opted for custom flat knit and will be measured by Primitivo Gauze on Wed. Manual lymph drainage in supine as follows: short neck, (no diaphragmatic breathing due to shortness of breath from medication) , left inguinal nodes, left axillo-inguinal anastamoses. Left upper  extremity working proximal to distal then retracing all steps being very careful around area on forearm where pt recently had surgery. Compression bandaging: TG soft from hand to axilla, mollelast  to digits 1-4 covered with paper tape, artiflex from hand to axilla, 1 6cm bandage at hand, 1 8 cm bandage from wrist to axilla in herringbone, 1 12 cm bandage from hand to axilla with spiral.   01/08/2022 Pt brought all of her garments with her. Showed pt how to don Circaid Night garment using tabs on sleeve. She had some difficulty but when taking her time was able to get it on properly. She could not get power sleeve on and did not like it. Tried donning class 2 Medi Esprit day sleeve but pt was unable to get it over her wrist. Also tried EZ Slide and it was better, but pt was still not able to do independently and did not think she would be able to due to weakness in her right hand. Due to time running out told pt I will review other options for sleeve and will discuss with her next visit. She will return the [employer] sleeves. Compression bandaging: TG soft from hand to axilla, mollelast to digits 1-4 covered with paper tape, artiflex from hand to axilla, 1 6cm bandage at hand, 1 8 cm bandage from wrist to axilla in herringbone, 1 12 cm bandage from hand to axilla with spiral.  01/05/2022 Measured pt for CircAid Profile night time garment and she fit in to a size 2. Educated pt that it is important she has a well fitting nighttime garment to keep fluid from collecting in her arm overnight. Her current garment does not fit well and is too short. Assisted pt with ordering a Medi Mondi Espirit size 1 sleeve and glove for day time management.  Manual lymph drainage in supine as follows: short neck, (no diaphragmatic breathing due to shortness of breath from medication) , left inguinal nodes, left axillo-inguinal anastamoses. Left upper extremity working proximal to distal then retracing all steps being very  careful around area on forearm where pt recently had surgery. Compression bandaging: TG soft from hand to axilla, mollelast to digits 1-4 covered with paper tape, artiflex from hand to axilla, 1 6cm bandage at hand, 1 8 cm bandage from wrist to axilla in herringbone, 1 12 cm bandage from hand to axilla with spiral.  01/01/2022 Pt brought in old Juzo sleeve that is too big at the top. Class 1 circular knit Size IV. She does not have a handpiece. Also brought in her tribute night without the hand piece. Pt has tribute very loose so not really helping much, but she is not able to tighten the Velcro straps with it on. Sleeve is too short. Adjusted velcro straps to make slightly tighter. We may have an oversleeve in the back that would help her get it slightly snugger. Remeasured circumferences for PT note and compression sleeve/gauntlet. Manual lymph drainage in supine as follows: short neck, (no diaphragmatic breathing due to shortness of breath from medication) , left inguinal nodes, left axillo-inguinal anastamoses. Left upper extremity working proximal to distal then retracing all steps being very careful around area on forearm where pt recently had surgery. Compression bandaging: TG soft from hand to axilla, mollelast to digits 1-4 covered with paper tape, artiflex from hand to axilla, 1 6cm bandage at hand, 1 8 cm bandage from wrist to axilla in herringbone, 1 12 cm bandage from hand to axilla with spiral. Pt does not fit into a Juzo off the shelf . Searched garments after pt left. She does fit into Size 1 Medi Mondi Esprit sleeve/glove standard length, or a [employer]  size 2 glove, Exostrong sleeve small short May want to wait until January to consider a Circaid night Eval: Discussed POC and wrapping with pt. Pt is agreeable to try wrapping for 2 weeks to see if her arm would reduce. Swelling is soft and with compliance should reduce so she can be fit for a new sleeve. Discussed caution folding over TG  soft too much because of increasing the pressure possibly increasing swelling at hand and at upper arm.Marland Kitchen PATIENT EDUCATION:  Education details: discussed POC, bandaging, new garments, need for 3x/week, instruct family member for weekends if she would like. Person educated: Patient Education method: Explanation Education comprehension: verbalized understanding  HOME EXERCISE PROGRAM: None given today  ASSESSMENT:  CLINICAL IMPRESSION: Pt continues with good tolerance for wrapping while she awaits her new glove.  Will measure next visit and will continue PT until her new glove is received and we know pt is independent with self management. OBJECTIVE IMPAIRMENTS: decreased activity tolerance, decreased knowledge of condition, decreased ROM, increased edema, impaired UE functional use, postural dysfunction, and pain.   ACTIVITY LIMITATIONS: lifting, dressing, and reach over head  PARTICIPATION LIMITATIONS: cleaning, yard work, and reaching activities  PERSONAL FACTORS: 3+ comorbidities: Breast Cancer s/p radiation and with metastasis, COPD, OA, current cancer meds  are also affecting patient's functional outcome.   REHAB POTENTIAL: Good  CLINICAL DECISION MAKING: Stable/uncomplicated  EVALUATION COMPLEXITY: Low  GOALS: Goals reviewed with patient? Yes  SHORT TERM GOALS: Target date: 03/09/2022    Pt will be tolerant of compression bandaging Baseline: Goal status: MET  12/18/2021 2.  Pt will have reduction at olecranon and 10 cm prox to ulna by 1.5 cm Baseline: Goal status: MET 12/18/2021  LONG TERM GOALS: Target date: 03/30/2022    Pt will have decreased edema at olecranon and 10 cm prox to ulna styloid by 2.5 cm Baseline: 24.0 Goal status: MET 21.3 cm on 01/05/22  2.  Pt will be fit for appropriate compression sleeve Baseline:  Goal status: IN PROGRESS sleeve ordered 01/05/22 and night time garment and pt is awaiting arrival  3.  Pt will be independent with self care of  lymphedema including day and night compression Baseline:  Goal status: IN PROGRESS  4.  Pt will improve LLIS to no greater than 30% to demonstrate improved function  Baseline:  Goal status: IN PROGRESS    PLAN:  PT FREQUENCY: 2x/week  PT DURATION: 4 weeks  PLANNED INTERVENTIONS: Therapeutic exercises, Patient/Family education, Self Care, Orthotic/Fit training, Manual lymph drainage, Compression bandaging, and Manual therapy  PLAN FOR NEXT SESSION: Continue until pt receives her new day sleeve, Measure next visit,, how were bandages?, continue CDT. Awaiting new daytime sleeve, has already received Circaid Night  Claris Pong, PT 02/16/2022, 4:01 PM

## 2022-02-19 ENCOUNTER — Ambulatory Visit: Payer: Medicare Other

## 2022-02-19 DIAGNOSIS — I890 Lymphedema, not elsewhere classified: Secondary | ICD-10-CM

## 2022-02-19 DIAGNOSIS — C44629 Squamous cell carcinoma of skin of left upper limb, including shoulder: Secondary | ICD-10-CM | POA: Diagnosis not present

## 2022-02-19 DIAGNOSIS — C44619 Basal cell carcinoma of skin of left upper limb, including shoulder: Secondary | ICD-10-CM | POA: Diagnosis not present

## 2022-02-19 NOTE — Therapy (Signed)
OUTPATIENT PHYSICAL THERAPY ONCOLOGY TREATMENT  Patient Name: NASHEA CHUMNEY MRN: [id number] DOB:03-05-1940, 82 y.o., female Today's Date: 02/19/2022     PT End of Session - 02/19/22 1501     Visit Number 21    Number of Visits 25    Date for PT Re-Evaluation 03/04/22    PT Start Time 1503    Activity Tolerance Patient tolerated treatment well    Behavior During Therapy [facility] for tasks assessed/performed               Past Medical History:  Diagnosis Date   ANEMIA-NOS    ANXIETY    Breast cancer ([place]) 1997 L, 2012 R   s/p chemo/xrt   COPD    resolved   DIVERTICULOSIS, COLON 2008   Dizziness    Family history of breast cancer    Family history of lung cancer    Family history of lymphoma    Family history of pancreatic cancer    Family history of uterine cancer    GERD    Hx of radiation therapy 10/19/11 -12/03/11   right breast   HYPERLIPIDEMIA    IRRITABLE BOWEL SYNDROME, HX OF    Left-sided carotid artery disease (HCC)    moderate left ICA stenosis   OSTEOARTHRITIS, HAND    PSVT (paroxysmal supraventricular tachycardia)    symptomatic on event monitor   Past Surgical History:  Procedure Laterality Date   ABDOMINAL HYSTERECTOMY     APPENDECTOMY     BREAST BIOPSY  11/14/10    r breast: inv, insitu mammary carcinoma w/calcif, er/pr +, her2 -   BREAST SURGERY     lumpectomy   CATARACT EXTRACTION     both eyes   ELECTROPHYSIOLOGIC STUDY N/A 05/10/2015   Procedure: SVT Ablation;  Surgeon: Will Meredith Leeds, MD;  Location: [place] [facility];  Service: Cardiovascular;  Laterality: N/A;   HERNIA REPAIR     inguinal herniorrhapy  1984   left   IR US GUIDE BX ASP/DRAIN  05/26/2019   rectal fissure repair     s/p benign breast biopsy  2003   right   s/p left foot surgury  2009   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   spiral fx left foot  2008   no surgury   TMJ ARTHROPLASTY  1989   TONGUE SURGERY     1988- to remove scar tissue growth     TONSILLECTOMY     Patient Active Problem List   Diagnosis Date Noted   Acute upper respiratory infection 05/16/2021   Nasal dryness 02/04/2021   Chronic sinusitis 11/25/2020   Allergic rhinitis 07/08/2020   Hypomagnesemia 06/10/2020   Goals of care, counseling/discussion 06/21/2019   Family history of breast cancer    Family history of pancreatic cancer    Family history of uterine cancer    Family history of lymphoma    Family history of lung cancer    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[place]) 06/02/2019   Recurrent cancer of left breast ([place]) 05/15/2019   Pes anserine bursitis 03/31/2019   HTN (hypertension) 02/10/2019   Hyperglycemia 06/21/2018   Left carpal tunnel syndrome 02/22/2018   Rotator cuff arthropathy of left shoulder 09/20/2017   Arthritis of hand 08/23/2017   Pain in right hand 08/23/2017   Cervical radiculitis 04/09/2017   Left shoulder pain 04/09/2017   Dyspnea on exertion 05/14/2016   History of ductal  carcinoma in situ (DCIS) of breast 01/14/2016   Lymphedema of left arm 01/14/2016   Left arm swelling 12/25/2015   SVT (supraventricular tachycardia)    Left-sided carotid artery disease ([place]) 03/08/2015   Dizziness 01/24/2015   Urinary frequency 01/13/2015   Dizziness and giddiness 01/09/2015   Gallstones 02/21/2013   Malignant neoplasm of upper-inner quadrant of left breast in female, estrogen receptor positive ([place]) 11/28/2012   Muscle cramping 09/12/2012   Right hip pain 09/12/2012   Lower back pain 09/12/2012   COPD GOLD I     Hx of radiation therapy    Right shoulder pain 09/14/2011   Preventative health care 07/29/2010   Skin lesion of left leg 07/29/2010   Paresthesia 07/29/2010   GERD 03/28/2010   CONSTIPATION 03/28/2010   Osteoarthrosis, 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   Anxiety state 05/03/2009   Hyperlipidemia 06/14/2007   FATIGUE 06/14/2007   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  Rationale for Evaluation and Treatment Rehabilitation  SUBJECTIVE:                                                                                                                                                                                          SUBJECTIVE STATEMENT: I took the wrap off around 1:00 and I brought 2 sets of wraps in to roll. The glove has still not come in yet.  PERTINENT HISTORY:  Hx breast cancer with 15 lymph node removal on the L and Bil lumpectomy. Lumpectomy on the L in 1997and the R in 2012. Radiation Bil.  New discovery of metastatic breast Cancer presently on Palbocidib and Anastrozole,  Fulvestrant since 06/2019.Recent PET scan on 10/09/2021 showed no evidence of recurrent or metastatic disease. Prior lymphedema treatment for Left UE in 2021  PAIN:  Are you having pain? No pain today NPRS scale: pt did not rate on scale Pain location: wrist and hand on left  Pain orientation: Left  PAIN TYPE: stabbing Pain description: intermittent  Aggravating factors: evening Relieving factors: Tribute wrap but doesn't wear the hand piece.  PRECAUTIONS: Other: Left UE lymphedema , Metastatic breast Cancer, COPD  WEIGHT BEARING RESTRICTIONS: No  FALLS:  Has patient fallen in last 6 months? No  LIVING ENVIRONMENT: Lives with: lives alone Lives in: House/apartment Stairs:  No; External: 1 steps; on right going up Has following equipment at home: None  OCCUPATION: retired  LEISURE: reading, puzzles, walks very little  HAND DOMINANCE: right   PRIOR LEVEL OF FUNCTION: Independent  PATIENT GOALS: Try and getting swelling in left arm  down. Get a new compression sleeve that fits properly   OBJECTIVE:  COGNITION: Overall cognitive status: Within functional limits for tasks assessed   PALPATION: Mild pitting edema ulnar border of Left UE  OBSERVATIONS / OTHER ASSESSMENTS: Bandaid on forearm with small amount of clear  drainage showing through. Other aspect of incision that can be viewed is healed. Sensation to light touch in the hand is intact. Dorsum of hand is slightly swollen, and upper arm swollen above where she had TG soft folded over and with bottom folded over several inches, may be making hand swell. She complains of left wrist pain worse at night and has a weak left grip  SENSATION: Light touch: Appears intact throughout left UE   POSTURE: forward head, rounded shoulders  UPPER EXTREMITY AROM/PROM:  A/PROM RIGHT   eval   Shoulder extension   Shoulder flexion   Shoulder abduction   Shoulder internal rotation   Shoulder external rotation     (Blank rows = not tested)  A/PROM LEFT   eval  Shoulder extension   Shoulder flexion   Shoulder abduction   Shoulder internal rotation   Shoulder external rotation     (Blank rows = not tested)  CERVICAL AROM: All within functional limits:   UPPER EXTREMITY STRENGTH: NT  LYMPHEDEMA ASSESSMENTS:   SURGERY TYPE/DATE: Left breast Lumpectomy 1997, Right lumpectomy 2012  NUMBER OF LYMPH NODES REMOVED: 15 on left UE  CHEMOTHERAPY:Yes, presently on medications for metastasis  RADIATION:bilateral   HORMONE TREATMENT: yes  INFECTIONS: yes  LYMPHEDEMA ASSESSMENTS:   LANDMARK RIGHT  eval  10 cm proximal to olecranon process 27.5  Olecranon process 22.4  10 cm proximal to ulnar styloid process 19.0  Just proximal to ulnar styloid process 15.2  Across hand at thumb web space 17.9  At base of 2nd digit 6.3  (Blank rows = not tested)  LANDMARK LEFT  eval LEFT 12/18/2021 LEFT 12/24/2021 LEFT 12/29/21 LEFT 01/01/2022 LEFT 01/20/2022  LEFT 01/29/2022 LEFT 02/04/2022 LEFT 02/09/2022 LEFT 02/19/2022  10 cm proximal to olecranon process 28.2 29.0 28.6 28.5 28 28.3 28.0 29.0 29.0 29.2  Olecranon process 27.5 25.3 25.6 25.5 25.6 25.5 24.5 25.0 25 25.3  10 cm proximal to ulnar styloid process 24.0 21 21.6 21.8 21.3 21.3 20.6 21.0 20.5 20.5  Just proximal to ulnar styloid process 18.2 17.7 18.2 17.5 17.7 17.8 16.7 17.0 17.3 17.3  Across hand at thumb web space 18.5 18.0 18.3 17.5 17.7 17.6 17.7 17.8 17.7 17.6  At base of 2nd digit 6.5 6.3 6.3 6.4 6.3 6.35 6.2 6.2 6.2 6.2  (Blank rows = not tested)  Wrist   16.4     Elbow  26           axilla  29.2,     MCPS17.3    wrist 16.8  (SLEEVE MEASUREMENTS)    LLIS : 47%   TODAY'S TREATMENT:  DATE  02/19/2022  Pt re- measured and maintaining well while waiting for garment Manual lymph drainage in supine as follows: short neck, (no diaphragmatic breathing due to shortness of breath from medication) , left inguinal nodes, left axillo-inguinal anastamoses. Left upper extremity working proximal to distal then retracing all steps being very careful around area on forearm where pt recently had surgery. Compression bandaging: TG soft from hand to axilla, mollelast to digits 1-4 , artiflex from hand to axilla, 1 6cm bandage at hand, 1 8 cm bandage from wrist to axilla in herringbone, 1 12 cm bandage from hand to axilla with spiral..   02/16/2022 Wraps removed Manual lymph drainage in supine as follows: short neck, (no diaphragmatic breathing due to shortness of breath from medication) , left inguinal nodes, left axillo-inguinal anastamoses. Left upper extremity working proximal to distal then retracing all steps being very careful around area on forearm where pt recently had surgery. Compression bandaging: TG soft from hand to axilla, mollelast to digits 1-4 , artiflex from hand  to axilla, 1 6cm bandage at hand, 1 8 cm bandage from wrist to axilla in herringbone, 1 12 cm bandage from hand to axilla with spiral..    02/11/2021 Had pt try putting her sleeve on the arm butler which she did with minimal difficulty Manual lymph drainage in supine as follows: short neck, (no diaphragmatic breathing due to shortness of breath from medication) , left inguinal nodes, left axillo-inguinal anastamoses. Left upper extremity working proximal to distal then retracing all steps being very careful around area on forearm where pt recently had surgery. Compression bandaging: TG soft from hand to axilla, mollelast to digits 1-4 , artiflex from hand to axilla, 1 6cm bandage at hand, 1 8 cm bandage from wrist to axilla in herringbone, 1 12 cm bandage from hand to axilla with spiral.  02/09/2021 All wraps removed and pt remeasured. Slight irritation again noted above elbow but no skin break down Practiced donning sleeve several different ways to see how pt did the best. Tried Juzo foot slip eze over hand with pt pulling sleeve over it. Pt did fairly well initially but got the sleeve bunched up and was not able to get it any higher without help. She did well with the rubber gloves and mild assist but was not quite getting high enough without help. Removed sleeve and had pt try the arm butler. Therapist placed sleeve over butler and pt felt she would be able to do the same.  She did much better getting her arm into the sleeve and was only lacking getting it about an inch from the top. She was able to move it up with the rubber glove. When on correctly sleeve seemed to fit well. Pt had only received 2 Medi harmony gloves and we are not sure where they came from as we ordered an espirit glove. Demetrius Charity was too big and was not flat knit so removed pt sleeve and therapist wrapped pts arm: applied cocoa butter then Compression bandaging: TG soft from hand to axilla, mollelast to digits 1-4 , artiflex from hand  to axilla, 1 6cm bandage at hand, 1 8 cm bandage from wrist to axilla in herringbone, 1 12 cm bandage from hand to axilla with spiral. Manus Gunning PT also looked at pts sleeve and invoice which showed that a medi espirit glove was ordered. Pt will check to see if she can find another glove at home. Not sure how she received 2 harmony gloves  02/04/2021 Arm remeasured. Slight irritation proximal to elbow where wrap was on for 6 days Manual lymph drainage in supine as follows: short neck, (no diaphragmatic breathing due to shortness of breath from medication) , left inguinal nodes, left axillo-inguinal anastamoses. Left upper extremity working proximal to distal then retracing all steps being very careful around area on forearm where pt recently had surgery. Compression bandaging: TG soft from hand to axilla, mollelast to digits 1-4 , artiflex from hand to axilla, 1 6cm bandage at hand, 1 8 cm bandage from wrist to axilla in herringbone, 1 12 cm bandage from hand to axilla with spiral.(Gave pt new wraps are hers are very stretched out.   01/27/2022 Manual lymph drainage in supine as follows: short neck, (no diaphragmatic breathing due to shortness of breath from medication) , left inguinal nodes, left axillo-inguinal anastamoses. Left upper extremity working proximal to distal then retracing all steps being very careful around area on forearm where pt recently had surgery. Compression bandaging: TG soft from hand to axilla, mollelast to digits 1-4 , artiflex from hand to axilla, 1 6cm bandage at hand, 1 8 cm bandage from wrist to axilla in herringbone, 1 12 cm bandage from hand to axilla with spiral.   01/29/2022 Wraps removed and pt remeasured Manual lymph drainage in supine as follows: short neck, (no diaphragmatic breathing due to shortness of breath from medication) , left inguinal nodes, left axillo-inguinal anastamoses. Left upper extremity working proximal to distal then retracing all  steps being very careful around area on forearm where pt recently had surgery. Compression bandaging: TG soft from hand to axilla, mollelast to digits 1-4 , artiflex from hand to axilla, 1 6cm bandage at hand, 1 8 cm bandage from wrist to axilla in herringbone, 1 12 cm bandage from hand to axilla with spiral.   01/27/2022 Manual lymph drainage in supine as follows: short neck, (no diaphragmatic breathing due to shortness of breath from medication) , left inguinal nodes, left axillo-inguinal anastamoses. Left upper extremity working proximal to distal then retracing all steps being very careful around area on forearm where pt recently had surgery. Compression bandaging: TG soft from hand to axilla, mollelast to digits 1-4 , artiflex from hand to axilla, 1 6cm bandage at hand, 1 8 cm bandage from wrist to axilla in herringbone, 1 12 cm bandage from hand to axilla with spiral.     PATIENT EDUCATION:  Education details: discussed POC, bandaging, new garments, need for 3x/week, instruct family member for weekends if she would like. Person educated: Patient Education method: Explanation Education comprehension: verbalized understanding  HOME EXERCISE PROGRAM: None given today  ASSESSMENT:  CLINICAL IMPRESSION: Pt continues with good tolerance for wrapping while she awaits her new glove.  Measurements remain very stable.Will continue PT until her new glove is received and we know pt is independent with self management. OBJECTIVE IMPAIRMENTS: decreased activity tolerance, decreased knowledge of condition, decreased ROM, increased edema, impaired UE functional use, postural dysfunction, and pain.   ACTIVITY LIMITATIONS: lifting, dressing, and reach over head  PARTICIPATION LIMITATIONS: cleaning, yard work, and reaching activities  PERSONAL FACTORS: 3+ comorbidities: Breast Cancer s/p radiation and with metastasis, COPD, OA, current cancer meds  are also affecting patient's functional outcome.    REHAB POTENTIAL: Good  CLINICAL DECISION MAKING: Stable/uncomplicated  EVALUATION COMPLEXITY: Low  GOALS: Goals reviewed with patient? Yes  SHORT TERM GOALS: Target date: 03/12/2022    Pt will be tolerant of compression bandaging Baseline: Goal status: MET  12/18/2021 2.  Pt will have reduction at olecranon and 10 cm prox to ulna by 1.5 cm Baseline: Goal status: MET 12/18/2021  LONG TERM GOALS: Target date: 04/02/2022    Pt will have decreased edema at olecranon and 10 cm prox to ulna styloid by 2.5 cm Baseline: 24.0 Goal status: MET 21.3 cm on 01/05/22  2.  Pt will be fit for appropriate compression sleeve Baseline:  Goal status: MET has proper sleeve, awaiting glove  3.  Pt will be independent with self care of lymphedema including day and night compression Baseline:  Goal status: IN PROGRESS  4.  Pt will improve LLIS to no greater than 30% to demonstrate improved function  Baseline:  Goal status: IN PROGRESS    PLAN:  PT FREQUENCY: 2x/week  PT DURATION: 4 weeks  PLANNED INTERVENTIONS: Therapeutic exercises, Patient/Family education, Self Care, Orthotic/Fit training, Manual lymph drainage, Compression bandaging, and Manual therapy  PLAN FOR NEXT SESSION: Continue until pt receives her new day sleeve, Measure next visit,, how were bandages?, continue CDT. Awaiting new daytime sleeve, has already received Circaid Night  Claris Pong, PT 02/19/2022, 3:02 PM

## 2022-02-21 ENCOUNTER — Other Ambulatory Visit: Payer: Self-pay | Admitting: Internal Medicine

## 2022-02-21 NOTE — Telephone Encounter (Signed)
Please refill as per office routine med refill policy (all routine meds to be refilled for 3 mo or monthly (per pt preference) up to one year from last visit, then month to month grace period for 3 mo, then further med refills will have to be denied)

## 2022-02-23 ENCOUNTER — Other Ambulatory Visit: Payer: Self-pay

## 2022-02-23 ENCOUNTER — Ambulatory Visit: Payer: Medicare Other

## 2022-02-23 DIAGNOSIS — C44629 Squamous cell carcinoma of skin of left upper limb, including shoulder: Secondary | ICD-10-CM | POA: Diagnosis not present

## 2022-02-23 DIAGNOSIS — C44619 Basal cell carcinoma of skin of left upper limb, including shoulder: Secondary | ICD-10-CM | POA: Diagnosis not present

## 2022-02-23 DIAGNOSIS — I890 Lymphedema, not elsewhere classified: Secondary | ICD-10-CM

## 2022-02-23 NOTE — Therapy (Signed)
OUTPATIENT PHYSICAL THERAPY ONCOLOGY TREATMENT  Patient Name: WEDA BAUMGARNER MRN: [id number] DOB:Apr 21, 1940, 82 y.o., female Today's Date: 02/23/2022     PT End of Session - 02/23/22 1502     Visit Number 22    Number of Visits 25    Date for PT Re-Evaluation 03/04/22    PT Start Time 106    PT Stop Time 1550    PT Time Calculation (min) 47 min    Activity Tolerance Patient tolerated treatment well    Behavior During Therapy [facility] for tasks assessed/performed               Past Medical History:  Diagnosis Date   ANEMIA-NOS    ANXIETY    Breast cancer ([place]) 1997 L, 2012 R   s/p chemo/xrt   COPD    resolved   DIVERTICULOSIS, COLON 2008   Dizziness    Family history of breast cancer    Family history of lung cancer    Family history of lymphoma    Family history of pancreatic cancer    Family history of uterine cancer    GERD    Hx of radiation therapy 10/19/11 -12/03/11   right breast   HYPERLIPIDEMIA    IRRITABLE BOWEL SYNDROME, HX OF    Left-sided carotid artery disease (HCC)    moderate left ICA stenosis   OSTEOARTHRITIS, HAND    PSVT (paroxysmal supraventricular tachycardia)    symptomatic on event monitor   Past Surgical History:  Procedure Laterality Date   ABDOMINAL HYSTERECTOMY     APPENDECTOMY     BREAST BIOPSY  11/14/10    r breast: inv, insitu mammary carcinoma w/calcif, er/pr +, her2 -   BREAST SURGERY     lumpectomy   CATARACT EXTRACTION     both eyes   ELECTROPHYSIOLOGIC STUDY N/A 05/10/2015   Procedure: SVT Ablation;  Surgeon: Will Meredith Leeds, MD;  Location: [facility];  Service: Cardiovascular;  Laterality: N/A;   HERNIA REPAIR     inguinal herniorrhapy  1984   left   IR US GUIDE BX ASP/DRAIN  05/26/2019   rectal fissure repair     s/p benign breast biopsy  2003   right   s/p left foot surgury  2009   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   spiral fx left foot  2008   no surgury   TMJ ARTHROPLASTY   1989   TONGUE SURGERY     1988- to remove scar tissue growth    TONSILLECTOMY     Patient Active Problem List   Diagnosis Date Noted   Acute upper respiratory infection 05/16/2021   Nasal dryness 02/04/2021   Chronic sinusitis 11/25/2020   Allergic rhinitis 07/08/2020   Hypomagnesemia 06/10/2020   Goals of care, counseling/discussion 06/21/2019   Family history of breast cancer    Family history of pancreatic cancer    Family history of uterine cancer    Family history of lymphoma    Family history of lung cancer    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   Lymphedema of left arm 01/14/2016   Left arm swelling 12/25/2015   SVT (supraventricular tachycardia)    Left-sided carotid artery disease ([place]) 03/08/2015   Dizziness 01/24/2015   Urinary frequency 01/13/2015   Dizziness and giddiness 01/09/2015   Gallstones 02/21/2013   Malignant neoplasm of upper-inner quadrant of left breast in female, estrogen receptor positive ([place]) 11/28/2012   Muscle cramping 09/12/2012   Right hip pain 09/12/2012   Lower back pain 09/12/2012   COPD GOLD I     Hx of radiation therapy    Right shoulder pain 09/14/2011   Preventative health care 07/29/2010   Skin lesion of left leg 07/29/2010   Paresthesia 07/29/2010   GERD 03/28/2010   CONSTIPATION 03/28/2010   Osteoarthrosis, 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   Anxiety state 05/03/2009   Hyperlipidemia 06/14/2007   FATIGUE 06/14/2007   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  Rationale for Evaluation and Treatment Rehabilitation  SUBJECTIVE:                                                                                                                                                                                          SUBJECTIVE STATEMENT: I took the wraps off around 1;30 today.  I got a 3rd [employer] glove that came on Saturday instead of the Custom Glove. I don't know what is going on.  PERTINENT HISTORY:  Hx breast cancer with 15 lymph node removal on the L and Bil lumpectomy. Lumpectomy on the L in 1997and the R in 2012. Radiation Bil.  New discovery of metastatic breast Cancer presently on Palbocidib and Anastrozole,  Fulvestrant since 06/2019.Recent PET scan on 10/09/2021 showed no evidence of recurrent or metastatic disease. Prior lymphedema treatment for Left UE in 2021  PAIN:  Are you having pain? No pain today NPRS scale: pt did not rate on scale Pain location: wrist and hand on left  Pain orientation: Left  PAIN TYPE: stabbing Pain description: intermittent  Aggravating factors: evening Relieving factors: Tribute wrap but doesn't wear the hand piece.  PRECAUTIONS: Other: Left UE lymphedema , Metastatic breast Cancer, COPD  WEIGHT BEARING RESTRICTIONS: No  FALLS:  Has patient fallen in last 6 months? No  LIVING ENVIRONMENT: Lives with: lives alone Lives in: House/apartment Stairs: No; External: 1 steps; on right going up Has following equipment at home: None  OCCUPATION: retired  LEISURE: reading, puzzles, walks very little  HAND DOMINANCE: right    PRIOR LEVEL OF FUNCTION: Independent  PATIENT GOALS: Try and getting swelling in left arm down. Get a new compression sleeve that fits properly   OBJECTIVE:  COGNITION: Overall cognitive status: Within functional limits for tasks assessed   PALPATION: Mild pitting edema ulnar border of Left UE  OBSERVATIONS / OTHER ASSESSMENTS: Bandaid on forearm with small amount of clear  drainage showing through. Other aspect of incision that can be viewed is healed. Sensation to light touch in the hand is intact. Dorsum of hand is slightly swollen, and upper arm swollen above where she had TG soft folded over and with bottom folded over several inches, may be making hand swell. She complains of left wrist pain worse at night and has a weak left grip  SENSATION: Light touch: Appears intact throughout left UE   POSTURE: forward head, rounded shoulders  UPPER EXTREMITY AROM/PROM:  A/PROM RIGHT   eval   Shoulder extension   Shoulder flexion   Shoulder abduction   Shoulder internal rotation   Shoulder external rotation     (Blank rows = not tested)  A/PROM LEFT   eval  Shoulder extension   Shoulder flexion   Shoulder abduction   Shoulder internal rotation   Shoulder external rotation     (Blank rows = not tested)  CERVICAL AROM: All within functional limits:   UPPER EXTREMITY STRENGTH: NT  LYMPHEDEMA ASSESSMENTS:   SURGERY TYPE/DATE: Left breast Lumpectomy 1997, Right lumpectomy 2012  NUMBER OF LYMPH NODES REMOVED: 15 on left UE  CHEMOTHERAPY:Yes, presently on medications for metastasis  RADIATION:bilateral   HORMONE TREATMENT: yes  INFECTIONS: yes  LYMPHEDEMA ASSESSMENTS:   LANDMARK RIGHT  eval  10 cm proximal to olecranon process 27.5  Olecranon process 22.4  10 cm proximal to ulnar styloid process 19.0  Just proximal to ulnar styloid process 15.2  Across hand at thumb web space 17.9  At base of 2nd digit 6.3  (Blank rows = not tested)  LANDMARK LEFT  eval  LEFT 12/18/2021 LEFT 12/24/2021 LEFT 12/29/21 LEFT 01/01/2022 LEFT 01/20/2022 LEFT 01/29/2022 LEFT 02/04/2022 LEFT 02/09/2022 LEFT 02/19/2022  10 cm proximal to olecranon process 28.2 29.0 28.6 28.5 28 28.3 28.0 29.0 29.0 29.2  Olecranon process 27.5 25.3 25.6 25.5 25.6 25.5 24.5 25.0 25 25.3  10 cm proximal to ulnar styloid process 24.0 21 21.6 21.8 21.3 21.3 20.6 21.0 20.5 20.5  Just proximal to ulnar styloid process 18.2 17.7 18.2 17.5 17.7 17.8 16.7 17.0 17.3 17.3  Across hand at thumb web space 18.5 18.0 18.3 17.5 17.7 17.6 17.7 17.8 17.7 17.6  At base of 2nd digit 6.5 6.3 6.3 6.4 6.3 6.35 6.2 6.2 6.2 6.2  (Blank rows = not tested)  Wrist   16.4     Elbow  26           axilla  29.2,     MCPS17.3    wrist 16.8  (SLEEVE MEASUREMENTS)    LLIS : 47%   TODAY'S TREATMENT:  DATE  02/23/2022 Tried calling Romeo Rabon at [facility]. Pt has now received 3 [employer] incorrect gloves and has still not received her custom glove and pt has to continue PT until she receives it. Manual lymph drainage in supine as follows: short neck, (no diaphragmatic breathing due to shortness of breath from medication) , left inguinal nodes, left axillo-inguinal anastamoses. Left upper extremity working proximal to distal then retracing all steps being very careful around area on forearm where pt recently had surgery. Compression bandaging: TG soft from hand to axilla, mollelast to digits 1-4 , artiflex from hand to axilla, 1 6cm bandage at hand, 1 8 cm bandage from wrist to axilla in herringbone, 1 12 cm bandage from hand to axilla with spiral..    02/19/2022 Pt re- measured and maintaining well while waiting for garment Manual lymph drainage in supine as follows: short neck, (no diaphragmatic breathing due to shortness of breath from medication) , left inguinal nodes, left axillo-inguinal  anastamoses. Left upper extremity working proximal to distal then retracing all steps being very careful around area on forearm where pt recently had surgery. Compression bandaging: TG soft from hand to axilla, mollelast to digits 1-4 , artiflex from hand to axilla, 1 6cm bandage at hand, 1 8 cm bandage from wrist to axilla in herringbone, 1 12 cm bandage from hand to axilla with spiral..   02/16/2022 Wraps removed Manual lymph drainage in supine as follows: short neck, (no diaphragmatic breathing due to shortness of breath from medication) , left inguinal nodes, left axillo-inguinal anastamoses. Left upper extremity working proximal to distal then retracing all steps being very careful around area on forearm where pt recently had surgery. Compression bandaging: TG soft from hand to axilla, mollelast to digits 1-4 , artiflex from hand to axilla, 1 6cm bandage at hand, 1 8 cm bandage from wrist to axilla in herringbone, 1 12 cm bandage from hand to axilla with spiral..    02/11/2021 Had pt try putting her sleeve on the arm butler which she did with minimal difficulty Manual lymph drainage in supine as follows: short neck, (no diaphragmatic breathing due to shortness of breath from medication) , left inguinal nodes, left axillo-inguinal anastamoses. Left upper extremity working proximal to distal then retracing all steps being very careful around area on forearm where pt recently had surgery. Compression bandaging: TG soft from hand to axilla, mollelast to digits 1-4 , artiflex from hand to axilla, 1 6cm bandage at hand, 1 8 cm bandage from wrist to axilla in herringbone, 1 12 cm bandage from hand to axilla with spiral.  02/09/2021 All wraps removed and pt remeasured. Slight irritation again noted above elbow but no skin break down Practiced donning sleeve several different ways to see how pt did the best. Tried Juzo foot slip eze over hand with pt pulling sleeve over it. Pt did fairly well initially but  got the sleeve bunched up and was not able to get it any higher without help. She did well with the rubber gloves and mild assist but was not quite getting high enough without help. Removed sleeve and had pt try the arm butler. Therapist placed sleeve over butler and pt felt she would be able to do the same.  She did much better getting her arm into the sleeve and was only lacking getting it about an inch from the top. She was able to move it up with the rubber glove. When on correctly sleeve seemed to fit well. Pt had  only received 2 Medi harmony gloves and we are not sure where they came from as we ordered an espirit glove. Demetrius Charity was too big and was not flat knit so removed pt sleeve and therapist wrapped pts arm: applied cocoa butter then Compression bandaging: TG soft from hand to axilla, mollelast to digits 1-4 , artiflex from hand to axilla, 1 6cm bandage at hand, 1 8 cm bandage from wrist to axilla in herringbone, 1 12 cm bandage from hand to axilla with spiral. Manus Gunning PT also looked at pts sleeve and invoice which showed that a medi espirit glove was ordered. Pt will check to see if she can find another glove at home. Not sure how she received 2 harmony gloves    02/04/2021 Arm remeasured. Slight irritation proximal to elbow where wrap was on for 6 days Manual lymph drainage in supine as follows: short neck, (no diaphragmatic breathing due to shortness of breath from medication) , left inguinal nodes, left axillo-inguinal anastamoses. Left upper extremity working proximal to distal then retracing all steps being very careful around area on forearm where pt recently had surgery. Compression bandaging: TG soft from hand to axilla, mollelast to digits 1-4 , artiflex from hand to axilla, 1 6cm bandage at hand, 1 8 cm bandage from wrist to axilla in herringbone, 1 12 cm bandage from hand to axilla with spiral.(Gave pt new wraps are hers are very stretched out.   01/27/2022 Manual lymph  drainage in supine as follows: short neck, (no diaphragmatic breathing due to shortness of breath from medication) , left inguinal nodes, left axillo-inguinal anastamoses. Left upper extremity working proximal to distal then retracing all steps being very careful around area on forearm where pt recently had surgery. Compression bandaging: TG soft from hand to axilla, mollelast to digits 1-4 , artiflex from hand to axilla, 1 6cm bandage at hand, 1 8 cm bandage from wrist to axilla in herringbone, 1 12 cm bandage from hand to axilla with spiral.   01/29/2022 Wraps removed and pt remeasured Manual lymph drainage in supine as follows: short neck, (no diaphragmatic breathing due to shortness of breath from medication) , left inguinal nodes, left axillo-inguinal anastamoses. Left upper extremity working proximal to distal then retracing all steps being very careful around area on forearm where pt recently had surgery. Compression bandaging: TG soft from hand to axilla, mollelast to digits 1-4 , artiflex from hand to axilla, 1 6cm bandage at hand, 1 8 cm bandage from wrist to axilla in herringbone, 1 12 cm bandage from hand to axilla with spiral.   01/27/2022 Manual lymph drainage in supine as follows: short neck, (no diaphragmatic breathing due to shortness of breath from medication) , left inguinal nodes, left axillo-inguinal anastamoses. Left upper extremity working proximal to distal then retracing all steps being very careful around area on forearm where pt recently had surgery. Compression bandaging: TG soft from hand to axilla, mollelast to digits 1-4 , artiflex from hand to axilla, 1 6cm bandage at hand, 1 8 cm bandage from wrist to axilla in herringbone, 1 12 cm bandage from hand to axilla with spiral.     PATIENT EDUCATION:  Education details: discussed POC, bandaging, new garments, need for 3x/week, instruct family member for weekends if she would like. Person educated: Patient Education  method: Explanation Education comprehension: verbalized understanding  HOME EXERCISE PROGRAM: None given today  ASSESSMENT:  CLINICAL IMPRESSION: Pt and therapist very frustrated that her correct custom fit Medi glove has not  yet arrived, and instead they keep sending a [employer] in its place. Tried calling Romeo Rabon today and left a lengthy message and will also email. Pt continues to require skilled therapy until her custom glove comes in.  She has everything else that she needs to be independent. OBJECTIVE IMPAIRMENTS: decreased activity tolerance, decreased knowledge of condition, decreased ROM, increased edema, impaired UE functional use, postural dysfunction, and pain.   ACTIVITY LIMITATIONS: lifting, dressing, and reach over head  PARTICIPATION LIMITATIONS: cleaning, yard work, and reaching activities  PERSONAL FACTORS: 3+ comorbidities: Breast Cancer s/p radiation and with metastasis, COPD, OA, current cancer meds  are also affecting patient's functional outcome.   REHAB POTENTIAL: Good  CLINICAL DECISION MAKING: Stable/uncomplicated  EVALUATION COMPLEXITY: Low  GOALS: Goals reviewed with patient? Yes  SHORT TERM GOALS: Target date: 03/16/2022    Pt will be tolerant of compression bandaging Baseline: Goal status: MET  12/18/2021 2.  Pt will have reduction at olecranon and 10 cm prox to ulna by 1.5 cm Baseline: Goal status: MET 12/18/2021  LONG TERM GOALS: Target date: 04/06/2022    Pt will have decreased edema at olecranon and 10 cm prox to ulna styloid by 2.5 cm Baseline: 24.0 Goal status: MET 21.3 cm on 01/05/22  2.  Pt will be fit for appropriate compression sleeve Baseline:  Goal status: MET has proper sleeve, awaiting glove  3.  Pt will be independent with self care of lymphedema including day and night compression Baseline:  Goal status: IN PROGRESS  4.  Pt will improve LLIS to no greater than 30% to demonstrate improved function  Baseline:  Goal  status: IN PROGRESS    PLAN:  PT FREQUENCY: 2x/week  PT DURATION: 4 weeks  PLANNED INTERVENTIONS: Therapeutic exercises, Patient/Family education, Self Care, Orthotic/Fit training, Manual lymph drainage, Compression bandaging, and Manual therapy  PLAN FOR NEXT SESSION: Called [facility] and awaiting a call back,Continue until pt receives her new day glove customMeasure next visit,, how were bandages?, continue CDT. Awaiting new daytime glove, has already received [employer] Night  Claris Pong, PT 02/23/2022, 4:01 PM

## 2022-02-24 ENCOUNTER — Telehealth: Payer: Self-pay | Admitting: Hematology and Oncology

## 2022-02-24 NOTE — Telephone Encounter (Signed)
Contacted patient to scheduled appointments. Left message with appointment details and a call back number if patient had any questions or could not accommodate the time we provided.

## 2022-02-26 ENCOUNTER — Ambulatory Visit: Payer: Medicare Other

## 2022-02-26 DIAGNOSIS — C44619 Basal cell carcinoma of skin of left upper limb, including shoulder: Secondary | ICD-10-CM

## 2022-02-26 DIAGNOSIS — I890 Lymphedema, not elsewhere classified: Secondary | ICD-10-CM | POA: Diagnosis not present

## 2022-02-26 DIAGNOSIS — C44629 Squamous cell carcinoma of skin of left upper limb, including shoulder: Secondary | ICD-10-CM

## 2022-02-26 NOTE — Therapy (Signed)
OUTPATIENT PHYSICAL THERAPY ONCOLOGY TREATMENT  Patient Name: Stacey [name] MRN: [id number] DOB:[birth year], 82 y.o., female Today's Date: 02/26/2022     PT End of Session - 02/26/22 1500     Visit Number 23    Number of Visits 25    Date for PT Re-Evaluation 03/04/22    PT Start Time 1502    PT Stop Time 5462    PT Time Calculation (min) 54 min    Activity Tolerance Patient tolerated treatment well    Behavior During Therapy [facility] for tasks assessed/performed               Past Medical History:  Diagnosis Date   ANEMIA-NOS    ANXIETY    Breast cancer ([place]) 1997 L, 2012 R   s/p chemo/xrt   COPD    resolved   DIVERTICULOSIS, COLON 2008   Dizziness    Family history of breast cancer    Family history of lung cancer    Family history of lymphoma    Family history of pancreatic cancer    Family history of uterine cancer    GERD    Hx of radiation therapy 10/19/11 -12/03/11   right breast   HYPERLIPIDEMIA    IRRITABLE BOWEL SYNDROME, HX OF    Left-sided carotid artery disease (HCC)    moderate left ICA stenosis   OSTEOARTHRITIS, HAND    PSVT (paroxysmal supraventricular tachycardia)    symptomatic on event monitor   Past Surgical History:  Procedure Laterality Date   ABDOMINAL HYSTERECTOMY     APPENDECTOMY     BREAST BIOPSY  11/14/10    r breast: inv, insitu mammary carcinoma w/calcif, er/pr +, her2 -   BREAST SURGERY     lumpectomy   CATARACT EXTRACTION     both eyes   ELECTROPHYSIOLOGIC STUDY N/A 05/10/2015   Procedure: SVT Ablation;  Surgeon: Will Meredith Leeds, MD;  Location: [facility];  Service: Cardiovascular;  Laterality: N/A;   HERNIA REPAIR     inguinal herniorrhapy  1984   left   IR US GUIDE BX ASP/DRAIN  05/26/2019   rectal fissure repair     s/p benign breast biopsy  2003   right   s/p left foot surgury  2009   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   spiral fx left foot  2008   no surgury   TMJ ARTHROPLASTY   1989   TONGUE SURGERY     1988- to remove scar tissue growth    TONSILLECTOMY     Patient Active Problem List   Diagnosis Date Noted   Acute upper respiratory infection 05/16/2021   Nasal dryness 02/04/2021   Chronic sinusitis 11/25/2020   Allergic rhinitis 07/08/2020   Hypomagnesemia 06/10/2020   Goals of care, counseling/discussion 06/21/2019   Family history of breast cancer    Family history of pancreatic cancer    Family history of uterine cancer    Family history of lymphoma    Family history of lung cancer    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[place]) 06/02/2019   Recurrent cancer of left breast ([place]) 05/15/2019   Pes anserine bursitis 03/31/2019   HTN (hypertension) 02/10/2019   Hyperglycemia 06/21/2018   Left carpal tunnel syndrome 02/22/2018   Rotator cuff arthropathy of left shoulder 09/20/2017   Arthritis of hand 08/23/2017   Pain in right hand 08/23/2017   Cervical radiculitis 04/09/2017  Left shoulder pain 04/09/2017   Dyspnea on exertion 05/14/2016   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   Lymphedema of left arm 01/14/2016   Left arm swelling 12/25/2015   SVT (supraventricular tachycardia)    Left-sided carotid artery disease ([place]) 03/08/2015   Dizziness 01/24/2015   Urinary frequency 01/13/2015   Dizziness and giddiness 01/09/2015   Gallstones 02/21/2013   Malignant neoplasm of upper-inner quadrant of left breast in female, estrogen receptor positive ([place]) 11/28/2012   Muscle cramping 09/12/2012   Right hip pain 09/12/2012   Lower back pain 09/12/2012   COPD GOLD I     Hx of radiation therapy    Right shoulder pain 09/14/2011   Preventative health care 07/29/2010   Skin lesion of left leg 07/29/2010   Paresthesia 07/29/2010   GERD 03/28/2010   CONSTIPATION 03/28/2010   Osteoarthrosis, 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   Anxiety state 05/03/2009   Hyperlipidemia 06/14/2007   FATIGUE 06/14/2007   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  Rationale for Evaluation and Treatment Rehabilitation  SUBJECTIVE:                                                                                                                                                                                          SUBJECTIVE STATEMENT: Pt came in wrapped. She reported she has not yet received her custom glove or had a call back from [facility] where it was ordered from.She expresses frustration at having to remain in the wraps for so long and to continue to have to come for therapy appts.  PERTINENT HISTORY:  Hx breast cancer with 15 lymph node removal on the L and Bil lumpectomy. Lumpectomy on the L in 1997and the R in 2012. Radiation Bil.  New discovery of metastatic breast Cancer presently on Palbocidib and Anastrozole,  Fulvestrant since 06/2019.Recent PET scan on 10/09/2021 showed no evidence of recurrent or metastatic disease. Prior lymphedema treatment for Left UE in 2021  PAIN:  Are you having pain? No pain today NPRS scale: pt did not rate on scale Pain location: wrist and hand on left  Pain orientation: Left  PAIN TYPE: stabbing Pain description: intermittent  Aggravating factors: evening Relieving factors: Tribute wrap but doesn't wear the hand piece.  PRECAUTIONS: Other: Left UE lymphedema , Metastatic breast Cancer, COPD  WEIGHT BEARING RESTRICTIONS: No  FALLS:  Has patient fallen in last 6 months? No  LIVING ENVIRONMENT: Lives with: lives alone Lives in: House/apartment Stairs: No; External: 1 steps; on right going up Has following equipment at home:  None  OCCUPATION: retired  LEISURE: reading, puzzles, walks very little  HAND DOMINANCE: right   PRIOR LEVEL OF FUNCTION: Independent  PATIENT GOALS: Try and getting swelling in left arm down. Get a new compression sleeve that fits properly   OBJECTIVE:  COGNITION: Overall cognitive status: Within functional limits for tasks assessed   PALPATION: Mild pitting edema ulnar border of Left UE  OBSERVATIONS / OTHER ASSESSMENTS: Bandaid on forearm with small amount of clear  drainage showing through. Other aspect of incision that can be viewed is healed. Sensation to light touch in the hand is intact. Dorsum of hand is slightly swollen, and upper arm swollen above where she had TG soft folded over and with bottom folded over several inches, may be making hand swell. She complains of left wrist pain worse at night and has a weak left grip  SENSATION: Light touch: Appears intact throughout left UE   POSTURE: forward head, rounded shoulders  CERVICAL AROM: All within functional limits:   UPPER EXTREMITY STRENGTH: NT  LYMPHEDEMA ASSESSMENTS:   SURGERY TYPE/DATE: Left breast Lumpectomy 1997, Right lumpectomy 2012  NUMBER OF LYMPH NODES REMOVED: 15 on left UE  CHEMOTHERAPY:Yes, presently on medications for metastasis  RADIATION:bilateral   HORMONE TREATMENT: yes  INFECTIONS: yes  LYMPHEDEMA ASSESSMENTS:   LANDMARK RIGHT  eval  10 cm proximal to olecranon process 27.5  Olecranon process 22.4  10 cm proximal to ulnar styloid process 19.0  Just proximal to ulnar styloid process 15.2  Across hand at thumb web space 17.9  At base of 2nd digit 6.3  (Blank rows = not tested)  LANDMARK LEFT  eval LEFT 12/18/2021 LEFT 12/24/2021 LEFT 12/29/21 LEFT 01/01/2022 LEFT 01/20/2022 LEFT 01/29/2022 LEFT 02/04/2022 LEFT 02/09/2022 LEFT 02/19/2022  10 cm proximal to olecranon process 28.2 29.0 28.6 28.5 28 28.3 28.0 29.0 29.0 29.2  Olecranon process 27.5 25.3 25.6 25.5 25.6 25.5 24.5  25.0 25 25.3  10 cm proximal to ulnar styloid process 24.0 21 21.6 21.8 21.3 21.3 20.6 21.0 20.5 20.5  Just proximal to ulnar styloid process 18.2 17.7 18.2 17.5 17.7 17.8 16.7 17.0 17.3 17.3  Across hand at thumb web space 18.5 18.0 18.3 17.5 17.7 17.6 17.7 17.8 17.7 17.6  At base of 2nd digit 6.5 6.3 6.3 6.4 6.3 6.35 6.2 6.2 6.2 6.2  (Blank rows = not tested)  Wrist   16.4     Elbow  26           axilla  29.2,     MCPS17.3    wrist 16.8  (SLEEVE MEASUREMENTS)    LLIS : 47%   TODAY'S TREATMENT:  DATE  02/26/2022 Called Romeo Rabon. [facility]Custom glove has not been ordered. Large volume of Medicare orders has slowed things down.  Custom glove will be 305.98 and medicare will pay for. They will send return label to pt. By mail to return 3 medi harmony gloves which she received for unknown reasons. Wraps removed and lotion applied Manual lymph drainage in supine as follows: short neck, (no diaphragmatic breathing due to shortness of breath from medication) , left inguinal nodes, left axillo-inguinal anastamoses. Left upper extremity working proximal to distal then retracing all steps being very careful around area on forearm where pt recently had surgery. Compression bandaging: TG soft from hand to axilla, mollelast to digits 1-4 , artiflex from hand to axilla, 1 6cm bandage at hand, 1 8 cm bandage from wrist to axilla in herringbone, 1 12 cm bandage from hand to axilla with spiral..  02/23/2022 Tried calling Romeo Rabon at [place]. Pt has now received 3 Medi Harmony incorrect gloves and has still not received her custom glove and pt has to continue PT until she receives it. Manual lymph drainage in supine as follows: short neck, (no diaphragmatic breathing due to shortness of breath from medication) , left inguinal nodes, left axillo-inguinal anastamoses. Left upper extremity  working proximal to distal then retracing all steps being very careful around area on forearm where pt recently had surgery. Compression bandaging: TG soft from hand to axilla, mollelast to digits 1-4 , artiflex from hand to axilla, 1 6cm bandage at hand, 1 8 cm bandage from wrist to axilla in herringbone, 1 12 cm bandage from hand to axilla with spiral..    02/19/2022 Pt re- measured and maintaining well while waiting for garment Manual lymph drainage in supine as follows: short neck, (no diaphragmatic breathing due to shortness of breath from medication) , left inguinal nodes, left axillo-inguinal anastamoses. Left upper extremity working proximal to distal then retracing all steps being very careful around area on forearm where pt recently had surgery. Compression bandaging: TG soft from hand to axilla, mollelast to digits 1-4 , artiflex from hand to axilla, 1 6cm bandage at hand, 1 8 cm bandage from wrist to axilla in herringbone, 1 12 cm bandage from hand to axilla with spiral..   02/16/2022 Wraps removed Manual lymph drainage in supine as follows: short neck, (no diaphragmatic breathing due to shortness of breath from medication) , left inguinal nodes, left axillo-inguinal anastamoses. Left upper extremity working proximal to distal then retracing all steps being very careful around area on forearm where pt recently had surgery. Compression bandaging: TG soft from hand to axilla, mollelast to digits 1-4 , artiflex from hand to axilla, 1 6cm bandage at hand, 1 8 cm bandage from wrist to axilla in herringbone, 1 12 cm bandage from hand to axilla with spiral..    02/11/2021 Had pt try putting her sleeve on the arm butler which she did with minimal difficulty Manual lymph drainage in supine as follows: short neck, (no diaphragmatic breathing due to shortness of breath from medication) , left inguinal nodes, left axillo-inguinal anastamoses. Left upper extremity working proximal to distal then  retracing all steps being very careful around area on forearm where pt recently had surgery. Compression bandaging: TG soft from hand to axilla, mollelast to digits 1-4 , artiflex from hand to axilla, 1 6cm bandage at hand, 1 8 cm bandage from wrist to axilla in herringbone, 1 12 cm bandage from hand to axilla with spiral.  02/09/2021 All wraps removed  and pt remeasured. Slight irritation again noted above elbow but no skin break down Practiced donning sleeve several different ways to see how pt did the best. Tried Juzo foot slip eze over hand with pt pulling sleeve over it. Pt did fairly well initially but got the sleeve bunched up and was not able to get it any higher without help. She did well with the rubber gloves and mild assist but was not quite getting high enough without help. Removed sleeve and had pt try the arm butler. Therapist placed sleeve over butler and pt felt she would be able to do the same.  She did much better getting her arm into the sleeve and was only lacking getting it about an inch from the top. She was able to move it up with the rubber glove. When on correctly sleeve seemed to fit well. Pt had only received 2 Medi harmony gloves and we are not sure where they came from as we ordered an espirit glove. Demetrius Charity was too big and was not flat knit so removed pt sleeve and therapist wrapped pts arm: applied cocoa butter then Compression bandaging: TG soft from hand to axilla, mollelast to digits 1-4 , artiflex from hand to axilla, 1 6cm bandage at hand, 1 8 cm bandage from wrist to axilla in herringbone, 1 12 cm bandage from hand to axilla with spiral. Manus Gunning PT also looked at pts sleeve and invoice which showed that a medi espirit glove was ordered. Pt will check to see if she can find another glove at home. Not sure how she received 2 harmony gloves        PATIENT EDUCATION:  Education details: discussed POC, bandaging, new garments, need for 3x/week, instruct  family member for weekends if she would like. Person educated: Patient Education method: Explanation Education comprehension: verbalized understanding  HOME EXERCISE PROGRAM: None given today  ASSESSMENT:  CLINICAL IMPRESSION: Pt continues to be frustrated with the length of time she has had to wait to get her custom glove. Pt requires custom garments as her arm is large  for a ready made garment and she requires a flat knit sleeve to contain it. She does not fit into a ready made sleeve or glove. Garment fitting has been complicated by her decreased bilateral hand strength making donning garments challenging. She did receive her custom sleeve several weeks ago and was able to don it with the arm [name]. She also has her night time Circaid. Unfortunately, she is not able to be released to use these items independently as she is awaiting her custom glove. She requires skilled therapy to continue to perform MLD and bandaging until she can get into her custom garments as she is unable to bandage herself due to decreased strength. Her custom glove has been delayed due to a huge increase in volume of garments being ordered since January first. She has had very good reduction overall and is pleased with her progress, but frustrated to have to continue outpatient PT until garments are received.  OBJECTIVE IMPAIRMENTS: decreased activity tolerance, decreased knowledge of condition, decreased ROM, increased edema, impaired UE functional use, postural dysfunction, and pain.   ACTIVITY LIMITATIONS: lifting, dressing, and reach over head  PARTICIPATION LIMITATIONS: cleaning, yard work, and reaching activities  PERSONAL FACTORS: 3+ comorbidities: Breast Cancer s/p radiation and with metastasis, COPD, OA, current cancer meds  are also affecting patient's functional outcome.   REHAB POTENTIAL: Good  CLINICAL DECISION MAKING: Stable/uncomplicated  EVALUATION COMPLEXITY: Low  GOALS: Goals reviewed with  patient? Yes  SHORT TERM GOALS: Target date: 03/19/2022    Pt will be tolerant of compression bandaging Baseline: Goal status: MET  12/18/2021 2.  Pt will have reduction at olecranon and 10 cm prox to ulna by 1.5 cm Baseline: Goal status: MET 12/18/2021  LONG TERM GOALS: Target date: 04/09/2022    Pt will have decreased edema at olecranon and 10 cm prox to ulna styloid by 2.5 cm Baseline: 24.0 Goal status: MET 21.3 cm on 01/05/22  2.  Pt will be fit for appropriate compression sleeve Baseline:  Goal status: MET has proper sleeve, awaiting glove  3.  Pt will be independent with self care of lymphedema including day and night compression Baseline:  Goal status: IN PROGRESS( has day sleeve, and night circaid, awaiting glove)  4.  Pt will improve LLIS to no greater than 30% to demonstrate improved function  Baseline:  Goal status: IN PROGRESS    PLAN:  PT FREQUENCY: 2x/week  PT DURATION: 4 weeks  PLANNED INTERVENTIONS: Therapeutic exercises, Patient/Family education, Self Care, Orthotic/Fit training, Manual lymph drainage, Compression bandaging, and Manual therapy  PLAN FOR NEXT SESSION: Continue until pt receives her new  custom day glove. ,  continue CDT until pt receives all garments due to unable to wrap herself.Awaiting new daytime glove, has already received Circaid Night  Claris Pong, PT 02/26/2022, 3:59 PM

## 2022-03-02 ENCOUNTER — Ambulatory Visit: Payer: Medicare Other

## 2022-03-02 DIAGNOSIS — C44629 Squamous cell carcinoma of skin of left upper limb, including shoulder: Secondary | ICD-10-CM | POA: Diagnosis not present

## 2022-03-02 DIAGNOSIS — C44619 Basal cell carcinoma of skin of left upper limb, including shoulder: Secondary | ICD-10-CM

## 2022-03-02 DIAGNOSIS — I890 Lymphedema, not elsewhere classified: Secondary | ICD-10-CM | POA: Diagnosis not present

## 2022-03-02 NOTE — Therapy (Signed)
OUTPATIENT PHYSICAL THERAPY ONCOLOGY TREATMENT  Patient Name: Stacey [name] MRN: [id number] DOB:30-Jan-1941, 82 y.o., female Today's Date: 03/02/2022     PT End of Session - 03/02/22 1556     Visit Number 24    Number of Visits 25    Date for PT Re-Evaluation 03/04/22    PT Start Time 1556    PT Stop Time 1700    PT Time Calculation (min) 64 min    Activity Tolerance Patient tolerated treatment well    Behavior During Therapy [facility] for tasks assessed/performed               Past Medical History:  Diagnosis Date   ANEMIA-NOS    ANXIETY    Breast cancer ([place]) 1997 L, 2012 R   s/p chemo/xrt   COPD    resolved   DIVERTICULOSIS, COLON 2008   Dizziness    Family history of breast cancer    Family history of lung cancer    Family history of lymphoma    Family history of pancreatic cancer    Family history of uterine cancer    GERD    Hx of radiation therapy 10/19/11 -12/03/11   right breast   HYPERLIPIDEMIA    IRRITABLE BOWEL SYNDROME, HX OF    Left-sided carotid artery disease (HCC)    moderate left ICA stenosis   OSTEOARTHRITIS, HAND    PSVT (paroxysmal supraventricular tachycardia)    symptomatic on event monitor   Past Surgical History:  Procedure Laterality Date   ABDOMINAL HYSTERECTOMY     APPENDECTOMY     BREAST BIOPSY  11/14/10    r breast: inv, insitu mammary carcinoma w/calcif, er/pr +, her2 -   BREAST SURGERY     lumpectomy   CATARACT EXTRACTION     both eyes   ELECTROPHYSIOLOGIC STUDY N/A 05/10/2015   Procedure: SVT Ablation;  Surgeon: Will Meredith Leeds, MD;  Location: [place] [facility];  Service: Cardiovascular;  Laterality: N/A;   HERNIA REPAIR     inguinal herniorrhapy  1984   left   IR US GUIDE BX ASP/DRAIN  05/26/2019   rectal fissure repair     s/p benign breast biopsy  2003   right   s/p left foot surgury  2009   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   spiral fx left foot  2008   no surgury   TMJ ARTHROPLASTY   1989   TONGUE SURGERY     1988- to remove scar tissue growth    TONSILLECTOMY     Patient Active Problem List   Diagnosis Date Noted   Acute upper respiratory infection 05/16/2021   Nasal dryness 02/04/2021   Chronic sinusitis 11/25/2020   Allergic rhinitis 07/08/2020   Hypomagnesemia 06/10/2020   Goals of care, counseling/discussion 06/21/2019   Family history of breast cancer    Family history of pancreatic cancer    Family history of uterine cancer    Family history of lymphoma    Family history of lung cancer    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[place]) 06/02/2019   Recurrent cancer of left breast ([place]) 05/15/2019   Pes anserine bursitis 03/31/2019   HTN (hypertension) 02/10/2019   Hyperglycemia 06/21/2018   Left carpal tunnel syndrome 02/22/2018   Rotator cuff arthropathy of left shoulder 09/20/2017   Arthritis of hand 08/23/2017   Pain in right hand 08/23/2017   Cervical radiculitis 04/09/2017  Left shoulder pain 04/09/2017   Dyspnea on exertion 05/14/2016   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   Lymphedema of left arm 01/14/2016   Left arm swelling 12/25/2015   SVT (supraventricular tachycardia)    Left-sided carotid artery disease ([place]) 03/08/2015   Dizziness 01/24/2015   Urinary frequency 01/13/2015   Dizziness and giddiness 01/09/2015   Gallstones 02/21/2013   Malignant neoplasm of upper-inner quadrant of left breast in female, estrogen receptor positive ([place]) 11/28/2012   Muscle cramping 09/12/2012   Right hip pain 09/12/2012   Lower back pain 09/12/2012   COPD GOLD I     Hx of radiation therapy    Right shoulder pain 09/14/2011   Preventative health care 07/29/2010   Skin lesion of left leg 07/29/2010   Paresthesia 07/29/2010   GERD 03/28/2010   CONSTIPATION 03/28/2010   Osteoarthrosis, 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   Anxiety state 05/03/2009   Hyperlipidemia 06/14/2007   FATIGUE 06/14/2007   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  Rationale for Evaluation and Treatment Rehabilitation  SUBJECTIVE:                                                                                                                                                                                          SUBJECTIVE STATEMENT: My am looks good. The forearm seems pretty normal but the upper arm is still swollen. I took the wrap off this morning and bought the clean ones. My friend comes next week and I only see you 1x next week  PERTINENT HISTORY:  Hx breast cancer with 15 lymph node removal on the L and Bil lumpectomy. Lumpectomy on the L in 1997and the R in 2012. Radiation Bil.  New discovery of metastatic breast Cancer presently on Palbocidib and Anastrozole,  Fulvestrant since 06/2019.Recent PET scan on 10/09/2021 showed no evidence of recurrent or metastatic disease. Prior lymphedema treatment for Left UE in 2021  PAIN:  Are you having pain? No pain today NPRS scale: pt did not rate on scale Pain location: wrist and hand on left  Pain orientation: Left  PAIN TYPE: stabbing Pain description: intermittent  Aggravating factors: evening Relieving factors: Tribute wrap but doesn't wear the hand piece.  PRECAUTIONS: Other: Left UE lymphedema ,  Metastatic breast Cancer, COPD  WEIGHT BEARING RESTRICTIONS: No  FALLS:  Has patient fallen in last 6 months? No  LIVING ENVIRONMENT: Lives with: lives alone Lives in: House/apartment Stairs: No; External: 1 steps; on right going up Has following equipment at home: None  OCCUPATION: retired  LEISURE: reading,  puzzles, walks very little  HAND DOMINANCE: right   PRIOR LEVEL OF FUNCTION: Independent  PATIENT GOALS: Try and getting swelling in left arm down. Get a new compression sleeve that fits properly   OBJECTIVE:  COGNITION: Overall cognitive status: Within functional limits for tasks assessed   PALPATION: Mild pitting edema ulnar border of Left UE  OBSERVATIONS / OTHER ASSESSMENTS: Bandaid on forearm with small amount of clear  drainage showing through. Other aspect of incision that can be viewed is healed. Sensation to light touch in the hand is intact. Dorsum of hand is slightly swollen, and upper arm swollen above where she had TG soft folded over and with bottom folded over several inches, may be making hand swell. She complains of left wrist pain worse at night and has a weak left grip  SENSATION: Light touch: Appears intact throughout left UE   POSTURE: forward head, rounded shoulders  CERVICAL AROM: All within functional limits:   UPPER EXTREMITY STRENGTH: NT  LYMPHEDEMA ASSESSMENTS:   SURGERY TYPE/DATE: Left breast Lumpectomy 1997, Right lumpectomy 2012  NUMBER OF LYMPH NODES REMOVED: 15 on left UE  CHEMOTHERAPY:Yes, presently on medications for metastasis  RADIATION:bilateral   HORMONE TREATMENT: yes  INFECTIONS: yes  LYMPHEDEMA ASSESSMENTS:   LANDMARK RIGHT  eval  10 cm proximal to olecranon process 27.5  Olecranon process 22.4  10 cm proximal to ulnar styloid process 19.0  Just proximal to ulnar styloid process 15.2  Across hand at thumb web space 17.9  At base of 2nd digit 6.3  (Blank rows = not tested)  LANDMARK LEFT  eval LEFT 12/18/2021 LEFT 12/24/2021 LEFT 12/29/21 LEFT 01/01/2022 LEFT 01/20/2022 LEFT 01/29/2022 LEFT 02/04/2022 LEFT 02/09/2022 LEFT 02/19/2022  10 cm proximal to olecranon process 28.2 29.0 28.6 28.5 28 28.3 28.0 29.0 29.0 29.2  Olecranon process 27.5 25.3 25.6 25.5 25.6 25.5 24.5 25.0 25 25.3  10 cm proximal to ulnar styloid  process 24.0 21 21.6 21.8 21.3 21.3 20.6 21.0 20.5 20.5  Just proximal to ulnar styloid process 18.2 17.7 18.2 17.5 17.7 17.8 16.7 17.0 17.3 17.3  Across hand at thumb web space 18.5 18.0 18.3 17.5 17.7 17.6 17.7 17.8 17.7 17.6  At base of 2nd digit 6.5 6.3 6.3 6.4 6.3 6.35 6.2 6.2 6.2 6.2  (Blank rows = not tested)  Wrist   16.4     Elbow  26           axilla  29.2,     MCPS17.3    wrist 16.8  (SLEEVE MEASUREMENTS)    LLIS : 47%   TODAY'S TREATMENT:  DATE  03/02/2022 Manual lymph drainage in supine as follows: short neck, (no diaphragmatic breathing due to shortness of breath from medication) , left inguinal nodes, left axillo-inguinal anastamoses. Left upper extremity working proximal to distal then retracing all steps being very careful around area on forearm where pt recently had surgery. Compression bandaging: TG soft from hand to axilla, mollelast to digits 1-4 , artiflex from hand to axilla, 1 6cm bandage at hand, 1 8 cm bandage from wrist to axilla in herringbone, 1 12 cm bandage from hand to axilla with spiral.. Pt advised if wrap is loose on Wed. She can try her day sleeve with the medi harmony glove that the thumb is too big during the day  and wear her Circaid at night on Wed. Before coming in here Thursday with her sleeve and harmony glove. If the harmony glove works despite the thumb being very big, she may be able to get by with it until her custom glove comes.   02/26/2022 Called Romeo Rabon. [facility]Custom glove has not been ordered. Large volume of Medicare orders has slowed things down.  Custom glove will be 305.98 and medicare will pay for. They will send return label to pt. By mail to return 3 medi harmony gloves which she received for unknown reasons. Wraps removed and lotion applied Manual lymph drainage in supine as follows: short neck, (no diaphragmatic  breathing due to shortness of breath from medication) , left inguinal nodes, left axillo-inguinal anastamoses. Left upper extremity working proximal to distal then retracing all steps being very careful around area on forearm where pt recently had surgery. Compression bandaging: TG soft from hand to axilla, mollelast to digits 1-4 , artiflex from hand to axilla, 1 6cm bandage at hand, 1 8 cm bandage from wrist to axilla in herringbone, 1 12 cm bandage from hand to axilla with spiral..  02/23/2022 Tried calling Romeo Rabon at [place]. Pt has now received 3 Medi Harmony incorrect gloves and has still not received her custom glove and pt has to continue PT until she receives it. Manual lymph drainage in supine as follows: short neck, (no diaphragmatic breathing due to shortness of breath from medication) , left inguinal nodes, left axillo-inguinal anastamoses. Left upper extremity working proximal to distal then retracing all steps being very careful around area on forearm where pt recently had surgery. Compression bandaging: TG soft from hand to axilla, mollelast to digits 1-4 , artiflex from hand to axilla, 1 6cm bandage at hand, 1 8 cm bandage from wrist to axilla in herringbone, 1 12 cm bandage from hand to axilla with spiral..    02/19/2022 Pt re- measured and maintaining well while waiting for garment Manual lymph drainage in supine as follows: short neck, (no diaphragmatic breathing due to shortness of breath from medication) , left inguinal nodes, left axillo-inguinal anastamoses. Left upper extremity working proximal to distal then retracing all steps being very careful around area on forearm where pt recently had surgery. Compression bandaging: TG soft from hand to axilla, mollelast to digits 1-4 , artiflex from hand to axilla, 1 6cm bandage at hand, 1 8 cm bandage from wrist to axilla in herringbone, 1 12 cm bandage from hand to axilla with spiral..   02/16/2022 Wraps removed Manual lymph  drainage in supine as follows: short neck, (no diaphragmatic breathing due to shortness of breath from medication) , left inguinal nodes, left axillo-inguinal anastamoses. Left upper extremity working proximal to distal then retracing all steps being very careful around  area on forearm where pt recently had surgery. Compression bandaging: TG soft from hand to axilla, mollelast to digits 1-4 , artiflex from hand to axilla, 1 6cm bandage at hand, 1 8 cm bandage from wrist to axilla in herringbone, 1 12 cm bandage from hand to axilla with spiral..    02/11/2021 Had pt try putting her sleeve on the arm butler which she did with minimal difficulty Manual lymph drainage in supine as follows: short neck, (no diaphragmatic breathing due to shortness of breath from medication) , left inguinal nodes, left axillo-inguinal anastamoses. Left upper extremity working proximal to distal then retracing all steps being very careful around area on forearm where pt recently had surgery. Compression bandaging: TG soft from hand to axilla, mollelast to digits 1-4 , artiflex from hand to axilla, 1 6cm bandage at hand, 1 8 cm bandage from wrist to axilla in herringbone, 1 12 cm bandage from hand to axilla with spiral.  02/09/2021 All wraps removed and pt remeasured. Slight irritation again noted above elbow but no skin break down Practiced donning sleeve several different ways to see how pt did the best. Tried Juzo foot slip eze over hand with pt pulling sleeve over it. Pt did fairly well initially but got the sleeve bunched up and was not able to get it any higher without help. She did well with the rubber gloves and mild assist but was not quite getting high enough without help. Removed sleeve and had pt try the arm butler. Therapist placed sleeve over butler and pt felt she would be able to do the same.  She did much better getting her arm into the sleeve and was only lacking getting it about an inch from the top. She was able  to move it up with the rubber glove. When on correctly sleeve seemed to fit well. Pt had only received 2 Medi harmony gloves and we are not sure where they came from as we ordered an espirit glove. Demetrius Charity was too big and was not flat knit so removed pt sleeve and therapist wrapped pts arm: applied cocoa butter then Compression bandaging: TG soft from hand to axilla, mollelast to digits 1-4 , artiflex from hand to axilla, 1 6cm bandage at hand, 1 8 cm bandage from wrist to axilla in herringbone, 1 12 cm bandage from hand to axilla with spiral. Manus Gunning PT also looked at pts sleeve and invoice which showed that a medi espirit glove was ordered. Pt will check to see if she can find another glove at home. Not sure how she received 2 harmony gloves        PATIENT EDUCATION:  Education details: discussed POC, bandaging, new garments, need for 3x/week, instruct family member for weekends if she would like. Person educated: Patient Education method: Explanation Education comprehension: verbalized understanding  HOME EXERCISE PROGRAM: None given today  ASSESSMENT:  CLINICAL IMPRESSION: 03/02/2022 Forearm looks very good and upper arm still with some swelling. Tolerating wraps but advised if her wrap is loose on Wednesday she can try the harmony glove and sleeve during the day on Wednesday and the Circaid at night before coming in here on Thursday so her arm may be checked. The glove is too large at her thumb but the dorsum of hand may be tight enough that fingers don't swell.It is worth a try as she still doesn't have her day custom glove. 02/26/2022 Pt continues to be frustrated with the length of time she has had to wait  to get her custom glove. Pt requires custom garments as her arm is large  for a ready made garment and she requires a flat knit sleeve to contain it. She does not fit into a ready made sleeve or glove. Garment fitting has been complicated by her decreased bilateral hand  strength making donning garments challenging. She did receive her custom sleeve several weeks ago and was able to don it with the arm [name]. She also has her night time Circaid. Unfortunately, she is not able to be released to use these items independently as she is awaiting her custom glove. She requires skilled therapy to continue to perform MLD and bandaging until she can get into her custom garments as she is unable to bandage herself due to decreased strength. Her custom glove has been delayed due to a huge increase in volume of garments being ordered since January first. She has had very good reduction overall and is pleased with her progress, but frustrated to have to continue outpatient PT until garments are received.  OBJECTIVE IMPAIRMENTS: decreased activity tolerance, decreased knowledge of condition, decreased ROM, increased edema, impaired UE functional use, postural dysfunction, and pain.   ACTIVITY LIMITATIONS: lifting, dressing, and reach over head  PARTICIPATION LIMITATIONS: cleaning, yard work, and reaching activities  PERSONAL FACTORS: 3+ comorbidities: Breast Cancer s/p radiation and with metastasis, COPD, OA, current cancer meds  are also affecting patient's functional outcome.   REHAB POTENTIAL: Good  CLINICAL DECISION MAKING: Stable/uncomplicated  EVALUATION COMPLEXITY: Low  GOALS: Goals reviewed with patient? Yes  SHORT TERM GOALS: Target date: 03/23/2022    Pt will be tolerant of compression bandaging Baseline: Goal status: MET  12/18/2021 2.  Pt will have reduction at olecranon and 10 cm prox to ulna by 1.5 cm Baseline: Goal status: MET 12/18/2021  LONG TERM GOALS: Target date: 04/13/2022    Pt will have decreased edema at olecranon and 10 cm prox to ulna styloid by 2.5 cm Baseline: 24.0 Goal status: MET 21.3 cm on 01/05/22  2.  Pt will be fit for appropriate compression sleeve Baseline:  Goal status: MET has proper sleeve, awaiting glove  3.  Pt will be  independent with self care of lymphedema including day and night compression Baseline:  Goal status: IN PROGRESS( has day sleeve, and night circaid, awaiting glove)  4.  Pt will improve LLIS to no greater than 30% to demonstrate improved function  Baseline:  Goal status: IN PROGRESS    PLAN:  PT FREQUENCY: 2x/week  PT DURATION: 4 weeks  PLANNED INTERVENTIONS: Therapeutic exercises, Patient/Family education, Self Care, Orthotic/Fit training, Manual lymph drainage, Compression bandaging, and Manual therapy  PLAN FOR NEXT SESSION: RECERT,Continue until pt receives her new  custom day glove. ,  continue CDT until pt receives all garments due to unable to wrap herself.Awaiting new daytime glove, has already received Circaid Night  Claris Pong, PT 03/02/2022, 5:20 PM

## 2022-03-05 ENCOUNTER — Ambulatory Visit: Payer: Medicare Other | Attending: Dermatology

## 2022-03-05 DIAGNOSIS — C44619 Basal cell carcinoma of skin of left upper limb, including shoulder: Secondary | ICD-10-CM | POA: Diagnosis not present

## 2022-03-05 DIAGNOSIS — I890 Lymphedema, not elsewhere classified: Secondary | ICD-10-CM | POA: Insufficient documentation

## 2022-03-05 DIAGNOSIS — C44629 Squamous cell carcinoma of skin of left upper limb, including shoulder: Secondary | ICD-10-CM | POA: Insufficient documentation

## 2022-03-05 NOTE — Therapy (Signed)
OUTPATIENT PHYSICAL THERAPY ONCOLOGY TREATMENT  Patient Name: Stacey [name] MRN: [id number] DOB:[birth year], 82 y.o., female Today's Date: 03/05/2022     PT End of Session - 03/05/22 1458     Visit Number 25    Number of Visits 31    Date for PT Re-Evaluation 04/16/22    PT Start Time 1500    PT Stop Time 4332    PT Time Calculation (min) 50 min    Activity Tolerance Patient tolerated treatment well    Behavior During Therapy [facility] for tasks assessed/performed               Past Medical History:  Diagnosis Date   ANEMIA-NOS    ANXIETY    Breast cancer ([place]) 1997 L, 2012 R   s/p chemo/xrt   COPD    resolved   DIVERTICULOSIS, COLON 2008   Dizziness    Family history of breast cancer    Family history of lung cancer    Family history of lymphoma    Family history of pancreatic cancer    Family history of uterine cancer    GERD    Hx of radiation therapy 10/19/11 -12/03/11   right breast   HYPERLIPIDEMIA    IRRITABLE BOWEL SYNDROME, HX OF    Left-sided carotid artery disease (HCC)    moderate left ICA stenosis   OSTEOARTHRITIS, HAND    PSVT (paroxysmal supraventricular tachycardia)    symptomatic on event monitor   Past Surgical History:  Procedure Laterality Date   ABDOMINAL HYSTERECTOMY     APPENDECTOMY     BREAST BIOPSY  11/14/10    r breast: inv, insitu mammary carcinoma w/calcif, er/pr +, her2 -   BREAST SURGERY     lumpectomy   CATARACT EXTRACTION     both eyes   ELECTROPHYSIOLOGIC STUDY N/A 05/10/2015   Procedure: SVT Ablation;  Surgeon: Will Meredith Leeds, MD;  Location: [facility];  Service: Cardiovascular;  Laterality: N/A;   HERNIA REPAIR     inguinal herniorrhapy  1984   left   IR US GUIDE BX ASP/DRAIN  05/26/2019   rectal fissure repair     s/p benign breast biopsy  2003   right   s/p left foot surgury  2009   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   spiral fx left foot  2008   no surgury   TMJ ARTHROPLASTY  1989    TONGUE SURGERY     1988- to remove scar tissue growth    TONSILLECTOMY     Patient Active Problem List   Diagnosis Date Noted   Acute upper respiratory infection 05/16/2021   Nasal dryness 02/04/2021   Chronic sinusitis 11/25/2020   Allergic rhinitis 07/08/2020   Hypomagnesemia 06/10/2020   Goals of care, counseling/discussion 06/21/2019   Family history of breast cancer    Family history of pancreatic cancer    Family history of uterine cancer    Family history of lymphoma    Family history of lung cancer    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[place]) 06/02/2019   Recurrent cancer of left breast ([place]) 05/15/2019   Pes anserine bursitis 03/31/2019   HTN (hypertension) 02/10/2019   Hyperglycemia 06/21/2018   Left carpal tunnel syndrome 02/22/2018   Rotator cuff arthropathy of left shoulder 09/20/2017   Arthritis of hand 08/23/2017   Pain in right hand 08/23/2017   Cervical radiculitis 04/09/2017  Left shoulder pain 04/09/2017   Dyspnea on exertion 05/14/2016   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   Lymphedema of left arm 01/14/2016   Left arm swelling 12/25/2015   SVT (supraventricular tachycardia)    Left-sided carotid artery disease ([place]) 03/08/2015   Dizziness 01/24/2015   Urinary frequency 01/13/2015   Dizziness and giddiness 01/09/2015   Gallstones 02/21/2013   Malignant neoplasm of upper-inner quadrant of left breast in female, estrogen receptor positive ([place]) 11/28/2012   Muscle cramping 09/12/2012   Right hip pain 09/12/2012   Lower back pain 09/12/2012   COPD GOLD I     Hx of radiation therapy    Right shoulder pain 09/14/2011   Preventative health care 07/29/2010   Skin lesion of left leg 07/29/2010   Paresthesia 07/29/2010   GERD 03/28/2010   CONSTIPATION 03/28/2010   Osteoarthrosis, 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   Anxiety state 05/03/2009   Hyperlipidemia 06/14/2007   FATIGUE 06/14/2007   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  Rationale for Evaluation and Treatment Rehabilitation  SUBJECTIVE:                                                                                                                                                                                          SUBJECTIVE STATEMENT: I put the sleeve on yesterday with the harmony glove and I slept in the night Circaid and that felt good.  I had trouble getting the sleeve over the arm [name] and getting it up high enough. It was a struggle.   PERTINENT HISTORY:  Hx breast cancer with 15 lymph node removal on the L and Bil lumpectomy. Lumpectomy on the L in 1997and the R in 2012. Radiation Bil.  New discovery of metastatic breast Cancer presently on Palbocidib and Anastrozole,  Fulvestrant since 06/2019.Recent PET scan on 10/09/2021 showed no evidence of recurrent or metastatic disease. Prior lymphedema treatment for Left UE in 2021  PAIN:  Are you having pain? No pain today NPRS scale: pt did not rate on scale Pain location: wrist and hand on left  Pain orientation: Left  PAIN TYPE: stabbing Pain description: intermittent  Aggravating factors: evening Relieving factors: Tribute wrap but doesn't wear the hand piece.  PRECAUTIONS: Other: Left UE  lymphedema , Metastatic breast Cancer, COPD  WEIGHT BEARING RESTRICTIONS: No  FALLS:  Has patient fallen in last 6 months? No  LIVING ENVIRONMENT: Lives with: lives alone Lives in: House/apartment Stairs: No; External: 1 steps; on right going up Has following equipment at home: None  OCCUPATION: retired  LEISURE:  reading, puzzles, walks very little  HAND DOMINANCE: right   PRIOR LEVEL OF FUNCTION: Independent  PATIENT GOALS: Try and getting swelling in left arm down. Get a new compression sleeve that fits properly   OBJECTIVE:  COGNITION: Overall cognitive status: Within functional limits for tasks assessed   PALPATION: Mild pitting edema ulnar border of Left UE  OBSERVATIONS / OTHER ASSESSMENTS: Bandaid on forearm with small amount of clear  drainage showing through. Other aspect of incision that can be viewed is healed. Sensation to light touch in the hand is intact. Dorsum of hand is slightly swollen, and upper arm swollen above where she had TG soft folded over and with bottom folded over several inches, may be making hand swell. She complains of left wrist pain worse at night and has a weak left grip  SENSATION: Light touch: Appears intact throughout left UE   POSTURE: forward head, rounded shoulders  CERVICAL AROM: All within functional limits:   UPPER EXTREMITY STRENGTH: NT  LYMPHEDEMA ASSESSMENTS:   SURGERY TYPE/DATE: Left breast Lumpectomy 1997, Right lumpectomy 2012  NUMBER OF LYMPH NODES REMOVED: 15 on left UE  CHEMOTHERAPY:Yes, presently on medications for metastasis  RADIATION:bilateral   HORMONE TREATMENT: yes  INFECTIONS: yes  LYMPHEDEMA ASSESSMENTS:   LANDMARK RIGHT  eval  10 cm proximal to olecranon process 27.5  Olecranon process 22.4  10 cm proximal to ulnar styloid process 19.0  Just proximal to ulnar styloid process 15.2  Across hand at thumb web space 17.9  At base of 2nd digit 6.3  (Blank rows = not tested)  LANDMARK LEFT  eval LEFT 12/18/2021 LEFT 12/24/2021 LEFT 12/29/21 LEFT 01/01/2022 LEFT 01/20/2022 LEFT 01/29/2022 LEFT 02/04/2022 LEFT 02/09/2022 LEFT 02/19/2022 LEFT 03/05/2022  10 cm proximal to olecranon process 28.2 29.0 28.6 28.5 28 28.3 28.0 29.0 29.0 29.2 29.3  Olecranon process 27.5 25.3 25.6 25.5 25.6 25.5 24.5 25.0 25 25.3 24.9   10 cm proximal to ulnar styloid process 24.0 21 21.6 21.8 21.3 21.3 20.6 21.0 20.5 20.5 21.0  Just proximal to ulnar styloid process 18.2 17.7 18.2 17.5 17.7 17.8 16.7 17.0 17.3 17.3 17.5  Across hand at thumb web space 18.5 18.0 18.3 17.5 17.7 17.6 17.7 17.8 17.7 17.6 17.3  At base of 2nd digit 6.5 6.3 6.3 6.4 6.3 6.35 6.2 6.2 6.2 6.2 6.1  (Blank rows = not tested)  Wrist   16.4     Elbow  26           axilla  29.2,     MCPS17.3    wrist 16.8  (SLEEVE MEASUREMENTS)    LLIS : 47%   TODAY'S TREATMENT:  DATE  03/05/2022 Removed sleeve and Medi Harmony glove and remeasured pt. No swelling in the hand, and the arm looks good as well. The sleeve was about an inch too low at the top and the wrist on the sleeve is large.  Pt did indicate that she struggled with the arm butler and actually tried to stretch the wrist over it which may have stretched it out. She will wash the sleeve tonight or tomorrow to try and tighten it up some. Manual lymph drainage in supine as follows: short neck, (no diaphragmatic breathing due to shortness of breath from medication) , left inguinal nodes, left axillo-inguinal anastamoses. Left upper extremity working proximal to distal then retracing all steps being very careful around area on forearm where pt recently had surgery. Pt shown how to turn the top down over the sleeve and stretch the upper arm part over the arm [place]. She was able to do that much more easily since she was trying before to stretch the wrist over it. She was able to get her arm most of the way into the sleeve with the arm butler, but did not quite get it over her wrist to start, and was about an inch too low.  Able to correct with rubber glove and slight assist from PT. Pt took pictures of the sleeve on the arm butler with her phone.  03/02/2022 Manual lymph drainage in supine as  follows: short neck, (no diaphragmatic breathing due to shortness of breath from medication) , left inguinal nodes, left axillo-inguinal anastamoses. Left upper extremity working proximal to distal then retracing all steps being very careful around area on forearm where pt recently had surgery. Compression bandaging: TG soft from hand to axilla, mollelast to digits 1-4 , artiflex from hand to axilla, 1 6cm bandage at hand, 1 8 cm bandage from wrist to axilla in herringbone, 1 12 cm bandage from hand to axilla with spiral.. Pt advised if wrap is loose on Wed. She can try her day sleeve with the medi harmony glove that the thumb is too big during the day  and wear her Circaid at night on Wed. Before coming in here Thursday with her sleeve and harmony glove. If the harmony glove works despite the thumb being very big, she may be able to get by with it until her custom glove comes.   02/26/2022 Called Romeo Rabon. [employer]Custom glove has not been ordered. Large volume of Medicare orders has slowed things down.  Custom glove will be 305.98 and medicare will pay for. They will send return label to pt. By mail to return 3 medi harmony gloves which she received for unknown reasons. Wraps removed and lotion applied Manual lymph drainage in supine as follows: short neck, (no diaphragmatic breathing due to shortness of breath from medication) , left inguinal nodes, left axillo-inguinal anastamoses. Left upper extremity working proximal to distal then retracing all steps being very careful around area on forearm where pt recently had surgery. Compression bandaging: TG soft from hand to axilla, mollelast to digits 1-4 , artiflex from hand to axilla, 1 6cm bandage at hand, 1 8 cm bandage from wrist to axilla in herringbone, 1 12 cm bandage from hand to axilla with spiral..  02/23/2022 Tried calling Romeo Rabon at [place]. Pt has now received 3 Medi Harmony incorrect gloves and has still not received her custom glove and pt  has to continue PT until she receives it. Manual lymph drainage in supine as follows: short neck, (no  diaphragmatic breathing due to shortness of breath from medication) , left inguinal nodes, left axillo-inguinal anastamoses. Left upper extremity working proximal to distal then retracing all steps being very careful around area on forearm where pt recently had surgery. Compression bandaging: TG soft from hand to axilla, mollelast to digits 1-4 , artiflex from hand to axilla, 1 6cm bandage at hand, 1 8 cm bandage from wrist to axilla in herringbone, 1 12 cm bandage from hand to axilla with spiral..    02/19/2022 Pt re- measured and maintaining well while waiting for garment Manual lymph drainage in supine as follows: short neck, (no diaphragmatic breathing due to shortness of breath from medication) , left inguinal nodes, left axillo-inguinal anastamoses. Left upper extremity working proximal to distal then retracing all steps being very careful around area on forearm where pt recently had surgery. Compression bandaging: TG soft from hand to axilla, mollelast to digits 1-4 , artiflex from hand to axilla, 1 6cm bandage at hand, 1 8 cm bandage from wrist to axilla in herringbone, 1 12 cm bandage from hand to axilla with spiral..   02/16/2022 Wraps removed Manual lymph drainage in supine as follows: short neck, (no diaphragmatic breathing due to shortness of breath from medication) , left inguinal nodes, left axillo-inguinal anastamoses. Left upper extremity working proximal to distal then retracing all steps being very careful around area on forearm where pt recently had surgery. Compression bandaging: TG soft from hand to axilla, mollelast to digits 1-4 , artiflex from hand to axilla, 1 6cm bandage at hand, 1 8 cm bandage from wrist to axilla in herringbone, 1 12 cm bandage from hand to axilla with spiral..    02/11/2021 Had pt try putting her sleeve on the arm butler which she did with minimal  difficulty Manual lymph drainage in supine as follows: short neck, (no diaphragmatic breathing due to shortness of breath from medication) , left inguinal nodes, left axillo-inguinal anastamoses. Left upper extremity working proximal to distal then retracing all steps being very careful around area on forearm where pt recently had surgery. Compression bandaging: TG soft from hand to axilla, mollelast to digits 1-4 , artiflex from hand to axilla, 1 6cm bandage at hand, 1 8 cm bandage from wrist to axilla in herringbone, 1 12 cm bandage from hand to axilla with spiral.  02/09/2021 All wraps removed and pt remeasured. Slight irritation again noted above elbow but no skin break down Practiced donning sleeve several different ways to see how pt did the best. Tried Juzo foot slip eze over hand with pt pulling sleeve over it. Pt did fairly well initially but got the sleeve bunched up and was not able to get it any higher without help. She did well with the rubber gloves and mild assist but was not quite getting high enough without help. Removed sleeve and had pt try the arm butler. Therapist placed sleeve over butler and pt felt she would be able to do the same.  She did much better getting her arm into the sleeve and was only lacking getting it about an inch from the top. She was able to move it up with the rubber glove. When on correctly sleeve seemed to fit well. Pt had only received 2 Medi harmony gloves and we are not sure where they came from as we ordered an espirit glove. Demetrius Charity was too big and was not flat knit so removed pt sleeve and therapist wrapped pts arm: applied cocoa butter then Compression bandaging: TG soft  from hand to axilla, mollelast to digits 1-4 , artiflex from hand to axilla, 1 6cm bandage at hand, 1 8 cm bandage from wrist to axilla in herringbone, 1 12 cm bandage from hand to axilla with spiral. Manus Gunning PT also looked at pts sleeve and invoice which showed that a medi  espirit glove was ordered. Pt will check to see if she can find another glove at home. Not sure how she received 2 harmony gloves        PATIENT EDUCATION:  Education details: discussed POC, bandaging, new garments, need for 3x/week, instruct family member for weekends if she would like. Person educated: Patient Education method: Explanation Education comprehension: verbalized understanding  HOME EXERCISE PROGRAM: None given today  ASSESSMENT:  CLINICAL IMPRESSION: Pt was able to maintain her hand without increased swelling using the [facility] Glove, and was able to stretch the sleeve properly over the arm butler when given visual directions how to do it. She is able to don the Circaid Night without difficulty but did not use the over sleeve. She is still awaiting the Custom glove, but since the Harmony is holding her hand down she will continue with the sleeve and glove and will come in for recheck next Wednesday. A friend will be staying with her for a long weekend and will be able to assist as well. Custom Glove has been delayed for weeks due to so many orders being placed with Medicare and [employer] not able to keep up. Hope to have custom in a week or 2, but could not be guaranteed by the company. She will come in for a check next week and if doing well will stretch out 2 weeks or until glove is received.      OBJECTIVE IMPAIRMENTS: decreased activity tolerance, decreased knowledge of condition, decreased ROM, increased edema, impaired UE functional use, postural dysfunction, and pain.   ACTIVITY LIMITATIONS: lifting, dressing, and reach over head  PARTICIPATION LIMITATIONS: cleaning, yard work, and reaching activities  PERSONAL FACTORS: 3+ comorbidities: Breast Cancer s/p radiation and with metastasis, COPD, OA, current cancer meds  are also affecting patient's functional outcome.   REHAB POTENTIAL: Good  CLINICAL DECISION MAKING: Stable/uncomplicated  EVALUATION  COMPLEXITY: Low  GOALS: Goals reviewed with patient? Yes  SHORT TERM GOALS: Target date: 03/26/2022    Pt will be tolerant of compression bandaging Baseline: Goal status: MET  12/18/2021 2.  Pt will have reduction at olecranon and 10 cm prox to ulna by 1.5 cm Baseline: Goal status: MET 12/18/2021  LONG TERM GOALS: Target date: 04/16/2022    Pt will have decreased edema at olecranon and 10 cm prox to ulna styloid by 2.5 cm Baseline: 24.0 Goal status: MET 21.3 cm on 01/05/22  2.  Pt will be fit for appropriate compression sleeve Baseline:  Goal status: MET has proper sleeve, awaiting glove  3.  Pt will be independent with self care of lymphedema including day and night compression Baseline:  Goal status: IN PROGRESS( has day sleeve, and night circaid, awaiting glove)  4.  Pt will improve LLIS to no greater than 30% to demonstrate improved function  Baseline:  Goal status: IN PROGRESS    PLAN:  PT FREQUENCY: 1 time every week or 2  PT DURATION:6 weeks PLANNED INTERVENTIONS: Therapeutic exercises, Patient/Family education, Self Care, Orthotic/Fit training, Manual lymph drainage, Compression bandaging, and Manual therapy  PLAN FOR NEXT SESSION: ,still awaiting custom glove but is trying Medi harmony temporarily and seems to be holding  hand fine.. ,  Reassess next week, if doing well put out 2 weeks. Discuss again washing garments,Awaiting new daytime glove, has already received Circaid Night  Claris Pong, PT 03/05/2022, 5:40 PM

## 2022-03-09 ENCOUNTER — Other Ambulatory Visit: Payer: Self-pay

## 2022-03-09 ENCOUNTER — Other Ambulatory Visit (HOSPITAL_COMMUNITY): Payer: Self-pay

## 2022-03-09 DIAGNOSIS — Z170 Estrogen receptor positive status [ER+]: Secondary | ICD-10-CM

## 2022-03-09 MED ORDER — PALBOCICLIB 100 MG PO TABS
ORAL_TABLET | ORAL | 6 refills | Status: DC
  Filled 2022-03-09: qty 15, fill #0

## 2022-03-11 ENCOUNTER — Telehealth: Payer: Self-pay

## 2022-03-11 ENCOUNTER — Encounter: Payer: Self-pay | Admitting: Oncology

## 2022-03-11 ENCOUNTER — Ambulatory Visit: Payer: Medicare Other

## 2022-03-11 ENCOUNTER — Other Ambulatory Visit (HOSPITAL_COMMUNITY): Payer: Self-pay

## 2022-03-11 DIAGNOSIS — C50212 Malignant neoplasm of upper-inner quadrant of left female breast: Secondary | ICD-10-CM

## 2022-03-11 MED ORDER — PALBOCICLIB 100 MG PO TABS
ORAL_TABLET | ORAL | 6 refills | Status: DC
  Filled 2022-03-11: qty 21, 29d supply, fill #0
  Filled 2022-03-16: qty 21, 21d supply, fill #0
  Filled 2022-05-01: qty 21, 35d supply, fill #1
  Filled 2022-05-06: qty 21, 28d supply, fill #1
  Filled 2022-06-11: qty 21, 28d supply, fill #2
  Filled 2022-07-07: qty 21, 28d supply, fill #3
  Filled 2022-08-17: qty 21, 28d supply, fill #4
  Filled 2022-09-24: qty 21, 28d supply, fill #5
  Filled 2022-11-13: qty 21, 28d supply, fill #6

## 2022-03-13 ENCOUNTER — Inpatient Hospital Stay: Payer: Medicare Other | Attending: Adult Health

## 2022-03-13 ENCOUNTER — Other Ambulatory Visit: Payer: Self-pay

## 2022-03-13 ENCOUNTER — Telehealth: Payer: Self-pay

## 2022-03-13 ENCOUNTER — Inpatient Hospital Stay: Payer: Medicare Other | Admitting: Hematology and Oncology

## 2022-03-13 ENCOUNTER — Other Ambulatory Visit (HOSPITAL_COMMUNITY): Payer: Self-pay

## 2022-03-13 ENCOUNTER — Telehealth: Payer: Self-pay | Admitting: *Deleted

## 2022-03-13 ENCOUNTER — Telehealth: Payer: Self-pay | Admitting: Physician Assistant

## 2022-03-13 ENCOUNTER — Inpatient Hospital Stay: Payer: Medicare Other

## 2022-03-13 VITALS — BP 157/70 | HR 90 | Temp 98.2°F | Resp 18

## 2022-03-13 DIAGNOSIS — Z170 Estrogen receptor positive status [ER+]: Secondary | ICD-10-CM | POA: Diagnosis not present

## 2022-03-13 DIAGNOSIS — Z923 Personal history of irradiation: Secondary | ICD-10-CM | POA: Diagnosis not present

## 2022-03-13 DIAGNOSIS — Z5111 Encounter for antineoplastic chemotherapy: Secondary | ICD-10-CM | POA: Insufficient documentation

## 2022-03-13 DIAGNOSIS — M8580 Other specified disorders of bone density and structure, unspecified site: Secondary | ICD-10-CM | POA: Insufficient documentation

## 2022-03-13 DIAGNOSIS — C50212 Malignant neoplasm of upper-inner quadrant of left female breast: Secondary | ICD-10-CM

## 2022-03-13 DIAGNOSIS — C7951 Secondary malignant neoplasm of bone: Secondary | ICD-10-CM

## 2022-03-13 DIAGNOSIS — Z79899 Other long term (current) drug therapy: Secondary | ICD-10-CM | POA: Insufficient documentation

## 2022-03-13 DIAGNOSIS — Z7982 Long term (current) use of aspirin: Secondary | ICD-10-CM | POA: Insufficient documentation

## 2022-03-13 DIAGNOSIS — Z79818 Long term (current) use of other agents affecting estrogen receptors and estrogen levels: Secondary | ICD-10-CM | POA: Insufficient documentation

## 2022-03-13 LAB — CMP (CANCER CENTER ONLY)
ALT: 11 U/L (ref 0–44)
AST: 17 U/L (ref 15–41)
Albumin: 4 g/dL (ref 3.5–5.0)
Alkaline Phosphatase: 54 U/L (ref 38–126)
Anion gap: 10 (ref 5–15)
BUN: 37 mg/dL — ABNORMAL HIGH (ref 8–23)
CO2: 17 mmol/L — ABNORMAL LOW (ref 22–32)
Calcium: 8.9 mg/dL (ref 8.9–10.3)
Chloride: 110 mmol/L (ref 98–111)
Creatinine: 1.37 mg/dL — ABNORMAL HIGH (ref 0.44–1.00)
GFR, Estimated: 39 mL/min — ABNORMAL LOW (ref >60)
Glucose, Bld: 108 mg/dL — ABNORMAL HIGH (ref 70–99)
Potassium: 3.9 mmol/L (ref 3.5–5.1)
Sodium: 137 mmol/L (ref 135–145)
Total Bilirubin: 0.3 mg/dL (ref 0.3–1.2)
Total Protein: 7.6 g/dL (ref 6.5–8.1)

## 2022-03-13 LAB — CBC WITH DIFFERENTIAL (CANCER CENTER ONLY)
Abs Immature Granulocytes: 0.01 10*3/uL (ref 0.00–0.07)
Basophils Absolute: 0 10*3/uL (ref 0.0–0.1)
Basophils Relative: 1 %
Eosinophils Absolute: 0 10*3/uL (ref 0.0–0.5)
Eosinophils Relative: 1 %
HCT: 25.7 % — ABNORMAL LOW (ref 36.0–46.0)
Hemoglobin: 8.9 g/dL — ABNORMAL LOW (ref 12.0–15.0)
Immature Granulocytes: 0 %
Lymphocytes Relative: 22 %
Lymphs Abs: 0.7 10*3/uL (ref 0.7–4.0)
MCH: 36.3 pg — ABNORMAL HIGH (ref 26.0–34.0)
MCHC: 34.6 g/dL (ref 30.0–36.0)
MCV: 104.9 fL — ABNORMAL HIGH (ref 80.0–100.0)
Monocytes Absolute: 0.4 10*3/uL (ref 0.1–1.0)
Monocytes Relative: 14 %
Neutro Abs: 1.9 10*3/uL (ref 1.7–7.7)
Neutrophils Relative %: 62 %
Platelet Count: 135 10*3/uL — ABNORMAL LOW (ref 150–400)
RBC: 2.45 MIL/uL — ABNORMAL LOW (ref 3.87–5.11)
RDW: 14.5 % (ref 11.5–15.5)
Smear Review: NORMAL
WBC Count: 3 10*3/uL — ABNORMAL LOW (ref 4.0–10.5)
nRBC: 0 % (ref 0.0–0.2)

## 2022-03-13 LAB — MAGNESIUM: Magnesium: 0.9 mg/dL — CL (ref 1.7–2.4)

## 2022-03-13 MED ORDER — FULVESTRANT 250 MG/5ML IM SOSY
500.0000 mg | PREFILLED_SYRINGE | Freq: Once | INTRAMUSCULAR | Status: AC
  Administered 2022-03-13: 500 mg via INTRAMUSCULAR
  Filled 2022-03-13: qty 10

## 2022-03-13 NOTE — Telephone Encounter (Signed)
Per 2/9 IB, msg left with pt

## 2022-03-13 NOTE — Telephone Encounter (Signed)
LM for patient to call us. LM that her Magnesium is VERY low, needs to take oral Magnesium this weekend and come in next week  on Monday or Tuesday for labs and be seen in [facility]. Message to scheduler.  LM for daughter with above note

## 2022-03-13 NOTE — Telephone Encounter (Addendum)
Oral Oncology Pharmacist Encounter  Spoke to patient regarding Ibrance prescription. Patient knows to take the medication per the MD instructions and to continue to take the tablets how she has already been taking it. Patient states she understands.  Prescription for Ibrance cannot be changed to capsules as patient is on omeprazole.MD cosigned prescription.  Drema Halon, PharmD Hematology/Oncology Clinical Pharmacist [facility] [phone]

## 2022-03-13 NOTE — Telephone Encounter (Signed)
CRITICAL VALUE STICKER  CRITICAL VALUE: Magnesium 0.9  RECEIVER (on-site recipient of call): Sharlynn Oliphant, RN  DATE & TIME NOTIFIED: 03/13/22 1425  MD NOTIFIED: Wilber Bihari, NP  TIME OF NOTIFICATION: 1426  RESPONSE: Have patient evaluated while in flush room.   Patient has already left the [facility]. LM for patient to call Dr Rob Hickman nurse. Attempted to call daughter, no answer.

## 2022-03-13 NOTE — Telephone Encounter (Signed)
This RN spoke with patient's daughter, Mickel Baas, regarding her low magnesium level. Mickel Baas verbalized that she will make patient aware of upcoming appointments: Infusion appointment scheduled for tomorrow, 2/10 at 11 for 4 grams IV mag. Appointment made for Monday, 2/12 at 1030 for labs and 11 for [facility]. Orders entered for both encounters. Patient's daughter verbalized understanding that it is very important for patient to come to these appointments as her magnesium is extremely low. Patient's daughter verbalized understanding that the alternative is for patient to go to the ER. ER precautions were discussed and patient's daughter will relay all information to patient.

## 2022-03-13 NOTE — Telephone Encounter (Signed)
Pt returned call. Will come tomorrow at 1100 for IV magnesium. Discussed appts for Monday

## 2022-03-13 NOTE — Telephone Encounter (Signed)
Left voicemail regarding upcoming appointments scheduled for 2/10 at 11 and 2/12 at 1030.

## 2022-03-14 ENCOUNTER — Inpatient Hospital Stay: Payer: Medicare Other

## 2022-03-14 DIAGNOSIS — Z79818 Long term (current) use of other agents affecting estrogen receptors and estrogen levels: Secondary | ICD-10-CM | POA: Diagnosis not present

## 2022-03-14 DIAGNOSIS — Z170 Estrogen receptor positive status [ER+]: Secondary | ICD-10-CM | POA: Diagnosis not present

## 2022-03-14 DIAGNOSIS — C7951 Secondary malignant neoplasm of bone: Secondary | ICD-10-CM | POA: Diagnosis not present

## 2022-03-14 DIAGNOSIS — Z5111 Encounter for antineoplastic chemotherapy: Secondary | ICD-10-CM | POA: Diagnosis not present

## 2022-03-14 DIAGNOSIS — C50212 Malignant neoplasm of upper-inner quadrant of left female breast: Secondary | ICD-10-CM | POA: Diagnosis not present

## 2022-03-14 LAB — CANCER ANTIGEN 27.29: CA 27.29: 40.6 U/mL — ABNORMAL HIGH (ref 0.0–38.6)

## 2022-03-14 MED ORDER — SODIUM CHLORIDE 0.9 % IV SOLN
Freq: Once | INTRAVENOUS | Status: AC
  Filled 2022-03-14: qty 250

## 2022-03-14 MED ORDER — MAGNESIUM SULFATE 4 GM/100ML IV SOLN
4.0000 g | Freq: Once | INTRAVENOUS | Status: AC
  Administered 2022-03-14: 4 g via INTRAVENOUS
  Filled 2022-03-14: qty 100

## 2022-03-14 NOTE — Patient Instructions (Signed)
Hypomagnesemia Hypomagnesemia is a condition in which the level of magnesium in the blood is too low. Magnesium is a mineral that is found in many foods. It is used in many different processes in the body. Hypomagnesemia can affect every organ in the body. In severe cases, it can cause life-threatening problems. What are the causes? This condition may be caused by: Not getting enough magnesium in your diet or not having enough healthy foods to eat (malnutrition). Problems with magnesium absorption in the intestines. Dehydration. Excessive use of alcohol. Vomiting. Severe or long-term (chronic) diarrhea. Some medicines, including medicines that make you urinate more often (diuretics). Certain diseases, such as kidney disease, diabetes, celiac disease, and overactive thyroid. What are the signs or symptoms? Symptoms of this condition include: Loss of appetite, nausea, and vomiting. Involuntary shaking or trembling of a body part (tremor). Muscle weakness or tingling in the arms and legs. Sudden tightening of muscles (muscle spasms). Confusion. Psychiatric issues, such as: Depression and irritability. Psychosis. A feeling of fluttering of the heart (palpitations). Seizures. These symptoms are more severe if magnesium levels drop suddenly. How is this diagnosed? This condition may be diagnosed based on: Your symptoms and medical history. A physical exam. Blood and urine tests. How is this treated? Treatment depends on the cause and the severity of the condition. It may be treated by: Taking a magnesium supplement. This can be taken in pill form. If the condition is severe, magnesium is usually given through an IV. Making changes to your diet. You may be directed to eat foods that have a lot of magnesium, such as green leafy vegetables, peas, beans, and nuts. Not drinking alcohol. If you are struggling not to drink, ask your health care provider for help. Follow these instructions at  home: Eating and drinking     Make sure that your diet includes foods with magnesium. Foods that have a lot of magnesium in them include: Green leafy vegetables, such as spinach and broccoli. Beans and peas. Nuts and seeds, such as almonds and sunflower seeds. Whole grains, such as whole grain bread and fortified cereals. Drink fluids that contain salts and minerals (electrolytes), such as sports drinks, when you are active. Do not drink alcohol. General instructions Take over-the-counter and prescription medicines only as told by your health care provider. Take magnesium supplements as directed if your health care provider tells you to take them. Have your magnesium levels monitored as told by your health care provider. Keep all follow-up visits. This is important. Contact a health care provider if: You get worse instead of better. Your symptoms return. Get help right away if: You develop severe muscle weakness. You have trouble breathing. You feel that your heart is racing. These symptoms may represent a serious problem that is an emergency. Do not wait to see if the symptoms will go away. Get medical help right away. Call your local emergency services (911 in the [place] Do not drive yourself to the hospital. Summary Hypomagnesemia is a condition in which the level of magnesium in the blood is too low. Hypomagnesemia can affect every organ in the body. Treatment may include eating more foods that contain magnesium, taking magnesium supplements, and not drinking alcohol. Have your magnesium levels monitored as told by your health care provider. This information is not intended to replace advice given to you by your health care provider. Make sure you discuss any questions you have with your health care provider. Document Revised: 06/18/2020 Document Reviewed: 06/18/2020 Elsevier Patient Education    2023 Elsevier Inc.  

## 2022-03-16 ENCOUNTER — Inpatient Hospital Stay: Payer: Medicare Other

## 2022-03-16 ENCOUNTER — Other Ambulatory Visit: Payer: Self-pay

## 2022-03-16 ENCOUNTER — Ambulatory Visit: Payer: Medicare Other

## 2022-03-16 ENCOUNTER — Inpatient Hospital Stay (HOSPITAL_BASED_OUTPATIENT_CLINIC_OR_DEPARTMENT_OTHER): Payer: Medicare Other | Admitting: Physician Assistant

## 2022-03-16 ENCOUNTER — Other Ambulatory Visit (HOSPITAL_COMMUNITY): Payer: Self-pay

## 2022-03-16 DIAGNOSIS — Z79818 Long term (current) use of other agents affecting estrogen receptors and estrogen levels: Secondary | ICD-10-CM | POA: Diagnosis not present

## 2022-03-16 DIAGNOSIS — Z170 Estrogen receptor positive status [ER+]: Secondary | ICD-10-CM | POA: Diagnosis not present

## 2022-03-16 DIAGNOSIS — C7951 Secondary malignant neoplasm of bone: Secondary | ICD-10-CM

## 2022-03-16 DIAGNOSIS — C50212 Malignant neoplasm of upper-inner quadrant of left female breast: Secondary | ICD-10-CM | POA: Diagnosis not present

## 2022-03-16 DIAGNOSIS — Z5111 Encounter for antineoplastic chemotherapy: Secondary | ICD-10-CM | POA: Diagnosis not present

## 2022-03-16 LAB — CMP (CANCER CENTER ONLY)
ALT: 11 U/L (ref 0–44)
AST: 17 U/L (ref 15–41)
Albumin: 4 g/dL (ref 3.5–5.0)
Alkaline Phosphatase: 58 U/L (ref 38–126)
Anion gap: 9 (ref 5–15)
BUN: 28 mg/dL — ABNORMAL HIGH (ref 8–23)
CO2: 19 mmol/L — ABNORMAL LOW (ref 22–32)
Calcium: 9.3 mg/dL (ref 8.9–10.3)
Chloride: 111 mmol/L (ref 98–111)
Creatinine: 1.21 mg/dL — ABNORMAL HIGH (ref 0.44–1.00)
GFR, Estimated: 45 mL/min — ABNORMAL LOW (ref >60)
Glucose, Bld: 110 mg/dL — ABNORMAL HIGH (ref 70–99)
Potassium: 4.2 mmol/L (ref 3.5–5.1)
Sodium: 139 mmol/L (ref 135–145)
Total Bilirubin: 0.2 mg/dL — ABNORMAL LOW (ref 0.3–1.2)
Total Protein: 7.1 g/dL (ref 6.5–8.1)

## 2022-03-16 LAB — MAGNESIUM: Magnesium: 1.6 mg/dL — ABNORMAL LOW (ref 1.7–2.4)

## 2022-03-16 MED ORDER — MAGNESIUM SULFATE 2 GM/50ML IV SOLN
2.0000 g | Freq: Once | INTRAVENOUS | Status: AC
  Administered 2022-03-16: 2 g via INTRAVENOUS
  Filled 2022-03-16: qty 50

## 2022-03-16 NOTE — Patient Instructions (Signed)
Magnesium Sulfate Injection What is this medication? MAGNESIUM SULFATE (mag NEE zee um SUL fate) prevents and treats low levels of magnesium in your body. It may also be used to prevent and treat seizures during pregnancy in people with high blood pressure disorders, such as preeclampsia or eclampsia. Magnesium plays an important role in maintaining the health of your muscles and nervous system. This medicine may be used for other purposes; ask your health care provider or pharmacist if you have questions. What should I tell my care team before I take this medication? They need to know if you have any of these conditions: Heart disease History of irregular heart beat Kidney disease An unusual or allergic reaction to magnesium sulfate, medications, foods, dyes, or preservatives Pregnant or trying to get pregnant Breast-feeding How should I use this medication? This medication is for infusion into a vein. It is given in a hospital or clinic setting. Talk to your care team about the use of this medication in children. While this medication may be prescribed for selected conditions, precautions do apply. Overdosage: If you think you have taken too much of this medicine contact a poison control center or emergency room at once. NOTE: This medicine is only for you. Do not share this medicine with others. What if I miss a dose? This does not apply. What may interact with this medication? Certain medications for anxiety or sleep Certain medications for seizures, such phenobarbital Digoxin Medications that relax muscles for surgery Narcotic medications for pain This list may not describe all possible interactions. Give your health care provider a list of all the medicines, herbs, non-prescription drugs, or dietary supplements you use. Also tell them if you smoke, drink alcohol, or use illegal drugs. Some items may interact with your medicine. What should I watch for while using this  medication? Your condition will be monitored carefully while you are receiving this medication. You may need blood work done while you are receiving this medication. What side effects may I notice from receiving this medication? Side effects that you should report to your care team as soon as possible: Allergic reactions--skin rash, itching, hives, swelling of the face, lips, tongue, or throat High magnesium level--confusion, drowsiness, facial flushing, redness, sweating, muscle weakness, fast or irregular heartbeat, trouble breathing Low blood pressure--dizziness, feeling faint or lightheaded, blurry vision Side effects that usually do not require medical attention (report to your care team if they continue or are bothersome): Headache Nausea This list may not describe all possible side effects. Call your doctor for medical advice about side effects. You may report side effects to FDA at 1-800-FDA-1088. Where should I keep my medication? This medication is given in a hospital or clinic and will not be stored at home. NOTE: This sheet is a summary. It may not cover all possible information. If you have questions about this medicine, talk to your doctor, pharmacist, or health care provider.  2023 Elsevier/Gold Standard (2012-05-27 00:00:00)

## 2022-03-16 NOTE — Progress Notes (Addendum)
Symptom Management Consult note Stacey [name]    Patient Care Team: Biagio Borg, MD as PCP - General (Internal Medicine) Tyler Pita, MD as Consulting Physician (Radiation Oncology) Roseanne Kaufman, MD as Consulting Physician (Orthopedic Surgery) Lyndal Pulley, DO as Consulting Physician (Family Medicine) Monna Fam, MD as Consulting Physician (Ophthalmology) Delice Bison Charlestine Massed, NP as Nurse Practitioner (Hematology and Oncology)    Name / MRN / DOB: Stacey [name]  [id number]  1940/12/01   Date of visit: 03/16/2022   Chief Complaint/Reason for visit: lab check   Current Therapy: Anastrozole, Palbociclib, Fulvestrant      ASSESSMENT & PLAN: Patient is a 82 y.o. female  with oncologic history of metastatic breast cancer followed by Dr. Chryl Heck.  I have viewed most recent oncology note and lab work.    #Metastatic breast cancer - Next appointment with oncologist is 04/10/22   #Hypomagnesemia -Labwork on Friday 02/09 with Mag 0.9. Patient is chronically low usually around 1.0. Patient received 4g IV Magnesium on 02/10. -Today labs show magnesium is 1.6.  As patient continues to have diarrhea she would benefit from receiving IV mag again today.  The patient will be given 2 g here in clinic.  She is agreeable with this plan. No medication changes will be made. -CMP without significant electrolyte derangement. Renal function slightly elevated compared to labs x 1 month ago when it was 1.11. Baseline appears to be around 1.2. Patient declines IVF and would rather push PO intake at home. -Oncologist informed of plan and also agrees.    Strict ED precautions discussed should symptoms worsen.     Heme/Onc History: Oncology History  Malignant neoplasm of upper-inner quadrant of left breast in female, estrogen receptor positive ([place])  11/1995 Cancer Diagnosis   History of IDC of left breast, grade 2, ER/PR+, 1/15 LN positive for malignancy, s/p  doxorubicin and cyclophosphamide x 4 cycles followed by adjuvant RT to left breast and adjuvant tamoxifen x 7 years [name])   11/14/2010 Initial Biopsy   Right breast needle core bx: IMC/MCIS, grade 2 , ER/PR+, HER2/neu negative   12/2010 -  Neo-Adjuvant Anti-estrogen oral therapy   letrozole 2.5 mg daily; changed to tamoxifen 20 mg daily Feb, 2013 due to osteopenia   09/03/2011 Pathologic Stage   Stage IIB: ypT2 ypN1a   09/03/2011 Definitive Surgery   Right lumpectomy/SLNB: ILC with calcifications, grade 2, 1 sentinel LN positive for malignancy   10/19/2011 - 12/03/2011 Radiation Therapy   Adjuvant RT to right breast [name])   06/15/2019 Genetic Testing   Negative genetic testing. No pathogenic variants identified on the [facility] Common Hereditary Cancers Panel. The report date is 06/15/2019.  The Common Hereditary Cancers Panel offered by [facility] includes sequencing and/or deletion duplication testing of the following 48 genes: APC, ATM, AXIN2, BARD1, BMPR1A, BRCA1, BRCA2, BRIP1, CDH1, CDKN2A (p14ARF), CDKN2A (p16INK4a), CKD4, CHEK2, CTNNA1, DICER1, EPCAM (Deletion/duplication testing only), GREM1 (promoter region deletion/duplication testing only), KIT, MEN1, MLH1, MSH2, MSH3, MSH6, MUTYH, NBN, NF1, NHTL1, PALB2, PDGFRA, PMS2, POLD1, POLE, PTEN, RAD50, RAD51C, RAD51D, RNF43, SDHB, SDHC, SDHD, SMAD4, SMARCA4. STK11, TP53, TSC1, TSC2, and VHL.  The following genes were evaluated for sequence changes only: SDHA and HOXB13 c.251G>A variant only.       Interval history-: Stacey [name] is a 82 y.o. female with oncologic history as above presenting to [facility] today with chief complaint of low magnesium.  Patient had labwork on Friday and magnesium was 0.9. She had already left prior  to knowing lab results. Patient was contacted and offered ED evaluation vs coming in for magnesium infusion the next day. Patient received 4g IV magnesium on Saturday. She returns today to have repeat labs and more IV  magnesium if needed.  Patient states leading up to her lab work on Friday she had approximately 2 weeks of frequent diarrhea.  She had at least 10 episodes per day.  Stool was complete liquid.  She continues to take PO magnesium at home as prescribed. Patient states she had very poor diet because of the frequent diarrhea as well as her ongoing dental issues.  Patient is currently undergoing dental and oral surgeon eval about implants given her multiple missing teeth secondary to Xgeva she tells me.  She states because she is missing so many teeth that she can really only eat soft foods.    Patient endorses having 4 bowel movements daily times the last 3 days.  She took 2 Imodium this morning.  Stool consistency and frequency are back to "normal" for her. She denies any associated abdominal pain.  No urinary symptoms or fever.  Patient states she noticed that she has been more fatigued than usual lately and that she felt as if her energy levels increased after the infusion on Saturday.  Patient denies any recent antibiotic use, travel or sick contacts.  Chart review shows oncologist has documented that patient continues to have severe hypomagnesemia and continues to take oral magnesium supplements although has to limit it with her ongoing diarrhea.    ROS  All other systems are reviewed and are negative for acute change except as noted in the HPI.    Allergies  Allergen Reactions   Aminoglycosides Other (See Comments)    Unknown reaction - pt is not sure where this entry came from   Bacitracin Other (See Comments)   Bee Venom Swelling    Severe body swelling   Cephalexin Other (See Comments)   Clindamycin/Lincomycin Diarrhea and Nausea And Vomiting   Codeine Nausea And Vomiting    Pt can take codeine cough syrup.     Past Medical History:  Diagnosis Date   ANEMIA-NOS    ANXIETY    Breast cancer ([place]) 1997 L, 2012 R   s/p chemo/xrt   COPD    resolved   DIVERTICULOSIS, COLON 2008    Dizziness    Family history of breast cancer    Family history of lung cancer    Family history of lymphoma    Family history of pancreatic cancer    Family history of uterine cancer    GERD    Hx of radiation therapy 10/19/11 -12/03/11   right breast   HYPERLIPIDEMIA    IRRITABLE BOWEL SYNDROME, HX OF    Left-sided carotid artery disease (HCC)    moderate left ICA stenosis   OSTEOARTHRITIS, HAND    PSVT (paroxysmal supraventricular tachycardia)    symptomatic on event monitor     Past Surgical History:  Procedure Laterality Date   ABDOMINAL HYSTERECTOMY     APPENDECTOMY     BREAST BIOPSY  11/14/10    r breast: inv, insitu mammary carcinoma w/calcif, er/pr +, her2 -   BREAST SURGERY     lumpectomy   CATARACT EXTRACTION     both eyes   ELECTROPHYSIOLOGIC STUDY N/A 05/10/2015   Procedure: SVT Ablation;  Surgeon: Will Meredith Leeds, MD;  Location: [facility];  Service: Cardiovascular;  Laterality: N/A;   HERNIA REPAIR  inguinal herniorrhapy  1984   left   IR US GUIDE BX ASP/DRAIN  05/26/2019   rectal fissure repair     s/p benign breast biopsy  2003   right   s/p left foot surgury  2009   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   spiral fx left foot  2008   no surgury   TMJ ARTHROPLASTY  1989   TONGUE SURGERY     1988- to remove scar tissue growth    TONSILLECTOMY      Social History   Socioeconomic History   Marital status: Divorced    Spouse name: 2 Step-children   Number of children: 2   Years of education: Not on file   Highest education level: Not on file  Occupational History   Occupation: retired Banker  Tobacco Use   Smoking status: Former    Packs/day: 1.00    Years: 54.00    Total pack years: 54.00    Types: Cigarettes    Quit date: 01/03/2016    Years since quitting: 6.2   Smokeless tobacco: Never  Vaping Use   Vaping Use: Never used  Substance and Sexual Activity   Alcohol use: Yes    Comment: rare/ drinks socially   Drug  use: No   Sexual activity: Never  Other Topics Concern   Not on file  Social History Narrative   Patient gets no regular exercise   No biological children   2 step children   Social Determinants of Health   Financial Resource Strain: Low Risk  (12/02/2021)   Overall Financial Resource Strain (CARDIA)    Difficulty of Paying Living Expenses: Not hard at all  Food Insecurity: No Food Insecurity (12/02/2021)   Hunger Vital Sign    Worried About Running Out of Food in the Last Year: Never true    Ran Out of Food in the Last Year: Never true  Transportation Needs: No Transportation Needs (12/02/2021)   PRAPARE - Hydrologist (Medical): No    Lack of Transportation (Non-Medical): No  Physical Activity: Sufficiently Active (12/02/2021)   Exercise Vital Sign    Days of Exercise per Week: 5 days    Minutes of Exercise per Session: 30 min  Stress: No Stress Concern Present (12/02/2021)   [place]    Feeling of Stress : Only a little  Social Connections: Socially Isolated (12/02/2021)   Social Connection and Isolation Panel [NHANES]    Frequency of Communication with Friends and Family: More than three times a week    Frequency of Social Gatherings with Friends and Family: More than three times a week    Attends Religious Services: Never    Marine scientist or Organizations: No    Attends Archivist Meetings: Never    Marital Status: Divorced  Human resources officer Violence: Not At Risk (12/02/2021)   Humiliation, Afraid, Rape, and Kick questionnaire    Fear of Current or Ex-Partner: No    Emotionally Abused: No    Physically Abused: No    Sexually Abused: No    Family History  Problem Relation Age of Onset   Hypertension Mother    Stroke Mother    Colon polyps Mother    Diabetes Mother    Pancreatic cancer Mother 38   Uterine cancer Mother 55   Lymphoma Brother         burkitts  Lung cancer Paternal Uncle    Lung cancer Maternal Grandmother 23       non-smoker   Lung cancer Maternal Grandfather    Breast cancer Cousin        maternal cousin, dx in her mid 43s   Brain cancer Cousin        maternal cousin's son; dx in his 60s   Testicular cancer Cousin        maternal cousin's son;    Breast cancer Cousin        paternal cousin; dx in her 40s   Breast cancer Cousin        paternal cousin's daughter; dx in 50s; neg genetic testing   Colon cancer Neg Hx    Esophageal cancer Neg Hx    Stomach cancer Neg Hx    Rectal cancer Neg Hx      Current Outpatient Medications:    acetaminophen (TYLENOL) 500 MG tablet, Take 1,000 mg by mouth See admin instructions. Take 2 tablets (1000 mg) by mouth daily at bedtime, may also take 2 tablets (1000 mg) two more times during the day or night as needed for pain, Disp: , Rfl:    anastrozole (ARIMIDEX) 1 MG tablet, TAKE 1 TABLET(1 MG) BY MOUTH DAILY, Disp: 90 tablet, Rfl: 0   aspirin 81 MG EC tablet, Take 81 mg by mouth every morning., Disp: , Rfl:    CALCIUM CARBONATE-VITAMIN D PO, Take 1 tablet by mouth 2 (two) times daily., Disp: , Rfl:    Cholecalciferol (VITAMIN D3 PO), Take 1 capsule by mouth every morning., Disp: , Rfl:    cholestyramine (QUESTRAN) 4 g packet, MIX AND DRINK 1 PACKET(4 GRAMS) BY MOUTH THREE TIMES DAILY (Patient not taking: Reported on 12/02/2021), Disp: 60 each, Rfl: 12   clotrimazole-betamethasone (LOTRISONE) cream, Apply 1 application topically 2 (two) times daily as needed. (Patient taking differently: Apply 1 application  topically 2 (two) times daily as needed (rash/yeast in corners of mouth).), Disp: 30 g, Rfl: 1   diphenhydrAMINE (BENADRYL) 25 MG tablet, Take 25 mg by mouth every 6 (six) hours as needed (for bee stings). , Disp: , Rfl:    fulvestrant (FASLODEX) 250 MG/5ML injection, Inject 250 mg into the muscle every 28 (twenty-eight) days., Disp: , Rfl:    gabapentin (NEURONTIN) 100 MG  capsule, TAKE 1 CAPSULE(100 MG) BY MOUTH AT BEDTIME, Disp: 20 capsule, Rfl: 0   loperamide (IMODIUM) 2 MG capsule, 2-4 mg See admin instructions. Take 2 tablets (4 mg) by mouth for first loose stool, then 1 tablet (2 mg) for every loose stool thereafter. Do not exceed 8 tablets (16 mg) in a 24 hour period. Stop loperamide if 12 hours have passed without a loose stool., Disp: 30 capsule, Rfl:    loratadine (CLARITIN) 10 MG tablet, Take 10 mg by mouth every morning., Disp: , Rfl:    Magnesium 500 MG CAPS, Take 500 mg by mouth every morning. Take with a 250 mg capsule for a total dose of 750 mg daily, Disp: , Rfl:    MAGNESIUM PO, Take 250 mg by mouth every morning. Take with a 500 mg capsule for a total dose of 750 mg daily, Disp: , Rfl:    Multiple Vitamin (MULTIVITAMIN WITH MINERALS) TABS tablet, Take 1 tablet by mouth every morning. Centrum, Disp: , Rfl:    omeprazole (PRILOSEC) 20 MG capsule, TAKE 1 CAPSULE BY MOUTH DAILY, Disp: 90 capsule, Rfl: 1   palbociclib (IBRANCE) 100 MG tablet, Take 1 tablet (  100 mg total) by mouth Monday through Friday. Take as directed by MD., Disp: 21 tablet, Rfl: 6   prochlorperazine (COMPAZINE) 10 MG tablet, Take 1 tablet (10 mg total) by mouth every 6 (six) hours as needed for nausea or vomiting., Disp: 30 tablet, Rfl: 1   telmisartan (MICARDIS) 40 MG tablet, Take 1 tablet (40 mg total) by mouth every morning., Disp: 90 tablet, Rfl: 3   Tiotropium Bromide-Olodaterol (STIOLTO RESPIMAT) 2.5-2.5 MCG/ACT AERS, Inhale 2 puffs into the lungs daily., Disp: 1 each, Rfl: 11   triamcinolone (NASACORT) 55 MCG/ACT AERO nasal inhaler, Place 2 sprays into the nose daily. 2 sprays each nostril at night before bedtime (Patient taking differently: Place 2 sprays into the nose See admin instructions. Instill 2 sprays into each nostril every night before bedtime), Disp: 1 each, Rfl: 12   triamterene-hydrochlorothiazide (DYAZIDE) 37.5-25 MG capsule, TAKE 1 CAPSULE BY MOUTH EVERY MORNING  (Patient taking differently: Take 1 capsule by mouth every morning.), Disp: 90 capsule, Rfl: 4   zolpidem (AMBIEN) 10 MG tablet, TAKE 1 TABLET(10 MG) BY MOUTH AT BEDTIME AS NEEDED, Disp: 90 tablet, Rfl: 1  PHYSICAL EXAM: ECOG FS:1 - Symptomatic but completely ambulatory    Vitals:   03/16/22 1106  BP: (!) 121/52  Pulse: 85  Resp: 20  Temp: 98.5 F (36.9 C)  TempSrc: Oral  SpO2: 99%   Physical Exam Vitals and nursing note reviewed.  Constitutional:      Appearance: She is well-developed. She is not ill-appearing or toxic-appearing.  HENT:     Head: Normocephalic.     Nose: Nose normal.     Mouth/Throat:     Mouth: Mucous membranes are moist.  Eyes:     Conjunctiva/sclera: Conjunctivae normal.  Neck:     Vascular: No JVD.  Cardiovascular:     Rate and Rhythm: Normal rate and regular rhythm.     Pulses: Normal pulses.     Heart sounds: Normal heart sounds.  Pulmonary:     Effort: Pulmonary effort is normal.     Breath sounds: Normal breath sounds.  Abdominal:     General: There is no distension.  Musculoskeletal:     Cervical back: Normal range of motion.  Skin:    General: Skin is warm and dry.  Neurological:     Mental Status: She is oriented to person, place, and time.        LABORATORY DATA: I have reviewed the data as listed    Latest Ref Rng & Units 03/13/2022    1:27 PM 02/12/2022    1:13 PM 01/16/2022    2:45 PM  CBC  WBC 4.0 - 10.5 K/uL 3.0  2.4  2.7   Hemoglobin 12.0 - 15.0 g/dL 8.9  9.4  9.1   Hematocrit 36.0 - 46.0 % 25.7  28.4  27.0   Platelets 150 - 400 K/uL 135  112  132         Latest Ref Rng & Units 03/16/2022   10:40 AM 03/13/2022    1:27 PM 02/12/2022    1:13 PM  CMP  Glucose 70 - 99 mg/dL 110  108  93   BUN 8 - 23 mg/dL 28  37  31   Creatinine 0.44 - 1.00 mg/dL 1.21  1.37  1.11   Sodium 135 - 145 mmol/L 139  137  139   Potassium 3.5 - 5.1 mmol/L 4.2  3.9  4.2   Chloride 98 - 111 mmol/L 111  110  110   CO2 22 - 32 mmol/L 19  17  17    $ Calcium 8.9 - 10.3 mg/dL 9.3  8.9  9.8   Total Protein 6.5 - 8.1 g/dL 7.1  7.6  7.9   Total Bilirubin 0.3 - 1.2 mg/dL 0.2  0.3  0.3   Alkaline Phos 38 - 126 U/L 58  54  59   AST 15 - 41 U/L 17  17  17   $ ALT 0 - 44 U/L 11  11  11        $ RADIOGRAPHIC STUDIES (from last 24 hours if applicable) I have personally reviewed the radiological images as listed and agreed with the findings in the report. No results found.      Visit Diagnosis: 1. Hypomagnesemia   2. 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HCC)      No orders of the defined types were placed in this encounter.   All questions were answered. The patient knows to call the clinic with any problems, questions or concerns. No barriers to learning was detected.  I have spent a total of 20 minutes minutes of face-to-face and non-face-to-face time, preparing to see the patient, obtaining and/or reviewing separately obtained history, performing a medically appropriate examination, counseling and educating the patient, ordering tests, documenting clinical information in the electronic health record, and care coordination (communications with other health care professionals or caregivers).    Thank you for allowing me to participate in the care of this patient.    Barrie Folk, PA-C Department of Hematology/Oncology [facility] at [facility] Phone: [phone]  [phone]    03/16/2022 1:00 PM

## 2022-03-17 ENCOUNTER — Encounter (HOSPITAL_COMMUNITY)
Admission: RE | Admit: 2022-03-17 | Discharge: 2022-03-17 | Disposition: A | Discharge status: Home / Self Care | Payer: Medicare Other | Source: Ambulatory Visit | Attending: Hematology and Oncology | Admitting: Hematology and Oncology

## 2022-03-17 DIAGNOSIS — C50919 Malignant neoplasm of unspecified site of unspecified female breast: Secondary | ICD-10-CM | POA: Diagnosis not present

## 2022-03-17 DIAGNOSIS — K8020 Calculus of gallbladder without cholecystitis without obstruction: Secondary | ICD-10-CM | POA: Diagnosis not present

## 2022-03-17 DIAGNOSIS — C50212 Malignant neoplasm of upper-inner quadrant of left female breast: Secondary | ICD-10-CM | POA: Insufficient documentation

## 2022-03-17 DIAGNOSIS — Z170 Estrogen receptor positive status [ER+]: Secondary | ICD-10-CM | POA: Insufficient documentation

## 2022-03-17 DIAGNOSIS — K449 Diaphragmatic hernia without obstruction or gangrene: Secondary | ICD-10-CM | POA: Diagnosis not present

## 2022-03-17 DIAGNOSIS — I2510 Atherosclerotic heart disease of native coronary artery without angina pectoris: Secondary | ICD-10-CM | POA: Diagnosis not present

## 2022-03-17 LAB — GLUCOSE, CAPILLARY: Glucose-Capillary: 101 mg/dL — ABNORMAL HIGH (ref 70–99)

## 2022-03-17 MED ORDER — FLUDEOXYGLUCOSE F - 18 (FDG) INJECTION
8.1300 | Freq: Once | INTRAVENOUS | Status: AC | PRN
  Administered 2022-03-17: 8.13 via INTRAVENOUS

## 2022-03-19 ENCOUNTER — Ambulatory Visit: Payer: Medicare Other

## 2022-03-19 DIAGNOSIS — I890 Lymphedema, not elsewhere classified: Secondary | ICD-10-CM

## 2022-03-19 DIAGNOSIS — C44619 Basal cell carcinoma of skin of left upper limb, including shoulder: Secondary | ICD-10-CM | POA: Diagnosis not present

## 2022-03-19 DIAGNOSIS — C44629 Squamous cell carcinoma of skin of left upper limb, including shoulder: Secondary | ICD-10-CM

## 2022-03-19 NOTE — Therapy (Signed)
OUTPATIENT PHYSICAL THERAPY ONCOLOGY TREATMENT  Patient Name: Stacey [name] MRN: [id number] DOB:07-19-1940, 82 y.o., female Today's Date: 03/19/2022     PT End of Session - 03/19/22 1514     Visit Number 26    Number of Visits 31    Date for PT Re-Evaluation 04/16/22    PT Start Time 1513    PT Stop Time 1610    PT Time Calculation (min) 57 min    Activity Tolerance Patient tolerated treatment well    Behavior During Therapy [facility] for tasks assessed/performed               Past Medical History:  Diagnosis Date   ANEMIA-NOS    ANXIETY    Breast cancer ([place]) 1997 L, 2012 R   s/p chemo/xrt   COPD    resolved   DIVERTICULOSIS, COLON 2008   Dizziness    Family history of breast cancer    Family history of lung cancer    Family history of lymphoma    Family history of pancreatic cancer    Family history of uterine cancer    GERD    Hx of radiation therapy 10/19/11 -12/03/11   right breast   HYPERLIPIDEMIA    IRRITABLE BOWEL SYNDROME, HX OF    Left-sided carotid artery disease (HCC)    moderate left ICA stenosis   OSTEOARTHRITIS, HAND    PSVT (paroxysmal supraventricular tachycardia)    symptomatic on event monitor   Past Surgical History:  Procedure Laterality Date   ABDOMINAL HYSTERECTOMY     APPENDECTOMY     BREAST BIOPSY  11/14/10    r breast: inv, insitu mammary carcinoma w/calcif, er/pr +, her2 -   BREAST SURGERY     lumpectomy   CATARACT EXTRACTION     both eyes   ELECTROPHYSIOLOGIC STUDY N/A 05/10/2015   Procedure: SVT Ablation;  Surgeon: Will Meredith Leeds, MD;  Location: [facility];  Service: Cardiovascular;  Laterality: N/A;   HERNIA REPAIR     inguinal herniorrhapy  1984   left   IR US GUIDE BX ASP/DRAIN  05/26/2019   rectal fissure repair     s/p benign breast biopsy  2003   right   s/p left foot surgury  2009   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   spiral fx left foot  2008   no surgury   TMJ ARTHROPLASTY   1989   TONGUE SURGERY     1988- to remove scar tissue growth    TONSILLECTOMY     Patient Active Problem List   Diagnosis Date Noted   Acute upper respiratory infection 05/16/2021   Nasal dryness 02/04/2021   Chronic sinusitis 11/25/2020   Allergic rhinitis 07/08/2020   Hypomagnesemia 06/10/2020   Goals of care, counseling/discussion 06/21/2019   Family history of breast cancer    Family history of pancreatic cancer    Family history of uterine cancer    Family history of lymphoma    Family history of lung cancer    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[place]) 06/02/2019   Recurrent cancer of left breast ([place]) 05/15/2019   Pes anserine bursitis 03/31/2019   HTN (hypertension) 02/10/2019   Hyperglycemia 06/21/2018   Left carpal tunnel syndrome 02/22/2018   Rotator cuff arthropathy of left shoulder 09/20/2017   Arthritis of hand 08/23/2017   Pain in right hand 08/23/2017   Cervical radiculitis 04/09/2017  Left shoulder pain 04/09/2017   Dyspnea on exertion 05/14/2016   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   Lymphedema of left arm 01/14/2016   Left arm swelling 12/25/2015   SVT (supraventricular tachycardia)    Left-sided carotid artery disease ([place]) 03/08/2015   Dizziness 01/24/2015   Urinary frequency 01/13/2015   Dizziness and giddiness 01/09/2015   Gallstones 02/21/2013   Malignant neoplasm of upper-inner quadrant of left breast in female, estrogen receptor positive ([place]) 11/28/2012   Muscle cramping 09/12/2012   Right hip pain 09/12/2012   Lower back pain 09/12/2012   COPD GOLD I     Hx of radiation therapy    Right shoulder pain 09/14/2011   Preventative health care 07/29/2010   Skin lesion of left leg 07/29/2010   Paresthesia 07/29/2010   GERD 03/28/2010   CONSTIPATION 03/28/2010   Osteoarthrosis, 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   Anxiety state 05/03/2009   Hyperlipidemia 06/14/2007   FATIGUE 06/14/2007   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  Rationale for Evaluation and Treatment Rehabilitation  SUBJECTIVE:                                                                                                                                                                                          SUBJECTIVE STATEMENT:  Had my blood work last week and my magnesium was really low. I had to have 2 infusions last week of maagnesium.My arm is doing well but my hand is swollen. I have not washed the garments yet. I have trouble getting it all the way pulled up.   PERTINENT HISTORY:  Hx breast cancer with 15 lymph node removal on the L and Bil lumpectomy. Lumpectomy on the L in 1997and the R in 2012. Radiation Bil.  New discovery of metastatic breast Cancer presently on Palbocidib and Anastrozole,  Fulvestrant since 06/2019.Recent PET scan on 10/09/2021 showed no evidence of recurrent or metastatic disease. Prior lymphedema treatment for Left UE in 2021  PAIN:  Are you having pain? No pain today NPRS scale: pt did not rate on scale Pain location: wrist and hand on left  Pain orientation: Left  PAIN TYPE: stabbing Pain description: intermittent  Aggravating factors: evening Relieving factors: Tribute wrap but doesn't wear the  hand piece.  PRECAUTIONS: Other: Left UE lymphedema , Metastatic breast Cancer, COPD  WEIGHT BEARING RESTRICTIONS: No  FALLS:  Has patient fallen in last 6 months? No  LIVING ENVIRONMENT: Lives with: lives alone Lives in: House/apartment Stairs: No; External: 1 steps; on right going up Has following equipment at home:  None  OCCUPATION: retired  LEISURE: reading, puzzles, walks very little  HAND DOMINANCE: right   PRIOR LEVEL OF FUNCTION: Independent  PATIENT GOALS: Try and getting swelling in left arm down. Get a new compression sleeve that fits properly   OBJECTIVE:  COGNITION: Overall cognitive status: Within functional limits for tasks assessed   PALPATION: Mild pitting edema ulnar border of Left UE  OBSERVATIONS / OTHER ASSESSMENTS: Bandaid on forearm with small amount of clear  drainage showing through. Other aspect of incision that can be viewed is healed. Sensation to light touch in the hand is intact. Dorsum of hand is slightly swollen, and upper arm swollen above where she had TG soft folded over and with bottom folded over several inches, may be making hand swell. She complains of left wrist pain worse at night and has a weak left grip  SENSATION: Light touch: Appears intact throughout left UE   POSTURE: forward head, rounded shoulders  CERVICAL AROM: All within functional limits:   UPPER EXTREMITY STRENGTH: NT  LYMPHEDEMA ASSESSMENTS:   SURGERY TYPE/DATE: Left breast Lumpectomy 1997, Right lumpectomy 2012  NUMBER OF LYMPH NODES REMOVED: 15 on left UE  CHEMOTHERAPY:Yes, presently on medications for metastasis  RADIATION:bilateral   HORMONE TREATMENT: yes  INFECTIONS: yes  LYMPHEDEMA ASSESSMENTS:   LANDMARK RIGHT  eval  10 cm proximal to olecranon process 27.5  Olecranon process 22.4  10 cm proximal to ulnar styloid process 19.0  Just proximal to ulnar styloid process 15.2  Across hand at thumb web space 17.9  At base of 2nd digit 6.3  (Blank rows = not tested)  LANDMARK LEFT  eval LEFT 12/18/2021 LEFT 12/24/2021 LEFT 12/29/21 LEFT 01/01/2022 LEFT 01/20/2022 LEFT 01/29/2022 LEFT 02/04/2022 LEFT 02/09/2022 LEFT 02/19/2022 LEFT 03/05/2022 LEFT 03/19/2022  10 cm proximal to olecranon process 28.2 29.0 28.6 28.5 28 28.3 28.0 29.0 29.0 29.2 29.3 29  Olecranon  process 27.5 25.3 25.6 25.5 25.6 25.5 24.5 25.0 25 25.3 24.9 25.0  10 cm proximal to ulnar styloid process 24.0 21 21.6 21.8 21.3 21.3 20.6 21.0 20.5 20.5 21.0 22.0  Just proximal to ulnar styloid process 18.2 17.7 18.2 17.5 17.7 17.8 16.7 17.0 17.3 17.3 17.5 17.5  Across hand at thumb web space 18.5 18.0 18.3 17.5 17.7 17.6 17.7 17.8 17.7 17.6 17.3 17.6  At base of 2nd digit 6.5 6.3 6.3 6.4 6.3 6.35 6.2 6.2 6.2 6.2 6.1 6.0  (Blank rows = not tested)  Wrist   16.4     Elbow  26           axilla  29.2,     MCPS17.3    wrist 16.8  (SLEEVE MEASUREMENTS)    LLIS : 47%   TODAY'S TREATMENT:  DATE 03/19/2022 Pt arrives wearing sleve and glove but not pulled all the way up and folded over slightly at the top. Garments removed and pts arm observed. Mild swelling at dorsum of hand and visible swelling just above wrist to just below 10 cm mark. Pt was remeasured and most measurements fairly stable except increased at 10 cm, and slightly at hand. Discussed importance of laundering garments every couple of days so they hold swelling better. She is still awaiting the proper glove. Performed Manual lymph drainage in supine as follows: short neck, (no diaphragmatic breathing due to shortness of breath from medication) , left inguinal nodes, left axillo-inguinal anastamoses. Left upper extremity working proximal to distal then retracing all steps being very careful around area on forearm where pt recently had surgery. Reviewed with pt  how to turn the top down over the sleeve and stretch the upper arm part over the arm [name] being careful not to stretch the wrist part over the [place], and how to slide arm in and use opposite hand to assist sleeve over wrist prn. Pt was able to get arm most of the way up and therapist assisted her with getting out wrinkles as she was very fatigued. Discussed pt  trying the over sleeve with her night garment and to check her arm and hand swelling when she removes the night garment to be sure it is taking down the swelling that may arise during the day.   03/05/2022 Removed sleeve and Medi Harmony glove and remeasured pt. No swelling in the hand, and the arm looks good as well. The sleeve was about an inch too low at the top and the wrist on the sleeve is large.  Pt did indicate that she struggled with the arm butler and actually tried to stretch the wrist over it which may have stretched it out. She will wash the sleeve tonight or tomorrow to try and tighten it up some. Manual lymph drainage in supine as follows: short neck, (no diaphragmatic breathing due to shortness of breath from medication) , left inguinal nodes, left axillo-inguinal anastamoses. Left upper extremity working proximal to distal then retracing all steps being very careful around area on forearm where pt recently had surgery. Pt shown how to turn the top down over the sleeve and stretch the upper arm part over the arm [place]. She was able to do that much more easily since she was trying before to stretch the wrist over it. She was able to get her arm most of the way into the sleeve with the arm butler, but did not quite get it over her wrist to start, and was about an inch too low.  Able to correct with rubber glove and slight assist from PT. Pt took pictures of the sleeve on the arm butler with her phone.  03/02/2022 Manual lymph drainage in supine as follows: short neck, (no diaphragmatic breathing due to shortness of breath from medication) , left inguinal nodes, left axillo-inguinal anastamoses. Left upper extremity working proximal to distal then retracing all steps being very careful around area on forearm where pt recently had surgery. Compression bandaging: TG soft from hand to axilla, mollelast to digits 1-4 , artiflex from hand to axilla, 1 6cm bandage at hand, 1 8 cm bandage from wrist to  axilla in herringbone, 1 12 cm bandage from hand to axilla with spiral.. Pt advised if wrap is loose on Wed. She can try her day sleeve with the medi harmony glove that the thumb  is too big during the day  and wear her Circaid at night on Wed. Before coming in here Thursday with her sleeve and harmony glove. If the harmony glove works despite the thumb being very big, she may be able to get by with it until her custom glove comes.   02/26/2022 Called Romeo Rabon. [facility]Custom glove has not been ordered. Large volume of Medicare orders has slowed things down.  Custom glove will be 305.98 and medicare will pay for. They will send return label to pt. By mail to return 3 medi harmony gloves which she received for unknown reasons. Wraps removed and lotion applied Manual lymph drainage in supine as follows: short neck, (no diaphragmatic breathing due to shortness of breath from medication) , left inguinal nodes, left axillo-inguinal anastamoses. Left upper extremity working proximal to distal then retracing all steps being very careful around area on forearm where pt recently had surgery. Compression bandaging: TG soft from hand to axilla, mollelast to digits 1-4 , artiflex from hand to axilla, 1 6cm bandage at hand, 1 8 cm bandage from wrist to axilla in herringbone, 1 12 cm bandage from hand to axilla with spiral..  02/23/2022 Tried calling Romeo Rabon at [place]. Pt has now received 3 Medi Harmony incorrect gloves and has still not received her custom glove and pt has to continue PT until she receives it. Manual lymph drainage in supine as follows: short neck, (no diaphragmatic breathing due to shortness of breath from medication) , left inguinal nodes, left axillo-inguinal anastamoses. Left upper extremity working proximal to distal then retracing all steps being very careful around area on forearm where pt recently had surgery. Compression bandaging: TG soft from hand to axilla, mollelast to digits 1-4 ,  artiflex from hand to axilla, 1 6cm bandage at hand, 1 8 cm bandage from wrist to axilla in herringbone, 1 12 cm bandage from hand to axilla with spiral..    02/19/2022 Pt re- measured and maintaining well while waiting for garment Manual lymph drainage in supine as follows: short neck, (no diaphragmatic breathing due to shortness of breath from medication) , left inguinal nodes, left axillo-inguinal anastamoses. Left upper extremity working proximal to distal then retracing all steps being very careful around area on forearm where pt recently had surgery. Compression bandaging: TG soft from hand to axilla, mollelast to digits 1-4 , artiflex from hand to axilla, 1 6cm bandage at hand, 1 8 cm bandage from wrist to axilla in herringbone, 1 12 cm bandage from hand to axilla with spiral..   02/16/2022 Wraps removed Manual lymph drainage in supine as follows: short neck, (no diaphragmatic breathing due to shortness of breath from medication) , left inguinal nodes, left axillo-inguinal anastamoses. Left upper extremity working proximal to distal then retracing all steps being very careful around area on forearm where pt recently had surgery. Compression bandaging: TG soft from hand to axilla, mollelast to digits 1-4 , artiflex from hand to axilla, 1 6cm bandage at hand, 1 8 cm bandage from wrist to axilla in herringbone, 1 12 cm bandage from hand to axilla with spiral..    02/11/2021 Had pt try putting her sleeve on the arm butler which she did with minimal difficulty Manual lymph drainage in supine as follows: short neck, (no diaphragmatic breathing due to shortness of breath from medication) , left inguinal nodes, left axillo-inguinal anastamoses. Left upper extremity working proximal to distal then retracing all steps being very careful around area on forearm where pt recently  had surgery. Compression bandaging: TG soft from hand to axilla, mollelast to digits 1-4 , artiflex from hand to axilla, 1 6cm  bandage at hand, 1 8 cm bandage from wrist to axilla in herringbone, 1 12 cm bandage from hand to axilla with spiral.  02/09/2021 All wraps removed and pt remeasured. Slight irritation again noted above elbow but no skin break down Practiced donning sleeve several different ways to see how pt did the best. Tried Juzo foot slip eze over hand with pt pulling sleeve over it. Pt did fairly well initially but got the sleeve bunched up and was not able to get it any higher without help. She did well with the rubber gloves and mild assist but was not quite getting high enough without help. Removed sleeve and had pt try the arm butler. Therapist placed sleeve over butler and pt felt she would be able to do the same.  She did much better getting her arm into the sleeve and was only lacking getting it about an inch from the top. She was able to move it up with the rubber glove. When on correctly sleeve seemed to fit well. Pt had only received 2 Medi harmony gloves and we are not sure where they came from as we ordered an espirit glove. Demetrius Charity was too big and was not flat knit so removed pt sleeve and therapist wrapped pts arm: applied cocoa butter then Compression bandaging: TG soft from hand to axilla, mollelast to digits 1-4 , artiflex from hand to axilla, 1 6cm bandage at hand, 1 8 cm bandage from wrist to axilla in herringbone, 1 12 cm bandage from hand to axilla with spiral. Manus Gunning PT also looked at pts sleeve and invoice which showed that a medi espirit glove was ordered. Pt will check to see if she can find another glove at home. Not sure how she received 2 harmony gloves        PATIENT EDUCATION:  Education details: discussed POC, bandaging, new garments, need for 3x/week, instruct family member for weekends if she would like. Person educated: Patient Education method: Explanation Education comprehension: verbalized understanding  HOME EXERCISE PROGRAM: None given  today  ASSESSMENT:  CLINICAL IMPRESSION: Pt has now had the sleeve for 2 weeks and due to illness was unable to come for previous 2 appts.  Her sleeve and glove are very stretched out and in need of laundering. She verbalized understanding and will do so tonight so she can put on in the am and it should hold her swelling better. She was up slightly at the 10 cm prox to wrist measurement and the lower forearm felt firmer to palpation. She has a good understanding how to use the arm butler, but sometimes has trouble getting the wrist into the sleeve and getting the sleeve up high enough.    OBJECTIVE IMPAIRMENTS: decreased activity tolerance, decreased knowledge of condition, decreased ROM, increased edema, impaired UE functional use, postural dysfunction, and pain.   ACTIVITY LIMITATIONS: lifting, dressing, and reach over head  PARTICIPATION LIMITATIONS: cleaning, yard work, and reaching activities  PERSONAL FACTORS: 3+ comorbidities: Breast Cancer s/p radiation and with metastasis, COPD, OA, current cancer meds  are also affecting patient's functional outcome.   REHAB POTENTIAL: Good  CLINICAL DECISION MAKING: Stable/uncomplicated  EVALUATION COMPLEXITY: Low  GOALS: Goals reviewed with patient? Yes  SHORT TERM GOALS: Target date: 04/09/2022    Pt will be tolerant of compression bandaging Baseline: Goal status: MET  12/18/2021 2.  Pt  will have reduction at olecranon and 10 cm prox to ulna by 1.5 cm Baseline: Goal status: MET 12/18/2021  LONG TERM GOALS: Target date: 04/30/2022    Pt will have decreased edema at olecranon and 10 cm prox to ulna styloid by 2.5 cm Baseline: 24.0 Goal status: MET 21.3 cm on 01/05/22  2.  Pt will be fit for appropriate compression sleeve Baseline:  Goal status: MET has proper sleeve, awaiting glove  3.  Pt will be independent with self care of lymphedema including day and night compression Baseline:  Goal status: IN PROGRESS( has day sleeve, and  night circaid, awaiting glove)  4.  Pt will improve LLIS to no greater than 30% to demonstrate improved function  Baseline:  Goal status: IN PROGRESS    PLAN:  PT FREQUENCY: 1 time every week or 2  PT DURATION:6 weeks PLANNED INTERVENTIONS: Therapeutic exercises, Patient/Family education, Self Care, Orthotic/Fit training, Manual lymph drainage, Compression bandaging, and Manual therapy  PLAN FOR NEXT SESSION: ,Follow up again with pt in 2 weeks. Pt advised to call if she needs to be seen sooner.still awaiting custom glove but is trying Medi harmony temporarily and seems to be holding hand fine.. ,  Reassess next week, if doing well put out 2 weeks. Discuss again washing garments,Awaiting new daytime glove, has already received Circaid Night  Stacey [name], PT 03/19/2022, 4:56 PM

## 2022-03-23 ENCOUNTER — Ambulatory Visit: Payer: Medicare Other

## 2022-03-29 ENCOUNTER — Other Ambulatory Visit: Payer: Self-pay | Admitting: Hematology and Oncology

## 2022-03-30 ENCOUNTER — Other Ambulatory Visit: Payer: Self-pay

## 2022-03-30 MED ORDER — TRIAMTERENE-HCTZ 37.5-25 MG PO CAPS: 1.0000 | ORAL_CAPSULE | Freq: Every morning | ORAL | 4 refills | Status: DC

## 2022-04-01 ENCOUNTER — Other Ambulatory Visit: Payer: Self-pay | Admitting: *Deleted

## 2022-04-02 ENCOUNTER — Encounter: Payer: Self-pay | Admitting: Oncology

## 2022-04-02 ENCOUNTER — Ambulatory Visit: Payer: Medicare Other

## 2022-04-02 DIAGNOSIS — C44629 Squamous cell carcinoma of skin of left upper limb, including shoulder: Secondary | ICD-10-CM

## 2022-04-02 DIAGNOSIS — C44619 Basal cell carcinoma of skin of left upper limb, including shoulder: Secondary | ICD-10-CM | POA: Diagnosis not present

## 2022-04-02 DIAGNOSIS — I890 Lymphedema, not elsewhere classified: Secondary | ICD-10-CM

## 2022-04-02 NOTE — Therapy (Addendum)
OUTPATIENT PHYSICAL THERAPY ONCOLOGY TREATMENT  Patient Name: Stacey [name] MRN: [id number] DOB:[birth year], 82 y.o., female Today's Date: 04/02/2022     PT End of Session - 04/02/22 1600     Visit Number 27    Number of Visits 31    Date for PT Re-Evaluation 04/16/22    PT Start Time 1602    PT Stop Time 1700    PT Time Calculation (min) 58 min    Activity Tolerance Patient tolerated treatment well    Behavior During Therapy [facility] for tasks assessed/performed               Past Medical History:  Diagnosis Date   ANEMIA-NOS    ANXIETY    Breast cancer ([place]) 1997 L, 2012 R   s/p chemo/xrt   COPD    resolved   DIVERTICULOSIS, COLON 2008   Dizziness    Family history of breast cancer    Family history of lung cancer    Family history of lymphoma    Family history of pancreatic cancer    Family history of uterine cancer    GERD    Hx of radiation therapy 10/19/11 -12/03/11   right breast   HYPERLIPIDEMIA    IRRITABLE BOWEL SYNDROME, HX OF    Left-sided carotid artery disease (HCC)    moderate left ICA stenosis   OSTEOARTHRITIS, HAND    PSVT (paroxysmal supraventricular tachycardia)    symptomatic on event monitor   Past Surgical History:  Procedure Laterality Date   ABDOMINAL HYSTERECTOMY     APPENDECTOMY     BREAST BIOPSY  11/14/10    r breast: inv, insitu mammary carcinoma w/calcif, er/pr +, her2 -   BREAST SURGERY     lumpectomy   CATARACT EXTRACTION     both eyes   ELECTROPHYSIOLOGIC STUDY N/A 05/10/2015   Procedure: SVT Ablation;  Surgeon: Will Meredith Leeds, MD;  Location: [facility];  Service: Cardiovascular;  Laterality: N/A;   HERNIA REPAIR     inguinal herniorrhapy  1984   left   IR US GUIDE BX ASP/DRAIN  05/26/2019   rectal fissure repair     s/p benign breast biopsy  2003   right   s/p left foot surgury  2009   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   spiral fx left foot  2008   no surgury   TMJ ARTHROPLASTY   1989   TONGUE SURGERY     1988- to remove scar tissue growth    TONSILLECTOMY     Patient Active Problem List   Diagnosis Date Noted   Acute upper respiratory infection 05/16/2021   Nasal dryness 02/04/2021   Chronic sinusitis 11/25/2020   Allergic rhinitis 07/08/2020   Hypomagnesemia 06/10/2020   Goals of care, counseling/discussion 06/21/2019   Family history of breast cancer    Family history of pancreatic cancer    Family history of uterine cancer    Family history of lymphoma    Family history of lung cancer    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[place]) 06/02/2019   Recurrent cancer of left breast ([place]) 05/15/2019   Pes anserine bursitis 03/31/2019   HTN (hypertension) 02/10/2019   Hyperglycemia 06/21/2018   Left carpal tunnel syndrome 02/22/2018   Rotator cuff arthropathy of left shoulder 09/20/2017   Arthritis of hand 08/23/2017   Pain in right hand 08/23/2017   Cervical radiculitis 04/09/2017  Left shoulder pain 04/09/2017   Dyspnea on exertion 05/14/2016   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) 03/08/2015   Dizziness 01/24/2015   Urinary frequency 01/13/2015   Dizziness and giddiness 01/09/2015   Gallstones 02/21/2013   Malignant neoplasm of upper-inner quadrant of left breast in female, estrogen receptor positive ([place]) 11/28/2012   Muscle cramping 09/12/2012   Right hip pain 09/12/2012   Lower back pain 09/12/2012   COPD GOLD I     Hx of radiation therapy    Right shoulder pain 09/14/2011   Preventative health care 07/29/2010   Skin lesion of left leg 07/29/2010   Paresthesia 07/29/2010   GERD 03/28/2010   CONSTIPATION 03/28/2010   Osteoarthrosis, 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   Anxiety state 05/03/2009   Hyperlipidemia 06/14/2007   FATIGUE 06/14/2007   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  Rationale for Evaluation and Treatment Rehabilitation  SUBJECTIVE:                                                                                                                                                                                          SUBJECTIVE STATEMENT:   I got my glove in the mail yesterday but I haven't tried it on. I have been washing the garments and wearing my sleeve and night garments daily. My friends have noticed how good my arm looks  PERTINENT HISTORY:  Hx breast cancer with 15 lymph node removal on the L and Bil lumpectomy. Lumpectomy on the L in 1997and the R in 2012. Radiation Bil.  New discovery of metastatic breast Cancer presently on Palbocidib and Anastrozole,  Fulvestrant since 06/2019.Recent PET scan on 10/09/2021 showed no evidence of recurrent or metastatic disease. Prior lymphedema treatment for Left UE in 2021  PAIN:  Are you having pain? No pain today NPRS scale: pt did not rate on scale Pain location: wrist and hand on left  Pain orientation: Left  PAIN TYPE: stabbing Pain description: intermittent  Aggravating factors: evening Relieving factors: Tribute wrap but doesn't wear the hand piece.  PRECAUTIONS: Other: Left UE lymphedema , Metastatic breast  Cancer, COPD  WEIGHT BEARING RESTRICTIONS: No  FALLS:  Has patient fallen in last 6 months? No  LIVING ENVIRONMENT: Lives with: lives alone Lives in: House/apartment Stairs: No; External: 1 steps; on right going up Has following equipment at home: None  OCCUPATION: retired  LEISURE: reading, puzzles,  walks very little  HAND DOMINANCE: right   PRIOR LEVEL OF FUNCTION: Independent  PATIENT GOALS: Try and getting swelling in left arm down. Get a new compression sleeve that fits properly   OBJECTIVE:  COGNITION: Overall cognitive status: Within functional limits for tasks assessed   PALPATION: Mild pitting edema ulnar border of Left UE  OBSERVATIONS / OTHER ASSESSMENTS: Bandaid on forearm with small amount of clear  drainage showing through. Other aspect of incision that can be viewed is healed. Sensation to light touch in the hand is intact. Dorsum of hand is slightly swollen, and upper arm swollen above where she had TG soft folded over and with bottom folded over several inches, may be making hand swell. She complains of left wrist pain worse at night and has a weak left grip  SENSATION: Light touch: Appears intact throughout left UE   POSTURE: forward head, rounded shoulders  CERVICAL AROM: All within functional limits:   UPPER EXTREMITY STRENGTH: NT  LYMPHEDEMA ASSESSMENTS:   SURGERY TYPE/DATE: Left breast Lumpectomy 1997, Right lumpectomy 2012  NUMBER OF LYMPH NODES REMOVED: 15 on left UE  CHEMOTHERAPY:Yes, presently on medications for metastasis  RADIATION:bilateral   HORMONE TREATMENT: yes  INFECTIONS: yes  LYMPHEDEMA ASSESSMENTS:   LANDMARK RIGHT  eval  10 cm proximal to olecranon process 27.5  Olecranon process 22.4  10 cm proximal to ulnar styloid process 19.0  Just proximal to ulnar styloid process 15.2  Across hand at thumb web space 17.9  At base of 2nd digit 6.3  (Blank rows = not tested)  LANDMARK LEFT  eval LEFT 12/18/2021 LEFT 12/24/2021 LEFT 12/29/21 LEFT 01/01/2022 LEFT 01/20/2022 LEFT 01/29/2022 LEFT 02/04/2022 LEFT 02/09/2022 LEFT 02/19/2022 LEFT 03/05/2022 LEFT 03/19/2022 LEFT 04/02/2022  10 cm proximal to olecranon process 28.2 29.0 28.6 28.5 28 28.3 28.0 29.0 29.0 29.2 29.3 29 29.0  Olecranon process 27.5 25.3 25.6 25.5 25.6 25.5  24.5 25.0 25 25.3 24.9 25.0 25.0  10 cm proximal to ulnar styloid process 24.0 21 21.6 21.8 21.3 21.3 20.6 21.0 20.5 20.5 21.0 22.0 21.2  Just proximal to ulnar styloid process 18.2 17.7 18.2 17.5 17.7 17.8 16.7 17.0 17.3 17.3 17.5 17.5 17.3  Across hand at thumb web space 18.5 18.0 18.3 17.5 17.7 17.6 17.7 17.8 17.7 17.6 17.3 17.6 17.9  At base of 2nd digit 6.5 6.3 6.3 6.4 6.3 6.35 6.2 6.2 6.2 6.2 6.1 6.0 6.0  (Blank rows = not tested)  Wrist   16.4     Elbow  26           axilla  29.2,     MCPS17.3    wrist 16.8  (SLEEVE MEASUREMENTS)    LLIS : 47%   TODAY'S TREATMENT:  DATE  04/02/2022  Pt arrives wearing sleeve and harmony glove. Removed harmony glove and donned new flat knit glove that matches her sleeve. Pt struggled a little to get it on but was able to get it on independently. Removed garments and measured arm with good results Performed Manual lymph drainage in supine as follows: short neck, (no diaphragmatic breathing due to shortness of breath from medication) , left inguinal nodes, left axillo-inguinal anastamoses. Left upper extremity working proximal to distal then retracing all steps. Pt donned sleeve using arm butler with minimal help of PT, and also donned new glove with slight difficulty but independently. Discussed that she may need to get used to glove a little at a time and to remove prn for several days to let her hand adjust.  03/19/2022 Pt arrives wearing sleve and glove but not pulled all the way up and folded over slightly at the top. Garments removed and pts arm observed. Mild swelling at dorsum of hand and visible swelling just above wrist to just below 10 cm mark. Pt was remeasured and most measurements fairly stable except increased at 10 cm, and slightly at hand. Discussed importance of laundering garments every couple of days so they hold  swelling better. She is still awaiting the proper glove. Performed Manual lymph drainage in supine as follows: short neck, (no diaphragmatic breathing due to shortness of breath from medication) , left inguinal nodes, left axillo-inguinal anastamoses. Left upper extremity working proximal to distal then retracing all steps being very careful around area on forearm where pt recently had surgery. Reviewed with pt  how to turn the top down over the sleeve and stretch the upper arm part over the arm [name] being careful not to stretch the wrist part over the [place], and how to slide arm in and use opposite hand to assist sleeve over wrist prn. Pt was able to get arm most of the way up and therapist assisted her with getting out wrinkles as she was very fatigued. Discussed pt trying the over sleeve with her night garment and to check her arm and hand swelling when she removes the night garment to be sure it is taking down the swelling that may arise during the day.   03/05/2022 Removed sleeve and Medi Harmony glove and remeasured pt. No swelling in the hand, and the arm looks good as well. The sleeve was about an inch too low at the top and the wrist on the sleeve is large.  Pt did indicate that she struggled with the arm butler and actually tried to stretch the wrist over it which may have stretched it out. She will wash the sleeve tonight or tomorrow to try and tighten it up some. Manual lymph drainage in supine as follows: short neck, (no diaphragmatic breathing due to shortness of breath from medication) , left inguinal nodes, left axillo-inguinal anastamoses. Left upper extremity working proximal to distal then retracing all steps being very careful around area on forearm where pt recently had surgery. Pt shown how to turn the top down over the sleeve and stretch the upper arm part over the arm [place]. She was able to do that much more easily since she was trying before to stretch the wrist over it. She was  able to get her arm most of the way into the sleeve with the arm butler, but did not quite get it over her wrist to start, and was about an inch too low.  Able to correct with rubber glove  and slight assist from PT. Pt took pictures of the sleeve on the arm butler with her phone.  03/02/2022 Manual lymph drainage in supine as follows: short neck, (no diaphragmatic breathing due to shortness of breath from medication) , left inguinal nodes, left axillo-inguinal anastamoses. Left upper extremity working proximal to distal then retracing all steps being very careful around area on forearm where pt recently had surgery. Compression bandaging: TG soft from hand to axilla, mollelast to digits 1-4 , artiflex from hand to axilla, 1 6cm bandage at hand, 1 8 cm bandage from wrist to axilla in herringbone, 1 12 cm bandage from hand to axilla with spiral.. Pt advised if wrap is loose on Wed. She can try her day sleeve with the medi harmony glove that the thumb is too big during the day  and wear her Circaid at night on Wed. Before coming in here Thursday with her sleeve and harmony glove. If the harmony glove works despite the thumb being very big, she may be able to get by with it until her custom glove comes.   02/26/2022 Called Romeo [facility]on. [facility]Custom glove has not been ordered. Large volume of Medicare orders has slowed things down.  Custom glove will be 305.98 and medicare will pay for. They will send return label to pt. By mail to return 3 medi harmony gloves which she received for unknown reasons. Wraps removed and lotion applied Manual lymph drainage in supine as follows: short neck, (no diaphragmatic breathing due to shortness of breath from medication) , left inguinal nodes, left axillo-inguinal anastamoses. Left upper extremity working proximal to distal then retracing all steps being very careful around area on forearm where pt recently had surgery. Compression bandaging: TG soft from hand to axilla,  mollelast to digits 1-4 , artiflex from hand to axilla, 1 6cm bandage at hand, 1 8 cm bandage from wrist to axilla in herringbone, 1 12 cm bandage from hand to axilla with spiral..  02/23/2022 Tried calling Romeo Rabon at [facility]. Pt has now received 3 Medi Harmony incorrect gloves and has still not received her custom glove and pt has to continue PT until she receives it. Manual lymph drainage in supine as follows: short neck, (no diaphragmatic breathing due to shortness of breath from medication) , left inguinal nodes, left axillo-inguinal anastamoses. Left upper extremity working proximal to distal then retracing all steps being very careful around area on forearm where pt recently had surgery. Compression bandaging: TG soft from hand to axilla, mollelast to digits 1-4 , artiflex from hand to axilla, 1 6cm bandage at hand, 1 8 cm bandage from wrist to axilla in herringbone, 1 12 cm bandage from hand to axilla with spiral..       PATIENT EDUCATION:  Education details: discussed POC, bandaging, new garments, need for 3x/week, instruct family member for weekends if she would like. Person educated: Patient Education method: Explanation Education comprehension: verbalized understanding  HOME EXERCISE PROGRAM: None given today  ASSESSMENT:  CLINICAL IMPRESSION: Pts arm continues to look good and she is laundering properly now. Her new flat knit glove finally arrived and seems to fit well. She has a little difficulty donning but could do with time and get on properly. She will not schedule any further appts at this time, but will call with questions or concerns. Pt has achieved all applicable goals except LLIS was forgotten today. OBJECTIVE IMPAIRMENTS: decreased activity tolerance, decreased knowledge of condition, decreased ROM, increased edema, impaired UE functional use, postural dysfunction,  and pain.   ACTIVITY LIMITATIONS: lifting, dressing, and reach over head  PARTICIPATION LIMITATIONS:  cleaning, yard work, and reaching activities  PERSONAL FACTORS: 3+ comorbidities: Breast Cancer s/p radiation and with metastasis, COPD, OA, current cancer meds  are also affecting patient's functional outcome.   REHAB POTENTIAL: Good  CLINICAL DECISION MAKING: Stable/uncomplicated  EVALUATION COMPLEXITY: Low  GOALS: Goals reviewed with patient? Yes  SHORT TERM GOALS: Target date: 04/23/2022    Pt will be tolerant of compression bandaging Baseline: Goal status: MET  12/18/2021 2.  Pt will have reduction at olecranon and 10 cm prox to ulna by 1.5 cm Baseline: Goal status: MET 12/18/2021  LONG TERM GOALS: Target date: 05/14/2022    Pt will have decreased edema at olecranon and 10 cm prox to ulna styloid by 2.5 cm Baseline: 24.0 Goal status: MET 21.3 cm on 01/05/22  2.  Pt will be fit for appropriate compression sleeve Baseline:  Goal status: MET has proper sleeve, awaiting glove MET 04/02/2022  3.  Pt will be independent with self care of lymphedema including day and night compression Baseline:  Goal status: MET 04/02/2022  4.  Pt will improve LLIS to no greater than 30% to demonstrate improved function  Baseline:  Goal status: Forgotten   PLAN:  PT FREQUENCY: 1 time every week or 2  PT DURATION:6 weeks PLANNED INTERVENTIONS: Therapeutic exercises, Patient/Family education, Self Care, Orthotic/Fit training, Manual lymph drainage, Compression bandaging, and Manual therapy  PLAN FOR NEXT SESSION: no further appts scheduled. Will DC in 3-4 weeks if no contact. Pt to call with questions or concerns PHYSICAL THERAPY DISCHARGE SUMMARY  Visits from Start of Care: 27  Current functional level related to goals / functional outcomes: Achieved all applicable goals except LLIS was forgotten today   Remaining deficits: Left UE swelling remains but is controlled with new garments   Education / Equipment: Compression sleeve, glove, night garment (Circaid)   Patient agrees to  discharge. Patient goals were met. Patient is being discharged due to meeting the stated rehab goals. And no further ocntact by pt.   Claris Pong, PT 04/02/2022, 5:36 PM

## 2022-04-07 ENCOUNTER — Other Ambulatory Visit (HOSPITAL_COMMUNITY): Payer: Self-pay

## 2022-04-10 ENCOUNTER — Inpatient Hospital Stay (HOSPITAL_BASED_OUTPATIENT_CLINIC_OR_DEPARTMENT_OTHER): Payer: Medicare Other | Admitting: Adult Health

## 2022-04-10 ENCOUNTER — Encounter: Payer: Self-pay | Admitting: Oncology

## 2022-04-10 ENCOUNTER — Other Ambulatory Visit: Payer: Self-pay

## 2022-04-10 ENCOUNTER — Encounter: Payer: Self-pay | Admitting: Adult Health

## 2022-04-10 ENCOUNTER — Inpatient Hospital Stay: Payer: Medicare Other

## 2022-04-10 ENCOUNTER — Inpatient Hospital Stay: Payer: Medicare Other | Attending: Adult Health

## 2022-04-10 VITALS — BP 137/62 | HR 81 | Temp 97.8°F | Resp 18 | Ht 63.0 in | Wt 143.8 lb

## 2022-04-10 DIAGNOSIS — Z800 Family history of malignant neoplasm of digestive organs: Secondary | ICD-10-CM | POA: Diagnosis not present

## 2022-04-10 DIAGNOSIS — M7989 Other specified soft tissue disorders: Secondary | ICD-10-CM

## 2022-04-10 DIAGNOSIS — Z803 Family history of malignant neoplasm of breast: Secondary | ICD-10-CM | POA: Diagnosis not present

## 2022-04-10 DIAGNOSIS — Z923 Personal history of irradiation: Secondary | ICD-10-CM | POA: Insufficient documentation

## 2022-04-10 DIAGNOSIS — Z79818 Long term (current) use of other agents affecting estrogen receptors and estrogen levels: Secondary | ICD-10-CM | POA: Insufficient documentation

## 2022-04-10 DIAGNOSIS — E785 Hyperlipidemia, unspecified: Secondary | ICD-10-CM | POA: Insufficient documentation

## 2022-04-10 DIAGNOSIS — Z87891 Personal history of nicotine dependence: Secondary | ICD-10-CM | POA: Diagnosis not present

## 2022-04-10 DIAGNOSIS — C50212 Malignant neoplasm of upper-inner quadrant of left female breast: Secondary | ICD-10-CM | POA: Diagnosis not present

## 2022-04-10 DIAGNOSIS — Z170 Estrogen receptor positive status [ER+]: Secondary | ICD-10-CM | POA: Diagnosis not present

## 2022-04-10 DIAGNOSIS — R197 Diarrhea, unspecified: Secondary | ICD-10-CM | POA: Diagnosis not present

## 2022-04-10 DIAGNOSIS — I10 Essential (primary) hypertension: Secondary | ICD-10-CM | POA: Diagnosis not present

## 2022-04-10 DIAGNOSIS — Z801 Family history of malignant neoplasm of trachea, bronchus and lung: Secondary | ICD-10-CM | POA: Insufficient documentation

## 2022-04-10 DIAGNOSIS — C7951 Secondary malignant neoplasm of bone: Secondary | ICD-10-CM | POA: Diagnosis not present

## 2022-04-10 DIAGNOSIS — K909 Intestinal malabsorption, unspecified: Secondary | ICD-10-CM | POA: Diagnosis not present

## 2022-04-10 DIAGNOSIS — Z86718 Personal history of other venous thrombosis and embolism: Secondary | ICD-10-CM | POA: Insufficient documentation

## 2022-04-10 DIAGNOSIS — I471 Supraventricular tachycardia, unspecified: Secondary | ICD-10-CM | POA: Diagnosis not present

## 2022-04-10 DIAGNOSIS — M8580 Other specified disorders of bone density and structure, unspecified site: Secondary | ICD-10-CM | POA: Insufficient documentation

## 2022-04-10 DIAGNOSIS — R739 Hyperglycemia, unspecified: Secondary | ICD-10-CM | POA: Insufficient documentation

## 2022-04-10 DIAGNOSIS — K580 Irritable bowel syndrome with diarrhea: Secondary | ICD-10-CM | POA: Diagnosis not present

## 2022-04-10 DIAGNOSIS — Z79810 Long term (current) use of selective estrogen receptor modulators (SERMs): Secondary | ICD-10-CM | POA: Diagnosis not present

## 2022-04-10 LAB — CBC WITH DIFFERENTIAL (CANCER CENTER ONLY)
Abs Immature Granulocytes: 0.01 10*3/uL (ref 0.00–0.07)
Basophils Absolute: 0 10*3/uL (ref 0.0–0.1)
Basophils Relative: 1 %
Eosinophils Absolute: 0 10*3/uL (ref 0.0–0.5)
Eosinophils Relative: 1 %
HCT: 27.7 % — ABNORMAL LOW (ref 36.0–46.0)
Hemoglobin: 9.5 g/dL — ABNORMAL LOW (ref 12.0–15.0)
Immature Granulocytes: 0 %
Lymphocytes Relative: 19 %
Lymphs Abs: 0.5 10*3/uL — ABNORMAL LOW (ref 0.7–4.0)
MCH: 36.7 pg — ABNORMAL HIGH (ref 26.0–34.0)
MCHC: 34.3 g/dL (ref 30.0–36.0)
MCV: 106.9 fL — ABNORMAL HIGH (ref 80.0–100.0)
Monocytes Absolute: 0.4 10*3/uL (ref 0.1–1.0)
Monocytes Relative: 15 %
Neutro Abs: 1.8 10*3/uL (ref 1.7–7.7)
Neutrophils Relative %: 64 %
Platelet Count: 122 10*3/uL — ABNORMAL LOW (ref 150–400)
RBC: 2.59 MIL/uL — ABNORMAL LOW (ref 3.87–5.11)
RDW: 14.4 % (ref 11.5–15.5)
Smear Review: NORMAL
WBC Count: 2.8 10*3/uL — ABNORMAL LOW (ref 4.0–10.5)
nRBC: 0 % (ref 0.0–0.2)

## 2022-04-10 LAB — CMP (CANCER CENTER ONLY)
ALT: 11 U/L (ref 0–44)
AST: 17 U/L (ref 15–41)
Albumin: 4.3 g/dL (ref 3.5–5.0)
Alkaline Phosphatase: 60 U/L (ref 38–126)
Anion gap: 11 (ref 5–15)
BUN: 30 mg/dL — ABNORMAL HIGH (ref 8–23)
CO2: 20 mmol/L — ABNORMAL LOW (ref 22–32)
Calcium: 9.3 mg/dL (ref 8.9–10.3)
Chloride: 108 mmol/L (ref 98–111)
Creatinine: 1.33 mg/dL — ABNORMAL HIGH (ref 0.44–1.00)
GFR, Estimated: 40 mL/min — ABNORMAL LOW (ref >60)
Glucose, Bld: 109 mg/dL — ABNORMAL HIGH (ref 70–99)
Potassium: 4.1 mmol/L (ref 3.5–5.1)
Sodium: 139 mmol/L (ref 135–145)
Total Bilirubin: 0.3 mg/dL (ref 0.3–1.2)
Total Protein: 7.8 g/dL (ref 6.5–8.1)

## 2022-04-10 LAB — MAGNESIUM: Magnesium: 1.1 mg/dL — ABNORMAL LOW (ref 1.7–2.4)

## 2022-04-10 MED ORDER — FULVESTRANT 250 MG/5ML IM SOSY
500.0000 mg | PREFILLED_SYRINGE | Freq: Once | INTRAMUSCULAR | Status: AC
  Administered 2022-04-10: 500 mg via INTRAMUSCULAR
  Filled 2022-04-10: qty 10

## 2022-04-10 MED ORDER — DIPHENOXYLATE-ATROPINE 2.5-0.025 MG PO TABS: 1.0000 | ORAL_TABLET | Freq: Three times a day (TID) | ORAL | 0 refills | Status: DC | PRN

## 2022-04-10 MED ORDER — MAGNESIUM SULFATE 2 GM/50ML IV SOLN
2.0000 g | Freq: Once | INTRAVENOUS | Status: AC
  Administered 2022-04-10: 2 g via INTRAVENOUS
  Filled 2022-04-10: qty 50

## 2022-04-10 NOTE — Progress Notes (Signed)
[facility] Cancer Follow up:    [name], MD [address] 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   #2 status post 4 cycles of doxorubicin and cyclophosphamide,                          (i) followed by radiation therapy under the care of Dr. [name].   #3 received tamoxifen for a total of seven years    RIGHT BREAST #4  S/P biopsy of the RIGHT breast upper inner quadrant on 11/14/10 showing invasive ductal carcinoma,, grade 2, estrogen receptor 85% and progesterone receptor 57% positive, Ki67 20%, HER2 not amplified.     #5 started neoadjuvant letrozole in November 2012; switched to tamoxifen as of February 2016 due to osteopenia concerns   #6  S/P right lumpectomy with sentinel lymph node biopsy on 09/03/11 for a ypT2, ypN1a, stage IIB invasive lobular carcinoma, grade 2,estrogen receptor 97% positive, progesterone receptor 12% positive, with no HER-2 amplification.   #7 status post right breast radiation therapy under the care of Dr. [name] from 10/19/2011 to 12/03/2011.    #8 did not meet criteria for genetic testing according to her insurance company.   #9 osteopenia, with a T score of -1.8 on DEXA scan at [facility] 03/07/2013             (a) status post multiple dental extractions and implants             (b) repeat bone density at [facility] 04/02/2015 shows a T score of -2.0   METASTATIC DISEASE: April 2021 (lymph nodes, bone)   #10:  left upper extremity lymphedema led to chest  CT scan 05/09/2019 showing a 5.1 cm left subpectoral chest wall mass and thoracic lymphadenopathy              (a) CT biopsy of the chest wall mass 05/25/2019 confirms recurrent breast cancer, strongly estrogen and progesterone receptor positive, HER-2 not amplified             (b) PET scan 05/30/2019 shows a left subpectoral mass measuring 5.4 cm, with significant regional and mediastinal adenopathy, sclerotic left scapular metastasis, but no liver or lung involvement.             (c) CA 27-29 is moderately informative   #11 anastrozole started 06/02/2019; palbociclib added 06/08/2019             (a) palbociclib taken irregularly for several months secondary to cytopenias             (b) palbociclib dose decreased to 100 mg daily 21 on 7 off October 2021             (c) CT of the chest with contrast 11/09/2019 shows mild disease progression (despite a continuing drop in the CA 27-29)             (d) fulvestrant added 11/23/2019             (  e) repeat chest CT with contrast 09/09/2020 read as stable.   #12 denosumab/Xgeva starting 06/08/2019, to be repeated every 3 months due to hypocalcemia             (a) treatment held after August 2022 dose secondary to ongoing dental issues   #13 genetics testing 06/15/2019 through the [facility] Common Hereditary Cancers Panel found no deleterious mutations in APC, ATM, AXIN2, BARD1, BMPR1A, BRCA1, BRCA2, BRIP1, CDH1, CDKN2A (p14ARF), CDKN2A (p16INK4a), CKD4, CHEK2, CTNNA1, DICER1, EPCAM (Deletion/duplication testing only), GREM1 (promoter region deletion/duplication testing only), KIT, MEN1, MLH1, MSH2, MSH3, MSH6, MUTYH, NBN, NF1, NHTL1, PALB2, PDGFRA, PMS2, POLD1, POLE, PTEN, RAD50, RAD51C, RAD51D, RNF43, SDHB, SDHC, SDHD, SMAD4, SMARCA4. STK11, TP53, TSC1, TSC2, and VHL.  The following genes were evaluated for sequence changes only: SDHA and HOXB13 c.251G>A variant only.  CURRENT THERAPY:Anastrozole, Palbociclib, Fulvestrant  INTERVAL HISTORY: Stacey [name]  82 y.o. female returns for follow-up of her metastatic breast cancer currently on treatment with anastrozole palbociclib and fulvestrant.  Recent restaging PET scan occurred on March 18, 2022 demonstrating no evidence of hypermetabolic metastatic disease, treated sclerotic left scapular metastasis.  Incidentally cholelithiasis aortic atherosclerosis sclerosis, coronary artery calcification and emphysema was noted.  He will notes that she continues to have diarrhea and decreased magnesium.  She tells me that she would like to try different medication if there is what available other than the cholestyramine for her diarrhea.  She also tells me that she has unilateral right leg swelling that has been ongoing for the past 2 to 3 weeks.   Patient Active Problem List   Diagnosis Date Noted   Acute upper respiratory infection 05/16/2021   Nasal dryness 02/04/2021   Chronic sinusitis 11/25/2020   Allergic rhinitis 07/08/2020   Hypomagnesemia 06/10/2020   Goals of care, counseling/discussion 06/21/2019   Family history of breast cancer    Family history of pancreatic cancer    Family history of uterine cancer    Family history of lymphoma    Family history of lung cancer    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HCC) 06/02/2019   Recurrent cancer of left breast (HCC) 05/15/2019   Pes anserine bursitis 03/31/2019   HTN (hypertension) 02/10/2019   Hyperglycemia 06/21/2018   Left carpal tunnel syndrome 02/22/2018   Rotator cuff arthropathy of left shoulder 09/20/2017   Arthritis of hand 08/23/2017   Pain in right hand 08/23/2017   Cervical radiculitis 04/09/2017   Left shoulder pain 04/09/2017   Dyspnea on exertion 05/14/2016   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   Lymphedema of left arm 01/14/2016   Left arm swelling 12/25/2015   SVT (supraventricular tachycardia)    Left-sided carotid artery disease (HCC) 03/08/2015   Dizziness 01/24/2015   Urinary frequency  01/13/2015   Dizziness and giddiness 01/09/2015   Gallstones 02/21/2013   Malignant neoplasm of upper-inner quadrant of left breast in female, estrogen receptor positive (HCC) 11/28/2012   Muscle cramping 09/12/2012   Right hip pain 09/12/2012   Lower back pain 09/12/2012   COPD GOLD I     Hx of radiation therapy    Right shoulder pain 09/14/2011   Preventative health care 07/29/2010   Skin lesion of left leg 07/29/2010   Paresthesia 07/29/2010   GERD 03/28/2010   CONSTIPATION 03/28/2010   Osteoarthrosis, 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  MEDICAL HISTORY: Past Medical History:  Diagnosis Date   ANEMIA-NOS    ANXIETY    Breast cancer (HCC) 1997 L, 2012 R   s/p chemo/xrt   COPD    resolved   DIVERTICULOSIS, COLON 2008   Dizziness    Family history of breast cancer    Family history of lung cancer    Family history of lymphoma    Family history of pancreatic cancer    Family history of uterine cancer    GERD    Hx of radiation therapy 10/19/11 -12/03/11   right breast   HYPERLIPIDEMIA    IRRITABLE BOWEL SYNDROME, HX OF    Left-sided carotid artery disease (HCC)    moderate left ICA stenosis   OSTEOARTHRITIS, HAND    PSVT (paroxysmal supraventricular tachycardia)    symptomatic on event monitor    SURGICAL HISTORY: Past Surgical History:  Procedure Laterality Date   ABDOMINAL HYSTERECTOMY     APPENDECTOMY     BREAST BIOPSY  11/14/10    r breast: inv, insitu mammary carcinoma w/calcif, er/pr +, her2 -   BREAST SURGERY     lumpectomy   CATARACT EXTRACTION     both eyes   ELECTROPHYSIOLOGIC STUDY N/A 05/10/2015   Procedure: SVT Ablation;  Surgeon: Will Jorja Loa, MD;  Location: [facility] INVASIVE CV LAB;  Service: Cardiovascular;  Laterality: N/A;   HERNIA REPAIR     inguinal herniorrhapy  1984   left   IR US GUIDE BX ASP/DRAIN  05/26/2019   rectal fissure repair     s/p benign breast biopsy  2003   right   s/p left foot surgury  2009   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   spiral fx left foot  2008   no surgury   TMJ ARTHROPLASTY  1989   TONGUE SURGERY     1988- to remove scar tissue growth    TONSILLECTOMY      SOCIAL HISTORY: Social History   Socioeconomic History   Marital status: Divorced    Spouse name: 2 Step-children   Number of children: 2   Years of education: Not on file   Highest education level: Not on file  Occupational History   Occupation: retired Training and development officer  Tobacco Use   Smoking status: Former    Packs/day: 1.00    Years: 54.00    Total pack years: 54.00    Types: Cigarettes    Quit date: 01/03/2016    Years since quitting: 6.2   Smokeless tobacco: Never  Vaping Use   Vaping Use: Never used  Substance and Sexual Activity   Alcohol use: Yes    Comment: rare/ drinks socially   Drug use: No   Sexual activity: Never  Other Topics Concern   Not on file  Social History Narrative   Patient gets no regular exercise   No biological children   2 step children   Social Determinants of Health   Financial Resource Strain: Low Risk  (12/02/2021)   Overall Financial Resource Strain (CARDIA)    Difficulty of Paying Living Expenses: Not hard at all  Food Insecurity: No Food Insecurity (12/02/2021)   Hunger Vital Sign    Worried About Running Out of Food in the Last Year: Never true    Ran Out of Food in the Last Year:  Never true  Transportation Needs: No Transportation Needs (12/02/2021)   PRAPARE - Administrator, Civil Service (Medical): No    Lack of Transportation (Non-Medical): No  Physical Activity: Sufficiently Active (12/02/2021)   Exercise Vital Sign    Days of Exercise per Week: 5  days    Minutes of Exercise per Session: 30 min  Stress: No Stress Concern Present (12/02/2021)   [employer] of Occupational Health - Occupational Stress Questionnaire    Feeling of Stress : Only a little  Social Connections: Socially Isolated (12/02/2021)   Social Connection and Isolation Panel [NHANES]    Frequency of Communication with Friends and Family: More than three times a week    Frequency of Social Gatherings with Friends and Family: More than three times a week    Attends Religious Services: Never    Database administrator or Organizations: No    Attends Banker Meetings: Never    Marital Status: Divorced  Catering manager Violence: Not At Risk (12/02/2021)   Humiliation, Afraid, Rape, and Kick questionnaire    Fear of Current or Ex-Partner: No    Emotionally Abused: No    Physically Abused: No    Sexually Abused: No    FAMILY HISTORY: Family History  Problem Relation Age of Onset   Hypertension Mother    Stroke Mother    Colon polyps Mother    Diabetes Mother    Pancreatic cancer Mother 40   Uterine cancer Mother 84   Lymphoma Brother        burkitts   Lung cancer Paternal Uncle    Lung cancer Maternal Grandmother 19       non-smoker   Lung cancer Maternal Grandfather    Breast cancer Cousin        maternal cousin, dx in her mid 68s   Brain cancer Cousin        maternal cousin's son; dx in his 56s   Testicular cancer Cousin        maternal cousin's son;    Breast cancer Cousin        paternal cousin; dx in her 66s   Breast cancer Cousin        paternal cousin's daughter; dx in 52s; neg genetic testing   Colon cancer Neg Hx    Esophageal cancer Neg Hx    Stomach cancer Neg Hx    Rectal cancer Neg Hx     Review of Systems  Constitutional:  Positive for fatigue. Negative for appetite change, chills, fever and unexpected weight change.  HENT:   Negative for hearing loss, lump/mass and trouble swallowing.   Eyes:  Negative for eye  problems and icterus.  Respiratory:  Negative for chest tightness, cough and shortness of breath.   Cardiovascular:  Negative for chest pain, leg swelling (right leg) and palpitations.  Gastrointestinal:  Positive for diarrhea (Chronic x 3 years). Negative for abdominal distention, abdominal pain, constipation, nausea and vomiting.  Endocrine: Negative for hot flashes.  Genitourinary:  Negative for difficulty urinating.   Musculoskeletal:  Negative for arthralgias.  Skin:  Negative for itching and rash.  Neurological:  Negative for dizziness, extremity weakness, headaches and numbness.  Hematological:  Negative for adenopathy. Does not bruise/bleed easily.  Psychiatric/Behavioral:  Negative for depression. The patient is not nervous/anxious.       PHYSICAL EXAMINATION  ECOG PERFORMANCE STATUS: 1 - Symptomatic but completely ambulatory  Vitals:   04/10/22 1426  BP: 137/62  Pulse: 81  Resp: 18  Temp: 97.8 F (36.6 C)  SpO2: 97%    Physical Exam Constitutional:      General: She is not in acute distress.    Appearance: Normal appearance. She is not toxic-appearing.  HENT:     Head: Normocephalic and atraumatic.  Eyes:     General: No scleral icterus. Cardiovascular:     Rate and Rhythm: Normal rate and regular rhythm.     Pulses: Normal pulses.     Heart sounds: Normal heart sounds.  Pulmonary:     Effort: Pulmonary effort is normal.     Breath sounds: Normal breath sounds.  Abdominal:     General: Abdomen is flat. Bowel sounds are normal. There is no distension.     Palpations: Abdomen is soft.     Tenderness: There is no abdominal tenderness.  Musculoskeletal:        General: Swelling (right leg) present.     Cervical back: Neck supple.  Lymphadenopathy:     Cervical: No cervical adenopathy.  Skin:    General: Skin is warm and dry.     Findings: No rash.  Neurological:     General: No focal deficit present.     Mental Status: She is alert.  Psychiatric:         Mood and Affect: Mood normal.        Behavior: Behavior normal.     LABORATORY DATA:  CBC    Component Value Date/Time   WBC 2.8 (L) 04/10/2022 1354   WBC 3.2 (L) 06/16/2021 0924   RBC 2.59 (L) 04/10/2022 1354   HGB 9.5 (L) 04/10/2022 1354   HGB 12.4 12/09/2015 1408   HCT 27.7 (L) 04/10/2022 1354   HCT 36.2 12/09/2015 1408   PLT 122 (L) 04/10/2022 1354   PLT 238 12/09/2015 1408   MCV 106.9 (H) 04/10/2022 1354   MCV 89.8 12/09/2015 1408   MCH 36.7 (H) 04/10/2022 1354   MCHC 34.3 04/10/2022 1354   RDW 14.4 04/10/2022 1354   RDW 13.2 12/09/2015 1408   LYMPHSABS 0.5 (L) 04/10/2022 1354   LYMPHSABS 2.1 12/09/2015 1408   MONOABS 0.4 04/10/2022 1354   MONOABS 0.5 12/09/2015 1408   EOSABS 0.0 04/10/2022 1354   EOSABS 0.2 12/09/2015 1408   BASOSABS 0.0 04/10/2022 1354   BASOSABS 0.0 12/09/2015 1408    CMP     Component Value Date/Time   NA 139 04/10/2022 1354   NA 140 12/09/2015 1409   K 4.1 04/10/2022 1354   K 4.2 12/09/2015 1409   CL 108 04/10/2022 1354   CL 107 02/02/2012 0939   CO2 20 (L) 04/10/2022 1354   CO2 25 12/09/2015 1409   GLUCOSE 109 (H) 04/10/2022 1354   GLUCOSE 102 12/09/2015 1409   GLUCOSE 104 (H) 02/02/2012 0939   BUN 30 (H) 04/10/2022 1354   BUN 16.7 12/09/2015 1409   CREATININE 1.33 (H) 04/10/2022 1354   CREATININE 0.8 12/09/2015 1409   CALCIUM 9.3 04/10/2022 1354   CALCIUM 9.4 12/09/2015 1409   PROT 7.8 04/10/2022 1354   PROT 7.0 12/09/2015 1409   ALBUMIN 4.3 04/10/2022 1354   ALBUMIN 3.3 (L) 12/09/2015 1409   AST 17 04/10/2022 1354   AST 20 12/09/2015 1409   ALT 11 04/10/2022 1354   ALT 13 12/09/2015 1409   ALKPHOS 60 04/10/2022 1354   ALKPHOS 54 12/09/2015 1409   BILITOT 0.3 04/10/2022 1354   BILITOT <0.22 12/09/2015 1409   GFRNONAA 40 (L) 04/10/2022 1354  GFRAA >60 10/26/2019 1347   GFRAA >60 05/09/2019 1303       ASSESSMENT and THERAPY PLAN:   Malignant neoplasm metastatic to bone [facility]) Recie is an 82 year old woman with  metastatic breast cancer currently on treatment for with fulvestrant, palbociclib, and anastrozole.  She continues on treatment with good tolerance.  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  We discussed how to use it and that she should use Imodium first and if no response then she could try Lomotil.  Due to her diarrhea she experiences hypomagnesemia.  I recommended that she received some IV magnesium of 2 g today.  We will go ahead and schedule this with every visit.  She is already taken oral magnesium twice daily.  He has unilateral right leg swelling and I placed orders for Doppler of her right leg to rule out DVT.  She will return in 4 weeks for labs, injection, and magnesium sulfate infusion.    All questions were answered. The patient knows to call the clinic with any problems, questions or concerns. We can certainly see the patient much sooner if necessary.  Total encounter time:30 minutes*in face-to-face visit time, chart review, lab review, care coordination, order entry, and documentation of the encounter time.    Lillard Anes, NP 04/13/22 9:00 AM Medical Oncology and Hematology [facility] [address] [place], [place] [address] Tel. [phone]    Fax. [phone]  *Total Encounter Time as defined by the Centers for Medicare and Medicaid Services includes, in addition to the face-to-face time of a patient visit (documented in the note above) non-face-to-face time: obtaining and reviewing outside history, ordering and reviewing medications, tests or procedures, care coordination (communications with other health care professionals or caregivers) and documentation in the medical record.

## 2022-04-10 NOTE — Patient Instructions (Signed)
Magnesium Sulfate Injection What is this medication? MAGNESIUM SULFATE (mag NEE zee um SUL fate) prevents and treats low levels of magnesium in your body. It may also be used to prevent and treat seizures during pregnancy in people with high blood pressure disorders, such as preeclampsia or eclampsia. Magnesium plays an important role in maintaining the health of your muscles and nervous system. This medicine may be used for other purposes; ask your health care provider or pharmacist if you have questions. What should I tell my care team before I take this medication? They need to know if you have any of these conditions: Heart disease History of irregular heart beat Kidney disease An unusual or allergic reaction to magnesium sulfate, medications, foods, dyes, or preservatives Pregnant or trying to get pregnant Breast-feeding How should I use this medication? This medication is for infusion into a vein. It is given in a hospital or clinic setting. Talk to your care team about the use of this medication in children. While this medication may be prescribed for selected conditions, precautions do apply. Overdosage: If you think you have taken too much of this medicine contact a poison control center or emergency room at once. NOTE: This medicine is only for you. Do not share this medicine with others. What if I miss a dose? This does not apply. What may interact with this medication? Certain medications for anxiety or sleep Certain medications for seizures, such phenobarbital Digoxin Medications that relax muscles for surgery Narcotic medications for pain This list may not describe all possible interactions. Give your health care provider a list of all the medicines, herbs, non-prescription drugs, or dietary supplements you use. Also tell them if you smoke, drink alcohol, or use illegal drugs. Some items may interact with your medicine. What should I watch for while using this  medication? Your condition will be monitored carefully while you are receiving this medication. You may need blood work done while you are receiving this medication. What side effects may I notice from receiving this medication? Side effects that you should report to your care team as soon as possible: Allergic reactions--skin rash, itching, hives, swelling of the face, lips, tongue, or throat High magnesium level--confusion, drowsiness, facial flushing, redness, sweating, muscle weakness, fast or irregular heartbeat, trouble breathing Low blood pressure--dizziness, feeling faint or lightheaded, blurry vision Side effects that usually do not require medical attention (report to your care team if they continue or are bothersome): Headache Nausea This list may not describe all possible side effects. Call your doctor for medical advice about side effects. You may report side effects to FDA at 1-800-FDA-1088. Where should I keep my medication? This medication is given in a hospital or clinic and will not be stored at home. NOTE: This sheet is a summary. It may not cover all possible information. If you have questions about this medicine, talk to your doctor, pharmacist, or health care provider.  2023 Elsevier/Gold Standard (2012-05-27 00:00:00) Fulvestrant Injection What is this medication? FULVESTRANT (ful VES trant) treats breast cancer. It works by blocking the hormone estrogen in breast tissue, which prevents breast cancer cells from spreading or growing. This medicine may be used for other purposes; ask your health care provider or pharmacist if you have questions. COMMON BRAND NAME(S): FASLODEX What should I tell my care team before I take this medication? They need to know if you have any of these conditions: Bleeding disorder Liver disease Low blood cell levels, such as low white cells, red cells, and  platelets An unusual or allergic reaction to fulvestrant, other medications, foods,  dyes, or preservatives Pregnant or trying to get pregnant Breast-feeding How should I use this medication? This medication is injected into a muscle. It is given by your care team in a hospital or clinic setting. Talk to your care team about the use of this medication in children. Special care may be needed. Overdosage: If you think you have taken too much of this medicine contact a poison control center or emergency room at once. NOTE: This medicine is only for you. Do not share this medicine with others. What if I miss a dose? Keep appointments for follow-up doses. It is important not to miss your dose. Call your care team if you are unable to keep an appointment. What may interact with this medication? Certain medications that prevent or treat blood clots, such as warfarin, enoxaparin, dalteparin, apixaban, dabigatran, rivaroxaban This list may not describe all possible interactions. Give your health care provider a list of all the medicines, herbs, non-prescription drugs, or dietary supplements you use. Also tell them if you smoke, drink alcohol, or use illegal drugs. Some items may interact with your medicine. What should I watch for while using this medication? Your condition will be monitored carefully while you are receiving this medication. You may need blood work while taking this medication. Talk to your care team if you may be pregnant. Serious birth defects can occur if you take this medication during pregnancy and for 1 year after the last dose. You will need a negative pregnancy test before starting this medication. Contraception is recommended while taking this medication and for 1 year after the last dose. Your care team can help you find the option that works for you. Do not breastfeed while taking this medication and for 1 year after the last dose. This medication may cause infertility. Talk to your care team if you are concerned about your fertility. What side effects may I  notice from receiving this medication? Side effects that you should report to your care team as soon as possible: Allergic reactions or angioedema--skin rash, itching or hives, swelling of the face, eyes, lips, tongue, arms, or legs, trouble swallowing or breathing Pain, tingling, or numbness in the hands or feet Side effects that usually do not require medical attention (report to your care team if they continue or are bothersome): Bone, joint, or muscle pain Constipation Headache Hot flashes Nausea Pain, redness, or irritation at injection site Unusual weakness or fatigue This list may not describe all possible side effects. Call your doctor for medical advice about side effects. You may report side effects to FDA at 1-800-FDA-1088. Where should I keep my medication? This medication is given in a hospital or clinic. It will not be stored at home. NOTE: This sheet is a summary. It may not cover all possible information. If you have questions about this medicine, talk to your doctor, pharmacist, or health care provider.  2023 Elsevier/Gold Standard (2007-03-12 00:00:00)

## 2022-04-11 LAB — CANCER ANTIGEN 27.29: CA 27.29: 38.1 U/mL (ref 0.0–38.6)

## 2022-04-13 ENCOUNTER — Inpatient Hospital Stay (HOSPITAL_BASED_OUTPATIENT_CLINIC_OR_DEPARTMENT_OTHER): Payer: Medicare Other | Admitting: Adult Health

## 2022-04-13 ENCOUNTER — Encounter (HOSPITAL_COMMUNITY): Payer: Medicare Other

## 2022-04-13 ENCOUNTER — Encounter: Payer: Self-pay | Admitting: Oncology

## 2022-04-13 ENCOUNTER — Other Ambulatory Visit: Payer: Self-pay

## 2022-04-13 ENCOUNTER — Ambulatory Visit (HOSPITAL_COMMUNITY)
Admission: RE | Admit: 2022-04-13 | Discharge: 2022-04-13 | Disposition: A | Discharge status: Home / Self Care | Payer: Medicare Other | Source: Ambulatory Visit | Attending: Adult Health | Admitting: Adult Health

## 2022-04-13 ENCOUNTER — Encounter: Payer: Self-pay | Admitting: Adult Health

## 2022-04-13 VITALS — BP 135/66 | HR 84 | Temp 97.7°F | Resp 18 | Ht 63.0 in | Wt 146.3 lb

## 2022-04-13 DIAGNOSIS — M7989 Other specified soft tissue disorders: Secondary | ICD-10-CM | POA: Diagnosis not present

## 2022-04-13 DIAGNOSIS — C7951 Secondary malignant neoplasm of bone: Secondary | ICD-10-CM

## 2022-04-13 DIAGNOSIS — I82411 Acute embolism and thrombosis of right femoral vein: Secondary | ICD-10-CM | POA: Diagnosis not present

## 2022-04-13 DIAGNOSIS — Z170 Estrogen receptor positive status [ER+]: Secondary | ICD-10-CM | POA: Diagnosis not present

## 2022-04-13 DIAGNOSIS — Z79818 Long term (current) use of other agents affecting estrogen receptors and estrogen levels: Secondary | ICD-10-CM | POA: Diagnosis not present

## 2022-04-13 DIAGNOSIS — C50212 Malignant neoplasm of upper-inner quadrant of left female breast: Secondary | ICD-10-CM | POA: Diagnosis not present

## 2022-04-13 DIAGNOSIS — Z79810 Long term (current) use of selective estrogen receptor modulators (SERMs): Secondary | ICD-10-CM | POA: Diagnosis not present

## 2022-04-13 MED ORDER — RIVAROXABAN 20 MG PO TABS: 20.0000 mg | ORAL_TABLET | Freq: Every day | ORAL | 11 refills | Status: DC

## 2022-04-13 MED ORDER — RIVAROXABAN (XARELTO) VTE STARTER PACK (15 & 20 MG): ORAL_TABLET | ORAL | 0 refills | Status: DC

## 2022-04-13 NOTE — Patient Instructions (Signed)
Rivaroxaban Tablets What is this medication? RIVAROXABAN (ri va ROX a ban) prevents or treats blood clots. It is also used to lower the risk of stroke in people with AFib (atrial fibrillation). It can be used to lower the risk of heart attack or stroke in people with heart or peripheral artery disease. It belongs to a group of medications called blood thinners. This medicine may be used for other purposes; ask your health care provider or pharmacist if you have questions. COMMON BRAND NAME(S): Xarelto, Xarelto Starter Pack What should I tell my care team before I take this medication? They need to know if you have any of these conditions: Antiphospholipid antibody syndrome Bleeding disorders Bleeding in the brain Blood clots Kidney disease Liver disease Prosthetic heart valve Recent or planned spinal or epidural procedure Stomach bleeding Take medications that treat or prevent blood clots An unusual or allergic reaction to rivaroxaban, other medications, foods, dyes, or preservatives Pregnant or trying to get pregnant Breast-feeding How should I use this medication? Take this medication by mouth. For your therapy to work as well as possible, take each dose exactly as prescribed on the prescription label. Do not skip doses. Skipping doses or stopping this medication can increase your risk of a blood clot or stroke. Keep taking this medication unless your care team tells you to stop. If you are taking this medication after hip or knee replacement surgery, take it with or without food. If you are taking this medication for atrial fibrillation, take it with your evening meal. If you are taking this medication to treat blood clots, take it with food at the same time each day. If you are taking this medication for coronary artery disease or peripheral artery disease, take it with or without food at the same time every day. If you are unable to swallow your tablet, you may crush the tablet and mix it  in applesauce. Then, immediately eat the applesauce. You should eat more food right after you eat the applesauce containing the crushed tablet. A special MedGuide will be given to you by the pharmacist with each prescription and refill. Be sure to read this information carefully each time. Talk to your care team about the use of this medication in children. While it may be prescribed for children as young as newborns for selected conditions, precautions do apply. Overdosage: If you think you have taken too much of this medicine contact a poison control center or emergency room at once. NOTE: This medicine is only for you. Do not share this medicine with others. What if I miss a dose? If you take your medication once a day and miss a dose, take it as soon as you can. If it is almost time for your next dose, take only that dose. Do not take double or extra doses. If you are taking this medication twice a day to treat blood clots and miss a dose, take the missed dose as soon as you remember. In this instance, 2 tablets may be taken at the same time. The next day you should take 1 tablet twice a day. If you are taking this medication twice a day for coronary artery disease or peripheral artery disease and miss a dose, skip it. Take your next dose at the normal time. Do not take extra or 2 doses at the same time to make up for the missed dose. What may interact with this medication? Do not take this medication with any of the following: Defibrotide This   medication may also interact with the following: Aspirin and aspirin-like medications Certain antibiotics like erythromycin and clarithromycin Certain medications for fungal infections like ketoconazole and itraconazole Certain medications for seizures like carbamazepine, phenytoin Certain medications that treat or prevent blood clots like warfarin, enoxaparin, dalteparin, apixaban, dabigatran, and edoxaban Conivaptan Indinavir Lopinavir;  ritonavir NSAIDS, medications for pain and inflammation, like ibuprofen or naproxen Rifampin Ritonavir SNRIs, medications for depression, like desvenlafaxine, duloxetine, levomilnacipran, venlafaxine SSRIs, medications for depression, like citalopram, escitalopram, fluoxetine, fluvoxamine, paroxetine, sertraline St. John's wort This list may not describe all possible interactions. Give your health care provider a list of all the medicines, herbs, non-prescription drugs, or dietary supplements you use. Also tell them if you smoke, drink alcohol, or use illegal drugs. Some items may interact with your medicine. What should I watch for while using this medication? Visit your care team for regular checks on your progress. You may need blood work done while you are taking this medication. Your condition will be monitored carefully while you are receiving this medication. It is important not to miss any appointments. Avoid sports and activities that might cause injury while you are using this medication. Severe falls or injuries can cause unseen bleeding. Be careful when using sharp tools or knives. Consider using an electric razor. Take special care brushing or flossing your teeth. Report any injuries, bruising, or red spots on the skin to your care team. Wear a medical ID bracelet or chain. Carry a card that describes your condition. List the medications and doses you take on the card. Tell your dentist and dental surgeon that you are taking this medication. You should not have major dental surgery while on this medication. See your dentist to have a dental exam and fix any dental problems before starting this medication. Take good care of your teeth while on this medication. Make sure you see your dentist for regular follow-up appointments. If you are going to need surgery or other procedure, tell your care team that you are using this medication. Do not become pregnant while taking this medication. Women  should inform their care team if they wish to become pregnant or think they might be pregnant. There is potential for serious harm to an unborn child. Talk to your care team for more information. What side effects may I notice from receiving this medication? Side effects that you should report to your care team as soon as possible: Allergic reactions--skin rash, itching, hives, swelling of the face, lips, tongue, or throat Bleeding--bloody or black, tar-like stools, vomiting blood or brown material that looks like coffee grounds, red or dark brown urine, small red or purple spots on skin, unusual bruising or bleeding Bleeding in the brain--severe headache, stiff neck, confusion, dizziness, change in vision, numbness or weakness of the face, arm, or leg, trouble speaking, trouble walking, vomiting Heavy periods This list may not describe all possible side effects. Call your doctor for medical advice about side effects. You may report side effects to FDA at 1-800-FDA-1088. Where should I keep my medication? Keep out of the reach of children and pets. Store at room temperature between 20 and 25 degrees C (68 and 77 degrees F). Get rid of any unused medication after the expiration date. To get rid of medications that are no longer needed or have expired: Take the medication to a medication take-back program. Check your pharmacy or law enforcement to find a location. If you cannot return the medication, check the label or package insert to see   if the medication should be thrown out in the garbage or flushed down the toilet. If you are not sure, ask your care team. If it is safe to put it in the trash, empty the medication out of the container. Mix the medication with cat litter, dirt, coffee grounds, or other unwanted substance. Seal the mixture in a bag or container. Put it in the trash. NOTE: This sheet is a summary. It may not cover all possible information. If you have questions about this medicine,  talk to your doctor, pharmacist, or health care provider.  2023 [employer]vier/Gold Standard (2020-01-16 00:00:00)  

## 2022-04-13 NOTE — Progress Notes (Signed)
RLE venous duplex has been completed.  Preliminary results given to Shelda Pal at [facility] office.   Results can be found under chart review under CV PROC. 04/13/2022 1:49 PM Nasif Bos RVT, RDMS

## 2022-04-13 NOTE — Progress Notes (Signed)
Received call from vascular lab regarding [place] results. Positive for femoral and popliteal DVT. Unable to assess calf d/t pain. Results given to Wilber Bihari, NP with subsequent appt scheduled.

## 2022-04-13 NOTE — Assessment & Plan Note (Signed)
Stacey [name] is an 82 year old woman with metastatic breast cancer currently on treatment for with fulvestrant, palbociclib, and anastrozole.  She continues on treatment with good tolerance.  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  We discussed how to use it and that she should use Imodium first and if no response then she could try Lomotil.  Due to her diarrhea she experiences hypomagnesemia.  I recommended that she received some IV magnesium of 2 g today.  We will go ahead and schedule this with every visit.  She is already taken oral magnesium twice daily.  He has unilateral right leg swelling and I placed orders for Doppler of her right leg to rule out DVT.  She will return in 4 weeks for labs, injection, and magnesium sulfate infusion.

## 2022-04-13 NOTE — Progress Notes (Signed)
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   #2 status post 4 cycles of doxorubicin and cyclophosphamide,                          (i) followed by radiation therapy under the care of Dr. [name].   #3 received tamoxifen for a total of seven years    RIGHT BREAST #4  S/P biopsy of the RIGHT breast upper inner quadrant on 11/14/10 showing invasive ductal carcinoma,, grade 2, estrogen receptor 85% and progesterone receptor 57% positive, Ki67 20%, HER2 not amplified.     #5 started neoadjuvant letrozole in November 2012; switched to tamoxifen as of February 2016 due to osteopenia concerns   #6  S/P right lumpectomy with sentinel lymph node biopsy on 09/03/11 for a ypT2, ypN1a, stage IIB invasive lobular carcinoma, grade 2,estrogen receptor 97% positive, progesterone receptor 12% positive, with no HER-2 amplification.   #7 status post right breast radiation therapy under the care of Dr. [name] from 10/19/2011 to 12/03/2011.    #8 did not meet criteria for genetic testing according to her insurance company.   #9 osteopenia, with a T score of -1.8 on DEXA scan at [facility] 03/07/2013             (a) status post multiple dental extractions and implants             (b) repeat bone density at [facility] 04/02/2015 shows a T score of -2.0   METASTATIC DISEASE: April 2021 (lymph nodes, bone)   #10:  left upper extremity lymphedema led to chest  CT scan 05/09/2019 showing a 5.1 cm left subpectoral chest wall mass and thoracic lymphadenopathy              (a) CT biopsy of the chest wall mass 05/25/2019 confirms recurrent breast cancer, strongly estrogen and progesterone receptor positive, HER-2 not amplified             (b) PET scan 05/30/2019 shows a left subpectoral mass measuring 5.4 cm, with significant regional and mediastinal adenopathy, sclerotic left scapular metastasis, but no liver or lung involvement.             (c) CA 27-29 is moderately informative   #11 anastrozole started 06/02/2019; palbociclib added 06/08/2019             (a) palbociclib taken irregularly for several months secondary to cytopenias             (b) palbociclib dose decreased to 100 mg daily 21 on 7 off October 2021             (c) CT of the chest with contrast 11/09/2019 shows mild disease progression (despite a continuing drop in the CA 27-29)             (d) fulvestrant added 11/23/2019             (  e) repeat chest CT with contrast 09/09/2020 read as stable.   #12 denosumab/Xgeva starting 06/08/2019, to be repeated every 3 months due to hypocalcemia             (a) treatment held after August 2022 dose secondary to ongoing dental issues   #13 genetics testing 06/15/2019 through the [employer] Common Hereditary Cancers Panel found no deleterious mutations in APC, ATM, AXIN2, BARD1, BMPR1A, BRCA1, BRCA2, BRIP1, CDH1, CDKN2A (p14ARF), CDKN2A (p16INK4a), CKD4, CHEK2, CTNNA1, DICER1, EPCAM (Deletion/duplication testing only), GREM1 (promoter region deletion/duplication testing only), KIT, MEN1, MLH1, MSH2, MSH3, MSH6, MUTYH, NBN, NF1, NHTL1, PALB2, PDGFRA, PMS2, POLD1, POLE, PTEN, RAD50, RAD51C, RAD51D, RNF43, SDHB, SDHC, SDHD, SMAD4, SMARCA4. STK11, TP53, TSC1, TSC2, and VHL.  The following genes were evaluated for sequence changes only: SDHA and HOXB13 c.251G>A variant only.  CURRENT THERAPY: Anastrozole, palbociclib, fulvestrant  INTERVAL HISTORY: Stacey [name] 82 y.o. female returns for follow-up and evaluation of her right leg swelling.  She underwent Doppler today that was positive for DVT.  She continues to endorse right leg pain and swelling.  He tells me that the IV magnesium she received a couple days ago she tolerated quite well.   Patient Active Problem List   Diagnosis Date Noted   Acute deep vein thrombosis (DVT) of femoral vein of right lower extremity (HCC) 04/13/2022   Acute upper respiratory infection 05/16/2021   Nasal dryness 02/04/2021   Chronic sinusitis 11/25/2020   Allergic rhinitis 07/08/2020   Hypomagnesemia 06/10/2020   Goals of care, counseling/discussion 06/21/2019   Family history of breast cancer    Family history of pancreatic cancer    Family history of uterine cancer    Family history of lymphoma    Family history of lung cancer    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[place]) 06/02/2019   Recurrent cancer of left breast ([place]) 05/15/2019   Pes anserine bursitis 03/31/2019   HTN (hypertension) 02/10/2019   Hyperglycemia 06/21/2018   Left carpal tunnel syndrome 02/22/2018   Rotator cuff arthropathy of left shoulder 09/20/2017   Arthritis of hand 08/23/2017   Pain in right hand 08/23/2017   Cervical radiculitis 04/09/2017   Left shoulder pain 04/09/2017   Dyspnea on exertion 05/14/2016   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   Lymphedema of left arm 01/14/2016   Left arm swelling 12/25/2015   SVT (supraventricular tachycardia)    Left-sided carotid artery disease ([place]) 03/08/2015   Dizziness 01/24/2015   Urinary frequency 01/13/2015   Dizziness and giddiness 01/09/2015   Gallstones 02/21/2013   Malignant neoplasm of upper-inner quadrant of left breast in female, estrogen receptor positive ([place]) 11/28/2012   Muscle cramping 09/12/2012   Right hip pain 09/12/2012   Lower back pain 09/12/2012   COPD GOLD I     Hx of radiation therapy    Right shoulder pain 09/14/2011    Preventative health care 07/29/2010   Skin lesion of left leg 07/29/2010   Paresthesia 07/29/2010   GERD 03/28/2010   CONSTIPATION 03/28/2010   Osteoarthrosis, 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  MEDICAL HISTORY: Past Medical History:  Diagnosis Date   ANEMIA-NOS    ANXIETY    Breast cancer ([place])  1997 L, 2012 R   s/p chemo/xrt   COPD    resolved   DIVERTICULOSIS, COLON 2008   Dizziness    Family history of breast cancer    Family history of lung cancer    Family history of lymphoma    Family history of pancreatic cancer    Family history of uterine cancer    GERD    Hx of radiation therapy 10/19/11 -12/03/11   right breast   HYPERLIPIDEMIA    IRRITABLE BOWEL SYNDROME, HX OF    Left-sided carotid artery disease (HCC)    moderate left ICA stenosis   OSTEOARTHRITIS, HAND    PSVT (paroxysmal supraventricular tachycardia)    symptomatic on event monitor    SURGICAL HISTORY: Past Surgical History:  Procedure Laterality Date   ABDOMINAL HYSTERECTOMY     APPENDECTOMY     BREAST BIOPSY  11/14/10    r breast: inv, insitu mammary carcinoma w/calcif, er/pr +, her2 -   BREAST SURGERY     lumpectomy   CATARACT EXTRACTION     both eyes   ELECTROPHYSIOLOGIC STUDY N/A 05/10/2015   Procedure: SVT Ablation;  Surgeon: Will Meredith Leeds, MD;  Location: [facility];  Service: Cardiovascular;  Laterality: N/A;   HERNIA REPAIR     inguinal herniorrhapy  1984   left   IR US GUIDE BX ASP/DRAIN  05/26/2019   rectal fissure repair     s/p benign breast biopsy  2003   right   s/p left foot surgury  2009   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   spiral fx left foot  2008    no surgury   TMJ ARTHROPLASTY  1989   TONGUE SURGERY     1988- to remove scar tissue growth    TONSILLECTOMY      SOCIAL HISTORY: Social History   Socioeconomic History   Marital status: Divorced    Spouse name: 2 Step-children   Number of children: 2   Years of education: Not on file   Highest education level: Not on file  Occupational History   Occupation: retired Banker  Tobacco Use   Smoking status: Former    Packs/day: 1.00    Years: 54.00    Total pack years: 54.00    Types: Cigarettes    Quit date: 01/03/2016    Years since quitting: 6.2   Smokeless tobacco: Never  Vaping Use   Vaping Use: Never used  Substance and Sexual Activity   Alcohol use: Yes    Comment: rare/ drinks socially   Drug use: No   Sexual activity: Never  Other Topics Concern   Not on file  Social History Narrative   Patient gets no regular exercise   No biological children   2 step children   Social Determinants of Health   Financial Resource Strain: Low Risk  (12/02/2021)   Overall Financial Resource Strain (CARDIA)    Difficulty of Paying Living Expenses: Not hard at all  Food Insecurity: No Food Insecurity (12/02/2021)   Hunger Vital Sign    Worried About Running Out of Food in the Last Year: Never true    Ran Out of Food in the Last Year: Never true  Transportation Needs: No Transportation Needs (12/02/2021)   PRAPARE - Hydrologist (Medical): No    Lack of Transportation (Non-Medical): No  Physical Activity: Sufficiently Active (12/02/2021)   Exercise Vital Sign    Days of  Exercise per Week: 5 days    Minutes of Exercise per Session: 30 min  Stress: No Stress Concern Present (12/02/2021)   [place]    Feeling of Stress : Only a little  Social Connections: Socially Isolated (12/02/2021)   Social Connection and Isolation Panel [NHANES]    Frequency of Communication with Friends  and Family: More than three times a week    Frequency of Social Gatherings with Friends and Family: More than three times a week    Attends Religious Services: Never    Marine scientist or Organizations: No    Attends Archivist Meetings: Never    Marital Status: Divorced  Human resources officer Violence: Not At Risk (12/02/2021)   Humiliation, Afraid, Rape, and Kick questionnaire    Fear of Current or Ex-Partner: No    Emotionally Abused: No    Physically Abused: No    Sexually Abused: No    FAMILY HISTORY: Family History  Problem Relation Age of Onset   Hypertension Mother    Stroke Mother    Colon polyps Mother    Diabetes Mother    Pancreatic cancer Mother 31   Uterine cancer Mother 11   Lymphoma Brother        burkitts   Lung cancer Paternal Uncle    Lung cancer Maternal Grandmother 44       non-smoker   Lung cancer Maternal Grandfather    Breast cancer Cousin        maternal cousin, dx in her mid 49s   Brain cancer Cousin        maternal cousin's son; dx in his 57s   Testicular cancer Cousin        maternal cousin's son;    Breast cancer Cousin        paternal cousin; dx in her 85s   Breast cancer Cousin        paternal cousin's daughter; dx in 54s; neg genetic testing   Colon cancer Neg Hx    Esophageal cancer Neg Hx    Stomach cancer Neg Hx    Rectal cancer Neg Hx     Review of Systems  Constitutional:  Negative for appetite change, chills, fatigue, fever and unexpected weight change.  HENT:   Negative for hearing loss, lump/mass and trouble swallowing.   Eyes:  Negative for eye problems and icterus.  Respiratory:  Negative for chest tightness, cough and shortness of breath.   Cardiovascular:  Positive for leg swelling. Negative for chest pain and palpitations.  Gastrointestinal:  Negative for abdominal distention, abdominal pain, constipation, diarrhea, nausea and vomiting.  Endocrine: Negative for hot flashes.  Genitourinary:  Negative for  difficulty urinating.   Musculoskeletal:  Negative for arthralgias.  Skin:  Negative for itching and rash.  Neurological:  Negative for dizziness, extremity weakness, headaches and numbness.  Hematological:  Negative for adenopathy. Does not bruise/bleed easily.  Psychiatric/Behavioral:  Negative for depression. The patient is not nervous/anxious.       PHYSICAL EXAMINATION  ECOG PERFORMANCE STATUS: 1 - Symptomatic but completely ambulatory  Vitals:   04/13/22 1349  BP: 135/66  Pulse: 84  Resp: 18  Temp: 97.7 F (36.5 C)  SpO2: 98%    Physical Exam Constitutional:      General: She is not in acute distress.    Appearance: Normal appearance. She is not toxic-appearing.  HENT:     Head: Normocephalic and atraumatic.  Eyes:  General: No scleral icterus. Cardiovascular:     Rate and Rhythm: Normal rate and regular rhythm.     Pulses: Normal pulses.     Heart sounds: Normal heart sounds.  Pulmonary:     Effort: Pulmonary effort is normal.     Breath sounds: Normal breath sounds.  Abdominal:     General: Abdomen is flat. Bowel sounds are normal. There is no distension.     Palpations: Abdomen is soft.     Tenderness: There is no abdominal tenderness.  Musculoskeletal:        General: Swelling (Right leg swelling) present.     Cervical back: Neck supple.  Lymphadenopathy:     Cervical: No cervical adenopathy.  Skin:    General: Skin is warm and dry.     Findings: No rash.  Neurological:     General: No focal deficit present.     Mental Status: She is alert.  Psychiatric:        Mood and Affect: Mood normal.        Behavior: Behavior normal.     LABORATORY DATA:  CBC    Component Value Date/Time   WBC 2.8 (L) 04/10/2022 1354   WBC 3.2 (L) 06/16/2021 0924   RBC 2.59 (L) 04/10/2022 1354   HGB 9.5 (L) 04/10/2022 1354   HGB 12.4 12/09/2015 1408   HCT 27.7 (L) 04/10/2022 1354   HCT 36.2 12/09/2015 1408   PLT 122 (L) 04/10/2022 1354   PLT 238 12/09/2015  1408   MCV 106.9 (H) 04/10/2022 1354   MCV 89.8 12/09/2015 1408   MCH 36.7 (H) 04/10/2022 1354   MCHC 34.3 04/10/2022 1354   RDW 14.4 04/10/2022 1354   RDW 13.2 12/09/2015 1408   LYMPHSABS 0.5 (L) 04/10/2022 1354   LYMPHSABS 2.1 12/09/2015 1408   MONOABS 0.4 04/10/2022 1354   MONOABS 0.5 12/09/2015 1408   EOSABS 0.0 04/10/2022 1354   EOSABS 0.2 12/09/2015 1408   BASOSABS 0.0 04/10/2022 1354   BASOSABS 0.0 12/09/2015 1408    CMP     Component Value Date/Time   NA 139 04/10/2022 1354   NA 140 12/09/2015 1409   K 4.1 04/10/2022 1354   K 4.2 12/09/2015 1409   CL 108 04/10/2022 1354   CL 107 02/02/2012 0939   CO2 20 (L) 04/10/2022 1354   CO2 25 12/09/2015 1409   GLUCOSE 109 (H) 04/10/2022 1354   GLUCOSE 102 12/09/2015 1409   GLUCOSE 104 (H) 02/02/2012 0939   BUN 30 (H) 04/10/2022 1354   BUN 16.7 12/09/2015 1409   CREATININE 1.33 (H) 04/10/2022 1354   CREATININE 0.8 12/09/2015 1409   CALCIUM 9.3 04/10/2022 1354   CALCIUM 9.4 12/09/2015 1409   PROT 7.8 04/10/2022 1354   PROT 7.0 12/09/2015 1409   ALBUMIN 4.3 04/10/2022 1354   ALBUMIN 3.3 (L) 12/09/2015 1409   AST 17 04/10/2022 1354   AST 20 12/09/2015 1409   ALT 11 04/10/2022 1354   ALT 13 12/09/2015 1409   ALKPHOS 60 04/10/2022 1354   ALKPHOS 54 12/09/2015 1409   BILITOT 0.3 04/10/2022 1354   BILITOT <0.22 12/09/2015 1409   GFRNONAA 40 (L) 04/10/2022 1354   GFRAA >60 10/26/2019 1347   GFRAA >60 05/09/2019 1303   RADIOGRAPHIC STUDIES:  VAS [place] LOWER EXTREMITY VENOUS (DVT)  Result Date: 04/13/2022  Lower Venous DVT Study Patient Name:  Stacey [name]  Date of Exam:   04/13/2022 Medical Rec #: [id number]     Accession #:  [id number] Date of Birth: August 15, 1940     Patient Gender: F Patient Age:   82 years Exam Location:  [facility] Procedure:      VAS [place] LOWER EXTREMITY VENOUS (DVT) Referring Phys: Wilborn Membreno --------------------------------------------------------------------------------  Indications: Pain.   Limitations: Pain intolerance. Comparison Study: No previous exams Performing Technologist: Jody Hill RVT, RDMS  Examination Guidelines: A complete evaluation includes B-mode imaging, spectral Doppler, color Doppler, and power Doppler as needed of all accessible portions of each vessel. Bilateral testing is considered an integral part of a complete examination. Limited examinations for reoccurring indications may be performed as noted. The reflux portion of the exam is performed with the patient in reverse Trendelenburg.  +---------+---------------+---------+-----------+----------+-----------------+ RIGHT    CompressibilityPhasicitySpontaneityPropertiesThrombus Aging    +---------+---------------+---------+-----------+----------+-----------------+ CFV      Full           Yes      Yes                                    +---------+---------------+---------+-----------+----------+-----------------+ SFJ      Full                                                           +---------+---------------+---------+-----------+----------+-----------------+ FV Prox  Partial        Yes      Yes                  Chronic           +---------+---------------+---------+-----------+----------+-----------------+ FV Mid   None           No       No                   Age Indeterminate +---------+---------------+---------+-----------+----------+-----------------+ FV DistalNone           No       No                   Age Indeterminate +---------+---------------+---------+-----------+----------+-----------------+ PFV      Full                                                           +---------+---------------+---------+-----------+----------+-----------------+ POP      None           No       No                   Age Indeterminate +---------+---------------+---------+-----------+----------+-----------------+ PTV                                                   Not visualized     +---------+---------------+---------+-----------+----------+-----------------+ PERO  Not visualized    +---------+---------------+---------+-----------+----------+-----------------+   Right Technical Findings: Not visualized segments include peroneal and posterior tibial veins due to pain intolerance.  +----+---------------+---------+-----------+----------+--------------+ LEFTCompressibilityPhasicitySpontaneityPropertiesThrombus Aging +----+---------------+---------+-----------+----------+--------------+ CFV Full           Yes      Yes                                 +----+---------------+---------+-----------+----------+--------------+     Summary: RIGHT: - Findings consistent with age indeterminate deep vein thrombosis involving the right femoral vein, and right popliteal vein. - Findings consistent with chronic deep vein thrombosis involving the right femoral vein. - A cystic structure is found in the popliteal fossa (4.6 x 1.5 x 2.7 cm).   *See table(s) above for measurements and observations.    Preliminary        ASSESSMENT and THERAPY PLAN:   Acute deep vein thrombosis (DVT) of femoral vein of right lower extremity (HCC) Blandina is an 82 year old woman with metastatic breast cancer currently on treatment with fulvestrant, anastrozole, and Ibrance here today for follow-up and management of a newly discovered right femoral vein DVT.  She underwent a Doppler earlier today that demonstrated right leg DVT.  I reviewed her case with Dr. Chryl Heck who agreed we should put her on Xarelto which will initially be dosed at 15 mg twice daily for 3 weeks followed by 20 mg daily.  Gaile and I reviewed risks and benefits of this Xarelto.  I did a medication interaction check through clinical Key and also reviewed dosing guidelines which clear her for proceeding with therapy with her creatinine clearance.  She had upcoming extractions scheduled for 2  days from now.  I recommended that she put these off until May to go ahead and get the Xarelto in her system and also to reduce her risk for pulmonary embolus.  I gave her detailed information in her after visit summary about the Xarelto.  We will see go back on April 5 for labs, follow-up with Dr. Chryl Heck, her next fulvestrant, and magnesium if needed.   All questions were answered. The patient knows to call the clinic with any problems, questions or concerns. We can certainly see the patient much sooner if necessary.  Total encounter time:30 minutes*in face-to-face visit time, chart review, lab review, care coordination, order entry, and documentation of the encounter time.    Wilber Bihari, NP 04/13/22 2:08 PM Medical Oncology and Hematology [facility] [place], [place] [address] Tel. [phone]    Fax. [phone]  *Total Encounter Time as defined by the Centers for Medicare and Medicaid Services includes, in addition to the face-to-face time of a patient visit (documented in the note above) non-face-to-face time: obtaining and reviewing outside history, ordering and reviewing medications, tests or procedures, care coordination (communications with other health care professionals or caregivers) and documentation in the medical record.

## 2022-04-13 NOTE — Assessment & Plan Note (Signed)
Stacey [name] is an 82 year old woman with metastatic breast cancer currently on treatment with fulvestrant, anastrozole, and Ibrance here today for follow-up and management of a newly discovered right femoral vein DVT.  She underwent a Doppler earlier today that demonstrated right leg DVT.  I reviewed her case with Dr. Chryl Heck who agreed we should put her on Xarelto which will initially be dosed at 15 mg twice daily for 3 weeks followed by 20 mg daily.  Stacey [name] and I reviewed risks and benefits of this Xarelto.  I did a medication interaction check through clinical Key and also reviewed dosing guidelines which clear her for proceeding with therapy with her creatinine clearance.  She had upcoming extractions scheduled for 2 days from now.  I recommended that she put these off until May to go ahead and get the Xarelto in her system and also to reduce her risk for pulmonary embolus.  I gave her detailed information in her after visit summary about the Xarelto.  We will see go back on April 5 for labs, follow-up with Dr. Chryl Heck, her next fulvestrant, and magnesium if needed.

## 2022-04-14 ENCOUNTER — Telehealth: Payer: Self-pay | Admitting: Adult Health

## 2022-04-14 NOTE — Telephone Encounter (Signed)
Scheduled appointments per 3/8 los. Patient is aware of the made appointments.

## 2022-04-23 ENCOUNTER — Telehealth: Payer: Self-pay | Admitting: *Deleted

## 2022-04-23 NOTE — Telephone Encounter (Signed)
This  RN received call from the [facility] office stating pt informed them she could not proceed with extractions presently due to being on Xeralto.  This had faxed release stating MD does not want to stop the blood thinner due to recent start for DVT and extractions while on the blood thinner is the discretion of the oral surgeon.  This RN discussed above with oral surgeon office who verifies they have multiple pts on blood thinners who obtain extractions.  This RN called pt and discussed with her above information with pt verbalizing understanding.

## 2022-04-28 ENCOUNTER — Other Ambulatory Visit (HOSPITAL_COMMUNITY): Payer: Self-pay

## 2022-04-30 ENCOUNTER — Other Ambulatory Visit (HOSPITAL_COMMUNITY): Payer: Self-pay

## 2022-05-01 ENCOUNTER — Other Ambulatory Visit (HOSPITAL_COMMUNITY): Payer: Self-pay

## 2022-05-06 ENCOUNTER — Other Ambulatory Visit (HOSPITAL_COMMUNITY): Payer: Self-pay

## 2022-05-06 ENCOUNTER — Other Ambulatory Visit: Payer: Self-pay

## 2022-05-06 ENCOUNTER — Telehealth: Payer: Self-pay | Admitting: Pharmacy Technician

## 2022-05-06 NOTE — Telephone Encounter (Signed)
Oral Oncology Patient Advocate Encounter  Received call from patient regarding an EOB statement she was sent by her medicare plan.   Confirmed with patient that the numbers listed were accurate and that the cost she owed was $0. Also confirmed that according to a test claim at [facility] she is in the "catastrophic coverage" phase of her medicare and now owes $0 for her medication on future fills without the assistance of her grants.  Lady Deutscher, CPhT-Adv Oncology Pharmacy Patient [place] Direct Number: [phone]  Fax: [phone]

## 2022-05-07 ENCOUNTER — Other Ambulatory Visit: Payer: Self-pay

## 2022-05-07 DIAGNOSIS — C7951 Secondary malignant neoplasm of bone: Secondary | ICD-10-CM

## 2022-05-07 DIAGNOSIS — Z170 Estrogen receptor positive status [ER+]: Secondary | ICD-10-CM

## 2022-05-07 DIAGNOSIS — C50912 Malignant neoplasm of unspecified site of left female breast: Secondary | ICD-10-CM

## 2022-05-08 ENCOUNTER — Inpatient Hospital Stay: Payer: Medicare Other

## 2022-05-08 ENCOUNTER — Inpatient Hospital Stay (HOSPITAL_BASED_OUTPATIENT_CLINIC_OR_DEPARTMENT_OTHER): Payer: Medicare Other | Admitting: Hematology and Oncology

## 2022-05-08 ENCOUNTER — Inpatient Hospital Stay: Payer: Medicare Other | Attending: Adult Health

## 2022-05-08 DIAGNOSIS — Z800 Family history of malignant neoplasm of digestive organs: Secondary | ICD-10-CM | POA: Diagnosis not present

## 2022-05-08 DIAGNOSIS — Z79818 Long term (current) use of other agents affecting estrogen receptors and estrogen levels: Secondary | ICD-10-CM | POA: Insufficient documentation

## 2022-05-08 DIAGNOSIS — Z87891 Personal history of nicotine dependence: Secondary | ICD-10-CM | POA: Insufficient documentation

## 2022-05-08 DIAGNOSIS — M8580 Other specified disorders of bone density and structure, unspecified site: Secondary | ICD-10-CM | POA: Insufficient documentation

## 2022-05-08 DIAGNOSIS — Z86718 Personal history of other venous thrombosis and embolism: Secondary | ICD-10-CM | POA: Diagnosis not present

## 2022-05-08 DIAGNOSIS — C50212 Malignant neoplasm of upper-inner quadrant of left female breast: Secondary | ICD-10-CM | POA: Diagnosis not present

## 2022-05-08 DIAGNOSIS — Z803 Family history of malignant neoplasm of breast: Secondary | ICD-10-CM | POA: Diagnosis not present

## 2022-05-08 DIAGNOSIS — C7951 Secondary malignant neoplasm of bone: Secondary | ICD-10-CM

## 2022-05-08 DIAGNOSIS — I10 Essential (primary) hypertension: Secondary | ICD-10-CM | POA: Insufficient documentation

## 2022-05-08 DIAGNOSIS — Z170 Estrogen receptor positive status [ER+]: Secondary | ICD-10-CM | POA: Diagnosis not present

## 2022-05-08 DIAGNOSIS — C50912 Malignant neoplasm of unspecified site of left female breast: Secondary | ICD-10-CM

## 2022-05-08 DIAGNOSIS — R0602 Shortness of breath: Secondary | ICD-10-CM | POA: Insufficient documentation

## 2022-05-08 LAB — CBC WITH DIFFERENTIAL (CANCER CENTER ONLY)
Abs Immature Granulocytes: 0.01 10*3/uL (ref 0.00–0.07)
Basophils Absolute: 0 10*3/uL (ref 0.0–0.1)
Basophils Relative: 1 %
Eosinophils Absolute: 0 10*3/uL (ref 0.0–0.5)
Eosinophils Relative: 1 %
HCT: 25.6 % — ABNORMAL LOW (ref 36.0–46.0)
Hemoglobin: 8.7 g/dL — ABNORMAL LOW (ref 12.0–15.0)
Immature Granulocytes: 0 %
Lymphocytes Relative: 21 %
Lymphs Abs: 0.5 10*3/uL — ABNORMAL LOW (ref 0.7–4.0)
MCH: 35.8 pg — ABNORMAL HIGH (ref 26.0–34.0)
MCHC: 34 g/dL (ref 30.0–36.0)
MCV: 105.3 fL — ABNORMAL HIGH (ref 80.0–100.0)
Monocytes Absolute: 0.3 10*3/uL (ref 0.1–1.0)
Monocytes Relative: 13 %
Neutro Abs: 1.5 10*3/uL — ABNORMAL LOW (ref 1.7–7.7)
Neutrophils Relative %: 64 %
Platelet Count: 133 10*3/uL — ABNORMAL LOW (ref 150–400)
RBC: 2.43 MIL/uL — ABNORMAL LOW (ref 3.87–5.11)
RDW: 14.7 % (ref 11.5–15.5)
Smear Review: NORMAL
WBC Count: 2.4 10*3/uL — ABNORMAL LOW (ref 4.0–10.5)
nRBC: 0 % (ref 0.0–0.2)

## 2022-05-08 LAB — MAGNESIUM: Magnesium: 1.1 mg/dL — ABNORMAL LOW (ref 1.7–2.4)

## 2022-05-08 LAB — CMP (CANCER CENTER ONLY)
ALT: 10 U/L (ref 0–44)
AST: 17 U/L (ref 15–41)
Albumin: 4.2 g/dL (ref 3.5–5.0)
Alkaline Phosphatase: 50 U/L (ref 38–126)
Anion gap: 11 (ref 5–15)
BUN: 38 mg/dL — ABNORMAL HIGH (ref 8–23)
CO2: 19 mmol/L — ABNORMAL LOW (ref 22–32)
Calcium: 9.3 mg/dL (ref 8.9–10.3)
Chloride: 109 mmol/L (ref 98–111)
Creatinine: 1.35 mg/dL — ABNORMAL HIGH (ref 0.44–1.00)
GFR, Estimated: 39 mL/min — ABNORMAL LOW (ref >60)
Glucose, Bld: 112 mg/dL — ABNORMAL HIGH (ref 70–99)
Potassium: 4 mmol/L (ref 3.5–5.1)
Sodium: 139 mmol/L (ref 135–145)
Total Bilirubin: 0.3 mg/dL (ref 0.3–1.2)
Total Protein: 7.6 g/dL (ref 6.5–8.1)

## 2022-05-08 MED ORDER — CHOLESTYRAMINE 4 G PO PACK: 4.0000 g | PACK | Freq: Three times a day (TID) | ORAL | 12 refills | Status: DC

## 2022-05-08 MED ORDER — SODIUM CHLORIDE 0.9 % IV SOLN: INTRAVENOUS | Status: DC

## 2022-05-08 MED ORDER — SODIUM CHLORIDE 0.9 % IV SOLN
2.0000 g | INTRAVENOUS | Status: DC
  Filled 2022-05-08: qty 4

## 2022-05-08 MED ORDER — FULVESTRANT 250 MG/5ML IM SOSY
500.0000 mg | PREFILLED_SYRINGE | Freq: Once | INTRAMUSCULAR | Status: AC
  Administered 2022-05-08: 500 mg via INTRAMUSCULAR
  Filled 2022-05-08: qty 10

## 2022-05-08 MED ORDER — MAGNESIUM SULFATE 2 GM/50ML IV SOLN
2.0000 g | Freq: Once | INTRAVENOUS | Status: AC
  Administered 2022-05-08: 2 g via INTRAVENOUS
  Filled 2022-05-08: qty 50

## 2022-05-08 NOTE — Progress Notes (Unsigned)
[facility] Cancer Follow up:    Stacey Levins, MD [address] 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   #2 status post 4 cycles of doxorubicin and cyclophosphamide,                          (i) followed by radiation therapy under the care of Dr. [name].   #3 received tamoxifen for a total of seven years    RIGHT BREAST #4  S/P biopsy of the RIGHT breast upper inner quadrant on 11/14/10 showing invasive ductal carcinoma,, grade 2, estrogen receptor 85% and progesterone receptor 57% positive, Ki67 20%, HER2 not amplified.     #5 started neoadjuvant letrozole in November 2012; switched to tamoxifen as of February 2016 due to osteopenia concerns   #6  S/P right lumpectomy with sentinel lymph node biopsy on 09/03/11 for a ypT2, ypN1a, stage IIB invasive lobular carcinoma, grade 2,estrogen receptor 97% positive, progesterone receptor 12% positive, with no HER-2 amplification.   #7 status post right breast radiation therapy under the care of Dr. [name] from 10/19/2011 to 12/03/2011.    #8 did not meet criteria for genetic testing according to her insurance company.   #9 osteopenia, with a T score of -1.8 on DEXA scan at [facility] 03/07/2013             (a) status post multiple dental extractions and implants             (b) repeat bone density at [facility] 04/02/2015 shows a T score of -2.0   METASTATIC DISEASE: April 2021 (lymph nodes, bone)   #10:  left upper extremity lymphedema led to chest CT  scan 05/09/2019 showing a 5.1 cm left subpectoral chest wall mass and thoracic lymphadenopathy              (a) CT biopsy of the chest wall mass 05/25/2019 confirms recurrent breast cancer, strongly estrogen and progesterone receptor positive, HER-2 not amplified             (b) PET scan 05/30/2019 shows a left subpectoral mass measuring 5.4 cm, with significant regional and mediastinal adenopathy, sclerotic left scapular metastasis, but no liver or lung involvement.             (c) CA 27-29 is moderately informative   #11 anastrozole started 06/02/2019; palbociclib added 06/08/2019             (a) palbociclib taken irregularly for several months secondary to cytopenias             (b) palbociclib dose decreased to 100 mg daily 21 on 7 off October 2021             (c) CT of the chest with contrast 11/09/2019 shows mild disease progression (despite a continuing drop in the CA 27-29)             (d) fulvestrant added 11/23/2019             (  e) repeat chest CT with contrast 09/09/2020 read as stable.   #12 denosumab/Xgeva starting 06/08/2019, to be repeated every 3 months due to hypocalcemia             (a) treatment held after August 2022 dose secondary to ongoing dental issues   #13 genetics testing 06/15/2019 through the [facility] Common Hereditary Cancers Panel found no deleterious mutations in APC, ATM, AXIN2, BARD1, BMPR1A, BRCA1, BRCA2, BRIP1, CDH1, CDKN2A (p14ARF), CDKN2A (p16INK4a), CKD4, CHEK2, CTNNA1, DICER1, EPCAM (Deletion/duplication testing only), GREM1 (promoter region deletion/duplication testing only), KIT, MEN1, MLH1, MSH2, MSH3, MSH6, MUTYH, NBN, NF1, NHTL1, PALB2, PDGFRA, PMS2, POLD1, POLE, PTEN, RAD50, RAD51C, RAD51D, RNF43, SDHB, SDHC, SDHD, SMAD4, SMARCA4. STK11, TP53, TSC1, TSC2, and VHL.  The following genes were evaluated for sequence changes only: SDHA and HOXB13 c.251G>A variant only.  CURRENT THERAPY: Anastrozole, palbociclib, fulvestrant  INTERVAL HISTORY:  Stacey [name]  82 y.o. female returns for follow-up.  Since last visit, she had a DVT right femoral vein, right popliteal vein. She is now on xarelto for blood thinners. She complains of ongoing soreness in the right leg intermittently. She continues to have diarrhea with each meal. She was given some lomotil last time when she saw our NP. She said she was off to the moon, walking off the walls, never want to take again.  Patient Active Problem List   Diagnosis Date Noted   Acute deep vein thrombosis (DVT) of femoral vein of right lower extremity 04/13/2022   Acute upper respiratory infection 05/16/2021   Nasal dryness 02/04/2021   Chronic sinusitis 11/25/2020   Allergic rhinitis 07/08/2020   Hypomagnesemia 06/10/2020   Goals of care, counseling/discussion 06/21/2019   Family history of breast cancer    Family history of pancreatic cancer    Family history of uterine cancer    Family history of lymphoma    Family history of lung cancer    Malignant neoplasm metastatic to bone 06/02/2019   Recurrent cancer of left breast 05/15/2019   Pes anserine bursitis 03/31/2019   HTN (hypertension) 02/10/2019   Hyperglycemia 06/21/2018   Left carpal tunnel syndrome 02/22/2018   Rotator cuff arthropathy of left shoulder 09/20/2017   Arthritis of hand 08/23/2017   Pain in right hand 08/23/2017   Cervical radiculitis 04/09/2017   Left shoulder pain 04/09/2017   Dyspnea on exertion 05/14/2016   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   Lymphedema of left arm 01/14/2016   Left arm swelling 12/25/2015   SVT (supraventricular tachycardia)    Left-sided carotid artery disease 03/08/2015   Dizziness 01/24/2015   Urinary frequency 01/13/2015   Dizziness and giddiness 01/09/2015   Gallstones 02/21/2013   Malignant neoplasm of upper-inner quadrant of left breast in female, estrogen receptor positive 11/28/2012   Muscle cramping 09/12/2012   Right hip pain 09/12/2012   Lower back pain  09/12/2012   COPD GOLD I     Hx of radiation therapy    Right shoulder pain 09/14/2011   Preventative health care 07/29/2010   Skin lesion of left leg 07/29/2010   Paresthesia 07/29/2010   GERD 03/28/2010   CONSTIPATION 03/28/2010   Osteoarthrosis, 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  MEDICAL HISTORY:  Past Medical History:  Diagnosis Date   ANEMIA-NOS    ANXIETY    Breast cancer (HCC) 1997 L, 2012 R   s/p chemo/xrt   COPD    resolved   DIVERTICULOSIS, COLON 2008   Dizziness    Family history of breast cancer    Family history of lung cancer    Family history of lymphoma    Family history of pancreatic cancer    Family history of uterine cancer    GERD    Hx of radiation therapy 10/19/11 -12/03/11   right breast   HYPERLIPIDEMIA    IRRITABLE BOWEL SYNDROME, HX OF    Left-sided carotid artery disease (HCC)    moderate left ICA stenosis   OSTEOARTHRITIS, HAND    PSVT (paroxysmal supraventricular tachycardia)    symptomatic on event monitor    SURGICAL HISTORY: Past Surgical History:  Procedure Laterality Date   ABDOMINAL HYSTERECTOMY     APPENDECTOMY     BREAST BIOPSY  11/14/10    r breast: inv, insitu mammary carcinoma w/calcif, er/pr +, her2 -   BREAST SURGERY     lumpectomy   CATARACT EXTRACTION     both eyes   ELECTROPHYSIOLOGIC STUDY N/A 05/10/2015   Procedure: SVT Ablation;  Surgeon: Will Jorja Loa, MD;  Location: [facility] INVASIVE CV [facility];  Service: Cardiovascular;  Laterality: N/A;   HERNIA REPAIR     inguinal herniorrhapy  1984   left   IR US GUIDE BX ASP/DRAIN  05/26/2019   rectal fissure repair     s/p benign breast biopsy  2003   right   s/p left  foot surgury  2009   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   spiral fx left foot  2008   no surgury   TMJ ARTHROPLASTY  1989   TONGUE SURGERY     1988- to remove scar tissue growth    TONSILLECTOMY      SOCIAL HISTORY: Social History   Socioeconomic History   Marital status: Divorced    Spouse name: 2 Step-children   Number of children: 2   Years of education: Not on file   Highest education level: Not on file  Occupational History   Occupation: retired Training and development officer  Tobacco Use   Smoking status: Former    Packs/day: 1.00    Years: 54.00    Additional pack years: 0.00    Total pack years: 54.00    Types: Cigarettes    Quit date: 01/03/2016    Years since quitting: 6.3   Smokeless tobacco: Never  Vaping Use   Vaping Use: Never used  Substance and Sexual Activity   Alcohol use: Yes    Comment: rare/ drinks socially   Drug use: No   Sexual activity: Never  Other Topics Concern   Not on file  Social History Narrative   Patient gets no regular exercise   No biological children   2 step children   Social Determinants of Health   Financial Resource Strain: Low Risk  (12/02/2021)   Overall Financial Resource Strain (CARDIA)    Difficulty of Paying Living Expenses: Not hard at all  Food Insecurity: No Food Insecurity (12/02/2021)   Hunger Vital Sign    Worried About Running Out of Food in the Last Year: Never true    Ran Out of Food in the Last Year: Never true  Transportation Needs: No Transportation Needs (12/02/2021)   PRAPARE - Transportation    Lack of Transportation (  Medical): No    Lack of Transportation (Non-Medical): No  Physical Activity: Sufficiently Active (12/02/2021)   Exercise Vital Sign    Days of Exercise per Week: 5 days    Minutes of Exercise per Session: 30 min  Stress: No Stress Concern Present (12/02/2021)   [employer] of Occupational Health - Occupational Stress Questionnaire    Feeling of Stress : Only a little  Social  Connections: Socially Isolated (12/02/2021)   Social Connection and Isolation Panel [NHANES]    Frequency of Communication with Friends and Family: More than three times a week    Frequency of Social Gatherings with Friends and Family: More than three times a week    Attends Religious Services: Never    Database administrator or Organizations: No    Attends Banker Meetings: Never    Marital Status: Divorced  Catering manager Violence: Not At Risk (12/02/2021)   Humiliation, Afraid, Rape, and Kick questionnaire    Fear of Current or Ex-Partner: No    Emotionally Abused: No    Physically Abused: No    Sexually Abused: No    FAMILY HISTORY: Family History  Problem Relation Age of Onset   Hypertension Mother    Stroke Mother    Colon polyps Mother    Diabetes Mother    Pancreatic cancer Mother 77   Uterine cancer Mother 32   Lymphoma Brother        burkitts   Lung cancer Paternal Uncle    Lung cancer Maternal Grandmother 73       non-smoker   Lung cancer Maternal Grandfather    Breast cancer Cousin        maternal cousin, dx in her mid 68s   Brain cancer Cousin        maternal cousin's son; dx in his 36s   Testicular cancer Cousin        maternal cousin's son;    Breast cancer Cousin        paternal cousin; dx in her 38s   Breast cancer Cousin        paternal cousin's daughter; dx in 60s; neg genetic testing   Colon cancer Neg Hx    Esophageal cancer Neg Hx    Stomach cancer Neg Hx    Rectal cancer Neg Hx     Review of Systems  Constitutional:  Negative for appetite change, chills, fatigue, fever and unexpected weight change.  HENT:   Negative for hearing loss, lump/mass and trouble swallowing.   Eyes:  Negative for eye problems and icterus.  Respiratory:  Negative for chest tightness, cough and shortness of breath.   Cardiovascular:  Positive for leg swelling. Negative for chest pain and palpitations.  Gastrointestinal:  Negative for abdominal  distention, abdominal pain, constipation, diarrhea, nausea and vomiting.  Endocrine: Negative for hot flashes.  Genitourinary:  Negative for difficulty urinating.   Musculoskeletal:  Negative for arthralgias.  Skin:  Negative for itching and rash.  Neurological:  Negative for dizziness, extremity weakness, headaches and numbness.  Hematological:  Negative for adenopathy. Does not bruise/bleed easily.  Psychiatric/Behavioral:  Negative for depression. The patient is not nervous/anxious.       PHYSICAL EXAMINATION  ECOG PERFORMANCE STATUS: 1 - Symptomatic but completely ambulatory  Vitals:   05/08/22 1253  BP: (!) 129/53  Pulse: 90  Resp: 16  Temp: 97.7 F (36.5 C)  SpO2: 96%    Physical Exam Constitutional:      General: She is not  in acute distress.    Appearance: Normal appearance. She is not toxic-appearing.  HENT:     Head: Normocephalic and atraumatic.  Eyes:     General: No scleral icterus. Cardiovascular:     Rate and Rhythm: Normal rate and regular rhythm.     Pulses: Normal pulses.     Heart sounds: Normal heart sounds.  Pulmonary:     Effort: Pulmonary effort is normal.     Breath sounds: Normal breath sounds.  Abdominal:     General: Abdomen is flat. Bowel sounds are normal. There is no distension.     Palpations: Abdomen is soft.     Tenderness: There is no abdominal tenderness.  Musculoskeletal:        General: Swelling (Right leg swelling) present.     Cervical back: Neck supple.  Lymphadenopathy:     Cervical: No cervical adenopathy.  Skin:    General: Skin is warm and dry.     Findings: No rash.  Neurological:     General: No focal deficit present.     Mental Status: She is alert.  Psychiatric:        Mood and Affect: Mood normal.        Behavior: Behavior normal.     LABORATORY DATA:  CBC    Component Value Date/Time   WBC 2.4 (L) 05/08/2022 1211   WBC 3.2 (L) 06/16/2021 0924   RBC 2.43 (L) 05/08/2022 1211   HGB 8.7 (L) 05/08/2022  1211   HGB 12.4 12/09/2015 1408   HCT 25.6 (L) 05/08/2022 1211   HCT 36.2 12/09/2015 1408   PLT 133 (L) 05/08/2022 1211   PLT 238 12/09/2015 1408   MCV 105.3 (H) 05/08/2022 1211   MCV 89.8 12/09/2015 1408   MCH 35.8 (H) 05/08/2022 1211   MCHC 34.0 05/08/2022 1211   RDW 14.7 05/08/2022 1211   RDW 13.2 12/09/2015 1408   LYMPHSABS PENDING 05/08/2022 1211   LYMPHSABS 2.1 12/09/2015 1408   MONOABS PENDING 05/08/2022 1211   MONOABS 0.5 12/09/2015 1408   EOSABS PENDING 05/08/2022 1211   EOSABS 0.2 12/09/2015 1408   BASOSABS PENDING 05/08/2022 1211   BASOSABS 0.0 12/09/2015 1408    CMP     Component Value Date/Time   NA 139 05/08/2022 1211   NA 140 12/09/2015 1409   K 4.0 05/08/2022 1211   K 4.2 12/09/2015 1409   CL 109 05/08/2022 1211   CL 107 02/02/2012 0939   CO2 19 (L) 05/08/2022 1211   CO2 25 12/09/2015 1409   GLUCOSE 112 (H) 05/08/2022 1211   GLUCOSE 102 12/09/2015 1409   GLUCOSE 104 (H) 02/02/2012 0939   BUN 38 (H) 05/08/2022 1211   BUN 16.7 12/09/2015 1409   CREATININE 1.35 (H) 05/08/2022 1211   CREATININE 0.8 12/09/2015 1409   CALCIUM 9.3 05/08/2022 1211   CALCIUM 9.4 12/09/2015 1409   PROT 7.6 05/08/2022 1211   PROT 7.0 12/09/2015 1409   ALBUMIN 4.2 05/08/2022 1211   ALBUMIN 3.3 (L) 12/09/2015 1409   AST 17 05/08/2022 1211   AST 20 12/09/2015 1409   ALT 10 05/08/2022 1211   ALT 13 12/09/2015 1409   ALKPHOS 50 05/08/2022 1211   ALKPHOS 54 12/09/2015 1409   BILITOT 0.3 05/08/2022 1211   BILITOT <0.22 12/09/2015 1409   GFRNONAA 39 (L) 05/08/2022 1211   GFRAA >60 10/26/2019 1347   GFRAA >60 05/09/2019 1303   RADIOGRAPHIC STUDIES:  No results found.     ASSESSMENT and THERAPY PLAN:  No problem-specific Assessment & Plan notes found for this encounter.   All questions were answered. The patient knows to call the clinic with any problems, questions or concerns. We can certainly see the patient much sooner if necessary.  Total encounter time:30  minutes*in face-to-face visit time, chart review, lab review, care coordination, order entry, and documentation of the encounter time.     *Total Encounter Time as defined by the Centers for Medicare and Medicaid Services includes, in addition to the face-to-face time of a patient visit (documented in the note above) non-face-to-face time: obtaining and reviewing outside history, ordering and reviewing medications, tests or procedures, care coordination (communications with other health care professionals or caregivers) and documentation in the medical record.

## 2022-05-09 ENCOUNTER — Encounter: Payer: Self-pay | Admitting: Hematology and Oncology

## 2022-05-09 LAB — CANCER ANTIGEN 27.29: CA 27.29: 34.3 U/mL (ref 0.0–38.6)

## 2022-05-10 ENCOUNTER — Encounter: Payer: Self-pay | Admitting: Oncology

## 2022-05-11 ENCOUNTER — Other Ambulatory Visit: Payer: Self-pay | Admitting: *Deleted

## 2022-06-04 ENCOUNTER — Other Ambulatory Visit: Payer: Self-pay | Admitting: *Deleted

## 2022-06-04 DIAGNOSIS — D630 Anemia in neoplastic disease: Secondary | ICD-10-CM

## 2022-06-04 DIAGNOSIS — C7951 Secondary malignant neoplasm of bone: Secondary | ICD-10-CM

## 2022-06-05 ENCOUNTER — Inpatient Hospital Stay: Payer: Medicare Other | Admitting: Hematology and Oncology

## 2022-06-05 ENCOUNTER — Inpatient Hospital Stay: Payer: Medicare Other | Attending: Adult Health

## 2022-06-05 ENCOUNTER — Inpatient Hospital Stay: Payer: Medicare Other

## 2022-06-05 ENCOUNTER — Other Ambulatory Visit: Payer: Self-pay | Admitting: *Deleted

## 2022-06-05 VITALS — BP 135/69 | HR 87 | Temp 97.9°F | Resp 17

## 2022-06-05 DIAGNOSIS — Z9223 Personal history of estrogen therapy: Secondary | ICD-10-CM | POA: Diagnosis not present

## 2022-06-05 DIAGNOSIS — Z170 Estrogen receptor positive status [ER+]: Secondary | ICD-10-CM | POA: Insufficient documentation

## 2022-06-05 DIAGNOSIS — Z79818 Long term (current) use of other agents affecting estrogen receptors and estrogen levels: Secondary | ICD-10-CM | POA: Insufficient documentation

## 2022-06-05 DIAGNOSIS — C7951 Secondary malignant neoplasm of bone: Secondary | ICD-10-CM

## 2022-06-05 DIAGNOSIS — C50212 Malignant neoplasm of upper-inner quadrant of left female breast: Secondary | ICD-10-CM | POA: Diagnosis not present

## 2022-06-05 DIAGNOSIS — Z79899 Other long term (current) drug therapy: Secondary | ICD-10-CM | POA: Insufficient documentation

## 2022-06-05 DIAGNOSIS — Z803 Family history of malignant neoplasm of breast: Secondary | ICD-10-CM | POA: Insufficient documentation

## 2022-06-05 DIAGNOSIS — J449 Chronic obstructive pulmonary disease, unspecified: Secondary | ICD-10-CM | POA: Diagnosis not present

## 2022-06-05 DIAGNOSIS — Z79810 Long term (current) use of selective estrogen receptor modulators (SERMs): Secondary | ICD-10-CM | POA: Diagnosis not present

## 2022-06-05 DIAGNOSIS — Z800 Family history of malignant neoplasm of digestive organs: Secondary | ICD-10-CM | POA: Diagnosis not present

## 2022-06-05 DIAGNOSIS — Z87891 Personal history of nicotine dependence: Secondary | ICD-10-CM | POA: Diagnosis not present

## 2022-06-05 DIAGNOSIS — K219 Gastro-esophageal reflux disease without esophagitis: Secondary | ICD-10-CM | POA: Diagnosis not present

## 2022-06-05 DIAGNOSIS — Z801 Family history of malignant neoplasm of trachea, bronchus and lung: Secondary | ICD-10-CM | POA: Insufficient documentation

## 2022-06-05 DIAGNOSIS — Z86718 Personal history of other venous thrombosis and embolism: Secondary | ICD-10-CM | POA: Insufficient documentation

## 2022-06-05 DIAGNOSIS — D630 Anemia in neoplastic disease: Secondary | ICD-10-CM

## 2022-06-05 DIAGNOSIS — M8580 Other specified disorders of bone density and structure, unspecified site: Secondary | ICD-10-CM | POA: Insufficient documentation

## 2022-06-05 DIAGNOSIS — I10 Essential (primary) hypertension: Secondary | ICD-10-CM | POA: Diagnosis not present

## 2022-06-05 LAB — CMP (CANCER CENTER ONLY)
ALT: 9 U/L (ref 0–44)
AST: 16 U/L (ref 15–41)
Albumin: 4.2 g/dL (ref 3.5–5.0)
Alkaline Phosphatase: 56 U/L (ref 38–126)
Anion gap: 9 (ref 5–15)
BUN: 34 mg/dL — ABNORMAL HIGH (ref 8–23)
CO2: 18 mmol/L — ABNORMAL LOW (ref 22–32)
Calcium: 9.2 mg/dL (ref 8.9–10.3)
Chloride: 112 mmol/L — ABNORMAL HIGH (ref 98–111)
Creatinine: 1.53 mg/dL — ABNORMAL HIGH (ref 0.44–1.00)
GFR, Estimated: 34 mL/min — ABNORMAL LOW (ref >60)
Glucose, Bld: 117 mg/dL — ABNORMAL HIGH (ref 70–99)
Potassium: 4.6 mmol/L (ref 3.5–5.1)
Sodium: 139 mmol/L (ref 135–145)
Total Bilirubin: 0.3 mg/dL (ref 0.3–1.2)
Total Protein: 7.6 g/dL (ref 6.5–8.1)

## 2022-06-05 LAB — CBC WITH DIFFERENTIAL (CANCER CENTER ONLY)
Abs Immature Granulocytes: 0.01 10*3/uL (ref 0.00–0.07)
Basophils Absolute: 0 10*3/uL (ref 0.0–0.1)
Basophils Relative: 1 %
Eosinophils Absolute: 0 10*3/uL (ref 0.0–0.5)
Eosinophils Relative: 1 %
HCT: 26.6 % — ABNORMAL LOW (ref 36.0–46.0)
Hemoglobin: 8.8 g/dL — ABNORMAL LOW (ref 12.0–15.0)
Immature Granulocytes: 0 %
Lymphocytes Relative: 22 %
Lymphs Abs: 0.6 10*3/uL — ABNORMAL LOW (ref 0.7–4.0)
MCH: 35.9 pg — ABNORMAL HIGH (ref 26.0–34.0)
MCHC: 33.1 g/dL (ref 30.0–36.0)
MCV: 108.6 fL — ABNORMAL HIGH (ref 80.0–100.0)
Monocytes Absolute: 0.4 10*3/uL (ref 0.1–1.0)
Monocytes Relative: 14 %
Neutro Abs: 1.8 10*3/uL (ref 1.7–7.7)
Neutrophils Relative %: 62 %
Platelet Count: 183 10*3/uL (ref 150–400)
RBC: 2.45 MIL/uL — ABNORMAL LOW (ref 3.87–5.11)
RDW: 14.6 % (ref 11.5–15.5)
Smear Review: NORMAL
WBC Count: 3 10*3/uL — ABNORMAL LOW (ref 4.0–10.5)
nRBC: 0 % (ref 0.0–0.2)

## 2022-06-05 LAB — MAGNESIUM: Magnesium: 1.2 mg/dL — ABNORMAL LOW (ref 1.7–2.4)

## 2022-06-05 MED ORDER — FULVESTRANT 250 MG/5ML IM SOSY
500.0000 mg | PREFILLED_SYRINGE | Freq: Once | INTRAMUSCULAR | Status: AC
  Administered 2022-06-05: 500 mg via INTRAMUSCULAR
  Filled 2022-06-05: qty 10

## 2022-06-05 MED ORDER — SODIUM CHLORIDE 0.9 % IV SOLN: Freq: Once | INTRAVENOUS | Status: AC

## 2022-06-05 MED ORDER — MAGNESIUM SULFATE 2 GM/50ML IV SOLN
2.0000 g | Freq: Once | INTRAVENOUS | Status: AC
  Administered 2022-06-05: 2 g via INTRAVENOUS
  Filled 2022-06-05: qty 50

## 2022-06-05 NOTE — Patient Instructions (Signed)
Magnesium Sulfate Injection What is this medication? MAGNESIUM SULFATE (mag NEE zee um SUL fate) prevents and treats low levels of magnesium in your body. It may also be used to prevent and treat seizures during pregnancy in people with high blood pressure disorders, such as preeclampsia or eclampsia. Magnesium plays an important role in maintaining the health of your muscles and nervous system. This medicine may be used for other purposes; ask your health care provider or pharmacist if you have questions. What should I tell my care team before I take this medication? They need to know if you have any of these conditions: Heart disease History of irregular heart beat Kidney disease An unusual or allergic reaction to magnesium sulfate, medications, foods, dyes, or preservatives Pregnant or trying to get pregnant Breast-feeding How should I use this medication? This medication is for infusion into a vein. It is given in a hospital or clinic setting. Talk to your care team about the use of this medication in children. While this medication may be prescribed for selected conditions, precautions do apply. Overdosage: If you think you have taken too much of this medicine contact a poison control center or emergency room at once. NOTE: This medicine is only for you. Do not share this medicine with others. What if I miss a dose? This does not apply. What may interact with this medication? Certain medications for anxiety or sleep Certain medications for seizures, such phenobarbital Digoxin Medications that relax muscles for surgery Narcotic medications for pain This list may not describe all possible interactions. Give your health care provider a list of all the medicines, herbs, non-prescription drugs, or dietary supplements you use. Also tell them if you smoke, drink alcohol, or use illegal drugs. Some items may interact with your medicine. What should I watch for while using this  medication? Your condition will be monitored carefully while you are receiving this medication. You may need blood work done while you are receiving this medication. What side effects may I notice from receiving this medication? Side effects that you should report to your care team as soon as possible: Allergic reactions--skin rash, itching, hives, swelling of the face, lips, tongue, or throat High magnesium level--confusion, drowsiness, facial flushing, redness, sweating, muscle weakness, fast or irregular heartbeat, trouble breathing Low blood pressure--dizziness, feeling faint or lightheaded, blurry vision Side effects that usually do not require medical attention (report to your care team if they continue or are bothersome): Headache Nausea This list may not describe all possible side effects. Call your doctor for medical advice about side effects. You may report side effects to FDA at 1-800-FDA-1088. Where should I keep my medication? This medication is given in a hospital or clinic and will not be stored at home. NOTE: This sheet is a summary. It may not cover all possible information. If you have questions about this medicine, talk to your doctor, pharmacist, or health care provider.  2023 Elsevier/Gold Standard (2012-05-27 00:00:00)  

## 2022-06-06 LAB — CANCER ANTIGEN 27.29: CA 27.29: 37.8 U/mL (ref 0.0–38.6)

## 2022-06-11 ENCOUNTER — Other Ambulatory Visit (HOSPITAL_COMMUNITY): Payer: Self-pay

## 2022-06-11 ENCOUNTER — Other Ambulatory Visit: Payer: Self-pay

## 2022-06-12 ENCOUNTER — Encounter: Payer: Self-pay | Admitting: *Deleted

## 2022-06-26 ENCOUNTER — Other Ambulatory Visit: Payer: Self-pay | Admitting: Hematology and Oncology

## 2022-07-03 ENCOUNTER — Inpatient Hospital Stay (HOSPITAL_BASED_OUTPATIENT_CLINIC_OR_DEPARTMENT_OTHER): Payer: Medicare Other | Admitting: Hematology and Oncology

## 2022-07-03 ENCOUNTER — Inpatient Hospital Stay: Payer: Medicare Other

## 2022-07-03 VITALS — BP 145/58 | HR 96 | Temp 97.7°F | Resp 18 | Wt 141.3 lb

## 2022-07-03 VITALS — BP 128/63 | HR 91 | Temp 97.7°F | Resp 18

## 2022-07-03 DIAGNOSIS — Z170 Estrogen receptor positive status [ER+]: Secondary | ICD-10-CM

## 2022-07-03 DIAGNOSIS — C50212 Malignant neoplasm of upper-inner quadrant of left female breast: Secondary | ICD-10-CM

## 2022-07-03 DIAGNOSIS — Z79818 Long term (current) use of other agents affecting estrogen receptors and estrogen levels: Secondary | ICD-10-CM | POA: Diagnosis not present

## 2022-07-03 DIAGNOSIS — Z79810 Long term (current) use of selective estrogen receptor modulators (SERMs): Secondary | ICD-10-CM | POA: Diagnosis not present

## 2022-07-03 DIAGNOSIS — C7951 Secondary malignant neoplasm of bone: Secondary | ICD-10-CM | POA: Diagnosis not present

## 2022-07-03 DIAGNOSIS — Z86718 Personal history of other venous thrombosis and embolism: Secondary | ICD-10-CM | POA: Diagnosis not present

## 2022-07-03 LAB — CBC WITH DIFFERENTIAL/PLATELET
Abs Immature Granulocytes: 0 10*3/uL (ref 0.00–0.07)
Basophils Absolute: 0 10*3/uL (ref 0.0–0.1)
Basophils Relative: 1 %
Eosinophils Absolute: 0 10*3/uL (ref 0.0–0.5)
Eosinophils Relative: 1 %
HCT: 25.7 % — ABNORMAL LOW (ref 36.0–46.0)
Hemoglobin: 8.8 g/dL — ABNORMAL LOW (ref 12.0–15.0)
Immature Granulocytes: 0 %
Lymphocytes Relative: 24 %
Lymphs Abs: 0.6 10*3/uL — ABNORMAL LOW (ref 0.7–4.0)
MCH: 36.2 pg — ABNORMAL HIGH (ref 26.0–34.0)
MCHC: 34.2 g/dL (ref 30.0–36.0)
MCV: 105.8 fL — ABNORMAL HIGH (ref 80.0–100.0)
Monocytes Absolute: 0.3 10*3/uL (ref 0.1–1.0)
Monocytes Relative: 12 %
Neutro Abs: 1.5 10*3/uL — ABNORMAL LOW (ref 1.7–7.7)
Neutrophils Relative %: 62 %
Platelets: 149 10*3/uL — ABNORMAL LOW (ref 150–400)
RBC: 2.43 MIL/uL — ABNORMAL LOW (ref 3.87–5.11)
RDW: 14.4 % (ref 11.5–15.5)
Smear Review: NORMAL
WBC: 2.4 10*3/uL — ABNORMAL LOW (ref 4.0–10.5)
nRBC: 0 % (ref 0.0–0.2)

## 2022-07-03 LAB — CMP (CANCER CENTER ONLY)
ALT: 8 U/L (ref 0–44)
AST: 19 U/L (ref 15–41)
Albumin: 4.2 g/dL (ref 3.5–5.0)
Alkaline Phosphatase: 51 U/L (ref 38–126)
Anion gap: 13 (ref 5–15)
BUN: 29 mg/dL — ABNORMAL HIGH (ref 8–23)
CO2: 19 mmol/L — ABNORMAL LOW (ref 22–32)
Calcium: 9.2 mg/dL (ref 8.9–10.3)
Chloride: 106 mmol/L (ref 98–111)
Creatinine: 1.27 mg/dL — ABNORMAL HIGH (ref 0.44–1.00)
GFR, Estimated: 42 mL/min — ABNORMAL LOW (ref >60)
Glucose, Bld: 107 mg/dL — ABNORMAL HIGH (ref 70–99)
Potassium: 4.3 mmol/L (ref 3.5–5.1)
Sodium: 138 mmol/L (ref 135–145)
Total Bilirubin: 0.2 mg/dL — ABNORMAL LOW (ref 0.3–1.2)
Total Protein: 7.6 g/dL (ref 6.5–8.1)

## 2022-07-03 LAB — SAMPLE TO BLOOD BANK

## 2022-07-03 LAB — VITAMIN B12: Vitamin B-12: 277 pg/mL (ref 180–914)

## 2022-07-03 LAB — MAGNESIUM: Magnesium: 1.1 mg/dL — ABNORMAL LOW (ref 1.7–2.4)

## 2022-07-03 MED ORDER — MAGNESIUM SULFATE 2 GM/50ML IV SOLN
2.0000 g | Freq: Once | INTRAVENOUS | Status: AC
  Administered 2022-07-03: 2 g via INTRAVENOUS
  Filled 2022-07-03: qty 50

## 2022-07-03 MED ORDER — FULVESTRANT 250 MG/5ML IM SOSY
500.0000 mg | PREFILLED_SYRINGE | Freq: Once | INTRAMUSCULAR | Status: AC
  Administered 2022-07-03: 500 mg via INTRAMUSCULAR
  Filled 2022-07-03: qty 10

## 2022-07-03 NOTE — Patient Instructions (Signed)
Magnesium Sulfate Injection What is this medication? MAGNESIUM SULFATE (mag NEE zee um SUL fate) prevents and treats low levels of magnesium in your body. It may also be used to prevent and treat seizures during pregnancy in people with high blood pressure disorders, such as preeclampsia or eclampsia. Magnesium plays an important role in maintaining the health of your muscles and nervous system. This medicine may be used for other purposes; ask your health care provider or pharmacist if you have questions. What should I tell my care team before I take this medication? They need to know if you have any of these conditions: Heart disease History of irregular heart beat Kidney disease An unusual or allergic reaction to magnesium sulfate, medications, foods, dyes, or preservatives Pregnant or trying to get pregnant Breast-feeding How should I use this medication? This medication is for infusion into a vein. It is given in a hospital or clinic setting. Talk to your care team about the use of this medication in children. While this medication may be prescribed for selected conditions, precautions do apply. Overdosage: If you think you have taken too much of this medicine contact a poison control center or emergency room at once. NOTE: This medicine is only for you. Do not share this medicine with others. What if I miss a dose? This does not apply. What may interact with this medication? Certain medications for anxiety or sleep Certain medications for seizures, such phenobarbital Digoxin Medications that relax muscles for surgery Narcotic medications for pain This list may not describe all possible interactions. Give your health care provider a list of all the medicines, herbs, non-prescription drugs, or dietary supplements you use. Also tell them if you smoke, drink alcohol, or use illegal drugs. Some items may interact with your medicine. What should I watch for while using this  medication? Your condition will be monitored carefully while you are receiving this medication. You may need blood work done while you are receiving this medication. What side effects may I notice from receiving this medication? Side effects that you should report to your care team as soon as possible: Allergic reactions--skin rash, itching, hives, swelling of the face, lips, tongue, or throat High magnesium level--confusion, drowsiness, facial flushing, redness, sweating, muscle weakness, fast or irregular heartbeat, trouble breathing Low blood pressure--dizziness, feeling faint or lightheaded, blurry vision Side effects that usually do not require medical attention (report to your care team if they continue or are bothersome): Headache Nausea This list may not describe all possible side effects. Call your doctor for medical advice about side effects. You may report side effects to FDA at 1-800-FDA-1088. Where should I keep my medication? This medication is given in a hospital or clinic and will not be stored at home. NOTE: This sheet is a summary. It may not cover all possible information. If you have questions about this medicine, talk to your doctor, pharmacist, or health care provider.  2023 Elsevier/Gold Standard (2012-05-27 00:00:00)  

## 2022-07-03 NOTE — Progress Notes (Signed)
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   #2 status post 4 cycles of doxorubicin and cyclophosphamide,                          (i) followed by radiation therapy under the care of Dr. [name].   #3 received tamoxifen for a total of seven years    RIGHT BREAST #4  S/P biopsy of the RIGHT breast upper inner quadrant on 11/14/10 showing invasive ductal carcinoma,, grade 2, estrogen receptor 85% and progesterone receptor 57% positive, Ki67 20%, HER2 not amplified.     #5 started neoadjuvant letrozole in November 2012; switched to tamoxifen as of February 2016 due to osteopenia concerns   #6  S/P right lumpectomy with sentinel lymph node biopsy on 09/03/11 for a ypT2, ypN1a, stage IIB invasive lobular carcinoma, grade 2,estrogen receptor 97% positive, progesterone receptor 12% positive, with no HER-2 amplification.   #7 status post right breast radiation therapy under the care of Dr. [name] from 10/19/2011 to 12/03/2011.    #8 did not meet criteria for genetic testing according to her insurance company.   #9 osteopenia, with a T score of -1.8 on DEXA scan at [facility] 03/07/2013             (a) status post multiple dental extractions and implants             (b) repeat bone density at [facility] 04/02/2015 shows a T score of -2.0   METASTATIC DISEASE: April 2021 (lymph nodes, bone)   #10:  left upper extremity lymphedema led to  chest CT scan 05/09/2019 showing a 5.1 cm left subpectoral chest wall mass and thoracic lymphadenopathy              (a) CT biopsy of the chest wall mass 05/25/2019 confirms recurrent breast cancer, strongly estrogen and progesterone receptor positive, HER-2 not amplified             (b) PET scan 05/30/2019 shows a left subpectoral mass measuring 5.4 cm, with significant regional and mediastinal adenopathy, sclerotic left scapular metastasis, but no liver or lung involvement.             (c) CA 27-29 is moderately informative   #11 anastrozole started 06/02/2019; palbociclib added 06/08/2019             (a) palbociclib taken irregularly for several months secondary to cytopenias             (b) palbociclib dose decreased to 100 mg daily 21 on 7 off October 2021             (c) CT of the chest with contrast 11/09/2019 shows mild disease progression (despite a continuing drop in the CA 27-29)             (d) fulvestrant added 11/23/2019             (  e) repeat chest CT with contrast 09/09/2020 read as stable.   #12 denosumab/Xgeva starting 06/08/2019, to be repeated every 3 months due to hypocalcemia             (a) treatment held after August 2022 dose secondary to ongoing dental issues   #13 genetics testing 06/15/2019 through the [facility] Common Hereditary Cancers Panel found no deleterious mutations in APC, ATM, AXIN2, BARD1, BMPR1A, BRCA1, BRCA2, BRIP1, CDH1, CDKN2A (p14ARF), CDKN2A (p16INK4a), CKD4, CHEK2, CTNNA1, DICER1, EPCAM (Deletion/duplication testing only), GREM1 (promoter region deletion/duplication testing only), KIT, MEN1, MLH1, MSH2, MSH3, MSH6, MUTYH, NBN, NF1, NHTL1, PALB2, PDGFRA, PMS2, POLD1, POLE, PTEN, RAD50, RAD51C, RAD51D, RNF43, SDHB, SDHC, SDHD, SMAD4, SMARCA4. STK11, TP53, TSC1, TSC2, and VHL.  The following genes were evaluated for sequence changes only: SDHA and HOXB13 c.251G>A variant only.  CURRENT THERAPY: Anastrozole, palbociclib, fulvestrant  INTERVAL  HISTORY:  Stacey [name] 82 y.o. female returns for follow-up.  She had dental surgery on April 24 th, had to have her upper teeth removed. She can only eat soft foods for the past month. She has been dragging since her dental surgery. Besides the fatigue, she denies any major change in breathing. No change in bowel habits or urinary habits. She has some intermittent diarrhea.   Patient Active Problem List   Diagnosis Date Noted   Acute deep vein thrombosis (DVT) of femoral vein of right lower extremity (HCC) 04/13/2022   Acute upper respiratory infection 05/16/2021   Nasal dryness 02/04/2021   Chronic sinusitis 11/25/2020   Allergic rhinitis 07/08/2020   Hypomagnesemia 06/10/2020   Goals of care, counseling/discussion 06/21/2019   Family history of breast cancer    Family history of pancreatic cancer    Family history of uterine cancer    Family history of lymphoma    Family history of lung cancer    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HCC) 06/02/2019   Recurrent cancer of left breast (HCC) 05/15/2019   Pes anserine bursitis 03/31/2019   HTN (hypertension) 02/10/2019   Hyperglycemia 06/21/2018   Left carpal tunnel syndrome 02/22/2018   Rotator cuff arthropathy of left shoulder 09/20/2017   Arthritis of hand 08/23/2017   Pain in right hand 08/23/2017   Cervical radiculitis 04/09/2017   Left shoulder pain 04/09/2017   Dyspnea on exertion 05/14/2016   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   Lymphedema of left arm 01/14/2016   Left arm swelling 12/25/2015   SVT (supraventricular tachycardia)    Left-sided carotid artery disease (HCC) 03/08/2015   Dizziness 01/24/2015   Urinary frequency 01/13/2015   Dizziness and giddiness 01/09/2015   Gallstones 02/21/2013   Malignant neoplasm of upper-inner quadrant of left breast in female, estrogen receptor positive (HCC) 11/28/2012   Muscle cramping 09/12/2012   Right hip pain 09/12/2012   Lower back pain  09/12/2012   COPD GOLD I     Hx of radiation therapy    Right shoulder pain 09/14/2011   Preventative health care 07/29/2010   Skin lesion of left leg 07/29/2010   Paresthesia 07/29/2010   GERD 03/28/2010   CONSTIPATION 03/28/2010   Osteoarthrosis, 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  MEDICAL HISTORY: Past Medical History:  Diagnosis Date  ANEMIA-NOS    ANXIETY    Breast cancer (HCC) 1997 L, 2012 R   s/p chemo/xrt   COPD    resolved   DIVERTICULOSIS, COLON 2008   Dizziness    Family history of breast cancer    Family history of lung cancer    Family history of lymphoma    Family history of pancreatic cancer    Family history of uterine cancer    GERD    Hx of radiation therapy 10/19/11 -12/03/11   right breast   HYPERLIPIDEMIA    IRRITABLE BOWEL SYNDROME, HX OF    Left-sided carotid artery disease (HCC)    moderate left ICA stenosis   OSTEOARTHRITIS, HAND    PSVT (paroxysmal supraventricular tachycardia)    symptomatic on event monitor    SURGICAL HISTORY: Past Surgical History:  Procedure Laterality Date   ABDOMINAL HYSTERECTOMY     APPENDECTOMY     BREAST BIOPSY  11/14/10    r breast: inv, insitu mammary carcinoma w/calcif, er/pr +, her2 -   BREAST SURGERY     lumpectomy   CATARACT EXTRACTION     both eyes   ELECTROPHYSIOLOGIC STUDY N/A 05/10/2015   Procedure: SVT Ablation;  Surgeon: Will Jorja Loa, MD;  Location: [facility] IN[facility]ASIVE [facility];  Service: Cardiovascular;  Laterality: N/A;   HERNIA REPAIR     inguinal herniorrhapy  1984   left   IR US GUIDE BX ASP/DRAIN  05/26/2019   rectal fissure repair     s/p benign breast biopsy  2003   right   s/p left  foot surgury  2009   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   spiral fx left foot  2008   no surgury   TMJ ARTHROPLASTY  1989   TONGUE SURGERY     1988- to remove scar tissue growth    TONSILLECTOMY      SOCIAL HISTORY: Social History   Socioeconomic History   Marital status: Divorced    Spouse name: 2 Step-children   Number of children: 2   Years of education: Not on file   Highest education level: Not on file  Occupational History   Occupation: retired Training and development officer  Tobacco Use   Smoking status: Former    Packs/day: 1.00    Years: 54.00    Additional pack years: 0.00    Total pack years: 54.00    Types: Cigarettes    Quit date: 01/03/2016    Years since quitting: 6.5   Smokeless tobacco: Never  Vaping Use   Vaping Use: Never used  Substance and Sexual Activity   Alcohol use: Yes    Comment: rare/ drinks socially   Drug use: No   Sexual activity: Never  Other Topics Concern   Not on file  Social History Narrative   Patient gets no regular exercise   No biological children   2 step children   Social Determinants of Health   Financial Resource Strain: Low Risk  (12/02/2021)   Overall Financial Resource Strain (CARDIA)    Difficulty of Paying Living Expenses: Not hard at all  Food Insecurity: No Food Insecurity (12/02/2021)   Hunger Vital Sign    Worried About Running Out of Food in the Last Year: Never true    Ran Out of Food in the Last Year: Never true  Transportation Needs: No Transportation Needs (12/02/2021)   PRAPARE - Administrator, Civil Service (Medical): No    Lack of Transportation (  Non-Medical): No  Physical Activity: Sufficiently Active (12/02/2021)   Exercise Vital Sign    Days of Exercise per Week: 5 days    Minutes of Exercise per Session: 30 min  Stress: No Stress Concern Present (12/02/2021)   [employer] of Occupational Health - Occupational Stress Questionnaire    Feeling of Stress : Only a little  Social  Connections: Socially Isolated (12/02/2021)   Social Connection and Isolation Panel [NHANES]    Frequency of Communication with Friends and Family: More than three times a week    Frequency of Social Gatherings with Friends and Family: More than three times a week    Attends Religious Services: Never    Database administrator or Organizations: No    Attends Banker Meetings: Never    Marital Status: Divorced  Catering manager Violence: Not At Risk (12/02/2021)   Humiliation, Afraid, Rape, and Kick questionnaire    Fear of Current or Ex-Partner: No    Emotionally Abused: No    Physically Abused: No    Sexually Abused: No    FAMILY HISTORY: Family History  Problem Relation Age of Onset   Hypertension Mother    Stroke Mother    Colon polyps Mother    Diabetes Mother    Pancreatic cancer Mother 52   Uterine cancer Mother 37   Lymphoma Brother        burkitts   Lung cancer Paternal Uncle    Lung cancer Maternal Grandmother 76       non-smoker   Lung cancer Maternal Grandfather    Breast cancer Cousin        maternal cousin, dx in her mid 6s   Brain cancer Cousin        maternal cousin's son; dx in his 84s   Testicular cancer Cousin        maternal cousin's son;    Breast cancer Cousin        paternal cousin; dx in her 59s   Breast cancer Cousin        paternal cousin's daughter; dx in 38s; neg genetic testing   Colon cancer Neg Hx    Esophageal cancer Neg Hx    Stomach cancer Neg Hx    Rectal cancer Neg Hx     Review of Systems  Constitutional:  Negative for appetite change, chills, fatigue, fever and unexpected weight change.  HENT:   Negative for hearing loss, lump/mass and trouble swallowing.   Eyes:  Negative for eye problems and icterus.  Respiratory:  Negative for chest tightness, cough and shortness of breath.   Cardiovascular:  Positive for leg swelling (right greater than left). Negative for chest pain and palpitations.  Gastrointestinal:   Negative for abdominal distention, abdominal pain, constipation, diarrhea, nausea and vomiting.  Endocrine: Negative for hot flashes.  Genitourinary:  Negative for difficulty urinating.   Musculoskeletal:  Negative for arthralgias.  Skin:  Negative for itching and rash.  Neurological:  Negative for dizziness, extremity weakness, headaches and numbness.  Hematological:  Negative for adenopathy. Does not bruise/bleed easily.  Psychiatric/Behavioral:  Negative for depression. The patient is not nervous/anxious.       PHYSICAL EXAMINATION  ECOG PERFORMANCE STATUS: 1 - Symptomatic but completely ambulatory  Vitals:   07/03/22 1309  BP: (!) 145/58  Pulse: 96  Resp: 18  Temp: 97.7 F (36.5 C)  SpO2: 97%    Physical Exam Constitutional:      General: She is not in acute distress.  Appearance: Normal appearance. She is not toxic-appearing.  HENT:     Head: Normocephalic and atraumatic.  Eyes:     General: No scleral icterus. Cardiovascular:     Rate and Rhythm: Normal rate and regular rhythm.     Pulses: Normal pulses.     Heart sounds: Normal heart sounds.  Pulmonary:     Effort: Pulmonary effort is normal.     Breath sounds: Normal breath sounds.  Abdominal:     General: Abdomen is flat. Bowel sounds are normal. There is no distension.     Palpations: Abdomen is soft.     Tenderness: There is no abdominal tenderness.  Musculoskeletal:        General: Swelling (Right leg swelling) present.     Cervical back: Neck supple.  Lymphadenopathy:     Cervical: No cervical adenopathy.  Skin:    General: Skin is warm and dry.     Findings: No rash.  Neurological:     General: No focal deficit present.     Mental Status: She is alert.  Psychiatric:        Mood and Affect: Mood normal.        Behavior: Behavior normal.     LABORATORY DATA:  CBC    Component Value Date/Time   WBC 3.0 (L) 06/05/2022 1314   WBC 3.2 (L) 06/16/2021 0924   RBC 2.45 (L) 06/05/2022 1314    HGB 8.8 (L) 06/05/2022 1314   HGB 12.4 12/09/2015 1408   HCT 26.6 (L) 06/05/2022 1314   HCT 36.2 12/09/2015 1408   PLT 183 06/05/2022 1314   PLT 238 12/09/2015 1408   MCV 108.6 (H) 06/05/2022 1314   MCV 89.8 12/09/2015 1408   MCH 35.9 (H) 06/05/2022 1314   MCHC 33.1 06/05/2022 1314   RDW 14.6 06/05/2022 1314   RDW 13.2 12/09/2015 1408   LYMPHSABS 0.6 (L) 06/05/2022 1314   LYMPHSABS 2.1 12/09/2015 1408   MONOABS 0.4 06/05/2022 1314   MONOABS 0.5 12/09/2015 1408   EOSABS 0.0 06/05/2022 1314   EOSABS 0.2 12/09/2015 1408   BASOSABS 0.0 06/05/2022 1314   BASOSABS 0.0 12/09/2015 1408    CMP     Component Value Date/Time   NA 139 06/05/2022 1314   NA 140 12/09/2015 1409   K 4.6 06/05/2022 1314   K 4.2 12/09/2015 1409   CL 112 (H) 06/05/2022 1314   CL 107 02/02/2012 0939   CO2 18 (L) 06/05/2022 1314   CO2 25 12/09/2015 1409   GLUCOSE 117 (H) 06/05/2022 1314   GLUCOSE 102 12/09/2015 1409   GLUCOSE 104 (H) 02/02/2012 0939   BUN 34 (H) 06/05/2022 1314   BUN 16.7 12/09/2015 1409   CREATININE 1.53 (H) 06/05/2022 1314   CREATININE 0.8 12/09/2015 1409   CALCIUM 9.2 06/05/2022 1314   CALCIUM 9.4 12/09/2015 1409   PROT 7.6 06/05/2022 1314   PROT 7.0 12/09/2015 1409   ALBUMIN 4.2 06/05/2022 1314   ALBUMIN 3.3 (L) 12/09/2015 1409   AST 16 06/05/2022 1314   AST 20 12/09/2015 1409   ALT 9 06/05/2022 1314   ALT 13 12/09/2015 1409   ALKPHOS 56 06/05/2022 1314   ALKPHOS 54 12/09/2015 1409   BILITOT 0.3 06/05/2022 1314   BILITOT <0.22 12/09/2015 1409   GFRNONAA 34 (L) 06/05/2022 1314   GFRAA >60 10/26/2019 1347   GFRAA >60 05/09/2019 1303   RADIOGRAPHIC STUDIES:  No results found.  ASSESSMENT and THERAPY PLAN:    This is a very pleasant  82 year old female patient with metastatic breast cancer currently on palbociclib, and anastrozole as well as fulvestrant since about May 2021 who is here for follow-up.     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 She continues on  Ibrance/faslodex/anastrozole Most recent imaging with no progression Labs consistent with Ibrance related cytopenia No concerns on exam today for progression.   She will continue Faslodex injection every 28 days Repeat imaging anticipated in Aug 2024      DVT right lower extremity Continue anticoagulation. No concerns for worsening DVT She has some swelling and tenderness in the right lower extremity, however not ideal candidate for NSAIDs hence have encouraged her to use warm compresses and leg elevation and continue monitoring.   Diarrhea, related to treatment Continue imodium PRN Added cholestyramine   Hypomagnesemia, continue Mg replacement every 4 weeks.   All questions were answered. The patient knows to call the clinic with any problems, questions or concerns. We can certainly see the patient much sooner if necessary.   Total encounter time:30 minutes*in face-to-face visit time, chart review, lab review, care coordination, order entry, and documentation of the encounter time.

## 2022-07-04 LAB — CANCER ANTIGEN 27.29: CA 27.29: 43.3 U/mL — ABNORMAL HIGH (ref 0.0–38.6)

## 2022-07-06 LAB — FOLATE RBC
Folate, Hemolysate: 439 ng/mL
Folate, RBC: 1702 ng/mL (ref >498)
Hematocrit: 25.8 % — ABNORMAL LOW (ref 34.0–46.6)

## 2022-07-07 ENCOUNTER — Other Ambulatory Visit: Payer: Self-pay | Admitting: Internal Medicine

## 2022-07-07 ENCOUNTER — Other Ambulatory Visit (HOSPITAL_COMMUNITY): Payer: Self-pay

## 2022-07-12 ENCOUNTER — Encounter: Payer: Self-pay | Admitting: Hematology and Oncology

## 2022-07-20 ENCOUNTER — Other Ambulatory Visit (HOSPITAL_COMMUNITY): Payer: Self-pay

## 2022-07-22 ENCOUNTER — Telehealth: Payer: Self-pay | Admitting: Hematology and Oncology

## 2022-07-22 NOTE — Telephone Encounter (Signed)
Left patient a vm regarding upcoming appointment  

## 2022-07-31 ENCOUNTER — Inpatient Hospital Stay: Payer: Medicare Other | Attending: Adult Health

## 2022-07-31 ENCOUNTER — Other Ambulatory Visit: Payer: Self-pay

## 2022-07-31 ENCOUNTER — Inpatient Hospital Stay: Payer: Medicare Other

## 2022-07-31 VITALS — BP 145/74 | HR 87 | Temp 97.9°F | Resp 18

## 2022-07-31 DIAGNOSIS — C50212 Malignant neoplasm of upper-inner quadrant of left female breast: Secondary | ICD-10-CM | POA: Diagnosis not present

## 2022-07-31 DIAGNOSIS — Z79899 Other long term (current) drug therapy: Secondary | ICD-10-CM | POA: Diagnosis not present

## 2022-07-31 DIAGNOSIS — C7951 Secondary malignant neoplasm of bone: Secondary | ICD-10-CM

## 2022-07-31 DIAGNOSIS — Z79818 Long term (current) use of other agents affecting estrogen receptors and estrogen levels: Secondary | ICD-10-CM | POA: Diagnosis not present

## 2022-07-31 DIAGNOSIS — Z170 Estrogen receptor positive status [ER+]: Secondary | ICD-10-CM

## 2022-07-31 DIAGNOSIS — D630 Anemia in neoplastic disease: Secondary | ICD-10-CM

## 2022-07-31 LAB — CBC WITH DIFFERENTIAL (CANCER CENTER ONLY)
Abs Immature Granulocytes: 0.01 10*3/uL (ref 0.00–0.07)
Basophils Absolute: 0 10*3/uL (ref 0.0–0.1)
Basophils Relative: 1 %
Eosinophils Absolute: 0 10*3/uL (ref 0.0–0.5)
Eosinophils Relative: 2 %
HCT: 28.6 % — ABNORMAL LOW (ref 36.0–46.0)
Hemoglobin: 9.6 g/dL — ABNORMAL LOW (ref 12.0–15.0)
Immature Granulocytes: 1 %
Lymphocytes Relative: 25 %
Lymphs Abs: 0.5 10*3/uL — ABNORMAL LOW (ref 0.7–4.0)
MCH: 36.5 pg — ABNORMAL HIGH (ref 26.0–34.0)
MCHC: 33.6 g/dL (ref 30.0–36.0)
MCV: 108.7 fL — ABNORMAL HIGH (ref 80.0–100.0)
Monocytes Absolute: 0.3 10*3/uL (ref 0.1–1.0)
Monocytes Relative: 14 %
Neutro Abs: 1.2 10*3/uL — ABNORMAL LOW (ref 1.7–7.7)
Neutrophils Relative %: 57 %
Platelet Count: 113 10*3/uL — ABNORMAL LOW (ref 150–400)
RBC: 2.63 MIL/uL — ABNORMAL LOW (ref 3.87–5.11)
RDW: 14.5 % (ref 11.5–15.5)
Smear Review: NORMAL
WBC Count: 2.1 10*3/uL — ABNORMAL LOW (ref 4.0–10.5)
nRBC: 0 % (ref 0.0–0.2)

## 2022-07-31 LAB — CMP (CANCER CENTER ONLY)
ALT: 10 U/L (ref 0–44)
AST: 19 U/L (ref 15–41)
Albumin: 4.1 g/dL (ref 3.5–5.0)
Alkaline Phosphatase: 49 U/L (ref 38–126)
Anion gap: 11 (ref 5–15)
BUN: 31 mg/dL — ABNORMAL HIGH (ref 8–23)
CO2: 21 mmol/L — ABNORMAL LOW (ref 22–32)
Calcium: 10 mg/dL (ref 8.9–10.3)
Chloride: 107 mmol/L (ref 98–111)
Creatinine: 1.4 mg/dL — ABNORMAL HIGH (ref 0.44–1.00)
GFR, Estimated: 38 mL/min — ABNORMAL LOW (ref >60)
Glucose, Bld: 109 mg/dL — ABNORMAL HIGH (ref 70–99)
Potassium: 4.3 mmol/L (ref 3.5–5.1)
Sodium: 139 mmol/L (ref 135–145)
Total Bilirubin: 0.2 mg/dL — ABNORMAL LOW (ref 0.3–1.2)
Total Protein: 7.8 g/dL (ref 6.5–8.1)

## 2022-07-31 LAB — MAGNESIUM: Magnesium: 1.3 mg/dL — ABNORMAL LOW (ref 1.7–2.4)

## 2022-07-31 MED ORDER — SODIUM CHLORIDE 0.9 % IV SOLN: INTRAVENOUS | Status: DC

## 2022-07-31 MED ORDER — MAGNESIUM SULFATE 2 GM/50ML IV SOLN
2.0000 g | Freq: Once | INTRAVENOUS | Status: AC
  Administered 2022-07-31: 2 g via INTRAVENOUS
  Filled 2022-07-31: qty 50

## 2022-07-31 MED ORDER — FULVESTRANT 250 MG/5ML IM SOSY
500.0000 mg | PREFILLED_SYRINGE | Freq: Once | INTRAMUSCULAR | Status: AC
  Administered 2022-07-31: 500 mg via INTRAMUSCULAR
  Filled 2022-07-31: qty 10

## 2022-07-31 NOTE — Patient Instructions (Signed)
Magnesium Sulfate Injection What is this medication? MAGNESIUM SULFATE (mag NEE zee um SUL fate) prevents and treats low levels of magnesium in your body. It may also be used to prevent and treat seizures during pregnancy in people with high blood pressure disorders, such as preeclampsia or eclampsia. Magnesium plays an important role in maintaining the health of your muscles and nervous system. This medicine may be used for other purposes; ask your health care provider or pharmacist if you have questions. What should I tell my care team before I take this medication? They need to know if you have any of these conditions: Heart disease History of irregular heart beat Kidney disease An unusual or allergic reaction to magnesium sulfate, medications, foods, dyes, or preservatives Pregnant or trying to get pregnant Breast-feeding How should I use this medication? This medication is for infusion into a vein. It is given in a hospital or clinic setting. Talk to your care team about the use of this medication in children. While this medication may be prescribed for selected conditions, precautions do apply. Overdosage: If you think you have taken too much of this medicine contact a poison control center or emergency room at once. NOTE: This medicine is only for you. Do not share this medicine with others. What if I miss a dose? This does not apply. What may interact with this medication? Certain medications for anxiety or sleep Certain medications for seizures, such phenobarbital Digoxin Medications that relax muscles for surgery Narcotic medications for pain This list may not describe all possible interactions. Give your health care provider a list of all the medicines, herbs, non-prescription drugs, or dietary supplements you use. Also tell them if you smoke, drink alcohol, or use illegal drugs. Some items may interact with your medicine. What should I watch for while using this  medication? Your condition will be monitored carefully while you are receiving this medication. You may need blood work done while you are receiving this medication. What side effects may I notice from receiving this medication? Side effects that you should report to your care team as soon as possible: Allergic reactions--skin rash, itching, hives, swelling of the face, lips, tongue, or throat High magnesium level--confusion, drowsiness, facial flushing, redness, sweating, muscle weakness, fast or irregular heartbeat, trouble breathing Low blood pressure--dizziness, feeling faint or lightheaded, blurry vision Side effects that usually do not require medical attention (report to your care team if they continue or are bothersome): Headache Nausea This list may not describe all possible side effects. Call your doctor for medical advice about side effects. You may report side effects to FDA at 1-800-FDA-1088. Where should I keep my medication? This medication is given in a hospital or clinic and will not be stored at home. NOTE: This sheet is a summary. It may not cover all possible information. If you have questions about this medicine, talk to your doctor, pharmacist, or health care provider.  2023 Elsevier/Gold Standard (2012-05-27 00:00:00)  

## 2022-08-04 DIAGNOSIS — H04123 Dry eye syndrome of bilateral lacrimal glands: Secondary | ICD-10-CM | POA: Diagnosis not present

## 2022-08-04 DIAGNOSIS — H40013 Open angle with borderline findings, low risk, bilateral: Secondary | ICD-10-CM | POA: Diagnosis not present

## 2022-08-04 DIAGNOSIS — H04563 Stenosis of bilateral lacrimal punctum: Secondary | ICD-10-CM | POA: Diagnosis not present

## 2022-08-13 ENCOUNTER — Other Ambulatory Visit (HOSPITAL_COMMUNITY): Payer: Self-pay

## 2022-08-17 ENCOUNTER — Other Ambulatory Visit: Payer: Self-pay

## 2022-08-23 ENCOUNTER — Encounter: Payer: Self-pay | Admitting: Internal Medicine

## 2022-08-24 ENCOUNTER — Other Ambulatory Visit: Payer: Self-pay

## 2022-08-24 MED ORDER — TELMISARTAN 40 MG PO TABS: 40.0000 mg | ORAL_TABLET | Freq: Every morning | ORAL | 0 refills | Status: DC

## 2022-08-28 ENCOUNTER — Inpatient Hospital Stay: Payer: Medicare Other

## 2022-08-28 ENCOUNTER — Inpatient Hospital Stay: Payer: Medicare Other | Admitting: Adult Health

## 2022-08-28 ENCOUNTER — Inpatient Hospital Stay: Payer: Medicare Other | Attending: Adult Health

## 2022-08-28 ENCOUNTER — Inpatient Hospital Stay (HOSPITAL_BASED_OUTPATIENT_CLINIC_OR_DEPARTMENT_OTHER): Payer: Medicare Other | Admitting: Adult Health

## 2022-08-28 ENCOUNTER — Other Ambulatory Visit: Payer: Self-pay

## 2022-08-28 ENCOUNTER — Encounter: Payer: Self-pay | Admitting: Adult Health

## 2022-08-28 VITALS — BP 136/64 | HR 88 | Temp 97.4°F | Resp 18 | Ht 63.0 in | Wt 143.1 lb

## 2022-08-28 DIAGNOSIS — C50212 Malignant neoplasm of upper-inner quadrant of left female breast: Secondary | ICD-10-CM | POA: Insufficient documentation

## 2022-08-28 DIAGNOSIS — Z801 Family history of malignant neoplasm of trachea, bronchus and lung: Secondary | ICD-10-CM | POA: Insufficient documentation

## 2022-08-28 DIAGNOSIS — Z79818 Long term (current) use of other agents affecting estrogen receptors and estrogen levels: Secondary | ICD-10-CM | POA: Diagnosis not present

## 2022-08-28 DIAGNOSIS — J449 Chronic obstructive pulmonary disease, unspecified: Secondary | ICD-10-CM | POA: Insufficient documentation

## 2022-08-28 DIAGNOSIS — C7951 Secondary malignant neoplasm of bone: Secondary | ICD-10-CM

## 2022-08-28 DIAGNOSIS — Z86718 Personal history of other venous thrombosis and embolism: Secondary | ICD-10-CM | POA: Diagnosis not present

## 2022-08-28 DIAGNOSIS — K219 Gastro-esophageal reflux disease without esophagitis: Secondary | ICD-10-CM | POA: Diagnosis not present

## 2022-08-28 DIAGNOSIS — I471 Supraventricular tachycardia, unspecified: Secondary | ICD-10-CM | POA: Insufficient documentation

## 2022-08-28 DIAGNOSIS — Z800 Family history of malignant neoplasm of digestive organs: Secondary | ICD-10-CM | POA: Diagnosis not present

## 2022-08-28 DIAGNOSIS — I10 Essential (primary) hypertension: Secondary | ICD-10-CM | POA: Insufficient documentation

## 2022-08-28 DIAGNOSIS — R350 Frequency of micturition: Secondary | ICD-10-CM | POA: Diagnosis not present

## 2022-08-28 DIAGNOSIS — Z79810 Long term (current) use of selective estrogen receptor modulators (SERMs): Secondary | ICD-10-CM | POA: Diagnosis not present

## 2022-08-28 DIAGNOSIS — Z923 Personal history of irradiation: Secondary | ICD-10-CM | POA: Diagnosis not present

## 2022-08-28 DIAGNOSIS — I779 Disorder of arteries and arterioles, unspecified: Secondary | ICD-10-CM | POA: Insufficient documentation

## 2022-08-28 DIAGNOSIS — Z170 Estrogen receptor positive status [ER+]: Secondary | ICD-10-CM | POA: Diagnosis not present

## 2022-08-28 DIAGNOSIS — D630 Anemia in neoplastic disease: Secondary | ICD-10-CM | POA: Diagnosis not present

## 2022-08-28 DIAGNOSIS — M8580 Other specified disorders of bone density and structure, unspecified site: Secondary | ICD-10-CM | POA: Diagnosis not present

## 2022-08-28 DIAGNOSIS — E785 Hyperlipidemia, unspecified: Secondary | ICD-10-CM | POA: Insufficient documentation

## 2022-08-28 DIAGNOSIS — Z87891 Personal history of nicotine dependence: Secondary | ICD-10-CM | POA: Diagnosis not present

## 2022-08-28 DIAGNOSIS — Z803 Family history of malignant neoplasm of breast: Secondary | ICD-10-CM | POA: Diagnosis not present

## 2022-08-28 DIAGNOSIS — Z9221 Personal history of antineoplastic chemotherapy: Secondary | ICD-10-CM | POA: Diagnosis not present

## 2022-08-28 DIAGNOSIS — J320 Chronic maxillary sinusitis: Secondary | ICD-10-CM

## 2022-08-28 LAB — CBC WITH DIFFERENTIAL (CANCER CENTER ONLY)
Abs Immature Granulocytes: 0.01 10*3/uL (ref 0.00–0.07)
Basophils Absolute: 0 10*3/uL (ref 0.0–0.1)
Basophils Relative: 1 %
Eosinophils Absolute: 0 10*3/uL (ref 0.0–0.5)
Eosinophils Relative: 1 %
HCT: 26.4 % — ABNORMAL LOW (ref 36.0–46.0)
Hemoglobin: 9.1 g/dL — ABNORMAL LOW (ref 12.0–15.0)
Immature Granulocytes: 0 %
Lymphocytes Relative: 20 %
Lymphs Abs: 0.5 10*3/uL — ABNORMAL LOW (ref 0.7–4.0)
MCH: 36.3 pg — ABNORMAL HIGH (ref 26.0–34.0)
MCHC: 34.5 g/dL (ref 30.0–36.0)
MCV: 105.2 fL — ABNORMAL HIGH (ref 80.0–100.0)
Monocytes Absolute: 0.3 10*3/uL (ref 0.1–1.0)
Monocytes Relative: 14 %
Neutro Abs: 1.5 10*3/uL — ABNORMAL LOW (ref 1.7–7.7)
Neutrophils Relative %: 64 %
Platelet Count: 119 10*3/uL — ABNORMAL LOW (ref 150–400)
RBC: 2.51 MIL/uL — ABNORMAL LOW (ref 3.87–5.11)
RDW: 13.6 % (ref 11.5–15.5)
WBC Count: 2.3 10*3/uL — ABNORMAL LOW (ref 4.0–10.5)
nRBC: 0 % (ref 0.0–0.2)

## 2022-08-28 LAB — CMP (CANCER CENTER ONLY)
ALT: 10 U/L (ref 0–44)
AST: 21 U/L (ref 15–41)
Albumin: 4.3 g/dL (ref 3.5–5.0)
Alkaline Phosphatase: 57 U/L (ref 38–126)
Anion gap: 11 (ref 5–15)
BUN: 36 mg/dL — ABNORMAL HIGH (ref 8–23)
CO2: 20 mmol/L — ABNORMAL LOW (ref 22–32)
Calcium: 9.6 mg/dL (ref 8.9–10.3)
Chloride: 105 mmol/L (ref 98–111)
Creatinine: 1.45 mg/dL — ABNORMAL HIGH (ref 0.44–1.00)
GFR, Estimated: 36 mL/min — ABNORMAL LOW (ref >60)
Glucose, Bld: 98 mg/dL (ref 70–99)
Potassium: 4.4 mmol/L (ref 3.5–5.1)
Sodium: 136 mmol/L (ref 135–145)
Total Bilirubin: 0.3 mg/dL (ref 0.3–1.2)
Total Protein: 7.5 g/dL (ref 6.5–8.1)

## 2022-08-28 LAB — MAGNESIUM: Magnesium: 1.3 mg/dL — ABNORMAL LOW (ref 1.7–2.4)

## 2022-08-28 MED ORDER — ZOLPIDEM TARTRATE 10 MG PO TABS: 10.0000 mg | ORAL_TABLET | Freq: Every evening | ORAL | 0 refills | Status: DC | PRN

## 2022-08-28 MED ORDER — MAGNESIUM SULFATE 2 GM/50ML IV SOLN
2.0000 g | Freq: Once | INTRAVENOUS | Status: AC
  Administered 2022-08-28: 2 g via INTRAVENOUS
  Filled 2022-08-28: qty 50

## 2022-08-28 MED ORDER — SODIUM CHLORIDE 0.9 % IV SOLN: INTRAVENOUS | Status: DC

## 2022-08-28 MED ORDER — FULVESTRANT 250 MG/5ML IM SOSY
500.0000 mg | PREFILLED_SYRINGE | Freq: Once | INTRAMUSCULAR | Status: AC
  Administered 2022-08-28: 500 mg via INTRAMUSCULAR

## 2022-08-28 NOTE — Assessment & Plan Note (Signed)
Stacey [name] is an 82 year old woman with metastatic breast cancer currently on treatment for with fulvestrant, palbociclib, and anastrozole.  She continues on treatment with good tolerance.  She is tolerating treatment well.  Her labs are stable today.  She will continue on her treatment as prescribed.  Hypomagesemia: She receives 2 g of IV magnesium for magnesium level less than 1.6.  Today her magnesium is 1.3 and she will receive 2 g of IV mag.  Nasal congestion: I placed a referral to ENT as requested.  RTC in 4 weeks for labs and infusion and in 8 weeks for labs, f/u, and infusion.

## 2022-08-28 NOTE — Progress Notes (Signed)
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   #2 status post 4 cycles of doxorubicin and cyclophosphamide,                          (i) followed by radiation therapy under the care of Dr. [name].   #3 received tamoxifen for a total of seven years    RIGHT BREAST #4  S/P biopsy of the RIGHT breast upper inner quadrant on 11/14/10 showing invasive ductal carcinoma,, grade 2, estrogen receptor 85% and progesterone receptor 57% positive, Ki67 20%, HER2 not amplified.     #5 started neoadjuvant letrozole in November 2012; switched to tamoxifen as of February 2016 due to osteopenia concerns   #6  S/P right lumpectomy with sentinel lymph node biopsy on 09/03/11 for a ypT2, ypN1a, stage IIB invasive lobular carcinoma, grade 2,estrogen receptor 97% positive, progesterone receptor 12% positive, with no HER-2 amplification.   #7 status post right breast radiation therapy under the care of Dr. [name] from 10/19/2011 to 12/03/2011.    #8 did not meet criteria for genetic testing according to her insurance company.   #9 osteopenia, with a T score of -1.8 on DEXA scan at [facility] 03/07/2013             (a) status post multiple dental extractions and implants             (b) repeat bone density at [facility] 04/02/2015 shows a T score of -2.0   METASTATIC DISEASE: April 2021 (lymph nodes, bone)   #10:  left upper extremity lymphedema led to chest  CT scan 05/09/2019 showing a 5.1 cm left subpectoral chest wall mass and thoracic lymphadenopathy              (a) CT biopsy of the chest wall mass 05/25/2019 confirms recurrent breast cancer, strongly estrogen and progesterone receptor positive, HER-2 not amplified             (b) PET scan 05/30/2019 shows a left subpectoral mass measuring 5.4 cm, with significant regional and mediastinal adenopathy, sclerotic left scapular metastasis, but no liver or lung involvement.             (c) CA 27-29 is moderately informative   #11 anastrozole started 06/02/2019; palbociclib added 06/08/2019             (a) palbociclib taken irregularly for several months secondary to cytopenias             (b) palbociclib dose decreased to 100 mg daily 21 on 7 off October 2021             (c) CT of the chest with contrast 11/09/2019 shows mild disease progression (despite a continuing drop in the CA 27-29)             (d) fulvestrant added 11/23/2019             (  e) repeat chest CT with contrast 09/09/2020 read as stable.   #12 denosumab/Xgeva starting 06/08/2019, to be repeated every 3 months due to hypocalcemia             (a) treatment held after August 2022 dose secondary to ongoing dental issues   #13 genetics testing 06/15/2019 through the [facility] Common Hereditary Cancers Panel found no deleterious mutations in APC, ATM, AXIN2, BARD1, BMPR1A, BRCA1, BRCA2, BRIP1, CDH1, CDKN2A (p14ARF), CDKN2A (p16INK4a), CKD4, CHEK2, CTNNA1, DICER1, EPCAM (Deletion/duplication testing only), GREM1 (promoter region deletion/duplication testing only), KIT, MEN1, MLH1, MSH2, MSH3, MSH6, MUTYH, NBN, NF1, NHTL1, PALB2, PDGFRA, PMS2, POLD1, POLE, PTEN, RAD50, RAD51C, RAD51D, RNF43, SDHB, SDHC, SDHD, SMAD4, SMARCA4. STK11, TP53, TSC1, TSC2, and VHL.  The following genes were evaluated for sequence changes only: SDHA and HOXB13 c.251G>A variant only.  CURRENT THERAPY: Anastrozole, Fulvestrant, Palbociclib  INTERVAL HISTORY: Stacey [name] 82 y.o. female returns for follow-up of her history of metastatic breast cancer.  Her most recent restaging PET scan occurred on March 18, 2022 demonstrating no evidence of hypermetabolic metastatic disease.  She is doing moderately well.  She notes that she has had some increased nasal drainage and wants to have another referral to ear nose and throat.  She is seen different ENTs in the past however has been recommended saline nasal rinsing.  Since this drainage and discharge is worsened she thinks another referral would be helpful for her.   Patient Active Problem List   Diagnosis Date Noted   Acute deep vein thrombosis (DVT) of femoral vein of right lower extremity (HCC) 04/13/2022   Acute upper respiratory infection 05/16/2021   Nasal dryness 02/04/2021   Chronic sinusitis 11/25/2020   Allergic rhinitis 07/08/2020   Hypomagnesemia 06/10/2020   Goals of care, counseling/discussion 06/21/2019   Family history of breast cancer    Family history of pancreatic cancer    Family history of uterine cancer    Family history of lymphoma    Family history of lung cancer    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HCC) 06/02/2019   Recurrent cancer of left breast (HCC) 05/15/2019   Pes anserine bursitis 03/31/2019   HTN (hypertension) 02/10/2019   Hyperglycemia 06/21/2018   Left carpal tunnel syndrome 02/22/2018   Rotator cuff arthropathy of left shoulder 09/20/2017   Arthritis of hand 08/23/2017   Pain in right hand 08/23/2017   Cervical radiculitis 04/09/2017   Left shoulder pain 04/09/2017   Dyspnea on exertion 05/14/2016   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   Lymphedema of left arm 01/14/2016   Left arm swelling 12/25/2015   SVT (supraventricular tachycardia)    Left-sided carotid artery disease (HCC) 03/08/2015   Dizziness 01/24/2015   Urinary frequency 01/13/2015   Dizziness and giddiness 01/09/2015   Gallstones 02/21/2013   Malignant neoplasm  of upper-inner quadrant of left breast in female, estrogen receptor positive (HCC) 11/28/2012   Muscle cramping 09/12/2012   Right hip pain 09/12/2012   Lower back pain 09/12/2012   COPD GOLD I     Hx of radiation therapy    Right shoulder pain 09/14/2011   Preventative health care 07/29/2010   Skin lesion of left leg 07/29/2010   Paresthesia 07/29/2010   GERD 03/28/2010   CONSTIPATION 03/28/2010   Osteoarthrosis, 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   Anxiety state 05/03/2009   Hyperlipidemia 06/14/2007   FATIGUE 06/14/2007   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  MEDICAL HISTORY: Past Medical History:  Diagnosis Date   ANEMIA-NOS    ANXIETY    Breast cancer (HCC) 1997 L, 2012 R   s/p chemo/xrt   COPD    resolved   DIVERTICULOSIS, COLON 2008   Dizziness    Family history of breast cancer    Family history of lung cancer    Family history of lymphoma    Family history of pancreatic cancer    Family history of uterine cancer    GERD    Hx of radiation therapy 10/19/11 -12/03/11   right breast   HYPERLIPIDEMIA    IRRITABLE BOWEL SYNDROME, HX OF    Left-sided carotid artery disease (HCC)    moderate left ICA stenosis   OSTEOARTHRITIS, HAND    PSVT (paroxysmal supraventricular tachycardia)    symptomatic on event monitor    SURGICAL HISTORY: Past Surgical History:  Procedure Laterality Date   ABDOMINAL HYSTERECTOMY     APPENDECTOMY     BREAST BIOPSY  11/14/10    r breast: inv, insitu mammary carcinoma w/calcif, er/pr +, her2 -   BREAST SURGERY     lumpectomy   CATARACT EXTRACTION     both eyes   ELECTROPHYSIOLOGIC STUDY N/A 05/10/2015   Procedure: SVT Ablation;  Surgeon: Will Jorja Loa, MD;  Location: [facility] IN[facility]ASIVE [facility];  Service: Cardiovascular;  Laterality: N/A;    HERNIA REPAIR     inguinal herniorrhapy  1984   left   IR US GUIDE BX ASP/DRAIN  05/26/2019   rectal fissure repair     s/p benign breast biopsy  2003   right   s/p left foot surgury  2009   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   spiral fx left foot  2008   no surgury   TMJ ARTHROPLASTY  1989   TONGUE SURGERY     1988- to remove scar tissue growth    TONSILLECTOMY      SOCIAL HISTORY: Social History   Socioeconomic History   Marital status: Divorced    Spouse name: 2 Step-children   Number of children: 2   Years of education: Not on file   Highest education level: Not on file  Occupational History   Occupation: retired Training and development officer  Tobacco Use   Smoking status: Former    Current packs/day: 0.00    Average packs/day: 1 pack/day for 54.0 years (54.0 ttl pk-yrs)    Types: Cigarettes    Start date: 01/02/1962    Quit date: 01/03/2016    Years since quitting: 6.6   Smokeless tobacco: Never  Vaping Use   Vaping status: Never Used  Substance and Sexual Activity   Alcohol use: Yes    Comment: rare/ drinks socially   Drug use: No   Sexual activity: Never  Other Topics Concern   Not on file  Social History Narrative   Patient gets no regular exercise   No biological children   2 step children   Social Determinants of Health   Financial Resource Strain: Low Risk  (12/02/2021)   Overall Financial Resource Strain (CARDIA)    Difficulty of Paying Living Expenses: Not hard at all  Food Insecurity: No Food Insecurity (12/02/2021)   Hunger Vital Sign    Worried About Running Out of Food in the Last Year: Never true    Ran Out of Food  in the Last Year: Never true  Transportation Needs: No Transportation Needs (12/02/2021)   PRAPARE - Administrator, Civil Service (Medical): No    Lack of Transportation (Non-Medical): No  Physical Activity: Sufficiently Active (12/02/2021)   Exercise Vital Sign    Days of Exercise per Week: 5 days    Minutes of Exercise per  Session: 30 min  Stress: No Stress Concern Present (12/02/2021)   [employer] of Occupational Health - Occupational Stress Questionnaire    Feeling of Stress : Only a little  Social Connections: Socially Isolated (12/02/2021)   Social Connection and Isolation Panel [NHANES]    Frequency of Communication with Friends and Family: More than three times a week    Frequency of Social Gatherings with Friends and Family: More than three times a week    Attends Religious Services: Never    Database administrator or Organizations: No    Attends Banker Meetings: Never    Marital Status: Divorced  Catering manager Violence: Not At Risk (12/02/2021)   Humiliation, Afraid, Rape, and Kick questionnaire    Fear of Current or Ex-Partner: No    Emotionally Abused: No    Physically Abused: No    Sexually Abused: No    FAMILY HISTORY: Family History  Problem Relation Age of Onset   Hypertension Mother    Stroke Mother    Colon polyps Mother    Diabetes Mother    Pancreatic cancer Mother 34   Uterine cancer Mother 61   Lymphoma Brother        burkitts   Lung cancer Paternal Uncle    Lung cancer Maternal Grandmother 11       non-smoker   Lung cancer Maternal Grandfather    Breast cancer Cousin        maternal cousin, dx in her mid 28s   Brain cancer Cousin        maternal cousin's son; dx in his 16s   Testicular cancer Cousin        maternal cousin's son;    Breast cancer Cousin        paternal cousin; dx in her 49s   Breast cancer Cousin        paternal cousin's daughter; dx in 92s; neg genetic testing   Colon cancer Neg Hx    Esophageal cancer Neg Hx    Stomach cancer Neg Hx    Rectal cancer Neg Hx     Review of Systems  Constitutional:  Negative for appetite change, chills, fatigue, fever and unexpected weight change.  HENT:   Negative for hearing loss, lump/mass and trouble swallowing.   Eyes:  Negative for eye problems and icterus.  Respiratory:  Negative  for chest tightness, cough and shortness of breath.   Cardiovascular:  Negative for chest pain, leg swelling and palpitations.  Gastrointestinal:  Negative for abdominal distention, abdominal pain, constipation, diarrhea, nausea and vomiting.  Endocrine: Negative for hot flashes.  Genitourinary:  Negative for difficulty urinating.   Musculoskeletal:  Negative for arthralgias.  Skin:  Negative for itching and rash.  Neurological:  Negative for dizziness, extremity weakness, headaches and numbness.  Hematological:  Negative for adenopathy. Does not bruise/bleed easily.  Psychiatric/Behavioral:  Negative for depression. The patient is not nervous/anxious.       PHYSICAL EXAMINATION   Onc Performance Status - 08/28/22 1403       ECOG Perf Status   ECOG Perf Status Restricted in physically strenuous activity but  ambulatory and able to carry out work of a light or sedentary nature, e.g., light house work, office work      KPS SCALE   KPS % SCORE Able to carry on normal activity, minor s/s of disease             Vitals:   08/28/22 1403  BP: 136/64  Pulse: 88  Resp: 18  Temp: (!) 97.4 F (36.3 C)  SpO2: 97%    Physical Exam Constitutional:      General: She is not in acute distress.    Appearance: Normal appearance. She is not toxic-appearing.  HENT:     Head: Normocephalic and atraumatic.     Mouth/Throat:     Mouth: Mucous membranes are moist.     Pharynx: Oropharynx is clear. No oropharyngeal exudate or posterior oropharyngeal erythema.  Eyes:     General: No scleral icterus. Cardiovascular:     Rate and Rhythm: Normal rate and regular rhythm.     Pulses: Normal pulses.     Heart sounds: Normal heart sounds.  Pulmonary:     Effort: Pulmonary effort is normal.     Breath sounds: Normal breath sounds.  Abdominal:     General: Abdomen is flat. Bowel sounds are normal. There is no distension.     Palpations: Abdomen is soft.     Tenderness: There is no abdominal  tenderness.  Musculoskeletal:        General: No swelling.     Cervical back: Neck supple.  Lymphadenopathy:     Cervical: No cervical adenopathy.  Skin:    General: Skin is warm and dry.     Findings: No rash.  Neurological:     General: No focal deficit present.     Mental Status: She is alert.  Psychiatric:        Mood and Affect: Mood normal.        Behavior: Behavior normal.     LABORATORY DATA:  CBC    Component Value Date/Time   WBC 2.3 (L) 08/28/2022 1346   WBC 2.4 (L) 07/03/2022 1345   RBC 2.51 (L) 08/28/2022 1346   HGB 9.1 (L) 08/28/2022 1346   HGB 12.4 12/09/2015 1408   HCT 26.4 (L) 08/28/2022 1346   HCT 25.8 (L) 07/03/2022 1345   HCT 36.2 12/09/2015 1408   PLT 119 (L) 08/28/2022 1346   PLT 238 12/09/2015 1408   MCV 105.2 (H) 08/28/2022 1346   MCV 89.8 12/09/2015 1408   MCH 36.3 (H) 08/28/2022 1346   MCHC 34.5 08/28/2022 1346   RDW 13.6 08/28/2022 1346   RDW 13.2 12/09/2015 1408   LYMPHSABS 0.5 (L) 08/28/2022 1346   LYMPHSABS 2.1 12/09/2015 1408   MONOABS 0.3 08/28/2022 1346   MONOABS 0.5 12/09/2015 1408   EOSABS 0.0 08/28/2022 1346   EOSABS 0.2 12/09/2015 1408   BASOSABS 0.0 08/28/2022 1346   BASOSABS 0.0 12/09/2015 1408    CMP     Component Value Date/Time   NA 136 08/28/2022 1346   NA 140 12/09/2015 1409   K 4.4 08/28/2022 1346   K 4.2 12/09/2015 1409   CL 105 08/28/2022 1346   CL 107 02/02/2012 0939   CO2 20 (L) 08/28/2022 1346   CO2 25 12/09/2015 1409   GLUCOSE 98 08/28/2022 1346   GLUCOSE 102 12/09/2015 1409   GLUCOSE 104 (H) 02/02/2012 0939   BUN 36 (H) 08/28/2022 1346   BUN 16.7 12/09/2015 1409   CREATININE 1.45 (H) 08/28/2022 1346  CREATININE 0.8 12/09/2015 1409   CALCIUM 9.6 08/28/2022 1346   CALCIUM 9.4 12/09/2015 1409   PROT 7.5 08/28/2022 1346   PROT 7.0 12/09/2015 1409   ALBUMIN 4.3 08/28/2022 1346   ALBUMIN 3.3 (L) 12/09/2015 1409   AST 21 08/28/2022 1346   AST 20 12/09/2015 1409   ALT 10 08/28/2022 1346   ALT 13  12/09/2015 1409   ALKPHOS 57 08/28/2022 1346   ALKPHOS 54 12/09/2015 1409   BILITOT 0.3 08/28/2022 1346   BILITOT <0.22 12/09/2015 1409   GFRNONAA 36 (L) 08/28/2022 1346   GFRAA >60 10/26/2019 1347   GFRAA >60 05/09/2019 1303          ASSESSMENT and THERAPY PLAN:   Malignant neoplasm metastatic to bone [facility]) Stacey [name] is an 82 year old woman with metastatic breast cancer currently on treatment for with fulvestrant, palbociclib, and anastrozole.  She continues on treatment with good tolerance.  She is tolerating treatment well.  Her labs are stable today.  She will continue on her treatment as prescribed.  Hypomagesemia: She receives 2 g of IV magnesium for magnesium level less than 1.6.  Today her magnesium is 1.3 and she will receive 2 g of IV mag.  Nasal congestion: I placed a referral to ENT as requested.  RTC in 4 weeks for labs and infusion and in 8 weeks for labs, f/u, and infusion.   All questions were answered. The patient knows to call the clinic with any problems, questions or concerns. We can certainly see the patient much sooner if necessary.  Total encounter time:40 minutes*in face-to-face visit time, chart review, lab review, care coordination, order entry, and documentation of the encounter time.    Lillard Anes, NP 08/28/22 2:54 PM [facility] [address] [place], [place] [address] Tel. [phone]    Fax. [phone]  *Total Encounter Time as defined by the Centers for Medicare and Medicaid Services includes, in addition to the face-to-face time of a patient visit (documented in the note above) non-face-to-face time: obtaining and reviewing outside history, ordering and reviewing medications, tests or procedures, care coordination (communications with other health care professionals or caregivers) and documentation in the medical record.

## 2022-09-09 ENCOUNTER — Encounter: Payer: Self-pay | Admitting: Adult Health

## 2022-09-14 ENCOUNTER — Ambulatory Visit (INDEPENDENT_AMBULATORY_CARE_PROVIDER_SITE_OTHER): Payer: Medicare Other | Admitting: Internal Medicine

## 2022-09-14 ENCOUNTER — Encounter: Payer: Self-pay | Admitting: Internal Medicine

## 2022-09-14 VITALS — BP 130/78 | HR 95 | Temp 98.1°F | Ht 63.0 in | Wt 139.0 lb

## 2022-09-14 DIAGNOSIS — I10 Essential (primary) hypertension: Secondary | ICD-10-CM | POA: Diagnosis not present

## 2022-09-14 DIAGNOSIS — R739 Hyperglycemia, unspecified: Secondary | ICD-10-CM

## 2022-09-14 DIAGNOSIS — J449 Chronic obstructive pulmonary disease, unspecified: Secondary | ICD-10-CM | POA: Diagnosis not present

## 2022-09-14 DIAGNOSIS — E781 Pure hyperglyceridemia: Secondary | ICD-10-CM | POA: Diagnosis not present

## 2022-09-14 MED ORDER — TRIAMTERENE-HCTZ 37.5-25 MG PO CAPS: 1.0000 | ORAL_CAPSULE | Freq: Every morning | ORAL | 3 refills | Status: DC

## 2022-09-14 MED ORDER — OMEPRAZOLE 20 MG PO CPDR: DELAYED_RELEASE_CAPSULE | ORAL | 3 refills | Status: DC

## 2022-09-14 MED ORDER — TELMISARTAN 40 MG PO TABS: 40.0000 mg | ORAL_TABLET | Freq: Every morning | ORAL | 3 refills | Status: DC

## 2022-09-14 NOTE — Assessment & Plan Note (Signed)
Lab Results  Component Value Date   LDLCALC 84 07/04/2009   Uncontrolled, goal ldl < 70, declines statin or lab f/u for now

## 2022-09-14 NOTE — Assessment & Plan Note (Signed)
Stable overall, cont inhaler prn 

## 2022-09-14 NOTE — Patient Instructions (Signed)
Please continue all other medications as before, and refills have been done if requested.  Please have the pharmacy call with any other refills you may need.  Please continue your efforts at being more active, low cholesterol diet, and weight control.  Please keep your appointments with your specialists as you may have planned  Please let us know if you wish further lab testing such as lipids

## 2022-09-14 NOTE — Assessment & Plan Note (Signed)
BP Readings from Last 3 Encounters:  09/14/22 130/78  08/28/22 136/64  07/31/22 (!) 145/74   Stable, pt to continue medical treatment micardis 40 every day, dyazide 1 qd

## 2022-09-14 NOTE — Progress Notes (Signed)
Patient ID: Stacey [name], female   DOB: 09-25-1940, 82 y.o.   MRN: [id number]        Chief Complaint: follow up yearly exam       HPI:  Stacey [name] is a 82 y.o. female here overall doing ok, has chronic diarrhea with ibrance, with resulting low magnesium requiring replacement;  over the years can tell sh is having gradually worsening sob doe and fatigue, but Pt denies chest pain, wheezing, orthopnea, PND, increased LE swelling, palpitations, dizziness or syncope.   Pt denies polydipsia, polyuria, or new focal neuro s/s.    Pt denies fever, night sweats, loss of appetite, or other constitutional symptoms   Wt down several lbs with low all upper teeth, awaiting dentures.  Wt Readings from Last 3 Encounters:  09/14/22 139 lb (63 kg)  08/28/22 143 lb 1.6 oz (64.9 kg)  07/03/22 141 lb 4.8 oz (64.1 kg)   BP Readings from Last 3 Encounters:  09/14/22 130/78  08/28/22 136/64  07/31/22 (!) 145/74         Past Medical History:  Diagnosis Date   ANEMIA-NOS    ANXIETY    Breast cancer (HCC) 1997 L, 2012 R   s/p chemo/xrt   COPD    resolved   DIVERTICULOSIS, COLON 2008   Dizziness    Family history of breast cancer    Family history of lung cancer    Family history of lymphoma    Family history of pancreatic cancer    Family history of uterine cancer    GERD    Hx of radiation therapy 10/19/11 -12/03/11   right breast   HYPERLIPIDEMIA    IRRITABLE BOWEL SYNDROME, HX OF    Left-sided carotid artery disease (HCC)    moderate left ICA stenosis   OSTEOARTHRITIS, HAND    PSVT (paroxysmal supraventricular tachycardia)    symptomatic on event monitor   Past Surgical History:  Procedure Laterality Date   ABDOMINAL HYSTERECTOMY     APPENDECTOMY     BREAST BIOPSY  11/14/10    r breast: inv, insitu mammary carcinoma w/calcif, er/pr +, her2 -   BREAST SURGERY     lumpectomy   CATARACT EXTRACTION     both eyes   ELECTROPHYSIOLOGIC STUDY N/A 05/10/2015   Procedure: SVT Ablation;  Surgeon:  Will Jorja Loa, MD;  Location: [facility];  Service: Cardiovascular;  Laterality: N/A;   HERNIA REPAIR     inguinal herniorrhapy  1984   left   IR US GUIDE BX ASP/DRAIN  05/26/2019   rectal fissure repair     s/p benign breast biopsy  2003   right   s/p left foot surgury  2009   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 Her smoking use included cigarettes. She started smoking about 60 years ago. She has a 54 pack-year smoking history. She has never used smokeless tobacco. She reports current alcohol use. She reports that she does not use drugs. family history includes Brain cancer in her cousin; Breast cancer in her cousin, cousin, and cousin; Colon polyps in her mother; Diabetes in her mother; Hypertension in her mother; Lung cancer in her maternal grandfather  and paternal uncle; Lung cancer (age of onset: 64) in her maternal grandmother; Lymphoma in her brother; Pancreatic cancer (age of onset: 23) in her mother; Stroke in her mother; Testicular cancer in her cousin; Uterine cancer (age of onset: 65) in her mother. Allergies  Allergen Reactions   Aminoglycosides Other (See Comments)    Unknown reaction - pt is not sure where this entry came from   Bacitracin Other (See Comments)   Bee Venom Swelling    Severe body swelling   Cephalexin Other (See Comments)   Clindamycin/Lincomycin Diarrhea and Nausea And Vomiting   Codeine Nausea And Vomiting    Pt can take codeine cough syrup.   Current Outpatient Medications on File Prior to Visit  Medication Sig Dispense Refill   acetaminophen (TYLENOL) 500 MG tablet Take 1,000 mg by mouth See admin instructions. Take 2 tablets (1000 mg) by mouth daily at bedtime, may also take 2 tablets (1000 mg) two more times during the day or night as  needed for pain     anastrozole (ARIMIDEX) 1 MG tablet TAKE 1 TABLET(1 MG) BY MOUTH DAILY 90 tablet 0   aspirin 81 MG EC tablet Take 81 mg by mouth every morning.     CALCIUM CARBONATE-VITAMIN D PO Take 1 tablet by mouth 2 (two) times daily.     Cholecalciferol (VITAMIN D3 PO) Take 1 capsule by mouth every morning.     cholestyramine (QUESTRAN) 4 g packet Take 1 packet (4 g total) by mouth 3 (three) times daily with meals. 60 each 12   clotrimazole-betamethasone (LOTRISONE) cream Apply 1 application topically 2 (two) times daily as needed. (Patient taking differently: Apply 1 application  topically 2 (two) times daily as needed (rash/yeast in corners of mouth).) 30 g 1   diphenhydrAMINE (BENADRYL) 25 MG tablet Take 25 mg by mouth every 6 (six) hours as needed (for bee stings).      fulvestrant (FASLODEX) 250 MG/5ML injection Inject 250 mg into the muscle every 28 (twenty-eight) days.     gabapentin (NEURONTIN) 100 MG capsule TAKE 1 CAPSULE(100 MG) BY MOUTH AT BEDTIME 20 capsule 0   loperamide (IMODIUM) 2 MG capsule 2-4 mg See admin instructions. Take 2 tablets (4 mg) by mouth for first loose stool, then 1 tablet (2 mg) for every loose stool thereafter. Do not exceed 8 tablets (16 mg) in a 24 hour period. Stop loperamide if 12 hours have passed without a loose stool. 30 capsule    loratadine (CLARITIN) 10 MG tablet Take 10 mg by mouth every morning.     Magnesium 500 MG CAPS Take 500 mg by mouth every morning. Take with a 250 mg capsule for a total dose of 750 mg daily     MAGNESIUM PO Take 250 mg by mouth every morning. Take with a 500 mg capsule for a total dose of 750 mg daily     Multiple Vitamin (MULTIVITAMIN WITH MINERALS) TABS tablet Take 1 tablet by mouth every morning. Centrum     palbociclib (IBRANCE) 100 MG tablet Take 1 tablet (100 mg total) by mouth Monday through Friday. Take as directed by MD. 21 tablet 6   prochlorperazine (COMPAZINE) 10 MG tablet Take 1 tablet (10 mg total) by mouth  every 6 (six) hours as needed for nausea or vomiting. 30 tablet 1   rivaroxaban (XARELTO) 20 MG TABS tablet Take 1 tablet (20 mg total) by mouth daily with supper. 30 tablet 11   triamcinolone (NASACORT) 55 MCG/ACT AERO nasal inhaler  Place 2 sprays into the nose daily. 2 sprays each nostril at night before bedtime (Patient taking differently: Place 2 sprays into the nose See admin instructions. Instill 2 sprays into each nostril every night before bedtime) 1 each 12   vitamin B-12 (CYANOCOBALAMIN) 100 MCG tablet Take 100 mcg by mouth daily.     zolpidem (AMBIEN) 10 MG tablet Take 1 tablet (10 mg total) by mouth at bedtime as needed for sleep. 30 tablet 0   No current facility-administered medications on file prior to visit.        ROS:  All others reviewed and negative.  Objective        PE:  BP 130/78 (BP Location: Right Arm, Patient Position: Sitting, Cuff Size: Normal)   Pulse 95   Temp 98.1 F (36.7 C) (Oral)   Ht 5\' 3"  (1.6 m)   Wt 139 lb (63 kg)   SpO2 98%   BMI 24.62 kg/m                 Constitutional: Pt appears in NAD               HENT: Head: NCAT.                Right Ear: External ear normal.                 Left Ear: External ear normal.                Eyes: . Pupils are equal, round, and reactive to light. Conjunctivae and EOM are normal               Nose: without d/c or deformity               Neck: Neck supple. Gross normal ROM               Cardiovascular: Normal rate and regular rhythm.                 Pulmonary/Chest: Effort normal and breath sounds without rales or wheezing.                Abd:  Soft, NT, ND, + BS, no organomegaly               Neurological: Pt is alert. At baseline orientation, motor grossly intact               Skin: Skin is warm. No rashes, no other new lesions, LE edema - none               Psychiatric: Pt behavior is normal without agitation   Micro: none  Cardiac tracings I have personally interpreted today:  none  Pertinent  Radiological findings (summarize): none   Lab Results  Component Value Date   WBC 2.3 (L) 08/28/2022   HGB 9.1 (L) 08/28/2022   HCT 26.4 (L) 08/28/2022   PLT 119 (L) 08/28/2022   GLUCOSE 98 08/28/2022   CHOL 193 07/13/2018   TRIG 231.0 (H) 07/13/2018   HDL 49.40 07/13/2018   LDLDIRECT 119.0 07/13/2018   LDLCALC 84 07/04/2009   ALT 10 08/28/2022   AST 21 08/28/2022   NA 136 08/28/2022   K 4.4 08/28/2022   CL 105 08/28/2022   CREATININE 1.45 (H) 08/28/2022   BUN 36 (H) 08/28/2022   CO2 20 (L) 08/28/2022   TSH 3.41 07/13/2018   INR 1.1 05/26/2019   HGBA1C 6.6 (H) 07/13/2018   Assessment/Plan:  Stacey [name] is a 82 y.o. White or Caucasian [1] female with  has a past medical history of ANEMIA-NOS, ANXIETY, Breast cancer (HCC) (1997 L, 2012 R), COPD, DIVERTICULOSIS, COLON (2008), Dizziness, Family history of breast cancer, Family history of lung cancer, Family history of lymphoma, Family history of pancreatic cancer, Family history of uterine cancer, GERD, radiation therapy (10/19/11 -12/03/11), HYPERLIPIDEMIA, IRRITABLE BOWEL SYNDROME, HX OF, Left-sided carotid artery disease (HCC), OSTEOARTHRITIS, HAND, and PSVT (paroxysmal supraventricular tachycardia).  Hyperlipidemia Lab Results  Component Value Date   LDLCALC 84 07/04/2009   Uncontrolled, goal ldl < 70, declines statin or lab f/u for now   Hyperglycemia Lab Results  Component Value Date   HGBA1C 6.6 (H) 07/13/2018   Uncontrolled, mild, pt to continue current medical treatment  - diet, wt control, declines f/u lab today   HTN (hypertension) BP Readings from Last 3 Encounters:  09/14/22 130/78  08/28/22 136/64  07/31/22 (!) 145/74   Stable, pt to continue medical treatment micardis 40 every day, dyazide 1 qd   COPD GOLD I  Stable overall, cont inhaler prn  Followup: Return in about 1 year (around 09/14/2023).  Oliver Barre, MD 09/14/2022 7:59 PM [facility]

## 2022-09-14 NOTE — Assessment & Plan Note (Signed)
Lab Results  Component Value Date   HGBA1C 6.6 (H) 07/13/2018   Uncontrolled, mild, pt to continue current medical treatment  - diet, wt control, declines f/u lab today

## 2022-09-21 ENCOUNTER — Other Ambulatory Visit: Payer: Self-pay | Admitting: Hematology and Oncology

## 2022-09-21 NOTE — Telephone Encounter (Signed)
Per last OV, continue anastrozole. Lorayne Marek, RN

## 2022-09-24 ENCOUNTER — Other Ambulatory Visit (HOSPITAL_COMMUNITY): Payer: Self-pay

## 2022-09-25 ENCOUNTER — Inpatient Hospital Stay: Payer: Medicare Other

## 2022-09-25 ENCOUNTER — Inpatient Hospital Stay: Payer: Medicare Other | Attending: Adult Health

## 2022-09-25 VITALS — BP 155/80 | HR 99 | Resp 18

## 2022-09-25 DIAGNOSIS — C7951 Secondary malignant neoplasm of bone: Secondary | ICD-10-CM | POA: Diagnosis not present

## 2022-09-25 DIAGNOSIS — Z170 Estrogen receptor positive status [ER+]: Secondary | ICD-10-CM | POA: Insufficient documentation

## 2022-09-25 DIAGNOSIS — Z5111 Encounter for antineoplastic chemotherapy: Secondary | ICD-10-CM | POA: Diagnosis not present

## 2022-09-25 DIAGNOSIS — C50212 Malignant neoplasm of upper-inner quadrant of left female breast: Secondary | ICD-10-CM | POA: Insufficient documentation

## 2022-09-25 DIAGNOSIS — Z79811 Long term (current) use of aromatase inhibitors: Secondary | ICD-10-CM | POA: Diagnosis not present

## 2022-09-25 DIAGNOSIS — D630 Anemia in neoplastic disease: Secondary | ICD-10-CM

## 2022-09-25 LAB — CBC WITH DIFFERENTIAL/PLATELET
Abs Immature Granulocytes: 0 10*3/uL (ref 0.00–0.07)
Basophils Absolute: 0 10*3/uL (ref 0.0–0.1)
Basophils Relative: 1 %
Eosinophils Absolute: 0.1 10*3/uL (ref 0.0–0.5)
Eosinophils Relative: 3 %
HCT: 25.6 % — ABNORMAL LOW (ref 36.0–46.0)
Hemoglobin: 9 g/dL — ABNORMAL LOW (ref 12.0–15.0)
Immature Granulocytes: 0 %
Lymphocytes Relative: 24 %
Lymphs Abs: 0.6 10*3/uL — ABNORMAL LOW (ref 0.7–4.0)
MCH: 37 pg — ABNORMAL HIGH (ref 26.0–34.0)
MCHC: 35.2 g/dL (ref 30.0–36.0)
MCV: 105.3 fL — ABNORMAL HIGH (ref 80.0–100.0)
Monocytes Absolute: 0.4 10*3/uL (ref 0.1–1.0)
Monocytes Relative: 15 %
Neutro Abs: 1.4 10*3/uL — ABNORMAL LOW (ref 1.7–7.7)
Neutrophils Relative %: 57 %
Platelets: 125 10*3/uL — ABNORMAL LOW (ref 150–400)
RBC: 2.43 MIL/uL — ABNORMAL LOW (ref 3.87–5.11)
RDW: 14.4 % (ref 11.5–15.5)
Smear Review: NORMAL
WBC: 2.4 10*3/uL — ABNORMAL LOW (ref 4.0–10.5)
nRBC: 0 % (ref 0.0–0.2)

## 2022-09-25 LAB — CMP (CANCER CENTER ONLY)
ALT: 11 U/L (ref 0–44)
AST: 18 U/L (ref 15–41)
Albumin: 4.2 g/dL (ref 3.5–5.0)
Alkaline Phosphatase: 55 U/L (ref 38–126)
Anion gap: 12 (ref 5–15)
BUN: 38 mg/dL — ABNORMAL HIGH (ref 8–23)
CO2: 20 mmol/L — ABNORMAL LOW (ref 22–32)
Calcium: 9.6 mg/dL (ref 8.9–10.3)
Chloride: 108 mmol/L (ref 98–111)
Creatinine: 1.52 mg/dL — ABNORMAL HIGH (ref 0.44–1.00)
GFR, Estimated: 34 mL/min — ABNORMAL LOW (ref >60)
Glucose, Bld: 98 mg/dL (ref 70–99)
Potassium: 4.4 mmol/L (ref 3.5–5.1)
Sodium: 140 mmol/L (ref 135–145)
Total Bilirubin: 0.2 mg/dL — ABNORMAL LOW (ref 0.3–1.2)
Total Protein: 7.9 g/dL (ref 6.5–8.1)

## 2022-09-25 LAB — MAGNESIUM: Magnesium: 1.1 mg/dL — ABNORMAL LOW (ref 1.7–2.4)

## 2022-09-25 MED ORDER — FULVESTRANT 250 MG/5ML IM SOSY
500.0000 mg | PREFILLED_SYRINGE | Freq: Once | INTRAMUSCULAR | Status: AC
  Administered 2022-09-25: 500 mg via INTRAMUSCULAR
  Filled 2022-09-25: qty 10

## 2022-09-25 MED ORDER — MAGNESIUM SULFATE 2 GM/50ML IV SOLN
2.0000 g | Freq: Once | INTRAVENOUS | Status: AC
  Administered 2022-09-25: 2 g via INTRAVENOUS
  Filled 2022-09-25: qty 50

## 2022-09-25 MED ORDER — SODIUM CHLORIDE 0.9 % IV SOLN: INTRAVENOUS | Status: DC

## 2022-09-25 NOTE — Patient Instructions (Addendum)
Fulvestrant Injection What is this medication? FULVESTRANT (ful VES trant) treats breast cancer. It works by blocking the hormone estrogen in breast tissue, which prevents breast cancer cells from spreading or growing. This medicine may be used for other purposes; ask your health care provider or pharmacist if you have questions. COMMON BRAND NAME(S): FASLODEX What should I tell my care team before I take this medication? They need to know if you have any of these conditions: Bleeding disorder Liver disease Low blood cell levels, such as low white cells, red cells, and platelets An unusual or allergic reaction to fulvestrant, other medications, foods, dyes, or preservatives Pregnant or trying to get pregnant Breast-feeding How should I use this medication? This medication is injected into a muscle. It is given by your care team in a hospital or clinic setting. Talk to your care team about the use of this medication in children. Special care may be needed. Overdosage: If you think you have taken too much of this medicine contact a poison control center or emergency room at once. NOTE: This medicine is only for you. Do not share this medicine with others. What if I miss a dose? Keep appointments for follow-up doses. It is important not to miss your dose. Call your care team if you are unable to keep an appointment. What may interact with this medication? Certain medications that prevent or treat blood clots, such as warfarin, enoxaparin, dalteparin, apixaban, dabigatran, rivaroxaban This list may not describe all possible interactions. Give your health care provider a list of all the medicines, herbs, non-prescription drugs, or dietary supplements you use. Also tell them if you smoke, drink alcohol, or use illegal drugs. Some items may interact with your medicine. What should I watch for while using this medication? Your condition will be monitored carefully while you are receiving this  medication. You may need blood work while taking this medication. Talk to your care team if you may be pregnant. Serious birth defects can occur if you take this medication during pregnancy and for 1 year after the last dose. You will need a negative pregnancy test before starting this medication. Contraception is recommended while taking this medication and for 1 year after the last dose. Your care team can help you find the option that works for you. Do not breastfeed while taking this medication and for 1 year after the last dose. This medication may cause infertility. Talk to your care team if you are concerned about your fertility. What side effects may I notice from receiving this medication? Side effects that you should report to your care team as soon as possible: Allergic reactions or angioedema--skin rash, itching or hives, swelling of the face, eyes, lips, tongue, arms, or legs, trouble swallowing or breathing Pain, tingling, or numbness in the hands or feet Side effects that usually do not require medical attention (report to your care team if they continue or are bothersome): Bone, joint, or muscle pain Constipation Headache Hot flashes Nausea Pain, redness, or irritation at injection site Unusual weakness or fatigue This list may not describe all possible side effects. Call your doctor for medical advice about side effects. You may report side effects to FDA at 1-800-FDA-1088. Where should I keep my medication? This medication is given in a hospital or clinic. It will not be stored at home. NOTE: This sheet is a summary. It may not cover all possible information. If you have questions about this medicine, talk to your doctor, pharmacist, or health care provider.  2024 Elsevier/Gold Standard (2021-06-03 00:00:00)  Magnesium Sulfate Injection What is this medication? MAGNESIUM SULFATE (mag NEE zee um SUL fate) prevents and treats low levels of magnesium in your body. It may also  be used to prevent and treat seizures during pregnancy in people with high blood pressure disorders, such as preeclampsia or eclampsia. Magnesium plays an important role in maintaining the health of your muscles and nervous system. This medicine may be used for other purposes; ask your health care provider or pharmacist if you have questions. What should I tell my care team before I take this medication? They need to know if you have any of these conditions: Heart disease History of irregular heart beat Kidney disease An unusual or allergic reaction to magnesium sulfate, medications, foods, dyes, or preservatives Pregnant or trying to get pregnant Breast-feeding How should I use this medication? This medication is for infusion into a vein. It is given in a hospital or clinic setting. Talk to your care team about the use of this medication in children. While this medication may be prescribed for selected conditions, precautions do apply. Overdosage: If you think you have taken too much of this medicine contact a poison control center or emergency room at once. NOTE: This medicine is only for you. Do not share this medicine with others. What if I miss a dose? This does not apply. What may interact with this medication? Certain medications for anxiety or sleep Certain medications for seizures, such phenobarbital Digoxin Medications that relax muscles for surgery Narcotic medications for pain This list may not describe all possible interactions. Give your health care provider a list of all the medicines, herbs, non-prescription drugs, or dietary supplements you use. Also tell them if you smoke, drink alcohol, or use illegal drugs. Some items may interact with your medicine. What should I watch for while using this medication? Your condition will be monitored carefully while you are receiving this medication. You may need blood work done while you are receiving this medication. What side effects  may I notice from receiving this medication? Side effects that you should report to your care team as soon as possible: Allergic reactions--skin rash, itching, hives, swelling of the face, lips, tongue, or throat High magnesium level--confusion, drowsiness, facial flushing, redness, sweating, muscle weakness, fast or irregular heartbeat, trouble breathing Low blood pressure--dizziness, feeling faint or lightheaded, blurry vision Side effects that usually do not require medical attention (report to your care team if they continue or are bothersome): Headache Nausea This list may not describe all possible side effects. Call your doctor for medical advice about side effects. You may report side effects to FDA at 1-800-FDA-1088. Where should I keep my medication? This medication is given in a hospital or clinic and will not be stored at home. NOTE: This sheet is a summary. It may not cover all possible information. If you have questions about this medicine, talk to your doctor, pharmacist, or health care provider.  2024 Elsevier/Gold Standard (2020-10-02 00:00:00)

## 2022-10-04 ENCOUNTER — Other Ambulatory Visit: Payer: Self-pay | Admitting: Adult Health

## 2022-10-06 ENCOUNTER — Encounter: Payer: Self-pay | Admitting: Oncology

## 2022-10-06 ENCOUNTER — Encounter: Payer: Self-pay | Admitting: Internal Medicine

## 2022-10-07 MED ORDER — ZOLPIDEM TARTRATE 10 MG PO TABS: 10.0000 mg | ORAL_TABLET | Freq: Every evening | ORAL | 1 refills | Status: DC | PRN

## 2022-10-15 ENCOUNTER — Encounter (INDEPENDENT_AMBULATORY_CARE_PROVIDER_SITE_OTHER): Payer: Self-pay | Admitting: Otolaryngology

## 2022-10-15 ENCOUNTER — Ambulatory Visit (INDEPENDENT_AMBULATORY_CARE_PROVIDER_SITE_OTHER): Payer: Medicare Other | Admitting: Otolaryngology

## 2022-10-15 VITALS — BP 125/67 | HR 77 | Ht 63.0 in | Wt 140.6 lb

## 2022-10-15 DIAGNOSIS — J342 Deviated nasal septum: Secondary | ICD-10-CM

## 2022-10-15 DIAGNOSIS — J329 Chronic sinusitis, unspecified: Secondary | ICD-10-CM | POA: Diagnosis not present

## 2022-10-15 DIAGNOSIS — R040 Epistaxis: Secondary | ICD-10-CM | POA: Diagnosis not present

## 2022-10-15 DIAGNOSIS — J3489 Other specified disorders of nose and nasal sinuses: Secondary | ICD-10-CM

## 2022-10-15 DIAGNOSIS — K219 Gastro-esophageal reflux disease without esophagitis: Secondary | ICD-10-CM

## 2022-10-15 DIAGNOSIS — Z7901 Long term (current) use of anticoagulants: Secondary | ICD-10-CM

## 2022-10-15 DIAGNOSIS — R0981 Nasal congestion: Secondary | ICD-10-CM | POA: Diagnosis not present

## 2022-10-15 NOTE — Progress Notes (Signed)
ENT CONSULT:  Reason for Consult: epistaxis recurrent (right > left) and nasal congestion hx of chronic sinusitis   HPI: Stacey [name] is an 82 y.o. female with hx of GERD on Prilosec, COPD, DVT on Xarelto, hx metastatic breast cancer on chemotherapy (pill and shot), on Xgeva, chemotherapy side effects including osteopenia, and necrosis of the jawbone per report, requiring extensive oral surgery interventions including multiple implants, who is here for evaluation of nasal congestion, nasal dryness and blood-tinged nasal secretions concerning for recurrent epistaxis.   She states that following chemotherapy for metastatic breast cancer treatment, she developed loose teeth as a side effect of thinning of the jaw bones and bone necrosis.  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 She started to have sinus infections and has had recurrent sinus infections for over 2 years.  Reports that she avoided antibiotics due to chronic diarrhea.  After a while left oral antral fistula healed up and she stopped having significant sinus infections. She was previously recommended saline rinses for the nose.  In terms of epistaxis history, she reports that every 5-7 days she would have recurrent blood clots mostly on the right and sometimes on the left, that she would blow out of her nostrils.  Nose bleeds have always been mild, uses a napkin to clog up her nose and it resolved after 30 min. No nasal packing in the past.  She is already on Nasacort nasal spray and Claritin.   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   #2 status post 4 cycles of doxorubicin and cyclophosphamide,                          (i) followed by radiation therapy under the care of Dr. [name].   #3 received tamoxifen for a total of seven years    RIGHT BREAST #4  S/P biopsy of the RIGHT breast upper inner quadrant on 11/14/10 showing invasive ductal carcinoma,, grade 2, estrogen receptor 85% and progesterone receptor 57% positive, Ki67 20%, HER2 not amplified.     #5 started neoadjuvant letrozole in November 2012; switched to tamoxifen as of February 2016 due to osteopenia concerns   #6  S/P right lumpectomy with sentinel lymph node biopsy on 09/03/11 for a ypT2, ypN1a, stage IIB invasive lobular carcinoma, grade 2,estrogen receptor 97% positive, progesterone receptor 12% positive, with no HER-2 amplification.   #7 status post right breast radiation therapy under the care of Dr. [name] from 10/19/2011 to 12/03/2011.    #8 did not meet criteria for genetic testing according to her insurance company.   #9 osteopenia, with a T score of -1.8 on DEXA scan at [facility] 03/07/2013             (a) status post multiple dental extractions and implants             (b) repeat bone density at [facility] 04/02/2015 shows a T score  of -2.0   METASTATIC DISEASE: April 2021 (lymph nodes, bone)   #10:  left upper extremity lymphedema led to chest CT scan 05/09/2019 showing a 5.1 cm left subpectoral chest wall mass and thoracic lymphadenopathy              (a) CT biopsy of the chest wall mass 05/25/2019 confirms recurrent breast cancer, strongly estrogen and progesterone receptor positive, HER-2 not amplified             (b) PET scan 05/30/2019 shows a left subpectoral mass measuring 5.4 cm, with significant regional and mediastinal adenopathy, sclerotic left scapular metastasis, but no liver or  lung involvement.             (c) CA 27-29 is moderately informative   #11 anastrozole started 06/02/2019; palbociclib added 06/08/2019             (a) palbociclib taken irregularly for several months secondary to cytopenias             (b) palbociclib dose decreased to 100 mg daily 21 on 7 off October 2021             (c) CT of the chest with contrast 11/09/2019 shows mild disease progression (despite a continuing drop in the CA 27-29)             (d) fulvestrant added 11/23/2019             (e) repeat chest CT with contrast 09/09/2020 read as stable.   #12 denosumab/Xgeva starting 06/08/2019, to be repeated every 3 months due to hypocalcemia             (a) treatment held after August 2022 dose secondary to ongoing dental issues   #13 genetics testing 06/15/2019 through the [facility] Common Hereditary Cancers Panel found no deleterious mutations in APC, ATM, AXIN2, BARD1, BMPR1A, BRCA1, BRCA2, BRIP1, CDH1, CDKN2A (p14ARF), CDKN2A (p16INK4a), CKD4, CHEK2, CTNNA1, DICER1, EPCAM (Deletion/duplication testing only), GREM1 (promoter region deletion/duplication testing only), KIT, MEN1, MLH1, MSH2, MSH3, MSH6, MUTYH, NBN, NF1, NHTL1, PALB2, PDGFRA, PMS2, POLD1, POLE, PTEN, RAD50, RAD51C, RAD51D, RNF43, SDHB, SDHC, SDHD, SMAD4, SMARCA4. STK11, TP53, TSC1, TSC2, and VHL.  The following genes were evaluated for sequence changes only: SDHA and HOXB13 c.251G>A variant only.   CURRENT THERAPY: Anastrozole, Fulvestrant, Palbociclib   INTERVAL HISTORY: Stacey [name] 82 y.o. female returns for follow-up of her history of metastatic breast cancer.  Her most recent restaging PET scan occurred on March 18, 2022 demonstrating no evidence of hypermetabolic metastatic disease.   She is doing moderately well.  She notes that she has had some increased nasal drainage and wants to have another referral to ear nose and throat.  She is seen different ENTs in the past however has been recommended saline nasal rinsing.   Since this drainage and discharge is worsened she thinks another referral would be helpful for her.  IM note by Dr Oliver Barre from 09/14/2022   EMMILYN REVERON is a 82 y.o. female here overall doing ok, has chronic diarrhea with ibrance, with resulting low magnesium requiring replacement;  over the years can tell sh is having gradually worsening sob doe and fatigue, but Pt denies chest pain, wheezing, orthopnea, PND, increased LE swelling, palpitations, dizziness or syncope.   Pt denies polydipsia, polyuria, or new focal neuro s/s.    Pt denies fever, night sweats, loss of appetite, or other constitutional symptoms   Wt down several lbs with low all upper teeth, awaiting  dentures.    Past Medical History:  Diagnosis Date   ANEMIA-NOS    ANXIETY    Breast cancer (HCC) 1997 L, 2012 R   s/p chemo/xrt   COPD    resolved   DIVERTICULOSIS, COLON 2008   Dizziness    Family history of breast cancer    Family history of lung cancer    Family history of lymphoma    Family history of pancreatic cancer    Family history of uterine cancer    GERD    Hx of radiation therapy 10/19/11 -12/03/11   right breast   HYPERLIPIDEMIA    IRRITABLE BOWEL SYNDROME, HX OF    Left-sided carotid artery disease (HCC)    moderate left ICA stenosis   OSTEOARTHRITIS, HAND    PSVT (paroxysmal supraventricular tachycardia)    symptomatic on event monitor    Past Surgical History:  Procedure Laterality Date   ABDOMINAL HYSTERECTOMY     APPENDECTOMY     BREAST BIOPSY  11/14/10    r breast: inv, insitu mammary carcinoma w/calcif, er/pr +, her2 -   BREAST SURGERY     lumpectomy   CATARACT EXTRACTION     both eyes   ELECTROPHYSIOLOGIC STUDY N/A 05/10/2015   Procedure: SVT Ablation;  Surgeon: Will Jorja Loa, MD;  Location: [facility];  Service: Cardiovascular;  Laterality: N/A;   HERNIA REPAIR     inguinal herniorrhapy  1984   left   IR US GUIDE BX ASP/DRAIN  05/26/2019   rectal fissure repair     s/p  benign breast biopsy  2003   right   s/p left foot surgury  2009   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   spiral fx left foot  2008   no surgury   TMJ ARTHROPLASTY  1989   TONGUE SURGERY     1988- to remove scar tissue growth    TONSILLECTOMY      Family History  Problem Relation Age of Onset   Hypertension Mother    Stroke Mother    Colon polyps Mother    Diabetes Mother    Pancreatic cancer Mother 16   Uterine cancer Mother 73   Lymphoma Brother        burkitts   Lung cancer Paternal Uncle    Lung cancer Maternal Grandmother 30       non-smoker   Lung cancer Maternal Grandfather    Breast cancer Cousin        maternal cousin, dx in her mid 66s   Brain cancer Cousin        maternal cousin's son; dx in his 70s   Testicular cancer Cousin        maternal cousin's son;    Breast cancer Cousin        paternal cousin; dx in her 10s   Breast cancer Cousin        paternal cousin's daughter; dx in 20s; neg genetic testing   Colon cancer Neg Hx    Esophageal cancer Neg Hx    Stomach cancer Neg Hx    Rectal cancer Neg Hx     Social History:  reports that she quit smoking about 6 years ago. Her smoking use included cigarettes. She started smoking about 60 years ago. She has a 54 pack-year smoking history. She has never used smokeless tobacco. She reports current alcohol use. She reports that she does not use drugs.  Allergies:  Allergies  Allergen  Reactions   Aminoglycosides Other (See Comments)    Unknown reaction - pt is not sure where this entry came from   Bacitracin Other (See Comments)   Bee Venom Swelling    Severe body swelling   Cephalexin Other (See Comments)   Clindamycin/Lincomycin Diarrhea and Nausea And Vomiting   Codeine Nausea And Vomiting    Pt can take codeine cough syrup.    Medications: I have reviewed the patient's current medications.  The PMH, PSH, Medications, Allergies, and SH were reviewed and updated.  ROS: Constitutional: Negative  for fever, weight loss and weight gain. Cardiovascular: Negative for chest pain and dyspnea on exertion. Respiratory: Is not experiencing shortness of breath at rest. Gastrointestinal: Negative for nausea and vomiting. Neurological: Negative for headaches. Psychiatric: The patient is not nervous/anxious  Blood pressure 125/67, pulse 77, height 5\' 3"  (1.6 m), weight 140 lb 9.6 oz (63.8 kg), SpO2 96%.  PHYSICAL EXAM:  Exam: General: Well-developed, frail Respiratory Respiratory effort: Equal inspiration and expiration without stridor Cardiovascular Peripheral Vascular: Warm extremities with equal color/perfusion Eyes: No nystagmus with equal extraocular motion bilaterally Neuro/Psych/Balance: Patient oriented to person, place, and time; Appropriate mood and affect; Gait is intact with no imbalance; Cranial nerves I-XII are intact Head and Face Inspection: Normocephalic and atraumatic without mass or lesion Palpation: Facial skeleton intact without bony stepoffs Salivary Glands: No mass or tenderness Facial Strength: Facial motility symmetric and full bilaterally ENT Pinna: External ear intact and fully developed External canal: Canal is patent with intact skin Tympanic Membrane: Clear and mobile External Nose: No scar or anatomic deformity Internal Nose: Septum is deviated to the left. No polyps, no active epistaxis. Mucosal erythema present.  Evidence of significant nasal mucosal dryness and burden of purulent and blood-tinged crusting along the middle turbinate middle meatus bilaterally.  Lips, Teeth, and gums: Edentulous, few dental implants in place, no evidence of persistent or antral fistula TMJ: No pain to palpation with full mobility Oral cavity/oropharynx: No erythema or exudate, no lesions present Nasopharynx: No mass or lesion with intact mucosa Neck Neck and Trachea: Midline trachea without mass or lesion Thyroid: No mass or nodularity Lymphatics: No  lymphadenopathy  Procedure:   PROCEDURE NOTE: nasal endoscopy  Preoperative diagnosis: chronic sinusitis symptoms  Postoperative diagnosis: same  Procedure: Diagnostic nasal endoscopy (16109)  Surgeon: Ashok Croon, M.D.  Anesthesia: Topical lidocaine and Afrin  H&P REVIEW: The patient's history and physical were reviewed today prior to procedure. All medications were reviewed and updated as well. Complications: None Condition is stable throughout exam Indications and consent: The patient presents with symptoms of chronic sinusitis not responding to previous therapies. All the risks, benefits, and potential complications were reviewed with the patient preoperatively and informed consent was obtained. The time out was completed with confirmation of the correct procedure.   Procedure: The patient was seated upright in the clinic. Topical lidocaine and Afrin were applied to the nasal cavity. After adequate anesthesia had occurred, the rigid nasal endoscope was passed into the nasal cavity. The nasal mucosa, turbinates, septum, and sinus drainage pathways were visualized bilaterally. This revealed no purulence or significant secretions that might be cultured. There were no polyps or sites of significant inflammation. The mucosa was intact and there was significant crusting present in middle meatus and along middle turbinate bilaterally.  There was significant dryness of nasal mucosa, but no evidence of epistaxis or significant blood clot burden in nasal passages. The scope was then slowly withdrawn and the patient tolerated the procedure  well. There were no complications or blood loss.  Studies Reviewed: PET/CT 03/17/22 IMPRESSION: 1. No evidence of hypermetabolic metastatic disease. Treated sclerotic left scapular metastasis. 2. Waxing and waning clustered peribronchovascular nodularity and nodular consolidation, seen on exams dating back to 09/09/2020, indicative of an infectious  etiology. 3. Cholelithiasis. 4. Moderate hiatal hernia. 5. Air-fluid level in the left maxillary sinus. 6. Aortic atherosclerosis (ICD10-I70.0). Coronary artery calcification. 7.  Emphysema (ICD10-J43.9).    Assessment/Plan: Encounter Diagnoses  Name Primary?   Chronic sinusitis, unspecified location Yes   Epistaxis [R04.0]    Nasal dryness [J34.89]    Nasal congestion [R09.81]    Deviated nasal septum [J53.84]    82 year old female with history of metastatic breast cancer currently on chemotherapy complicated by jawbone necrosis and osteopenia, history of chronic nasal congestion and chronic sinusitis following extensive dental work required after her breast cancer treatment, with left maxillary sinus or antral fistula, who is here for evaluation and management of nasal dryness/nasal crusting, nasal congestion, low-volume recurrent epistaxis, and to establish with me for management of chronic sinusitis.  She is on Xarelto for history of DVT.  She elects not to have any antibiotic therapy for sinus infections due to history of chronic diarrhea in the setting of chemotherapy medications.  I reviewed her most recent PET/CT performed in February 2024 and it demonstrates left maxillary sinus air-fluid level.  She is on Nasacort and Claritin.  Reports blowing out blood-tinged clots from both sides of her nose, and has low-volume epistaxis from time to time which she manages with a napkin placement in her nostril.  Epistaxis self resolves.   From my exam today including bilateral nasal endoscopy, there was no evidence of active epistaxis or obvious source of bleeding.  She had upper dentures in place which I removed and examined for any persistent oral antral fistula send there was no evidence of that on exam.  Bilateral nasal endoscopy demonstrated significant mucosal dryness and evidence of erythema throughout the nasal passages.  There was significant crusting along the middle turbinates and middle  meatus bilaterally.   No recent imaging of sinuses based on record review.   History of chronic sinusitis, potentially odontogenic source based on history, will obtain CT of the sinuses to assess -Nasal saline rinses twice daily with NeilMed bottle -Continue nasocort 2 puffs both sides of the nose twice daily, and continue Claritin 10 mg daily -No evidence of bacterial infection on exam today will hold off on any additional interventions until results of CT sinuses available for review  2.  Recurrent epistaxis, history of anticoagulation with Xarelto 2/2 DVT -Low-volume epistaxis based on history, I provided epistaxis prevention instructions to the patient outlined below Epistaxis prevention instructions given to the patient: - use nasal saline spray x6/day and Vaseline twice a day to prevent nose bleeds - for active nose bleeds use Afrin and nasal tip pressure x 10 min to stop them - if nose bleed does not stop with above measures, please go to Emergency Room  - please see your primary care provider to check your blood pressure and make sure it is under control - return for recurrent nose bleeds and we will consider cautery of your nasal blood vessels  - Purchase BleedStop to have at home and help with epistaxis control   3.  History of GERD -I discussed the impact of untreated reflux on nasal congestion, instructed the patient to continue current medication Prilosec 20 mg daily  4. Allergic rhinitis history of nasal  congestion/postnasal drainage -Continue Claritin and Nasacort as above  She will return after imaging of her sinuses for follow-up  Thank you for allowing me to participate in the care of this patient. Please do not hesitate to contact me with any questions or concerns.   Ashok Croon, MD Otolaryngology [facility] Phone: [phone] Fax: [phone]    10/17/2022, 5:25 AM

## 2022-10-15 NOTE — Patient Instructions (Signed)
Epistaxis prevention instructions given to the patient: - use nasal saline spray x6/day and Vaseline twice a day to prevent nose bleeds - for active nose bleeds use Afrin and nasal tip pressure x 10 min to stop them - if nose bleed does not stop with above measures, please go to Emergency Room  - please see your primary care provider to check your blood pressure and make sure it is under control - return for recurrent nose bleeds and we will consider cautery of your nasal blood vessels  - Purchase BleedStop to have at home and help with epistaxis control    Lloyd Huger Med Nasal Saline Rinse   - start nasal saline rinses with NeilMed Bottle available over the counter or online to help with nasal congestion

## 2022-10-23 ENCOUNTER — Inpatient Hospital Stay: Payer: Medicare Other | Admitting: Hematology and Oncology

## 2022-10-23 ENCOUNTER — Inpatient Hospital Stay: Payer: Medicare Other

## 2022-10-23 ENCOUNTER — Inpatient Hospital Stay: Payer: Medicare Other | Attending: Adult Health

## 2022-10-23 ENCOUNTER — Inpatient Hospital Stay (HOSPITAL_BASED_OUTPATIENT_CLINIC_OR_DEPARTMENT_OTHER): Payer: Medicare Other | Admitting: Adult Health

## 2022-10-23 VITALS — BP 120/55 | HR 98 | Temp 97.9°F | Resp 18 | Wt 141.2 lb

## 2022-10-23 DIAGNOSIS — I890 Lymphedema, not elsewhere classified: Secondary | ICD-10-CM | POA: Diagnosis not present

## 2022-10-23 DIAGNOSIS — C50912 Malignant neoplasm of unspecified site of left female breast: Secondary | ICD-10-CM | POA: Diagnosis not present

## 2022-10-23 DIAGNOSIS — Z8042 Family history of malignant neoplasm of prostate: Secondary | ICD-10-CM | POA: Diagnosis not present

## 2022-10-23 DIAGNOSIS — C7951 Secondary malignant neoplasm of bone: Secondary | ICD-10-CM | POA: Diagnosis not present

## 2022-10-23 DIAGNOSIS — Z79818 Long term (current) use of other agents affecting estrogen receptors and estrogen levels: Secondary | ICD-10-CM | POA: Insufficient documentation

## 2022-10-23 DIAGNOSIS — Z170 Estrogen receptor positive status [ER+]: Secondary | ICD-10-CM

## 2022-10-23 DIAGNOSIS — C50212 Malignant neoplasm of upper-inner quadrant of left female breast: Secondary | ICD-10-CM | POA: Diagnosis not present

## 2022-10-23 DIAGNOSIS — Z5111 Encounter for antineoplastic chemotherapy: Secondary | ICD-10-CM | POA: Diagnosis present

## 2022-10-23 DIAGNOSIS — K219 Gastro-esophageal reflux disease without esophagitis: Secondary | ICD-10-CM | POA: Insufficient documentation

## 2022-10-23 DIAGNOSIS — Z923 Personal history of irradiation: Secondary | ICD-10-CM | POA: Diagnosis not present

## 2022-10-23 DIAGNOSIS — Z86718 Personal history of other venous thrombosis and embolism: Secondary | ICD-10-CM | POA: Insufficient documentation

## 2022-10-23 DIAGNOSIS — Z7901 Long term (current) use of anticoagulants: Secondary | ICD-10-CM | POA: Insufficient documentation

## 2022-10-23 DIAGNOSIS — K580 Irritable bowel syndrome with diarrhea: Secondary | ICD-10-CM | POA: Diagnosis not present

## 2022-10-23 DIAGNOSIS — E785 Hyperlipidemia, unspecified: Secondary | ICD-10-CM | POA: Insufficient documentation

## 2022-10-23 DIAGNOSIS — Z803 Family history of malignant neoplasm of breast: Secondary | ICD-10-CM | POA: Insufficient documentation

## 2022-10-23 DIAGNOSIS — Z87891 Personal history of nicotine dependence: Secondary | ICD-10-CM | POA: Diagnosis not present

## 2022-10-23 DIAGNOSIS — C50211 Malignant neoplasm of upper-inner quadrant of right female breast: Secondary | ICD-10-CM | POA: Insufficient documentation

## 2022-10-23 DIAGNOSIS — J449 Chronic obstructive pulmonary disease, unspecified: Secondary | ICD-10-CM | POA: Diagnosis not present

## 2022-10-23 DIAGNOSIS — R222 Localized swelling, mass and lump, trunk: Secondary | ICD-10-CM | POA: Diagnosis not present

## 2022-10-23 DIAGNOSIS — D630 Anemia in neoplastic disease: Secondary | ICD-10-CM

## 2022-10-23 DIAGNOSIS — M8580 Other specified disorders of bone density and structure, unspecified site: Secondary | ICD-10-CM | POA: Diagnosis not present

## 2022-10-23 LAB — CBC WITH DIFFERENTIAL (CANCER CENTER ONLY)
Abs Immature Granulocytes: 0.02 10*3/uL (ref 0.00–0.07)
Basophils Absolute: 0 10*3/uL (ref 0.0–0.1)
Basophils Relative: 0 %
Eosinophils Absolute: 0 10*3/uL (ref 0.0–0.5)
Eosinophils Relative: 1 %
HCT: 27 % — ABNORMAL LOW (ref 36.0–46.0)
Hemoglobin: 9.2 g/dL — ABNORMAL LOW (ref 12.0–15.0)
Immature Granulocytes: 1 %
Lymphocytes Relative: 15 %
Lymphs Abs: 0.4 10*3/uL — ABNORMAL LOW (ref 0.7–4.0)
MCH: 36.5 pg — ABNORMAL HIGH (ref 26.0–34.0)
MCHC: 34.1 g/dL (ref 30.0–36.0)
MCV: 107.1 fL — ABNORMAL HIGH (ref 80.0–100.0)
Monocytes Absolute: 0.4 10*3/uL (ref 0.1–1.0)
Monocytes Relative: 14 %
Neutro Abs: 1.9 10*3/uL (ref 1.7–7.7)
Neutrophils Relative %: 69 %
Platelet Count: 175 10*3/uL (ref 150–400)
RBC: 2.52 MIL/uL — ABNORMAL LOW (ref 3.87–5.11)
RDW: 15.2 % (ref 11.5–15.5)
WBC Count: 2.7 10*3/uL — ABNORMAL LOW (ref 4.0–10.5)
nRBC: 0 % (ref 0.0–0.2)

## 2022-10-23 LAB — CMP (CANCER CENTER ONLY)
ALT: 11 U/L (ref 0–44)
AST: 18 U/L (ref 15–41)
Albumin: 4.1 g/dL (ref 3.5–5.0)
Alkaline Phosphatase: 60 U/L (ref 38–126)
Anion gap: 13 (ref 5–15)
BUN: 21 mg/dL (ref 8–23)
CO2: 17 mmol/L — ABNORMAL LOW (ref 22–32)
Calcium: 9 mg/dL (ref 8.9–10.3)
Chloride: 109 mmol/L (ref 98–111)
Creatinine: 1.31 mg/dL — ABNORMAL HIGH (ref 0.44–1.00)
GFR, Estimated: 41 mL/min — ABNORMAL LOW (ref >60)
Glucose, Bld: 118 mg/dL — ABNORMAL HIGH (ref 70–99)
Potassium: 3.9 mmol/L (ref 3.5–5.1)
Sodium: 139 mmol/L (ref 135–145)
Total Bilirubin: 0.4 mg/dL (ref 0.3–1.2)
Total Protein: 7.7 g/dL (ref 6.5–8.1)

## 2022-10-23 LAB — MAGNESIUM: Magnesium: 0.8 mg/dL — CL (ref 1.7–2.4)

## 2022-10-23 MED ORDER — MAGNESIUM SULFATE 2 GM/50ML IV SOLN
2.0000 g | Freq: Once | INTRAVENOUS | Status: AC
  Administered 2022-10-23: 2 g via INTRAVENOUS
  Filled 2022-10-23: qty 50

## 2022-10-23 MED ORDER — DEXAMETHASONE 4 MG PO TABS
2.0000 mg | ORAL_TABLET | Freq: Every day | ORAL | 0 refills | Status: DC
Start: 2022-10-23 — End: 2022-12-16

## 2022-10-23 MED ORDER — SODIUM CHLORIDE 0.9 % IV SOLN: Freq: Once | INTRAVENOUS | Status: AC

## 2022-10-23 MED ORDER — FULVESTRANT 250 MG/5ML IM SOSY
500.0000 mg | PREFILLED_SYRINGE | Freq: Once | INTRAMUSCULAR | Status: AC
  Administered 2022-10-23: 500 mg via INTRAMUSCULAR
  Filled 2022-10-23: qty 10

## 2022-10-23 NOTE — Progress Notes (Signed)
CRITICAL VALUE STICKER    CRITICAL VALUE:  Magnesium 0.8 RECEIVER (on-site recipient of call): Alessandra Bevels LPN  DATE & TIME NOTIFIED: 10/23/22 2:30  MESSENGER (representative from lab):  MD NOTIFIED: Lillard Anes NP   TIME OF NOTIFICATION:240  RESPONSE:  Provider notified

## 2022-10-23 NOTE — Assessment & Plan Note (Addendum)
Stacey [name] is an 82 year old woman with metastatic breast cancer currently on treatment for with fulvestrant, palbociclib, and anastrozole.  She we will continue with fulvestrant, Ibrance, and anastrozole.  Orders for restaging PET CT scan to occur in the next couple of weeks.  Hypomagesemia: Her magnesium level today is 0.8.  She will receive 2 g of IV magnesium today and 2 g of IV magnesium on Monday.  Chronic shortness of breath: I recommended that if she does not start feeling better after the IV magnesium that she consider going to the emergency room if her shortness of breath worsens.  Energy: I gave her dexamethasone 2 mg p.o. daily for her to take.  This may also help with the pain in her right leg.  I let her know the risks of taking the dexamethasone at a very low dose with a blood thinner.  She knows to take with food and continue on her proton pump inhibitor.  Should she develop any stomach pain or worsening reflux she knows to stop the dexamethasone and let us know immediately.  After reviewing risks and benefits she really wants to proceed with the dexamethasone.  When she returns on Monday for her IV magnesium she will have the nurses call and have me come over to look at her leg.    RTC on Monday and again in 4 weeks for labs, f/u, IV magnesium, and faslodex.

## 2022-10-23 NOTE — Progress Notes (Signed)
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   #2 status post 4 cycles of doxorubicin and cyclophosphamide,                          (i) followed by radiation therapy under the care of Dr. [name].   #3 received tamoxifen for a total of seven years    RIGHT BREAST #4  S/P biopsy of the RIGHT breast upper inner quadrant on 11/14/10 showing invasive ductal carcinoma,, grade 2, estrogen receptor 85% and progesterone receptor 57% positive, Ki67 20%, HER2 not amplified.     #5 started neoadjuvant letrozole in November 2012; switched to tamoxifen as of February 2016 due to osteopenia concerns   #6  S/P right lumpectomy with sentinel lymph node biopsy on 09/03/11 for a ypT2, ypN1a, stage IIB invasive lobular carcinoma, grade 2,estrogen receptor 97% positive, progesterone receptor 12% positive, with no HER-2 amplification.   #7 status post right breast radiation therapy under the care of Dr. [name] from 10/19/2011 to 12/03/2011.    #8 did not meet criteria for genetic testing according to her insurance company.   #9 osteopenia, with a T score of -1.8 on DEXA scan at [facility] 03/07/2013             (a) status post multiple dental extractions and implants             (b) repeat bone density at [facility] 04/02/2015 shows a T score of -2.0   METASTATIC DISEASE: April 2021 (lymph nodes, bone)   #10:  left upper extremity lymphedema led to chest CT scan 05/09/2019 showing a 5.1 cm left subpectoral chest wall mass and thoracic lymphadenopathy              (a) CT biopsy of the chest wall mass 05/25/2019 confirms recurrent breast cancer, strongly estrogen and  progesterone receptor positive, HER-2 not amplified             (b) PET scan 05/30/2019 shows a left subpectoral mass measuring 5.4 cm, with significant regional and mediastinal adenopathy, sclerotic left scapular metastasis, but no liver or lung involvement.             (c) CA 27-29 is moderately informative   #11 anastrozole started 06/02/2019; palbociclib added 06/08/2019             (a) palbociclib taken irregularly for several months secondary to cytopenias             (b) palbociclib dose decreased to 100 mg daily 21 on 7 off October 2021             (c) CT of the chest with contrast 11/09/2019 shows mild disease progression (despite a continuing drop in the CA 27-29)             (d) fulvestrant added 11/23/2019             (e) repeat chest CT with contrast 09/09/2020 read as stable.   #12 denosumab/Xgeva starting 06/08/2019, to be repeated every 3 months due to hypocalcemia             (  a) treatment held after August 2022 dose secondary to ongoing dental issues   #13 genetics testing 06/15/2019 through the [facility] Common Hereditary Cancers Panel found no deleterious mutations in APC, ATM, AXIN2, BARD1, BMPR1A, BRCA1, BRCA2, BRIP1, CDH1, CDKN2A (p14ARF), CDKN2A (p16INK4a), CKD4, CHEK2, CTNNA1, DICER1, EPCAM (Deletion/duplication testing only), GREM1 (promoter region deletion/duplication testing only), KIT, MEN1, MLH1, MSH2, MSH3, MSH6, MUTYH, NBN, NF1, NHTL1, PALB2, PDGFRA, PMS2, POLD1, POLE, PTEN, RAD50, RAD51C, RAD51D, RNF43, SDHB, SDHC, SDHD, SMAD4, SMARCA4. STK11, TP53, TSC1, TSC2, and VHL.  The following genes were evaluated for sequence changes only: SDHA and HOXB13 c.251G>A variant only.  CURRENT THERAPY: Anastrozole Fulvestrant Palbociclib  INTERVAL HISTORY: Stacey [name] 82 y.o. female returns for follow-up on fulvestrant, Ibrance, and anastrozole.  She is tolerating her treatment quite well and denies any significant issues with taking it.    Her most recent restaging occurred  with PET scan on 2/13/52024 demonstrating no evidence of hypermetabolic metastatic disease.  She is due for restaging scans.    Notes her right lower extremity is slightly more painful posteriorly towards her ankle.  This is new.  She continues on Xarelto 20 mg daily.  She was previously on a dexamethasone taper and noted that she had more energy and breathe better.  She wants to know if this is something she could take on a regular basis.  She had more diarrhea this past week than usual.  Her magnesium level today is 0.8.   Patient Active Problem List   Diagnosis Date Noted   Acute deep vein thrombosis (DVT) of femoral vein of right lower extremity (HCC) 04/13/2022   Acute upper respiratory infection 05/16/2021   Nasal dryness 02/04/2021   Chronic sinusitis 11/25/2020   Allergic rhinitis 07/08/2020   Hypomagnesemia 06/10/2020   Goals of care, counseling/discussion 06/21/2019   Family history of breast cancer    Family history of pancreatic cancer    Family history of uterine cancer    Family history of lymphoma    Family history of lung cancer    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HCC) 06/02/2019   Recurrent cancer of left breast (HCC) 05/15/2019   Pes anserine bursitis 03/31/2019   HTN (hypertension) 02/10/2019   Hyperglycemia 06/21/2018   Left carpal tunnel syndrome 02/22/2018   Rotator cuff arthropathy of left shoulder 09/20/2017   Arthritis of hand 08/23/2017   Pain in right hand 08/23/2017   Cervical radiculitis 04/09/2017   Left shoulder pain 04/09/2017   Dyspnea on exertion 05/14/2016   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   Lymphedema of left arm 01/14/2016   Left arm swelling 12/25/2015   SVT (supraventricular tachycardia)    Left-sided carotid artery disease (HCC) 03/08/2015   Dizziness 01/24/2015   Urinary frequency 01/13/2015   Dizziness and giddiness 01/09/2015   Gallstones 02/21/2013   Malignant neoplasm of upper-inner quadrant  of left breast in female, estrogen receptor positive (HCC) 11/28/2012   Muscle cramping 09/12/2012   Right hip pain 09/12/2012   Lower back pain 09/12/2012   COPD GOLD I     Hx of radiation therapy    Right shoulder pain 09/14/2011   Preventative health care 07/29/2010   Skin lesion of left leg 07/29/2010   Paresthesia 07/29/2010   GERD 03/28/2010   CONSTIPATION 03/28/2010   Osteoarthrosis, 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   Anxiety state 05/03/2009   Hyperlipidemia 06/14/2007   FATIGUE 06/14/2007   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  MEDICAL HISTORY: Past Medical History:  Diagnosis Date   ANEMIA-NOS    ANXIETY    Breast cancer (HCC) 1997 L, 2012 R   s/p chemo/xrt   COPD    resolved   DIVERTICULOSIS, COLON 2008   Dizziness    Family history of breast cancer    Family history of lung cancer    Family history of lymphoma    Family history of pancreatic cancer    Family history of uterine cancer    GERD    Hx of radiation therapy 10/19/11 -12/03/11   right breast   HYPERLIPIDEMIA    IRRITABLE BOWEL SYNDROME, HX OF    Left-sided carotid artery disease (HCC)    moderate left ICA stenosis   OSTEOARTHRITIS, HAND    PSVT (paroxysmal supraventricular tachycardia)    symptomatic on event monitor    SURGICAL HISTORY: Past Surgical History:  Procedure Laterality Date   ABDOMINAL HYSTERECTOMY     APPENDECTOMY     BREAST BIOPSY  11/14/10    r breast: inv, insitu mammary carcinoma w/calcif, er/pr +, her2 -   BREAST SURGERY     lumpectomy   CATARACT EXTRACTION     both eyes   ELECTROPHYSIOLOGIC STUDY N/A 05/10/2015   Procedure: SVT Ablation;  Surgeon: Will Jorja Loa, MD;  Location: [facility] INVASIVE CV LAB;  Service: Cardiovascular;  Laterality: N/A;   HERNIA REPAIR     inguinal  herniorrhapy  1984   left   IR US GUIDE BX ASP/DRAIN  05/26/2019   rectal fissure repair     s/p benign breast biopsy  2003   right   s/p left foot surgury  2009   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   spiral fx left foot  2008   no surgury   TMJ ARTHROPLASTY  1989   TONGUE SURGERY     1988- to remove scar tissue growth    TONSILLECTOMY      SOCIAL HISTORY: Social History   Socioeconomic History   Marital status: Divorced    Spouse name: 2 Step-children   Number of children: 2   Years of education: Not on file   Highest education level: Bachelor's degree (e.g., BA, AB, BS)  Occupational History   Occupation: retired Training and development officer  Tobacco Use   Smoking status: Former    Current packs/day: 0.00    Average packs/day: 1 pack/day for 54.0 years (54.0 ttl pk-yrs)    Types: Cigarettes    Start date: 01/02/1962    Quit date: 01/03/2016    Years since quitting: 6.8   Smokeless tobacco: Never  Vaping Use   Vaping status: Never Used  Substance and Sexual Activity   Alcohol use: Yes    Comment: rare/ drinks socially   Drug use: No   Sexual activity: Never  Other Topics Concern   Not on file  Social History Narrative   Patient gets no regular exercise   No biological children   2 step children   Social Determinants of Health   Financial Resource Strain: Low Risk  (09/10/2022)   Overall Financial Resource Strain (CARDIA)    Difficulty of Paying Living Expenses: Not hard at all  Food Insecurity: No Food Insecurity (09/10/2022)   Hunger Vital Sign    Worried About Running Out of Food in the Last Year: Never true  Ran Out of Food in the Last Year: Never true  Transportation Needs: No Transportation Needs (09/10/2022)   PRAPARE - Administrator, Civil Service (Medical): No    Lack of Transportation (Non-Medical): No  Physical Activity: Inactive (09/10/2022)   Exercise Vital Sign    Days of Exercise per Week: 0 days    Minutes of Exercise per Session: 30 min   Stress: No Stress Concern Present (09/10/2022)   [employer] of Occupational Health - Occupational Stress Questionnaire    Feeling of Stress : Not at all  Social Connections: Socially Isolated (09/10/2022)   Social Connection and Isolation Panel [NHANES]    Frequency of Communication with Friends and Family: More than three times a week    Frequency of Social Gatherings with Friends and Family: Once a week    Attends Religious Services: Never    Database administrator or Organizations: No    Attends Banker Meetings: Never    Marital Status: Divorced  Catering manager Violence: Not At Risk (12/02/2021)   Humiliation, Afraid, Rape, and Kick questionnaire    Fear of Current or Ex-Partner: No    Emotionally Abused: No    Physically Abused: No    Sexually Abused: No    FAMILY HISTORY: Family History  Problem Relation Age of Onset   Hypertension Mother    Stroke Mother    Colon polyps Mother    Diabetes Mother    Pancreatic cancer Mother 10   Uterine cancer Mother 45   Lymphoma Brother        burkitts   Lung cancer Paternal Uncle    Lung cancer Maternal Grandmother 45       non-smoker   Lung cancer Maternal Grandfather    Breast cancer Cousin        maternal cousin, dx in her mid 16s   Brain cancer Cousin        maternal cousin's son; dx in his 65s   Testicular cancer Cousin        maternal cousin's son;    Breast cancer Cousin        paternal cousin; dx in her 60s   Breast cancer Cousin        paternal cousin's daughter; dx in 24s; neg genetic testing   Colon cancer Neg Hx    Esophageal cancer Neg Hx    Stomach cancer Neg Hx    Rectal cancer Neg Hx     Review of Systems  Constitutional:  Positive for fatigue. Negative for appetite change, chills, fever and unexpected weight change.  HENT:   Negative for hearing loss, lump/mass and trouble swallowing.   Eyes:  Negative for eye problems and icterus.  Respiratory:  Positive for shortness of breath  (Chronic and at baseline.). Negative for chest tightness and cough.   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 Negative for chest pain and palpitations.  Gastrointestinal:  Negative for abdominal distention, abdominal pain, constipation, diarrhea, nausea and vomiting.  Endocrine: Negative for hot flashes.  Genitourinary:  Negative for difficulty urinating.   Musculoskeletal:  Negative for arthralgias.  Skin:  Negative for itching and rash.  Neurological:  Negative for dizziness, extremity weakness, headaches and numbness.  Hematological:  Negative for adenopathy. Does not bruise/bleed easily.  Psychiatric/Behavioral:  Negative for depression. The patient is not nervous/anxious.       PHYSICAL EXAMINATION   Onc Performance Status - 10/23/22 1423  ECOG Perf Status   ECOG Perf Status Restricted in physically strenuous activity but ambulatory and able to carry out work of a light or sedentary nature, e.g., light house work, office work      KPS SCALE   KPS % SCORE Able to carry on normal activity, minor s/s of disease             Vitals:   10/23/22 1420  BP: (!) 120/55  Pulse: 98  Resp: 18  Temp: 97.9 F (36.6 C)  SpO2: 96%    Physical Exam Constitutional:      General: She is not in acute distress.    Appearance: Normal appearance. She is not toxic-appearing.  HENT:     Head: Normocephalic and atraumatic.     Mouth/Throat:     Mouth: Mucous membranes are moist.     Pharynx: Oropharynx is clear. No oropharyngeal exudate or posterior oropharyngeal erythema.  Eyes:     General: No scleral icterus. Cardiovascular:     Rate and Rhythm: Normal rate and regular rhythm.     Pulses: Normal pulses.     Heart sounds: Normal heart sounds.  Pulmonary:     Effort: Pulmonary effort is normal.     Breath sounds: Normal breath sounds.  Abdominal:     General: Abdomen is flat. Bowel sounds are normal. There is no  distension.     Palpations: Abdomen is soft.     Tenderness: There is no abdominal tenderness.  Musculoskeletal:        General: No swelling.     Cervical back: Neck supple.  Lymphadenopathy:     Cervical: No cervical adenopathy.  Skin:    General: Skin is warm and dry.     Findings: No rash.  Neurological:     General: No focal deficit present.     Mental Status: She is alert.  Psychiatric:        Mood and Affect: Mood normal.        Behavior: Behavior normal.     LABORATORY DATA:  CBC    Component Value Date/Time   WBC 2.7 (L) 10/23/2022 1358   WBC 2.4 (L) 09/25/2022 1423   RBC 2.52 (L) 10/23/2022 1358   HGB 9.2 (L) 10/23/2022 1358   HGB 12.4 12/09/2015 1408   HCT 27.0 (L) 10/23/2022 1358   HCT 25.8 (L) 07/03/2022 1345   HCT 36.2 12/09/2015 1408   PLT 175 10/23/2022 1358   PLT 238 12/09/2015 1408   MCV 107.1 (H) 10/23/2022 1358   MCV 89.8 12/09/2015 1408   MCH 36.5 (H) 10/23/2022 1358   MCHC 34.1 10/23/2022 1358   RDW 15.2 10/23/2022 1358   RDW 13.2 12/09/2015 1408   LYMPHSABS 0.4 (L) 10/23/2022 1358   LYMPHSABS 2.1 12/09/2015 1408   MONOABS 0.4 10/23/2022 1358   MONOABS 0.5 12/09/2015 1408   EOSABS 0.0 10/23/2022 1358   EOSABS 0.2 12/09/2015 1408   BASOSABS 0.0 10/23/2022 1358   BASOSABS 0.0 12/09/2015 1408    CMP     Component Value Date/Time   NA 139 10/23/2022 1358   NA 140 12/09/2015 1409   K 3.9 10/23/2022 1358   K 4.2 12/09/2015 1409   CL 109 10/23/2022 1358   CL 107 02/02/2012 0939   CO2 17 (L) 10/23/2022 1358   CO2 25 12/09/2015 1409   GLUCOSE 118 (H) 10/23/2022 1358   GLUCOSE 102 12/09/2015 1409   GLUCOSE 104 (H) 02/02/2012 0939   BUN 21 10/23/2022 1358  BUN 16.7 12/09/2015 1409   CREATININE 1.31 (H) 10/23/2022 1358   CREATININE 0.8 12/09/2015 1409   CALCIUM 9.0 10/23/2022 1358   CALCIUM 9.4 12/09/2015 1409   PROT 7.7 10/23/2022 1358   PROT 7.0 12/09/2015 1409   ALBUMIN 4.1 10/23/2022 1358   ALBUMIN 3.3 (L) 12/09/2015 1409   AST  18 10/23/2022 1358   AST 20 12/09/2015 1409   ALT 11 10/23/2022 1358   ALT 13 12/09/2015 1409   ALKPHOS 60 10/23/2022 1358   ALKPHOS 54 12/09/2015 1409   BILITOT 0.4 10/23/2022 1358   BILITOT <0.22 12/09/2015 1409   GFRNONAA 41 (L) 10/23/2022 1358   GFRAA >60 10/26/2019 1347   GFRAA >60 05/09/2019 1303       ASSESSMENT and THERAPY PLAN:   Malignant neoplasm metastatic to bone [facility]) Stacey [name] is an 82 year old woman with metastatic breast cancer currently on treatment for with fulvestrant, palbociclib, and anastrozole.  She we will continue with fulvestrant, Ibrance, and anastrozole.  Orders for restaging PET CT scan to occur in the next couple of weeks.  Hypomagesemia: Her magnesium level today is 0.8.  She will receive 2 g of IV magnesium today and 2 g of IV magnesium on Monday.  Chronic shortness of breath: I recommended that if she does not start feeling better after the IV magnesium that she consider going to the emergency room if her shortness of breath worsens.  Energy: I gave her dexamethasone 2 mg p.o. daily for her to take.  This may also help with the pain in her right leg.  I let her know the risks of taking the dexamethasone at a very low dose with a blood thinner.  She knows to take with food and continue on her proton pump inhibitor.  Should she develop any stomach pain or worsening reflux she knows to stop the dexamethasone and let us know immediately.  After reviewing risks and benefits she really wants to proceed with the dexamethasone.  When she returns on Monday for her IV magnesium she will have the nurses call and have me come over to look at her leg.    RTC on Monday and again in 4 weeks for labs, f/u, IV magnesium, and faslodex.     All questions were answered. The patient knows to call the clinic with any problems, questions or concerns. We can certainly see the patient much sooner if necessary.  Total encounter time:55 minutes*in face-to-face visit time, chart  review, lab review, care coordination, order entry, and documentation of the encounter time.    Lillard Anes, NP 10/23/22 3:20 PM Medical Oncology and Hematology [facility] [address] [place], [place] [address] Tel. [phone]    Fax. [phone]  *Total Encounter Time as defined by the Centers for Medicare and Medicaid Services includes, in addition to the face-to-face time of a patient visit (documented in the note above) non-face-to-face time: obtaining and reviewing outside history, ordering and reviewing medications, tests or procedures, care coordination (communications with other health care professionals or caregivers) and documentation in the medical record.

## 2022-10-23 NOTE — Patient Instructions (Signed)

## 2022-10-26 ENCOUNTER — Inpatient Hospital Stay: Payer: Medicare Other

## 2022-10-28 ENCOUNTER — Encounter: Payer: Self-pay | Admitting: Adult Health

## 2022-10-29 ENCOUNTER — Other Ambulatory Visit: Payer: Self-pay | Admitting: *Deleted

## 2022-10-30 ENCOUNTER — Telehealth: Payer: Self-pay | Admitting: Hematology and Oncology

## 2022-11-10 ENCOUNTER — Ambulatory Visit (INDEPENDENT_AMBULATORY_CARE_PROVIDER_SITE_OTHER): Payer: Medicare Other

## 2022-11-10 ENCOUNTER — Telehealth: Payer: Self-pay

## 2022-11-10 VITALS — Ht 63.0 in | Wt 141.0 lb

## 2022-11-10 DIAGNOSIS — R531 Weakness: Secondary | ICD-10-CM

## 2022-11-10 DIAGNOSIS — R269 Unspecified abnormalities of gait and mobility: Secondary | ICD-10-CM

## 2022-11-10 DIAGNOSIS — Z0000 Encounter for general adult medical examination without abnormal findings: Secondary | ICD-10-CM | POA: Diagnosis not present

## 2022-11-10 NOTE — Telephone Encounter (Signed)
Patient reported asthenia after she has bowel movements.  Due to medication patient has had diarrhea consistently.  She is inquiring about some strengthening training.  Patient wants to know how to go about getting help to see a PT for her increase her strength.  I informed patient that I would send her PCP a message in regards to a referral.  She would like Dr. Jonny Ruiz or nurse to message her once he can do it or not.

## 2022-11-10 NOTE — Progress Notes (Signed)
Subjective:   Stacey [name] is a 82 y.o. female who presents for Medicare Annual (Subsequent) preventive examination.  Visit Complete: Virtual I connected with  Stacey [name] on 11/10/22 by a audio enabled telemedicine application and verified that I am speaking with the correct person using two identifiers.  Patient Location: Home  Provider Location: Home Office  I discussed the limitations of evaluation and management by telemedicine. The patient expressed understanding and agreed to proceed.  Vital Signs: Because this visit was a virtual/telehealth visit, some criteria may be missing or patient reported. Any vitals not documented were not able to be obtained and vitals that have been documented are patient reported.  Patient Medicare AWV questionnaire was completed by the patient on 11/07/2022; I have confirmed that all information answered by patient is correct and no changes since this date.  Because this visit was a virtual/telehealth visit, some criteria may be missing or patient reported. Any vitals not documented were not able to be obtained and vitals that have been documented are patient reported.   Cardiac Risk Factors include: advanced age (>14men, >38 women);hypertension;dyslipidemia;Other (see comment), Risk factor comments: COPD, GOLD, Recurrent cancer of lt breast (2021).     Objective:    Today's Vitals   11/10/22 1418  Weight: 141 lb (64 kg)  Height: 5\' 3"  (1.6 m)   Body mass index is 24.98 kg/m.     11/10/2022    2:31 PM 08/28/2022    2:02 PM 04/10/2022    2:36 PM 12/02/2021    3:11 PM 11/24/2021    2:10 PM 08/28/2021    3:54 PM 09/05/2020    2:00 PM  Advanced Directives  Does Patient Have a Medical Advance Directive? Yes Yes Yes Yes Yes Yes Yes  Type of Estate agent of [place];Living will Healthcare Power of [place];Living will Healthcare Power of [place];Living will Healthcare Power of [place];Living will Healthcare Power of  [place];Living will  Healthcare Power of [place];Living will  Does patient want to make changes to medical advance directive?   No - Patient declined No - Patient declined No - Patient declined No - Patient declined No - Patient declined  Copy of Healthcare Power of Attorney in Chart? No - copy requested   No - copy requested No - copy requested      Current Medications (verified) Outpatient Encounter Medications as of 11/10/2022  Medication Sig   acetaminophen (TYLENOL) 500 MG tablet Take 1,000 mg by mouth See admin instructions. Take 2 tablets (1000 mg) by mouth daily at bedtime, may also take 2 tablets (1000 mg) two more times during the day or night as needed for pain   anastrozole (ARIMIDEX) 1 MG tablet TAKE 1 TABLET(1 MG) BY MOUTH DAILY   aspirin 81 MG EC tablet Take 81 mg by mouth every morning.   CALCIUM CARBONATE-VITAMIN D PO Take 1 tablet by mouth 2 (two) times daily.   Cholecalciferol (VITAMIN D3 PO) Take 1 capsule by mouth every morning.   cholestyramine (QUESTRAN) 4 g packet Take 1 packet (4 g total) by mouth 3 (three) times daily with meals.   clotrimazole-betamethasone (LOTRISONE) cream Apply 1 application topically 2 (two) times daily as needed. (Patient taking differently: Apply 1 application  topically 2 (two) times daily as needed (rash/yeast in corners of mouth).)   dexamethasone (DECADRON) 4 MG tablet Take 0.5 tablets (2 mg total) by mouth daily.   diphenhydrAMINE (BENADRYL) 25 MG tablet Take 25 mg by mouth every 6 (six) hours  as needed (for bee stings).    fulvestrant (FASLODEX) 250 MG/5ML injection Inject 250 mg into the muscle every 28 (twenty-eight) days.   gabapentin (NEURONTIN) 100 MG capsule TAKE 1 CAPSULE(100 MG) BY MOUTH AT BEDTIME   loperamide (IMODIUM) 2 MG capsule 2-4 mg See admin instructions. Take 2 tablets (4 mg) by mouth for first loose stool, then 1 tablet (2 mg) for every loose stool thereafter. Do not exceed 8 tablets (16 mg) in a 24 hour period. Stop  loperamide if 12 hours have passed without a loose stool.   loratadine (CLARITIN) 10 MG tablet Take 10 mg by mouth every morning.   Magnesium 500 MG CAPS Take 500 mg by mouth every morning. Take with a 250 mg capsule for a total dose of 750 mg daily   MAGNESIUM PO Take 250 mg by mouth every morning. Take with a 500 mg capsule for a total dose of 750 mg daily   Multiple Vitamin (MULTIVITAMIN WITH MINERALS) TABS tablet Take 1 tablet by mouth every morning. Centrum   omeprazole (PRILOSEC) 20 MG capsule Take 1 capsule by mouth daily   palbociclib (IBRANCE) 100 MG tablet Take 1 tablet (100 mg total) by mouth Monday through Friday. Take as directed by MD.   prochlorperazine (COMPAZINE) 10 MG tablet Take 1 tablet (10 mg total) by mouth every 6 (six) hours as needed for nausea or vomiting.   rivaroxaban (XARELTO) 20 MG TABS tablet Take 1 tablet (20 mg total) by mouth daily with supper.   telmisartan (MICARDIS) 40 MG tablet Take 1 tablet (40 mg total) by mouth every morning.   triamcinolone (NASACORT) 55 MCG/ACT AERO nasal inhaler Place 2 sprays into the nose daily. 2 sprays each nostril at night before bedtime (Patient taking differently: Place 2 sprays into the nose See admin instructions. Instill 2 sprays into each nostril every night before bedtime)   triamterene-hydrochlorothiazide (DYAZIDE) 37.5-25 MG capsule Take 1 each (1 capsule total) by mouth every morning.   vitamin B-12 (CYANOCOBALAMIN) 100 MCG tablet Take 100 mcg by mouth daily.   zolpidem (AMBIEN) 10 MG tablet Take 1 tablet (10 mg total) by mouth at bedtime as needed for sleep.   No facility-administered encounter medications on file as of 11/10/2022.    Allergies (verified) Aminoglycosides, Bacitracin, Bee venom, Cephalexin, Clindamycin/lincomycin, and Codeine   History: Past Medical History:  Diagnosis Date   ANEMIA-NOS    ANXIETY    Breast cancer (HCC) 1997 L, 2012 R   s/p chemo/xrt   COPD    resolved   DIVERTICULOSIS, COLON  2008   Dizziness    Family history of breast cancer    Family history of lung cancer    Family history of lymphoma    Family history of pancreatic cancer    Family history of uterine cancer    GERD    Hx of radiation therapy 10/19/11 -12/03/11   right breast   HYPERLIPIDEMIA    IRRITABLE BOWEL SYNDROME, HX OF    Left-sided carotid artery disease (HCC)    moderate left ICA stenosis   OSTEOARTHRITIS, HAND    PSVT (paroxysmal supraventricular tachycardia) (HCC)    symptomatic on event monitor   Past Surgical History:  Procedure Laterality Date   ABDOMINAL HYSTERECTOMY     APPENDECTOMY     BREAST BIOPSY  11/14/10    r breast: inv, insitu mammary carcinoma w/calcif, er/pr +, her2 -   BREAST SURGERY     lumpectomy   CATARACT EXTRACTION  both eyes   ELECTROPHYSIOLOGIC STUDY N/A 05/10/2015   Procedure: SVT Ablation;  Surgeon: Will Jorja Loa, MD;  Location: [facility];  Service: Cardiovascular;  Laterality: N/A;   HERNIA REPAIR     inguinal herniorrhapy  1984   left   IR US GUIDE BX ASP/DRAIN  05/26/2019   rectal fissure repair     s/p benign breast biopsy  2003   right   s/p left foot surgury  2009   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   spiral fx left foot  2008   no surgury   TMJ ARTHROPLASTY  1989   TONGUE SURGERY     1988- to remove scar tissue growth    TONSILLECTOMY     Family History  Problem Relation Age of Onset   Hypertension Mother    Stroke Mother    Colon polyps Mother    Diabetes Mother    Pancreatic cancer Mother 29   Uterine cancer Mother 54   Lymphoma Brother        burkitts   Lung cancer Paternal Uncle    Lung cancer Maternal Grandmother 31       non-smoker   Lung cancer Maternal Grandfather    Breast cancer Cousin        maternal cousin, dx in her mid 68s   Brain cancer Cousin        maternal cousin's son; dx in his 19s   Testicular cancer Cousin        maternal cousin's son;    Breast cancer Cousin        paternal  cousin; dx in her 14s   Breast cancer Cousin        paternal cousin's daughter; dx in 21s; neg genetic testing   Colon cancer Neg Hx    Esophageal cancer Neg Hx    Stomach cancer Neg Hx    Rectal cancer Neg Hx    Social History   Socioeconomic History   Marital status: Divorced    Spouse name: 2 Step-children   Number of children: 2   Years of education: Not on file   Highest education level: Bachelor's degree (e.g., BA, AB, BS)  Occupational History   Occupation: retired Training and development officer  Tobacco Use   Smoking status: Former    Current packs/day: 0.00    Average packs/day: 1 pack/day for 54.0 years (54.0 ttl pk-yrs)    Types: Cigarettes    Start date: 01/02/1962    Quit date: 01/03/2016    Years since quitting: 6.8   Smokeless tobacco: Never  Vaping Use   Vaping status: Never Used  Substance and Sexual Activity   Alcohol use: Yes    Comment: rare/ drinks socially   Drug use: No   Sexual activity: Never  Other Topics Concern   Not on file  Social History Narrative   Patient gets no regular exercise   No biological children   2 step children   Social Determinants of Health   Financial Resource Strain: Low Risk  (11/07/2022)   Overall Financial Resource Strain (CARDIA)    Difficulty of Paying Living Expenses: Not hard at all  Food Insecurity: No Food Insecurity (11/07/2022)   Hunger Vital Sign    Worried About Running Out of Food in the Last Year: Never true    Ran Out of Food in the Last Year: Never true  Transportation Needs: No Transportation Needs (11/07/2022)   PRAPARE - Transportation  Lack of Transportation (Medical): No    Lack of Transportation (Non-Medical): No  Physical Activity: Inactive (11/07/2022)   Exercise Vital Sign    Days of Exercise per Week: 0 days    Minutes of Exercise per Session: 0 min  Stress: No Stress Concern Present (11/07/2022)   [employer] of Occupational Health - Occupational Stress Questionnaire    Feeling of Stress : Not at all   Social Connections: Socially Isolated (11/07/2022)   Social Connection and Isolation Panel [NHANES]    Frequency of Communication with Friends and Family: More than three times a week    Frequency of Social Gatherings with Friends and Family: Once a week    Attends Religious Services: Never    Database administrator or Organizations: No    Attends Engineer, structural: Never    Marital Status: Divorced    Tobacco Counseling Counseling given: Not Answered   Clinical Intake:  Pre-visit preparation completed: Yes  Pain : No/denies pain     BMI - recorded: 24.98 Nutritional Status: BMI of 19-24  Normal Nutritional Risks: Nausea/ vomitting/ diarrhea (diarrhea)  How often do you need to have someone help you when you read instructions, pamphlets, or other written materials from your doctor or pharmacy?: 1 - Never  Interpreter Needed?: No  Information entered by :: Jahshua Bonito, RMA   Activities of Daily Living    11/07/2022    1:18 AM 12/02/2021    3:05 PM  In your present state of health, do you have any difficulty performing the following activities:  Hearing? 0 0  Vision? 0 0  Difficulty concentrating or making decisions? 0 0  Walking or climbing stairs? 1 0  Dressing or bathing? 0 0  Doing errands, shopping? 0 0  Preparing Food and eating ? N N  Using the Toilet? N N  In the past six months, have you accidently leaked urine? Y N  Do you have problems with loss of bowel control? Y N  Managing your Medications? N N  Managing your Finances? N N  Housekeeping or managing your Housekeeping? N N    Patient Care Team: [name], MD as PCP - General (Internal Medicine) Margaretmary Dys, MD as Consulting Physician (Radiation Oncology) Dominica Severin, MD as Consulting Physician (Orthopedic Surgery) Judi Saa, DO as Consulting Physician (Family Medicine) Mateo Flow, MD as Consulting Physician (Ophthalmology) Axel Filler, Larna Daughters, NP as  Nurse Practitioner (Hematology and Oncology)  Indicate any recent Medical Services you may have received from other than [facility] providers in the past year (date may be approximate).     Assessment:   This is a routine wellness examination for [facility]  Hearing/Vision screen Hearing Screening - Comments:: Denies hearing difficulties   Vision Screening - Comments:: Wear eyeglasses   Goals Addressed             This Visit's Progress    Patient Stated   Not on track    Maintain my current health status.      Depression Screen    11/10/2022    2:37 PM 09/14/2022    2:30 PM 12/02/2021    3:18 PM 07/11/2021    3:39 PM 07/11/2021    3:17 PM 02/25/2021    2:00 PM 01/07/2021    3:54 PM  PHQ 2/9 Scores  PHQ - 2 Score 0 0 1 0 0 0 0  PHQ- 9 Score 0    3      Fall Risk  11/07/2022    1:18 AM 09/14/2022    2:30 PM 12/02/2021    3:09 PM 07/22/2021    3:00 PM 07/11/2021    3:38 PM  Fall Risk   Falls in the past year? 0 0 1 0 0  Comment    continues to deny falls x 12 months; not currently using assistive devices   Number falls in past yr: 0 0 0 0 0  Injury with Fall? 0 0 0 0 0  Comment    N/A- no falls reported   Risk for fall due to :  No Fall Risks History of fall(s);Impaired balance/gait;Impaired mobility Medication side effect   Follow up Falls evaluation completed;Falls prevention discussed Falls evaluation completed Falls evaluation completed Falls prevention discussed     MEDICARE RISK AT HOME: Medicare Risk at Home Any stairs in or around the home?: Yes If so, are there any without handrails?: No Home free of loose throw rugs in walkways, pet beds, electrical cords, etc?: Yes Adequate lighting in your home to reduce risk of falls?: Yes Life alert?: No Use of a cane, walker or w/c?: No Grab bars in the bathroom?: No Shower chair or bench in shower?: No Elevated toilet seat or a handicapped toilet?: No  TIMED UP AND GO:  Was the test performed?  No    Cognitive  Function:    12/02/2021    3:24 PM  MMSE - Mini Mental State Exam  Not completed: Refused        11/10/2022    2:34 PM  6CIT Screen  What Year? 0 points  What month? 0 points  What time? 0 points  Count back from 20 0 points  Months in reverse 0 points  Repeat phrase 0 points  Total Score 0 points    Immunizations Immunization History  Administered Date(s) Administered   Fluad Quad(high Dose 65+) 11/03/2018   Influenza Split 12/17/2011   Influenza Whole 12/17/2006, 10/23/2008, 10/18/2010   Influenza, High Dose Seasonal PF 11/16/2013, 11/26/2017   Influenza,inj,Quad PF,6+ Mos 11/19/2020   Influenza-Unspecified 11/02/2012, 11/17/2014, 11/05/2015, 11/03/2019   PFIZER(Purple Top)SARS-COV-2 Vaccination 02/27/2019, 03/27/2019, 09/20/2019, 08/15/2020, 11/19/2020   Pneumococcal Conjugate-13 12/15/2013   Pneumococcal Polysaccharide-23 01/20/2006   Td 06/14/2008   Tdap 02/10/2019    TDAP status: Up to date  Flu Vaccine status: Due, Education has been provided regarding the importance of this vaccine. Advised may receive this vaccine at local pharmacy or Health Dept. Aware to provide a copy of the vaccination record if obtained from local pharmacy or Health Dept. Verbalized acceptance and understanding.  Pneumococcal vaccine status: Up to date  Covid-19 vaccine status: Information provided on how to obtain vaccines.   Qualifies for Shingles Vaccine? Yes   Zostavax completed No   Shingrix Completed?: No.    Education has been provided regarding the importance of this vaccine. Patient has been advised to call insurance company to determine out of pocket expense if they have not yet received this vaccine. Advised may also receive vaccine at local pharmacy or Health Dept. Verbalized acceptance and understanding.  Screening Tests Health Maintenance  Topic Date Due   Zoster Vaccines- Shingrix (1 of 2) Never done   INFLUENZA VACCINE  09/03/2022   COVID-19 Vaccine (6 - 2023-24  season) 10/04/2022   Medicare Annual Wellness (AWV)  11/10/2023   DTaP/Tdap/Td (3 - Td or Tdap) 02/09/2029   Pneumonia Vaccine 44+ Years old  Completed   DEXA SCAN  Completed   HPV VACCINES  Aged Out  Health Maintenance  Health Maintenance Due  Topic Date Due   Zoster Vaccines- Shingrix (1 of 2) Never done   INFLUENZA VACCINE  09/03/2022   COVID-19 Vaccine (6 - 2023-24 season) 10/04/2022    Colorectal cancer screening: No longer required.   Mammogram status: No longer required due to metastases.Alton Revere Density status: Completed 2017. Results reflect: Bone density results: OSTEOPENIA. Repeat every 2 years.  Lung Cancer Screening: (Low Dose CT Chest recommended if Age 82-80 years, 20 pack-year currently smoking OR have quit w/in 15years.) does qualify.   Lung Cancer Screening Referral: 06/16/2021  Additional Screening:  Hepatitis C Screening: does not qualify;   Vision Screening: Recommended annual ophthalmology exams for early detection of glaucoma and other disorders of the eye. Is the patient up to date with their annual eye exam?  Yes  Who is the provider or what is the name of the office in which the patient attends annual eye exams? Dr. Elmer Picker If pt is not established with a provider, would they like to be referred to a provider to establish care? No .   Dental Screening: Recommended annual dental exams for proper oral hygiene    Community Resource Referral / Chronic Care Management: CRR required this visit?  No   CCM required this visit?  No     Plan:     I have personally reviewed and noted the following in the patient's chart:   Medical and social history Use of alcohol, tobacco or illicit drugs  Current medications and supplements including opioid prescriptions. Patient is not currently taking opioid prescriptions. Functional ability and status Nutritional status Physical activity Advanced directives List of other physicians Hospitalizations,  surgeries, and ER visits in previous 12 months Vitals Screenings to include cognitive, depression, and falls Referrals and appointments  In addition, I have reviewed and discussed with patient certain preventive protocols, quality metrics, and best practice recommendations. A written personalized care plan for preventive services as well as general preventive health recommendations were provided to patient.     Keeshawn Fakhouri L Treyshawn Muldrew, CMA   11/10/2022   After Visit Summary: (MyChart) Due to this being a telephonic visit, the after visit summary with patients personalized plan was offered to patient via MyChart   Nurse Notes: Patient is due for her Flu vaccine and Covid vaccine, which she will be getting soon.  Patient declines DEXA screening and the Shingles vaccine.  Patient reported asthenia after she has bowel movements.  Due to medication patient has had diarrhea consistently.  She is inquiring about some strengthening training.  Patient wants to know how to go about getting help to see a PT for her increase her strength.  I informed patient that I would send her PCP a message in regards to a referral.  She had no other concerns to address today.

## 2022-11-10 NOTE — Patient Instructions (Addendum)
Stacey [name] , Thank you for taking time to come for your Medicare Wellness Visit. I appreciate your ongoing commitment to your health goals. Please review the following plan we discussed and let me know if I can assist you in the future.   Referrals/Orders/Follow-Ups/Clinician Recommendations: You are due for a Flu and Covid vaccine. It was nice talking with you today.  Keep up the good work.   This is a list of the screening recommended for you and due dates:  Health Maintenance  Topic Date Due   Zoster (Shingles) Vaccine (1 of 2) Never done   Flu Shot  09/03/2022   COVID-19 Vaccine (6 - 2023-24 season) 10/04/2022   Medicare Annual Wellness Visit  11/10/2023   DTaP/Tdap/Td vaccine (3 - Td or Tdap) 02/09/2029   Pneumonia Vaccine  Completed   DEXA scan (bone density measurement)  Completed   HPV Vaccine  Aged Out    Advanced directives: (Copy Requested) Please bring a copy of your health care power of attorney and living will to the office to be added to your chart at your convenience.  Next Medicare Annual Wellness Visit scheduled for next year: No

## 2022-11-10 NOTE — Telephone Encounter (Signed)
Ok sure, I can refer to outpatient physical therapy

## 2022-11-13 ENCOUNTER — Other Ambulatory Visit: Payer: Self-pay

## 2022-11-13 ENCOUNTER — Encounter (HOSPITAL_COMMUNITY)
Admission: RE | Admit: 2022-11-13 | Discharge: 2022-11-13 | Disposition: A | Discharge status: Home / Self Care | Payer: Medicare Other | Source: Ambulatory Visit | Attending: Adult Health | Admitting: Adult Health

## 2022-11-13 DIAGNOSIS — C7951 Secondary malignant neoplasm of bone: Secondary | ICD-10-CM | POA: Diagnosis not present

## 2022-11-13 DIAGNOSIS — C50212 Malignant neoplasm of upper-inner quadrant of left female breast: Secondary | ICD-10-CM | POA: Insufficient documentation

## 2022-11-13 DIAGNOSIS — Z170 Estrogen receptor positive status [ER+]: Secondary | ICD-10-CM | POA: Diagnosis not present

## 2022-11-13 DIAGNOSIS — C50912 Malignant neoplasm of unspecified site of left female breast: Secondary | ICD-10-CM

## 2022-11-13 DIAGNOSIS — C50919 Malignant neoplasm of unspecified site of unspecified female breast: Secondary | ICD-10-CM | POA: Diagnosis not present

## 2022-11-13 LAB — GLUCOSE, CAPILLARY: Glucose-Capillary: 92 mg/dL (ref 70–99)

## 2022-11-13 MED ORDER — FLUDEOXYGLUCOSE F - 18 (FDG) INJECTION
7.1900 | Freq: Once | INTRAVENOUS | Status: AC
  Administered 2022-11-13: 7.19 via INTRAVENOUS

## 2022-11-13 NOTE — Progress Notes (Signed)
Specialty Pharmacy Refill Coordination Note  Stacey [name] is a 82 y.o. female contacted today regarding refills of specialty medication(s) Palbociclib   Patient requested Delivery   Delivery date: 11/27/22   Verified address: [address], [place], [address]   Medication will be filled on 11/26/22.  Patient takes 1 every day M-F, 3 weeks on, 1 week off. Has enough for next cycle, and this shipment will cover the next cycle. She should have 9 tablets remaining at next call.

## 2022-11-17 ENCOUNTER — Inpatient Hospital Stay (HOSPITAL_COMMUNITY)
Admission: EM | Admit: 2022-11-17 | Discharge: 2022-11-20 | DRG: 871 | Disposition: A | Discharge status: Home Health | Payer: Medicare Other | Attending: Internal Medicine | Admitting: Internal Medicine

## 2022-11-17 ENCOUNTER — Emergency Department (HOSPITAL_COMMUNITY): Payer: Medicare Other

## 2022-11-17 ENCOUNTER — Other Ambulatory Visit: Payer: Self-pay

## 2022-11-17 DIAGNOSIS — J9601 Acute respiratory failure with hypoxia: Secondary | ICD-10-CM | POA: Diagnosis present

## 2022-11-17 DIAGNOSIS — Z833 Family history of diabetes mellitus: Secondary | ICD-10-CM | POA: Diagnosis not present

## 2022-11-17 DIAGNOSIS — Z86718 Personal history of other venous thrombosis and embolism: Secondary | ICD-10-CM | POA: Diagnosis not present

## 2022-11-17 DIAGNOSIS — J440 Chronic obstructive pulmonary disease with acute lower respiratory infection: Secondary | ICD-10-CM | POA: Diagnosis present

## 2022-11-17 DIAGNOSIS — J189 Pneumonia, unspecified organism: Secondary | ICD-10-CM | POA: Diagnosis not present

## 2022-11-17 DIAGNOSIS — Z8049 Family history of malignant neoplasm of other genital organs: Secondary | ICD-10-CM

## 2022-11-17 DIAGNOSIS — C7951 Secondary malignant neoplasm of bone: Secondary | ICD-10-CM | POA: Diagnosis present

## 2022-11-17 DIAGNOSIS — Z1152 Encounter for screening for COVID-19: Secondary | ICD-10-CM | POA: Diagnosis not present

## 2022-11-17 DIAGNOSIS — Z9221 Personal history of antineoplastic chemotherapy: Secondary | ICD-10-CM | POA: Diagnosis not present

## 2022-11-17 DIAGNOSIS — R911 Solitary pulmonary nodule: Secondary | ICD-10-CM | POA: Diagnosis present

## 2022-11-17 DIAGNOSIS — R000 Tachycardia, unspecified: Secondary | ICD-10-CM | POA: Diagnosis not present

## 2022-11-17 DIAGNOSIS — R0902 Hypoxemia: Secondary | ICD-10-CM | POA: Diagnosis not present

## 2022-11-17 DIAGNOSIS — Z7901 Long term (current) use of anticoagulants: Secondary | ICD-10-CM

## 2022-11-17 DIAGNOSIS — E860 Dehydration: Secondary | ICD-10-CM | POA: Diagnosis present

## 2022-11-17 DIAGNOSIS — K219 Gastro-esophageal reflux disease without esophagitis: Secondary | ICD-10-CM | POA: Diagnosis present

## 2022-11-17 DIAGNOSIS — Z803 Family history of malignant neoplasm of breast: Secondary | ICD-10-CM

## 2022-11-17 DIAGNOSIS — Z170 Estrogen receptor positive status [ER+]: Secondary | ICD-10-CM

## 2022-11-17 DIAGNOSIS — Z9841 Cataract extraction status, right eye: Secondary | ICD-10-CM

## 2022-11-17 DIAGNOSIS — Z7952 Long term (current) use of systemic steroids: Secondary | ICD-10-CM | POA: Diagnosis not present

## 2022-11-17 DIAGNOSIS — R5383 Other fatigue: Secondary | ICD-10-CM | POA: Diagnosis not present

## 2022-11-17 DIAGNOSIS — F419 Anxiety disorder, unspecified: Secondary | ICD-10-CM | POA: Diagnosis present

## 2022-11-17 DIAGNOSIS — Z800 Family history of malignant neoplasm of digestive organs: Secondary | ICD-10-CM | POA: Diagnosis not present

## 2022-11-17 DIAGNOSIS — Z885 Allergy status to narcotic agent status: Secondary | ICD-10-CM

## 2022-11-17 DIAGNOSIS — Z79899 Other long term (current) drug therapy: Secondary | ICD-10-CM

## 2022-11-17 DIAGNOSIS — A419 Sepsis, unspecified organism: Principal | ICD-10-CM | POA: Diagnosis present

## 2022-11-17 DIAGNOSIS — W19XXXA Unspecified fall, initial encounter: Secondary | ICD-10-CM | POA: Diagnosis not present

## 2022-11-17 DIAGNOSIS — R0602 Shortness of breath: Secondary | ICD-10-CM | POA: Diagnosis not present

## 2022-11-17 DIAGNOSIS — R6520 Severe sepsis without septic shock: Secondary | ICD-10-CM | POA: Diagnosis not present

## 2022-11-17 DIAGNOSIS — R918 Other nonspecific abnormal finding of lung field: Secondary | ICD-10-CM | POA: Diagnosis not present

## 2022-11-17 DIAGNOSIS — C50212 Malignant neoplasm of upper-inner quadrant of left female breast: Secondary | ICD-10-CM | POA: Diagnosis present

## 2022-11-17 DIAGNOSIS — R0682 Tachypnea, not elsewhere classified: Secondary | ICD-10-CM | POA: Diagnosis not present

## 2022-11-17 DIAGNOSIS — Z807 Family history of other malignant neoplasms of lymphoid, hematopoietic and related tissues: Secondary | ICD-10-CM

## 2022-11-17 DIAGNOSIS — K7689 Other specified diseases of liver: Secondary | ICD-10-CM | POA: Diagnosis not present

## 2022-11-17 DIAGNOSIS — K529 Noninfective gastroenteritis and colitis, unspecified: Secondary | ICD-10-CM | POA: Diagnosis not present

## 2022-11-17 DIAGNOSIS — E785 Hyperlipidemia, unspecified: Secondary | ICD-10-CM | POA: Diagnosis not present

## 2022-11-17 DIAGNOSIS — Z91030 Bee allergy status: Secondary | ICD-10-CM

## 2022-11-17 DIAGNOSIS — Z801 Family history of malignant neoplasm of trachea, bronchus and lung: Secondary | ICD-10-CM | POA: Diagnosis not present

## 2022-11-17 DIAGNOSIS — I10 Essential (primary) hypertension: Secondary | ICD-10-CM | POA: Diagnosis present

## 2022-11-17 DIAGNOSIS — Z87891 Personal history of nicotine dependence: Secondary | ICD-10-CM

## 2022-11-17 DIAGNOSIS — Z9842 Cataract extraction status, left eye: Secondary | ICD-10-CM

## 2022-11-17 DIAGNOSIS — Z823 Family history of stroke: Secondary | ICD-10-CM | POA: Diagnosis not present

## 2022-11-17 DIAGNOSIS — Z808 Family history of malignant neoplasm of other organs or systems: Secondary | ICD-10-CM

## 2022-11-17 DIAGNOSIS — Z923 Personal history of irradiation: Secondary | ICD-10-CM

## 2022-11-17 DIAGNOSIS — Z8249 Family history of ischemic heart disease and other diseases of the circulatory system: Secondary | ICD-10-CM | POA: Diagnosis not present

## 2022-11-17 DIAGNOSIS — R6883 Chills (without fever): Secondary | ICD-10-CM | POA: Diagnosis not present

## 2022-11-17 DIAGNOSIS — R0689 Other abnormalities of breathing: Secondary | ICD-10-CM | POA: Diagnosis not present

## 2022-11-17 DIAGNOSIS — Z7982 Long term (current) use of aspirin: Secondary | ICD-10-CM

## 2022-11-17 DIAGNOSIS — Z888 Allergy status to other drugs, medicaments and biological substances status: Secondary | ICD-10-CM

## 2022-11-17 LAB — COMPREHENSIVE METABOLIC PANEL WITH GFR
ALT: 17 U/L (ref 0–44)
AST: 28 U/L (ref 15–41)
Albumin: 3.3 g/dL — ABNORMAL LOW (ref 3.5–5.0)
Alkaline Phosphatase: 61 U/L (ref 38–126)
Anion gap: 12 (ref 5–15)
BUN: 26 mg/dL — ABNORMAL HIGH (ref 8–23)
CO2: 19 mmol/L — ABNORMAL LOW (ref 22–32)
Calcium: 8.3 mg/dL — ABNORMAL LOW (ref 8.9–10.3)
Chloride: 106 mmol/L (ref 98–111)
Creatinine, Ser: 1.54 mg/dL — ABNORMAL HIGH (ref 0.44–1.00)
GFR, Estimated: 34 mL/min — ABNORMAL LOW
Glucose, Bld: 121 mg/dL — ABNORMAL HIGH (ref 70–99)
Potassium: 4.2 mmol/L (ref 3.5–5.1)
Sodium: 137 mmol/L (ref 135–145)
Total Bilirubin: 0.7 mg/dL (ref 0.3–1.2)
Total Protein: 7.3 g/dL (ref 6.5–8.1)

## 2022-11-17 LAB — CBC WITH DIFFERENTIAL/PLATELET
Abs Immature Granulocytes: 0.02 10*3/uL (ref 0.00–0.07)
Basophils Absolute: 0 10*3/uL (ref 0.0–0.1)
Basophils Relative: 1 %
Eosinophils Absolute: 0.1 10*3/uL (ref 0.0–0.5)
Eosinophils Relative: 2 %
HCT: 29.6 % — ABNORMAL LOW (ref 36.0–46.0)
Hemoglobin: 9.7 g/dL — ABNORMAL LOW (ref 12.0–15.0)
Immature Granulocytes: 1 %
Lymphocytes Relative: 7 %
Lymphs Abs: 0.2 10*3/uL — ABNORMAL LOW (ref 0.7–4.0)
MCH: 35.4 pg — ABNORMAL HIGH (ref 26.0–34.0)
MCHC: 32.8 g/dL (ref 30.0–36.0)
MCV: 108 fL — ABNORMAL HIGH (ref 80.0–100.0)
Monocytes Absolute: 0.2 10*3/uL (ref 0.1–1.0)
Monocytes Relative: 8 %
Neutro Abs: 2.5 10*3/uL (ref 1.7–7.7)
Neutrophils Relative %: 81 %
Platelets: 110 10*3/uL — ABNORMAL LOW (ref 150–400)
RBC: 2.74 MIL/uL — ABNORMAL LOW (ref 3.87–5.11)
RDW: 15.1 % (ref 11.5–15.5)
WBC: 3 10*3/uL — ABNORMAL LOW (ref 4.0–10.5)
nRBC: 0 % (ref 0.0–0.2)

## 2022-11-17 LAB — RESP PANEL BY RT-PCR (RSV, FLU A&B, COVID)  RVPGX2
Influenza A by PCR: NEGATIVE
Influenza B by PCR: NEGATIVE
Resp Syncytial Virus by PCR: NEGATIVE
SARS Coronavirus 2 by RT PCR: NEGATIVE

## 2022-11-17 LAB — I-STAT CG4 LACTIC ACID, ED
Lactic Acid, Venous: 2.5 mmol/L (ref 0.5–1.9)
Lactic Acid, Venous: 1.4 mmol/L (ref 0.5–1.9)

## 2022-11-17 LAB — PROTIME-INR
INR: 1.1 (ref 0.8–1.2)
Prothrombin Time: 14.4 s (ref 11.4–15.2)

## 2022-11-17 MED ORDER — ANASTROZOLE 1 MG PO TABS
1.0000 mg | ORAL_TABLET | Freq: Every day | ORAL | Status: DC
  Administered 2022-11-18 – 2022-11-20 (×3): 1 mg via ORAL
  Filled 2022-11-17 (×4): qty 1

## 2022-11-17 MED ORDER — ENOXAPARIN SODIUM 30 MG/0.3ML IJ SOSY: 30.0000 mg | PREFILLED_SYRINGE | INTRAMUSCULAR | Status: DC

## 2022-11-17 MED ORDER — ONDANSETRON HCL 4 MG/2ML IJ SOLN: 4.0000 mg | Freq: Four times a day (QID) | INTRAMUSCULAR | Status: DC | PRN

## 2022-11-17 MED ORDER — LOPERAMIDE HCL 2 MG PO CAPS
2.0000 mg | ORAL_CAPSULE | ORAL | Status: DC | PRN
  Administered 2022-11-18: 4 mg via ORAL
  Administered 2022-11-19: 2 mg via ORAL
  Filled 2022-11-17: qty 2
  Filled 2022-11-17: qty 1

## 2022-11-17 MED ORDER — ACETAMINOPHEN 325 MG PO TABS: 650.0000 mg | ORAL_TABLET | Freq: Once | ORAL | Status: AC

## 2022-11-17 MED ORDER — DOXYCYCLINE HYCLATE 100 MG PO TABS
100.0000 mg | ORAL_TABLET | Freq: Two times a day (BID) | ORAL | Status: DC
  Administered 2022-11-18 – 2022-11-20 (×5): 100 mg via ORAL
  Filled 2022-11-17 (×6): qty 1

## 2022-11-17 MED ORDER — DOXYCYCLINE HYCLATE 100 MG PO TABS: 100.0000 mg | ORAL_TABLET | Freq: Once | ORAL | Status: AC

## 2022-11-17 MED ORDER — ACETAMINOPHEN 325 MG PO TABS: 650.0000 mg | ORAL_TABLET | Freq: Four times a day (QID) | ORAL | Status: DC | PRN

## 2022-11-17 MED ORDER — PANTOPRAZOLE SODIUM 40 MG PO TBEC
40.0000 mg | DELAYED_RELEASE_TABLET | Freq: Every day | ORAL | Status: DC
  Administered 2022-11-18 – 2022-11-20 (×3): 40 mg via ORAL
  Filled 2022-11-17 (×3): qty 1

## 2022-11-17 MED ORDER — RIVAROXABAN 20 MG PO TABS
20.0000 mg | ORAL_TABLET | Freq: Every day | ORAL | Status: DC
  Administered 2022-11-18 – 2022-11-19 (×2): 20 mg via ORAL
  Filled 2022-11-17 (×2): qty 1

## 2022-11-17 MED ORDER — IOHEXOL 350 MG/ML SOLN
80.0000 mL | Freq: Once | INTRAVENOUS | Status: AC | PRN
  Administered 2022-11-17: 80 mL via INTRAVENOUS

## 2022-11-17 MED ORDER — ONDANSETRON HCL 4 MG PO TABS: 4.0000 mg | ORAL_TABLET | Freq: Four times a day (QID) | ORAL | Status: DC | PRN

## 2022-11-17 MED ORDER — DEXAMETHASONE 4 MG PO TABS
2.0000 mg | ORAL_TABLET | Freq: Every day | ORAL | Status: DC
  Administered 2022-11-18 – 2022-11-20 (×3): 2 mg via ORAL
  Filled 2022-11-17 (×3): qty 1

## 2022-11-17 MED ORDER — ACETAMINOPHEN 650 MG RE SUPP: 650.0000 mg | Freq: Four times a day (QID) | RECTAL | Status: DC | PRN

## 2022-11-17 MED ORDER — MAGNESIUM OXIDE -MG SUPPLEMENT 400 (240 MG) MG PO TABS: 800.0000 mg | ORAL_TABLET | Freq: Every morning | ORAL | Status: DC

## 2022-11-17 MED ORDER — LACTATED RINGERS IV SOLN: INTRAVENOUS | Status: AC

## 2022-11-17 MED ORDER — LORATADINE 10 MG PO TABS
10.0000 mg | ORAL_TABLET | Freq: Every morning | ORAL | Status: DC
  Administered 2022-11-18 – 2022-11-20 (×3): 10 mg via ORAL
  Filled 2022-11-17 (×3): qty 1

## 2022-11-17 MED ORDER — ASPIRIN 81 MG PO TBEC
81.0000 mg | DELAYED_RELEASE_TABLET | Freq: Every morning | ORAL | Status: DC
  Administered 2022-11-18 – 2022-11-20 (×3): 81 mg via ORAL
  Filled 2022-11-17 (×3): qty 1

## 2022-11-17 MED ORDER — SODIUM CHLORIDE 0.9 % IV SOLN
2.0000 g | INTRAVENOUS | Status: DC
  Administered 2022-11-17 – 2022-11-19 (×3): 2 g via INTRAVENOUS
  Filled 2022-11-17 (×3): qty 20

## 2022-11-17 MED ORDER — VITAMIN B-12 100 MCG PO TABS
100.0000 ug | ORAL_TABLET | Freq: Every day | ORAL | Status: DC
  Administered 2022-11-19 – 2022-11-20 (×2): 100 ug via ORAL
  Filled 2022-11-17 (×3): qty 1

## 2022-11-17 MED ORDER — DOXYCYCLINE HYCLATE 100 MG PO TABS
ORAL_TABLET | ORAL | Status: AC
  Administered 2022-11-17: 100 mg via ORAL
  Filled 2022-11-17: qty 1

## 2022-11-17 MED ORDER — ACETAMINOPHEN 325 MG PO TABS
ORAL_TABLET | ORAL | Status: AC
  Administered 2022-11-17: 650 mg via ORAL
  Filled 2022-11-17: qty 1

## 2022-11-17 MED ORDER — ZOLPIDEM TARTRATE 5 MG PO TABS
5.0000 mg | ORAL_TABLET | Freq: Every evening | ORAL | Status: DC | PRN
  Administered 2022-11-18 – 2022-11-19 (×2): 5 mg via ORAL
  Filled 2022-11-17 (×2): qty 1

## 2022-11-17 MED ORDER — MAGNESIUM 200 MG PO TABS
250.0000 mg | ORAL_TABLET | Freq: Every morning | ORAL | Status: DC
Start: 2022-11-18 — End: 2022-11-17

## 2022-11-17 MED ORDER — ADULT MULTIVITAMIN W/MINERALS CH
1.0000 | ORAL_TABLET | Freq: Every morning | ORAL | Status: DC
  Administered 2022-11-18 – 2022-11-20 (×3): 1 via ORAL
  Filled 2022-11-17 (×3): qty 1

## 2022-11-17 NOTE — Assessment & Plan Note (Addendum)
Pt with 4/4 SIRS.  ? Respiratory source given her tachypnea. Sepsis pathway Getting IVF maint Tele monitor Lactate 2.5 -> 1.4 Persistent tachypnea despite this. 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 MAI?

## 2022-11-17 NOTE — Assessment & Plan Note (Signed)
Continue Xarelto

## 2022-11-17 NOTE — ED Provider Notes (Signed)
[place] EMERGENCY DEPARTMENT AT [facility] Provider Note   CSN: [id number] Arrival date & time: 11/17/22  1503     History  Chief Complaint  Patient presents with   Fatigue   Chills    Stacey [name] is a 82 y.o. female.  HPI    82 year old female comes in with chief complaint of fatigue, weakness. Patient has past medical history of metastatic breast cancer for which she is getting chemo.  She indicates that she has been having increasing weakness, shortness of breath and chills over the last 3 to 4 days.  She started having fevers she thinks 2 days ago.  She feels profoundly weak and called her family members to bring her to the ER today.  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  Home Medications Prior to Admission medications   Medication Sig Start Date End Date Taking? Authorizing Provider  acetaminophen (TYLENOL) 500 MG tablet Take 1,000 mg by mouth See admin instructions. Take 2 tablets (1000 mg) by mouth daily at bedtime, may also take 2 tablets (1000 mg) two more times during the day or night as needed for pain    [provider]  anastrozole (ARIMIDEX) 1 MG tablet TAKE 1 TABLET(1 MG) BY MOUTH DAILY 09/21/22   Loa Socks, NP  aspirin 81 MG EC tablet Take 81 mg by mouth every morning.    [provider]  CALCIUM CARBONATE-VITAMIN D PO Take 1 tablet by mouth 2 (two) times daily.    [provider]  Cholecalciferol (VITAMIN D3 PO) Take 1 capsule by mouth every morning.    [provider]  cholestyramine (QUESTRAN) 4 g packet Take 1 packet (4 g total) by mouth 3 (three) times daily with meals. 05/08/22   Rachel Moulds, MD  clotrimazole-betamethasone (LOTRISONE) cream Apply 1 application topically 2 (two) times daily as needed. Patient taking differently: Apply 1 application  topically 2 (two) times daily as needed (rash/yeast in corners of mouth). 03/31/21   [name], MD  dexamethasone (DECADRON)  4 MG tablet Take 0.5 tablets (2 mg total) by mouth daily. 10/23/22   Loa Socks, NP  diphenhydrAMINE (BENADRYL) 25 MG tablet Take 25 mg by mouth every 6 (six) hours as needed (for bee stings).     [provider]  fulvestrant (FASLODEX) 250 MG/5ML injection Inject 250 mg into the muscle every 28 (twenty-eight) days. 11/23/19   [provider]  gabapentin (NEURONTIN) 100 MG capsule TAKE 1 CAPSULE(100 MG) BY MOUTH AT BEDTIME 12/10/21   Iruku, Burnice Logan, MD  loperamide (IMODIUM) 2 MG capsule 2-4 mg See admin instructions. Take 2 tablets (4 mg) by mouth for first loose stool, then 1 tablet (2 mg) for every loose stool thereafter. Do not exceed 8 tablets (16 mg) in a 24 hour period. Stop loperamide if 12 hours have passed without a loose stool. 09/27/20   Magrinat, Valentino Hue, MD  loratadine (CLARITIN) 10 MG tablet Take 10 mg by mouth every morning.    [provider]  Magnesium 500 MG CAPS Take 500 mg by mouth every morning. Take with a 250 mg capsule for a total dose of 750 mg daily    [provider]  MAGNESIUM PO Take 250 mg by mouth every morning. Take with a 500 mg capsule for a total dose of 750 mg daily    [provider]  Multiple Vitamin (MULTIVITAMIN WITH MINERALS) TABS tablet Take 1 tablet by mouth every morning.  Centrum    [provider]  omeprazole (PRILOSEC) 20 MG capsule Take 1 capsule by mouth daily 09/14/22   [name], MD  palbociclib Ilda Foil) 100 MG tablet Take 1 tablet (100 mg total) by mouth Monday through Friday. Take as directed by MD. 03/11/22   Rachel Moulds, MD  prochlorperazine (COMPAZINE) 10 MG tablet Take 1 tablet (10 mg total) by mouth every 6 (six) hours as needed for nausea or vomiting. 06/26/19   Magrinat, Valentino Hue, MD  rivaroxaban (XARELTO) 20 MG TABS tablet Take 1 tablet (20 mg total) by mouth daily with supper. 05/13/22   Loa Socks, NP  telmisartan (MICARDIS) 40 MG tablet Take 1 tablet (40 mg  total) by mouth every morning. 09/14/22   [name], MD  triamcinolone (NASACORT) 55 MCG/ACT AERO nasal inhaler Place 2 sprays into the nose daily. 2 sprays each nostril at night before bedtime Patient taking differently: Place 2 sprays into the nose See admin instructions. Instill 2 sprays into each nostril every night before bedtime 10/14/20   Drema Halon, MD  triamterene-hydrochlorothiazide (DYAZIDE) 37.5-25 MG capsule Take 1 each (1 capsule total) by mouth every morning. 09/14/22   [name], MD  vitamin B-12 (CYANOCOBALAMIN) 100 MCG tablet Take 100 mcg by mouth daily.    [provider]  zolpidem (AMBIEN) 10 MG tablet Take 1 tablet (10 mg total) by mouth at bedtime as needed for sleep. 10/07/22   [name], MD      Allergies    Aminoglycosides, Bacitracin, Bee venom, Cephalexin, Clindamycin/lincomycin, and Codeine    Review of Systems   Review of Systems  All other systems reviewed and are negative.   Physical Exam Updated Vital Signs BP 119/60   Pulse 95   Temp (!) 101.7 F (38.7 C) (Oral)   Resp (!) 37   Ht 5\' 3"  (1.6 m)   Wt 62.6 kg   SpO2 100%   BMI 24.45 kg/m  Physical Exam Vitals and nursing note reviewed.  Constitutional:      Appearance: She is well-developed.  HENT:     Head: Atraumatic.  Cardiovascular:     Rate and Rhythm: Normal rate.  Pulmonary:     Effort: Pulmonary effort is normal.     Breath sounds: No wheezing.     Comments: Tachypnea Musculoskeletal:        General: No tenderness.     Cervical back: Normal range of motion and neck supple.     Right lower leg: No edema.     Left lower leg: No edema.  Skin:    General: Skin is warm and dry.  Neurological:     Mental Status: She is alert and oriented to person, place, and time.     ED Results / Procedures / Treatments   Labs (all labs ordered are listed, but only abnormal results are displayed) Labs Reviewed  COMPREHENSIVE METABOLIC PANEL - Abnormal; Notable for  the following components:      Result Value   CO2 19 (*)    Glucose, Bld 121 (*)    BUN 26 (*)    Creatinine, Ser 1.54 (*)    Calcium 8.3 (*)    Albumin 3.3 (*)    GFR, Estimated 34 (*)    All other components within normal limits  CBC WITH DIFFERENTIAL/PLATELET - Abnormal; Notable for the following components:   WBC 3.0 (*)    RBC 2.74 (*)    Hemoglobin 9.7 (*)  HCT 29.6 (*)    MCV 108.0 (*)    MCH 35.4 (*)    Platelets 110 (*)    Lymphs Abs 0.2 (*)    All other components within normal limits  I-STAT CG4 LACTIC ACID, ED - Abnormal; Notable for the following components:   Lactic Acid, Venous 2.5 (*)    All other components within normal limits  RESP PANEL BY RT-PCR (RSV, FLU A&B, COVID)  RVPGX2  CULTURE, BLOOD (ROUTINE X 2)  CULTURE, BLOOD (ROUTINE X 2)  SARS CORONAVIRUS 2 BY RT PCR  RESPIRATORY PANEL BY PCR  PROTIME-INR  URINALYSIS, W/ REFLEX TO CULTURE (INFECTION SUSPECTED)  MAGNESIUM  I-STAT CG4 LACTIC ACID, ED    EKG None  Radiology DG Chest 2 View  Result Date: 11/17/2022 CLINICAL DATA:  Chills and fatigue EXAM: CHEST - 2 VIEW COMPARISON:  Chest radiograph 06/16/2021, PET-CT 11/13/2022 FINDINGS: The cardiomediastinal silhouette is normal. Mildly coarsened interstitial markings in both lungs similar to the prior study. There is no evidence of acute airspace opacity. There is no pulmonary edema. There is no pleural effusion or pneumothorax Left breast and axillary surgical clips are noted. There is no acute osseous abnormality. IMPRESSION: No radiographic evidence of acute cardiopulmonary process. Electronically Signed   By: Lesia Hausen M.D.   On: 11/17/2022 18:08    Procedures .Critical Care  Performed by: Derwood Kaplan, MD Authorized by: Derwood Kaplan, MD   Critical care provider statement:    Critical care time (minutes):  48   Critical care was necessary to treat or prevent imminent or life-threatening deterioration of the following conditions:   Respiratory failure   Critical care was time spent personally by me on the following activities:  Development of treatment plan with patient or surrogate, discussions with consultants, evaluation of patient's response to treatment, examination of patient, ordering and review of laboratory studies, ordering and review of radiographic studies, ordering and performing treatments and interventions, pulse oximetry, re-evaluation of patient's condition and review of old charts     Medications Ordered in ED Medications  cefTRIAXone (ROCEPHIN) 2 g in sodium chloride 0.9 % 100 mL IVPB (0 g Intravenous Stopped 11/17/22 1907)  doxycycline (VIBRA-TABS) tablet 100 mg (has no administration in time range)  acetaminophen (TYLENOL) tablet 650 mg (has no administration in time range)  iohexol (OMNIPAQUE) 350 MG/ML injection 80 mL (80 mLs Intravenous Contrast Given 11/17/22 2105)    ED Course/ Medical Decision Making/ A&P                                 Medical Decision Making Amount and/or Complexity of Data Reviewed Labs: ordered. Radiology: ordered.  Risk Prescription drug management.   This patient presents to the ED with chief complaint(s) of generalized weakness, shortness of breath, fevers, chills with pertinent past medical history of breast cancer with metastases currently on chemotherapy.The complaint involves an extensive differential diagnosis and also carries with it a high risk of complications and morbidity.  Patient has new 2 L of oxygen on board.  The differential diagnosis includes : Acute hypoxic respiratory failure, pneumonia, COVID-19, PE, CHF.   The initial plan is to get basic labs, COVID-19 test, chest x-ray. Sepsis workup initiated.   Additional history obtained: Additional history obtained from family Records reviewed  oncology records, medications including DOAC  Independent labs interpretation:  The following labs were independently interpreted: No severe anemia, mild  leukopenia noted.  Initial lactate  is slightly elevated.  Independent visualization and interpretation of imaging: - I independently visualized the following imaging with scope of interpretation limited to determining acute life threatening conditions related to emergency care: X-ray of the chest, which revealed no evidence of large pleural effusion.  Treatment and Reassessment: Patient reassessed.  O2 sats dropped into 91% when I had patient moved. CT PE ordered as the x-ray is not impressive and patient has hypoxia.  CT shows questionable pneumonia in the lower lung fields. Medicine called for admission.  Final Clinical Impression(s) / ED Diagnoses Final diagnoses:  Community acquired pneumonia, unspecified laterality  Acute hypoxemic respiratory failure (HCC)    Rx / DC Orders ED Discharge Orders     None         Derwood Kaplan, MD 11/17/22 2210

## 2022-11-17 NOTE — Sepsis Progress Note (Signed)
Elink monitoring for the code sepsis protocol.  

## 2022-11-17 NOTE — Assessment & Plan Note (Addendum)
Pt with severe tachypnea at rest, RR of 35-40. Satting 100% on O2 via [place] Pt tells me that the tachypnea is baseline, chronic, and secondary to the [place]. Daughter told EDP that pt has tachypnea at baseline but seems a bit worse today. Regardless: working this up since RR of 40 is abnormal. CTA chest showing stable chronic tree in bud opacities in the RLL.  Also has a 5mm pulm nodule. Empirically treating as CAP as above COVID, FLU, RSV, are neg Cont pulse ox Check VBG (unable to get stick for ABG). Chronic tree in bud opacities... MAI?

## 2022-11-17 NOTE — Sepsis Progress Note (Signed)
Notified bedside nurse of need to draw repeat lactic acid.

## 2022-11-17 NOTE — Assessment & Plan Note (Signed)
Pt on Decadron 2mg  daily chronically it seems. Low threshold to go to stress dose steroids if pt worsens in setting of suspected sepsis. But no hypotension, tachycardia has resolved at this time, no N/V, has diarrhea but that's chronic. Will continue Decadron 2mg  daily for the moment.

## 2022-11-17 NOTE — Assessment & Plan Note (Signed)
Cancer with metastatic dz history (looks like mets to bone). Pt currently on Ibrance M-F, Fulvestrant Q28 days, Arimidex Cont Arimidex Holding Ibrance in setting of sepsis admission IP consult to oncology put into Epic.

## 2022-11-17 NOTE — Assessment & Plan Note (Addendum)
5mm pulm nodule. No follow up needed if low risk per rads read Regardless, presumably this will get followed up / looked at as a matter of course since pt is on Q 6 month PET scans for her metastatic breast CA.

## 2022-11-17 NOTE — ED Triage Notes (Signed)
Pt BIBA from home. C/o chills and fatigue for several days. Pt has breast cancer and takes Ibrance.  Pt did hit their nose last night on their coffee cup. Small lac on bride of nose. Does take blood Xarelto.  Aox4

## 2022-11-18 ENCOUNTER — Telehealth: Payer: Self-pay

## 2022-11-18 DIAGNOSIS — A419 Sepsis, unspecified organism: Secondary | ICD-10-CM | POA: Diagnosis not present

## 2022-11-18 DIAGNOSIS — J189 Pneumonia, unspecified organism: Secondary | ICD-10-CM

## 2022-11-18 DIAGNOSIS — Z86718 Personal history of other venous thrombosis and embolism: Secondary | ICD-10-CM

## 2022-11-18 DIAGNOSIS — J9601 Acute respiratory failure with hypoxia: Secondary | ICD-10-CM

## 2022-11-18 DIAGNOSIS — R0682 Tachypnea, not elsewhere classified: Secondary | ICD-10-CM | POA: Diagnosis not present

## 2022-11-18 DIAGNOSIS — R911 Solitary pulmonary nodule: Secondary | ICD-10-CM

## 2022-11-18 DIAGNOSIS — Z7952 Long term (current) use of systemic steroids: Secondary | ICD-10-CM

## 2022-11-18 DIAGNOSIS — C50212 Malignant neoplasm of upper-inner quadrant of left female breast: Secondary | ICD-10-CM

## 2022-11-18 DIAGNOSIS — Z170 Estrogen receptor positive status [ER+]: Secondary | ICD-10-CM

## 2022-11-18 LAB — COMPREHENSIVE METABOLIC PANEL
ALT: 14 U/L (ref 0–44)
AST: 33 U/L (ref 15–41)
Albumin: 2.4 g/dL — ABNORMAL LOW (ref 3.5–5.0)
Alkaline Phosphatase: 46 U/L (ref 38–126)
Anion gap: 16 — ABNORMAL HIGH (ref 5–15)
BUN: 18 mg/dL (ref 8–23)
CO2: 15 mmol/L — ABNORMAL LOW (ref 22–32)
Calcium: 7.8 mg/dL — ABNORMAL LOW (ref 8.9–10.3)
Chloride: 104 mmol/L (ref 98–111)
Creatinine, Ser: 1.09 mg/dL — ABNORMAL HIGH (ref 0.44–1.00)
GFR, Estimated: 51 mL/min — ABNORMAL LOW (ref >60)
Glucose, Bld: 93 mg/dL (ref 70–99)
Potassium: 4.2 mmol/L (ref 3.5–5.1)
Sodium: 135 mmol/L (ref 135–145)
Total Bilirubin: 1 mg/dL (ref 0.3–1.2)
Total Protein: 5.4 g/dL — ABNORMAL LOW (ref 6.5–8.1)

## 2022-11-18 LAB — RESPIRATORY PANEL BY PCR

## 2022-11-18 LAB — PROTIME-INR
INR: 1.3 — ABNORMAL HIGH (ref 0.8–1.2)
Prothrombin Time: 15.9 s — ABNORMAL HIGH (ref 11.4–15.2)

## 2022-11-18 LAB — MAGNESIUM: Magnesium: 1.2 mg/dL — ABNORMAL LOW (ref 1.7–2.4)

## 2022-11-18 LAB — BLOOD GAS, VENOUS
Acid-base deficit: 3.9 mmol/L — ABNORMAL HIGH (ref 0.0–2.0)
Bicarbonate: 19.5 mmol/L — ABNORMAL LOW (ref 20.0–28.0)
O2 Saturation: 74.7 %
Patient temperature: 37
pCO2, Ven: 30 mm[Hg] — ABNORMAL LOW (ref 44–60)
pH, Ven: 7.42 (ref 7.25–7.43)
pO2, Ven: 42 mm[Hg] (ref 32–45)

## 2022-11-18 LAB — HEMOGLOBIN AND HEMATOCRIT, BLOOD
HCT: 26.4 % — ABNORMAL LOW (ref 36.0–46.0)
Hemoglobin: 8.5 g/dL — ABNORMAL LOW (ref 12.0–15.0)

## 2022-11-18 LAB — MRSA NEXT GEN BY PCR, NASAL: MRSA by PCR Next Gen: NOT DETECTED

## 2022-11-18 LAB — PROCALCITONIN: Procalcitonin: 0.37 ng/mL

## 2022-11-18 LAB — CORTISOL-AM, BLOOD: Cortisol - AM: 8.1 ug/dL (ref 6.7–22.6)

## 2022-11-18 MED ORDER — MAGNESIUM SULFATE 4 GM/100ML IV SOLN
4.0000 g | Freq: Once | INTRAVENOUS | Status: AC
  Administered 2022-11-18: 4 g via INTRAVENOUS
  Filled 2022-11-18: qty 100

## 2022-11-18 MED ORDER — CHLORHEXIDINE GLUCONATE CLOTH 2 % EX PADS
6.0000 | MEDICATED_PAD | Freq: Every day | CUTANEOUS | Status: DC
  Administered 2022-11-18 – 2022-11-19 (×2): 6 via TOPICAL

## 2022-11-18 MED ORDER — ORAL CARE MOUTH RINSE: 15.0000 mL | OROMUCOSAL | Status: DC | PRN

## 2022-11-18 MED ORDER — GERHARDT'S BUTT CREAM
TOPICAL_CREAM | Freq: Two times a day (BID) | CUTANEOUS | Status: DC
  Administered 2022-11-19 (×2): 1 via TOPICAL
  Filled 2022-11-18: qty 1

## 2022-11-18 NOTE — ED Notes (Addendum)
Pt assisted onto bsc for bm, maintained O2 sats >93% on 3L [place].

## 2022-11-18 NOTE — H&P (Signed)
History and Physical    Patient: Stacey [name] ZOX:[id number] DOB: 10/28/1940 DOA: 11/17/2022 DOS: the patient was seen and examined on 11/18/2022 PCP: [name], MD  Patient coming from: Home  Chief Complaint:  Chief Complaint  Patient presents with   Fatigue   Chills   HPI: Stacey [name] is a 82 y.o. female with medical history significant of breast CA with bone mets, on chronic immunotherapy.  Pt in to ED with increasing weakness, chills over past 3-4 days.  Fevers ~2 days ago.  Pt with tachypnea, states that she always has SOB and tachypnea for past couple of years since starting ibrance.  She feels that this is her baseline, her daughter feels that it is worse than baseline.  Has chronic diarrhea, daily for past ~3 years that is baseline.  No abd pain, no skin rash, no CP, headache, cough, URI symptoms or other focal symptoms to otherwise give a clue as to source of infection.  H/o DVT, on xarelto.  Review of Systems: As mentioned in the history of present illness. All other systems reviewed and are negative. Past Medical History:  Diagnosis Date   ANEMIA-NOS    ANXIETY    Breast cancer (HCC) 1997 L, 2012 R   s/p chemo/xrt   COPD    resolved   DIVERTICULOSIS, COLON 2008   Dizziness    Family history of breast cancer    Family history of lung cancer    Family history of lymphoma    Family history of pancreatic cancer    Family history of uterine cancer    GERD    Hx of radiation therapy 10/19/11 -12/03/11   right breast   HYPERLIPIDEMIA    IRRITABLE BOWEL SYNDROME, HX OF    Left-sided carotid artery disease (HCC)    moderate left ICA stenosis   OSTEOARTHRITIS, HAND    PSVT (paroxysmal supraventricular tachycardia) (HCC)    symptomatic on event monitor   Past Surgical History:  Procedure Laterality Date   ABDOMINAL HYSTERECTOMY     APPENDECTOMY     BREAST BIOPSY  11/14/10    r breast: inv, insitu mammary carcinoma w/calcif, er/pr +, her2 -   BREAST  SURGERY     lumpectomy   CATARACT EXTRACTION     both eyes   ELECTROPHYSIOLOGIC STUDY N/A 05/10/2015   Procedure: SVT Ablation;  Surgeon: Will Jorja Loa, MD;  Location: [facility];  Service: Cardiovascular;  Laterality: N/A;   HERNIA REPAIR     inguinal herniorrhapy  1984   left   IR US GUIDE BX ASP/DRAIN  05/26/2019   rectal fissure repair     s/p benign breast biopsy  2003   right   s/p left foot surgury  2009   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   spiral fx left foot  2008   no surgury   TMJ ARTHROPLASTY  1989   TONGUE SURGERY     1988- to remove scar tissue growth    TONSILLECTOMY     Social History:  reports that she quit smoking about 6 years ago. Her smoking use included cigarettes. She started smoking about 60 years ago. She has a 54 pack-year smoking history. She has never used smokeless tobacco. She reports current alcohol use. She reports that she does not use drugs.  Allergies  Allergen Reactions   Aminoglycosides Other (See Comments)    Unknown reaction - pt is not sure where this entry  came from   Bacitracin Other (See Comments)   Bee Venom Swelling    Severe body swelling   Cephalexin Other (See Comments)   Clindamycin/Lincomycin Diarrhea and Nausea And Vomiting   Codeine Nausea And Vomiting    Pt can take codeine cough syrup.    Family History  Problem Relation Age of Onset   Hypertension Mother    Stroke Mother    Colon polyps Mother    Diabetes Mother    Pancreatic cancer Mother 66   Uterine cancer Mother 51   Lymphoma Brother        burkitts   Lung cancer Paternal Uncle    Lung cancer Maternal Grandmother 49       non-smoker   Lung cancer Maternal Grandfather    Breast cancer Cousin        maternal cousin, dx in her mid 41s   Brain cancer Cousin        maternal cousin's son; dx in his 33s   Testicular cancer Cousin        maternal cousin's son;    Breast cancer Cousin        paternal cousin; dx in her 63s   Breast cancer  Cousin        paternal cousin's daughter; dx in 20s; neg genetic testing   Colon cancer Neg Hx    Esophageal cancer Neg Hx    Stomach cancer Neg Hx    Rectal cancer Neg Hx     Prior to Admission medications   Medication Sig Start Date End Date Taking? Authorizing Provider  acetaminophen (TYLENOL) 500 MG tablet Take 1,000 mg by mouth See admin instructions. Take 2 tablets (1000 mg) by mouth daily at bedtime, may also take 2 tablets (1000 mg) two more times during the day or night as needed for pain   Yes [provider]  anastrozole (ARIMIDEX) 1 MG tablet TAKE 1 TABLET(1 MG) BY MOUTH DAILY 09/21/22  Yes Causey, Larna Daughters, NP  aspirin 81 MG EC tablet Take 81 mg by mouth every morning.   Yes [provider]  CALCIUM CARBONATE-VITAMIN D PO Take 1 tablet by mouth 2 (two) times daily.   Yes [provider]  Cholecalciferol (VITAMIN D3 PO) Take 1 capsule by mouth every morning.   Yes [provider]  dexamethasone (DECADRON) 4 MG tablet Take 0.5 tablets (2 mg total) by mouth daily. 10/23/22  Yes Causey, Larna Daughters, NP  diphenhydrAMINE (BENADRYL) 25 MG tablet Take 25 mg by mouth every 6 (six) hours as needed (for bee stings).    Yes [provider]  fulvestrant (FASLODEX) 250 MG/5ML injection Inject 250 mg into the muscle every 28 (twenty-eight) days. 11/23/19  Yes [provider]  loperamide (IMODIUM) 2 MG capsule 2-4 mg See admin instructions. Take 2 tablets (4 mg) by mouth for first loose stool, then 1 tablet (2 mg) for every loose stool thereafter. Do not exceed 8 tablets (16 mg) in a 24 hour period. Stop loperamide if 12 hours have passed without a loose stool. 09/27/20  Yes Magrinat, Valentino Hue, MD  loratadine (CLARITIN) 10 MG tablet Take 10 mg by mouth every morning.   Yes [provider]  Magnesium 500 MG CAPS Take 500 mg by mouth every morning. Take with a 250 mg capsule for a total dose of 750 mg daily   Yes [provider]  MAGNESIUM PO Take 250 mg by mouth every morning. Take with a 500 mg capsule for  a total dose of 750 mg daily   Yes [provider]  Multiple Vitamin (MULTIVITAMIN WITH MINERALS) TABS tablet Take 1 tablet by mouth every morning. Centrum   Yes [provider]  omeprazole (PRILOSEC) 20 MG capsule Take 1 capsule by mouth daily 09/14/22  Yes [name], MD  palbociclib [facility]) 100 MG tablet Take 1 tablet (100 mg total) by mouth Monday through Friday. Take as directed by MD. 03/11/22  Yes Rachel Moulds, MD  prochlorperazine (COMPAZINE) 10 MG tablet Take 1 tablet (10 mg total) by mouth every 6 (six) hours as needed for nausea or vomiting. 06/26/19  Yes Magrinat, Valentino Hue, MD  rivaroxaban (XARELTO) 20 MG TABS tablet Take 1 tablet (20 mg total) by mouth daily with supper. 05/13/22  Yes Causey, Larna Daughters, NP  telmisartan (MICARDIS) 40 MG tablet Take 1 tablet (40 mg total) by mouth every morning. 09/14/22  Yes [name], MD  triamcinolone (NASACORT) 55 MCG/ACT AERO nasal inhaler Place 2 sprays into the nose daily. 2 sprays each nostril at night before bedtime Patient taking differently: Place 2 sprays into the nose See admin instructions. Instill 2 sprays into each nostril every night before bedtime 10/14/20  Yes Drema Halon, MD  triamterene-hydrochlorothiazide (DYAZIDE) 37.5-25 MG capsule Take 1 each (1 capsule total) by mouth every morning. 09/14/22  Yes [name], MD  vitamin B-12 (CYANOCOBALAMIN) 100 MCG tablet Take 100 mcg by mouth daily.   Yes [provider]  zolpidem (AMBIEN) 10 MG tablet Take 1 tablet (10 mg total) by mouth at bedtime as needed for sleep. 10/07/22  Yes [name], MD    Physical Exam: Vitals:   11/17/22 2100 11/17/22 2130 11/17/22 2200 11/17/22 2300  BP: 138/63 139/67 119/60   Pulse: (!) 107 (!) 105 95   Resp: 20 (!) 34 (!) 37   Temp:    99.1 F (37.3 C)  TempSrc:      SpO2: 93% 98% 100%   Weight:       Height:       Constitutional: Tachypnic, but not seemingly in respiratory distress. Respiratory: Tachypnea with RR of 40. Cardiovascular: Regular rate and rhythm, no murmurs / rubs / gallops. No extremity edema. 2+ pedal pulses. No carotid bruits.  Abdomen: no tenderness, no masses palpated. No hepatosplenomegaly. Bowel sounds positive.  Skin: no rashes, lesions, ulcers. No induration Neurologic: CN 2-12 grossly intact. Sensation intact, DTR normal. Strength 5/5 in all 4.  Psychiatric: Normal judgment and insight. Alert and oriented x 3. Normal mood.   Data Reviewed:    Labs on Admission: I have personally reviewed following labs and imaging studies  CBC: Recent Labs  Lab 11/17/22 1600  WBC 3.0*  NEUTROABS 2.5  HGB 9.7*  HCT 29.6*  MCV 108.0*  PLT 110*   Basic Metabolic Panel: Recent Labs  Lab 11/17/22 1600 11/17/22 2249  NA 137  --   K 4.2  --   CL 106  --   CO2 19*  --   GLUCOSE 121*  --   BUN 26*  --   CREATININE 1.54*  --   CALCIUM 8.3*  --   MG  --  1.2*   GFR: Estimated Creatinine Clearance: 23.3 mL/min (A) (by C-G formula based on SCr of 1.54 mg/dL (H)). Liver Function Tests: Recent Labs  Lab 11/17/22 1600  AST 28  ALT 17  ALKPHOS 61  BILITOT 0.7  PROT 7.3  ALBUMIN 3.3*   No results for  input(s): "LIPASE", "AMYLASE" in the last 168 hours. No results for input(s): "AMMONIA" in the last 168 hours. Coagulation Profile: Recent Labs  Lab 11/17/22 1600  INR 1.1   Cardiac Enzymes: No results for input(s): "CKTOTAL", "CKMB", "CKMBINDEX", "TROPONINI" in the last 168 hours. BNP (last 3 results) No results for input(s): "PROBNP" in the last 8760 hours. HbA1C: No results for input(s): "HGBA1C" in the last 72 hours. CBG: Recent Labs  Lab 11/13/22 1422  GLUCAP 92   Lipid Profile: No results for input(s): "CHOL", "HDL", "LDLCALC", "TRIG", "CHOLHDL", "LDLDIRECT" in the last 72 hours. Thyroid Function Tests: No results for input(s): "TSH",  "T4TOTAL", "FREET4", "T3FREE", "THYROIDAB" in the last 72 hours. Anemia Panel: No results for input(s): "VITAMINB12", "FOLATE", "FERRITIN", "TIBC", "IRON", "RETICCTPCT" in the last 72 hours. Urine analysis:    Component Value Date/Time   COLORURINE YELLOW 07/13/2018 0938   APPEARANCEUR [place] (A) 07/13/2018 0938   LABSPEC 1.020 07/13/2018 0938   PHURINE 6.0 07/13/2018 0938   GLUCOSEU NEGATIVE 07/13/2018 0938   HGBUR NEGATIVE 07/13/2018 0938   BILIRUBINUR NEGATIVE 07/13/2018 0938   KETONESUR NEGATIVE 07/13/2018 0938   UROBILINOGEN 0.2 07/13/2018 0938   NITRITE NEGATIVE 07/13/2018 0938   LEUKOCYTESUR TRACE (A) 07/13/2018 0938    Radiological Exams on Admission: CT Angio Chest PE W and/or Wo Contrast  Result Date: 11/17/2022 CLINICAL DATA:  Shortness of breath and fatigue. History of metastatic breast cancer. EXAM: CT ANGIOGRAPHY CHEST WITH CONTRAST TECHNIQUE: Multidetector CT imaging of the chest was performed using the standard protocol during bolus administration of intravenous contrast. Multiplanar CT image reconstructions and MIPs were obtained to evaluate the vascular anatomy. RADIATION DOSE REDUCTION: This exam was performed according to the departmental dose-optimization program which includes automated exposure control, adjustment of the mA and/or kV according to patient size and/or use of iterative reconstruction technique. CONTRAST:  80mL OMNIPAQUE IOHEXOL 350 MG/ML SOLN COMPARISON:  CT angiogram chest 06/16/2021 FINDINGS: Cardiovascular: Satisfactory opacification of the pulmonary arteries to the segmental level. No evidence of pulmonary embolism. Normal heart size. No pericardial effusion. The main pulmonary artery is enlarged compatible with pulmonary artery hypertension. There are atherosclerotic calcifications of the aorta. Mediastinum/Nodes: No enlarged mediastinal, hilar, or axillary lymph nodes. Thyroid gland, trachea, and esophagus demonstrate no significant findings. There  is a large hiatal hernia, unchanged. Lungs/Pleura: Moderate emphysematous changes are again noted. Postradiation changes in the anterior right upper lobe appear unchanged. Right upper lobe nodule measuring 5 mm image 2/73 appears unchanged. There is dependent atelectasis bilaterally. Minimal patchy airspace and tree-in-bud opacities in the right lower lobe appear similar to prior. There is no pleural effusion or pneumothorax. There is scarring in both lung apices. Upper Abdomen: Left hepatic cysts measuring up to 2.2 cm appear similar to prior. Musculoskeletal: Focal density in the right breast with surrounding surgical clips appears unchanged measuring 1.4 x 3.0 cm. Left breast surgical clips and calcifications are again seen. No acute fractures are identified. Review of the MIP images confirms the above findings. IMPRESSION: 1. No evidence for pulmonary embolism. 2. Stable patchy airspace and tree-in-bud opacities in the right lower lobe, likely infectious/inflammatory. 3. Stable 5 mm right upper lobe pulmonary nodule. No follow-up needed if patient is low-risk.This recommendation follows the consensus statement: Guidelines for Management of Incidental Pulmonary Nodules Detected on CT Images: From the Fleischner Society 2017; Radiology 2017; 284:228-243. 4. Stable large hiatal hernia. 5. Aortic atherosclerosis. Aortic Atherosclerosis (ICD10-I70.0). Electronically Signed   By: Darliss Cheney M.D.   On: 11/17/2022 23:15  DG Chest 2 View  Result Date: 11/17/2022 CLINICAL DATA:  Chills and fatigue EXAM: CHEST - 2 VIEW COMPARISON:  Chest radiograph 06/16/2021, PET-CT 11/13/2022 FINDINGS: The cardiomediastinal silhouette is normal. Mildly coarsened interstitial markings in both lungs similar to the prior study. There is no evidence of acute airspace opacity. There is no pulmonary edema. There is no pleural effusion or pneumothorax Left breast and axillary surgical clips are noted. There is no acute osseous  abnormality. IMPRESSION: No radiographic evidence of acute cardiopulmonary process. Electronically Signed   By: Lesia Hausen M.D.   On: 11/17/2022 18:08    EKG: Independently reviewed.   Assessment and Plan: * Sepsis (HCC) Pt with 4/4 SIRS.  ? Respiratory source given her tachypnea. Sepsis pathway Getting IVF maint Tele monitor Lactate 2.5 -> 1.4 Persistent tachypnea despite this. 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 MAI?   Tachypnea Pt with severe tachypnea at rest, RR of 35-40. Satting 100% on O2 via [place] Pt tells me that the tachypnea is baseline, chronic, and secondary to the [place]. Daughter told EDP that pt has tachypnea at baseline but seems a bit worse today. Regardless: working this up since RR of 40 is abnormal. CTA chest showing stable chronic tree in bud opacities in the RLL.  Also has a 5mm pulm nodule. Empirically treating as CAP as above COVID, FLU, RSV, are neg Cont pulse ox Check VBG (unable to get stick for ABG). Chronic tree in bud opacities... MAI?  Current chronic use of systemic steroids Pt on Decadron 2mg  daily chronically it seems. Low threshold to go to stress dose steroids if pt worsens in setting of suspected sepsis. But no hypotension, tachycardia has resolved at this time, no N/V, has diarrhea but that's chronic. Will continue Decadron 2mg  daily for the moment.  History of DVT (deep vein thrombosis) Continue Xarelto  Malignant neoplasm of upper-inner quadrant of left breast in female, estrogen receptor positive (HCC) Cancer with metastatic dz history (looks like mets to bone). Pt currently on Ibrance M-F, Fulvestrant Q28 days, Arimidex Cont Arimidex Holding Ibrance in setting of sepsis admission IP consult to oncology put into Epic.  Pulmonary nodule 5mm pulm nodule. No follow up needed if low risk per rads read Regardless, presumably this will get followed up / looked at  as a matter of course since pt is on Q 6 month PET scans for her metastatic breast CA.      Advance Care Planning:   Code Status: Full Code  Consults: None  Family Communication: No family in room  Severity of Illness: The appropriate patient status for this patient is INPATIENT. Inpatient status is judged to be reasonable and necessary in order to provide the required intensity of service to ensure the patient's safety. The patient's presenting symptoms, physical exam findings, and initial radiographic and laboratory data in the context of their chronic comorbidities is felt to place them at high risk for further clinical deterioration. Furthermore, it is not anticipated that the patient will be medically stable for discharge from the hospital within 2 midnights of admission.   * I certify that at the point of admission it is my clinical judgment that the patient will require inpatient hospital care spanning beyond 2 midnights from the point of admission due to high intensity of service, high risk for further deterioration and high frequency of surveillance required.*  Author: Hillary Bow., DO 11/18/2022 12:14 AM  For  on call review [email or web address].

## 2022-11-18 NOTE — Hospital Course (Signed)
82 y.o. female with medical history significant of breast CA with bone mets, on chronic immunotherapy. Pt presented with concerns of sepsis from unclear source

## 2022-11-18 NOTE — Progress Notes (Signed)
Progress Note   Patient: Stacey [name] QMV:[id number] DOB: 10-Aug-1940 DOA: 11/17/2022     1 DOS: the patient was seen and examined on 11/18/2022   Brief hospital course: 82 y.o. female with medical history significant of breast CA with bone mets, on chronic immunotherapy. Pt presented with concerns of sepsis from unclear source  Assessment and Plan: Sepsis secondary to likely pneumonia present on admit [facility]) -CT chest reviewed. Findings of patchy airspace and tree-in-bud opacities in RLL -Pt continued on empiric doxycycline with rocephin -remains on 4LNC. Pt is O2 naive -recheck CBC in AM -follow up on culture results   Tachypnea with acute hypoxemic respiratory distress Pt with severe tachypnea at rest, RR of 35-40 at presentation -CTA neg for PE, reviewed -Continued on 4LNC. Pt is O2 naive -COVD neg   Current chronic use of systemic steroids Pt on Decadron 2mg  daily chronically.   History of DVT (deep vein thrombosis) Continue Xarelto   Malignant neoplasm of upper-inner quadrant of left breast in female, estrogen receptor positive (HCC) Cancer with metastatic dz history Pt currently on Ibrance M-F, Fulvestrant Q28 days, Arimidex Cont Arimidex Cont to hold Ibrance in setting of sepsis admission Have tagged pt's primary Oncologist    Pulmonary nodule 5mm pulm nodule. No follow up needed if low risk per rads read   Subjective: Reports feeling weak this AM  Physical Exam: Vitals:   11/18/22 0948 11/18/22 1000 11/18/22 1100 11/18/22 1200  BP: (!) 132/46 (!) 130/49 (!) 135/49   Pulse: 91 90 96   Resp: (!) 26 (!) 25 18   Temp:    98.1 F (36.7 C)  TempSrc:    Oral  SpO2: 91% 94% 96%   Weight:      Height:       General exam: Awake, laying in bed, in nad Respiratory system: slightly increased respiratory effort, no wheezing Cardiovascular system: regular rate, s1, s2 Gastrointestinal system: Soft, nondistended, positive BS Central nervous system: CN2-12 grossly  intact, strength intact Extremities: Perfused, no clubbing Skin: Normal skin turgor, no notable skin lesions seen Psychiatry: Mood normal // no visual hallucinations   Data Reviewed:  Labs reviewed: Na 135, K 4.2, Cr 1.09, Hgb 8.5  Family Communication: Pt in room, family not at bedside  Disposition: Status is: Inpatient Remains inpatient appropriate because: Severity of illness  Planned Discharge Destination: Rehab     Author: Rickey Barbara, MD 11/18/2022 3:22 PM  For on call review [email or web address].

## 2022-11-18 NOTE — ED Notes (Signed)
Lab called w/ a critical Hgb 6.1, attempted to give attending  provider Lyda Perone a call @[phone] to report value but no answer. Will redraw lab for accuracy to compare.

## 2022-11-18 NOTE — ED Notes (Signed)
ED TO INPATIENT HANDOFF REPORT  Name/Age/Gender Stacey [name] 82 y.o. female  Code Status    Code Status Orders  (From admission, onward)           Start     Ordered   11/17/22 2212  Full code  Continuous       Question:  By:  Answer:  Default: patient does not have capacity for decision making, no surrogate or prior directive available   11/17/22 2213           Code Status History     This patient has a current code status but no historical code status.      Advance Directive Documentation    Flowsheet Row Most Recent Value  Type of Advance Directive Healthcare Power of Attorney, Living will  Pre-existing out of facility DNR order (yellow form or pink MOST form) --  "MOST" Form in Place? --       Home/SNF/Other Home   Chief Complaint Sepsis (HCC) [A41.9]  Level of Care/Admitting Diagnosis ED Disposition     ED Disposition  Admit   Condition  --   Comment  Hospital Area: [facility] [[id number]]  Level of Care: Stepdown [14]  Admit to SDU based on following criteria: Respiratory Distress:  Frequent assessment and/or intervention to maintain adequate ventilation/respiration, pulmonary toilet, and respiratory treatment.  Admit to SDU based on following criteria: Severe physiological/psychological symptoms:  Any diagnosis requiring assessment & intervention at least every 4 hours on an ongoing basis to obtain desired patient outcomes including stability and rehabilitation  May admit patient to [facility] or [facility] if equivalent level of care is available:: No  Covid Evaluation: Asymptomatic - no recent exposure (last 10 days) testing not required  Diagnosis: Sepsis [facility]) [[id number]]  Admitting Physician: Hillary Bow [[id number]]  Attending Physician: Hillary Bow [phone]  Certification:: I certify this patient will need inpatient services for at least 2 midnights  Expected Medical Readiness: 11/20/2022          Medical  History Past Medical History:  Diagnosis Date   ANEMIA-NOS    ANXIETY    Breast cancer (HCC) 1997 L, 2012 R   s/p chemo/xrt   COPD    resolved   DIVERTICULOSIS, COLON 2008   Dizziness    Family history of breast cancer    Family history of lung cancer    Family history of lymphoma    Family history of pancreatic cancer    Family history of uterine cancer    GERD    Hx of radiation therapy 10/19/11 -12/03/11   right breast   HYPERLIPIDEMIA    IRRITABLE BOWEL SYNDROME, HX OF    Left-sided carotid artery disease (HCC)    moderate left ICA stenosis   OSTEOARTHRITIS, HAND    PSVT (paroxysmal supraventricular tachycardia) (HCC)    symptomatic on event monitor    Allergies Allergies  Allergen Reactions   Aminoglycosides Other (See Comments)    Unknown reaction - pt is not sure where this entry came from   Bacitracin Other (See Comments)   Bee Venom Swelling    Severe body swelling   Cephalexin Other (See Comments)   Clindamycin/Lincomycin Diarrhea and Nausea And Vomiting   Codeine Nausea And Vomiting    Pt can take codeine cough syrup.    IV Location/Drains/Wounds Patient Lines/Drains/Airways Status     Active Line/Drains/Airways     Name Placement date Placement time Site Days   Peripheral IV  11/17/22 20 G Anterior;Proximal;Right Forearm 11/17/22  1602  Forearm  1            Labs/Imaging Results for orders placed or performed during the hospital encounter of 11/17/22 (from the past 48 hour(s))  Comprehensive metabolic panel     Status: Abnormal   Collection Time: 11/17/22  4:00 PM  Result Value Ref Range   Sodium 137 135 - 145 mmol/L   Potassium 4.2 3.5 - 5.1 mmol/L   Chloride 106 98 - 111 mmol/L   CO2 19 (L) 22 - 32 mmol/L   Glucose, Bld 121 (H) 70 - 99 mg/dL    Comment: Glucose reference range applies only to samples taken after fasting for at least 8 hours.   BUN 26 (H) 8 - 23 mg/dL   Creatinine, Ser 7.82 (H) 0.44 - 1.00 mg/dL   Calcium 8.3 (L) 8.9 -  10.3 mg/dL   Total Protein 7.3 6.5 - 8.1 g/dL   Albumin 3.3 (L) 3.5 - 5.0 g/dL   AST 28 15 - 41 U/L   ALT 17 0 - 44 U/L   Alkaline Phosphatase 61 38 - 126 U/L   Total Bilirubin 0.7 0.3 - 1.2 mg/dL   GFR, Estimated 34 (L) >60 mL/min    Comment: (NOTE) Calculated using the CKD-EPI Creatinine Equation (2021)    Anion gap 12 5 - 15    Comment: Performed at [facility], [address], [place], [place] [address]  CBC with Differential     Status: Abnormal   Collection Time: 11/17/22  4:00 PM  Result Value Ref Range   WBC 3.0 (L) 4.0 - 10.5 K/uL   RBC 2.74 (L) 3.87 - 5.11 MIL/uL   Hemoglobin 9.7 (L) 12.0 - 15.0 g/dL   HCT 30.8 (L) 65.7 - 84.6 %   MCV 108.0 (H) 80.0 - 100.0 fL   MCH 35.4 (H) 26.0 - 34.0 pg   MCHC 32.8 30.0 - 36.0 g/dL   RDW 96.2 95.2 - 84.1 %   Platelets 110 (L) 150 - 400 K/uL   nRBC 0.0 0.0 - 0.2 %   Neutrophils Relative % 81 %   Neutro Abs 2.5 1.7 - 7.7 K/uL   Lymphocytes Relative 7 %   Lymphs Abs 0.2 (L) 0.7 - 4.0 K/uL   Monocytes Relative 8 %   Monocytes Absolute 0.2 0.1 - 1.0 K/uL   Eosinophils Relative 2 %   Eosinophils Absolute 0.1 0.0 - 0.5 K/uL   Basophils Relative 1 %   Basophils Absolute 0.0 0.0 - 0.1 K/uL   Immature Granulocytes 1 %   Abs Immature Granulocytes 0.02 0.00 - 0.07 K/uL    Comment: Performed at [facility], [address], [place], [place] [address]  Protime-INR     Status: None   Collection Time: 11/17/22  4:00 PM  Result Value Ref Range   Prothrombin Time 14.4 11.4 - 15.2 seconds   INR 1.1 0.8 - 1.2    Comment: (NOTE) INR goal varies based on device and disease states. Performed at [facility], [address], [place], [place] [address]   I-Stat Lactic Acid, ED     Status: Abnormal   Collection Time: 11/17/22  4:09 PM  Result Value Ref Range   Lactic Acid, Venous 2.5 (HH) 0.5 - 1.9 mmol/L   Comment NOTIFIED PHYSICIAN   Resp panel by RT-PCR (RSV, Flu A&B, Covid)  Anterior Nasal Swab     Status: None  Collection Time: 11/17/22  5:41 PM   Specimen: Anterior Nasal Swab  Result Value Ref Range   SARS Coronavirus 2 by RT PCR NEGATIVE NEGATIVE    Comment: (NOTE) SARS-CoV-2 target nucleic acids are NOT DETECTED.  The SARS-CoV-2 RNA is generally detectable in upper respiratory specimens during the acute phase of infection. The lowest concentration of SARS-CoV-2 viral copies this assay can detect is 138 copies/mL. A negative result does not preclude SARS-Cov-2 infection and should not be used as the sole basis for treatment or other patient management decisions. A negative result may occur with  improper specimen collection/handling, submission of specimen other than nasopharyngeal swab, presence of viral mutation(s) within the areas targeted by this assay, and inadequate number of viral copies(<138 copies/mL). A negative result must be combined with clinical observations, patient history, and epidemiological information. The expected result is Negative.  Fact Sheet for Patients:  BloggerCourse.com  Fact Sheet for Healthcare Providers:  SeriousBroker.it  This test is no t yet approved or cleared by the [place] FDA and  has been authorized for detection and/or diagnosis of SARS-CoV-2 by FDA under an Emergency Use Authorization (EUA). This EUA will remain  in effect (meaning this test can be used) for the duration of the COVID-19 declaration under Section 564(b)(1) of the Act, 21 U.S.C.section 360bbb-3(b)(1), unless the authorization is terminated  or revoked sooner.       Influenza A by PCR NEGATIVE NEGATIVE   Influenza B by PCR NEGATIVE NEGATIVE    Comment: (NOTE) The Xpert Xpress SARS-CoV-2/FLU/RSV plus assay is intended as an aid in the diagnosis of influenza from Nasopharyngeal swab specimens and should not be used as a sole basis for treatment. Nasal washings and aspirates are  unacceptable for Xpert Xpress SARS-CoV-2/FLU/RSV testing.  Fact Sheet for Patients: BloggerCourse.com  Fact Sheet for Healthcare Providers: SeriousBroker.it  This test is not yet approved or cleared by the [place] FDA and has been authorized for detection and/or diagnosis of SARS-CoV-2 by FDA under an Emergency Use Authorization (EUA). This EUA will remain in effect (meaning this test can be used) for the duration of the COVID-19 declaration under Section 564(b)(1) of the Act, 21 U.S.C. section 360bbb-3(b)(1), unless the authorization is terminated or revoked.     Resp Syncytial Virus by PCR NEGATIVE NEGATIVE    Comment: (NOTE) Fact Sheet for Patients: BloggerCourse.com  Fact Sheet for Healthcare Providers: SeriousBroker.it  This test is not yet approved or cleared by the [place] FDA and has been authorized for detection and/or diagnosis of SARS-CoV-2 by FDA under an Emergency Use Authorization (EUA). This EUA will remain in effect (meaning this test can be used) for the duration of the COVID-19 declaration under Section 564(b)(1) of the Act, 21 U.S.C. section 360bbb-3(b)(1), unless the authorization is terminated or revoked.  Performed at [facility], [address] [place], [place] [address]   I-Stat Lactic Acid, ED     Status: None   Collection Time: 11/17/22  6:18 PM  Result Value Ref Range   Lactic Acid, Venous 1.4 0.5 - 1.9 mmol/L  Magnesium     Status: Abnormal   Collection Time: 11/17/22 10:49 PM  Result Value Ref Range   Magnesium 1.2 (L) 1.7 - 2.4 mg/dL    Comment: Performed at [facility], [address], [place], [place] [address]  Respiratory (~20 pathogens) panel by PCR     Status: None   Collection Time: 11/17/22 10:49 PM   Specimen: Nasopharyngeal Swab;  Respiratory  Result Value Ref Range   Adenovirus  NOT DETECTED NOT DETECTED   Coronavirus 229E NOT DETECTED NOT DETECTED    Comment: (NOTE) The Coronavirus on the Respiratory Panel, DOES NOT test for the novel  Coronavirus (2019 nCoV)    Coronavirus HKU1 NOT DETECTED NOT DETECTED   Coronavirus NL63 NOT DETECTED NOT DETECTED   Coronavirus OC43 NOT DETECTED NOT DETECTED   Metapneumovirus NOT DETECTED NOT DETECTED   Rhinovirus / Enterovirus NOT DETECTED NOT DETECTED   Influenza A NOT DETECTED NOT DETECTED   Influenza B NOT DETECTED NOT DETECTED   Parainfluenza Virus 1 NOT DETECTED NOT DETECTED   Parainfluenza Virus 2 NOT DETECTED NOT DETECTED   Parainfluenza Virus 3 NOT DETECTED NOT DETECTED   Parainfluenza Virus 4 NOT DETECTED NOT DETECTED   Respiratory Syncytial Virus NOT DETECTED NOT DETECTED   Bordetella pertussis NOT DETECTED NOT DETECTED   Bordetella Parapertussis NOT DETECTED NOT DETECTED   Chlamydophila pneumoniae NOT DETECTED NOT DETECTED   Mycoplasma pneumoniae NOT DETECTED NOT DETECTED    Comment: Performed at [facility], [address] [place], [place] [address]  Blood gas, venous     Status: Abnormal   Collection Time: 11/18/22 12:13 AM  Result Value Ref Range   pH, Ven 7.42 7.25 - 7.43   pCO2, Ven 30 (L) 44 - 60 mmHg   pO2, Ven 42 32 - 45 mmHg   Bicarbonate 19.5 (L) 20.0 - 28.0 mmol/L   Acid-base deficit 3.9 (H) 0.0 - 2.0 mmol/L   O2 Saturation 74.7 %   Patient temperature 37.0     Comment: Performed at [facility], [address], [place], [place] [address]  Protime-INR     Status: Abnormal   Collection Time: 11/18/22  4:55 AM  Result Value Ref Range   Prothrombin Time 15.9 (H) 11.4 - 15.2 seconds   INR 1.3 (H) 0.8 - 1.2    Comment: (NOTE) INR goal varies based on device and disease states. Performed at [facility], [address] [place], [place] [address]   Procalcitonin     Status: None   Collection Time: 11/18/22  4:55 AM  Result Value Ref Range    Procalcitonin 0.37 ng/mL    Comment:        Interpretation: PCT (Procalcitonin) <= 0.5 ng/mL: Systemic infection (sepsis) is not likely. Local bacterial infection is possible. (NOTE)       Sepsis PCT Algorithm           Lower Respiratory Tract                                      Infection PCT Algorithm    ----------------------------     ----------------------------         PCT < 0.25 ng/mL                PCT < 0.10 ng/mL          Strongly encourage             Strongly discourage   discontinuation of antibiotics    initiation of antibiotics    ----------------------------     -----------------------------       PCT 0.25 - 0.50 ng/mL            PCT 0.10 - 0.25 ng/mL  OR       >80% decrease in PCT            Discourage initiation of                                            antibiotics      Encourage discontinuation           of antibiotics    ----------------------------     -----------------------------         PCT >= 0.50 ng/mL              PCT 0.26 - 0.50 ng/mL               AND        <80% decrease in PCT             Encourage initiation of                                             antibiotics       Encourage continuation           of antibiotics    ----------------------------     -----------------------------        PCT >= 0.50 ng/mL                  PCT > 0.50 ng/mL               AND         increase in PCT                  Strongly encourage                                      initiation of antibiotics    Strongly encourage escalation           of antibiotics                                     -----------------------------                                           PCT <= 0.25 ng/mL                                                 OR                                        > 80% decrease in PCT                                      Discontinue / Do not initiate  antibiotics  Performed at [facility], [address] [place], [place] [address]   Comprehensive metabolic panel     Status: Abnormal   Collection Time: 11/18/22  4:55 AM  Result Value Ref Range   Sodium 135 135 - 145 mmol/L   Potassium 4.2 3.5 - 5.1 mmol/L    Comment: HEMOLYSIS AT THIS LEVEL MAY AFFECT RESULT   Chloride 104 98 - 111 mmol/L   CO2 15 (L) 22 - 32 mmol/L   Glucose, Bld 93 70 - 99 mg/dL    Comment: Glucose reference range applies only to samples taken after fasting for at least 8 hours.   BUN 18 8 - 23 mg/dL   Creatinine, Ser 6.04 (H) 0.44 - 1.00 mg/dL   Calcium 7.8 (L) 8.9 - 10.3 mg/dL   Total Protein 5.4 (L) 6.5 - 8.1 g/dL   Albumin 2.4 (L) 3.5 - 5.0 g/dL   AST 33 15 - 41 U/L    Comment: HEMOLYSIS AT THIS LEVEL MAY AFFECT RESULT   ALT 14 0 - 44 U/L    Comment: HEMOLYSIS AT THIS LEVEL MAY AFFECT RESULT   Alkaline Phosphatase 46 38 - 126 U/L   Total Bilirubin 1.0 0.3 - 1.2 mg/dL    Comment: HEMOLYSIS AT THIS LEVEL MAY AFFECT RESULT   GFR, Estimated 51 (L) >60 mL/min    Comment: (NOTE) Calculated using the CKD-EPI Creatinine Equation (2021)    Anion gap 16 (H) 5 - 15    Comment: Performed at [facility], [address] [place], [place] [address]  Hemoglobin and hematocrit, blood     Status: Abnormal   Collection Time: 11/18/22  6:06 AM  Result Value Ref Range   Hemoglobin 8.5 (L) 12.0 - 15.0 g/dL    Comment: REPEATED TO VERIFY DELTA NOTED.    HCT 26.4 (L) 36.0 - 46.0 %    Comment: Performed at [facility], [address], [place], [place] [address]   *Note: Due to a large number of results and/or encounters for the requested time period, some results have not been displayed. A complete set of results can be found in Results Review.   CT Angio Chest PE W and/or Wo Contrast  Result Date: 11/17/2022 CLINICAL DATA:  Shortness of breath and fatigue. History of metastatic breast cancer. EXAM: CT ANGIOGRAPHY CHEST WITH CONTRAST TECHNIQUE: Multidetector  CT imaging of the chest was performed using the standard protocol during bolus administration of intravenous contrast. Multiplanar CT image reconstructions and MIPs were obtained to evaluate the vascular anatomy. RADIATION DOSE REDUCTION: This exam was performed according to the departmental dose-optimization program which includes automated exposure control, adjustment of the mA and/or kV according to patient size and/or use of iterative reconstruction technique. CONTRAST:  80mL OMNIPAQUE IOHEXOL 350 MG/ML SOLN COMPARISON:  CT angiogram chest 06/16/2021 FINDINGS: Cardiovascular: Satisfactory opacification of the pulmonary arteries to the segmental level. No evidence of pulmonary embolism. Normal heart size. No pericardial effusion. The main pulmonary artery is enlarged compatible with pulmonary artery hypertension. There are atherosclerotic calcifications of the aorta. Mediastinum/Nodes: No enlarged mediastinal, hilar, or axillary lymph nodes. Thyroid gland, trachea, and esophagus demonstrate no significant findings. There is a large hiatal hernia, unchanged. Lungs/Pleura: Moderate emphysematous changes are again noted. Postradiation changes in the anterior right upper lobe appear unchanged. Right upper lobe nodule measuring 5 mm image 2/73 appears unchanged. There is dependent atelectasis bilaterally. Minimal patchy airspace and tree-in-bud opacities in the right lower lobe appear  similar to prior. There is no pleural effusion or pneumothorax. There is scarring in both lung apices. Upper Abdomen: Left hepatic cysts measuring up to 2.2 cm appear similar to prior. Musculoskeletal: Focal density in the right breast with surrounding surgical clips appears unchanged measuring 1.4 x 3.0 cm. Left breast surgical clips and calcifications are again seen. No acute fractures are identified. Review of the MIP images confirms the above findings. IMPRESSION: 1. No evidence for pulmonary embolism. 2. Stable patchy airspace and  tree-in-bud opacities in the right lower lobe, likely infectious/inflammatory. 3. Stable 5 mm right upper lobe pulmonary nodule. No follow-up needed if patient is low-risk.This recommendation follows the consensus statement: Guidelines for Management of Incidental Pulmonary Nodules Detected on CT Images: From the Fleischner Society 2017; Radiology 2017; 284:228-243. 4. Stable large hiatal hernia. 5. Aortic atherosclerosis. Aortic Atherosclerosis (ICD10-I70.0). Electronically Signed   By: Darliss Cheney M.D.   On: 11/17/2022 23:15   DG Chest 2 View  Result Date: 11/17/2022 CLINICAL DATA:  Chills and fatigue EXAM: CHEST - 2 VIEW COMPARISON:  Chest radiograph 06/16/2021, PET-CT 11/13/2022 FINDINGS: The cardiomediastinal silhouette is normal. Mildly coarsened interstitial markings in both lungs similar to the prior study. There is no evidence of acute airspace opacity. There is no pulmonary edema. There is no pleural effusion or pneumothorax Left breast and axillary surgical clips are noted. There is no acute osseous abnormality. IMPRESSION: No radiographic evidence of acute cardiopulmonary process. Electronically Signed   By: Lesia Hausen M.D.   On: 11/17/2022 18:08    Pending Labs Unresulted Labs (From admission, onward)     Start     Ordered   11/18/22 0500  Cortisol-am, blood  Tomorrow morning,   R        11/17/22 2213   11/17/22 1825  SARS Coronavirus 2 by RT PCR (hospital order, performed in [facility] hospital lab) *cepheid single result test* Anterior Nasal Swab  (SARS Coronavirus 2 by RT PCR (hospital order, performed in [facility] hospital lab) *cepheid single result test*)  Once,   URGENT        11/17/22 1824   11/17/22 1528  Culture, blood (Routine x 2)  BLOOD CULTURE X 2,   R (with STAT occurrences)      11/17/22 1528   11/17/22 1528  Urinalysis, w/ Reflex to Culture (Infection Suspected) -Urine, Clean Catch  Once,   URGENT       Question:  Specimen Source  Answer:  Urine, Clean Catch    11/17/22 1528            Vitals/Pain Today's Vitals   11/18/22 0500 11/18/22 0515 11/18/22 0654 11/18/22 0700  BP: 107/82   (!) 136/95  Pulse:  99  90  Resp: (!) 22   (!) 21  Temp: 98.7 F (37.1 C)  98.4 F (36.9 C)   TempSrc: Oral  Oral   SpO2: 100%   96%  Weight:      Height:      PainSc:        Isolation Precautions Droplet precaution  Medications Medications  cefTRIAXone (ROCEPHIN) 2 g in sodium chloride 0.9 % 100 mL IVPB (0 g Intravenous Stopped 11/17/22 1907)  lactated ringers infusion ( Intravenous Restarted 11/18/22 0216)  acetaminophen (TYLENOL) tablet 650 mg (has no administration in time range)    Or  acetaminophen (TYLENOL) suppository 650 mg (has no administration in time range)  ondansetron (ZOFRAN) tablet 4 mg (has no administration in time range)    Or  ondansetron (ZOFRAN) injection 4 mg (has no administration in time range)  doxycycline (VIBRA-TABS) tablet 100 mg (has no administration in time range)  acetaminophen (TYLENOL) tablet 650 mg (has no administration in time range)  anastrozole (ARIMIDEX) tablet 1 mg (has no administration in time range)  aspirin EC tablet 81 mg (has no administration in time range)  dexamethasone (DECADRON) tablet 2 mg (has no administration in time range)  loperamide (IMODIUM) capsule 2-4 mg (4 mg Oral Given 11/18/22 0244)  loratadine (CLARITIN) tablet 10 mg (has no administration in time range)  magnesium oxide (MAG-OX) tablet 800 mg (has no administration in time range)  pantoprazole (PROTONIX) EC tablet 40 mg (has no administration in time range)  multivitamin with minerals tablet 1 tablet (has no administration in time range)  rivaroxaban (XARELTO) tablet 20 mg (has no administration in time range)  zolpidem (AMBIEN) tablet 5 mg (has no administration in time range)  vitamin B-12 (CYANOCOBALAMIN) tablet 100 mcg (has no administration in time range)  iohexol (OMNIPAQUE) 350 MG/ML injection 80 mL (80 mLs Intravenous  Contrast Given 11/17/22 2105)  doxycycline (VIBRA-TABS) tablet 100 mg (100 mg Oral Given 11/17/22 2246)  acetaminophen (TYLENOL) tablet 650 mg (650 mg Oral Given 11/17/22 2245)    Mobility walks with person assist

## 2022-11-18 NOTE — Telephone Encounter (Signed)
Pt called to let us know she is inpatient for PNA. She LVM concerned about Faslodex injection. Attempted to return call to let her know, per MD she will skip faslodex until after she is d/c from inpt. Once d/c we will need to schedule MD f/u 1 week s/p inpt.

## 2022-11-18 NOTE — ED Notes (Addendum)
Assisted Pt to [facility], O2 sats remained >93% on 3L [place].

## 2022-11-18 NOTE — Evaluation (Signed)
Physical Therapy Evaluation Patient Details Name: Stacey [name] MRN: [id number] DOB: 1940/11/08 Today's Date: 11/18/2022  History of Present Illness  Pt is 82 yo female admitted on 11/17/22 with increasing weakness, chills, and tachypnea.  Pt found to have sepsis - unknown source.  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 At baseline, pt lives alone and is independent.  She typically does adls, light iadls, and ambulates in community without AD.  Today, pt fatigues very easily and is on 4 L O2 with HR and RR increasing with minimal activity.  She required mod A STS and to step pivot to/from [facility].  She was very fatigued afterwards. Pt reports ideally would like to return home at d/c and does not want SNF.  Discussed that she is significantly below her baseline and lives alone. However, also discussed that she is normally independent and this decline has been fairly rapid (only admitted 22 hr at this time)- feel that she has good potential to improve.  Recommend Patient will benefit from intensive inpatient follow up therapy, >3 hours/day at d/c.  Pt currently with functional limitations due to the deficits listed below (see PT Problem List). Pt will benefit from acute skilled PT to increase their independence and safety with mobility to allow discharge.           If plan is discharge home, recommend the following: A lot of help with walking and/or transfers;A lot of help with bathing/dressing/bathroom   Can travel by private vehicle        Equipment Recommendations Rolling walker (2 wheels) (needs further assessment)  Recommendations for Other Services  Rehab consult    Functional Status Assessment Patient has had a recent decline in their functional status and demonstrates the ability to make significant improvements in function in a reasonable and predictable  amount of time.     Precautions / Restrictions Precautions Precautions: Fall      Mobility  Bed Mobility Overal bed mobility: Needs Assistance Bed Mobility: Supine to Sit, Sit to Supine     Supine to sit: Mod assist Sit to supine: Mod assist   General bed mobility comments: Increased time and effort; fatigued easily    Transfers Overall transfer level: Needs assistance Equipment used: 1 person hand held assist (bed rail) Transfers: Sit to/from Stand, Bed to chair/wheelchair/BSC Sit to Stand: Mod assist   Step pivot transfers: Mod assist       General transfer comment: STS from bed and BSC.  Step pivot to [facility] and back to bed with mod A and holding onto bed rail    Ambulation/Gait               General Gait Details: fatigued easily  Stairs            Wheelchair Mobility     Tilt Bed    Modified Rankin (Stroke Patients Only)       Balance Overall balance assessment: Needs assistance Sitting-balance support: No upper extremity supported Sitting balance-Leahy Scale: Good     Standing balance support: Single extremity supported, Reliant on assistive device for balance Standing balance-Leahy Scale: Poor                               Pertinent Vitals/Pain Pain Assessment Pain Assessment: No/denies pain    Home  Living Family/patient expects to be discharged to:: Private residence Living Arrangements: Alone Available Help at Discharge: Family;Available PRN/intermittently Type of Home: House Home Access: Stairs to enter   [employer] of Steps: 1 (step from sidewalk onto porch)   Home Layout: One level Home Equipment: None      Prior Function Prior Level of Function : Independent/Modified Independent;Driving             Mobility Comments: Reports could ambulate in community without AD ADLs Comments: Independent with adls and light iadls; has a housekeeper for heavy iadls     Extremity/Trunk Assessment    Upper Extremity Assessment Upper Extremity Assessment: LUE deficits/detail;RUE deficits/detail RUE Deficits / Details: Grossly WFL ; Gross MMT 4/5 LUE Deficits / Details: Pt with nighttime lymphedema sleeve in place.  She is able to remove sleeve.  Pt with decreased ROM (in shoulder and hand) and strength (throughout, grossly 3/5) on L compared to right.  Defer to OT for further details.    Lower Extremity Assessment Lower Extremity Assessment: LLE deficits/detail;RLE deficits/detail RLE Deficits / Details: ROM WFL: MMT ankle DF 5/5, knee ext 4/5, hip flex 4/5 LLE Deficits / Details: ROM WFL: MMT ankle DF 5/5, knee ext 4/5, hip flex 4/5    Cervical / Trunk Assessment Cervical / Trunk Assessment: Normal  Communication      Cognition Arousal: Alert Behavior During Therapy: WFL for tasks assessed/performed Overall Cognitive Status: Within Functional Limits for tasks assessed                                 General Comments: Slight decrease insight to deficits/ability to manage at home - pending progress        General Comments General comments (skin integrity, edema, etc.): Pt was on 4 L O2.  Her HR 100 bpm rest and up to 122 bpm with activity.  O2 sats 97% rest and 93% activity with RR up to 29 with activity.    Exercises     Assessment/Plan    PT Assessment Patient needs continued PT services  PT Problem List Decreased strength;Cardiopulmonary status limiting activity;Decreased range of motion;Decreased activity tolerance;Decreased balance;Decreased knowledge of use of DME;Decreased safety awareness       PT Treatment Interventions DME instruction;Therapeutic exercise;Gait training;Balance training;Neuromuscular re-education;Functional mobility training;Therapeutic activities;Patient/family education    PT Goals (Current goals can be found in the Care Plan section)  Acute Rehab PT Goals Patient Stated Goal: return home PT Goal Formulation: With patient Time  For Goal Achievement: 12/02/22 Potential to Achieve Goals: Good    Frequency Min 1X/week     Co-evaluation               AM-PAC PT "6 Clicks" Mobility  Outcome Measure Help needed turning from your back to your side while in a flat bed without using bedrails?: A Little Help needed moving from lying on your back to sitting on the side of a flat bed without using bedrails?: A Lot Help needed moving to and from a bed to a chair (including a wheelchair)?: A Lot Help needed standing up from a chair using your arms (e.g., wheelchair or bedside chair)?: A Lot Help needed to walk in hospital room?: Total Help needed climbing 3-5 steps with a railing? : Total 6 Click Score: 11    End of Session Equipment Utilized During Treatment: Gait belt Activity Tolerance: Patient limited by fatigue Patient left: in bed;with call bell/phone  within reach;with bed alarm set Nurse Communication: Mobility status PT Visit Diagnosis: Other abnormalities of gait and mobility (R26.89);Muscle weakness (generalized) (M62.81)    Time: 7829-5621 PT Time Calculation (min) (ACUTE ONLY): 35 min   Charges:   PT Evaluation $PT Eval Moderate Complexity: 1 Mod PT Treatments $Therapeutic Activity: 8-22 mins PT General Charges $$ ACUTE PT VISIT: 1 Visit         Anise Salvo, PT Acute Rehab [facility] Rehab [phone]   Stacey [name] 11/18/2022, 2:55 PM

## 2022-11-18 NOTE — Progress Notes (Signed)
Inpatient Rehab Admissions Coordinator:   Per therapy recommendations, patient was screened for CIR candidacy by Megan Salon, MS, CCC-SLP. At this time, Pt. does not appear to demonstrate medical necessity to justify in hospital rehabilitation/CIR. will not pursue a rehab consult for this Pt.   Recommend other rehab venues to be pursued.  Please contact me with any questions.  Megan Salon, MS, CCC-SLP Rehab Admissions Coordinator  [phone] (celll) [phone] (office)

## 2022-11-18 NOTE — ED Notes (Signed)
Pt had a hgb of 6.1 but a redraw was done d/t possible dilution and the redraw was 8.5. Attending Jeannett Senior Chiu,MD made aware.

## 2022-11-19 ENCOUNTER — Inpatient Hospital Stay: Payer: Medicare Other

## 2022-11-19 ENCOUNTER — Inpatient Hospital Stay: Payer: Medicare Other | Admitting: Hematology and Oncology

## 2022-11-19 ENCOUNTER — Inpatient Hospital Stay: Payer: Medicare Other | Attending: Adult Health

## 2022-11-19 DIAGNOSIS — J9601 Acute respiratory failure with hypoxia: Secondary | ICD-10-CM | POA: Diagnosis not present

## 2022-11-19 DIAGNOSIS — J189 Pneumonia, unspecified organism: Secondary | ICD-10-CM | POA: Diagnosis not present

## 2022-11-19 LAB — COMPREHENSIVE METABOLIC PANEL
ALT: 17 U/L (ref 0–44)
AST: 20 U/L (ref 15–41)
Albumin: 2.8 g/dL — ABNORMAL LOW (ref 3.5–5.0)
Alkaline Phosphatase: 54 U/L (ref 38–126)
Anion gap: 11 (ref 5–15)
BUN: 17 mg/dL (ref 8–23)
CO2: 18 mmol/L — ABNORMAL LOW (ref 22–32)
Calcium: 8 mg/dL — ABNORMAL LOW (ref 8.9–10.3)
Chloride: 102 mmol/L (ref 98–111)
Creatinine, Ser: 1.23 mg/dL — ABNORMAL HIGH (ref 0.44–1.00)
GFR, Estimated: 44 mL/min — ABNORMAL LOW (ref >60)
Glucose, Bld: 135 mg/dL — ABNORMAL HIGH (ref 70–99)
Potassium: 3.9 mmol/L (ref 3.5–5.1)
Sodium: 131 mmol/L — ABNORMAL LOW (ref 135–145)
Total Bilirubin: 0.4 mg/dL (ref 0.3–1.2)
Total Protein: 6.2 g/dL — ABNORMAL LOW (ref 6.5–8.1)

## 2022-11-19 LAB — CBC
HCT: 23.5 % — ABNORMAL LOW (ref 36.0–46.0)
Hemoglobin: 7.7 g/dL — ABNORMAL LOW (ref 12.0–15.0)
MCH: 35.5 pg — ABNORMAL HIGH (ref 26.0–34.0)
MCHC: 32.8 g/dL (ref 30.0–36.0)
MCV: 108.3 fL — ABNORMAL HIGH (ref 80.0–100.0)
Platelets: 127 10*3/uL — ABNORMAL LOW (ref 150–400)
RBC: 2.17 MIL/uL — ABNORMAL LOW (ref 3.87–5.11)
RDW: 14.8 % (ref 11.5–15.5)
WBC: 2.3 10*3/uL — ABNORMAL LOW (ref 4.0–10.5)
nRBC: 0 % (ref 0.0–0.2)

## 2022-11-19 MED ORDER — FLUTICASONE PROPIONATE 50 MCG/ACT NA SUSP
1.0000 | Freq: Every day | NASAL | Status: DC
  Administered 2022-11-19 – 2022-11-20 (×2): 1 via NASAL
  Filled 2022-11-19: qty 16

## 2022-11-19 MED ORDER — LACTATED RINGERS IV SOLN: INTRAVENOUS | Status: AC

## 2022-11-19 NOTE — Progress Notes (Signed)
Progress Note   Patient: Stacey [name] ZDG:[id number] DOB: November 20, 1940 DOA: 11/17/2022     2 DOS: the patient was seen and examined on 11/19/2022   Brief hospital course: 82 y.o. female with medical history significant of breast CA with bone mets, on chronic immunotherapy. Pt presented with concerns of sepsis from unclear source  Assessment and Plan: Sepsis secondary to likely pneumonia present on admit [facility]) -CT chest reviewed. Findings of patchy airspace and tree-in-bud opacities in RLL -Pt continued on empiric doxycycline with rocephin -O2 weaned to RA -recheck CBC in AM -follow up on culture results -afebrile   Tachypnea with acute hypoxemic respiratory distress Pt with severe tachypnea at rest, RR of 35-40 at presentation -CTA neg for PE, reviewed -weaning O2 as tolerated -COVD neg   Current chronic use of systemic steroids Pt on Decadron 2mg  daily chronically.   History of DVT (deep vein thrombosis) Continue Xarelto   Malignant neoplasm of upper-inner quadrant of left breast in female, estrogen receptor positive (HCC) Cancer with metastatic dz history Pt currently on Ibrance M-F, Fulvestrant Q28 days, Arimidex Cont Arimidex Cont to hold Ibrance in setting of sepsis admission Pt is followed by Dr. Al Pimple as outpatient   Pulmonary nodule 5mm pulm nodule. No follow up needed if low risk per rads read  ARF -suspect related to below chronic diarrhea -Cr up from 1.0 to 1.23 today -Start IVF  Diarrhea -chronic process, per pt -Will cont with PRN imodium   Subjective: Still reporting some sob. Weaning O2  Physical Exam: Vitals:   11/19/22 1000 11/19/22 1011 11/19/22 1100 11/19/22 1200  BP:  (!) 107/56  (!) 113/42  Pulse: 93 96 86 84  Resp: (!) 28 (!) 21 (!) 21 (!) 24  Temp:    98.2 F (36.8 C)  TempSrc:    Oral  SpO2: 93% 93% 92% 93%  Weight:      Height:       General exam: Conversant, in no acute distress Respiratory system: normal chest rise, clear, no  audible wheezing Cardiovascular system: regular rhythm, s1-s2 Gastrointestinal system: Nondistended, nontender, pos BS Central nervous system: No seizures, no tremors Extremities: No cyanosis, no joint deformities Skin: No rashes, no pallor Psychiatry: Affect normal // no auditory hallucinations   Data Reviewed:  Labs reviewed: Na 131, K 3.9, Cr 1.23, WBC 2.3, Hgb 7.7  Family Communication: Pt in room, family not at bedside  Disposition: Status is: Inpatient Remains inpatient appropriate because: Severity of illness  Planned Discharge Destination: Rehab     Author: Rickey Barbara, MD 11/19/2022 2:52 PM  For on call review [email or web address].

## 2022-11-19 NOTE — Evaluation (Signed)
Occupational Therapy Evaluation Patient Details Name: Stacey [name] MRN: [id number] DOB: 06-Oct-1940 Today's Date: 11/19/2022   History of Present Illness Pt is 82 yr old female admitted on 11/17/22 with increasing weakness, chills, and tachypnea.  Pt found to have sepsis - unknown source.  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 She was further noted to be with general deconditioning, chronic LUE edema and shoulder AROM limitations (pt reports history of lymphedema), and intermittent unsteadiness in standing. During the session today, she required assist for lower body dressing, toileting at bathroom level, and for grooming standing at the sink. She will benefit from further OT services to maximize her independence with self care tasks & to decrease the risk for further weakness and deconditioning. She prefers to return home with home therapy, once discharged from the hospital.        If plan is discharge home, recommend the following: A little help with walking and/or transfers;Assistance with cooking/housework;Help with stairs or ramp for entrance;Assist for transportation;A little help with bathing/dressing/bathroom    Functional Status Assessment  Patient has had a recent decline in their functional status and demonstrates the ability to make significant improvements in function in a reasonable and predictable amount of time.  Equipment Recommendations  Other (comment) (Rolling walker)    Recommendations for Other Services       Precautions / Restrictions Precautions Precautions: Fall Restrictions Weight Bearing Restrictions: No      Mobility Bed Mobility      General bed mobility comments: pt was received seated in the bedside  chair    Transfers Overall transfer level: Needs assistance Equipment used: Rolling walker (2 wheels) Transfers: Sit to/from Stand Sit to Stand: Contact guard assist                  Balance     Sitting balance-Leahy Scale: Good         Standing balance comment: CGA with RW         ADL either performed or assessed with clinical judgement   ADL Overall ADL's : Needs assistance/impaired Eating/Feeding: Independent;Sitting Eating/Feeding Details (indicate cue type and reason): based on clinical judgement Grooming: Contact guard assist;Standing Grooming Details (indicate cue type and reason): She performed hand washing in standing at sink level.         Upper Body Dressing : Set up;Sitting   Lower Body Dressing: Minimal assistance;Sit to/from stand Lower Body Dressing Details (indicate cue type and reason): She doffed then donned her socks in sitting with SBA, implementing the figure 4 technique to perform. She further required min assist to don underwear over her ankles in sitting, then she stood using a RW to pull underwear up over her hips. Toilet Transfer: Contact guard assist;Ambulation;Rolling walker (2 wheels);Comfort height toilet;Grab bars Toilet Transfer Details (indicate cue type and reason): She ambulated to the bathroom in her room using a RW. Toileting- Clothing Manipulation and Hygiene: Sit to/from stand;Minimal assistance Toileting - Clothing Manipulation Details (indicate cue type and reason): She performed hygiene in standing after urinating. She needed min assist for clothing management, as well as steadying assist in standing.             Vision Baseline Vision/History: 1 Wears glasses Additional Comments: She correctly  read the time depicted on the wall clock.            Pertinent Vitals/Pain Pain Assessment Pain Assessment: No/denies pain     Extremity/Trunk Assessment Upper Extremity Assessment Upper Extremity Assessment: LUE  deficits/detail;Right hand dominant RUE Deficits / Details: AROM WFL. Grip strength 4/5 LUE Deficits / Details: Chronic edema due to reported lymphedema. Chronic shoulder AROM limitations. Functional grip strength   Lower Extremity Assessment Lower Extremity Assessment: LLE deficits/detail;RLE deficits/detail RLE Deficits / Details: AROM WFL. Slight generalized weakness LLE Deficits / Details: AROM WFL. Slight generalized weakness       Communication Communication Communication: No apparent difficulties   Cognition Arousal: Alert Behavior During Therapy: WFL for tasks assessed/performed Overall Cognitive Status: Within Functional Limits for tasks assessed        General Comments: Oriented x4, able to follow 1-2 step commands without difficulty                Home Living Family/patient expects to be discharged to:: Private residence Living Arrangements: Alone Available Help at Discharge: Family;Available PRN/intermittently Type of Home: House Home Access: Stairs to enter Entrance Stairs-Number of Steps: 1   Home Layout: One level     Bathroom Shower/Tub: Tub/shower unit         Home Equipment: BSC/3in1;Shower seat          Prior Functioning/Environment Prior Level of Function : Independent/Modified Independent;Driving             Mobility Comments: She was independent with ambulation. ADLs Comments: She was independent with ADLs, she has a housekeeper who comes every 3 weeks, and she mostly prepares microwave meals.        OT Problem List: Decreased strength;Decreased activity tolerance;Impaired balance (sitting and/or standing);Decreased knowledge of use of DME or AE;Increased edema      OT Treatment/Interventions: Self-care/ADL training;Therapeutic exercise;Energy conservation;DME and/or AE instruction;Therapeutic activities;Balance training;Patient/family education    OT Goals(Current goals can be found in the care plan section) Acute Rehab OT  Goals Patient Stated Goal: to get better and return home at discharge OT Goal Formulation: With patient Time For Goal Achievement: 12/03/22 Potential to Achieve Goals: Good ADL Goals Pt Will Perform Grooming: with supervision;standing Pt Will Perform Lower Body Dressing: with supervision;sit to/from stand Pt Will Transfer to Toilet: with supervision;ambulating Pt Will Perform Toileting - Clothing Manipulation and hygiene: with supervision;sit to/from stand  OT Frequency: Min 1X/week       AM-PAC OT "6 Clicks" Daily Activity     Outcome Measure Help from another person eating meals?: None Help from another person taking care of personal grooming?: A Little Help from another person toileting, which includes using toliet, bedpan, or urinal?: A Little Help from another person bathing (including washing, rinsing, drying)?: A Lot Help from another person to put on and taking off regular upper body clothing?: A Little Help from another person to put on and taking off regular lower body clothing?: A Little 6 Click Score: 18   End of Session Equipment Utilized During Treatment: Rolling walker (2 wheels);Gait belt Nurse Communication: Mobility status  Activity Tolerance: Patient tolerated treatment well Patient left: in chair;with call bell/phone within reach  OT Visit Diagnosis: Muscle weakness (generalized) (M62.81);Unsteadiness on feet (R26.81)                Time: 7829-5621 OT Time Calculation (min): 33 min Charges:  OT General Charges $OT Visit: 1 Visit OT Evaluation $OT Eval Moderate Complexity: 1 Mod OT Treatments $Self Care/Home  Management : 8-22 mins    Reuben Likes, OTR/L 11/19/2022, 4:33 PM

## 2022-11-19 NOTE — Plan of Care (Signed)
Problem: Respiratory: Goal: Ability to maintain adequate ventilation will improve Outcome: Progressing   Problem: Safety: Goal: Ability to remain free from injury will improve Outcome: Progressing

## 2022-11-20 ENCOUNTER — Encounter (HOSPITAL_COMMUNITY): Payer: Self-pay | Admitting: Internal Medicine

## 2022-11-20 DIAGNOSIS — J9601 Acute respiratory failure with hypoxia: Secondary | ICD-10-CM | POA: Diagnosis not present

## 2022-11-20 DIAGNOSIS — J189 Pneumonia, unspecified organism: Secondary | ICD-10-CM | POA: Diagnosis not present

## 2022-11-20 LAB — COMPREHENSIVE METABOLIC PANEL
ALT: 21 U/L (ref 0–44)
AST: 25 U/L (ref 15–41)
Albumin: 3.2 g/dL — ABNORMAL LOW (ref 3.5–5.0)
Alkaline Phosphatase: 61 U/L (ref 38–126)
Anion gap: 13 (ref 5–15)
BUN: 18 mg/dL (ref 8–23)
CO2: 16 mmol/L — ABNORMAL LOW (ref 22–32)
Calcium: 8.7 mg/dL — ABNORMAL LOW (ref 8.9–10.3)
Chloride: 107 mmol/L (ref 98–111)
Creatinine, Ser: 1.19 mg/dL — ABNORMAL HIGH (ref 0.44–1.00)
GFR, Estimated: 46 mL/min — ABNORMAL LOW (ref >60)
Glucose, Bld: 118 mg/dL — ABNORMAL HIGH (ref 70–99)
Potassium: 4.2 mmol/L (ref 3.5–5.1)
Sodium: 136 mmol/L (ref 135–145)
Total Bilirubin: 0.3 mg/dL (ref 0.3–1.2)
Total Protein: 6.9 g/dL (ref 6.5–8.1)

## 2022-11-20 LAB — CBC
HCT: 32 % — ABNORMAL LOW (ref 36.0–46.0)
Hemoglobin: 9.6 g/dL — ABNORMAL LOW (ref 12.0–15.0)
MCH: 36.5 pg — ABNORMAL HIGH (ref 26.0–34.0)
MCHC: 30 g/dL (ref 30.0–36.0)
MCV: 121.7 fL — ABNORMAL HIGH (ref 80.0–100.0)
Platelets: 200 10*3/uL (ref 150–400)
RBC: 2.63 MIL/uL — ABNORMAL LOW (ref 3.87–5.11)
RDW: 14.9 % (ref 11.5–15.5)
WBC: 4.2 10*3/uL (ref 4.0–10.5)
nRBC: 0 % (ref 0.0–0.2)

## 2022-11-20 MED ORDER — DOXYCYCLINE HYCLATE 100 MG PO TABS: 100.0000 mg | ORAL_TABLET | Freq: Two times a day (BID) | ORAL | 0 refills | Status: AC

## 2022-11-20 MED ORDER — CEFDINIR 300 MG PO CAPS: 300.0000 mg | ORAL_CAPSULE | Freq: Two times a day (BID) | ORAL | 0 refills | Status: AC

## 2022-11-20 NOTE — Progress Notes (Signed)
Physical Therapy Treatment Patient Details Name: Stacey [name] MRN: [id number] DOB: [birth year] Today's Date: 11/20/2022   History of Present Illness Pt is 82 yo female admitted on 11/17/22 with increasing weakness, chills, and tachypnea.  Pt found to have sepsis - unknown source.  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 Pt amb 200 ft with RW, step through gait pattern, initially unsteady and reporting unfamiliar with RW but improves with distance, able to complete turns and navigate around obstacles. Once returned to room, pt demo good LAQ and seated marching, encouraged to perform multiple sets/reps throughout the day and pt verbalizes understanding. Discussed pt amb with nursing to restroom and in hallway with nursing as able and pt verbalized agreement. Updated d/c rec to HHPT, pt reports has family and friends able to check on her and assist as needed.   If plan is discharge home, recommend the following: Assistance with cooking/housework;Help with stairs or ramp for entrance   Can travel by private vehicle        Equipment Recommendations  Rolling walker (2 wheels)    Recommendations for Other Services       Precautions / Restrictions Precautions Precautions: Fall Restrictions Weight Bearing Restrictions: No     Mobility  Bed Mobility Overal bed mobility: Modified Independent Bed Mobility: Supine to Sit           General bed mobility comments: no physical assist    Transfers Overall transfer level: Needs assistance Equipment used: Rolling walker (2 wheels) Transfers: Sit to/from Stand Sit to Stand: Supervision           General transfer comment: verbal cues for hand  placement, pt able to power up from EOB and recliner with BUE assisting by pushing from seated surface and no physical assistance    Ambulation/Gait Ambulation/Gait assistance: Contact guard assist Gait Distance (Feet): 200 Feet Assistive device: Rolling walker (2 wheels) Gait Pattern/deviations: Step-through pattern, Decreased stride length Gait velocity: decreased     General Gait Details: step through gait pattern, silghtly unsteady initially with unfamiliarity to RW but improves with distance, no overt LOB, able to complete 180 degree turns and navigate room set up   Stairs             Wheelchair Mobility     Tilt Bed    Modified Rankin (Stroke Patients Only)       Balance Overall balance assessment: Mild deficits observed, not formally tested                                          Cognition Arousal: Alert Behavior During Therapy: WFL for tasks assessed/performed Overall Cognitive Status: Within Functional Limits for tasks assessed                                 General Comments: pt a&o x4, pleasant, talkative, follows commands        Exercises General Exercises - Lower Extremity Long Arc Quad: AROM, Strengthening, Both, 5 reps, Seated Hip Flexion/Marching: AROM, Strengthening, Both, 5 reps, Seated  General Comments        Pertinent Vitals/Pain Pain Assessment Pain Assessment: No/denies pain    Home Living                          Prior Function            PT Goals (current goals can now be found in the care plan section) Acute Rehab PT Goals Patient Stated Goal: return home PT Goal Formulation: With patient Time For Goal Achievement: 12/02/22 Potential to Achieve Goals: Good Progress towards PT goals: Progressing toward goals    Frequency    Min 1X/week      PT Plan      Co-evaluation              AM-PAC PT "6 Clicks" Mobility   Outcome Measure  Help needed turning from  your back to your side while in a flat bed without using bedrails?: None Help needed moving from lying on your back to sitting on the side of a flat bed without using bedrails?: None Help needed moving to and from a bed to a chair (including a wheelchair)?: A Little Help needed standing up from a chair using your arms (e.g., wheelchair or bedside chair)?: A Little Help needed to walk in hospital room?: A Little Help needed climbing 3-5 steps with a railing? : A Little 6 Click Score: 20    End of Session Equipment Utilized During Treatment: Gait belt Activity Tolerance: Patient tolerated treatment well Patient left: in chair;with call bell/phone within reach Nurse Communication: Mobility status PT Visit Diagnosis: Other abnormalities of gait and mobility (R26.89);Muscle weakness (generalized) (M62.81)     Time: 4401-0272 PT Time Calculation (min) (ACUTE ONLY): 50 min  Charges:    $Gait Training: 8-22 mins $Therapeutic Exercise: 8-22 mins $Therapeutic Activity: 8-22 mins PT General Charges $$ ACUTE PT VISIT: 1 Visit                     Tori Silvie Obremski PT, DPT 11/20/22, 12:39 PM

## 2022-11-20 NOTE — Clinical Note (Incomplete)
ED RNCM  contacted by RN caring for patient, concerning assistance with discharge DME. Patient awaiting DME for discharge. Explained that we are unable to assist with obtaining DME after hours. We can place order with DME company but it will not be processed until tomorrow and be delivered to patient's home.

## 2022-11-20 NOTE — TOC Initial Note (Addendum)
Transition of Care [facility]) - Initial/Assessment Note    Patient Details  Name: Stacey [name] MRN: [id number] Date of Birth: Oct 23, 1940  Transition of Care [facility]) CM/SW Contact:    Harriett Sine, RN Phone Number: 11/20/2022, 12:29 PM  Clinical Narrative:                 Pt from home with family, pt has pcp, Spoke with pt at bedside about Acute Inpt Rehab, and d/c plans. Pt states she will speak with her family. No SDOH needs at this time, [facility] following Called daughter [name] [phone] left messge    Expected Discharge Plan: Home w Home Health Services Barriers to Discharge: No Barriers Identified   Patient Goals and CMS Choice Patient states their goals for this hospitalization and ongoing recovery are:: none stated CMS Medicare.gov Compare Post Acute Care list provided to:: Patient Choice offered to / list presented to : Patient, Adult Children      Expected Discharge Plan and Services In-house Referral: NA (none) Discharge Planning Services:  (none) Post Acute Care Choice:  (no response yet from family) Living arrangements for the past 2 months: Single Family Home                                      Prior Living Arrangements/Services Living arrangements for the past 2 months: Single Family Home Lives with:: Adult Children Patient language and need for interpreter reviewed:: Yes Do you feel safe going back to the place where you live?: Yes      Need for Family Participation in Patient Care: Yes (Comment) Care giver support system in place?: Yes (comment) Current home services: DME (roller walker)    Activities of Daily Living   ADL Screening (condition at time of admission) Independently performs ADLs?: No (too weak lately) Does the patient have a NEW difficulty with bathing/dressing/toileting/self-feeding that is expected to last >3 days?: Yes (Initiates electronic notice to provider for possible OT consult) Does the patient have a NEW difficulty with  getting in/out of bed, walking, or climbing stairs that is expected to last >3 days?: Yes (Initiates electronic notice to provider for possible PT consult) Does the patient have a NEW difficulty with communication that is expected to last >3 days?: No Is the patient deaf or have difficulty hearing?: No Does the patient have difficulty seeing, even when wearing glasses/contacts?: Yes (just reading) Does the patient have difficulty concentrating, remembering, or making decisions?: No  Permission Sought/Granted Permission sought to share information with : Facility Medical sales representative, Family Supports Permission granted to share information with : Yes, Verbal Permission Granted  Share Information with NAME: Smith,[name] (Daughter)  [phone] (Mobile)           Emotional Assessment Appearance:: Appears stated age Attitude/Demeanor/Rapport: Engaged Affect (typically observed): Accepting, Calm Orientation: : Oriented to Self, Oriented to Place, Oriented to  Time, Oriented to Situation Alcohol / Substance Use: Alcohol Use Psych Involvement: No (comment)  Admission diagnosis:  Sepsis (HCC) [A41.9] Acute hypoxemic respiratory failure (HCC) [J96.01] Community acquired pneumonia, unspecified laterality [J18.9] Patient Active Problem List   Diagnosis Date Noted   Sepsis (HCC) 11/17/2022   History of DVT (deep vein thrombosis) 11/17/2022   Current chronic use of systemic steroids 11/17/2022   Tachypnea 11/17/2022   Pulmonary nodule 11/17/2022   Acute deep vein thrombosis (DVT) of femoral vein of right lower extremity (HCC) 04/13/2022   Acute upper  respiratory infection 05/16/2021   Nasal dryness 02/04/2021   Chronic sinusitis 11/25/2020   Allergic rhinitis 07/08/2020   Hypomagnesemia 06/10/2020   Goals of care, counseling/discussion 06/21/2019   Family history of breast cancer    Family history of pancreatic cancer    Family history of uterine cancer    Family history of lymphoma     Family history of lung cancer    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HCC) 06/02/2019   Recurrent cancer of left breast (HCC) 05/15/2019   Pes anserine bursitis 03/31/2019   HTN (hypertension) 02/10/2019   Hyperglycemia 06/21/2018   Left carpal tunnel syndrome 02/22/2018   Rotator cuff arthropathy of left shoulder 09/20/2017   Arthritis of hand 08/23/2017   Pain in right hand 08/23/2017   Cervical radiculitis 04/09/2017   Left shoulder pain 04/09/2017   Dyspnea on exertion 05/14/2016   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   Lymphedema of left arm 01/14/2016   Left arm swelling 12/25/2015   SVT (supraventricular tachycardia) (HCC)    Left-sided carotid artery disease (HCC) 03/08/2015   Dizziness 01/24/2015   Urinary frequency 01/13/2015   Dizziness and giddiness 01/09/2015   Gallstones 02/21/2013   Malignant neoplasm of upper-inner quadrant of left breast in female, estrogen receptor positive (HCC) 11/28/2012   Muscle cramping 09/12/2012   Right hip pain 09/12/2012   Lower back pain 09/12/2012   COPD GOLD I     Hx of radiation therapy    Right shoulder pain 09/14/2011   Preventative health care 07/29/2010   Skin lesion of left leg 07/29/2010   Paresthesia 07/29/2010   GERD 03/28/2010   Constipation 03/28/2010   Osteoarthrosis, 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   Anxiety state 05/03/2009   Hyperlipidemia 06/14/2007   FATIGUE 06/14/2007   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 [place] [address] Phone: [phone] Fax: [phone]     Social Determinants of Health  (SDOH) Social History: SDOH Screenings   Food Insecurity: No Food Insecurity (11/18/2022)  Housing: Low Risk  (11/18/2022)  Transportation Needs: No Transportation Needs (11/18/2022)  Utilities: Not At Risk (11/18/2022)  Alcohol Screen: Low Risk  (11/07/2022)  Depression (PHQ2-9): Low Risk  (11/10/2022)  Financial Resource Strain: Low Risk  (11/07/2022)  Physical Activity: Inactive (11/07/2022)  Social Connections: Socially Isolated (11/07/2022)  Stress: No Stress Concern Present (11/07/2022)  Tobacco Use: Medium Risk (11/10/2022)  Health Literacy: Adequate Health Literacy (11/10/2022)   SDOH Interventions:     Readmission Risk Interventions    11/19/2022    1:54 PM  Readmission Risk Prevention Plan  Transportation Screening Complete  PCP or Specialist Appt within 5-7 Days Complete  Home Care Screening Complete  Medication Review (RN CM) Complete

## 2022-11-20 NOTE — Progress Notes (Addendum)
Pt will go home and TOC team will send DME out to the house tomorrow- [facility] & walker. HH will be set up through [facility], per Rosezena Sensor. Message was left with [facility] by Caroleen Hamman. No other questions at this time - Pt home w/ daughter- to main entrance via w/c. Pt requested a raised toilet seat instead of a BSC. Dr Rhona Leavens & [facility] team updated by this RN

## 2022-11-20 NOTE — Discharge Summary (Signed)
Physician Discharge Summary   Patient: Stacey [name] MRN: [id number] DOB: 04-Oct-1940  Admit date:     11/17/2022  Discharge date: 11/20/22  Discharge Physician: Rickey Barbara   PCP: [name], MD   Recommendations at discharge:    Follow up with PCP in 1-2 weeks Follow up with Oncology as scheduled  Discharge Diagnoses: Principal Problem:   Sepsis (HCC) Active Problems:   Tachypnea   Malignant neoplasm of upper-inner quadrant of left breast in female, estrogen receptor positive (HCC)   History of DVT (deep vein thrombosis)   Current chronic use of systemic steroids   Pulmonary nodule  Resolved Problems:   * No resolved hospital problems. *  Hospital Course: 82 y.o. female with medical history significant of breast CA with bone mets, on chronic immunotherapy. Pt presented with concerns of sepsis from unclear source  Assessment and Plan: Sepsis secondary to likely pneumonia present on admit [facility]) -CT chest reviewed. Findings of patchy airspace and tree-in-bud opacities in RLL -Pt continued on empiric doxycycline with rocephin -O2 weaned to RA -WBC normalized -patient remained afebrile -Plan to complete course of omnicef and doxy on d/c   Tachypnea with acute hypoxemic respiratory distress Pt with severe tachypnea at rest, RR of 35-40 at presentation -CTA neg for PE, reviewed -O2 weaned to room air -COVD neg -Resolved   Current chronic use of systemic steroids Pt on Decadron 2mg  daily chronically.   History of DVT (deep vein thrombosis) Continue Xarelto   Malignant neoplasm of upper-inner quadrant of left breast in female, estrogen receptor positive (HCC) Cancer with metastatic dz history Pt currently on Ibrance M-F, Fulvestrant Q28 days, Arimidex Cont Arimidex Held [place] while in hospital, resume as outpt Pt is followed by Dr. Al Pimple as outpatient   Pulmonary nodule 5mm pulm nodule. No follow up needed if low risk per rads read   ARF -suspect related  to below chronic diarrhea -Cr peaked from 1.0 to 1.23. Improved with IVF -Given hx of chronic diarrhea and concerns of mild dehydration, recommend holding further diuretics   Diarrhea -chronic process, per pt -continued with PRN imodium  HTN -BP remained stable this visit -Recommend resuming home meds with the exception of diuretics given concerns of mild dehydration in the setting of chronic diarrhea    Consultants:  Procedures performed:   Disposition: Home Diet recommendation:  Cardiac diet DISCHARGE MEDICATION: Allergies as of 11/20/2022       Reactions   Aminoglycosides Other (See Comments)   Unknown reaction - pt is not sure where this entry came from   Bacitracin Other (See Comments)   Bee Venom Swelling   Severe body swelling   Cephalexin Other (See Comments)   Clindamycin/lincomycin Diarrhea, Nausea And Vomiting   Codeine Nausea And Vomiting   Pt can take codeine cough syrup.        Medication List     STOP taking these medications    triamterene-hydrochlorothiazide 37.5-25 MG capsule Commonly known as: DYAZIDE       TAKE these medications    acetaminophen 500 MG tablet Commonly known as: TYLENOL Take 1,000 mg by mouth See admin instructions. Take 2 tablets (1000 mg) by mouth daily at bedtime, may also take 2 tablets (1000 mg) two more times during the day or night as needed for pain   anastrozole 1 MG tablet Commonly known as: ARIMIDEX TAKE 1 TABLET(1 MG) BY MOUTH DAILY   aspirin EC 81 MG tablet Take 81 mg by mouth every morning.  CALCIUM CARBONATE-VITAMIN D PO Take 1 tablet by mouth 2 (two) times daily.   cefdinir 300 MG capsule Commonly known as: OMNICEF Take 1 capsule (300 mg total) by mouth 2 (two) times daily for 3 days. Start taking on: November 21, 2022   dexamethasone 4 MG tablet Commonly known as: DECADRON Take 0.5 tablets (2 mg total) by mouth daily.   diphenhydrAMINE 25 MG tablet Commonly known as: BENADRYL Take 25 mg by  mouth every 6 (six) hours as needed (for bee stings).   doxycycline 100 MG tablet Commonly known as: VIBRA-TABS Take 1 tablet (100 mg total) by mouth every 12 (twelve) hours for 3 days. Start taking on: November 21, 2022   fulvestrant 250 MG/5ML injection Commonly known as: FASLODEX Inject 250 mg into the muscle every 28 (twenty-eight) days.   Ibrance 100 MG tablet Generic drug: palbociclib Take 1 tablet (100 mg total) by mouth Monday through Friday. Take as directed by MD.   loperamide 2 MG capsule Commonly known as: IMODIUM 2-4 mg See admin instructions. Take 2 tablets (4 mg) by mouth for first loose stool, then 1 tablet (2 mg) for every loose stool thereafter. Do not exceed 8 tablets (16 mg) in a 24 hour period. Stop loperamide if 12 hours have passed without a loose stool.   loratadine 10 MG tablet Commonly known as: CLARITIN Take 10 mg by mouth every morning.   Magnesium 500 MG Caps Take 500 mg by mouth every morning. Take with a 250 mg capsule for a total dose of 750 mg daily   MAGNESIUM PO Take 250 mg by mouth every morning. Take with a 500 mg capsule for a total dose of 750 mg daily   multivitamin with minerals Tabs tablet Take 1 tablet by mouth every morning. Centrum   omeprazole 20 MG capsule Commonly known as: PRILOSEC Take 1 capsule by mouth daily   prochlorperazine 10 MG tablet Commonly known as: COMPAZINE Take 1 tablet (10 mg total) by mouth every 6 (six) hours as needed for nausea or vomiting.   rivaroxaban 20 MG Tabs tablet Commonly known as: XARELTO Take 1 tablet (20 mg total) by mouth daily with supper.   telmisartan 40 MG tablet Commonly known as: MICARDIS Take 1 tablet (40 mg total) by mouth every morning.   triamcinolone 55 MCG/ACT Aero nasal inhaler Commonly known as: NASACORT Place 2 sprays into the nose daily. 2 sprays each nostril at night before bedtime What changed:  when to take this additional instructions   vitamin B-12 100 MCG  tablet Commonly known as: CYANOCOBALAMIN Take 100 mcg by mouth daily.   VITAMIN D3 PO Take 1 capsule by mouth every morning.   zolpidem 10 MG tablet Commonly known as: AMBIEN Take 1 tablet (10 mg total) by mouth at bedtime as needed for sleep.               Durable Medical Equipment  (From admission, onward)           Start     Ordered   11/20/22 1412  For home use only DME Walker rolling  Once       Question Answer Comment  Walker: With 5 Inch Wheels   Patient needs a walker to treat with the following condition Pneumonia      11/20/22 1411            Follow-up Information     [name], MD Follow up in 2 week(s).   Specialties: Internal Medicine,  Radiology Why: Hospital follow up Contact information: [address] [place] [address] [phone]                Discharge Exam: Ceasar Mons Weights   11/17/22 1526  Weight: 62.6 kg   General exam: Awake, laying in bed, in nad Respiratory system: Normal respiratory effort, no wheezing Cardiovascular system: regular rate, s1, s2 Gastrointestinal system: Soft, nondistended, positive BS Central nervous system: CN2-12 grossly intact, strength intact Extremities: Perfused, no clubbing Skin: Normal skin turgor, no notable skin lesions seen Psychiatry: Mood normal // no visual hallucinations   Condition at discharge: fair  The results of significant diagnostics from this hospitalization (including imaging, microbiology, ancillary and laboratory) are listed below for reference.   Imaging Studies: CT Angio Chest PE W and/or Wo Contrast  Result Date: 11/17/2022 CLINICAL DATA:  Shortness of breath and fatigue. History of metastatic breast cancer. EXAM: CT ANGIOGRAPHY CHEST WITH CONTRAST TECHNIQUE: Multidetector CT imaging of the chest was performed using the standard protocol during bolus administration of intravenous contrast. Multiplanar CT image reconstructions and MIPs were obtained to  evaluate the vascular anatomy. RADIATION DOSE REDUCTION: This exam was performed according to the departmental dose-optimization program which includes automated exposure control, adjustment of the mA and/or kV according to patient size and/or use of iterative reconstruction technique. CONTRAST:  80mL OMNIPAQUE IOHEXOL 350 MG/ML SOLN COMPARISON:  CT angiogram chest 06/16/2021 FINDINGS: Cardiovascular: Satisfactory opacification of the pulmonary arteries to the segmental level. No evidence of pulmonary embolism. Normal heart size. No pericardial effusion. The main pulmonary artery is enlarged compatible with pulmonary artery hypertension. There are atherosclerotic calcifications of the aorta. Mediastinum/Nodes: No enlarged mediastinal, hilar, or axillary lymph nodes. Thyroid gland, trachea, and esophagus demonstrate no significant findings. There is a large hiatal hernia, unchanged. Lungs/Pleura: Moderate emphysematous changes are again noted. Postradiation changes in the anterior right upper lobe appear unchanged. Right upper lobe nodule measuring 5 mm image 2/73 appears unchanged. There is dependent atelectasis bilaterally. Minimal patchy airspace and tree-in-bud opacities in the right lower lobe appear similar to prior. There is no pleural effusion or pneumothorax. There is scarring in both lung apices. Upper Abdomen: Left hepatic cysts measuring up to 2.2 cm appear similar to prior. Musculoskeletal: Focal density in the right breast with surrounding surgical clips appears unchanged measuring 1.4 x 3.0 cm. Left breast surgical clips and calcifications are again seen. No acute fractures are identified. Review of the MIP images confirms the above findings. IMPRESSION: 1. No evidence for pulmonary embolism. 2. Stable patchy airspace and tree-in-bud opacities in the right lower lobe, likely infectious/inflammatory. 3. Stable 5 mm right upper lobe pulmonary nodule. No follow-up needed if patient is low-risk.This  recommendation follows the consensus statement: Guidelines for Management of Incidental Pulmonary Nodules Detected on CT Images: From the Fleischner Society 2017; Radiology 2017; 284:228-243. 4. Stable large hiatal hernia. 5. Aortic atherosclerosis. Aortic Atherosclerosis (ICD10-I70.0). Electronically Signed   By: Darliss Cheney M.D.   On: 11/17/2022 23:15   DG Chest 2 View  Result Date: 11/17/2022 CLINICAL DATA:  Chills and fatigue EXAM: CHEST - 2 VIEW COMPARISON:  Chest radiograph 06/16/2021, PET-CT 11/13/2022 FINDINGS: The cardiomediastinal silhouette is normal. Mildly coarsened interstitial markings in both lungs similar to the prior study. There is no evidence of acute airspace opacity. There is no pulmonary edema. There is no pleural effusion or pneumothorax Left breast and axillary surgical clips are noted. There is no acute osseous abnormality. IMPRESSION: No radiographic evidence of acute cardiopulmonary process. Electronically  Signed   By: Lesia Hausen M.D.   On: 11/17/2022 18:08    Microbiology: Results for orders placed or performed during the hospital encounter of 11/17/22  Culture, blood (Routine x 2)     Status: None (Preliminary result)   Collection Time: 11/17/22  4:00 PM   Specimen: BLOOD  Result Value Ref Range Status   Specimen Description   Final    BLOOD RIGHT ANTECUBITAL Performed at [facility], [address], [place], [place] [address]    Special Requests   Final    BOTTLES DRAWN AEROBIC AND ANAEROBIC Blood Culture adequate volume Performed at [facility], [address] [place], [place] [address]    Culture   Final    NO GROWTH 3 DAYS Performed at [facility], [address], [place], [place] [address]    Report Status PENDING  Incomplete  Resp panel by RT-PCR (RSV, Flu A&B, Covid) Anterior Nasal Swab     Status: None   Collection Time: 11/17/22  5:41 PM   Specimen: Anterior Nasal Swab  Result Value Ref Range  Status   SARS Coronavirus 2 by RT PCR NEGATIVE NEGATIVE Final    Comment: (NOTE) SARS-CoV-2 target nucleic acids are NOT DETECTED.  The SARS-CoV-2 RNA is generally detectable in upper respiratory specimens during the acute phase of infection. The lowest concentration of SARS-CoV-2 viral copies this assay can detect is 138 copies/mL. A negative result does not preclude SARS-Cov-2 infection and should not be used as the sole basis for treatment or other patient management decisions. A negative result may occur with  improper specimen collection/handling, submission of specimen other than nasopharyngeal swab, presence of viral mutation(s) within the areas targeted by this assay, and inadequate number of viral copies(<138 copies/mL). A negative result must be combined with clinical observations, patient history, and epidemiological information. The expected result is Negative.  Fact Sheet for Patients:  BloggerCourse.com  Fact Sheet for Healthcare Providers:  SeriousBroker.it  This test is no t yet approved or cleared by the [place] FDA and  has been authorized for detection and/or diagnosis of SARS-CoV-2 by FDA under an Emergency Use Authorization (EUA). This EUA will remain  in effect (meaning this test can be used) for the duration of the COVID-19 declaration under Section 564(b)(1) of the Act, 21 U.S.C.section 360bbb-3(b)(1), unless the authorization is terminated  or revoked sooner.       Influenza A by PCR NEGATIVE NEGATIVE Final   Influenza B by PCR NEGATIVE NEGATIVE Final    Comment: (NOTE) The Xpert Xpress SARS-CoV-2/FLU/RSV plus assay is intended as an aid in the diagnosis of influenza from Nasopharyngeal swab specimens and should not be used as a sole basis for treatment. Nasal washings and aspirates are unacceptable for Xpert Xpress SARS-CoV-2/FLU/RSV testing.  Fact Sheet for  Patients: BloggerCourse.com  Fact Sheet for Healthcare Providers: SeriousBroker.it  This test is not yet approved or cleared by the [place] FDA and has been authorized for detection and/or diagnosis of SARS-CoV-2 by FDA under an Emergency Use Authorization (EUA). This EUA will remain in effect (meaning this test can be used) for the duration of the COVID-19 declaration under Section 564(b)(1) of the Act, 21 U.S.C. section 360bbb-3(b)(1), unless the authorization is terminated or revoked.     Resp Syncytial Virus by PCR NEGATIVE NEGATIVE Final    Comment: (NOTE) Fact Sheet for Patients: BloggerCourse.com  Fact Sheet for Healthcare Providers: SeriousBroker.it  This test is not yet approved  or cleared by the [place] and has been authorized for detection and/or diagnosis of SARS-CoV-2 by FDA under an Emergency Use Authorization (EUA). This EUA will remain in effect (meaning this test can be used) for the duration of the COVID-19 declaration under Section 564(b)(1) of the Act, 21 U.S.C. section 360bbb-3(b)(1), unless the authorization is terminated or revoked.  Performed at [facility], [address], [place], [place] [address]   Respiratory (~20 pathogens) panel by PCR     Status: None   Collection Time: 11/17/22 10:49 PM   Specimen: Nasopharyngeal Swab; Respiratory  Result Value Ref Range Status   Adenovirus NOT DETECTED NOT DETECTED Final   Coronavirus 229E NOT DETECTED NOT DETECTED Final    Comment: (NOTE) The Coronavirus on the Respiratory Panel, DOES NOT test for the novel  Coronavirus (2019 nCoV)    Coronavirus HKU1 NOT DETECTED NOT DETECTED Final   Coronavirus NL63 NOT DETECTED NOT DETECTED Final   Coronavirus OC43 NOT DETECTED NOT DETECTED Final   Metapneumovirus NOT DETECTED NOT DETECTED Final   Rhinovirus / Enterovirus NOT  DETECTED NOT DETECTED Final   Influenza A NOT DETECTED NOT DETECTED Final   Influenza B NOT DETECTED NOT DETECTED Final   Parainfluenza Virus 1 NOT DETECTED NOT DETECTED Final   Parainfluenza Virus 2 NOT DETECTED NOT DETECTED Final   Parainfluenza Virus 3 NOT DETECTED NOT DETECTED Final   Parainfluenza Virus 4 NOT DETECTED NOT DETECTED Final   Respiratory Syncytial Virus NOT DETECTED NOT DETECTED Final   Bordetella pertussis NOT DETECTED NOT DETECTED Final   Bordetella Parapertussis NOT DETECTED NOT DETECTED Final   Chlamydophila pneumoniae NOT DETECTED NOT DETECTED Final   Mycoplasma pneumoniae NOT DETECTED NOT DETECTED Final    Comment: Performed at [facility], [address], [place], [place] [address]  MRSA Next Gen by PCR, Nasal     Status: None   Collection Time: 11/18/22  8:46 AM   Specimen: Nasal Mucosa; Nasal Swab  Result Value Ref Range Status   MRSA by PCR Next Gen NOT DETECTED NOT DETECTED Final    Comment: (NOTE) The GeneXpert MRSA Assay (FDA approved for NASAL specimens only), is one component of a comprehensive MRSA colonization surveillance program. It is not intended to diagnose MRSA infection nor to guide or monitor treatment for MRSA infections. Test performance is not FDA approved in patients less than 79 years old. Performed at [facility], [address] [place], [place] [address]   Culture, blood (Routine x 2)     Status: None (Preliminary result)   Collection Time: 11/18/22  9:02 AM   Specimen: BLOOD RIGHT ARM  Result Value Ref Range Status   Specimen Description   Final    BLOOD RIGHT ARM Performed at [facility], [address], [place], [place] [address]    Special Requests   Final    BOTTLES DRAWN AEROBIC AND ANAEROBIC Blood Culture adequate volume Performed at [facility], [address], [place], [place] [address]    Culture   Final    NO GROWTH 2 DAYS Performed at [facility],  [address] [place], [place] [address]    Report Status PENDING  Incomplete   *Note: Due to a large number of results and/or encounters for the requested time period, some results have not been displayed. A complete set of results can be found in Results Review.    Labs: CBC: Recent Labs  Lab 11/17/22 1600 11/18/22 0606 11/19/22 0241 11/20/22 0407  WBC 3.0*  --  2.3* 4.2  NEUTROABS 2.5  --   --   --   HGB 9.7* 8.5* 7.7* 9.6*  HCT 29.6* 26.4* 23.5* 32.0*  MCV 108.0*  --  108.3* 121.7*  PLT 110*  --  127* 200   Basic Metabolic Panel: Recent Labs  Lab 11/17/22 1600 11/17/22 2249 11/18/22 0455 11/19/22 0241 11/20/22 0407  NA 137  --  135 131* 136  K 4.2  --  4.2 3.9 4.2  CL 106  --  104 102 107  CO2 19*  --  15* 18* 16*  GLUCOSE 121*  --  93 135* 118*  BUN 26*  --  18 17 18   CREATININE 1.54*  --  1.09* 1.23* 1.19*  CALCIUM 8.3*  --  7.8* 8.0* 8.7*  MG  --  1.2*  --   --   --    Liver Function Tests: Recent Labs  Lab 11/17/22 1600 11/18/22 0455 11/19/22 0241 11/20/22 0407  AST 28 33 20 25  ALT 17 14 17 21   ALKPHOS 61 46 54 61  BILITOT 0.7 1.0 0.4 0.3  PROT 7.3 5.4* 6.2* 6.9  ALBUMIN 3.3* 2.4* 2.8* 3.2*   CBG: No results for input(s): "GLUCAP" in the last 168 hours.  Discharge time spent: less than 30 minutes.  Signed: Rickey Barbara, MD [facility] 11/20/2022

## 2022-11-20 NOTE — Plan of Care (Addendum)
Patient shows no signs or symptoms of acute distress at the time of discharge.  Problem: Fluid Volume: Goal: Hemodynamic stability will improve Outcome: Adequate for Discharge   Problem: Clinical Measurements: Goal: Diagnostic test results will improve Outcome: Adequate for Discharge Goal: Signs and symptoms of infection will decrease Outcome: Adequate for Discharge   Problem: Respiratory: Goal: Ability to maintain adequate ventilation will improve Outcome: Adequate for Discharge   Problem: Education: Goal: Knowledge of General Education information will improve Description: Including pain rating scale, medication(s)/side effects and non-pharmacologic comfort measures Outcome: Adequate for Discharge   Problem: Health Behavior/Discharge Planning: Goal: Ability to manage health-related needs will improve Outcome: Adequate for Discharge   Problem: Clinical Measurements: Goal: Ability to maintain clinical measurements within normal limits will improve Outcome: Adequate for Discharge Goal: Will remain free from infection Outcome: Adequate for Discharge Goal: Diagnostic test results will improve Outcome: Adequate for Discharge Goal: Respiratory complications will improve Outcome: Adequate for Discharge Goal: Cardiovascular complication will be avoided Outcome: Adequate for Discharge   Problem: Activity: Goal: Risk for activity intolerance will decrease Outcome: Adequate for Discharge   Problem: Nutrition: Goal: Adequate nutrition will be maintained Outcome: Adequate for Discharge   Problem: Coping: Goal: Level of anxiety will decrease Outcome: Adequate for Discharge   Problem: Elimination: Goal: Will not experience complications related to bowel motility Outcome: Adequate for Discharge Goal: Will not experience complications related to urinary retention Outcome: Adequate for Discharge   Problem: Pain Managment: Goal: General experience of comfort will  improve Outcome: Adequate for Discharge   Problem: Safety: Goal: Ability to remain free from injury will improve Outcome: Adequate for Discharge   Problem: Skin Integrity: Goal: Risk for impaired skin integrity will decrease Outcome: Adequate for Discharge

## 2022-11-20 NOTE — TOC Transition Note (Signed)
Transition of Care [facility]) - CM/SW Discharge Note   Patient Details  Name: Stacey [name] MRN: [id number] Date of Birth: May 07, 1940  Transition of Care [facility]) CM/SW Contact:  Harriett Sine, RN Phone Number:[phone]  11/20/2022, 3:22 PM   Clinical Narrative:    Pt from home , d/c home with HHPT/OT. Spoke with pt at bedside and daughter on the phone about d/c plans and [facility] agency choice, list given. Pt referred to medicare.gov for more details.   Pt and daughter [name] choose [facility] for PT/OT services. Lennox Laity with [facility] to confirm pt acceptance. Pt and daughter will arrange transportation as soon as [name] can get a vehicle.   Final next level of care: Home w Home Health Services Barriers to Discharge: No Barriers Identified   Patient Goals and CMS Choice CMS Medicare.gov Compare Post Acute Care list provided to:: Patient Choice offered to / list presented to : Patient, Adult Children  Discharge Placement                  Patient to be transferred to facility by: private auto Name of family member notified: daughter laura Patient and family notified of of transfer: 11/20/22  Discharge Plan and Services Additional resources added to the After Visit Summary for   In-house Referral: NA (none) Discharge Planning Services:  (none) Post Acute Care Choice:  (no response yet from family)          DME Arranged:  (NA)         HH Arranged: PT, OT HH Agency: [facility] Date HH Agency Contacted: 11/20/22 Time HH Agency Contacted: 1521 Representative spoke with at [facility] Agency: left message  Social Determinants of Health (SDOH) Interventions SDOH Screenings   Food Insecurity: No Food Insecurity (11/18/2022)  Housing: Low Risk  (11/18/2022)  Transportation Needs: No Transportation Needs (11/18/2022)  Utilities: Not At Risk (11/18/2022)  Alcohol Screen: Low Risk  (11/07/2022)  Depression (PHQ2-9): Low Risk  (11/10/2022)  Financial Resource  Strain: Low Risk  (11/07/2022)  Physical Activity: Inactive (11/07/2022)  Social Connections: Socially Isolated (11/07/2022)  Stress: No Stress Concern Present (11/07/2022)  Tobacco Use: Medium Risk (11/10/2022)  Health Literacy: Adequate Health Literacy (11/10/2022)     Readmission Risk Interventions    11/19/2022    1:54 PM  Readmission Risk Prevention Plan  Transportation Screening Complete  PCP or Specialist Appt within 5-7 Days Complete  Home Care Screening Complete  Medication Review (RN CM) Complete

## 2022-11-20 NOTE — Plan of Care (Signed)
[facility] Tonsillectomy/Adenoidectomy, Postoperative PEDS care plan entered in error.

## 2022-11-22 DIAGNOSIS — I471 Supraventricular tachycardia, unspecified: Secondary | ICD-10-CM | POA: Diagnosis not present

## 2022-11-22 DIAGNOSIS — Z7901 Long term (current) use of anticoagulants: Secondary | ICD-10-CM | POA: Diagnosis not present

## 2022-11-22 DIAGNOSIS — J9601 Acute respiratory failure with hypoxia: Secondary | ICD-10-CM | POA: Diagnosis not present

## 2022-11-22 DIAGNOSIS — Z170 Estrogen receptor positive status [ER+]: Secondary | ICD-10-CM | POA: Diagnosis not present

## 2022-11-22 DIAGNOSIS — A419 Sepsis, unspecified organism: Secondary | ICD-10-CM | POA: Diagnosis not present

## 2022-11-22 DIAGNOSIS — Z7982 Long term (current) use of aspirin: Secondary | ICD-10-CM | POA: Diagnosis not present

## 2022-11-22 DIAGNOSIS — E781 Pure hyperglyceridemia: Secondary | ICD-10-CM | POA: Diagnosis not present

## 2022-11-22 DIAGNOSIS — Z86718 Personal history of other venous thrombosis and embolism: Secondary | ICD-10-CM | POA: Diagnosis not present

## 2022-11-22 DIAGNOSIS — Z792 Long term (current) use of antibiotics: Secondary | ICD-10-CM | POA: Diagnosis not present

## 2022-11-22 DIAGNOSIS — Z7951 Long term (current) use of inhaled steroids: Secondary | ICD-10-CM | POA: Diagnosis not present

## 2022-11-22 DIAGNOSIS — D630 Anemia in neoplastic disease: Secondary | ICD-10-CM | POA: Diagnosis not present

## 2022-11-22 DIAGNOSIS — M19049 Primary osteoarthritis, unspecified hand: Secondary | ICD-10-CM | POA: Diagnosis not present

## 2022-11-22 DIAGNOSIS — C7951 Secondary malignant neoplasm of bone: Secondary | ICD-10-CM | POA: Diagnosis not present

## 2022-11-22 DIAGNOSIS — Z87891 Personal history of nicotine dependence: Secondary | ICD-10-CM | POA: Diagnosis not present

## 2022-11-22 DIAGNOSIS — C50212 Malignant neoplasm of upper-inner quadrant of left female breast: Secondary | ICD-10-CM | POA: Diagnosis not present

## 2022-11-22 DIAGNOSIS — J449 Chronic obstructive pulmonary disease, unspecified: Secondary | ICD-10-CM | POA: Diagnosis not present

## 2022-11-22 DIAGNOSIS — Z604 Social exclusion and rejection: Secondary | ICD-10-CM | POA: Diagnosis not present

## 2022-11-22 DIAGNOSIS — I890 Lymphedema, not elsewhere classified: Secondary | ICD-10-CM | POA: Diagnosis not present

## 2022-11-22 DIAGNOSIS — Z7952 Long term (current) use of systemic steroids: Secondary | ICD-10-CM | POA: Diagnosis not present

## 2022-11-22 DIAGNOSIS — F419 Anxiety disorder, unspecified: Secondary | ICD-10-CM | POA: Diagnosis not present

## 2022-11-22 DIAGNOSIS — I10 Essential (primary) hypertension: Secondary | ICD-10-CM | POA: Diagnosis not present

## 2022-11-22 LAB — CULTURE, BLOOD (ROUTINE X 2)
Culture: NO GROWTH
Special Requests: ADEQUATE

## 2022-11-23 ENCOUNTER — Telehealth: Payer: Self-pay | Admitting: *Deleted

## 2022-11-23 ENCOUNTER — Telehealth: Payer: Self-pay | Admitting: Internal Medicine

## 2022-11-23 LAB — CULTURE, BLOOD (ROUTINE X 2)
Culture: NO GROWTH
Special Requests: ADEQUATE

## 2022-11-23 NOTE — Telephone Encounter (Signed)
Ok for verbal 

## 2022-11-23 NOTE — Telephone Encounter (Signed)
Caller & What Company: Arlys John from [facility]   Phone Number:  [phone]   Needs Verbal orders for what service & frequency: Home Health PT to work on strength/stamina - 2 weeks for 4 weeks, then 1 time a week for 5 weeks

## 2022-11-23 NOTE — Consult Note (Signed)
[facility]  [facility] Inpatient Consult   11/23/2022  Stacey [name] 10/09/1940 [id number]  Referral request: Post hospital DME follow up from [facility] office  Reviewed briefly for DME needs and patient/family may need to contact PCP office for DME as this writer could not find inpatient [facility] team hospital orders at this time.  Follow up information given to [facility] office staff, Stacey [name].  Charlesetta Shanks, RN, BSN, CCM [employer], [facility] Liaison Direct Dial: [phone] or secure chat Website: [email or web address]

## 2022-11-23 NOTE — Transitions of Care (Post Inpatient/ED Visit) (Signed)
11/23/2022  Name: Stacey [name] MRN: [id number] DOB: Jun 03, 1940  Today's TOC FU Call Status: Today's TOC FU Call Status:: Successful TOC FU Call Completed TOC FU Call Complete Date: 11/23/22 Patient's Name and Date of Birth confirmed.  Transition Care Management Follow-up Telephone Call Date of Discharge: 11/20/22 Discharge Facility: [name]) Type of Discharge: Inpatient Admission Primary Inpatient Discharge Diagnosis:: Sepsis How have you been since you were released from the hospital?: Better (still very weak and some shortness of breath that I have always with the cancer) Any questions or concerns?: Yes Patient Questions/Concerns:: I was suppose to get a walker and bsc and I never got it. . RN called the [facility] and they had never received the order. RN contacted PCP and order sent Patient Questions/Concerns Addressed: Notified Provider of Patient Questions/Concerns  Items Reviewed: Did you receive and understand the discharge instructions provided?: Yes Medications obtained,verified, and reconciled?: Yes (Medications Reviewed) Any new allergies since your discharge?: No Dietary orders reviewed?: No Do you have support at home?: Yes People in Home: alone Name of Support/Comfort Primary Source: Stacey [name] daughter  Medications Reviewed Today: Medications Reviewed Today     Reviewed by Luella Cook, RN (Case Manager) on 11/23/22 at 1616  Med List Status: <None>   Medication Order Taking? Sig Documenting Provider Last Dose Status Informant  acetaminophen (TYLENOL) 500 MG tablet [id number] Yes Take 1,000 mg by mouth See admin instructions. Take 2 tablets (1000 mg) by mouth daily at bedtime, may also take 2 tablets (1000 mg) two more times during the day or night as needed for pain [provider] Taking Active Self, Pharmacy Records  anastrozole (ARIMIDEX) 1 MG tablet [id number] Yes TAKE 1 TABLET(1 MG) BY MOUTH DAILY Causey, Larna Daughters, NP Taking Active Self,  Pharmacy Records  aspirin 81 MG EC tablet [id number] Yes Take 81 mg by mouth every morning. [provider] Taking Active Self, Pharmacy Records           Med Note Delton See, LISA L   Tue May 07, 2015  6:01 PM)    CALCIUM CARBONATE-VITAMIN D PO [id number] Yes Take 1 tablet by mouth 2 (two) times daily. [provider] Taking Active Self, Pharmacy Records  cefdinir (OMNICEF) 300 MG capsule [id number] Yes Take 1 capsule (300 mg total) by mouth 2 (two) times daily for 3 days. Jerald Kief, MD Taking Active   Cholecalciferol (VITAMIN D3 PO) [id number] Yes Take 1 capsule by mouth every morning. [provider] Taking Active Self, Pharmacy Records  dexamethasone (DECADRON) 4 MG tablet [id number] Yes Take 0.5 tablets (2 mg total) by mouth daily. Loa Socks, NP Taking Active Self, Pharmacy Records  diphenhydrAMINE (BENADRYL) 25 MG tablet [id number] Yes Take 25 mg by mouth every 6 (six) hours as needed (for bee stings).  [provider] Taking Active Self, Pharmacy Records  doxycycline (VIBRA-TABS) 100 MG tablet [id number] Yes Take 1 tablet (100 mg total) by mouth every 12 (twelve) hours for 3 days. Jerald Kief, MD Taking Active   fulvestrant (FASLODEX) 250 MG/5ML injection [id number] Yes Inject 250 mg into the muscle every 28 (twenty-eight) days. [provider] Taking Active Self, Pharmacy Records           Med Note Stacey [name]   Tue Nov 17, 2022 10:29 PM)    loperamide (IMODIUM) 2 MG capsule [id number] Yes 2-4 mg See admin instructions. Take 2 tablets (4 mg) by mouth for first loose stool, then 1 tablet (2 mg) for  every loose stool thereafter. Do not exceed 8 tablets (16 mg) in a 24 hour period. Stop loperamide if 12 hours have passed without a loose stool. Stacey, Valentino Hue, MD Taking Active Self, Pharmacy Records  loratadine (CLARITIN) 10 MG tablet [id number] Yes Take 10 mg by mouth every morning. [provider] Taking Active Self,  Pharmacy Records  Magnesium 500 MG CAPS [id number] Yes Take 500 mg by mouth every morning. Take with a 250 mg capsule for a total dose of 750 mg daily [provider] Taking Active Self, Pharmacy Records  MAGNESIUM PO [id number] Yes Take 250 mg by mouth every morning. Take with a 500 mg capsule for a total dose of 750 mg daily [provider] Taking Active Self, Pharmacy Records  Multiple Vitamin (MULTIVITAMIN WITH MINERALS) TABS tablet [id number] Yes Take 1 tablet by mouth every morning. Centrum [provider] Taking Active Self, Pharmacy Records  omeprazole (PRILOSEC) 20 MG capsule [id number] Yes Take 1 capsule by mouth daily [name], MD Taking Active Self, Pharmacy Records  palbociclib [facility]) 100 MG tablet [id number] Yes Take 1 tablet (100 mg total) by mouth Monday through Friday. Take as directed by MD. Rachel Moulds, MD Taking Active Self, Pharmacy Records  prochlorperazine (COMPAZINE) 10 MG tablet [id number] Yes Take 1 tablet (10 mg total) by mouth every 6 (six) hours as needed for nausea or vomiting. Stacey, Valentino Hue, MD Taking Active Self, Pharmacy Records  rivaroxaban (XARELTO) 20 MG TABS tablet [id number] Yes Take 1 tablet (20 mg total) by mouth daily with supper. Loa Socks, NP Taking Active Self, Pharmacy Records  telmisartan (MICARDIS) 40 MG tablet [id number] Yes Take 1 tablet (40 mg total) by mouth every morning. [name], MD Taking Active Self, Pharmacy Records  triamcinolone (NASACORT) 55 MCG/ACT AERO nasal inhaler [id number] Yes Place 2 sprays into the nose daily. 2 sprays each nostril at night before bedtime  Patient taking differently: Place 2 sprays into the nose See admin instructions. Instill 2 sprays into each nostril every night before bedtime   Drema Halon, MD Taking Active Self, Pharmacy Records  vitamin B-12 (CYANOCOBALAMIN) 100 MCG tablet [id number] Yes Take 100 mcg by mouth daily. [provider] Taking  Active Self, Pharmacy Records  zolpidem (AMBIEN) 10 MG tablet [id number] Yes Take 1 tablet (10 mg total) by mouth at bedtime as needed for sleep. [name], MD Taking Active Self, Pharmacy Records  Med List Note Jolee Ewing 03/09/22 2542): Ilda Foil filled through [facility] and Equipment/Supplies: Were Home Health Services Ordered?: Yes Name of Home Health Agency:: [facility] Has Agency set up a time to come to your home?: Yes First Home Health Visit Date: 11/24/22 Any new equipment or medical supplies ordered?: Yes Name of Medical supply agency?: [facility] Were you able to get the equipment/medical supplies?: No (RN contacted PCP and a new order was sent) Do you have any questions related to the use of the equipment/supplies?: No (RN instructed the patient on how to convcert the 3 in one into an elevated toliet over regular toliet)  Functional Questionnaire: Do you need assistance with bathing/showering or dressing?: Yes Do you need assistance with meal preparation?: Yes Do you need assistance with eating?: No Do you have difficulty maintaining continence: No Do you need assistance with getting out of bed/getting out of a chair/moving?: No Do you have difficulty managing or taking your medications?: No  Follow  up appointments reviewed: PCP Follow-up appointment confirmed?: Yes Date of PCP follow-up appointment?: 12/08/22 Follow-up Provider: Dr Oliver Barre Specialist [facility] Follow-up appointment confirmed?: NA Do you need transportation to your follow-up appointment?: No Do you understand care options if your condition(s) worsen?: Yes-patient verbalized understanding  SDOH Interventions Today    Flowsheet Row Most Recent Value  SDOH Interventions   Food Insecurity Interventions Intervention Not Indicated  Housing Interventions Intervention Not Indicated  Transportation Interventions Intervention Not Indicated, Patient Resources  (Friends/Family)  Utilities Interventions Intervention Not Indicated      Interventions Today    Flowsheet Row Most Recent Value  General Interventions   General Interventions Discussed/Reviewed General Interventions Discussed, General Interventions Reviewed, Doctor Visits  Doctor Visits Discussed/Reviewed Doctor Visits Discussed, Doctor Visits Reviewed  Education Interventions   Education Provided Provided Education  Provided Verbal Education On Other  [DME]  Pharmacy Interventions   Pharmacy Dicussed/Reviewed Pharmacy Topics Discussed, Pharmacy Topics Reviewed      TOC Interventions Today    Flowsheet Row Most Recent Value  TOC Interventions   TOC Interventions Discussed/Reviewed TOC Interventions Discussed, TOC Interventions Reviewed, Arranged PCP follow up less than 12 days/Care Guide scheduled, Troubleshoot use of DME in the home      Patient will call RN back on [phone] to let her know when she received the DME.   Gean Maidens BSN RN [facility] [phone]

## 2022-11-24 NOTE — Telephone Encounter (Signed)
Called and left voicemail giving verbals.

## 2022-11-25 ENCOUNTER — Telehealth: Payer: Self-pay | Admitting: *Deleted

## 2022-11-25 ENCOUNTER — Other Ambulatory Visit: Payer: Self-pay | Admitting: Internal Medicine

## 2022-11-25 DIAGNOSIS — J9601 Acute respiratory failure with hypoxia: Secondary | ICD-10-CM | POA: Diagnosis not present

## 2022-11-25 DIAGNOSIS — A419 Sepsis, unspecified organism: Secondary | ICD-10-CM | POA: Diagnosis not present

## 2022-11-25 DIAGNOSIS — D630 Anemia in neoplastic disease: Secondary | ICD-10-CM | POA: Diagnosis not present

## 2022-11-25 DIAGNOSIS — J449 Chronic obstructive pulmonary disease, unspecified: Secondary | ICD-10-CM | POA: Diagnosis not present

## 2022-11-25 DIAGNOSIS — R531 Weakness: Secondary | ICD-10-CM

## 2022-11-25 DIAGNOSIS — I509 Heart failure, unspecified: Secondary | ICD-10-CM

## 2022-11-25 DIAGNOSIS — C50212 Malignant neoplasm of upper-inner quadrant of left female breast: Secondary | ICD-10-CM | POA: Diagnosis not present

## 2022-11-25 DIAGNOSIS — G1221 Amyotrophic lateral sclerosis: Secondary | ICD-10-CM

## 2022-11-25 DIAGNOSIS — C7951 Secondary malignant neoplasm of bone: Secondary | ICD-10-CM | POA: Diagnosis not present

## 2022-11-25 DIAGNOSIS — R269 Unspecified abnormalities of gait and mobility: Secondary | ICD-10-CM

## 2022-11-25 NOTE — Patient Outreach (Signed)
Care Coordination   Follow Up Visit Note   11/25/2022 Name: Stacey [name] MRN: [id number] DOB: [birth year]  Stacey [name] is a 82 y.o. year old female who sees [name], MD for primary care. I spoke with  [name] by phone today.  What matters to the patients health and wellness today?  Patient has not received the rollator walker and the [facility]. RN contacted [facility] and notified the PCP to resend order.   SDOH assessments and interventions completed:  Yes     Care Coordination Interventions:  Yes, provided   Follow up plan: Follow up call scheduled for Monday [phone]  to see if patient received equipment,     Encounter Outcome:  Patient Request to Call Back   Gean Maidens BSN RN [facility] [phone]

## 2022-11-26 ENCOUNTER — Telehealth: Payer: Self-pay

## 2022-11-26 NOTE — Telephone Encounter (Signed)
Called pt per NP. Pt states she has finished abx since hospitalization and continues to feel SHOB, fatigue and diarrhea. She is requesting a stronger dose of a steroid than her prescribed dexamethasone 2 mg. She states that does nothing for her and she would like something stronger. She understands that she is taking Xarelto and has been told in the past that she can't have high dose steroids d/t this but she states she wants something that will make her feel better. Advised pt I would make NP aware of concerns. She has a PCP f/u scheduled 12/08/22. This message routed to Lillard Anes, NP

## 2022-11-26 NOTE — Telephone Encounter (Signed)
Can she see Stacey [name] in [facility] tomorrow?

## 2022-11-26 NOTE — Telephone Encounter (Signed)
-----   Message from Noreene Filbert sent at 11/26/2022  1:37 PM EDT ----- Scans appear stable.  We can review further at her next appointment. ----- Message ----- From: Interface, Rad Results In Sent: 11/23/2022   3:44 PM EDT To: Loa Socks, NP

## 2022-11-27 DIAGNOSIS — C7951 Secondary malignant neoplasm of bone: Secondary | ICD-10-CM | POA: Diagnosis not present

## 2022-11-27 DIAGNOSIS — A419 Sepsis, unspecified organism: Secondary | ICD-10-CM | POA: Diagnosis not present

## 2022-11-27 DIAGNOSIS — D630 Anemia in neoplastic disease: Secondary | ICD-10-CM | POA: Diagnosis not present

## 2022-11-27 DIAGNOSIS — J449 Chronic obstructive pulmonary disease, unspecified: Secondary | ICD-10-CM | POA: Diagnosis not present

## 2022-11-27 DIAGNOSIS — J9601 Acute respiratory failure with hypoxia: Secondary | ICD-10-CM | POA: Diagnosis not present

## 2022-11-27 DIAGNOSIS — C50212 Malignant neoplasm of upper-inner quadrant of left female breast: Secondary | ICD-10-CM | POA: Diagnosis not present

## 2022-11-30 ENCOUNTER — Telehealth: Payer: Self-pay | Admitting: Internal Medicine

## 2022-11-30 DIAGNOSIS — D630 Anemia in neoplastic disease: Secondary | ICD-10-CM | POA: Diagnosis not present

## 2022-11-30 DIAGNOSIS — J9601 Acute respiratory failure with hypoxia: Secondary | ICD-10-CM | POA: Diagnosis not present

## 2022-11-30 DIAGNOSIS — C7951 Secondary malignant neoplasm of bone: Secondary | ICD-10-CM | POA: Diagnosis not present

## 2022-11-30 DIAGNOSIS — A419 Sepsis, unspecified organism: Secondary | ICD-10-CM | POA: Diagnosis not present

## 2022-11-30 DIAGNOSIS — C50212 Malignant neoplasm of upper-inner quadrant of left female breast: Secondary | ICD-10-CM | POA: Diagnosis not present

## 2022-11-30 DIAGNOSIS — J449 Chronic obstructive pulmonary disease, unspecified: Secondary | ICD-10-CM | POA: Diagnosis not present

## 2022-11-30 NOTE — Telephone Encounter (Signed)
Caller & What Company:   [facility]  Phone Number:  [phone]   Needs Verbal orders for what service & frequency:Home Health Evaluation for Social Work for Care Give Assistant

## 2022-12-01 DIAGNOSIS — A419 Sepsis, unspecified organism: Secondary | ICD-10-CM | POA: Diagnosis not present

## 2022-12-01 DIAGNOSIS — C7951 Secondary malignant neoplasm of bone: Secondary | ICD-10-CM | POA: Diagnosis not present

## 2022-12-01 DIAGNOSIS — C50212 Malignant neoplasm of upper-inner quadrant of left female breast: Secondary | ICD-10-CM | POA: Diagnosis not present

## 2022-12-01 DIAGNOSIS — J9601 Acute respiratory failure with hypoxia: Secondary | ICD-10-CM | POA: Diagnosis not present

## 2022-12-01 DIAGNOSIS — D630 Anemia in neoplastic disease: Secondary | ICD-10-CM | POA: Diagnosis not present

## 2022-12-01 DIAGNOSIS — J449 Chronic obstructive pulmonary disease, unspecified: Secondary | ICD-10-CM | POA: Diagnosis not present

## 2022-12-01 NOTE — Telephone Encounter (Signed)
Ok for verbal 

## 2022-12-01 NOTE — Telephone Encounter (Signed)
Called and left voicemail giving verbals.

## 2022-12-02 ENCOUNTER — Ambulatory Visit (HOSPITAL_COMMUNITY): Payer: Medicare Other

## 2022-12-02 DIAGNOSIS — C50212 Malignant neoplasm of upper-inner quadrant of left female breast: Secondary | ICD-10-CM | POA: Diagnosis not present

## 2022-12-02 DIAGNOSIS — J449 Chronic obstructive pulmonary disease, unspecified: Secondary | ICD-10-CM | POA: Diagnosis not present

## 2022-12-02 DIAGNOSIS — A419 Sepsis, unspecified organism: Secondary | ICD-10-CM | POA: Diagnosis not present

## 2022-12-02 DIAGNOSIS — J9601 Acute respiratory failure with hypoxia: Secondary | ICD-10-CM | POA: Diagnosis not present

## 2022-12-02 DIAGNOSIS — C7951 Secondary malignant neoplasm of bone: Secondary | ICD-10-CM | POA: Diagnosis not present

## 2022-12-02 DIAGNOSIS — D630 Anemia in neoplastic disease: Secondary | ICD-10-CM | POA: Diagnosis not present

## 2022-12-04 ENCOUNTER — Emergency Department (HOSPITAL_COMMUNITY): Payer: Medicare Other

## 2022-12-04 ENCOUNTER — Encounter (HOSPITAL_COMMUNITY): Payer: Self-pay

## 2022-12-04 ENCOUNTER — Other Ambulatory Visit: Payer: Self-pay

## 2022-12-04 ENCOUNTER — Inpatient Hospital Stay (HOSPITAL_COMMUNITY)
Admission: EM | Admit: 2022-12-04 | Discharge: 2022-12-16 | DRG: 193 | Disposition: A | Discharge status: Skilled Nursing Facility | Payer: Medicare Other | Attending: Internal Medicine | Admitting: Internal Medicine

## 2022-12-04 DIAGNOSIS — R2689 Other abnormalities of gait and mobility: Secondary | ICD-10-CM | POA: Diagnosis not present

## 2022-12-04 DIAGNOSIS — Z888 Allergy status to other drugs, medicaments and biological substances status: Secondary | ICD-10-CM

## 2022-12-04 DIAGNOSIS — J690 Pneumonitis due to inhalation of food and vomit: Secondary | ICD-10-CM | POA: Diagnosis present

## 2022-12-04 DIAGNOSIS — K219 Gastro-esophageal reflux disease without esophagitis: Secondary | ICD-10-CM | POA: Diagnosis not present

## 2022-12-04 DIAGNOSIS — C50912 Malignant neoplasm of unspecified site of left female breast: Secondary | ICD-10-CM | POA: Diagnosis not present

## 2022-12-04 DIAGNOSIS — Z9889 Other specified postprocedural states: Secondary | ICD-10-CM

## 2022-12-04 DIAGNOSIS — N1832 Chronic kidney disease, stage 3b: Secondary | ICD-10-CM | POA: Diagnosis present

## 2022-12-04 DIAGNOSIS — I130 Hypertensive heart and chronic kidney disease with heart failure and stage 1 through stage 4 chronic kidney disease, or unspecified chronic kidney disease: Secondary | ICD-10-CM | POA: Diagnosis not present

## 2022-12-04 DIAGNOSIS — Z9841 Cataract extraction status, right eye: Secondary | ICD-10-CM

## 2022-12-04 DIAGNOSIS — Z83719 Family history of colon polyps, unspecified: Secondary | ICD-10-CM

## 2022-12-04 DIAGNOSIS — E872 Acidosis, unspecified: Secondary | ICD-10-CM

## 2022-12-04 DIAGNOSIS — J9811 Atelectasis: Secondary | ICD-10-CM | POA: Diagnosis not present

## 2022-12-04 DIAGNOSIS — J984 Other disorders of lung: Secondary | ICD-10-CM | POA: Diagnosis not present

## 2022-12-04 DIAGNOSIS — Z7401 Bed confinement status: Secondary | ICD-10-CM | POA: Diagnosis not present

## 2022-12-04 DIAGNOSIS — I951 Orthostatic hypotension: Secondary | ICD-10-CM | POA: Diagnosis not present

## 2022-12-04 DIAGNOSIS — Z79811 Long term (current) use of aromatase inhibitors: Secondary | ICD-10-CM

## 2022-12-04 DIAGNOSIS — R0902 Hypoxemia: Secondary | ICD-10-CM | POA: Diagnosis not present

## 2022-12-04 DIAGNOSIS — E8721 Acute metabolic acidosis: Secondary | ICD-10-CM | POA: Diagnosis not present

## 2022-12-04 DIAGNOSIS — R918 Other nonspecific abnormal finding of lung field: Secondary | ICD-10-CM | POA: Diagnosis not present

## 2022-12-04 DIAGNOSIS — K521 Toxic gastroenteritis and colitis: Secondary | ICD-10-CM | POA: Diagnosis present

## 2022-12-04 DIAGNOSIS — I10 Essential (primary) hypertension: Secondary | ICD-10-CM | POA: Diagnosis not present

## 2022-12-04 DIAGNOSIS — R5383 Other fatigue: Secondary | ICD-10-CM

## 2022-12-04 DIAGNOSIS — Z7952 Long term (current) use of systemic steroids: Secondary | ICD-10-CM

## 2022-12-04 DIAGNOSIS — Z9221 Personal history of antineoplastic chemotherapy: Secondary | ICD-10-CM

## 2022-12-04 DIAGNOSIS — Z923 Personal history of irradiation: Secondary | ICD-10-CM

## 2022-12-04 DIAGNOSIS — C771 Secondary and unspecified malignant neoplasm of intrathoracic lymph nodes: Secondary | ICD-10-CM | POA: Diagnosis not present

## 2022-12-04 DIAGNOSIS — M6281 Muscle weakness (generalized): Secondary | ICD-10-CM | POA: Diagnosis not present

## 2022-12-04 DIAGNOSIS — J439 Emphysema, unspecified: Secondary | ICD-10-CM | POA: Diagnosis present

## 2022-12-04 DIAGNOSIS — J988 Other specified respiratory disorders: Secondary | ICD-10-CM | POA: Diagnosis not present

## 2022-12-04 DIAGNOSIS — Z807 Family history of other malignant neoplasms of lymphoid, hematopoietic and related tissues: Secondary | ICD-10-CM

## 2022-12-04 DIAGNOSIS — D61818 Other pancytopenia: Secondary | ICD-10-CM | POA: Diagnosis not present

## 2022-12-04 DIAGNOSIS — Z853 Personal history of malignant neoplasm of breast: Secondary | ICD-10-CM

## 2022-12-04 DIAGNOSIS — D84821 Immunodeficiency due to drugs: Secondary | ICD-10-CM

## 2022-12-04 DIAGNOSIS — Z885 Allergy status to narcotic agent status: Secondary | ICD-10-CM

## 2022-12-04 DIAGNOSIS — I6522 Occlusion and stenosis of left carotid artery: Secondary | ICD-10-CM | POA: Diagnosis present

## 2022-12-04 DIAGNOSIS — T451X5A Adverse effect of antineoplastic and immunosuppressive drugs, initial encounter: Secondary | ICD-10-CM | POA: Diagnosis not present

## 2022-12-04 DIAGNOSIS — J189 Pneumonia, unspecified organism: Secondary | ICD-10-CM | POA: Diagnosis not present

## 2022-12-04 DIAGNOSIS — Z8701 Personal history of pneumonia (recurrent): Secondary | ICD-10-CM

## 2022-12-04 DIAGNOSIS — E876 Hypokalemia: Secondary | ICD-10-CM | POA: Diagnosis present

## 2022-12-04 DIAGNOSIS — Z90710 Acquired absence of both cervix and uterus: Secondary | ICD-10-CM

## 2022-12-04 DIAGNOSIS — I4719 Other supraventricular tachycardia: Secondary | ICD-10-CM | POA: Diagnosis present

## 2022-12-04 DIAGNOSIS — K8020 Calculus of gallbladder without cholecystitis without obstruction: Secondary | ICD-10-CM | POA: Diagnosis not present

## 2022-12-04 DIAGNOSIS — Y95 Nosocomial condition: Secondary | ICD-10-CM | POA: Diagnosis present

## 2022-12-04 DIAGNOSIS — R932 Abnormal findings on diagnostic imaging of liver and biliary tract: Secondary | ICD-10-CM | POA: Diagnosis not present

## 2022-12-04 DIAGNOSIS — N170 Acute kidney failure with tubular necrosis: Secondary | ICD-10-CM | POA: Diagnosis not present

## 2022-12-04 DIAGNOSIS — Z91030 Bee allergy status: Secondary | ICD-10-CM

## 2022-12-04 DIAGNOSIS — R911 Solitary pulmonary nodule: Secondary | ICD-10-CM | POA: Diagnosis present

## 2022-12-04 DIAGNOSIS — C7951 Secondary malignant neoplasm of bone: Secondary | ICD-10-CM | POA: Diagnosis not present

## 2022-12-04 DIAGNOSIS — C7981 Secondary malignant neoplasm of breast: Secondary | ICD-10-CM | POA: Diagnosis present

## 2022-12-04 DIAGNOSIS — D649 Anemia, unspecified: Secondary | ICD-10-CM | POA: Diagnosis not present

## 2022-12-04 DIAGNOSIS — J440 Chronic obstructive pulmonary disease with acute lower respiratory infection: Secondary | ICD-10-CM | POA: Diagnosis not present

## 2022-12-04 DIAGNOSIS — E785 Hyperlipidemia, unspecified: Secondary | ICD-10-CM | POA: Diagnosis present

## 2022-12-04 DIAGNOSIS — Z8249 Family history of ischemic heart disease and other diseases of the circulatory system: Secondary | ICD-10-CM

## 2022-12-04 DIAGNOSIS — J9601 Acute respiratory failure with hypoxia: Principal | ICD-10-CM | POA: Diagnosis present

## 2022-12-04 DIAGNOSIS — R457 State of emotional shock and stress, unspecified: Secondary | ICD-10-CM | POA: Diagnosis not present

## 2022-12-04 DIAGNOSIS — D638 Anemia in other chronic diseases classified elsewhere: Secondary | ICD-10-CM | POA: Diagnosis present

## 2022-12-04 DIAGNOSIS — C50911 Malignant neoplasm of unspecified site of right female breast: Secondary | ICD-10-CM | POA: Diagnosis not present

## 2022-12-04 DIAGNOSIS — I82409 Acute embolism and thrombosis of unspecified deep veins of unspecified lower extremity: Secondary | ICD-10-CM

## 2022-12-04 DIAGNOSIS — R0689 Other abnormalities of breathing: Secondary | ICD-10-CM | POA: Diagnosis not present

## 2022-12-04 DIAGNOSIS — M19049 Primary osteoarthritis, unspecified hand: Secondary | ICD-10-CM | POA: Diagnosis present

## 2022-12-04 DIAGNOSIS — T17890A Other foreign object in other parts of respiratory tract causing asphyxiation, initial encounter: Secondary | ICD-10-CM | POA: Diagnosis present

## 2022-12-04 DIAGNOSIS — Z8043 Family history of malignant neoplasm of testis: Secondary | ICD-10-CM

## 2022-12-04 DIAGNOSIS — Q391 Atresia of esophagus with tracheo-esophageal fistula: Secondary | ICD-10-CM | POA: Diagnosis not present

## 2022-12-04 DIAGNOSIS — Z7982 Long term (current) use of aspirin: Secondary | ICD-10-CM

## 2022-12-04 DIAGNOSIS — Z881 Allergy status to other antibiotic agents status: Secondary | ICD-10-CM

## 2022-12-04 DIAGNOSIS — I2510 Atherosclerotic heart disease of native coronary artery without angina pectoris: Secondary | ICD-10-CM | POA: Diagnosis not present

## 2022-12-04 DIAGNOSIS — T380X5A Adverse effect of glucocorticoids and synthetic analogues, initial encounter: Secondary | ICD-10-CM

## 2022-12-04 DIAGNOSIS — Z7901 Long term (current) use of anticoagulants: Secondary | ICD-10-CM

## 2022-12-04 DIAGNOSIS — Z79899 Other long term (current) drug therapy: Secondary | ICD-10-CM

## 2022-12-04 DIAGNOSIS — R251 Tremor, unspecified: Secondary | ICD-10-CM | POA: Diagnosis present

## 2022-12-04 DIAGNOSIS — I129 Hypertensive chronic kidney disease with stage 1 through stage 4 chronic kidney disease, or unspecified chronic kidney disease: Secondary | ICD-10-CM | POA: Diagnosis not present

## 2022-12-04 DIAGNOSIS — I5021 Acute systolic (congestive) heart failure: Secondary | ICD-10-CM | POA: Diagnosis not present

## 2022-12-04 DIAGNOSIS — Z800 Family history of malignant neoplasm of digestive organs: Secondary | ICD-10-CM

## 2022-12-04 DIAGNOSIS — M8580 Other specified disorders of bone density and structure, unspecified site: Secondary | ICD-10-CM | POA: Diagnosis present

## 2022-12-04 DIAGNOSIS — Z1152 Encounter for screening for COVID-19: Secondary | ICD-10-CM | POA: Diagnosis not present

## 2022-12-04 DIAGNOSIS — I5032 Chronic diastolic (congestive) heart failure: Secondary | ICD-10-CM | POA: Diagnosis present

## 2022-12-04 DIAGNOSIS — Z602 Problems related to living alone: Secondary | ICD-10-CM | POA: Diagnosis present

## 2022-12-04 DIAGNOSIS — Z803 Family history of malignant neoplasm of breast: Secondary | ICD-10-CM

## 2022-12-04 DIAGNOSIS — J90 Pleural effusion, not elsewhere classified: Secondary | ICD-10-CM | POA: Diagnosis not present

## 2022-12-04 DIAGNOSIS — R0602 Shortness of breath: Secondary | ICD-10-CM | POA: Diagnosis not present

## 2022-12-04 DIAGNOSIS — W44F9XA Other object of natural or organic material, entering into or through a natural orifice, initial encounter: Secondary | ICD-10-CM | POA: Diagnosis present

## 2022-12-04 DIAGNOSIS — Z87891 Personal history of nicotine dependence: Secondary | ICD-10-CM

## 2022-12-04 DIAGNOSIS — Z8619 Personal history of other infectious and parasitic diseases: Secondary | ICD-10-CM

## 2022-12-04 DIAGNOSIS — R197 Diarrhea, unspecified: Secondary | ICD-10-CM

## 2022-12-04 DIAGNOSIS — Z86718 Personal history of other venous thrombosis and embolism: Secondary | ICD-10-CM

## 2022-12-04 DIAGNOSIS — K449 Diaphragmatic hernia without obstruction or gangrene: Secondary | ICD-10-CM | POA: Diagnosis present

## 2022-12-04 DIAGNOSIS — Z801 Family history of malignant neoplasm of trachea, bronchus and lung: Secondary | ICD-10-CM

## 2022-12-04 DIAGNOSIS — R339 Retention of urine, unspecified: Secondary | ICD-10-CM | POA: Diagnosis not present

## 2022-12-04 DIAGNOSIS — N179 Acute kidney failure, unspecified: Secondary | ICD-10-CM | POA: Diagnosis not present

## 2022-12-04 DIAGNOSIS — Z833 Family history of diabetes mellitus: Secondary | ICD-10-CM

## 2022-12-04 DIAGNOSIS — Z8719 Personal history of other diseases of the digestive system: Secondary | ICD-10-CM

## 2022-12-04 DIAGNOSIS — R54 Age-related physical debility: Secondary | ICD-10-CM | POA: Diagnosis present

## 2022-12-04 DIAGNOSIS — J9621 Acute and chronic respiratory failure with hypoxia: Secondary | ICD-10-CM

## 2022-12-04 DIAGNOSIS — Z8049 Family history of malignant neoplasm of other genital organs: Secondary | ICD-10-CM

## 2022-12-04 DIAGNOSIS — Z170 Estrogen receptor positive status [ER+]: Secondary | ICD-10-CM

## 2022-12-04 DIAGNOSIS — I890 Lymphedema, not elsewhere classified: Secondary | ICD-10-CM | POA: Diagnosis present

## 2022-12-04 DIAGNOSIS — J9600 Acute respiratory failure, unspecified whether with hypoxia or hypercapnia: Secondary | ICD-10-CM | POA: Diagnosis not present

## 2022-12-04 DIAGNOSIS — M40209 Unspecified kyphosis, site unspecified: Secondary | ICD-10-CM | POA: Diagnosis present

## 2022-12-04 DIAGNOSIS — R278 Other lack of coordination: Secondary | ICD-10-CM | POA: Diagnosis not present

## 2022-12-04 DIAGNOSIS — C50919 Malignant neoplasm of unspecified site of unspecified female breast: Secondary | ICD-10-CM

## 2022-12-04 DIAGNOSIS — F419 Anxiety disorder, unspecified: Secondary | ICD-10-CM | POA: Diagnosis present

## 2022-12-04 DIAGNOSIS — R112 Nausea with vomiting, unspecified: Secondary | ICD-10-CM

## 2022-12-04 DIAGNOSIS — Z9842 Cataract extraction status, left eye: Secondary | ICD-10-CM

## 2022-12-04 DIAGNOSIS — Z808 Family history of malignant neoplasm of other organs or systems: Secondary | ICD-10-CM

## 2022-12-04 DIAGNOSIS — Z823 Family history of stroke: Secondary | ICD-10-CM

## 2022-12-04 DIAGNOSIS — R059 Cough, unspecified: Secondary | ICD-10-CM | POA: Diagnosis not present

## 2022-12-04 DIAGNOSIS — Z7951 Long term (current) use of inhaled steroids: Secondary | ICD-10-CM

## 2022-12-04 DIAGNOSIS — Z9089 Acquired absence of other organs: Secondary | ICD-10-CM

## 2022-12-04 DIAGNOSIS — Z9049 Acquired absence of other specified parts of digestive tract: Secondary | ICD-10-CM

## 2022-12-04 LAB — BLOOD GAS, ARTERIAL
Acid-base deficit: 8.8 mmol/L — ABNORMAL HIGH (ref 0.0–2.0)
Bicarbonate: 12.9 mmol/L — ABNORMAL LOW (ref 20.0–28.0)
Drawn by: 11249
O2 Content: 21 L/min
O2 Saturation: 100 %
Patient temperature: 36.6
pCO2 arterial: 19 mm[Hg] — CL (ref 32–48)
pH, Arterial: 7.45 (ref 7.35–7.45)
pO2, Arterial: 91 mm[Hg] (ref 83–108)

## 2022-12-04 LAB — CBC WITH DIFFERENTIAL/PLATELET
Abs Immature Granulocytes: 0.04 10*3/uL (ref 0.00–0.07)
Basophils Absolute: 0 10*3/uL (ref 0.0–0.1)
Basophils Relative: 1 %
Eosinophils Absolute: 0 10*3/uL (ref 0.0–0.5)
Eosinophils Relative: 1 %
HCT: 25.2 % — ABNORMAL LOW (ref 36.0–46.0)
Hemoglobin: 8.3 g/dL — ABNORMAL LOW (ref 12.0–15.0)
Immature Granulocytes: 1 %
Lymphocytes Relative: 17 %
Lymphs Abs: 0.6 10*3/uL — ABNORMAL LOW (ref 0.7–4.0)
MCH: 35.3 pg — ABNORMAL HIGH (ref 26.0–34.0)
MCHC: 32.9 g/dL (ref 30.0–36.0)
MCV: 107.2 fL — ABNORMAL HIGH (ref 80.0–100.0)
Monocytes Absolute: 0.3 10*3/uL (ref 0.1–1.0)
Monocytes Relative: 7 %
Neutro Abs: 2.5 10*3/uL (ref 1.7–7.7)
Neutrophils Relative %: 73 %
Platelets: 185 10*3/uL (ref 150–400)
RBC: 2.35 MIL/uL — ABNORMAL LOW (ref 3.87–5.11)
RDW: 15.8 % — ABNORMAL HIGH (ref 11.5–15.5)
WBC: 3.4 10*3/uL — ABNORMAL LOW (ref 4.0–10.5)
nRBC: 0 % (ref 0.0–0.2)

## 2022-12-04 LAB — HEPATIC FUNCTION PANEL
ALT: 14 U/L (ref 0–44)
AST: 22 U/L (ref 15–41)
Albumin: 3 g/dL — ABNORMAL LOW (ref 3.5–5.0)
Alkaline Phosphatase: 46 U/L (ref 38–126)
Bilirubin, Direct: <0.1 mg/dL (ref 0.0–0.2)
Total Bilirubin: 0.8 mg/dL (ref 0.3–1.2)
Total Protein: 6.7 g/dL (ref 6.5–8.1)

## 2022-12-04 LAB — BASIC METABOLIC PANEL
Anion gap: 13 (ref 5–15)
Anion gap: 13 (ref 5–15)
BUN: 20 mg/dL (ref 8–23)
BUN: 19 mg/dL (ref 8–23)
CO2: 17 mmol/L — ABNORMAL LOW (ref 22–32)
CO2: 16 mmol/L — ABNORMAL LOW (ref 22–32)
Calcium: 6.3 mg/dL — CL (ref 8.9–10.3)
Calcium: 6.2 mg/dL — CL (ref 8.9–10.3)
Chloride: 112 mmol/L — ABNORMAL HIGH (ref 98–111)
Chloride: 114 mmol/L — ABNORMAL HIGH (ref 98–111)
Creatinine, Ser: 1.46 mg/dL — ABNORMAL HIGH (ref 0.44–1.00)
Creatinine, Ser: 1.22 mg/dL — ABNORMAL HIGH (ref 0.44–1.00)
GFR, Estimated: 36 mL/min — ABNORMAL LOW (ref >60)
GFR, Estimated: 44 mL/min — ABNORMAL LOW (ref >60)
Glucose, Bld: 110 mg/dL — ABNORMAL HIGH (ref 70–99)
Glucose, Bld: 112 mg/dL — ABNORMAL HIGH (ref 70–99)
Potassium: 3.4 mmol/L — ABNORMAL LOW (ref 3.5–5.1)
Potassium: 2.9 mmol/L — ABNORMAL LOW (ref 3.5–5.1)
Sodium: 142 mmol/L (ref 135–145)
Sodium: 143 mmol/L (ref 135–145)

## 2022-12-04 LAB — MAGNESIUM: Magnesium: <0.5 mg/dL — CL (ref 1.7–2.4)

## 2022-12-04 LAB — ALBUMIN: Albumin: 2.9 g/dL — ABNORMAL LOW (ref 3.5–5.0)

## 2022-12-04 LAB — TROPONIN I (HIGH SENSITIVITY)
Troponin I (High Sensitivity): 29 ng/L — ABNORMAL HIGH (ref <18)
Troponin I (High Sensitivity): 30 ng/L — ABNORMAL HIGH (ref <18)

## 2022-12-04 LAB — I-STAT CG4 LACTIC ACID, ED
Lactic Acid, Venous: 1.1 mmol/L (ref 0.5–1.9)
Lactic Acid, Venous: 0.6 mmol/L (ref 0.5–1.9)

## 2022-12-04 LAB — SARS CORONAVIRUS 2 BY RT PCR: SARS Coronavirus 2 by RT PCR: NEGATIVE

## 2022-12-04 LAB — BRAIN NATRIURETIC PEPTIDE: B Natriuretic Peptide: 164.7 pg/mL — ABNORMAL HIGH (ref 0.0–100.0)

## 2022-12-04 LAB — PHOSPHORUS: Phosphorus: 3.9 mg/dL (ref 2.5–4.6)

## 2022-12-04 MED ORDER — POTASSIUM CHLORIDE 20 MEQ PO PACK
40.0000 meq | PACK | Freq: Once | ORAL | Status: AC
  Administered 2022-12-04: 40 meq via ORAL
  Filled 2022-12-04: qty 2

## 2022-12-04 MED ORDER — SODIUM BICARBONATE 650 MG PO TABS
650.0000 mg | ORAL_TABLET | Freq: Three times a day (TID) | ORAL | Status: DC
  Administered 2022-12-04 – 2022-12-08 (×11): 650 mg via ORAL
  Filled 2022-12-04 (×12): qty 1

## 2022-12-04 MED ORDER — ACETAMINOPHEN 325 MG PO TABS: 650.0000 mg | ORAL_TABLET | Freq: Four times a day (QID) | ORAL | Status: DC | PRN

## 2022-12-04 MED ORDER — RIVAROXABAN 20 MG PO TABS
20.0000 mg | ORAL_TABLET | Freq: Every day | ORAL | Status: DC
  Administered 2022-12-04: 20 mg via ORAL
  Filled 2022-12-04: qty 1

## 2022-12-04 MED ORDER — SODIUM BICARBONATE 8.4 % IV SOLN
INTRAVENOUS | Status: DC
  Filled 2022-12-04: qty 150
  Filled 2022-12-04: qty 1000
  Filled 2022-12-04 (×2): qty 150
  Filled 2022-12-04: qty 1000
  Filled 2022-12-04: qty 150

## 2022-12-04 MED ORDER — SODIUM CHLORIDE 0.9 % IV BOLUS
500.0000 mL | Freq: Once | INTRAVENOUS | Status: AC
  Administered 2022-12-04: 500 mL via INTRAVENOUS

## 2022-12-04 MED ORDER — CALCIUM GLUCONATE-NACL 1-0.675 GM/50ML-% IV SOLN
1.0000 g | Freq: Once | INTRAVENOUS | Status: AC
  Administered 2022-12-04: 1000 mg via INTRAVENOUS
  Filled 2022-12-04: qty 50

## 2022-12-04 MED ORDER — ANASTROZOLE 1 MG PO TABS
1.0000 mg | ORAL_TABLET | Freq: Every day | ORAL | Status: DC
  Administered 2022-12-05 – 2022-12-16 (×12): 1 mg via ORAL
  Filled 2022-12-04 (×12): qty 1

## 2022-12-04 MED ORDER — ACETAMINOPHEN 650 MG RE SUPP: 650.0000 mg | Freq: Four times a day (QID) | RECTAL | Status: DC | PRN

## 2022-12-04 MED ORDER — ACETYLCYSTEINE 10% NICU INHALATION SOLUTION: 4.0000 mL | Freq: Three times a day (TID) | RESPIRATORY_TRACT | Status: DC

## 2022-12-04 MED ORDER — ALBUTEROL SULFATE (2.5 MG/3ML) 0.083% IN NEBU: 2.5000 mg | INHALATION_SOLUTION | Freq: Three times a day (TID) | RESPIRATORY_TRACT | Status: DC

## 2022-12-04 MED ORDER — POLYETHYLENE GLYCOL 3350 17 G PO PACK
17.0000 g | PACK | Freq: Every day | ORAL | Status: DC | PRN
  Filled 2022-12-04: qty 1

## 2022-12-04 MED ORDER — PIPERACILLIN-TAZOBACTAM 3.375 G IVPB 30 MIN
3.3750 g | Freq: Once | INTRAVENOUS | Status: AC
  Administered 2022-12-04: 3.375 g via INTRAVENOUS
  Filled 2022-12-04: qty 50

## 2022-12-04 MED ORDER — MAGNESIUM SULFATE 2 GM/50ML IV SOLN
2.0000 g | INTRAVENOUS | Status: AC
  Administered 2022-12-04: 2 g via INTRAVENOUS
  Filled 2022-12-04: qty 50

## 2022-12-04 MED ORDER — DEXAMETHASONE 2 MG PO TABS
2.0000 mg | ORAL_TABLET | Freq: Every day | ORAL | Status: DC
  Administered 2022-12-04: 2 mg via ORAL
  Filled 2022-12-04 (×3): qty 1

## 2022-12-04 MED ORDER — IOHEXOL 350 MG/ML SOLN
75.0000 mL | Freq: Once | INTRAVENOUS | Status: AC | PRN
  Administered 2022-12-04: 75 mL via INTRAVENOUS

## 2022-12-04 MED ORDER — ASPIRIN 81 MG PO CHEW
81.0000 mg | CHEWABLE_TABLET | Freq: Every day | ORAL | Status: DC
  Administered 2022-12-04 – 2022-12-16 (×12): 81 mg via ORAL
  Filled 2022-12-04 (×13): qty 1

## 2022-12-04 MED ORDER — ACETYLCYSTEINE 20 % IN SOLN
4.0000 mL | Freq: Three times a day (TID) | RESPIRATORY_TRACT | Status: DC
  Filled 2022-12-04: qty 4

## 2022-12-04 MED ORDER — SODIUM CHLORIDE 0.9% FLUSH
3.0000 mL | Freq: Two times a day (BID) | INTRAVENOUS | Status: DC
  Administered 2022-12-04 – 2022-12-16 (×22): 3 mL via INTRAVENOUS

## 2022-12-04 MED ORDER — CALCIUM GLUCONATE-NACL 2-0.675 GM/100ML-% IV SOLN
2.0000 g | Freq: Once | INTRAVENOUS | Status: AC
  Administered 2022-12-05: 2000 mg via INTRAVENOUS
  Filled 2022-12-04: qty 100

## 2022-12-04 NOTE — Assessment & Plan Note (Signed)
Patient has chronic appearing metabolic acidosis associated with low potassium as well as normal anion gap.  Patient is also noted to have chronic kidney disease.  My suspicion is that patient is having acidosis both due to her CKD as well as from her diarrhea.  I will check urinalysis and urine electrolytes.  Treat the patient with sodium bicarbonate.

## 2022-12-04 NOTE — Assessment & Plan Note (Addendum)
Patient was diagnosed with sepsis associated with pneumonia on November 18, 2022.  With CT chest showing present tree-in-bud opacities in the right lower lobe as well as pulmonary nodule.  Patient has chronic use of systemic steroids.  Patient was treated with Rocephin and doxycycline.  And patient finished outpatient antibiotic after discharge on October 18.  Patient currently presenting with worsening fatigue, persistent shortness of breath sensation since that time of last hospitalization.  CAT scan has been repeated.  Reviewed by me and compared with images acquired on October 15.  Overall no focal consolidation with air bronchograms as is appreciated. Minimal basilar atelectasis vs. Possible consolidation of bialteral bases is better than the prior images and may represent resolving pneumonia. Airway appear patent and without foreign material. Radiology review advises that there is new cluster of solid nodules in the lingula, mucous plugging in the right lower lobe.  Patient getting 1 dose of Zosyn in the ER, no associated leukocytosis no fever.  I will check a procalcitonin, I will treat the patient with Mucomyst inhalation.  My concern is that this may likely represent progression of patient's metastatic breast ca. Infectious eitiology of lung is felt to be rather low probability in this circumstance.  T[place]cypna is atributable to aptient metabolic acidosis and will get an ABG to ascertain same. 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 Will check swallow eval as well as covid and resp viral panel.

## 2022-12-04 NOTE — Progress Notes (Signed)
Abg reviewed will treat with sodium bicarbonate iv and PO.  Will hold off on mucomyst therapy based on CT chest review for this evening.

## 2022-12-04 NOTE — Assessment & Plan Note (Signed)
Diagnosis, patient's antihypertensive regimen was tapered down the last admission.  At this time I will hold patient's ARB.  Pending clinical course

## 2022-12-04 NOTE — Progress Notes (Signed)
A consult was received from an ED physician for piperacillin/tazobactam per pharmacy dosing. The patient's profile has been reviewed for ht/wt/allergies/indication/available labs.    Pt has filled amoxicillin outpatient in June and August of 2024.  A one time order has been placed for piperacillin/tazobactam 3.375 g IV once to be infused over 30 minutes.    Further antibiotics/pharmacy consults should be ordered by admitting physician if indicated.                       Thank you, Cindi Carbon, PharmD 12/04/2022  8:34 PM

## 2022-12-04 NOTE — Assessment & Plan Note (Signed)
Low threshold for stress dose steroids. Blood culture pending.

## 2022-12-04 NOTE — ED Provider Notes (Signed)
[facility] AT [facility] [address] Provider Note   CSN: [id number] Arrival date & time: 12/04/22  1510     History  Chief Complaint  Patient presents with   Shortness of Breath    Stacey [name] is a 82 y.o. female.  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  Pt was admitted from 10/15-10/18 for sepsis due to CAP.  She was d/c with omnicef and doxy.  She called EMS and RA O2 sat was in the upper 80s, so she was put on 2L.  No fevers.  + chills.  Pt has also noticed some tremors in her hands.  Pt's stepdaughter said she had vomiting all night last night.  She has also had a lot of diarrhea from her breast cancer meds.  She has not eaten anything today.       Home Medications Prior to Admission medications   Medication Sig Start Date End Date Taking? Authorizing Provider  acetaminophen (TYLENOL) 500 MG tablet Take 1,000 mg by mouth See admin instructions. Take 2 tablets (1000 mg) by mouth daily at bedtime, may also take 2 tablets (1000 mg) two more times during the day or night as needed for pain    [provider]  anastrozole (ARIMIDEX) 1 MG tablet TAKE 1 TABLET(1 MG) BY MOUTH DAILY 09/21/22   Loa Socks, NP  aspirin 81 MG EC tablet Take 81 mg by mouth every morning.    [provider]  CALCIUM CARBONATE-VITAMIN D PO Take 1 tablet by mouth 2 (two) times daily.    [provider]  Cholecalciferol (VITAMIN D3 PO) Take 1 capsule by mouth every morning.    [provider]  dexamethasone (DECADRON) 4 MG tablet Take 0.5 tablets (2 mg total) by mouth daily. 10/23/22   Loa Socks, NP  diphenhydrAMINE (BENADRYL) 25 MG tablet Take 25 mg by mouth every 6 (six) hours as needed (for bee stings).     [provider]  fulvestrant (FASLODEX) 250 MG/5ML injection Inject 250 mg into the muscle every 28  (twenty-eight) days. 11/23/19   [provider]  loperamide (IMODIUM) 2 MG capsule 2-4 mg See admin instructions. Take 2 tablets (4 mg) by mouth for first loose stool, then 1 tablet (2 mg) for every loose stool thereafter. Do not exceed 8 tablets (16 mg) in a 24 hour period. Stop loperamide if 12 hours have passed without a loose stool. 09/27/20   Magrinat, Valentino Hue, MD  loratadine (CLARITIN) 10 MG tablet Take 10 mg by mouth every morning.    [provider]  Magnesium 500 MG CAPS Take 500 mg by mouth every morning. Take with a 250 mg capsule for a total dose of 750 mg daily    [provider]  MAGNESIUM PO Take 250 mg by mouth every morning. Take with a 500 mg capsule for a total dose of 750 mg daily    [provider]  Multiple Vitamin (MULTIVITAMIN WITH MINERALS) TABS tablet Take 1 tablet by mouth every morning. Centrum    [provider]  omeprazole (PRILOSEC) 20 MG capsule Take 1 capsule by mouth daily 09/14/22   [name], MD  palbociclib Ilda Foil) 100 MG tablet Take 1 tablet (100 mg total) by mouth Monday through Friday. Take as directed by MD. 03/11/22   Rachel Moulds, MD  prochlorperazine (COMPAZINE) 10 MG tablet Take  1 tablet (10 mg total) by mouth every 6 (six) hours as needed for nausea or vomiting. 06/26/19   Magrinat, Valentino Hue, MD  rivaroxaban (XARELTO) 20 MG TABS tablet Take 1 tablet (20 mg total) by mouth daily with supper. 05/13/22   Loa Socks, NP  telmisartan (MICARDIS) 40 MG tablet Take 1 tablet (40 mg total) by mouth every morning. 09/14/22   [name], MD  triamcinolone (NASACORT) 55 MCG/ACT AERO nasal inhaler Place 2 sprays into the nose daily. 2 sprays each nostril at night before bedtime Patient taking differently: Place 2 sprays into the nose See admin instructions. Instill 2 sprays into each nostril every night before bedtime 10/14/20   Drema Halon, MD  vitamin B-12 (CYANOCOBALAMIN) 100 MCG tablet Take 100  mcg by mouth daily.    [provider]  zolpidem (AMBIEN) 10 MG tablet Take 1 tablet (10 mg total) by mouth at bedtime as needed for sleep. 10/07/22   [name], MD      Allergies    Aminoglycosides, Bacitracin, Bee venom, Cephalexin, Clindamycin/lincomycin, and Codeine    Review of Systems   Review of Systems  Respiratory:  Positive for shortness of breath.   All other systems reviewed and are negative.   Physical Exam Updated Vital Signs BP (!) 141/64 (BP Location: Right Arm)   Pulse 92   Temp 98.3 F (36.8 C) (Oral)   Resp (!) 27   SpO2 100%  Physical Exam Vitals and nursing note reviewed.  Constitutional:      Appearance: She is well-developed.  HENT:     Head: Normocephalic and atraumatic.     Mouth/Throat:     Mouth: Mucous membranes are moist.     Pharynx: Oropharynx is clear.  Eyes:     Extraocular Movements: Extraocular movements intact.     Pupils: Pupils are equal, round, and reactive to light.  Cardiovascular:     Rate and Rhythm: Normal rate and regular rhythm.  Pulmonary:     Effort: Tachypnea present.  Abdominal:     General: Bowel sounds are normal.     Palpations: Abdomen is soft.  Musculoskeletal:        General: Normal range of motion.     Cervical back: Normal range of motion and neck supple.     Right lower leg: Edema present.     Left lower leg: Edema present.     Comments: Left arm lymphedema (chronic)  Skin:    General: Skin is warm.     Capillary Refill: Capillary refill takes less than 2 seconds.  Neurological:     General: No focal deficit present.     Mental Status: She is alert and oriented to person, place, and time.  Psychiatric:        Mood and Affect: Mood normal.        Behavior: Behavior normal.     ED Results / Procedures / Treatments   Labs (all labs ordered are listed, but only abnormal results are displayed) Labs Reviewed  BASIC METABOLIC PANEL - Abnormal; Notable for the following components:      Result  Value   Potassium 3.4 (*)    Chloride 112 (*)    CO2 17 (*)    Glucose, Bld 110 (*)    Creatinine, Ser 1.46 (*)    Calcium 6.3 (*)    GFR, Estimated 36 (*)    All other components within normal limits  BRAIN NATRIURETIC PEPTIDE - Abnormal; Notable  for the following components:   B Natriuretic Peptide 164.7 (*)    All other components within normal limits  CBC WITH DIFFERENTIAL/PLATELET - Abnormal; Notable for the following components:   WBC 3.4 (*)    RBC 2.35 (*)    Hemoglobin 8.3 (*)    HCT 25.2 (*)    MCV 107.2 (*)    MCH 35.3 (*)    RDW 15.8 (*)    Lymphs Abs 0.6 (*)    All other components within normal limits  HEPATIC FUNCTION PANEL - Abnormal; Notable for the following components:   Albumin 3.0 (*)    All other components within normal limits  TROPONIN I (HIGH SENSITIVITY) - Abnormal; Notable for the following components:   Troponin I (High Sensitivity) 29 (*)    All other components within normal limits  TROPONIN I (HIGH SENSITIVITY) - Abnormal; Notable for the following components:   Troponin I (High Sensitivity) 30 (*)    All other components within normal limits  CULTURE, BLOOD (ROUTINE X 2)  CULTURE, BLOOD (ROUTINE X 2)  I-STAT CG4 LACTIC ACID, ED  I-STAT CG4 LACTIC ACID, ED    EKG EKG Interpretation Date/Time:  Friday December 04 2022 15:26:58 EDT Ventricular Rate:  93 PR Interval:  145 QRS Duration:  99 QT Interval:  363 QTC Calculation: 452 R Axis:   -55  Text Interpretation: Sinus rhythm LAD, consider left anterior fascicular block Borderline repolarization abnormality No significant change since last tracing Confirmed by Jacalyn Lefevre [id number]) on 12/04/2022 4:17:44 PM  Radiology CT Angio Chest PE W and/or Wo Contrast  Result Date: 12/04/2022 CLINICAL DATA:  Shortness of breath EXAM: CT ANGIOGRAPHY CHEST WITH CONTRAST TECHNIQUE: Multidetector CT imaging of the chest was performed using the standard protocol during bolus administration of intravenous  contrast. Multiplanar CT image reconstructions and MIPs were obtained to evaluate the vascular anatomy. RADIATION DOSE REDUCTION: This exam was performed according to the departmental dose-optimization program which includes automated exposure control, adjustment of the mA and/or kV according to patient size and/or use of iterative reconstruction technique. CONTRAST:  75mL OMNIPAQUE IOHEXOL 350 MG/ML SOLN COMPARISON:  CTA chest dated November 17, 2022 FINDINGS: Cardiovascular: No evidence of pulmonary embolus. Normal heart size. No pericardial effusion. Normal caliber thoracic aorta with severe atherosclerotic disease. Moderate coronary artery calcifications. Mediastinum/Nodes: Moderate hiatal hernia. Thyroid is unremarkable. No enlarged lymph nodes seen in the chest. Lungs/Pleura: Central airways are patent. Moderate emphysema. Subpleural reticulations of the anterior right lung, consistent with postradiation change. Mild bilateral bronchial wall thickening. Unchanged clustered nodules and mucous plugging of the right lower lobe. New clustered solid nodule seen in the lingula. Reference new nodule measuring 4 mm on series 12, image 64. No pleural effusion or pneumothorax. Upper Abdomen: No acute abnormality. Musculoskeletal: Postsurgical changes of the right breast. No aggressive appearing osseous lesions. Review of the MIP images confirms the above findings. IMPRESSION: 1. No evidence of pulmonary embolus. 2. Mild bilateral bronchial wall thickening, consistent with bronchitis. 3. New clustered solid nodules seen in the lingula and unchanged clustered nodules and mucous plugging of the right lower lobe, likely due to infection or aspiration. Recommend follow-up in 3 months to ensure resolution. 4. Moderate hiatal hernia. 5. Aortic Atherosclerosis (ICD10-I70.0) and Emphysema (ICD10-J43.9). Electronically Signed   By: Allegra Lai M.D.   On: 12/04/2022 19:40   DG Chest Port 1 View  Result Date:  12/04/2022 CLINICAL DATA:  Shortness of breath EXAM: PORTABLE CHEST 1 VIEW COMPARISON:  11/17/2022 FINDINGS: Heart and  mediastinal contours within normal limits. No confluent airspace opacities or effusions. No acute bony abnormality. IMPRESSION: No active disease. Electronically Signed   By: Charlett Nose M.D.   On: 12/04/2022 18:26    Procedures Procedures    Medications Ordered in ED Medications  iohexol (OMNIPAQUE) 350 MG/ML injection 75 mL (75 mLs Intravenous Contrast Given 12/04/22 1751)  calcium gluconate 1 g/ 50 mL sodium chloride IVPB (0 mg Intravenous Stopped 12/04/22 1912)  sodium chloride 0.9 % bolus 500 mL (0 mLs Intravenous Stopped 12/04/22 1912)    ED Course/ Medical Decision Making/ A&P                                 Medical Decision Making Amount and/or Complexity of Data Reviewed Labs: ordered. Radiology: ordered.  Risk Prescription drug management. Decision regarding hospitalization.   This patient presents to the ED for concern of sob, this involves an extensive number of treatment options, and is a complaint that carries with it a high risk of complications and morbidity.  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  The pertinent results include:  cbc with hgb 8.3 (hgb 9.6 2 weeks ago), istat lactic 1.1 and 0.6, lfts nl, bnp elevated at 164.7, bmp with CO2 low at 17, cr elevated at 1.46 (cr 1.19 on 10/18), calcium low at 6.3   Imaging Studies ordered:  I ordered imaging studies including cxr and ct chest  I independently visualized and interpreted imaging which showed  CXR: No  active disease.  CT chest:  No evidence of pulmonary embolus.  2. Mild bilateral bronchial wall thickening, consistent with  bronchitis.  3. New clustered solid nodules seen in the lingula and unchanged  clustered nodules and mucous plugging of the right lower lobe,  likely due to infection or aspiration. Recommend follow-up in 3  months to ensure resolution.  4. Moderate hiatal hernia.  5. Aortic Atherosclerosis (ICD10-I70.0) and Emphysema (ICD10-J43.9).   I agree with the radiologist interpretation   Cardiac Monitoring:  The patient was maintained on a cardiac monitor.  I personally viewed and interpreted the cardiac monitored which showed an underlying rhythm of: nsr   Medicines ordered and prescription drug management:  I ordered medication including ivfs/iv ca  for sx  Reevaluation of the patient after these medicines showed that the patient improved I have reviewed the patients home medicines and have made adjustments as needed   Test Considered:  ct   Critical Interventions:  Oxygen, calcium   Consultations Obtained:  I requested consultation with the hospitalists (Dr. Maryjean Ka),  and discussed lab and imaging findings as well as pertinent plan - he will admit   Problem List / ED Course:  Hypocalcemia:  Ca gluconate given iv ? Aspiration pna:  pt has been on omnicef and doxy.  IV zosyn ordered. Hypoxia:  pt placed on 2L via [place] and oxygen has increased to the mid-90s. Anemia: chronic   Reevaluation:  After the interventions noted above, I reevaluated the patient and found that they have :improved   Social Determinants of Health:  Lives at home  Dispostion:  After consideration of the diagnostic results and the patients response to treatment, I feel that the patent would benefit from admission.    CRITICAL CARE Performed by: Jacalyn Lefevre   Total critical care time: 30 minutes  Critical care time was exclusive of separately billable procedures  and treating other patients.  Critical care was necessary to treat or prevent imminent or life-threatening deterioration.  Critical care was time spent personally by me on the following activities: development of treatment plan with patient and/or surrogate as well as nursing, discussions with consultants, evaluation of patient's response to treatment, examination of patient, obtaining history from patient or surrogate, ordering and performing treatments and interventions, ordering and review of laboratory studies, ordering and review of radiographic studies, pulse oximetry and re-evaluation of patient's condition.         Final Clinical Impression(s) / ED Diagnoses Final diagnoses:  Acute respiratory failure with hypoxia (HCC)  Nausea vomiting and diarrhea  Anemia, unspecified type  Metastasis from breast cancer (HCC)  Hypocalcemia  Aspiration pneumonia of right lower lobe, unspecified aspiration pneumonia type [facility])    Rx / DC Orders ED Discharge Orders     None         Jacalyn Lefevre, MD 12/04/22 2027

## 2022-12-04 NOTE — Assessment & Plan Note (Signed)
,   Attributed to Ibrance, up to 10 bowel movements a day.  Check C. difficile.

## 2022-12-04 NOTE — Assessment & Plan Note (Signed)
Severe hypocalcemia is noted, patient has received 1 g of calcium gluconate in the ER.  Patient is not reporting much symptomatic improvement however in terms of her fatigue and generalized weakness.  I will go ahead and check a repeat calcium level with albumin, ionized calcium phosphate PTH and magnesium and vitamin D level.  Replete as needed.  Maintain on telemetry.  No EKG changes are noted

## 2022-12-04 NOTE — Assessment & Plan Note (Signed)
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   #2 status post 4 cycles of doxorubicin and cyclophosphamide,                          (i) followed by radiation therapy under the care of Dr. [name].   #3 received tamoxifen for a total of seven years    RIGHT BREAST #4  S/P biopsy of the RIGHT breast upper inner quadrant on 11/14/10 showing invasive ductal carcinoma,, grade 2, estrogen receptor 85% and progesterone receptor 57% positive, Ki67 20%, HER2 not amplified.     #5 started neoadjuvant letrozole in November 2012; switched to tamoxifen as of February 2016 due to osteopenia concerns   #6  S/P right lumpectomy with sentinel lymph node biopsy on 09/03/11 for a ypT2, ypN1a, stage IIB invasive lobular carcinoma, grade 2,estrogen receptor 97% positive, progesterone receptor 12% positive, with no HER-2 amplification.   #7 status post right breast radiation therapy under the care of Dr. [name] from 10/19/2011 to 12/03/2011.    #8 did not meet criteria for genetic testing according to her insurance company.   #9 osteopenia, with a T score of -1.8 on DEXA scan at [facility] 03/07/2013             (a) status post multiple dental extractions and implants             (b) repeat bone density at [facility] 04/02/2015 shows a T score of -2.0   METASTATIC DISEASE: April 2021 (lymph nodes, bone)   #10:  left upper extremity lymphedema led to chest CT scan 05/09/2019 showing a 5.1 cm left subpectoral chest wall mass and thoracic lymphadenopathy              (a) CT biopsy of the chest wall mass 05/25/2019 confirms recurrent breast cancer, strongly estrogen and progesterone receptor positive, HER-2 not amplified             (b) PET scan 05/30/2019 shows a left  subpectoral mass measuring 5.4 cm, with significant regional and mediastinal adenopathy, sclerotic left scapular metastasis, but no liver or lung involvement.             (c) CA 27-29 is moderately informative   #11 anastrozole started 06/02/2019; palbociclib added 06/08/2019             (a) palbociclib taken irregularly for several months secondary to cytopenias             (b) palbociclib dose decreased to 100 mg daily 21 on 7 off October 2021             (c) CT of the chest with contrast 11/09/2019 shows mild disease progression (despite a continuing drop in the CA 27-29)             (d) fulvestrant added 11/23/2019             (e) repeat chest CT with contrast 09/09/2020 read as stable.   #12 denosumab/Xgeva starting 06/08/2019, to be repeated every 3 months due to hypocalcemia             (a) treatment held after August 2022 dose secondary to ongoing dental issues   #13 genetics testing  06/15/2019 through the [employer] Common Hereditary Cancers Panel found no deleterious mutations in APC, ATM, AXIN2, BARD1, BMPR1A, BRCA1, BRCA2, BRIP1, CDH1, CDKN2A (p14ARF), CDKN2A (p16INK4a), CKD4, CHEK2, CTNNA1, DICER1, EPCAM (Deletion/duplication testing only), GREM1 (promoter region deletion/duplication testing only), KIT, MEN1, MLH1, MSH2, MSH3, MSH6, MUTYH, NBN, NF1, NHTL1, PALB2, PDGFRA, PMS2, POLD1, POLE, PTEN, RAD50, RAD51C, RAD51D, RNF43, SDHB, SDHC, SDHD, SMAD4, SMARCA4. STK11, TP53, TSC1, TSC2, and VHL.  The following genes were evaluated for sequence changes only: SDHA and HOXB13 c.251G>A variant only.   CURRENT THERAPY: Anastrozole Fulvestrant Palbociclib

## 2022-12-04 NOTE — Assessment & Plan Note (Addendum)
This is the most prominent complaint of the patient, I would suggest that this is really the chief complaint.  This has been ongoing since the last discharge.  At this time I will check thyroid cascade, cortisol level, elevated BNP necessitates an echo to make sure were not dealing with a subacute congestive heart failure with a euvolemic status.  Electrolyte abnormalities may be contributing the to this and will be corrected.  Troponin elevation is felt to be chronic and nonactionable and not suggestive of acute coronary syndrome. Patient anti htn regiment was stopped in last discharge.

## 2022-12-04 NOTE — ED Triage Notes (Signed)
Pt BIB EMS from Home due to shortness of Breath. Pt c/o SOB, hand tremors, chills w/o fever for the past few days. Pt was hospitalized 2 weeks ago for sepsis. Hx of DVT.  In route 4 L of O2 BP 143/70 RR 18 SpO2 100% ETCO2 18 CBG 124 Temp 97.9

## 2022-12-04 NOTE — H&P (Addendum)
History and Physical    Patient: Stacey [name] XBM:[id number] DOB: Apr 05, 1940 DOA: 12/04/2022 DOS: the patient was seen and examined on 12/04/2022 PCP: [name], MD  Patient coming from: Home  Chief Complaint:  Chief Complaint  Patient presents with   Shortness of Breath   HPI: Stacey [name] is a 82 y.o. female with medical history significant of metastaic breast ca for which patien is on  ibrance. However no iv chemotherapy.  Patient was discharged from the [facility] health system on October 18, approximately 2 weeks ago after treatment for sepsis secondary to pneumonia that was present on admission affecting the right lower lobe.  However patient states that when she was discharged she finished her antibiotics.  She has continued to have some sensation of shortness of breath, and generalized weakness since then.  This is present when she attempts to exert herself such as walk.  Which has become increasingly difficult.  Patient does not distinguish between sensation of fatigue or generalized weakness versus shortness of breath.  Patient certainly does not have any chest pain or vomiting or fever or palpitation or syncope or fall.  Patient does have a dry cough that is rather minimal per patient.  Patient apparently was so weak this morning that she could not get out of bed prompting her stepdaughter to seek EMS help to bring the patient to the hospital.  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  Patient takes Imodium occasionally for that.  Further when EMS got there, patient was put on 2 L/min of supplementary oxygen, patient may have been hypoxic, although this has not been clearly documented.  During this encounter I took the patient off of supplementary oxygen and patient has maintained sats over 90%.  As of my encounter with the patient patient pending zosyn.  Cultures have been drawn. Medical eval is sought. Review of  Systems: As mentioned in the history of present illness. All other systems reviewed and are negative. Past Medical History:  Diagnosis Date   ANEMIA-NOS    ANXIETY    Breast cancer (HCC) 1997 L, 2012 R   s/p chemo/xrt   COPD    resolved   DIVERTICULOSIS, COLON 2008   Dizziness    Family history of breast cancer    Family history of lung cancer    Family history of lymphoma    Family history of pancreatic cancer    Family history of uterine cancer    GERD    Hx of radiation therapy 10/19/11 -12/03/11   right breast   HYPERLIPIDEMIA    IRRITABLE BOWEL SYNDROME, HX OF    Left-sided carotid artery disease (HCC)    moderate left ICA stenosis   OSTEOARTHRITIS, HAND    PSVT (paroxysmal supraventricular tachycardia) (HCC)    symptomatic on event monitor   Past Surgical History:  Procedure Laterality Date   ABDOMINAL HYSTERECTOMY     APPENDECTOMY     BREAST BIOPSY  11/14/10    r breast: inv, insitu mammary carcinoma w/calcif, er/pr +, her2 -   BREAST SURGERY     lumpectomy   CATARACT EXTRACTION     both eyes   ELECTROPHYSIOLOGIC STUDY N/A 05/10/2015   Procedure: SVT Ablation;  Surgeon: Will Jorja Loa, MD;  Location: [facility] INVASIVE CV LAB;  Service: Cardiovascular;  Laterality: N/A;   HERNIA REPAIR     inguinal herniorrhapy  1984   left   IR US  GUIDE BX ASP/DRAIN  05/26/2019   rectal fissure repair     s/p benign breast biopsy  2003   right   s/p left foot surgury  2009   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   spiral fx left foot  2008   no surgury   TMJ ARTHROPLASTY  1989   TONGUE SURGERY     1988- to remove scar tissue growth    TONSILLECTOMY     Social History:  reports that she quit smoking about 6 years ago. Her smoking use included cigarettes. She started smoking about 60 years ago. She has a 54 pack-year smoking history. She has never used smokeless tobacco. She reports current alcohol use. She reports that she does not use drugs.  Allergies  Allergen  Reactions   Aminoglycosides Other (See Comments)    Unknown reaction - pt is not sure where this entry came from   Bacitracin Other (See Comments)   Bee Venom Swelling    Severe body swelling   Cephalexin Other (See Comments)   Clindamycin/Lincomycin Diarrhea and Nausea And Vomiting   Codeine Nausea And Vomiting    Pt can take codeine cough syrup.    Family History  Problem Relation Age of Onset   Hypertension Mother    Stroke Mother    Colon polyps Mother    Diabetes Mother    Pancreatic cancer Mother 78   Uterine cancer Mother 4   Lymphoma Brother        burkitts   Lung cancer Paternal Uncle    Lung cancer Maternal Grandmother 64       non-smoker   Lung cancer Maternal Grandfather    Breast cancer Cousin        maternal cousin, dx in her mid 60s   Brain cancer Cousin        maternal cousin's son; dx in his 70s   Testicular cancer Cousin        maternal cousin's son;    Breast cancer Cousin        paternal cousin; dx in her 12s   Breast cancer Cousin        paternal cousin's daughter; dx in 44s; neg genetic testing   Colon cancer Neg Hx    Esophageal cancer Neg Hx    Stomach cancer Neg Hx    Rectal cancer Neg Hx     Prior to Admission medications   Medication Sig Start Date End Date Taking? Authorizing Provider  acetaminophen (TYLENOL) 500 MG tablet Take 1,000 mg by mouth See admin instructions. Take 2 tablets (1000 mg) by mouth daily at bedtime, may also take 2 tablets (1000 mg) two more times during the day or night as needed for pain    [provider]  anastrozole (ARIMIDEX) 1 MG tablet TAKE 1 TABLET(1 MG) BY MOUTH DAILY 09/21/22   Loa Socks, NP  aspirin 81 MG EC tablet Take 81 mg by mouth every morning.    [provider]  CALCIUM CARBONATE-VITAMIN D PO Take 1 tablet by mouth 2 (two) times daily.    [provider]  Cholecalciferol (VITAMIN D3 PO) Take 1 capsule by mouth every morning.    [provider]   dexamethasone (DECADRON) 4 MG tablet Take 0.5 tablets (2 mg total) by mouth daily. 10/23/22   Loa Socks, NP  diphenhydrAMINE (BENADRYL) 25 MG tablet Take 25 mg by mouth every 6 (six) hours as needed (for bee stings).  [provider]  fulvestrant (FASLODEX) 250 MG/5ML injection Inject 250 mg into the muscle every 28 (twenty-eight) days. 11/23/19   [provider]  loperamide (IMODIUM) 2 MG capsule 2-4 mg See admin instructions. Take 2 tablets (4 mg) by mouth for first loose stool, then 1 tablet (2 mg) for every loose stool thereafter. Do not exceed 8 tablets (16 mg) in a 24 hour period. Stop loperamide if 12 hours have passed without a loose stool. 09/27/20   Magrinat, Valentino Hue, MD  loratadine (CLARITIN) 10 MG tablet Take 10 mg by mouth every morning.    [provider]  Magnesium 500 MG CAPS Take 500 mg by mouth every morning. Take with a 250 mg capsule for a total dose of 750 mg daily    [provider]  MAGNESIUM PO Take 250 mg by mouth every morning. Take with a 500 mg capsule for a total dose of 750 mg daily    [provider]  Multiple Vitamin (MULTIVITAMIN WITH MINERALS) TABS tablet Take 1 tablet by mouth every morning. Centrum    [provider]  omeprazole (PRILOSEC) 20 MG capsule Take 1 capsule by mouth daily 09/14/22   [name], MD  palbociclib Ilda Foil) 100 MG tablet Take 1 tablet (100 mg total) by mouth Monday through Friday. Take as directed by MD. 03/11/22   Rachel Moulds, MD  prochlorperazine (COMPAZINE) 10 MG tablet Take 1 tablet (10 mg total) by mouth every 6 (six) hours as needed for nausea or vomiting. 06/26/19   Magrinat, Valentino Hue, MD  rivaroxaban (XARELTO) 20 MG TABS tablet Take 1 tablet (20 mg total) by mouth daily with supper. 05/13/22   Loa Socks, NP  telmisartan (MICARDIS) 40 MG tablet Take 1 tablet (40 mg total) by mouth every morning. 09/14/22   [name], MD  triamcinolone (NASACORT)  55 MCG/ACT AERO nasal inhaler Place 2 sprays into the nose daily. 2 sprays each nostril at night before bedtime Patient taking differently: Place 2 sprays into the nose See admin instructions. Instill 2 sprays into each nostril every night before bedtime 10/14/20   Drema Halon, MD  vitamin B-12 (CYANOCOBALAMIN) 100 MCG tablet Take 100 mcg by mouth daily.    [provider]  zolpidem (AMBIEN) 10 MG tablet Take 1 tablet (10 mg total) by mouth at bedtime as needed for sleep. 10/07/22   [name], MD   Patient actually has not been taking her Xarelto at home for the last 10 days or so because she has been forgetting about it Physical Exam: Vitals:   12/04/22 1815 12/04/22 1817 12/04/22 1930 12/04/22 2000  BP: (!) 127/97  (!) 141/64 (!) 141/62  Pulse: 96  92 91  Resp: (!) 38  (!) 27 (!) 27  Temp:  98.3 F (36.8 C)    TempSrc:  Oral    SpO2: 97%  100% 98%   General: Fair frail appearing lady, on room air saturating well over 90% consistently.  Patient gives a coherent account of her symptoms.  Sudden some tachypnea up to 28/min is appreciated.  Otherwise no distress apparent.  Patient is complaining of fatigue. Respiratory exam: Bilateral intravesicular some diminishment of breath sounds.  Resonant to percussion Cardiovascular exam S1-S2 normal Abdomen all quadrants are soft and nontender Extremities warm without edema Data Reviewed:  Labs on Admission:  Results for orders placed or performed during the hospital encounter of 12/04/22 (from the past 24 hour(s))  Basic metabolic panel  Status: Abnormal   Collection Time: 12/04/22  3:53 PM  Result Value Ref Range   Sodium 142 135 - 145 mmol/L   Potassium 3.4 (L) 3.5 - 5.1 mmol/L   Chloride 112 (H) 98 - 111 mmol/L   CO2 17 (L) 22 - 32 mmol/L   Glucose, Bld 110 (H) 70 - 99 mg/dL   BUN 20 8 - 23 mg/dL   Creatinine, Ser 6.57 (H) 0.44 - 1.00 mg/dL   Calcium 6.3 (LL) 8.9 - 10.3 mg/dL   GFR, Estimated 36 (L) >60 mL/min    Anion gap 13 5 - 15  Brain natriuretic peptide     Status: Abnormal   Collection Time: 12/04/22  3:53 PM  Result Value Ref Range   B Natriuretic Peptide 164.7 (H) 0.0 - 100.0 pg/mL  CBC with Differential     Status: Abnormal   Collection Time: 12/04/22  3:53 PM  Result Value Ref Range   WBC 3.4 (L) 4.0 - 10.5 K/uL   RBC 2.35 (L) 3.87 - 5.11 MIL/uL   Hemoglobin 8.3 (L) 12.0 - 15.0 g/dL   HCT 84.6 (L) 96.2 - 95.2 %   MCV 107.2 (H) 80.0 - 100.0 fL   MCH 35.3 (H) 26.0 - 34.0 pg   MCHC 32.9 30.0 - 36.0 g/dL   RDW 84.1 (H) 32.4 - 40.1 %   Platelets 185 150 - 400 K/uL   nRBC 0.0 0.0 - 0.2 %   Neutrophils Relative % 73 %   Neutro Abs 2.5 1.7 - 7.7 K/uL   Lymphocytes Relative 17 %   Lymphs Abs 0.6 (L) 0.7 - 4.0 K/uL   Monocytes Relative 7 %   Monocytes Absolute 0.3 0.1 - 1.0 K/uL   Eosinophils Relative 1 %   Eosinophils Absolute 0.0 0.0 - 0.5 K/uL   Basophils Relative 1 %   Basophils Absolute 0.0 0.0 - 0.1 K/uL   Immature Granulocytes 1 %   Abs Immature Granulocytes 0.04 0.00 - 0.07 K/uL  Hepatic function panel     Status: Abnormal   Collection Time: 12/04/22  3:53 PM  Result Value Ref Range   Total Protein 6.7 6.5 - 8.1 g/dL   Albumin 3.0 (L) 3.5 - 5.0 g/dL   AST 22 15 - 41 U/L   ALT 14 0 - 44 U/L   Alkaline Phosphatase 46 38 - 126 U/L   Total Bilirubin 0.8 0.3 - 1.2 mg/dL   Bilirubin, Direct <0.2 0.0 - 0.2 mg/dL   Indirect Bilirubin NOT CALCULATED 0.3 - 0.9 mg/dL  Troponin I (High Sensitivity)     Status: Abnormal   Collection Time: 12/04/22  3:53 PM  Result Value Ref Range   Troponin I (High Sensitivity) 29 (H) <18 ng/L  I-Stat CG4 Lactic Acid     Status: None   Collection Time: 12/04/22  4:09 PM  Result Value Ref Range   Lactic Acid, Venous 1.1 0.5 - 1.9 mmol/L  I-Stat CG4 Lactic Acid     Status: None   Collection Time: 12/04/22  5:45 PM  Result Value Ref Range   Lactic Acid, Venous 0.6 0.5 - 1.9 mmol/L  Troponin I (High Sensitivity)     Status: Abnormal   Collection  Time: 12/04/22  6:12 PM  Result Value Ref Range   Troponin I (High Sensitivity) 30 (H) <18 ng/L   *Note: Due to a large number of results and/or encounters for the requested time period, some results have not been displayed. A complete set of results  can be found in Results Review.   Basic Metabolic Panel: Recent Labs  Lab 12/04/22 1553  NA 142  K 3.4*  CL 112*  CO2 17*  GLUCOSE 110*  BUN 20  CREATININE 1.46*  CALCIUM 6.3*   Liver Function Tests: Recent Labs  Lab 12/04/22 1553  AST 22  ALT 14  ALKPHOS 46  BILITOT 0.8  PROT 6.7  ALBUMIN 3.0*   No results for input(s): "LIPASE", "AMYLASE" in the last 168 hours. No results for input(s): "AMMONIA" in the last 168 hours. CBC: Recent Labs  Lab 12/04/22 1553  WBC 3.4*  NEUTROABS 2.5  HGB 8.3*  HCT 25.2*  MCV 107.2*  PLT 185   Cardiac Enzymes: Recent Labs  Lab 12/04/22 1553 12/04/22 1812  TROPONINIHS 29* 30*    BNP (last 3 results) No results for input(s): "PROBNP" in the last 8760 hours. CBG: No results for input(s): "GLUCAP" in the last 168 hours.  Radiological Exams on Admission:  CT Angio Chest PE W and/or Wo Contrast  Result Date: 12/04/2022 CLINICAL DATA:  Shortness of breath EXAM: CT ANGIOGRAPHY CHEST WITH CONTRAST TECHNIQUE: Multidetector CT imaging of the chest was performed using the standard protocol during bolus administration of intravenous contrast. Multiplanar CT image reconstructions and MIPs were obtained to evaluate the vascular anatomy. RADIATION DOSE REDUCTION: This exam was performed according to the departmental dose-optimization program which includes automated exposure control, adjustment of the mA and/or kV according to patient size and/or use of iterative reconstruction technique. CONTRAST:  75mL OMNIPAQUE IOHEXOL 350 MG/ML SOLN COMPARISON:  CTA chest dated November 17, 2022 FINDINGS: Cardiovascular: No evidence of pulmonary embolus. Normal heart size. No pericardial effusion. Normal  caliber thoracic aorta with severe atherosclerotic disease. Moderate coronary artery calcifications. Mediastinum/Nodes: Moderate hiatal hernia. Thyroid is unremarkable. No enlarged lymph nodes seen in the chest. Lungs/Pleura: Central airways are patent. Moderate emphysema. Subpleural reticulations of the anterior right lung, consistent with postradiation change. Mild bilateral bronchial wall thickening. Unchanged clustered nodules and mucous plugging of the right lower lobe. New clustered solid nodule seen in the lingula. Reference new nodule measuring 4 mm on series 12, image 64. No pleural effusion or pneumothorax. Upper Abdomen: No acute abnormality. Musculoskeletal: Postsurgical changes of the right breast. No aggressive appearing osseous lesions. Review of the MIP images confirms the above findings. IMPRESSION: 1. No evidence of pulmonary embolus. 2. Mild bilateral bronchial wall thickening, consistent with bronchitis. 3. New clustered solid nodules seen in the lingula and unchanged clustered nodules and mucous plugging of the right lower lobe, likely due to infection or aspiration. Recommend follow-up in 3 months to ensure resolution. 4. Moderate hiatal hernia. 5. Aortic Atherosclerosis (ICD10-I70.0) and Emphysema (ICD10-J43.9). Electronically Signed   By: Allegra Lai M.D.   On: 12/04/2022 19:40   DG Chest Port 1 View  Result Date: 12/04/2022 CLINICAL DATA:  Shortness of breath EXAM: PORTABLE CHEST 1 VIEW COMPARISON:  11/17/2022 FINDINGS: Heart and mediastinal contours within normal limits. No confluent airspace opacities or effusions. No acute bony abnormality. IMPRESSION: No active disease. Electronically Signed   By: Charlett Nose M.D.   On: 12/04/2022 18:26    EKG: Independently reviewed. No St-t wave changes.   Assessment and Plan: Diarrhea , Attributed to Ibrance, up to 10 bowel movements a day.  Check C. difficile.  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 Blood culture pending.  Pneumonia  Patient was diagnosed with sepsis associated with pneumonia on November 18, 2022.  With CT chest showing present tree-in-bud opacities in the right lower lobe as well as pulmonary nodule.  Patient has chronic use of systemic steroids.  Patient was treated with Rocephin and doxycycline.  And patient finished outpatient antibiotic after discharge on October 18.  Patient currently presenting with worsening fatigue, persistent shortness of breath sensation since that time of last hospitalization.  CAT scan has been repeated.  Reviewed by me and compared with images acquired on October 15.  Overall no focal consolidation with air bronchograms as is appreciated. Minimal basilar atelectasis vs. Possible consolidation of bialteral bases is better than the prior images and may represent resolving pneumonia. Airway appear patent and without foreign material. Radiology review advises that there is new cluster of solid nodules in the lingula, mucous plugging in the right lower lobe.  Patient getting 1 dose of Zosyn in the ER, no associated leukocytosis no fever.  I will check a procalcitonin, I will treat the patient with Mucomyst inhalation.  My concern is that this may likely represent progression of patient's metastatic breast ca. Infectious eitiology of lung is felt to be rather low probability in this circumstance.  [place] is atributable to aptient metabolic acidosis and will get an ABG to ascertain same. 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 Will check swallow eval as well as covid and resp viral panel.  Fatigue This is the most prominent complaint of the patient, I would suggest that this is really the chief complaint.  This has been ongoing since the last discharge.  At this time I  will check thyroid cascade, cortisol level, elevated BNP necessitates an echo to make sure were not dealing with a subacute congestive heart failure with a euvolemic status.  Electrolyte abnormalities may be contributing the to this and will be corrected.  Troponin elevation is felt to be chronic and nonactionable and not suggestive of acute coronary syndrome. Patient anti htn regiment was stopped in last discharge.  Hypocalcemia Severe hypocalcemia is noted, patient has received 1 g of calcium gluconate in the ER.  Patient is not reporting much symptomatic improvement however in terms of her fatigue and generalized weakness.  I will go ahead and check a repeat calcium level with albumin, ionized calcium phosphate PTH and magnesium and vitamin D level.  Replete as needed.  Maintain on telemetry.  No EKG changes are noted  Metabolic acidosis Patient has chronic appearing metabolic acidosis associated with low potassium as well as normal anion gap.  Patient is also noted to have chronic kidney disease.  My suspicion is that patient is having acidosis both due to her CKD as well as from her diarrhea.  I will check urinalysis and urine electrolytes.  Treat the patient with sodium bicarbonate.  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   #2 status post 4 cycles of doxorubicin and cyclophosphamide,                          (i) followed by radiation therapy under the care of Dr. [name].   #  3 received tamoxifen for a total of seven years    RIGHT BREAST #4  S/P biopsy of the RIGHT breast upper inner quadrant on 11/14/10 showing invasive ductal carcinoma,, grade 2, estrogen receptor 85% and progesterone receptor 57%  positive, Ki67 20%, HER2 not amplified.     #5 started neoadjuvant letrozole in November 2012; switched to tamoxifen as of February 2016 due to osteopenia concerns   #6  S/P right lumpectomy with sentinel lymph node biopsy on 09/03/11 for a ypT2, ypN1a, stage IIB invasive lobular carcinoma, grade 2,estrogen receptor 97% positive, progesterone receptor 12% positive, with no HER-2 amplification.   #7 status post right breast radiation therapy under the care of Dr. [name] from 10/19/2011 to 12/03/2011.    #8 did not meet criteria for genetic testing according to her insurance company.   #9 osteopenia, with a T score of -1.8 on DEXA scan at [facility] 03/07/2013             (a) status post multiple dental extractions and implants             (b) repeat bone density at [facility] 04/02/2015 shows a T score of -2.0   METASTATIC DISEASE: April 2021 (lymph nodes, bone)   #10:  left upper extremity lymphedema led to chest CT scan 05/09/2019 showing a 5.1 cm left subpectoral chest wall mass and thoracic lymphadenopathy              (a) CT biopsy of the chest wall mass 05/25/2019 confirms recurrent breast cancer, strongly estrogen and progesterone receptor positive, HER-2 not amplified             (b) PET scan 05/30/2019 shows a left subpectoral mass measuring 5.4 cm, with significant regional and mediastinal adenopathy, sclerotic left scapular metastasis, but no liver or lung involvement.             (c) CA 27-29 is moderately informative   #11 anastrozole started 06/02/2019; palbociclib added 06/08/2019             (a) palbociclib taken irregularly for several months secondary to cytopenias             (b) palbociclib dose decreased to 100 mg daily 21 on 7 off October 2021             (c) CT of the chest with contrast 11/09/2019 shows mild disease progression (despite a continuing drop in the CA 27-29)             (d) fulvestrant added 11/23/2019             (e) repeat chest CT with contrast 09/09/2020 read  as stable.   #12 denosumab/Xgeva starting 06/08/2019, to be repeated every 3 months due to hypocalcemia             (a) treatment held after August 2022 dose secondary to ongoing dental issues   #13 genetics testing 06/15/2019 through the [facility] Common Hereditary Cancers Panel found no deleterious mutations in APC, ATM, AXIN2, BARD1, BMPR1A, BRCA1, BRCA2, BRIP1, CDH1, CDKN2A (p14ARF), CDKN2A (p16INK4a), CKD4, CHEK2, CTNNA1, DICER1, EPCAM (Deletion/duplication testing only), GREM1 (promoter region deletion/duplication testing only), KIT, MEN1, MLH1, MSH2, MSH3, MSH6, MUTYH, NBN, NF1, NHTL1, PALB2, PDGFRA, PMS2, POLD1, POLE, PTEN, RAD50, RAD51C, RAD51D, RNF43, SDHB, SDHC, SDHD, SMAD4, SMARCA4. STK11, TP53, TSC1, TSC2, and VHL.  The following genes were evaluated for sequence changes only: SDHA and HOXB13 c.251G>A variant only.   CURRENT THERAPY: Anastrozole Fulvestrant Palbociclib  HTN (hypertension) Diagnosis, patient's  antihypertensive regimen was tapered down the last admission.  At this time I will hold patient's ARB.  Pending clinical course  Pending home med collection by pharmacy dept. Med list discusse dwiht patinet  and based on last list of DC meds, ordered. Consider getting on c input to restart onc meds.      Advance Care Planning:   Code Status: Prior full per patient  Consults: none at this time. Consider pulm input in AM  Family Communication: per patient.  Severity of Illness: The appropriate patient status for this patient is INPATIENT. Inpatient status is judged to be reasonable and necessary in order to provide the required intensity of service to ensure the patient's safety. The patient's presenting symptoms, physical exam findings, and initial radiographic and laboratory data in the context of their chronic comorbidities is felt to place them at high risk for further clinical deterioration. Furthermore, it is not anticipated that the patient will be medically stable for  discharge from the hospital within 2 midnights of admission.   * I certify that at the point of admission it is my clinical judgment that the patient will require inpatient hospital care spanning beyond 2 midnights from the point of admission due to high intensity of service, high risk for further deterioration and high frequency of surveillance required.*  Author: Nolberto Hanlon, MD 12/04/2022 8:45 PM  For on call review [email or web address].

## 2022-12-04 NOTE — Assessment & Plan Note (Signed)
Is remote and chronic, continue with rivaroxaban

## 2022-12-05 ENCOUNTER — Inpatient Hospital Stay (HOSPITAL_COMMUNITY): Payer: Medicare Other

## 2022-12-05 DIAGNOSIS — I5021 Acute systolic (congestive) heart failure: Secondary | ICD-10-CM | POA: Diagnosis not present

## 2022-12-05 DIAGNOSIS — J9601 Acute respiratory failure with hypoxia: Principal | ICD-10-CM

## 2022-12-05 DIAGNOSIS — J9621 Acute and chronic respiratory failure with hypoxia: Secondary | ICD-10-CM

## 2022-12-05 LAB — BASIC METABOLIC PANEL
Anion gap: 13 (ref 5–15)
BUN: 15 mg/dL (ref 8–23)
CO2: 19 mmol/L — ABNORMAL LOW (ref 22–32)
Calcium: 6.7 mg/dL — ABNORMAL LOW (ref 8.9–10.3)
Chloride: 106 mmol/L (ref 98–111)
Creatinine, Ser: 1.23 mg/dL — ABNORMAL HIGH (ref 0.44–1.00)
GFR, Estimated: 44 mL/min — ABNORMAL LOW (ref >60)
Glucose, Bld: 184 mg/dL — ABNORMAL HIGH (ref 70–99)
Potassium: 3 mmol/L — ABNORMAL LOW (ref 3.5–5.1)
Sodium: 138 mmol/L (ref 135–145)

## 2022-12-05 LAB — URINALYSIS, COMPLETE (UACMP) WITH MICROSCOPIC
Bilirubin Urine: NEGATIVE
Glucose, UA: NEGATIVE mg/dL
Ketones, ur: NEGATIVE mg/dL
Nitrite: NEGATIVE
Protein, ur: NEGATIVE mg/dL
Specific Gravity, Urine: >1.046 — ABNORMAL HIGH (ref 1.005–1.030)
pH: 5 (ref 5.0–8.0)

## 2022-12-05 LAB — PROTIME-INR
INR: 3.2 — ABNORMAL HIGH (ref 0.8–1.2)
Prothrombin Time: 32.7 s — ABNORMAL HIGH (ref 11.4–15.2)

## 2022-12-05 LAB — RESPIRATORY PANEL BY PCR

## 2022-12-05 LAB — COMPREHENSIVE METABOLIC PANEL
ALT: 11 U/L (ref 0–44)
AST: 18 U/L (ref 15–41)
Albumin: 2.7 g/dL — ABNORMAL LOW (ref 3.5–5.0)
Alkaline Phosphatase: 45 U/L (ref 38–126)
Anion gap: 12 (ref 5–15)
BUN: 14 mg/dL (ref 8–23)
CO2: 24 mmol/L (ref 22–32)
Calcium: 6.5 mg/dL — ABNORMAL LOW (ref 8.9–10.3)
Chloride: 104 mmol/L (ref 98–111)
Creatinine, Ser: 1.13 mg/dL — ABNORMAL HIGH (ref 0.44–1.00)
GFR, Estimated: 49 mL/min — ABNORMAL LOW (ref >60)
Glucose, Bld: 170 mg/dL — ABNORMAL HIGH (ref 70–99)
Potassium: 3.5 mmol/L (ref 3.5–5.1)
Sodium: 140 mmol/L (ref 135–145)
Total Bilirubin: 0.3 mg/dL (ref 0.3–1.2)
Total Protein: 5.9 g/dL — ABNORMAL LOW (ref 6.5–8.1)

## 2022-12-05 LAB — ECHOCARDIOGRAM COMPLETE
Area-P 1/2: 3.99 cm2
Height: 63 in
S' Lateral: 2.1 cm
Weight: 2264.57 [oz_av]

## 2022-12-05 LAB — CBC
HCT: 22.7 % — ABNORMAL LOW (ref 36.0–46.0)
Hemoglobin: 7.6 g/dL — ABNORMAL LOW (ref 12.0–15.0)
MCH: 35 pg — ABNORMAL HIGH (ref 26.0–34.0)
MCHC: 33.5 g/dL (ref 30.0–36.0)
MCV: 104.6 fL — ABNORMAL HIGH (ref 80.0–100.0)
Platelets: 168 10*3/uL (ref 150–400)
RBC: 2.17 MIL/uL — ABNORMAL LOW (ref 3.87–5.11)
RDW: 15.6 % — ABNORMAL HIGH (ref 11.5–15.5)
WBC: 2.7 10*3/uL — ABNORMAL LOW (ref 4.0–10.5)
nRBC: 0 % (ref 0.0–0.2)

## 2022-12-05 LAB — C DIFFICILE QUICK SCREEN W PCR REFLEX
C Diff antigen: NEGATIVE
C Diff interpretation: NOT DETECTED
C Diff toxin: NEGATIVE

## 2022-12-05 LAB — MAGNESIUM
Magnesium: 0.9 mg/dL — CL (ref 1.7–2.4)
Magnesium: 1.9 mg/dL (ref 1.7–2.4)

## 2022-12-05 LAB — TSH: TSH: 0.784 u[IU]/mL (ref 0.350–4.500)

## 2022-12-05 LAB — CHLORIDE, URINE, RANDOM: Chloride Urine: 50 mmol/L

## 2022-12-05 LAB — APTT: aPTT: 35 s (ref 24–36)

## 2022-12-05 LAB — VITAMIN D 25 HYDROXY (VIT D DEFICIENCY, FRACTURES): Vit D, 25-Hydroxy: 41.74 ng/mL (ref 30–100)

## 2022-12-05 LAB — PROCALCITONIN: Procalcitonin: <0.1 ng/mL

## 2022-12-05 LAB — T4, FREE: Free T4: 1.33 ng/dL — ABNORMAL HIGH (ref 0.61–1.12)

## 2022-12-05 LAB — CORTISOL-AM, BLOOD: Cortisol - AM: 5 ug/dL — ABNORMAL LOW (ref 6.7–22.6)

## 2022-12-05 MED ORDER — POTASSIUM CHLORIDE CRYS ER 20 MEQ PO TBCR
40.0000 meq | EXTENDED_RELEASE_TABLET | Freq: Once | ORAL | Status: AC
  Administered 2022-12-05: 40 meq via ORAL
  Filled 2022-12-05: qty 2

## 2022-12-05 MED ORDER — POTASSIUM CHLORIDE 10 MEQ/100ML IV SOLN
10.0000 meq | INTRAVENOUS | Status: AC
  Administered 2022-12-05 (×4): 10 meq via INTRAVENOUS
  Filled 2022-12-05 (×4): qty 100

## 2022-12-05 MED ORDER — RIVAROXABAN 20 MG PO TABS: 20.0000 mg | ORAL_TABLET | Freq: Every day | ORAL | Status: DC

## 2022-12-05 MED ORDER — RIVAROXABAN 20 MG PO TABS
20.0000 mg | ORAL_TABLET | Freq: Every day | ORAL | Status: DC
  Administered 2022-12-05 – 2022-12-13 (×9): 20 mg via ORAL
  Filled 2022-12-05 (×9): qty 1

## 2022-12-05 MED ORDER — SODIUM CHLORIDE 0.9 % IV SOLN
3.0000 g | Freq: Four times a day (QID) | INTRAVENOUS | Status: DC
  Administered 2022-12-05 – 2022-12-06 (×5): 3 g via INTRAVENOUS
  Filled 2022-12-05 (×5): qty 8

## 2022-12-05 MED ORDER — ALPRAZOLAM 0.25 MG PO TABS
0.2500 mg | ORAL_TABLET | Freq: Three times a day (TID) | ORAL | Status: DC | PRN
  Administered 2022-12-05 – 2022-12-07 (×3): 0.25 mg via ORAL
  Filled 2022-12-05 (×3): qty 1

## 2022-12-05 MED ORDER — ZOLPIDEM TARTRATE 5 MG PO TABS
5.0000 mg | ORAL_TABLET | Freq: Every evening | ORAL | Status: DC | PRN
  Administered 2022-12-06 – 2022-12-15 (×10): 5 mg via ORAL
  Filled 2022-12-05 (×11): qty 1

## 2022-12-05 MED ORDER — INFLUENZA VAC A&B SURF ANT ADJ 0.5 ML IM SUSY
0.5000 mL | PREFILLED_SYRINGE | INTRAMUSCULAR | Status: DC
  Filled 2022-12-05: qty 0.5

## 2022-12-05 MED ORDER — MAGNESIUM SULFATE 2 GM/50ML IV SOLN
2.0000 g | INTRAVENOUS | Status: AC
  Administered 2022-12-05 (×2): 2 g via INTRAVENOUS
  Filled 2022-12-05 (×2): qty 50

## 2022-12-05 MED ORDER — DIAZEPAM 5 MG PO TABS
5.0000 mg | ORAL_TABLET | Freq: Once | ORAL | Status: AC
  Administered 2022-12-05: 5 mg via ORAL
  Filled 2022-12-05: qty 1

## 2022-12-05 MED ORDER — PALBOCICLIB 100 MG PO TABS: 100.0000 mg | ORAL_TABLET | ORAL | Status: DC

## 2022-12-05 NOTE — Progress Notes (Signed)
During admission screening questions patient advised she had not received her flu vaccine yet this year but that she normally does get the flu vaccine every October and that she would like the flu vaccine while at the hospital.    Order placed for flu vaccine, allergy warning advisory popped up, patient stated that she receives flu vaccine every year and has no history of allergic reaction to vaccine.

## 2022-12-05 NOTE — Evaluation (Signed)
Clinical/Bedside Swallow Evaluation Patient Details  Name: Stacey [name] MRN: [id number] Date of Birth: [birth year]  Today's Date: 12/05/2022 Time: SLP Start Time (ACUTE ONLY): 1320 SLP Stop Time (ACUTE ONLY): 1331 SLP Time Calculation (min) (ACUTE ONLY): 11 min  Past Medical History:  Past Medical History:  Diagnosis Date   ANEMIA-NOS    ANXIETY    Breast cancer (HCC) 1997 L, 2012 R   s/p chemo/xrt   COPD    resolved   DIVERTICULOSIS, COLON 2008   Dizziness    Family history of breast cancer    Family history of lung cancer    Family history of lymphoma    Family history of pancreatic cancer    Family history of uterine cancer    GERD    Hx of radiation therapy 10/19/11 -12/03/11   right breast   HYPERLIPIDEMIA    IRRITABLE BOWEL SYNDROME, HX OF    Left-sided carotid artery disease (HCC)    moderate left ICA stenosis   OSTEOARTHRITIS, HAND    PSVT (paroxysmal supraventricular tachycardia) (HCC)    symptomatic on event monitor   Past Surgical History:  Past Surgical History:  Procedure Laterality Date   ABDOMINAL HYSTERECTOMY     APPENDECTOMY     BREAST BIOPSY  11/14/10    r breast: inv, insitu mammary carcinoma w/calcif, er/pr +, her2 -   BREAST SURGERY     lumpectomy   CATARACT EXTRACTION     both eyes   ELECTROPHYSIOLOGIC STUDY N/A 05/10/2015   Procedure: SVT Ablation;  Surgeon: Will Jorja Loa, MD;  Location: [facility];  Service: Cardiovascular;  Laterality: N/A;   HERNIA REPAIR     inguinal herniorrhapy  1984   left   IR US GUIDE BX ASP/DRAIN  05/26/2019   rectal fissure repair     s/p benign breast biopsy  2003   right   s/p left foot surgury  2009   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 female who was discharged from the [facility] health system on October 18,  approximately 2 weeks ago after treatment for sepsis secondary to pneumonia that was present on admission affecting the right lower lobe.  She returns having had continued sensation of shortness of breath, and generalized weakness after finishing abx.  CXR 11/1 with no active disease.  Chest CT 11/1 with "Mild bilateral bronchial wall thickening, consistent with bronchitis." Per MD review of imaging "infectious eitiology of lung is felt to be rather low probability in this circumstance" and pneumonia may be resolving.  Pt with medical history significant of metastaic breast ca for which patien is on ibrance. However no iv chemotherapy.    Assessment / Plan / Recommendation  Clinical Impression  Pt presents with grossly normal swallow function as assessed clinically.  Pt tolerated all consistencies trialed without any clinical s/s of aspiration.  She exhibited good oral clearance of solids after slightly prolonged oral phase.  Pt with new upper denture and largely edentulous lower arch.  She reports she is having some difficulty chewing as she grows accostumed to her new plate.  Offered mechanical soft foods for ease of intake v choosing easier to eat foods from unrestricted diet.  Pt would like some time to test out eating.  Will continue current diet at present.  If pt is experiencing frustration/difficulty with PO intake, consider downgrade to mechanical soft diet per pt preference.  She appears safe for any consistency diet.    Recommend regular texture diet with thin liquids.  SLP Visit Diagnosis: Dysphagia, oral phase (R13.11)    Aspiration Risk  No limitations    Diet Recommendation Regular;Thin liquid    Liquid Administration via: Cup;Straw Medication Administration: Whole meds with liquid Compensations: Slow rate;Small sips/bites Postural Changes: Seated upright at 90 degrees    Other  Recommendations Oral Care Recommendations: Oral care BID    Recommendations for follow up therapy are  one component of a multi-disciplinary discharge planning process, led by the attending physician.  Recommendations may be updated based on patient status, additional functional criteria and insurance authorization.  Follow up Recommendations No SLP follow up      Assistance Recommended at Discharge  N/A  Functional Status Assessment Patient has not had a recent decline in their functional status  Frequency and Duration  (N/A)          Prognosis Prognosis for improved oropharyngeal function:  (N/A)      Swallow Study   General Date of Onset: 12/04/22 HPI: Stacey [name] is a 82 y.o. female who was discharged from the [facility] system on October 18, approximately 2 weeks ago after treatment for sepsis secondary to pneumonia that was present on admission affecting the right lower lobe.  She returns having had continued sensation of shortness of breath, and generalized weakness after finishing abx.  CXR 11/1 with no active disease.  Chest CT 11/1 with "Mild bilateral bronchial wall thickening, consistent with bronchitis." Per MD review of imaging "infectious eitiology of lung is felt to be rather low probability in this circumstance" and pneumonia may be resolving.  Pt with medical history significant of metastaic breast ca for which patien is on ibrance. However no iv chemotherapy. Type of Study: Bedside Swallow Evaluation Previous Swallow Assessment: None Diet Prior to this Study: Regular;Thin liquids (Level 0) Temperature Spikes Noted: No Respiratory Status: Nasal cannula History of Recent Intubation: No Behavior/Cognition: Alert;Cooperative;Pleasant mood Oral Cavity Assessment: Within Functional Limits Oral Care Completed by SLP: No Oral Cavity - Dentition: Missing dentition;Dentures, top Patient Positioning: Upright in bed Baseline Vocal Quality: Normal Volitional Cough: Strong Volitional Swallow: Able to elicit    Oral/Motor/Sensory Function Overall Oral Motor/Sensory Function:  Within functional limits Facial ROM: Within Functional Limits Facial Symmetry: Within Functional Limits Lingual ROM: Within Functional Limits Lingual Symmetry: Within Functional Limits Lingual Strength: Within Functional Limits Velum: Within Functional Limits Mandible: Within Functional Limits   Ice Chips     Thin Liquid Thin Liquid: Within functional limits Presentation: Straw    Nectar Thick Nectar Thick Liquid: Not tested   Honey Thick Honey Thick Liquid: Not tested   Puree Puree: Within functional limits Presentation: Spoon   Solid     Solid: Impaired Oral Phase Impairments: Impaired mastication Oral Phase Functional Implications: Prolonged oral transit      Kerrie Pleasure, MA, CCC-SLP Acute Rehabilitation Services Office: [phone] 12/05/2022,1:53 PM

## 2022-12-05 NOTE — ED Notes (Signed)
Meal tray to bedside.

## 2022-12-05 NOTE — ED Notes (Signed)
Nurse rounded on pt to manage IV infusions, upon assessment pt is very anxious, unable to sit still, pt complaining that she cannot breathe well. Pt O2 sats 98% on 2L [place]. MD made aware. See new orders

## 2022-12-05 NOTE — Progress Notes (Signed)
PROGRESS NOTE    Stacey [name]  VHQ:[id number] DOB: [birth year] DOA: 12/04/2022 PCP: [name], MD   Brief Narrative: 82 y.o. female with medical history significant of metastaic breast ca for which patien is on  ibrance. However no iv chemotherapy.  Patient was discharged from the [facility] health system on October 18, approximately 2 weeks ago after treatment for sepsis secondary to pneumonia that was present on admission affecting the right lower lobe.   However patient states that when she was discharged she finished her antibiotics.  She has continued to have some sensation of shortness of breath, and generalized weakness since then.  This is present when she attempts to exert herself such as walk.  Which has become increasingly difficult.  Patient does not distinguish between sensation of fatigue or generalized weakness versus shortness of breath.  Patient certainly does not have any chest pain or vomiting or fever or palpitation or syncope or fall.  Patient does have a dry cough that is rather minimal per patient.  Patient apparently was so weak this morning that she could not get out of bed prompting her stepdaughter to seek EMS help to bring the patient to the hospital.   Patient does have chronic kidney disease and also has chronic diarrhea that has been attributed to [place] use.  Patient takes Imodium occasionally for that.  Further when EMS got there, patient was put on 2 L/min of supplementary oxygen, patient may have been hypoxic, although this has not been clearly documented.  During this encounter I took the patient off of supplementary oxygen and patient has maintained sats over 90%.  As of my encounter with the patient patient pending zosyn.  Cultures have been drawn. Medical eval is sought.  Assessment & Plan:   Principal Problem:   Pneumonia Active Problems:   HTN (hypertension)   Recurrent cancer of left breast (HCC)   Metabolic acidosis   Hypocalcemia   Fatigue    Immunosuppression due to chronic steroid use (HCC)   DVT (deep venous thrombosis) (HCC)   Diarrhea  #1 acute hypoxic respiratory failure secondary to aspiration pneumonia patient admitted with shortness of breath and cough.  CT chest no pulmonary embolism.  Mild bilateral bronchial wall thickening consistent with bronchitis.  New clustered solid nodule seen in the lingula and unchanged clustered nodules and mucous plugging of the right lower lobe likely due to infection or aspiration.  Patient on 2 L of oxygen to maintain sats above 92%. Continue Zosyn.  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 Difficile  #4 CKD with metabolic acidosis patient received bicarb Will follow-up in a.m.  #5 stage IV recurrent left breast cancer continue Arimidex and Ibrance  #6 history of hypertension blood pressure running soft will monitor continue IV fluids  #7 severe anxiety does not take anything at home we will start her on low-dose Xanax.    #8 multiple electrolyte abnormalities hypokalemia/hypomagnesemia/hypocalcemia will replete or this is likely from ongoing chronic diarrhea.   Estimated body mass index is 25.07 kg/m as calculated from the following:   Height as of this encounter: 5\' 3"  (1.6 m).   Weight as of this encounter: 64.2 kg.  DVT prophylaxis: Xarelto code Status: Full code Family Communication: None at bedside Disposition Plan:  Status is: Inpatient Remains inpatient appropriate because: Acute hypoxia   Consultants:  None  Procedures: None  Antimicrobials Unasyn  Subjective: Very very anxious saturation improved on 2 L of oxygen complains of being short of breath  Objective: Vitals:   12/05/22 1101 12/05/22 1130 12/05/22 1245 12/05/22 1305  BP:  (!) 114/54  (!) 108/56  Pulse:  96  87  Resp:  16  18  Temp:    98.1 F (36.7 C)  TempSrc:    Oral  SpO2:  100%   100%  Weight: 61.2 kg  64.2 kg   Height: 5\' 3"  (1.6 m)  5\' 3"  (1.6 m)     Intake/Output Summary (Last 24 hours) at 12/05/2022 1428 Last data filed at 12/05/2022 1100 Gross per 24 hour  Intake 50 ml  Output --  Net 50 ml   Filed Weights   12/05/22 1101 12/05/22 1245  Weight: 61.2 kg 64.2 kg    Examination:  General exam: Appears very restless and anxious  respiratory system: Few rhonchi to auscultation. Respiratory effort normal. Cardiovascular system: S1 & S2 heard, RRR. No JVD, murmurs, rubs, gallops or clicks. No pedal edema. Gastrointestinal system: Abdomen is nondistended, soft and nontender. No organomegaly or masses felt. Normal bowel sounds heard. Central nervous system: Alert and oriented.  Extremities: Right lower extremity edema more than left chronic   Data Reviewed: I have personally reviewed following labs and imaging studies  CBC: Recent Labs  Lab 12/04/22 1553 12/05/22 0500  WBC 3.4* 2.7*  NEUTROABS 2.5  --   HGB 8.3* 7.6*  HCT 25.2* 22.7*  MCV 107.2* 104.6*  PLT 185 168   Basic Metabolic Panel: Recent Labs  Lab 12/04/22 1553 12/04/22 2050 12/05/22 0500  NA 142 143 138  K 3.4* 2.9* 3.0*  CL 112* 114* 106  CO2 17* 16* 19*  GLUCOSE 110* 112* 184*  BUN 20 19 15   CREATININE 1.46* 1.22* 1.23*  CALCIUM 6.3* 6.2* 6.7*  MG  --  <0.5* 0.9*  PHOS  --  3.9  --    GFR: Estimated Creatinine Clearance: 31.8 mL/min (A) (by C-G formula based on SCr of 1.23 mg/dL (H)). Liver Function Tests: Recent Labs  Lab 12/04/22 1553 12/04/22 2050  AST 22  --   ALT 14  --   ALKPHOS 46  --   BILITOT 0.8  --   PROT 6.7  --   ALBUMIN 3.0* 2.9*   No results for input(s): "LIPASE", "AMYLASE" in the last 168 hours. No results for input(s): "AMMONIA" in the last 168 hours. Coagulation Profile: Recent Labs  Lab 12/05/22 0500  INR 3.2*   Cardiac Enzymes: No results for input(s): "CKTOTAL", "CKMB", "CKMBINDEX", "TROPONINI" in the last 168 hours. BNP (last 3  results) No results for input(s): "PROBNP" in the last 8760 hours. HbA1C: No results for input(s): "HGBA1C" in the last 72 hours. CBG: No results for input(s): "GLUCAP" in the last 168 hours. Lipid Profile: No results for input(s): "CHOL", "HDL", "LDLCALC", "TRIG", "CHOLHDL", "LDLDIRECT" in the last 72 hours. Thyroid Function Tests: Recent Labs    12/05/22 0500  TSH 0.784  FREET4 1.33*   Anemia Panel: No results for input(s): "VITAMINB12", "FOLATE", "FERRITIN", "TIBC", "IRON", "RETICCTPCT" in the last 72 hours. Sepsis Labs: Recent Labs  Lab 12/04/22 1609 12/04/22 1745 12/04/22 2050  PROCALCITON  --   --  <0.10  LATICACIDVEN 1.1 0.6  --     Recent Results (from the past 240 hour(s))  Culture, blood (routine x 2)     Status: None (Preliminary result)   Collection Time: 12/04/22  3:42 PM   Specimen: BLOOD  Result Value Ref Range Status   Specimen Description   Final    BLOOD RIGHT ANTECUBITAL Performed at [facility], [address] [place], [place] [address]    Special Requests   Final    BOTTLES DRAWN AEROBIC AND ANAEROBIC Blood Culture adequate volume Performed at [facility], [address], [place], [place] [address]    Culture   Final    NO GROWTH < 12 HOURS Performed at [facility], [address] [place], [place] [address]    Report Status PENDING  Incomplete  Culture, blood (routine x 2)     Status: None (Preliminary result)   Collection Time: 12/04/22  4:39 PM   Specimen: BLOOD RIGHT HAND  Result Value Ref Range Status   Specimen Description   Final    BLOOD RIGHT HAND Performed at [facility], [address] [place], [place] [address]    Special Requests   Final    BOTTLES DRAWN AEROBIC ONLY Blood Culture results may not be optimal due to an inadequate volume of blood received in culture bottles Performed at [facility], [address] [place], [place] [address]    Culture    Final    NO GROWTH < 12 HOURS Performed at [facility], [address] [place], [place] [address]    Report Status PENDING  Incomplete  SARS Coronavirus 2 by RT PCR (hospital order, performed in [facility] hospital lab) *cepheid single result test* Anterior Nasal Swab     Status: None   Collection Time: 12/04/22  9:37 PM   Specimen: Anterior Nasal Swab  Result Value Ref Range Status   SARS Coronavirus 2 by RT PCR NEGATIVE NEGATIVE Final    Comment: (NOTE) SARS-CoV-2 target nucleic acids are NOT DETECTED.  The SARS-CoV-2 RNA is generally detectable in upper and lower respiratory specimens during the acute phase of infection. The lowest concentration of SARS-CoV-2 viral copies this assay can detect is 250 copies / mL. A negative result does not preclude SARS-CoV-2 infection and should not be used as the sole basis for treatment or other patient management decisions.  A negative result may occur with improper specimen collection / handling, submission of specimen other than nasopharyngeal swab, presence of viral mutation(s) within the areas targeted by this assay, and inadequate number of viral copies (<250 copies / mL). A negative result must be combined with clinical observations, patient history, and epidemiological information.  Fact Sheet for Patients:   RoadLapTop.co.za  Fact Sheet for Healthcare Providers: http://kim-miller.com/  This test is not yet approved or  cleared by the [place] FDA and has been authorized for detection and/or diagnosis of SARS-CoV-2 by FDA under an Emergency Use Authorization (EUA).  This EUA will remain in effect (meaning this test can be used) for the duration of the COVID-19 declaration under Section 564(b)(1) of the Act, 21 U.S.C. section 360bbb-3(b)(1), unless the authorization is terminated or revoked sooner.  Performed at [facility], [address], [place], [place] [address]   Respiratory (~20 pathogens) panel by PCR     Status: None   Collection Time: 12/04/22  9:37 PM   Specimen: Anterior Nasal Swab; Respiratory  Result Value Ref Range Status   Adenovirus NOT DETECTED NOT DETECTED Final   Coronavirus 229E NOT DETECTED NOT DETECTED Final    Comment: (NOTE) The Coronavirus on the Respiratory Panel, DOES NOT test for the novel  Coronavirus (2019  nCoV)    Coronavirus HKU1 NOT DETECTED NOT DETECTED Final   Coronavirus NL63 NOT DETECTED NOT DETECTED Final   Coronavirus OC43 NOT DETECTED NOT DETECTED Final   Metapneumovirus NOT DETECTED NOT DETECTED Final   Rhinovirus / Enterovirus NOT DETECTED NOT DETECTED Final   Influenza A NOT DETECTED NOT DETECTED Final   Influenza B NOT DETECTED NOT DETECTED Final   Parainfluenza Virus 1 NOT DETECTED NOT DETECTED Final   Parainfluenza Virus 2 NOT DETECTED NOT DETECTED Final   Parainfluenza Virus 3 NOT DETECTED NOT DETECTED Final   Parainfluenza Virus 4 NOT DETECTED NOT DETECTED Final   Respiratory Syncytial Virus NOT DETECTED NOT DETECTED Final   Bordetella pertussis NOT DETECTED NOT DETECTED Final   Bordetella Parapertussis NOT DETECTED NOT DETECTED Final   Chlamydophila pneumoniae NOT DETECTED NOT DETECTED Final   Mycoplasma pneumoniae NOT DETECTED NOT DETECTED Final    Comment: Performed at [facility], [address], [place], [place] [address]         Radiology Studies: ECHOCARDIOGRAM COMPLETE  Result Date: 12/05/2022    ECHOCARDIOGRAM REPORT   Patient Name:   ANZAL BARTNICK Date of Exam: 12/05/2022 Medical Rec #:  [id number]    Height:       63.0 in Accession #:    [id number]   Weight:       138.0 lb Date of Birth:  May 09, 1940    BSA:          1.652 m Patient Age:    82 years     BP:           108/56 mmHg Patient Gender: F            HR:           90 bpm. Exam Location:  Inpatient Procedure: 2D Echo, Cardiac Doppler and Color Doppler Indications:    CHF Acute Systolic I50.21   History:        Patient has prior history of Echocardiogram examinations, most                 recent 05/25/2017. COPD; Risk Factors:Dyslipidemia.  Sonographer:    Harriette Bouillon RDCS Referring Phys: [id number] [facility] GOEL IMPRESSIONS  1. Left ventricular ejection fraction, by estimation, is 60 to 65%. The left ventricle has normal function. The left ventricle has no regional wall motion abnormalities. There is mild concentric left ventricular hypertrophy. Left ventricular diastolic parameters are consistent with Grade I diastolic dysfunction (impaired relaxation). Elevated left atrial pressure.  2. Right ventricular systolic function is normal. The right ventricular size is normal.  3. The mitral valve is normal in structure. No evidence of mitral valve regurgitation. No evidence of mitral stenosis.  4. The aortic valve is tricuspid. Aortic valve regurgitation is not visualized. No aortic stenosis is present. FINDINGS  Left Ventricle: Left ventricular ejection fraction, by estimation, is 60 to 65%. The left ventricle has normal function. The left ventricle has no regional wall motion abnormalities. The left ventricular internal cavity size was normal in size. There is  mild concentric left ventricular hypertrophy. Left ventricular diastolic parameters are consistent with Grade I diastolic dysfunction (impaired relaxation). Elevated left atrial pressure. Right Ventricle: The right ventricular size is normal. Right ventricular systolic function is normal. Left Atrium: Left atrial size was normal in size. Right Atrium: Right atrial size was normal in size. Pericardium: There is no evidence of pericardial effusion. Mitral Valve: The mitral valve is normal in structure. Mild mitral annular calcification.  No evidence of mitral valve regurgitation. No evidence of mitral valve stenosis. Tricuspid Valve: The tricuspid valve is normal in structure. Tricuspid valve regurgitation is mild . No evidence of tricuspid stenosis. Aortic  Valve: The aortic valve is tricuspid. Aortic valve regurgitation is not visualized. No aortic stenosis is present. Pulmonic Valve: The pulmonic valve was normal in structure. Pulmonic valve regurgitation is trivial. No evidence of pulmonic stenosis. Aorta: The aortic root is normal in size and structure. Venous: The inferior vena cava was not well visualized. IAS/Shunts: The interatrial septum was not well visualized.  LEFT VENTRICLE PLAX 2D LVIDd:         3.30 cm   Diastology LVIDs:         2.10 cm   LV e' medial:    5.44 cm/s LV PW:         1.20 cm   LV E/e' medial:  17.5 LV IVS:        1.20 cm   LV e' lateral:   7.18 cm/s LVOT diam:     2.00 cm   LV E/e' lateral: 13.3 LV SV:         63 LV SV Index:   38 LVOT Area:     3.14 cm  RIGHT VENTRICLE TAPSE (M-mode): 1.5 cm LEFT ATRIUM             Index LA diam:        2.10 cm 1.27 cm/m LA Vol (A2C):   39.2 ml 23.74 ml/m LA Vol (A4C):   49.1 ml 29.73 ml/m LA Biplane Vol: 46.1 ml 27.91 ml/m  AORTIC VALVE LVOT Vmax:   99.20 cm/s LVOT Vmean:  73.400 cm/s LVOT VTI:    0.202 m  AORTA Ao Root diam: 3.00 cm Ao Asc diam:  3.30 cm MITRAL VALVE               TRICUSPID VALVE MV Area (PHT): 3.99 cm    TR Peak grad:   37.5 mmHg MV Decel Time: 190 msec    TR Vmax:        306.00 cm/s MV E velocity: 95.20 cm/s MV A velocity: 99.50 cm/s  SHUNTS MV E/A ratio:  0.96        Systemic VTI:  0.20 m                            Systemic Diam: 2.00 cm Olga Millers MD Electronically signed by Olga Millers MD Signature Date/Time: 12/05/2022/2:27:54 PM    Final    CT Angio Chest PE W and/or Wo Contrast  Result Date: 12/04/2022 CLINICAL DATA:  Shortness of breath EXAM: CT ANGIOGRAPHY CHEST WITH CONTRAST TECHNIQUE: Multidetector CT imaging of the chest was performed using the standard protocol during bolus administration of intravenous contrast. Multiplanar CT image reconstructions and MIPs were obtained to evaluate the vascular anatomy. RADIATION DOSE REDUCTION: This exam was performed  according to the departmental dose-optimization program which includes automated exposure control, adjustment of the mA and/or kV according to patient size and/or use of iterative reconstruction technique. CONTRAST:  75mL OMNIPAQUE IOHEXOL 350 MG/ML SOLN COMPARISON:  CTA chest dated November 17, 2022 FINDINGS: Cardiovascular: No evidence of pulmonary embolus. Normal heart size. No pericardial effusion. Normal caliber thoracic aorta with severe atherosclerotic disease. Moderate coronary artery calcifications. Mediastinum/Nodes: Moderate hiatal hernia. Thyroid is unremarkable. No enlarged lymph nodes seen in the chest. Lungs/Pleura: Central airways are patent. Moderate emphysema. Subpleural reticulations of the anterior  right lung, consistent with postradiation change. Mild bilateral bronchial wall thickening. Unchanged clustered nodules and mucous plugging of the right lower lobe. New clustered solid nodule seen in the lingula. Reference new nodule measuring 4 mm on series 12, image 64. No pleural effusion or pneumothorax. Upper Abdomen: No acute abnormality. Musculoskeletal: Postsurgical changes of the right breast. No aggressive appearing osseous lesions. Review of the MIP images confirms the above findings. IMPRESSION: 1. No evidence of pulmonary embolus. 2. Mild bilateral bronchial wall thickening, consistent with bronchitis. 3. New clustered solid nodules seen in the lingula and unchanged clustered nodules and mucous plugging of the right lower lobe, likely due to infection or aspiration. Recommend follow-up in 3 months to ensure resolution. 4. Moderate hiatal hernia. 5. Aortic Atherosclerosis (ICD10-I70.0) and Emphysema (ICD10-J43.9). Electronically Signed   By: Allegra Lai M.D.   On: 12/04/2022 19:40   DG Chest Port 1 View  Result Date: 12/04/2022 CLINICAL DATA:  Shortness of breath EXAM: PORTABLE CHEST 1 VIEW COMPARISON:  11/17/2022 FINDINGS: Heart and mediastinal contours within normal limits. No  confluent airspace opacities or effusions. No acute bony abnormality. IMPRESSION: No active disease. Electronically Signed   By: Charlett Nose M.D.   On: 12/04/2022 18:26        Scheduled Meds:  anastrozole  1 mg Oral Daily   aspirin  81 mg Oral Daily   dexamethasone  2 mg Oral Daily   [START ON 12/06/2022] influenza vaccine adjuvanted  0.5 mL Intramuscular Tomorrow-1000   rivaroxaban  20 mg Oral Q supper   sodium bicarbonate  650 mg Oral TID   sodium chloride flush  3 mL Intravenous Q12H   Continuous Infusions:  sodium bicarbonate 150 mEq in dextrose 5 % 1,150 mL infusion 125 mL/hr at 12/05/22 0821     LOS: 1 day    Time spent: 39 min  Alwyn Ren, MD  12/05/2022, 2:28 PM

## 2022-12-05 NOTE — ED Notes (Signed)
ED TO INPATIENT HANDOFF REPORT  Name/Age/Gender Stacey [name] 82 y.o. female  Code Status    Code Status Orders  (From admission, onward)           Start     Ordered   12/04/22 2112  Full code  Continuous       Question:  By:  Answer:  Consent: discussion documented in EHR   12/04/22 2113           Code Status History     Date Active Date Inactive Code Status Order ID Comments User Context   11/17/2022 2213 11/20/2022 2342 Full Code [id number]  Hillary Bow, DO ED      Advance Directive Documentation    Flowsheet Row Most Recent Value  Type of Advance Directive Healthcare Power of Attorney, Living will  Pre-existing out of facility DNR order (yellow form or pink MOST form) --  "MOST" Form in Place? --       Home/SNF/Other Home  Chief Complaint Pneumonia [J18.9]  Level of Care/Admitting Diagnosis ED Disposition     ED Disposition  Admit   Condition  --   Comment  Hospital Area: [facility] [[id number]]  Level of Care: Progressive [102]  Admit to Progressive based on following criteria: GI, ENDOCRINE disease patients with GI bleeding, acute liver failure or pancreatitis, stable with diabetic ketoacidosis or thyrotoxicosis (hypothyroid) state.  Admit to Progressive based on following criteria: NEPHROLOGY stable condition requiring close monitoring for AKI, requiring Hemodialysis or Peritoneal Dialysis either from expected electrolyte imbalance, acidosis, or fluid overload that can be managed by NIPPV or high flow oxygen.  May admit patient to [facility] or [facility] if equivalent level of care is available:: No  Covid Evaluation: Symptomatic Person Under Investigation (PUI) or recent exposure (last 10 days) *Testing Required*  Diagnosis: Pneumonia [227785]  Admitting Physician: Nolberto Hanlon [[id number]]  Attending Physician: Nolberto Hanlon [[id number]]  Certification:: I certify this patient will need inpatient services for at least 2  midnights          Medical History Past Medical History:  Diagnosis Date   ANEMIA-NOS    ANXIETY    Breast cancer (HCC) 1997 L, 2012 R   s/p chemo/xrt   COPD    resolved   DIVERTICULOSIS, COLON 2008   Dizziness    Family history of breast cancer    Family history of lung cancer    Family history of lymphoma    Family history of pancreatic cancer    Family history of uterine cancer    GERD    Hx of radiation therapy 10/19/11 -12/03/11   right breast   HYPERLIPIDEMIA    IRRITABLE BOWEL SYNDROME, HX OF    Left-sided carotid artery disease (HCC)    moderate left ICA stenosis   OSTEOARTHRITIS, HAND    PSVT (paroxysmal supraventricular tachycardia) (HCC)    symptomatic on event monitor    Allergies Allergies  Allergen Reactions   Other Nausea Only and Other (See Comments)    A lot of antibiotics cause extreme nausea   Tape Other (See Comments)    PAPER TAPE IS MUCH PREFERRED   Aminoglycosides Other (See Comments)    Unknown reaction - pt is not sure where this entry came from   Bacitracin Other (See Comments)    Reaction not recalled   Bee Venom Swelling and Other (See Comments)    Severe body swelling   Cephalexin Nausea Only   Clindamycin/Lincomycin Diarrhea and Nausea  And Vomiting   Codeine Nausea And Vomiting and Other (See Comments)    Pt can take codeine cough syrup    IV Location/Drains/Wounds Patient Lines/Drains/Airways Status     Active Line/Drains/Airways     Name Placement date Placement time Site Days   Peripheral IV 12/04/22 20 G Right Antecubital 12/04/22  1557  Antecubital  1   Peripheral IV 12/04/22 22 G 1" Posterior;Right Wrist 12/04/22  2151  Wrist  1            Labs/Imaging Results for orders placed or performed during the hospital encounter of 12/04/22 (from the past 48 hour(s))  Culture, blood (routine x 2)     Status: None (Preliminary result)   Collection Time: 12/04/22  3:42 PM   Specimen: BLOOD  Result Value Ref Range    Specimen Description      BLOOD RIGHT ANTECUBITAL Performed at [facility], [address] [place], [place] [address]    Special Requests      BOTTLES DRAWN AEROBIC AND ANAEROBIC Blood Culture adequate volume Performed at [facility], [address], [place], [place] [address]    Culture      NO GROWTH < 12 HOURS Performed at [facility], [address], [place], [place] [address]    Report Status PENDING   Basic metabolic panel     Status: Abnormal   Collection Time: 12/04/22  3:53 PM  Result Value Ref Range   Sodium 142 135 - 145 mmol/L   Potassium 3.4 (L) 3.5 - 5.1 mmol/L   Chloride 112 (H) 98 - 111 mmol/L   CO2 17 (L) 22 - 32 mmol/L   Glucose, Bld 110 (H) 70 - 99 mg/dL    Comment: Glucose reference range applies only to samples taken after fasting for at least 8 hours.   BUN 20 8 - 23 mg/dL   Creatinine, Ser 6.57 (H) 0.44 - 1.00 mg/dL   Calcium 6.3 (LL) 8.9 - 10.3 mg/dL    Comment: CRITICAL RESULT CALLED TO, READ BACK BY AND VERIFIED WITH Dale Thornburg, RN 12/04/22 1742 J. COLE   GFR, Estimated 36 (L) >60 mL/min    Comment: (NOTE) Calculated using the CKD-EPI Creatinine Equation (2021)    Anion gap 13 5 - 15    Comment: Performed at [facility], [address], [place], [place] [address]  Brain natriuretic peptide     Status: Abnormal   Collection Time: 12/04/22  3:53 PM  Result Value Ref Range   B Natriuretic Peptide 164.7 (H) 0.0 - 100.0 pg/mL    Comment: Performed at [facility], [address], [place], [place] [address]  CBC with Differential     Status: Abnormal   Collection Time: 12/04/22  3:53 PM  Result Value Ref Range   WBC 3.4 (L) 4.0 - 10.5 K/uL   RBC 2.35 (L) 3.87 - 5.11 MIL/uL   Hemoglobin 8.3 (L) 12.0 - 15.0 g/dL   HCT 41.3 (L) 24.4 - 01.0 %   MCV 107.2 (H) 80.0 - 100.0 fL   MCH 35.3 (H) 26.0 - 34.0 pg   MCHC 32.9 30.0 - 36.0 g/dL   RDW 27.2 (H) 53.6 - 64.4 %    Platelets 185 150 - 400 K/uL   nRBC 0.0 0.0 - 0.2 %   Neutrophils Relative % 73 %   Neutro Abs 2.5 1.7 - 7.7 K/uL   Lymphocytes Relative 17 %   Lymphs Abs  0.6 (L) 0.7 - 4.0 K/uL   Monocytes Relative 7 %   Monocytes Absolute 0.3 0.1 - 1.0 K/uL   Eosinophils Relative 1 %   Eosinophils Absolute 0.0 0.0 - 0.5 K/uL   Basophils Relative 1 %   Basophils Absolute 0.0 0.0 - 0.1 K/uL   Immature Granulocytes 1 %   Abs Immature Granulocytes 0.04 0.00 - 0.07 K/uL    Comment: Performed at [facility], [address], [place], [place] [address]  Hepatic function panel     Status: Abnormal   Collection Time: 12/04/22  3:53 PM  Result Value Ref Range   Total Protein 6.7 6.5 - 8.1 g/dL   Albumin 3.0 (L) 3.5 - 5.0 g/dL   AST 22 15 - 41 U/L   ALT 14 0 - 44 U/L   Alkaline Phosphatase 46 38 - 126 U/L   Total Bilirubin 0.8 0.3 - 1.2 mg/dL   Bilirubin, Direct <1.9 0.0 - 0.2 mg/dL   Indirect Bilirubin NOT CALCULATED 0.3 - 0.9 mg/dL    Comment: Performed at [facility], [address] [place], [place] [address]  Troponin I (High Sensitivity)     Status: Abnormal   Collection Time: 12/04/22  3:53 PM  Result Value Ref Range   Troponin I (High Sensitivity) 29 (H) <18 ng/L    Comment: (NOTE) Elevated high sensitivity troponin I (hsTnI) values and significant  changes across serial measurements may suggest ACS but many other  chronic and acute conditions are known to elevate hsTnI results.  Refer to the "Links" section for chest pain algorithms and additional  guidance. Performed at [facility], [address], [place], [place] [address]   I-Stat CG4 Lactic Acid     Status: None   Collection Time: 12/04/22  4:09 PM  Result Value Ref Range   Lactic Acid, Venous 1.1 0.5 - 1.9 mmol/L  Culture, blood (routine x 2)     Status: None (Preliminary result)   Collection Time: 12/04/22  4:39 PM   Specimen: BLOOD RIGHT HAND  Result Value Ref Range    Specimen Description      BLOOD RIGHT HAND Performed at [facility], [address] [place], [place] [address]    Special Requests      BOTTLES DRAWN AEROBIC ONLY Blood Culture results may not be optimal due to an inadequate volume of blood received in culture bottles Performed at [facility], [address], [place], [place] [address]    Culture      NO GROWTH < 12 HOURS Performed at [facility], [address] [place] [address] [place], [place] [address]    Report Status PENDING   I-Stat CG4 Lactic Acid     Status: None   Collection Time: 12/04/22  5:45 PM  Result Value Ref Range   Lactic Acid, Venous 0.6 0.5 - 1.9 mmol/L  Troponin I (High Sensitivity)     Status: Abnormal   Collection Time: 12/04/22  6:12 PM  Result Value Ref Range   Troponin I (High Sensitivity) 30 (H) <18 ng/L    Comment: (NOTE) Elevated high sensitivity troponin I (hsTnI) values and significant  changes across serial measurements may suggest ACS but many other  chronic and acute conditions are known to elevate hsTnI results.  Refer to the "Links" section for chest pain algorithms and additional  guidance. Performed at [facility], [address], [place], [place] [address]   Magnesium  Status: Abnormal   Collection Time: 12/04/22  8:50 PM  Result Value Ref Range   Magnesium <0.5 (LL) 1.7 - 2.4 mg/dL    Comment: CRITICAL RESULT CALLED TO, READ BACK BY AND VERIFIED WITH Valaria Good RN @ 2210 12/04/22. GILBERTL Performed at [facility], [address], [place], [place] [address]   Phosphorus     Status: None   Collection Time: 12/04/22  8:50 PM  Result Value Ref Range   Phosphorus 3.9 2.5 - 4.6 mg/dL    Comment: Performed at [facility], [address], [place], [place] [address]  Albumin     Status: Abnormal   Collection Time: 12/04/22  8:50 PM  Result Value Ref Range   Albumin 2.9 (L) 3.5 - 5.0 g/dL    Comment: Performed  at [facility], [address] [place], [place] [address]  Basic metabolic panel     Status: Abnormal   Collection Time: 12/04/22  8:50 PM  Result Value Ref Range   Sodium 143 135 - 145 mmol/L   Potassium 2.9 (L) 3.5 - 5.1 mmol/L   Chloride 114 (H) 98 - 111 mmol/L   CO2 16 (L) 22 - 32 mmol/L   Glucose, Bld 112 (H) 70 - 99 mg/dL    Comment: Glucose reference range applies only to samples taken after fasting for at least 8 hours.   BUN 19 8 - 23 mg/dL   Creatinine, Ser 6.29 (H) 0.44 - 1.00 mg/dL   Calcium 6.2 (LL) 8.9 - 10.3 mg/dL    Comment: CRITICAL RESULT CALLED TO, READ BACK BY AND VERIFIED WITH Valaria Good RN @ 2210 12/04/22. GILBERTL    GFR, Estimated 44 (L) >60 mL/min    Comment: (NOTE) Calculated using the CKD-EPI Creatinine Equation (2021)    Anion gap 13 5 - 15    Comment: Performed at [facility], [address] [place], [place] [address]  VITAMIN D 25 Hydroxy (Vit-D Deficiency, Fractures)     Status: None   Collection Time: 12/04/22  8:50 PM  Result Value Ref Range   Vit D, 25-Hydroxy 41.74 30 - 100 ng/mL    Comment: (NOTE) Vitamin D deficiency has been defined by the Institute of Medicine  and an Endocrine Society practice guideline as a level of serum 25-OH  vitamin D less than 20 ng/mL (1,2). The Endocrine Society went on to  further define vitamin D insufficiency as a level between 21 and 29  ng/mL (2).  1. IOM (Institute of Medicine). 2010. Dietary reference intakes for  calcium and D. [place]: [employer]. 2. Holick MF, Binkley Alder, Bischoff-Ferrari HA, et al. Evaluation,  treatment, and prevention of vitamin D deficiency: an Endocrine  Society clinical practice guideline, JCEM. 2011 Jul; 96(7): 1911-30.  Performed at [facility], [address] [place], [place] [address]   Procalcitonin     Status: None   Collection Time: 12/04/22  8:50 PM  Result Value Ref Range   Procalcitonin <0.10  ng/mL    Comment:        Interpretation: PCT (Procalcitonin) <= 0.5 ng/mL: Systemic infection (sepsis) is not likely. Local bacterial infection is possible. (NOTE)       Sepsis PCT Algorithm           Lower Respiratory Tract  Infection PCT Algorithm    ----------------------------     ----------------------------         PCT < 0.25 ng/mL                PCT < 0.10 ng/mL          Strongly encourage             Strongly discourage   discontinuation of antibiotics    initiation of antibiotics    ----------------------------     -----------------------------       PCT 0.25 - 0.50 ng/mL            PCT 0.10 - 0.25 ng/mL               OR       >80% decrease in PCT            Discourage initiation of                                            antibiotics      Encourage discontinuation           of antibiotics    ----------------------------     -----------------------------         PCT >= 0.50 ng/mL              PCT 0.26 - 0.50 ng/mL               AND        <80% decrease in PCT             Encourage initiation of                                             antibiotics       Encourage continuation           of antibiotics    ----------------------------     -----------------------------        PCT >= 0.50 ng/mL                  PCT > 0.50 ng/mL               AND         increase in PCT                  Strongly encourage                                      initiation of antibiotics    Strongly encourage escalation           of antibiotics                                     -----------------------------                                           PCT <= 0.25 ng/mL  OR                                        > 80% decrease in PCT                                      Discontinue / Do not initiate                                             antibiotics  Performed at [facility], [address], [place], [place] [address]   Blood gas, arterial     Status: Abnormal   Collection Time: 12/04/22  8:56 PM  Result Value Ref Range   O2 Content 21.0 L/min   pH, Arterial 7.45 7.35 - 7.45   pCO2 arterial 19 (LL) 32 - 48 mmHg    Comment: CRITICAL RESULT CALLED TO, READ BACK BY AND VERIFIED WITH: Fanny Skates RN @ 2116 12/04/22. GILBERTL    pO2, Arterial 91 83 - 108 mmHg   Bicarbonate 12.9 (L) 20.0 - 28.0 mmol/L   Acid-base deficit 8.8 (H) 0.0 - 2.0 mmol/L   O2 Saturation 100 %   Patient temperature 36.6    Collection site RIGHT RADIAL    Drawn by [id number]    Allens test (pass/fail) PASS PASS    Comment: Performed at [facility], [address], [place], [place] [address]  SARS Coronavirus 2 by RT PCR (hospital order, performed in [facility] hospital lab) *cepheid single result test* Anterior Nasal Swab     Status: None   Collection Time: 12/04/22  9:37 PM   Specimen: Anterior Nasal Swab  Result Value Ref Range   SARS Coronavirus 2 by RT PCR NEGATIVE NEGATIVE    Comment: (NOTE) SARS-CoV-2 target nucleic acids are NOT DETECTED.  The SARS-CoV-2 RNA is generally detectable in upper and lower respiratory specimens during the acute phase of infection. The lowest concentration of SARS-CoV-2 viral copies this assay can detect is 250 copies / mL. A negative result does not preclude SARS-CoV-2 infection and should not be used as the sole basis for treatment or other patient management decisions.  A negative result may occur with improper specimen collection / handling, submission of specimen other than nasopharyngeal swab, presence of viral mutation(s) within the areas targeted by this assay, and inadequate number of viral copies (<250 copies / mL). A negative result must be combined with clinical observations, patient history, and epidemiological information.  Fact Sheet for Patients:   RoadLapTop.co.za  Fact Sheet for Healthcare  Providers: http://kim-miller.com/  This test is not yet approved or  cleared by the [place] FDA and has been authorized for detection and/or diagnosis of SARS-CoV-2 by FDA under an Emergency Use Authorization (EUA).  This EUA will remain in effect (meaning this test can be used) for the duration of the COVID-19 declaration under Section 564(b)(1) of the Act, 21 U.S.C. section 360bbb-3(b)(1), unless the authorization is terminated or revoked sooner.  Performed at [facility], [address], [place], [place] [address]   Respiratory (~20 pathogens) panel by PCR     Status: None   Collection Time: 12/04/22  9:37 PM  Specimen: Anterior Nasal Swab; Respiratory  Result Value Ref Range   Adenovirus NOT DETECTED NOT DETECTED   Coronavirus 229E NOT DETECTED NOT DETECTED    Comment: (NOTE) The Coronavirus on the Respiratory Panel, DOES NOT test for the novel  Coronavirus (2019 nCoV)    Coronavirus HKU1 NOT DETECTED NOT DETECTED   Coronavirus NL63 NOT DETECTED NOT DETECTED   Coronavirus OC43 NOT DETECTED NOT DETECTED   Metapneumovirus NOT DETECTED NOT DETECTED   Rhinovirus / Enterovirus NOT DETECTED NOT DETECTED   Influenza A NOT DETECTED NOT DETECTED   Influenza B NOT DETECTED NOT DETECTED   Parainfluenza Virus 1 NOT DETECTED NOT DETECTED   Parainfluenza Virus 2 NOT DETECTED NOT DETECTED   Parainfluenza Virus 3 NOT DETECTED NOT DETECTED   Parainfluenza Virus 4 NOT DETECTED NOT DETECTED   Respiratory Syncytial Virus NOT DETECTED NOT DETECTED   Bordetella pertussis NOT DETECTED NOT DETECTED   Bordetella Parapertussis NOT DETECTED NOT DETECTED   Chlamydophila pneumoniae NOT DETECTED NOT DETECTED   Mycoplasma pneumoniae NOT DETECTED NOT DETECTED    Comment: Performed at [facility], [address], [place], [place] [address]  Cortisol-am, blood     Status: Abnormal   Collection Time: 12/05/22  5:00 AM  Result Value Ref Range    Cortisol - AM 5.0 (L) 6.7 - 22.6 ug/dL    Comment: Performed at [facility], [address] [place], [place] [address]  TSH     Status: None   Collection Time: 12/05/22  5:00 AM  Result Value Ref Range   TSH 0.784 0.350 - 4.500 uIU/mL    Comment: Performed by a 3rd Generation assay with a functional sensitivity of <=0.01 uIU/mL. Performed at [facility], [address], [place], [place] [address]   T4, free     Status: Abnormal   Collection Time: 12/05/22  5:00 AM  Result Value Ref Range   Free T4 1.33 (H) 0.61 - 1.12 ng/dL    Comment: (NOTE) Biotin ingestion may interfere with free T4 tests. If the results are inconsistent with the TSH level, previous test results, or the clinical presentation, then consider biotin interference. If needed, order repeat testing after stopping biotin. Performed at [facility], [address] [place], [place] [address]   APTT     Status: None   Collection Time: 12/05/22  5:00 AM  Result Value Ref Range   aPTT 35 24 - 36 seconds    Comment: Performed at [facility], [address], [place], [place] [address]  Protime-INR     Status: Abnormal   Collection Time: 12/05/22  5:00 AM  Result Value Ref Range   Prothrombin Time 32.7 (H) 11.4 - 15.2 seconds   INR 3.2 (H) 0.8 - 1.2    Comment: (NOTE) INR goal varies based on device and disease states. Performed at [facility], [address], [place], [place] [address]   Basic metabolic panel     Status: Abnormal   Collection Time: 12/05/22  5:00 AM  Result Value Ref Range   Sodium 138 135 - 145 mmol/L   Potassium 3.0 (L) 3.5 - 5.1 mmol/L   Chloride 106 98 - 111 mmol/L   CO2 19 (L) 22 - 32 mmol/L   Glucose, Bld 184 (H) 70 - 99 mg/dL    Comment: Glucose reference range applies only to samples taken after fasting for at least 8 hours.   BUN 15 8 -  23 mg/dL   Creatinine, Ser 7.62 (H) 0.44 - 1.00 mg/dL   Calcium 6.7 (L) 8.9 -  10.3 mg/dL   GFR, Estimated 44 (L) >60 mL/min    Comment: (NOTE) Calculated using the CKD-EPI Creatinine Equation (2021)    Anion gap 13 5 - 15    Comment: Performed at [facility], [address] [place], [place] [address]  CBC     Status: Abnormal   Collection Time: 12/05/22  5:00 AM  Result Value Ref Range   WBC 2.7 (L) 4.0 - 10.5 K/uL   RBC 2.17 (L) 3.87 - 5.11 MIL/uL   Hemoglobin 7.6 (L) 12.0 - 15.0 g/dL   HCT 76.1 (L) 60.7 - 37.1 %   MCV 104.6 (H) 80.0 - 100.0 fL   MCH 35.0 (H) 26.0 - 34.0 pg   MCHC 33.5 30.0 - 36.0 g/dL   RDW 06.2 (H) 69.4 - 85.4 %   Platelets 168 150 - 400 K/uL   nRBC 0.0 0.0 - 0.2 %    Comment: Performed at [facility], [address], [place], [place] [address]  Magnesium     Status: Abnormal   Collection Time: 12/05/22  5:00 AM  Result Value Ref Range   Magnesium 0.9 (LL) 1.7 - 2.4 mg/dL    Comment: CRITICAL RESULT CALLED TO, READ BACK BY AND VERIFIED WITH DOWD, P RN AT 5009 ON 12/05/2022 BY Milly Jakob Performed at [facility], [address] [place], [place] [address]   Urinalysis, Complete w Microscopic -Urine, Clean Catch     Status: Abnormal   Collection Time: 12/05/22  7:00 AM  Result Value Ref Range   Color, Urine YELLOW YELLOW   APPearance CLEAR CLEAR   Specific Gravity, Urine >1.046 (H) 1.005 - 1.030   pH 5.0 5.0 - 8.0   Glucose, UA NEGATIVE NEGATIVE mg/dL   Hgb urine dipstick SMALL (A) NEGATIVE   Bilirubin Urine NEGATIVE NEGATIVE   Ketones, ur NEGATIVE NEGATIVE mg/dL   Protein, ur NEGATIVE NEGATIVE mg/dL   Nitrite NEGATIVE NEGATIVE   Leukocytes,Ua LARGE (A) NEGATIVE   RBC / HPF 0-5 0 - 5 RBC/hpf   WBC, UA 11-20 0 - 5 WBC/hpf   Bacteria, UA RARE (A) NONE SEEN   Squamous Epithelial / HPF 0-5 0 - 5 /HPF   Mucus PRESENT     Comment: Performed at [facility], [address], [place], [place] [address]  Chloride, urine, random     Status: None   Collection  Time: 12/05/22  7:00 AM  Result Value Ref Range   Chloride Urine 50 mmol/L    Comment: Performed at [facility], [address], [place], [place] [address]   *Note: Due to a large number of results and/or encounters for the requested time period, some results have not been displayed. A complete set of results can be found in Results Review.   CT Angio Chest PE W and/or Wo Contrast  Result Date: 12/04/2022 CLINICAL DATA:  Shortness of breath EXAM: CT ANGIOGRAPHY CHEST WITH CONTRAST TECHNIQUE: Multidetector CT imaging of the chest was performed using the standard protocol during bolus administration of intravenous contrast. Multiplanar CT image reconstructions and MIPs were obtained to evaluate the vascular anatomy. RADIATION DOSE REDUCTION: This exam was performed according to the departmental dose-optimization program which includes automated exposure control, adjustment of the mA and/or kV according to patient size and/or use of iterative reconstruction technique. CONTRAST:  75mL OMNIPAQUE IOHEXOL  350 MG/ML SOLN COMPARISON:  CTA chest dated November 17, 2022 FINDINGS: Cardiovascular: No evidence of pulmonary embolus. Normal heart size. No pericardial effusion. Normal caliber thoracic aorta with severe atherosclerotic disease. Moderate coronary artery calcifications. Mediastinum/Nodes: Moderate hiatal hernia. Thyroid is unremarkable. No enlarged lymph nodes seen in the chest. Lungs/Pleura: Central airways are patent. Moderate emphysema. Subpleural reticulations of the anterior right lung, consistent with postradiation change. Mild bilateral bronchial wall thickening. Unchanged clustered nodules and mucous plugging of the right lower lobe. New clustered solid nodule seen in the lingula. Reference new nodule measuring 4 mm on series 12, image 64. No pleural effusion or pneumothorax. Upper Abdomen: No acute abnormality. Musculoskeletal: Postsurgical changes of the right breast. No aggressive  appearing osseous lesions. Review of the MIP images confirms the above findings. IMPRESSION: 1. No evidence of pulmonary embolus. 2. Mild bilateral bronchial wall thickening, consistent with bronchitis. 3. New clustered solid nodules seen in the lingula and unchanged clustered nodules and mucous plugging of the right lower lobe, likely due to infection or aspiration. Recommend follow-up in 3 months to ensure resolution. 4. Moderate hiatal hernia. 5. Aortic Atherosclerosis (ICD10-I70.0) and Emphysema (ICD10-J43.9). Electronically Signed   By: Allegra Lai M.D.   On: 12/04/2022 19:40   DG Chest Port 1 View  Result Date: 12/04/2022 CLINICAL DATA:  Shortness of breath EXAM: PORTABLE CHEST 1 VIEW COMPARISON:  11/17/2022 FINDINGS: Heart and mediastinal contours within normal limits. No confluent airspace opacities or effusions. No acute bony abnormality. IMPRESSION: No active disease. Electronically Signed   By: Charlett Nose M.D.   On: 12/04/2022 18:26    Pending Labs Unresulted Labs (From admission, onward)     Start     Ordered   12/04/22 2124  C Difficile Quick Screen w PCR reflex  (C Difficile quick screen w PCR reflex panel )  Once, for 24 hours,   TIMED       References:    CDiff Information Tool   12/04/22 2123   12/04/22 2040  Calcium, ionized  ONCE - URGENT,   STAT        12/04/22 2039   12/04/22 2040  Parathyroid hormone, intact (no Ca)  ONCE - URGENT,   URGENT        12/04/22 2039   12/04/22 2038  Na and K (sodium & potassium), rand urine  Once,   URGENT        12/04/22 2037            Vitals/Pain Today's Vitals   12/05/22 1030 12/05/22 1100 12/05/22 1101 12/05/22 1130  BP: (!) 110/56 (!) 107/50  (!) 114/54  Pulse: 98 96  96  Resp: (!) 23 (!) 29  16  Temp:      TempSrc:      SpO2: 97% 97%  100%  Weight:   61.2 kg   Height:   5\' 3"  (1.6 m)   PainSc:        Isolation Precautions Enteric precautions (UV disinfection)  Medications Medications  sodium bicarbonate  tablet 650 mg (650 mg Oral Given 12/05/22 1121)  acetaminophen (TYLENOL) tablet 650 mg (has no administration in time range)    Or  acetaminophen (TYLENOL) suppository 650 mg (has no administration in time range)  polyethylene glycol (MIRALAX / GLYCOLAX) packet 17 g (has no administration in time range)  sodium chloride flush (NS) 0.9 % injection 3 mL (3 mLs Intravenous Given 12/05/22 1142)  anastrozole (ARIMIDEX) tablet 1 mg (1 mg Oral Given 12/05/22 1121)  aspirin chewable tablet 81 mg (81 mg Oral Patient Refused/Not Given 12/05/22 1143)  dexamethasone (DECADRON) tablet 2 mg (2 mg Oral Patient Refused/Not Given 12/05/22 1143)  sodium bicarbonate 150 mEq in dextrose 5 % 1,150 mL infusion ( Intravenous New Bag/Given 12/05/22 0821)  magnesium sulfate IVPB 2 g 50 mL (2 g Intravenous New Bag/Given 12/05/22 1126)  potassium chloride 10 mEq in 100 mL IVPB (10 mEq Intravenous New Bag/Given 12/05/22 1132)  ALPRAZolam (XANAX) tablet 0.25 mg (0.25 mg Oral Given 12/05/22 0913)  rivaroxaban (XARELTO) tablet 20 mg (has no administration in time range)  iohexol (OMNIPAQUE) 350 MG/ML injection 75 mL (75 mLs Intravenous Contrast Given 12/04/22 1751)  calcium gluconate 1 g/ 50 mL sodium chloride IVPB (0 mg Intravenous Stopped 12/04/22 1912)  sodium chloride 0.9 % bolus 500 mL (0 mLs Intravenous Stopped 12/04/22 1912)  piperacillin-tazobactam (ZOSYN) IVPB 3.375 g (0 g Intravenous Stopped 12/05/22 0002)  potassium chloride (KLOR-CON) packet 40 mEq (40 mEq Oral Given 12/04/22 2252)  magnesium sulfate IVPB 2 g 50 mL (0 g Intravenous Stopped 12/05/22 0106)  calcium gluconate 2 g/ 100 mL sodium chloride IVPB (0 mg Intravenous Stopped 12/05/22 0228)  diazepam (VALIUM) tablet 5 mg (5 mg Oral Given 12/05/22 0122)  potassium chloride SA (KLOR-CON M) CR tablet 40 mEq (40 mEq Oral Given 12/05/22 0914)    Mobility non-ambulatory

## 2022-12-05 NOTE — ED Notes (Signed)
[facility]ospital[facility] at bedside.

## 2022-12-06 DIAGNOSIS — J9601 Acute respiratory failure with hypoxia: Secondary | ICD-10-CM | POA: Diagnosis not present

## 2022-12-06 LAB — COMPREHENSIVE METABOLIC PANEL
ALT: 11 U/L (ref 0–44)
ALT: 13 U/L (ref 0–44)
AST: 21 U/L (ref 15–41)
AST: 22 U/L (ref 15–41)
Albumin: 2.4 g/dL — ABNORMAL LOW (ref 3.5–5.0)
Albumin: 2.5 g/dL — ABNORMAL LOW (ref 3.5–5.0)
Alkaline Phosphatase: 39 U/L (ref 38–126)
Alkaline Phosphatase: 42 U/L (ref 38–126)
Anion gap: 11 (ref 5–15)
Anion gap: 16 — ABNORMAL HIGH (ref 5–15)
BUN: 15 mg/dL (ref 8–23)
BUN: 13 mg/dL (ref 8–23)
CO2: 29 mmol/L (ref 22–32)
CO2: 28 mmol/L (ref 22–32)
Calcium: 6 mg/dL — CL (ref 8.9–10.3)
Calcium: 6.6 mg/dL — ABNORMAL LOW (ref 8.9–10.3)
Chloride: 99 mmol/L (ref 98–111)
Chloride: 101 mmol/L (ref 98–111)
Creatinine, Ser: 1.3 mg/dL — ABNORMAL HIGH (ref 0.44–1.00)
Creatinine, Ser: 1.43 mg/dL — ABNORMAL HIGH (ref 0.44–1.00)
GFR, Estimated: 41 mL/min — ABNORMAL LOW (ref >60)
GFR, Estimated: 37 mL/min — ABNORMAL LOW (ref >60)
Glucose, Bld: 142 mg/dL — ABNORMAL HIGH (ref 70–99)
Glucose, Bld: 114 mg/dL — ABNORMAL HIGH (ref 70–99)
Potassium: 3 mmol/L — ABNORMAL LOW (ref 3.5–5.1)
Potassium: 3.2 mmol/L — ABNORMAL LOW (ref 3.5–5.1)
Sodium: 139 mmol/L (ref 135–145)
Sodium: 145 mmol/L (ref 135–145)
Total Bilirubin: 0.6 mg/dL (ref 0.3–1.2)
Total Bilirubin: 0.6 mg/dL (ref 0.3–1.2)
Total Protein: 5.3 g/dL — ABNORMAL LOW (ref 6.5–8.1)
Total Protein: 5.7 g/dL — ABNORMAL LOW (ref 6.5–8.1)

## 2022-12-06 LAB — HEMOGLOBIN AND HEMATOCRIT, BLOOD
HCT: 28 % — ABNORMAL LOW (ref 36.0–46.0)
Hemoglobin: 9.3 g/dL — ABNORMAL LOW (ref 12.0–15.0)

## 2022-12-06 LAB — PREPARE RBC (CROSSMATCH)

## 2022-12-06 LAB — RETICULOCYTES
Immature Retic Fract: 18.4 % — ABNORMAL HIGH (ref 2.3–15.9)
RBC.: 1.95 MIL/uL — ABNORMAL LOW (ref 3.87–5.11)
Retic Count, Absolute: 29.3 10*3/uL (ref 19.0–186.0)
Retic Ct Pct: 1.5 % (ref 0.4–3.1)

## 2022-12-06 LAB — CBC
HCT: 19.3 % — ABNORMAL LOW (ref 36.0–46.0)
Hemoglobin: 6.5 g/dL — CL (ref 12.0–15.0)
MCH: 35.5 pg — ABNORMAL HIGH (ref 26.0–34.0)
MCHC: 33.7 g/dL (ref 30.0–36.0)
MCV: 105.5 fL — ABNORMAL HIGH (ref 80.0–100.0)
Platelets: 147 10*3/uL — ABNORMAL LOW (ref 150–400)
RBC: 1.83 MIL/uL — ABNORMAL LOW (ref 3.87–5.11)
RDW: 15.6 % — ABNORMAL HIGH (ref 11.5–15.5)
WBC: 3.6 10*3/uL — ABNORMAL LOW (ref 4.0–10.5)
nRBC: 0 % (ref 0.0–0.2)

## 2022-12-06 LAB — IRON AND TIBC
Iron: 45 ug/dL (ref 28–170)
Saturation Ratios: 20 % (ref 10.4–31.8)
TIBC: 221 ug/dL — ABNORMAL LOW (ref 250–450)
UIBC: 176 ug/dL

## 2022-12-06 LAB — MAGNESIUM: Magnesium: 1.7 mg/dL (ref 1.7–2.4)

## 2022-12-06 LAB — VITAMIN B12: Vitamin B-12: 702 pg/mL (ref 180–914)

## 2022-12-06 LAB — OCCULT BLOOD X 1 CARD TO LAB, STOOL: Fecal Occult Bld: POSITIVE — AB

## 2022-12-06 LAB — CALCIUM, IONIZED: Calcium, Ionized, Serum: 3.6 mg/dL — ABNORMAL LOW (ref 4.5–5.6)

## 2022-12-06 LAB — ABO/RH: ABO/RH(D): A NEG

## 2022-12-06 LAB — FOLATE: Folate: 12.9 ng/mL (ref >5.9)

## 2022-12-06 LAB — FERRITIN: Ferritin: 39 ng/mL (ref 11–307)

## 2022-12-06 MED ORDER — ALBUTEROL SULFATE (2.5 MG/3ML) 0.083% IN NEBU: 2.5000 mg | INHALATION_SOLUTION | RESPIRATORY_TRACT | Status: DC | PRN

## 2022-12-06 MED ORDER — POTASSIUM CHLORIDE CRYS ER 20 MEQ PO TBCR
40.0000 meq | EXTENDED_RELEASE_TABLET | Freq: Once | ORAL | Status: AC
  Administered 2022-12-06: 40 meq via ORAL
  Filled 2022-12-06: qty 2

## 2022-12-06 MED ORDER — LOPERAMIDE HCL 2 MG PO CAPS
2.0000 mg | ORAL_CAPSULE | Freq: Four times a day (QID) | ORAL | Status: DC | PRN
  Administered 2022-12-06 (×2): 2 mg via ORAL
  Filled 2022-12-06 (×2): qty 1

## 2022-12-06 MED ORDER — LOPERAMIDE HCL 2 MG PO CAPS: 4.0000 mg | ORAL_CAPSULE | Freq: Three times a day (TID) | ORAL | Status: DC

## 2022-12-06 MED ORDER — SODIUM CHLORIDE 0.9 % IV SOLN
3.0000 g | Freq: Two times a day (BID) | INTRAVENOUS | Status: DC
  Administered 2022-12-07 – 2022-12-08 (×3): 3 g via INTRAVENOUS
  Filled 2022-12-06 (×3): qty 8

## 2022-12-06 MED ORDER — CALCIUM GLUCONATE-NACL 2-0.675 GM/100ML-% IV SOLN
2.0000 g | Freq: Once | INTRAVENOUS | Status: AC
  Administered 2022-12-06: 2000 mg via INTRAVENOUS
  Filled 2022-12-06: qty 100

## 2022-12-06 MED ORDER — CALCIUM GLUCONATE-NACL 1-0.675 GM/50ML-% IV SOLN: 1.0000 g | Freq: Once | INTRAVENOUS | Status: DC

## 2022-12-06 MED ORDER — SODIUM CHLORIDE 0.9% IV SOLUTION: Freq: Once | INTRAVENOUS | Status: AC

## 2022-12-06 MED ORDER — LOPERAMIDE HCL 2 MG PO CAPS
4.0000 mg | ORAL_CAPSULE | Freq: Three times a day (TID) | ORAL | Status: AC
  Administered 2022-12-06 – 2022-12-08 (×4): 4 mg via ORAL
  Filled 2022-12-06 (×5): qty 2

## 2022-12-06 NOTE — Progress Notes (Signed)
    Patient Name: Stacey [name]           DOB: 1940/09/29  MRN: [id number]      Admission Date: 12/04/2022  Attending Provider: Alwyn Ren, MD  Primary Diagnosis: Pneumonia   Level of care: Progressive    CROSS COVER NOTE   Date of Service   12/06/2022   Stacey [name], 82 y.o. female, was admitted on 12/04/2022 for Pneumonia.    HPI/Events of Note   Anemia, chronic   Hemoglobin 7.6 -->  6.5.  No acute changes reported.  Hemodynamically stable. No melena, hematochezia, or other bleeding reported tonight.    Interventions/ Plan   Blood transfusion, 1 unit PRBC Recheck H&H after transfusion is completed, transfuse if HGB <7 Anemia Panel         Anthoney Harada, DNP, ACNPC- AG [facility] [place]

## 2022-12-06 NOTE — Evaluation (Signed)
Physical Therapy Evaluation Patient Details Name: Stacey [name] MRN: [id number] DOB: Apr 19, 1940 Today's Date: 12/06/2022  History of Present Illness  Pt is an 82 yo female with recent admission on 11/17/22 for sepsis, and pt discharged home.  Pt admitted again on 12/04/22 for acute hypoxic respiratory failure secondary to aspiration pneumonia.  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  Pt currently with functional limitations due to the deficits listed below (see PT Problem List). Pt will benefit from acute skilled PT to increase their independence and safety with mobility to allow discharge.  Pt reports she has only been declining with mobility and self care since returning home after previous admission.  Pt does not feel she can return home alone and manage with only HHPT this time.  Patient will benefit from continued inpatient follow up therapy, <3 hours/day.          If plan is discharge home, recommend the following: Assistance with cooking/housework;Help with stairs or ramp for entrance;A little help with walking and/or transfers;A little help with bathing/dressing/bathroom   Can travel by private vehicle   Yes    Equipment Recommendations None recommended by PT  Recommendations for Other Services       Functional Status Assessment Patient has had a recent decline in their functional status and demonstrates the ability to make significant improvements in function in a reasonable and predictable amount of time.     Precautions / Restrictions Precautions Precautions: Fall Precaution Comments: monitor sats, chronic diarrhea      Mobility  Bed Mobility Overal bed mobility: Needs Assistance Bed Mobility: Supine to Sit     Supine to sit: Contact guard, HOB elevated, Used rails     General bed mobility comments: pt stating she would need assist  however able to perform without physical assist with cues and use of rail and elevated HOB    Transfers Overall transfer level: Needs assistance Equipment used: Rolling walker (2 wheels) Transfers: Sit to/from Stand Sit to Stand: Min assist   Step pivot transfers: Min assist       General transfer comment: verbal cues for hand placement and weight shifting, assist to rise and steady, pt felt urgent need for BM and returned to sitting; BSC provided and pt performed step pivot transfer over to [facility], remained on 2L O2 [place] and SpO2 91%; pt requested RN or NT assist thereafter (pt has been getting to [facility] frequently today due to chronic diarrhea)    Ambulation/Gait                  Stairs            Wheelchair Mobility     Tilt Bed    Modified Rankin (Stroke Patients Only)       Balance                                             Pertinent Vitals/Pain Pain Assessment Pain Assessment: No/denies pain    Home Living Family/patient expects to be discharged to:: Private residence Living Arrangements: Alone Available Help at Discharge: Family;Available PRN/intermittently Type of Home: House Home Access: Stairs to enter   Entrance Stairs-Number of Steps: 1   Home Layout: One level Home Equipment: BSC/3in1;Shower seat;Rolling  Walker (2 wheels) Additional Comments: stepdaughter was helping after recent admission    Prior Function Prior Level of Function : Independent/Modified Independent;Driving             Mobility Comments: ambulating with RW, had started HHPT since recend hospital admission approx 2 weeks ago ADLs Comments: reports ADLs and IADLs were not getting done since discharge home     Extremity/Trunk Assessment   Upper Extremity Assessment Upper Extremity Assessment: Defer to OT evaluation    Lower Extremity Assessment Lower Extremity Assessment: Generalized weakness    Cervical / Trunk Assessment Cervical / Trunk  Assessment: Normal  Communication   Communication Communication: No apparent difficulties  Cognition Arousal: Alert Behavior During Therapy: WFL for tasks assessed/performed Overall Cognitive Status: Within Functional Limits for tasks assessed                                          General Comments      Exercises     Assessment/Plan    PT Assessment Patient needs continued PT services  PT Problem List Decreased strength;Cardiopulmonary status limiting activity;Decreased range of motion;Decreased activity tolerance;Decreased balance;Decreased knowledge of use of DME;Decreased safety awareness;Decreased mobility       PT Treatment Interventions DME instruction;Therapeutic exercise;Gait training;Balance training;Neuromuscular re-education;Functional mobility training;Therapeutic activities;Patient/family education    PT Goals (Current goals can be found in the Care Plan section)  Acute Rehab PT Goals PT Goal Formulation: With patient Time For Goal Achievement: 12/20/22 Potential to Achieve Goals: Good    Frequency Min 1X/week     Co-evaluation               AM-PAC PT "6 Clicks" Mobility  Outcome Measure Help needed turning from your back to your side while in a flat bed without using bedrails?: A Little Help needed moving from lying on your back to sitting on the side of a flat bed without using bedrails?: A Little Help needed moving to and from a bed to a chair (including a wheelchair)?: A Little Help needed standing up from a chair using your arms (e.g., wheelchair or bedside chair)?: A Little Help needed to walk in hospital room?: A Lot Help needed climbing 3-5 steps with a railing? : A Lot 6 Click Score: 16    End of Session Equipment Utilized During Treatment: Oxygen Activity Tolerance: Patient tolerated treatment well Patient left: with call bell/phone within reach;with nursing/sitter in room;Other (comment) (with RN and pt on [facility]) Nurse  Communication: Mobility status PT Visit Diagnosis: Other abnormalities of gait and mobility (R26.89);Muscle weakness (generalized) (M62.81)    Time: 1761-6073 PT Time Calculation (min) (ACUTE ONLY): 19 min   Charges:   PT Evaluation $PT Eval Low Complexity: 1 Low   PT General Charges $$ ACUTE PT VISIT: 1 Visit        Thomasene Mohair PT, DPT Physical Therapist Acute Rehabilitation Services Office: [phone]   Kati L Payson 12/06/2022, 4:08 PM

## 2022-12-06 NOTE — Progress Notes (Signed)
Pere tele at 1735, patient had an 18 beat run of SVT at 1332. Pt did not complain of any symptoms at the time. VS have since been stable. Pt has had 4-5 watery BMs this shift. MD Jerolyn Center aware of above.

## 2022-12-06 NOTE — Progress Notes (Signed)
OT Cancellation Note  Patient Details Name: Stacey [name] MRN: [id number] DOB: March 25, 1940   Cancelled Treatment:    Reason Eval/Treat Not Completed: Medical issues which prohibited therapy RN setting blood up for pt.  RN reports up and down to [facility] with A.  Will check on pt later in day or next day.  Alba Cory 12/06/2022, 11:08 AM

## 2022-12-06 NOTE — Progress Notes (Signed)
PHARMACY NOTE:  ANTIMICROBIAL RENAL DOSAGE ADJUSTMENT  Current antimicrobial regimen includes a mismatch between antimicrobial dosage and estimated renal function.  As per policy approved by the Pharmacy & Therapeutics and Medical Executive Committees, the antimicrobial dosage will be adjusted accordingly.  Current antimicrobial dosage:  Unasyn 3 g IV q6h  Indication: aspiration pneumonia  Renal Function:  Estimated Creatinine Clearance: 27.5 mL/min (A) (by C-G formula based on SCr of 1.43 mg/dL (H)).     Antimicrobial dosage has been changed to:  Unasyn 3 g IV q12h    Thank you for allowing pharmacy to be a part of this patient's care.  Pricilla Riffle, PharmD, BCPS Clinical Pharmacist 12/06/2022 7:16 PM

## 2022-12-06 NOTE — Plan of Care (Signed)

## 2022-12-06 NOTE — Progress Notes (Signed)
PROGRESS NOTE    Stacey [name]  NWG:[id number] DOB: May 08, 1940 DOA: 12/04/2022 PCP: [name], MD   Brief Narrative: 82 y.o. female with medical history significant of metastaic breast ca for which patien is on  ibrance. However no iv chemotherapy.  Patient was discharged from the [facility] on October 18, approximately 2 weeks ago after treatment for sepsis secondary to pneumonia that was present on admission affecting the right lower lobe.   However patient states that when she was discharged she finished her antibiotics.  She has continued to have some sensation of shortness of breath, and generalized weakness since then.  This is present when she attempts to exert herself such as walk.  Which has become increasingly difficult.  Patient does not distinguish between sensation of fatigue or generalized weakness versus shortness of breath.  Patient certainly does not have any chest pain or vomiting or fever or palpitation or syncope or fall.  Patient does have a dry cough that is rather minimal per patient.  Patient apparently was so weak this morning that she could not get out of bed prompting her stepdaughter to seek EMS help to bring the patient to the hospital.   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  Patient takes Imodium occasionally for that.  Further when EMS got there, patient was put on 2 L/min of supplementary oxygen, patient may have been hypoxic, although this has not been clearly documented.  During this encounter I took the patient off of supplementary oxygen and patient has maintained sats over 90%.  As of my encounter with the patient patient pending zosyn.  Cultures have been drawn. Medical eval is sought.  Assessment & Plan:   Principal Problem:   Pneumonia Active Problems:   HTN (hypertension)   Recurrent cancer of left breast (HCC)   Metabolic acidosis   Hypocalcemia   Fatigue    Immunosuppression due to chronic steroid use (HCC)   DVT (deep venous thrombosis) (HCC)   Diarrhea   Acute respiratory failure with hypoxia (HCC)  #1 acute hypoxic respiratory failure secondary to aspiration pneumonia patient admitted with shortness of breath and cough.  CT chest no pulmonary embolism.  Mild bilateral bronchial wall thickening consistent with bronchitis.  New clustered solid nodule seen in the lingula and unchanged clustered nodules and mucous plugging of the right lower lobe likely due to infection or aspiration.  Respiratory virus panel negative.  COVID negative. Flu was neg -2 weeks ago. Patient on 2 L of oxygen to maintain sats above 92%. Continue Zosyn. 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 Difficile negative   #4 CKD with metabolic acidosis resolved  #5 stage IV recurrent left breast cancer continue Arimidex and Ibrance  #6 history of hypertension blood pressure running soft will monitor continue IV fluids  #7 severe anxiety does not take anything at home we will start her on low-dose Xanax.   #8 multiple electrolyte abnormalities hypokalemia/hypomagnesemia/hypocalcemia will replete or this is likely from ongoing chronic diarrhea.  #9 acute on chronic anemia of chronic disease and malignancy hemoglobin dropped to 6.5 from 7.6 with no evidence of active bleeding.  This is likely due to hemodilution as she was getting IV fluids.  However we will give her 1 unit of blood discussed with  patient Fobt    Estimated body mass index is 25.38 kg/m as calculated from the following:   Height as of this encounter: 5\' 3"  (1.6 m).   Weight as of this encounter: 65 kg.  DVT prophylaxis: Xarelto code Status: Full code Family Communication: None at bedside Disposition Plan:  Status is: Inpatient Remains inpatient  appropriate because: Acute hypoxia   Consultants:  None  Procedures: None  Antimicrobials Unasyn  Subjective:  Less anxious today still requiring 2 L of oxygen  Objective: Vitals:   12/06/22 0102 12/06/22 0524 12/06/22 1115 12/06/22 1145  BP: (!) 126/57 (!) 109/58 (!) 105/50 105/72  Pulse: 94 82 87 91  Resp: 19 16 16 18   Temp: 98.4 F (36.9 C) 98.4 F (36.9 C) 98.1 F (36.7 C) 98 F (36.7 C)  TempSrc: Oral Oral Oral Oral  SpO2: 96% 95% 98% 98%  Weight:  65 kg    Height:        Intake/Output Summary (Last 24 hours) at 12/06/2022 1159 Last data filed at 12/06/2022 0900 Gross per 24 hour  Intake 4782.34 ml  Output --  Net 4782.34 ml   Filed Weights   12/05/22 1101 12/05/22 1245 12/06/22 0524  Weight: 61.2 kg 64.2 kg 65 kg    Examination:  General exam: Appears in nad respiratory system: Few rhonchi to auscultation. Respiratory effort normal. Cardiovascular system: S1 & S2 heard, RRR. No JVD, murmurs, rubs, gallops or clicks. No pedal edema. Gastrointestinal system: Abdomen is nondistended, soft and nontender. No organomegaly or masses felt. Normal bowel sounds heard. Central nervous system: Alert and oriented.  Extremities: Right lower extremity edema more than left chronic   Data Reviewed: I have personally reviewed following labs and imaging studies  CBC: Recent Labs  Lab 12/04/22 1553 12/05/22 0500 12/06/22 0432  WBC 3.4* 2.7* 3.6*  NEUTROABS 2.5  --   --   HGB 8.3* 7.6* 6.5*  HCT 25.2* 22.7* 19.3*  MCV 107.2* 104.6* 105.5*  PLT 185 168 147*   Basic Metabolic Panel: Recent Labs  Lab 12/04/22 1553 12/04/22 2050 12/05/22 0500 12/05/22 1626 12/06/22 0432  NA 142 143 138 140 139  K 3.4* 2.9* 3.0* 3.5 3.0*  CL 112* 114* 106 104 99  CO2 17* 16* 19* 24 29  GLUCOSE 110* 112* 184* 170* 142*  BUN 20 19 15 14 15   CREATININE 1.46* 1.22* 1.23* 1.13* 1.30*  CALCIUM 6.3* 6.2* 6.7* 6.5* 6.0*  MG  --  <0.5* 0.9* 1.9 1.7  PHOS  --  3.9  --   --   --     GFR: Estimated Creatinine Clearance: 30.2 mL/min (A) (by C-G formula based on SCr of 1.3 mg/dL (H)). Liver Function Tests: Recent Labs  Lab 12/04/22 1553 12/04/22 2050 12/05/22 1626 12/06/22 0432  AST 22  --  18 21  ALT 14  --  11 11  ALKPHOS 46  --  45 39  BILITOT 0.8  --  0.3 0.6  PROT 6.7  --  5.9* 5.3*  ALBUMIN 3.0* 2.9* 2.7* 2.4*   No results for input(s): "LIPASE", "AMYLASE" in the last 168 hours. No results for input(s): "AMMONIA" in the last 168 hours. Coagulation Profile: Recent Labs  Lab 12/05/22 0500  INR 3.2*   Cardiac Enzymes: No results for input(s): "CKTOTAL", "CKMB", "CKMBINDEX", "TROPONINI" in the last 168 hours. BNP (last 3 results) No results for input(s): "PROBNP" in the last 8760 hours. HbA1C: No results for input(s): "HGBA1C" in the last 72 hours. CBG:  No results for input(s): "GLUCAP" in the last 168 hours. Lipid Profile: No results for input(s): "CHOL", "HDL", "LDLCALC", "TRIG", "CHOLHDL", "LDLDIRECT" in the last 72 hours. Thyroid Function Tests: Recent Labs    12/05/22 0500  TSH 0.784  FREET4 1.33*   Anemia Panel: Recent Labs    12/06/22 0714  VITAMINB12 702  FOLATE 12.9  FERRITIN 39  TIBC 221*  IRON 45  RETICCTPCT 1.5   Sepsis Labs: Recent Labs  Lab 12/04/22 1609 12/04/22 1745 12/04/22 2050  PROCALCITON  --   --  <0.10  LATICACIDVEN 1.1 0.6  --     Recent Results (from the past 240 hour(s))  Culture, blood (routine x 2)     Status: None (Preliminary result)   Collection Time: 12/04/22  3:42 PM   Specimen: BLOOD  Result Value Ref Range Status   Specimen Description   Final    BLOOD RIGHT ANTECUBITAL Performed at [facility], [address], [place], [place] [address]    Special Requests   Final    BOTTLES DRAWN AEROBIC AND ANAEROBIC Blood Culture adequate volume Performed at [facility], [address] [place], [place] [address]    Culture   Final    NO GROWTH 2  DAYS Performed at [facility], [address], [place], [place] [address]    Report Status PENDING  Incomplete  Culture, blood (routine x 2)     Status: None (Preliminary result)   Collection Time: 12/04/22  4:39 PM   Specimen: BLOOD RIGHT HAND  Result Value Ref Range Status   Specimen Description   Final    BLOOD RIGHT HAND Performed at [facility], [address], [place], [place] [address]    Special Requests   Final    BOTTLES DRAWN AEROBIC ONLY Blood Culture results may not be optimal due to an inadequate volume of blood received in culture bottles Performed at [facility], [address], [place], [place] [address]    Culture   Final    NO GROWTH 2 DAYS Performed at [facility], [address] [place], [place] [address]    Report Status PENDING  Incomplete  SARS Coronavirus 2 by RT PCR (hospital order, performed in [facility] hospital lab) *cepheid single result test* Anterior Nasal Swab     Status: None   Collection Time: 12/04/22  9:37 PM   Specimen: Anterior Nasal Swab  Result Value Ref Range Status   SARS Coronavirus 2 by RT PCR NEGATIVE NEGATIVE Final    Comment: (NOTE) SARS-CoV-2 target nucleic acids are NOT DETECTED.  The SARS-CoV-2 RNA is generally detectable in upper and lower respiratory specimens during the acute phase of infection. The lowest concentration of SARS-CoV-2 viral copies this assay can detect is 250 copies / mL. A negative result does not preclude SARS-CoV-2 infection and should not be used as the sole basis for treatment or other patient management decisions.  A negative result may occur with improper specimen collection / handling, submission of specimen other than nasopharyngeal swab, presence of viral mutation(s) within the areas targeted by this assay, and inadequate number of viral copies (<250 copies / mL). A negative result must be combined with clinical observations, patient history, and  epidemiological information.  Fact Sheet for Patients:   RoadLapTop.co.za  Fact Sheet for Healthcare Providers: http://kim-miller.com/  This test is not yet approved or  cleared by the [place] FDA and has been authorized for  detection and/or diagnosis of SARS-CoV-2 by FDA under an Emergency Use Authorization (EUA).  This EUA will remain in effect (meaning this test can be used) for the duration of the COVID-19 declaration under Section 564(b)(1) of the Act, 21 U.S.C. section 360bbb-3(b)(1), unless the authorization is terminated or revoked sooner.  Performed at [facility], [address], [place], [place] [address]   Respiratory (~20 pathogens) panel by PCR     Status: None   Collection Time: 12/04/22  9:37 PM   Specimen: Anterior Nasal Swab; Respiratory  Result Value Ref Range Status   Adenovirus NOT DETECTED NOT DETECTED Final   Coronavirus 229E NOT DETECTED NOT DETECTED Final    Comment: (NOTE) The Coronavirus on the Respiratory Panel, DOES NOT test for the novel  Coronavirus (2019 nCoV)    Coronavirus HKU1 NOT DETECTED NOT DETECTED Final   Coronavirus NL63 NOT DETECTED NOT DETECTED Final   Coronavirus OC43 NOT DETECTED NOT DETECTED Final   Metapneumovirus NOT DETECTED NOT DETECTED Final   Rhinovirus / Enterovirus NOT DETECTED NOT DETECTED Final   Influenza A NOT DETECTED NOT DETECTED Final   Influenza B NOT DETECTED NOT DETECTED Final   Parainfluenza Virus 1 NOT DETECTED NOT DETECTED Final   Parainfluenza Virus 2 NOT DETECTED NOT DETECTED Final   Parainfluenza Virus 3 NOT DETECTED NOT DETECTED Final   Parainfluenza Virus 4 NOT DETECTED NOT DETECTED Final   Respiratory Syncytial Virus NOT DETECTED NOT DETECTED Final   Bordetella pertussis NOT DETECTED NOT DETECTED Final   Bordetella Parapertussis NOT DETECTED NOT DETECTED Final   Chlamydophila pneumoniae NOT DETECTED NOT DETECTED Final   Mycoplasma  pneumoniae NOT DETECTED NOT DETECTED Final    Comment: Performed at [facility], [address], [place], [place] [address]  C Difficile Quick Screen w PCR reflex     Status: None   Collection Time: 12/05/22 10:17 PM  Result Value Ref Range Status   C Diff antigen NEGATIVE NEGATIVE Final   C Diff toxin NEGATIVE NEGATIVE Final   C Diff interpretation No C. difficile detected.  Final    Comment: Performed at [facility], [address], [place], [place] [address]         Radiology Studies: ECHOCARDIOGRAM COMPLETE  Result Date: 12/05/2022    ECHOCARDIOGRAM REPORT   Patient Name:   Stacey [name] Date of Exam: 12/05/2022 Medical Rec #:  [id number]    Height:       63.0 in Accession #:    [id number]   Weight:       138.0 lb Date of Birth:  1940/05/29    BSA:          1.652 m Patient Age:    82 years     BP:           108/56 mmHg Patient Gender: F            HR:           90 bpm. Exam Location:  Inpatient Procedure: 2D Echo, Cardiac Doppler and Color Doppler Indications:    CHF Acute Systolic I50.21  History:        Patient has prior history of Echocardiogram examinations, most                 recent 05/25/2017. COPD; Risk Factors:Dyslipidemia.  Sonographer:    Harriette Bouillon RDCS Referring Phys: [id number] [facility] GOEL IMPRESSIONS  1. Left ventricular ejection fraction, by estimation, is 60 to 65%.  The left ventricle has normal function. The left ventricle has no regional wall motion abnormalities. There is mild concentric left ventricular hypertrophy. Left ventricular diastolic parameters are consistent with Grade I diastolic dysfunction (impaired relaxation). Elevated left atrial pressure.  2. Right ventricular systolic function is normal. The right ventricular size is normal.  3. The mitral valve is normal in structure. No evidence of mitral valve regurgitation. No evidence of mitral stenosis.  4. The aortic valve is tricuspid. Aortic valve regurgitation is not visualized. No aortic  stenosis is present. FINDINGS  Left Ventricle: Left ventricular ejection fraction, by estimation, is 60 to 65%. The left ventricle has normal function. The left ventricle has no regional wall motion abnormalities. The left ventricular internal cavity size was normal in size. There is  mild concentric left ventricular hypertrophy. Left ventricular diastolic parameters are consistent with Grade I diastolic dysfunction (impaired relaxation). Elevated left atrial pressure. Right Ventricle: The right ventricular size is normal. Right ventricular systolic function is normal. Left Atrium: Left atrial size was normal in size. Right Atrium: Right atrial size was normal in size. Pericardium: There is no evidence of pericardial effusion. Mitral Valve: The mitral valve is normal in structure. Mild mitral annular calcification. No evidence of mitral valve regurgitation. No evidence of mitral valve stenosis. Tricuspid Valve: The tricuspid valve is normal in structure. Tricuspid valve regurgitation is mild . No evidence of tricuspid stenosis. Aortic Valve: The aortic valve is tricuspid. Aortic valve regurgitation is not visualized. No aortic stenosis is present. Pulmonic Valve: The pulmonic valve was normal in structure. Pulmonic valve regurgitation is trivial. No evidence of pulmonic stenosis. Aorta: The aortic root is normal in size and structure. Venous: The inferior vena cava was not well visualized. IAS/Shunts: The interatrial septum was not well visualized.  LEFT VENTRICLE PLAX 2D LVIDd:         3.30 cm   Diastology LVIDs:         2.10 cm   LV e' medial:    5.44 cm/s LV PW:         1.20 cm   LV E/e' medial:  17.5 LV IVS:        1.20 cm   LV e' lateral:   7.18 cm/s LVOT diam:     2.00 cm   LV E/e' lateral: 13.3 LV SV:         63 LV SV Index:   38 LVOT Area:     3.14 cm  RIGHT VENTRICLE TAPSE (M-mode): 1.5 cm LEFT ATRIUM             Index LA diam:        2.10 cm 1.27 cm/m LA Vol (A2C):   39.2 ml 23.74 ml/m LA Vol (A4C):    49.1 ml 29.73 ml/m LA Biplane Vol: 46.1 ml 27.91 ml/m  AORTIC VALVE LVOT Vmax:   99.20 cm/s LVOT Vmean:  73.400 cm/s LVOT VTI:    0.202 m  AORTA Ao Root diam: 3.00 cm Ao Asc diam:  3.30 cm MITRAL VALVE               TRICUSPID VALVE MV Area (PHT): 3.99 cm    TR Peak grad:   37.5 mmHg MV Decel Time: 190 msec    TR Vmax:        306.00 cm/s MV E velocity: 95.20 cm/s MV A velocity: 99.50 cm/s  SHUNTS MV E/A ratio:  0.96        Systemic VTI:  0.20 m  Systemic Diam: 2.00 cm Olga Millers MD Electronically signed by Olga Millers MD Signature Date/Time: 12/05/2022/2:27:54 PM    Final    CT Angio Chest PE W and/or Wo Contrast  Result Date: 12/04/2022 CLINICAL DATA:  Shortness of breath EXAM: CT ANGIOGRAPHY CHEST WITH CONTRAST TECHNIQUE: Multidetector CT imaging of the chest was performed using the standard protocol during bolus administration of intravenous contrast. Multiplanar CT image reconstructions and MIPs were obtained to evaluate the vascular anatomy. RADIATION DOSE REDUCTION: This exam was performed according to the departmental dose-optimization program which includes automated exposure control, adjustment of the mA and/or kV according to patient size and/or use of iterative reconstruction technique. CONTRAST:  75mL OMNIPAQUE IOHEXOL 350 MG/ML SOLN COMPARISON:  CTA chest dated November 17, 2022 FINDINGS: Cardiovascular: No evidence of pulmonary embolus. Normal heart size. No pericardial effusion. Normal caliber thoracic aorta with severe atherosclerotic disease. Moderate coronary artery calcifications. Mediastinum/Nodes: Moderate hiatal hernia. Thyroid is unremarkable. No enlarged lymph nodes seen in the chest. Lungs/Pleura: Central airways are patent. Moderate emphysema. Subpleural reticulations of the anterior right lung, consistent with postradiation change. Mild bilateral bronchial wall thickening. Unchanged clustered nodules and mucous plugging of the right lower lobe. New  clustered solid nodule seen in the lingula. Reference new nodule measuring 4 mm on series 12, image 64. No pleural effusion or pneumothorax. Upper Abdomen: No acute abnormality. Musculoskeletal: Postsurgical changes of the right breast. No aggressive appearing osseous lesions. Review of the MIP images confirms the above findings. IMPRESSION: 1. No evidence of pulmonary embolus. 2. Mild bilateral bronchial wall thickening, consistent with bronchitis. 3. New clustered solid nodules seen in the lingula and unchanged clustered nodules and mucous plugging of the right lower lobe, likely due to infection or aspiration. Recommend follow-up in 3 months to ensure resolution. 4. Moderate hiatal hernia. 5. Aortic Atherosclerosis (ICD10-I70.0) and Emphysema (ICD10-J43.9). Electronically Signed   By: Allegra Lai M.D.   On: 12/04/2022 19:40   DG Chest Port 1 View  Result Date: 12/04/2022 CLINICAL DATA:  Shortness of breath EXAM: PORTABLE CHEST 1 VIEW COMPARISON:  11/17/2022 FINDINGS: Heart and mediastinal contours within normal limits. No confluent airspace opacities or effusions. No acute bony abnormality. IMPRESSION: No active disease. Electronically Signed   By: Charlett Nose M.D.   On: 12/04/2022 18:26        Scheduled Meds:  anastrozole  1 mg Oral Daily   aspirin  81 mg Oral Daily   influenza vaccine adjuvanted  0.5 mL Intramuscular Tomorrow-1000   rivaroxaban  20 mg Oral Q supper   sodium bicarbonate  650 mg Oral TID   sodium chloride flush  3 mL Intravenous Q12H   Continuous Infusions:  ampicillin-sulbactam (UNASYN) IV 3 g (12/06/22 1142)     LOS: 2 days    Time spent: 39 min  Alwyn Ren, MD  12/06/2022, 11:59 AM

## 2022-12-07 DIAGNOSIS — D649 Anemia, unspecified: Secondary | ICD-10-CM

## 2022-12-07 LAB — TYPE AND SCREEN
ABO/RH(D): A NEG
Antibody Screen: NEGATIVE
Unit division: 0

## 2022-12-07 LAB — BPAM RBC
Blood Product Expiration Date: 202411252359
ISSUE DATE / TIME: 202411031123
Unit Type and Rh: 600

## 2022-12-07 LAB — PARATHYROID HORMONE, INTACT (NO CA): PTH: 8 pg/mL — ABNORMAL LOW (ref 15–65)

## 2022-12-07 MED ORDER — POTASSIUM CHLORIDE CRYS ER 20 MEQ PO TBCR
40.0000 meq | EXTENDED_RELEASE_TABLET | Freq: Once | ORAL | Status: AC
  Administered 2022-12-07: 40 meq via ORAL
  Filled 2022-12-07: qty 2

## 2022-12-07 MED ORDER — SODIUM CHLORIDE 3 % IN NEBU
4.0000 mL | INHALATION_SOLUTION | Freq: Every day | RESPIRATORY_TRACT | Status: AC
  Administered 2022-12-07 – 2022-12-09 (×2): 4 mL via RESPIRATORY_TRACT
  Filled 2022-12-07 (×3): qty 4

## 2022-12-07 NOTE — Evaluation (Signed)
Occupational Therapy Evaluation Patient Details Name: Stacey [name] MRN: [id number] DOB: 03-Oct-1940 Today's Date: 12/07/2022   History of Present Illness Patient is an 82 yo female with recent admission on 11/17/22 for sepsis, and pt discharged home.  Pt admitted again on 12/04/22 for acute hypoxic respiratory failure secondary to aspiration pneumonia.  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 Patient  was living at home alone prior level with no AD. Currently, patient was noted to have O2 drop to 83% on 3L/min with transfer with increased SOB. Patient was provided with incentive spirometer with education on usage. Patient was noted to have poor functional activity tolerance, decreased standing balance, and increased need for supplemental O2 impacting participation in ADLs. Patient will benefit from continued inpatient follow up therapy, <3 hours/day       If plan is discharge home, recommend the following: A little help with walking and/or transfers;Assistance with cooking/housework;Help with stairs or ramp for entrance;Assist for transportation;A little help with bathing/dressing/bathroom    Functional Status Assessment  Patient has had a recent decline in their functional status and demonstrates the ability to make significant improvements in function in a reasonable and predictable amount of time.  Equipment Recommendations  None recommended by OT       Precautions / Restrictions Precautions Precautions: Fall Precaution Comments: monitor sats, chronic diarrhea Restrictions Weight Bearing Restrictions: No      Mobility Bed Mobility Overal bed mobility: Needs Assistance Bed Mobility: Supine to Sit     Supine to sit: Contact guard, HOB elevated, Used rails     General bed mobility comments: with increased time.          Balance Overall balance  assessment: Mild deficits observed, not formally tested           ADL either performed or assessed with clinical judgement   ADL Overall ADL's : Needs assistance/impaired Eating/Feeding: Independent;Sitting   Grooming: Set up;Sitting Grooming Details (indicate cue type and reason): O2 was noted to drop to 85% on 2L/min with transfer which was limiting progressing to sink at this time. Upper Body Bathing: Minimal assistance;Sitting   Lower Body Bathing: Sitting/lateral leans;Moderate assistance   Upper Body Dressing : Minimal assistance;Sitting   Lower Body Dressing: Moderate assistance;Sitting/lateral leans   Toilet Transfer: Minimal assistance;Stand-pivot;Rolling walker (2 wheels) Toilet Transfer Details (indicate cue type and reason): to recliner in room with increased time. Toileting- Clothing Manipulation and Hygiene: Maximal assistance;Sit to/from stand               Vision Baseline Vision/History: 1 Wears glasses                Extremity/Trunk Assessment Upper Extremity Assessment Upper Extremity Assessment: Right hand dominant;LUE deficits/detail RUE Deficits / Details: WFL LUE Deficits / Details: Chronic edema due to reported lymphedema. Chronic shoulder AROM limitations. Functional grip strength              Cognition Arousal: Alert Behavior During Therapy: WFL for tasks assessed/performed Overall Cognitive Status: Within Functional Limits for tasks assessed                    Home Living Family/patient expects to be discharged to:: Private residence Living Arrangements: Alone Available Help at Discharge: Family;Available PRN/intermittently Type of Home: House Home Access: Stairs to enter Entrance Stairs-Number of Steps: 1   Home  Layout: One level     Bathroom Shower/Tub: Chief Strategy Officer: Standard     Home Equipment: Midwife (2 wheels)          Prior Functioning/Environment Prior  Level of Function : Independent/Modified Independent;Driving             Mobility Comments: ambulating with RW, had started HHPT since recend hospital admission approx 2 weeks ago ADLs Comments: reports ADLs and IADLs were not getting done since discharge home        OT Problem List: Decreased strength;Decreased activity tolerance;Impaired balance (sitting and/or standing);Decreased knowledge of use of DME or AE;Cardiopulmonary status limiting activity      OT Treatment/Interventions: Self-care/ADL training;Therapeutic exercise;Energy conservation;DME and/or AE instruction;Therapeutic activities;Balance training;Patient/family education    OT Goals(Current goals can be found in the care plan section) Acute Rehab OT Goals Patient Stated Goal: to go home OT Goal Formulation: With patient Time For Goal Achievement: 12/21/22 Potential to Achieve Goals: Fair  OT Frequency: Min 1X/week       AM-PAC OT "6 Clicks" Daily Activity     Outcome Measure Help from another person eating meals?: None Help from another person taking care of personal grooming?: A Little Help from another person toileting, which includes using toliet, bedpan, or urinal?: A Lot Help from another person bathing (including washing, rinsing, drying)?: A Lot Help from another person to put on and taking off regular upper body clothing?: A Little Help from another person to put on and taking off regular lower body clothing?: A Lot 6 Click Score: 16   End of Session Equipment Utilized During Treatment: Rolling walker (2 wheels);Gait belt;Oxygen Nurse Communication: Mobility status  Activity Tolerance: Patient tolerated treatment well Patient left: in chair;with call bell/phone within reach  OT Visit Diagnosis: Muscle weakness (generalized) (M62.81);Unsteadiness on feet (R26.81)                Time: 1610-9604 OT Time Calculation (min): 32 min Charges:  OT General Charges $OT Visit: 1 Visit OT Evaluation $OT  Eval Moderate Complexity: 1 Mod OT Treatments $Self Care/Home Management : 8-22 mins  Rosalio Loud, MS Acute Rehabilitation Department Office# [phone]   Selinda Flavin 12/07/2022, 1:10 PM

## 2022-12-07 NOTE — Plan of Care (Signed)

## 2022-12-07 NOTE — Progress Notes (Signed)
PROGRESS NOTE    Stacey [name]  EXB:[id number] DOB: [birth year] DOA: 12/04/2022 PCP: [name], MD   Brief Narrative: 82 y.o. female with medical history significant of metastaic breast ca for which patien is on  ibrance. However no iv chemotherapy.  Patient was discharged from the [facility] health system on October 18, approximately 2 weeks ago after treatment for sepsis secondary to pneumonia that was present on admission affecting the right lower lobe.  Overall she received 7 days of antibiotics.  She initially felt better but then she began to have shortness of breath generalized weakness.    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   Assessment & Plan:   Principal Problem:   Pneumonia Active Problems:   HTN (hypertension)   Recurrent cancer of left breast (HCC)   Metabolic acidosis   Hypocalcemia   Fatigue   Immunosuppression due to chronic steroid use (HCC)   DVT (deep venous thrombosis) (HCC)   Diarrhea   Acute respiratory failure with hypoxia (HCC)  #1 acute hypoxic respiratory failure secondary to aspiration pneumonia patient admitted with shortness of breath and cough.   CT chest no pulmonary embolism.  Mild bilateral bronchial wall thickening consistent with bronchitis.  New clustered solid nodule seen in the lingula and unchanged clustered nodules and mucous plugging of the right lower lobe likely due to infection or aspiration.  Respiratory virus panel negative.  COVID negative. Flu was neg -2 weeks ago. Patient on 2 L of oxygen to maintain sats above 92%. Continue Zosyn. Add nebs IS/Flutter valve Hypertonic saline  #2 history of DVT on Xarelto  #3 chronic diarrhea secondary to Ibrance but with recent antibiotic use and multiple bowel movements with checks stool C. Difficile negative   #4 CKD with metabolic acidosis resolved  #5 stage IV recurrent left  breast cancer continue Arimidex and Ibrance  #6 history of hypertension blood pressure running soft will monitor continue IV fluids  #7 severe anxiety does not take anything at home we will start her on low-dose Xanax.   #8 multiple electrolyte abnormalities hypokalemia/hypomagnesemia/hypocalcemia will replete or this is likely from ongoing chronic diarrhea.  #9 acute on chronic anemia of chronic disease and malignancy hemoglobin dropped to 6.5 from 7.6 with no evidence of active bleeding.  This is likely due to hemodilution as she was getting IV fluids. She received 1 unit of prbc.   Estimated body mass index is 25.38 kg/m as calculated from the following:   Height as of this encounter: 5\' 3"  (1.6 m).   Weight as of this encounter: 65 kg.  DVT prophylaxis: Xarelto code Status: Full code Family Communication: None at bedside Disposition Plan:  Status is: Inpatient Remains inpatient appropriate because: Acute hypoxia   Consultants:  None  Procedures: None  Antimicrobials Unasyn  Subjective: received one unit prbc Feels better  Objective: Vitals:   12/06/22 1314 12/06/22 1452 12/06/22 2017 12/07/22 0357  BP: 125/61 (!) 117/54 133/61 (!) 107/50  Pulse: 88 96 85 79  Resp: 18 16 16 16   Temp: 98.4 F (36.9 C) 98.2 F (36.8 C) 98.6 F (37 C) 98.4 F (36.9 C)  TempSrc: Oral Oral Oral Oral  SpO2: 95% 95% 94% 95%  Weight:      Height:        Intake/Output Summary (Last 24 hours) at 12/07/2022 1052 Last data filed at 12/07/2022 0900 Gross  per 24 hour  Intake 906.24 ml  Output --  Net 906.24 ml   Filed Weights   12/05/22 1101 12/05/22 1245 12/06/22 0524  Weight: 61.2 kg 64.2 kg 65 kg    Examination:  General exam: Appears in nad respiratory system: Few rhonchi to auscultation. Respiratory effort normal. Cardiovascular system: S1 & S2 heard, RRR. No JVD, murmurs, rubs, gallops or clicks. No pedal edema. Gastrointestinal system: Abdomen is nondistended, soft and  nontender. No organomegaly or masses felt. Normal bowel sounds heard. Central nervous system: Alert and oriented.  Extremities: Right lower extremity edema more than left chronic   Data Reviewed: I have personally reviewed following labs and imaging studies  CBC: Recent Labs  Lab 12/04/22 1553 12/05/22 0500 12/06/22 0432 12/06/22 1700  WBC 3.4* 2.7* 3.6*  --   NEUTROABS 2.5  --   --   --   HGB 8.3* 7.6* 6.5* 9.3*  HCT 25.2* 22.7* 19.3* 28.0*  MCV 107.2* 104.6* 105.5*  --   PLT 185 168 147*  --    Basic Metabolic Panel: Recent Labs  Lab 12/04/22 2050 12/05/22 0500 12/05/22 1626 12/06/22 0432 12/06/22 1700  NA 143 138 140 139 145  K 2.9* 3.0* 3.5 3.0* 3.2*  CL 114* 106 104 99 101  CO2 16* 19* 24 29 28   GLUCOSE 112* 184* 170* 142* 114*  BUN 19 15 14 15 13   CREATININE 1.22* 1.23* 1.13* 1.30* 1.43*  CALCIUM 6.2* 6.7* 6.5* 6.0* 6.6*  MG <0.5* 0.9* 1.9 1.7  --   PHOS 3.9  --   --   --   --    GFR: Estimated Creatinine Clearance: 27.5 mL/min (A) (by C-G formula based on SCr of 1.43 mg/dL (H)). Liver Function Tests: Recent Labs  Lab 12/04/22 1553 12/04/22 2050 12/05/22 1626 12/06/22 0432 12/06/22 1700  AST 22  --  18 21 22   ALT 14  --  11 11 13   ALKPHOS 46  --  45 39 42  BILITOT 0.8  --  0.3 0.6 0.6  PROT 6.7  --  5.9* 5.3* 5.7*  ALBUMIN 3.0* 2.9* 2.7* 2.4* 2.5*   No results for input(s): "LIPASE", "AMYLASE" in the last 168 hours. No results for input(s): "AMMONIA" in the last 168 hours. Coagulation Profile: Recent Labs  Lab 12/05/22 0500  INR 3.2*   Cardiac Enzymes: No results for input(s): "CKTOTAL", "CKMB", "CKMBINDEX", "TROPONINI" in the last 168 hours. BNP (last 3 results) No results for input(s): "PROBNP" in the last 8760 hours. HbA1C: No results for input(s): "HGBA1C" in the last 72 hours. CBG: No results for input(s): "GLUCAP" in the last 168 hours. Lipid Profile: No results for input(s): "CHOL", "HDL", "LDLCALC", "TRIG", "CHOLHDL", "LDLDIRECT"  in the last 72 hours. Thyroid Function Tests: Recent Labs    12/05/22 0500  TSH 0.784  FREET4 1.33*   Anemia Panel: Recent Labs    12/06/22 0714  VITAMINB12 702  FOLATE 12.9  FERRITIN 39  TIBC 221*  IRON 45  RETICCTPCT 1.5   Sepsis Labs: Recent Labs  Lab 12/04/22 1609 12/04/22 1745 12/04/22 2050  PROCALCITON  --   --  <0.10  LATICACIDVEN 1.1 0.6  --     Recent Results (from the past 240 hour(s))  Culture, blood (routine x 2)     Status: None (Preliminary result)   Collection Time: 12/04/22  3:42 PM   Specimen: BLOOD  Result Value Ref Range Status   Specimen Description   Final    BLOOD RIGHT  ANTECUBITAL Performed at [facility], [address] [place], [place] [address]    Special Requests   Final    BOTTLES DRAWN AEROBIC AND ANAEROBIC Blood Culture adequate volume Performed at [facility], [address], [place], [place] [address]    Culture   Final    NO GROWTH 2 DAYS Performed at [facility], [address] [place], [place] [address]    Report Status PENDING  Incomplete  Culture, blood (routine x 2)     Status: None (Preliminary result)   Collection Time: 12/04/22  4:39 PM   Specimen: BLOOD RIGHT HAND  Result Value Ref Range Status   Specimen Description   Final    BLOOD RIGHT HAND Performed at [facility], [address], [place], [place] [address]    Special Requests   Final    BOTTLES DRAWN AEROBIC ONLY Blood Culture results may not be optimal due to an inadequate volume of blood received in culture bottles Performed at [facility], [address] [place], [place] [address]    Culture   Final    NO GROWTH 2 DAYS Performed at [facility], [address] [place], [place] [address]    Report Status PENDING  Incomplete  SARS Coronavirus 2 by RT PCR (hospital order, performed in [facility] hospital lab) *cepheid single result test* Anterior Nasal Swab     Status:  None   Collection Time: 12/04/22  9:37 PM   Specimen: Anterior Nasal Swab  Result Value Ref Range Status   SARS Coronavirus 2 by RT PCR NEGATIVE NEGATIVE Final    Comment: (NOTE) SARS-CoV-2 target nucleic acids are NOT DETECTED.  The SARS-CoV-2 RNA is generally detectable in upper and lower respiratory specimens during the acute phase of infection. The lowest concentration of SARS-CoV-2 viral copies this assay can detect is 250 copies / mL. A negative result does not preclude SARS-CoV-2 infection and should not be used as the sole basis for treatment or other patient management decisions.  A negative result may occur with improper specimen collection / handling, submission of specimen other than nasopharyngeal swab, presence of viral mutation(s) within the areas targeted by this assay, and inadequate number of viral copies (<250 copies / mL). A negative result must be combined with clinical observations, patient history, and epidemiological information.  Fact Sheet for Patients:   RoadLapTop.co.za  Fact Sheet for Healthcare Providers: http://kim-miller.com/  This test is not yet approved or  cleared by the [place] FDA and has been authorized for detection and/or diagnosis of SARS-CoV-2 by FDA under an Emergency Use Authorization (EUA).  This EUA will remain in effect (meaning this test can be used) for the duration of the COVID-19 declaration under Section 564(b)(1) of the Act, 21 U.S.C. section 360bbb-3(b)(1), unless the authorization is terminated or revoked sooner.  Performed at [facility], [address] [place], [place] [address]   Respiratory (~20 pathogens) panel by PCR     Status: None   Collection Time: 12/04/22  9:37 PM   Specimen: Anterior Nasal Swab; Respiratory  Result Value Ref Range Status   Adenovirus NOT DETECTED NOT DETECTED Final   Coronavirus 229E NOT DETECTED NOT DETECTED Final     Comment: (NOTE) The Coronavirus on the Respiratory Panel, DOES NOT test for the novel  Coronavirus (2019 nCoV)    Coronavirus HKU1 NOT DETECTED NOT DETECTED Final   Coronavirus NL63 NOT DETECTED NOT DETECTED  Final   Coronavirus OC43 NOT DETECTED NOT DETECTED Final   Metapneumovirus NOT DETECTED NOT DETECTED Final   Rhinovirus / Enterovirus NOT DETECTED NOT DETECTED Final   Influenza A NOT DETECTED NOT DETECTED Final   Influenza B NOT DETECTED NOT DETECTED Final   Parainfluenza Virus 1 NOT DETECTED NOT DETECTED Final   Parainfluenza Virus 2 NOT DETECTED NOT DETECTED Final   Parainfluenza Virus 3 NOT DETECTED NOT DETECTED Final   Parainfluenza Virus 4 NOT DETECTED NOT DETECTED Final   Respiratory Syncytial Virus NOT DETECTED NOT DETECTED Final   Bordetella pertussis NOT DETECTED NOT DETECTED Final   Bordetella Parapertussis NOT DETECTED NOT DETECTED Final   Chlamydophila pneumoniae NOT DETECTED NOT DETECTED Final   Mycoplasma pneumoniae NOT DETECTED NOT DETECTED Final    Comment: Performed at [facility], [address] [place], [place] [address]  C Difficile Quick Screen w PCR reflex     Status: None   Collection Time: 12/05/22 10:17 PM  Result Value Ref Range Status   C Diff antigen NEGATIVE NEGATIVE Final   C Diff toxin NEGATIVE NEGATIVE Final   C Diff interpretation No C. difficile detected.  Final    Comment: Performed at [facility], [address], [place], [place] [address]         Radiology Studies: ECHOCARDIOGRAM COMPLETE  Result Date: 12/05/2022    ECHOCARDIOGRAM REPORT   Patient Name:   Stacey [name] Date of Exam: 12/05/2022 Medical Rec #:  [id number]    Height:       63.0 in Accession #:    [id number]   Weight:       138.0 lb Date of Birth:  Feb 21, 1940    BSA:          1.652 m Patient Age:    82 years     BP:           108/56 mmHg Patient Gender: F            HR:           90 bpm. Exam Location:  Inpatient Procedure: 2D Echo, Cardiac Doppler  and Color Doppler Indications:    CHF Acute Systolic I50.21  History:        Patient has prior history of Echocardiogram examinations, most                 recent 05/25/2017. COPD; Risk Factors:Dyslipidemia.  Sonographer:    Harriette Bouillon RDCS Referring Phys: [id number] [facility] GOEL IMPRESSIONS  1. Left ventricular ejection fraction, by estimation, is 60 to 65%. The left ventricle has normal function. The left ventricle has no regional wall motion abnormalities. There is mild concentric left ventricular hypertrophy. Left ventricular diastolic parameters are consistent with Grade I diastolic dysfunction (impaired relaxation). Elevated left atrial pressure.  2. Right ventricular systolic function is normal. The right ventricular size is normal.  3. The mitral valve is normal in structure. No evidence of mitral valve regurgitation. No evidence of mitral stenosis.  4. The aortic valve is tricuspid. Aortic valve regurgitation is not visualized. No aortic stenosis is present. FINDINGS  Left Ventricle: Left ventricular ejection fraction, by estimation, is 60 to 65%. The left ventricle has normal function. The left ventricle has no regional wall motion abnormalities. The left ventricular internal cavity size was normal in size. There is  mild concentric left ventricular hypertrophy. Left ventricular diastolic parameters are consistent with Grade I diastolic dysfunction (impaired relaxation). Elevated left atrial pressure. Right  Ventricle: The right ventricular size is normal. Right ventricular systolic function is normal. Left Atrium: Left atrial size was normal in size. Right Atrium: Right atrial size was normal in size. Pericardium: There is no evidence of pericardial effusion. Mitral Valve: The mitral valve is normal in structure. Mild mitral annular calcification. No evidence of mitral valve regurgitation. No evidence of mitral valve stenosis. Tricuspid Valve: The tricuspid valve is normal in structure. Tricuspid valve  regurgitation is mild . No evidence of tricuspid stenosis. Aortic Valve: The aortic valve is tricuspid. Aortic valve regurgitation is not visualized. No aortic stenosis is present. Pulmonic Valve: The pulmonic valve was normal in structure. Pulmonic valve regurgitation is trivial. No evidence of pulmonic stenosis. Aorta: The aortic root is normal in size and structure. Venous: The inferior vena cava was not well visualized. IAS/Shunts: The interatrial septum was not well visualized.  LEFT VENTRICLE PLAX 2D LVIDd:         3.30 cm   Diastology LVIDs:         2.10 cm   LV e' medial:    5.44 cm/s LV PW:         1.20 cm   LV E/e' medial:  17.5 LV IVS:        1.20 cm   LV e' lateral:   7.18 cm/s LVOT diam:     2.00 cm   LV E/e' lateral: 13.3 LV SV:         63 LV SV Index:   38 LVOT Area:     3.14 cm  RIGHT VENTRICLE TAPSE (M-mode): 1.5 cm LEFT ATRIUM             Index LA diam:        2.10 cm 1.27 cm/m LA Vol (A2C):   39.2 ml 23.74 ml/m LA Vol (A4C):   49.1 ml 29.73 ml/m LA Biplane Vol: 46.1 ml 27.91 ml/m  AORTIC VALVE LVOT Vmax:   99.20 cm/s LVOT Vmean:  73.400 cm/s LVOT VTI:    0.202 m  AORTA Ao Root diam: 3.00 cm Ao Asc diam:  3.30 cm MITRAL VALVE               TRICUSPID VALVE MV Area (PHT): 3.99 cm    TR Peak grad:   37.5 mmHg MV Decel Time: 190 msec    TR Vmax:        306.00 cm/s MV E velocity: 95.20 cm/s MV A velocity: 99.50 cm/s  SHUNTS MV E/A ratio:  0.96        Systemic VTI:  0.20 m                            Systemic Diam: 2.00 cm Olga Millers MD Electronically signed by Olga Millers MD Signature Date/Time: 12/05/2022/2:27:54 PM    Final         Scheduled Meds:  anastrozole  1 mg Oral Daily   aspirin  81 mg Oral Daily   influenza vaccine adjuvanted  0.5 mL Intramuscular Tomorrow-1000   loperamide  4 mg Oral TID   rivaroxaban  20 mg Oral Q supper   sodium bicarbonate  650 mg Oral TID   sodium chloride flush  3 mL Intravenous Q12H   Continuous Infusions:  ampicillin-sulbactam (UNASYN) IV 3  g (12/07/22 0552)     LOS: 3 days    Time spent: 39 min  Alwyn Ren, MD  12/07/2022, 10:52 AM

## 2022-12-07 NOTE — [place] FL2 (Signed)
[place] MEDICAID FL2 LEVEL OF CARE FORM     IDENTIFICATION  Patient Name: Stacey [name] Birthdate: 25-Aug-1940 Sex: female Admission Date (Current Location): 12/04/2022  [facility] and [place] Number:  Producer, television/film/video and Address:  [facility],  [address] [place], [place] [address]      Provider Number: [id number]  Attending Physician Name and Address:  Alwyn Ren, MD  Relative Name and Phone Number:  Smith,[name] (Daughter)  [phone] (Mobile)    Current Level of Care: Hospital Recommended Level of Care: Skilled Nursing Facility Prior Approval Number:    Date Approved/Denied:   PASRR Number: [id number]  Discharge Plan: SNF    Current Diagnoses: Patient Active Problem List   Diagnosis Date Noted   Acute respiratory failure with hypoxia (HCC) 12/05/2022   Metabolic acidosis 12/04/2022   Hypocalcemia 12/04/2022   Fatigue 12/04/2022   Pneumonia 12/04/2022   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   DVT (deep venous thrombosis) (HCC) 12/04/2022   Diarrhea 12/04/2022   Sepsis (HCC) 11/17/2022   History of DVT (deep vein thrombosis) 11/17/2022   Current chronic use of systemic steroids 11/17/2022   Tachypnea 11/17/2022   Pulmonary nodule 11/17/2022   Acute deep vein thrombosis (DVT) of femoral vein of right lower extremity (HCC) 04/13/2022   Acute upper respiratory infection 05/16/2021   Nasal dryness 02/04/2021   Chronic sinusitis 11/25/2020   Allergic rhinitis 07/08/2020   Hypomagnesemia 06/10/2020   Goals of care, counseling/discussion 06/21/2019   Family history of breast cancer    Family history of pancreatic cancer    Family history of uterine cancer    Family history of lymphoma    Family history of lung cancer    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HCC) 06/02/2019   Recurrent cancer of left breast (HCC) 05/15/2019   Pes anserine bursitis 03/31/2019   HTN (hypertension) 02/10/2019   Hyperglycemia  06/21/2018   Left carpal tunnel syndrome 02/22/2018   Rotator cuff arthropathy of left shoulder 09/20/2017   Arthritis of hand 08/23/2017   Pain in right hand 08/23/2017   Cervical radiculitis 04/09/2017   Left shoulder pain 04/09/2017   Dyspnea on exertion 05/14/2016   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   Lymphedema of left arm 01/14/2016   Left arm swelling 12/25/2015   SVT (supraventricular tachycardia) (HCC)    Left-sided carotid artery disease (HCC) 03/08/2015   Dizziness 01/24/2015   Urinary frequency 01/13/2015   Dizziness and giddiness 01/09/2015   Gallstones 02/21/2013   Malignant neoplasm of upper-inner quadrant of left breast in female, estrogen receptor positive (HCC) 11/28/2012   Muscle cramping 09/12/2012   Right hip pain 09/12/2012   Lower back pain 09/12/2012   COPD GOLD I     Hx of radiation therapy    Right shoulder pain 09/14/2011   Preventative health care 07/29/2010   Skin lesion of left leg 07/29/2010   Paresthesia 07/29/2010   GERD 03/28/2010   Constipation 03/28/2010   Osteoarthrosis, 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   Anxiety state 05/03/2009   Hyperlipidemia 06/14/2007   FATIGUE 06/14/2007   Anemia 12/17/2006   Diverticulosis of colon 12/17/2006   Cough 12/17/2006   IRRITABLE BOWEL SYNDROME, HX OF 12/17/2006    Orientation RESPIRATION BLADDER Height & Weight     Self, Time, Situation, Place  O2 (2L) Continent Weight: 143 lb 4.8 oz (65 kg) Height:  5\' 3"  (160 cm)  BEHAVIORAL SYMPTOMS/MOOD NEUROLOGICAL BOWEL NUTRITION STATUS  Continent    AMBULATORY STATUS COMMUNICATION OF NEEDS Skin   Limited Assist Verbally Normal                       Personal Care Assistance Level of Assistance  Bathing, Feeding, Dressing Bathing Assistance: Limited assistance Feeding assistance: Independent Dressing Assistance: Limited assistance     Functional Limitations Info  Sight, Hearing, Speech Sight Info: Impaired (glasses) Hearing  Info: Adequate Speech Info: Adequate    SPECIAL CARE FACTORS FREQUENCY  PT (By licensed PT), OT (By licensed OT)     PT Frequency: 5 x a week OT Frequency: 5 x a week            Contractures Contractures Info: Not present    Additional Factors Info  Code Status, Allergies Code Status Info: full Allergies Info: Tape  Aminoglycosides  Bacitracin  Bee Venom  Cephalexin  Clindamycin/lincomycin  Codeine           Current Medications (12/07/2022):  This is the current hospital active medication list Current Facility-Administered Medications  Medication Dose Route Frequency Provider Last Rate Last Admin   acetaminophen (TYLENOL) tablet 650 mg  650 mg Oral Q6H PRN Nolberto Hanlon, MD       Or   acetaminophen (TYLENOL) suppository 650 mg  650 mg Rectal Q6H PRN Nolberto Hanlon, MD       albuterol (PROVENTIL) (2.5 MG/3ML) 0.083% nebulizer solution 2.5 mg  2.5 mg Nebulization Q4H PRN Alwyn Ren, MD       ALPRAZolam Prudy Feeler) tablet 0.25 mg  0.25 mg Oral TID PRN Alwyn Ren, MD   0.25 mg at 12/07/22 0015   Ampicillin-Sulbactam (UNASYN) 3 g in sodium chloride 0.9 % 100 mL IVPB  3 g Intravenous Q12H Ellington, Abby K, RPH 200 mL/hr at 12/07/22 0552 3 g at 12/07/22 0552   anastrozole (ARIMIDEX) tablet 1 mg  1 mg Oral Daily Nolberto Hanlon, MD   1 mg at 12/07/22 1610   aspirin chewable tablet 81 mg  81 mg Oral Daily Nolberto Hanlon, MD   81 mg at 12/07/22 0910   influenza vaccine adjuvanted (FLUAD) injection 0.5 mL  0.5 mL Intramuscular Tomorrow-1000 Alwyn Ren, MD       loperamide (IMODIUM) capsule 2 mg  2 mg Oral Q6H PRN Alwyn Ren, MD   2 mg at 12/06/22 1537   loperamide (IMODIUM) capsule 4 mg  4 mg Oral TID Pricilla Riffle, RPH   4 mg at 12/07/22 0910   polyethylene glycol (MIRALAX / GLYCOLAX) packet 17 g  17 g Oral Daily PRN Nolberto Hanlon, MD       rivaroxaban (XARELTO) tablet 20 mg  20 mg Oral Q supper Pham, Anh P, RPH   20 mg at 12/06/22 1646   sodium bicarbonate  tablet 650 mg  650 mg Oral TID Nolberto Hanlon, MD   650 mg at 12/07/22 0910   sodium chloride flush (NS) 0.9 % injection 3 mL  3 mL Intravenous Q12H Nolberto Hanlon, MD   3 mL at 12/07/22 0910   sodium chloride HYPERTONIC 3 % nebulizer solution 4 mL  4 mL Nebulization Daily Alwyn Ren, MD       zolpidem Remus Loffler) tablet 5 mg  5 mg Oral QHS PRN Alwyn Ren, MD   5 mg at 12/07/22 0015     Discharge Medications: Please see discharge summary for a list of discharge medications.  Relevant Imaging Results:  Relevant Lab Results:  Additional Information [id number]  Valentina Shaggy Shakeya Kerkman, LCSW

## 2022-12-07 NOTE — Progress Notes (Signed)
Mobility Specialist - Progress Note   12/07/22 1338  Mobility  Activity Transferred from chair to bed  Level of Assistance Contact guard assist, steadying assist  Assistive Device None  Distance Ambulated (ft) 2 ft  Range of Motion/Exercises Active Assistive  Activity Response Tolerated fair  $Mobility charge 1 Mobility  Mobility Specialist Start Time (ACUTE ONLY) 1330  Mobility Specialist Stop Time (ACUTE ONLY) 1338  Mobility Specialist Time Calculation (min) (ACUTE ONLY) 8 min   Pt was found on recliner chair wanting to transfer back to bed. Pt was CG for transfer a little unsteady. Able to follow cues for transfer. Was left in bed with all needs met. Call bell in reach and bed alarm on.  Billey Chang Mobility Specialist

## 2022-12-07 NOTE — Plan of Care (Signed)

## 2022-12-07 NOTE — TOC Initial Note (Signed)
Transition of Care [facility]) - Initial/Assessment Note    Patient Details  Name: Stacey [name] MRN: [id number] Date of Birth: 01-17-1941  [facility]) CM/SW Contact:    Larrie Kass, LCSW Phone Number: 12/07/2022, 11:30 AM  Clinical Narrative:                 CSW met with pt, she reports she lives alone and needs more assistance at home. CSW discussed recommendations for SNF placement. Pt has agreed and would like [facility]. CSW explained the process. Pt has agreed for information to be sent out to [facility] If, [facility] has no bed. TOC to follow   Expected Discharge Plan: Skilled Nursing Facility Barriers to Discharge: Continued Medical Work up   Patient Goals and CMS Choice Patient states their goals for this hospitalization and ongoing recovery are:: SNF to get stronger CMS Medicare.gov Compare Post Acute Care list provided to:: Patient Choice offered to / list presented to : Patient      Expected Discharge Plan and Services       Living arrangements for the past 2 months: Single Family Home                                      Prior Living Arrangements/Services Living arrangements for the past 2 months: Single Family Home Lives with:: Self Patient language and need for interpreter reviewed:: Yes Do you feel safe going back to the place where you live?: Yes      Need for Family Participation in Patient Care: No (Comment) Care giver support system in place?: No (comment) Current home services: DME Criminal Activity/Legal Involvement Pertinent to Current Situation/Hospitalization: No - Comment as needed  Activities of Daily Living   ADL Screening (condition at time of admission) Independently performs ADLs?: No Does the patient have a NEW difficulty with bathing/dressing/toileting/self-feeding that is expected to last >3 days?: Yes (Initiates electronic notice to provider for possible OT consult) Does the patient have a NEW  difficulty with getting in/out of bed, walking, or climbing stairs that is expected to last >3 days?: Yes (Initiates electronic notice to provider for possible PT consult) Does the patient have a NEW difficulty with communication that is expected to last >3 days?: No Is the patient deaf or have difficulty hearing?: No Does the patient have difficulty seeing, even when wearing glasses/contacts?: No Does the patient have difficulty concentrating, remembering, or making decisions?: No  Permission Sought/Granted                  Emotional Assessment Appearance:: Appears stated age Attitude/Demeanor/Rapport: Gracious, Engaged Affect (typically observed): Accepting Orientation: : Oriented to Self, Oriented to Place, Oriented to  Time, Oriented to Situation      Admission diagnosis:  Hypocalcemia [E83.51] Pneumonia [J18.9] Acute respiratory failure with hypoxia (HCC) [J96.01] Nausea vomiting and diarrhea [R11.2, R19.7] Metastasis from breast cancer (HCC) [C79.9, C50.919] Anemia, unspecified type [D64.9] Aspiration pneumonia of right lower lobe, unspecified aspiration pneumonia type [facility]) [J69.0] Patient Active Problem List   Diagnosis Date Noted   Acute respiratory failure with hypoxia (HCC) 12/05/2022   Metabolic acidosis 12/04/2022   Hypocalcemia 12/04/2022   Fatigue 12/04/2022   Pneumonia 12/04/2022   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   DVT (deep venous thrombosis) (HCC) 12/04/2022   Diarrhea 12/04/2022   Sepsis (HCC) 11/17/2022   History of DVT (deep vein  thrombosis) 11/17/2022   Current chronic use of systemic steroids 11/17/2022   Tachypnea 11/17/2022   Pulmonary nodule 11/17/2022   Acute deep vein thrombosis (DVT) of femoral vein of right lower extremity (HCC) 04/13/2022   Acute upper respiratory infection 05/16/2021   Nasal dryness 02/04/2021   Chronic sinusitis 11/25/2020   Allergic rhinitis 07/08/2020   Hypomagnesemia 06/10/2020   Goals  of care, counseling/discussion 06/21/2019   Family history of breast cancer    Family history of pancreatic cancer    Family history of uterine cancer    Family history of lymphoma    Family history of lung cancer    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HCC) 06/02/2019   Recurrent cancer of left breast (HCC) 05/15/2019   Pes anserine bursitis 03/31/2019   HTN (hypertension) 02/10/2019   Hyperglycemia 06/21/2018   Left carpal tunnel syndrome 02/22/2018   Rotator cuff arthropathy of left shoulder 09/20/2017   Arthritis of hand 08/23/2017   Pain in right hand 08/23/2017   Cervical radiculitis 04/09/2017   Left shoulder pain 04/09/2017   Dyspnea on exertion 05/14/2016   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   Lymphedema of left arm 01/14/2016   Left arm swelling 12/25/2015   SVT (supraventricular tachycardia) (HCC)    Left-sided carotid artery disease (HCC) 03/08/2015   Dizziness 01/24/2015   Urinary frequency 01/13/2015   Dizziness and giddiness 01/09/2015   Gallstones 02/21/2013   Malignant neoplasm of upper-inner quadrant of left breast in female, estrogen receptor positive (HCC) 11/28/2012   Muscle cramping 09/12/2012   Right hip pain 09/12/2012   Lower back pain 09/12/2012   COPD GOLD I     Hx of radiation therapy    Right shoulder pain 09/14/2011   Preventative health care 07/29/2010   Skin lesion of left leg 07/29/2010   Paresthesia 07/29/2010   GERD 03/28/2010   Constipation 03/28/2010   Osteoarthrosis, 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   Anxiety state 05/03/2009   Hyperlipidemia 06/14/2007   FATIGUE 06/14/2007   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 [place] [address] Phone: [phone] Fax: [phone]     Social Determinants of Health (SDOH) Social History: SDOH Screenings   Food Insecurity: No Food Insecurity (12/05/2022)  Housing: Low Risk  (12/06/2022)  Transportation Needs: No Transportation Needs (12/06/2022)  Utilities: Not At Risk (12/06/2022)  Alcohol Screen: Low Risk  (11/07/2022)  Depression (PHQ2-9): Low Risk  (11/10/2022)  Financial Resource Strain: Low Risk  (11/07/2022)  Physical Activity: Inactive (11/07/2022)  Social Connections: Socially Isolated (11/07/2022)  Stress: No Stress Concern Present (11/07/2022)  Tobacco Use: Medium Risk (12/04/2022)  Health Literacy: Adequate Health Literacy (11/10/2022)   SDOH Interventions:     Readmission Risk Interventions    11/19/2022    1:54 PM  Readmission Risk Prevention Plan  Transportation Screening Complete  PCP or Specialist Appt within 5-7 Days Complete  Home Care Screening Complete  Medication Review (RN CM) Complete

## 2022-12-07 NOTE — Progress Notes (Signed)
Prior-To-Admission Oral Chemotherapy for Treatment of Oncologic Disease   Order noted from Dr. Margo Aye to continue prior-to-admission oral chemotherapy regimen of Ibrance.  Procedure Per Pharmacy & Therapeutics Committee Policy: Orders for continuation of home oral chemotherapy for treatment of an oncologic disease will be held unless approved by an oncologist during current admission.    For patients receiving oncology care at [facility], inpatient pharmacist contacts patient's oncologist during regular office hours to review. If earlier review is medically necessary, attending physician consults [name] on-call oncologist   Oral chemotherapy continuation order is on hold pending oncologist review,  Status: Per prescriber [name]), hold Ibrance while admitted d/t active infection      [name] A 12/07/2022, 2:17 PM

## 2022-12-08 ENCOUNTER — Inpatient Hospital Stay: Payer: Medicare Other | Admitting: Internal Medicine

## 2022-12-08 ENCOUNTER — Inpatient Hospital Stay (HOSPITAL_COMMUNITY): Payer: Medicare Other

## 2022-12-08 DIAGNOSIS — J9601 Acute respiratory failure with hypoxia: Secondary | ICD-10-CM | POA: Diagnosis not present

## 2022-12-08 LAB — COMPREHENSIVE METABOLIC PANEL
ALT: 13 U/L (ref 0–44)
AST: 21 U/L (ref 15–41)
Albumin: 2.7 g/dL — ABNORMAL LOW (ref 3.5–5.0)
Alkaline Phosphatase: 50 U/L (ref 38–126)
Anion gap: 13 (ref 5–15)
BUN: 15 mg/dL (ref 8–23)
CO2: 30 mmol/L (ref 22–32)
Calcium: 6.9 mg/dL — ABNORMAL LOW (ref 8.9–10.3)
Chloride: 100 mmol/L (ref 98–111)
Creatinine, Ser: 1.68 mg/dL — ABNORMAL HIGH (ref 0.44–1.00)
GFR, Estimated: 30 mL/min — ABNORMAL LOW (ref >60)
Glucose, Bld: 142 mg/dL — ABNORMAL HIGH (ref 70–99)
Potassium: 3.8 mmol/L (ref 3.5–5.1)
Sodium: 143 mmol/L (ref 135–145)
Total Bilirubin: 0.7 mg/dL (ref <1.2)
Total Protein: 6.2 g/dL — ABNORMAL LOW (ref 6.5–8.1)

## 2022-12-08 LAB — MAGNESIUM: Magnesium: 1.2 mg/dL — ABNORMAL LOW (ref 1.7–2.4)

## 2022-12-08 LAB — CBC
HCT: 31 % — ABNORMAL LOW (ref 36.0–46.0)
Hemoglobin: 10.2 g/dL — ABNORMAL LOW (ref 12.0–15.0)
MCH: 34.3 pg — ABNORMAL HIGH (ref 26.0–34.0)
MCHC: 32.9 g/dL (ref 30.0–36.0)
MCV: 104.4 fL — ABNORMAL HIGH (ref 80.0–100.0)
Platelets: 133 10*3/uL — ABNORMAL LOW (ref 150–400)
RBC: 2.97 MIL/uL — ABNORMAL LOW (ref 3.87–5.11)
RDW: 18.6 % — ABNORMAL HIGH (ref 11.5–15.5)
WBC: 3.1 10*3/uL — ABNORMAL LOW (ref 4.0–10.5)
nRBC: 0 % (ref 0.0–0.2)

## 2022-12-08 MED ORDER — CALCIUM GLUCONATE-NACL 1-0.675 GM/50ML-% IV SOLN
1.0000 g | Freq: Once | INTRAVENOUS | Status: AC
  Administered 2022-12-08: 1000 mg via INTRAVENOUS
  Filled 2022-12-08: qty 50

## 2022-12-08 MED ORDER — ALPRAZOLAM 0.25 MG PO TABS
0.2500 mg | ORAL_TABLET | Freq: Once | ORAL | Status: AC
  Administered 2022-12-08: 0.25 mg via ORAL
  Filled 2022-12-08: qty 1

## 2022-12-08 MED ORDER — CEFEPIME HCL 2 G IV SOLR
2.0000 g | Freq: Once | INTRAVENOUS | Status: AC
  Administered 2022-12-08: 2 g via INTRAVENOUS
  Filled 2022-12-08: qty 12.5

## 2022-12-08 MED ORDER — MAGNESIUM SULFATE 2 GM/50ML IV SOLN
2.0000 g | Freq: Once | INTRAVENOUS | Status: AC
  Administered 2022-12-08: 2 g via INTRAVENOUS
  Filled 2022-12-08: qty 50

## 2022-12-08 MED ORDER — MIDODRINE HCL 5 MG PO TABS
2.5000 mg | ORAL_TABLET | Freq: Three times a day (TID) | ORAL | Status: DC
  Administered 2022-12-08 – 2022-12-10 (×6): 2.5 mg via ORAL
  Filled 2022-12-08 (×6): qty 1

## 2022-12-08 MED ORDER — ALPRAZOLAM 0.25 MG PO TABS: 0.2500 mg | ORAL_TABLET | Freq: Every evening | ORAL | Status: DC | PRN

## 2022-12-08 MED ORDER — HYDROXYZINE HCL 10 MG PO TABS
10.0000 mg | ORAL_TABLET | Freq: Three times a day (TID) | ORAL | Status: DC
  Administered 2022-12-08 – 2022-12-16 (×24): 10 mg via ORAL
  Filled 2022-12-08 (×24): qty 1

## 2022-12-08 NOTE — Plan of Care (Signed)

## 2022-12-08 NOTE — Progress Notes (Addendum)
PROGRESS NOTE    Stacey [name]  NWG:[id number] DOB: 03-Sep-1940 DOA: 12/04/2022 PCP: [name], MD   Brief Narrative: 82 y.o. female with medical history significant of metastaic breast ca for which patient is on  ibrance. Patient was discharged from the [facility] health system on October 18, approximately 2 weeks ago after treatment for sepsis secondary to pneumonia that was present on admission affecting the right lower lobe.  Overall she received 7 days of antibiotics.  She initially felt better but then she began to have shortness of breath generalized weakness.    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   Assessment & Plan:   Principal Problem:   Pneumonia Active Problems:   Anemia   HTN (hypertension)   Recurrent cancer of left breast (HCC)   Metabolic acidosis   Hypocalcemia   Fatigue   Immunosuppression due to chronic steroid use (HCC)   DVT (deep venous thrombosis) (HCC)   Diarrhea   Acute respiratory failure with hypoxia (HCC)  #1 acute hypoxic respiratory failure secondary to HCAP- patient admitted with shortness of breath and cough.  She was started on Unasyn thinking this could be an aspiration pneumonia however she passed a speech evaluation without any evidence of aspiration. She finished a course of IV antibiotics 5 days duration on 12/08/2022. Repeat chest x-ray for 12/08/2022 pending.  CT chest no pulmonary embolism.  Mild bilateral bronchial wall thickening consistent with bronchitis.  New clustered solid nodule seen in the lingula and unchanged clustered nodules and mucous plugging of the right lower lobe likely due to infection or aspiration.  Respiratory virus panel negative.  COVID negative. Flu was neg -2 weeks ago. Patient on 2 L of oxygen to maintain sats above 92%. 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 Difficile checked negative   #4 AKI on CKD stage IIIb-unclear why her creatinine is bumping up in spite of IV fluids.  Creatinine 1.68 from 1.23 on admission.  ?  Possible soft blood pressure is contributing? Check bladder scan and renal ultrasound.  #5 stage IV recurrent left breast cancer continue Arimidex.Ibrance on hold due to acute infection.dw dr Al Pimple.  #6 history of hypertension -not on any antihypertensives prior to admission however her blood pressure has been running soft and she has been on NS at a low rate.   Will stop IV fluids and start midodrine   #7 severe anxiety does not take anything at home she was darted on Xanax as needed with Atarax.   #8 multiple electrolyte abnormalities hypokalemia/hypomagnesemia/hypocalcemia will replete or this is likely from ongoing chronic diarrhea.  Hypokalemia resolved.  Magnesium 1.2 will replete.  Calcium 6.9 will replete.  #9 acute on chronic anemia of chronic disease and malignancy hemoglobin dropped to 6.5 from 7.6 with no evidence of active bleeding.  This is likely due to hemodilution as she was getting IV fluids. She received 1 unit of prbc. Hemoglobin stable at 10.2.  Estimated body mass index is 25.38 kg/m as calculated from the following:   Height as of this encounter: 5\' 3"  (1.6 m).   Weight as of this encounter: 65 kg.  DVT prophylaxis: Xarelto code Status: Full code Family Communication: None at bedside Disposition Plan:  Status is: Inpatient Remains  inpatient appropriate because: Acute hypoxia   Consultants:  None  Procedures: None  Antimicrobials cefepime  Subjective: She is extremely anxious Breathing is better Still with on and off diarrhea which is chronic  Objective: Vitals:   12/07/22 1314 12/07/22 1349 12/07/22 2039 12/08/22 0440  BP:  127/60 138/61 (!) 102/45  Pulse:  78 95 77   Resp:  20 16 16   Temp:  98.3 F (36.8 C) 98.2 F (36.8 C) 98.8 F (37.1 C)  TempSrc:  Oral Oral   SpO2: 97% 98% 99% 96%  Weight:      Height:        Intake/Output Summary (Last 24 hours) at 12/08/2022 1320 Last data filed at 12/08/2022 0900 Gross per 24 hour  Intake 233.82 ml  Output 250 ml  Net -16.18 ml   Filed Weights   12/05/22 1101 12/05/22 1245 12/06/22 0524  Weight: 61.2 kg 64.2 kg 65 kg    Examination:  General exam: Appears in nad respiratory system: Few rhonchi to auscultation. Respiratory effort normal. Cardiovascular system: S1 & S2 heard, RRR. No JVD, murmurs, rubs, gallops or clicks. No pedal edema. Gastrointestinal system: Abdomen is nondistended, soft and nontender. No organomegaly or masses felt. Normal bowel sounds heard. Central nervous system: Alert and oriented.  Extremities: Right lower extremity edema more than left chronic   Data Reviewed: I have personally reviewed following labs and imaging studies  CBC: Recent Labs  Lab 12/04/22 1553 12/05/22 0500 12/06/22 0432 12/06/22 1700 12/08/22 0921  WBC 3.4* 2.7* 3.6*  --  3.1*  NEUTROABS 2.5  --   --   --   --   HGB 8.3* 7.6* 6.5* 9.3* 10.2*  HCT 25.2* 22.7* 19.3* 28.0* 31.0*  MCV 107.2* 104.6* 105.5*  --  104.4*  PLT 185 168 147*  --  133*   Basic Metabolic Panel: Recent Labs  Lab 12/04/22 2050 12/05/22 0500 12/05/22 1626 12/06/22 0432 12/06/22 1700 12/08/22 0921  NA 143 138 140 139 145 143  K 2.9* 3.0* 3.5 3.0* 3.2* 3.8  CL 114* 106 104 99 101 100  CO2 16* 19* 24 29 28 30   GLUCOSE 112* 184* 170* 142* 114* 142*  BUN 19 15 14 15 13 15   CREATININE 1.22* 1.23* 1.13* 1.30* 1.43* 1.68*  CALCIUM 6.2* 6.7* 6.5* 6.0* 6.6* 6.9*  MG <0.5* 0.9* 1.9 1.7  --  1.2*  PHOS 3.9  --   --   --   --   --    GFR: Estimated Creatinine Clearance: 23.4 mL/min (A) (by C-G formula based on SCr of 1.68 mg/dL (H)). Liver Function Tests: Recent Labs  Lab 12/04/22 1553 12/04/22 2050 12/05/22 1626  12/06/22 0432 12/06/22 1700 12/08/22 0921  AST 22  --  18 21 22 21   ALT 14  --  11 11 13 13   ALKPHOS 46  --  45 39 42 50  BILITOT 0.8  --  0.3 0.6 0.6 0.7  PROT 6.7  --  5.9* 5.3* 5.7* 6.2*  ALBUMIN 3.0* 2.9* 2.7* 2.4* 2.5* 2.7*   No results for input(s): "LIPASE", "AMYLASE" in the last 168 hours. No results for input(s): "AMMONIA" in the last 168 hours. Coagulation Profile: Recent Labs  Lab 12/05/22 0500  INR 3.2*   Cardiac Enzymes: No results for input(s): "CKTOTAL", "CKMB", "CKMBINDEX", "TROPONINI" in the last 168 hours. BNP (last 3 results) No results for input(s): "PROBNP" in the last 8760 hours. HbA1C: No results for input(s): "HGBA1C" in the last 72 hours. CBG:  No results for input(s): "GLUCAP" in the last 168 hours. Lipid Profile: No results for input(s): "CHOL", "HDL", "LDLCALC", "TRIG", "CHOLHDL", "LDLDIRECT" in the last 72 hours. Thyroid Function Tests: No results for input(s): "TSH", "T4TOTAL", "FREET4", "T3FREE", "THYROIDAB" in the last 72 hours.  Anemia Panel: Recent Labs    12/06/22 0714  VITAMINB12 702  FOLATE 12.9  FERRITIN 39  TIBC 221*  IRON 45  RETICCTPCT 1.5   Sepsis Labs: Recent Labs  Lab 12/04/22 1609 12/04/22 1745 12/04/22 2050  PROCALCITON  --   --  <0.10  LATICACIDVEN 1.1 0.6  --     Recent Results (from the past 240 hour(s))  Culture, blood (routine x 2)     Status: None (Preliminary result)   Collection Time: 12/04/22  3:42 PM   Specimen: BLOOD  Result Value Ref Range Status   Specimen Description   Final    BLOOD RIGHT ANTECUBITAL Performed at [facility], [address], [place], [place] [address]    Special Requests   Final    BOTTLES DRAWN AEROBIC AND ANAEROBIC Blood Culture adequate volume Performed at [facility], [address] [place], [place] [address]    Culture   Final    NO GROWTH 3 DAYS Performed at [facility], [address], [place], [place] [address]     Report Status PENDING  Incomplete  Culture, blood (routine x 2)     Status: None (Preliminary result)   Collection Time: 12/04/22  4:39 PM   Specimen: BLOOD RIGHT HAND  Result Value Ref Range Status   Specimen Description   Final    BLOOD RIGHT HAND Performed at [facility], [address], [place], [place] [address]    Special Requests   Final    BOTTLES DRAWN AEROBIC ONLY Blood Culture results may not be optimal due to an inadequate volume of blood received in culture bottles Performed at [facility], [address], [place], [place] [address]    Culture   Final    NO GROWTH 3 DAYS Performed at [facility], [address], [place], [place] [address]    Report Status PENDING  Incomplete  SARS Coronavirus 2 by RT PCR (hospital order, performed in [facility] hospital lab) *cepheid single result test* Anterior Nasal Swab     Status: None   Collection Time: 12/04/22  9:37 PM   Specimen: Anterior Nasal Swab  Result Value Ref Range Status   SARS Coronavirus 2 by RT PCR NEGATIVE NEGATIVE Final    Comment: (NOTE) SARS-CoV-2 target nucleic acids are NOT DETECTED.  The SARS-CoV-2 RNA is generally detectable in upper and lower respiratory specimens during the acute phase of infection. The lowest concentration of SARS-CoV-2 viral copies this assay can detect is 250 copies / mL. A negative result does not preclude SARS-CoV-2 infection and should not be used as the sole basis for treatment or other patient management decisions.  A negative result may occur with improper specimen collection / handling, submission of specimen other than nasopharyngeal swab, presence of viral mutation(s) within the areas targeted by this assay, and inadequate number of viral copies (<250 copies / mL). A negative result must be combined with clinical observations, patient history, and epidemiological information.  Fact Sheet for Patients:    RoadLapTop.co.za  Fact Sheet for Healthcare Providers: http://kim-miller.com/  This test is not yet approved or  cleared by the [place] FDA and has been authorized for  detection and/or diagnosis of SARS-CoV-2 by FDA under an Emergency Use Authorization (EUA).  This EUA will remain in effect (meaning this test can be used) for the duration of the COVID-19 declaration under Section 564(b)(1) of the Act, 21 U.S.C. section 360bbb-3(b)(1), unless the authorization is terminated or revoked sooner.  Performed at [facility], [address] [place], [place] [address]   Respiratory (~20 pathogens) panel by PCR     Status: None   Collection Time: 12/04/22  9:37 PM   Specimen: Anterior Nasal Swab; Respiratory  Result Value Ref Range Status   Adenovirus NOT DETECTED NOT DETECTED Final   Coronavirus 229E NOT DETECTED NOT DETECTED Final    Comment: (NOTE) The Coronavirus on the Respiratory Panel, DOES NOT test for the novel  Coronavirus (2019 nCoV)    Coronavirus HKU1 NOT DETECTED NOT DETECTED Final   Coronavirus NL63 NOT DETECTED NOT DETECTED Final   Coronavirus OC43 NOT DETECTED NOT DETECTED Final   Metapneumovirus NOT DETECTED NOT DETECTED Final   Rhinovirus / Enterovirus NOT DETECTED NOT DETECTED Final   Influenza A NOT DETECTED NOT DETECTED Final   Influenza B NOT DETECTED NOT DETECTED Final   Parainfluenza Virus 1 NOT DETECTED NOT DETECTED Final   Parainfluenza Virus 2 NOT DETECTED NOT DETECTED Final   Parainfluenza Virus 3 NOT DETECTED NOT DETECTED Final   Parainfluenza Virus 4 NOT DETECTED NOT DETECTED Final   Respiratory Syncytial Virus NOT DETECTED NOT DETECTED Final   Bordetella pertussis NOT DETECTED NOT DETECTED Final   Bordetella Parapertussis NOT DETECTED NOT DETECTED Final   Chlamydophila pneumoniae NOT DETECTED NOT DETECTED Final   Mycoplasma pneumoniae NOT DETECTED NOT DETECTED Final    Comment:  Performed at [facility], [address] [place], [place] [address]  C Difficile Quick Screen w PCR reflex     Status: None   Collection Time: 12/05/22 10:17 PM  Result Value Ref Range Status   C Diff antigen NEGATIVE NEGATIVE Final   C Diff toxin NEGATIVE NEGATIVE Final   C Diff interpretation No C. difficile detected.  Final    Comment: Performed at [facility], [address], [place], [place] [address]     Radiology Studies: No results found.   Scheduled Meds:  anastrozole  1 mg Oral Daily   aspirin  81 mg Oral Daily   hydrOXYzine  10 mg Oral TID   influenza vaccine adjuvanted  0.5 mL Intramuscular Tomorrow-1000   loperamide  4 mg Oral TID   rivaroxaban  20 mg Oral Q supper   sodium bicarbonate  650 mg Oral TID   sodium chloride flush  3 mL Intravenous Q12H   sodium chloride HYPERTONIC  4 mL Nebulization Daily   Continuous Infusions:  ampicillin-sulbactam (UNASYN) IV 3 g (12/08/22 0600)     LOS: 4 days    Time spent: 39 min  Alwyn Ren, MD  12/08/2022, 1:20 PM

## 2022-12-08 NOTE — Progress Notes (Signed)
Unable to provide nebulizer treatment to PT at this time due to PT request. RN aware.

## 2022-12-08 NOTE — TOC Progression Note (Addendum)
Transition of Care [facility]) - Progression Note    Patient Details  Name: Stacey [name] MRN: [id number] Date of Birth: [birth year]  Transition of [facility] CM/SW Contact  Larrie Kass, LCSW Phone Number: 12/08/2022, 11:11 AM  Clinical Narrative:    CSW presented bed offers to pt she is requesting sometime to review. CSW encourage pt to contact family members to help with this decision. TOC to follow.   ADDen 2:00pm Pt has chosen [facility] for SNF placement. TOC to follow for d/c needs.  Expected Discharge Plan: Skilled Nursing Facility Barriers to Discharge: Continued Medical Work up  Expected Discharge Plan and Services       Living arrangements for the past 2 months: Single Family Home                                       Social Determinants of Health (SDOH) Interventions SDOH Screenings   Food Insecurity: No Food Insecurity (12/05/2022)  Housing: Low Risk  (12/06/2022)  Transportation Needs: No Transportation Needs (12/06/2022)  Utilities: Not At Risk (12/06/2022)  Alcohol Screen: Low Risk  (11/07/2022)  Depression (PHQ2-9): Low Risk  (11/10/2022)  Financial Resource Strain: Low Risk  (11/07/2022)  Physical Activity: Inactive (11/07/2022)  Social Connections: Socially Isolated (11/07/2022)  Stress: No Stress Concern Present (11/07/2022)  Tobacco Use: Medium Risk (12/04/2022)  Health Literacy: Adequate Health Literacy (11/10/2022)    Readmission Risk Interventions    11/19/2022    1:54 PM  Readmission Risk Prevention Plan  Transportation Screening Complete  PCP or Specialist Appt within 5-7 Days Complete  Home Care Screening Complete  Medication Review (RN CM) Complete

## 2022-12-08 NOTE — Plan of Care (Signed)

## 2022-12-08 NOTE — Progress Notes (Signed)
Physical Therapy Treatment Patient Details Name: Stacey [name] MRN: [id number] DOB: 04-Jan-1941 Today's Date: 12/08/2022   History of Present Illness Pt is an 82 yo female with recent admission on 11/17/22 for sepsis, and pt discharged home.  Pt admitted again on 12/04/22 for acute hypoxic respiratory failure secondary to aspiration pneumonia.  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 Educated pt on hand placement to power up to stand and avoid pulling on RW to pull up. Pt takes lateral steps with bed behind her, min A to steady, initial unsteadiness improves with time. Pt able to take sidestep to recliner. Pt then able to power to stand and complete steps forward and back in front of recliner, min A to steady, unable to leave near bed/recliner. Pt with mild dyspnea noted, on O2 with SpO2 96-100% with mobility. Pt tolerates remaining up in recliner with all needs in reach. Patient will benefit from continued inpatient follow up therapy, <3 hours/day.    If plan is discharge home, recommend the following: Assistance with cooking/housework;Help with stairs or ramp for entrance;A little help with walking and/or transfers;A little help with bathing/dressing/bathroom   Can travel by private vehicle     Yes  Equipment Recommendations  None recommended by PT    Recommendations for Other Services       Precautions / Restrictions Precautions Precautions: Fall Precaution Comments: monitor sats, chronic diarrhea Restrictions Weight Bearing Restrictions: No     Mobility  Bed Mobility Overal bed mobility: Needs Assistance Bed Mobility: Supine to Sit     Supine to sit: Contact guard, HOB elevated, Used rails     General bed mobility comments: CGA, pt pulling on bedrails and cues for hand  placement to scoot forward to place feet on floor    Transfers Overall transfer level: Needs assistance Equipment used: Rolling walker (2 wheels) Transfers: Sit to/from Stand, Bed to chair/wheelchair/BSC Sit to Stand: Min assist   Step pivot transfers: Min assist       General transfer comment: verbal cues for hand placement to push from seated surface, min A to power up from EOB and recliner; min A for step pivot to recliner, increased time, verbal cues for pursed lip breathing    Ambulation/Gait Ambulation/Gait assistance: Min assist   Assistive device: Rolling walker (2 wheels)   Gait velocity: decreased     General Gait Details: pt performs static marching in place, initial unsteadiness with pt reporting "jelly knees" improving with stance time, pt able to take sidesteps laterally along bed, then take 2 steps forward and back with RW, no LE buckling noted or overt LOB   Stairs             Wheelchair Mobility     Tilt Bed    Modified Rankin (Stroke Patients Only)       Balance Overall balance assessment: Mild deficits observed, not formally tested                                          Cognition Arousal: Alert Behavior During Therapy: Anxious Overall Cognitive Status: Within Functional Limits for tasks assessed  General Comments: pt reports feeling anxious regarding mobility and choosing facility        Exercises      General Comments        Pertinent Vitals/Pain Pain Assessment Pain Assessment: No/denies pain    Home Living                          Prior Function            PT Goals (current goals can now be found in the care plan section) Progress towards PT goals: Progressing toward goals    Frequency    Min 1X/week      PT Plan      Co-evaluation              AM-PAC PT "6 Clicks" Mobility   Outcome Measure  Help needed turning from your  back to your side while in a flat bed without using bedrails?: A Little Help needed moving from lying on your back to sitting on the side of a flat bed without using bedrails?: A Little Help needed moving to and from a bed to a chair (including a wheelchair)?: A Little Help needed standing up from a chair using your arms (e.g., wheelchair or bedside chair)?: A Little Help needed to walk in hospital room?: A Lot Help needed climbing 3-5 steps with a railing? : A Lot 6 Click Score: 16    End of Session Equipment Utilized During Treatment: Gait belt;Oxygen Activity Tolerance: Patient tolerated treatment well Patient left: in chair;with call bell/phone within reach Nurse Communication: Mobility status PT Visit Diagnosis: Other abnormalities of gait and mobility (R26.89);Muscle weakness (generalized) (M62.81)     Time: 7253-6644 PT Time Calculation (min) (ACUTE ONLY): 31 min  Charges:    $Therapeutic Activity: 23-37 mins PT General Charges $$ ACUTE PT VISIT: 1 Visit                     Tori Magaly Pollina PT, DPT 12/08/22, 3:14 PM

## 2022-12-09 DIAGNOSIS — J189 Pneumonia, unspecified organism: Secondary | ICD-10-CM | POA: Diagnosis not present

## 2022-12-09 LAB — CULTURE, BLOOD (ROUTINE X 2)
Culture: NO GROWTH
Culture: NO GROWTH
Special Requests: ADEQUATE

## 2022-12-09 LAB — COMPREHENSIVE METABOLIC PANEL
ALT: 11 U/L (ref 0–44)
AST: 17 U/L (ref 15–41)
Albumin: 2.3 g/dL — ABNORMAL LOW (ref 3.5–5.0)
Alkaline Phosphatase: 40 U/L (ref 38–126)
Anion gap: 7 (ref 5–15)
BUN: 17 mg/dL (ref 8–23)
CO2: 28 mmol/L (ref 22–32)
Calcium: 7.1 mg/dL — ABNORMAL LOW (ref 8.9–10.3)
Chloride: 102 mmol/L (ref 98–111)
Creatinine, Ser: 1.74 mg/dL — ABNORMAL HIGH (ref 0.44–1.00)
GFR, Estimated: 29 mL/min — ABNORMAL LOW (ref >60)
Glucose, Bld: 103 mg/dL — ABNORMAL HIGH (ref 70–99)
Potassium: 4.4 mmol/L (ref 3.5–5.1)
Sodium: 137 mmol/L (ref 135–145)
Total Bilirubin: 0.5 mg/dL (ref <1.2)
Total Protein: 5.3 g/dL — ABNORMAL LOW (ref 6.5–8.1)

## 2022-12-09 LAB — CBC
HCT: 26.8 % — ABNORMAL LOW (ref 36.0–46.0)
Hemoglobin: 8.7 g/dL — ABNORMAL LOW (ref 12.0–15.0)
MCH: 33.6 pg (ref 26.0–34.0)
MCHC: 32.5 g/dL (ref 30.0–36.0)
MCV: 103.5 fL — ABNORMAL HIGH (ref 80.0–100.0)
Platelets: 120 10*3/uL — ABNORMAL LOW (ref 150–400)
RBC: 2.59 MIL/uL — ABNORMAL LOW (ref 3.87–5.11)
RDW: 17.6 % — ABNORMAL HIGH (ref 11.5–15.5)
WBC: 3.1 10*3/uL — ABNORMAL LOW (ref 4.0–10.5)
nRBC: 0 % (ref 0.0–0.2)

## 2022-12-09 LAB — MAGNESIUM: Magnesium: 1.5 mg/dL — ABNORMAL LOW (ref 1.7–2.4)

## 2022-12-09 MED ORDER — MAGNESIUM SULFATE 2 GM/50ML IV SOLN
2.0000 g | Freq: Once | INTRAVENOUS | Status: AC
  Administered 2022-12-09: 2 g via INTRAVENOUS
  Filled 2022-12-09: qty 50

## 2022-12-09 MED ORDER — IPRATROPIUM-ALBUTEROL 0.5-2.5 (3) MG/3ML IN SOLN
3.0000 mL | Freq: Four times a day (QID) | RESPIRATORY_TRACT | Status: AC
  Administered 2022-12-09 – 2022-12-10 (×5): 3 mL via RESPIRATORY_TRACT
  Filled 2022-12-09 (×8): qty 3

## 2022-12-09 MED ORDER — DEXTROSE 5 % IV SOLN: 500.0000 mg | INTRAVENOUS | Status: DC

## 2022-12-09 MED ORDER — BUDESONIDE 0.25 MG/2ML IN SUSP
0.2500 mg | Freq: Two times a day (BID) | RESPIRATORY_TRACT | Status: DC
  Administered 2022-12-09 – 2022-12-16 (×14): 0.25 mg via RESPIRATORY_TRACT
  Filled 2022-12-09 (×15): qty 2

## 2022-12-09 MED ORDER — ARFORMOTEROL TARTRATE 15 MCG/2ML IN NEBU
15.0000 ug | INHALATION_SOLUTION | Freq: Two times a day (BID) | RESPIRATORY_TRACT | Status: DC
  Administered 2022-12-09 – 2022-12-16 (×14): 15 ug via RESPIRATORY_TRACT
  Filled 2022-12-09 (×15): qty 2

## 2022-12-09 NOTE — Plan of Care (Signed)

## 2022-12-09 NOTE — Progress Notes (Signed)
PROGRESS NOTE    Stacey [name]  JYN:[id number] DOB: [birth year] DOA: 12/04/2022 PCP: [name], MD   Brief Narrative:  82 y.o. female with medical history significant of metastaic breast ca for which patient is on  ibrance. Patient was discharged from the [facility] health system on October 18, approximately 2 weeks ago after treatment for sepsis secondary to pneumonia that was present on admission affecting the right lower lobe. Overall she received 7 days of antibiotics.  She initially felt better but then she began to have shortness of breath generalized weakness. Patient does have chronic kidney disease stage IIIb and also has chronic diarrhea that has been attributed to [place] use for which she takes Imodium at home.   Today, pt still requiring about 5L of O2, still SOB. Resolving urinary retention    Assessment & Plan:   Principal Problem:   Pneumonia Active Problems:   Anemia   HTN (hypertension)   Recurrent cancer of left breast (HCC)   Metabolic acidosis   Hypocalcemia   Fatigue   Immunosuppression due to chronic steroid use (HCC)   DVT (deep venous thrombosis) (HCC)   Diarrhea   Acute respiratory failure with hypoxia (HCC)  Acute hypoxic respiratory failure secondary to ?HCAP Currently on 5L of O2 Completed a course of IV antibiotics 5 days duration on 12/08/2022. Repeat chest x-ray on 12/08/2022 with development of small bilateral pleural effusions and bibasilar airspace disease, most likely atelectasis. Infection or aspiration could look similar.  CT chest no pulmonary embolism.  Mild bilateral bronchial wall thickening consistent with bronchitis.  New clustered solid nodule seen in the lingula and unchanged clustered nodules and mucous plugging of the right lower lobe likely due to infection or aspiration.  Respiratory virus panel negative.  COVID negative. Flu was neg -2 weeks ago. Continue IS/Flutter valve/nebs  Chronic distolic HF BNP 164 ECHO showed EF of 60-65%,  grade 1 DD Plan to trial lasix  History of DVT Continue Xarelto  Chronic diarrhea  Secondary to Ibrance in addition to recent antibiotic use and multiple bowel movements stool C. Difficile negative   Multiple electrolyte abnormalities  Hypomagnesemia/hypocalcemia Replace prn  AKI on CKD stage IIIb Likely 2/2 acute urinary retention- improving Bladder scan with noted retention, in and out X 2 on 11/5 and 11/6 Renal ultrasound unremarkable Daily BMP  History of hypertension Not on any antihypertensives prior to admission however her blood pressure has been running soft Started on midodrine   Acute on chronic anemia of chronic disease/ malignancy Pancytopenia Hemoglobin dropped to 6.5 from 7.6 with no evidence of bleeding S/p 1 unit of prbc.  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  Severe anxiety  does not take anything at home, was started on Xanax as needed with Atarax.      Estimated body mass index is 25.38 kg/m as calculated from the following:   Height as of this encounter: 5\' 3"  (1.6 m).   Weight as of this encounter: 65 kg.  DVT prophylaxis: Xarelto code Status: Full code Family Communication: None at bedside Disposition Plan:  Status is: Inpatient Remains inpatient appropriate because: Acute hypoxia   Consultants:  None  Procedures: None  Antimicrobials: None   Objective: Vitals:   12/09/22 0834 12/09/22 0955 12/09/22 1041 12/09/22 1214  BP:    (!) 119/55  Pulse:    85  Resp:      Temp:    98.6 F (37 C)  TempSrc:    Oral  SpO2: 90% 91% 93% 95%  Weight:      Height:        Intake/Output Summary (Last 24 hours) at 12/09/2022 1840 Last data filed at 12/09/2022 1624 Gross per 24 hour  Intake 440 ml  Output 1668 ml  Net -1228 ml   Filed Weights   12/05/22 1101 12/05/22 1245 12/06/22 0524  Weight: 61.2 kg 64.2 kg 65 kg    Examination: General: NAD   Cardiovascular: S1, S2 present Respiratory: CTAB Abdomen: Soft, nontender, nondistended, bowel sounds present Musculoskeletal: RLE edema > than LLE Skin: Normal Psychiatry: Anxious mood    Data Reviewed: I have personally reviewed following labs and imaging studies  CBC: Recent Labs  Lab 12/04/22 1553 12/05/22 0500 12/06/22 0432 12/06/22 1700 12/08/22 0921 12/09/22 0408  WBC 3.4* 2.7* 3.6*  --  3.1* 3.1*  NEUTROABS 2.5  --   --   --   --   --   HGB 8.3* 7.6* 6.5* 9.3* 10.2* 8.7*  HCT 25.2* 22.7* 19.3* 28.0* 31.0* 26.8*  MCV 107.2* 104.6* 105.5*  --  104.4* 103.5*  PLT 185 168 147*  --  133* 120*   Basic Metabolic Panel: Recent Labs  Lab 12/04/22 2050 12/05/22 0500 12/05/22 1626 12/06/22 0432 12/06/22 1700 12/08/22 0921 12/09/22 0408  NA 143 138 140 139 145 143 137  K 2.9* 3.0* 3.5 3.0* 3.2* 3.8 4.4  CL 114* 106 104 99 101 100 102  CO2 16* 19* 24 29 28 30 28   GLUCOSE 112* 184* 170* 142* 114* 142* 103*  BUN 19 15 14 15 13 15 17   CREATININE 1.22* 1.23* 1.13* 1.30* 1.43* 1.68* 1.74*  CALCIUM 6.2* 6.7* 6.5* 6.0* 6.6* 6.9* 7.1*  MG <0.5* 0.9* 1.9 1.7  --  1.2* 1.5*  PHOS 3.9  --   --   --   --   --   --    GFR: Estimated Creatinine Clearance: 22.6 mL/min (A) (by C-G formula based on SCr of 1.74 mg/dL (H)). Liver Function Tests: Recent Labs  Lab 12/05/22 1626 12/06/22 0432 12/06/22 1700 12/08/22 0921 12/09/22 0408  AST 18 21 22 21 17   ALT 11 11 13 13 11   ALKPHOS 45 39 42 50 40  BILITOT 0.3 0.6 0.6 0.7 0.5  PROT 5.9* 5.3* 5.7* 6.2* 5.3*  ALBUMIN 2.7* 2.4* 2.5* 2.7* 2.3*   No results for input(s): "LIPASE", "AMYLASE" in the last 168 hours. No results for input(s): "AMMONIA" in the last 168 hours. Coagulation Profile: Recent Labs  Lab 12/05/22 0500  INR 3.2*   Cardiac Enzymes: No results for input(s): "CKTOTAL", "CKMB", "CKMBINDEX", "TROPONINI" in the last 168 hours. BNP (last 3 results) No results for input(s): "PROBNP" in the last 8760  hours. HbA1C: No results for input(s): "HGBA1C" in the last 72 hours. CBG: No results for input(s): "GLUCAP" in the last 168 hours. Lipid Profile: No results for input(s): "CHOL", "HDL", "LDLCALC", "TRIG", "CHOLHDL", "LDLDIRECT" in the last 72 hours. Thyroid Function Tests: No results for input(s): "TSH", "T4TOTAL", "FREET4", "T3FREE", "THYROIDAB" in the last 72 hours.  Anemia Panel: No results for input(s): "VITAMINB12", "FOLATE", "FERRITIN", "TIBC", "IRON", "RETICCTPCT" in the last 72 hours.  Sepsis Labs: Recent Labs  Lab 12/04/22 1609 12/04/22 1745 12/04/22 2050  PROCALCITON  --   --  <0.10  LATICACIDVEN 1.1 0.6  --     Recent Results (from the past 240 hour(s))  Culture, blood (routine x 2)     Status: None  Collection Time: 12/04/22  3:42 PM   Specimen: BLOOD  Result Value Ref Range Status   Specimen Description   Final    BLOOD RIGHT ANTECUBITAL Performed at [facility], [address], [place], [place] [address]    Special Requests   Final    BOTTLES DRAWN AEROBIC AND ANAEROBIC Blood Culture adequate volume Performed at [facility], [address] [place], [place] [address]    Culture   Final    NO GROWTH 5 DAYS Performed at [facility], [address] [place], [place] [address]    Report Status 12/09/2022 FINAL  Final  Culture, blood (routine x 2)     Status: None   Collection Time: 12/04/22  4:39 PM   Specimen: BLOOD RIGHT HAND  Result Value Ref Range Status   Specimen Description   Final    BLOOD RIGHT HAND Performed at [facility], [address], [place], [place] [address]    Special Requests   Final    BOTTLES DRAWN AEROBIC ONLY Blood Culture results may not be optimal due to an inadequate volume of blood received in culture bottles Performed at [facility], [address], [place], [place] [address]    Culture   Final    NO GROWTH 5 DAYS Performed at [facility], [address] [place], [place] [address]    Report Status 12/09/2022 FINAL  Final  SARS Coronavirus 2 by RT PCR (hospital order, performed in [facility] hospital lab) *cepheid single result test* Anterior Nasal Swab     Status: None   Collection Time: 12/04/22  9:37 PM   Specimen: Anterior Nasal Swab  Result Value Ref Range Status   SARS Coronavirus 2 by RT PCR NEGATIVE NEGATIVE Final    Comment: (NOTE) SARS-CoV-2 target nucleic acids are NOT DETECTED.  The SARS-CoV-2 RNA is generally detectable in upper and lower respiratory specimens during the acute phase of infection. The lowest concentration of SARS-CoV-2 viral copies this assay can detect is 250 copies / mL. A negative result does not preclude SARS-CoV-2 infection and should not be used as the sole basis for treatment or other patient management decisions.  A negative result may occur with improper specimen collection / handling, submission of specimen other than nasopharyngeal swab, presence of viral mutation(s) within the areas targeted by this assay, and inadequate number of viral copies (<250 copies / mL). A negative result must be combined with clinical observations, patient history, and epidemiological information.  Fact Sheet for Patients:   RoadLapTop.co.za  Fact Sheet for Healthcare Providers: http://kim-miller.com/  This test is not yet approved or  cleared by the [place] FDA and has been authorized for detection and/or diagnosis of SARS-CoV-2 by FDA under an Emergency Use Authorization (EUA).  This EUA will remain in effect (meaning this test can be used) for the duration of the COVID-19 declaration under Section 564(b)(1) of the Act, 21 U.S.C. section 360bbb-3(b)(1), unless the authorization is terminated or revoked sooner.  Performed at [facility], [address], [place], [place] [address]   Respiratory (~20 pathogens) panel by PCR      Status: None   Collection Time: 12/04/22  9:37 PM   Specimen: Anterior Nasal Swab; Respiratory  Result Value Ref Range Status   Adenovirus NOT DETECTED NOT DETECTED Final   Coronavirus 229E NOT DETECTED NOT DETECTED Final    Comment: (NOTE) The Coronavirus on the Respiratory Panel,  DOES NOT test for the novel  Coronavirus (2019 nCoV)    Coronavirus HKU1 NOT DETECTED NOT DETECTED Final   Coronavirus NL63 NOT DETECTED NOT DETECTED Final   Coronavirus OC43 NOT DETECTED NOT DETECTED Final   Metapneumovirus NOT DETECTED NOT DETECTED Final   Rhinovirus / Enterovirus NOT DETECTED NOT DETECTED Final   Influenza A NOT DETECTED NOT DETECTED Final   Influenza B NOT DETECTED NOT DETECTED Final   Parainfluenza Virus 1 NOT DETECTED NOT DETECTED Final   Parainfluenza Virus 2 NOT DETECTED NOT DETECTED Final   Parainfluenza Virus 3 NOT DETECTED NOT DETECTED Final   Parainfluenza Virus 4 NOT DETECTED NOT DETECTED Final   Respiratory Syncytial Virus NOT DETECTED NOT DETECTED Final   Bordetella pertussis NOT DETECTED NOT DETECTED Final   Bordetella Parapertussis NOT DETECTED NOT DETECTED Final   Chlamydophila pneumoniae NOT DETECTED NOT DETECTED Final   Mycoplasma pneumoniae NOT DETECTED NOT DETECTED Final    Comment: Performed at [facility], [address] [place], [place] [address]  C Difficile Quick Screen w PCR reflex     Status: None   Collection Time: 12/05/22 10:17 PM  Result Value Ref Range Status   C Diff antigen NEGATIVE NEGATIVE Final   C Diff toxin NEGATIVE NEGATIVE Final   C Diff interpretation No C. difficile detected.  Final    Comment: Performed at [facility], [address], [place], [place] [address]     Radiology Studies: US RENAL  Result Date: 12/08/2022 CLINICAL DATA:  Acute kidney injury. EXAM: RENAL / URINARY TRACT ULTRASOUND COMPLETE COMPARISON:  None Available. FINDINGS: Right Kidney: Renal measurements: 8.4 cm x 4.7 cm x 4.1 cm = volume:  84.5 mL. Echogenicity within normal limits. No mass or hydronephrosis visualized. Left Kidney: Renal measurements: 8.5 cm x 4.4 cm x 4.9 cm = volume: 95.1 mL. Echogenicity within normal limits. No mass or hydronephrosis visualized. Bladder: Appears normal for degree of bladder distention. The bilateral ureteral jets are not visualized. Other: Of incidental note is the presence of a 1.5 cm x 1.2 cm x 1.6 cm cystic appearing structure within the posterior aspect of the right lobe of the liver. No flow is seen within this region on color Doppler evaluation. IMPRESSION: 1. Unremarkable renal ultrasound. 2. Simple hepatic cyst. Electronically Signed   By: Aram Candela M.D.   On: 12/08/2022 19:57   DG Chest 1 View  Result Date: 12/08/2022 CLINICAL DATA:  Cough EXAM: CHEST  1 VIEW COMPARISON:  12/04/2022 plain film and CT FINDINGS: Chin overlies the apices minimally. Midline trachea. Cardiomegaly accentuated by AP portable technique. Small bilateral pleural effusions are new. No pneumothorax. Bibasilar airspace disease. IMPRESSION: Development of small bilateral pleural effusions and bibasilar airspace disease, most likely atelectasis. Infection or aspiration could look similar. Aortic Atherosclerosis (ICD10-I70.0). Electronically Signed   By: Jeronimo Greaves M.D.   On: 12/08/2022 18:05     Scheduled Meds:  anastrozole  1 mg Oral Daily   arformoterol  15 mcg Nebulization BID   aspirin  81 mg Oral Daily   budesonide (PULMICORT) nebulizer solution  0.25 mg Nebulization BID   hydrOXYzine  10 mg Oral TID   influenza vaccine adjuvanted  0.5 mL Intramuscular Tomorrow-1000   ipratropium-albuterol  3 mL Nebulization Q6H   midodrine  2.5 mg Oral TID WC   rivaroxaban  20 mg Oral Q supper   sodium chloride flush  3 mL Intravenous Q12H   sodium chloride HYPERTONIC  4 mL Nebulization  Daily   Continuous Infusions:     LOS: 5 days      Briant Cedar, MD  12/09/2022, 6:40 PM

## 2022-12-10 ENCOUNTER — Encounter: Payer: Self-pay | Admitting: Oncology

## 2022-12-10 ENCOUNTER — Ambulatory Visit (INDEPENDENT_AMBULATORY_CARE_PROVIDER_SITE_OTHER): Payer: Medicare Other | Admitting: Otolaryngology

## 2022-12-10 ENCOUNTER — Inpatient Hospital Stay (HOSPITAL_COMMUNITY): Payer: Medicare Other

## 2022-12-10 DIAGNOSIS — J189 Pneumonia, unspecified organism: Secondary | ICD-10-CM | POA: Diagnosis not present

## 2022-12-10 LAB — CBC WITH DIFFERENTIAL/PLATELET
Abs Immature Granulocytes: 0.01 10*3/uL (ref 0.00–0.07)
Basophils Absolute: 0 10*3/uL (ref 0.0–0.1)
Basophils Relative: 1 %
Eosinophils Absolute: 0.1 10*3/uL (ref 0.0–0.5)
Eosinophils Relative: 2 %
HCT: 25.7 % — ABNORMAL LOW (ref 36.0–46.0)
Hemoglobin: 8.3 g/dL — ABNORMAL LOW (ref 12.0–15.0)
Immature Granulocytes: 0 %
Lymphocytes Relative: 17 %
Lymphs Abs: 0.4 10*3/uL — ABNORMAL LOW (ref 0.7–4.0)
MCH: 34.4 pg — ABNORMAL HIGH (ref 26.0–34.0)
MCHC: 32.3 g/dL (ref 30.0–36.0)
MCV: 106.6 fL — ABNORMAL HIGH (ref 80.0–100.0)
Monocytes Absolute: 0.2 10*3/uL (ref 0.1–1.0)
Monocytes Relative: 10 %
Neutro Abs: 1.7 10*3/uL (ref 1.7–7.7)
Neutrophils Relative %: 70 %
Platelets: 111 10*3/uL — ABNORMAL LOW (ref 150–400)
RBC: 2.41 MIL/uL — ABNORMAL LOW (ref 3.87–5.11)
RDW: 17.1 % — ABNORMAL HIGH (ref 11.5–15.5)
WBC: 2.5 10*3/uL — ABNORMAL LOW (ref 4.0–10.5)
nRBC: 0 % (ref 0.0–0.2)

## 2022-12-10 LAB — BASIC METABOLIC PANEL
Anion gap: 10 (ref 5–15)
BUN: 22 mg/dL (ref 8–23)
CO2: 25 mmol/L (ref 22–32)
Calcium: 7.4 mg/dL — ABNORMAL LOW (ref 8.9–10.3)
Chloride: 101 mmol/L (ref 98–111)
Creatinine, Ser: 1.77 mg/dL — ABNORMAL HIGH (ref 0.44–1.00)
GFR, Estimated: 28 mL/min — ABNORMAL LOW (ref >60)
Glucose, Bld: 109 mg/dL — ABNORMAL HIGH (ref 70–99)
Potassium: 4.3 mmol/L (ref 3.5–5.1)
Sodium: 136 mmol/L (ref 135–145)

## 2022-12-10 LAB — MAGNESIUM: Magnesium: 1.9 mg/dL (ref 1.7–2.4)

## 2022-12-10 MED ORDER — FUROSEMIDE 10 MG/ML IJ SOLN
40.0000 mg | Freq: Once | INTRAMUSCULAR | Status: AC
  Administered 2022-12-10: 40 mg via INTRAVENOUS
  Filled 2022-12-10: qty 4

## 2022-12-10 MED ORDER — SODIUM CHLORIDE 3 % IN NEBU
4.0000 mL | INHALATION_SOLUTION | Freq: Two times a day (BID) | RESPIRATORY_TRACT | Status: AC
  Administered 2022-12-10 – 2022-12-12 (×6): 4 mL via RESPIRATORY_TRACT
  Filled 2022-12-10 (×6): qty 4

## 2022-12-10 MED ORDER — DM-GUAIFENESIN ER 30-600 MG PO TB12
1.0000 | ORAL_TABLET | Freq: Two times a day (BID) | ORAL | Status: DC
  Administered 2022-12-10 – 2022-12-16 (×13): 1 via ORAL
  Filled 2022-12-10 (×13): qty 1

## 2022-12-10 MED ORDER — PANTOPRAZOLE SODIUM 40 MG PO TBEC
40.0000 mg | DELAYED_RELEASE_TABLET | Freq: Two times a day (BID) | ORAL | Status: DC
  Administered 2022-12-10 – 2022-12-16 (×13): 40 mg via ORAL
  Filled 2022-12-10 (×13): qty 1

## 2022-12-10 MED ORDER — MIDODRINE HCL 5 MG PO TABS
2.5000 mg | ORAL_TABLET | Freq: Two times a day (BID) | ORAL | Status: DC
  Administered 2022-12-10 – 2022-12-13 (×7): 2.5 mg via ORAL
  Filled 2022-12-10 (×7): qty 1

## 2022-12-10 NOTE — Plan of Care (Signed)
  Problem: Clinical Measurements: Goal: Ability to maintain clinical measurements within normal limits will improve Outcome: Progressing   Problem: Activity: Goal: Risk for activity intolerance will decrease Outcome: Progressing   Problem: Nutrition: Goal: Adequate nutrition will be maintained Outcome: Progressing   Problem: Coping: Goal: Level of anxiety will decrease Outcome: Progressing   Problem: Pain Management: Goal: General experience of comfort will improve Outcome: Progressing   Problem: Safety: Goal: Ability to remain free from injury will improve Outcome: Progressing

## 2022-12-10 NOTE — Progress Notes (Signed)
Physical Therapy Treatment Patient Details Name: Stacey [name] MRN: [id number] DOB: 13-Aug-1940 Today's Date: 12/10/2022   History of Present Illness Pt is an 82 yo female with recent admission on 11/17/22 for sepsis, and pt discharged home.  Pt admitted again on 12/04/22 for acute hypoxic respiratory failure secondary to aspiration pneumonia.  Pt with hx including but not limited to breast CA with bone mets on chronic immunotherapy, L UE lymphedema, OA, DVT    PT Comments  Pt progressing toward goals; continues to fatigue easily although tol more activity compared to previous session. SpO2=87-95% on 5L O2 throughout session. Continue PT POC. D/c plan remains appropriate    If plan is discharge home, recommend the following: Assistance with cooking/housework;Help with stairs or ramp for entrance;A little help with walking and/or transfers;A little help with bathing/dressing/bathroom   Can travel by private vehicle     No  Equipment Recommendations  None recommended by PT    Recommendations for Other Services       Precautions / Restrictions Precautions Precautions: Fall Precaution Comments: monitor sats, chronic diarrhea, urinary urgency Restrictions Weight Bearing Restrictions: No     Mobility  Bed Mobility Overal bed mobility: Needs Assistance Bed Mobility: Supine to Sit, Sit to Supine     Supine to sit: Contact guard, HOB elevated, Used rails Sit to supine: Min assist   General bed mobility comments: CGA, pt pulling on bedrails, HOB elevated 80 degrees; incr time an deffort wtih cues to scoot to EOB; assist to lift RLE on to bed    Transfers Overall transfer level: Needs assistance Equipment used: Rolling walker (2 wheels) Transfers: Sit to/from Stand, Bed to chair/wheelchair/BSC Sit to Stand: Min assist, Contact guard assist   Step pivot transfers: Min assist       General transfer comment: verbal cues for hand placement to push from seated surface, CGA to min A to  power up from EOB; min A for step pivot bed<>BSC, increased time, verbal cues for pursed lip breathing and posture; STS repeated x5 for LE strengthenign and activity tol with pt needing incr assist with fatigue; SpO2=87-95% on 5L    Ambulation/Gait               General Gait Details: lateral steps along EOB only d/t fatigue and  urinary frequency   Stairs             Wheelchair Mobility     Tilt Bed    Modified Rankin (Stroke Patients Only)       Balance Overall balance assessment: Needs assistance         Standing balance support: Single extremity supported, Reliant on assistive device for balance Standing balance-Leahy Scale: Poor Standing balance comment: reliant on at least unilateral UE support for static standing; bil for dynamic with intermittent external assist                            Cognition Arousal: Alert Behavior During Therapy: Anxious Overall Cognitive Status: Within Functional Limits for tasks assessed                                 General Comments: pt states she is better but not bc of doing any therapy        Exercises      General Comments        Pertinent Vitals/Pain Pain Assessment Pain  Assessment: No/denies pain    Home Living                          Prior Function            PT Goals (current goals can now be found in the care plan section) Acute Rehab PT Goals Patient Stated Goal: return home PT Goal Formulation: With patient Time For Goal Achievement: 12/20/22 Potential to Achieve Goals: Good Progress towards PT goals: Progressing toward goals    Frequency    Min 1X/week      PT Plan      Co-evaluation              AM-PAC PT "6 Clicks" Mobility   Outcome Measure  Help needed turning from your back to your side while in a flat bed without using bedrails?: A Little Help needed moving from lying on your back to sitting on the side of a flat bed without  using bedrails?: A Little Help needed moving to and from a bed to a chair (including a wheelchair)?: A Little Help needed standing up from a chair using your arms (e.g., wheelchair or bedside chair)?: A Little Help needed to walk in hospital room?: A Lot Help needed climbing 3-5 steps with a railing? : A Lot 6 Click Score: 16    End of Session Equipment Utilized During Treatment: Oxygen Activity Tolerance: Patient tolerated treatment well Patient left: in bed;with call bell/phone within reach;with bed alarm set   PT Visit Diagnosis: Other abnormalities of gait and mobility (R26.89);Muscle weakness (generalized) (M62.81)     Time: 9562-1308 PT Time Calculation (min) (ACUTE ONLY): 22 min  Charges:    $Therapeutic Activity: 8-22 mins PT General Charges $$ ACUTE PT VISIT: 1 Visit                     Jerami Tammen, PT  Acute Rehab Dept [facility]) [phone]  12/10/2022    [facility] 12/10/2022, 4:13 PM

## 2022-12-10 NOTE — Progress Notes (Signed)
Subjective: Patient seen at bedside. she is feeling ok today, sitting upright and was able to have a conversation without having to stop to breathe. she says she is having to urinate every 15 min. she also hopes to go to rehab when she leaves to get stronger. she should continue on anastrozole, Ibrance on hold for concern of pneumonia  Objective: Vital signs in last 24 hours: Temp:  [98 F (36.7 C)-98.9 F (37.2 C)] 98.7 F (37.1 C) (11/07 1246) Pulse Rate:  [90-95] 95 (11/07 1246) Resp:  [20-24] 24 (11/07 0340) BP: (113-144)/(57-66) 113/66 (11/07 1246) SpO2:  [85 %-96 %] 90 % (11/07 1436)  Intake/Output from previous day: 11/06 0701 - 11/07 0700 In: 320 [P.O.:270; IV Piggyback:50] Out: 1325 [Urine:1325] Intake/Output this shift: No intake/output data recorded.  she appears alert, oriented and in no acute distress . she is on 5 L oxygen  Lab Results:  Recent Labs    12/09/22 0408 12/10/22 0419  WBC 3.1* 2.5*  HGB 8.7* 8.3*  HCT 26.8* 25.7*  PLT 120* 111*   BMET Recent Labs    12/09/22 0408 12/10/22 0419  NA 137 136  K 4.4 4.3  CL 102 101  CO2 28 25  GLUCOSE 103* 109*  BUN 17 22  CREATININE 1.74* 1.77*  CALCIUM 7.1* 7.4*    Studies/Results: DG CHEST PORT 1 VIEW  Result Date: 12/10/2022 CLINICAL DATA:  Acute respiratory failure.  Shortness of breath EXAM: PORTABLE CHEST 1 VIEW COMPARISON:  12/08/2022 FINDINGS: Surgical clips of the left breast and left axilla. Patient rotated to the right. Midline trachea. Mild cardiomegaly. Atherosclerosis in the transverse aorta. Similar small bilateral pleural effusions. No pneumothorax. Suspect pulmonary venous congestion. Persistent bibasilar airspace disease. IMPRESSION: No significant change since 12/08/2022. Small bilateral pleural effusions with adjacent airspace disease, most likely atelectasis. Cardiomegaly with mild pulmonary venous congestion. Aortic Atherosclerosis (ICD10-I70.0). Electronically Signed   By: Jeronimo Greaves  M.D.   On: 12/10/2022 15:09    Medications: I have reviewed the patient's current medications.  Assessment/Plan: This is a very well known patient to me with metastatic breast cancer on Ibrance and anastrozole admiited recently for treatment of pneumonia and readmitted with worsening Sob.  Primary team wonders if she could be having SOB related to [place] use. Pneumonitis is very rarely reported with Ibrance,  I discussed with the patient that we will try to evaluate and rule out other etiologies for SOB we appreciate Pulmonary team's input and she will continue on diuretics and PPI if she fails to improve with these measures, we can consider HRCT or consider bronchoscopy for further evaluation. She can continue anastrozole in the interim for the treatment of breast cancer I can arrange for her to be seen in 2 weeks outpt  Thank you for assisting [place] in the care of this patient.   LOS: 6 days    Stacey [name] 12/10/2022

## 2022-12-10 NOTE — Consult Note (Signed)
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 Completed abx at home but still had some shortness of breath and fatigue (with mix of both progressing). She denied fever, chest pain, vomiting, syncope. Did endorse dry non-productive cough. Her weakness progressed to point she could not get OOB so EMS was called on 11/1. Initial CXR negative for acute disease. F/u Ct chest neg for PE but did show subpleural chronic changes, bilateral bronchial wall thickening. New solid nodules in lingula and mucous plugging or RLL. Also marked kyphosis, mod hiatal hernia and emphysema changes.  She was placed on supplemental oxygen, IV unasyn to cover for aspiration, and IV hydration. Since admit she completed 5 d of abx. Had a swallow eval which was neg for aspiration but continues to require supplemental oxygen. PCCM asked to see because of her recurrent shortness of breath and on-going O2 needs.   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 rate (!) 24, height 5\' 3"  (1.6 m), weight 65 kg, SpO2 91%.         Intake/Output Summary (Last 24 hours) at 12/10/2022 1053 Last data filed at 12/10/2022 0809 Gross per 24 hour  Intake 290 ml  Output 1525 ml  Net -1235 ml   Filed Weights   12/05/22 1101 12/05/22 1245 12/06/22 0524  Weight: 61.2 kg 64.2 kg 65 kg    Examination: General: frail 82 year old female sitting up in bed HENT: NCAT no JVD  Lungs: bilateral bronchial breath sounds w/ R>L basilar rales. Currenlty on 2 lpm, able to speak full sentences.  PCXR w/ ongoing bibasilar airspace disease. Not improved when c/w 5th. Prob element of left effusion  POCUS bilateral chest. R>L free flowing small to mod pleural effusions Cardiovascular: RRR Abdomen: soft  Extremities: dependent edema/warm  Neuro: oriented x3 GU: voids   Resolved Hospital Problem list     Assessment & Plan:  Acute hypoxic respiratory failure  Kyphosis  Bilateral atelectasis  Mucous plugging  Hiatal hernia  Severe reflux Recurrent aspiration  Bilateral free flowing pleural effusions Pulmonary nodules Stage IV breast CA H/o DVT CKD stage IIIB  Pulm discussion  Acute hypoxic respiratory failure w/ on-going oxygen requirements due to: mucous plugging w/weak cough, recurrent aspiration due to Hiatal hernia and poorly controlled reflux, bilateral free flowing pleural effusions and complicated further by restrictive physiology from her kyphosis  Plan Cont supplemental oxygen  Cont IS q hr while awake Add flutter Add HT Saline neb  Cont BDs Add mucinex  Lasix x 1 Add scheduled PPI BID  Ensure sits up  right for at least 45 minutes post meal Repeat CXR am  PT cont to work w/ her   Pulmonary nodules Plan Prob need to consider PET at some point  Best Practice (right click and "Reselect all SmartList Selections" daily)   Per primary  Labs   CBC: Recent Labs  Lab 12/04/22 1553 12/05/22 0500 12/06/22 0432 12/06/22 1700 12/08/22 0921 12/09/22 0408 12/10/22 0419  WBC 3.4* 2.7* 3.6*  --  3.1* 3.1* 2.5*   NEUTROABS 2.5  --   --   --   --   --  1.7  HGB 8.3* 7.6* 6.5* 9.3* 10.2* 8.7* 8.3*  HCT 25.2* 22.7* 19.3* 28.0* 31.0* 26.8* 25.7*  MCV 107.2* 104.6* 105.5*  --  104.4* 103.5* 106.6*  PLT 185 168 147*  --  133* 120* 111*    Basic Metabolic Panel: Recent Labs  Lab 12/04/22 2050 12/05/22 0500 12/05/22 1626 12/06/22 0432 12/06/22 1700 12/08/22 0921 12/09/22 0408 12/10/22 0414 12/10/22 0419  NA 143   < > 140 139 145 143 137  --  136  K 2.9*   < > 3.5 3.0* 3.2* 3.8 4.4  --  4.3  CL 114*   < > 104 99 101 100 102  --  101  CO2 16*   < > 24 29 28 30 28   --  25  GLUCOSE 112*   < > 170* 142* 114* 142* 103*  --  109*  BUN 19   < > 14 15 13 15 17   --  22  CREATININE 1.22*   < > 1.13* 1.30* 1.43* 1.68* 1.74*  --  1.77*  CALCIUM 6.2*   < > 6.5* 6.0* 6.6* 6.9* 7.1*  --  7.4*  MG <0.5*   < > 1.9 1.7  --  1.2* 1.5* 1.9  --   PHOS 3.9  --   --   --   --   --   --   --   --    < > = values in this interval not displayed.   GFR: Estimated Creatinine Clearance: 22.2 mL/min (A) (by C-G formula based on SCr of 1.77 mg/dL (H)). Recent Labs  Lab 12/04/22 1609 12/04/22 1745 12/04/22 2050 12/05/22 0500 12/06/22 0432 12/08/22 0921 12/09/22 0408 12/10/22 0419  PROCALCITON  --   --  <0.10  --   --   --   --   --   WBC  --   --   --    < > 3.6* 3.1* 3.1* 2.5*  LATICACIDVEN 1.1 0.6  --   --   --   --   --   --    < > = values in this interval not displayed.    Liver Function Tests: Recent Labs  Lab 12/05/22 1626 12/06/22 0432 12/06/22 1700 12/08/22 0921 12/09/22 0408  AST 18 21 22 21 17   ALT 11 11 13 13 11   ALKPHOS 45 39 42 50 40  BILITOT 0.3 0.6 0.6 0.7 0.5  PROT 5.9* 5.3* 5.7* 6.2* 5.3*  ALBUMIN 2.7* 2.4* 2.5* 2.7* 2.3*   No results for input(s): "LIPASE", "AMYLASE" in the last 168 hours. No results for input(s): "AMMONIA" in the last 168 hours.  ABG    Component Value Date/Time   PHART 7.45 12/04/2022 2056   PCO2ART 19 (LL) 12/04/2022 2056   PO2ART 91 12/04/2022 2056    HCO3 12.9 (L) 12/04/2022 2056   ACIDBASEDEF 8.8 (H) 12/04/2022 2056   O2SAT 100 12/04/2022 2056  Coagulation Profile: Recent Labs  Lab 12/05/22 0500  INR 3.2*    Cardiac Enzymes: No results for input(s): "CKTOTAL", "CKMB", "CKMBINDEX", "TROPONINI" in the last 168 hours.  HbA1C: Hgb A1c MFr Bld  Date/Time Value Ref Range Status  07/13/2018 09:38 AM 6.6 (H) 4.6 - 6.5 % Final    Comment:    Glycemic Control Guidelines for People with Diabetes:Non Diabetic:  <6%Goal of Therapy: <7%Additional Action Suggested:  >8%     CBG: No results for input(s): "GLUCAP" in the last 168 hours.  Review of Systems:   Review of Systems  Constitutional:  Positive for malaise/fatigue.  HENT: Negative.    Eyes: Negative.   Respiratory:  Positive for cough and shortness of breath. Negative for sputum production.   Cardiovascular:  Positive for orthopnea and leg swelling. Negative for chest pain.  Gastrointestinal:  Positive for heartburn and nausea.  Genitourinary: Negative.   Musculoskeletal: Negative.   Skin: Negative.   Neurological: Negative.   Endo/Heme/Allergies: Negative.      Past Medical History:  She,  has a past medical history of ANEMIA-NOS, ANXIETY, Breast cancer (HCC) (1997 L, 2012 R), COPD, DIVERTICULOSIS, COLON (2008), Dizziness, Family history of breast cancer, Family history of lung cancer, Family history of lymphoma, Family history of pancreatic cancer, Family history of uterine cancer, GERD, radiation therapy (10/19/11 -12/03/11), HYPERLIPIDEMIA, IRRITABLE BOWEL SYNDROME, HX OF, Left-sided carotid artery disease (HCC), OSTEOARTHRITIS, HAND, and PSVT (paroxysmal supraventricular tachycardia) (HCC).   Surgical History:   Past Surgical History:  Procedure Laterality Date   ABDOMINAL HYSTERECTOMY     APPENDECTOMY     BREAST BIOPSY  11/14/10    r breast: inv, insitu mammary carcinoma w/calcif, er/pr +, her2 -   BREAST SURGERY     lumpectomy   CATARACT EXTRACTION      both eyes   ELECTROPHYSIOLOGIC STUDY N/A 05/10/2015   Procedure: SVT Ablation;  Surgeon: Will Jorja Loa, MD;  Location: [facility];  Service: Cardiovascular;  Laterality: N/A;   HERNIA REPAIR     inguinal herniorrhapy  1984   left   IR US GUIDE BX ASP/DRAIN  05/26/2019   rectal fissure repair     s/p benign breast biopsy  2003   right   s/p left foot surgury  2009   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 Her smoking use included cigarettes. She started smoking about 60 years ago. She has a 54 pack-year smoking history. She has never used smokeless tobacco. She reports current alcohol use. She reports that she does not use drugs.   Family History:  Her family history includes Brain cancer in her cousin; Breast cancer in her cousin, cousin, and cousin; Colon polyps in her mother; Diabetes in her mother; Hypertension in her mother; Lung cancer in her maternal grandfather and paternal uncle; Lung cancer (age of onset: 16) in her maternal grandmother; Lymphoma in her brother; Pancreatic cancer (age of onset: 60) in her mother; Stroke in her mother; Testicular cancer in her cousin; Uterine cancer (age of onset: 70) in her mother. There is no history of Colon cancer, Esophageal cancer, Stomach cancer, or Rectal cancer.   Allergies Allergies  Allergen Reactions   Other Nausea Only and Other (See Comments)    A lot of antibiotics cause extreme nausea   Tape Other (See Comments)    PAPER TAPE IS MUCH PREFERRED   Aminoglycosides Other (See Comments)    Unknown reaction - pt is not sure where this entry came from   Bacitracin Other (See Comments)    Reaction not recalled   Bee Venom Swelling and Other (See Comments)    Severe body swelling   Cephalexin Nausea Only    Clindamycin/Lincomycin Diarrhea and Nausea And Vomiting   Codeine Nausea And Vomiting and Other (See Comments)    Pt can take codeine cough syrup     Home Medications  Prior to Admission medications   Medication Sig Start Date End Date Taking? Authorizing Provider  acetaminophen (TYLENOL) 500 MG tablet Take 1,000 mg by mouth every 8 (eight) hours as needed for mild pain (pain score 1-3), headache or fever.   Yes [provider]  aspirin 81 MG EC tablet Take 81 mg by mouth every morning.   Yes [provider]  CALCIUM CARBONATE-VITAMIN D PO Take 1 tablet by mouth 2 (two) times daily.   Yes [provider]  Cholecalciferol (VITAMIN D3 PO) Take 1 capsule by mouth every morning.   Yes [provider]  diphenhydrAMINE (BENADRYL) 25 MG tablet Take 25 mg by mouth every 6 (six) hours as needed (for bee stings).    Yes [provider]  fulvestrant (FASLODEX) 250 MG/5ML injection Inject 250 mg into the muscle every 28 (twenty-eight) days. 11/23/19  Yes [provider]  loperamide (IMODIUM) 2 MG capsule Take 2-4 mg by mouth See admin instructions. Take 4 mg by mouth after the first loose stool, then 2 mg for each subsequent one. Cannot exceed a sum total of 16 mg/24 hours. Stop if there are no loose stools after 12 hours. 09/27/20  Yes Magrinat, Valentino Hue, MD  loratadine (CLARITIN) 10 MG tablet Take 10 mg by mouth every morning.   Yes [provider]  Multiple Vitamin (MULTIVITAMIN WITH MINERALS) TABS tablet Take 1 tablet by mouth every morning. Centrum   Yes [provider]  omeprazole (PRILOSEC) 20 MG capsule Take 1 capsule by mouth daily Patient taking differently: Take 20 mg by mouth daily before breakfast. Take 1 capsule by mouth daily 09/14/22  Yes [name], MD  palbociclib Ilda Foil) 100 MG tablet Take 1 tablet (100 mg total) by mouth Monday through Friday. Take as directed by MD. Patient taking differently: Take 100 mg by mouth  See admin instructions. Take 100 mg by mouth Monday through Friday for 3 consecutive weeks, then OFF for 1 week. Repeat. Take as directed by MD. 03/11/22  Yes Rachel Moulds, MD  telmisartan (MICARDIS) 40 MG tablet Take 1 tablet (40 mg total) by mouth every morning. 09/14/22  Yes [name], MD  triamcinolone (NASACORT) 55 MCG/ACT AERO nasal inhaler Place 2 sprays into the nose daily. 2 sprays each nostril at night before bedtime Patient taking differently: Place 2 sprays into the nose See admin instructions. Instill 2 sprays into each nostril every night before bedtime 10/14/20  Yes Drema Halon, MD  vitamin B-12 (CYANOCOBALAMIN) 100 MCG tablet Take 100 mcg by mouth daily.   Yes [provider]  zolpidem (AMBIEN) 10 MG tablet Take 1 tablet (10 mg total) by mouth at bedtime as needed for sleep. Patient taking differently: Take 5-10 mg by mouth at bedtime as needed for  sleep. 10/07/22  Yes [name], MD  anastrozole (ARIMIDEX) 1 MG tablet TAKE 1 TABLET(1 MG) BY MOUTH DAILY Patient taking differently: Take 1 mg by mouth daily. 09/21/22   Loa Socks, NP  dexamethasone (DECADRON) 4 MG tablet Take 0.5 tablets (2 mg total) by mouth daily. Patient not taking: Reported on 12/04/2022 10/23/22   Loa Socks, NP  Magnesium 500 MG CAPS Take 500 mg by mouth every morning. Take with a 250 mg capsule for a total dose of 750 mg daily Patient not taking: Reported on 12/04/2022    [provider]  MAGNESIUM PO Take 250 mg by mouth every morning. Take with a 500 mg capsule for a total dose of 750 mg daily Patient not taking: Reported on 12/04/2022    [provider]  prochlorperazine (COMPAZINE) 10 MG tablet Take 1 tablet (10 mg total) by mouth every 6 (six) hours as needed for nausea or vomiting. Patient not taking: Reported on 12/04/2022 06/26/19   Magrinat, Valentino Hue, MD  rivaroxaban (XARELTO) 20 MG TABS tablet Take 1 tablet (20 mg total) by mouth daily with  supper. Patient not taking: Reported on 12/04/2022 05/13/22   Loa Socks, NP     Critical care time: NA

## 2022-12-10 NOTE — Progress Notes (Signed)
PROGRESS NOTE    Stacey [name]  ZOX:[id number] DOB: Sep 02, 1940 DOA: 12/04/2022 PCP: [name], MD   Brief Narrative:  82 y.o. female with medical history significant of metastaic breast ca for which patient is on  ibrance. Patient was discharged from the [facility] health system on October 18, approximately 2 weeks ago after treatment for sepsis secondary to pneumonia that was present on admission affecting the right lower lobe. Overall she received 7 days of antibiotics.  She initially felt better but then she began to have shortness of breath generalized weakness. Patient does have chronic kidney disease stage IIIb and also has chronic diarrhea that has been attributed to [place] use for which she takes Imodium at home.   Today, still requiring about 5L of O2 this am. Noted to be very dyspneic/deconditioned.     Assessment & Plan:   Principal Problem:   Pneumonia Active Problems:   Anemia   HTN (hypertension)   Recurrent cancer of left breast (HCC)   Metabolic acidosis   Hypocalcemia   Fatigue   Immunosuppression due to chronic steroid use (HCC)   DVT (deep venous thrombosis) (HCC)   Diarrhea   Acute respiratory failure with hypoxia (HCC)  Acute hypoxic respiratory failure secondary to ?HCAP Mucous plugging/recurrent aspiration Currently on 5L of O2 Completed a course of IV antibiotics 5 days duration on 12/08/2022. Repeat chest x-ray on 12/08/2022 with development of small bilateral pleural effusions and bibasilar airspace disease, most likely atelectasis. Infection or aspiration could look similar. CTA chest no pulmonary embolism.  Mild bilateral bronchial wall thickening consistent with bronchitis.  New clustered solid nodule seen in the lingula and unchanged clustered nodules and mucous plugging of the right lower lobe likely due to infection or aspiration.  Respiratory virus panel negative.  COVID negative. Flu was neg -2 weeks ago. Continue IS/Flutter valve/nebs/hypertonic  saline PCCM consulted, appreciate recs PT/OT  Chronic distolic HF BNP 164 ECHO showed EF of 60-65%, grade 1 DD PCCM gave 1 dose of lasix on 11/7  History of DVT Continue Xarelto  Chronic diarrhea  Secondary to Ibrance in addition to recent antibiotic use and multiple bowel movements Stool C. Difficile negative   Multiple electrolyte abnormalities  Hypomagnesemia/hypocalcemia Replace prn  AKI on CKD stage IIIb Likely 2/2 acute urinary retention- improving Bladder scan with noted retention, in and out X 2 on 11/5 and 11/6 Renal ultrasound unremarkable Daily BMP  History of hypertension Not on any antihypertensives prior to admission however her blood pressure has been running soft Started on midodrine   Acute on chronic anemia of chronic disease/ malignancy Pancytopenia Hemoglobin dropped to 6.5 from 7.6 with no evidence of bleeding S/p 1 unit of prbc.  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  Severe anxiety  Does not take anything at home, was started on Xanax as needed with Atarax.      Estimated body mass index is 25.38 kg/m as calculated from the following:   Height as of this encounter: 5\' 3"  (1.6 m).   Weight as of this encounter: 65 kg.  DVT prophylaxis: Xarelto code Status: Full code Family Communication: None at bedside Disposition Plan:  Status is: Inpatient Remains inpatient appropriate because: Acute hypoxia   Consultants:  PCCM  Procedures: None  Antimicrobials: Completed   Objective: Vitals:   12/09/22 2027 12/10/22 0340 12/10/22 0738 12/10/22 1246  BP: (!) 132/57 (!) 144/66  113/66  Pulse: 90 95  95  Resp: 20 (!) 24    Temp: 98.9 F (37.2 C) 98 F (36.7 C)  98.7 F (37.1 C)  TempSrc: Oral Oral  Oral  SpO2: (!) 85% 96% 91%   Weight:      Height:        Intake/Output Summary (Last 24 hours) at 12/10/2022 1404 Last data filed at 12/10/2022 0809 Gross  per 24 hour  Intake 50 ml  Output 1350 ml  Net -1300 ml   Filed Weights   12/05/22 1101 12/05/22 1245 12/06/22 0524  Weight: 61.2 kg 64.2 kg 65 kg    Examination: General: NAD  Cardiovascular: S1, S2 present Respiratory: CTAB Abdomen: Soft, nontender, nondistended, bowel sounds present Musculoskeletal: RLE edema > than LLE Skin: Normal Psychiatry: Anxious mood    Data Reviewed: I have personally reviewed following labs and imaging studies  CBC: Recent Labs  Lab 12/04/22 1553 12/05/22 0500 12/06/22 0432 12/06/22 1700 12/08/22 0921 12/09/22 0408 12/10/22 0419  WBC 3.4* 2.7* 3.6*  --  3.1* 3.1* 2.5*  NEUTROABS 2.5  --   --   --   --   --  1.7  HGB 8.3* 7.6* 6.5* 9.3* 10.2* 8.7* 8.3*  HCT 25.2* 22.7* 19.3* 28.0* 31.0* 26.8* 25.7*  MCV 107.2* 104.6* 105.5*  --  104.4* 103.5* 106.6*  PLT 185 168 147*  --  133* 120* 111*   Basic Metabolic Panel: Recent Labs  Lab 12/04/22 2050 12/05/22 0500 12/05/22 1626 12/06/22 0432 12/06/22 1700 12/08/22 0921 12/09/22 0408 12/10/22 0414 12/10/22 0419  NA 143   < > 140 139 145 143 137  --  136  K 2.9*   < > 3.5 3.0* 3.2* 3.8 4.4  --  4.3  CL 114*   < > 104 99 101 100 102  --  101  CO2 16*   < > 24 29 28 30 28   --  25  GLUCOSE 112*   < > 170* 142* 114* 142* 103*  --  109*  BUN 19   < > 14 15 13 15 17   --  22  CREATININE 1.22*   < > 1.13* 1.30* 1.43* 1.68* 1.74*  --  1.77*  CALCIUM 6.2*   < > 6.5* 6.0* 6.6* 6.9* 7.1*  --  7.4*  MG <0.5*   < > 1.9 1.7  --  1.2* 1.5* 1.9  --   PHOS 3.9  --   --   --   --   --   --   --   --    < > = values in this interval not displayed.   GFR: Estimated Creatinine Clearance: 22.2 mL/min (A) (by C-G formula based on SCr of 1.77 mg/dL (H)). Liver Function Tests: Recent Labs  Lab 12/05/22 1626 12/06/22 0432 12/06/22 1700 12/08/22 0921 12/09/22 0408  AST 18 21 22 21 17   ALT 11 11 13 13 11   ALKPHOS 45 39 42 50 40  BILITOT 0.3 0.6 0.6 0.7 0.5  PROT 5.9* 5.3* 5.7* 6.2* 5.3*  ALBUMIN 2.7*  2.4* 2.5* 2.7* 2.3*   No results for input(s): "LIPASE", "AMYLASE" in the last 168 hours. No results for input(s): "AMMONIA" in the last 168 hours. Coagulation Profile: Recent Labs  Lab 12/05/22 0500  INR 3.2*   Cardiac Enzymes: No results for input(s): "CKTOTAL", "CKMB", "CKMBINDEX", "TROPONINI" in the last 168 hours. BNP (last 3 results) No results for input(s): "PROBNP" in the last 8760 hours. HbA1C: No results for input(s): "HGBA1C" in the last 72  hours. CBG: No results for input(s): "GLUCAP" in the last 168 hours. Lipid Profile: No results for input(s): "CHOL", "HDL", "LDLCALC", "TRIG", "CHOLHDL", "LDLDIRECT" in the last 72 hours. Thyroid Function Tests: No results for input(s): "TSH", "T4TOTAL", "FREET4", "T3FREE", "THYROIDAB" in the last 72 hours.  Anemia Panel: No results for input(s): "VITAMINB12", "FOLATE", "FERRITIN", "TIBC", "IRON", "RETICCTPCT" in the last 72 hours.  Sepsis Labs: Recent Labs  Lab 12/04/22 1609 12/04/22 1745 12/04/22 2050  PROCALCITON  --   --  <0.10  LATICACIDVEN 1.1 0.6  --     Recent Results (from the past 240 hour(s))  Culture, blood (routine x 2)     Status: None   Collection Time: 12/04/22  3:42 PM   Specimen: BLOOD  Result Value Ref Range Status   Specimen Description   Final    BLOOD RIGHT ANTECUBITAL Performed at [facility], [address], [place], [place] [address]    Special Requests   Final    BOTTLES DRAWN AEROBIC AND ANAEROBIC Blood Culture adequate volume Performed at [facility], [address], [place], [place] [address]    Culture   Final    NO GROWTH 5 DAYS Performed at [facility], [address], [place], [place] [address]    Report Status 12/09/2022 FINAL  Final  Culture, blood (routine x 2)     Status: None   Collection Time: 12/04/22  4:39 PM   Specimen: BLOOD RIGHT HAND  Result Value Ref Range Status   Specimen Description   Final    BLOOD RIGHT  HAND Performed at [facility], [address], [place], [place] [address]    Special Requests   Final    BOTTLES DRAWN AEROBIC ONLY Blood Culture results may not be optimal due to an inadequate volume of blood received in culture bottles Performed at [facility], [address] [place], [place] [address]    Culture   Final    NO GROWTH 5 DAYS Performed at [facility], [address] [place], [place] [address]    Report Status 12/09/2022 FINAL  Final  SARS Coronavirus 2 by RT PCR (hospital order, performed in [facility] hospital lab) *cepheid single result test* Anterior Nasal Swab     Status: None   Collection Time: 12/04/22  9:37 PM   Specimen: Anterior Nasal Swab  Result Value Ref Range Status   SARS Coronavirus 2 by RT PCR NEGATIVE NEGATIVE Final    Comment: (NOTE) SARS-CoV-2 target nucleic acids are NOT DETECTED.  The SARS-CoV-2 RNA is generally detectable in upper and lower respiratory specimens during the acute phase of infection. The lowest concentration of SARS-CoV-2 viral copies this assay can detect is 250 copies / mL. A negative result does not preclude SARS-CoV-2 infection and should not be used as the sole basis for treatment or other patient management decisions.  A negative result may occur with improper specimen collection / handling, submission of specimen other than nasopharyngeal swab, presence of viral mutation(s) within the areas targeted by this assay, and inadequate number of viral copies (<250 copies / mL). A negative result must be combined with clinical observations, patient history, and epidemiological information.  Fact Sheet for Patients:   RoadLapTop.co.za  Fact Sheet for Healthcare Providers: http://kim-miller.com/  This test is not yet approved or  cleared by the [place] FDA and has been authorized for detection and/or diagnosis of SARS-CoV-2 by FDA under an  Emergency Use  Authorization (EUA).  This EUA will remain in effect (meaning this test can be used) for the duration of the COVID-19 declaration under Section 564(b)(1) of the Act, 21 U.S.C. section 360bbb-3(b)(1), unless the authorization is terminated or revoked sooner.  Performed at [facility], [address], [place], [place] [address]   Respiratory (~20 pathogens) panel by PCR     Status: None   Collection Time: 12/04/22  9:37 PM   Specimen: Anterior Nasal Swab; Respiratory  Result Value Ref Range Status   Adenovirus NOT DETECTED NOT DETECTED Final   Coronavirus 229E NOT DETECTED NOT DETECTED Final    Comment: (NOTE) The Coronavirus on the Respiratory Panel, DOES NOT test for the novel  Coronavirus (2019 nCoV)    Coronavirus HKU1 NOT DETECTED NOT DETECTED Final   Coronavirus NL63 NOT DETECTED NOT DETECTED Final   Coronavirus OC43 NOT DETECTED NOT DETECTED Final   Metapneumovirus NOT DETECTED NOT DETECTED Final   Rhinovirus / Enterovirus NOT DETECTED NOT DETECTED Final   Influenza A NOT DETECTED NOT DETECTED Final   Influenza B NOT DETECTED NOT DETECTED Final   Parainfluenza Virus 1 NOT DETECTED NOT DETECTED Final   Parainfluenza Virus 2 NOT DETECTED NOT DETECTED Final   Parainfluenza Virus 3 NOT DETECTED NOT DETECTED Final   Parainfluenza Virus 4 NOT DETECTED NOT DETECTED Final   Respiratory Syncytial Virus NOT DETECTED NOT DETECTED Final   Bordetella pertussis NOT DETECTED NOT DETECTED Final   Bordetella Parapertussis NOT DETECTED NOT DETECTED Final   Chlamydophila pneumoniae NOT DETECTED NOT DETECTED Final   Mycoplasma pneumoniae NOT DETECTED NOT DETECTED Final    Comment: Performed at [facility], [address] [place], [place] [address]  C Difficile Quick Screen w PCR reflex     Status: None   Collection Time: 12/05/22 10:17 PM  Result Value Ref Range Status   C Diff antigen NEGATIVE NEGATIVE Final   C Diff toxin NEGATIVE NEGATIVE Final    C Diff interpretation No C. difficile detected.  Final    Comment: Performed at [facility], [address] [place], [place] [address]     Radiology Studies: US RENAL  Result Date: 12/08/2022 CLINICAL DATA:  Acute kidney injury. EXAM: RENAL / URINARY TRACT ULTRASOUND COMPLETE COMPARISON:  None Available. FINDINGS: Right Kidney: Renal measurements: 8.4 cm x 4.7 cm x 4.1 cm = volume: 84.5 mL. Echogenicity within normal limits. No mass or hydronephrosis visualized. Left Kidney: Renal measurements: 8.5 cm x 4.4 cm x 4.9 cm = volume: 95.1 mL. Echogenicity within normal limits. No mass or hydronephrosis visualized. Bladder: Appears normal for degree of bladder distention. The bilateral ureteral jets are not visualized. Other: Of incidental note is the presence of a 1.5 cm x 1.2 cm x 1.6 cm cystic appearing structure within the posterior aspect of the right lobe of the liver. No flow is seen within this region on color Doppler evaluation. IMPRESSION: 1. Unremarkable renal ultrasound. 2. Simple hepatic cyst. Electronically Signed   By: Aram Candela M.D.   On: 12/08/2022 19:57   DG Chest 1 View  Result Date: 12/08/2022 CLINICAL DATA:  Cough EXAM: CHEST  1 VIEW COMPARISON:  12/04/2022 plain film and CT FINDINGS: Chin overlies the apices minimally. Midline trachea. Cardiomegaly accentuated by AP portable technique. Small bilateral pleural effusions are new. No pneumothorax. Bibasilar airspace disease. IMPRESSION: Development of small bilateral pleural effusions and bibasilar airspace disease, most likely atelectasis. Infection or aspiration could look similar. Aortic Atherosclerosis (ICD10-I70.0).  Electronically Signed   By: Jeronimo Greaves M.D.   On: 12/08/2022 18:05     Scheduled Meds:  anastrozole  1 mg Oral Daily   arformoterol  15 mcg Nebulization BID   aspirin  81 mg Oral Daily   budesonide (PULMICORT) nebulizer solution  0.25 mg Nebulization BID   dextromethorphan-guaiFENesin  1  tablet Oral BID   hydrOXYzine  10 mg Oral TID   influenza vaccine adjuvanted  0.5 mL Intramuscular Tomorrow-1000   ipratropium-albuterol  3 mL Nebulization Q6H   midodrine  2.5 mg Oral TID WC   pantoprazole  40 mg Oral BID   rivaroxaban  20 mg Oral Q supper   sodium chloride flush  3 mL Intravenous Q12H   sodium chloride HYPERTONIC  4 mL Nebulization BID   Continuous Infusions:     LOS: 6 days      Briant Cedar, MD  12/10/2022, 2:04 PM

## 2022-12-10 NOTE — Plan of Care (Signed)
  Problem: Elimination: Goal: Will not experience complications related to bowel motility Outcome: Progressing   Problem: Safety: Goal: Ability to remain free from injury will improve Outcome: Progressing   Problem: Skin Integrity: Goal: Risk for impaired skin integrity will decrease Outcome: Progressing   Problem: Respiratory: Goal: Ability to maintain adequate ventilation will improve Outcome: Progressing

## 2022-12-11 ENCOUNTER — Inpatient Hospital Stay (HOSPITAL_COMMUNITY): Payer: Medicare Other

## 2022-12-11 DIAGNOSIS — J690 Pneumonitis due to inhalation of food and vomit: Secondary | ICD-10-CM | POA: Diagnosis not present

## 2022-12-11 DIAGNOSIS — J9601 Acute respiratory failure with hypoxia: Secondary | ICD-10-CM | POA: Diagnosis not present

## 2022-12-11 LAB — CBC WITH DIFFERENTIAL/PLATELET
Abs Immature Granulocytes: 0.01 K/uL (ref 0.00–0.07)
Basophils Absolute: 0 K/uL (ref 0.0–0.1)
Basophils Relative: 1 %
Eosinophils Absolute: 0.1 K/uL (ref 0.0–0.5)
Eosinophils Relative: 2 %
HCT: 22.2 % — ABNORMAL LOW (ref 36.0–46.0)
Hemoglobin: 7 g/dL — ABNORMAL LOW (ref 12.0–15.0)
Immature Granulocytes: 0 %
Lymphocytes Relative: 20 %
Lymphs Abs: 0.5 K/uL — ABNORMAL LOW (ref 0.7–4.0)
MCH: 33.5 pg (ref 26.0–34.0)
MCHC: 31.5 g/dL (ref 30.0–36.0)
MCV: 106.2 fL — ABNORMAL HIGH (ref 80.0–100.0)
Monocytes Absolute: 0.2 K/uL (ref 0.1–1.0)
Monocytes Relative: 9 %
Neutro Abs: 1.7 K/uL (ref 1.7–7.7)
Neutrophils Relative %: 68 %
Platelets: 119 K/uL — ABNORMAL LOW (ref 150–400)
RBC: 2.09 MIL/uL — ABNORMAL LOW (ref 3.87–5.11)
RDW: 17 % — ABNORMAL HIGH (ref 11.5–15.5)
WBC: 2.6 K/uL — ABNORMAL LOW (ref 4.0–10.5)
nRBC: 0 % (ref 0.0–0.2)

## 2022-12-11 LAB — BASIC METABOLIC PANEL WITH GFR
Anion gap: 9 (ref 5–15)
BUN: 23 mg/dL (ref 8–23)
CO2: 28 mmol/L (ref 22–32)
Calcium: 8.1 mg/dL — ABNORMAL LOW (ref 8.9–10.3)
Chloride: 101 mmol/L (ref 98–111)
Creatinine, Ser: 1.99 mg/dL — ABNORMAL HIGH (ref 0.44–1.00)
GFR, Estimated: 25 mL/min — ABNORMAL LOW
Glucose, Bld: 104 mg/dL — ABNORMAL HIGH (ref 70–99)
Potassium: 3.9 mmol/L (ref 3.5–5.1)
Sodium: 138 mmol/L (ref 135–145)

## 2022-12-11 LAB — HEMOGLOBIN AND HEMATOCRIT, BLOOD
HCT: 22.8 % — ABNORMAL LOW (ref 36.0–46.0)
Hemoglobin: 7.2 g/dL — ABNORMAL LOW (ref 12.0–15.0)

## 2022-12-11 MED ORDER — POTASSIUM CHLORIDE CRYS ER 20 MEQ PO TBCR
30.0000 meq | EXTENDED_RELEASE_TABLET | Freq: Two times a day (BID) | ORAL | Status: AC
  Administered 2022-12-11 (×2): 30 meq via ORAL
  Filled 2022-12-11 (×2): qty 1

## 2022-12-11 MED ORDER — FUROSEMIDE 10 MG/ML IJ SOLN
40.0000 mg | Freq: Four times a day (QID) | INTRAMUSCULAR | Status: AC
  Administered 2022-12-11 (×2): 40 mg via INTRAVENOUS
  Filled 2022-12-11 (×2): qty 4

## 2022-12-11 NOTE — Progress Notes (Addendum)
Occupational Therapy Treatment Patient Details Name: Stacey [name] MRN: [id number] DOB: [birth year] Today's Date: 12/11/2022   History of present illness Pt is an 82 yo female with recent admission on 11/17/22 for sepsis, and pt discharged home.  Pt admitted again on 12/04/22 for acute hypoxic respiratory failure secondary to aspiration pneumonia.  Pt with hx including but not limited to breast CA with bone mets on chronic immunotherapy, L UE lymphedema, OA, DVT   OT comments  Pt. Seen for skilled OT treatment session.  Pt. Able to complete bed mobility and stand pivot to bsc with cga/min a and cues for sequencing and energy conservation.  Pt. With good return demo of PLB but required cues to initiate and cont. It without talking during breathing strategies for effective rest break.  Cont. With acute ot poc.       If plan is discharge home, recommend the following:  A little help with walking and/or transfers;Assistance with cooking/housework;Help with stairs or ramp for entrance;Assist for transportation;A little help with bathing/dressing/bathroom   Equipment Recommendations  None recommended by OT    Recommendations for Other Services      Precautions / Restrictions Precautions Precautions: Fall Precaution Comments: monitor sats, chronic diarrhea, urinary urgency Restrictions Weight Bearing Restrictions: No       Mobility Bed Mobility Overal bed mobility: Needs Assistance Bed Mobility: Supine to Sit, Sit to Supine     Supine to sit: Contact guard, HOB elevated, Used rails Sit to supine: Contact guard assist, HOB elevated, Used rails   General bed mobility comments: pt. able to manage bles in bed when returning to bed without assistance    Transfers Overall transfer level: Needs assistance Equipment used: 1 person hand held assist Transfers: Bed to chair/wheelchair/BSC Sit to Stand: Min assist, Contact guard assist   Squat pivot transfers: Contact guard assist, Min  assist       General transfer comment: cues for hand placement during pivot, education on breathing strategies for when noted sob and also for anxiety management when she is reportedly feeling frustrated during tasks and when she is starting to get tired     Balance                                           ADL either performed or assessed with clinical judgement   ADL Overall ADL's : Needs assistance/impaired                         Toilet Transfer: Minimal assistance;Contact guard assist;Stand-pivot;BSC/3in1;Cueing for sequencing;Cueing for safety Toilet Transfer Details (indicate cue type and reason): cues for hand placement for safe pivot to eliminate extra turning or steps Toileting- Clothing Manipulation and Hygiene: Set up;Sitting/lateral lean;Sit to/from stand;Contact guard assist Toileting - Clothing Manipulation Details (indicate cue type and reason): pt. able to pull underwear up/down for b.room use.  able to adjust and place feminine pad in underwear Lower body dressing: reviewed safe tech for lb dressing.  Pt. Able to reach bles in long sitting. Reminded not to bend forward seated eob for safety precaution     Functional mobility during ADLs: Contact guard assist;Minimal assistance General ADL Comments: cues for hand placement for pivot that allow direct pivot, education on breathing strategies and promoting of rest breaks when noted to be feeling sob    Extremity/Trunk Assessment  Vision       Perception     Praxis      Cognition Arousal: Alert Behavior During Therapy: WFL for tasks assessed/performed                                   General Comments: reports feeling "irritated" because she is tired and wants a nap-rn notified to put in place limited interupritons/visitors this am        Exercises      Shoulder Instructions       General Comments      Pertinent Vitals/ Pain       Pain  Assessment Pain Assessment: No/denies pain  Home Living                                          Prior Functioning/Environment              Frequency  Min 1X/week        Progress Toward Goals  OT Goals(current goals can now be found in the care plan section)  Progress towards OT goals: Progressing toward goals     Plan      Co-evaluation                 AM-PAC OT "6 Clicks" Daily Activity     Outcome Measure   Help from another person eating meals?: None Help from another person taking care of personal grooming?: A Little Help from another person toileting, which includes using toliet, bedpan, or urinal?: A Lot Help from another person bathing (including washing, rinsing, drying)?: A Lot Help from another person to put on and taking off regular upper body clothing?: A Little Help from another person to put on and taking off regular lower body clothing?: A Lot 6 Click Score: 16    End of Session Equipment Utilized During Treatment: Oxygen  OT Visit Diagnosis: Muscle weakness (generalized) (M62.81);Unsteadiness on feet (R26.81)   Activity Tolerance Patient tolerated treatment well   Patient Left in bed;with call bell/phone within reach;with bed alarm set   Nurse Communication Other (comment) (spoke with rn that pt. requesting not to be bothered so she can take a nap)        Time: 1610-9604 OT Time Calculation (min): 31 min  Charges: OT General Charges $OT Visit: 1 Visit OT Treatments $Self Care/Home Management : 23-37 mins  Boneta Lucks, COTA/L Acute Rehabilitation [phone]   Alessandra Bevels Lorraine-COTA/L 12/11/2022, 12:40 PM

## 2022-12-11 NOTE — Progress Notes (Signed)
NAME:  Stacey [name], MRN:  [id number], DOB:  Mar 10, 1940, LOS: 7 ADMISSION DATE:  12/04/2022, CONSULTATION DATE:  11/7 REFERRING MD:  [name], CHIEF COMPLAINT:  recurrent PNA/resp failure    History of Present Illness:  82 year old female w/ metastatic breast CA (stage IV LN and bone), currently on Ibrace M-F, fulvestrant q28d and arimidex  Recently dc'd from hospital 10/18 after being treated for PNA (NOS). Completed abx at home but still had some shortness of breath and fatigue (with mix of both progressing). She denied fever, chest pain, vomiting, syncope. Did endorse dry non-productive cough. Her weakness progressed to point she could not get OOB so EMS was called on 11/1. Initial CXR negative for acute disease. F/u Ct chest neg for PE but did show subpleural chronic changes, bilateral bronchial wall thickening. New solid nodules in lingula and mucous plugging or RLL. Also marked kyphosis, mod hiatal hernia and emphysema changes.  She was placed on supplemental oxygen, IV unasyn to cover for aspiration, and IV hydration. Since admit she completed 5 d of abx. Had a swallow eval which was neg for aspiration but continues to require supplemental oxygen. PCCM asked to see because of her recurrent shortness of breath and on-going O2 needs.   Pertinent  Medical History  Metastatic breast CA (stage IV LN and bone), currently on Ibrace M-F, fulvestrant q28d and arimidex  HFpEF H/o DVT Chronic diarrhea  CKD stage IIIb Chronic anemia  Severe anxiety  5 mm pulm nodule  Significant Hospital Events: Including procedures, antibiotic start and stop dates in addition to other pertinent events   11/1 admitted placed on abx 11/6 completed abx 11/7 pulm asked to see for on-going hypoxia   Interim History / Subjective:  Very weak no sig change   Objective   Blood pressure (!) 106/45, pulse 90, temperature 98.3 F (36.8 C), temperature source Oral, resp. rate 20, height 5\' 3"  (1.6 m), weight 65 kg, SpO2  92%.        Intake/Output Summary (Last 24 hours) at 12/11/2022 1006 Last data filed at 12/11/2022 0945 Gross per 24 hour  Intake 603 ml  Output 2720 ml  Net -2117 ml   Filed Weights   12/05/22 1101 12/05/22 1245 12/06/22 0524  Weight: 61.2 kg 64.2 kg 65 kg    Examination: General frail 82 year old female resting in bed no distress HENT NCAT  Pulm crackles in bases. No accessory use sats 90% on 1 LPM  POCUS left > right effusion (small to mod left, small on right)  Pcxr w/ bilateral R>L effusions/atx aeration a little worse  Card rrr Abd soft Ext warm + LE edema Neuro intact Resolved Hospital Problem list     Assessment & Plan:  Acute hypoxic respiratory failure  Kyphosis  Bilateral atelectasis  Mucous plugging  Hiatal hernia  Severe reflux Recurrent aspiration  Bilateral free flowing pleural effusions Pulmonary nodules Stage IV breast CA HFpEF and Grade I diastolic dysfxn H/o DVT Anemia  Thrombocytopenia  CKD stage IIIB  Pulm discussion  Acute hypoxic respiratory failure w/ on-going oxygen requirements due to: mucous plugging w/weak cough, recurrent aspiration due to Hiatal hernia and poorly controlled reflux, bilateral free flowing pleural effusions and complicated further by restrictive physiology from her kyphosis  -CXR actually a little worse. POCUS about same re: effusion L>R -scr bumped a little w/ diuresis  Plan Cont supplemental oxygen  Repeat lasix X 2 today  Cont IS and flutter while awake Cont HT Saline neb  and Cont  mucinex  Cont BDs Add scheduled PPI BID  Ensure sits up right for at least 45 minutes post meal Cont PT   Pulmonary nodules Plan Prob need to consider PET at some point  Best Practice (right click and "Reselect all SmartList Selections" daily)   Per primary   Critical care time: NA

## 2022-12-11 NOTE — Plan of Care (Signed)
  Problem: Respiratory: Goal: Ability to maintain adequate ventilation will improve Outcome: Progressing   Problem: Respiratory: Goal: Ability to maintain a clear airway will improve Outcome: Progressing   Problem: Safety: Goal: Ability to remain free from injury will improve Outcome: Progressing   Problem: Skin Integrity: Goal: Risk for impaired skin integrity will decrease Outcome: Progressing   Problem: Activity: Goal: Ability to tolerate increased activity will improve Outcome: Progressing   Problem: Education: Goal: Knowledge of General Education information will improve Description: Including pain rating scale, medication(s)/side effects and non-pharmacologic comfort measures Outcome: Progressing

## 2022-12-11 NOTE — Progress Notes (Signed)
PROGRESS NOTE    Stacey [name]  HQI:[id number] DOB: 1940-09-04 DOA: 12/04/2022 PCP: [name], MD   Brief Narrative:  82 y.o. female with medical history significant of metastaic breast ca for which patient is on  ibrance. Patient was discharged from the [facility] health system on October 18, approximately 2 weeks ago after treatment for sepsis secondary to pneumonia that was present on admission affecting the right lower lobe. Overall she received 7 days of antibiotics.  She initially felt better but then she began to have shortness of breath generalized weakness. Patient does have chronic kidney disease stage IIIb and also has chronic diarrhea that has been attributed to [place] use for which she takes Imodium at home.    Today, still SOB, needing supplemental O2. Diuresed with lasix but Cr rising.     Assessment & Plan:   Principal Problem:   Pneumonia Active Problems:   Anemia   HTN (hypertension)   Recurrent cancer of left breast (HCC)   Metabolic acidosis   Hypocalcemia   Fatigue   Immunosuppression due to chronic steroid use (HCC)   DVT (deep venous thrombosis) (HCC)   Diarrhea   Acute respiratory failure with hypoxia (HCC)   Acute hypoxic respiratory failure secondary to ?HCAP Mucous plugging/recurrent aspiration Currently on 5L of O2 Completed a course of IV antibiotics 5 days duration on 12/08/2022. Repeat chest x-ray on 12/08/2022 with development of small bilateral pleural effusions and bibasilar airspace disease, most likely atelectasis. Infection or aspiration could look similar. CTA chest no pulmonary embolism.  Mild bilateral bronchial wall thickening consistent with bronchitis.  New clustered solid nodule seen in the lingula and unchanged clustered nodules and mucous plugging of the right lower lobe likely due to infection or aspiration.  Respiratory virus panel negative.  COVID negative. Flu was neg -2 weeks ago. Continue IS/Flutter valve/nebs/hypertonic  saline PCCM consulted, appreciate recs PT/OT  Chronic distolic HF BNP 164 ECHO showed EF of 60-65%, grade 1 DD PCCM gave 1 dose of lasix on 11/7, cr rising  History of DVT Continue Xarelto  Chronic diarrhea  Secondary to Ibrance in addition to recent antibiotic use and multiple bowel movements Stool C. Difficile negative   Multiple electrolyte abnormalities  Hypomagnesemia/hypocalcemia Replace prn  AKI on CKD stage IIIb Noted acute urinary retention-appears to have resolved Bladder scan with noted retention, in and out X 2 on 11/5 and 11/6 Renal ultrasound unremarkable May need CT renal stone if continues to worsens Daily BMP  History of hypertension Not on any antihypertensives prior to admission however her blood pressure has been running soft Started on midodrine   Acute on chronic anemia of chronic disease/ malignancy Pancytopenia Hemoglobin dropped to 6.5 from 7.6 with no evidence of bleeding, S/p 1 unit of prbc Transfuse if hgb <7 Daily Cbc  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  Severe anxiety  Does not take anything at home, was started on Xanax as needed with Atarax.      Estimated body mass index is 25.38 kg/m as calculated from the following:   Height as of this encounter: 5\' 3"  (1.6 m).   Weight as of this encounter: 65 kg.  DVT prophylaxis: Xarelto code Status: Full code Family Communication: None at bedside Disposition Plan:  Status is: Inpatient Remains inpatient appropriate because: Acute hypoxia   Consultants:  PCCM  Procedures: None  Antimicrobials: Completed   Objective: Vitals:   12/11/22 0451 12/11/22 1324  12/11/22 0805 12/11/22 1219  BP: (!) 93/42 (!) 106/45  (!) 97/41  Pulse: 92 90  95  Resp: 20     Temp: 98.3 F (36.8 C)   98.9 F (37.2 C)  TempSrc: Oral   Oral  SpO2: 94%  92% 92%  Weight:      Height:        Intake/Output Summary  (Last 24 hours) at 12/11/2022 1614 Last data filed at 12/11/2022 1539 Gross per 24 hour  Intake 623 ml  Output 3520 ml  Net -2897 ml   Filed Weights   12/05/22 1101 12/05/22 1245 12/06/22 0524  Weight: 61.2 kg 64.2 kg 65 kg    Examination: General: NAD  Cardiovascular: S1, S2 present Respiratory: Diminished BS b/l Abdomen: Soft, nontender, nondistended, bowel sounds present Musculoskeletal: RLE edema > than LLE Skin: Normal Psychiatry: Normal mood    Data Reviewed: I have personally reviewed following labs and imaging studies  CBC: Recent Labs  Lab 12/06/22 0432 12/06/22 1700 12/08/22 0921 12/09/22 0408 12/10/22 0419 12/11/22 0418 12/11/22 1259  WBC 3.6*  --  3.1* 3.1* 2.5* 2.6*  --   NEUTROABS  --   --   --   --  1.7 1.7  --   HGB 6.5*   < > 10.2* 8.7* 8.3* 7.0* 7.2*  HCT 19.3*   < > 31.0* 26.8* 25.7* 22.2* 22.8*  MCV 105.5*  --  104.4* 103.5* 106.6* 106.2*  --   PLT 147*  --  133* 120* 111* 119*  --    < > = values in this interval not displayed.   Basic Metabolic Panel: Recent Labs  Lab 12/04/22 2050 12/05/22 0500 12/05/22 1626 12/06/22 0432 12/06/22 1700 12/08/22 0921 12/09/22 0408 12/10/22 0414 12/10/22 0419 12/11/22 0418  NA 143   < > 140 139 145 143 137  --  136 138  K 2.9*   < > 3.5 3.0* 3.2* 3.8 4.4  --  4.3 3.9  CL 114*   < > 104 99 101 100 102  --  101 101  CO2 16*   < > 24 29 28 30 28   --  25 28  GLUCOSE 112*   < > 170* 142* 114* 142* 103*  --  109* 104*  BUN 19   < > 14 15 13 15 17   --  22 23  CREATININE 1.22*   < > 1.13* 1.30* 1.43* 1.68* 1.74*  --  1.77* 1.99*  CALCIUM 6.2*   < > 6.5* 6.0* 6.6* 6.9* 7.1*  --  7.4* 8.1*  MG <0.5*   < > 1.9 1.7  --  1.2* 1.5* 1.9  --   --   PHOS 3.9  --   --   --   --   --   --   --   --   --    < > = values in this interval not displayed.   GFR: Estimated Creatinine Clearance: 19.8 mL/min (A) (by C-G formula based on SCr of 1.99 mg/dL (H)). Liver Function Tests: Recent Labs  Lab 12/05/22 1626  12/06/22 0432 12/06/22 1700 12/08/22 0921 12/09/22 0408  AST 18 21 22 21 17   ALT 11 11 13 13 11   ALKPHOS 45 39 42 50 40  BILITOT 0.3 0.6 0.6 0.7 0.5  PROT 5.9* 5.3* 5.7* 6.2* 5.3*  ALBUMIN 2.7* 2.4* 2.5* 2.7* 2.3*   No results for input(s): "LIPASE", "AMYLASE" in the last 168 hours. No results for input(s): "AMMONIA" in  the last 168 hours. Coagulation Profile: Recent Labs  Lab 12/05/22 0500  INR 3.2*   Cardiac Enzymes: No results for input(s): "CKTOTAL", "CKMB", "CKMBINDEX", "TROPONINI" in the last 168 hours. BNP (last 3 results) No results for input(s): "PROBNP" in the last 8760 hours. HbA1C: No results for input(s): "HGBA1C" in the last 72 hours. CBG: No results for input(s): "GLUCAP" in the last 168 hours. Lipid Profile: No results for input(s): "CHOL", "HDL", "LDLCALC", "TRIG", "CHOLHDL", "LDLDIRECT" in the last 72 hours. Thyroid Function Tests: No results for input(s): "TSH", "T4TOTAL", "FREET4", "T3FREE", "THYROIDAB" in the last 72 hours.  Anemia Panel: No results for input(s): "VITAMINB12", "FOLATE", "FERRITIN", "TIBC", "IRON", "RETICCTPCT" in the last 72 hours.  Sepsis Labs: Recent Labs  Lab 12/04/22 1745 12/04/22 2050  PROCALCITON  --  <0.10  LATICACIDVEN 0.6  --     Recent Results (from the past 240 hour(s))  Culture, blood (routine x 2)     Status: None   Collection Time: 12/04/22  3:42 PM   Specimen: BLOOD  Result Value Ref Range Status   Specimen Description   Final    BLOOD RIGHT ANTECUBITAL Performed at [facility], [address], [place], [place] [address]    Special Requests   Final    BOTTLES DRAWN AEROBIC AND ANAEROBIC Blood Culture adequate volume Performed at [facility], [address], [place], [place] [address]    Culture   Final    NO GROWTH 5 DAYS Performed at [facility], [address], [place], [place] [address]    Report Status 12/09/2022 FINAL  Final  Culture, blood (routine x  2)     Status: None   Collection Time: 12/04/22  4:39 PM   Specimen: BLOOD RIGHT HAND  Result Value Ref Range Status   Specimen Description   Final    BLOOD RIGHT HAND Performed at [facility], [address], [place], [place] [address]    Special Requests   Final    BOTTLES DRAWN AEROBIC ONLY Blood Culture results may not be optimal due to an inadequate volume of blood received in culture bottles Performed at [facility], [address], [place], [place] [address]    Culture   Final    NO GROWTH 5 DAYS Performed at [facility], [address], [place], [place] [address]    Report Status 12/09/2022 FINAL  Final  SARS Coronavirus 2 by RT PCR (hospital order, performed in [facility] hospital lab) *cepheid single result test* Anterior Nasal Swab     Status: None   Collection Time: 12/04/22  9:37 PM   Specimen: Anterior Nasal Swab  Result Value Ref Range Status   SARS Coronavirus 2 by RT PCR NEGATIVE NEGATIVE Final    Comment: (NOTE) SARS-CoV-2 target nucleic acids are NOT DETECTED.  The SARS-CoV-2 RNA is generally detectable in upper and lower respiratory specimens during the acute phase of infection. The lowest concentration of SARS-CoV-2 viral copies this assay can detect is 250 copies / mL. A negative result does not preclude SARS-CoV-2 infection and should not be used as the sole basis for treatment or other patient management decisions.  A negative result may occur with improper specimen collection / handling, submission of specimen other than nasopharyngeal swab, presence of viral mutation(s) within the areas targeted by this assay, and inadequate number of viral copies (<250 copies / mL). A negative result must be combined with clinical observations, patient history,  and epidemiological information.  Fact Sheet for Patients:   RoadLapTop.co.za  Fact Sheet for Healthcare  Providers: http://kim-miller.com/  This test is not yet approved or  cleared by the [place] FDA and has been authorized for detection and/or diagnosis of SARS-CoV-2 by FDA under an Emergency Use Authorization (EUA).  This EUA will remain in effect (meaning this test can be used) for the duration of the COVID-19 declaration under Section 564(b)(1) of the Act, 21 U.S.C. section 360bbb-3(b)(1), unless the authorization is terminated or revoked sooner.  Performed at [facility], [address], [place], [place] [address]   Respiratory (~20 pathogens) panel by PCR     Status: None   Collection Time: 12/04/22  9:37 PM   Specimen: Anterior Nasal Swab; Respiratory  Result Value Ref Range Status   Adenovirus NOT DETECTED NOT DETECTED Final   Coronavirus 229E NOT DETECTED NOT DETECTED Final    Comment: (NOTE) The Coronavirus on the Respiratory Panel, DOES NOT test for the novel  Coronavirus (2019 nCoV)    Coronavirus HKU1 NOT DETECTED NOT DETECTED Final   Coronavirus NL63 NOT DETECTED NOT DETECTED Final   Coronavirus OC43 NOT DETECTED NOT DETECTED Final   Metapneumovirus NOT DETECTED NOT DETECTED Final   Rhinovirus / Enterovirus NOT DETECTED NOT DETECTED Final   Influenza A NOT DETECTED NOT DETECTED Final   Influenza B NOT DETECTED NOT DETECTED Final   Parainfluenza Virus 1 NOT DETECTED NOT DETECTED Final   Parainfluenza Virus 2 NOT DETECTED NOT DETECTED Final   Parainfluenza Virus 3 NOT DETECTED NOT DETECTED Final   Parainfluenza Virus 4 NOT DETECTED NOT DETECTED Final   Respiratory Syncytial Virus NOT DETECTED NOT DETECTED Final   Bordetella pertussis NOT DETECTED NOT DETECTED Final   Bordetella Parapertussis NOT DETECTED NOT DETECTED Final   Chlamydophila pneumoniae NOT DETECTED NOT DETECTED Final   Mycoplasma pneumoniae NOT DETECTED NOT DETECTED Final    Comment: Performed at [facility], [address] [place], [place] [address]   C Difficile Quick Screen w PCR reflex     Status: None   Collection Time: 12/05/22 10:17 PM  Result Value Ref Range Status   C Diff antigen NEGATIVE NEGATIVE Final   C Diff toxin NEGATIVE NEGATIVE Final   C Diff interpretation No C. difficile detected.  Final    Comment: Performed at [facility], [address] [place], [place] [address]     Radiology Studies: [facility] Chest Port 1 View  Result Date: 12/11/2022 CLINICAL DATA:  Atelectasis. EXAM: PORTABLE CHEST 1 VIEW COMPARISON:  12/10/2022 FINDINGS: Persistent densities at both lung bases likely represent bilateral pleural effusions with atelectasis. Cannot exclude basilar airspace disease particularly on the right side. Heart size appears within normal limits and stable. The trachea is midline. Atherosclerotic calcifications at the aortic arch. Again noted are surgical clips overlying the left side of the chest and breast region. IMPRESSION: Persistent densities at both lung bases. Findings are most compatible with bilateral pleural effusions with atelectasis. Cannot exclude airspace disease particularly on the right side. Electronically Signed   By: Richarda Overlie M.D.   On: 12/11/2022 08:21   DG CHEST PORT 1 VIEW  Result Date: 12/10/2022 CLINICAL DATA:  Acute respiratory failure.  Shortness of breath EXAM: PORTABLE CHEST 1 VIEW COMPARISON:  12/08/2022 FINDINGS: Surgical clips of the left breast and left axilla. Patient rotated to the right. Midline trachea. Mild cardiomegaly. Atherosclerosis in the transverse aorta. Similar small bilateral pleural effusions. No pneumothorax.  Suspect pulmonary venous congestion. Persistent bibasilar airspace disease. IMPRESSION: No significant change since 12/08/2022. Small bilateral pleural effusions with adjacent airspace disease, most likely atelectasis. Cardiomegaly with mild pulmonary venous congestion. Aortic Atherosclerosis (ICD10-I70.0). Electronically Signed   By: Jeronimo Greaves M.D.   On:  12/10/2022 15:09     Scheduled Meds:  anastrozole  1 mg Oral Daily   arformoterol  15 mcg Nebulization BID   aspirin  81 mg Oral Daily   budesonide (PULMICORT) nebulizer solution  0.25 mg Nebulization BID   dextromethorphan-guaiFENesin  1 tablet Oral BID   furosemide  40 mg Intravenous Q6H   hydrOXYzine  10 mg Oral TID   influenza vaccine adjuvanted  0.5 mL Intramuscular Tomorrow-1000   midodrine  2.5 mg Oral BID WC   pantoprazole  40 mg Oral BID   potassium chloride  30 mEq Oral BID   rivaroxaban  20 mg Oral Q supper   sodium chloride flush  3 mL Intravenous Q12H   sodium chloride HYPERTONIC  4 mL Nebulization BID   Continuous Infusions:     LOS: 7 days      Briant Cedar, MD  12/11/2022, 4:14 PM

## 2022-12-12 ENCOUNTER — Inpatient Hospital Stay (HOSPITAL_COMMUNITY): Payer: Medicare Other

## 2022-12-12 DIAGNOSIS — J690 Pneumonitis due to inhalation of food and vomit: Secondary | ICD-10-CM

## 2022-12-12 DIAGNOSIS — J9601 Acute respiratory failure with hypoxia: Secondary | ICD-10-CM | POA: Diagnosis not present

## 2022-12-12 LAB — BASIC METABOLIC PANEL
Anion gap: 10 (ref 5–15)
BUN: 22 mg/dL (ref 8–23)
CO2: 27 mmol/L (ref 22–32)
Calcium: 8.3 mg/dL — ABNORMAL LOW (ref 8.9–10.3)
Chloride: 100 mmol/L (ref 98–111)
Creatinine, Ser: 2.15 mg/dL — ABNORMAL HIGH (ref 0.44–1.00)
GFR, Estimated: 22 mL/min — ABNORMAL LOW (ref >60)
Glucose, Bld: 92 mg/dL (ref 70–99)
Potassium: 4 mmol/L (ref 3.5–5.1)
Sodium: 137 mmol/L (ref 135–145)

## 2022-12-12 LAB — CBC WITH DIFFERENTIAL/PLATELET
Abs Immature Granulocytes: 0.01 10*3/uL (ref 0.00–0.07)
Basophils Absolute: 0 10*3/uL (ref 0.0–0.1)
Basophils Relative: 1 %
Eosinophils Absolute: 0.1 10*3/uL (ref 0.0–0.5)
Eosinophils Relative: 2 %
HCT: 22.6 % — ABNORMAL LOW (ref 36.0–46.0)
Hemoglobin: 7.2 g/dL — ABNORMAL LOW (ref 12.0–15.0)
Immature Granulocytes: 0 %
Lymphocytes Relative: 23 %
Lymphs Abs: 0.6 10*3/uL — ABNORMAL LOW (ref 0.7–4.0)
MCH: 34.3 pg — ABNORMAL HIGH (ref 26.0–34.0)
MCHC: 31.9 g/dL (ref 30.0–36.0)
MCV: 107.6 fL — ABNORMAL HIGH (ref 80.0–100.0)
Monocytes Absolute: 0.3 10*3/uL (ref 0.1–1.0)
Monocytes Relative: 12 %
Neutro Abs: 1.5 10*3/uL — ABNORMAL LOW (ref 1.7–7.7)
Neutrophils Relative %: 62 %
Platelets: 104 10*3/uL — ABNORMAL LOW (ref 150–400)
RBC: 2.1 MIL/uL — ABNORMAL LOW (ref 3.87–5.11)
RDW: 17.2 % — ABNORMAL HIGH (ref 11.5–15.5)
WBC: 2.5 10*3/uL — ABNORMAL LOW (ref 4.0–10.5)
nRBC: 0 % (ref 0.0–0.2)

## 2022-12-12 MED ORDER — FUROSEMIDE 10 MG/ML IJ SOLN
40.0000 mg | Freq: Once | INTRAMUSCULAR | Status: AC
  Administered 2022-12-13: 40 mg via INTRAVENOUS
  Filled 2022-12-12: qty 4

## 2022-12-12 MED ORDER — ORAL CARE MOUTH RINSE: 15.0000 mL | OROMUCOSAL | Status: DC | PRN

## 2022-12-12 NOTE — Plan of Care (Signed)
  Problem: Activity: Goal: Ability to tolerate increased activity will improve Outcome: Progressing   Problem: Skin Integrity: Goal: Risk for impaired skin integrity will decrease Outcome: Progressing   Problem: Safety: Goal: Ability to remain free from injury will improve Outcome: Progressing   Problem: Respiratory: Goal: Ability to maintain adequate ventilation will improve Outcome: Progressing   Problem: Clinical Measurements: Goal: Ability to maintain a body temperature in the normal range will improve Outcome: Progressing   Problem: Education: Goal: Knowledge of General Education information will improve Description: Including pain rating scale, medication(s)/side effects and non-pharmacologic comfort measures Outcome: Progressing

## 2022-12-12 NOTE — Progress Notes (Signed)
NAME:  Stacey [name], MRN:  [id number], DOB:  07/25/1940, LOS: 8 ADMISSION DATE:  12/04/2022, CONSULTATION DATE:  11/7 REFERRING MD:  [name], CHIEF COMPLAINT:  recurrent PNA/resp failure    History of Present Illness:  82 year old female w/ metastatic breast CA (stage IV LN and bone), currently on Ibrace M-F, fulvestrant q28d and arimidex  Recently dc'd from hospital 10/18 after being treated for PNA (NOS). Completed abx at home but still had some shortness of breath and fatigue (with mix of both progressing). She denied fever, chest pain, vomiting, syncope. Did endorse dry non-productive cough. Her weakness progressed to point she could not get OOB so EMS was called on 11/1. Initial CXR negative for acute disease. F/u Ct chest neg for PE but did show subpleural chronic changes, bilateral bronchial wall thickening. New solid nodules in lingula and mucous plugging or RLL. Also marked kyphosis, mod hiatal hernia and emphysema changes.  She was placed on supplemental oxygen, IV unasyn to cover for aspiration, and IV hydration. Since admit she completed 5 d of abx. Had a swallow eval which was neg for aspiration but continues to require supplemental oxygen. PCCM asked to see because of her recurrent shortness of breath and on-going O2 needs.   Pertinent  Medical History  Metastatic breast CA (stage IV LN and bone), currently on Ibrace M-F, fulvestrant q28d and arimidex  HFpEF H/o DVT Chronic diarrhea  CKD stage IIIb Chronic anemia  Severe anxiety  5 mm pulm nodule  Significant Hospital Events: Including procedures, antibiotic start and stop dates in addition to other pertinent events   11/1 admitted placed on abx 11/6 completed abx 11/7 pulm asked to see for on-going hypoxia   Interim History / Subjective:  I had turned her down to [facility] and she is now back up to [facility] satting 97-98%. Had BM this morning.   Objective   Blood pressure (!) 96/43, pulse 90, temperature 98.4 F (36.9 C),  temperature source Oral, resp. rate 14, height 5\' 3"  (1.6 m), weight 65 kg, SpO2 97%.        Intake/Output Summary (Last 24 hours) at 12/12/2022 1439 Last data filed at 12/12/2022 0957 Gross per 24 hour  Intake 243 ml  Output 1750 ml  Net -1507 ml   Filed Weights   12/05/22 1101 12/05/22 1245 12/06/22 0524  Weight: 61.2 kg 64.2 kg 65 kg    Examination: Frail elderly woman No significant distress On nasal cannula Bibasilar crackles RRR  Extremities thin, no edema.  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  - lasix tomorrow x 1 the am, she will need discharge on a consistent oral dose - continue IS - continue nebulizers with mucinex and BID PPI - needs daily OOB - she will go to rehab with oxygen and hopefully it will be able to be weaned off prior to discharge home.   Pulmonary nodules Plan Query metastatic disease  Durel Salts, MD Pulmonary and Critical  Care Medicine [facility] 12/12/2022 2:43 PM Pager: see AMION  If no response to pager, please call critical care on call (see AMION) until 7pm After 7:00 pm call Elink

## 2022-12-12 NOTE — Progress Notes (Signed)
PROGRESS NOTE    Stacey [name]  ONG:[id number] DOB: 11/11/1940 DOA: 12/04/2022 PCP: [name], MD   Brief Narrative:  82 y.o. female with medical history significant of metastaic breast ca for which patient is on  ibrance. Patient was discharged from the [facility] health system on October 18, approximately 2 weeks ago after treatment for sepsis secondary to pneumonia that was present on admission affecting the right lower lobe. Overall she received 7 days of antibiotics.  She initially felt better but then she began to have shortness of breath generalized weakness. Patient does have chronic kidney disease stage IIIb and also has chronic diarrhea that has been attributed to [place] use for which she takes Imodium at home.    Today, pt denies any new complaints. Still requiring O2.  Creatinine rising on Lasix     Assessment & Plan:   Principal Problem:   Pneumonia Active Problems:   Anemia   HTN (hypertension)   Recurrent cancer of left breast (HCC)   Metabolic acidosis   Hypocalcemia   Fatigue   Immunosuppression due to chronic steroid use (HCC)   DVT (deep venous thrombosis) (HCC)   Diarrhea   Acute respiratory failure with hypoxia (HCC)   Acute hypoxic respiratory failure secondary to ?HCAP Mucous plugging/recurrent aspiration Currently on 4L of O2 Completed a course of IV antibiotics 5 days duration on 12/08/2022. Repeat chest x-ray on 12/08/2022 with development of small bilateral pleural effusions and bibasilar airspace disease, most likely atelectasis. Infection or aspiration could look similar. CTA chest no pulmonary embolism.  Mild bilateral bronchial wall thickening consistent with bronchitis.  New clustered solid nodule seen in the lingula and unchanged clustered nodules and mucous plugging of the right lower lobe likely due to infection or aspiration.  Respiratory virus panel negative.  COVID negative. Flu was neg -2 weeks ago. Continue IS/Flutter valve/nebs/hypertonic  saline PCCM consulted, appreciate recs PT/OT  Chronic distolic HF BNP 164 ECHO showed EF of 60-65%, grade 1 DD PCCM giving lasix, creatinine rising  AKI on CKD stage IIIb Noted acute urinary retention-appears to have resolved Bladder scan with noted retention, in and out X 2 on 11/5 and 11/6 Creatinine rising, receiving Lasix Renal ultrasound unremarkable CT renal stone pending Daily BMP  History of DVT Continue Xarelto  Chronic diarrhea  Secondary to Ibrance in addition to recent antibiotic use and multiple bowel movements Stool C. 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  Severe anxiety  Does not take anything at home, was started on Xanax as needed with Atarax.      Estimated body mass index is 25.38 kg/m as calculated from the following:   Height as of this encounter: 5\' 3"  (1.6 m).   Weight as of this encounter: 65 kg.  DVT prophylaxis: Xarelto code Status: Full code Family Communication: None at bedside Disposition Plan:  Status is: Inpatient Remains inpatient appropriate because: Acute hypoxia   Consultants:  PCCM  Procedures: None  Antimicrobials: Completed   Objective: Vitals:   12/11/22 2148 12/12/22 0541 12/12/22 0804 12/12/22 4401  BP: (!) 111/50 (!) 102/41  (!) 96/43  Pulse: 93 89  90  Resp:  16  14  Temp: 99.2 F (37.3 C) 98.8 F (37.1 C)  98.4 F (36.9 C)  TempSrc: Oral Oral  Oral  SpO2: 99% 100% 93% 97%  Weight:      Height:         Intake/Output Summary (Last 24 hours) at 12/12/2022 1556 Last data filed at 12/12/2022 0957 Gross per 24 hour  Intake 103 ml  Output 1350 ml  Net -1247 ml   Filed Weights   12/05/22 1101 12/05/22 1245 12/06/22 0524  Weight: 61.2 kg 64.2 kg 65 kg    Examination: General: NAD  Cardiovascular: S1, S2 present Respiratory: Diminished BS b/l Abdomen: Soft, nontender, nondistended, bowel sounds present Musculoskeletal: RLE edema > than LLE Skin: Normal Psychiatry: Normal mood    Data Reviewed: I have personally reviewed following labs and imaging studies  CBC: Recent Labs  Lab 12/08/22 0921 12/09/22 0408 12/10/22 0419 12/11/22 0418 12/11/22 1259 12/12/22 0447  WBC 3.1* 3.1* 2.5* 2.6*  --  2.5*  NEUTROABS  --   --  1.7 1.7  --  1.5*  HGB 10.2* 8.7* 8.3* 7.0* 7.2* 7.2*  HCT 31.0* 26.8* 25.7* 22.2* 22.8* 22.6*  MCV 104.4* 103.5* 106.6* 106.2*  --  107.6*  PLT 133* 120* 111* 119*  --  104*   Basic Metabolic Panel: Recent Labs  Lab 12/06/22 0432 12/06/22 1700 12/08/22 0921 12/09/22 0408 12/10/22 0414 12/10/22 0419 12/11/22 0418 12/12/22 0447  NA 139   < > 143 137  --  136 138 137  K 3.0*   < > 3.8 4.4  --  4.3 3.9 4.0  CL 99   < > 100 102  --  101 101 100  CO2 29   < > 30 28  --  25 28 27   GLUCOSE 142*   < > 142* 103*  --  109* 104* 92  BUN 15   < > 15 17  --  22 23 22   CREATININE 1.30*   < > 1.68* 1.74*  --  1.77* 1.99* 2.15*  CALCIUM 6.0*   < > 6.9* 7.1*  --  7.4* 8.1* 8.3*  MG 1.7  --  1.2* 1.5* 1.9  --   --   --    < > = values in this interval not displayed.   GFR: Estimated Creatinine Clearance: 18.3 mL/min (A) (by C-G formula based on SCr of 2.15 mg/dL (H)). Liver Function Tests: Recent Labs  Lab 12/06/22 0432 12/06/22 1700 12/08/22 0921 12/09/22 0408  AST 21 22 21 17   ALT 11 13 13 11   ALKPHOS 39 42 50 40  BILITOT 0.6 0.6 0.7 0.5  PROT 5.3* 5.7* 6.2* 5.3*  ALBUMIN 2.4* 2.5* 2.7* 2.3*   No results for input(s): "LIPASE", "AMYLASE" in the  last 168 hours. No results for input(s): "AMMONIA" in the last 168 hours. Coagulation Profile: No results for input(s): "INR", "PROTIME" in the last 168 hours.  Cardiac Enzymes: No results for input(s): "CKTOTAL", "CKMB", "CKMBINDEX", "TROPONINI" in the last 168 hours. BNP (last 3 results) No results for input(s): "PROBNP" in the last 8760 hours. HbA1C: No results for input(s): "HGBA1C" in the last 72 hours. CBG: No results for input(s): "GLUCAP" in the last 168 hours. Lipid Profile: No results for input(s): "CHOL", "HDL", "LDLCALC", "TRIG", "CHOLHDL", "LDLDIRECT" in the last 72 hours. Thyroid Function Tests: No results for input(s): "TSH", "T4TOTAL", "FREET4", "T3FREE", "THYROIDAB"  in the last 72 hours.  Anemia Panel: No results for input(s): "VITAMINB12", "FOLATE", "FERRITIN", "TIBC", "IRON", "RETICCTPCT" in the last 72 hours.  Sepsis Labs: No results for input(s): "PROCALCITON", "LATICACIDVEN" in the last 168 hours.   Recent Results (from the past 240 hour(s))  Culture, blood (routine x 2)     Status: None   Collection Time: 12/04/22  3:42 PM   Specimen: BLOOD  Result Value Ref Range Status   Specimen Description   Final    BLOOD RIGHT ANTECUBITAL Performed at [facility], [address] [place], [place] [address]    Special Requests   Final    BOTTLES DRAWN AEROBIC AND ANAEROBIC Blood Culture adequate volume Performed at [facility], [address] [place], [place] [address]    Culture   Final    NO GROWTH 5 DAYS Performed at [facility], [address], [place], [place] [address]    Report Status 12/09/2022 FINAL  Final  Culture, blood (routine x 2)     Status: None   Collection Time: 12/04/22  4:39 PM   Specimen: BLOOD RIGHT HAND  Result Value Ref Range Status   Specimen Description   Final    BLOOD RIGHT HAND Performed at [facility], [address], [place], [place] [address]    Special Requests   Final     BOTTLES DRAWN AEROBIC ONLY Blood Culture results may not be optimal due to an inadequate volume of blood received in culture bottles Performed at [facility], [address], [place], [place] [address]    Culture   Final    NO GROWTH 5 DAYS Performed at [facility], [address], [place], [place] [address]    Report Status 12/09/2022 FINAL  Final  SARS Coronavirus 2 by RT PCR (hospital order, performed in [facility] hospital lab) *cepheid single result test* Anterior Nasal Swab     Status: None   Collection Time: 12/04/22  9:37 PM   Specimen: Anterior Nasal Swab  Result Value Ref Range Status   SARS Coronavirus 2 by RT PCR NEGATIVE NEGATIVE Final    Comment: (NOTE) SARS-CoV-2 target nucleic acids are NOT DETECTED.  The SARS-CoV-2 RNA is generally detectable in upper and lower respiratory specimens during the acute phase of infection. The lowest concentration of SARS-CoV-2 viral copies this assay can detect is 250 copies / mL. A negative result does not preclude SARS-CoV-2 infection and should not be used as the sole basis for treatment or other patient management decisions.  A negative result may occur with improper specimen collection / handling, submission of specimen other than nasopharyngeal swab, presence of viral mutation(s) within the areas targeted by this assay, and inadequate number of viral copies (<250 copies / mL). A negative result must be combined with clinical observations, patient history, and epidemiological information.  Fact Sheet for Patients:   RoadLapTop.co.za  Fact Sheet for Healthcare Providers: http://kim-miller.com/  This test is not yet approved or  cleared by the [place] FDA and has been authorized for detection and/or diagnosis of SARS-CoV-2 by FDA under an Emergency Use Authorization (EUA).  This EUA will remain in effect (meaning this test can be used) for the  duration of the COVID-19 declaration under Section 564(b)(1) of the Act, 21 U.S.C. section 360bbb-3(b)(1), unless the authorization is terminated or revoked sooner.  Performed at [facility], [address] [place], [place] [address]   Respiratory (~  20 pathogens) panel by PCR     Status: None   Collection Time: 12/04/22  9:37 PM   Specimen: Anterior Nasal Swab; Respiratory  Result Value Ref Range Status   Adenovirus NOT DETECTED NOT DETECTED Final   Coronavirus 229E NOT DETECTED NOT DETECTED Final    Comment: (NOTE) The Coronavirus on the Respiratory Panel, DOES NOT test for the novel  Coronavirus (2019 nCoV)    Coronavirus HKU1 NOT DETECTED NOT DETECTED Final   Coronavirus NL63 NOT DETECTED NOT DETECTED Final   Coronavirus OC43 NOT DETECTED NOT DETECTED Final   Metapneumovirus NOT DETECTED NOT DETECTED Final   Rhinovirus / Enterovirus NOT DETECTED NOT DETECTED Final   Influenza A NOT DETECTED NOT DETECTED Final   Influenza B NOT DETECTED NOT DETECTED Final   Parainfluenza Virus 1 NOT DETECTED NOT DETECTED Final   Parainfluenza Virus 2 NOT DETECTED NOT DETECTED Final   Parainfluenza Virus 3 NOT DETECTED NOT DETECTED Final   Parainfluenza Virus 4 NOT DETECTED NOT DETECTED Final   Respiratory Syncytial Virus NOT DETECTED NOT DETECTED Final   Bordetella pertussis NOT DETECTED NOT DETECTED Final   Bordetella Parapertussis NOT DETECTED NOT DETECTED Final   Chlamydophila pneumoniae NOT DETECTED NOT DETECTED Final   Mycoplasma pneumoniae NOT DETECTED NOT DETECTED Final    Comment: Performed at [facility], [address] [place], [place] [address]  C Difficile Quick Screen w PCR reflex     Status: None   Collection Time: 12/05/22 10:17 PM  Result Value Ref Range Status   C Diff antigen NEGATIVE NEGATIVE Final   C Diff toxin NEGATIVE NEGATIVE Final   C Diff interpretation No C. difficile detected.  Final    Comment: Performed at [facility], [address], [place], [place] [address]     Radiology Studies: [facility] Chest Port 1 View  Result Date: 12/12/2022 CLINICAL DATA:  Bilateral pleural effusion. EXAM: PORTABLE CHEST 1 VIEW COMPARISON:  12/11/2022 FINDINGS: The cardio pericardial silhouette is enlarged. Basilar atelectasis with small bilateral pleural effusions. Interstitial markings are diffusely coarsened with chronic features. Bones are diffusely demineralized. Telemetry leads overlie the chest. IMPRESSION: Basilar atelectasis with small bilateral pleural effusions. Electronically Signed   By: Kennith Center M.D.   On: 12/12/2022 10:25   DG Chest Port 1 View  Result Date: 12/11/2022 CLINICAL DATA:  Atelectasis. EXAM: PORTABLE CHEST 1 VIEW COMPARISON:  12/10/2022 FINDINGS: Persistent densities at both lung bases likely represent bilateral pleural effusions with atelectasis. Cannot exclude basilar airspace disease particularly on the right side. Heart size appears within normal limits and stable. The trachea is midline. Atherosclerotic calcifications at the aortic arch. Again noted are surgical clips overlying the left side of the chest and breast region. IMPRESSION: Persistent densities at both lung bases. Findings are most compatible with bilateral pleural effusions with atelectasis. Cannot exclude airspace disease particularly on the right side. Electronically Signed   By: Richarda Overlie M.D.   On: 12/11/2022 08:21     Scheduled Meds:  anastrozole  1 mg Oral Daily   arformoterol  15 mcg Nebulization BID   aspirin  81 mg Oral Daily   budesonide (PULMICORT) nebulizer solution  0.25 mg Nebulization BID   dextromethorphan-guaiFENesin  1 tablet Oral BID   [START ON 12/13/2022] furosemide  40 mg Intravenous Once   hydrOXYzine  10 mg Oral TID   influenza vaccine adjuvanted  0.5 mL Intramuscular Tomorrow-1000   midodrine  2.5 mg Oral BID WC   pantoprazole  40 mg Oral BID   rivaroxaban  20 mg Oral Q supper   sodium chloride flush   3 mL Intravenous Q12H   sodium chloride HYPERTONIC  4 mL Nebulization BID   Continuous Infusions:     LOS: 8 days      Briant Cedar, MD  12/12/2022, 3:56 PM

## 2022-12-13 ENCOUNTER — Inpatient Hospital Stay (HOSPITAL_COMMUNITY): Payer: Medicare Other

## 2022-12-13 DIAGNOSIS — J9601 Acute respiratory failure with hypoxia: Secondary | ICD-10-CM | POA: Diagnosis not present

## 2022-12-13 DIAGNOSIS — J690 Pneumonitis due to inhalation of food and vomit: Secondary | ICD-10-CM | POA: Diagnosis not present

## 2022-12-13 LAB — CBC WITH DIFFERENTIAL/PLATELET
Abs Immature Granulocytes: 0 10*3/uL (ref 0.00–0.07)
Basophils Absolute: 0.1 10*3/uL (ref 0.0–0.1)
Basophils Relative: 3 %
Eosinophils Absolute: 0.1 10*3/uL (ref 0.0–0.5)
Eosinophils Relative: 3 %
HCT: 23 % — ABNORMAL LOW (ref 36.0–46.0)
Hemoglobin: 7.4 g/dL — ABNORMAL LOW (ref 12.0–15.0)
Lymphocytes Relative: 18 %
Lymphs Abs: 0.5 10*3/uL — ABNORMAL LOW (ref 0.7–4.0)
MCH: 34.6 pg — ABNORMAL HIGH (ref 26.0–34.0)
MCHC: 32.2 g/dL (ref 30.0–36.0)
MCV: 107.5 fL — ABNORMAL HIGH (ref 80.0–100.0)
Monocytes Absolute: 0.1 10*3/uL (ref 0.1–1.0)
Monocytes Relative: 3 %
Neutro Abs: 1.8 10*3/uL (ref 1.7–7.7)
Neutrophils Relative %: 73 %
Platelets: 109 10*3/uL — ABNORMAL LOW (ref 150–400)
RBC: 2.14 MIL/uL — ABNORMAL LOW (ref 3.87–5.11)
RDW: 17 % — ABNORMAL HIGH (ref 11.5–15.5)
WBC: 2.5 10*3/uL — ABNORMAL LOW (ref 4.0–10.5)
nRBC: 0 % (ref 0.0–0.2)

## 2022-12-13 LAB — BASIC METABOLIC PANEL
Anion gap: 10 (ref 5–15)
BUN: 22 mg/dL (ref 8–23)
CO2: 27 mmol/L (ref 22–32)
Calcium: 8.8 mg/dL — ABNORMAL LOW (ref 8.9–10.3)
Chloride: 101 mmol/L (ref 98–111)
Creatinine, Ser: 2.12 mg/dL — ABNORMAL HIGH (ref 0.44–1.00)
GFR, Estimated: 23 mL/min — ABNORMAL LOW (ref >60)
Glucose, Bld: 105 mg/dL — ABNORMAL HIGH (ref 70–99)
Potassium: 3.9 mmol/L (ref 3.5–5.1)
Sodium: 138 mmol/L (ref 135–145)

## 2022-12-13 NOTE — Plan of Care (Signed)
  Problem: Education: Goal: Knowledge of General Education information will improve Description: Including pain rating scale, medication(s)/side effects and non-pharmacologic comfort measures Outcome: Progressing   Problem: Clinical Measurements: Goal: Diagnostic test results will improve Outcome: Progressing   Problem: Activity: Goal: Risk for activity intolerance will decrease Outcome: Progressing   Problem: Coping: Goal: Level of anxiety will decrease Outcome: Progressing   Problem: Pain Management: Goal: General experience of comfort will improve Outcome: Progressing   Problem: Safety: Goal: Ability to remain free from injury will improve Outcome: Progressing

## 2022-12-13 NOTE — Progress Notes (Signed)
PROGRESS NOTE    Stacey [name]  JOA:[id number] DOB: January 10, 1941 DOA: 12/04/2022 PCP: [name], MD   Brief Narrative:  82 y.o. female with medical history significant of metastaic breast ca for which patient is on  ibrance. Patient was discharged from the [facility] health system on October 18, approximately 2 weeks ago after treatment for sepsis secondary to pneumonia that was present on admission affecting the right lower lobe. Overall she received 7 days of antibiotics.  She initially felt better but then she began to have shortness of breath generalized weakness. Patient does have chronic kidney disease stage IIIb and also has chronic diarrhea that has been attributed to [place] use for which she takes Imodium at home.    Today, pt denies any new complaints, eager to ambulate.     Assessment & Plan:   Principal Problem:   Pneumonia Active Problems:   Anemia   HTN (hypertension)   Recurrent cancer of left breast (HCC)   Metabolic acidosis   Hypocalcemia   Fatigue   Immunosuppression due to chronic steroid use (HCC)   DVT (deep venous thrombosis) (HCC)   Diarrhea   Acute respiratory failure with hypoxia (HCC)   Acute hypoxic respiratory failure secondary to ?HCAP Mucous plugging/recurrent aspiration Currently on 2L of O2 Completed a course of IV antibiotics 5 days duration on 12/08/2022. Repeat chest x-ray on 12/08/2022 with development of small bilateral pleural effusions and bibasilar airspace disease, most likely atelectasis. Infection or aspiration could look similar. CTA chest no pulmonary embolism.  Mild bilateral bronchial wall thickening consistent with bronchitis.  New clustered solid nodule seen in the lingula and unchanged clustered nodules and mucous plugging of the right lower lobe likely due to infection or aspiration.  Respiratory virus panel negative.  COVID negative. Flu was neg -2 weeks ago. Continue IS/Flutter valve/nebs/hypertonic saline PCCM consulted,  appreciate recs PT/OT  Chronic distolic HF BNP 164 ECHO showed EF of 60-65%, grade 1 DD PCCM gave lasix prn, creatinine peaked, appeared to have diuresed some   AKI on CKD stage IIIb Noted acute urinary retention-appears to have resolved Bladder scan with noted retention, in and out X 2 on 11/5 and 11/6 Received Lasix Renal ultrasound unremarkable CT renal stone pending Daily BMP  History of DVT Continue Xarelto  Chronic diarrhea  Secondary to Ibrance in addition to recent antibiotic use and multiple bowel movements Stool C. 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  Severe anxiety  Does not take anything at home, was started on Xanax as needed with Atarax.      Estimated body mass index is 25.38 kg/m as calculated from the following:   Height as of this encounter: 5\' 3"  (1.6 m).   Weight as of this encounter: 65 kg.  DVT prophylaxis: Xarelto code Status: Full code Family Communication: None at bedside Disposition Plan:  Status is: Inpatient Remains inpatient appropriate because: Acute hypoxia   Consultants:  PCCM  Procedures: None  Antimicrobials: Completed   Objective: Vitals:   12/13/22 0403 12/13/22 0816 12/13/22 0913 12/13/22 1422  BP: 107/84  (!) 111/56 (!) 112/59  Pulse: 97  97 86  Resp:   18   Temp: 98.6 F (37 C)  98.9 F (37.2 C) 98.9 F (37.2 C)  TempSrc: Oral  Oral Oral  SpO2: 99% 94% 95% 97%  Weight:      Height:         Intake/Output Summary (Last 24 hours) at 12/13/2022 1504 Last data filed at 12/13/2022 1044 Gross per 24 hour  Intake 600 ml  Output 1200 ml  Net -600 ml   Filed Weights   12/05/22 1101 12/05/22 1245 12/06/22 0524  Weight: 61.2 kg 64.2 kg 65 kg    Examination: General: NAD  Cardiovascular: S1, S2 present Respiratory: Diminished BS b/l Abdomen: Soft, nontender, nondistended, bowel sounds present Musculoskeletal: RLE edema > than LLE Skin: Normal Psychiatry: Normal mood    Data Reviewed: I have personally reviewed following labs and imaging studies  CBC: Recent Labs  Lab 12/09/22 0408 12/10/22 0419 12/11/22 0418 12/11/22 1259 12/12/22 0447 12/13/22 0415  WBC 3.1* 2.5* 2.6*  --  2.5* 2.5*  NEUTROABS  --  1.7 1.7  --  1.5* 1.8  HGB 8.7* 8.3* 7.0* 7.2* 7.2* 7.4*  HCT 26.8* 25.7* 22.2* 22.8* 22.6* 23.0*  MCV 103.5* 106.6* 106.2*  --  107.6* 107.5*  PLT 120* 111* 119*  --  104* 109*   Basic Metabolic Panel: Recent Labs  Lab 12/08/22 0921 12/09/22 0408 12/10/22 0414 12/10/22 0419 12/11/22 0418 12/12/22 0447 12/13/22 0415  NA 143 137  --  136 138 137 138  K 3.8 4.4  --  4.3 3.9 4.0 3.9  CL 100 102  --  101 101 100 101  CO2 30 28  --  25 28 27 27   GLUCOSE 142* 103*  --  109* 104* 92 105*  BUN 15 17  --  22 23 22 22   CREATININE 1.68* 1.74*  --  1.77* 1.99* 2.15* 2.12*  CALCIUM 6.9* 7.1*  --  7.4* 8.1* 8.3* 8.8*  MG 1.2* 1.5* 1.9  --   --   --   --    GFR: Estimated Creatinine Clearance: 18.5 mL/min (A) (by C-G formula based on SCr of 2.12 mg/dL (H)). Liver Function Tests: Recent Labs  Lab 12/06/22 1700 12/08/22 0921 12/09/22 0408  AST 22 21 17   ALT 13 13 11   ALKPHOS 42 50 40  BILITOT 0.6 0.7 0.5  PROT 5.7* 6.2* 5.3*  ALBUMIN 2.5* 2.7* 2.3*   No results for input(s): "LIPASE", "AMYLASE" in the last 168 hours. No results for input(s): "AMMONIA" in the last 168 hours. Coagulation Profile: No results for input(s): "INR", "PROTIME" in the last 168  hours.  Cardiac Enzymes: No results for input(s): "CKTOTAL", "CKMB", "CKMBINDEX", "TROPONINI" in the last 168 hours. BNP (last 3 results) No results for input(s): "PROBNP" in the last 8760 hours. HbA1C: No results for input(s): "HGBA1C" in the last 72 hours. CBG: No results for input(s): "GLUCAP" in the last 168 hours. Lipid Profile: No results for input(s): "CHOL", "HDL", "LDLCALC", "TRIG", "CHOLHDL", "LDLDIRECT" in the last 72 hours. Thyroid Function Tests: No results for input(s): "TSH", "T4TOTAL", "FREET4", "T3FREE", "THYROIDAB" in the last 72 hours.  Anemia Panel: No results for input(s): "VITAMINB12", "FOLATE", "FERRITIN", "TIBC", "IRON", "RETICCTPCT" in the last 72 hours.  Sepsis Labs: No results for input(s): "PROCALCITON", "LATICACIDVEN" in the last 168 hours.   Recent Results (from the past 240 hour(s))  Culture, blood (routine x 2)     Status:  None   Collection Time: 12/04/22  3:42 PM   Specimen: BLOOD  Result Value Ref Range Status   Specimen Description   Final    BLOOD RIGHT ANTECUBITAL Performed at [facility], [address], [place], [place] [address]    Special Requests   Final    BOTTLES DRAWN AEROBIC AND ANAEROBIC Blood Culture adequate volume Performed at [facility], [address], [place], [place] [address]    Culture   Final    NO GROWTH 5 DAYS Performed at [facility], [address], [place], [place] [address]    Report Status 12/09/2022 FINAL  Final  Culture, blood (routine x 2)     Status: None   Collection Time: 12/04/22  4:39 PM   Specimen: BLOOD RIGHT HAND  Result Value Ref Range Status   Specimen Description   Final    BLOOD RIGHT HAND Performed at [facility], [address], [place], [place] [address]    Special Requests   Final    BOTTLES DRAWN AEROBIC ONLY Blood Culture results may not be optimal due to an inadequate volume of blood received in culture bottles Performed at  [facility], [address] [place], [place] [address]    Culture   Final    NO GROWTH 5 DAYS Performed at [facility], [address] [place], [place] [address]    Report Status 12/09/2022 FINAL  Final  SARS Coronavirus 2 by RT PCR (hospital order, performed in [facility] hospital lab) *cepheid single result test* Anterior Nasal Swab     Status: None   Collection Time: 12/04/22  9:37 PM   Specimen: Anterior Nasal Swab  Result Value Ref Range Status   SARS Coronavirus 2 by RT PCR NEGATIVE NEGATIVE Final    Comment: (NOTE) SARS-CoV-2 target nucleic acids are NOT DETECTED.  The SARS-CoV-2 RNA is generally detectable in upper and lower respiratory specimens during the acute phase of infection. The lowest concentration of SARS-CoV-2 viral copies this assay can detect is 250 copies / mL. A negative result does not preclude SARS-CoV-2 infection and should not be used as the sole basis for treatment or other patient management decisions.  A negative result may occur with improper specimen collection / handling, submission of specimen other than nasopharyngeal swab, presence of viral mutation(s) within the areas targeted by this assay, and inadequate number of viral copies (<250 copies / mL). A negative result must be combined with clinical observations, patient history, and epidemiological information.  Fact Sheet for Patients:   RoadLapTop.co.za  Fact Sheet for Healthcare Providers: http://kim-miller.com/  This test is not yet approved or  cleared by the [place] FDA and has been authorized for detection and/or diagnosis of SARS-CoV-2 by FDA under an Emergency Use Authorization (EUA).  This EUA will remain in effect (meaning this test can be used) for the duration of the COVID-19 declaration under Section 564(b)(1) of the Act, 21 U.S.C. section 360bbb-3(b)(1), unless the authorization is terminated  or revoked sooner.  Performed at [facility], [address] [place], [place] [address]   Respiratory (~20 pathogens) panel by PCR     Status: None   Collection Time: 12/04/22  9:37 PM   Specimen: Anterior Nasal Swab; Respiratory  Result Value Ref Range Status   Adenovirus NOT DETECTED NOT DETECTED Final   Coronavirus 229E NOT DETECTED NOT DETECTED Final    Comment: (NOTE) The Coronavirus on  the Respiratory Panel, DOES NOT test for the novel  Coronavirus (2019 nCoV)    Coronavirus HKU1 NOT DETECTED NOT DETECTED Final   Coronavirus NL63 NOT DETECTED NOT DETECTED Final   Coronavirus OC43 NOT DETECTED NOT DETECTED Final   Metapneumovirus NOT DETECTED NOT DETECTED Final   Rhinovirus / Enterovirus NOT DETECTED NOT DETECTED Final   Influenza A NOT DETECTED NOT DETECTED Final   Influenza B NOT DETECTED NOT DETECTED Final   Parainfluenza Virus 1 NOT DETECTED NOT DETECTED Final   Parainfluenza Virus 2 NOT DETECTED NOT DETECTED Final   Parainfluenza Virus 3 NOT DETECTED NOT DETECTED Final   Parainfluenza Virus 4 NOT DETECTED NOT DETECTED Final   Respiratory Syncytial Virus NOT DETECTED NOT DETECTED Final   Bordetella pertussis NOT DETECTED NOT DETECTED Final   Bordetella Parapertussis NOT DETECTED NOT DETECTED Final   Chlamydophila pneumoniae NOT DETECTED NOT DETECTED Final   Mycoplasma pneumoniae NOT DETECTED NOT DETECTED Final    Comment: Performed at [facility], [address] [place], [place] [address]  C Difficile Quick Screen w PCR reflex     Status: None   Collection Time: 12/05/22 10:17 PM  Result Value Ref Range Status   C Diff antigen NEGATIVE NEGATIVE Final   C Diff toxin NEGATIVE NEGATIVE Final   C Diff interpretation No C. difficile detected.  Final    Comment: Performed at [facility], [address], [place], [place] [address]     Radiology Studies: [facility] Chest Port 1 View  Result Date: 12/12/2022 CLINICAL DATA:   Bilateral pleural effusion. EXAM: PORTABLE CHEST 1 VIEW COMPARISON:  12/11/2022 FINDINGS: The cardio pericardial silhouette is enlarged. Basilar atelectasis with small bilateral pleural effusions. Interstitial markings are diffusely coarsened with chronic features. Bones are diffusely demineralized. Telemetry leads overlie the chest. IMPRESSION: Basilar atelectasis with small bilateral pleural effusions. Electronically Signed   By: Kennith Center M.D.   On: 12/12/2022 10:25     Scheduled Meds:  anastrozole  1 mg Oral Daily   arformoterol  15 mcg Nebulization BID   aspirin  81 mg Oral Daily   budesonide (PULMICORT) nebulizer solution  0.25 mg Nebulization BID   dextromethorphan-guaiFENesin  1 tablet Oral BID   hydrOXYzine  10 mg Oral TID   influenza vaccine adjuvanted  0.5 mL Intramuscular Tomorrow-1000   midodrine  2.5 mg Oral BID WC   pantoprazole  40 mg Oral BID   rivaroxaban  20 mg Oral Q supper   sodium chloride flush  3 mL Intravenous Q12H   Continuous Infusions:     LOS: 9 days      Briant Cedar, MD  12/13/2022, 3:04 PM

## 2022-12-13 NOTE — Progress Notes (Signed)
Mobility Specialist - Progress Note   12/13/22 1500  Mobility  Activity Stood at bedside  Level of Assistance Minimal assist, patient does 75% or more  Assistive Device Front wheel walker  Distance Ambulated (ft) 0 ft  Range of Motion/Exercises Active  Activity Response Tolerated well  Mobility Referral Yes  $Mobility charge 1 Mobility  Mobility Specialist Start Time (ACUTE ONLY) 1438  Mobility Specialist Stop Time (ACUTE ONLY) 1455  Mobility Specialist Time Calculation (min) (ACUTE ONLY) 17 min   Received in bed and agreed to mobility. Upon standing with Min A pt got very dizzy needing to sit back down. When sitting EOB pt BP read 122/86. Upon standing pt got dizzy again, BP standing read 86/54.  Pt could not tolerate standing again and returned to bed with all needs met.  Marilynne Halsted Mobility Specialist

## 2022-12-13 NOTE — Plan of Care (Signed)
  Problem: Education: Goal: Knowledge of General Education information will improve Description: Including pain rating scale, medication(s)/side effects and non-pharmacologic comfort measures 12/13/2022 1956 by Dwyane Dee, RN Outcome: Progressing 12/13/2022 0848 by Dwyane Dee, RN Outcome: Progressing   Problem: Clinical Measurements: Goal: Diagnostic test results will improve 12/13/2022 1956 by Dwyane Dee, RN Outcome: Progressing 12/13/2022 0848 by Dwyane Dee, RN Outcome: Progressing Goal: Respiratory complications will improve Outcome: Progressing   Problem: Coping: Goal: Level of anxiety will decrease Outcome: Progressing   Problem: Pain Management: Goal: General experience of comfort will improve Outcome: Progressing   Problem: Safety: Goal: Ability to remain free from injury will improve 12/13/2022 1956 by Dwyane Dee, RN Outcome: Progressing 12/13/2022 0848 by Dwyane Dee, RN Outcome: Progressing   Problem: Activity: Goal: Risk for activity intolerance will decrease 12/13/2022 1956 by Dwyane Dee, RN Outcome: Not Progressing 12/13/2022 0848 by Dwyane Dee, RN Outcome: Progressing

## 2022-12-14 DIAGNOSIS — J690 Pneumonitis due to inhalation of food and vomit: Secondary | ICD-10-CM | POA: Diagnosis not present

## 2022-12-14 DIAGNOSIS — J9601 Acute respiratory failure with hypoxia: Secondary | ICD-10-CM | POA: Diagnosis not present

## 2022-12-14 LAB — CBC WITH DIFFERENTIAL/PLATELET
Abs Immature Granulocytes: 0.01 10*3/uL (ref 0.00–0.07)
Basophils Absolute: 0.1 10*3/uL (ref 0.0–0.1)
Basophils Relative: 2 %
Eosinophils Absolute: 0.1 10*3/uL (ref 0.0–0.5)
Eosinophils Relative: 3 %
HCT: 23.7 % — ABNORMAL LOW (ref 36.0–46.0)
Hemoglobin: 7.7 g/dL — ABNORMAL LOW (ref 12.0–15.0)
Immature Granulocytes: 0 %
Lymphocytes Relative: 28 %
Lymphs Abs: 0.7 10*3/uL (ref 0.7–4.0)
MCH: 34.4 pg — ABNORMAL HIGH (ref 26.0–34.0)
MCHC: 32.5 g/dL (ref 30.0–36.0)
MCV: 105.8 fL — ABNORMAL HIGH (ref 80.0–100.0)
Monocytes Absolute: 0.5 10*3/uL (ref 0.1–1.0)
Monocytes Relative: 18 %
Neutro Abs: 1.3 10*3/uL — ABNORMAL LOW (ref 1.7–7.7)
Neutrophils Relative %: 49 %
Platelets: 132 10*3/uL — ABNORMAL LOW (ref 150–400)
RBC: 2.24 MIL/uL — ABNORMAL LOW (ref 3.87–5.11)
RDW: 16.7 % — ABNORMAL HIGH (ref 11.5–15.5)
WBC: 2.7 10*3/uL — ABNORMAL LOW (ref 4.0–10.5)
nRBC: 0 % (ref 0.0–0.2)

## 2022-12-14 LAB — BASIC METABOLIC PANEL
Anion gap: 12 (ref 5–15)
BUN: 22 mg/dL (ref 8–23)
CO2: 26 mmol/L (ref 22–32)
Calcium: 8.2 mg/dL — ABNORMAL LOW (ref 8.9–10.3)
Chloride: 97 mmol/L — ABNORMAL LOW (ref 98–111)
Creatinine, Ser: 2.41 mg/dL — ABNORMAL HIGH (ref 0.44–1.00)
GFR, Estimated: 20 mL/min — ABNORMAL LOW (ref >60)
Glucose, Bld: 112 mg/dL — ABNORMAL HIGH (ref 70–99)
Potassium: 3.7 mmol/L (ref 3.5–5.1)
Sodium: 135 mmol/L (ref 135–145)

## 2022-12-14 MED ORDER — MIDODRINE HCL 5 MG PO TABS
5.0000 mg | ORAL_TABLET | Freq: Two times a day (BID) | ORAL | Status: DC
  Administered 2022-12-14 (×2): 5 mg via ORAL
  Filled 2022-12-14 (×2): qty 1

## 2022-12-14 MED ORDER — MIDODRINE HCL 5 MG PO TABS
5.0000 mg | ORAL_TABLET | Freq: Three times a day (TID) | ORAL | Status: DC
  Administered 2022-12-15 – 2022-12-16 (×5): 5 mg via ORAL
  Filled 2022-12-14 (×5): qty 1

## 2022-12-14 MED ORDER — MIDODRINE HCL 5 MG PO TABS: 5.0000 mg | ORAL_TABLET | Freq: Three times a day (TID) | ORAL | Status: DC

## 2022-12-14 MED ORDER — APIXABAN 5 MG PO TABS
5.0000 mg | ORAL_TABLET | Freq: Two times a day (BID) | ORAL | Status: DC
  Administered 2022-12-14 – 2022-12-16 (×4): 5 mg via ORAL
  Filled 2022-12-14 (×4): qty 1

## 2022-12-14 MED ORDER — SODIUM CHLORIDE 0.9 % IV SOLN: INTRAVENOUS | Status: DC

## 2022-12-14 NOTE — Progress Notes (Signed)
PROGRESS NOTE    Stacey [name]  OZH:[id number] DOB: 11/01/1940 DOA: 12/04/2022 PCP: [name], MD   Brief Narrative:  82 y.o. female with medical history significant of metastaic breast ca for which patient is on  ibrance. Patient was discharged from the [facility] system on October 18, approximately 2 weeks ago after treatment for sepsis secondary to pneumonia that was present on admission affecting the right lower lobe. Overall she received 7 days of antibiotics.  She initially felt better but then she began to have shortness of breath generalized weakness. Patient does have chronic kidney disease stage IIIb and also has chronic diarrhea that has been attributed to [place] use for which she takes Imodium at home.    Today, pt denies any new complaints. Noted to be orthostatic positive and very deconditioned. Denies any new complaints.      Assessment & Plan:   Principal Problem:   Pneumonia Active Problems:   Anemia   HTN (hypertension)   Recurrent cancer of left breast (HCC)   Metabolic acidosis   Hypocalcemia   Fatigue   Immunosuppression due to chronic steroid use (HCC)   DVT (deep venous thrombosis) (HCC)   Diarrhea   Acute respiratory failure with hypoxia (HCC)   Acute hypoxic respiratory failure secondary to ?HCAP Mucous plugging/recurrent aspiration Currently on 2L of O2 Completed a course of IV antibiotics 5 days duration on 12/08/2022. Repeat chest x-ray on 12/08/2022 with development of small bilateral pleural effusions and bibasilar airspace disease, most likely atelectasis. Infection or aspiration could look similar. CTA chest no pulmonary embolism.  Mild bilateral bronchial wall thickening consistent with bronchitis.  New clustered solid nodule seen in the lingula and unchanged clustered nodules and mucous plugging of the right lower lobe likely due to infection or aspiration.  Respiratory virus panel negative.  COVID negative. Flu was neg -2 weeks  ago Continue IS/Flutter valve/nebs/hypertonic saline PCCM consulted, appreciate recs PT/OT  Chronic distolic HF BNP 164 ECHO showed EF of 60-65%, grade 1 DD PCCM gave lasix prn, creatinine elevated, appeared to have diuresed some   AKI on CKD stage IIIb Noted acute urinary retention-appears to have resolved Received Lasix Renal ultrasound unremarkable CT renal stone unremarkable Nephrology consulted, likely from ATN, rec gentle IVF   Daily BMP  Orthostatic hypotension Noted to be positive  Increased midodrine Started gentle IVF by nephrology  History of DVT S/p Xarelto--> switched to PO eliquis due to worsening renal fxn  Chronic diarrhea  Secondary to Ibrance in addition to recent antibiotic use and multiple bowel movements Stool C. 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  Severe anxiety  Does not take anything at home, was started on Xanax as needed with Atarax.      Estimated body mass index is 25.38 kg/m as calculated from the following:   Height as of this encounter: 5\' 3"  (1.6 m).   Weight as of this encounter: 65 kg.  DVT prophylaxis: Eliquis code Status: Full code Family Communication: None at bedside Disposition Plan:  Status is: Inpatient Remains  inpatient appropriate because: Acute hypoxia   Consultants:  PCCM Nephrology  Procedures: None  Antimicrobials: Completed   Objective: Vitals:   12/13/22 2308 12/14/22 0405 12/14/22 0826 12/14/22  1259  BP: (!) 129/57 (!) 104/45  (!) 98/44  Pulse: 95 86  92  Resp: 20 18  (!) 22  Temp:  98.6 F (37 C)  98.6 F (37 C)  TempSrc:  Oral  Oral  SpO2: 98% 100% 96% 100%  Weight:      Height:        Intake/Output Summary (Last 24 hours) at 12/14/2022 1723 Last data filed at 12/14/2022 0930 Gross per 24 hour  Intake 240 ml  Output 500 ml  Net -260 ml   Filed Weights   12/05/22 1101 12/05/22 1245 12/06/22 0524  Weight: 61.2 kg 64.2 kg 65 kg    Examination: General: NAD  Cardiovascular: S1, S2 present Respiratory: Diminished BS b/l Abdomen: Soft, nontender, nondistended, bowel sounds present Musculoskeletal: No BLE edema Skin: Normal Psychiatry: Normal mood    Data Reviewed: I have personally reviewed following labs and imaging studies  CBC: Recent Labs  Lab 12/10/22 0419 12/11/22 0418 12/11/22 1259 12/12/22 0447 12/13/22 0415 12/14/22 0424  WBC 2.5* 2.6*  --  2.5* 2.5* 2.7*  NEUTROABS 1.7 1.7  --  1.5* 1.8 1.3*  HGB 8.3* 7.0* 7.2* 7.2* 7.4* 7.7*  HCT 25.7* 22.2* 22.8* 22.6* 23.0* 23.7*  MCV 106.6* 106.2*  --  107.6* 107.5* 105.8*  PLT 111* 119*  --  104* 109* 132*   Basic Metabolic Panel: Recent Labs  Lab 12/08/22 0921 12/09/22 0408 12/10/22 0414 12/10/22 0419 12/11/22 0418 12/12/22 0447 12/13/22 0415 12/14/22 0424  NA 143 137  --  136 138 137 138 135  K 3.8 4.4  --  4.3 3.9 4.0 3.9 3.7  CL 100 102  --  101 101 100 101 97*  CO2 30 28  --  25 28 27 27 26   GLUCOSE 142* 103*  --  109* 104* 92 105* 112*  BUN 15 17  --  22 23 22 22 22   CREATININE 1.68* 1.74*  --  1.77* 1.99* 2.15* 2.12* 2.41*  CALCIUM 6.9* 7.1*  --  7.4* 8.1* 8.3* 8.8* 8.2*  MG 1.2* 1.5* 1.9  --   --   --   --   --    GFR: Estimated Creatinine Clearance: 16.3 mL/min (A) (by C-G formula based on SCr of 2.41 mg/dL (H)). Liver Function Tests: Recent Labs  Lab 12/08/22 0921 12/09/22 0408  AST 21 17  ALT 13 11  ALKPHOS 50 40  BILITOT 0.7 0.5  PROT 6.2* 5.3*  ALBUMIN 2.7* 2.3*    No results for input(s): "LIPASE", "AMYLASE" in the last 168 hours. No results for input(s): "AMMONIA" in the last 168 hours. Coagulation Profile: No results for input(s): "INR", "PROTIME" in the last 168 hours.  Cardiac Enzymes: No results for input(s): "CKTOTAL", "CKMB", "CKMBINDEX", "TROPONINI" in the last 168 hours. BNP (last 3 results) No results for input(s): "PROBNP" in the last 8760 hours. HbA1C: No results for input(s): "HGBA1C" in the last 72 hours. CBG: No results for input(s): "GLUCAP" in the last 168 hours. Lipid Profile: No results for input(s): "CHOL", "HDL", "LDLCALC", "TRIG", "CHOLHDL", "LDLDIRECT" in the last 72 hours. Thyroid Function Tests: No results for input(s): "TSH", "T4TOTAL", "FREET4", "T3FREE", "THYROIDAB" in the last 72 hours.  Anemia Panel: No results for input(s): "VITAMINB12", "FOLATE", "FERRITIN", "TIBC", "IRON", "RETICCTPCT" in the last 72 hours.  Sepsis  Labs: No results for input(s): "PROCALCITON", "LATICACIDVEN" in the last 168 hours.   Recent Results (from the past 240 hour(s))  SARS Coronavirus 2 by RT PCR (hospital order, performed in [facility] hospital lab) *cepheid single result test* Anterior Nasal Swab     Status: None   Collection Time: 12/04/22  9:37 PM   Specimen: Anterior Nasal Swab  Result Value Ref Range Status   SARS Coronavirus 2 by RT PCR NEGATIVE NEGATIVE Final    Comment: (NOTE) SARS-CoV-2 target nucleic acids are NOT DETECTED.  The SARS-CoV-2 RNA is generally detectable in upper and lower respiratory specimens during the acute phase of infection. The lowest concentration of SARS-CoV-2 viral copies this assay can detect is 250 copies / mL. A negative result does not preclude SARS-CoV-2 infection and should not be used as the sole basis for treatment or other patient management decisions.  A negative result may occur with improper specimen collection / handling, submission of specimen other than nasopharyngeal swab,  presence of viral mutation(s) within the areas targeted by this assay, and inadequate number of viral copies (<250 copies / mL). A negative result must be combined with clinical observations, patient history, and epidemiological information.  Fact Sheet for Patients:   RoadLapTop.co.za  Fact Sheet for Healthcare Providers: http://kim-miller.com/  This test is not yet approved or  cleared by the [place] FDA and has been authorized for detection and/or diagnosis of SARS-CoV-2 by FDA under an Emergency Use Authorization (EUA).  This EUA will remain in effect (meaning this test can be used) for the duration of the COVID-19 declaration under Section 564(b)(1) of the Act, 21 U.S.C. section 360bbb-3(b)(1), unless the authorization is terminated or revoked sooner.  Performed at [facility], [address], [place], [place] [address]   Respiratory (~20 pathogens) panel by PCR     Status: None   Collection Time: 12/04/22  9:37 PM   Specimen: Anterior Nasal Swab; Respiratory  Result Value Ref Range Status   Adenovirus NOT DETECTED NOT DETECTED Final   Coronavirus 229E NOT DETECTED NOT DETECTED Final    Comment: (NOTE) The Coronavirus on the Respiratory Panel, DOES NOT test for the novel  Coronavirus (2019 nCoV)    Coronavirus HKU1 NOT DETECTED NOT DETECTED Final   Coronavirus NL63 NOT DETECTED NOT DETECTED Final   Coronavirus OC43 NOT DETECTED NOT DETECTED Final   Metapneumovirus NOT DETECTED NOT DETECTED Final   Rhinovirus / Enterovirus NOT DETECTED NOT DETECTED Final   Influenza A NOT DETECTED NOT DETECTED Final   Influenza B NOT DETECTED NOT DETECTED Final   Parainfluenza Virus 1 NOT DETECTED NOT DETECTED Final   Parainfluenza Virus 2 NOT DETECTED NOT DETECTED Final   Parainfluenza Virus 3 NOT DETECTED NOT DETECTED Final   Parainfluenza Virus 4 NOT DETECTED NOT DETECTED Final   Respiratory Syncytial Virus NOT  DETECTED NOT DETECTED Final   Bordetella pertussis NOT DETECTED NOT DETECTED Final   Bordetella Parapertussis NOT DETECTED NOT DETECTED Final   Chlamydophila pneumoniae NOT DETECTED NOT DETECTED Final   Mycoplasma pneumoniae NOT DETECTED NOT DETECTED Final    Comment: Performed at [facility], [address], [place], [place] [address]  C Difficile Quick Screen w PCR reflex     Status: None   Collection Time: 12/05/22 10:17 PM  Result Value Ref Range Status   C Diff antigen NEGATIVE NEGATIVE Final   C Diff toxin NEGATIVE NEGATIVE Final   C Diff interpretation No C. difficile detected.  Final    Comment: Performed at [facility], [address] [place], [place] [address]     Radiology Studies: CT RENAL STONE STUDY  Result Date: 12/13/2022 CLINICAL DATA:  Acute renal insufficiency with history of breast cancer. Evaluate for bladder outlet obstruction or hydronephrosis. EXAM: CT ABDOMEN AND PELVIS WITHOUT CONTRAST TECHNIQUE: Multidetector CT imaging of the abdomen and pelvis was performed following the standard protocol without IV contrast. RADIATION DOSE REDUCTION: This exam was performed according to the departmental dose-optimization program which includes automated exposure control, adjustment of the mA and/or kV according to patient size and/or use of iterative reconstruction technique. COMPARISON:  PET 11/13/2022. FINDINGS: Lower chest: Clear lung bases. Mild cardiomegaly with right coronary artery calcification. Small bilateral pleural effusions. Moderate hiatal hernia. Hepatobiliary: Hepatic cysts. A vague high right hepatic lobe 8 mm lesion on 10/02 may have been present on the prior PET. Measures greater than fluid density. 14 mm gallstone without acute cholecystitis or biliary duct dilatation. Pancreas: Normal, without mass or ductal dilatation. Spleen: Normal in size, without focal abnormality. Adrenals/Urinary Tract: Normal left adrenal gland. Mild right  adrenal nodularity is similar to 2015 and likely due to a small adenoma which does not require imaging follow-up. No renal calculi or hydronephrosis. No hydroureter or ureteric calculi. No bladder calculi. No significant bladder distention. Stomach/Bowel: Normal remainder of the stomach. Extensive colonic diverticulosis. Normal terminal ileum. Normal small bowel. Vascular/Lymphatic: Advanced aortic and branch vessel atherosclerosis. No abdominopelvic adenopathy. Reproductive: Hysterectomy.  No adnexal mass. Other: No significant free fluid.  Mild pelvic floor laxity. Musculoskeletal: Osteopenia. Trace L3-4 anterolisthesis. Convex right lumbar spine curvature. IMPRESSION: 1. No hydronephrosis, bladder distension or explanation for renal failure. 2. Small bilateral pleural effusions. 3. Moderate hiatal hernia 4. Incidental findings, including: Coronary artery atherosclerosis. Aortic Atherosclerosis (ICD10-I70.0). Cholelithiasis. Pelvic floor laxity. 5. Possible 8 mm high right hepatic lobe lesion. Given absence of hypermetabolism in this area on recent PET, felt unlikely to represent metastasis. This can be re-evaluated on routine follow-up. Electronically Signed   By: Jeronimo Greaves M.D.   On: 12/13/2022 17:24     Scheduled Meds:  anastrozole  1 mg Oral Daily   apixaban  5 mg Oral BID   arformoterol  15 mcg Nebulization BID   aspirin  81 mg Oral Daily   budesonide (PULMICORT) nebulizer solution  0.25 mg Nebulization BID   dextromethorphan-guaiFENesin  1 tablet Oral BID   hydrOXYzine  10 mg Oral TID   influenza vaccine adjuvanted  0.5 mL Intramuscular Tomorrow-1000   midodrine  5 mg Oral BID WC   pantoprazole  40 mg Oral BID   sodium chloride flush  3 mL Intravenous Q12H   Continuous Infusions:  sodium chloride 85 mL/hr at 12/14/22 1538      LOS: 10 days      Briant Cedar, MD  12/14/2022, 5:23 PM

## 2022-12-14 NOTE — Progress Notes (Signed)
Physical Therapy Treatment Patient Details Name: Stacey [name] MRN: [id number] DOB: December 09, 1940 Today's Date: 12/14/2022   History of Present Illness Pt is an 82 yo female with recent admission on 11/17/22 for sepsis, and pt discharged home.  Pt admitted again on 12/04/22 for acute hypoxic respiratory failure secondary to aspiration pneumonia.  Pt with hx including but not limited to breast CA with bone mets on chronic immunotherapy, L UE lymphedema, OA, DVT    PT Comments  Pt admitted with above diagnosis.  Pt currently with functional limitations due to the deficits listed below (see PT Problem List). Pt in bed when PT arrived. Pt agreeable to therapy intervention. Pt states she feels like she has not gotten any PT since she has been in hospital with the exception of walking with mobility specialist yesterday. Pt sated after she went walking her Bp bottomed out. PT assessed for orthostatic hypotension and with the exception of 3 min stand with pt reporting symptoms and unable to maintain > 47s. Please see below for Bp findings. Pt required S for supine to sit, CGA for sit to stand  and SPC and for balance at RW. Pt unable to safely progress with gait assessment today. Pt left seated in recliner and all needs in place, pt ed provided on use of call bell and awaiting staff assist prior to getting up and encouraged to remain OOB. Pt will benefit from acute skilled PT to increase their independence and safety with mobility to allow discharge.   Bp semi reclined in bed 102/57 (93 PR) Bp seated EOB 88/49 (106 PR) Bp immediate standing 87/66 (110 PR) Bp once seated in recliner 99/43 (115 PR) Bp seated in recliner for 3 mins 98/60 (101 PR) O2 saturation on 2 L/min 90%    If plan is discharge home, recommend the following: Assistance with cooking/housework;Help with stairs or ramp for entrance;A little help with walking and/or transfers;A little help with bathing/dressing/bathroom   Can travel by private  vehicle     No  Equipment Recommendations  None recommended by PT    Recommendations for Other Services Rehab consult     Precautions / Restrictions Precautions Precautions: Fall Precaution Comments: monitor sats, chronic diarrhea, urinary urgency, orthostatic hypotension Restrictions Weight Bearing Restrictions: No     Mobility  Bed Mobility Overal bed mobility: Needs Assistance Bed Mobility: Supine to Sit     Supine to sit: HOB elevated, Used rails, Supervision     General bed mobility comments: pt required min cues and HOB elevated for supine to sit EOB.    Transfers Overall transfer level: Needs assistance Equipment used: Rolling walker (2 wheels) Transfers: Sit to/from Stand, Bed to chair/wheelchair/BSC Sit to Stand: Contact guard assist Stand pivot transfers: Contact guard assist         General transfer comment: pt reported feeling dizzy when transitioning into standing and able to maintain static standing at RW for 47s wtih Bp asessed. pt required CGA with RW for SPC bed to recliner and pt encouraged to remain OOB    Ambulation/Gait               General Gait Details: NT due to reports of dizziness and positive findings for orthostatic hypotension   Stairs             Wheelchair Mobility     Tilt Bed    Modified Rankin (Stroke Patients Only)       Balance Overall balance assessment: Needs assistance Sitting-balance support: No upper  extremity supported Sitting balance-Leahy Scale: Good     Standing balance support: Single extremity supported, Reliant on assistive device for balance Standing balance-Leahy Scale: Poor Standing balance comment: B UE support at RW and CGA                            Cognition Arousal: Alert Behavior During Therapy: WFL for tasks assessed/performed Overall Cognitive Status: Within Functional Limits for tasks assessed                                           Exercises      General Comments General comments (skin integrity, edema, etc.): 2 L/min and pt ehibited signs and symptoms of SOB with cues for pursed lip breathing and O2 saturation 100%      Pertinent Vitals/Pain Pain Assessment Pain Assessment: No/denies pain Faces Pain Scale: No hurt    Home Living                          Prior Function            PT Goals (current goals can now be found in the care plan section) Acute Rehab PT Goals Patient Stated Goal: return home PT Goal Formulation: With patient Time For Goal Achievement: 12/20/22 Potential to Achieve Goals: Good Progress towards PT goals: Progressing toward goals    Frequency    Min 1X/week      PT Plan      Co-evaluation              AM-PAC PT "6 Clicks" Mobility   Outcome Measure  Help needed turning from your back to your side while in a flat bed without using bedrails?: A Little Help needed moving from lying on your back to sitting on the side of a flat bed without using bedrails?: A Little Help needed moving to and from a bed to a chair (including a wheelchair)?: A Little Help needed standing up from a chair using your arms (e.g., wheelchair or bedside chair)?: A Little Help needed to walk in hospital room?: A Lot Help needed climbing 3-5 steps with a railing? : A Lot 6 Click Score: 16    End of Session Equipment Utilized During Treatment: Oxygen;Gait belt Activity Tolerance: Treatment limited secondary to medical complications (Comment) (hypotension) Patient left: with call bell/phone within reach;in chair Nurse Communication: Mobility status PT Visit Diagnosis: Other abnormalities of gait and mobility (R26.89);Muscle weakness (generalized) (M62.81)     Time: 1610-9604 PT Time Calculation (min) (ACUTE ONLY): 28 min  Charges:    $Therapeutic Activity: 23-37 mins PT General Charges $$ ACUTE PT VISIT: 1 Visit                     Johnny Bridge, PT Acute Rehab    Jacqualyn Posey 12/14/2022, 2:25 PM

## 2022-12-14 NOTE — Progress Notes (Signed)
NAME:  Stacey [name], MRN:  [id number], DOB:  Mar 28, 1940, LOS: 10 ADMISSION DATE:  12/04/2022, CONSULTATION DATE:  11/7 REFERRING MD:  [name], CHIEF COMPLAINT:  recurrent PNA/resp failure    History of Present Illness:  82 year old female w/ metastatic breast CA (stage IV LN and bone), currently on Ibrace M-F, fulvestrant q28d and arimidex  Recently dc'd from hospital 10/18 after being treated for PNA (NOS). Completed abx at home but still had some shortness of breath and fatigue (with mix of both progressing). She denied fever, chest pain, vomiting, syncope. Did endorse dry non-productive cough. Her weakness progressed to point she could not get OOB so EMS was called on 11/1. Initial CXR negative for acute disease. F/u Ct chest neg for PE but did show subpleural chronic changes, bilateral bronchial wall thickening. New solid nodules in lingula and mucous plugging or RLL. Also marked kyphosis, mod hiatal hernia and emphysema changes.  She was placed on supplemental oxygen, IV unasyn to cover for aspiration, and IV hydration. Since admit she completed 5 d of abx. Had a swallow eval which was neg for aspiration but continues to require supplemental oxygen. PCCM asked to see because of her recurrent shortness of breath and on-going O2 needs.   Pertinent  Medical History  Metastatic breast CA (stage IV LN and bone), currently on Ibrace M-F, fulvestrant q28d and arimidex  HFpEF H/o DVT Chronic diarrhea  CKD stage IIIb Chronic anemia  Severe anxiety  5 mm pulm nodule  Significant Hospital Events: Including procedures, antibiotic start and stop dates in addition to other pertinent events   11/1 admitted placed on abx 11/6 completed abx 11/7 pulm asked to see for on-going hypoxia   Interim History / Subjective:  No overnight issues. On 2LNC. Had episode of dizziness and low BP when standing yesterday.   Objective   Blood pressure (!) 104/45, pulse 86, temperature 98.6 F (37 C), temperature  source Oral, resp. rate 18, height 5\' 3"  (1.6 m), weight 65 kg, SpO2 96%.    FiO2 (%):  [28 %] 28 %   Intake/Output Summary (Last 24 hours) at 12/14/2022 7253 Last data filed at 12/14/2022 0016 Gross per 24 hour  Intake 360 ml  Output 1300 ml  Net -940 ml   Filed Weights   12/05/22 1101 12/05/22 1245 12/06/22 0524  Weight: 61.2 kg 64.2 kg 65 kg    Examination: Frail, elderly, sitting up in bed Bibasilar crackles with improved breath sounds compared to Friday RRR No peripheral edema Thin skin  Resolved Hospital Problem list     Assessment & Plan:  Acute hypoxic respiratory failure  Kyphosis  Bilateral atelectasis  Mucous plugging  Hiatal hernia  Severe reflux Recurrent aspiration  Bilateral free flowing pleural effusions Pulmonary nodules Stage IV breast CA HFpEF and Grade I diastolic dysfxn H/o DVT Anemia  Thrombocytopenia  CKD stage IIIB  Pulmonary problems: Acute hypoxemic respiratory failure Kyphosis with restrictive physiology Hiatal hernia with gerd and patulous esophagus Bibasilar pleural effusions with compressive atelectasis Physical deconditioning   - transition to oral lasix - continue IS, OOB, nebulizers as needed - no objective to discharge from pulmonary perspective to rehab.   Pulmonary nodules Plan Query metastatic disease. She will follow up with oncology for repeat scanxs.   Pccm will sign off.   Durel Salts, MD Pulmonary and Critical Care Medicine [facility] 12/14/2022 9:52 AM Pager: see AMION  If no response to pager, please call critical care on call (see AMION) until  7pm After 7:00 pm call Elink

## 2022-12-14 NOTE — TOC Progression Note (Signed)
Transition of Care [facility]) - Progression Note    Patient Details  Name: Stacey [name] MRN: [id number] Date of Birth: 09/14/1940  Transition of Care [facility]) CM/SW Contact  Larrie Kass, LCSW Phone Number: 12/14/2022, 2:03 PM  Clinical Narrative:     CSW met with pt at bed side to follow up about conversation on the 11/5 concerning bed choice. Pt was is very anxious about her d/c plan. CSW reminded pt she is only waiting to be medically stable enough to transfer to [facility]. CSW inquired if she would like CSW to discuss with her daughter. Pt's stated she would informed her daughter. CSW left contact information in case pt has additional questions. TOC to follow.   Expected Discharge Plan: Skilled Nursing Facility Barriers to Discharge: Continued Medical Work up  Expected Discharge Plan and Services       Living arrangements for the past 2 months: Single Family Home                                       Social Determinants of Health (SDOH) Interventions SDOH Screenings   Food Insecurity: No Food Insecurity (12/05/2022)  Housing: Low Risk  (12/06/2022)  Transportation Needs: No Transportation Needs (12/06/2022)  Utilities: Not At Risk (12/06/2022)  Alcohol Screen: Low Risk  (11/07/2022)  Depression (PHQ2-9): Low Risk  (11/10/2022)  Financial Resource Strain: Low Risk  (11/07/2022)  Physical Activity: Inactive (11/07/2022)  Social Connections: Socially Isolated (11/07/2022)  Stress: No Stress Concern Present (11/07/2022)  Tobacco Use: Medium Risk (12/04/2022)  Health Literacy: Adequate Health Literacy (11/10/2022)    Readmission Risk Interventions    11/19/2022    1:54 PM  Readmission Risk Prevention Plan  Transportation Screening Complete  PCP or Specialist Appt within 5-7 Days Complete  Home Care Screening Complete  Medication Review (RN CM) Complete

## 2022-12-14 NOTE — Consult Note (Signed)
Renal Service Consult Note [facility]  Stacey [name] 12/14/2022 [name], MD Requesting Physician: Dr. [name]  Reason for Consult: Renal failure HPI: The patient is a 82 y.o. year-old w/ PMH as below who presented to ED 11/01 c/o SOB, tremors and chills. Pt has hx of metastatic breast cancer stage IV on arimidex and Ibrance. Was dc'd 2 wks ago after admission for sepsis and PNA. H/o CKA, chronic diarrhea. EMS found pt to be hypoxic. Pt was admitted for AHRF and possible HCAP. CTA chest showed no PE, mild bronchitis, RLL mucous plugging due to aspiration or pna. Pt was given IV abx. Creatinine was 1.2 on admit 11/02, then bumped to 1.4 on 11/03, 1.7 on 11/06, 2.1 on 11/09 and 2.4 today 11/11. We are asked to see for renal failure.    Pt seen in room.  Denies any sig SOB, leg swelling. +chronic diarrhea. Not on chemoRx but takes hormone shot and Ibrance.    ROS - denies CP, no joint pain, no HA, no blurry vision, no rash, no diarrhea, no nausea/ vomiting   Past Medical History  Past Medical History:  Diagnosis Date   ANEMIA-NOS    ANXIETY    Breast cancer (HCC) 1997 L, 2012 R   s/p chemo/xrt   COPD    resolved   DIVERTICULOSIS, COLON 2008   Dizziness    Family history of breast cancer    Family history of lung cancer    Family history of lymphoma    Family history of pancreatic cancer    Family history of uterine cancer    GERD    Hx of radiation therapy 10/19/11 -12/03/11   right breast   HYPERLIPIDEMIA    IRRITABLE BOWEL SYNDROME, HX OF    Left-sided carotid artery disease (HCC)    moderate left ICA stenosis   OSTEOARTHRITIS, HAND    PSVT (paroxysmal supraventricular tachycardia) (HCC)    symptomatic on event monitor   Past Surgical History  Past Surgical History:  Procedure Laterality Date   ABDOMINAL HYSTERECTOMY     APPENDECTOMY     BREAST BIOPSY  11/14/10    r breast: inv, insitu mammary carcinoma w/calcif, er/pr +, her2 -   BREAST  SURGERY     lumpectomy   CATARACT EXTRACTION     both eyes   ELECTROPHYSIOLOGIC STUDY N/A 05/10/2015   Procedure: SVT Ablation;  Surgeon: Will Jorja Loa, MD;  Location: [facility] INVASIVE CV LAB;  Service: Cardiovascular;  Laterality: N/A;   HERNIA REPAIR     inguinal herniorrhapy  1984   left   IR US GUIDE BX ASP/DRAIN  05/26/2019   rectal fissure repair     s/p benign breast biopsy  2003   right   s/p left foot surgury  2009   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   spiral fx left foot  2008   no surgury   TMJ ARTHROPLASTY  1989   TONGUE SURGERY     1988- to remove scar tissue growth    TONSILLECTOMY     Family History  Family History  Problem Relation Age of Onset   Hypertension Mother    Stroke Mother    Colon polyps Mother    Diabetes Mother    Pancreatic cancer Mother 44   Uterine cancer Mother 17   Lymphoma Brother        burkitts   Lung cancer Paternal Uncle    Lung cancer Maternal Grandmother  63       non-smoker   Lung cancer Maternal Grandfather    Breast cancer Cousin        maternal cousin, dx in her mid 93s   Brain cancer Cousin        maternal cousin's son; dx in his 29s   Testicular cancer Cousin        maternal cousin's son;    Breast cancer Cousin        paternal cousin; dx in her 25s   Breast cancer Cousin        paternal cousin's daughter; dx in 65s; neg genetic testing   Colon cancer Neg Hx    Esophageal cancer Neg Hx    Stomach cancer Neg Hx    Rectal cancer Neg Hx    Social History  reports that she quit smoking about 6 years ago. Her smoking use included cigarettes. She started smoking about 60 years ago. She has a 54 pack-year smoking history. She has never used smokeless tobacco. She reports current alcohol use. She reports that she does not use drugs. Allergies  Allergies  Allergen Reactions   Other Nausea Only and Other (See Comments)    A lot of antibiotics cause extreme nausea   Tape Other (See Comments)    PAPER TAPE IS MUCH  PREFERRED   Aminoglycosides Other (See Comments)    Unknown reaction - pt is not sure where this entry came from   Bacitracin Other (See Comments)    Reaction not recalled   Bee Venom Swelling and Other (See Comments)    Severe body swelling   Cephalexin Nausea Only   Clindamycin/Lincomycin Diarrhea and Nausea And Vomiting   Codeine Nausea And Vomiting and Other (See Comments)    Pt can take codeine cough syrup   Home medications Prior to Admission medications   Medication Sig Start Date End Date Taking? Authorizing Provider  acetaminophen (TYLENOL) 500 MG tablet Take 1,000 mg by mouth every 8 (eight) hours as needed for mild pain (pain score 1-3), headache or fever.   Yes [provider]  aspirin 81 MG EC tablet Take 81 mg by mouth every morning.   Yes [provider]  CALCIUM CARBONATE-VITAMIN D PO Take 1 tablet by mouth 2 (two) times daily.   Yes [provider]  Cholecalciferol (VITAMIN D3 PO) Take 1 capsule by mouth every morning.   Yes [provider]  diphenhydrAMINE (BENADRYL) 25 MG tablet Take 25 mg by mouth every 6 (six) hours as needed (for bee stings).    Yes [provider]  fulvestrant (FASLODEX) 250 MG/5ML injection Inject 250 mg into the muscle every 28 (twenty-eight) days. 11/23/19  Yes [provider]  loperamide (IMODIUM) 2 MG capsule Take 2-4 mg by mouth See admin instructions. Take 4 mg by mouth after the first loose stool, then 2 mg for each subsequent one. Cannot exceed a sum total of 16 mg/24 hours. Stop if there are no loose stools after 12 hours. 09/27/20  Yes Magrinat, Valentino Hue, MD  loratadine (CLARITIN) 10 MG tablet Take 10 mg by mouth every morning.   Yes [provider]  Multiple Vitamin (MULTIVITAMIN WITH MINERALS) TABS tablet Take 1 tablet by mouth every morning. Centrum   Yes [provider]  omeprazole (PRILOSEC) 20 MG capsule Take 1 capsule by mouth daily Patient taking differently: Take  20 mg by mouth daily before breakfast. Take 1 capsule by mouth daily 09/14/22  Yes [name], MD  palbociclib (IBRANCE) 100 MG tablet Take 1 tablet (100 mg total) by mouth Monday through Friday. Take as directed by MD. Patient taking differently: Take 100 mg by mouth See admin instructions. Take 100 mg by mouth Monday through Friday for 3 consecutive weeks, then OFF for 1 week. Repeat. Take as directed by MD. 03/11/22  Yes Rachel Moulds, MD  telmisartan (MICARDIS) 40 MG tablet Take 1 tablet (40 mg total) by mouth every morning. 09/14/22  Yes [name], MD  triamcinolone (NASACORT) 55 MCG/ACT AERO nasal inhaler Place 2 sprays into the nose daily. 2 sprays each nostril at night before bedtime Patient taking differently: Place 2 sprays into the nose See admin instructions. Instill 2 sprays into each nostril every night before bedtime 10/14/20  Yes Drema Halon, MD  vitamin B-12 (CYANOCOBALAMIN) 100 MCG tablet Take 100 mcg by mouth daily.   Yes [provider]  zolpidem (AMBIEN) 10 MG tablet Take 1 tablet (10 mg total) by mouth at bedtime as needed for sleep. Patient taking differently: Take 5-10 mg by mouth at bedtime as needed for sleep. 10/07/22  Yes [name], MD  anastrozole (ARIMIDEX) 1 MG tablet TAKE 1 TABLET(1 MG) BY MOUTH DAILY Patient taking differently: Take 1 mg by mouth daily. 09/21/22   Loa Socks, NP  dexamethasone (DECADRON) 4 MG tablet Take 0.5 tablets (2 mg total) by mouth daily. Patient not taking: Reported on 12/04/2022 10/23/22   Loa Socks, NP  Magnesium 500 MG CAPS Take 500 mg by mouth every morning. Take with a 250 mg capsule for a total dose of 750 mg daily Patient not taking: Reported on 12/04/2022    [provider]  MAGNESIUM PO Take 250 mg by mouth every morning. Take with a 500 mg capsule for a total dose of 750 mg daily Patient not taking: Reported on 12/04/2022    [provider]  prochlorperazine  (COMPAZINE) 10 MG tablet Take 1 tablet (10 mg total) by mouth every 6 (six) hours as needed for nausea or vomiting. Patient not taking: Reported on 12/04/2022 06/26/19   Magrinat, Valentino Hue, MD  rivaroxaban (XARELTO) 20 MG TABS tablet Take 1 tablet (20 mg total) by mouth daily with supper. Patient not taking: Reported on 12/04/2022 05/13/22   Loa Socks, NP     Vitals:   12/13/22 2308 12/14/22 0405 12/14/22 0826 12/14/22 1259  BP: (!) 129/57 (!) 104/45  (!) 98/44  Pulse: 95 86  92  Resp: 20 18  (!) 22  Temp:  98.6 F (37 C)  98.6 F (37 C)  TempSrc:  Oral  Oral  SpO2: 98% 100% 96% 100%  Weight:      Height:       Exam Gen alert, no distress, [place] O2 No rash, cyanosis or gangrene Sclera anicteric, throat clear  No jvd or bruits Chest clear bilat to bases, no rales/ wheezing RRR no RG Abd soft ntnd no mass or ascites +bs GU defer MS no joint effusions or deformity Ext no LE or UE edema, no wounds or ulcers Neuro is alert, Ox 3 , nf      Renal-related home meds: - prilosec - telmisartan   Date   Creat  eGFR  2008- 2020  0.60- 0.95  2021   0.93- 1.24  2022   1.16- 1.47  2023   1.12- 1.52  Jan- jul 2024  1.11- 1.53   Aug- oct 2024  1.09- 1.54 34- 51 ml/min, 3b  11/001- 12/14/22 1.13- 2.41    Pt rec'd IV contrast 75 cc w/ CTA chest on 11/01.  BP's on admission were wnl. Since 11/08 has had some soft BP's off and on, lowest in the 90s O2 requirement was 2 L [place] on admission, peak 5 L [place] on 11/07 and down to 2 L [place] again today I/O's total = 8.5 L in and 9.3 L out = net neg 0.7 L  Wts are not up to date Po/ IV meds of interest:   IV lasix 11/08- 11/08  Midodrine 2.5 2-3 x per day last 5 days  IV unasyn 11/02 - 11/05  No acei/ ARB/ nsaids       UA 11/2 - sg 1.046, large LE, rare bact, 0-5 rbc/epi, 11-20 wbc   CT renal stone 11/10- Urinary Tract: no renal calculi or hydronephrosis. No hydroureter or ureteric calculi. No bladder calculi. No significant bladder  distention.       Assessment/ Plan: AKI on CKD 3b - b/l creatinine 1.1- 1.5 from aug- oct 2024, eGFR 34- 51 ml/min. Creat here was 1.4 on admission in the setting of suspected PNA and AHRF.  Noncontrast CT 11/10 showed normal kidneys w/o obstruction, also renal US on 11/05 w/o obstruction.  UA on 11/2 was unremarkable. Pt had IV contrast exposure on 11/01 w/ CTA chest w/ rise of creatinine up to 2.4 today. Good UOP, no other nephrotoxins, no sig hypotension. Suspect AKI due to ATN from IV contrast +/- vol depletion related to diarrhea + home ARB (on hold here). Recommend IVF"s and f/u creatinine in am. Will follow.  AHRF/ suspected HCAP - sp 5 day course of IV abx Stage IV metastatic breast cancer Chronic diarrhea    Vinson Moselle  MD [facility] 12/14/2022, 1:08 PM  Recent Labs  Lab 12/08/22 0921 12/09/22 0408 12/10/22 0419 12/13/22 0415 12/14/22 0424  HGB 10.2* 8.7*   < > 7.4* 7.7*  ALBUMIN 2.7* 2.3*  --   --   --   CALCIUM 6.9* 7.1*   < > 8.8* 8.2*  CREATININE 1.68* 1.74*   < > 2.12* 2.41*  K 3.8 4.4   < > 3.9 3.7   < > = values in this interval not displayed.   Inpatient medications:  anastrozole  1 mg Oral Daily   arformoterol  15 mcg Nebulization BID   aspirin  81 mg Oral Daily   budesonide (PULMICORT) nebulizer solution  0.25 mg Nebulization BID   dextromethorphan-guaiFENesin  1 tablet Oral BID   hydrOXYzine  10 mg Oral TID   influenza vaccine adjuvanted  0.5 mL Intramuscular Tomorrow-1000   midodrine  5 mg Oral BID WC   pantoprazole  40 mg Oral BID   rivaroxaban  20 mg Oral Q supper   sodium chloride flush  3 mL Intravenous Q12H    acetaminophen **OR** acetaminophen, albuterol, ALPRAZolam, loperamide, mouth rinse, polyethylene glycol, zolpidem

## 2022-12-14 NOTE — Consult Note (Signed)
[facility] Liaison Note  12/14/2022  ALANDRA BARA 10-Mar-1940 [id number]  Covering Charlesetta Shanks, RN [facility] hospital liaison)  Location: [facility] Liaison screened the patient remotely at [facility].  Insurance: Medicare   Stacey [name] is a 82 y.o. female who is a Primary Care Patient of [name], MD-Loda Arp at [facility]. The patient was screened for  readmission hospitalization with noted high risk score for unplanned readmission risk with 2 IP in 6 months.  The patient was assessed for potential Care Management service needs for post hospital transition for care coordination. Review of patient's electronic medical record reveals patient was admitted with Pneumonia. Pt recommended for SNF for ongoing rehabilitation. Chart reveal recommendations for SNF level of care to [facility] when pt is medical stable. Will collaborate with PAC-RN upon pt's discharge from the hospital to the networking SNF if [facility] remains the choice of placement.  Plan: [facility] Liaison will continue to follow progress and disposition to asess for post hospital community care coordination/management needs.  Referral request for community care coordination: pending disposition.   VBCI Care Management/Population Health does not replace or interfere with any arrangements made by the Inpatient Transition of Care team.   For questions contact:   Elliot Cousin, RN, [facility] Liaison [place]   [facility], Population Health Office Hours MTWF  8:00 am-6:00 pm Direct Dial: [phone] mobile [phone] [Office toll free line] Office Hours are M-F 8:30 - 5 pm [email or web address]

## 2022-12-14 NOTE — Progress Notes (Signed)
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 Given worsening renal function, anticoagulation being transitioned to apixaban. Discussed with TRH, oncology, and patient.   Cindi Carbon, PharmD 12/14/22 2:39 PM

## 2022-12-15 ENCOUNTER — Other Ambulatory Visit (HOSPITAL_COMMUNITY): Payer: Self-pay

## 2022-12-15 DIAGNOSIS — J690 Pneumonitis due to inhalation of food and vomit: Secondary | ICD-10-CM | POA: Diagnosis not present

## 2022-12-15 DIAGNOSIS — J9601 Acute respiratory failure with hypoxia: Secondary | ICD-10-CM | POA: Diagnosis not present

## 2022-12-15 LAB — CBC WITH DIFFERENTIAL/PLATELET
Abs Immature Granulocytes: 0.02 10*3/uL (ref 0.00–0.07)
Basophils Absolute: 0.1 10*3/uL (ref 0.0–0.1)
Basophils Relative: 2 %
Eosinophils Absolute: 0.1 10*3/uL (ref 0.0–0.5)
Eosinophils Relative: 3 %
HCT: 23.3 % — ABNORMAL LOW (ref 36.0–46.0)
Hemoglobin: 7.3 g/dL — ABNORMAL LOW (ref 12.0–15.0)
Immature Granulocytes: 1 %
Lymphocytes Relative: 23 %
Lymphs Abs: 0.6 10*3/uL — ABNORMAL LOW (ref 0.7–4.0)
MCH: 34 pg (ref 26.0–34.0)
MCHC: 31.3 g/dL (ref 30.0–36.0)
MCV: 108.4 fL — ABNORMAL HIGH (ref 80.0–100.0)
Monocytes Absolute: 0.5 10*3/uL (ref 0.1–1.0)
Monocytes Relative: 20 %
Neutro Abs: 1.3 10*3/uL — ABNORMAL LOW (ref 1.7–7.7)
Neutrophils Relative %: 51 %
Platelets: 154 10*3/uL (ref 150–400)
RBC: 2.15 MIL/uL — ABNORMAL LOW (ref 3.87–5.11)
RDW: 16.5 % — ABNORMAL HIGH (ref 11.5–15.5)
WBC: 2.6 10*3/uL — ABNORMAL LOW (ref 4.0–10.5)
nRBC: 0 % (ref 0.0–0.2)

## 2022-12-15 LAB — RENAL FUNCTION PANEL
Albumin: 2.6 g/dL — ABNORMAL LOW (ref 3.5–5.0)
Anion gap: 11 (ref 5–15)
BUN: 21 mg/dL (ref 8–23)
CO2: 24 mmol/L (ref 22–32)
Calcium: 7.7 mg/dL — ABNORMAL LOW (ref 8.9–10.3)
Chloride: 102 mmol/L (ref 98–111)
Creatinine, Ser: 1.86 mg/dL — ABNORMAL HIGH (ref 0.44–1.00)
GFR, Estimated: 27 mL/min — ABNORMAL LOW (ref >60)
Glucose, Bld: 112 mg/dL — ABNORMAL HIGH (ref 70–99)
Phosphorus: 3.4 mg/dL (ref 2.5–4.6)
Potassium: 3.3 mmol/L — ABNORMAL LOW (ref 3.5–5.1)
Sodium: 137 mmol/L (ref 135–145)

## 2022-12-15 LAB — MAGNESIUM: Magnesium: 1 mg/dL — ABNORMAL LOW (ref 1.7–2.4)

## 2022-12-15 LAB — SODIUM, URINE, RANDOM: Sodium, Ur: 44 mmol/L

## 2022-12-15 LAB — CREATININE, URINE, RANDOM: Creatinine, Urine: 89 mg/dL

## 2022-12-15 MED ORDER — MAGNESIUM SULFATE 4 GM/100ML IV SOLN
4.0000 g | Freq: Once | INTRAVENOUS | Status: AC
  Administered 2022-12-15: 4 g via INTRAVENOUS
  Filled 2022-12-15: qty 100

## 2022-12-15 MED ORDER — SODIUM CHLORIDE 0.9 % IV SOLN: INTRAVENOUS | Status: AC

## 2022-12-15 MED ORDER — POTASSIUM CHLORIDE CRYS ER 20 MEQ PO TBCR
40.0000 meq | EXTENDED_RELEASE_TABLET | Freq: Once | ORAL | Status: AC
  Administered 2022-12-15: 40 meq via ORAL
  Filled 2022-12-15: qty 2

## 2022-12-15 NOTE — Progress Notes (Signed)
[facility] Progress Note  Subjective: no c/o's, creat down to 1.8 today Vitals:   12/14/22 2011 12/14/22 2029 12/15/22 0737 12/15/22 1222  BP:  (!) 106/53  (!) 106/52  Pulse:  93  89  Resp:  20  19  Temp:  98.6 F (37 C)  98.2 F (36.8 C)  TempSrc:  Oral  Oral  SpO2: 97% 100% 93% 97%  Weight:      Height:        Exam: Gen alert, no distress, [place] O2 No jvd or bruits Chest clear bilat to bases RRR no RG Abd soft ntnd no mass or ascites +bs Ext no LE or UE edema Neuro is alert, Ox 3 , nf       Renal-related home meds: - prilosec - telmisartan    Date                            Creat               eGFR  2008- 2020                 0.60- 0.95  2021                           0.93- 1.24  2022                           1.16- 1.47  2023                           1.12- 1.52  Jan- jul 2024              1.11- 1.53          Aug- oct 2024             1.09- 1.54        34- 51 ml/min, 3b  11/001- 12/14/22        1.13- 2.41       Pt rec'd IV contrast 75 cc w/ CTA chest on 11/01.  BP's on admission were wnl. Since 11/08 has had some soft BP's off and on, lowest in the 90s O2 requirement = 2 L [place] on admission, peaked 5 L [place] on 11/07, and now back on 2L  IP meds of interest:   IV lasix 11/08- 11/08  Midodrine 2.5 2-3 x per day last 5 days  IV unasyn 11/02 - 11/05  No acei/ ARB/ nsaids       UA 11/2 - sg 1.046, large LE, rare bact, 0-5 rbc/epi, 11-20 wbc   CT renal stone 11/10- Urinary Tract: no renal calculi or hydronephrosis. No hydroureter or ureteric calculi. No bladder calculi. No significant bladder distention.         Assessment/ Plan: AKI on CKD 3b - b/l creatinine 1.1- 1.5 from aug- oct 2024, eGFR 34- 51 ml/min. Creat here was 1.4 on admission in the setting of suspected PNA and AHRF.  Noncontrast CT 11/10 showed normal kidneys w/o obstruction, also renal US on 11/05 w/o obstruction.  UA on 11/2 was unremarkable. Pt had IV contrast exposure on 11/01 CT w/  gradual of creatinine up to 2.4 yesterday. AKI suspected due to ATN from IV contrast +/- vol depletion related to diarrhea +/- home ARB (on hold here). Recommendation was for IVF's. She  rec'd IVF's overnight and creat down to 1.8 this am. Will lower IVF"s to 75 cc/hr and recheck labs in am tomorrow. If creat stable or better, pt would be okay for discharge tomorrow.  AHRF/ suspected HCAP - sp 5 day course of IV abx Stage IV metastatic breast cancer Chronic diarrhea       Vinson Moselle MD  [facility] 12/15/2022, 12:40 PM  Recent Labs  Lab 12/09/22 0408 12/10/22 0419 12/14/22 0424 12/15/22 0412  HGB 8.7*   < > 7.7* 7.3*  ALBUMIN 2.3*  --   --  2.6*  CALCIUM 7.1*   < > 8.2* 7.7*  PHOS  --   --   --  3.4  CREATININE 1.74*   < > 2.41* 1.86*  K 4.4   < > 3.7 3.3*   < > = values in this interval not displayed.   No results for input(s): "IRON", "TIBC", "FERRITIN" in the last 168 hours. Inpatient medications:  anastrozole  1 mg Oral Daily   apixaban  5 mg Oral BID   arformoterol  15 mcg Nebulization BID   aspirin  81 mg Oral Daily   budesonide (PULMICORT) nebulizer solution  0.25 mg Nebulization BID   dextromethorphan-guaiFENesin  1 tablet Oral BID   hydrOXYzine  10 mg Oral TID   influenza vaccine adjuvanted  0.5 mL Intramuscular Tomorrow-1000   midodrine  5 mg Oral TID WC   pantoprazole  40 mg Oral BID   sodium chloride flush  3 mL Intravenous Q12H    sodium chloride     acetaminophen **OR** acetaminophen, albuterol, ALPRAZolam, loperamide, mouth rinse, polyethylene glycol, zolpidem

## 2022-12-15 NOTE — Progress Notes (Signed)
PROGRESS NOTE    Stacey [name]  ZOX:[id number] DOB: 12/01/1940 DOA: 12/04/2022 PCP: [name], MD   Brief Narrative:  82 y.o. female with medical history significant of metastaic breast ca for which patient is on  ibrance. Patient was discharged from the [facility] health system on October 18, approximately 2 weeks ago after treatment for sepsis secondary to pneumonia that was present on admission affecting the right lower lobe. Overall she received 7 days of antibiotics.  She initially felt better but then she began to have shortness of breath generalized weakness. 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    Today, saw pt on bedside commode, trying to have a BM. Denies any new complaints.     Assessment & Plan:   Principal Problem:   Pneumonia Active Problems:   Anemia   HTN (hypertension)   Recurrent cancer of left breast (HCC)   Metabolic acidosis   Hypocalcemia   Fatigue   Immunosuppression due to chronic steroid use (HCC)   DVT (deep venous thrombosis) (HCC)   Diarrhea   Acute respiratory failure with hypoxia (HCC)   Acute hypoxic respiratory failure secondary to ?HCAP Mucous plugging/recurrent aspiration Currently on 2L of O2 Completed a course of IV antibiotics 5 days duration on 12/08/2022. Repeat chest x-ray on 12/08/2022 with development of small bilateral pleural effusions and bibasilar airspace disease, most likely atelectasis. Infection or aspiration could look similar. CTA chest no pulmonary embolism.  Mild bilateral bronchial wall thickening consistent with bronchitis.  New clustered solid nodule seen in the lingula and unchanged clustered nodules and mucous plugging of the right lower lobe likely due to infection or aspiration.  Respiratory virus panel negative.  COVID negative. Flu was neg -2 weeks ago Continue IS/Flutter valve/nebs/hypertonic  saline PCCM consulted, appreciate recs PT/OT  Chronic distolic HF BNP 164 ECHO showed EF of 60-65%, grade 1 DD PCCM gave lasix prn, creatinine elevated  AKI on CKD stage IIIb Noted acute urinary retention-appears to have resolved Received Lasix Renal ultrasound unremarkable CT renal stone unremarkable Nephrology consulted, likely from [facility], rec gentle IVF   Daily BMP  Orthostatic hypotension Resolving Increased midodrine Started gentle IVF by nephrology  History of DVT S/p Xarelto--> switched to PO eliquis due to worsening renal fxn (please d/c on eliquis)  Chronic diarrhea  Secondary to Ibrance in addition to recent antibiotic use and multiple bowel movements Stool C. 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  Severe anxiety  Does not take anything at home, was started on Xanax as needed with Atarax.      Estimated body mass index is 25.38 kg/m as calculated from the following:   Height as of this encounter: 5\' 3"  (1.6 m).   Weight as of this encounter: 65 kg.  DVT prophylaxis: Eliquis code Status: Full code Family Communication: None at bedside Disposition Plan:  Status is: Inpatient Remains inpatient appropriate because: Acute hypoxia   Consultants:  PCCM  Nephrology  Procedures: None  Antimicrobials: Completed   Objective: Vitals:   12/14/22 2011 12/14/22 2029 12/15/22 0737 12/15/22 1222  BP:  (!) 106/53  (!) 106/52  Pulse:  93  89  Resp:  20  19   Temp:  98.6 F (37 C)  98.2 F (36.8 C)  TempSrc:  Oral  Oral  SpO2: 97% 100% 93% 97%  Weight:      Height:        Intake/Output Summary (Last 24 hours) at 12/15/2022 1815 Last data filed at 12/15/2022 1700 Gross per 24 hour  Intake 1268.75 ml  Output 700 ml  Net 568.75 ml   Filed Weights   12/05/22 1101 12/05/22 1245 12/06/22 0524  Weight: 61.2 kg 64.2 kg 65 kg    Examination: General: NAD  Cardiovascular: S1, S2 present Respiratory: Diminished BS b/l Abdomen: Soft, nontender, nondistended, bowel sounds present Musculoskeletal: No BLE edema Skin: Normal Psychiatry: Normal mood    Data Reviewed: I have personally reviewed following labs and imaging studies  CBC: Recent Labs  Lab 12/11/22 0418 12/11/22 1259 12/12/22 0447 12/13/22 0415 12/14/22 0424 12/15/22 0412  WBC 2.6*  --  2.5* 2.5* 2.7* 2.6*  NEUTROABS 1.7  --  1.5* 1.8 1.3* 1.3*  HGB 7.0* 7.2* 7.2* 7.4* 7.7* 7.3*  HCT 22.2* 22.8* 22.6* 23.0* 23.7* 23.3*  MCV 106.2*  --  107.6* 107.5* 105.8* 108.4*  PLT 119*  --  104* 109* 132* 154   Basic Metabolic Panel: Recent Labs  Lab 12/09/22 0408 12/10/22 0414 12/10/22 0419 12/11/22 0418 12/12/22 0447 12/13/22 0415 12/14/22 0424 12/15/22 0412  NA 137  --    < > 138 137 138 135 137  K 4.4  --    < > 3.9 4.0 3.9 3.7 3.3*  CL 102  --    < > 101 100 101 97* 102  CO2 28  --    < > 28 27 27 26 24   GLUCOSE 103*  --    < > 104* 92 105* 112* 112*  BUN 17  --    < > 23 22 22 22 21   CREATININE 1.74*  --    < > 1.99* 2.15* 2.12* 2.41* 1.86*  CALCIUM 7.1*  --    < > 8.1* 8.3* 8.8* 8.2* 7.7*  MG 1.5* 1.9  --   --   --   --   --  1.0*  PHOS  --   --   --   --   --   --   --  3.4   < > = values in this interval not displayed.   GFR: Estimated Creatinine Clearance: 21.1 mL/min (A) (by C-G formula based on SCr of 1.86 mg/dL (H)). Liver Function Tests: Recent Labs  Lab 12/09/22 0408 12/15/22 0412  AST 17  --   ALT 11  --   ALKPHOS 40  --   BILITOT 0.5  --    PROT 5.3*  --   ALBUMIN 2.3* 2.6*   No results for input(s): "LIPASE", "AMYLASE" in the last 168 hours. No results for input(s): "AMMONIA" in the last 168 hours. Coagulation Profile: No results for input(s): "INR", "PROTIME" in the last 168 hours.  Cardiac Enzymes: No results for input(s): "CKTOTAL", "CKMB", "CKMBINDEX", "TROPONINI" in the last 168 hours. BNP (last 3 results) No results for input(s): "PROBNP" in the last 8760 hours. HbA1C: No results for input(s): "HGBA1C" in the last 72 hours. CBG: No results for input(s): "GLUCAP" in the  last 168 hours. Lipid Profile: No results for input(s): "CHOL", "HDL", "LDLCALC", "TRIG", "CHOLHDL", "LDLDIRECT" in the last 72 hours. Thyroid Function Tests: No results for input(s): "TSH", "T4TOTAL", "FREET4", "T3FREE", "THYROIDAB" in the last 72 hours.  Anemia Panel: No results for input(s): "VITAMINB12", "FOLATE", "FERRITIN", "TIBC", "IRON", "RETICCTPCT" in the last 72 hours.  Sepsis Labs: No results for input(s): "PROCALCITON", "LATICACIDVEN" in the last 168 hours.   Recent Results (from the past 240 hour(s))  C Difficile Quick Screen w PCR reflex     Status: None   Collection Time: 12/05/22 10:17 PM  Result Value Ref Range Status   C Diff antigen NEGATIVE NEGATIVE Final   C Diff toxin NEGATIVE NEGATIVE Final   C Diff interpretation No C. difficile detected.  Final    Comment: Performed at [facility], [address] [place], [place] [address]     Radiology Studies: No results found.   Scheduled Meds:  anastrozole  1 mg Oral Daily   apixaban  5 mg Oral BID   arformoterol  15 mcg Nebulization BID   aspirin  81 mg Oral Daily   budesonide (PULMICORT) nebulizer solution  0.25 mg Nebulization BID   dextromethorphan-guaiFENesin  1 tablet Oral BID   hydrOXYzine  10 mg Oral TID   influenza vaccine adjuvanted  0.5 mL Intramuscular Tomorrow-1000   midodrine  5 mg Oral TID WC   pantoprazole  40 mg Oral BID    sodium chloride flush  3 mL Intravenous Q12H   Continuous Infusions:  sodium chloride 75 mL/hr at 12/15/22 1337      LOS: 11 days      Briant Cedar, MD  12/15/2022, 6:15 PM

## 2022-12-15 NOTE — Progress Notes (Signed)
Occupational Therapy Treatment Patient Details Name: Stacey [name] MRN: [id number] DOB: [birth year] Today's Date: 12/15/2022   History of present illness Pt is an 82 yo female with recent admission on 11/17/22 for sepsis, and pt discharged home.  Pt admitted again on 12/04/22 for acute hypoxic respiratory failure secondary to aspiration pneumonia.  Pt with hx including but not limited to breast CA with bone mets on chronic immunotherapy, L UE lymphedema, OA, DVT   OT comments  Patient was able to engage in standing at sink for session with increased time. Patient was educated on safety alert button and smart watches to enable patient to be able to contact family and/or emergency services in next level of care. Patient will benefit from continued inpatient follow up therapy, <3 hours/day. Patient's discharge plan remains appropriate at this time. OT will continue to follow acutely.         If plan is discharge home, recommend the following:  A little help with walking and/or transfers;Assistance with cooking/housework;Help with stairs or ramp for entrance;Assist for transportation;A little help with bathing/dressing/bathroom   Equipment Recommendations  None recommended by OT       Precautions / Restrictions Precautions Precautions: Fall Precaution Comments: monitor sats, chronic diarrhea, urinary urgency, orthostatic hypotension Restrictions Weight Bearing Restrictions: No       Mobility Bed Mobility               General bed mobility comments: patient was up on BSC at start of session and sitting in recliner at end of session.        Balance Overall balance assessment: Needs assistance Sitting-balance support: No upper extremity supported, Feet supported Sitting balance-Leahy Scale: Good     Standing balance support: Single extremity supported, Reliant on assistive device for balance Standing balance-Leahy Scale: Fair Standing balance comment: standing at sink for  brushing teeth       ADL either performed or assessed with clinical judgement   ADL Overall ADL's : Needs assistance/impaired     Grooming: Oral care;Standing Grooming Details (indicate cue type and reason): standing at sink on O2 with patient reporting dizziness. Patient completed task and got into recliner that was pulled closer to her. patient unaware that BP cuff was on arm with delay in taking BP to collect new cuff and machiene. patient's BP was 118/60 seated in recliner with patient reporting no more dizziness.   Upper Body Bathing Details (indicate cue type and reason): patient declined reporting she did this already this AM.             Toilet Transfer: Contact guard assist Toilet Transfer Details (indicate cue type and reason): with no AD to get to sink and then recliner with increased time.           General ADL Comments: patient was educated on smart watches and safety alert buttons. patient reported she would look into the smart watch.      Cognition Arousal: Alert Behavior During Therapy: WFL for tasks assessed/performed Overall Cognitive Status: Within Functional Limits for tasks assessed       General Comments: patient was noted to repeat some questions during session with minimal carryover of education.                   Pertinent Vitals/ Pain       Pain Assessment Pain Assessment: No/denies pain         Frequency  Min 1X/week        Progress Toward  Goals  OT Goals(current goals can now be found in the care plan section)  Progress towards OT goals: Progressing toward goals     Plan         AM-PAC OT "6 Clicks" Daily Activity     Outcome Measure   Help from another person eating meals?: None Help from another person taking care of personal grooming?: A Little Help from another person toileting, which includes using toliet, bedpan, or urinal?: A Lot Help from another person bathing (including washing, rinsing, drying)?: A  Lot Help from another person to put on and taking off regular upper body clothing?: A Little Help from another person to put on and taking off regular lower body clothing?: A Lot 6 Click Score: 16    End of Session Equipment Utilized During Treatment: Oxygen  OT Visit Diagnosis: Muscle weakness (generalized) (M62.81);Unsteadiness on feet (R26.81)   Activity Tolerance Patient tolerated treatment well   Patient Left in chair;with call bell/phone within reach (with PT)   Nurse Communication          Time: 2130-8657 OT Time Calculation (min): 37 min  Charges: OT General Charges $OT Visit: 1 Visit OT Treatments $Self Care/Home Management : 23-37 mins  Rosalio Loud, MS Acute Rehabilitation Department Office# [phone]   Selinda Flavin 12/15/2022, 3:40 PM

## 2022-12-15 NOTE — Progress Notes (Signed)
PT Cancellation Note  Patient Details Name: Stacey [name] MRN: [id number] DOB: 11-Sep-1940   Cancelled Treatment:    Reason Eval/Treat Not Completed: Fatigue/lethargy limiting ability to participate. Pt reported having a large BM following several days of constipation and is to fatigued to participate in PT this am. PT to return if schedule allows and continue to follow acutely.    Johnny Bridge, PT Acute Rehab   Jacqualyn Posey 12/15/2022, 11:05 AM

## 2022-12-15 NOTE — Progress Notes (Signed)
Physical Therapy Treatment Patient Details Name: Stacey [name] MRN: [id number] DOB: Nov 17, 1940 Today's Date: 12/15/2022   History of Present Illness Pt is an 82 yo female with recent admission on 11/17/22 for sepsis, and pt discharged home.  Pt admitted again on 12/04/22 for acute hypoxic respiratory failure secondary to aspiration pneumonia.  Pt with hx including but not limited to breast CA with bone mets on chronic immunotherapy, L UE lymphedema, OA, DVT    PT Comments   Pt admitted with above diagnosis.  Pt currently with functional limitations due to the deficits listed below (see PT Problem List). PT returned later in the day and pt seated in recliner. Pt agreeable to therapy intervention. Pts Bp assessed, please see below. PT session conducted with pt on RA, pt O2 saturation and response monitored throughout session. Pt required CGA and cues for sit to stand  from recliner to RW. Pt able to progress with short amb bouts of 3 and 10 feet today with RW, CGA, recliner close and cues. Pt limited due to fatigue and dizziness. Pt required encouragement to remain up in recliner, pt left with all needs in place and on RA. Pt will benefit from acute skilled PT to increase their independence and safety with mobility to allow discharge.   Bp seated in recliner at rest 104/55 (89 PR) Bp immediate standing 93/55 (101 PR) Bp s/p 3 min standing 102/56 (107 PR) Bp s/p activity once returned to recliner 98/82 (88 PR) O2 saturation on RA at rest 97% and with exertion >/= 91% no reports of SOB    If plan is discharge home, recommend the following: Assistance with cooking/housework;Help with stairs or ramp for entrance;A little help with walking and/or transfers;A little help with bathing/dressing/bathroom   Can travel by private vehicle     No  Equipment Recommendations  None recommended by PT    Recommendations for Other Services Rehab consult     Precautions / Restrictions Precautions Precautions:  Fall Precaution Comments: monitor sats, chronic diarrhea, urinary urgency, orthostatic hypotension Restrictions Weight Bearing Restrictions: No     Mobility  Bed Mobility               General bed mobility comments: pt seated in recliner when PT arrived    Transfers Overall transfer level: Needs assistance Equipment used: Rolling walker (2 wheels) Transfers: Sit to/from Stand Sit to Stand: Contact guard assist           General transfer comment: pt required min cues and CGA for safety with sit to stand from recliner and reports of dizziness with immeidate standing with postural sway    Ambulation/Gait Ambulation/Gait assistance: Contact guard assist Gait Distance (Feet): 10 Feet Assistive device: Rolling walker (2 wheels) Gait Pattern/deviations: Decreased stride length, Step-to pattern, Staggering left, Staggering right Gait velocity: decreased     General Gait Details: pt reported wanting try gait tasks today 3 feet and pt reported her legs were to weak and wobbly, pt required seated theraputic rest break and then able to progress with gait for an additional 10 feet   Stairs             Wheelchair Mobility     Tilt Bed    Modified Rankin (Stroke Patients Only)       Balance Overall balance assessment: Needs assistance Sitting-balance support: No upper extremity supported, Feet supported Sitting balance-Leahy Scale: Good     Standing balance support: Single extremity supported, Reliant on assistive device for balance Standing  balance-Leahy Scale: Poor Standing balance comment: B UE support at RW and CGA                            Cognition Arousal: Alert Behavior During Therapy: WFL for tasks assessed/performed Overall Cognitive Status: Within Functional Limits for tasks assessed                                          Exercises      General Comments General comments (skin integrity, edema, etc.): RA       Pertinent Vitals/Pain Pain Assessment Faces Pain Scale: No hurt    Home Living                          Prior Function            PT Goals (current goals can now be found in the care plan section) Acute Rehab PT Goals Patient Stated Goal: return home PT Goal Formulation: With patient Time For Goal Achievement: 12/20/22 Potential to Achieve Goals: Good Progress towards PT goals: Progressing toward goals    Frequency    Min 1X/week      PT Plan      Co-evaluation              AM-PAC PT "6 Clicks" Mobility   Outcome Measure  Help needed turning from your back to your side while in a flat bed without using bedrails?: A Little Help needed moving from lying on your back to sitting on the side of a flat bed without using bedrails?: A Little Help needed moving to and from a bed to a chair (including a wheelchair)?: A Little Help needed standing up from a chair using your arms (e.g., wheelchair or bedside chair)?: A Little Help needed to walk in hospital room?: A Little Help needed climbing 3-5 steps with a railing? : A Lot 6 Click Score: 17    End of Session Equipment Utilized During Treatment: Oxygen Activity Tolerance: Patient limited by fatigue Patient left: with call bell/phone within reach;in chair Nurse Communication: Mobility status PT Visit Diagnosis: Other abnormalities of gait and mobility (R26.89);Muscle weakness (generalized) (M62.81)     Time: 1610-9604 PT Time Calculation (min) (ACUTE ONLY): 24 min  Charges:    $Gait Training: 8-22 mins $Therapeutic Activity: 8-22 mins PT General Charges $$ ACUTE PT VISIT: 1 Visit                     Johnny Bridge, PT Acute Rehab    Jacqualyn Posey 12/15/2022, 3:12 PM

## 2022-12-16 DIAGNOSIS — E785 Hyperlipidemia, unspecified: Secondary | ICD-10-CM | POA: Diagnosis present

## 2022-12-16 DIAGNOSIS — E872 Acidosis, unspecified: Secondary | ICD-10-CM

## 2022-12-16 DIAGNOSIS — Z170 Estrogen receptor positive status [ER+]: Secondary | ICD-10-CM | POA: Diagnosis not present

## 2022-12-16 DIAGNOSIS — R918 Other nonspecific abnormal finding of lung field: Secondary | ICD-10-CM | POA: Diagnosis not present

## 2022-12-16 DIAGNOSIS — I130 Hypertensive heart and chronic kidney disease with heart failure and stage 1 through stage 4 chronic kidney disease, or unspecified chronic kidney disease: Secondary | ICD-10-CM | POA: Diagnosis present

## 2022-12-16 DIAGNOSIS — R0989 Other specified symptoms and signs involving the circulatory and respiratory systems: Secondary | ICD-10-CM | POA: Diagnosis not present

## 2022-12-16 DIAGNOSIS — J90 Pleural effusion, not elsewhere classified: Secondary | ICD-10-CM | POA: Diagnosis not present

## 2022-12-16 DIAGNOSIS — Z87891 Personal history of nicotine dependence: Secondary | ICD-10-CM | POA: Diagnosis not present

## 2022-12-16 DIAGNOSIS — Z803 Family history of malignant neoplasm of breast: Secondary | ICD-10-CM | POA: Diagnosis not present

## 2022-12-16 DIAGNOSIS — T380X5A Adverse effect of glucocorticoids and synthetic analogues, initial encounter: Secondary | ICD-10-CM

## 2022-12-16 DIAGNOSIS — J439 Emphysema, unspecified: Secondary | ICD-10-CM | POA: Diagnosis present

## 2022-12-16 DIAGNOSIS — D630 Anemia in neoplastic disease: Secondary | ICD-10-CM | POA: Diagnosis present

## 2022-12-16 DIAGNOSIS — K589 Irritable bowel syndrome without diarrhea: Secondary | ICD-10-CM | POA: Diagnosis present

## 2022-12-16 DIAGNOSIS — J168 Pneumonia due to other specified infectious organisms: Secondary | ICD-10-CM | POA: Diagnosis not present

## 2022-12-16 DIAGNOSIS — I129 Hypertensive chronic kidney disease with stage 1 through stage 4 chronic kidney disease, or unspecified chronic kidney disease: Secondary | ICD-10-CM | POA: Diagnosis not present

## 2022-12-16 DIAGNOSIS — Z7952 Long term (current) use of systemic steroids: Secondary | ICD-10-CM

## 2022-12-16 DIAGNOSIS — R278 Other lack of coordination: Secondary | ICD-10-CM | POA: Diagnosis not present

## 2022-12-16 DIAGNOSIS — E876 Hypokalemia: Secondary | ICD-10-CM | POA: Diagnosis not present

## 2022-12-16 DIAGNOSIS — D84821 Immunodeficiency due to drugs: Secondary | ICD-10-CM | POA: Diagnosis not present

## 2022-12-16 DIAGNOSIS — Z9221 Personal history of antineoplastic chemotherapy: Secondary | ICD-10-CM | POA: Diagnosis not present

## 2022-12-16 DIAGNOSIS — D649 Anemia, unspecified: Secondary | ICD-10-CM | POA: Diagnosis not present

## 2022-12-16 DIAGNOSIS — D72819 Decreased white blood cell count, unspecified: Secondary | ICD-10-CM | POA: Diagnosis present

## 2022-12-16 DIAGNOSIS — J189 Pneumonia, unspecified organism: Secondary | ICD-10-CM | POA: Diagnosis not present

## 2022-12-16 DIAGNOSIS — I10 Essential (primary) hypertension: Secondary | ICD-10-CM

## 2022-12-16 DIAGNOSIS — C50911 Malignant neoplasm of unspecified site of right female breast: Secondary | ICD-10-CM | POA: Diagnosis not present

## 2022-12-16 DIAGNOSIS — Z8249 Family history of ischemic heart disease and other diseases of the circulatory system: Secondary | ICD-10-CM | POA: Diagnosis not present

## 2022-12-16 DIAGNOSIS — Z7401 Bed confinement status: Secondary | ICD-10-CM | POA: Diagnosis not present

## 2022-12-16 DIAGNOSIS — I517 Cardiomegaly: Secondary | ICD-10-CM | POA: Diagnosis not present

## 2022-12-16 DIAGNOSIS — Z90710 Acquired absence of both cervix and uterus: Secondary | ICD-10-CM | POA: Diagnosis not present

## 2022-12-16 DIAGNOSIS — C50212 Malignant neoplasm of upper-inner quadrant of left female breast: Secondary | ICD-10-CM | POA: Diagnosis present

## 2022-12-16 DIAGNOSIS — Z923 Personal history of irradiation: Secondary | ICD-10-CM | POA: Diagnosis not present

## 2022-12-16 DIAGNOSIS — C50912 Malignant neoplasm of unspecified site of left female breast: Secondary | ICD-10-CM

## 2022-12-16 DIAGNOSIS — I509 Heart failure, unspecified: Secondary | ICD-10-CM | POA: Diagnosis not present

## 2022-12-16 DIAGNOSIS — N179 Acute kidney failure, unspecified: Secondary | ICD-10-CM | POA: Diagnosis not present

## 2022-12-16 DIAGNOSIS — I5033 Acute on chronic diastolic (congestive) heart failure: Secondary | ICD-10-CM | POA: Diagnosis not present

## 2022-12-16 DIAGNOSIS — J9601 Acute respiratory failure with hypoxia: Secondary | ICD-10-CM | POA: Diagnosis not present

## 2022-12-16 DIAGNOSIS — E8721 Acute metabolic acidosis: Secondary | ICD-10-CM | POA: Diagnosis not present

## 2022-12-16 DIAGNOSIS — K219 Gastro-esophageal reflux disease without esophagitis: Secondary | ICD-10-CM | POA: Diagnosis present

## 2022-12-16 DIAGNOSIS — Z7901 Long term (current) use of anticoagulants: Secondary | ICD-10-CM | POA: Diagnosis not present

## 2022-12-16 DIAGNOSIS — R0602 Shortness of breath: Secondary | ICD-10-CM | POA: Diagnosis not present

## 2022-12-16 DIAGNOSIS — M6281 Muscle weakness (generalized): Secondary | ICD-10-CM | POA: Diagnosis not present

## 2022-12-16 DIAGNOSIS — R2689 Other abnormalities of gait and mobility: Secondary | ICD-10-CM | POA: Diagnosis not present

## 2022-12-16 DIAGNOSIS — J9621 Acute and chronic respiratory failure with hypoxia: Secondary | ICD-10-CM | POA: Diagnosis present

## 2022-12-16 DIAGNOSIS — Z86718 Personal history of other venous thrombosis and embolism: Secondary | ICD-10-CM | POA: Diagnosis not present

## 2022-12-16 DIAGNOSIS — R0682 Tachypnea, not elsewhere classified: Secondary | ICD-10-CM | POA: Diagnosis not present

## 2022-12-16 DIAGNOSIS — N1832 Chronic kidney disease, stage 3b: Secondary | ICD-10-CM | POA: Diagnosis not present

## 2022-12-16 DIAGNOSIS — I110 Hypertensive heart disease with heart failure: Secondary | ICD-10-CM | POA: Diagnosis not present

## 2022-12-16 DIAGNOSIS — D539 Nutritional anemia, unspecified: Secondary | ICD-10-CM | POA: Diagnosis present

## 2022-12-16 DIAGNOSIS — J988 Other specified respiratory disorders: Secondary | ICD-10-CM | POA: Diagnosis not present

## 2022-12-16 LAB — CBC WITH DIFFERENTIAL/PLATELET
Abs Immature Granulocytes: 0.02 10*3/uL (ref 0.00–0.07)
Basophils Absolute: 0.1 10*3/uL (ref 0.0–0.1)
Basophils Relative: 2 %
Eosinophils Absolute: 0.1 10*3/uL (ref 0.0–0.5)
Eosinophils Relative: 2 %
HCT: 22.6 % — ABNORMAL LOW (ref 36.0–46.0)
Hemoglobin: 7 g/dL — ABNORMAL LOW (ref 12.0–15.0)
Immature Granulocytes: 1 %
Lymphocytes Relative: 21 %
Lymphs Abs: 0.6 10*3/uL — ABNORMAL LOW (ref 0.7–4.0)
MCH: 33.5 pg (ref 26.0–34.0)
MCHC: 31 g/dL (ref 30.0–36.0)
MCV: 108.1 fL — ABNORMAL HIGH (ref 80.0–100.0)
Monocytes Absolute: 0.5 10*3/uL (ref 0.1–1.0)
Monocytes Relative: 18 %
Neutro Abs: 1.6 10*3/uL — ABNORMAL LOW (ref 1.7–7.7)
Neutrophils Relative %: 56 %
Platelets: 167 10*3/uL (ref 150–400)
RBC: 2.09 MIL/uL — ABNORMAL LOW (ref 3.87–5.11)
RDW: 16.6 % — ABNORMAL HIGH (ref 11.5–15.5)
WBC: 2.9 10*3/uL — ABNORMAL LOW (ref 4.0–10.5)
nRBC: 0 % (ref 0.0–0.2)

## 2022-12-16 LAB — RENAL FUNCTION PANEL
Albumin: 2.6 g/dL — ABNORMAL LOW (ref 3.5–5.0)
Anion gap: 11 (ref 5–15)
BUN: 18 mg/dL (ref 8–23)
CO2: 21 mmol/L — ABNORMAL LOW (ref 22–32)
Calcium: 7.5 mg/dL — ABNORMAL LOW (ref 8.9–10.3)
Chloride: 106 mmol/L (ref 98–111)
Creatinine, Ser: 1.52 mg/dL — ABNORMAL HIGH (ref 0.44–1.00)
GFR, Estimated: 34 mL/min — ABNORMAL LOW (ref >60)
Glucose, Bld: 110 mg/dL — ABNORMAL HIGH (ref 70–99)
Phosphorus: 2.5 mg/dL (ref 2.5–4.6)
Potassium: 3.4 mmol/L — ABNORMAL LOW (ref 3.5–5.1)
Sodium: 138 mmol/L (ref 135–145)

## 2022-12-16 LAB — MAGNESIUM: Magnesium: 2.2 mg/dL (ref 1.7–2.4)

## 2022-12-16 MED ORDER — BUDESONIDE 0.25 MG/2ML IN SUSP: 0.2500 mg | Freq: Two times a day (BID) | RESPIRATORY_TRACT | Status: DC

## 2022-12-16 MED ORDER — DM-GUAIFENESIN ER 30-600 MG PO TB12: 1.0000 | ORAL_TABLET | Freq: Two times a day (BID) | ORAL | Status: AC

## 2022-12-16 MED ORDER — APIXABAN 5 MG PO TABS: 5.0000 mg | ORAL_TABLET | Freq: Two times a day (BID) | ORAL | Status: DC

## 2022-12-16 MED ORDER — POTASSIUM CHLORIDE CRYS ER 20 MEQ PO TBCR
40.0000 meq | EXTENDED_RELEASE_TABLET | Freq: Once | ORAL | Status: AC
  Administered 2022-12-16: 40 meq via ORAL
  Filled 2022-12-16: qty 2

## 2022-12-16 NOTE — Discharge Summary (Signed)
Physician Discharge Summary  CHRISTL ALCARAZ UEA:[id number] DOB: September 14, 1940 DOA: 12/04/2022  PCP: [name], MD  Admit date: 12/04/2022 Discharge date: 12/16/2022  Admitted From: Home  Discharge disposition: SNF   Recommendations for Outpatient Follow-Up:   Follow up with your primary care provider at the skilled nursing facility in 3 to 5 days. Check CBC, BMP, magnesium in the next visit Please follow-up with Dr. Al Pimple oncology before starting [facility] on the patient. Continue oxygen and wean as tolerated, incentive spirometry.   Discharge Diagnosis:   Principal Problem:   Pneumonia Active Problems:   Anemia   HTN (hypertension)   Recurrent cancer of left breast (HCC)   Metabolic acidosis   Hypocalcemia   Fatigue   Immunosuppression due to chronic steroid use (HCC)   DVT (deep venous thrombosis) (HCC)   Diarrhea   Acute respiratory failure with hypoxia (HCC)   Discharge Condition: Improved.  Diet recommendation:   Regular.  Wound care: None.  Code status: Full.   History of Present Illness:   82 y.o. female with medical history significant of metastaic breast cancer for which patient is on  ibrance, who was discharged from the [facility] health system on October 18, approximately 2 weeks ago after treatment for sepsis secondary to pneumonia that was present on admission affecting the right lower lobe. Overall she received 7 days of antibiotics.  She initially felt better but then she began to have shortness of breath and generalized weakness.  She was noted to be hypoxic on presentation.  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  Patient was then considered for admission to the hospital further evaluation and treatment.   Hospital Course:   Following conditions were addressed during hospitalization as listed below,  Acute hypoxic respiratory failure  secondary to HCAP Mucous plugging/recurrent aspiration Currently on 2L of O2.  Would encourage incentive spirometry flutter valve and wean as tolerated. Completed a course of IV antibiotics 5 days duration on 12/08/2022. Repeat chest x-ray on 12/08/2022 with development of small bilateral pleural effusions and bibasilar airspace disease, most likely atelectasis. Infection or aspiration could look similar. CTA chest no pulmonary embolism.  Mild bilateral bronchial wall thickening consistent with bronchitis.  New clustered solid nodule seen in the lingula and unchanged clustered nodules and mucous plugging of the right lower lobe likely due to infection or aspiration. Respiratory virus panel negative.  COVID negative. Flu was neg -2 weeks ago  Chronic distolic HF BNP 164. 2 D ECHO showed EF of 60-65%, grade 1 DD.  Plans for diuretic at discharge.   AKI on CKD stage IIIb Acute urinary retention-resolved Received Lasix.Renal ultrasound/CT renal stone unremarkable. Nephrology consulted due to AKI, and received gentle IV fluids.  Communicated with with nephrology and okay for disposition today.  Creatinine has improved at this time at 1.5 close to baseline..    Orthostatic hypotension Resolved.  Asymptomatic.  Received midodrine.  Received IV fluids.  Will discontinue midodrine on discharge.   History of DVT S/p Xarelto--> switched to PO eliquis due to worsening renal fxn    Chronic diarrhea  Secondary to Ibrance in addition to recent antibiotic use and multiple bowel movements Stool C. Difficile negative.  Ilda Foil will be on hold on discharge.  Hypomagnesemia/hypocalcemia Hypokalemia. Replenished potassium today.  Magnesium prior to discharge was 2.2.  Recommend checking electrolytes in 3 to 5 days.   History  of hypertension Not on medications.  Required short course of midodrine while in the hospital.   Acute on chronic anemia of chronic disease/ malignancy Pancytopenia Hemoglobin dropped  to 6.5 from 7.6 with no evidence of bleeding, S/p 1 unit of prbc Hemoglobin of 7.0 today.  Would recommend CBC follow-up as outpatient.  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 Al Pimple about it.  Patient knows to communicate with Dr. Al Pimple prior to resuming it.   Severe anxiety  Improved.  Generalized deconditioning, debility, weakness.  Patient was seen by PT OT during hospitalization and recommendation is for skilled nursing facility on discharge.  Disposition.  At this time, patient is stable for disposition to skilled nursing facility.  Medical Consultants:   PCCM Nephrology  Procedures:    None Subjective:   Today, patient feels okay.  Denies any shortness of breath cough, chest pain, fever, chills or rigor.  Has generalized weakness but does not feel dizzy lightheaded.  Discharge Exam:   Vitals:   12/16/22 0543 12/16/22 0818  BP: (!) 104/44   Pulse: 82   Resp: 14   Temp: 98.6 F (37 C)   SpO2: 96% 95%   Vitals:   12/15/22 2019 12/15/22 2113 12/16/22 0543 12/16/22 0818  BP:  (!) 104/47 (!) 104/44   Pulse:  78 82   Resp:   14   Temp:  98.6 F (37 C) 98.6 F (37 C)   TempSrc:  Oral Oral   SpO2: 97% 95% 96% 95%  Weight:      Height:       Body mass index is 25.38 kg/m.   General: Alert awake, not in obvious distress, on nasal cannula oxygen HENT: pupils equally reacting to light,  No scleral pallor or icterus noted. Oral mucosa is moist.  Chest:    Diminished breath sounds bilaterally.  CVS: S1 &S2 heard. No murmur.  Regular rate and rhythm. Abdomen: Soft, nontender, nondistended.  Bowel sounds are heard.   Extremities: No cyanosis, clubbing or edema.  Peripheral pulses are palpable. Psych: Alert, awake and oriented, normal mood CNS:  No cranial nerve deficits.  Moves all extremities. Skin: Warm and dry.  No rashes noted.  The results of significant  diagnostics from this hospitalization (including imaging, microbiology, ancillary and laboratory) are listed below for reference.     Diagnostic Studies:   ECHOCARDIOGRAM COMPLETE  Result Date: 12/05/2022    ECHOCARDIOGRAM REPORT   Patient Name:   RASA MATKOWSKI Date of Exam: 12/05/2022 Medical Rec #:  [id number]    Height:       63.0 in Accession #:    [id number]   Weight:       138.0 lb Date of Birth:  05-Jul-1940    BSA:          1.652 m Patient Age:    82 years     BP:           108/56 mmHg Patient Gender: F            HR:           90 bpm. Exam Location:  Inpatient Procedure: 2D Echo, Cardiac Doppler and Color Doppler Indications:    CHF Acute Systolic I50.21  History:        Patient has prior history of Echocardiogram examinations, most                 recent  05/25/2017. COPD; Risk Factors:Dyslipidemia.  Sonographer:    Harriette Bouillon RDCS Referring Phys: [id number]  [facility] GOEL IMPRESSIONS  1. Left ventricular ejection fraction, by estimation, is 60 to 65%. The left ventricle has normal function. The left ventricle has no regional wall motion abnormalities. There is mild concentric left ventricular hypertrophy. Left ventricular diastolic parameters are consistent with Grade I diastolic dysfunction (impaired relaxation). Elevated left atrial pressure.  2. Right ventricular systolic function is normal. The right ventricular size is normal.  3. The mitral valve is normal in structure. No evidence of mitral valve regurgitation. No evidence of mitral stenosis.  4. The aortic valve is tricuspid. Aortic valve regurgitation is not visualized. No aortic stenosis is present. FINDINGS  Left Ventricle: Left ventricular ejection fraction, by estimation, is 60 to 65%. The left ventricle has normal function. The left ventricle has no regional wall motion abnormalities. The left ventricular internal cavity size was normal in size. There is  mild concentric left ventricular hypertrophy. Left ventricular diastolic parameters are  consistent with Grade I diastolic dysfunction (impaired relaxation). Elevated left atrial pressure. Right Ventricle: The right ventricular size is normal. Right ventricular systolic function is normal. Left Atrium: Left atrial size was normal in size. Right Atrium: Right atrial size was normal in size. Pericardium: There is no evidence of pericardial effusion. Mitral Valve: The mitral valve is normal in structure. Mild mitral annular calcification. No evidence of mitral valve regurgitation. No evidence of mitral valve stenosis. Tricuspid Valve: The tricuspid valve is normal in structure. Tricuspid valve regurgitation is mild . No evidence of tricuspid stenosis. Aortic Valve: The aortic valve is tricuspid. Aortic valve regurgitation is not visualized. No aortic stenosis is present. Pulmonic Valve: The pulmonic valve was normal in structure. Pulmonic valve regurgitation is trivial. No evidence of pulmonic stenosis. Aorta: The aortic root is normal in size and structure. Venous: The inferior vena cava was not well visualized. IAS/Shunts: The interatrial septum was not well visualized.  LEFT VENTRICLE PLAX 2D LVIDd:         3.30 cm   Diastology LVIDs:         2.10 cm   LV e' medial:    5.44 cm/s LV PW:         1.20 cm   LV E/e' medial:  17.5 LV IVS:        1.20 cm   LV e' lateral:   7.18 cm/s LVOT diam:     2.00 cm   LV E/e' lateral: 13.3 LV SV:         63 LV SV Index:   38 LVOT Area:     3.14 cm  RIGHT VENTRICLE TAPSE (M-mode): 1.5 cm LEFT ATRIUM             Index LA diam:        2.10 cm 1.27 cm/m LA Vol (A2C):   39.2 ml 23.74 ml/m LA Vol (A4C):   49.1 ml 29.73 ml/m LA Biplane Vol: 46.1 ml 27.91 ml/m  AORTIC VALVE LVOT Vmax:   99.20 cm/s LVOT Vmean:  73.400 cm/s LVOT VTI:    0.202 m  AORTA Ao Root diam: 3.00 cm Ao Asc diam:  3.30 cm MITRAL VALVE               TRICUSPID VALVE MV Area (PHT): 3.99 cm    TR Peak grad:   37.5 mmHg MV Decel Time: 190 msec    TR Vmax:        306.00 cm/s MV  E velocity: 95.20 cm/s MV A  velocity: 99.50 cm/s  SHUNTS MV E/A ratio:  0.96        Systemic VTI:  0.20 m                            Systemic Diam: 2.00 cm Olga Millers MD Electronically signed by Olga Millers MD Signature Date/Time: 12/05/2022/2:27:54 PM    Final    CT Angio Chest PE W and/or Wo Contrast  Result Date: 12/04/2022 CLINICAL DATA:  Shortness of breath EXAM: CT ANGIOGRAPHY CHEST WITH CONTRAST TECHNIQUE: Multidetector CT imaging of the chest was performed using the standard protocol during bolus administration of intravenous contrast. Multiplanar CT image reconstructions and MIPs were obtained to evaluate the vascular anatomy. RADIATION DOSE REDUCTION: This exam was performed according to the departmental dose-optimization program which includes automated exposure control, adjustment of the mA and/or kV according to patient size and/or use of iterative reconstruction technique. CONTRAST:  75mL OMNIPAQUE IOHEXOL 350 MG/ML SOLN COMPARISON:  CTA chest dated November 17, 2022 FINDINGS: Cardiovascular: No evidence of pulmonary embolus. Normal heart size. No pericardial effusion. Normal caliber thoracic aorta with severe atherosclerotic disease. Moderate coronary artery calcifications. Mediastinum/Nodes: Moderate hiatal hernia. Thyroid is unremarkable. No enlarged lymph nodes seen in the chest. Lungs/Pleura: Central airways are patent. Moderate emphysema. Subpleural reticulations of the anterior right lung, consistent with postradiation change. Mild bilateral bronchial wall thickening. Unchanged clustered nodules and mucous plugging of the right lower lobe. New clustered solid nodule seen in the lingula. Reference new nodule measuring 4 mm on series 12, image 64. No pleural effusion or pneumothorax. Upper Abdomen: No acute abnormality. Musculoskeletal: Postsurgical changes of the right breast. No aggressive appearing osseous lesions. Review of the MIP images confirms the above findings. IMPRESSION: 1. No evidence of pulmonary  embolus. 2. Mild bilateral bronchial wall thickening, consistent with bronchitis. 3. New clustered solid nodules seen in the lingula and unchanged clustered nodules and mucous plugging of the right lower lobe, likely due to infection or aspiration. Recommend follow-up in 3 months to ensure resolution. 4. Moderate hiatal hernia. 5. Aortic Atherosclerosis (ICD10-I70.0) and Emphysema (ICD10-J43.9). Electronically Signed   By: Allegra Lai M.D.   On: 12/04/2022 19:40   DG Chest Port 1 View  Result Date: 12/04/2022 CLINICAL DATA:  Shortness of breath EXAM: PORTABLE CHEST 1 VIEW COMPARISON:  11/17/2022 FINDINGS: Heart and mediastinal contours within normal limits. No confluent airspace opacities or effusions. No acute bony abnormality. IMPRESSION: No active disease. Electronically Signed   By: Charlett Nose M.D.   On: 12/04/2022 18:26     Labs:   Basic Metabolic Panel: Recent Labs  Lab 12/10/22 0414 12/10/22 0419 12/12/22 0447 12/13/22 0415 12/14/22 0424 12/15/22 0412 12/16/22 0408  NA  --    < > 137 138 135 137 138  K  --    < > 4.0 3.9 3.7 3.3* 3.4*  CL  --    < > 100 101 97* 102 106  CO2  --    < > 27 27 26 24  21*  GLUCOSE  --    < > 92 105* 112* 112* 110*  BUN  --    < > 22 22 22 21 18   CREATININE  --    < > 2.15* 2.12* 2.41* 1.86* 1.52*  CALCIUM  --    < > 8.3* 8.8* 8.2* 7.7* 7.5*  MG 1.9  --   --   --   --  1.0* 2.2  PHOS  --   --   --   --   --  3.4 2.5   < > = values in this interval not displayed.   GFR Estimated Creatinine Clearance: 25.9 mL/min (A) (by C-G formula based on SCr of 1.52 mg/dL (H)). Liver Function Tests: Recent Labs  Lab 12/15/22 0412 12/16/22 0408  ALBUMIN 2.6* 2.6*   No results for input(s): "LIPASE", "AMYLASE" in the last 168 hours. No results for input(s): "AMMONIA" in the last 168 hours. Coagulation profile No results for input(s): "INR", "PROTIME" in the last 168 hours.  CBC: Recent Labs  Lab 12/12/22 0447 12/13/22 0415 12/14/22 0424  12/15/22 0412 12/16/22 0408  WBC 2.5* 2.5* 2.7* 2.6* 2.9*  NEUTROABS 1.5* 1.8 1.3* 1.3* 1.6*  HGB 7.2* 7.4* 7.7* 7.3* 7.0*  HCT 22.6* 23.0* 23.7* 23.3* 22.6*  MCV 107.6* 107.5* 105.8* 108.4* 108.1*  PLT 104* 109* 132* 154 167   Cardiac Enzymes: No results for input(s): "CKTOTAL", "CKMB", "CKMBINDEX", "TROPONINI" in the last 168 hours. BNP: Invalid input(s): "POCBNP" CBG: No results for input(s): "GLUCAP" in the last 168 hours. D-Dimer No results for input(s): "DDIMER" in the last 72 hours. Hgb A1c No results for input(s): "HGBA1C" in the last 72 hours. Lipid Profile No results for input(s): "CHOL", "HDL", "LDLCALC", "TRIG", "CHOLHDL", "LDLDIRECT" in the last 72 hours. Thyroid function studies No results for input(s): "TSH", "T4TOTAL", "T3FREE", "THYROIDAB" in the last 72 hours.  Invalid input(s): "FREET3" Anemia work up No results for input(s): "VITAMINB12", "FOLATE", "FERRITIN", "TIBC", "IRON", "RETICCTPCT" in the last 72 hours. Microbiology No results found for this or any previous visit (from the past 240 hour(s)).   Discharge Instructions:   Discharge Instructions     Diet general   Complete by: As directed    Discharge instructions   Complete by: As directed    Follow-up with your primary care provider at the skilled nursing facility in 3 to 5 days.  Check blood work at that time.  Follow-up with Dr. Al Pimple, oncology and discuss about resuming Ibrance when ok with oncology.   Seek medical attention for worsening symptoms.   Increase activity slowly   Complete by: As directed       Allergies as of 12/16/2022       Reactions   Other Nausea Only, Other (See Comments)   A lot of antibiotics cause extreme nausea   Tape Other (See Comments)   PAPER TAPE IS MUCH PREFERRED   Aminoglycosides Other (See Comments)   Unknown reaction - pt is not sure where this entry came from   Bacitracin Other (See Comments)   Reaction not recalled   Bee Venom Swelling, Other (See  Comments)   Severe body swelling   Cephalexin Nausea Only   Clindamycin/lincomycin Diarrhea, Nausea And Vomiting   Codeine Nausea And Vomiting, Other (See Comments)   Pt can take codeine cough syrup        Medication List     STOP taking these medications    dexamethasone 4 MG tablet Commonly known as: DECADRON   Magnesium 500 MG Caps   MAGNESIUM PO   rivaroxaban 20 MG Tabs tablet Commonly known as: XARELTO   telmisartan 40 MG tablet Commonly known as: MICARDIS       TAKE these medications    acetaminophen 500 MG tablet Commonly known as: TYLENOL Take 1,000 mg by mouth every 8 (eight) hours as needed for mild pain (pain score 1-3), headache or fever.   anastrozole  1 MG tablet Commonly known as: ARIMIDEX TAKE 1 TABLET(1 MG) BY MOUTH DAILY What changed: See the new instructions.   apixaban 5 MG Tabs tablet Commonly known as: ELIQUIS Take 1 tablet (5 mg total) by mouth 2 (two) times daily.   aspirin EC 81 MG tablet Take 81 mg by mouth every morning.   budesonide 0.25 MG/2ML nebulizer solution Commonly known as: PULMICORT Take 2 mLs (0.25 mg total) by nebulization 2 (two) times daily for 5 days.   CALCIUM CARBONATE-VITAMIN D PO Take 1 tablet by mouth 2 (two) times daily.   dextromethorphan-guaiFENesin 30-600 MG 12hr tablet Commonly known as: MUCINEX DM Take 1 tablet by mouth 2 (two) times daily for 5 days.   diphenhydrAMINE 25 MG tablet Commonly known as: BENADRYL Take 25 mg by mouth every 6 (six) hours as needed (for bee stings).   fulvestrant 250 MG/5ML injection Commonly known as: FASLODEX Inject 250 mg into the muscle every 28 (twenty-eight) days.   Ibrance 100 MG tablet Generic drug: palbociclib Take 1 tablet (100 mg total) by mouth Monday through Friday. Take as directed by MD. What changed:  how much to take how to take this when to take this additional instructions   loperamide 2 MG capsule Commonly known as: IMODIUM Take 2-4 mg by  mouth See admin instructions. Take 4 mg by mouth after the first loose stool, then 2 mg for each subsequent one. Cannot exceed a sum total of 16 mg/24 hours. Stop if there are no loose stools after 12 hours.   loratadine 10 MG tablet Commonly known as: CLARITIN Take 10 mg by mouth every morning.   multivitamin with minerals Tabs tablet Take 1 tablet by mouth every morning. Centrum   omeprazole 20 MG capsule Commonly known as: PRILOSEC Take 1 capsule by mouth daily What changed:  how much to take how to take this when to take this   prochlorperazine 10 MG tablet Commonly known as: COMPAZINE Take 1 tablet (10 mg total) by mouth every 6 (six) hours as needed for nausea or vomiting.   triamcinolone 55 MCG/ACT Aero nasal inhaler Commonly known as: NASACORT Place 2 sprays into the nose daily. 2 sprays each nostril at night before bedtime What changed:  when to take this additional instructions   vitamin B-12 100 MCG tablet Commonly known as: CYANOCOBALAMIN Take 100 mcg by mouth daily.   VITAMIN D3 PO Take 1 capsule by mouth every morning.   zolpidem 10 MG tablet Commonly known as: AMBIEN Take 1 tablet (10 mg total) by mouth at bedtime as needed for sleep. What changed: how much to take        Contact information for follow-up providers     Iruku, Burnice Logan, MD Follow up in 1 week(s).   Specialty: Hematology and Oncology Why: ibrance on hold, discuss about it, regular followup Contact information: [address] [place] [address] [phone]              Contact information for after-discharge care     Destination     [facility] .   Service: Skilled Nursing Contact information: [address] Test Update Address [place] [address] [phone]                      Time coordinating discharge: 39 minutes  Signed:  Nahuel Wilbert  [facility] 12/16/2022, 11:09 AM

## 2022-12-16 NOTE — Progress Notes (Signed)
[facility] Progress Note  Subjective: no c/o's, creat down to 1.5 Vitals:   12/15/22 2019 12/15/22 2113 12/16/22 0543 12/16/22 0818  BP:  (!) 104/47 (!) 104/44   Pulse:  78 82   Resp:   14   Temp:  98.6 F (37 C) 98.6 F (37 C)   TempSrc:  Oral Oral   SpO2: 97% 95% 96% 95%  Weight:      Height:        Exam: Gen alert, no distress, [place] O2 No jvd or bruits Chest clear bilat to bases RRR no RG Abd soft ntnd no mass or ascites +bs Ext no LE or UE edema Neuro is alert, Ox 3 , nf       Renal-related home meds: - prilosec - telmisartan    Date                            Creat               eGFR  2008- 2020                 0.60- 0.95  2021                           0.93- 1.24  2022                           1.16- 1.47  2023                           1.12- 1.52  Jan- jul 2024              1.11- 1.53          Aug- oct 2024             1.09- 1.54        34- 51 ml/min, 3b  11/001- 12/14/22        1.13- 2.41       Pt rec'd IV contrast 75 cc w/ CTA chest on 11/01.  BP's on admission were wnl. Since 11/08 has had some soft BP's off and on, lowest in the 90s O2 requirement = 2 L [place] on admission, peaked 5 L [place] on 11/07, and now back on 2L [place] IP meds of interest:   IV lasix 11/08- 11/08  Midodrine 2.5 2-3 x per day last 5 days  IV unasyn 11/02 - 11/05  No acei/ ARB/ nsaids       UA 11/2 - sg 1.046, large LE, rare bact, 0-5 rbc/epi, 11-20 wbc   CT renal stone 11/10- Urinary Tract: no renal calculi or hydronephrosis. No hydroureter or ureteric calculi. No bladder calculi. No significant bladder distention.         Assessment/ Plan: AKI on CKD 3b - b/l creatinine 1.1- 1.5 from aug- oct 2024, eGFR 34- 51 ml/min. Creat here was 1.4 on admission in the setting of suspected PNA and AHRF.  Noncontrast CT 11/10 showed normal kidneys w/o obstruction, also renal US on 11/05 w/o obstruction.  UA on 11/2 was unremarkable. Pt had IV contrast exposure on 11/01 CT w/ gradual of  creatinine up to 2.4 yesterday. AKI suspected due to ATN from IV contrast +/- vol depletion related to diarrhea +/- home ARB (on hold here). Recommendation was for IVF's. She rec'd  IVF's overnight and creat improved to 1.8 yest and 1.5 today. OK for discharge today. Pt declined renal f/u in OP setting, said she would f/u with her cancer doctor and PCP for now. Have d/w pmd.  AHRF/ suspected HCAP - sp 5 day course of IV abx Stage IV metastatic breast cancer Chronic diarrhea       Stacey Moselle MD  [facility] 12/16/2022, 11:13 AM  Recent Labs  Lab 12/15/22 0412 12/16/22 0408  HGB 7.3* 7.0*  ALBUMIN 2.6* 2.6*  CALCIUM 7.7* 7.5*  PHOS 3.4 2.5  CREATININE 1.86* 1.52*  K 3.3* 3.4*   No results for input(s): "IRON", "TIBC", "FERRITIN" in the last 168 hours. Inpatient medications:  anastrozole  1 mg Oral Daily   apixaban  5 mg Oral BID   arformoterol  15 mcg Nebulization BID   aspirin  81 mg Oral Daily   budesonide (PULMICORT) nebulizer solution  0.25 mg Nebulization BID   dextromethorphan-guaiFENesin  1 tablet Oral BID   hydrOXYzine  10 mg Oral TID   influenza vaccine adjuvanted  0.5 mL Intramuscular Tomorrow-1000   midodrine  5 mg Oral TID WC   pantoprazole  40 mg Oral BID   sodium chloride flush  3 mL Intravenous Q12H    sodium chloride 75 mL/hr at 12/16/22 1016   acetaminophen **OR** acetaminophen, albuterol, ALPRAZolam, loperamide, mouth rinse, polyethylene glycol, zolpidem

## 2022-12-16 NOTE — Progress Notes (Signed)
Mobility Specialist - Progress Note   12/16/22 1101  Mobility  Activity Ambulated with assistance in hallway  Level of Assistance Standby assist, set-up cues, supervision of patient - no hands on  Assistive Device Front wheel walker  Distance Ambulated (ft) 120 ft  Range of Motion/Exercises Active  Activity Response Tolerated well  Mobility Referral Yes  $Mobility charge 1 Mobility  Mobility Specialist Start Time (ACUTE ONLY) 1004  Mobility Specialist Stop Time (ACUTE ONLY) 1017  Mobility Specialist Time Calculation (min) (ACUTE ONLY) 13 min   Received in bed and agreed to mobility. Had no c/o dizziness throughout session. Returned to bed with all needs met. Staff in room.  Marilynne Halsted Mobility Specialist

## 2022-12-16 NOTE — TOC Transition Note (Signed)
Transition of Care [facility]) - CM/SW Discharge Note   Patient Details  Name: Stacey [name] MRN: [id number] Date of Birth: Jul 20, 1940  Transition of Care [facility]) CM/SW Contact:  Larrie Kass, LCSW Phone Number: 12/16/2022, 11:34 AM   Clinical Narrative:    Pt to d/c to [facility] room 410B, RN to call report [phone]. PTAR called no further TOC needs , TOC sign off.    Final next level of care: Skilled Nursing Facility Barriers to Discharge: No Barriers Identified   Patient Goals and CMS Choice CMS Medicare.gov Compare Post Acute Care list provided to:: Patient Choice offered to / list presented to : Patient  Discharge Placement                      Patient and family notified of of transfer: 12/16/22  Discharge Plan and Services Additional resources added to the After Visit Summary for                                       Social Determinants of Health (SDOH) Interventions SDOH Screenings   Food Insecurity: No Food Insecurity (12/05/2022)  Housing: Low Risk  (12/06/2022)  Transportation Needs: No Transportation Needs (12/06/2022)  Utilities: Not At Risk (12/06/2022)  Alcohol Screen: Low Risk  (11/07/2022)  Depression (PHQ2-9): Low Risk  (11/10/2022)  Financial Resource Strain: Low Risk  (11/07/2022)  Physical Activity: Inactive (11/07/2022)  Social Connections: Socially Isolated (11/07/2022)  Stress: No Stress Concern Present (11/07/2022)  Tobacco Use: Medium Risk (12/04/2022)  Health Literacy: Adequate Health Literacy (11/10/2022)     Readmission Risk Interventions    11/19/2022    1:54 PM  Readmission Risk Prevention Plan  Transportation Screening Complete  PCP or Specialist Appt within 5-7 Days Complete  Home Care Screening Complete  Medication Review (RN CM) Complete

## 2022-12-16 NOTE — Care Management Important Message (Signed)
Important Message  Patient Details  Name: RIKKI LIDDIARD MRN: [id number] Date of Birth: [birth year]   Important Message Given:  Yes - Medicare IM (received information verbally)     Corey Harold 12/16/2022, 1:34 PM

## 2022-12-17 ENCOUNTER — Ambulatory Visit: Payer: Medicare Other

## 2022-12-17 ENCOUNTER — Telehealth: Payer: Self-pay

## 2022-12-17 ENCOUNTER — Other Ambulatory Visit: Payer: Medicare Other

## 2022-12-17 ENCOUNTER — Ambulatory Visit: Payer: Medicare Other | Admitting: Adult Health

## 2022-12-17 ENCOUNTER — Telehealth: Payer: Self-pay | Admitting: Hematology and Oncology

## 2022-12-17 ENCOUNTER — Ambulatory Visit (INDEPENDENT_AMBULATORY_CARE_PROVIDER_SITE_OTHER): Payer: Medicare Other | Admitting: Otolaryngology

## 2022-12-17 DIAGNOSIS — E8721 Acute metabolic acidosis: Secondary | ICD-10-CM | POA: Diagnosis not present

## 2022-12-17 DIAGNOSIS — D649 Anemia, unspecified: Secondary | ICD-10-CM | POA: Diagnosis not present

## 2022-12-17 DIAGNOSIS — C50911 Malignant neoplasm of unspecified site of right female breast: Secondary | ICD-10-CM | POA: Diagnosis not present

## 2022-12-17 DIAGNOSIS — I10 Essential (primary) hypertension: Secondary | ICD-10-CM | POA: Diagnosis not present

## 2022-12-17 NOTE — Transitions of Care (Post Inpatient/ED Visit) (Signed)
   12/17/2022  Name: BEXLEY BOOTHE MRN: [id number] DOB: 1940/04/13  Today's TOC FU Call Status: Today's TOC FU Call Status:: Successful TOC FU Call Completed TOC FU Call Complete Date: 12/17/22 Patient's Name and Date of Birth confirmed.  Transition Care Management Follow-up Telephone Call Date of Discharge:  (patient still in [place])  Items Reviewed:    Medications Reviewed Today: Medications Reviewed Today   Medications were not reviewed in this encounter     Home Care and Equipment/Supplies:    Functional Questionnaire:    Follow up appointments reviewed:   Patient still in rehab at [facility] Karena Addison, LPN [facility] Nurse Health Advisor Direct Dial [phone]

## 2022-12-18 ENCOUNTER — Telehealth: Payer: Self-pay | Admitting: *Deleted

## 2022-12-18 ENCOUNTER — Inpatient Hospital Stay: Payer: Medicare Other

## 2022-12-18 NOTE — Telephone Encounter (Signed)
This RN spoke with pt who stated she is fairing well at present with goal of being able to ambulate " so I can get back home!".  She states discharge date is currently not in place but she is hoping at the most for 2 weeks.  Plan per discussion is Arissa will call this RN when she is given an expected discharge date for current appt on 12/12 to be moved to sooner per need to resume Ibrance and or other therapy.

## 2022-12-21 ENCOUNTER — Other Ambulatory Visit: Payer: Self-pay | Admitting: *Deleted

## 2022-12-21 NOTE — Patient Outreach (Signed)
Post- Acute Care Manager follow up. Ms. Cowherd resides in [place] skilled nursing facility.  Ms. Flyte is active with chronic care management [facility] team.   Collaboration telephonic meeting with Vernia Buff, [facility] social worker. Deseree reports she is trying to reach family to schedule care plan meeting.   Will continue to follow and update TOC team RN as appropriate.   Raiford Noble, MSN, RN, BSN [place]  [facility], [facility] Commun[facility] RN Post- Acute Care Manager Direct Dial: [phone]

## 2022-12-23 ENCOUNTER — Telehealth: Payer: Self-pay | Admitting: Hematology and Oncology

## 2022-12-23 ENCOUNTER — Other Ambulatory Visit: Payer: Self-pay | Admitting: Adult Health

## 2022-12-23 ENCOUNTER — Encounter: Payer: Self-pay | Admitting: Oncology

## 2022-12-24 ENCOUNTER — Encounter: Payer: Self-pay | Admitting: Oncology

## 2022-12-24 ENCOUNTER — Other Ambulatory Visit: Payer: Self-pay | Admitting: *Deleted

## 2022-12-24 NOTE — Patient Outreach (Signed)
Post- Acute Care Manager follow up. Ms. Lojek resides in [place] skilled nursing facility.  Screening for potential chronic care management services as a benefit of health plan and primary care provider.  Facility site visit to [facility]. Met with Mrs. Walthers at bedside. Discussed chronic care management services. Mrs. Gulden is agreeable. Confirms best contact number is [phone]. Confirms PCP is Dr. Jonny Ruiz with [facility]. Reports plan is to return home alone. Reports they are weaning oxygen to 2 liters currently. Bedside conversation was shortened as Ms. Credit was going into a careplan meeting.   Will follow up at later time.   Facility reports they have been unsuccessful in contacting Ms. Aymond's daughter.   Will continue to follow.   Raiford Noble, MSN, RN, BSN [place]  [facility], Healthy Communities RN Post- Acute Care Manager Direct Dial: [phone]

## 2022-12-25 ENCOUNTER — Other Ambulatory Visit: Payer: Self-pay

## 2022-12-25 ENCOUNTER — Inpatient Hospital Stay (HOSPITAL_COMMUNITY)
Admission: EM | Admit: 2022-12-25 | Discharge: 2022-12-29 | DRG: 291 | Disposition: A | Discharge status: Skilled Nursing Facility | Payer: Medicare Other | Source: Skilled Nursing Facility | Attending: Family Medicine | Admitting: Family Medicine

## 2022-12-25 ENCOUNTER — Emergency Department (HOSPITAL_COMMUNITY): Payer: Medicare Other

## 2022-12-25 DIAGNOSIS — E876 Hypokalemia: Secondary | ICD-10-CM | POA: Diagnosis not present

## 2022-12-25 DIAGNOSIS — Z90710 Acquired absence of both cervix and uterus: Secondary | ICD-10-CM | POA: Diagnosis not present

## 2022-12-25 DIAGNOSIS — Z87891 Personal history of nicotine dependence: Secondary | ICD-10-CM

## 2022-12-25 DIAGNOSIS — Z803 Family history of malignant neoplasm of breast: Secondary | ICD-10-CM

## 2022-12-25 DIAGNOSIS — Z7901 Long term (current) use of anticoagulants: Secondary | ICD-10-CM | POA: Diagnosis not present

## 2022-12-25 DIAGNOSIS — Z9221 Personal history of antineoplastic chemotherapy: Secondary | ICD-10-CM | POA: Diagnosis not present

## 2022-12-25 DIAGNOSIS — I130 Hypertensive heart and chronic kidney disease with heart failure and stage 1 through stage 4 chronic kidney disease, or unspecified chronic kidney disease: Secondary | ICD-10-CM | POA: Diagnosis present

## 2022-12-25 DIAGNOSIS — I5033 Acute on chronic diastolic (congestive) heart failure: Secondary | ICD-10-CM | POA: Diagnosis present

## 2022-12-25 DIAGNOSIS — I517 Cardiomegaly: Secondary | ICD-10-CM | POA: Diagnosis not present

## 2022-12-25 DIAGNOSIS — C50212 Malignant neoplasm of upper-inner quadrant of left female breast: Secondary | ICD-10-CM | POA: Diagnosis not present

## 2022-12-25 DIAGNOSIS — Z923 Personal history of irradiation: Secondary | ICD-10-CM | POA: Diagnosis not present

## 2022-12-25 DIAGNOSIS — Z8249 Family history of ischemic heart disease and other diseases of the circulatory system: Secondary | ICD-10-CM

## 2022-12-25 DIAGNOSIS — Z7951 Long term (current) use of inhaled steroids: Secondary | ICD-10-CM

## 2022-12-25 DIAGNOSIS — D649 Anemia, unspecified: Secondary | ICD-10-CM | POA: Diagnosis present

## 2022-12-25 DIAGNOSIS — K589 Irritable bowel syndrome without diarrhea: Secondary | ICD-10-CM | POA: Diagnosis present

## 2022-12-25 DIAGNOSIS — D72819 Decreased white blood cell count, unspecified: Secondary | ICD-10-CM | POA: Diagnosis present

## 2022-12-25 DIAGNOSIS — Z1732 Human epidermal growth factor receptor 2 negative status: Secondary | ICD-10-CM

## 2022-12-25 DIAGNOSIS — Z9842 Cataract extraction status, left eye: Secondary | ICD-10-CM

## 2022-12-25 DIAGNOSIS — D630 Anemia in neoplastic disease: Secondary | ICD-10-CM | POA: Diagnosis present

## 2022-12-25 DIAGNOSIS — Z9841 Cataract extraction status, right eye: Secondary | ICD-10-CM

## 2022-12-25 DIAGNOSIS — R918 Other nonspecific abnormal finding of lung field: Secondary | ICD-10-CM | POA: Diagnosis not present

## 2022-12-25 DIAGNOSIS — Z170 Estrogen receptor positive status [ER+]: Secondary | ICD-10-CM | POA: Diagnosis not present

## 2022-12-25 DIAGNOSIS — R278 Other lack of coordination: Secondary | ICD-10-CM | POA: Diagnosis not present

## 2022-12-25 DIAGNOSIS — R2689 Other abnormalities of gait and mobility: Secondary | ICD-10-CM | POA: Diagnosis not present

## 2022-12-25 DIAGNOSIS — E872 Acidosis, unspecified: Secondary | ICD-10-CM | POA: Diagnosis present

## 2022-12-25 DIAGNOSIS — Z888 Allergy status to other drugs, medicaments and biological substances status: Secondary | ICD-10-CM

## 2022-12-25 DIAGNOSIS — E8721 Acute metabolic acidosis: Secondary | ICD-10-CM | POA: Diagnosis not present

## 2022-12-25 DIAGNOSIS — Z79899 Other long term (current) drug therapy: Secondary | ICD-10-CM

## 2022-12-25 DIAGNOSIS — J9600 Acute respiratory failure, unspecified whether with hypoxia or hypercapnia: Secondary | ICD-10-CM | POA: Diagnosis not present

## 2022-12-25 DIAGNOSIS — I110 Hypertensive heart disease with heart failure: Secondary | ICD-10-CM | POA: Diagnosis not present

## 2022-12-25 DIAGNOSIS — R339 Retention of urine, unspecified: Secondary | ICD-10-CM | POA: Diagnosis not present

## 2022-12-25 DIAGNOSIS — I509 Heart failure, unspecified: Principal | ICD-10-CM

## 2022-12-25 DIAGNOSIS — Z91030 Bee allergy status: Secondary | ICD-10-CM

## 2022-12-25 DIAGNOSIS — Z1721 Progesterone receptor positive status: Secondary | ICD-10-CM

## 2022-12-25 DIAGNOSIS — M6281 Muscle weakness (generalized): Secondary | ICD-10-CM | POA: Diagnosis not present

## 2022-12-25 DIAGNOSIS — K219 Gastro-esophageal reflux disease without esophagitis: Secondary | ICD-10-CM | POA: Diagnosis present

## 2022-12-25 DIAGNOSIS — E785 Hyperlipidemia, unspecified: Secondary | ICD-10-CM | POA: Diagnosis present

## 2022-12-25 DIAGNOSIS — J9601 Acute respiratory failure with hypoxia: Secondary | ICD-10-CM | POA: Diagnosis not present

## 2022-12-25 DIAGNOSIS — Z885 Allergy status to narcotic agent status: Secondary | ICD-10-CM

## 2022-12-25 DIAGNOSIS — N1832 Chronic kidney disease, stage 3b: Secondary | ICD-10-CM | POA: Diagnosis not present

## 2022-12-25 DIAGNOSIS — Z91048 Other nonmedicinal substance allergy status: Secondary | ICD-10-CM

## 2022-12-25 DIAGNOSIS — J439 Emphysema, unspecified: Secondary | ICD-10-CM | POA: Diagnosis present

## 2022-12-25 DIAGNOSIS — Z881 Allergy status to other antibiotic agents status: Secondary | ICD-10-CM

## 2022-12-25 DIAGNOSIS — J9621 Acute and chronic respiratory failure with hypoxia: Secondary | ICD-10-CM | POA: Diagnosis present

## 2022-12-25 DIAGNOSIS — C50911 Malignant neoplasm of unspecified site of right female breast: Secondary | ICD-10-CM | POA: Diagnosis not present

## 2022-12-25 DIAGNOSIS — Z86718 Personal history of other venous thrombosis and embolism: Secondary | ICD-10-CM | POA: Diagnosis not present

## 2022-12-25 DIAGNOSIS — R220 Localized swelling, mass and lump, head: Secondary | ICD-10-CM | POA: Diagnosis not present

## 2022-12-25 DIAGNOSIS — R0682 Tachypnea, not elsewhere classified: Secondary | ICD-10-CM | POA: Diagnosis not present

## 2022-12-25 DIAGNOSIS — J168 Pneumonia due to other specified infectious organisms: Secondary | ICD-10-CM | POA: Diagnosis not present

## 2022-12-25 DIAGNOSIS — J90 Pleural effusion, not elsewhere classified: Secondary | ICD-10-CM | POA: Diagnosis not present

## 2022-12-25 DIAGNOSIS — Z79811 Long term (current) use of aromatase inhibitors: Secondary | ICD-10-CM

## 2022-12-25 DIAGNOSIS — R0602 Shortness of breath: Secondary | ICD-10-CM | POA: Diagnosis not present

## 2022-12-25 DIAGNOSIS — Z7982 Long term (current) use of aspirin: Secondary | ICD-10-CM

## 2022-12-25 DIAGNOSIS — R6884 Jaw pain: Secondary | ICD-10-CM | POA: Diagnosis not present

## 2022-12-25 DIAGNOSIS — R0989 Other specified symptoms and signs involving the circulatory and respiratory systems: Secondary | ICD-10-CM | POA: Diagnosis not present

## 2022-12-25 DIAGNOSIS — D539 Nutritional anemia, unspecified: Secondary | ICD-10-CM | POA: Diagnosis present

## 2022-12-25 DIAGNOSIS — Z7401 Bed confinement status: Secondary | ICD-10-CM | POA: Diagnosis not present

## 2022-12-25 DIAGNOSIS — J811 Chronic pulmonary edema: Secondary | ICD-10-CM | POA: Diagnosis not present

## 2022-12-25 DIAGNOSIS — I10 Essential (primary) hypertension: Secondary | ICD-10-CM | POA: Diagnosis not present

## 2022-12-25 LAB — BRAIN NATRIURETIC PEPTIDE: B Natriuretic Peptide: 222.9 pg/mL — ABNORMAL HIGH (ref 0.0–100.0)

## 2022-12-25 LAB — BASIC METABOLIC PANEL
Anion gap: 12 (ref 5–15)
BUN: 19 mg/dL (ref 8–23)
CO2: 20 mmol/L — ABNORMAL LOW (ref 22–32)
Calcium: 6.9 mg/dL — ABNORMAL LOW (ref 8.9–10.3)
Chloride: 107 mmol/L (ref 98–111)
Creatinine, Ser: 1.24 mg/dL — ABNORMAL HIGH (ref 0.44–1.00)
GFR, Estimated: 43 mL/min — ABNORMAL LOW (ref >60)
Glucose, Bld: 115 mg/dL — ABNORMAL HIGH (ref 70–99)
Potassium: 2.8 mmol/L — ABNORMAL LOW (ref 3.5–5.1)
Sodium: 139 mmol/L (ref 135–145)

## 2022-12-25 LAB — CBC WITH DIFFERENTIAL/PLATELET
Abs Immature Granulocytes: 0.1 10*3/uL — ABNORMAL HIGH (ref 0.00–0.07)
Basophils Absolute: 0 10*3/uL (ref 0.0–0.1)
Basophils Relative: 1 %
Eosinophils Absolute: 0.2 10*3/uL (ref 0.0–0.5)
Eosinophils Relative: 2 %
HCT: 23.8 % — ABNORMAL LOW (ref 36.0–46.0)
Hemoglobin: 7.8 g/dL — ABNORMAL LOW (ref 12.0–15.0)
Immature Granulocytes: 2 %
Lymphocytes Relative: 16 %
Lymphs Abs: 1 10*3/uL (ref 0.7–4.0)
MCH: 33.9 pg (ref 26.0–34.0)
MCHC: 32.8 g/dL (ref 30.0–36.0)
MCV: 103.5 fL — ABNORMAL HIGH (ref 80.0–100.0)
Monocytes Absolute: 0.9 10*3/uL (ref 0.1–1.0)
Monocytes Relative: 15 %
Neutro Abs: 4.2 10*3/uL (ref 1.7–7.7)
Neutrophils Relative %: 64 %
Platelets: 367 10*3/uL (ref 150–400)
RBC: 2.3 MIL/uL — ABNORMAL LOW (ref 3.87–5.11)
RDW: 16.8 % — ABNORMAL HIGH (ref 11.5–15.5)
WBC: 6.5 10*3/uL (ref 4.0–10.5)
nRBC: 0 % (ref 0.0–0.2)

## 2022-12-25 MED ORDER — POTASSIUM CHLORIDE 10 MEQ/100ML IV SOLN
10.0000 meq | Freq: Once | INTRAVENOUS | Status: AC
  Administered 2022-12-26: 10 meq via INTRAVENOUS
  Filled 2022-12-25: qty 100

## 2022-12-25 MED ORDER — FUROSEMIDE 10 MG/ML IJ SOLN
40.0000 mg | Freq: Once | INTRAMUSCULAR | Status: AC
  Administered 2022-12-25: 40 mg via INTRAVENOUS
  Filled 2022-12-25: qty 4

## 2022-12-25 MED ORDER — POTASSIUM CHLORIDE CRYS ER 20 MEQ PO TBCR
40.0000 meq | EXTENDED_RELEASE_TABLET | Freq: Once | ORAL | Status: DC
  Filled 2022-12-25: qty 2

## 2022-12-25 NOTE — ED Provider Notes (Signed)
[place] EMERGENCY DEPARTMENT AT [facility] Provider Note   CSN: [id number] Arrival date & time: 12/25/22  2109     History {Add pertinent medical, surgical, social history, OB history to HPI:1} Chief Complaint  Patient presents with   Shortness of Breath    KEYONIA DEDEAUX is a 82 y.o. female.  82 y/o female with hx of IBS, HLD, recurrent breast cancer with bony mets, DVT (on Eliquis), PSVT, and emphysema presents from [facility] SNF for SOB. Hx of recent hospitalization from 12/04/22-12/16/22 for sepsis 2/2 pneumonia with associated AHRF.  She completed a course of antibiotics while inpatient and was discharged on 2 L via nasal cannula.  It appears that during her admission she was also diagnosed with a pleural effusion and congestive heart failure.  Per discharge instructions, patient was supposed to be started on a diuretic at time of discharge.  Unfortunately, she was not provided a prescription for diuretic and has been experiencing worsening shortness of breath despite use of her supplemental oxygen.  She does not feel that her shortness of breath complaints from her recent admission never really improved.  Had a chest x-ray done at her skilled nursing facility and was transported to the ED for further evaluation.  She denies any new fevers.  Has been off of her Ilda Foil since hospital discharge. Also no longer on chronic dexamethasone.  The history is provided by the patient. No language interpreter was used.  Shortness of Breath      Home Medications Prior to Admission medications   Medication Sig Start Date End Date Taking? Authorizing Provider  acetaminophen (TYLENOL) 500 MG tablet Take 1,000 mg by mouth every 8 (eight) hours as needed for mild pain (pain score 1-3), headache or fever.    [provider]  anastrozole (ARIMIDEX) 1 MG tablet TAKE 1 TABLET(1 MG) BY MOUTH DAILY 12/23/22   Rachel Moulds, MD  apixaban (ELIQUIS) 5 MG TABS tablet Take 1 tablet (5  mg total) by mouth 2 (two) times daily. 12/16/22   Pokhrel, Rebekah Chesterfield, MD  aspirin 81 MG EC tablet Take 81 mg by mouth every morning.    [provider]  budesonide (PULMICORT) 0.25 MG/2ML nebulizer solution Take 2 mLs (0.25 mg total) by nebulization 2 (two) times daily for 5 days. 12/16/22 12/21/22  Pokhrel, Rebekah Chesterfield, MD  CALCIUM CARBONATE-VITAMIN D PO Take 1 tablet by mouth 2 (two) times daily.    [provider]  Cholecalciferol (VITAMIN D3 PO) Take 1 capsule by mouth every morning.    [provider]  diphenhydrAMINE (BENADRYL) 25 MG tablet Take 25 mg by mouth every 6 (six) hours as needed (for bee stings).     [provider]  fulvestrant (FASLODEX) 250 MG/5ML injection Inject 250 mg into the muscle every 28 (twenty-eight) days. 11/23/19   [provider]  loperamide (IMODIUM) 2 MG capsule Take 2-4 mg by mouth See admin instructions. Take 4 mg by mouth after the first loose stool, then 2 mg for each subsequent one. Cannot exceed a sum total of 16 mg/24 hours. Stop if there are no loose stools after 12 hours. 09/27/20   Magrinat, Valentino Hue, MD  loratadine (CLARITIN) 10 MG tablet Take 10 mg by mouth every morning.    [provider]  Multiple Vitamin (MULTIVITAMIN WITH MINERALS) TABS tablet Take 1 tablet by mouth every morning. Centrum    [provider]  omeprazole (PRILOSEC) 20 MG capsule Take 1 capsule by mouth daily Patient taking differently: Take  20 mg by mouth daily before breakfast. Take 1 capsule by mouth daily 09/14/22   [name], MD  palbociclib Ilda Foil) 100 MG tablet Take 1 tablet (100 mg total) by mouth Monday through Friday. Take as directed by MD. Patient taking differently: Take 100 mg by mouth See admin instructions. Take 100 mg by mouth Monday through Friday for 3 consecutive weeks, then OFF for 1 week. Repeat. Take as directed by MD. 03/11/22   Rachel Moulds, MD  prochlorperazine (COMPAZINE) 10 MG tablet Take 1 tablet  (10 mg total) by mouth every 6 (six) hours as needed for nausea or vomiting. Patient not taking: Reported on 12/04/2022 06/26/19   Magrinat, Valentino Hue, MD  triamcinolone (NASACORT) 55 MCG/ACT AERO nasal inhaler Place 2 sprays into the nose daily. 2 sprays each nostril at night before bedtime Patient taking differently: Place 2 sprays into the nose See admin instructions. Instill 2 sprays into each nostril every night before bedtime 10/14/20   Drema Halon, MD  vitamin B-12 (CYANOCOBALAMIN) 100 MCG tablet Take 100 mcg by mouth daily.    [provider]  zolpidem (AMBIEN) 10 MG tablet Take 1 tablet (10 mg total) by mouth at bedtime as needed for sleep. Patient taking differently: Take 5-10 mg by mouth at bedtime as needed for sleep. 10/07/22   [name], MD      Allergies    Other, Tape, Aminoglycosides, Bacitracin, Bee venom, Cephalexin, Clindamycin/lincomycin, and Codeine    Review of Systems   Review of Systems  Respiratory:  Positive for shortness of breath.   Ten systems reviewed and are negative for acute change, except as noted in the HPI.    Physical Exam Updated Vital Signs BP (!) 148/73   Pulse 97   Temp 97.8 F (36.6 C) (Oral)   Resp 17   SpO2 97%   Physical Exam Vitals and nursing note reviewed.  Constitutional:      General: She is not in acute distress.    Appearance: She is well-developed. She is not diaphoretic.     Comments: Elderly appearing, nontoxic.  HENT:     Head: Normocephalic and atraumatic.  Eyes:     General: No scleral icterus.    Extraocular Movements: EOM normal.     Conjunctiva/sclera: Conjunctivae normal.  Cardiovascular:     Rate and Rhythm: Normal rate and regular rhythm.     Pulses: Normal pulses.  Pulmonary:     Effort: Pulmonary effort is normal. No respiratory distress.     Comments: Appears dyspneic with tachypnea into the 30's despite 2L via [place]. No respiratory distress. Musculoskeletal:        General: Normal range of  motion.     Cervical back: Normal range of motion.     Comments: Pitting edema BLE. Chronic lymphedema of the LUE.  Skin:    General: Skin is warm and dry.     Coloration: Skin is not pale.     Findings: No erythema or rash.  Neurological:     Mental Status: She is alert and oriented to person, place, and time.     Coordination: Coordination normal.  Psychiatric:        Mood and Affect: Mood and affect normal.        Behavior: Behavior normal.     ED Results / Procedures / Treatments   Labs (all labs ordered are listed, but only abnormal results are displayed) Labs Reviewed  CBC WITH DIFFERENTIAL/PLATELET  BRAIN NATRIURETIC PEPTIDE  BASIC METABOLIC PANEL  URINALYSIS, W/ REFLEX TO CULTURE (INFECTION SUSPECTED)    EKG None  Radiology DG Chest Port 1 View  Result Date: 12/25/2022 CLINICAL DATA:  Shortness of breath. EXAM: PORTABLE CHEST 1 VIEW COMPARISON:  Radiograph 12/12/2022 FINDINGS: Cardiomegaly is stable. There are small bilateral pleural effusions, similar or mildly improved from prior exam. Vascular congestion without edema. No acute airspace disease. No pneumothorax. Left axillary surgical clips. The bones are under mineralized. IMPRESSION: Cardiomegaly with vascular congestion. Small bilateral pleural effusions, similar or mildly improved from prior exam. Electronically Signed   By: Narda Rutherford M.D.   On: 12/25/2022 22:13    Procedures Procedures  {Document cardiac monitor, telemetry assessment procedure when appropriate:1}  Medications Ordered in ED Medications - No data to display  ED Course/ Medical Decision Making/ A&P   {   Click here for ABCD2, HEART and other calculatorsREFRESH Note before signing :1}                              Medical Decision Making  ***  {Document critical care time when appropriate:1} {Document review of labs and clinical decision tools ie heart score, Chads2Vasc2 etc:1}  {Document your independent review of radiology  images, and any outside records:1} {Document your discussion with family members, caretakers, and with consultants:1} {Document social determinants of health affecting pt's care:1} {Document your decision making why or why not admission, treatments were needed:1} Final Clinical Impression(s) / ED Diagnoses Final diagnoses:  None    Rx / DC Orders ED Discharge Orders     None

## 2022-12-25 NOTE — ED Triage Notes (Signed)
Pt arrived via from [facility], labs and xray, pulmonary congestion 30-40. 3l [place]. Some edema low extremities 150/80 BP 102 HR 97 3l 

## 2022-12-26 ENCOUNTER — Encounter (HOSPITAL_COMMUNITY): Payer: Self-pay | Admitting: Family Medicine

## 2022-12-26 DIAGNOSIS — Z170 Estrogen receptor positive status [ER+]: Secondary | ICD-10-CM | POA: Diagnosis not present

## 2022-12-26 DIAGNOSIS — N1832 Chronic kidney disease, stage 3b: Secondary | ICD-10-CM | POA: Diagnosis present

## 2022-12-26 DIAGNOSIS — J9600 Acute respiratory failure, unspecified whether with hypoxia or hypercapnia: Secondary | ICD-10-CM | POA: Diagnosis not present

## 2022-12-26 DIAGNOSIS — D539 Nutritional anemia, unspecified: Secondary | ICD-10-CM | POA: Diagnosis present

## 2022-12-26 DIAGNOSIS — D630 Anemia in neoplastic disease: Secondary | ICD-10-CM

## 2022-12-26 DIAGNOSIS — E872 Acidosis, unspecified: Secondary | ICD-10-CM | POA: Diagnosis present

## 2022-12-26 DIAGNOSIS — J811 Chronic pulmonary edema: Secondary | ICD-10-CM | POA: Diagnosis not present

## 2022-12-26 DIAGNOSIS — K219 Gastro-esophageal reflux disease without esophagitis: Secondary | ICD-10-CM | POA: Diagnosis present

## 2022-12-26 DIAGNOSIS — Z8249 Family history of ischemic heart disease and other diseases of the circulatory system: Secondary | ICD-10-CM | POA: Diagnosis not present

## 2022-12-26 DIAGNOSIS — I5033 Acute on chronic diastolic (congestive) heart failure: Secondary | ICD-10-CM | POA: Diagnosis present

## 2022-12-26 DIAGNOSIS — I10 Essential (primary) hypertension: Secondary | ICD-10-CM | POA: Diagnosis not present

## 2022-12-26 DIAGNOSIS — Z7901 Long term (current) use of anticoagulants: Secondary | ICD-10-CM | POA: Diagnosis not present

## 2022-12-26 DIAGNOSIS — I130 Hypertensive heart and chronic kidney disease with heart failure and stage 1 through stage 4 chronic kidney disease, or unspecified chronic kidney disease: Secondary | ICD-10-CM | POA: Diagnosis present

## 2022-12-26 DIAGNOSIS — Z803 Family history of malignant neoplasm of breast: Secondary | ICD-10-CM | POA: Diagnosis not present

## 2022-12-26 DIAGNOSIS — J9621 Acute and chronic respiratory failure with hypoxia: Secondary | ICD-10-CM

## 2022-12-26 DIAGNOSIS — D649 Anemia, unspecified: Secondary | ICD-10-CM | POA: Diagnosis not present

## 2022-12-26 DIAGNOSIS — R2689 Other abnormalities of gait and mobility: Secondary | ICD-10-CM | POA: Diagnosis not present

## 2022-12-26 DIAGNOSIS — C50212 Malignant neoplasm of upper-inner quadrant of left female breast: Secondary | ICD-10-CM | POA: Diagnosis present

## 2022-12-26 DIAGNOSIS — Z923 Personal history of irradiation: Secondary | ICD-10-CM | POA: Diagnosis not present

## 2022-12-26 DIAGNOSIS — R6884 Jaw pain: Secondary | ICD-10-CM | POA: Diagnosis not present

## 2022-12-26 DIAGNOSIS — R220 Localized swelling, mass and lump, head: Secondary | ICD-10-CM | POA: Diagnosis not present

## 2022-12-26 DIAGNOSIS — R278 Other lack of coordination: Secondary | ICD-10-CM | POA: Diagnosis not present

## 2022-12-26 DIAGNOSIS — M6281 Muscle weakness (generalized): Secondary | ICD-10-CM | POA: Diagnosis not present

## 2022-12-26 DIAGNOSIS — J90 Pleural effusion, not elsewhere classified: Secondary | ICD-10-CM | POA: Diagnosis not present

## 2022-12-26 DIAGNOSIS — Z90710 Acquired absence of both cervix and uterus: Secondary | ICD-10-CM | POA: Diagnosis not present

## 2022-12-26 DIAGNOSIS — E876 Hypokalemia: Secondary | ICD-10-CM

## 2022-12-26 DIAGNOSIS — I517 Cardiomegaly: Secondary | ICD-10-CM | POA: Diagnosis not present

## 2022-12-26 DIAGNOSIS — C50911 Malignant neoplasm of unspecified site of right female breast: Secondary | ICD-10-CM | POA: Diagnosis not present

## 2022-12-26 DIAGNOSIS — D72819 Decreased white blood cell count, unspecified: Secondary | ICD-10-CM | POA: Diagnosis present

## 2022-12-26 DIAGNOSIS — Z9221 Personal history of antineoplastic chemotherapy: Secondary | ICD-10-CM | POA: Diagnosis not present

## 2022-12-26 DIAGNOSIS — Z86718 Personal history of other venous thrombosis and embolism: Secondary | ICD-10-CM | POA: Diagnosis not present

## 2022-12-26 DIAGNOSIS — E8721 Acute metabolic acidosis: Secondary | ICD-10-CM | POA: Diagnosis not present

## 2022-12-26 DIAGNOSIS — R0602 Shortness of breath: Secondary | ICD-10-CM | POA: Diagnosis not present

## 2022-12-26 DIAGNOSIS — Z87891 Personal history of nicotine dependence: Secondary | ICD-10-CM | POA: Diagnosis not present

## 2022-12-26 DIAGNOSIS — J9601 Acute respiratory failure with hypoxia: Secondary | ICD-10-CM | POA: Diagnosis not present

## 2022-12-26 DIAGNOSIS — J439 Emphysema, unspecified: Secondary | ICD-10-CM | POA: Diagnosis present

## 2022-12-26 DIAGNOSIS — E785 Hyperlipidemia, unspecified: Secondary | ICD-10-CM | POA: Diagnosis present

## 2022-12-26 DIAGNOSIS — K589 Irritable bowel syndrome without diarrhea: Secondary | ICD-10-CM | POA: Diagnosis present

## 2022-12-26 DIAGNOSIS — Z7401 Bed confinement status: Secondary | ICD-10-CM | POA: Diagnosis not present

## 2022-12-26 LAB — MAGNESIUM
Magnesium: 0.7 mg/dL — CL (ref 1.7–2.4)
Magnesium: 1.5 mg/dL — ABNORMAL LOW (ref 1.7–2.4)

## 2022-12-26 LAB — BASIC METABOLIC PANEL
Anion gap: 13 (ref 5–15)
BUN: 17 mg/dL (ref 8–23)
CO2: 21 mmol/L — ABNORMAL LOW (ref 22–32)
Calcium: 7.5 mg/dL — ABNORMAL LOW (ref 8.9–10.3)
Chloride: 109 mmol/L (ref 98–111)
Creatinine, Ser: 1.32 mg/dL — ABNORMAL HIGH (ref 0.44–1.00)
GFR, Estimated: 40 mL/min — ABNORMAL LOW (ref >60)
Glucose, Bld: 126 mg/dL — ABNORMAL HIGH (ref 70–99)
Potassium: 3.4 mmol/L — ABNORMAL LOW (ref 3.5–5.1)
Sodium: 143 mmol/L (ref 135–145)

## 2022-12-26 LAB — CBC
HCT: 25.1 % — ABNORMAL LOW (ref 36.0–46.0)
Hemoglobin: 8.2 g/dL — ABNORMAL LOW (ref 12.0–15.0)
MCH: 33.7 pg (ref 26.0–34.0)
MCHC: 32.7 g/dL (ref 30.0–36.0)
MCV: 103.3 fL — ABNORMAL HIGH (ref 80.0–100.0)
Platelets: 413 10*3/uL — ABNORMAL HIGH (ref 150–400)
RBC: 2.43 MIL/uL — ABNORMAL LOW (ref 3.87–5.11)
RDW: 16.9 % — ABNORMAL HIGH (ref 11.5–15.5)
WBC: 8.1 10*3/uL (ref 4.0–10.5)
nRBC: 0 % (ref 0.0–0.2)

## 2022-12-26 LAB — URINALYSIS, W/ REFLEX TO CULTURE (INFECTION SUSPECTED)
Bacteria, UA: NONE SEEN
Bilirubin Urine: NEGATIVE
Glucose, UA: NEGATIVE mg/dL
Hgb urine dipstick: NEGATIVE
Ketones, ur: NEGATIVE mg/dL
Leukocytes,Ua: NEGATIVE
Nitrite: NEGATIVE
Protein, ur: NEGATIVE mg/dL
Specific Gravity, Urine: 1.006 (ref 1.005–1.030)
pH: 5 (ref 5.0–8.0)

## 2022-12-26 LAB — ALBUMIN: Albumin: 3.3 g/dL — ABNORMAL LOW (ref 3.5–5.0)

## 2022-12-26 LAB — PROCALCITONIN: Procalcitonin: <0.1 ng/mL

## 2022-12-26 MED ORDER — TRIAMCINOLONE ACETONIDE 55 MCG/ACT NA AERO
2.0000 | INHALATION_SPRAY | Freq: Every day | NASAL | Status: DC
  Administered 2022-12-26 – 2022-12-28 (×3): 2 via NASAL
  Filled 2022-12-26: qty 21.6

## 2022-12-26 MED ORDER — POTASSIUM CHLORIDE 20 MEQ PO PACK
40.0000 meq | PACK | Freq: Once | ORAL | Status: AC
  Administered 2022-12-26: 40 meq via ORAL

## 2022-12-26 MED ORDER — MAGNESIUM SULFATE 4 GM/100ML IV SOLN
4.0000 g | Freq: Once | INTRAVENOUS | Status: AC
  Administered 2022-12-26: 4 g via INTRAVENOUS
  Filled 2022-12-26: qty 100

## 2022-12-26 MED ORDER — LORATADINE 10 MG PO TABS
10.0000 mg | ORAL_TABLET | Freq: Every morning | ORAL | Status: DC
  Administered 2022-12-26 – 2022-12-29 (×4): 10 mg via ORAL
  Filled 2022-12-26 (×4): qty 1

## 2022-12-26 MED ORDER — ACETAMINOPHEN 325 MG PO TABS
650.0000 mg | ORAL_TABLET | Freq: Three times a day (TID) | ORAL | Status: DC | PRN
  Administered 2022-12-27 – 2022-12-29 (×4): 650 mg via ORAL
  Filled 2022-12-26 (×5): qty 2

## 2022-12-26 MED ORDER — SODIUM CHLORIDE 0.9 % IV SOLN
3.0000 g | Freq: Three times a day (TID) | INTRAVENOUS | Status: DC
  Administered 2022-12-26: 3 g via INTRAVENOUS
  Filled 2022-12-26: qty 8

## 2022-12-26 MED ORDER — VITAMIN B-12 100 MCG PO TABS
100.0000 ug | ORAL_TABLET | Freq: Every day | ORAL | Status: DC
  Administered 2022-12-26 – 2022-12-29 (×4): 100 ug via ORAL
  Filled 2022-12-26 (×4): qty 1

## 2022-12-26 MED ORDER — CALCIUM GLUCONATE-NACL 1-0.675 GM/50ML-% IV SOLN
1.0000 g | Freq: Once | INTRAVENOUS | Status: AC
  Administered 2022-12-26: 1000 mg via INTRAVENOUS
  Filled 2022-12-26: qty 50

## 2022-12-26 MED ORDER — LOPERAMIDE HCL 2 MG PO CAPS
2.0000 mg | ORAL_CAPSULE | ORAL | Status: DC | PRN
  Administered 2022-12-27 – 2022-12-28 (×2): 2 mg via ORAL
  Filled 2022-12-26 (×2): qty 1

## 2022-12-26 MED ORDER — PANTOPRAZOLE SODIUM 40 MG PO TBEC
40.0000 mg | DELAYED_RELEASE_TABLET | Freq: Every day | ORAL | Status: DC
  Administered 2022-12-26 – 2022-12-29 (×4): 40 mg via ORAL
  Filled 2022-12-26 (×4): qty 1

## 2022-12-26 MED ORDER — APIXABAN 5 MG PO TABS
5.0000 mg | ORAL_TABLET | Freq: Two times a day (BID) | ORAL | Status: DC
  Administered 2022-12-26 – 2022-12-29 (×7): 5 mg via ORAL
  Filled 2022-12-26 (×7): qty 1

## 2022-12-26 MED ORDER — MAGNESIUM SULFATE 2 GM/50ML IV SOLN
2.0000 g | Freq: Once | INTRAVENOUS | Status: AC
  Administered 2022-12-26: 2 g via INTRAVENOUS
  Filled 2022-12-26: qty 50

## 2022-12-26 MED ORDER — ANASTROZOLE 1 MG PO TABS
1.0000 mg | ORAL_TABLET | Freq: Every day | ORAL | Status: DC
  Administered 2022-12-26 – 2022-12-29 (×4): 1 mg via ORAL
  Filled 2022-12-26 (×4): qty 1

## 2022-12-26 MED ORDER — POTASSIUM CHLORIDE 20 MEQ PO PACK
40.0000 meq | PACK | Freq: Every day | ORAL | Status: DC
  Filled 2022-12-26: qty 2

## 2022-12-26 MED ORDER — HYDROXYZINE HCL 25 MG PO TABS
25.0000 mg | ORAL_TABLET | Freq: Three times a day (TID) | ORAL | Status: DC | PRN
  Administered 2022-12-28: 25 mg via ORAL
  Filled 2022-12-26: qty 1

## 2022-12-26 MED ORDER — ASPIRIN 81 MG PO TBEC
81.0000 mg | DELAYED_RELEASE_TABLET | Freq: Every morning | ORAL | Status: DC
  Administered 2022-12-26 – 2022-12-29 (×4): 81 mg via ORAL
  Filled 2022-12-26 (×3): qty 1

## 2022-12-26 MED ORDER — ZOLPIDEM TARTRATE 5 MG PO TABS: 5.0000 mg | ORAL_TABLET | Freq: Every evening | ORAL | Status: DC | PRN

## 2022-12-26 MED ORDER — FUROSEMIDE 40 MG PO TABS
40.0000 mg | ORAL_TABLET | Freq: Every day | ORAL | Status: DC
  Administered 2022-12-26 – 2022-12-29 (×4): 40 mg via ORAL
  Filled 2022-12-26 (×4): qty 1

## 2022-12-26 NOTE — ED Notes (Signed)
ED TO INPATIENT HANDOFF REPORT  Name/Age/Gender Stacey [name] 82 y.o. 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) [I50.33]  Level of Care/Admitting Diagnosis ED Disposition     ED Disposition  Admit   Condition  --   Comment  Hospital Area: [facility] [[id number]]  Level of Care: Telemetry [5]  Admit to tele based on following criteria: Acute CHF  May admit patient to [facility] or [facility] if equivalent level of care is available:: No  Covid Evaluation: Asymptomatic - no recent exposure (last 10 days) testing not required  Diagnosis: Acute on chronic diastolic heart failure (HCC) [428.33.ICD-9-CM]  Admitting Physician: Anselm Jungling [[id number]]  Attending Physician: Anselm Jungling [[id number]]  Certification:: I certify this patient will need inpatient services for at least 2 midnights  Expected Medical Readiness: 12/30/2022          Medical History Past Medical History:  Diagnosis Date   ANEMIA-NOS    ANXIETY    Breast cancer (HCC) 1997 L, 2012 R   s/p chemo/xrt   COPD    resolved   DIVERTICULOSIS, COLON 2008   Dizziness    Family history of breast cancer    Family history of lung cancer    Family history of lymphoma    Family history of pancreatic cancer    Family history of uterine cancer    GERD    Hx of radiation therapy 10/19/11 -12/03/11   right breast   HYPERLIPIDEMIA    IRRITABLE BOWEL SYNDROME, HX OF    Left-sided carotid artery disease (HCC)    moderate left ICA stenosis    OSTEOARTHRITIS, HAND    PSVT (paroxysmal supraventricular tachycardia) (HCC)    symptomatic on event monitor    Allergies Allergies  Allergen Reactions   Other Nausea Only and Other (See Comments)    A lot of antibiotics cause extreme nausea   Tape Other (See Comments)    PAPER TAPE IS MUCH PREFERRED   Aminoglycosides Other (See Comments)    Unknown reaction - pt is not sure where this entry came from   Bacitracin Other (See Comments)    Reaction not recalled   Bee Venom Swelling and Other (See Comments)    Severe body swelling   Cephalexin Nausea Only   Clindamycin/Lincomycin Diarrhea and Nausea And Vomiting   Codeine Nausea And Vomiting and Other (See Comments)    Pt can take codeine cough syrup    IV Location/Drains/Wounds Patient Lines/Drains/Airways Status     Active Line/Drains/Airways     Name Placement date Placement time Site Days   Peripheral IV 12/25/22 20 G 1" Right;Anterior Forearm 12/25/22  2240  Forearm  1            Labs/Imaging Results for orders placed or performed during the hospital encounter of 12/25/22 (from the past 48 hour(s))  CBC with Differential/Platelet     Status: Abnormal   Collection Time: 12/25/22  9:37  PM  Result Value Ref Range   WBC 6.5 4.0 - 10.5 K/uL   RBC 2.30 (L) 3.87 - 5.11 MIL/uL   Hemoglobin 7.8 (L) 12.0 - 15.0 g/dL   HCT 78.2 (L) 95.6 - 21.3 %   MCV 103.5 (H) 80.0 - 100.0 fL   MCH 33.9 26.0 - 34.0 pg   MCHC 32.8 30.0 - 36.0 g/dL   RDW 08.6 (H) 57.8 - 46.9 %   Platelets 367 150 - 400 K/uL   nRBC 0.0 0.0 - 0.2 %   Neutrophils Relative % 64 %   Neutro Abs 4.2 1.7 - 7.7 K/uL   Lymphocytes Relative 16 %   Lymphs Abs 1.0 0.7 - 4.0 K/uL   Monocytes Relative 15 %   Monocytes Absolute 0.9 0.1 - 1.0 K/uL   Eosinophils Relative 2 %   Eosinophils Absolute 0.2 0.0 - 0.5 K/uL   Basophils Relative 1 %   Basophils Absolute 0.0 0.0 - 0.1 K/uL   Immature Granulocytes 2 %   Abs Immature Granulocytes 0.10 (H) 0.00 - 0.07 K/uL     Comment: Performed at [facility], [address], [place], [place] [address]  Brain natriuretic peptide     Status: Abnormal   Collection Time: 12/25/22  9:37 PM  Result Value Ref Range   B Natriuretic Peptide 222.9 (H) 0.0 - 100.0 pg/mL    Comment: Performed at [facility], [address], [place], [place] [address]  Basic metabolic panel     Status: Abnormal   Collection Time: 12/25/22  9:37 PM  Result Value Ref Range   Sodium 139 135 - 145 mmol/L   Potassium 2.8 (L) 3.5 - 5.1 mmol/L   Chloride 107 98 - 111 mmol/L   CO2 20 (L) 22 - 32 mmol/L   Glucose, Bld 115 (H) 70 - 99 mg/dL    Comment: Glucose reference range applies only to samples taken after fasting for at least 8 hours.   BUN 19 8 - 23 mg/dL   Creatinine, Ser 4.40 (H) 0.44 - 1.00 mg/dL   Calcium 6.9 (L) 8.9 - 10.3 mg/dL   GFR, Estimated 43 (L) >60 mL/min    Comment: (NOTE) Calculated using the CKD-EPI Creatinine Equation (2021)    Anion gap 12 5 - 15    Comment: Performed at [facility], [address], [place], [place] [address]  Urinalysis, w/ Reflex to Culture (Infection Suspected) -Urine, Clean Catch     Status: Abnormal   Collection Time: 12/26/22 12:55 AM  Result Value Ref Range   Specimen Source URINE, CLEAN CATCH    Color, Urine STRAW (A) YELLOW   APPearance CLEAR CLEAR   Specific Gravity, Urine 1.006 1.005 - 1.030   pH 5.0 5.0 - 8.0   Glucose, UA NEGATIVE NEGATIVE mg/dL   Hgb urine dipstick NEGATIVE NEGATIVE   Bilirubin Urine NEGATIVE NEGATIVE   Ketones, ur NEGATIVE NEGATIVE mg/dL   Protein, ur NEGATIVE NEGATIVE mg/dL   Nitrite NEGATIVE NEGATIVE   Leukocytes,Ua NEGATIVE NEGATIVE   RBC / HPF 0-5 0 - 5 RBC/hpf   WBC, UA 0-5 0 - 5 WBC/hpf    Comment:        Reflex urine culture not performed if WBC <=10, OR if Squamous epithelial cells >5. If Squamous epithelial cells >5 suggest recollection.    Bacteria, UA NONE SEEN NONE SEEN   Squamous  Epithelial / HPF 0-5 0 - 5 /HPF   Mucus PRESENT  Comment: Performed at [facility], [address], [place], [place] [address]   *Note: Due to a large number of results and/or encounters for the requested time period, some results have not been displayed. A complete set of results can be found in Results Review.   DG Chest Port 1 View  Result Date: 12/25/2022 CLINICAL DATA:  Shortness of breath. EXAM: PORTABLE CHEST 1 VIEW COMPARISON:  Radiograph 12/12/2022 FINDINGS: Cardiomegaly is stable. There are small bilateral pleural effusions, similar or mildly improved from prior exam. Vascular congestion without edema. No acute airspace disease. No pneumothorax. Left axillary surgical clips. The bones are under mineralized. IMPRESSION: Cardiomegaly with vascular congestion. Small bilateral pleural effusions, similar or mildly improved from prior exam. Electronically Signed   By: Narda Rutherford M.D.   On: 12/25/2022 22:13    Pending Labs Unresulted Labs (From admission, onward)    None       Vitals/Pain Today's Vitals   12/25/22 2143 12/25/22 2233 12/25/22 2234  BP: (!) 148/73    Pulse: 97    Resp: 17    Temp: 97.8 F (36.6 C)    TempSrc: Oral    SpO2: 96%  97%  PainSc:  0-No pain     Isolation Precautions No active isolations  Medications Medications  furosemide (LASIX) injection 40 mg (40 mg Intravenous Given 12/25/22 2355)  potassium chloride 10 mEq in 100 mL IVPB (0 mEq Intravenous Stopped 12/26/22 0107)  potassium chloride (KLOR-CON) packet 40 mEq (40 mEq Oral Given 12/26/22 0022)    Mobility walks

## 2022-12-26 NOTE — Progress Notes (Signed)
Pharmacy Antibiotic Note  Stacey [name] is a 82 y.o. female admitted on 12/25/2022 with aspiration pneumonia.  Pharmacy has been consulted for Unasyn dosing.  Most recent weight in EPIC = 65 kg (12/06/2022) Est CrCl ~ 35 ml/min  Plan: Unasyn 3g IV q8h Follow renal function     Temp (24hrs), Avg:97.9 F (36.6 C), Min:97.8 F (36.6 C), Max:98 F (36.7 C)  Recent Labs  Lab 12/25/22 2137  WBC 6.5  CREATININE 1.24*    CrCl cannot be calculated (Unknown ideal weight.).    Allergies  Allergen Reactions   Other Nausea Only and Other (See Comments)    A lot of antibiotics cause extreme nausea   Tape Other (See Comments)    PAPER TAPE IS MUCH PREFERRED   Aminoglycosides Other (See Comments)    Unknown reaction - pt is not sure where this entry came from   Bacitracin Other (See Comments)    Reaction not recalled   Bee Venom Swelling and Other (See Comments)    Severe body swelling   Cephalexin Nausea Only   Clindamycin/Lincomycin Diarrhea and Nausea And Vomiting   Codeine Nausea And Vomiting and Other (See Comments)    Pt can take codeine cough syrup    Antimicrobials this admission: 11/23 Unasyn >>      Dose adjustments this admission:    Microbiology results:    Thank you for allowing pharmacy to be a part of this patient's care.  Maryellen Pile, PharmD 12/26/2022 2:20 AM

## 2022-12-26 NOTE — H&P (Signed)
History and Physical    Patient: Stacey [name] [id number]:[id number] DOB: [birth year] DOA: 12/25/2022 DOS: the patient was seen and examined on 12/26/2022 PCP: [name], MD  Patient coming from: SNF  Chief Complaint:  Chief Complaint  Patient presents with   Shortness of Breath   HPI: Stacey [name] is a 82 y.o. 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   Patient recently discharged last week after a 13 day stay for acute hypoxic respiratory failure secondary to HCAP, mucous plugging and recurrent aspiration treated with antibiotics.She had persistent shortness of breath and pulmonary was consulted. PO lasix recommended but does not appear a prescription was ever written. She was discharged back to facility with 2L of oxygen supplementation.   Prior to that she was hospitalized mid-October for sepsis secondary to pneumonia.   Before these two hospitalization, pt lived independently at home but now lives at [facility]. States she has felt no improvement. Today her breathing much worse after exercise. Increase bilateral LE edema. Has persistent cough. No fever/chills.   On arrival to ED, she is tachycardia, normotensive on baseline 2L.   CBC with upward trending WBC at 6.5. She typical has leukopenia. Hgb stable at 7.8 which is around baseline.   BMP notable for hypokalemia and hypocalcemia. Creatinine at 1.2 which is much improved from prior.   CXR with cardiomegaly and bilateral pleural effusion that appears improved from prior.   She was started on IV lasix in ED. Hospitalist consulted for management of CHF exacerbation.   Review of Systems: As mentioned in the history of present illness. All other systems reviewed and are negative. Past Medical History:  Diagnosis Date   ANEMIA-NOS    ANXIETY    Breast cancer (HCC) 1997 L,  2012 R   s/p chemo/xrt   COPD    resolved   DIVERTICULOSIS, COLON 2008   Dizziness    Family history of breast cancer    Family history of lung cancer    Family history of lymphoma    Family history of pancreatic cancer    Family history of uterine cancer    GERD    Hx of radiation therapy 10/19/11 -12/03/11   right breast   HYPERLIPIDEMIA    IRRITABLE BOWEL SYNDROME, HX OF    Left-sided carotid artery disease (HCC)    moderate left ICA stenosis   OSTEOARTHRITIS, HAND    PSVT (paroxysmal supraventricular tachycardia) (HCC)    symptomatic on event monitor   Past Surgical History:  Procedure Laterality Date   ABDOMINAL HYSTERECTOMY     APPENDECTOMY     BREAST BIOPSY  11/14/10    r breast: inv, insitu mammary carcinoma w/calcif, er/pr +, her2 -   BREAST SURGERY     lumpectomy   CATARACT EXTRACTION     both eyes   ELECTROPHYSIOLOGIC STUDY N/A 05/10/2015   Procedure: SVT Ablation;  Surgeon: Will Jorja Loa, MD;  Location: [facility];  Service: Cardiovascular;  Laterality: N/A;   HERNIA REPAIR     inguinal herniorrhapy  1984   left   IR US GUIDE BX ASP/DRAIN  05/26/2019   rectal fissure repair     s/p benign breast biopsy  2003   right   s/p left foot surgury  2009   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  spiral fx left foot  2008   no surgury   TMJ ARTHROPLASTY  1989   TONGUE SURGERY     1988- to remove scar tissue growth    TONSILLECTOMY     Social History:  reports that she quit smoking about 6 years ago. Her smoking use included cigarettes. She started smoking about 61 years ago. She has a 54 pack-year smoking history. She has never used smokeless tobacco. She reports current alcohol use. She reports that she does not use drugs.  Allergies  Allergen Reactions   Other Nausea Only and Other (See Comments)    A lot of antibiotics cause extreme nausea   Tape Other (See Comments)    PAPER TAPE IS MUCH PREFERRED   Aminoglycosides Other (See Comments)     Unknown reaction - pt is not sure where this entry came from   Bacitracin Other (See Comments)    Reaction not recalled   Bee Venom Swelling and Other (See Comments)    Severe body swelling   Cephalexin Nausea Only   Clindamycin/Lincomycin Diarrhea and Nausea And Vomiting   Codeine Nausea And Vomiting and Other (See Comments)    Pt can take codeine cough syrup    Family History  Problem Relation Age of Onset   Hypertension Mother    Stroke Mother    Colon polyps Mother    Diabetes Mother    Pancreatic cancer Mother 45   Uterine cancer Mother 25   Lymphoma Brother        burkitts   Lung cancer Paternal Uncle    Lung cancer Maternal Grandmother 57       non-smoker   Lung cancer Maternal Grandfather    Breast cancer Cousin        maternal cousin, dx in her mid 20s   Brain cancer Cousin        maternal cousin's son; dx in his 56s   Testicular cancer Cousin        maternal cousin's son;    Breast cancer Cousin        paternal cousin; dx in her 73s   Breast cancer Cousin        paternal cousin's daughter; dx in 44s; neg genetic testing   Colon cancer Neg Hx    Esophageal cancer Neg Hx    Stomach cancer Neg Hx    Rectal cancer Neg Hx     Prior to Admission medications   Medication Sig Start Date End Date Taking? Authorizing Provider  acetaminophen (TYLENOL) 500 MG tablet Take 1,000 mg by mouth every 8 (eight) hours as needed for mild pain (pain score 1-3), headache or fever.   Yes [provider]  aluminum-magnesium hydroxide 200-200 MG/5ML suspension Take 30 mLs by mouth every 6 (six) hours as needed for indigestion.   Yes [provider]  anastrozole (ARIMIDEX) 1 MG tablet TAKE 1 TABLET(1 MG) BY MOUTH DAILY 12/23/22  Yes Iruku, Burnice Logan, MD  apixaban (ELIQUIS) 5 MG TABS tablet Take 1 tablet (5 mg total) by mouth 2 (two) times daily. 12/16/22  Yes Pokhrel, Laxman, MD  aspirin 81 MG EC tablet Take 81 mg by mouth every morning.   Yes [provider]   Cholecalciferol (VITAMIN D3 PO) Take 25 mcg by mouth every morning.   Yes [provider]  diphenhydrAMINE (BENADRYL) 25 MG tablet Take 25 mg by mouth every 6 (six) hours as needed (for bee stings).    Yes [provider]  loratadine (CLARITIN) 10  MG tablet Take 10 mg by mouth every morning.   Yes [provider]  Multiple Vitamin (MULTIVITAMIN WITH MINERALS) TABS tablet Take 1 tablet by mouth every morning. Centrum   Yes [provider]  omeprazole (PRILOSEC) 20 MG capsule Take 1 capsule by mouth daily Patient taking differently: Take 20 mg by mouth daily before breakfast. Take 1 capsule by mouth daily 09/14/22  Yes [name], MD  palbociclib Ilda Foil) 100 MG tablet Take 1 tablet (100 mg total) by mouth Monday through Friday. Take as directed by MD. Patient taking differently: Take 100 mg by mouth See admin instructions. Take 100 mg by mouth Monday through Friday for 3 consecutive weeks, then OFF for 1 week. Repeat. Take as directed by MD. 03/11/22  Yes Rachel Moulds, MD  prochlorperazine (COMPAZINE) 10 MG tablet Take 1 tablet (10 mg total) by mouth every 6 (six) hours as needed for nausea or vomiting. 06/26/19  Yes Magrinat, Valentino Hue, MD  triamcinolone (NASACORT) 55 MCG/ACT AERO nasal inhaler Place 2 sprays into the nose daily. 2 sprays each nostril at night before bedtime Patient taking differently: Place 2 sprays into the nose See admin instructions. Instill 2 sprays into each nostril every night before bedtime 10/14/20  Yes Drema Halon, MD  vitamin B-12 (CYANOCOBALAMIN) 100 MCG tablet Take 100 mcg by mouth daily.   Yes [provider]  zolpidem (AMBIEN) 10 MG tablet Take 1 tablet (10 mg total) by mouth at bedtime as needed for sleep. 10/07/22  Yes [name], MD  budesonide (PULMICORT) 0.25 MG/2ML nebulizer solution Take 2 mLs (0.25 mg total) by nebulization 2 (two) times daily for 5 days. 12/16/22 12/21/22  Pokhrel, Rebekah Chesterfield, MD  CALCIUM  CARBONATE-VITAMIN D PO Take 1 tablet by mouth 2 (two) times daily.    [provider]  fulvestrant (FASLODEX) 250 MG/5ML injection Inject 250 mg into the muscle every 28 (twenty-eight) days. 11/23/19   [provider]    Physical Exam: Vitals:   12/25/22 2143 12/25/22 2234 12/26/22 0130 12/26/22 0207  BP: (!) 148/73   (!) 146/69  Pulse: 97  (!) 105   Resp: 17   20  Temp: 97.8 F (36.6 C)  98 F (36.7 C) 97.8 F (36.6 C)  TempSrc: Oral  Oral Oral  SpO2: 96% 97% 94% 95%   Constitutional: NAD, calm, comfortable, Chronically ill appearing elder female lying upright in bed with shortness of breath when speaking. Eyes: lids and conjunctivae normal ENMT: Mucous membranes are moist.  Neck: normal, supple Respiratory: clear to auscultation bilaterally, no wheezing, no crackles. No accessory muscle use. Short of breath with speaking. Very labored respiration requiring her to stop even when attempting to get out of bed to bedside commode.  Cardiovascular: Regular rate and rhythm, no murmurs / rubs / gallops. +3 pitting edema bilateral LE up to pre-tibial.  Abdomen: soft, non-tender, non distended  Musculoskeletal: no clubbing / cyanosis. No joint deformity upper and lower extremities.  Normal muscle tone.  Skin: no rashes, lesions, ulcers. No induration Neurologic: CN 2-12 grossly intact.  Psychiatric: Normal judgment and insight. Alert and oriented x 3. Normal mood.   Data Reviewed:  See HPI  Assessment and Plan: * Acute on chronic diastolic heart failure (HCC) - ECHO showed EF of 60-65%, grade 1 DD on 11/2 -pt was evaluated by pulmonology last admission with recommends to continue p.o Lasix but does not appear to have been prescribed to her at discharge -continue IV Lasix 40mg   -follow  intake and output  History of DVT (deep vein thrombosis) -previously on Xarelto but this was switched to Eliquis at last admission due to worsening renal function  Malignant neoplasm  of upper-inner quadrant of left breast in female, estrogen receptor positive (HCC) -continue Anastrozole -Ibrance on hold per oncologist Dr. Al Pimple with her ongoing infection  Hypokalemia -replace with oral and IV. Mg pending.   Acute on chronic hypoxic respiratory failure (HCC) -secondary to CHF exacerbation and possible pneumonia or ?pneumonitis from chronic aspiration. -Discharged on 2L last hospitalization and visibly dyspnea today on 2L -Her WBC is notably elevated at 6.5 and she is typically leukopenic. Will start IV Unasyn empirically and check procalcitonin. Follow repeat WBC in the morning -Continue diuretics as above   Hypocalcemia -give IV Calcium. Check Mg  Anemia -secondary to anemia of chronic disease/malignancy -stable with Hgb of 7.8 with baseline around 7. Required 1 unit pRBC last week.       Advance Care Planning:   Code Status: Full Code   Consults: none  Family Communication: none at bedside  Severity of Illness: The appropriate patient status for this patient is INPATIENT. Inpatient status is judged to be reasonable and necessary in order to provide the required intensity of service to ensure the patient's safety. The patient's presenting symptoms, physical exam findings, and initial radiographic and laboratory data in the context of their chronic comorbidities is felt to place them at high risk for further clinical deterioration. Furthermore, it is not anticipated that the patient will be medically stable for discharge from the hospital within 2 midnights of admission.   * I certify that at the point of admission it is my clinical judgment that the patient will require inpatient hospital care spanning beyond 2 midnights from the point of admission due to high intensity of service, high risk for further deterioration and high frequency of surveillance required.*  Author: Anselm Jungling, DO 12/26/2022 2:41 AM  For on call review [email or web address].

## 2022-12-26 NOTE — Plan of Care (Signed)
Problem: Elimination: Goal: Will not experience complications related to bowel motility Outcome: Progressing   Problem: Skin Integrity: Goal: Risk for impaired skin integrity will decrease Outcome: Progressing

## 2022-12-26 NOTE — Assessment & Plan Note (Signed)
-  secondary to anemia of chronic disease/malignancy -stable with Hgb of 7.8 with baseline around 7. Required 1 unit pRBC last week.

## 2022-12-26 NOTE — Assessment & Plan Note (Signed)
-  continue Anastrozole -Ibrance on hold per oncologist Dr. Al Pimple with her ongoing infection

## 2022-12-26 NOTE — Progress Notes (Signed)
PROGRESS NOTE    Stacey [name]  OZH:[id number] DOB: 1941-01-08 DOA: 12/25/2022 PCP: [name], MD   Brief Narrative: Stacey [name] is a 82 y.o. 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  Patient presented secondary to worsening exertional dyspnea with concern for acute on chronic diastolic heart failure.  Patient started on Lasix IV diuresis for management.   Assessment and Plan:  Acute on chronic diastolic heart failure Appears somewhat mild.  Possibly secondary to patient not receiving Lasix on discharge.  Chest x-ray significant for vascular congestion.  BNP slightly elevated from 2 weeks prior at 222.9 on admission.  Patient started on Lasix IV with transition to Lasix p.o. this morning. -Continue Lasix p.o. and transition to Lasix IV if patient has inadequate diuresis  Acute on chronic respiratory failure Patient reports dyspnea is somewhat close to baseline from prior hospitalization discharge.  Patient was discharged on 2 L/min of supplemental oxygen on discharge last admission.  Patient required a slight increase in oxygen at 3 L/min but is weaned back down to recent baseline. -Continue 2 L/min of supplemental oxygen with possible attempts to wean back to room air if able -Ambulatory pulse ox  History of DVT -Continue Eliquis  Hypocalcemia Calcium of 6.9 on admission. Patient given calcium gluconate with improvement to 7.5. No albumin available. -Check albumin to ensure calcium is not falsely low  Hypomagnesemia Magnesium significantly low at 0.7. -Magnesium sulfate 4 g IV -Recheck magnesium this evening and tomorrow morning  CKD stage IIIb Baseline creatinine is about 1.3 to 1.5. Stable.  Chronic macrocytic anemia Stable.  Stage IV recurrent left breast cancer Ibrance on hold per oncologist. -Continue anastrozole   DVT prophylaxis:  Eliquis Code Status:   Code Status: Full Code Family Communication: None at bedside Disposition Plan: Discharge likely back to SNF pending transition to oral Lasix and improvement of electrolytes   Consultants:  None  Procedures:  None  Antimicrobials: None    Subjective: Patient reports dyspnea with exertion.  She actually states that this is somewhat similar to when she left the hospital after her recent admission for pneumonia.  Objective: BP (!) (P) 146/69 (BP Location: Right Arm)   Pulse (!) (P) 110   Temp (P) 98.4 F (36.9 C) (Oral)   Resp (P) 20   Wt 64.5 kg   SpO2 95%   BMI 25.21 kg/m   Examination:  General exam: Appears calm and comfortable Respiratory system: Clear to auscultation. Respiratory effort normal. Cardiovascular system: S1 & S2 heard, RRR. No murmurs, rubs, gallops or clicks. Gastrointestinal system: Abdomen is nondistended, soft and nontender. Normal bowel sounds heard. Central nervous system: Alert and oriented. No focal neurological deficits. Musculoskeletal: Bilateral lower extremity edema. No calf tenderness Skin: No cyanosis. No rashes Psychiatry: Judgement and insight appear normal. Mood & affect appropriate.    Data Reviewed: I have personally reviewed following labs and imaging studies  CBC Lab Results  Component Value Date   WBC 8.1 12/26/2022   RBC 2.43 (L) 12/26/2022   HGB 8.2 (L) 12/26/2022   HCT 25.1 (L) 12/26/2022   MCV 103.3 (H) 12/26/2022   MCH 33.7 12/26/2022   PLT 413 (H) 12/26/2022   MCHC 32.7 12/26/2022   RDW 16.9 (H) 12/26/2022   LYMPHSABS 1.0 12/25/2022   MONOABS 0.9 12/25/2022   EOSABS 0.2 12/25/2022   BASOSABS 0.0 12/25/2022  Last metabolic panel Lab Results  Component Value Date   NA 143 12/26/2022   K 3.4 (L) 12/26/2022   CL 109 12/26/2022   CO2 21 (L) 12/26/2022   BUN 17 12/26/2022   CREATININE 1.32 (H) 12/26/2022   GLUCOSE 126 (H) 12/26/2022   GFRNONAA 40 (L) 12/26/2022   GFRAA >60  10/26/2019   CALCIUM 7.5 (L) 12/26/2022   PHOS 2.5 12/16/2022   PROT 5.3 (L) 12/09/2022   ALBUMIN 2.6 (L) 12/16/2022   BILITOT 0.5 12/09/2022   ALKPHOS 40 12/09/2022   AST 17 12/09/2022   ALT 11 12/09/2022   ANIONGAP 13 12/26/2022    GFR: Estimated Creatinine Clearance: 29.7 mL/min (A) (by C-G formula based on SCr of 1.32 mg/dL (H)).  No results found for this or any previous visit (from the past 240 hour(s)).    Radiology Studies: DG Chest Port 1 View  Result Date: 12/25/2022 CLINICAL DATA:  Shortness of breath. EXAM: PORTABLE CHEST 1 VIEW COMPARISON:  Radiograph 12/12/2022 FINDINGS: Cardiomegaly is stable. There are small bilateral pleural effusions, similar or mildly improved from prior exam. Vascular congestion without edema. No acute airspace disease. No pneumothorax. Left axillary surgical clips. The bones are under mineralized. IMPRESSION: Cardiomegaly with vascular congestion. Small bilateral pleural effusions, similar or mildly improved from prior exam. Electronically Signed   By: Narda Rutherford M.D.   On: 12/25/2022 22:13      LOS: 0 days    Jacquelin Hawking, MD [facility] 12/26/2022, 7:22 AM   If 7PM-7AM, please contact night-coverage [email or web address]

## 2022-12-26 NOTE — Hospital Course (Signed)
Stacey [name] is a 82 y.o. 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  Patient presented secondary to worsening exertional dyspnea with concern for acute on chronic diastolic heart failure.  Patient started on Lasix IV diuresis for management.

## 2022-12-26 NOTE — Assessment & Plan Note (Signed)
-  give IV Calcium. Check Mg

## 2022-12-26 NOTE — Assessment & Plan Note (Signed)
-   ECHO showed EF of 60-65%, grade 1 DD on 11/2 -pt was evaluated by pulmonology last admission with recommends to continue p.o Lasix but does not appear to have been prescribed to her at discharge -continue IV Lasix 40mg   -follow intake and output

## 2022-12-26 NOTE — Assessment & Plan Note (Signed)
-  secondary to CHF exacerbation and possible pneumonia or ?pneumonitis from chronic aspiration. -Discharged on 2L last hospitalization and visibly dyspnea today on 2L -Her WBC is notably elevated at 6.5 and she is typically leukopenic. Will start IV Unasyn empirically and check procalcitonin. Follow repeat WBC in the morning -Continue diuretics as above

## 2022-12-26 NOTE — Assessment & Plan Note (Signed)
-  replace with oral and IV. Mg pending.

## 2022-12-26 NOTE — Assessment & Plan Note (Signed)
-  previously on Xarelto but this was switched to Eliquis at last admission due to worsening renal function

## 2022-12-26 NOTE — Progress Notes (Signed)
Mobility Specialist - Progress Note  (2L [place]) Pre-mobility: 96 bpm HR, 94% SpO2 During mobility: 128 bpm HR, 91% SpO2 Post-mobility: 111 bpm HR, 93% SPO2   12/26/22 1427  Mobility  Activity Transferred to/from [facility];Ambulated with assistance in hallway  Level of Assistance Standby assist, set-up cues, supervision of patient - no hands on  Assistive Device Front wheel walker  Distance Ambulated (ft) 160 ft  Range of Motion/Exercises Active  Activity Response Tolerated well  Mobility Referral Yes  $Mobility charge 1 Mobility  Mobility Specialist Start Time (ACUTE ONLY) 1410  Mobility Specialist Stop Time (ACUTE ONLY) 1427  Mobility Specialist Time Calculation (min) (ACUTE ONLY) 17 min   Pt was found in bed and agreeable to ambulate. Had x1 seated rest break for about 1 min due to feeling SOB. Pt returned to recliner chair with all needs met. Call bell in reach.  Billey Chang Mobility Specialist

## 2022-12-27 ENCOUNTER — Inpatient Hospital Stay (HOSPITAL_COMMUNITY): Payer: Medicare Other

## 2022-12-27 DIAGNOSIS — I5033 Acute on chronic diastolic (congestive) heart failure: Secondary | ICD-10-CM | POA: Diagnosis not present

## 2022-12-27 LAB — BASIC METABOLIC PANEL
Anion gap: 11 (ref 5–15)
BUN: 15 mg/dL (ref 8–23)
CO2: 22 mmol/L (ref 22–32)
Calcium: 7 mg/dL — ABNORMAL LOW (ref 8.9–10.3)
Chloride: 103 mmol/L (ref 98–111)
Creatinine, Ser: 1.38 mg/dL — ABNORMAL HIGH (ref 0.44–1.00)
GFR, Estimated: 38 mL/min — ABNORMAL LOW (ref >60)
Glucose, Bld: 111 mg/dL — ABNORMAL HIGH (ref 70–99)
Potassium: 3 mmol/L — ABNORMAL LOW (ref 3.5–5.1)
Sodium: 136 mmol/L (ref 135–145)

## 2022-12-27 LAB — MAGNESIUM: Magnesium: 1.9 mg/dL (ref 1.7–2.4)

## 2022-12-27 MED ORDER — METHOCARBAMOL 500 MG PO TABS
500.0000 mg | ORAL_TABLET | Freq: Three times a day (TID) | ORAL | Status: DC | PRN
  Administered 2022-12-27 – 2022-12-29 (×4): 500 mg via ORAL
  Filled 2022-12-27 (×4): qty 1

## 2022-12-27 MED ORDER — POTASSIUM CHLORIDE CRYS ER 20 MEQ PO TBCR
40.0000 meq | EXTENDED_RELEASE_TABLET | ORAL | Status: AC
  Administered 2022-12-27 (×2): 40 meq via ORAL
  Filled 2022-12-27 (×2): qty 2

## 2022-12-27 NOTE — TOC Progression Note (Signed)
Transition of Care [facility]) - Progression Note    Patient Details  Name: Stacey [name] MRN: [id number] Date of Birth: Feb 10, 1940  [facility] of [facility] CM/SW Contact  Georgie Chard, LCSW Phone Number: 12/27/2022, 10:57 AM  Clinical Narrative:    CSW has reached out to [place] at [place] about the patient's return to facility. At this time patient will need a NEW AUTH/ FL2. CSW has made MD aware.         Expected Discharge Plan and Services                                               Social Determinants of Health (SDOH) Interventions SDOH Screenings   Food Insecurity: No Food Insecurity (12/26/2022)  Housing: Patient Declined (12/26/2022)  Transportation Needs: No Transportation Needs (12/26/2022)  Utilities: Not At Risk (12/26/2022)  Alcohol Screen: Low Risk  (11/07/2022)  Depression (PHQ2-9): Low Risk  (11/10/2022)  Financial Resource Strain: Low Risk  (11/07/2022)  Physical Activity: Inactive (11/07/2022)  Social Connections: Socially Isolated (11/07/2022)  Stress: No Stress Concern Present (11/07/2022)  Tobacco Use: Medium Risk (12/04/2022)  Health Literacy: Adequate Health Literacy (11/10/2022)    Readmission Risk Interventions    11/19/2022    1:54 PM  Readmission Risk Prevention Plan  Transportation Screening Complete  PCP or Specialist Appt within 5-7 Days Complete  Home Care Screening Complete  Medication Review (RN CM) Complete

## 2022-12-27 NOTE — TOC Initial Note (Addendum)
Transition of Care [facility]) - Initial/Assessment Note    Patient Details  Name: Stacey [name] MRN: [id number] Date of Birth: 01/14/1941  Transition of Care [facility]) CM/SW Contact:    Georgie Chard, LCSW Phone Number: 12/27/2022, 2:29 PM  Clinical Narrative:                 CSW spoke to patient's daughter to make her aware that patient was medically stable to return to [facility]. CSW informed daughter that facility is aware that the patient will return. TOC will continue to follow for DC needs.   Addend@ 2:38  CSW attempted to call [place] from [facility] at this time N/A CSW also sent text requesting a call back for patient's DC information Patient has Medicare A & B No AUTH /FL2 Completed. TOC will continue to follow.    Addend@ 3:17 pm  CSW has attempted to reach [place] again phone continues to go to VM. Daughter was informed that patient will DC tomorrow. TOC will continue to follow for DC   Expected Discharge Plan: Skilled Nursing Facility Barriers to Discharge: No Barriers Identified   Patient Goals and CMS Choice Patient states their goals for this hospitalization and ongoing recovery are:: Go back to Rehab   Choice offered to / list presented to : Adult Children      Expected Discharge Plan and Services       Living arrangements for the past 2 months: Single Family Home                                      Prior Living Arrangements/Services Living arrangements for the past 2 months: Single Family Home Lives with:: Self, Adult Children                   Activities of Daily Living   ADL Screening (condition at time of admission) Independently performs ADLs?: No Does the patient have a NEW difficulty with bathing/dressing/toileting/self-feeding that is expected to last >3 days?: Yes (Initiates electronic notice to provider for possible OT consult) Does the patient have a NEW difficulty with getting in/out of bed, walking, or climbing stairs that  is expected to last >3 days?: Yes (Initiates electronic notice to provider for possible PT consult) Does the patient have a NEW difficulty with communication that is expected to last >3 days?: No Is the patient deaf or have difficulty hearing?: No Does the patient have difficulty seeing, even when wearing glasses/contacts?: No Does the patient have difficulty concentrating, remembering, or making decisions?: No  Permission Sought/Granted                  Emotional Assessment       Orientation: : Oriented to Self, Oriented to Place, Oriented to  Time, Oriented to Situation Alcohol / Substance Use: Not Applicable Psych Involvement: No (comment)  Admission diagnosis:  Acute on chronic diastolic heart failure (HCC) [I50.33] Hypokalemia [E87.6] Acute on chronic congestive heart failure, unspecified heart failure type [facility]) [I50.9] Patient Active Problem List   Diagnosis Date Noted   Acute on chronic diastolic heart failure (HCC) 12/26/2022   Hypokalemia 12/26/2022   Acute on chronic hypoxic respiratory failure (HCC) 12/05/2022   Metabolic acidosis 12/04/2022   Hypocalcemia 12/04/2022   Fatigue 12/04/2022   Pneumonia 12/04/2022   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   DVT (deep venous thrombosis) (HCC) 12/04/2022   Diarrhea 12/04/2022  Sepsis (HCC) 11/17/2022   History of DVT (deep vein thrombosis) 11/17/2022   Current chronic use of systemic steroids 11/17/2022   Tachypnea 11/17/2022   Pulmonary nodule 11/17/2022   Acute deep vein thrombosis (DVT) of femoral vein of right lower extremity (HCC) 04/13/2022   Acute upper respiratory infection 05/16/2021   Nasal dryness 02/04/2021   Chronic sinusitis 11/25/2020   Allergic rhinitis 07/08/2020   Hypomagnesemia 06/10/2020   Goals of care, counseling/discussion 06/21/2019   Family history of breast cancer    Family history of pancreatic cancer    Family history of uterine cancer    Family history of  lymphoma    Family history of lung cancer    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HCC) 06/02/2019   Recurrent cancer of left breast (HCC) 05/15/2019   Pes anserine bursitis 03/31/2019   HTN (hypertension) 02/10/2019   Hyperglycemia 06/21/2018   Left carpal tunnel syndrome 02/22/2018   Rotator cuff arthropathy of left shoulder 09/20/2017   Arthritis of hand 08/23/2017   Pain in right hand 08/23/2017   Cervical radiculitis 04/09/2017   Left shoulder pain 04/09/2017   Dyspnea on exertion 05/14/2016   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   Lymphedema of left arm 01/14/2016   Left arm swelling 12/25/2015   SVT (supraventricular tachycardia) (HCC)    Left-sided carotid artery disease (HCC) 03/08/2015   Dizziness 01/24/2015   Urinary frequency 01/13/2015   Dizziness and giddiness 01/09/2015   Gallstones 02/21/2013   Malignant neoplasm of upper-inner quadrant of left breast in female, estrogen receptor positive (HCC) 11/28/2012   Muscle cramping 09/12/2012   Right hip pain 09/12/2012   Lower back pain 09/12/2012   COPD GOLD I     Hx of radiation therapy    Right shoulder pain 09/14/2011   Preventative health care 07/29/2010   Skin lesion of left leg 07/29/2010   Paresthesia 07/29/2010   GERD 03/28/2010   Constipation 03/28/2010   Osteoarthrosis, 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   Anxiety state 05/03/2009   Hyperlipidemia 06/14/2007   FATIGUE 06/14/2007   Anemia 12/17/2006   Diverticulosis of colon 12/17/2006   Cough 12/17/2006   IRRITABLE BOWEL SYNDROME, HX OF 12/17/2006   PCP:  [name], MD Pharmacy:   [facility] [phone] [place], [place] - [address] AT [facility] [address] [facility] [address] [place] [address] Phone: [phone] Fax: [phone]  [place] [facility] [address] [place] [address] Phone: [phone] Fax: [phone]     Social Determinants of Health  (SDOH) Social History: SDOH Screenings   Food Insecurity: No Food Insecurity (12/26/2022)  Housing: Patient Declined (12/26/2022)  Transportation Needs: No Transportation Needs (12/26/2022)  Utilities: Not At Risk (12/26/2022)  Alcohol Screen: Low Risk  (11/07/2022)  Depression (PHQ2-9): Low Risk  (11/10/2022)  Financial Resource Strain: Low Risk  (11/07/2022)  Physical Activity: Inactive (11/07/2022)  Social Connections: Socially Isolated (11/07/2022)  Stress: No Stress Concern Present (11/07/2022)  Tobacco Use: Medium Risk (12/04/2022)  Health Literacy: Adequate Health Literacy (11/10/2022)   SDOH Interventions:     Readmission Risk Interventions    12/27/2022    2:25 PM 11/19/2022    1:54 PM  Readmission Risk Prevention Plan  Transportation Screening Complete Complete  PCP or Specialist Appt within 5-7 Days  Complete  Home Care Screening  Complete  Medication Review (RN CM)  Complete  PCP or Specialist appointment within 3-5 days of discharge Complete  SW Recovery Care/Counseling Consult Complete   Skilled Nursing Facility Complete

## 2022-12-27 NOTE — Evaluation (Signed)
Occupational Therapy Evaluation Patient Details Name: Stacey [name] MRN: [id number] DOB: 28-May-1940 Today's Date: 12/27/2022   History of Present Illness Pt is an 82 yo female with recent admission on 11/17/22 for sepsis (discharged home) and admitted again on 12/04/22 for acute hypoxic respiratory failure secondary to aspiration pneumonia and discharged to [facility] for rehab.  Pt admitted 12/25/22 from SNF rehab for Acute on chronic diastolic heart failure and Acute on chronic respiratory failure (had been discharged from recent hospitalization on 2L O2).  Pt with hx including but not limited to breast CA with bone mets on chronic immunotherapy, L UE lymphedema, OA, DVT, chronic diarrhea, anemia   Clinical Impression   Patient is a 82 year old female who was admitted for above. Patient was living at home alone prior to October hospitalization. Patient was noted to have decreased functional activity tolerance, decreased endurance, decreased standing balance, decreased safety awareness, and decreased knowledge of AD/AE impacting participation in ADLs. Patient will benefit from continued inpatient follow up therapy, <3 hours/day.         If plan is discharge home, recommend the following: A little help with walking and/or transfers;Assistance with cooking/housework;Help with stairs or ramp for entrance;Assist for transportation;A little help with bathing/dressing/bathroom    Functional Status Assessment  Patient has had a recent decline in their functional status and demonstrates the ability to make significant improvements in function in a reasonable and predictable amount of time.  Equipment Recommendations  None recommended by OT       Precautions / Restrictions Precautions Precautions: Fall Precaution Comments: monitor sats, chronic diarrhea, urinary urgency Restrictions Weight Bearing Restrictions: No      Mobility Bed Mobility Overal bed mobility: Needs Assistance Bed  Mobility: Supine to Sit, Sit to Supine     Supine to sit: Supervision Sit to supine: Supervision                Balance Overall balance assessment: Needs assistance Sitting-balance support: No upper extremity supported, Feet supported Sitting balance-Leahy Scale: Good     Standing balance support: Single extremity supported, Reliant on assistive device for balance Standing balance-Leahy Scale: Poor         ADL either performed or assessed with clinical judgement   ADL Overall ADL's : Needs assistance/impaired Eating/Feeding: Independent;Sitting Eating/Feeding Details (indicate cue type and reason): reported having increased pain in R side of jaw with attempting to eat. patient also reporting pain with talking as well with random spasms where patient almost jumps with pain onset. nurse made aware this AM when therapist checked on patient during breakfast and then again after session.   Grooming Details (indicate cue type and reason): patient decliend to do this with increased pain in mouth. Upper Body Bathing: Minimal assistance;Sitting   Lower Body Bathing: Sitting/lateral leans;Moderate assistance   Upper Body Dressing : Minimal assistance;Sitting   Lower Body Dressing: Moderate assistance;Sitting/lateral leans Lower Body Dressing Details (indicate cue type and reason): increased edmea in legs making it more challanging to get BLE up onto lap. patient  educated on not holding breath with movements. poor carryover even with cues Toilet Transfer: Contact guard assist;Rolling walker (2 wheels);Stand-pivot                   Vision Baseline Vision/History: 1 Wears glasses              Pertinent Vitals/Pain Pain Assessment Pain Assessment: 0-10 Pain Score: 10-Worst pain ever Pain Location: right TMJ Pain Descriptors / Indicators:  Stabbing Pain Intervention(s): Limited activity within patient's tolerance, Monitored during session, Repositioned, Patient  requesting pain meds-RN notified     Extremity/Trunk Assessment Upper Extremity Assessment Upper Extremity Assessment: RUE deficits/detail;LUE deficits/detail RUE Deficits / Details: WFL RUE Coordination: decreased fine motor LUE Deficits / Details: Chronic edema due to reported lymphedema. Chronic shoulder AROM limitations. Functional grip strength.  Currently wearing arm sleeve LUE Coordination: decreased fine motor   Lower Extremity Assessment Lower Extremity Assessment: Defer to PT evaluation   Cervical / Trunk Assessment Cervical / Trunk Assessment: Normal   Communication Communication Communication: No apparent difficulties   Cognition Arousal: Alert Behavior During Therapy: WFL for tasks assessed/performed Overall Cognitive Status: Within Functional Limits for tasks assessed         General Comments: patient had some confusion during session calling OT nurse multiple times then reading name tag and correcting herself.                Home Living Family/patient expects to be discharged to:: Skilled nursing facility Living Arrangements: Alone Available Help at Discharge: Family;Available PRN/intermittently Type of Home: House Home Access: Stairs to enter [employer] of Steps: 1   Home Layout: One level     Bathroom Shower/Tub: Chief Strategy Officer: Standard     Home Equipment: BSC/3in1;Shower seat;Rolling Environmental consultant (2 wheels)   Additional Comments: stepdaughter was helping after recent admission      Prior Functioning/Environment Prior Level of Function : Independent/Modified Independent;Driving             Mobility Comments: ambulating with RW, modI prior to last hospitalizatioin, admitted from SNF rehab ADLs Comments: was independent prior to hospitalization in october        OT Problem List: Decreased strength;Decreased activity tolerance;Impaired balance (sitting and/or standing);Decreased knowledge of use of DME or  AE;Cardiopulmonary status limiting activity      OT Treatment/Interventions: Self-care/ADL training;Therapeutic exercise;Energy conservation;DME and/or AE instruction;Therapeutic activities;Balance training;Patient/family education    OT Goals(Current goals can be found in the care plan section) Acute Rehab OT Goals Patient Stated Goal: to get better to go home OT Goal Formulation: With patient Time For Goal Achievement: 01/10/23 Potential to Achieve Goals: Fair  OT Frequency: Min 1X/week       AM-PAC OT "6 Clicks" Daily Activity     Outcome Measure Help from another person eating meals?: None Help from another person taking care of personal grooming?: A Little Help from another person toileting, which includes using toliet, bedpan, or urinal?: A Lot Help from another person bathing (including washing, rinsing, drying)?: A Lot Help from another person to put on and taking off regular upper body clothing?: A Little Help from another person to put on and taking off regular lower body clothing?: A Lot 6 Click Score: 16   End of Session Equipment Utilized During Treatment: Oxygen Nurse Communication: Other (comment);Patient requests pain meds (jaw pain)  Activity Tolerance: Patient tolerated treatment well Patient left: in bed;with call bell/phone within reach  OT Visit Diagnosis: Muscle weakness (generalized) (M62.81);Unsteadiness on feet (R26.81)                Time: 5643-3295 OT Time Calculation (min): 31 min Charges:  OT General Charges $OT Visit: 1 Visit OT Evaluation $OT Eval Low Complexity: 1 Low OT Treatments $Self Care/Home Management : 8-22 mins  Rosalio Loud, MS Acute Rehabilitation Department Office# [phone]   Selinda Flavin 12/27/2022, 12:54 PM

## 2022-12-27 NOTE — Progress Notes (Signed)
PROGRESS NOTE    Stacey [name]  NFA:[id number] DOB: Nov 26, 1940 DOA: 12/25/2022 PCP: [name], MD   Brief Narrative: Stacey [name] is a 82 y.o. 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  Patient presented secondary to worsening exertional dyspnea with concern for acute on chronic diastolic heart failure.  Patient started on Lasix IV diuresis for management.   Assessment and Plan:  Acute on chronic diastolic heart failure Appears somewhat mild.  Possibly secondary to patient not receiving Lasix on discharge.  Chest x-ray significant for vascular congestion.  BNP slightly elevated from 2 weeks prior at 222.9 on admission.  Patient started on Lasix IV with transition to Lasix p.o. this morning. -Continue Lasix p.o. and transition to Lasix IV if patient has inadequate diuresis -Recheck chest x-ray  Acute on chronic respiratory failure Patient reports dyspnea is somewhat close to baseline from prior hospitalization discharge.  Patient was discharged on 2 L/min of supplemental oxygen on discharge last admission.  Patient required a slight increase in oxygen at 3 L/min but is weaned back down to recent baseline. -Continue 2 L/min of supplemental oxygen with possible attempts to wean back to room air if able -Ambulatory pulse ox  History of DVT -Continue Eliquis  Hypocalcemia Calcium of 6.9 on admission. Patient given calcium gluconate with improvement to 7.5. No albumin available. -Check albumin to ensure calcium is not falsely low  Hypomagnesemia Magnesium significantly low at 0.7. Resolved with magnesium repletion.  Hypokalemia -Potassium supplementation  CKD stage IIIb Baseline creatinine is about 1.3 to 1.5. Stable.  Chronic macrocytic anemia Stable.  Stage IV recurrent left breast cancer Ibrance on hold per oncologist. -Continue  anastrozole   DVT prophylaxis: Eliquis Code Status:   Code Status: Full Code Family Communication: None at bedside. Daughter on telephone. Disposition Plan: Discharge likely back to SNF likely in 1-2 days pending bed availability. Anticipate medical readiness this afternoon.   Consultants:  None  Procedures:  None  Antimicrobials: None    Subjective: Patient with dyspnea, but this is improved from admission and back to prior baseline from last hospital admission.  Objective: BP (!) 124/52 (BP Location: Right Arm)   Pulse (!) 107   Temp 98 F (36.7 C) (Oral)   Resp 20   Ht 5\' 3"  (1.6 m)   Wt 62.9 kg   SpO2 93%   BMI 24.56 kg/m   Examination:  General exam: Appears calm and comfortable Respiratory system: Clear to auscultation. Respiratory effort normal. Cardiovascular system: S1 & S2 heard Gastrointestinal system: Abdomen is nondistended, soft and nontender. No organomegaly or masses felt. Normal bowel sounds heard. Central nervous system: Alert and oriented. No focal neurological deficits. Psychiatry: Judgement and insight appear normal. Mood & affect appropriate.    Data Reviewed: I have personally reviewed following labs and imaging studies  CBC Lab Results  Component Value Date   WBC 8.1 12/26/2022   RBC 2.43 (L) 12/26/2022   HGB 8.2 (L) 12/26/2022   HCT 25.1 (L) 12/26/2022   MCV 103.3 (H) 12/26/2022   MCH 33.7 12/26/2022   PLT 413 (H) 12/26/2022   MCHC 32.7 12/26/2022   RDW 16.9 (H) 12/26/2022   LYMPHSABS 1.0 12/25/2022   MONOABS 0.9 12/25/2022   EOSABS 0.2 12/25/2022   BASOSABS 0.0 12/25/2022     Last metabolic panel Lab Results  Component Value Date   NA 136 12/27/2022  K 3.0 (L) 12/27/2022   CL 103 12/27/2022   CO2 22 12/27/2022   BUN 15 12/27/2022   CREATININE 1.38 (H) 12/27/2022   GLUCOSE 111 (H) 12/27/2022   GFRNONAA 38 (L) 12/27/2022   GFRAA >60 10/26/2019   CALCIUM 7.0 (L) 12/27/2022   PHOS 2.5 12/16/2022   PROT 5.3 (L)  12/09/2022   ALBUMIN 3.3 (L) 12/26/2022   BILITOT 0.5 12/09/2022   ALKPHOS 40 12/09/2022   AST 17 12/09/2022   ALT 11 12/09/2022   ANIONGAP 11 12/27/2022    GFR: Estimated Creatinine Clearance: 28.1 mL/min (A) (by C-G formula based on SCr of 1.38 mg/dL (H)).  No results found for this or any previous visit (from the past 240 hour(s)).    Radiology Studies: DG Chest Port 1 View  Result Date: 12/25/2022 CLINICAL DATA:  Shortness of breath. EXAM: PORTABLE CHEST 1 VIEW COMPARISON:  Radiograph 12/12/2022 FINDINGS: Cardiomegaly is stable. There are small bilateral pleural effusions, similar or mildly improved from prior exam. Vascular congestion without edema. No acute airspace disease. No pneumothorax. Left axillary surgical clips. The bones are under mineralized. IMPRESSION: Cardiomegaly with vascular congestion. Small bilateral pleural effusions, similar or mildly improved from prior exam. Electronically Signed   By: Narda Rutherford M.D.   On: 12/25/2022 22:13      LOS: 1 day    Jacquelin Hawking, MD [facility] 12/27/2022, 11:04 AM   If 7PM-7AM, please contact night-coverage [email or web address]

## 2022-12-27 NOTE — [place] FL2 (Signed)
[place] MEDICAID FL2 LEVEL OF CARE FORM     IDENTIFICATION  Patient Name: Stacey [name] Birthdate: 1941-01-04 Sex: female Admission Date (Current Location): 12/25/2022  [facility] and [place] Number:  Producer, television/film/video and Address:  [facility],  [address] [place], [place] [address]      Provider Number: [id number]  Attending Physician Name and Address:  Narda Bonds, MD  Relative Name and Phone Number:  Blondell Reveal [phone]) Mobile    Current Level of Care: Hospital Recommended Level of Care: Skilled Nursing Facility Prior Approval Number:    Date Approved/Denied: 12/27/22 PASRR Number: [id number]  Discharge Plan: SNF    Current Diagnoses: Patient Active Problem List   Diagnosis Date Noted   Acute on chronic diastolic heart failure (HCC) 12/26/2022   Hypokalemia 12/26/2022   Acute on chronic hypoxic respiratory failure (HCC) 12/05/2022   Metabolic acidosis 12/04/2022   Hypocalcemia 12/04/2022   Fatigue 12/04/2022   Pneumonia 12/04/2022   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   DVT (deep venous thrombosis) (HCC) 12/04/2022   Diarrhea 12/04/2022   Sepsis (HCC) 11/17/2022   History of DVT (deep vein thrombosis) 11/17/2022   Current chronic use of systemic steroids 11/17/2022   Tachypnea 11/17/2022   Pulmonary nodule 11/17/2022   Acute deep vein thrombosis (DVT) of femoral vein of right lower extremity (HCC) 04/13/2022   Acute upper respiratory infection 05/16/2021   Nasal dryness 02/04/2021   Chronic sinusitis 11/25/2020   Allergic rhinitis 07/08/2020   Hypomagnesemia 06/10/2020   Goals of care, counseling/discussion 06/21/2019   Family history of breast cancer    Family history of pancreatic cancer    Family history of uterine cancer    Family history of lymphoma    Family history of lung cancer    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HCC) 06/02/2019   Recurrent cancer of left breast (HCC) 05/15/2019   Pes  anserine bursitis 03/31/2019   HTN (hypertension) 02/10/2019   Hyperglycemia 06/21/2018   Left carpal tunnel syndrome 02/22/2018   Rotator cuff arthropathy of left shoulder 09/20/2017   Arthritis of hand 08/23/2017   Pain in right hand 08/23/2017   Cervical radiculitis 04/09/2017   Left shoulder pain 04/09/2017   Dyspnea on exertion 05/14/2016   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   Lymphedema of left arm 01/14/2016   Left arm swelling 12/25/2015   SVT (supraventricular tachycardia) (HCC)    Left-sided carotid artery disease (HCC) 03/08/2015   Dizziness 01/24/2015   Urinary frequency 01/13/2015   Dizziness and giddiness 01/09/2015   Gallstones 02/21/2013   Malignant neoplasm of upper-inner quadrant of left breast in female, estrogen receptor positive (HCC) 11/28/2012   Muscle cramping 09/12/2012   Right hip pain 09/12/2012   Lower back pain 09/12/2012   COPD GOLD I     Hx of radiation therapy    Right shoulder pain 09/14/2011   Preventative health care 07/29/2010   Skin lesion of left leg 07/29/2010   Paresthesia 07/29/2010   GERD 03/28/2010   Constipation 03/28/2010   Osteoarthrosis, 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   Anxiety state 05/03/2009   Hyperlipidemia 06/14/2007   FATIGUE 06/14/2007   Anemia 12/17/2006   Diverticulosis of colon 12/17/2006   Cough 12/17/2006   IRRITABLE BOWEL SYNDROME, HX OF 12/17/2006    Orientation RESPIRATION BLADDER Height & Weight     Self, Time, Situation, Place  O2 Continent Weight: 62.9 kg Height:  5\' 3"  (160 cm)  BEHAVIORAL SYMPTOMS/MOOD  NEUROLOGICAL BOWEL NUTRITION STATUS      Continent Diet (Heart Healthy)  AMBULATORY STATUS COMMUNICATION OF NEEDS Skin   Limited Assist Verbally Normal                       Personal Care Assistance Level of Assistance  Bathing, Feeding, Dressing, Total care (MIN) Bathing Assistance: Limited assistance Feeding assistance: Independent Dressing Assistance: Limited assistance      Functional Limitations Info  Sight Sight Info: Impaired (Glasses) Hearing Info: Adequate Speech Info: Adequate    SPECIAL CARE FACTORS FREQUENCY  PT (By licensed PT), OT (By licensed OT)     PT Frequency: X5 OT Frequency: X5            Contractures Contractures Info: Not present    Additional Factors Info  Code Status, Allergies Code Status Info: FULL Allergies Info: Tape Aminoglycosides Bacitracin Bee Venom Cephalexin Clindamycin/lincomycin Codeine Tape High PAPER TAPE IS MUCH PREFERRED Aminoglycosides Bacitracin Bee Venom Cephalexin Clindamycin/lincomycin Codeine           Current Medications (12/27/2022):  This is the current hospital active medication list Current Facility-Administered Medications  Medication Dose Route Frequency Provider Last Rate Last Admin   acetaminophen (TYLENOL) tablet 650 mg  650 mg Oral Q8H PRN Tu, Ching T, DO   650 mg at 12/27/22 0818   anastrozole (ARIMIDEX) tablet 1 mg  1 mg Oral Daily Tu, Ching T, DO   1 mg at 12/27/22 0817   apixaban (ELIQUIS) tablet 5 mg  5 mg Oral BID Tu, Ching T, DO   5 mg at 12/27/22 4098   aspirin EC tablet 81 mg  81 mg Oral q morning Tu, Ching T, DO   81 mg at 12/27/22 0818   furosemide (LASIX) tablet 40 mg  40 mg Oral Daily Tu, Ching T, DO   40 mg at 12/27/22 1191   hydrOXYzine (ATARAX) tablet 25 mg  25 mg Oral TID PRN Narda Bonds, MD       loperamide (IMODIUM) capsule 2 mg  2 mg Oral PRN Tu, Ching T, DO   2 mg at 12/27/22 1348   loratadine (CLARITIN) tablet 10 mg  10 mg Oral q morning Tu, Ching T, DO   10 mg at 12/27/22 4782   methocarbamol (ROBAXIN) tablet 500 mg  500 mg Oral Q8H PRN Narda Bonds, MD   500 mg at 12/27/22 1306   pantoprazole (PROTONIX) EC tablet 40 mg  40 mg Oral Daily Tu, Ching T, DO   40 mg at 12/27/22 9562   triamcinolone (NASACORT) nasal inhaler 2 spray  2 spray Nasal QHS Tu, Ching T, DO   2 spray at 12/26/22 2105   vitamin B-12 (CYANOCOBALAMIN) tablet 100 mcg  100 mcg Oral Daily Tu,  Ching T, DO   100 mcg at 12/27/22 0818   zolpidem (AMBIEN) tablet 5 mg  5 mg Oral QHS PRN Narda Bonds, MD         Discharge Medications: Please see discharge summary for a list of discharge medications.  Relevant Imaging Results:  Relevant Lab Results:   Additional Information SSN [id number]  Jannette Spanner Patton Swisher, LCSW

## 2022-12-27 NOTE — Evaluation (Signed)
Physical Therapy Evaluation Patient Details Name: Stacey [name] MRN: [id number] DOB: 12-07-1940 Today's Date: 12/27/2022  History of Present Illness  Pt is an 82 yo female with recent admission on 11/17/22 for sepsis (discharged home) and admitted again on 12/04/22 for acute hypoxic respiratory failure secondary to aspiration pneumonia and discharged to [facility] for rehab.  Pt admitted 12/25/22 from SNF rehab for Acute on chronic diastolic heart failure and Acute on chronic respiratory failure (had been discharged from recent hospitalization on 2L O2).  Pt with hx including but not limited to breast CA with bone mets on chronic immunotherapy, L UE lymphedema, OA, DVT, chronic diarrhea, anemia  Clinical Impression  Pt admitted with above diagnosis.  Pt currently with functional limitations due to the deficits listed below (see PT Problem List). Pt will benefit from acute skilled PT to increase their independence and safety with mobility to allow discharge.  Pt very agreeable to mobilize and ambulated in hallway.  Pt continues to require supplemental oxygen.  Pt reports she was slowly progressing at [facility] with rehab and plans to return upon d/c.  SATURATION QUALIFICATIONS: (This note is used to comply with regulatory documentation for home oxygen)  Patient Saturations on Room Air at Rest = 85%  Patient Saturations on Room Air while Ambulating = n/a  Patient Saturations on 2 Liters of oxygen while Ambulating = 91%  Please briefly explain why patient needs home oxygen: to improve oxygen saturations above 88% at rest and during physical activities such as ADLs and ambulation.         If plan is discharge home, recommend the following: Assistance with cooking/housework;Help with stairs or ramp for entrance;A little help with walking and/or transfers;A little help with bathing/dressing/bathroom   Can travel by private vehicle   No    Equipment Recommendations None recommended by PT   Recommendations for Other Services       Functional Status Assessment Patient has had a recent decline in their functional status and demonstrates the ability to make significant improvements in function in a reasonable and predictable amount of time.     Precautions / Restrictions Precautions Precautions: Fall Precaution Comments: monitor sats, chronic diarrhea, urinary urgency      Mobility  Bed Mobility Overal bed mobility: Needs Assistance Bed Mobility: Supine to Sit     Supine to sit: HOB elevated, Used rails, Supervision          Transfers Overall transfer level: Needs assistance Equipment used: Rolling walker (2 wheels) Transfers: Sit to/from Stand Sit to Stand: Contact guard assist           General transfer comment: pt required min cues and CGA for safety with sit to stand    Ambulation/Gait Ambulation/Gait assistance: Contact guard assist Gait Distance (Feet): 120 Feet Assistive device: Rolling walker (2 wheels) Gait Pattern/deviations: Decreased stride length, Step-through pattern Gait velocity: decreased     General Gait Details: cues for rest breaks with pursed lip breathing as needed; SpO2 dropped to 85% on room air so applied 2L O2  and SPO2 improved to 89-93% during ambulation  Stairs            Wheelchair Mobility     Tilt Bed    Modified Rankin (Stroke Patients Only)       Balance Overall balance assessment: Needs assistance   Sitting balance-Leahy Scale: Good     Standing balance support: Single extremity supported, Reliant on assistive device for balance Standing balance-Leahy Scale: Poor Standing balance comment:  static fair, dynamic poor (reliant on UE support)                             Pertinent Vitals/Pain Pain Assessment Pain Assessment: 0-10 Pain Score: 10-Worst pain ever Pain Location: right TMJ Pain Descriptors / Indicators: Stabbing Pain Intervention(s): Repositioned, Monitored during  session, Patient requesting pain meds-RN notified    Home Living Family/patient expects to be discharged to:: Skilled nursing facility Living Arrangements: Alone Available Help at Discharge: Family;Available PRN/intermittently Type of Home: House Home Access: Stairs to enter   [employer] of Steps: 1   Home Layout: One level Home Equipment: BSC/3in1;Shower seat;Rolling Environmental consultant (2 wheels)      Prior Function Prior Level of Function : Independent/Modified Independent;Driving             Mobility Comments: ambulating with RW, modI prior to last hospitalizatioin, admitted from SNF rehab       Extremity/Trunk Assessment   Upper Extremity Assessment Upper Extremity Assessment: Defer to OT evaluation LUE Deficits / Details: Chronic edema due to reported lymphedema. Chronic shoulder AROM limitations. Functional grip strength.  Currently wearing arm sleeve    Lower Extremity Assessment Lower Extremity Assessment: Generalized weakness       Communication   Communication Communication: No apparent difficulties  Cognition Arousal: Alert Behavior During Therapy: WFL for tasks assessed/performed Overall Cognitive Status: Within Functional Limits for tasks assessed                                          General Comments      Exercises     Assessment/Plan    PT Assessment Patient needs continued PT services  PT Problem List Decreased strength;Cardiopulmonary status limiting activity;Decreased range of motion;Decreased activity tolerance;Decreased balance;Decreased knowledge of use of DME;Decreased safety awareness;Decreased mobility       PT Treatment Interventions DME instruction;Therapeutic exercise;Gait training;Balance training;Neuromuscular re-education;Functional mobility training;Therapeutic activities;Patient/family education    PT Goals (Current goals can be found in the Care Plan section)  Acute Rehab PT Goals PT Goal Formulation:  With patient Time For Goal Achievement: 01/10/23 Potential to Achieve Goals: Good    Frequency Min 1X/week     Co-evaluation               AM-PAC PT "6 Clicks" Mobility  Outcome Measure Help needed turning from your back to your side while in a flat bed without using bedrails?: A Little Help needed moving from lying on your back to sitting on the side of a flat bed without using bedrails?: A Little Help needed moving to and from a bed to a chair (including a wheelchair)?: A Little Help needed standing up from a chair using your arms (e.g., wheelchair or bedside chair)?: A Little Help needed to walk in hospital room?: A Little Help needed climbing 3-5 steps with a railing? : A Lot 6 Click Score: 17    End of Session Equipment Utilized During Treatment: Gait belt;Oxygen Activity Tolerance: Patient tolerated treatment well Patient left: in chair;with call bell/phone within reach   PT Visit Diagnosis: Other abnormalities of gait and mobility (R26.89);Muscle weakness (generalized) (M62.81)    Time: 1130-1150 PT Time Calculation (min) (ACUTE ONLY): 20 min   Charges:   PT Evaluation $PT Eval Low Complexity: 1 Low   PT General Charges $$ ACUTE PT VISIT: 1 Visit  Paulino Door, DPT Physical Therapist Acute Rehabilitation Services Office: [phone]   Stacey [name] [place] 12/27/2022, 12:16 PM

## 2022-12-28 ENCOUNTER — Encounter (HOSPITAL_COMMUNITY): Payer: Self-pay | Admitting: Family Medicine

## 2022-12-28 ENCOUNTER — Inpatient Hospital Stay (HOSPITAL_COMMUNITY): Payer: Medicare Other

## 2022-12-28 DIAGNOSIS — I5033 Acute on chronic diastolic (congestive) heart failure: Secondary | ICD-10-CM | POA: Diagnosis not present

## 2022-12-28 LAB — URINALYSIS, ROUTINE W REFLEX MICROSCOPIC
Bilirubin Urine: NEGATIVE
Glucose, UA: NEGATIVE mg/dL
Hgb urine dipstick: NEGATIVE
Ketones, ur: NEGATIVE mg/dL
Leukocytes,Ua: NEGATIVE
Nitrite: NEGATIVE
Protein, ur: NEGATIVE mg/dL
Specific Gravity, Urine: 1.004 — ABNORMAL LOW (ref 1.005–1.030)
pH: 5 (ref 5.0–8.0)

## 2022-12-28 LAB — POTASSIUM: Potassium: 4.2 mmol/L (ref 3.5–5.1)

## 2022-12-28 LAB — MAGNESIUM: Magnesium: 1.6 mg/dL — ABNORMAL LOW (ref 1.7–2.4)

## 2022-12-28 MED ORDER — PALBOCICLIB 100 MG PO TABS: ORAL_TABLET | ORAL | Status: DC

## 2022-12-28 MED ORDER — FUROSEMIDE 10 MG/ML IJ SOLN
40.0000 mg | Freq: Once | INTRAMUSCULAR | Status: AC
  Administered 2022-12-28: 40 mg via INTRAVENOUS
  Filled 2022-12-28: qty 4

## 2022-12-28 MED ORDER — FUROSEMIDE 40 MG PO TABS: 40.0000 mg | ORAL_TABLET | Freq: Every day | ORAL | Status: DC

## 2022-12-28 MED ORDER — MAGNESIUM SULFATE 4 GM/100ML IV SOLN
4.0000 g | Freq: Once | INTRAVENOUS | Status: AC
  Administered 2022-12-28: 4 g via INTRAVENOUS
  Filled 2022-12-28: qty 100

## 2022-12-28 MED ORDER — METHOCARBAMOL 500 MG PO TABS: 500.0000 mg | ORAL_TABLET | Freq: Three times a day (TID) | ORAL | Status: DC | PRN

## 2022-12-28 MED ORDER — ZOLPIDEM TARTRATE 10 MG PO TABS: 10.0000 mg | ORAL_TABLET | Freq: Every evening | ORAL | 0 refills | Status: DC | PRN

## 2022-12-28 NOTE — Progress Notes (Signed)
Heart Failure Navigator Progress Note  Assessed for Heart & Vascular TOC clinic readiness.  Patient does not meet criteria due to EF 60-65%, with a history of Metastatic Cancer.   Navigator will sign off at this time.   Rhae Hammock, BSN, Scientist, clinical (histocompatibility and immunogenetics) Only

## 2022-12-28 NOTE — Progress Notes (Signed)
Mobility Specialist - Progress Note  Pre-mobility: 98 bpm HR, 96% SpO2 During mobility: 117 bpm HR, 90% SpO2 Post-mobility: 110 bpm HR, 93% SPO2   12/28/22 1101  Mobility  Activity Ambulated with assistance in hallway  Level of Assistance Standby assist, set-up cues, supervision of patient - no hands on  Assistive Device Front wheel walker  Distance Ambulated (ft) 180 ft  Range of Motion/Exercises Active  Activity Response Tolerated well  Mobility Referral Yes  $Mobility charge 1 Mobility  Mobility Specialist Start Time (ACUTE ONLY) 1045  Mobility Specialist Stop Time (ACUTE ONLY) 1101  Mobility Specialist Time Calculation (min) (ACUTE ONLY) 16 min   Pt was found on recliner chair and agreeable to ambulate. Grew fatigued with session. Stated her legs were like "jello". At EOS returned to bed with all needs met. Call bell in reach.  Billey Chang Mobility Specialist

## 2022-12-28 NOTE — TOC Progression Note (Addendum)
Transition of Care [facility]) - Progression Note    Patient Details  Name: Stacey [name] MRN: [id number] Date of Birth: 1940-04-29  [facility] CM/SW Contact  Howell Rucks, RN Phone Number: 12/28/2022, 12:03 PM  Clinical Narrative:  Met with pt at bedside per pt's requesting, pt inquiring if she will be discharged to [facility] today for short term rehab. NCM informed pt there is no dc order at this time. Pt concerned if she does not dc today will she still have bed available at short term rehab facility. NCM discussed with pt that NCM is following the case for discharge and is collaborating with SNF for discharge and bed availability, pt voiced understanding. No further questions/concerns.   13:32pm Teams chat received from attending inquiring if patient could discharge today. Also concern if insurance would pay since she has not stayed 3 overnights. NCM explained to patient that since she had a recent  3 overnight inpatient stay within the past 30 days, this stay would be covered by that inpatient stay. Pt has concerns about discharge. NCM sent teams chat to attending requesting he speak with pt to address her concerns, attending responded he would reach out to pt.    Expected Discharge Plan: Skilled Nursing Facility Barriers to Discharge: No Barriers Identified  Expected Discharge Plan and Services       Living arrangements for the past 2 months: Single Family Home                                       Social Determinants of Health (SDOH) Interventions SDOH Screenings   Food Insecurity: No Food Insecurity (12/26/2022)  Housing: Patient Declined (12/26/2022)  Transportation Needs: No Transportation Needs (12/26/2022)  Utilities: Not At Risk (12/26/2022)  Alcohol Screen: Low Risk  (11/07/2022)  Depression (PHQ2-9): Low Risk  (11/10/2022)  Financial Resource Strain: Low Risk  (11/07/2022)  Physical Activity: Inactive (11/07/2022)  Social Connections: Socially  Isolated (11/07/2022)  Stress: No Stress Concern Present (11/07/2022)  Tobacco Use: Medium Risk (12/28/2022)  Health Literacy: Adequate Health Literacy (11/10/2022)    Readmission Risk Interventions    12/27/2022    2:25 PM 11/19/2022    1:54 PM  Readmission Risk Prevention Plan  Transportation Screening Complete Complete  PCP or Specialist Appt within 5-7 Days  Complete  Home Care Screening  Complete  Medication Review (RN CM)  Complete  PCP or Specialist appointment within 3-5 days of discharge Complete   SW Recovery Care/Counseling Consult Complete   Skilled Nursing Facility Complete

## 2022-12-28 NOTE — Progress Notes (Signed)
Patient with urinary retention. Will hold discharge. Obtain urinalysis. Bladder scan q4 hours for post-void residuals. If ongoing retention, may need foley prior to discharge.  Jacquelin Hawking, MD [facility] 12/28/2022, 5:28 PM

## 2022-12-28 NOTE — Discharge Instructions (Signed)
Stacey [name],  You were in the hospital with heart failure and too much fluid retention causing breathing issues. You have improved back to where you were when you left the hospital recently. I am discharging you on Lasix. Please follow-up with cardiology. Before you left, you developed some TMJ pain. Your CT scan looked fine; no infection or deformity noted. Please continue pain medication and a muscle relaxer. If no improvement in the next few days, your doctor may need to try other methods for management.

## 2022-12-28 NOTE — Consult Note (Signed)
[facility] Liaison Consult Note    12/28/2022  SWAYZIE PISTONE 1940/08/28 [id number]   Primary Care Provider:  [name], MD at [facility] which is listed to provide the transition of care follow up   Insurance: Medicare ACO Reach  Patient was reviewed for less than 30 days readmission extreme high risk score with a 2 day length of stay to return to SNF for rehab.  Patient was screened for hospitalization and on behalf of [facility]  Care Coordination to assess for post hospital community care needs.  Patient is being considered for a skilled nursing facility level of care for post hospital transition.  If the patient goes to a [facility] affiliated [[facility] is  listed] facility then, patient can be followed by [facility]  RN with traditional Medicare and approved Medicare Advantage plans.    Plan: Will alert Community [facility] RN of  returning to post facility care coordination needs to return to community.  For questions or referrals, please contact:  Charlesetta Shanks, RN, BSN, CCM [place]  [facility], Population Health, [facility] Liaison Direct Dial: [phone] or secure chat Email: [email or web address]

## 2022-12-28 NOTE — Plan of Care (Signed)
Problem: Coping: Goal: Level of anxiety will decrease Outcome: Progressing   Problem: Safety: Goal: Ability to remain free from injury will improve Outcome: Progressing   Problem: Skin Integrity: Goal: Risk for impaired skin integrity will decrease Outcome: Progressing   Problem: Activity: Goal: Capacity to carry out activities will improve Outcome: Progressing

## 2022-12-28 NOTE — Discharge Summary (Signed)
Physician Discharge Summary   Patient: Stacey [name] MRN: [id number] DOB: 12-Sep-1940  Admit date:     12/25/2022  Discharge date: 12/29/22  Discharge Physician: Jacquelin Hawking, MD   PCP: [name], MD   Recommendations at discharge:  PCP follow-up Cardiology follow-up  Discharge Diagnoses: Principal Problem:   Acute on chronic diastolic heart failure [facility]) Active Problems:   Malignant neoplasm of upper-inner quadrant of left breast in female, estrogen receptor positive (HCC)   History of DVT (deep vein thrombosis)   Anemia   Hypocalcemia   Acute on chronic hypoxic respiratory failure (HCC)   Hypokalemia  Resolved Problems:   * No resolved hospital problems. *  Hospital Course: Stacey [name] is a 82 y.o. 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  Patient presented secondary to worsening exertional dyspnea with concern for acute on chronic diastolic heart failure.  Patient started on Lasix IV diuresis for management.  Assessment and Plan:  Acute on chronic diastolic heart failure Appears somewhat mild.  Possibly secondary to patient not receiving Lasix on discharge.  Chest x-ray significant for vascular congestion.  BNP slightly elevated from 2 weeks prior at 222.9 on admission.  Patient started on Lasix IV with transition to Lasix p.o. Repeat chest x-ray showed some persistent edema, so patient given another dose of Lasix IV. Continue Lasix PO on discharge; will recommend Lasix PRN for the next few days as renal function returns to baseline, followed by daily dosing. Patient will need cardiology follow-up.   Acute on chronic respiratory failure Patient reports dyspnea is somewhat close to baseline from prior hospitalization discharge.  Patient was discharged on 2 L/min of supplemental oxygen on discharge last admission.  Patient required a slight increase  in oxygen at 3 L/min but is weaned back down to recent baseline of 2 L/min of supplemental oxygen. Recommend continuing to wean to room air as able.   History of DVT Continue Eliquis.  Hypocalcemia Calcium of 6.9 on admission. Patient given calcium gluconate with improvement to 7.5 and trend down to 7 with correction around 7.8. Continue with calcium carbonate and recommendation for repeat BMP in 2-3 days.   Hypomagnesemia Magnesium significantly low at 0.7. Initially resolved with magnesium repletion. Recurrent hypomagnesemia with magnesium down to 1.6. Patient given IV magnesium prior to discharge. Recommend repeat magnesium check in 2-3 days and supplement by mouth if needed.   Hypokalemia Potassium supplementation given. Resolved.   CKD stage IIIb Baseline creatinine is about 1.3 to 1.5. Stable. Worsened to 1.74 after Lasix IV. Recommend repeat BMP in 2-3 days.   Chronic macrocytic anemia Stable.   Stage IV recurrent left breast cancer Ibrance on hold per oncologist. Continue anastrozole.   Consultants: None Procedures performed: None  Disposition: Skilled nursing facility Diet recommendation: Cardiac diet   DISCHARGE MEDICATION: Allergies as of 12/28/2022       Reactions   Other Nausea Only, Other (See Comments)   A lot of antibiotics cause extreme nausea   Tape Other (See Comments)   PAPER TAPE IS MUCH PREFERRED   Aminoglycosides Other (See Comments)   Unknown reaction - pt is not sure where this entry came from   Bacitracin Other (See Comments)   Reaction not recalled   Bee Venom Swelling, Other (See Comments)   Severe body swelling   Cephalexin Nausea Only   Clindamycin/lincomycin Diarrhea, Nausea And Vomiting   Codeine  Nausea And Vomiting, Other (See Comments)   Pt can take codeine cough syrup        Medication List     TAKE these medications    acetaminophen 500 MG tablet Commonly known as: TYLENOL Take 1,000 mg by mouth every 8 (eight) hours as  needed for mild pain (pain score 1-3), headache or fever.   aluminum-magnesium hydroxide 200-200 MG/5ML suspension Take 30 mLs by mouth every 6 (six) hours as needed for indigestion.   anastrozole 1 MG tablet Commonly known as: ARIMIDEX TAKE 1 TABLET(1 MG) BY MOUTH DAILY   apixaban 5 MG Tabs tablet Commonly known as: ELIQUIS Take 1 tablet (5 mg total) by mouth 2 (two) times daily.   aspirin EC 81 MG tablet Take 81 mg by mouth every morning.   budesonide 0.25 MG/2ML nebulizer solution Commonly known as: PULMICORT Take 2 mLs (0.25 mg total) by nebulization 2 (two) times daily for 5 days.   CALCIUM CARBONATE-VITAMIN D PO Take 1 tablet by mouth 2 (two) times daily.   diphenhydrAMINE 25 MG tablet Commonly known as: BENADRYL Take 25 mg by mouth every 6 (six) hours as needed (for bee stings).   fulvestrant 250 MG/5ML injection Commonly known as: FASLODEX Inject 250 mg into the muscle every 28 (twenty-eight) days.   furosemide 40 MG tablet Commonly known as: LASIX Take 1 tablet (40 mg total) by mouth daily. Start taking on: December 29, 2022   loratadine 10 MG tablet Commonly known as: CLARITIN Take 10 mg by mouth every morning.   methocarbamol 500 MG tablet Commonly known as: ROBAXIN Take 1 tablet (500 mg total) by mouth every 8 (eight) hours as needed for up to 7 days for muscle spasms.   multivitamin with minerals Tabs tablet Take 1 tablet by mouth every morning. Centrum   omeprazole 20 MG capsule Commonly known as: PRILOSEC Take 1 capsule by mouth daily What changed:  how much to take how to take this when to take this   palbociclib 100 MG tablet Commonly known as: Ibrance HOLD PER ONCOLOGIST. FOLLOW-UP WITH ONCOLOGIST PRIOR TO RESTARTING What changed: additional instructions   prochlorperazine 10 MG tablet Commonly known as: COMPAZINE Take 1 tablet (10 mg total) by mouth every 6 (six) hours as needed for nausea or vomiting.   triamcinolone 55 MCG/ACT Aero  nasal inhaler Commonly known as: NASACORT Place 2 sprays into the nose daily. 2 sprays each nostril at night before bedtime What changed:  when to take this additional instructions   vitamin B-12 100 MCG tablet Commonly known as: CYANOCOBALAMIN Take 100 mcg by mouth daily.   VITAMIN D3 PO Take 25 mcg by mouth every morning.   zolpidem 10 MG tablet Commonly known as: AMBIEN Take 1 tablet (10 mg total) by mouth at bedtime as needed for sleep.        Follow-up Information     [name], MD. Schedule an appointment as soon as possible for a visit.   Specialties: Internal Medicine, Radiology Contact information: [address] [place] [address] [phone]         [facility]. Schedule an appointment as soon as possible for a visit.   Contact information: [address] [place] [address] [phone]               Discharge Exam: BP (!) 113/50 (BP Location: Right Arm)   Pulse 86   Temp 99.5 F (37.5 C) (Oral)   Resp 16  Ht 5\' 3"  (1.6 m)   Wt 63 kg   SpO2 97%   BMI 24.60 kg/m   General exam: Appears calm and comfortable HEENT: Swelling and tenderness without erythema over right TMJ Respiratory system: Clear to auscultation. Respiratory effort normal. Cardiovascular system: S1 & S2 heard, RRR. No murmurs, rubs, gallops or clicks. Gastrointestinal system: Abdomen is nondistended, soft and nontender. Normal bowel sounds heard. Central nervous system: Alert and oriented. No focal neurological deficits. Musculoskeletal: BLE edema. No calf tenderness Skin: No cyanosis. No rashes Psychiatry: Judgement and insight appear normal. Mood & affect appropriate.   Condition at discharge: stable  The results of significant diagnostics from this hospitalization (including imaging, microbiology, ancillary and laboratory) are listed below for reference.   Imaging Studies: CT MAXILLOFACIAL WO CONTRAST  Result  Date: 12/28/2022 CLINICAL DATA:  Right-sided jaw pain and swelling EXAM: CT MAXILLOFACIAL WITHOUT CONTRAST TECHNIQUE: Multidetector CT imaging of the maxillofacial structures was performed. Multiplanar CT image reconstructions were also generated. RADIATION DOSE REDUCTION: This exam was performed according to the departmental dose-optimization program which includes automated exposure control, adjustment of the mA and/or kV according to patient size and/or use of iterative reconstruction technique. COMPARISON:  12/03/2020 FINDINGS: Osseous: No acute maxillofacial bone fracture. Bony orbital walls are intact. Mandible intact. Temporomandibular joints are aligned without dislocation. Mild degenerative changes of the temporomandibular joints. No erosions. Orbits: Negative. No traumatic or inflammatory finding. Sinuses: Paranasal sinuses and mastoid air cells are clear. Soft tissues: No soft tissues swelling or fluid collections. Limited intracranial: No significant or unexpected finding. IMPRESSION: 1. No acute maxillofacial bone fracture. 2. Mild degenerative changes of the temporomandibular joints. Electronically Signed   By: Duanne Guess D.O.   On: 12/28/2022 15:10   DG Chest 2 View  Result Date: 12/27/2022 CLINICAL DATA:  Shortness of breath. EXAM: CHEST - 2 VIEW COMPARISON:  Chest x-ray dated December 25, 2022. FINDINGS: Stable cardiomediastinal silhouette with mild cardiomegaly and moderate hiatal hernia. Slightly increased mild bibasilar interstitial thickening. Unchanged trace pleural effusions. No consolidation or pneumothorax. No acute osseous abnormality. IMPRESSION: 1. Slightly increased mild bibasilar interstitial edema. Unchanged trace pleural effusions. Electronically Signed   By: Obie Dredge M.D.   On: 12/27/2022 14:10   DG Chest Port 1 View  Result Date: 12/25/2022 CLINICAL DATA:  Shortness of breath. EXAM: PORTABLE CHEST 1 VIEW COMPARISON:  Radiograph 12/12/2022 FINDINGS:  Cardiomegaly is stable. There are small bilateral pleural effusions, similar or mildly improved from prior exam. Vascular congestion without edema. No acute airspace disease. No pneumothorax. Left axillary surgical clips. The bones are under mineralized. IMPRESSION: Cardiomegaly with vascular congestion. Small bilateral pleural effusions, similar or mildly improved from prior exam. Electronically Signed   By: Narda Rutherford M.D.   On: 12/25/2022 22:13   CT RENAL STONE STUDY  Result Date: 12/13/2022 CLINICAL DATA:  Acute renal insufficiency with history of breast cancer. Evaluate for bladder outlet obstruction or hydronephrosis. EXAM: CT ABDOMEN AND PELVIS WITHOUT CONTRAST TECHNIQUE: Multidetector CT imaging of the abdomen and pelvis was performed following the standard protocol without IV contrast. RADIATION DOSE REDUCTION: This exam was performed according to the departmental dose-optimization program which includes automated exposure control, adjustment of the mA and/or kV according to patient size and/or use of iterative reconstruction technique. COMPARISON:  PET 11/13/2022. FINDINGS: Lower chest: Clear lung bases. Mild cardiomegaly with right coronary artery calcification. Small bilateral pleural effusions. Moderate hiatal hernia. Hepatobiliary: Hepatic cysts. A vague high right hepatic lobe 8 mm lesion on 10/02 may have  been present on the prior PET. Measures greater than fluid density. 14 mm gallstone without acute cholecystitis or biliary duct dilatation. Pancreas: Normal, without mass or ductal dilatation. Spleen: Normal in size, without focal abnormality. Adrenals/Urinary Tract: Normal left adrenal gland. Mild right adrenal nodularity is similar to 2015 and likely due to a small adenoma which does not require imaging follow-up. No renal calculi or hydronephrosis. No hydroureter or ureteric calculi. No bladder calculi. No significant bladder distention. Stomach/Bowel: Normal remainder of the stomach.  Extensive colonic diverticulosis. Normal terminal ileum. Normal small bowel. Vascular/Lymphatic: Advanced aortic and branch vessel atherosclerosis. No abdominopelvic adenopathy. Reproductive: Hysterectomy.  No adnexal mass. Other: No significant free fluid.  Mild pelvic floor laxity. Musculoskeletal: Osteopenia. Trace L3-4 anterolisthesis. Convex right lumbar spine curvature. IMPRESSION: 1. No hydronephrosis, bladder distension or explanation for renal failure. 2. Small bilateral pleural effusions. 3. Moderate hiatal hernia 4. Incidental findings, including: Coronary artery atherosclerosis. Aortic Atherosclerosis (ICD10-I70.0). Cholelithiasis. Pelvic floor laxity. 5. Possible 8 mm high right hepatic lobe lesion. Given absence of hypermetabolism in this area on recent PET, felt unlikely to represent metastasis. This can be re-evaluated on routine follow-up. Electronically Signed   By: Jeronimo Greaves M.D.   On: 12/13/2022 17:24   DG Chest Port 1 View  Result Date: 12/12/2022 CLINICAL DATA:  Bilateral pleural effusion. EXAM: PORTABLE CHEST 1 VIEW COMPARISON:  12/11/2022 FINDINGS: The cardio pericardial silhouette is enlarged. Basilar atelectasis with small bilateral pleural effusions. Interstitial markings are diffusely coarsened with chronic features. Bones are diffusely demineralized. Telemetry leads overlie the chest. IMPRESSION: Basilar atelectasis with small bilateral pleural effusions. Electronically Signed   By: Kennith Center M.D.   On: 12/12/2022 10:25   DG Chest Port 1 View  Result Date: 12/11/2022 CLINICAL DATA:  Atelectasis. EXAM: PORTABLE CHEST 1 VIEW COMPARISON:  12/10/2022 FINDINGS: Persistent densities at both lung bases likely represent bilateral pleural effusions with atelectasis. Cannot exclude basilar airspace disease particularly on the right side. Heart size appears within normal limits and stable. The trachea is midline. Atherosclerotic calcifications at the aortic arch. Again noted are  surgical clips overlying the left side of the chest and breast region. IMPRESSION: Persistent densities at both lung bases. Findings are most compatible with bilateral pleural effusions with atelectasis. Cannot exclude airspace disease particularly on the right side. Electronically Signed   By: Richarda Overlie M.D.   On: 12/11/2022 08:21   DG CHEST PORT 1 VIEW  Result Date: 12/10/2022 CLINICAL DATA:  Acute respiratory failure.  Shortness of breath EXAM: PORTABLE CHEST 1 VIEW COMPARISON:  12/08/2022 FINDINGS: Surgical clips of the left breast and left axilla. Patient rotated to the right. Midline trachea. Mild cardiomegaly. Atherosclerosis in the transverse aorta. Similar small bilateral pleural effusions. No pneumothorax. Suspect pulmonary venous congestion. Persistent bibasilar airspace disease. IMPRESSION: No significant change since 12/08/2022. Small bilateral pleural effusions with adjacent airspace disease, most likely atelectasis. Cardiomegaly with mild pulmonary venous congestion. Aortic Atherosclerosis (ICD10-I70.0). Electronically Signed   By: Jeronimo Greaves M.D.   On: 12/10/2022 15:09   US RENAL  Result Date: 12/08/2022 CLINICAL DATA:  Acute kidney injury. EXAM: RENAL / URINARY TRACT ULTRASOUND COMPLETE COMPARISON:  None Available. FINDINGS: Right Kidney: Renal measurements: 8.4 cm x 4.7 cm x 4.1 cm = volume: 84.5 mL. Echogenicity within normal limits. No mass or hydronephrosis visualized. Left Kidney: Renal measurements: 8.5 cm x 4.4 cm x 4.9 cm = volume: 95.1 mL. Echogenicity within normal limits. No mass or hydronephrosis visualized. Bladder: Appears normal for degree of bladder  distention. The bilateral ureteral jets are not visualized. Other: Of incidental note is the presence of a 1.5 cm x 1.2 cm x 1.6 cm cystic appearing structure within the posterior aspect of the right lobe of the liver. No flow is seen within this region on color Doppler evaluation. IMPRESSION: 1. Unremarkable renal ultrasound.  2. Simple hepatic cyst. Electronically Signed   By: Aram Candela M.D.   On: 12/08/2022 19:57   DG Chest 1 View  Result Date: 12/08/2022 CLINICAL DATA:  Cough EXAM: CHEST  1 VIEW COMPARISON:  12/04/2022 plain film and CT FINDINGS: Chin overlies the apices minimally. Midline trachea. Cardiomegaly accentuated by AP portable technique. Small bilateral pleural effusions are new. No pneumothorax. Bibasilar airspace disease. IMPRESSION: Development of small bilateral pleural effusions and bibasilar airspace disease, most likely atelectasis. Infection or aspiration could look similar. Aortic Atherosclerosis (ICD10-I70.0). Electronically Signed   By: Jeronimo Greaves M.D.   On: 12/08/2022 18:05   ECHOCARDIOGRAM COMPLETE  Result Date: 12/05/2022    ECHOCARDIOGRAM REPORT   Patient Name:   JENIENE GRONDAHL Date of Exam: 12/05/2022 Medical Rec #:  [id number]    Height:       63.0 in Accession #:    [id number]   Weight:       138.0 lb Date of Birth:  [birth year]    BSA:          1.652 m Patient Age:    82 years     BP:           108/56 mmHg Patient Gender: F            HR:           90 bpm. Exam Location:  Inpatient Procedure: 2D Echo, Cardiac Doppler and Color Doppler Indications:    CHF Acute Systolic I50.21  History:        Patient has prior history of Echocardiogram examinations, most                 recent 05/25/2017. COPD; Risk Factors:Dyslipidemia.  Sonographer:    Harriette Bouillon RDCS Referring Phys: [id number] [facility] GOEL IMPRESSIONS  1. Left ventricular ejection fraction, by estimation, is 60 to 65%. The left ventricle has normal function. The left ventricle has no regional wall motion abnormalities. There is mild concentric left ventricular hypertrophy. Left ventricular diastolic parameters are consistent with Grade I diastolic dysfunction (impaired relaxation). Elevated left atrial pressure.  2. Right ventricular systolic function is normal. The right ventricular size is normal.  3. The mitral valve is normal in structure.  No evidence of mitral valve regurgitation. No evidence of mitral stenosis.  4. The aortic valve is tricuspid. Aortic valve regurgitation is not visualized. No aortic stenosis is present. FINDINGS  Left Ventricle: Left ventricular ejection fraction, by estimation, is 60 to 65%. The left ventricle has normal function. The left ventricle has no regional wall motion abnormalities. The left ventricular internal cavity size was normal in size. There is  mild concentric left ventricular hypertrophy. Left ventricular diastolic parameters are consistent with Grade I diastolic dysfunction (impaired relaxation). Elevated left atrial pressure. Right Ventricle: The right ventricular size is normal. Right ventricular systolic function is normal. Left Atrium: Left atrial size was normal in size. Right Atrium: Right atrial size was normal in size. Pericardium: There is no evidence of pericardial effusion. Mitral Valve: The mitral valve is normal in structure. Mild mitral annular calcification. No evidence of mitral valve regurgitation. No evidence of mitral valve stenosis. Tricuspid Valve:  The tricuspid valve is normal in structure. Tricuspid valve regurgitation is mild . No evidence of tricuspid stenosis. Aortic Valve: The aortic valve is tricuspid. Aortic valve regurgitation is not visualized. No aortic stenosis is present. Pulmonic Valve: The pulmonic valve was normal in structure. Pulmonic valve regurgitation is trivial. No evidence of pulmonic stenosis. Aorta: The aortic root is normal in size and structure. Venous: The inferior vena cava was not well visualized. IAS/Shunts: The interatrial septum was not well visualized.  LEFT VENTRICLE PLAX 2D LVIDd:         3.30 cm   Diastology LVIDs:         2.10 cm   LV e' medial:    5.44 cm/s LV PW:         1.20 cm   LV E/e' medial:  17.5 LV IVS:        1.20 cm   LV e' lateral:   7.18 cm/s LVOT diam:     2.00 cm   LV E/e' lateral: 13.3 LV SV:         63 LV SV Index:   38 LVOT Area:      3.14 cm  RIGHT VENTRICLE TAPSE (M-mode): 1.5 cm LEFT ATRIUM             Index LA diam:        2.10 cm 1.27 cm/m LA Vol (A2C):   39.2 ml 23.74 ml/m LA Vol (A4C):   49.1 ml 29.73 ml/m LA Biplane Vol: 46.1 ml 27.91 ml/m  AORTIC VALVE LVOT Vmax:   99.20 cm/s LVOT Vmean:  73.400 cm/s LVOT VTI:    0.202 m  AORTA Ao Root diam: 3.00 cm Ao Asc diam:  3.30 cm MITRAL VALVE               TRICUSPID VALVE MV Area (PHT): 3.99 cm    TR Peak grad:   37.5 mmHg MV Decel Time: 190 msec    TR Vmax:        306.00 cm/s MV E velocity: 95.20 cm/s MV A velocity: 99.50 cm/s  SHUNTS MV E/A ratio:  0.96        Systemic VTI:  0.20 m                            Systemic Diam: 2.00 cm Olga Millers MD Electronically signed by Olga Millers MD Signature Date/Time: 12/05/2022/2:27:54 PM    Final    CT Angio Chest PE W and/or Wo Contrast  Result Date: 12/04/2022 CLINICAL DATA:  Shortness of breath EXAM: CT ANGIOGRAPHY CHEST WITH CONTRAST TECHNIQUE: Multidetector CT imaging of the chest was performed using the standard protocol during bolus administration of intravenous contrast. Multiplanar CT image reconstructions and MIPs were obtained to evaluate the vascular anatomy. RADIATION DOSE REDUCTION: This exam was performed according to the departmental dose-optimization program which includes automated exposure control, adjustment of the mA and/or kV according to patient size and/or use of iterative reconstruction technique. CONTRAST:  75mL OMNIPAQUE IOHEXOL 350 MG/ML SOLN COMPARISON:  CTA chest dated November 17, 2022 FINDINGS: Cardiovascular: No evidence of pulmonary embolus. Normal heart size. No pericardial effusion. Normal caliber thoracic aorta with severe atherosclerotic disease. Moderate coronary artery calcifications. Mediastinum/Nodes: Moderate hiatal hernia. Thyroid is unremarkable. No enlarged lymph nodes seen in the chest. Lungs/Pleura: Central airways are patent. Moderate emphysema. Subpleural reticulations of the anterior right  lung, consistent with postradiation change. Mild bilateral bronchial wall thickening. Unchanged clustered nodules  and mucous plugging of the right lower lobe. New clustered solid nodule seen in the lingula. Reference new nodule measuring 4 mm on series 12, image 64. No pleural effusion or pneumothorax. Upper Abdomen: No acute abnormality. Musculoskeletal: Postsurgical changes of the right breast. No aggressive appearing osseous lesions. Review of the MIP images confirms the above findings. IMPRESSION: 1. No evidence of pulmonary embolus. 2. Mild bilateral bronchial wall thickening, consistent with bronchitis. 3. New clustered solid nodules seen in the lingula and unchanged clustered nodules and mucous plugging of the right lower lobe, likely due to infection or aspiration. Recommend follow-up in 3 months to ensure resolution. 4. Moderate hiatal hernia. 5. Aortic Atherosclerosis (ICD10-I70.0) and Emphysema (ICD10-J43.9). Electronically Signed   By: Allegra Lai M.D.   On: 12/04/2022 19:40   DG Chest Port 1 View  Result Date: 12/04/2022 CLINICAL DATA:  Shortness of breath EXAM: PORTABLE CHEST 1 VIEW COMPARISON:  11/17/2022 FINDINGS: Heart and mediastinal contours within normal limits. No confluent airspace opacities or effusions. No acute bony abnormality. IMPRESSION: No active disease. Electronically Signed   By: Charlett Nose M.D.   On: 12/04/2022 18:26    Microbiology: Results for orders placed or performed during the hospital encounter of 12/04/22  Culture, blood (routine x 2)     Status: None   Collection Time: 12/04/22  3:42 PM   Specimen: BLOOD  Result Value Ref Range Status   Specimen Description   Final    BLOOD RIGHT ANTECUBITAL Performed at [facility], [address] [place], [place] [address]    Special Requests   Final    BOTTLES DRAWN AEROBIC AND ANAEROBIC Blood Culture adequate volume Performed at [facility], [address],  [place], [place] [address]    Culture   Final    NO GROWTH 5 DAYS Performed at [facility], [address] [place], [place] [address]    Report Status 12/09/2022 FINAL  Final  Culture, blood (routine x 2)     Status: None   Collection Time: 12/04/22  4:39 PM   Specimen: BLOOD RIGHT HAND  Result Value Ref Range Status   Specimen Description   Final    BLOOD RIGHT HAND Performed at [facility], [address] [place], [place] [address]    Special Requests   Final    BOTTLES DRAWN AEROBIC ONLY Blood Culture results may not be optimal due to an inadequate volume of blood received in culture bottles Performed at [facility], [address] [address] [place], [place] [address]    Culture   Final    NO GROWTH 5 DAYS Performed at [facility], [address] [place], [place] [address]    Report Status 12/09/2022 FINAL  Final  SARS Coronavirus 2 by RT PCR (hospital order, performed in [facility] hospital lab) *cepheid single result test* Anterior Nasal Swab     Status: None   Collection Time: 12/04/22  9:37 PM   Specimen: Anterior Nasal Swab  Result Value Ref Range Status   SARS Coronavirus 2 by RT PCR NEGATIVE NEGATIVE Final    Comment: (NOTE) SARS-CoV-2 target nucleic acids are NOT DETECTED.  The SARS-CoV-2 RNA is generally detectable in upper and lower respiratory specimens during the acute phase of infection. The lowest concentration of SARS-CoV-2 viral copies this assay can detect is 250 copies / mL. A negative result does not preclude SARS-CoV-2 infection and should not be used as the sole basis  for treatment or other patient management decisions.  A negative result may occur with improper specimen collection / handling, submission of specimen other than nasopharyngeal swab, presence of viral mutation(s) within the areas targeted by this assay, and inadequate number of viral copies (<250 copies / mL). A negative result must be combined with  clinical observations, patient history, and epidemiological information.  Fact Sheet for Patients:   RoadLapTop.co.za  Fact Sheet for Healthcare Providers: http://kim-miller.com/  This test is not yet approved or  cleared by the [place] FDA and has been authorized for detection and/or diagnosis of SARS-CoV-2 by FDA under an Emergency Use Authorization (EUA).  This EUA will remain in effect (meaning this test can be used) for the duration of the COVID-19 declaration under Section 564(b)(1) of the Act, 21 U.S.C. section 360bbb-3(b)(1), unless the authorization is terminated or revoked sooner.  Performed at [facility], [address], [place], [place] [address]   Respiratory (~20 pathogens) panel by PCR     Status: None   Collection Time: 12/04/22  9:37 PM   Specimen: Anterior Nasal Swab; Respiratory  Result Value Ref Range Status   Adenovirus NOT DETECTED NOT DETECTED Final   Coronavirus 229E NOT DETECTED NOT DETECTED Final    Comment: (NOTE) The Coronavirus on the Respiratory Panel, DOES NOT test for the novel  Coronavirus (2019 nCoV)    Coronavirus HKU1 NOT DETECTED NOT DETECTED Final   Coronavirus NL63 NOT DETECTED NOT DETECTED Final   Coronavirus OC43 NOT DETECTED NOT DETECTED Final   Metapneumovirus NOT DETECTED NOT DETECTED Final   Rhinovirus / Enterovirus NOT DETECTED NOT DETECTED Final   Influenza A NOT DETECTED NOT DETECTED Final   Influenza B NOT DETECTED NOT DETECTED Final   Parainfluenza Virus 1 NOT DETECTED NOT DETECTED Final   Parainfluenza Virus 2 NOT DETECTED NOT DETECTED Final   Parainfluenza Virus 3 NOT DETECTED NOT DETECTED Final   Parainfluenza Virus 4 NOT DETECTED NOT DETECTED Final   Respiratory Syncytial Virus NOT DETECTED NOT DETECTED Final   Bordetella pertussis NOT DETECTED NOT DETECTED Final   Bordetella Parapertussis NOT DETECTED NOT DETECTED Final   Chlamydophila pneumoniae  NOT DETECTED NOT DETECTED Final   Mycoplasma pneumoniae NOT DETECTED NOT DETECTED Final    Comment: Performed at [facility], [address] [place], [place] [address]  C Difficile Quick Screen w PCR reflex     Status: None   Collection Time: 12/05/22 10:17 PM  Result Value Ref Range Status   C Diff antigen NEGATIVE NEGATIVE Final   C Diff toxin NEGATIVE NEGATIVE Final   C Diff interpretation No C. difficile detected.  Final    Comment: Performed at [facility], [address], [place], [place] [address]   *Note: Due to a large number of results and/or encounters for the requested time period, some results have not been displayed. A complete set of results can be found in Results Review.    Labs: CBC: Recent Labs  Lab 12/25/22 2137 12/26/22 0456  WBC 6.5 8.1  NEUTROABS 4.2  --   HGB 7.8* 8.2*  HCT 23.8* 25.1*  MCV 103.5* 103.3*  PLT 367 413*   Basic Metabolic Panel: Recent Labs  Lab 12/25/22 2137 12/26/22 0456 12/26/22 1654 12/27/22 0425 12/28/22 0345  NA 139 143  --  136  --   K 2.8* 3.4*  --  3.0* 4.2  CL 107 109  --  103  --   CO2 20*  21*  --  22  --   GLUCOSE 115* 126*  --  111*  --   BUN 19 17  --  15  --   CREATININE 1.24* 1.32*  --  1.38*  --   CALCIUM 6.9* 7.5*  --  7.0*  --   MG  --  0.7* 1.5* 1.9 1.6*   Liver Function Tests: Recent Labs  Lab 12/26/22 0804  ALBUMIN 3.3*    Discharge time spent: 35 minutes.  Signed: Jacquelin Hawking, MD [facility] 12/28/2022

## 2022-12-28 NOTE — TOC Transition Note (Signed)
Transition of Care [facility]) - CM/SW Discharge Note   Patient Details  Name: Stacey [name] MRN: [id number] Date of Birth: 1940-12-03  Transition of Care [facility]) CM/SW Contact:  Howell Rucks, RN Phone Number: 12/28/2022, 3:56 PM   Clinical Narrative: DC order to short term rehab at [facility]. NCM call to [place], admissions coordinator, confirmed be available for transfer today, RM 603b, Nurse Report 336 [phone]. PTAR for transport. No further TOC needs identified.       Final next level of care: Skilled Nursing Facility Barriers to Discharge: Barriers Resolved   Patient Goals and CMS Choice CMS Medicare.gov Compare Post Acute Care list provided to:: Patient Choice offered to / list presented to : Patient  Discharge Placement                Patient chooses bed at: [facility] Patient to be transferred to facility by: PTAR Name of family member notified: Pt reports she will notify family herself Patient and family notified of of transfer: 12/28/22  Discharge Plan and Services Additional resources added to the After Visit Summary for                                       Social Determinants of Health (SDOH) Interventions SDOH Screenings   Food Insecurity: No Food Insecurity (12/26/2022)  Housing: Patient Declined (12/26/2022)  Transportation Needs: No Transportation Needs (12/26/2022)  Utilities: Not At Risk (12/26/2022)  Alcohol Screen: Low Risk  (11/07/2022)  Depression (PHQ2-9): Low Risk  (11/10/2022)  Financial Resource Strain: Low Risk  (11/07/2022)  Physical Activity: Inactive (11/07/2022)  Social Connections: Socially Isolated (11/07/2022)  Stress: No Stress Concern Present (11/07/2022)  Tobacco Use: Medium Risk (12/28/2022)  Health Literacy: Adequate Health Literacy (11/10/2022)     Readmission Risk Interventions    12/28/2022    3:55 PM 12/27/2022    2:25 PM 11/19/2022    1:54 PM  Readmission Risk Prevention Plan  Transportation Screening   Complete Complete  PCP or Specialist Appt within 5-7 Days   Complete  Home Care Screening   Complete  Medication Review (RN CM)   Complete  Medication Review (RN Care Manager) Complete    PCP or Specialist appointment within 3-5 days of discharge Complete Complete   HRI or Home Care Consult Complete    SW Recovery Care/Counseling Consult Complete Complete   Palliative Care Screening Not Applicable    Skilled Nursing Facility Complete Complete

## 2022-12-29 DIAGNOSIS — R000 Tachycardia, unspecified: Secondary | ICD-10-CM | POA: Diagnosis not present

## 2022-12-29 DIAGNOSIS — Z7982 Long term (current) use of aspirin: Secondary | ICD-10-CM | POA: Diagnosis not present

## 2022-12-29 DIAGNOSIS — N1832 Chronic kidney disease, stage 3b: Secondary | ICD-10-CM | POA: Diagnosis present

## 2022-12-29 DIAGNOSIS — J9601 Acute respiratory failure with hypoxia: Secondary | ICD-10-CM | POA: Diagnosis not present

## 2022-12-29 DIAGNOSIS — Z86718 Personal history of other venous thrombosis and embolism: Secondary | ICD-10-CM | POA: Diagnosis not present

## 2022-12-29 DIAGNOSIS — C50911 Malignant neoplasm of unspecified site of right female breast: Secondary | ICD-10-CM | POA: Diagnosis not present

## 2022-12-29 DIAGNOSIS — N179 Acute kidney failure, unspecified: Secondary | ICD-10-CM | POA: Diagnosis not present

## 2022-12-29 DIAGNOSIS — Z853 Personal history of malignant neoplasm of breast: Secondary | ICD-10-CM | POA: Diagnosis not present

## 2022-12-29 DIAGNOSIS — Z7951 Long term (current) use of inhaled steroids: Secondary | ICD-10-CM | POA: Diagnosis not present

## 2022-12-29 DIAGNOSIS — Z7901 Long term (current) use of anticoagulants: Secondary | ICD-10-CM | POA: Diagnosis not present

## 2022-12-29 DIAGNOSIS — Z885 Allergy status to narcotic agent status: Secondary | ICD-10-CM | POA: Diagnosis not present

## 2022-12-29 DIAGNOSIS — C7951 Secondary malignant neoplasm of bone: Secondary | ICD-10-CM | POA: Diagnosis present

## 2022-12-29 DIAGNOSIS — I10 Essential (primary) hypertension: Secondary | ICD-10-CM | POA: Diagnosis not present

## 2022-12-29 DIAGNOSIS — J9600 Acute respiratory failure, unspecified whether with hypoxia or hypercapnia: Secondary | ICD-10-CM | POA: Diagnosis not present

## 2022-12-29 DIAGNOSIS — R4182 Altered mental status, unspecified: Secondary | ICD-10-CM | POA: Diagnosis not present

## 2022-12-29 DIAGNOSIS — Z923 Personal history of irradiation: Secondary | ICD-10-CM | POA: Diagnosis not present

## 2022-12-29 DIAGNOSIS — Z800 Family history of malignant neoplasm of digestive organs: Secondary | ICD-10-CM | POA: Diagnosis not present

## 2022-12-29 DIAGNOSIS — Z87891 Personal history of nicotine dependence: Secondary | ICD-10-CM | POA: Diagnosis not present

## 2022-12-29 DIAGNOSIS — J90 Pleural effusion, not elsewhere classified: Secondary | ICD-10-CM | POA: Diagnosis not present

## 2022-12-29 DIAGNOSIS — Z881 Allergy status to other antibiotic agents status: Secondary | ICD-10-CM | POA: Diagnosis not present

## 2022-12-29 DIAGNOSIS — Z823 Family history of stroke: Secondary | ICD-10-CM | POA: Diagnosis not present

## 2022-12-29 DIAGNOSIS — I5033 Acute on chronic diastolic (congestive) heart failure: Secondary | ICD-10-CM | POA: Diagnosis not present

## 2022-12-29 DIAGNOSIS — E8721 Acute metabolic acidosis: Secondary | ICD-10-CM | POA: Diagnosis not present

## 2022-12-29 DIAGNOSIS — R339 Retention of urine, unspecified: Secondary | ICD-10-CM | POA: Diagnosis not present

## 2022-12-29 DIAGNOSIS — R0689 Other abnormalities of breathing: Secondary | ICD-10-CM | POA: Diagnosis not present

## 2022-12-29 DIAGNOSIS — Z79811 Long term (current) use of aromatase inhibitors: Secondary | ICD-10-CM | POA: Diagnosis not present

## 2022-12-29 DIAGNOSIS — I517 Cardiomegaly: Secondary | ICD-10-CM | POA: Diagnosis not present

## 2022-12-29 DIAGNOSIS — J9611 Chronic respiratory failure with hypoxia: Secondary | ICD-10-CM | POA: Diagnosis present

## 2022-12-29 DIAGNOSIS — K219 Gastro-esophageal reflux disease without esophagitis: Secondary | ICD-10-CM | POA: Diagnosis not present

## 2022-12-29 DIAGNOSIS — D649 Anemia, unspecified: Secondary | ICD-10-CM | POA: Diagnosis not present

## 2022-12-29 DIAGNOSIS — R2689 Other abnormalities of gait and mobility: Secondary | ICD-10-CM | POA: Diagnosis not present

## 2022-12-29 DIAGNOSIS — Z888 Allergy status to other drugs, medicaments and biological substances status: Secondary | ICD-10-CM | POA: Diagnosis not present

## 2022-12-29 DIAGNOSIS — R059 Cough, unspecified: Secondary | ICD-10-CM | POA: Diagnosis not present

## 2022-12-29 DIAGNOSIS — R278 Other lack of coordination: Secondary | ICD-10-CM | POA: Diagnosis not present

## 2022-12-29 DIAGNOSIS — Z8249 Family history of ischemic heart disease and other diseases of the circulatory system: Secondary | ICD-10-CM | POA: Diagnosis not present

## 2022-12-29 DIAGNOSIS — Z7401 Bed confinement status: Secondary | ICD-10-CM | POA: Diagnosis not present

## 2022-12-29 DIAGNOSIS — R5383 Other fatigue: Secondary | ICD-10-CM | POA: Diagnosis not present

## 2022-12-29 DIAGNOSIS — M6281 Muscle weakness (generalized): Secondary | ICD-10-CM | POA: Diagnosis not present

## 2022-12-29 DIAGNOSIS — Z91030 Bee allergy status: Secondary | ICD-10-CM | POA: Diagnosis not present

## 2022-12-29 DIAGNOSIS — I5032 Chronic diastolic (congestive) heart failure: Secondary | ICD-10-CM | POA: Diagnosis present

## 2022-12-29 DIAGNOSIS — E785 Hyperlipidemia, unspecified: Secondary | ICD-10-CM | POA: Diagnosis present

## 2022-12-29 DIAGNOSIS — J811 Chronic pulmonary edema: Secondary | ICD-10-CM | POA: Diagnosis not present

## 2022-12-29 DIAGNOSIS — Z83719 Family history of colon polyps, unspecified: Secondary | ICD-10-CM | POA: Diagnosis not present

## 2022-12-29 DIAGNOSIS — Z9981 Dependence on supplemental oxygen: Secondary | ICD-10-CM | POA: Diagnosis not present

## 2022-12-29 LAB — BASIC METABOLIC PANEL
Anion gap: 13 (ref 5–15)
BUN: 18 mg/dL (ref 8–23)
CO2: 23 mmol/L (ref 22–32)
Calcium: 8.2 mg/dL — ABNORMAL LOW (ref 8.9–10.3)
Chloride: 99 mmol/L (ref 98–111)
Creatinine, Ser: 1.74 mg/dL — ABNORMAL HIGH (ref 0.44–1.00)
GFR, Estimated: 29 mL/min — ABNORMAL LOW (ref >60)
Glucose, Bld: 110 mg/dL — ABNORMAL HIGH (ref 70–99)
Potassium: 3.7 mmol/L (ref 3.5–5.1)
Sodium: 135 mmol/L (ref 135–145)

## 2022-12-29 LAB — MAGNESIUM: Magnesium: 2.6 mg/dL — ABNORMAL HIGH (ref 1.7–2.4)

## 2022-12-29 MED ORDER — FUROSEMIDE 40 MG PO TABS: ORAL_TABLET | ORAL | Status: DC

## 2022-12-29 NOTE — TOC Transition Note (Signed)
Transition of Care [facility]) - CM/SW Discharge Note   Patient Details  Name: Stacey [name] MRN: [id number] Date of Birth: 1940-03-25  Transition of Care [facility]) CM/SW Contact:  Howell Rucks, RN Phone Number: 12/29/2022, 11:27 AM   Clinical Narrative: Discharge delay yesterday secondary to urinary retention, now resolved. [place], admissions coordinator at [facility] confirmed bed available for transfer today, RM [id number], Nurse report 336 [phone]. PTAR for transportation. No further TOC needs identified.       Final next level of care: Skilled Nursing Facility Barriers to Discharge: Barriers Resolved   Patient Goals and CMS Choice CMS Medicare.gov Compare Post Acute Care list provided to:: Patient Choice offered to / list presented to : Patient  Discharge Placement                Patient chooses bed at: [facility] Patient to be transferred to facility by: [facility] Name of family member notified: Pt reports she will notify family herself Patient and family notified of of transfer: 12/28/22  Discharge Plan and Services Additional resources added to the After Visit Summary for                                       Social Determinants of Health (SDOH) Interventions SDOH Screenings   Food Insecurity: No Food Insecurity (12/26/2022)  Housing: Patient Declined (12/26/2022)  Transportation Needs: No Transportation Needs (12/26/2022)  Utilities: Not At Risk (12/26/2022)  Alcohol Screen: Low Risk  (11/07/2022)  Depression (PHQ2-9): Low Risk  (11/10/2022)  Financial Resource Strain: Low Risk  (11/07/2022)  Physical Activity: Inactive (11/07/2022)  Social Connections: Socially Isolated (11/07/2022)  Stress: No Stress Concern Present (11/07/2022)  Tobacco Use: Medium Risk (12/28/2022)  Health Literacy: Adequate Health Literacy (11/10/2022)     Readmission Risk Interventions    12/28/2022    3:55 PM 12/27/2022    2:25 PM 11/19/2022    1:54 PM  Readmission Risk Prevention  Plan  Transportation Screening  Complete Complete  PCP or Specialist Appt within 5-7 Days   Complete  Home Care Screening   Complete  Medication Review (RN CM)   Complete  Medication Review (RN Care Manager) Complete    PCP or Specialist appointment within 3-5 days of discharge Complete Complete   HRI or Home Care Consult Complete    SW Recovery Care/Counseling Consult Complete Complete   Palliative Care Screening Not Applicable    Skilled Nursing Facility Complete Complete

## 2022-12-29 NOTE — Progress Notes (Signed)
Patient remains stable for discharge. Discharge summary updated.  Jacquelin Hawking, MD [facility] 12/29/2022, 11:18 AM

## 2022-12-30 ENCOUNTER — Other Ambulatory Visit: Payer: Self-pay

## 2022-12-30 ENCOUNTER — Encounter (HOSPITAL_COMMUNITY): Payer: Self-pay

## 2022-12-30 ENCOUNTER — Emergency Department (HOSPITAL_COMMUNITY): Payer: Medicare Other

## 2022-12-30 ENCOUNTER — Inpatient Hospital Stay (HOSPITAL_COMMUNITY)
Admission: EM | Admit: 2022-12-30 | Discharge: 2023-01-03 | DRG: 683 | Disposition: A | Discharge status: Skilled Nursing Facility | Payer: Medicare Other | Source: Skilled Nursing Facility | Attending: Internal Medicine | Admitting: Internal Medicine

## 2022-12-30 DIAGNOSIS — Z7901 Long term (current) use of anticoagulants: Secondary | ICD-10-CM | POA: Diagnosis not present

## 2022-12-30 DIAGNOSIS — Z800 Family history of malignant neoplasm of digestive organs: Secondary | ICD-10-CM | POA: Diagnosis not present

## 2022-12-30 DIAGNOSIS — Z923 Personal history of irradiation: Secondary | ICD-10-CM

## 2022-12-30 DIAGNOSIS — Z881 Allergy status to other antibiotic agents status: Secondary | ICD-10-CM | POA: Diagnosis not present

## 2022-12-30 DIAGNOSIS — Z9981 Dependence on supplemental oxygen: Secondary | ICD-10-CM | POA: Diagnosis not present

## 2022-12-30 DIAGNOSIS — R531 Weakness: Secondary | ICD-10-CM | POA: Diagnosis not present

## 2022-12-30 DIAGNOSIS — Z833 Family history of diabetes mellitus: Secondary | ICD-10-CM

## 2022-12-30 DIAGNOSIS — Z808 Family history of malignant neoplasm of other organs or systems: Secondary | ICD-10-CM

## 2022-12-30 DIAGNOSIS — R0689 Other abnormalities of breathing: Secondary | ICD-10-CM | POA: Diagnosis not present

## 2022-12-30 DIAGNOSIS — J811 Chronic pulmonary edema: Secondary | ICD-10-CM | POA: Diagnosis not present

## 2022-12-30 DIAGNOSIS — Z853 Personal history of malignant neoplasm of breast: Secondary | ICD-10-CM | POA: Diagnosis not present

## 2022-12-30 DIAGNOSIS — I5032 Chronic diastolic (congestive) heart failure: Secondary | ICD-10-CM | POA: Diagnosis present

## 2022-12-30 DIAGNOSIS — J9601 Acute respiratory failure with hypoxia: Secondary | ICD-10-CM | POA: Diagnosis not present

## 2022-12-30 DIAGNOSIS — Z823 Family history of stroke: Secondary | ICD-10-CM | POA: Diagnosis not present

## 2022-12-30 DIAGNOSIS — Z8249 Family history of ischemic heart disease and other diseases of the circulatory system: Secondary | ICD-10-CM | POA: Diagnosis not present

## 2022-12-30 DIAGNOSIS — I5033 Acute on chronic diastolic (congestive) heart failure: Secondary | ICD-10-CM | POA: Diagnosis not present

## 2022-12-30 DIAGNOSIS — Z91030 Bee allergy status: Secondary | ICD-10-CM

## 2022-12-30 DIAGNOSIS — Z87891 Personal history of nicotine dependence: Secondary | ICD-10-CM | POA: Diagnosis not present

## 2022-12-30 DIAGNOSIS — Z79811 Long term (current) use of aromatase inhibitors: Secondary | ICD-10-CM | POA: Diagnosis not present

## 2022-12-30 DIAGNOSIS — Z885 Allergy status to narcotic agent status: Secondary | ICD-10-CM | POA: Diagnosis not present

## 2022-12-30 DIAGNOSIS — R2689 Other abnormalities of gait and mobility: Secondary | ICD-10-CM | POA: Diagnosis not present

## 2022-12-30 DIAGNOSIS — I10 Essential (primary) hypertension: Secondary | ICD-10-CM | POA: Diagnosis not present

## 2022-12-30 DIAGNOSIS — R339 Retention of urine, unspecified: Secondary | ICD-10-CM | POA: Diagnosis not present

## 2022-12-30 DIAGNOSIS — C50911 Malignant neoplasm of unspecified site of right female breast: Secondary | ICD-10-CM | POA: Diagnosis not present

## 2022-12-30 DIAGNOSIS — J9611 Chronic respiratory failure with hypoxia: Secondary | ICD-10-CM | POA: Diagnosis present

## 2022-12-30 DIAGNOSIS — J90 Pleural effusion, not elsewhere classified: Secondary | ICD-10-CM | POA: Diagnosis not present

## 2022-12-30 DIAGNOSIS — M6281 Muscle weakness (generalized): Secondary | ICD-10-CM | POA: Diagnosis not present

## 2022-12-30 DIAGNOSIS — N1832 Chronic kidney disease, stage 3b: Secondary | ICD-10-CM | POA: Diagnosis present

## 2022-12-30 DIAGNOSIS — N179 Acute kidney failure, unspecified: Secondary | ICD-10-CM | POA: Diagnosis not present

## 2022-12-30 DIAGNOSIS — C7951 Secondary malignant neoplasm of bone: Secondary | ICD-10-CM | POA: Diagnosis present

## 2022-12-30 DIAGNOSIS — E785 Hyperlipidemia, unspecified: Secondary | ICD-10-CM | POA: Diagnosis present

## 2022-12-30 DIAGNOSIS — Z7982 Long term (current) use of aspirin: Secondary | ICD-10-CM | POA: Diagnosis not present

## 2022-12-30 DIAGNOSIS — Z83719 Family history of colon polyps, unspecified: Secondary | ICD-10-CM | POA: Diagnosis not present

## 2022-12-30 DIAGNOSIS — Z7951 Long term (current) use of inhaled steroids: Secondary | ICD-10-CM | POA: Diagnosis not present

## 2022-12-30 DIAGNOSIS — R5383 Other fatigue: Secondary | ICD-10-CM | POA: Diagnosis not present

## 2022-12-30 DIAGNOSIS — Z7401 Bed confinement status: Secondary | ICD-10-CM | POA: Diagnosis not present

## 2022-12-30 DIAGNOSIS — R4182 Altered mental status, unspecified: Secondary | ICD-10-CM | POA: Diagnosis not present

## 2022-12-30 DIAGNOSIS — Z888 Allergy status to other drugs, medicaments and biological substances status: Secondary | ICD-10-CM | POA: Diagnosis not present

## 2022-12-30 DIAGNOSIS — Z86718 Personal history of other venous thrombosis and embolism: Secondary | ICD-10-CM

## 2022-12-30 DIAGNOSIS — R000 Tachycardia, unspecified: Secondary | ICD-10-CM | POA: Diagnosis not present

## 2022-12-30 DIAGNOSIS — I517 Cardiomegaly: Secondary | ICD-10-CM | POA: Diagnosis not present

## 2022-12-30 DIAGNOSIS — K219 Gastro-esophageal reflux disease without esophagitis: Secondary | ICD-10-CM | POA: Diagnosis not present

## 2022-12-30 DIAGNOSIS — R278 Other lack of coordination: Secondary | ICD-10-CM | POA: Diagnosis not present

## 2022-12-30 DIAGNOSIS — E8721 Acute metabolic acidosis: Secondary | ICD-10-CM | POA: Diagnosis not present

## 2022-12-30 DIAGNOSIS — Z8049 Family history of malignant neoplasm of other genital organs: Secondary | ICD-10-CM

## 2022-12-30 DIAGNOSIS — R059 Cough, unspecified: Secondary | ICD-10-CM | POA: Diagnosis not present

## 2022-12-30 DIAGNOSIS — Z803 Family history of malignant neoplasm of breast: Secondary | ICD-10-CM

## 2022-12-30 DIAGNOSIS — Z801 Family history of malignant neoplasm of trachea, bronchus and lung: Secondary | ICD-10-CM

## 2022-12-30 DIAGNOSIS — D649 Anemia, unspecified: Secondary | ICD-10-CM | POA: Diagnosis not present

## 2022-12-30 DIAGNOSIS — Z807 Family history of other malignant neoplasms of lymphoid, hematopoietic and related tissues: Secondary | ICD-10-CM

## 2022-12-30 LAB — COMPREHENSIVE METABOLIC PANEL
ALT: 13 U/L (ref 0–44)
AST: 20 U/L (ref 15–41)
Albumin: 3.4 g/dL — ABNORMAL LOW (ref 3.5–5.0)
Alkaline Phosphatase: 69 U/L (ref 38–126)
Anion gap: 11 (ref 5–15)
BUN: 19 mg/dL (ref 8–23)
CO2: 28 mmol/L (ref 22–32)
Calcium: 9.1 mg/dL (ref 8.9–10.3)
Chloride: 98 mmol/L (ref 98–111)
Creatinine, Ser: 1.89 mg/dL — ABNORMAL HIGH (ref 0.44–1.00)
GFR, Estimated: 26 mL/min — ABNORMAL LOW (ref >60)
Glucose, Bld: 109 mg/dL — ABNORMAL HIGH (ref 70–99)
Potassium: 3.4 mmol/L — ABNORMAL LOW (ref 3.5–5.1)
Sodium: 137 mmol/L (ref 135–145)
Total Bilirubin: 0.4 mg/dL (ref <1.2)
Total Protein: 7.7 g/dL (ref 6.5–8.1)

## 2022-12-30 LAB — CBC WITH DIFFERENTIAL/PLATELET
Abs Immature Granulocytes: 0.08 K/uL — ABNORMAL HIGH (ref 0.00–0.07)
Basophils Absolute: 0.1 K/uL (ref 0.0–0.1)
Basophils Relative: 1 %
Eosinophils Absolute: 0.2 K/uL (ref 0.0–0.5)
Eosinophils Relative: 3 %
HCT: 27.9 % — ABNORMAL LOW (ref 36.0–46.0)
Hemoglobin: 9 g/dL — ABNORMAL LOW (ref 12.0–15.0)
Immature Granulocytes: 1 %
Lymphocytes Relative: 14 %
Lymphs Abs: 0.9 K/uL (ref 0.7–4.0)
MCH: 33.6 pg (ref 26.0–34.0)
MCHC: 32.3 g/dL (ref 30.0–36.0)
MCV: 104.1 fL — ABNORMAL HIGH (ref 80.0–100.0)
Monocytes Absolute: 0.9 K/uL (ref 0.1–1.0)
Monocytes Relative: 14 %
Neutro Abs: 4.4 K/uL (ref 1.7–7.7)
Neutrophils Relative %: 67 %
Platelets: 427 K/uL — ABNORMAL HIGH (ref 150–400)
RBC: 2.68 MIL/uL — ABNORMAL LOW (ref 3.87–5.11)
RDW: 15.2 % (ref 11.5–15.5)
WBC: 6.6 K/uL (ref 4.0–10.5)
nRBC: 0 % (ref 0.0–0.2)

## 2022-12-30 LAB — URINALYSIS, W/ REFLEX TO CULTURE (INFECTION SUSPECTED)
Bacteria, UA: NONE SEEN
Bilirubin Urine: NEGATIVE
Glucose, UA: NEGATIVE mg/dL
Hgb urine dipstick: NEGATIVE
Ketones, ur: NEGATIVE mg/dL
Leukocytes,Ua: NEGATIVE
Nitrite: NEGATIVE
Protein, ur: NEGATIVE mg/dL
Specific Gravity, Urine: 1.006 (ref 1.005–1.030)
pH: 6 (ref 5.0–8.0)

## 2022-12-30 LAB — BRAIN NATRIURETIC PEPTIDE: B Natriuretic Peptide: 131.3 pg/mL — ABNORMAL HIGH (ref 0.0–100.0)

## 2022-12-30 LAB — MRSA NEXT GEN BY PCR, NASAL: MRSA by PCR Next Gen: NOT DETECTED

## 2022-12-30 LAB — I-STAT CG4 LACTIC ACID, ED: Lactic Acid, Venous: 0.9 mmol/L (ref 0.5–1.9)

## 2022-12-30 LAB — MAGNESIUM: Magnesium: 2 mg/dL (ref 1.7–2.4)

## 2022-12-30 MED ORDER — PANTOPRAZOLE SODIUM 40 MG PO TBEC
40.0000 mg | DELAYED_RELEASE_TABLET | Freq: Every day | ORAL | Status: DC
  Administered 2022-12-31 – 2023-01-03 (×4): 40 mg via ORAL
  Filled 2022-12-30 (×4): qty 1

## 2022-12-30 MED ORDER — LACTATED RINGERS IV SOLN: INTRAVENOUS | Status: AC

## 2022-12-30 MED ORDER — ACETAMINOPHEN 325 MG PO TABS: 650.0000 mg | ORAL_TABLET | Freq: Four times a day (QID) | ORAL | Status: DC | PRN

## 2022-12-30 MED ORDER — FUROSEMIDE 40 MG PO TABS
40.0000 mg | ORAL_TABLET | Freq: Every day | ORAL | Status: DC
  Administered 2022-12-31: 40 mg via ORAL
  Filled 2022-12-30: qty 1

## 2022-12-30 MED ORDER — SODIUM CHLORIDE 0.9% FLUSH
3.0000 mL | Freq: Two times a day (BID) | INTRAVENOUS | Status: DC
  Administered 2022-12-31 – 2023-01-03 (×6): 3 mL via INTRAVENOUS

## 2022-12-30 MED ORDER — ONDANSETRON HCL 4 MG PO TABS: 4.0000 mg | ORAL_TABLET | Freq: Four times a day (QID) | ORAL | Status: DC | PRN

## 2022-12-30 MED ORDER — ACETAMINOPHEN 650 MG RE SUPP: 650.0000 mg | Freq: Four times a day (QID) | RECTAL | Status: DC | PRN

## 2022-12-30 MED ORDER — ONDANSETRON HCL 4 MG/2ML IJ SOLN: 4.0000 mg | Freq: Four times a day (QID) | INTRAMUSCULAR | Status: DC | PRN

## 2022-12-30 MED ORDER — APIXABAN 5 MG PO TABS
5.0000 mg | ORAL_TABLET | Freq: Two times a day (BID) | ORAL | Status: DC
  Administered 2022-12-30 – 2022-12-31 (×3): 5 mg via ORAL
  Filled 2022-12-30 (×3): qty 1

## 2022-12-30 MED ORDER — TAMSULOSIN HCL 0.4 MG PO CAPS
0.4000 mg | ORAL_CAPSULE | Freq: Every day | ORAL | Status: DC
  Administered 2022-12-30 – 2023-01-02 (×4): 0.4 mg via ORAL
  Filled 2022-12-30 (×4): qty 1

## 2022-12-30 NOTE — H&P (Cosign Needed Addendum)
History and Physical    Patient: Stacey [name] BJY:[id number] DOB: 12/02/1940 DOA: 12/30/2022 DOS: the patient was seen and examined on 12/30/2022 PCP: [name], MD  Patient coming from: [facility] SNF Medical readiness/disposition: Anticipate will be able to discharge within the next 24 hours pending resolution of urinary retention patient will return to [facility] SNF.  Chief Complaint:  Chief Complaint  Patient presents with   Altered Mental Status    Per facility   HPI: Stacey [name] is a 82 y.o. female with medical history significant of chronic HFpEF, chronic hypoxemic respiratory failure, history of DVT on Eliquis, hypocalcemia/hypomagnesemia/hypokalemia during previous admission, CKD stage IIIb, chronic macrocytic anemia, and recurrent stage IV left breast cancer.  Patient was recently discharged from this facility on 11/26.  Discharge delayed by 24 hours due to urinary retention.  Patient had been admitted for acute exacerbation of heart failure [place] due to inadequate compliance with Lasix.  During previous admission patient had what she described as a TMJ episode thus the rationale for initiating Robaxin.  This medication was continued upon discharge.  Patient was sent back to the ED to SNF with complaints of altered mentation.  Upon EMS arrival to the facility patient apparently was hypoxemic and not wearing her oxygen.  O2 sats and mentation improved after application of oxygen.  With her history of urinary retention EDP opted to obtain a bladder scan which demonstrated urinary retention.  800 cc was obtained after I/O catheterization.  Patient was normotensive and afebrile upon presentation.  She was on 2 L of oxygen upon arrival with O2 sats 97% labs were unremarkable except for slightly low potassium of 3.4 a BNP of 131, hemoglobin of 9, UA unremarkable, lactic acid normal.  As a precaution blood cultures were obtained.  Twelve-lead EKG unremarkable.  Hospital service has been  asked to evaluate the patient for admission.  Upon my evaluation of the patient she was alert and oriented and moving all extremities x 4.  She was requesting to be placed back on the fourth floor due to her familiarity with the staff on that unit.  She stated she was hungry and would like to take a shower.  She reports that she feels like she needs to urinate again.  She wanted to make sure that the nursing facility was aware that she was being admitted and to be notified when she was ready to return.  Review of Systems: As mentioned in the history of present illness. All other systems reviewed and are negative. Past Medical History:  Diagnosis Date   ANEMIA-NOS    ANXIETY    Breast cancer (HCC) 1997 L, 2012 R   s/p chemo/xrt   COPD    resolved   DIVERTICULOSIS, COLON 2008   Dizziness    Family history of breast cancer    Family history of lung cancer    Family history of lymphoma    Family history of pancreatic cancer    Family history of uterine cancer    GERD    Hx of radiation therapy 10/19/11 -12/03/11   right breast   HYPERLIPIDEMIA    IRRITABLE BOWEL SYNDROME, HX OF    Left-sided carotid artery disease (HCC)    moderate left ICA stenosis   OSTEOARTHRITIS, HAND    PSVT (paroxysmal supraventricular tachycardia) (HCC)    symptomatic on event monitor   Past Surgical History:  Procedure Laterality Date   ABDOMINAL HYSTERECTOMY     APPENDECTOMY     BREAST  BIOPSY  11/14/10    r breast: inv, insitu mammary carcinoma w/calcif, er/pr +, her2 -   BREAST SURGERY     lumpectomy   CATARACT EXTRACTION     both eyes   ELECTROPHYSIOLOGIC STUDY N/A 05/10/2015   Procedure: SVT Ablation;  Surgeon: Will Jorja Loa, MD;  Location: [facility] INVASIVE CV LAB;  Service: Cardiovascular;  Laterality: N/A;   HERNIA REPAIR     inguinal herniorrhapy  1984   left   IR US GUIDE BX ASP/DRAIN  05/26/2019   rectal fissure repair     s/p benign breast biopsy  2003   right   s/p left foot surgury  2009    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   spiral fx left foot  2008   no surgury   TMJ ARTHROPLASTY  1989   TONGUE SURGERY     1988- to remove scar tissue growth    TONSILLECTOMY     Social History:  reports that she quit smoking about 6 years ago. Her smoking use included cigarettes. She started smoking about 61 years ago. She has a 54 pack-year smoking history. She has never used smokeless tobacco. She reports current alcohol use. She reports that she does not use drugs.  Allergies  Allergen Reactions   Other Nausea Only and Other (See Comments)    A lot of antibiotics cause extreme nausea   Tape Other (See Comments)    PAPER TAPE IS MUCH PREFERRED   Aminoglycosides Other (See Comments)    Unknown reaction - pt is not sure where this entry came from   Bacitracin Other (See Comments)    Reaction not recalled   Bee Venom Swelling and Other (See Comments)    Severe body swelling   Cephalexin Nausea Only   Clindamycin/Lincomycin Diarrhea and Nausea And Vomiting   Codeine Nausea And Vomiting and Other (See Comments)    Pt can take codeine cough syrup    Family History  Problem Relation Age of Onset   Hypertension Mother    Stroke Mother    Colon polyps Mother    Diabetes Mother    Pancreatic cancer Mother 78   Uterine cancer Mother 75   Lymphoma Brother        burkitts   Lung cancer Paternal Uncle    Lung cancer Maternal Grandmother 25       non-smoker   Lung cancer Maternal Grandfather    Breast cancer Cousin        maternal cousin, dx in her mid 49s   Brain cancer Cousin        maternal cousin's son; dx in his 21s   Testicular cancer Cousin        maternal cousin's son;    Breast cancer Cousin        paternal cousin; dx in her 32s   Breast cancer Cousin        paternal cousin's daughter; dx in 47s; neg genetic testing   Colon cancer Neg Hx    Esophageal cancer Neg Hx    Stomach cancer Neg Hx    Rectal cancer Neg Hx     Prior to Admission medications    Medication Sig Start Date End Date Taking? Authorizing Provider  acetaminophen (TYLENOL) 500 MG tablet Take 1,000 mg by mouth every 8 (eight) hours as needed for mild pain (pain score 1-3), headache or fever.    [provider]  aluminum-magnesium hydroxide 200-200 MG/5ML suspension Take 30  mLs by mouth every 6 (six) hours as needed for indigestion.    [provider]  anastrozole (ARIMIDEX) 1 MG tablet TAKE 1 TABLET(1 MG) BY MOUTH DAILY 12/23/22   Rachel Moulds, MD  apixaban (ELIQUIS) 5 MG TABS tablet Take 1 tablet (5 mg total) by mouth 2 (two) times daily. 12/16/22   Pokhrel, Rebekah Chesterfield, MD  aspirin 81 MG EC tablet Take 81 mg by mouth every morning.    [provider]  budesonide (PULMICORT) 0.25 MG/2ML nebulizer solution Take 2 mLs (0.25 mg total) by nebulization 2 (two) times daily for 5 days. 12/16/22 12/21/22  Pokhrel, Rebekah Chesterfield, MD  CALCIUM CARBONATE-VITAMIN D PO Take 1 tablet by mouth 2 (two) times daily.    [provider]  Cholecalciferol (VITAMIN D3 PO) Take 25 mcg by mouth every morning.    [provider]  diphenhydrAMINE (BENADRYL) 25 MG tablet Take 25 mg by mouth every 6 (six) hours as needed (for bee stings).     [provider]  fulvestrant (FASLODEX) 250 MG/5ML injection Inject 250 mg into the muscle every 28 (twenty-eight) days. 11/23/19   [provider]  furosemide (LASIX) 40 MG tablet Take 1 tablet (40 mg total) by mouth daily as needed for 3 days for fluid or edema (dyspnea, weight gain of 3 lbs in 24 hours), THEN 1 tablet (40 mg total) daily. 12/29/22 01/31/23  Narda Bonds, MD  loratadine (CLARITIN) 10 MG tablet Take 10 mg by mouth every morning.    [provider]  methocarbamol (ROBAXIN) 500 MG tablet Take 1 tablet (500 mg total) by mouth every 8 (eight) hours as needed for up to 7 days for muscle spasms. 12/28/22 01/04/23  Narda Bonds, MD  Multiple Vitamin (MULTIVITAMIN WITH MINERALS) TABS tablet Take  1 tablet by mouth every morning. Centrum    [provider]  omeprazole (PRILOSEC) 20 MG capsule Take 1 capsule by mouth daily Patient taking differently: Take 20 mg by mouth daily before breakfast. Take 1 capsule by mouth daily 09/14/22   [name], MD  palbociclib (IBRANCE) 100 MG tablet HOLD PER ONCOLOGIST. FOLLOW-UP WITH ONCOLOGIST PRIOR TO RESTARTING 12/28/22   Narda Bonds, MD  prochlorperazine (COMPAZINE) 10 MG tablet Take 1 tablet (10 mg total) by mouth every 6 (six) hours as needed for nausea or vomiting. 06/26/19   Magrinat, Valentino Hue, MD  triamcinolone (NASACORT) 55 MCG/ACT AERO nasal inhaler Place 2 sprays into the nose daily. 2 sprays each nostril at night before bedtime Patient taking differently: Place 2 sprays into the nose See admin instructions. Instill 2 sprays into each nostril every night before bedtime 10/14/20   Drema Halon, MD  vitamin B-12 (CYANOCOBALAMIN) 100 MCG tablet Take 100 mcg by mouth daily.    [provider]  zolpidem (AMBIEN) 10 MG tablet Take 1 tablet (10 mg total) by mouth at bedtime as needed for sleep. 12/28/22   Narda Bonds, MD    Physical Exam: Vitals:   12/30/22 1500 12/30/22 1530 12/30/22 1600 12/30/22 1635  BP: (!) 128/58 126/64 137/71 126/66  Pulse:  94 99 (!) 109  Resp: 18 17 17 18   Temp:    97.9 F (36.6 C)  TempSrc:    Oral  SpO2:  100% 97% 100%   Constitutional: NAD, calm, comfortable Respiratory: clear to auscultation bilaterally, no wheezing, no crackles. Normal respiratory effort. No accessory muscle use.  2 L oxygen Cardiovascular: Regular rate and rhythm, no murmurs / rubs / gallops.  No extremity edema. 2+ pedal pulses.  Abdomen: no tenderness, no masses palpated. No hepatosplenomegaly. Bowel sounds positive.  Genitourinary: Patient reports tenderness with palpation over suprapubic region Musculoskeletal: no clubbing / cyanosis. No joint deformity upper and lower extremities. Good ROM, no  contractures. Normal muscle tone.  Skin: no rashes, lesions, ulcers. No induration Neurologic: CN 2-12 grossly intact. Sensation intact, DTR normal. Strength 5/5 x all 4 extremities.  Psychiatric: Alert and oriented x 3. Normal mood.    Data Reviewed:  Sodium 137, potassium 3.4, glucose 109, BUN 19, creatinine 1.89, LFTs are normal  BNP 131.3  Lactic acid normal  White count 6600 normal differential, hemoglobin 9.0 with an MCV of 104.1, platelets 427,000  Urinalysis unremarkable  Blood cultures pending  Assessment and Plan: Acute altered mentation Suspect secondary to 2 issues: Inconsistent use of oxygen and unanticipated accumulation of Robaxin Discontinue Robaxan and provide IV fluid next 24 hours Also hold preadmission Ambien.  Recommend utilization of a different medication for an elderly patient. Currently patient is alert and oriented x 3 PT/OT evaluation  Acute urinary retention Patient had similar issue prior to last discharge On review of medication reconciliation Robaxin had been started previous admission and patient had received 4 doses to discharge-suspect this is etiology to urinary retention therefore Robaxin has been discontinued (patient says was being utilized to treat TMJ symptoms) Renal US Bladder scan every 4 hours - staff to call MD if retaining greater than 300 cc. If requires more than 2 in and out catheterizations a Foley catheter may need to be replaced and patient referred to urology in the outpatient setting  Chronic HFpEF Continue preadmission Lasix daily Strict I/O  History of DVT Continue Eliquis  History of breast cancer Hold Arimidex  Ibrance previously placed on hold by oncologist    Advance Care Planning:   Code Status: Full Code   DVT prophylaxis: Eliquis  Consults: None  Family Communication: Patient only  Severity of Illness: The appropriate patient status for this patient is OBSERVATION. Observation status is judged to be  reasonable and necessary in order to provide the required intensity of service to ensure the patient's safety. The patient's presenting symptoms, physical exam findings, and initial radiographic and laboratory data in the context of their medical condition is felt to place them at decreased risk for further clinical deterioration. Furthermore, it is anticipated that the patient will be medically stable for discharge from the hospital within 2 midnights of admission.   Author: Junious Silk, NP 12/30/2022 4:57 PM  For on call review [email or web address].

## 2022-12-30 NOTE — ED Notes (Signed)
ED TO INPATIENT HANDOFF REPORT  Name/Age/Gender Stacey [name] 82 y.o. female  Code Status Code Status History     Date Active Date Inactive Code Status Order ID Comments User Context   12/26/2022 0202 12/29/2022 1814 Full Code [id number]  Anselm Jungling, DO ED   12/04/2022 2113 12/16/2022 2102 Full Code [id number]  Nolberto Hanlon, MD ED   11/17/2022 2213 11/20/2022 2342 Full Code [id number]  Hillary Bow, DO ED    Questions for Most Recent Historical Code Status (Order [id number])     Question Answer   By: Consent: discussion documented in EHR            Home/SNF/Other Skilled nursing facility  Chief Complaint Altered mental status [R41.82]  Level of Care/Admitting Diagnosis ED Disposition     ED Disposition  Admit   Condition  --   Comment  Hospital Area: [facility] [[id number]]  Level of Care: Med-Surg [16]  May place patient in observation at [facility] or [facility] if equivalent level of care is available:: Yes  Covid Evaluation: Asymptomatic - no recent exposure (last 10 days) testing not required  Diagnosis: Altered mental status [780.97.ICD-9-CM]  Admitting Physician: Lanae Boast [[id number]]  Attending Physician: Lanae Boast [[id number]]          Medical History Past Medical History:  Diagnosis Date   ANEMIA-NOS    ANXIETY    Breast cancer (HCC) 1997 L, 2012 R   s/p chemo/xrt   COPD    resolved   DIVERTICULOSIS, COLON 2008   Dizziness    Family history of breast cancer    Family history of lung cancer    Family history of lymphoma    Family history of pancreatic cancer    Family history of uterine cancer    GERD    Hx of radiation therapy 10/19/11 -12/03/11   right breast   HYPERLIPIDEMIA    IRRITABLE BOWEL SYNDROME, HX OF    Left-sided carotid artery disease (HCC)    moderate left ICA stenosis   OSTEOARTHRITIS, HAND    PSVT (paroxysmal supraventricular tachycardia) (HCC)    symptomatic on event monitor    Allergies Allergies   Allergen Reactions   Other Nausea Only and Other (See Comments)    A lot of antibiotics cause extreme nausea   Tape Other (See Comments)    PAPER TAPE IS MUCH PREFERRED   Aminoglycosides Other (See Comments)    Unknown reaction - pt is not sure where this entry came from   Bacitracin Other (See Comments)    Reaction not recalled   Bee Venom Swelling and Other (See Comments)    Severe body swelling   Cephalexin Nausea Only   Clindamycin/Lincomycin Diarrhea and Nausea And Vomiting   Codeine Nausea And Vomiting and Other (See Comments)    Pt can take codeine cough syrup    IV Location/Drains/Wounds Patient Lines/Drains/Airways Status     Active Line/Drains/Airways     Name Placement date Placement time Site Days   Peripheral IV 12/30/22 20 G Anterior;Proximal;Right Forearm 12/30/22  1115  Forearm  less than 1            Labs/Imaging Results for orders placed or performed during the hospital encounter of 12/30/22 (from the past 48 hour(s))  Brain natriuretic peptide     Status: Abnormal   Collection Time: 12/30/22 10:15 AM  Result Value Ref Range   B Natriuretic Peptide 131.3 (H) 0.0 - 100.0 pg/mL    Comment:  Performed at [facility], [address], [place], [place] [address]  Comprehensive metabolic panel     Status: Abnormal   Collection Time: 12/30/22 10:15 AM  Result Value Ref Range   Sodium 137 135 - 145 mmol/L   Potassium 3.4 (L) 3.5 - 5.1 mmol/L   Chloride 98 98 - 111 mmol/L   CO2 28 22 - 32 mmol/L   Glucose, Bld 109 (H) 70 - 99 mg/dL    Comment: Glucose reference range applies only to samples taken after fasting for at least 8 hours.   BUN 19 8 - 23 mg/dL   Creatinine, Ser 6.04 (H) 0.44 - 1.00 mg/dL   Calcium 9.1 8.9 - 54.0 mg/dL   Total Protein 7.7 6.5 - 8.1 g/dL   Albumin 3.4 (L) 3.5 - 5.0 g/dL   AST 20 15 - 41 U/L   ALT 13 0 - 44 U/L   Alkaline Phosphatase 69 38 - 126 U/L   Total Bilirubin 0.4 <1.2 mg/dL   GFR, Estimated 26 (L) >60  mL/min    Comment: (NOTE) Calculated using the CKD-EPI Creatinine Equation (2021)    Anion gap 11 5 - 15    Comment: Performed at [facility], [address], [place], [place] [address]  CBC with Differential/Platelet     Status: Abnormal   Collection Time: 12/30/22 10:15 AM  Result Value Ref Range   WBC 6.6 4.0 - 10.5 K/uL   RBC 2.68 (L) 3.87 - 5.11 MIL/uL   Hemoglobin 9.0 (L) 12.0 - 15.0 g/dL   HCT 14.7 (L) 82.9 - 56.2 %   MCV 104.1 (H) 80.0 - 100.0 fL   MCH 33.6 26.0 - 34.0 pg   MCHC 32.3 30.0 - 36.0 g/dL   RDW 13.0 86.5 - 78.4 %   Platelets 427 (H) 150 - 400 K/uL   nRBC 0.0 0.0 - 0.2 %   Neutrophils Relative % 67 %   Neutro Abs 4.4 1.7 - 7.7 K/uL   Lymphocytes Relative 14 %   Lymphs Abs 0.9 0.7 - 4.0 K/uL   Monocytes Relative 14 %   Monocytes Absolute 0.9 0.1 - 1.0 K/uL   Eosinophils Relative 3 %   Eosinophils Absolute 0.2 0.0 - 0.5 K/uL   Basophils Relative 1 %   Basophils Absolute 0.1 0.0 - 0.1 K/uL   Immature Granulocytes 1 %   Abs Immature Granulocytes 0.08 (H) 0.00 - 0.07 K/uL    Comment: Performed at [facility], [address] [place], [place] [address]  Magnesium     Status: None   Collection Time: 12/30/22 10:15 AM  Result Value Ref Range   Magnesium 2.0 1.7 - 2.4 mg/dL    Comment: Performed at [facility], [address] [place], [place] [address]  I-Stat CG4 Lactic Acid     Status: None   Collection Time: 12/30/22 11:25 AM  Result Value Ref Range   Lactic Acid, Venous 0.9 0.5 - 1.9 mmol/L  Urinalysis, w/ Reflex to Culture (Infection Suspected) -Urine, Clean Catch     Status: Abnormal   Collection Time: 12/30/22 12:48 PM  Result Value Ref Range   Specimen Source URINE, CLEAN CATCH    Color, Urine STRAW (A) YELLOW   APPearance CLEAR CLEAR   Specific Gravity, Urine 1.006 1.005 - 1.030   pH 6.0 5.0 - 8.0   Glucose, UA NEGATIVE NEGATIVE mg/dL   Hgb urine dipstick NEGATIVE NEGATIVE   Bilirubin  Urine NEGATIVE  NEGATIVE   Ketones, ur NEGATIVE NEGATIVE mg/dL   Protein, ur NEGATIVE NEGATIVE mg/dL   Nitrite NEGATIVE NEGATIVE   Leukocytes,Ua NEGATIVE NEGATIVE   RBC / HPF 0-5 0 - 5 RBC/hpf   WBC, UA 0-5 0 - 5 WBC/hpf    Comment:        Reflex urine culture not performed if WBC <=10, OR if Squamous epithelial cells >5. If Squamous epithelial cells >5 suggest recollection.    Bacteria, UA NONE SEEN NONE SEEN   Squamous Epithelial / HPF 0-5 0 - 5 /HPF   Mucus PRESENT     Comment: Performed at [facility], [address], [place], [place] [address]   *Note: Due to a large number of results and/or encounters for the requested time period, some results have not been displayed. A complete set of results can be found in Results Review.   DG Chest Port 1 View  Result Date: 12/30/2022 CLINICAL DATA:  Fatigue. EXAM: PORTABLE CHEST 1 VIEW COMPARISON:  Chest x-ray dated December 27, 2022. FINDINGS: Stable cardiomediastinal silhouette with mild cardiomegaly and moderate hiatal hernia. Resolved interstitial thickening at the lung bases. Resolved trace pleural effusions. No consolidation or pneumothorax. No acute osseous abnormality. IMPRESSION: 1. No active disease. Resolved pulmonary edema and trace pleural effusions. Electronically Signed   By: Obie Dredge M.D.   On: 12/30/2022 10:41    Pending Labs Unresulted Labs (From admission, onward)     Start     Ordered   12/30/22 1052  Culture, blood (Routine X 2) w Reflex to ID Panel  BLOOD CULTURE X 2,   R (with STAT occurrences)     Question:  Patient immune status  Answer:  Normal   12/30/22 1051            Vitals/Pain Today's Vitals   12/30/22 1403 12/30/22 1430 12/30/22 1500 12/30/22 1530  BP:  (!) 132/58 (!) 128/58   Pulse:  79  94  Resp:  18 18 17   Temp: 97.9 F (36.6 C)     TempSrc: Oral     SpO2:  100%  100%    Isolation Precautions No active isolations  Medications Medications  lactated ringers  infusion ( Intravenous New Bag/Given 12/30/22 1530)    Mobility walks with device

## 2022-12-30 NOTE — Hospital Course (Addendum)
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  Again noted to be with urine retention and straight cath yielded 800 cc of urine.  Admission was requested for worsening of her renal function and weakness.  Her mental status was normal on admission. Patient has been voiding well not needing Foley catheter-placed on Flomax Mentating well alert awake oriented x 3 creatinine improving on IV fluids,, seen by PT OT and planning for discharge back to skilled nursing facility encourage oral hydration.  At this time creatinine has improved to 1.4 and stable to return back to facility

## 2022-12-30 NOTE — ED Triage Notes (Signed)
Pt from snf, per ems called d/t ams, upon arrival to ED pt is AOX4 and having no complaints other than being tired. Up to bsc on triage, pt claims to suppose to be on O2 24/7 since late October,   Per tech 91% on RA prior to up to [facility]

## 2022-12-30 NOTE — Plan of Care (Signed)
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  Again noted to be with urine retention and straight cath yielded 800 cc of urine.  Admission was requested for worsening of her renal function and weakness.   On exam: Currently aaaox3, on 2l [place]. None at baseline. Some leg swelling from her dvt, no CP/SOB/abdomen pain.is non focal on exam Reports had bladder issues win last hospital stay. At baseline lives home alone prior to going to SNF  A/P AKI on CKD stage IIIb b/l creat Around 1.2-1.5 Urine retention-Foley placed in ED: Keep on gentle IV fluid hydration avoid nephrotoxic medications NSAIDs monitor output, likely need to go on Foley catheter if fails voiding trial, start Flomax.  Weakness/confusion at the facility: Hold psychotropic medication including Robaxin.  Currently oriented and pleasant. Other issues see HPI per Revonda Standard NP.

## 2022-12-30 NOTE — ED Provider Notes (Signed)
[place] EMERGENCY DEPARTMENT AT [facility] Provider Note   CSN: [id number] Arrival date & time: 12/30/22  8295     History  Chief Complaint  Patient presents with   Altered Mental Status    Per facility    Stacey [name] is a 82 y.o. female.  82 year old female presents with altered mental status.  Patiently recently discharged from the hospital for CHF exacerbation.  Old chart was reviewed.  States that she is on as needed oxygen at 2 L.  Notes increasing weakness.  Denies any chest pain or chest pressure.  Some cough.  No urinary symptoms.  No vomiting or diarrhea.  Was sent from the facility where she lives for further evaluation.       Home Medications Prior to Admission medications   Medication Sig Start Date End Date Taking? Authorizing Provider  acetaminophen (TYLENOL) 500 MG tablet Take 1,000 mg by mouth every 8 (eight) hours as needed for mild pain (pain score 1-3), headache or fever.    [provider]  aluminum-magnesium hydroxide 200-200 MG/5ML suspension Take 30 mLs by mouth every 6 (six) hours as needed for indigestion.    [provider]  anastrozole (ARIMIDEX) 1 MG tablet TAKE 1 TABLET(1 MG) BY MOUTH DAILY 12/23/22   Rachel Moulds, MD  apixaban (ELIQUIS) 5 MG TABS tablet Take 1 tablet (5 mg total) by mouth 2 (two) times daily. 12/16/22   Pokhrel, Rebekah Chesterfield, MD  aspirin 81 MG EC tablet Take 81 mg by mouth every morning.    [provider]  budesonide (PULMICORT) 0.25 MG/2ML nebulizer solution Take 2 mLs (0.25 mg total) by nebulization 2 (two) times daily for 5 days. 12/16/22 12/21/22  Pokhrel, Rebekah Chesterfield, MD  CALCIUM CARBONATE-VITAMIN D PO Take 1 tablet by mouth 2 (two) times daily.    [provider]  Cholecalciferol (VITAMIN D3 PO) Take 25 mcg by mouth every morning.    [provider]  diphenhydrAMINE (BENADRYL) 25 MG tablet Take 25 mg by mouth every 6 (six) hours as needed (for bee stings).     [provider]  fulvestrant (FASLODEX) 250 MG/5ML injection Inject 250 mg into the muscle every 28 (twenty-eight) days. 11/23/19   [provider]  furosemide (LASIX) 40 MG tablet Take 1 tablet (40 mg total) by mouth daily as needed for 3 days for fluid or edema (dyspnea, weight gain of 3 lbs in 24 hours), THEN 1 tablet (40 mg total) daily. 12/29/22 01/31/23  Narda Bonds, MD  loratadine (CLARITIN) 10 MG tablet Take 10 mg by mouth every morning.    [provider]  methocarbamol (ROBAXIN) 500 MG tablet Take 1 tablet (500 mg total) by mouth every 8 (eight) hours as needed for up to 7 days for muscle spasms. 12/28/22 01/04/23  Narda Bonds, MD  Multiple Vitamin (MULTIVITAMIN WITH MINERALS) TABS tablet Take 1 tablet by mouth every morning. Centrum    [provider]  omeprazole (PRILOSEC) 20 MG capsule Take 1 capsule by mouth daily Patient taking differently: Take 20 mg by mouth daily before breakfast. Take 1 capsule by mouth daily 09/14/22   [name], MD  palbociclib (IBRANCE) 100 MG tablet HOLD PER ONCOLOGIST. FOLLOW-UP WITH ONCOLOGIST PRIOR TO RESTARTING 12/28/22   Narda Bonds, MD  prochlorperazine (COMPAZINE) 10 MG tablet Take 1 tablet (10 mg total) by mouth every 6 (six) hours as needed for nausea or vomiting. 06/26/19   Magrinat, Valentino Hue, MD  triamcinolone (NASACORT) 55  MCG/ACT AERO nasal inhaler Place 2 sprays into the nose daily. 2 sprays each nostril at night before bedtime Patient taking differently: Place 2 sprays into the nose See admin instructions. Instill 2 sprays into each nostril every night before bedtime 10/14/20   Drema Halon, MD  vitamin B-12 (CYANOCOBALAMIN) 100 MCG tablet Take 100 mcg by mouth daily.    [provider]  zolpidem (AMBIEN) 10 MG tablet Take 1 tablet (10 mg total) by mouth at bedtime as needed for sleep. 12/28/22   Narda Bonds, MD      Allergies    Other, Tape, Aminoglycosides, Bacitracin, Bee venom,  Cephalexin, Clindamycin/lincomycin, and Codeine    Review of Systems   Review of Systems  All other systems reviewed and are negative.   Physical Exam Updated Vital Signs There were no vitals taken for this visit. Physical Exam Vitals and nursing note reviewed.  Constitutional:      General: She is not in acute distress.    Appearance: Normal appearance. She is well-developed. She is not toxic-appearing.  HENT:     Head: Normocephalic and atraumatic.  Eyes:     General: Lids are normal.     Conjunctiva/sclera: Conjunctivae normal.     Pupils: Pupils are equal, round, and reactive to light.  Neck:     Thyroid: No thyroid mass.     Trachea: No tracheal deviation.  Cardiovascular:     Rate and Rhythm: Normal rate and regular rhythm.     Heart sounds: Normal heart sounds. No murmur heard.    No gallop.  Pulmonary:     Effort: Tachypnea present. No respiratory distress.     Breath sounds: No stridor. Examination of the right-upper field reveals decreased breath sounds. Examination of the left-upper field reveals decreased breath sounds. Decreased breath sounds present. No wheezing, rhonchi or rales.  Abdominal:     General: There is no distension.     Palpations: Abdomen is soft.     Tenderness: There is no abdominal tenderness. There is no rebound.  Musculoskeletal:        General: No tenderness. Normal range of motion.     Cervical back: Normal range of motion and neck supple.  Skin:    General: Skin is warm and dry.     Findings: No abrasion or rash.  Neurological:     Mental Status: She is alert and oriented to person, place, and time. Mental status is at baseline.     GCS: GCS eye subscore is 4. GCS verbal subscore is 5. GCS motor subscore is 6.     Cranial Nerves: Cranial nerves are intact. No cranial nerve deficit.     Sensory: No sensory deficit.     Motor: Motor function is intact.  Psychiatric:        Attention and Perception: Attention normal.        Speech:  Speech normal.        Behavior: Behavior normal.     ED Results / Procedures / Treatments   Labs (all labs ordered are listed, but only abnormal results are displayed) Labs Reviewed  BRAIN NATRIURETIC PEPTIDE  COMPREHENSIVE METABOLIC PANEL  CBC WITH DIFFERENTIAL/PLATELET  URINALYSIS, W/ REFLEX TO CULTURE (INFECTION SUSPECTED)  MAGNESIUM    EKG EKG Interpretation Date/Time:  Wednesday December 30 2022 10:15:00 EST Ventricular Rate:  95 PR Interval:  152 QRS Duration:  106 QT Interval:  362 QTC Calculation: 456 R Axis:   -53  Text Interpretation: Sinus rhythm  LAD, consider left anterior fascicular block Borderline repolarization abnormality ST elevation, consider inferior injury Confirmed by Lorre Nick ([id number]) on 12/30/2022 10:19:36 AM  Radiology CT MAXILLOFACIAL WO CONTRAST  Result Date: 12/28/2022 CLINICAL DATA:  Right-sided jaw pain and swelling EXAM: CT MAXILLOFACIAL WITHOUT CONTRAST TECHNIQUE: Multidetector CT imaging of the maxillofacial structures was performed. Multiplanar CT image reconstructions were also generated. RADIATION DOSE REDUCTION: This exam was performed according to the departmental dose-optimization program which includes automated exposure control, adjustment of the mA and/or kV according to patient size and/or use of iterative reconstruction technique. COMPARISON:  12/03/2020 FINDINGS: Osseous: No acute maxillofacial bone fracture. Bony orbital walls are intact. Mandible intact. Temporomandibular joints are aligned without dislocation. Mild degenerative changes of the temporomandibular joints. No erosions. Orbits: Negative. No traumatic or inflammatory finding. Sinuses: Paranasal sinuses and mastoid air cells are clear. Soft tissues: No soft tissues swelling or fluid collections. Limited intracranial: No significant or unexpected finding. IMPRESSION: 1. No acute maxillofacial bone fracture. 2. Mild degenerative changes of the temporomandibular joints.  Electronically Signed   By: Duanne Guess D.O.   On: 12/28/2022 15:10    Procedures Procedures    Medications Ordered in ED Medications - No data to display  ED Course/ Medical Decision Making/ A&P                                 Medical Decision Making Amount and/or Complexity of Data Reviewed Labs: ordered. Radiology: ordered.   Patient is EKG per my interpretation shows no acute ischemic changes.  Normal sinus rhythm.  Chest x-ray is negative for infiltrate at this time.  Patient initially hypoxic and placed on oxygen.  Patient states that she uses oxygen as needed.  Notes diffuse weakness at this time.  Urinalysis negative.  Lactic acid normal.  Patient had complained of some suprapubic pressure.  Did have episode of urinary retention prior to being discharged from the hospital yesterday.  Patient was straight cathed here in 800 cc of urine was obtained.  Patient's labs significant for worsening kidney injury.  This is likely from post obstruction.  Due to patient's continued weakness will admit to the hospitalist team        Final Clinical Impression(s) / ED Diagnoses Final diagnoses:  None    Rx / DC Orders ED Discharge Orders     None         Lorre Nick, MD 12/30/22 1439

## 2022-12-31 DIAGNOSIS — C7951 Secondary malignant neoplasm of bone: Secondary | ICD-10-CM | POA: Diagnosis present

## 2022-12-31 DIAGNOSIS — I5033 Acute on chronic diastolic (congestive) heart failure: Secondary | ICD-10-CM | POA: Diagnosis not present

## 2022-12-31 DIAGNOSIS — Z7401 Bed confinement status: Secondary | ICD-10-CM | POA: Diagnosis not present

## 2022-12-31 DIAGNOSIS — Z881 Allergy status to other antibiotic agents status: Secondary | ICD-10-CM | POA: Diagnosis not present

## 2022-12-31 DIAGNOSIS — R2689 Other abnormalities of gait and mobility: Secondary | ICD-10-CM | POA: Diagnosis not present

## 2022-12-31 DIAGNOSIS — R339 Retention of urine, unspecified: Secondary | ICD-10-CM | POA: Diagnosis present

## 2022-12-31 DIAGNOSIS — C50911 Malignant neoplasm of unspecified site of right female breast: Secondary | ICD-10-CM | POA: Diagnosis not present

## 2022-12-31 DIAGNOSIS — Z8249 Family history of ischemic heart disease and other diseases of the circulatory system: Secondary | ICD-10-CM | POA: Diagnosis not present

## 2022-12-31 DIAGNOSIS — R4182 Altered mental status, unspecified: Secondary | ICD-10-CM | POA: Insufficient documentation

## 2022-12-31 DIAGNOSIS — J9601 Acute respiratory failure with hypoxia: Secondary | ICD-10-CM | POA: Diagnosis not present

## 2022-12-31 DIAGNOSIS — R278 Other lack of coordination: Secondary | ICD-10-CM | POA: Diagnosis not present

## 2022-12-31 DIAGNOSIS — Z800 Family history of malignant neoplasm of digestive organs: Secondary | ICD-10-CM | POA: Diagnosis not present

## 2022-12-31 DIAGNOSIS — Z87891 Personal history of nicotine dependence: Secondary | ICD-10-CM | POA: Diagnosis not present

## 2022-12-31 DIAGNOSIS — Z7982 Long term (current) use of aspirin: Secondary | ICD-10-CM | POA: Diagnosis not present

## 2022-12-31 DIAGNOSIS — E785 Hyperlipidemia, unspecified: Secondary | ICD-10-CM | POA: Diagnosis present

## 2022-12-31 DIAGNOSIS — Z888 Allergy status to other drugs, medicaments and biological substances status: Secondary | ICD-10-CM | POA: Diagnosis not present

## 2022-12-31 DIAGNOSIS — Z79811 Long term (current) use of aromatase inhibitors: Secondary | ICD-10-CM | POA: Diagnosis not present

## 2022-12-31 DIAGNOSIS — Z83719 Family history of colon polyps, unspecified: Secondary | ICD-10-CM | POA: Diagnosis not present

## 2022-12-31 DIAGNOSIS — N1832 Chronic kidney disease, stage 3b: Secondary | ICD-10-CM | POA: Diagnosis present

## 2022-12-31 DIAGNOSIS — D649 Anemia, unspecified: Secondary | ICD-10-CM | POA: Diagnosis not present

## 2022-12-31 DIAGNOSIS — Z91030 Bee allergy status: Secondary | ICD-10-CM | POA: Diagnosis not present

## 2022-12-31 DIAGNOSIS — J9611 Chronic respiratory failure with hypoxia: Secondary | ICD-10-CM | POA: Diagnosis present

## 2022-12-31 DIAGNOSIS — R531 Weakness: Secondary | ICD-10-CM | POA: Diagnosis not present

## 2022-12-31 DIAGNOSIS — Z853 Personal history of malignant neoplasm of breast: Secondary | ICD-10-CM | POA: Diagnosis not present

## 2022-12-31 DIAGNOSIS — M6281 Muscle weakness (generalized): Secondary | ICD-10-CM | POA: Diagnosis not present

## 2022-12-31 DIAGNOSIS — Z923 Personal history of irradiation: Secondary | ICD-10-CM | POA: Diagnosis not present

## 2022-12-31 DIAGNOSIS — Z9981 Dependence on supplemental oxygen: Secondary | ICD-10-CM | POA: Diagnosis not present

## 2022-12-31 DIAGNOSIS — E8721 Acute metabolic acidosis: Secondary | ICD-10-CM | POA: Diagnosis not present

## 2022-12-31 DIAGNOSIS — Z823 Family history of stroke: Secondary | ICD-10-CM | POA: Diagnosis not present

## 2022-12-31 DIAGNOSIS — Z86718 Personal history of other venous thrombosis and embolism: Secondary | ICD-10-CM | POA: Diagnosis not present

## 2022-12-31 DIAGNOSIS — Z7951 Long term (current) use of inhaled steroids: Secondary | ICD-10-CM | POA: Diagnosis not present

## 2022-12-31 DIAGNOSIS — Z885 Allergy status to narcotic agent status: Secondary | ICD-10-CM | POA: Diagnosis not present

## 2022-12-31 DIAGNOSIS — I10 Essential (primary) hypertension: Secondary | ICD-10-CM | POA: Diagnosis not present

## 2022-12-31 DIAGNOSIS — Z7901 Long term (current) use of anticoagulants: Secondary | ICD-10-CM | POA: Diagnosis not present

## 2022-12-31 DIAGNOSIS — I5032 Chronic diastolic (congestive) heart failure: Secondary | ICD-10-CM | POA: Diagnosis present

## 2022-12-31 DIAGNOSIS — K219 Gastro-esophageal reflux disease without esophagitis: Secondary | ICD-10-CM | POA: Diagnosis not present

## 2022-12-31 DIAGNOSIS — N179 Acute kidney failure, unspecified: Secondary | ICD-10-CM | POA: Diagnosis present

## 2022-12-31 LAB — BASIC METABOLIC PANEL
Anion gap: 10 (ref 5–15)
BUN: 21 mg/dL (ref 8–23)
CO2: 27 mmol/L (ref 22–32)
Calcium: 8.8 mg/dL — ABNORMAL LOW (ref 8.9–10.3)
Chloride: 96 mmol/L — ABNORMAL LOW (ref 98–111)
Creatinine, Ser: 1.6 mg/dL — ABNORMAL HIGH (ref 0.44–1.00)
GFR, Estimated: 32 mL/min — ABNORMAL LOW (ref >60)
Glucose, Bld: 112 mg/dL — ABNORMAL HIGH (ref 70–99)
Potassium: 3.9 mmol/L (ref 3.5–5.1)
Sodium: 133 mmol/L — ABNORMAL LOW (ref 135–145)

## 2022-12-31 MED ORDER — LACTATED RINGERS IV SOLN: INTRAVENOUS | Status: AC

## 2022-12-31 NOTE — Evaluation (Signed)
Physical Therapy Evaluation Patient Details Name: Stacey [name] MRN: [id number] DOB: 03/09/1940 Today's Date: 12/31/2022  History of Present Illness  Pt is a 82 y/o female presenting on 11/28 with AMS from [facility] SNF. Noted most recent hospital admission for CHF exacerbation, but several recent admissions.  Pt with hx including but not limited to breast CA with bone mets on chronic immunotherapy, L UE lymphedema, OA, DVT, chronic diarrhea, anemia.  Clinical Impression  Pt admitted as above and presenting with functional mobility limitations 2* generalized weakness, decreased activity tolerance and generalized weakness.  Pt admitted from [facility] rehab and would benefit from return to same to maximize IND and safety prior to return home with limited assist.      If plan is discharge home, recommend the following: Assistance with cooking/housework;Help with stairs or ramp for entrance;A little help with walking and/or transfers;A little help with bathing/dressing/bathroom   Can travel by private vehicle   No    Equipment Recommendations None recommended by PT  Recommendations for Other Services       Functional Status Assessment Patient has had a recent decline in their functional status and demonstrates the ability to make significant improvements in function in a reasonable and predictable amount of time.     Precautions / Restrictions Precautions Precautions: Fall Precaution Comments: watch O2 Restrictions Weight Bearing Restrictions: No      Mobility  Bed Mobility Overal bed mobility: Needs Assistance Bed Mobility: Supine to Sit     Supine to sit: Supervision     General bed mobility comments: No physical assist    Transfers Overall transfer level: Needs assistance Equipment used: Rolling walker (2 wheels) Transfers: Sit to/from Stand Sit to Stand: Min assist           General transfer comment: Steady assist with min cues for use of UEs to self assist     Ambulation/Gait Ambulation/Gait assistance: Min assist, Contact guard assist Gait Distance (Feet): 125 Feet Assistive device: Rolling walker (2 wheels) Gait Pattern/deviations: Decreased stride length, Step-through pattern       General Gait Details: cues for posture, position from RW and to decrease pace for safety  Stairs            Wheelchair Mobility     Tilt Bed    Modified Rankin (Stroke Patients Only)       Balance Overall balance assessment: Needs assistance Sitting-balance support: No upper extremity supported, Feet supported Sitting balance-Leahy Scale: Good     Standing balance support: Bilateral upper extremity supported, Single extremity supported, During functional activity Standing balance-Leahy Scale: Poor Standing balance comment: static fair, dynamic poor (reliant on UE support)                             Pertinent Vitals/Pain Pain Assessment Pain Assessment: Faces Faces Pain Scale: No hurt Pain Intervention(s): Limited activity within patient's tolerance, Monitored during session    Home Living Family/patient expects to be discharged to:: Skilled nursing facility Living Arrangements: Alone Available Help at Discharge: Family;Available PRN/intermittently Type of Home: House Home Access: Stairs to enter   Entrance Stairs-Number of Steps: 1   Home Layout: One level Home Equipment: BSC/3in1;Shower seat;Rolling Environmental consultant (2 wheels) Additional Comments: Pt has been in [facility] but back and forth to hospital    Prior Function Prior Level of Function : Needs assist             Mobility Comments: using RW,  independent prior to hopistalizations starting in Oct ADLs Comments: some assist with ADLs as needed, was independnet prior to admission in oct     Extremity/Trunk Assessment   Upper Extremity Assessment Upper Extremity Assessment: Defer to OT evaluation RUE Deficits / Details: WFL, chronic arthritic change in  fingers RUE Coordination: decreased fine motor LUE Deficits / Details: Chronic edema due to reported lymphedema. Chronic shoulder AROM limitations. Functional grip strength.  Currently wearing arm sleeve LUE Coordination: decreased fine motor    Lower Extremity Assessment Lower Extremity Assessment: Generalized weakness    Cervical / Trunk Assessment Cervical / Trunk Assessment: Normal  Communication   Communication Communication: No apparent difficulties  Cognition Arousal: Alert Behavior During Therapy: WFL for tasks assessed/performed Overall Cognitive Status: Within Functional Limits for tasks assessed                                 General Comments: oriented and appropraite throughout session        General Comments General comments (skin integrity, edema, etc.): On 3L O2 in room    Exercises     Assessment/Plan    PT Assessment Patient needs continued PT services  PT Problem List Decreased strength;Cardiopulmonary status limiting activity;Decreased range of motion;Decreased activity tolerance;Decreased balance;Decreased knowledge of use of DME;Decreased safety awareness;Decreased mobility       PT Treatment Interventions DME instruction;Therapeutic exercise;Gait training;Balance training;Neuromuscular re-education;Functional mobility training;Therapeutic activities;Patient/family education    PT Goals (Current goals can be found in the Care Plan section)  Acute Rehab PT Goals Patient Stated Goal: return home PT Goal Formulation: With patient Time For Goal Achievement: 01/14/23 Potential to Achieve Goals: Good    Frequency Min 1X/week     Co-evaluation PT/OT/SLP Co-Evaluation/Treatment: Yes Reason for Co-Treatment: To address functional/ADL transfers PT goals addressed during session: Mobility/safety with mobility OT goals addressed during session: ADL's and self-care       AM-PAC PT "6 Clicks" Mobility  Outcome Measure Help needed  turning from your back to your side while in a flat bed without using bedrails?: A Little Help needed moving from lying on your back to sitting on the side of a flat bed without using bedrails?: A Little Help needed moving to and from a bed to a chair (including a wheelchair)?: A Little Help needed standing up from a chair using your arms (e.g., wheelchair or bedside chair)?: A Little Help needed to walk in hospital room?: A Little Help needed climbing 3-5 steps with a railing? : A Lot 6 Click Score: 17    End of Session Equipment Utilized During Treatment: Gait belt;Oxygen Activity Tolerance: Patient tolerated treatment well Patient left: in chair;with call bell/phone within reach Nurse Communication: Mobility status PT Visit Diagnosis: Other abnormalities of gait and mobility (R26.89);Muscle weakness (generalized) (M62.81)    Time: 1610-9604 PT Time Calculation (min) (ACUTE ONLY): 23 min   Charges:   PT Evaluation $PT Eval Low Complexity: 1 Low   PT General Charges $$ ACUTE PT VISIT: 1 Visit         Mauro Kaufmann PT Acute Rehabilitation Services Pager [phone] Office [phone]   Stacey [name] 12/31/2022, 4:32 PM

## 2022-12-31 NOTE — Progress Notes (Signed)
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  Again noted to be with urine retention and straight cath yielded 800 cc of urine.  Admission was requested for worsening of her renal function and weakness.  Her mental status was normal on admission  Patient has been voiding well not needing Foley catheter-placed on Flomax Mentating well alert awake oriented x 3 creatinine improving on IV fluids PT OT TOC consult for discharge back to facility once creatinine stabilizes     Subjective: Seen and examined this morning, voiding well with no urine retention Alert awake no new complaints   Assessment and Plan: Principal Problem:   Altered mental status  AKI on CKD stage IIIb b/l creat Around 1.2-1.5: Creatinine improving on IV fluids continue the same.  Encourage oral hydration Recent Labs    12/13/22 0415 12/14/22 0424 12/15/22 0412 12/16/22 0408 12/25/22 2137 12/26/22 0456 12/27/22 0425 12/28/22 0345 12/29/22 0828 12/30/22 1015 12/31/22 0606  BUN 22 22 21 18 19 17 15   --  18 19 21   CREATININE 2.12* 2.41* 1.86* 1.52* 1.24* 1.32* 1.38*  --  1.74* 1.89* 1.60*  CO2 27 26 24  21* 20* 21* 22  --  23 28 27   K 3.9 3.7 3.3* 3.4* 2.8* 3.4* 3.0* 4.2 3.7 3.4* 3.9    Urine retention: -I/O Foley placed in ED: Normal voiding well continue on Flomax  avoid Robaxin.  Generalized weakness/confusion at the facility: ?  Etiology workup unremarkable so far.  She is alert awake oriented since admission. Robaxin discontinued all psychotropic medication pain medication continue PT OT evaluation likely needs to return to facility for SNF  History of DVT: Continue Eliquis  History of breast cancer: Resume Arimidex on discharge. HER Ibrance previously placed on hold by oncologist   Chronic HFpEF: Stable.home meds lasix on.  If creatinine does not improve will need to cut back the dose  DVT prophylaxis:  Code Status:   Code Status: Full Code Family Communication: plan of care discussed with patient at bedside. Patient status is:  admitted as observation but remains hospitalized for ongoing  because of  AKI. Level of care: Med-Surg   Dispo: The patient is from: SNF            Anticipated disposition: Likely SNF  Objective: Vitals last 24 hrs: Vitals:   12/30/22 1749 12/30/22 1831 12/30/22 2026 12/31/22 0351  BP:   (!) 118/59 128/60  Pulse:   (!) 107 (!) 110  Resp:   20 18  Temp:   98.6 F (37 C) 98.4 F (36.9 C)  TempSrc:   Oral Oral  SpO2:   100% 100%  Weight:  59.2 kg    Height: 5\' 3"  (1.6 m)      Weight change:   Physical Examination: General exam: alert awake, older than stated age HEENT:Oral mucosa moist, Ear/Nose WNL grossly Respiratory system: bilaterally CLEAR BS, no use of accessory muscle Cardiovascular system: S1 &  S2 +, No JVD. Gastrointestinal system: Abdomen soft,NT,ND, BS+ Nervous System:Alert, awake, moving extremities. Extremities: LE edema neg,distal peripheral pulses palpable.  Skin: No rashes,no icterus. MSK: Normal muscle bulk,tone, power  Medications reviewed:  Scheduled Meds:  apixaban  5 mg Oral BID   furosemide  40 mg Oral Daily   pantoprazole  40 mg Oral Daily   sodium chloride flush  3 mL Intravenous Q12H   tamsulosin  0.4 mg Oral QPC supper   Continuous Infusions:  lactated ringers 75  mL/hr at 12/31/22 4098    Diet Order             Diet Heart Room service appropriate? Yes; Fluid consistency: Thin  Diet effective now                   Intake/Output Summary (Last 24 hours) at 12/31/2022 1114 Last data filed at 12/31/2022 0349 Gross per 24 hour  Intake 240 ml  Output 800 ml  Net -560 ml   Net IO Since Admission: -560 mL [12/31/22 1114]  Wt Readings from Last 3 Encounters:  12/30/22 59.2 kg  12/29/22 62.7 kg  12/06/22 65 kg     Unresulted Labs (From admission, onward)    None      Data Reviewed: I have personally reviewed following labs and imaging studies CBC: Recent Labs  Lab 12/25/22 2137 12/26/22 0456 12/30/22 1015  WBC 6.5 8.1 6.6  NEUTROABS 4.2  --  4.4  HGB 7.8* 8.2* 9.0*  HCT 23.8* 25.1* 27.9*  MCV 103.5* 103.3* 104.1*  PLT 367 413* 427*   Basic Metabolic Panel:  Recent Labs  Lab 12/26/22 0456 12/26/22 1654 12/27/22 0425 12/28/22 0345 12/29/22 0828 12/30/22 1015 12/31/22 0606  NA 143  --  136  --  135 137 133*  K 3.4*  --  3.0* 4.2 3.7 3.4* 3.9  CL 109  --  103  --  99 98 96*  CO2 21*  --  22  --  23 28 27   GLUCOSE 126*  --  111*  --  110* 109* 112*  BUN 17  --  15  --  18 19 21   CREATININE 1.32*  --  1.38*  --  1.74* 1.89* 1.60*  CALCIUM 7.5*  --  7.0*  --  8.2* 9.1 8.8*  MG 0.7* 1.5* 1.9 1.6* 2.6* 2.0  --    GFR: Estimated Creatinine Clearance: 22.4 mL/min (A) (by C-G formula based on SCr of 1.6 mg/dL (H)). Liver Function Tests:  Recent Labs  Lab 12/26/22 0804 12/30/22 1015  AST  --  20  ALT  --  13  ALKPHOS  --  69  BILITOT  --  0.4  PROT  --  7.7  ALBUMIN 3.3* 3.4*  Sepsis Labs: Recent Labs  Lab 12/26/22 0456 12/30/22 1125  PROCALCITON <0.10  --   LATICACIDVEN  --  0.9    Recent Results (from the past 240 hour(s))  Culture, blood (Routine X 2) w Reflex to ID Panel     Status: None (Preliminary result)   Collection Time: 12/30/22 11:13 AM   Specimen: BLOOD  Result Value Ref Range Status   Specimen  Description   Final    BLOOD RIGHT ANTECUBITAL Performed at [facility], [address], [place], [place] [address]    Special Requests   Final    BOTTLES DRAWN AEROBIC AND ANAEROBIC Blood Culture adequate volume Performed at [facility], [address] [place],  [place] [address]    Culture   Final    NO GROWTH < 24 HOURS Performed at [facility], [address], [place], [place] [address]    Report Status PENDING  Incomplete  Culture, blood (Routine X 2) w Reflex to ID Panel     Status: None (Preliminary result)   Collection Time: 12/30/22 11:15 AM   Specimen: BLOOD RIGHT ARM  Result Value Ref Range Status   Specimen Description   Final    BLOOD RIGHT ARM Performed at [facility], [address] [place], [place] [address]    Special Requests   Final    BOTTLES DRAWN AEROBIC AND ANAEROBIC Blood Culture adequate volume Performed at [facility], [address] [place], [place] [address]    Culture   Final    NO GROWTH < 24 HOURS Performed at [facility], [address] [place], [place] [address]    Report Status PENDING  Incomplete  MRSA Next Gen by PCR, Nasal     Status: None   Collection Time: 12/30/22  6:00 PM   Specimen: Nasal Mucosa; Nasal Swab  Result Value Ref Range Status   MRSA by PCR Next Gen NOT DETECTED NOT DETECTED Final    Comment: (NOTE) The GeneXpert MRSA Assay (FDA approved for NASAL specimens only), is one component of a comprehensive MRSA colonization surveillance program. It is not intended to diagnose MRSA infection nor to guide or monitor treatment for MRSA infections. Test performance is not FDA approved in patients less than 71 years old. Performed at [facility], [address], [place], [place] [address]     Antimicrobials: Anti-infectives (From admission, onward)    None      Culture/Microbiology    Component Value Date/Time   SDES   12/30/2022 1115    BLOOD RIGHT ARM Performed at [facility], [address], [place], [place] [address]    SPECREQUEST  12/30/2022 1115    BOTTLES DRAWN AEROBIC AND ANAEROBIC Blood Culture adequate volume Performed at [facility], [address], [place], [place] [address]    CULT  12/30/2022 1115    NO GROWTH < 24 HOURS Performed at [facility], [address] [place], [place] [address]    REPTSTATUS PENDING 12/30/2022 1115   Radiology Studies: US RENAL  Result Date: 12/30/2022 CLINICAL DATA:  Urinary retention EXAM: RENAL / URINARY TRACT ULTRASOUND COMPLETE COMPARISON:  CT 12/13/2022, ultrasound 12/08/2022 FINDINGS: Right Kidney: Renal measurements: 8.1 by 3.4 x 4.5 cm = volume: 65.9 mL. Echogenicity within normal limits. No mass or hydronephrosis visualized. Left Kidney: Renal measurements: 8.3 x 4 x 5.2 cm = volume: 90.4 mL. Echogenicity within normal limits. No mass or hydronephrosis visualized. Bladder: Appears normal for degree of bladder distention. Other: Liver appears slightly echogenic IMPRESSION: 1. Negative for hydronephrosis. 2. Liver appears slightly echogenic, question mild steatosis. Electronically Signed   By: Jasmine Pang M.D.   On: 12/30/2022 16:23   DG Chest Port 1 View  Result Date: 12/30/2022 CLINICAL DATA:  Fatigue. EXAM: PORTABLE CHEST 1 VIEW COMPARISON:  Chest x-ray dated December 27, 2022. FINDINGS: Stable cardiomediastinal silhouette with mild cardiomegaly and moderate hiatal hernia. Resolved interstitial thickening at the lung bases. Resolved trace pleural effusions. No consolidation or pneumothorax. No acute osseous abnormality. IMPRESSION: 1. No active disease. Resolved pulmonary edema and trace pleural effusions. Electronically Signed   By: Obie Dredge M.D.   On:  12/30/2022 10:41     LOS: 0 days   Total time spent in review of labs and imaging, patient evaluation, formulation of plan, documentation and communication  with family: 35 minutes  Lanae Boast, MD [facility]  12/31/2022, 11:14 AM

## 2022-12-31 NOTE — Evaluation (Signed)
Occupational Therapy Evaluation Patient Details Name: Stacey [name] MRN: [id number] DOB: 1940/11/09 Today's Date: 12/31/2022   History of Present Illness Pt is a 82 y/o female presenting on 11/28 with AMS from [facility] SNF. Noted most recent hospital admission for CHF exacerbation, but several recent admissions.  Pt with hx including but not limited to breast CA with bone mets on chronic immunotherapy, L UE lymphedema, OA, DVT, chronic diarrhea, anemia.   Clinical Impression   Patient admitted from [facility] SNF for above and limited by problem list below.  She requires min assist for transfers and mobility using RW, up to min assist for ADLs.  Intermittent cueing for safety with RW.  She is on 3L O2 upon entry, decreased to 2L during session with SPO2 stable.  Cognition appears WFL.  Patient will benefit from continued OT services acutely and after dc at inpatient setting with <3hrs/day to optimize independence, safety with ADLs and mobility for ultimate goal of returning home alone.       If plan is discharge home, recommend the following: A little help with walking and/or transfers;Assistance with cooking/housework;Help with stairs or ramp for entrance;Assist for transportation;A little help with bathing/dressing/bathroom    Functional Status Assessment  Patient has had a recent decline in their functional status and demonstrates the ability to make significant improvements in function in a reasonable and predictable amount of time.  Equipment Recommendations  Other (comment) (defer)    Recommendations for Other Services       Precautions / Restrictions Precautions Precautions: Fall Precaution Comments: watch O2 Restrictions Weight Bearing Restrictions: No      Mobility Bed Mobility Overal bed mobility: Needs Assistance Bed Mobility: Supine to Sit     Supine to sit: Supervision          Transfers Overall transfer level: Needs assistance Equipment used: Rolling  walker (2 wheels) Transfers: Sit to/from Stand Sit to Stand: Min assist                  Balance Overall balance assessment: Needs assistance Sitting-balance support: No upper extremity supported, Feet supported Sitting balance-Leahy Scale: Good     Standing balance support: Bilateral upper extremity supported, Single extremity supported, During functional activity Standing balance-Leahy Scale: Poor Standing balance comment: static fair, dynamic poor (reliant on UE support)                           ADL either performed or assessed with clinical judgement   ADL Overall ADL's : Needs assistance/impaired     Grooming: Set up;Sitting           Upper Body Dressing : Set up;Sitting   Lower Body Dressing: Minimal assistance;Sit to/from stand Lower Body Dressing Details (indicate cue type and reason): able to manage socks using figure 4 technique, min assist in standing Toilet Transfer: Minimal assistance;Ambulation Toilet Transfer Details (indicate cue type and reason): RW         Functional mobility during ADLs: Minimal assistance;Rolling walker (2 wheels);Cueing for safety       Vision Baseline Vision/History: 1 Wears glasses       Perception         Praxis         Pertinent Vitals/Pain Pain Assessment Pain Assessment: Faces Faces Pain Scale: No hurt Pain Intervention(s): Monitored during session     Extremity/Trunk Assessment Upper Extremity Assessment Upper Extremity Assessment: RUE deficits/detail;LUE deficits/detail RUE Deficits / Details: WFL, chronic arthritic change  in fingers RUE Coordination: decreased fine motor LUE Deficits / Details: Chronic edema due to reported lymphedema. Chronic shoulder AROM limitations. Functional grip strength.  Currently wearing arm sleeve LUE Coordination: decreased fine motor   Lower Extremity Assessment Lower Extremity Assessment: Defer to PT evaluation       Communication  Communication Communication: No apparent difficulties   Cognition Arousal: Alert Behavior During Therapy: WFL for tasks assessed/performed Overall Cognitive Status: Within Functional Limits for tasks assessed                                 General Comments: oriented and appropraite throughout session     General Comments  on 3L upon entry, decreased to 2L with SpO2 stable    Exercises     Shoulder Instructions      Home Living Family/patient expects to be discharged to:: Skilled nursing facility                                        Prior Functioning/Environment Prior Level of Function : Needs assist             Mobility Comments: using RW, independent prior to hopistalizations starting in Oct ADLs Comments: some assist with ADLs as needed, was independnet prior to admission in oct        OT Problem List: Decreased strength;Decreased activity tolerance;Impaired balance (sitting and/or standing);Decreased knowledge of use of DME or AE;Cardiopulmonary status limiting activity      OT Treatment/Interventions: Self-care/ADL training;Energy conservation;DME and/or AE instruction;Therapeutic activities;Patient/family education;Balance training    OT Goals(Current goals can be found in the care plan section) Acute Rehab OT Goals Patient Stated Goal: get better and get home OT Goal Formulation: With patient Time For Goal Achievement: 01/14/23 Potential to Achieve Goals: Good  OT Frequency: Min 1X/week    Co-evaluation              AM-PAC OT "6 Clicks" Daily Activity     Outcome Measure Help from another person eating meals?: None Help from another person taking care of personal grooming?: A Little Help from another person toileting, which includes using toliet, bedpan, or urinal?: A Little Help from another person bathing (including washing, rinsing, drying)?: A Little Help from another person to put on and taking off regular upper  body clothing?: A Little Help from another person to put on and taking off regular lower body clothing?: A Little 6 Click Score: 19   End of Session Equipment Utilized During Treatment: Oxygen;Rolling walker (2 wheels) Nurse Communication: Mobility status (O2 to 2L)  Activity Tolerance: Patient tolerated treatment well Patient left: in chair;with call bell/phone within reach;with chair alarm set  OT Visit Diagnosis: Muscle weakness (generalized) (M62.81);Unsteadiness on feet (R26.81)                Time: 5784-6962 OT Time Calculation (min): 24 min Charges:  OT General Charges $OT Visit: 1 Visit OT Evaluation $OT Eval Low Complexity: 1 Low  Barry Brunner, OT Acute Rehabilitation Services Office [phone]   Chancy Milroy 12/31/2022, 12:56 PM

## 2022-12-31 NOTE — Care Management Obs Status (Signed)
MEDICARE OBSERVATION STATUS NOTIFICATION   Patient Details  Name: Stacey [name] MRN: [id number] Date of Birth: [birth year]   Medicare Observation Status Notification Given:  Yes    Otelia Santee, LCSW 12/31/2022, 11:29 AM

## 2022-12-31 NOTE — TOC Initial Note (Signed)
Transition of Care [facility]) - Initial/Assessment Note    Patient Details  Name: Stacey [name] MRN: [id number] Date of Birth: 1940/05/12  Transition of Care [facility]) CM/SW Contact:    Otelia Santee, LCSW Phone Number: 12/31/2022, 12:36 PM  Clinical Narrative:                 Pt recently discharged to [facility] for ST SNF on 11/26. Pt returned to hospital due to AMS on 11/27. Met with pt and confirmed plan to return to [facility] at discharge. Pt traditional MCR and under observation status however, has had 3 midnight inpatient stay within last 30 days and is active with [facility].  CSW contacted [facility] to confirm pt's ability to return and am currently awaiting response.   Expected Discharge Plan: Skilled Nursing Facility Barriers to Discharge: No Barriers Identified   Patient Goals and CMS Choice Patient states their goals for this hospitalization and ongoing recovery are:: To return to [facility] for ST SNF CMS Medicare.gov Compare Post Acute Care list provided to:: Patient Choice offered to / list presented to : Patient [place] ownership interest in [facility].provided to:: Patient    Expected Discharge Plan and Services In-house Referral: Clinical Social Work Discharge Planning Services: NA Post Acute Care Choice: Resumption of Svcs/PTA Provider, Skilled Nursing Facility Living arrangements for the past 2 months: Single Family Home                 DME Arranged: N/A DME Agency: NA                  Prior Living Arrangements/Services Living arrangements for the past 2 months: Single Family Home Lives with:: Self Patient language and need for interpreter reviewed:: Yes Do you feel safe going back to the place where you live?: Yes      Need for Family Participation in Patient Care: No (Comment) Care giver support system in place?: Yes (comment) Current home services: DME Criminal Activity/Legal Involvement Pertinent to Current Situation/Hospitalization: No -  Comment as needed  Activities of Daily Living   ADL Screening (condition at time of admission) Independently performs ADLs?: No Does the patient have a NEW difficulty with bathing/dressing/toileting/self-feeding that is expected to last >3 days?: No Does the patient have a NEW difficulty with getting in/out of bed, walking, or climbing stairs that is expected to last >3 days?: No Does the patient have a NEW difficulty with communication that is expected to last >3 days?: No Is the patient deaf or have difficulty hearing?: No Does the patient have difficulty seeing, even when wearing glasses/contacts?: No Does the patient have difficulty concentrating, remembering, or making decisions?: No  Permission Sought/Granted Permission sought to share information with : Facility Medical sales representative, Family Supports Permission granted to share information with : Yes, Verbal Permission Granted  Share Information with NAME: Blondell Reveal  Permission granted to share info w AGENCY: [facility]  Permission granted to share info w Relationship: Daughter     Emotional Assessment Appearance:: Appears stated age Attitude/Demeanor/Rapport: Engaged Affect (typically observed): Accepting Orientation: : Oriented to Self, Oriented to Place, Oriented to  Time, Oriented to Situation Alcohol / Substance Use: Not Applicable Psych Involvement: No (comment)  Admission diagnosis:  Altered mental status [R41.82] AKI (acute kidney injury) (HCC) [N17.9] Patient Active Problem List   Diagnosis Date Noted   Altered mental status 12/30/2022   Acute on chronic diastolic heart failure (HCC) 12/26/2022   Hypokalemia 12/26/2022   Acute on chronic hypoxic respiratory  failure (HCC) 12/05/2022   Metabolic acidosis 12/04/2022   Hypocalcemia 12/04/2022   Fatigue 12/04/2022   Pneumonia 12/04/2022   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   DVT (deep venous thrombosis) (HCC) 12/04/2022   Diarrhea  12/04/2022   Sepsis (HCC) 11/17/2022   History of DVT (deep vein thrombosis) 11/17/2022   Current chronic use of systemic steroids 11/17/2022   Tachypnea 11/17/2022   Pulmonary nodule 11/17/2022   Acute deep vein thrombosis (DVT) of femoral vein of right lower extremity (HCC) 04/13/2022   Acute upper respiratory infection 05/16/2021   Nasal dryness 02/04/2021   Chronic sinusitis 11/25/2020   Allergic rhinitis 07/08/2020   Hypomagnesemia 06/10/2020   Goals of care, counseling/discussion 06/21/2019   Family history of breast cancer    Family history of pancreatic cancer    Family history of uterine cancer    Family history of lymphoma    Family history of lung cancer    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HCC) 06/02/2019   Recurrent cancer of left breast (HCC) 05/15/2019   Pes anserine bursitis 03/31/2019   HTN (hypertension) 02/10/2019   Hyperglycemia 06/21/2018   Left carpal tunnel syndrome 02/22/2018   Rotator cuff arthropathy of left shoulder 09/20/2017   Arthritis of hand 08/23/2017   Pain in right hand 08/23/2017   Cervical radiculitis 04/09/2017   Left shoulder pain 04/09/2017   Dyspnea on exertion 05/14/2016   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   Lymphedema of left arm 01/14/2016   Left arm swelling 12/25/2015   SVT (supraventricular tachycardia) (HCC)    Left-sided carotid artery disease (HCC) 03/08/2015   Dizziness 01/24/2015   Urinary frequency 01/13/2015   Dizziness and giddiness 01/09/2015   Gallstones 02/21/2013   Malignant neoplasm of upper-inner quadrant of left breast in female, estrogen receptor positive (HCC) 11/28/2012   Muscle cramping 09/12/2012   Right hip pain 09/12/2012   Lower back pain 09/12/2012   COPD GOLD I     Hx of radiation therapy    Right shoulder pain 09/14/2011   Preventative health care 07/29/2010   Skin lesion of left leg 07/29/2010   Paresthesia 07/29/2010   GERD 03/28/2010   Constipation  03/28/2010   Osteoarthrosis, 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   Anxiety state 05/03/2009   Hyperlipidemia 06/14/2007   FATIGUE 06/14/2007   Anemia 12/17/2006   Diverticulosis of colon 12/17/2006   Cough 12/17/2006   IRRITABLE BOWEL SYNDROME, HX OF 12/17/2006   PCP:  [name], MD Pharmacy:   [facility] [phone] [place], [place] - [address] AT [facility] [address] [facility] [address] [place] [address] Phone: [phone] Fax: [phone]  [place] [facility] [address] [place] [address] Phone: [phone] Fax: [phone]     Social Determinants of Health (SDOH) Social History: SDOH Screenings   Food Insecurity: No Food Insecurity (12/30/2022)  Housing: Patient Declined (12/30/2022)  Transportation Needs: No Transportation Needs (12/30/2022)  Utilities: Not At Risk (12/30/2022)  Alcohol Screen: Low Risk  (11/07/2022)  Depression (PHQ2-9): Low Risk  (11/10/2022)  Financial Resource Strain: Low Risk  (11/07/2022)  Physical Activity: Inactive (11/07/2022)  Social Connections: Socially Isolated (11/07/2022)  Stress: No Stress Concern Present (11/07/2022)  Tobacco Use: Medium Risk (12/30/2022)  Health Literacy: Adequate Health Literacy (11/10/2022)   SDOH Interventions:     Readmission Risk Interventions    12/28/2022    3:55 PM 12/27/2022    2:25 PM 11/19/2022  1:54 PM  Readmission Risk Prevention Plan  Transportation Screening  Complete Complete  PCP or Specialist Appt within 5-7 Days   Complete  Home Care Screening   Complete  Medication Review (RN CM)   Complete  Medication Review (RN Care Manager) Complete    PCP or Specialist appointment within 3-5 days of discharge Complete Complete   HRI or Home Care Consult Complete    SW Recovery Care/Counseling Consult Complete Complete   Palliative Care Screening Not Applicable    Skilled Nursing Facility Complete Complete

## 2023-01-01 DIAGNOSIS — N179 Acute kidney failure, unspecified: Secondary | ICD-10-CM | POA: Diagnosis not present

## 2023-01-01 LAB — BASIC METABOLIC PANEL
Anion gap: 10 (ref 5–15)
BUN: 19 mg/dL (ref 8–23)
CO2: 28 mmol/L (ref 22–32)
Calcium: 9.1 mg/dL (ref 8.9–10.3)
Chloride: 99 mmol/L (ref 98–111)
Creatinine, Ser: 1.76 mg/dL — ABNORMAL HIGH (ref 0.44–1.00)
GFR, Estimated: 29 mL/min — ABNORMAL LOW (ref >60)
Glucose, Bld: 113 mg/dL — ABNORMAL HIGH (ref 70–99)
Potassium: 3.6 mmol/L (ref 3.5–5.1)
Sodium: 137 mmol/L (ref 135–145)

## 2023-01-01 MED ORDER — SODIUM CHLORIDE 0.9 % IV SOLN: INTRAVENOUS | Status: AC

## 2023-01-01 MED ORDER — LACTATED RINGERS IV SOLN: INTRAVENOUS | Status: AC

## 2023-01-01 MED ORDER — ANASTROZOLE 1 MG PO TABS
1.0000 mg | ORAL_TABLET | Freq: Every day | ORAL | Status: DC
  Administered 2023-01-01 – 2023-01-03 (×3): 1 mg via ORAL
  Filled 2023-01-01 (×3): qty 1

## 2023-01-01 MED ORDER — APIXABAN 2.5 MG PO TABS
2.5000 mg | ORAL_TABLET | Freq: Two times a day (BID) | ORAL | Status: DC
  Administered 2023-01-01 – 2023-01-03 (×5): 2.5 mg via ORAL
  Filled 2023-01-01 (×5): qty 1

## 2023-01-01 NOTE — Progress Notes (Signed)
Pt has refused bladder scan at this time.

## 2023-01-01 NOTE — Progress Notes (Signed)
PROGRESS NOTE Stacey [name]  ACZ:[id number] DOB: 09/16/1940 DOA: 12/30/2022 PCP: [name], MD  Brief Narrative/Hospital Course: 82 year old female with metastatic breast cancer to bone on chemotherapy, CHF with preserved EF CKD stage IIIb b/l creat Around 1.2-1.5 , HTN DVT chronic diarrhea, chronic anemia and recent admission 11/22 and discharged 11/26 for acute on chronic CHF, acute on chronic respiratory failure, appreciated retention: Discharge to SNF return with weakness question of confusion In the ED afebrile hemodynamically stable, labs showed creatinine elevated 1.89 BNP 131 lactic acid 0.9 CBC with hemoglobin 9 UA fairly unremarkable, blood culture obtained chest x-ray unremarkable.  Again noted to be with urine retention and straight cath yielded 800 cc of urine.  Admission was requested for worsening of her renal function and weakness.  Her mental status was normal on admission. Patient has been voiding well not needing Foley catheter-placed on Flomax Mentating well alert awake oriented x 3 creatinine improving on IV fluids,, seen by PT OT and planning for discharge back to skilled nursing facility encourage oral hydration      Subjective: Seen and examined, Overnight vitals/labs/events reviewed  She is resting comfortably, has no complaints vitals stable Labs shows creatinine further trending up sodium improved   Assessment and Plan: Principal Problem:   Altered mental status Active Problems:   AMS (altered mental status)  AKI on CKD stage IIIb b/l creat Around 1.2-1.5: Creatinine remains elevated slightly trending up we will keep an additional 24 hours IV fluids hold Lasix today.  Encouraged oral intake. Recent Labs    12/14/22 0424 12/15/22 0412 12/16/22 0408 12/25/22 2137 12/26/22 0456 12/27/22 0425 12/28/22 0345 12/29/22 0828 12/30/22 1015 12/31/22 0606 01/01/23 0832  BUN 22 21 18 19 17 15   --  18 19 21 19   CREATININE 2.41* 1.86* 1.52* 1.24* 1.32* 1.38*  --   1.74* 1.89* 1.60* 1.76*  CO2 26 24 21* 20* 21* 22  --  23 28 27 28   K 3.7 3.3* 3.4* 2.8* 3.4* 3.0* 4.2 3.7 3.4* 3.9 3.6    Urine retention: Has resolved continue Flomax monitor voiding.    Generalized weakness/confusion at the facility: Likely multifactorial deconditioning debility.  She is alert awake and oriented.Robaxin discontinued all psychotropic medication pain medication continue PT OT evaluation likely needs to return to facility for SNF  History of DVT in march: Continue Eliquis-apparently discontinued at SNF - but will need to continue at this lower dose given her malignancy at risk of DVT, she will need to follow-up with oncology for further recommendation.  History of breast cancer: Resumed Arimidex. HER Ibrance previously placed on hold by oncologist   Chronic HFpEF: Stable.home lasix on hold for now.  Monitor fluid status currently no signs of fluid overload.  DVT prophylaxis: apixaban (ELIQUIS) tablet 2.5 mg Start: 01/01/23 1000 Code Status:   Code Status: Full Code Family Communication: plan of care discussed with patient at bedside. Patient status is:  admitted as observation changed to inpatient as she continues to require IV fluids for AKI  Level of care: Med-Surg  Dispo: The patient is from: SNF            Anticipated disposition: Likely return to SNF in next 24 hours if creatinine improves to stay aware   Objective: Vitals last 24 hrs: Vitals:   12/31/22 0351 12/31/22 1302 12/31/22 1951 01/01/23 0429  BP: 128/60 (!) 113/55 116/64 (!) 103/41  Pulse: (!) 110 94 (!) 102 95  Resp: 18 16 20 18   Temp: 98.4 F (  36.9 C) 98.4 F (36.9 C) 98.3 F (36.8 C) 98.4 F (36.9 C)  TempSrc: Oral Oral Oral Oral  SpO2: 100% 98% 100% 97%  Weight:      Height:       Weight change:   Physical Examination: General exam: alert awake, oriented  HEENT:Oral mucosa moist, Ear/Nose WNL grossly Respiratory system: Bilaterally clear BS,no use of accessory muscle Cardiovascular  system: S1 & S2 +, No JVD. Gastrointestinal system: Abdomen soft,NT,ND, BS+ Nervous System: Alert, awake, moving all extremities,and following commands. Extremities: LE edema neg,distal peripheral pulses palpable and warm.  Skin: No rashes,no icterus. MSK: Normal muscle bulk,tone, power   Medications reviewed:  Scheduled Meds:  anastrozole  1 mg Oral Daily   apixaban  2.5 mg Oral BID   pantoprazole  40 mg Oral Daily   sodium chloride flush  3 mL Intravenous Q12H   tamsulosin  0.4 mg Oral QPC supper   Continuous Infusions:  sodium chloride 75 mL/hr at 01/01/23 1016    Diet Order             Diet Heart Room service appropriate? Yes; Fluid consistency: Thin  Diet effective now                   Intake/Output Summary (Last 24 hours) at 01/01/2023 1118 Last data filed at 01/01/2023 1006 Gross per 24 hour  Intake 1206.7 ml  Output 1500 ml  Net -293.3 ml   Net IO Since Admission: -853.3 mL [01/01/23 1118]  Wt Readings from Last 3 Encounters:  12/30/22 59.2 kg  12/29/22 62.7 kg  12/06/22 65 kg     Unresulted Labs (From admission, onward)    None      Data Reviewed: I have personally reviewed following labs and imaging studies CBC: Recent Labs  Lab 12/25/22 2137 12/26/22 0456 12/30/22 1015  WBC 6.5 8.1 6.6  NEUTROABS 4.2  --  4.4  HGB 7.8* 8.2* 9.0*  HCT 23.8* 25.1* 27.9*  MCV 103.5* 103.3* 104.1*  PLT 367 413* 427*   Basic Metabolic Panel:  Recent Labs  Lab 12/26/22 1654 12/27/22 0425 12/28/22 0345 12/29/22 0828 12/30/22 1015 12/31/22 0606 01/01/23 0832  NA  --  136  --  135 137 133* 137  K  --  3.0* 4.2 3.7 3.4* 3.9 3.6  CL  --  103  --  99 98 96* 99  CO2  --  22  --  23 28 27 28   GLUCOSE  --  111*  --  110* 109* 112* 113*  BUN  --  15  --  18 19 21 19   CREATININE  --  1.38*  --  1.74* 1.89* 1.60* 1.76*  CALCIUM  --  7.0*  --  8.2* 9.1 8.8* 9.1  MG 1.5* 1.9 1.6* 2.6* 2.0  --   --    GFR: Estimated Creatinine Clearance: 20.4 mL/min (A) (by  C-G formula based on SCr of 1.76 mg/dL (H)). Liver Function Tests:  Recent Labs  Lab 12/26/22 0804 12/30/22 1015  AST  --  20  ALT  --  13  ALKPHOS  --  69  BILITOT  --  0.4  PROT  --  7.7  ALBUMIN 3.3* 3.4*  Sepsis Labs: Recent Labs  Lab 12/26/22 0456 12/30/22 1125  PROCALCITON <0.10  --   LATICACIDVEN  --  0.9    Recent Results (from the past 240 hour(s))  Culture, blood (Routine X 2) w Reflex to ID Panel  Status: None (Preliminary result)   Collection Time: 12/30/22 11:13 AM   Specimen: BLOOD  Result Value Ref Range Status   Specimen Description   Final    BLOOD RIGHT ANTECUBITAL Performed at [facility], [address], [place], [place] [address]    Special Requests   Final    BOTTLES DRAWN AEROBIC AND ANAEROBIC Blood Culture adequate volume Performed at [facility], [address], [place], [place] [address]    Culture   Final    NO GROWTH 2 DAYS Performed at [facility], [address] [place], [place] [address]    Report Status PENDING  Incomplete  Culture, blood (Routine X 2) w Reflex to ID Panel     Status: None (Preliminary result)   Collection Time: 12/30/22 11:15 AM   Specimen: BLOOD RIGHT ARM  Result Value Ref Range Status   Specimen Description   Final    BLOOD RIGHT ARM Performed at [facility], [address], [place], [place] [address]    Special Requests   Final    BOTTLES DRAWN AEROBIC AND ANAEROBIC Blood Culture adequate volume Performed at [facility], [address] [place], [place] [address]    Culture   Final    NO GROWTH 2 DAYS Performed at [facility], [address] [place], [place] [address]    Report Status PENDING  Incomplete  MRSA Next Gen by PCR, Nasal     Status: None   Collection Time: 12/30/22  6:00 PM   Specimen: Nasal Mucosa; Nasal Swab  Result Value Ref Range Status   MRSA by PCR Next Gen NOT DETECTED NOT DETECTED Final    Comment:  (NOTE) The GeneXpert MRSA Assay (FDA approved for NASAL specimens only), is one component of a comprehensive MRSA colonization surveillance program. It is not intended to diagnose MRSA infection nor to guide or monitor treatment for MRSA infections. Test performance is not FDA approved in patients less than 74 years old. Performed at [facility], [address] [place], [place] [address]     Antimicrobials: Anti-infectives (From admission, onward)    None      Culture/Microbiology    Component Value Date/Time   SDES  12/30/2022 1115    BLOOD RIGHT ARM Performed at [facility], [address], [place], [place] [address]    SPECREQUEST  12/30/2022 1115    BOTTLES DRAWN AEROBIC AND ANAEROBIC Blood Culture adequate volume Performed at [facility], [address], [place], [place] [address]    CULT  12/30/2022 1115    NO GROWTH 2 DAYS Performed at [facility], [address] [place], [place] [address]    REPTSTATUS PENDING 12/30/2022 1115   Radiology Studies: US RENAL  Result Date: 12/30/2022 CLINICAL DATA:  Urinary retention EXAM: RENAL / URINARY TRACT ULTRASOUND COMPLETE COMPARISON:  CT 12/13/2022, ultrasound 12/08/2022 FINDINGS: Right Kidney: Renal measurements: 8.1 by 3.4 x 4.5 cm = volume: 65.9 mL. Echogenicity within normal limits. No mass or hydronephrosis visualized. Left Kidney: Renal measurements: 8.3 x 4 x 5.2 cm = volume: 90.4 mL. Echogenicity within normal limits. No mass or hydronephrosis visualized. Bladder: Appears normal for degree of bladder distention. Other: Liver appears slightly echogenic IMPRESSION: 1. Negative for hydronephrosis. 2. Liver appears slightly echogenic, question mild steatosis. Electronically Signed   By: Jasmine Pang M.D.   On: 12/30/2022 16:23     LOS: 1 day  Total time spent in review of labs and imaging, patient evaluation, formulation of plan, documentation and communication with  family: 35 minutes  Lanae Boast, MD [facility]  01/01/2023, 11:18 AM

## 2023-01-02 DIAGNOSIS — N179 Acute kidney failure, unspecified: Secondary | ICD-10-CM | POA: Diagnosis not present

## 2023-01-02 LAB — BASIC METABOLIC PANEL
Anion gap: 11 (ref 5–15)
BUN: 18 mg/dL (ref 8–23)
CO2: 25 mmol/L (ref 22–32)
Calcium: 8.7 mg/dL — ABNORMAL LOW (ref 8.9–10.3)
Chloride: 100 mmol/L (ref 98–111)
Creatinine, Ser: 1.66 mg/dL — ABNORMAL HIGH (ref 0.44–1.00)
GFR, Estimated: 31 mL/min — ABNORMAL LOW (ref >60)
Glucose, Bld: 146 mg/dL — ABNORMAL HIGH (ref 70–99)
Potassium: 3.5 mmol/L (ref 3.5–5.1)
Sodium: 136 mmol/L (ref 135–145)

## 2023-01-02 MED ORDER — LORATADINE 10 MG PO TABS
10.0000 mg | ORAL_TABLET | ORAL | Status: DC
  Administered 2023-01-03: 10 mg via ORAL
  Filled 2023-01-02: qty 1

## 2023-01-02 MED ORDER — SODIUM CHLORIDE 0.9 % IV SOLN: INTRAVENOUS | Status: AC

## 2023-01-02 MED ORDER — TRIAMCINOLONE ACETONIDE 55 MCG/ACT NA AERO
2.0000 | INHALATION_SPRAY | Freq: Every day | NASAL | Status: DC
  Administered 2023-01-02: 2 via NASAL
  Filled 2023-01-02: qty 21.6

## 2023-01-02 MED ORDER — ASPIRIN 81 MG PO TBEC: 81.0000 mg | DELAYED_RELEASE_TABLET | Freq: Every morning | ORAL | Status: DC

## 2023-01-02 MED ORDER — GUAIFENESIN-DM 100-10 MG/5ML PO SYRP: 5.0000 mL | ORAL_SOLUTION | Freq: Three times a day (TID) | ORAL | Status: DC | PRN

## 2023-01-02 MED ORDER — BUDESONIDE 0.25 MG/2ML IN SUSP
0.2500 mg | Freq: Two times a day (BID) | RESPIRATORY_TRACT | Status: DC
  Administered 2023-01-02 – 2023-01-03 (×2): 0.25 mg via RESPIRATORY_TRACT
  Filled 2023-01-02 (×2): qty 2

## 2023-01-02 MED ORDER — LORATADINE 10 MG PO TABS: 10.0000 mg | ORAL_TABLET | Freq: Every morning | ORAL | Status: DC

## 2023-01-02 NOTE — TOC Progression Note (Signed)
Transition of Care [facility]) - Progression Note    Patient Details  Name: Stacey [name] MRN: [id number] Date of Birth: 08/19/1940  Transition of Care [facility]) CM/SW Contact  Darleene Cleaver, [place] Phone Number: 01/02/2023, 3:10 PM  Clinical Narrative:     CSW spoke to [facility] admissions Dedre, and she said patient can return to SNF tomorrow if she is medically ready.  CSW updated attending physician.  Expected Discharge Plan: Skilled Nursing Facility Barriers to Discharge: No Barriers Identified  Expected Discharge Plan and Services In-house Referral: Clinical Social Work Discharge Planning Services: NA Post Acute Care Choice: Resumption of Svcs/PTA Provider, Skilled Nursing Facility Living arrangements for the past 2 months: Single Family Home                 DME Arranged: N/A DME Agency: NA                   Social Determinants of Health (SDOH) Interventions SDOH Screenings   Food Insecurity: No Food Insecurity (12/30/2022)  Housing: Patient Declined (12/30/2022)  Transportation Needs: No Transportation Needs (12/30/2022)  Utilities: Not At Risk (12/30/2022)  Alcohol Screen: Low Risk  (11/07/2022)  Depression (PHQ2-9): Low Risk  (11/10/2022)  Financial Resource Strain: Low Risk  (11/07/2022)  Physical Activity: Inactive (11/07/2022)  Social Connections: Socially Isolated (11/07/2022)  Stress: No Stress Concern Present (11/07/2022)  Tobacco Use: Medium Risk (12/30/2022)  Health Literacy: Adequate Health Literacy (11/10/2022)    Readmission Risk Interventions    12/28/2022    3:55 PM 12/27/2022    2:25 PM 11/19/2022    1:54 PM  Readmission Risk Prevention Plan  Transportation Screening  Complete Complete  PCP or Specialist Appt within 5-7 Days   Complete  Home Care Screening   Complete  Medication Review (RN CM)   Complete  Medication Review (RN Care Manager) Complete    PCP or Specialist appointment within 3-5 days of discharge Complete Complete   HRI or  Home Care Consult Complete    SW Recovery Care/Counseling Consult Complete Complete   Palliative Care Screening Not Applicable    Skilled Nursing Facility Complete Complete

## 2023-01-02 NOTE — Plan of Care (Signed)

## 2023-01-02 NOTE — Progress Notes (Signed)
Physical Therapy Treatment Patient Details Name: Stacey [name] MRN: [id number] DOB: 22-Aug-1940 Today's Date: 01/02/2023   History of Present Illness Pt is a 82 y/o female presenting on 11/28 with AMS from [facility]. Noted most recent hospital admission for CHF exacerbation, but several recent admissions.  Pt with hx including but not limited to breast CA with bone mets on chronic immunotherapy, L UE lymphedema, OA, DVT, chronic diarrhea, anemia.    PT Comments  Pt motivated and eager to move.  "I need to get out of here, I am just so bored".   If plan is discharge home, recommend the following: Assistance with cooking/housework;Help with stairs or ramp for entrance;A little help with walking and/or transfers;A little help with bathing/dressing/bathroom   Can travel by private vehicle     No  Equipment Recommendations  None recommended by PT    Recommendations for Other Services       Precautions / Restrictions Precautions Precautions: Fall Precaution Comments: watch O2 Restrictions Weight Bearing Restrictions: No     Mobility  Bed Mobility Overal bed mobility: Needs Assistance Bed Mobility: Supine to Sit, Sit to Supine     Supine to sit: Supervision Sit to supine: Supervision   General bed mobility comments: No physical assist    Transfers Overall transfer level: Needs assistance Equipment used: Rolling walker (2 wheels) Transfers: Sit to/from Stand Sit to Stand: Contact guard assist           General transfer comment: Steady assist with min cues for use of UEs to self assist    Ambulation/Gait Ambulation/Gait assistance: Min assist, Contact guard assist Gait Distance (Feet): 125 Feet (twice with seated rest break between) Assistive device: Rolling walker (2 wheels) Gait Pattern/deviations: Decreased stride length, Step-through pattern, Shuffle Gait velocity: decreased     General Gait Details: cues for posture, position from RW and to decrease pace  for safety and energy conservation   Stairs             Wheelchair Mobility     Tilt Bed    Modified Rankin (Stroke Patients Only)       Balance Overall balance assessment: Needs assistance Sitting-balance support: No upper extremity supported, Feet supported Sitting balance-Leahy Scale: Good     Standing balance support: During functional activity, Single extremity supported Standing balance-Leahy Scale: Poor Standing balance comment: static fair, dynamic poor (reliant on UE support)                            Cognition Arousal: Alert Behavior During Therapy: WFL for tasks assessed/performed Overall Cognitive Status: Within Functional Limits for tasks assessed                                 General Comments: oriented and appropraite throughout session        Exercises      General Comments General comments (skin integrity, edema, etc.): On 2L O2      Pertinent Vitals/Pain Pain Assessment Pain Assessment: No/denies pain    Home Living                          Prior Function            PT Goals (current goals can now be found in the care plan section) Acute Rehab PT Goals Patient Stated Goal: return home PT  Goal Formulation: With patient Time For Goal Achievement: 01/14/23 Potential to Achieve Goals: Good Progress towards PT goals: Progressing toward goals    Frequency    Min 1X/week      PT Plan      Co-evaluation              AM-PAC PT "6 Clicks" Mobility   Outcome Measure  Help needed turning from your back to your side while in a flat bed without using bedrails?: A Little Help needed moving from lying on your back to sitting on the side of a flat bed without using bedrails?: A Little Help needed moving to and from a bed to a chair (including a wheelchair)?: A Little Help needed standing up from a chair using your arms (e.g., wheelchair or bedside chair)?: A Little Help needed to walk in  hospital room?: A Little Help needed climbing 3-5 steps with a railing? : A Lot 6 Click Score: 17    End of Session Equipment Utilized During Treatment: Gait belt;Oxygen Activity Tolerance: Patient tolerated treatment well Patient left: in bed;with call bell/phone within reach;with bed alarm set Nurse Communication: Mobility status PT Visit Diagnosis: Other abnormalities of gait and mobility (R26.89);Muscle weakness (generalized) (M62.81)     Time: 1116-1140 PT Time Calculation (min) (ACUTE ONLY): 24 min  Charges:    $Gait Training: 23-37 mins PT General Charges $$ ACUTE PT VISIT: 1 Visit                     Mauro Kaufmann PT Acute Rehabilitation Services Pager [phone] Office [phone]    [facility] 01/02/2023, 12:39 PM

## 2023-01-02 NOTE — Progress Notes (Signed)
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  Again noted to be with urine retention and straight cath yielded 800 cc of urine.  Admission was requested for worsening of her renal function and weakness.  Her mental status was normal on admission. Patient has been voiding well not needing Foley catheter-placed on Flomax Mentating well alert awake oriented x 3 creatinine improving on IV fluids,, seen by PT OT and planning for discharge back to skilled nursing facility encourage oral hydration      Subjective: Patient seen and examined Patient is resting comfortably  She is bored wondering when she can return back to facility Inquiring about PT OT   Assessment and Plan: Principal Problem:   AKI (acute kidney injury) (HCC) Active Problems:   History of DVT (deep vein thrombosis)  AKI on CKD stage IIIb b/l creat Around 1.2-1.5: Creatinine remains elevated but slightly better today 1.6 and monitoring if this is her baseline as previously noticed she was up to 1.8 to 2 L, continue IV fluids to 30 cc/hr and ho Lasix for additional day.  Encourage oral intake monitor renal function closely avoid nephrotoxic medicationld  Recent Labs    12/15/22 0412 12/16/22 0408 12/25/22 2137 12/26/22 0456 12/27/22 0425 12/28/22 0345 12/29/22 0828 12/30/22 1015  12/31/22 0606 01/01/23 0832 01/02/23 1006  BUN 21 18 19 17 15   --  18 19 21 19 18   CREATININE 1.86* 1.52* 1.24* 1.32* 1.38*  --  1.74* 1.89* 1.60* 1.76* 1.66*  CO2 24 21* 20* 21* 22  --  23 28 27 28 25   K 3.3* 3.4* 2.8* 3.4* 3.0*   < > 3.7 3.4* 3.9 3.6 3.5   < > = values in this interval not displayed.    Urine retention: Has resolved continue Flomax and monitor voiding.    Generalized weakness/confusion at the facility: Likely multifactorial deconditioning debility.  She is alert awake and oriented.Robaxin discontinued all psychotropic medication pain medication continue PT OT and planning for discharge back to facility once available   History of DVT in march: Continue Eliquis-apparently discontinued at SNF - but will need to continue at this lower dose given her malignancy at risk of DVT, she will need to follow-up with oncology for further recommendation.  History of breast cancer: Resumed Arimidex. HER Ibrance previously placed on hold by oncologist   Chronic HFpEF: Stable.home lasix on hold for now.  Monitor fluid status currently no signs of fluid overload.  DVT prophylaxis: apixaban (ELIQUIS) tablet 2.5 mg Start: 01/01/23 1000 Code Status:   Code Status: Full Code Family Communication: plan of care discussed with patient at bedside. Patient status is:  admitted as observation changed to inpatient as she continues to require IV fluids for AKI  Level of care: Med-Surg  Dispo: The patient is from: SNF            Anticipated disposition : Discharge back to SNF once available, TOC notified.  Objective: Vitals last 24 hrs: Vitals:   01/01/23 1415 01/01/23 1944 01/02/23 0512 01/02/23 0830  BP:  (!) 125/52 (!) 98/52 (!) 106/57  Pulse: (!) 112 (!) 102  (!) 119  Resp:  18 18 18   Temp:  98.3 F (36.8 C) 98.5 F (36.9 C) (!) 97.5 F (36.4 C)  TempSrc:  Oral Oral Oral  SpO2: 99% 98% 96% 95%  Weight:      Height:       Weight change:   Physical Examination: General  exam: alert awake, oriented at baseline, older than stated age HEENT:Oral mucosa moist, Ear/Nose WNL grossly Respiratory system: Bilaterally clear BS,no use of accessory muscle Cardiovascular system: S1 & S2 +, No JVD. Gastrointestinal system: Abdomen soft,NT,ND, BS+ Nervous System: Alert, awake, moving all extremities,and following commands. Extremities: LE edema neg,distal peripheral pulses palpable and warm.  Skin: No rashes,no icterus. MSK: Normal muscle bulk,tone, power   Medications reviewed:  Scheduled Meds:  anastrozole  1 mg Oral Daily   apixaban  2.5 mg Oral BID   pantoprazole  40 mg Oral Daily   sodium chloride flush  3 mL Intravenous Q12H   tamsulosin  0.4 mg Oral QPC supper   Continuous Infusions:  lactated ringers 75 mL/hr at 01/02/23 5093    Diet Order             Diet regular Room service appropriate? Yes; Fluid consistency: Thin  Diet effective now                   Intake/Output Summary (Last 24 hours) at 01/02/2023 1149 Last data filed at 01/02/2023 0816 Gross per 24 hour  Intake 2576.18 ml  Output 1101 ml  Net 1475.18 ml   Net IO Since Admission: 621.88 mL [01/02/23 1149]  Wt Readings from Last 3 Encounters:  12/30/22 59.2 kg  12/29/22 62.7 kg  12/06/22 65 kg     Unresulted Labs (From admission, onward)     Start     Ordered   01/03/23 0500  Basic metabolic panel  Tomorrow morning,   R        01/02/23 0852           Data Reviewed: I have personally reviewed following labs and imaging studies CBC: Recent Labs  Lab 12/30/22 1015  WBC 6.6  NEUTROABS 4.4  HGB 9.0*  HCT 27.9*  MCV 104.1*  PLT 427*   Basic Metabolic Panel:  Recent Labs  Lab 12/26/22 1654 12/27/22 0425 12/27/22 0425 12/28/22 0345 12/29/22 0828 12/30/22 1015 12/31/22 0606 01/01/23 0832 01/02/23 1006  NA  --  136   < >  --  135 137 133* 137 136  K  --  3.0*   < > 4.2 3.7 3.4* 3.9 3.6 3.5  CL  --  103   < >  --  99 98 96* 99 100  CO2  --  22   < >  --  23  28 27 28 25   GLUCOSE  --  111*   < >  --  110* 109* 112* 113* 146*  BUN  --  15   < >  --  18 19 21 19 18   CREATININE  --  1.38*   < >  --  1.74* 1.89* 1.60* 1.76* 1.66*  CALCIUM  --  7.0*   < >  --  8.2* 9.1 8.8* 9.1 8.7*  MG 1.5* 1.9  --  1.6* 2.6* 2.0  --   --   --    < > =  values in this interval not displayed.   GFR: Estimated Creatinine Clearance: 21.6 mL/min (A) (by C-G formula based on SCr of 1.66 mg/dL (H)). Liver Function Tests:  Recent Labs  Lab 12/30/22 1015  AST 20  ALT 13  ALKPHOS 69  BILITOT 0.4  PROT 7.7  ALBUMIN 3.4*  Sepsis Labs: Recent Labs  Lab 12/30/22 1125  LATICACIDVEN 0.9    Recent Results (from the past 240 hour(s))  Culture, blood (Routine X 2) w Reflex to ID Panel     Status: None (Preliminary result)   Collection Time: 12/30/22 11:13 AM   Specimen: BLOOD  Result Value Ref Range Status   Specimen Description   Final    BLOOD RIGHT ANTECUBITAL Performed at [facility], [address], [place], [place] [address]    Special Requests   Final    BOTTLES DRAWN AEROBIC AND ANAEROBIC Blood Culture adequate volume Performed at [facility], [address] [place], [place] [address]    Culture   Final    NO GROWTH 3 DAYS Performed at [facility], [address], [place], [place] [address]    Report Status PENDING  Incomplete  Culture, blood (Routine X 2) w Reflex to ID Panel     Status: None (Preliminary result)   Collection Time: 12/30/22 11:15 AM   Specimen: BLOOD RIGHT ARM  Result Value Ref Range Status   Specimen Description   Final    BLOOD RIGHT ARM Performed at [facility], [address] [place], [place] [address]    Special Requests   Final    BOTTLES DRAWN AEROBIC AND ANAEROBIC Blood Culture adequate volume Performed at [facility], [address], [place], [place] [address]    Culture   Final    NO GROWTH 3 DAYS Performed at [facility], [address] [place], [place] [address]    Report Status PENDING  Incomplete  MRSA Next Gen by PCR, Nasal     Status: None   Collection Time: 12/30/22  6:00 PM   Specimen: Nasal Mucosa; Nasal Swab  Result Value Ref Range Status   MRSA by PCR Next Gen NOT DETECTED NOT DETECTED Final    Comment: (NOTE) The GeneXpert MRSA Assay (FDA approved for NASAL specimens only), is one component of a comprehensive MRSA colonization surveillance program. It is not intended to diagnose MRSA infection nor to guide or monitor treatment for MRSA infections. Test performance is not FDA approved in patients less than 62 years old. Performed at [facility], [address], [place], [place] [address]     Antimicrobials: Anti-infectives (From admission, onward)    None      Culture/Microbiology    Component Value Date/Time   SDES  12/30/2022 1115    BLOOD RIGHT ARM Performed at [facility], [address], [place], [place] [address]    SPECREQUEST  12/30/2022 1115    BOTTLES DRAWN AEROBIC AND ANAEROBIC Blood Culture adequate volume Performed at [facility], [address] [place], [place] [address]    CULT  12/30/2022 1115    NO GROWTH 3 DAYS Performed at [facility], [address] [place], [place] [address]    REPTSTATUS PENDING 12/30/2022 1115   Radiology Studies: No results found.   LOS: 2 days   Total time spent in review of labs and imaging, patient evaluation, formulation of plan, documentation and communication with  family: 35 minutes  Lanae Boast, MD [facility]  01/02/2023, 11:49 AM

## 2023-01-03 DIAGNOSIS — J9601 Acute respiratory failure with hypoxia: Secondary | ICD-10-CM | POA: Diagnosis not present

## 2023-01-03 DIAGNOSIS — D630 Anemia in neoplastic disease: Secondary | ICD-10-CM | POA: Diagnosis not present

## 2023-01-03 DIAGNOSIS — Z923 Personal history of irradiation: Secondary | ICD-10-CM | POA: Diagnosis not present

## 2023-01-03 DIAGNOSIS — F419 Anxiety disorder, unspecified: Secondary | ICD-10-CM | POA: Diagnosis present

## 2023-01-03 DIAGNOSIS — R531 Weakness: Secondary | ICD-10-CM | POA: Diagnosis not present

## 2023-01-03 DIAGNOSIS — I10 Essential (primary) hypertension: Secondary | ICD-10-CM | POA: Diagnosis not present

## 2023-01-03 DIAGNOSIS — K529 Noninfective gastroenteritis and colitis, unspecified: Secondary | ICD-10-CM | POA: Diagnosis present

## 2023-01-03 DIAGNOSIS — Z8249 Family history of ischemic heart disease and other diseases of the circulatory system: Secondary | ICD-10-CM | POA: Diagnosis not present

## 2023-01-03 DIAGNOSIS — D631 Anemia in chronic kidney disease: Secondary | ICD-10-CM | POA: Diagnosis present

## 2023-01-03 DIAGNOSIS — E860 Dehydration: Secondary | ICD-10-CM | POA: Diagnosis present

## 2023-01-03 DIAGNOSIS — N1831 Chronic kidney disease, stage 3a: Secondary | ICD-10-CM | POA: Diagnosis not present

## 2023-01-03 DIAGNOSIS — I5033 Acute on chronic diastolic (congestive) heart failure: Secondary | ICD-10-CM | POA: Diagnosis not present

## 2023-01-03 DIAGNOSIS — C50911 Malignant neoplasm of unspecified site of right female breast: Secondary | ICD-10-CM | POA: Diagnosis not present

## 2023-01-03 DIAGNOSIS — I951 Orthostatic hypotension: Secondary | ICD-10-CM | POA: Diagnosis not present

## 2023-01-03 DIAGNOSIS — R55 Syncope and collapse: Secondary | ICD-10-CM | POA: Diagnosis not present

## 2023-01-03 DIAGNOSIS — Z79811 Long term (current) use of aromatase inhibitors: Secondary | ICD-10-CM | POA: Diagnosis not present

## 2023-01-03 DIAGNOSIS — Z7901 Long term (current) use of anticoagulants: Secondary | ICD-10-CM | POA: Diagnosis not present

## 2023-01-03 DIAGNOSIS — K219 Gastro-esophageal reflux disease without esophagitis: Secondary | ICD-10-CM | POA: Diagnosis present

## 2023-01-03 DIAGNOSIS — R2689 Other abnormalities of gait and mobility: Secondary | ICD-10-CM | POA: Diagnosis not present

## 2023-01-03 DIAGNOSIS — I82409 Acute embolism and thrombosis of unspecified deep veins of unspecified lower extremity: Secondary | ICD-10-CM | POA: Diagnosis not present

## 2023-01-03 DIAGNOSIS — R278 Other lack of coordination: Secondary | ICD-10-CM | POA: Diagnosis not present

## 2023-01-03 DIAGNOSIS — Z7951 Long term (current) use of inhaled steroids: Secondary | ICD-10-CM | POA: Diagnosis not present

## 2023-01-03 DIAGNOSIS — N390 Urinary tract infection, site not specified: Secondary | ICD-10-CM | POA: Diagnosis present

## 2023-01-03 DIAGNOSIS — I5032 Chronic diastolic (congestive) heart failure: Secondary | ICD-10-CM | POA: Diagnosis present

## 2023-01-03 DIAGNOSIS — Z7401 Bed confinement status: Secondary | ICD-10-CM | POA: Diagnosis not present

## 2023-01-03 DIAGNOSIS — I499 Cardiac arrhythmia, unspecified: Secondary | ICD-10-CM | POA: Diagnosis not present

## 2023-01-03 DIAGNOSIS — D649 Anemia, unspecified: Secondary | ICD-10-CM | POA: Diagnosis not present

## 2023-01-03 DIAGNOSIS — Z86718 Personal history of other venous thrombosis and embolism: Secondary | ICD-10-CM | POA: Diagnosis not present

## 2023-01-03 DIAGNOSIS — E876 Hypokalemia: Secondary | ICD-10-CM | POA: Diagnosis not present

## 2023-01-03 DIAGNOSIS — R42 Dizziness and giddiness: Secondary | ICD-10-CM | POA: Diagnosis not present

## 2023-01-03 DIAGNOSIS — N179 Acute kidney failure, unspecified: Secondary | ICD-10-CM | POA: Diagnosis not present

## 2023-01-03 DIAGNOSIS — R0600 Dyspnea, unspecified: Secondary | ICD-10-CM | POA: Diagnosis not present

## 2023-01-03 DIAGNOSIS — E785 Hyperlipidemia, unspecified: Secondary | ICD-10-CM | POA: Diagnosis present

## 2023-01-03 DIAGNOSIS — N1832 Chronic kidney disease, stage 3b: Secondary | ICD-10-CM | POA: Diagnosis not present

## 2023-01-03 DIAGNOSIS — M6281 Muscle weakness (generalized): Secondary | ICD-10-CM | POA: Diagnosis not present

## 2023-01-03 DIAGNOSIS — I130 Hypertensive heart and chronic kidney disease with heart failure and stage 1 through stage 4 chronic kidney disease, or unspecified chronic kidney disease: Secondary | ICD-10-CM | POA: Diagnosis present

## 2023-01-03 DIAGNOSIS — Z87891 Personal history of nicotine dependence: Secondary | ICD-10-CM | POA: Diagnosis not present

## 2023-01-03 DIAGNOSIS — E8721 Acute metabolic acidosis: Secondary | ICD-10-CM | POA: Diagnosis not present

## 2023-01-03 LAB — BASIC METABOLIC PANEL
Anion gap: 9 (ref 5–15)
BUN: 17 mg/dL (ref 8–23)
CO2: 26 mmol/L (ref 22–32)
Calcium: 8.5 mg/dL — ABNORMAL LOW (ref 8.9–10.3)
Chloride: 103 mmol/L (ref 98–111)
Creatinine, Ser: 1.44 mg/dL — ABNORMAL HIGH (ref 0.44–1.00)
GFR, Estimated: 36 mL/min — ABNORMAL LOW (ref >60)
Glucose, Bld: 116 mg/dL — ABNORMAL HIGH (ref 70–99)
Potassium: 3.5 mmol/L (ref 3.5–5.1)
Sodium: 138 mmol/L (ref 135–145)

## 2023-01-03 MED ORDER — APIXABAN 2.5 MG PO TABS: 2.5000 mg | ORAL_TABLET | Freq: Two times a day (BID) | ORAL | Status: DC

## 2023-01-03 NOTE — TOC Transition Note (Addendum)
Transition of Care [facility]) - CM/SW Discharge Note   Patient Details  Name: Stacey [name] MRN: [id number] Date of Birth: [birth year]  [facility] CM/SW Contact:  Darleene Cleaver, LCSW Phone Number: 01/03/2023, 11:50 AM   Clinical Narrative:     Patient will be discharging back to [facility] for short term rehab.  Patient to be d/c'ed today to [facility] room 603B. Patient and family agreeable to plans will transport via ems RN to call report [phone].  Left voice mail for patient's daughter [name] that she is discharging today.  EMS called at 12:20pm, [phone]    Final next level of care: Skilled Nursing Facility Barriers to Discharge: Barriers Resolved   Patient Goals and CMS Choice CMS Medicare.gov Compare Post Acute Care list provided to:: Patient Represenative (must comment) Choice offered to / list presented to : Adult Children  Discharge Placement  [facility] SN[facility].   Existing PASRR number confirmed : 01/03/23          Patient chooses bed at: [facility] Patient to be transferred to facility by: [facility] Name of family member notified: Left a secure voice mail on daughter [name]'s cell phone, [phone] Patient and family notified of of transfer: 01/03/23  Discharge Plan and Services Additional resources added to the After Visit Summary for   In-house Referral: Clinical Social Work Discharge Planning Services: NA Post Acute Care Choice: Resumption of Paediatric nurse, Skilled Nursing Facility          DME Arranged: N/A DME Agency: NA                  Social Determinants of Health (SDOH) Interventions SDOH Screenings   Food Insecurity: No Food Insecurity (12/30/2022)  Housing: Patient Declined (12/30/2022)  Transportation Needs: No Transportation Needs (12/30/2022)  Utilities: Not At Risk (12/30/2022)  Alcohol Screen: Low Risk  (11/07/2022)  Depression (PHQ2-9): Low Risk  (11/10/2022)  Financial Resource Strain: Low Risk  (11/07/2022)   Physical Activity: Inactive (11/07/2022)  Social Connections: Socially Isolated (11/07/2022)  Stress: No Stress Concern Present (11/07/2022)  Tobacco Use: Medium Risk (12/30/2022)  Health Literacy: Adequate Health Literacy (11/10/2022)     Readmission Risk Interventions    12/28/2022    3:55 PM 12/27/2022    2:25 PM 11/19/2022    1:54 PM  Readmission Risk Prevention Plan  Transportation Screening  Complete Complete  PCP or Specialist Appt within 5-7 Days   Complete  Home Care Screening   Complete  Medication Review (RN CM)   Complete  Medication Review (RN Care Manager) Complete    PCP or Specialist appointment within 3-5 days of discharge Complete Complete   HRI or Home Care Consult Complete    SW Recovery Care/Counseling Consult Complete Complete   Palliative Care Screening Not Applicable    Skilled Nursing Facility Complete Complete

## 2023-01-03 NOTE — [place] FL2 (Signed)
[place] MEDICAID FL2 LEVEL OF CARE FORM     IDENTIFICATION  Patient Name: Stacey [name] Birthdate: 1940/07/17 Sex: female Admission Date (Current Location): 12/30/2022  [facility] and [place] Number:  Producer, television/film/video and Address:  [facility],  [address], [place] [address]      Provider Number: [id number]  Attending Physician Name and Address:  Lanae Boast, MD  Relative Name and Phone Number:  Smith,[name] Daughter [phone]  [phone]    Current Level of Care: Hospital Recommended Level of Care: Skilled Nursing Facility Prior Approval Number:    Date Approved/Denied:   PASRR Number: [id number]  Discharge Plan: SNF    Current Diagnoses: Patient Active Problem List   Diagnosis Date Noted   AKI (acute kidney injury) (HCC) 01/01/2023   AMS (altered mental status) 12/31/2022   Altered mental status 12/30/2022   Acute on chronic diastolic heart failure (HCC) 12/26/2022   Hypokalemia 12/26/2022   Acute on chronic hypoxic respiratory failure (HCC) 12/05/2022   Metabolic acidosis 12/04/2022   Hypocalcemia 12/04/2022   Fatigue 12/04/2022   Pneumonia 12/04/2022   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   DVT (deep venous thrombosis) (HCC) 12/04/2022   Diarrhea 12/04/2022   Sepsis (HCC) 11/17/2022   History of DVT (deep vein thrombosis) 11/17/2022   Current chronic use of systemic steroids 11/17/2022   Tachypnea 11/17/2022   Pulmonary nodule 11/17/2022   Acute deep vein thrombosis (DVT) of femoral vein of right lower extremity (HCC) 04/13/2022   Acute upper respiratory infection 05/16/2021   Nasal dryness 02/04/2021   Chronic sinusitis 11/25/2020   Allergic rhinitis 07/08/2020   Hypomagnesemia 06/10/2020   Goals of care, counseling/discussion 06/21/2019   Family history of breast cancer    Family history of pancreatic cancer    Family history of uterine cancer    Family history of lymphoma    Family history of lung  cancer    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HCC) 06/02/2019   Recurrent cancer of left breast (HCC) 05/15/2019   Pes anserine bursitis 03/31/2019   HTN (hypertension) 02/10/2019   Hyperglycemia 06/21/2018   Left carpal tunnel syndrome 02/22/2018   Rotator cuff arthropathy of left shoulder 09/20/2017   Arthritis of hand 08/23/2017   Pain in right hand 08/23/2017   Cervical radiculitis 04/09/2017   Left shoulder pain 04/09/2017   Dyspnea on exertion 05/14/2016   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   Lymphedema of left arm 01/14/2016   Left arm swelling 12/25/2015   SVT (supraventricular tachycardia) (HCC)    Left-sided carotid artery disease (HCC) 03/08/2015   Dizziness 01/24/2015   Urinary frequency 01/13/2015   Dizziness and giddiness 01/09/2015   Gallstones 02/21/2013   Malignant neoplasm of upper-inner quadrant of left breast in female, estrogen receptor positive (HCC) 11/28/2012   Muscle cramping 09/12/2012   Right hip pain 09/12/2012   Lower back pain 09/12/2012   COPD GOLD I     Hx of radiation therapy    Right shoulder pain 09/14/2011   Preventative health care 07/29/2010   Skin lesion of left leg 07/29/2010   Paresthesia 07/29/2010   GERD 03/28/2010   Constipation 03/28/2010   Osteoarthrosis, 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   Anxiety state 05/03/2009   Hyperlipidemia 06/14/2007   FATIGUE 06/14/2007   Anemia 12/17/2006   Diverticulosis of colon 12/17/2006   Cough 12/17/2006   IRRITABLE BOWEL SYNDROME, HX OF 12/17/2006    Orientation RESPIRATION BLADDER Height & Weight  Self, Time, Situation, Place  Normal Continent Weight: 130 lb 8.2 oz (59.2 kg) Height:  5\' 3"  (160 cm)  BEHAVIORAL SYMPTOMS/MOOD NEUROLOGICAL BOWEL NUTRITION STATUS      Continent Diet  AMBULATORY STATUS COMMUNICATION OF NEEDS Skin   Limited Assist Verbally Normal                       Personal Care Assistance Level of Assistance  Bathing, Feeding, Dressing Bathing  Assistance: Limited assistance Feeding assistance: Independent Dressing Assistance: Limited assistance     Functional Limitations Info  Sight, Hearing, Speech Sight Info: Adequate Hearing Info: Adequate Speech Info: Adequate    SPECIAL CARE FACTORS FREQUENCY  PT (By licensed PT), OT (By licensed OT)     PT Frequency: Minimum 5x a week OT Frequency: Minimum 5x a week            Contractures Contractures Info: Not present    Additional Factors Info  Code Status, Allergies Code Status Info: Full Code Allergies Info: Other  Tape  Aminoglycosides  Bacitracin  Bee Venom  Cephalexin  Clindamycin/lincomycin  Codeine           Current Medications (01/03/2023):  This is the current hospital active medication list Current Facility-Administered Medications  Medication Dose Route Frequency Provider Last Rate Last Admin   0.9 %  sodium chloride infusion   Intravenous Continuous Kc, Ramesh, MD 30 mL/hr at 01/02/23 1618 Infusion Verify at 01/02/23 1618   acetaminophen (TYLENOL) tablet 650 mg  650 mg Oral Q6H PRN Russella Dar, NP       Or   acetaminophen (TYLENOL) suppository 650 mg  650 mg Rectal Q6H PRN Russella Dar, NP       anastrozole (ARIMIDEX) tablet 1 mg  1 mg Oral Daily Kc, Ramesh, MD   1 mg at 01/03/23 0915   apixaban (ELIQUIS) tablet 2.5 mg  2.5 mg Oral BID Kc, Dayna Barker, MD   2.5 mg at 01/03/23 0915   budesonide (PULMICORT) nebulizer solution 0.25 mg  0.25 mg Nebulization BID Kc, Dayna Barker, MD   0.25 mg at 01/03/23 0841   guaiFENesin-dextromethorphan (ROBITUSSIN DM) 100-10 MG/5ML syrup 5 mL  5 mL Oral Q8H PRN Kc, Dayna Barker, MD       loratadine (CLARITIN) tablet 10 mg  10 mg Oral Q48H Kc, Ramesh, MD   10 mg at 01/03/23 0915   ondansetron (ZOFRAN) tablet 4 mg  4 mg Oral Q6H PRN Russella Dar, NP       Or   ondansetron (ZOFRAN) injection 4 mg  4 mg Intravenous Q6H PRN Russella Dar, NP       pantoprazole (PROTONIX) EC tablet 40 mg  40 mg Oral Daily Russella Dar, NP    40 mg at 01/03/23 0915   sodium chloride flush (NS) 0.9 % injection 3 mL  3 mL Intravenous Q12H Russella Dar, NP   3 mL at 01/03/23 0915   tamsulosin (FLOMAX) capsule 0.4 mg  0.4 mg Oral QPC supper Kc, Dayna Barker, MD   0.4 mg at 01/02/23 1711   triamcinolone (NASACORT) nasal inhaler 2 spray  2 spray Nasal Daily Lanae Boast, MD   2 spray at 01/02/23 2146     Discharge Medications: Please see discharge summary for a list of discharge medications.  Relevant Imaging Results:  Relevant Lab Results:   Additional Information SSN [id number]  Darleene Cleaver, LCSW

## 2023-01-03 NOTE — Discharge Summary (Signed)
Physician Discharge Summary  SADI WAYNER GNF:[id number] DOB: [birth year] DOA: 12/30/2022  PCP: [name], MD  Admit date: 12/30/2022 Discharge date: 01/03/2023 Recommendations for Outpatient Follow-up:  Follow up with PCP in 1 weeks-call for appointment Please obtain BMP/CBC in one week  Discharge Dispo: snf Discharge Condition: Stable Code Status:   Code Status: Full Code Diet recommendation:  Diet Order             Diet regular Room service appropriate? 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  Again noted to be with urine retention and straight cath yielded 800 cc of urine.  Admission was requested for worsening of her renal function and weakness.  Her mental status was normal on admission. Patient has been voiding well not needing Foley catheter-placed on Flomax Mentating well alert awake oriented x 3 creatinine improving on IV fluids,, seen by PT OT and planning for discharge back to skilled nursing facility encourage oral hydration.  At this time creatinine has improved to 1.4 and stable to return back to facility   Discharge Diagnoses:  Principal Problem:   AKI (acute kidney injury) (HCC) Active Problems:   History of DVT (deep vein thrombosis)  AKI on CKD stage IIIb b/l creat Around 1.2-1.5: Creatinine remains elevated but slightly better today 1.6 and monitoring  if this is her baseline as previously noticed she was up to 1.8 to 2s. AKI has improved 1.4,she is stable for discharge encouraged oral hydration.F/U BMP in 1 wk.   Urine retention: Has resolved continue Flomax and monitor voiding.    Generalized weakness/confusion at the facility: Likely multifactorial deconditioning debility.  She is alert awake and oriented.Robaxin discontinued all psychotropic medication pain medication continue PT OT and planning for discharge back to facility once available   History of DVT in march: Continue Eliquis-apparently discontinued at SNF - but will need to continue at this lower dose given her malignancy at risk of DVT, she will need to follow-up with oncology for further recommendation.  History of breast cancer: Resumed Arimidex. HER Ibrance previously placed on hold by oncologist   Chronic HFpEF: Stable.home lasix on hold for now.  Monitor fluid status currently no signs of fluid overload.  Consults: None  Subjective:  AAOX3, no nausea vomiting Resting well.  Discharge Exam: Vitals:   01/03/23 0548 01/03/23 0842  BP: (!) 115/48   Pulse: 98   Resp: 16   Temp: 98.4 F (36.9 C)   SpO2: 95% 98%   General: Pt is alert, awake, not in acute distress Cardiovascular: RRR, S1/S2 +, no rubs, no gallops Respiratory: CTA bilaterally, no wheezing, no rhonchi Abdominal: Soft, NT, ND, bowel sounds + Extremities: no edema, no cyanosis  Discharge Instructions  Discharge Instructions     Discharge instructions   Complete by: As directed    Please call call MD or return to ER for similar or worsening recurring problem that  brought you to hospital or if any fever,nausea/vomiting,abdominal pain, uncontrolled pain, chest pain,  shortness of breath or any other alarming symptoms.  Please follow-up your doctor as instructed in a week time and call the office for appointment.  Please avoid alcohol, smoking, or any other illicit substance and maintain  healthy habits including taking your regular medications as prescribed.  You were cared for by a hospitalist during your hospital stay. If you have any questions about your discharge medications or the care you received while you were in the hospital after you are discharged, you can call the unit and ask to speak with the hospitalist on call if the hospitalist that took care of you is not available.  Once you are discharged, your primary care physician will handle any further medical issues. Please note that NO REFILLS for any discharge medications will be authorized once you are discharged, as it is imperative that you return to your primary care physician (or establish a relationship with a primary care physician if you do not have one) for your aftercare needs so that they can reassess your need for medications and monitor your lab values   Increase activity slowly   Complete by: As directed       Allergies as of 01/03/2023       Reactions   Other Nausea Only, Other (See Comments)   A lot of antibiotics cause extreme nausea   Tape Other (See Comments)   PAPER TAPE IS MUCH PREFERRED   Aminoglycosides Other (See Comments)   Unknown reaction - pt is not sure where this entry came from   Bacitracin Other (See Comments)   Reaction not recalled   Bee Venom Swelling, Other (See Comments)   Severe body swelling   Cephalexin Nausea Only   Clindamycin/lincomycin Diarrhea, Nausea And Vomiting   Codeine Nausea And Vomiting, Other (See Comments)   Pt can take codeine cough syrup        Medication List     STOP taking these medications    aspirin EC 81 MG tablet   zolpidem 10 MG tablet Commonly known as: AMBIEN       TAKE these medications    anastrozole 1 MG tablet Commonly known as: ARIMIDEX TAKE 1 TABLET(1 MG) BY MOUTH DAILY   apixaban 2.5 MG Tabs tablet Commonly known as: ELIQUIS Take 1 tablet (2.5 mg total) by mouth 2 (two) times daily. What changed:  medication  strength how much to take   budesonide 0.25 MG/2ML nebulizer solution Commonly known as: PULMICORT Take 2 mLs (0.25 mg total) by nebulization 2 (two) times daily for 5 days.   CALCIUM CARBONATE-VITAMIN D PO Take 1 tablet by mouth 2 (two) times daily.   fulvestrant 250 MG/5ML injection Commonly known as: FASLODEX Inject 250 mg into the muscle every 28 (twenty-eight) days.   furosemide 40 MG tablet Commonly known as: LASIX Take 1 tablet (40 mg total) by mouth daily as needed for 3 days for fluid or edema (dyspnea, weight gain of 3 lbs in 24 hours), THEN 1 tablet (40 mg total) daily. Start taking on: December 29, 2022   loratadine 10 MG tablet Commonly known as: CLARITIN Take 10 mg by mouth every morning.   omeprazole 20 MG capsule Commonly known as: PRILOSEC Take 1 capsule by mouth daily   palbociclib 100 MG tablet Commonly known as: Ibrance HOLD PER ONCOLOGIST. FOLLOW-UP WITH ONCOLOGIST PRIOR TO RESTARTING   prochlorperazine 10 MG tablet Commonly known as: COMPAZINE Take 1 tablet (  10 mg total) by mouth every 6 (six) hours as needed for nausea or vomiting.   triamcinolone 55 MCG/ACT Aero nasal inhaler Commonly known as: NASACORT Place 2 sprays into the nose daily. 2 sprays each nostril at night before bedtime What changed:  when to take this additional instructions        Follow-up Information     [name], MD Follow up.   Specialties: Internal Medicine, Radiology Contact information: [address] [place] [address] [phone]         Rachel Moulds, MD Follow up in 3 week(s).   Specialty: Hematology and Oncology Contact information: [address] [place] [address] [phone]                Allergies  Allergen Reactions   Other Nausea Only and Other (See Comments)    A lot of antibiotics cause extreme nausea   Tape Other (See Comments)    PAPER TAPE IS MUCH PREFERRED   Aminoglycosides Other (See Comments)    Unknown  reaction - pt is not sure where this entry came from   Bacitracin Other (See Comments)    Reaction not recalled   Bee Venom Swelling and Other (See Comments)    Severe body swelling   Cephalexin Nausea Only   Clindamycin/Lincomycin Diarrhea and Nausea And Vomiting   Codeine Nausea And Vomiting and Other (See Comments)    Pt can take codeine cough syrup    The results of significant diagnostics from this hospitalization (including imaging, microbiology, ancillary and laboratory) are listed below for reference.    Microbiology: Recent Results (from the past 240 hour(s))  Culture, blood (Routine X 2) w Reflex to ID Panel     Status: None (Preliminary result)   Collection Time: 12/30/22 11:13 AM   Specimen: BLOOD  Result Value Ref Range Status   Specimen Description   Final    BLOOD RIGHT ANTECUBITAL Performed at [facility], [address], [place], [place] [address]    Special Requests   Final    BOTTLES DRAWN AEROBIC AND ANAEROBIC Blood Culture adequate volume Performed at [facility], [address] [place], [place] [address]    Culture   Final    NO GROWTH 4 DAYS Performed at [facility], [address], [place], [place] [address]    Report Status PENDING  Incomplete  Culture, blood (Routine X 2) w Reflex to ID Panel     Status: None (Preliminary result)   Collection Time: 12/30/22 11:15 AM   Specimen: BLOOD RIGHT ARM  Result Value Ref Range Status   Specimen Description   Final    BLOOD RIGHT ARM Performed at [facility], [address] [place], [place] [address]    Special Requests   Final    BOTTLES DRAWN AEROBIC AND ANAEROBIC Blood Culture adequate volume Performed at [facility], [address] [place], [place] [address]    Culture   Final    NO GROWTH 4 DAYS Performed at [facility], [address], [place], [place] [address]    Report Status PENDING  Incomplete  MRSA Next Gen  by PCR, Nasal     Status: None   Collection Time: 12/30/22  6:00 PM   Specimen: Nasal Mucosa; Nasal Swab  Result Value Ref Range Status   MRSA by PCR Next Gen NOT DETECTED NOT DETECTED Final    Comment: (NOTE) The GeneXpert  MRSA Assay (FDA approved for NASAL specimens only), is one component of a comprehensive MRSA colonization surveillance program. It is not intended to diagnose MRSA infection nor to guide or monitor treatment for MRSA infections. Test performance is not FDA approved in patients less than 23 years old. Performed at [facility], [address], [place], [place] [address]     Procedures/Studies: US RENAL  Result Date: 12/30/2022 CLINICAL DATA:  Urinary retention EXAM: RENAL / URINARY TRACT ULTRASOUND COMPLETE COMPARISON:  CT 12/13/2022, ultrasound 12/08/2022 FINDINGS: Right Kidney: Renal measurements: 8.1 by 3.4 x 4.5 cm = volume: 65.9 mL. Echogenicity within normal limits. No mass or hydronephrosis visualized. Left Kidney: Renal measurements: 8.3 x 4 x 5.2 cm = volume: 90.4 mL. Echogenicity within normal limits. No mass or hydronephrosis visualized. Bladder: Appears normal for degree of bladder distention. Other: Liver appears slightly echogenic IMPRESSION: 1. Negative for hydronephrosis. 2. Liver appears slightly echogenic, question mild steatosis. Electronically Signed   By: Jasmine Pang M.D.   On: 12/30/2022 16:23   DG Chest Port 1 View  Result Date: 12/30/2022 CLINICAL DATA:  Fatigue. EXAM: PORTABLE CHEST 1 VIEW COMPARISON:  Chest x-ray dated December 27, 2022. FINDINGS: Stable cardiomediastinal silhouette with mild cardiomegaly and moderate hiatal hernia. Resolved interstitial thickening at the lung bases. Resolved trace pleural effusions. No consolidation or pneumothorax. No acute osseous abnormality. IMPRESSION: 1. No active disease. Resolved pulmonary edema and trace pleural effusions. Electronically Signed   By: Obie Dredge M.D.   On:  12/30/2022 10:41   CT MAXILLOFACIAL WO CONTRAST  Result Date: 12/28/2022 CLINICAL DATA:  Right-sided jaw pain and swelling EXAM: CT MAXILLOFACIAL WITHOUT CONTRAST TECHNIQUE: Multidetector CT imaging of the maxillofacial structures was performed. Multiplanar CT image reconstructions were also generated. RADIATION DOSE REDUCTION: This exam was performed according to the departmental dose-optimization program which includes automated exposure control, adjustment of the mA and/or kV according to patient size and/or use of iterative reconstruction technique. COMPARISON:  12/03/2020 FINDINGS: Osseous: No acute maxillofacial bone fracture. Bony orbital walls are intact. Mandible intact. Temporomandibular joints are aligned without dislocation. Mild degenerative changes of the temporomandibular joints. No erosions. Orbits: Negative. No traumatic or inflammatory finding. Sinuses: Paranasal sinuses and mastoid air cells are clear. Soft tissues: No soft tissues swelling or fluid collections. Limited intracranial: No significant or unexpected finding. IMPRESSION: 1. No acute maxillofacial bone fracture. 2. Mild degenerative changes of the temporomandibular joints. Electronically Signed   By: Duanne Guess D.O.   On: 12/28/2022 15:10   DG Chest 2 View  Result Date: 12/27/2022 CLINICAL DATA:  Shortness of breath. EXAM: CHEST - 2 VIEW COMPARISON:  Chest x-ray dated December 25, 2022. FINDINGS: Stable cardiomediastinal silhouette with mild cardiomegaly and moderate hiatal hernia. Slightly increased mild bibasilar interstitial thickening. Unchanged trace pleural effusions. No consolidation or pneumothorax. No acute osseous abnormality. IMPRESSION: 1. Slightly increased mild bibasilar interstitial edema. Unchanged trace pleural effusions. Electronically Signed   By: Obie Dredge M.D.   On: 12/27/2022 14:10   DG Chest Port 1 View  Result Date: 12/25/2022 CLINICAL DATA:  Shortness of breath. EXAM: PORTABLE CHEST 1  VIEW COMPARISON:  Radiograph 12/12/2022 FINDINGS: Cardiomegaly is stable. There are small bilateral pleural effusions, similar or mildly improved from prior exam. Vascular congestion without edema. No acute airspace disease. No pneumothorax. Left axillary surgical clips. The bones are under mineralized. IMPRESSION: Cardiomegaly with vascular congestion. Small bilateral pleural effusions, similar or mildly improved from prior exam. Electronically Signed   By: Narda Rutherford  M.D.   On: 12/25/2022 22:13   CT RENAL STONE STUDY  Result Date: 12/13/2022 CLINICAL DATA:  Acute renal insufficiency with history of breast cancer. Evaluate for bladder outlet obstruction or hydronephrosis. EXAM: CT ABDOMEN AND PELVIS WITHOUT CONTRAST TECHNIQUE: Multidetector CT imaging of the abdomen and pelvis was performed following the standard protocol without IV contrast. RADIATION DOSE REDUCTION: This exam was performed according to the departmental dose-optimization program which includes automated exposure control, adjustment of the mA and/or kV according to patient size and/or use of iterative reconstruction technique. COMPARISON:  PET 11/13/2022. FINDINGS: Lower chest: Clear lung bases. Mild cardiomegaly with right coronary artery calcification. Small bilateral pleural effusions. Moderate hiatal hernia. Hepatobiliary: Hepatic cysts. A vague high right hepatic lobe 8 mm lesion on 10/02 may have been present on the prior PET. Measures greater than fluid density. 14 mm gallstone without acute cholecystitis or biliary duct dilatation. Pancreas: Normal, without mass or ductal dilatation. Spleen: Normal in size, without focal abnormality. Adrenals/Urinary Tract: Normal left adrenal gland. Mild right adrenal nodularity is similar to 2015 and likely due to a small adenoma which does not require imaging follow-up. No renal calculi or hydronephrosis. No hydroureter or ureteric calculi. No bladder calculi. No significant bladder  distention. Stomach/Bowel: Normal remainder of the stomach. Extensive colonic diverticulosis. Normal terminal ileum. Normal small bowel. Vascular/Lymphatic: Advanced aortic and branch vessel atherosclerosis. No abdominopelvic adenopathy. Reproductive: Hysterectomy.  No adnexal mass. Other: No significant free fluid.  Mild pelvic floor laxity. Musculoskeletal: Osteopenia. Trace L3-4 anterolisthesis. Convex right lumbar spine curvature. IMPRESSION: 1. No hydronephrosis, bladder distension or explanation for renal failure. 2. Small bilateral pleural effusions. 3. Moderate hiatal hernia 4. Incidental findings, including: Coronary artery atherosclerosis. Aortic Atherosclerosis (ICD10-I70.0). Cholelithiasis. Pelvic floor laxity. 5. Possible 8 mm high right hepatic lobe lesion. Given absence of hypermetabolism in this area on recent PET, felt unlikely to represent metastasis. This can be re-evaluated on routine follow-up. Electronically Signed   By: Jeronimo Greaves M.D.   On: 12/13/2022 17:24   DG Chest Port 1 View  Result Date: 12/12/2022 CLINICAL DATA:  Bilateral pleural effusion. EXAM: PORTABLE CHEST 1 VIEW COMPARISON:  12/11/2022 FINDINGS: The cardio pericardial silhouette is enlarged. Basilar atelectasis with small bilateral pleural effusions. Interstitial markings are diffusely coarsened with chronic features. Bones are diffusely demineralized. Telemetry leads overlie the chest. IMPRESSION: Basilar atelectasis with small bilateral pleural effusions. Electronically Signed   By: Kennith Center M.D.   On: 12/12/2022 10:25   DG Chest Port 1 View  Result Date: 12/11/2022 CLINICAL DATA:  Atelectasis. EXAM: PORTABLE CHEST 1 VIEW COMPARISON:  12/10/2022 FINDINGS: Persistent densities at both lung bases likely represent bilateral pleural effusions with atelectasis. Cannot exclude basilar airspace disease particularly on the right side. Heart size appears within normal limits and stable. The trachea is midline.  Atherosclerotic calcifications at the aortic arch. Again noted are surgical clips overlying the left side of the chest and breast region. IMPRESSION: Persistent densities at both lung bases. Findings are most compatible with bilateral pleural effusions with atelectasis. Cannot exclude airspace disease particularly on the right side. Electronically Signed   By: Richarda Overlie M.D.   On: 12/11/2022 08:21   DG CHEST PORT 1 VIEW  Result Date: 12/10/2022 CLINICAL DATA:  Acute respiratory failure.  Shortness of breath EXAM: PORTABLE CHEST 1 VIEW COMPARISON:  12/08/2022 FINDINGS: Surgical clips of the left breast and left axilla. Patient rotated to the right. Midline trachea. Mild cardiomegaly. Atherosclerosis in the transverse aorta. Similar small bilateral pleural effusions.  No pneumothorax. Suspect pulmonary venous congestion. Persistent bibasilar airspace disease. IMPRESSION: No significant change since 12/08/2022. Small bilateral pleural effusions with adjacent airspace disease, most likely atelectasis. Cardiomegaly with mild pulmonary venous congestion. Aortic Atherosclerosis (ICD10-I70.0). Electronically Signed   By: Jeronimo Greaves M.D.   On: 12/10/2022 15:09   US RENAL  Result Date: 12/08/2022 CLINICAL DATA:  Acute kidney injury. EXAM: RENAL / URINARY TRACT ULTRASOUND COMPLETE COMPARISON:  None Available. FINDINGS: Right Kidney: Renal measurements: 8.4 cm x 4.7 cm x 4.1 cm = volume: 84.5 mL. Echogenicity within normal limits. No mass or hydronephrosis visualized. Left Kidney: Renal measurements: 8.5 cm x 4.4 cm x 4.9 cm = volume: 95.1 mL. Echogenicity within normal limits. No mass or hydronephrosis visualized. Bladder: Appears normal for degree of bladder distention. The bilateral ureteral jets are not visualized. Other: Of incidental note is the presence of a 1.5 cm x 1.2 cm x 1.6 cm cystic appearing structure within the posterior aspect of the right lobe of the liver. No flow is seen within this region on color  Doppler evaluation. IMPRESSION: 1. Unremarkable renal ultrasound. 2. Simple hepatic cyst. Electronically Signed   By: Aram Candela M.D.   On: 12/08/2022 19:57   DG Chest 1 View  Result Date: 12/08/2022 CLINICAL DATA:  Cough EXAM: CHEST  1 VIEW COMPARISON:  12/04/2022 plain film and CT FINDINGS: Chin overlies the apices minimally. Midline trachea. Cardiomegaly accentuated by AP portable technique. Small bilateral pleural effusions are new. No pneumothorax. Bibasilar airspace disease. IMPRESSION: Development of small bilateral pleural effusions and bibasilar airspace disease, most likely atelectasis. Infection or aspiration could look similar. Aortic Atherosclerosis (ICD10-I70.0). Electronically Signed   By: Jeronimo Greaves M.D.   On: 12/08/2022 18:05   ECHOCARDIOGRAM COMPLETE  Result Date: 12/05/2022    ECHOCARDIOGRAM REPORT   Patient Name:   ALEXANDRIA NERONE Date of Exam: 12/05/2022 Medical Rec #:  [id number]    Height:       63.0 in Accession #:    [id number]   Weight:       138.0 lb Date of Birth:  [birth year]    BSA:          1.652 m Patient Age:    82 years     BP:           108/56 mmHg Patient Gender: F            HR:           90 bpm. Exam Location:  Inpatient Procedure: 2D Echo, Cardiac Doppler and Color Doppler Indications:    CHF Acute Systolic I50.21  History:        Patient has prior history of Echocardiogram examinations, most                 recent 05/25/2017. COPD; Risk Factors:Dyslipidemia.  Sonographer:    Harriette Bouillon RDCS Referring Phys: [id number] [facility] GOEL IMPRESSIONS  1. Left ventricular ejection fraction, by estimation, is 60 to 65%. The left ventricle has normal function. The left ventricle has no regional wall motion abnormalities. There is mild concentric left ventricular hypertrophy. Left ventricular diastolic parameters are consistent with Grade I diastolic dysfunction (impaired relaxation). Elevated left atrial pressure.  2. Right ventricular systolic function is normal. The right  ventricular size is normal.  3. The mitral valve is normal in structure. No evidence of mitral valve regurgitation. No evidence of mitral stenosis.  4. The aortic valve is tricuspid. Aortic valve regurgitation is not visualized. No aortic stenosis  is present. FINDINGS  Left Ventricle: Left ventricular ejection fraction, by estimation, is 60 to 65%. The left ventricle has normal function. The left ventricle has no regional wall motion abnormalities. The left ventricular internal cavity size was normal in size. There is  mild concentric left ventricular hypertrophy. Left ventricular diastolic parameters are consistent with Grade I diastolic dysfunction (impaired relaxation). Elevated left atrial pressure. Right Ventricle: The right ventricular size is normal. Right ventricular systolic function is normal. Left Atrium: Left atrial size was normal in size. Right Atrium: Right atrial size was normal in size. Pericardium: There is no evidence of pericardial effusion. Mitral Valve: The mitral valve is normal in structure. Mild mitral annular calcification. No evidence of mitral valve regurgitation. No evidence of mitral valve stenosis. Tricuspid Valve: The tricuspid valve is normal in structure. Tricuspid valve regurgitation is mild . No evidence of tricuspid stenosis. Aortic Valve: The aortic valve is tricuspid. Aortic valve regurgitation is not visualized. No aortic stenosis is present. Pulmonic Valve: The pulmonic valve was normal in structure. Pulmonic valve regurgitation is trivial. No evidence of pulmonic stenosis. Aorta: The aortic root is normal in size and structure. Venous: The inferior vena cava was not well visualized. IAS/Shunts: The interatrial septum was not well visualized.  LEFT VENTRICLE PLAX 2D LVIDd:         3.30 cm   Diastology LVIDs:         2.10 cm   LV e' medial:    5.44 cm/s LV PW:         1.20 cm   LV E/e' medial:  17.5 LV IVS:        1.20 cm   LV e' lateral:   7.18 cm/s LVOT diam:     2.00 cm    LV E/e' lateral: 13.3 LV SV:         63 LV SV Index:   38 LVOT Area:     3.14 cm  RIGHT VENTRICLE TAPSE (M-mode): 1.5 cm LEFT ATRIUM             Index LA diam:        2.10 cm 1.27 cm/m LA Vol (A2C):   39.2 ml 23.74 ml/m LA Vol (A4C):   49.1 ml 29.73 ml/m LA Biplane Vol: 46.1 ml 27.91 ml/m  AORTIC VALVE LVOT Vmax:   99.20 cm/s LVOT Vmean:  73.400 cm/s LVOT VTI:    0.202 m  AORTA Ao Root diam: 3.00 cm Ao Asc diam:  3.30 cm MITRAL VALVE               TRICUSPID VALVE MV Area (PHT): 3.99 cm    TR Peak grad:   37.5 mmHg MV Decel Time: 190 msec    TR Vmax:        306.00 cm/s MV E velocity: 95.20 cm/s MV A velocity: 99.50 cm/s  SHUNTS MV E/A ratio:  0.96        Systemic VTI:  0.20 m                            Systemic Diam: 2.00 cm Olga Millers MD Electronically signed by Olga Millers MD Signature Date/Time: 12/05/2022/2:27:54 PM    Final    CT Angio Chest PE W and/or Wo Contrast  Result Date: 12/04/2022 CLINICAL DATA:  Shortness of breath EXAM: CT ANGIOGRAPHY CHEST WITH CONTRAST TECHNIQUE: Multidetector CT imaging of the chest was performed using the standard protocol during bolus administration  of intravenous contrast. Multiplanar CT image reconstructions and MIPs were obtained to evaluate the vascular anatomy. RADIATION DOSE REDUCTION: This exam was performed according to the departmental dose-optimization program which includes automated exposure control, adjustment of the mA and/or kV according to patient size and/or use of iterative reconstruction technique. CONTRAST:  75mL OMNIPAQUE IOHEXOL 350 MG/ML SOLN COMPARISON:  CTA chest dated November 17, 2022 FINDINGS: Cardiovascular: No evidence of pulmonary embolus. Normal heart size. No pericardial effusion. Normal caliber thoracic aorta with severe atherosclerotic disease. Moderate coronary artery calcifications. Mediastinum/Nodes: Moderate hiatal hernia. Thyroid is unremarkable. No enlarged lymph nodes seen in the chest. Lungs/Pleura: Central airways are  patent. Moderate emphysema. Subpleural reticulations of the anterior right lung, consistent with postradiation change. Mild bilateral bronchial wall thickening. Unchanged clustered nodules and mucous plugging of the right lower lobe. New clustered solid nodule seen in the lingula. Reference new nodule measuring 4 mm on series 12, image 64. No pleural effusion or pneumothorax. Upper Abdomen: No acute abnormality. Musculoskeletal: Postsurgical changes of the right breast. No aggressive appearing osseous lesions. Review of the MIP images confirms the above findings. IMPRESSION: 1. No evidence of pulmonary embolus. 2. Mild bilateral bronchial wall thickening, consistent with bronchitis. 3. New clustered solid nodules seen in the lingula and unchanged clustered nodules and mucous plugging of the right lower lobe, likely due to infection or aspiration. Recommend follow-up in 3 months to ensure resolution. 4. Moderate hiatal hernia. 5. Aortic Atherosclerosis (ICD10-I70.0) and Emphysema (ICD10-J43.9). Electronically Signed   By: Allegra Lai M.D.   On: 12/04/2022 19:40   DG Chest Port 1 View  Result Date: 12/04/2022 CLINICAL DATA:  Shortness of breath EXAM: PORTABLE CHEST 1 VIEW COMPARISON:  11/17/2022 FINDINGS: Heart and mediastinal contours within normal limits. No confluent airspace opacities or effusions. No acute bony abnormality. IMPRESSION: No active disease. Electronically Signed   By: Charlett Nose M.D.   On: 12/04/2022 18:26    Labs: BNP (last 3 results) Recent Labs    12/04/22 1553 12/25/22 2137 12/30/22 1015  BNP 164.7* 222.9* 131.3*   Basic Metabolic Panel: Recent Labs  Lab 12/28/22 0345 12/28/22 0345 12/29/22 0828 12/30/22 1015 12/31/22 0606 01/01/23 0832 01/02/23 1006 01/03/23 0543  NA  --    < > 135 137 133* 137 136 138  K 4.2  --  3.7 3.4* 3.9 3.6 3.5 3.5  CL  --    < > 99 98 96* 99 100 103  CO2  --    < > 23 28 27 28 25 26   GLUCOSE  --    < > 110* 109* 112* 113* 146* 116*   BUN  --    < > 18 19 21 19 18 17   CREATININE  --    < > 1.74* 1.89* 1.60* 1.76* 1.66* 1.44*  CALCIUM  --    < > 8.2* 9.1 8.8* 9.1 8.7* 8.5*  MG 1.6*  --  2.6* 2.0  --   --   --   --    < > = values in this interval not displayed.   Liver Function Tests: Recent Labs  Lab 12/30/22 1015  AST 20  ALT 13  ALKPHOS 69  BILITOT 0.4  PROT 7.7  ALBUMIN 3.4*  No results for input(s): "LIPASE", "AMYLASE" in the last 168 hours. No results for input(s): "AMMONIA" in the last 168 hours. CBC: Recent Labs  Lab 12/30/22 1015  WBC 6.6  NEUTROABS 4.4  HGB 9.0*  HCT 27.9*  MCV 104.1*  PLT 427*      Component Value Date/Time   COLORURINE STRAW (A) 12/30/2022 1248   APPEARANCEUR CLEAR 12/30/2022 1248   LABSPEC 1.006 12/30/2022 1248   PHURINE 6.0 12/30/2022 1248   GLUCOSEU NEGATIVE 12/30/2022 1248   GLUCOSEU NEGATIVE 07/13/2018 0938   HGBUR NEGATIVE 12/30/2022 1248   BILIRUBINUR NEGATIVE 12/30/2022 1248   KETONESUR NEGATIVE 12/30/2022 1248   PROTEINUR NEGATIVE 12/30/2022 1248   UROBILINOGEN 0.2 07/13/2018 0938   NITRITE NEGATIVE 12/30/2022 1248   LEUKOCYTESUR NEGATIVE 12/30/2022 1248   Sepsis Labs Recent Labs  Lab 12/30/22 1015  WBC 6.6  Microbiology Recent Results (from the past 240 hour(s))  Culture, blood (Routine X 2) w Reflex to ID Panel     Status: None (Preliminary result)   Collection Time: 12/30/22 11:13 AM   Specimen: BLOOD  Result Value Ref Range Status   Specimen Description   Final    BLOOD RIGHT ANTECUBITAL Performed at [facility], [address] [place], [place] [address]    Special Requests   Final    BOTTLES DRAWN AEROBIC AND ANAEROBIC Blood Culture adequate volume Performed at [facility], [address], [place], [place] [address]    Culture   Final    NO GROWTH 4 DAYS Performed at [facility], [address] [place], [place] [address]    Report Status PENDING  Incomplete  Culture, blood (Routine X 2)  w Reflex to ID Panel     Status: None (Preliminary result)   Collection Time: 12/30/22 11:15 AM   Specimen: BLOOD RIGHT ARM  Result Value Ref Range Status   Specimen Description   Final    BLOOD RIGHT ARM Performed at [facility], [address], [place], [place] [address]    Special Requests   Final    BOTTLES DRAWN AEROBIC AND ANAEROBIC Blood Culture adequate volume Performed at [facility], [address], [place], [place] [address]    Culture   Final    NO GROWTH 4 DAYS Performed at [facility], [address] [place], [place] [address]    Report Status PENDING  Incomplete  MRSA Next Gen by PCR, Nasal     Status: None   Collection Time: 12/30/22  6:00 PM   Specimen: Nasal Mucosa; Nasal Swab  Result Value Ref Range Status   MRSA by PCR Next Gen NOT DETECTED NOT DETECTED Final    Comment: (NOTE) The GeneXpert MRSA Assay (FDA approved for NASAL specimens only), is one component of a comprehensive MRSA colonization surveillance program. It is not intended to diagnose MRSA infection nor to guide or monitor treatment for MRSA infections. Test performance is not FDA approved in patients less than 51 years old. Performed at [facility], [address], [place], [place] [address]    Time coordinating discharge: 35 minutes  SIGNED: Lanae Boast, MD  [facility] 01/03/2023, 11:07 AM  If 7PM-7AM, please contact night-coverage [email or web address]

## 2023-01-04 DIAGNOSIS — D649 Anemia, unspecified: Secondary | ICD-10-CM | POA: Diagnosis not present

## 2023-01-04 DIAGNOSIS — C50911 Malignant neoplasm of unspecified site of right female breast: Secondary | ICD-10-CM | POA: Diagnosis not present

## 2023-01-04 DIAGNOSIS — E8721 Acute metabolic acidosis: Secondary | ICD-10-CM | POA: Diagnosis not present

## 2023-01-04 LAB — CULTURE, BLOOD (ROUTINE X 2)
Culture: NO GROWTH
Culture: NO GROWTH
Special Requests: ADEQUATE
Special Requests: ADEQUATE

## 2023-01-05 ENCOUNTER — Other Ambulatory Visit: Payer: Self-pay | Admitting: *Deleted

## 2023-01-05 ENCOUNTER — Other Ambulatory Visit: Payer: Self-pay

## 2023-01-05 NOTE — Patient Outreach (Signed)
Late entry for 01/04/23. Stacey [name] resides in [facility]. Screening for potential chronic care management services as a benefit of health plan and primary care provider.  Telephonic meeting with Deseree, SNF social worker. Ms. Birkeland recently admitted back from the hospital.   Will continue to follow and plan re-referral to complex care management team upon SNF discharge. Ms. Juday was previously active with [facility] team.  Raiford Noble, MSN, RN, BSN [place]  [facility], [facility] RN Post- Acute Care Manager Direct Dial: [phone]

## 2023-01-07 ENCOUNTER — Other Ambulatory Visit: Payer: Self-pay

## 2023-01-07 ENCOUNTER — Inpatient Hospital Stay: Payer: Medicare Other

## 2023-01-07 ENCOUNTER — Inpatient Hospital Stay: Payer: Medicare Other | Admitting: Hematology and Oncology

## 2023-01-11 ENCOUNTER — Telehealth: Payer: Self-pay | Admitting: Hematology and Oncology

## 2023-01-11 ENCOUNTER — Inpatient Hospital Stay (HOSPITAL_COMMUNITY)
Admission: EM | Admit: 2023-01-11 | Discharge: 2023-01-13 | DRG: 683 | Disposition: A | Discharge status: Skilled Nursing Facility | Payer: Medicare Other | Source: Skilled Nursing Facility | Attending: Internal Medicine | Admitting: Internal Medicine

## 2023-01-11 ENCOUNTER — Other Ambulatory Visit: Payer: Self-pay

## 2023-01-11 ENCOUNTER — Encounter (HOSPITAL_COMMUNITY): Payer: Self-pay

## 2023-01-11 ENCOUNTER — Observation Stay (HOSPITAL_COMMUNITY): Payer: Medicare Other

## 2023-01-11 DIAGNOSIS — D631 Anemia in chronic kidney disease: Secondary | ICD-10-CM | POA: Diagnosis present

## 2023-01-11 DIAGNOSIS — N390 Urinary tract infection, site not specified: Secondary | ICD-10-CM | POA: Diagnosis present

## 2023-01-11 DIAGNOSIS — Z1721 Progesterone receptor positive status: Secondary | ICD-10-CM

## 2023-01-11 DIAGNOSIS — F419 Anxiety disorder, unspecified: Secondary | ICD-10-CM | POA: Diagnosis present

## 2023-01-11 DIAGNOSIS — N1832 Chronic kidney disease, stage 3b: Secondary | ICD-10-CM | POA: Diagnosis present

## 2023-01-11 DIAGNOSIS — E785 Hyperlipidemia, unspecified: Secondary | ICD-10-CM | POA: Diagnosis present

## 2023-01-11 DIAGNOSIS — Z170 Estrogen receptor positive status [ER+]: Secondary | ICD-10-CM

## 2023-01-11 DIAGNOSIS — Z803 Family history of malignant neoplasm of breast: Secondary | ICD-10-CM

## 2023-01-11 DIAGNOSIS — C50911 Malignant neoplasm of unspecified site of right female breast: Secondary | ICD-10-CM | POA: Diagnosis present

## 2023-01-11 DIAGNOSIS — R531 Weakness: Secondary | ICD-10-CM | POA: Diagnosis not present

## 2023-01-11 DIAGNOSIS — Z87891 Personal history of nicotine dependence: Secondary | ICD-10-CM

## 2023-01-11 DIAGNOSIS — I499 Cardiac arrhythmia, unspecified: Secondary | ICD-10-CM | POA: Diagnosis not present

## 2023-01-11 DIAGNOSIS — K529 Noninfective gastroenteritis and colitis, unspecified: Secondary | ICD-10-CM | POA: Diagnosis present

## 2023-01-11 DIAGNOSIS — Z7951 Long term (current) use of inhaled steroids: Secondary | ICD-10-CM | POA: Diagnosis not present

## 2023-01-11 DIAGNOSIS — Z7401 Bed confinement status: Secondary | ICD-10-CM | POA: Diagnosis not present

## 2023-01-11 DIAGNOSIS — Z7901 Long term (current) use of anticoagulants: Secondary | ICD-10-CM | POA: Diagnosis not present

## 2023-01-11 DIAGNOSIS — I130 Hypertensive heart and chronic kidney disease with heart failure and stage 1 through stage 4 chronic kidney disease, or unspecified chronic kidney disease: Secondary | ICD-10-CM | POA: Diagnosis present

## 2023-01-11 DIAGNOSIS — R0600 Dyspnea, unspecified: Secondary | ICD-10-CM | POA: Diagnosis not present

## 2023-01-11 DIAGNOSIS — Z833 Family history of diabetes mellitus: Secondary | ICD-10-CM

## 2023-01-11 DIAGNOSIS — N179 Acute kidney failure, unspecified: Secondary | ICD-10-CM | POA: Diagnosis not present

## 2023-01-11 DIAGNOSIS — R55 Syncope and collapse: Secondary | ICD-10-CM | POA: Diagnosis not present

## 2023-01-11 DIAGNOSIS — Z86718 Personal history of other venous thrombosis and embolism: Secondary | ICD-10-CM

## 2023-01-11 DIAGNOSIS — Z888 Allergy status to other drugs, medicaments and biological substances status: Secondary | ICD-10-CM

## 2023-01-11 DIAGNOSIS — Z8249 Family history of ischemic heart disease and other diseases of the circulatory system: Secondary | ICD-10-CM | POA: Diagnosis not present

## 2023-01-11 DIAGNOSIS — I951 Orthostatic hypotension: Secondary | ICD-10-CM | POA: Insufficient documentation

## 2023-01-11 DIAGNOSIS — N183 Chronic kidney disease, stage 3 unspecified: Secondary | ICD-10-CM | POA: Insufficient documentation

## 2023-01-11 DIAGNOSIS — Z79811 Long term (current) use of aromatase inhibitors: Secondary | ICD-10-CM | POA: Diagnosis not present

## 2023-01-11 DIAGNOSIS — T451X5A Adverse effect of antineoplastic and immunosuppressive drugs, initial encounter: Secondary | ICD-10-CM | POA: Diagnosis present

## 2023-01-11 DIAGNOSIS — R42 Dizziness and giddiness: Secondary | ICD-10-CM | POA: Diagnosis not present

## 2023-01-11 DIAGNOSIS — Z91048 Other nonmedicinal substance allergy status: Secondary | ICD-10-CM

## 2023-01-11 DIAGNOSIS — Z9221 Personal history of antineoplastic chemotherapy: Secondary | ICD-10-CM

## 2023-01-11 DIAGNOSIS — D630 Anemia in neoplastic disease: Secondary | ICD-10-CM | POA: Diagnosis not present

## 2023-01-11 DIAGNOSIS — Z91030 Bee allergy status: Secondary | ICD-10-CM

## 2023-01-11 DIAGNOSIS — Z923 Personal history of irradiation: Secondary | ICD-10-CM | POA: Diagnosis not present

## 2023-01-11 DIAGNOSIS — R197 Diarrhea, unspecified: Secondary | ICD-10-CM | POA: Diagnosis present

## 2023-01-11 DIAGNOSIS — E876 Hypokalemia: Secondary | ICD-10-CM

## 2023-01-11 DIAGNOSIS — I5032 Chronic diastolic (congestive) heart failure: Secondary | ICD-10-CM | POA: Diagnosis present

## 2023-01-11 DIAGNOSIS — Z881 Allergy status to other antibiotic agents status: Secondary | ICD-10-CM

## 2023-01-11 DIAGNOSIS — Z96698 Presence of other orthopedic joint implants: Secondary | ICD-10-CM | POA: Diagnosis present

## 2023-01-11 DIAGNOSIS — K219 Gastro-esophageal reflux disease without esophagitis: Secondary | ICD-10-CM | POA: Diagnosis present

## 2023-01-11 DIAGNOSIS — Z1732 Human epidermal growth factor receptor 2 negative status: Secondary | ICD-10-CM

## 2023-01-11 DIAGNOSIS — E860 Dehydration: Secondary | ICD-10-CM | POA: Diagnosis present

## 2023-01-11 DIAGNOSIS — D649 Anemia, unspecified: Secondary | ICD-10-CM | POA: Diagnosis present

## 2023-01-11 DIAGNOSIS — N1831 Chronic kidney disease, stage 3a: Secondary | ICD-10-CM | POA: Diagnosis not present

## 2023-01-11 DIAGNOSIS — Z79899 Other long term (current) drug therapy: Secondary | ICD-10-CM

## 2023-01-11 DIAGNOSIS — Z885 Allergy status to narcotic agent status: Secondary | ICD-10-CM

## 2023-01-11 DIAGNOSIS — I82409 Acute embolism and thrombosis of unspecified deep veins of unspecified lower extremity: Secondary | ICD-10-CM | POA: Diagnosis present

## 2023-01-11 DIAGNOSIS — Z90710 Acquired absence of both cervix and uterus: Secondary | ICD-10-CM

## 2023-01-11 LAB — URINALYSIS, ROUTINE W REFLEX MICROSCOPIC
Bilirubin Urine: NEGATIVE
Glucose, UA: NEGATIVE mg/dL
Ketones, ur: NEGATIVE mg/dL
Nitrite: NEGATIVE
Protein, ur: 100 mg/dL — AB
Specific Gravity, Urine: 1.009 (ref 1.005–1.030)
WBC, UA: >50 WBC/hpf (ref 0–5)
pH: 5 (ref 5.0–8.0)

## 2023-01-11 LAB — BASIC METABOLIC PANEL
Anion gap: 15 (ref 5–15)
BUN: 29 mg/dL — ABNORMAL HIGH (ref 8–23)
CO2: 25 mmol/L (ref 22–32)
Calcium: 6.4 mg/dL — CL (ref 8.9–10.3)
Chloride: 98 mmol/L (ref 98–111)
Creatinine, Ser: 1.83 mg/dL — ABNORMAL HIGH (ref 0.44–1.00)
GFR, Estimated: 27 mL/min — ABNORMAL LOW (ref >60)
Glucose, Bld: 111 mg/dL — ABNORMAL HIGH (ref 70–99)
Potassium: 2.7 mmol/L — CL (ref 3.5–5.1)
Sodium: 138 mmol/L (ref 135–145)

## 2023-01-11 LAB — MAGNESIUM: Magnesium: 0.5 mg/dL — CL (ref 1.7–2.4)

## 2023-01-11 LAB — CBC
HCT: 25.3 % — ABNORMAL LOW (ref 36.0–46.0)
Hemoglobin: 8.3 g/dL — ABNORMAL LOW (ref 12.0–15.0)
MCH: 32.8 pg (ref 26.0–34.0)
MCHC: 32.8 g/dL (ref 30.0–36.0)
MCV: 100 fL (ref 80.0–100.0)
Platelets: 328 10*3/uL (ref 150–400)
RBC: 2.53 MIL/uL — ABNORMAL LOW (ref 3.87–5.11)
RDW: 15.3 % (ref 11.5–15.5)
WBC: 9.7 10*3/uL (ref 4.0–10.5)
nRBC: 0 % (ref 0.0–0.2)

## 2023-01-11 MED ORDER — ACETAMINOPHEN 650 MG RE SUPP: 650.0000 mg | Freq: Four times a day (QID) | RECTAL | Status: DC | PRN

## 2023-01-11 MED ORDER — POTASSIUM CHLORIDE CRYS ER 20 MEQ PO TBCR
40.0000 meq | EXTENDED_RELEASE_TABLET | Freq: Once | ORAL | Status: AC
  Administered 2023-01-11: 40 meq via ORAL
  Filled 2023-01-11: qty 2

## 2023-01-11 MED ORDER — SODIUM CHLORIDE 0.9 % IV SOLN: INTRAVENOUS | Status: AC

## 2023-01-11 MED ORDER — ONDANSETRON HCL 4 MG/2ML IJ SOLN: 4.0000 mg | Freq: Four times a day (QID) | INTRAMUSCULAR | Status: DC | PRN

## 2023-01-11 MED ORDER — TRAZODONE HCL 50 MG PO TABS
25.0000 mg | ORAL_TABLET | Freq: Every evening | ORAL | Status: DC | PRN
  Administered 2023-01-11: 25 mg via ORAL
  Filled 2023-01-11: qty 1

## 2023-01-11 MED ORDER — ACETAMINOPHEN 325 MG PO TABS
650.0000 mg | ORAL_TABLET | Freq: Four times a day (QID) | ORAL | Status: DC | PRN
  Administered 2023-01-11: 650 mg via ORAL
  Filled 2023-01-11: qty 2

## 2023-01-11 MED ORDER — SODIUM CHLORIDE 0.9 % IV SOLN
1.0000 g | Freq: Once | INTRAVENOUS | Status: AC
  Administered 2023-01-11: 1 g via INTRAVENOUS
  Filled 2023-01-11: qty 10

## 2023-01-11 MED ORDER — ALBUTEROL SULFATE (2.5 MG/3ML) 0.083% IN NEBU: 2.5000 mg | INHALATION_SOLUTION | RESPIRATORY_TRACT | Status: DC | PRN

## 2023-01-11 MED ORDER — CALCIUM GLUCONATE-NACL 2-0.675 GM/100ML-% IV SOLN
2.0000 g | Freq: Once | INTRAVENOUS | Status: AC
  Administered 2023-01-11: 2000 mg via INTRAVENOUS
  Filled 2023-01-11: qty 100

## 2023-01-11 MED ORDER — APIXABAN 2.5 MG PO TABS
2.5000 mg | ORAL_TABLET | Freq: Two times a day (BID) | ORAL | Status: DC
  Administered 2023-01-11 – 2023-01-13 (×4): 2.5 mg via ORAL
  Filled 2023-01-11 (×4): qty 1

## 2023-01-11 MED ORDER — MAGNESIUM SULFATE 2 GM/50ML IV SOLN
2.0000 g | Freq: Once | INTRAVENOUS | Status: AC
  Administered 2023-01-11: 2 g via INTRAVENOUS
  Filled 2023-01-11: qty 50

## 2023-01-11 MED ORDER — ONDANSETRON HCL 4 MG PO TABS: 4.0000 mg | ORAL_TABLET | Freq: Four times a day (QID) | ORAL | Status: DC | PRN

## 2023-01-11 MED ORDER — POTASSIUM CHLORIDE 10 MEQ/100ML IV SOLN
10.0000 meq | INTRAVENOUS | Status: AC
Start: 2023-01-11 — End: 2023-01-11
  Administered 2023-01-11 (×4): 10 meq via INTRAVENOUS
  Filled 2023-01-11 (×4): qty 100

## 2023-01-11 MED ORDER — SODIUM CHLORIDE 0.9 % IV BOLUS
1000.0000 mL | Freq: Once | INTRAVENOUS | Status: AC
  Administered 2023-01-11: 1000 mL via INTRAVENOUS

## 2023-01-11 NOTE — ED Notes (Signed)
Pt ambulatory to bathroom with standby assist for urine sample. Pt states she has some dizziness

## 2023-01-11 NOTE — ED Provider Notes (Signed)
[place] EMERGENCY DEPARTMENT AT [facility] Provider Note   CSN: [id number] Arrival date & time: 01/11/23  1108     History  Chief Complaint  Patient presents with   Dizziness    AMELINDA SIEJA is a 82 y.o. female.  The history is provided by the patient and medical records. No language interpreter was used.  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  Patient states for the past several weeks she has gone through quite a bit.  States she has had multiple hospitalization due to sepsis related to pneumonia and she is currently at a rehab facility for the past week.  This morning she got up and felt lightheadedness and dizziness with sensation of feeling like she is unsteady on her feet and felt like she was going to pass out.  She notified the staff and was brought here for further assessment.  EMS noted the patient had positive orthostatic vital sign.  While at rest the patient states she is feeling fine.  She felt she may be a bit dehydrated as she is unable to drink enough fluid at the facility.  She denies any fever or chills no nausea vomiting or diarrhea no chest pain or trouble breathing no abdominal pain no urinary symptoms denies any severe headache or diplopia.  Home Medications Prior to Admission medications   Medication Sig Start Date End Date Taking? Authorizing Provider  anastrozole (ARIMIDEX) 1 MG tablet TAKE 1 TABLET(1 MG) BY MOUTH DAILY 12/23/22   Rachel Moulds, MD  apixaban (ELIQUIS) 2.5 MG TABS tablet Take 1 tablet (2.5 mg total) by mouth 2 (two) times daily. 01/03/23   Lanae Boast, MD  budesonide (PULMICORT) 0.25 MG/2ML nebulizer solution Take 2 mLs (0.25 mg total) by nebulization 2 (two) times daily for 5 days. 12/16/22 01/01/23  Pokhrel, Rebekah Chesterfield, MD  CALCIUM CARBONATE-VITAMIN D PO Take 1 tablet by mouth 2 (two) times  daily.    [provider]  fulvestrant (FASLODEX) 250 MG/5ML injection Inject 250 mg into the muscle every 28 (twenty-eight) days. Patient not taking: Reported on 01/01/2023 11/23/19   [provider]  furosemide (LASIX) 40 MG tablet Take 1 tablet (40 mg total) by mouth daily as needed for 3 days for fluid or edema (dyspnea, weight gain of 3 lbs in 24 hours), THEN 1 tablet (40 mg total) daily. Patient not taking: Reported on 01/01/2023 12/29/22 01/31/23  Narda Bonds, MD  loratadine (CLARITIN) 10 MG tablet Take 10 mg by mouth every morning.    [provider]  omeprazole (PRILOSEC) 20 MG capsule Take 1 capsule by mouth daily Patient not taking: Reported on 01/01/2023 09/14/22   [name], MD  palbociclib Ilda Foil) 100 MG tablet HOLD PER ONCOLOGIST. FOLLOW-UP WITH ONCOLOGIST PRIOR TO RESTARTING 12/28/22   Narda Bonds, MD  prochlorperazine (COMPAZINE) 10 MG tablet Take 1 tablet (10 mg total) by mouth every 6 (six) hours as needed for nausea or vomiting. 06/26/19   Magrinat, Valentino Hue, MD  triamcinolone (NASACORT) 55 MCG/ACT AERO nasal inhaler Place 2 sprays into the nose daily. 2 sprays each nostril at night before bedtime Patient taking differently: Place 2 sprays into the nose See admin instructions. Instill 2 sprays into each nostril every night before bedtime 10/14/20   Drema Halon, MD      Allergies    Other, Tape, Aminoglycosides, Bacitracin, Bee venom,  Cephalexin, Clindamycin/lincomycin, and Codeine    Review of Systems   Review of Systems  Neurological:  Positive for dizziness.  All other systems reviewed and are negative.   Physical Exam Updated Vital Signs BP 113/67   Pulse 92   Temp 98.3 F (36.8 C) (Oral)   Resp (!) 25   Ht 5\' 3"  (1.6 m)   Wt 61.2 kg   SpO2 98%   BMI 23.91 kg/m  Physical Exam Vitals and nursing note reviewed.  Constitutional:      General: She is not in acute distress.    Appearance: She is well-developed.   HENT:     Head: Atraumatic.  Eyes:     Conjunctiva/sclera: Conjunctivae normal.  Cardiovascular:     Rate and Rhythm: Normal rate and regular rhythm.     Pulses: Normal pulses.     Heart sounds: Normal heart sounds.  Pulmonary:     Effort: Pulmonary effort is normal.  Abdominal:     Palpations: Abdomen is soft.  Musculoskeletal:        General: Normal range of motion.     Cervical back: Normal range of motion and neck supple.     Comments: 5 out of 5 strength to all 4 extremities  Skin:    Findings: No rash.     Comments: Lymphedema involving left upper extremity  Neurological:     Mental Status: She is alert and oriented to person, place, and time.  Psychiatric:        Mood and Affect: Mood normal.     ED Results / Procedures / Treatments   Labs (all labs ordered are listed, but only abnormal results are displayed) Labs Reviewed  BASIC METABOLIC PANEL - Abnormal; Notable for the following components:      Result Value   Potassium 2.7 (*)    Glucose, Bld 111 (*)    BUN 29 (*)    Creatinine, Ser 1.83 (*)    Calcium 6.4 (*)    GFR, Estimated 27 (*)    All other components within normal limits  CBC - Abnormal; Notable for the following components:   RBC 2.53 (*)    Hemoglobin 8.3 (*)    HCT 25.3 (*)    All other components within normal limits  URINALYSIS, ROUTINE W REFLEX MICROSCOPIC - Abnormal; Notable for the following components:   APPearance CLOUDY (*)    Hgb urine dipstick MODERATE (*)    Protein, ur 100 (*)    Leukocytes,Ua LARGE (*)    Bacteria, UA FEW (*)    All other components within normal limits  MAGNESIUM - Abnormal; Notable for the following components:   Magnesium 0.5 (*)    All other components within normal limits  CBG MONITORING, ED    EKG None  Radiology No results found.  Procedures .Critical Care  Performed by: Fayrene Helper, PA-C Authorized by: Fayrene Helper, PA-C   Critical care provider statement:    Critical care time  (minutes):  45   Critical care was time spent personally by me on the following activities:  Development of treatment plan with patient or surrogate, discussions with consultants, evaluation of patient's response to treatment, examination of patient, ordering and review of laboratory studies, ordering and review of radiographic studies, ordering and performing treatments and interventions, pulse oximetry, re-evaluation of patient's condition and review of old charts     Medications Ordered in ED Medications  potassium chloride 10 mEq in 100 mL IVPB (10 mEq Intravenous New  Bag/Given 01/11/23 1422)  calcium gluconate 2 g/ 100 mL sodium chloride IVPB (has no administration in time range)  magnesium sulfate IVPB 2 g 50 mL (has no administration in time range)  potassium chloride SA (KLOR-CON M) CR tablet 40 mEq (40 mEq Oral Given 01/11/23 1305)  cefTRIAXone (ROCEPHIN) 1 g in sodium chloride 0.9 % 100 mL IVPB (0 g Intravenous Stopped 01/11/23 1422)    ED Course/ Medical Decision Making/ A&P                                 Medical Decision Making Amount and/or Complexity of Data Reviewed Labs: ordered.  Risk Prescription drug management.   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  Patient states for the past several weeks she has gone through quite a bit.  States she has had multiple hospitalization due to sepsis related to pneumonia and she is currently at a rehab facility for the past week.  This morning she got up and felt lightheadedness and dizziness with sensation of feeling like she is unsteady on her feet and felt like she was going to pass out.  She notified the staff and was brought here for further assessment.  EMS  noted the patient had positive orthostatic vital sign.  While at rest the patient states she is feeling fine.  She felt she may be a bit dehydrated as she is unable to drink enough fluid at the facility.  She denies any fever or chills no nausea vomiting or diarrhea no chest pain or trouble breathing no abdominal pain no urinary symptoms denies any severe headache or diplopia.  Exam overall reassuring patient is mentating appropriately she does not have any concerning nystagmus, strength is equal throughout.  She does have left upper extremity lymphedema which is not new.  Vital signs overall reassuring. Need to check O2 while ambulating  -Labs ordered, independently viewed and interpreted by me.  Labs remarkable for hypocalcemia, hypokalemia, hypomagnesemia, and UTI along with AKI and anemia -The patient was maintained on a cardiac monitor.  I personally viewed and interpreted the cardiac monitored which showed an underlying rhythm of: NSR -Imaging was not considered -This patient presents to the ED for concern of near syncope, this involves an extensive number of treatment options, and is a complaint that carries with it a high risk of complications and morbidity.  The differential diagnosis includes electrolytes imbalance, anemia, uti, cardiac arrhythmia, dehydration -Co morbidities that complicate the patient evaluation includes dvt, breast ca, copd, gerd, anemia -Treatment includes potassium, magnesium, and IVF, and rocephin -Reevaluation of the patient after these medicines showed that the patient improved -PCP office notes or outside notes reviewed -Discussion with specialist [facility] DR. Selinda Eon -Escalation to admission/observation considered: pt is agreeable with admission  Suspect electrolytes abnormalities are likely due to diuretic and lack of fluid intake. Will correct the potassium and magnesium.  Calcium will need to be corrected once potassium level is controlled.            Final Clinical Impression(s) / ED Diagnoses Final diagnoses:  Near syncope  Hypokalemia  AKI (acute kidney injury) (HCC)  Hypocalcemia  Hypomagnesemia  Lower urinary tract infectious disease    Rx / DC Orders ED Discharge Orders     None         Fayrene Helper, PA-C 01/11/23 1536    Ernie Avena, MD 01/11/23 [phone]

## 2023-01-11 NOTE — ED Notes (Signed)
ED TO INPATIENT HANDOFF REPORT  ED Nurse Name and Phone #: Thamas Jaegers Name/Age/Gender Stacey [name] 82 y.o. female Room/Bed: WA21/WA21  Code Status   Code Status: Full Code  Home/SNF/Other Skilled nursing facility; [facility] Patient oriented to: self, place, time, and situation Is this baseline? Yes   Triage Complete: Triage complete  Chief Complaint AKI (acute kidney injury) (HCC) [N17.9]  Triage Note BIB EMS from [facility] for lightheadedness and dizziness when standing. Pt had positive orthostatic vitals with EMS.    Allergies Allergies  Allergen Reactions   Other Nausea Only and Other (See Comments)    A lot of antibiotics cause extreme nausea   Tape Other (See Comments)    PAPER TAPE IS MUCH PREFERRED   Aminoglycosides Other (See Comments)    Unknown reaction - pt is not sure where this entry came from   Bacitracin Other (See Comments)    Reaction not recalled   Bee Venom Swelling and Other (See Comments)    Severe body swelling   Cephalexin Nausea Only   Clindamycin/Lincomycin Diarrhea and Nausea And Vomiting   Codeine Nausea And Vomiting and Other (See Comments)    Pt can take codeine cough syrup    Level of Care/Admitting Diagnosis ED Disposition     ED Disposition  Admit   Condition  --   Comment  Hospital Area: [facility] [[id number]]  Level of Care: Med-Surg [16]  May place patient in observation at [facility] or [facility] if equivalent level of care is available:: Yes  Covid Evaluation: Asymptomatic - no recent exposure (last 10 days) testing not required  Diagnosis: AKI (acute kidney injury) [facility]) [[id number]]  Admitting Physician: Maryln Gottron [[id number]]  Attending Physician: Kirby Crigler, MIR M [[id number]]          B Medical/Surgery History Past Medical History:  Diagnosis Date   ANEMIA-NOS    ANXIETY    Breast cancer (HCC) 1997 L, 2012 R   s/p chemo/xrt   COPD    resolved   DIVERTICULOSIS, COLON 2008    Dizziness    Family history of breast cancer    Family history of lung cancer    Family history of lymphoma    Family history of pancreatic cancer    Family history of uterine cancer    GERD    Hx of radiation therapy 10/19/11 -12/03/11   right breast   HYPERLIPIDEMIA    IRRITABLE BOWEL SYNDROME, HX OF    Left-sided carotid artery disease (HCC)    moderate left ICA stenosis   OSTEOARTHRITIS, HAND    PSVT (paroxysmal supraventricular tachycardia) (HCC)    symptomatic on event monitor   Past Surgical History:  Procedure Laterality Date   ABDOMINAL HYSTERECTOMY     APPENDECTOMY     BREAST BIOPSY  11/14/10    r breast: inv, insitu mammary carcinoma w/calcif, er/pr +, her2 -   BREAST SURGERY     lumpectomy   CATARACT EXTRACTION     both eyes   ELECTROPHYSIOLOGIC STUDY N/A 05/10/2015   Procedure: SVT Ablation;  Surgeon: Will Jorja Loa, MD;  [facility]: [facility] INVAS[facility] CV LAB;  Service: Cardiovascular;  Laterality: N/A;   HERNIA REPAIR     inguinal herniorrhapy  1984   left   IR US GUIDE BX ASP/DRAIN  05/26/2019   rectal fissure repair     s/p benign breast biopsy  2003   right   s/p left foot surgury  2009   s/p  lumpectomy  1997   melignant left x 2   spiral fx left foot  2008   no surgury   TMJ ARTHROPLASTY  1989   TONGUE SURGERY     1988- to remove scar tissue growth    TONSILLECTOMY       A IV Location/Drains/Wounds Patient Lines/Drains/Airways Status     Active Line/Drains/Airways     Name Placement date Placement time Site Days   Peripheral IV 01/11/23 20 G Right Antecubital 01/11/23  1154  Antecubital  less than 1            Intake/Output Last 24 hours  Intake/Output Summary (Last 24 hours) at 01/11/2023 1622 Last data filed at 01/11/2023 1621 Gross per 24 hour  Intake 350 ml  Output --  Net 350 ml    Labs/Imaging Results for orders placed or performed during the hospital encounter of 01/11/23 (from the past 48 hour(s))  Basic metabolic panel      Status: Abnormal   Collection Time: 01/11/23 11:49 AM  Result Value Ref Range   Sodium 138 135 - 145 mmol/L   Potassium 2.7 (LL) 3.5 - 5.1 mmol/L    Comment: CRITICAL RESULT CALLED TO, READ BACK BY AND VERIFIED WITH SPHEPEARD, H. RN AT 1252 ON 01/11/2023 ON MECIAL J.    Chloride 98 98 - 111 mmol/L   CO2 25 22 - 32 mmol/L   Glucose, Bld 111 (H) 70 - 99 mg/dL    Comment: Glucose reference range applies only to samples taken after fasting for at least 8 hours.   BUN 29 (H) 8 - 23 mg/dL   Creatinine, Ser 1.61 (H) 0.44 - 1.00 mg/dL   Calcium 6.4 (LL) 8.9 - 10.3 mg/dL    Comment: CRITICAL RESULT CALLED TO, READ BACK BY AND VERIFIED WITH SHEPEARD, H. RN AT 1252 ON 12/092024 BY MECIAL J.    GFR, Estimated 27 (L) >60 mL/min    Comment: (NOTE) Calculated using the CKD-EPI Creatinine Equation (2021)    Anion gap 15 5 - 15    Comment: Performed at [facility], [address] [place], [place] [address]  CBC     Status: Abnormal   Collection Time: 01/11/23 11:49 AM  Result Value Ref Range   WBC 9.7 4.0 - 10.5 K/uL   RBC 2.53 (L) 3.87 - 5.11 MIL/uL   Hemoglobin 8.3 (L) 12.0 - 15.0 g/dL   HCT 54.0 (L) 98.1 - 19.1 %   MCV 100.0 80.0 - 100.0 fL   MCH 32.8 26.0 - 34.0 pg   MCHC 32.8 30.0 - 36.0 g/dL   RDW 47.8 29.5 - 62.1 %   Platelets 328 150 - 400 K/uL   nRBC 0.0 0.0 - 0.2 %    Comment: Performed at [facility], [address] [place], [place] [address]  Magnesium     Status: Abnormal   Collection Time: 01/11/23 11:49 AM  Result Value Ref Range   Magnesium 0.5 (LL) 1.7 - 2.4 mg/dL    Comment: CRITICAL RESULT CALLED TO, READ BACK BY AND VERIFIED WITH Christeen Douglas RN AT 1419 01/11/23 Merceda Elks Performed at [facility], [address] [place], [place] [address]   Urinalysis, Routine w reflex microscopic -Urine, Clean Catch     Status: Abnormal   Collection Time: 01/11/23 12:38 PM  Result Value Ref Range   Color, Urine YELLOW  YELLOW   APPearance CLOUDY (A) CLEAR   Specific Gravity, Urine 1.009 1.005 -  1.030   pH 5.0 5.0 - 8.0   Glucose, UA NEGATIVE NEGATIVE mg/dL   Hgb urine dipstick MODERATE (A) NEGATIVE   Bilirubin Urine NEGATIVE NEGATIVE   Ketones, ur NEGATIVE NEGATIVE mg/dL   Protein, ur 469 (A) NEGATIVE mg/dL   Nitrite NEGATIVE NEGATIVE   Leukocytes,Ua LARGE (A) NEGATIVE   RBC / HPF 11-20 0 - 5 RBC/hpf   WBC, UA >50 0 - 5 WBC/hpf   Bacteria, UA FEW (A) NONE SEEN   Squamous Epithelial / HPF 0-5 0 - 5 /HPF   WBC Clumps PRESENT    Mucus PRESENT    Hyaline Casts, UA PRESENT     Comment: Performed at [facility], [address], [place], [place] [address]   *Note: Due to a large number of results and/or encounters for the requested time period, some results have not been displayed. A complete set of results can be found in Results Review.   No results found.  Pending Labs Unresulted Labs (From admission, onward)     Start     Ordered   01/12/23 0500  Comprehensive metabolic panel  Tomorrow morning,   R        01/11/23 1438   01/12/23 0500  CBC  Tomorrow morning,   R        01/11/23 1438   01/12/23 0500  Magnesium  Tomorrow morning,   R        01/11/23 1439            Vitals/Pain Today's Vitals   01/11/23 1530 01/11/23 1545 01/11/23 1550 01/11/23 1600  BP: 134/60   (!) 131/57  Pulse: (!) 104 99 94 95  Resp: (!) 26   (!) 26  Temp:      TempSrc:      SpO2: 93% (!) 88% 100% 99%  Weight:      Height:      PainSc:        Isolation Precautions No active isolations  Medications Medications  potassium chloride 10 mEq in 100 mL IVPB (10 mEq Intravenous New Bag/Given 01/11/23 1614)  apixaban (ELIQUIS) tablet 2.5 mg (has no administration in time range)  acetaminophen (TYLENOL) tablet 650 mg (has no administration in time range)    Or  acetaminophen (TYLENOL) suppository 650 mg (has no administration in time range)  traZODone (DESYREL) tablet 25 mg (has no  administration in time range)  ondansetron (ZOFRAN) tablet 4 mg (has no administration in time range)    Or  ondansetron (ZOFRAN) injection 4 mg (has no administration in time range)  albuterol (PROVENTIL) (2.5 MG/3ML) 0.083% nebulizer solution 2.5 mg (has no administration in time range)  0.9 %  sodium chloride infusion (has no administration in time range)  potassium chloride SA (KLOR-CON M) CR tablet 40 mEq (40 mEq Oral Given 01/11/23 1305)  cefTRIAXone (ROCEPHIN) 1 g in sodium chloride 0.9 % 100 mL IVPB (0 g Intravenous Stopped 01/11/23 1422)  calcium gluconate 2 g/ 100 mL sodium chloride IVPB (0 mg Intravenous Stopped 01/11/23 1621)  magnesium sulfate IVPB 2 g 50 mL (0 g Intravenous Stopped 01/11/23 1556)  sodium chloride 0.9 % bolus 1,000 mL (1,000 mLs Intravenous New Bag/Given 01/11/23 1443)    Mobility walks with device     Focused Assessments     R Recommendations: See Admitting Provider Note  Report given to:   Additional Notes:

## 2023-01-11 NOTE — Telephone Encounter (Signed)
 Left patient a vm regarding upcoming appointment

## 2023-01-11 NOTE — ED Notes (Signed)
During ortho VS, pt was able to do the lying (with struggle due to pt being unable to lay down flat w/o pain) and sitting.  When it was time for the standing portion, pt struggled with [facility], weakness, and light-headedness, was unable to continue. Pts pulse increased to 141 from 100 upon initially standing up and 186 PR when standing. Pt instantly started shaking severely and was unable to continue.

## 2023-01-11 NOTE — H&P (Signed)
History and Physical  Stacey [name] OZH:[id number] DOB: [birth year] DOA: 01/11/2023  PCP: [name], MD   Chief Complaint: Weakness, dizziness  HPI: Stacey [name] is a 82 y.o. female with medical history significant for metastatic breast cancer on chemotherapy, CHF with preserved EF, CKD stage IIIb, hypertension, DVT on Eliquis, chronic diarrhea, chronic anemia with multiple recent hospital admissions being admitted to the hospital once again with acute renal failure, hypokalemia, hypomagnesemia and orthostatic hypotension.  Overall patient says that she has been generally weak, but since being at facility says she has actually been eating and drinking well, denies any dysuria, urgency or frequency of urination.  As far as she knows, she is not on any new medications, when she was taking palbociclib for her breast cancer it was causing diarrhea, but she has not been on this medication since October since she has had multiple hospital admissions.  She denies any fevers, chills, vomiting.  Patient has been living at [facility] some community, had some dizziness and lightheadedness this morning when standing, so EMS was called and she was brought to the ER for evaluation.  Review of Systems: Please see HPI for pertinent positives and negatives. A complete 10 system review of systems are otherwise negative.  Past Medical History:  Diagnosis Date   ANEMIA-NOS    ANXIETY    Breast cancer (HCC) 1997 L, 2012 R   s/p chemo/xrt   COPD    resolved   DIVERTICULOSIS, COLON 2008   Dizziness    Family history of breast cancer    Family history of lung cancer    Family history of lymphoma    Family history of pancreatic cancer    Family history of uterine cancer    GERD    Hx of radiation therapy 10/19/11 -12/03/11   right breast   HYPERLIPIDEMIA    IRRITABLE BOWEL SYNDROME, HX OF    Left-sided carotid artery disease (HCC)    moderate left ICA stenosis   OSTEOARTHRITIS, HAND    PSVT (paroxysmal  supraventricular tachycardia) (HCC)    symptomatic on event monitor   Past Surgical History:  Procedure Laterality Date   ABDOMINAL HYSTERECTOMY     APPENDECTOMY     BREAST BIOPSY  11/14/10    r breast: inv, insitu mammary carcinoma w/calcif, er/pr +, her2 -   BREAST SURGERY     lumpectomy   CATARACT EXTRACTION     both eyes   ELECTROPHYSIOLOGIC STUDY N/A 05/10/2015   Procedure: SVT Ablation;  Surgeon: Will Jorja Loa, MD;  Location: [facility] INV[facility];  Service: Cardiovascular;  Laterality: N/A;   HERNIA REPAIR     inguinal herniorrhapy  1984   left   IR US GUIDE BX ASP/DRAIN  05/26/2019   rectal fissure repair     s/p benign breast biopsy  2003   right   s/p left foot surgury  2009   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   spiral fx left foot  2008   no surgury   TMJ ARTHROPLASTY  1989   TONGUE SURGERY     1988- to remove scar tissue growth    TONSILLECTOMY      Social History:  reports that she quit smoking about 7 years ago. Her smoking use included cigarettes. She started smoking about 61 years ago. She has a 54 pack-year smoking history. She has never used smokeless tobacco. She reports current alcohol use. She reports that she does not use  drugs.   Allergies  Allergen Reactions   Other Nausea Only and Other (See Comments)    A lot of antibiotics cause extreme nausea   Tape Other (See Comments)    PAPER TAPE IS MUCH PREFERRED   Aminoglycosides Other (See Comments)    Unknown reaction - pt is not sure where this entry came from   Bacitracin Other (See Comments)    Reaction not recalled   Bee Venom Swelling and Other (See Comments)    Severe body swelling   Cephalexin Nausea Only   Clindamycin/Lincomycin Diarrhea and Nausea And Vomiting   Codeine Nausea And Vomiting and Other (See Comments)    Pt can take codeine cough syrup    Family History  Problem Relation Age of Onset   Hypertension Mother    Stroke Mother    Colon polyps Mother    Diabetes  Mother    Pancreatic cancer Mother 90   Uterine cancer Mother 39   Lymphoma Brother        burkitts   Lung cancer Paternal Uncle    Lung cancer Maternal Grandmother 37       non-smoker   Lung cancer Maternal Grandfather    Breast cancer Cousin        maternal cousin, dx in her mid 43s   Brain cancer Cousin        maternal cousin's son; dx in his 72s   Testicular cancer Cousin        maternal cousin's son;    Breast cancer Cousin        paternal cousin; dx in her 78s   Breast cancer Cousin        paternal cousin's daughter; dx in 1s; neg genetic testing   Colon cancer Neg Hx    Esophageal cancer Neg Hx    Stomach cancer Neg Hx    Rectal cancer Neg Hx      Prior to Admission medications   Medication Sig Start Date End Date Taking? Authorizing Provider  anastrozole (ARIMIDEX) 1 MG tablet TAKE 1 TABLET(1 MG) BY MOUTH DAILY 12/23/22   Rachel Moulds, MD  apixaban (ELIQUIS) 2.5 MG TABS tablet Take 1 tablet (2.5 mg total) by mouth 2 (two) times daily. 01/03/23   Lanae Boast, MD  budesonide (PULMICORT) 0.25 MG/2ML nebulizer solution Take 2 mLs (0.25 mg total) by nebulization 2 (two) times daily for 5 days. 12/16/22 01/01/23  Pokhrel, Rebekah Chesterfield, MD  CALCIUM CARBONATE-VITAMIN D PO Take 1 tablet by mouth 2 (two) times daily.    [provider]  fulvestrant (FASLODEX) 250 MG/5ML injection Inject 250 mg into the muscle every 28 (twenty-eight) days. Patient not taking: Reported on 01/01/2023 11/23/19   [provider]  furosemide (LASIX) 40 MG tablet Take 1 tablet (40 mg total) by mouth daily as needed for 3 days for fluid or edema (dyspnea, weight gain of 3 lbs in 24 hours), THEN 1 tablet (40 mg total) daily. Patient not taking: Reported on 01/01/2023 12/29/22 01/31/23  Narda Bonds, MD  loratadine (CLARITIN) 10 MG tablet Take 10 mg by mouth every morning.    [provider]  omeprazole (PRILOSEC) 20 MG capsule Take 1 capsule by mouth daily Patient not taking:  Reported on 01/01/2023 09/14/22   [name], MD  palbociclib Ilda Foil) 100 MG tablet HOLD PER ONCOLOGIST. FOLLOW-UP WITH ONCOLOGIST PRIOR TO RESTARTING 12/28/22   Narda Bonds, MD  prochlorperazine (COMPAZINE) 10 MG tablet Take 1 tablet (10 mg total) by mouth every  6 (six) hours as needed for nausea or vomiting. 06/26/19   Magrinat, Valentino Hue, MD  triamcinolone (NASACORT) 55 MCG/ACT AERO nasal inhaler Place 2 sprays into the nose daily. 2 sprays each nostril at night before bedtime Patient taking differently: Place 2 sprays into the nose See admin instructions. Instill 2 sprays into each nostril every night before bedtime 10/14/20   Drema Halon, MD    Physical Exam: BP 113/79   Pulse 95   Temp 98.3 F (36.8 C) (Oral)   Resp 20   Ht 5\' 3"  (1.6 m)   Wt 61.2 kg   SpO2 92%   BMI 23.91 kg/m   General:  Alert, oriented, calm, in no acute distress, looks pale but euvolemic on exam Cardiovascular: RRR, no murmurs or rubs, no peripheral edema  Respiratory: clear to auscultation bilaterally, no wheezes, no crackles  Abdomen: soft, nontender, nondistended, normal bowel tones heard  Skin: dry, no rashes  Musculoskeletal: no joint effusions, normal range of motion  Psychiatric: appropriate affect, normal speech  Neurologic: extraocular muscles intact, clear speech, moving all extremities with intact sensorium         Labs on Admission:  Basic Metabolic Panel: Recent Labs  Lab 01/11/23 1149  NA 138  K 2.7*  CL 98  CO2 25  GLUCOSE 111*  BUN 29*  CREATININE 1.83*  CALCIUM 6.4*  MG 0.5*   Liver Function Tests: No results for input(s): "AST", "ALT", "ALKPHOS", "BILITOT", "PROT", "ALBUMIN" in the last 168 hours. No results for input(s): "LIPASE", "AMYLASE" in the last 168 hours. No results for input(s): "AMMONIA" in the last 168 hours. CBC: Recent Labs  Lab 01/11/23 1149  WBC 9.7  HGB 8.3*  HCT 25.3*  MCV 100.0  PLT 328   Cardiac Enzymes: No results for input(s):  "CKTOTAL", "CKMB", "CKMBINDEX", "TROPONINI" in the last 168 hours.  BNP (last 3 results) Recent Labs    12/04/22 1553 12/25/22 2137 12/30/22 1015  BNP 164.7* 222.9* 131.3*    ProBNP (last 3 results) No results for input(s): "PROBNP" in the last 8760 hours.  CBG: No results for input(s): "GLUCAP" in the last 168 hours.  Radiological Exams on Admission: No results found.  Assessment/Plan Stacey [name] is a 82 y.o. female with medical history significant for metastatic breast cancer on chemotherapy, CHF with preserved EF, CKD stage IIIb, hypertension, DVT on Eliquis, chronic diarrhea, chronic anemia with multiple recent hospital admissions being admitted to the hospital once again with acute renal failure, hypokalemia, hypomagnesemia and orthostatic hypotension.  Acute renal failure superimposed on CKD stage IIIb-unclear etiology, though she is on Lasix and does intermittently still have diarrhea.  Though it is improved when compared to when she was taking palbociclib.  Baseline creatinine is about 1.3 -Observation admission -Discontinue Lasix, avoid nephrotoxins -Hydrate gently with normal saline, avoid hypotension -Trend renal function with daily labs  Hypokalemia, hypomagnesemia-possibly due to diuretic, no significant history of GI loss though she does have intermittent diarrhea as mentioned above. -Replace magnesium and potassium -Follow electrolytes with daily labs  Orthostatic hypotension-likely due to dehydration as mentioned above, will hydrate gently with normal saline and recheck orthostatics in the morning  History of breast cancer-continue Arimidex, palbociclib on hold until she follows up with her oncologist  Chronic heart failure with preserved EF-appears euvolemic on exam today, without evidence of acute heart failure, monitor closely while off diuretic.  History of DVT-continue Eliquis     Code Status: Full Code  Consults called: None, her oncologist  Dr. Al Pimple  added to treatment team.  Admission status: Observation  Time spent: 59 minutes  Eduin Friedel Sharlette Dense MD [facility] Pager [phone]  If 7PM-7AM, please contact night-coverage [email or web address] Password [facility]  01/11/2023, 2:39 PM

## 2023-01-11 NOTE — ED Triage Notes (Signed)
BIB EMS from [facility] for lightheadedness and dizziness when standing. Pt had positive orthostatic vitals with EMS.

## 2023-01-11 NOTE — Plan of Care (Signed)
Patient is alert and oriented X4, admitted from [facility] for dizziness, shortness of breath when standing. Patient unable to stand and pivot to bed d/t SOB and tachycardia 115. Patient complaining of 3/10 right hip pain. Assessment completed, and patient resting in bed awaiting dinner tray. All admission questions completed and bed alarm on, bed at lowest position, VSS, telemetry initiated.  Problem: Education: Goal: Knowledge of General Education information will improve Description: Including pain rating scale, medication(s)/side effects and non-pharmacologic comfort measures Outcome: Progressing   Problem: Nutrition: Goal: Adequate nutrition will be maintained Outcome: Progressing   Problem: Coping: Goal: Level of anxiety will decrease Outcome: Progressing   Problem: Elimination: Goal: Will not experience complications related to bowel motility Outcome: Progressing   Problem: Safety: Goal: Ability to remain free from injury will improve Outcome: Progressing   Problem: Skin Integrity: Goal: Risk for impaired skin integrity will decrease Outcome: Progressing

## 2023-01-12 DIAGNOSIS — E785 Hyperlipidemia, unspecified: Secondary | ICD-10-CM | POA: Diagnosis present

## 2023-01-12 DIAGNOSIS — C50911 Malignant neoplasm of unspecified site of right female breast: Secondary | ICD-10-CM | POA: Diagnosis present

## 2023-01-12 DIAGNOSIS — Z87891 Personal history of nicotine dependence: Secondary | ICD-10-CM | POA: Diagnosis not present

## 2023-01-12 DIAGNOSIS — Z923 Personal history of irradiation: Secondary | ICD-10-CM | POA: Diagnosis not present

## 2023-01-12 DIAGNOSIS — N179 Acute kidney failure, unspecified: Secondary | ICD-10-CM | POA: Diagnosis present

## 2023-01-12 DIAGNOSIS — Z7951 Long term (current) use of inhaled steroids: Secondary | ICD-10-CM | POA: Diagnosis not present

## 2023-01-12 DIAGNOSIS — E876 Hypokalemia: Secondary | ICD-10-CM | POA: Diagnosis present

## 2023-01-12 DIAGNOSIS — N1831 Chronic kidney disease, stage 3a: Secondary | ICD-10-CM

## 2023-01-12 DIAGNOSIS — I5032 Chronic diastolic (congestive) heart failure: Secondary | ICD-10-CM | POA: Diagnosis present

## 2023-01-12 DIAGNOSIS — N390 Urinary tract infection, site not specified: Secondary | ICD-10-CM | POA: Diagnosis present

## 2023-01-12 DIAGNOSIS — I951 Orthostatic hypotension: Secondary | ICD-10-CM | POA: Diagnosis present

## 2023-01-12 DIAGNOSIS — K219 Gastro-esophageal reflux disease without esophagitis: Secondary | ICD-10-CM | POA: Diagnosis present

## 2023-01-12 DIAGNOSIS — Z7401 Bed confinement status: Secondary | ICD-10-CM | POA: Diagnosis not present

## 2023-01-12 DIAGNOSIS — N183 Chronic kidney disease, stage 3 unspecified: Secondary | ICD-10-CM | POA: Insufficient documentation

## 2023-01-12 DIAGNOSIS — N1832 Chronic kidney disease, stage 3b: Secondary | ICD-10-CM | POA: Diagnosis present

## 2023-01-12 DIAGNOSIS — I130 Hypertensive heart and chronic kidney disease with heart failure and stage 1 through stage 4 chronic kidney disease, or unspecified chronic kidney disease: Secondary | ICD-10-CM | POA: Diagnosis present

## 2023-01-12 DIAGNOSIS — F419 Anxiety disorder, unspecified: Secondary | ICD-10-CM | POA: Diagnosis present

## 2023-01-12 DIAGNOSIS — Z7901 Long term (current) use of anticoagulants: Secondary | ICD-10-CM | POA: Diagnosis not present

## 2023-01-12 DIAGNOSIS — D631 Anemia in chronic kidney disease: Secondary | ICD-10-CM | POA: Diagnosis present

## 2023-01-12 DIAGNOSIS — Z79811 Long term (current) use of aromatase inhibitors: Secondary | ICD-10-CM | POA: Diagnosis not present

## 2023-01-12 DIAGNOSIS — R531 Weakness: Secondary | ICD-10-CM | POA: Diagnosis not present

## 2023-01-12 DIAGNOSIS — Z8249 Family history of ischemic heart disease and other diseases of the circulatory system: Secondary | ICD-10-CM | POA: Diagnosis not present

## 2023-01-12 DIAGNOSIS — Z86718 Personal history of other venous thrombosis and embolism: Secondary | ICD-10-CM | POA: Diagnosis not present

## 2023-01-12 DIAGNOSIS — R42 Dizziness and giddiness: Secondary | ICD-10-CM | POA: Diagnosis not present

## 2023-01-12 DIAGNOSIS — K529 Noninfective gastroenteritis and colitis, unspecified: Secondary | ICD-10-CM | POA: Diagnosis present

## 2023-01-12 DIAGNOSIS — D630 Anemia in neoplastic disease: Secondary | ICD-10-CM | POA: Diagnosis present

## 2023-01-12 DIAGNOSIS — E860 Dehydration: Secondary | ICD-10-CM | POA: Diagnosis present

## 2023-01-12 LAB — CBC
HCT: 22.7 % — ABNORMAL LOW (ref 36.0–46.0)
Hemoglobin: 7.2 g/dL — ABNORMAL LOW (ref 12.0–15.0)
MCH: 32.3 pg (ref 26.0–34.0)
MCHC: 31.7 g/dL (ref 30.0–36.0)
MCV: 101.8 fL — ABNORMAL HIGH (ref 80.0–100.0)
Platelets: 278 10*3/uL (ref 150–400)
RBC: 2.23 MIL/uL — ABNORMAL LOW (ref 3.87–5.11)
RDW: 15.5 % (ref 11.5–15.5)
WBC: 8 10*3/uL (ref 4.0–10.5)
nRBC: 0 % (ref 0.0–0.2)

## 2023-01-12 LAB — COMPREHENSIVE METABOLIC PANEL
ALT: 10 U/L (ref 0–44)
AST: 19 U/L (ref 15–41)
Albumin: 2.6 g/dL — ABNORMAL LOW (ref 3.5–5.0)
Alkaline Phosphatase: 44 U/L (ref 38–126)
Anion gap: 13 (ref 5–15)
BUN: 25 mg/dL — ABNORMAL HIGH (ref 8–23)
CO2: 22 mmol/L (ref 22–32)
Calcium: 6.3 mg/dL — CL (ref 8.9–10.3)
Chloride: 105 mmol/L (ref 98–111)
Creatinine, Ser: 1.69 mg/dL — ABNORMAL HIGH (ref 0.44–1.00)
GFR, Estimated: 30 mL/min — ABNORMAL LOW (ref >60)
Glucose, Bld: 98 mg/dL (ref 70–99)
Potassium: 2.9 mmol/L — ABNORMAL LOW (ref 3.5–5.1)
Sodium: 140 mmol/L (ref 135–145)
Total Bilirubin: 0.4 mg/dL (ref <1.2)
Total Protein: 5.8 g/dL — ABNORMAL LOW (ref 6.5–8.1)

## 2023-01-12 LAB — BASIC METABOLIC PANEL
Anion gap: 15 (ref 5–15)
BUN: 24 mg/dL — ABNORMAL HIGH (ref 8–23)
CO2: 19 mmol/L — ABNORMAL LOW (ref 22–32)
Calcium: 7 mg/dL — ABNORMAL LOW (ref 8.9–10.3)
Chloride: 101 mmol/L (ref 98–111)
Creatinine, Ser: 1.5 mg/dL — ABNORMAL HIGH (ref 0.44–1.00)
GFR, Estimated: 35 mL/min — ABNORMAL LOW (ref >60)
Glucose, Bld: 197 mg/dL — ABNORMAL HIGH (ref 70–99)
Potassium: 3.2 mmol/L — ABNORMAL LOW (ref 3.5–5.1)
Sodium: 135 mmol/L (ref 135–145)

## 2023-01-12 LAB — MAGNESIUM
Magnesium: 0.9 mg/dL — CL (ref 1.7–2.4)
Magnesium: 2.2 mg/dL (ref 1.7–2.4)

## 2023-01-12 LAB — PHOSPHORUS: Phosphorus: 3.6 mg/dL (ref 2.5–4.6)

## 2023-01-12 MED ORDER — CALCIUM GLUCONATE-NACL 2-0.675 GM/100ML-% IV SOLN
2.0000 g | Freq: Once | INTRAVENOUS | Status: AC
  Administered 2023-01-12: 2000 mg via INTRAVENOUS
  Filled 2023-01-12: qty 100

## 2023-01-12 MED ORDER — MAGNESIUM SULFATE 4 GM/100ML IV SOLN
4.0000 g | Freq: Once | INTRAVENOUS | Status: AC
  Administered 2023-01-12: 4 g via INTRAVENOUS
  Filled 2023-01-12: qty 100

## 2023-01-12 MED ORDER — POTASSIUM CHLORIDE CRYS ER 20 MEQ PO TBCR
40.0000 meq | EXTENDED_RELEASE_TABLET | ORAL | Status: AC
  Administered 2023-01-12 (×2): 40 meq via ORAL
  Filled 2023-01-12 (×2): qty 2

## 2023-01-12 MED ORDER — SODIUM CHLORIDE 0.9 % IV SOLN: INTRAVENOUS | Status: AC

## 2023-01-12 MED ORDER — ZOLPIDEM TARTRATE 5 MG PO TABS
5.0000 mg | ORAL_TABLET | Freq: Every evening | ORAL | Status: DC | PRN
  Administered 2023-01-13: 5 mg via ORAL
  Filled 2023-01-12: qty 1

## 2023-01-12 MED ORDER — PANTOPRAZOLE SODIUM 40 MG PO TBEC
40.0000 mg | DELAYED_RELEASE_TABLET | Freq: Every day | ORAL | Status: DC
  Administered 2023-01-12 – 2023-01-13 (×2): 40 mg via ORAL
  Filled 2023-01-12 (×2): qty 1

## 2023-01-12 MED ORDER — FLUTICASONE PROPIONATE 50 MCG/ACT NA SUSP
2.0000 | Freq: Every day | NASAL | Status: DC
  Administered 2023-01-13: 2 via NASAL
  Filled 2023-01-12: qty 16

## 2023-01-12 MED ORDER — LOPERAMIDE HCL 2 MG PO CAPS: 2.0000 mg | ORAL_CAPSULE | ORAL | Status: DC | PRN

## 2023-01-12 MED ORDER — ANASTROZOLE 1 MG PO TABS
1.0000 mg | ORAL_TABLET | Freq: Every day | ORAL | Status: DC
Start: 2023-01-12 — End: 2023-01-13
  Administered 2023-01-12 – 2023-01-13 (×2): 1 mg via ORAL
  Filled 2023-01-12 (×2): qty 1

## 2023-01-12 NOTE — Evaluation (Signed)
Occupational Therapy Evaluation Patient Details Name: Stacey [name] MRN: [id number] DOB: 04/24/1940 Today's Date: 01/12/2023   History of Present Illness Pt is a 82 y/o female presenting on 11/28 with AMS from [facility]. Noted most recent hospital admission for CHF exacerbation, but several recent admissions.  Pt with hx including but not limited to breast CA with bone mets on chronic immunotherapy, L UE lymphedema, OA, DVT, chronic diarrhea, anemia.   Clinical Impression   Patient is a 82 year old female who was admitted for above. Patient was living at home alone prior level. Currently patient has had extended time away from home with multiple hospitalizations in last two months. Patient was CGA for bed mobility with education provided on strategies to increase time out of bed here and at SNF. Patient was noted to have decreased functional activity tolerance, decreased endurance, decreased standing balance, decreased safety awareness, and decreased knowledge of AD/AE impacting participation in ADLs. Patient will benefit from continued inpatient follow up therapy, <3 hours/day. Patient would continue to benefit from skilled OT services at this time while admitted and after d/c to address noted deficits in order to improve overall safety and independence in ADLs.    Blood pressure during session: BP 106/47 semi reclined Bp seated EOB 118/48 Bp standing 105/52 no reports of dizziness           If plan is discharge home, recommend the following: A little help with walking and/or transfers;Assistance with cooking/housework;Help with stairs or ramp for entrance;Assist for transportation;A little help with bathing/dressing/bathroom    Functional Status Assessment  Patient has had a recent decline in their functional status and demonstrates the ability to make significant improvements in function in a reasonable and predictable amount of time.  Equipment Recommendations  None recommended by  OT       Precautions / Restrictions Precautions Precautions: Fall Precaution Comments: watch O2/2 L/min at eval Restrictions Weight Bearing Restrictions: No      Mobility Bed Mobility Overal bed mobility: Needs Assistance Bed Mobility: Supine to Sit     Supine to sit: Supervision     General bed mobility comments: No physical assist, HOB elevated and increased time          Balance Overall balance assessment: Needs assistance Sitting-balance support: No upper extremity supported, Feet supported Sitting balance-Leahy Scale: Good     Standing balance support: During functional activity, Single extremity supported Standing balance-Leahy Scale: Poor             ADL either performed or assessed with clinical judgement   ADL Overall ADL's : Needs assistance/impaired Eating/Feeding: Independent;Sitting   Grooming: Set up;Sitting   Upper Body Bathing: Minimal assistance;Sitting   Lower Body Bathing: Sitting/lateral leans;Moderate assistance   Upper Body Dressing : Set up;Sitting   Lower Body Dressing: Minimal assistance;Sit to/from stand   Toilet Transfer: Minimal assistance;Ambulation   Toileting- Clothing Manipulation and Hygiene: Maximal assistance;Sit to/from stand       Functional mobility during ADLs: Minimal assistance;Rolling walker (2 wheels);Cueing for safety       Vision Baseline Vision/History: 1 Wears glasses              Pertinent Vitals/Pain Pain Assessment Pain Assessment: No/denies pain     Extremity/Trunk Assessment Upper Extremity Assessment Upper Extremity Assessment: RUE deficits/detail;LUE deficits/detail RUE Deficits / Details: WFL, chronic arthritic change in fingers RUE Coordination: decreased fine motor LUE Deficits / Details: Chronic edema due to reported lymphedema. Chronic shoulder AROM limitations. Functional grip strength.  Currently wearing arm sleeve LUE Coordination: decreased fine motor   Lower Extremity  Assessment Lower Extremity Assessment: Defer to PT evaluation   Cervical / Trunk Assessment Cervical / Trunk Assessment: Normal   Communication Communication Communication: No apparent difficulties   Cognition Arousal: Alert Behavior During Therapy: WFL for tasks assessed/performed Overall Cognitive Status: Within Functional Limits for tasks assessed         General Comments: oriented and appropraite throughout session. patient noted to have particular way to complete things during session     General Comments  2 L/min desaturated to 88% with gait tasks < 8s to recover to 90% once seated on 2 L/min            Home Living Family/patient expects to be discharged to:: Skilled nursing facility Living Arrangements: Alone Available Help at Discharge: Family;Available PRN/intermittently Type of Home: House Home Access: Stairs to enter [employer] of Steps: 1   Home Layout: One level     Bathroom Shower/Tub: Chief Strategy Officer: Standard     Home Equipment: BSC/3in1;Shower seat;Rolling Environmental consultant (2 wheels)   Additional Comments: Pt has been in [place] rehab but back and forth to hospital      Prior Functioning/Environment Prior Level of Function : Needs assist             Mobility Comments: using RW, independent prior to hopistalizations starting in Oct ADLs Comments: some assist with ADLs as needed, was independnet prior to admission in oct        OT Problem List: Decreased strength;Decreased activity tolerance;Impaired balance (sitting and/or standing);Decreased knowledge of use of DME or AE;Cardiopulmonary status limiting activity      OT Treatment/Interventions: Self-care/ADL training;Energy conservation;DME and/or AE instruction;Therapeutic activities;Patient/family education;Balance training    OT Goals(Current goals can be found in the care plan section) Acute Rehab OT Goals Patient Stated Goal: wants to get back home OT Goal  Formulation: With patient Time For Goal Achievement: 01/26/23 Potential to Achieve Goals: Fair  OT Frequency: Min 1X/week    Co-evaluation   Reason for Co-Treatment: To address functional/ADL transfers PT goals addressed during session: Mobility/safety with mobility OT goals addressed during session: ADL's and self-care      AM-PAC OT "6 Clicks" Daily Activity     Outcome Measure Help from another person eating meals?: None Help from another person taking care of personal grooming?: A Little Help from another person toileting, which includes using toliet, bedpan, or urinal?: A Little Help from another person bathing (including washing, rinsing, drying)?: A Little Help from another person to put on and taking off regular upper body clothing?: A Little Help from another person to put on and taking off regular lower body clothing?: A Little 6 Click Score: 19   End of Session Equipment Utilized During Treatment: Oxygen;Rolling walker (2 wheels) Nurse Communication: Mobility status  Activity Tolerance: Patient tolerated treatment well Patient left: in chair;with call bell/phone within reach  OT Visit Diagnosis: Muscle weakness (generalized) (M62.81);Unsteadiness on feet (R26.81)                Time: 2841-3244 OT Time Calculation (min): 34 min Charges:  OT General Charges $OT Visit: 1 Visit OT Evaluation $OT Eval Low Complexity: 1 Low  Rayshell Goecke OTR/L, MS Acute Rehabilitation Department Office# [phone]   Selinda Flavin 01/12/2023, 5:11 PM

## 2023-01-12 NOTE — Plan of Care (Signed)

## 2023-01-12 NOTE — Evaluation (Signed)
Physical Therapy Evaluation Patient Details Name: Stacey [name] MRN: [id number] DOB: [birth year] Today's Date: 01/12/2023  History of Present Illness  Pt is a 82 y/o female presenting on 11/28 with AMS from [facility]. Noted most recent hospital admission for CHF exacerbation, but several recent admissions.  Pt with hx including but not limited to breast CA with bone mets on chronic immunotherapy, L UE lymphedema, OA, DVT, chronic diarrhea, anemia.  Clinical Impression      Pt admitted with above diagnosis.  Pt currently with functional limitations due to the deficits listed below (see PT Problem List). Pt in bed when PT arrived. Pt agreeable to therapy intervention and eager to get OOB. Pt required S and min cues, sit to stand  from EOB with CGA and cues, CGA for standing balance at RW with 1 UE support, gait tasks with RW, CGA for 100 and an additional 60 feet. Pt desaturated to 88% briefly on 2 L/min with exertion. Pt left seated in recliner, all needs in place. Pt is to [facility] to [facility] for continued rehab services. Pt will benefit from acute skilled PT to increase their independence and safety with mobility to allow discharge.   BP 106/47 semi reclined Bp seated EOB 118/48 Bp standing 105/52 no reports of dizziness     If plan is discharge home, recommend the following: Assistance with cooking/housework;Help with stairs or ramp for entrance;A little help with walking and/or transfers;A little help with bathing/dressing/bathroom   Can travel by private vehicle   No    Equipment Recommendations None recommended by PT  Recommendations for Other Services       Functional Status Assessment Patient has had a recent decline in their functional status and demonstrates the ability to make significant improvements in function in a reasonable and predictable amount of time.     Precautions / Restrictions Precautions Precautions: Fall Precaution Comments: watch O2/2 L/min at  eval Restrictions Weight Bearing Restrictions: No      Mobility  Bed Mobility Overal bed mobility: Needs Assistance Bed Mobility: Supine to Sit     Supine to sit: Supervision     General bed mobility comments: No physical assist, HOB elevated and increased time    Transfers Overall transfer level: Needs assistance Equipment used: Rolling walker (2 wheels) Transfers: Sit to/from Stand Sit to Stand: Contact guard assist           General transfer comment: min cues and increased time with B UE support for balance    Ambulation/Gait Ambulation/Gait assistance: Min assist, Contact guard assist Gait Distance (Feet): 100 Feet Assistive device: Rolling walker (2 wheels) Gait Pattern/deviations: Decreased stride length, Step-through pattern, Shuffle Gait velocity: decreased     General Gait Details: cues for posture, position from RW and to decrease pace for safety, pursed lip breathing and energy conservation. recliner close and pt reporting B LE instabiltiy  Stairs            Wheelchair Mobility     Tilt Bed    Modified Rankin (Stroke Patients Only)       Balance Overall balance assessment: Needs assistance Sitting-balance support: No upper extremity supported, Feet supported Sitting balance-Leahy Scale: Good     Standing balance support: During functional activity, Single extremity supported Standing balance-Leahy Scale: Poor Standing balance comment: static standing with 1 UE support and CGA at [facility]  Pertinent Vitals/Pain Pain Assessment Pain Assessment: No/denies pain    Home Living Family/patient expects to be discharged to:: Skilled nursing facility Living Arrangements: Alone Available Help at Discharge: Family;Available PRN/intermittently Type of Home: House Home Access: Stairs to enter   Entrance Stairs-Number of Steps: 1   Home Layout: One level Home Equipment: BSC/3in1;Shower seat;Rolling Environmental consultant  (2 wheels) Additional Comments: Pt has been in [facility] rehab but back and forth to hospital    Prior Function Prior Level of Function : Needs assist             Mobility Comments: using RW, independent prior to hopistalizations starting in Oct ADLs Comments: some assist with ADLs as needed, was independnet prior to admission in oct     Extremity/Trunk Assessment   Upper Extremity Assessment Upper Extremity Assessment: Defer to OT evaluation    Lower Extremity Assessment Lower Extremity Assessment: Generalized weakness    Cervical / Trunk Assessment Cervical / Trunk Assessment: Normal  Communication   Communication Communication: No apparent difficulties  Cognition Arousal: Alert Behavior During Therapy: WFL for tasks assessed/performed Overall Cognitive Status: Within Functional Limits for tasks assessed                                 General Comments: oriented and appropraite throughout session        General Comments General comments (skin integrity, edema, etc.): 2 L/min desaturated to 88% with gait tasks < 8s to recover to 90% once seated on 2 L/min    Exercises     Assessment/Plan    PT Assessment Patient needs continued PT services  PT Problem List Decreased strength;Cardiopulmonary status limiting activity;Decreased range of motion;Decreased activity tolerance;Decreased balance;Decreased knowledge of use of DME;Decreased safety awareness;Decreased mobility       PT Treatment Interventions DME instruction;Therapeutic exercise;Gait training;Balance training;Neuromuscular re-education;Functional mobility training;Therapeutic activities;Patient/family education    PT Goals (Current goals can be found in the Care Plan section)  Acute Rehab PT Goals Patient Stated Goal: return home PT Goal Formulation: With patient Time For Goal Achievement: 01/26/23 Potential to Achieve Goals: Good    Frequency Min 1X/week     Co-evaluation  PT/OT/SLP Co-Evaluation/Treatment: Yes Reason for Co-Treatment: To address functional/ADL transfers PT goals addressed during session: Mobility/safety with mobility OT goals addressed during session: ADL's and self-care       AM-PAC PT "6 Clicks" Mobility  Outcome Measure Help needed turning from your back to your side while in a flat bed without using bedrails?: A Little Help needed moving from lying on your back to sitting on the side of a flat bed without using bedrails?: A Little Help needed moving to and from a bed to a chair (including a wheelchair)?: A Little Help needed standing up from a chair using your arms (e.g., wheelchair or bedside chair)?: A Little Help needed to walk in hospital room?: A Little Help needed climbing 3-5 steps with a railing? : A Lot 6 Click Score: 17    End of Session Equipment Utilized During Treatment: Gait belt;Oxygen Activity Tolerance: Patient tolerated treatment well Patient left: in chair;with nursing/sitter in room;with call bell/phone within reach Nurse Communication: Mobility status PT Visit Diagnosis: Other abnormalities of gait and mobility (R26.89);Muscle weakness (generalized) (M62.81)    Time: 4010-2725 PT Time Calculation (min) (ACUTE ONLY): 24 min   Charges:   PT Evaluation $PT Eval Low Complexity: 1 Low PT Treatments $Gait Training: 8-22 mins PT General Charges $$  ACUTE PT VISIT: 1 Visit         Johnny Bridge, PT Acute Rehab   Jacqualyn Posey 01/12/2023, 4:38 PM

## 2023-01-12 NOTE — Plan of Care (Signed)
  Problem: Activity: Goal: Risk for activity intolerance will decrease Outcome: Progressing   Problem: Nutrition: Goal: Adequate nutrition will be maintained Outcome: Progressing   Problem: Elimination: Goal: Will not experience complications related to bowel motility Outcome: Progressing Goal: Will not experience complications related to urinary retention Outcome: Progressing   Problem: Safety: Goal: Ability to remain free from injury will improve Outcome: Progressing

## 2023-01-12 NOTE — Progress Notes (Signed)
Progress Note   Patient: Stacey [name]:[id number] DOB: 06-04-1940 DOA: 01/11/2023     0 DOS: the patient was seen and examined on 01/12/2023   Brief hospital course: 82 year old woman PMH including metastatic breast cancer, diastolic CHF, CKD stage IIIb, DVT on apixaban, chronic diarrhea, with multiple hospitalizations recently as detailed below who presented with reportedly low blood pressure and dizziness.  She was admitted for AKI, hypokalemia, hypomagnesemia, hypocalcemia, orthostatic hypotension.  She does not drink enough fluid she acknowledges but does eat well.  She has daily diarrhea from her breast cancer medication.   Consultants None  Procedures/Events 12/9 admit  Assessment and Plan: Stacey [name] is a 82 y.o. female with medical history significant for metastatic breast cancer on chemotherapy, CHF with preserved EF, CKD stage IIIb, hypertension, DVT on Eliquis, chronic diarrhea, chronic anemia with multiple recent hospital admissions being admitted to the hospital once again with acute renal failure, hypokalemia, hypomagnesemia and orthostatic hypotension.   AKI superimposed on CKD stage IIIb Dehydration This is her third admission in the last 3 weeks and her second for AKI.   She reports she does not drink enough fluids and has daily diarrhea.  She does report eating well.  She is not taking Lasix despite the medical reconciliation.   Creatinine trending down 1.69, baseline around 1.2. IV fluids.  Renal ultrasound recently was unremarkable.  She saw nephrology in November while hospitalized. Suspect diarrhea and poor fluid intake as etiology.  Not on any obvious nephrotoxins.  Hypokalemia Hypomagnesemia Hypocalcemia Suspect related to diarrhea, poor solute absorption Replete Probably needs to be on daily repletion as an outpatient   Orthostatic hypotension Secondary to AKI and poor oral intake.  Anemia of malignancy Close to November values.  Monitor with CBC in  AM.  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 difficile but she is immunosuppressed and this has not been tested. She is on Arimidex which has a listed side effect of 5% diarrhea   Chronic diastolic CHF No lower extremity edema.  Monitor clinically.  Not on Lasix.   PMH DVT Continue Eliquis  Hospitalized 11/22 - 11/26, presented with exertional dyspnea, admitted for acute on chronic diastolic CHF, acute on chronic respiratory failure, hypocalcemia, hypomagnesemia, hypokalemia. Hospitalized 11/27 - 12/1 admitted for AKI and weakness.  Urinary retention.    Subjective:  Feels okay, no complaints.  Does not drink enough fluids which she acknowledges but reports she is eating well.  Does not take Lasix.  On some, blood pressure pill which she does not know the name of.  Takes the medication for chemotherapy which gives her diarrhea every day.  Physical Exam: Vitals:   01/11/23 1600 01/11/23 1713 01/11/23 2041 01/12/23 0534  BP: (!) 131/57 124/60 (!) 113/46 (!) 92/42  Pulse: 95 (!) 108  82  Resp: (!) 26 18 20 18   Temp:  99.2 F (37.3 C) 98.4 F (36.9 C) 98 F (36.7 C)  TempSrc:  Oral Oral Oral  SpO2: 99% 99% 96% 95%  Weight:      Height:       Physical Exam Vitals reviewed.  Constitutional:      General: She is not in acute distress.    Appearance: She is not ill-appearing or toxic-appearing.  Cardiovascular:     Rate and Rhythm: Normal rate and regular rhythm.     Heart sounds: No murmur heard. Pulmonary:     Effort: Pulmonary effort  is normal. No respiratory distress.     Breath sounds: No wheezing, rhonchi or rales.  Neurological:     Mental Status: She is alert.  Psychiatric:        Mood and Affect: Mood normal.        Behavior: Behavior normal.     Data Reviewed: Potassium 2.9, creatinine trending down at 1.69, baseline around 1.2.  Calcium 6.3, magnesium 0.9.  Hemoglobin down to 7.2 which appears close  to November values.  Chest x-ray no acute disease.  EKG nonacute.  Family Communication: none  Disposition: Status is: Inpatient     Time spent: 35 minutes  Author: Brendia Sacks, MD 01/12/2023 12:10 PM  For on call review [email or web address].

## 2023-01-13 ENCOUNTER — Other Ambulatory Visit: Payer: Self-pay

## 2023-01-13 DIAGNOSIS — N1831 Chronic kidney disease, stage 3a: Secondary | ICD-10-CM | POA: Diagnosis not present

## 2023-01-13 DIAGNOSIS — I951 Orthostatic hypotension: Secondary | ICD-10-CM | POA: Diagnosis not present

## 2023-01-13 DIAGNOSIS — D630 Anemia in neoplastic disease: Secondary | ICD-10-CM | POA: Diagnosis not present

## 2023-01-13 DIAGNOSIS — N179 Acute kidney failure, unspecified: Secondary | ICD-10-CM | POA: Diagnosis not present

## 2023-01-13 LAB — COMPREHENSIVE METABOLIC PANEL
ALT: 11 U/L (ref 0–44)
AST: 20 U/L (ref 15–41)
Albumin: 2.7 g/dL — ABNORMAL LOW (ref 3.5–5.0)
Alkaline Phosphatase: 45 U/L (ref 38–126)
Anion gap: 9 (ref 5–15)
BUN: 21 mg/dL (ref 8–23)
CO2: 20 mmol/L — ABNORMAL LOW (ref 22–32)
Calcium: 7.6 mg/dL — ABNORMAL LOW (ref 8.9–10.3)
Chloride: 107 mmol/L (ref 98–111)
Creatinine, Ser: 1.32 mg/dL — ABNORMAL HIGH (ref 0.44–1.00)
GFR, Estimated: 40 mL/min — ABNORMAL LOW (ref >60)
Glucose, Bld: 103 mg/dL — ABNORMAL HIGH (ref 70–99)
Potassium: 4.1 mmol/L (ref 3.5–5.1)
Sodium: 136 mmol/L (ref 135–145)
Total Bilirubin: 0.4 mg/dL (ref <1.2)
Total Protein: 6.1 g/dL — ABNORMAL LOW (ref 6.5–8.1)

## 2023-01-13 LAB — PHOSPHORUS: Phosphorus: 2.9 mg/dL (ref 2.5–4.6)

## 2023-01-13 LAB — MAGNESIUM: Magnesium: 2 mg/dL (ref 1.7–2.4)

## 2023-01-13 NOTE — Discharge Summary (Signed)
Physician Discharge Summary  Stacey [name] BMW:[id number] DOB: 01/21/1941 DOA: 01/11/2023  PCP: Eloisa Northern, MD  Admit date: 01/11/2023 Discharge date: 01/13/2023  Admitted From: Skilled nursing facility  Discharge disposition: Skilled nursing facility   Recommendations for Outpatient Follow-Up:   Follow up with your primary care provider at the skilled nursing facility in 3 to 5 days Check CBC, BMP, magnesium in the next visit Follow-up with your oncologist as scheduled by the clinic.  Discharge Diagnosis:   Principal Problem:   AKI (acute kidney injury) (HCC) Active Problems:   Anemia   Hypomagnesemia   Hypocalcemia   DVT (deep venous thrombosis) (HCC)   Diarrhea   Hypokalemia   CKD (chronic kidney disease), stage III (HCC)   Orthostatic hypotension    Discharge Condition: Improved.  Diet recommendation:  Regular.  Increase hydration.  Wound care: None.  Code status: Full.   History of Present Illness:   82 year old female with past medical history of metastatic breast cancer, diastolic congestive heart failure  CKD stage IIIb, DVT on apixaban, chronic diarrhea, with multiple hospitalizations r presented to hospital this time with dizziness and was noted to be hypotensive.  She was then admitted hospital for  AKI, hypokalemia, hypomagnesemia, hypocalcemia, orthostatic hypotension.  She endorses having daily diarrhea from breast cancer medication with decreased oral fluid intake.    Hospital Course:   Following conditions were addressed during hospitalization as listed below,  AKI  on CKD stage IIIb secondary to volume depletion.  Patient did have recurrent admission for the same recurrent admission in the last 3 weeks and her second for AKI.  Has daily diarrhea with decreased oral fluid intake.  Initial creatinine was 1.8, baseline creatinine around 1.2.  Received IV fluids.  Renal ultrasound recently done was unremarkable.  Creatinine today at 1.3.  Encourage  oral hydration.  Renal function at baseline at this time.  Patient states that she feels at her baseline and strongly wishes to go back to her skilled nursing facility today.  Hypokalemia Replenished and improved.  Latest potassium of 4.1.  Recommend check-in as outpatient.  Hypomagnesemia Latest magnesium of 2.0.  Improved after replacement.  Initial magnesium of 0.5.  Orthostatic hypotension Secondary to AKI and poor oral intake.  Will continue to monitor closely while in hospital IV fluids and encourage oral hydration.  No further orthostatic blood pressure noted at this time.   Anemia of malignancy Last hemoglobin of to 7.2.  No obvious signs of bleeding.  Recommend to follow-up with outpatient.   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 On Arimidex which can cause diarrhea as well.  Will continue to hold  Ibrance on discharge.   Chronic diastolic CHF Appears compensated.  On Lasix at home as necessary.  Has been able to ambulate.   History of DVT Continue Eliquis   Disposition.  At this time, patient is stable for disposition back to skilled nursing facility with outpatient oncology follow-up.  Medical Consultants:   None.  Procedures:    None Subjective:   Today, patient was seen and examined at bedside.  Feels better.  No nausea vomiting abdominal pain or diarrhea.  Has ambulated without any issues.  Wishes to strongly return back to her facility.  Discharge Exam:   Vitals:   01/13/23 0605 01/13/23 0608  BP: (!) 114/56 109/60  Pulse: 90 (!) 108  Resp:    Temp:    SpO2: 99% 97%   Vitals:   01/12/23  2040 01/13/23 0604 01/13/23 0605 01/13/23 0608  BP: (!) 110/96 (!) 108/42 (!) 114/56 109/60  Pulse: 94 87 90 (!) 108  Resp: 18     Temp: 98.3 F (36.8 C) 98.1 F (36.7 C)    TempSrc: Oral Oral    SpO2: 99% 98% 99% 97%  Weight:      Height:       Body mass index is 23.91 kg/m.  General: Alert awake, not in obvious distress, on  nasal cannula oxygen HENT: pupils equally reacting to light,  No scleral pallor or icterus noted. Oral mucosa is moist.  Chest:    Diminished breath sounds bilaterally. No crackles or wheezes.  CVS: S1 &S2 heard. No murmur.  Regular rate and rhythm. Abdomen: Soft, nontender, nondistended.  Bowel sounds are heard.   Extremities: No cyanosis, clubbing o with some tenderness over the bilateral lower extremities.  Peripheral pulses are palpable. Psych: Alert, awake and oriented, normal mood CNS:  No cranial nerve deficits.  Power equal in all extremities.   Skin: Warm and dry.  No rashes noted.  The results of significant diagnostics from this hospitalization (including imaging, microbiology, ancillary and laboratory) are listed below for reference.     Diagnostic Studies:   DG CHEST PORT 1 VIEW  Result Date: 01/11/2023 CLINICAL DATA:  Dyspnea EXAM: PORTABLE CHEST 1 VIEW COMPARISON:  Chest radiograph dated 12/30/2022 FINDINGS: Patient is rotated to the left. Normal lung volumes. No focal consolidations. Unchanged blunting of the left costophrenic angle corresponding to prominent pericardial fat. No pneumothorax. Enlarged cardiomediastinal silhouette is likely projectional. No acute osseous abnormality. Surgical clips project over the left axilla. IMPRESSION: No active disease. Electronically Signed   By: Agustin Cree M.D.   On: 01/11/2023 17:33     Labs:   Basic Metabolic Panel: Recent Labs  Lab 01/11/23 1149 01/12/23 0458 01/12/23 1547 01/13/23 0541  NA 138 140 135 136  K 2.7* 2.9* 3.2* 4.1  CL 98 105 101 107  CO2 25 22 19* 20*  GLUCOSE 111* 98 197* 103*  BUN 29* 25* 24* 21  CREATININE 1.83* 1.69* 1.50* 1.32*  CALCIUM 6.4* 6.3* 7.0* 7.6*  MG 0.5* 0.9* 2.2 2.0  PHOS  --  3.6  --  2.9   GFR Estimated Creatinine Clearance: 27.2 mL/min (A) (by C-G formula based on SCr of 1.32 mg/dL (H)). Liver Function Tests: Recent Labs  Lab 01/12/23 0458 01/13/23 0541  AST 19 20  ALT 10 11   ALKPHOS 44 45  BILITOT 0.4 0.4  PROT 5.8* 6.1*  ALBUMIN 2.6* 2.7*   No results for input(s): "LIPASE", "AMYLASE" in the last 168 hours. No results for input(s): "AMMONIA" in the last 168 hours. Coagulation profile No results for input(s): "INR", "PROTIME" in the last 168 hours.  CBC: Recent Labs  Lab 01/11/23 1149 01/12/23 0458  WBC 9.7 8.0  HGB 8.3* 7.2*  HCT 25.3* 22.7*  MCV 100.0 101.8*  PLT 328 278   Cardiac Enzymes: No results for input(s): "CKTOTAL", "CKMB", "CKMBINDEX", "TROPONINI" in the last 168 hours. BNP: Invalid input(s): "POCBNP" CBG: No results for input(s): "GLUCAP" in the last 168 hours. D-Dimer No results for input(s): "DDIMER" in the last 72 hours. Hgb A1c No results for input(s): "HGBA1C" in the last 72 hours. Lipid Profile No results for input(s): "CHOL", "HDL", "LDLCALC", "TRIG", "CHOLHDL", "LDLDIRECT" in the last 72 hours. Thyroid function studies No results for input(s): "TSH", "T4TOTAL", "T3FREE", "THYROIDAB" in the last 72 hours.  Invalid input(s): "FREET3" Anemia  work up No results for input(s): "VITAMINB12", "FOLATE", "FERRITIN", "TIBC", "IRON", "RETICCTPCT" in the last 72 hours. Microbiology No results found for this or any previous visit (from the past 240 hour(s)).   Discharge Instructions:   Discharge Instructions     Call MD for:  persistant nausea and vomiting   Complete by: As directed    Call MD for:  severe uncontrolled pain   Complete by: As directed    Call MD for:  temperature >100.4   Complete by: As directed    Diet general   Complete by: As directed    Discharge instructions   Complete by: As directed    Follow-up with your primary care provider at the skilled nursing facility in 3 to 5 days.  Check blood work at that time.  Seek medical attention for worsening symptoms.  Do not take pembrolizumab until seen by oncologist.  Follow-up with your oncologist as scheduled by you.  Increase hydration.   Increase activity  slowly   Complete by: As directed       Allergies as of 01/13/2023       Reactions   Other Nausea Only, Other (See Comments)   A lot of antibiotics cause extreme nausea   Tape Other (See Comments)   PAPER TAPE IS MUCH PREFERRED   Aminoglycosides Other (See Comments)   Unknown reaction - pt is not sure where this entry came from   Bacitracin Other (See Comments)   Reaction not recalled   Bee Venom Swelling, Other (See Comments)   Severe body swelling   Cephalexin Nausea Only   Clindamycin/lincomycin Diarrhea, Nausea And Vomiting   Codeine Nausea And Vomiting, Other (See Comments)   Pt can take codeine cough syrup        Medication List     STOP taking these medications    fulvestrant 250 MG/5ML injection Commonly known as: FASLODEX   palbociclib 100 MG tablet Commonly known as: Ibrance       TAKE these medications    acetaminophen 500 MG tablet Commonly known as: TYLENOL Take 1,000 mg by mouth every 8 (eight) hours as needed for mild pain (pain score 1-3) or moderate pain (pain score 4-6).   anastrozole 1 MG tablet Commonly known as: ARIMIDEX TAKE 1 TABLET(1 MG) BY MOUTH DAILY What changed: See the new instructions.   apixaban 2.5 MG Tabs tablet Commonly known as: ELIQUIS Take 1 tablet (2.5 mg total) by mouth 2 (two) times daily.   budesonide 0.25 MG/2ML nebulizer solution Commonly known as: PULMICORT Take 2 mLs (0.25 mg total) by nebulization 2 (two) times daily for 5 days.   CALCIUM CARBONATE-VITAMIN D PO Take 1 tablet by mouth 2 (two) times daily.   cholecalciferol 25 MCG (1000 UNIT) tablet Commonly known as: VITAMIN D3 Take 1,000 Units by mouth daily.   furosemide 40 MG tablet Commonly known as: LASIX Take 1 tablet (40 mg total) by mouth daily as needed for 3 days for fluid or edema (dyspnea, weight gain of 3 lbs in 24 hours), THEN 1 tablet (40 mg total) daily. Start taking on: December 29, 2022   loperamide 2 MG tablet Commonly known as:  IMODIUM A-D Take 2 mg by mouth 4 (four) times daily as needed for diarrhea or loose stools.   loratadine 10 MG tablet Commonly known as: CLARITIN Take 10 mg by mouth every morning.   methocarbamol 500 MG tablet Commonly known as: ROBAXIN Take 500 mg by mouth every 8 (eight) hours as needed  for muscle spasms.   multivitamin with minerals tablet Take 1 tablet by mouth in the morning.   omeprazole 20 MG capsule Commonly known as: PRILOSEC Take 1 capsule by mouth daily What changed:  how much to take how to take this when to take this additional instructions   prochlorperazine 10 MG tablet Commonly known as: COMPAZINE Take 1 tablet (10 mg total) by mouth every 6 (six) hours as needed for nausea or vomiting.   triamcinolone 55 MCG/ACT Aero nasal inhaler Commonly known as: NASACORT Place 2 sprays into the nose daily. 2 sprays each nostril at night before bedtime What changed:  when to take this additional instructions   vitamin B-12 100 MCG tablet Commonly known as: CYANOCOBALAMIN Take 100 mcg by mouth daily.   zolpidem 10 MG tablet Commonly known as: AMBIEN Take 10 mg by mouth at bedtime as needed for sleep.          Time coordinating discharge: 39 minutes  Signed:  Lalanya Rufener  [facility] 01/13/2023, 10:34 AM

## 2023-01-13 NOTE — Plan of Care (Addendum)
Patient is alert and oriented, walking independently in the room with walker at bedside. Patient requesting to go back to her facility and says she feels great. MD Pokhrel notified, discharge paperwork handed to patient with medication list and follow up appointments. Education complete and Patient is awaiting transportation services to go back to [place]. 1200: Discharge completed, report called to [facility], awaiting for [facility] to transport patient. 1335: [facility] arrived to transport patient.  Problem: Education: Goal: Knowledge of General Education information will improve Description: Including pain rating scale, medication(s)/side effects and non-pharmacologic comfort measures Outcome: Adequate for Discharge   Problem: Health Behavior/Discharge Planning: Goal: Ability to manage health-related needs will improve Outcome: Adequate for Discharge   Problem: Clinical Measurements: Goal: Ability to maintain clinical measurements within normal limits will improve Outcome: Adequate for Discharge Goal: Will remain free from infection Outcome: Adequate for Discharge Goal: Diagnostic test results will improve Outcome: Adequate for Discharge Goal: Respiratory complications will improve Outcome: Adequate for Discharge Goal: Cardiovascular complication will be avoided Outcome: Adequate for Discharge   Problem: Activity: Goal: Risk for activity intolerance will decrease Outcome: Adequate for Discharge   Problem: Nutrition: Goal: Adequate nutrition will be maintained Outcome: Adequate for Discharge   Problem: Coping: Goal: Level of anxiety will decrease Outcome: Adequate for Discharge   Problem: Elimination: Goal: Will not experience complications related to bowel motility Outcome: Adequate for Discharge Goal: Will not experience complications related to urinary retention Outcome: Adequate for Discharge   Problem: Pain Management: Goal: General experience of comfort will  improve Outcome: Adequate for Discharge   Problem: Safety: Goal: Ability to remain free from injury will improve Outcome: Adequate for Discharge   Problem: Skin Integrity: Goal: Risk for impaired skin integrity will decrease Outcome: Adequate for Discharge

## 2023-01-13 NOTE — TOC Transition Note (Addendum)
Transition of Care [facility]) - CM/SW Discharge Note   Patient Details  Name: Stacey [name] MRN: [id number] Date of Birth: 17-Nov-1940  [facility] CM/SW Contact:  Beckie Busing, RN Phone Number:[phone]  01/13/2023, 11:14 AM   Clinical Narrative:    Patient with discharge ordered. Patient comes from [facility] for short term rehab. CM called Britney admissions director at [facility] to confirm if patient is able to return. Per Britney patient is able to return and insurance Berkley Harvey is not needed. Discharge summary has been sent.   Please call report to [facility] Room # 603 Report # [phone]  Transportation has been arranged per [facility]. Discharge poacket is at nurses station.          Patient Goals and CMS Choice      Discharge Placement                         Discharge Plan and Services Additional resources added to the After Visit Summary for                                       Social Determinants of Health (SDOH) Interventions SDOH Screenings   Food Insecurity: No Food Insecurity (01/11/2023)  Housing: Patient Declined (01/11/2023)  Transportation Needs: No Transportation Needs (01/11/2023)  Utilities: Not At Risk (01/11/2023)  Alcohol Screen: Low Risk  (11/07/2022)  Depression (PHQ2-9): Low Risk  (11/10/2022)  Financial Resource Strain: Low Risk  (11/07/2022)  Physical Activity: Inactive (11/07/2022)  Social Connections: Socially Isolated (11/07/2022)  Stress: No Stress Concern Present (11/07/2022)  Tobacco Use: Medium Risk (01/11/2023)  Health Literacy: Adequate Health Literacy (11/10/2022)     Readmission Risk Interventions    12/28/2022    3:55 PM 12/27/2022    2:25 PM 11/19/2022    1:54 PM  Readmission Risk Prevention Plan  Transportation Screening  Complete Complete  PCP or Specialist Appt within 5-7 Days   Complete  Home Care Screening   Complete  Medication Review (RN CM)   Complete  Medication Review (RN Care Manager)  Complete    PCP or Specialist appointment within 3-5 days of discharge Complete Complete   HRI or Home Care Consult Complete    SW Recovery Care/Counseling Consult Complete Complete   Palliative Care Screening Not Applicable    Skilled Nursing Facility Complete Complete

## 2023-01-13 NOTE — Progress Notes (Signed)
Mobility Specialist - Progress Note   01/13/23 1057  Oxygen Therapy  SpO2 93 %  O2 Device Nasal Cannula  O2 Flow Rate (L/min) 2 L/min  Patient Activity (if Appropriate) Ambulating  Mobility  Activity Ambulated with assistance in hallway  Level of Assistance Standby assist, set-up cues, supervision of patient - no hands on  Assistive Device Front wheel walker  Distance Ambulated (ft) 290 ft  Activity Response Tolerated well  Mobility Referral Yes  Mobility visit 1 Mobility  Mobility Specialist Start Time (ACUTE ONLY) 1029  Mobility Specialist Stop Time (ACUTE ONLY) 1054  Mobility Specialist Time Calculation (min) (ACUTE ONLY) 25 min   Pt received in bed and agreeable to mobility. Pt required verbal cues to maintain a steady pace. Pt took x1 standing rest break d/t SOB. No complaints during session. Pt to bed after session with all needs met.    Pre-mobility: 95% SpO2 (2L [place]) During mobility: 127 HR, 93% SpO2 (2L [place]) Post-mobility: 122 HR, 94% SPO2 (2L [place])  Chief Technology Officer

## 2023-01-13 NOTE — Consult Note (Signed)
[facility] Liaison Consult Note    01/13/2023  GRACELY KUBINA Sep 25, 1940 [id number]  [facility] Consult:  Community RN [facility] was following at [facility] for disposition needs   This is a remote coverage review for patient admitted to [facility]   Primary Care Provider:  Eloisa Northern, MD who is listed with [facility] which is a noted a change in provider  Insurance:  Medicare ACO REACH  Patient was reviewed for less than 30 days readmission extreme high risk score with 5 hospitalizations in less than 6 months.  Patient was screened for hospitalization and on behalf of [facility]  Care Coordination to assess for post hospital community care needs.  Patient is being recommended by therapy to return for a skilled nursing facility level of care for post hospital transition.  If the patient goes to a [facility] affiliated facility then, patient can be followed by [facility] RN with traditional Medicare and approved Medicare Advantage plans.    Plan: Will follow for progress during hospital stay.  If transitions to affiliated facility, then will notify the Community [facility] RN can follow for any known or needs for transitional care needs for returning to post facility care coordination needs to return to community.  For questions or referrals, please contact:  Charlesetta Shanks, RN, BSN, CCM [place]  [facility], [facility] Liaison Direct Dial: [phone] or secure chat Email: [email or web address]

## 2023-01-14 ENCOUNTER — Telehealth: Payer: Self-pay | Admitting: *Deleted

## 2023-01-14 ENCOUNTER — Ambulatory Visit: Payer: Medicare Other | Admitting: Hematology and Oncology

## 2023-01-14 ENCOUNTER — Inpatient Hospital Stay: Payer: Medicare Other

## 2023-01-14 ENCOUNTER — Ambulatory Visit: Payer: Medicare Other

## 2023-01-14 ENCOUNTER — Other Ambulatory Visit: Payer: Medicare Other

## 2023-01-14 ENCOUNTER — Inpatient Hospital Stay: Payer: Medicare Other | Admitting: Hematology and Oncology

## 2023-01-14 ENCOUNTER — Inpatient Hospital Stay: Payer: Medicare Other | Attending: Adult Health

## 2023-01-14 ENCOUNTER — Other Ambulatory Visit: Payer: Medicare Other | Attending: Adult Health

## 2023-01-14 ENCOUNTER — Ambulatory Visit: Payer: Medicare Other | Admitting: Adult Health

## 2023-01-14 ENCOUNTER — Other Ambulatory Visit (HOSPITAL_COMMUNITY): Payer: Self-pay

## 2023-01-14 NOTE — Progress Notes (Unsigned)
[facility] Cancer Follow up:    Stacey Northern, MD [address] [place] [address]   DIAGNOSIS: Cancer Staging  Malignant neoplasm of upper-inner quadrant of left breast in female, estrogen receptor positive (HCC) Staging form: Breast, AJCC 7th Edition - Clinical: Stage IIB (T2, N1, M0) - Signed by [name], MD on 12/19/2014   SUMMARY OF ONCOLOGIC HISTORY: Oncology History  Malignant neoplasm of upper-inner quadrant of left breast in female, estrogen receptor positive (HCC)  11/1995 Cancer Diagnosis   History of IDC of left breast, grade 2, ER/PR+, 1/15 LN positive for malignancy, s/p doxorubicin and cyclophosphamide x 4 cycles followed by adjuvant RT to left breast and adjuvant tamoxifen x 7 years [name])   11/14/2010 Initial Biopsy   Right breast needle core bx: IMC/MCIS, grade 2 , ER/PR+, HER2/neu negative   12/2010 -  Neo-Adjuvant Anti-estrogen oral therapy   letrozole 2.5 mg daily; changed to tamoxifen 20 mg daily Feb, 2013 due to osteopenia   09/03/2011 Pathologic Stage   Stage IIB: ypT2 ypN1a   09/03/2011 Definitive Surgery   Right lumpectomy/SLNB: ILC with calcifications, grade 2, 1 sentinel LN positive for malignancy   10/19/2011 - 12/03/2011 Radiation Therapy   Adjuvant RT to right breast [name])   06/15/2019 Genetic Testing   Negative genetic testing. No pathogenic variants identified on the [facility] Common Hereditary Cancers Panel. The report date is 06/15/2019.  The Common Hereditary Cancers Panel offered by [facility] includes sequencing and/or deletion duplication testing of the following 48 genes: APC, ATM, AXIN2, BARD1, BMPR1A, BRCA1, BRCA2, BRIP1, CDH1, CDKN2A (p14ARF), CDKN2A (p16INK4a), CKD4, CHEK2, CTNNA1, DICER1, EPCAM (Deletion/duplication testing only), GREM1 (promoter region deletion/duplication testing only), KIT, MEN1, MLH1, MSH2, MSH3, MSH6, MUTYH, NBN, NF1, NHTL1, PALB2, PDGFRA, PMS2, POLD1, POLE, PTEN, RAD50, RAD51C, RAD51D,  RNF43, SDHB, SDHC, SDHD, SMAD4, SMARCA4. STK11, TP53, TSC1, TSC2, and VHL.  The following genes were evaluated for sequence changes only: SDHA and HOXB13 c.251G>A variant only.     CURRENT THERAPY:  INTERVAL HISTORY:  Discussed the use of AI scribe software for clinical note transcription with the patient, who gave verbal consent to proceed.  Stacey [name] 82 y.o. female returns for    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   DVT (deep venous thrombosis) (HCC) 12/04/2022   Diarrhea 12/04/2022   Sepsis (HCC) 11/17/2022   History of DVT (deep vein thrombosis) 11/17/2022   Current chronic use of systemic steroids 11/17/2022   Tachypnea 11/17/2022   Pulmonary nodule 11/17/2022   Acute deep vein thrombosis (DVT) of femoral vein of right lower extremity (HCC) 04/13/2022   Acute upper respiratory infection 05/16/2021   Nasal dryness 02/04/2021   Chronic sinusitis 11/25/2020   Allergic rhinitis 07/08/2020   Hypomagnesemia 06/10/2020   Goals of care, counseling/discussion 06/21/2019   Family history of breast cancer    Family history of pancreatic cancer    Family history of uterine cancer    Family history of  lymphoma    Family history of lung cancer    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HCC) 06/02/2019   Recurrent cancer of left breast (HCC) 05/15/2019   Pes anserine bursitis 03/31/2019   HTN (hypertension) 02/10/2019   Hyperglycemia  06/21/2018   Left carpal tunnel syndrome 02/22/2018   Rotator cuff arthropathy of left shoulder 09/20/2017   Arthritis of hand 08/23/2017   Pain in right hand 08/23/2017   Cervical radiculitis 04/09/2017   Left shoulder pain 04/09/2017   Dyspnea on exertion 05/14/2016   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   Lymphedema of left arm 01/14/2016   Left arm swelling 12/25/2015   SVT (supraventricular tachycardia) (HCC)    Left-sided carotid artery disease (HCC) 03/08/2015   Dizziness 01/24/2015   Urinary frequency 01/13/2015   Dizziness and giddiness 01/09/2015   Gallstones 02/21/2013   Malignant neoplasm of upper-inner quadrant of left breast in female, estrogen receptor positive (HCC) 11/28/2012   Muscle cramping 09/12/2012   Right hip pain 09/12/2012   Lower back pain 09/12/2012   COPD GOLD I     Hx of radiation therapy    Right shoulder pain 09/14/2011   Preventative health care 07/29/2010   Skin lesion of left leg 07/29/2010   Paresthesia 07/29/2010   GERD 03/28/2010   Constipation 03/28/2010   Osteoarthrosis, 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   Anxiety state 05/03/2009   Hyperlipidemia 06/14/2007   FATIGUE 06/14/2007   Anemia 12/17/2006   Diverticulosis of colon 12/17/2006   Cough 12/17/2006   IRRITABLE BOWEL SYNDROME, HX OF 12/17/2006    is allergic to other, tape, aminoglycosides, bacitracin, bee venom, cephalexin, clindamycin/lincomycin, and codeine.  MEDICAL HISTORY: Past Medical History:  Diagnosis Date   ANEMIA-NOS    ANXIETY    Breast cancer (HCC) 1997 L, 2012 R   s/p chemo/xrt   COPD    resolved   DIVERTICULOSIS, COLON 2008   Dizziness    Family history of breast cancer    Family history of lung cancer    Family history of lymphoma    Family history of pancreatic cancer    Family history of uterine cancer    GERD    Hx of radiation therapy 10/19/11 -12/03/11   right breast   HYPERLIPIDEMIA    IRRITABLE BOWEL SYNDROME, HX OF    Left-sided  carotid artery disease (HCC)    moderate left ICA stenosis   OSTEOARTHRITIS, HAND    PSVT (paroxysmal supraventricular tachycardia) (HCC)    symptomatic on event monitor    SURGICAL HISTORY: Past Surgical History:  Procedure Laterality Date   ABDOMINAL HYSTERECTOMY     APPENDECTOMY     BREAST BIOPSY  11/14/10    r breast: inv, insitu mammary carcinoma w/calcif, er/pr +, her2 -   BREAST SURGERY     lumpectomy   CATARACT EXTRACTION     both eyes   ELECTROPHYSIOLOGIC STUDY N/A 05/10/2015   Procedure: SVT Ablation;  Surgeon: Will Jorja Loa, MD;  Location: [facility] INVASIVE [facility];  Service: Cardiovascular;  Laterality: N/A;   HERNIA REPAIR     inguinal herniorrhapy  1984   left   IR US GUIDE BX ASP/DRAIN  05/26/2019   rectal fissure repair     s/p benign breast biopsy  2003   right   s/p left foot surgury  2009   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   spiral fx left foot  2008   no surgury   TMJ  ARTHROPLASTY  1989   TONGUE SURGERY     1988- to remove scar tissue growth    TONSILLECTOMY      SOCIAL HISTORY: Social History   Socioeconomic History   Marital status: Divorced    Spouse name: 2 Step-children   Number of children: 2   Years of education: Not on file   Highest education level: Bachelor's degree (e.g., BA, AB, BS)  Occupational History   Occupation: retired Training and development officer  Tobacco Use   Smoking status: Former    Current packs/day: 0.00    Average packs/day: 1 pack/day for 54.0 years (54.0 ttl pk-yrs)    Types: Cigarettes    Start date: 01/02/1962    Quit date: 01/03/2016    Years since quitting: 7.0   Smokeless tobacco: Never  Vaping Use   Vaping status: Never Used  Substance and Sexual Activity   Alcohol use: Yes    Comment: rare/ drinks socially   Drug use: No   Sexual activity: Never  Other Topics Concern   Not on file  Social History Narrative   Patient gets no regular exercise   No biological children   2 step children   Social Drivers of  Corporate investment banker Strain: Low Risk  (11/07/2022)   Overall Financial Resource Strain (CARDIA)    Difficulty of Paying Living Expenses: Not hard at all  Food Insecurity: No Food Insecurity (01/11/2023)   Hunger Vital Sign    Worried About Running Out of Food in the Last Year: Never true    Ran Out of Food in the Last Year: Never true  Transportation Needs: No Transportation Needs (01/11/2023)   PRAPARE - Administrator, Civil Service (Medical): No    Lack of Transportation (Non-Medical): No  Physical Activity: Inactive (11/07/2022)   Exercise Vital Sign    Days of Exercise per Week: 0 days    Minutes of Exercise per Session: 0 min  Stress: No Stress Concern Present (11/07/2022)   [employer] of Occupational Health - Occupational Stress Questionnaire    Feeling of Stress : Not at all  Social Connections: Socially Isolated (11/07/2022)   Social Connection and Isolation Panel [NHANES]    Frequency of Communication with Friends and Family: More than three times a week    Frequency of Social Gatherings with Friends and Family: Once a week    Attends Religious Services: Never    Database administrator or Organizations: No    Attends Banker Meetings: Never    Marital Status: Divorced  Catering manager Violence: Not At Risk (01/11/2023)   Humiliation, Afraid, Rape, and Kick questionnaire    Fear of Current or Ex-Partner: No    Emotionally Abused: No    Physically Abused: No    Sexually Abused: No    FAMILY HISTORY: Family History  Problem Relation Age of Onset   Hypertension Mother    Stroke Mother    Colon polyps Mother    Diabetes Mother    Pancreatic cancer Mother 60   Uterine cancer Mother 54   Lymphoma Brother        burkitts   Lung cancer Paternal Uncle    Lung cancer Maternal Grandmother 15       non-smoker   Lung cancer Maternal Grandfather    Breast cancer Cousin        maternal cousin, dx in her mid 72s   Brain cancer Cousin         maternal  cousin's son; dx in his 22s   Testicular cancer Cousin        maternal cousin's son;    Breast cancer Cousin        paternal cousin; dx in her 8s   Breast cancer Cousin        paternal cousin's daughter; dx in 44s; neg genetic testing   Colon cancer Neg Hx    Esophageal cancer Neg Hx    Stomach cancer Neg Hx    Rectal cancer Neg Hx     Review of Systems - Oncology    PHYSICAL EXAMINATION    There were no vitals filed for this visit.  Physical Exam  LABORATORY DATA:  CBC    Component Value Date/Time   WBC 8.0 01/12/2023 0458   RBC 2.23 (L) 01/12/2023 0458   HGB 7.2 (L) 01/12/2023 0458   HGB 9.2 (L) 10/23/2022 1358   HGB 12.4 12/09/2015 1408   HCT 22.7 (L) 01/12/2023 0458   HCT 25.8 (L) 07/03/2022 1345   HCT 36.2 12/09/2015 1408   PLT 278 01/12/2023 0458   PLT 175 10/23/2022 1358   PLT 238 12/09/2015 1408   MCV 101.8 (H) 01/12/2023 0458   MCV 89.8 12/09/2015 1408   MCH 32.3 01/12/2023 0458   MCHC 31.7 01/12/2023 0458   RDW 15.5 01/12/2023 0458   RDW 13.2 12/09/2015 1408   LYMPHSABS 0.9 12/30/2022 1015   LYMPHSABS 2.1 12/09/2015 1408   MONOABS 0.9 12/30/2022 1015   MONOABS 0.5 12/09/2015 1408   EOSABS 0.2 12/30/2022 1015   EOSABS 0.2 12/09/2015 1408   BASOSABS 0.1 12/30/2022 1015   BASOSABS 0.0 12/09/2015 1408    CMP     Component Value Date/Time   NA 136 01/13/2023 0541   NA 140 12/09/2015 1409   K 4.1 01/13/2023 0541   K 4.2 12/09/2015 1409   CL 107 01/13/2023 0541   CL 107 02/02/2012 0939   CO2 20 (L) 01/13/2023 0541   CO2 25 12/09/2015 1409   GLUCOSE 103 (H) 01/13/2023 0541   GLUCOSE 102 12/09/2015 1409   GLUCOSE 104 (H) 02/02/2012 0939   BUN 21 01/13/2023 0541   BUN 16.7 12/09/2015 1409   CREATININE 1.32 (H) 01/13/2023 0541   CREATININE 1.31 (H) 10/23/2022 1358   CREATININE 0.8 12/09/2015 1409   CALCIUM 7.6 (L) 01/13/2023 0541   CALCIUM 9.4 12/09/2015 1409   PROT 6.1 (L) 01/13/2023 0541   PROT 7.0 12/09/2015 1409   ALBUMIN  2.7 (L) 01/13/2023 0541   ALBUMIN 3.3 (L) 12/09/2015 1409   AST 20 01/13/2023 0541   AST 18 10/23/2022 1358   AST 20 12/09/2015 1409   ALT 11 01/13/2023 0541   ALT 11 10/23/2022 1358   ALT 13 12/09/2015 1409   ALKPHOS 45 01/13/2023 0541   ALKPHOS 54 12/09/2015 1409   BILITOT 0.4 01/13/2023 0541   BILITOT 0.4 10/23/2022 1358   BILITOT <0.22 12/09/2015 1409   GFRNONAA 40 (L) 01/13/2023 0541   GFRNONAA 41 (L) 10/23/2022 1358   GFRAA >60 10/26/2019 1347   GFRAA >60 05/09/2019 1303       PENDING LABS:   RADIOGRAPHIC STUDIES:  No results found.   PATHOLOGY:     ASSESSMENT and THERAPY PLAN:   No problem-specific Assessment & Plan notes found for this encounter.   No orders of the defined types were placed in this encounter.   All questions were answered. The patient knows to call the clinic with any problems, questions or concerns. We  can certainly see the patient much sooner if necessary. This note was electronically signed. Noreene Filbert, NP 01/14/2023

## 2023-01-14 NOTE — Telephone Encounter (Addendum)
This RN spoke with Stacey [name] per her call stating she had canceled her appts for this week due to readmission to the hospital.  Stacey [name] states " I feel like I am just not getting better ever since Oct when I was in the hospital the first time."  She states she should be d/ced back to [facility] and per posting of this note was d/ced.  Pt is unsure when she can rescheduled appt with MD " but am concerned because I have been off therapy since October"  This note will be forwarded to MD for review and further recommendation.

## 2023-01-15 ENCOUNTER — Other Ambulatory Visit: Payer: Self-pay

## 2023-01-15 ENCOUNTER — Encounter: Payer: Self-pay | Admitting: Oncology

## 2023-01-15 NOTE — Progress Notes (Signed)
Patient therapy is on hold pending office visit. Confirmed with Stacey [name]/Claire to pause enrollment at this time.

## 2023-01-18 DIAGNOSIS — D649 Anemia, unspecified: Secondary | ICD-10-CM | POA: Diagnosis not present

## 2023-01-18 DIAGNOSIS — J9601 Acute respiratory failure with hypoxia: Secondary | ICD-10-CM | POA: Diagnosis not present

## 2023-01-18 DIAGNOSIS — I10 Essential (primary) hypertension: Secondary | ICD-10-CM | POA: Diagnosis not present

## 2023-01-18 DIAGNOSIS — E8721 Acute metabolic acidosis: Secondary | ICD-10-CM | POA: Diagnosis not present

## 2023-01-18 DIAGNOSIS — I5033 Acute on chronic diastolic (congestive) heart failure: Secondary | ICD-10-CM | POA: Diagnosis not present

## 2023-01-18 DIAGNOSIS — C50911 Malignant neoplasm of unspecified site of right female breast: Secondary | ICD-10-CM | POA: Diagnosis not present

## 2023-01-18 DIAGNOSIS — I82409 Acute embolism and thrombosis of unspecified deep veins of unspecified lower extremity: Secondary | ICD-10-CM | POA: Diagnosis not present

## 2023-01-18 DIAGNOSIS — N1832 Chronic kidney disease, stage 3b: Secondary | ICD-10-CM | POA: Diagnosis not present

## 2023-01-18 DIAGNOSIS — M6281 Muscle weakness (generalized): Secondary | ICD-10-CM | POA: Diagnosis not present

## 2023-01-19 ENCOUNTER — Other Ambulatory Visit: Payer: Self-pay | Admitting: *Deleted

## 2023-01-19 ENCOUNTER — Telehealth: Payer: Self-pay | Admitting: *Deleted

## 2023-01-19 NOTE — Telephone Encounter (Signed)
This RN spoke with pt today per follow up and need to schedule an appt. She is reluctant to schedule as she states " I just want to wait until I am discharged - which I have spoken to all here at the facility to let them know I am ready to go home "  She is hoping to be discharged before Christmas and per this call plan is follow up again in 1 week or if pt is discharged prior to that she will call this RN.

## 2023-01-21 ENCOUNTER — Telehealth: Payer: Self-pay

## 2023-01-21 DIAGNOSIS — C50911 Malignant neoplasm of unspecified site of right female breast: Secondary | ICD-10-CM | POA: Diagnosis not present

## 2023-01-21 DIAGNOSIS — I10 Essential (primary) hypertension: Secondary | ICD-10-CM | POA: Diagnosis not present

## 2023-01-21 DIAGNOSIS — M6281 Muscle weakness (generalized): Secondary | ICD-10-CM | POA: Diagnosis not present

## 2023-01-21 DIAGNOSIS — J9601 Acute respiratory failure with hypoxia: Secondary | ICD-10-CM | POA: Diagnosis not present

## 2023-01-21 DIAGNOSIS — I82409 Acute embolism and thrombosis of unspecified deep veins of unspecified lower extremity: Secondary | ICD-10-CM | POA: Diagnosis not present

## 2023-01-21 DIAGNOSIS — D649 Anemia, unspecified: Secondary | ICD-10-CM | POA: Diagnosis not present

## 2023-01-21 DIAGNOSIS — I5033 Acute on chronic diastolic (congestive) heart failure: Secondary | ICD-10-CM | POA: Diagnosis not present

## 2023-01-21 DIAGNOSIS — N1832 Chronic kidney disease, stage 3b: Secondary | ICD-10-CM | POA: Diagnosis not present

## 2023-01-21 DIAGNOSIS — E8721 Acute metabolic acidosis: Secondary | ICD-10-CM | POA: Diagnosis not present

## 2023-01-21 NOTE — Telephone Encounter (Signed)
Ok to forward to new PCP on the record as pt recently changed - thanks

## 2023-01-21 NOTE — Telephone Encounter (Signed)
Copied from CRM [id number]. Topic: Clinical - Lab/Test Results >> Jan 21, 2023  9:03 AM Florestine Avers wrote: Reason for CRM: Jaclyn Prime called and stated that she attempted to call the patient and her daughter 2xs due to her getting a critically low Magnesium lab with a result of 0.8. Selena Batten is a Engineer, civil (consulting) from [employer] and if you need to reach her the best contact is [phone] for any additional information.

## 2023-01-24 DIAGNOSIS — F419 Anxiety disorder, unspecified: Secondary | ICD-10-CM | POA: Diagnosis not present

## 2023-01-24 DIAGNOSIS — Z7901 Long term (current) use of anticoagulants: Secondary | ICD-10-CM | POA: Diagnosis not present

## 2023-01-24 DIAGNOSIS — D631 Anemia in chronic kidney disease: Secondary | ICD-10-CM | POA: Diagnosis not present

## 2023-01-24 DIAGNOSIS — I5030 Unspecified diastolic (congestive) heart failure: Secondary | ICD-10-CM | POA: Diagnosis not present

## 2023-01-24 DIAGNOSIS — N183 Chronic kidney disease, stage 3 unspecified: Secondary | ICD-10-CM | POA: Diagnosis not present

## 2023-01-24 DIAGNOSIS — Z87891 Personal history of nicotine dependence: Secondary | ICD-10-CM | POA: Diagnosis not present

## 2023-01-24 DIAGNOSIS — I130 Hypertensive heart and chronic kidney disease with heart failure and stage 1 through stage 4 chronic kidney disease, or unspecified chronic kidney disease: Secondary | ICD-10-CM | POA: Diagnosis not present

## 2023-01-24 DIAGNOSIS — J449 Chronic obstructive pulmonary disease, unspecified: Secondary | ICD-10-CM | POA: Diagnosis not present

## 2023-01-24 DIAGNOSIS — I4891 Unspecified atrial fibrillation: Secondary | ICD-10-CM | POA: Diagnosis not present

## 2023-01-24 DIAGNOSIS — E785 Hyperlipidemia, unspecified: Secondary | ICD-10-CM | POA: Diagnosis not present

## 2023-01-24 DIAGNOSIS — K219 Gastro-esophageal reflux disease without esophagitis: Secondary | ICD-10-CM | POA: Diagnosis not present

## 2023-01-24 DIAGNOSIS — I6522 Occlusion and stenosis of left carotid artery: Secondary | ICD-10-CM | POA: Diagnosis not present

## 2023-01-24 DIAGNOSIS — K5730 Diverticulosis of large intestine without perforation or abscess without bleeding: Secondary | ICD-10-CM | POA: Diagnosis not present

## 2023-01-24 DIAGNOSIS — Z853 Personal history of malignant neoplasm of breast: Secondary | ICD-10-CM | POA: Diagnosis not present

## 2023-01-24 DIAGNOSIS — J9621 Acute and chronic respiratory failure with hypoxia: Secondary | ICD-10-CM | POA: Diagnosis not present

## 2023-01-24 DIAGNOSIS — Z87440 Personal history of urinary (tract) infections: Secondary | ICD-10-CM | POA: Diagnosis not present

## 2023-01-24 DIAGNOSIS — Z556 Problems related to health literacy: Secondary | ICD-10-CM | POA: Diagnosis not present

## 2023-01-24 DIAGNOSIS — I471 Supraventricular tachycardia, unspecified: Secondary | ICD-10-CM | POA: Diagnosis not present

## 2023-01-29 ENCOUNTER — Encounter: Payer: Self-pay | Admitting: Oncology

## 2023-01-29 DIAGNOSIS — I130 Hypertensive heart and chronic kidney disease with heart failure and stage 1 through stage 4 chronic kidney disease, or unspecified chronic kidney disease: Secondary | ICD-10-CM | POA: Diagnosis not present

## 2023-01-29 DIAGNOSIS — J9621 Acute and chronic respiratory failure with hypoxia: Secondary | ICD-10-CM | POA: Diagnosis not present

## 2023-01-29 DIAGNOSIS — J449 Chronic obstructive pulmonary disease, unspecified: Secondary | ICD-10-CM | POA: Diagnosis not present

## 2023-01-29 DIAGNOSIS — I5030 Unspecified diastolic (congestive) heart failure: Secondary | ICD-10-CM | POA: Diagnosis not present

## 2023-01-29 DIAGNOSIS — D631 Anemia in chronic kidney disease: Secondary | ICD-10-CM | POA: Diagnosis not present

## 2023-01-29 DIAGNOSIS — N183 Chronic kidney disease, stage 3 unspecified: Secondary | ICD-10-CM | POA: Diagnosis not present

## 2023-02-01 ENCOUNTER — Encounter: Payer: Self-pay | Admitting: Hematology and Oncology

## 2023-02-04 ENCOUNTER — Inpatient Hospital Stay: Payer: Medicare Other | Attending: Adult Health

## 2023-02-04 ENCOUNTER — Encounter: Payer: Self-pay | Admitting: Oncology

## 2023-02-04 ENCOUNTER — Other Ambulatory Visit (HOSPITAL_COMMUNITY): Payer: Self-pay

## 2023-02-04 ENCOUNTER — Inpatient Hospital Stay (HOSPITAL_BASED_OUTPATIENT_CLINIC_OR_DEPARTMENT_OTHER): Payer: Medicare Other | Admitting: Hematology and Oncology

## 2023-02-04 ENCOUNTER — Other Ambulatory Visit: Payer: Self-pay

## 2023-02-04 ENCOUNTER — Telehealth: Payer: Self-pay | Admitting: Pharmacy Technician

## 2023-02-04 VITALS — BP 133/52 | HR 98 | Temp 98.0°F | Resp 16 | Wt 142.5 lb

## 2023-02-04 DIAGNOSIS — M7989 Other specified soft tissue disorders: Secondary | ICD-10-CM | POA: Diagnosis not present

## 2023-02-04 DIAGNOSIS — C50212 Malignant neoplasm of upper-inner quadrant of left female breast: Secondary | ICD-10-CM

## 2023-02-04 DIAGNOSIS — R222 Localized swelling, mass and lump, trunk: Secondary | ICD-10-CM | POA: Diagnosis not present

## 2023-02-04 DIAGNOSIS — C50211 Malignant neoplasm of upper-inner quadrant of right female breast: Secondary | ICD-10-CM | POA: Insufficient documentation

## 2023-02-04 DIAGNOSIS — Z923 Personal history of irradiation: Secondary | ICD-10-CM | POA: Insufficient documentation

## 2023-02-04 DIAGNOSIS — J449 Chronic obstructive pulmonary disease, unspecified: Secondary | ICD-10-CM | POA: Diagnosis not present

## 2023-02-04 DIAGNOSIS — Z170 Estrogen receptor positive status [ER+]: Secondary | ICD-10-CM | POA: Diagnosis not present

## 2023-02-04 DIAGNOSIS — Z79818 Long term (current) use of other agents affecting estrogen receptors and estrogen levels: Secondary | ICD-10-CM | POA: Insufficient documentation

## 2023-02-04 DIAGNOSIS — Z79811 Long term (current) use of aromatase inhibitors: Secondary | ICD-10-CM | POA: Diagnosis not present

## 2023-02-04 DIAGNOSIS — E872 Acidosis, unspecified: Secondary | ICD-10-CM | POA: Insufficient documentation

## 2023-02-04 DIAGNOSIS — Z801 Family history of malignant neoplasm of trachea, bronchus and lung: Secondary | ICD-10-CM | POA: Insufficient documentation

## 2023-02-04 DIAGNOSIS — D630 Anemia in neoplastic disease: Secondary | ICD-10-CM

## 2023-02-04 DIAGNOSIS — I951 Orthostatic hypotension: Secondary | ICD-10-CM | POA: Diagnosis not present

## 2023-02-04 DIAGNOSIS — E878 Other disorders of electrolyte and fluid balance, not elsewhere classified: Secondary | ICD-10-CM | POA: Diagnosis not present

## 2023-02-04 DIAGNOSIS — I509 Heart failure, unspecified: Secondary | ICD-10-CM | POA: Insufficient documentation

## 2023-02-04 DIAGNOSIS — Z86718 Personal history of other venous thrombosis and embolism: Secondary | ICD-10-CM | POA: Insufficient documentation

## 2023-02-04 DIAGNOSIS — C7951 Secondary malignant neoplasm of bone: Secondary | ICD-10-CM | POA: Diagnosis not present

## 2023-02-04 DIAGNOSIS — I130 Hypertensive heart and chronic kidney disease with heart failure and stage 1 through stage 4 chronic kidney disease, or unspecified chronic kidney disease: Secondary | ICD-10-CM | POA: Insufficient documentation

## 2023-02-04 DIAGNOSIS — Z803 Family history of malignant neoplasm of breast: Secondary | ICD-10-CM | POA: Insufficient documentation

## 2023-02-04 DIAGNOSIS — Z800 Family history of malignant neoplasm of digestive organs: Secondary | ICD-10-CM | POA: Diagnosis not present

## 2023-02-04 DIAGNOSIS — K219 Gastro-esophageal reflux disease without esophagitis: Secondary | ICD-10-CM | POA: Insufficient documentation

## 2023-02-04 DIAGNOSIS — N183 Chronic kidney disease, stage 3 unspecified: Secondary | ICD-10-CM | POA: Insufficient documentation

## 2023-02-04 DIAGNOSIS — Z87891 Personal history of nicotine dependence: Secondary | ICD-10-CM | POA: Insufficient documentation

## 2023-02-04 DIAGNOSIS — M8580 Other specified disorders of bone density and structure, unspecified site: Secondary | ICD-10-CM | POA: Diagnosis not present

## 2023-02-04 DIAGNOSIS — E785 Hyperlipidemia, unspecified: Secondary | ICD-10-CM | POA: Insufficient documentation

## 2023-02-04 DIAGNOSIS — I890 Lymphedema, not elsewhere classified: Secondary | ICD-10-CM | POA: Insufficient documentation

## 2023-02-04 LAB — CBC WITH DIFFERENTIAL/PLATELET
Abs Immature Granulocytes: 0.03 10*3/uL (ref 0.00–0.07)
Basophils Absolute: 0 10*3/uL (ref 0.0–0.1)
Basophils Relative: 0 %
Eosinophils Absolute: 0.2 10*3/uL (ref 0.0–0.5)
Eosinophils Relative: 2 %
HCT: 28.6 % — ABNORMAL LOW (ref 36.0–46.0)
Hemoglobin: 9.3 g/dL — ABNORMAL LOW (ref 12.0–15.0)
Immature Granulocytes: 0 %
Lymphocytes Relative: 12 %
Lymphs Abs: 0.9 10*3/uL (ref 0.7–4.0)
MCH: 31.6 pg (ref 26.0–34.0)
MCHC: 32.5 g/dL (ref 30.0–36.0)
MCV: 97.3 fL (ref 80.0–100.0)
Monocytes Absolute: 0.5 10*3/uL (ref 0.1–1.0)
Monocytes Relative: 6 %
Neutro Abs: 5.7 10*3/uL (ref 1.7–7.7)
Neutrophils Relative %: 80 %
Platelets: 236 10*3/uL (ref 150–400)
RBC: 2.94 MIL/uL — ABNORMAL LOW (ref 3.87–5.11)
RDW: 15.2 % (ref 11.5–15.5)
WBC: 7.2 10*3/uL (ref 4.0–10.5)
nRBC: 0 % (ref 0.0–0.2)

## 2023-02-04 LAB — CMP (CANCER CENTER ONLY)
ALT: 14 U/L (ref 0–44)
AST: 29 U/L (ref 15–41)
Albumin: 4.1 g/dL (ref 3.5–5.0)
Alkaline Phosphatase: 57 U/L (ref 38–126)
Anion gap: 11 (ref 5–15)
BUN: 18 mg/dL (ref 8–23)
CO2: 21 mmol/L — ABNORMAL LOW (ref 22–32)
Calcium: 9.7 mg/dL (ref 8.9–10.3)
Chloride: 110 mmol/L (ref 98–111)
Creatinine: 1.51 mg/dL — ABNORMAL HIGH (ref 0.44–1.00)
GFR, Estimated: 34 mL/min — ABNORMAL LOW (ref >60)
Glucose, Bld: 138 mg/dL — ABNORMAL HIGH (ref 70–99)
Potassium: 4 mmol/L (ref 3.5–5.1)
Sodium: 142 mmol/L (ref 135–145)
Total Bilirubin: 0.3 mg/dL (ref 0.0–1.2)
Total Protein: 7.7 g/dL (ref 6.5–8.1)

## 2023-02-04 LAB — MAGNESIUM: Magnesium: 1.6 mg/dL — ABNORMAL LOW (ref 1.7–2.4)

## 2023-02-04 MED ORDER — APIXABAN 2.5 MG PO TABS: 2.5000 mg | ORAL_TABLET | Freq: Two times a day (BID) | ORAL | 3 refills | Status: DC

## 2023-02-04 MED ORDER — PALBOCICLIB 75 MG PO TABS
75.0000 mg | ORAL_TABLET | Freq: Every day | ORAL | 3 refills | Status: DC
  Filled 2023-02-04: qty 21, 21d supply, fill #0
  Filled 2023-02-05: qty 21, 28d supply, fill #0
  Filled 2023-02-26: qty 21, 28d supply, fill #1
  Filled 2023-03-31: qty 21, 28d supply, fill #2
  Filled 2023-04-29: qty 21, 28d supply, fill #3

## 2023-02-04 NOTE — Progress Notes (Signed)
 [facility] Cancer Follow up:    Caleen Dirks, MD 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   #2 status post 4 cycles of doxorubicin and cyclophosphamide,                          (i) followed by radiation therapy under the care of Dr. Jadine.   #3 received tamoxifen  for a total of seven years    RIGHT BREAST #4  S/P biopsy of the RIGHT breast upper inner quadrant on 11/14/10 showing invasive ductal carcinoma,, grade 2, estrogen receptor 85% and progesterone receptor 57% positive, Ki67 20%, HER2 not amplified.     #5 started neoadjuvant letrozole  in November 2012; switched to tamoxifen  as of February 2016 due to osteopenia concerns   #6  S/P right lumpectomy with sentinel lymph node biopsy on 09/03/11 for a ypT2, ypN1a, stage IIB invasive lobular carcinoma, grade 2,estrogen receptor 97% positive, progesterone receptor 12% positive, with no HER-2 amplification.   #7 status post right breast radiation therapy under the care of Dr. Patrcia from 10/19/2011 to 12/03/2011.    #8 did not meet criteria for genetic testing according to her insurance company.   #9 osteopenia, with a T score of -1.8 on DEXA scan at [facility] 03/07/2013             (a) status post multiple dental extractions and implants             (b) repeat bone density at [facility] 04/02/2015 shows a T score of -2.0   METASTATIC DISEASE: April 2021 (lymph nodes, bone)   #10:  left upper extremity lymphedema led to chest CT scan 05/09/2019 showing a 5.1 cm left subpectoral chest wall mass and thoracic lymphadenopathy              (a) CT biopsy of the chest wall mass 05/25/2019 confirms recurrent breast cancer, strongly estrogen and progesterone  receptor positive, HER-2 not amplified             (b) PET scan 05/30/2019 shows a left subpectoral mass measuring 5.4 cm, with significant regional and mediastinal adenopathy, sclerotic left scapular metastasis, but no liver or lung involvement.             (c) CA 27-29 is moderately informative   #11 anastrozole  started 06/02/2019; palbociclib  added 06/08/2019             (a) palbociclib  taken irregularly for several months secondary to cytopenias             (b) palbociclib  dose decreased to 100 mg daily 21 on 7 off October 2021             (c) CT of the chest with contrast 11/09/2019 shows mild disease progression (despite a continuing drop in the CA 27-29)             (d) fulvestrant  added 11/23/2019             (e) repeat chest CT with contrast 09/09/2020 read as stable.   #12 denosumab /Xgeva  starting 06/08/2019, to be repeated every 3 months due to hypocalcemia             (  a) treatment held after August 2022 dose secondary to ongoing dental issues   #13 genetics testing 06/15/2019 through the [facility] Common Hereditary Cancers Panel found no deleterious mutations in APC, ATM, AXIN2, BARD1, BMPR1A, BRCA1, BRCA2, BRIP1, CDH1, CDKN2A (p14ARF), CDKN2A (p16INK4a), CKD4, CHEK2, CTNNA1, DICER1, EPCAM (Deletion/duplication testing only), GREM1 (promoter region deletion/duplication testing only), KIT, MEN1, MLH1, MSH2, MSH3, MSH6, MUTYH, NBN, NF1, NHTL1, PALB2, PDGFRA, PMS2, POLD1, POLE, PTEN, RAD50, RAD51C, RAD51D, RNF43, SDHB, SDHC, SDHD, SMAD4, SMARCA4. STK11, TP53, TSC1, TSC2, and VHL.  The following genes were evaluated for sequence changes only: SDHA and HOXB13 c.251G>A variant only.  CURRENT THERAPY: Anastrozole  Fulvestrant  Palbociclib   INTERVAL HISTORY:  Stacey [name] 83 y.o. female returns for follow-up on anastrozole  only  The patient, with a history of cancer, presents for follow-up after multiple hospitalizations recently for pneumonia, congestive heart failure.  She also reports some  pain in the right leg and she is worried if she had another blood clot.  She is taking her Eliquis  2.5 mg p.o. twice daily as recommended.  She also wanted to us  to go through her medication list and adjust this.  She had a few other questions about her hospitalization and wanted us  to review her discharge summaries.  She is hoping to get back on Ibrance  as well as Faslodex .  She is worried that she has only been on anastrozole  for the past couple months.  She is also due for her metastatic breast cancer imaging in March.  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   DVT (deep venous thrombosis) (HCC) 12/04/2022   Diarrhea 12/04/2022   Sepsis (HCC) 11/17/2022   History of DVT (deep vein thrombosis) 11/17/2022   Current chronic use of systemic steroids 11/17/2022   Tachypnea 11/17/2022   Pulmonary nodule 11/17/2022   Acute deep vein thrombosis (DVT) of femoral vein of right lower extremity (HCC) 04/13/2022   Acute upper respiratory infection 05/16/2021   Nasal dryness 02/04/2021   Chronic sinusitis 11/25/2020   Allergic rhinitis 07/08/2020   Hypomagnesemia 06/10/2020   Goals of care, counseling/discussion 06/21/2019   Family history of breast cancer    Family history of pancreatic cancer    Family history of uterine cancer    Family history of lymphoma    Family history of lung cancer    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HCC) 06/02/2019    Recurrent cancer of left breast (HCC) 05/15/2019   Pes anserine bursitis 03/31/2019   HTN (hypertension) 02/10/2019   Hyperglycemia 06/21/2018   Left carpal tunnel syndrome 02/22/2018   Rotator cuff arthropathy of left shoulder 09/20/2017   Arthritis of hand 08/23/2017   Pain in right hand 08/23/2017   Cervical radiculitis 04/09/2017   Left shoulder pain 04/09/2017   Dyspnea on exertion 05/14/2016   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   Lymphedema of left arm 01/14/2016   Left arm swelling 12/25/2015   SVT (supraventricular tachycardia) (HCC)    Left-sided carotid  artery disease (HCC) 03/08/2015   Dizziness 01/24/2015   Urinary frequency 01/13/2015   Dizziness and giddiness 01/09/2015   Gallstones 02/21/2013   Malignant neoplasm of upper-inner quadrant of left breast in female, estrogen receptor positive (HCC) 11/28/2012   Muscle cramping 09/12/2012   Right hip pain 09/12/2012   Lower back pain 09/12/2012   COPD GOLD I     Hx of radiation therapy    Right shoulder pain 09/14/2011   Preventative health care 07/29/2010   Skin lesion of left leg 07/29/2010   Paresthesia 07/29/2010   GERD 03/28/2010   Constipation 03/28/2010   Osteoarthrosis, 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   Anxiety state 05/03/2009   Hyperlipidemia 06/14/2007   FATIGUE 06/14/2007   Anemia 12/17/2006   Diverticulosis of colon 12/17/2006   Cough 12/17/2006   IRRITABLE BOWEL SYNDROME, HX OF 12/17/2006    is allergic to other, tape, aminoglycosides, bacitracin, bee venom, cephalexin , clindamycin/lincomycin, and codeine.  MEDICAL HISTORY: Past Medical History:  Diagnosis Date   ANEMIA-NOS    ANXIETY    Breast cancer (HCC) 1997 L, 2012 R   s/p chemo/xrt   COPD    resolved   DIVERTICULOSIS, COLON 2008   Dizziness    Family history of breast cancer    Family history of lung cancer    Family history of lymphoma    Family history of pancreatic cancer    Family history of uterine cancer     GERD    Hx of radiation therapy 10/19/11 -12/03/11   right breast   HYPERLIPIDEMIA    IRRITABLE BOWEL SYNDROME, HX OF    Left-sided carotid artery disease (HCC)    moderate left ICA stenosis   OSTEOARTHRITIS, HAND    PSVT (paroxysmal supraventricular tachycardia) (HCC)    symptomatic on event monitor    SURGICAL HISTORY: Past Surgical History:  Procedure Laterality Date   ABDOMINAL HYSTERECTOMY     APPENDECTOMY     BREAST BIOPSY  11/14/10    r breast: inv, insitu mammary carcinoma w/calcif, er/pr +, her2 -   BREAST SURGERY     lumpectomy   CATARACT EXTRACTION     both eyes   ELECTROPHYSIOLOGIC STUDY N/A 05/10/2015   Procedure: SVT Ablation;  Surgeon: Will Gladis Norton, MD;  Location: [facility] INVASIVE CV LAB;  Service: Cardiovascular;  Laterality: N/A;   HERNIA REPAIR     inguinal herniorrhapy  1984   left   IR US  GUIDE BX ASP/DRAIN  05/26/2019   rectal fissure repair     s/p benign breast biopsy  2003   right   s/p left foot surgury  2009   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   spiral fx left foot  2008   no surgury   TMJ ARTHROPLASTY  1989   TONGUE SURGERY     1988- to remove scar tissue growth    TONSILLECTOMY      SOCIAL HISTORY: Social History   Socioeconomic History   Marital status: Divorced    Spouse name: 2 Step-children   Number of children: 2   Years of education: Not on file   Highest education level: Bachelor's degree (e.g., BA, AB, BS)  Occupational History   Occupation: retired training and development officer  Tobacco Use   Smoking status: Former    Current packs/day: 0.00    Average packs/day: 1 pack/day for 54.0 years (54.0 ttl pk-yrs)    Types: Cigarettes    Start date: 01/02/1962    Quit date: 01/03/2016  Years since quitting: 7.0   Smokeless tobacco: Never  Vaping Use   Vaping status: Never Used  Substance and Sexual Activity   Alcohol use: Yes    Comment: rare/ drinks socially   Drug use: No   Sexual activity: Never  Other Topics Concern   Not on  file  Social History Narrative   Patient gets no regular exercise   No biological children   2 step children   Social Drivers of Health   Financial Resource Strain: Low Risk  (11/07/2022)   Overall Financial Resource Strain (CARDIA)    Difficulty of Paying Living Expenses: Not hard at all  Food Insecurity: No Food Insecurity (01/11/2023)   Hunger Vital Sign    Worried About Running Out of Food in the Last Year: Never true    Ran Out of Food in the Last Year: Never true  Transportation Needs: No Transportation Needs (01/11/2023)   PRAPARE - Administrator, Civil Service (Medical): No    Lack of Transportation (Non-Medical): No  Physical Activity: Inactive (11/07/2022)   Exercise Vital Sign    Days of Exercise per Week: 0 days    Minutes of Exercise per Session: 0 min  Stress: No Stress Concern Present (11/07/2022)   [employer] of Occupational Health - Occupational Stress Questionnaire    Feeling of Stress : Not at all  Social Connections: Socially Isolated (11/07/2022)   Social Connection and Isolation Panel [NHANES]    Frequency of Communication with Friends and Family: More than three times a week    Frequency of Social Gatherings with Friends and Family: Once a week    Attends Religious Services: Never    Database Administrator or Organizations: No    Attends Banker Meetings: Never    Marital Status: Divorced  Catering Manager Violence: Not At Risk (01/11/2023)   Humiliation, Afraid, Rape, and Kick questionnaire    Fear of Current or Ex-Partner: No    Emotionally Abused: No    Physically Abused: No    Sexually Abused: No    FAMILY HISTORY: Family History  Problem Relation Age of Onset   Hypertension Mother    Stroke Mother    Colon polyps Mother    Diabetes Mother    Pancreatic cancer Mother 18   Uterine cancer Mother 48   Lymphoma Brother        burkitts   Lung cancer Paternal Uncle    Lung cancer Maternal Grandmother 64        non-smoker   Lung cancer Maternal Grandfather    Breast cancer Cousin        maternal cousin, dx in her mid 79s   Brain cancer Cousin        maternal cousin's son; dx in his 18s   Testicular cancer Cousin        maternal cousin's son;    Breast cancer Cousin        paternal cousin; dx in her 65s   Breast cancer Cousin        paternal cousin's daughter; dx in 110s; neg genetic testing   Colon cancer Neg Hx    Esophageal cancer Neg Hx    Stomach cancer Neg Hx    Rectal cancer Neg Hx     Review of Systems  Constitutional:  Positive for fatigue. Negative for appetite change, chills, fever and unexpected weight change.  HENT:   Negative for hearing loss, lump/mass and trouble swallowing.   Eyes:  Negative for eye problems and icterus.  Respiratory:  Positive for shortness of breath (Chronic and at baseline.). Negative for chest tightness and cough.   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 Negative for chest pain and palpitations.  Gastrointestinal:  Negative for abdominal distention, abdominal pain, constipation, diarrhea, nausea and vomiting.  Endocrine: Negative for hot flashes.  Genitourinary:  Negative for difficulty urinating.   Musculoskeletal:  Negative for arthralgias.  Skin:  Negative for itching and rash.  Neurological:  Negative for dizziness, extremity weakness, headaches and numbness.  Hematological:  Negative for adenopathy. Does not bruise/bleed easily.  Psychiatric/Behavioral:  Negative for depression. The patient is not nervous/anxious.       PHYSICAL EXAMINATION     Vitals:   02/04/23 1242  BP: (!) 133/52  Pulse: 98  Resp: 16  Temp: 98 F (36.7 C)  SpO2: 97%    Physical Exam Constitutional:      General: She is not in acute distress.    Appearance: Normal appearance. She is not toxic-appearing.  HENT:     Head: Normocephalic and atraumatic.     Mouth/Throat:     Mouth: Mucous membranes are  moist.     Pharynx: Oropharynx is clear. No oropharyngeal exudate or posterior oropharyngeal erythema.  Eyes:     General: No scleral icterus. Cardiovascular:     Rate and Rhythm: Normal rate and regular rhythm.     Pulses: Normal pulses.     Heart sounds: Normal heart sounds.  Pulmonary:     Effort: Pulmonary effort is normal.     Breath sounds: Normal breath sounds.  Abdominal:     General: Abdomen is flat. Bowel sounds are normal. There is no distension.     Palpations: Abdomen is soft.     Tenderness: There is no abdominal tenderness.  Musculoskeletal:        General: No swelling.     Cervical back: Neck supple.  Lymphadenopathy:     Cervical: No cervical adenopathy.  Skin:    General: Skin is warm and dry.     Findings: No rash.  Neurological:     General: No focal deficit present.     Mental Status: She is alert.  Psychiatric:        Mood and Affect: Mood normal.        Behavior: Behavior normal.     LABORATORY DATA:  CBC    Component Value Date/Time   WBC 7.2 02/04/2023 1210   RBC 2.94 (L) 02/04/2023 1210   HGB 9.3 (L) 02/04/2023 1210   HGB 9.2 (L) 10/23/2022 1358   HGB 12.4 12/09/2015 1408   HCT 28.6 (L) 02/04/2023 1210   HCT 25.8 (L) 07/03/2022 1345   HCT 36.2 12/09/2015 1408   PLT 236 02/04/2023 1210   PLT 175 10/23/2022 1358   PLT 238 12/09/2015 1408   MCV 97.3 02/04/2023 1210   MCV 89.8 12/09/2015 1408   MCH 31.6 02/04/2023 1210   MCHC 32.5 02/04/2023 1210   RDW 15.2 02/04/2023 1210   RDW 13.2 12/09/2015 1408   LYMPHSABS 0.9 02/04/2023 1210   LYMPHSABS 2.1 12/09/2015 1408   MONOABS 0.5 02/04/2023 1210   MONOABS 0.5 12/09/2015 1408   EOSABS 0.2 02/04/2023 1210   EOSABS 0.2 12/09/2015 1408   BASOSABS 0.0 02/04/2023 1210   BASOSABS 0.0 12/09/2015 1408    CMP     Component Value Date/Time   NA 142 02/04/2023 1210  NA 140 12/09/2015 1409   K 4.0 02/04/2023 1210   K 4.2 12/09/2015 1409   CL 110 02/04/2023 1210   CL 107 02/02/2012 0939    CO2 21 (L) 02/04/2023 1210   CO2 25 12/09/2015 1409   GLUCOSE 138 (H) 02/04/2023 1210   GLUCOSE 102 12/09/2015 1409   GLUCOSE 104 (H) 02/02/2012 0939   BUN 18 02/04/2023 1210   BUN 16.7 12/09/2015 1409   CREATININE 1.51 (H) 02/04/2023 1210   CREATININE 0.8 12/09/2015 1409   CALCIUM  9.7 02/04/2023 1210   CALCIUM  9.4 12/09/2015 1409   PROT 7.7 02/04/2023 1210   PROT 7.0 12/09/2015 1409   ALBUMIN 4.1 02/04/2023 1210   ALBUMIN 3.3 (L) 12/09/2015 1409   AST 29 02/04/2023 1210   AST 20 12/09/2015 1409   ALT 14 02/04/2023 1210   ALT 13 12/09/2015 1409   ALKPHOS 57 02/04/2023 1210   ALKPHOS 54 12/09/2015 1409   BILITOT 0.3 02/04/2023 1210   BILITOT <0.22 12/09/2015 1409   GFRNONAA 34 (L) 02/04/2023 1210   GFRAA >60 10/26/2019 1347   GFRAA >60 05/09/2019 1303       ASSESSMENT and THERAPY PLAN:   This is a very pleasant 83 year old female patient with metastatic breast cancer currently on palbociclib , and anastrozole  as well as fulvestrant  since about May 2021 who is here for follow-up.   Breast Cancer Patient on Anastrozole , Ibrance  and Faslodex  held in the past couple months given repeat hospitalizations and concern for infection. Last PET scan showed good response to treatment. -Restart Ibrance  dose to 75mg  to potentially decrease side effects. -Plan to repeat PET scan in March 2025, ordered.  At this time clinically have no concern for progression of her metastatic disease.  Deep Vein Thrombosis (DVT) Patient has history of DVT and is currently on Eliquis  2.5mg  twice daily. Patient reports possible new symptoms suggestive of DVT. -Order lower extremity ultrasound today to evaluate for new DVT. -Continue Eliquis  2.5mg  twice daily.  Hormone Therapy for Breast Cancer Patient on Faslodex  injections, last injection date unknown. -Plan to resume Faslodex  injections starting tomorrow.  Electrolyte Imbalance Recent concerns about electrolyte levels, specifically potassium and  magnesium . Current levels are within normal limits. -No changes to current management.  All questions were answered. The patient knows to call the clinic with any problems, questions or concerns. We can certainly see the patient much sooner if necessary.  Total encounter time:45 minutes*in face-to-face visit time, chart review, lab review, care coordination, order entry, and documentation of the encounter time.  *Total Encounter Time as defined by the Centers for Medicare and Medicaid Services includes, in addition to the face-to-face time of a patient visit (documented in the note above) non-face-to-face time: obtaining and reviewing outside history, ordering and reviewing medications, tests or procedures, care coordination (communications with other health care professionals or caregivers) and documentation in the medical record.

## 2023-02-04 NOTE — Assessment & Plan Note (Signed)
-  continue Anastrozole -Ibrance on hold per oncologist Dr. Al Pimple with her ongoing infection

## 2023-02-04 NOTE — Telephone Encounter (Signed)
 Oral Oncology Patient Advocate Encounter  Was successful in securing patient a grant from [employer] to provide copayment coverage for Ibrance .  This will keep the out of pocket expense at $0.    I have spoken with the patient.   The billing information is as follows and has been shared with [facility].    RxBin: [id number] PCN: [id number] Member ID: [id number] Group ID: [id number] Dates of Eligibility: 02/04/23 through 03/06/23  Fund:  Breast  Stacey [name], CPhT-Adv Oncology Pharmacy Patient Advocate [facility] Direct Number: [phone]  Fax: [phone]

## 2023-02-05 ENCOUNTER — Other Ambulatory Visit: Payer: Self-pay

## 2023-02-05 ENCOUNTER — Encounter: Payer: Self-pay | Admitting: Oncology

## 2023-02-05 ENCOUNTER — Ambulatory Visit (HOSPITAL_COMMUNITY)
Admission: RE | Admit: 2023-02-05 | Discharge: 2023-02-05 | Disposition: A | Discharge status: Home / Self Care | Payer: Medicare Other | Source: Ambulatory Visit | Attending: Hematology and Oncology | Admitting: Hematology and Oncology

## 2023-02-05 ENCOUNTER — Telehealth: Payer: Self-pay | Admitting: *Deleted

## 2023-02-05 ENCOUNTER — Telehealth: Payer: Self-pay | Admitting: Pharmacy Technician

## 2023-02-05 ENCOUNTER — Other Ambulatory Visit (HOSPITAL_COMMUNITY): Payer: Self-pay

## 2023-02-05 ENCOUNTER — Other Ambulatory Visit: Payer: Self-pay | Admitting: *Deleted

## 2023-02-05 ENCOUNTER — Inpatient Hospital Stay: Payer: Medicare Other

## 2023-02-05 VITALS — BP 151/80 | HR 98 | Temp 97.7°F | Resp 18

## 2023-02-05 DIAGNOSIS — Z86718 Personal history of other venous thrombosis and embolism: Secondary | ICD-10-CM | POA: Diagnosis not present

## 2023-02-05 DIAGNOSIS — C50211 Malignant neoplasm of upper-inner quadrant of right female breast: Secondary | ICD-10-CM | POA: Diagnosis not present

## 2023-02-05 DIAGNOSIS — Z79818 Long term (current) use of other agents affecting estrogen receptors and estrogen levels: Secondary | ICD-10-CM | POA: Diagnosis not present

## 2023-02-05 DIAGNOSIS — C7951 Secondary malignant neoplasm of bone: Secondary | ICD-10-CM

## 2023-02-05 DIAGNOSIS — Z79811 Long term (current) use of aromatase inhibitors: Secondary | ICD-10-CM | POA: Diagnosis not present

## 2023-02-05 DIAGNOSIS — C50212 Malignant neoplasm of upper-inner quadrant of left female breast: Secondary | ICD-10-CM

## 2023-02-05 DIAGNOSIS — Z170 Estrogen receptor positive status [ER+]: Secondary | ICD-10-CM | POA: Diagnosis not present

## 2023-02-05 DIAGNOSIS — M7989 Other specified soft tissue disorders: Secondary | ICD-10-CM | POA: Diagnosis not present

## 2023-02-05 MED ORDER — FULVESTRANT 250 MG/5ML IM SOSY
500.0000 mg | PREFILLED_SYRINGE | Freq: Once | INTRAMUSCULAR | Status: AC
  Administered 2023-02-05: 500 mg via INTRAMUSCULAR
  Filled 2023-02-05: qty 10

## 2023-02-05 MED ORDER — APIXABAN 2.5 MG PO TABS: 2.5000 mg | ORAL_TABLET | Freq: Two times a day (BID) | ORAL | 1 refills | Status: DC

## 2023-02-05 NOTE — Progress Notes (Signed)
 RLE venous duplex has been completed.  Preliminary results given to Dr. Al Pimple.    Results can be found under chart review under CV PROC. 02/05/2023 2:47 PM Milbern Doescher RVT, RDMS

## 2023-02-05 NOTE — Progress Notes (Signed)
 Specialty Pharmacy Ongoing Clinical Assessment Note  Stacey [name] is a 83 y.o. female who is being followed by the specialty pharmacy service for RxSp Oncology   Patient's specialty medication(s) reviewed today: Palbociclib  (IBRANCE )   Missed doses in the last 4 weeks: 0   Patient/Caregiver did not have any additional questions or concerns.   Therapeutic benefit summary: Unable to assess   Adverse events/side effects summary: Unable to assess   Patient's therapy is appropriate to: Initiate    Goals Addressed             This Visit's Progress    Stabilization of disease       Patient is initiating therapy. Patient will maintain adherence.  Patient is restarting therapy at a reduced dose after being on hold since October.          Follow up:  6 months  [name] [facility] Specialty Pharmacist

## 2023-02-05 NOTE — Progress Notes (Signed)
 Patient was seen by Dr. Loretha on 1/2 and Ibrance  75mg  was sent over to Specialty Pharmacy. Previously Ibrance  has been on hold due to repeat hospitalizations and concern for infection. Office visit notes to restart Ibrance  75mg . Called patient and LVM to set up Ibrance  refill.

## 2023-02-05 NOTE — Progress Notes (Signed)
 Specialty Pharmacy Refill Coordination Note  Stacey [name] is a 83 y.o. female contacted today regarding refills of specialty medication(s) Palbociclib  (IBRANCE )   Patient requested Delivery   Delivery date: 02/08/23   Verified address: [address]  [place] [address]   Medication will be filled on 02/05/23.   Dose has been decreased from Ibrance  100mg  to 75mg . Patient is aware and is ok with receiving next supply on 02/08/23.

## 2023-02-05 NOTE — Telephone Encounter (Signed)
 LM for patient that Dr Loretha wants her to increase her Eliquis  to 2 tablets twice per day. Is currently taking 1 tablet twice per day.   Message sent to [facility] injection nurses to make sure patient got the message and understands. Pt has 2:30 injection appt.

## 2023-02-05 NOTE — Telephone Encounter (Signed)
 Oral Oncology Patient Advocate Encounter  Was successful in securing patient a $15,000 grant from [facility] to provide copayment coverage for Ibrance .  This will keep the out of pocket expense at $0.     [employer] ID: [id number]  I have spoken with the patient.   The billing information is as follows and has been shared with [facility].    RxBin: [id number] PCN: [id number] Member ID: [id number] Group ID: [id number] Dates of Eligibility: 01/14/23 through 01/13/24  Fund:  Breast   Estefana Moellers, CPhT-Adv Oncology Pharmacy Patient Advocate [facility] Direct Number: [phone]  Fax: [phone]

## 2023-02-08 DIAGNOSIS — N183 Chronic kidney disease, stage 3 unspecified: Secondary | ICD-10-CM | POA: Diagnosis not present

## 2023-02-08 DIAGNOSIS — I130 Hypertensive heart and chronic kidney disease with heart failure and stage 1 through stage 4 chronic kidney disease, or unspecified chronic kidney disease: Secondary | ICD-10-CM | POA: Diagnosis not present

## 2023-02-08 DIAGNOSIS — D631 Anemia in chronic kidney disease: Secondary | ICD-10-CM | POA: Diagnosis not present

## 2023-02-08 DIAGNOSIS — J9621 Acute and chronic respiratory failure with hypoxia: Secondary | ICD-10-CM | POA: Diagnosis not present

## 2023-02-08 DIAGNOSIS — I5030 Unspecified diastolic (congestive) heart failure: Secondary | ICD-10-CM | POA: Diagnosis not present

## 2023-02-08 DIAGNOSIS — J449 Chronic obstructive pulmonary disease, unspecified: Secondary | ICD-10-CM | POA: Diagnosis not present

## 2023-02-09 ENCOUNTER — Encounter: Payer: Self-pay | Admitting: Internal Medicine

## 2023-02-09 ENCOUNTER — Ambulatory Visit (INDEPENDENT_AMBULATORY_CARE_PROVIDER_SITE_OTHER): Payer: Medicare Other | Admitting: Internal Medicine

## 2023-02-09 VITALS — BP 126/82 | HR 96 | Temp 98.3°F | Ht 63.0 in | Wt 141.0 lb

## 2023-02-09 DIAGNOSIS — I5030 Unspecified diastolic (congestive) heart failure: Secondary | ICD-10-CM | POA: Insufficient documentation

## 2023-02-09 DIAGNOSIS — K219 Gastro-esophageal reflux disease without esophagitis: Secondary | ICD-10-CM

## 2023-02-09 DIAGNOSIS — N1831 Chronic kidney disease, stage 3a: Secondary | ICD-10-CM

## 2023-02-09 DIAGNOSIS — I10 Essential (primary) hypertension: Secondary | ICD-10-CM

## 2023-02-09 MED ORDER — OMEPRAZOLE 40 MG PO CPDR: 40.0000 mg | DELAYED_RELEASE_CAPSULE | Freq: Every day | ORAL | 3 refills | Status: DC

## 2023-02-09 MED ORDER — FULVESTRANT 250 MG/5ML IM SOSY: 250.0000 mg | PREFILLED_SYRINGE | Freq: Once | INTRAMUSCULAR | Status: AC

## 2023-02-09 NOTE — Assessment & Plan Note (Signed)
 Uncontrolled, ok for increased prilosec 40 mg qd

## 2023-02-09 NOTE — Progress Notes (Signed)
 Patient ID: Stacey [name], female   DOB: [birth year], 83 y.o.   MRN: [id number]         Chief Complaint:: yearly exam - hosp f/u after hospn dec 9 - 11 2024 with AKI low bp abnormal lytes improved with IVF after diarrhea with breast ca medication       HPI:  Stacey [name] is a 83 y.o. female here after multiple ED  and admissions after Nov 12 2022 with uti, sepsis, elect abnormals, dehydration  Pt denies chest pain, increased sob or doe, wheezing, orthopnea, PND, increased LE swelling, palpitations, dizziness or syncope.   Pt denies polydipsia, polyuria, or new focal neuro s/s.    Pt denies fever, wt loss, night sweats, loss of appetite, or other constitutional symptoms  Does mild worsening GERD despite 20 mg prilosec.  Due to start further PT soon after recent rehab stay. Asks for cards referral as recommended at one point after one of her hospns.  Also seen per renal while hospd but no need for f/u it seems.     Wt Readings from Last 3 Encounters:  02/09/23 141 lb (64 kg)  02/04/23 142 lb 8 oz (64.6 kg)  01/11/23 135 lb (61.2 kg)   BP Readings from Last 3 Encounters:  02/09/23 126/82  02/05/23 (!) 151/80  02/04/23 (!) 133/52   Immunization History  Administered Date(s) Administered   Fluad Quad(high Dose 65+) 11/03/2018   Influenza Split 12/17/2011   Influenza Whole 12/17/2006, 10/23/2008, 10/18/2010   Influenza, High Dose Seasonal PF 11/16/2013, 11/26/2017   Influenza,inj,Quad PF,6+ Mos 11/19/2020   Influenza-Unspecified 11/02/2012, 11/17/2014, 11/05/2015, 11/03/2019   PFIZER(Purple Top)SARS-COV-2 Vaccination 02/27/2019, 03/27/2019, 09/20/2019, 08/15/2020, 11/19/2020   Pneumococcal Conjugate-13 12/15/2013   Pneumococcal Polysaccharide-23 01/20/2006   Td 06/14/2008   Tdap 02/10/2019   Health Maintenance Due  Topic Date Due   Zoster Vaccines- Shingrix (1 of 2) Never done   INFLUENZA VACCINE  09/03/2022   COVID-19 Vaccine (6 - 2024-25 season) 10/04/2022      Past Medical History:   Diagnosis Date   ANEMIA-NOS    ANXIETY    Breast cancer (HCC) 1997 L, 2012 R   s/p chemo/xrt   COPD    resolved   DIVERTICULOSIS, COLON 2008   Dizziness    Family history of breast cancer    Family history of lung cancer    Family history of lymphoma    Family history of pancreatic cancer    Family history of uterine cancer    GERD    Hx of radiation therapy 10/19/11 -12/03/11   right breast   HYPERLIPIDEMIA    IRRITABLE BOWEL SYNDROME, HX OF    Left-sided carotid artery disease (HCC)    moderate left ICA stenosis   OSTEOARTHRITIS, HAND    PSVT (paroxysmal supraventricular tachycardia) (HCC)    symptomatic on event monitor   Past Surgical History:  Procedure Laterality Date   ABDOMINAL HYSTERECTOMY     APPENDECTOMY     BREAST BIOPSY  11/14/10    r breast: inv, insitu mammary carcinoma w/calcif, er/pr +, her2 -   BREAST SURGERY     lumpectomy   CATARACT EXTRACTION     both eyes   ELECTROPHYSIOLOGIC STUDY N/A 05/10/2015   Procedure: SVT Ablation;  Surgeon: Will Gladis Norton, MD;  Location: [facility];  Service: Cardiovascular;  Laterality: N/A;   HERNIA REPAIR     inguinal herniorrhapy  1984   left   IR US  GUIDE BX ASP/DRAIN  05/26/2019   rectal fissure repair     s/p benign breast biopsy  2003   right   s/p left foot surgury  2009   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   spiral fx left foot  2008   no surgury   TMJ ARTHROPLASTY  1989   TONGUE SURGERY     1988- to remove scar tissue growth    TONSILLECTOMY      reports that she quit smoking about 7 years ago. Her smoking use included cigarettes. She started smoking about 61 years ago. She has a 54 pack-year smoking history. She has never used smokeless tobacco. She reports current alcohol use. She reports that she does not use drugs. family history includes Brain cancer in her cousin; Breast cancer in her cousin, cousin, and cousin; Colon polyps in her mother; Diabetes in her mother; Hypertension in  her mother; Lung cancer in her maternal grandfather and paternal uncle; Lung cancer (age of onset: 57) in her maternal grandmother; Lymphoma in her brother; Pancreatic cancer (age of onset: 52) in her mother; Stroke in her mother; Testicular cancer in her cousin; Uterine cancer (age of onset: 70) in her mother. Allergies  Allergen Reactions   Other Nausea Only and Other (See Comments)    A lot of antibiotics cause extreme nausea   Tape Other (See Comments)    PAPER TAPE IS MUCH PREFERRED   Aminoglycosides Other (See Comments)    Unknown reaction - pt is not sure where this entry came from   Bacitracin Other (See Comments)    Reaction not recalled   Bee Venom Swelling and Other (See Comments)    Severe body swelling   Cephalexin  Nausea Only   Clindamycin/Lincomycin Diarrhea and Nausea And Vomiting   Codeine Nausea And Vomiting and Other (See Comments)    Pt can take codeine cough syrup   Current Outpatient Medications on File Prior to Visit  Medication Sig Dispense Refill   acetaminophen  (TYLENOL ) 500 MG tablet Take 1,000 mg by mouth every 8 (eight) hours as needed for mild pain (pain score 1-3) or moderate pain (pain score 4-6).     anastrozole  (ARIMIDEX ) 1 MG tablet TAKE 1 TABLET(1 MG) BY MOUTH DAILY (Patient taking differently: Take 1 mg by mouth daily.) 90 tablet 0   apixaban  (ELIQUIS ) 2.5 MG TABS tablet Take 1 tablet (2.5 mg total) by mouth 2 (two) times daily. 60 tablet 1   CALCIUM  CARBONATE-VITAMIN D  PO Take 1 tablet by mouth 2 (two) times daily.     cholecalciferol (VITAMIN D3) 25 MCG (1000 UNIT) tablet Take 1,000 Units by mouth daily.     loperamide  (IMODIUM  A-D) 2 MG tablet Take 2 mg by mouth 4 (four) times daily as needed for diarrhea or loose stools.     loratadine  (CLARITIN ) 10 MG tablet Take 10 mg by mouth every morning.     methocarbamol  (ROBAXIN ) 500 MG tablet Take 500 mg by mouth every 8 (eight) hours as needed for muscle spasms.     Multiple Vitamins-Minerals  (MULTIVITAMIN WITH MINERALS) tablet Take 1 tablet by mouth in the morning.     palbociclib  (IBRANCE ) 75 MG tablet Take 1 tablet (75 mg total) by mouth daily. Take for 21 days on, 7 days off, repeat every 28 days. 21 tablet 3   triamcinolone  (NASACORT ) 55 MCG/ACT AERO nasal inhaler Place 2 sprays into the nose daily. 2 sprays each nostril at night before bedtime (Patient taking differently: Place 2 sprays  into the nose See admin instructions. Instill 2 sprays into each nostril every night before bedtime) 1 each 12   vitamin B-12 (CYANOCOBALAMIN ) 100 MCG tablet Take 100 mcg by mouth daily.     zolpidem  (AMBIEN ) 10 MG tablet Take 10 mg by mouth at bedtime as needed for sleep.     furosemide  (LASIX ) 40 MG tablet Take 1 tablet (40 mg total) by mouth daily as needed for 3 days for fluid or edema (dyspnea, weight gain of 3 lbs in 24 hours), THEN 1 tablet (40 mg total) daily.     No current facility-administered medications on file prior to visit.        ROS:  All others reviewed and negative.  Objective        PE:  BP 126/82 (BP Location: Left Arm, Patient Position: Sitting, Cuff Size: Normal)   Pulse 96   Temp 98.3 F (36.8 C) (Oral)   Ht 5' 3 (1.6 m)   Wt 141 lb (64 kg)   SpO2 97%   BMI 24.98 kg/m                 Constitutional: Pt appears in NAD               HENT: Head: NCAT.                Right Ear: External ear normal.                 Left Ear: External ear normal.                Eyes: . Pupils are equal, round, and reactive to light. Conjunctivae and EOM are normal               Nose: without d/c or deformity               Neck: Neck supple. Gross normal ROM               Cardiovascular: Normal rate and regular rhythm.                 Pulmonary/Chest: Effort normal and breath sounds without rales or wheezing.                Abd:  Soft, NT, ND, + BS, no organomegaly               Neurological: Pt is alert. At baseline orientation, motor grossly intact               Skin: Skin is  warm. No rashes, no other new lesions, LE edema - trace to 1+ right > left edema                Psychiatric: Pt behavior is normal without agitation   Micro: none  Cardiac tracings I have personally interpreted today:  none  Pertinent Radiological findings (summarize): none   Lab Results  Component Value Date   WBC 7.2 02/04/2023   HGB 9.3 (L) 02/04/2023   HCT 28.6 (L) 02/04/2023   PLT 236 02/04/2023   GLUCOSE 138 (H) 02/04/2023   CHOL 193 07/13/2018   TRIG 231.0 (H) 07/13/2018   HDL 49.40 07/13/2018   LDLDIRECT 119.0 07/13/2018   LDLCALC 84 07/04/2009   ALT 14 02/04/2023   AST 29 02/04/2023   NA 142 02/04/2023   K 4.0 02/04/2023   CL 110 02/04/2023   CREATININE 1.51 (H) 02/04/2023   BUN 18 02/04/2023  CO2 21 (L) 02/04/2023   TSH 0.784 12/05/2022   INR 3.2 (H) 12/05/2022   HGBA1C 6.6 (H) 07/13/2018   Assessment/Plan:  Stacey [name] is a 83 y.o. White or Caucasian [1] female with  has a past medical history of ANEMIA-NOS, ANXIETY, Breast cancer (HCC) (1997 L, 2012 R), COPD, DIVERTICULOSIS, COLON (2008), Dizziness, Family history of breast cancer, Family history of lung cancer, Family history of lymphoma, Family history of pancreatic cancer, Family history of uterine cancer, GERD, radiation therapy (10/19/11 -12/03/11), HYPERLIPIDEMIA, IRRITABLE BOWEL SYNDROME, HX OF, Left-sided carotid artery disease (HCC), OSTEOARTHRITIS, HAND, and PSVT (paroxysmal supraventricular tachycardia) (HCC).  Diastolic CHF (HCC) Stable overall, continue lasix  prn, for cardiology referral  CKD (chronic kidney disease), stage III (HCC) Lab Results  Component Value Date   CREATININE 1.51 (H) 02/04/2023   Stable overall, cont to avoid nephrotoxins   HTN (hypertension) BP Readings from Last 3 Encounters:  02/09/23 126/82  02/05/23 (!) 151/80  02/04/23 (!) 133/52   Stable, pt to continue medical treatment  - diet, wt control   GERD Uncontrolled, ok for increased prilosec 40 mg  qd  Followup: Return in about 6 months (around 08/09/2023).  Lynwood Rush, MD 02/09/2023 8:13 PM [facility]

## 2023-02-09 NOTE — Assessment & Plan Note (Signed)
 BP Readings from Last 3 Encounters:  02/09/23 126/82  02/05/23 (!) 151/80  02/04/23 (!) 133/52   Stable, pt to continue medical treatment  - diet, wt control

## 2023-02-09 NOTE — Patient Instructions (Signed)
 Ok to increase the prilosec to 40 mg per day  Please continue all other medications as before, and refills have been done if requested.  Please have the pharmacy call with any other refills you may need.  Please continue your efforts at being more active, low cholesterol diet, and weight control  Please keep your appointments with your specialists as you may have planned - Physical Therapy and Oncology  You will be contacted regarding the referral for: Cardiology

## 2023-02-09 NOTE — Assessment & Plan Note (Signed)
 Lab Results  Component Value Date   CREATININE 1.51 (H) 02/04/2023   Stable overall, cont to avoid nephrotoxins

## 2023-02-09 NOTE — Assessment & Plan Note (Signed)
 Stable overall, continue lasix prn, for cardiology referral

## 2023-02-11 ENCOUNTER — Telehealth: Payer: Self-pay | Admitting: Internal Medicine

## 2023-02-11 DIAGNOSIS — I130 Hypertensive heart and chronic kidney disease with heart failure and stage 1 through stage 4 chronic kidney disease, or unspecified chronic kidney disease: Secondary | ICD-10-CM | POA: Diagnosis not present

## 2023-02-11 DIAGNOSIS — N183 Chronic kidney disease, stage 3 unspecified: Secondary | ICD-10-CM | POA: Diagnosis not present

## 2023-02-11 DIAGNOSIS — D631 Anemia in chronic kidney disease: Secondary | ICD-10-CM | POA: Diagnosis not present

## 2023-02-11 DIAGNOSIS — J449 Chronic obstructive pulmonary disease, unspecified: Secondary | ICD-10-CM | POA: Diagnosis not present

## 2023-02-11 DIAGNOSIS — J9621 Acute and chronic respiratory failure with hypoxia: Secondary | ICD-10-CM | POA: Diagnosis not present

## 2023-02-11 DIAGNOSIS — I5030 Unspecified diastolic (congestive) heart failure: Secondary | ICD-10-CM | POA: Diagnosis not present

## 2023-02-11 NOTE — Telephone Encounter (Signed)
 Ok for [employer]

## 2023-02-11 NOTE — Telephone Encounter (Signed)
 Copied from CRM [id number]. Topic: Clinical - Home Health Verbal Orders >> Feb 11, 2023  3:41 PM Drema MATSU wrote: Caller/Agency: [facility] Callback Number: [phone] Service Requested: Physical Therapy Frequency: 1 Week 1 and will be decided after that  Any new concerns about the patient? No Patient is feeling fatigued and some has some activity intolerance

## 2023-02-12 NOTE — Telephone Encounter (Signed)
 Called and gave verbals.

## 2023-02-15 DIAGNOSIS — I5030 Unspecified diastolic (congestive) heart failure: Secondary | ICD-10-CM | POA: Diagnosis not present

## 2023-02-15 DIAGNOSIS — I130 Hypertensive heart and chronic kidney disease with heart failure and stage 1 through stage 4 chronic kidney disease, or unspecified chronic kidney disease: Secondary | ICD-10-CM | POA: Diagnosis not present

## 2023-02-15 DIAGNOSIS — J449 Chronic obstructive pulmonary disease, unspecified: Secondary | ICD-10-CM | POA: Diagnosis not present

## 2023-02-15 DIAGNOSIS — J9621 Acute and chronic respiratory failure with hypoxia: Secondary | ICD-10-CM | POA: Diagnosis not present

## 2023-02-15 DIAGNOSIS — N183 Chronic kidney disease, stage 3 unspecified: Secondary | ICD-10-CM | POA: Diagnosis not present

## 2023-02-15 DIAGNOSIS — D631 Anemia in chronic kidney disease: Secondary | ICD-10-CM | POA: Diagnosis not present

## 2023-02-18 DIAGNOSIS — I130 Hypertensive heart and chronic kidney disease with heart failure and stage 1 through stage 4 chronic kidney disease, or unspecified chronic kidney disease: Secondary | ICD-10-CM | POA: Diagnosis not present

## 2023-02-18 DIAGNOSIS — I5030 Unspecified diastolic (congestive) heart failure: Secondary | ICD-10-CM | POA: Diagnosis not present

## 2023-02-18 DIAGNOSIS — J449 Chronic obstructive pulmonary disease, unspecified: Secondary | ICD-10-CM | POA: Diagnosis not present

## 2023-02-18 DIAGNOSIS — D631 Anemia in chronic kidney disease: Secondary | ICD-10-CM | POA: Diagnosis not present

## 2023-02-18 DIAGNOSIS — N183 Chronic kidney disease, stage 3 unspecified: Secondary | ICD-10-CM | POA: Diagnosis not present

## 2023-02-18 DIAGNOSIS — J9621 Acute and chronic respiratory failure with hypoxia: Secondary | ICD-10-CM | POA: Diagnosis not present

## 2023-02-19 DIAGNOSIS — I130 Hypertensive heart and chronic kidney disease with heart failure and stage 1 through stage 4 chronic kidney disease, or unspecified chronic kidney disease: Secondary | ICD-10-CM | POA: Diagnosis not present

## 2023-02-19 DIAGNOSIS — D631 Anemia in chronic kidney disease: Secondary | ICD-10-CM | POA: Diagnosis not present

## 2023-02-19 DIAGNOSIS — N183 Chronic kidney disease, stage 3 unspecified: Secondary | ICD-10-CM | POA: Diagnosis not present

## 2023-02-19 DIAGNOSIS — J449 Chronic obstructive pulmonary disease, unspecified: Secondary | ICD-10-CM | POA: Diagnosis not present

## 2023-02-19 DIAGNOSIS — J9621 Acute and chronic respiratory failure with hypoxia: Secondary | ICD-10-CM | POA: Diagnosis not present

## 2023-02-19 DIAGNOSIS — I5030 Unspecified diastolic (congestive) heart failure: Secondary | ICD-10-CM | POA: Diagnosis not present

## 2023-02-21 DIAGNOSIS — I5030 Unspecified diastolic (congestive) heart failure: Secondary | ICD-10-CM | POA: Diagnosis not present

## 2023-02-21 DIAGNOSIS — J9621 Acute and chronic respiratory failure with hypoxia: Secondary | ICD-10-CM | POA: Diagnosis not present

## 2023-02-21 DIAGNOSIS — J449 Chronic obstructive pulmonary disease, unspecified: Secondary | ICD-10-CM | POA: Diagnosis not present

## 2023-02-21 DIAGNOSIS — N183 Chronic kidney disease, stage 3 unspecified: Secondary | ICD-10-CM | POA: Diagnosis not present

## 2023-02-21 DIAGNOSIS — I130 Hypertensive heart and chronic kidney disease with heart failure and stage 1 through stage 4 chronic kidney disease, or unspecified chronic kidney disease: Secondary | ICD-10-CM | POA: Diagnosis not present

## 2023-02-21 DIAGNOSIS — D631 Anemia in chronic kidney disease: Secondary | ICD-10-CM | POA: Diagnosis not present

## 2023-02-22 DIAGNOSIS — I130 Hypertensive heart and chronic kidney disease with heart failure and stage 1 through stage 4 chronic kidney disease, or unspecified chronic kidney disease: Secondary | ICD-10-CM | POA: Diagnosis not present

## 2023-02-22 DIAGNOSIS — N183 Chronic kidney disease, stage 3 unspecified: Secondary | ICD-10-CM | POA: Diagnosis not present

## 2023-02-22 DIAGNOSIS — J449 Chronic obstructive pulmonary disease, unspecified: Secondary | ICD-10-CM | POA: Diagnosis not present

## 2023-02-22 DIAGNOSIS — I5030 Unspecified diastolic (congestive) heart failure: Secondary | ICD-10-CM | POA: Diagnosis not present

## 2023-02-22 DIAGNOSIS — J9621 Acute and chronic respiratory failure with hypoxia: Secondary | ICD-10-CM | POA: Diagnosis not present

## 2023-02-22 DIAGNOSIS — D631 Anemia in chronic kidney disease: Secondary | ICD-10-CM | POA: Diagnosis not present

## 2023-02-23 DIAGNOSIS — I6522 Occlusion and stenosis of left carotid artery: Secondary | ICD-10-CM | POA: Diagnosis not present

## 2023-02-23 DIAGNOSIS — I471 Supraventricular tachycardia, unspecified: Secondary | ICD-10-CM | POA: Diagnosis not present

## 2023-02-23 DIAGNOSIS — F419 Anxiety disorder, unspecified: Secondary | ICD-10-CM | POA: Diagnosis not present

## 2023-02-23 DIAGNOSIS — I5030 Unspecified diastolic (congestive) heart failure: Secondary | ICD-10-CM | POA: Diagnosis not present

## 2023-02-23 DIAGNOSIS — E785 Hyperlipidemia, unspecified: Secondary | ICD-10-CM | POA: Diagnosis not present

## 2023-02-23 DIAGNOSIS — Z556 Problems related to health literacy: Secondary | ICD-10-CM | POA: Diagnosis not present

## 2023-02-23 DIAGNOSIS — Z87440 Personal history of urinary (tract) infections: Secondary | ICD-10-CM | POA: Diagnosis not present

## 2023-02-23 DIAGNOSIS — K219 Gastro-esophageal reflux disease without esophagitis: Secondary | ICD-10-CM | POA: Diagnosis not present

## 2023-02-23 DIAGNOSIS — K5730 Diverticulosis of large intestine without perforation or abscess without bleeding: Secondary | ICD-10-CM | POA: Diagnosis not present

## 2023-02-23 DIAGNOSIS — I4891 Unspecified atrial fibrillation: Secondary | ICD-10-CM | POA: Diagnosis not present

## 2023-02-23 DIAGNOSIS — D631 Anemia in chronic kidney disease: Secondary | ICD-10-CM | POA: Diagnosis not present

## 2023-02-23 DIAGNOSIS — Z7901 Long term (current) use of anticoagulants: Secondary | ICD-10-CM | POA: Diagnosis not present

## 2023-02-23 DIAGNOSIS — N183 Chronic kidney disease, stage 3 unspecified: Secondary | ICD-10-CM | POA: Diagnosis not present

## 2023-02-23 DIAGNOSIS — J9621 Acute and chronic respiratory failure with hypoxia: Secondary | ICD-10-CM | POA: Diagnosis not present

## 2023-02-23 DIAGNOSIS — Z87891 Personal history of nicotine dependence: Secondary | ICD-10-CM | POA: Diagnosis not present

## 2023-02-23 DIAGNOSIS — I130 Hypertensive heart and chronic kidney disease with heart failure and stage 1 through stage 4 chronic kidney disease, or unspecified chronic kidney disease: Secondary | ICD-10-CM | POA: Diagnosis not present

## 2023-02-23 DIAGNOSIS — Z853 Personal history of malignant neoplasm of breast: Secondary | ICD-10-CM | POA: Diagnosis not present

## 2023-02-23 DIAGNOSIS — J449 Chronic obstructive pulmonary disease, unspecified: Secondary | ICD-10-CM | POA: Diagnosis not present

## 2023-02-24 ENCOUNTER — Encounter: Payer: Self-pay | Admitting: Hematology and Oncology

## 2023-02-24 ENCOUNTER — Other Ambulatory Visit: Payer: Self-pay | Admitting: Hematology and Oncology

## 2023-02-24 MED ORDER — APIXABAN 5 MG PO TABS: 5.0000 mg | ORAL_TABLET | Freq: Two times a day (BID) | ORAL | 1 refills | Status: DC

## 2023-02-24 MED ORDER — APIXABAN 2.5 MG PO TABS: 5.0000 mg | ORAL_TABLET | Freq: Two times a day (BID) | ORAL | 1 refills | Status: DC

## 2023-02-25 DIAGNOSIS — N183 Chronic kidney disease, stage 3 unspecified: Secondary | ICD-10-CM | POA: Diagnosis not present

## 2023-02-25 DIAGNOSIS — I5030 Unspecified diastolic (congestive) heart failure: Secondary | ICD-10-CM | POA: Diagnosis not present

## 2023-02-25 DIAGNOSIS — J9621 Acute and chronic respiratory failure with hypoxia: Secondary | ICD-10-CM | POA: Diagnosis not present

## 2023-02-25 DIAGNOSIS — D631 Anemia in chronic kidney disease: Secondary | ICD-10-CM | POA: Diagnosis not present

## 2023-02-25 DIAGNOSIS — I130 Hypertensive heart and chronic kidney disease with heart failure and stage 1 through stage 4 chronic kidney disease, or unspecified chronic kidney disease: Secondary | ICD-10-CM | POA: Diagnosis not present

## 2023-02-25 DIAGNOSIS — J449 Chronic obstructive pulmonary disease, unspecified: Secondary | ICD-10-CM | POA: Diagnosis not present

## 2023-02-26 ENCOUNTER — Other Ambulatory Visit: Payer: Self-pay

## 2023-02-26 NOTE — Progress Notes (Signed)
Specialty Pharmacy Refill Coordination Note  Stacey [name] is a 83 y.o. female contacted today regarding refills of specialty medication(s) Palbociclib [name]   Delivery date: 03/08/23   Verified address: [address]  [name] [id number]   Medication will be filled on 01.31.25.

## 2023-03-03 DIAGNOSIS — I130 Hypertensive heart and chronic kidney disease with heart failure and stage 1 through stage 4 chronic kidney disease, or unspecified chronic kidney disease: Secondary | ICD-10-CM | POA: Diagnosis not present

## 2023-03-03 DIAGNOSIS — J449 Chronic obstructive pulmonary disease, unspecified: Secondary | ICD-10-CM | POA: Diagnosis not present

## 2023-03-03 DIAGNOSIS — I5030 Unspecified diastolic (congestive) heart failure: Secondary | ICD-10-CM | POA: Diagnosis not present

## 2023-03-03 DIAGNOSIS — N183 Chronic kidney disease, stage 3 unspecified: Secondary | ICD-10-CM | POA: Diagnosis not present

## 2023-03-03 DIAGNOSIS — J9621 Acute and chronic respiratory failure with hypoxia: Secondary | ICD-10-CM | POA: Diagnosis not present

## 2023-03-03 DIAGNOSIS — D631 Anemia in chronic kidney disease: Secondary | ICD-10-CM | POA: Diagnosis not present

## 2023-03-04 ENCOUNTER — Inpatient Hospital Stay: Payer: Medicare Other

## 2023-03-04 ENCOUNTER — Inpatient Hospital Stay (HOSPITAL_BASED_OUTPATIENT_CLINIC_OR_DEPARTMENT_OTHER): Payer: Medicare Other | Admitting: Adult Health

## 2023-03-04 ENCOUNTER — Encounter: Payer: Self-pay | Admitting: Adult Health

## 2023-03-04 VITALS — BP 132/88 | HR 93 | Temp 98.1°F | Resp 17 | Wt 143.2 lb

## 2023-03-04 DIAGNOSIS — C50211 Malignant neoplasm of upper-inner quadrant of right female breast: Secondary | ICD-10-CM | POA: Diagnosis not present

## 2023-03-04 DIAGNOSIS — Z79811 Long term (current) use of aromatase inhibitors: Secondary | ICD-10-CM | POA: Diagnosis not present

## 2023-03-04 DIAGNOSIS — D630 Anemia in neoplastic disease: Secondary | ICD-10-CM

## 2023-03-04 DIAGNOSIS — C50912 Malignant neoplasm of unspecified site of left female breast: Secondary | ICD-10-CM | POA: Diagnosis not present

## 2023-03-04 DIAGNOSIS — Z170 Estrogen receptor positive status [ER+]: Secondary | ICD-10-CM

## 2023-03-04 DIAGNOSIS — M7989 Other specified soft tissue disorders: Secondary | ICD-10-CM | POA: Diagnosis not present

## 2023-03-04 DIAGNOSIS — C50212 Malignant neoplasm of upper-inner quadrant of left female breast: Secondary | ICD-10-CM

## 2023-03-04 DIAGNOSIS — C7951 Secondary malignant neoplasm of bone: Secondary | ICD-10-CM

## 2023-03-04 DIAGNOSIS — Z86718 Personal history of other venous thrombosis and embolism: Secondary | ICD-10-CM | POA: Diagnosis not present

## 2023-03-04 DIAGNOSIS — Z79818 Long term (current) use of other agents affecting estrogen receptors and estrogen levels: Secondary | ICD-10-CM | POA: Diagnosis not present

## 2023-03-04 LAB — CMP (CANCER CENTER ONLY)
ALT: 8 U/L (ref 0–44)
AST: 17 U/L (ref 15–41)
Albumin: 4.3 g/dL (ref 3.5–5.0)
Alkaline Phosphatase: 55 U/L (ref 38–126)
Anion gap: 9 (ref 5–15)
BUN: 23 mg/dL (ref 8–23)
CO2: 23 mmol/L (ref 22–32)
Calcium: 9.6 mg/dL (ref 8.9–10.3)
Chloride: 108 mmol/L (ref 98–111)
Creatinine: 1.5 mg/dL — ABNORMAL HIGH (ref 0.44–1.00)
GFR, Estimated: 35 mL/min — ABNORMAL LOW (ref >60)
Glucose, Bld: 108 mg/dL — ABNORMAL HIGH (ref 70–99)
Potassium: 4.1 mmol/L (ref 3.5–5.1)
Sodium: 140 mmol/L (ref 135–145)
Total Bilirubin: 0.3 mg/dL (ref 0.0–1.2)
Total Protein: 7.5 g/dL (ref 6.5–8.1)

## 2023-03-04 LAB — CBC WITH DIFFERENTIAL (CANCER CENTER ONLY)
Abs Immature Granulocytes: 0 10*3/uL (ref 0.00–0.07)
Basophils Absolute: 0 10*3/uL (ref 0.0–0.1)
Basophils Relative: 1 %
Eosinophils Absolute: 0 10*3/uL (ref 0.0–0.5)
Eosinophils Relative: 1 %
HCT: 28.4 % — ABNORMAL LOW (ref 36.0–46.0)
Hemoglobin: 9.1 g/dL — ABNORMAL LOW (ref 12.0–15.0)
Immature Granulocytes: 0 %
Lymphocytes Relative: 26 %
Lymphs Abs: 0.7 10*3/uL (ref 0.7–4.0)
MCH: 31.1 pg (ref 26.0–34.0)
MCHC: 32 g/dL (ref 30.0–36.0)
MCV: 96.9 fL (ref 80.0–100.0)
Monocytes Absolute: 0.2 10*3/uL (ref 0.1–1.0)
Monocytes Relative: 7 %
Neutro Abs: 1.8 10*3/uL (ref 1.7–7.7)
Neutrophils Relative %: 65 %
Platelet Count: 161 10*3/uL (ref 150–400)
RBC: 2.93 MIL/uL — ABNORMAL LOW (ref 3.87–5.11)
RDW: 17.2 % — ABNORMAL HIGH (ref 11.5–15.5)
Smear Review: NORMAL
WBC Count: 2.8 10*3/uL — ABNORMAL LOW (ref 4.0–10.5)
nRBC: 0 % (ref 0.0–0.2)

## 2023-03-04 LAB — MAGNESIUM: Magnesium: 1.5 mg/dL — ABNORMAL LOW (ref 1.7–2.4)

## 2023-03-04 MED ORDER — FUROSEMIDE 40 MG PO TABS: 40.0000 mg | ORAL_TABLET | Freq: Every day | ORAL | 0 refills | Status: AC | PRN

## 2023-03-04 MED ORDER — FULVESTRANT 250 MG/5ML IM SOSY
500.0000 mg | PREFILLED_SYRINGE | Freq: Once | INTRAMUSCULAR | Status: AC
  Administered 2023-03-04: 500 mg via INTRAMUSCULAR
  Filled 2023-03-04: qty 10

## 2023-03-04 NOTE — Progress Notes (Signed)
[name] Cancer Follow up:    Stacey Levins, MD 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   #2 status post 4 cycles of doxorubicin and cyclophosphamide,                          (i) followed by radiation therapy under the care of Dr. Irene [name].   #3 received tamoxifen for a total of seven years    RIGHT BREAST #4  S/P biopsy of the RIGHT breast upper inner quadrant on 11/14/10 showing invasive ductal carcinoma,, grade 2, estrogen receptor 85% and progesterone receptor 57% positive, Ki67 20%, HER2 not amplified.     #5 started neoadjuvant letrozole in November 2012; switched to tamoxifen as of February 2016 due to osteopenia concerns   #6  S/P right lumpectomy with sentinel lymph node biopsy on 09/03/11 for a ypT2, ypN1a, stage IIB invasive lobular carcinoma, grade 2,estrogen receptor 97% positive, progesterone receptor 12% positive, with no HER-2 amplification.   #7 status post right breast radiation therapy under the care of Dr. Kathrynn [name] from 10/19/2011 to 12/03/2011.    #8 did not meet criteria for genetic testing according to her insurance company.   #9 osteopenia, with a T score of -1.8 on DEXA scan at [facility] 03/07/2013             (a) status post multiple dental extractions and implants             (b) repeat bone density at [facility] 04/02/2015 shows a T score of -2.0   METASTATIC DISEASE: April 2021 (lymph nodes, bone)   #10:  left upper extremity lymphedema led to chest CT scan 05/09/2019 showing a 5.1 cm left subpectoral chest wall mass and thoracic lymphadenopathy              (a) CT biopsy of the chest wall mass 05/25/2019 confirms recurrent breast cancer, strongly estrogen and  progesterone receptor positive, HER-2 not amplified             (b) PET scan 05/30/2019 shows a left subpectoral mass measuring 5.4 cm, with significant regional and mediastinal adenopathy, sclerotic left scapular metastasis, but no liver or lung involvement.             (c) CA 27-29 is moderately informative   #11 anastrozole started 06/02/2019; palbociclib added 06/08/2019             (a) palbociclib taken irregularly for several months secondary to cytopenias             (b) palbociclib dose decreased to 100 mg daily 21 on 7 off October 2021             (c) CT of the chest with contrast 11/09/2019 shows mild disease progression (despite a continuing drop in the CA 27-29)             (d) fulvestrant added 11/23/2019             (e) repeat chest CT with contrast 09/09/2020 read as stable.   #12 denosumab/Xgeva starting 06/08/2019, to be repeated every 3 months due to hypocalcemia             (  a) treatment held after August 2022 dose secondary to ongoing dental issues   #13 genetics testing 06/15/2019 through the [facility] Common Hereditary Cancers Panel found no deleterious mutations in APC, ATM, AXIN2, BARD1, BMPR1A, BRCA1, BRCA2, BRIP1, CDH1, CDKN2A (p14ARF), CDKN2A (p16INK4a), CKD4, CHEK2, CTNNA1, DICER1, EPCAM (Deletion/duplication testing only), GREM1 (promoter region deletion/duplication testing only), KIT, MEN1, MLH1, MSH2, MSH3, MSH6, MUTYH, NBN, NF1, NHTL1, PALB2, PDGFRA, PMS2, POLD1, POLE, PTEN, RAD50, RAD51C, RAD51D, RNF43, SDHB, SDHC, SDHD, SMAD4, SMARCA4. STK11, TP53, TSC1, TSC2, and VHL.  The following genes were evaluated for sequence changes only: SDHA and HOXB13 c.251G>A variant only.  CURRENT THERAPY: Anastrozole, Fulvestrant, Palbociclib  INTERVAL HISTORY: Stacey [name] 83 y.o. female returns for follow-up prior to receiving fulvestrant.  She continues on anastrozole daily and is taking Palbociclib 3 weeks on and 1 week off every 28 days.  Next week she is due for her off week of  her palbociclib.    This issue has been persistent reflux she noted her Prilosec was increased from 20 mg to 40 mg and she is also started Pepcid.  She has also had worsening lower extremity swelling and wants to know if there is anything she can do for this.  She has taken Lasix before but none recently.   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   DVT (deep venous thrombosis) (HCC) 12/04/2022   Diarrhea 12/04/2022   Sepsis (HCC) 11/17/2022   History of DVT (deep vein thrombosis) 11/17/2022   Current chronic use of systemic steroids 11/17/2022   Tachypnea 11/17/2022   Pulmonary nodule 11/17/2022   Acute deep vein thrombosis (DVT) of femoral vein of right lower extremity (HCC) 04/13/2022   Acute upper respiratory infection 05/16/2021   Nasal dryness 02/04/2021   Chronic sinusitis 11/25/2020   Allergic rhinitis 07/08/2020   Hypomagnesemia 06/10/2020   Goals of care, counseling/discussion 06/21/2019   Family history of breast cancer    Family history of pancreatic cancer    Family history of uterine cancer    Family history of lymphoma    Family history of lung cancer    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HCC) 06/02/2019   Recurrent cancer of left breast (HCC) 05/15/2019   Pes anserine bursitis 03/31/2019   HTN (hypertension) 02/10/2019   Hyperglycemia 06/21/2018   Left carpal tunnel syndrome 02/22/2018    Rotator cuff arthropathy of left shoulder 09/20/2017   Arthritis of hand 08/23/2017   Pain in right hand 08/23/2017   Cervical radiculitis 04/09/2017   Left shoulder pain 04/09/2017   Dyspnea on exertion 05/14/2016   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   Lymphedema of left arm 01/14/2016   Left arm swelling 12/25/2015   SVT (supraventricular tachycardia) (HCC)    Left-sided carotid artery disease (HCC) 03/08/2015   Dizziness 01/24/2015   Urinary frequency 01/13/2015   Dizziness and giddiness 01/09/2015   Gallstones 02/21/2013   Malignant neoplasm of upper-inner quadrant  of left breast in female, estrogen receptor positive (HCC) 11/28/2012   Muscle cramping 09/12/2012   Right hip pain 09/12/2012   Lower back pain 09/12/2012   COPD GOLD I     Hx of radiation therapy    Right shoulder pain 09/14/2011   Preventative health care 07/29/2010   Skin lesion of left leg 07/29/2010   Paresthesia 07/29/2010   GERD 03/28/2010   Constipation 03/28/2010   Osteoarthrosis, 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   Anxiety state 05/03/2009   Hyperlipidemia 06/14/2007   FATIGUE 06/14/2007   Anemia 12/17/2006   Diverticulosis of colon 12/17/2006   Cough 12/17/2006   IRRITABLE BOWEL SYNDROME, HX OF 12/17/2006    is allergic to other, tape, aminoglycosides, bacitracin, bee venom, cephalexin, clindamycin/lincomycin, and codeine.  MEDICAL HISTORY: Past Medical History:  Diagnosis Date   ANEMIA-NOS    ANXIETY    Breast cancer (HCC) 1997 L, 2012 R   s/p chemo/xrt   COPD    resolved   DIVERTICULOSIS, COLON 2008   Dizziness    Family history of breast cancer    Family history of lung cancer    Family history of lymphoma    Family history of pancreatic cancer    Family history of uterine cancer    GERD    Hx of radiation therapy 10/19/11 -12/03/11   right breast   HYPERLIPIDEMIA    IRRITABLE BOWEL SYNDROME, HX OF    Left-sided carotid artery disease (HCC)    moderate left ICA  stenosis   OSTEOARTHRITIS, HAND    PSVT (paroxysmal supraventricular tachycardia) (HCC)    symptomatic on event monitor    SURGICAL HISTORY: Past Surgical History:  Procedure Laterality Date   ABDOMINAL HYSTERECTOMY     APPENDECTOMY     BREAST BIOPSY  11/14/10    r breast: inv, insitu mammary carcinoma w/calcif, er/pr +, her2 -   BREAST SURGERY     lumpectomy   CATARACT EXTRACTION     both eyes   ELECTROPHYSIOLOGIC STUDY N/A 05/10/2015   Procedure: SVT Ablation;  Surgeon: Will Jorja Loa, MD;  Location: [facility] INVASIVE CV LAB;  Service: Cardiovascular;  Laterality: N/A;   HERNIA REPAIR     inguinal herniorrhapy  1984   left   IR US GUIDE BX ASP/DRAIN  05/26/2019   rectal fissure repair     s/p benign breast biopsy  2003   right   s/p left foot surgury  2009   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   spiral fx left foot  2008   no surgury   TMJ ARTHROPLASTY  1989   TONGUE SURGERY     1988- to remove scar tissue growth    TONSILLECTOMY      SOCIAL HISTORY: Social History   Socioeconomic History   Marital status: Divorced    Spouse name: 2 Step-children   Number of children: 2   Years of education: Not on file   Highest education level: Bachelor's degree (e.g., BA, AB, BS)  Occupational History   Occupation: retired Training and development officer  Tobacco Use   Smoking status: Former    Current packs/day: 0.00    Average packs/day: 1 pack/day for 54.0 years (54.0 ttl pk-yrs)    Types: Cigarettes    Start date: 01/02/1962    Quit date: 01/03/2016    Years since quitting: 7.1   Smokeless tobacco: Never  Vaping Use   Vaping status: Never Used  Substance and Sexual Activity   Alcohol use: Yes  Comment: rare/ drinks socially   Drug use: No   Sexual activity: Never  Other Topics Concern   Not on file  Social History Narrative   Patient gets no regular exercise   No biological children   2 step children   Social Drivers of Health   Financial Resource Strain: Low Risk   (11/07/2022)   Overall Financial Resource Strain (CARDIA)    Difficulty of Paying Living Expenses: Not hard at all  Food Insecurity: No Food Insecurity (01/11/2023)   Hunger Vital Sign    Worried About [name] Out of Food in the Last Year: Never true    Ran Out of Food in the Last Year: Never true  Transportation Needs: No Transportation Needs (01/11/2023)   PRAPARE - Administrator, Civil Service (Medical): No    Lack of Transportation (Non-Medical): No  Physical Activity: Inactive (11/07/2022)   Exercise Vital Sign    Days of Exercise per Week: 0 days    Minutes of Exercise per Session: 0 min  Stress: No Stress Concern Present (11/07/2022)   [employer] of Occupational Health - Occupational Stress Questionnaire    Feeling of Stress : Not at all  Social Connections: Socially Isolated (11/07/2022)   Social Connection and Isolation Panel [NHANES]    Frequency of Communication with Friends and Family: More than three times a week    Frequency of Social Gatherings with Friends and Family: Once a week    Attends Religious Services: Never    Database administrator or Organizations: No    Attends Banker Meetings: Never    Marital Status: Divorced  Catering manager Violence: Not At Risk (01/11/2023)   Humiliation, Afraid, Rape, and Kick questionnaire    Fear of Current or Ex-Partner: No    Emotionally Abused: No    Physically Abused: No    Sexually Abused: No    FAMILY HISTORY: Family History  Problem Relation Age of Onset   Hypertension Mother    Stroke Mother    Colon polyps Mother    Diabetes Mother    Pancreatic cancer Mother 68   Uterine cancer Mother 14   Lymphoma Brother        burkitts   Lung cancer Paternal Uncle    Lung cancer Maternal Grandmother 67       non-smoker   Lung cancer Maternal Grandfather    Breast cancer Cousin        maternal cousin, dx in her mid 10s   Brain cancer Cousin        maternal cousin's son; dx in his 72s    Testicular cancer Cousin        maternal cousin's son;    Breast cancer Cousin        paternal cousin; dx in her 1s   Breast cancer Cousin        paternal cousin's daughter; dx in 53s; neg genetic testing   Colon cancer Neg Hx    Esophageal cancer Neg Hx    Stomach cancer Neg Hx    Rectal cancer Neg Hx     Review of Systems  Constitutional:  Positive for fatigue. Negative for appetite change, chills, fever and unexpected weight change.  HENT:   Negative for hearing loss, lump/mass and trouble swallowing.   Eyes:  Negative for eye problems and icterus.  Respiratory:  Negative for chest tightness, cough and shortness of breath.   Cardiovascular:  Negative for chest pain, leg swelling and palpitations.  Gastrointestinal:  Negative for abdominal distention, abdominal pain, constipation, diarrhea, nausea and vomiting.  Endocrine: Negative for hot flashes.  Genitourinary:  Negative for difficulty urinating.   Musculoskeletal:  Negative for arthralgias.  Skin:  Negative for itching and rash.  Neurological:  Negative for dizziness, extremity weakness, headaches and numbness.  Hematological:  Negative for adenopathy. Does not bruise/bleed easily.  Psychiatric/Behavioral:  Negative for depression. The patient is not nervous/anxious.       PHYSICAL EXAMINATION    Vitals:   03/04/23 1323  BP: 132/88  Pulse: 93  Resp: 17  Temp: 98.1 F (36.7 C)  SpO2: 97%    Physical Exam Constitutional:      General: She is not in acute distress.    Appearance: Normal appearance. She is not toxic-appearing.     Comments: Examined in wheelchair, exam somewhat limited due to her being in wheelchair   HENT:     Head: Normocephalic and atraumatic.     Mouth/Throat:     Mouth: Mucous membranes are moist.     Pharynx: Oropharynx is clear. No oropharyngeal exudate or posterior oropharyngeal erythema.  Eyes:     General: No scleral icterus. Cardiovascular:     Rate and Rhythm: Normal rate and  regular rhythm.     Pulses: Normal pulses.     Heart sounds: Normal heart sounds.  Pulmonary:     Effort: Pulmonary effort is normal.     Breath sounds: Normal breath sounds.  Abdominal:     General: Abdomen is flat. Bowel sounds are normal. There is no distension.     Palpations: Abdomen is soft.     Tenderness: There is no abdominal tenderness.  Musculoskeletal:        General: No swelling.     Cervical back: Neck supple.  Lymphadenopathy:     Cervical: No cervical adenopathy.  Skin:    General: Skin is warm and dry.     Findings: No rash.  Neurological:     General: No focal deficit present.     Mental Status: She is alert.  Psychiatric:        Mood and Affect: Mood normal.        Behavior: Behavior normal.     LABORATORY DATA:  CBC    Component Value Date/Time   WBC 2.8 (L) 03/04/2023 1253   WBC 7.2 02/04/2023 1210   RBC 2.93 (L) 03/04/2023 1253   HGB 9.1 (L) 03/04/2023 1253   HGB 12.4 12/09/2015 1408   HCT 28.4 (L) 03/04/2023 1253   HCT 25.8 (L) 07/03/2022 1345   HCT 36.2 12/09/2015 1408   PLT 161 03/04/2023 1253   PLT 238 12/09/2015 1408   MCV 96.9 03/04/2023 1253   MCV 89.8 12/09/2015 1408   MCH 31.1 03/04/2023 1253   MCHC 32.0 03/04/2023 1253   RDW 17.2 (H) 03/04/2023 1253   RDW 13.2 12/09/2015 1408   LYMPHSABS 0.7 03/04/2023 1253   LYMPHSABS 2.1 12/09/2015 1408   MONOABS 0.2 03/04/2023 1253   MONOABS 0.5 12/09/2015 1408   EOSABS 0.0 03/04/2023 1253   EOSABS 0.2 12/09/2015 1408   BASOSABS 0.0 03/04/2023 1253   BASOSABS 0.0 12/09/2015 1408    CMP     Component Value Date/Time   NA 140 03/04/2023 1253   NA 140 12/09/2015 1409   K 4.1 03/04/2023 1253   K 4.2 12/09/2015 1409   CL 108 03/04/2023 1253   CL 107 02/02/2012 0939   CO2 23 03/04/2023 1253   CO2  25 12/09/2015 1409   GLUCOSE 108 (H) 03/04/2023 1253   GLUCOSE 102 12/09/2015 1409   GLUCOSE 104 (H) 02/02/2012 0939   BUN 23 03/04/2023 1253   BUN 16.7 12/09/2015 1409   CREATININE 1.50  (H) 03/04/2023 1253   CREATININE 0.8 12/09/2015 1409   CALCIUM 9.6 03/04/2023 1253   CALCIUM 9.4 12/09/2015 1409   PROT 7.5 03/04/2023 1253   PROT 7.0 12/09/2015 1409   ALBUMIN 4.3 03/04/2023 1253   ALBUMIN 3.3 (L) 12/09/2015 1409   AST 17 03/04/2023 1253   AST 20 12/09/2015 1409   ALT 8 03/04/2023 1253   ALT 13 12/09/2015 1409   ALKPHOS 55 03/04/2023 1253   ALKPHOS 54 12/09/2015 1409   BILITOT 0.3 03/04/2023 1253   BILITOT <0.22 12/09/2015 1409   GFRNONAA 35 (L) 03/04/2023 1253   GFRAA >60 10/26/2019 1347   GFRAA >60 05/09/2019 1303       ASSESSMENT and THERAPY PLAN:   Malignant neoplasm of upper-inner quadrant of left breast in female, estrogen receptor positive (HCC) Metastatic breast cancer.  No clinical signs of cancer progression.  On treatment with Palbociclib, Fulvestrant, and Anastrozole.  Labs today are stable. -Continue treatment with Anastrozole, Palbociclib and Fulvestrant. -Repeat scans in 05/2023  -f/u with Dr. Al Pimple in 03/2023 for labs, and injection  Reflux, currently on Omeprazole 40mg  daily. -Recommended consideration of adding Pepcid BID to regimen  Lower extremity swelling. I recommended brief trial of Lasix.  We reviewed that this can impact her electrolytes and she knows to continue her magnesium.  RTC in 4 weeks for labs, f/u, and injection.     All questions were answered. The patient knows to call the clinic with any problems, questions or concerns. We can certainly see the patient much sooner if necessary.  Total encounter time:40 minutes*in face-to-face visit time, chart review, lab review, care coordination, order entry, and documentation of the encounter time.    Lillard Anes, NP 03/08/23 3:00 PM Medical Oncology and Hematology [facility] [address] [place], [place] [address] Tel. [phone]    Fax. [phone]  *Total Encounter Time as defined by the Centers for Medicare and Medicaid Services includes, in  addition to the face-to-face time of a patient visit (documented in the note above) non-face-to-face time: obtaining and reviewing outside history, ordering and reviewing medications, tests or procedures, care coordination (communications with other health care professionals or caregivers) and documentation in the medical record.

## 2023-03-05 ENCOUNTER — Other Ambulatory Visit: Payer: Self-pay

## 2023-03-05 DIAGNOSIS — J449 Chronic obstructive pulmonary disease, unspecified: Secondary | ICD-10-CM | POA: Diagnosis not present

## 2023-03-05 DIAGNOSIS — D631 Anemia in chronic kidney disease: Secondary | ICD-10-CM | POA: Diagnosis not present

## 2023-03-05 DIAGNOSIS — N183 Chronic kidney disease, stage 3 unspecified: Secondary | ICD-10-CM | POA: Diagnosis not present

## 2023-03-05 DIAGNOSIS — I130 Hypertensive heart and chronic kidney disease with heart failure and stage 1 through stage 4 chronic kidney disease, or unspecified chronic kidney disease: Secondary | ICD-10-CM | POA: Diagnosis not present

## 2023-03-05 DIAGNOSIS — J9621 Acute and chronic respiratory failure with hypoxia: Secondary | ICD-10-CM | POA: Diagnosis not present

## 2023-03-05 DIAGNOSIS — I5030 Unspecified diastolic (congestive) heart failure: Secondary | ICD-10-CM | POA: Diagnosis not present

## 2023-03-08 ENCOUNTER — Encounter: Payer: Self-pay | Admitting: Oncology

## 2023-03-08 DIAGNOSIS — J9621 Acute and chronic respiratory failure with hypoxia: Secondary | ICD-10-CM | POA: Diagnosis not present

## 2023-03-08 DIAGNOSIS — I130 Hypertensive heart and chronic kidney disease with heart failure and stage 1 through stage 4 chronic kidney disease, or unspecified chronic kidney disease: Secondary | ICD-10-CM | POA: Diagnosis not present

## 2023-03-08 DIAGNOSIS — J449 Chronic obstructive pulmonary disease, unspecified: Secondary | ICD-10-CM | POA: Diagnosis not present

## 2023-03-08 DIAGNOSIS — N183 Chronic kidney disease, stage 3 unspecified: Secondary | ICD-10-CM | POA: Diagnosis not present

## 2023-03-08 DIAGNOSIS — I5030 Unspecified diastolic (congestive) heart failure: Secondary | ICD-10-CM | POA: Diagnosis not present

## 2023-03-08 DIAGNOSIS — D631 Anemia in chronic kidney disease: Secondary | ICD-10-CM | POA: Diagnosis not present

## 2023-03-08 NOTE — Assessment & Plan Note (Signed)
Metastatic breast cancer.  No clinical signs of cancer progression.  On treatment with Palbociclib, Fulvestrant, and Anastrozole.  Labs today are stable. -Continue treatment with Anastrozole, Palbociclib and Fulvestrant. -Repeat scans in 05/2023  -f/u with Dr. Al Pimple in 03/2023 for labs, and injection  Reflux, currently on Omeprazole 40mg  daily. -Recommended consideration of adding Pepcid BID to regimen  Lower extremity swelling. I recommended brief trial of Lasix.  We reviewed that this can impact her electrolytes and she knows to continue her magnesium.  RTC in 4 weeks for labs, f/u, and injection.

## 2023-03-10 ENCOUNTER — Encounter: Payer: Self-pay | Admitting: Adult Health

## 2023-03-11 ENCOUNTER — Encounter: Payer: Self-pay | Admitting: Oncology

## 2023-03-15 DIAGNOSIS — I5030 Unspecified diastolic (congestive) heart failure: Secondary | ICD-10-CM | POA: Diagnosis not present

## 2023-03-15 DIAGNOSIS — J9621 Acute and chronic respiratory failure with hypoxia: Secondary | ICD-10-CM | POA: Diagnosis not present

## 2023-03-15 DIAGNOSIS — N183 Chronic kidney disease, stage 3 unspecified: Secondary | ICD-10-CM | POA: Diagnosis not present

## 2023-03-15 DIAGNOSIS — I130 Hypertensive heart and chronic kidney disease with heart failure and stage 1 through stage 4 chronic kidney disease, or unspecified chronic kidney disease: Secondary | ICD-10-CM | POA: Diagnosis not present

## 2023-03-15 DIAGNOSIS — J449 Chronic obstructive pulmonary disease, unspecified: Secondary | ICD-10-CM | POA: Diagnosis not present

## 2023-03-15 DIAGNOSIS — D631 Anemia in chronic kidney disease: Secondary | ICD-10-CM | POA: Diagnosis not present

## 2023-03-20 DIAGNOSIS — N183 Chronic kidney disease, stage 3 unspecified: Secondary | ICD-10-CM | POA: Diagnosis not present

## 2023-03-20 DIAGNOSIS — I130 Hypertensive heart and chronic kidney disease with heart failure and stage 1 through stage 4 chronic kidney disease, or unspecified chronic kidney disease: Secondary | ICD-10-CM | POA: Diagnosis not present

## 2023-03-20 DIAGNOSIS — J449 Chronic obstructive pulmonary disease, unspecified: Secondary | ICD-10-CM | POA: Diagnosis not present

## 2023-03-20 DIAGNOSIS — D631 Anemia in chronic kidney disease: Secondary | ICD-10-CM | POA: Diagnosis not present

## 2023-03-20 DIAGNOSIS — J9621 Acute and chronic respiratory failure with hypoxia: Secondary | ICD-10-CM | POA: Diagnosis not present

## 2023-03-20 DIAGNOSIS — I5030 Unspecified diastolic (congestive) heart failure: Secondary | ICD-10-CM | POA: Diagnosis not present

## 2023-03-22 DIAGNOSIS — I5030 Unspecified diastolic (congestive) heart failure: Secondary | ICD-10-CM | POA: Diagnosis not present

## 2023-03-22 DIAGNOSIS — I130 Hypertensive heart and chronic kidney disease with heart failure and stage 1 through stage 4 chronic kidney disease, or unspecified chronic kidney disease: Secondary | ICD-10-CM | POA: Diagnosis not present

## 2023-03-22 DIAGNOSIS — J449 Chronic obstructive pulmonary disease, unspecified: Secondary | ICD-10-CM | POA: Diagnosis not present

## 2023-03-22 DIAGNOSIS — J9621 Acute and chronic respiratory failure with hypoxia: Secondary | ICD-10-CM | POA: Diagnosis not present

## 2023-03-22 DIAGNOSIS — D631 Anemia in chronic kidney disease: Secondary | ICD-10-CM | POA: Diagnosis not present

## 2023-03-22 DIAGNOSIS — N183 Chronic kidney disease, stage 3 unspecified: Secondary | ICD-10-CM | POA: Diagnosis not present

## 2023-03-31 ENCOUNTER — Other Ambulatory Visit: Payer: Self-pay

## 2023-03-31 NOTE — Progress Notes (Signed)
 Specialty Pharmacy Refill Coordination Note  Stacey [name] is a 83 y.o. female contacted today regarding refills of specialty medication(s) Palbociclib [name]   Delivery date: 04/05/23   Verified address: [address]  [name] [id number]   Medication will be filled on 04/02/23.

## 2023-04-02 ENCOUNTER — Inpatient Hospital Stay: Payer: Medicare Other

## 2023-04-02 ENCOUNTER — Other Ambulatory Visit: Payer: Self-pay

## 2023-04-02 ENCOUNTER — Inpatient Hospital Stay: Payer: Medicare Other | Attending: Adult Health | Admitting: Hematology and Oncology

## 2023-04-02 VITALS — BP 130/64 | HR 89 | Temp 98.2°F | Resp 16

## 2023-04-02 DIAGNOSIS — Z87891 Personal history of nicotine dependence: Secondary | ICD-10-CM | POA: Insufficient documentation

## 2023-04-02 DIAGNOSIS — C50912 Malignant neoplasm of unspecified site of left female breast: Secondary | ICD-10-CM | POA: Diagnosis not present

## 2023-04-02 DIAGNOSIS — Z79811 Long term (current) use of aromatase inhibitors: Secondary | ICD-10-CM | POA: Diagnosis not present

## 2023-04-02 DIAGNOSIS — I5032 Chronic diastolic (congestive) heart failure: Secondary | ICD-10-CM | POA: Diagnosis not present

## 2023-04-02 DIAGNOSIS — Z79818 Long term (current) use of other agents affecting estrogen receptors and estrogen levels: Secondary | ICD-10-CM | POA: Insufficient documentation

## 2023-04-02 DIAGNOSIS — Z803 Family history of malignant neoplasm of breast: Secondary | ICD-10-CM | POA: Insufficient documentation

## 2023-04-02 DIAGNOSIS — M8580 Other specified disorders of bone density and structure, unspecified site: Secondary | ICD-10-CM | POA: Insufficient documentation

## 2023-04-02 DIAGNOSIS — I130 Hypertensive heart and chronic kidney disease with heart failure and stage 1 through stage 4 chronic kidney disease, or unspecified chronic kidney disease: Secondary | ICD-10-CM | POA: Diagnosis not present

## 2023-04-02 DIAGNOSIS — Z801 Family history of malignant neoplasm of trachea, bronchus and lung: Secondary | ICD-10-CM | POA: Insufficient documentation

## 2023-04-02 DIAGNOSIS — Z86718 Personal history of other venous thrombosis and embolism: Secondary | ICD-10-CM | POA: Diagnosis not present

## 2023-04-02 DIAGNOSIS — Z923 Personal history of irradiation: Secondary | ICD-10-CM | POA: Insufficient documentation

## 2023-04-02 DIAGNOSIS — C7951 Secondary malignant neoplasm of bone: Secondary | ICD-10-CM

## 2023-04-02 DIAGNOSIS — D630 Anemia in neoplastic disease: Secondary | ICD-10-CM

## 2023-04-02 DIAGNOSIS — C50212 Malignant neoplasm of upper-inner quadrant of left female breast: Secondary | ICD-10-CM

## 2023-04-02 DIAGNOSIS — Z9221 Personal history of antineoplastic chemotherapy: Secondary | ICD-10-CM | POA: Diagnosis not present

## 2023-04-02 DIAGNOSIS — Z170 Estrogen receptor positive status [ER+]: Secondary | ICD-10-CM | POA: Diagnosis not present

## 2023-04-02 DIAGNOSIS — C50211 Malignant neoplasm of upper-inner quadrant of right female breast: Secondary | ICD-10-CM | POA: Insufficient documentation

## 2023-04-02 LAB — CBC WITH DIFFERENTIAL/PLATELET
Abs Immature Granulocytes: 0 10*3/uL (ref 0.00–0.07)
Basophils Absolute: 0 10*3/uL (ref 0.0–0.1)
Basophils Relative: 2 %
Eosinophils Absolute: 0.1 10*3/uL (ref 0.0–0.5)
Eosinophils Relative: 3 %
HCT: 28.4 % — ABNORMAL LOW (ref 36.0–46.0)
Hemoglobin: 9.5 g/dL — ABNORMAL LOW (ref 12.0–15.0)
Immature Granulocytes: 0 %
Lymphocytes Relative: 22 %
Lymphs Abs: 0.6 10*3/uL — ABNORMAL LOW (ref 0.7–4.0)
MCH: 32.6 pg (ref 26.0–34.0)
MCHC: 33.5 g/dL (ref 30.0–36.0)
MCV: 97.6 fL (ref 80.0–100.0)
Monocytes Absolute: 0.2 10*3/uL (ref 0.1–1.0)
Monocytes Relative: 8 %
Neutro Abs: 1.8 10*3/uL (ref 1.7–7.7)
Neutrophils Relative %: 65 %
Platelets: 181 10*3/uL (ref 150–400)
RBC: 2.91 MIL/uL — ABNORMAL LOW (ref 3.87–5.11)
RDW: 19 % — ABNORMAL HIGH (ref 11.5–15.5)
WBC: 2.7 10*3/uL — ABNORMAL LOW (ref 4.0–10.5)
nRBC: 0 % (ref 0.0–0.2)

## 2023-04-02 LAB — CMP (CANCER CENTER ONLY)
ALT: 9 U/L (ref 0–44)
AST: 18 U/L (ref 15–41)
Albumin: 4.4 g/dL (ref 3.5–5.0)
Alkaline Phosphatase: 54 U/L (ref 38–126)
Anion gap: 10 (ref 5–15)
BUN: 29 mg/dL — ABNORMAL HIGH (ref 8–23)
CO2: 23 mmol/L (ref 22–32)
Calcium: 9.7 mg/dL (ref 8.9–10.3)
Chloride: 107 mmol/L (ref 98–111)
Creatinine: 1.79 mg/dL — ABNORMAL HIGH (ref 0.44–1.00)
GFR, Estimated: 28 mL/min — ABNORMAL LOW (ref >60)
Glucose, Bld: 122 mg/dL — ABNORMAL HIGH (ref 70–99)
Potassium: 4.1 mmol/L (ref 3.5–5.1)
Sodium: 140 mmol/L (ref 135–145)
Total Bilirubin: 0.3 mg/dL (ref 0.0–1.2)
Total Protein: 7.8 g/dL (ref 6.5–8.1)

## 2023-04-02 LAB — MAGNESIUM: Magnesium: 1.9 mg/dL (ref 1.7–2.4)

## 2023-04-02 MED ORDER — FULVESTRANT 250 MG/5ML IM SOSY
500.0000 mg | PREFILLED_SYRINGE | Freq: Once | INTRAMUSCULAR | Status: AC
  Administered 2023-04-02: 500 mg via INTRAMUSCULAR
  Filled 2023-04-02: qty 10

## 2023-04-02 NOTE — Progress Notes (Signed)
 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   #2 status post 4 cycles of doxorubicin and cyclophosphamide,                          (i) followed by radiation therapy under the care of Dr. [name].   #3 received tamoxifen for a total of seven years    RIGHT BREAST #4  S/P biopsy of the RIGHT breast upper inner quadrant on 11/14/10 showing invasive ductal carcinoma,, grade 2, estrogen receptor 85% and progesterone receptor 57% positive, Ki67 20%, HER2 not amplified.     #5 started neoadjuvant letrozole in November 2012; switched to tamoxifen as of February 2016 due to osteopenia concerns   #6  S/P right lumpectomy with sentinel lymph node biopsy on 09/03/11 for a ypT2, ypN1a, stage IIB invasive lobular carcinoma, grade 2,estrogen receptor 97% positive, progesterone receptor 12% positive, with no HER-2 amplification.   #7 status post right breast radiation therapy under the care of Dr. [name] from 10/19/2011 to 12/03/2011.    #8 did not meet criteria for genetic testing according to her insurance company.   #9 osteopenia, with a T score of -1.8 on DEXA scan at [facility] 03/07/2013             (a) status post multiple dental extractions and implants             (b) repeat bone density at [facility] 04/02/2015 shows a T score of -2.0   METASTATIC DISEASE: April 2021 (lymph nodes, bone)   #10:  left upper extremity lymphedema led to chest CT scan 05/09/2019 showing a 5.1 cm left subpectoral chest wall mass and thoracic lymphadenopathy              (a) CT biopsy of the chest wall mass 05/25/2019 confirms recurrent breast cancer, strongly estrogen and  progesterone receptor positive, HER-2 not amplified             (b) PET scan 05/30/2019 shows a left subpectoral mass measuring 5.4 cm, with significant regional and mediastinal adenopathy, sclerotic left scapular metastasis, but no liver or lung involvement.             (c) CA 27-29 is moderately informative   #11 anastrozole started 06/02/2019; palbociclib added 06/08/2019             (a) palbociclib taken irregularly for several months secondary to cytopenias             (b) palbociclib dose decreased to 100 mg daily 21 on 7 off October 2021             (c) CT of the chest with contrast 11/09/2019 shows mild disease progression (despite a continuing drop in the CA 27-29)             (d) fulvestrant added 11/23/2019             (e) repeat chest CT with contrast 09/09/2020 read as stable.   #12 denosumab/Xgeva starting 06/08/2019, to be repeated every 3 months due to hypocalcemia             (  a) treatment held after August 2022 dose secondary to ongoing dental issues   #13 genetics testing 06/15/2019 through the [facility] Common Hereditary Cancers Panel found no deleterious mutations in APC, ATM, AXIN2, BARD1, BMPR1A, BRCA1, BRCA2, BRIP1, CDH1, CDKN2A (p14ARF), CDKN2A (p16INK4a), CKD4, CHEK2, CTNNA1, DICER1, EPCAM (Deletion/duplication testing only), GREM1 (promoter region deletion/duplication testing only), KIT, MEN1, MLH1, MSH2, MSH3, MSH6, MUTYH, NBN, NF1, NHTL1, PALB2, PDGFRA, PMS2, POLD1, POLE, PTEN, RAD50, RAD51C, RAD51D, RNF43, SDHB, SDHC, SDHD, SMAD4, SMARCA4. STK11, TP53, TSC1, TSC2, and VHL.  The following genes were evaluated for sequence changes only: SDHA and HOXB13 c.251G>A variant only.  CURRENT THERAPY: Anastrozole, Fulvestrant, Palbociclib  INTERVAL HISTORY: Stacey [name] 83 y.o. female returns for follow-up prior to receiving fulvestrant.  She continues on anastrozole daily and is taking Palbociclib 3 weeks on and 1 week off every 28 days.   She experiences persistent shortness  of breath and difficulty walking, losing her breath easily even over short distances. She uses a cane for support and notes that her oxygen saturation has been as low as 88% recently, although it is usually around 97-98%. She does not currently use supplemental oxygen but did while hospitalized.  She has a history of blood clots and is currently taking Eliquis 5 mg twice daily. A recent scan showed a blood clot in the right leg, and she is concerned about swelling in her left leg, which began during a recent hospitalization where she received significant fluid administration. She questions whether there might be a clot in the left leg as well, although no scan has been done for that leg.  She has been experiencing diarrhea, which has improved with a reduction in Ibrance dosage to 75 mg. She continues to take anastrozole and receives regular injections. Her diarrhea is not as severe as it was previously, although she still experiences loose stools.  She is concerned about her physical condition post-hospitalization, noting that her breathing and physical capabilities have not returned to pre-hospitalization levels. She no longer receives physical therapy but attempts to follow the exercises taught to her, although she struggles to do them twice daily as recommended. She uses a treadmill at home but finds it less effective than physical therapy.  Rest of the pertinent 10 point ROS reviewed and neg  Patient Active Problem List   Diagnosis Date Noted   Diastolic CHF (HCC) 02/09/2023   CKD (chronic kidney disease), stage III (HCC) 01/12/2023   Orthostatic hypotension 01/12/2023   AKI (acute kidney injury) (HCC) 01/01/2023   AMS (altered mental status) 12/31/2022   Altered mental status 12/30/2022   Acute on chronic diastolic heart failure (HCC) 12/26/2022   Hypokalemia 12/26/2022   Acute on chronic hypoxic respiratory failure (HCC) 12/05/2022   Metabolic acidosis 12/04/2022   Hypocalcemia 12/04/2022    Fatigue 12/04/2022   Pneumonia 12/04/2022   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   DVT (deep venous thrombosis) (HCC) 12/04/2022   Diarrhea 12/04/2022   Sepsis (HCC) 11/17/2022   History of DVT (deep vein thrombosis) 11/17/2022   Current chronic use of systemic steroids 11/17/2022   Tachypnea 11/17/2022   Pulmonary nodule 11/17/2022   Acute deep vein thrombosis (DVT) of femoral vein of right lower extremity (HCC) 04/13/2022   Acute upper respiratory infection 05/16/2021   Nasal dryness 02/04/2021   Chronic sinusitis 11/25/2020   Allergic rhinitis 07/08/2020   Hypomagnesemia 06/10/2020   Goals of care, counseling/discussion 06/21/2019   Family history of breast cancer    Family history of  pancreatic cancer    Family history of uterine cancer    Family history of lymphoma    Family history of lung cancer    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HCC) 06/02/2019   Recurrent cancer of left breast (HCC) 05/15/2019   Pes anserine bursitis 03/31/2019   HTN (hypertension) 02/10/2019   Hyperglycemia 06/21/2018   Left carpal tunnel syndrome 02/22/2018   Rotator cuff arthropathy of left shoulder 09/20/2017   Arthritis of hand 08/23/2017   Pain in right hand 08/23/2017   Cervical radiculitis 04/09/2017   Left shoulder pain 04/09/2017   Dyspnea on exertion 05/14/2016   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   Lymphedema of left arm 01/14/2016   Left arm swelling 12/25/2015   SVT (supraventricular tachycardia) (HCC)    Left-sided carotid artery disease (HCC) 03/08/2015   Dizziness 01/24/2015   Urinary frequency 01/13/2015   Dizziness and giddiness 01/09/2015   Gallstones 02/21/2013   Malignant neoplasm of upper-inner quadrant of left breast in female, estrogen receptor positive (HCC) 11/28/2012   Muscle cramping 09/12/2012   Right hip pain 09/12/2012   Lower back pain 09/12/2012   COPD GOLD I     Hx of radiation therapy    Right  shoulder pain 09/14/2011   Preventative health care 07/29/2010   Skin lesion of left leg 07/29/2010   Paresthesia 07/29/2010   GERD 03/28/2010   Constipation 03/28/2010   Osteoarthrosis, 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   Anxiety state 05/03/2009   Hyperlipidemia 06/14/2007   FATIGUE 06/14/2007   Anemia 12/17/2006   Diverticulosis of colon 12/17/2006   Cough 12/17/2006   IRRITABLE BOWEL SYNDROME, HX OF 12/17/2006    is allergic to other, tape, aminoglycosides, bacitracin, bee venom, cephalexin, clindamycin/lincomycin, and codeine.  MEDICAL HISTORY: Past Medical History:  Diagnosis Date   ANEMIA-NOS    ANXIETY    Breast cancer (HCC) 1997 L, 2012 R   s/p chemo/xrt   COPD    resolved   DIVERTICULOSIS, COLON 2008   Dizziness    Family history of breast cancer    Family history of lung cancer    Family history of lymphoma    Family history of pancreatic cancer    Family history of uterine cancer    GERD    Hx of radiation therapy 10/19/11 -12/03/11   right breast   HYPERLIPIDEMIA    IRRITABLE BOWEL SYNDROME, HX OF    Left-sided carotid artery disease (HCC)    moderate left ICA stenosis   OSTEOARTHRITIS, HAND    PSVT (paroxysmal supraventricular tachycardia) (HCC)    symptomatic on event monitor    SURGICAL HISTORY: Past Surgical History:  Procedure Laterality Date   ABDOMINAL HYSTERECTOMY     APPENDECTOMY     BREAST BIOPSY  11/14/10    r breast: inv, insitu mammary carcinoma w/calcif, er/pr +, her2 -   BREAST SURGERY     lumpectomy   CATARACT EXTRACTION     both eyes   ELECTROPHYSIOLOGIC STUDY N/A 05/10/2015   Procedure: SVT Ablation;  Surgeon: Will Jorja Loa, MD;  Location: [facility]VASIVE [facility];  Service: Cardiovascular;  Laterality: N/A;   HERNIA REPAIR     inguinal herniorrhapy  1984   left   IR US GUIDE BX ASP/DRAIN  05/26/2019   rectal fissure repair     s/p benign breast biopsy  2003   right   s/p left foot surgury  2009   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  spiral fx left foot  2008   no surgury   TMJ ARTHROPLASTY  1989   TONGUE SURGERY     1988- to remove scar tissue growth    TONSILLECTOMY      SOCIAL HISTORY: Social History   Socioeconomic History   Marital status: Divorced    Spouse name: 2 Step-children   Number of children: 2   Years of education: Not on file   Highest education level: Bachelor's degree (e.g., BA, AB, BS)  Occupational History   Occupation: retired Training and development officer  Tobacco Use   Smoking status: Former    Current packs/day: 0.00    Average packs/day: 1 pack/day for 54.0 years (54.0 ttl pk-yrs)    Types: Cigarettes    Start date: 01/02/1962    Quit date: 01/03/2016    Years since quitting: 7.2   Smokeless tobacco: Never  Vaping Use   Vaping status: Never Used  Substance and Sexual Activity   Alcohol use: Yes    Comment: rare/ drinks socially   Drug use: No   Sexual activity: Never  Other Topics Concern   Not on file  Social History Narrative   Patient gets no regular exercise   No biological children   2 step children   Social Drivers of Corporate investment banker Strain: Low Risk  (11/07/2022)   Overall Financial Resource Strain (CARDIA)    Difficulty of Paying Living Expenses: Not hard at all  Food Insecurity: No Food Insecurity (01/11/2023)   Hunger Vital Sign    Worried About Running Out of Food in the Last Year: Never true    Ran Out of Food in the Last Year: Never true  Transportation Needs: No Transportation Needs (01/11/2023)   PRAPARE - Administrator, Civil Service (Medical): No    Lack of Transportation (Non-Medical): No  Physical Activity: Inactive (11/07/2022)   Exercise Vital Sign    Days of Exercise per Week: 0 days    Minutes of Exercise per Session: 0 min  Stress: No Stress Concern Present (11/07/2022)   [employer] of Occupational Health - Occupational Stress Questionnaire    Feeling of Stress : Not at all  Social Connections: Socially Isolated (11/07/2022)    Social Connection and Isolation Panel [NHANES]    Frequency of Communication with Friends and Family: More than three times a week    Frequency of Social Gatherings with Friends and Family: Once a week    Attends Religious Services: Never    Database administrator or Organizations: No    Attends Banker Meetings: Never    Marital Status: Divorced  Catering manager Violence: Not At Risk (01/11/2023)   Humiliation, Afraid, Rape, and Kick questionnaire    Fear of Current or Ex-Partner: No    Emotionally Abused: No    Physically Abused: No    Sexually Abused: No    FAMILY HISTORY: Family History  Problem Relation Age of Onset   Hypertension Mother    Stroke Mother    Colon polyps Mother    Diabetes Mother    Pancreatic cancer Mother 9   Uterine cancer Mother 12   Lymphoma Brother        burkitts   Lung cancer Paternal Uncle    Lung cancer Maternal Grandmother 70       non-smoker   Lung cancer Maternal Grandfather    Breast cancer Cousin        maternal cousin, dx in her mid 4s  Brain cancer Cousin        maternal cousin's son; dx in his 68s   Testicular cancer Cousin        maternal cousin's son;    Breast cancer Cousin        paternal cousin; dx in her 33s   Breast cancer Cousin        paternal cousin's daughter; dx in 74s; neg genetic testing   Colon cancer Neg Hx    Esophageal cancer Neg Hx    Stomach cancer Neg Hx    Rectal cancer Neg Hx     Review of Systems  Constitutional:  Positive for fatigue. Negative for appetite change, chills, fever and unexpected weight change.  HENT:   Negative for hearing loss, lump/mass and trouble swallowing.   Eyes:  Negative for eye problems and icterus.  Respiratory:  Negative for chest tightness, cough and shortness of breath.   Cardiovascular:  Negative for chest pain, leg swelling and palpitations.  Gastrointestinal:  Negative for abdominal distention, abdominal pain, constipation, diarrhea, nausea and vomiting.   Endocrine: Negative for hot flashes.  Genitourinary:  Negative for difficulty urinating.   Musculoskeletal:  Negative for arthralgias.  Skin:  Negative for itching and rash.  Neurological:  Negative for dizziness, extremity weakness, headaches and numbness.  Hematological:  Negative for adenopathy. Does not bruise/bleed easily.  Psychiatric/Behavioral:  Negative for depression. The patient is not nervous/anxious.       PHYSICAL EXAMINATION    Vitals:   04/02/23 1253  BP: 130/64  Pulse: 89  Resp: 16  Temp: 98.2 F (36.8 C)  SpO2: 96%     Physical Exam Constitutional:      General: She is not in acute distress.    Appearance: Normal appearance. She is not toxic-appearing.     Comments: Pt got out of wheel chair for exam   HENT:     Head: Normocephalic and atraumatic.  Eyes:     General: No scleral icterus. Cardiovascular:     Rate and Rhythm: Normal rate and regular rhythm.     Pulses: Normal pulses.     Heart sounds: Normal heart sounds.  Pulmonary:     Effort: Pulmonary effort is normal. No respiratory distress.     Breath sounds: Normal breath sounds. No wheezing or rales.  Abdominal:     General: Abdomen is flat. Bowel sounds are normal. There is no distension.     Palpations: Abdomen is soft.     Tenderness: There is no abdominal tenderness.  Musculoskeletal:        General: No swelling.     Cervical back: Neck supple.  Lymphadenopathy:     Cervical: No cervical adenopathy.  Skin:    General: Skin is warm and dry.     Findings: No rash.  Neurological:     General: No focal deficit present.     Mental Status: She is alert.  Psychiatric:        Mood and Affect: Mood normal.        Behavior: Behavior normal.     LABORATORY DATA:  CBC    Component Value Date/Time   WBC 2.7 (L) 04/02/2023 1226   RBC 2.91 (L) 04/02/2023 1226   HGB 9.5 (L) 04/02/2023 1226   HGB 9.1 (L) 03/04/2023 1253   HGB 12.4 12/09/2015 1408   HCT 28.4 (L) 04/02/2023 1226    HCT 25.8 (L) 07/03/2022 1345   HCT 36.2 12/09/2015 1408   PLT 181 04/02/2023 1226  PLT 161 03/04/2023 1253   PLT 238 12/09/2015 1408   MCV 97.6 04/02/2023 1226   MCV 89.8 12/09/2015 1408   MCH 32.6 04/02/2023 1226   MCHC 33.5 04/02/2023 1226   RDW 19.0 (H) 04/02/2023 1226   RDW 13.2 12/09/2015 1408   LYMPHSABS 0.6 (L) 04/02/2023 1226   LYMPHSABS 2.1 12/09/2015 1408   MONOABS 0.2 04/02/2023 1226   MONOABS 0.5 12/09/2015 1408   EOSABS 0.1 04/02/2023 1226   EOSABS 0.2 12/09/2015 1408   BASOSABS 0.0 04/02/2023 1226   BASOSABS 0.0 12/09/2015 1408    CMP     Component Value Date/Time   NA 140 04/02/2023 1226   NA 140 12/09/2015 1409   K 4.1 04/02/2023 1226   K 4.2 12/09/2015 1409   CL 107 04/02/2023 1226   CL 107 02/02/2012 0939   CO2 23 04/02/2023 1226   CO2 25 12/09/2015 1409   GLUCOSE 122 (H) 04/02/2023 1226   GLUCOSE 102 12/09/2015 1409   GLUCOSE 104 (H) 02/02/2012 0939   BUN 29 (H) 04/02/2023 1226   BUN 16.7 12/09/2015 1409   CREATININE 1.79 (H) 04/02/2023 1226   CREATININE 0.8 12/09/2015 1409   CALCIUM 9.7 04/02/2023 1226   CALCIUM 9.4 12/09/2015 1409   PROT 7.8 04/02/2023 1226   PROT 7.0 12/09/2015 1409   ALBUMIN 4.4 04/02/2023 1226   ALBUMIN 3.3 (L) 12/09/2015 1409   AST 18 04/02/2023 1226   AST 20 12/09/2015 1409   ALT 9 04/02/2023 1226   ALT 13 12/09/2015 1409   ALKPHOS 54 04/02/2023 1226   ALKPHOS 54 12/09/2015 1409   BILITOT 0.3 04/02/2023 1226   BILITOT <0.22 12/09/2015 1409   GFRNONAA 28 (L) 04/02/2023 1226   GFRAA >60 10/26/2019 1347   GFRAA >60 05/09/2019 1303       ASSESSMENT and THERAPY PLAN:   Malignant neoplasm of upper-inner quadrant of left breast in female, estrogen receptor positive [facility]) This is a very pleasant 83 year old female patient with metastatic breast cancer currently on palbociclib, and anastrozole as well as fulvestrant since about May 2021 who is here for follow-up. 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  Breast Cancer Stable disease on Ibrance 75mg  and Anastrozole. Improved diarrhea with dose reduction of Ibrance. -Continue Ibrance 75mg  and Anastrozole. -Administer Faslodex injection today. -Order PET scan for mid-March to assess disease status.  Breathlessness and Fatigue Persistent despite improvement in diarrhea. No evidence of fluid overload or pneumonitis. Oxygen saturation occasionally drops to 88%. -Cardiology consultation in 1-2 weeks to evaluate potential cardiac causes of breathlessness.  Deep Vein Thrombosis (DVT) Right-sided DVT with Eliquis treatment.  -Continue Eliquis 5mg  twice daily.  General Health Maintenance  -Schedule follow-up appointment for next month. -Faslodex every 28 days.  All questions were answered. The patient knows to call the clinic with any problems, questions or concerns. We can certainly see the patient much sooner if necessary.  Total encounter time:30 minutes*in face-to-face visit time, chart review, lab review, care coordination, order entry, and documentation of the encounter time.   *Total Encounter Time as defined by the Centers for Medicare and Medicaid Services includes, in addition to the face-to-face time of a patient visit (documented in the note above) non-face-to-face time: obtaining and reviewing outside history, ordering and reviewing medications, tests or procedures, care coordination (communications with other health care professionals or caregivers) and documentation in the medical record.

## 2023-04-02 NOTE — Assessment & Plan Note (Signed)
 This is a very pleasant 84 year old female patient with metastatic breast cancer currently on palbociclib, and anastrozole as well as fulvestrant since about May 2021 who is here for follow-up. 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  Breast Cancer Stable disease on Ibrance 75mg  and Anastrozole. Improved diarrhea with dose reduction of Ibrance. -Continue Ibrance 75mg  and Anastrozole. -Administer Faslodex injection today. -Order PET scan for mid-March to assess disease status.  Breathlessness and Fatigue Persistent despite improvement in diarrhea. No evidence of fluid overload or pneumonitis. Oxygen saturation occasionally drops to 88%. -Cardiology consultation in 1-2 weeks to evaluate potential cardiac causes of breathlessness.  Deep Vein Thrombosis (DVT) Right-sided DVT with Eliquis treatment.  -Continue Eliquis 5mg  twice daily.  General Health Maintenance  -Schedule follow-up appointment for next month. -Faslodex every 28 days.

## 2023-04-06 ENCOUNTER — Other Ambulatory Visit: Payer: Self-pay | Admitting: Hematology and Oncology

## 2023-04-08 ENCOUNTER — Encounter: Payer: Self-pay | Admitting: Oncology

## 2023-04-09 ENCOUNTER — Encounter: Payer: Self-pay | Admitting: Cardiovascular Disease

## 2023-04-09 ENCOUNTER — Ambulatory Visit: Payer: Medicare Other | Attending: Cardiovascular Disease | Admitting: Cardiovascular Disease

## 2023-04-09 VITALS — BP 152/66 | HR 81 | Ht 64.0 in | Wt 141.0 lb

## 2023-04-09 DIAGNOSIS — I5033 Acute on chronic diastolic (congestive) heart failure: Secondary | ICD-10-CM | POA: Diagnosis not present

## 2023-04-09 DIAGNOSIS — I951 Orthostatic hypotension: Secondary | ICD-10-CM

## 2023-04-09 DIAGNOSIS — Z86718 Personal history of other venous thrombosis and embolism: Secondary | ICD-10-CM

## 2023-04-09 DIAGNOSIS — I6522 Occlusion and stenosis of left carotid artery: Secondary | ICD-10-CM | POA: Diagnosis not present

## 2023-04-09 DIAGNOSIS — E781 Pure hyperglyceridemia: Secondary | ICD-10-CM | POA: Diagnosis not present

## 2023-04-09 DIAGNOSIS — I5030 Unspecified diastolic (congestive) heart failure: Secondary | ICD-10-CM | POA: Diagnosis not present

## 2023-04-09 NOTE — Progress Notes (Signed)
 04/09/2023 Stacey [name]   12-08-1940  [id number]  Primary Physician Jonny Ruiz Len Blalock, MD Primary Cardiologist: Runell Gess MD Milagros Loll, [place], [place]  HPI:  Stacey [name] is a 83 y.o.  mildly overweight divorced Caucasian female no children. 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 She was referred by Dr. Jonny Ruiz for evaluation of episodic dizziness which has left several years. The last saw her in the office 07/15/2018. Her cardiac risk factor profile is notable for 50-pack-years of tobacco abuse currently smoking one pack per day but otherwise is negative. She's never had a heart attack or stroke and denies chest pain or shortness of breath. 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 She's had chemotherapy and radiation therapy. The episodes occur several times a month. The last several seconds at a time. There is no associated syncope. She had an event monitor that showed PSVT and carotid Dopplers that showed bilateral ICA stenosis in the mild-to-moderate range. She had attempt at SVT ablation by Dr. Elberta Fortis 05/10/15. He was unable to induce the arrhythmia however since that time her symptoms have somewhat improved. She discontinue smoking February 2018. Her major issue however is increasing dyspnea on exertion. She did have a normal 2-D echo performed 02/08/15.    She had a 2D echo 05/25/2017 which was entirely normal except for grade 1 diastolic dysfunction and a Myoview stress test that was nonischemic.    Since I saw her 5 years ago she was diagnosed with breast cancer which is metastatic.  She sees an oncologist and is taking chemotherapy.  She gets occasional palpitations which resolved spontaneously.  She was hospitalized late last year with pneumonia and sepsis.  Since that time she is noticed increasing dyspnea on exertion.  2D echo performed  during her hospitalization 12/05/2022 revealed a normal LV systolic function with grade 1 diastolic dysfunction and no valvular abnormalities.  I am not convinced that her shortness of breath is cardiovascular nature.  She was diagnosed with a right lower extremity DVT in her common femoral vein and has been placed on Eliquis oral anticoagulation.   Current Meds  Medication Sig   acetaminophen (TYLENOL) 500 MG tablet Take 1,000 mg by mouth every 8 (eight) hours as needed for mild pain (pain score 1-3) or moderate pain (pain score 4-6).   anastrozole (ARIMIDEX) 1 MG tablet TAKE 1 TABLET(1 MG) BY MOUTH DAILY   apixaban (ELIQUIS) 5 MG TABS tablet Take 1 tablet (5 mg total) by mouth 2 (two) times daily.   CALCIUM CARBONATE-VITAMIN D PO Take 1 tablet by mouth 2 (two) times daily.   cholecalciferol (VITAMIN D3) 25 MCG (1000 UNIT) tablet Take 1,000 Units by mouth daily.   loperamide (IMODIUM A-D) 2 MG tablet Take 2 mg by mouth 4 (four) times daily as needed for diarrhea or loose stools.   loratadine (CLARITIN) 10 MG tablet Take 10 mg by mouth every morning.   methocarbamol (ROBAXIN) 500 MG tablet Take 500 mg by mouth every 8 (eight) hours as needed for muscle spasms.   Multiple Vitamins-Minerals (MULTIVITAMIN WITH MINERALS) tablet Take 1 tablet by mouth in the morning.   omeprazole (PRILOSEC) 40 MG capsule Take 1 capsule (40 mg total) by mouth daily.   palbociclib (IBRANCE) 75 MG tablet Take 1 tablet (75 mg total) by mouth daily. Take for 21  days on, 7 days off, repeat every 28 days.   triamcinolone (NASACORT) 55 MCG/ACT AERO nasal inhaler Place 2 sprays into the nose daily. 2 sprays each nostril at night before bedtime (Patient taking differently: Place 2 sprays into the nose See admin instructions. Instill 2 sprays into each nostril every night before bedtime)   vitamin B-12 (CYANOCOBALAMIN) 100 MCG tablet Take 100 mcg by mouth daily.   zolpidem (AMBIEN) 10 MG tablet Take 10 mg by mouth at bedtime as  needed for sleep.     Allergies  Allergen Reactions   Other Nausea Only and Other (See Comments)    A lot of antibiotics cause extreme nausea   Tape Other (See Comments)    PAPER TAPE IS MUCH PREFERRED   Aminoglycosides Other (See Comments)    Unknown reaction - pt is not sure where this entry came from   Bacitracin Other (See Comments)    Reaction not recalled   Bee Venom Swelling and Other (See Comments)    Severe body swelling   Cephalexin Nausea Only    Other Reaction(s): Not available   Clindamycin/Lincomycin Diarrhea and Nausea And Vomiting   Codeine Nausea And Vomiting and Other (See Comments)    Pt can take codeine cough syrup    Social History   Socioeconomic History   Marital status: Divorced    Spouse name: 2 Step-children   Number of children: 2   Years of education: Not on file   Highest education level: Bachelor's degree (e.g., BA, AB, BS)  Occupational History   Occupation: retired Training and development officer  Tobacco Use   Smoking status: Former    Current packs/day: 0.00    Average packs/day: 1 pack/day for 54.0 years (54.0 ttl pk-yrs)    Types: Cigarettes    Start date: 01/02/1962    Quit date: 01/03/2016    Years since quitting: 7.2   Smokeless tobacco: Never  Vaping Use   Vaping status: Never Used  Substance and Sexual Activity   Alcohol use: Yes    Comment: rare/ drinks socially   Drug use: No   Sexual activity: Never  Other Topics Concern   Not on file  Social History Narrative   Patient gets no regular exercise   No biological children   2 step children   Social Drivers of Corporate investment banker Strain: Low Risk  (11/07/2022)   Overall Financial Resource Strain (CARDIA)    Difficulty of Paying Living Expenses: Not hard at all  Food Insecurity: No Food Insecurity (01/11/2023)   Hunger Vital Sign    Worried About Running Out of Food in the Last Year: Never true    Ran Out of Food in the Last Year: Never true  Transportation Needs: No Transportation  Needs (01/11/2023)   PRAPARE - Administrator, Civil Service (Medical): No    Lack of Transportation (Non-Medical): No  Physical Activity: Inactive (11/07/2022)   Exercise Vital Sign    Days of Exercise per Week: 0 days    Minutes of Exercise per Session: 0 min  Stress: No Stress Concern Present (11/07/2022)   [employer] of Occupational Health - Occupational Stress Questionnaire    Feeling of Stress : Not at all  Social Connections: Socially Isolated (11/07/2022)   Social Connection and Isolation Panel [NHANES]    Frequency of Communication with Friends and Family: More than three times a week    Frequency of Social Gatherings with Friends and Family: Once a week  Attends Religious Services: Never    Active Member of Clubs or Organizations: No    Attends Banker Meetings: Never    Marital Status: Divorced  Catering manager Violence: Not At Risk (01/11/2023)   Humiliation, Afraid, Rape, and Kick questionnaire    Fear of Current or Ex-Partner: No    Emotionally Abused: No    Physically Abused: No    Sexually Abused: No     Review of Systems: General: negative for chills, fever, night sweats or weight changes.  Cardiovascular: negative for chest pain, dyspnea on exertion, edema, orthopnea, palpitations, paroxysmal nocturnal dyspnea or shortness of breath Dermatological: negative for rash Respiratory: negative for cough or wheezing Urologic: negative for hematuria Abdominal: negative for nausea, vomiting, diarrhea, bright red blood per rectum, melena, or hematemesis Neurologic: negative for visual changes, syncope, or dizziness All other systems reviewed and are otherwise negative except as noted above.    Blood pressure (!) 152/66, pulse 81, height 5\' 4"  (1.626 m), weight 141 lb (64 kg), SpO2 100%.  General appearance: alert and no distress Neck: no adenopathy, no carotid bruit, no JVD, supple, symmetrical, trachea midline, and thyroid not enlarged,  symmetric, no tenderness/mass/nodules Lungs: clear to auscultation bilaterally Heart: regular rate and rhythm, S1, S2 normal, no murmur, click, rub or gallop Extremities: 1+ edema bilaterally Pulses: 2+ and symmetric Skin: Skin color, texture, turgor normal. 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  Venous Dopplers performed 02/05/2023 confirmed this.  Hyperlipidemia History of hyperlipidemia not on statin therapy followed by her PCP.  Her direct LDL was 119.  Diastolic CHF (HCC) Diastolic heart failure history of diastolic heart failure with 2D echo performed 12/05/2022 revealing normal LV systolic function with grade 1 diastolic dysfunction.  She was on oral diuretic briefly for swelling that that did not improve her shortness of breath.     Runell Gess MD FACP,FACC,FAHA, [place] 04/09/2023 3:58 PM

## 2023-04-09 NOTE — Assessment & Plan Note (Signed)
 Diastolic heart failure history of diastolic heart failure with 2D echo performed 12/05/2022 revealing normal LV systolic function with grade 1 diastolic dysfunction.  She was on oral diuretic briefly for swelling that that did not improve her shortness of breath.

## 2023-04-09 NOTE — Assessment & Plan Note (Addendum)
 History of hyperlipidemia not on statin therapy followed by her PCP.  Her direct LDL was 119.

## 2023-04-09 NOTE — Patient Instructions (Signed)
 Medication Instructions:  Your physician recommends that you continue on your current medications as directed. Please refer to the Current Medication list given to you today.  *If you need a refill on your cardiac medications before your next appointment, please call your pharmacy*   Follow-Up: At [facility], you and your health needs are our priority.  As part of our continuing mission to provide you with exceptional heart care, we have created designated Provider Care Teams.  These Care Teams include your primary Cardiologist (physician) and Advanced Practice Providers (APPs -  Physician Assistants and Nurse Practitioners) who all work together to provide you with the care you need, when you need it.  We recommend signing up for the patient portal called "MyChart".  Sign up information is provided on this After Visit Summary.  MyChart is used to connect with patients for Virtual Visits (Telemedicine).  Patients are able to view lab/test results, encounter notes, upcoming appointments, etc.  Non-urgent messages can be sent to your provider as well.   To learn more about what you can do with MyChart, go to [email or web address].    Your next appointment:   6 month(s)  Provider:   Marjie Skiff, PA-C, Robet Leu, PA-C, Azalee Course, PA-C, Bernadene Person, NP, or Reather Littler, NP       Then, Nanetta Batty, MD will plan to see you again in 12 month(s).    Other Instructions   1st Floor: - Lobby - Registration  - Pharmacy  - Lab - Cafe  2nd Floor: - PV Lab - Diagnostic Testing (echo, CT, nuclear med)  3rd Floor: - Vacant  4th Floor: - TCTS (cardiothoracic surgery) - AFib Clinic - Structural Heart Clinic - Vascular Surgery  - Vascular Ultrasound  5th Floor: - [facility] (general and EP) - Clinical Pharmacy for coumadin, hypertension, lipid, weight-loss medications, and med management appointments    Valet parking services will be available as well.

## 2023-04-09 NOTE — Assessment & Plan Note (Signed)
 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  Venous Dopplers performed 02/05/2023 confirmed this.

## 2023-04-12 ENCOUNTER — Telehealth (INDEPENDENT_AMBULATORY_CARE_PROVIDER_SITE_OTHER): Payer: Self-pay | Admitting: Otolaryngology

## 2023-04-12 NOTE — Telephone Encounter (Signed)
 Patient called in and wanted to schedule a follow-up appt with Dr. Irene Pap, and was wondering if she needed to get the sinus ct done before scheduling appt with Dr. Irene Pap - or if she can be seen first.  (She had to cancel her previous appt and CT scan due to being hospitalized.)  Please advise.  [phone]

## 2023-04-13 ENCOUNTER — Telehealth (INDEPENDENT_AMBULATORY_CARE_PROVIDER_SITE_OTHER): Payer: Self-pay

## 2023-04-13 DIAGNOSIS — H353121 Nonexudative age-related macular degeneration, left eye, early dry stage: Secondary | ICD-10-CM | POA: Diagnosis not present

## 2023-04-13 DIAGNOSIS — H353112 Nonexudative age-related macular degeneration, right eye, intermediate dry stage: Secondary | ICD-10-CM | POA: Diagnosis not present

## 2023-04-13 DIAGNOSIS — H40042 Steroid responder, left eye: Secondary | ICD-10-CM | POA: Diagnosis not present

## 2023-04-13 DIAGNOSIS — H40013 Open angle with borderline findings, low risk, bilateral: Secondary | ICD-10-CM | POA: Diagnosis not present

## 2023-04-13 NOTE — Telephone Encounter (Signed)
 Left a message for the patient with the CT scheduling number, I advised her to schedule this as this test would give further information as to what may be going on and it will give Dr Irene Pap more information as how to treat her symptoms

## 2023-04-15 ENCOUNTER — Telehealth: Payer: Self-pay | Admitting: Hematology and Oncology

## 2023-04-15 NOTE — Telephone Encounter (Signed)
 Patient called in to scheduled future appointments and injections, patient is aware of scheduled appointment times/dates; patient has callback number for scheduling if needing to cancel or reschedule appointments

## 2023-04-16 ENCOUNTER — Encounter (HOSPITAL_COMMUNITY)
Admission: RE | Admit: 2023-04-16 | Discharge: 2023-04-16 | Disposition: A | Discharge status: Home / Self Care | Source: Ambulatory Visit | Attending: Hematology and Oncology | Admitting: Hematology and Oncology

## 2023-04-16 DIAGNOSIS — Z170 Estrogen receptor positive status [ER+]: Secondary | ICD-10-CM | POA: Diagnosis not present

## 2023-04-16 DIAGNOSIS — C50919 Malignant neoplasm of unspecified site of unspecified female breast: Secondary | ICD-10-CM | POA: Diagnosis not present

## 2023-04-16 DIAGNOSIS — C50912 Malignant neoplasm of unspecified site of left female breast: Secondary | ICD-10-CM | POA: Insufficient documentation

## 2023-04-16 DIAGNOSIS — C50212 Malignant neoplasm of upper-inner quadrant of left female breast: Secondary | ICD-10-CM | POA: Diagnosis not present

## 2023-04-16 LAB — GLUCOSE, CAPILLARY: Glucose-Capillary: 105 mg/dL — ABNORMAL HIGH (ref 70–99)

## 2023-04-16 MED ORDER — FLUDEOXYGLUCOSE F - 18 (FDG) INJECTION
7.0500 | Freq: Once | INTRAVENOUS | Status: AC
  Administered 2023-04-16: 7.05 via INTRAVENOUS

## 2023-04-19 DIAGNOSIS — H40042 Steroid responder, left eye: Secondary | ICD-10-CM | POA: Diagnosis not present

## 2023-04-19 DIAGNOSIS — H40013 Open angle with borderline findings, low risk, bilateral: Secondary | ICD-10-CM | POA: Diagnosis not present

## 2023-04-22 ENCOUNTER — Ambulatory Visit (HOSPITAL_COMMUNITY)
Admission: RE | Admit: 2023-04-22 | Discharge: 2023-04-22 | Disposition: A | Discharge status: Home / Self Care | Source: Ambulatory Visit | Attending: Otolaryngology | Admitting: Otolaryngology

## 2023-04-22 DIAGNOSIS — J329 Chronic sinusitis, unspecified: Secondary | ICD-10-CM | POA: Insufficient documentation

## 2023-04-24 DIAGNOSIS — I471 Supraventricular tachycardia, unspecified: Secondary | ICD-10-CM | POA: Diagnosis not present

## 2023-04-24 DIAGNOSIS — K5730 Diverticulosis of large intestine without perforation or abscess without bleeding: Secondary | ICD-10-CM | POA: Diagnosis not present

## 2023-04-24 DIAGNOSIS — K219 Gastro-esophageal reflux disease without esophagitis: Secondary | ICD-10-CM | POA: Diagnosis not present

## 2023-04-24 DIAGNOSIS — D696 Thrombocytopenia, unspecified: Secondary | ICD-10-CM | POA: Diagnosis not present

## 2023-04-24 DIAGNOSIS — E785 Hyperlipidemia, unspecified: Secondary | ICD-10-CM | POA: Diagnosis not present

## 2023-04-24 DIAGNOSIS — Z7901 Long term (current) use of anticoagulants: Secondary | ICD-10-CM | POA: Diagnosis not present

## 2023-04-24 DIAGNOSIS — Z87891 Personal history of nicotine dependence: Secondary | ICD-10-CM | POA: Diagnosis not present

## 2023-04-24 DIAGNOSIS — F419 Anxiety disorder, unspecified: Secondary | ICD-10-CM | POA: Diagnosis not present

## 2023-04-24 DIAGNOSIS — N319 Neuromuscular dysfunction of bladder, unspecified: Secondary | ICD-10-CM | POA: Diagnosis not present

## 2023-04-24 DIAGNOSIS — D631 Anemia in chronic kidney disease: Secondary | ICD-10-CM | POA: Diagnosis not present

## 2023-04-24 DIAGNOSIS — I5030 Unspecified diastolic (congestive) heart failure: Secondary | ICD-10-CM | POA: Diagnosis not present

## 2023-04-24 DIAGNOSIS — I6522 Occlusion and stenosis of left carotid artery: Secondary | ICD-10-CM | POA: Diagnosis not present

## 2023-04-24 DIAGNOSIS — Z87440 Personal history of urinary (tract) infections: Secondary | ICD-10-CM | POA: Diagnosis not present

## 2023-04-24 DIAGNOSIS — J449 Chronic obstructive pulmonary disease, unspecified: Secondary | ICD-10-CM | POA: Diagnosis not present

## 2023-04-24 DIAGNOSIS — C50212 Malignant neoplasm of upper-inner quadrant of left female breast: Secondary | ICD-10-CM | POA: Diagnosis not present

## 2023-04-24 DIAGNOSIS — N183 Chronic kidney disease, stage 3 unspecified: Secondary | ICD-10-CM | POA: Diagnosis not present

## 2023-04-24 DIAGNOSIS — I130 Hypertensive heart and chronic kidney disease with heart failure and stage 1 through stage 4 chronic kidney disease, or unspecified chronic kidney disease: Secondary | ICD-10-CM | POA: Diagnosis not present

## 2023-04-24 DIAGNOSIS — Z556 Problems related to health literacy: Secondary | ICD-10-CM | POA: Diagnosis not present

## 2023-04-24 DIAGNOSIS — I4891 Unspecified atrial fibrillation: Secondary | ICD-10-CM | POA: Diagnosis not present

## 2023-04-24 DIAGNOSIS — J9621 Acute and chronic respiratory failure with hypoxia: Secondary | ICD-10-CM | POA: Diagnosis not present

## 2023-04-27 ENCOUNTER — Telehealth: Payer: Self-pay

## 2023-04-27 NOTE — Telephone Encounter (Signed)
 Spoke with patient and confirmed visits for 3/26

## 2023-04-28 ENCOUNTER — Inpatient Hospital Stay

## 2023-04-28 ENCOUNTER — Inpatient Hospital Stay: Attending: Adult Health | Admitting: Hematology and Oncology

## 2023-04-28 VITALS — BP 119/56 | HR 104 | Temp 98.1°F | Resp 17 | Wt 140.6 lb

## 2023-04-28 DIAGNOSIS — C7951 Secondary malignant neoplasm of bone: Secondary | ICD-10-CM

## 2023-04-28 DIAGNOSIS — Z170 Estrogen receptor positive status [ER+]: Secondary | ICD-10-CM | POA: Insufficient documentation

## 2023-04-28 DIAGNOSIS — E785 Hyperlipidemia, unspecified: Secondary | ICD-10-CM | POA: Diagnosis not present

## 2023-04-28 DIAGNOSIS — C50212 Malignant neoplasm of upper-inner quadrant of left female breast: Secondary | ICD-10-CM

## 2023-04-28 DIAGNOSIS — I5032 Chronic diastolic (congestive) heart failure: Secondary | ICD-10-CM | POA: Insufficient documentation

## 2023-04-28 DIAGNOSIS — J449 Chronic obstructive pulmonary disease, unspecified: Secondary | ICD-10-CM | POA: Insufficient documentation

## 2023-04-28 DIAGNOSIS — C50211 Malignant neoplasm of upper-inner quadrant of right female breast: Secondary | ICD-10-CM | POA: Insufficient documentation

## 2023-04-28 DIAGNOSIS — K580 Irritable bowel syndrome with diarrhea: Secondary | ICD-10-CM | POA: Diagnosis not present

## 2023-04-28 DIAGNOSIS — Z79810 Long term (current) use of selective estrogen receptor modulators (SERMs): Secondary | ICD-10-CM | POA: Diagnosis not present

## 2023-04-28 DIAGNOSIS — I890 Lymphedema, not elsewhere classified: Secondary | ICD-10-CM | POA: Insufficient documentation

## 2023-04-28 DIAGNOSIS — I130 Hypertensive heart and chronic kidney disease with heart failure and stage 1 through stage 4 chronic kidney disease, or unspecified chronic kidney disease: Secondary | ICD-10-CM | POA: Diagnosis not present

## 2023-04-28 DIAGNOSIS — M8580 Other specified disorders of bone density and structure, unspecified site: Secondary | ICD-10-CM | POA: Insufficient documentation

## 2023-04-28 DIAGNOSIS — Z803 Family history of malignant neoplasm of breast: Secondary | ICD-10-CM | POA: Diagnosis not present

## 2023-04-28 DIAGNOSIS — Z79818 Long term (current) use of other agents affecting estrogen receptors and estrogen levels: Secondary | ICD-10-CM | POA: Diagnosis not present

## 2023-04-28 DIAGNOSIS — N183 Chronic kidney disease, stage 3 unspecified: Secondary | ICD-10-CM | POA: Diagnosis not present

## 2023-04-28 DIAGNOSIS — Z806 Family history of leukemia: Secondary | ICD-10-CM | POA: Insufficient documentation

## 2023-04-28 DIAGNOSIS — Z87891 Personal history of nicotine dependence: Secondary | ICD-10-CM | POA: Diagnosis not present

## 2023-04-28 DIAGNOSIS — Z801 Family history of malignant neoplasm of trachea, bronchus and lung: Secondary | ICD-10-CM | POA: Insufficient documentation

## 2023-04-28 DIAGNOSIS — Z800 Family history of malignant neoplasm of digestive organs: Secondary | ICD-10-CM | POA: Diagnosis not present

## 2023-04-28 DIAGNOSIS — Z86718 Personal history of other venous thrombosis and embolism: Secondary | ICD-10-CM | POA: Insufficient documentation

## 2023-04-28 DIAGNOSIS — D630 Anemia in neoplastic disease: Secondary | ICD-10-CM

## 2023-04-28 LAB — CMP (CANCER CENTER ONLY)
ALT: 9 U/L (ref 0–44)
AST: 19 U/L (ref 15–41)
Albumin: 4.5 g/dL (ref 3.5–5.0)
Alkaline Phosphatase: 52 U/L (ref 38–126)
Anion gap: 11 (ref 5–15)
BUN: 34 mg/dL — ABNORMAL HIGH (ref 8–23)
CO2: 21 mmol/L — ABNORMAL LOW (ref 22–32)
Calcium: 9.8 mg/dL (ref 8.9–10.3)
Chloride: 111 mmol/L (ref 98–111)
Creatinine: 1.71 mg/dL — ABNORMAL HIGH (ref 0.44–1.00)
GFR, Estimated: 29 mL/min — ABNORMAL LOW (ref >60)
Glucose, Bld: 119 mg/dL — ABNORMAL HIGH (ref 70–99)
Potassium: 4.2 mmol/L (ref 3.5–5.1)
Sodium: 143 mmol/L (ref 135–145)
Total Bilirubin: 0.4 mg/dL (ref 0.0–1.2)
Total Protein: 7.9 g/dL (ref 6.5–8.1)

## 2023-04-28 LAB — CBC WITH DIFFERENTIAL (CANCER CENTER ONLY)
Abs Immature Granulocytes: 0.01 10*3/uL (ref 0.00–0.07)
Basophils Absolute: 0 10*3/uL (ref 0.0–0.1)
Basophils Relative: 1 %
Eosinophils Absolute: 0.1 10*3/uL (ref 0.0–0.5)
Eosinophils Relative: 2 %
HCT: 29.2 % — ABNORMAL LOW (ref 36.0–46.0)
Hemoglobin: 9.6 g/dL — ABNORMAL LOW (ref 12.0–15.0)
Immature Granulocytes: 0 %
Lymphocytes Relative: 18 %
Lymphs Abs: 0.7 10*3/uL (ref 0.7–4.0)
MCH: 32.4 pg (ref 26.0–34.0)
MCHC: 32.9 g/dL (ref 30.0–36.0)
MCV: 98.6 fL (ref 80.0–100.0)
Monocytes Absolute: 0.3 10*3/uL (ref 0.1–1.0)
Monocytes Relative: 8 %
Neutro Abs: 2.7 10*3/uL (ref 1.7–7.7)
Neutrophils Relative %: 71 %
Platelet Count: 176 10*3/uL (ref 150–400)
RBC: 2.96 MIL/uL — ABNORMAL LOW (ref 3.87–5.11)
RDW: 18.3 % — ABNORMAL HIGH (ref 11.5–15.5)
WBC Count: 3.8 10*3/uL — ABNORMAL LOW (ref 4.0–10.5)
nRBC: 0 % (ref 0.0–0.2)

## 2023-04-28 LAB — MAGNESIUM: Magnesium: 1.9 mg/dL (ref 1.7–2.4)

## 2023-04-28 MED ORDER — FULVESTRANT 250 MG/5ML IM SOSY
500.0000 mg | PREFILLED_SYRINGE | Freq: Once | INTRAMUSCULAR | Status: AC
  Administered 2023-04-28: 500 mg via INTRAMUSCULAR
  Filled 2023-04-28: qty 10

## 2023-04-28 NOTE — Assessment & Plan Note (Signed)
 This is a very pleasant 83 year old female patient with metastatic breast cancer currently on palbociclib, and anastrozole as well as fulvestrant since about May 2021 who is here for follow-up. 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  Breast Cancer 5 mm left supraclavicular lymph node noted on most recent PET scan. - Consult radiologist regarding imaging options without contrast. - per discussion with radiology, CT chest without contrast would suffice, ordered this in 3 months. - Consider radiation therapy if node remains only active area in next scan.  Chronic kidney disease Elevated creatinine at 1.71. Stable.  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 Ophthalmologist recommended laser surgery, but she prefers eye drops. - Continue current eye drop regimen with one drop at night. - Follow up with ophthalmologist on Monday. - Discuss potential laser surgery with ophthalmologist if eye pressure does not improve.  Hypertension Reported elevated blood pressure, recently improved. Stress may have contributed. - Monitor blood pressure regularly.  Diarrhea Diarrhea improved, stabilizing magnesium levels at 1.9. - Monitor for recurrence of diarrhea. - Monitor magnesium levels.  Follow-up Multiple follow-up appointments scheduled with specialists. - Schedule follow-up appointments as needed. - Coordinate with specialists for ongoing care.

## 2023-04-28 NOTE — Progress Notes (Unsigned)
 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   #2 status post 4 cycles of doxorubicin and cyclophosphamide,                          (i) followed by radiation therapy under the care of Dr. [name].   #3 received tamoxifen for a total of seven years    RIGHT BREAST #4  S/P biopsy of the RIGHT breast upper inner quadrant on 11/14/10 showing invasive ductal carcinoma,, grade 2, estrogen receptor 85% and progesterone receptor 57% positive, Ki67 20%, HER2 not amplified.     #5 started neoadjuvant letrozole in November 2012; switched to tamoxifen as of February 2016 due to osteopenia concerns   #6  S/P right lumpectomy with sentinel lymph node biopsy on 09/03/11 for a ypT2, ypN1a, stage IIB invasive lobular carcinoma, grade 2,estrogen receptor 97% positive, progesterone receptor 12% positive, with no HER-2 amplification.   #7 status post right breast radiation therapy under the care of Dr. [name] from 10/19/2011 to 12/03/2011.    #8 did not meet criteria for genetic testing according to her insurance company.   #9 osteopenia, with a T score of -1.8 on DEXA scan at [facility] 03/07/2013             (a) status post multiple dental extractions and implants             (b) repeat bone density at [facility] 04/02/2015 shows a T score of -2.0   METASTATIC DISEASE: April 2021 (lymph nodes, bone)   #10:  left upper extremity lymphedema led to chest CT scan 05/09/2019 showing a 5.1 cm left subpectoral chest wall mass and thoracic lymphadenopathy              (a) CT biopsy of the chest wall mass 05/25/2019 confirms recurrent breast cancer, strongly estrogen and  progesterone receptor positive, HER-2 not amplified             (b) PET scan 05/30/2019 shows a left subpectoral mass measuring 5.4 cm, with significant regional and mediastinal adenopathy, sclerotic left scapular metastasis, but no liver or lung involvement.             (c) CA 27-29 is moderately informative   #11 anastrozole started 06/02/2019; palbociclib added 06/08/2019             (a) palbociclib taken irregularly for several months secondary to cytopenias             (b) palbociclib dose decreased to 100 mg daily 21 on 7 off October 2021             (c) CT of the chest with contrast 11/09/2019 shows mild disease progression (despite a continuing drop in the CA 27-29)             (d) fulvestrant added 11/23/2019             (e) repeat chest CT with contrast 09/09/2020 read as stable.   #12 denosumab/Xgeva starting 06/08/2019, to be repeated every 3 months due to hypocalcemia             (  a) treatment held after August 2022 dose secondary to ongoing dental issues   #13 genetics testing 06/15/2019 through the [facility] Common Hereditary Cancers Panel found no deleterious mutations in APC, ATM, AXIN2, BARD1, BMPR1A, BRCA1, BRCA2, BRIP1, CDH1, CDKN2A (p14ARF), CDKN2A (p16INK4a), CKD4, CHEK2, CTNNA1, DICER1, EPCAM (Deletion/duplication testing only), GREM1 (promoter region deletion/duplication testing only), KIT, MEN1, MLH1, MSH2, MSH3, MSH6, MUTYH, NBN, NF1, NHTL1, PALB2, PDGFRA, PMS2, POLD1, POLE, PTEN, RAD50, RAD51C, RAD51D, RNF43, SDHB, SDHC, SDHD, SMAD4, SMARCA4. STK11, TP53, TSC1, TSC2, and VHL.  The following genes were evaluated for sequence changes only: SDHA and HOXB13 c.251G>A variant only.  CURRENT THERAPY: Anastrozole, Fulvestrant, Palbociclib  INTERVAL HISTORY: Stacey [name] is an 83 year old female who presents with elevated eye pressure and concerns about a supraclavicular lymph node.  She recently visited her eye doctor and was informed that her eye pressure was over 40 mmHg,  significantly higher than the normal range in the twenties. She attributes this increase to the use of Nasacort, a nasal spray containing steroids, which she used twice daily. She has since discontinued Nasacort and is now on a new eye drop medication, administered as one drop at night, to manage her eye pressure.  A recent PET scan revealed a small lymph node in the supraclavicular region, measuring about five millimeters, which is too small to biopsy. She expresses concern about the lymph node, noting that supraclavicular lymph nodes are generally not benign.  Her creatinine levels have been elevated since her hospitalization, indicating chronic kidney disease, which limits her ability to receive contrast for imaging studies. This affects her ability to undergo certain diagnostic procedures.  She experienced elevated blood pressure for several weeks, which has recently started to decrease. She believes stress may have contributed to the elevated readings.  She has been experiencing stress and mentions that her diarrhea has improved, which she believes has helped stabilize her magnesium levels.  She was hospitalized from October to December, which she humorously refers to as 'incarceration,' and missed several routine appointments, including a sinus scan for her ENT doctor.  She has seen a cardiologist who confirmed her heart is healthy, and she has an upcoming appointment with a pulmonologist in May.  Rest of the pertinent 10 point ROS reviewed and neg  Patient Active Problem List   Diagnosis Date Noted   Diastolic CHF (HCC) 02/09/2023   CKD (chronic kidney disease), stage III (HCC) 01/12/2023   Orthostatic hypotension 01/12/2023   AKI (acute kidney injury) (HCC) 01/01/2023   AMS (altered mental status) 12/31/2022   Altered mental status 12/30/2022   Acute on chronic diastolic heart failure (HCC) 12/26/2022   Hypokalemia 12/26/2022   Acute on chronic hypoxic respiratory failure (HCC)  12/05/2022   Metabolic acidosis 12/04/2022   Hypocalcemia 12/04/2022   Fatigue 12/04/2022   Pneumonia 12/04/2022   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   DVT (deep venous thrombosis) (HCC) 12/04/2022   Diarrhea 12/04/2022   Sepsis (HCC) 11/17/2022   History of DVT (deep vein thrombosis) 11/17/2022   Current chronic use of systemic steroids 11/17/2022   Tachypnea 11/17/2022   Pulmonary nodule 11/17/2022   Acute deep vein thrombosis (DVT) of femoral vein of right lower extremity (HCC) 04/13/2022   Acute upper respiratory infection 05/16/2021   Nasal dryness 02/04/2021   Chronic sinusitis 11/25/2020   Allergic rhinitis 07/08/2020   Hypomagnesemia 06/10/2020   Goals of care, counseling/discussion 06/21/2019   Family history of breast cancer    Family history of pancreatic  cancer    Family history of uterine cancer    Family history of lymphoma    Family history of lung cancer    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HCC) 06/02/2019   Recurrent cancer of left breast (HCC) 05/15/2019   Pes anserine bursitis 03/31/2019   HTN (hypertension) 02/10/2019   Hyperglycemia 06/21/2018   Left carpal tunnel syndrome 02/22/2018   Rotator cuff arthropathy of left shoulder 09/20/2017   Arthritis of hand 08/23/2017   Pain in right hand 08/23/2017   Cervical radiculitis 04/09/2017   Left shoulder pain 04/09/2017   Dyspnea on exertion 05/14/2016   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   Lymphedema of left arm 01/14/2016   Left arm swelling 12/25/2015   SVT (supraventricular tachycardia) (HCC)    Left-sided carotid artery disease (HCC) 03/08/2015   Dizziness 01/24/2015   Urinary frequency 01/13/2015   Dizziness and giddiness 01/09/2015   Gallstones 02/21/2013   Malignant neoplasm of upper-inner quadrant of left breast in female, estrogen receptor positive (HCC) 11/28/2012   Muscle cramping 09/12/2012   Right hip pain 09/12/2012   Lower back  pain 09/12/2012   COPD GOLD I     Hx of radiation therapy    Right shoulder pain 09/14/2011   Preventative health care 07/29/2010   Skin lesion of left leg 07/29/2010   Paresthesia 07/29/2010   GERD 03/28/2010   Constipation 03/28/2010   Osteoarthrosis, 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   Anxiety state 05/03/2009   Hyperlipidemia 06/14/2007   FATIGUE 06/14/2007   Anemia 12/17/2006   Diverticulosis of colon 12/17/2006   Cough 12/17/2006   IRRITABLE BOWEL SYNDROME, HX OF 12/17/2006    is allergic to other, tape, aminoglycosides, bacitracin, bee venom, cephalexin, clindamycin/lincomycin, and codeine.  MEDICAL HISTORY: Past Medical History:  Diagnosis Date   ANEMIA-NOS    ANXIETY    Breast cancer (HCC) 1997 L, 2012 R   s/p chemo/xrt   COPD    resolved   DIVERTICULOSIS, COLON 2008   Dizziness    Family history of breast cancer    Family history of lung cancer    Family history of lymphoma    Family history of pancreatic cancer    Family history of uterine cancer    GERD    Hx of radiation therapy 10/19/11 -12/03/11   right breast   HYPERLIPIDEMIA    IRRITABLE BOWEL SYNDROME, HX OF    Left-sided carotid artery disease (HCC)    moderate left ICA stenosis   OSTEOARTHRITIS, HAND    PSVT (paroxysmal supraventricular tachycardia) (HCC)    symptomatic on event monitor    SURGICAL HISTORY: Past Surgical History:  Procedure Laterality Date   ABDOMINAL HYSTERECTOMY     APPENDECTOMY     BREAST BIOPSY  11/14/10    r breast: inv, insitu mammary carcinoma w/calcif, er/pr +, her2 -   BREAST SURGERY     lumpectomy   CATARACT EXTRACTION     both eyes   ELECTROPHYSIOLOGIC STUDY N/A 05/10/2015   Procedure: SVT Ablation;  Surgeon: Will Jorja Loa, Stacey [name];  Location: [facility] INVASIVE CV [facility];  Service: Cardiovascular;  Laterality: N/A;   HERNIA REPAIR     inguinal herniorrhapy  1984   left   IR US GUIDE BX ASP/DRAIN  05/26/2019   rectal fissure repair     s/p benign breast biopsy  2003   right   s/p  left foot surgury  2009   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  spiral fx left foot  2008   no surgury   TMJ ARTHROPLASTY  1989   TONGUE SURGERY     1988- to remove scar tissue growth    TONSILLECTOMY      SOCIAL HISTORY: Social History   Socioeconomic History   Marital status: Divorced    Spouse name: 2 Step-children   Number of children: 2   Years of education: Not on file   Highest education level: Bachelor's degree (e.g., BA, AB, BS)  Occupational History   Occupation: retired Training and development officer  Tobacco Use   Smoking status: Former    Current packs/day: 0.00    Average packs/day: 1 pack/day for 54.0 years (54.0 ttl pk-yrs)    Types: Cigarettes    Start date: 01/02/1962    Quit date: 01/03/2016    Years since quitting: 7.3   Smokeless tobacco: Never  Vaping Use   Vaping status: Never Used  Substance and Sexual Activity   Alcohol use: Yes    Comment: rare/ drinks socially   Drug use: No   Sexual activity: Never  Other Topics Concern   Not on file  Social History Narrative   Patient gets no regular exercise   No biological children   2 step children   Social Drivers of Corporate investment banker Strain: Low Risk  (11/07/2022)   Overall Financial Resource Strain (CARDIA)    Difficulty of Paying Living Expenses: Not hard at all  Food Insecurity: No Food Insecurity (01/11/2023)   Hunger Vital Sign    Worried About Running Out of Food in the Last Year: Never true    Ran Out of Food in the Last Year: Never true  Transportation Needs: No Transportation Needs (01/11/2023)   PRAPARE - Administrator, Civil Service (Medical): No    Lack of Transportation (Non-Medical): No  Physical Activity: Inactive (11/07/2022)   Exercise Vital Sign    Days of Exercise per Week: 0 days    Minutes of Exercise per Session: 0 min  Stress: No Stress Concern Present (11/07/2022)   [employer] of Occupational Health - Occupational Stress Questionnaire    Feeling of Stress :  Not at all  Social Connections: Socially Isolated (11/07/2022)   Social Connection and Isolation Panel [NHANES]    Frequency of Communication with Friends and Family: More than three times a week    Frequency of Social Gatherings with Friends and Family: Once a week    Attends Religious Services: Never    Database administrator or Organizations: No    Attends Banker Meetings: Never    Marital Status: Divorced  Catering manager Violence: Not At Risk (01/11/2023)   Humiliation, Afraid, Rape, and Kick questionnaire    Fear of Current or Ex-Partner: No    Emotionally Abused: No    Physically Abused: No    Sexually Abused: No    FAMILY HISTORY: Family History  Problem Relation Age of Onset   Hypertension Mother    Stroke Mother    Colon polyps Mother    Diabetes Mother    Pancreatic cancer Mother 85   Uterine cancer Mother 55   Lymphoma Brother        burkitts   Lung cancer Paternal Uncle    Lung cancer Maternal Grandmother 28       non-smoker   Lung cancer Maternal Grandfather    Breast cancer Cousin        maternal cousin, dx in her mid 51s  Brain cancer Cousin        maternal cousin's son; dx in his 50s   Testicular cancer Cousin        maternal cousin's son;    Breast cancer Cousin        paternal cousin; dx in her 67s   Breast cancer Cousin        paternal cousin's daughter; dx in 44s; neg genetic testing   Colon cancer Neg Hx    Esophageal cancer Neg Hx    Stomach cancer Neg Hx    Rectal cancer Neg Hx     Review of Systems  Constitutional:  Positive for fatigue. Negative for appetite change, chills, fever and unexpected weight change.  HENT:   Negative for hearing loss, lump/mass and trouble swallowing.   Eyes:  Negative for eye problems and icterus.  Respiratory:  Negative for chest tightness, cough and shortness of breath.   Cardiovascular:  Negative for chest pain, leg swelling and palpitations.  Gastrointestinal:  Negative for abdominal  distention, abdominal pain, constipation, diarrhea, nausea and vomiting.  Endocrine: Negative for hot flashes.  Genitourinary:  Negative for difficulty urinating.   Musculoskeletal:  Negative for arthralgias.  Skin:  Negative for itching and rash.  Neurological:  Negative for dizziness, extremity weakness, headaches and numbness.  Hematological:  Negative for adenopathy. Does not bruise/bleed easily.  Psychiatric/Behavioral:  Negative for depression. The patient is not nervous/anxious.       PHYSICAL EXAMINATION    Vitals:   04/28/23 1533  BP: (!) 119/56  Pulse: (!) 104  Resp: 17  Temp: 98.1 F (36.7 C)  SpO2: 93%     Physical Exam Constitutional:      General: She is not in acute distress.    Appearance: Normal appearance. She is not toxic-appearing.  HENT:     Head: Normocephalic and atraumatic.  Eyes:     General: No scleral icterus. Cardiovascular:     Rate and Rhythm: Normal rate and regular rhythm.     Pulses: Normal pulses.     Heart sounds: Normal heart sounds.  Pulmonary:     Effort: Pulmonary effort is normal. No respiratory distress.     Breath sounds: Normal breath sounds. No wheezing or rales.  Abdominal:     General: Abdomen is flat. Bowel sounds are normal. There is no distension.     Palpations: Abdomen is soft.     Tenderness: There is no abdominal tenderness.  Musculoskeletal:        General: No swelling.     Cervical back: Neck supple.  Lymphadenopathy:     Cervical: No cervical adenopathy.  Skin:    General: Skin is warm and dry.     Findings: No rash.  Neurological:     General: No focal deficit present.     Mental Status: She is alert.  Psychiatric:        Mood and Affect: Mood normal.        Behavior: Behavior normal.     LABORATORY DATA:  CBC    Component Value Date/Time   WBC 3.8 (L) 04/28/2023 1455   WBC 2.7 (L) 04/02/2023 1226   RBC 2.96 (L) 04/28/2023 1455   HGB 9.6 (L) 04/28/2023 1455   HGB 12.4 12/09/2015 1408   HCT  29.2 (L) 04/28/2023 1455   HCT 25.8 (L) 07/03/2022 1345   HCT 36.2 12/09/2015 1408   PLT 176 04/28/2023 1455   PLT 238 12/09/2015 1408   MCV 98.6 04/28/2023 1455  MCV 89.8 12/09/2015 1408   MCH 32.4 04/28/2023 1455   MCHC 32.9 04/28/2023 1455   RDW 18.3 (H) 04/28/2023 1455   RDW 13.2 12/09/2015 1408   LYMPHSABS 0.7 04/28/2023 1455   LYMPHSABS 2.1 12/09/2015 1408   MONOABS 0.3 04/28/2023 1455   MONOABS 0.5 12/09/2015 1408   EOSABS 0.1 04/28/2023 1455   EOSABS 0.2 12/09/2015 1408   BASOSABS 0.0 04/28/2023 1455   BASOSABS 0.0 12/09/2015 1408    CMP     Component Value Date/Time   NA 143 04/28/2023 1455   NA 140 12/09/2015 1409   K 4.2 04/28/2023 1455   K 4.2 12/09/2015 1409   CL 111 04/28/2023 1455   CL 107 02/02/2012 0939   CO2 21 (L) 04/28/2023 1455   CO2 25 12/09/2015 1409   GLUCOSE 119 (H) 04/28/2023 1455   GLUCOSE 102 12/09/2015 1409   GLUCOSE 104 (H) 02/02/2012 0939   BUN 34 (H) 04/28/2023 1455   BUN 16.7 12/09/2015 1409   CREATININE 1.71 (H) 04/28/2023 1455   CREATININE 0.8 12/09/2015 1409   CALCIUM 9.8 04/28/2023 1455   CALCIUM 9.4 12/09/2015 1409   PROT 7.9 04/28/2023 1455   PROT 7.0 12/09/2015 1409   ALBUMIN 4.5 04/28/2023 1455   ALBUMIN 3.3 (L) 12/09/2015 1409   AST 19 04/28/2023 1455   AST 20 12/09/2015 1409   ALT 9 04/28/2023 1455   ALT 13 12/09/2015 1409   ALKPHOS 52 04/28/2023 1455   ALKPHOS 54 12/09/2015 1409   BILITOT 0.4 04/28/2023 1455   BILITOT <0.22 12/09/2015 1409   GFRNONAA 29 (L) 04/28/2023 1455   GFRAA >60 10/26/2019 1347   GFRAA >60 05/09/2019 1303       ASSESSMENT and THERAPY PLAN:   Malignant neoplasm of upper-inner quadrant of left breast in female, estrogen receptor positive [facility]) This is a very pleasant 83 year old female patient with metastatic breast cancer currently on palbociclib, and anastrozole as well as fulvestrant since about May 2021 who is here for follow-up. 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  Breast Cancer 5 mm left supraclavicular lymph node noted on most recent PET scan. - Consult radiologist regarding imaging options without contrast. - per discussion with radiology, CT chest without contrast would suffice, ordered this in 3 months. - Consider radiation therapy if node remains only active area in next scan.  Chronic kidney disease Elevated creatinine at 1.71. Stable.  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 Ophthalmologist recommended laser surgery, but she prefers eye drops. - Continue current eye drop regimen with one drop at night. - Follow up with ophthalmologist on Monday. - Discuss potential laser surgery with ophthalmologist if eye pressure does not improve.  Hypertension Reported elevated blood pressure, recently improved. Stress may have contributed. - Monitor blood pressure regularly.  Diarrhea Diarrhea improved, stabilizing magnesium levels at 1.9. - Monitor for recurrence of diarrhea. - Monitor magnesium levels.  Follow-up Multiple follow-up appointments scheduled with specialists. - Schedule follow-up appointments as needed. - Coordinate with specialists for ongoing care.  All questions were answered. The patient knows to call the clinic with any problems, questions or concerns. We can certainly see the patient much sooner if necessary.  Total encounter time:30 minutes*in face-to-face visit time, chart review, lab review, care coordination, order entry, and documentation of the encounter time.   *Total Encounter Time as defined by the Centers for Medicare and Medicaid Services includes, in addition to the face-to-face time of a patient visit (  documented in the note above) non-face-to-face time: obtaining and reviewing outside history, ordering and reviewing medications, tests or procedures, care coordination (communications with other health care professionals or caregivers) and documentation in the  medical record.

## 2023-04-29 ENCOUNTER — Encounter: Payer: Self-pay | Admitting: Oncology

## 2023-04-29 ENCOUNTER — Other Ambulatory Visit: Payer: Self-pay | Admitting: Pharmacy Technician

## 2023-04-29 ENCOUNTER — Other Ambulatory Visit: Payer: Self-pay

## 2023-04-29 DIAGNOSIS — I130 Hypertensive heart and chronic kidney disease with heart failure and stage 1 through stage 4 chronic kidney disease, or unspecified chronic kidney disease: Secondary | ICD-10-CM | POA: Diagnosis not present

## 2023-04-29 DIAGNOSIS — I5030 Unspecified diastolic (congestive) heart failure: Secondary | ICD-10-CM | POA: Diagnosis not present

## 2023-04-29 DIAGNOSIS — J9621 Acute and chronic respiratory failure with hypoxia: Secondary | ICD-10-CM | POA: Diagnosis not present

## 2023-04-29 DIAGNOSIS — I4891 Unspecified atrial fibrillation: Secondary | ICD-10-CM | POA: Diagnosis not present

## 2023-04-29 DIAGNOSIS — N183 Chronic kidney disease, stage 3 unspecified: Secondary | ICD-10-CM | POA: Diagnosis not present

## 2023-04-29 DIAGNOSIS — J449 Chronic obstructive pulmonary disease, unspecified: Secondary | ICD-10-CM | POA: Diagnosis not present

## 2023-04-29 NOTE — Progress Notes (Signed)
 Specialty Pharmacy Refill Coordination Note  Stacey [name] is a 83 y.o. female contacted today regarding refills of specialty medication(s) Palbociclib I[facility]a Foil)   Patient requested (Patient-Rptd) Delivery   Delivery date: (Patient-Rptd) 05/07/23   Verified address: (Patient-Rptd) [address] [name]   Medication will be filled on 05/06/23.

## 2023-05-05 DIAGNOSIS — H1045 Other chronic allergic conjunctivitis: Secondary | ICD-10-CM | POA: Diagnosis not present

## 2023-05-05 DIAGNOSIS — H40042 Steroid responder, left eye: Secondary | ICD-10-CM | POA: Diagnosis not present

## 2023-05-05 DIAGNOSIS — H40013 Open angle with borderline findings, low risk, bilateral: Secondary | ICD-10-CM | POA: Diagnosis not present

## 2023-05-06 DIAGNOSIS — I4891 Unspecified atrial fibrillation: Secondary | ICD-10-CM | POA: Diagnosis not present

## 2023-05-06 DIAGNOSIS — N183 Chronic kidney disease, stage 3 unspecified: Secondary | ICD-10-CM | POA: Diagnosis not present

## 2023-05-06 DIAGNOSIS — J9621 Acute and chronic respiratory failure with hypoxia: Secondary | ICD-10-CM | POA: Diagnosis not present

## 2023-05-06 DIAGNOSIS — J449 Chronic obstructive pulmonary disease, unspecified: Secondary | ICD-10-CM | POA: Diagnosis not present

## 2023-05-06 DIAGNOSIS — I5030 Unspecified diastolic (congestive) heart failure: Secondary | ICD-10-CM | POA: Diagnosis not present

## 2023-05-06 DIAGNOSIS — I130 Hypertensive heart and chronic kidney disease with heart failure and stage 1 through stage 4 chronic kidney disease, or unspecified chronic kidney disease: Secondary | ICD-10-CM | POA: Diagnosis not present

## 2023-05-13 DIAGNOSIS — N183 Chronic kidney disease, stage 3 unspecified: Secondary | ICD-10-CM | POA: Diagnosis not present

## 2023-05-13 DIAGNOSIS — J449 Chronic obstructive pulmonary disease, unspecified: Secondary | ICD-10-CM | POA: Diagnosis not present

## 2023-05-13 DIAGNOSIS — I4891 Unspecified atrial fibrillation: Secondary | ICD-10-CM | POA: Diagnosis not present

## 2023-05-13 DIAGNOSIS — I130 Hypertensive heart and chronic kidney disease with heart failure and stage 1 through stage 4 chronic kidney disease, or unspecified chronic kidney disease: Secondary | ICD-10-CM | POA: Diagnosis not present

## 2023-05-13 DIAGNOSIS — I5030 Unspecified diastolic (congestive) heart failure: Secondary | ICD-10-CM | POA: Diagnosis not present

## 2023-05-13 DIAGNOSIS — J9621 Acute and chronic respiratory failure with hypoxia: Secondary | ICD-10-CM | POA: Diagnosis not present

## 2023-05-14 ENCOUNTER — Telehealth (INDEPENDENT_AMBULATORY_CARE_PROVIDER_SITE_OTHER): Payer: Self-pay | Admitting: Otolaryngology

## 2023-05-14 NOTE — Telephone Encounter (Signed)
 Pt had a CT done on 04-22-23 and she still has not been given the results, in pts chart it says its not released, also the pt stated she was a cancer pt and does not understand why it is taking so long. Please advise

## 2023-05-17 ENCOUNTER — Telehealth (INDEPENDENT_AMBULATORY_CARE_PROVIDER_SITE_OTHER): Payer: Self-pay

## 2023-05-17 NOTE — Telephone Encounter (Signed)
 Left a message for the patient with a  normal scan result

## 2023-05-17 NOTE — Telephone Encounter (Signed)
-----   Message from [place] sent at 05/17/2023  9:32 AM EDT ----- No evidence of chronic sinusitis on CT scan - could you pls let her know thnx ----- Message ----- From: Interface, Rad Results In Sent: 05/17/2023   7:32 AM EDT To: Artice Last, MD

## 2023-05-21 DIAGNOSIS — I4891 Unspecified atrial fibrillation: Secondary | ICD-10-CM | POA: Diagnosis not present

## 2023-05-21 DIAGNOSIS — J449 Chronic obstructive pulmonary disease, unspecified: Secondary | ICD-10-CM | POA: Diagnosis not present

## 2023-05-21 DIAGNOSIS — I130 Hypertensive heart and chronic kidney disease with heart failure and stage 1 through stage 4 chronic kidney disease, or unspecified chronic kidney disease: Secondary | ICD-10-CM | POA: Diagnosis not present

## 2023-05-21 DIAGNOSIS — I5030 Unspecified diastolic (congestive) heart failure: Secondary | ICD-10-CM | POA: Diagnosis not present

## 2023-05-21 DIAGNOSIS — J9621 Acute and chronic respiratory failure with hypoxia: Secondary | ICD-10-CM | POA: Diagnosis not present

## 2023-05-21 DIAGNOSIS — N183 Chronic kidney disease, stage 3 unspecified: Secondary | ICD-10-CM | POA: Diagnosis not present

## 2023-05-26 ENCOUNTER — Inpatient Hospital Stay

## 2023-05-26 ENCOUNTER — Inpatient Hospital Stay: Attending: Adult Health

## 2023-05-26 VITALS — BP 120/56 | HR 93 | Resp 17

## 2023-05-26 DIAGNOSIS — D630 Anemia in neoplastic disease: Secondary | ICD-10-CM

## 2023-05-26 DIAGNOSIS — C7951 Secondary malignant neoplasm of bone: Secondary | ICD-10-CM

## 2023-05-26 DIAGNOSIS — Z79818 Long term (current) use of other agents affecting estrogen receptors and estrogen levels: Secondary | ICD-10-CM | POA: Insufficient documentation

## 2023-05-26 DIAGNOSIS — Z170 Estrogen receptor positive status [ER+]: Secondary | ICD-10-CM

## 2023-05-26 DIAGNOSIS — C50211 Malignant neoplasm of upper-inner quadrant of right female breast: Secondary | ICD-10-CM | POA: Diagnosis not present

## 2023-05-26 LAB — CBC WITH DIFFERENTIAL (CANCER CENTER ONLY)
Abs Immature Granulocytes: 0.02 10*3/uL (ref 0.00–0.07)
Basophils Absolute: 0 10*3/uL (ref 0.0–0.1)
Basophils Relative: 1 %
Eosinophils Absolute: 0.1 10*3/uL (ref 0.0–0.5)
Eosinophils Relative: 1 %
HCT: 29.2 % — ABNORMAL LOW (ref 36.0–46.0)
Hemoglobin: 9.9 g/dL — ABNORMAL LOW (ref 12.0–15.0)
Immature Granulocytes: 1 %
Lymphocytes Relative: 17 %
Lymphs Abs: 0.7 10*3/uL (ref 0.7–4.0)
MCH: 32.9 pg (ref 26.0–34.0)
MCHC: 33.9 g/dL (ref 30.0–36.0)
MCV: 97 fL (ref 80.0–100.0)
Monocytes Absolute: 0.2 10*3/uL (ref 0.1–1.0)
Monocytes Relative: 6 %
Neutro Abs: 2.9 10*3/uL (ref 1.7–7.7)
Neutrophils Relative %: 74 %
Platelet Count: 174 10*3/uL (ref 150–400)
RBC: 3.01 MIL/uL — ABNORMAL LOW (ref 3.87–5.11)
RDW: 15.9 % — ABNORMAL HIGH (ref 11.5–15.5)
WBC Count: 3.9 10*3/uL — ABNORMAL LOW (ref 4.0–10.5)
nRBC: 0 % (ref 0.0–0.2)

## 2023-05-26 LAB — CMP (CANCER CENTER ONLY)
ALT: 10 U/L (ref 0–44)
AST: 19 U/L (ref 15–41)
Albumin: 4.5 g/dL (ref 3.5–5.0)
Alkaline Phosphatase: 55 U/L (ref 38–126)
Anion gap: 13 (ref 5–15)
BUN: 29 mg/dL — ABNORMAL HIGH (ref 8–23)
CO2: 20 mmol/L — ABNORMAL LOW (ref 22–32)
Calcium: 9.6 mg/dL (ref 8.9–10.3)
Chloride: 108 mmol/L (ref 98–111)
Creatinine: 1.75 mg/dL — ABNORMAL HIGH (ref 0.44–1.00)
GFR, Estimated: 29 mL/min — ABNORMAL LOW (ref >60)
Glucose, Bld: 133 mg/dL — ABNORMAL HIGH (ref 70–99)
Potassium: 3.9 mmol/L (ref 3.5–5.1)
Sodium: 141 mmol/L (ref 135–145)
Total Bilirubin: 0.4 mg/dL (ref 0.0–1.2)
Total Protein: 7.9 g/dL (ref 6.5–8.1)

## 2023-05-26 LAB — MAGNESIUM: Magnesium: 1.6 mg/dL — ABNORMAL LOW (ref 1.7–2.4)

## 2023-05-26 MED ORDER — FULVESTRANT 250 MG/5ML IM SOSY
500.0000 mg | PREFILLED_SYRINGE | Freq: Once | INTRAMUSCULAR | Status: AC
  Administered 2023-05-26: 500 mg via INTRAMUSCULAR
  Filled 2023-05-26: qty 10

## 2023-05-28 ENCOUNTER — Other Ambulatory Visit: Payer: Self-pay | Admitting: Hematology and Oncology

## 2023-06-02 ENCOUNTER — Telehealth: Payer: Self-pay

## 2023-06-02 NOTE — Telephone Encounter (Signed)
 Pt called back to discuss mag levels and MD recommendation for IV mag if she is still having diarrhea. She reports that she is having about 2 loose stools a day but it is not watery and as bad as it has been the last couple of years. She reports she is taking her PO mag and she doesn't feel the need to come in for IV mag at this time. Pt was educated on benefit of IV mag and she declined at this time

## 2023-06-02 NOTE — Telephone Encounter (Signed)
 Attempted to call pt to discuss results and if she is having symptoms. LVM for call back.

## 2023-06-03 ENCOUNTER — Other Ambulatory Visit: Payer: Self-pay

## 2023-06-03 ENCOUNTER — Other Ambulatory Visit: Payer: Self-pay | Admitting: Hematology and Oncology

## 2023-06-03 NOTE — Progress Notes (Signed)
 Specialty Pharmacy Refill Coordination Note  KIELYNN GOIN is a 83 y.o. female contacted today regarding refills of specialty medication(s) Palbociclib  ([facility] )   Patient requested (Patient-Rptd) Delivery   Delivery date: (Patient-Rptd) 06/07/23   Verified address: (Patient-Rptd) [address], [place], [place]   Medication will be filled on 06/04/23, pending refill approval.

## 2023-06-04 ENCOUNTER — Other Ambulatory Visit: Payer: Self-pay

## 2023-06-04 MED ORDER — PALBOCICLIB 75 MG PO TABS
75.0000 mg | ORAL_TABLET | Freq: Every day | ORAL | 3 refills | Status: DC
  Filled 2023-06-04: qty 21, 28d supply, fill #0
  Filled 2023-07-09: qty 21, 28d supply, fill #1
  Filled 2023-08-25: qty 21, 28d supply, fill #2
  Filled 2023-09-13: qty 21, 28d supply, fill #3

## 2023-06-10 ENCOUNTER — Institutional Professional Consult (permissible substitution): Admitting: Pulmonary Disease

## 2023-06-10 ENCOUNTER — Encounter: Payer: Self-pay | Admitting: Oncology

## 2023-06-13 NOTE — Progress Notes (Unsigned)
 Subjective:     Patient ID: Stacey [name], female   DOB: 09-Feb-1940     MRN: [id number]    Brief patient profile:  16   yowf stopped smoking 01/2016  S/p RT to both breasts last RT Oct 2012 and seemed to affect breathing/ cough and then worse summer 2017 and quit smoking the cough resolved but doe stayed same and so pfts done c/w GOLD I copd 06/22/16  So pt referred to pulmonary clinic by Dr   Autry Legions and first seen 08/27/2016    History of Present Illness  08/27/2016 1st [facility] office visit/ Juriel Cid   Chief Complaint  Patient presents with   Pulmonary Consult    Referred by Dr. Rosalia Colonel for eval of abnormal PFT. Pt c/o SOB for the past year, progressively getting worse. She states she tires very easily and her exercise tolerance has gone downhill.  She has rx for albuterol  inhaler, but never had it filled.   indolent onset doe x 2012 worse since summer 2017 but since then about the same  Doe MMRC1 = can walk nl pace, flat grade, can't hurry or go uphills or steps s sob   Was walking 18 min / mile prior to 23 min mile now  Taking prilosec at least one daily right before bfast for overt HB Was prescribed inhalers but never filled rx or tried one of any kind rec Spiriva  2 puffs each am x 2 week sample and if your ex tolerance is better, stay on it indefinitely  Work on inhaler technique:   You have mild copd (GOLD I)    12/01/2021  yearly  f/u ov/Mylinda Brook re: GOLD 1   maint on spiriva   not sure she can tell it's helping  No chief complaint on file. Dyspnea:  avg pace and problems hills =  MMRC1 = can walk nl pace, flat grade, can't hurry or go uphills or steps s sob   Cough: none  Sleeping: prone/ flat bed  SABA use: none  02: none  Rec Try stop spiriva  and start stiolto 2 puffs each am x 2 weeks to see if it improves your exercise tolerance better than spiriva  and if not ok to stop it Work on inhaler technique:     06/16/2023  f/u ov/Solita Macadam re: GOLD 1 copd  maint on no rx   with met breast ca  on Ibrance   Chief Complaint  Patient presents with   Follow-up    COPD, Pulmonary nodule, and Respiratory failure f/u   Dyspnea:  worse  doe 0ct 2024 admit with pna  HT ok pushing cart avg speed / 15 min/mile flat x 30 min  Cough: coughing more since onset of doe > minimal mucoid Sleeping: flat bed, one flat pilow  resp cc  SABA use: none 02: none      No obvious day to day or daytime variability or assoc excess/ purulent sputum or mucus plugs or hemoptysis or cp or chest tightness, subjective wheeze or overt sinus or hb symptoms.    Also denies any obvious fluctuation of symptoms with weather or environmental changes or other aggravating or alleviating factors except as outlined above   No unusual exposure hx or h/o childhood pna/ asthma or knowledge of premature birth.  Current Allergies, Complete Past Medical History, Past Surgical History, Family History, and Social History were reviewed in [employer] record.  ROS  The following are not active complaints unless bolded Hoarseness, sore  throat, dysphagia, dental problems, itching, sneezing,  nasal congestion or discharge of excess mucus or purulent secretions, ear ache,   fever, chills, sweats, unintended wt loss or wt gain, classically pleuritic or exertional cp,  orthopnea pnd or arm/hand swelling  or leg swelling, presyncope, palpitations, abdominal pain, anorexia, nausea, vomiting, diarrhea  or change in bowel habits or change in bladder habits, change in stools or change in urine, dysuria, hematuria,  rash, arthralgias, visual complaints, headache, numbness, weakness or ataxia or problems with walking or coordination,  change in mood or  memory.        Current Meds  Medication Sig   acetaminophen  (TYLENOL ) 500 MG tablet Take 1,000 mg by mouth every 8 (eight) hours as needed for mild pain (pain score 1-3) or moderate pain (pain score 4-6).   anastrozole  (ARIMIDEX ) 1 MG tablet TAKE 1  TABLET(1 MG) BY MOUTH DAILY   bimatoprost (LUMIGAN) 0.01 % SOLN 1 drop at bedtime.   CALCIUM  CARBONATE-VITAMIN D  PO Take 1 tablet by mouth 2 (two) times daily.   cholecalciferol (VITAMIN D3) 25 MCG (1000 UNIT) tablet Take 1,000 Units by mouth daily.   ELIQUIS  5 MG TABS tablet TAKE 1 TABLET(5 MG) BY MOUTH TWICE DAILY   loperamide  (IMODIUM  A-D) 2 MG tablet Take 2 mg by mouth 4 (four) times daily as needed for diarrhea or loose stools.   loratadine  (CLARITIN ) 10 MG tablet Take 10 mg by mouth every morning.   Multiple Vitamins-Minerals (MULTIVITAMIN WITH MINERALS) tablet Take 1 tablet by mouth in the morning.   omeprazole  (PRILOSEC) 40 MG capsule Take 1 capsule (40 mg total) by mouth daily.   palbociclib  (IBRANCE ) 75 MG tablet Take 1 tablet (75 mg total) by mouth daily. Take for 21 days on, 7 days off, repeat every 28 days.   vitamin B-12 (CYANOCOBALAMIN ) 100 MCG tablet Take 100 mcg by mouth daily.   zolpidem  (AMBIEN ) 10 MG tablet Take 10 mg by mouth at bedtime as needed for sleep.                      Objective:   Physical Exam   wts    06/16/2023         141  12/01/2021      142  11/25/2020      146   11/27/2019      163  12/02/2018      170 12/01/2017      171   08/27/16 157 lb (71.2 kg)  05/14/16 163 lb (73.9 kg)  01/14/16 160 lb (72.6 kg)    Vital signs reviewed  06/16/2023  - Note at rest 02 sats  95% on RA   General appearance:    pleasant amb wf  nad      Min barr***            Assessment:

## 2023-06-16 ENCOUNTER — Ambulatory Visit (INDEPENDENT_AMBULATORY_CARE_PROVIDER_SITE_OTHER): Admitting: Internal Medicine

## 2023-06-16 ENCOUNTER — Ambulatory Visit (INDEPENDENT_AMBULATORY_CARE_PROVIDER_SITE_OTHER)

## 2023-06-16 ENCOUNTER — Encounter: Payer: Self-pay | Admitting: Internal Medicine

## 2023-06-16 VITALS — BP 149/81 | HR 98 | Temp 97.8°F | Ht 63.0 in | Wt 141.6 lb

## 2023-06-16 DIAGNOSIS — R059 Cough, unspecified: Secondary | ICD-10-CM

## 2023-06-16 DIAGNOSIS — R918 Other nonspecific abnormal finding of lung field: Secondary | ICD-10-CM | POA: Diagnosis not present

## 2023-06-16 DIAGNOSIS — J449 Chronic obstructive pulmonary disease, unspecified: Secondary | ICD-10-CM

## 2023-06-16 DIAGNOSIS — R0609 Other forms of dyspnea: Secondary | ICD-10-CM

## 2023-06-16 NOTE — Patient Instructions (Addendum)
 Please remember to go to the  x-ray department  for your tests - we will call you with the results when they are available    Pace yourself   Make sure you check your oxygen saturation at your highest level of activity(NOT after you stop)  to be sure it stays over 90% and keep track of it at least once a week, more often if breathing getting worse, and let me know if losing ground. (Collect the dots to connect the dots approach)     Please schedule a follow up visit in 3 months but call sooner if needed   Add abn cxr  ? Rml dz but no clinical pna >

## 2023-06-17 ENCOUNTER — Other Ambulatory Visit: Payer: Self-pay | Admitting: Internal Medicine

## 2023-06-17 ENCOUNTER — Ambulatory Visit: Payer: Self-pay | Admitting: Internal Medicine

## 2023-06-17 DIAGNOSIS — R059 Cough, unspecified: Secondary | ICD-10-CM

## 2023-06-17 DIAGNOSIS — R0609 Other forms of dyspnea: Secondary | ICD-10-CM

## 2023-06-17 MED ORDER — AZITHROMYCIN 250 MG PO TABS: ORAL_TABLET | ORAL | 0 refills | Status: AC

## 2023-06-17 NOTE — Assessment & Plan Note (Addendum)
 Quit smoking 01/2016 - PFT's  08/27/2016  FEV1 1.83 (86 % ) ratio 63  p 2 % improvement from saba p nothing prior to study with DLCO  43/45c % corrects to 60  % for alv volume  - 08/27/2016   Walked RA  2 laps @ 185 ft each stopped due to legs gave out walking fast pace, no sob or desat  - 08/27/2016  After extensive coaching device  effectiveness =    75% > try spiriva  respimat 2.5 mg x 2 pffs each am - 12/02/2018  C/o dry mouth so rec taper off spiriva  to see if affects doe or mouth and if can tolerate one puff spiriva   (but not zero due to doe)  then options are one puff spiriva  or one stiolto daily - 11/27/2019  After extensive coaching inhaler device,  effectiveness =    90% with elipta so try anoro one click each am due to mmrc2 doe and cost of spiriva   > preferred spiriva  respimat - 12/01/2021  After extensive coaching inhaler device,  effectiveness =    90% with smi > try stiolto 2 each am and if no change in ex tol  then d/c and see what absence of rx does to above indicators  -06/16/2023   Walked on RA  x  2  lap(s) =  approx 500  ft  @ slow pace, stopped due to sob  with lowest 02 sats 91%   - 06/16/2023 ? RML as dz > zpak and then return for cxr/ esr  - 06/16/2023 ? RML as dz >  rec doe labs plus ESR and then zpak and f/u one m with cxr   Comment: Symptoms are   disproportionate to objective findings and not clear to what extent this is actually a pulmonary  problem but pt does appear to have difficult to sort out respiratory symptoms of unknown origin for which  DDX  = almost all start with A and  include Adherence, Ace Inhibitors, Acid Reflux, Active Sinus Disease, Alpha 1 Antitripsin deficiency, Anxiety masquerading as Airways dz,  ABPA,  Allergy(esp in young), Aspiration (esp in elderly), Adverse effects of meds,  Active smoking or Vaping, A bunch of PE's/clot burden (a few small clots can't cause this syndrome unless there is already severe underlying pulm or vascular dz with poor reserve),   Anemia or thyroid  disorder, plus two Bs  = Bronchiectasis and Beta blocker use..and one C= CHF   Will check approp doe labs/ rx with zpak in case of atypical pna syndrome and f/u in one m  Each maintenance medication was reviewed in detail including emphasizing most importantly the difference between maintenance and prns and under what circumstances the prns are to be triggered using an action plan format where appropriate.  Total time for H and P, chart review, counseling,  directly observing portions of ambulatory 02 saturation study/ and generating customized AVS unique to this office visit / same day charting = 32 min

## 2023-06-17 NOTE — Progress Notes (Signed)
 Spoke with pt and notified of results per Dr. Waymond Hailey. Pt verbalized understanding and denied any questions. Labs ordered and the zpack sent to pharm

## 2023-06-17 NOTE — Progress Notes (Signed)
 Called the pt and there was no answer- LMTCB

## 2023-06-18 LAB — CBC WITH DIFFERENTIAL/PLATELET
Basophils Absolute: 0 10*3/uL (ref 0.0–0.1)
Basophils Relative: 1.1 % (ref 0.0–3.0)
Eosinophils Absolute: 0.1 10*3/uL (ref 0.0–0.7)
Eosinophils Relative: 1.7 % (ref 0.0–5.0)
HCT: 29.6 % — ABNORMAL LOW (ref 36.0–46.0)
Hemoglobin: 10 g/dL — ABNORMAL LOW (ref 12.0–15.0)
Lymphocytes Relative: 16.7 % (ref 12.0–46.0)
Lymphs Abs: 0.7 10*3/uL (ref 0.7–4.0)
MCHC: 33.9 g/dL (ref 30.0–36.0)
MCV: 101.5 fl — ABNORMAL HIGH (ref 78.0–100.0)
Monocytes Absolute: 0.3 10*3/uL (ref 0.1–1.0)
Monocytes Relative: 7.8 % (ref 3.0–12.0)
Neutro Abs: 2.9 10*3/uL (ref 1.4–7.7)
Neutrophils Relative %: 72.7 % (ref 43.0–77.0)
Platelets: 342 10*3/uL (ref 150.0–400.0)
RBC: 2.92 Mil/uL — ABNORMAL LOW (ref 3.87–5.11)
RDW: 14.8 % (ref 11.5–15.5)
WBC: 3.9 10*3/uL — ABNORMAL LOW (ref 4.0–10.5)

## 2023-06-18 LAB — SEDIMENTATION RATE: Sed Rate: 35 mm/h — ABNORMAL HIGH (ref 0–30)

## 2023-06-21 LAB — BRAIN NATRIURETIC PEPTIDE: Pro B Natriuretic peptide (BNP): 323 pg/mL — ABNORMAL HIGH (ref 0.0–100.0)

## 2023-06-22 ENCOUNTER — Ambulatory Visit: Payer: Self-pay | Admitting: Internal Medicine

## 2023-06-23 ENCOUNTER — Inpatient Hospital Stay

## 2023-06-23 ENCOUNTER — Inpatient Hospital Stay: Attending: Adult Health

## 2023-06-23 VITALS — BP 126/64 | HR 86 | Temp 98.2°F | Resp 16

## 2023-06-23 DIAGNOSIS — C7951 Secondary malignant neoplasm of bone: Secondary | ICD-10-CM

## 2023-06-23 DIAGNOSIS — Z79818 Long term (current) use of other agents affecting estrogen receptors and estrogen levels: Secondary | ICD-10-CM | POA: Insufficient documentation

## 2023-06-23 DIAGNOSIS — C50212 Malignant neoplasm of upper-inner quadrant of left female breast: Secondary | ICD-10-CM

## 2023-06-23 DIAGNOSIS — Z170 Estrogen receptor positive status [ER+]: Secondary | ICD-10-CM | POA: Diagnosis not present

## 2023-06-23 DIAGNOSIS — C50211 Malignant neoplasm of upper-inner quadrant of right female breast: Secondary | ICD-10-CM | POA: Diagnosis not present

## 2023-06-23 LAB — CBC WITH DIFFERENTIAL/PLATELET
Abs Immature Granulocytes: 0.01 10*3/uL (ref 0.00–0.07)
Basophils Absolute: 0 10*3/uL (ref 0.0–0.1)
Basophils Relative: 1 %
Eosinophils Absolute: 0.1 10*3/uL (ref 0.0–0.5)
Eosinophils Relative: 1 %
HCT: 29.5 % — ABNORMAL LOW (ref 36.0–46.0)
Hemoglobin: 10 g/dL — ABNORMAL LOW (ref 12.0–15.0)
Immature Granulocytes: 0 %
Lymphocytes Relative: 21 %
Lymphs Abs: 0.8 10*3/uL (ref 0.7–4.0)
MCH: 34.4 pg — ABNORMAL HIGH (ref 26.0–34.0)
MCHC: 33.9 g/dL (ref 30.0–36.0)
MCV: 101.4 fL — ABNORMAL HIGH (ref 80.0–100.0)
Monocytes Absolute: 0.3 10*3/uL (ref 0.1–1.0)
Monocytes Relative: 8 %
Neutro Abs: 2.5 10*3/uL (ref 1.7–7.7)
Neutrophils Relative %: 69 %
Platelets: 172 10*3/uL (ref 150–400)
RBC: 2.91 MIL/uL — ABNORMAL LOW (ref 3.87–5.11)
RDW: 14.6 % (ref 11.5–15.5)
WBC: 3.6 10*3/uL — ABNORMAL LOW (ref 4.0–10.5)
nRBC: 0 % (ref 0.0–0.2)

## 2023-06-23 MED ORDER — FULVESTRANT 250 MG/5ML IM SOSY
500.0000 mg | PREFILLED_SYRINGE | Freq: Once | INTRAMUSCULAR | Status: AC
  Administered 2023-06-23: 500 mg via INTRAMUSCULAR
  Filled 2023-06-23: qty 10

## 2023-07-09 ENCOUNTER — Other Ambulatory Visit: Payer: Self-pay

## 2023-07-09 NOTE — Progress Notes (Signed)
 Specialty Pharmacy Refill Coordination Note  Stacey [name] is a 83 y.o. female contacted today regarding refills of specialty medication(s) Palbociclib  (IBRANCE )   Patient requested Delivery   Delivery date: 07/28/23   Verified address: [address] [place], [place]   Medication will be filled on 06.24.25.

## 2023-07-15 ENCOUNTER — Other Ambulatory Visit: Payer: Self-pay | Admitting: Internal Medicine

## 2023-07-19 ENCOUNTER — Other Ambulatory Visit: Payer: Self-pay | Admitting: *Deleted

## 2023-07-19 DIAGNOSIS — C7951 Secondary malignant neoplasm of bone: Secondary | ICD-10-CM

## 2023-07-19 DIAGNOSIS — Z170 Estrogen receptor positive status [ER+]: Secondary | ICD-10-CM

## 2023-07-21 NOTE — Telephone Encounter (Signed)
 Copied from CRM [id number]. Topic: Clinical - Medication Question >> Jul 21, 2023  1:09 PM Adonis Hoot wrote: Reason for CRM:  [facility] pharmacy called in to check on status of zolpidem  (AMBIEN ) 10 MG tablet medication prescription being sent to pharmacy for patient. Stated that request was sent over a few days ago and they still haven't heard back.

## 2023-07-22 ENCOUNTER — Inpatient Hospital Stay: Attending: Adult Health

## 2023-07-22 ENCOUNTER — Encounter: Payer: Self-pay | Admitting: Adult Health

## 2023-07-22 ENCOUNTER — Inpatient Hospital Stay

## 2023-07-22 ENCOUNTER — Inpatient Hospital Stay (HOSPITAL_BASED_OUTPATIENT_CLINIC_OR_DEPARTMENT_OTHER): Admitting: Adult Health

## 2023-07-22 VITALS — BP 127/51 | HR 95 | Resp 18 | Ht 60.3 in | Wt 139.5 lb

## 2023-07-22 DIAGNOSIS — E785 Hyperlipidemia, unspecified: Secondary | ICD-10-CM | POA: Insufficient documentation

## 2023-07-22 DIAGNOSIS — C7951 Secondary malignant neoplasm of bone: Secondary | ICD-10-CM

## 2023-07-22 DIAGNOSIS — Z803 Family history of malignant neoplasm of breast: Secondary | ICD-10-CM | POA: Diagnosis not present

## 2023-07-22 DIAGNOSIS — J440 Chronic obstructive pulmonary disease with acute lower respiratory infection: Secondary | ICD-10-CM | POA: Insufficient documentation

## 2023-07-22 DIAGNOSIS — Z170 Estrogen receptor positive status [ER+]: Secondary | ICD-10-CM | POA: Insufficient documentation

## 2023-07-22 DIAGNOSIS — M8580 Other specified disorders of bone density and structure, unspecified site: Secondary | ICD-10-CM | POA: Diagnosis not present

## 2023-07-22 DIAGNOSIS — I130 Hypertensive heart and chronic kidney disease with heart failure and stage 1 through stage 4 chronic kidney disease, or unspecified chronic kidney disease: Secondary | ICD-10-CM | POA: Insufficient documentation

## 2023-07-22 DIAGNOSIS — Z86718 Personal history of other venous thrombosis and embolism: Secondary | ICD-10-CM | POA: Diagnosis not present

## 2023-07-22 DIAGNOSIS — C50212 Malignant neoplasm of upper-inner quadrant of left female breast: Secondary | ICD-10-CM

## 2023-07-22 DIAGNOSIS — K5900 Constipation, unspecified: Secondary | ICD-10-CM | POA: Diagnosis not present

## 2023-07-22 DIAGNOSIS — Z79811 Long term (current) use of aromatase inhibitors: Secondary | ICD-10-CM | POA: Diagnosis not present

## 2023-07-22 DIAGNOSIS — Z87891 Personal history of nicotine dependence: Secondary | ICD-10-CM | POA: Diagnosis not present

## 2023-07-22 DIAGNOSIS — Z923 Personal history of irradiation: Secondary | ICD-10-CM | POA: Insufficient documentation

## 2023-07-22 DIAGNOSIS — K529 Noninfective gastroenteritis and colitis, unspecified: Secondary | ICD-10-CM | POA: Insufficient documentation

## 2023-07-22 DIAGNOSIS — J189 Pneumonia, unspecified organism: Secondary | ICD-10-CM | POA: Diagnosis not present

## 2023-07-22 DIAGNOSIS — I5032 Chronic diastolic (congestive) heart failure: Secondary | ICD-10-CM | POA: Diagnosis not present

## 2023-07-22 DIAGNOSIS — Z801 Family history of malignant neoplasm of trachea, bronchus and lung: Secondary | ICD-10-CM | POA: Insufficient documentation

## 2023-07-22 LAB — CBC WITH DIFFERENTIAL (CANCER CENTER ONLY)
Abs Immature Granulocytes: 0.01 10*3/uL (ref 0.00–0.07)
Basophils Absolute: 0 10*3/uL (ref 0.0–0.1)
Basophils Relative: 1 %
Eosinophils Absolute: 0.1 10*3/uL (ref 0.0–0.5)
Eosinophils Relative: 2 %
HCT: 28.9 % — ABNORMAL LOW (ref 36.0–46.0)
Hemoglobin: 9.8 g/dL — ABNORMAL LOW (ref 12.0–15.0)
Immature Granulocytes: 0 %
Lymphocytes Relative: 25 %
Lymphs Abs: 0.7 10*3/uL (ref 0.7–4.0)
MCH: 33.9 pg (ref 26.0–34.0)
MCHC: 33.9 g/dL (ref 30.0–36.0)
MCV: 100 fL (ref 80.0–100.0)
Monocytes Absolute: 0.2 10*3/uL (ref 0.1–1.0)
Monocytes Relative: 8 %
Neutro Abs: 1.9 10*3/uL (ref 1.7–7.7)
Neutrophils Relative %: 64 %
Platelet Count: 150 10*3/uL (ref 150–400)
RBC: 2.89 MIL/uL — ABNORMAL LOW (ref 3.87–5.11)
RDW: 14 % (ref 11.5–15.5)
Smear Review: NORMAL
WBC Count: 2.9 10*3/uL — ABNORMAL LOW (ref 4.0–10.5)
nRBC: 0 % (ref 0.0–0.2)

## 2023-07-22 LAB — CMP (CANCER CENTER ONLY)
ALT: 9 U/L (ref 0–44)
AST: 19 U/L (ref 15–41)
Albumin: 4.4 g/dL (ref 3.5–5.0)
Alkaline Phosphatase: 54 U/L (ref 38–126)
Anion gap: 11 (ref 5–15)
BUN: 32 mg/dL — ABNORMAL HIGH (ref 8–23)
CO2: 19 mmol/L — ABNORMAL LOW (ref 22–32)
Calcium: 9.3 mg/dL (ref 8.9–10.3)
Chloride: 110 mmol/L (ref 98–111)
Creatinine: 1.63 mg/dL — ABNORMAL HIGH (ref 0.44–1.00)
GFR, Estimated: 31 mL/min — ABNORMAL LOW (ref >60)
Glucose, Bld: 119 mg/dL — ABNORMAL HIGH (ref 70–99)
Potassium: 3.8 mmol/L (ref 3.5–5.1)
Sodium: 140 mmol/L (ref 135–145)
Total Bilirubin: 0.4 mg/dL (ref 0.0–1.2)
Total Protein: 7.7 g/dL (ref 6.5–8.1)

## 2023-07-22 LAB — MAGNESIUM: Magnesium: 1.6 mg/dL — ABNORMAL LOW (ref 1.7–2.4)

## 2023-07-22 MED ORDER — FULVESTRANT 250 MG/5ML IM SOSY
500.0000 mg | PREFILLED_SYRINGE | Freq: Once | INTRAMUSCULAR | Status: AC
  Administered 2023-07-22: 500 mg via INTRAMUSCULAR
  Filled 2023-07-22: qty 10

## 2023-07-22 NOTE — Progress Notes (Unsigned)
 [facility] Cancer Follow up:    Stacey Coombe, MD [address] [place] [address]   DIAGNOSIS: Cancer Staging  Malignant neoplasm of upper-inner quadrant of left breast in female, estrogen receptor positive (HCC) Staging form: Breast, AJCC 7th Edition - Clinical: Stage IIB (T2, N1, M0) - Signed by Willy Harvest, MD on 12/19/2014    SUMMARY OF ONCOLOGIC HISTORY: Oncology History  Malignant neoplasm of upper-inner quadrant of left breast in female, estrogen receptor positive (HCC)  11/1995 Cancer Diagnosis   History of IDC of left breast, grade 2, ER/PR+, 1/15 LN positive for malignancy, s/p doxorubicin and cyclophosphamide x 4 cycles followed by adjuvant RT to left breast and adjuvant tamoxifen  x 7 years Stacey [name])   11/14/2010 Initial Biopsy   Right breast needle core bx: IMC/MCIS, grade 2 , ER/PR+, HER2/neu negative   12/2010 -  Neo-Adjuvant Anti-estrogen oral therapy   letrozole  2.5 mg daily; changed to tamoxifen  20 mg daily Feb, 2013 due to osteopenia   09/03/2011 Pathologic Stage   Stage IIB: ypT2 ypN1a   09/03/2011 Definitive Surgery   Right lumpectomy/SLNB: ILC with calcifications, grade 2, 1 sentinel LN positive for malignancy   10/19/2011 - 12/03/2011 Radiation Therapy   Adjuvant RT to right breast Stacey [name])   06/15/2019 Genetic Testing   Negative genetic testing. No pathogenic variants identified on the [facility] Common Hereditary Cancers Panel. The report date is 06/15/2019.  The Common Hereditary Cancers Panel offered by [facility] includes sequencing and/or deletion duplication testing of the following 48 genes: APC, ATM, AXIN2, BARD1, BMPR1A, BRCA1, BRCA2, BRIP1, CDH1, CDKN2A (p14ARF), CDKN2A (p16INK4a), CKD4, CHEK2, CTNNA1, DICER1, EPCAM (Deletion/duplication testing only), GREM1 (promoter region deletion/duplication testing only), KIT, MEN1, MLH1, MSH2, MSH3, MSH6, MUTYH, NBN, NF1, NHTL1, PALB2, PDGFRA, PMS2, POLD1, POLE, PTEN, RAD50, RAD51C,  RAD51D, RNF43, SDHB, SDHC, SDHD, SMAD4, SMARCA4. STK11, TP53, TSC1, TSC2, and VHL.  The following genes were evaluated for sequence changes only: SDHA and HOXB13 c.251G>A variant only.     CURRENT THERAPY:  INTERVAL HISTORY:  Discussed the use of AI scribe software for clinical note transcription with the patient, who gave verbal consent to proceed.  Stacey [name] 83 y.o. female returns for    Patient Active Problem List   Diagnosis Date Noted  . Diastolic CHF (HCC) 02/09/2023  . CKD (chronic kidney disease), stage III (HCC) 01/12/2023  . Orthostatic hypotension 01/12/2023  . AKI (acute kidney injury) (HCC) 01/01/2023  . AMS (altered mental status) 12/31/2022  . Altered mental status 12/30/2022  . Acute on chronic diastolic heart failure (HCC) 12/26/2022  . Hypokalemia 12/26/2022  . Acute on chronic hypoxic respiratory failure (HCC) 12/05/2022  . Metabolic acidosis 12/04/2022  . Hypocalcemia 12/04/2022  . Fatigue 12/04/2022  . Pneumonia 12/04/2022  . 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 DVT (deep venous thrombosis) (HCC) 12/04/2022  . Diarrhea 12/04/2022  . Sepsis (HCC) 11/17/2022  . History of DVT (deep vein thrombosis) 11/17/2022  . Current chronic use of systemic steroids 11/17/2022  . Tachypnea 11/17/2022  . Pulmonary nodule 11/17/2022  . Acute deep vein thrombosis (DVT) of femoral vein of right lower extremity (HCC) 04/13/2022  . Acute upper respiratory infection 05/16/2021  . Nasal dryness 02/04/2021  . Chronic sinusitis 11/25/2020  . Allergic rhinitis 07/08/2020  . Hypomagnesemia 06/10/2020  . Goals of care, counseling/discussion 06/21/2019  . Family history of breast cancer   . Family history of pancreatic cancer   . Family history of uterine cancer   .  Family history of lymphoma   . Family history of lung cancer   . 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HCC) 06/02/2019  . Recurrent cancer of left breast (HCC) 05/15/2019  . Pes  anserine bursitis 03/31/2019  . HTN (hypertension) 02/10/2019  . Hyperglycemia 06/21/2018  . Left carpal tunnel syndrome 02/22/2018  . Rotator cuff arthropathy of left shoulder 09/20/2017  . Arthritis of hand 08/23/2017  . Pain in right hand 08/23/2017  . Cervical radiculitis 04/09/2017  . Left shoulder pain 04/09/2017  . DOE (dyspnea on exertion) 05/14/2016  . 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 Lymphedema of left arm 01/14/2016  . Left arm swelling 12/25/2015  . SVT (supraventricular tachycardia) (HCC)   . Left-sided carotid artery disease (HCC) 03/08/2015  . Dizziness 01/24/2015  . Urinary frequency 01/13/2015  . Dizziness and giddiness 01/09/2015  . Gallstones 02/21/2013  . Malignant neoplasm of upper-inner quadrant of left breast in female, estrogen receptor positive (HCC) 11/28/2012  . Muscle cramping 09/12/2012  . Right hip pain 09/12/2012  . Lower back pain 09/12/2012  . COPD GOLD I    . Hx of radiation therapy   . Right shoulder pain 09/14/2011  . Preventative health care 07/29/2010  . Skin lesion of left leg 07/29/2010  . Paresthesia 07/29/2010  . GERD 03/28/2010  . Constipation 03/28/2010  . Osteoarthrosis, 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  . Anxiety state 05/03/2009  . Hyperlipidemia 06/14/2007  . FATIGUE 06/14/2007  . Anemia 12/17/2006  . Diverticulosis of colon 12/17/2006  . Cough 12/17/2006  . IRRITABLE BOWEL SYNDROME, HX OF 12/17/2006    is allergic to other, tape, aminoglycosides, bacitracin, bee venom, cephalexin , clindamycin/lincomycin, codeine, and nasacort  [triamcinolone ].  MEDICAL HISTORY: Past Medical History:  Diagnosis Date  . ANEMIA-NOS   . ANXIETY   . Breast cancer (HCC) 1997 L, 2012 R   s/p chemo/xrt  . COPD    resolved  . DIVERTICULOSIS, COLON 2008  . Dizziness   . Family history of breast cancer   . Family history of lung cancer   . Family history of lymphoma   . Family history of pancreatic cancer   . Family  history of uterine cancer   . GERD   . Hx of radiation therapy 10/19/11 -12/03/11   right breast  . HYPERLIPIDEMIA   . IRRITABLE BOWEL SYNDROME, HX OF   . Left-sided carotid artery disease (HCC)    moderate left ICA stenosis  . OSTEOARTHRITIS, HAND   . PSVT (paroxysmal supraventricular tachycardia) (HCC)    symptomatic on event monitor    SURGICAL HISTORY: Past Surgical History:  Procedure Laterality Date  . ABDOMINAL HYSTERECTOMY    . APPENDECTOMY    . BREAST BIOPSY  11/14/10    r breast: inv, insitu mammary carcinoma w/calcif, er/pr +, her2 -  . BREAST SURGERY     lumpectomy  . CATARACT EXTRACTION     both eyes  . ELECTROPHYSIOLOGIC STUDY N/A 05/10/2015   Procedure: SVT Ablation;  Surgeon: Will Cortland Ding, MD;  Location: [facility] INVASIVE CV LAB;  Service: Cardiovascular;  Laterality: N/A;  . HERNIA REPAIR    . inguinal herniorrhapy  1984   left  . IR US  GUIDE BX ASP/DRAIN  05/26/2019  . rectal fissure repair    . s/p benign breast biopsy  2003   right  . s/p left foot surgury  2009  . 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 spiral fx left foot  2008  no surgury  . TMJ ARTHROPLASTY  1989  . TONGUE SURGERY     1988- to remove scar tissue growth   . TONSILLECTOMY      SOCIAL HISTORY: Social History   Socioeconomic History  . Marital status: Divorced    Spouse name: 2 Step-children  . Number of children: 2  . Years of education: Not on file  . Highest education level: Bachelor's degree (e.g., BA, AB, BS)  Occupational History  . Occupation: retired Training and development officer  Tobacco Use  . Smoking status: Former    Current packs/day: 0.00    Average packs/day: 1 pack/day for 54.0 years (54.0 ttl pk-yrs)    Types: Cigarettes    Start date: 01/02/1962    Quit date: 01/03/2016    Years since quitting: 7.5  . Smokeless tobacco: Never  Vaping Use  . Vaping status: Never Used  Substance and Sexual Activity  . Alcohol use: Yes    Comment: rare/ drinks socially  . Drug use:  No  . Sexual activity: Never  Other Topics Concern  . Not on file  Social History Narrative   Patient gets no regular exercise   No biological children   2 step children   Social Drivers of Health   Financial Resource Strain: Low Risk  (11/07/2022)   Overall Financial Resource Strain (CARDIA)   . Difficulty of Paying Living Expenses: Not hard at all  Food Insecurity: No Food Insecurity (01/11/2023)   Hunger Vital Sign   . Worried About Programme researcher, broadcasting/film/video in the Last Year: Never true   . Ran Out of Food in the Last Year: Never true  Transportation Needs: No Transportation Needs (01/11/2023)   PRAPARE - Transportation   . Lack of Transportation (Medical): No   . Lack of Transportation (Non-Medical): No  Physical Activity: Inactive (11/07/2022)   Exercise Vital Sign   . Days of Exercise per Week: 0 days   . Minutes of Exercise per Session: 0 min  Stress: No Stress Concern Present (11/07/2022)   [employer] of Occupational Health - Occupational Stress Questionnaire   . Feeling of Stress : Not at all  Social Connections: Socially Isolated (11/07/2022)   Social Connection and Isolation Panel   . Frequency of Communication with Friends and Family: More than three times a week   . Frequency of Social Gatherings with Friends and Family: Once a week   . Attends Religious Services: Never   . Active Member of Clubs or Organizations: No   . Attends Banker Meetings: Never   . Marital Status: Divorced  Catering manager Violence: Not At Risk (01/11/2023)   Humiliation, Afraid, Rape, and Kick questionnaire   . Fear of Current or Ex-Partner: No   . Emotionally Abused: No   . Physically Abused: No   . Sexually Abused: No    FAMILY HISTORY: Family History  Problem Relation Age of Onset  . Hypertension Mother   . Stroke Mother   . Colon polyps Mother   . Diabetes Mother   . Pancreatic cancer Mother 24  . Uterine cancer Mother 34  . Lymphoma Brother        burkitts  .  Lung cancer Paternal Uncle   . Lung cancer Maternal Grandmother 36       non-smoker  . Lung cancer Maternal Grandfather   . Breast cancer Cousin        maternal cousin, dx in her mid 42s  . Brain cancer Cousin  maternal cousin's son; dx in his 24s  . Testicular cancer Cousin        maternal cousin's son;   . Breast cancer Cousin        paternal cousin; dx in her 35s  . Breast cancer Cousin        paternal cousin's daughter; dx in 77s; neg genetic testing  . Colon cancer Neg Hx   . Esophageal cancer Neg Hx   . Stomach cancer Neg Hx   . Rectal cancer Neg Hx     Review of Systems - Oncology    PHYSICAL EXAMINATION    Vitals:   07/22/23 1407  BP: (!) 127/51  Pulse: 95  Resp: 18  SpO2: 97%    Physical Exam  LABORATORY DATA:  CBC    Component Value Date/Time   WBC 2.9 (L) 07/22/2023 1345   WBC 3.6 (L) 06/23/2023 1530   RBC 2.89 (L) 07/22/2023 1345   HGB 9.8 (L) 07/22/2023 1345   HGB 12.4 12/09/2015 1408   HCT 28.9 (L) 07/22/2023 1345   HCT 25.8 (L) 07/03/2022 1345   HCT 36.2 12/09/2015 1408   PLT 150 07/22/2023 1345   PLT 238 12/09/2015 1408   MCV 100.0 07/22/2023 1345   MCV 89.8 12/09/2015 1408   MCH 33.9 07/22/2023 1345   MCHC 33.9 07/22/2023 1345   RDW 14.0 07/22/2023 1345   RDW 13.2 12/09/2015 1408   LYMPHSABS PENDING 07/22/2023 1345   LYMPHSABS 2.1 12/09/2015 1408   MONOABS PENDING 07/22/2023 1345   MONOABS 0.5 12/09/2015 1408   EOSABS PENDING 07/22/2023 1345   EOSABS 0.2 12/09/2015 1408   BASOSABS PENDING 07/22/2023 1345   BASOSABS 0.0 12/09/2015 1408    CMP     Component Value Date/Time   NA 141 05/26/2023 1505   NA 140 12/09/2015 1409   K 3.9 05/26/2023 1505   K 4.2 12/09/2015 1409   CL 108 05/26/2023 1505   CL 107 02/02/2012 0939   CO2 20 (L) 05/26/2023 1505   CO2 25 12/09/2015 1409   GLUCOSE 133 (H) 05/26/2023 1505   GLUCOSE 102 12/09/2015 1409   GLUCOSE 104 (H) 02/02/2012 0939   BUN 29 (H) 05/26/2023 1505   BUN 16.7  12/09/2015 1409   CREATININE 1.75 (H) 05/26/2023 1505   CREATININE 0.8 12/09/2015 1409   CALCIUM  9.6 05/26/2023 1505   CALCIUM  9.4 12/09/2015 1409   PROT 7.9 05/26/2023 1505   PROT 7.0 12/09/2015 1409   ALBUMIN 4.5 05/26/2023 1505   ALBUMIN 3.3 (L) 12/09/2015 1409   AST 19 05/26/2023 1505   AST 20 12/09/2015 1409   ALT 10 05/26/2023 1505   ALT 13 12/09/2015 1409   ALKPHOS 55 05/26/2023 1505   ALKPHOS 54 12/09/2015 1409   BILITOT 0.4 05/26/2023 1505   BILITOT <0.22 12/09/2015 1409   GFRNONAA 29 (L) 05/26/2023 1505   GFRAA >60 10/26/2019 1347   GFRAA >60 05/09/2019 1303     ASSESSMENT and THERAPY PLAN:   No problem-specific Assessment & Plan notes found for this encounter.     All questions were answered. The patient knows to call the clinic with any problems, questions or concerns. We can certainly see the patient much sooner if necessary.  Total encounter time:*** minutes*in face-to-face visit time, chart review, lab review, care coordination, order entry, and documentation of the encounter time.    Alwin Baars, NP 07/22/23 2:19 PM Medical Oncology and Hematology [facility] [address] [place], [place] [address] Tel. [phone]  Fax. [phone]  *Total Encounter Time as defined by the Centers for Medicare and Medicaid Services includes, in addition to the face-to-face time of a patient visit (documented in the note above) non-face-to-face time: obtaining and reviewing outside history, ordering and reviewing medications, tests or procedures, care coordination (communications with other health care professionals or caregivers) and documentation in the medical record.

## 2023-07-26 ENCOUNTER — Encounter: Payer: Self-pay | Admitting: Oncology

## 2023-07-26 NOTE — Assessment & Plan Note (Addendum)
 Assessment and Plan Assessment & Plan Breast cancer Undergoing treatment with Faslodex , anastrozole , and Ibrance . CT chest scheduled to evaluate disease status and dyspnea causes. Elevated BNP could suggest fluid balance issues. - Continue Faslodex , anastrozole , and Ibrance . - Order CT chest for June 26. - Monitor for signs of fluid overload or cardiac issues - Proceed with upcoming cardiology appt as scheduled.  Pneumonia Awaiting CT chest to assess resolution and residual infection. - Order CT chest to evaluate for resolution of pneumonia.  Diarrhea Chronic diarrhea managed with Imodium . Improvement with magnesium  dose adjustment. - Continue current management with Imodium  as needed. - Maintain magnesium  supplementation  General Health Maintenance Routine monitoring of kidney, liver function, and blood counts. Improved kidney function. - Continue routine lab monitoring. - Encourage adequate hydration, especially during summer months.

## 2023-07-29 ENCOUNTER — Ambulatory Visit (HOSPITAL_COMMUNITY)
Admission: RE | Admit: 2023-07-29 | Discharge: 2023-07-29 | Disposition: A | Discharge status: Home / Self Care | Source: Ambulatory Visit | Attending: Hematology and Oncology | Admitting: Hematology and Oncology

## 2023-07-29 ENCOUNTER — Other Ambulatory Visit: Payer: Self-pay

## 2023-07-29 DIAGNOSIS — R590 Localized enlarged lymph nodes: Secondary | ICD-10-CM | POA: Diagnosis not present

## 2023-07-29 DIAGNOSIS — C50212 Malignant neoplasm of upper-inner quadrant of left female breast: Secondary | ICD-10-CM | POA: Diagnosis not present

## 2023-07-29 DIAGNOSIS — K449 Diaphragmatic hernia without obstruction or gangrene: Secondary | ICD-10-CM | POA: Diagnosis not present

## 2023-07-29 DIAGNOSIS — Z170 Estrogen receptor positive status [ER+]: Secondary | ICD-10-CM | POA: Diagnosis not present

## 2023-07-29 DIAGNOSIS — I700 Atherosclerosis of aorta: Secondary | ICD-10-CM | POA: Diagnosis not present

## 2023-07-29 DIAGNOSIS — J439 Emphysema, unspecified: Secondary | ICD-10-CM | POA: Diagnosis not present

## 2023-08-03 ENCOUNTER — Other Ambulatory Visit: Payer: Self-pay

## 2023-08-03 NOTE — Progress Notes (Signed)
 Specialty Pharmacy Ongoing Clinical Assessment Note  Stacey [name] is a 83 y.o. female who is being followed by the specialty pharmacy service for [facility] On[facility]   Patient's specialty medication(s) reviewed today: Palbociclib  (IBRANCE )   Missed doses in the last 4 weeks: 0   Patient/Caregiver did not have any additional questions or concerns.   Therapeutic benefit summary: Patient is achieving benefit   Adverse events/side effects summary: No adverse events/side effects (diarrhea has decreased significantly since dose was decreased to 75mg )   Patient's therapy is appropriate to: Continue    Goals Addressed             This Visit's Progress    Stabilization of disease       Patient is on track. Patient will maintain adherence          Follow up: 6 months  Marian Grandt M Cordarro Spinnato Specialty Pharmacist

## 2023-08-05 DIAGNOSIS — H40013 Open angle with borderline findings, low risk, bilateral: Secondary | ICD-10-CM | POA: Diagnosis not present

## 2023-08-05 DIAGNOSIS — H40042 Steroid responder, left eye: Secondary | ICD-10-CM | POA: Diagnosis not present

## 2023-08-05 DIAGNOSIS — H1045 Other chronic allergic conjunctivitis: Secondary | ICD-10-CM | POA: Diagnosis not present

## 2023-08-09 ENCOUNTER — Encounter: Payer: Self-pay | Admitting: Hematology and Oncology

## 2023-08-10 ENCOUNTER — Other Ambulatory Visit: Payer: Self-pay | Admitting: *Deleted

## 2023-08-10 ENCOUNTER — Encounter: Payer: Self-pay | Admitting: Internal Medicine

## 2023-08-10 ENCOUNTER — Ambulatory Visit (INDEPENDENT_AMBULATORY_CARE_PROVIDER_SITE_OTHER): Payer: Medicare Other | Admitting: Internal Medicine

## 2023-08-10 VITALS — BP 128/68 | HR 87 | Temp 98.2°F | Ht 60.3 in | Wt 138.8 lb

## 2023-08-10 DIAGNOSIS — R739 Hyperglycemia, unspecified: Secondary | ICD-10-CM | POA: Diagnosis not present

## 2023-08-10 DIAGNOSIS — I700 Atherosclerosis of aorta: Secondary | ICD-10-CM | POA: Insufficient documentation

## 2023-08-10 DIAGNOSIS — J449 Chronic obstructive pulmonary disease, unspecified: Secondary | ICD-10-CM

## 2023-08-10 DIAGNOSIS — I5030 Unspecified diastolic (congestive) heart failure: Secondary | ICD-10-CM | POA: Diagnosis not present

## 2023-08-10 DIAGNOSIS — N1831 Chronic kidney disease, stage 3a: Secondary | ICD-10-CM

## 2023-08-10 DIAGNOSIS — I10 Essential (primary) hypertension: Secondary | ICD-10-CM

## 2023-08-10 NOTE — Progress Notes (Signed)
 Patient ID: Stacey [name], female   DOB: December 06, 1940, 83 y.o.   MRN: [id number]        Chief Complaint: follow up chf, copd, hyperglycemia, htn       HPI:  Stacey [name] is a 83 y.o. female here overall doing ok, Pt denies chest pain, increased sob or doe, wheezing, orthopnea, PND, increased LE swelling, palpitations, dizziness or syncope.   Pt denies polydipsia, polyuria, or new focal neuro s/s.  Has new left supraclavicular node on positive PET scan  For possible biopsy soon.  Has f/u with pulm soon.  Seen and tx for pneumonia previously.   Wt Readings from Last 3 Encounters:  08/10/23 138 lb 12.8 oz (63 kg)  07/22/23 139 lb 8 oz (63.3 kg)  06/16/23 141 lb 9.6 oz (64.2 kg)   BP Readings from Last 3 Encounters:  08/10/23 128/68  07/22/23 (!) 127/51  06/23/23 126/64         Past Medical History:  Diagnosis Date   ANEMIA-NOS    ANXIETY    Blood transfusion without reported diagnosis Nov. 2024   Breast cancer (HCC) 1997 L, 2012 R   s/p chemo/xrt   Cataract 2015   CHF (congestive heart failure) (HCC) Nov. 2024   COPD    resolved   DIVERTICULOSIS, COLON 2008   Dizziness    Emphysema of lung (HCC) NA   Family history of breast cancer    Family history of lung cancer    Family history of lymphoma    Family history of pancreatic cancer    Family history of uterine cancer    GERD    Hx of radiation therapy 10/19/11 -12/03/11   right breast   HYPERLIPIDEMIA    IRRITABLE BOWEL SYNDROME, HX OF    Left-sided carotid artery disease (HCC)    moderate left ICA stenosis   OSTEOARTHRITIS, HAND    PSVT (paroxysmal supraventricular tachycardia) (HCC)    symptomatic on event monitor   Past Surgical History:  Procedure Laterality Date   ABDOMINAL HYSTERECTOMY     APPENDECTOMY     BREAST BIOPSY  11/14/2010   r breast: inv, insitu mammary carcinoma w/calcif, er/pr +, her2 -   BREAST SURGERY     lumpectomy   CATARACT EXTRACTION     both eyes   COSMETIC SURGERY  1989   ELECTROPHYSIOLOGIC  STUDY N/A 05/10/2015   Procedure: SVT Ablation;  Surgeon: Will Gladis Norton, MD;  Location: [facility];  Service: Cardiovascular;  Laterality: N/A;   HERNIA REPAIR     inguinal herniorrhapy  02/02/1982   left   IR US  GUIDE BX ASP/DRAIN  05/26/2019   rectal fissure repair     s/p benign breast biopsy  02/02/2001   right   s/p left foot surgury  02/03/2007   s/p lumpectomy  02/03/1995   melignant left x 2   spiral fx left foot  02/02/2006   no surgury   TMJ ARTHROPLASTY  02/03/1987   TONGUE SURGERY     1988- to remove scar tissue growth    TONSILLECTOMY      reports that she quit smoking about 7 years ago. Her smoking use included cigarettes. She started smoking about 61 years ago. She has a 54 pack-year smoking history. She has never used smokeless tobacco. She reports current alcohol use. She reports that she does not use drugs. family history includes Alcohol abuse in her father; Asthma in her brother and brother; Brain cancer in her  cousin; Breast cancer in her cousin, cousin, and cousin; Cancer in her brother and mother; Colon polyps in her mother; Diabetes in her mother; Hypertension in her mother; Lung cancer in her maternal grandfather and paternal uncle; Lung cancer (age of onset: 31) in her maternal grandmother; Lymphoma in her brother; Pancreatic cancer (age of onset: 48) in her mother; Stroke in her mother; Testicular cancer in her cousin; Uterine cancer (age of onset: 63) in her mother. Allergies  Allergen Reactions   Other Nausea Only and Other (See Comments)    A lot of antibiotics cause extreme nausea   Tape Other (See Comments)    PAPER TAPE IS MUCH PREFERRED   Aminoglycosides Other (See Comments)    Unknown reaction - pt is not sure where this entry came from   Bacitracin Other (See Comments)    Reaction not recalled   Bee Venom Swelling and Other (See Comments)    Severe body swelling   Cephalexin  Nausea Only    Other Reaction(s): Not available    Clindamycin/Lincomycin Diarrhea and Nausea And Vomiting   Codeine Nausea And Vomiting and Other (See Comments)    Pt can take codeine cough syrup   Nasacort  [Triamcinolone ] Other (See Comments)    Caused pressure in her eyes. Pt did not like Nasacort  or anything similar    Current Outpatient Medications on File Prior to Visit  Medication Sig Dispense Refill   acetaminophen  (TYLENOL ) 500 MG tablet Take 1,000 mg by mouth every 8 (eight) hours as needed for mild pain (pain score 1-3) or moderate pain (pain score 4-6).     anastrozole  (ARIMIDEX ) 1 MG tablet TAKE 1 TABLET(1 MG) BY MOUTH DAILY 90 tablet 0   bimatoprost (LUMIGAN) 0.01 % SOLN 1 drop at bedtime.     CALCIUM  CARBONATE-VITAMIN D  PO Take 1 tablet by mouth 2 (two) times daily.     cholecalciferol (VITAMIN D3) 25 MCG (1000 UNIT) tablet Take 1,000 Units by mouth daily.     ELIQUIS  5 MG TABS tablet TAKE 1 TABLET(5 MG) BY MOUTH TWICE DAILY 90 tablet 1   furosemide  (LASIX ) 40 MG tablet Take 1 tablet (40 mg total) by mouth daily as needed for up to 3 days for fluid or edema (dyspnea, weight gain of 3 lbs in 24 hours). 10 tablet 0   loperamide  (IMODIUM  A-D) 2 MG tablet Take 2 mg by mouth 4 (four) times daily as needed for diarrhea or loose stools.     loratadine  (CLARITIN ) 10 MG tablet Take 10 mg by mouth every morning.     Multiple Vitamins-Minerals (MULTIVITAMIN WITH MINERALS) tablet Take 1 tablet by mouth in the morning.     omeprazole  (PRILOSEC) 40 MG capsule Take 1 capsule (40 mg total) by mouth daily. 90 capsule 3   palbociclib  (IBRANCE ) 75 MG tablet Take 1 tablet (75 mg total) by mouth daily. Take for 21 days on, 7 days off, repeat every 28 days. 21 tablet 3   vitamin B-12 (CYANOCOBALAMIN ) 100 MCG tablet Take 100 mcg by mouth daily.     zolpidem  (AMBIEN ) 10 MG tablet TAKE 1 TABLET(10 MG) BY MOUTH AT BEDTIME AS NEEDED FOR SLEEP 90 tablet 1   No current facility-administered medications on file prior to visit.        ROS:  All others  reviewed and negative.  Objective        PE:  BP 128/68   Pulse 87   Temp 98.2 F (36.8 C)   Ht 5' 0.3 (1.532  m)   Wt 138 lb 12.8 oz (63 kg)   SpO2 97%   BMI 26.84 kg/m                 Constitutional: Pt appears in NAD               HENT: Head: NCAT.                Right Ear: External ear normal.                 Left Ear: External ear normal.                Eyes: . Pupils are equal, round, and reactive to light. Conjunctivae and EOM are normal               Nose: without d/c or deformity               Neck: Neck supple. Gross normal ROM               Cardiovascular: Normal rate and regular rhythm.                 Pulmonary/Chest: Effort normal and breath sounds without rales or wheezing.                Abd:  Soft, NT, ND, + BS, no organomegaly               Neurological: Pt is alert. At baseline orientation, motor grossly intact               Skin: Skin is warm. No rashes, no other new lesions, LE edema - none               Psychiatric: Pt behavior is normal without agitation   Micro: none  Cardiac tracings I have personally interpreted today:  none  Pertinent Radiological findings (summarize): none   Lab Results  Component Value Date   WBC 2.9 (L) 07/22/2023   HGB 9.8 (L) 07/22/2023   HCT 28.9 (L) 07/22/2023   PLT 150 07/22/2023   GLUCOSE 119 (H) 07/22/2023   CHOL 193 07/13/2018   TRIG 231.0 (H) 07/13/2018   HDL 49.40 07/13/2018   LDLDIRECT 119.0 07/13/2018   LDLCALC 84 07/04/2009   ALT 9 07/22/2023   AST 19 07/22/2023   NA 140 07/22/2023   K 3.8 07/22/2023   CL 110 07/22/2023   CREATININE 1.63 (H) 07/22/2023   BUN 32 (H) 07/22/2023   CO2 19 (L) 07/22/2023   TSH 0.784 12/05/2022   INR 3.2 (H) 12/05/2022   HGBA1C 6.6 (H) 07/13/2018   Assessment/Plan:  Stacey [name] is a 83 y.o. White or Caucasian [1] female with  has a past medical history of ANEMIA-NOS, ANXIETY, Blood transfusion without reported diagnosis (Nov. 2024), Breast cancer [facility]) (1997 L, 2012 R),  Cataract (2015), CHF (congestive heart failure) (HCC) (Nov. 2024), COPD, DIVERTICULOSIS, COLON (2008), Dizziness, Emphysema of lung (HCC) (NA), Family history of breast cancer, Family history of lung cancer, Family history of lymphoma, Family history of pancreatic cancer, Family history of uterine cancer, GERD, radiation therapy (10/19/11 -12/03/11), HYPERLIPIDEMIA, IRRITABLE BOWEL SYNDROME, HX OF, Left-sided carotid artery disease (HCC), OSTEOARTHRITIS, HAND, and PSVT (paroxysmal supraventricular tachycardia) (HCC).  Diastolic CHF (HCC) Stable volume, cont current med tx  COPD GOLD I  Currently stable, continue inhaler prn  Hyperglycemia Lab Results  Component Value Date   HGBA1C 6.6 (H) 07/13/2018   Stable, pt to  continue current medical treatment   - diet, wt control   HTN (hypertension) BP Readings from Last 3 Encounters:  08/10/23 128/68  07/22/23 (!) 127/51  06/23/23 126/64   Stable, pt to continue medical treatment  - diet, wt control   CKD (chronic kidney disease), stage III (HCC) Lab Results  Component Value Date   CREATININE 1.63 (H) 07/22/2023   Stable overall, cont to avoid nephrotoxins  Followup: No follow-ups on file.  Lynwood Rush, MD 08/10/2023 8:14 PM [facility]

## 2023-08-10 NOTE — Patient Instructions (Signed)
Please continue all other medications as before, and refills have been done if requested.  Please have the pharmacy call with any other refills you may need.  Please continue your efforts at being more active, low cholesterol diet, and weight control.  Please keep your appointments with your specialists as you may have planned  Please make an Appointment to return in 6 months, or sooner if needed 

## 2023-08-10 NOTE — Assessment & Plan Note (Signed)
 Lab Results  Component Value Date   CREATININE 1.63 (H) 07/22/2023   Stable overall, cont to avoid nephrotoxins

## 2023-08-10 NOTE — Assessment & Plan Note (Signed)
 BP Readings from Last 3 Encounters:  08/10/23 128/68  07/22/23 (!) 127/51  06/23/23 126/64   Stable, pt to continue medical treatment  - diet, wt control

## 2023-08-10 NOTE — Assessment & Plan Note (Signed)
 Lab Results  Component Value Date   HGBA1C 6.6 (H) 07/13/2018   Stable, pt to continue current medical treatment   - diet, wt control

## 2023-08-10 NOTE — Assessment & Plan Note (Signed)
 Currently stable, continue inhaler prn

## 2023-08-10 NOTE — Assessment & Plan Note (Signed)
Stable volume, cont current med tx 

## 2023-08-18 ENCOUNTER — Inpatient Hospital Stay

## 2023-08-18 ENCOUNTER — Inpatient Hospital Stay: Attending: Adult Health

## 2023-08-18 VITALS — BP 146/66 | HR 89 | Temp 98.3°F | Resp 16

## 2023-08-18 DIAGNOSIS — C50212 Malignant neoplasm of upper-inner quadrant of left female breast: Secondary | ICD-10-CM | POA: Diagnosis not present

## 2023-08-18 DIAGNOSIS — Z79818 Long term (current) use of other agents affecting estrogen receptors and estrogen levels: Secondary | ICD-10-CM | POA: Diagnosis not present

## 2023-08-18 DIAGNOSIS — C7951 Secondary malignant neoplasm of bone: Secondary | ICD-10-CM

## 2023-08-18 LAB — MAGNESIUM: Magnesium: 1.6 mg/dL — ABNORMAL LOW (ref 1.7–2.4)

## 2023-08-18 LAB — CBC WITH DIFFERENTIAL (CANCER CENTER ONLY)
Abs Immature Granulocytes: 0.01 K/uL (ref 0.00–0.07)
Basophils Absolute: 0 K/uL (ref 0.0–0.1)
Basophils Relative: 1 %
Eosinophils Absolute: 0 K/uL (ref 0.0–0.5)
Eosinophils Relative: 1 %
HCT: 28.2 % — ABNORMAL LOW (ref 36.0–46.0)
Hemoglobin: 9.8 g/dL — ABNORMAL LOW (ref 12.0–15.0)
Immature Granulocytes: 0 %
Lymphocytes Relative: 25 %
Lymphs Abs: 0.8 K/uL (ref 0.7–4.0)
MCH: 34.4 pg — ABNORMAL HIGH (ref 26.0–34.0)
MCHC: 34.8 g/dL (ref 30.0–36.0)
MCV: 98.9 fL (ref 80.0–100.0)
Monocytes Absolute: 0.3 K/uL (ref 0.1–1.0)
Monocytes Relative: 10 %
Neutro Abs: 2 K/uL (ref 1.7–7.7)
Neutrophils Relative %: 63 %
Platelet Count: 170 K/uL (ref 150–400)
RBC: 2.85 MIL/uL — ABNORMAL LOW (ref 3.87–5.11)
RDW: 14.4 % (ref 11.5–15.5)
Smear Review: NORMAL
WBC Count: 3.2 K/uL — ABNORMAL LOW (ref 4.0–10.5)
nRBC: 0 % (ref 0.0–0.2)

## 2023-08-18 LAB — CMP (CANCER CENTER ONLY)
ALT: 10 U/L (ref 0–44)
AST: 18 U/L (ref 15–41)
Albumin: 4.2 g/dL (ref 3.5–5.0)
Alkaline Phosphatase: 59 U/L (ref 38–126)
Anion gap: 11 (ref 5–15)
BUN: 26 mg/dL — ABNORMAL HIGH (ref 8–23)
CO2: 20 mmol/L — ABNORMAL LOW (ref 22–32)
Calcium: 9.3 mg/dL (ref 8.9–10.3)
Chloride: 110 mmol/L (ref 98–111)
Creatinine: 1.45 mg/dL — ABNORMAL HIGH (ref 0.44–1.00)
GFR, Estimated: 36 mL/min — ABNORMAL LOW (ref >60)
Glucose, Bld: 107 mg/dL — ABNORMAL HIGH (ref 70–99)
Potassium: 4 mmol/L (ref 3.5–5.1)
Sodium: 141 mmol/L (ref 135–145)
Total Bilirubin: 0.3 mg/dL (ref 0.0–1.2)
Total Protein: 7.5 g/dL (ref 6.5–8.1)

## 2023-08-18 MED ORDER — FULVESTRANT 250 MG/5ML IM SOSY
500.0000 mg | PREFILLED_SYRINGE | Freq: Once | INTRAMUSCULAR | Status: AC
  Administered 2023-08-18: 500 mg via INTRAMUSCULAR
  Filled 2023-08-18: qty 10

## 2023-08-19 ENCOUNTER — Other Ambulatory Visit: Payer: Self-pay | Admitting: *Deleted

## 2023-08-19 DIAGNOSIS — Z170 Estrogen receptor positive status [ER+]: Secondary | ICD-10-CM

## 2023-08-19 DIAGNOSIS — C50912 Malignant neoplasm of unspecified site of left female breast: Secondary | ICD-10-CM

## 2023-08-19 DIAGNOSIS — C7951 Secondary malignant neoplasm of bone: Secondary | ICD-10-CM

## 2023-08-20 ENCOUNTER — Other Ambulatory Visit: Payer: Self-pay | Admitting: Hematology and Oncology

## 2023-08-20 NOTE — Progress Notes (Unsigned)
 Luverne Aran, MD  Baldwin Channing CROME OK for US  guided left supraclavicular LN biopsy. Local only.  GY       Previous Messages    ----- Message ----- From: Baldwin Channing CROME Sent: 08/19/2023   3:57 PM EDT To: Channing CROME Baldwin; Ir Procedure Requests Subject: CT US  GUIDED BIOPSY                            Procedure :CT US  GUIDED BIOPSY  Reason :known metastatic breast cancer Dx: Malignant neoplasm of upper-inner quadrant of left breast in female, estrogen receptor positive,Malignant neoplasm metastatic to bone, Recurrent cancer of left breast    History :CT Chest Wo Contrast ,DG Chest 2 View ,CT BRAINLAB SINUS W/O CONTRAST ,NM PET Image Restage (PS) Skull Base to Thigh (F-18 FDG)  Provider: Crawford Morna Pickle, NP  Provider contact ; [phone]

## 2023-08-22 ENCOUNTER — Encounter: Payer: Self-pay | Admitting: Oncology

## 2023-08-25 ENCOUNTER — Other Ambulatory Visit: Payer: Self-pay

## 2023-08-25 ENCOUNTER — Other Ambulatory Visit: Payer: Self-pay | Admitting: Pharmacy Technician

## 2023-08-25 NOTE — Progress Notes (Signed)
 Specialty Pharmacy Refill Coordination Note  Stacey [name] is a 83 y.o. female contacted today regarding refills of specialty medication(s) Palbociclib  (IBRANCE )   Patient requested Delivery   Delivery date: 08/27/23   Verified address: [address]   [place] [address]   Medication will be filled on 08/26/23.

## 2023-08-26 ENCOUNTER — Ambulatory Visit: Payer: Self-pay | Admitting: Hematology and Oncology

## 2023-08-26 ENCOUNTER — Other Ambulatory Visit: Payer: Self-pay

## 2023-08-26 ENCOUNTER — Telehealth: Payer: Self-pay | Admitting: Hematology and Oncology

## 2023-08-26 DIAGNOSIS — L814 Other melanin hyperpigmentation: Secondary | ICD-10-CM | POA: Diagnosis not present

## 2023-08-26 DIAGNOSIS — D225 Melanocytic nevi of trunk: Secondary | ICD-10-CM | POA: Diagnosis not present

## 2023-08-26 DIAGNOSIS — L821 Other seborrheic keratosis: Secondary | ICD-10-CM | POA: Diagnosis not present

## 2023-08-26 DIAGNOSIS — D1801 Hemangioma of skin and subcutaneous tissue: Secondary | ICD-10-CM | POA: Diagnosis not present

## 2023-08-26 DIAGNOSIS — D485 Neoplasm of uncertain behavior of skin: Secondary | ICD-10-CM | POA: Diagnosis not present

## 2023-08-26 DIAGNOSIS — D045 Carcinoma in situ of skin of trunk: Secondary | ICD-10-CM | POA: Diagnosis not present

## 2023-08-26 DIAGNOSIS — L570 Actinic keratosis: Secondary | ICD-10-CM | POA: Diagnosis not present

## 2023-08-26 DIAGNOSIS — L578 Other skin changes due to chronic exposure to nonionizing radiation: Secondary | ICD-10-CM | POA: Diagnosis not present

## 2023-08-26 DIAGNOSIS — Z86018 Personal history of other benign neoplasm: Secondary | ICD-10-CM | POA: Diagnosis not present

## 2023-08-26 NOTE — Telephone Encounter (Signed)
 Left patient a vm regarding upcoming appointment

## 2023-09-07 ENCOUNTER — Other Ambulatory Visit: Payer: Self-pay | Admitting: Interventional Radiology

## 2023-09-08 ENCOUNTER — Ambulatory Visit (HOSPITAL_COMMUNITY)
Admission: RE | Admit: 2023-09-08 | Discharge: 2023-09-08 | Disposition: A | Discharge status: Home / Self Care | Source: Ambulatory Visit | Attending: Radiology | Admitting: Radiology

## 2023-09-08 ENCOUNTER — Ambulatory Visit (HOSPITAL_COMMUNITY)
Admission: RE | Admit: 2023-09-08 | Discharge: 2023-09-08 | Disposition: A | Discharge status: Home / Self Care | Source: Ambulatory Visit | Attending: Adult Health | Admitting: Adult Health

## 2023-09-08 ENCOUNTER — Encounter (HOSPITAL_COMMUNITY): Payer: Self-pay

## 2023-09-08 DIAGNOSIS — C770 Secondary and unspecified malignant neoplasm of lymph nodes of head, face and neck: Secondary | ICD-10-CM | POA: Insufficient documentation

## 2023-09-08 DIAGNOSIS — C50212 Malignant neoplasm of upper-inner quadrant of left female breast: Secondary | ICD-10-CM | POA: Insufficient documentation

## 2023-09-08 DIAGNOSIS — Z86000 Personal history of in-situ neoplasm of breast: Secondary | ICD-10-CM

## 2023-09-08 DIAGNOSIS — C50912 Malignant neoplasm of unspecified site of left female breast: Secondary | ICD-10-CM

## 2023-09-08 DIAGNOSIS — C7951 Secondary malignant neoplasm of bone: Secondary | ICD-10-CM | POA: Diagnosis not present

## 2023-09-08 DIAGNOSIS — R591 Generalized enlarged lymph nodes: Secondary | ICD-10-CM | POA: Diagnosis not present

## 2023-09-08 DIAGNOSIS — C50919 Malignant neoplasm of unspecified site of unspecified female breast: Secondary | ICD-10-CM | POA: Diagnosis not present

## 2023-09-08 DIAGNOSIS — Z170 Estrogen receptor positive status [ER+]: Secondary | ICD-10-CM | POA: Insufficient documentation

## 2023-09-08 MED ORDER — LIDOCAINE HCL (PF) 1 % IJ SOLN
6.0000 mL | Freq: Once | INTRAMUSCULAR | Status: AC
  Administered 2023-09-08: 6 mL via INTRADERMAL

## 2023-09-08 NOTE — Procedures (Signed)
  Procedure:  US  core biopsy L supraclavicular adenopathy 18g Preprocedure diagnosis: Breast DCIS Postprocedure diagnosis: same EBL:    minimal Complications:   none immediate  See full dictation in [employer].  CHARM Toribio Faes MD Main # [phone] Pager  [phone] Mobile [phone]

## 2023-09-13 ENCOUNTER — Other Ambulatory Visit: Payer: Self-pay

## 2023-09-13 ENCOUNTER — Other Ambulatory Visit (HOSPITAL_COMMUNITY): Payer: Self-pay

## 2023-09-13 ENCOUNTER — Encounter (INDEPENDENT_AMBULATORY_CARE_PROVIDER_SITE_OTHER): Payer: Self-pay

## 2023-09-13 NOTE — Progress Notes (Signed)
 Specialty Pharmacy Refill Coordination Note  Stacey [name] is a 83 y.o. female contacted today regarding refills of specialty medication(s) Palbociclib  (IBRANCE )   Patient requested Delivery   Delivery date: 09/17/23   Verified address: (Patient-Rptd) [address] [name] [address]   Medication will be filled on 09/16/23.

## 2023-09-14 ENCOUNTER — Other Ambulatory Visit: Payer: Self-pay

## 2023-09-14 NOTE — Progress Notes (Signed)
 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 No DDIs identified with Ibrance .   Clinical Intervention Outcomes: Prevention of an adverse drug event   Advertising account planner

## 2023-09-15 ENCOUNTER — Inpatient Hospital Stay

## 2023-09-15 ENCOUNTER — Inpatient Hospital Stay: Attending: Adult Health

## 2023-09-15 ENCOUNTER — Other Ambulatory Visit: Payer: Self-pay | Admitting: *Deleted

## 2023-09-15 ENCOUNTER — Inpatient Hospital Stay: Admitting: Hematology and Oncology

## 2023-09-15 VITALS — BP 142/61 | HR 79 | Temp 98.0°F | Resp 16 | Wt 137.8 lb

## 2023-09-15 DIAGNOSIS — Z803 Family history of malignant neoplasm of breast: Secondary | ICD-10-CM | POA: Diagnosis not present

## 2023-09-15 DIAGNOSIS — M8580 Other specified disorders of bone density and structure, unspecified site: Secondary | ICD-10-CM | POA: Insufficient documentation

## 2023-09-15 DIAGNOSIS — Z9221 Personal history of antineoplastic chemotherapy: Secondary | ICD-10-CM | POA: Diagnosis not present

## 2023-09-15 DIAGNOSIS — I5032 Chronic diastolic (congestive) heart failure: Secondary | ICD-10-CM | POA: Insufficient documentation

## 2023-09-15 DIAGNOSIS — Z923 Personal history of irradiation: Secondary | ICD-10-CM | POA: Diagnosis not present

## 2023-09-15 DIAGNOSIS — C50212 Malignant neoplasm of upper-inner quadrant of left female breast: Secondary | ICD-10-CM | POA: Insufficient documentation

## 2023-09-15 DIAGNOSIS — E785 Hyperlipidemia, unspecified: Secondary | ICD-10-CM | POA: Insufficient documentation

## 2023-09-15 DIAGNOSIS — Z1721 Progesterone receptor positive status: Secondary | ICD-10-CM | POA: Diagnosis not present

## 2023-09-15 DIAGNOSIS — Z801 Family history of malignant neoplasm of trachea, bronchus and lung: Secondary | ICD-10-CM | POA: Insufficient documentation

## 2023-09-15 DIAGNOSIS — Z170 Estrogen receptor positive status [ER+]: Secondary | ICD-10-CM

## 2023-09-15 DIAGNOSIS — Z806 Family history of leukemia: Secondary | ICD-10-CM | POA: Insufficient documentation

## 2023-09-15 DIAGNOSIS — I890 Lymphedema, not elsewhere classified: Secondary | ICD-10-CM | POA: Diagnosis not present

## 2023-09-15 DIAGNOSIS — Z86718 Personal history of other venous thrombosis and embolism: Secondary | ICD-10-CM | POA: Insufficient documentation

## 2023-09-15 DIAGNOSIS — Z79811 Long term (current) use of aromatase inhibitors: Secondary | ICD-10-CM | POA: Diagnosis not present

## 2023-09-15 DIAGNOSIS — I130 Hypertensive heart and chronic kidney disease with heart failure and stage 1 through stage 4 chronic kidney disease, or unspecified chronic kidney disease: Secondary | ICD-10-CM | POA: Diagnosis not present

## 2023-09-15 DIAGNOSIS — N183 Chronic kidney disease, stage 3 unspecified: Secondary | ICD-10-CM | POA: Diagnosis not present

## 2023-09-15 DIAGNOSIS — Z79899 Other long term (current) drug therapy: Secondary | ICD-10-CM | POA: Insufficient documentation

## 2023-09-15 DIAGNOSIS — Z800 Family history of malignant neoplasm of digestive organs: Secondary | ICD-10-CM | POA: Diagnosis not present

## 2023-09-15 DIAGNOSIS — Z87891 Personal history of nicotine dependence: Secondary | ICD-10-CM | POA: Insufficient documentation

## 2023-09-15 DIAGNOSIS — K529 Noninfective gastroenteritis and colitis, unspecified: Secondary | ICD-10-CM | POA: Insufficient documentation

## 2023-09-15 DIAGNOSIS — C7951 Secondary malignant neoplasm of bone: Secondary | ICD-10-CM

## 2023-09-15 LAB — CMP (CANCER CENTER ONLY)
ALT: 10 U/L (ref 0–44)
AST: 19 U/L (ref 15–41)
Albumin: 4.5 g/dL (ref 3.5–5.0)
Alkaline Phosphatase: 57 U/L (ref 38–126)
Anion gap: 10 (ref 5–15)
BUN: 31 mg/dL — ABNORMAL HIGH (ref 8–23)
CO2: 22 mmol/L (ref 22–32)
Calcium: 9.5 mg/dL (ref 8.9–10.3)
Chloride: 108 mmol/L (ref 98–111)
Creatinine: 1.66 mg/dL — ABNORMAL HIGH (ref 0.44–1.00)
GFR, Estimated: 30 mL/min — ABNORMAL LOW (ref >60)
Glucose, Bld: 109 mg/dL — ABNORMAL HIGH (ref 70–99)
Potassium: 4 mmol/L (ref 3.5–5.1)
Sodium: 140 mmol/L (ref 135–145)
Total Bilirubin: 0.4 mg/dL (ref 0.0–1.2)
Total Protein: 7.6 g/dL (ref 6.5–8.1)

## 2023-09-15 LAB — CBC WITH DIFFERENTIAL (CANCER CENTER ONLY)
Abs Immature Granulocytes: 0.01 K/uL (ref 0.00–0.07)
Basophils Absolute: 0 K/uL (ref 0.0–0.1)
Basophils Relative: 1 %
Eosinophils Absolute: 0.1 K/uL (ref 0.0–0.5)
Eosinophils Relative: 2 %
HCT: 30.8 % — ABNORMAL LOW (ref 36.0–46.0)
Hemoglobin: 10.4 g/dL — ABNORMAL LOW (ref 12.0–15.0)
Immature Granulocytes: 0 %
Lymphocytes Relative: 22 %
Lymphs Abs: 0.6 K/uL — ABNORMAL LOW (ref 0.7–4.0)
MCH: 34 pg (ref 26.0–34.0)
MCHC: 33.8 g/dL (ref 30.0–36.0)
MCV: 100.7 fL — ABNORMAL HIGH (ref 80.0–100.0)
Monocytes Absolute: 0.3 K/uL (ref 0.1–1.0)
Monocytes Relative: 9 %
Neutro Abs: 1.8 K/uL (ref 1.7–7.7)
Neutrophils Relative %: 66 %
Platelet Count: 184 K/uL (ref 150–400)
RBC: 3.06 MIL/uL — ABNORMAL LOW (ref 3.87–5.11)
RDW: 14.6 % (ref 11.5–15.5)
WBC Count: 2.7 K/uL — ABNORMAL LOW (ref 4.0–10.5)
nRBC: 0 % (ref 0.0–0.2)

## 2023-09-15 LAB — MAGNESIUM: Magnesium: 1.9 mg/dL (ref 1.7–2.4)

## 2023-09-15 MED ORDER — FULVESTRANT 250 MG/5ML IM SOSY
500.0000 mg | PREFILLED_SYRINGE | Freq: Once | INTRAMUSCULAR | Status: AC
Start: 2023-09-15 — End: 2023-09-15
  Administered 2023-09-15 (×2): 500 mg via INTRAMUSCULAR
  Filled 2023-09-15: qty 10

## 2023-09-15 NOTE — Progress Notes (Signed)
 [facility] Cancer Follow up:    [name], MD [address] 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   #2 status post 4 cycles of doxorubicin and cyclophosphamide,                          (i) followed by radiation therapy under the care of Dr. Jadine.   #3 received tamoxifen  for a total of seven years    RIGHT BREAST #4  S/P biopsy of the RIGHT breast upper inner quadrant on 11/14/10 showing invasive ductal carcinoma,, grade 2, estrogen receptor 85% and progesterone receptor 57% positive, Ki67 20%, HER2 not amplified.     #5 started neoadjuvant letrozole  in November 2012; switched to tamoxifen  as of February 2016 due to osteopenia concerns   #6  S/P right lumpectomy with sentinel lymph node biopsy on 09/03/11 for a ypT2, ypN1a, stage IIB invasive lobular carcinoma, grade 2,estrogen receptor 97% positive, progesterone receptor 12% positive, with no HER-2 amplification.   #7 status post right breast radiation therapy under the care of Dr. Patrcia from 10/19/2011 to 12/03/2011.    #8 did not meet criteria for genetic testing according to her insurance company.   #9 osteopenia, with a T score of -1.8 on DEXA scan at [facility] 03/07/2013             (a) status post multiple dental extractions and implants             (b) repeat bone density at [facility] 04/02/2015 shows a T score of -2.0   METASTATIC DISEASE: April 2021 (lymph nodes, bone)   #10:  left upper extremity lymphedema led to chest CT scan 05/09/2019 showing a 5.1 cm left subpectoral chest wall mass and thoracic lymphadenopathy              (a) CT biopsy of the chest wall mass 05/25/2019 confirms recurrent breast cancer, strongly estrogen and  progesterone receptor positive, HER-2 not amplified             (b) PET scan 05/30/2019 shows a left subpectoral mass measuring 5.4 cm, with significant regional and mediastinal adenopathy, sclerotic left scapular metastasis, but no liver or lung involvement.             (c) CA 27-29 is moderately informative   #11 anastrozole  started 06/02/2019; palbociclib  added 06/08/2019             (a) palbociclib  taken irregularly for several months secondary to cytopenias             (b) palbociclib  dose decreased to 100 mg daily 21 on 7 off October 2021             (c) CT of the chest with contrast 11/09/2019 shows mild disease progression (despite a continuing drop in the CA 27-29)             (d) fulvestrant  added 11/23/2019             (e) repeat chest CT with contrast 09/09/2020 read as stable.   #12 denosumab /Xgeva  starting 06/08/2019, to be repeated every 3 months due to hypocalcemia             (  a) treatment held after August 2022 dose secondary to ongoing dental issues   #13 genetics testing 06/15/2019 through the [facility] Common Hereditary Cancers Panel found no deleterious mutations in APC, ATM, AXIN2, BARD1, BMPR1A, BRCA1, BRCA2, BRIP1, CDH1, CDKN2A (p14ARF), CDKN2A (p16INK4a), CKD4, CHEK2, CTNNA1, DICER1, EPCAM (Deletion/duplication testing only), GREM1 (promoter region deletion/duplication testing only), KIT, MEN1, MLH1, MSH2, MSH3, MSH6, MUTYH, NBN, NF1, NHTL1, PALB2, PDGFRA, PMS2, POLD1, POLE, PTEN, RAD50, RAD51C, RAD51D, RNF43, SDHB, SDHC, SDHD, SMAD4, SMARCA4. STK11, TP53, TSC1, TSC2, and VHL.  The following genes were evaluated for sequence changes only: SDHA and HOXB13 c.251G>A variant only.  CURRENT THERAPY: Anastrozole , Fulvestrant , Palbociclib   INTERVAL HISTORY:  Discussed the use of AI scribe software for clinical note transcription with the patient, who gave verbal consent to proceed.  History of Present Illness Stacey [name] is an 83 year old female with breast cancer who  presents for follow-up regarding a recent biopsy of a left supraclavicular lymph node.   She has more good days than bad days recently, although her energy levels remain unchanged and are closely tied to her breathing difficulties, which slow her down.  She is currently taking Ibrance  and anastrozole . She restarted Ibrance  in January after a two and a half month hiatus due to hospitalization for sepsis and pneumonia. During this period, she was unable to take Ibrance , which she associates with the development of the current lymph node issue.  A biopsy of the left supraclavicular lymph node was performed last week at [facility]. The biopsy showed malignancy, and she was informed that it was related to her breast cancer history. The estrogen, progesterone, and HER2 status of the cancer are still pending.  She recalls that the left supraclavicular lymph node was initially too small to biopsy and required a three-month wait before it could be biopsied. No other lumps or changes have been noticed elsewhere in her body.  Her recent CT scan without contrast showed that the lymph node had doubled in size over three months, from five millimeters to eleven millimeters, but did not reveal any other abnormalities. She has not had a PET scan since April.  She lives alone and emphasizes the importance of planning ahead due to her living situation. She is proactive in managing her health, often advocating for herself and ensuring follow-up on medical issues.    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   DVT (deep venous thrombosis) (HCC) 12/04/2022   Diarrhea 12/04/2022   Sepsis (HCC) 11/17/2022   History of DVT (deep vein thrombosis) 11/17/2022   Current chronic use of systemic steroids 11/17/2022   Tachypnea 11/17/2022   Pulmonary nodule 11/17/2022   Acute deep vein thrombosis (DVT) of femoral vein of right lower extremity (HCC) 04/13/2022   Nasal dryness 02/04/2021   Chronic sinusitis 11/25/2020   Allergic rhinitis 07/08/2020   Hypomagnesemia 06/10/2020   Goals of care, counseling/discussion 06/21/2019   Family history of breast cancer    Family  history of pancreatic cancer    Family history of uterine cancer    Family history of lymphoma    Family history of lung cancer    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HCC) 06/02/2019   Recurrent cancer of left breast (HCC) 05/15/2019   Pes anserine bursitis 03/31/2019   HTN (hypertension) 02/10/2019   Hyperglycemia 06/21/2018   Left carpal tunnel syndrome 02/22/2018   Rotator cuff arthropathy of left shoulder 09/20/2017   Arthritis of hand 08/23/2017   Pain in right hand 08/23/2017   Cervical radiculitis 04/09/2017   Left shoulder pain 04/09/2017   DOE (dyspnea on exertion) 05/14/2016   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   Lymphedema of left arm 01/14/2016   Left arm swelling 12/25/2015   SVT (supraventricular tachycardia) (HCC)    Left-sided carotid artery disease (HCC) 03/08/2015   Dizziness 01/24/2015   Urinary frequency 01/13/2015   Dizziness and giddiness 01/09/2015   Gallstones 02/21/2013   Malignant neoplasm of upper-inner quadrant of left breast in female, estrogen receptor positive (HCC) 11/28/2012   Muscle cramping 09/12/2012   Right hip pain 09/12/2012   Lower back pain 09/12/2012   COPD GOLD I     Hx of radiation therapy     Right shoulder pain 09/14/2011   Preventative health care 07/29/2010   Skin lesion of left leg 07/29/2010   Paresthesia 07/29/2010   GERD 03/28/2010   Constipation 03/28/2010   Osteoarthrosis, 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   Anxiety state 05/03/2009   Hyperlipidemia 06/14/2007   FATIGUE 06/14/2007   Anemia 12/17/2006   Diverticulosis of colon 12/17/2006   Cough 12/17/2006   IRRITABLE BOWEL SYNDROME, HX OF 12/17/2006    is allergic to other, tape, aminoglycosides, bacitracin, bee venom, cephalexin , clindamycin/lincomycin, codeine, and nasacort  [triamcinolone ].  MEDICAL HISTORY: Past Medical History:  Diagnosis Date   ANEMIA-NOS    ANXIETY    Blood transfusion without reported diagnosis Nov. 2024   Breast cancer (HCC) 1997 L, 2012 R   s/p chemo/xrt   Cataract 2015   CHF (congestive heart failure) (HCC) Nov. 2024   COPD    resolved   DIVERTICULOSIS, COLON 2008   Dizziness    Emphysema of lung (HCC) NA   Family history of breast cancer    Family history of lung cancer    Family history of lymphoma    Family history of pancreatic cancer    Family history of uterine cancer    GERD    Hx of radiation therapy 10/19/11 -12/03/11   right breast   HYPERLIPIDEMIA    IRRITABLE BOWEL SYNDROME, HX OF    Left-sided carotid artery disease (HCC)    moderate left ICA stenosis   OSTEOARTHRITIS, HAND    PSVT (paroxysmal supraventricular tachycardia) (HCC)    symptomatic on event monitor    SURGICAL HISTORY: Past Surgical History:  Procedure Laterality Date   ABDOMINAL HYSTERECTOMY     APPENDECTOMY     BREAST BIOPSY  11/14/2010   r breast: inv, insitu mammary carcinoma w/calcif, er/pr +, her2 -   BREAST SURGERY     lumpectomy   CATARACT EXTRACTION     both eyes   COSMETIC SURGERY  1989   ELECTROPHYSIOLOGIC STUDY N/A 05/10/2015   Procedure: SVT Ablation;  Surgeon: Will Gladis Norton, MD;  Location: [facility] INVASIVE CV LAB;  Service: Cardiovascular;  Laterality: N/A;   HERNIA REPAIR      inguinal herniorrhapy  02/02/1982   left   IR US  GUIDE BX ASP/DRAIN  05/26/2019   rectal fissure repair     s/p benign breast biopsy  02/02/2001   right   s/p left foot surgury  02/03/2007   s/p lumpectomy  02/03/1995   melignant left x 2   spiral fx left foot  02/02/2006   no surgury   TMJ ARTHROPLASTY  02/03/1987   TONGUE SURGERY     1988- to remove scar tissue growth    TONSILLECTOMY      SOCIAL HISTORY: Social History   Socioeconomic History   Marital status: Divorced    Spouse name: 2 Step-children   Number of children: 2   Years of education: Not on file   Highest education level: Bachelor's degree (e.g., BA, AB, BS)  Occupational History   Occupation: retired Training and development officer  Tobacco Use   Smoking status: Former    Current packs/day: 0.00    Average packs/day: 1 pack/day for 54.0 years (54.0 ttl pk-yrs)    Types: Cigarettes    Start date: 01/02/1962    Quit date: 01/03/2016    Years since quitting: 7.7   Smokeless tobacco: Never  Vaping Use   Vaping status: Never Used  Substance and Sexual Activity   Alcohol use: Yes    Comment: rare/ drinks socially   Drug use: No   Sexual activity: Never  Other Topics Concern   Not on file  Social History Narrative   Patient gets no regular exercise   No biological children   2 step children   Social Drivers of Corporate investment banker Strain: Low Risk  (08/06/2023)   Overall Financial Resource Strain (CARDIA)    Difficulty of Paying Living Expenses: Not hard at all  Food Insecurity: No Food Insecurity (08/06/2023)   Hunger Vital Sign    Worried About Running Out of Food in the Last Year: Never true    Ran Out of Food in the Last Year: Never true  Transportation Needs: No Transportation Needs (08/06/2023)   PRAPARE - Administrator, Civil Service (Medical): No    Lack of Transportation (Non-Medical): No  Physical Activity: Inactive (08/06/2023)   Exercise Vital Sign    Days of Exercise per Week: 0 days     Minutes of Exercise per Session: Not on file  Stress: No Stress Concern Present (08/06/2023)   [employer] of Occupational Health - Occupational Stress Questionnaire    Feeling of Stress: Not at all  Social Connections: Socially Isolated (08/06/2023)   Social Connection and Isolation Panel    Frequency of Communication with Friends and Family: More than three times a week    Frequency of Social Gatherings with Friends and Family: Patient declined    Attends Religious Services: Never    Database administrator or Organizations: No    Attends Engineer, structural: Not on file    Marital Status: Divorced  Intimate Partner Violence: Not At Risk (01/11/2023)   Humiliation, Afraid, Rape, and Kick questionnaire    Fear of Current or Ex-Partner: No    Emotionally Abused: No    Physically Abused: No    Sexually Abused: No    FAMILY HISTORY: Family History  Problem Relation Age of Onset   Hypertension Mother    Stroke Mother    Colon polyps Mother    Diabetes Mother    Pancreatic cancer Mother 50   Uterine cancer Mother 47   Cancer Mother    Lymphoma Brother        burkitts  Cancer Brother    Lung cancer Paternal Uncle    Lung cancer Maternal Grandmother 14       non-smoker   Lung cancer Maternal Grandfather    Breast cancer Cousin        maternal cousin, dx in her mid 69s   Brain cancer Cousin        maternal cousin's son; dx in his 56s   Testicular cancer Cousin        maternal cousin's son;    Breast cancer Cousin        paternal cousin; dx in her 96s   Alcohol abuse Father    Breast cancer Cousin        paternal cousin's daughter; dx in 63s; neg genetic testing   Asthma Brother    Asthma Brother    Colon cancer Neg Hx    Esophageal cancer Neg Hx    Stomach cancer Neg Hx    Rectal cancer Neg Hx     Review of Systems  Constitutional:  Positive for fatigue. Negative for appetite change, chills, fever and unexpected weight change.  HENT:   Negative for  hearing loss, lump/mass and trouble swallowing.   Eyes:  Negative for eye problems and icterus.  Respiratory:  Negative for chest tightness, cough and shortness of breath.   Cardiovascular:  Negative for chest pain, leg swelling and palpitations.  Gastrointestinal:  Negative for abdominal distention, abdominal pain, constipation, diarrhea, nausea and vomiting.  Endocrine: Negative for hot flashes.  Genitourinary:  Negative for difficulty urinating.   Musculoskeletal:  Negative for arthralgias.  Skin:  Negative for itching and rash.  Neurological:  Negative for dizziness, extremity weakness, headaches and numbness.  Hematological:  Negative for adenopathy. Does not bruise/bleed easily.  Psychiatric/Behavioral:  Negative for depression. The patient is not nervous/anxious.       PHYSICAL EXAMINATION   Onc Performance Status - 09/15/23 1200       KPS SCALE   KPS % SCORE Normal activity with effort, some s/s of disease          Vitals:   09/15/23 1214  BP: (!) 142/61  Pulse: 79  Resp: 16  Temp: 98 F (36.7 C)  SpO2: 97%    She appears well, no acute distress  LABORATORY DATA:  CBC    Component Value Date/Time   WBC 2.7 (L) 09/15/2023 1130   WBC 3.6 (L) 06/23/2023 1530   RBC 3.06 (L) 09/15/2023 1130   HGB 10.4 (L) 09/15/2023 1130   HGB 12.4 12/09/2015 1408   HCT 30.8 (L) 09/15/2023 1130   HCT 25.8 (L) 07/03/2022 1345   HCT 36.2 12/09/2015 1408   PLT 184 09/15/2023 1130   PLT 238 12/09/2015 1408   MCV 100.7 (H) 09/15/2023 1130   MCV 89.8 12/09/2015 1408   MCH 34.0 09/15/2023 1130   MCHC 33.8 09/15/2023 1130   RDW 14.6 09/15/2023 1130   RDW 13.2 12/09/2015 1408   LYMPHSABS 0.6 (L) 09/15/2023 1130   LYMPHSABS 2.1 12/09/2015 1408   MONOABS 0.3 09/15/2023 1130   MONOABS 0.5 12/09/2015 1408   EOSABS 0.1 09/15/2023 1130   EOSABS 0.2 12/09/2015 1408   BASOSABS 0.0 09/15/2023 1130   BASOSABS 0.0 12/09/2015 1408    CMP     Component Value Date/Time   NA 140  09/15/2023 1130   NA 140 12/09/2015 1409   K 4.0 09/15/2023 1130   K 4.2 12/09/2015 1409   CL 108 09/15/2023 1130   CL 107 02/02/2012  0939   CO2 22 09/15/2023 1130   CO2 25 12/09/2015 1409   GLUCOSE 109 (H) 09/15/2023 1130   GLUCOSE 102 12/09/2015 1409   GLUCOSE 104 (H) 02/02/2012 0939   BUN 31 (H) 09/15/2023 1130   BUN 16.7 12/09/2015 1409   CREATININE 1.66 (H) 09/15/2023 1130   CREATININE 0.8 12/09/2015 1409   CALCIUM  9.5 09/15/2023 1130   CALCIUM  9.4 12/09/2015 1409   PROT 7.6 09/15/2023 1130   PROT 7.0 12/09/2015 1409   ALBUMIN 4.5 09/15/2023 1130   ALBUMIN 3.3 (L) 12/09/2015 1409   AST 19 09/15/2023 1130   AST 20 12/09/2015 1409   ALT 10 09/15/2023 1130   ALT 13 12/09/2015 1409   ALKPHOS 57 09/15/2023 1130   ALKPHOS 54 12/09/2015 1409   BILITOT 0.4 09/15/2023 1130   BILITOT <0.22 12/09/2015 1409   GFRNONAA 30 (L) 09/15/2023 1130   GFRAA >60 10/26/2019 1347   GFRAA >60 05/09/2019 1303       ASSESSMENT and THERAPY PLAN:   Assessment and Plan Assessment & Plan Metastatic breast cancer with left supraclavicular lymph node involvement Biopsy confirmed malignancy consistent with breast cancer. Estrogen, progesterone, and HER2 status pending. Off Ibrance  for two and a half months due to hospitalization, possibly contributing to lymph node activity. PET scan showed no other significant findings. - Continue Ibrance  and anastrozole  with faslodex  considering previous estrogen-positive status. - Consult radiation oncologist for potential radiation therapy to left supraclavicular lymph node given only one site of disease activity. - Order [facility] testing on lymph node biopsy for potential tumor mutations for future therapy guidance. - Monitor with PET scan in November. - Monitor tumor markers (CA 27.29) monthly.  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 Increasing Ibrance  dose to 100 mg  is not preferred due to previous severity of diarrhea. - Continue current dose of Ibrance  at 75 mg. - Monitor diarrhea symptoms  Chronic kidney disease, unspecified stage Chronic kidney disease affects ability to use contrast in imaging studies, limiting effectiveness of CT scans without contrast for detecting lymph nodes. - Avoid CT scans with contrast due to kidney disease.    All questions were answered. The patient knows to call the clinic with any problems, questions or concerns. We can certainly see the patient much sooner if necessary.  Total encounter time:40 minutes*in face-to-face visit time, chart review, lab review, care coordination, order entry, and documentation of the encounter time.   *Total Encounter Time as defined by the Centers for Medicare and Medicaid Services includes, in addition to the face-to-face time of a patient visit (documented in the note above) non-face-to-face time: obtaining and reviewing outside history, ordering and reviewing medications, tests or procedures, care coordination (communications with other health care professionals or caregivers) and documentation in the medical record.

## 2023-09-17 ENCOUNTER — Other Ambulatory Visit: Payer: Self-pay | Admitting: Hematology and Oncology

## 2023-09-17 ENCOUNTER — Other Ambulatory Visit: Payer: Self-pay

## 2023-09-17 MED ORDER — PALBOCICLIB 75 MG PO TABS
75.0000 mg | ORAL_TABLET | Freq: Every day | ORAL | 3 refills | Status: AC
  Filled 2023-09-17: qty 21, 21d supply, fill #0
  Filled 2023-10-14 – 2023-11-15 (×2): qty 21, 28d supply, fill #0
  Filled 2023-12-07: qty 21, 28d supply, fill #1
  Filled 2024-01-03 – 2024-01-21 (×2): qty 21, 28d supply, fill #2
  Filled 2024-02-29: qty 21, 28d supply, fill #3

## 2023-09-20 LAB — SURGICAL PATHOLOGY

## 2023-09-20 NOTE — Progress Notes (Signed)
 Radiation Oncology         [phone] ________________________________  Initial outpatient Re-Consultation  Name: Stacey [name] MRN: [id number]  Date: 09/21/2023  DOB: 1940-10-17  RR:Gnyw, Lynwood ORN, MD  Loretha Ash, MD   REFERRING PHYSICIAN: Loretha Ash, MD  DIAGNOSIS: No diagnosis found. 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 female with a history of left breast cancer (stage IIB T2, N1b, invasive ductal) diagnosed in 11/1995 and right breast cancer (stage IIB ypT2, ypN1a, invasive lobular) diagnosed in 11/2010. S/p radiation by Dr. Jadine and Dr. Patrcia.    Patient  is known to me for her history of radiation for metastatic lymph nodes in April of 2021. She is currently on Ibrance  and anastrozole  with faslodex  managed by Dr. Loretha.   She underwent a monitoring CT chest on 07/29/23 which showed a redemonstration of an  enlarged left supraclavicular lymph node measuring 11 x 9 mm, compared to previously 5 mm on PET C done on 04/16/23 T. No additional suspicious mediastinal or axillary nodes were indicated on scan.  Subsequently, she then underwent a US  guided biopsy of lymph node on 09/08/23 to determine further recommendations. Morphology is compatible with metastatic breast carcinoma. The tumor cells are negative for Her2 (1+) ; Estrogen Receptor:  20%,  positive, weak staining intensity; Progesterone Receptor:  50%, positive, moderate to strong staining intensity; Proliferation Marker Ki67: 10%.         During her most recent follow up with Dr. Loretha on 09/15/23, he recommended patient undergo radiation therapy to left supraclavicular lymph node and continuing current chemotherapy regimen.    PREVIOUS RADIATION THERAPY: Yes *** 10/19/11 - 12/03/11: Right Breast (Dr. Patrcia) 1997: Left Breast (Dr. Jadine) Intent: Palliative Radiation Treatment Dates: 06/14/2019 through 06/30/2019 Site Technique Total Dose (Gy) Dose per Fx (Gy) Completed Fx Beam Energies  Scapula, Left: Chest_Lt_scap_ax IMRT 32.5/32.5 2.5 13/13 6X   PAST MEDICAL HISTORY:  has a past medical history of ANEMIA-NOS, ANXIETY, Blood transfusion without reported diagnosis (Nov. 2024), Breast cancer (HCC) (1997 L, 2012 R), Cataract (2015), CHF (congestive heart failure) (HCC) (Nov. 2024), COPD, DIVERTICULOSIS, COLON (2008), Dizziness, Emphysema of lung (HCC) (NA), Family history of breast cancer, Family history of lung cancer, Family history of lymphoma, Family history of pancreatic cancer, Family history of uterine cancer, GERD, radiation therapy (10/19/11 -12/03/11), HYPERLIPIDEMIA, IRRITABLE BOWEL SYNDROME, HX OF, Left-sided carotid artery disease (HCC), OSTEOARTHRITIS, HAND, and PSVT (paroxysmal supraventricular tachycardia) (HCC).    PAST SURGICAL HISTORY: Past Surgical History:  Procedure Laterality Date   ABDOMINAL HYSTERECTOMY     APPENDECTOMY     BREAST BIOPSY  11/14/2010   r breast: inv, insitu mammary carcinoma w/calcif, er/pr +, her2 -   BREAST SURGERY     lumpectomy   CATARACT EXTRACTION     both eyes   COSMETIC SURGERY  1989   ELECTROPHYSIOLOGIC STUDY N/A 05/10/2015   Procedure: SVT Ablation;  Surgeon: Will Gladis Norton, MD;  Location: [facility];  Service: Cardiovascular;  Laterality: N/A;   HERNIA REPAIR     inguinal herniorrhapy  02/02/1982   left    IR US  GUIDE BX ASP/DRAIN  05/26/2019   rectal fissure repair     s/p benign breast biopsy  02/02/2001   right   s/p left foot surgury  02/03/2007   s/p lumpectomy  02/03/1995   melignant left x 2   spiral fx left foot  02/02/2006   no surgury   TMJ ARTHROPLASTY  02/03/1987   TONGUE SURGERY     1988- to remove scar tissue growth    TONSILLECTOMY      FAMILY HISTORY: family history includes Alcohol abuse in her father; Asthma in her brother and brother; Brain cancer in her cousin; Breast cancer in her cousin, cousin, and cousin; Cancer in her brother and mother; Colon polyps in her mother; Diabetes in her mother; Hypertension in her mother; Lung cancer in her maternal grandfather and paternal uncle; Lung cancer (age of onset: 65) in her maternal grandmother; Lymphoma in her brother; Pancreatic cancer (age of onset: 81) in her mother; Stroke in her mother; Testicular cancer in her cousin; Uterine cancer (age of onset: 62) in her mother.  SOCIAL HISTORY:  reports that she quit smoking about 7 years ago. Her smoking use included cigarettes. She started smoking about 61 years ago. She has a 54 pack-year smoking history. She has never used smokeless tobacco. She reports current alcohol use. She reports that she does not use drugs.  ALLERGIES: Other, Tape, Aminoglycosides, Bacitracin, Bee venom, Cephalexin , Clindamycin/lincomycin, Codeine, and Nasacort  [triamcinolone ]  MEDICATIONS:  Current Outpatient Medications  Medication Sig Dispense Refill   acetaminophen  (TYLENOL ) 500 MG tablet Take 1,000 mg by mouth every 8 (eight) hours as needed for mild pain (pain score 1-3) or moderate pain (pain score 4-6).     anastrozole  (ARIMIDEX ) 1 MG tablet TAKE 1 TABLET(1 MG) BY MOUTH DAILY 90 tablet 0   bimatoprost (LUMIGAN) 0.01 % SOLN 1 drop at bedtime.     CALCIUM  CARBONATE-VITAMIN D  PO Take 1 tablet by mouth 2 (two) times daily.     cholecalciferol (VITAMIN D3) 25 MCG (1000 UNIT) tablet Take 1,000 Units by  mouth daily.     dorzolamide (TRUSOPT) 2 % ophthalmic solution Place 1 drop into the left eye 2 (two) times daily.     ELIQUIS  5 MG TABS tablet TAKE 1 TABLET(5 MG) BY MOUTH TWICE DAILY 90 tablet 1   furosemide  (LASIX ) 40 MG tablet Take 1 tablet (40 mg total) by mouth daily as needed for up to 3 days for fluid or edema (dyspnea, weight gain of 3 lbs in 24 hours). (Patient not taking: Reported on 09/15/2023) 10 tablet 0   loperamide  (IMODIUM  A-D) 2 MG tablet Take 2 mg by mouth 4 (four) times daily as needed for diarrhea or loose stools.     loratadine  (CLARITIN ) 10 MG tablet Take 10 mg by mouth every morning.     Multiple Vitamins-Minerals (MULTIVITAMIN WITH MINERALS) tablet Take 1 tablet by mouth in the morning.     omeprazole  (PRILOSEC) 40 MG capsule Take 1 capsule (40 mg total) by mouth daily. 90 capsule 3   palbociclib  (IBRANCE ) 75 MG tablet Take 1 tablet (75 mg total) by mouth daily. Take for 21 days on, 7 days off, repeat every 28 days. 21 tablet 3   vitamin B-12 (CYANOCOBALAMIN ) 100 MCG tablet Take 100 mcg by mouth  daily.     zolpidem  (AMBIEN ) 10 MG tablet TAKE 1 TABLET(10 MG) BY MOUTH AT BEDTIME AS NEEDED FOR SLEEP 90 tablet 1   No current facility-administered medications for this encounter.    REVIEW OF SYSTEMS: As above in HPI.   PHYSICAL EXAM:  vitals were not taken for this visit.   General: Alert and oriented, in no acute distress HEENT: Head is normocephalic. Extraocular movements are intact.  Heart: Regular in rate and rhythm with no murmurs, rubs, or gallops. Chest: Clear to auscultation bilaterally, with no rhonchi, wheezes, or rales. Abdomen: Soft, nontender, nondistended, with no rigidity or guarding. Extremities: No cyanosis or edema. Skin: No concerning lesions. Musculoskeletal: symmetric strength and muscle tone throughout. Neurologic: Cranial nerves II through XII are grossly intact. No obvious focalities. Speech is fluent. Coordination is intact. Psychiatric: Judgment  and insight are intact. Affect is appropriate. Breasts: *** . No other palpable masses appreciated in the breasts or axillae *** .    ECOG = ***  0 - Asymptomatic (Fully active, able to carry on all predisease activities without restriction)  1 - Symptomatic but completely ambulatory (Restricted in physically strenuous activity but ambulatory and able to carry out work of a light or sedentary nature. For example, light housework, office work)  2 - Symptomatic, <50% in bed during the day (Ambulatory and capable of all self care but unable to carry out any work activities. Up and about more than 50% of waking hours)  3 - Symptomatic, >50% in bed, but not bedbound (Capable of only limited self-care, confined to bed or chair 50% or more of waking hours)  4 - Bedbound (Completely disabled. Cannot carry on any self-care. Totally confined to bed or chair)  5 - Death   Raylene MM, Creech RH, Tormey DC, et al. [phone]). Toxicity and response criteria of the [facility]. Am. DOROTHA Bridges. Oncol. 5 (6): 649-55   LABORATORY DATA:   CBC    Component Value Date/Time   WBC 2.7 (L) 09/15/2023 1130   WBC 3.6 (L) 06/23/2023 1530   RBC 3.06 (L) 09/15/2023 1130   HGB 10.4 (L) 09/15/2023 1130   HGB 12.4 12/09/2015 1408   HCT 30.8 (L) 09/15/2023 1130   HCT 25.8 (L) 07/03/2022 1345   HCT 36.2 12/09/2015 1408   PLT 184 09/15/2023 1130   PLT 238 12/09/2015 1408   MCV 100.7 (H) 09/15/2023 1130   MCV 89.8 12/09/2015 1408   MCH 34.0 09/15/2023 1130   MCHC 33.8 09/15/2023 1130   RDW 14.6 09/15/2023 1130   RDW 13.2 12/09/2015 1408   LYMPHSABS 0.6 (L) 09/15/2023 1130   LYMPHSABS 2.1 12/09/2015 1408   MONOABS 0.3 09/15/2023 1130   MONOABS 0.5 12/09/2015 1408   EOSABS 0.1 09/15/2023 1130   EOSABS 0.2 12/09/2015 1408   BASOSABS 0.0 09/15/2023 1130   BASOSABS 0.0 12/09/2015 1408    CMP     Component Value Date/Time   NA 140 09/15/2023 1130   NA 140 12/09/2015 1409   K 4.0  09/15/2023 1130   K 4.2 12/09/2015 1409   CL 108 09/15/2023 1130   CL 107 02/02/2012 0939   CO2 22 09/15/2023 1130   CO2 25 12/09/2015 1409   GLUCOSE 109 (H) 09/15/2023 1130   GLUCOSE 102 12/09/2015 1409   GLUCOSE 104 (H) 02/02/2012 0939   BUN 31 (H) 09/15/2023 1130   BUN 16.7 12/09/2015 1409   CREATININE 1.66 (H) 09/15/2023 1130   CREATININE 0.8 12/09/2015 1409  CALCIUM  9.5 09/15/2023 1130   CALCIUM  9.4 12/09/2015 1409   PROT 7.6 09/15/2023 1130   PROT 7.0 12/09/2015 1409   ALBUMIN 4.5 09/15/2023 1130   ALBUMIN 3.3 (L) 12/09/2015 1409   AST 19 09/15/2023 1130   AST 20 12/09/2015 1409   ALT 10 09/15/2023 1130   ALT 13 12/09/2015 1409   ALKPHOS 57 09/15/2023 1130   ALKPHOS 54 12/09/2015 1409   BILITOT 0.4 09/15/2023 1130   BILITOT <0.22 12/09/2015 1409   GFR 56.85 (L) 07/13/2018 0938   EGFR 69 (L) 12/09/2015 1409   GFRNONAA 30 (L) 09/15/2023 1130      RADIOGRAPHY: US  CORE BIOPSY (LYMPH NODES) Result Date: 09/08/2023 INDICATION: Breast carcinoma.  Left supraclavicular hypermetabolic adenopathy. EXAM: ULTRASOUND GUIDED CORE BIOPSY OF LEFT SUPRACLAVICULAR ADENOPATHY MEDICATIONS: No periprocedural antibiotics were indicated. ANESTHESIA/SEDATION: Lidocaine  1% subcutaneous PROCEDURE: The procedure, risks, benefits, and alternatives were explained to the patient. Questions regarding the procedure were encouraged and answered. The patient understands and consents to the procedure. Survey ultrasound of the left neck was performed. The supraclavicular adenopathy was localized and an appropriate site was determined and marked. The operative field was prepped with chlorhexidine  in a sterile fashion, and a sterile drape was applied covering the operative field. A sterile gown and sterile gloves were used for the procedure. Local anesthesia was provided with 1% Lidocaine . Under real-time ultrasound guidance, a 17 gauge trocar needle was advanced to the margin of the lesion. Once needle tip  position was confirmed, coaxial 18-gauge core biopsy samples were obtained, submitted in saline to surgical pathology. The guide needle was removed. Postprocedure scans show no hemorrhage or other apparent complication. COMPLICATIONS: None immediate. FINDINGS: Low-attenuation left supraclavicular adenopathy with irregular borders. Representative core biopsy samples obtained. IMPRESSION: 1. Technically successful ultrasound-guided core biopsy, left supraclavicular adenopathy. Electronically Signed   By: JONETTA Faes M.D.   On: 09/08/2023 15:28      IMPRESSION/PLAN: ***   It was a pleasure meeting the patient today. We discussed the risks, benefits, and side effects of radiotherapy. I recommend radiotherapy to the *** to reduce her risk of locoregional recurrence by 2/3.  We discussed that radiation would take approximately *** weeks to complete and that I would give the patient a few weeks to heal following surgery before starting treatment planning. *** If chemotherapy were to be given, this would precede radiotherapy. We spoke about acute effects including skin irritation and fatigue as well as much less common late effects including internal organ injury or irritation. We spoke about the latest technology that is used to minimize the risk of late effects for patients undergoing radiotherapy to the breast or chest wall. No guarantees of treatment were given. The patient is enthusiastic about proceeding with treatment. I look forward to participating in the patient's care.  I will await her referral back to me for postoperative follow-up and eventual CT simulation/treatment planning.  On date of service, in total, I spent *** minutes on this encounter. Patient was seen in person.   __________________________________________   Lauraine Golden, MD  This document serves as a record of services personally performed by Lauraine Golden, MD. It was created on her behalf by Reymundo Cartwright, a trained medical scribe. The  creation of this record is based on the scribe's personal observations and the provider's statements to them. This document has been checked and approved by the attending provider.

## 2023-09-21 ENCOUNTER — Ambulatory Visit
Admission: RE | Admit: 2023-09-21 | Discharge: 2023-09-21 | Disposition: A | Discharge status: Home / Self Care | Source: Ambulatory Visit | Attending: Radiation Oncology | Admitting: Radiation Oncology

## 2023-09-21 ENCOUNTER — Ambulatory Visit
Admission: RE | Admit: 2023-09-21 | Discharge: 2023-09-21 | Discharge status: Home / Self Care | Source: Ambulatory Visit | Attending: Radiation Oncology | Admitting: Radiation Oncology

## 2023-09-21 ENCOUNTER — Encounter: Payer: Self-pay | Admitting: Radiation Oncology

## 2023-09-21 VITALS — BP 156/72 | HR 81 | Temp 97.9°F | Resp 20 | Ht 60.3 in | Wt 139.6 lb

## 2023-09-21 DIAGNOSIS — Z170 Estrogen receptor positive status [ER+]: Secondary | ICD-10-CM

## 2023-09-21 DIAGNOSIS — Z79899 Other long term (current) drug therapy: Secondary | ICD-10-CM | POA: Diagnosis not present

## 2023-09-21 DIAGNOSIS — I509 Heart failure, unspecified: Secondary | ICD-10-CM | POA: Diagnosis not present

## 2023-09-21 DIAGNOSIS — Z7901 Long term (current) use of anticoagulants: Secondary | ICD-10-CM | POA: Insufficient documentation

## 2023-09-21 DIAGNOSIS — I471 Supraventricular tachycardia, unspecified: Secondary | ICD-10-CM | POA: Diagnosis not present

## 2023-09-21 DIAGNOSIS — K589 Irritable bowel syndrome without diarrhea: Secondary | ICD-10-CM | POA: Insufficient documentation

## 2023-09-21 DIAGNOSIS — E785 Hyperlipidemia, unspecified: Secondary | ICD-10-CM | POA: Diagnosis not present

## 2023-09-21 DIAGNOSIS — Z800 Family history of malignant neoplasm of digestive organs: Secondary | ICD-10-CM | POA: Insufficient documentation

## 2023-09-21 DIAGNOSIS — C50212 Malignant neoplasm of upper-inner quadrant of left female breast: Secondary | ICD-10-CM | POA: Diagnosis not present

## 2023-09-21 DIAGNOSIS — Z79811 Long term (current) use of aromatase inhibitors: Secondary | ICD-10-CM | POA: Insufficient documentation

## 2023-09-21 DIAGNOSIS — Z87891 Personal history of nicotine dependence: Secondary | ICD-10-CM | POA: Diagnosis not present

## 2023-09-21 DIAGNOSIS — Z807 Family history of other malignant neoplasms of lymphoid, hematopoietic and related tissues: Secondary | ICD-10-CM | POA: Diagnosis not present

## 2023-09-21 DIAGNOSIS — Z801 Family history of malignant neoplasm of trachea, bronchus and lung: Secondary | ICD-10-CM | POA: Diagnosis not present

## 2023-09-21 DIAGNOSIS — M1990 Unspecified osteoarthritis, unspecified site: Secondary | ICD-10-CM | POA: Diagnosis not present

## 2023-09-21 DIAGNOSIS — K219 Gastro-esophageal reflux disease without esophagitis: Secondary | ICD-10-CM | POA: Insufficient documentation

## 2023-09-21 DIAGNOSIS — C7951 Secondary malignant neoplasm of bone: Secondary | ICD-10-CM | POA: Diagnosis not present

## 2023-09-21 DIAGNOSIS — Z90710 Acquired absence of both cervix and uterus: Secondary | ICD-10-CM | POA: Diagnosis not present

## 2023-09-21 DIAGNOSIS — Z803 Family history of malignant neoplasm of breast: Secondary | ICD-10-CM | POA: Insufficient documentation

## 2023-09-21 DIAGNOSIS — Z923 Personal history of irradiation: Secondary | ICD-10-CM | POA: Insufficient documentation

## 2023-09-21 DIAGNOSIS — I2510 Atherosclerotic heart disease of native coronary artery without angina pectoris: Secondary | ICD-10-CM | POA: Diagnosis not present

## 2023-09-21 DIAGNOSIS — J439 Emphysema, unspecified: Secondary | ICD-10-CM | POA: Diagnosis not present

## 2023-09-21 DIAGNOSIS — D649 Anemia, unspecified: Secondary | ICD-10-CM | POA: Diagnosis not present

## 2023-09-21 DIAGNOSIS — C770 Secondary and unspecified malignant neoplasm of lymph nodes of head, face and neck: Secondary | ICD-10-CM

## 2023-09-21 NOTE — Progress Notes (Signed)
 Location of Breast Cancer: Malignant Neoplasm of Upper Inner Quadrant of Left Breast  Histology per Pathology Report:    Receptor Status: ER(20%), PR (50%), Her2-neu (1+), Ki-67(10%)  Did patient present with symptoms (if so, please note symptoms) or was this found on screening mammography?:  Patient discovered lump during her annual physical.   Past/Anticipated interventions by surgeon, if any:  Left breast lumpectomy 1997  Past/Anticipated interventions by medical oncology, if any:   Iruku, MD  Lymphedema issues, if any:  Yes, left arm is has   Pain issues, if any:   Sometimes experiences pain on left arm.  SAFETY ISSUES: Prior radiation? Yes Pacemaker/ICD? None Possible current pregnancy?None Is the patient on methotrexate? None  Current Complaints / other details: None BP (!) 156/72 (BP Location: Right Arm, Patient Position: Sitting, Cuff Size: Large)   Pulse 81   Temp 97.9 F (36.6 C)   Resp 20   Ht 5' 0.3 (1.532 m)   Wt 139 lb 9.6 oz (63.3 kg)   SpO2 94%   BMI 26.99 kg/m   Wt Readings from Last 3 Encounters:  09/21/23 139 lb 9.6 oz (63.3 kg)  09/15/23 137 lb 12.8 oz (62.5 kg)  08/10/23 138 lb 12.8 oz (63 kg)

## 2023-09-23 DIAGNOSIS — C50212 Malignant neoplasm of upper-inner quadrant of left female breast: Secondary | ICD-10-CM | POA: Diagnosis not present

## 2023-09-23 DIAGNOSIS — Z170 Estrogen receptor positive status [ER+]: Secondary | ICD-10-CM | POA: Diagnosis not present

## 2023-09-23 DIAGNOSIS — C770 Secondary and unspecified malignant neoplasm of lymph nodes of head, face and neck: Secondary | ICD-10-CM | POA: Diagnosis not present

## 2023-09-24 ENCOUNTER — Encounter: Payer: Self-pay | Admitting: Radiation Oncology

## 2023-09-24 DIAGNOSIS — C770 Secondary and unspecified malignant neoplasm of lymph nodes of head, face and neck: Secondary | ICD-10-CM | POA: Insufficient documentation

## 2023-09-27 ENCOUNTER — Telehealth: Payer: Self-pay | Admitting: Hematology and Oncology

## 2023-09-27 DIAGNOSIS — C50212 Malignant neoplasm of upper-inner quadrant of left female breast: Secondary | ICD-10-CM | POA: Diagnosis not present

## 2023-09-27 NOTE — Telephone Encounter (Signed)
 Stacey [name] has been re-scheduled and she is aware of her appointment time and date.

## 2023-10-01 ENCOUNTER — Ambulatory Visit
Admission: RE | Admit: 2023-10-01 | Discharge: 2023-10-01 | Disposition: A | Discharge status: Home / Self Care | Source: Ambulatory Visit | Attending: Radiation Oncology | Admitting: Radiation Oncology

## 2023-10-01 DIAGNOSIS — C770 Secondary and unspecified malignant neoplasm of lymph nodes of head, face and neck: Secondary | ICD-10-CM | POA: Insufficient documentation

## 2023-10-01 DIAGNOSIS — C50212 Malignant neoplasm of upper-inner quadrant of left female breast: Secondary | ICD-10-CM | POA: Insufficient documentation

## 2023-10-01 DIAGNOSIS — C7951 Secondary malignant neoplasm of bone: Secondary | ICD-10-CM | POA: Diagnosis not present

## 2023-10-01 DIAGNOSIS — Z510 Encounter for antineoplastic radiation therapy: Secondary | ICD-10-CM | POA: Diagnosis not present

## 2023-10-01 DIAGNOSIS — Z170 Estrogen receptor positive status [ER+]: Secondary | ICD-10-CM | POA: Diagnosis not present

## 2023-10-04 NOTE — Progress Notes (Unsigned)
 Subjective:     Patient ID: Stacey [name], female   DOB: September 25, 1940     MRN: [id number]    Brief patient profile:  17   yowf stopped smoking 01/2016  S/p RT to both breasts last RT Oct 2012 and seemed to affect breathing/ cough and then worse summer 2017 and quit smoking the cough resolved but doe stayed same and so pfts done c/w GOLD I copd 06/22/16  So pt referred to pulmonary clinic by Dr   Norleen and first seen 08/27/2016    History of Present Illness  08/27/2016 1st [facility] office visit/ Stacey [name]   Chief Complaint  Patient presents with   Pulmonary Consult    Referred by Dr. Lynwood Norleen for eval of abnormal PFT. Pt c/o SOB for the past year, progressively getting worse. She states she tires very easily and her exercise tolerance has gone downhill.  She has rx for albuterol  inhaler, but never had it filled.   indolent onset doe x 2012 worse since summer 2017 but since then about the same  Doe MMRC1 = can walk nl pace, flat grade, can't hurry or go uphills or steps s sob   Was walking 18 min / mile prior to 23 min mile now  Taking prilosec at least one daily right before bfast for overt HB Was prescribed inhalers but never filled rx or tried one of any kind rec Spiriva  2 puffs each am x 2 week sample and if your ex tolerance is better, stay on it indefinitely  Work on inhaler technique:   You have mild copd (GOLD I)    12/01/2021  yearly  f/u ov/Stacey [name] re: GOLD 1   maint on spiriva   not sure she can tell it's helping  Dyspnea:  avg pace and problems hills =  MMRC1 = can walk nl pace, flat grade, can't hurry or go uphills or steps s sob   Cough: none  Sleeping: prone/ flat bed  SABA use: none  02: none  Rec Try stop spiriva  and start stiolto 2 puffs each am x 2 weeks to see if it improves your exercise tolerance better than spiriva  and if not ok to stop it Work on inhaler technique:    06/16/2023  f/u ov/Stacey [name] re: GOLD 1 copd  maint on no rx  with met breast ca  on Ibrance    Chief Complaint  Patient presents with   Follow-up    COPD, Pulmonary nodule, and Respiratory failure f/u   Dyspnea:  worse  doe since  0ct 2024 p  admit with pna but not progression since  HT ok pushing cart avg speed / 15 min/mile flat x 30 min  Cough: coughing more since onset of doe > minimal mucoid Sleeping: flat bed, one flat pilow  resp cc  SABA use: none 02: none Rec Pace yourself  Make sure you check your oxygen saturation at your highest level of activity(NOT after you stop)  to be sure it stays over 90%    09/08/23  Pos  Bx [place] node for adenoca   10/05/2023  f/u ov/Stacey [name] re: GOLD 1 copd  maint on no resp rx/ not monitoring sats  Chief Complaint  Patient presents with   COPD    SOB with exertion is persistent.  Dyspnea:  working in yard/can work down but having trouble walking back up  Cough: none  Sleeping: flat bed/ one pillow s resp cc  SABA use: none 02: none  No obvious day to day or daytime variability or assoc excess/ purulent sputum or mucus plugs or hemoptysis or cp or chest tightness, subjective wheeze or overt sinus or hb symptoms.    Also denies any obvious fluctuation of symptoms with weather or environmental changes or other aggravating or alleviating factors except as outlined above   No unusual exposure hx or h/o childhood pna/ asthma or knowledge of premature birth.  Current Allergies, Complete Past Medical History, Past Surgical History, Family History, and Social History were reviewed in [employer] record.  ROS  The following are not active complaints unless bolded Hoarseness, sore throat, dysphagia, dental problems, itching, sneezing,  nasal congestion or discharge of excess mucus or purulent secretions, ear ache,   fever, chills, sweats, unintended wt loss or wt gain, classically pleuritic or exertional cp,  orthopnea pnd or arm/hand swelling  or leg swelling, presyncope, palpitations, abdominal pain, anorexia, nausea,  vomiting, diarrhea  or change in bowel habits or change in bladder habits, change in stools or change in urine, dysuria, hematuria,  rash, arthralgias, visual complaints, headache, numbness, weakness or ataxia or problems with walking or coordination,  change in mood or  memory.        Current Meds  Medication Sig   acetaminophen  (TYLENOL ) 500 MG tablet Take 1,000 mg by mouth every 8 (eight) hours as needed for mild pain (pain score 1-3) or moderate pain (pain score 4-6).   anastrozole  (ARIMIDEX ) 1 MG tablet TAKE 1 TABLET(1 MG) BY MOUTH DAILY   bimatoprost (LUMIGAN) 0.01 % SOLN 1 drop at bedtime.   CALCIUM  CARBONATE-VITAMIN D  PO Take 1 tablet by mouth 2 (two) times daily.   cholecalciferol (VITAMIN D3) 25 MCG (1000 UNIT) tablet Take 1,000 Units by mouth daily.   dorzolamide (TRUSOPT) 2 % ophthalmic solution Place 1 drop into the left eye 2 (two) times daily.   ELIQUIS  5 MG TABS tablet TAKE 1 TABLET(5 MG) BY MOUTH TWICE DAILY   furosemide  (LASIX ) 40 MG tablet Take 1 tablet (40 mg total) by mouth daily as needed for up to 3 days for fluid or edema (dyspnea, weight gain of 3 lbs in 24 hours).   loperamide  (IMODIUM  A-D) 2 MG tablet Take 2 mg by mouth 4 (four) times daily as needed for diarrhea or loose stools.   loratadine  (CLARITIN ) 10 MG tablet Take 10 mg by mouth every morning.   Multiple Vitamins-Minerals (MULTIVITAMIN WITH MINERALS) tablet Take 1 tablet by mouth in the morning.   omeprazole  (PRILOSEC) 40 MG capsule Take 1 capsule (40 mg total) by mouth daily.   palbociclib  (IBRANCE ) 75 MG tablet Take 1 tablet (75 mg total) by mouth daily. Take for 21 days on, 7 days off, repeat every 28 days.   vitamin B-12 (CYANOCOBALAMIN ) 100 MCG tablet Take 100 mcg by mouth daily.   zolpidem  (AMBIEN ) 10 MG tablet TAKE 1 TABLET(10 MG) BY MOUTH AT BEDTIME AS NEEDED FOR SLEEP           Objective:   Physical Exam   wts  10/05/2023          139  06/16/2023        141  12/01/2021      142  11/25/2020       146   11/27/2019      163  12/02/2018      170 12/01/2017      171   08/27/16 157 lb (71.2 kg)  05/14/16 163 lb (73.9 kg)  01/14/16  160 lb (72.6 kg)     Vital signs reviewed  10/05/2023  - Note at rest 02 sats  97% on RA    General appearance:    amb wm    HEENT : Oropharynx  clear         NECK :  without  apparent JVD/ palpable Nodes/TM    LUNGS: no acc muscle use,  Nl contour chest which is clear to A and P bilaterally without cough on insp or exp maneuvers   CV:  RRR  no s3 or murmur or increase in P2, and no edema   ABD:  soft and nontender   MS:  Gait nl  ext warm without deformities Or obvious joint restrictions  calf tenderness, cyanosis or clubbing    SKIN: warm and dry without lesions    NEURO:  alert, approp, nl sensorium with  no motor or cerebellar deficits apparent.        Assessment:

## 2023-10-05 ENCOUNTER — Ambulatory Visit (INDEPENDENT_AMBULATORY_CARE_PROVIDER_SITE_OTHER): Admitting: Internal Medicine

## 2023-10-05 ENCOUNTER — Encounter: Payer: Self-pay | Admitting: Internal Medicine

## 2023-10-05 VITALS — BP 130/60 | HR 91 | Temp 98.3°F | Ht 63.0 in | Wt 139.8 lb

## 2023-10-05 DIAGNOSIS — Z87891 Personal history of nicotine dependence: Secondary | ICD-10-CM | POA: Diagnosis not present

## 2023-10-05 DIAGNOSIS — J449 Chronic obstructive pulmonary disease, unspecified: Secondary | ICD-10-CM | POA: Diagnosis not present

## 2023-10-05 NOTE — Patient Instructions (Addendum)
Make sure you check your oxygen saturation at your highest level of activity(NOT after you stop)  to be sure it stays over 90% and keep track of it at least once a week, more often if breathing getting worse, and let me know if losing ground. (Collect the dots to connect the dots approach)     Please schedule a follow up visit in 6 months but call sooner if needed

## 2023-10-06 NOTE — Assessment & Plan Note (Addendum)
 Quit smoking 01/2016 - PFT's  08/27/2016  FEV1 1.83 (86 % ) ratio 63  p 2 % improvement from saba p nothing prior to study with DLCO  43/45c % corrects to 60  % for alv volume  - 08/27/2016   Walked RA  2 laps @ 185 ft each stopped due to legs gave out walking fast pace, no sob or desat  - 08/27/2016  After extensive coaching device  effectiveness =    75% > try spiriva  respimat 2.5 mg x 2 pffs each am - 12/02/2018  C/o dry mouth so rec taper off spiriva  to see if affects doe or mouth and if can tolerate one puff spiriva   (but not zero due to doe)  then options are one puff spiriva  or one stiolto daily - 11/27/2019  After extensive coaching inhaler device,  effectiveness =    90% with elipta so try anoro one click each am due to mmrc2 doe and cost of spiriva   > preferred spiriva  respimat - 12/01/2021  After extensive coaching inhaler device,  effectiveness =    90% with smi > try stiolto 2 each am and if no change in ex tol  then d/c and see what absence of rx does to above indicators  -06/16/2023   Walked on RA  x  2  lap(s) =  approx 500  ft  @ slow pace, stopped due to sob  with lowest 02 sats 91%   - 10/05/2023   Walked on RA  x  1  lap(s) =  approx 250  ft  @ slow pace, slow pace. Patient able to walk one lap. Patient c/o SOB at end of Lap 1. Patient unable to walk any further laps. Patient SaO2 lowest  92% with fagtigue > doe   Her lungs are clear on exam with mostly emphysema on CT and did not appear to be sob on today's walk.  No evidence of ILD or airway limitation so rec she continue to be as active as possible and monitor sats going forward.  Each maintenance medication was reviewed in detail including emphasizing most importantly the difference between maintenance and prns and under what circumstances the prns are to be triggered using an action plan format where appropriate.  Total time for H and P, chart review, counseling,  directly observing portions of ambulatory 02 saturation study/ and  generating customized AVS unique to this office visit / same day charting = 35 min

## 2023-10-08 ENCOUNTER — Ambulatory Visit
Admission: RE | Admit: 2023-10-08 | Discharge: 2023-10-08 | Disposition: A | Discharge status: Home / Self Care | Source: Ambulatory Visit | Attending: Radiation Oncology | Admitting: Radiation Oncology

## 2023-10-08 DIAGNOSIS — C7951 Secondary malignant neoplasm of bone: Secondary | ICD-10-CM | POA: Diagnosis not present

## 2023-10-08 DIAGNOSIS — Z510 Encounter for antineoplastic radiation therapy: Secondary | ICD-10-CM | POA: Insufficient documentation

## 2023-10-08 DIAGNOSIS — C770 Secondary and unspecified malignant neoplasm of lymph nodes of head, face and neck: Secondary | ICD-10-CM | POA: Diagnosis not present

## 2023-10-08 DIAGNOSIS — Z170 Estrogen receptor positive status [ER+]: Secondary | ICD-10-CM | POA: Diagnosis not present

## 2023-10-08 DIAGNOSIS — C50212 Malignant neoplasm of upper-inner quadrant of left female breast: Secondary | ICD-10-CM | POA: Insufficient documentation

## 2023-10-11 ENCOUNTER — Other Ambulatory Visit: Payer: Self-pay

## 2023-10-11 ENCOUNTER — Ambulatory Visit
Admission: RE | Admit: 2023-10-11 | Discharge: 2023-10-11 | Disposition: A | Discharge status: Home / Self Care | Source: Ambulatory Visit | Attending: Radiation Oncology | Admitting: Radiation Oncology

## 2023-10-11 DIAGNOSIS — C770 Secondary and unspecified malignant neoplasm of lymph nodes of head, face and neck: Secondary | ICD-10-CM | POA: Diagnosis not present

## 2023-10-11 DIAGNOSIS — Z510 Encounter for antineoplastic radiation therapy: Secondary | ICD-10-CM | POA: Diagnosis not present

## 2023-10-11 DIAGNOSIS — Z170 Estrogen receptor positive status [ER+]: Secondary | ICD-10-CM | POA: Diagnosis not present

## 2023-10-11 DIAGNOSIS — C50212 Malignant neoplasm of upper-inner quadrant of left female breast: Secondary | ICD-10-CM | POA: Diagnosis not present

## 2023-10-11 DIAGNOSIS — C7951 Secondary malignant neoplasm of bone: Secondary | ICD-10-CM | POA: Diagnosis not present

## 2023-10-11 LAB — RAD ONC ARIA SESSION SUMMARY
Course Elapsed Days: 0
Plan Fractions Treated to Date: 1
Plan Prescribed Dose Per Fraction: 2.8 Gy
Plan Total Fractions Prescribed: 15
Plan Total Prescribed Dose: 42 Gy
Reference Point Dosage Given to Date: 2.8 Gy
Reference Point Session Dosage Given: 2.8 Gy
Session Number: 1

## 2023-10-12 ENCOUNTER — Ambulatory Visit
Admission: RE | Admit: 2023-10-12 | Discharge: 2023-10-12 | Disposition: A | Discharge status: Home / Self Care | Source: Ambulatory Visit | Attending: Radiation Oncology | Admitting: Radiation Oncology

## 2023-10-12 ENCOUNTER — Other Ambulatory Visit: Payer: Self-pay

## 2023-10-12 DIAGNOSIS — Z170 Estrogen receptor positive status [ER+]: Secondary | ICD-10-CM | POA: Diagnosis not present

## 2023-10-12 DIAGNOSIS — C770 Secondary and unspecified malignant neoplasm of lymph nodes of head, face and neck: Secondary | ICD-10-CM | POA: Diagnosis not present

## 2023-10-12 DIAGNOSIS — C50212 Malignant neoplasm of upper-inner quadrant of left female breast: Secondary | ICD-10-CM | POA: Diagnosis not present

## 2023-10-12 DIAGNOSIS — Z510 Encounter for antineoplastic radiation therapy: Secondary | ICD-10-CM | POA: Diagnosis not present

## 2023-10-12 DIAGNOSIS — C7951 Secondary malignant neoplasm of bone: Secondary | ICD-10-CM | POA: Diagnosis not present

## 2023-10-12 LAB — RAD ONC ARIA SESSION SUMMARY
Course Elapsed Days: 1
Plan Fractions Treated to Date: 1
Plan Prescribed Dose Per Fraction: 2.8 Gy
Plan Total Fractions Prescribed: 14
Plan Total Prescribed Dose: 39.2 Gy
Reference Point Dosage Given to Date: 5.6 Gy
Reference Point Session Dosage Given: 2.8 Gy
Session Number: 2

## 2023-10-13 ENCOUNTER — Ambulatory Visit
Admission: RE | Admit: 2023-10-13 | Discharge: 2023-10-13 | Disposition: A | Discharge status: Home / Self Care | Source: Ambulatory Visit | Attending: Radiation Oncology

## 2023-10-13 ENCOUNTER — Other Ambulatory Visit: Payer: Self-pay

## 2023-10-13 DIAGNOSIS — C7951 Secondary malignant neoplasm of bone: Secondary | ICD-10-CM | POA: Diagnosis not present

## 2023-10-13 DIAGNOSIS — C770 Secondary and unspecified malignant neoplasm of lymph nodes of head, face and neck: Secondary | ICD-10-CM | POA: Diagnosis not present

## 2023-10-13 DIAGNOSIS — Z510 Encounter for antineoplastic radiation therapy: Secondary | ICD-10-CM | POA: Diagnosis not present

## 2023-10-13 DIAGNOSIS — Z170 Estrogen receptor positive status [ER+]: Secondary | ICD-10-CM | POA: Diagnosis not present

## 2023-10-13 DIAGNOSIS — C50212 Malignant neoplasm of upper-inner quadrant of left female breast: Secondary | ICD-10-CM | POA: Diagnosis not present

## 2023-10-13 LAB — RAD ONC ARIA SESSION SUMMARY
Course Elapsed Days: 2
Plan Fractions Treated to Date: 2
Plan Prescribed Dose Per Fraction: 2.8 Gy
Plan Total Fractions Prescribed: 14
Plan Total Prescribed Dose: 39.2 Gy
Reference Point Dosage Given to Date: 8.4 Gy
Reference Point Session Dosage Given: 2.8 Gy
Session Number: 3

## 2023-10-14 ENCOUNTER — Other Ambulatory Visit: Payer: Self-pay

## 2023-10-14 ENCOUNTER — Ambulatory Visit
Admission: RE | Admit: 2023-10-14 | Discharge: 2023-10-14 | Disposition: A | Discharge status: Home / Self Care | Source: Ambulatory Visit | Attending: Radiation Oncology

## 2023-10-14 ENCOUNTER — Inpatient Hospital Stay (HOSPITAL_BASED_OUTPATIENT_CLINIC_OR_DEPARTMENT_OTHER): Admitting: Hematology and Oncology

## 2023-10-14 ENCOUNTER — Inpatient Hospital Stay

## 2023-10-14 VITALS — BP 164/63 | HR 96 | Temp 98.0°F | Resp 17 | Wt 140.1 lb

## 2023-10-14 DIAGNOSIS — C50212 Malignant neoplasm of upper-inner quadrant of left female breast: Secondary | ICD-10-CM | POA: Diagnosis not present

## 2023-10-14 DIAGNOSIS — Z807 Family history of other malignant neoplasms of lymphoid, hematopoietic and related tissues: Secondary | ICD-10-CM | POA: Insufficient documentation

## 2023-10-14 DIAGNOSIS — R252 Cramp and spasm: Secondary | ICD-10-CM | POA: Insufficient documentation

## 2023-10-14 DIAGNOSIS — Z79818 Long term (current) use of other agents affecting estrogen receptors and estrogen levels: Secondary | ICD-10-CM | POA: Insufficient documentation

## 2023-10-14 DIAGNOSIS — Z87891 Personal history of nicotine dependence: Secondary | ICD-10-CM | POA: Insufficient documentation

## 2023-10-14 DIAGNOSIS — I130 Hypertensive heart and chronic kidney disease with heart failure and stage 1 through stage 4 chronic kidney disease, or unspecified chronic kidney disease: Secondary | ICD-10-CM | POA: Insufficient documentation

## 2023-10-14 DIAGNOSIS — Z1721 Progesterone receptor positive status: Secondary | ICD-10-CM | POA: Insufficient documentation

## 2023-10-14 DIAGNOSIS — Z86718 Personal history of other venous thrombosis and embolism: Secondary | ICD-10-CM | POA: Insufficient documentation

## 2023-10-14 DIAGNOSIS — M8580 Other specified disorders of bone density and structure, unspecified site: Secondary | ICD-10-CM | POA: Insufficient documentation

## 2023-10-14 DIAGNOSIS — K5900 Constipation, unspecified: Secondary | ICD-10-CM | POA: Insufficient documentation

## 2023-10-14 DIAGNOSIS — C7951 Secondary malignant neoplasm of bone: Secondary | ICD-10-CM

## 2023-10-14 DIAGNOSIS — Z803 Family history of malignant neoplasm of breast: Secondary | ICD-10-CM | POA: Insufficient documentation

## 2023-10-14 DIAGNOSIS — I890 Lymphedema, not elsewhere classified: Secondary | ICD-10-CM | POA: Insufficient documentation

## 2023-10-14 DIAGNOSIS — Z801 Family history of malignant neoplasm of trachea, bronchus and lung: Secondary | ICD-10-CM | POA: Insufficient documentation

## 2023-10-14 DIAGNOSIS — I700 Atherosclerosis of aorta: Secondary | ICD-10-CM | POA: Insufficient documentation

## 2023-10-14 DIAGNOSIS — Z923 Personal history of irradiation: Secondary | ICD-10-CM | POA: Insufficient documentation

## 2023-10-14 DIAGNOSIS — Z9221 Personal history of antineoplastic chemotherapy: Secondary | ICD-10-CM | POA: Insufficient documentation

## 2023-10-14 DIAGNOSIS — Z800 Family history of malignant neoplasm of digestive organs: Secondary | ICD-10-CM | POA: Insufficient documentation

## 2023-10-14 DIAGNOSIS — Z170 Estrogen receptor positive status [ER+]: Secondary | ICD-10-CM

## 2023-10-14 DIAGNOSIS — Z79811 Long term (current) use of aromatase inhibitors: Secondary | ICD-10-CM | POA: Insufficient documentation

## 2023-10-14 DIAGNOSIS — C770 Secondary and unspecified malignant neoplasm of lymph nodes of head, face and neck: Secondary | ICD-10-CM | POA: Diagnosis not present

## 2023-10-14 DIAGNOSIS — Z510 Encounter for antineoplastic radiation therapy: Secondary | ICD-10-CM | POA: Diagnosis not present

## 2023-10-14 DIAGNOSIS — J9621 Acute and chronic respiratory failure with hypoxia: Secondary | ICD-10-CM | POA: Insufficient documentation

## 2023-10-14 DIAGNOSIS — I5033 Acute on chronic diastolic (congestive) heart failure: Secondary | ICD-10-CM | POA: Insufficient documentation

## 2023-10-14 DIAGNOSIS — C50211 Malignant neoplasm of upper-inner quadrant of right female breast: Secondary | ICD-10-CM | POA: Insufficient documentation

## 2023-10-14 LAB — RAD ONC ARIA SESSION SUMMARY
Course Elapsed Days: 3
Plan Fractions Treated to Date: 3
Plan Prescribed Dose Per Fraction: 2.8 Gy
Plan Total Fractions Prescribed: 14
Plan Total Prescribed Dose: 39.2 Gy
Reference Point Dosage Given to Date: 11.2 Gy
Reference Point Session Dosage Given: 2.8 Gy
Session Number: 4

## 2023-10-14 LAB — CBC WITH DIFFERENTIAL (CANCER CENTER ONLY)
Abs Immature Granulocytes: 0.01 K/uL (ref 0.00–0.07)
Basophils Absolute: 0 K/uL (ref 0.0–0.1)
Basophils Relative: 1 %
Eosinophils Absolute: 0.1 K/uL (ref 0.0–0.5)
Eosinophils Relative: 1 %
HCT: 31 % — ABNORMAL LOW (ref 36.0–46.0)
Hemoglobin: 10.4 g/dL — ABNORMAL LOW (ref 12.0–15.0)
Immature Granulocytes: 0 %
Lymphocytes Relative: 26 %
Lymphs Abs: 0.9 K/uL (ref 0.7–4.0)
MCH: 34.2 pg — ABNORMAL HIGH (ref 26.0–34.0)
MCHC: 33.5 g/dL (ref 30.0–36.0)
MCV: 102 fL — ABNORMAL HIGH (ref 80.0–100.0)
Monocytes Absolute: 0.3 K/uL (ref 0.1–1.0)
Monocytes Relative: 7 %
Neutro Abs: 2.3 K/uL (ref 1.7–7.7)
Neutrophils Relative %: 65 %
Platelet Count: 170 K/uL (ref 150–400)
RBC: 3.04 MIL/uL — ABNORMAL LOW (ref 3.87–5.11)
RDW: 14.2 % (ref 11.5–15.5)
Smear Review: NORMAL
WBC Count: 3.5 K/uL — ABNORMAL LOW (ref 4.0–10.5)
nRBC: 0 % (ref 0.0–0.2)

## 2023-10-14 LAB — CMP (CANCER CENTER ONLY)
ALT: 10 U/L (ref 0–44)
AST: 20 U/L (ref 15–41)
Albumin: 4.7 g/dL (ref 3.5–5.0)
Alkaline Phosphatase: 61 U/L (ref 38–126)
Anion gap: 10 (ref 5–15)
BUN: 31 mg/dL — ABNORMAL HIGH (ref 8–23)
CO2: 25 mmol/L (ref 22–32)
Calcium: 9.6 mg/dL (ref 8.9–10.3)
Chloride: 106 mmol/L (ref 98–111)
Creatinine: 1.69 mg/dL — ABNORMAL HIGH (ref 0.44–1.00)
GFR, Estimated: 30 mL/min — ABNORMAL LOW (ref >60)
Glucose, Bld: 95 mg/dL (ref 70–99)
Potassium: 4.3 mmol/L (ref 3.5–5.1)
Sodium: 141 mmol/L (ref 135–145)
Total Bilirubin: 0.4 mg/dL (ref 0.0–1.2)
Total Protein: 8.2 g/dL — ABNORMAL HIGH (ref 6.5–8.1)

## 2023-10-14 LAB — MAGNESIUM: Magnesium: 2 mg/dL (ref 1.7–2.4)

## 2023-10-14 MED ORDER — FULVESTRANT 250 MG/5ML IM SOSY
500.0000 mg | PREFILLED_SYRINGE | Freq: Once | INTRAMUSCULAR | Status: AC
  Administered 2023-10-14: 500 mg via INTRAMUSCULAR
  Filled 2023-10-14: qty 10

## 2023-10-14 NOTE — Progress Notes (Signed)
 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   #2 status post 4 cycles of doxorubicin and cyclophosphamide,                          (i) followed by radiation therapy under the care of Dr. Jadine.   #3 received tamoxifen  for a total of seven years    RIGHT BREAST #4  S/P biopsy of the RIGHT breast upper inner quadrant on 11/14/10 showing invasive ductal carcinoma,, grade 2, estrogen receptor 85% and progesterone receptor 57% positive, Ki67 20%, HER2 not amplified.     #5 started neoadjuvant letrozole  in November 2012; switched to tamoxifen  as of February 2016 due to osteopenia concerns   #6  S/P right lumpectomy with sentinel lymph node biopsy on 09/03/11 for a ypT2, ypN1a, stage IIB invasive lobular carcinoma, grade 2,estrogen receptor 97% positive, progesterone receptor 12% positive, with no HER-2 amplification.   #7 status post right breast radiation therapy under the care of Dr. Patrcia from 10/19/2011 to 12/03/2011.    #8 did not meet criteria for genetic testing according to her insurance company.   #9 osteopenia, with a T score of -1.8 on DEXA scan at [facility] 03/07/2013             (a) status post multiple dental extractions and implants             (b) repeat bone density at [facility] 04/02/2015 shows a T score of -2.0   METASTATIC DISEASE: April 2021 (lymph nodes, bone)   #10:  left upper extremity lymphedema led to chest CT scan 05/09/2019 showing a 5.1 cm left subpectoral chest wall mass and thoracic lymphadenopathy              (a) CT biopsy of the chest wall mass 05/25/2019 confirms recurrent breast cancer, strongly estrogen and  progesterone receptor positive, HER-2 not amplified             (b) PET scan 05/30/2019 shows a left subpectoral mass measuring 5.4 cm, with significant regional and mediastinal adenopathy, sclerotic left scapular metastasis, but no liver or lung involvement.             (c) CA 27-29 is moderately informative   #11 anastrozole  started 06/02/2019; palbociclib  added 06/08/2019             (a) palbociclib  taken irregularly for several months secondary to cytopenias             (b) palbociclib  dose decreased to 100 mg daily 21 on 7 off October 2021             (c) CT of the chest with contrast 11/09/2019 shows mild disease progression (despite a continuing drop in the CA 27-29)             (d) fulvestrant  added 11/23/2019             (e) repeat chest CT with contrast 09/09/2020 read as stable.   #12 denosumab /Xgeva  starting 06/08/2019, to be repeated every 3 months due to hypocalcemia             (  a) treatment held after August 2022 dose secondary to ongoing dental issues   #13 genetics testing 06/15/2019 through the [facility] Common Hereditary Cancers Panel found no deleterious mutations in APC, ATM, AXIN2, BARD1, BMPR1A, BRCA1, BRCA2, BRIP1, CDH1, CDKN2A (p14ARF), CDKN2A (p16INK4a), CKD4, CHEK2, CTNNA1, DICER1, EPCAM (Deletion/duplication testing only), GREM1 (promoter region deletion/duplication testing only), KIT, MEN1, MLH1, MSH2, MSH3, MSH6, MUTYH, NBN, NF1, NHTL1, PALB2, PDGFRA, PMS2, POLD1, POLE, PTEN, RAD50, RAD51C, RAD51D, RNF43, SDHB, SDHC, SDHD, SMAD4, SMARCA4. STK11, TP53, TSC1, TSC2, and VHL.  The following genes were evaluated for sequence changes only: SDHA and HOXB13 c.251G>A variant only.  CURRENT THERAPY: Anastrozole , Fulvestrant , Palbociclib   INTERVAL HISTORY:  Discussed the use of AI scribe software for clinical note transcription with the patient, who gave verbal consent to proceed.  History of Present Illness Stacey [name] is an 83 year old female with breast cancer who  presents for follow-up    Stacey [name] is an 83 year old female with breast cancer who presents for follow-up regarding her ongoing treatment.  She is currently undergoing radiation therapy for breast cancer, scheduled to last for three weeks. The sessions have been longer than expected due to adjustments in the treatment area to ensure adequate coverage. She experiences cold during the sessions.  She is on Ibrance  at a dose of 75 mg, which she resumed after a previous hold due to severe diarrhea at a higher dose of 100 mg. The diarrhea was described as 'explosive' and intolerable. She is also taking anastrozole  and Faslodex  as part of her treatment regimen.  Recent lymph node analysis showed estrogen receptor positivity with weak staining, progesterone receptor was 50% positive with moderate to strong staining, and HER2 was negative. The growth rate was low to intermediate.  She experiences cramps in her leg, particularly at night, which she attributes to a possible magnesium  or salt imbalance. She takes 650 mg of magnesium  supplements daily and acknowledges not drinking enough water regularly, which may contribute to her symptoms.  She lives with her husband and has three children, with the youngest recently starting preschool. She has an au pair from [place] who assists with childcare but will be leaving soon. Her husband no longer travels for work, which may impact her childcare needs.    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) 09/24/2023   Aortic atherosclerosis (HCC) 08/10/2023   Diastolic CHF (HCC) 02/09/2023   CKD (chronic kidney disease), stage III (HCC) 01/12/2023   Orthostatic hypotension 01/12/2023   Acute on chronic diastolic heart failure (HCC) 12/26/2022   Hypokalemia 12/26/2022   Acute on chronic hypoxic respiratory failure (HCC) 12/05/2022    Metabolic acidosis 12/04/2022   Hypocalcemia 12/04/2022   Fatigue 12/04/2022   Pneumonia 12/04/2022   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   DVT (deep venous thrombosis) (HCC) 12/04/2022   Diarrhea 12/04/2022   Sepsis (HCC) 11/17/2022   History of DVT (deep vein thrombosis) 11/17/2022   Current chronic use of systemic steroids 11/17/2022   Tachypnea 11/17/2022   Pulmonary nodule 11/17/2022   Acute deep vein thrombosis (DVT) of femoral vein of right lower extremity (HCC) 04/13/2022   Nasal dryness 02/04/2021   Chronic sinusitis 11/25/2020   Allergic rhinitis 07/08/2020   Hypomagnesemia 06/10/2020   Goals of care, counseling/discussion 06/21/2019   Family history of breast cancer    Family history of  pancreatic cancer    Family history of uterine cancer    Family history of lymphoma    Family history of lung cancer    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HCC) 06/02/2019   Recurrent cancer of left breast (HCC) 05/15/2019   Pes anserine bursitis 03/31/2019   HTN (hypertension) 02/10/2019   Hyperglycemia 06/21/2018   Left carpal tunnel syndrome 02/22/2018   Rotator cuff arthropathy of left shoulder 09/20/2017   Arthritis of hand 08/23/2017   Pain in right hand 08/23/2017   Cervical radiculitis 04/09/2017   Left shoulder pain 04/09/2017   DOE (dyspnea on exertion) 05/14/2016   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   Lymphedema of left arm 01/14/2016   Left arm swelling 12/25/2015   SVT (supraventricular tachycardia) (HCC)    Left-sided carotid artery disease (HCC) 03/08/2015   Dizziness 01/24/2015   Urinary frequency 01/13/2015   Dizziness and giddiness 01/09/2015   Gallstones 02/21/2013   Malignant neoplasm of upper-inner quadrant of left breast in female, estrogen receptor positive (HCC) 11/28/2012   Muscle cramping 09/12/2012   Right hip pain 09/12/2012   Lower back pain 09/12/2012   COPD GOLD I     Hx of radiation  therapy    Right shoulder pain 09/14/2011   Preventative health care 07/29/2010   Skin lesion of left leg 07/29/2010   Paresthesia 07/29/2010   GERD 03/28/2010   Constipation 03/28/2010   Osteoarthrosis, 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   Anxiety state 05/03/2009   Hyperlipidemia 06/14/2007   FATIGUE 06/14/2007   Anemia 12/17/2006   Diverticulosis of colon 12/17/2006   Cough 12/17/2006   IRRITABLE BOWEL SYNDROME, HX OF 12/17/2006    is allergic to other, tape, aminoglycosides, bacitracin, bee venom, cephalexin, clindamycin/lincomycin, codeine, and nasacort  [triamcinolone ].  MEDICAL HISTORY: Past Medical History:  Diagnosis Date   ANEMIA-NOS    ANXIETY    Blood transfusion without reported diagnosis Nov. 2024   Breast cancer (HCC) 1997 L, 2012 R   s/p chemo/xrt   Cataract 2015   CHF (congestive heart failure) (HCC) Nov. 2024   COPD    resolved   DIVERTICULOSIS, COLON 2008   Dizziness    Emphysema of lung (HCC) NA   Family history of breast cancer    Family history of lung cancer    Family history of lymphoma    Family history of pancreatic cancer    Family history of uterine cancer    GERD    Hx of radiation therapy 10/19/11 -12/03/11   right breast   HYPERLIPIDEMIA    IRRITABLE BOWEL SYNDROME, HX OF    Left-sided carotid artery disease (HCC)    moderate left ICA stenosis   OSTEOARTHRITIS, HAND    PSVT (paroxysmal supraventricular tachycardia) (HCC)    symptomatic on event monitor    SURGICAL HISTORY: Past Surgical History:  Procedure Laterality Date   ABDOMINAL HYSTERECTOMY     APPENDECTOMY     BREAST BIOPSY  11/14/2010   r breast: inv, insitu mammary carcinoma w/calcif, er/pr +, her2 -   BREAST SURGERY     lumpectomy   CATARACT EXTRACTION     both eyes   COSMETIC SURGERY  1989   ELECTROPHYSIOLOGIC STUDY N/A 05/10/2015   Procedure: SVT Ablation;  Surgeon: Will Gladis Norton, MD;  Location: [facility];  Service: Cardiovascular;  Laterality: N/A;   HERNIA REPAIR      inguinal herniorrhapy  02/02/1982   left   IR US  GUIDE BX ASP/DRAIN  05/26/2019  rectal fissure repair     s/p benign breast biopsy  02/02/2001   right   s/p left foot surgury  02/03/2007   s/p lumpectomy  02/03/1995   melignant left x 2   spiral fx left foot  02/02/2006   no surgury   TMJ ARTHROPLASTY  02/03/1987   TONGUE SURGERY     1988- to remove scar tissue growth    TONSILLECTOMY      SOCIAL HISTORY: Social History   Socioeconomic History   Marital status: Divorced    Spouse name: 2 Step-children   Number of children: 2   Years of education: Not on file   Highest education level: Bachelor's degree (e.g., BA, AB, BS)  Occupational History   Occupation: retired training and development officer  Tobacco Use   Smoking status: Former    Current packs/day: 0.00    Average packs/day: 1 pack/day for 54.0 years (54.0 ttl pk-yrs)    Types: Cigarettes    Start date: 01/02/1962    Quit date: 01/03/2016    Years since quitting: 7.7   Smokeless tobacco: Never  Vaping Use   Vaping status: Never Used  Substance and Sexual Activity   Alcohol use: Yes    Comment: rare/ drinks socially   Drug use: No   Sexual activity: Never  Other Topics Concern   Not on file  Social History Narrative   Patient gets no regular exercise   No biological children   2 step children   Social Drivers of Corporate Investment Banker Strain: Low Risk  (08/06/2023)   Overall Financial Resource Strain (CARDIA)    Difficulty of Paying Living Expenses: Not hard at all  Food Insecurity: No Food Insecurity (08/06/2023)   Hunger Vital Sign    Worried About Running Out of Food in the Last Year: Never true    Ran Out of Food in the Last Year: Never true  Transportation Needs: No Transportation Needs (08/06/2023)   PRAPARE - Administrator, Civil Service (Medical): No    Lack of Transportation (Non-Medical): No  Physical Activity: Inactive (08/06/2023)   Exercise Vital Sign    Days of Exercise per Week: 0 days     Minutes of Exercise per Session: Not on file  Stress: No Stress Concern Present (08/06/2023)   [employer] of Occupational Health - Occupational Stress Questionnaire    Feeling of Stress: Not at all  Social Connections: Socially Isolated (08/06/2023)   Social Connection and Isolation Panel    Frequency of Communication with Friends and Family: More than three times a week    Frequency of Social Gatherings with Friends and Family: Patient declined    Attends Religious Services: Never    Database Administrator or Organizations: No    Attends Engineer, Structural: Not on file    Marital Status: Divorced  Intimate Partner Violence: Not At Risk (01/11/2023)   Humiliation, Afraid, Rape, and Kick questionnaire    Fear of Current or Ex-Partner: No    Emotionally Abused: No    Physically Abused: No    Sexually Abused: No    FAMILY HISTORY: Family History  Problem Relation Age of Onset   Hypertension Mother    Stroke Mother    Colon polyps Mother    Diabetes Mother    Pancreatic cancer Mother 44   Uterine cancer Mother 27   Cancer Mother    Lymphoma Brother        burkitts   Cancer Brother  Lung cancer Paternal Uncle    Lung cancer Maternal Grandmother 64       non-smoker   Lung cancer Maternal Grandfather    Breast cancer Cousin        maternal cousin, dx in her mid 60s   Brain cancer Cousin        maternal cousin's son; dx in his 47s   Testicular cancer Cousin        maternal cousin's son;    Breast cancer Cousin        paternal cousin; dx in her 67s   Alcohol abuse Father    Breast cancer Cousin        paternal cousin's daughter; dx in 51s; neg genetic testing   Asthma Brother    Asthma Brother    Colon cancer Neg Hx    Esophageal cancer Neg Hx    Stomach cancer Neg Hx    Rectal cancer Neg Hx     Review of Systems  Constitutional:  Positive for fatigue. Negative for appetite change, chills, fever and unexpected weight change.  HENT:   Negative for  hearing loss, lump/mass and trouble swallowing.   Eyes:  Negative for eye problems and icterus.  Respiratory:  Negative for chest tightness, cough and shortness of breath.   Cardiovascular:  Negative for chest pain, leg swelling and palpitations.  Gastrointestinal:  Negative for abdominal distention, abdominal pain, constipation, diarrhea, nausea and vomiting.  Endocrine: Negative for hot flashes.  Genitourinary:  Negative for difficulty urinating.   Musculoskeletal:  Negative for arthralgias.  Skin:  Negative for itching and rash.  Neurological:  Negative for dizziness, extremity weakness, headaches and numbness.  Hematological:  Negative for adenopathy. Does not bruise/bleed easily.  Psychiatric/Behavioral:  Negative for depression. The patient is not nervous/anxious.       PHYSICAL EXAMINATION     Vitals:   10/14/23 1326  BP: (!) 164/63  Pulse: 96  Resp: 17  Temp: 98 F (36.7 C)  SpO2: 96%    She appears well, no acute distress No palpable cervical or supraclavicular adenopathy CTA bilaterally RRR No LE edema.  LABORATORY DATA:  CBC    Component Value Date/Time   WBC 3.5 (L) 10/14/2023 1244   WBC 3.6 (L) 06/23/2023 1530   RBC 3.04 (L) 10/14/2023 1244   HGB 10.4 (L) 10/14/2023 1244   HGB 12.4 12/09/2015 1408   HCT 31.0 (L) 10/14/2023 1244   HCT 25.8 (L) 07/03/2022 1345   HCT 36.2 12/09/2015 1408   PLT 170 10/14/2023 1244   PLT 238 12/09/2015 1408   MCV 102.0 (H) 10/14/2023 1244   MCV 89.8 12/09/2015 1408   MCH 34.2 (H) 10/14/2023 1244   MCHC 33.5 10/14/2023 1244   RDW 14.2 10/14/2023 1244   RDW 13.2 12/09/2015 1408   LYMPHSABS PENDING 10/14/2023 1244   LYMPHSABS 2.1 12/09/2015 1408   MONOABS PENDING 10/14/2023 1244   MONOABS 0.5 12/09/2015 1408   EOSABS PENDING 10/14/2023 1244   EOSABS 0.2 12/09/2015 1408   BASOSABS PENDING 10/14/2023 1244   BASOSABS 0.0 12/09/2015 1408    CMP     Component Value Date/Time   NA 140 09/15/2023 1130   NA 140  12/09/2015 1409   K 4.0 09/15/2023 1130   K 4.2 12/09/2015 1409   CL 108 09/15/2023 1130   CL 107 02/02/2012 0939   CO2 22 09/15/2023 1130   CO2 25 12/09/2015 1409   GLUCOSE 109 (H) 09/15/2023 1130   GLUCOSE 102 12/09/2015 1409  GLUCOSE 104 (H) 02/02/2012 0939   BUN 31 (H) 09/15/2023 1130   BUN 16.7 12/09/2015 1409   CREATININE 1.66 (H) 09/15/2023 1130   CREATININE 0.8 12/09/2015 1409   CALCIUM  9.5 09/15/2023 1130   CALCIUM  9.4 12/09/2015 1409   PROT 7.6 09/15/2023 1130   PROT 7.0 12/09/2015 1409   ALBUMIN 4.5 09/15/2023 1130   ALBUMIN 3.3 (L) 12/09/2015 1409   AST 19 09/15/2023 1130   AST 20 12/09/2015 1409   ALT 10 09/15/2023 1130   ALT 13 12/09/2015 1409   ALKPHOS 57 09/15/2023 1130   ALKPHOS 54 12/09/2015 1409   BILITOT 0.4 09/15/2023 1130   BILITOT <0.22 12/09/2015 1409   GFRNONAA 30 (L) 09/15/2023 1130   GFRAA >60 10/26/2019 1347   GFRAA >60 05/09/2019 1303       ASSESSMENT and THERAPY PLAN:   Assessment and Plan Assessment & Plan  Metastatic ER+/PR+, HER2- breast cancer involving axillary lymph nodes ER+ with weak estrogen receptor staining, PR+ with moderate to strong staining, HER2-. Low to intermediate growth rate.  Radiation therapy adjusted for lymph node involvement. Ibrance  resumed at 75 mg due to previous diarrhea. Anastrozole  and Faslodex  continued. Tumor marker CA 27-29 pending. - Continue Ibrance  75 mg. - Continue anastrozole . - Continue Faslodex . - Continue radiation therapy. - Monitor CA 27-29 tumor marker. - Rescan in October or November We discussed about foundation one testing on LN biopsy, but I dont see any results Discussed with our nursing team again to follow up on this.  Diarrhea secondary to cancer therapy Severe diarrhea at 100 mg Ibrance , improved at 75 mg. - Continue Ibrance  75 mg.  Muscle cramps likely related to electrolyte imbalance Muscle cramps possibly due to inadequate salt intake or hydration. Taking 650 mg magnesium   supplements. - Increase salt intake. - Consider drinking pickle juice for muscle cramps.  All questions were answered. The patient knows to call the clinic with any problems, questions or concerns. We can certainly see the patient much sooner if necessary.  Total encounter time:30 minutes*in face-to-face visit time, chart review, lab review, care coordination, order entry, and documentation of the encounter time.   *Total Encounter Time as defined by the Centers for Medicare and Medicaid Services includes, in addition to the face-to-face time of a patient visit (documented in the note above) non-face-to-face time: obtaining and reviewing outside history, ordering and reviewing medications, tests or procedures, care coordination (communications with other health care professionals or caregivers) and documentation in the medical record.

## 2023-10-15 ENCOUNTER — Other Ambulatory Visit

## 2023-10-15 ENCOUNTER — Ambulatory Visit

## 2023-10-15 ENCOUNTER — Ambulatory Visit: Admitting: Hematology and Oncology

## 2023-10-15 ENCOUNTER — Ambulatory Visit
Admission: RE | Admit: 2023-10-15 | Discharge: 2023-10-15 | Discharge status: Home / Self Care | Source: Ambulatory Visit | Attending: Radiation Oncology

## 2023-10-15 ENCOUNTER — Encounter: Payer: Self-pay | Admitting: Oncology

## 2023-10-15 ENCOUNTER — Other Ambulatory Visit: Payer: Self-pay

## 2023-10-15 DIAGNOSIS — Z510 Encounter for antineoplastic radiation therapy: Secondary | ICD-10-CM | POA: Diagnosis not present

## 2023-10-15 DIAGNOSIS — C7951 Secondary malignant neoplasm of bone: Secondary | ICD-10-CM | POA: Diagnosis not present

## 2023-10-15 DIAGNOSIS — C770 Secondary and unspecified malignant neoplasm of lymph nodes of head, face and neck: Secondary | ICD-10-CM | POA: Diagnosis not present

## 2023-10-15 DIAGNOSIS — Z170 Estrogen receptor positive status [ER+]: Secondary | ICD-10-CM | POA: Diagnosis not present

## 2023-10-15 DIAGNOSIS — C50212 Malignant neoplasm of upper-inner quadrant of left female breast: Secondary | ICD-10-CM | POA: Diagnosis not present

## 2023-10-15 LAB — RAD ONC ARIA SESSION SUMMARY
Course Elapsed Days: 4
Plan Fractions Treated to Date: 4
Plan Prescribed Dose Per Fraction: 2.8 Gy
Plan Total Fractions Prescribed: 14
Plan Total Prescribed Dose: 39.2 Gy
Reference Point Dosage Given to Date: 14 Gy
Reference Point Session Dosage Given: 2.8 Gy
Session Number: 5

## 2023-10-15 LAB — CANCER ANTIGEN 27.29: CA 27.29: 49.3 U/mL — ABNORMAL HIGH (ref 0.0–38.6)

## 2023-10-17 ENCOUNTER — Encounter: Payer: Self-pay | Admitting: Hematology and Oncology

## 2023-10-18 ENCOUNTER — Ambulatory Visit
Admission: RE | Admit: 2023-10-18 | Discharge: 2023-10-18 | Disposition: A | Discharge status: Home / Self Care | Source: Ambulatory Visit | Attending: Radiation Oncology | Admitting: Radiation Oncology

## 2023-10-18 ENCOUNTER — Ambulatory Visit
Admission: RE | Admit: 2023-10-18 | Discharge: 2023-10-18 | Disposition: A | Discharge status: Home / Self Care | Source: Ambulatory Visit | Attending: Radiation Oncology

## 2023-10-18 ENCOUNTER — Encounter (HOSPITAL_COMMUNITY): Payer: Self-pay

## 2023-10-18 ENCOUNTER — Other Ambulatory Visit: Payer: Self-pay

## 2023-10-18 DIAGNOSIS — C7951 Secondary malignant neoplasm of bone: Secondary | ICD-10-CM | POA: Diagnosis not present

## 2023-10-18 DIAGNOSIS — C770 Secondary and unspecified malignant neoplasm of lymph nodes of head, face and neck: Secondary | ICD-10-CM | POA: Diagnosis not present

## 2023-10-18 DIAGNOSIS — Z170 Estrogen receptor positive status [ER+]: Secondary | ICD-10-CM | POA: Diagnosis not present

## 2023-10-18 DIAGNOSIS — Z510 Encounter for antineoplastic radiation therapy: Secondary | ICD-10-CM | POA: Diagnosis not present

## 2023-10-18 DIAGNOSIS — C50212 Malignant neoplasm of upper-inner quadrant of left female breast: Secondary | ICD-10-CM | POA: Diagnosis not present

## 2023-10-18 LAB — RAD ONC ARIA SESSION SUMMARY
Course Elapsed Days: 7
Plan Fractions Treated to Date: 5
Plan Prescribed Dose Per Fraction: 2.8 Gy
Plan Total Fractions Prescribed: 14
Plan Total Prescribed Dose: 39.2 Gy
Reference Point Dosage Given to Date: 16.8 Gy
Reference Point Session Dosage Given: 2.8 Gy
Session Number: 6

## 2023-10-19 ENCOUNTER — Ambulatory Visit
Admission: RE | Admit: 2023-10-19 | Discharge: 2023-10-19 | Disposition: A | Discharge status: Home / Self Care | Source: Ambulatory Visit | Attending: Radiation Oncology | Admitting: Radiation Oncology

## 2023-10-19 ENCOUNTER — Other Ambulatory Visit: Payer: Self-pay

## 2023-10-19 DIAGNOSIS — Z510 Encounter for antineoplastic radiation therapy: Secondary | ICD-10-CM | POA: Diagnosis not present

## 2023-10-19 DIAGNOSIS — Z170 Estrogen receptor positive status [ER+]: Secondary | ICD-10-CM | POA: Diagnosis not present

## 2023-10-19 DIAGNOSIS — C770 Secondary and unspecified malignant neoplasm of lymph nodes of head, face and neck: Secondary | ICD-10-CM | POA: Diagnosis not present

## 2023-10-19 DIAGNOSIS — C7951 Secondary malignant neoplasm of bone: Secondary | ICD-10-CM | POA: Diagnosis not present

## 2023-10-19 DIAGNOSIS — C50212 Malignant neoplasm of upper-inner quadrant of left female breast: Secondary | ICD-10-CM | POA: Diagnosis not present

## 2023-10-19 LAB — RAD ONC ARIA SESSION SUMMARY
Course Elapsed Days: 8
Plan Fractions Treated to Date: 6
Plan Prescribed Dose Per Fraction: 2.8 Gy
Plan Total Fractions Prescribed: 14
Plan Total Prescribed Dose: 39.2 Gy
Reference Point Dosage Given to Date: 19.6 Gy
Reference Point Session Dosage Given: 2.8 Gy
Session Number: 7

## 2023-10-20 ENCOUNTER — Ambulatory Visit
Admission: RE | Admit: 2023-10-20 | Discharge: 2023-10-20 | Disposition: A | Discharge status: Home / Self Care | Source: Ambulatory Visit | Attending: Radiation Oncology | Admitting: Radiation Oncology

## 2023-10-20 ENCOUNTER — Other Ambulatory Visit: Payer: Self-pay

## 2023-10-20 DIAGNOSIS — C7951 Secondary malignant neoplasm of bone: Secondary | ICD-10-CM | POA: Diagnosis not present

## 2023-10-20 DIAGNOSIS — C50212 Malignant neoplasm of upper-inner quadrant of left female breast: Secondary | ICD-10-CM | POA: Diagnosis not present

## 2023-10-20 DIAGNOSIS — Z170 Estrogen receptor positive status [ER+]: Secondary | ICD-10-CM | POA: Diagnosis not present

## 2023-10-20 DIAGNOSIS — Z510 Encounter for antineoplastic radiation therapy: Secondary | ICD-10-CM | POA: Diagnosis not present

## 2023-10-20 DIAGNOSIS — C770 Secondary and unspecified malignant neoplasm of lymph nodes of head, face and neck: Secondary | ICD-10-CM | POA: Diagnosis not present

## 2023-10-20 LAB — RAD ONC ARIA SESSION SUMMARY
Course Elapsed Days: 9
Plan Fractions Treated to Date: 7
Plan Prescribed Dose Per Fraction: 2.8 Gy
Plan Total Fractions Prescribed: 14
Plan Total Prescribed Dose: 39.2 Gy
Reference Point Dosage Given to Date: 22.4 Gy
Reference Point Session Dosage Given: 2.8 Gy
Session Number: 8

## 2023-10-21 ENCOUNTER — Ambulatory Visit
Admission: RE | Admit: 2023-10-21 | Discharge: 2023-10-21 | Disposition: A | Discharge status: Home / Self Care | Source: Ambulatory Visit | Attending: Radiation Oncology | Admitting: Radiation Oncology

## 2023-10-21 ENCOUNTER — Other Ambulatory Visit: Payer: Self-pay

## 2023-10-21 ENCOUNTER — Encounter: Payer: Self-pay | Admitting: Hematology and Oncology

## 2023-10-21 DIAGNOSIS — Z510 Encounter for antineoplastic radiation therapy: Secondary | ICD-10-CM | POA: Diagnosis not present

## 2023-10-21 DIAGNOSIS — Z170 Estrogen receptor positive status [ER+]: Secondary | ICD-10-CM | POA: Diagnosis not present

## 2023-10-21 DIAGNOSIS — C770 Secondary and unspecified malignant neoplasm of lymph nodes of head, face and neck: Secondary | ICD-10-CM | POA: Diagnosis not present

## 2023-10-21 DIAGNOSIS — C50212 Malignant neoplasm of upper-inner quadrant of left female breast: Secondary | ICD-10-CM | POA: Diagnosis not present

## 2023-10-21 DIAGNOSIS — C7951 Secondary malignant neoplasm of bone: Secondary | ICD-10-CM | POA: Diagnosis not present

## 2023-10-21 LAB — RAD ONC ARIA SESSION SUMMARY
Course Elapsed Days: 10
Plan Fractions Treated to Date: 8
Plan Prescribed Dose Per Fraction: 2.8 Gy
Plan Total Fractions Prescribed: 14
Plan Total Prescribed Dose: 39.2 Gy
Reference Point Dosage Given to Date: 25.2 Gy
Reference Point Session Dosage Given: 2.8 Gy
Session Number: 9

## 2023-10-22 ENCOUNTER — Other Ambulatory Visit: Payer: Self-pay

## 2023-10-22 ENCOUNTER — Ambulatory Visit
Admission: RE | Admit: 2023-10-22 | Discharge: 2023-10-22 | Disposition: A | Discharge status: Home / Self Care | Source: Ambulatory Visit | Attending: Radiation Oncology | Admitting: Radiation Oncology

## 2023-10-22 DIAGNOSIS — C50212 Malignant neoplasm of upper-inner quadrant of left female breast: Secondary | ICD-10-CM | POA: Diagnosis not present

## 2023-10-22 DIAGNOSIS — Z170 Estrogen receptor positive status [ER+]: Secondary | ICD-10-CM | POA: Diagnosis not present

## 2023-10-22 DIAGNOSIS — Z510 Encounter for antineoplastic radiation therapy: Secondary | ICD-10-CM | POA: Diagnosis not present

## 2023-10-22 DIAGNOSIS — C770 Secondary and unspecified malignant neoplasm of lymph nodes of head, face and neck: Secondary | ICD-10-CM | POA: Diagnosis not present

## 2023-10-22 DIAGNOSIS — C7951 Secondary malignant neoplasm of bone: Secondary | ICD-10-CM | POA: Diagnosis not present

## 2023-10-22 LAB — RAD ONC ARIA SESSION SUMMARY
Course Elapsed Days: 11
Plan Fractions Treated to Date: 9
Plan Prescribed Dose Per Fraction: 2.8 Gy
Plan Total Fractions Prescribed: 14
Plan Total Prescribed Dose: 39.2 Gy
Reference Point Dosage Given to Date: 28 Gy
Reference Point Session Dosage Given: 2.8 Gy
Session Number: 10

## 2023-10-25 ENCOUNTER — Ambulatory Visit
Admission: RE | Admit: 2023-10-25 | Discharge: 2023-10-25 | Disposition: A | Discharge status: Home / Self Care | Source: Ambulatory Visit | Attending: Radiation Oncology

## 2023-10-25 ENCOUNTER — Other Ambulatory Visit: Payer: Self-pay

## 2023-10-25 ENCOUNTER — Ambulatory Visit
Admission: RE | Admit: 2023-10-25 | Discharge: 2023-10-25 | Disposition: A | Discharge status: Home / Self Care | Source: Ambulatory Visit | Attending: Radiation Oncology | Admitting: Radiation Oncology

## 2023-10-25 DIAGNOSIS — C770 Secondary and unspecified malignant neoplasm of lymph nodes of head, face and neck: Secondary | ICD-10-CM | POA: Diagnosis not present

## 2023-10-25 DIAGNOSIS — Z170 Estrogen receptor positive status [ER+]: Secondary | ICD-10-CM | POA: Diagnosis not present

## 2023-10-25 DIAGNOSIS — C7951 Secondary malignant neoplasm of bone: Secondary | ICD-10-CM | POA: Diagnosis not present

## 2023-10-25 DIAGNOSIS — Z510 Encounter for antineoplastic radiation therapy: Secondary | ICD-10-CM | POA: Diagnosis not present

## 2023-10-25 DIAGNOSIS — C50212 Malignant neoplasm of upper-inner quadrant of left female breast: Secondary | ICD-10-CM | POA: Diagnosis not present

## 2023-10-25 LAB — RAD ONC ARIA SESSION SUMMARY
Course Elapsed Days: 14
Plan Fractions Treated to Date: 10
Plan Prescribed Dose Per Fraction: 2.8 Gy
Plan Total Fractions Prescribed: 14
Plan Total Prescribed Dose: 39.2 Gy
Reference Point Dosage Given to Date: 30.8 Gy
Reference Point Session Dosage Given: 2.8 Gy
Session Number: 11

## 2023-10-26 ENCOUNTER — Other Ambulatory Visit: Payer: Self-pay

## 2023-10-26 ENCOUNTER — Ambulatory Visit
Admission: RE | Admit: 2023-10-26 | Discharge: 2023-10-26 | Disposition: A | Discharge status: Home / Self Care | Source: Ambulatory Visit | Attending: Radiation Oncology

## 2023-10-26 DIAGNOSIS — Z510 Encounter for antineoplastic radiation therapy: Secondary | ICD-10-CM | POA: Diagnosis not present

## 2023-10-26 DIAGNOSIS — C7951 Secondary malignant neoplasm of bone: Secondary | ICD-10-CM | POA: Diagnosis not present

## 2023-10-26 DIAGNOSIS — C50212 Malignant neoplasm of upper-inner quadrant of left female breast: Secondary | ICD-10-CM | POA: Diagnosis not present

## 2023-10-26 DIAGNOSIS — Z170 Estrogen receptor positive status [ER+]: Secondary | ICD-10-CM | POA: Diagnosis not present

## 2023-10-26 DIAGNOSIS — C770 Secondary and unspecified malignant neoplasm of lymph nodes of head, face and neck: Secondary | ICD-10-CM | POA: Diagnosis not present

## 2023-10-26 LAB — RAD ONC ARIA SESSION SUMMARY
Course Elapsed Days: 15
Plan Fractions Treated to Date: 11
Plan Prescribed Dose Per Fraction: 2.8 Gy
Plan Total Fractions Prescribed: 14
Plan Total Prescribed Dose: 39.2 Gy
Reference Point Dosage Given to Date: 33.6 Gy
Reference Point Session Dosage Given: 2.8 Gy
Session Number: 12

## 2023-10-27 ENCOUNTER — Other Ambulatory Visit: Payer: Self-pay

## 2023-10-27 ENCOUNTER — Ambulatory Visit
Admission: RE | Admit: 2023-10-27 | Discharge: 2023-10-27 | Disposition: A | Discharge status: Home / Self Care | Source: Ambulatory Visit | Attending: Radiation Oncology | Admitting: Radiation Oncology

## 2023-10-27 DIAGNOSIS — Z170 Estrogen receptor positive status [ER+]: Secondary | ICD-10-CM | POA: Diagnosis not present

## 2023-10-27 DIAGNOSIS — C770 Secondary and unspecified malignant neoplasm of lymph nodes of head, face and neck: Secondary | ICD-10-CM | POA: Diagnosis not present

## 2023-10-27 DIAGNOSIS — C7951 Secondary malignant neoplasm of bone: Secondary | ICD-10-CM | POA: Diagnosis not present

## 2023-10-27 DIAGNOSIS — C50212 Malignant neoplasm of upper-inner quadrant of left female breast: Secondary | ICD-10-CM | POA: Diagnosis not present

## 2023-10-27 DIAGNOSIS — Z510 Encounter for antineoplastic radiation therapy: Secondary | ICD-10-CM | POA: Diagnosis not present

## 2023-10-27 LAB — RAD ONC ARIA SESSION SUMMARY
Course Elapsed Days: 16
Plan Fractions Treated to Date: 12
Plan Prescribed Dose Per Fraction: 2.8 Gy
Plan Total Fractions Prescribed: 14
Plan Total Prescribed Dose: 39.2 Gy
Reference Point Dosage Given to Date: 36.4 Gy
Reference Point Session Dosage Given: 2.8 Gy
Session Number: 13

## 2023-10-28 ENCOUNTER — Ambulatory Visit
Admission: RE | Admit: 2023-10-28 | Discharge: 2023-10-28 | Discharge status: Home / Self Care | Source: Ambulatory Visit | Attending: Radiation Oncology

## 2023-10-28 ENCOUNTER — Ambulatory Visit

## 2023-10-28 ENCOUNTER — Other Ambulatory Visit: Payer: Self-pay

## 2023-10-28 DIAGNOSIS — C770 Secondary and unspecified malignant neoplasm of lymph nodes of head, face and neck: Secondary | ICD-10-CM | POA: Diagnosis not present

## 2023-10-28 DIAGNOSIS — Z170 Estrogen receptor positive status [ER+]: Secondary | ICD-10-CM | POA: Diagnosis not present

## 2023-10-28 DIAGNOSIS — Z510 Encounter for antineoplastic radiation therapy: Secondary | ICD-10-CM | POA: Diagnosis not present

## 2023-10-28 DIAGNOSIS — C7951 Secondary malignant neoplasm of bone: Secondary | ICD-10-CM | POA: Diagnosis not present

## 2023-10-28 DIAGNOSIS — C50212 Malignant neoplasm of upper-inner quadrant of left female breast: Secondary | ICD-10-CM | POA: Diagnosis not present

## 2023-10-28 LAB — RAD ONC ARIA SESSION SUMMARY
Course Elapsed Days: 17
Plan Fractions Treated to Date: 13
Plan Prescribed Dose Per Fraction: 2.8 Gy
Plan Total Fractions Prescribed: 14
Plan Total Prescribed Dose: 39.2 Gy
Reference Point Dosage Given to Date: 39.2 Gy
Reference Point Session Dosage Given: 2.8 Gy
Session Number: 14

## 2023-10-29 ENCOUNTER — Other Ambulatory Visit: Payer: Self-pay

## 2023-10-29 ENCOUNTER — Ambulatory Visit
Admission: RE | Admit: 2023-10-29 | Discharge: 2023-10-29 | Disposition: A | Discharge status: Home / Self Care | Source: Ambulatory Visit | Attending: Radiation Oncology | Admitting: Radiation Oncology

## 2023-10-29 DIAGNOSIS — Z510 Encounter for antineoplastic radiation therapy: Secondary | ICD-10-CM | POA: Diagnosis not present

## 2023-10-29 DIAGNOSIS — C770 Secondary and unspecified malignant neoplasm of lymph nodes of head, face and neck: Secondary | ICD-10-CM | POA: Diagnosis not present

## 2023-10-29 DIAGNOSIS — Z170 Estrogen receptor positive status [ER+]: Secondary | ICD-10-CM | POA: Diagnosis not present

## 2023-10-29 DIAGNOSIS — C7951 Secondary malignant neoplasm of bone: Secondary | ICD-10-CM | POA: Diagnosis not present

## 2023-10-29 DIAGNOSIS — C50212 Malignant neoplasm of upper-inner quadrant of left female breast: Secondary | ICD-10-CM | POA: Diagnosis not present

## 2023-10-29 LAB — RAD ONC ARIA SESSION SUMMARY
Course Elapsed Days: 18
Plan Fractions Treated to Date: 14
Plan Prescribed Dose Per Fraction: 2.8 Gy
Plan Total Fractions Prescribed: 14
Plan Total Prescribed Dose: 39.2 Gy
Reference Point Dosage Given to Date: 42 Gy
Reference Point Session Dosage Given: 2.8 Gy
Session Number: 15

## 2023-10-31 ENCOUNTER — Other Ambulatory Visit: Payer: Self-pay | Admitting: Hematology and Oncology

## 2023-11-01 ENCOUNTER — Encounter: Payer: Self-pay | Admitting: Oncology

## 2023-11-01 NOTE — Radiation Completion Notes (Signed)
 Patient Name: Stacey [name], Stacey [name] MRN: [id number] Date of Birth: 04/30/1940 Referring Physician: AMBER STALLS, M.D. Date of Service: 2023-11-01 Radiation Oncologist: Lauraine Golden, M.D.  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, face and neck Staging on 2014-12-19: Malignant neoplasm of upper-inner quadrant of left breast in female, estrogen receptor positive (HCC) T=T2, N=N1, M=M0 Intent: Palliative     ==========DELIVERED PLANS==========  First Treatment Date: 2023-10-11 Last Treatment Date: 2023-10-29   Plan Name: Chest_L_SCV Site: Sclav-LT Technique: IMRT Mode: Photon Dose Per Fraction: 2.8 Gy Prescribed Dose (Delivered / Prescribed): 2.8 Gy / 2.8 Gy Prescribed Fxs (Delivered / Prescribed): 1 / 1   Plan Name: Chest_L_SCV:1 Site: Sclav-LT Technique: IMRT Mode: Photon Dose Per Fraction: 2.8 Gy Prescribed Dose (Delivered / Prescribed): 39.2 Gy / 39.2 Gy Prescribed Fxs (Delivered / Prescribed): 14 / 14     ==========ON TREATMENT VISIT DATES========== 2023-10-11, 2023-10-18, 2023-10-25     ==========UPCOMING VISITS========== 12/10/2023 [facility] INJECTION [facility] FL[facility]  12/10/2023 [facility] LAB [facility]  12/10/2023 [facility] EST PT 15 STALLS AMBER, MD  12/02/2023 [facility] FOLLOW UP 20 Golden Lauraine, MD  11/18/2023 [facility] INJECTION [facility] FLUSH  11/11/2023 [facility]        ==========APPENDIX - ON TREATMENT VISIT NOTES==========   See weekly On Treatment Notes in Epic for details in the Media tab (listed as Progress notes on the On Treatment Visit Dates listed above).

## 2023-11-11 ENCOUNTER — Inpatient Hospital Stay

## 2023-11-11 ENCOUNTER — Other Ambulatory Visit: Payer: Self-pay

## 2023-11-11 ENCOUNTER — Inpatient Hospital Stay: Attending: Adult Health

## 2023-11-11 VITALS — BP 159/71 | HR 79 | Temp 98.5°F | Resp 17

## 2023-11-11 DIAGNOSIS — C50212 Malignant neoplasm of upper-inner quadrant of left female breast: Secondary | ICD-10-CM | POA: Diagnosis not present

## 2023-11-11 DIAGNOSIS — Z170 Estrogen receptor positive status [ER+]: Secondary | ICD-10-CM | POA: Diagnosis not present

## 2023-11-11 DIAGNOSIS — C7951 Secondary malignant neoplasm of bone: Secondary | ICD-10-CM

## 2023-11-11 DIAGNOSIS — Z79818 Long term (current) use of other agents affecting estrogen receptors and estrogen levels: Secondary | ICD-10-CM | POA: Diagnosis not present

## 2023-11-11 LAB — CMP (CANCER CENTER ONLY)
ALT: 9 U/L (ref 0–44)
AST: 18 U/L (ref 15–41)
Albumin: 4.2 g/dL (ref 3.5–5.0)
Alkaline Phosphatase: 64 U/L (ref 38–126)
Anion gap: 9 (ref 5–15)
BUN: 25 mg/dL — ABNORMAL HIGH (ref 8–23)
CO2: 24 mmol/L (ref 22–32)
Calcium: 9.5 mg/dL (ref 8.9–10.3)
Chloride: 107 mmol/L (ref 98–111)
Creatinine: 1.61 mg/dL — ABNORMAL HIGH (ref 0.44–1.00)
GFR, Estimated: 32 mL/min — ABNORMAL LOW (ref >60)
Glucose, Bld: 112 mg/dL — ABNORMAL HIGH (ref 70–99)
Potassium: 4.1 mmol/L (ref 3.5–5.1)
Sodium: 140 mmol/L (ref 135–145)
Total Bilirubin: 0.3 mg/dL (ref 0.0–1.2)
Total Protein: 7.5 g/dL (ref 6.5–8.1)

## 2023-11-11 LAB — CBC WITH DIFFERENTIAL (CANCER CENTER ONLY)
Abs Immature Granulocytes: 0 K/uL (ref 0.00–0.07)
Basophils Absolute: 0 K/uL (ref 0.0–0.1)
Basophils Relative: 1 %
Eosinophils Absolute: 0.1 K/uL (ref 0.0–0.5)
Eosinophils Relative: 2 %
HCT: 31.6 % — ABNORMAL LOW (ref 36.0–46.0)
Hemoglobin: 10.7 g/dL — ABNORMAL LOW (ref 12.0–15.0)
Immature Granulocytes: 0 %
Lymphocytes Relative: 21 %
Lymphs Abs: 0.7 K/uL (ref 0.7–4.0)
MCH: 34.4 pg — ABNORMAL HIGH (ref 26.0–34.0)
MCHC: 33.9 g/dL (ref 30.0–36.0)
MCV: 101.6 fL — ABNORMAL HIGH (ref 80.0–100.0)
Monocytes Absolute: 0.2 K/uL (ref 0.1–1.0)
Monocytes Relative: 7 %
Neutro Abs: 2.2 K/uL (ref 1.7–7.7)
Neutrophils Relative %: 69 %
Platelet Count: 152 K/uL (ref 150–400)
RBC: 3.11 MIL/uL — ABNORMAL LOW (ref 3.87–5.11)
RDW: 13.9 % (ref 11.5–15.5)
WBC Count: 3.1 K/uL — ABNORMAL LOW (ref 4.0–10.5)
nRBC: 0 % (ref 0.0–0.2)

## 2023-11-11 LAB — MAGNESIUM: Magnesium: 2.2 mg/dL (ref 1.7–2.4)

## 2023-11-11 MED ORDER — FULVESTRANT 250 MG/5ML IM SOSY
500.0000 mg | PREFILLED_SYRINGE | Freq: Once | INTRAMUSCULAR | Status: AC
  Administered 2023-11-11: 500 mg via INTRAMUSCULAR
  Filled 2023-11-11: qty 10

## 2023-11-12 LAB — CANCER ANTIGEN 27.29: CA 27.29: 41.6 U/mL — ABNORMAL HIGH (ref 0.0–38.6)

## 2023-11-15 ENCOUNTER — Other Ambulatory Visit: Payer: Self-pay

## 2023-11-15 NOTE — Progress Notes (Signed)
 Specialty Pharmacy Refill Coordination Note  Stacey [name] is a 83 y.o. female contacted today regarding refills of specialty medication(s) Palbociclib  (IBRANCE )   Patient requested Delivery   Delivery date: 11/17/23   Verified address: [address]   [place] [address]   Medication will be filled on 11/16/23.

## 2023-11-18 ENCOUNTER — Inpatient Hospital Stay

## 2023-11-20 ENCOUNTER — Other Ambulatory Visit: Payer: Self-pay | Admitting: Hematology and Oncology

## 2023-11-21 ENCOUNTER — Encounter: Payer: Self-pay | Admitting: Oncology

## 2023-11-25 NOTE — Progress Notes (Signed)
 Patient identity verified x2.  Patient follow-up for a diagnosis of: Metastatic Breast Cancer Involving Axillary Lymph Nodes   Location-  Axillary Lymph Nodes  They completed their radiation on: 10/29/2023 Patient is doing well.  Does the patient complain of any of the following: Chest pains/ SOB: Yes, patient Headaches/ Dizziness: None Post radiation skin issues None Joint pain/ Swelling: None Motor function issues: None Range of motion limitations: None Fatigue post radiation: Fatigue is still here Appetite good/fair/poor: Appetite is good. Trouble swallowing- none  Patient is hydrating well.   Additional comments if applicable:  None    Encouraged patient to call our office with any questions or concerns.

## 2023-12-02 ENCOUNTER — Telehealth: Payer: Self-pay

## 2023-12-02 ENCOUNTER — Ambulatory Visit
Admission: RE | Admit: 2023-12-02 | Discharge: 2023-12-02 | Disposition: A | Discharge status: Home / Self Care | Source: Ambulatory Visit | Attending: Radiation Oncology | Admitting: Radiation Oncology

## 2023-12-02 DIAGNOSIS — C770 Secondary and unspecified malignant neoplasm of lymph nodes of head, face and neck: Secondary | ICD-10-CM

## 2023-12-02 NOTE — Telephone Encounter (Signed)
 Called patient for her one month telephone follow up but no response. Left voicemail for patient to call me back.  Will be reaching out when the time of the appointment gets closer

## 2023-12-06 ENCOUNTER — Encounter: Payer: Self-pay | Admitting: Hematology and Oncology

## 2023-12-07 ENCOUNTER — Other Ambulatory Visit: Payer: Self-pay

## 2023-12-07 ENCOUNTER — Encounter (INDEPENDENT_AMBULATORY_CARE_PROVIDER_SITE_OTHER): Payer: Self-pay

## 2023-12-07 ENCOUNTER — Other Ambulatory Visit: Payer: Self-pay | Admitting: Hematology and Oncology

## 2023-12-07 ENCOUNTER — Other Ambulatory Visit: Payer: Self-pay | Admitting: Pharmacy Technician

## 2023-12-07 ENCOUNTER — Other Ambulatory Visit (HOSPITAL_COMMUNITY): Payer: Self-pay

## 2023-12-07 DIAGNOSIS — C7951 Secondary malignant neoplasm of bone: Secondary | ICD-10-CM

## 2023-12-07 DIAGNOSIS — C50912 Malignant neoplasm of unspecified site of left female breast: Secondary | ICD-10-CM

## 2023-12-07 DIAGNOSIS — C50212 Malignant neoplasm of upper-inner quadrant of left female breast: Secondary | ICD-10-CM

## 2023-12-07 NOTE — Progress Notes (Signed)
 Specialty Pharmacy Refill Coordination Note  Stacey [name] is a 83 y.o. female contacted today regarding refills of specialty medication(s) Palbociclib  (IBRANCE )   Patient requested (Patient-Rptd) Delivery   Delivery date: 12/14/2023 Verified address: (Patient-Rptd) [address], [place]   Medication will be filled on: 12/13/2023

## 2023-12-08 DIAGNOSIS — H40013 Open angle with borderline findings, low risk, bilateral: Secondary | ICD-10-CM | POA: Diagnosis not present

## 2023-12-08 DIAGNOSIS — H40042 Steroid responder, left eye: Secondary | ICD-10-CM | POA: Diagnosis not present

## 2023-12-08 LAB — OPHTHALMOLOGY REPORT-SCANNED

## 2023-12-10 ENCOUNTER — Inpatient Hospital Stay

## 2023-12-10 ENCOUNTER — Other Ambulatory Visit: Payer: Self-pay | Admitting: Adult Health

## 2023-12-10 ENCOUNTER — Inpatient Hospital Stay: Attending: Adult Health | Admitting: Hematology and Oncology

## 2023-12-10 DIAGNOSIS — I130 Hypertensive heart and chronic kidney disease with heart failure and stage 1 through stage 4 chronic kidney disease, or unspecified chronic kidney disease: Secondary | ICD-10-CM | POA: Insufficient documentation

## 2023-12-10 DIAGNOSIS — Z170 Estrogen receptor positive status [ER+]: Secondary | ICD-10-CM | POA: Diagnosis not present

## 2023-12-10 DIAGNOSIS — C7951 Secondary malignant neoplasm of bone: Secondary | ICD-10-CM | POA: Diagnosis not present

## 2023-12-10 DIAGNOSIS — K909 Intestinal malabsorption, unspecified: Secondary | ICD-10-CM | POA: Diagnosis not present

## 2023-12-10 DIAGNOSIS — D649 Anemia, unspecified: Secondary | ICD-10-CM | POA: Diagnosis not present

## 2023-12-10 DIAGNOSIS — M8580 Other specified disorders of bone density and structure, unspecified site: Secondary | ICD-10-CM | POA: Insufficient documentation

## 2023-12-10 DIAGNOSIS — C50212 Malignant neoplasm of upper-inner quadrant of left female breast: Secondary | ICD-10-CM

## 2023-12-10 DIAGNOSIS — C50211 Malignant neoplasm of upper-inner quadrant of right female breast: Secondary | ICD-10-CM | POA: Insufficient documentation

## 2023-12-10 DIAGNOSIS — Z806 Family history of leukemia: Secondary | ICD-10-CM | POA: Insufficient documentation

## 2023-12-10 DIAGNOSIS — Z86718 Personal history of other venous thrombosis and embolism: Secondary | ICD-10-CM | POA: Diagnosis not present

## 2023-12-10 DIAGNOSIS — Z79818 Long term (current) use of other agents affecting estrogen receptors and estrogen levels: Secondary | ICD-10-CM | POA: Insufficient documentation

## 2023-12-10 DIAGNOSIS — K529 Noninfective gastroenteritis and colitis, unspecified: Secondary | ICD-10-CM | POA: Diagnosis not present

## 2023-12-10 DIAGNOSIS — E785 Hyperlipidemia, unspecified: Secondary | ICD-10-CM | POA: Insufficient documentation

## 2023-12-10 DIAGNOSIS — I471 Supraventricular tachycardia, unspecified: Secondary | ICD-10-CM | POA: Insufficient documentation

## 2023-12-10 DIAGNOSIS — D630 Anemia in neoplastic disease: Secondary | ICD-10-CM | POA: Diagnosis not present

## 2023-12-10 DIAGNOSIS — M19049 Primary osteoarthritis, unspecified hand: Secondary | ICD-10-CM | POA: Diagnosis not present

## 2023-12-10 DIAGNOSIS — R197 Diarrhea, unspecified: Secondary | ICD-10-CM | POA: Diagnosis not present

## 2023-12-10 DIAGNOSIS — Z87891 Personal history of nicotine dependence: Secondary | ICD-10-CM | POA: Insufficient documentation

## 2023-12-10 DIAGNOSIS — Z923 Personal history of irradiation: Secondary | ICD-10-CM | POA: Diagnosis not present

## 2023-12-10 DIAGNOSIS — Z800 Family history of malignant neoplasm of digestive organs: Secondary | ICD-10-CM | POA: Diagnosis not present

## 2023-12-10 DIAGNOSIS — Z79811 Long term (current) use of aromatase inhibitors: Secondary | ICD-10-CM | POA: Diagnosis not present

## 2023-12-10 DIAGNOSIS — Z7952 Long term (current) use of systemic steroids: Secondary | ICD-10-CM | POA: Diagnosis not present

## 2023-12-10 DIAGNOSIS — C50912 Malignant neoplasm of unspecified site of left female breast: Secondary | ICD-10-CM

## 2023-12-10 DIAGNOSIS — I890 Lymphedema, not elsewhere classified: Secondary | ICD-10-CM | POA: Insufficient documentation

## 2023-12-10 DIAGNOSIS — Z1721 Progesterone receptor positive status: Secondary | ICD-10-CM | POA: Diagnosis not present

## 2023-12-10 DIAGNOSIS — Z9049 Acquired absence of other specified parts of digestive tract: Secondary | ICD-10-CM | POA: Diagnosis not present

## 2023-12-10 DIAGNOSIS — Z803 Family history of malignant neoplasm of breast: Secondary | ICD-10-CM | POA: Insufficient documentation

## 2023-12-10 DIAGNOSIS — I5033 Acute on chronic diastolic (congestive) heart failure: Secondary | ICD-10-CM | POA: Insufficient documentation

## 2023-12-10 DIAGNOSIS — Z801 Family history of malignant neoplasm of trachea, bronchus and lung: Secondary | ICD-10-CM | POA: Insufficient documentation

## 2023-12-10 LAB — CBC WITH DIFFERENTIAL (CANCER CENTER ONLY)
Abs Immature Granulocytes: 0.01 K/uL (ref 0.00–0.07)
Basophils Absolute: 0 K/uL (ref 0.0–0.1)
Basophils Relative: 1 %
Eosinophils Absolute: 0.1 K/uL (ref 0.0–0.5)
Eosinophils Relative: 2 %
HCT: 30.6 % — ABNORMAL LOW (ref 36.0–46.0)
Hemoglobin: 10.6 g/dL — ABNORMAL LOW (ref 12.0–15.0)
Immature Granulocytes: 0 %
Lymphocytes Relative: 19 %
Lymphs Abs: 0.7 K/uL (ref 0.7–4.0)
MCH: 34.5 pg — ABNORMAL HIGH (ref 26.0–34.0)
MCHC: 34.6 g/dL (ref 30.0–36.0)
MCV: 99.7 fL (ref 80.0–100.0)
Monocytes Absolute: 0.2 K/uL (ref 0.1–1.0)
Monocytes Relative: 6 %
Neutro Abs: 2.5 K/uL (ref 1.7–7.7)
Neutrophils Relative %: 72 %
Platelet Count: 163 K/uL (ref 150–400)
RBC: 3.07 MIL/uL — ABNORMAL LOW (ref 3.87–5.11)
RDW: 13.9 % (ref 11.5–15.5)
Smear Review: NORMAL
WBC Count: 3.5 K/uL — ABNORMAL LOW (ref 4.0–10.5)
nRBC: 0 % (ref 0.0–0.2)

## 2023-12-10 LAB — CMP (CANCER CENTER ONLY)
ALT: 8 U/L (ref 0–44)
AST: 18 U/L (ref 15–41)
Albumin: 4.5 g/dL (ref 3.5–5.0)
Alkaline Phosphatase: 70 U/L (ref 38–126)
Anion gap: 10 (ref 5–15)
BUN: 26 mg/dL — ABNORMAL HIGH (ref 8–23)
CO2: 21 mmol/L — ABNORMAL LOW (ref 22–32)
Calcium: 9.5 mg/dL (ref 8.9–10.3)
Chloride: 109 mmol/L (ref 98–111)
Creatinine: 1.5 mg/dL — ABNORMAL HIGH (ref 0.44–1.00)
GFR, Estimated: 34 mL/min — ABNORMAL LOW (ref >60)
Glucose, Bld: 101 mg/dL — ABNORMAL HIGH (ref 70–99)
Potassium: 4.1 mmol/L (ref 3.5–5.1)
Sodium: 140 mmol/L (ref 135–145)
Total Bilirubin: 0.3 mg/dL (ref 0.0–1.2)
Total Protein: 7.9 g/dL (ref 6.5–8.1)

## 2023-12-10 LAB — MAGNESIUM: Magnesium: 1.8 mg/dL (ref 1.7–2.4)

## 2023-12-10 MED ORDER — MAGNESIUM SULFATE 2 GM/50ML IV SOLN: 2.0000 g | Freq: Once | INTRAVENOUS | Status: DC

## 2023-12-10 MED ORDER — FULVESTRANT 250 MG/5ML IM SOSY
500.0000 mg | PREFILLED_SYRINGE | Freq: Once | INTRAMUSCULAR | Status: AC
  Administered 2023-12-10: 500 mg via INTRAMUSCULAR
  Filled 2023-12-10: qty 10

## 2023-12-10 NOTE — Progress Notes (Signed)
 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   #2 status post 4 cycles of doxorubicin and cyclophosphamide,                          (i) followed by radiation therapy under the care of Dr. Jadine.   #3 received tamoxifen  for a total of seven years    RIGHT BREAST #4  S/P biopsy of the RIGHT breast upper inner quadrant on 11/14/10 showing invasive ductal carcinoma,, grade 2, estrogen receptor 85% and progesterone receptor 57% positive, Ki67 20%, HER2 not amplified.     #5 started neoadjuvant letrozole  in November 2012; switched to tamoxifen  as of February 2016 due to osteopenia concerns   #6  S/P right lumpectomy with sentinel lymph node biopsy on 09/03/11 for a ypT2, ypN1a, stage IIB invasive lobular carcinoma, grade 2,estrogen receptor 97% positive, progesterone receptor 12% positive, with no HER-2 amplification.   #7 status post right breast radiation therapy under the care of Dr. Patrcia from 10/19/2011 to 12/03/2011.    #8 did not meet criteria for genetic testing according to her insurance company.   #9 osteopenia, with a T score of -1.8 on DEXA scan at [facility] 03/07/2013             (a) status post multiple dental extractions and implants             (b) repeat bone density at [facility] 04/02/2015 shows a T score of -2.0   METASTATIC DISEASE: April 2021 (lymph nodes, bone)   #10:  left upper extremity lymphedema led to chest CT scan 05/09/2019 showing a 5.1 cm left subpectoral chest wall mass and thoracic lymphadenopathy              (a) CT biopsy of the chest wall mass 05/25/2019 confirms recurrent breast cancer, strongly estrogen and  progesterone receptor positive, HER-2 not amplified             (b) PET scan 05/30/2019 shows a left subpectoral mass measuring 5.4 cm, with significant regional and mediastinal adenopathy, sclerotic left scapular metastasis, but no liver or lung involvement.             (c) CA 27-29 is moderately informative   #11 anastrozole  started 06/02/2019; palbociclib  added 06/08/2019             (a) palbociclib  taken irregularly for several months secondary to cytopenias             (b) palbociclib  dose decreased to 100 mg daily 21 on 7 off October 2021             (c) CT of the chest with contrast 11/09/2019 shows mild disease progression (despite a continuing drop in the CA 27-29)             (d) fulvestrant  added 11/23/2019             (e) repeat chest CT with contrast 09/09/2020 read as stable.   #12 denosumab /Xgeva  starting 06/08/2019, to be repeated every 3 months due to hypocalcemia             (  a) treatment held after August 2022 dose secondary to ongoing dental issues   #13 genetics testing 06/15/2019 through the [facility] Common Hereditary Cancers Panel found no deleterious mutations in APC, ATM, AXIN2, BARD1, BMPR1A, BRCA1, BRCA2, BRIP1, CDH1, CDKN2A (p14ARF), CDKN2A (p16INK4a), CKD4, CHEK2, CTNNA1, DICER1, EPCAM (Deletion/duplication testing only), GREM1 (promoter region deletion/duplication testing only), KIT, MEN1, MLH1, MSH2, MSH3, MSH6, MUTYH, NBN, NF1, NHTL1, PALB2, PDGFRA, PMS2, POLD1, POLE, PTEN, RAD50, RAD51C, RAD51D, RNF43, SDHB, SDHC, SDHD, SMAD4, SMARCA4. STK11, TP53, TSC1, TSC2, and VHL.  The following genes were evaluated for sequence changes only: SDHA and HOXB13 c.251G>A variant only.  CURRENT THERAPY: Anastrozole , Fulvestrant , Palbociclib   INTERVAL HISTORY:  Discussed the use of AI scribe software for clinical note transcription with the patient, who gave verbal consent to proceed.  History of Present Illness  Stacey [name] is an 83 year old female with met breast cancer  currently on Ibrance  and faslodex  who presents for follow-up regarding her ongoing treatment.  Recent lymph node analysis showed estrogen receptor positivity with weak staining, progesterone receptor was 50% positive with moderate to strong staining, and HER2 was negative. The growth rate was low to intermediate.  She experiences ongoing diarrhea that occurs daily, though it is less severe than previously. The diarrhea begins about an hour after eating and can last for a couple of hours, eventually reducing to a 'dripping' state. She manages the symptoms with Imodium , especially when she needs to leave the house.  She is currently on Faslodex  and Ibrance  for her cancer treatment. Her magnesium  levels have improved , she takes 500 mg daily.  She experiences shortness of breath, which she feels is gradually worsening. She attributes this to her medication, Ibrance . She uses a wheelchair for long distances due to breathlessness but can manage shorter distances with the aid of a shopping cart.  She has a history of anemia, with a current hemoglobin level of 10.6. She has been anemic for most of her life and recalls almost losing a job due to low hemoglobin levels in the past. She does not believe her current anemia is severe enough to cause significant symptoms.  No new pain in the abdomen except when eating, which she describes as feeling the food going down.  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) 09/24/2023   Aortic atherosclerosis 08/10/2023   Diastolic CHF (HCC) 02/09/2023   CKD (chronic kidney disease), stage III (HCC) 01/12/2023   Orthostatic hypotension 01/12/2023   Acute on chronic diastolic heart failure (HCC) 12/26/2022   Hypokalemia 12/26/2022   Acute on chronic hypoxic respiratory failure (HCC) 12/05/2022   Metabolic acidosis 12/04/2022   Hypocalcemia  12/04/2022   Fatigue 12/04/2022   Pneumonia 12/04/2022   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   DVT (deep venous thrombosis) (HCC) 12/04/2022   Diarrhea 12/04/2022   Sepsis (HCC) 11/17/2022   History of DVT (deep vein thrombosis) 11/17/2022   Current chronic use of systemic steroids 11/17/2022   Tachypnea 11/17/2022   Pulmonary nodule 11/17/2022   Acute deep vein thrombosis (DVT) of femoral vein of right lower extremity (HCC) 04/13/2022   Nasal dryness 02/04/2021   Chronic sinusitis 11/25/2020   Allergic rhinitis 07/08/2020   Hypomagnesemia 06/10/2020   Goals of care, counseling/discussion 06/21/2019   Family history of breast cancer    Family history of pancreatic cancer    Family history  of uterine cancer    Family history of lymphoma    Family history of lung cancer    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HCC) 06/02/2019   Recurrent cancer of left breast (HCC) 05/15/2019   Pes anserine bursitis 03/31/2019   HTN (hypertension) 02/10/2019   Hyperglycemia 06/21/2018   Left carpal tunnel syndrome 02/22/2018   Rotator cuff arthropathy of left shoulder 09/20/2017   Arthritis of hand 08/23/2017   Pain in right hand 08/23/2017   Cervical radiculitis 04/09/2017   Left shoulder pain 04/09/2017   DOE (dyspnea on exertion) 05/14/2016   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   Lymphedema of left arm 01/14/2016   Left arm swelling 12/25/2015   SVT (supraventricular tachycardia)    Left-sided carotid artery disease 03/08/2015   Dizziness 01/24/2015   Urinary frequency 01/13/2015   Dizziness and giddiness 01/09/2015   Gallstones 02/21/2013   Malignant neoplasm of upper-inner quadrant of left breast in female, estrogen receptor positive (HCC) 11/28/2012   Muscle cramping 09/12/2012   Right hip pain 09/12/2012   Lower back pain 09/12/2012   COPD GOLD I     Hx of radiation therapy    Right shoulder pain 09/14/2011   Preventative health  care 07/29/2010   Skin lesion of left leg 07/29/2010   Paresthesia 07/29/2010   GERD 03/28/2010   Constipation 03/28/2010   Osteoarthrosis, 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   Anxiety state 05/03/2009   Hyperlipidemia 06/14/2007   FATIGUE 06/14/2007   Anemia 12/17/2006   Diverticulosis of colon 12/17/2006   Cough 12/17/2006   IRRITABLE BOWEL SYNDROME, HX OF 12/17/2006    is allergic to other, tape, aminoglycosides, bacitracin, bee venom, cephalexin , clindamycin/lincomycin, codeine, and nasacort  [triamcinolone ].  MEDICAL HISTORY: Past Medical History:  Diagnosis Date   ANEMIA-NOS    ANXIETY    Blood transfusion without reported diagnosis Nov. 2024   Breast cancer (HCC) 1997 L, 2012 R   s/p chemo/xrt   Cataract 2015   CHF (congestive heart failure) (HCC) Nov. 2024   COPD    resolved   DIVERTICULOSIS, COLON 2008   Dizziness    Emphysema of lung (HCC) NA   Family history of breast cancer    Family history of lung cancer    Family history of lymphoma    Family history of pancreatic cancer    Family history of uterine cancer    GERD    Hx of radiation therapy 10/19/11 -12/03/11   right breast   HYPERLIPIDEMIA    IRRITABLE BOWEL SYNDROME, HX OF    Left-sided carotid artery disease    moderate left ICA stenosis   OSTEOARTHRITIS, HAND    PSVT (paroxysmal supraventricular tachycardia)    symptomatic on event monitor    SURGICAL HISTORY: Past Surgical History:  Procedure Laterality Date   ABDOMINAL HYSTERECTOMY     APPENDECTOMY     BREAST BIOPSY  11/14/2010   r breast: inv, insitu mammary carcinoma w/calcif, er/pr +, her2 -   BREAST SURGERY     lumpectomy   CATARACT EXTRACTION     both eyes   COSMETIC SURGERY  1989   ELECTROPHYSIOLOGIC STUDY N/A 05/10/2015   Procedure: SVT Ablation;  Surgeon: Will Gladis Norton, MD;  Location: [facility]AS[facility];  Service: Cardiovascular;  Laterality: N/A;   HERNIA REPAIR     inguinal herniorrhapy  02/02/1982   left   IR US  GUIDE BX ASP/DRAIN   05/26/2019   rectal fissure repair     s/p benign  breast biopsy  02/02/2001   right   s/p left foot surgury  02/03/2007   s/p lumpectomy  02/03/1995   melignant left x 2   spiral fx left foot  02/02/2006   no surgury   TMJ ARTHROPLASTY  02/03/1987   TONGUE SURGERY     1988- to remove scar tissue growth    TONSILLECTOMY      SOCIAL HISTORY: Social History   Socioeconomic History   Marital status: Divorced    Spouse name: 2 Step-children   Number of children: 2   Years of education: Not on file   Highest education level: Bachelor's degree (e.g., BA, AB, BS)  Occupational History   Occupation: retired training and development officer  Tobacco Use   Smoking status: Former    Current packs/day: 0.00    Average packs/day: 1 pack/day for 54.0 years (54.0 ttl pk-yrs)    Types: Cigarettes    Start date: 01/02/1962    Quit date: 01/03/2016    Years since quitting: 7.9   Smokeless tobacco: Never  Vaping Use   Vaping status: Never Used  Substance and Sexual Activity   Alcohol use: Yes    Comment: rare/ drinks socially   Drug use: No   Sexual activity: Never  Other Topics Concern   Not on file  Social History Narrative   Patient gets no regular exercise   No biological children   2 step children   Social Drivers of Corporate Investment Banker Strain: Low Risk  (08/06/2023)   Overall Financial Resource Strain (CARDIA)    Difficulty of Paying Living Expenses: Not hard at all  Food Insecurity: No Food Insecurity (08/06/2023)   Hunger Vital Sign    Worried About Running Out of Food in the Last Year: Never true    Ran Out of Food in the Last Year: Never true  Transportation Needs: No Transportation Needs (08/06/2023)   PRAPARE - Administrator, Civil Service (Medical): No    Lack of Transportation (Non-Medical): No  Physical Activity: Inactive (08/06/2023)   Exercise Vital Sign    Days of Exercise per Week: 0 days    Minutes of Exercise per Session: Not on file  Stress: No Stress Concern  Present (08/06/2023)   [employer] of Occupational Health - Occupational Stress Questionnaire    Feeling of Stress: Not at all  Social Connections: Socially Isolated (08/06/2023)   Social Connection and Isolation Panel    Frequency of Communication with Friends and Family: More than three times a week    Frequency of Social Gatherings with Friends and Family: Patient declined    Attends Religious Services: Never    Database Administrator or Organizations: No    Attends Engineer, Structural: Not on file    Marital Status: Divorced  Intimate Partner Violence: Not At Risk (01/11/2023)   Humiliation, Afraid, Rape, and Kick questionnaire    Fear of Current or Ex-Partner: No    Emotionally Abused: No    Physically Abused: No    Sexually Abused: No    FAMILY HISTORY: Family History  Problem Relation Age of Onset   Hypertension Mother    Stroke Mother    Colon polyps Mother    Diabetes Mother    Pancreatic cancer Mother 63   Uterine cancer Mother 52   Cancer Mother    Lymphoma Brother        burkitts   Cancer Brother    Lung cancer Paternal Uncle  Lung cancer Maternal Grandmother 86       non-smoker   Lung cancer Maternal Grandfather    Breast cancer Cousin        maternal cousin, dx in her mid 63s   Brain cancer Cousin        maternal cousin's son; dx in his 88s   Testicular cancer Cousin        maternal cousin's son;    Breast cancer Cousin        paternal cousin; dx in her 73s   Alcohol abuse Father    Breast cancer Cousin        paternal cousin's daughter; dx in 71s; neg genetic testing   Asthma Brother    Asthma Brother    Colon cancer Neg Hx    Esophageal cancer Neg Hx    Stomach cancer Neg Hx    Rectal cancer Neg Hx    PHYSICAL EXAMINATION   Onc Performance Status - 12/10/23 1400       KPS SCALE   KPS % SCORE Normal activity with effort, some s/s of disease         There were no vitals filed for this visit.   She appears well, no acute  distress No palpable cervical or supraclavicular adenopathy CTA bilaterally RRR No LE edema.  LABORATORY DATA:  CBC    Component Value Date/Time   WBC 3.5 (L) 12/10/2023 1316   WBC 3.6 (L) 06/23/2023 1530   RBC 3.07 (L) 12/10/2023 1316   HGB 10.6 (L) 12/10/2023 1316   HGB 12.4 12/09/2015 1408   HCT 30.6 (L) 12/10/2023 1316   HCT 25.8 (L) 07/03/2022 1345   HCT 36.2 12/09/2015 1408   PLT 163 12/10/2023 1316   PLT 238 12/09/2015 1408   MCV 99.7 12/10/2023 1316   MCV 89.8 12/09/2015 1408   MCH 34.5 (H) 12/10/2023 1316   MCHC 34.6 12/10/2023 1316   RDW 13.9 12/10/2023 1316   RDW 13.2 12/09/2015 1408   LYMPHSABS 0.7 12/10/2023 1316   LYMPHSABS 2.1 12/09/2015 1408   MONOABS 0.2 12/10/2023 1316   MONOABS 0.5 12/09/2015 1408   EOSABS 0.1 12/10/2023 1316   EOSABS 0.2 12/09/2015 1408   BASOSABS 0.0 12/10/2023 1316   BASOSABS 0.0 12/09/2015 1408    CMP     Component Value Date/Time   NA 140 12/10/2023 1316   NA 140 12/09/2015 1409   K 4.1 12/10/2023 1316   K 4.2 12/09/2015 1409   CL 109 12/10/2023 1316   CL 107 02/02/2012 0939   CO2 21 (L) 12/10/2023 1316   CO2 25 12/09/2015 1409   GLUCOSE 101 (H) 12/10/2023 1316   GLUCOSE 102 12/09/2015 1409   GLUCOSE 104 (H) 02/02/2012 0939   BUN 26 (H) 12/10/2023 1316   BUN 16.7 12/09/2015 1409   CREATININE 1.50 (H) 12/10/2023 1316   CREATININE 0.8 12/09/2015 1409   CALCIUM  9.5 12/10/2023 1316   CALCIUM  9.4 12/09/2015 1409   PROT 7.9 12/10/2023 1316   PROT 7.0 12/09/2015 1409   ALBUMIN 4.5 12/10/2023 1316   ALBUMIN 3.3 (L) 12/09/2015 1409   AST 18 12/10/2023 1316   AST 20 12/09/2015 1409   ALT 8 12/10/2023 1316   ALT 13 12/09/2015 1409   ALKPHOS 70 12/10/2023 1316   ALKPHOS 54 12/09/2015 1409   BILITOT 0.3 12/10/2023 1316   BILITOT <0.22 12/09/2015 1409   GFRNONAA 34 (L) 12/10/2023 1316   GFRAA >60 10/26/2019 1347   GFRAA >60 05/09/2019 1303  ASSESSMENT and THERAPY PLAN:   Assessment and Plan Assessment &  Plan Metastatic breast cancer with ESR1 and PIK3CA mutations ER+ with weak estrogen receptor staining, PR+ with moderate to strong staining, HER2-. Low to intermediate growth rate.   - She had an isolated area of growth in the left supraclavicular LN. She had palliative radiation to this area. - Continue Ibrance  75 mg. - Continue anastrozole . - Continue Faslodex .  Metastatic breast cancer with ESR1 and PIK3CA mutations Tumor marker decreased from 49 to 41.6. Awaiting PET scan results. Potential for alternative therapies if Ibrance  becomes ineffective or causes side effects. - Will review PET scan results and adjust treatment plan if necessary.  Chronic diarrhea Persistent daily diarrhea managed with Imodium . No change in severity. - Continue Imodium  as needed.  Anemia, unspecified Hemoglobin at 10.6. Anemia not severe or symptomatic. No fluid overload or cardiac issues to explain SOB Ok to monitor.  Shortness of breath Gradual onset, possibly medication-related. No significant cardiac or pulmonary issues. Deconditioning unlikely primary cause. - Will review CT portion of PET scan for lung changes.  Hypomagnesemia, controlled secondary to diarrhea from Ibrance  Magnesium  levels controlled with supplementation. Recent level at 1.8. - Continue magnesium  supplementation at 500 mg daily.  All questions were answered. The patient knows to call the clinic with any problems, questions or concerns. We can certainly see the patient much sooner if necessary.  Total encounter time:30 minutes*in face-to-face visit time, chart review, lab review, care coordination, order entry, and documentation of the encounter time.   *Total Encounter Time as defined by the Centers for Medicare and Medicaid Services includes, in addition to the face-to-face time of a patient visit (documented in the note above) non-face-to-face time: obtaining and reviewing outside history, ordering and reviewing medications,  tests or procedures, care coordination (communications with other health care professionals or caregivers) and documentation in the medical record.

## 2023-12-11 LAB — CANCER ANTIGEN 27.29: CA 27.29: 43.3 U/mL — ABNORMAL HIGH (ref 0.0–38.6)

## 2023-12-13 ENCOUNTER — Other Ambulatory Visit: Payer: Self-pay

## 2023-12-21 ENCOUNTER — Encounter (HOSPITAL_COMMUNITY)
Admission: RE | Admit: 2023-12-21 | Discharge: 2023-12-21 | Disposition: A | Discharge status: Home / Self Care | Source: Ambulatory Visit | Attending: Hematology and Oncology | Admitting: Hematology and Oncology

## 2023-12-21 DIAGNOSIS — C7951 Secondary malignant neoplasm of bone: Secondary | ICD-10-CM | POA: Insufficient documentation

## 2023-12-21 DIAGNOSIS — Z170 Estrogen receptor positive status [ER+]: Secondary | ICD-10-CM | POA: Insufficient documentation

## 2023-12-21 DIAGNOSIS — C50912 Malignant neoplasm of unspecified site of left female breast: Secondary | ICD-10-CM | POA: Insufficient documentation

## 2023-12-21 DIAGNOSIS — C50212 Malignant neoplasm of upper-inner quadrant of left female breast: Secondary | ICD-10-CM | POA: Diagnosis not present

## 2023-12-21 DIAGNOSIS — C50919 Malignant neoplasm of unspecified site of unspecified female breast: Secondary | ICD-10-CM | POA: Diagnosis not present

## 2023-12-21 DIAGNOSIS — K449 Diaphragmatic hernia without obstruction or gangrene: Secondary | ICD-10-CM | POA: Diagnosis not present

## 2023-12-21 LAB — GLUCOSE, CAPILLARY: Glucose-Capillary: 106 mg/dL — ABNORMAL HIGH (ref 70–99)

## 2023-12-21 MED ORDER — FLUDEOXYGLUCOSE F - 18 (FDG) INJECTION
6.9600 | Freq: Once | INTRAVENOUS | Status: AC | PRN
  Administered 2023-12-21: 6.96 via INTRAVENOUS

## 2023-12-24 ENCOUNTER — Inpatient Hospital Stay (HOSPITAL_BASED_OUTPATIENT_CLINIC_OR_DEPARTMENT_OTHER): Admitting: Hematology and Oncology

## 2023-12-24 DIAGNOSIS — M8580 Other specified disorders of bone density and structure, unspecified site: Secondary | ICD-10-CM | POA: Diagnosis not present

## 2023-12-24 DIAGNOSIS — Z79811 Long term (current) use of aromatase inhibitors: Secondary | ICD-10-CM | POA: Diagnosis not present

## 2023-12-24 DIAGNOSIS — Z1721 Progesterone receptor positive status: Secondary | ICD-10-CM | POA: Diagnosis not present

## 2023-12-24 DIAGNOSIS — C50211 Malignant neoplasm of upper-inner quadrant of right female breast: Secondary | ICD-10-CM | POA: Diagnosis not present

## 2023-12-24 DIAGNOSIS — Z170 Estrogen receptor positive status [ER+]: Secondary | ICD-10-CM | POA: Diagnosis not present

## 2023-12-24 DIAGNOSIS — C7951 Secondary malignant neoplasm of bone: Secondary | ICD-10-CM | POA: Diagnosis not present

## 2023-12-24 DIAGNOSIS — Z79818 Long term (current) use of other agents affecting estrogen receptors and estrogen levels: Secondary | ICD-10-CM | POA: Diagnosis not present

## 2023-12-24 NOTE — Progress Notes (Signed)
 [facility] Cancer Follow up:    Stacey [name] ORN, MD 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   #2 status post 4 cycles of doxorubicin and cyclophosphamide,                          (i) followed by radiation therapy under the care of Dr. Jadine.   #3 received tamoxifen  for a total of seven years    RIGHT BREAST #4  S/P biopsy of the RIGHT breast upper inner quadrant on 11/14/10 showing invasive ductal carcinoma,, grade 2, estrogen receptor 85% and progesterone receptor 57% positive, Ki67 20%, HER2 not amplified.     #5 started neoadjuvant letrozole  in November 2012; switched to tamoxifen  as of February 2016 due to osteopenia concerns   #6  S/P right lumpectomy with sentinel lymph node biopsy on 09/03/11 for a ypT2, ypN1a, stage IIB invasive lobular carcinoma, grade 2,estrogen receptor 97% positive, progesterone receptor 12% positive, with no HER-2 amplification.   #7 status post right breast radiation therapy under the care of Dr. Patrcia from 10/19/2011 to 12/03/2011.    #8 did not meet criteria for genetic testing according to her insurance company.   #9 osteopenia, with a T score of -1.8 on DEXA scan at [facility] 03/07/2013             (a) status post multiple dental extractions and implants             (b) repeat bone density at [facility] 04/02/2015 shows a T score of -2.0   METASTATIC DISEASE: April 2021 (lymph nodes, bone)   #10:  left upper extremity lymphedema led to chest CT scan 05/09/2019 showing a 5.1 cm left subpectoral chest wall mass and thoracic lymphadenopathy              (a) CT biopsy of the chest wall mass 05/25/2019 confirms recurrent breast cancer, strongly estrogen and  progesterone receptor positive, HER-2 not amplified             (b) PET scan 05/30/2019 shows a left subpectoral mass measuring 5.4 cm, with significant regional and mediastinal adenopathy, sclerotic left scapular metastasis, but no liver or lung involvement.             (c) CA 27-29 is moderately informative   #11 anastrozole  started 06/02/2019; palbociclib  added 06/08/2019             (a) palbociclib  taken irregularly for several months secondary to cytopenias             (b) palbociclib  dose decreased to 100 mg daily 21 on 7 off October 2021             (c) CT of the chest with contrast 11/09/2019 shows mild disease progression (despite a continuing drop in the CA 27-29)             (d) fulvestrant  added 11/23/2019             (e) repeat chest CT with contrast 09/09/2020 read as stable.   #12 denosumab /Xgeva  starting 06/08/2019, to be repeated every 3 months due to hypocalcemia             (  a) treatment held after August 2022 dose secondary to ongoing dental issues   #13 genetics testing 06/15/2019 through the [facility] Common Hereditary Cancers Panel found no deleterious mutations in APC, ATM, AXIN2, BARD1, BMPR1A, BRCA1, BRCA2, BRIP1, CDH1, CDKN2A (p14ARF), CDKN2A (p16INK4a), CKD4, CHEK2, CTNNA1, DICER1, EPCAM (Deletion/duplication testing only), GREM1 (promoter region deletion/duplication testing only), KIT, MEN1, MLH1, MSH2, MSH3, MSH6, MUTYH, NBN, NF1, NHTL1, PALB2, PDGFRA, PMS2, POLD1, POLE, PTEN, RAD50, RAD51C, RAD51D, RNF43, SDHB, SDHC, SDHD, SMAD4, SMARCA4. STK11, TP53, TSC1, TSC2, and VHL.  The following genes were evaluated for sequence changes only: SDHA and HOXB13 c.251G>A variant only.  CURRENT THERAPY: Anastrozole , Fulvestrant , Palbociclib   INTERVAL HISTORY:  Discussed the use of AI scribe software for clinical note transcription with the patient, who gave verbal consent to proceed.  History of Present Illness  Stacey [name] is an 83 year old female with met breast cancer  currently on Ibrance  and faslodex  who presents for follow-up regarding her ongoing treatment.  Recent lymph node analysis showed estrogen receptor positivity with weak staining, progesterone receptor was 50% positive with moderate to strong staining, and HER2 was negative. The growth rate was low to intermediate.  She underwent radiation to the supraclavicular node and is here for telephone visit to review PET results.  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) 09/24/2023   Aortic atherosclerosis 08/10/2023   Diastolic CHF (HCC) 02/09/2023   CKD (chronic kidney disease), stage III (HCC) 01/12/2023   Orthostatic hypotension 01/12/2023   Acute on chronic diastolic heart failure (HCC) 12/26/2022   Hypokalemia 12/26/2022   Acute on chronic hypoxic respiratory failure (HCC) 12/05/2022   Metabolic acidosis 12/04/2022   Hypocalcemia 12/04/2022   Fatigue 12/04/2022   Pneumonia 12/04/2022   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   DVT (deep venous thrombosis) (HCC) 12/04/2022   Diarrhea 12/04/2022   Sepsis (HCC) 11/17/2022   History of DVT (deep vein thrombosis) 11/17/2022   Current chronic use of systemic steroids 11/17/2022   Tachypnea 11/17/2022   Pulmonary nodule 11/17/2022   Acute deep vein thrombosis (DVT) of femoral vein of right lower extremity (HCC) 04/13/2022   Nasal dryness 02/04/2021   Chronic sinusitis 11/25/2020   Allergic rhinitis 07/08/2020   Hypomagnesemia 06/10/2020   Goals of care, counseling/discussion 06/21/2019   Family history of breast cancer    Family history of pancreatic cancer    Family history of uterine cancer    Family history of lymphoma    Family history of lung cancer    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HCC) 06/02/2019   Recurrent cancer of left breast (HCC) 05/15/2019   Pes anserine bursitis  03/31/2019   HTN (hypertension) 02/10/2019   Hyperglycemia 06/21/2018   Left carpal tunnel syndrome 02/22/2018   Rotator cuff arthropathy of left shoulder 09/20/2017   Arthritis of hand 08/23/2017   Pain in right hand 08/23/2017   Cervical radiculitis 04/09/2017   Left shoulder pain 04/09/2017   DOE (dyspnea on exertion) 05/14/2016   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   Lymphedema of left arm 01/14/2016   Left arm swelling 12/25/2015   SVT (supraventricular tachycardia)    Left-sided carotid artery disease 03/08/2015   Dizziness 01/24/2015   Urinary frequency 01/13/2015   Dizziness and giddiness 01/09/2015   Gallstones 02/21/2013   Malignant neoplasm of upper-inner quadrant of left breast in  female, estrogen receptor positive (HCC) 11/28/2012   Muscle cramping 09/12/2012   Right hip pain 09/12/2012   Lower back pain 09/12/2012   COPD GOLD I     Hx of radiation therapy    Right shoulder pain 09/14/2011   Preventative health care 07/29/2010   Skin lesion of left leg 07/29/2010   Paresthesia 07/29/2010   GERD 03/28/2010   Constipation 03/28/2010   Osteoarthrosis, 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   Anxiety state 05/03/2009   Hyperlipidemia 06/14/2007   FATIGUE 06/14/2007   Anemia 12/17/2006   Diverticulosis of colon 12/17/2006   Cough 12/17/2006   IRRITABLE BOWEL SYNDROME, HX OF 12/17/2006    is allergic to other, tape, aminoglycosides, bacitracin, bee venom, cephalexin , clindamycin/lincomycin, codeine, and nasacort  [triamcinolone ].  MEDICAL HISTORY: Past Medical History:  Diagnosis Date   ANEMIA-NOS    ANXIETY    Blood transfusion without reported diagnosis Nov. 2024   Breast cancer (HCC) 1997 L, 2012 R   s/p chemo/xrt   Cataract 2015   CHF (congestive heart failure) (HCC) Nov. 2024   COPD    resolved   DIVERTICULOSIS, COLON 2008   Dizziness    Emphysema of lung (HCC) NA   Family history of breast cancer    Family history of lung cancer    Family  history of lymphoma    Family history of pancreatic cancer    Family history of uterine cancer    GERD    Hx of radiation therapy 10/19/11 -12/03/11   right breast   HYPERLIPIDEMIA    IRRITABLE BOWEL SYNDROME, HX OF    Left-sided carotid artery disease    moderate left ICA stenosis   OSTEOARTHRITIS, HAND    PSVT (paroxysmal supraventricular tachycardia)    symptomatic on event monitor    SURGICAL HISTORY: Past Surgical History:  Procedure Laterality Date   ABDOMINAL HYSTERECTOMY     APPENDECTOMY     BREAST BIOPSY  11/14/2010   r breast: inv, insitu mammary carcinoma w/calcif, er/pr +, her2 -   BREAST SURGERY     lumpectomy   CATARACT EXTRACTION     both eyes   COSMETIC SURGERY  1989   ELECTROPHYSIOLOGIC STUDY N/A 05/10/2015   Procedure: SVT Ablation;  Surgeon: Will Gladis Norton, MD;  Location: [facility] INVASIVE CV LAB;  Service: Cardiovascular;  Laterality: N/A;   HERNIA REPAIR     inguinal herniorrhapy  02/02/1982   left   IR US  GUIDE BX ASP/DRAIN  05/26/2019   rectal fissure repair     s/p benign breast biopsy  02/02/2001   right   s/p left foot surgury  02/03/2007   s/p lumpectomy  02/03/1995   melignant left x 2   spiral fx left foot  02/02/2006   no surgury   TMJ ARTHROPLASTY  02/03/1987   TONGUE SURGERY     1988- to remove scar tissue growth    TONSILLECTOMY      SOCIAL HISTORY: Social History   Socioeconomic History   Marital status: Divorced    Spouse name: 2 Step-children   Number of children: 2   Years of education: Not on file   Highest education level: Bachelor's degree (e.g., BA, AB, BS)  Occupational History   Occupation: retired training and development officer  Tobacco Use   Smoking status: Former    Current packs/day: 0.00    Average packs/day: 1 pack/day for 54.0 years (54.0 ttl pk-yrs)    Types: Cigarettes    Start date: 01/02/1962    Quit date: 01/03/2016  Years since quitting: 7.9   Smokeless tobacco: Never  Vaping Use   Vaping status: Never Used   Substance and Sexual Activity   Alcohol use: Yes    Comment: rare/ drinks socially   Drug use: No   Sexual activity: Never  Other Topics Concern   Not on file  Social History Narrative   Patient gets no regular exercise   No biological children   2 step children   Social Drivers of Health   Financial Resource Strain: Low Risk  (08/06/2023)   Overall Financial Resource Strain (CARDIA)    Difficulty of Paying Living Expenses: Not hard at all  Food Insecurity: No Food Insecurity (08/06/2023)   Hunger Vital Sign    Worried About Running Out of Food in the Last Year: Never true    Ran Out of Food in the Last Year: Never true  Transportation Needs: No Transportation Needs (08/06/2023)   PRAPARE - Administrator, Civil Service (Medical): No    Lack of Transportation (Non-Medical): No  Physical Activity: Inactive (08/06/2023)   Exercise Vital Sign    Days of Exercise per Week: 0 days    Minutes of Exercise per Session: Not on file  Stress: No Stress Concern Present (08/06/2023)   [employer] of Occupational Health - Occupational Stress Questionnaire    Feeling of Stress: Not at all  Social Connections: Socially Isolated (08/06/2023)   Social Connection and Isolation Panel    Frequency of Communication with Friends and Family: More than three times a week    Frequency of Social Gatherings with Friends and Family: Patient declined    Attends Religious Services: Never    Database Administrator or Organizations: No    Attends Engineer, Structural: Not on file    Marital Status: Divorced  Intimate Partner Violence: Not At Risk (01/11/2023)   Humiliation, Afraid, Rape, and Kick questionnaire    Fear of Current or Ex-Partner: No    Emotionally Abused: No    Physically Abused: No    Sexually Abused: No    FAMILY HISTORY: Family History  Problem Relation Age of Onset   Hypertension Mother    Stroke Mother    Colon polyps Mother    Diabetes Mother    Pancreatic  cancer Mother 58   Uterine cancer Mother 37   Cancer Mother    Lymphoma Brother        burkitts   Cancer Brother    Lung cancer Paternal Uncle    Lung cancer Maternal Grandmother 70       non-smoker   Lung cancer Maternal Grandfather    Breast cancer Cousin        maternal cousin, dx in her mid 73s   Brain cancer Cousin        maternal cousin's son; dx in his 51s   Testicular cancer Cousin        maternal cousin's son;    Breast cancer Cousin        paternal cousin; dx in her 16s   Alcohol abuse Father    Breast cancer Cousin        paternal cousin's daughter; dx in 58s; neg genetic testing   Asthma Brother    Asthma Brother    Colon cancer Neg Hx    Esophageal cancer Neg Hx    Stomach cancer Neg Hx    Rectal cancer Neg Hx    PHYSICAL EXAMINATION    There were no vitals  filed for this visit.   Telephone visit  LABORATORY DATA:  CBC    Component Value Date/Time   WBC 3.5 (L) 12/10/2023 1316   WBC 3.6 (L) 06/23/2023 1530   RBC 3.07 (L) 12/10/2023 1316   HGB 10.6 (L) 12/10/2023 1316   HGB 12.4 12/09/2015 1408   HCT 30.6 (L) 12/10/2023 1316   HCT 25.8 (L) 07/03/2022 1345   HCT 36.2 12/09/2015 1408   PLT 163 12/10/2023 1316   PLT 238 12/09/2015 1408   MCV 99.7 12/10/2023 1316   MCV 89.8 12/09/2015 1408   MCH 34.5 (H) 12/10/2023 1316   MCHC 34.6 12/10/2023 1316   RDW 13.9 12/10/2023 1316   RDW 13.2 12/09/2015 1408   LYMPHSABS 0.7 12/10/2023 1316   LYMPHSABS 2.1 12/09/2015 1408   MONOABS 0.2 12/10/2023 1316   MONOABS 0.5 12/09/2015 1408   EOSABS 0.1 12/10/2023 1316   EOSABS 0.2 12/09/2015 1408   BASOSABS 0.0 12/10/2023 1316   BASOSABS 0.0 12/09/2015 1408    CMP     Component Value Date/Time   NA 140 12/10/2023 1316   NA 140 12/09/2015 1409   K 4.1 12/10/2023 1316   K 4.2 12/09/2015 1409   CL 109 12/10/2023 1316   CL 107 02/02/2012 0939   CO2 21 (L) 12/10/2023 1316   CO2 25 12/09/2015 1409   GLUCOSE 101 (H) 12/10/2023 1316   GLUCOSE 102  12/09/2015 1409   GLUCOSE 104 (H) 02/02/2012 0939   BUN 26 (H) 12/10/2023 1316   BUN 16.7 12/09/2015 1409   CREATININE 1.50 (H) 12/10/2023 1316   CREATININE 0.8 12/09/2015 1409   CALCIUM  9.5 12/10/2023 1316   CALCIUM  9.4 12/09/2015 1409   PROT 7.9 12/10/2023 1316   PROT 7.0 12/09/2015 1409   ALBUMIN 4.5 12/10/2023 1316   ALBUMIN 3.3 (L) 12/09/2015 1409   AST 18 12/10/2023 1316   AST 20 12/09/2015 1409   ALT 8 12/10/2023 1316   ALT 13 12/09/2015 1409   ALKPHOS 70 12/10/2023 1316   ALKPHOS 54 12/09/2015 1409   BILITOT 0.3 12/10/2023 1316   BILITOT <0.22 12/09/2015 1409   GFRNONAA 34 (L) 12/10/2023 1316   GFRAA >60 10/26/2019 1347   GFRAA >60 05/09/2019 1303       ASSESSMENT and THERAPY PLAN:   Assessment and Plan Assessment & Plan Metastatic breast cancer with ESR1 and PIK3CA mutations ER+ with weak estrogen receptor staining, PR+ with moderate to strong staining, HER2-. Low to intermediate growth rate.   - She had an isolated area of growth in the left supraclavicular LN. She had palliative radiation to this area. - Continue Ibrance  75 mg. - Continue anastrozole . - Continue Faslodex . - Recent PET with complete resolution of hypermetabolic LN. - we discussed about continuing surveillance with repeat imaging in 3 months - She is very happy with the results.   All questions were answered. The patient knows to call the clinic with any problems, questions or concerns. We can certainly see the patient much sooner if necessary.  Total encounter time:30 minutes*in face-to-face visit time, chart review, lab review, care coordination, order entry, and documentation of the encounter time.   *Total Encounter Time as defined by the Centers for Medicare and Medicaid Services includes, in addition to the face-to-face time of a patient visit (documented in the note above) non-face-to-face time: obtaining and reviewing outside history, ordering and reviewing medications, tests or  procedures, care coordination (communications with other health care professionals or caregivers) and documentation in the medical record.

## 2024-01-03 ENCOUNTER — Other Ambulatory Visit: Payer: Self-pay

## 2024-01-03 ENCOUNTER — Encounter: Payer: Self-pay | Admitting: Hematology and Oncology

## 2024-01-05 DIAGNOSIS — H40013 Open angle with borderline findings, low risk, bilateral: Secondary | ICD-10-CM | POA: Diagnosis not present

## 2024-01-05 LAB — OPHTHALMOLOGY REPORT-SCANNED

## 2024-01-07 ENCOUNTER — Inpatient Hospital Stay: Attending: Adult Health

## 2024-01-07 VITALS — BP 154/62 | HR 83 | Resp 16

## 2024-01-07 DIAGNOSIS — C7951 Secondary malignant neoplasm of bone: Secondary | ICD-10-CM

## 2024-01-07 DIAGNOSIS — C50212 Malignant neoplasm of upper-inner quadrant of left female breast: Secondary | ICD-10-CM

## 2024-01-07 DIAGNOSIS — Z170 Estrogen receptor positive status [ER+]: Secondary | ICD-10-CM | POA: Diagnosis not present

## 2024-01-07 DIAGNOSIS — Z79818 Long term (current) use of other agents affecting estrogen receptors and estrogen levels: Secondary | ICD-10-CM | POA: Insufficient documentation

## 2024-01-07 DIAGNOSIS — C50211 Malignant neoplasm of upper-inner quadrant of right female breast: Secondary | ICD-10-CM | POA: Diagnosis present

## 2024-01-07 LAB — CBC WITH DIFFERENTIAL (CANCER CENTER ONLY)
Abs Immature Granulocytes: 0.01 K/uL (ref 0.00–0.07)
Basophils Absolute: 0 K/uL (ref 0.0–0.1)
Basophils Relative: 1 %
Eosinophils Absolute: 0.1 K/uL (ref 0.0–0.5)
Eosinophils Relative: 3 %
HCT: 31.3 % — ABNORMAL LOW (ref 36.0–46.0)
Hemoglobin: 10.6 g/dL — ABNORMAL LOW (ref 12.0–15.0)
Immature Granulocytes: 0 %
Lymphocytes Relative: 23 %
Lymphs Abs: 0.8 K/uL (ref 0.7–4.0)
MCH: 34.4 pg — ABNORMAL HIGH (ref 26.0–34.0)
MCHC: 33.9 g/dL (ref 30.0–36.0)
MCV: 101.6 fL — ABNORMAL HIGH (ref 80.0–100.0)
Monocytes Absolute: 0.3 K/uL (ref 0.1–1.0)
Monocytes Relative: 8 %
Neutro Abs: 2.3 K/uL (ref 1.7–7.7)
Neutrophils Relative %: 65 %
Platelet Count: 180 K/uL (ref 150–400)
RBC: 3.08 MIL/uL — ABNORMAL LOW (ref 3.87–5.11)
RDW: 14.1 % (ref 11.5–15.5)
WBC Count: 3.5 K/uL — ABNORMAL LOW (ref 4.0–10.5)
nRBC: 0 % (ref 0.0–0.2)

## 2024-01-07 LAB — CMP (CANCER CENTER ONLY)
ALT: 7 U/L (ref 0–44)
AST: 24 U/L (ref 15–41)
Albumin: 4.6 g/dL (ref 3.5–5.0)
Alkaline Phosphatase: 69 U/L (ref 38–126)
Anion gap: 14 (ref 5–15)
BUN: 26 mg/dL — ABNORMAL HIGH (ref 8–23)
CO2: 21 mmol/L — ABNORMAL LOW (ref 22–32)
Calcium: 9.4 mg/dL (ref 8.9–10.3)
Chloride: 105 mmol/L (ref 98–111)
Creatinine: 1.54 mg/dL — ABNORMAL HIGH (ref 0.44–1.00)
GFR, Estimated: 33 mL/min — ABNORMAL LOW (ref >60)
Glucose, Bld: 106 mg/dL — ABNORMAL HIGH (ref 70–99)
Potassium: 4.3 mmol/L (ref 3.5–5.1)
Sodium: 139 mmol/L (ref 135–145)
Total Bilirubin: 0.4 mg/dL (ref 0.0–1.2)
Total Protein: 7.8 g/dL (ref 6.5–8.1)

## 2024-01-07 LAB — MAGNESIUM: Magnesium: 2 mg/dL (ref 1.7–2.4)

## 2024-01-07 MED ORDER — FULVESTRANT 250 MG/5ML IM SOSY
500.0000 mg | PREFILLED_SYRINGE | Freq: Once | INTRAMUSCULAR | Status: AC
  Administered 2024-01-07: 500 mg via INTRAMUSCULAR
  Filled 2024-01-07 (×2): qty 10

## 2024-01-08 LAB — CANCER ANTIGEN 27.29: CA 27.29: 45.5 U/mL — ABNORMAL HIGH (ref 0.0–38.6)

## 2024-01-21 ENCOUNTER — Other Ambulatory Visit: Payer: Self-pay

## 2024-01-21 NOTE — Progress Notes (Signed)
 Specialty Pharmacy Refill Coordination Note  Stacey [name] is a 83 y.o. female contacted today regarding refills of specialty medication(s) Palbociclib  (IBRANCE )   Patient requested Delivery   Delivery date: 02/08/24   Verified address: [address], [name]   Medication will be filled on: 02/07/24

## 2024-01-21 NOTE — Progress Notes (Signed)
 Specialty Pharmacy Ongoing Clinical Assessment Note  Stacey [name] is a 83 y.o. female who is being followed by the specialty pharmacy service for RxSp Oncology   Patient's specialty medication(s) reviewed today: Palbociclib  (IBRANCE )   Missed doses in the last 4 weeks: 0   Patient/Caregiver did not have any additional questions or concerns.   Therapeutic benefit summary: Patient is achieving benefit   Adverse events/side effects summary: No adverse events/side effects   Patient's therapy is appropriate to: Continue    Goals Addressed             This Visit's Progress    Stabilization of disease   On track    Patient is on track. Patient will maintain adherence. Per office visit on 11/21, PET scan showed complete resolution of hypermetabolic LN after palliative radiation.          Follow up: 6 months  [facility]

## 2024-01-30 ENCOUNTER — Other Ambulatory Visit: Payer: Self-pay | Admitting: Hematology and Oncology

## 2024-01-31 ENCOUNTER — Encounter: Payer: Self-pay | Admitting: Oncology

## 2024-02-04 ENCOUNTER — Inpatient Hospital Stay (HOSPITAL_BASED_OUTPATIENT_CLINIC_OR_DEPARTMENT_OTHER): Admitting: Hematology and Oncology

## 2024-02-04 ENCOUNTER — Inpatient Hospital Stay: Attending: Adult Health

## 2024-02-04 VITALS — BP 160/61 | HR 92 | Temp 97.9°F | Resp 17 | Wt 141.8 lb

## 2024-02-04 DIAGNOSIS — Z801 Family history of malignant neoplasm of trachea, bronchus and lung: Secondary | ICD-10-CM | POA: Insufficient documentation

## 2024-02-04 DIAGNOSIS — G4710 Hypersomnia, unspecified: Secondary | ICD-10-CM | POA: Diagnosis not present

## 2024-02-04 DIAGNOSIS — Z170 Estrogen receptor positive status [ER+]: Secondary | ICD-10-CM | POA: Diagnosis not present

## 2024-02-04 DIAGNOSIS — C7951 Secondary malignant neoplasm of bone: Secondary | ICD-10-CM

## 2024-02-04 DIAGNOSIS — A419 Sepsis, unspecified organism: Secondary | ICD-10-CM | POA: Diagnosis not present

## 2024-02-04 DIAGNOSIS — Z86718 Personal history of other venous thrombosis and embolism: Secondary | ICD-10-CM | POA: Insufficient documentation

## 2024-02-04 DIAGNOSIS — Z923 Personal history of irradiation: Secondary | ICD-10-CM | POA: Diagnosis not present

## 2024-02-04 DIAGNOSIS — Z79810 Long term (current) use of selective estrogen receptor modulators (SERMs): Secondary | ICD-10-CM | POA: Diagnosis not present

## 2024-02-04 DIAGNOSIS — I509 Heart failure, unspecified: Secondary | ICD-10-CM | POA: Diagnosis not present

## 2024-02-04 DIAGNOSIS — Z79818 Long term (current) use of other agents affecting estrogen receptors and estrogen levels: Secondary | ICD-10-CM | POA: Diagnosis not present

## 2024-02-04 DIAGNOSIS — N183 Chronic kidney disease, stage 3 unspecified: Secondary | ICD-10-CM | POA: Diagnosis not present

## 2024-02-04 DIAGNOSIS — Z87891 Personal history of nicotine dependence: Secondary | ICD-10-CM | POA: Insufficient documentation

## 2024-02-04 DIAGNOSIS — I5033 Acute on chronic diastolic (congestive) heart failure: Secondary | ICD-10-CM | POA: Insufficient documentation

## 2024-02-04 DIAGNOSIS — Z800 Family history of malignant neoplasm of digestive organs: Secondary | ICD-10-CM | POA: Insufficient documentation

## 2024-02-04 DIAGNOSIS — C50211 Malignant neoplasm of upper-inner quadrant of right female breast: Secondary | ICD-10-CM | POA: Diagnosis present

## 2024-02-04 DIAGNOSIS — I130 Hypertensive heart and chronic kidney disease with heart failure and stage 1 through stage 4 chronic kidney disease, or unspecified chronic kidney disease: Secondary | ICD-10-CM | POA: Diagnosis not present

## 2024-02-04 DIAGNOSIS — I890 Lymphedema, not elsewhere classified: Secondary | ICD-10-CM | POA: Insufficient documentation

## 2024-02-04 DIAGNOSIS — Z803 Family history of malignant neoplasm of breast: Secondary | ICD-10-CM | POA: Insufficient documentation

## 2024-02-04 DIAGNOSIS — Z807 Family history of other malignant neoplasms of lymphoid, hematopoietic and related tissues: Secondary | ICD-10-CM | POA: Diagnosis not present

## 2024-02-04 DIAGNOSIS — I700 Atherosclerosis of aorta: Secondary | ICD-10-CM | POA: Diagnosis not present

## 2024-02-04 DIAGNOSIS — Z1732 Human epidermal growth factor receptor 2 negative status: Secondary | ICD-10-CM | POA: Diagnosis not present

## 2024-02-04 DIAGNOSIS — E785 Hyperlipidemia, unspecified: Secondary | ICD-10-CM | POA: Diagnosis not present

## 2024-02-04 DIAGNOSIS — C50212 Malignant neoplasm of upper-inner quadrant of left female breast: Secondary | ICD-10-CM

## 2024-02-04 DIAGNOSIS — Z1721 Progesterone receptor positive status: Secondary | ICD-10-CM | POA: Diagnosis not present

## 2024-02-04 DIAGNOSIS — M8580 Other specified disorders of bone density and structure, unspecified site: Secondary | ICD-10-CM | POA: Insufficient documentation

## 2024-02-04 LAB — CMP (CANCER CENTER ONLY)
ALT: 9 U/L (ref 0–44)
AST: 22 U/L (ref 15–41)
Albumin: 4.5 g/dL (ref 3.5–5.0)
Alkaline Phosphatase: 64 U/L (ref 38–126)
Anion gap: 15 (ref 5–15)
BUN: 25 mg/dL — ABNORMAL HIGH (ref 8–23)
CO2: 19 mmol/L — ABNORMAL LOW (ref 22–32)
Calcium: 9.4 mg/dL (ref 8.9–10.3)
Chloride: 106 mmol/L (ref 98–111)
Creatinine: 1.47 mg/dL — ABNORMAL HIGH (ref 0.44–1.00)
GFR, Estimated: 35 mL/min — ABNORMAL LOW
Glucose, Bld: 91 mg/dL (ref 70–99)
Potassium: 4.2 mmol/L (ref 3.5–5.1)
Sodium: 139 mmol/L (ref 135–145)
Total Bilirubin: 0.4 mg/dL (ref 0.0–1.2)
Total Protein: 7.7 g/dL (ref 6.5–8.1)

## 2024-02-04 LAB — CBC WITH DIFFERENTIAL (CANCER CENTER ONLY)
Abs Immature Granulocytes: 0.01 K/uL (ref 0.00–0.07)
Basophils Absolute: 0 K/uL (ref 0.0–0.1)
Basophils Relative: 1 %
Eosinophils Absolute: 0.1 K/uL (ref 0.0–0.5)
Eosinophils Relative: 3 %
HCT: 29.8 % — ABNORMAL LOW (ref 36.0–46.0)
Hemoglobin: 10.3 g/dL — ABNORMAL LOW (ref 12.0–15.0)
Immature Granulocytes: 0 %
Lymphocytes Relative: 21 %
Lymphs Abs: 0.7 K/uL (ref 0.7–4.0)
MCH: 34.6 pg — ABNORMAL HIGH (ref 26.0–34.0)
MCHC: 34.6 g/dL (ref 30.0–36.0)
MCV: 100 fL (ref 80.0–100.0)
Monocytes Absolute: 0.2 K/uL (ref 0.1–1.0)
Monocytes Relative: 7 %
Neutro Abs: 2.4 K/uL (ref 1.7–7.7)
Neutrophils Relative %: 68 %
Platelet Count: 155 K/uL (ref 150–400)
RBC: 2.98 MIL/uL — ABNORMAL LOW (ref 3.87–5.11)
RDW: 13.7 % (ref 11.5–15.5)
Smear Review: NORMAL
WBC Count: 3.4 K/uL — ABNORMAL LOW (ref 4.0–10.5)
nRBC: 0 % (ref 0.0–0.2)

## 2024-02-04 LAB — MAGNESIUM: Magnesium: 2 mg/dL (ref 1.7–2.4)

## 2024-02-04 MED ORDER — MAGNESIUM SULFATE 2 GM/50ML IV SOLN: 2.0000 g | Freq: Once | INTRAVENOUS | Status: DC

## 2024-02-04 MED ORDER — FULVESTRANT 250 MG/5ML IM SOSY
500.0000 mg | PREFILLED_SYRINGE | Freq: Once | INTRAMUSCULAR | Status: AC
  Administered 2024-02-04: 500 mg via INTRAMUSCULAR
  Filled 2024-02-04: qty 10

## 2024-02-04 NOTE — Progress Notes (Signed)
 [facility] Cancer Follow up:    [name], MD 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   #2 status post 4 cycles of doxorubicin and cyclophosphamide,                          (i) followed by radiation therapy under the care of Dr. Jadine.   #3 received tamoxifen  for a total of seven years    RIGHT BREAST #4  S/P biopsy of the RIGHT breast upper inner quadrant on 11/14/10 showing invasive ductal carcinoma,, grade 2, estrogen receptor 85% and progesterone receptor 57% positive, Ki67 20%, HER2 not amplified.     #5 started neoadjuvant letrozole  in November 2012; switched to tamoxifen  as of February 2016 due to osteopenia concerns   #6  S/P right lumpectomy with sentinel lymph node biopsy on 09/03/11 for a ypT2, ypN1a, stage IIB invasive lobular carcinoma, grade 2,estrogen receptor 97% positive, progesterone receptor 12% positive, with no HER-2 amplification.   #7 status post right breast radiation therapy under the care of Dr. Patrcia from 10/19/2011 to 12/03/2011.    #8 did not meet criteria for genetic testing according to her insurance company.   #9 osteopenia, with a T score of -1.8 on DEXA scan at [facility] 03/07/2013             (a) status post multiple dental extractions and implants             (b) repeat bone density at [facility] 04/02/2015 shows a T score of -2.0   METASTATIC DISEASE: April 2021 (lymph nodes, bone)   #10:  left upper extremity lymphedema led to chest CT scan 05/09/2019 showing a 5.1 cm left subpectoral chest wall mass and thoracic lymphadenopathy              (a) CT biopsy of the chest wall mass 05/25/2019 confirms recurrent breast cancer, strongly estrogen and  progesterone receptor positive, HER-2 not amplified             (b) PET scan 05/30/2019 shows a left subpectoral mass measuring 5.4 cm, with significant regional and mediastinal adenopathy, sclerotic left scapular metastasis, but no liver or lung involvement.             (c) CA 27-29 is moderately informative   #11 anastrozole  started 06/02/2019; palbociclib  added 06/08/2019             (a) palbociclib  taken irregularly for several months secondary to cytopenias             (b) palbociclib  dose decreased to 100 mg daily 21 on 7 off October 2021             (c) CT of the chest with contrast 11/09/2019 shows mild disease progression (despite a continuing drop in the CA 27-29)             (d) fulvestrant  added 11/23/2019             (e) repeat chest CT with contrast 09/09/2020 read as stable.   #12 denosumab /Xgeva  starting 06/08/2019, to be repeated every 3 months due to hypocalcemia             (  a) treatment held after August 2022 dose secondary to ongoing dental issues   #13 genetics testing 06/15/2019 through the [facility] Common Hereditary Cancers Panel found no deleterious mutations in APC, ATM, AXIN2, BARD1, BMPR1A, BRCA1, BRCA2, BRIP1, CDH1, CDKN2A (p14ARF), CDKN2A (p16INK4a), CKD4, CHEK2, CTNNA1, DICER1, EPCAM (Deletion/duplication testing only), GREM1 (promoter region deletion/duplication testing only), KIT, MEN1, MLH1, MSH2, MSH3, MSH6, MUTYH, NBN, NF1, NHTL1, PALB2, PDGFRA, PMS2, POLD1, POLE, PTEN, RAD50, RAD51C, RAD51D, RNF43, SDHB, SDHC, SDHD, SMAD4, SMARCA4. STK11, TP53, TSC1, TSC2, and VHL.  The following genes were evaluated for sequence changes only: SDHA and HOXB13 c.251G>A variant only.  CURRENT THERAPY: Anastrozole , Fulvestrant , Palbociclib   INTERVAL HISTORY:  Discussed the use of AI scribe software for clinical note transcription with the patient, who gave verbal consent to proceed.  History of Present Illness  Stacey [name] is an 84 year old female with metastatic breast  cancer who presents for routine oncology follow-up and ongoing symptoms of fatigue, diarrhea, and deconditioning.  She remains on Ibrance , anastrozole , and receives Faslodex  injections without recent changes to her regimen. A PET scan is scheduled next month for disease monitoring. She has not received influenza or other routine vaccinations this season, despite prior adherence. She was hospitalized for pneumonia and sepsis in October and has not required further hospitalization since.  She reports persistent fatigue and hypersomnolence, often returning to bed after waking and delaying breakfast until late morning. She notes her energy is lower than baseline and describes significant dyspnea, now requiring a wheelchair for longer distances due to decreased functional status. She is awaiting pulmonology evaluation. She has not had a recent chest radiograph and is concerned about possible pulmonary sequelae.  She continues to experience daily diarrhea, though it is improved compared to previous episodes and is less severe. She has chronic hypomagnesemia but denies recent hospitalizations for diarrhea or related complications.  She is currently using two types of ophthalmic drops and is scheduled for left eye laser surgery in two weeks to reduce intraocular pressure, with anticipated discontinuation of one drop post-procedure.  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) 09/24/2023   Aortic atherosclerosis 08/10/2023   Diastolic CHF (HCC) 02/09/2023   CKD (chronic kidney disease), stage III (HCC) 01/12/2023   Orthostatic hypotension 01/12/2023   Acute on chronic diastolic heart failure (HCC) 12/26/2022   Hypokalemia 12/26/2022   Acute on chronic hypoxic respiratory failure (HCC) 12/05/2022   Metabolic acidosis 12/04/2022   Hypocalcemia 12/04/2022   Fatigue 12/04/2022    Pneumonia 12/04/2022   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   DVT (deep venous thrombosis) (HCC) 12/04/2022   Diarrhea 12/04/2022   Sepsis (HCC) 11/17/2022   History of DVT (deep vein thrombosis) 11/17/2022   Current chronic use of systemic steroids 11/17/2022   Tachypnea 11/17/2022   Pulmonary nodule 11/17/2022   Acute deep vein thrombosis (DVT) of femoral vein of right lower extremity (HCC) 04/13/2022   Nasal dryness 02/04/2021   Chronic sinusitis 11/25/2020   Allergic rhinitis 07/08/2020   Hypomagnesemia 06/10/2020   Goals of care, counseling/discussion 06/21/2019   Family history of breast cancer    Family history of pancreatic cancer    Family history of uterine cancer    Family history of lymphoma    Family history of lung cancer    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HCC) 06/02/2019   Recurrent cancer of left breast (HCC)  05/15/2019   Pes anserine bursitis 03/31/2019   HTN (hypertension) 02/10/2019   Hyperglycemia 06/21/2018   Left carpal tunnel syndrome 02/22/2018   Rotator cuff arthropathy of left shoulder 09/20/2017   Arthritis of hand 08/23/2017   Pain in right hand 08/23/2017   Cervical radiculitis 04/09/2017   Left shoulder pain 04/09/2017   DOE (dyspnea on exertion) 05/14/2016   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   Lymphedema of left arm 01/14/2016   Left arm swelling 12/25/2015   SVT (supraventricular tachycardia)    Left-sided carotid artery disease 03/08/2015   Dizziness 01/24/2015   Urinary frequency 01/13/2015   Dizziness and giddiness 01/09/2015   Gallstones 02/21/2013   Malignant neoplasm of upper-inner quadrant of left breast in female, estrogen receptor positive (HCC) 11/28/2012   Muscle cramping 09/12/2012   Right hip pain 09/12/2012   Lower back pain 09/12/2012   COPD GOLD I     Hx of radiation therapy    Right shoulder pain 09/14/2011   Preventative health care 07/29/2010   Skin lesion of  left leg 07/29/2010   Paresthesia 07/29/2010   GERD 03/28/2010   Constipation 03/28/2010   Osteoarthrosis, 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   Anxiety state 05/03/2009   Hyperlipidemia 06/14/2007   FATIGUE 06/14/2007   Anemia 12/17/2006   Diverticulosis of colon 12/17/2006   Cough 12/17/2006   IRRITABLE BOWEL SYNDROME, HX OF 12/17/2006    is allergic to other, tape, aminoglycosides, bacitracin, bee venom, cephalexin, clindamycin/lincomycin, codeine, and nasacort  [triamcinolone ].  MEDICAL HISTORY: Past Medical History:  Diagnosis Date   ANEMIA-NOS    ANXIETY    Blood transfusion without reported diagnosis Nov. 2024   Breast cancer (HCC) 1997 L, 2012 R   s/p chemo/xrt   Cataract 2015   CHF (congestive heart failure) (HCC) Nov. 2024   COPD    resolved   DIVERTICULOSIS, COLON 2008   Dizziness    Emphysema of lung (HCC) NA   Family history of breast cancer    Family history of lung cancer    Family history of lymphoma    Family history of pancreatic cancer    Family history of uterine cancer    GERD    Hx of radiation therapy 10/19/11 -12/03/11   right breast   HYPERLIPIDEMIA    IRRITABLE BOWEL SYNDROME, HX OF    Left-sided carotid artery disease    moderate left ICA stenosis   OSTEOARTHRITIS, HAND    PSVT (paroxysmal supraventricular tachycardia)    symptomatic on event monitor    SURGICAL HISTORY: Past Surgical History:  Procedure Laterality Date   ABDOMINAL HYSTERECTOMY     APPENDECTOMY     BREAST BIOPSY  11/14/2010   r breast: inv, insitu mammary carcinoma w/calcif, er/pr +, her2 -   BREAST SURGERY     lumpectomy   CATARACT EXTRACTION     both eyes   COSMETIC SURGERY  1989   ELECTROPHYSIOLOGIC STUDY N/A 05/10/2015   Procedure: SVT Ablation;  Surgeon: Will Gladis Norton, MD;  Location: [facility] INVASIVE CV [facility];  Service: Cardiovascular;  Laterality: N/A;   HERNIA REPAIR     inguinal herniorrhapy  02/02/1982   left   IR US  GUIDE BX ASP/DRAIN  05/26/2019   rectal fissure repair      s/p benign breast biopsy  02/02/2001   right   s/p left foot surgury  02/03/2007   s/p lumpectomy  02/03/1995   melignant left x 2   spiral fx left foot  02/02/2006  no surgury   TMJ ARTHROPLASTY  02/03/1987   TONGUE SURGERY     1988- to remove scar tissue growth    TONSILLECTOMY      SOCIAL HISTORY: Social History   Socioeconomic History   Marital status: Divorced    Spouse name: 2 Step-children   Number of children: 2   Years of education: Not on file   Highest education level: Bachelor's degree (e.g., BA, AB, BS)  Occupational History   Occupation: retired training and development officer  Tobacco Use   Smoking status: Former    Current packs/day: 0.00    Average packs/day: 1 pack/day for 54.0 years (54.0 ttl pk-yrs)    Types: Cigarettes    Start date: 01/02/1962    Quit date: 01/03/2016    Years since quitting: 8.0   Smokeless tobacco: Never  Vaping Use   Vaping status: Never Used  Substance and Sexual Activity   Alcohol use: Yes    Comment: rare/ drinks socially   Drug use: No   Sexual activity: Never  Other Topics Concern   Not on file  Social History Narrative   Patient gets no regular exercise   No biological children   2 step children   Social Drivers of Health   Tobacco Use: Medium Risk (10/05/2023)   Patient History    Smoking Tobacco Use: Former    Smokeless Tobacco Use: Never    Passive Exposure: Not on Actuary Strain: Low Risk (08/06/2023)   Overall Financial Resource Strain (CARDIA)    Difficulty of Paying Living Expenses: Not hard at all  Food Insecurity: No Food Insecurity (08/06/2023)   Epic    Worried About Radiation Protection Practitioner of Food in the Last Year: Never true    Ran Out of Food in the Last Year: Never true  Transportation Needs: No Transportation Needs (08/06/2023)   Epic    Lack of Transportation (Medical): No    Lack of Transportation (Non-Medical): No  Physical Activity: Inactive (08/06/2023)   Exercise Vital Sign    Days of Exercise per Week: 0  days    Minutes of Exercise per Session: Not on file  Stress: No Stress Concern Present (08/06/2023)   [employer] of Occupational Health - Occupational Stress Questionnaire    Feeling of Stress: Not at all  Social Connections: Socially Isolated (08/06/2023)   Social Connection and Isolation Panel    Frequency of Communication with Friends and Family: More than three times a week    Frequency of Social Gatherings with Friends and Family: Patient declined    Attends Religious Services: Never    Database Administrator or Organizations: No    Attends Engineer, Structural: Not on file    Marital Status: Divorced  Intimate Partner Violence: Not At Risk (01/11/2023)   Humiliation, Afraid, Rape, and Kick questionnaire    Fear of Current or Ex-Partner: No    Emotionally Abused: No    Physically Abused: No    Sexually Abused: No  Depression (PHQ2-9): Low Risk (09/15/2023)   Depression (PHQ2-9)    PHQ-2 Score: 0  Alcohol Screen: Low Risk (08/06/2023)   Alcohol Screen    Last Alcohol Screening Score (AUDIT): 1  Housing: Low Risk (08/06/2023)   Epic    Unable to Pay for Housing in the Last Year: No    Number of Times Moved in the Last Year: 0    Homeless in the Last Year: No  Utilities: Not At Risk (01/11/2023)   [facility] Utilities  Threatened with loss of utilities: No  Health Literacy: Adequate Health Literacy (11/10/2022)   B1300 Health Literacy    Frequency of need for help with medical instructions: Never    FAMILY HISTORY: Family History  Problem Relation Age of Onset   Hypertension Mother    Stroke Mother    Colon polyps Mother    Diabetes Mother    Pancreatic cancer Mother 64   Uterine cancer Mother 43   Cancer Mother    Lymphoma Brother        burkitts   Cancer Brother    Lung cancer Paternal Uncle    Lung cancer Maternal Grandmother 64       non-smoker   Lung cancer Maternal Grandfather    Breast cancer Cousin        maternal cousin, dx in her mid 80s   Brain  cancer Cousin        maternal cousin's son; dx in his 31s   Testicular cancer Cousin        maternal cousin's son;    Breast cancer Cousin        paternal cousin; dx in her 92s   Alcohol abuse Father    Breast cancer Cousin        paternal cousin's daughter; dx in 71s; neg genetic testing   Asthma Brother    Asthma Brother    Colon cancer Neg Hx    Esophageal cancer Neg Hx    Stomach cancer Neg Hx    Rectal cancer Neg Hx    PHYSICAL EXAMINATION    There were no vitals filed for this visit.   Telephone visit  LABORATORY DATA:  CBC    Component Value Date/Time   WBC 3.5 (L) 01/07/2024 1357   WBC 3.6 (L) 06/23/2023 1530   RBC 3.08 (L) 01/07/2024 1357   HGB 10.6 (L) 01/07/2024 1357   HGB 12.4 12/09/2015 1408   HCT 31.3 (L) 01/07/2024 1357   HCT 25.8 (L) 07/03/2022 1345   HCT 36.2 12/09/2015 1408   PLT 180 01/07/2024 1357   PLT 238 12/09/2015 1408   MCV 101.6 (H) 01/07/2024 1357   MCV 89.8 12/09/2015 1408   MCH 34.4 (H) 01/07/2024 1357   MCHC 33.9 01/07/2024 1357   RDW 14.1 01/07/2024 1357   RDW 13.2 12/09/2015 1408   LYMPHSABS 0.8 01/07/2024 1357   LYMPHSABS 2.1 12/09/2015 1408   MONOABS 0.3 01/07/2024 1357   MONOABS 0.5 12/09/2015 1408   EOSABS 0.1 01/07/2024 1357   EOSABS 0.2 12/09/2015 1408   BASOSABS 0.0 01/07/2024 1357   BASOSABS 0.0 12/09/2015 1408    CMP     Component Value Date/Time   NA 139 01/07/2024 1357   NA 140 12/09/2015 1409   K 4.3 01/07/2024 1357   K 4.2 12/09/2015 1409   CL 105 01/07/2024 1357   CL 107 02/02/2012 0939   CO2 21 (L) 01/07/2024 1357   CO2 25 12/09/2015 1409   GLUCOSE 106 (H) 01/07/2024 1357   GLUCOSE 102 12/09/2015 1409   GLUCOSE 104 (H) 02/02/2012 0939   BUN 26 (H) 01/07/2024 1357   BUN 16.7 12/09/2015 1409   CREATININE 1.54 (H) 01/07/2024 1357   CREATININE 0.8 12/09/2015 1409   CALCIUM  9.4 01/07/2024 1357   CALCIUM  9.4 12/09/2015 1409   PROT 7.8 01/07/2024 1357   PROT 7.0 12/09/2015 1409   ALBUMIN 4.6  01/07/2024 1357   ALBUMIN 3.3 (L) 12/09/2015 1409   AST 24 01/07/2024 1357   AST 20  12/09/2015 1409   ALT 7 01/07/2024 1357   ALT 13 12/09/2015 1409   ALKPHOS 69 01/07/2024 1357   ALKPHOS 54 12/09/2015 1409   BILITOT 0.4 01/07/2024 1357   BILITOT <0.22 12/09/2015 1409   GFRNONAA 33 (L) 01/07/2024 1357   GFRAA >60 10/26/2019 1347   GFRAA >60 05/09/2019 1303       ASSESSMENT and THERAPY PLAN:   Assessment and Plan Assessment & Plan Metastatic breast cancer with ESR1 and PIK3CA mutations ER+ with weak estrogen receptor staining, PR+ with moderate to strong staining, HER2-. Low to intermediate growth rate.   - She had an isolated area of growth in the left supraclavicular LN. She had palliative radiation to this area. - She now continues on Ibrance , faslodex  and anastrozole . Therapy tolerated with expected cytopenias. Functional status ECOG 2 with significant fatigue and decreased mobility. - Scheduled PET scan next month to monitor disease status. - Planned follow-up call after PET scan to discuss results. - Arranged future appointments for ongoing management.  Chronic diarrhea Daily diarrhea persists but is improved and less severe. History of hypomagnesemia and prior severe diarrhea noted. Hypomagnesemia has resolved.  Fatigue and physical deconditioning Persistent fatigue, hypersomnolence, decreased energy, significant dyspnea, and reduced functional status likely multifactorial. Physical deconditioning post-pneumonia and sepsis, possible pulmonary injury. Requires wheelchair for mobility. - Discussed possible pulmonary injury and recommended follow-up with pulmonology.   All questions were answered. The patient knows to call the clinic with any problems, questions or concerns. We can certainly see the patient much sooner if necessary.  Total encounter time:30 minutes*in face-to-face visit time, chart review, lab review, care coordination, order entry, and documentation of  the encounter time.   *Total Encounter Time as defined by the Centers for Medicare and Medicaid Services includes, in addition to the face-to-face time of a patient visit (documented in the note above) non-face-to-face time: obtaining and reviewing outside history, ordering and reviewing medications, tests or procedures, care coordination (communications with other health care professionals or caregivers) and documentation in the medical record.

## 2024-02-05 LAB — CANCER ANTIGEN 27.29: CA 27.29: 32.1 U/mL (ref 0.0–38.6)

## 2024-02-07 ENCOUNTER — Telehealth (HOSPITAL_COMMUNITY): Payer: Self-pay

## 2024-02-07 ENCOUNTER — Other Ambulatory Visit (HOSPITAL_COMMUNITY): Payer: Self-pay

## 2024-02-07 ENCOUNTER — Other Ambulatory Visit: Payer: Self-pay

## 2024-02-07 ENCOUNTER — Encounter: Payer: Self-pay | Admitting: Oncology

## 2024-02-07 NOTE — Telephone Encounter (Signed)
 Oral Oncology Patient Advocate Encounter   Was successful in securing patient a $7000 grant from [employer] (PA[employer]) to provide copayment coverage for Ibrance .  This will keep the out of pocket expense at $0.     The billing information is as follows and has been shared with [facility].   RxBin: [id number] PCN:  [id number] Member ID: [id number] Group ID: [id number] Dates of Eligibility: 08/11/2023 through 02/16/2025   Charlott Hamilton,  CPhT-Adv  she/her/hers [place]  [facility] Pharmacy Technician Patient Advocate Specialist III [facility] Phone: [phone]  Fax: [phone] [email or web address]

## 2024-02-08 ENCOUNTER — Other Ambulatory Visit: Payer: Self-pay

## 2024-02-09 ENCOUNTER — Encounter: Payer: Self-pay | Admitting: Oncology

## 2024-02-09 ENCOUNTER — Other Ambulatory Visit (HOSPITAL_COMMUNITY): Payer: Self-pay

## 2024-02-09 ENCOUNTER — Encounter: Payer: Self-pay | Admitting: Internal Medicine

## 2024-02-09 ENCOUNTER — Ambulatory Visit

## 2024-02-09 ENCOUNTER — Ambulatory Visit (INDEPENDENT_AMBULATORY_CARE_PROVIDER_SITE_OTHER): Admitting: Internal Medicine

## 2024-02-09 VITALS — BP 134/78 | HR 88 | Temp 98.1°F | Ht 63.0 in | Wt 144.0 lb

## 2024-02-09 VITALS — BP 134/78 | HR 88 | Ht 63.0 in | Wt 144.0 lb

## 2024-02-09 DIAGNOSIS — Z0000 Encounter for general adult medical examination without abnormal findings: Secondary | ICD-10-CM | POA: Diagnosis not present

## 2024-02-09 DIAGNOSIS — N1831 Chronic kidney disease, stage 3a: Secondary | ICD-10-CM | POA: Diagnosis not present

## 2024-02-09 DIAGNOSIS — I10 Essential (primary) hypertension: Secondary | ICD-10-CM

## 2024-02-09 DIAGNOSIS — E781 Pure hyperglyceridemia: Secondary | ICD-10-CM | POA: Diagnosis not present

## 2024-02-09 DIAGNOSIS — I5030 Unspecified diastolic (congestive) heart failure: Secondary | ICD-10-CM

## 2024-02-09 DIAGNOSIS — C7951 Secondary malignant neoplasm of bone: Secondary | ICD-10-CM

## 2024-02-09 DIAGNOSIS — R739 Hyperglycemia, unspecified: Secondary | ICD-10-CM

## 2024-02-09 DIAGNOSIS — J449 Chronic obstructive pulmonary disease, unspecified: Secondary | ICD-10-CM

## 2024-02-09 DIAGNOSIS — Z23 Encounter for immunization: Secondary | ICD-10-CM

## 2024-02-09 NOTE — Assessment & Plan Note (Signed)
 Lab Results  Component Value Date   LDLCALC 84 07/04/2009   Stable, pt to continue current statin  - diet,wt control

## 2024-02-09 NOTE — Assessment & Plan Note (Signed)
 BP Readings from Last 3 Encounters:  02/09/24 134/78  02/09/24 134/78  02/04/24 (!) 160/61   Stable, pt to continue medical treatment  - diet, wt control

## 2024-02-09 NOTE — Assessment & Plan Note (Signed)
Stable, cont current med tx

## 2024-02-09 NOTE — Progress Notes (Addendum)
 "  Chief Complaint  Patient presents with   Medicare Wellness     Subjective:   Stacey [name] is a 84 y.o. female who presents for a Medicare Annual Wellness Visit.  Visit info / Clinical Intake: Medicare Wellness Visit Type:: Subsequent Annual Wellness Visit Persons participating in visit and providing information:: patient Medicare Wellness Visit Mode:: In-person (required for WTM) Interpreter Needed?: No Pre-visit prep was completed: yes AWV questionnaire completed by patient prior to visit?: yes Date:: 02/09/24 Living arrangements:: (!) lives alone Patient's Overall Health Status Rating: (!) fair Typical amount of pain: none Does pain affect daily life?: no Are you currently prescribed opioids?: no  Dietary Habits and Nutritional Risks How many meals a day?: 3 Eats fruit and vegetables daily?: yes Most meals are obtained by: preparing own meals In the last 2 weeks, have you had any of the following?: none Diabetic:: no  Functional Status Activities of Daily Living (to include ambulation/medication): Independent Ambulation: Independent with device- listed below Home Assistive Devices/Equipment: Eyeglasses; Cane Medication Administration: Independent Home Management (perform basic housework or laundry): Independent Manage your own finances?: yes Primary transportation is: driving Concerns about vision?: no *vision screening is required for WTM* Concerns about hearing?: no  Fall Screening Falls in the past year?: 0 Number of falls in past year: 0 Was there an injury with Fall?: 0 Fall Risk Category Calculator: 0 Patient Fall Risk Level: Low Fall Risk  Fall Risk Patient at Risk for Falls Due to: No Fall Risks; Impaired balance/gait Fall risk Follow up: Falls evaluation completed; Falls prevention discussed  Home and Transportation Safety: All rugs have non-skid backing?: N/A, no rugs All stairs or steps have railings?: N/A, no stairs Grab bars in the bathtub or  shower?: yes Have non-skid surface in bathtub or shower?: yes Good home lighting?: yes Regular seat belt use?: yes Hospital stays in the last year:: no  Cognitive Assessment Difficulty concentrating, remembering, or making decisions? : no Will 6CIT or Mini Cog be Completed: yes What year is it?: 0 points What month is it?: 0 points Give patient an address phrase to remember (5 components): [address] [place], [place] About what time is it?: 0 points Count backwards from 20 to 1: 0 points Say the months of the year in reverse: 0 points Repeat the address phrase from earlier: 2 points (27) 6 CIT Score: 2 points  Advance Directives (For Healthcare) Does Patient Have a Medical Advance Directive?: Yes Does patient want to make changes to medical advance directive?: Yes (Inpatient - patient requests chaplain consult to change a medical advance directive) Type of Advance Directive: Healthcare Power of Diaz; Living will Copy of Healthcare Power of Attorney in Chart?: No - copy requested Copy of Living Will in Chart?: No - copy requested Would patient like information on creating a medical advance directive?: No - Patient declined  Reviewed/Updated  Reviewed/Updated: Reviewed All (Medical, Surgical, Family, Medications, Allergies, Care Teams, Patient Goals)    Allergies (verified) Other, Tape, Aminoglycosides, Bacitracin, Bee venom, Cephalexin, Clindamycin/lincomycin, Codeine, and Nasacort  [triamcinolone ]   Current Medications (verified) Outpatient Encounter Medications as of 02/09/2024  Medication Sig   acetaminophen  (TYLENOL ) 500 MG tablet Take 1,000 mg by mouth every 8 (eight) hours as needed for mild pain (pain score 1-3) or moderate pain (pain score 4-6).   anastrozole  (ARIMIDEX ) 1 MG tablet TAKE 1 TABLET(1 MG) BY MOUTH DAILY   bimatoprost (LUMIGAN) 0.01 % SOLN 1 drop at bedtime.   CALCIUM  CARBONATE-VITAMIN D  PO Take 1  tablet by mouth 2 (two) times daily.   cholecalciferol  (VITAMIN D3) 25 MCG (1000 UNIT) tablet Take 1,000 Units by mouth daily.   dorzolamide (TRUSOPT) 2 % ophthalmic solution Place 1 drop into the left eye 2 (two) times daily.   ELIQUIS  5 MG TABS tablet TAKE 1 TABLET(5 MG) BY MOUTH TWICE DAILY   loperamide  (IMODIUM  A-D) 2 MG tablet Take 2 mg by mouth 4 (four) times daily as needed for diarrhea or loose stools.   loratadine  (CLARITIN ) 10 MG tablet Take 10 mg by mouth every morning.   Multiple Vitamins-Minerals (MULTIVITAMIN WITH MINERALS) tablet Take 1 tablet by mouth in the morning.   omeprazole  (PRILOSEC) 40 MG capsule Take 1 capsule (40 mg total) by mouth daily.   palbociclib  (IBRANCE ) 75 MG tablet Take 1 tablet (75 mg total) by mouth daily. Take for 21 days on, 7 days off, repeat every 28 days.   vitamin B-12 (CYANOCOBALAMIN ) 100 MCG tablet Take 100 mcg by mouth daily.   zolpidem  (AMBIEN ) 10 MG tablet TAKE 1 TABLET(10 MG) BY MOUTH AT BEDTIME AS NEEDED FOR SLEEP   furosemide  (LASIX ) 40 MG tablet Take 1 tablet (40 mg total) by mouth daily as needed for up to 3 days for fluid or edema (dyspnea, weight gain of 3 lbs in 24 hours). (Patient not taking: Reported on 02/09/2024)   No facility-administered encounter medications on file as of 02/09/2024.    History: Past Medical History:  Diagnosis Date   ANEMIA-NOS    ANXIETY    Blood transfusion without reported diagnosis Nov. 2024   Breast cancer (HCC) 1997 L, 2012 R   s/p chemo/xrt   Cataract 2015   CHF (congestive heart failure) (HCC) Nov. 2024   COPD    resolved   DIVERTICULOSIS, COLON 2008   Dizziness    Emphysema of lung (HCC) NA   Family history of breast cancer    Family history of lung cancer    Family history of lymphoma    Family history of pancreatic cancer    Family history of uterine cancer    GERD    Hx of radiation therapy 10/19/11 -12/03/11   right breast   HYPERLIPIDEMIA    IRRITABLE BOWEL SYNDROME, HX OF    Left-sided carotid artery disease    moderate left ICA stenosis    OSTEOARTHRITIS, HAND    PSVT (paroxysmal supraventricular tachycardia)    symptomatic on event monitor   Past Surgical History:  Procedure Laterality Date   ABDOMINAL HYSTERECTOMY     APPENDECTOMY     BREAST BIOPSY  11/14/2010   r breast: inv, insitu mammary carcinoma w/calcif, er/pr +, her2 -   BREAST SURGERY     lumpectomy   CATARACT EXTRACTION     both eyes   COSMETIC SURGERY  1989   ELECTROPHYSIOLOGIC STUDY N/A 05/10/2015   Procedure: SVT Ablation;  Surgeon: Will Gladis Norton, MD;  Location: [facility];  Service: Cardiovascular;  Laterality: N/A;   HERNIA REPAIR     inguinal herniorrhapy  02/02/1982   left   IR US  GUIDE BX ASP/DRAIN  05/26/2019   rectal fissure repair     s/p benign breast biopsy  02/02/2001   right   s/p left foot surgury  02/03/2007   s/p lumpectomy  02/03/1995   melignant left x 2   spiral fx left foot  02/02/2006   no surgury   TMJ ARTHROPLASTY  02/03/1987   TONGUE SURGERY     1988- to  remove scar tissue growth    TONSILLECTOMY     Family History  Problem Relation Age of Onset   Hypertension Mother    Stroke Mother    Colon polyps Mother    Diabetes Mother    Pancreatic cancer Mother 83   Uterine cancer Mother 3   Cancer Mother    Lymphoma Brother        burkitts   Cancer Brother    Lung cancer Paternal Uncle    Lung cancer Maternal Grandmother 56       non-smoker   Lung cancer Maternal Grandfather    Breast cancer Cousin        maternal cousin, dx in her mid 20s   Brain cancer Cousin        maternal cousin's son; dx in his 1s   Testicular cancer Cousin        maternal cousin's son;    Breast cancer Cousin        paternal cousin; dx in her 89s   Alcohol abuse Father    Breast cancer Cousin        paternal cousin's daughter; dx in 84s; neg genetic testing   Asthma Brother    Asthma Brother    Colon cancer Neg Hx    Esophageal cancer Neg Hx    Stomach cancer Neg Hx    Rectal cancer Neg Hx    Social History    Occupational History   Occupation: retired training and development officer  Tobacco Use   Smoking status: Former    Current packs/day: 0.00    Average packs/day: 1 pack/day for 54.0 years (54.0 ttl pk-yrs)    Types: Cigarettes    Start date: 01/02/1962    Quit date: 01/03/2016    Years since quitting: 8.1   Smokeless tobacco: Never  Vaping Use   Vaping status: Never Used  Substance and Sexual Activity   Alcohol use: Yes    Alcohol/week: 1.0 standard drink of alcohol    Types: 1 Glasses of wine per week    Comment: rare/ drinks socially   Drug use: No   Sexual activity: Not Currently   Tobacco Counseling Counseling given: Yes  SDOH Screenings   Food Insecurity: No Food Insecurity (02/09/2024)  Housing: Low Risk (02/09/2024)  Transportation Needs: No Transportation Needs (02/09/2024)  Utilities: Not At Risk (02/09/2024)  Alcohol Screen: Low Risk (02/07/2024)  Depression (PHQ2-9): Low Risk (02/09/2024)  Financial Resource Strain: Low Risk (02/07/2024)  Physical Activity: Inactive (02/09/2024)  Social Connections: Socially Isolated (02/09/2024)  Stress: No Stress Concern Present (02/09/2024)  Tobacco Use: Medium Risk (02/09/2024)  Health Literacy: Adequate Health Literacy (02/09/2024)   See flowsheets for full screening details  Depression Screen PHQ 2 & 9 Depression Scale- Over the past 2 weeks, how often have you been bothered by any of the following problems? Little interest or pleasure in doing things: 0 Feeling down, depressed, or hopeless (PHQ Adolescent also includes...irritable): 1 PHQ-2 Total Score: 1 Trouble falling or staying asleep, or sleeping too much: 0 Feeling tired or having little energy: 0 Poor appetite or overeating (PHQ Adolescent also includes...weight loss): 0 Feeling bad about yourself - or that you are a failure or have let yourself or your family down: 0 Trouble concentrating on things, such as reading the newspaper or watching television (PHQ Adolescent also includes...like school work):  0 Moving or speaking so slowly that other people could have noticed. Or the opposite - being so fidgety or restless that you have been moving  around a lot more than usual: 3 (uses a cane sometimes) Thoughts that you would be better off dead, or of hurting yourself in some way: 0 PHQ-9 Total Score: 4 If you checked off any problems, how difficult have these problems made it for you to do your work, take care of things at home, or get along with other people?: Not difficult at all  Depression Treatment Depression Interventions/Treatment : [id number] Score <4 Follow-up Not Indicated     Goals Addressed               This Visit's Progress     Patient Stated        Patient Stated (pt-stated)        Patient stated she plans to continue managing her health             Objective:    Today's Vitals   02/09/24 1554  BP: 134/78  Pulse: 88  SpO2: 98%  Weight: 144 lb (65.3 kg)  Height: 5' 3 (1.6 m)   Body mass index is 25.51 kg/m.  Hearing/Vision screen Hearing Screening - Comments:: Denies hearing difficulties   Vision Screening - Comments:: Wears rx glasses - up to date with routine eye exams with [facility] Immunizations and Health Maintenance Health Maintenance  Topic Date Due   Zoster Vaccines- Shingrix (1 of 2) Never done   Bone Density Scan  04/01/2017   Influenza Vaccine  09/03/2023   Medicare Annual Wellness (AWV)  02/08/2025   DTaP/Tdap/Td (3 - Td or Tdap) 02/09/2029   Pneumococcal Vaccine: 50+ Years  Completed   Meningococcal B Vaccine  Aged Out   COVID-19 Vaccine  Discontinued        Assessment/Plan:  This is a routine wellness examination for [facility].  Patient Care Team: [name], MD as PCP - General (Internal Medicine) Patrcia Cough, MD as Consulting Physician (Radiation Oncology) Camella Fallow, MD as Consulting Physician (Orthopedic Surgery) Claudene Arthea HERO, DO as Consulting Physician (Family Medicine) Burkitt Lamarr, MD  as Consulting Physician (Ophthalmology) Crawford, Morna Pickle, NP as Nurse Practitioner (Hematology and Oncology) Court Dorn PARAS, MD as Consulting Physician (Cardiology)  I have personally reviewed and noted the following in the patients chart:   Medical and social history Use of alcohol, tobacco or illicit drugs  Current medications and supplements including opioid prescriptions. Functional ability and status Nutritional status Physical activity Advanced directives List of other physicians Hospitalizations, surgeries, and ER visits in previous 12 months Vitals Screenings to include cognitive, depression, and falls Referrals and appointments  No orders of the defined types were placed in this encounter.  In addition, I have reviewed and discussed with patient certain preventive protocols, quality metrics, and best practice recommendations. A written personalized care plan for preventive services as well as general preventive health recommendations were provided to patient.   Verdie HERO Saba, CMA   02/09/2024   Return in 1 year (on 02/08/2025).  After Visit Summary: (In Person-Declined) Patient declined AVS at this time.  Nurse Notes: scheduled 2027 AWV appt "

## 2024-02-09 NOTE — Progress Notes (Signed)
 Patient ID: Stacey [name], female   DOB: 07/12/1940, 84 y.o.   MRN: [id number]          Chief Complaint:: yearly exam       HPI:  Stacey [name] is a 84 y.o. female here overall doing ok, due for flu shot, Pt denies chest pain, increased sob or doe, wheezing, orthopnea, PND, increased LE swelling, palpitations, dizziness or syncope.   Pt denies polydipsia, polyuria, or new focal neuro s/s.   No longer candidate for ibrance .  To have PET scan soon feb 2026 to reassess.    For shingrx at pharmacy   Wt Readings from Last 3 Encounters:  02/09/24 144 lb (65.3 kg)  02/09/24 144 lb (65.3 kg)  02/04/24 141 lb 12.8 oz (64.3 kg)   BP Readings from Last 3 Encounters:  02/09/24 134/78  02/09/24 134/78  02/04/24 (!) 160/61   Immunization History  Administered Date(s) Administered   Fluad Quad(high Dose 65+) 11/03/2018   INFLUENZA, HIGH DOSE SEASONAL PF 11/16/2013, 11/26/2017   Influenza Split 12/17/2011   Influenza Whole 12/17/2006, 10/23/2008, 10/18/2010   Influenza,inj,Quad PF,6+ Mos 11/19/2020   Influenza-Unspecified 11/02/2012, 11/17/2014, 11/05/2015, 11/03/2019   PFIZER(Purple Top)SARS-COV-2 Vaccination 02/27/2019, 03/27/2019, 09/20/2019, 08/15/2020, 11/19/2020   Pneumococcal Conjugate-13 12/15/2013   Pneumococcal Polysaccharide-23 01/20/2006   Td 06/14/2008   Tdap 02/10/2019   Health Maintenance Due  Topic Date Due   Zoster Vaccines- Shingrix (1 of 2) Never done   Bone Density Scan  04/01/2017   Influenza Vaccine  09/03/2023      Past Medical History:  Diagnosis Date   ANEMIA-NOS    ANXIETY    Blood transfusion without reported diagnosis Nov. 2024   Breast cancer (HCC) 1997 L, 2012 R   s/p chemo/xrt   Cataract 2015   CHF (congestive heart failure) (HCC) Nov. 2024   COPD    resolved   DIVERTICULOSIS, COLON 2008   Dizziness    Emphysema of lung (HCC) NA   Family history of breast cancer    Family history of lung cancer    Family history of lymphoma    Family history of  pancreatic cancer    Family history of uterine cancer    GERD    Hx of radiation therapy 10/19/11 -12/03/11   right breast   HYPERLIPIDEMIA    IRRITABLE BOWEL SYNDROME, HX OF    Left-sided carotid artery disease    moderate left ICA stenosis   OSTEOARTHRITIS, HAND    PSVT (paroxysmal supraventricular tachycardia)    symptomatic on event monitor   Past Surgical History:  Procedure Laterality Date   ABDOMINAL HYSTERECTOMY     APPENDECTOMY     BREAST BIOPSY  11/14/2010   r breast: inv, insitu mammary carcinoma w/calcif, er/pr +, her2 -   BREAST SURGERY     lumpectomy   CATARACT EXTRACTION     both eyes   COSMETIC SURGERY  1989   ELECTROPHYSIOLOGIC STUDY N/A 05/10/2015   Procedure: SVT Ablation;  Surgeon: Will Gladis Norton, MD;  Location: [facility];  Service: Cardiovascular;  Laterality: N/A;   HERNIA REPAIR     inguinal herniorrhapy  02/02/1982   left   IR US  GUIDE BX ASP/DRAIN  05/26/2019   rectal fissure repair     s/p benign breast biopsy  02/02/2001   right   s/p left foot surgury  02/03/2007   s/p lumpectomy  02/03/1995   melignant left x 2   spiral fx left foot  02/02/2006  no surgury   TMJ ARTHROPLASTY  02/03/1987   TONGUE SURGERY     1988- to remove scar tissue growth    TONSILLECTOMY      reports that she quit smoking about 8 years ago. Her smoking use included cigarettes. She started smoking about 62 years ago. She has a 54 pack-year smoking history. She has never used smokeless tobacco. She reports current alcohol use of about 1.0 standard drink of alcohol per week. She reports that she does not use drugs. family history includes Alcohol abuse in her father; Asthma in her brother and brother; Brain cancer in her cousin; Breast cancer in her cousin, cousin, and cousin; Cancer in her brother and mother; Colon polyps in her mother; Diabetes in her mother; Hypertension in her mother; Lung cancer in her maternal grandfather and paternal uncle; Lung cancer  (age of onset: 61) in her maternal grandmother; Lymphoma in her brother; Pancreatic cancer (age of onset: 55) in her mother; Stroke in her mother; Testicular cancer in her cousin; Uterine cancer (age of onset: 76) in her mother. Allergies[1] Medications Ordered Prior to Encounter[2]      ROS:  All others reviewed and negative.  Objective        PE:  BP 134/78 (BP Location: Left Arm, Patient Position: Sitting, Cuff Size: Normal)   Pulse 88   Temp 98.1 F (36.7 C) (Oral)   Ht 5' 3 (1.6 m)   Wt 144 lb (65.3 kg)   SpO2 97%   BMI 25.51 kg/m                 Constitutional: Pt appears in NAD               HENT: Head: NCAT.                Right Ear: External ear normal.                 Left Ear: External ear normal.                Eyes: . Pupils are equal, round, and reactive to light. Conjunctivae and EOM are normal               Nose: without d/c or deformity               Neck: Neck supple. Gross normal ROM               Cardiovascular: Normal rate and regular rhythm.                 Pulmonary/Chest: Effort normal and breath sounds without rales or wheezing.                Abd:  Soft, NT, ND, + BS, no organomegaly               Neurological: Pt is alert. At baseline orientation, motor grossly intact               Skin: Skin is warm. No rashes, no other new lesions, LE edema - none               Psychiatric: Pt behavior is normal without agitation   Micro: none  Cardiac tracings I have personally interpreted today:  none  Pertinent Radiological findings (summarize): none   Lab Results  Component Value Date   WBC 3.4 (L) 02/04/2024   HGB 10.3 (L) 02/04/2024   HCT 29.8 (L) 02/04/2024   PLT  155 02/04/2024   GLUCOSE 91 02/04/2024   CHOL 193 07/13/2018   TRIG 231.0 (H) 07/13/2018   HDL 49.40 07/13/2018   LDLDIRECT 119.0 07/13/2018   LDLCALC 84 07/04/2009   ALT 9 02/04/2024   AST 22 02/04/2024   NA 139 02/04/2024   K 4.2 02/04/2024   CL 106 02/04/2024   CREATININE 1.47 (H)  02/04/2024   BUN 25 (H) 02/04/2024   CO2 19 (L) 02/04/2024   TSH 0.784 12/05/2022   INR 3.2 (H) 12/05/2022   HGBA1C 6.6 (H) 07/13/2018   Assessment/Plan:  Stacey [name] is a 84 y.o. White or Caucasian [1] female with  has a past medical history of ANEMIA-NOS, ANXIETY, Blood transfusion without reported diagnosis (Nov. 2024), Breast cancer [facility]) (1997 L, 2012 R), Cataract (2015), CHF (congestive heart failure) (HCC) (Nov. 2024), COPD, DIVERTICULOSIS, COLON (2008), Dizziness, Emphysema of lung (HCC) (NA), Family history of breast cancer, Family history of lung cancer, Family history of lymphoma, Family history of pancreatic cancer, Family history of uterine cancer, GERD, radiation therapy (10/19/11 -12/03/11), HYPERLIPIDEMIA, IRRITABLE BOWEL SYNDROME, HX OF, Left-sided carotid artery disease, OSTEOARTHRITIS, HAND, and PSVT (paroxysmal supraventricular tachycardia).  Malignant neoplasm metastatic to bone [facility]) Pt for PET scan feb 2026  Diastolic CHF (HCC) Stable, cont current med tx  COPD GOLD I  Stable, cont inhaler asd  Hyperlipidemia Lab Results  Component Value Date   LDLCALC 84 07/04/2009   Stable, pt to continue current statin  - diet,wt control   Hyperglycemia Lab Results  Component Value Date   HGBA1C 6.6 (H) 07/13/2018   Stable, pt to continue current medical treatment  - diet, wt control   HTN (hypertension) BP Readings from Last 3 Encounters:  02/09/24 134/78  02/09/24 134/78  02/04/24 (!) 160/61   Stable, pt to continue medical treatment  - diet, wt control   CKD (chronic kidney disease), stage III (HCC) Lab Results  Component Value Date   CREATININE 1.47 (H) 02/04/2024   Stable overall but moderate to severe, cont to avoid nephrotoxins, pt declines renal referral for now, [id number]  Followup: Return in about 6 months (around 08/08/2024).  Lynwood Rush, MD 02/09/2024 7:01 PM [facility]      [1]  Allergies Allergen Reactions   Other Nausea Only and Other (See Comments)    A lot of antibiotics cause extreme nausea   Tape Other (See Comments)    PAPER TAPE IS MUCH PREFERRED   Aminoglycosides Other (See Comments)    Unknown reaction - pt is not sure where this entry came from   Bacitracin Other (See Comments)    Reaction not recalled   Bee Venom Swelling and Other (See Comments)    Severe body swelling   Cephalexin Nausea Only    Other Reaction(s): Not available   Clindamycin/Lincomycin Diarrhea and Nausea And Vomiting   Codeine Nausea And Vomiting and Other (See Comments)    Pt can take codeine cough syrup   Nasacort  [Triamcinolone ] Other (See Comments)    Caused pressure in her eyes. Pt did not like Nasacort  or anything similar   [2]  Current Outpatient Medications on File Prior to Visit  Medication Sig Dispense Refill   acetaminophen  (TYLENOL ) 500 MG tablet Take 1,000 mg by mouth every 8 (eight) hours as needed for mild pain (pain score 1-3) or moderate pain (pain score 4-6).     anastrozole  (ARIMIDEX ) 1 MG tablet TAKE 1 TABLET(1 MG) BY MOUTH  DAILY 90 tablet 0   bimatoprost (LUMIGAN) 0.01 % SOLN 1 drop at bedtime.     CALCIUM  CARBONATE-VITAMIN D  PO Take 1 tablet by mouth 2 (two) times daily.     cholecalciferol (VITAMIN D3) 25 MCG (1000 UNIT) tablet Take 1,000 Units by mouth daily.     dorzolamide (TRUSOPT) 2 % ophthalmic solution Place 1 drop into the left eye 2 (two) times daily.     ELIQUIS  5 MG TABS tablet TAKE 1 TABLET(5 MG) BY MOUTH TWICE DAILY 90 tablet 1   loperamide  (IMODIUM  A-D) 2 MG tablet Take 2 mg by mouth 4 (four) times daily as needed for diarrhea or loose stools.     loratadine  (CLARITIN ) 10 MG tablet Take 10 mg by mouth every morning.     Multiple Vitamins-Minerals (MULTIVITAMIN WITH MINERALS) tablet Take 1 tablet by mouth in the morning.     omeprazole  (PRILOSEC) 40 MG capsule Take 1 capsule (40 mg total) by mouth daily. 90 capsule 3   palbociclib   (IBRANCE ) 75 MG tablet Take 1 tablet (75 mg total) by mouth daily. Take for 21 days on, 7 days off, repeat every 28 days. 21 tablet 3   vitamin B-12 (CYANOCOBALAMIN ) 100 MCG tablet Take 100 mcg by mouth daily.     zolpidem  (AMBIEN ) 10 MG tablet TAKE 1 TABLET(10 MG) BY MOUTH AT BEDTIME AS NEEDED FOR SLEEP 90 tablet 1   furosemide  (LASIX ) 40 MG tablet Take 1 tablet (40 mg total) by mouth daily as needed for up to 3 days for fluid or edema (dyspnea, weight gain of 3 lbs in 24 hours). (Patient not taking: Reported on 02/09/2024) 10 tablet 0   No current facility-administered medications on file prior to visit.

## 2024-02-09 NOTE — Assessment & Plan Note (Signed)
 Pt for PET scan feb 2026

## 2024-02-09 NOTE — Assessment & Plan Note (Signed)
Stable, cont inhaler asd

## 2024-02-09 NOTE — Patient Instructions (Signed)
 You had the flu shot today  Please continue all other medications as before, and refills have been done if requested.  Please have the pharmacy call with any other refills you may need.  Please continue your efforts at being more active, low cholesterol diet, and weight control.  You are otherwise up to date with prevention measures today.  Please keep your appointments with your specialists as you may have planned  We can hold on more lab testing today  Good Luck with your PET scan!  Please make an Appointment to return in 6 months, or sooner if needed

## 2024-02-09 NOTE — Assessment & Plan Note (Signed)
 Lab Results  Component Value Date   HGBA1C 6.6 (H) 07/13/2018   Stable, pt to continue current medical treatment   - diet, wt control

## 2024-02-09 NOTE — Patient Instructions (Signed)
 Ms. Tiedt,  Thank you for taking the time for your Medicare Wellness Visit. I appreciate your continued commitment to your health goals. Please review the care plan we discussed, and feel free to reach out if I can assist you further.  Please note that Annual Wellness Visits do not include a physical exam. Some assessments may be limited, especially if the visit was conducted virtually. If needed, we may recommend an in-person follow-up with your provider.  Ongoing Care Seeing your primary care provider every 3 to 6 months helps us  monitor your health and provide consistent, personalized care.   Referrals If a referral was made during today's visit and you haven't received any updates within two weeks, please contact the referred provider directly to check on the status.  Recommended Screenings:  Health Maintenance  Topic Date Due   Zoster (Shingles) Vaccine (1 of 2) Never done   Osteoporosis screening with Bone Density Scan  04/01/2017   Flu Shot  09/03/2023   Medicare Annual Wellness Visit  02/08/2025   DTaP/Tdap/Td vaccine (3 - Td or Tdap) 02/09/2029   Pneumococcal Vaccine for age over 63  Completed   Meningitis B Vaccine  Aged Out   COVID-19 Vaccine  Discontinued       02/09/2024    3:57 PM  Advanced Directives  Does Patient Have a Medical Advance Directive? Yes  Type of Estate Agent of [place];Living will  Does patient want to make changes to medical advance directive? Yes (Inpatient - patient requests chaplain consult to change a medical advance directive)  Copy of Healthcare Power of Attorney in Chart? No - copy requested    Vision: Annual vision screenings are recommended for early detection of glaucoma, cataracts, and diabetic retinopathy. These exams can also reveal signs of chronic conditions such as diabetes and high blood pressure.  Dental: Annual dental screenings help detect early signs of oral cancer, gum disease, and other conditions linked  to overall health, including heart disease and diabetes.

## 2024-02-09 NOTE — Assessment & Plan Note (Addendum)
 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

## 2024-02-10 DIAGNOSIS — Z23 Encounter for immunization: Secondary | ICD-10-CM | POA: Diagnosis not present

## 2024-02-10 NOTE — Addendum Note (Signed)
 Addended by: WONDA BETTER on: 02/10/2024 08:03 AM   Modules accepted: Orders

## 2024-02-14 ENCOUNTER — Other Ambulatory Visit (HOSPITAL_COMMUNITY): Payer: Self-pay

## 2024-02-23 ENCOUNTER — Other Ambulatory Visit: Payer: Self-pay | Admitting: Hematology and Oncology

## 2024-02-24 ENCOUNTER — Encounter: Payer: Self-pay | Admitting: Internal Medicine

## 2024-02-24 MED ORDER — ZOLPIDEM TARTRATE 10 MG PO TABS: 10.0000 mg | ORAL_TABLET | Freq: Every evening | ORAL | 1 refills | Status: AC | PRN

## 2024-02-24 MED ORDER — OMEPRAZOLE 40 MG PO CPDR: 40.0000 mg | DELAYED_RELEASE_CAPSULE | Freq: Every day | ORAL | 3 refills | Status: AC

## 2024-02-29 ENCOUNTER — Other Ambulatory Visit: Payer: Self-pay

## 2024-03-01 ENCOUNTER — Ambulatory Visit

## 2024-03-02 ENCOUNTER — Telehealth: Payer: Self-pay | Admitting: Hematology and Oncology

## 2024-03-02 ENCOUNTER — Ambulatory Visit

## 2024-03-02 ENCOUNTER — Other Ambulatory Visit: Payer: Self-pay

## 2024-03-02 NOTE — Progress Notes (Signed)
 Specialty Pharmacy Refill Coordination Note  Stacey [name] is a 84 y.o. female contacted today regarding refills of specialty medication(s) Palbociclib  (IBRANCE )   Patient requested Delivery   Delivery date: 03/09/24   Verified address: [address], [name]   Medication will be filled on: 03/08/24

## 2024-03-02 NOTE — Telephone Encounter (Signed)
 I spoke with patient to reschedule lab and injection from 03/06/2024 to 03/08/2024 due to potential inclement weather.

## 2024-03-06 ENCOUNTER — Inpatient Hospital Stay

## 2024-03-08 ENCOUNTER — Other Ambulatory Visit: Payer: Self-pay

## 2024-03-08 ENCOUNTER — Inpatient Hospital Stay: Attending: Adult Health

## 2024-03-08 ENCOUNTER — Inpatient Hospital Stay

## 2024-03-08 VITALS — BP 142/62 | HR 81 | Temp 98.5°F | Resp 16

## 2024-03-08 DIAGNOSIS — C7951 Secondary malignant neoplasm of bone: Secondary | ICD-10-CM

## 2024-03-08 DIAGNOSIS — Z170 Estrogen receptor positive status [ER+]: Secondary | ICD-10-CM

## 2024-03-08 LAB — CBC WITH DIFFERENTIAL (CANCER CENTER ONLY)
Abs Immature Granulocytes: 0.01 10*3/uL (ref 0.00–0.07)
Basophils Absolute: 0 10*3/uL (ref 0.0–0.1)
Basophils Relative: 2 %
Eosinophils Absolute: 0 10*3/uL (ref 0.0–0.5)
Eosinophils Relative: 1 %
HCT: 31.8 % — ABNORMAL LOW (ref 36.0–46.0)
Hemoglobin: 10.9 g/dL — ABNORMAL LOW (ref 12.0–15.0)
Immature Granulocytes: 0 %
Lymphocytes Relative: 26 %
Lymphs Abs: 0.7 10*3/uL (ref 0.7–4.0)
MCH: 34.6 pg — ABNORMAL HIGH (ref 26.0–34.0)
MCHC: 34.3 g/dL (ref 30.0–36.0)
MCV: 101 fL — ABNORMAL HIGH (ref 80.0–100.0)
Monocytes Absolute: 0.2 10*3/uL (ref 0.1–1.0)
Monocytes Relative: 8 %
Neutro Abs: 1.7 10*3/uL (ref 1.7–7.7)
Neutrophils Relative %: 63 %
Platelet Count: 125 10*3/uL — ABNORMAL LOW (ref 150–400)
RBC: 3.15 MIL/uL — ABNORMAL LOW (ref 3.87–5.11)
RDW: 14.3 % (ref 11.5–15.5)
WBC Count: 2.7 10*3/uL — ABNORMAL LOW (ref 4.0–10.5)
nRBC: 0 % (ref 0.0–0.2)

## 2024-03-08 LAB — CMP (CANCER CENTER ONLY)
ALT: 9 U/L (ref 0–44)
AST: 23 U/L (ref 15–41)
Albumin: 4.8 g/dL (ref 3.5–5.0)
Alkaline Phosphatase: 76 U/L (ref 38–126)
Anion gap: 15 (ref 5–15)
BUN: 19 mg/dL (ref 8–23)
CO2: 22 mmol/L (ref 22–32)
Calcium: 9.8 mg/dL (ref 8.9–10.3)
Chloride: 103 mmol/L (ref 98–111)
Creatinine: 1.24 mg/dL — ABNORMAL HIGH (ref 0.44–1.00)
GFR, Estimated: 43 mL/min — ABNORMAL LOW
Glucose, Bld: 106 mg/dL — ABNORMAL HIGH (ref 70–99)
Potassium: 3.9 mmol/L (ref 3.5–5.1)
Sodium: 140 mmol/L (ref 135–145)
Total Bilirubin: 0.4 mg/dL (ref 0.0–1.2)
Total Protein: 8.5 g/dL — ABNORMAL HIGH (ref 6.5–8.1)

## 2024-03-08 LAB — MAGNESIUM: Magnesium: 1.7 mg/dL (ref 1.7–2.4)

## 2024-03-08 MED ORDER — FULVESTRANT 250 MG/5ML IM SOSY
500.0000 mg | PREFILLED_SYRINGE | Freq: Once | INTRAMUSCULAR | Status: AC
  Administered 2024-03-08: 500 mg via INTRAMUSCULAR
  Filled 2024-03-08: qty 10

## 2024-03-09 LAB — CANCER ANTIGEN 27.29: CA 27.29: 45.5 U/mL — ABNORMAL HIGH (ref 0.0–38.6)

## 2024-03-24 ENCOUNTER — Encounter (HOSPITAL_COMMUNITY)

## 2024-03-27 ENCOUNTER — Ambulatory Visit: Admitting: Cardiology

## 2024-04-05 ENCOUNTER — Inpatient Hospital Stay

## 2024-04-05 ENCOUNTER — Inpatient Hospital Stay: Admitting: Hematology and Oncology

## 2024-04-13 ENCOUNTER — Ambulatory Visit: Admitting: Internal Medicine

## 2024-05-05 ENCOUNTER — Inpatient Hospital Stay

## 2024-06-29 ENCOUNTER — Ambulatory Visit: Admitting: Family Medicine

## 2025-02-12 ENCOUNTER — Ambulatory Visit
